# Patient Record
Sex: Female | Born: 1965
Health system: Southern US, Community
[De-identification: ages and names within clinical notes are randomized; demographics above are authoritative.]

## PROBLEM LIST (undated history)

## (undated) DIAGNOSIS — N189 Chronic kidney disease, unspecified: Secondary | ICD-10-CM

## (undated) DIAGNOSIS — F1910 Other psychoactive substance abuse, uncomplicated: Secondary | ICD-10-CM

## (undated) DIAGNOSIS — C20 Malignant neoplasm of rectum: Secondary | ICD-10-CM

## (undated) DIAGNOSIS — K7460 Unspecified cirrhosis of liver: Secondary | ICD-10-CM

## (undated) DIAGNOSIS — F1010 Alcohol abuse, uncomplicated: Secondary | ICD-10-CM

## (undated) DIAGNOSIS — F1410 Cocaine abuse, uncomplicated: Secondary | ICD-10-CM

## (undated) DIAGNOSIS — F319 Bipolar disorder, unspecified: Secondary | ICD-10-CM

## (undated) DIAGNOSIS — I10 Essential (primary) hypertension: Secondary | ICD-10-CM

## (undated) DIAGNOSIS — E039 Hypothyroidism, unspecified: Secondary | ICD-10-CM

## (undated) DIAGNOSIS — G894 Chronic pain syndrome: Secondary | ICD-10-CM

## (undated) DIAGNOSIS — F32A Depression, unspecified: Secondary | ICD-10-CM

## (undated) DIAGNOSIS — T7840XA Allergy, unspecified, initial encounter: Secondary | ICD-10-CM

## (undated) DIAGNOSIS — F329 Major depressive disorder, single episode, unspecified: Secondary | ICD-10-CM

## (undated) DIAGNOSIS — C801 Malignant (primary) neoplasm, unspecified: Secondary | ICD-10-CM

## (undated) DIAGNOSIS — M1990 Unspecified osteoarthritis, unspecified site: Secondary | ICD-10-CM

## (undated) DIAGNOSIS — Z1379 Encounter for other screening for genetic and chromosomal anomalies: Secondary | ICD-10-CM

## (undated) HISTORY — PX: MANDIBLE FRACTURE SURGERY: SHX706

## (undated) HISTORY — PX: FRACTURE SURGERY: SHX138

## (undated) HISTORY — PX: OTHER SURGICAL HISTORY: SHX169

## (undated) HISTORY — DX: Encounter for other screening for genetic and chromosomal anomalies: Z13.79

## (undated) HISTORY — PX: COLON SURGERY: SHX602

---

## 1999-11-13 ENCOUNTER — Other Ambulatory Visit: Admission: RE | Admit: 1999-11-13 | Discharge: 1999-11-13 | Payer: Self-pay | Admitting: Obstetrics

## 2000-02-02 ENCOUNTER — Inpatient Hospital Stay (HOSPITAL_COMMUNITY): Admission: AD | Admit: 2000-02-02 | Discharge: 2000-02-04 | Payer: Self-pay | Admitting: Obstetrics

## 2000-02-02 ENCOUNTER — Encounter (INDEPENDENT_AMBULATORY_CARE_PROVIDER_SITE_OTHER): Payer: Self-pay | Admitting: Specialist

## 2000-03-19 ENCOUNTER — Inpatient Hospital Stay (HOSPITAL_COMMUNITY): Admission: AD | Admit: 2000-03-19 | Discharge: 2000-03-19 | Payer: Self-pay | Admitting: Obstetrics

## 2000-06-11 ENCOUNTER — Inpatient Hospital Stay (HOSPITAL_COMMUNITY): Admission: AD | Admit: 2000-06-11 | Discharge: 2000-06-11 | Payer: Self-pay | Admitting: Obstetrics

## 2000-09-30 ENCOUNTER — Emergency Department (HOSPITAL_COMMUNITY): Admission: EM | Admit: 2000-09-30 | Discharge: 2000-09-30 | Payer: Self-pay | Admitting: Emergency Medicine

## 2000-11-18 ENCOUNTER — Inpatient Hospital Stay (HOSPITAL_COMMUNITY): Admission: AD | Admit: 2000-11-18 | Discharge: 2000-11-18 | Payer: Self-pay | Admitting: Obstetrics

## 2000-11-20 ENCOUNTER — Emergency Department (HOSPITAL_COMMUNITY): Admission: EM | Admit: 2000-11-20 | Discharge: 2000-11-20 | Payer: Self-pay | Admitting: Emergency Medicine

## 2001-02-19 ENCOUNTER — Emergency Department (HOSPITAL_COMMUNITY): Admission: EM | Admit: 2001-02-19 | Discharge: 2001-02-19 | Payer: Self-pay | Admitting: *Deleted

## 2001-04-18 ENCOUNTER — Encounter: Payer: Self-pay | Admitting: Emergency Medicine

## 2001-04-18 ENCOUNTER — Emergency Department (HOSPITAL_COMMUNITY): Admission: EM | Admit: 2001-04-18 | Discharge: 2001-04-18 | Payer: Self-pay | Admitting: Emergency Medicine

## 2001-08-14 ENCOUNTER — Emergency Department (HOSPITAL_COMMUNITY): Admission: EM | Admit: 2001-08-14 | Discharge: 2001-08-14 | Payer: Self-pay | Admitting: Emergency Medicine

## 2001-09-23 ENCOUNTER — Encounter: Payer: Self-pay | Admitting: Emergency Medicine

## 2001-09-23 ENCOUNTER — Emergency Department (HOSPITAL_COMMUNITY): Admission: EM | Admit: 2001-09-23 | Discharge: 2001-09-23 | Payer: Self-pay | Admitting: Emergency Medicine

## 2002-02-05 ENCOUNTER — Encounter: Payer: Self-pay | Admitting: Emergency Medicine

## 2002-02-05 ENCOUNTER — Inpatient Hospital Stay (HOSPITAL_COMMUNITY): Admission: EM | Admit: 2002-02-05 | Discharge: 2002-02-07 | Payer: Self-pay

## 2002-02-06 ENCOUNTER — Encounter: Payer: Self-pay | Admitting: Internal Medicine

## 2002-04-09 ENCOUNTER — Encounter: Payer: Self-pay | Admitting: Emergency Medicine

## 2002-04-09 ENCOUNTER — Emergency Department (HOSPITAL_COMMUNITY): Admission: EM | Admit: 2002-04-09 | Discharge: 2002-04-10 | Payer: Self-pay | Admitting: Emergency Medicine

## 2002-11-26 ENCOUNTER — Encounter: Payer: Self-pay | Admitting: Emergency Medicine

## 2002-11-26 ENCOUNTER — Emergency Department (HOSPITAL_COMMUNITY): Admission: EM | Admit: 2002-11-26 | Discharge: 2002-11-26 | Payer: Self-pay | Admitting: Emergency Medicine

## 2002-11-28 ENCOUNTER — Emergency Department (HOSPITAL_COMMUNITY): Admission: EM | Admit: 2002-11-28 | Discharge: 2002-11-28 | Payer: Self-pay | Admitting: Emergency Medicine

## 2002-12-20 ENCOUNTER — Emergency Department (HOSPITAL_COMMUNITY): Admission: EM | Admit: 2002-12-20 | Discharge: 2002-12-20 | Payer: Self-pay | Admitting: Emergency Medicine

## 2003-04-26 ENCOUNTER — Emergency Department (HOSPITAL_COMMUNITY): Admission: EM | Admit: 2003-04-26 | Discharge: 2003-04-26 | Payer: Self-pay | Admitting: Emergency Medicine

## 2004-03-09 ENCOUNTER — Emergency Department (HOSPITAL_COMMUNITY): Admission: EM | Admit: 2004-03-09 | Discharge: 2004-03-09 | Payer: Self-pay | Admitting: Emergency Medicine

## 2004-05-30 ENCOUNTER — Emergency Department (HOSPITAL_COMMUNITY): Admission: EM | Admit: 2004-05-30 | Discharge: 2004-05-30 | Payer: Self-pay | Admitting: Emergency Medicine

## 2004-06-23 ENCOUNTER — Emergency Department (HOSPITAL_COMMUNITY): Admission: EM | Admit: 2004-06-23 | Discharge: 2004-06-23 | Payer: Self-pay | Admitting: Internal Medicine

## 2004-06-24 ENCOUNTER — Emergency Department (HOSPITAL_COMMUNITY): Admission: EM | Admit: 2004-06-24 | Discharge: 2004-06-24 | Payer: Self-pay | Admitting: Emergency Medicine

## 2006-07-16 ENCOUNTER — Emergency Department (HOSPITAL_COMMUNITY): Admission: AC | Admit: 2006-07-16 | Discharge: 2006-07-17 | Payer: Self-pay

## 2006-10-21 ENCOUNTER — Emergency Department (HOSPITAL_COMMUNITY): Admission: EM | Admit: 2006-10-21 | Discharge: 2006-10-22 | Payer: Self-pay | Admitting: Emergency Medicine

## 2006-11-13 ENCOUNTER — Emergency Department (HOSPITAL_COMMUNITY): Admission: EM | Admit: 2006-11-13 | Discharge: 2006-11-13 | Payer: Self-pay | Admitting: Emergency Medicine

## 2007-05-30 ENCOUNTER — Emergency Department (HOSPITAL_COMMUNITY): Admission: EM | Admit: 2007-05-30 | Discharge: 2007-05-30 | Payer: Self-pay | Admitting: Emergency Medicine

## 2007-08-03 ENCOUNTER — Emergency Department (HOSPITAL_COMMUNITY): Admission: EM | Admit: 2007-08-03 | Discharge: 2007-08-03 | Payer: Self-pay | Admitting: Emergency Medicine

## 2008-03-14 ENCOUNTER — Emergency Department (HOSPITAL_COMMUNITY): Admission: EM | Admit: 2008-03-14 | Discharge: 2008-03-15 | Payer: Self-pay | Admitting: Emergency Medicine

## 2008-06-10 ENCOUNTER — Emergency Department (HOSPITAL_COMMUNITY): Admission: EM | Admit: 2008-06-10 | Discharge: 2008-06-10 | Payer: Self-pay | Admitting: Emergency Medicine

## 2008-07-12 ENCOUNTER — Emergency Department (HOSPITAL_COMMUNITY): Admission: EM | Admit: 2008-07-12 | Discharge: 2008-07-12 | Payer: Self-pay | Admitting: Emergency Medicine

## 2008-07-31 ENCOUNTER — Emergency Department (HOSPITAL_COMMUNITY): Admission: EM | Admit: 2008-07-31 | Discharge: 2008-08-01 | Payer: Self-pay | Admitting: Emergency Medicine

## 2008-10-05 ENCOUNTER — Emergency Department (HOSPITAL_COMMUNITY): Admission: EM | Admit: 2008-10-05 | Discharge: 2008-10-05 | Payer: Self-pay | Admitting: Emergency Medicine

## 2009-02-17 ENCOUNTER — Emergency Department (HOSPITAL_COMMUNITY): Admission: EM | Admit: 2009-02-17 | Discharge: 2009-02-18 | Payer: Self-pay | Admitting: Emergency Medicine

## 2009-04-19 ENCOUNTER — Emergency Department (HOSPITAL_COMMUNITY): Admission: EM | Admit: 2009-04-19 | Discharge: 2009-04-19 | Payer: Self-pay | Admitting: Emergency Medicine

## 2009-04-27 ENCOUNTER — Emergency Department (HOSPITAL_COMMUNITY): Admission: EM | Admit: 2009-04-27 | Discharge: 2009-04-27 | Payer: Self-pay | Admitting: Emergency Medicine

## 2009-06-08 ENCOUNTER — Emergency Department (HOSPITAL_COMMUNITY): Admission: EM | Admit: 2009-06-08 | Discharge: 2009-06-08 | Payer: Self-pay | Admitting: Emergency Medicine

## 2009-07-23 ENCOUNTER — Emergency Department (HOSPITAL_COMMUNITY): Admission: EM | Admit: 2009-07-23 | Discharge: 2009-07-23 | Payer: Self-pay | Admitting: Emergency Medicine

## 2009-08-20 ENCOUNTER — Emergency Department (HOSPITAL_COMMUNITY): Admission: EM | Admit: 2009-08-20 | Discharge: 2009-08-21 | Payer: Self-pay | Admitting: Emergency Medicine

## 2009-08-21 ENCOUNTER — Inpatient Hospital Stay (HOSPITAL_COMMUNITY): Admission: AD | Admit: 2009-08-21 | Discharge: 2009-08-24 | Payer: Self-pay | Admitting: Psychiatry

## 2009-08-21 ENCOUNTER — Ambulatory Visit: Payer: Self-pay | Admitting: Psychiatry

## 2009-09-03 ENCOUNTER — Emergency Department (HOSPITAL_COMMUNITY): Admission: EM | Admit: 2009-09-03 | Discharge: 2009-09-03 | Payer: Self-pay | Admitting: Emergency Medicine

## 2009-11-02 ENCOUNTER — Emergency Department (HOSPITAL_COMMUNITY): Admission: EM | Admit: 2009-11-02 | Discharge: 2009-11-03 | Payer: Self-pay | Admitting: Emergency Medicine

## 2010-11-24 ENCOUNTER — Emergency Department (HOSPITAL_COMMUNITY)
Admission: EM | Admit: 2010-11-24 | Discharge: 2010-11-24 | Payer: Self-pay | Source: Home / Self Care | Admitting: Emergency Medicine

## 2011-02-05 LAB — COMPREHENSIVE METABOLIC PANEL
ALT: 19 U/L (ref 0–35)
CO2: 27 mEq/L (ref 19–32)
Calcium: 9.1 mg/dL (ref 8.4–10.5)
GFR calc non Af Amer: >60 mL/min (ref >60)
Glucose, Bld: 88 mg/dL (ref 70–99)
Sodium: 141 mEq/L (ref 135–145)

## 2011-02-05 LAB — DIFFERENTIAL
Basophils Relative: 2 % — ABNORMAL HIGH (ref 0–1)
Eosinophils Absolute: 0.1 10*3/uL (ref 0.0–0.7)
Lymphs Abs: 1.7 10*3/uL (ref 0.7–4.0)
Neutrophils Relative %: 59 % (ref 43–77)

## 2011-02-05 LAB — LIPASE, BLOOD: Lipase: 17 U/L (ref 11–59)

## 2011-02-05 LAB — URINALYSIS, ROUTINE W REFLEX MICROSCOPIC
Bilirubin Urine: NEGATIVE
Glucose, UA: NEGATIVE mg/dL
Hgb urine dipstick: NEGATIVE
Ketones, ur: NEGATIVE mg/dL
Protein, ur: NEGATIVE mg/dL

## 2011-02-05 LAB — CBC
HCT: 30.4 % — ABNORMAL LOW (ref 36.0–46.0)
Hemoglobin: 10.3 g/dL — ABNORMAL LOW (ref 12.0–15.0)
MCHC: 33.8 g/dL (ref 30.0–36.0)
MCV: 75 fL — ABNORMAL LOW (ref 78.0–100.0)
RBC: 4.06 MIL/uL (ref 3.87–5.11)

## 2011-02-05 LAB — PREGNANCY, URINE: Preg Test, Ur: NEGATIVE

## 2011-02-05 LAB — PROTIME-INR: Prothrombin Time: 13.6 seconds (ref 11.6–15.2)

## 2011-02-06 LAB — BASIC METABOLIC PANEL
BUN: 7 mg/dL (ref 6–23)
CO2: 23 mEq/L (ref 19–32)
Chloride: 104 mEq/L (ref 96–112)
Creatinine, Ser: 0.63 mg/dL (ref 0.4–1.2)
Glucose, Bld: 113 mg/dL — ABNORMAL HIGH (ref 70–99)

## 2011-02-06 LAB — CBC
MCHC: 32.7 g/dL (ref 30.0–36.0)
MCV: 75 fL — ABNORMAL LOW (ref 78.0–100.0)
Platelets: 206 10*3/uL (ref 150–400)

## 2011-02-06 LAB — DIFFERENTIAL
Basophils Relative: 0 % (ref 0–1)
Eosinophils Absolute: 0.1 10*3/uL (ref 0.0–0.7)
Neutrophils Relative %: 37 % — ABNORMAL LOW (ref 43–77)

## 2011-02-06 LAB — RAPID URINE DRUG SCREEN, HOSP PERFORMED
Barbiturates: NOT DETECTED
Opiates: NOT DETECTED
Tetrahydrocannabinol: NOT DETECTED

## 2011-02-06 LAB — ETHANOL: Alcohol, Ethyl (B): 358 mg/dL — ABNORMAL HIGH (ref 0–10)

## 2011-02-09 LAB — HEPATIC FUNCTION PANEL
ALT: 16 U/L (ref 0–35)
AST: 34 U/L (ref 0–37)
Albumin: 3.8 g/dL (ref 3.5–5.2)
Alkaline Phosphatase: 76 U/L (ref 39–117)
Total Protein: 7.7 g/dL (ref 6.0–8.3)

## 2011-02-09 LAB — URINALYSIS, ROUTINE W REFLEX MICROSCOPIC
Glucose, UA: NEGATIVE mg/dL
Nitrite: NEGATIVE
Protein, ur: NEGATIVE mg/dL
pH: 6 (ref 5.0–8.0)

## 2011-02-09 LAB — DIFFERENTIAL
Basophils Absolute: 0 10*3/uL (ref 0.0–0.1)
Eosinophils Relative: 1 % (ref 0–5)
Lymphocytes Relative: 30 % (ref 12–46)
Lymphs Abs: 1.7 10*3/uL (ref 0.7–4.0)
Monocytes Relative: 6 % (ref 3–12)
Neutrophils Relative %: 63 % (ref 43–77)

## 2011-02-09 LAB — BASIC METABOLIC PANEL
Chloride: 103 mEq/L (ref 96–112)
Creatinine, Ser: 0.64 mg/dL (ref 0.4–1.2)
GFR calc Af Amer: >60 mL/min (ref >60)
GFR calc non Af Amer: >60 mL/min (ref >60)

## 2011-02-09 LAB — CBC
MCV: 74.1 fL — ABNORMAL LOW (ref 78.0–100.0)
Platelets: 180 10*3/uL (ref 150–400)
RBC: 4.48 MIL/uL (ref 3.87–5.11)
WBC: 5.7 10*3/uL (ref 4.0–10.5)

## 2011-02-09 LAB — LACTIC ACID, PLASMA: Lactic Acid, Venous: 2.2 mmol/L (ref 0.5–2.2)

## 2011-02-09 LAB — ABO/RH: ABO/RH(D): O POS

## 2011-02-09 LAB — TYPE AND SCREEN

## 2011-02-09 LAB — POCT PREGNANCY, URINE: Preg Test, Ur: NEGATIVE

## 2011-02-10 LAB — DIFFERENTIAL
Basophils Relative: 1 % (ref 0–1)
Lymphocytes Relative: 38 % (ref 12–46)
Lymphs Abs: 2.2 10*3/uL (ref 0.7–4.0)
Monocytes Absolute: 0.7 10*3/uL (ref 0.1–1.0)
Monocytes Relative: 12 % (ref 3–12)
Neutro Abs: 2.7 10*3/uL (ref 1.7–7.7)
Neutrophils Relative %: 47 % (ref 43–77)

## 2011-02-10 LAB — URINALYSIS, ROUTINE W REFLEX MICROSCOPIC
Leukocytes, UA: NEGATIVE
Nitrite: NEGATIVE
Specific Gravity, Urine: 1.017 (ref 1.005–1.030)
Urobilinogen, UA: 0.2 mg/dL (ref 0.0–1.0)
pH: 5.5 (ref 5.0–8.0)

## 2011-02-10 LAB — CBC
Hemoglobin: 8.6 g/dL — ABNORMAL LOW (ref 12.0–15.0)
RBC: 3.7 MIL/uL — ABNORMAL LOW (ref 3.87–5.11)
WBC: 5.8 10*3/uL (ref 4.0–10.5)

## 2011-02-10 LAB — POCT I-STAT, CHEM 8
BUN: 7 mg/dL (ref 6–23)
Chloride: 107 mEq/L (ref 96–112)
Creatinine, Ser: 0.6 mg/dL (ref 0.4–1.2)
Hemoglobin: 10.2 g/dL — ABNORMAL LOW (ref 12.0–15.0)
Potassium: 4.4 mEq/L (ref 3.5–5.1)
Sodium: 141 mEq/L (ref 135–145)

## 2011-02-10 LAB — URINE MICROSCOPIC-ADD ON

## 2011-02-12 LAB — POCT I-STAT, CHEM 8
Chloride: 107 mEq/L (ref 96–112)
Glucose, Bld: 81 mg/dL (ref 70–99)
HCT: 34 % — ABNORMAL LOW (ref 36.0–46.0)
Hemoglobin: 11.6 g/dL — ABNORMAL LOW (ref 12.0–15.0)
Potassium: 3.6 mEq/L (ref 3.5–5.1)
Sodium: 141 mEq/L (ref 135–145)

## 2011-03-21 NOTE — Discharge Summary (Signed)
[facility]  Patient:    Sherry Baker, Sherry Baker                       MRN: [id number] Adm. Date:  [id number] Disc. Date: 02/04/00 Attending:  [name]                           Discharge Summary  HISTORY OF PRESENT ILLNESS:   The patient is a 45 year old, gravida 4, para 1-0-2-1, [facility] February 09, 2000.  She was admitted in labor with a double footling breech presentation.  HOSPITAL COURSE:              She underwent primary low transverse cesarean section and tubal ligation.  She had a female, Apgars 9 and 9, weighing 6 pounds 8 ounces. Postoperatively, she did well.  Her hemoglobin on admission was 10.6 and post cesarean section was 8.6.  She was discharged home on the second postoperative ay, ambulatory and on a regular diet.  DISCHARGE MEDICATIONS:        Tylenol No.3 one q.4h. p.r.n.  FOLLOW-UP:                    She is to see me in six weeks.  DISCHARGE DIAGNOSIS:          Status post primary low transverse cesarean section and tubal ligation at term for breech presentation in labor. DD:  02/04/00 TD:  02/04/00 Job: [id number]
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
## 2011-03-21 NOTE — Discharge Summary (Signed)
[facility]  Patient:    Sherry Baker, Sherry Baker Visit Number: [id number] MRN: [id number]          Service Type: MED Location: [phone] Attending Physician:  Alfonso Ramus Dictated by:   Harrold Donath, M.D. Admit Date:  02/05/2002 Disc. Date: 02/07/02   CC:         [facility].   Discharge Summary  DISCHARGE DIAGNOSES: 1. Chest pain, resolved. 2. Polysubstance abuse.  DISCHARGE MEDICATIONS: None.  HISTORY OF PRESENT ILLNESS: The patient is a 45 year old female who presented with a 2 day history of chest pain. On admission her alcohol level was noted to be 309 and her UDS was positive for cocaine.  HOSPITAL COURSE:  #1 - CHEST PAIN: The patient was admitted to telemetry bed and was ruled out by cardiac enzymes. Her EKGs both on admission and then day #1 of hospitalization were normal sinus rhythm with no evidence of an acute MI. Her chest pain was resolved by day #1 of hospitalization and she had no further complaints.  #2 - POLYSUBSTANCE ABUSE: The patient was given information on ADS as well as homeless shelters due to the fact that she is homeless by case management. She seems very willing to receive help. She showed no evidence of withdrawal during her hospital stay and was started on an Ativan protocol. She received Ativan p.r.n. for withdrawal.  DISCHARGE CONDITION: To home in stable condition.  INSTRUCTIONS: She was told to call [facility] for an appointment. She was also encouraged to call [facility] for any rehabilitation that she might quality for. Dictated by:   Harrold Donath, M.D. Attending Physician:  Alfonso Ramus DD:  02/07/02 TD:  02/07/02 Job: [id number]

## 2011-04-02 ENCOUNTER — Emergency Department (HOSPITAL_COMMUNITY)
Admission: EM | Admit: 2011-04-02 | Discharge: 2011-04-03 | Disposition: A | Discharge status: Home / Self Care | Payer: Self-pay | Attending: Emergency Medicine | Admitting: Emergency Medicine

## 2011-04-02 DIAGNOSIS — R000 Tachycardia, unspecified: Secondary | ICD-10-CM | POA: Insufficient documentation

## 2011-04-02 DIAGNOSIS — R4789 Other speech disturbances: Secondary | ICD-10-CM | POA: Insufficient documentation

## 2011-04-02 DIAGNOSIS — F1110 Opioid abuse, uncomplicated: Secondary | ICD-10-CM | POA: Insufficient documentation

## 2011-04-02 DIAGNOSIS — F1010 Alcohol abuse, uncomplicated: Secondary | ICD-10-CM | POA: Insufficient documentation

## 2011-04-02 LAB — URINALYSIS, ROUTINE W REFLEX MICROSCOPIC
Hgb urine dipstick: NEGATIVE
Nitrite: NEGATIVE
Protein, ur: NEGATIVE mg/dL
Specific Gravity, Urine: 1.005 (ref 1.005–1.030)
Urobilinogen, UA: 0.2 mg/dL (ref 0.0–1.0)

## 2011-04-02 LAB — CBC
HCT: 33.1 % — ABNORMAL LOW (ref 36.0–46.0)
Hemoglobin: 10.6 g/dL — ABNORMAL LOW (ref 12.0–15.0)
MCHC: 32 g/dL (ref 30.0–36.0)
WBC: 3.8 10*3/uL — ABNORMAL LOW (ref 4.0–10.5)

## 2011-04-02 LAB — DIFFERENTIAL
Basophils Absolute: 0 10*3/uL (ref 0.0–0.1)
Basophils Relative: 1 % (ref 0–1)
Lymphocytes Relative: 44 % (ref 12–46)
Monocytes Absolute: 0.3 10*3/uL (ref 0.1–1.0)
Neutro Abs: 1.7 10*3/uL (ref 1.7–7.7)
Neutrophils Relative %: 46 % (ref 43–77)

## 2011-04-02 LAB — RAPID URINE DRUG SCREEN, HOSP PERFORMED
Amphetamines: NOT DETECTED
Barbiturates: NOT DETECTED
Cocaine: NOT DETECTED
Opiates: NOT DETECTED
Tetrahydrocannabinol: NOT DETECTED

## 2011-04-02 LAB — POCT I-STAT, CHEM 8
HCT: 37 % (ref 36.0–46.0)
Hemoglobin: 12.6 g/dL (ref 12.0–15.0)
Potassium: 3.4 mEq/L — ABNORMAL LOW (ref 3.5–5.1)
Sodium: 141 mEq/L (ref 135–145)
TCO2: 22 mmol/L (ref 0–100)

## 2011-04-02 LAB — POCT PREGNANCY, URINE: Preg Test, Ur: NEGATIVE

## 2011-08-01 LAB — COMPREHENSIVE METABOLIC PANEL
ALT: 45 — ABNORMAL HIGH
Alkaline Phosphatase: 70
BUN: 6
CO2: 23
GFR calc non Af Amer: >60
Glucose, Bld: 81
Potassium: 3.7
Sodium: 139
Total Bilirubin: 0.5

## 2011-08-01 LAB — LIPASE, BLOOD: Lipase: 31

## 2011-08-01 LAB — DIFFERENTIAL
Basophils Absolute: 0
Basophils Relative: 0
Eosinophils Absolute: 0
Monocytes Relative: 10
Neutro Abs: 2.2
Neutrophils Relative %: 42 — ABNORMAL LOW

## 2011-08-01 LAB — CBC
HCT: 33.1 — ABNORMAL LOW
Hemoglobin: 11.2 — ABNORMAL LOW
RBC: 4.29

## 2011-08-01 LAB — URINALYSIS, ROUTINE W REFLEX MICROSCOPIC
Glucose, UA: NEGATIVE
Hgb urine dipstick: NEGATIVE
Ketones, ur: NEGATIVE
pH: 6.5

## 2011-08-01 LAB — POCT PREGNANCY, URINE: Preg Test, Ur: NEGATIVE

## 2011-08-04 LAB — RAPID URINE DRUG SCREEN, HOSP PERFORMED
Amphetamines: NOT DETECTED
Cocaine: NOT DETECTED
Opiates: NOT DETECTED
Tetrahydrocannabinol: NOT DETECTED

## 2011-08-04 LAB — URINALYSIS, ROUTINE W REFLEX MICROSCOPIC
Bilirubin Urine: NEGATIVE
Hgb urine dipstick: NEGATIVE
Specific Gravity, Urine: 1.006
Urobilinogen, UA: 0.2
pH: 6.5

## 2011-08-04 LAB — COMPREHENSIVE METABOLIC PANEL
ALT: 103 — ABNORMAL HIGH
AST: 125 — ABNORMAL HIGH
Calcium: 8.8
GFR calc Af Amer: >60
Sodium: 142
Total Protein: 7.6

## 2011-08-04 LAB — DIFFERENTIAL
Eosinophils Absolute: 0
Eosinophils Relative: 1
Lymphs Abs: 2
Monocytes Relative: 6

## 2011-08-04 LAB — CBC
MCHC: 32.7
Platelets: 205
RDW: 20.6 — ABNORMAL HIGH

## 2011-08-04 LAB — ETHANOL: Alcohol, Ethyl (B): 180 — ABNORMAL HIGH

## 2011-08-06 LAB — COMPREHENSIVE METABOLIC PANEL
ALT: 184 — ABNORMAL HIGH
AST: 133 — ABNORMAL HIGH
Alkaline Phosphatase: 77
CO2: 24
Chloride: 108
Creatinine, Ser: 0.75
GFR calc Af Amer: >60
GFR calc non Af Amer: >60
Potassium: 3.7
Total Bilirubin: 0.5

## 2011-08-06 LAB — GLUCOSE, CAPILLARY: Glucose-Capillary: 72

## 2011-08-06 LAB — RAPID URINE DRUG SCREEN, HOSP PERFORMED
Benzodiazepines: POSITIVE — AB
Cocaine: POSITIVE — AB

## 2011-08-08 LAB — WOUND CULTURE

## 2011-08-14 LAB — CULTURE, ROUTINE-ABSCESS

## 2011-11-05 ENCOUNTER — Emergency Department (HOSPITAL_COMMUNITY)
Admission: EM | Admit: 2011-11-05 | Discharge: 2011-11-06 | Disposition: A | Discharge status: Home / Self Care | Payer: Self-pay | Attending: Internal Medicine | Admitting: Internal Medicine

## 2011-11-05 ENCOUNTER — Encounter: Payer: Self-pay | Admitting: Emergency Medicine

## 2011-11-05 DIAGNOSIS — IMO0002 Reserved for concepts with insufficient information to code with codable children: Secondary | ICD-10-CM | POA: Insufficient documentation

## 2011-11-05 DIAGNOSIS — R4789 Other speech disturbances: Secondary | ICD-10-CM | POA: Insufficient documentation

## 2011-11-05 DIAGNOSIS — F3111 Bipolar disorder, current episode manic without psychotic features, mild: Secondary | ICD-10-CM | POA: Insufficient documentation

## 2011-11-05 DIAGNOSIS — F1010 Alcohol abuse, uncomplicated: Secondary | ICD-10-CM | POA: Insufficient documentation

## 2011-11-05 DIAGNOSIS — R030 Elevated blood-pressure reading, without diagnosis of hypertension: Secondary | ICD-10-CM | POA: Insufficient documentation

## 2011-11-05 LAB — RAPID URINE DRUG SCREEN, HOSP PERFORMED
Amphetamines: NOT DETECTED
Cocaine: NOT DETECTED
Opiates: NOT DETECTED
Tetrahydrocannabinol: NOT DETECTED

## 2011-11-05 LAB — URINALYSIS, ROUTINE W REFLEX MICROSCOPIC
Bilirubin Urine: NEGATIVE
Glucose, UA: NEGATIVE mg/dL
Hgb urine dipstick: NEGATIVE
Protein, ur: NEGATIVE mg/dL
Specific Gravity, Urine: 1.006 (ref 1.005–1.030)
Urobilinogen, UA: 0.2 mg/dL (ref 0.0–1.0)

## 2011-11-05 LAB — CBC
MCH: 22.6 pg — ABNORMAL LOW (ref 26.0–34.0)
MCHC: 30.8 g/dL (ref 30.0–36.0)
MCV: 73.4 fL — ABNORMAL LOW (ref 78.0–100.0)
Platelets: 137 10*3/uL — ABNORMAL LOW (ref 150–400)
RDW: 20.5 % — ABNORMAL HIGH (ref 11.5–15.5)

## 2011-11-05 LAB — COMPREHENSIVE METABOLIC PANEL
ALT: 43 U/L — ABNORMAL HIGH (ref 0–35)
AST: 75 U/L — ABNORMAL HIGH (ref 0–37)
Calcium: 8.9 mg/dL (ref 8.4–10.5)
Creatinine, Ser: 0.49 mg/dL — ABNORMAL LOW (ref 0.50–1.10)
Sodium: 138 mEq/L (ref 135–145)
Total Protein: 7.6 g/dL (ref 6.0–8.3)

## 2011-11-05 LAB — ACETAMINOPHEN LEVEL: Acetaminophen (Tylenol), Serum: <15 ug/mL (ref 10–30)

## 2011-11-05 NOTE — ED Notes (Signed)
Pt alert, presents via [employer], recent death in family, hx depression, stress, denies SI/HI, IVC per [employer]
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
## 2011-11-05 NOTE — ED Notes (Signed)
[employer] x3 at bedside - pt reluctant to change to paper scrubs.

## 2011-11-06 MED ORDER — NICOTINE 21 MG/24HR TD PT24
21.0000 mg | MEDICATED_PATCH | Freq: Every day | TRANSDERMAL | Status: DC
  Filled 2011-11-06: qty 1

## 2011-11-06 MED ORDER — ZIPRASIDONE HCL 40 MG PO CAPS: 40.0000 mg | ORAL_CAPSULE | Freq: Two times a day (BID) | ORAL | Status: DC

## 2011-11-06 MED ORDER — LORAZEPAM 1 MG PO TABS: 1.0000 mg | ORAL_TABLET | Freq: Four times a day (QID) | ORAL | Status: AC | PRN

## 2011-11-06 MED ORDER — ALUM & MAG HYDROXIDE-SIMETH 200-200-20 MG/5ML PO SUSP: 30.0000 mL | ORAL | Status: DC | PRN

## 2011-11-06 MED ORDER — IBUPROFEN 600 MG PO TABS: 600.0000 mg | ORAL_TABLET | Freq: Three times a day (TID) | ORAL | Status: DC | PRN

## 2011-11-06 MED ORDER — ONDANSETRON HCL 4 MG PO TABS: 4.0000 mg | ORAL_TABLET | Freq: Three times a day (TID) | ORAL | Status: DC | PRN

## 2011-11-06 MED ORDER — ZIPRASIDONE HCL 20 MG PO CAPS
40.0000 mg | ORAL_CAPSULE | Freq: Two times a day (BID) | ORAL | Status: DC
  Administered 2011-11-06: 40 mg via ORAL
  Filled 2011-11-06: qty 2

## 2011-11-06 MED ORDER — LORAZEPAM 1 MG PO TABS
2.0000 mg | ORAL_TABLET | Freq: Four times a day (QID) | ORAL | Status: DC | PRN
  Administered 2011-11-06: 2 mg via ORAL
  Filled 2011-11-06: qty 2

## 2011-11-06 NOTE — Progress Notes (Signed)
Rx for ativan and geodon approved and tubed to [facility] pharmacy.  Clovis Fredrickson, RN to be contacted when ready at [phone].
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
## 2011-11-06 NOTE — ED Notes (Signed)
Case Management fills Geodon prescription and sends paper Rx for Ativan home with patient. Meds carefully reviewed with patient by this RN, specifying dosage amounts and times. Pt instructed to NOT take Ativan if she begins drinking again. Departs in stable condition.

## 2011-11-06 NOTE — ED Notes (Signed)
Pt. Sitting in bed watching tv.
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
## 2011-11-06 NOTE — ED Notes (Signed)
Offered pt. A shower, delclined at this time.

## 2011-11-06 NOTE — Progress Notes (Signed)
ED CM contacted by ED SW about medication assistance. Noted self pay pt.  Spoke with Herbert Seta at [facility] pharmacy about [employer]. Pt is eligible for medication assistance. Pending Rx (s) from EDP for processing

## 2011-11-06 NOTE — ED Notes (Signed)
Pt. A/O sitting up in bed watching tv/

## 2011-11-06 NOTE — ED Provider Notes (Signed)
Medical screening examination/treatment/procedure(s) were performed by non-physician practitioner and as supervising physician I was immediately available for consultation/collaboration.  Jasmine Awe, MD 11/06/11 [phone]

## 2011-11-06 NOTE — ED Notes (Signed)
Pt has completed tele-psych consult which has recommended discharge home and rescinded IVC papers. EDP notified and is in agreement with disposition. RN made aware. Pt given community mental health and substance abuse resources. No further needs identified at this time.

## 2011-11-06 NOTE — ED Provider Notes (Signed)
The patient was reassessed after psychiatric consultation, telemetry. Recommendation was to disccharge her on Geodon. The patient is slightly anxious. Now with hypertension that is new since arriving to the emergency department. Review of evaluation indicates that she had no evident renal disease, altered mental status concerning for intracranial abnormality, and no metabolic instability. She is not in hypertensive crisis. Her blood pressure can be managed as an outpatient, expectantly. Consideration was given for potential impending DTs. The patient has never had DTs in the past. At this point. She does not have altered mental status is lucid, and cooperative. She will be additionally, treated with Ativan for anxiety, and given instructions to return for worsening symptoms that could be indicative of DTs. Patient is comfortable with this plan and understands implications. She is lucid at the time of discharge. We were able to secure her 10 days worth of Geodon prescription for her from the outpatient pharmacy, at no cost to her. She is additionally, given a prescription for Ativan, #20, 1 mg to use 3 times a day when necessary anxiety.  Flint Melter, MD 11/06/11 [phone]

## 2011-11-06 NOTE — ED Notes (Signed)
Pt. In the shower .
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
## 2011-11-23 ENCOUNTER — Encounter (HOSPITAL_COMMUNITY): Payer: Self-pay | Admitting: Emergency Medicine

## 2011-11-23 ENCOUNTER — Other Ambulatory Visit: Payer: Self-pay

## 2011-11-23 ENCOUNTER — Emergency Department (HOSPITAL_COMMUNITY)
Admission: EM | Admit: 2011-11-23 | Discharge: 2011-11-23 | Disposition: A | Discharge status: Home / Self Care | Payer: Self-pay | Attending: Emergency Medicine | Admitting: Emergency Medicine

## 2011-11-23 ENCOUNTER — Emergency Department (HOSPITAL_COMMUNITY): Payer: Self-pay

## 2011-11-23 DIAGNOSIS — R209 Unspecified disturbances of skin sensation: Secondary | ICD-10-CM | POA: Insufficient documentation

## 2011-11-23 DIAGNOSIS — F411 Generalized anxiety disorder: Secondary | ICD-10-CM | POA: Insufficient documentation

## 2011-11-23 DIAGNOSIS — R0789 Other chest pain: Secondary | ICD-10-CM | POA: Insufficient documentation

## 2011-11-23 DIAGNOSIS — Z79899 Other long term (current) drug therapy: Secondary | ICD-10-CM | POA: Insufficient documentation

## 2011-11-23 DIAGNOSIS — M79603 Pain in arm, unspecified: Secondary | ICD-10-CM

## 2011-11-23 DIAGNOSIS — M79609 Pain in unspecified limb: Secondary | ICD-10-CM | POA: Insufficient documentation

## 2011-11-23 DIAGNOSIS — I10 Essential (primary) hypertension: Secondary | ICD-10-CM | POA: Insufficient documentation

## 2011-11-23 HISTORY — DX: Essential (primary) hypertension: I10

## 2011-11-23 LAB — DIFFERENTIAL
Basophils Relative: 0 % (ref 0–1)
Eosinophils Absolute: 0.1 10*3/uL (ref 0.0–0.7)
Eosinophils Relative: 2 % (ref 0–5)
Neutrophils Relative %: 53 % (ref 43–77)

## 2011-11-23 LAB — BASIC METABOLIC PANEL
BUN: 5 mg/dL — ABNORMAL LOW (ref 6–23)
Calcium: 9.3 mg/dL (ref 8.4–10.5)
GFR calc Af Amer: >90 mL/min (ref >90)
GFR calc non Af Amer: >90 mL/min (ref >90)
Potassium: 3.2 mEq/L — ABNORMAL LOW (ref 3.5–5.1)
Sodium: 132 mEq/L — ABNORMAL LOW (ref 135–145)

## 2011-11-23 LAB — POCT I-STAT, CHEM 8
BUN: <3 mg/dL — ABNORMAL LOW (ref 6–23)
Calcium, Ion: 1.12 mmol/L (ref 1.12–1.32)
Creatinine, Ser: 0.8 mg/dL (ref 0.50–1.10)
Hemoglobin: 13.3 g/dL (ref 12.0–15.0)
Sodium: 136 mEq/L (ref 135–145)
TCO2: 24 mmol/L (ref 0–100)

## 2011-11-23 LAB — D-DIMER, QUANTITATIVE: D-Dimer, Quant: 0.34 ug/mL-FEU (ref 0.00–0.48)

## 2011-11-23 LAB — CBC
MCH: 23.8 pg — ABNORMAL LOW (ref 26.0–34.0)
MCHC: 32.1 g/dL (ref 30.0–36.0)
MCV: 74.2 fL — ABNORMAL LOW (ref 78.0–100.0)
Platelets: 199 10*3/uL (ref 150–400)
RDW: 19.4 % — ABNORMAL HIGH (ref 11.5–15.5)

## 2011-11-23 LAB — POCT I-STAT TROPONIN I

## 2011-11-23 MED ORDER — POTASSIUM CHLORIDE 20 MEQ/15ML (10%) PO LIQD
ORAL | Status: AC
  Filled 2011-11-23: qty 30

## 2011-11-23 MED ORDER — POTASSIUM CHLORIDE 20 MEQ/15ML (10%) PO LIQD
40.0000 meq | Freq: Once | ORAL | Status: AC
  Administered 2011-11-23: 40 meq via ORAL

## 2011-11-23 MED ORDER — LORAZEPAM 2 MG/ML IJ SOLN
2.0000 mg | Freq: Once | INTRAMUSCULAR | Status: AC
  Administered 2011-11-23: 2 mg via INTRAVENOUS
  Filled 2011-11-23: qty 1

## 2011-11-23 MED ORDER — TRAMADOL HCL 50 MG PO TABS: 50.0000 mg | ORAL_TABLET | Freq: Four times a day (QID) | ORAL | Status: AC | PRN

## 2011-11-23 MED ORDER — IBUPROFEN 600 MG PO TABS: 600.0000 mg | ORAL_TABLET | Freq: Four times a day (QID) | ORAL | Status: AC | PRN

## 2011-11-23 MED ORDER — KETOROLAC TROMETHAMINE 30 MG/ML IJ SOLN
30.0000 mg | Freq: Once | INTRAMUSCULAR | Status: AC
Start: 2011-11-23 — End: 2011-11-23
  Administered 2011-11-23: 30 mg via INTRAVENOUS
  Filled 2011-11-23: qty 1

## 2011-11-23 MED ORDER — SODIUM CHLORIDE 0.9 % IV BOLUS (SEPSIS)
1000.0000 mL | Freq: Once | INTRAVENOUS | Status: AC
  Administered 2011-11-23: 1000 mL via INTRAVENOUS

## 2011-11-23 MED ORDER — MORPHINE SULFATE 4 MG/ML IJ SOLN
4.0000 mg | Freq: Once | INTRAMUSCULAR | Status: AC
  Administered 2011-11-23: 4 mg via INTRAVENOUS
  Filled 2011-11-23: qty 1

## 2011-11-23 NOTE — ED Notes (Signed)
Pt  Stated that she has been having midsternal chest pain radiating to left arm x2 weeks. She is currently complaining pf pain 10 out of 10. No SOB, n/v or diaphoresis. Heart sounds WNL. Pt stated that she has had increase anxiety and stress d/t father being ill. Pt is currently tearful. EDP at bedside. Will continue to monitor.

## 2011-11-23 NOTE — ED Notes (Signed)
Patient transported to X-ray 

## 2011-11-23 NOTE — ED Provider Notes (Signed)
History     CSN: [id number]  Arrival date & time 11/23/11  1656   First MD Initiated Contact with Patient 11/23/11 1738      Chief Complaint  Patient presents with  . Arm Pain    (Consider location/radiation/quality/duration/timing/severity/associated sxs/prior treatment) HPI  46 year old female with history of hypertension and anxiety is presenting to the ED with a chief complaints of left-sided chest pain and pain radiating to her left arm. Patient states for the past 2 weeks she has been experiencing constant sharp pain to her chest and arm. She also noticed tingling sensation to the tip of her fingers. She denies headache, lightheadedness, shortness of breath, back pain, nausea, vomiting, diarrhea, abdominal pain, dysuria, rash. She denies anything that precipitated her pain but states she does a lot of house work. Patient has tried taking ibuprofen once  with some relief.  She denies any recreational drug use but does admits to drinking one beer earlier today.  Patient denies shortness of breath, pleuritic chest pain, calf pain, all neck swelling. She does not taking birth control by mouth.  She denies any recent surgery or prolonged bed rest.  Past Medical History  Diagnosis Date  . Hypertension     History reviewed. No pertinent past surgical history.  No family history on file.  History  Substance Use Topics  . Smoking status: Current Everyday Smoker -- 1.0 packs/day    Types: Cigarettes  . Smokeless tobacco: Not on file  . Alcohol Use: Yes    OB History    Grav Para Term Preterm Abortions TAB SAB Ect Mult Living                  Review of Systems  All other systems reviewed and are negative.    Allergies  Penicillins  Home Medications   Current Outpatient Rx  Name Route Sig Dispense Refill  . LORAZEPAM 1 MG PO TABS Oral Take 1 mg by mouth every 6 (six) hours as needed. For anxiety    . ZIPRASIDONE HCL 40 MG PO CAPS Oral Take 1 capsule (40 mg total) by  mouth 2 (two) times daily with a meal. 20 capsule 0    BP 131/94  Pulse 130  Temp(Src) 98.1 F (36.7 C) (Oral)  Resp 18  SpO2 97%  Physical Exam  Nursing note and vitals reviewed. Constitutional:       Awake, alert, nontoxic appearance  HENT:  Head: Atraumatic.  Eyes: Right eye exhibits no discharge. Left eye exhibits no discharge.  Neck: Neck supple.  Pulmonary/Chest: Effort normal. She exhibits no tenderness.  Abdominal: There is no tenderness. There is no rebound.  Musculoskeletal: She exhibits no tenderness.       Right shoulder: Normal.       Left shoulder: Normal.       Right upper arm: Normal.       Left upper arm: Normal. She exhibits no tenderness.       Baseline ROM, no obvious new focal weakness  Neurological:       Mental status and motor strength appears baseline for patient and situation  Skin: No rash noted.     Psychiatric: She has a normal mood and affect.    ED Course  Procedures (including critical care time)   Labs Reviewed  I-STAT TROPONIN I  CBC  DIFFERENTIAL  BASIC METABOLIC PANEL  POCT I-STAT TROPONIN I   No results found.   No diagnosis found.  7:05 PM  Date: 11/23/2011  Rate: 131  Rhythm: sinus tachycardia  QRS Axis: normal  Intervals: normal  ST/T Wave abnormalities: nonspecific ST changes  Conduction Disutrbances:none  Narrative Interpretation:   Old EKG Reviewed: no significant changes    MDM  This is a patient with anxiety, and hypertension presenting with 2 weeks onset left chest pain and arm pain. Her pain does not suggest cardio pulmonary etiology. Her pain is worsened with movement and with palpation. She is PERC negative.  Low suspicion for PE. However, her pulse is markedly elevated. With a history of anxiety, IV Ativan given along with pain medication. Workup initiated.  7:00 PM Potassium is 3.2, potassium supplementation given in the ED. Sodium 132, IV normal saline given. Patient has negative troponin and normal  chest x-ray. Her heart rate has decreased to baseline. Patient has a negative d-dimer.   8:38 PM Patient state pain has resolved after receiving pain medication. Reassurance given. Patient will be discharged with a short course of pain medication. Patient is instructed for strict followup. Patient voiced understanding and agree with plan.  Fayrene Helper, PA-C 11/23/11 2040

## 2011-11-23 NOTE — ED Provider Notes (Signed)
Medical screening examination/treatment/procedure(s) were conducted as a shared visit with non-physician practitioner(s) and myself.  I personally evaluated the patient during the encounter   Dione Booze, MD 11/23/11 2338

## 2011-11-23 NOTE — ED Notes (Signed)
C/o L arm pain x 2 weeks.  Pt states pain worse with movement.  Pt states she is also having pain in L side of chest.  Denies sob, nausea, and vomiting.

## 2011-11-23 NOTE — ED Provider Notes (Signed)
46 year old female comes in complaining of a two-week history of constant left-sided chest pain. She describes it as sharp and is better she holds her left arm up overhead and worse if she lifts her left arm down. She denies any change in her baseline dyspnea and denies any nausea or vomiting. She has had sweats. She denies any change in chronic cough. Her exam is significant for mild to moderate left-sided chest wall tenderness. She's also noted to be tachycardic. She is somewhat of a poor historian, but given her history of sharp chest pain and tachycardia, she will need to be screened for pulmonary embolism.  Dione Booze, MD 11/23/11 [phone]

## 2012-01-09 ENCOUNTER — Encounter (HOSPITAL_COMMUNITY): Payer: Self-pay

## 2012-01-09 ENCOUNTER — Emergency Department (HOSPITAL_COMMUNITY)
Admission: EM | Admit: 2012-01-09 | Discharge: 2012-01-09 | Disposition: A | Discharge status: Home / Self Care | Payer: Self-pay | Attending: Emergency Medicine | Admitting: Emergency Medicine

## 2012-01-09 DIAGNOSIS — I10 Essential (primary) hypertension: Secondary | ICD-10-CM | POA: Insufficient documentation

## 2012-01-09 DIAGNOSIS — F10229 Alcohol dependence with intoxication, unspecified: Secondary | ICD-10-CM | POA: Insufficient documentation

## 2012-01-09 DIAGNOSIS — IMO0002 Reserved for concepts with insufficient information to code with codable children: Secondary | ICD-10-CM

## 2012-01-09 DIAGNOSIS — Z880 Allergy status to penicillin: Secondary | ICD-10-CM | POA: Insufficient documentation

## 2012-01-09 DIAGNOSIS — K7030 Alcoholic cirrhosis of liver without ascites: Secondary | ICD-10-CM | POA: Insufficient documentation

## 2012-01-09 DIAGNOSIS — F17200 Nicotine dependence, unspecified, uncomplicated: Secondary | ICD-10-CM | POA: Insufficient documentation

## 2012-01-09 HISTORY — DX: Unspecified cirrhosis of liver: K74.60

## 2012-01-09 HISTORY — DX: Alcohol abuse, uncomplicated: F10.10

## 2012-01-09 LAB — URINALYSIS, ROUTINE W REFLEX MICROSCOPIC
Bilirubin Urine: NEGATIVE
Glucose, UA: NEGATIVE mg/dL
Ketones, ur: NEGATIVE mg/dL
Protein, ur: NEGATIVE mg/dL
pH: 6 (ref 5.0–8.0)

## 2012-01-09 NOTE — ED Notes (Signed)
AOZ:[id number]<HQ> Expected date:<BR> Expected time: 4:37 PM<BR> Means of arrival:<BR> Comments:<BR> M11 - 46yoF etoh
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
## 2012-01-09 NOTE — ED Provider Notes (Signed)
History     CSN: [id number]  Arrival date & time 01/09/12  1646   None     Chief Complaint  Patient presents with  . Depression  . Alcohol Intoxication    (Consider location/radiation/quality/duration/timing/severity/associated sxs/prior treatment) Patient is a 46 y.o. female presenting with intoxication. The history is provided by the patient. No language interpreter was used.  Alcohol Intoxication This is a recurrent problem. The current episode started today. The problem occurs constantly. The problem has been gradually improving. Pertinent negatives include no abdominal pain, chest pain, coughing, diaphoresis, headaches, nausea, neck pain, sore throat, swollen glands, urinary symptoms, vertigo, visual change or vomiting. The symptoms are aggravated by drinking. She has tried nothing for the symptoms.   patient was found lying on the curb intoxicated when the EMS picked her. She has been here 3 hours and has eaten and drank. She is able to walk a straight line. She denies suicidal or homicidal ideation. She does not want help with her alcohol problem today. She will a bus pass unlikely. We discussed the situation and she is going to have someone pick her up.  Past Medical History  Diagnosis Date  . Hypertension   . Alcohol abuse   . Cirrhosis of liver     Past Surgical History  Procedure Date  . Mandible fracture surgery     No family history on file.  History  Substance Use Topics  . Smoking status: Current Everyday Smoker -- 1.0 packs/day    Types: Cigarettes  . Smokeless tobacco: Not on file  . Alcohol Use: Yes    OB History    Grav Para Term Preterm Abortions TAB SAB Ect Mult Living                  Review of Systems  Constitutional: Negative for diaphoresis.  HENT: Negative for sore throat and neck pain.   Respiratory: Negative for cough.   Cardiovascular: Negative for chest pain.  Gastrointestinal: Negative for nausea, vomiting and abdominal pain.    Neurological: Negative for vertigo and headaches.  All other systems reviewed and are negative.    Allergies  Penicillins  Home Medications   Current Outpatient Rx  Name Route Sig Dispense Refill  . LORAZEPAM 1 MG PO TABS Oral Take 1 mg by mouth every 6 (six) hours as needed. For anxiety    . ZIPRASIDONE HCL 40 MG PO CAPS Oral Take 1 capsule (40 mg total) by mouth 2 (two) times daily with a meal. 20 capsule 0    There were no vitals taken for this visit.  Physical Exam  Nursing note and vitals reviewed. Constitutional: She is oriented to person, place, and time. She appears well-developed and well-nourished.  HENT:  Head: Normocephalic and atraumatic.  Eyes: Conjunctivae and EOM are normal. Pupils are equal, round, and reactive to light.  Neck: Normal range of motion. Neck supple.  Cardiovascular: Regular rhythm, normal heart sounds and intact distal pulses.   Pulmonary/Chest: Effort normal and breath sounds normal. No respiratory distress. She has no wheezes.  Abdominal: Soft. Bowel sounds are normal.  Musculoskeletal: Normal range of motion.  Neurological: She is alert and oriented to person, place, and time.  Skin: Skin is warm and dry.  Psychiatric: She has a normal mood and affect.    ED Course  Procedures (including critical care time)   Labs Reviewed  URINALYSIS, ROUTINE W REFLEX MICROSCOPIC  PREGNANCY, URINE  URINE RAPID DRUG SCREEN (HOSP PERFORMED)   No results  found.   No diagnosis found.    MDM   46yo female here for alcohol intoxication in public. pmh of hypertension, ETOH abuse and cirrhosis of the liver.  Bystander called EMS because she was laying on a curb. Wants to leave after 4 hours in the ER.  No slurred speech and she is walking a straight line. Eating and drinking in the ER.  Cannot find a ride.  Wants to take the bus.  States she has a safe place to go.  I feel she is sober and is at baseline and able to conduct herself.       Jethro Bastos, NP 01/10/12 1118
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
## 2012-01-10 NOTE — ED Provider Notes (Signed)
Medical screening examination/treatment/procedure(s) were performed by non-physician practitioner and as supervising physician I was immediately available for consultation/collaboration.   Leigh-Ann Joceline Hinchcliff, MD 01/10/12 1553 

## 2012-03-18 ENCOUNTER — Encounter (HOSPITAL_COMMUNITY): Payer: Self-pay | Admitting: Emergency Medicine

## 2012-03-18 ENCOUNTER — Emergency Department (HOSPITAL_COMMUNITY)
Admission: EM | Admit: 2012-03-18 | Discharge: 2012-03-19 | Disposition: A | Discharge status: Home / Self Care | Payer: Self-pay | Attending: Emergency Medicine | Admitting: Emergency Medicine

## 2012-03-18 DIAGNOSIS — K7460 Unspecified cirrhosis of liver: Secondary | ICD-10-CM | POA: Insufficient documentation

## 2012-03-18 DIAGNOSIS — E876 Hypokalemia: Secondary | ICD-10-CM | POA: Insufficient documentation

## 2012-03-18 DIAGNOSIS — K921 Melena: Secondary | ICD-10-CM | POA: Insufficient documentation

## 2012-03-18 DIAGNOSIS — I10 Essential (primary) hypertension: Secondary | ICD-10-CM | POA: Insufficient documentation

## 2012-03-18 DIAGNOSIS — R300 Dysuria: Secondary | ICD-10-CM | POA: Insufficient documentation

## 2012-03-18 DIAGNOSIS — F17200 Nicotine dependence, unspecified, uncomplicated: Secondary | ICD-10-CM | POA: Insufficient documentation

## 2012-03-18 DIAGNOSIS — K644 Residual hemorrhoidal skin tags: Secondary | ICD-10-CM | POA: Insufficient documentation

## 2012-03-18 DIAGNOSIS — K625 Hemorrhage of anus and rectum: Secondary | ICD-10-CM | POA: Insufficient documentation

## 2012-03-18 NOTE — ED Notes (Signed)
[id number]<AV> Expected date:<BR> Expected time:10:55 PM<BR> Means of arrival:<BR> Comments:<BR> M211 - 46yoF Rectal bleeding and painful urination

## 2012-03-18 NOTE — ED Notes (Signed)
As per EMS, pt called ems for chest pain, pt then c/o pain when urinating and blood seen in stool. Pt states the urinating  problem has been going on for years. EMS suspects ETOH

## 2012-03-19 LAB — BASIC METABOLIC PANEL
BUN: 7 mg/dL (ref 6–23)
Calcium: 8.7 mg/dL (ref 8.4–10.5)
Creatinine, Ser: 0.48 mg/dL — ABNORMAL LOW (ref 0.50–1.10)
GFR calc non Af Amer: >90 mL/min (ref >90)
Glucose, Bld: 85 mg/dL (ref 70–99)
Potassium: 3.3 mEq/L — ABNORMAL LOW (ref 3.5–5.1)

## 2012-03-19 LAB — CBC
HCT: 32.4 % — ABNORMAL LOW (ref 36.0–46.0)
Hemoglobin: 10.4 g/dL — ABNORMAL LOW (ref 12.0–15.0)
MCH: 23.5 pg — ABNORMAL LOW (ref 26.0–34.0)
MCHC: 32.1 g/dL (ref 30.0–36.0)
MCV: 73.1 fL — ABNORMAL LOW (ref 78.0–100.0)

## 2012-03-19 LAB — URINALYSIS, DIPSTICK ONLY
Bilirubin Urine: NEGATIVE
Glucose, UA: NEGATIVE mg/dL
Hgb urine dipstick: NEGATIVE
Ketones, ur: NEGATIVE mg/dL
pH: 6 (ref 5.0–8.0)

## 2012-03-19 LAB — URINALYSIS, ROUTINE W REFLEX MICROSCOPIC
Bilirubin Urine: NEGATIVE
Hgb urine dipstick: NEGATIVE
Specific Gravity, Urine: 1.01 (ref 1.005–1.030)
Urobilinogen, UA: 0.2 mg/dL (ref 0.0–1.0)

## 2012-03-19 MED ORDER — POTASSIUM CHLORIDE CRYS ER 20 MEQ PO TBCR
40.0000 meq | EXTENDED_RELEASE_TABLET | Freq: Once | ORAL | Status: AC
  Administered 2012-03-19: 40 meq via ORAL
  Filled 2012-03-19: qty 2

## 2012-03-19 MED ORDER — ACETAMINOPHEN 325 MG PO TABS
650.0000 mg | ORAL_TABLET | Freq: Once | ORAL | Status: AC
  Administered 2012-03-19: 650 mg via ORAL
  Filled 2012-03-19: qty 2

## 2012-03-19 NOTE — ED Provider Notes (Signed)
History     CSN: [id number]  Arrival date & time 03/18/12  2258   None     Chief Complaint  Patient presents with  . Rectal Bleeding    (Consider location/radiation/quality/duration/timing/severity/associated sxs/prior treatment) Patient is a 46 y.o. female presenting with hematochezia. The history is provided by the patient. No language interpreter was used.  Rectal Bleeding  The current episode started more than 2 weeks ago (1 year). The problem occurs occasionally. The problem has been unchanged. The patient is experiencing no pain. The stool is described as soft. There was no prior successful therapy. There was no prior unsuccessful therapy. Associated symptoms include hemorrhoids. Pertinent negatives include no fever, no abdominal pain, no diarrhea, no nausea, no rectal pain, no vomiting, no hematuria and no chest pain. Her past medical history does not include inflammatory bowel disease or recent antibiotic use. Recently, medical care has been given at this facility.   Rectal bleeding today x 3 from her hemmeroids  That has been going on for over 1 year.  Also having burning with urination x several years.  Arrived via ems.  Smells of etoh.  Boyfriend here had argument and left.  No acute distress, dizziness or weakness.  Does not look toxic. pmh bipolar , hypertension, etoh abuse, and cirrhosis of the liver.  Past Medical History  Diagnosis Date  . Hypertension   . Alcohol abuse   . Cirrhosis of liver     Past Surgical History  Procedure Date  . Mandible fracture surgery     No family history on file.  History  Substance Use Topics  . Smoking status: Current Everyday Smoker -- 1.0 packs/day for 10 years    Types: Cigarettes  . Smokeless tobacco: Not on file  . Alcohol Use: Yes    OB History    Grav Para Term Preterm Abortions TAB SAB Ect Mult Living                  Review of Systems  Constitutional: Negative.  Negative for fever.  HENT: Negative.   Eyes:  Negative.   Respiratory: Negative.  Negative for shortness of breath.   Cardiovascular: Negative.  Negative for chest pain and leg swelling.  Gastrointestinal: Positive for blood in stool, hematochezia, anal bleeding and hemorrhoids. Negative for nausea, vomiting, abdominal pain, diarrhea, constipation and rectal pain.  Genitourinary: Positive for dysuria. Negative for frequency and hematuria.  Neurological: Negative.  Negative for dizziness and weakness.  Psychiatric/Behavioral: Negative.        Intoixicated   All other systems reviewed and are negative.    Allergies  Penicillins  Home Medications  No current outpatient prescriptions on file.  BP 123/84  Pulse 100  Temp(Src) 97.7 F (36.5 C) (Oral)  Resp 22  SpO2 94%  Physical Exam  Nursing note and vitals reviewed. Constitutional: She is oriented to person, place, and time. She appears well-developed and well-nourished.  HENT:  Head: Normocephalic.  Eyes: Conjunctivae and EOM are normal. Pupils are equal, round, and reactive to light.  Neck: Normal range of motion. Neck supple.  Cardiovascular: Normal rate.   Pulmonary/Chest: Effort normal and breath sounds normal. No respiratory distress. She has no wheezes.  Abdominal: Soft.  Genitourinary: Rectal exam shows external hemorrhoid. Rectal exam shows no mass, no tenderness and anal tone normal. Guaiac negative stool.  Musculoskeletal: Normal range of motion.  Neurological: She is alert and oriented to person, place, and time.  Skin: Skin is warm and dry.  Psychiatric:  She has a normal mood and affect.    ED Course  Procedures (including critical care time)   Labs Reviewed  URINALYSIS, DIPSTICK ONLY  CBC  BASIC METABOLIC PANEL   No results found.   No diagnosis found.    MDM  Arrived EMS c/o rectal bleeding and dysuria.  Smells of ETOH. Hypokalemia 3.3. Hgb 10.4. Denies dizziness. Ambulating without difficulty.  No UTI. Boyfriend to pick her up. Pt is ready  for discharge.   Labs Reviewed  CBC - Abnormal; Notable for the following:    WBC 3.3 (*)    Hemoglobin 10.4 (*)    HCT 32.4 (*)    MCV 73.1 (*)    MCH 23.5 (*)    RDW 21.2 (*)    Platelets 132 (*)    All other components within normal limits  BASIC METABOLIC PANEL - Abnormal; Notable for the following:    Potassium 3.3 (*)    Creatinine, Ser 0.48 (*)    All other components within normal limits  URINALYSIS, DIPSTICK ONLY  URINALYSIS, ROUTINE W REFLEX MICROSCOPIC  OCCULT BLOOD, POC DEVICE  OCCULT BLOOD X 1 CARD TO LAB, STOOL         Remi Haggard, NP 03/19/12 1623

## 2012-03-19 NOTE — ED Notes (Signed)
Pt refusing to speak to me or answer any questions. Pt alert but will not give me an arm to stick. RN aware.

## 2012-03-19 NOTE — Discharge Instructions (Signed)
Rectal Bleeding  Rectal bleeding is when blood comes out of the opening of the butt (anus). Rectal bleeding may show up as bright red blood or really dark poop (stool). The poop may look dark red, maroon, or black. Rectal bleeding is often a sign that something is wrong. This needs to be checked by a doctor.  HOME CARE  Eat a diet high in fiber. This will help keep your poop soft.   Limit activitiy.   Drink enough fluids to keep your pee (urine) clear or pale yellow.   Take a warm bath to soothe any pain.   Follow up with your doctor as told.  GET HELP RIGHT AWAY IF:  You have more bleeding.   You have black or dark red poop.   You throw up (vomit) blood or it looks like coffee grounds.   You have belly (abdominal) pain or tenderness.   You have a fever.   You feel weak, sick to your stomach (nauseous), or you pass out (faint).   You have pain that is so bad you cannot poop (bowel movement).  MAKE SURE YOU:  Understand these instructions.   Will watch your condition.   Will get help right away if you are not doing well or get worse.  Document Released: 07/02/2011 Document Revised: 10/09/2011 Document Reviewed: 07/02/2011 Exit[employer] Patient Information 2012 [employer]. 

## 2012-03-22 NOTE — ED Provider Notes (Signed)
Medical screening examination/treatment/procedure(s) were performed by non-physician practitioner and as supervising physician I was immediately available for consultation/collaboration.   Hanley Seamen, MD 03/22/12 2248

## 2012-04-06 ENCOUNTER — Encounter (HOSPITAL_COMMUNITY): Payer: Self-pay | Admitting: *Deleted

## 2012-04-06 ENCOUNTER — Emergency Department (HOSPITAL_COMMUNITY)
Admission: EM | Admit: 2012-04-06 | Discharge: 2012-04-06 | Discharge status: Against Medical Advice | Payer: Self-pay | Attending: Emergency Medicine | Admitting: Emergency Medicine

## 2012-04-06 DIAGNOSIS — F1010 Alcohol abuse, uncomplicated: Secondary | ICD-10-CM | POA: Insufficient documentation

## 2012-04-06 DIAGNOSIS — M79609 Pain in unspecified limb: Secondary | ICD-10-CM | POA: Insufficient documentation

## 2012-04-06 NOTE — ED Notes (Signed)
PER EMS:  Pt c/o right leg pain, states she has "spider bite" on right upper thigh.  States was recently at [facility].  ETOH, admits to "3 forties" tonight.

## 2012-04-06 NOTE — ED Notes (Signed)
Called for patient x 3, no answer in triage nor in wait,  D/c

## 2012-08-01 ENCOUNTER — Encounter (HOSPITAL_COMMUNITY): Payer: Self-pay | Admitting: Emergency Medicine

## 2012-08-01 ENCOUNTER — Emergency Department (HOSPITAL_COMMUNITY)
Admission: EM | Admit: 2012-08-01 | Discharge: 2012-08-01 | Discharge status: Against Medical Advice | Payer: Self-pay | Attending: Emergency Medicine | Admitting: Emergency Medicine

## 2012-08-01 DIAGNOSIS — M79609 Pain in unspecified limb: Secondary | ICD-10-CM | POA: Insufficient documentation

## 2012-08-01 HISTORY — DX: Major depressive disorder, single episode, unspecified: F32.9

## 2012-08-01 HISTORY — DX: Bipolar disorder, unspecified: F31.9

## 2012-08-01 HISTORY — DX: Depression, unspecified: F32.A

## 2012-08-01 NOTE — ED Notes (Signed)
Pt presents to Ed via EMS with c/o bilateral leg pain  For 4 days. Pt states she ran out of aleve.

## 2012-08-01 NOTE — ED Notes (Signed)
Pt left after being seen in triage. 

## 2014-02-01 ENCOUNTER — Emergency Department (HOSPITAL_COMMUNITY)
Admission: EM | Admit: 2014-02-01 | Discharge: 2014-02-01 | Disposition: A | Discharge status: Home / Self Care | Payer: Self-pay | Attending: Emergency Medicine | Admitting: Emergency Medicine

## 2014-02-01 ENCOUNTER — Encounter (HOSPITAL_COMMUNITY): Payer: Self-pay | Admitting: Emergency Medicine

## 2014-02-01 DIAGNOSIS — Z791 Long term (current) use of non-steroidal anti-inflammatories (NSAID): Secondary | ICD-10-CM | POA: Insufficient documentation

## 2014-02-01 DIAGNOSIS — F329 Major depressive disorder, single episode, unspecified: Secondary | ICD-10-CM | POA: Insufficient documentation

## 2014-02-01 DIAGNOSIS — F17200 Nicotine dependence, unspecified, uncomplicated: Secondary | ICD-10-CM | POA: Insufficient documentation

## 2014-02-01 DIAGNOSIS — F1020 Alcohol dependence, uncomplicated: Secondary | ICD-10-CM | POA: Insufficient documentation

## 2014-02-01 DIAGNOSIS — F319 Bipolar disorder, unspecified: Secondary | ICD-10-CM | POA: Insufficient documentation

## 2014-02-01 DIAGNOSIS — I10 Essential (primary) hypertension: Secondary | ICD-10-CM | POA: Insufficient documentation

## 2014-02-01 DIAGNOSIS — Z79899 Other long term (current) drug therapy: Secondary | ICD-10-CM | POA: Insufficient documentation

## 2014-02-01 DIAGNOSIS — F32A Depression, unspecified: Secondary | ICD-10-CM

## 2014-02-01 DIAGNOSIS — Z880 Allergy status to penicillin: Secondary | ICD-10-CM | POA: Insufficient documentation

## 2014-02-01 DIAGNOSIS — K7010 Alcoholic hepatitis without ascites: Secondary | ICD-10-CM | POA: Insufficient documentation

## 2014-02-01 DIAGNOSIS — F3289 Other specified depressive episodes: Secondary | ICD-10-CM | POA: Insufficient documentation

## 2014-02-01 LAB — RAPID URINE DRUG SCREEN, HOSP PERFORMED
AMPHETAMINES: NOT DETECTED
BARBITURATES: NOT DETECTED
Benzodiazepines: NOT DETECTED
Cocaine: NOT DETECTED
Opiates: NOT DETECTED
Tetrahydrocannabinol: NOT DETECTED

## 2014-02-01 LAB — COMPREHENSIVE METABOLIC PANEL
ALBUMIN: 3.6 g/dL (ref 3.5–5.2)
ALK PHOS: 161 U/L — AB (ref 39–117)
ALT: 140 U/L — AB (ref 0–35)
AST: 456 U/L — AB (ref 0–37)
BILIRUBIN TOTAL: 0.7 mg/dL (ref 0.3–1.2)
BUN: 3 mg/dL — ABNORMAL LOW (ref 6–23)
CALCIUM: 8.3 mg/dL — AB (ref 8.4–10.5)
CO2: 26 mEq/L (ref 19–32)
Chloride: 94 mEq/L — ABNORMAL LOW (ref 96–112)
Creatinine, Ser: 0.46 mg/dL — ABNORMAL LOW (ref 0.50–1.10)
GFR calc Af Amer: >90 mL/min (ref >90)
Glucose, Bld: 77 mg/dL (ref 70–99)
POTASSIUM: 3.4 meq/L — AB (ref 3.7–5.3)
SODIUM: 139 meq/L (ref 137–147)
TOTAL PROTEIN: 7.7 g/dL (ref 6.0–8.3)

## 2014-02-01 LAB — CBC WITH DIFFERENTIAL/PLATELET
BASOS PCT: 1 % (ref 0–1)
Basophils Absolute: 0 10*3/uL (ref 0.0–0.1)
EOS ABS: 0.1 10*3/uL (ref 0.0–0.7)
Eosinophils Relative: 2 % (ref 0–5)
HCT: 40.8 % (ref 36.0–46.0)
HEMOGLOBIN: 13.8 g/dL (ref 12.0–15.0)
LYMPHS PCT: 43 % (ref 12–46)
Lymphs Abs: 1.5 10*3/uL (ref 0.7–4.0)
MCH: 27.1 pg (ref 26.0–34.0)
MCHC: 33.8 g/dL (ref 30.0–36.0)
MCV: 80.2 fL (ref 78.0–100.0)
MONO ABS: 0.4 10*3/uL (ref 0.1–1.0)
Monocytes Relative: 11 % (ref 3–12)
NEUTROS PCT: 43 % (ref 43–77)
Neutro Abs: 1.6 10*3/uL — ABNORMAL LOW (ref 1.7–7.7)
PLATELETS: 58 10*3/uL — AB (ref 150–400)
RBC: 5.09 MIL/uL (ref 3.87–5.11)
RDW: 21.4 % — ABNORMAL HIGH (ref 11.5–15.5)
WBC: 3.6 10*3/uL — ABNORMAL LOW (ref 4.0–10.5)

## 2014-02-01 LAB — ETHANOL: ALCOHOL ETHYL (B): 446 mg/dL — AB (ref 0–11)

## 2014-02-01 LAB — URINALYSIS, ROUTINE W REFLEX MICROSCOPIC
Bilirubin Urine: NEGATIVE
Glucose, UA: NEGATIVE mg/dL
Hgb urine dipstick: NEGATIVE
KETONES UR: NEGATIVE mg/dL
LEUKOCYTES UA: NEGATIVE
NITRITE: NEGATIVE
PROTEIN: NEGATIVE mg/dL
Specific Gravity, Urine: 1.009 (ref 1.005–1.030)
Urobilinogen, UA: 1 mg/dL (ref 0.0–1.0)
pH: 5.5 (ref 5.0–8.0)

## 2014-02-01 LAB — SALICYLATE LEVEL

## 2014-02-01 LAB — ACETAMINOPHEN LEVEL

## 2014-02-01 MED ORDER — VITAMIN B-1 100 MG PO TABS: 100.0000 mg | ORAL_TABLET | Freq: Every day | ORAL | Status: DC

## 2014-02-01 MED ORDER — LORAZEPAM 1 MG PO TABS: 0.0000 mg | ORAL_TABLET | Freq: Four times a day (QID) | ORAL | Status: DC

## 2014-02-01 MED ORDER — ALUM & MAG HYDROXIDE-SIMETH 200-200-20 MG/5ML PO SUSP: 30.0000 mL | ORAL | Status: DC | PRN

## 2014-02-01 MED ORDER — ZOLPIDEM TARTRATE 5 MG PO TABS: 10.0000 mg | ORAL_TABLET | Freq: Every evening | ORAL | Status: DC | PRN

## 2014-02-01 MED ORDER — IBUPROFEN 200 MG PO TABS: 600.0000 mg | ORAL_TABLET | Freq: Three times a day (TID) | ORAL | Status: DC | PRN

## 2014-02-01 MED ORDER — LORAZEPAM 1 MG PO TABS: 0.0000 mg | ORAL_TABLET | Freq: Two times a day (BID) | ORAL | Status: DC

## 2014-02-01 MED ORDER — NICOTINE 21 MG/24HR TD PT24: 21.0000 mg | MEDICATED_PATCH | Freq: Every day | TRANSDERMAL | Status: DC

## 2014-02-01 MED ORDER — ONDANSETRON HCL 4 MG PO TABS: 4.0000 mg | ORAL_TABLET | Freq: Three times a day (TID) | ORAL | Status: DC | PRN

## 2014-02-01 MED ORDER — THIAMINE HCL 100 MG/ML IJ SOLN: 100.0000 mg | Freq: Every day | INTRAMUSCULAR | Status: DC

## 2014-02-01 MED ORDER — ACETAMINOPHEN 325 MG PO TABS: 650.0000 mg | ORAL_TABLET | ORAL | Status: DC | PRN

## 2014-02-01 NOTE — ED Notes (Signed)
During assessments, patient repeatedly asking to go home.

## 2014-02-01 NOTE — ED Notes (Signed)
States wants detox  Very depressed last drink this am  Denies SI or HI  Pt very sleepy smells of ETOH hard to arrouse

## 2014-02-01 NOTE — ED Notes (Signed)
Pt was able to contact her uncle, uncle agreed to come pick pt up.

## 2014-02-01 NOTE — ED Notes (Signed)
Pt wanting to leave. Friend has been called to see if she will pick up pt and accept responsibility for her.

## 2014-02-01 NOTE — ED Notes (Signed)
Pt on telephone attempting to get a ride home.

## 2014-02-01 NOTE — ED Notes (Signed)
Pt using phone attempting to find ride home.

## 2014-02-01 NOTE — ED Provider Notes (Signed)
CSN: [id number]     Arrival date & time 02/01/14  1141 History   First MD Initiated Contact with Patient 02/01/14 1208     Chief Complaint  Patient presents with  . Depression     (Consider location/radiation/quality/duration/timing/severity/associated sxs/prior Treatment) HPI Patient reports she has a history of depression. She states her father died Nov 05, 2023 and she has been more depressed. She states she has been seen at [facility] and she was prescribed trazodone and Prozac however she states "they are messing me up". When I asked her what that means she states "they're too strong". She denies suicidal or homicidal ideation. She also states she only drinks 80 ounces of beer a day. She last drank 2 beers (80 ounces) just prior to coming to the ED today. She states she's been to detox 5 times in the past, however last time was 10 years ago. The detox was for alcohol. The longest she was sober was the 8 months she spent in prison.  PCP none [facility]  Past Medical History  Diagnosis Date  . Hypertension   . Alcohol abuse   . Cirrhosis of liver   . Bipolar 1 disorder   . Depression    Past Surgical History  Procedure Laterality Date  . Mandible fracture surgery     No family history on file. History  Substance Use Topics  . Smoking status: Current Every Day Smoker -- 1.00 packs/day for 10 years    Types: Cigarettes  . Smokeless tobacco: Not on file  . Alcohol Use: Yes     Comment: pt smells of ETOH at this time   Lives with a boyfriend  OB History   Grav Para Term Preterm Abortions TAB SAB Ect Mult Living                 Review of Systems  All other systems reviewed and are negative.      Allergies  Penicillins  Home Medications   Current Outpatient Rx  Name  Route  Sig  Dispense  Refill  . FLUoxetine (PROZAC) 20 MG capsule   Oral   Take 20 mg by mouth daily.         . naproxen sodium (ANAPROX) 220 MG tablet   Oral   Take 220 mg by mouth 2  (two) times daily with a meal.         . traZODone (DESYREL) 100 MG tablet   Oral   Take 100 mg by mouth at bedtime as needed for sleep.          BP 131/79  Pulse 94  Resp 16  SpO2 95%  Vital signs normal   Physical Exam  Nursing note and vitals reviewed. Constitutional: She is oriented to person, place, and time. She appears well-developed and well-nourished.  Non-toxic appearance. She does not appear ill. No distress.  HENT:  Head: Normocephalic and atraumatic.  Right Ear: External ear normal.  Left Ear: External ear normal.  Nose: Nose normal. No mucosal edema or rhinorrhea.  Mouth/Throat: Oropharynx is clear and moist and mucous membranes are normal. No dental abscesses or uvula swelling.  Eyes: Conjunctivae and EOM are normal. Pupils are equal, round, and reactive to light.  Neck: Normal range of motion and full passive range of motion without pain. Neck supple.  Cardiovascular: Normal rate, regular rhythm and normal heart sounds.  Exam reveals no gallop and no friction rub.   No murmur heard. Pulmonary/Chest: Effort normal and breath sounds normal.  No respiratory distress. She has no wheezes. She has no rhonchi. She has no rales. She exhibits no tenderness and no crepitus.  Abdominal: Soft. Normal appearance and bowel sounds are normal. She exhibits no distension. There is no tenderness. There is no rebound and no guarding.  Musculoskeletal: Normal range of motion. She exhibits no edema and no tenderness.  Moves all extremities well.   Neurological: She is alert and oriented to person, place, and time. She has normal strength. No cranial nerve deficit.  Skin: Skin is warm, dry and intact. No rash noted. No erythema. No pallor.  Psychiatric: Her affect is blunt. Her speech is slurred. She is slowed. She expresses no suicidal plans and no homicidal plans.  Tearful at times, slurred speech, appears intoxicated.    ED Course  Procedures (including critical care  time)  Medications  acetaminophen (TYLENOL) tablet 650 mg (not administered)  ibuprofen (ADVIL,MOTRIN) tablet 600 mg (not administered)  zolpidem (AMBIEN) tablet 10 mg (not administered)  nicotine (NICODERM CQ - dosed in mg/24 hours) patch 21 mg (0 mg Transdermal Hold 02/01/14 1434)  ondansetron (ZOFRAN) tablet 4 mg (not administered)  alum & mag hydroxide-simeth (MAALOX/MYLANTA) 200-200-20 MG/5ML suspension 30 mL (not administered)  LORazepam (ATIVAN) tablet 0-4 mg (not administered)    Followed by  LORazepam (ATIVAN) tablet 0-4 mg (not administered)  thiamine (VITAMIN B-1) tablet 100 mg (not administered)   Patient's friend went to get her a meal. Patient is able to eat and drink normally. 15:00 The psych nurse was wanting her to get IV fluids. However at this time patient is not having any nausea or vomiting. She can take oral fluids. IV felt to be unnecessary.  15:10 patient now expressing that she wants to leave. Patient is too intoxicated to be discharged. She states she will take a taxi cab home, however she does not have money with her. She states that she has money  at home. Her aunt was called however her aunt refuses to come get her stating she needs to stay here get help. The friend that was with her earlier has left. Patient advised to call her friend back to see if she will pick her back up. She will need someone to take her home since she is still very intoxicated. If she gets a ride she can be discharged as she has no SI or HI. Pt already goes to [facility] and can go back there to be seen.   19:40 patient's uncle is coming to take her home. Nurses are giving her referrals for alcohol treatment.  Labs Review Results for orders placed during the hospital encounter of 02/01/14  CBC WITH DIFFERENTIAL      Result Value Ref Range   WBC 3.6 (*) 4.0 - 10.5 K/uL   RBC 5.09  3.87 - 5.11 MIL/uL   Hemoglobin 13.8  12.0 - 15.0 g/dL   HCT 40.8  36.0 - 46.0 %   MCV 80.2  78.0 - 100.0 fL    MCH 27.1  26.0 - 34.0 pg   MCHC 33.8  30.0 - 36.0 g/dL   RDW 21.4 (*) 11.5 - 15.5 %   Platelets 58 (*) 150 - 400 K/uL   Neutrophils Relative % 43  43 - 77 %   Lymphocytes Relative 43  12 - 46 %   Monocytes Relative 11  3 - 12 %   Eosinophils Relative 2  0 - 5 %   Basophils Relative 1  0 - 1 %  Neutro Abs 1.6 (*) 1.7 - 7.7 K/uL   Lymphs Abs 1.5  0.7 - 4.0 K/uL   Monocytes Absolute 0.4  0.1 - 1.0 K/uL   Eosinophils Absolute 0.1  0.0 - 0.7 K/uL   Basophils Absolute 0.0  0.0 - 0.1 K/uL   RBC Morphology TARGET CELLS    COMPREHENSIVE METABOLIC PANEL      Result Value Ref Range   Sodium 139  137 - 147 mEq/L   Potassium 3.4 (*) 3.7 - 5.3 mEq/L   Chloride 94 (*) 96 - 112 mEq/L   CO2 26  19 - 32 mEq/L   Glucose, Bld 77  70 - 99 mg/dL   BUN 3 (*) 6 - 23 mg/dL   Creatinine, Ser 0.46 (*) 0.50 - 1.10 mg/dL   Calcium 8.3 (*) 8.4 - 10.5 mg/dL   Total Protein 7.7  6.0 - 8.3 g/dL   Albumin 3.6  3.5 - 5.2 g/dL   AST 456 (*) 0 - 37 U/L   ALT 140 (*) 0 - 35 U/L   Alkaline Phosphatase 161 (*) 39 - 117 U/L   Total Bilirubin 0.7  0.3 - 1.2 mg/dL   GFR calc non Af Amer >90  >90 mL/min   GFR calc Af Amer >90  >90 mL/min  ETHANOL      Result Value Ref Range   Alcohol, Ethyl (B) 446 (*) 0 - 11 mg/dL  URINALYSIS, ROUTINE W REFLEX MICROSCOPIC      Result Value Ref Range   Color, Urine YELLOW  YELLOW   APPearance CLEAR  CLEAR   Specific Gravity, Urine 1.009  1.005 - 1.030   pH 5.5  5.0 - 8.0   Glucose, UA NEGATIVE  NEGATIVE mg/dL   Hgb urine dipstick NEGATIVE  NEGATIVE   Bilirubin Urine NEGATIVE  NEGATIVE   Ketones, ur NEGATIVE  NEGATIVE mg/dL   Protein, ur NEGATIVE  NEGATIVE mg/dL   Urobilinogen, UA 1.0  0.0 - 1.0 mg/dL   Nitrite NEGATIVE  NEGATIVE   Leukocytes, UA NEGATIVE  NEGATIVE  URINE RAPID DRUG SCREEN (HOSP PERFORMED)      Result Value Ref Range   Opiates NONE DETECTED  NONE DETECTED   Cocaine NONE DETECTED  NONE DETECTED   Benzodiazepines NONE DETECTED  NONE DETECTED   Amphetamines  NONE DETECTED  NONE DETECTED   Tetrahydrocannabinol NONE DETECTED  NONE DETECTED   Barbiturates NONE DETECTED  NONE DETECTED  ACETAMINOPHEN LEVEL      Result Value Ref Range   Acetaminophen (Tylenol), Serum <15.0  10 - 30 ug/mL  SALICYLATE LEVEL      Result Value Ref Range   Salicylate Lvl <1.6 (*) 2.8 - 20.0 mg/dL   Laboratory interpretation all normal except in consultation, elevation of LFTs consistent with alcoholic hepatitis    Imaging Review No results found.   EKG Interpretation None      MDM   Final diagnoses:  Alcoholism  Alcoholic hepatitis  Depression    Disposition pending    Janice Norrie, MD 02/01/14 1096

## 2014-02-01 NOTE — ED Notes (Addendum)
Pt states she wants to get help for depression.  States she does not want help with ETOH use.  Pt states she drinks daily, 12pk of beer a day.  Behavior has been going on for years.  States she is taking prozac and trazadone.  Denies use of other drugs.

## 2014-02-01 NOTE — Discharge Instructions (Signed)
YOU NEED TO STOP DRINKING!!! Go to [facility] to discuss your depression and your medications. Return to the ED if you think you might hurt yourself or someone else.    Emergency Department Resource Guide    Behavioral Health Resources in the Community: Intensive Outpatient Programs Organization         Address  Phone  Notes  [place] Hutchinson. [address] [place], [place] [phone]   [facility] Outpatient [address] [place], [place]   ADS: [facility] [address] [place], [place]   [place] [address],  [place], [place] or [phone]   Substance Abuse Resources Organization         Address  Phone  Notes  [facility]  [phone]   [place]  [phone]   [facility] [place]   Chinita Pester  [phone]   Residential & Outpatient Substance Abuse Program  [phone]   Psychological Services Organization         Address  Phone  Notes  [facility] [place]  [place]  [phone]   [place] [address] [place] or [phone]    Mobile Crisis Teams Organization         Address  Phone  Notes  [facility], Mobile Crisis Care Unit  [phone]   [facility]  [address] [place], [place]   [place] [address], [place] [phone]    Self-Help/Support Groups Organization         Address  Phone             Notes  [place]. of [place] - variety of support groups  [place] Call for more information  Narcotics Anonymous (NA), [facility] [address], [employer] [place]  2 meetings at this location   Special educational needs teacher         Address  Phone  Notes  [facility] [place],    [place]   [phone]   [facility]  [address], [place] [address], [place], [place]   [place], [place] [phone] Admissions: 8am-3pm M-F  [facility] Substance [place] [address],    [place], [place] [phone]   [facility] [address] [place], [place], [place]   [facility] [address]  [place], [place]   [facility] - Intensive Outpatient [address], [place], [place]   [facility] ([place] [place]  [place], [place] [phone] or [phone]   [facility] ([facility]) [address] [place], [place] Accepts Medicaid  [facility] [place] [address],  [place] [phone] Substance Abuse/Addiction Treatment   [facility] Organization         Address  Phone  Notes  [facility]  [phone]   Domenic Schwab, PhD [address] [place], [place]   [phone] or [phone]   [facility], [place] [phone]   [place] [address], [place], [place] [phone] Insurance/Medicaid/sponsorship through [employer] and Families [address]  [place], [place] [phone] [place], [place] [phone]    Dr. Adele Schilder  [phone]   [facility] Dept. 1) [address] [place] 2) [place] 3)  [place] [address], [place] [phone]  [phone]   [place] [phone] or [phone] (After Hours)

## 2014-02-01 NOTE — ED Notes (Signed)
Pt ambulated to restroom with assistance. Pt upset and sts she is anxious.

## 2014-02-01 NOTE — ED Notes (Signed)
Walked PT out she said she feels safe in her home, when we got to the lobby she called Edd Arbour who said he was almost here. Pt was stable on her feet, and able to hold a conversation on way out. Stated she was not going to drink as much so her friend could not take control of her anymore.

## 2014-02-01 NOTE — ED Notes (Signed)
Pt contact Sherry Baker [phone] Pt friends sts she is taking her purse.

## 2014-02-01 NOTE — ED Notes (Signed)
This RN got into contact with friend Nena Jordan.  Informed friend of pt's disposition and pt wanting to leave.  Friend stated she would come visit friend.

## 2014-02-01 NOTE — ED Notes (Addendum)
Discharge instructions reviewed with pt. Pt verbalized understanding. Pt coherent and able to answer questions with no problem. Pt ambulated with no difficulty.

## 2014-02-11 ENCOUNTER — Inpatient Hospital Stay (HOSPITAL_COMMUNITY)
Admission: EM | Admit: 2014-02-11 | Discharge: 2014-02-14 | DRG: 896 | Disposition: A | Discharge status: Home Health | Payer: Self-pay | Attending: Internal Medicine | Admitting: Internal Medicine

## 2014-02-11 ENCOUNTER — Encounter (HOSPITAL_COMMUNITY): Payer: Self-pay | Admitting: Emergency Medicine

## 2014-02-11 DIAGNOSIS — R748 Abnormal levels of other serum enzymes: Secondary | ICD-10-CM | POA: Diagnosis present

## 2014-02-11 DIAGNOSIS — F17200 Nicotine dependence, unspecified, uncomplicated: Secondary | ICD-10-CM | POA: Diagnosis present

## 2014-02-11 DIAGNOSIS — R45851 Suicidal ideations: Secondary | ICD-10-CM

## 2014-02-11 DIAGNOSIS — IMO0002 Reserved for concepts with insufficient information to code with codable children: Secondary | ICD-10-CM

## 2014-02-11 DIAGNOSIS — D61818 Other pancytopenia: Secondary | ICD-10-CM | POA: Diagnosis present

## 2014-02-11 DIAGNOSIS — F10921 Alcohol use, unspecified with intoxication delirium: Secondary | ICD-10-CM | POA: Diagnosis present

## 2014-02-11 DIAGNOSIS — I10 Essential (primary) hypertension: Secondary | ICD-10-CM | POA: Diagnosis present

## 2014-02-11 DIAGNOSIS — F332 Major depressive disorder, recurrent severe without psychotic features: Secondary | ICD-10-CM

## 2014-02-11 DIAGNOSIS — R7401 Elevation of levels of liver transaminase levels: Secondary | ICD-10-CM | POA: Diagnosis present

## 2014-02-11 DIAGNOSIS — F1994 Other psychoactive substance use, unspecified with psychoactive substance-induced mood disorder: Secondary | ICD-10-CM | POA: Diagnosis present

## 2014-02-11 DIAGNOSIS — E876 Hypokalemia: Secondary | ICD-10-CM | POA: Diagnosis present

## 2014-02-11 DIAGNOSIS — F1910 Other psychoactive substance abuse, uncomplicated: Secondary | ICD-10-CM | POA: Diagnosis present

## 2014-02-11 DIAGNOSIS — K7689 Other specified diseases of liver: Secondary | ICD-10-CM | POA: Diagnosis present

## 2014-02-11 DIAGNOSIS — F329 Major depressive disorder, single episode, unspecified: Secondary | ICD-10-CM | POA: Diagnosis present

## 2014-02-11 DIAGNOSIS — I498 Other specified cardiac arrhythmias: Secondary | ICD-10-CM | POA: Diagnosis present

## 2014-02-11 DIAGNOSIS — F411 Generalized anxiety disorder: Secondary | ICD-10-CM | POA: Diagnosis present

## 2014-02-11 DIAGNOSIS — Z5900 Homelessness unspecified: Secondary | ICD-10-CM

## 2014-02-11 DIAGNOSIS — F319 Bipolar disorder, unspecified: Secondary | ICD-10-CM | POA: Diagnosis present

## 2014-02-11 DIAGNOSIS — F1210 Cannabis abuse, uncomplicated: Secondary | ICD-10-CM | POA: Diagnosis present

## 2014-02-11 DIAGNOSIS — N390 Urinary tract infection, site not specified: Secondary | ICD-10-CM | POA: Diagnosis present

## 2014-02-11 DIAGNOSIS — E43 Unspecified severe protein-calorie malnutrition: Secondary | ICD-10-CM | POA: Diagnosis present

## 2014-02-11 DIAGNOSIS — R740 Nonspecific elevation of levels of transaminase and lactic acid dehydrogenase [LDH]: Secondary | ICD-10-CM

## 2014-02-11 DIAGNOSIS — F10929 Alcohol use, unspecified with intoxication, unspecified: Secondary | ICD-10-CM

## 2014-02-11 DIAGNOSIS — K7460 Unspecified cirrhosis of liver: Secondary | ICD-10-CM | POA: Diagnosis present

## 2014-02-11 DIAGNOSIS — F1020 Alcohol dependence, uncomplicated: Principal | ICD-10-CM | POA: Diagnosis present

## 2014-02-11 DIAGNOSIS — D696 Thrombocytopenia, unspecified: Secondary | ICD-10-CM | POA: Diagnosis present

## 2014-02-11 DIAGNOSIS — F32A Depression, unspecified: Secondary | ICD-10-CM | POA: Diagnosis present

## 2014-02-11 HISTORY — DX: Suicidal ideations: R45.851

## 2014-02-11 LAB — URINALYSIS, ROUTINE W REFLEX MICROSCOPIC
Glucose, UA: NEGATIVE mg/dL
HGB URINE DIPSTICK: NEGATIVE
KETONES UR: 15 mg/dL — AB
Nitrite: POSITIVE — AB
Protein, ur: NEGATIVE mg/dL
Specific Gravity, Urine: 1.017 (ref 1.005–1.030)
UROBILINOGEN UA: 4 mg/dL — AB (ref 0.0–1.0)
pH: 6 (ref 5.0–8.0)

## 2014-02-11 LAB — ACETAMINOPHEN LEVEL: Acetaminophen (Tylenol), Serum: <15 ug/mL (ref 10–30)

## 2014-02-11 LAB — RAPID URINE DRUG SCREEN, HOSP PERFORMED
Amphetamines: NOT DETECTED
Barbiturates: NOT DETECTED
Benzodiazepines: NOT DETECTED
COCAINE: POSITIVE — AB
OPIATES: NOT DETECTED
TETRAHYDROCANNABINOL: NOT DETECTED

## 2014-02-11 LAB — SALICYLATE LEVEL

## 2014-02-11 LAB — CBC
HCT: 37.5 % (ref 36.0–46.0)
Hemoglobin: 13.2 g/dL (ref 12.0–15.0)
MCH: 28.6 pg (ref 26.0–34.0)
MCHC: 35.2 g/dL (ref 30.0–36.0)
MCV: 81.3 fL (ref 78.0–100.0)
Platelets: 38 10*3/uL — ABNORMAL LOW (ref 150–400)
RBC: 4.61 MIL/uL (ref 3.87–5.11)
RDW: 21.5 % — ABNORMAL HIGH (ref 11.5–15.5)
WBC: 4.5 10*3/uL (ref 4.0–10.5)

## 2014-02-11 LAB — COMPREHENSIVE METABOLIC PANEL
ALT: 167 U/L — AB (ref 0–35)
AST: 642 U/L — ABNORMAL HIGH (ref 0–37)
Albumin: 3.9 g/dL (ref 3.5–5.2)
Alkaline Phosphatase: 173 U/L — ABNORMAL HIGH (ref 39–117)
BUN: 5 mg/dL — ABNORMAL LOW (ref 6–23)
CALCIUM: 8.6 mg/dL (ref 8.4–10.5)
CO2: 22 meq/L (ref 19–32)
CREATININE: 0.49 mg/dL — AB (ref 0.50–1.10)
Chloride: 90 mEq/L — ABNORMAL LOW (ref 96–112)
GFR calc Af Amer: >90 mL/min (ref >90)
GLUCOSE: 75 mg/dL (ref 70–99)
Potassium: 3.6 mEq/L — ABNORMAL LOW (ref 3.7–5.3)
SODIUM: 137 meq/L (ref 137–147)
TOTAL PROTEIN: 7.9 g/dL (ref 6.0–8.3)
Total Bilirubin: 2.9 mg/dL — ABNORMAL HIGH (ref 0.3–1.2)

## 2014-02-11 LAB — MAGNESIUM: MAGNESIUM: 1.9 mg/dL (ref 1.5–2.5)

## 2014-02-11 LAB — ETHANOL: Alcohol, Ethyl (B): 344 mg/dL — ABNORMAL HIGH (ref 0–11)

## 2014-02-11 LAB — LACTIC ACID, PLASMA: Lactic Acid, Venous: 1.3 mmol/L (ref 0.5–2.2)

## 2014-02-11 LAB — PROTIME-INR
INR: 1.04 (ref 0.00–1.49)
Prothrombin Time: 13.4 seconds (ref 11.6–15.2)

## 2014-02-11 LAB — LIPASE, BLOOD: Lipase: 70 U/L — ABNORMAL HIGH (ref 11–59)

## 2014-02-11 LAB — MRSA PCR SCREENING: MRSA BY PCR: NEGATIVE

## 2014-02-11 LAB — PHOSPHORUS: Phosphorus: 3.7 mg/dL (ref 2.3–4.6)

## 2014-02-11 LAB — URINE MICROSCOPIC-ADD ON

## 2014-02-11 LAB — RAPID HIV SCREEN (WH-MAU): Rapid HIV Screen: NONREACTIVE

## 2014-02-11 MED ORDER — PROMETHAZINE HCL 25 MG PO TABS: 12.5000 mg | ORAL_TABLET | Freq: Four times a day (QID) | ORAL | Status: DC | PRN

## 2014-02-11 MED ORDER — LORAZEPAM 2 MG/ML IJ SOLN
2.0000 mg | INTRAMUSCULAR | Status: DC | PRN
  Administered 2014-02-12: 2 mg via INTRAVENOUS
  Filled 2014-02-11: qty 1

## 2014-02-11 MED ORDER — SODIUM CHLORIDE 0.9 % IJ SOLN
3.0000 mL | Freq: Two times a day (BID) | INTRAMUSCULAR | Status: DC
  Administered 2014-02-11: 3 mL via INTRAVENOUS

## 2014-02-11 MED ORDER — SODIUM CHLORIDE 0.9 % IV SOLN: 250.0000 mL | INTRAVENOUS | Status: DC | PRN

## 2014-02-11 MED ORDER — ADULT MULTIVITAMIN W/MINERALS CH
1.0000 | ORAL_TABLET | Freq: Every day | ORAL | Status: DC
Start: 2014-02-12 — End: 2014-02-14
  Administered 2014-02-12 – 2014-02-14 (×3): 1 via ORAL
  Filled 2014-02-11 (×3): qty 1

## 2014-02-11 MED ORDER — FOLIC ACID 5 MG/ML IJ SOLN
1.0000 mg | Freq: Every day | INTRAMUSCULAR | Status: DC
  Administered 2014-02-12 – 2014-02-14 (×3): 1 mg via INTRAVENOUS
  Filled 2014-02-11 (×3): qty 0.2

## 2014-02-11 MED ORDER — THIAMINE HCL 100 MG/ML IJ SOLN
100.0000 mg | Freq: Every day | INTRAMUSCULAR | Status: DC
  Administered 2014-02-12: 100 mg via INTRAVENOUS
  Administered 2014-02-13: 09:00:00 via INTRAVENOUS
  Administered 2014-02-14: 100 mg via INTRAVENOUS
  Filled 2014-02-11 (×3): qty 1

## 2014-02-11 MED ORDER — SODIUM CHLORIDE 0.9 % IJ SOLN
3.0000 mL | Freq: Two times a day (BID) | INTRAMUSCULAR | Status: DC
Start: 2014-02-11 — End: 2014-02-12
  Administered 2014-02-11 – 2014-02-12 (×2): 3 mL via INTRAVENOUS

## 2014-02-11 MED ORDER — POTASSIUM CHLORIDE IN NACL 20-0.9 MEQ/L-% IV SOLN
INTRAVENOUS | Status: DC
  Administered 2014-02-12: 11:00:00 via INTRAVENOUS
  Administered 2014-02-12: 1000 mL via INTRAVENOUS
  Filled 2014-02-11 (×5): qty 1000

## 2014-02-11 MED ORDER — SODIUM CHLORIDE 0.9 % IJ SOLN: 3.0000 mL | INTRAMUSCULAR | Status: DC | PRN

## 2014-02-11 MED ORDER — THIAMINE HCL 100 MG/ML IJ SOLN
Freq: Once | INTRAVENOUS | Status: AC
  Administered 2014-02-11: 20:00:00 via INTRAVENOUS
  Filled 2014-02-11: qty 1000

## 2014-02-11 MED ORDER — LORAZEPAM 1 MG PO TABS
2.0000 mg | ORAL_TABLET | Freq: Once | ORAL | Status: AC
  Administered 2014-02-11: 2 mg via ORAL
  Filled 2014-02-11: qty 2

## 2014-02-11 NOTE — ED Notes (Signed)
Charge Nurse and House coverage made aware of pt

## 2014-02-11 NOTE — ED Notes (Addendum)
Pt c/o depressed and having suicidal and homicidal thoughts. Pt admits to ETOH use yesterday, Pt reports that she is homeless. Pt tearful in triage. Pt has been dealing with depression since 10-19-2023 when her father died. Pt reports unable to eat for 2 weeks. Pt has been out of her prozac for four days.

## 2014-02-11 NOTE — ED Provider Notes (Signed)
CSN: [id number]     Arrival date & time 02/11/14  1349 History   First MD Initiated Contact with Patient 02/11/14 1401     Chief Complaint  Patient presents with  . Stress  . Depression  . Suicidal  . Homicidal     (Consider location/radiation/quality/duration/timing/severity/associated sxs/prior Treatment) HPI Comments: Patient is a 48 year old female with history of bipolar disorder and depression. She presents with complaints of depression and alcohol abuse for which she desires help. She says she drinks up to a case of beer per day. She tells me she is homeless and [place] the woods. Her father passed away recently and this has been extremely stressful and has been worsening her situation.  Patient is a 48 y.o. female presenting with mental health disorder. The history is provided by the patient.  Mental Health Problem Presenting symptoms: depression and suicidal thoughts   Degree of incapacity (severity):  Severe Onset quality:  Gradual Timing:  Constant Progression:  Worsening Chronicity:  Chronic Context: alcohol use, drug abuse and stressful life event     Past Medical History  Diagnosis Date  . Hypertension   . Alcohol abuse   . Cirrhosis of liver   . Bipolar 1 disorder   . Depression    Past Surgical History  Procedure Laterality Date  . Mandible fracture surgery     No family history on file. History  Substance Use Topics  . Smoking status: Current Every Day Smoker -- 1.00 packs/day for 10 years    Types: Cigarettes  . Smokeless tobacco: Not on file  . Alcohol Use: Yes     Comment: pt smells of ETOH at this time   OB History   Grav Para Term Preterm Abortions TAB SAB Ect Mult Living                 Review of Systems  Psychiatric/Behavioral: Positive for suicidal ideas.  All other systems reviewed and are negative.     Allergies  Penicillins  Home Medications   Current Outpatient Rx  Name  Route  Sig  Dispense  Refill  . albuterol  (PROVENTIL HFA;VENTOLIN HFA) 108 (90 BASE) MCG/ACT inhaler   Inhalation   Inhale 2 puffs into the lungs every 6 (six) hours as needed for wheezing or shortness of breath.         Marland Kitchen FLUoxetine (PROZAC) 20 MG capsule   Oral   Take 20 mg by mouth daily.         . traZODone (DESYREL) 100 MG tablet   Oral   Take 100 mg by mouth at bedtime as needed for sleep.          BP 125/85  Pulse 111  Temp(Src) 98.3 F (36.8 C) (Oral)  Resp 18  Ht 5\' 1"  (1.549 m)  SpO2 90% Physical Exam  Nursing note and vitals reviewed. Constitutional: She is oriented to person, place, and time. She appears well-developed and well-nourished. No distress.  The order of alcohol is present.  HENT:  Head: Normocephalic and atraumatic.  Mouth/Throat: Oropharynx is clear and moist.  Eyes: EOM are normal. Pupils are equal, round, and reactive to light.  Neck: Normal range of motion. Neck supple.  Cardiovascular: Normal rate, regular rhythm and normal heart sounds.   No murmur heard. Pulmonary/Chest: Breath sounds normal. No respiratory distress. She has no wheezes.  Abdominal: Soft. Bowel sounds are normal. She exhibits no distension. There is no tenderness.  Musculoskeletal: Normal range of motion. She exhibits no  edema.  Neurological: She is alert and oriented to person, place, and time.  Skin: Skin is warm and dry. She is not diaphoretic.    ED Course  Procedures (including critical care time) Labs Review Labs Reviewed  ACETAMINOPHEN LEVEL  CBC  COMPREHENSIVE METABOLIC PANEL  ETHANOL  SALICYLATE LEVEL  URINE RAPID DRUG SCREEN (HOSP PERFORMED)   Imaging Review No results found.   EKG Interpretation None      MDM   Final diagnoses:  None    Patient is a 48 year old female who presents with complaints of suicidal ideation and requesting help with alcoholism. Workup reveals an alcohol level of 344 with elevation of her liver functions. She also tests positive for cocaine. I am concerned  about her worsening liver function and believe that she is at risk for DTs. I've spoken with the internal medicine teaching service regarding admission. They will evaluate the patient and determine the final disposition.    Veryl Speak, MD 02/11/14 [phone]

## 2014-02-11 NOTE — H&P (Signed)
Date: 02/11/2014               Patient Name:  Sherry Baker MRN: [id number]  DOB: 26-May-1966 Age / Sex: 48 y.o., female   PCP: No Pcp Per Patient         Medical Service: Internal Medicine Teaching Service         Attending Physician: Dr. Veryl Speak, MD    First Contact: Dr. Ronnald Ramp Pager: [phone]  Second Contact: Dr. Eula Fried Pager: [phone]       After Hours (After 5p/  First Contact Pager: [phone]  weekends / holidays): Second Contact Pager: [phone]   Chief Complaint: Suicidal Ideations  History of Present Illness: Ms. Paloma Grange is a 48 y.o. female w/ PMHx of HTN, alcohol/substance abuse, Depression, and Bipolar I, presents to the ED w/ alcohol intoxication and suicidal ideations. When interviewed, patient was quite intoxicated, not able to answer some questions. The patient does claim that she has been very depressed lately, ever since her mother passed away, but she was not able to say when that was. It was also noted in previous ED notes that her father also passed away in 11/17/2013. The patient does claim that she sees someone at [facility] and says she has been taking Trazodone and Prozac for about a month now and does claim to have been compliant with these medications. When asked, patient patient was unable to answer questions about appetite, irritability, sleep disturbances, concentration, etc. However, she did deny suicidal ideations 3x during the interview.  The patient also admitted to drinking to excess every day, saying that she will always drink one 40 oz. Chucky May in the morning to "get some motivation" and will then proceed with he day. She says she usually drinks ~5 40 oz. Beers daily. She was unable to say how much she drank today however. She also admitted to smoking crack-cocaine 2 days ago, but denies chronic use. She also denies IVDA.  The patient also mentioned that she was homeless and lives in the woods with a friend, but was unable to elaborate further on her living  situation.  She otherwise denies any nausea, abdominal pain, chest pain, SOB, LE swelling, dysuria, or hematuria.    Meds: No current facility-administered medications for this encounter.   Current Outpatient Prescriptions  Medication Sig Dispense Refill  . albuterol (PROVENTIL HFA;VENTOLIN HFA) 108 (90 BASE) MCG/ACT inhaler Inhale 2 puffs into the lungs every 6 (six) hours as needed for wheezing or shortness of breath.      Marland Kitchen FLUoxetine (PROZAC) 20 MG capsule Take 20 mg by mouth daily.      . traZODone (DESYREL) 100 MG tablet Take 100 mg by mouth at bedtime as needed for sleep.      . [DISCONTINUED] ziprasidone (GEODON) 40 MG capsule Take 1 capsule (40 mg total) by mouth 2 (two) times daily with a meal.  20 capsule  0    Allergies: Allergies as of 02/11/2014 - Review Complete 02/11/2014  Allergen Reaction Noted  . Penicillins  11/05/2011   Past Medical History  Diagnosis Date  . Hypertension   . Alcohol abuse   . Cirrhosis of liver   . Bipolar 1 disorder   . Depression    Past Surgical History  Procedure Laterality Date  . Mandible fracture surgery     No family history on file. History   Social History  . Marital Status: Single    Spouse Name: N/A    Number of Children: N/A  .  Years of Education: N/A   Occupational History  . Not on file.   Social History Main Topics  . Smoking status: Current Every Day Smoker -- 1.00 packs/day for 10 years    Types: Cigarettes  . Smokeless tobacco: Not on file  . Alcohol Use: Yes     Comment: pt smells of ETOH at this time  . Drug Use: Yes    Special: Marijuana  . Sexual Activity: Yes   Other Topics Concern  . Not on file   Social History Narrative  . No narrative on file    Review of Systems: General: Denies fever, chills, diaphoresis, appetite change and fatigue.  Respiratory: Denies SOB, DOE, cough, chest tightness, and wheezing.   Cardiovascular: Denies chest pain and palpitations.  Gastrointestinal: Positive  for vomiting and diarrhea. Denies nausea, abdominal pain, constipation, blood in stool and abdominal distention.  Genitourinary: Denies dysuria, urgency, frequency, hematuria, and flank pain. Endocrine: Denies hot or cold intolerance, polyuria, and polydipsia. Musculoskeletal: Denies myalgias, back pain, joint swelling, arthralgias and gait problem.  Skin: Denies pallor, rash and wounds.  Neurological: Positive for weakness. Denies dizziness, seizures, syncope, lightheadedness, numbness and headaches.  Psychiatric/Behavioral: Positive for depression. Denies confusion, nervousness, sleep disturbance and agitation.  Physical Exam: Filed Vitals:   02/11/14 1356 02/11/14 1427 02/11/14 1457 02/11/14 1551  BP:  125/85 125/85 112/77  Pulse:  111 93 91  Temp:      TempSrc:      Resp:      Height: '5\' 1"'  (1.549 m)     SpO2:  90%  90%  General: Vital signs reviewed.  Patient is an intoxicated female, appears older than her stated age, in no acute distress and cooperative with exam. Lethargic, unable to answer all questions.  Head: Normocephalic and atraumatic.  Eyes: PERRL, EOMI, conjunctivae normal. No scleral icterus.  Neck: Supple, trachea midline, normal ROM, No JVD, masses, thyromegaly, or carotid bruit present.  Cardiovascular: Tachycardic, normal rhythm, S1 normal, S2 normal, no murmurs, gallops, or rubs. Pulmonary/Chest: Air entry equal bilaterally, no rales, or rhonchi. Mild end expiratory wheeze on anterior lung fields.  Abdominal: Soft, non-tender, non-distended, BS +, no masses, organomegaly, or guarding present.  Musculoskeletal: No joint deformities, erythema, or stiffness, ROM full and nontender. Extremities: No swelling or edema,  pulses symmetric and intact bilaterally. No cyanosis or clubbing. Neurological: A&O x1, strength is normal and symmetric bilaterally, cranial nerves generally intact, no focal motor deficit, sensory intact to light touch bilaterally.  Skin: Warm, dry and  intact. No rashes or erythema. Psychiatric: Depressed mood. Speech slurred and slow, patient visibly intoxicated. Cognition and memory are normal.   Lab results: Basic Metabolic Panel:  Recent Labs  02/11/14 1423  NA 137  K 3.6*  CL 90*  CO2 22  GLUCOSE 75  BUN 5*  CREATININE 0.49*  CALCIUM 8.6   Liver Function Tests:  Recent Labs  02/11/14 1423  AST 642*  ALT 167*  ALKPHOS 173*  BILITOT 2.9*  PROT 7.9  ALBUMIN 3.9    Recent Labs  02/11/14 1518  LIPASE 70*   CBC:  Recent Labs  02/11/14 1423  WBC 4.5  HGB 13.2  HCT 37.5  MCV 81.3  PLT 38*   Urine Drug Screen: Drugs of Abuse     Component Value Date/Time   LABOPIA NONE DETECTED 02/11/2014 1414   COCAINSCRNUR POSITIVE* 02/11/2014 1414   LABBENZ NONE DETECTED 02/11/2014 1414   AMPHETMU NONE DETECTED 02/11/2014 1414   THCU NONE DETECTED  02/11/2014 1414   LABBARB NONE DETECTED 02/11/2014 1414    Alcohol Level:  Recent Labs  02/11/14 1423  ETH 344*   Other results: EKG: pending  Assessment & Plan by Problem: Ms. Fusako Tanabe is a 48 y.o. female w/ PMHx of HTN, alcohol/substance abuse, Depression, and Bipolar I, admitted for alcohol intoxication and suicidal ideations.  Suicidal Ideations- On arrival to the ED, patient admitted to wanting to harm herself, however, when interviewed at bedside by the medical team, denied suicidal ideations 3x. Given her significant alcohol intoxication, still unclear if this is truly the case. Patient claims she has been seeing someone at [facility] and has been taking Prozac and Trazodone for ~ 1 month. She claims she has been very compliant with these medications saying that she is almost in need of a refill and is supposed to go back to [place] soon. The patient was unable to appropriately answer questions about sleep, appetite, concentration, energy level, etc. She did admit to being quite depressed lately and confirmed that she has lost both of her parents recently. According  to ED notes, the patient's father passed away in 11-29-13.  -Admit to stepdown -Psychiatry consulted; will see patient when sober -Suicide precautions -Hold Prozac + Trazodone for now; restart in AM  Alcohol & Substance Abuse- Patient admits to excessive alcohol intake, EtOh of 344 on admission. Also seen in the ED on 02/01/14, EtOh 446 at that time. When asked, she says she drinks about five 40 oz. Beers daily and has for quite some time. Patient also has UDS positive for cocaine and admits to smoking crack 2 days ago, but denies chronic use. Also denies IVDA. Tylenol + salicylates undetectable. Per ED notes, the patient has been to detox 5 times in the past, however, the last time was 10 years ago. The patient was unable to describe her willingness to stop using alcohol. -CIWA protocol -Banana bag x1 -Thiamine + Folic acid -Magnesium + phosphorus pending -HIV pending -EKG pending -Social work consult -Cardiac monitoring -Phenergan 12.5 mg q6h prn  Transaminitis- Patient w/ AST of 642, ALT 167, total bili 2.9, Alk Phos 173, increased from previous ED visit on 02/01/14. Likely 2/2 excessive alcohol intake. Must rule out other possibilities.  -Acute Hepatitis panel -HIV pending -Abdominal US  Thrombocytopenia- Admitted with platelets of 38, also shown to be 58 on 02/01/14. This appears to be a relatively recent issue, given that her platelets were as high as 199 on 11/23/11. No obvious signs of bleeding at this time.  -Continue to monitor.  -Hold VTE prophylaxis; SCD's for now   HTN- Normotensive on admission. Not on any home meds for HTN. -Continue to monitor.  DVT/PE PPx- SCD's  Dispo: Disposition is deferred at this time, awaiting improvement of current medical problems. Anticipated discharge in approximately 2-3 day(s).   The patient does not have a current PCP (No Pcp Per Patient) and does not need an [facility] hospital follow-up appointment after discharge.  The patient does not have  transportation limitations that hinder transportation to clinic appointments.  Signed: Corky Sox, MD 02/11/2014, 4:19 PM

## 2014-02-12 DIAGNOSIS — N390 Urinary tract infection, site not specified: Secondary | ICD-10-CM

## 2014-02-12 DIAGNOSIS — F1410 Cocaine abuse, uncomplicated: Secondary | ICD-10-CM

## 2014-02-12 DIAGNOSIS — I10 Essential (primary) hypertension: Secondary | ICD-10-CM

## 2014-02-12 DIAGNOSIS — R7401 Elevation of levels of liver transaminase levels: Secondary | ICD-10-CM

## 2014-02-12 DIAGNOSIS — D696 Thrombocytopenia, unspecified: Secondary | ICD-10-CM

## 2014-02-12 DIAGNOSIS — R740 Nonspecific elevation of levels of transaminase and lactic acid dehydrogenase [LDH]: Secondary | ICD-10-CM

## 2014-02-12 DIAGNOSIS — F1010 Alcohol abuse, uncomplicated: Secondary | ICD-10-CM

## 2014-02-12 DIAGNOSIS — R45851 Suicidal ideations: Secondary | ICD-10-CM

## 2014-02-12 LAB — COMPREHENSIVE METABOLIC PANEL
ALT: 125 U/L — ABNORMAL HIGH (ref 0–35)
AST: 454 U/L — ABNORMAL HIGH (ref 0–37)
Albumin: 3 g/dL — ABNORMAL LOW (ref 3.5–5.2)
Alkaline Phosphatase: 136 U/L — ABNORMAL HIGH (ref 39–117)
BUN: 5 mg/dL — ABNORMAL LOW (ref 6–23)
CALCIUM: 8.1 mg/dL — AB (ref 8.4–10.5)
CO2: 24 meq/L (ref 19–32)
Chloride: 100 mEq/L (ref 96–112)
Creatinine, Ser: 0.4 mg/dL — ABNORMAL LOW (ref 0.50–1.10)
GLUCOSE: 80 mg/dL (ref 70–99)
Potassium: 3.3 mEq/L — ABNORMAL LOW (ref 3.7–5.3)
Sodium: 143 mEq/L (ref 137–147)
Total Bilirubin: 2.7 mg/dL — ABNORMAL HIGH (ref 0.3–1.2)
Total Protein: 6.6 g/dL (ref 6.0–8.3)

## 2014-02-12 LAB — CBC
HEMATOCRIT: 32.4 % — AB (ref 36.0–46.0)
HEMOGLOBIN: 11 g/dL — AB (ref 12.0–15.0)
MCH: 27.8 pg (ref 26.0–34.0)
MCHC: 34 g/dL (ref 30.0–36.0)
MCV: 82 fL (ref 78.0–100.0)
Platelets: 24 10*3/uL — CL (ref 150–400)
RBC: 3.95 MIL/uL (ref 3.87–5.11)
RDW: 21.6 % — AB (ref 11.5–15.5)
WBC: 2.7 10*3/uL — AB (ref 4.0–10.5)

## 2014-02-12 LAB — HEPATITIS PANEL, ACUTE
HCV AB: NEGATIVE
HEP B C IGM: NONREACTIVE
HEP B S AG: NEGATIVE
Hep A IgM: NONREACTIVE

## 2014-02-12 MED ORDER — SODIUM CHLORIDE 0.9 % IV SOLN
INTRAVENOUS | Status: DC
  Administered 2014-02-12: 1000 mL via INTRAVENOUS
  Administered 2014-02-12: 15:00:00 via INTRAVENOUS

## 2014-02-12 MED ORDER — POTASSIUM CHLORIDE CRYS ER 20 MEQ PO TBCR
40.0000 meq | EXTENDED_RELEASE_TABLET | Freq: Once | ORAL | Status: AC
  Administered 2014-02-12: 40 meq via ORAL
  Filled 2014-02-12: qty 2

## 2014-02-12 MED ORDER — LORAZEPAM 2 MG/ML IJ SOLN
2.0000 mg | INTRAMUSCULAR | Status: DC | PRN
  Administered 2014-02-13: 2 mg via INTRAVENOUS
  Filled 2014-02-12: qty 1

## 2014-02-12 MED ORDER — CIPROFLOXACIN HCL 250 MG PO TABS
250.0000 mg | ORAL_TABLET | Freq: Two times a day (BID) | ORAL | Status: DC
  Administered 2014-02-12 – 2014-02-14 (×5): 250 mg via ORAL
  Filled 2014-02-12 (×6): qty 1

## 2014-02-12 NOTE — H&P (Signed)
Internal Medicine On-Call Attending Admission Note Date: 02/12/2014  Patient name: Sherry Baker Medical record number: [id number] Date of birth: 1966/06/08 Age: 48 y.o. Gender: female  I saw and evaluated the patient. I reviewed the resident's note and I agree with the resident's findings and plan as documented in the resident's note, with the following additional comments.  Chief Complaint(s): Depression with suicidal ideation, alcohol abuse  History - key components related to admission: Patient is a 48 year old woman with history of alcohol and substance abuse, depression, bipolar disorder, hypertension, and other problems as outlined in the medical history, who presented to the emergency department complaining of depression and suicidal ideation.  Patient reports a history of alcohol abuse for which she desires help.  Patient was intoxicated on presentation, but today is alert and oriented; she reports having had suicidal ideation as recently as yesterday, but denies any specific plan to harm herself.  Physical Exam - key components related to admission:  Filed Vitals:   02/12/14 0404 02/12/14 0557 02/12/14 0714 02/12/14 0800  BP: 124/65 122/85 115/78 147/86  Pulse: 110 114 104 99  Temp: 99.6 F (37.6 C)   97.5 F (36.4 C)  TempSrc: Oral   Oral  Resp: 21 17 25 21   Height:      Weight:      SpO2: 96% 96% 99% 99%    General:  Alert, oriented to person, place, month, year, and day of week Lungs: Clear Heart: Regular; tachycardic; no extra sounds or murmurs Abdomen: Bowel sounds present, soft, nontender Extremities: No edema  Lab results:   Basic Metabolic Panel:  Recent Labs  02/11/14 1423 02/11/14 2148 02/12/14 0405  NA 137  --  143  K 3.6*  --  3.3*  CL 90*  --  100  CO2 22  --  24  GLUCOSE 75  --  80  BUN 5*  --  5*  CREATININE 0.49*  --  0.40*  CALCIUM 8.6  --  8.1*  MG  --  1.9  --   PHOS  --  3.7  --     Liver Function Tests:  Recent Labs   02/11/14 1423 02/12/14 0405  AST 642* 454*  ALT 167* 125*  ALKPHOS 173* 136*  BILITOT 2.9* 2.7*  PROT 7.9 6.6  ALBUMIN 3.9 3.0*    Recent Labs  02/11/14 1518  LIPASE 70*     CBC:  Recent Labs  02/11/14 1423 02/12/14 0405  WBC 4.5 2.7*  HGB 13.2 11.0*  HCT 37.5 32.4*  MCV 81.3 82.0  PLT 38* 24*     Coagulation:  Recent Labs  02/11/14 1423  INR 1.04    Urine Drug Screen: Drugs of Abuse     Component Value Date/Time   LABOPIA NONE DETECTED 02/11/2014 1414   COCAINSCRNUR POSITIVE* 02/11/2014 1414   LABBENZ NONE DETECTED 02/11/2014 1414   AMPHETMU NONE DETECTED 02/11/2014 1414   THCU NONE DETECTED 02/11/2014 1414   LABBARB NONE DETECTED 02/11/2014 1414     Alcohol Level:  Recent Labs  02/11/14 1423  ETH 344*    Urinalysis    Component Value Date/Time   COLORURINE YELLOW 02/11/2014 1414   APPEARANCEUR CLOUDY* 02/11/2014 1414   LABSPEC 1.017 02/11/2014 1414   PHURINE 6.0 02/11/2014 1414   GLUCOSEU NEGATIVE 02/11/2014 1414   HGBUR NEGATIVE 02/11/2014 1414   BILIRUBINUR MODERATE* 02/11/2014 1414   KETONESUR 15* 02/11/2014 1414   PROTEINUR NEGATIVE 02/11/2014 1414   UROBILINOGEN 4.0* 02/11/2014 1414   NITRITE  POSITIVE* 02/11/2014 1414   LEUKOCYTESUR SMALL* 02/11/2014 1414    Urine microscopic:  Recent Labs  02/11/14 1414  EPIU FEW*  WBCU 3-6  RBCU 0-2  BACTERIA FEW*  LABCAST HYALINE CASTS*     Other results: EKG: Sinus tachycardia; nonspecific ST and T wave abnormality  Assessment & Plan by Problem:  1.  Depression with suicidal ideation.  Plan is suicide precautions with a sitter; psychiatry consult.  2.  Alcoholism.  Patient states that she would like help with her alcoholism.  Plan is CIWA protocol; thiamine/MVI/folate; psychiatry consult as above.  3.  Sinus Tachycardia.  Possibly due to volume depletion.  Plan is IV volume replacement; follow orthostatics; check TSH.  4.  Pancytopenia.  Initial labs showed thrombocytopenia, and today  pancytopenia; the decrease in other cell lines on repeat CBC likely represents dilutional effect of IV fluid.  The pancytopenia is most likely due to alcoholism.  Patient has no signs of bleeding.  Abdominal ultrasound is pending to evaluate elevated liver enzymes, and this should show if there is splenomegaly.   5.  Elevated liver enzymes.  Likely due to alcoholism; plan is follow liver enzymes; check hepatitis panel; abdominal ultrasound.  6.  Other problems and plans as per the resident physician's note.  7.  Dr. Lynnae January will return as attending physician tomorrow 02/13/2014.

## 2014-02-12 NOTE — Progress Notes (Signed)
Weekend CSW informed that patient referred for psychiatric consult. Weekend CSW spoke with MD about patient case. CSW will await psychiatrist recommendations and assist with discharge planning/support as necessary.  Tilden Fossa, MSW, [place] Clinical Social Worker [facility] Emergency Dept. [phone]

## 2014-02-12 NOTE — Progress Notes (Signed)
UR Completed.  Sherry Baker [id number] 02/12/2014

## 2014-02-12 NOTE — Progress Notes (Signed)
Subjective: Patient seen at bedside this AM. No longer intoxicated, but starting to show mild signs of alcohol withdrawal. Denies any nausea, or vomiting. Also denies dysuria, chest pain, SOB, dizziness, or lightheadedness.   Objective: Vital signs in last 24 hours: Filed Vitals:   02/12/14 0714 02/12/14 0800 02/12/14 1000 02/12/14 1300  BP: 115/78 147/86 126/88 156/88  Pulse: 104 99 97 97  Temp:  97.5 F (36.4 C)  98.5 F (36.9 C)  TempSrc:  Oral  Oral  Resp: '25 21  18  ' Height:      Weight:      SpO2: 99% 99%  92%   Weight change:   Intake/Output Summary (Last 24 hours) at 02/12/14 1330 Last data filed at 02/12/14 1321  Gross per 24 hour  Intake   2778 ml  Output    550 ml  Net   2228 ml   Physical Exam: General: Alert, cooperative, NAD. Mildly tremulous.  HEENT: PERRL, EOMI. Moist mucus membranes Neck: Full range of motion without pain, supple, no lymphadenopathy or carotid bruits Lungs: Clear to ascultation bilaterally, normal work of respiration, mild end expiratory wheeze anteriorly, no rales, or rhonchi Heart: Tachycardic, RR, no murmurs, gallops, or rubs Abdomen: Soft, non-tender, non-distended, BS + Extremities: No cyanosis, clubbing, or edema Neurologic: Alert & oriented X3, cranial nerves II-XII intact, strength grossly intact, sensation intact to light touch  Lab Results: Basic Metabolic Panel:  Recent Labs Lab 02/11/14 1423 02/11/14 2148 02/12/14 0405  NA 137  --  143  K 3.6*  --  3.3*  CL 90*  --  100  CO2 22  --  24  GLUCOSE 75  --  80  BUN 5*  --  5*  CREATININE 0.49*  --  0.40*  CALCIUM 8.6  --  8.1*  MG  --  1.9  --   PHOS  --  3.7  --    Liver Function Tests:  Recent Labs Lab 02/11/14 1423 02/12/14 0405  AST 642* 454*  ALT 167* 125*  ALKPHOS 173* 136*  BILITOT 2.9* 2.7*  PROT 7.9 6.6  ALBUMIN 3.9 3.0*    Recent Labs Lab 02/11/14 1518  LIPASE 70*   CBC:  Recent Labs Lab 02/11/14 1423 02/12/14 0405  WBC 4.5 2.7*    HGB 13.2 11.0*  HCT 37.5 32.4*  MCV 81.3 82.0  PLT 38* 24*   Coagulation:  Recent Labs Lab 02/11/14 1423  LABPROT 13.4  INR 1.04   Urine Drug Screen: Drugs of Abuse     Component Value Date/Time   LABOPIA NONE DETECTED 02/11/2014 1414   COCAINSCRNUR POSITIVE* 02/11/2014 1414   LABBENZ NONE DETECTED 02/11/2014 1414   AMPHETMU NONE DETECTED 02/11/2014 1414   THCU NONE DETECTED 02/11/2014 1414   LABBARB NONE DETECTED 02/11/2014 1414    Alcohol Level:  Recent Labs Lab 02/11/14 1423  ETH 344*   Urinalysis:  Recent Labs Lab 02/11/14 1414  COLORURINE YELLOW  LABSPEC 1.017  PHURINE 6.0  GLUCOSEU NEGATIVE  HGBUR NEGATIVE  BILIRUBINUR MODERATE*  KETONESUR 15*  PROTEINUR NEGATIVE  UROBILINOGEN 4.0*  NITRITE POSITIVE*  LEUKOCYTESUR SMALL*   Micro Results: Recent Results (from the past 240 hour(s))  MRSA PCR SCREENING     Status: None   Collection Time    02/11/14  6:31 PM      Result Value Ref Range Status   MRSA by PCR NEGATIVE  NEGATIVE Final   Comment:  The GeneXpert MRSA Assay (FDA     approved for NASAL specimens     only), is one component of a     comprehensive MRSA colonization     surveillance program. It is not     intended to diagnose MRSA     infection nor to guide or     monitor treatment for     MRSA infections.   Medications: I have reviewed the patient's current medications. Scheduled Meds: . ciprofloxacin  250 mg Oral BID  . folic acid  1 mg Intravenous Daily  . multivitamin with minerals  1 tablet Oral Daily  . potassium chloride  40 mEq Oral Once  . sodium chloride  3 mL Intravenous Q12H  . sodium chloride  3 mL Intravenous Q12H  . thiamine  100 mg Intravenous Daily   Continuous Infusions: . 0.9 % NaCl with KCl 20 mEq / L 125 mL/hr at 02/12/14 1050   PRN Meds:.sodium chloride, LORazepam, promethazine, sodium chloride  Assessment/Plan: [name] is a 48 y.o. female w/ PMHx of HTN, alcohol/substance abuse, Depression,  and Bipolar I, admitted for alcohol intoxication and suicidal ideations.   Suicidal Ideations- Patient admits to recent suicidal ideations, as recent as yesterday. However, the patient claims she is not currently suicidal. She denies having a plan. The patient does admit to depressed mood, claims she has been compliant with Prozac and Trazodone since started ~1 month ago. Follows at [facility]. -Psychiatry consulted, recs pending -Suicide precautions -Restart Prozac + Trazodone  Alcohol & Substance Abuse- EtOh of 344 on admission. Admits to drinking AT LEAST five 40 oz beers daily since her father past away in 10/2013. UDS positive for cocaine. Tachycardic and tremulous on exam, w/ some anxiety as well. Will likely have withdrawal soon. Mag + Phos normal. HIV negative. -CIWA protocol  -IVFs; NS + 20 mEq K @ 135 cc/hr -Thiamine + Folic acid  -Social work consulted -Continue cardiac monitoring  -Phenergan 12.5 mg q6h prn  UTI- Denies dysuria, however, admits to urgency and increased frequency. UA positive for nitrites. -Cipro 250 mg bid for 3 days   Transaminitis- Patient w/ AST of 642, ALT 167, total bili 2.9, Alk Phos 173, on admission. Trending down today. Likely 2/2 excessive alcohol intake. Acute Hepatitis panel negative, HIV negative.  -Abdominal US pending  Thrombocytopenia- Admitted with platelets of 38, found to be 24 this AM. No obvious signs of bleeding at this time. Likely 2/2 alcohol abuse. -Continue to monitor.  -Hold VTE prophylaxis; SCD's for now   HTN- Normotensive on admission. Not on any home meds for HTN.  -Continue to monitor.   DVT/PE PPx- SCD's  Dispo: Disposition is deferred at this time, awaiting improvement of current medical problems.  Anticipated discharge in approximately 1-2 day(s).   The patient does not have a current PCP (No Pcp Per Patient) and does not need an [facility] hospital follow-up appointment after discharge.  The patient does not have transportation  limitations that hinder transportation to clinic appointments.  .Services Needed at time of discharge: Y = Yes, Blank = No PT:   OT:   RN:   Equipment:   Other:     LOS: 1 day   Corky Sox, MD 02/12/2014, 1:30 PM

## 2014-02-13 ENCOUNTER — Inpatient Hospital Stay (HOSPITAL_COMMUNITY): Payer: Self-pay

## 2014-02-13 DIAGNOSIS — F1994 Other psychoactive substance use, unspecified with psychoactive substance-induced mood disorder: Secondary | ICD-10-CM

## 2014-02-13 DIAGNOSIS — F1020 Alcohol dependence, uncomplicated: Principal | ICD-10-CM

## 2014-02-13 DIAGNOSIS — E43 Unspecified severe protein-calorie malnutrition: Secondary | ICD-10-CM | POA: Diagnosis present

## 2014-02-13 DIAGNOSIS — E876 Hypokalemia: Secondary | ICD-10-CM

## 2014-02-13 LAB — COMPREHENSIVE METABOLIC PANEL
ALBUMIN: 2.7 g/dL — AB (ref 3.5–5.2)
ALT: 120 U/L — ABNORMAL HIGH (ref 0–35)
AST: 375 U/L — ABNORMAL HIGH (ref 0–37)
Alkaline Phosphatase: 162 U/L — ABNORMAL HIGH (ref 39–117)
BILIRUBIN TOTAL: 2.7 mg/dL — AB (ref 0.3–1.2)
BUN: 3 mg/dL — AB (ref 6–23)
CO2: 23 meq/L (ref 19–32)
CREATININE: 0.36 mg/dL — AB (ref 0.50–1.10)
Calcium: 8.5 mg/dL (ref 8.4–10.5)
Chloride: 94 mEq/L — ABNORMAL LOW (ref 96–112)
GFR calc Af Amer: >90 mL/min (ref >90)
Glucose, Bld: 101 mg/dL — ABNORMAL HIGH (ref 70–99)
Potassium: 2.9 mEq/L — CL (ref 3.7–5.3)
Sodium: 134 mEq/L — ABNORMAL LOW (ref 137–147)
Total Protein: 6.3 g/dL (ref 6.0–8.3)

## 2014-02-13 LAB — CBC
HCT: 33.5 % — ABNORMAL LOW (ref 36.0–46.0)
Hemoglobin: 11.4 g/dL — ABNORMAL LOW (ref 12.0–15.0)
MCH: 27.9 pg (ref 26.0–34.0)
MCHC: 34 g/dL (ref 30.0–36.0)
MCV: 81.9 fL (ref 78.0–100.0)
PLATELETS: 23 10*3/uL — AB (ref 150–400)
RBC: 4.09 MIL/uL (ref 3.87–5.11)
RDW: 21.2 % — AB (ref 11.5–15.5)
WBC: 2.3 10*3/uL — ABNORMAL LOW (ref 4.0–10.5)

## 2014-02-13 LAB — BASIC METABOLIC PANEL
BUN: 3 mg/dL — ABNORMAL LOW (ref 6–23)
CALCIUM: 9.2 mg/dL (ref 8.4–10.5)
CO2: 23 mEq/L (ref 19–32)
Chloride: 95 mEq/L — ABNORMAL LOW (ref 96–112)
Creatinine, Ser: 0.44 mg/dL — ABNORMAL LOW (ref 0.50–1.10)
GFR calc Af Amer: >90 mL/min (ref >90)
Glucose, Bld: 111 mg/dL — ABNORMAL HIGH (ref 70–99)
POTASSIUM: 3.4 meq/L — AB (ref 3.7–5.3)
SODIUM: 134 meq/L — AB (ref 137–147)

## 2014-02-13 LAB — URINE CULTURE: Colony Count: 55000

## 2014-02-13 LAB — MAGNESIUM
MAGNESIUM: 1.3 mg/dL — AB (ref 1.5–2.5)
MAGNESIUM: 1.9 mg/dL (ref 1.5–2.5)

## 2014-02-13 MED ORDER — POTASSIUM CHLORIDE 10 MEQ/100ML IV SOLN
10.0000 meq | INTRAVENOUS | Status: AC
  Administered 2014-02-13 (×3): 10 meq via INTRAVENOUS
  Filled 2014-02-13 (×4): qty 100

## 2014-02-13 MED ORDER — TRAZODONE HCL 50 MG PO TABS
50.0000 mg | ORAL_TABLET | Freq: Every day | ORAL | Status: DC
  Filled 2014-02-13: qty 1

## 2014-02-13 MED ORDER — MAGNESIUM SULFATE 50 % IJ SOLN
4.0000 g | Freq: Once | INTRAVENOUS | Status: AC
  Administered 2014-02-13: 4 g via INTRAVENOUS
  Filled 2014-02-13: qty 8

## 2014-02-13 MED ORDER — FLUOXETINE HCL 10 MG PO CAPS
10.0000 mg | ORAL_CAPSULE | Freq: Every day | ORAL | Status: DC
  Filled 2014-02-13: qty 1

## 2014-02-13 MED ORDER — FLUOXETINE HCL 20 MG PO CAPS
20.0000 mg | ORAL_CAPSULE | Freq: Every day | ORAL | Status: DC
  Administered 2014-02-14: 20 mg via ORAL
  Filled 2014-02-13: qty 1

## 2014-02-13 MED ORDER — ENSURE COMPLETE PO LIQD
237.0000 mL | Freq: Two times a day (BID) | ORAL | Status: DC
  Administered 2014-02-13 – 2014-02-14 (×2): 237 mL via ORAL

## 2014-02-13 MED ORDER — TRAZODONE HCL 100 MG PO TABS
100.0000 mg | ORAL_TABLET | Freq: Every day | ORAL | Status: DC
  Administered 2014-02-13: 100 mg via ORAL
  Filled 2014-02-13 (×2): qty 1

## 2014-02-13 NOTE — Progress Notes (Signed)
Date of notification:  02/13/2014  Time of notification:  7001  Critical value read back: Yes K 2.9  Nurse who received alert:  R. Sanjuana Mae RN  MD notified (1st page):  Dr. Denton Brick  Time of first page:  0505  MD notified (2nd page):  Time of second page:  Responding MD: Dr. Denton Brick  Time MD responded: [phone]

## 2014-02-13 NOTE — Progress Notes (Signed)
INITIAL NUTRITION ASSESSMENT  DOCUMENTATION CODES Per approved criteria  -Severe malnutrition in the context of chronic illness   Pt meets criteria for severe MALNUTRITION in the context of chronic illness as evidenced by weight loss >7.5%/ 3 months and </= 75% for >/= 1 month.    INTERVENTION: Ensure Complete (strawberry) BID, each supplement providing 350 kcals and 13 grams of protein  Monitor magnesium, potassium, and phosphorus daily during nutrition repletion, MD to replete as needed, as pt is at risk for refeeding syndrome given severe malnutrition. RD to continue to follow nutrition care plan  NUTRITION DIAGNOSIS: Malnutrition related to poor PO intake due to loss of appetite as evidenced by 0-25% of meal intake for several months and 17% weight loss in 2 months.    Goal: Meet >/= 90% of estimated nutrition needs   Monitor:  PO intake, weight status, supplement acceptance, labs   Reason for Assessment: Positive Malnutrition Screening Tool Score   48 y.o. female  Admitting Dx: Suicidal ideation  ASSESSMENT: 48 year old woman with history of alcohol and substance abuse, depression, bipolar disorder, hypertension, and other problems as outlined in the medical history, who presented to the emergency department complaining of depression and suicidal ideation.  Pt may be d/c home today, pending psych eval.  Patient reports poor appetite and poor PO intake. Pt is eating 0-25% of meals at home and in the hospital. Pt has had significant weight loss since February, (17% in 2 months). Pt states that her normal weight is 140 lbs and her last weigh in was 123 lbs at [facility] in February.   Pt reports a dietary recall of no food and only drinking beer for several months. Patient is at risk for refeeding, replete magnesium and phosphorus as necessary.   Per nutrition focused physical exam, Pt did not have any severe muscle or fat depletion, however she reported weakness and inability to  walk.   Low sodium, potassium, and magnesium   Nutrition Focused Physical Exam:  Subcutaneous Fat:  Orbital Region: WNL Upper Arm Region: moderate depletion  Thoracic and Lumbar Region: n/a   Muscle:  Temple Region: WNL Clavicle Bone Region: WNL Clavicle and Acromion Bone Region: WNL Scapular Bone Region: WNL Dorsal Hand: WNL Patellar Region: WNL Anterior Thigh Region: WNL Posterior Calf Region: WNL   Edema: none    Height: Ht Readings from Last 1 Encounters:  02/11/14 5\' 1"  (1.549 m)    Weight: Wt Readings from Last 1 Encounters:  02/11/14 102 lb 11.8 oz (46.6 kg)    Ideal Body Weight: 105 lbs   % Ideal Body Weight: 97%   Wt Readings from Last 10 Encounters:  02/11/14 102 lb 11.8 oz (46.6 kg)  08/01/12 149 lb (67.586 kg)  11/05/11 145 lb (65.772 kg)    Usual Body Weight: 140 lbs   % Usual Body Weight: 73%   BMI:  Body mass index is 19.42 kg/(m^2).  Estimated Nutritional Needs: Kcal: 1400 - 1600  Protein: 60-70 g  Fluid: >/=1.5 L   Skin: WDL   Diet Order: General  EDUCATION NEEDS: -No education needs identified at this time   Intake/Output Summary (Last 24 hours) at 02/13/14 1327 Last data filed at 02/13/14 1136  Gross per 24 hour  Intake 2264.17 ml  Output   2880 ml  Net -615.83 ml    Last BM: 4/13    Labs:   Recent Labs Lab 02/11/14 1423 02/11/14 2148 02/12/14 0405 02/13/14 0254  NA 137  --  143 134*  K 3.6*  --  3.3* 2.9*  CL 90*  --  100 94*  CO2 22  --  24 23  BUN 5*  --  5* 3*  CREATININE 0.49*  --  0.40* 0.36*  CALCIUM 8.6  --  8.1* 8.5  MG  --  1.9  --  1.3*  PHOS  --  3.7  --   --   GLUCOSE 75  --  80 101*    CBG (last 3)  No results found for this basename: GLUCAP,  in the last 72 hours No results found for this basename: HGBA1C   Lipid Panel  No results found for this basename: chol, trig, hdl, cholhdl, vldl, ldlcalc    Scheduled Meds: . ciprofloxacin  250 mg Oral BID  . folic acid  1 mg Intravenous  Daily  . magnesium sulfate LVP 250-500 ml  4 g Intravenous Once  . multivitamin with minerals  1 tablet Oral Daily  . thiamine  100 mg Intravenous Daily    Continuous Infusions:   Past Medical History  Diagnosis Date  . Hypertension   . Alcohol abuse   . Cirrhosis of liver   . Bipolar 1 disorder   . Depression     Past Surgical History  Procedure Laterality Date  . Mandible fracture surgery      Sherry Baker, BS Nutrition Intern Pager: [phone]  I agree with the above information and made appropriate revisions. Inda Coke MS, RD, LDN Inpatient Registered Dietitian Pager: [phone] After-hours pager: [phone]

## 2014-02-13 NOTE — Progress Notes (Addendum)
Safety maintained . Patient was seen by Dr. Louretta Shorten this afternoon (see notes under "icomplete note" column.

## 2014-02-13 NOTE — Consult Note (Signed)
Reason for Consult: Depression alcohol intoxication and suicidal ideation Referring Physician: Corky Sox, MD  Sherry Baker is an 48 y.o. female.  History of Present Illness: Patient was seen and chart reviewed. Patient has been heavily drinking beer 4 x 40 ounces daily since her dad passed away in 11-24-2013 and has been off of her medication for depression. she has been compliant with her treatment while in hospital. She has been depressed, isolated, disturbance of sleep and appetite and abdomen pain but denied suicidal or homicidal ideation, intention or plans. She has been staying by herself and has few friends. Patient stated that she made suicidal statement while intoxicated and upset but now she has been sober and denied suicidal thoughts. She has denied being in danger to herself and others.   She has requested to restart her medication prozac 20 mg and trazodone 100 mg Qhs and willing to follow up at [facility] when medically cleared. The patient also admitted to drinking to excess every day, saying that she will always drink one 40 oz. Chucky May in the morning to "get some motivation" and will then proceed with he day. She says she usually drinks ~5 40 oz. Beers daily. She was unable to say how much she drank today however. She also admitted to smoking crack-cocaine 2 days ago, but denies chronic use. She also denies IVDA. The patient also mentioned that she was homeless and [place] with a friend, but was unable to elaborate further on her living situation.   Mental Status Examination: Patient appeared as per his stated age, poorly groomed, and had fair eye contact. Patient has depressed mood and her affect was constricted. She has normal rate, rhythm, and low volume of speech. Her thought process is linear and goal directed. Patient has denied suicidal, homicidal ideations, intentions or plans. Patient has no evidence of auditory or visual hallucinations, delusions, and paranoia.  Patient has fair to poor insight judgment and impulse control.  Past Medical History  Diagnosis Date  . Hypertension   . Alcohol abuse   . Cirrhosis of liver   . Bipolar 1 disorder   . Depression     Past Surgical History  Procedure Laterality Date  . Mandible fracture surgery      No family history on file.  Social History:  reports that she has been smoking Cigarettes.  She has a 10 pack-year smoking history. She does not have any smokeless tobacco history on file. She reports that she drinks alcohol. She reports that she uses illicit drugs (Marijuana).  Allergies:  Allergies  Allergen Reactions  . Penicillins     rash    Medications: I have reviewed the patient's current medications.  Results for orders placed during the hospital encounter of 02/11/14 (from the past 48 hour(s))  MRSA PCR SCREENING     Status: None   Collection Time    02/11/14  6:31 PM      Result Value Ref Range   MRSA by PCR NEGATIVE  NEGATIVE   Comment:            The GeneXpert MRSA Assay (FDA     approved for NASAL specimens     only), is one component of a     comprehensive MRSA colonization     surveillance program. It is not     intended to diagnose MRSA     infection nor to guide or     monitor treatment for  MRSA infections.  LACTIC ACID, PLASMA     Status: None   Collection Time    02/11/14  9:41 PM      Result Value Ref Range   Lactic Acid, Venous 1.3  0.5 - 2.2 mmol/L  MAGNESIUM     Status: None   Collection Time    02/11/14  9:48 PM      Result Value Ref Range   Magnesium 1.9  1.5 - 2.5 mg/dL  PHOSPHORUS     Status: None   Collection Time    02/11/14  9:48 PM      Result Value Ref Range   Phosphorus 3.7  2.3 - 4.6 mg/dL  COMPREHENSIVE METABOLIC PANEL     Status: Abnormal   Collection Time    02/12/14  4:05 AM      Result Value Ref Range   Sodium 143  137 - 147 mEq/L   Potassium 3.3 (*) 3.7 - 5.3 mEq/L   Chloride 100  96 - 112 mEq/L   Comment: DELTA CHECK NOTED    CO2 24  19 - 32 mEq/L   Glucose, Bld 80  70 - 99 mg/dL   BUN 5 (*) 6 - 23 mg/dL   Creatinine, Ser 0.40 (*) 0.50 - 1.10 mg/dL   Calcium 8.1 (*) 8.4 - 10.5 mg/dL   Total Protein 6.6  6.0 - 8.3 g/dL   Albumin 3.0 (*) 3.5 - 5.2 g/dL   AST 454 (*) 0 - 37 U/L   ALT 125 (*) 0 - 35 U/L   Alkaline Phosphatase 136 (*) 39 - 117 U/L   Total Bilirubin 2.7 (*) 0.3 - 1.2 mg/dL   GFR calc non Af Amer >90  >90 mL/min   GFR calc Af Amer >90  >90 mL/min   Comment: (NOTE)     The eGFR has been calculated using the CKD EPI equation.     This calculation has not been validated in all clinical situations.     eGFR's persistently <90 mL/min signify possible Chronic Kidney     Disease.  CBC     Status: Abnormal   Collection Time    02/12/14  4:05 AM      Result Value Ref Range   WBC 2.7 (*) 4.0 - 10.5 K/uL   RBC 3.95  3.87 - 5.11 MIL/uL   Hemoglobin 11.0 (*) 12.0 - 15.0 g/dL   HCT 32.4 (*) 36.0 - 46.0 %   MCV 82.0  78.0 - 100.0 fL   MCH 27.8  26.0 - 34.0 pg   MCHC 34.0  30.0 - 36.0 g/dL   RDW 21.6 (*) 11.5 - 15.5 %   Platelets 24 (*) 150 - 400 K/uL   Comment: REPEATED TO VERIFY     PLATELET COUNT CONFIRMED BY SMEAR     CRITICAL RESULT CALLED TO, READ BACK BY AND VERIFIED WITH:     GRACOU R.,RN 02/12/14 0608 BY JONESJ  URINE CULTURE     Status: None   Collection Time    02/12/14  2:11 PM      Result Value Ref Range   Specimen Description URINE, RANDOM     Special Requests NONE     Culture  Setup Time       Value: 02/12/2014 21:54     Performed at [facility]       Value: 55,000 COLONIES/ML     Performed at [facility]  Value: Multiple bacterial morphotypes present, none predominant. Suggest appropriate recollection if clinically indicated.     Performed at [facility]   Report Status 02/13/2014 FINAL    CBC     Status: Abnormal   Collection Time    02/13/14  2:54 AM      Result Value Ref Range   WBC 2.3 (*) 4.0 - 10.5 K/uL    Comment: WHITE COUNT CONFIRMED ON SMEAR     REPEATED TO VERIFY   RBC 4.09  3.87 - 5.11 MIL/uL   Hemoglobin 11.4 (*) 12.0 - 15.0 g/dL   HCT 33.5 (*) 36.0 - 46.0 %   MCV 81.9  78.0 - 100.0 fL   MCH 27.9  26.0 - 34.0 pg   MCHC 34.0  30.0 - 36.0 g/dL   RDW 21.2 (*) 11.5 - 15.5 %   Platelets 23 (*) 150 - 400 K/uL   Comment: PLATELET COUNT CONFIRMED BY SMEAR     REPEATED TO VERIFY     CRITICAL VALUE NOTED.  VALUE IS CONSISTENT WITH PREVIOUSLY REPORTED AND CALLED VALUE.  COMPREHENSIVE METABOLIC PANEL     Status: Abnormal   Collection Time    02/13/14  2:54 AM      Result Value Ref Range   Sodium 134 (*) 137 - 147 mEq/L   Comment: DELTA CHECK NOTED   Potassium 2.9 (*) 3.7 - 5.3 mEq/L   Comment: CRITICAL RESULT CALLED TO, READ BACK BY AND VERIFIED WITH:     Maryruth Eve (RN) [phone] 02/13/2014 L. LOMAX   Chloride 94 (*) 96 - 112 mEq/L   CO2 23  19 - 32 mEq/L   Glucose, Bld 101 (*) 70 - 99 mg/dL   BUN 3 (*) 6 - 23 mg/dL   Creatinine, Ser 0.36 (*) 0.50 - 1.10 mg/dL   Calcium 8.5  8.4 - 10.5 mg/dL   Total Protein 6.3  6.0 - 8.3 g/dL   Albumin 2.7 (*) 3.5 - 5.2 g/dL   AST 375 (*) 0 - 37 U/L   ALT 120 (*) 0 - 35 U/L   Alkaline Phosphatase 162 (*) 39 - 117 U/L   Total Bilirubin 2.7 (*) 0.3 - 1.2 mg/dL   GFR calc non Af Amer >90  >90 mL/min   GFR calc Af Amer >90  >90 mL/min   Comment: (NOTE)     The eGFR has been calculated using the CKD EPI equation.     This calculation has not been validated in all clinical situations.     eGFR's persistently <90 mL/min signify possible Chronic Kidney     Disease.  MAGNESIUM     Status: Abnormal   Collection Time    02/13/14  2:54 AM      Result Value Ref Range   Magnesium 1.3 (*) 1.5 - 2.5 mg/dL    US Abdomen Complete  02/13/2014   CLINICAL DATA:  Hypertension, alcohol abuse, cirrhosis, elevated LFTs, thrombus side  EXAM: ULTRASOUND ABDOMEN COMPLETE  COMPARISON:  None.  FINDINGS: Gallbladder:  No gallstones or wall thickening visualized. No  sonographic Murphy sign noted.  Common bile duct:  Diameter: 5.2 mm  Liver:  Diffuse increased echogenicity compatible with hepatic steatosis or fatty infiltration. No focal hepatic abnormality. No biliary dilatation. Patent portal vein with normal hepatopetal flow.  IVC:  No abnormality visualized.  Pancreas:  Visualized portion unremarkable.  Spleen:  Size and appearance within normal limits.  Right Kidney:  Length: 13 cm. Echogenicity within normal limits. No mass  or hydronephrosis visualized.  Left Kidney:  Length: 12.6 cm. Echogenicity within normal limits. No mass or hydronephrosis visualized.  Abdominal aorta:  No aneurysm visualized.  Other findings:  None.  IMPRESSION: Hepatic steatosis.  No other acute intra-abdominal process.   Electronically Signed   By: Daryll Brod M.D.   On: 02/13/2014 08:30    Positive for anorexia, anxiety, bad mood, depression, excessive alcohol consumption and sleep disturbance Blood pressure 128/84, pulse 104, temperature 98 F (36.7 C), temperature source Oral, resp. rate 29, height '5\' 1"'  (1.549 m), weight 46.6 kg (102 lb 11.8 oz), SpO2 96.00%.   Assessment/Plan: Alcohol dependence Substance induced mood disorder  Recommendation:  Patient does not meet criteria of acute psychiatric hospitalization as she does not have safety concerns at this time Recommend discontinue safety sitter Recommend out patient psychiatric treatment at [facility] when medically stable Patient may benefit from substance rehabilitation if interested at [facility] recovery services Appreciate psych consult and will sign off at this time  May start Prozac 20 mg Qam and trazodone 100 mg Qhs May contact [phone] if needs further assistance  Durward Parcel 02/13/2014, 3:28 PM

## 2014-02-13 NOTE — Progress Notes (Signed)
  Date: 02/13/2014  Patient name: Sherry Baker  Medical record number: [id number]  Date of birth: [birth year]   This patient has been seen and the plan of care was discussed with the house staff. Please see their note for complete details. I concur with their findings with the following additions/corrections:  SI / Depression - for pysch eval. If stable for D/C, will need to F/U [facility].  Alcoholism - no signs / sxs DT's. No insurance for naltrexone. Social work to provide tx [place]. Plts low, LFT's high, albumin low, U/S shows hepatic steatosis but no ascites. There is concern for cirrhosis and oupt GI F/U for endoscopy rec if she can manage it.   Home today if pysch clears her. F/U [facility].   Bartholomew Crews, MD 02/13/2014, 12:50 PM

## 2014-02-13 NOTE — Evaluation (Signed)
Physical Therapy Evaluation Patient Details Name: Sherry Baker MRN: [id number] DOB: 05-27-1966 Today's Date: 02/13/2014   History of Present Illness  Patient is a 48 year old woman with history of alcohol and substance abuse, depression, bipolar disorder, hypertension, and other problems as outlined in the medical history, who presented to the emergency department complaining of depression and suicidal ideation  Clinical Impression  Pt pleasant with flat affect and willing to mobilize. Pt with very unsteady gait and reports multiple falls this year. Pt with below and above deficits who will benefit from acute therapy to maximize mobility, function, balance and independence to decrease fall risk and burden of care.   Follow Up Recommendations Home health PT;Supervision/Assistance - 24 hour, if not able to stay with boyfriend and provide supervision/assist for mobility would recommend ST-SNF    Equipment Recommendations  Rolling walker with 5" wheels    Recommendations for Other Services       Precautions / Restrictions Precautions Precautions: Fall      Mobility  Bed Mobility Overal bed mobility: Modified Independent             General bed mobility comments: with use of rail to sit  Transfers Overall transfer level: Needs assistance   Transfers: Sit to/from Stand Sit to Stand: Min guard         General transfer comment: guarding for safety and balance with cues for hand placement to control descent to surface  Ambulation/Gait Ambulation/Gait assistance: Min assist Ambulation Distance (Feet): 180 Feet Assistive device: Rolling walker (2 wheeled);None Gait Pattern/deviations: Drifts right/left;Narrow base of support;Shuffle   Gait velocity interpretation: Below normal speed for age/gender General Gait Details: without RW but with left lean with slightly trendelenburg gait with drifting bil direction and unsteady gait. With use of RW decreased lean and drift with gait  but continued need for assist for balance and avoiding obstacles  Stairs            Wheelchair Mobility    Modified Rankin (Stroke Patients Only)       Balance Overall balance assessment: Needs assistance   Sitting balance-Leahy Scale: Fair       Standing balance-Leahy Scale: Poor   Single Leg Stance - Right Leg: 1 Single Leg Stance - Left Leg: 1     Rhomberg - Eyes Opened: 40 Rhomberg - Eyes Closed: 3   High Level Balance Comments: with turning at chair pt with LOB with mod assist to recover             Pertinent Vitals/Pain No pain HR 105-140 with hypokalemia being repleted    Home Living Family/patient expects to be discharged to:: Private residence Living Arrangements: Spouse/significant other Available Help at Discharge: Friend(s) Type of Home: Apartment Home Access: Level entry     Home Layout: One level Home Equipment: None      Prior Function Level of Independence: Independent         Comments: pt reports she will go to boyfriend's apartment when she leaves. She normally performs her own ADLs but reports >61falls this year     Hand Dominance        Extremity/Trunk Assessment   Upper Extremity Assessment: Generalized weakness           Lower Extremity Assessment: Generalized weakness;RLE deficits/detail;LLE deficits/detail RLE Deficits / Details: 3+/5 hip flexion, knee flexion and extension LLE Deficits / Details: 3+/5 hip flexion, knee flexion and extension  Cervical / Trunk Assessment: Normal  Communication   Communication: No difficulties  Cognition Arousal/Alertness: Awake/alert Behavior During Therapy: Flat affect Overall Cognitive Status: Impaired/Different from baseline Area of Impairment: Orientation;Safety/judgement Orientation Level: Time       Safety/Judgement: Decreased awareness of deficits;Decreased awareness of safety          General Comments      Exercises        Assessment/Plan    PT  Assessment Patient needs continued PT services  PT Diagnosis Difficulty walking;Abnormality of gait;Generalized weakness   PT Problem List Decreased strength;Decreased balance;Decreased cognition;Decreased knowledge of use of DME;Decreased safety awareness;Decreased activity tolerance;Cardiopulmonary status limiting activity  PT Treatment Interventions Gait training;Functional mobility training;Therapeutic activities;Therapeutic exercise;Patient/family education;Cognitive remediation;Neuromuscular re-education;Balance training;DME instruction   PT Goals (Current goals can be found in the Care Plan section) Acute Rehab PT Goals Patient Stated Goal: be able to get better PT Goal Formulation: With patient Time For Goal Achievement: 02/27/14 Potential to Achieve Goals: Fair    Frequency Min 3X/week   Barriers to discharge Decreased caregiver support      Co-evaluation               End of Session Equipment Utilized During Treatment: Gait belt Activity Tolerance: Patient tolerated treatment well Patient left: in chair;with call bell/phone within reach;with nursing/sitter in room Nurse Communication: Mobility status         Time: 0915-0929 PT Time Calculation (min): 14 min   Charges:   PT Evaluation $Initial PT Evaluation Tier I: 1 Procedure PT Treatments $Gait Training: 8-22 mins   PT G Codes:          Jamion Carter B Thamas Appleyard 02/13/2014, 12:10 PM Elwyn Reach, [place]

## 2014-02-13 NOTE — Progress Notes (Signed)
Subjective: Patient seen at bedside this AM. She still needs to be evaluated by psychiatry.  No complaints today.   Objective: Vital signs in last 24 hours: Filed Vitals:   02/13/14 0002 02/13/14 0200 02/13/14 0400 02/13/14 0805  BP: 118/76 126/70 105/53 116/63  Pulse: 100 98 98 97  Temp: 98.7 F (37.1 C)  99 F (37.2 C) 97.9 F (36.6 C)  TempSrc: Oral  Oral Oral  Resp: '30 27 19 20  ' Height:      Weight:      SpO2: 93% 93% 96% 96%   Weight change:   Intake/Output Summary (Last 24 hours) at 02/13/14 1133 Last data filed at 02/13/14 1044  Gross per 24 hour  Intake 2384.17 ml  Output   2630 ml  Net -245.83 ml   Physical Exam: General: Alert, cooperative, NAD. Resting in chair. Neck: Full range of motion without pain, supple, no lymphadenopathy or carotid bruits Lungs: Clear to ascultation bilaterally, normal work of respiration, mild end expiratory wheeze anteriorly, no rales, or rhonchi Heart: Tachycardia, no murmurs, gallops, rubs Abdomen: Soft, non-tender, non-distended, BS + Extremities: moving all 4 extremities. No LE edema. Neurologic: Alert & oriented X3, cranial nerves II-XII intact, strength grossly intact, sensation intact to light touch  Lab Results: Basic Metabolic Panel:  Recent Labs Lab 02/11/14 1423 02/11/14 2148 02/12/14 0405 02/13/14 0254  NA 137  --  143 134*  K 3.6*  --  3.3* 2.9*  CL 90*  --  100 94*  CO2 22  --  24 23  GLUCOSE 75  --  80 101*  BUN 5*  --  5* 3*  CREATININE 0.49*  --  0.40* 0.36*  CALCIUM 8.6  --  8.1* 8.5  MG  --  1.9  --  1.3*  PHOS  --  3.7  --   --    Liver Function Tests:  Recent Labs Lab 02/12/14 0405 02/13/14 0254  AST 454* 375*  ALT 125* 120*  ALKPHOS 136* 162*  BILITOT 2.7* 2.7*  PROT 6.6 6.3  ALBUMIN 3.0* 2.7*    Recent Labs Lab 02/11/14 1518  LIPASE 70*   CBC:  Recent Labs Lab 02/12/14 0405 02/13/14 0254  WBC 2.7* 2.3*  HGB 11.0* 11.4*  HCT 32.4* 33.5*  MCV 82.0 81.9  PLT 24* 23*    Coagulation:  Recent Labs Lab 02/11/14 1423  LABPROT 13.4  INR 1.04   Urine Drug Screen: Drugs of Abuse     Component Value Date/Time   LABOPIA NONE DETECTED 02/11/2014 1414   COCAINSCRNUR POSITIVE* 02/11/2014 1414   LABBENZ NONE DETECTED 02/11/2014 1414   AMPHETMU NONE DETECTED 02/11/2014 1414   THCU NONE DETECTED 02/11/2014 1414   LABBARB NONE DETECTED 02/11/2014 1414    Alcohol Level:  Recent Labs Lab 02/11/14 1423  ETH 344*   Urinalysis:  Recent Labs Lab 02/11/14 1414  COLORURINE YELLOW  LABSPEC 1.017  PHURINE 6.0  GLUCOSEU NEGATIVE  HGBUR NEGATIVE  BILIRUBINUR MODERATE*  KETONESUR 15*  PROTEINUR NEGATIVE  UROBILINOGEN 4.0*  NITRITE POSITIVE*  LEUKOCYTESUR SMALL*   Micro Results: Recent Results (from the past 240 hour(s))  MRSA PCR SCREENING     Status: None   Collection Time    02/11/14  6:31 PM      Result Value Ref Range Status   MRSA by PCR NEGATIVE  NEGATIVE Final   Comment:            The GeneXpert MRSA Assay (FDA  approved for NASAL specimens     only), is one component of a     comprehensive MRSA colonization     surveillance program. It is not     intended to diagnose MRSA     infection nor to guide or     monitor treatment for     MRSA infections.   Medications: I have reviewed the patient's current medications.  Scheduled Meds: . ciprofloxacin  250 mg Oral BID  . folic acid  1 mg Intravenous Daily  . magnesium sulfate LVP 250-500 ml  4 g Intravenous Once  . multivitamin with minerals  1 tablet Oral Daily  . thiamine  100 mg Intravenous Daily   Continuous Infusions:   PRN Meds:.LORazepam, promethazine  Assessment/Plan: [name] is a 48 y.o. female w/ PMHx of HTN, alcohol/substance abuse, Depression, and Bipolar I, admitted for alcohol intoxication and suicidal ideations.   Suicidal Ideations- Has considered suicidal thoughts, especially after death of father.  Follows at [facility] as outpatient. -Psychiatry consulted,  recs pending -Suicide precautions continued, sitter in room -Continue Prozac + Trazodone  Alcohol & Substance Abuse- EtOh of 344 on admission, daily drinker, since her father past away in 10/2013. UDS positive for cocaine but denies use. Tachycardic and tremulous on exam, w/ some anxiety as well. Will likely have withdrawal soon. Mag + Phos normal. HIV negative. -CIWA protocol step down -Thiamine + Folic acid  -Social work consulted -Continue cardiac monitoring  -Phenergan 12.5 mg q6h prn  UTI- Denies dysuria, however, admits to urgency and increased frequency. UA positive for nitrites. -Cipro 250 mg bid for 3 days--today is day 2  Transaminitis- Patient w/ AST of 642, ALT 167, total bili 2.9, Alk Phos 173, on admission. Trending down today. Likely 2/2 excessive alcohol intake. Acute Hepatitis panel negative, HIV negative. Abdominal US performed, shows hepatic steatosis or fatty infiltration. -Continue to monitor  Thrombocytopenia- Admitted with platelets of 38, remains in 14s. No obvious signs of bleeding at this time. Likely 2/2 alcohol abuse. -Continue to monitor.  -Hold VTE prophylaxis; SCD's for now   HTN- Normotensive on admission. Not on any home meds for HTN.  -Continue to monitor.   Hypokalemia and Hypomagnesemia--K 2.9 today with Mag 1.3. Replaced this AM. EKG shows sinus tachycardia, w/ non-specific ST/T wave abnormalities. -Repeat BMET later today -Supplement as necessary  DVT/PE PPx- SCD's  Diet: Regular  Dispo: Disposition is deferred at this time, awaiting improvement of current medical problems.  Anticipated discharge in approximately 1-2 day(s). Pending psychiatric evaluation.   The patient does not have a current PCP (No Pcp Per Patient) and does not need an [facility] hospital follow-up appointment after discharge.  The patient does not have transportation limitations that hinder transportation to clinic appointments.  Services Needed at time of discharge: Y = Yes,  Blank = No PT:   OT:   RN:   Equipment:   Other:     LOS: 2 days   Jerene Pitch, MD 02/13/2014, 11:33 AM

## 2014-02-14 DIAGNOSIS — F1910 Other psychoactive substance abuse, uncomplicated: Secondary | ICD-10-CM

## 2014-02-14 LAB — BASIC METABOLIC PANEL
BUN: 5 mg/dL — ABNORMAL LOW (ref 6–23)
CALCIUM: 8.7 mg/dL (ref 8.4–10.5)
CO2: 25 mEq/L (ref 19–32)
Chloride: 96 mEq/L (ref 96–112)
Creatinine, Ser: 0.45 mg/dL — ABNORMAL LOW (ref 0.50–1.10)
GLUCOSE: 100 mg/dL — AB (ref 70–99)
POTASSIUM: 3 meq/L — AB (ref 3.7–5.3)
Sodium: 135 mEq/L — ABNORMAL LOW (ref 137–147)

## 2014-02-14 LAB — CBC
HCT: 34.8 % — ABNORMAL LOW (ref 36.0–46.0)
Hemoglobin: 11.7 g/dL — ABNORMAL LOW (ref 12.0–15.0)
MCH: 27.9 pg (ref 26.0–34.0)
MCHC: 33.6 g/dL (ref 30.0–36.0)
MCV: 82.9 fL (ref 78.0–100.0)
Platelets: 39 10*3/uL — ABNORMAL LOW (ref 150–400)
RBC: 4.2 MIL/uL (ref 3.87–5.11)
RDW: 22 % — AB (ref 11.5–15.5)
WBC: 4 10*3/uL (ref 4.0–10.5)

## 2014-02-14 LAB — MAGNESIUM: MAGNESIUM: 1.7 mg/dL (ref 1.5–2.5)

## 2014-02-14 MED ORDER — POTASSIUM CHLORIDE CRYS ER 20 MEQ PO TBCR
40.0000 meq | EXTENDED_RELEASE_TABLET | Freq: Once | ORAL | Status: AC
  Administered 2014-02-14: 40 meq via ORAL
  Filled 2014-02-14: qty 2

## 2014-02-14 MED ORDER — POTASSIUM CHLORIDE 20 MEQ/15ML (10%) PO LIQD
40.0000 meq | Freq: Once | ORAL | Status: AC
  Administered 2014-02-14: 40 meq via ORAL
  Filled 2014-02-14: qty 30

## 2014-02-14 MED ORDER — TRAZODONE HCL 100 MG PO TABS: 100.0000 mg | ORAL_TABLET | Freq: Every evening | ORAL | Status: DC | PRN

## 2014-02-14 MED ORDER — FLUOXETINE HCL 20 MG PO CAPS: 20.0000 mg | ORAL_CAPSULE | Freq: Every day | ORAL | Status: DC

## 2014-02-14 MED ORDER — MAGNESIUM SULFATE 4000MG/100ML IJ SOLN
4.0000 g | Freq: Once | INTRAMUSCULAR | Status: DC
  Filled 2014-02-14: qty 100

## 2014-02-14 MED ORDER — FOLIC ACID 1 MG PO TABS: 1.0000 mg | ORAL_TABLET | Freq: Every day | ORAL | Status: DC

## 2014-02-14 MED ORDER — VITAMIN B-1 100 MG PO TABS: 100.0000 mg | ORAL_TABLET | Freq: Every day | ORAL | Status: DC

## 2014-02-14 MED ORDER — MAGNESIUM SULFATE 40 MG/ML IJ SOLN
2.0000 g | Freq: Once | INTRAMUSCULAR | Status: AC
  Administered 2014-02-14: 2 g via INTRAVENOUS
  Filled 2014-02-14: qty 50

## 2014-02-14 MED ORDER — MAGNESIUM SULFATE 50 % IJ SOLN
2.0000 g | Freq: Once | INTRAVENOUS | Status: DC
  Filled 2014-02-14: qty 4

## 2014-02-14 NOTE — Clinical Documentation Improvement (Signed)
Possible Clinical Conditions?  Encephalopathy: Alcoholic  Other Condition Supporting Information: Risk Factors: alcohol and substance abuse, depression, bipolar disorder, homeless, lifestyle of chronic alcohol Signs & Symptoms: intoxicated on presentation, not able to answer some questions; When asked,  patient was unable to answer questions about appetite, irritability, sleep disturbances, concentration, etc. BMI: 19.5 Diagnostics: confusion, short-term memory loss, poor nutritional habits,  Treatment: alcohol cessation Thank You, Joya Salm ,RN Clinical Documentation Specialist:  [place] [employer]

## 2014-02-14 NOTE — Progress Notes (Signed)
Clinical Social Work Department BRIEF PSYCHOSOCIAL ASSESSMENT 02/14/2014  Patient:  Sherry Baker, Sherry Baker     Account Number:  [id number]     Admit date:  02/11/2014  Clinical Social Worker:  Freeman Caldron  Date/Time:  02/14/2014 03:35 PM  Referred by:  Physician  Date Referred:  02/14/2014 Referred for  SNF Placement  Substance Abuse Treatment   Other Referral:   Interview type:  Other - See comment Other interview type:  Consult from RN, RNCM. Psych consulted.  PSYCHOSOCIAL DATA Living Status:  ALONE Admitted from facility:   Level of care:   Primary support name:  [name] [phone]) Primary support relationship to patient:  FAMILY Degree of support available:   Fair--pt states she is homeless and will discharge to stay with an uncle.    CURRENT CONCERNS Current Concerns  Post-Acute Placement   Other Concerns:    SOCIAL WORK ASSESSMENT / PLAN CSW consulted for short-term SNF. PT recommending home health/supervision 24/7. Pt states she will have 24/7 supervision. Psych consulted, as pt came to hospital inebriated. Per psych CSW:  "psychiatry has evaluated the pt and recommended outpatient f/u with [facility] and/or [facility].  Psych CSW met with pt at bedside to review resources and educate patient on resource navigation. Pt agreeable for f/u.  Psych CSW also included the follow up resources on the dc summary." RNCM arranged home health RN for pt, who states she is going to uncle's house at discharge.   Assessment/plan status:  No Further Intervention Required Other assessment/ plan:   Information/referral to community resources:   Psych CSW provided pt with resources to follow up with [facility] and/or [facility]. RNCM arranged home health RN.    PATIENT'S/FAMILY'S RESPONSE TO PLAN OF CARE: N/A--pt discharged; psych CSW worked with pt directly       Ky Barban, MSW, [place] Social Worker [phone]

## 2014-02-14 NOTE — Progress Notes (Signed)
Subjective: Patient seen at bedside this AM. Says she is feeling much better today. No nausea this morning, no tremulousness or anxiety. Denies suicidal thinking today. Discussed possibility of going to [facility] and says she would seriously consider this option. No further complaints today.  Objective: Vital signs in last 24 hours: Filed Vitals:   02/13/14 2057 02/14/14 0048 02/14/14 0455 02/14/14 0818  BP: 126/80 113/74 116/70 121/73  Pulse: 99 94 96 84  Temp: 99.3 F (37.4 C) 99.4 F (37.4 C) 99.6 F (37.6 C) 99.6 F (37.6 C)  TempSrc: Oral Oral Oral Oral  Resp: '20 24 22 22  ' Height:      Weight:      SpO2: 95% 96% 97% 94%   Weight change:   Intake/Output Summary (Last 24 hours) at 02/14/14 8676 Last data filed at 02/13/14 2230  Gross per 24 hour  Intake    880 ml  Output   1900 ml  Net  -1020 ml   Physical Exam: General: Alert, cooperative, NAD.  Neck: Full range of motion without pain, supple, no lymphadenopathy or carotid bruits Lungs: Clear to ascultation bilaterally, normal work of respiration, mild end expiratory wheeze anteriorly, no rales, or rhonchi Heart: RRR. no murmurs, gallops, rubs Abdomen: Soft, non-tender, non-distended, BS + Extremities: Moving all 4 extremities. No LE edema. Neurologic: Alert & oriented X3, cranial nerves II-XII intact, strength grossly intact, sensation intact to light touch  Lab Results: Basic Metabolic Panel:  Recent Labs Lab 02/11/14 1423 02/11/14 2148  02/13/14 1925 02/14/14 0230  NA 137  --   < > 134* 135*  K 3.6*  --   < > 3.4* 3.0*  CL 90*  --   < > 95* 96  CO2 22  --   < > 23 25  GLUCOSE 75  --   < > 111* 100*  BUN 5*  --   < > 3* 5*  CREATININE 0.49*  --   < > 0.44* 0.45*  CALCIUM 8.6  --   < > 9.2 8.7  MG  --  1.9  < > 1.9 1.7  PHOS  --  3.7  --   --   --   < > = values in this interval not displayed. Liver Function Tests:  Recent Labs Lab 02/12/14 0405 02/13/14 0254  AST 454* 375*  ALT 125* 120*    ALKPHOS 136* 162*  BILITOT 2.7* 2.7*  PROT 6.6 6.3  ALBUMIN 3.0* 2.7*    Recent Labs Lab 02/11/14 1518  LIPASE 70*   CBC:  Recent Labs Lab 02/12/14 0405 02/13/14 0254  WBC 2.7* 2.3*  HGB 11.0* 11.4*  HCT 32.4* 33.5*  MCV 82.0 81.9  PLT 24* 23*   Coagulation:  Recent Labs Lab 02/11/14 1423  LABPROT 13.4  INR 1.04   Urine Drug Screen: Drugs of Abuse     Component Value Date/Time   LABOPIA NONE DETECTED 02/11/2014 1414   COCAINSCRNUR POSITIVE* 02/11/2014 1414   LABBENZ NONE DETECTED 02/11/2014 1414   AMPHETMU NONE DETECTED 02/11/2014 1414   THCU NONE DETECTED 02/11/2014 1414   LABBARB NONE DETECTED 02/11/2014 1414    Alcohol Level:  Recent Labs Lab 02/11/14 1423  ETH 344*   Urinalysis:  Recent Labs Lab 02/11/14 1414  COLORURINE YELLOW  LABSPEC 1.017  PHURINE 6.0  GLUCOSEU NEGATIVE  HGBUR NEGATIVE  BILIRUBINUR MODERATE*  KETONESUR 15*  PROTEINUR NEGATIVE  UROBILINOGEN 4.0*  NITRITE POSITIVE*  LEUKOCYTESUR SMALL*   Micro Results: Recent Results (  from the past 240 hour(s))  MRSA PCR SCREENING     Status: None   Collection Time    02/11/14  6:31 PM      Result Value Ref Range Status   MRSA by PCR NEGATIVE  NEGATIVE Final   Comment:            The GeneXpert MRSA Assay (FDA     approved for NASAL specimens     only), is one component of a     comprehensive MRSA colonization     surveillance program. It is not     intended to diagnose MRSA     infection nor to guide or     monitor treatment for     MRSA infections.  URINE CULTURE     Status: None   Collection Time    02/12/14  2:11 PM      Result Value Ref Range Status   Specimen Description URINE, RANDOM   Final   Special Requests NONE   Final   Culture  Setup Time     Final   Value: 02/12/2014 21:54     Performed at [facility] Count     Final   Value: 55,000 COLONIES/ML     Performed at [facility]   Culture     Final   Value: Multiple bacterial  morphotypes present, none predominant. Suggest appropriate recollection if clinically indicated.     Performed at [facility]   Report Status 02/13/2014 FINAL   Final   Medications: I have reviewed the patient's current medications.  Scheduled Meds: . ciprofloxacin  250 mg Oral BID  . feeding supplement (ENSURE COMPLETE)  237 mL Oral BID BM  . FLUoxetine  20 mg Oral Daily  . folic acid  1 mg Intravenous Daily  . magnesium sulfate 1 - 4 g bolus IVPB  2 g Intravenous Once  . multivitamin with minerals  1 tablet Oral Daily  . potassium chloride  40 mEq Oral Once  . thiamine  100 mg Intravenous Daily  . traZODone  100 mg Oral QHS   Continuous Infusions:   PRN Meds:.LORazepam, promethazine  Assessment/Plan: Sherry Baker is a 49 y.o. female w/ PMHx of HTN, alcohol/substance abuse, Depression, and Bipolar I, admitted for alcohol intoxication and suicidal ideations.   Suicidal Ideations- No suicidal thoughts since 02/11/14. Seen by psychiatry yesterday, feel that patient is stable for discharge. Follows at [facility] as outpatient. -D/c Suicide precautions + sitter -Continue Prozac + Trazodone -Will schedule follow up at [facility] on discharge.  Alcohol & Substance Abuse- EtOh of 344 on admission, daily drinker, since her father past away in 10/2013. UDS positive for cocaine, admitted to use 2 days prior to admission. No obvious signs of withdrawal this morning. Discussed [employer], patient says she will seriously consider this.  -CIWA protocol -Thiamine + Folic acid  -Social work consulted -Phenergan 12.5 mg q6h prn  UTI- Denies dysuria, however, admits to urgency and increased frequency. UA positive for nitrites. -Cipro 250 mg bid for 3 days (day 3/3)  Transaminitis- Patient w/ AST of 642, ALT 167, total bili 2.9, Alk Phos 173, on admission. Trending down. Likely 2/2 excessive alcohol intake. Acute Hepatitis panel negative, HIV negative. Abdominal US performed,  shows hepatic steatosis or fatty infiltration. -Continue to monitor -Repeat CMP as an outpatient.  Thrombocytopenia- Admitted with platelets of 38, remains in 20s. No obvious signs of bleeding at this time. Likely 2/2 alcohol  abuse. -Continue to monitor.  -Hold VTE prophylaxis; SCD's for now   HTN- Normotensive on admission. Not on any home meds for HTN.  -Continue to monitor.   Hypokalemia and Hypomagnesemia--K 3.0 today with Mag 1.7. Supplemented this AM. -Will send out on K-dur 20 mEq qd + Magnesium Oxalate   DVT/PE PPx- SCD's  Diet: Regular  Dispo: Anticipated discharge today.  The patient does not have a current PCP (No Pcp Per Patient) and does not need an [facility] hospital follow-up appointment after discharge.  The patient does not have transportation limitations that hinder transportation to clinic appointments.  Services Needed at time of discharge: Y = Yes, Blank = No PT:   OT:   RN:   Equipment:   Other:     LOS: 3 days   Corky Sox, MD 02/14/2014, 8:21 AM

## 2014-02-14 NOTE — Discharge Summary (Signed)
Name: Sherry Baker MRN: [id number] DOB: 1966-10-08 48 y.o. PCP: No Pcp Per Patient  Date of Admission: 02/11/2014  2:00 PM Date of Discharge: 02/14/2014 Attending Physician: Lynnae January  Discharge Diagnosis: 1. Suicidal Ideations 2. Polysubstance Abuse 3. Transaminitis 4. Thrombocytopenia 5. Hypomagnesemia, hypokalemia  Discharge Medications:   Medication List         albuterol 108 (90 BASE) MCG/ACT inhaler  Commonly known as:  PROVENTIL HFA;VENTOLIN HFA  Inhale 2 puffs into the lungs every 6 (six) hours as needed for wheezing or shortness of breath.     FLUoxetine 20 MG capsule  Commonly known as:  PROZAC  Take 1 capsule (20 mg total) by mouth daily.     magnesium oxide 400 MG tablet  Commonly known as:  MAG-OX  Take 1 tablet (400 mg total) by mouth daily.     Potassium Chloride ER 20 MEQ Tbcr  Take 20 mEq by mouth daily.     traZODone 100 MG tablet  Commonly known as:  DESYREL  Take 1 tablet (100 mg total) by mouth at bedtime as needed for sleep.       Disposition and follow-up:   [name] was discharged from [facility] in Good condition.  At the hospital follow up visit please address:  1.  Depression; Please assess patient's mood, question of suicidal ideation and compliance w/ Prozac. Please have patient go to [facility] for further management.   Alcohol intake; has patient cut down? Has she considered [facility] rehab facility?  Electrolyte abnormalities; Please assess if patient has been taking mag + K at home. Recheck BMP. Given new prescription if she has not received these.   2.  Labs / imaging needed at time of follow-up: BMP, magnesium  3.  Pending labs/ test needing follow-up: none  Follow-up Appointments: Follow-up Information   Follow up with [facility].   Contact information:   [address] [place] [address] [phone]      Follow up with [facility]     On 02/21/2014. (2:00  PM)    Contact information:   [place] [id number] [phone]      Follow up with [facility]. (walk-in only M-F 8:30am-3:30pm)    Contact information:   [place] [address] [phone]      Follow up with [facility] ([facility] [phone])       Discharge Instructions: Discharge Orders   Future Appointments Provider Department Dept Phone   02/21/2014 2:00 PM Chari Manning, NP [place] [phone]   Future Orders Complete By Expires   Call MD for:  extreme fatigue  As directed    Call MD for:  persistant dizziness or light-headedness  As directed    Call MD for:  persistant nausea and vomiting  As directed       Consultations: Treatment Team:  Durward Parcel, MD  Procedures Performed:  US Abdomen Complete  02/13/2014   CLINICAL DATA:  Hypertension, alcohol abuse, cirrhosis, elevated LFTs, thrombus side  EXAM: ULTRASOUND ABDOMEN COMPLETE  COMPARISON:  None.  FINDINGS: Gallbladder:  No gallstones or wall thickening visualized. No sonographic Murphy sign noted.  Common bile duct:  Diameter: 5.2 mm  Liver:  Diffuse increased echogenicity compatible with hepatic steatosis or fatty infiltration. No focal hepatic abnormality. No biliary dilatation. Patent portal vein with normal hepatopetal flow.  IVC:  No abnormality  visualized.  Pancreas:  Visualized portion unremarkable.  Spleen:  Size and appearance within normal limits.  Right Kidney:  Length: 13 cm. Echogenicity within normal limits. No mass or hydronephrosis visualized.  Left Kidney:  Length: 12.6 cm. Echogenicity within normal limits. No mass or hydronephrosis visualized.  Abdominal aorta:  No aneurysm visualized.  Other findings:  None.  IMPRESSION: Hepatic steatosis.  No other acute intra-abdominal process.   Electronically Signed   By: Daryll Brod M.D.   On: 02/13/2014 08:30    Admission HPI: [name] is a 48 y.o. female w/ PMHx of HTN, alcohol/substance abuse, Depression, and Bipolar I, presents to the ED w/ alcohol intoxication and suicidal ideations. When interviewed, patient was quite intoxicated, not able to answer some questions. The patient does claim that she has been very depressed lately, ever since her mother passed away, but she was not able to say when that was. It was also noted in previous ED notes that her father also passed away in 11-28-13. The patient does claim that she sees someone at [facility] and says she has been taking Trazodone and Prozac for about a month now and does claim to have been compliant with these medications. When asked, patient patient was unable to answer questions about appetite, irritability, sleep disturbances, concentration, etc. However, she did deny suicidal ideations 3x during the interview.  The patient also admitted to drinking to excess every day, saying that she will always drink one 40 oz. Chucky May in the morning to "get some motivation" and will then proceed with he day. She says she usually drinks ~5 40 oz. Beers daily. She was unable to say how much she drank today however. She also admitted to smoking crack-cocaine 2 days ago, but denies chronic use. She also denies IVDA.  The patient also mentioned that she was homeless and [place], but was unable to elaborate further on her living situation.  She otherwise denies any nausea, abdominal pain, chest pain, SOB, LE swelling, dysuria, or hematuria.   Hospital Course by problem list:   1. Suicidal Ideations- On arrival to the ED, patient admitted to wanting to harm herself, however, when interviewed at bedside by the medical team, denied suicidal ideations 3x. Given her significant alcohol intoxication, still unclear if this is truly the case. Patient claims she has been seeing someone at [facility] and has been taking Prozac and Trazodone for ~ 1  month. She claims she has been very compliant with these medications saying that she is almost in need of a refill and is supposed to go back to [place] soon. The patient was unable to appropriately answer questions about sleep, appetite, concentration, energy level, etc. She did admit to being quite depressed lately and confirmed that she has lost both of her parents recently. According to ED notes, the patient's father passed away in 28-Nov-2013. Psychiatry consulted in ED, however, patient too intoxicated to be appropriately evaluated. Re-evaluated on 02/13/14, felt that patient patient was stable for discharge w/ no suicidal thoughts since admission. Patient to continue Prozac + Trazodone, to follow up w/ [facility].   2. Polysubstance Abuse- Patient admitted to excessive alcohol intake, EtOh of 344 on admission. Also seen in the ED on 02/01/14, EtOh 446 at that time. Patient also has UDS positive for cocaine and admitted to smoking crack 2 days prior, but denies chronic use. Also denied IVDA. Tylenol + salicylates undetectable. Per ED notes, the patient has been to detox 5  times in the past, however, the last time was 10 years ago. Given a banana bag, placed on telemetry, CIWA protocol.   3. Transaminitis- Patient w/ AST of 642, ALT 167, total bili 2.9, Alk Phos 173, increased from previous ED visit on 02/01/14. Likely 2/2 excessive alcohol intake. Acute hep panel and HIV found to be negative. LFT's slowly trended down during admission. Abdominal US performed, showed fatty liver changes.   4. Thrombocytopenia- Admitted with platelets of 38, also shown to be 58 on 02/01/14. This appears to be a relatively recent issue, given that her platelets were as high as 199 on 11/23/11. No obvious signs of bleeding. Trend as follows:  Recent Labs Lab 02/12/14 0405 02/13/14 0254 02/14/14 1240  HGB 11.0* 11.4* 11.7*  HCT 32.4* 33.5* 34.8*  WBC 2.7* 2.3* 4.0  PLT 24* 23* 39*  Likely 2/2 alcohol use.   5. Hypomagnesemia,  hypokalemia- Mag as low as 1.3 on 02/13/14. Repleted both adequately. Given Mag-Ox 400 mg po qd + K-dur 20 mg po qd on discharge. Will need BMP + mag repeated at follow up.   Discharge Vitals:   BP 102/72  Pulse 88  Temp(Src) 98.5 F (36.9 C) (Oral)  Resp 21  Ht '5\' 1"'  (1.549 m)  Wt 102 lb 11.8 oz (46.6 kg)  BMI 19.42 kg/m2  SpO2 94%  Discharge Labs:  No results found for this or any previous visit (from the past 24 hour(s)).  Signed: Corky Sox, MD 02/17/2014, 1:36 PM   Time Spent on Discharge: 35 minutes Services Ordered on Discharge: none Equipment Ordered on Discharge: none

## 2014-02-14 NOTE — Care Management Note (Addendum)
    Page 1 of 1   02/14/2014     2:52:25 PM   CARE MANAGEMENT NOTE 02/14/2014  Patient:  Sherry Baker   Account Number:  [id number]  Date Initiated:  02/14/2014  Documentation initiated by:  Addasyn Mcbreen  Subjective/Objective Assessment:   dx ETOH/substance abuse, SI; reports she is homeless     In-house referral  Clinical Environmental education officer  CM consult      Choice offered to / List presented to:  C-1 Patient   DME arranged  Vassie Moselle      DME agency  [facility] arranged  HH-1 RN      [place].   Status of service:  Completed, signed off  Discharge Disposition:  [place]  Per UR Regulation:  Reviewed for med. necessity/level of care/duration of stay  Comments:  02/14/14 [place] work consult placed for information re rehab and shelter resources. 66 Pt now says she will be living with her uncle when he moves at the end of April, and a friend has offered to let pt stay with her until then.  PT recommends home health PT but self-pay pt must satisfy MCD criteria to get PT and and pt does not qualify. Pt can get home health RN for restorative nursing, agrees to same, referral made per choice of agency.  Dr Ronnald Ramp has arranged appt with [facility] and [facility].

## 2014-02-14 NOTE — Clinical Social Work Psych Note (Signed)
Psychiatry has evaluated the pt and recommended outpatient f/u with [facility] and/or [facility].  Psych CSW met with pt at bedside to review resources and educate patient on resource navigation. Pt agreeable for f/u.  Psych CSW also included the follow up resources on the dc summary.  Psych CSW signing off, but remains available for assistance as needed.  [name], [place] [phone]  Clinical Social Work

## 2014-02-14 NOTE — Discharge Instructions (Signed)
1. Please see follow up appointments as follows:    2. Please take all medications as prescribed. Please continue to take Prozac 20 mg daily + Trazodone 100 mg at night.  3. If you have worsening of your symptoms or new symptoms arise, please call the clinic [phone]), or go to the ER immediately if symptoms are severe.  Alcohol and Nutrition Nutrition serves two purposes. It provides energy. It also maintains body structure and function. Food supplies energy. It also provides the building blocks needed to replace worn or damaged cells. Alcoholics often eat poorly. This limits their supply of essential nutrients. This affects energy supply and structure maintenance. Alcohol also affects the body's nutrients in:  Digestion.  Storage.  Using and getting rid of waste products. IMPAIRMENT OF NUTRIENT DIGESTION AND UTILIZATION   Once ingested, food must be broken down into small components (digested). Then it is available for energy. It helps maintain body structure and function. Digestion begins in the mouth. It continues in the stomach and intestines, with help from the pancreas. The nutrients from digested food are absorbed from the intestines into the blood. Then they are carried to the liver. The liver prepares nutrients for:  Immediate use.  Storage and future use.  Alcohol inhibits the breakdown of nutrients into usable molecules.  It decreases secretion of digestive enzymes from the pancreas.  Alcohol impairs nutrient absorption by damaging the cells lining the stomach and intestines.  It also interferes with moving some nutrients into the blood.  In addition, nutritional deficiencies themselves may lead to further absorption problems.  For example, folate deficiency changes the cells that line the small intestine. This impairs how water is absorbed. It also affects absorbed nutrients. These include glucose, sodium, and additional folate.  Even if nutrients are digested and  absorbed, alcohol can prevent them from being fully used. It changes their transport, storage, and excretion. Impaired utilization of nutrients by alcoholics is indicated by:  Decreased liver stores of vitamins, such as vitamin A.  Increased excretion of nutrients such as fat. ALCOHOL AND ENERGY SUPPLY   Three basic nutritional components found in food are:  Carbohydrates.  Proteins.  Fats.  These are used as energy. Some alcoholics take in as much as 50% of their total daily calories from alcohol. They often neglect important foods.  Even when enough food is eaten, alcohol can impair the ways the body controls blood sugar (glucose) levels. It may either increase or decrease blood sugar.  In non-diabetic alcoholics, increased blood sugar (hyperglycemia) is caused by poor insulin secretion. It is usually temporary.  Decreased blood sugar (hypoglycemia) can cause serious injury even if this condition is short-lived. Low blood sugar can happen when a fasting or malnourished person drinks alcohol. When there is no food to supply energy, stored sugar is used up. The products of alcohol inhibit forming glucose from other compounds such as amino acids. As a result, alcohol causes the brain and other body tissue to lack glucose. It is needed for energy and function.  Alcohol is an energy source. But how the body processes and uses the energy from alcohol is complex. Also, when alcohol is substituted for carbohydrates, subjects tend to lose weight. This indicates that they get less energy from alcohol than from food. ALCOHOL - MAINTAINING CELL STRUCTURE AND FUNCTION  Structure Cells are made mostly of protein. So an adequate protein diet is important for maintaining cell structure. This is especially true if cells are being damaged. Research indicates that  alcohol affects protein nutrition by causing impaired:  Digestion of proteins to amino acids.  Processing of amino acids by the small  intestine and liver.  Synthesis of proteins from amino acids.  Protein secretion by the liver. Function Nutrients are essential for the body to function well. They provide the tools that the body needs to work well:   Proteins.  Vitamins.  Minerals. Alcohol can disrupt body function. It may cause nutrient deficiencies. And it may interfere with the way nutrients are processed. Vitamins  Vitamins are essential to maintain growth and normal metabolism. They regulate many of the body`s processes. Chronic heavy drinking causes deficiencies in many vitamins. This is caused by eating less. And, in some cases, vitamins may be poorly absorbed. For example, alcohol inhibits fat absorption. It impairs how the vitamins A, E, and D are normally absorbed along with dietary fats. Not enough vitamin A may cause night blindness. Not enough vitamin D may cause softening of the bones.  Some alcoholics lack vitamins A, C, D, E, K, and the B vitamins. These are all involved in wound healing and cell maintenance. In particular, because vitamin K is necessary for blood clotting, lacking that vitamin can cause delayed clotting. The result is excess bleeding. Lacking other vitamins involved in brain function may cause severe neurological damage. Minerals Deficiencies of minerals such as calcium, magnesium, iron, and zinc are common in alcoholics. The alcohol itself does not seem to affect how these minerals are absorbed. Rather, they seem to occur secondary to other alcohol-related problems, such as:  Less calcium absorbed.  Not enough magnesium.  More urinary excretion.  Vomiting.  Diarrhea.  Not enough iron due to gastrointestinal bleeding.  Not enough zinc or losses related to other nutrient deficiencies.  Mineral deficiencies can cause a variety of medical consequences. These range from calcium-related bone disease to zinc-related night blindness and skin lesions. ALCOHOL, MALNUTRITION, AND MEDICAL  COMPLICATIONS  Liver Disease   Alcoholic liver damage is caused primarily by alcohol itself. But poor nutrition may increase the risk of alcohol-related liver damage. For example, nutrients normally found in the liver are known to be affected by drinking alcohol. These include carotenoids, which are the major sources of vitamin A, and vitamin E compounds. Decreases in such nutrients may play some role in alcohol-related liver damage. Pancreatitis  Research suggests that malnutrition may increase the risk of developing alcoholic pancreatitis. Research suggests that a diet lacking in protein may increase alcohol's damaging effect on the pancreas. Brain  Nutritional deficiencies may have severe effects on brain function. These may be permanent. Specifically, thiamine deficiencies are often seen in alcoholics. They can cause severe neurological problems. These include:  Impaired movement.  Memory loss seen in Wernicke-Korsakoff syndrome. Pregnancy  Alcohol has toxic effects on fetal development. It causes alcohol-related birth defects. They include fetal alcohol syndrome. Alcohol itself is toxic to the fetus. Also, the nutritional deficiency can affect how the fetus develops. That may compound the risk of developmental damage.  Nutritional needs during pregnancy are 10% to 30% greater than normal. Food intake can increase by as much as 140% to cover the needs of both mother and fetus. An alcoholic mother`s nutritional problems may adversely affect the nutrition of the fetus. And alcohol itself can also restrict nutrition flow to the fetus. NUTRITIONAL STATUS OF ALCOHOLICS  Techniques for assessing nutritional status include:  Taking body measurements to estimate fat reserves. They include:  Weight.  Height.  Mass.  Skin fold thickness.  Performing  blood analysis to provide measurements of circulating:  Proteins.  Vitamins.  Minerals.  These techniques tend to be imprecise. For  many nutrients, there is no clear "cut-off" point that would allow an accurate definition of deficiency. So assessing the nutritional status of alcoholics is limited by these techniques. Dietary status may provide information about the risk of developing nutritional problems. Dietary status is assessed by:  Taking patients' dietary histories.  Evaluating the amount and types of food they are eating.  It is difficult to determine what exact amount of alcohol begins to have damaging effects on nutrition. In general, moderate drinkers have 2 drinks or less per day. They seem to be at little risk for nutritional problems. Various medical disorders begin to appear at greater levels.  Research indicates that the majority of even the heaviest drinkers have few obvious nutritional deficiencies. Many alcoholics who are hospitalized for medical complications of their disease do have severe malnutrition. Alcoholics tend to eat poorly. Often they eat less than the amounts of food necessary to provide enough:  Carbohydrates.  Protein.  Fat.  Vitamins A and C.  B vitamins.  Minerals like calcium and iron. Of major concern is alcohol's effect on digesting food and use of nutrients. It may shift a mildly malnourished person toward severe malnutrition. Document Released: 08/14/2005 Document Revised: 01/12/2012 Document Reviewed: 01/28/2006 [facility] Patient Information 2014 [place].  Alcohol Problems Most adults who drink alcohol drink in moderation (not a lot) are at low risk for developing problems related to their drinking. However, all drinkers, including low-risk drinkers, should know about the health risks connected with drinking alcohol. RECOMMENDATIONS FOR LOW-RISK DRINKING  Drink in moderation. Moderate drinking is defined as follows:   Men - no more than 2 drinks per day.  Nonpregnant women - no more than 1 drink per day.  Over age 55 - no more than 1 drink per day. A standard  drink is 12 grams of pure alcohol, which is equal to a 12 ounce bottle of beer or wine cooler, a 5 ounce glass of wine, or 1.5 ounces of distilled spirits (such as whiskey, brandy, vodka, or rum).  ABSTAIN FROM (DO NOT DRINK) ALCOHOL:  When pregnant or considering pregnancy.  When taking a medication that interacts with alcohol.  If you are alcohol dependent.  A medical condition that prohibits drinking alcohol (such as ulcer, liver disease, or heart disease). DISCUSS WITH YOUR CAREGIVER:  If you are at risk for coronary heart disease, discuss the potential benefits and risks of alcohol use: Light to moderate drinking is associated with lower rates of coronary heart disease in certain populations (for example, men over age 63 and postmenopausal women). Infrequent or nondrinkers are advised not to begin light to moderate drinking to reduce the risk of coronary heart disease so as to avoid creating an alcohol-related problem. Similar protective effects can likely be gained through proper diet and exercise.  Women and the elderly have smaller amounts of body water than men. As a result women and the elderly achieve a higher blood alcohol concentration after drinking the same amount of alcohol.  Exposing a fetus to alcohol can cause a broad range of birth defects referred to as Fetal Alcohol Syndrome (FAS) or Alcohol-Related Birth Defects (ARBD). Although FAS/ARBD is connected with excessive alcohol consumption during pregnancy, studies also have reported neurobehavioral problems in infants born to mothers reporting drinking an average of 1 drink per day during pregnancy.  Heavier drinking (the consumption of more than  4 drinks per occasion by men and more than 3 drinks per occasion by women) impairs learning (cognitive) and psychomotor functions and increases the risk of alcohol-related problems, including accidents and injuries. CAGE QUESTIONS:   Have you ever felt that you should Cut down on your  drinking?  Have people Annoyed you by criticizing your drinking?  Have you ever felt bad or Guilty about your drinking?  Have you ever had a drink first thing in the morning to steady your nerves or get rid of a hangover (Eye opener)? If you answered positively to any of these questions: You may be at risk for alcohol-related problems if alcohol consumption is:   Men: Greater than 14 drinks per week or more than 4 drinks per occasion.  Women: Greater than 7 drinks per week or more than 3 drinks per occasion. Do you or your family have a medical history of alcohol-related problems, such as:  Blackouts.  Sexual dysfunction.  Depression.  Trauma.  Liver dysfunction.  Sleep disorders.  Hypertension.  Chronic abdominal pain.  Has your drinking ever caused you problems, such as problems with your family, problems with your work (or school) performance, or accidents/injuries?  Do you have a compulsion to drink or a preoccupation with drinking?  Do you have poor control or are you unable to stop drinking once you have started?  Do you have to drink to avoid withdrawal symptoms?  Do you have problems with withdrawal such as tremors, nausea, sweats, or mood disturbances?  Does it take more alcohol than in the past to get you high?  Do you feel a strong urge to drink?  Do you change your plans so that you can have a drink?  Do you ever drink in the morning to relieve the shakes or a hangover? If you have answered a number of the previous questions positively, it may be time for you to talk to your caregivers, family, and friends and see if they think you have a problem. Alcoholism is a chemical dependency that keeps getting worse and will eventually destroy your health and relationships. Many alcoholics end up dead, impoverished, or in prison. This is often the end result of all chemical dependency.  Do not be discouraged if you are not ready to take action  immediately.  Decisions to change behavior often involve up and down desires to change and feeling like you cannot decide.  Try to think more seriously about your drinking behavior.  Think of the reasons to quit. WHERE TO GO FOR ADDITIONAL INFORMATION   The National Institute on Alcohol Abuse and Alcoholism (NIAAA) BasicStudents.dk  [employer] on Alcoholism and Drug Dependence (NCADD) www.ncadd.org  American Society of Addiction Medicine (ASAM) RoyalDiary.gl  Document Released: 10/20/2005 Document Revised: 01/12/2012 Document Reviewed: 06/07/2008 [facility] Patient Information 2014 [place], [place].   Emergency Department Resource Guide 1) Find a Doctor and Pay Out of Pocket Although you won't have to find out who is covered by your insurance plan, it is a good idea to ask around and get recommendations. You will then need to call the office and see if the doctor you have chosen will accept you as a new patient and what types of options they offer for patients who are self-pay. Some doctors offer discounts or will set up payment plans for their patients who do not have insurance, but you will need to ask so you aren't surprised when you get to your appointment.  2) Contact Your Local Health Department Not all  health departments have doctors that can see patients for sick visits, but many do, so it is worth a call to see if yours does. If you don't know where your local health department is, you can check in your phone book. The CDC also has a tool to help you locate your state's health department, and many state websites also have listings of all of their local health departments.  3) Find a [facility] If your illness is not likely to be very severe or complicated, you may want to try a walk in clinic. These are popping up all over the country in pharmacies, drugstores, and shopping centers. They're usually staffed by nurse practitioners or physician assistants that have been  trained to treat common illnesses and complaints. They're usually fairly quick and inexpensive. However, if you have serious medical issues or chronic medical problems, these are probably not your best option.  No Primary Care Doctor: - Call Health Connect at  [phone] - they can help you locate a primary care doctor that  accepts your insurance, provides certain services, etc. - Physician Referral Service- [phone]  Chronic Pain Problems: Organization         Address  Phone   Notes  [facility]  [phone] Patients need to be referred by their primary care doctor.   Medication Assistance: Organization         Address  Phone   Notes  [facility] Medication [facility] [address] [place], [place] [address] [phone] --Must be a resident of [facility] -- Must have NO insurance coverage whatsoever (no Medicaid/ Medicare, etc.) -- The pt. MUST have a primary care doctor that directs their care regularly and follows them in the community   MedAssist  [phone]   [employer]  [phone]    Agencies that provide inexpensive medical care: Organization         Address  Phone   Notes  [place]  [phone]   [facility]    [phone]   [facility] [place], [place] [address] [phone]   [place] [address] [place] [phone]   Planned Parenthood    [phone]   [facility]    [phone]   [place] and [place] [address], [place] Phone:  [phone], Fax:  [phone] Hours of Operation:  9 am - 6 pm, M-F.  Also accepts Medicaid/Medicare and self-pay.  [facility] for [place] [address], [address], [place] Phone: [phone], Fax: [phone]. Hours of Operation:  8:30 am - 5:30 pm, M-F.  Also accepts Medicaid and self-pay.   [facility] [place] [address] [place] Phone: [phone]   [place], [place], [place] [phone], Ext. [phone] Mondays & Thursdays: 7-9 AM.  First 15 patients are seen on a first come, first serve basis.    [place] Providers:  Organization         Address  Phone   Notes  [facility] [address], [place] [phone] Also accepts self-pay patients.  [place], [place]  [phone]   [place], [place], [place] [phone]   [facility] [address] [place] [phone]   Lucianne Lei [address], [place]   [phone] Only accepts [place] Access [place] patients after they have their name applied to their card.   Self-Pay (no insurance) in [facility]:  Organization         Address  Phone   Notes  Sickle Cell Patients, [facility] [place] [phone]   [facility] [place] [phone]   [facility] [place]  [place], [place], [place] [phone]   [facility]/Dr. Osei-Bonsu  [address] [place] or [address], [place] [phone] Phone number for both [place] and [place] locations is the same.  [facility] [address], [place] [phone]   [facility] [address], [place] or [address] [phone]   [facility] [address] [place] [phone], phone; [phone], fax Sees patients 1st and 3rd Saturday of every month.  Must not qualify for public or private insurance (i.e. Medicaid, Medicare, [place] Health Choice, Veterans' Benefits)  Household income should be no  more than 200% of the poverty level The clinic cannot treat you if you are pregnant or think you are pregnant  Sexually transmitted diseases are not treated at the clinic.    Dental Care: Organization         Address  Phone  Notes  [facility] Department of [facility] [phone] Accepts children up to age 39 who are enrolled in [place] or [place]; pregnant women with a Medicaid card; and children who have applied for Medicaid or [place] Health Choice, but were declined, whose parents can pay a reduced fee at time of service.  [facility] Department of [facility]  [address], [place] [phone] Accepts children up to age 67 who are enrolled in [place] or [place]; pregnant women with a Medicaid card; and children who have applied for Medicaid or [place] Health Choice, but were declined, whose parents can pay a reduced fee at time of service.  [facility]  [place] [phone] Patients are seen by appointment only. Walk-ins are not accepted. [place] will see patients 62 years of age and older. Monday - Tuesday (8am-5pm) Most Wednesdays (8:30-5pm) $30 per visit, cash only  [facility] Adult Dental Access PROGRAM  [address], [facility] [phone] Patients are seen by appointment only. Walk-ins are not accepted. [place] will see patients 61 years of age and older. One Wednesday Evening (Monthly: Volunteer Based).  $30 per visit, cash only  [place]  [phone] for adults; Children under age 71, call Graduate Pediatric Dentistry at [phone]. Children aged 65-14, please call [phone] to request a pediatric application.  Dental services are provided in all areas of dental care including fillings, crowns and bridges, complete and partial dentures, implants, gum treatment, root canals,  and extractions. Preventive care is also provided. Treatment is provided to both adults and children. Patients are selected via a lottery and there is often a waiting list.   [facility] [address], [place]  [phone] [email or web address]   [facility]  [place], [place], [place] [phone], Ext. [phone] Second and Fourth Thursday of each month, opens at 6:30 AM; Clinic ends at 9 AM.  Patients are seen on a first-come first-served basis, and a limited number are seen during each clinic.   [facility]  [address] [place]ard Danker [place], [place] [phone]   Eligibility Requirements You must have lived in [place], [place], or [place] counties for at least the last three months.   You cannot be eligible for state or federal sponsored [employer], including [employer], [place], or [employer].   You generally cannot be eligible for healthcare insurance through your employer.    How to apply: Eligibility screenings are held every Tuesday and Wednesday afternoon from 1:00 pm until 4:00 pm. You do not need an appointment for the interview!  [facility] [address], [place], [place]   [place]  [place] Department  [place]  [phone]    Behavioral Health Resources in the Community: Intensive Outpatient Programs Organization         Address  Phone  Notes  Metaline Harwood. [address], [place], [place] [phone]   [facility] Outpatient [address] [place] [address] [place], [place]   ADS: Alcohol & Drug Svcs [address], [place], [place]   [place] [address]  [place], [place] or [phone]   Substance Abuse Resources Organization         Address  Phone  Notes  Alcohol [facility] Drug Services  [phone]    [place]  [phone]   [facility] [place]   Chinita Pester  [phone]   Residential & Outpatient Substance Abuse Program  [phone]   Psychological Services Organization         Address  Phone  Notes  [facility] [place]  [place]  [phone]   [place] [address], [place] or [phone]    Mobile Crisis Teams Organization         Address  Phone  Notes  [facility], Mobile Crisis Care Unit  [phone]   [facility]  [address] [place], [place]   [place] Levels [address] [place] [phone]    Self-Help/Support Groups Organization         Address  Phone             Notes  [place]. of [place] - variety of support groups  [place] Call for more information  Narcotics Anonymous (NA), [facility] [address], [employer] [place]  2 meetings at this location   Special educational needs teacher         Address  Phone  Notes  ASAP Residential Treatment [place],    [place]  [phone]   [facility]  [address] [place] [id number], [place], [place]   [place], [place] [phone] Admissions: 8am-3pm M-F  [facility] [place] [address]    [place], [place] [phone]   [facility] [address] [place], [place]   [facility] [place]  [place], [place] - Intensive Outpatient [address], [place], [place]   [facility] [address]  [place], [place] [phone] or [phone]   [facility] [address], [place], [place] Accepts Medicaid  [facility] [place] [address],   [place] [phone] Substance Abuse/Addiction Treatment   [facility]         Address  Phone  Notes  [facility]  [phone]   Domenic Schwab, PhD [address] [name], [place]   [phone] or [phone]   [facility] [place] [address] [place], [place] [phone]   [address], [place], [place] [phone] Insurance/Medicaid/sponsorship through [facility] [address], [address] [place]                                    [place], [place] [phone] [place] [address], [place] [phone]    Dr. Adele Schilder  [phone]   [facility] 1) [address], [place] 2) [place] 3)  [place] [address], [place] [phone]  [phone]   [place] [phone] or [phone] (After Hours)
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
## 2014-02-17 MED ORDER — POTASSIUM CHLORIDE ER 20 MEQ PO TBCR: 20.0000 meq | EXTENDED_RELEASE_TABLET | Freq: Every day | ORAL | Status: DC

## 2014-02-17 MED ORDER — MAGNESIUM OXIDE 400 MG PO TABS: 400.0000 mg | ORAL_TABLET | Freq: Every day | ORAL | Status: DC

## 2014-02-21 ENCOUNTER — Inpatient Hospital Stay: Payer: Self-pay | Admitting: Internal Medicine

## 2014-02-24 ENCOUNTER — Emergency Department (HOSPITAL_COMMUNITY)
Admission: EM | Admit: 2014-02-24 | Discharge: 2014-02-24 | Discharge status: Against Medical Advice | Payer: Self-pay | Attending: Emergency Medicine | Admitting: Emergency Medicine

## 2014-02-24 ENCOUNTER — Encounter (HOSPITAL_COMMUNITY): Payer: Self-pay | Admitting: Emergency Medicine

## 2014-02-24 DIAGNOSIS — F17200 Nicotine dependence, unspecified, uncomplicated: Secondary | ICD-10-CM | POA: Insufficient documentation

## 2014-02-24 DIAGNOSIS — I10 Essential (primary) hypertension: Secondary | ICD-10-CM | POA: Insufficient documentation

## 2014-02-24 DIAGNOSIS — F1010 Alcohol abuse, uncomplicated: Secondary | ICD-10-CM | POA: Insufficient documentation

## 2014-02-24 NOTE — ED Notes (Addendum)
Pt is intoxicated-admits to drinking "A LOT" of ETOH, recently Discharged from here 10 days ago. Pt states, "they called my uncle ronnies house and told me to come back because something is wrong with my liver." Pt did not speak with doctor nor did family with patient. Per patient the doctor called 3-4 days ago. No one is able to tell RN what was said. Unable to find a note that pt was called to come back. Pt is intoxicated and  States she has been living in the woods the last 10 days.  Abdomen is distended. She reports that she has a bladder infection. Denies pain.

## 2014-02-24 NOTE — ED Notes (Signed)
Pt stated, "I am not staying and waiting . I am leaving. I don't need any of this. I am going to rehab on Tuesday"

## 2014-04-09 ENCOUNTER — Emergency Department (HOSPITAL_COMMUNITY): Payer: Self-pay

## 2014-04-09 ENCOUNTER — Emergency Department (HOSPITAL_COMMUNITY)
Admission: EM | Admit: 2014-04-09 | Discharge: 2014-04-10 | Disposition: A | Discharge status: Home / Self Care | Payer: Self-pay | Attending: Emergency Medicine | Admitting: Emergency Medicine

## 2014-04-09 ENCOUNTER — Encounter (HOSPITAL_COMMUNITY): Payer: Self-pay | Admitting: Emergency Medicine

## 2014-04-09 DIAGNOSIS — Z880 Allergy status to penicillin: Secondary | ICD-10-CM | POA: Insufficient documentation

## 2014-04-09 DIAGNOSIS — I10 Essential (primary) hypertension: Secondary | ICD-10-CM | POA: Insufficient documentation

## 2014-04-09 DIAGNOSIS — F17200 Nicotine dependence, unspecified, uncomplicated: Secondary | ICD-10-CM | POA: Insufficient documentation

## 2014-04-09 DIAGNOSIS — Z8719 Personal history of other diseases of the digestive system: Secondary | ICD-10-CM | POA: Insufficient documentation

## 2014-04-09 DIAGNOSIS — R109 Unspecified abdominal pain: Secondary | ICD-10-CM | POA: Insufficient documentation

## 2014-04-09 DIAGNOSIS — Z791 Long term (current) use of non-steroidal anti-inflammatories (NSAID): Secondary | ICD-10-CM | POA: Insufficient documentation

## 2014-04-09 DIAGNOSIS — M1990 Unspecified osteoarthritis, unspecified site: Secondary | ICD-10-CM

## 2014-04-09 DIAGNOSIS — Z79899 Other long term (current) drug therapy: Secondary | ICD-10-CM | POA: Insufficient documentation

## 2014-04-09 DIAGNOSIS — F319 Bipolar disorder, unspecified: Secondary | ICD-10-CM | POA: Insufficient documentation

## 2014-04-09 DIAGNOSIS — M1610 Unilateral primary osteoarthritis, unspecified hip: Secondary | ICD-10-CM | POA: Insufficient documentation

## 2014-04-09 LAB — COMPREHENSIVE METABOLIC PANEL
ALBUMIN: 3.9 g/dL (ref 3.5–5.2)
ALT: 83 U/L — ABNORMAL HIGH (ref 0–35)
AST: 220 U/L — AB (ref 0–37)
Alkaline Phosphatase: 120 U/L — ABNORMAL HIGH (ref 39–117)
BILIRUBIN TOTAL: 0.6 mg/dL (ref 0.3–1.2)
BUN: 3 mg/dL — ABNORMAL LOW (ref 6–23)
CHLORIDE: 98 meq/L (ref 96–112)
CO2: 20 mEq/L (ref 19–32)
CREATININE: 0.37 mg/dL — AB (ref 0.50–1.10)
Calcium: 8.9 mg/dL (ref 8.4–10.5)
GFR calc Af Amer: >90 mL/min (ref >90)
GFR calc non Af Amer: >90 mL/min (ref >90)
Glucose, Bld: 84 mg/dL (ref 70–99)
Potassium: 4 mEq/L (ref 3.7–5.3)
Sodium: 137 mEq/L (ref 137–147)
Total Protein: 7.5 g/dL (ref 6.0–8.3)

## 2014-04-09 LAB — URINALYSIS, ROUTINE W REFLEX MICROSCOPIC
Bilirubin Urine: NEGATIVE
GLUCOSE, UA: NEGATIVE mg/dL
Hgb urine dipstick: NEGATIVE
KETONES UR: NEGATIVE mg/dL
LEUKOCYTES UA: NEGATIVE
Nitrite: NEGATIVE
Protein, ur: NEGATIVE mg/dL
Specific Gravity, Urine: 1.008 (ref 1.005–1.030)
Urobilinogen, UA: 1 mg/dL (ref 0.0–1.0)
pH: 6.5 (ref 5.0–8.0)

## 2014-04-09 LAB — CBC WITH DIFFERENTIAL/PLATELET
BASOS PCT: 1 % (ref 0–1)
Basophils Absolute: 0 10*3/uL (ref 0.0–0.1)
Eosinophils Absolute: 0 10*3/uL (ref 0.0–0.7)
Eosinophils Relative: 1 % (ref 0–5)
HEMATOCRIT: 33.4 % — AB (ref 36.0–46.0)
Hemoglobin: 11.3 g/dL — ABNORMAL LOW (ref 12.0–15.0)
Lymphocytes Relative: 44 % (ref 12–46)
Lymphs Abs: 1.9 10*3/uL (ref 0.7–4.0)
MCH: 29.2 pg (ref 26.0–34.0)
MCHC: 33.8 g/dL (ref 30.0–36.0)
MCV: 86.3 fL (ref 78.0–100.0)
MONO ABS: 0.6 10*3/uL (ref 0.1–1.0)
Monocytes Relative: 14 % — ABNORMAL HIGH (ref 3–12)
NEUTROS ABS: 1.7 10*3/uL (ref 1.7–7.7)
NEUTROS PCT: 40 % — AB (ref 43–77)
PLATELETS: 127 10*3/uL — AB (ref 150–400)
RBC: 3.87 MIL/uL (ref 3.87–5.11)
RDW: 18 % — AB (ref 11.5–15.5)
WBC: 4.4 10*3/uL (ref 4.0–10.5)

## 2014-04-09 LAB — LIPASE, BLOOD: Lipase: 41 U/L (ref 11–59)

## 2014-04-09 MED ORDER — OXYCODONE-ACETAMINOPHEN 5-325 MG PO TABS
1.0000 | ORAL_TABLET | Freq: Once | ORAL | Status: AC
  Administered 2014-04-09: 1 via ORAL
  Filled 2014-04-09: qty 1

## 2014-04-09 MED ORDER — MELOXICAM 7.5 MG PO TABS: 7.5000 mg | ORAL_TABLET | Freq: Every day | ORAL | Status: DC

## 2014-04-09 MED ORDER — IBUPROFEN 800 MG PO TABS
800.0000 mg | ORAL_TABLET | Freq: Once | ORAL | Status: AC
  Administered 2014-04-09: 800 mg via ORAL
  Filled 2014-04-09: qty 1

## 2014-04-09 MED ORDER — GABAPENTIN 300 MG PO CAPS: 300.0000 mg | ORAL_CAPSULE | Freq: Three times a day (TID) | ORAL | Status: DC

## 2014-04-09 NOTE — ED Provider Notes (Signed)
CSN: [id number]     Arrival date & time 04/09/14  1923 History   First MD Initiated Contact with Patient 04/09/14 2031     Chief Complaint  Patient presents with  . Abdominal Pain     ) HPI  Complains of pain in both range her hips when she walks this is been present for years. States it worsened last 5 or 6 weeks. States he felt nauseated this morning. No vomiting no diarrhea no chills no specific abdominal complaints.  Past Medical History  Diagnosis Date  . Hypertension   . Alcohol abuse   . Cirrhosis of liver   . Bipolar 1 disorder   . Depression    Past Surgical History  Procedure Laterality Date  . Mandible fracture surgery     History reviewed. No pertinent family history. History  Substance Use Topics  . Smoking status: Current Every Day Smoker -- 1.00 packs/day for 10 years    Types: Cigarettes  . Smokeless tobacco: Not on file  . Alcohol Use: Yes     Comment: pt smells of ETOH at this time   OB History   Grav Para Term Preterm Abortions TAB SAB Ect Mult Living                 Review of Systems  Constitutional: Negative for fever, chills, diaphoresis, appetite change and fatigue.  HENT: Negative for mouth sores, sore throat and trouble swallowing.   Eyes: Negative for visual disturbance.  Respiratory: Negative for cough, chest tightness, shortness of breath and wheezing.   Cardiovascular: Negative for chest pain.  Gastrointestinal: Negative for nausea, vomiting, abdominal pain, diarrhea and abdominal distention.  Endocrine: Negative for polydipsia, polyphagia and polyuria.  Genitourinary: Negative for dysuria, frequency and hematuria.  Musculoskeletal: Negative for gait problem.       To address back of both her hips as her area of pain. Describes occasional "clicking".  Skin: Negative for color change, pallor and rash.  Neurological: Negative for dizziness, syncope, light-headedness and headaches.  Hematological: Does not bruise/bleed easily.   Psychiatric/Behavioral: Negative for behavioral problems and confusion.      Allergies  Penicillins  Home Medications   Prior to Admission medications   Medication Sig Start Date End Date Taking? Authorizing Provider  albuterol (PROVENTIL HFA;VENTOLIN HFA) 108 (90 BASE) MCG/ACT inhaler Inhale 2 puffs into the lungs every 6 (six) hours as needed for wheezing or shortness of breath.   Yes Historical Provider, MD  naproxen sodium (ANAPROX) 220 MG tablet Take 440 mg by mouth 2 (two) times daily with a meal.   Yes Historical Provider, MD  gabapentin (NEURONTIN) 300 MG capsule Take 1 capsule (300 mg total) by mouth 3 (three) times daily. 04/09/14   Tanna Furry, MD  meloxicam (MOBIC) 7.5 MG tablet Take 1 tablet (7.5 mg total) by mouth daily. 04/09/14   Tanna Furry, MD   BP 105/59  Pulse 92  Temp(Src) 98.5 F (36.9 C) (Oral)  Resp 20  SpO2 93% Physical Exam  Constitutional: She is oriented to person, place, and time. She appears well-developed and well-nourished. No distress.  HENT:  Head: Normocephalic.  Eyes: Conjunctivae are normal. Pupils are equal, round, and reactive to light. No scleral icterus.  Neck: Normal range of motion. Neck supple. No thyromegaly present.  Cardiovascular: Normal rate and regular rhythm.  Exam reveals no gallop and no friction rub.   No murmur heard. Pulmonary/Chest: Effort normal and breath sounds normal. No respiratory distress. She has no wheezes. She  has no rales.  Abdominal: Soft. Bowel sounds are normal. She exhibits no distension. There is no tenderness. There is no rebound.  Musculoskeletal: Normal range of motion.  Has pain with range of motion of bilateral extremities at the hip.  Neurological: She is alert and oriented to person, place, and time.  Skin: Skin is warm and dry. No rash noted.  Psychiatric: She has a normal mood and affect. Her behavior is normal.    ED Course  Procedures (including critical care time) Labs Review Labs Reviewed   CBC WITH DIFFERENTIAL - Abnormal; Notable for the following:    Hemoglobin 11.3 (*)    HCT 33.4 (*)    RDW 18.0 (*)    Platelets 127 (*)    Neutrophils Relative % 40 (*)    Monocytes Relative 14 (*)    All other components within normal limits  COMPREHENSIVE METABOLIC PANEL - Abnormal; Notable for the following:    BUN 3 (*)    Creatinine, Ser 0.37 (*)    AST 220 (*)    ALT 83 (*)    Alkaline Phosphatase 120 (*)    All other components within normal limits  LIPASE, BLOOD  URINALYSIS, ROUTINE W REFLEX MICROSCOPIC    Imaging Review Dg Pelvis 1-2 Views  04/09/2014   CLINICAL DATA:  Bilateral groin pain for 2 months.  EXAM: PELVIS - 1-2 VIEW  COMPARISON:  Abdomen series from 09/03/2009.  FINDINGS: Severe degenerative joint disease both hips, with flattening and fragmentation of the femoral heads, possible avascular necrosis. No acute pelvic fracture or dislocation. Pelvic phleboliths. Vascular calcification.  IMPRESSION: Severe bilateral hip joint degenerative change, possible avascular necrosis. Marked progression from 2010.   Electronically Signed   By: Rolla Flatten M.D.   On: 04/09/2014 23:16     EKG Interpretation None      MDM   Final diagnoses:  Arthritis    Significant degenerative arthritis. Plan NSADIS, Neurontin.  F/u.    Tanna Furry, MD 04/10/14 0001

## 2014-04-09 NOTE — Discharge Instructions (Signed)
Arthritis, Nonspecific Arthritis is inflammation of a joint. This usually means pain, redness, warmth or swelling are present. One or more joints may be involved. There are a number of types of arthritis. Your caregiver may not be able to tell what type of arthritis you have right away. CAUSES  The most common cause of arthritis is the wear and tear on the joint (osteoarthritis). This causes damage to the cartilage, which can break down over time. The knees, hips, back and neck are most often affected by this type of arthritis. Other types of arthritis and common causes of joint pain include:  Sprains and other injuries near the joint. Sometimes minor sprains and injuries cause pain and swelling that develop hours later.  Rheumatoid arthritis. This affects hands, feet and knees. It usually affects both sides of your body at the same time. It is often associated with chronic ailments, fever, weight loss and general weakness.  Crystal arthritis. Gout and pseudo gout can cause occasional acute severe pain, redness and swelling in the foot, ankle, or knee.  Infectious arthritis. Bacteria can get into a joint through a break in overlying skin. This can cause infection of the joint. Bacteria and viruses can also spread through the blood and affect your joints.  Drug, infectious and allergy reactions. Sometimes joints can become mildly painful and slightly swollen with these types of illnesses. SYMPTOMS   Pain is the main symptom.  Your joint or joints can also be red, swollen and warm or hot to the touch.  You may have a fever with certain types of arthritis, or even feel overall ill.  The joint with arthritis will hurt with movement. Stiffness is present with some types of arthritis. DIAGNOSIS  Your caregiver will suspect arthritis based on your description of your symptoms and on your exam. Testing may be needed to find the type of arthritis:  Blood and sometimes urine tests.  X-ray tests  and sometimes CT or MRI scans.  Removal of fluid from the joint (arthrocentesis) is done to check for bacteria, crystals or other causes. Your caregiver (or a specialist) will numb the area over the joint with a local anesthetic, and use a needle to remove joint fluid for examination. This procedure is only minimally uncomfortable.  Even with these tests, your caregiver may not be able to tell what kind of arthritis you have. Consultation with a specialist (rheumatologist) may be helpful. TREATMENT  Your caregiver will discuss with you treatment specific to your type of arthritis. If the specific type cannot be determined, then the following general recommendations may apply. Treatment of severe joint pain includes:  Rest.  Elevation.  Anti-inflammatory medication (for example, ibuprofen) may be prescribed. Avoiding activities that cause increased pain.  Only take over-the-counter or prescription medicines for pain and discomfort as recommended by your caregiver.  Cold packs over an inflamed joint may be used for 10 to 15 minutes every hour. Hot packs sometimes feel better, but do not use overnight. Do not use hot packs if you are diabetic without your caregiver's permission.  A cortisone shot into arthritic joints may help reduce pain and swelling.  Any acute arthritis that gets worse over the next 1 to 2 days needs to be looked at to be sure there is no joint infection. Long-term arthritis treatment involves modifying activities and lifestyle to reduce joint stress jarring. This can include weight loss. Also, exercise is needed to nourish the joint cartilage and remove waste. This helps keep the muscles   around the joint strong. HOME CARE INSTRUCTIONS   Do not take aspirin to relieve pain if gout is suspected. This elevates uric acid levels.  Only take over-the-counter or prescription medicines for pain, discomfort or fever as directed by your caregiver.  Rest the joint as much as  possible.  If your joint is swollen, keep it elevated.  Use crutches if the painful joint is in your leg.  Drinking plenty of fluids may help for certain types of arthritis.  Follow your caregiver's dietary instructions.  Try low-impact exercise such as:  Swimming.  Water aerobics.  Biking.  Walking.  Morning stiffness is often relieved by a warm shower.  Put your joints through regular range-of-motion. SEEK MEDICAL CARE IF:   You do not feel better in 24 hours or are getting worse.  You have side effects to medications, or are not getting better with treatment. SEEK IMMEDIATE MEDICAL CARE IF:   You have a fever.  You develop severe joint pain, swelling or redness.  Many joints are involved and become painful and swollen.  There is severe back pain and/or leg weakness.  You have loss of bowel or bladder control. Document Released: 11/27/2004 Document Revised: 01/12/2012 Document Reviewed: 12/13/2008 Exit[employer] Patient Information 2014 [employer].  

## 2014-04-09 NOTE — ED Notes (Signed)
To locate pt. at triage and waiting area several times.

## 2014-04-09 NOTE — ED Notes (Signed)
Pt. reports intermittent low abdominal pain for 2 months worse today with nausea/vomitting . Denies diarrhea / no fever or chills.

## 2014-05-18 ENCOUNTER — Emergency Department (HOSPITAL_COMMUNITY)
Admission: EM | Admit: 2014-05-18 | Discharge: 2014-05-18 | Discharge status: Against Medical Advice | Payer: Self-pay | Attending: Emergency Medicine | Admitting: Emergency Medicine

## 2014-05-18 ENCOUNTER — Encounter (HOSPITAL_COMMUNITY): Payer: Self-pay | Admitting: Emergency Medicine

## 2014-05-18 DIAGNOSIS — F319 Bipolar disorder, unspecified: Secondary | ICD-10-CM | POA: Insufficient documentation

## 2014-05-18 DIAGNOSIS — F17200 Nicotine dependence, unspecified, uncomplicated: Secondary | ICD-10-CM | POA: Insufficient documentation

## 2014-05-18 DIAGNOSIS — Z8719 Personal history of other diseases of the digestive system: Secondary | ICD-10-CM | POA: Insufficient documentation

## 2014-05-18 DIAGNOSIS — Z791 Long term (current) use of non-steroidal anti-inflammatories (NSAID): Secondary | ICD-10-CM | POA: Insufficient documentation

## 2014-05-18 DIAGNOSIS — F1010 Alcohol abuse, uncomplicated: Secondary | ICD-10-CM | POA: Insufficient documentation

## 2014-05-18 DIAGNOSIS — R110 Nausea: Secondary | ICD-10-CM | POA: Insufficient documentation

## 2014-05-18 DIAGNOSIS — Z79899 Other long term (current) drug therapy: Secondary | ICD-10-CM | POA: Insufficient documentation

## 2014-05-18 DIAGNOSIS — N949 Unspecified condition associated with female genital organs and menstrual cycle: Secondary | ICD-10-CM | POA: Insufficient documentation

## 2014-05-18 DIAGNOSIS — I10 Essential (primary) hypertension: Secondary | ICD-10-CM | POA: Insufficient documentation

## 2014-05-18 LAB — COMPREHENSIVE METABOLIC PANEL
ALK PHOS: 134 U/L — AB (ref 39–117)
ALT: 77 U/L — AB (ref 0–35)
AST: 260 U/L — AB (ref 0–37)
Albumin: 4.2 g/dL (ref 3.5–5.2)
Anion gap: 22 — ABNORMAL HIGH (ref 5–15)
BILIRUBIN TOTAL: 0.6 mg/dL (ref 0.3–1.2)
BUN: 3 mg/dL — ABNORMAL LOW (ref 6–23)
CHLORIDE: 90 meq/L — AB (ref 96–112)
CO2: 21 meq/L (ref 19–32)
Calcium: 8.7 mg/dL (ref 8.4–10.5)
Creatinine, Ser: 0.37 mg/dL — ABNORMAL LOW (ref 0.50–1.10)
GFR calc Af Amer: >90 mL/min (ref >90)
GFR calc non Af Amer: >90 mL/min (ref >90)
Glucose, Bld: 91 mg/dL (ref 70–99)
POTASSIUM: 4 meq/L (ref 3.7–5.3)
SODIUM: 133 meq/L — AB (ref 137–147)
Total Protein: 8.1 g/dL (ref 6.0–8.3)

## 2014-05-18 LAB — CBC WITH DIFFERENTIAL/PLATELET
BASOS ABS: 0 10*3/uL (ref 0.0–0.1)
Basophils Relative: 1 % (ref 0–1)
EOS ABS: 0.1 10*3/uL (ref 0.0–0.7)
EOS PCT: 1 % (ref 0–5)
HCT: 36.4 % (ref 36.0–46.0)
Hemoglobin: 12.4 g/dL (ref 12.0–15.0)
LYMPHS ABS: 2.5 10*3/uL (ref 0.7–4.0)
Lymphocytes Relative: 49 % — ABNORMAL HIGH (ref 12–46)
MCH: 29.6 pg (ref 26.0–34.0)
MCHC: 34.1 g/dL (ref 30.0–36.0)
MCV: 86.9 fL (ref 78.0–100.0)
MONO ABS: 0.3 10*3/uL (ref 0.1–1.0)
Monocytes Relative: 7 % (ref 3–12)
Neutro Abs: 2.2 10*3/uL (ref 1.7–7.7)
Neutrophils Relative %: 43 % (ref 43–77)
Platelets: 110 10*3/uL — ABNORMAL LOW (ref 150–400)
RBC: 4.19 MIL/uL (ref 3.87–5.11)
RDW: 17.7 % — AB (ref 11.5–15.5)
WBC: 5.1 10*3/uL (ref 4.0–10.5)

## 2014-05-18 LAB — LIPASE, BLOOD: LIPASE: 54 U/L (ref 11–59)

## 2014-05-18 NOTE — ED Notes (Signed)
Pt states that she has to leave
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
## 2014-09-29 ENCOUNTER — Encounter (HOSPITAL_COMMUNITY): Payer: Self-pay | Admitting: *Deleted

## 2014-09-29 ENCOUNTER — Emergency Department (HOSPITAL_COMMUNITY)
Admission: EM | Admit: 2014-09-29 | Discharge: 2014-09-29 | Discharge status: Against Medical Advice | Payer: Self-pay | Attending: Emergency Medicine | Admitting: Emergency Medicine

## 2014-09-29 DIAGNOSIS — M1990 Unspecified osteoarthritis, unspecified site: Secondary | ICD-10-CM | POA: Insufficient documentation

## 2014-09-29 DIAGNOSIS — I10 Essential (primary) hypertension: Secondary | ICD-10-CM | POA: Insufficient documentation

## 2014-09-29 DIAGNOSIS — Z720 Tobacco use: Secondary | ICD-10-CM | POA: Insufficient documentation

## 2014-09-29 NOTE — ED Notes (Signed)
Pt left after triage. Patient was never physically in FT room 10.

## 2014-09-29 NOTE — ED Notes (Signed)
Pt in stating she is having trouble with her arthritis pain, points to groin area when describing pain and says its from her hips, pt states she is unable to afford her prescriptions for pain

## 2015-05-25 ENCOUNTER — Emergency Department (INDEPENDENT_AMBULATORY_CARE_PROVIDER_SITE_OTHER): Payer: Self-pay

## 2015-05-25 ENCOUNTER — Encounter (HOSPITAL_COMMUNITY): Payer: Self-pay | Admitting: Emergency Medicine

## 2015-05-25 ENCOUNTER — Emergency Department (INDEPENDENT_AMBULATORY_CARE_PROVIDER_SITE_OTHER)
Admission: EM | Admit: 2015-05-25 | Discharge: 2015-05-25 | Disposition: A | Discharge status: Home / Self Care | Payer: Self-pay | Source: Home / Self Care | Attending: Family Medicine | Admitting: Family Medicine

## 2015-05-25 DIAGNOSIS — M7021 Olecranon bursitis, right elbow: Secondary | ICD-10-CM

## 2015-05-25 DIAGNOSIS — Z23 Encounter for immunization: Secondary | ICD-10-CM

## 2015-05-25 HISTORY — DX: Unspecified osteoarthritis, unspecified site: M19.90

## 2015-05-25 MED ORDER — TETANUS-DIPHTH-ACELL PERTUSSIS 5-2.5-18.5 LF-MCG/0.5 IM SUSP
0.5000 mL | Freq: Once | INTRAMUSCULAR | Status: AC
  Administered 2015-05-25: 0.5 mL via INTRAMUSCULAR

## 2015-05-25 MED ORDER — METHYLPREDNISOLONE ACETATE 40 MG/ML IJ SUSP
INTRAMUSCULAR | Status: AC
  Filled 2015-05-25: qty 1

## 2015-05-25 NOTE — ED Provider Notes (Addendum)
CSN: [id number]     Arrival date & time 05/25/15  1303 History   First MD Initiated Contact with Patient 05/25/15 1323     Chief Complaint  Patient presents with  . Elbow Injury   (Consider location/radiation/quality/duration/timing/severity/associated sxs/prior Treatment) HPI  Fell while ambulating 2 weeks ago. Hit an electrical box on the wall. Immediately started to swell. Very painful.  In redness over the elbow has been constant and unchanged since onset. Pain is worse with movement and palpation. Denies purulent discharge, fevers, nausea, vomiting, chest pain, shortness breath, palpitations, rash. Aleve w/o improvement. No previous history of trauma to that elbow.   Past Medical History  Diagnosis Date  . Hypertension   . Alcohol abuse   . Cirrhosis of liver   . Bipolar 1 disorder   . Depression   . Arthritis    Past Surgical History  Procedure Laterality Date  . Mandible fracture surgery     No family history on file. History  Substance Use Topics  . Smoking status: Current Every Day Smoker -- 1.00 packs/day for 10 years    Types: Cigarettes  . Smokeless tobacco: Not on file  . Alcohol Use: Yes     Comment: pt smells of ETOH at this time 05/18/14   OB History    No data available     Review of Systems Per HPI with all other pertinent systems negative.   Allergies  Penicillins  Home Medications   Prior to Admission medications   Medication Sig Start Date End Date Taking? Authorizing Provider  albuterol (PROVENTIL HFA;VENTOLIN HFA) 108 (90 BASE) MCG/ACT inhaler Inhale 2 puffs into the lungs every 6 (six) hours as needed for wheezing or shortness of breath.    Historical Provider, MD  gabapentin (NEURONTIN) 300 MG capsule Take 1 capsule (300 mg total) by mouth 3 (three) times daily. 04/09/14   Tanna Furry, MD  meloxicam (MOBIC) 7.5 MG tablet Take 1 tablet (7.5 mg total) by mouth daily. 04/09/14   Tanna Furry, MD  naproxen sodium (ANAPROX) 220 MG tablet Take 440 mg by  mouth 2 (two) times daily with a meal.    Historical Provider, MD   BP 154/88 mmHg  Pulse 75  Temp(Src) 97.2 F (36.2 C) (Oral)  Resp 18  SpO2 95% Physical Exam Physical Exam  Constitutional: oriented to person, place, and time. appears well-developed and well-nourished. No distress.  HENT:  Head: Normocephalic and atraumatic.  Eyes: EOMI. PERRL.  Neck: Normal range of motion.  Cardiovascular: RRR, no m/r/g, 2+ distal pulses,  Pulmonary/Chest: Effort normal and breath sounds normal. No respiratory distress.  Abdominal: Soft. Bowel sounds are normal. NonTTP, no distension.  Musculoskeletal: Right elbow with surrounding erythema and induration around the elbow. Small possible fluid collection around the olecranon process.  Neurological: alert and oriented to person, place, and time.  Skin: Skin is warm. No rash noted. non diaphoretic.  Psychiatric: Odd affect  ED Course  Injection of joint Date/Time: 05/25/2015 2:49 PM Performed by: Marily Memos, Anjolaoluwa Siguenza J Authorized by: Marily Memos, Naasir Carreira J Consent: Verbal consent obtained. Risks and benefits: risks, benefits and alternatives were discussed Consent given by: patient Patient identity confirmed: verbally with patient Preparation: Patient was prepped and draped in the usual sterile fashion. Local anesthesia used: Ethel chloride spray. Patient sedated: no Patient tolerance: Patient tolerated the procedure well with no immediate complications Comments: After proper anesthesia was obtained an 18-gauge was used to access the lacrimal bursa. Gentle pressure was applied using a 10 mL syringe  and there is no evidence of purulent, hemorrhagic, or serosanguineous fluid collection. At this time it was felt that infectious etiology was not a cause of patient's pain and 40 mg of Depo-Medrol and 2 mL of lidocaine were injected into the bursa. Anabolic ointment and a sterile bandage were applied.   (including critical care time) Labs Review Labs Reviewed -  No data to display  Imaging Review Dg Elbow Complete Right  05/25/2015   CLINICAL DATA:  Status post fall 3 weeks ago with a blow to the right elbow. Posterior right elbow swelling.  EXAM: RIGHT ELBOW - COMPLETE 3+ VIEW  COMPARISON:  None.  FINDINGS: There is no fracture dislocation. No joint effusion is identified. Soft tissue swelling is seen over the olecranon process consistent with bursitis. Small calcifications at the olecranon are most consistent with tendinopathy of the triceps.  IMPRESSION: Findings consistent with olecranon bursitis. This could be hemorrhagic given history of trauma but can also be seen in other processes such as gout.  Negative for fracture.   Electronically Signed   By: Inge Rise M.D.   On: 05/25/2015 14:01     MDM   1. Olecranon bursitis, right    Plain film without evidence of fracture and likely bursitis. This is consistent with  Tetanus booster gi patient's clinical presentation. No evidence of septic arthritis or overlying cellulitis. Joint injection as above. Continue NSAIDs, ice, range of motion exercises.    tetanus booster given IM.  Waldemar Dickens, MD 05/25/15 1452  Waldemar Dickens, MD 05/25/15 [phone]

## 2015-05-25 NOTE — Discharge Instructions (Signed)
The fluid-filled sac of your elbow was inflamed. This condition is called Lacon bursitis. The area was injected with lidocaine and a steroid to help control your symptoms. Please remember to  avoid trauma to the area and perform range of motion exercises twice daily. Use ice and Aleve as needed for additional relief.

## 2015-05-25 NOTE — ED Notes (Signed)
Pt reports she fell 2 weeks ago and hit her right elbow Sx include swelling, pain that is not getting any better Alert, no signs of acute distress.

## 2016-05-25 ENCOUNTER — Encounter (HOSPITAL_COMMUNITY): Payer: Self-pay | Admitting: Emergency Medicine

## 2016-05-25 ENCOUNTER — Emergency Department (HOSPITAL_COMMUNITY)
Admission: EM | Admit: 2016-05-25 | Discharge: 2016-05-26 | Disposition: A | Discharge status: Home / Self Care | Payer: Self-pay | Attending: Emergency Medicine | Admitting: Emergency Medicine

## 2016-05-25 DIAGNOSIS — F17210 Nicotine dependence, cigarettes, uncomplicated: Secondary | ICD-10-CM | POA: Insufficient documentation

## 2016-05-25 DIAGNOSIS — Y999 Unspecified external cause status: Secondary | ICD-10-CM | POA: Insufficient documentation

## 2016-05-25 DIAGNOSIS — Y929 Unspecified place or not applicable: Secondary | ICD-10-CM | POA: Insufficient documentation

## 2016-05-25 DIAGNOSIS — F10220 Alcohol dependence with intoxication, uncomplicated: Secondary | ICD-10-CM | POA: Insufficient documentation

## 2016-05-25 DIAGNOSIS — F10920 Alcohol use, unspecified with intoxication, uncomplicated: Secondary | ICD-10-CM

## 2016-05-25 DIAGNOSIS — S51811A Laceration without foreign body of right forearm, initial encounter: Secondary | ICD-10-CM | POA: Insufficient documentation

## 2016-05-25 DIAGNOSIS — I10 Essential (primary) hypertension: Secondary | ICD-10-CM | POA: Insufficient documentation

## 2016-05-25 DIAGNOSIS — W268XXA Contact with other sharp object(s), not elsewhere classified, initial encounter: Secondary | ICD-10-CM | POA: Insufficient documentation

## 2016-05-25 DIAGNOSIS — Y939 Activity, unspecified: Secondary | ICD-10-CM | POA: Insufficient documentation

## 2016-05-25 MED ORDER — LIDOCAINE-EPINEPHRINE (PF) 2 %-1:200000 IJ SOLN
20.0000 mL | Freq: Once | INTRAMUSCULAR | Status: AC
  Administered 2016-05-25: 20 mL
  Filled 2016-05-25: qty 20
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
## 2016-05-26 NOTE — ED Notes (Signed)
Pt ambulated multiple times to bathroom with assistance, NAD noticed.

## 2016-05-26 NOTE — Discharge Instructions (Signed)
Keep your wound clean and dry. Wash wound gently with mild soap and warm water, then reapply a new bandage after cleaning wound. Return if area begins to have redness around it, drains pus, if you develop fever, or have any additional concerns. I also would like you to consider cutting back on your alcohol consumption (or quitting entirely). I have provided some resources for you if this is something you are interested in doing.   Substance Abuse Treatment Programs  Intensive Outpatient Programs [facility]     [address], [place]       [facility] [place] [address] [place], [place]  [facility]     (Inpatient and outpatient)     [address]           [phone]    [facility] [address] (Outpatient/Inpatient, [place])    (insurance only) [phone]             [facility] ([place]) [place], [place]     [facility]     [address]     [place], [place]       [facility] (for caregivers and family) [phone] Kristeen [place]. [place], [place]      Residential Treatment Programs [facility]      [address] [place], [place] [address]  [phone]       [facility] [place], [place] [address] [phone]  [facility]        [phone]       [facility] [place] [phone]  [facility] ([place]             [place], [place] or [place] of [place], [address] [phone]  [facility] [place]    [address]      [place], [place]       [facility] [address] [place],  [place]  [place]   [address] [place], [place] [address]     [phone]      Admissions: 8am-3pm M-F  [facility] ([facility]) [address] [place], [place]  [facility] Program: Residential Program [phone] Days)   [place], [place] or [phone]     [facility]: [facility] [place], [place] (Walk in Hours over the weekend or by referral)  [facility] [place], [place],  [place] [address] [phone]  Crisis Mobile: [facility]:  [phone] (for crisis response 24 hours a day) [facility] Hotline:      [phone] Outpatient Psychiatry and Counseling  [facility]: Mobile Crisis Management 24 hours:  [phone]  [facility] of the [employer] sliding scale fee and walk in schedule: M-F 8am-12pm/1pm-3pm [place], [place] [address] [place], [place] [address] [phone]  [facility] ([facility] [employer])- new patient walk-in appointments available Monday - Friday 8am -3pm.          [address] [place], [place] [address] [phone] or crisis line- [facility]/ Intensive Outpatient Therapy Program [place], [place] [address] [place]      [phone] [place], [place] [address]                 [place]   [facility] [phone]. [address] [place], [place] [address]   [employer] [facility]          [address] Johnette Abraham  [place], [place] [address]       [phone]  [facility] [address] [place], [place] [address] [phone]  [facility]    [address] [place], [place] [address]     [place], [place] Joycelyn Man     [place] [address]     [phone]       [facility] [place] [address]  [facility]     [address], [place], MD [address] [place], [place] [address] [place]     [address]     [place], [place] [address]     [phone]       [facility] [address] [place], [place] [address] [phone] Resources for Temporary Residential Assistance/Crisis Centers

## 2017-01-16 ENCOUNTER — Inpatient Hospital Stay (HOSPITAL_COMMUNITY)
Admission: EM | Admit: 2017-01-16 | Discharge: 2017-01-23 | DRG: 375 | Disposition: A | Discharge status: Home / Self Care | Payer: Medicaid Other | Attending: Family Medicine | Admitting: Family Medicine

## 2017-01-16 ENCOUNTER — Encounter (HOSPITAL_COMMUNITY): Payer: Self-pay

## 2017-01-16 ENCOUNTER — Emergency Department (HOSPITAL_COMMUNITY): Payer: Medicaid Other

## 2017-01-16 DIAGNOSIS — C20 Malignant neoplasm of rectum: Principal | ICD-10-CM | POA: Diagnosis present

## 2017-01-16 DIAGNOSIS — I10 Essential (primary) hypertension: Secondary | ICD-10-CM | POA: Diagnosis present

## 2017-01-16 DIAGNOSIS — K5730 Diverticulosis of large intestine without perforation or abscess without bleeding: Secondary | ICD-10-CM | POA: Diagnosis present

## 2017-01-16 DIAGNOSIS — F419 Anxiety disorder, unspecified: Secondary | ICD-10-CM | POA: Diagnosis present

## 2017-01-16 DIAGNOSIS — D509 Iron deficiency anemia, unspecified: Secondary | ICD-10-CM

## 2017-01-16 DIAGNOSIS — F17210 Nicotine dependence, cigarettes, uncomplicated: Secondary | ICD-10-CM | POA: Diagnosis present

## 2017-01-16 DIAGNOSIS — F10229 Alcohol dependence with intoxication, unspecified: Secondary | ICD-10-CM | POA: Diagnosis present

## 2017-01-16 DIAGNOSIS — Y907 Blood alcohol level of 200-239 mg/100 ml: Secondary | ICD-10-CM | POA: Diagnosis present

## 2017-01-16 DIAGNOSIS — F319 Bipolar disorder, unspecified: Secondary | ICD-10-CM

## 2017-01-16 DIAGNOSIS — Z720 Tobacco use: Secondary | ICD-10-CM | POA: Insufficient documentation

## 2017-01-16 DIAGNOSIS — K5900 Constipation, unspecified: Secondary | ICD-10-CM | POA: Diagnosis present

## 2017-01-16 DIAGNOSIS — Z681 Body mass index (BMI) 19 or less, adult: Secondary | ICD-10-CM

## 2017-01-16 DIAGNOSIS — F1010 Alcohol abuse, uncomplicated: Secondary | ICD-10-CM | POA: Insufficient documentation

## 2017-01-16 DIAGNOSIS — E876 Hypokalemia: Secondary | ICD-10-CM | POA: Diagnosis not present

## 2017-01-16 DIAGNOSIS — K6289 Other specified diseases of anus and rectum: Secondary | ICD-10-CM

## 2017-01-16 DIAGNOSIS — E162 Hypoglycemia, unspecified: Secondary | ICD-10-CM | POA: Diagnosis present

## 2017-01-16 DIAGNOSIS — M160 Bilateral primary osteoarthritis of hip: Secondary | ICD-10-CM | POA: Diagnosis present

## 2017-01-16 DIAGNOSIS — Z23 Encounter for immunization: Secondary | ICD-10-CM

## 2017-01-16 DIAGNOSIS — E46 Unspecified protein-calorie malnutrition: Secondary | ICD-10-CM | POA: Diagnosis present

## 2017-01-16 DIAGNOSIS — R509 Fever, unspecified: Secondary | ICD-10-CM

## 2017-01-16 DIAGNOSIS — R109 Unspecified abdominal pain: Secondary | ICD-10-CM | POA: Insufficient documentation

## 2017-01-16 DIAGNOSIS — R1013 Epigastric pain: Secondary | ICD-10-CM

## 2017-01-16 DIAGNOSIS — D62 Acute posthemorrhagic anemia: Secondary | ICD-10-CM | POA: Diagnosis present

## 2017-01-16 DIAGNOSIS — D649 Anemia, unspecified: Secondary | ICD-10-CM

## 2017-01-16 DIAGNOSIS — F1410 Cocaine abuse, uncomplicated: Secondary | ICD-10-CM | POA: Insufficient documentation

## 2017-01-16 DIAGNOSIS — R6510 Systemic inflammatory response syndrome (SIRS) of non-infectious origin without acute organ dysfunction: Secondary | ICD-10-CM | POA: Diagnosis present

## 2017-01-16 DIAGNOSIS — K644 Residual hemorrhoidal skin tags: Secondary | ICD-10-CM | POA: Diagnosis present

## 2017-01-16 DIAGNOSIS — D125 Benign neoplasm of sigmoid colon: Secondary | ICD-10-CM | POA: Diagnosis present

## 2017-01-16 DIAGNOSIS — Z808 Family history of malignant neoplasm of other organs or systems: Secondary | ICD-10-CM

## 2017-01-16 DIAGNOSIS — Z800 Family history of malignant neoplasm of digestive organs: Secondary | ICD-10-CM

## 2017-01-16 LAB — RAPID URINE DRUG SCREEN, HOSP PERFORMED
Amphetamines: NOT DETECTED
Barbiturates: NOT DETECTED
Benzodiazepines: NOT DETECTED
Cocaine: POSITIVE — AB
Opiates: NOT DETECTED
Tetrahydrocannabinol: NOT DETECTED

## 2017-01-16 LAB — HEPATIC FUNCTION PANEL
ALT: 11 U/L — ABNORMAL LOW (ref 14–54)
AST: 30 U/L (ref 15–41)
Albumin: 3.1 g/dL — ABNORMAL LOW (ref 3.5–5.0)
Alkaline Phosphatase: 63 U/L (ref 38–126)
Bilirubin, Direct: 0.1 mg/dL (ref 0.1–0.5)
Indirect Bilirubin: 0.3 mg/dL (ref 0.3–0.9)
Total Bilirubin: 0.4 mg/dL (ref 0.3–1.2)
Total Protein: 8.6 g/dL — ABNORMAL HIGH (ref 6.5–8.1)

## 2017-01-16 LAB — URINALYSIS, ROUTINE W REFLEX MICROSCOPIC
Bilirubin Urine: NEGATIVE
Glucose, UA: NEGATIVE mg/dL
Ketones, ur: NEGATIVE mg/dL
Leukocytes, UA: NEGATIVE
Nitrite: NEGATIVE
Protein, ur: NEGATIVE mg/dL
Specific Gravity, Urine: 1.004 — ABNORMAL LOW (ref 1.005–1.030)
pH: 5 (ref 5.0–8.0)

## 2017-01-16 LAB — CBC WITH DIFFERENTIAL/PLATELET
Basophils Absolute: 0 K/uL (ref 0.0–0.1)
Basophils Relative: 0 %
Eosinophils Absolute: 0.2 K/uL (ref 0.0–0.7)
Eosinophils Relative: 2 %
HCT: 26.5 % — ABNORMAL LOW (ref 36.0–46.0)
Hemoglobin: 7.5 g/dL — ABNORMAL LOW (ref 12.0–15.0)
Lymphocytes Relative: 28 %
Lymphs Abs: 2.5 K/uL (ref 0.7–4.0)
MCH: 17.4 pg — ABNORMAL LOW (ref 26.0–34.0)
MCHC: 28.3 g/dL — ABNORMAL LOW (ref 30.0–36.0)
MCV: 61.3 fL — ABNORMAL LOW (ref 78.0–100.0)
Monocytes Absolute: 0.6 K/uL (ref 0.1–1.0)
Monocytes Relative: 7 %
Neutro Abs: 5.7 K/uL (ref 1.7–7.7)
Neutrophils Relative %: 63 %
Platelets: 341 K/uL (ref 150–400)
RBC: 4.32 MIL/uL (ref 3.87–5.11)
RDW: 21.7 % — ABNORMAL HIGH (ref 11.5–15.5)
WBC: 9 K/uL (ref 4.0–10.5)

## 2017-01-16 LAB — BASIC METABOLIC PANEL WITH GFR
Anion gap: 13 (ref 5–15)
BUN: <5 mg/dL — ABNORMAL LOW (ref 6–20)
CO2: 20 mmol/L — ABNORMAL LOW (ref 22–32)
Calcium: 8.9 mg/dL (ref 8.9–10.3)
Chloride: 105 mmol/L (ref 101–111)
Creatinine, Ser: 0.43 mg/dL — ABNORMAL LOW (ref 0.44–1.00)
GFR calc Af Amer: >60 mL/min
GFR calc non Af Amer: >60 mL/min
Glucose, Bld: 64 mg/dL — ABNORMAL LOW (ref 65–99)
Potassium: 4 mmol/L (ref 3.5–5.1)
Sodium: 138 mmol/L (ref 135–145)

## 2017-01-16 LAB — RETICULOCYTES
RBC.: 3.91 MIL/uL (ref 3.87–5.11)
RETIC CT PCT: 1.7 % (ref 0.4–3.1)
Retic Count, Absolute: 66.5 10*3/uL (ref 19.0–186.0)

## 2017-01-16 LAB — VITAMIN B12: VITAMIN B 12: 226 pg/mL (ref 180–914)

## 2017-01-16 LAB — CBC
HEMATOCRIT: 23.6 % — AB (ref 36.0–46.0)
Hemoglobin: 6.8 g/dL — CL (ref 12.0–15.0)
MCH: 17.4 pg — AB (ref 26.0–34.0)
MCHC: 28.8 g/dL — ABNORMAL LOW (ref 30.0–36.0)
MCV: 60.4 fL — AB (ref 78.0–100.0)
Platelets: 304 10*3/uL (ref 150–400)
RBC: 3.91 MIL/uL (ref 3.87–5.11)
RDW: 21.4 % — AB (ref 11.5–15.5)
WBC: 9.8 10*3/uL (ref 4.0–10.5)

## 2017-01-16 LAB — ETHANOL: Alcohol, Ethyl (B): 235 mg/dL — ABNORMAL HIGH

## 2017-01-16 LAB — FERRITIN: FERRITIN: 5 ng/mL — AB (ref 11–307)

## 2017-01-16 LAB — IRON AND TIBC
Iron: 13 ug/dL — ABNORMAL LOW (ref 28–170)
SATURATION RATIOS: 3 % — AB (ref 10.4–31.8)
TIBC: 494 ug/dL — ABNORMAL HIGH (ref 250–450)
UIBC: 481 ug/dL

## 2017-01-16 LAB — PROTIME-INR
INR: 1.19
Prothrombin Time: 15.2 s (ref 11.4–15.2)

## 2017-01-16 LAB — LIPASE, BLOOD: Lipase: 16 U/L (ref 11–51)

## 2017-01-16 LAB — PREPARE RBC (CROSSMATCH)

## 2017-01-16 LAB — AMMONIA: Ammonia: 15 umol/L (ref 9–35)

## 2017-01-16 LAB — POC OCCULT BLOOD, ED: Fecal Occult Bld: NEGATIVE

## 2017-01-16 LAB — LACTATE DEHYDROGENASE: LDH: 156 U/L (ref 98–192)

## 2017-01-16 LAB — I-STAT BETA HCG BLOOD, ED (MC, WL, AP ONLY): I-stat hCG, quantitative: <5 m[IU]/mL

## 2017-01-16 LAB — CBG MONITORING, ED: Glucose-Capillary: 95 mg/dL (ref 65–99)

## 2017-01-16 LAB — TROPONIN I: Troponin I: <0.03 ng/mL (ref <0.03)

## 2017-01-16 MED ORDER — ADULT MULTIVITAMIN W/MINERALS CH
1.0000 | ORAL_TABLET | Freq: Every day | ORAL | Status: DC
  Administered 2017-01-17 – 2017-01-23 (×7): 1 via ORAL
  Filled 2017-01-16 (×7): qty 1

## 2017-01-16 MED ORDER — ALBUTEROL SULFATE (2.5 MG/3ML) 0.083% IN NEBU: 2.5000 mg | INHALATION_SOLUTION | Freq: Four times a day (QID) | RESPIRATORY_TRACT | Status: DC | PRN

## 2017-01-16 MED ORDER — GI COCKTAIL ~~LOC~~: 30.0000 mL | Freq: Three times a day (TID) | ORAL | Status: DC | PRN

## 2017-01-16 MED ORDER — VITAMIN B-1 100 MG PO TABS
100.0000 mg | ORAL_TABLET | Freq: Every day | ORAL | Status: DC
  Administered 2017-01-17 – 2017-01-23 (×7): 100 mg via ORAL
  Filled 2017-01-16 (×7): qty 1

## 2017-01-16 MED ORDER — PNEUMOCOCCAL VAC POLYVALENT 25 MCG/0.5ML IJ INJ
0.5000 mL | INJECTION | INTRAMUSCULAR | Status: AC
  Administered 2017-01-17: 0.5 mL via INTRAMUSCULAR
  Filled 2017-01-16: qty 0.5

## 2017-01-16 MED ORDER — PANTOPRAZOLE SODIUM 40 MG PO TBEC
40.0000 mg | DELAYED_RELEASE_TABLET | Freq: Every day | ORAL | Status: DC
Start: 2017-01-16 — End: 2017-01-23
  Administered 2017-01-17 – 2017-01-23 (×7): 40 mg via ORAL
  Filled 2017-01-16 (×8): qty 1

## 2017-01-16 MED ORDER — FOLIC ACID 1 MG PO TABS
1.0000 mg | ORAL_TABLET | Freq: Every day | ORAL | Status: DC
  Administered 2017-01-17 – 2017-01-23 (×7): 1 mg via ORAL
  Filled 2017-01-16 (×7): qty 1

## 2017-01-16 MED ORDER — ONDANSETRON HCL 4 MG/2ML IJ SOLN: 4.0000 mg | Freq: Four times a day (QID) | INTRAMUSCULAR | Status: DC | PRN

## 2017-01-16 MED ORDER — DEXTROSE-NACL 5-0.45 % IV SOLN
INTRAVENOUS | Status: DC
  Administered 2017-01-16: 21:00:00 via INTRAVENOUS

## 2017-01-16 MED ORDER — POLYETHYLENE GLYCOL 3350 17 G PO PACK
17.0000 g | PACK | Freq: Two times a day (BID) | ORAL | Status: DC
  Administered 2017-01-16 – 2017-01-20 (×8): 17 g via ORAL
  Filled 2017-01-16 (×8): qty 1

## 2017-01-16 MED ORDER — SENNOSIDES-DOCUSATE SODIUM 8.6-50 MG PO TABS
2.0000 | ORAL_TABLET | Freq: Two times a day (BID) | ORAL | Status: DC
  Administered 2017-01-16 – 2017-01-20 (×8): 2 via ORAL
  Filled 2017-01-16 (×8): qty 2

## 2017-01-16 MED ORDER — INFLUENZA VAC SPLIT QUAD 0.5 ML IM SUSY
0.5000 mL | PREFILLED_SYRINGE | INTRAMUSCULAR | Status: AC
  Administered 2017-01-17: 0.5 mL via INTRAMUSCULAR

## 2017-01-16 MED ORDER — SODIUM CHLORIDE 0.9 % IV SOLN
Freq: Once | INTRAVENOUS | Status: AC
  Administered 2017-01-17: 07:00:00 via INTRAVENOUS

## 2017-01-16 MED ORDER — LORAZEPAM 2 MG/ML IJ SOLN
1.0000 mg | Freq: Four times a day (QID) | INTRAMUSCULAR | Status: AC | PRN
Start: 2017-01-16 — End: 2017-01-19
  Administered 2017-01-17: 1 mg via INTRAVENOUS
  Filled 2017-01-16: qty 1

## 2017-01-16 MED ORDER — ONDANSETRON HCL 4 MG PO TABS: 4.0000 mg | ORAL_TABLET | Freq: Four times a day (QID) | ORAL | Status: DC | PRN

## 2017-01-16 MED ORDER — THIAMINE HCL 100 MG/ML IJ SOLN: 100.0000 mg | Freq: Every day | INTRAMUSCULAR | Status: DC

## 2017-01-16 MED ORDER — ALBUTEROL SULFATE HFA 108 (90 BASE) MCG/ACT IN AERS: 2.0000 | INHALATION_SPRAY | Freq: Four times a day (QID) | RESPIRATORY_TRACT | Status: DC | PRN

## 2017-01-16 MED ORDER — LORAZEPAM 1 MG PO TABS
1.0000 mg | ORAL_TABLET | Freq: Four times a day (QID) | ORAL | Status: AC | PRN
  Administered 2017-01-16: 1 mg via ORAL
  Filled 2017-01-16: qty 1

## 2017-01-16 NOTE — ED Notes (Signed)
Report attempted 

## 2017-01-16 NOTE — Progress Notes (Signed)
Soap suds enema given at this time per order.  Will continue to monitor.  Patient tolerated well.

## 2017-01-16 NOTE — ED Triage Notes (Signed)
Pt. Coming from home via [facility] for generalized pain x 2 weeks. Pt. Hx of alcohol abuse and has slurred speech today. Pt. States she only had half a cup of beer today. Pt. States the majority of the pain is bilateral hip and lower abdomen pain. EMS states pt. Lungs clear bilaterally and AOx4. Pt. Usually drinks 40oz of beer per day she states. Pt. CBG 85.

## 2017-01-16 NOTE — H&P (Signed)
[facility] Admission History and Physical Service Pager: [phone]  Patient name: Sherry Baker Medical record number: [id number] Date of birth: 04/08/1966 Age: 51 y.o. Gender: female  Primary Care Provider: No PCP Per Patient Consultants: None Code Status: Full  Chief Complaint: Epigastric abdominal pain  Assessment and Plan: Sherry Baker is a 51 y.o. female presenting with epigastric abdominal pain and anemia. PMH is significant for substance abuse (alcohol, tobacco, cocaine), bipolar disorder, and protein-calorie malnutrition.  Epigastric Abdominal Pain: May be secondary to gastritis vs peptic ulcer disease, especially given her alcohol and tobacco use in combination with daily NSAID use. GERD also a possibility, although Pt denies heartburn. Pancreatitis ruled out with normal lipase. ACS also a concern, although Pt denies true chest pain. Severe constipation may also be contributing. Family history notable for stomach cancer in her mother (unsure what age she was diagnosed). - Admit to med-surg under observation status, attending Dr. Gwendlyn Deutscher - Start Protonix 40mg  daily - GI cocktail prn - EKG and trend troponins to rule out ACS, especially with current cocaine use. - Consider GI consult in the morning. - Avoid NSAIDs - Repeat CBC and CMET in the AM.  Microcytic Anemia: Hgb in the ED was 7.5, MCV 61.3. Likely iron deficiency. GI bleed is a concern, especially given her epigastric abdominal pain. Bleeding esophageal varices also on the differential, given her alcohol abuse history. Pt denies any hematemesis or hematochezia. FOBT performed by EDP was negative. Pt also denies vaginal bleeding and has not had a period in "years". No RBCs present in UA and patient denies hematuria. PT/INR and platelets all normal. - No cardiac history, so transfusion threshold would be < 7. - Recheck CBC now - Iron, TIBC, ferritin to look for iron deficiency - Haptoglobin and LDH  to look for hemolysis - Reticulocytes ordered - Vitamin B12 and folate ordered, given her alcohol abuse and malnutrition - Repeat FOBT ordered to rule out GI bleed.  - Multivitamin, folate, thiamine per CIWA protocol  Severe constipation: Pt endorses rectal pain and states she has not had a bowel movement in a couple weeks, but has had frequent gas. She endorses straining. She has had 1-2 episodes of vomiting. On exam, abdomen very mildly distended with hyperactive bowel sounds, no rebound/guarding.  - KUB ordered to look for obstruction. Pending results, may need to consider CT abd/pelvis to definitively rule out SBO. - Miralax bid and Senokot-S bid ordered - Will order enema if KUB negative for obstruction.  Mild Hypoglycemia: Glucose 64 in the ED. Likely related to malnutrition and alcohol use. - D5-NS at maintenance rate x 12 hours  Polysubstance Abuse: Pt drinks two 40oz beers per week, but does occasionally drink more when "she has friends over". Also uses cocaine occasionally. In the ED, ethyl alcohol level 235 and UDS positive for cocaine. Ammonia 15. - CIWA protocol - Thiamine, folate, and multivitamin ordered - HIV ordered - SW consult  Tobacco Abuse: Smokes 6-7 cigarettes per day for "many years". Some wheezing on exam, but no diagnosed lung conditions. - Declines nicotine patch - Would benefit from outpatient PFTs - Albuterol prn  Arthritis: Pt notes bilateral hip pain. She was supposed to follow-up with an orthopedic surgeon but she missed the appointment. - PT/OT consult  Protein-Calorie Malnutrition: BMI 18. Likely malnutrition in the setting of polysubstance abuse. - Nutrition consult  Bipolar disorder: Noted in the chart, but Pt not on any medications. - Consider outpatient psychiatry referral  FEN/GI:  - Protonix  as above - Miralax and Senokot-S for bowel regimen, may need to add enema - Regular diet Prophylaxis: SCDs in the setting of possible GI  bleed  Disposition: Pending stabilization of hemoglobin. Anticipate discharge home in 1-2 days if no active GI bleed.  History of Present Illness:  Sherry Baker is a 51 y.o. female presenting with epigastric abdominal started a few nights ago. The pain feels like "stabbing" and "sharp". The pain does not radiate. The pain is worse with moving. Not worse with eating. Nothing makes the abdominal pain better. She has vomited 1-2 times in the last few days. No hematemesis. No heartburn. Every morning, she takes two 200mg  Aleve tablets. She uses two Goody's or BC powders per week. She also uses occasional Advil. LMP was "years ago". No vaginal bleeding. No shortness of breath, no palpitations, no chest pain. She also endorses rectal pain for the past couple of weeks. The pain feels like "something is going inside". She endorses constipation. Her last bowel movement was a couple weeks ago. She is having to strain. When she does have a bowel movement, it is very small. No bloody bowel movements, except for a year ago. No diarrhea. She also notes bilateral hip pain. This has been going on for years. She was supposed to see Orthopedic Surgery as an outpatient, but didn't go to the appointment.  In the ED, she was normotensive with a normal HR. O2 sats 98-100% on RA. Labs significant for Hgb 7.5, MCV 61.3, PT 15.2, INR 1.19. UA non-infectious. Ethyl alcohol level 235. UDS positive for cocaine. FOBT negative. She was admitted for further work-up.  Review Of Systems: Per HPI with the following additions: see below.  Review of Systems  Constitutional: Positive for chills and fever.  HENT: Negative for congestion and sore throat.   Eyes: Negative for blurred vision and double vision.  Respiratory: Negative for cough and shortness of breath.   Cardiovascular: Negative for chest pain and palpitations.  Gastrointestinal: Positive for abdominal pain, constipation, nausea and vomiting. Negative for blood in stool and  melena.  Genitourinary: Positive for frequency. Negative for dysuria.  Musculoskeletal: Positive for back pain and joint pain.  Skin: Negative for rash.  Neurological: Positive for dizziness. Negative for seizures.  Psychiatric/Behavioral: Positive for depression. The patient is nervous/anxious.     Patient Active Problem List   Diagnosis Date Noted  . Protein-calorie malnutrition, severe ([place]) 02/13/2014  . Suicide ideation 02/12/2014  . Suicidal ideation 02/11/2014  . Alcohol intoxication ([place]) 02/11/2014  . Transaminitis 02/11/2014  . Substance abuse 02/11/2014  . Depression 02/11/2014  . Thrombocytopenia ([place]) 02/11/2014    Past Medical History: Past Medical History:  Diagnosis Date  . Alcohol abuse   . Arthritis   . Bipolar 1 disorder ([place])   . Cirrhosis of liver ([place])   . Depression   . Hypertension     Past Surgical History: Past Surgical History:  Procedure Laterality Date  . MANDIBLE FRACTURE SURGERY      Social History: Social History  Substance Use Topics  . Smoking status: Current Every Day Smoker    Packs/day: 1.00    Years: 10.00    Types: Cigarettes  . Smokeless tobacco: Former Systems developer  . Alcohol use Yes     Comment: alcohol abuse    Additional social history: Smokes 6-7 cigarettes/day. Drinks two 40oz beers/day. Occasional cocaine use. Lives with best friend Laverna Peace).  Please also refer to relevant sections of EMR.  Family History: Mother- stomach cancer Maternal  aunt- throat cancer   Allergies and Medications: Allergies  Allergen Reactions  . Penicillins Hives and Swelling    Has patient had a PCN reaction causing immediate rash, facial/tongue/throat swelling, SOB or lightheadedness with hypotension: Yes Has patient had a PCN reaction causing severe rash involving mucus membranes or skin necrosis: No Has patient had a PCN reaction that required hospitalization No Has patient had a PCN reaction occurring within the last 10 years: Yes If all  of the above answers are "NO", then may proceed with Cephalosporin use.    No current facility-administered medications on file prior to encounter.    Current Outpatient Prescriptions on File Prior to Encounter  Medication Sig Dispense Refill  . [DISCONTINUED] ziprasidone (GEODON) 40 MG capsule Take 1 capsule (40 mg total) by mouth 2 (two) times daily with a meal. 20 capsule 0    Objective: BP 103/64   Pulse 95   Temp 98.3 F (36.8 C) (Oral)   Resp (!) 22   Ht 5\' 3"  (1.6 m)   Wt 102 lb (46.3 kg)   SpO2 98%   BMI 18.07 kg/m  Exam: General: Cachetic; anxious-appearing and moving a lot throughout exam, but pleasant; in NAD Eyes: Pupils enlarged but symmetric and reactive, no scleral icterus ENTM: Nose normal, oropharynx clear, MMM Neck: Supple, no cervical adenopathy or thryomegaly Cardiovascular: RRR, no murmurs Respiratory: Diffuse expiratory wheezing but loudest in the lung bases, normal work of breathing Gastrointestinal: Hyperactive bowel sounds, very mild distension, tenderness to palpation in the epigastric area, stool palpated in the left side of the abdomen, no rebound, no guarding MSK: No edema, warm and well-perfused. Derm: No rashes or lesions Neuro: Awake, alert, oriented x 3, CN 2-12 grossly intact, no focal deficits Psych: Mildly increased rate of speech, appears mildly anxious.  Labs and Imaging: CBC BMET   Recent Labs Lab 01/16/17 1038  WBC 9.0  HGB 7.5*  HCT 26.5*  PLT 341    Recent Labs Lab 01/16/17 1038  NA 138  K 4.0  CL 105  CO2 20*  BUN <5*  CREATININE 0.43*  GLUCOSE 64*  CALCIUM 8.9      Sela Hua, MD 01/16/2017, 4:04 PM PGY-2, [place] Intern pager: [phone], text pages welcome

## 2017-01-16 NOTE — ED Notes (Signed)
Pt. Screaming that she wants to go home. EDP made aware. Pt. Given soup/drink for low blood sugar at this time with EDP approval.

## 2017-01-16 NOTE — ED Notes (Signed)
Taken to Xray.

## 2017-01-16 NOTE — ED Provider Notes (Addendum)
[facility] DEPT Provider Note   CSN: [id number] Arrival date & time: 01/16/17  1016     History   Chief Complaint Chief Complaint  Patient presents with  . Hip Pain  . Abdominal Pain    HPI Sherry Baker is a 51 y.o. female with a past medical history of alcohol abuse, bipolar 1, cirrhosis, HTN who presents to the ED today with multiple complaints. Patient reports generalized pain. She states she has had bilateral hip pain ongoing for several years and that she is supposed to have a hip replacement at some point in time. She is also having ongoing diffuse abdominal pain that has been present for several years. More acutely, she states that her rectum in her vagina feels very swollen. She is also noticed some bright red blood in her stool with clots. She has rectal pain with bowel movements. Patient also states that occasionally she gets nauseated and yesterday she vomited and saw a small amount of blood. Patient takes over to Aleve's every single day for joint pains. She also drinks alcohol daily, 40 ounces of beer each day. She has no interest of the liver. She has never had a screening EGD. She denies any fevers, chills, chest pain or shortness of breath.  HPI  Past Medical History:  Diagnosis Date  . Alcohol abuse   . Arthritis   . Bipolar 1 disorder ([place])   . Cirrhosis of liver ([place])   . Depression   . Hypertension     Patient Active Problem List   Diagnosis Date Noted  . Protein-calorie malnutrition, severe ([place]) 02/13/2014  . Suicide ideation 02/12/2014  . Suicidal ideation 02/11/2014  . Alcohol intoxication ([place]) 02/11/2014  . Transaminitis 02/11/2014  . Substance abuse 02/11/2014  . Depression 02/11/2014  . Thrombocytopenia ([place]) 02/11/2014    Past Surgical History:  Procedure Laterality Date  . MANDIBLE FRACTURE SURGERY      OB History    No data available       Home Medications    Prior to Admission medications   Medication Sig Start Date End Date  Taking? Authorizing Provider  albuterol (PROVENTIL HFA;VENTOLIN HFA) 108 (90 BASE) MCG/ACT inhaler Inhale 2 puffs into the lungs every 6 (six) hours as needed for wheezing or shortness of breath.    Historical Provider, MD    Family History History reviewed. No pertinent family history.  Social History Social History  Substance Use Topics  . Smoking status: Current Every Day Smoker    Packs/day: 1.00    Years: 10.00    Types: Cigarettes  . Smokeless tobacco: Former Systems developer  . Alcohol use Yes     Comment: alcohol abuse      Allergies   Penicillins   Review of Systems Review of Systems  All other systems reviewed and are negative.    Physical Exam Updated Vital Signs BP (!) 148/98 (BP Location: Right Arm)   Pulse 80   Temp 98.3 F (36.8 C) (Oral)   Resp 18   Ht 5\' 3"  (1.6 m)   Wt 46.3 kg   SpO2 98%   BMI 18.07 kg/m   Physical Exam  Constitutional: She is oriented to person, place, and time. No distress.  Appears intoxicated  HENT:  Head: Normocephalic and atraumatic.  Mouth/Throat: No oropharyngeal exudate.  Eyes: Conjunctivae and EOM are normal. Pupils are equal, round, and reactive to light. Right eye exhibits no discharge. Left eye exhibits no discharge. No scleral icterus.  Neck: Normal range of motion.  Neck supple.  Cardiovascular: Normal rate, regular rhythm, normal heart sounds and intact distal pulses.  Exam reveals no gallop and no friction rub.   No murmur heard. Pulmonary/Chest: Effort normal and breath sounds normal. No respiratory distress. She has no wheezes. She has no rales. She exhibits no tenderness.  Abdominal: Soft. She exhibits no distension. There is no tenderness. There is no guarding.  Genitourinary:  Genitourinary Comments: No stool or gross blood seen on rectal exam. Pelvic exam without bleeding or swelling.  Musculoskeletal: Normal range of motion. She exhibits no edema.  TTP over bilateral hips, knees and ankles without obvious  abnormality. No ecchymosis, swelling. Patient moans if touched in either of these locations.  Neurological: She is alert and oriented to person, place, and time. No cranial nerve deficit.  Skin: Skin is warm and dry. No rash noted. She is not diaphoretic. No erythema. No pallor.  Nursing note and vitals reviewed.    ED Treatments / Results  Labs (all labs ordered are listed, but only abnormal results are displayed) Labs Reviewed  ETHANOL  HEPATIC FUNCTION PANEL  BASIC METABOLIC PANEL  CBC WITH DIFFERENTIAL/PLATELET  PROTIME-INR  LIPASE, BLOOD  URINALYSIS, ROUTINE W REFLEX MICROSCOPIC  RAPID URINE DRUG SCREEN, HOSP PERFORMED  AMMONIA  I-STAT BETA HCG BLOOD, ED ([facility], [facility], [facility] ONLY)  POC OCCULT BLOOD, ED    EKG  EKG Interpretation None       Radiology No results found.  Procedures Procedures (including critical care time)  Medications Ordered in ED Medications - No data to display   Initial Impression / Assessment and Plan / ED Course  I have reviewed the triage vital signs and the nursing notes.  Pertinent labs & imaging results that were available during my care of the patient were reviewed by me and considered in my medical decision making (see chart for details).     51 year old female with history of alcohol abuse, cirrhosis presents to the ED with multiple complaints including generalized pain in her hips and knees and abdomen. This pain appears to be chronic in nature as it isn't present for several years. However she does complain of ongoing rectal bleeding and rectal/vaginal swelling. There is no swelling on my digital rectal or pelvic exam. No Stool or gross blood either. However, hemoglobin is low at 7.5. Per chart review her previous hemoglobin was between 11 and 12. She does report to me that she had some hematemesis yesterday. She is intoxicated in the ED so she is an unreliable historian. However, given her underlying cirrhosis and new anemia feel that she  would warrant admission, possible EGD as she has never had one to screen for varices. She does not have any coagulopathy or thrombocytopenia. Albumin is slightly low at 3.1. Synthetic liver function appears to be intact. Blood sugar was also low at 64. She was given Coca-Cola and some soup and on recheck her blood sugar came up to 95.  Will consult hospitalist for admission.  Spoke with Dr. Brett Albino with family medicine service who will admit patient to the hospital.  Final Clinical Impressions(s) / ED Diagnoses   Final diagnoses:  Anemia, unspecified type    New Prescriptions New Prescriptions   No medications on file     Carlos Levering, PA-C 01/16/17 [place], MD 01/19/17 1900   Medical screening examination/treatment/procedure(s) were performed by non-physician practitioner and as supervising physician I was immediately available for consultation/collaboration.   EKG Interpretation None  Fredia Sorrow, MD 03/17/17 [phone]

## 2017-01-16 NOTE — Progress Notes (Signed)
Dr. Brett Albino notified of hgb = 6.8.  Will continue to monitor.

## 2017-01-16 NOTE — Progress Notes (Signed)
Report received from ED 

## 2017-01-17 ENCOUNTER — Observation Stay (HOSPITAL_COMMUNITY): Payer: Medicaid Other

## 2017-01-17 ENCOUNTER — Encounter (HOSPITAL_COMMUNITY): Payer: Self-pay | Admitting: Radiology

## 2017-01-17 DIAGNOSIS — D125 Benign neoplasm of sigmoid colon: Secondary | ICD-10-CM | POA: Diagnosis present

## 2017-01-17 DIAGNOSIS — Z808 Family history of malignant neoplasm of other organs or systems: Secondary | ICD-10-CM | POA: Diagnosis not present

## 2017-01-17 DIAGNOSIS — K644 Residual hemorrhoidal skin tags: Secondary | ICD-10-CM | POA: Diagnosis present

## 2017-01-17 DIAGNOSIS — Z800 Family history of malignant neoplasm of digestive organs: Secondary | ICD-10-CM | POA: Diagnosis not present

## 2017-01-17 DIAGNOSIS — R109 Unspecified abdominal pain: Secondary | ICD-10-CM | POA: Diagnosis present

## 2017-01-17 DIAGNOSIS — D62 Acute posthemorrhagic anemia: Secondary | ICD-10-CM | POA: Diagnosis not present

## 2017-01-17 DIAGNOSIS — E46 Unspecified protein-calorie malnutrition: Secondary | ICD-10-CM | POA: Diagnosis not present

## 2017-01-17 DIAGNOSIS — E876 Hypokalemia: Secondary | ICD-10-CM | POA: Diagnosis not present

## 2017-01-17 DIAGNOSIS — M160 Bilateral primary osteoarthritis of hip: Secondary | ICD-10-CM | POA: Diagnosis present

## 2017-01-17 DIAGNOSIS — F419 Anxiety disorder, unspecified: Secondary | ICD-10-CM | POA: Diagnosis present

## 2017-01-17 DIAGNOSIS — C20 Malignant neoplasm of rectum: Secondary | ICD-10-CM | POA: Diagnosis not present

## 2017-01-17 DIAGNOSIS — I10 Essential (primary) hypertension: Secondary | ICD-10-CM | POA: Diagnosis present

## 2017-01-17 DIAGNOSIS — R6510 Systemic inflammatory response syndrome (SIRS) of non-infectious origin without acute organ dysfunction: Secondary | ICD-10-CM | POA: Diagnosis not present

## 2017-01-17 DIAGNOSIS — K5900 Constipation, unspecified: Secondary | ICD-10-CM | POA: Diagnosis not present

## 2017-01-17 DIAGNOSIS — F10229 Alcohol dependence with intoxication, unspecified: Secondary | ICD-10-CM | POA: Diagnosis not present

## 2017-01-17 DIAGNOSIS — F319 Bipolar disorder, unspecified: Secondary | ICD-10-CM | POA: Diagnosis not present

## 2017-01-17 DIAGNOSIS — Z23 Encounter for immunization: Secondary | ICD-10-CM | POA: Diagnosis not present

## 2017-01-17 DIAGNOSIS — R509 Fever, unspecified: Secondary | ICD-10-CM

## 2017-01-17 DIAGNOSIS — Y907 Blood alcohol level of 200-239 mg/100 ml: Secondary | ICD-10-CM | POA: Diagnosis present

## 2017-01-17 DIAGNOSIS — K5730 Diverticulosis of large intestine without perforation or abscess without bleeding: Secondary | ICD-10-CM | POA: Diagnosis present

## 2017-01-17 DIAGNOSIS — E162 Hypoglycemia, unspecified: Secondary | ICD-10-CM | POA: Diagnosis not present

## 2017-01-17 DIAGNOSIS — F17210 Nicotine dependence, cigarettes, uncomplicated: Secondary | ICD-10-CM | POA: Diagnosis not present

## 2017-01-17 DIAGNOSIS — F1410 Cocaine abuse, uncomplicated: Secondary | ICD-10-CM | POA: Diagnosis not present

## 2017-01-17 DIAGNOSIS — Z681 Body mass index (BMI) 19 or less, adult: Secondary | ICD-10-CM | POA: Diagnosis not present

## 2017-01-17 LAB — COMPREHENSIVE METABOLIC PANEL
ALBUMIN: 2.5 g/dL — AB (ref 3.5–5.0)
ALT: 9 U/L — ABNORMAL LOW (ref 14–54)
ANION GAP: 7 (ref 5–15)
AST: 19 U/L (ref 15–41)
Alkaline Phosphatase: 54 U/L (ref 38–126)
BUN: 8 mg/dL (ref 6–20)
CALCIUM: 8.4 mg/dL — AB (ref 8.9–10.3)
CO2: 24 mmol/L (ref 22–32)
CREATININE: 0.52 mg/dL (ref 0.44–1.00)
Chloride: 100 mmol/L — ABNORMAL LOW (ref 101–111)
Glucose, Bld: 121 mg/dL — ABNORMAL HIGH (ref 65–99)
Potassium: 3.8 mmol/L (ref 3.5–5.1)
SODIUM: 131 mmol/L — AB (ref 135–145)
Total Bilirubin: 0.4 mg/dL (ref 0.3–1.2)
Total Protein: 7 g/dL (ref 6.5–8.1)

## 2017-01-17 LAB — CBC
HCT: 21.8 % — ABNORMAL LOW (ref 36.0–46.0)
HEMOGLOBIN: 6.3 g/dL — AB (ref 12.0–15.0)
MCH: 17.5 pg — AB (ref 26.0–34.0)
MCHC: 28.9 g/dL — AB (ref 30.0–36.0)
MCV: 60.7 fL — ABNORMAL LOW (ref 78.0–100.0)
Platelets: 287 10*3/uL (ref 150–400)
RBC: 3.59 MIL/uL — AB (ref 3.87–5.11)
RDW: 21.3 % — ABNORMAL HIGH (ref 11.5–15.5)
WBC: 9.5 10*3/uL (ref 4.0–10.5)

## 2017-01-17 LAB — HEMOGLOBIN AND HEMATOCRIT, BLOOD
HEMATOCRIT: 24.9 % — AB (ref 36.0–46.0)
Hemoglobin: 7.4 g/dL — ABNORMAL LOW (ref 12.0–15.0)

## 2017-01-17 LAB — LACTIC ACID, PLASMA
LACTIC ACID, VENOUS: 1.5 mmol/L (ref 0.5–1.9)
Lactic Acid, Venous: 1 mmol/L (ref 0.5–1.9)

## 2017-01-17 LAB — MRSA PCR SCREENING: MRSA BY PCR: POSITIVE — AB

## 2017-01-17 LAB — HIV ANTIBODY (ROUTINE TESTING W REFLEX): HIV Screen 4th Generation wRfx: NONREACTIVE

## 2017-01-17 LAB — OCCULT BLOOD X 1 CARD TO LAB, STOOL: Fecal Occult Bld: POSITIVE — AB

## 2017-01-17 LAB — TROPONIN I: Troponin I: <0.03 ng/mL (ref <0.03)

## 2017-01-17 MED ORDER — CHLORHEXIDINE GLUCONATE CLOTH 2 % EX PADS
6.0000 | MEDICATED_PAD | Freq: Every day | CUTANEOUS | Status: AC
  Administered 2017-01-17 – 2017-01-21 (×5): 6 via TOPICAL

## 2017-01-17 MED ORDER — ACETAMINOPHEN 325 MG PO TABS
650.0000 mg | ORAL_TABLET | Freq: Four times a day (QID) | ORAL | Status: DC | PRN
Start: 2017-01-17 — End: 2017-01-17
  Administered 2017-01-17: 650 mg via ORAL
  Filled 2017-01-17: qty 2

## 2017-01-17 MED ORDER — DEXTROSE 5 % IV SOLN
2.0000 g | Freq: Two times a day (BID) | INTRAVENOUS | Status: DC
  Administered 2017-01-18 – 2017-01-23 (×11): 2 g via INTRAVENOUS
  Filled 2017-01-17 (×12): qty 2

## 2017-01-17 MED ORDER — METRONIDAZOLE IN NACL 5-0.79 MG/ML-% IV SOLN
500.0000 mg | Freq: Four times a day (QID) | INTRAVENOUS | Status: DC
  Administered 2017-01-17 – 2017-01-23 (×24): 500 mg via INTRAVENOUS
  Filled 2017-01-17 (×24): qty 100

## 2017-01-17 MED ORDER — DEXTROSE 5 % IV SOLN
2.0000 g | Freq: Once | INTRAVENOUS | Status: AC
  Administered 2017-01-17: 2 g via INTRAVENOUS
  Filled 2017-01-17: qty 2

## 2017-01-17 MED ORDER — ACETAMINOPHEN 325 MG PO TABS
650.0000 mg | ORAL_TABLET | Freq: Once | ORAL | Status: AC
Start: 2017-01-17 — End: 2017-01-17
  Administered 2017-01-17: 650 mg via ORAL
  Filled 2017-01-17: qty 2

## 2017-01-17 MED ORDER — OXYCODONE HCL 5 MG PO TABS
5.0000 mg | ORAL_TABLET | Freq: Once | ORAL | Status: AC
  Administered 2017-01-17: 5 mg via ORAL
  Filled 2017-01-17: qty 1

## 2017-01-17 MED ORDER — TRAMADOL HCL 50 MG PO TABS: 50.0000 mg | ORAL_TABLET | Freq: Four times a day (QID) | ORAL | Status: DC | PRN

## 2017-01-17 MED ORDER — IOPAMIDOL (ISOVUE-300) INJECTION 61%
INTRAVENOUS | Status: AC
  Administered 2017-01-17: 100 mL
  Filled 2017-01-17: qty 100

## 2017-01-17 MED ORDER — ACETAMINOPHEN 325 MG PO TABS
650.0000 mg | ORAL_TABLET | Freq: Four times a day (QID) | ORAL | Status: AC | PRN
  Administered 2017-01-17 – 2017-01-19 (×4): 650 mg via ORAL
  Filled 2017-01-17 (×4): qty 2

## 2017-01-17 MED ORDER — IOPAMIDOL (ISOVUE-300) INJECTION 61%
INTRAVENOUS | Status: AC
  Administered 2017-01-17: 60 mL
  Filled 2017-01-17: qty 30

## 2017-01-17 MED ORDER — MUPIROCIN 2 % EX OINT
1.0000 "application " | TOPICAL_OINTMENT | Freq: Two times a day (BID) | CUTANEOUS | Status: AC
  Administered 2017-01-17 – 2017-01-21 (×9): 1 via NASAL
  Filled 2017-01-17 (×2): qty 22

## 2017-01-17 MED ORDER — SODIUM CHLORIDE 0.9 % IV SOLN
INTRAVENOUS | Status: DC
  Administered 2017-01-17 – 2017-01-23 (×7): via INTRAVENOUS
  Filled 2017-01-17 (×7): qty 1000

## 2017-01-17 MED ORDER — ENSURE ENLIVE PO LIQD
237.0000 mL | Freq: Three times a day (TID) | ORAL | Status: DC
  Administered 2017-01-17 – 2017-01-19 (×7): 237 mL via ORAL

## 2017-01-17 NOTE — Progress Notes (Signed)
Family Medicine Teaching Service Daily Progress Note Intern Pager: [phone]  Patient name: Sherry Baker Medical record number: [id number] Date of birth: 1966-03-02 Age: 51 y.o. Gender: female  Primary Care Provider: No PCP Per Patient Consultants: None Code Status: Full  Pt Overview and Major Events to Date:  3/16- Admitted to FMTS with epigastric pain and anemia 3/17- Hgb dropped to 6.63 > 1u pRBCs ordered; developed fever to 101.8 > infectious work-up  Assessment and Plan: Lyah Millirons is a 51 y.o. female presenting with epigastric abdominal pain and anemia. PMH is significant for substance abuse (alcohol, tobacco, cocaine), bipolar disorder, and protein-calorie malnutrition.  Fever: Unknown cause. Pt states this has been happening for a year. Pt had rectal temperature to 101.36F last night. Pneumonia unlikely without cough/SOB and with negative CXR. UTI unlikely without urinary symptoms and with non-infectious UA. Bacteremia unlikely, as Pt does not have any risk factors such as HD or long term IV. URI source unlikely without URI symptoms. GI source unlikely without diarrhea (Pt has been having constipation). Could be appendicitis, with an atypical presentation. Her alcoholism makes her relatively immunocompromised. Lactic acid 1.5. - Blood and urine cultures pending - Holding on antibiotics because unsure what is the source or if this is a true infection - Will order CT abdomen/pelvis to rule out intra-abdominal abscess vs appendicitis, since her presenting symptom was abdominal pain. - Could consider doing an ECHO to rule out endocarditis, given her polysubstance abuse hx (although Pt denies IV drug use). - HIV pending  Microcytic Anemia: Worsening. Hgb 7.5 > 6.8 > 6.3. No signs of active bleeding per the patient or the nurse. Iron panel with iron deficiency (ferritin 5). LDH and bili normal, making hemolysis less likely. B12 nl. Reticulocytes normal instead of increased as you would  expect. - 1u pRBCs ordered, but cannot start this with fever. Follow-up post-transfusion H/H. - Holding on IV iron for now with possible active infection - Haptoglobin pending - Folate pending - Repeat FOBT ordered to rule out GI bleed.  - Multivitamin, folate, thiamine per CIWA protocol.  Epigastric Abdominal Pain: May be secondary to gastritis vs peptic ulcer disease, especially given her alcohol and tobacco use in combination with daily NSAID use. ACS unlikely with negative troponins. - Continue Protonix 40mg  daily - GI cocktail prn - Consider GI consult, pending results of CT abdomen or if Pt develops hematemesis or hematochezia/melena. - Avoid NSAIDs  Severe constipation: Improved. Pt had large BM after enema. - Miralax bid and Senokot-S bid   Hyponatremia: Na dropped from 138 on admission to 131. - Switch D5-NS to NS at maintenance rate today (initial hypoglycemia resolved).  Polysubstance Abuse: Daily alcohol use of two 40oz beers and occasional cocaine use. CIWA score of 2 overnight. - CIWA protocol - Thiamine, folate, and multivitamin ordered - HIV ordered - SW consult  Tobacco Abuse: Smokes 6-7 cigarettes per day for "many years". Some wheezing on exam, but no diagnosed lung conditions. - Declines nicotine patch - Would benefit from outpatient PFTs - Albuterol prn  Arthritis: Pt notes bilateral hip pain. She was supposed to follow-up with an orthopedic surgeon but she missed the appointment. - PT/OT consult  Protein-Calorie Malnutrition: BMI 18. Likely malnutrition in the setting of polysubstance abuse. - Nutrition consult  Bipolar disorder: Noted in the chart, but Pt not on any medications. - Consider outpatient psychiatry referral  FEN/GI:  - Protonix as above - Miralax and Senokot-S for bowel regimen - Regular diet - NS at maintenance rate  for today Prophylaxis: SCDs in the setting of possible GI bleed  Disposition: Pending stabilization of  hemoglobin and further infectious and anemia work-up. Anticipate discharge home in 2-3 days.  Subjective:  Pt states she feels completely fine this morning. No dysuria, no cough, no shortness of breath, no chest pain. She states her abdominal pain is better, but is still there.  Objective: Temp:  [98.3 F (36.8 C)-102 F (38.9 C)] 100.8 F (38.2 C) (03/17 0509) Pulse Rate:  [79-112] 107 (03/17 0509) Resp:  [13-25] 16 (03/17 0509) BP: (103-164)/(60-98) 104/66 (03/17 0509) SpO2:  [95 %-100 %] 96 % (03/17 0509) Weight:  [102 lb (46.3 kg)] 102 lb (46.3 kg) (03/16 1023) Physical Exam: General: Sleepy but easily arousable, laying in bed, in NAD HEENT: [place]/AT, EOMI Neck: Supple, no cervical adenopathy or thryomegaly Cardiovascular: RRR, no murmurs Respiratory: Diffuse expiratory wheezing but loudest in the lung bases, normal work of breathing Gastrointestinal: +BS, +mild tenderness to palpation in the epigastric area, no distension, no rebound, no guarding. MSK: No edema, warm and well-perfused. Neuro: Awake, alert, oriented x 3, CN 2-12 grossly intact, no focal deficits  Laboratory:  Recent Labs Lab 01/16/17 1038 01/16/17 2146 01/17/17 0400  WBC 9.0 9.8 9.5  HGB 7.5* 6.8* 6.3*  HCT 26.5* 23.6* 21.8*  PLT 341 304 287    Recent Labs Lab 01/16/17 1038 01/17/17 0400  NA 138 131*  K 4.0 3.8  CL 105 100*  CO2 20* 24  BUN <5* 8  CREATININE 0.43* 0.52  CALCIUM 8.9 8.4*  PROT 8.6* 7.0  BILITOT 0.4 0.4  ALKPHOS 63 54  ALT 11* 9*  AST 30 19  GLUCOSE 64* 121*   -Iron 13, TIBC 494, ferritin 5 -B12 nl -Retic 1.7 -LDH nl -Lactic acid 1.5 -UA- nl -UDS- +cocaine -Ethyl alcohol 235  Imaging/Diagnostic Tests: AXR- moderate stool  Sela Hua, MD 01/17/2017, 5:37 AM PGY-2, [place] Intern pager: [phone], text pages welcome

## 2017-01-17 NOTE — Progress Notes (Signed)
Pharmacy Antibiotic Note  Sherry Baker is a 51 y.o. female admitted on 01/16/2017 with epigastric pain and fevers. Pharmacy has been consulted for Cefepime dosing for intra-abdominal coverage.   Noted allergy to penicillins listed as hives and swelling (confirmed with patient). There are no records of cephalosporin use and none the patient can recall. Discussed with MD and to continue with Cefepime however will monitor for any reaction.   Plan: 1. Cefepime 2g IV every 12 hours 2. Will continue to follow renal function, culture results, LOT, and antibiotic de-escalation plans   Height: 5\' 3"  (160 cm) Weight: 114 lb 4.8 oz (51.8 kg) IBW/kg (Calculated) : 52.4  Temp (24hrs), Avg:100.4 F (38 C), Min:98.4 F (36.9 C), Max:102 F (38.9 C)   Recent Labs Lab 01/16/17 1038 01/16/17 2146 01/17/17 0400 01/17/17 0847  WBC 9.0 9.8 9.5  --   CREATININE 0.43*  --  0.52  --   LATICACIDVEN  --   --  1.5 1.0    Estimated Creatinine Clearance: 69.6 mL/min (by C-G formula based on SCr of 0.52 mg/dL).    Allergies  Allergen Reactions  . Penicillins Hives and Swelling    Has patient had a PCN reaction causing immediate rash, facial/tongue/throat swelling, SOB or lightheadedness with hypotension: Yes Has patient had a PCN reaction causing severe rash involving mucus membranes or skin necrosis: No Has patient had a PCN reaction that required hospitalization No Has patient had a PCN reaction occurring within the last 10 years: Yes If all of the above answers are "NO", then may proceed with Cephalosporin use.     Antimicrobials this admission: Cefepime 3/17 >>  Dose adjustments this admission: n/a  Microbiology results: 3/16 MRSA PCR >> positive 3/17 BCx >> 3/17 UCx >>  Thank you for allowing pharmacy to be a part of this patient's care.  Alycia Rossetti, PharmD, BCPS Clinical Pharmacist Pager: [phone] 01/17/2017 2:37 PM

## 2017-01-17 NOTE — Evaluation (Signed)
Physical Therapy Evaluation Patient Details Name: Sherry Baker MRN: [id number] DOB: Aug 18, 1966 Today's Date: 01/17/2017   History of Present Illness  Pt adm with abdominal pain, anemia and fever. CT revealed rectal mass. PMH - polysubstance abuse, bil hip arthritis, bipolar, HTN  Clinical Impression  Pt doing well with mobility and no further PT needed.  Pt limited by arthritic hip pain. Recommend pt use her walker to reduce pain with amb if needed. Recommend f/u with orthopedist.       Follow Up Recommendations No PT follow up    Equipment Recommendations  None recommended by PT    Recommendations for Other Services       Precautions / Restrictions Precautions Precautions: None      Mobility  Bed Mobility Overal bed mobility: Independent                Transfers Overall transfer level: Modified independent                  Ambulation/Gait Ambulation/Gait assistance: Modified independent (Device/Increase time) Ambulation Distance (Feet): 220 Feet Assistive device: None;Rolling walker (2 wheeled) Gait Pattern/deviations: Step-through pattern;Antalgic Gait velocity: decr Gait velocity interpretation: Below normal speed for age/gender General Gait Details: Pt with antalgic gait due to bilateral hip pain rt>lt. No loss of balance. Stable with or without walker.  Stairs            Wheelchair Mobility    Modified Rankin (Stroke Patients Only)       Balance Overall balance assessment: No apparent balance deficits (not formally assessed)                                           Pertinent Vitals/Pain      Home Living Family/patient expects to be discharged to:: Private residence Living Arrangements: Non-relatives/Friends Available Help at Discharge: Friend(s)           Home Equipment: Gilford Rile - 2 wheels (Pt received after hospitalization 2015. Thinks it's at home)      Prior Function Level of Independence: Independent                Hand Dominance        Extremity/Trunk Assessment   Upper Extremity Assessment Upper Extremity Assessment: Defer to OT evaluation    Lower Extremity Assessment Lower Extremity Assessment: Overall WFL for tasks assessed       Communication   Communication: No difficulties  Cognition Arousal/Alertness: Awake/alert Behavior During Therapy: WFL for tasks assessed/performed Overall Cognitive Status: Within Functional Limits for tasks assessed                      General Comments      Exercises     Assessment/Plan    PT Assessment Patent does not need any further PT services  PT Problem List         PT Treatment Interventions      PT Goals (Current goals can be found in the Care Plan section)  Acute Rehab PT Goals PT Goal Formulation: All assessment and education complete, DC therapy    Frequency     Barriers to discharge        Co-evaluation               End of Session   Activity Tolerance: Patient tolerated treatment well Patient left: in bed;with call bell/phone within  reach;with nursing/sitter in room (and MD) Nurse Communication: Mobility status PT Visit Diagnosis: Difficulty in walking, not elsewhere classified (R26.2)    Functional Assessment Tool Used: AM-PAC 6 Clicks Basic Mobility Functional Limitation: Mobility: Walking and moving around Mobility: Walking and Moving Around Current Status (R3202): At least 1 percent but less than 20 percent impaired, limited or restricted Mobility: Walking and Moving Around Goal Status [phone]): At least 1 percent but less than 20 percent impaired, limited or restricted Mobility: Walking and Moving Around Discharge Status [phone]): At least 1 percent but less than 20 percent impaired, limited or restricted    Time: 8372-9021 PT Time Calculation (min) (ACUTE ONLY): 13 min   Charges:   PT Evaluation $PT Eval Low Complexity: 1 Procedure     PT G Codes:   PT G-Codes **NOT FOR  INPATIENT CLASS** Functional Assessment Tool Used: AM-PAC 6 Clicks Basic Mobility Functional Limitation: Mobility: Walking and moving around Mobility: Walking and Moving Around Current Status (J1552): At least 1 percent but less than 20 percent impaired, limited or restricted Mobility: Walking and Moving Around Goal Status [phone]): At least 1 percent but less than 20 percent impaired, limited or restricted Mobility: Walking and Moving Around Discharge Status [phone]): At least 1 percent but less than 20 percent impaired, limited or restricted     [place] 01/17/2017, 2:42 PM  [facility] PT [phone]

## 2017-01-17 NOTE — Consult Note (Signed)
Referring Provider: Dr. Juleen China  Primary Care Physician:  No PCP Per Patient Primary Gastroenterologist:  [name]  Reason for Consultation:  Abnormal CT showing perirectal abscess/mass  HPI: Sherry Baker is a 51 y.o. female with past medical history of bipolar disorder , depression, alcohol abuse admitted to the hospital with abdominal pain. Patient was found to have anemia. Occult blood positive. Patient started to have a fever. CT abdomen pelvis was ordered which showed no biliary enhancing soft tissue mass of around 5.7 x 4.1 cm with possible small abscess or microperforation. GI is consulted for further evaluation.  Patient seen and examined. According to patient she has been having on and off rectal bleeding since last 1 year. She came into the hospital for worsening epigastric pain as well as constipation of last 3 weeks. Complaining of nausea but denied any  Vomiting.  Complaining of dysphagia or odynophagia.  History of ?? stomach cancer in mother. No previous colonoscopy. Patient admits to daily 40 ounces of alcohol use and occasional cocaine use.  Past Medical History:  Diagnosis Date  . Alcohol abuse   . Arthritis   . Bipolar 1 disorder ([place])   . Cirrhosis of liver ([place])   . Depression   . Hypertension     Past Surgical History:  Procedure Laterality Date  . MANDIBLE FRACTURE SURGERY      Prior to Admission medications   Not on File    Scheduled Meds: . ceFEPime (MAXIPIME) IV  2 g Intravenous Once  . Chlorhexidine Gluconate Cloth  6 each Topical Q0600  . feeding supplement (ENSURE ENLIVE)  237 mL Oral TID BM  . folic acid  1 mg Oral Daily  . Influenza vac split quadrivalent PF  0.5 mL Intramuscular Tomorrow-1000  . metronidazole  500 mg Intravenous Q6H  . multivitamin with minerals  1 tablet Oral Daily  . mupirocin ointment  1 application Nasal BID  . pantoprazole  40 mg Oral Daily  . pneumococcal 23 valent vaccine  0.5 mL Intramuscular Tomorrow-1000  .  polyethylene glycol  17 g Oral BID  . senna-docusate  2 tablet Oral BID  . thiamine  100 mg Oral Daily   Or  . thiamine  100 mg Intravenous Daily   Continuous Infusions: . sodium chloride 0.9 % 1,000 mL infusion 75 mL/hr at 01/17/17 0641   PRN Meds:.acetaminophen, albuterol, gi cocktail, LORazepam **OR** LORazepam, ondansetron **OR** ondansetron (ZOFRAN) IV  Allergies as of 01/16/2017 - Review Complete 01/16/2017  Allergen Reaction Noted  . Penicillins Hives and Swelling 11/05/2011    History reviewed. No pertinent family history.  Social History   Social History  . Marital status: Single    Spouse name: N/A  . Number of children: N/A  . Years of education: N/A   Occupational History  . Not on file.   Social History Main Topics  . Smoking status: Current Every Day Smoker    Packs/day: 0.50    Years: 41.00    Types: Cigarettes  . Smokeless tobacco: Former Systems developer     Comment: patient refused  . Alcohol use 1.2 oz/week    2 Cans of beer per week     Comment: 40oz. beer x 2 everyday  . Drug use: Yes    Types: Marijuana, "Crack" cocaine     Comment: patient states "once in a blue moon"  . Sexual activity: Not Currently    Birth control/ protection: Abstinence   Other Topics Concern  . Not on file   Social  History Narrative  . No narrative on file    Review of Systems:  Review of Systems  Constitutional: Positive for chills, fever and weight loss.  HENT: Negative for ear pain, hearing loss and tinnitus.   Eyes: Negative for blurred vision and double vision.  Respiratory: Negative for cough and hemoptysis.   Cardiovascular: Negative for chest pain, palpitations and leg swelling.  Gastrointestinal: Positive for abdominal pain, blood in stool and constipation.  Genitourinary: Negative for dysuria and urgency.  Musculoskeletal: Positive for back pain, joint pain and myalgias.  Skin: Negative for rash.  Neurological: Positive for weakness. Negative for speech change  and focal weakness.  Endo/Heme/Allergies: Does not bruise/bleed easily.  Psychiatric/Behavioral: Negative for hallucinations and suicidal ideas.    Physical Exam: Vital signs: Vitals:   01/17/17 0858 01/17/17 1445  BP: 117/64 (!) (P) 109/59  Pulse: (!) 104 (!) (P) 102  Resp: 18 (P) 18  Temp: (!) 100.4 F (38 C) (P) 97.8 F (36.6 C)   Last BM Date: 01/17/17  Physical Exam  Constitutional: She is oriented to person, place, and time and well-developed, well-nourished, and in no distress. No distress.  HENT:  Head: Normocephalic and atraumatic.  Eyes: EOM are normal. Pupils are equal, round, and reactive to light.  Neck: Normal range of motion. Neck supple.  Cardiovascular: Regular rhythm and normal heart sounds.  Tachycardia present.   Pulmonary/Chest: Effort normal and breath sounds normal.  Abdominal: Soft. Bowel sounds are normal.  Right upper quadrant and left lower quadrant tenderness to palpation. No peritoneal signs.  Musculoskeletal: Normal range of motion.  Neurological: She is alert and oriented to person, place, and time.  Skin: Skin is dry. No rash noted. No erythema.  Psychiatric: Mood, affect and judgment normal.  Rectal exam. Patient stated that she prefers not to have another rectal exam done as she already had rectal exam twice today  GI:  Lab Results:  Recent Labs  01/16/17 1038 01/16/17 2146 01/17/17 0400  WBC 9.0 9.8 9.5  HGB 7.5* 6.8* 6.3*  HCT 26.5* 23.6* 21.8*  PLT 341 304 287   BMET  Recent Labs  01/16/17 1038 01/17/17 0400  NA 138 131*  K 4.0 3.8  CL 105 100*  CO2 20* 24  GLUCOSE 64* 121*  BUN <5* 8  CREATININE 0.43* 0.52  CALCIUM 8.9 8.4*   LFT  Recent Labs  01/16/17 1038 01/17/17 0400  PROT 8.6* 7.0  ALBUMIN 3.1* 2.5*  AST 30 19  ALT 11* 9*  ALKPHOS 63 54  BILITOT 0.4 0.4  BILIDIR 0.1  --   IBILI 0.3  --    PT/INR  Recent Labs  01/16/17 1038  LABPROT 15.2  INR 1.19     Studies/Results: Dg Chest 2  View  Result Date: 01/17/2017 CLINICAL DATA:  Fever EXAM: CHEST  2 VIEW COMPARISON:  11/23/2011 FINDINGS: The heart size and mediastinal contours are within normal limits. Both lungs are clear. The visualized skeletal structures are unremarkable. IMPRESSION: No active cardiopulmonary disease. Electronically Signed   By: Ulyses Jarred M.D.   On: 01/17/2017 02:57   Dg Abd 1 View  Result Date: 01/16/2017 CLINICAL DATA:  Generalized abdominal pain.  Intermittent fever EXAM: ABDOMEN - 1 VIEW COMPARISON:  None. FINDINGS: No dilated to large or small bowel. Moderate volume stool in the colon. No pathologic calcification. No organomegaly. Severe degenerate changes the hips. IMPRESSION: No acute abdominal findings.  Moderate volume stool the colon Electronically Signed   By: Suzy Bouchard  M.D.   On: 01/16/2017 18:20   Ct Abdomen Pelvis W Contrast  Result Date: 01/17/2017 CLINICAL DATA:  51 year old female with fever and mid epigastric pain EXAM: CT ABDOMEN AND PELVIS WITH CONTRAST TECHNIQUE: Multidetector CT imaging of the abdomen and pelvis was performed using the standard protocol following bolus administration of intravenous contrast. CONTRAST:  100 mL Isovue-300 COMPARISON:  None. FINDINGS: Lower chest: Minimal dependent atelectasis. The lung bases are otherwise clear. Visualized cardiac structures are within normal limits for size. No pericardial effusion. Unremarkable visualized distal thoracic esophagus. Hepatobiliary: Normal hepatic contour and morphology. No discrete hepatic lesions. Normal appearance of the gallbladder. No intra or extrahepatic biliary ductal dilatation. Pancreas: Unremarkable. No pancreatic ductal dilatation or surrounding inflammatory changes. Spleen: Normal in size without focal abnormality. Adrenals/Urinary Tract: Adrenal glands are unremarkable. Kidneys are normal, without renal calculi, focal lesion, or hydronephrosis. Bladder is unremarkable. Stomach/Bowel: The stomach, small  bowel and majority of the colon are within normal limits. However, there is extensive abnormality in the region of the rectum with a large enhancing soft tissue mass measuring approximately 5.7 x 4.1 cm which appears to ex and into the perirectal fat in the left post row lateral (5 o'clock) position. The extra rectal extension measures approximately 4.3 x 5.3 cm. There is moderate inflammatory stranding in the perirectal fat. At least 11 mm low-attenuation peripherally enhancing collection in the perirectal fat may represent a small phlegmon/abscess. No definite adenopathy. Vascular/Lymphatic: Aortic atherosclerosis. No enlarged abdominal or pelvic lymph nodes. Reproductive: Uterus and bilateral adnexa are unremarkable. Other: No abdominal wall hernia or abnormality. No abdominopelvic ascites. Musculoskeletal: No acute fracture or aggressive appearing lytic or blastic osseous lesion. IMPRESSION: 1. Diffusely thickened and irregular rectum with what appears to be a lobular enhancing soft tissue mass extending into the perirectal fat at least 1 small (11 mm) low-attenuation region in the perirectal fat may represent a small abscess and thus evidence of micro perforation. Findings are most consistent with locally advanced rectal cancer. Severe proctitis with developing adjacent inflammatory phlegmon is also possible but less likely. 2. No definite metastatic adenopathy or distant metastatic disease. 3.  Aortic Atherosclerosis (ICD10-170.0). Electronically Signed   By: Jacqulynn Cadet M.D.   On: 01/17/2017 13:20    Impression/Plan: - Abnormal CT scan showing 5.7 x 4.1 cm rectal mass. - ?? Small perirectal abscess  - SIRS with  fever and tachycardia. Tmax 102 today - Anemia. Currently getting blood transfusion. Hemoglobin 6.3 - ?? Cirrhosis per problem list. CT scan showed normal-appearing liver. Normal LFTs. Normal platelet. Normal INR. - Alcohol use - Substance use with   Cocaine  Recommendations ------------------------- - Patient with Tmax of 102 today and ongoing tachycardia. CT scan showed large soft tissue mass in the rectum with possible small perirectal abscess.  - According to IR, there is not any fluid collection that can be drained by interventional radiology. - Case discussed with surgery attending.  - Patient will need colonoscopy and biopsy . Colonoscopy will also help to rule out metachronous lesions.  - Patient is currently getting blood transfusion. Recommend colonoscopy once she is afebrile and have  received antibiotics for a few days. - Recommend clear liquid diet for now. - GI will follow    LOS: 0 days   Otis Brace  MD, FACP 01/17/2017, 3:16 PM  Pager [phone] If no answer or after 5 PM call [phone]

## 2017-01-17 NOTE — Consult Note (Signed)
Reason for Consult:  Rectal mass Referring Physician: [name] is an 51 y.o. female.  HPI: History of weeks of constipation and abdominal pain.  Has not noticed any blood in stools.  CT scan done which showed a rectal mass consistent with cancer.  This correlates well with physical examination which has a large palpable rectal mass  Past Medical History:  Diagnosis Date  . Alcohol abuse   . Arthritis   . Bipolar 1 disorder ([place])   . Cirrhosis of liver ([place])   . Depression   . Hypertension     Past Surgical History:  Procedure Laterality Date  . MANDIBLE FRACTURE SURGERY      History reviewed. No pertinent family history.  Social History:  reports that she has been smoking Cigarettes.  She has a 20.50 pack-year smoking history. She has quit using smokeless tobacco. She reports that she drinks about 1.2 oz of alcohol per week . She reports that she uses drugs, including Marijuana and "Crack" cocaine.  Allergies:  Allergies  Allergen Reactions  . Penicillins Hives and Swelling    Has patient had a PCN reaction causing immediate rash, facial/tongue/throat swelling, SOB or lightheadedness with hypotension: Yes Has patient had a PCN reaction causing severe rash involving mucus membranes or skin necrosis: No Has patient had a PCN reaction that required hospitalization No Has patient had a PCN reaction occurring within the last 10 years: Yes If all of the above answers are "NO", then may proceed with Cephalosporin use.     Medications: I have reviewed the patient's current medications.  Results for orders placed or performed during the hospital encounter of 01/16/17 (from the past 48 hour(s))  Hepatic function panel     Status: Abnormal   Collection Time: 01/16/17 10:38 AM  Result Value Ref Range   Total Protein 8.6 (H) 6.5 - 8.1 g/dL   Albumin 3.1 (L) 3.5 - 5.0 g/dL   AST 30 15 - 41 U/L   ALT 11 (L) 14 - 54 U/L   Alkaline Phosphatase 63 38 - 126 U/L   Total  Bilirubin 0.4 0.3 - 1.2 mg/dL   Bilirubin, Direct 0.1 0.1 - 0.5 mg/dL   Indirect Bilirubin 0.3 0.3 - 0.9 mg/dL  Basic metabolic panel     Status: Abnormal   Collection Time: 01/16/17 10:38 AM  Result Value Ref Range   Sodium 138 135 - 145 mmol/L   Potassium 4.0 3.5 - 5.1 mmol/L   Chloride 105 101 - 111 mmol/L   CO2 20 (L) 22 - 32 mmol/L   Glucose, Bld 64 (L) 65 - 99 mg/dL   BUN <5 (L) 6 - 20 mg/dL   Creatinine, Ser 0.43 (L) 0.44 - 1.00 mg/dL   Calcium 8.9 8.9 - 10.3 mg/dL   GFR calc non Af Amer >60 >60 mL/min   GFR calc Af Amer >60 >60 mL/min    Comment: (NOTE) The eGFR has been calculated using the CKD EPI equation. This calculation has not been validated in all clinical situations. eGFR's persistently <60 mL/min signify possible Chronic Kidney Disease.    Anion gap 13 5 - 15  CBC with Differential     Status: Abnormal   Collection Time: 01/16/17 10:38 AM  Result Value Ref Range   WBC 9.0 4.0 - 10.5 K/uL   RBC 4.32 3.87 - 5.11 MIL/uL   Hemoglobin 7.5 (L) 12.0 - 15.0 g/dL   HCT 26.5 (L) 36.0 - 46.0 %   MCV 61.3 (L)  78.0 - 100.0 fL   MCH 17.4 (L) 26.0 - 34.0 pg   MCHC 28.3 (L) 30.0 - 36.0 g/dL   RDW 21.7 (H) 11.5 - 15.5 %   Platelets 341 150 - 400 K/uL   Neutrophils Relative % 63 %   Lymphocytes Relative 28 %   Monocytes Relative 7 %   Eosinophils Relative 2 %   Basophils Relative 0 %   Neutro Abs 5.7 1.7 - 7.7 K/uL   Lymphs Abs 2.5 0.7 - 4.0 K/uL   Monocytes Absolute 0.6 0.1 - 1.0 K/uL   Eosinophils Absolute 0.2 0.0 - 0.7 K/uL   Basophils Absolute 0.0 0.0 - 0.1 K/uL   RBC Morphology RARE NRBCs   Protime-INR     Status: None   Collection Time: 01/16/17 10:38 AM  Result Value Ref Range   Prothrombin Time 15.2 11.4 - 15.2 seconds   INR 1.19   Lipase, blood     Status: None   Collection Time: 01/16/17 10:38 AM  Result Value Ref Range   Lipase 16 11 - 51 U/L  Urinalysis, Routine w reflex microscopic     Status: Abnormal   Collection Time: 01/16/17 11:16 AM  Result  Value Ref Range   Color, Urine STRAW (A) YELLOW   APPearance CLEAR CLEAR   Specific Gravity, Urine 1.004 (L) 1.005 - 1.030   pH 5.0 5.0 - 8.0   Glucose, UA NEGATIVE NEGATIVE mg/dL   Hgb urine dipstick MODERATE (A) NEGATIVE   Bilirubin Urine NEGATIVE NEGATIVE   Ketones, ur NEGATIVE NEGATIVE mg/dL   Protein, ur NEGATIVE NEGATIVE mg/dL   Nitrite NEGATIVE NEGATIVE   Leukocytes, UA NEGATIVE NEGATIVE   RBC / HPF 0-5 0 - 5 RBC/hpf   WBC, UA 0-5 0 - 5 WBC/hpf   Bacteria, UA RARE (A) NONE SEEN   Squamous Epithelial / LPF 0-5 (A) NONE SEEN  Urine rapid drug screen (hosp performed)     Status: Abnormal   Collection Time: 01/16/17 11:16 AM  Result Value Ref Range   Opiates NONE DETECTED NONE DETECTED   Cocaine POSITIVE (A) NONE DETECTED   Benzodiazepines NONE DETECTED NONE DETECTED   Amphetamines NONE DETECTED NONE DETECTED   Tetrahydrocannabinol NONE DETECTED NONE DETECTED   Barbiturates NONE DETECTED NONE DETECTED    Comment:        DRUG SCREEN FOR MEDICAL PURPOSES ONLY.  IF CONFIRMATION IS NEEDED FOR ANY PURPOSE, NOTIFY LAB WITHIN 5 DAYS.        LOWEST DETECTABLE LIMITS FOR URINE DRUG SCREEN Drug Class       Cutoff (ng/mL) Amphetamine      1000 Barbiturate      200 Benzodiazepine   397 Tricyclics       673 Opiates          300 Cocaine          300 THC              50   Ammonia     Status: None   Collection Time: 01/16/17 11:16 AM  Result Value Ref Range   Ammonia 15 9 - 35 umol/L  I-Stat beta hCG blood, ED     Status: None   Collection Time: 01/16/17 11:28 AM  Result Value Ref Range   I-stat hCG, quantitative <5.0 <5 mIU/mL   Comment 3            Comment:   GEST. AGE      CONC.  (mIU/mL)   <=1 WEEK  5 - 50     2 WEEKS       50 - 500     3 WEEKS       100 - 10,000     4 WEEKS     1,000 - 30,000        FEMALE AND NON-PREGNANT FEMALE:     LESS THAN 5 mIU/mL   Ethanol     Status: Abnormal   Collection Time: 01/16/17 11:30 AM  Result Value Ref Range   Alcohol,  Ethyl (B) 235 (H) <5 mg/dL    Comment:        LOWEST DETECTABLE LIMIT FOR SERUM ALCOHOL IS 5 mg/dL FOR MEDICAL PURPOSES ONLY   POC occult blood, ED Provider will collect     Status: None   Collection Time: 01/16/17 11:48 AM  Result Value Ref Range   Fecal Occult Bld NEGATIVE NEGATIVE  CBG monitoring, ED     Status: None   Collection Time: 01/16/17  2:13 PM  Result Value Ref Range   Glucose-Capillary 95 65 - 99 mg/dL  MRSA PCR Screening     Status: Abnormal   Collection Time: 01/16/17  9:43 PM  Result Value Ref Range   MRSA by PCR POSITIVE (A) NEGATIVE    Comment:        The GeneXpert MRSA Assay (FDA approved for NASAL specimens only), is one component of a comprehensive MRSA colonization surveillance program. It is not intended to diagnose MRSA infection nor to guide or monitor treatment for MRSA infections. RESULT CALLED TO, READ BACK BY AND VERIFIED WITH: C ROSE,RN '@[id number]'  01/17/17 MKELLY,MLT   HIV antibody (Routine Testing)     Status: None   Collection Time: 01/16/17  9:46 PM  Result Value Ref Range   HIV Screen 4th Generation wRfx Non Reactive Non Reactive    Comment: (NOTE) Performed At: [facility] [place], [place] [id number] Lindon Romp MD [id number]   Troponin I (q 6hr x 3)     Status: None   Collection Time: 01/16/17  9:46 PM  Result Value Ref Range   Troponin I <0.03 <0.03 ng/mL  Iron and TIBC     Status: Abnormal   Collection Time: 01/16/17  9:46 PM  Result Value Ref Range   Iron 13 (L) 28 - 170 ug/dL   TIBC 494 (H) 250 - 450 ug/dL   Saturation Ratios 3 (L) 10.4 - 31.8 %   UIBC 481 ug/dL  Ferritin     Status: Abnormal   Collection Time: 01/16/17  9:46 PM  Result Value Ref Range   Ferritin 5 (L) 11 - 307 ng/mL  Lactate dehydrogenase     Status: None   Collection Time: 01/16/17  9:46 PM  Result Value Ref Range   LDH 156 98 - 192 U/L  Vitamin B12     Status: None   Collection Time: 01/16/17  9:46 PM  Result Value Ref  Range   Vitamin B-12 226 180 - 914 pg/mL    Comment: (NOTE) This assay is not validated for testing neonatal or myeloproliferative syndrome specimens for Vitamin B12 levels.   Reticulocytes     Status: None   Collection Time: 01/16/17  9:46 PM  Result Value Ref Range   Retic Ct Pct 1.7 0.4 - 3.1 %   RBC. 3.91 3.87 - 5.11 MIL/uL   Retic Count, Manual 66.5 19.0 - 186.0 K/uL  CBC     Status: Abnormal   Collection Time:  01/16/17  9:46 PM  Result Value Ref Range   WBC 9.8 4.0 - 10.5 K/uL   RBC 3.91 3.87 - 5.11 MIL/uL   Hemoglobin 6.8 (LL) 12.0 - 15.0 g/dL    Comment: REPEATED TO VERIFY CRITICAL RESULT CALLED TO, READ BACK BY AND VERIFIED WITH: ROSE C RN AT 2214 ON 03.16.2018 BY COCHRANE S    HCT 23.6 (L) 36.0 - 46.0 %   MCV 60.4 (L) 78.0 - 100.0 fL   MCH 17.4 (L) 26.0 - 34.0 pg   MCHC 28.8 (L) 30.0 - 36.0 g/dL   RDW 21.4 (H) 11.5 - 15.5 %   Platelets 304 150 - 400 K/uL  Type and screen [place]     Status: None (Preliminary result)   Collection Time: 01/16/17 10:53 PM  Result Value Ref Range   ABO/RH(D) O POS    Antibody Screen NEG    Sample Expiration 01/19/2017    Unit Number [id number]    Blood Component Type RBC LR PHER1    Unit division 00    Status of Unit ISSUED    Transfusion Status OK TO TRANSFUSE    Crossmatch Result Compatible    Unit Number [id number]    Blood Component Type RED CELLS,LR    Unit division 00    Status of Unit ALLOCATED    Transfusion Status OK TO TRANSFUSE    Crossmatch Result Compatible   Prepare RBC     Status: None   Collection Time: 01/16/17 10:53 PM  Result Value Ref Range   Order Confirmation ORDER PROCESSED BY BLOOD BANK   Comprehensive metabolic panel     Status: Abnormal   Collection Time: 01/17/17  4:00 AM  Result Value Ref Range   Sodium 131 (L) 135 - 145 mmol/L    Comment: DELTA CHECK NOTED   Potassium 3.8 3.5 - 5.1 mmol/L   Chloride 100 (L) 101 - 111 mmol/L   CO2 24 22 - 32 mmol/L   Glucose, Bld  121 (H) 65 - 99 mg/dL   BUN 8 6 - 20 mg/dL   Creatinine, Ser 0.52 0.44 - 1.00 mg/dL   Calcium 8.4 (L) 8.9 - 10.3 mg/dL   Total Protein 7.0 6.5 - 8.1 g/dL   Albumin 2.5 (L) 3.5 - 5.0 g/dL   AST 19 15 - 41 U/L   ALT 9 (L) 14 - 54 U/L   Alkaline Phosphatase 54 38 - 126 U/L   Total Bilirubin 0.4 0.3 - 1.2 mg/dL   GFR calc non Af Amer >60 >60 mL/min   GFR calc Af Amer >60 >60 mL/min    Comment: (NOTE) The eGFR has been calculated using the CKD EPI equation. This calculation has not been validated in all clinical situations. eGFR's persistently <60 mL/min signify possible Chronic Kidney Disease.    Anion gap 7 5 - 15  CBC     Status: Abnormal   Collection Time: 01/17/17  4:00 AM  Result Value Ref Range   WBC 9.5 4.0 - 10.5 K/uL   RBC 3.59 (L) 3.87 - 5.11 MIL/uL   Hemoglobin 6.3 (LL) 12.0 - 15.0 g/dL    Comment: REPEATED TO VERIFY CRITICAL VALUE NOTED.  VALUE IS CONSISTENT WITH PREVIOUSLY REPORTED AND CALLED VALUE.    HCT 21.8 (L) 36.0 - 46.0 %   MCV 60.7 (L) 78.0 - 100.0 fL   MCH 17.5 (L) 26.0 - 34.0 pg   MCHC 28.9 (L) 30.0 - 36.0 g/dL   RDW 21.3 (H)  11.5 - 15.5 %   Platelets 287 150 - 400 K/uL  Troponin I (q 6hr x 3)     Status: None   Collection Time: 01/17/17  4:00 AM  Result Value Ref Range   Troponin I <0.03 <0.03 ng/mL  Lactic acid, plasma     Status: None   Collection Time: 01/17/17  4:00 AM  Result Value Ref Range   Lactic Acid, Venous 1.5 0.5 - 1.9 mmol/L  Troponin I (q 6hr x 3)     Status: None   Collection Time: 01/17/17  8:47 AM  Result Value Ref Range   Troponin I <0.03 <0.03 ng/mL  Lactic acid, plasma     Status: None   Collection Time: 01/17/17  8:47 AM  Result Value Ref Range   Lactic Acid, Venous 1.0 0.5 - 1.9 mmol/L  Occult blood card to lab, stool     Status: Abnormal   Collection Time: 01/17/17 11:27 AM  Result Value Ref Range   Fecal Occult Bld POSITIVE (A) NEGATIVE    Dg Chest 2 View  Result Date: 01/17/2017 CLINICAL DATA:  Fever EXAM: CHEST   2 VIEW COMPARISON:  11/23/2011 FINDINGS: The heart size and mediastinal contours are within normal limits. Both lungs are clear. The visualized skeletal structures are unremarkable. IMPRESSION: No active cardiopulmonary disease. Electronically Signed   By: Sherry Baker M.D.   On: 01/17/2017 02:57   Dg Abd 1 View  Result Date: 01/16/2017 CLINICAL DATA:  Generalized abdominal pain.  Intermittent fever EXAM: ABDOMEN - 1 VIEW COMPARISON:  None. FINDINGS: No dilated to large or small bowel. Moderate volume stool in the colon. No pathologic calcification. No organomegaly. Severe degenerate changes the hips. IMPRESSION: No acute abdominal findings.  Moderate volume stool the colon Electronically Signed   By: Sherry Baker M.D.   On: 01/16/2017 18:20   Ct Abdomen Pelvis W Contrast  Result Date: 01/17/2017 CLINICAL DATA:  51 year old female with fever and mid epigastric pain EXAM: CT ABDOMEN AND PELVIS WITH CONTRAST TECHNIQUE: Multidetector CT imaging of the abdomen and pelvis was performed using the standard protocol following bolus administration of intravenous contrast. CONTRAST:  100 mL Isovue-300 COMPARISON:  None. FINDINGS: Lower chest: Minimal dependent atelectasis. The lung bases are otherwise clear. Visualized cardiac structures are within normal limits for size. No pericardial effusion. Unremarkable visualized distal thoracic esophagus. Hepatobiliary: Normal hepatic contour and morphology. No discrete hepatic lesions. Normal appearance of the gallbladder. No intra or extrahepatic biliary ductal dilatation. Pancreas: Unremarkable. No pancreatic ductal dilatation or surrounding inflammatory changes. Spleen: Normal in size without focal abnormality. Adrenals/Urinary Tract: Adrenal glands are unremarkable. Kidneys are normal, without renal calculi, focal lesion, or hydronephrosis. Bladder is unremarkable. Stomach/Bowel: The stomach, small bowel and majority of the colon are within normal limits. However,  there is extensive abnormality in the region of the rectum with a large enhancing soft tissue mass measuring approximately 5.7 x 4.1 cm which appears to ex and into the perirectal fat in the left post row lateral (5 o'clock) position. The extra rectal extension measures approximately 4.3 x 5.3 cm. There is moderate inflammatory stranding in the perirectal fat. At least 11 mm low-attenuation peripherally enhancing collection in the perirectal fat may represent a small phlegmon/abscess. No definite adenopathy. Vascular/Lymphatic: Aortic atherosclerosis. No enlarged abdominal or pelvic lymph nodes. Reproductive: Uterus and bilateral adnexa are unremarkable. Other: No abdominal wall hernia or abnormality. No abdominopelvic ascites. Musculoskeletal: No acute fracture or aggressive appearing lytic or blastic osseous lesion. IMPRESSION: 1. Diffusely  thickened and irregular rectum with what appears to be a lobular enhancing soft tissue mass extending into the perirectal fat at least 1 small (11 mm) low-attenuation region in the perirectal fat may represent a small abscess and thus evidence of micro perforation. Findings are most consistent with locally advanced rectal cancer. Severe proctitis with developing adjacent inflammatory phlegmon is also possible but less likely. 2. No definite metastatic adenopathy or distant metastatic disease. 3.  Aortic Atherosclerosis (ICD10-170.0). Electronically Signed   By: Sherry Baker M.D.   On: 01/17/2017 13:20    Review of Systems  Constitutional: Negative for chills and fever.  Gastrointestinal: Positive for abdominal pain, blood in stool and constipation.   Blood pressure 109/77, pulse (!) 103, temperature 99.3 F (37.4 C), temperature source Oral, resp. rate 18, height '5\' 3"'  (1.6 m), weight 51.8 kg (114 lb 4.8 oz), SpO2 96 %. Physical Exam  Vitals reviewed. Constitutional: She is oriented to person, place, and time. She appears well-developed and well-nourished.   HENT:  Head: Normocephalic and atraumatic.  Eyes: Pupils are equal, round, and reactive to light.  Neck: Normal range of motion. Neck supple.  Cardiovascular: Normal rate, regular rhythm and normal heart sounds.   Respiratory: Effort normal and breath sounds normal.  GI: Soft. She exhibits no distension. There is tenderness (mild lower abdominal tenderness.).  Genitourinary: Rectal exam shows mass (Mass internal abotu 5 cm internal.) and guaiac positive stool.  Musculoskeletal: Normal range of motion.  Neurological: She is alert and oriented to person, place, and time.  Skin: Skin is warm and dry.  Psychiatric: She has a normal mood and affect. Her behavior is normal. Judgment and thought content normal.    Assessment/Plan: Rectal mass in 51 year old alcoholic female with history of cirrhosis.  Would likely need rectal ultrasound for staging and a colonoscopy for biopsy and proximal surveillance.  GI has been called. May be candidate for neoadjuvant chemotherapy. She does not appear to be obstructed at this time.  Sherry Baker 01/17/2017, 3:40 PM

## 2017-01-17 NOTE — Progress Notes (Signed)
Initial Nutrition Assessment  DOCUMENTATION CODES:  Not applicable  INTERVENTION:  Ensure Enlive po TID, each supplement provides 350 kcal and 20 grams of protein  NUTRITION DIAGNOSIS:  Inadequate oral intake related to nausea, vomiting, Bloating as evidenced by per patient report  GOAL:  Patient will meet greater than or equal to 90% of their needs  MONITOR:  PO intake, Supplement acceptance, Labs, Weight trends  REASON FOR ASSESSMENT:  Consult Assessment of nutrition requirement/status  ASSESSMENT:  51 y/o female PMHx substance abuse (cocaine, tobacco, etoh), Bipolar disorder, PCM, SI. Presented with epigastric pain and anemia. Reports no BM in few weeks. Admitted for further investigation/workup  Pt reports that she has severe weakness and gastric pain that is associated with stool containing blood clots. She has had nausea after eating and has occasionally thrown up from it. Additionally states "I just feel bloated". Because of this, she has been eating softer foods such as tuna salad, hamburger/chicken help, chicken salad etc. At baseline, she only eats one meal a day and does not take any type of vitamin.    She does not know her UBW, but comments that people have told her she looks like she has weighed > 140 lbs roughly 5 years ago, but has actually gained weight in the past 9 months.   At this time, she is feeling a little better, but still reports a poor appetite. She has had Ensure in the past and liked it. Was agreeable to supplementation.   Physical Exam. She is thin, but do not detect discernible muscle/fat wasting.  Labs reviewed: Febrile, Hemo: <7,  Medications: Folate, thiamin, mvi, ppi, senna-doc sodium, miralax    Recent Labs Lab 01/16/17 1038 01/17/17 0400  NA 138 131*  K 4.0 3.8  CL 105 100*  CO2 20* 24  BUN <5* 8  CREATININE 0.43* 0.52  CALCIUM 8.9 8.4*  GLUCOSE 64* 121*   Diet Order:  Diet regular Room service appropriate? Yes; Fluid consistency:  Thin  Skin:  Reviewed, no issues  Last BM:  3/17  Height:  Ht Readings from Last 1 Encounters:  01/16/17 5\' 3"  (1.6 m)   Weight:  Wt Readings from Last 1 Encounters:  01/17/17 114 lb 4.8 oz (51.8 kg)   Wt Readings from Last 10 Encounters:  01/17/17 114 lb 4.8 oz (51.8 kg)  05/25/16 102 lb (46.3 kg)  02/11/14 102 lb 11.8 oz (46.6 kg)  08/01/12 149 lb (67.6 kg)  11/05/11 145 lb (65.8 kg)   Ideal Body Weight:  52.27 kg  BMI:  Body mass index is 20.25 kg/m.  Estimated Nutritional Needs:  Kcal:  1700-1900 (33-37 kcal/kg bw) Protein:  62-73g Pro (1.2-1.4 g/kg bw) Fluid:  >1.8 L (25 ml/kg bw)  EDUCATION NEEDS:  No education needs identified at this time  Burtis Junes RD, LDN, [facility] Pager: [phone] 01/17/2017 11:19 AM

## 2017-01-17 NOTE — Progress Notes (Signed)
Received a call from [place], RN about Pt with a temperature of 102 orally. Rectal temperature obtained and was 101.8. Pt had not yet received blood transfusion. I assessed patient. She was resting comfortably in bed. She states that she "always just gets hot sometimes. This has been going on for years". Skin warm to the touch. Heart tachycardic with regular rhythm. Lungs with mild expiratory wheezing throughout. O2 sat 95% on room air. Abdomen soft and non-distended with mild tenderness to palpation in the epigastric area. No signs of acute abdomen. Pt denies any other URI symptoms. UA obtained in ED is non-infectious.  -Blood cultures x 2 -2 view CXR -Urine culture added on to previous collection -Lactic acid  Will plan to start antibiotics if anything is abnormal.  Hyman Bible, MD

## 2017-01-17 NOTE — Progress Notes (Signed)
Noted that FOBT+. Reviewed CT abdomen which showed a diffusely thickened, irregular rectum with a soft tissue mass extending into the perirectal fat which may represent small abscess with micro-perforation. Findings are concerning for advanced rectal cancer vs. Severe proctitis. Added Cefepime and Flagyl for possible intaabdominal infection given fevers overnight. GI consult was placed. Discussed case with IR Dr. Laurence Ferrari who stated there was no drain-able collection of fluid. DRE performed with edge of firm mass palpated in the distal posterior region of the rectum. Patient tender on exam but not exquisitely so. No stool or blood on glove after exam. Surgery has also been consulted at the recommendation of GI and IR. Patient now afebrile and given HgB 6.3 this AM will proceed with transfusion of 1uPRBC.   Phill Myron, D.O. 01/17/2017, 2:30 PM PGY-2, [facility]

## 2017-01-17 NOTE — Progress Notes (Signed)
Dr. Brett Albino notified that pt had temp of 102 orally after giving Tylenol.  Rectal temp obtained and Dr. Brett Albino notified.  Hold blood for now.  Will continue to monitor.

## 2017-01-17 NOTE — Progress Notes (Signed)
Dr. Brett Albino notified that pt has elevated temperature of 99.6.  Was curious if she wanted me to treat elevated temperature before starting blood.

## 2017-01-18 LAB — BASIC METABOLIC PANEL
ANION GAP: 7 (ref 5–15)
BUN: 6 mg/dL (ref 6–20)
CO2: 22 mmol/L (ref 22–32)
Calcium: 8.1 mg/dL — ABNORMAL LOW (ref 8.9–10.3)
Chloride: 102 mmol/L (ref 101–111)
Creatinine, Ser: 0.48 mg/dL (ref 0.44–1.00)
GFR calc Af Amer: >60 mL/min (ref >60)
GLUCOSE: 102 mg/dL — AB (ref 65–99)
POTASSIUM: 3.6 mmol/L (ref 3.5–5.1)
Sodium: 131 mmol/L — ABNORMAL LOW (ref 135–145)

## 2017-01-18 LAB — URINE CULTURE

## 2017-01-18 LAB — CBC
HCT: 25.1 % — ABNORMAL LOW (ref 36.0–46.0)
HEMATOCRIT: 24.3 % — AB (ref 36.0–46.0)
HEMOGLOBIN: 7.3 g/dL — AB (ref 12.0–15.0)
Hemoglobin: 7.4 g/dL — ABNORMAL LOW (ref 12.0–15.0)
MCH: 19.1 pg — ABNORMAL LOW (ref 26.0–34.0)
MCH: 19.6 pg — ABNORMAL LOW (ref 26.0–34.0)
MCHC: 29.5 g/dL — AB (ref 30.0–36.0)
MCHC: 30 g/dL (ref 30.0–36.0)
MCV: 64.7 fL — AB (ref 78.0–100.0)
MCV: 65.1 fL — ABNORMAL LOW (ref 78.0–100.0)
PLATELETS: 265 10*3/uL (ref 150–400)
Platelets: 254 10*3/uL (ref 150–400)
RBC: 3.88 MIL/uL (ref 3.87–5.11)
RBC: 3.73 MIL/uL — ABNORMAL LOW (ref 3.87–5.11)
RDW: 25.1 % — AB (ref 11.5–15.5)
RDW: 25.6 % — ABNORMAL HIGH (ref 11.5–15.5)
WBC: 9.7 10*3/uL (ref 4.0–10.5)
WBC: 8.6 10*3/uL (ref 4.0–10.5)

## 2017-01-18 LAB — HAPTOGLOBIN: Haptoglobin: 280 mg/dL — ABNORMAL HIGH (ref 34–200)

## 2017-01-18 MED ORDER — OXYCODONE HCL 5 MG PO TABS
5.0000 mg | ORAL_TABLET | Freq: Once | ORAL | Status: AC
  Administered 2017-01-19: 5 mg via ORAL
  Filled 2017-01-18: qty 1

## 2017-01-18 NOTE — Progress Notes (Signed)
01/18/2017, 16:20,  Pt refused to allow the bed alarm to be placed on, stating "When I have to go, I cannot wait, and the commode is right by my bed." Bed alarm was not put on and pt was educated regarding risk of falls and injuries which could occur.

## 2017-01-18 NOTE — Progress Notes (Signed)
Family Medicine Teaching Service Daily Progress Note Intern Pager: [phone]  Patient name: Sherry Baker Medical record number: [id number] Date of birth: 1966/08/01 Age: 51 y.o. Gender: female  Primary Care Provider: No PCP Per Patient Consultants: GI, Surgery  Code Status: Full  Pt Overview and Major Events to Date:  3/16- Admitted to FMTS with epigastric pain and anemia 3/17- Hgb dropped to 6.63 > 1u pRBCs ordered; developed fever to 101.8 > infectious work-up; CT abdomen revealed rectal mass   Assessment and Plan: Sherry Baker is a 51 y.o. female presenting with epigastric abdominal pain and anemia. PMH is significant for substance abuse (alcohol, tobacco, cocaine), bipolar disorder, and protein-calorie malnutrition.  Rectal Mass: Findings on CT abdomen are concerning for rectal cancer and less likely to be proctitis. Palpable mass present on DRE. FOBT+. No evidence of obstruction at present.  -GI consulted, appreciate recs; planning for colonoscopy with biopsy when patient afebrile and after several days of antibiotics  -surgery consulted, appreciated recs   Fever: Fever curve appears to be down-trending at present. Suspected abdominal source. CT abdomen showed small soft tissue mass extending into the perirectal fat that may represent small abscess and evidence of microperforation. IR reports there is no drain-able fluid collection present.HIV negative. Urine culture with multiple species.  -Cefepime (3/17 >) and Flagyl (3/17>)  - Blood cultures pending - Holding on antibiotics because unsure what is the source or if this is a true infection - Could consider doing an ECHO to rule out endocarditis, given her polysubstance abuse hx (although Pt denies IV drug use). - HIV pending  Microcytic Anemia: HgB 7.4 s/p 1 uPRBC 3/17. FOBT+. Iron panel with iron deficiency (ferritin 5). LDH and bili normal, making hemolysis less likely. B12 nl. Reticulocytes normal instead of increased as you would  expect. - repeat CBC at 1700  - Holding on IV iron for now with possible active infection - Haptoglobin pending - Folate pending - Repeat FOBT ordered to rule out GI bleed.  - Multivitamin, folate, thiamine per CIWA protocol.  Epigastric Abdominal Pain: May be secondary to gastritis vs peptic ulcer disease, especially given her alcohol and tobacco use in combination with daily NSAID use. ACS unlikely with negative troponins. - Continue Protonix 40mg  daily - GI cocktail prn - Avoid NSAIDs -GI consulted   Severe constipation: Improved. Pt had large BM after enema. - Miralax bid and Senokot-S bid   Hyponatremia: Na dropped from 138 on admission to 131. 131 on repeat labs today. May be somewhat chronically hyponatremic due to [facility] use (has been low on previous lab results available).  - NS at 75 cc/hr   Polysubstance Abuse: Daily alcohol use of two 40oz beers and occasional cocaine use. CIWA score of 5>3>2 overnight. - CIWA protocol - Thiamine, folate, and multivitamin ordered - SW consult -avoid medications such as Tramadol that lower the seizure threshold   Tobacco Abuse: Smokes 6-7 cigarettes per day for "many years". Some wheezing on exam, but no diagnosed lung conditions. - Declines nicotine patch - Would benefit from outpatient PFTs - Albuterol prn  Arthritis: Pt notes bilateral hip pain. She was supposed to follow-up with an orthopedic surgeon but she missed the appointment. - PT/OT consult  Protein-Calorie Malnutrition: BMI 18. Likely malnutrition in the setting of polysubstance abuse. - Nutrition consult  Bipolar disorder: Noted in the chart, but Pt not on any medications. - Consider outpatient psychiatry referral  FEN/GI:  - Protonix as above - Miralax and Senokot-S for bowel regimen - Clear  liquid diet  - NS at maintenance rate for today Prophylaxis: SCDs in the setting of possible GI bleed  Disposition: Pending colonoscopy and biopsy of rectal mass    Subjective:  Complaining of abdominal pain this morning and just "not feeling great". Denies dizziness, lightheadedness, SOB and chest pain. Have small squirts of BMs without noticeable blood.   Objective: Temp:  [97.6 F (36.4 C)-101.3 F (38.5 C)] 97.6 F (36.4 C) (03/18 0544) Pulse Rate:  [102-104] 102 (03/18 0544) Resp:  [16-18] 17 (03/18 0544) BP: (104-122)/(59-77) 115/63 (03/18 0544) SpO2:  [95 %-97 %] 97 % (03/18 0544) Physical Exam: General: laying in bed, in NADI Cardiovascular: RRR, no murmurs Respiratory: Diffuse expiratory wheezing but loudest in the lung bases, normal work of breathing Gastrointestinal: +BS, +mild tenderness to palpation in the epigastric area, no distension, no rebound, no guarding. MSK: No edema, warm and well-perfused.   Laboratory:  Recent Labs Lab 01/16/17 2146 01/17/17 0400 01/17/17 1959 01/18/17 0529  WBC 9.8 9.5  --  9.7  HGB 6.8* 6.3* 7.4* 7.4*  HCT 23.6* 21.8* 24.9* 25.1*  PLT 304 287  --  265    Recent Labs Lab 01/16/17 1038 01/17/17 0400 01/18/17 0529  NA 138 131* 131*  K 4.0 3.8 3.6  CL 105 100* 102  CO2 20* 24 22  BUN <5* 8 6  CREATININE 0.43* 0.52 0.48  CALCIUM 8.9 8.4* 8.1*  PROT 8.6* 7.0  --   BILITOT 0.4 0.4  --   ALKPHOS 63 54  --   ALT 11* 9*  --   AST 30 19  --   GLUCOSE 64* 121* 102*   -Iron 13, TIBC 494, ferritin 5 -B12 nl -Retic 1.7 -LDH nl -Lactic acid 1.5 -UA- nl -UDS- +cocaine -Ethyl alcohol 235  Imaging/Diagnostic Tests: AXR- moderate stool  Sherry Bang, DO 01/18/2017, 6:58 AM PGY-2, [place] Intern pager: [phone], text pages welcome

## 2017-01-18 NOTE — Evaluation (Signed)
Occupational Therapy Evaluation Patient Details Name: Sherry Baker MRN: [id number] DOB: 1966-10-02 Today's Date: 01/18/2017    History of Present Illness Pt adm with abdominal pain, anemia and fever. CT revealed rectal mass. PMH - polysubstance abuse, bil hip arthritis, bipolar, HTN   Clinical Impression   Pt admitted with the above diagnoses and presents with below problem list. PTA pt was independent with ADLs. Pt is currently close to baseline with ADLs, mobility, and functional transfers. Educated on energy conservation strategies, fall prevention, issued AE for LB ADLs. Also discussed bed/chair level general strengthening exercises to promote increased activity toleration. No further OT needs identified at this time. OT signing off.    Follow Up Recommendations  No OT follow up    Equipment Recommendations  None recommended by OT;Other (comment) (discussed using plastic chair at home as shower seat)    Recommendations for Other Services       Precautions / Restrictions Precautions Precautions: None      Mobility Bed Mobility Overal bed mobility: Independent                Transfers Overall transfer level: Modified independent                    Balance Overall balance assessment: No apparent balance deficits (not formally assessed)                                          ADL Overall ADL's : At baseline;Modified independent                                       General ADL Comments: Pt completed toilet transfer, pericare, and grooming task standing at sink. Supervision for safety. Pt using external support of sink, wall, and grab bar. Reports she is at baseline with transfers/mobility. Educated on energy conservation strategies and provided handout. Also issued AE for LB ADLs and discussed using plastic chair in shower as shower seat and to put mat or towel underneath chair to increase its stability.      Vision          Perception     Praxis      Pertinent Vitals/Pain Pain Assessment: Faces Faces Pain Scale: Hurts little more Pain Location: B hips     Hand Dominance     Extremity/Trunk Assessment Upper Extremity Assessment Upper Extremity Assessment: Overall WFL for tasks assessed   Lower Extremity Assessment Lower Extremity Assessment: Overall WFL for tasks assessed       Communication Communication Communication: No difficulties   Cognition Arousal/Alertness: Awake/alert Behavior During Therapy: WFL for tasks assessed/performed Overall Cognitive Status: Within Functional Limits for tasks assessed                     General Comments       Exercises       Shoulder Instructions      Home Living Family/patient expects to be discharged to:: Private residence Living Arrangements: Non-relatives/Friends Available Help at Discharge: Friend(s)               Bathroom Shower/Tub: Tub/shower unit         Home Equipment: Environmental consultant - 2 wheels ((Pt received after hospitalization 2015. Thinks it's at home)  Prior Functioning/Environment Level of Independence: Independent        Comments: occasional tub transfer assist due to baseline B hip pain        OT Problem List:        OT Treatment/Interventions:      OT Goals(Current goals can be found in the care plan section)    OT Frequency:     Barriers to D/C:            Co-evaluation              End of Session    Activity Tolerance: Patient tolerated treatment well Patient left: in bed;with call bell/phone within reach  OT Visit Diagnosis: Unsteadiness on feet (R26.81)                ADL either performed or assessed with clinical judgement  Time: 1434-1456 OT Time Calculation (min): 22 min Charges:  OT General Charges $OT Visit: 1 Procedure OT Evaluation $OT Eval Low Complexity: 1 Procedure G-Codes:       Clover Mealy OTR/L Pager:  [phone]  01/18/2017, 3:11 PM

## 2017-01-18 NOTE — Care Management Note (Signed)
Case Management Note  Patient Details  Name: Sherry Baker MRN: [id number] Date of Birth: [birth year]  Subjective/Objective:        Admitted with epigastric pain and anemia, hx of of HTN, Depression, Bipolar, polysubstance abuse.  -  CT abdomen revealed rectal mass       Crystal Richardson Landry (Relative)     [phone]      PCP:  Action/Plan: GI and Surgical team following.......colonoscopy with biopsy pending. CM to f/u with disposition needs.  Expected Discharge Date:                  Expected Discharge Plan:  Home/Self Care  In-House Referral:  Clinical Social Work, substance abuse  Discharge planning Services  CM Consult  Post Acute Care Choice:    Choice offered to:     DME Arranged:    DME Agency:     HH Arranged:    [place] Agency:     Status of Service:  In process, will continue to follow  If discussed at Long Length of Stay Meetings, dates discussed:    Additional Comments:  Sharin Mons, RN 01/18/2017, 12:26 PM

## 2017-01-18 NOTE — Progress Notes (Signed)
[facility] Gastroenterology Progress Note  Sherry Baker 51 y.o. 01-14-1966  CC:  Rectal mass.   Subjective: Patient continues to have a fever. Tmax 101.3 last night. Also remains tachycardic. Continues to have lower abdominal pain.  ROS : Was edema abdominal pain. Negative for nausea and vomiting.   Objective: Vital signs in last 24 hours: Vitals:   01/18/17 0230 01/18/17 0544  BP:  115/63  Pulse:  (!) 102  Resp:  17  Temp: 99.9 F (37.7 C) 97.6 F (36.4 C)    Physical Exam:  Constitutional: She is oriented to person, place, and time and well-developed, well-nourished, and in no distress. No distress.  HENT:  Head: Normocephalic and atraumatic.  Eyes: EOM are normal. Pupils are equal, round, and reactive to light.  Neck: Normal range of motion. Neck supple.  Cardiovascular: Regular rhythm and normal heart sounds.  Tachycardia present.   Pulmonary/Chest: Effort normal and breath sounds normal.  Abdominal: Soft. Bowel sounds are normal.  Right upper quadrant and left lower quadrant tenderness to palpation. No peritoneal signs.  Musculoskeletal: Normal range of motion.  Neurological: She is alert and oriented to person, place, and time.  Psychiatric: Mood, affect and judgment normal.   Lab Results:  Recent Labs  01/17/17 0400 01/18/17 0529  NA 131* 131*  K 3.8 3.6  CL 100* 102  CO2 24 22  GLUCOSE 121* 102*  BUN 8 6  CREATININE 0.52 0.48  CALCIUM 8.4* 8.1*    Recent Labs  01/16/17 1038 01/17/17 0400  AST 30 19  ALT 11* 9*  ALKPHOS 63 54  BILITOT 0.4 0.4  PROT 8.6* 7.0  ALBUMIN 3.1* 2.5*    Recent Labs  01/16/17 1038  01/17/17 0400 01/17/17 1959 01/18/17 0529  WBC 9.0  < > 9.5  --  9.7  NEUTROABS 5.7  --   --   --   --   HGB 7.5*  < > 6.3* 7.4* 7.4*  HCT 26.5*  < > 21.8* 24.9* 25.1*  MCV 61.3*  < > 60.7*  --  64.7*  PLT 341  < > 287  --  265  < > = values in this interval not displayed.  Recent Labs  01/16/17 1038  LABPROT 15.2  INR 1.19       Assessment/Plan: - Abnormal CT scan showing 5.7 x 4.1 cm rectal mass With possible small perirectal abscess and possible microperforation. - SIRS with  fever and tachycardia. Tmax 101.3 last night. - Anemia. Hemoglobin stable - ?? Cirrhosis per problem list. CT scan showed normal-appearing liver. Normal LFTs. Normal platelet. Normal INR. - Alcohol use - Substance use with  Cocaine  Recommendations ------------------------- - Patient continues to have fever and tachycardia. CT scan concerning for small perirectal abscess with possible microperforation which could be the cause for patient's fever and tachycardia. - According to IR, there is not any fluid collection that can be drained by interventional radiology. - Surgery following  - Patient will need colonoscopy and biopsy . Colonoscopy will also help to rule out metachronous lesions.  - Possible colonoscopy on Tuesday once patient has  received antibiotics for a few days. - Recommend clear liquid diet for now. - GI will follow    Otis Brace MD, [place] 01/18/2017, 8:12 AM  Pager [phone]  If no answer or after 5 PM call [phone]

## 2017-01-19 ENCOUNTER — Inpatient Hospital Stay (HOSPITAL_COMMUNITY): Payer: Medicaid Other

## 2017-01-19 DIAGNOSIS — K629 Disease of anus and rectum, unspecified: Secondary | ICD-10-CM

## 2017-01-19 DIAGNOSIS — K6289 Other specified diseases of anus and rectum: Secondary | ICD-10-CM

## 2017-01-19 LAB — CBC
HCT: 23.8 % — ABNORMAL LOW (ref 36.0–46.0)
Hemoglobin: 7 g/dL — ABNORMAL LOW (ref 12.0–15.0)
MCH: 19.2 pg — ABNORMAL LOW (ref 26.0–34.0)
MCHC: 29.4 g/dL — ABNORMAL LOW (ref 30.0–36.0)
MCV: 65.4 fL — ABNORMAL LOW (ref 78.0–100.0)
PLATELETS: 250 10*3/uL (ref 150–400)
RBC: 3.64 MIL/uL — AB (ref 3.87–5.11)
RDW: 25.6 % — AB (ref 11.5–15.5)
WBC: 6.8 10*3/uL (ref 4.0–10.5)

## 2017-01-19 LAB — BASIC METABOLIC PANEL
Anion gap: 6 (ref 5–15)
CALCIUM: 8.1 mg/dL — AB (ref 8.9–10.3)
CO2: 23 mmol/L (ref 22–32)
CREATININE: 0.35 mg/dL — AB (ref 0.44–1.00)
Chloride: 105 mmol/L (ref 101–111)
Glucose, Bld: 85 mg/dL (ref 65–99)
Potassium: 3.5 mmol/L (ref 3.5–5.1)
SODIUM: 134 mmol/L — AB (ref 135–145)

## 2017-01-19 LAB — FOLATE RBC
Folate, Hemolysate: 359.3 ng/mL
Folate, RBC: 1371 ng/mL (ref >498)
Hematocrit: 26.2 % — ABNORMAL LOW (ref 34.0–46.6)

## 2017-01-19 LAB — HEMOGLOBIN AND HEMATOCRIT, BLOOD
HEMATOCRIT: 30.2 % — AB (ref 36.0–46.0)
Hemoglobin: 8.8 g/dL — ABNORMAL LOW (ref 12.0–15.0)

## 2017-01-19 LAB — PREPARE RBC (CROSSMATCH)

## 2017-01-19 MED ORDER — SODIUM CHLORIDE 0.9 % IV SOLN
Freq: Once | INTRAVENOUS | Status: AC
  Administered 2017-01-19: 13:00:00 via INTRAVENOUS

## 2017-01-19 MED ORDER — ACETAMINOPHEN 325 MG PO TABS
325.0000 mg | ORAL_TABLET | Freq: Three times a day (TID) | ORAL | Status: AC | PRN
  Administered 2017-01-19 – 2017-01-20 (×3): 325 mg via ORAL
  Filled 2017-01-19 (×3): qty 1

## 2017-01-19 NOTE — Progress Notes (Signed)
Family Medicine Teaching Service Daily Progress Note Intern Pager: [phone]  Patient name: Sherry Baker Medical record number: [id number] Date of birth: [birth year] Age: 51 y.o. Gender: female  Primary Care Provider: No PCP Per Patient Consultants: GI, Surgery  Code Status: Full  Pt Overview and Major Events to Date:  3/16- Admitted to FMTS with epigastric pain and anemia 3/17- Hgb dropped to 6.63 > 1u pRBCs ordered; developed fever to 101.8 > infectious work-up; CT abdomen revealed rectal mass   Assessment and Plan: Sherry Baker is a 51 y.o. female presenting with epigastric abdominal pain and anemia. PMH is significant for substance abuse (alcohol, tobacco, cocaine), bipolar disorder, and protein-calorie malnutrition.  Rectal Mass: Findings on CT abdomen most consistent with locally advanced rectal cancer.  -CT scan also concerning for small perirectal abscess with possible microperforation which could be the cause for patient's fever and tachycardia -GI consulted, appreciate recs; clear liquid diet, planning for colonoscopy with biopsy when patient afebrile and after several days of antibiotics. (likely Wed 3/21)  .  Per IR there is no fluid collection that can be drained by interventional radiology.  -surgery consulted: would need rectal ultrasound for staging and a colonoscopy for biopsy and proximal surveillance.  May be a candidate for neoadjuvant chemotherapy.    Fever, resolved. Patient has remained afebrile overnight.  Prior fevers likely 2/2 CT abdomen finding of small soft tissue mass extending into the perirectal fat that may represent small abscess and evidence of microperforation. As mentioned above, IR reports there is no drain-able fluid collection present. HIV negative. Urine culture with multiple species.  -Day 3 Abx - Cefepime (3/17 >) and Flagyl (3/17>) - Blood cultures NGx1day   Microcytic Anemia: HgB 7.4 s/p 1 uPRBC 3/17. FOBT+. Iron panel with iron deficiency (ferritin  5). LDH and bili normal, making hemolysis less likely. B12 nl. Reticulocytes normal.  Haptoglobin elevated to 280.  Transfusion threshold 7.0.  - repeat CBC this AM with Hgb of 7.0 from 7.4 yesterday.  Will transfuse 1 Unit and recheck H+H.  - Holding on IV iron for now with possible active infection.  Consider outpatient po iron.    - Folate pending   Epigastric Abdominal Pain: May be secondary to gastritis vs peptic ulcer disease, especially given her alcohol and tobacco use in combination with daily NSAID use. ACS unlikely with negative troponins. - Continue Protonix 40mg  daily - GI cocktail prn - Avoid NSAIDs -GI consulted   Severe constipation: Improved. Pt had large BM after enema. - Miralax bid and Senokot-S bid   Hyponatremia: Na dropped from 138 on admission to 131. 134 on repeat labs today. May be somewhat chronically hyponatremic due to [facility] use (has been low on previous lab results available).  - NS at 75 cc/hr   Polysubstance Abuse: Daily alcohol use of two 40oz beers and occasional cocaine use. CIWA score of 3>2>0>2 overnight, +for tremors and anxiety. - CIWA protocol  - Thiamine, folate, and multivitamin daily - SW consult -avoid medications such as Tramadol that lower the seizure threshold   Tobacco Abuse: Smokes 6-7 cigarettes per day for "many years". Some wheezing on exam, but no diagnosed lung conditions. - Declines nicotine patch - Would benefit from outpatient PFTs  - Albuterol prn  Arthritis: Pt notes bilateral hip pain. She was supposed to follow-up with an orthopedic surgeon but she missed the appointment. - PT/OT consult  Protein-Calorie Malnutrition: BMI 18. Likely malnutrition in the setting of polysubstance abuse. - Nutrition consult  Bipolar disorder: Noted  in the chart, but Pt not on any medications. - Consider outpatient psychiatry referral   FEN/GI:  - Protonix as above, Miralax and Senokot-S for bowel regimen, Clear liquid diet , IV  NS@75cc /hour Prophylaxis: SCDs in the setting of possible GI bleed  Disposition: Continue to treat with antibiotics, plan for colonoscopy and biopsy of rectal mass   Subjective:  Patient reports continued epigastric abdominal pain.  Otherwise with no complaints this AM.  Has been tolerating clear liquids.   Objective: Temp:  [98.1 F (36.7 C)-99.6 F (37.6 C)] 98.3 F (36.8 C) (03/19 1259) Pulse Rate:  [75-96] 75 (03/19 1259) Resp:  [12-18] 18 (03/19 1259) BP: (94-133)/(52-76) 105/63 (03/19 1259) SpO2:  [96 %-99 %] 99 % (03/19 1259)   Physical Exam: General: 51 y/o F laying in bed, in NAD  Cardiovascular: RRR, no MRG  Respiratory: CTA B.L with normal work of breathing Gastrointestinal: soft, ND, +mild tenderness to palpation in the epigastric area, +bs MSK: No edema, warm and well-perfused. Neuro: AAOx3, no focal deficits Psych: normal affect and mood  Laboratory:  Recent Labs Lab 01/18/17 0529 01/18/17 1659 01/19/17 0518  WBC 9.7 8.6 6.8  HGB 7.4* 7.3* 7.0*  HCT 25.1* 24.3* 23.8*  PLT 265 254 250    Recent Labs Lab 01/16/17 1038 01/17/17 0400 01/18/17 0529 01/19/17 0518  NA 138 131* 131* 134*  K 4.0 3.8 3.6 3.5  CL 105 100* 102 105  CO2 20* 24 22 23   BUN <5* 8 6 <5*  CREATININE 0.43* 0.52 0.48 0.35*  CALCIUM 8.9 8.4* 8.1* 8.1*  PROT 8.6* 7.0  --   --   BILITOT 0.4 0.4  --   --   ALKPHOS 63 54  --   --   ALT 11* 9*  --   --   AST 30 19  --   --   GLUCOSE 64* 121* 102* 85   -Iron 13, TIBC 494, ferritin 5 -B12 nl -Retic 1.7 -LDH nl -Lactic acid 1.5 -UA- nl -UDS- +cocaine -Ethyl alcohol 235  Imaging/Diagnostic Tests: AXR- moderate stool  Lovenia Kim, MD 01/19/2017, 1:02 PM PGY-1, [place] Intern pager: [phone], text pages welcome

## 2017-01-19 NOTE — Progress Notes (Signed)
Subjective: Generalized abdominal pain. Having non-bloody and loose stools, on antibiotics.  Objective: Vital signs in last 24 hours: Temp:  [98.1 F (36.7 C)-99.6 F (37.6 C)] 98.1 F (36.7 C) (03/19 0538) Pulse Rate:  [83-96] 83 (03/19 0538) Resp:  [12-18] 18 (03/19 0538) BP: (94-133)/(52-76) 94/52 (03/19 0538) SpO2:  [96 %-99 %] 96 % (03/19 0538) Weight change:  Last BM Date: 01/18/17  PE: GEN:  NAD ABD:  Mild distended and tympanic, mild tenderness, present bowel sounds, no peritonitis  Lab Results: CBC    Component Value Date/Time   WBC 6.8 01/19/2017 0518   RBC 3.64 (L) 01/19/2017 0518   HGB 7.0 (L) 01/19/2017 0518   HCT 23.8 (L) 01/19/2017 0518   PLT 250 01/19/2017 0518   MCV 65.4 (L) 01/19/2017 0518   MCH 19.2 (L) 01/19/2017 0518   MCHC 29.4 (L) 01/19/2017 0518   RDW 25.6 (H) 01/19/2017 0518   LYMPHSABS 2.5 01/16/2017 1038   MONOABS 0.6 01/16/2017 1038   EOSABS 0.2 01/16/2017 1038   BASOSABS 0.0 01/16/2017 1038   CMP     Component Value Date/Time   NA 134 (L) 01/19/2017 0518   K 3.5 01/19/2017 0518   CL 105 01/19/2017 0518   CO2 23 01/19/2017 0518   GLUCOSE 85 01/19/2017 0518   BUN <5 (L) 01/19/2017 0518   CREATININE 0.35 (L) 01/19/2017 0518   CALCIUM 8.1 (L) 01/19/2017 0518   PROT 7.0 01/17/2017 0400   ALBUMIN 2.5 (L) 01/17/2017 0400   AST 19 01/17/2017 0400   ALT 9 (L) 01/17/2017 0400   ALKPHOS 54 01/17/2017 0400   BILITOT 0.4 01/17/2017 0400   GFRNONAA >60 01/19/2017 0518   GFRAA >60 01/19/2017 0518   Assessment:  1.  Fevers, suspect from microperforation and small abscess, afebrile for the past 24+ hours. 2.  Abdominal pain, likely from rectal lesion with microperforation.  No interval worsening. 3.  Abnormal CT scan, rectal mass with suspected small microperforation, with tumor extension into perirectal fat. 4.  Anemia, likely from rectal mass.  Plan:  1.  Clear liquid diet only. 2.  Want 48 hours without fevers; if still afebrile  tomorrow, will initiate bowel preparation in anticipation of colonoscopy Wednesday 3/21. 3.  If patient ends up having a tumor in the rectum, as is suspected, the tumor has extended by CT scan into the perirectal fat (so, already a T4 lesion), and thus there is no advantage to be gained by also doing an endorectal ultrasound. 4.  Continue antibiotics. 5.  [facility] will follow.   Landry Dyke 01/19/2017, 9:49 AM   Pager [phone] If no answer or after 5 PM call [phone]

## 2017-01-19 NOTE — Progress Notes (Signed)
[facility] Progress Note     Subjective: Continues to have diarrhea- denies hematochezia. Reports "shooting" abdominal pain and rectal pain that is worse after BMs. Denies light-headedness.   Patient requests that her aunt, Elige Radon ([phone]) be allowed to receive updates on her condition by phone, stating that "she is basically the only family ive got".  Objective: Vital signs in last 24 hours: Temp:  [98.1 F (36.7 C)-99.6 F (37.6 C)] 98.1 F (36.7 C) (03/19 0538) Pulse Rate:  [83-96] 83 (03/19 0538) Resp:  [12-18] 18 (03/19 0538) BP: (94-133)/(52-76) 94/52 (03/19 0538) SpO2:  [96 %-99 %] 96 % (03/19 0538) Last BM Date: 01/18/17  Intake/Output from previous day: 03/18 0701 - 03/19 0700 In: -  Out: 1300 [Urine:1300] Intake/Output this shift: Total I/O In: 240 [P.O.:240] Out: -   PE: Gen:  Alert, NAD, pleasant and cooperative Card:  Regular rate and rhythm Abd: Soft, mild  TTP, bowel sounds present, no rebound tenderness or guarding. No masses/hernias.  Lab Results:   Recent Labs  01/18/17 1659 01/19/17 0518  WBC 8.6 6.8  HGB 7.3* 7.0*  HCT 24.3* 23.8*  PLT 254 250   BMET  Recent Labs  01/18/17 0529 01/19/17 0518  NA 131* 134*  K 3.6 3.5  CL 102 105  CO2 22 23  GLUCOSE 102* 85  BUN 6 <5*  CREATININE 0.48 0.35*  CALCIUM 8.1* 8.1*   PT/INR No results for input(s): LABPROT, INR in the last 72 hours. CMP     Component Value Date/Time   NA 134 (L) 01/19/2017 0518   K 3.5 01/19/2017 0518   CL 105 01/19/2017 0518   CO2 23 01/19/2017 0518   GLUCOSE 85 01/19/2017 0518   BUN <5 (L) 01/19/2017 0518   CREATININE 0.35 (L) 01/19/2017 0518   CALCIUM 8.1 (L) 01/19/2017 0518   PROT 7.0 01/17/2017 0400   ALBUMIN 2.5 (L) 01/17/2017 0400   AST 19 01/17/2017 0400   ALT 9 (L) 01/17/2017 0400   ALKPHOS 54 01/17/2017 0400   BILITOT 0.4 01/17/2017 0400   GFRNONAA >60 01/19/2017 0518   GFRAA >60 01/19/2017 0518   Lipase     Component  Value Date/Time   LIPASE 16 01/16/2017 1038       Studies/Results: Ct Abdomen Pelvis W Contrast  Result Date: 01/17/2017 CLINICAL DATA:  51 year old female with fever and mid epigastric pain EXAM: CT ABDOMEN AND PELVIS WITH CONTRAST TECHNIQUE: Multidetector CT imaging of the abdomen and pelvis was performed using the standard protocol following bolus administration of intravenous contrast. CONTRAST:  100 mL Isovue-300 COMPARISON:  None. FINDINGS: Lower chest: Minimal dependent atelectasis. The lung bases are otherwise clear. Visualized cardiac structures are within normal limits for size. No pericardial effusion. Unremarkable visualized distal thoracic esophagus. Hepatobiliary: Normal hepatic contour and morphology. No discrete hepatic lesions. Normal appearance of the gallbladder. No intra or extrahepatic biliary ductal dilatation. Pancreas: Unremarkable. No pancreatic ductal dilatation or surrounding inflammatory changes. Spleen: Normal in size without focal abnormality. Adrenals/Urinary Tract: Adrenal glands are unremarkable. Kidneys are normal, without renal calculi, focal lesion, or hydronephrosis. Bladder is unremarkable. Stomach/Bowel: The stomach, small bowel and majority of the colon are within normal limits. However, there is extensive abnormality in the region of the rectum with a large enhancing soft tissue mass measuring approximately 5.7 x 4.1 cm which appears to ex and into the perirectal fat in the left post row lateral (5 o'clock) position. The extra rectal extension measures approximately 4.3 x 5.3 cm. There  is moderate inflammatory stranding in the perirectal fat. At least 11 mm low-attenuation peripherally enhancing collection in the perirectal fat may represent a small phlegmon/abscess. No definite adenopathy. Vascular/Lymphatic: Aortic atherosclerosis. No enlarged abdominal or pelvic lymph nodes. Reproductive: Uterus and bilateral adnexa are unremarkable. Other: No abdominal wall  hernia or abnormality. No abdominopelvic ascites. Musculoskeletal: No acute fracture or aggressive appearing lytic or blastic osseous lesion. IMPRESSION: 1. Diffusely thickened and irregular rectum with what appears to be a lobular enhancing soft tissue mass extending into the perirectal fat at least 1 small (11 mm) low-attenuation region in the perirectal fat may represent a small abscess and thus evidence of micro perforation. Findings are most consistent with locally advanced rectal cancer. Severe proctitis with developing adjacent inflammatory phlegmon is also possible but less likely. 2. No definite metastatic adenopathy or distant metastatic disease. 3.  Aortic Atherosclerosis (ICD10-170.0). Electronically Signed   By: Jacqulynn Cadet M.D.   On: 01/17/2017 13:20    Anti-infectives: Anti-infectives    Start     Dose/Rate Route Frequency Ordered Stop   01/18/17 1000  ceFEPIme (MAXIPIME) 2 g in dextrose 5 % 50 mL IVPB     2 g 100 mL/hr over 30 Minutes Intravenous Every 12 hours 01/17/17 2145     01/17/17 1500  ceFEPIme (MAXIPIME) 2 g in dextrose 5 % 50 mL IVPB     2 g 100 mL/hr over 30 Minutes Intravenous  Once 01/17/17 1404 01/17/17 2354   01/17/17 1400  metroNIDAZOLE (FLAGYL) IVPB 500 mg     500 mg 100 mL/hr over 60 Minutes Intravenous Every 6 hours 01/17/17 1341       Assessment/Plan Abdominal pain  Rectal mass - appreciable on DRE. w/ suspected microperforation and small abscess (not amenable to IR drainage) on CT. No s/s obstruction Anemia - suspect 2/2 rectal mass, stable s/p transfusion; hgb 7.0.  FEN: clears ID: cefepime, flagyl  VTE: SCD's   Plan: colonoscopy  w/ biopsy tomorrow by GI for definitive diagnosis General surgery will follow     LOS: 2 days    Jill Alexanders , [facility] 01/19/2017, 10:55 AM Pager: [phone] Consults: [phone] Mon-Fri 7:00 am-4:30 pm Sat-Sun 7:00 am-11:30 am

## 2017-01-20 LAB — CBC
HEMATOCRIT: 31.4 % — AB (ref 36.0–46.0)
Hemoglobin: 9.4 g/dL — ABNORMAL LOW (ref 12.0–15.0)
MCH: 20.1 pg — AB (ref 26.0–34.0)
MCHC: 29.9 g/dL — ABNORMAL LOW (ref 30.0–36.0)
MCV: 67.1 fL — AB (ref 78.0–100.0)
Platelets: 278 10*3/uL (ref 150–400)
RBC: 4.68 MIL/uL (ref 3.87–5.11)
RDW: 25.8 % — AB (ref 11.5–15.5)
WBC: 6.5 10*3/uL (ref 4.0–10.5)

## 2017-01-20 LAB — BPAM RBC
Blood Product Expiration Date: 201804092359
Blood Product Expiration Date: 201804092359
ISSUE DATE / TIME: 201803191259
ISSUE DATE / TIME: 201803171457
Unit Type and Rh: 5100
Unit Type and Rh: 5100

## 2017-01-20 LAB — BASIC METABOLIC PANEL
ANION GAP: 10 (ref 5–15)
BUN: <5 mg/dL — ABNORMAL LOW (ref 6–20)
CALCIUM: 8.8 mg/dL — AB (ref 8.9–10.3)
CO2: 24 mmol/L (ref 22–32)
Chloride: 103 mmol/L (ref 101–111)
Creatinine, Ser: 0.48 mg/dL (ref 0.44–1.00)
GFR calc Af Amer: >60 mL/min (ref >60)
GFR calc non Af Amer: >60 mL/min (ref >60)
Glucose, Bld: 90 mg/dL (ref 65–99)
POTASSIUM: 3.8 mmol/L (ref 3.5–5.1)
Sodium: 137 mmol/L (ref 135–145)

## 2017-01-20 LAB — TYPE AND SCREEN
ABO/RH(D): O POS
Antibody Screen: NEGATIVE
Unit division: 0
Unit division: 0

## 2017-01-20 MED ORDER — BISACODYL 5 MG PO TBEC
5.0000 mg | DELAYED_RELEASE_TABLET | Freq: Once | ORAL | Status: AC
  Administered 2017-01-20: 5 mg via ORAL
  Filled 2017-01-20: qty 1

## 2017-01-20 MED ORDER — PEG-KCL-NACL-NASULF-NA ASC-C 100 G PO SOLR: 1.0000 | Freq: Once | ORAL | Status: DC

## 2017-01-20 MED ORDER — PEG-KCL-NACL-NASULF-NA ASC-C 100 G PO SOLR
0.5000 | Freq: Once | ORAL | Status: AC
  Administered 2017-01-20: 100 g via ORAL

## 2017-01-20 MED ORDER — BOOST / RESOURCE BREEZE PO LIQD
1.0000 | Freq: Three times a day (TID) | ORAL | Status: DC
  Administered 2017-01-20 – 2017-01-21 (×4): 1 via ORAL
  Administered 2017-01-22: 01:00:00 via ORAL
  Administered 2017-01-22 – 2017-01-23 (×5): 1 via ORAL

## 2017-01-20 MED ORDER — PEG-KCL-NACL-NASULF-NA ASC-C 100 G PO SOLR
0.5000 | Freq: Once | ORAL | Status: AC
  Administered 2017-01-20: 100 g via ORAL
  Filled 2017-01-20: qty 1

## 2017-01-20 NOTE — Progress Notes (Signed)
Pharmacy Antibiotic Note  Sherry Baker is a 51 y.o. female admitted on 01/16/2017 with epigastric pain and fevers. Pharmacy has been consulted for Cefepime dosing for intra-abdominal coverage.   Noted allergy to penicillins listed as hives and swelling (confirmed with patient). Discussed with MD and to continue with Cefepime however will monitor for any reaction.   Plan is for colonoscopy and biopsy tomorrow. Renal function stable, afebrile, WBC normal.   Plan: 1. Continue cefepime 2g IV every 12 hours 2. Pharmacy signing off but will follow peripherally for LOT and antibiotic de-escalation plans   Height: 5\' 3"  (160 cm) Weight: 114 lb 4.8 oz (51.8 kg) IBW/kg (Calculated) : 52.4  Temp (24hrs), Avg:98.2 F (36.8 C), Min:97.8 F (36.6 C), Max:98.4 F (36.9 C)   Recent Labs Lab 01/16/17 1038  01/17/17 0400 01/17/17 0847 01/18/17 0529 01/18/17 1659 01/19/17 0518 01/20/17 0551  WBC 9.0  < > 9.5  --  9.7 8.6 6.8 6.5  CREATININE 0.43*  --  0.52  --  0.48  --  0.35* 0.48  LATICACIDVEN  --   --  1.5 1.0  --   --   --   --   < > = values in this interval not displayed.  Estimated Creatinine Clearance: 69.6 mL/min (by C-G formula based on SCr of 0.48 mg/dL).    Allergies  Allergen Reactions  . Penicillins Hives and Swelling    Has patient had a PCN reaction causing immediate rash, facial/tongue/throat swelling, SOB or lightheadedness with hypotension: Yes Has patient had a PCN reaction causing severe rash involving mucus membranes or skin necrosis: No Has patient had a PCN reaction that required hospitalization No Has patient had a PCN reaction occurring within the last 10 years: Yes If all of the above answers are "NO", then may proceed with Cephalosporin use.     Antimicrobials this admission: Cefepime 3/17 >> Flagyl 3/17 >>   Dose adjustments this admission: n/a  Microbiology results: 3/16 MRSA PCR >> positive 3/17 BCx >>ngtd 3/17 UCx >> mult species, needs  re-collect  Thank you for allowing pharmacy to be a part of this patient's care.  Renold Genta, PharmD, [place] Pharmacist Main pharmacy - [phone] 01/20/2017 2:12 PM

## 2017-01-20 NOTE — Progress Notes (Signed)
Family Medicine Teaching Service Daily Progress Note Intern Pager: [phone]  Patient name: Sherry Baker Medical record number: [id number] Date of birth: 03/09/1966 Age: 51 y.o. Gender: female  Primary Care Provider: No PCP Per Patient Consultants: GI, Surgery  Code Status: Full   Pt Overview and Major Events to Date:  3/16- Admitted to FMTS with epigastric pain and anemia  3/17- Hgb dropped to 6.63 > 1u pRBCs ordered; developed fever to 101.8 > infectious work-up; CT abdomen revealed rectal mass  3/19 - Hgb 7.0 > 1U pRBCs ordered. Post H+H 8.8.   Assessment and Plan: Sherry Baker is a 51 y.o. female presenting with epigastric abdominal pain and anemia. PMH is significant for substance abuse (alcohol, tobacco, cocaine), bipolar disorder, and protein-calorie malnutrition.   Rectal Mass: Findings on CT abdomen most consistent with locally advanced rectal cancer. CT scan also concerning for small perirectal abscess with possible microperforation which could be the cause for patient's fever and tachycardia.  Afebrile for >24 hours, and on antibiotics.  -GI consulted, appreciate recs; clear liquid diet only, >48 h without fevers, planning for colonoscopy with biopsy on Wed 3/21.  -Surgery consulted, appreciate recs: obtain rectal ultrasound for staging, colonoscopy for biopsy and proximal surveillance.  May be a candidate for neoadjuvant chemotherapy.  Per GI note, if patient found to have tumor in rectum, on CT the tumor has already extended into perirectal fat (T4 lesion) and endorectal U/S for staging would be of no advantage.   Fever, resolved. Patient has remained afebrile overnight.  Prior fevers likely 2/2 CT abdomen finding of small soft tissue mass extending into the perirectal fat that may represent small abscess and evidence of microperforation. As mentioned above, IR reports there is no drain-able fluid collection present. HIV negative. Urine culture with multiple species.  -Day 4 Abx -  Cefepime (3/17 >) and Flagyl (3/17>) - Blood cultures NGx2days  Microcytic Anemia: HgB 7.4 s/p 1 uPRBC 3/17. FOBT+. Iron panel with iron deficiency (ferritin 5). LDH and bili normal, making hemolysis less likely. B12 nl and folate low (26.2). Reticulocytes normal.  Haptoglobin elevated to 280.  Transfusion threshold 7.0.  - CBC improved from 7.0 to 8.8 with 1U pRBCS on 3/19.  -CBC this AM stable at 9.4.  - Holding on IV iron for now with possible active infection.  Consider outpatient po iron.     Epigastric Abdominal Pain, improving: May be secondary to gastritis vs peptic ulcer disease, especially given her alcohol and tobacco use in combination with daily NSAID use. ACS unlikely with negative troponins. - Continue Protonix 40mg  daily - GI cocktail prn - Avoid NSAIDs -GI consulted   Severe constipation: Improved. Pt had large BM after enema. - Miralax bid and Senokot-S bid   Hyponatremia: Na dropped from 138 on admission to 131. 137 on repeat labs today. May be somewhat chronically hyponatremic due to [facility] use (has been low on previous lab results available).  - continue NS at 75 cc/hr   Polysubstance Abuse: Daily alcohol use of two 40oz beers and occasional cocaine use. CIWA score of 2>1>0 overnight, +for nausea and anxiety. - continue CIWA protocol  - Thiamine, folate, and multivitamin daily - SW consult -avoid medications such as Tramadol that lower the seizure threshold   Tobacco Abuse: Smokes 6-7 cigarettes per day for "many years". Some wheezing on exam, but no diagnosed lung conditions. - Declines nicotine patch - Would benefit from outpatient PFTs  - Albuterol prn  Arthritis: Pt notes bilateral hip pain. She was  supposed to follow-up with an orthopedic surgeon but she missed the appointment. - PT/OT consult - rec no PT/OT follow up   Protein-Calorie Malnutrition: BMI 18. Likely malnutrition in the setting of polysubstance abuse. - Nutrition consult  Bipolar  disorder: Noted in the chart, but Pt not on any medications. - Consider outpatient psychiatry referral   FEN/GI:  - Protonix as above, Miralax and Senokot-S for bowel regimen, Clear liquid diet , IV NS@75cc /hour Prophylaxis: SCDs in the setting of possible GI bleed  Disposition: Continue to treat with antibiotics and monitor for fevers.  Plan for colonoscopy and biopsy of rectal mass tomorrow.    Subjective:  Patient reports abdominal/epigastric pain has improved.  Denies nausea, vomiting.  Is tolerating clear liquid diet.  No concerns at this time.   Objective: Temp:  [98.2 F (36.8 C)-98.5 F (36.9 C)] 98.4 F (36.9 C) (03/20 0518) Pulse Rate:  [70-80] 76 (03/20 0518) Resp:  [18-20] 20 (03/20 0518) BP: (103-129)/(60-77) 121/60 (03/20 0518) SpO2:  [98 %-100 %] 99 % (03/20 0518)   Physical Exam: General: 51 y/o F sitting in hospital bed eating breakfast, in NAD  Cardiovascular: RRR, no MRG  Respiratory: CTA B.L with normal work of breathing Gastrointestinal: soft, ND, +mild tenderness to palpation in the epigastric area, +bs  MSK: No edema, warm and well-perfused.  Neuro: AAOx3, no focal deficits Psych: normal affect and mood  Laboratory:  Recent Labs Lab 01/18/17 1659 01/19/17 0518 01/19/17 1835 01/20/17 0551  WBC 8.6 6.8  --  6.5  HGB 7.3* 7.0* 8.8* 9.4*  HCT 24.3* 23.8* 30.2* 31.4*  PLT 254 250  --  278    Recent Labs Lab 01/16/17 1038 01/17/17 0400 01/18/17 0529 01/19/17 0518 01/20/17 0551  NA 138 131* 131* 134* 137  K 4.0 3.8 3.6 3.5 3.8  CL 105 100* 102 105 103  CO2 20* 24 22 23 24   BUN <5* 8 6 <5* <5*  CREATININE 0.43* 0.52 0.48 0.35* 0.48  CALCIUM 8.9 8.4* 8.1* 8.1* 8.8*  PROT 8.6* 7.0  --   --   --   BILITOT 0.4 0.4  --   --   --   ALKPHOS 63 54  --   --   --   ALT 11* 9*  --   --   --   AST 30 19  --   --   --   GLUCOSE 64* 121* 102* 85 90   -Iron 13, TIBC 494, ferritin 5 -B12 nl -Retic 1.7 -LDH nl -Lactic acid 1.5 -UA- nl -UDS-  +cocaine -Ethyl alcohol 235  Imaging/Diagnostic Tests: AXR- moderate stool  Lovenia Kim, MD 01/20/2017, 12:18 PM PGY-1, [place] Intern pager: [phone], text pages welcome

## 2017-01-20 NOTE — Progress Notes (Signed)
Subjective: Having stools. Abdominal pain slowly improving.  Objective: Vital signs in last 24 hours: Temp:  [98.2 F (36.8 C)-98.5 F (36.9 C)] 98.4 F (36.9 C) (03/20 0518) Pulse Rate:  [70-80] 76 (03/20 0518) Resp:  [18-20] 20 (03/20 0518) BP: (103-129)/(60-77) 121/60 (03/20 0518) SpO2:  [98 %-100 %] 99 % (03/20 0518) Weight change:  Last BM Date: 01/19/17  PE: GEN:  Thin, older-appearing than stated age, NAD ABD:  Soft, mild generalized tenderness without peritonitis.  Lab Results: CBC    Component Value Date/Time   WBC 6.5 01/20/2017 0551   RBC 4.68 01/20/2017 0551   HGB 9.4 (L) 01/20/2017 0551   HCT 31.4 (L) 01/20/2017 0551   HCT 26.2 (L) 01/16/2017 2146   PLT 278 01/20/2017 0551   MCV 67.1 (L) 01/20/2017 0551   MCH 20.1 (L) 01/20/2017 0551   MCHC 29.9 (L) 01/20/2017 0551   RDW 25.8 (H) 01/20/2017 0551   LYMPHSABS 2.5 01/16/2017 1038   MONOABS 0.6 01/16/2017 1038   EOSABS 0.2 01/16/2017 1038   BASOSABS 0.0 01/16/2017 1038   CMP     Component Value Date/Time   NA 137 01/20/2017 0551   K 3.8 01/20/2017 0551   CL 103 01/20/2017 0551   CO2 24 01/20/2017 0551   GLUCOSE 90 01/20/2017 0551   BUN <5 (L) 01/20/2017 0551   CREATININE 0.48 01/20/2017 0551   CALCIUM 8.8 (L) 01/20/2017 0551   PROT 7.0 01/17/2017 0400   ALBUMIN 2.5 (L) 01/17/2017 0400   AST 19 01/17/2017 0400   ALT 9 (L) 01/17/2017 0400   ALKPHOS 54 01/17/2017 0400   BILITOT 0.4 01/17/2017 0400   GFRNONAA >60 01/20/2017 0551   GFRAA >60 01/20/2017 0551   Assessment:  1.  Fevers, suspect from microperforation and small abscess, afebrile for the past 48+ hours, no leukocytosis. 2.  Abdominal pain, likely from rectal lesion with microperforation.  No interval worsening. 3.  Abnormal CT scan, rectal mass with suspected small microperforation, with tumor extension into perirectal fat. 4.  Anemia, likely from rectal mass.  Plan:  1.  Continue clear liquids. 2.  Continue antibiotics. 3.  Plan  for colonoscopy tomorrow. 4.  Next step in management pending colonoscopy findings.   Landry Dyke 01/20/2017, 12:39 PM   Pager [phone] If no answer or after 5 PM call [phone]

## 2017-01-20 NOTE — Care Management Note (Signed)
Case Management Note  Patient Details  Name: [name] MRN: [id number] Date of Birth: 26-Dec-1965  Subjective/Objective:    Epigastric pain, fever                Action/Plan: Discharge Planning: NCM spoke to pt and she does not have any insurance coverage at this time. Reports Financial Counselor did meet with her this admission. Will provide pt with [place] letter. Explained [place] program with $3 copay and use once per year. Will arrange follow up appt with [place]. Reports her Aunt will take her to MD appts. Will continue to follow for dc needs.     Expected Discharge Date:                  Expected Discharge Plan:  Home/Self Care  In-House Referral:  Clinical Social Work  Discharge planning Services  CM Consult  Post Acute Care Choice:  NA Choice offered to:  NA  DME Arranged:  N/A DME Agency:  NA  HH Arranged:  NA HH Agency:     Status of Service:  In process, will continue to follow  If discussed at Long Length of Stay Meetings, dates discussed:    Additional Comments:  Erenest Rasher, RN 01/20/2017, 5:31 PM

## 2017-01-21 ENCOUNTER — Inpatient Hospital Stay (HOSPITAL_COMMUNITY): Payer: Medicaid Other | Admitting: Certified Registered Nurse Anesthetist

## 2017-01-21 ENCOUNTER — Encounter (HOSPITAL_COMMUNITY)
Admission: EM | Disposition: A | Discharge status: Home / Self Care | Payer: Self-pay | Source: Home / Self Care | Attending: Family Medicine

## 2017-01-21 ENCOUNTER — Encounter (HOSPITAL_COMMUNITY): Payer: Self-pay | Admitting: *Deleted

## 2017-01-21 DIAGNOSIS — C20 Malignant neoplasm of rectum: Secondary | ICD-10-CM

## 2017-01-21 DIAGNOSIS — R1013 Epigastric pain: Secondary | ICD-10-CM

## 2017-01-21 DIAGNOSIS — C801 Malignant (primary) neoplasm, unspecified: Secondary | ICD-10-CM

## 2017-01-21 HISTORY — PX: COLONOSCOPY WITH PROPOFOL: SHX5780

## 2017-01-21 HISTORY — DX: Malignant neoplasm of rectum: C20

## 2017-01-21 HISTORY — DX: Malignant (primary) neoplasm, unspecified: C80.1

## 2017-01-21 LAB — BASIC METABOLIC PANEL
ANION GAP: 11 (ref 5–15)
BUN: <5 mg/dL — ABNORMAL LOW (ref 6–20)
CHLORIDE: 107 mmol/L (ref 101–111)
CO2: 19 mmol/L — AB (ref 22–32)
Calcium: 8.7 mg/dL — ABNORMAL LOW (ref 8.9–10.3)
Creatinine, Ser: 0.4 mg/dL — ABNORMAL LOW (ref 0.44–1.00)
GFR calc non Af Amer: >60 mL/min (ref >60)
Glucose, Bld: 87 mg/dL (ref 65–99)
POTASSIUM: 3.8 mmol/L (ref 3.5–5.1)
Sodium: 137 mmol/L (ref 135–145)

## 2017-01-21 LAB — CBC
HEMATOCRIT: 31 % — AB (ref 36.0–46.0)
HEMOGLOBIN: 9.2 g/dL — AB (ref 12.0–15.0)
MCH: 20 pg — ABNORMAL LOW (ref 26.0–34.0)
MCHC: 29.7 g/dL — ABNORMAL LOW (ref 30.0–36.0)
MCV: 67.5 fL — ABNORMAL LOW (ref 78.0–100.0)
Platelets: 266 10*3/uL (ref 150–400)
RBC: 4.59 MIL/uL (ref 3.87–5.11)
RDW: 26.5 % — ABNORMAL HIGH (ref 11.5–15.5)
WBC: 5.3 10*3/uL (ref 4.0–10.5)

## 2017-01-21 SURGERY — COLONOSCOPY WITH PROPOFOL
Anesthesia: Monitor Anesthesia Care | Laterality: Left

## 2017-01-21 MED ORDER — LACTATED RINGERS IV SOLN
INTRAVENOUS | Status: DC
  Administered 2017-01-21: 11:00:00 via INTRAVENOUS

## 2017-01-21 MED ORDER — FENTANYL CITRATE (PF) 100 MCG/2ML IJ SOLN
INTRAMUSCULAR | Status: DC | PRN
  Administered 2017-01-21: 25 ug via INTRAVENOUS

## 2017-01-21 MED ORDER — PROPOFOL 500 MG/50ML IV EMUL
INTRAVENOUS | Status: DC | PRN
  Administered 2017-01-21: 75 ug/kg/min via INTRAVENOUS
  Administered 2017-01-21: 150 ug/kg/min via INTRAVENOUS

## 2017-01-21 MED ORDER — SODIUM CHLORIDE 0.9 % IV SOLN
INTRAVENOUS | Status: DC | PRN
  Administered 2017-01-21 (×2): via INTRAVENOUS

## 2017-01-21 MED ORDER — ACETAMINOPHEN 325 MG PO TABS
325.0000 mg | ORAL_TABLET | Freq: Four times a day (QID) | ORAL | Status: DC | PRN
  Administered 2017-01-21: 325 mg via ORAL
  Filled 2017-01-21: qty 1

## 2017-01-21 MED ORDER — SODIUM CHLORIDE 0.9 % IV SOLN
INTRAVENOUS | Status: DC
  Administered 2017-01-21: 09:00:00 via INTRAVENOUS

## 2017-01-21 MED ORDER — LIDOCAINE HCL (CARDIAC) 20 MG/ML IV SOLN
INTRAVENOUS | Status: DC | PRN
  Administered 2017-01-21: 40 mg via INTRATRACHEAL

## 2017-01-21 MED ORDER — PROPOFOL 10 MG/ML IV BOLUS
INTRAVENOUS | Status: DC | PRN
  Administered 2017-01-21 (×2): 20 mg via INTRAVENOUS
  Administered 2017-01-21: 10 mg via INTRAVENOUS

## 2017-01-21 SURGICAL SUPPLY — 21 items

## 2017-01-21 NOTE — Progress Notes (Signed)
Family Medicine Teaching Service Daily Progress Note Intern Pager: [phone]  Patient name: Sherry Baker Medical record number: [id number] Date of birth: 1966-06-19 Age: 51 y.o. Gender: female  Primary Care Provider: No PCP Per Patient Consultants: GI, Surgery  Code Status: Full   Pt Overview and Major Events to Date:  3/16- Admitted to FMTS with epigastric pain and anemia  3/17- Hgb dropped to 6.63 > 1u pRBCs ordered; developed fever to 101.8 > infectious work-up; CT abdomen revealed rectal mass  3/19 - Hgb 7.0 > 1U pRBCs ordered. Post H+H 8.8.   Assessment and Plan: Sherry Baker is a 51 y.o. female presenting with epigastric abdominal pain and anemia. PMH is significant for substance abuse (alcohol, tobacco, cocaine), bipolar disorder, and protein-calorie malnutrition.    Rectal Mass: Findings on CT abdomen most consistent with locally advanced rectal cancer. VS have been stable since admission.  Afebrile for >48 hours, and on antibiotics.   -GI consulted, appreciate recs; clear liquid diet only, >48 h without fevers,  planning for colonoscopy with biopsy today.   Finished her bowel prep before midnight without any problems.   -Colonoscopy >> malignant tumor in the rectum.  One 7-mm polyp in sigmoid colon, removed.  -Consult oncology.  Further plan pending surgery/oncology recs.    Fever, resolved. Patient has remained afebrile overnight.  Prior fevers likely 2/2 CT abdomen finding of small soft tissue mass extending into the perirectal fat that may represent small abscess and evidence of microperforation. As mentioned above, IR reports there is no drain-able fluid collection present. HIV negative. Urine culture with multiple species.  -Day 5 Abx - Cefepime (3/17 >) and Flagyl (3/17>) - Blood cultures NGx2days  Microcytic Anemia: HgB 7.4 s/p 1 uPRBC 3/17. FOBT+. Iron panel with iron deficiency (ferritin 5). LDH and bili normal, making hemolysis less likely. B12 nl and folate low (26.2).  Reticulocytes normal.  Haptoglobin elevated to 280.  Transfusion threshold 7.0.  - CBC improved from 7.0 to 8.8 with 1U pRBCS on 3/19.  -CBC this AM stable at 9.4.  - Holding on IV iron for now with possible active infection.  Consider outpatient po iron.     Epigastric Abdominal Pain, improved: May be secondary to gastritis vs peptic ulcer disease, especially given her alcohol and tobacco use in combination with daily NSAID use. ACS unlikely with negative troponins. - Continue Protonix 40mg  daily - GI cocktail prn  Severe constipation: Improved. Pt had large BM after enema. - Miralax bid and Senokot-S bid   Hyponatremia: Na dropped from 138 on admission to 131. 137 on repeat labs today. May be somewhat chronically hyponatremic due to [facility] use (has been low on previous lab results available).  - continue NS at 20 cc/hr for pre-procedure  Polysubstance Abuse: Daily alcohol use of two 40oz beers and occasional cocaine use. CIWA score of 1>0 overnight, +for nausea and anxiety. - continue CIWA protocol  - Thiamine, folate, and multivitamin daily - SW consult -avoid medications such as Tramadol that lower the seizure threshold   Tobacco Abuse: Smokes 6-7 cigarettes per day for "many years". Some wheezing on exam, but no diagnosed lung conditions. - Declines nicotine patch - Would benefit from outpatient PFTs  - Albuterol prn  Arthritis: Pt notes bilateral hip pain. She was supposed to follow-up with an orthopedic surgeon but she missed the appointment. - PT/OT consult - rec no PT/OT follow up   Protein-Calorie Malnutrition: BMI 18. Likely malnutrition in the setting of polysubstance abuse. - Nutrition consult  Bipolar disorder: Noted in the chart, but Pt not on any medications. - Consider outpatient psychiatry referral   FEN/GI:  - Protonix as above, Miralax and Senokot-S for bowel regimen, Clear liquid diet , IV NS@75cc /hour Prophylaxis: SCDs in the setting of possible GI  bleed  Disposition: Plan pending onc and surgery recs.    Subjective:  Patient reports abdominal/epigastric pain has improved.  Denies nausea, vomiting.  Has finished her go-lytely.  Will go for colonoscopy this morning.     Objective: Temp:  [97.5 F (36.4 C)-98.1 F (36.7 C)] 98.1 F (36.7 C) (03/21 1254) Pulse Rate:  [49-81] 73 (03/21 1533) Resp:  [13-23] 16 (03/21 1317) BP: (106-161)/(62-91) 147/88 (03/21 1533) SpO2:  [94 %-100 %] 98 % (03/21 1533) Weight:  [114 lb 4.8 oz (51.8 kg)] 114 lb 4.8 oz (51.8 kg) (03/21 1030)   Physical Exam: General: 51 y/o F sitting in hospital bed, NAD  Cardiovascular: RRR, no MRG  Respiratory: CTA B/L with normal work of breathing  Gastrointestinal: soft, ND, +bs  MSK: No edema, warm and well-perfused Neuro: AAOx3, no focal deficits Psych: normal affect and mood  Laboratory:  Recent Labs Lab 01/19/17 0518 01/19/17 1835 01/20/17 0551 01/21/17 0954  WBC 6.8  --  6.5 5.3  HGB 7.0* 8.8* 9.4* 9.2*  HCT 23.8* 30.2* 31.4* 31.0*  PLT 250  --  278 266    Recent Labs Lab 01/16/17 1038 01/17/17 0400  01/19/17 0518 01/20/17 0551 01/21/17 0954  NA 138 131*  < > 134* 137 137  K 4.0 3.8  < > 3.5 3.8 3.8  CL 105 100*  < > 105 103 107  CO2 20* 24  < > 23 24 19*  BUN <5* 8  < > <5* <5* <5*  CREATININE 0.43* 0.52  < > 0.35* 0.48 0.40*  CALCIUM 8.9 8.4*  < > 8.1* 8.8* 8.7*  PROT 8.6* 7.0  --   --   --   --   BILITOT 0.4 0.4  --   --   --   --   ALKPHOS 63 54  --   --   --   --   ALT 11* 9*  --   --   --   --   AST 30 19  --   --   --   --   GLUCOSE 64* 121*  < > 85 90 87  < > = values in this interval not displayed. -Iron 13, TIBC 494, ferritin 5 -B12 nl -Retic 1.7 -LDH nl -Lactic acid 1.5 -UA- nl -UDS- +cocaine -Ethyl alcohol 235  Imaging/Diagnostic Tests: AXR- moderate stool   Sherry Kim, MD 01/21/2017, 4:22 PM PGY-1, [place] Intern pager: [phone], text pages welcome

## 2017-01-21 NOTE — Progress Notes (Signed)
Pt. Finished all bowel prep before midnight without problems. Her stools are very watery and light brown in color at this time.

## 2017-01-21 NOTE — Progress Notes (Signed)
[facility] Progress Note  Sherry Baker 51 y.o. Apr 05, 1966  CC:  Rectal mass.   Subjective: Patient continues to have a fever. Tmax 101.3 last night. Also remains tachycardic. Continues to have lower abdominal pain.  ROS : Was edema abdominal pain. Negative for nausea and vomiting.   Objective: Vital signs in last 24 hours: Vitals:   01/21/17 0529 01/21/17 1030  BP: 125/64 (!) 147/88  Pulse: 81 69  Resp: 18 13  Temp: 97.5 F (36.4 C) 97.6 F (36.4 C)    Physical Exam:  Constitutional: She is oriented to person, place, and time and well-developed, well-nourished, and in no distress. No distress.  HENT:  Head: Normocephalic and atraumatic.  Eyes: EOM are normal. Pupils are equal, round, and reactive to light.  Neck: Normal range of motion. Neck supple.  Cardiovascular: Regular rhythm and normal heart sounds.  Tachycardia present.   Pulmonary/Chest: Effort normal and breath sounds normal.  Abdominal: Soft. Bowel sounds are normal. Nontender. Nondistended. No peritoneal signs.  Neurological: She is alert and oriented to person, place, and time.  Psychiatric: Mood, affect and judgment normal.   Lab Results:  Recent Labs  01/20/17 0551 01/21/17 0954  NA 137 137  K 3.8 3.8  CL 103 107  CO2 24 19*  GLUCOSE 90 87  BUN <5* <5*  CREATININE 0.48 0.40*  CALCIUM 8.8* 8.7*   No results for input(s): AST, ALT, ALKPHOS, BILITOT, PROT, ALBUMIN in the last 72 hours.  Recent Labs  01/20/17 0551 01/21/17 0954  WBC 6.5 5.3  HGB 9.4* 9.2*  HCT 31.4* 31.0*  MCV 67.1* 67.5*  PLT 278 266   No results for input(s): LABPROT, INR in the last 72 hours.    Assessment/Plan: - Abnormal CT scan showing 5.7 x 4.1 cm rectal mass With possible small perirectal abscess and possible microperforation. - SIRS with  fever and tachycardia On admission. Afebrile at this time. - Anemia. Hemoglobin stable - ?? Cirrhosis per problem list. CT scan showed normal-appearing liver.  Normal LFTs. Normal platelet. Normal INR. - Alcohol use - Substance use with  Cocaine  Recommendations ------------------------- - Colonoscopy today. Risk benefits alternatives discussed with the patient. Verbalized understanding. - Continue antibiotics. - Further plan based on colonoscopy findings.    Otis Brace MD, [place] 01/21/2017, 11:13 AM  Pager [phone]  If no answer or after 5 PM call [phone]

## 2017-01-21 NOTE — Brief Op Note (Signed)
01/16/2017 - 01/21/2017  12:13 PM  PATIENT:  Sherry Baker  51 y.o. female  PRE-OPERATIVE DIAGNOSIS:  Rectal mass (on CT), change in bowel habits, abdominal pain  POST-OPERATIVE DIAGNOSIS:  Rectal mass  PROCEDURE:  Procedure(s): COLONOSCOPY WITH PROPOFOL (Left)  SURGEON:  Surgeon(s) and Role:    * Otis Brace, MD - Primary  Findings/recommendations ------------------------------------- - Patient with large 5 cm friable and ulcerated rectal mass starting at  5 cm from  Hale verge - Another 7 mm polyp in the sigmoid colon but no other mass lesion or large polyps in remainder of the colon. - Follow biopsy results. - clear Liquid diet for now  - Surgery  following. - Oncology consultation for further management. - GI will follow.

## 2017-01-21 NOTE — Op Note (Signed)
[facility] Patient Name: Sherry Baker Procedure Date : 01/21/2017 MRN: [id number] Attending MD: Otis Brace , MD Date of Birth: Oct 06, 1966 CSN: [id number] Age: 51 Admit Type: Inpatient Procedure:                Colonoscopy Indications:              Hematochezia, Rectal mass Providers:                Otis Brace, MD, Burtis Junes, RN, Cherylynn Ridges,                            Technician, Tressia Miners, CRNA Referring MD:              Medicines:                Sedation Administered by an Anesthesia Professional Complications:            No immediate complications. Estimated Blood Loss:     Estimated blood loss was minimal. Procedure:                Pre-Anesthesia Assessment:                           - Prior to the procedure, a History and Physical                            was performed, and patient medications and                            allergies were reviewed. The patient's tolerance of                            previous anesthesia was also reviewed. The risks                            and benefits of the procedure and the sedation                            options and risks were discussed with the patient.                            All questions were answered, and informed consent                            was obtained. Prior Anticoagulants: The patient has                            taken no previous anticoagulant or antiplatelet                            agents. ASA Grade Assessment: III - A patient with                            severe systemic disease. After reviewing the risks  and benefits, the patient was deemed in                            satisfactory condition to undergo the procedure.                           After obtaining informed consent, the colonoscope                            was passed under direct vision. Throughout the                            procedure, the patient's blood pressure, pulse, and                            oxygen saturations were monitored continuously. The                            EC-[id number] ([id number]) scope was introduced through                            the anus and advanced to the the cecum, identified                            by appendiceal orifice and ileocecal valve. The                            colonoscopy was technically difficult and complex                            due to the patient's discomfort during the                            procedure. The patient tolerated the procedure. The                            quality of the bowel preparation was good. The                            ileocecal valve, appendiceal orifice, and rectum                            were photographed. The quality of the bowel                            preparation was good. Scope In: 11:32:55 AM Scope Out: 94:50:38 AM Scope Withdrawal Time: 0 hours 17 minutes 39 seconds  Total Procedure Duration: 0 hours 23 minutes 18 seconds  Findings:      The perianal exam findings include skin tags.      The digital rectal exam revealed a hard rectal mass. The mass was       non-circumferential and located predominantly at the posterior bowel       wall.      A 7 mm polyp was found in  the sigmoid colon. The polyp was semi-sessile.       The polyp was removed with a cold snare. Resection and retrieval were       complete.      An infiltrative, polypoid and ulcerated non-obstructing large mass was       found in the rectum. The mass was partially circumferential (involving       two-thirds of the lumen circumference). The mass measured five cm in       length. Oozing was present. Biopsies were taken with a cold forceps for       histology. retroflexion was not performed.      A few small and large-mouthed diverticula were found in the sigmoid       colon. Impression:               - Perianal skin tags found on perianal exam.                           - Rectal mass.                            - One 7 mm polyp in the sigmoid colon, removed with                            a cold snare. Resected and retrieved.                           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                           - Diverticulosis in the sigmoid colon. Moderate Sedation:      Moderate (conscious) sedation was personally administered by an       anesthesia professional. The following parameters were monitored: oxygen       saturation, heart rate, blood pressure, and response to care. Recommendation:           - Return patient to hospital ward for ongoing care.                           - Clear liquid diet.                           - Continue present medications.                           - Await pathology results.                           - Repeat colonoscopy in 1 year for surveillance.                           - Return to my office in 2 months. Procedure Code(s):        --- Professional ---                           [phone], Colonoscopy, flexible; with removal of  tumor(s), polyp(s), or other lesion(s) by snare                            technique                           45380, 59, Colonoscopy, flexible; with biopsy,                            single or multiple Diagnosis Code(s):        --- Professional ---                           K62.89, Other specified diseases of anus and rectum                           D12.5, Benign neoplasm of sigmoid colon                           C20, Malignant neoplasm of rectum                           K64.4, Residual hemorrhoidal skin tags                           K92.1, Melena (includes Hematochezia)                           K57.30, Diverticulosis of large intestine without                            perforation or abscess without bleeding CPT copyright 2016 American Medical Association. All rights reserved. The codes documented in this report are preliminary and upon coder review may  be revised to meet current  compliance requirements. Otis Brace, MD Otis Brace, MD 01/21/2017 12:07:52 PM Number of Addenda: 0

## 2017-01-21 NOTE — Anesthesia Preprocedure Evaluation (Addendum)
Anesthesia Evaluation  Patient identified by MRN, date of birth, ID band Patient awake    Reviewed: Allergy & Precautions, NPO status , Patient's Chart, lab work & pertinent test results  Airway Mallampati: I       Dental no notable dental hx.    Pulmonary Current Smoker,    Pulmonary exam normal        Cardiovascular hypertension, Normal cardiovascular exam     Neuro/Psych PSYCHIATRIC DISORDERS Depression Bipolar Disorder negative neurological ROS     GI/Hepatic Bowel prep,(+)     substance abuse  alcohol use and cocaine use,   Endo/Other  negative endocrine ROS  Renal/GU negative Renal ROS     Musculoskeletal   Abdominal Normal abdominal exam  (+)   Peds  Hematology   Anesthesia Other Findings   Reproductive/Obstetrics                            Anesthesia Physical Anesthesia Plan  ASA: II  Anesthesia Plan: MAC   Post-op Pain Management:    Induction: Intravenous  Airway Management Planned: Natural Airway, Simple Face Mask and Nasal Cannula  Additional Equipment:   Intra-op Plan:   Post-operative Plan:   Informed Consent: I have reviewed the patients History and Physical, chart, labs and discussed the procedure including the risks, benefits and alternatives for the proposed anesthesia with the patient or authorized representative who has indicated his/her understanding and acceptance.     Plan Discussed with: CRNA and Surgeon  Anesthesia Plan Comments:         Anesthesia Quick Evaluation

## 2017-01-21 NOTE — Anesthesia Procedure Notes (Signed)
Procedure Name: MAC Performed by: Casimer Russett LEFFEW Pre-anesthesia Checklist: Patient identified, Emergency Drugs available, Suction available, Timeout performed and Patient being monitored Patient Re-evaluated:Patient Re-evaluated prior to inductionOxygen Delivery Method: Simple face mask Placement Confirmation: positive ETCO2       

## 2017-01-21 NOTE — Anesthesia Postprocedure Evaluation (Addendum)
Anesthesia Post Note  Patient: Sherry Baker  Procedure(s) Performed: Procedure(s) (LRB): COLONOSCOPY WITH PROPOFOL (Left)  Patient location during evaluation: PACU Anesthesia Type: MAC Level of consciousness: awake Pain management: pain level controlled Vital Signs Assessment: post-procedure vital signs reviewed and stable Respiratory status: spontaneous breathing Cardiovascular status: stable Postop Assessment: no signs of nausea or vomiting Anesthetic complications: no        Last Vitals:  Vitals:   01/21/17 1030 01/21/17 1203  BP: (!) 147/88 124/62  Pulse: 69 73  Resp: 13 17  Temp: 36.4 C 36.4 C    Last Pain:  Vitals:   01/21/17 1203  TempSrc: Oral  PainSc:    Pain Goal: Patients Stated Pain Goal: 0 (01/20/17 0524)               Nozomi Mettler JR,JOHN Mateo Flow

## 2017-01-21 NOTE — Transfer of Care (Signed)
Immediate Anesthesia Transfer of Care Note  Patient: Sherry Baker  Procedure(s) Performed: Procedure(s): COLONOSCOPY WITH PROPOFOL (Left)  Patient Location: Endoscopy Unit  Anesthesia Type:MAC  Level of Consciousness: awake, alert , oriented, patient cooperative and responds to stimulation  Airway & Oxygen Therapy: Patient Spontanous Breathing  Post-op Assessment: Report given to RN, Post -op Vital signs reviewed and stable and Patient moving all extremities X 4  Post vital signs: Reviewed and stable  Last Vitals:  Vitals:   01/21/17 1030 01/21/17 1203  BP: (!) 147/88 124/62  Pulse: 69 73  Resp: 13 17  Temp: 36.4 C 36.4 C    Last Pain:  Vitals:   01/21/17 1203  TempSrc: Oral  PainSc:       Patients Stated Pain Goal: 0 (54/62/70 3500)  Complications: No apparent anesthesia complications

## 2017-01-22 LAB — BASIC METABOLIC PANEL
Anion gap: 9 (ref 5–15)
CALCIUM: 8.2 mg/dL — AB (ref 8.9–10.3)
CO2: 22 mmol/L (ref 22–32)
CREATININE: 0.39 mg/dL — AB (ref 0.44–1.00)
Chloride: 106 mmol/L (ref 101–111)
GFR calc Af Amer: >60 mL/min (ref >60)
GLUCOSE: 91 mg/dL (ref 65–99)
Potassium: 3.3 mmol/L — ABNORMAL LOW (ref 3.5–5.1)
Sodium: 137 mmol/L (ref 135–145)

## 2017-01-22 LAB — CBC
HCT: 28.8 % — ABNORMAL LOW (ref 36.0–46.0)
Hemoglobin: 8.4 g/dL — ABNORMAL LOW (ref 12.0–15.0)
MCH: 19.7 pg — ABNORMAL LOW (ref 26.0–34.0)
MCHC: 29.2 g/dL — AB (ref 30.0–36.0)
MCV: 67.4 fL — ABNORMAL LOW (ref 78.0–100.0)
PLATELETS: 260 10*3/uL (ref 150–400)
RBC: 4.27 MIL/uL (ref 3.87–5.11)
RDW: 26.7 % — AB (ref 11.5–15.5)
WBC: 4.2 10*3/uL (ref 4.0–10.5)

## 2017-01-22 LAB — CULTURE, BLOOD (ROUTINE X 2)
CULTURE: NO GROWTH
Culture: NO GROWTH

## 2017-01-22 MED ORDER — POTASSIUM CHLORIDE CRYS ER 20 MEQ PO TBCR
40.0000 meq | EXTENDED_RELEASE_TABLET | Freq: Two times a day (BID) | ORAL | Status: AC
  Administered 2017-01-22 (×2): 40 meq via ORAL
  Filled 2017-01-22 (×2): qty 2

## 2017-01-22 NOTE — Progress Notes (Signed)
Nutrition Follow-up  DOCUMENTATION CODES:   Not applicable  INTERVENTION:   -Continue Boost Breeze po TID, each supplement provides 250 kcal and 9 grams of protein -RD will continue for diet advancement and adjust supplement regimen as appropriate  NUTRITION DIAGNOSIS:   Inadequate oral intake related to nausea, vomiting (Bloating) as evidenced by per patient/family report.  Progressing  GOAL:   Patient will meet greater than or equal to 90% of their needs  Progressing  MONITOR:   PO intake, Supplement acceptance, Labs, Weight trends  REASON FOR ASSESSMENT:   Consult Assessment of nutrition requirement/status  ASSESSMENT:   Sherry Baker is a 51 y.o. female presenting with epigastric abdominal pain and anemia. PMH is significant for substance abuse (alcohol, tobacco, cocaine), bipolar disorder, and protein-calorie malnutrition.  Pt with perirectal mass/abscess. Pt underwent colonoscopy on 01/20/17, which revealed rectal mass, polyps in colon (removed), malignant tumor in rectum, and diverticulosis in sigmoid colon. Current awaiting patholgy results and oncology evaluation.   Pt out of room at time of visit. Staff report pt is trying to stay positive in light of potential diagnosis.  Pt on a clear liquid diet (meal completion 100%). Noted Boost Breeze supplement ordered 01/20/17. Clear liquid meal tray at bedside has been untouched. Per surgery notes, potential to advance diet.   Medications reviewed and include vitamin B-1, MVI, and K-Dur.  Labs reviewed: K: 3.3 (on PO supplementation).  Diet Order:  Diet clear liquid Room service appropriate? Yes; Fluid consistency: Thin  Skin:  Reviewed, no issues  Last BM:  01/21/17  Height:   Ht Readings from Last 1 Encounters:  01/21/17 5\' 3"  (1.6 m)    Weight:   Wt Readings from Last 1 Encounters:  01/21/17 114 lb 4.8 oz (51.8 kg)    Ideal Body Weight:  52.27 kg  BMI:  Body mass index is 20.25 kg/m.  Estimated  Nutritional Needs:   Kcal:  1700-1900 (33-37 kcal/kg bw)  Protein:  62-73g Pro (1.2-1.4 g/kg bw)  Fluid:  >1.8 L (25 ml/kg bw)  EDUCATION NEEDS:   No education needs identified at this time  Kirstie Larsen A. Jimmye Norman, RD, LDN, CDE Pager: [phone] After hours Pager: [phone]

## 2017-01-22 NOTE — Progress Notes (Signed)
Family Medicine Teaching Service Daily Progress Note Intern Pager: [phone]  Patient name: Sherry Baker Medical record number: [id number] Date of birth: [birth year] Age: 51 y.o. Gender: female  Primary Care Provider: No PCP Per Patient Consultants: GI, Surgery  Code Status: Full   Pt Overview and Major Events to Date:  3/16- Admitted to [facility] with epigastric pain and anemia  3/17- Hgb dropped to 6.63 > 1u pRBCs ordered; developed fever to 101.8 > infectious work-up; CT abdomen revealed rectal mass  3/19 - Hgb 7.0 > 1U pRBCs ordered. Post H+H 8.8.   Assessment and Plan: Sherry Baker is a 51 y.o. female presenting with epigastric abdominal pain and anemia. PMH is significant for substance abuse (alcohol, tobacco, cocaine), bipolar disorder, and protein-calorie malnutrition.    Rectal Mass: Findings on CT abdomen most consistent with locally advanced rectal cancer. VS have been stable since admission.  Afebrile for >48 hours, and on antibiotics.   -GI and surgery following -s/p colonoscopy with biopsy 3/21>> malignant tumor in the rectum.  One 7-mm polyp in sigmoid colon, removed.  -Oncology consulted this AM, appreciate recs.  Dr. Jana Hakim to see.  -Oncology and surgery to coordinate patient's management (chemoradiation first then surgery or vice versa).  Awaiting biopsy results.  Clear liquid diet for now pending plans. GI will follow as outpatient.   Fever, resolved. Patient has remained afebrile -Day 6 Abx - Cefepime (3/17 >) and Flagyl (3/17>) -Will discuss with surgery if need to continue antibiotics  - Blood cultures NGx2days  Hypokalemia.  K+ 3.3 this AM -Repleted with 40 kdur x2.  -Recheck BMET in morning   Microcytic Anemia: HgB 7.4 s/p 1 uPRBC 3/17. FOBT+. Iron panel with iron deficiency (ferritin 5). LDH and bili normal, making hemolysis less likely. B12 nl and folate low (26.2). Reticulocytes normal.  Haptoglobin elevated to 280.  Transfusion threshold 7.0.  - CBC improved  from 7.0 to 8.8 with 1U pRBCS on 3/19.  -CBC this AM stable at 8.4.  - Holding on IV iron for now with possible active infection.  Consider outpatient po iron.     Epigastric Abdominal Pain, improved: May be secondary to gastritis vs peptic ulcer disease, especially given her alcohol and tobacco use in combination with daily NSAID use. ACS unlikely with negative troponins. - Continue Protonix 40mg  daily - GI cocktail prn  Severe constipation: Improved. Pt had large BM after enema. - Miralax bid and Senokot-S bid   Hyponatremia: Na dropped from 138 on admission to 131. 137 on repeat labs today. May be somewhat chronically hyponatremic due to [facility] use (has been low on previous lab results available).  - continue NS at 20 cc/hr for pre-procedure  Polysubstance Abuse: Daily alcohol use of two 40oz beers and occasional cocaine use. CIWA score of 1>0>0 overnight  - Will discontinue CIWA protocol as scores have remained low  -CSW for substance abuse  -avoid medications such as Tramadol that lower the seizure threshold   Tobacco Abuse: Smokes 6-7 cigarettes per day for "many years". Some wheezing on exam, but no diagnosed lung conditions. - Declines nicotine patch - Would benefit from outpatient PFTs  - Albuterol prn  Arthritis: Pt notes bilateral hip pain. She was supposed to follow-up with an orthopedic surgeon but she missed the appointment. - PT/OT consult - rec no PT/OT follow up   Protein-Calorie Malnutrition: BMI 18. Likely malnutrition in the setting of polysubstance abuse. - Nutrition consult  Bipolar disorder: Noted in the chart, but Pt not on any medications. -  Consider outpatient psychiatry referral   FEN/GI:  - Protonix as above, Miralax and Senokot-S for bowel regimen, Clear liquid diet , IV NS@75cc /hour Prophylaxis: SCDs in the setting of possible GI bleed  Disposition: Pending onc and surgery plan. Patient on clears in case planning for surgery soon.     Subjective:  Patient expresses that she is upset with the diagnosis however notes she has good support,  has been praying and finding ways to deal with it.  She denies nausea, vomiting, diarrhea.  Has been having some mild crampy abdominal pain but otherwise feels well.    Objective: Temp:  [98 F (36.7 C)-98.1 F (36.7 C)] 98.1 F (36.7 C) (03/22 0443) Pulse Rate:  [61-95] 71 (03/22 0443) Resp:  [18] 18 (03/22 0443) BP: (127-147)/(66-117) 133/76 (03/22 0443) SpO2:  [95 %-100 %] 98 % (03/22 0443)   Physical Exam: General: 51 y/o F sitting in hospital bed eating soup, in NAD Cardiovascular: RRR, no MRG  Respiratory: CTA B/L, no wheezes noted, comfortable normal work of breathing  Gastrointestinal: soft, NT, ND, +bs  MSK: No edema or tenderness  Neuro: AAOx3, no focal deficits Psych: normal affect and mood  Laboratory:  Recent Labs Lab 01/20/17 0551 01/21/17 0954 01/22/17 0345  WBC 6.5 5.3 4.2  HGB 9.4* 9.2* 8.4*  HCT 31.4* 31.0* 28.8*  PLT 278 266 260    Recent Labs Lab 01/16/17 1038 01/17/17 0400  01/20/17 0551 01/21/17 0954 01/22/17 0345  NA 138 131*  < > 137 137 137  K 4.0 3.8  < > 3.8 3.8 3.3*  CL 105 100*  < > 103 107 106  CO2 20* 24  < > 24 19* 22  BUN <5* 8  < > <5* <5* <5*  CREATININE 0.43* 0.52  < > 0.48 0.40* 0.39*  CALCIUM 8.9 8.4*  < > 8.8* 8.7* 8.2*  PROT 8.6* 7.0  --   --   --   --   BILITOT 0.4 0.4  --   --   --   --   ALKPHOS 63 54  --   --   --   --   ALT 11* 9*  --   --   --   --   AST 30 19  --   --   --   --   GLUCOSE 64* 121*  < > 90 87 91  < > = values in this interval not displayed. -Iron 13, TIBC 494, ferritin 5 -B12 nl -Retic 1.7 -LDH nl -Lactic acid 1.5 -UA- nl -UDS- +cocaine -Ethyl alcohol 235  Imaging/Diagnostic Tests: AXR- moderate stool   Lovenia Kim, MD 01/22/2017, 2:23 PM PGY-1, [place] Intern pager: [phone], text pages welcome

## 2017-01-22 NOTE — Progress Notes (Signed)
[facility]  Progress Note  Subjective: States she is feeling pretty good and "trying to stay positive" with regard to the knowledge that she likely has a rectal malignancy. Eager to learn the results of the pathology report and what her recommended treatment plan will be. No BM or blood per rectum but passing flatus. No issues with UO. No N/V.   Objective: Vital signs in last 24 hours: Temp:  [97.6 F (36.4 C)-98.1 F (36.7 C)] 98.1 F (36.7 C) (03/22 0443) Pulse Rate:  [49-95] 71 (03/22 0443) Resp:  [13-23] 18 (03/22 0443) BP: (106-161)/(62-117) 133/76 (03/22 0443) SpO2:  [94 %-100 %] 98 % (03/22 0443) Weight:  [51.8 kg (114 lb 4.8 oz)] 51.8 kg (114 lb 4.8 oz) (03/21 1030) Last BM Date: 01/21/17  Intake/Output from previous day: 03/21 0701 - 03/22 0700 In: 3260.8 [P.O.:477; I.V.:2533.8; IV Piggyback:250] Out: 2105 [Urine:2100; Blood:5] Intake/Output this shift: No intake/output data recorded.  PE: General: pleasant, WD, WN white female who is seated in bed in NAD. Eating breakfast.  HEENT: head is normocephalic, atraumatic.  Sclera are not injected and not icteric. Mouth is pink and moist Heart: regular, rate, and rhythm.  Normal s1,s2. No obvious murmurs. Lungs: CTAB, no wheezes, rhonchi, or rales noted.  Respiratory effort nonlabored Abd: soft, NT, ND, +BS, no masses, hernias, or organomegaly MS: all 4 extremities are symmetrical with no cyanosis, clubbing, or edema. Skin: warm and dry with no masses, lesions, or rashes Psych: A&Ox3 with an appropriate affect.  Lab Results:   Recent Labs  01/21/17 0954 01/22/17 0345  WBC 5.3 4.2  HGB 9.2* 8.4*  HCT 31.0* 28.8*  PLT 266 260   BMET  Recent Labs  01/21/17 0954 01/22/17 0345  NA 137 137  K 3.8 3.3*  CL 107 106  CO2 19* 22  GLUCOSE 87 91  BUN <5* <5*  CREATININE 0.40* 0.39*  CALCIUM 8.7* 8.2*   PT/INR No results for input(s): LABPROT, INR in the last 72 hours. CMP      Component Value Date/Time   NA 137 01/22/2017 0345   K 3.3 (L) 01/22/2017 0345   CL 106 01/22/2017 0345   CO2 22 01/22/2017 0345   GLUCOSE 91 01/22/2017 0345   BUN <5 (L) 01/22/2017 0345   CREATININE 0.39 (L) 01/22/2017 0345   CALCIUM 8.2 (L) 01/22/2017 0345   PROT 7.0 01/17/2017 0400   ALBUMIN 2.5 (L) 01/17/2017 0400   AST 19 01/17/2017 0400   ALT 9 (L) 01/17/2017 0400   ALKPHOS 54 01/17/2017 0400   BILITOT 0.4 01/17/2017 0400   GFRNONAA >60 01/22/2017 0345   GFRAA >60 01/22/2017 0345   Lipase     Component Value Date/Time   LIPASE 16 01/16/2017 1038       Studies/Results: No results found.  Anti-infectives: Anti-infectives    Start     Dose/Rate Route Frequency Ordered Stop   01/18/17 1000  ceFEPIme (MAXIPIME) 2 g in dextrose 5 % 50 mL IVPB     2 g 100 mL/hr over 30 Minutes Intravenous Every 12 hours 01/17/17 2145     01/17/17 1500  ceFEPIme (MAXIPIME) 2 g in dextrose 5 % 50 mL IVPB     2 g 100 mL/hr over 30 Minutes Intravenous  Once 01/17/17 1404 01/17/17 2354   01/17/17 1400  metroNIDAZOLE (FLAGYL) IVPB 500 mg     500 mg 100 mL/hr over 60 Minutes Intravenous Every 6 hours 01/17/17 1341  Assessment/Plan  HD #5 following admission for abdominal pain and anemia. Coloscopy performed yesterday indicated a nonobstructive partially circumferential 5cm in length infiltrative rectal mass. Pathology report pending.  Anemia - suspect 2/2 rectal mass, stable s/p transfusion. Normotensive with H/H of 8.4/28.8.  FEN: clears. Can advance to soft diet as rectal mass does not appear to be obstructive, necessitating urgent/emergent surgical intervention. ID: cefepime, flagyl. Afebrile. No leukocytosis. VTE: SCD's. No anticoagulants given high risk for GIB.  LOS: 5 days    LEE Teran Knittle, [facility] Surgery 01/22/2017, 9:31 AM

## 2017-01-22 NOTE — Progress Notes (Signed)
Subjective: Abdominal pain improving. Passing liquid stool and flatus.  Objective: Vital signs in last 24 hours: Temp:  [97.6 F (36.4 C)-98.1 F (36.7 C)] 98.1 F (36.7 C) (03/22 0443) Pulse Rate:  [49-95] 71 (03/22 0443) Resp:  [13-23] 18 (03/22 0443) BP: (106-161)/(62-117) 133/76 (03/22 0443) SpO2:  [94 %-100 %] 98 % (03/22 0443) Weight:  [51.8 kg (114 lb 4.8 oz)] 51.8 kg (114 lb 4.8 oz) (03/21 1030) Weight change:  Last BM Date: 01/21/17  PE: GEN:  NAD, thin but not cachectic-appearing ABD:  Soft, mild protuberant, minimal diffuse tenderness  Lab Results: CBC    Component Value Date/Time   WBC 4.2 01/22/2017 0345   RBC 4.27 01/22/2017 0345   HGB 8.4 (L) 01/22/2017 0345   HCT 28.8 (L) 01/22/2017 0345   HCT 26.2 (L) 01/16/2017 2146   PLT 260 01/22/2017 0345   MCV 67.4 (L) 01/22/2017 0345   MCH 19.7 (L) 01/22/2017 0345   MCHC 29.2 (L) 01/22/2017 0345   RDW 26.7 (H) 01/22/2017 0345   LYMPHSABS 2.5 01/16/2017 1038   MONOABS 0.6 01/16/2017 1038   EOSABS 0.2 01/16/2017 1038   BASOSABS 0.0 01/16/2017 1038   CMP     Component Value Date/Time   NA 137 01/22/2017 0345   K 3.3 (L) 01/22/2017 0345   CL 106 01/22/2017 0345   CO2 22 01/22/2017 0345   GLUCOSE 91 01/22/2017 0345   BUN <5 (L) 01/22/2017 0345   CREATININE 0.39 (L) 01/22/2017 0345   CALCIUM 8.2 (L) 01/22/2017 0345   PROT 7.0 01/17/2017 0400   ALBUMIN 2.5 (L) 01/17/2017 0400   AST 19 01/17/2017 0400   ALT 9 (L) 01/17/2017 0400   ALKPHOS 54 01/17/2017 0400   BILITOT 0.4 01/17/2017 0400   GFRNONAA >60 01/22/2017 0345   GFRAA >60 01/22/2017 0345   Assessment:  1.  Rectal mass, suspicious for adenocarcinoma, biopsies pending. 2.  Abdominal pain, likely from locally invasive rectal tumor. 3.  Partial bowel obstruction from rectal lesion; no evidence of complete bowel obstruction.  Plan:  1.  Oncology and surgery surgery to coordinate patient's management (chemoXRT first then surgery or vice versa). 2.   Awaiting biopsy results, hopefully back today. 3.  Clear liquid diet for now, pending coordination of patient's future management. 4.  After biopsy results are back, GI does not have any further inpatient plans for patient. 5.  [facility] will follow.   Landry Dyke 01/22/2017, 8:23 AM   Pager [phone] If no answer or after 5 PM call [phone]

## 2017-01-23 ENCOUNTER — Telehealth: Payer: Self-pay | Admitting: Hematology

## 2017-01-23 ENCOUNTER — Telehealth: Payer: Self-pay

## 2017-01-23 LAB — CBC
HCT: 29.5 % — ABNORMAL LOW (ref 36.0–46.0)
Hemoglobin: 8.8 g/dL — ABNORMAL LOW (ref 12.0–15.0)
MCH: 20.2 pg — AB (ref 26.0–34.0)
MCHC: 29.8 g/dL — AB (ref 30.0–36.0)
MCV: 67.7 fL — AB (ref 78.0–100.0)
PLATELETS: 257 10*3/uL (ref 150–400)
RBC: 4.36 MIL/uL (ref 3.87–5.11)
RDW: 27.1 % — ABNORMAL HIGH (ref 11.5–15.5)
WBC: 5.1 10*3/uL (ref 4.0–10.5)

## 2017-01-23 LAB — BASIC METABOLIC PANEL
Anion gap: 9 (ref 5–15)
CHLORIDE: 107 mmol/L (ref 101–111)
CO2: 21 mmol/L — ABNORMAL LOW (ref 22–32)
Calcium: 8.6 mg/dL — ABNORMAL LOW (ref 8.9–10.3)
Creatinine, Ser: 0.38 mg/dL — ABNORMAL LOW (ref 0.44–1.00)
GFR calc Af Amer: >60 mL/min (ref >60)
GLUCOSE: 90 mg/dL (ref 65–99)
POTASSIUM: 3.8 mmol/L (ref 3.5–5.1)
Sodium: 137 mmol/L (ref 135–145)

## 2017-01-23 MED ORDER — PANTOPRAZOLE SODIUM 40 MG PO TBEC: 40.0000 mg | DELAYED_RELEASE_TABLET | Freq: Every day | ORAL | 0 refills | Status: DC

## 2017-01-23 MED ORDER — ALBUTEROL SULFATE (2.5 MG/3ML) 0.083% IN NEBU: 2.5000 mg | INHALATION_SOLUTION | Freq: Four times a day (QID) | RESPIRATORY_TRACT | 12 refills | Status: DC | PRN

## 2017-01-23 MED ORDER — CIPROFLOXACIN HCL 500 MG PO TABS: 500.0000 mg | ORAL_TABLET | Freq: Two times a day (BID) | ORAL | 0 refills | Status: DC

## 2017-01-23 NOTE — Telephone Encounter (Signed)
-----   Message from Mickeal Skinner, MD sent at 01/23/2017  8:48 AM EDT ----- 51 yo female with new diagnosis of rectal cancer should be discussed at tumor board. I am setting her up to see Dr. Marcello Moores as outpatient.

## 2017-01-23 NOTE — Telephone Encounter (Signed)
Incoming message from Dr Kieth Brightly routed to Seth Bake for NP referral

## 2017-01-23 NOTE — Care Management Note (Addendum)
Case Management Note  Patient Details  Name: Allyce Bochicchio MRN: [id number] Date of Birth: [birth year]  Subjective/Objective:      Admitted with anemia, recta mass.              Action/Plan: Plan is to d/c to home today. Post hosptial follow scheduled for 3/36/2018 with Zettie Pho NP at 9:30 am @ the [facility]. Pt without insurance states can't afford medication. CM explained and provided pt with Match Letter.  Expected Discharge Date:    01/23/17           Expected Discharge Plan:  Home/Self Care  In-House Referral:  Clinical Social Work  Discharge planning Services  CM Consult  Status of Service:  completed  If discussed at [employer], dates discussed:    Additional Comments:  Sharin Mons, RN 01/23/2017, 10:09 AM

## 2017-01-23 NOTE — Progress Notes (Signed)
Gave pt d/c paperwork. Pt stated understanding. Pt has all belongings with pt. Pt d/c via wheelchair downstairs by NT Alex. Will continue to monitor pt. Sherry Baker

## 2017-01-23 NOTE — Plan of Care (Signed)
Biopsies showed adenocarcinoma.  Plan in place with oncology and surgery.  No further GI evaluation at this time.  Likely needs colonoscopy with Dr. Alessandra Bevels in one year, our office can arrange.  Will sign-off; please call with questions; thank you for the consultation.

## 2017-01-23 NOTE — Progress Notes (Signed)
Pt voiced has no means to get to MD visits @ d/c. CM spoke with pt and provided pt with SCAT information/application. Pt is to complete pt section and bring appllication to post hospital follow up visit  for provider to complete. Whitman Hero RN,BSN,CM

## 2017-01-23 NOTE — Progress Notes (Signed)
S: no new events, pathology results back reviewed with patient O: BP 133/84 (BP Location: Right Arm)   Pulse 64   Temp 97.8 F (36.6 C)   Resp 18   Ht 5\' 3"  (1.6 m)   Wt 51.8 kg (114 lb 4.8 oz)   SpO2 99%   BMI 20.25 kg/m  Gen: NAD Neuro: AOx4 abd: soft, NT, ND  A/P rectal cancer initially presented with concern for perforation, symptoms now resolved -ok to discharge -will have her see Dr. Marcello Moores for further plans in treating rectal cancer

## 2017-01-23 NOTE — Progress Notes (Signed)
Family Medicine Teaching Service Daily Progress Note Intern Pager: [phone]  Patient name: Sherry Baker Medical record number: [id number] Date of birth: [birth year] Age: 51 y.o. Gender: female  Primary Care Provider: No PCP Per Patient Consultants: GI, Surgery  Code Status: Full   Pt Overview and Major Events to Date:  3/16- Admitted to [facility] with epigastric pain and anemia  3/17- Hgb dropped to 6.63 > 1u pRBCs ordered; developed fever to 101.8 > infectious work-up; CT abdomen revealed rectal mass  3/19 - Hgb 7.0 > 1U pRBCs ordered. Post H+H 8.8.   Assessment and Plan: Sherry Baker is a 51 y.o. female presenting with epigastric abdominal pain and anemia. PMH is significant for substance abuse (alcohol, tobacco, cocaine), bipolar disorder, and protein-calorie malnutrition.    Rectal adenocarcinoma: Pathology confirmed adenocarcinoma.  Afebrile for >48 hours on cefepime/flagyl. Surgery not planning to operate unless she has new uncontrollable bleeding or recurrent abdominal pain concerning for perforation.  OK to DC from their standpoint. Recommending one week of augmentin and can start regular diet. Follow up appointment with Oncologist, Dr. Truitt Merle scheduled for 01/27/17.  -GI and surgery following and will follow up outpatient.  -Oncology appt on 3/27 - Complete 1 week augmentin  Fever, resolved. Patient has remained afebrile -Day 7 Abx - Cefepime (3/17 >3/23) and Flagyl (3/17>3/23) - Blood cultures: no growth final   Hypokalemia.  K+ to 3.8 this AM.  -Repleted with 40 kdur x2.  -Recheck BMET in morning   Microcytic Anemia: Hgb to 8.8. FOBT+. Iron panel with iron deficiency (ferritin 5). LDH and bili normal, making hemolysis less likely. B12 nl and folate low (26.2). Reticulocytes normal.  Haptoglobin elevated to 280.  Transfusion threshold 7.0.  - CBC stable at 8.8 - Holding on IV iron for now with possible active infection.  Consider outpatient po iron.     Epigastric Abdominal  Pain, improved: May be secondary to gastritis vs peptic ulcer disease, especially given her alcohol and tobacco use in combination with daily NSAID use. ACS unlikely with negative troponins. - Continue Protonix 40mg  daily - GI cocktail prn  Polysubstance Abuse: Daily alcohol use of two 40oz beers and occasional cocaine use. CIWA score 0 this AM and overnight.  -CSW for substance abuse  -avoid medications such as Tramadol that lower the seizure threshold   Tobacco Abuse: Smokes 6-7 cigarettes per day for "many years". Some wheezing on exam, but no diagnosed lung conditions. - Declines nicotine patch - Would benefit from outpatient PFTs  - Albuterol prn  Arthritis: Pt notes bilateral hip pain. She was supposed to follow-up with an orthopedic surgeon but she missed the appointment. - PT/OT consult - rec no PT/OT follow up   Protein-Calorie Malnutrition: BMI 18. Likely malnutrition in the setting of polysubstance abuse. - Nutrition consult  FEN/GI:  - Protonix as above, Miralax and Senokot-S for bowel regimen, regular diet Prophylaxis: SCDs in the setting of possible GI bleed  Disposition: OK for DC from surgery/GI standpoint and will have follow up with onc as well.    Subjective:  Feels well this morning. Denies any abd pain, CP or SOB. Feels optimistic about treatment for her new diagnosis.   Objective: Temp:  [97.8 F (36.6 C)-98.5 F (36.9 C)] 97.8 F (36.6 C) (03/23 0510) Pulse Rate:  [64-74] 64 (03/23 0510) Resp:  [18-19] 18 (03/23 0510) BP: (109-154)/(82-86) 133/84 (03/23 0510) SpO2:  [95 %-100 %] 99 % (03/23 0510)   Physical Exam: General: 51 y/o F sitting up in  bed appearing comfortable and in NAD Cardiovascular: RRR, no MRG  Respiratory: NWOB, CTABL Gastrointestinal: soft, NTND Extremities: no LE edema  Laboratory:  Recent Labs Lab 01/21/17 0954 01/22/17 0345 01/23/17 0432  WBC 5.3 4.2 5.1  HGB 9.2* 8.4* 8.8*  HCT 31.0* 28.8* 29.5*  PLT 266 260 257     Recent Labs Lab 01/16/17 1038 01/17/17 0400  01/21/17 0954 01/22/17 0345 01/23/17 0432  NA 138 131*  < > 137 137 137  K 4.0 3.8  < > 3.8 3.3* 3.8  CL 105 100*  < > 107 106 107  CO2 20* 24  < > 19* 22 21*  BUN <5* 8  < > <5* <5* <5*  CREATININE 0.43* 0.52  < > 0.40* 0.39* 0.38*  CALCIUM 8.9 8.4*  < > 8.7* 8.2* 8.6*  PROT 8.6* 7.0  --   --   --   --   BILITOT 0.4 0.4  --   --   --   --   ALKPHOS 63 54  --   --   --   --   ALT 11* 9*  --   --   --   --   AST 30 19  --   --   --   --   GLUCOSE 64* 121*  < > 87 91 90  < > = values in this interval not displayed. -Iron 13, TIBC 494, ferritin 5 -B12 nl -Retic 1.7 -LDH nl -Lactic acid 1.5 -UA- nl -UDS- +cocaine -Ethyl alcohol 235  Imaging/Diagnostic Tests: Ct Chest Wo Contrast  Result Date: 01/19/2017 CLINICAL DATA:  Known rectal mass, followup exam EXAM: CT CHEST WITHOUT CONTRAST TECHNIQUE: Multidetector CT imaging of the chest was performed following the standard protocol without IV contrast. COMPARISON:  01/17/2017 FINDINGS: Cardiovascular: Somewhat limited due to the lack of IV contrast. Aortic calcifications are seen as well as coronary and brachiocephalic vessel calcifications. No aneurysmal dilatation is noted. The cardiac enlargement is seen. Mediastinum/Nodes: Thoracic inlet is within normal limits. No hilar or mediastinal adenopathy is seen. Esophagus as visualized shows a small hiatal hernia. Lungs/Pleura: Mild emphysematous changes are noted. No focal infiltrate or sizable effusion is seen. Upper Abdomen: No acute abnormality. Musculoskeletal: No acute bony abnormality noted. IMPRESSION: No acute abnormality noted. Electronically Signed   By: Inez Catalina M.D.   On: 01/19/2017 19:37     Eloise Levels, MD 01/23/2017, 8:47 AM PGY-1, [place] [facility] Intern pager: [phone], text pages welcome

## 2017-01-23 NOTE — Telephone Encounter (Signed)
Dr. Rosana Berger from the hospital cld to schedule Sherry Baker an appt. Appt has been scheduled for the pt to see Dr. Burr Medico on 3/27 at 11am. Appt date and time will be given to the pt prior to being discharged from the hosp. Letter mailed to the pt.

## 2017-01-23 NOTE — Telephone Encounter (Signed)
This CM spoke to Whitman Hero, RN CM about transportation options for the patient as she has an appointment at [facility] on 01/26/17. Informed her that the [facility] does not provide transportation to the clinic unless the patient is part of the T[facility] and she is not. Encouraged her to apply for SCAT.  She can bring her SCAT application to the [facility] on 01/26/17 for her appointment and can receive assistance with completing it if needed.  Marcellus Scott, RN CM to inform the patient of the SCAT option.

## 2017-01-23 NOTE — Care Management Important Message (Deleted)
Important Message  Patient Details  Name: Sherry Baker MRN: [id number] Date of Birth: 1966-08-28   Medicare Important Message Given:  Yes    Sharin Mons, RN 01/23/2017, 9:26 AM

## 2017-01-23 NOTE — Discharge Instructions (Signed)
You were unfortunately found to have a cancer in your colon.  The surgery team will be following you closely and they want you to call their office at [phone] to make an appointment with Dr. Leighton Ruff.  It is also very important that you schedule an appointment with your primary doctor.   Additionally, we have also made an appointment you with a cancer specialist (Oncologist) Dr. Truitt Merle on 01/27/2017 at 11:00AM.

## 2017-01-26 ENCOUNTER — Encounter: Payer: Self-pay | Admitting: Physician Assistant

## 2017-01-26 ENCOUNTER — Ambulatory Visit: Payer: Medicaid Other | Attending: Internal Medicine | Admitting: Physician Assistant

## 2017-01-26 VITALS — BP 140/82 | HR 128 | Temp 98.2°F | Resp 16 | Wt 122.0 lb

## 2017-01-26 DIAGNOSIS — K7460 Unspecified cirrhosis of liver: Secondary | ICD-10-CM | POA: Diagnosis not present

## 2017-01-26 DIAGNOSIS — Z880 Allergy status to penicillin: Secondary | ICD-10-CM | POA: Insufficient documentation

## 2017-01-26 DIAGNOSIS — C20 Malignant neoplasm of rectum: Secondary | ICD-10-CM | POA: Diagnosis not present

## 2017-01-26 DIAGNOSIS — F319 Bipolar disorder, unspecified: Secondary | ICD-10-CM | POA: Diagnosis not present

## 2017-01-26 DIAGNOSIS — F17210 Nicotine dependence, cigarettes, uncomplicated: Secondary | ICD-10-CM | POA: Insufficient documentation

## 2017-01-26 DIAGNOSIS — D509 Iron deficiency anemia, unspecified: Secondary | ICD-10-CM | POA: Insufficient documentation

## 2017-01-26 DIAGNOSIS — I10 Essential (primary) hypertension: Secondary | ICD-10-CM | POA: Diagnosis not present

## 2017-01-26 DIAGNOSIS — F1910 Other psychoactive substance abuse, uncomplicated: Secondary | ICD-10-CM | POA: Diagnosis not present

## 2017-01-26 MED ORDER — PANTOPRAZOLE SODIUM 40 MG PO TBEC: 40.0000 mg | DELAYED_RELEASE_TABLET | Freq: Every day | ORAL | 3 refills | Status: DC

## 2017-01-26 MED ORDER — FERROUS SULFATE 325 (65 FE) MG PO TABS: 325.0000 mg | ORAL_TABLET | Freq: Two times a day (BID) | ORAL | 3 refills | Status: DC

## 2017-01-26 MED ORDER — ALBUTEROL SULFATE (2.5 MG/3ML) 0.083% IN NEBU: 2.5000 mg | INHALATION_SOLUTION | Freq: Four times a day (QID) | RESPIRATORY_TRACT | 12 refills | Status: DC | PRN

## 2017-01-26 MED ORDER — FLUOXETINE HCL 20 MG PO TABS: 20.0000 mg | ORAL_TABLET | Freq: Every day | ORAL | 3 refills | Status: DC

## 2017-01-26 MED ORDER — CLONIDINE HCL 0.1 MG PO TABS
0.1000 mg | ORAL_TABLET | Freq: Once | ORAL | Status: AC
  Administered 2017-01-26: 0.1 mg via ORAL

## 2017-01-26 MED ORDER — CIPROFLOXACIN HCL 500 MG PO TABS: 500.0000 mg | ORAL_TABLET | Freq: Two times a day (BID) | ORAL | 0 refills | Status: AC

## 2017-01-26 MED FILL — FERROUS SULFATE 325 MG TAB: 325 (65 FE) | 30 days supply | Qty: 60 | Fill #0

## 2017-01-26 MED FILL — PANTOPRAZOLE SOD DR 40 MG T: 40 | 30 days supply | Qty: 30 | Fill #0

## 2017-01-26 MED FILL — CIPROFLOXACIN HCL 500 MG TA: 500 | 7 days supply | Qty: 14 | Fill #0

## 2017-01-26 NOTE — Discharge Summary (Signed)
[facility] Discharge Summary  Patient name: Sherry Baker Medical record number: [id number] Date of birth: 09/13/1966 Age: 51 y.o. Gender: female Date of Admission: 01/16/2017  Date of Discharge: 01/23/2017 Admitting Physician: Kinnie Feil, MD  Primary Care Provider: No PCP Per Patient Consultants: GI, Oncology, Surgery  Indication for Hospitalization: Epigastric abdominal pain   Discharge Diagnoses/Problem List:  Adenocarcinoma Microcytic anemia Constipation Hypoglycemia Polysubstance abuse Tobacco abuse Arthritis Protein-calorie malnutrition   Disposition: Home   Discharge Condition: Stable, improved   Discharge Exam:  General: 51 y/o F sitting up in bed appearing comfortable and in NAD Cardiovascular: RRR, no MRG  Respiratory: NWOB, CTABL Gastrointestinal: soft, NTND Extremities: no LE edema Neuro: AAOx3, no focal deficits, 5/5 strength in upper and LE bilaterally   Brief Hospital Course:  Arthurine Oleary presented to ED c/o epigastric abdominal pain for last few days associated with nausea but no vomiting.  Denied recent blood in stool but endorsed 2 episodes of rectal bleeding (first 6 months ago, then 3 months ago).  Patient with fever with negative CXR and non-infectious UA.  Also with worsening Hgb from 7.5 on admission to 6.3 initially with no signs of active bleeding.    FOBT was performed and found to be positive.  CT abdomen showed a diffusely thickened, irregular rectum with a soft tissue mass extending into the perirectal fat which may represent small abscess with micro-perforation, findings concerning for advanced rectal cancer vs. severe proctitis.  Cefepime and Flagyl added for possible intaabdominal infection given fevers overnight. GI consult was placed and discussed with IR Dr. Laurence Ferrari who stated there was no drain-able collection of fluid. DRE performed with edge of firm mass palpated in the distal posterior region of the  rectum. Surgery was also consulted at the recommendation of GI and IR. GI recommended colonoscopy with biopsy once she had received several days of antibiotics and was no longer febrile.  Transfusion of 1uPRBC x2 in setting of low hemoglobin prior to colonoscopy.  Colonoscopy revealed a malignant tumor in the rectum.  Oncology was consulted. Pathology confirmed adenocarcinoma.  Surgery and oncology discussed further planning for patient, and cleared to discharge with outpatient oncology appointment scheduled for 3/27. She was transitioned to po antibiotics and per surgery she was to continue 1 additional week of po Augmentin.  At time of discharge, patient was stable, tolerating po and agreeable to outpatient follow up.   Issues for Follow Up:  1. Outpatient follow up with Oncology.  Appt scheduled for 3/27 with Dr. Truitt Merle.  2. Patient to complete 1 week of Augmentin.  Please make sure she has received this and is taking it.  3. Per GI, likely needs colonoscopy with Dr. Alessandra Bevels in one year.  4. Recheck BMET as her K+ required repleting during hospitalization.  5. Recheck CBC to ensure Hgb stable. She required 2 U transfusion during hospitalization.  6. Consider outpatient po iron.  IV iron was held on hospitalization in setting of active infection.  7. Would benefit from outpatient PFTs. History of tobacco use and some wheezing on exam however no diagnosed lung conditions.   Significant Procedures: Biopsy, colonoscopy, blood transfusion   Significant Labs and Imaging:   Recent Labs Lab 01/21/17 0954 01/22/17 0345 01/23/17 0432  WBC 5.3 4.2 5.1  HGB 9.2* 8.4* 8.8*  HCT 31.0* 28.8* 29.5*  PLT 266 260 257    Recent Labs Lab 01/19/17 0518 01/20/17 0551 01/21/17 0954 01/22/17 0345 01/23/17 0432  NA 134* 137 137  137 137  K 3.5 3.8 3.8 3.3* 3.8  CL 105 103 107 106 107  CO2 23 24 19* 22 21*  GLUCOSE 85 90 87 91 90  BUN <5* <5* <5* <5* <5*  CREATININE 0.35* 0.48 0.40* 0.39* 0.38*   CALCIUM 8.1* 8.8* 8.7* 8.2* 8.6*   Results/Tests Pending at Time of Discharge: None  Discharge Medications:  Allergies as of 01/23/2017      Reactions   Penicillins Hives, Swelling   Has patient had a PCN reaction causing immediate rash, facial/tongue/throat swelling, SOB or lightheadedness with hypotension: Yes Has patient had a PCN reaction causing severe rash involving mucus membranes or skin necrosis: No Has patient had a PCN reaction that required hospitalization No Has patient had a PCN reaction occurring within the last 10 years: Yes If all of the above answers are "NO", then may proceed with Cephalosporin use.      Medication List    TAKE these medications   albuterol (2.5 MG/3ML) 0.083% nebulizer solution Commonly known as:  PROVENTIL Take 3 mLs (2.5 mg total) by nebulization every 6 (six) hours as needed for wheezing or shortness of breath.   ciprofloxacin 500 MG tablet Commonly known as:  CIPRO Take 1 tablet (500 mg total) by mouth 2 (two) times daily.   pantoprazole 40 MG tablet Commonly known as:  PROTONIX Take 1 tablet (40 mg total) by mouth daily.      Discharge Instructions: Please refer to Patient Instructions section of EMR for full details.  Patient was counseled important signs and symptoms that should prompt return to medical care, changes in medications, dietary instructions, activity restrictions, and follow up appointments.   Follow-Up Appointments: Follow-up Information    Rosario Adie., MD Follow up in 3 week(s).   Specialty:  General Surgery Contact information: [address] [place] [address] [phone]        [facility]. Go on 01/26/2017.   Why:  Post hosptial follow scheduled for 3/36/2018 with Zettie Pho NP at 9:30 am Contact information: [place] [phone]       Truitt Merle, MD. Go on 01/27/2017.   Specialties:  Hematology,  Oncology Why:  @ 11AM, please arrive at 10:30AM.  Contact information: [place] [address] [phone]          Lovenia Kim, MD 01/26/2017, 1:41 AM PGY-1, [place]

## 2017-01-26 NOTE — Progress Notes (Addendum)
Sherry Baker  DUK:[id number]  BJS:[id number]  DOB - 1966-03-23  Chief Complaint  Patient presents with  . Hospitalization Follow-up       Subjective:   Sherry Baker is a 51 y.o. female here today for establishment of care. She has a past medical history of bipolar type I, alcohol with cirrhosis, hypertension and cocaine use. She presented to the hospital on 01/16/2017 with generalized pain especially in bilateral hips. She also has some abdominal pain and some rectal bleeding in the past. She wasn't having active rectal bleeding but reported that over the last couple of months she was passing clots. Her initial FOBT was negative. Her initial hemoglobin was 7.5. Her temperature trended to 101.2 and the internal medicine team was contacted for admission. Her chest x-ray was negative.  Abdominal x-ray showed stool. Iron level was 13. MCV 60. She was transfused 2 units once her fever subsided. She was given laxatives. She was placed under the CIWA protocol. CT of the abdomen showed a thickened irregular rectum with the mass. Question whether this was proctitis and antibiotics were initiated versus some intra-abdominal infection. GI was consult and later surgery. Colonoscopy revealed a malignant tumor and later pathology confirmed adenocarcinoma. Oncology has seen her. She has been transitioned to oral antibiotics. Her hemoglobin at discharge was 8.8.  She is very nervous today. She is very tearful. She does not have much money. She does not have insurance. She did not get any of her prescriptions filled yet. She is smoking one fourth pack per day of cigarettes. She has used cocaine since discharge. She also has drank 40 ounces daily since discharge.   ROS: GEN: denies fever or chills, denies change in weight Skin: denies lesions or rashes HEENT: denies headache, earache, epistaxis, sore throat, or neck pain LUNGS: denies SHOB, dyspnea, PND, orthopnea CV: denies CP or palpitations ABD: denies  abd pain, N or V EXT: denies muscle spasms or swelling; no pain in lower ext, no weakness NEURO: denies numbness or tingling, denies sz, stroke or TIA  ALLERGIES: Allergies  Allergen Reactions  . Penicillins Hives and Swelling    Has patient had a PCN reaction causing immediate rash, facial/tongue/throat swelling, SOB or lightheadedness with hypotension: Yes Has patient had a PCN reaction causing severe rash involving mucus membranes or skin necrosis: No Has patient had a PCN reaction that required hospitalization No Has patient had a PCN reaction occurring within the last 10 years: Yes If all of the above answers are "NO", then may proceed with Cephalosporin use.     PAST MEDICAL HISTORY: Past Medical History:  Diagnosis Date  . Alcohol abuse   . Arthritis   . Bipolar 1 disorder ([place])   . Cirrhosis of liver ([place])   . Depression   . Hypertension     PAST SURGICAL HISTORY: Past Surgical History:  Procedure Laterality Date  . COLONOSCOPY WITH PROPOFOL Left 01/21/2017   Procedure: COLONOSCOPY WITH PROPOFOL;  Surgeon: Otis Brace, MD;  Location: [facility];  Service: Gastroenterology;  Laterality: Left;  Marland Kitchen MANDIBLE FRACTURE SURGERY      MEDICATIONS AT HOME: Prior to Admission medications   Medication Sig Start Date End Date Taking? Authorizing Provider  albuterol (PROVENTIL) (2.5 MG/3ML) 0.083% nebulizer solution Take 3 mLs (2.5 mg total) by nebulization every 6 (six) hours as needed for wheezing or shortness of breath. 01/26/17   Brayton Caves, PA-C  ciprofloxacin (CIPRO) 500 MG tablet Take 1 tablet (500 mg total) by mouth 2 (two) times  daily. 01/26/17 02/02/17  Brayton Caves, PA-C  ferrous sulfate (QC FERROUS SULFATE) 325 (65 FE) MG tablet Take 1 tablet (325 mg total) by mouth 2 (two) times daily with a meal. 01/26/17   Brayton Caves, PA-C  FLUoxetine (PROZAC) 20 MG tablet Take 1 tablet (20 mg total) by mouth daily. 01/26/17   Peggy Loge Daneil Dan, PA-C  pantoprazole (PROTONIX)  40 MG tablet Take 1 tablet (40 mg total) by mouth daily. 01/26/17   Brayton Caves, PA-C    No family history on file.  @SOCIALHX @  Objective:   Vitals:   01/26/17 0937 01/26/17 0944  BP: (!) 176/105 (!) 162/110  Pulse: (!) 128   Resp: 16   Temp: 98.2 F (36.8 C)   TempSrc: Oral   SpO2: 99%   Weight: 122 lb (55.3 kg)     Exam General appearance : Awake, alert, not in any distress. Speech Clear. Not toxic looking HEENT: Atraumatic and Normocephalic, pupils equally reactive to light and accomodation Neck: supple, no JVD. No cervical lymphadenopathy.  Chest:Good air entry bilaterally, no added sounds  CVS: tachy but regular, no murmurs.  Abdomen: Bowel sounds present, Non tender and not distended with no guarding, rigidity or rebound. Extremities: B/L Lower Ext shows no edema, both legs are warm to touch Neurology: Awake alert, and oriented X 3, CN II-XII intact, Non focal Skin:No Rash Wounds:N/A  Assessment & Plan  1. Adenocarcinoma of rectum  -Keep appt with Onc (Dr. Burr Medico) tomorrow  -Gen surg referral-Dr. Marcello Moores 2. Fe def anemia 2/2 #1  -FeSO4 BID   -Recheck CBC 1 week  -Onc appt tomorrow  -PPI 3.Polysubstance abuse/Bipolar D/O 1   -cessation discussed  -Prozac  -Our SW will f/u with her 4. HTN  -Clonidine given  -did not start any antihypertensives today  -re check 1 week 5. Tobacco abuse  -cessation discussed, she is not ready to [employer] assistance appt BMET and CBC in 1 week Return in about 1 week (around 02/02/2017).  The patient was given clear instructions to go to ER or return to medical center if symptoms don't improve, worsen or new problems develop. The patient verbalized understanding. The patient was told to call to get lab results if they haven't heard anything in the next week.   Total time spent with patient was 29 min. Greater than 50 % of this visit was spent face to face counseling and coordinating care regarding risk factor  modification, compliance importance and encouragement, education related to substance abuse cessation, RF modification and meds.  This note has been created with Surveyor, quantity. Any transcriptional errors are unintentional.    Zettie Pho, PA-C [facility] [place], [place]   01/26/2017, 10:07 AM

## 2017-01-27 ENCOUNTER — Telehealth: Payer: Self-pay | Admitting: Hematology

## 2017-01-27 ENCOUNTER — Ambulatory Visit: Payer: Self-pay | Admitting: Hematology

## 2017-01-27 DIAGNOSIS — C20 Malignant neoplasm of rectum: Secondary | ICD-10-CM | POA: Insufficient documentation

## 2017-01-27 NOTE — Telephone Encounter (Signed)
Pt cld to reschedule her appt due to no transportation. Appt has been rescheduled for the pt to see Dr. Benay Spice on 3/29 at 8am. I explained to the pt that she needs to arrive no later than 745am. She voiced understanding.

## 2017-01-28 ENCOUNTER — Encounter: Payer: Self-pay | Admitting: Radiation Oncology

## 2017-01-28 ENCOUNTER — Other Ambulatory Visit (HOSPITAL_COMMUNITY): Payer: Self-pay | Admitting: General Surgery

## 2017-01-28 DIAGNOSIS — C20 Malignant neoplasm of rectum: Secondary | ICD-10-CM

## 2017-01-28 NOTE — Progress Notes (Signed)
GI Location of Tumor / Histology: Rectum   Sherry Baker presented  months ago with symptoms of: abdominal  Pain with nausea, rectal bleeding 6 months ago and then 3 months ago ,fever  Biopsies of  (if applicable) revealed:Diagnosis 01/21/2017: 1. Colon, polyp(s), sigmoid - BENIGN POLYPOID COLONIC MUCOSA.- NO ADENOMATOUS CHANGE OR MALIGNANCY IDENTIFIED. 2. Rectum, biopsy - ADENOCARCINOMA.- SEE MICROSCOPIC DESCRIPTION  Past/Anticipated interventions by surgeon, if any:  No  Past/Anticipated interventions by medical oncology, if any: Dr. Benay Spice appt 01/29/17 at 8am  Weight changes, if any: no, stable Bowel/Bladder complaints, if any: blood in toilet after bowel movements once in a while, regular bowel movements, no issues with bladder  Nausea / Vomiting, if any:NO  Pain issues, if any: No  Any blood per rectum:  Once in a while after bm  SAFETY ISSUES: no  Prior radiation? No   Pacemaker/ICD?  NO  Possible current pregnancy? NO  Current Complaints/Details:Bipolar,  2 sons, alcohol with cirrhosis, cocaine use since d/c hospital recently, drinks 40 ounces  Beer 2x week,  smokes 1/4ppd , since age 31, depression, anemia Recent Blood products: 01/19/17 1 unit PRBC, 01/17/17 ! Unit PRBC Med Onc vitals today" 98.5, B/P=165/84,P=84, RR=18, 100% room air sats Wt Readings from Last 3 Encounters:  01/29/17 121 lb 4.8 oz (55 kg)  01/26/17 122 lb (55.3 kg)  01/21/17 114 lb 4.8 oz (51.8 kg)

## 2017-01-29 ENCOUNTER — Ambulatory Visit
Admission: RE | Admit: 2017-01-29 | Discharge: 2017-01-29 | Disposition: A | Discharge status: Home / Self Care | Payer: Medicaid Other | Source: Ambulatory Visit | Attending: Radiation Oncology | Admitting: Radiation Oncology

## 2017-01-29 ENCOUNTER — Telehealth: Payer: Self-pay | Admitting: Oncology

## 2017-01-29 ENCOUNTER — Encounter: Payer: Self-pay | Admitting: Radiation Oncology

## 2017-01-29 ENCOUNTER — Ambulatory Visit (HOSPITAL_BASED_OUTPATIENT_CLINIC_OR_DEPARTMENT_OTHER): Payer: Self-pay | Admitting: Oncology

## 2017-01-29 VITALS — BP 165/84 | HR 94 | Temp 98.3°F | Resp 18 | Ht 63.0 in | Wt 121.3 lb

## 2017-01-29 DIAGNOSIS — C20 Malignant neoplasm of rectum: Secondary | ICD-10-CM | POA: Insufficient documentation

## 2017-01-29 DIAGNOSIS — Z800 Family history of malignant neoplasm of digestive organs: Secondary | ICD-10-CM

## 2017-01-29 DIAGNOSIS — Z880 Allergy status to penicillin: Secondary | ICD-10-CM | POA: Insufficient documentation

## 2017-01-29 DIAGNOSIS — D509 Iron deficiency anemia, unspecified: Secondary | ICD-10-CM

## 2017-01-29 DIAGNOSIS — F17210 Nicotine dependence, cigarettes, uncomplicated: Secondary | ICD-10-CM | POA: Insufficient documentation

## 2017-01-29 DIAGNOSIS — Z510 Encounter for antineoplastic radiation therapy: Secondary | ICD-10-CM | POA: Insufficient documentation

## 2017-01-29 DIAGNOSIS — Z808 Family history of malignant neoplasm of other organs or systems: Secondary | ICD-10-CM

## 2017-01-29 DIAGNOSIS — F319 Bipolar disorder, unspecified: Secondary | ICD-10-CM | POA: Insufficient documentation

## 2017-01-29 DIAGNOSIS — K7460 Unspecified cirrhosis of liver: Secondary | ICD-10-CM | POA: Insufficient documentation

## 2017-01-29 DIAGNOSIS — M1990 Unspecified osteoarthritis, unspecified site: Secondary | ICD-10-CM | POA: Insufficient documentation

## 2017-01-29 DIAGNOSIS — I10 Essential (primary) hypertension: Secondary | ICD-10-CM | POA: Diagnosis not present

## 2017-01-29 DIAGNOSIS — Z720 Tobacco use: Secondary | ICD-10-CM

## 2017-01-29 DIAGNOSIS — Z79899 Other long term (current) drug therapy: Secondary | ICD-10-CM | POA: Diagnosis not present

## 2017-01-29 DIAGNOSIS — Z7289 Other problems related to lifestyle: Secondary | ICD-10-CM

## 2017-01-29 HISTORY — DX: Malignant (primary) neoplasm, unspecified: C80.1

## 2017-01-29 HISTORY — DX: Allergy, unspecified, initial encounter: T78.40XA

## 2017-01-29 NOTE — Telephone Encounter (Signed)
sch genetics appt per [facility] in radonc

## 2017-01-29 NOTE — Progress Notes (Signed)
Sherry Baker   Referring MD: Parag Brahmbhatt  Sherry Baker 51 y.o.  07/28/1966    Reason for Referral: Rectal cancer   HPI: Sherry Baker reports a history of rectal bleeding for many months. Sherry Baker developed constipation for 1 month with associated abdominal pain. Sherry Baker presented to the emergency room on 01/16/2017. Sherry Baker was noted to have severe anemia. A CT of the abdomen and pelvis on 01/17/2018 revealed no discrete liver lesion. Large enhancing mass was noted in the rectum with extension into the perirectal fat, possible small perirectal abscess. No adenopathy. A CT of the chest 01/19/2017 revealed no acute abnormality.  Gastroenterology was consulted and Sherry Baker was taken to a colonoscopy by Dr. Alessandra Bevels on 01/21/2017. A mass was noted on rectal examination. A 7 mm polyp was noted in the sigmoid colon. The polyp was removed. A nonobstructing mass was found in the rectum. The mass was partially circumferential. Oozing was present. Biopsies were obtained. The pathology ([id number]) confirmed adenocarcinoma involving the rectal biopsy. The sigmoid colon polyp returned as benign polypoid colonic mucosa.  Sherry Baker is referred to medical oncology and radiation oncology today. Sherry Baker is scheduled to see Dr. Marcello Moores.     Past Medical History:  Diagnosis Date  . Alcohol abuse   . Allergy    PCNS swelling  . Arthritis   . Bipolar 1 disorder ([place])   . Cancer ([place]) 01/21/2017   rectal cancer  . Cirrhosis of liver ([place])   . Depression   . Hypertension     .  G2 P2  Past Surgical History:  Procedure Laterality Date  . COLONOSCOPY WITH PROPOFOL Left 01/21/2017   Procedure: COLONOSCOPY WITH PROPOFOL;  Surgeon: Otis Brace, MD;  Location: [facility];  Service: Gastroenterology;  Laterality: Left;  Marland Kitchen MANDIBLE FRACTURE SURGERY      Medications: Reviewed  Allergies:  Allergies  Allergen Reactions  . Penicillins Hives and Swelling    Has patient had a PCN  reaction causing immediate rash, facial/tongue/throat swelling, SOB or lightheadedness with hypotension: Yes Has patient had a PCN reaction causing severe rash involving mucus membranes or skin necrosis: No Has patient had a PCN reaction that required hospitalization No Has patient had a PCN reaction occurring within the last 10 years: Yes If all of the above answers are "NO", then may proceed with Cephalosporin use.     Family history: Her mother had "stomach cancer ". A maternal aunt had head and neck cancer and was a smoker. Sherry Baker has one brother and one sister. No other family history of cancer.  Social History:   Sherry Baker lives with a friend in [place]. Sherry Baker is unemployed. Sherry Baker smokes cigarettes. Sherry Baker drinks per. No risk factor for HIV or hepatitis.  History  Alcohol Use  . 1.2 oz/week  . 2 Cans of beer per week    Comment: 40oz. beer x 2 everyday    History  Smoking Status  . Current Every Day Smoker  . Packs/day: 0.50  . Years: 41.00  . Types: Cigarettes  Smokeless Tobacco  . Former Systems developer    Comment: patient refused      ROS:   Positives include:Intermittent rectal bleeding 4 months, constipation, exertional wheezing and dyspnea, bilateral "hip pain "that limits ambulation  A complete ROS was otherwise negative.  Physical Exam:  Blood pressure (!) 165/84, pulse 94, temperature 98.3 F (36.8 C), temperature source Oral, resp. rate 18, height 5\' 3"  (1.6 m), weight 121 lb 4.8 oz (55 kg), SpO2 100 %.  HEENT: Multiple missing teeth, oropharynx without visible mass, neck without mass Lungs: Clear bilaterally, no respiratory distress Cardiac: Regular rate and rhythm Abdomen: No hepatosplenomegaly, no mass, nontender Rectal: Skin tags at the anal verge, Mass. at the posterior rectum beginning approximately 2 cm from the anal verge  Vascular: No leg edema Lymph nodes: No cervical, supraclavicular, axillary, or inguinal nodes Neurologic: Alert and oriented, the motor exam  appears intact in the upper and lower extremities Skin: No rash Musculoskeletal: No spine tenderness   LAB:  CBC  Lab Results  Component Value Date   WBC 5.1 01/23/2017   HGB 8.8 (L) 01/23/2017   HCT 29.5 (L) 01/23/2017   MCV 67.7 (L) 01/23/2017   PLT 257 01/23/2017   NEUTROABS 5.7 01/16/2017     CMP      Component Value Date/Time   NA 137 01/23/2017 0432   K 3.8 01/23/2017 0432   CL 107 01/23/2017 0432   CO2 21 (L) 01/23/2017 0432   GLUCOSE 90 01/23/2017 0432   BUN <5 (L) 01/23/2017 0432   CREATININE 0.38 (L) 01/23/2017 0432   CALCIUM 8.6 (L) 01/23/2017 0432   PROT 7.0 01/17/2017 0400   ALBUMIN 2.5 (L) 01/17/2017 0400   AST 19 01/17/2017 0400   ALT 9 (L) 01/17/2017 0400   ALKPHOS 54 01/17/2017 0400   BILITOT 0.4 01/17/2017 0400   GFRNONAA >60 01/23/2017 0432   GFRAA >60 01/23/2017 0432      Imaging: CT images from 01/17/2017-reviewed   Assessment/Plan:   1. Rectal cancer-distal rectal mass noted on colonoscopy 01/21/2017 with a biopsy confirming adenocarcinoma   CT abdomen/pelvis 01/17/2017 and CT chest 01/19/2017 with no evidence of distant metastatic disease  2.   Iron deficiency anemia secondary to #1  3.   Alcohol and tobacco use   Disposition:   Sherry Baker has been diagnosed with rectal cancer. Sherry Baker appears to have locally advanced disease based on the staging evaluation to date. Her case was presented at the GI tumor conference on 01/28/2017. Anemia is to proceed with neoadjuvant chemotherapy and radiation. Sherry Baker is scheduled to see Dr. Marcello Moores for surgical consultation. Sherry Baker has been scheduled for a staging pelvic MRI.  Sherry Baker is scheduled to see Dr. Lisbeth Renshaw later today. I recommend concurrent chemotherapy and radiation. I recommend capecitabine chemotherapy. We reviewed the potential toxicities associated with capecitabine including the chance for nausea/vomiting, mucositis, diarrhea, and hematologic toxicity. We discussed the sun sensitivity,  hyperpigmentation, rash, and hand/foot syndrome associated with capecitabine. Sherry Baker agrees to proceed.  I anticipate chemotherapy/radiation beginning on 02/09/2017. Sherry Baker will return for an office and lab visit on 02/20/2017. SherryBaker will be referred for a chemotherapy teaching class.  50 minutes were spent with the patient today. The majority of the time was used for counseling and coordination of care.   Betsy Coder, MD  01/29/2017, 8:22 AM

## 2017-01-29 NOTE — Progress Notes (Signed)
Please see the Nurse Progress Note in the MD Initial Consult Encounter for this patient. 

## 2017-01-29 NOTE — Telephone Encounter (Signed)
Gave patient avs report and appointments for April  °

## 2017-01-29 NOTE — Progress Notes (Signed)
Radiation Oncology         [phone]) [phone] ________________________________  Name: Sherry Baker MRN: [id number]  Date: 01/29/2017  DOB: [birth year]  CC:No PCP Per Patient  Truitt Merle, MD     REFERRING PHYSICIAN: Truitt Merle, MD   DIAGNOSIS: The encounter diagnosis was Rectal cancer [facility]).   HISTORY OF PRESENT ILLNESS: Sherry Baker is a 51 y.o. female seen at the request of Dr. Benay Spice for a new diagnosis of rectal cancer. She was admitted to the hospital on 3/16 after presenting with hematochezia, and a hemoglobin of 7.5. CT of the abd/pelvis on 01/17/17 showed a diffusely thickened and irregular rectum with what appears to be a lobular enhancing soft tissue mass measuring 5.7 x 4.1 cm  extending into the perirectal fat and at least 1 small (11 mm) low-attenuation region in the perirectal fat may represent a small abscess and thus evidence of micro perforation.  No definite metastatic adenopathy or distant metastatic disease was noted. CT of the chest on 01/19/17 showed no acute abnormalities. A colonoscopy on 01/21/17 with Dr. Lynne Logan revealed  a hard rectal mass on digital exam. On endoscopic exam,the tumor was  located predominantly at the posterior bowel wall. A 7 mm semi-sessile polyp was found in the sigmoid colon. The polyp was removed with a cold snare. Resection and retrieval were complete. An infiltrative, polypoid, and ulcerated non-obstructing large mass was found in the rectum. The mass was partially circumferential (involving two-thirds of the lumen circumference). The mass measured 5 cm in length. Oozing was present. Biopsies revealed  adenocarcinoma. She required 2 units of PRBCs during her admission. She has orders to undergo MRI of the pelvis in about 2 weeks. She has met with Dr. Benay Spice earlier today to discuss systemic therapy and she presents today to discuss radiation for the management of her disease. Dr. Marcello Moores has also been aware of her case, but the patient does not recall being  seen formally. She comes today to discuss the rationale for up front chemoRT.  PREVIOUS RADIATION THERAPY: No   PAST MEDICAL HISTORY:  Past Medical History:  Diagnosis Date  . Alcohol abuse   . Allergy    PCNS swelling  . Arthritis   . Bipolar 1 disorder ([place])   . Cancer ([place]) 01/21/2017   rectal cancer  . Cirrhosis of liver ([place])   . Depression   . Hypertension        PAST SURGICAL HISTORY: Past Surgical History:  Procedure Laterality Date  . COLONOSCOPY WITH PROPOFOL Left 01/21/2017   Procedure: COLONOSCOPY WITH PROPOFOL;  Surgeon: Otis Brace, MD;  Location: [facility];  Service: Gastroenterology;  Laterality: Left;  Marland Kitchen MANDIBLE FRACTURE SURGERY       FAMILY HISTORY:  Family History  Problem Relation Age of Onset  . Cancer Mother 38    Originating in the abdomen, otherwise unknwon  . Cancer - Other Maternal Aunt     Throat     SOCIAL HISTORY:  reports that she has been smoking Cigarettes.  She has a 20.50 pack-year smoking history. She has quit using smokeless tobacco. She reports that she drinks about 1.2 oz of alcohol per week . She reports that she uses drugs, including Marijuana and "Crack" cocaine. The patient is single. She lives in [place] with a roommate. She report she does not have any income, but relies on family to help her.    ALLERGIES: Penicillins   MEDICATIONS:  Current Outpatient Prescriptions  Medication Sig Dispense Refill  . ciprofloxacin (  CIPRO) 500 MG tablet Take 1 tablet (500 mg total) by mouth 2 (two) times daily. 14 tablet 0  . ferrous sulfate (QC FERROUS SULFATE) 325 (65 FE) MG tablet Take 1 tablet (325 mg total) by mouth 2 (two) times daily with a meal. 60 tablet 3  . pantoprazole (PROTONIX) 40 MG tablet Take 1 tablet (40 mg total) by mouth daily. 30 tablet 3  . albuterol (PROVENTIL) (2.5 MG/3ML) 0.083% nebulizer solution Take 3 mLs (2.5 mg total) by nebulization every 6 (six) hours as needed for wheezing or shortness of  breath. (Patient not taking: Reported on 01/29/2017) 75 mL 12  . FLUoxetine (PROZAC) 20 MG tablet Take 1 tablet (20 mg total) by mouth daily. (Patient not taking: Reported on 01/29/2017) 30 tablet 3   No current facility-administered medications for this encounter.      REVIEW OF SYSTEMS: On review of systems, the patient reports that she is doing well overall. She denies any chest pain, cough, fevers, chills, night sweats, or unintended weight changes. She reports a small amount of rectal bleeding after bowel movements. She has regular bowel movements. She denies abdominal pain or nausea. She has some SOB with exertion, though this has improved since blood transfusion. She denies any new musculoskeletal or joint aches or pains. A complete review of systems is obtained and is otherwise negative.  PHYSICAL EXAM:  Wt Readings from Last 3 Encounters:  01/29/17 121 lb 4.8 oz (55 kg)  01/26/17 122 lb (55.3 kg)  01/21/17 114 lb 4.8 oz (51.8 kg)   Temp Readings from Last 3 Encounters:  01/29/17 98.3 F (36.8 C) (Oral)  01/26/17 98.2 F (36.8 C) (Oral)  01/23/17 98.3 F (36.8 C) (Oral)   BP Readings from Last 3 Encounters:  01/29/17 (!) 165/84  01/26/17 140/82  01/23/17 (!) 163/83   Pulse Readings from Last 3 Encounters:  01/29/17 94  01/26/17 (!) 128  01/23/17 68   Pain Assessment Pain Score: 0-No pain/10  In general this is a chronically ill appearing Caucasian female in no acute distress. She is alert and oriented x4 and appropriate throughout the examination. HEENT reveals that the patient is normocephalic, atraumatic. EOMs are intact. PERRLA. Skin is intact without any evidence of gross lesions. Cardiovascular exam reveals a regular rate and rhythm, no clicks rubs or murmurs are auscultated. Chest is clear to auscultation bilaterally. Lymphatic assessment is performed and does not reveal any adenopathy in the cervical, supraclavicular, axillary, or inguinal chains. Abdomen has active  bowel sounds in all quadrants and is intact. The abdomen is soft, non tender, non distended. Lower extremities are negative for pretibial pitting edema, deep calf tenderness, cyanosis or clubbing. On rectal exam, there is a palpable bulky posterior rectal tumor starting 2-3 cm above the anal canal.  ECOG = 1  0 - Asymptomatic (Fully active, able to carry on all predisease activities without restriction)  1 - Symptomatic but completely ambulatory (Restricted in physically strenuous activity but ambulatory and able to carry out work of a light or sedentary nature. For example, light housework, office work)  2 - Symptomatic, <50% in bed during the day (Ambulatory and capable of all self care but unable to carry out any work activities. Up and about more than 50% of waking hours)  3 - Symptomatic, >50% in bed, but not bedbound (Capable of only limited self-care, confined to bed or chair 50% or more of waking hours)  4 - Bedbound (Completely disabled. Cannot carry on any self-care. Totally confined  to bed or chair)  5 - Death   Eustace Pen MM, Creech RH, Tormey DC, et al. [phone]). "Toxicity and response criteria of the [facility] Group". [place] Oncol. 5 (6): 649-55    LABORATORY DATA:  Lab Results  Component Value Date   WBC 5.1 01/23/2017   HGB 8.8 (L) 01/23/2017   HCT 29.5 (L) 01/23/2017   MCV 67.7 (L) 01/23/2017   PLT 257 01/23/2017   Lab Results  Component Value Date   NA 137 01/23/2017   K 3.8 01/23/2017   CL 107 01/23/2017   CO2 21 (L) 01/23/2017   Lab Results  Component Value Date   ALT 9 (L) 01/17/2017   AST 19 01/17/2017   ALKPHOS 54 01/17/2017   BILITOT 0.4 01/17/2017      RADIOGRAPHY: Dg Chest 2 View  Result Date: 01/17/2017 CLINICAL DATA:  Fever EXAM: CHEST  2 VIEW COMPARISON:  11/23/2011 FINDINGS: The heart size and mediastinal contours are within normal limits. Both lungs are clear. The visualized skeletal structures are unremarkable.  IMPRESSION: No active cardiopulmonary disease. Electronically Signed   By: Ulyses Jarred M.D.   On: 01/17/2017 02:57   Dg Abd 1 View  Result Date: 01/16/2017 CLINICAL DATA:  Generalized abdominal pain.  Intermittent fever EXAM: ABDOMEN - 1 VIEW COMPARISON:  None. FINDINGS: No dilated to large or small bowel. Moderate volume stool in the colon. No pathologic calcification. No organomegaly. Severe degenerate changes the hips. IMPRESSION: No acute abdominal findings.  Moderate volume stool the colon Electronically Signed   By: Suzy Bouchard M.D.   On: 01/16/2017 18:20   Ct Chest Wo Contrast  Result Date: 01/19/2017 CLINICAL DATA:  Known rectal mass, followup exam EXAM: CT CHEST WITHOUT CONTRAST TECHNIQUE: Multidetector CT imaging of the chest was performed following the standard protocol without IV contrast. COMPARISON:  01/17/2017 FINDINGS: Cardiovascular: Somewhat limited due to the lack of IV contrast. Aortic calcifications are seen as well as coronary and brachiocephalic vessel calcifications. No aneurysmal dilatation is noted. The cardiac enlargement is seen. Mediastinum/Nodes: Thoracic inlet is within normal limits. No hilar or mediastinal adenopathy is seen. Esophagus as visualized shows a small hiatal hernia. Lungs/Pleura: Mild emphysematous changes are noted. No focal infiltrate or sizable effusion is seen. Upper Abdomen: No acute abnormality. Musculoskeletal: No acute bony abnormality noted. IMPRESSION: No acute abnormality noted. Electronically Signed   By: Inez Catalina M.D.   On: 01/19/2017 19:37   Ct Abdomen Pelvis W Contrast  Result Date: 01/17/2017 CLINICAL DATA:  51 year old female with fever and mid epigastric pain EXAM: CT ABDOMEN AND PELVIS WITH CONTRAST TECHNIQUE: Multidetector CT imaging of the abdomen and pelvis was performed using the standard protocol following bolus administration of intravenous contrast. CONTRAST:  100 mL Isovue-300 COMPARISON:  None. FINDINGS: Lower chest:  Minimal dependent atelectasis. The lung bases are otherwise clear. Visualized cardiac structures are within normal limits for size. No pericardial effusion. Unremarkable visualized distal thoracic esophagus. Hepatobiliary: Normal hepatic contour and morphology. No discrete hepatic lesions. Normal appearance of the gallbladder. No intra or extrahepatic biliary ductal dilatation. Pancreas: Unremarkable. No pancreatic ductal dilatation or surrounding inflammatory changes. Spleen: Normal in size without focal abnormality. Adrenals/Urinary Tract: Adrenal glands are unremarkable. Kidneys are normal, without renal calculi, focal lesion, or hydronephrosis. Bladder is unremarkable. Stomach/Bowel: The stomach, small bowel and majority of the colon are within normal limits. However, there is extensive abnormality in the region of the rectum with a large enhancing soft tissue mass measuring approximately 5.7 x  4.1 cm which appears to ex and into the perirectal fat in the left post row lateral (5 o'clock) position. The extra rectal extension measures approximately 4.3 x 5.3 cm. There is moderate inflammatory stranding in the perirectal fat. At least 11 mm low-attenuation peripherally enhancing collection in the perirectal fat may represent a small phlegmon/abscess. No definite adenopathy. Vascular/Lymphatic: Aortic atherosclerosis. No enlarged abdominal or pelvic lymph nodes. Reproductive: Uterus and bilateral adnexa are unremarkable. Other: No abdominal wall hernia or abnormality. No abdominopelvic ascites. Musculoskeletal: No acute fracture or aggressive appearing lytic or blastic osseous lesion. IMPRESSION: 1. Diffusely thickened and irregular rectum with what appears to be a lobular enhancing soft tissue mass extending into the perirectal fat at least 1 small (11 mm) low-attenuation region in the perirectal fat may represent a small abscess and thus evidence of micro perforation. Findings are most consistent with locally  advanced rectal cancer. Severe proctitis with developing adjacent inflammatory phlegmon is also possible but less likely. 2. No definite metastatic adenopathy or distant metastatic disease. 3.  Aortic Atherosclerosis (ICD10-170.0). Electronically Signed   By: Jacqulynn Cadet M.D.   On: 01/17/2017 13:20       IMPRESSION/PLAN: 1. Unstaged Adenocarcinoma of the rectum. Dr. Lisbeth Renshaw discusses the pathology findings and reviews the nature of rectal cancer and locally advanced disease. He discusses the use of chemotherapy concurrently with radiation to treat the tumor, and that surgery would follow these treatments. We will ensure that she has her pelvic MRI for staging purposes as well.  We discussed the risks, benefits, short, and long term effects of radiotherapy, and the patient is interested in proceeding. Dr. Lisbeth Renshaw discusses the delivery and logistics of radiotherapy. Dr. Lisbeth Renshaw recommends a course of 5 1/2 weeks of treatment. Written consent was signed and placed in the chart. We will proceed tomorrow with simulation, and anticipate beginning treatment the week of 02/20/17. 2. Possible genetic predisposition for malignancy. Given her personal and family history, we will refer her to genetic counseling. 3. Social needs and polysubstance abuse. We will refer her for social work assessment. She will also need significant support for transportation and coordination of Medicaid application.   The above documentation reflects my direct findings during this shared patient visit. Please see the separate note by Dr. Lisbeth Renshaw on this date for the remainder of the patient's plan of care.    Carola Rhine, PAC  This document serves as a record of services personally performed by Shona Simpson, PA-C and Kyung Rudd, MD. It was created on their behalf by Darcus Austin, a trained medical scribe. The creation of this record is based on the scribe's personal observations and the providers' statements to them. This  document has been checked and approved by the attending provider.

## 2017-01-30 ENCOUNTER — Ambulatory Visit: Payer: Medicaid Other | Admitting: Radiation Oncology

## 2017-02-02 ENCOUNTER — Other Ambulatory Visit (HOSPITAL_BASED_OUTPATIENT_CLINIC_OR_DEPARTMENT_OTHER): Payer: Self-pay

## 2017-02-02 ENCOUNTER — Other Ambulatory Visit: Payer: Self-pay

## 2017-02-02 ENCOUNTER — Encounter: Payer: Self-pay | Admitting: *Deleted

## 2017-02-02 DIAGNOSIS — C20 Malignant neoplasm of rectum: Secondary | ICD-10-CM

## 2017-02-02 LAB — COMPREHENSIVE METABOLIC PANEL
ALBUMIN: 3.2 g/dL — AB (ref 3.5–5.0)
ALT: 15 U/L (ref 0–55)
AST: 28 U/L (ref 5–34)
Alkaline Phosphatase: 86 U/L (ref 40–150)
Anion Gap: 11 mEq/L (ref 3–11)
BUN: 6 mg/dL — AB (ref 7.0–26.0)
CHLORIDE: 105 meq/L (ref 98–109)
CO2: 25 mEq/L (ref 22–29)
Calcium: 9 mg/dL (ref 8.4–10.4)
Creatinine: 0.6 mg/dL (ref 0.6–1.1)
EGFR: >90 mL/min/{1.73_m2} (ref >90)
GLUCOSE: 86 mg/dL (ref 70–140)
POTASSIUM: 4 meq/L (ref 3.5–5.1)
Sodium: 140 mEq/L (ref 136–145)
Total Bilirubin: 0.22 mg/dL (ref 0.20–1.20)
Total Protein: 7.7 g/dL (ref 6.4–8.3)

## 2017-02-02 LAB — CBC WITH DIFFERENTIAL/PLATELET
BASO%: 0.2 % (ref 0.0–2.0)
BASOS ABS: 0 10*3/uL (ref 0.0–0.1)
EOS%: 2.2 % (ref 0.0–7.0)
Eosinophils Absolute: 0.1 10*3/uL (ref 0.0–0.5)
HCT: 33.5 % — ABNORMAL LOW (ref 34.8–46.6)
HGB: 10.2 g/dL — ABNORMAL LOW (ref 11.6–15.9)
LYMPH%: 37 % (ref 14.0–49.7)
MCH: 21.3 pg — ABNORMAL LOW (ref 25.1–34.0)
MCHC: 30.4 g/dL — AB (ref 31.5–36.0)
MCV: 69.8 fL — ABNORMAL LOW (ref 79.5–101.0)
MONO#: 0.5 10*3/uL (ref 0.1–0.9)
MONO%: 7.1 % (ref 0.0–14.0)
NEUT#: 3.4 10*3/uL (ref 1.5–6.5)
NEUT%: 53.5 % (ref 38.4–76.8)
Platelets: 268 10*3/uL (ref 145–400)
RBC: 4.8 10*6/uL (ref 3.70–5.45)
RDW: 29.4 % — AB (ref 11.2–14.5)
WBC: 6.3 10*3/uL (ref 3.9–10.3)
lymph#: 2.3 10*3/uL (ref 0.9–3.3)

## 2017-02-02 LAB — CEA (IN HOUSE-CHCC): CEA (CHCC-In House): 7.83 ng/mL — ABNORMAL HIGH (ref 0.00–5.00)

## 2017-02-02 NOTE — Progress Notes (Signed)
[place] Work  Clinical Social Work was referred by Futures trader for assessment of psychosocial needs.  Clinical Social Worker met with patient in [place] office at [facility] to offer support and assess for needs.  Patient stated she was feeling overwhelmed, but "doing ok" .  CSW and patient discussed support services and CSW role.  Patient expressed transportation and financial concerns.  CSW and patient discussed transportation resources avaliable and completed a SCAT application.  CSW submitted application; SCAT office will contact patient once it is reviewed.  CSW also provided patient with information on Cancer Care and how to initiate the financial assistance application process.  Patient verbalized understanding and plans to contact Cancer care.  CSW also encouraged patient to contact the financial advocate to discuss her Medicaid application and apply for the [place].  CSW provided contact information for CSW and financial advocate.  CSW encouraged patient to call with questions or concerns.           Johnnye Lana, MSW, LCSW, OSW-C Clinical Social Worker [facility] [phone]

## 2017-02-03 ENCOUNTER — Telehealth: Payer: Self-pay | Admitting: Pharmacist

## 2017-02-03 NOTE — Telephone Encounter (Signed)
Oral Chemotherapy Pharmacist Encounter  Received new prescription for Xeloda for patient to be taken in conjunction with XRT for locally advanced rectal cancer.  Labs from 02/02/17 reviewed, OK for treatment  Current medication list in Epic assessed, some DDIs with Xeloda identified:  Xeloda and Prozac: Category D interaction, manufacturer recommends considering changing treatment due to increase risk of QTc prolongation. Prozac categorized as a high-risk QTc prolonging agent, Xeloda classified as indeterminate risk. It does not appear that patient is actually taking Prozac at the moment, however, she does have an active Rx for it and has been on and off it since 2015. EKGs in Epic reviewed, patient has consistently prolonged QTc intervals, repeat EKGs may be worth considering during treatment:  01/17/17: QTc = 464 msec  02/12/14: QTc = 474 msec  11/27/11: QTc = 463 msec  Xeloda and pantoprazole: Category C interaction, no change to therapy indicated at this time. There is conflicting data surrounding use of Xeloda concurrently with a PPI. Some retrospective data suggest decrease in PFS in lover stage patient's on both medications, however, when these analysis are performed again, adjusting for confounders, no significant changes in efficacy endpoints are noted.  Noted patient without prescription insurance coverage. Patient has been encouraged to meet with a [facility] [place] financial advocate to apply for the [place].  Prescription will be sent to the [facility] for benefits analysis. Oral Oncology Clinic will continue to follow.  Johny Drilling, PharmD, BCPS, BCOP 02/03/2017  8:28 AM Oral Oncology Clinic [phone]

## 2017-02-09 ENCOUNTER — Ambulatory Visit
Admission: RE | Admit: 2017-02-09 | Discharge: 2017-02-09 | Disposition: A | Discharge status: Home / Self Care | Payer: Medicaid Other | Source: Ambulatory Visit | Attending: Radiation Oncology | Admitting: Radiation Oncology

## 2017-02-09 DIAGNOSIS — Z510 Encounter for antineoplastic radiation therapy: Secondary | ICD-10-CM | POA: Diagnosis not present

## 2017-02-09 MED ORDER — OXYCODONE-ACETAMINOPHEN 5-325 MG PO TABS: 1.0000 | ORAL_TABLET | Freq: Four times a day (QID) | ORAL | 0 refills | Status: DC | PRN

## 2017-02-10 ENCOUNTER — Encounter: Payer: Self-pay | Admitting: *Deleted

## 2017-02-10 ENCOUNTER — Ambulatory Visit (HOSPITAL_COMMUNITY)
Admission: RE | Admit: 2017-02-10 | Discharge: 2017-02-10 | Disposition: A | Discharge status: Home / Self Care | Payer: Self-pay | Source: Ambulatory Visit | Attending: General Surgery | Admitting: General Surgery

## 2017-02-10 DIAGNOSIS — C20 Malignant neoplasm of rectum: Secondary | ICD-10-CM

## 2017-02-10 NOTE — Progress Notes (Signed)
[place] Work  Holiday representative met with patient in [place] office at [employer].  CSW and patient reviewed Cancer Care application.  Once patient provides letter of support/income information, CSW will submit application.  Patient also reported that she used SCAT transportation to her appointment.  CSW provided patient with a SCAT pass to use for transportation to treatment.  CSW and patient also discussed applying for the [place].  CSW contacted Estate manager/land agent who plans to see patient at her next appointment.  CSW encouraged patient to call with questions or concerns.    Johnnye Lana, MSW, LCSW, OSW-C Clinical Social Worker [facility] [phone]

## 2017-02-11 MED FILL — OXYCODONE/APAP 5/325 MG TAB: 5-325 | 5 days supply | Qty: 40 | Fill #0

## 2017-02-11 MED FILL — XELODA 500 MG TABLET: 500 | 39 days supply | Qty: 140 | Fill #0

## 2017-02-12 ENCOUNTER — Ambulatory Visit (HOSPITAL_COMMUNITY)
Admission: RE | Admit: 2017-02-12 | Discharge: 2017-02-12 | Disposition: A | Discharge status: Home / Self Care | Payer: Self-pay | Source: Ambulatory Visit | Attending: General Surgery | Admitting: General Surgery

## 2017-02-12 DIAGNOSIS — Z510 Encounter for antineoplastic radiation therapy: Secondary | ICD-10-CM | POA: Diagnosis not present

## 2017-02-16 ENCOUNTER — Ambulatory Visit: Payer: Medicaid Other | Admitting: Radiation Oncology

## 2017-02-17 ENCOUNTER — Ambulatory Visit (HOSPITAL_BASED_OUTPATIENT_CLINIC_OR_DEPARTMENT_OTHER): Payer: Self-pay | Admitting: Genetics

## 2017-02-17 ENCOUNTER — Ambulatory Visit
Admission: RE | Admit: 2017-02-17 | Discharge: 2017-02-17 | Disposition: A | Discharge status: Home / Self Care | Payer: Medicaid Other | Source: Ambulatory Visit | Attending: Radiation Oncology | Admitting: Radiation Oncology

## 2017-02-17 ENCOUNTER — Other Ambulatory Visit: Payer: Self-pay

## 2017-02-17 DIAGNOSIS — Z808 Family history of malignant neoplasm of other organs or systems: Secondary | ICD-10-CM

## 2017-02-17 DIAGNOSIS — Z315 Encounter for genetic counseling: Secondary | ICD-10-CM

## 2017-02-17 DIAGNOSIS — C20 Malignant neoplasm of rectum: Secondary | ICD-10-CM

## 2017-02-17 DIAGNOSIS — Z510 Encounter for antineoplastic radiation therapy: Secondary | ICD-10-CM | POA: Diagnosis not present

## 2017-02-17 DIAGNOSIS — Z809 Family history of malignant neoplasm, unspecified: Secondary | ICD-10-CM

## 2017-02-17 NOTE — Progress Notes (Signed)
REFERRING PROVIDER: Hayden Pedro, PA-C [place], [place] [address]  PRIMARY PROVIDER:  No PCP Per Patient  PRIMARY REASON FOR VISIT:  1. Rectal cancer ([place])   2. Family history of cancer      HISTORY OF PRESENT ILLNESS:   Sherry Baker, a 51 y.o. female, was seen for a [facility] cancer genetics consultation at the request of Shona Simpson, PA-C due to a personal history of rectal cancer diagnosed at age 18.  Sherry Baker presents to clinic today to discuss the possibility of a hereditary predisposition to cancer, genetic testing, and to further clarify her future cancer risks, as well as potential cancer risks for family members.   In March 2018, at the age of 46, Sherry Baker was diagnosed with rectal cancer. She is currently undergoing neoadjuvant chemoradiation.    CANCER HISTORY:    Rectal cancer ([place])   01/17/2017 Imaging    CT abdomen and pelvis with contrast IMPRESSION: 1. Diffusely thickened and irregular rectum with what appears to be a lobular enhancing soft tissue mass extending into the perirectal fat at least 1 small (11 mm) low-attenuation region in the perirectal fat may represent a small abscess and thus evidence of micro perforation. Findings are most consistent with locally advanced rectal cancer. Severe proctitis with developing adjacent inflammatory phlegmon is also possible but less likely. 2. No definite metastatic adenopathy or distant metastatic disease. 3. Aortic Atherosclerosis       01/19/2017 Imaging    CT chest without contrast showed no acute abnormality      01/21/2017 Initial Diagnosis    Rectal cancer ([facility])      01/21/2017 Procedure    Colonoscopy by Dr. Alessandra Bevels showed a 5 cm infiltrative ulcerated partially circumferential mass in the rectum, non-obstructing, diverticulosis in the sigmoid colon, one 7 mm polyps in the sigmoid colon removed.      01/21/2017 Initial Biopsy    Rectal mass biopsy showed adenocarcinoma        Past Medical History:  Diagnosis Date  . Alcohol abuse   . Allergy    PCNS swelling  . Arthritis   . Bipolar 1 disorder ([facility])   . Cancer ([facility]) 01/21/2017   rectal cancer  . Cirrhosis of liver ([facility])   . Depression   . Hypertension     Past Surgical History:  Procedure Laterality Date  . COLONOSCOPY WITH PROPOFOL Left 01/21/2017   Procedure: COLONOSCOPY WITH PROPOFOL;  Surgeon: Otis Brace, MD;  Location: [facility];  Service: Gastroenterology;  Laterality: Left;  Marland Kitchen MANDIBLE FRACTURE SURGERY      Social History   Social History  . Marital status: Single    Spouse name: N/A  . Number of children: N/A  . Years of education: N/A   Social History Main Topics  . Smoking status: Current Every Day Smoker    Packs/day: 0.50    Years: 41.00    Types: Cigarettes  . Smokeless tobacco: Former Systems developer     Comment: patient refused  . Alcohol use 1.2 oz/week    2 Cans of beer per week     Comment: 40oz. beer x 2 everyday  . Drug use: Yes    Types: Marijuana, "Crack" cocaine     Comment: patient states "once in a blue moon"  . Sexual activity: Not Currently    Birth control/ protection: Abstinence   Other Topics Concern  . Not on file   Social History Narrative  . No narrative on file  FAMILY HISTORY:  We obtained a detailed, 4-generation family history.  Significant diagnoses are listed below: Family History  Problem Relation Age of Onset  . Cancer Mother 66    Originating in the abdomen, otherwise unknwon  . Cancer - Other Maternal Aunt     Throat   Sherry Baker has two sons, ages 94 and 31. However, information regarding their health is very limited. She reports that her sons were removed from her custody when her oldest was 29 and the youngest was a newborn and she has no further information about them. Sherry Baker is the only child between her parents. She has a maternal half-sister who died of AIDS in her 18s. She also has a paternal half-brother who was shot  at age 13. Neither had cancers. Her brother did not have children. Her sister had one son who is currently in his early-20s.  Sherry Baker mother died at age 26 and had a history of "stomach" cancer. Her mother had 7 brothers and one sister. Her sister had throat cancer and died in her 82s. There are no known cancers in her brothers.  Sherry Baker father died at 66 without known cancers. He had 4 brothers and 5 sisters. Information is limited regarding these family members, but none are known to have cancers.  There are no known cancers in Sherry Baker's grandparents.  Sherry Baker is unaware of previous family history of genetic testing for hereditary cancer risks. Patient's maternal and paternal ancestors are of Caucasian descent. There is no reported Ashkenazi Jewish ancestry. There is no known consanguinity.  GENETIC COUNSELING ASSESSMENT: Sherry Baker is a 51 y.o. female with a personal history of rectal cancer diagnosed under age 72 which is somewhat suggestive of Lynch syndrome and predisposition to colorectal and other cancers. We, therefore, discussed and recommended the following at today's visit.   DISCUSSION: We reviewed the characteristics, features and inheritance patterns of hereditary cancer syndromes. We also discussed genetic testing, including the appropriate family members to test, the process of testing, insurance coverage and turn-around-time for results. We discussed the implications of a negative, positive and/or variant of uncertain significant result. We recommended Sherry Baker pursue genetic testing for the  46-gene Common Hereditary Cancers panel offered by [facility]. [facility]'s Common Hereditary Cancers Panel includes analysis of the following 46 genes: APC, ATM, AXIN2, BARD1, BMPR1A, BRCA1, BRCA2, BRIP1, CDH1, CDKN2A, CHEK2, CTNNA1, DICER1, EPCAM, GREM1, HOXB13, KIT, MEN1, MLH1, MSH2, MSH3, MSH6, MUTYH, NBN, NF1, NTHL1, PALB2, PDGFRA, PMS2, POLD1, POLE, PTEN, RAD50, RAD51C, RAD51D,  SDHA, SDHB, SDHC, SDHD, SMAD4, SMARCA4, STK11, TP53, TSC1, TSC2, and VHL  A financial assistance application was submitted to [facility]. If her application is accepted, Sherry Baker will have no out-of-pocket expense for her testing. If it is not accepted, [facility] will notify Sherry Baker.  PLAN: After considering the risks, benefits, and limitations, Sherry Baker  provided informed consent to pursue genetic testing and the blood sample was sent to [facility] for analysis of the 46-gene Common Hereditary Cancers Panel. Results should be available within approximately 3 weeks' time, at which point they will be disclosed by telephone to Sherry Baker, as will any additional recommendations warranted by these results. Sherry Baker will receive a summary of her genetic counseling visit and a copy of her results once available. This information will also be available in Epic.   Lastly, we encouraged Sherry Baker to remain in contact with cancer genetics annually so that we can continuously update the family history and inform  her of any changes in cancer genetics and testing that may be of benefit for this family.   Ms.  Baker questions were answered to her satisfaction today. Our contact information was provided should additional questions or concerns arise. Thank you for the referral and allowing [place] to share in the care of your patient.   Mal Misty, MS, [facility] Certified Naval [email or web address] phone: [phone]  The patient was seen for a total of 30 minutes in face-to-face genetic counseling.  This patient was discussed with Drs. Magrinat, Lindi Adie and/or Burr Medico who agrees with the above.    _______________________________________________________________________ For Office Staff:  Number of people involved in session: 1 Was an Intern/ student involved with case: no

## 2017-02-18 ENCOUNTER — Encounter: Payer: Self-pay | Admitting: Family Medicine

## 2017-02-18 ENCOUNTER — Ambulatory Visit (HOSPITAL_COMMUNITY)
Admission: RE | Admit: 2017-02-18 | Discharge: 2017-02-18 | Disposition: A | Discharge status: Home / Self Care | Payer: Medicaid Other | Source: Ambulatory Visit | Attending: General Surgery | Admitting: General Surgery

## 2017-02-18 ENCOUNTER — Ambulatory Visit
Admission: RE | Admit: 2017-02-18 | Discharge: 2017-02-18 | Disposition: A | Discharge status: Home / Self Care | Payer: Medicaid Other | Source: Ambulatory Visit | Attending: Radiation Oncology | Admitting: Radiation Oncology

## 2017-02-18 ENCOUNTER — Ambulatory Visit: Payer: Medicaid Other | Attending: Family Medicine | Admitting: Family Medicine

## 2017-02-18 VITALS — BP 142/90 | HR 117 | Temp 97.7°F | Ht 62.0 in | Wt 115.0 lb

## 2017-02-18 DIAGNOSIS — F3175 Bipolar disorder, in partial remission, most recent episode depressed: Secondary | ICD-10-CM

## 2017-02-18 DIAGNOSIS — I10 Essential (primary) hypertension: Secondary | ICD-10-CM

## 2017-02-18 DIAGNOSIS — Z79899 Other long term (current) drug therapy: Secondary | ICD-10-CM | POA: Diagnosis not present

## 2017-02-18 DIAGNOSIS — F319 Bipolar disorder, unspecified: Secondary | ICD-10-CM | POA: Insufficient documentation

## 2017-02-18 DIAGNOSIS — F419 Anxiety disorder, unspecified: Secondary | ICD-10-CM | POA: Diagnosis not present

## 2017-02-18 DIAGNOSIS — Z880 Allergy status to penicillin: Secondary | ICD-10-CM | POA: Diagnosis not present

## 2017-02-18 DIAGNOSIS — K219 Gastro-esophageal reflux disease without esophagitis: Secondary | ICD-10-CM

## 2017-02-18 DIAGNOSIS — Z510 Encounter for antineoplastic radiation therapy: Secondary | ICD-10-CM | POA: Diagnosis not present

## 2017-02-18 DIAGNOSIS — C20 Malignant neoplasm of rectum: Secondary | ICD-10-CM | POA: Diagnosis not present

## 2017-02-18 DIAGNOSIS — R938 Abnormal findings on diagnostic imaging of other specified body structures: Secondary | ICD-10-CM | POA: Insufficient documentation

## 2017-02-18 DIAGNOSIS — R000 Tachycardia, unspecified: Secondary | ICD-10-CM | POA: Insufficient documentation

## 2017-02-18 MED ORDER — PANTOPRAZOLE SODIUM 40 MG PO TBEC: 40.0000 mg | DELAYED_RELEASE_TABLET | Freq: Every day | ORAL | 3 refills | Status: DC

## 2017-02-18 MED ORDER — CARVEDILOL 6.25 MG PO TABS: 6.2500 mg | ORAL_TABLET | Freq: Two times a day (BID) | ORAL | 3 refills | Status: DC

## 2017-02-18 MED ORDER — GADOBENATE DIMEGLUMINE 529 MG/ML IV SOLN
10.0000 mL | Freq: Once | INTRAVENOUS | Status: AC | PRN
  Administered 2017-02-18: 10 mL via INTRAVENOUS

## 2017-02-18 MED ORDER — FLUOXETINE HCL 20 MG PO TABS: 20.0000 mg | ORAL_TABLET | Freq: Every day | ORAL | 3 refills | Status: DC

## 2017-02-18 NOTE — Progress Notes (Signed)
Subjective:  Patient ID: Sherry Baker, female    DOB: November 01, 1966  Age: 51 y.o. MRN: [id number]  CC: Rectal Cancer and Hypertension   HPI Sherry Baker is a 51 year old female with a history of hypertension, bipolar disorder, tobacco abuse with recently diagnosed rectal carcinoma (currently on chemotherapy and radiation)  She was admitted to [facility] after presenting with hematochezia, hemoglobin was 7.5 requiring transfusion with 2 units of PRBC;  CT abdomen and pelvis revealed findings consistent with locally advanced rectal cancer, severe proctitis with developing adjacent inflammatory phlegmon, no definite metastatic adenopathy or distant metastatic disease.  She commenced chemotherapy yesterday and is scheduled for radiation today.  Her blood pressure was elevated at her last office visit but she was not commenced on an antihypertensive; her blood pressure is still elevated today and she is tachycardic. She complains of pain in her rectum and anxiety over the rectal cancer. She does have pain medications which she received from oncology.  Would like all her medications sent to [facility].  Past Medical History:  Diagnosis Date  . Alcohol abuse   . Allergy    PCNS swelling  . Arthritis   . Bipolar 1 disorder ([facility])   . Cancer ([place]) 01/21/2017   rectal cancer  . Cirrhosis of liver ([place])   . Depression   . Hypertension     Past Surgical History:  Procedure Laterality Date  . COLONOSCOPY WITH PROPOFOL Left 01/21/2017   Procedure: COLONOSCOPY WITH PROPOFOL;  Surgeon: Otis Brace, MD;  Location: [facility];  Service: Gastroenterology;  Laterality: Left;  Marland Kitchen MANDIBLE FRACTURE SURGERY      Allergies  Allergen Reactions  . Penicillins Hives and Swelling    Has patient had a PCN reaction causing immediate rash, facial/tongue/throat swelling, SOB or lightheadedness with hypotension: Yes Has patient had a PCN reaction causing severe rash involving mucus  membranes or skin necrosis: No Has patient had a PCN reaction that required hospitalization No Has patient had a PCN reaction occurring within the last 10 years: Yes If all of the above answers are "NO", then may proceed with Cephalosporin use.     Outpatient Medications Prior to Visit  Medication Sig Dispense Refill  . ferrous sulfate (QC FERROUS SULFATE) 325 (65 FE) MG tablet Take 1 tablet (325 mg total) by mouth 2 (two) times daily with a meal. 60 tablet 3  . oxyCODONE-acetaminophen (PERCOCET/ROXICET) 5-325 MG tablet Take 1-2 tablets by mouth every 6 (six) hours as needed for severe pain. 40 tablet 0  . pantoprazole (PROTONIX) 40 MG tablet Take 1 tablet (40 mg total) by mouth daily. 30 tablet 3  . albuterol (PROVENTIL) (2.5 MG/3ML) 0.083% nebulizer solution Take 3 mLs (2.5 mg total) by nebulization every 6 (six) hours as needed for wheezing or shortness of breath. (Patient not taking: Reported on 01/29/2017) 75 mL 12  . FLUoxetine (PROZAC) 20 MG tablet Take 1 tablet (20 mg total) by mouth daily. (Patient not taking: Reported on 01/29/2017) 30 tablet 3   No facility-administered medications prior to visit.     ROS Review of Systems  Constitutional: Negative for activity change, appetite change and fatigue.  HENT: Negative for congestion, sinus pressure and sore throat.   Eyes: Negative for visual disturbance.  Respiratory: Negative for cough, chest tightness, shortness of breath and wheezing.   Cardiovascular: Negative for chest pain and palpitations.  Gastrointestinal: Positive for rectal pain. Negative for abdominal distention, abdominal pain and constipation.  Endocrine: Negative for polydipsia.  Genitourinary:  Negative for dysuria and frequency.  Musculoskeletal: Negative for arthralgias and back pain.  Skin: Negative for rash.  Neurological: Negative for tremors, light-headedness and numbness.  Hematological: Does not bruise/bleed easily.  Psychiatric/Behavioral: Negative for  agitation and behavioral problems.       Positive for anxiety    Objective:  BP (!) 142/90 (BP Location: Left Arm, Patient Position: Sitting, Cuff Size: Small)   Pulse (!) 117   Temp 97.7 F (36.5 C) (Oral)   Ht 5\' 2"  (1.575 m)   Wt 115 lb (52.2 kg)   SpO2 99%   BMI 21.03 kg/m   BP/Weight 02/18/2017 01/29/2017 12/29/3333  Systolic BP 456 256 389  Diastolic BP 90 84 82  Wt. (Lbs) 115 121.3 122  BMI 21.03 21.49 21.61      Physical Exam  Constitutional: She is oriented to person, place, and time. She appears well-developed and well-nourished.  Cardiovascular: Normal rate, normal heart sounds and intact distal pulses.   No murmur heard. Pulmonary/Chest: Effort normal and breath sounds normal. She has no wheezes. She has no rales. She exhibits no tenderness.  Abdominal: Soft. Bowel sounds are normal. She exhibits no distension and no mass. There is no tenderness.  Musculoskeletal: Normal range of motion.  Neurological: She is alert and oriented to person, place, and time.     Assessment & Plan:   1. Rectal cancer [facility]) Currently on chemotherapy and radiation Keep follow-up with oncology  2. Essential hypertension Uncontrolled Low-sodium diet Commence carvedilol which will also help with tachycardia - carvedilol (COREG) 6.25 MG tablet; Take 1 tablet (6.25 mg total) by mouth 2 (two) times daily with a meal.  Dispense: 60 tablet; Refill: 3  3. Gastroesophageal reflux disease without esophagitis Stable - pantoprazole (PROTONIX) 40 MG tablet; Take 1 tablet (40 mg total) by mouth daily.  Dispense: 30 tablet; Refill: 3  4. Bipolar disorder, in partial remission, most recent episode depressed [facility]) Patient is yet to pick up medications-advised to do so. - FLUoxetine (PROZAC) 20 MG tablet; Take 1 tablet (20 mg total) by mouth daily.  Dispense: 30 tablet; Refill: 3   Meds ordered this encounter  Medications  . carvedilol (COREG) 6.25 MG tablet    Sig: Take 1 tablet (6.25 mg  total) by mouth 2 (two) times daily with a meal.    Dispense:  60 tablet    Refill:  3  . FLUoxetine (PROZAC) 20 MG tablet    Sig: Take 1 tablet (20 mg total) by mouth daily.    Dispense:  30 tablet    Refill:  3  . pantoprazole (PROTONIX) 40 MG tablet    Sig: Take 1 tablet (40 mg total) by mouth daily.    Dispense:  30 tablet    Refill:  3    Follow-up: Return in about 1 month (around 03/20/2017) for Follow-up of hypertension.   Arnoldo Morale MD

## 2017-02-19 ENCOUNTER — Ambulatory Visit
Admission: RE | Admit: 2017-02-19 | Discharge: 2017-02-19 | Disposition: A | Discharge status: Home / Self Care | Payer: Medicaid Other | Source: Ambulatory Visit | Attending: Radiation Oncology | Admitting: Radiation Oncology

## 2017-02-19 DIAGNOSIS — Z510 Encounter for antineoplastic radiation therapy: Secondary | ICD-10-CM | POA: Diagnosis not present

## 2017-02-20 ENCOUNTER — Other Ambulatory Visit: Payer: Self-pay

## 2017-02-20 ENCOUNTER — Ambulatory Visit: Payer: Medicaid Other

## 2017-02-20 ENCOUNTER — Ambulatory Visit: Payer: Self-pay | Admitting: Nurse Practitioner

## 2017-02-20 MED FILL — PANTOPRAZOLE SOD DR 40 MG T: 40 | 30 days supply | Qty: 30 | Fill #0

## 2017-02-20 MED FILL — CARVEDILOL 6.25 MG TABLET: 6.25 | 30 days supply | Qty: 60 | Fill #0

## 2017-02-20 MED FILL — ?ALBUTEROL SUL 2.5 MG/3 MLS: (2.5 MG/3ML | 8 days supply | Qty: 90 | Fill #0

## 2017-02-20 MED FILL — FLUoxetine HCL 20 MG CAPS: 20 | 30 days supply | Qty: 30 | Fill #0

## 2017-02-23 ENCOUNTER — Ambulatory Visit
Admission: RE | Admit: 2017-02-23 | Discharge: 2017-02-23 | Disposition: A | Discharge status: Home / Self Care | Payer: Medicaid Other | Source: Ambulatory Visit | Attending: Radiation Oncology | Admitting: Radiation Oncology

## 2017-02-23 DIAGNOSIS — Z510 Encounter for antineoplastic radiation therapy: Secondary | ICD-10-CM | POA: Diagnosis not present

## 2017-02-24 ENCOUNTER — Ambulatory Visit: Payer: Self-pay | Admitting: Nurse Practitioner

## 2017-02-24 ENCOUNTER — Other Ambulatory Visit: Payer: Self-pay

## 2017-02-24 ENCOUNTER — Ambulatory Visit
Admission: RE | Admit: 2017-02-24 | Discharge: 2017-02-24 | Disposition: A | Discharge status: Home / Self Care | Payer: Medicaid Other | Source: Ambulatory Visit | Attending: Radiation Oncology | Admitting: Radiation Oncology

## 2017-02-24 ENCOUNTER — Telehealth: Payer: Self-pay | Admitting: Oncology

## 2017-02-24 DIAGNOSIS — Z510 Encounter for antineoplastic radiation therapy: Secondary | ICD-10-CM | POA: Diagnosis not present

## 2017-02-24 NOTE — Telephone Encounter (Signed)
Patient stopped by after xrt re lab/fu this afternoon. Per patient she will not be able to return and per patient request lab/fu rescheduled to 4/27 after xrt. LT aware.

## 2017-02-25 ENCOUNTER — Ambulatory Visit
Admission: RE | Admit: 2017-02-25 | Discharge: 2017-02-25 | Disposition: A | Discharge status: Home / Self Care | Payer: Medicaid Other | Source: Ambulatory Visit | Attending: Radiation Oncology | Admitting: Radiation Oncology

## 2017-02-25 DIAGNOSIS — Z510 Encounter for antineoplastic radiation therapy: Secondary | ICD-10-CM | POA: Diagnosis not present

## 2017-02-26 ENCOUNTER — Ambulatory Visit: Payer: Medicaid Other

## 2017-02-27 ENCOUNTER — Telehealth: Payer: Self-pay | Admitting: Nurse Practitioner

## 2017-02-27 ENCOUNTER — Ambulatory Visit (HOSPITAL_BASED_OUTPATIENT_CLINIC_OR_DEPARTMENT_OTHER): Payer: Self-pay | Admitting: Nurse Practitioner

## 2017-02-27 ENCOUNTER — Other Ambulatory Visit: Payer: Self-pay | Admitting: Radiation Oncology

## 2017-02-27 ENCOUNTER — Ambulatory Visit
Admission: RE | Admit: 2017-02-27 | Discharge: 2017-02-27 | Disposition: A | Discharge status: Home / Self Care | Payer: Medicaid Other | Source: Ambulatory Visit | Attending: Radiation Oncology | Admitting: Radiation Oncology

## 2017-02-27 ENCOUNTER — Other Ambulatory Visit (HOSPITAL_BASED_OUTPATIENT_CLINIC_OR_DEPARTMENT_OTHER): Payer: Self-pay

## 2017-02-27 VITALS — BP 155/82 | HR 80 | Temp 98.2°F | Resp 18 | Ht 62.0 in | Wt 114.0 lb

## 2017-02-27 DIAGNOSIS — Z720 Tobacco use: Secondary | ICD-10-CM

## 2017-02-27 DIAGNOSIS — Z510 Encounter for antineoplastic radiation therapy: Secondary | ICD-10-CM | POA: Diagnosis not present

## 2017-02-27 DIAGNOSIS — C20 Malignant neoplasm of rectum: Secondary | ICD-10-CM

## 2017-02-27 DIAGNOSIS — K6289 Other specified diseases of anus and rectum: Secondary | ICD-10-CM

## 2017-02-27 DIAGNOSIS — D509 Iron deficiency anemia, unspecified: Secondary | ICD-10-CM

## 2017-02-27 DIAGNOSIS — Z7289 Other problems related to lifestyle: Secondary | ICD-10-CM

## 2017-02-27 LAB — CBC WITH DIFFERENTIAL/PLATELET
BASO%: 0.2 % (ref 0.0–2.0)
Basophils Absolute: 0 10*3/uL (ref 0.0–0.1)
EOS%: 1.7 % (ref 0.0–7.0)
Eosinophils Absolute: 0.1 10*3/uL (ref 0.0–0.5)
HEMATOCRIT: 34.3 % — AB (ref 34.8–46.6)
HEMOGLOBIN: 10.9 g/dL — AB (ref 11.6–15.9)
LYMPH#: 1 10*3/uL (ref 0.9–3.3)
LYMPH%: 16.5 % (ref 14.0–49.7)
MCH: 25.3 pg (ref 25.1–34.0)
MCHC: 31.8 g/dL (ref 31.5–36.0)
MCV: 79.6 fL (ref 79.5–101.0)
MONO#: 0.6 10*3/uL (ref 0.1–0.9)
MONO%: 9.7 % (ref 0.0–14.0)
NEUT%: 71.9 % (ref 38.4–76.8)
NEUTROS ABS: 4.5 10*3/uL (ref 1.5–6.5)
Platelets: 182 10*3/uL (ref 145–400)
RBC: 4.31 10*6/uL (ref 3.70–5.45)
RDW: 31.1 % — ABNORMAL HIGH (ref 11.2–14.5)
WBC: 6.3 10*3/uL (ref 3.9–10.3)

## 2017-02-27 MED ORDER — PROCHLORPERAZINE MALEATE 10 MG PO TABS: 10.0000 mg | ORAL_TABLET | Freq: Four times a day (QID) | ORAL | 1 refills | Status: DC | PRN

## 2017-02-27 MED FILL — PROCHLORPERAZINE 10 MG TAB: 10 | 10 days supply | Qty: 40 | Fill #0

## 2017-02-27 NOTE — Telephone Encounter (Signed)
Gave patient AVS and calender per 4/27 los.  

## 2017-02-27 NOTE — Progress Notes (Signed)
  [facility] OFFICE PROGRESS NOTE   Diagnosis:  Rectal cancer  INTERVAL HISTORY:   Sherry Baker returns as scheduled. She began radiation/Xeloda 02/17/2017. She has occasional mild nausea. No mouth sores. No diarrhea. No hand or foot pain or redness. She continues to have pain at the rectum. She takes Percocet as needed. She notes a small amount of blood from the rectum periodically.   Objective:  Vital signs in last 24 hours:  Blood pressure (!) 155/82, pulse 80, temperature 98.2 F (36.8 C), temperature source Oral, resp. rate 18, height 5\' 2"  (1.575 m), weight 114 lb (51.7 kg), SpO2 99 %.    HEENT: No thrush or ulcers. Resp: Lungs clear bilaterally. Cardio: Regular rate and rhythm. GI: Abdomen soft and nontender, No hepatomegaly. Vascular: No leg edema. Calves soft and nontender. Skin: Palms without erythema.     Lab Results:  Lab Results  Component Value Date   WBC 6.3 02/27/2017   HGB 10.9 (L) 02/27/2017   HCT 34.3 (L) 02/27/2017   MCV 79.6 02/27/2017   PLT 182 02/27/2017   NEUTROABS 4.5 02/27/2017    Imaging:  No results found.  Medications: I have reviewed the patient's current medications.  Assessment/Plan: 1. Rectal cancer-distal rectal mass noted on colonoscopy 01/21/2017 with a biopsy confirming adenocarcinoma  ? CT abdomen/pelvis 01/17/2017 and CT chest 01/19/2017 with no evidence of distant metastatic disease ? Radiation/Xeloda initiated 02/17/2017  2.   Iron deficiency anemia secondary to #1  3.   Alcohol and tobacco use    Disposition: Sherry Baker appears stable. She continues radiation and Xeloda. She will return for a follow-up visit in 2 weeks. She will contact the office in the interim with any problems.     Ned Card ANP/GNP-BC   02/27/2017  12:06 PM

## 2017-03-02 ENCOUNTER — Ambulatory Visit
Admission: RE | Admit: 2017-03-02 | Discharge: 2017-03-02 | Disposition: A | Discharge status: Home / Self Care | Payer: Medicaid Other | Source: Ambulatory Visit | Attending: Radiation Oncology | Admitting: Radiation Oncology

## 2017-03-02 DIAGNOSIS — Z510 Encounter for antineoplastic radiation therapy: Secondary | ICD-10-CM | POA: Diagnosis not present

## 2017-03-03 ENCOUNTER — Ambulatory Visit
Admission: RE | Admit: 2017-03-03 | Discharge: 2017-03-03 | Disposition: A | Discharge status: Home / Self Care | Payer: Medicaid Other | Source: Ambulatory Visit | Attending: Radiation Oncology | Admitting: Radiation Oncology

## 2017-03-03 DIAGNOSIS — Z510 Encounter for antineoplastic radiation therapy: Secondary | ICD-10-CM | POA: Diagnosis not present

## 2017-03-04 ENCOUNTER — Ambulatory Visit: Payer: Medicaid Other

## 2017-03-05 ENCOUNTER — Ambulatory Visit: Payer: Medicaid Other

## 2017-03-06 ENCOUNTER — Ambulatory Visit: Payer: Medicaid Other

## 2017-03-09 ENCOUNTER — Ambulatory Visit: Payer: Medicaid Other

## 2017-03-10 ENCOUNTER — Ambulatory Visit: Payer: Medicaid Other

## 2017-03-11 ENCOUNTER — Ambulatory Visit: Payer: Medicaid Other

## 2017-03-11 ENCOUNTER — Telehealth: Payer: Self-pay | Admitting: *Deleted

## 2017-03-11 NOTE — Telephone Encounter (Signed)
Called patient missed again today for radiation treatment, she stated"I can't come in today ,my rectum and vagina is burning", asked if she could come in to see MD today since she was in a lot of pain, "No, I'll come in tomorrow I promise", called [facility],spoke with Ciara,RT Therapist and will inbasket  Sherry Baker 11:13 AM

## 2017-03-11 NOTE — Telephone Encounter (Signed)
Can you let her know I can see her tomorrow if she likes?

## 2017-03-12 ENCOUNTER — Ambulatory Visit
Admission: RE | Admit: 2017-03-12 | Discharge: 2017-03-12 | Disposition: A | Discharge status: Home / Self Care | Payer: Medicaid Other | Source: Ambulatory Visit | Attending: Radiation Oncology | Admitting: Radiation Oncology

## 2017-03-12 DIAGNOSIS — Z510 Encounter for antineoplastic radiation therapy: Secondary | ICD-10-CM | POA: Diagnosis not present

## 2017-03-13 ENCOUNTER — Ambulatory Visit
Admission: RE | Admit: 2017-03-13 | Discharge: 2017-03-13 | Disposition: A | Discharge status: Home / Self Care | Payer: Medicaid Other | Source: Ambulatory Visit | Attending: Radiation Oncology | Admitting: Radiation Oncology

## 2017-03-13 ENCOUNTER — Other Ambulatory Visit: Payer: Self-pay | Admitting: Radiation Oncology

## 2017-03-13 ENCOUNTER — Telehealth: Payer: Self-pay | Admitting: Oncology

## 2017-03-13 ENCOUNTER — Other Ambulatory Visit (HOSPITAL_BASED_OUTPATIENT_CLINIC_OR_DEPARTMENT_OTHER): Payer: Self-pay

## 2017-03-13 ENCOUNTER — Ambulatory Visit (HOSPITAL_BASED_OUTPATIENT_CLINIC_OR_DEPARTMENT_OTHER): Payer: Self-pay | Admitting: Oncology

## 2017-03-13 VITALS — BP 153/91 | HR 70 | Temp 97.9°F | Resp 18 | Ht 62.0 in | Wt 110.3 lb

## 2017-03-13 DIAGNOSIS — D509 Iron deficiency anemia, unspecified: Secondary | ICD-10-CM

## 2017-03-13 DIAGNOSIS — Z7289 Other problems related to lifestyle: Secondary | ICD-10-CM

## 2017-03-13 DIAGNOSIS — C20 Malignant neoplasm of rectum: Secondary | ICD-10-CM

## 2017-03-13 DIAGNOSIS — Z510 Encounter for antineoplastic radiation therapy: Secondary | ICD-10-CM | POA: Diagnosis not present

## 2017-03-13 DIAGNOSIS — Z720 Tobacco use: Secondary | ICD-10-CM

## 2017-03-13 LAB — CBC WITH DIFFERENTIAL/PLATELET
BASO%: 1.2 % (ref 0.0–2.0)
Basophils Absolute: 0.1 10*3/uL (ref 0.0–0.1)
EOS ABS: 0.3 10*3/uL (ref 0.0–0.5)
EOS%: 4.7 % (ref 0.0–7.0)
HCT: 39.6 % (ref 34.8–46.6)
HGB: 13 g/dL (ref 11.6–15.9)
LYMPH%: 17.5 % (ref 14.0–49.7)
MCH: 27.9 pg (ref 25.1–34.0)
MCHC: 32.8 g/dL (ref 31.5–36.0)
MCV: 85 fL (ref 79.5–101.0)
MONO#: 0.5 10*3/uL (ref 0.1–0.9)
MONO%: 9 % (ref 0.0–14.0)
NEUT#: 4.1 10*3/uL (ref 1.5–6.5)
NEUT%: 67.6 % (ref 38.4–76.8)
PLATELETS: 146 10*3/uL (ref 145–400)
RBC: 4.66 10*6/uL (ref 3.70–5.45)
RDW: 37.2 % — ABNORMAL HIGH (ref 11.2–14.5)
WBC: 6.1 10*3/uL (ref 3.9–10.3)
lymph#: 1.1 10*3/uL (ref 0.9–3.3)

## 2017-03-13 LAB — COMPREHENSIVE METABOLIC PANEL
ALT: 8 U/L (ref 0–55)
ANION GAP: 9 meq/L (ref 3–11)
AST: 17 U/L (ref 5–34)
Albumin: 3.5 g/dL (ref 3.5–5.0)
Alkaline Phosphatase: 71 U/L (ref 40–150)
BILIRUBIN TOTAL: 0.43 mg/dL (ref 0.20–1.20)
BUN: 6.1 mg/dL — ABNORMAL LOW (ref 7.0–26.0)
CHLORIDE: 101 meq/L (ref 98–109)
CO2: 26 meq/L (ref 22–29)
Calcium: 9.4 mg/dL (ref 8.4–10.4)
Creatinine: 0.6 mg/dL (ref 0.6–1.1)
Glucose: 91 mg/dl (ref 70–140)
POTASSIUM: 3.9 meq/L (ref 3.5–5.1)
Sodium: 135 mEq/L — ABNORMAL LOW (ref 136–145)
Total Protein: 8 g/dL (ref 6.4–8.3)

## 2017-03-13 MED ORDER — OXYCODONE-ACETAMINOPHEN 5-325 MG PO TABS: 1.0000 | ORAL_TABLET | Freq: Four times a day (QID) | ORAL | 0 refills | Status: DC | PRN

## 2017-03-13 MED ORDER — CAPECITABINE 500 MG PO TABS: ORAL_TABLET | ORAL | Status: DC

## 2017-03-13 NOTE — Progress Notes (Signed)
  [facility] OFFICE PROGRESS NOTE   Diagnosis: Rectal cancer  INTERVAL HISTORY:   She returns as scheduled. She missed radiation from 03/04/2017-03/11/2017. She reports malaise. The rectal bleeding has improved. She reports bilateral "hip "pain. She relates this to hip arthritis. No mouth sores, diarrhea, or hand/foot pain.  Objective:  Vital signs in last 24 hours:  Blood pressure (!) 153/91, pulse 70, temperature 97.9 F (36.6 C), temperature source Oral, resp. rate 18, height 5\' 2"  (1.575 m), weight 110 lb 4.8 oz (50 kg), SpO2 100 %.    HEENT: No thrush or ulcers Resp: Lungs clear bilaterally Cardio: Regular rate and rhythm GI: No hepatosplenomegaly, nontender Vascular: No leg edema  Skin: Palms and soles without erythema or skin breakdown   Lab Results:  Lab Results  Component Value Date   WBC 6.1 03/13/2017   HGB 13.0 03/13/2017   HCT 39.6 03/13/2017   MCV 85.0 03/13/2017   PLT 146 03/13/2017   NEUTROABS 4.1 03/13/2017    CMP     Component Value Date/Time   NA 135 (L) 03/13/2017 0936   K 3.9 03/13/2017 0936   CL 107 01/23/2017 0432   CO2 26 03/13/2017 0936   GLUCOSE 91 03/13/2017 0936   BUN 6.1 (L) 03/13/2017 0936   CREATININE 0.6 03/13/2017 0936   CALCIUM 9.4 03/13/2017 0936   PROT 8.0 03/13/2017 0936   ALBUMIN 3.5 03/13/2017 0936   AST 17 03/13/2017 0936   ALT 8 03/13/2017 0936   ALKPHOS 71 03/13/2017 0936   BILITOT 0.43 03/13/2017 0936   GFRNONAA >60 01/23/2017 0432   GFRAA >60 01/23/2017 0432     Medications: I have reviewed the patient's current medications.  Assessment/Plan: 1. Rectal cancer-distal rectal mass noted on colonoscopy 01/21/2017 with a biopsy confirming adenocarcinoma  ? CT abdomen/pelvis 01/17/2017 and CT chest 01/19/2017 with no evidence of distant metastatic disease ? Radiation/Xeloda initiated 02/17/2017  2. Iron deficiency anemia secondary to #1-Improved  3. Alcohol and tobacco  use    Disposition:  She appears to be tolerating the Xeloda well. She will continue Xeloda and radiation. Ms. Gasparyan will return for an office and lab visit in 2 weeks.  15 minutes were spent with the patient today. The majority of the time was used for counseling and coordination of care.  Betsy Coder, MD  03/13/2017  10:51 AM

## 2017-03-13 NOTE — Telephone Encounter (Signed)
Gave patient AVS and calender per 5/11 los.

## 2017-03-16 ENCOUNTER — Ambulatory Visit: Payer: Medicaid Other

## 2017-03-16 MED FILL — OXYCODONE-ACETAMINOPHEN 5-3: 5-325 | 5 days supply | Qty: 40 | Fill #0

## 2017-03-17 ENCOUNTER — Telehealth: Payer: Self-pay | Admitting: *Deleted

## 2017-03-17 ENCOUNTER — Ambulatory Visit: Payer: Medicaid Other

## 2017-03-17 NOTE — Telephone Encounter (Signed)
Called patient home, spoke with patients cousin, "no she's not here,she took off with someone and don't know if she will be back soomn or not if so I'll have her call you 12:16 PM

## 2017-03-18 ENCOUNTER — Ambulatory Visit: Payer: Medicaid Other

## 2017-03-19 ENCOUNTER — Other Ambulatory Visit: Payer: Self-pay | Admitting: Oncology

## 2017-03-19 ENCOUNTER — Ambulatory Visit: Payer: Medicaid Other

## 2017-03-19 DIAGNOSIS — C20 Malignant neoplasm of rectum: Secondary | ICD-10-CM

## 2017-03-20 ENCOUNTER — Ambulatory Visit: Payer: Self-pay | Admitting: Family Medicine

## 2017-03-20 ENCOUNTER — Ambulatory Visit: Payer: Medicaid Other

## 2017-03-20 ENCOUNTER — Ambulatory Visit
Admission: RE | Admit: 2017-03-20 | Discharge: 2017-03-20 | Disposition: A | Discharge status: Home / Self Care | Payer: Medicaid Other | Source: Ambulatory Visit | Attending: Radiation Oncology | Admitting: Radiation Oncology

## 2017-03-20 NOTE — Telephone Encounter (Signed)
Left message for pt to call office: Does she have enough Xeloda tablets to complete radiation?

## 2017-03-23 ENCOUNTER — Ambulatory Visit: Payer: Medicaid Other

## 2017-03-23 ENCOUNTER — Telehealth: Payer: Self-pay | Admitting: *Deleted

## 2017-03-23 NOTE — Telephone Encounter (Signed)
Left vm for patient to call us ,2nd day in a row  Of not coming for his radiation treatment.now 12:09 PM  .

## 2017-03-24 ENCOUNTER — Ambulatory Visit: Payer: Medicaid Other

## 2017-03-24 ENCOUNTER — Telehealth: Payer: Self-pay | Admitting: Radiation Oncology

## 2017-03-24 ENCOUNTER — Telehealth: Payer: Self-pay | Admitting: Genetics

## 2017-03-24 ENCOUNTER — Ambulatory Visit: Payer: Self-pay | Admitting: Genetics

## 2017-03-24 ENCOUNTER — Encounter: Payer: Self-pay | Admitting: Genetics

## 2017-03-24 DIAGNOSIS — Z1379 Encounter for other screening for genetic and chromosomal anomalies: Secondary | ICD-10-CM

## 2017-03-24 HISTORY — DX: Encounter for other screening for genetic and chromosomal anomalies: Z13.79

## 2017-03-24 NOTE — Telephone Encounter (Signed)
Reviewed that germline genetic testing revealed no pathogenic mutations. This is considered to be a negative result. Testing was performed through [facility]'s 46-gene Common Hereditary Cancers Panel. [facility]'s Common Hereditary Cancers Panel includes analysis of the following 46 genes: APC, ATM, AXIN2, BARD1, BMPR1A, BRCA1, BRCA2, BRIP1, CDH1, CDKN2A, CHEK2, CTNNA1, DICER1, EPCAM, GREM1, HOXB13, KIT, MEN1, MLH1, MSH2, MSH3, MSH6, MUTYH, NBN, NF1, NTHL1, PALB2, PDGFRA, PMS2, POLD1, POLE, PTEN, RAD50, RAD51C, RAD51D, SDHA, SDHB, SDHC, SDHD, SMAD4, SMARCA4, STK11, TP53, TSC1, TSC2, and VHL.  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 Discussed that this VUS should not change clinical management.  For more detailed discussion, please see genetic counseling documentation from 03/24/2017. Result report dated 03/18/2017.  During the results disclosure, Sherry Baker reported that she was experiencing a lot of pain in her legs and asked if she should go to the hospital. She also mentioned Dr. Benay Spice and Dr. Lisbeth Renshaw, though I could not completely understand what she was saying. I told her that if she feels she needs to go to the hospital that she should do so. She said she would call 911 for an ambulance shortly before our phone connection was cut-off. I attempted to reach Sherry Baker again, but my call was sent directly to her voicemail. I left a voicemail with Dr. Gearldine Shown desk nurse explaining my phone conversation with [name]. I also talked with the radiation nurses and left a message with the same information.

## 2017-03-24 NOTE — Progress Notes (Signed)
HPI: Ms. Temkin was previously seen in the [facility] clinic on 02/17/2017 due to a personal and family history of cancer and concerns regarding a hereditary predisposition to cancer. Please refer to our prior cancer genetics clinic note for more information regarding Ms. Kulkarni's medical, social and family histories, and our assessment and recommendations, at the time. Ms. Dredge recent genetic test results were disclosed to her, as were recommendations warranted by these results. These results and recommendations are discussed in more detail below.  CANCER HISTORY:    Rectal cancer ([place])   01/17/2017 Imaging    CT abdomen and pelvis with contrast IMPRESSION: 1. Diffusely thickened and irregular rectum with what appears to be a lobular enhancing soft tissue mass extending into the perirectal fat at least 1 small (11 mm) low-attenuation region in the perirectal fat may represent a small abscess and thus evidence of micro perforation. Findings are most consistent with locally advanced rectal cancer. Severe proctitis with developing adjacent inflammatory phlegmon is also possible but less likely. 2. No definite metastatic adenopathy or distant metastatic disease. 3. Aortic Atherosclerosis       01/19/2017 Imaging    CT chest without contrast showed no acute abnormality      01/21/2017 Initial Diagnosis    Rectal cancer ([place])      01/21/2017 Procedure    Colonoscopy by Dr. Alessandra Bevels showed a 5 cm infiltrative ulcerated partially circumferential mass in the rectum, non-obstructing, diverticulosis in the sigmoid colon, one 7 mm polyps in the sigmoid colon removed.      01/21/2017 Initial Biopsy    Rectal mass biopsy showed adenocarcinoma        FAMILY HISTORY:  We obtained a detailed, 4-generation family history.  Significant diagnoses are listed below: Family History  Problem Relation Age of Onset  . Cancer Mother 65       Originating in the abdomen, otherwise  unknwon  . Cancer - Other Maternal Aunt        Throat   Ms. Woolford has two sons, ages 108 and 64. However, information regarding their health is very limited. She reports that her sons were removed from her custody when her oldest was 38 and the youngest was a newborn and she has no further information about them. Ms. Dorrance is the only child between her parents. She has a maternal half-sister who died of AIDS in her 47s. She also has a paternal half-brother who was shot at age 60. Neither had cancers. Her brother did not have children. Her sister had one son who is currently in his early-20s.  Ms. Neth mother died at age 47 and had a history of "stomach" cancer. Her mother had 7 brothers and one sister. Her sister had throat cancer and died in her 58s. There are no known cancers in her brothers.  Ms. Labuda father died at 76 without known cancers. He had 4 brothers and 5 sisters. Information is limited regarding these family members, but none are known to have cancers.  There are no known cancers in Ms. Hester's grandparents.  Ms. Lerman is unaware of previous family history of genetic testing for hereditary cancer risks. Patient's maternal and paternal ancestors are of Caucasian descent. There is no reported Ashkenazi Jewish ancestry. There is no known consanguinity.  GENETIC TEST RESULTS: Genetic testing performed through [facility] Common Hereditary Cancers panel reported out on 03/18/2017 showed no pathogenic mutations. [facility] Common Hereditary Cancers Panel includes analysis of the following 46 genes: APC, ATM, AXIN2, BARD1,  BMPR1A, BRCA1, BRCA2, BRIP1, CDH1, CDKN2A, CHEK2, CTNNA1, DICER1, EPCAM, GREM1, HOXB13, KIT, MEN1, MLH1, MSH2, MSH3, MSH6, MUTYH, NBN, NF1, NTHL1, PALB2, PDGFRA, PMS2, POLD1, POLE, PTEN, RAD50, RAD51C, RAD51D, SDHA, SDHB, SDHC, SDHD, SMAD4, SMARCA4, STK11, TP53, TSC1, TSC2, and VHL.  A variant of uncertain significance (VUS) called RAD50 c.1759A>C (p.Ile587Leu) was  also noted. At this time, it is unknown if this variant is associated with increased cancer risk or if this is a normal finding, but most variants such as this get reclassified to being inconsequential. It should not be used to make medical management decisions. With time, we suspect the lab will determine the significance of this variant, if any. If we do learn more about it, we will try to contact Ms. Blash to discuss it further. However, it is important to stay in touch with [place] periodically and keep the address and phone number up to date.  The test report will be scanned into EPIC and will be located under the Molecular Pathology section of the Results Review tab.A portion of the result report is included below for reference.     We discussed with Ms. Shimada that since the current genetic testing is not perfect, it is possible there may be a gene mutation in one of these genes that current testing cannot detect, but that chance is small. We also discussed, that it is possible that another gene that has not yet been discovered, or that we have not yet tested, is responsible for the cancer diagnoses in the family. Therefore, important to remain in touch with cancer genetics in the future so that we can continue to offer Ms. Markman the most up to date genetic testing.   CANCER SCREENING RECOMMENDATIONS: Given Ms. Kwasnik's personal and family histories, we must interpret these negative results with some caution.  Families with features suggestive of hereditary risk for cancer tend to have multiple family members with cancer, diagnoses in multiple generations and diagnoses before the age of 1. Ms. Devine family exhibits some of these features. Thus this result may simply reflect our current inability to detect all mutations within these genes or there may be a different gene that has not yet been discovered or tested. However, since no causative or actionable mutations were identified, Ms. Saini's  cancer treatments and screenings should be based on other aspects of her diagnosis rather than her genetic testing results. If she has questions regarding her cancer treatments and screenings, she should discuss further with her referring provider and oncology team.   RECOMMENDATIONS FOR FAMILY MEMBERS: Because no actionable mutations were identified, there is no genetic testing to recommend for Ms. Sayavong's family members at this time. Though she is not in contact with her biological sons, if she makes contact with them in the future, NCCN guidelines state that they should begin colonoscopies at age 74 and repeat at least every 5 years. Ms. Spiers second-degree relatives (aunts, uncles) should report their family history of cancer to their physicians to establish a personalized cancer screening program. Per NCCN guidelines, Ms. Elrod's second-degree relatives are recommended to begin colonoscopies at 80 and repeat every 5-10 years.  FOLLOW-UP: Lastly, cancer genetics is a rapidly advancing field and it is possible that new genetic tests will be appropriate for Ms. Vanzile and/or her family members in the future. She is encouraged to remain in contact with cancer genetics on an annual basis so we can update her personal and family histories and let her know of advances in cancer genetics  that may benefit this family.   Our contact number was provided. Ms. Kist questions were answered to her satisfaction, and she knows she is welcome to call us at anytime with additional questions or concerns.   Mal Misty, MS, [facility] Certified Naval [email or web address]

## 2017-03-24 NOTE — Telephone Encounter (Signed)
Received word from Vicente Males, Dietitian, that while she spoke with the patient over the phone the patient expressed she was in severe pain. Vicente Males reports she encouraged the patient to present to the ER for evaluation. Vicente Males reports the patient told her she had considered calling 911. Vicente Males explained she encouraged her to call but, the call got disconnected. Vicente Males reports attempts to reach the patient since have been unsuccessful. This RN attempted to reach patient with no luck thus, I left a message requesting a return call.

## 2017-03-25 ENCOUNTER — Ambulatory Visit: Payer: Medicaid Other

## 2017-03-25 ENCOUNTER — Telehealth: Payer: Self-pay | Admitting: *Deleted

## 2017-03-25 NOTE — Telephone Encounter (Signed)
Called patient home, left vm, missed her rad tx ahgain, checked,patient isn't in the hospital here ,asked that she call us back, and if still in a lot of pain to go to the Ed 11:26 AM'

## 2017-03-26 ENCOUNTER — Ambulatory Visit: Payer: Medicaid Other

## 2017-03-26 ENCOUNTER — Encounter: Payer: Self-pay | Admitting: Radiation Oncology

## 2017-03-26 ENCOUNTER — Telehealth: Payer: Self-pay | Admitting: *Deleted

## 2017-03-26 NOTE — Telephone Encounter (Signed)
Called the patient again, went straight to voice message, left vm to call us and let us know if she is okay, if she is coming anymore for radiation treatments 9:08 AM

## 2017-03-27 ENCOUNTER — Telehealth: Payer: Self-pay | Admitting: *Deleted

## 2017-03-27 ENCOUNTER — Ambulatory Visit: Payer: Self-pay | Admitting: Nurse Practitioner

## 2017-03-27 ENCOUNTER — Ambulatory Visit: Payer: Medicaid Other

## 2017-03-27 ENCOUNTER — Other Ambulatory Visit: Payer: Self-pay

## 2017-03-27 NOTE — Telephone Encounter (Signed)
Telephone call to patient- lm for a return call.  Telephone call to emergency contact- Elige Radon. Writer expressed concern that patient has been missing appointments and treatment. Pt Aunt states that Patient has decided to stop treatment. "She has made her mind up and there is no changing it". She will relay the message to the patient that we need to have her call the office. Advised Aunt if she is having trouble with side effects of treatment we need to know so we can help her manage those. Aunt states she will try to get her to call this office.

## 2017-03-30 ENCOUNTER — Ambulatory Visit: Payer: Medicaid Other

## 2017-03-31 ENCOUNTER — Ambulatory Visit: Payer: Medicaid Other

## 2017-03-31 ENCOUNTER — Encounter: Payer: Self-pay | Admitting: *Deleted

## 2017-03-31 ENCOUNTER — Other Ambulatory Visit: Payer: Self-pay | Admitting: Radiation Oncology

## 2017-03-31 ENCOUNTER — Encounter: Payer: Self-pay | Admitting: Radiation Oncology

## 2017-03-31 DIAGNOSIS — F1910 Other psychoactive substance abuse, uncomplicated: Secondary | ICD-10-CM

## 2017-03-31 DIAGNOSIS — C20 Malignant neoplasm of rectum: Secondary | ICD-10-CM

## 2017-03-31 NOTE — Progress Notes (Signed)
The patient presented today to resume her radiation treatment for rectal cancer. She has noticed over 10 treatments and only completed 11th date. She has been counseled on the importance of complete treatment, however has been significantly limited by lack of funds for her cell phone to be patent 4. She states that she has not received any infarcts or written notification regarding her status. Dr. Lisbeth Renshaw I have discussed the importance of moving forward with treatment however impressed upon her the importance of continuation. We will have social work meet with the patient to determine options  for resuming her treatment. I will also refer her to [facility] management for help with obtaining a PCP for her other medical concerns which appear to have limited her ability to attend her oncology treatments. She will plan to re-simulated on Friday at 10 am, and move forward with treatment following this.      Carola Rhine, PAC

## 2017-03-31 NOTE — Progress Notes (Signed)
  Radiation Oncology         [phone] ________________________________  Name: Sherry Baker MRN: [id number]  Date: 03/31/2017  DOB: [birth year]  End of Treatment Note  Diagnosis:  Unstaged Adenocarcinoma of the rectum    Indication for treatment:  Curative       Radiation treatment dates: 02/17/17-03/13/17  Site/dose:   1) Rectum/ delivered 19.8 Gy in 11 fractions of a planned 45 Gy in 25 fractions   2) Rectum boost/ delivered 0 Gy of a planned 5.4 Gy in 3 fractions   Beams/energy:  1) 3D/ 15X, 6X  Narrative: The patient tolerated radiation treatment relatively well. During treatment the patient noted weight loss, and leg pain. She was encouraged to drink Ensure to increase calorie intake. Patient missed several treatments and reported to nursing over the phone on 03/11/17 she was experiencing pain to the vagina and rectum. Patient spoke with genetic counseling over the phone on 03/24/17 and expressed she was in severe pain, she was encouraged to go to the ER. Patient did not check into the hospital and several voice messages were left for the patient. The current treatment plan has been cancelled and the patient will need to return for re-evaluation if she would like to continue with radiation therapy.  Plan: We have reached out to the patient to try and get her to contact us for possible further management. Marland Kitchen  ------------------------------------------------  Jodelle Gross, MD, PhD  This document serves as a record of services personally performed by Kyung Rudd, MD. It was created on his behalf by Bethann Humble, a trained medical scribe. The creation of this record is based on the scribe's personal observations and the provider's statements to them. This document has been checked and approved by the attending provider.

## 2017-03-31 NOTE — Progress Notes (Signed)
[place] [facility]  Clinical Social Work received referral from [place] for assistance with identifying a [place] manager.  CSW met with patient in [place] office at [facility].  Patient stated she recently received a letter stating her Medicaid was pending.  Patient stated she would bring letter to her next appointment for CSW to review.  If patient is approved for Medicaid she will be given a list of Medicaid approved PCP's.  CSW also provided patient with information on [facility] and encouraged her to call to see if she would qualify for the [employer].  Patient is also familiar with the [facility].  [facility] may be able to connect patient to additional resources.  Patient plans to visit [facility] and contact [place] this week.  CSW encouraged patient to call with additional questions or concerns.    Johnnye Lana, MSW, LCSW, OSW-C Clinical Social Worker [facility] [phone]

## 2017-04-01 ENCOUNTER — Ambulatory Visit: Payer: Medicaid Other

## 2017-04-02 ENCOUNTER — Ambulatory Visit: Payer: Medicaid Other

## 2017-04-03 ENCOUNTER — Ambulatory Visit: Payer: Medicaid Other

## 2017-04-03 ENCOUNTER — Telehealth: Payer: Self-pay | Admitting: *Deleted

## 2017-04-03 ENCOUNTER — Ambulatory Visit
Admission: RE | Admit: 2017-04-03 | Discharge: 2017-04-03 | Disposition: A | Discharge status: Home / Self Care | Payer: Medicaid Other | Source: Ambulatory Visit | Attending: Radiation Oncology | Admitting: Radiation Oncology

## 2017-04-03 DIAGNOSIS — C20 Malignant neoplasm of rectum: Secondary | ICD-10-CM

## 2017-04-03 DIAGNOSIS — Z510 Encounter for antineoplastic radiation therapy: Secondary | ICD-10-CM | POA: Diagnosis not present

## 2017-04-03 NOTE — Telephone Encounter (Signed)
PATIENT TO HAVE THIS APPT. ON 07-17-17 - ARRIVAL TIME - 2:15 PM WITH DR. DEAN MITCHELL, PT. TO PAY $100.00 PRIOR TO VISIT, PT. GIVEN APPT. CARD

## 2017-04-04 ENCOUNTER — Ambulatory Visit: Payer: Medicaid Other

## 2017-04-06 ENCOUNTER — Ambulatory Visit: Payer: Medicaid Other

## 2017-04-06 DIAGNOSIS — Z510 Encounter for antineoplastic radiation therapy: Secondary | ICD-10-CM | POA: Diagnosis not present

## 2017-04-07 ENCOUNTER — Ambulatory Visit: Payer: Medicaid Other

## 2017-04-08 ENCOUNTER — Ambulatory Visit: Payer: Medicaid Other

## 2017-04-09 ENCOUNTER — Ambulatory Visit: Payer: Medicaid Other

## 2017-04-09 ENCOUNTER — Ambulatory Visit
Admission: RE | Admit: 2017-04-09 | Discharge: 2017-04-09 | Disposition: A | Discharge status: Home / Self Care | Payer: Medicaid Other | Source: Ambulatory Visit | Attending: Radiation Oncology | Admitting: Radiation Oncology

## 2017-04-09 DIAGNOSIS — Z510 Encounter for antineoplastic radiation therapy: Secondary | ICD-10-CM | POA: Diagnosis not present

## 2017-04-10 ENCOUNTER — Ambulatory Visit: Payer: Medicaid Other

## 2017-04-10 ENCOUNTER — Ambulatory Visit
Admission: RE | Admit: 2017-04-10 | Discharge: 2017-04-10 | Disposition: A | Discharge status: Home / Self Care | Payer: Medicaid Other | Source: Ambulatory Visit | Attending: Radiation Oncology | Admitting: Radiation Oncology

## 2017-04-10 DIAGNOSIS — Z510 Encounter for antineoplastic radiation therapy: Secondary | ICD-10-CM | POA: Diagnosis not present

## 2017-04-10 NOTE — Addendum Note (Signed)
Addendum  created 04/10/17 1117 by Lyn Hollingshead, MD   Sign clinical note

## 2017-04-13 ENCOUNTER — Ambulatory Visit: Payer: Medicaid Other

## 2017-04-13 ENCOUNTER — Ambulatory Visit
Admission: RE | Admit: 2017-04-13 | Discharge: 2017-04-13 | Disposition: A | Discharge status: Home / Self Care | Payer: Medicaid Other | Source: Ambulatory Visit | Attending: Radiation Oncology | Admitting: Radiation Oncology

## 2017-04-13 ENCOUNTER — Other Ambulatory Visit: Payer: Self-pay | Admitting: Oncology

## 2017-04-13 DIAGNOSIS — C20 Malignant neoplasm of rectum: Secondary | ICD-10-CM

## 2017-04-13 DIAGNOSIS — Z510 Encounter for antineoplastic radiation therapy: Secondary | ICD-10-CM | POA: Diagnosis not present

## 2017-04-14 ENCOUNTER — Ambulatory Visit: Payer: Medicaid Other

## 2017-04-14 ENCOUNTER — Ambulatory Visit
Admission: RE | Admit: 2017-04-14 | Discharge: 2017-04-14 | Disposition: A | Discharge status: Home / Self Care | Payer: Medicaid Other | Source: Ambulatory Visit | Attending: Radiation Oncology | Admitting: Radiation Oncology

## 2017-04-14 DIAGNOSIS — Z510 Encounter for antineoplastic radiation therapy: Secondary | ICD-10-CM | POA: Diagnosis not present

## 2017-04-15 ENCOUNTER — Ambulatory Visit: Payer: Medicaid Other

## 2017-04-15 ENCOUNTER — Ambulatory Visit
Admission: RE | Admit: 2017-04-15 | Discharge: 2017-04-15 | Disposition: A | Discharge status: Home / Self Care | Payer: Medicaid Other | Source: Ambulatory Visit | Attending: Radiation Oncology | Admitting: Radiation Oncology

## 2017-04-15 DIAGNOSIS — Z510 Encounter for antineoplastic radiation therapy: Secondary | ICD-10-CM | POA: Diagnosis not present

## 2017-04-16 ENCOUNTER — Ambulatory Visit: Payer: Medicaid Other

## 2017-04-16 ENCOUNTER — Ambulatory Visit
Admission: RE | Admit: 2017-04-16 | Discharge: 2017-04-16 | Disposition: A | Discharge status: Home / Self Care | Payer: Medicaid Other | Source: Ambulatory Visit | Attending: Radiation Oncology | Admitting: Radiation Oncology

## 2017-04-16 DIAGNOSIS — Z510 Encounter for antineoplastic radiation therapy: Secondary | ICD-10-CM | POA: Diagnosis not present

## 2017-04-17 ENCOUNTER — Other Ambulatory Visit: Payer: Self-pay | Admitting: Radiation Oncology

## 2017-04-17 ENCOUNTER — Ambulatory Visit: Payer: Medicaid Other

## 2017-04-17 ENCOUNTER — Ambulatory Visit
Admission: RE | Admit: 2017-04-17 | Discharge: 2017-04-17 | Disposition: A | Discharge status: Home / Self Care | Payer: Medicaid Other | Source: Ambulatory Visit | Attending: Radiation Oncology | Admitting: Radiation Oncology

## 2017-04-17 DIAGNOSIS — Z510 Encounter for antineoplastic radiation therapy: Secondary | ICD-10-CM | POA: Diagnosis not present

## 2017-04-17 MED ORDER — OXYCODONE-ACETAMINOPHEN 5-325 MG PO TABS: 1.0000 | ORAL_TABLET | Freq: Four times a day (QID) | ORAL | 0 refills | Status: DC | PRN

## 2017-04-17 MED FILL — OXYCODONE-ACETAMINOPHEN 5-3: 5-325 | 5 days supply | Qty: 40 | Fill #0

## 2017-04-20 ENCOUNTER — Ambulatory Visit
Admission: RE | Admit: 2017-04-20 | Discharge: 2017-04-20 | Disposition: A | Discharge status: Home / Self Care | Payer: Medicaid Other | Source: Ambulatory Visit | Attending: Radiation Oncology | Admitting: Radiation Oncology

## 2017-04-20 ENCOUNTER — Ambulatory Visit: Payer: Medicaid Other

## 2017-04-20 DIAGNOSIS — Z510 Encounter for antineoplastic radiation therapy: Secondary | ICD-10-CM | POA: Diagnosis not present

## 2017-04-21 ENCOUNTER — Ambulatory Visit
Admission: RE | Admit: 2017-04-21 | Discharge: 2017-04-21 | Disposition: A | Discharge status: Home / Self Care | Payer: Medicaid Other | Source: Ambulatory Visit | Attending: Radiation Oncology | Admitting: Radiation Oncology

## 2017-04-21 ENCOUNTER — Ambulatory Visit (HOSPITAL_BASED_OUTPATIENT_CLINIC_OR_DEPARTMENT_OTHER): Payer: Self-pay | Admitting: Nurse Practitioner

## 2017-04-21 ENCOUNTER — Telehealth: Payer: Self-pay | Admitting: Nurse Practitioner

## 2017-04-21 VITALS — BP 106/69 | HR 86 | Temp 98.1°F | Resp 18 | Ht 62.0 in | Wt 113.6 lb

## 2017-04-21 DIAGNOSIS — K140 Glossitis: Secondary | ICD-10-CM

## 2017-04-21 DIAGNOSIS — Z510 Encounter for antineoplastic radiation therapy: Secondary | ICD-10-CM | POA: Diagnosis not present

## 2017-04-21 DIAGNOSIS — D509 Iron deficiency anemia, unspecified: Secondary | ICD-10-CM

## 2017-04-21 DIAGNOSIS — C20 Malignant neoplasm of rectum: Secondary | ICD-10-CM

## 2017-04-21 DIAGNOSIS — Z720 Tobacco use: Secondary | ICD-10-CM

## 2017-04-21 DIAGNOSIS — F1010 Alcohol abuse, uncomplicated: Secondary | ICD-10-CM

## 2017-04-21 NOTE — Telephone Encounter (Signed)
Scheduled appt per 6/19 los - Gave patient AVS and calender per LOS - faxed info over to [facility] for Dr. Leighton Ruff.

## 2017-04-21 NOTE — Progress Notes (Addendum)
  [facility] OFFICE PROGRESS NOTE   Diagnosis:  Rectal cancer  INTERVAL HISTORY:   Sherry Baker returns after missing several follow-up visits. She began radiation/Xeloda on 02/17/2017. She was last seen in our office on 03/13/2017. At that time she had missed radiation from 03/04/2017 to 03/11/2017. She subsequently discontinued radiation. It appears she resumed radiation 04/03/2017.   She reports she has continued taking Xeloda as prescribed Monday through Friday. She is no longer having nausea. She has a single tongue ulcer. Bowels moving. No diarrhea. Rectal bleeding is better.   Objective:  Vital signs in last 24 hours:  Blood pressure 106/69, pulse 86, temperature 98.1 F (36.7 C), temperature source Oral, resp. rate 18, height 5\' 2"  (1.575 m), weight 113 lb 9.6 oz (51.5 kg), SpO2 95 %.    HEENT: ulceration right lateral tongue. Resp: lungs clear bilaterally. Cardio: regular rate and rhythm. GI: no hepatomegaly. Vascular: no leg edema. Skin: palms without erythema.     Lab Results:  Lab Results  Component Value Date   WBC 6.1 03/13/2017   HGB 13.0 03/13/2017   HCT 39.6 03/13/2017   MCV 85.0 03/13/2017   PLT 146 03/13/2017   NEUTROABS 4.1 03/13/2017    Imaging:  No results found.  Medications: I have reviewed the patient's current medications.  Assessment/Plan: 1. Rectal cancer-distal rectal mass noted on colonoscopy 01/21/2017 with a biopsy confirming adenocarcinoma  ? CT abdomen/pelvis 01/17/2017 and CT chest 01/19/2017 with no evidence of distant metastatic disease ? Radiation/Xeloda initiated 02/17/2017; She did not receive radiation 03/04/2017 through 03/11/2017; received radiation 03/12/2017 and 03/13/2017; radiation then resumed 04/03/2017; she continued Xeloda  2. Iron deficiency anemia secondary to #1  3. Alcohol and tobacco use  4.   Tongue ulcer 04/21/2017-question mucositis related to Xeloda.   Disposition: Sherry Baker  appears stable. She has resumed radiation. She will continue Xeloda on days of radiation. She is scheduled to complete the course of radiation on 05/01/2017.   She has an ulcer on the right lateral tongue. This may be related to Xeloda. She will try oragel. She will contact the office if she develops additional ulcers.   She will return to the lab today or tomorrow for a CBC and chemistry panel.  We made a referral to Dr. Marcello Moores for surgery planning pending completion of XRT/Xeloda.   She will return for a follow-up visit approximately 1 month after completing the course of neoadjuvant therapy.   Patient seen with Dr. Benay Spice.     Ned Card ANP/GNP-BC   04/21/2017  12:46 PM  This was a shared visit with Ned Card. Sherry Baker was interviewed and examined. She missed several weeks of radiation. She will continue Xeloda with the remaining radiation treatment.  We will refer her to Dr. Marcello Moores for surgical planning.  Julieanne Manson, M.D.

## 2017-04-22 ENCOUNTER — Other Ambulatory Visit (HOSPITAL_BASED_OUTPATIENT_CLINIC_OR_DEPARTMENT_OTHER): Payer: Self-pay

## 2017-04-22 ENCOUNTER — Ambulatory Visit
Admission: RE | Admit: 2017-04-22 | Discharge: 2017-04-22 | Disposition: A | Discharge status: Home / Self Care | Payer: Medicaid Other | Source: Ambulatory Visit | Attending: Radiation Oncology | Admitting: Radiation Oncology

## 2017-04-22 ENCOUNTER — Telehealth: Payer: Self-pay | Admitting: *Deleted

## 2017-04-22 ENCOUNTER — Other Ambulatory Visit: Payer: Self-pay

## 2017-04-22 DIAGNOSIS — Z510 Encounter for antineoplastic radiation therapy: Secondary | ICD-10-CM | POA: Diagnosis not present

## 2017-04-22 DIAGNOSIS — C20 Malignant neoplasm of rectum: Secondary | ICD-10-CM

## 2017-04-22 LAB — COMPREHENSIVE METABOLIC PANEL
ALK PHOS: 68 U/L (ref 40–150)
ALT: 15 U/L (ref 0–55)
AST: 26 U/L (ref 5–34)
Albumin: 3.6 g/dL (ref 3.5–5.0)
Anion Gap: 13 mEq/L — ABNORMAL HIGH (ref 3–11)
BUN: 9.5 mg/dL (ref 7.0–26.0)
CHLORIDE: 96 meq/L — AB (ref 98–109)
CO2: 24 mEq/L (ref 22–29)
Calcium: 9.4 mg/dL (ref 8.4–10.4)
Creatinine: 0.6 mg/dL (ref 0.6–1.1)
GLUCOSE: 79 mg/dL (ref 70–140)
POTASSIUM: 3.9 meq/L (ref 3.5–5.1)
SODIUM: 133 meq/L — AB (ref 136–145)
Total Bilirubin: 0.31 mg/dL (ref 0.20–1.20)
Total Protein: 7.9 g/dL (ref 6.4–8.3)

## 2017-04-22 LAB — CBC WITH DIFFERENTIAL/PLATELET
BASO%: 0.4 % (ref 0.0–2.0)
BASOS ABS: 0 10*3/uL (ref 0.0–0.1)
EOS ABS: 0.1 10*3/uL (ref 0.0–0.5)
EOS%: 2.7 % (ref 0.0–7.0)
HCT: 37.6 % (ref 34.8–46.6)
HGB: 12.6 g/dL (ref 11.6–15.9)
LYMPH%: 16.8 % (ref 14.0–49.7)
MCH: 31.9 pg (ref 25.1–34.0)
MCHC: 33.6 g/dL (ref 31.5–36.0)
MCV: 94.8 fL (ref 79.5–101.0)
MONO#: 0.6 10*3/uL (ref 0.1–0.9)
MONO%: 12.7 % (ref 0.0–14.0)
NEUT#: 3.2 10*3/uL (ref 1.5–6.5)
NEUT%: 67.4 % (ref 38.4–76.8)
Platelets: 168 10*3/uL (ref 145–400)
RBC: 3.96 10*6/uL (ref 3.70–5.45)
RDW: 23 % — ABNORMAL HIGH (ref 11.2–14.5)
WBC: 4.8 10*3/uL (ref 3.9–10.3)
lymph#: 0.8 10*3/uL — ABNORMAL LOW (ref 0.9–3.3)

## 2017-04-22 NOTE — Telephone Encounter (Signed)
"  I have an appointment today for lab and radiation.  I am on my way and will be there.  Please call me [phone]."    Per review of appointments at this time.  Appointments have been rescheduled.

## 2017-04-23 ENCOUNTER — Ambulatory Visit
Admission: RE | Admit: 2017-04-23 | Discharge: 2017-04-23 | Disposition: A | Discharge status: Home / Self Care | Payer: Medicaid Other | Source: Ambulatory Visit | Attending: Radiation Oncology | Admitting: Radiation Oncology

## 2017-04-23 DIAGNOSIS — Z510 Encounter for antineoplastic radiation therapy: Secondary | ICD-10-CM | POA: Diagnosis not present

## 2017-04-24 ENCOUNTER — Ambulatory Visit
Admission: RE | Admit: 2017-04-24 | Discharge: 2017-04-24 | Disposition: A | Discharge status: Home / Self Care | Payer: Medicaid Other | Source: Ambulatory Visit | Attending: Radiation Oncology | Admitting: Radiation Oncology

## 2017-04-24 DIAGNOSIS — Z510 Encounter for antineoplastic radiation therapy: Secondary | ICD-10-CM | POA: Diagnosis not present

## 2017-04-27 ENCOUNTER — Ambulatory Visit
Admission: RE | Admit: 2017-04-27 | Discharge: 2017-04-27 | Disposition: A | Discharge status: Home / Self Care | Payer: Medicaid Other | Source: Ambulatory Visit | Attending: Radiation Oncology | Admitting: Radiation Oncology

## 2017-04-27 DIAGNOSIS — Z510 Encounter for antineoplastic radiation therapy: Secondary | ICD-10-CM | POA: Diagnosis not present

## 2017-04-28 ENCOUNTER — Ambulatory Visit
Admission: RE | Admit: 2017-04-28 | Discharge: 2017-04-28 | Disposition: A | Discharge status: Home / Self Care | Payer: Medicaid Other | Source: Ambulatory Visit | Attending: Radiation Oncology | Admitting: Radiation Oncology

## 2017-04-28 ENCOUNTER — Telehealth: Payer: Self-pay | Admitting: *Deleted

## 2017-04-28 ENCOUNTER — Other Ambulatory Visit: Payer: Self-pay | Admitting: *Deleted

## 2017-04-28 DIAGNOSIS — Z510 Encounter for antineoplastic radiation therapy: Secondary | ICD-10-CM | POA: Diagnosis not present

## 2017-04-28 MED FILL — XELODA 500 MG TABLET: 500 | 3 days supply | Qty: 15 | Fill #0

## 2017-04-28 NOTE — Telephone Encounter (Signed)
"  Sherry Baker with [facility].  Is this patient on Xeloda?  She has missed appointments.  Requested refill on 04-22-2017 and want to make sure."  Per 04-21-2017 patient is to continue Xeloda with radiation.

## 2017-04-28 NOTE — Telephone Encounter (Signed)
Message from pt requesting refill of Xeloda. She reports she has #2 tablets remaining for tonight's dose. Will need 3 day supply. Reviewed with Dr. Benay Spice: Order received and e-scribed to [facility].

## 2017-04-29 ENCOUNTER — Ambulatory Visit
Admission: RE | Admit: 2017-04-29 | Discharge: 2017-04-29 | Disposition: A | Discharge status: Home / Self Care | Payer: Medicaid Other | Source: Ambulatory Visit | Attending: Radiation Oncology | Admitting: Radiation Oncology

## 2017-04-29 DIAGNOSIS — C20 Malignant neoplasm of rectum: Secondary | ICD-10-CM | POA: Diagnosis present

## 2017-04-29 DIAGNOSIS — Z510 Encounter for antineoplastic radiation therapy: Secondary | ICD-10-CM | POA: Insufficient documentation

## 2017-04-30 ENCOUNTER — Ambulatory Visit
Admission: RE | Admit: 2017-04-30 | Discharge: 2017-04-30 | Disposition: A | Discharge status: Home / Self Care | Payer: Medicaid Other | Source: Ambulatory Visit | Attending: Radiation Oncology | Admitting: Radiation Oncology

## 2017-04-30 DIAGNOSIS — Z510 Encounter for antineoplastic radiation therapy: Secondary | ICD-10-CM | POA: Diagnosis not present

## 2017-05-01 ENCOUNTER — Encounter: Payer: Self-pay | Admitting: Radiation Oncology

## 2017-05-01 ENCOUNTER — Ambulatory Visit
Admission: RE | Admit: 2017-05-01 | Discharge: 2017-05-01 | Disposition: A | Discharge status: Home / Self Care | Payer: Medicaid Other | Source: Ambulatory Visit | Attending: Radiation Oncology | Admitting: Radiation Oncology

## 2017-05-01 DIAGNOSIS — Z510 Encounter for antineoplastic radiation therapy: Secondary | ICD-10-CM | POA: Diagnosis not present

## 2017-05-13 ENCOUNTER — Encounter (HOSPITAL_COMMUNITY): Payer: Self-pay

## 2017-05-13 ENCOUNTER — Emergency Department (HOSPITAL_COMMUNITY)
Admission: EM | Admit: 2017-05-13 | Discharge: 2017-05-13 | Disposition: A | Discharge status: Home / Self Care | Payer: Medicaid Other | Attending: Emergency Medicine | Admitting: Emergency Medicine

## 2017-05-13 DIAGNOSIS — I10 Essential (primary) hypertension: Secondary | ICD-10-CM | POA: Insufficient documentation

## 2017-05-13 DIAGNOSIS — F17210 Nicotine dependence, cigarettes, uncomplicated: Secondary | ICD-10-CM | POA: Diagnosis not present

## 2017-05-13 DIAGNOSIS — K6289 Other specified diseases of anus and rectum: Secondary | ICD-10-CM | POA: Diagnosis present

## 2017-05-13 LAB — URINALYSIS, ROUTINE W REFLEX MICROSCOPIC
BACTERIA UA: NONE SEEN
BILIRUBIN URINE: NEGATIVE
GLUCOSE, UA: NEGATIVE mg/dL
Hgb urine dipstick: NEGATIVE
KETONES UR: NEGATIVE mg/dL
Nitrite: NEGATIVE
PH: 6 (ref 5.0–8.0)
PROTEIN: NEGATIVE mg/dL
RBC / HPF: NONE SEEN RBC/hpf (ref 0–5)
Specific Gravity, Urine: 1.002 — ABNORMAL LOW (ref 1.005–1.030)
Squamous Epithelial / LPF: NONE SEEN

## 2017-05-13 MED ORDER — OXYCODONE-ACETAMINOPHEN 5-325 MG PO TABS: 1.0000 | ORAL_TABLET | Freq: Four times a day (QID) | ORAL | 0 refills | Status: DC | PRN

## 2017-05-13 NOTE — ED Provider Notes (Signed)
[facility] DEPT Provider Note   CSN: [id number] Arrival date & time: 05/13/17  1150     History   Chief Complaint Chief Complaint  Patient presents with  . Rectal Cancer  . Rectal Pain    HPI Sakura Denis is a 51 y.o. female.  The history is provided by the patient. No language interpreter was used.    Laelyn Blumenthal is a 51 y.o. female who presents to the Emergency Department complaining of rectal and vaginal pain.  She has a history of rectal cancer and has recently completed radiation and chemotherapy. Her treatments ended about 2 weeks ago. About one week ago she noticed that she had a burning to her vaginal area and rectal area. Pain is worse with urination and bowel movements. She denies any fevers, nausea, vomiting, diarrhea. No vaginal discharge. The pain is constant and keeps her up at night. She has currently scheduled to follow up with her surgeon on July 16 and her oncologist on July 23.  Past Medical History:  Diagnosis Date  . Alcohol abuse   . Allergy    PCNS swelling  . Arthritis   . Bipolar 1 disorder ([place])   . Cancer ([place]) 01/21/2017   rectal cancer  . Cirrhosis of liver ([place])   . Depression   . Genetic testing 03/24/2017   Ms. Intrieri underwent genetic counseling and testing for hereditary cancer syndromes on 02/17/2017. Her results were negative for mutations in all 46 genes analyzed by [facility]'s 46-gene Common Hereditary Cancers Panel. Genes analyzed include: APC, ATM, AXIN2, BARD1, BMPR1A, BRCA1, BRCA2, BRIP1, CDH1, CDKN2A, CHEK2, CTNNA1, DICER1, EPCAM, GREM1, HOXB13, KIT, MEN1, MLH1, MSH2, MSH3, MSH6, MUTYH, NBN,  . Hypertension     Patient Active Problem List   Diagnosis Date Noted  . Genetic testing 03/24/2017  . Hypertension 02/18/2017  . GERD (gastroesophageal reflux disease) 02/18/2017  . Rectal cancer ([place]) 01/27/2017  . Abdominal pain, epigastric   . Rectal mass   . Fever   . Anemia 01/16/2017  . Abdominal pain   . Constipation   .  Alcohol abuse   . Tobacco abuse   . Cocaine abuse   . Bipolar affective disorder ([place])   . Protein-calorie malnutrition, severe ([place]) 02/13/2014  . Suicide ideation 02/12/2014  . Suicidal ideation 02/11/2014  . Alcohol intoxication ([place]) 02/11/2014  . Transaminitis 02/11/2014  . Substance abuse 02/11/2014  . Depression 02/11/2014  . Thrombocytopenia ([place]) 02/11/2014    Past Surgical History:  Procedure Laterality Date  . COLONOSCOPY WITH PROPOFOL Left 01/21/2017   Procedure: COLONOSCOPY WITH PROPOFOL;  Surgeon: Otis Brace, MD;  Location: [facility];  Service: Gastroenterology;  Laterality: Left;  Marland Kitchen MANDIBLE FRACTURE SURGERY      OB History    No data available       Home Medications    Prior to Admission medications   Medication Sig Start Date End Date Taking? Authorizing Provider  albuterol (PROVENTIL) (2.5 MG/3ML) 0.083% nebulizer solution Take 3 mLs (2.5 mg total) by nebulization every 6 (six) hours as needed for wheezing or shortness of breath. Patient not taking: Reported on 05/13/2017 01/26/17   Brayton Caves, PA-C  carvedilol (COREG) 6.25 MG tablet Take 1 tablet (6.25 mg total) by mouth 2 (two) times daily with a meal. Patient not taking: Reported on 05/13/2017 02/18/17   Arnoldo Morale, MD  ferrous sulfate (QC FERROUS SULFATE) 325 (65 FE) MG tablet Take 1 tablet (325 mg total) by mouth 2 (two) times daily with a meal. Patient  not taking: Reported on 05/13/2017 01/26/17   Brayton Caves, PA-C  FLUoxetine (PROZAC) 20 MG tablet Take 1 tablet (20 mg total) by mouth daily. Patient not taking: Reported on 05/13/2017 02/18/17   Arnoldo Morale, MD  oxyCODONE-acetaminophen (PERCOCET) 5-325 MG tablet Take 1 tablet by mouth every 6 (six) hours as needed for severe pain. 05/13/17   Quintella Reichert, MD  pantoprazole (PROTONIX) 40 MG tablet Take 1 tablet (40 mg total) by mouth daily. Patient not taking: Reported on 04/21/2017 02/18/17   Arnoldo Morale, MD  prochlorperazine (COMPAZINE)  10 MG tablet Take 1 tablet (10 mg total) by mouth every 6 (six) hours as needed for nausea or vomiting. Patient not taking: Reported on 05/13/2017 02/27/17   Kyung Rudd, MD  XELODA 500 MG tablet TAKE 3 TABLETS BY MOUTH IN THE MORNING AND 2 TABLETS IN THE EVENING ON DAYS OF RADIATION ONLY ([place]) Patient not taking: Reported on 05/13/2017 04/28/17   Ladell Pier, MD    Family History Family History  Problem Relation Age of Onset  . Cancer Mother 58       Originating in the abdomen, otherwise unknwon  . Cancer - Other Maternal Aunt        Throat    Social History Social History  Substance Use Topics  . Smoking status: Current Every Day Smoker    Packs/day: 0.50    Years: 41.00    Types: Cigarettes  . Smokeless tobacco: Former Systems developer     Comment: 7 cigs daily  . Alcohol use 1.2 oz/week    2 Cans of beer per week     Comment: last drink two weeks ago     Allergies   Penicillins   Review of Systems Review of Systems  All other systems reviewed and are negative.    Physical Exam Updated Vital Signs BP (!) 129/91 (BP Location: Left Arm)   Pulse 83   Temp 98 F (36.7 C) (Oral)   Resp 16   Ht '5\' 2"'  (1.575 m)   Wt 54.4 kg (120 lb)   SpO2 99%   BMI 21.95 kg/m   Physical Exam  Constitutional: She is oriented to person, place, and time. She appears well-developed and well-nourished.  HENT:  Head: Normocephalic and atraumatic.  Cardiovascular: Normal rate and regular rhythm.   No murmur heard. Pulmonary/Chest: Effort normal and breath sounds normal. No respiratory distress.  Abdominal: Soft. There is no tenderness. There is no rebound and no guarding.  Genitourinary:  Genitourinary Comments: Vulvar erythema without edema, lesions, or exudate.  Rectal exam with perianal erythema without edema.  Few external hemorrhoids without edema or induration.  DRE with TTP, no fluctuance.    Musculoskeletal: She exhibits no edema or tenderness.  Neurological: She is  alert and oriented to person, place, and time.  Skin: Skin is warm and dry.  Macular rash to trunk, BLE, no petechia, urticaria, or bullae  Psychiatric: She has a normal mood and affect. Her behavior is normal.  Nursing note and vitals reviewed.    ED Treatments / Results  Labs (all labs ordered are listed, but only abnormal results are displayed) Labs Reviewed  URINALYSIS, ROUTINE W REFLEX MICROSCOPIC - Abnormal; Notable for the following:       Result Value   Color, Urine STRAW (*)    Specific Gravity, Urine 1.002 (*)    Leukocytes, UA TRACE (*)    All other components within normal limits    EKG  EKG Interpretation  None       Radiology No results found.  Procedures Procedures (including critical care time)  Medications Ordered in ED Medications - No data to display   Initial Impression / Assessment and Plan / ED Course  I have reviewed the triage vital signs and the nursing notes.  Pertinent labs & imaging results that were available during my care of the patient were reviewed by me and considered in my medical decision making (see chart for details).    Patient here for evaluation of rectal and vaginal pain after completing her radiation treatments for rectal cancer. Examination with erythema but no evidence of infectious process, mass. Extremities and presentation is concerning for possible proctitis, vaginitis. Counseled pt on local care for symptomatic relief. Discussed outpatient follow-up and return precautions.  Final Clinical Impressions(s) / ED Diagnoses   Final diagnoses:  Rectal pain    New Prescriptions New Prescriptions   OXYCODONE-ACETAMINOPHEN (PERCOCET) 5-325 MG TABLET    Take 1 tablet by mouth every 6 (six) hours as needed for severe pain.     Quintella Reichert, MD 05/13/17 1529

## 2017-05-13 NOTE — ED Triage Notes (Signed)
She c/o rectal pain. She states she has hx of rectal cancer with irradiation therapy. She also tells [place] she has impending appointment to see a surgeon for this. She states she is being seen for rectal pain. She denies fever/bleeding.

## 2017-05-13 NOTE — ED Notes (Signed)
Case management into room.

## 2017-05-13 NOTE — Discharge Instructions (Signed)
You can use plain aloe vera or vagisil, available over the counter, as needed for vaginal irritation.

## 2017-05-13 NOTE — Progress Notes (Signed)
Pt requesting medication assistance.  Pt is seen at [facility], went to last appointment that was scheduled but the staff was unable to find her.  [facility] was unable to provide a follow up until Aug. Made a follow up appointment at the [facility] for May 18, 2017 at 1:30 pm. Pt was advised to go to [place] directly after leaving the ED today to pick up her prescriptions. Pt provided information at time of D/C.  No further CM needs noted at this time.

## 2017-05-13 NOTE — ED Notes (Signed)
Spoke with case management. Patient is not to discharge yet. Case management will come back around and let me know.

## 2017-05-13 NOTE — ED Notes (Signed)
Bed: ZG01 Expected date:  Expected time:  Means of arrival:  Comments: EMS/cancer/pain

## 2017-05-13 NOTE — Progress Notes (Signed)
°  Radiation Oncology         [phone] ________________________________  Name: Sherry Baker MRN: [id number]  Date: 05/01/2017  DOB: 1966-06-29  End of Treatment Note  Diagnosis:   Rectal cancer-distal rectal mass noted on colonoscopy 01/21/2017 with a biopsy confirming adenocarcinoma     Indication for treatment:  curative       Radiation treatment dates:   04/09/2017 to 05/01/2017  Site/dose:    1. The rectum was treated to 25.2 Gy in 14 fractions at 1.8 Gy per fraction. (Total of 45 Gy including patient's treatment prior to a break due to transporatation/ social issues.) 2. The rectum was boosted to 5.4 Gy in 3 fractions at 1.8 Gy per fraction. (Total 50.4 Gy including boost.)  Beams/energy:    1. 15X, 6X // 3D 2. 15X, 6X // photon boost  Narrative: The patient tolerated radiation treatment relatively well.   Patient denies pain and reports normal bowels. Denies nausea or bladder issues.   Plan: The patient has completed radiation treatment. The patient will return to radiation oncology clinic for routine followup in one month. I advised them to call or return sooner if they have any questions or concerns related to their recovery or treatment.  ------------------------------------------------  Jodelle Gross, MD, PhD  This document serves as a record of services personally performed by Kyung Rudd, MD. It was created on his behalf by Arlyce Harman, a trained medical scribe. The creation of this record is based on the scribe's personal observations and the provider's statements to them. This document has been checked and approved by the attending provider.

## 2017-05-18 ENCOUNTER — Inpatient Hospital Stay (INDEPENDENT_AMBULATORY_CARE_PROVIDER_SITE_OTHER): Payer: Self-pay | Admitting: Physician Assistant

## 2017-05-25 ENCOUNTER — Ambulatory Visit: Payer: Self-pay | Admitting: Oncology

## 2017-05-25 ENCOUNTER — Telehealth: Payer: Self-pay | Admitting: Oncology

## 2017-05-25 NOTE — Telephone Encounter (Signed)
sw pt to confirm 8/2 appt at 0800 per sch msg. Pt stated she could not come that early due to riding the bus. Pt want MD appt on 8/16 along with Radonc appt. Informed pt Md appt would be at 1030 am. Pt understood and took appt 8/16 at 1030

## 2017-06-02 ENCOUNTER — Other Ambulatory Visit: Payer: Self-pay | Admitting: General Surgery

## 2017-06-02 MED FILL — LIDOCAINE 5% OINTMENT: 5 | 14 days supply | Qty: 30 | Fill #0

## 2017-06-02 MED FILL — NEOMYCIN 500 MG TABLET: 500 | 1 days supply | Qty: 6 | Fill #0

## 2017-06-02 MED FILL — metroNIDAZOLE 500 MG TABS: 500 | 1 days supply | Qty: 6 | Fill #0

## 2017-06-02 NOTE — H&P (Signed)
History of Present Illness Sherry Ruff MD; 02/19/3789 9:18 AM) The patient is a 51 year old female who presents with colorectal cancer. 50 year old female who presented to the hospital several weeks ago with constipation and rectal bleeding. She was found to have a distal rectal mass and underwent colonoscopy which confirmed a rectal adenocarcinoma. CT scan showed no signs of metastatic disease but a bulky distal rectal mass is present. MRI shows T3 tomor near sphincter complex. She reports minimal rectal bleeding with bowel movements but is having pain. She reports no history of constipation. She underwent a fractionated dose of ChemoRT.    Problem List/Past Medical Sherry Ruff, MD; 2/40/9735 9:15 AM) RECTAL CANCER (C[facility])  Past Surgical History Sherry Ruff, MD; 01/29/9241 9:15 AM) No pertinent past surgical history  Diagnostic Studies History Sherry Ruff, MD; 6/83/4196 9:15 AM) Colonoscopy never Mammogram never Pap Smear >5 years ago  Allergies Mammie Lorenzo, LPN; 12/25/9796 9:21 AM) Penicillins Hives, Swelling.  Medication History Sherry Ruff, MD; 1/94/1740 9:15 AM) Aspirin (325MG  Tablet, Oral as needed) Active. Medications Reconciled Lidocaine (5% Ointment, 1 application External three times daily, as needed, Taken starting 06/02/2017) Active.   Social History Sherry Ruff, MD; 06/16/4817 9:15 AM) Alcohol use Occasional alcohol use. Illicit drug use Uses socially only. No caffeine use Tobacco use Former smoker.  Family History Sherry Ruff, MD; 5/63/1497 9:15 AM) Colon Polyps Family Members In General, Mother. Migraine Headache Father.  Pregnancy / Birth History Sherry Ruff, MD; 0/26/3785 9:15 AM) Age at menarche 73 years. Age of menopause <47 Gravida 2 Irregular periods Maternal age 18-35 Para 2  Other Problems Sherry Ruff, MD; 8/85/0277 9:15 AM) Arthritis Back Pain Colon Cancer Depression Transfusion  history    Vitals Mammie Lorenzo LPN; 02/12/8785 7:67 AM) 06/02/2017 8:56 AM Weight: 115.8 lb Height: 62in Body Surface Area: 1.52 m Body Mass Index: 21.18 kg/m  Temp.: 49F(Oral)  Pulse: 94 (Regular)  BP: 122/84 (Sitting, Left Arm, Standard)      Physical Exam Sherry Ruff MD; 12/13/4707 9:19 AM)  General Mental Status-Alert. General Appearance-Not in acute distress. Build & Nutrition-Well nourished. Posture-Normal posture. Gait-Normal.  Head and Neck Head-normocephalic, atraumatic with no lesions or palpable masses. Trachea-midline.  Chest and Lung Exam Chest and lung exam reveals -on auscultation, normal breath sounds, no adventitious sounds and normal vocal resonance.  Cardiovascular Cardiovascular examination reveals -normal heart sounds, regular rate and rhythm with no murmurs and no digital clubbing, cyanosis, edema, increased warmth or tenderness.  Abdomen Inspection Inspection of the abdomen reveals - No Hernias. Palpation/Percussion Palpation and Percussion of the abdomen reveal - Soft, Non Tender, No Rigidity (guarding), No hepatosplenomegaly and No Palpable abdominal masses.  Rectal Anorectal Exam Internal - Note: Posterior rectal mass abutting the pelvic floor, non-mobile, extends up the left side, extends into the posterior anal canal.  Neurologic Neurologic evaluation reveals -alert and oriented x 3 with no impairment of recent or remote memory, normal attention span and ability to concentrate, normal sensation and normal coordination.  Musculoskeletal Normal Exam - Bilateral-Upper Extremity Strength Normal and Lower Extremity Strength Normal.    Assessment & Plan Sherry Ruff MD; 04/30/3661 9:21 AM)  RECTAL CANCER (C20) Impression: 51 year old female who presents to the office for evaluation of a large distal rectal cancer. She has underwent a fractionated course of chemotherapy and radiation which completed  at the end of June 2018. Since that time she has had worsening rectal pain. On exam she has a posterior midline anal fissure and rectal exam shows a large bulky  mass extending down into the posterior anal canal. I have recommended an abdominal perineal resection. We will try to get this scheduled within the next couple weeks. I have given her diltiazem ointment and lidocaine ointment for fissure. The surgery and anatomy were described to the patient as well as the risks of surgery and the possible complications. These include: Bleeding, deep abdominal infections and possible wound complications such as hernia and infection, damage to adjacent structures, leak of surgical connections, which can lead to other surgeries and possibly an ostomy, possible need for other procedures, such as abscess drains in radiology, possible prolonged hospital stay, possible diarrhea from removal of part of the colon, possible constipation from narcotics, possible bowel, bladder or sexual dysfunction if having rectal surgery, prolonged fatigue/weakness or appetite loss, possible early recurrence of of disease, possible complications of their medical problems such as heart disease or arrhythmias or lung problems, death (less than 1%). I believe the patient understands and wishes to proceed with the surgery.

## 2017-06-04 ENCOUNTER — Ambulatory Visit: Payer: Self-pay | Admitting: Oncology

## 2017-06-04 ENCOUNTER — Telehealth: Payer: Self-pay | Admitting: *Deleted

## 2017-06-04 ENCOUNTER — Other Ambulatory Visit: Payer: Self-pay | Admitting: Radiation Oncology

## 2017-06-04 DIAGNOSIS — C20 Malignant neoplasm of rectum: Secondary | ICD-10-CM

## 2017-06-04 MED ORDER — OXYCODONE-ACETAMINOPHEN 5-325 MG PO TABS: 1.0000 | ORAL_TABLET | Freq: Three times a day (TID) | ORAL | 0 refills | Status: DC | PRN

## 2017-06-04 MED FILL — OXYCODONE-ACETAMINOPHEN 5-3: 5-325 | 10 days supply | Qty: 30 | Fill #0

## 2017-06-04 NOTE — Telephone Encounter (Signed)
Patient called left voice message asking for pain medication  Stating her rectum is hurting real bad, checked her last rx was written 7/11/187 for 12 percocets only by Dr.Rees, Benjamine Mola, patient follow up appt is scheduled for 06/18/17, will send inbasket to our [facility], will call patient back later today 10:02 AM

## 2017-06-04 NOTE — Telephone Encounter (Signed)
Returned patient call from this am, patient requesting pain medication , but she or someone must bring a  with photo identification to pick up oxycodone, and if she is willing to come on 06/18/17 at 9:30 am instead of 3:30pm, as she has an appt with Dr. Benay Spice that day at 43 am, patient called back and stated her surgery is scheduled 06/18/17 qat 7am, will need to cancel her appt with Bryson Ha and Dr. Benay Spice, she will be here today to pick up rx,thanked  Bryson Ha will be here before 5pm today 2:15 PM

## 2017-06-05 NOTE — Progress Notes (Signed)
01-17-17 (EPIC) EKG 01-19-17 (EPIC) CXR

## 2017-06-05 NOTE — Patient Instructions (Addendum)
Sherry Baker  06/05/2017   Your procedure is scheduled on: 06-18-17   Report to [facility] Main  Entrance Take [place]  elevators to 3rd floor to  [place] at 7:00 AM.   Call this number if you have problems the morning of surgery [phone]    Remember: ONLY 1 PERSON MAY GO WITH YOU TO SHORT STAY TO GET  READY MORNING OF [place].  Eat a Clear Liquid Diet the day of prep to prevent dehydration.  Do not eat food or drink liquids :After Midnight.     CLEAR LIQUID DIET   Foods Allowed                                                                     Foods Excluded  Coffee and tea, regular and decaf                             liquids that you cannot  Plain Jell-O in any flavor                                             see through such as: Fruit ices (not with fruit pulp)                                     milk, soups, orange juice  Iced Popsicles                                    All solid food Carbonated beverages, regular and diet                                    Cranberry, grape and apple juices Sports drinks like Gatorade Lightly seasoned clear broth or consume(fat free) Sugar, honey syrup  Sample Menu Breakfast                                Lunch                                     Supper Cranberry juice                    Beef broth                            Chicken broth Jell-O                                     Grape juice  Apple juice Coffee or tea                        Jell-O                                      Popsicle                                                Coffee or tea                        Coffee or tea  _____________________________________________________________________     Take these medicines the morning of surgery with A SIP OF WATER: None                               You may not have any metal on your body including hair pins and              piercings  Do not wear jewelry,  make-up, lotions, powders or perfumes, deodorant             Do not wear nail polish.  Do not shave  48 hours prior to surgery.                 Do not bring valuables to the hospital. [place].  Contacts, dentures or bridgework may not be worn into surgery.  Leave suitcase in the car. After surgery it may be brought to your room.                  Please read over the following fact sheets you were given: _____________________________________________________________________             [facility] - Preparing for Surgery Before surgery, you can play an important role.  Because skin is not sterile, your skin needs to be as free of germs as possible.  You can reduce the number of germs on your skin by washing with CHG (chlorahexidine gluconate) soap before surgery.  CHG is an antiseptic cleaner which kills germs and bonds with the skin to continue killing germs even after washing. Please DO NOT use if you have an allergy to CHG or antibacterial soaps.  If your skin becomes reddened/irritated stop using the CHG and inform your nurse when you arrive at Short Stay. Do not shave (including legs and underarms) for at least 48 hours prior to the first CHG shower.  You may shave your face/neck. Please follow these instructions carefully:  1.  Shower with CHG Soap the night before surgery and the  morning of Surgery.  2.  If you choose to wash your hair, wash your hair first as usual with your  normal  shampoo.  3.  After you shampoo, rinse your hair and body thoroughly to remove the  shampoo.                           4.  Use CHG as you would any other liquid soap.  You can apply  chg directly  to the skin and wash                       Gently with a scrungie or clean washcloth.  5.  Apply the CHG Soap to your body ONLY FROM THE NECK DOWN.   Do not use on face/ open                           Wound or open sores. Avoid contact with eyes, ears mouth and  genitals (private parts).                       Wash face,  Genitals (private parts) with your normal soap.             6.  Wash thoroughly, paying special attention to the area where your surgery  will be performed.  7.  Thoroughly rinse your body with warm water from the neck down.  8.  DO NOT shower/wash with your normal soap after using and rinsing off  the CHG Soap.                9.  Pat yourself dry with a clean towel.            10.  Wear clean pajamas.            11.  Place clean sheets on your bed the night of your first shower and do not  sleep with pets. Day of Surgery : Do not apply any lotions/deodorants the morning of surgery.  Please wear clean clothes to the hospital/surgery center.  FAILURE TO FOLLOW THESE INSTRUCTIONS MAY RESULT IN THE CANCELLATION OF YOUR SURGERY PATIENT SIGNATURE_________________________________  NURSE SIGNATURE__________________________________  ________________________________________________________________________   Sherry Baker  An incentive spirometer is a tool that can help keep your lungs clear and active. This tool measures how well you are filling your lungs with each breath. Taking long deep breaths may help reverse or decrease the chance of developing breathing (pulmonary) problems (especially infection) following:  A long period of time when you are unable to move or be active. BEFORE THE PROCEDURE   If the spirometer includes an indicator to show your best effort, your nurse or respiratory therapist will set it to a desired goal.  If possible, sit up straight or lean slightly forward. Try not to slouch.  Hold the incentive spirometer in an upright position. INSTRUCTIONS FOR USE  1. Sit on the edge of your bed if possible, or sit up as far as you can in bed or on a chair. 2. Hold the incentive spirometer in an upright position. 3. Breathe out normally. 4. Place the mouthpiece in your mouth and seal your lips tightly around  it. 5. Breathe in slowly and as deeply as possible, raising the piston or the ball toward the top of the column. 6. Hold your breath for 3-5 seconds or for as long as possible. Allow the piston or ball to fall to the bottom of the column. 7. Remove the mouthpiece from your mouth and breathe out normally. 8. Rest for a few seconds and repeat Steps 1 through 7 at least 10 times every 1-2 hours when you are awake. Take your time and take a few normal breaths between deep breaths. 9. The spirometer may include an indicator to show your best effort. Use the indicator as a goal to work toward during each  repetition. 10. After each set of 10 deep breaths, practice coughing to be sure your lungs are clear. If you have an incision (the cut made at the time of surgery), support your incision when coughing by placing a pillow or rolled up towels firmly against it. Once you are able to get out of bed, walk around indoors and cough well. You may stop using the incentive spirometer when instructed by your caregiver.  RISKS AND COMPLICATIONS  Take your time so you do not get dizzy or light-headed.  If you are in pain, you may need to take or ask for pain medication before doing incentive spirometry. It is harder to take a deep breath if you are having pain. AFTER USE  Rest and breathe slowly and easily.  It can be helpful to keep track of a log of your progress. Your caregiver can provide you with a simple table to help with this. If you are using the spirometer at home, follow these instructions: [place] IF:   You are having difficultly using the spirometer.  You have trouble using the spirometer as often as instructed.  Your pain medication is not giving enough relief while using the spirometer.  You develop fever of 100.5 F (38.1 C) or higher. SEEK IMMEDIATE MEDICAL CARE IF:   You cough up bloody sputum that had not been present before.  You develop fever of 102 F (38.9 C) or  greater.  You develop worsening pain at or near the incision site. MAKE SURE YOU:   Understand these instructions.  Will watch your condition.  Will get help right away if you are not doing well or get worse. Document Released: 03/02/2007 Document Revised: 01/12/2012 Document Reviewed: 05/03/2007 [facility] Patient Information 2014 [place], [place].   ________________________________________________________________________

## 2017-06-10 ENCOUNTER — Encounter (HOSPITAL_COMMUNITY)
Admission: RE | Admit: 2017-06-10 | Discharge: 2017-06-10 | Disposition: A | Discharge status: Home / Self Care | Payer: Medicaid Other | Source: Ambulatory Visit | Attending: General Surgery | Admitting: General Surgery

## 2017-06-10 ENCOUNTER — Encounter (HOSPITAL_COMMUNITY): Payer: Self-pay

## 2017-06-10 ENCOUNTER — Encounter (INDEPENDENT_AMBULATORY_CARE_PROVIDER_SITE_OTHER): Payer: Self-pay

## 2017-06-10 ENCOUNTER — Other Ambulatory Visit (HOSPITAL_COMMUNITY): Payer: Self-pay

## 2017-06-10 DIAGNOSIS — Z01812 Encounter for preprocedural laboratory examination: Secondary | ICD-10-CM | POA: Insufficient documentation

## 2017-06-10 DIAGNOSIS — R9431 Abnormal electrocardiogram [ECG] [EKG]: Secondary | ICD-10-CM | POA: Insufficient documentation

## 2017-06-10 DIAGNOSIS — Z01818 Encounter for other preprocedural examination: Secondary | ICD-10-CM | POA: Diagnosis not present

## 2017-06-10 DIAGNOSIS — I10 Essential (primary) hypertension: Secondary | ICD-10-CM | POA: Diagnosis not present

## 2017-06-10 DIAGNOSIS — C20 Malignant neoplasm of rectum: Secondary | ICD-10-CM | POA: Diagnosis not present

## 2017-06-10 LAB — RAPID URINE DRUG SCREEN, HOSP PERFORMED
Amphetamines: NOT DETECTED
BENZODIAZEPINES: NOT DETECTED
Barbiturates: NOT DETECTED
COCAINE: POSITIVE — AB
OPIATES: NOT DETECTED

## 2017-06-10 LAB — COMPREHENSIVE METABOLIC PANEL
ALK PHOS: 63 U/L (ref 38–126)
ALT: 27 U/L (ref 14–54)
ANION GAP: 8 (ref 5–15)
AST: 39 U/L (ref 15–41)
Albumin: 3.5 g/dL (ref 3.5–5.0)
BUN: 7 mg/dL (ref 6–20)
CALCIUM: 9.2 mg/dL (ref 8.9–10.3)
CHLORIDE: 101 mmol/L (ref 101–111)
CO2: 28 mmol/L (ref 22–32)
CREATININE: 0.45 mg/dL (ref 0.44–1.00)
Glucose, Bld: 97 mg/dL (ref 65–99)
Potassium: 4.3 mmol/L (ref 3.5–5.1)
SODIUM: 137 mmol/L (ref 135–145)
Total Bilirubin: 0.7 mg/dL (ref 0.3–1.2)
Total Protein: 7.9 g/dL (ref 6.5–8.1)

## 2017-06-10 LAB — CBC
HEMATOCRIT: 37.8 % (ref 36.0–46.0)
Hemoglobin: 12.7 g/dL (ref 12.0–15.0)
MCH: 31.4 pg (ref 26.0–34.0)
MCHC: 33.6 g/dL (ref 30.0–36.0)
MCV: 93.3 fL (ref 78.0–100.0)
PLATELETS: 143 10*3/uL — AB (ref 150–400)
RBC: 4.05 MIL/uL (ref 3.87–5.11)
RDW: 16.1 % — AB (ref 11.5–15.5)
WBC: 4.2 10*3/uL (ref 4.0–10.5)

## 2017-06-10 LAB — SURGICAL PCR SCREEN
MRSA, PCR: POSITIVE — AB
STAPHYLOCOCCUS AUREUS: POSITIVE — AB

## 2017-06-10 NOTE — Consult Note (Signed)
[facility] Nurse ostomy consult note [place] Nurse requested for preoperative stoma site marking by Dr. Marcello Moores.  Discussed surgical procedure and stoma creation with patient.  Explained role of the [place] nurse team.  Answered patient questions.   Examined patient sitting and standing in order to place the marking in the patient's visual field, away from any creases or abdominal contour issues and within the rectus muscle.  Attempted to mark below the patient's belt line. There is a deep crease at the umbilicus.  Marked for colostomy in the LLQ  6.0 cm to the left of the umbilicus and 2.0 cm below the umbilicus.  Marked for ileostomy in the RLQ  6.0 cm to the right of the umbilicus and  3.5 cm above the umbilicus.   Patient's abdomen cleansed with CHG wipes at site markings, allowed to air dry prior to marking. Covered mark with thin film transparent dressing to preserve mark until date of surgery (June 18, 2017).    [place] nursing team will follow and will remain available to this patient, the nursing, surgical and medical teams.   Thanks, Maudie Flakes, MSN, RN, [place], Arther Abbott  Pager# [phone]

## 2017-06-11 NOTE — Progress Notes (Addendum)
Pt positive for MRSA and Staph. MD notified, prescription called to pharmacy. Left voicemail for pt. Awaiting return call.  06-12-17 Left 2nd callback message for pt.

## 2017-06-11 NOTE — Progress Notes (Signed)
06-11-17 MRSA, Urine Rapid Result routed to Dr. Marcello Moores for review.

## 2017-06-11 NOTE — Progress Notes (Signed)
Late entry: Pt with Hx of HTN, BP during preadmit appt 150/103, BP repeated w/ result of 144/100. Pt reported that she is no longer taking her Coreg, and has also stopped taking all of her medication. She state that she currently sees Dr. Ena Dawley at [facility] [place] [facility].   Pt also has a history of Cocaine/Marijuana. Stated that she last used 2-4 months ago. Also, questionable use of alcohol prior to preadmit appt, based on breathe odor. Spoke to Dr. Tobias Alexander regarding the above. Order received for a Rapid Urine Test now, and if it yields significant results to repeat the day of surgery.  Rapid Urine Test was positive for Cocaine. Received a call from  'Ace' in lab to indicate that their was some interfering substance. As a result,  they could not determine if Tetrahydrocannabinol was present. "Ace" indicated that he would release the result.  Based on Per Dr. Tobias Alexander, we will repeat Urine rapid test the day of surgery.

## 2017-06-18 ENCOUNTER — Ambulatory Visit: Admission: RE | Admit: 2017-06-18 | Payer: Self-pay | Source: Ambulatory Visit | Admitting: Radiation Oncology

## 2017-06-18 ENCOUNTER — Encounter (HOSPITAL_COMMUNITY): Payer: Self-pay | Admitting: *Deleted

## 2017-06-18 ENCOUNTER — Inpatient Hospital Stay (HOSPITAL_COMMUNITY)
Admission: RE | Admit: 2017-06-18 | Discharge: 2017-06-22 | DRG: 330 | Disposition: A | Discharge status: Home Health | Payer: Medicaid Other | Source: Ambulatory Visit | Attending: General Surgery | Admitting: General Surgery

## 2017-06-18 ENCOUNTER — Encounter (HOSPITAL_COMMUNITY)
Admission: RE | Disposition: A | Discharge status: Home Health | Payer: Self-pay | Source: Ambulatory Visit | Attending: General Surgery

## 2017-06-18 ENCOUNTER — Inpatient Hospital Stay (HOSPITAL_COMMUNITY): Payer: Medicaid Other | Admitting: Anesthesiology

## 2017-06-18 ENCOUNTER — Ambulatory Visit: Payer: Self-pay | Admitting: Oncology

## 2017-06-18 DIAGNOSIS — M1990 Unspecified osteoarthritis, unspecified site: Secondary | ICD-10-CM | POA: Diagnosis present

## 2017-06-18 DIAGNOSIS — I10 Essential (primary) hypertension: Secondary | ICD-10-CM | POA: Diagnosis present

## 2017-06-18 DIAGNOSIS — Z87891 Personal history of nicotine dependence: Secondary | ICD-10-CM | POA: Diagnosis not present

## 2017-06-18 DIAGNOSIS — F319 Bipolar disorder, unspecified: Secondary | ICD-10-CM | POA: Diagnosis present

## 2017-06-18 DIAGNOSIS — F1420 Cocaine dependence, uncomplicated: Secondary | ICD-10-CM | POA: Diagnosis present

## 2017-06-18 DIAGNOSIS — C19 Malignant neoplasm of rectosigmoid junction: Principal | ICD-10-CM | POA: Diagnosis present

## 2017-06-18 DIAGNOSIS — K602 Anal fissure, unspecified: Secondary | ICD-10-CM | POA: Diagnosis present

## 2017-06-18 DIAGNOSIS — M549 Dorsalgia, unspecified: Secondary | ICD-10-CM | POA: Diagnosis present

## 2017-06-18 DIAGNOSIS — K219 Gastro-esophageal reflux disease without esophagitis: Secondary | ICD-10-CM | POA: Diagnosis present

## 2017-06-18 DIAGNOSIS — Z880 Allergy status to penicillin: Secondary | ICD-10-CM

## 2017-06-18 DIAGNOSIS — C20 Malignant neoplasm of rectum: Secondary | ICD-10-CM | POA: Diagnosis present

## 2017-06-18 DIAGNOSIS — E871 Hypo-osmolality and hyponatremia: Secondary | ICD-10-CM | POA: Diagnosis present

## 2017-06-18 HISTORY — PX: ABDOMINAL PERINEAL BOWEL RESECTION: SHX1111

## 2017-06-18 LAB — RAPID URINE DRUG SCREEN, HOSP PERFORMED
Amphetamines: NOT DETECTED
BARBITURATES: NOT DETECTED
Benzodiazepines: NOT DETECTED
Cocaine: POSITIVE — AB
OPIATES: NOT DETECTED

## 2017-06-18 LAB — CBC
HEMATOCRIT: 33.9 % — AB (ref 36.0–46.0)
Hemoglobin: 11.4 g/dL — ABNORMAL LOW (ref 12.0–15.0)
MCH: 30.9 pg (ref 26.0–34.0)
MCHC: 33.6 g/dL (ref 30.0–36.0)
MCV: 91.9 fL (ref 78.0–100.0)
PLATELETS: 143 10*3/uL — AB (ref 150–400)
RBC: 3.69 MIL/uL — ABNORMAL LOW (ref 3.87–5.11)
RDW: 15.9 % — AB (ref 11.5–15.5)
WBC: 10.5 10*3/uL (ref 4.0–10.5)

## 2017-06-18 LAB — CREATININE, SERUM
Creatinine, Ser: 0.52 mg/dL (ref 0.44–1.00)
GFR calc Af Amer: >60 mL/min (ref >60)

## 2017-06-18 SURGERY — PROCTECTOMY, ABDOMINOPERINEAL
Anesthesia: General | Site: Abdomen

## 2017-06-18 MED ORDER — LIDOCAINE 2% (20 MG/ML) 5 ML SYRINGE
INTRAMUSCULAR | Status: DC | PRN
  Administered 2017-06-18: 1.5 mg/kg/h via INTRAVENOUS

## 2017-06-18 MED ORDER — LACTATED RINGERS IV SOLN
INTRAVENOUS | Status: DC
  Administered 2017-06-18 (×4): via INTRAVENOUS

## 2017-06-18 MED ORDER — FENTANYL CITRATE (PF) 100 MCG/2ML IJ SOLN
INTRAMUSCULAR | Status: AC
  Filled 2017-06-18: qty 2

## 2017-06-18 MED ORDER — HYDRALAZINE HCL 20 MG/ML IJ SOLN
5.0000 mg | Freq: Once | INTRAMUSCULAR | Status: AC
  Administered 2017-06-18: 5 mg via INTRAVENOUS

## 2017-06-18 MED ORDER — HYDRALAZINE HCL 20 MG/ML IJ SOLN
INTRAMUSCULAR | Status: AC
  Filled 2017-06-18: qty 1

## 2017-06-18 MED ORDER — GABAPENTIN 300 MG PO CAPS
300.0000 mg | ORAL_CAPSULE | ORAL | Status: AC
  Administered 2017-06-18: 300 mg via ORAL
  Filled 2017-06-18: qty 1

## 2017-06-18 MED ORDER — ROCURONIUM BROMIDE 10 MG/ML (PF) SYRINGE
PREFILLED_SYRINGE | INTRAVENOUS | Status: DC | PRN
  Administered 2017-06-18 (×2): 10 mg via INTRAVENOUS
  Administered 2017-06-18: 30 mg via INTRAVENOUS
  Administered 2017-06-18 (×2): 10 mg via INTRAVENOUS

## 2017-06-18 MED ORDER — LIDOCAINE 2% (20 MG/ML) 5 ML SYRINGE
INTRAMUSCULAR | Status: AC
  Filled 2017-06-18: qty 5

## 2017-06-18 MED ORDER — MORPHINE SULFATE 2 MG/ML IV SOLN
INTRAVENOUS | Status: DC
  Administered 2017-06-18: 14:00:00 via INTRAVENOUS
  Administered 2017-06-18: 6 mg via INTRAVENOUS
  Administered 2017-06-19: 21:00:00 via INTRAVENOUS
  Administered 2017-06-19: 4.5 mg via INTRAVENOUS
  Administered 2017-06-19: 6 mg via INTRAVENOUS
  Administered 2017-06-19: 3 mg via INTRAVENOUS
  Administered 2017-06-19: 6 mg via INTRAVENOUS
  Administered 2017-06-19: 12 mg via INTRAVENOUS
  Administered 2017-06-19: 1.5 mg via INTRAVENOUS
  Administered 2017-06-20: 18 mg via INTRAVENOUS
  Administered 2017-06-20: 8.65 mg via INTRAVENOUS
  Administered 2017-06-20: 7.5 mg via INTRAVENOUS
  Administered 2017-06-20: 16:00:00 via INTRAVENOUS
  Administered 2017-06-20: 9 mg via INTRAVENOUS
  Administered 2017-06-20: 7.5 mg via INTRAVENOUS
  Administered 2017-06-21: 4.5 mg via INTRAVENOUS
  Administered 2017-06-21: 15 mg via INTRAVENOUS
  Administered 2017-06-21: 4.5 mg via INTRAVENOUS
  Filled 2017-06-18 (×3): qty 30

## 2017-06-18 MED ORDER — CELECOXIB 200 MG PO CAPS
200.0000 mg | ORAL_CAPSULE | Freq: Two times a day (BID) | ORAL | Status: DC
  Administered 2017-06-18 – 2017-06-22 (×8): 200 mg via ORAL
  Filled 2017-06-18 (×8): qty 1

## 2017-06-18 MED ORDER — 0.9 % SODIUM CHLORIDE (POUR BTL) OPTIME
TOPICAL | Status: DC | PRN
  Administered 2017-06-18: 3000 mL

## 2017-06-18 MED ORDER — DEXTROSE 5 % IV SOLN
5.0000 mg/kg | INTRAVENOUS | Status: AC
  Administered 2017-06-18: 270 mg via INTRAVENOUS
  Filled 2017-06-18: qty 6.75

## 2017-06-18 MED ORDER — LIDOCAINE 2% (20 MG/ML) 5 ML SYRINGE
INTRAMUSCULAR | Status: DC | PRN
  Administered 2017-06-18: 100 mg via INTRAVENOUS

## 2017-06-18 MED ORDER — SODIUM CHLORIDE 0.9% FLUSH: 9.0000 mL | INTRAVENOUS | Status: DC | PRN

## 2017-06-18 MED ORDER — SUCCINYLCHOLINE CHLORIDE 200 MG/10ML IV SOSY
PREFILLED_SYRINGE | INTRAVENOUS | Status: AC
  Filled 2017-06-18: qty 10

## 2017-06-18 MED ORDER — HYDROMORPHONE HCL-NACL 0.5-0.9 MG/ML-% IV SOSY: 0.2500 mg | PREFILLED_SYRINGE | INTRAVENOUS | Status: DC | PRN

## 2017-06-18 MED ORDER — ALVIMOPAN 12 MG PO CAPS
12.0000 mg | ORAL_CAPSULE | Freq: Two times a day (BID) | ORAL | Status: DC
  Administered 2017-06-19 – 2017-06-21 (×5): 12 mg via ORAL
  Filled 2017-06-18 (×6): qty 1

## 2017-06-18 MED ORDER — KETAMINE HCL 10 MG/ML IJ SOLN
INTRAMUSCULAR | Status: DC | PRN
  Administered 2017-06-18: 10 mg via INTRAVENOUS
  Administered 2017-06-18: 20 mg via INTRAVENOUS

## 2017-06-18 MED ORDER — ONDANSETRON HCL 4 MG/2ML IJ SOLN
INTRAMUSCULAR | Status: AC
Start: 2017-06-18 — End: ?
  Filled 2017-06-18: qty 2

## 2017-06-18 MED ORDER — DIPHENHYDRAMINE HCL 12.5 MG/5ML PO ELIX: 12.5000 mg | ORAL_SOLUTION | Freq: Four times a day (QID) | ORAL | Status: DC | PRN

## 2017-06-18 MED ORDER — MEPERIDINE HCL 50 MG/ML IJ SOLN: 6.2500 mg | INTRAMUSCULAR | Status: DC | PRN

## 2017-06-18 MED ORDER — CARVEDILOL 6.25 MG PO TABS
6.2500 mg | ORAL_TABLET | Freq: Two times a day (BID) | ORAL | Status: DC
  Administered 2017-06-19 – 2017-06-22 (×6): 6.25 mg via ORAL
  Filled 2017-06-18 (×7): qty 1

## 2017-06-18 MED ORDER — CLINDAMYCIN PHOSPHATE 900 MG/50ML IV SOLN
900.0000 mg | INTRAVENOUS | Status: AC
  Administered 2017-06-18: 900 mg via INTRAVENOUS
  Filled 2017-06-18: qty 50

## 2017-06-18 MED ORDER — HEPARIN SODIUM (PORCINE) 5000 UNIT/ML IJ SOLN
5000.0000 [IU] | Freq: Once | INTRAMUSCULAR | Status: AC
  Administered 2017-06-18: 5000 [IU] via SUBCUTANEOUS
  Filled 2017-06-18: qty 1

## 2017-06-18 MED ORDER — ACETAMINOPHEN 500 MG PO TABS
1000.0000 mg | ORAL_TABLET | ORAL | Status: AC
  Administered 2017-06-18: 1000 mg via ORAL
  Filled 2017-06-18: qty 2

## 2017-06-18 MED ORDER — KETAMINE HCL 10 MG/ML IJ SOLN
INTRAMUSCULAR | Status: AC
  Filled 2017-06-18: qty 1

## 2017-06-18 MED ORDER — DIPHENHYDRAMINE HCL 50 MG/ML IJ SOLN: 12.5000 mg | Freq: Four times a day (QID) | INTRAMUSCULAR | Status: DC | PRN

## 2017-06-18 MED ORDER — CELECOXIB 200 MG PO CAPS
400.0000 mg | ORAL_CAPSULE | ORAL | Status: AC
  Administered 2017-06-18: 400 mg via ORAL
  Filled 2017-06-18: qty 2

## 2017-06-18 MED ORDER — ROCURONIUM BROMIDE 50 MG/5ML IV SOSY
PREFILLED_SYRINGE | INTRAVENOUS | Status: AC
  Filled 2017-06-18: qty 5

## 2017-06-18 MED ORDER — NALOXONE HCL 0.4 MG/ML IJ SOLN: 0.4000 mg | INTRAMUSCULAR | Status: DC | PRN

## 2017-06-18 MED ORDER — ALVIMOPAN 12 MG PO CAPS
12.0000 mg | ORAL_CAPSULE | ORAL | Status: AC
  Administered 2017-06-18: 12 mg via ORAL
  Filled 2017-06-18: qty 1

## 2017-06-18 MED ORDER — SUGAMMADEX SODIUM 200 MG/2ML IV SOLN
INTRAVENOUS | Status: AC
  Filled 2017-06-18: qty 2

## 2017-06-18 MED ORDER — CLINDAMYCIN PHOSPHATE 900 MG/50ML IV SOLN
900.0000 mg | Freq: Three times a day (TID) | INTRAVENOUS | Status: AC
  Administered 2017-06-18: 900 mg via INTRAVENOUS
  Filled 2017-06-18: qty 50

## 2017-06-18 MED ORDER — GABAPENTIN 300 MG PO CAPS
300.0000 mg | ORAL_CAPSULE | Freq: Two times a day (BID) | ORAL | Status: DC
  Administered 2017-06-18 – 2017-06-22 (×8): 300 mg via ORAL
  Filled 2017-06-18 (×8): qty 1

## 2017-06-18 MED ORDER — METHYLENE BLUE 0.5 % INJ SOLN
INTRAVENOUS | Status: DC | PRN
  Administered 2017-06-18: 5 mL via INTRAVENOUS

## 2017-06-18 MED ORDER — DEXAMETHASONE SODIUM PHOSPHATE 10 MG/ML IJ SOLN
INTRAMUSCULAR | Status: AC
  Filled 2017-06-18: qty 1

## 2017-06-18 MED ORDER — ENOXAPARIN SODIUM 40 MG/0.4ML ~~LOC~~ SOLN
40.0000 mg | SUBCUTANEOUS | Status: DC
  Administered 2017-06-19 – 2017-06-21 (×3): 40 mg via SUBCUTANEOUS
  Filled 2017-06-18 (×3): qty 0.4

## 2017-06-18 MED ORDER — DEXAMETHASONE SODIUM PHOSPHATE 10 MG/ML IJ SOLN
INTRAMUSCULAR | Status: DC | PRN
  Administered 2017-06-18: 10 mg via INTRAVENOUS

## 2017-06-18 MED ORDER — MIDAZOLAM HCL 5 MG/5ML IJ SOLN
INTRAMUSCULAR | Status: DC | PRN
  Administered 2017-06-18: 2 mg via INTRAVENOUS

## 2017-06-18 MED ORDER — ONDANSETRON HCL 4 MG/2ML IJ SOLN
INTRAMUSCULAR | Status: DC | PRN
  Administered 2017-06-18: 4 mg via INTRAVENOUS

## 2017-06-18 MED ORDER — METHYLENE BLUE 0.5 % INJ SOLN
INTRAVENOUS | Status: AC
  Filled 2017-06-18: qty 10

## 2017-06-18 MED ORDER — PROMETHAZINE HCL 25 MG/ML IJ SOLN
INTRAMUSCULAR | Status: AC
  Filled 2017-06-18: qty 1

## 2017-06-18 MED ORDER — MIDAZOLAM HCL 2 MG/2ML IJ SOLN
INTRAMUSCULAR | Status: AC
  Filled 2017-06-18: qty 2

## 2017-06-18 MED ORDER — FENTANYL CITRATE (PF) 100 MCG/2ML IJ SOLN
INTRAMUSCULAR | Status: DC | PRN
  Administered 2017-06-18 (×3): 50 ug via INTRAVENOUS

## 2017-06-18 MED ORDER — ACETAMINOPHEN 500 MG PO TABS
1000.0000 mg | ORAL_TABLET | Freq: Four times a day (QID) | ORAL | Status: DC
  Administered 2017-06-19 – 2017-06-22 (×11): 1000 mg via ORAL
  Filled 2017-06-18 (×12): qty 2

## 2017-06-18 MED ORDER — PROPOFOL 10 MG/ML IV BOLUS
INTRAVENOUS | Status: AC
  Filled 2017-06-18: qty 20

## 2017-06-18 MED ORDER — PROPOFOL 10 MG/ML IV BOLUS
INTRAVENOUS | Status: DC | PRN
  Administered 2017-06-18: 150 mg via INTRAVENOUS

## 2017-06-18 MED ORDER — PROMETHAZINE HCL 25 MG/ML IJ SOLN
6.2500 mg | INTRAMUSCULAR | Status: DC | PRN
  Administered 2017-06-18: 6.25 mg via INTRAVENOUS

## 2017-06-18 MED ORDER — ONDANSETRON HCL 4 MG/2ML IJ SOLN: 4.0000 mg | Freq: Four times a day (QID) | INTRAMUSCULAR | Status: DC | PRN

## 2017-06-18 MED ORDER — OXYCODONE HCL 5 MG/5ML PO SOLN
5.0000 mg | Freq: Once | ORAL | Status: DC | PRN
  Filled 2017-06-18: qty 5

## 2017-06-18 MED ORDER — KCL IN DEXTROSE-NACL 20-5-0.45 MEQ/L-%-% IV SOLN
INTRAVENOUS | Status: DC
  Administered 2017-06-18: 18:00:00 via INTRAVENOUS
  Filled 2017-06-18 (×2): qty 1000

## 2017-06-18 MED ORDER — OXYCODONE HCL 5 MG PO TABS: 5.0000 mg | ORAL_TABLET | Freq: Once | ORAL | Status: DC | PRN

## 2017-06-18 MED ORDER — HYDRALAZINE HCL 20 MG/ML IJ SOLN
10.0000 mg | INTRAMUSCULAR | Status: DC | PRN
  Filled 2017-06-18: qty 0.5

## 2017-06-18 SURGICAL SUPPLY — 64 items
ADH SKN CLS APL DERMABOND .7 (GAUZE/BANDAGES/DRESSINGS)
BLADE EXTENDED COATED 6.5IN (ELECTRODE) ×2 IMPLANT
CELLS DAT CNTRL 66122 CELL SVR (MISCELLANEOUS) IMPLANT
CHLORAPREP W/TINT 26ML (MISCELLANEOUS) ×1 IMPLANT
COUNTER NEEDLE 20 DBL MAG RED (NEEDLE) ×1 IMPLANT
COVER MAYO STAND STRL (DRAPES) ×2 IMPLANT
DECANTER SPIKE VIAL GLASS SM (MISCELLANEOUS) ×1 IMPLANT
DERMABOND ADVANCED (GAUZE/BANDAGES/DRESSINGS)
DERMABOND ADVANCED .7 DNX12 (GAUZE/BANDAGES/DRESSINGS) ×1 IMPLANT
DRAIN CHANNEL 19F RND (DRAIN) ×2 IMPLANT
DRAPE LAPAROSCOPIC ABDOMINAL (DRAPES) ×3 IMPLANT
DRSG OPSITE POSTOP 4X10 (GAUZE/BANDAGES/DRESSINGS) IMPLANT
DRSG OPSITE POSTOP 4X6 (GAUZE/BANDAGES/DRESSINGS) ×2 IMPLANT
DRSG OPSITE POSTOP 4X8 (GAUZE/BANDAGES/DRESSINGS) IMPLANT
ELECT PENCIL ROCKER SW 15FT (MISCELLANEOUS) ×4 IMPLANT
ELECT REM PT RETURN 15FT ADLT (MISCELLANEOUS) ×3 IMPLANT
EVACUATOR SILICONE 100CC (DRAIN) ×2 IMPLANT
GAUZE SPONGE 4X4 12PLY STRL (GAUZE/BANDAGES/DRESSINGS) ×2 IMPLANT
GLOVE BIO SURGEON STRL SZ 6.5 (GLOVE) ×4 IMPLANT
GLOVE BIO SURGEONS STRL SZ 6.5 (GLOVE) ×2
GLOVE BIOGEL PI IND STRL 7.0 (GLOVE) ×2 IMPLANT
GLOVE BIOGEL PI INDICATOR 7.0 (GLOVE) ×12
GOWN STRL REUS W/TWL 2XL LVL3 (GOWN DISPOSABLE) ×6 IMPLANT
GOWN STRL REUS W/TWL XL LVL3 (GOWN DISPOSABLE) ×16 IMPLANT
HOLDER FOLEY CATH W/STRAP (MISCELLANEOUS) ×3 IMPLANT
KIT BASIN OR (CUSTOM PROCEDURE TRAY) ×3 IMPLANT
LEGGING LITHOTOMY PAIR STRL (DRAPES) ×3 IMPLANT
LUBRICANT JELLY K Y 4OZ (MISCELLANEOUS) IMPLANT
PACK COLON (CUSTOM PROCEDURE TRAY) ×3 IMPLANT
PACK GENERAL/GYN (CUSTOM PROCEDURE TRAY) ×3 IMPLANT
PAD ABD 7.5X8 STRL (GAUZE/BANDAGES/DRESSINGS) ×3 IMPLANT
PAD POSITIONING PINK XL (MISCELLANEOUS) ×3 IMPLANT
PAK SCROTO (SET/KITS/TRAYS/PACK) ×2 IMPLANT
POSITIONER SURGICAL ARM (MISCELLANEOUS) ×3 IMPLANT
POUCH DRAINABLE 1PC 2 1/4 FLAT (OSTOMY) ×2 IMPLANT
RETRACTOR LONE STAR DISPOSABLE (INSTRUMENTS) ×2 IMPLANT
RETRACTOR STAY HOOK 5MM (MISCELLANEOUS) IMPLANT
RETRACTOR WND ALEXIS 18 MED (MISCELLANEOUS) IMPLANT
RTRCTR WOUND ALEXIS 18CM MED (MISCELLANEOUS)
SEALER TISSUE G2 STRG ARTC 35C (ENDOMECHANICALS) ×2 IMPLANT
SEALER TISSUE X1 CVD JAW (INSTRUMENTS) ×2 IMPLANT
SPONGE LAP 18X18 X RAY DECT (DISPOSABLE) ×2 IMPLANT
STAPLER PROXIMATE 75MM BLUE (STAPLE) ×2 IMPLANT
STAPLER VISISTAT 35W (STAPLE) ×5 IMPLANT
SUT ETHILON 2 0 PS N (SUTURE) ×2 IMPLANT
SUT NOVA NAB DX-16 0-1 5-0 T12 (SUTURE) ×6 IMPLANT
SUT PDS AB 1 CTX 36 (SUTURE) IMPLANT
SUT PDS AB 1 TP1 96 (SUTURE) IMPLANT
SUT PROLENE 2 0 KS (SUTURE) IMPLANT
SUT SILK 2 0 (SUTURE) ×3
SUT SILK 2 0 SH CR/8 (SUTURE) ×3 IMPLANT
SUT SILK 2-0 18XBRD TIE 12 (SUTURE) ×1 IMPLANT
SUT SILK 3 0 (SUTURE) ×3
SUT SILK 3 0 SH CR/8 (SUTURE) ×3 IMPLANT
SUT SILK 3-0 18XBRD TIE 12 (SUTURE) ×1 IMPLANT
SUT VIC AB 2-0 SH 18 (SUTURE) ×11 IMPLANT
SUT VIC AB 2-0 SH 27 (SUTURE) ×3
SUT VIC AB 2-0 SH 27X BRD (SUTURE) ×1 IMPLANT
SUT VIC AB 4-0 PS2 27 (SUTURE) ×3 IMPLANT
TOWEL OR NON WOVEN STRL DISP B (DISPOSABLE) ×3 IMPLANT
TRAY FOLEY CATH 14FRSI W/METER (CATHETERS) ×2 IMPLANT
TRAY FOLEY W/METER SILVER 16FR (SET/KITS/TRAYS/PACK) IMPLANT
TUBING CONNECTING 10 (TUBING) ×3 IMPLANT
TUBING CONNECTING 10' (TUBING) ×2

## 2017-06-18 NOTE — Interval H&P Note (Signed)
History and Physical Interval Note:  06/18/2017 7:16 AM  Sherry Baker  has presented today for surgery, with the diagnosis of Rectal Cancer  The various methods of treatment have been discussed with the patient and family. After consideration of risks, benefits and other options for treatment, the patient has consented to  Procedure(s): [place] (N/A) as a surgical intervention .  The patient's history has been reviewed, patient examined, no change in status, stable for surgery.  I have reviewed the patient's chart and labs.  Questions were answered to the patient's satisfaction.     Rosario Adie, MD  Colorectal and [place] [facility]

## 2017-06-18 NOTE — Anesthesia Procedure Notes (Signed)
Procedure Name: Intubation Date/Time: 06/18/2017 10:14 AM Performed by: Lind Covert Pre-anesthesia Checklist: Patient identified, Emergency Drugs available, Suction available, Patient being monitored and Timeout performed Patient Re-evaluated:Patient Re-evaluated prior to induction Oxygen Delivery Method: Circle system utilized Preoxygenation: Pre-oxygenation with 100% oxygen Induction Type: IV induction Ventilation: Mask ventilation without difficulty Laryngoscope Size: Mac and 3 Grade View: Grade I Tube type: Oral Tube size: 7.0 mm Number of attempts: 1 Airway Equipment and Method: Stylet Placement Confirmation: ETT inserted through vocal cords under direct vision,  positive ETCO2 and breath sounds checked- equal and bilateral Secured at: 21 cm Tube secured with: Tape Dental Injury: Teeth and Oropharynx as per pre-operative assessment

## 2017-06-18 NOTE — Transfer of Care (Signed)
Immediate Anesthesia Transfer of Care Note  Patient: Sherry Baker  Procedure(s) Performed: Procedure(s): ABDOMINAL PERINEAL RESECTION ERAS PATHWAY (N/A)  Patient Location: PACU  Anesthesia Type:General  Level of Consciousness: sedated  Airway & Oxygen Therapy: Patient Spontanous Breathing and Patient connected to face mask oxygen  Post-op Assessment: Report given to RN and Post -op Vital signs reviewed and stable  Post vital signs: Reviewed and stable  Last Vitals:  Vitals:   06/18/17 0721  BP: 124/77  Pulse: 99  Resp: 18  Temp: 36.6 C  SpO2: 100%    Last Pain:  Vitals:   06/18/17 0721  TempSrc: Oral      Patients Stated Pain Goal: 4 (86/14/83 0735)  Complications: No apparent anesthesia complications

## 2017-06-18 NOTE — H&P (View-Only) (Signed)
History of Present Illness Sherry Ruff MD; 3/55/7322 9:18 AM) The patient is a 51 year old female who presents with colorectal cancer. 51 year old female who presented to the hospital several weeks ago with constipation and rectal bleeding. She was found to have a distal rectal mass and underwent colonoscopy which confirmed a rectal adenocarcinoma. CT scan showed no signs of metastatic disease but a bulky distal rectal mass is present. MRI shows T3 tomor near sphincter complex. She reports minimal rectal bleeding with bowel movements but is having pain. She reports no history of constipation. She underwent a fractionated dose of ChemoRT.    Problem List/Past Medical Sherry Ruff, MD; 0/25/4270 9:15 AM) RECTAL CANCER (C20)  Past Surgical History Sherry Ruff, MD; 04/26/7627 9:15 AM) No pertinent past surgical history  Diagnostic Studies History Sherry Ruff, MD; 01/16/1760 9:15 AM) Colonoscopy never Mammogram never Pap Smear >5 years ago  Allergies Mammie Lorenzo, LPN; 04/09/3709 6:26 AM) Penicillins Hives, Swelling.  Medication History Sherry Ruff, MD; 9/48/5462 9:15 AM) Aspirin (325MG  Tablet, Oral as needed) Active. Medications Reconciled Lidocaine (5% Ointment, 1 application External three times daily, as needed, Taken starting 06/02/2017) Active.   Social History Sherry Ruff, MD; 05/05/5008 9:15 AM) Alcohol use Occasional alcohol use. Illicit drug use Uses socially only. No caffeine use Tobacco use Former smoker.  Family History Sherry Ruff, MD; 3/81/8299 9:15 AM) Colon Polyps Family Members In General, Mother. Migraine Headache Father.  Pregnancy / Birth History Sherry Ruff, MD; 3/71/[birth year]67 9:15 AM) Age at menarche 57 years. Age of menopause <47 Gravida 2 Irregular periods Maternal age 26-35 Para 2  Other Problems Sherry Ruff, MD; 8/93/8101 9:15 AM) Arthritis Back Pain Colon Cancer Depression Transfusion  history    Vitals Mammie Lorenzo LPN; 7/51/0258 5:27 AM) 06/02/2017 8:56 AM Weight: 115.8 lb Height: 62in Body Surface Area: 1.52 m Body Mass Index: 21.18 kg/m  Temp.: 75F(Oral)  Pulse: 94 (Regular)  BP: 122/84 (Sitting, Left Arm, Standard)      Physical Exam Sherry Ruff MD; 7/82/4235 9:19 AM)  General Mental Status-Alert. General Appearance-Not in acute distress. Build & Nutrition-Well nourished. Posture-Normal posture. Gait-Normal.  Head and Neck Head-normocephalic, atraumatic with no lesions or palpable masses. Trachea-midline.  Chest and Lung Exam Chest and lung exam reveals -on auscultation, normal breath sounds, no adventitious sounds and normal vocal resonance.  Cardiovascular Cardiovascular examination reveals -normal heart sounds, regular rate and rhythm with no murmurs and no digital clubbing, cyanosis, edema, increased warmth or tenderness.  Abdomen Inspection Inspection of the abdomen reveals - No Hernias. Palpation/Percussion Palpation and Percussion of the abdomen reveal - Soft, Non Tender, No Rigidity (guarding), No hepatosplenomegaly and No Palpable abdominal masses.  Rectal Anorectal Exam Internal - Note: Posterior rectal mass abutting the pelvic floor, non-mobile, extends up the left side, extends into the posterior anal canal.  Neurologic Neurologic evaluation reveals -alert and oriented x 3 with no impairment of recent or remote memory, normal attention span and ability to concentrate, normal sensation and normal coordination.  Musculoskeletal Normal Exam - Bilateral-Upper Extremity Strength Normal and Lower Extremity Strength Normal.    Assessment & Plan Sherry Ruff MD; 3/61/4431 9:21 AM)  RECTAL CANCER (C20) Impression: 51 year old female who presents to the office for evaluation of a large distal rectal cancer. She has underwent a fractionated course of chemotherapy and radiation which completed  at the end of June 2018. Since that time she has had worsening rectal pain. On exam she has a posterior midline anal fissure and rectal exam shows a large bulky  mass extending down into the posterior anal canal. I have recommended an abdominal perineal resection. We will try to get this scheduled within the next couple weeks. I have given her diltiazem ointment and lidocaine ointment for fissure. The surgery and anatomy were described to the patient as well as the risks of surgery and the possible complications. These include: Bleeding, deep abdominal infections and possible wound complications such as hernia and infection, damage to adjacent structures, leak of surgical connections, which can lead to other surgeries and possibly an ostomy, possible need for other procedures, such as abscess drains in radiology, possible prolonged hospital stay, possible diarrhea from removal of part of the colon, possible constipation from narcotics, possible bowel, bladder or sexual dysfunction if having rectal surgery, prolonged fatigue/weakness or appetite loss, possible early recurrence of of disease, possible complications of their medical problems such as heart disease or arrhythmias or lung problems, death (less than 1%). I believe the patient understands and wishes to proceed with the surgery.

## 2017-06-18 NOTE — Op Note (Signed)
06/18/2017  1:39 PM  PATIENT:  Sherry Baker  51 y.o. female  Patient Care Team: Arnoldo Morale, MD as PCP - General (Family Medicine)  PRE-OPERATIVE DIAGNOSIS:  Rectal Cancer  POST-OPERATIVE DIAGNOSIS:  Rectal Cancer  PROCEDURE:   ABDOMINAL PERINEAL RESECTION    Surgeon(s): Leighton Ruff, MD Michael Boston, MD  ASSISTANT: Dr Johney [place]   ANESTHESIA:   general  EBL: 471ml  Total I/O In: 3300 [I.V.:3300] Out: 700 [Urine:300; Blood:400]  DRAINS: (3F ) Jackson-Pratt drain(s) with closed bulb suction in the pelvis   SPECIMEN:  Source of Specimen:  rectosigmoid and anus  DISPOSITION OF SPECIMEN:  PATHOLOGY  COUNTS:  YES  PLAN OF CARE: Admit to inpatient   PATIENT DISPOSITION:  PACU - hemodynamically stable.  INDICATION: 51 y.o. with distal rectal cancer.   OR FINDINGS: significant left posterior adhesions and scar tissue noted  DESCRIPTION: the patient was identified in the preoperative holding area and taken to the OR where they were laid supine on the operating room table.  Gen. anesthesia was induced without difficulty. SCDs were also noted to be in place prior to the initiation of anesthesia.  The patient was then placed in lithotomy position and a Foley was inserted under sterile conditions. She was thenprepped and draped in the usual sterile fashion.   A surgical timeout was performed indicating the correct patient, procedure, positioning and need for preoperative antibiotics.   I began by making a lower midline incision using a 10 blade scalpel. This was carried down through subcutaneous tissue using electrocautery. The fascia was incised at midline. The peritoneum was entered bluntly and enlarged. I placed a [place] retractor into the wound and began mobilizing the sigmoid colon off of the left pelvic sidewall using electrocautery and blunt dissection. Once this was completed the sigmoid colon was transected using a GIA blue load stapler. The remaining mesentery was  divided using a LigaSure device and 2-0 silk sutures to close the hemorrhoidal artery. The posterior mesorectal plane was identified and dissected bluntly. The left ureter was also identified and kept out of our dissection plane. The peritoneal coverings were divided laterally using electrocautery. I was able to dissect down to the level of the mid rectum before I encountered dense adhesion and scar tissue mostly on the patient's left and posterior mesorectum regions. I began to carefully dissect this using a combination of electrocautery, bipolar cautery device and sharp dissection making sure to stay within the mesorectum plane. I dissected to the level of the coccyx posteriorly. I then elevated the uterus superiorly and divided the peritoneum anteriorly above the rectum. I entered into the rectovaginal plane and divided this bluntly. At this point Dr. Johney [place] came in to help me. I transitioned to the perineal portion of the case and he stayed above in the abdomen. I made an incision around the anal canal using light cautery. I dissected circumferentially through the subcutaneous tissues to the level of the spector's. I then dissected posteriorly to the level of the coccyx. I identified his dissection plane posteriorly and divided the anococcygeal ligament using sharp scissors. We then continued to enlarge this plane out laterally using a combination of electrocautery and sharp dissection. I then brought the sigmoid colon through the posterior defect and continued to develop the anterior plane. I divided the remaining anterior lateral attachments using electrocautery. I then carefully separated out the rectum from the vagina using blunt dissection and electrocautery. Once this was freed the specimen was removed from the abdomen completely. The defect  was then closed in layers using 2-0 Vicryl interrupted suture. A dressing was applied. I switched to clean gowns and gloves and returned to the pelvic cavity. I had  anesthesia inject methylene blue to check for any ureter injuries as there was quite a bit of blind dissection in the left posterior lateral plane. Once the methylene blue reach the ureters, I placed a sponge into the pelvis.  There was no leak detected. After this the peritoneum was closed over a 19 [place] drain using a running 2-0 Vicryl suture. I then made a defect in the previously marked ostomy site using a 10 blade scalpel. I dissected down to the subcutaneous tissue using electrocautery. A cruciate incision was made in the fascia. The rectus muscle was split and the peritoneum was entered bluntly. The remaining sigmoid colon was brought out through this defect. The retractor and sponges were removed from the abdomen. The omentum was brought down over the abdominal contents and the fascia was closed using 2 #1 PDS running sutures. The subcutaneous tissue was reapproximated using interrupted 2-0 Vicryl suture. The skin was closed with staples. The ostomy was then matured in standard [facility] fashion using 2-0 Vicryl sutures. An ostomy appliance was placed. A sterile dressing was placed over the midline wound. The patient was awakened from anesthesia and sent to the postanesthesia care unit in stable condition. All counts were correct per operating room staff.

## 2017-06-18 NOTE — Anesthesia Postprocedure Evaluation (Signed)
Anesthesia Post Note  Patient: Sherry Baker  Procedure(s) Performed: Procedure(s) (LRB): ABDOMINAL PERINEAL RESECTION ERAS PATHWAY (N/A)     Patient location during evaluation: PACU Anesthesia Type: General Level of consciousness: sedated and patient cooperative Pain management: pain level controlled Vital Signs Assessment: post-procedure vital signs reviewed and stable Respiratory status: spontaneous breathing Cardiovascular status: stable Anesthetic complications: no    Last Vitals:  Vitals:   06/18/17 1457 06/18/17 1500  BP: (!) 187/94   Pulse:    Resp:    Temp:  (P) 36.8 C  SpO2:      Last Pain:  Vitals:   06/18/17 1451  TempSrc:   PainSc: [place]

## 2017-06-18 NOTE — Anesthesia Preprocedure Evaluation (Signed)
Anesthesia Evaluation  Patient identified by MRN, date of birth, ID band Patient awake    Reviewed: Allergy & Precautions, NPO status , Patient's Chart, lab work & pertinent test results  Airway Mallampati: I  TM Distance: >3 FB Neck ROM: Full    Dental no notable dental hx.    Pulmonary Current Smoker,    Pulmonary exam normal breath sounds clear to auscultation       Cardiovascular hypertension, Pt. on medications Normal cardiovascular exam Rhythm:Regular Rate:Normal     Neuro/Psych PSYCHIATRIC DISORDERS Depression Bipolar Disorder negative neurological ROS     GI/Hepatic Bowel prep,GERD  ,(+)     substance abuse  alcohol use and cocaine use,   Endo/Other  negative endocrine ROS  Renal/GU negative Renal ROS     Musculoskeletal  (+) Arthritis ,   Abdominal Normal abdominal exam  (+)   Peds  Hematology  (+) anemia ,   Anesthesia Other Findings   Reproductive/Obstetrics                             Anesthesia Physical Anesthesia Plan  ASA: II  Anesthesia Plan: General   Post-op Pain Management:    Induction: Intravenous  PONV Risk Score and Plan: 3 and Ondansetron, Dexamethasone, Midazolam and Metaclopromide  Airway Management Planned: Oral ETT  Additional Equipment:   Intra-op Plan:   Post-operative Plan: Extubation in OR  Informed Consent: I have reviewed the patients History and Physical, chart, labs and discussed the procedure including the risks, benefits and alternatives for the proposed anesthesia with the patient or authorized representative who has indicated his/her understanding and acceptance.   Dental advisory given  Plan Discussed with: CRNA  Anesthesia Plan Comments:         Anesthesia Quick Evaluation

## 2017-06-19 LAB — BASIC METABOLIC PANEL
Anion gap: 8 (ref 5–15)
BUN: 14 mg/dL (ref 6–20)
CO2: 25 mmol/L (ref 22–32)
Calcium: 7.2 mg/dL — ABNORMAL LOW (ref 8.9–10.3)
Chloride: 96 mmol/L — ABNORMAL LOW (ref 101–111)
Creatinine, Ser: 0.93 mg/dL (ref 0.44–1.00)
GFR calc Af Amer: >60 mL/min (ref >60)
GLUCOSE: 115 mg/dL — AB (ref 65–99)
POTASSIUM: 4.1 mmol/L (ref 3.5–5.1)
SODIUM: 129 mmol/L — AB (ref 135–145)

## 2017-06-19 LAB — CBC
HEMATOCRIT: 28.4 % — AB (ref 36.0–46.0)
HEMOGLOBIN: 9.4 g/dL — AB (ref 12.0–15.0)
MCH: 30.1 pg (ref 26.0–34.0)
MCHC: 33.1 g/dL (ref 30.0–36.0)
MCV: 91 fL (ref 78.0–100.0)
Platelets: 136 10*3/uL — ABNORMAL LOW (ref 150–400)
RBC: 3.12 MIL/uL — ABNORMAL LOW (ref 3.87–5.11)
RDW: 16 % — ABNORMAL HIGH (ref 11.5–15.5)
WBC: 10.4 10*3/uL (ref 4.0–10.5)

## 2017-06-19 MED ORDER — SODIUM CHLORIDE 0.9 % IV SOLN
INTRAVENOUS | Status: DC
  Administered 2017-06-19: 75 mL/h via INTRAVENOUS
  Administered 2017-06-19 – 2017-06-20 (×2): via INTRAVENOUS

## 2017-06-19 NOTE — Progress Notes (Signed)
1 Day Post-Op APR Subjective: No complaints overnight.  Min nausea  Objective: Vital signs in last 24 hours: Temp:  [97.5 F (36.4 C)-99 F (37.2 C)] 97.5 F (36.4 C) (08/17 0635) Pulse Rate:  [69-91] 83 (08/17 0635) Resp:  [11-18] 15 (08/17 0635) BP: (94-187)/(66-94) 94/68 (08/17 0635) SpO2:  [96 %-100 %] 98 % (08/17 0635)   Intake/Output from previous day: 08/16 0701 - 08/17 0700 In: 5368.8 [P.O.:860; I.V.:4398.8; IV Piggyback:50] Out: 1330 [Urine:820; Drains:110; Blood:400] Intake/Output this shift: No intake/output data recorded.   General appearance: alert and cooperative GI: normal findings: soft, moderately distended JP: serosanguinous drainage Ostomy: pink, viable Incision: no significant drainage  Lab Results:   Recent Labs  06/18/17 1654 06/19/17 0533  WBC 10.5 10.4  HGB 11.4* 9.4*  HCT 33.9* 28.4*  PLT 143* 136*   BMET  Recent Labs  06/18/17 1654 06/19/17 0533  NA  --  129*  K  --  4.1  CL  --  96*  CO2  --  25  GLUCOSE  --  115*  BUN  --  14  CREATININE 0.52 0.93  CALCIUM  --  7.2*   PT/INR No results for input(s): LABPROT, INR in the last 72 hours. ABG No results for input(s): PHART, HCO3 in the last 72 hours.  Invalid input(s): PCO2, PO2  MEDS, Scheduled . acetaminophen  1,000 mg Oral Q6H  . alvimopan  12 mg Oral BID  . carvedilol  6.25 mg Oral BID WC  . celecoxib  200 mg Oral BID  . enoxaparin (LOVENOX) injection  40 mg Subcutaneous Q24H  . gabapentin  300 mg Oral BID  . morphine   Intravenous Q4H    Studies/Results: No results found.  Assessment: s/p Procedure(s): ABDOMINAL PERINEAL RESECTION ERAS PATHWAY Patient Active Problem List   Diagnosis Date Noted  . Genetic testing 03/24/2017  . Hypertension 02/18/2017  . GERD (gastroesophageal reflux disease) 02/18/2017  . Rectal cancer ([place]) 01/27/2017  . Abdominal pain, epigastric   . Rectal mass   . Fever   . Anemia 01/16/2017  . Abdominal pain   . Constipation   .  Alcohol abuse   . Tobacco abuse   . Cocaine abuse   . Bipolar affective disorder ([place])   . Protein-calorie malnutrition, severe ([place]) 02/13/2014  . Suicide ideation 02/12/2014  . Suicidal ideation 02/11/2014  . Alcohol intoxication ([place]) 02/11/2014  . Transaminitis 02/11/2014  . Substance abuse 02/11/2014  . Depression 02/11/2014  . Thrombocytopenia ([place]) 02/11/2014    Expected post op course  Plan: Cont clears today  Ambulate in hall D/c foley in AM Cont PCA  Hyponatremia: will switch to 0.9NS and recheck in AM Cocaine addiction: offered to place to rehab after hospital stay but pt refused    LOS: 1 day     .Rosario Adie, [facility], [place] [phone]   06/19/2017 7:45 AM

## 2017-06-19 NOTE — Consult Note (Addendum)
[place] Nurse ostomy consult note Initial consult.  Patient has LLQ Colostomy Stoma type/location: 1 3/4" edematous, pale pink and moist.  No stool in pouch at this time.  Os is at center but points downward. Peristomal assessment: Intact.  Will add barrier ring and 1 piece pouch.  Treatment options for stomal/peristomal skin: barrier ring Output none yet Ostomy pouching: 1pc.flat with barrier ring Education provided: Extended session for pouch change and initial ostomy assessment.  Basic GI covered, also pouch and stoma characteristics.  Pouch preparation demonstrated, pouch applied with patient observing and asking questions. Paitent taught lock and roll closure and how to clean bottom of pouch (simulated).  Continuation of education will continue over the next few days. Supplies in room for next pouch change. Enrolled patient in [place] program: No WOC team will follow and remain available to patient, medical and nursing teams.  Domenic Moras RN BSN [place] Pager [phone]

## 2017-06-19 NOTE — Evaluation (Signed)
Physical Therapy Evaluation Patient Details Name: Sherry Baker MRN: [id number] DOB: Mar 18, 1966 Today's Date: 06/19/2017   History of Present Illness  The patient is a 51 year old female who presents with colorectal cancer. 51 year old female who presented to the hospital several weeks ago with constipation and rectal bleeding.  She was found to have a distal rectal mass and underwent colonoscopy which confirmed a rectal adenocarcinoma; s/p ABDOMINAL PERINEAL RESECTION     Clinical Impression  Pt admitted with above diagnosis. Pt currently with functional limitations due to the deficits listed below (see PT Problem List).  Pt will benefit from skilled PT to increase their independence and safety with mobility to allow discharge to the venue listed below.   Pt motivated to be OOB, amb  33' with RW and min assist; if progresses well should be able to to D/C home with HHPT     Follow Up Recommendations Home health PT;Supervision - Intermittent (if progresses well)    Equipment Recommendations  Rolling walker with 5" wheels    Recommendations for Other Services       Precautions / Restrictions Precautions Precautions: Fall Precaution Comments: JP drain      Mobility  Bed Mobility Overal bed mobility: Needs Assistance Bed Mobility: Rolling;Sidelying to Sit Rolling: Min assist Sidelying to sit: Min assist;Mod assist       General bed mobility comments: assist with LEs and trunk to upright, cues for rolling/technique to diminish pain, incr time  Transfers Overall transfer level: Needs assistance Equipment used: Rolling walker (2 wheeled) Transfers: Sit to/from Stand Sit to Stand: Min assist;Mod assist         General transfer comment: assist to rise and stabilize, verbal cues for hand placement, pt very shaky upon standing  Ambulation/Gait Ambulation/Gait assistance: Min assist;Min guard Ambulation Distance (Feet): 80 Feet Assistive device: Rolling walker (2 wheeled) Gait  Pattern/deviations: Step-through pattern;Decreased stride length Gait velocity: O2 sats 96-100% on RA   General Gait Details: intermittent assist to maneuver RW, cues for trunk extension and RW position  Stairs            Wheelchair Mobility    Modified Rankin (Stroke Patients Only)       Balance Overall balance assessment: Needs assistance   Sitting balance-Leahy Scale: Fair       Standing balance-Leahy Scale: Poor Standing balance comment: reliant on UEs                             Pertinent Vitals/Pain Pain Assessment: Faces Faces Pain Scale: Hurts little more Pain Location: abd Pain Descriptors / Indicators: Sore Pain Intervention(s): Limited activity within patient's tolerance;Monitored during session;Repositioned;Other (comment) (PCA)    Home Living Family/patient expects to be discharged to:: Private residence Living Arrangements: Alone Available Help at Discharge: Friend(s);Neighbor   Home Access: Stairs to enter   Technical brewer of Steps: 1 Home Layout: One level Home Equipment: None      Prior Function Level of Independence: Independent               Hand Dominance        Extremity/Trunk Assessment   Upper Extremity Assessment Upper Extremity Assessment: Overall WFL for tasks assessed    Lower Extremity Assessment Lower Extremity Assessment: Generalized weakness       Communication   Communication: No difficulties  Cognition Arousal/Alertness: Awake/alert Behavior During Therapy: WFL for tasks assessed/performed Overall Cognitive Status: Within Functional Limits for tasks assessed  General Comments General comments (skin integrity, edema, etc.): pt educated in IS and use of pillow  to abd if coughing    Exercises     Assessment/Plan    PT Assessment Patient needs continued PT services  PT Problem List Decreased strength;Decreased  mobility;Decreased knowledge of use of DME;Decreased activity tolerance;Pain       PT Treatment Interventions DME instruction;Gait training;Functional mobility training;Therapeutic activities;Therapeutic exercise;Patient/family education    PT Goals (Current goals can be found in the Care Plan section)  Acute Rehab PT Goals Patient Stated Goal: get strength back PT Goal Formulation: With patient Time For Goal Achievement: 07/03/17 Potential to Achieve Goals: Good    Frequency Min 3X/week   Barriers to discharge        Co-evaluation               AM-PAC PT "6 Clicks" Daily Activity  Outcome Measure Difficulty turning over in bed (including adjusting bedclothes, sheets and blankets)?: Unable Difficulty moving from lying on back to sitting on the side of the bed? : Unable Difficulty sitting down on and standing up from a chair with arms (e.g., wheelchair, bedside commode, etc,.)?: Unable Help needed moving to and from a bed to chair (including a wheelchair)?: A Little Help needed walking in hospital room?: A Little Help needed climbing 3-5 steps with a railing? : A Little 6 Click Score: 12    End of Session Equipment Utilized During Treatment: Gait belt Activity Tolerance: Patient tolerated treatment well Patient left: in chair;with call bell/phone within reach Nurse Communication: Mobility status PT Visit Diagnosis: Difficulty in walking, not elsewhere classified (R26.2)    Time: 0315-9458 PT Time Calculation (min) (ACUTE ONLY): 26 min   Charges:   PT Evaluation $PT Eval Low Complexity: 1 Low PT Treatments $Gait Training: 8-22 mins   PT G CodesKenyon Ana, PT Pager: [phone] 06/19/2017   Kenyon Ana 06/19/2017, 1:26 PM

## 2017-06-20 LAB — BASIC METABOLIC PANEL
ANION GAP: 5 (ref 5–15)
BUN: 10 mg/dL (ref 6–20)
CHLORIDE: 99 mmol/L — AB (ref 101–111)
CO2: 24 mmol/L (ref 22–32)
Calcium: 7.3 mg/dL — ABNORMAL LOW (ref 8.9–10.3)
Creatinine, Ser: 0.49 mg/dL (ref 0.44–1.00)
GFR calc Af Amer: >60 mL/min (ref >60)
GLUCOSE: 99 mg/dL (ref 65–99)
POTASSIUM: 4.1 mmol/L (ref 3.5–5.1)
Sodium: 128 mmol/L — ABNORMAL LOW (ref 135–145)

## 2017-06-20 LAB — CBC
HEMATOCRIT: 26.3 % — AB (ref 36.0–46.0)
HEMOGLOBIN: 9 g/dL — AB (ref 12.0–15.0)
MCH: 31.3 pg (ref 26.0–34.0)
MCHC: 34.2 g/dL (ref 30.0–36.0)
MCV: 91.3 fL (ref 78.0–100.0)
Platelets: 108 10*3/uL — ABNORMAL LOW (ref 150–400)
RBC: 2.88 MIL/uL — ABNORMAL LOW (ref 3.87–5.11)
RDW: 15.6 % — ABNORMAL HIGH (ref 11.5–15.5)
WBC: 6.9 10*3/uL (ref 4.0–10.5)

## 2017-06-20 NOTE — Progress Notes (Signed)
2 Days Post-Op APR Subjective: No complaints overnight.  Min nausea  Objective: Vital signs in last 24 hours: Temp:  [97.8 F (36.6 C)-98.2 F (36.8 C)] 97.8 F (36.6 C) (08/18 0611) Pulse Rate:  [77-96] 80 (08/18 0611) Resp:  [12-16] 13 (08/18 0800) BP: (97-123)/(67-84) 109/76 (08/18 0611) SpO2:  [94 %-100 %] 99 % (08/18 0800)   Intake/Output from previous day: 08/17 0701 - 08/18 0700 In: 2582.5 [P.O.:960; I.V.:1622.5] Out: 3215 [Urine:3050; Drains:165] Intake/Output this shift: Total I/O In: 240 [P.O.:240] Out: 500 [Urine:500]   General appearance: alert and cooperative GI: normal findings: soft, non distended.  Gas in bag.   JP: serosanguinous drainage Ostomy: pink, viable Incision: no significant drainage  Lab Results:   Recent Labs  06/19/17 0533 06/20/17 0533  WBC 10.4 6.9  HGB 9.4* 9.0*  HCT 28.4* 26.3*  PLT 136* 108*   BMET  Recent Labs  06/19/17 0533 06/20/17 0533  NA 129* 128*  K 4.1 4.1  CL 96* 99*  CO2 25 24  GLUCOSE 115* 99  BUN 14 10  CREATININE 0.93 0.49  CALCIUM 7.2* 7.3*   PT/INR No results for input(s): LABPROT, INR in the last 72 hours. ABG No results for input(s): PHART, HCO3 in the last 72 hours.  Invalid input(s): PCO2, PO2  MEDS, Scheduled . acetaminophen  1,000 mg Oral Q6H  . alvimopan  12 mg Oral BID  . carvedilol  6.25 mg Oral BID WC  . celecoxib  200 mg Oral BID  . enoxaparin (LOVENOX) injection  40 mg Subcutaneous Q24H  . gabapentin  300 mg Oral BID  . morphine   Intravenous Q4H    Studies/Results: No results found.  Assessment: s/p Procedure(s): ABDOMINAL PERINEAL RESECTION ERAS PATHWAY Patient Active Problem List   Diagnosis Date Noted  . Genetic testing 03/24/2017  . Hypertension 02/18/2017  . GERD (gastroesophageal reflux disease) 02/18/2017  . Rectal cancer ([place]) 01/27/2017  . Abdominal pain, epigastric   . Rectal mass   . Fever   . Anemia 01/16/2017  . Abdominal pain   . Constipation   .  Alcohol abuse   . Tobacco abuse   . Cocaine abuse   . Bipolar affective disorder ([place])   . Protein-calorie malnutrition, severe ([place]) 02/13/2014  . Suicide ideation 02/12/2014  . Suicidal ideation 02/11/2014  . Alcohol intoxication ([place]) 02/11/2014  . Transaminitis 02/11/2014  . Substance abuse 02/11/2014  . Depression 02/11/2014  . Thrombocytopenia ([place]) 02/11/2014    Expected post op course  Plan: Recurrent rectal cancer, s/p APR  Ambulate in hall D/c foley Cont PCA Advance diet. Hyponatremia: will switch to 0.9NS and recheck in AM Cocaine addiction: offered to place to rehab after hospital stay but pt refused    LOS: 2 days     [facility], [place]   06/20/2017 11:11 AM

## 2017-06-21 LAB — BASIC METABOLIC PANEL
ANION GAP: 5 (ref 5–15)
BUN: 6 mg/dL (ref 6–20)
CHLORIDE: 103 mmol/L (ref 101–111)
CO2: 25 mmol/L (ref 22–32)
CREATININE: 0.41 mg/dL — AB (ref 0.44–1.00)
Calcium: 7.7 mg/dL — ABNORMAL LOW (ref 8.9–10.3)
GFR calc non Af Amer: >60 mL/min (ref >60)
GLUCOSE: 107 mg/dL — AB (ref 65–99)
Potassium: 3.8 mmol/L (ref 3.5–5.1)
Sodium: 133 mmol/L — ABNORMAL LOW (ref 135–145)

## 2017-06-21 LAB — CBC
HCT: 23.6 % — ABNORMAL LOW (ref 36.0–46.0)
HEMOGLOBIN: 8.1 g/dL — AB (ref 12.0–15.0)
MCH: 31.8 pg (ref 26.0–34.0)
MCHC: 34.3 g/dL (ref 30.0–36.0)
MCV: 92.5 fL (ref 78.0–100.0)
Platelets: 108 10*3/uL — ABNORMAL LOW (ref 150–400)
RBC: 2.55 MIL/uL — AB (ref 3.87–5.11)
RDW: 15.4 % (ref 11.5–15.5)
WBC: 5.6 10*3/uL (ref 4.0–10.5)

## 2017-06-21 MED ORDER — OXYCODONE HCL 5 MG PO TABS
5.0000 mg | ORAL_TABLET | ORAL | Status: DC | PRN
  Administered 2017-06-21 – 2017-06-22 (×7): 10 mg via ORAL
  Filled 2017-06-21 (×7): qty 2

## 2017-06-21 NOTE — Progress Notes (Signed)
Pt pulled out IV by accident.  IV fluids and PCA pump have been discontinued.  Will leave IV out for now per pt.

## 2017-06-21 NOTE — Progress Notes (Signed)
3 Days Post-Op APR Subjective: Doing well other than beeping of PCA.  Tolerated regular diet.  No stool recorded yesterday.    Objective: Vital signs in last 24 hours: Temp:  [97.6 F (36.4 C)-98.6 F (37 C)] 98.4 F (36.9 C) (08/19 0557) Pulse Rate:  [69-89] 85 (08/19 0557) Resp:  [10-15] 13 (08/19 0800) BP: (100-123)/(68-87) 123/87 (08/19 0557) SpO2:  [99 %-100 %] 100 % (08/19 0800)   Intake/Output from previous day: 08/18 0701 - 08/19 0700 In: 2280 [P.O.:480; I.V.:1800] Out: 4350 [Urine:4300; Drains:50] Intake/Output this shift: Total I/O In: -  Out: 400 [Urine:400]   General appearance: alert and cooperative GI: normal findings: soft, non distended.  Gas in bag. Small amount of stool.     JP: serosanguinous drainage Ostomy: pink, viable Incision: no significant drainage  Lab Results:   Recent Labs  06/20/17 0533 06/21/17 0509  WBC 6.9 5.6  HGB 9.0* 8.1*  HCT 26.3* 23.6*  PLT 108* 108*   BMET  Recent Labs  06/20/17 0533 06/21/17 0509  NA 128* 133*  K 4.1 3.8  CL 99* 103  CO2 24 25  GLUCOSE 99 107*  BUN 10 6  CREATININE 0.49 0.41*  CALCIUM 7.3* 7.7*   PT/INR No results for input(s): LABPROT, INR in the last 72 hours. ABG No results for input(s): PHART, HCO3 in the last 72 hours.  Invalid input(s): PCO2, PO2  MEDS, Scheduled . acetaminophen  1,000 mg Oral Q6H  . alvimopan  12 mg Oral BID  . carvedilol  6.25 mg Oral BID WC  . celecoxib  200 mg Oral BID  . enoxaparin (LOVENOX) injection  40 mg Subcutaneous Q24H  . gabapentin  300 mg Oral BID    Studies/Results: No results found.  Assessment: s/p Procedure(s): ABDOMINAL PERINEAL RESECTION ERAS PATHWAY Patient Active Problem List   Diagnosis Date Noted  . Genetic testing 03/24/2017  . Hypertension 02/18/2017  . GERD (gastroesophageal reflux disease) 02/18/2017  . Rectal cancer ([place]) 01/27/2017  . Abdominal pain, epigastric   . Rectal mass   . Fever   . Anemia 01/16/2017  .  Abdominal pain   . Constipation   . Alcohol abuse   . Tobacco abuse   . Cocaine abuse   . Bipolar affective disorder ([place])   . Protein-calorie malnutrition, severe ([place]) 02/13/2014  . Suicide ideation 02/12/2014  . Suicidal ideation 02/11/2014  . Alcohol intoxication ([place]) 02/11/2014  . Transaminitis 02/11/2014  . Substance abuse 02/11/2014  . Depression 02/11/2014  . Thrombocytopenia ([place]) 02/11/2014    Expected post op course  Plan: Recurrent rectal cancer, s/p APR  Ambulate in hall D/c PCA I reviewed IS use. Starting to have stool. Probably home tomorrow pending pain control. Cocaine addiction: offered to place to rehab after hospital stay but pt refused    LOS: 3 days     [facility], [place]   06/21/2017 9:26 AM

## 2017-06-22 LAB — URINALYSIS, ROUTINE W REFLEX MICROSCOPIC
BILIRUBIN URINE: NEGATIVE
Glucose, UA: NEGATIVE mg/dL
Ketones, ur: NEGATIVE mg/dL
NITRITE: NEGATIVE
PROTEIN: 30 mg/dL — AB
SPECIFIC GRAVITY, URINE: 1.006 (ref 1.005–1.030)
Squamous Epithelial / LPF: NONE SEEN
pH: 7 (ref 5.0–8.0)

## 2017-06-22 LAB — CBC
HCT: 25.4 % — ABNORMAL LOW (ref 36.0–46.0)
Hemoglobin: 8.4 g/dL — ABNORMAL LOW (ref 12.0–15.0)
MCH: 30 pg (ref 26.0–34.0)
MCHC: 33.1 g/dL (ref 30.0–36.0)
MCV: 90.7 fL (ref 78.0–100.0)
PLATELETS: 139 10*3/uL — AB (ref 150–400)
RBC: 2.8 MIL/uL — AB (ref 3.87–5.11)
RDW: 15.5 % (ref 11.5–15.5)
WBC: 4.7 10*3/uL (ref 4.0–10.5)

## 2017-06-22 MED ORDER — OXYCODONE HCL 5 MG PO TABS: 5.0000 mg | ORAL_TABLET | Freq: Four times a day (QID) | ORAL | 0 refills | Status: DC | PRN

## 2017-06-22 MED FILL — oxyCODONE HCL 5 MG TABS: 5 | 3 days supply | Qty: 20 | Fill #0

## 2017-06-22 NOTE — Discharge Summary (Signed)
Physician Discharge Summary  Patient ID: Sherry Baker MRN: [id number] DOB/AGE: Aug 05, 1966 51 y.o.  Admit date: 06/18/2017 Discharge date: 06/22/2017  Admission Diagnoses: Rectal cancer  Discharge Diagnoses:  Active Problems:   Rectal cancer [facility])   Discharged Condition: good  Hospital Course: Pt admitted after surgery.  Her diet was advanced as tolerated.  Her foley was removed by POD 2.  By POD 4 she was in stable condition for discharge to home.    Consults: ostomy RN  Significant Diagnostic Studies: labs: cbc, chemistry  Treatments: IV hydration, analgesia: oxycodone and surgery: APR  Discharge Exam: Blood pressure (!) 168/99, pulse 91, temperature 98.1 F (36.7 C), temperature source Oral, resp. rate 18, height 5\' 3"  (1.6 m), weight 51.3 kg (113 lb), SpO2 100 %. General appearance: alert and cooperative GI: normal findings: soft, non-tender Incision/Wound: clean, dry Perineal wound with some suture breakdown but no signs of infection.  Disposition: 01-Home or Self Care   Allergies as of 06/22/2017      Reactions   Penicillins Hives, Swelling   Has patient had a PCN reaction causing immediate rash, facial/tongue/throat swelling, SOB or lightheadedness with hypotension: Yes Has patient had a PCN reaction causing severe rash involving mucus membranes or skin necrosis: No Has patient had a PCN reaction that required hospitalization No Has patient had a PCN reaction occurring within the last 10 years: Yes If all of the above answers are "NO", then may proceed with Cephalosporin use.      Medication List    STOP taking these medications   metroNIDAZOLE 500 MG tablet Commonly known as:  FLAGYL   neomycin 500 MG tablet Commonly known as:  MYCIFRADIN   oxyCODONE-acetaminophen 5-325 MG tablet Commonly known as:  PERCOCET   XELODA 500 MG tablet Generic drug:  capecitabine     TAKE these medications   albuterol (2.5 MG/3ML) 0.083% nebulizer solution Commonly known  as:  PROVENTIL Take 3 mLs (2.5 mg total) by nebulization every 6 (six) hours as needed for wheezing or shortness of breath.   carvedilol 6.25 MG tablet Commonly known as:  COREG Take 1 tablet (6.25 mg total) by mouth 2 (two) times daily with a meal.   ferrous sulfate 325 (65 FE) MG tablet Commonly known as:  QC FERROUS SULFATE Take 1 tablet (325 mg total) by mouth 2 (two) times daily with a meal.   FLUoxetine 20 MG tablet Commonly known as:  PROZAC Take 1 tablet (20 mg total) by mouth daily.   oxyCODONE 5 MG immediate release tablet Commonly known as:  Oxy IR/ROXICODONE Take 1-2 tablets (5-10 mg total) by mouth every 6 (six) hours as needed for severe pain or breakthrough pain.   pantoprazole 40 MG tablet Commonly known as:  PROTONIX Take 1 tablet (40 mg total) by mouth daily.   prochlorperazine 10 MG tablet Commonly known as:  COMPAZINE Take 1 tablet (10 mg total) by mouth every 6 (six) hours as needed for nausea or vomiting.      Follow-up Information    Leighton Ruff, MD. Schedule an appointment as soon as possible for a visit in 2 week(s).   Specialty:  General Surgery Contact information: [address] [place] [address] [phone]        [facility], [place]. Go on 06/29/2017.   Specialty:  General Surgery Why:  Come to the office at 10 am for a nurse visit to get staples removed.   Contact information: [place]  [place] [phone]          Signed: Rosario Adie 3/60/1658, 0:06 AM

## 2017-06-22 NOTE — Progress Notes (Signed)
Pt had blood in her urine x 2. Notified provider on call and obtained order for CBC, Urinalylsis.  Provider also told me to hold her Lovenox this am.

## 2017-06-22 NOTE — Progress Notes (Signed)
Discharge and medication instructions reviewed with patient. Questions answered. Patient denies further questions. One prescription given to patient. Friend is here to drive patient home.  Donne Hazel, RN

## 2017-06-22 NOTE — Progress Notes (Signed)
Pt having loose stools ostomy so I d/c Entereg.

## 2017-06-22 NOTE — Progress Notes (Signed)
Physical Therapy Treatment Patient Details Name: Sherry Baker MRN: [id number] DOB: 03-31-1966 Today's Date: 06/22/2017    History of Present Illness The patient is a 51 year old female who presents with colorectal cancer. 51 year old female who presented to the hospital several weeks ago with constipation and rectal bleeding.  She was found to have a distal rectal mass and underwent colonoscopy which confirmed a rectal adenocarcinoma; s/p ABDOMINAL PERINEAL RESECTION       PT Comments    Assisted with amb a great distance without need for any AD.    Follow Up Recommendations  Home health PT;Supervision - Intermittent     Equipment Recommendations       Recommendations for Other Services       Precautions / Restrictions Precautions Precautions: Fall Restrictions Weight Bearing Restrictions: No    Mobility  Bed Mobility Overal bed mobility: Modified Independent                Transfers Overall transfer level: Modified independent               General transfer comment: good use of hands to steady self  Ambulation/Gait Ambulation/Gait assistance: Supervision Ambulation Distance (Feet): 350 Feet Assistive device: None Gait Pattern/deviations: Step-through pattern Gait velocity: WFL   General Gait Details: good alternating gait with lateral sway (baseline)   Tolerated an increased distance and no need for any AD.  Did reccomend a cane for uneven, outdoor terrain.    Stairs            Wheelchair Mobility    Modified Rankin (Stroke Patients Only)       Balance                                            Cognition Arousal/Alertness: Awake/alert Behavior During Therapy: WFL for tasks assessed/performed Overall Cognitive Status: Within Functional Limits for tasks assessed                                        Exercises      General Comments        Pertinent Vitals/Pain Pain Assessment: No/denies pain     Home Living                      Prior Function            PT Goals (current goals can now be found in the care plan section) Progress towards PT goals: Progressing toward goals    Frequency    Min 3X/week      PT Plan Current plan remains appropriate    Co-evaluation              AM-PAC PT "6 Clicks" Daily Activity  Outcome Measure  Difficulty turning over in bed (including adjusting bedclothes, sheets and blankets)?: A Little Difficulty moving from lying on back to sitting on the side of the bed? : A Little Difficulty sitting down on and standing up from a chair with arms (e.g., wheelchair, bedside commode, etc,.)?: A Little Help needed moving to and from a bed to chair (including a wheelchair)?: A Little Help needed walking in hospital room?: A Little Help needed climbing 3-5 steps with a railing? : A Little 6 Click Score: 18  End of Session Equipment Utilized During Treatment: Gait belt Activity Tolerance: Patient tolerated treatment well Patient left: in bed;with family/visitor present;with call bell/phone within reach Nurse Communication: Mobility status PT Visit Diagnosis: Difficulty in walking, not elsewhere classified (R26.2)     Time: 8003-4917 PT Time Calculation (min) (ACUTE ONLY): 12 min  Charges:  $Gait Training: 8-22 mins                    G Codes:       {Averyanna Sax  PTA [facility]  Acute  Rehab Pager      [phone]

## 2017-06-22 NOTE — Progress Notes (Signed)
Spoke with patient at bedside. Patient states she lives with friends who support her, she has applied for Medicaid but has not been approved. Discussed need for [facility] nurse to assist with ostomy care, patient independent with emptying. Patient agreed to [facility], contacted them for referral. Patient can be reached at [phone]. Patient will be charity vs Medicaid pending dependent on application process. No further HH concerns, plans for d/c today, friend to provide transportation home.

## 2017-06-22 NOTE — Discharge Instructions (Signed)

## 2017-06-22 NOTE — Consult Note (Signed)
[facility] Nurse ostomy follow up Stoma type/location: LLQ Colostomy Stomal assessment/size: 1 and 5/8 inches, slightly oval, red, moist Peristomal assessment: intact, clear Treatment options for stomal/peristomal skin: skin barrier ring, flat pouch Output: soft brown stool  Ostomy pouching: 1pc.flat pouch and skin barrier ring.  Education provided: Extended session for patient and her friend to demonstrate pouch change and review pouch emptying. Patient had not practiced pouch emptying since she was taught by my partner on Friday. Observes pouch preparation after measurement of ostomy, placement of ring and placement of pouch. 9 pouches and 9 rings sent home with patient. Enrolled patient in [facility] Start Discharge program: Yes [place] nursing team will not follow as patient is being discharged today, but will remain available to this patient, the nursing and medical teams.  Please re-consult if needed. Thanks, Maudie Flakes, MSN, RN, [place], Arther Abbott  Pager# [phone]

## 2017-06-27 NOTE — Progress Notes (Signed)
  Radiation Oncology         [phone] ________________________________  Name: Sherry Baker MRN: [id number]  Date: 04/03/2017  DOB: 1966/03/03  Optical Surface Tracking Plan:  Since intensity modulated radiotherapy (IMRT) and 3D conformal radiation treatment methods are predicated on accurate and precise positioning for treatment, intrafraction motion monitoring is medically necessary to ensure accurate and safe treatment delivery.  The ability to quantify intrafraction motion without excessive ionizing radiation dose can only be performed with optical surface tracking. Accordingly, surface imaging offers the opportunity to obtain 3D measurements of patient position throughout IMRT and 3D treatments without excessive radiation exposure.  I am ordering optical surface tracking for this patient's upcoming course of radiotherapy. ________________________________  Kyung Rudd, MD 06/27/2017 2:40 PM    Reference:   Particia Jasper, et al. Surface imaging-based analysis of intrafraction motion for breast radiotherapy patients.Journal of [place], n. 6, nov. 2014. ISSN [id number].   Available at: <http://www.jacmp.org/index.php/jacmp/article/view/4957>.

## 2017-06-27 NOTE — Progress Notes (Signed)
  Radiation Oncology         [phone] ________________________________  Name: Sarha Bartelt MRN: [id number]  Date: 04/03/2017  DOB: 07-27-1966   SIMULATION AND TREATMENT PLANNING NOTE  DIAGNOSIS:     ICD-10-CM   1. Rectal cancer ([place]) C20      The patient presented for simulation To resume her course of radiation for the diagnosis of rectal cancer. The patient was placed in a supine position. A customized vac-lock bag was constructed to aid in patient immobilization on. This complex treatment device will be used on a daily basis during the treatment. In this fashion a CT scan was obtained through the pelvic region and the isocenter was placed near midline within the pelvis. Surface markings were placed.  The patient's imaging was loaded into the radiation treatment planning system. The patient will initially be planned to receive a course of radiation to a total dose of 45 Gy. This will be accomplished in 25 fractions at 1.8 gray per fraction. We will resume the patient's treatment where she left off after she discontinued her treatment. The patient does wish to proceed with a full course of radiation. This initial treatment will correspond to a 3-D conformal technique. The target has been contoured in addition to the rectum, bladder and femoral heads. Dose volume histograms of each of these structures have been requested and these will be carefully reviewed as part of the 3-D conformal treatment planning process. To accomplish this initial treatment, 4 customized blocks have been designed for this purpose. Each of these 4 complex treatment devices will be used on a daily basis during the initial course of the treatment. It is anticipated that the patient will then receive a boost for an additional 5.4 Gy. The anticipated total dose therefore will be 50.4 Gy.    Special treatment procedure The patient will receive chemotherapy during the course of radiation treatment. The patient may experience  increased or overlapping toxicity due to this combined-modality approach and the patient will be monitored for such problems. This may include extra lab work as necessary. This therefore constitutes a special treatment procedure.    ________________________________  Jodelle Gross, MD, PhD

## 2017-06-29 ENCOUNTER — Encounter (HOSPITAL_COMMUNITY): Payer: Self-pay | Admitting: General Surgery

## 2017-06-29 NOTE — Addendum Note (Signed)
Addendum  created 06/29/17 1033 by Nolon Nations, MD   Anesthesia Event edited, Anesthesia Staff edited

## 2017-07-09 ENCOUNTER — Inpatient Hospital Stay (HOSPITAL_COMMUNITY)
Admission: EM | Admit: 2017-07-09 | Discharge: 2017-07-21 | DRG: 919 | Disposition: A | Discharge status: Skilled Nursing Facility | Payer: Medicaid Other | Attending: General Surgery | Admitting: General Surgery

## 2017-07-09 DIAGNOSIS — D62 Acute posthemorrhagic anemia: Secondary | ICD-10-CM | POA: Diagnosis present

## 2017-07-09 DIAGNOSIS — T8130XA Disruption of wound, unspecified, initial encounter: Secondary | ICD-10-CM | POA: Diagnosis present

## 2017-07-09 DIAGNOSIS — E876 Hypokalemia: Secondary | ICD-10-CM | POA: Diagnosis not present

## 2017-07-09 DIAGNOSIS — F1010 Alcohol abuse, uncomplicated: Secondary | ICD-10-CM | POA: Diagnosis present

## 2017-07-09 DIAGNOSIS — T8131XA Disruption of external operation (surgical) wound, not elsewhere classified, initial encounter: Principal | ICD-10-CM | POA: Diagnosis present

## 2017-07-09 DIAGNOSIS — N898 Other specified noninflammatory disorders of vagina: Secondary | ICD-10-CM | POA: Diagnosis present

## 2017-07-09 DIAGNOSIS — Z9119 Patient's noncompliance with other medical treatment and regimen: Secondary | ICD-10-CM

## 2017-07-09 DIAGNOSIS — K7460 Unspecified cirrhosis of liver: Secondary | ICD-10-CM | POA: Diagnosis present

## 2017-07-09 DIAGNOSIS — I10 Essential (primary) hypertension: Secondary | ICD-10-CM

## 2017-07-09 DIAGNOSIS — Y838 Other surgical procedures as the cause of abnormal reaction of the patient, or of later complication, without mention of misadventure at the time of the procedure: Secondary | ICD-10-CM | POA: Diagnosis present

## 2017-07-09 DIAGNOSIS — K219 Gastro-esophageal reflux disease without esophagitis: Secondary | ICD-10-CM

## 2017-07-09 DIAGNOSIS — Z933 Colostomy status: Secondary | ICD-10-CM

## 2017-07-09 DIAGNOSIS — F329 Major depressive disorder, single episode, unspecified: Secondary | ICD-10-CM | POA: Diagnosis present

## 2017-07-09 DIAGNOSIS — T8189XA Other complications of procedures, not elsewhere classified, initial encounter: Secondary | ICD-10-CM

## 2017-07-09 DIAGNOSIS — K140 Glossitis: Secondary | ICD-10-CM | POA: Diagnosis not present

## 2017-07-09 DIAGNOSIS — D509 Iron deficiency anemia, unspecified: Secondary | ICD-10-CM | POA: Diagnosis present

## 2017-07-09 DIAGNOSIS — F17200 Nicotine dependence, unspecified, uncomplicated: Secondary | ICD-10-CM | POA: Diagnosis present

## 2017-07-09 DIAGNOSIS — Z923 Personal history of irradiation: Secondary | ICD-10-CM

## 2017-07-09 DIAGNOSIS — F1410 Cocaine abuse, uncomplicated: Secondary | ICD-10-CM | POA: Diagnosis present

## 2017-07-09 DIAGNOSIS — F3175 Bipolar disorder, in partial remission, most recent episode depressed: Secondary | ICD-10-CM

## 2017-07-09 DIAGNOSIS — Z681 Body mass index (BMI) 19 or less, adult: Secondary | ICD-10-CM

## 2017-07-09 DIAGNOSIS — F17210 Nicotine dependence, cigarettes, uncomplicated: Secondary | ICD-10-CM | POA: Diagnosis present

## 2017-07-09 DIAGNOSIS — F319 Bipolar disorder, unspecified: Secondary | ICD-10-CM | POA: Diagnosis present

## 2017-07-09 DIAGNOSIS — C20 Malignant neoplasm of rectum: Secondary | ICD-10-CM | POA: Diagnosis present

## 2017-07-09 DIAGNOSIS — E43 Unspecified severe protein-calorie malnutrition: Secondary | ICD-10-CM | POA: Diagnosis present

## 2017-07-09 DIAGNOSIS — Z880 Allergy status to penicillin: Secondary | ICD-10-CM

## 2017-07-09 NOTE — ED Notes (Signed)
Bed: IR48 Expected date:  Expected time:  Means of arrival:  Comments: EMS 51 yo female with hx rectal cancer and post surgery-now bleeding rectally

## 2017-07-10 ENCOUNTER — Encounter (HOSPITAL_COMMUNITY): Payer: Self-pay | Admitting: Emergency Medicine

## 2017-07-10 DIAGNOSIS — E43 Unspecified severe protein-calorie malnutrition: Secondary | ICD-10-CM | POA: Diagnosis present

## 2017-07-10 DIAGNOSIS — Z681 Body mass index (BMI) 19 or less, adult: Secondary | ICD-10-CM | POA: Diagnosis not present

## 2017-07-10 DIAGNOSIS — F319 Bipolar disorder, unspecified: Secondary | ICD-10-CM | POA: Diagnosis present

## 2017-07-10 DIAGNOSIS — K140 Glossitis: Secondary | ICD-10-CM | POA: Diagnosis not present

## 2017-07-10 DIAGNOSIS — F17200 Nicotine dependence, unspecified, uncomplicated: Secondary | ICD-10-CM | POA: Diagnosis present

## 2017-07-10 DIAGNOSIS — C20 Malignant neoplasm of rectum: Secondary | ICD-10-CM | POA: Diagnosis present

## 2017-07-10 DIAGNOSIS — D509 Iron deficiency anemia, unspecified: Secondary | ICD-10-CM | POA: Diagnosis present

## 2017-07-10 DIAGNOSIS — I10 Essential (primary) hypertension: Secondary | ICD-10-CM | POA: Diagnosis present

## 2017-07-10 DIAGNOSIS — K7460 Unspecified cirrhosis of liver: Secondary | ICD-10-CM | POA: Diagnosis present

## 2017-07-10 DIAGNOSIS — Z9119 Patient's noncompliance with other medical treatment and regimen: Secondary | ICD-10-CM | POA: Diagnosis not present

## 2017-07-10 DIAGNOSIS — F17210 Nicotine dependence, cigarettes, uncomplicated: Secondary | ICD-10-CM | POA: Diagnosis present

## 2017-07-10 DIAGNOSIS — T8131XA Disruption of external operation (surgical) wound, not elsewhere classified, initial encounter: Secondary | ICD-10-CM | POA: Diagnosis present

## 2017-07-10 DIAGNOSIS — Z933 Colostomy status: Secondary | ICD-10-CM | POA: Diagnosis not present

## 2017-07-10 DIAGNOSIS — Y838 Other surgical procedures as the cause of abnormal reaction of the patient, or of later complication, without mention of misadventure at the time of the procedure: Secondary | ICD-10-CM | POA: Diagnosis present

## 2017-07-10 DIAGNOSIS — T8130XA Disruption of wound, unspecified, initial encounter: Secondary | ICD-10-CM | POA: Diagnosis present

## 2017-07-10 DIAGNOSIS — F1410 Cocaine abuse, uncomplicated: Secondary | ICD-10-CM | POA: Diagnosis present

## 2017-07-10 DIAGNOSIS — N898 Other specified noninflammatory disorders of vagina: Secondary | ICD-10-CM | POA: Diagnosis present

## 2017-07-10 DIAGNOSIS — F1010 Alcohol abuse, uncomplicated: Secondary | ICD-10-CM | POA: Diagnosis present

## 2017-07-10 DIAGNOSIS — E876 Hypokalemia: Secondary | ICD-10-CM | POA: Diagnosis not present

## 2017-07-10 DIAGNOSIS — F329 Major depressive disorder, single episode, unspecified: Secondary | ICD-10-CM | POA: Diagnosis present

## 2017-07-10 DIAGNOSIS — Z923 Personal history of irradiation: Secondary | ICD-10-CM | POA: Diagnosis not present

## 2017-07-10 DIAGNOSIS — Z880 Allergy status to penicillin: Secondary | ICD-10-CM | POA: Diagnosis not present

## 2017-07-10 DIAGNOSIS — D62 Acute posthemorrhagic anemia: Secondary | ICD-10-CM | POA: Diagnosis present

## 2017-07-10 LAB — CBC WITH DIFFERENTIAL/PLATELET
Basophils Absolute: 0 10*3/uL (ref 0.0–0.1)
Basophils Relative: 0 %
EOS PCT: 1 %
Eosinophils Absolute: 0.1 10*3/uL (ref 0.0–0.7)
HEMATOCRIT: 27 % — AB (ref 36.0–46.0)
Hemoglobin: 9.2 g/dL — ABNORMAL LOW (ref 12.0–15.0)
LYMPHS ABS: 0.7 10*3/uL (ref 0.7–4.0)
LYMPHS PCT: 9 %
MCH: 28.1 pg (ref 26.0–34.0)
MCHC: 34.1 g/dL (ref 30.0–36.0)
MCV: 82.6 fL (ref 78.0–100.0)
MONO ABS: 0.7 10*3/uL (ref 0.1–1.0)
Monocytes Relative: 10 %
Neutro Abs: 5.9 10*3/uL (ref 1.7–7.7)
Neutrophils Relative %: 80 %
PLATELETS: 186 10*3/uL (ref 150–400)
RBC: 3.27 MIL/uL — AB (ref 3.87–5.11)
RDW: 16.2 % — ABNORMAL HIGH (ref 11.5–15.5)
WBC: 7.4 10*3/uL (ref 4.0–10.5)

## 2017-07-10 LAB — BASIC METABOLIC PANEL
ANION GAP: 8 (ref 5–15)
BUN: 8 mg/dL (ref 6–20)
CALCIUM: 8.1 mg/dL — AB (ref 8.9–10.3)
CO2: 27 mmol/L (ref 22–32)
CREATININE: 0.46 mg/dL (ref 0.44–1.00)
Chloride: 97 mmol/L — ABNORMAL LOW (ref 101–111)
GLUCOSE: 98 mg/dL (ref 65–99)
Potassium: 3.5 mmol/L (ref 3.5–5.1)
Sodium: 132 mmol/L — ABNORMAL LOW (ref 135–145)

## 2017-07-10 LAB — COMPREHENSIVE METABOLIC PANEL
ALT: 9 U/L — AB (ref 14–54)
AST: 14 U/L — ABNORMAL LOW (ref 15–41)
Albumin: 2.4 g/dL — ABNORMAL LOW (ref 3.5–5.0)
Alkaline Phosphatase: 56 U/L (ref 38–126)
Anion gap: 12 (ref 5–15)
BILIRUBIN TOTAL: 0.6 mg/dL (ref 0.3–1.2)
BUN: 8 mg/dL (ref 6–20)
CALCIUM: 8.3 mg/dL — AB (ref 8.9–10.3)
CHLORIDE: 89 mmol/L — AB (ref 101–111)
CO2: 26 mmol/L (ref 22–32)
CREATININE: 0.5 mg/dL (ref 0.44–1.00)
Glucose, Bld: 90 mg/dL (ref 65–99)
Potassium: 2.6 mmol/L — CL (ref 3.5–5.1)
Sodium: 127 mmol/L — ABNORMAL LOW (ref 135–145)
TOTAL PROTEIN: 6.6 g/dL (ref 6.5–8.1)

## 2017-07-10 LAB — RAPID URINE DRUG SCREEN, HOSP PERFORMED
AMPHETAMINES: NOT DETECTED
Barbiturates: NOT DETECTED
Benzodiazepines: NOT DETECTED
Cocaine: POSITIVE — AB
OPIATES: NOT DETECTED
Tetrahydrocannabinol: NOT DETECTED

## 2017-07-10 LAB — ETHANOL: Alcohol, Ethyl (B): 141 mg/dL — ABNORMAL HIGH (ref <5)

## 2017-07-10 LAB — PROTIME-INR
INR: 1.15
PROTHROMBIN TIME: 14.6 s (ref 11.4–15.2)

## 2017-07-10 LAB — APTT: APTT: 34 s (ref 24–36)

## 2017-07-10 MED ORDER — FLUOXETINE HCL 20 MG PO CAPS
20.0000 mg | ORAL_CAPSULE | Freq: Every day | ORAL | Status: DC
  Administered 2017-07-10 – 2017-07-21 (×12): 20 mg via ORAL
  Filled 2017-07-10 (×12): qty 1

## 2017-07-10 MED ORDER — ALBUTEROL SULFATE (2.5 MG/3ML) 0.083% IN NEBU: 2.5000 mg | INHALATION_SOLUTION | Freq: Four times a day (QID) | RESPIRATORY_TRACT | Status: DC | PRN

## 2017-07-10 MED ORDER — CARVEDILOL 6.25 MG PO TABS
6.2500 mg | ORAL_TABLET | Freq: Two times a day (BID) | ORAL | Status: DC
  Administered 2017-07-10 – 2017-07-21 (×23): 6.25 mg via ORAL
  Filled 2017-07-10 (×23): qty 1

## 2017-07-10 MED ORDER — DEXTROSE-NACL 5-0.9 % IV SOLN
INTRAVENOUS | Status: DC
  Administered 2017-07-10 – 2017-07-11 (×3): via INTRAVENOUS

## 2017-07-10 MED ORDER — POTASSIUM CHLORIDE CRYS ER 20 MEQ PO TBCR
40.0000 meq | EXTENDED_RELEASE_TABLET | Freq: Once | ORAL | Status: AC
  Administered 2017-07-10: 40 meq via ORAL
  Filled 2017-07-10: qty 2

## 2017-07-10 MED ORDER — ENSURE ENLIVE PO LIQD
237.0000 mL | Freq: Two times a day (BID) | ORAL | Status: DC
  Administered 2017-07-10 – 2017-07-14 (×9): 237 mL via ORAL

## 2017-07-10 MED ORDER — DAKINS (1/4 STRENGTH) 0.125 % EX SOLN
1.0000 "application " | Freq: Every day | CUTANEOUS | Status: AC
  Administered 2017-07-10 – 2017-07-12 (×3): 1
  Filled 2017-07-10: qty 473

## 2017-07-10 MED ORDER — OXYCODONE HCL 5 MG PO TABS
5.0000 mg | ORAL_TABLET | Freq: Four times a day (QID) | ORAL | Status: DC | PRN
  Administered 2017-07-10 – 2017-07-14 (×16): 10 mg via ORAL
  Filled 2017-07-10 (×18): qty 2

## 2017-07-10 MED ORDER — POTASSIUM CHLORIDE 10 MEQ/100ML IV SOLN
10.0000 meq | Freq: Once | INTRAVENOUS | Status: AC
  Administered 2017-07-10: 10 meq via INTRAVENOUS
  Filled 2017-07-10: qty 100

## 2017-07-10 MED ORDER — ADULT MULTIVITAMIN W/MINERALS CH
1.0000 | ORAL_TABLET | Freq: Every day | ORAL | Status: DC
  Administered 2017-07-10 – 2017-07-21 (×12): 1 via ORAL
  Filled 2017-07-10 (×12): qty 1

## 2017-07-10 MED ORDER — PANTOPRAZOLE SODIUM 40 MG PO TBEC
40.0000 mg | DELAYED_RELEASE_TABLET | Freq: Every day | ORAL | Status: DC
  Administered 2017-07-10 – 2017-07-21 (×12): 40 mg via ORAL
  Filled 2017-07-10 (×12): qty 1

## 2017-07-10 MED ORDER — ENOXAPARIN SODIUM 30 MG/0.3ML ~~LOC~~ SOLN
30.0000 mg | SUBCUTANEOUS | Status: DC
  Administered 2017-07-10: 30 mg via SUBCUTANEOUS
  Filled 2017-07-10: qty 0.3

## 2017-07-10 NOTE — Progress Notes (Addendum)
Initial Nutrition Assessment  DOCUMENTATION CODES:   Not applicable  INTERVENTION:   Ensure Enlive po BID, each supplement provides 350 kcal and 20 grams of protein  Provide MVI daily  NUTRITION DIAGNOSIS:   Malnutrition (Severe) related to social / environmental circumstances, chronic illness (Polysubstance abuse, rectal cancer) as evidenced by 11% weight loss in 5 month, energy intake < or equal to 50% for > or equal to 1 month, moderate depletion of body fat, severe depletion of muscle mass.  GOAL:   Patient will meet greater than or equal to 90% of their needs  MONITOR:   PO intake, Supplement acceptance, Labs, Weight trends  REASON FOR ASSESSMENT:   Consult Assessment of nutrition requirement/status  ASSESSMENT:   Pt with PMH of polysubstance abuse, cirrhosis, rectal cancer (currently undergoing chemotherapy and radiation), depression, and HTN. Pt recently admitted 8/16 for perineal resection, discharged 8/20. Presents this admission with c/o falling at home resulting in dehiscence wound in the perineum.   Spoke with pt at bedside. Pt admits to using cocaine post previous discharge. Reports having loss in appetite for 1 month related to drug use. Pt consumed one meal per day that consisted of "milk and crackers or frozen dinners" prior to readmission. States I'm not hungry when I use." Suspect pt has not consumed > 50% of estimated energy requirement for > 1 month. Discussed the importance of adequate calories/protein with wound status. Talked about high calorie high protein options. Pt verbalized understanding. Included protein supplement information in discharge instructions. Anticipating discharge to SNF, pt expressed relief that she would be able to eat three meals a day and stop using.   Records show pt has lost 11% of body wt in 5 months. This is significant. Nutrition-Focused physical exam completed. Findings are moderate to severe fat depletion, severe muscle  depletion, and no edema. RD to provide supplementation, order MVI, and encourage PO intake.  Medications reviewed and include: NS with D5 @ 75 ml/hr Labs reviewed: Na 127 (L) K 2.6 (L) Cl 89 (L) Albumin 2.4 (L)   Diet Order:  Diet regular Room service appropriate? Yes; Fluid consistency: Thin 155 Skin:   (open wound perineum)  Last BM:  PTA  Height:   Ht Readings from Last 1 Encounters:  07/10/17 5\' 2"  (1.575 m)    Weight:   Wt Readings from Last 1 Encounters:  07/10/17 108 lb 8 oz (49.2 kg)    Ideal Body Weight:  50 kg  BMI:  Body mass index is 19.84 kg/m.  Estimated Nutritional Needs:   Kcal:  1550-1750 (32-36 kcal/kg)  Protein:  80-90 grams (1.6-1.8 g/kg)  Fluid:  >1.5 L/day  EDUCATION NEEDS:   Education needs addressed  [place], LDN Clinical Nutrition Pager # [phone]

## 2017-07-10 NOTE — ED Notes (Signed)
EKG given to EDP,Nanavati,MD., for review. 

## 2017-07-10 NOTE — Consult Note (Signed)
[facility] Nurse wound consult note Reason for Consult: Dehiscence of perineal wound, foul odor. Separation of midline abdominal wound, superficial. Albumin 2.4, current drug use and self neglect.  Has been caring for the ostomy at home without any trouble  1 piece pouches left at bedside.  LLQ colostomy care Wound type: Dehisced surgical wounds.  Perineal wound with deep defect, unable to visualize wound bed.  Will begin 3 days of Dakin's solution moist gauze.  Then NS moist gauze.  Needs nutritional support and rehab for her drug addiction.  Pressure Injury POA:  NA Measurement:Perineum:  8 cm x 6 cm x 4.3 cm  Midline incision:  5 cm x 0.5 cm with thin adherent slough to wound bed.  NS moist gauze to this wound daily.  Wound bed: Perineum Dark Drainage (amount, consistency, odor) FOUL Necrotic odor  Gray, thin drainage. None from midline wound Periwound:intact Dressing procedure/placement/frequency:Cleanse perineal wound with NS.  Gently fill wound defect with Dakin's moist kerlix X3 days.  Then, begin NS moist kerlix.  Cover with ABD Pads and tape. Change daily.   Midline abdominal wound:  Cleanse with NS.  NS moist 2x2 to wound bed.  Cover with dry gauze and tape.  Change daily.  [facility] Nurse ostomy consult note Stoma type/location: LLQ colostomy Stomal assessment/size: 1 1/2" pink and moist Peristomal assessment: intact Treatment options for stomal/peristomal skin: barrier ring 1 piece convex pouch Output soft brown stool Ostomy pouching: 1pc.convex pouch Education provided: Patient has been caring for this at home.  States she is doing fine with it.  She demonstrates cutting the barrier and roll closure.   Enrolled patient in [place] program: Yes Domenic Moras RN BSN [facility] Pager [phone]

## 2017-07-10 NOTE — ED Provider Notes (Signed)
[facility] DEPT Provider Note   CSN: [id number] Arrival date & time: 07/09/17  2349     History   Chief Complaint Chief Complaint  Patient presents with  . Wound Dehiscence    HPI Sherry Baker is a 51 y.o. female.  The history is provided by the patient and medical records.     51 year old female with history of alcohol abuse, allergies, arthritis, bipolar disorder, depression, hypertension, rectal cancer status post chemotherapy and radiation ending in June 2018, abdominal perineal resection on 06/18/17 with Dr. Marcello Moores, presenting to the ED for rectal bleeding. Patient states since her surgery she has been doing fairly well. She now has ostomy is continued having normal output. States this evening she noticed some mixed bright red dark blood coming from her rectum. Initially said no, but later said she did happen to fall today. She does not really give any further details on this but states it did feel like her bottom was "stretched" when she fell. She does admit to drinking a 40 ounce beer today and using some cocaine. She denies any current abdominal pain, fever, or chills. Patient not currently on anti-coagulation.  Patient states she has been getting dressing changes from home wound care services, states no issues until today.  Past Medical History:  Diagnosis Date  . Alcohol abuse   . Allergy    PCNS swelling  . Arthritis   . Bipolar 1 disorder ([place])   . Cancer ([place]) 01/21/2017   rectal cancer  . Cirrhosis of liver ([place])   . Depression   . Genetic testing 03/24/2017   Ms. Yore underwent genetic counseling and testing for hereditary cancer syndromes on 02/17/2017. Her results were negative for mutations in all 46 genes analyzed by [facility]'s 46-gene Common Hereditary Cancers Panel. Genes analyzed include: APC, ATM, AXIN2, BARD1, BMPR1A, BRCA1, BRCA2, BRIP1, CDH1, CDKN2A, CHEK2, CTNNA1, DICER1, EPCAM, GREM1, HOXB13, KIT, MEN1, MLH1, MSH2, MSH3, MSH6, MUTYH, NBN,  .  Hypertension     Patient Active Problem List   Diagnosis Date Noted  . Genetic testing 03/24/2017  . Hypertension 02/18/2017  . GERD (gastroesophageal reflux disease) 02/18/2017  . Rectal cancer ([place]) 01/27/2017  . Abdominal pain, epigastric   . Rectal mass   . Fever   . Anemia 01/16/2017  . Abdominal pain   . Constipation   . Alcohol abuse   . Tobacco abuse   . Cocaine abuse   . Bipolar affective disorder ([place])   . Protein-calorie malnutrition, severe ([place]) 02/13/2014  . Suicide ideation 02/12/2014  . Suicidal ideation 02/11/2014  . Alcohol intoxication ([place]) 02/11/2014  . Transaminitis 02/11/2014  . Substance abuse 02/11/2014  . Depression 02/11/2014  . Thrombocytopenia ([place]) 02/11/2014    Past Surgical History:  Procedure Laterality Date  . ABDOMINAL PERINEAL BOWEL RESECTION N/A 06/18/2017   Procedure: ABDOMINAL PERINEAL RESECTION ERAS PATHWAY;  Surgeon: Leighton Ruff, MD;  Location: [facility];  Service: General;  Laterality: N/A;  . COLONOSCOPY WITH PROPOFOL Left 01/21/2017   Procedure: COLONOSCOPY WITH PROPOFOL;  Surgeon: Otis Brace, MD;  Location: [facility];  Service: Gastroenterology;  Laterality: Left;  Marland Kitchen MANDIBLE FRACTURE SURGERY      OB History    No data available       Home Medications    Prior to Admission medications   Medication Sig Start Date End Date Taking? Authorizing Provider  albuterol (PROVENTIL) (2.5 MG/3ML) 0.083% nebulizer solution Take 3 mLs (2.5 mg total) by nebulization every 6 (six) hours as needed for wheezing or  shortness of breath. Patient not taking: Reported on 05/13/2017 01/26/17   Brayton Caves, PA-C  carvedilol (COREG) 6.25 MG tablet Take 1 tablet (6.25 mg total) by mouth 2 (two) times daily with a meal. Patient not taking: Reported on 05/13/2017 02/18/17   Arnoldo Morale, MD  ferrous sulfate (QC FERROUS SULFATE) 325 (65 FE) MG tablet Take 1 tablet (325 mg total) by mouth 2 (two) times daily with a meal. Patient not taking:  Reported on 05/13/2017 01/26/17   Brayton Caves, PA-C  FLUoxetine (PROZAC) 20 MG tablet Take 1 tablet (20 mg total) by mouth daily. Patient not taking: Reported on 05/13/2017 02/18/17   Arnoldo Morale, MD  oxyCODONE (OXY IR/ROXICODONE) 5 MG immediate release tablet Take 1-2 tablets (5-10 mg total) by mouth every 6 (six) hours as needed for severe pain or breakthrough pain. 05/04/62   Leighton Ruff, MD  pantoprazole (PROTONIX) 40 MG tablet Take 1 tablet (40 mg total) by mouth daily. Patient not taking: Reported on 04/21/2017 02/18/17   Arnoldo Morale, MD  prochlorperazine (COMPAZINE) 10 MG tablet Take 1 tablet (10 mg total) by mouth every 6 (six) hours as needed for nausea or vomiting. Patient not taking: Reported on 05/13/2017 02/27/17   Kyung Rudd, MD    Family History Family History  Problem Relation Age of Onset  . Cancer Mother 88       Originating in the abdomen, otherwise unknwon  . Cancer - Other Maternal Aunt        Throat    Social History Social History  Substance Use Topics  . Smoking status: Current Every Day Smoker    Packs/day: 0.50    Years: 41.00    Types: Cigarettes  . Smokeless tobacco: Former Systems developer     Comment: 7 cigs daily  . Alcohol use 1.2 oz/week    2 Cans of beer per week     Comment: last drink two weeks ago     Allergies   Penicillins   Review of Systems Review of Systems  Gastrointestinal: Positive for anal bleeding and rectal pain.  All other systems reviewed and are negative.    Physical Exam Updated Vital Signs Ht '5\' 2"'  (1.575 m)   Wt 46.3 kg (102 lb)   SpO2 99%   BMI 18.66 kg/m   Physical Exam  Constitutional: She is oriented to person, place, and time. She appears well-developed and well-nourished.  HENT:  Head: Normocephalic and atraumatic.  Mouth/Throat: Oropharynx is clear and moist.  Eyes: Pupils are equal, round, and reactive to light. Conjunctivae and EOM are normal.  Neck: Normal range of motion.  Cardiovascular: Normal rate,  regular rhythm and normal heart sounds.   Pulmonary/Chest: Effort normal and breath sounds normal.  Abdominal: Soft. Bowel sounds are normal.  Ostomy present, stool present in collection bag, no blood  Genitourinary:  Genitourinary Comments: Large opening of the rectal area, there is some clot within as well as some oozing blood; no pulsatile bleeding, foul smell present (see photo below)  Musculoskeletal: Normal range of motion.  Neurological: She is alert and oriented to person, place, and time.  Skin: Skin is warm and dry.  Psychiatric: She has a normal mood and affect.  Nursing note and vitals reviewed.      ED Treatments / Results  Labs (all labs ordered are listed, but only abnormal results are displayed) Labs Reviewed - No data to display  EKG  EKG Interpretation None       Radiology No results  found.  Procedures Procedures (including critical care time)  Medications Ordered in ED Medications - No data to display   Initial Impression / Assessment and Plan / ED Course  I have reviewed the triage vital signs and the nursing notes.  Pertinent labs & imaging results that were available during my care of the patient were reviewed by me and considered in my medical decision making (see chart for details).  51 year old female here with rectal bleeding. She has a history of rectal cancer status post chemotherapy and radiation as well as rectal perennial resection on 06/18/2017. Has been doing fairly well until this evening when she started having a lot of rectal bleeding. Patient hemodynamically stable here. On examination, it appears she has dehisced her rectal incision. See photos above. There is some clot within wound as well as some oozing of blood. There is no pulsatile bleeding. Continues to have good output from ostomy without any bloody stool present. Patient does admit to using cocaine and alcohol this evening. Will send labs. Plan discussed with general surgery  for recommendations.  Discussed with general surgery, Dr. Zella Richer-- has evaluated in the ED, will admit for ongoing wound care.  Final Clinical Impressions(s) / ED Diagnoses   Final diagnoses:  Problem involving surgical incision  Wound dehiscence    New Prescriptions New Prescriptions   No medications on file     Larene Pickett, PA-C 07/10/17 [place], [place], MD 07/10/17 [phone]

## 2017-07-10 NOTE — H&P (Signed)
Sherry Baker is an 51 y.o. female.   Chief Complaint: Bleeding from wound HPI: This is a 51 year old female with rectal cancer.  Chemotherapy and radiation therapy.  On August 16 she underwent an abdominal perineal resection by Dr. Marcello Moores on August 16.  She was discharged on August 20.  She states she fell 2 days ago.  She has been having some wound drainage.  Yesterday she felt that the wound opened up and she started having bloody drainage and presented to the emergency department.  She admits to using cocaine  and drinking some beer yesterday.  No abdominal pain or fever  Past Medical History:  Diagnosis Date  . Alcohol abuse   . Allergy    PCNS swelling  . Arthritis   . Bipolar 1 disorder ([place])   . Cancer ([place]) 01/21/2017   rectal cancer  . Cirrhosis of liver ([place])   . Depression   . Genetic testing 03/24/2017   [name] underwent genetic counseling and testing for hereditary cancer syndromes on 02/17/2017. Her results were negative for mutations in all 46 genes analyzed by [facility]'s 46-gene Common Hereditary Cancers Panel. Genes analyzed include: APC, ATM, AXIN2, BARD1, BMPR1A, BRCA1, BRCA2, BRIP1, CDH1, CDKN2A, CHEK2, CTNNA1, DICER1, EPCAM, GREM1, HOXB13, KIT, MEN1, MLH1, MSH2, MSH3, MSH6, MUTYH, NBN,  . Hypertension     Past Surgical History:  Procedure Laterality Date  . ABDOMINAL PERINEAL BOWEL RESECTION N/A 06/18/2017   Procedure: ABDOMINAL PERINEAL RESECTION ERAS PATHWAY;  Surgeon: Leighton Ruff, MD;  Location: [facility];  Service: General;  Laterality: N/A;  . COLONOSCOPY WITH PROPOFOL Left 01/21/2017   Procedure: COLONOSCOPY WITH PROPOFOL;  Surgeon: Otis Brace, MD;  Location: [facility];  Service: Gastroenterology;  Laterality: Left;  Marland Kitchen MANDIBLE FRACTURE SURGERY      Family History  Problem Relation Age of Onset  . Cancer Mother 1       Originating in the abdomen, otherwise unknwon  . Cancer - Other Maternal Aunt        Throat   Social History:  reports that she  has been smoking Cigarettes.  She has a 20.50 pack-year smoking history. She has quit using smokeless tobacco. She reports that she drinks about 1.2 oz of alcohol per week . She reports that she uses drugs, including Marijuana, "Crack" cocaine, and Cocaine.  Allergies:  Allergies  Allergen Reactions  . Penicillins Hives and Swelling    Has patient had a PCN reaction causing immediate rash, facial/tongue/throat swelling, SOB or lightheadedness with hypotension: Yes Has patient had a PCN reaction causing severe rash involving mucus membranes or skin necrosis: No Has patient had a PCN reaction that required hospitalization No Has patient had a PCN reaction occurring within the last 10 years: Yes If all of the above answers are "NO", then may proceed with Cephalosporin use.      (Not in a hospital admission)  Results for orders placed or performed during the hospital encounter of 07/09/17 (from the past 48 hour(s))  Rapid urine drug screen (hospital performed)     Status: Abnormal   Collection Time: 07/10/17  1:24 AM  Result Value Ref Range   Opiates NONE DETECTED NONE DETECTED   Cocaine POSITIVE (A) NONE DETECTED   Benzodiazepines NONE DETECTED NONE DETECTED   Amphetamines NONE DETECTED NONE DETECTED   Tetrahydrocannabinol NONE DETECTED NONE DETECTED   Barbiturates NONE DETECTED NONE DETECTED    Comment:        DRUG SCREEN FOR MEDICAL PURPOSES ONLY.  IF CONFIRMATION IS  NEEDED FOR ANY PURPOSE, NOTIFY LAB WITHIN 5 DAYS.        LOWEST DETECTABLE LIMITS FOR URINE DRUG SCREEN Drug Class       Cutoff (ng/mL) Amphetamine      1000 Barbiturate      200 Benzodiazepine   277 Tricyclics       412 Opiates          300 Cocaine          300 THC              50    No results found.  Review of Systems  Constitutional: Negative for chills and fever.  Gastrointestinal: Negative for abdominal pain, nausea and vomiting.    Height '5\' 2"'  (1.575 m), weight 46.3 kg (102 lb), SpO2 99  %. Physical Exam  Constitutional: No distress.  Thin female who appears older than stated age.  GI: Soft.  Midline incision was superficial separation and some light greenish drainage.  Left-sided colostomy draining stool.  Genitourinary:  Genitourinary Comments: In the perineum, there is a large open wound with a foul odor and some blood clot in it.  Neurological: She is alert.  Skin: Skin is warm and dry.  Psychiatric: She has a normal mood and affect.     Assessment/Plan 1.  Wound dehiscence in the perineum.  Wound is a foul odor.  I packed this with saline moistened gauze.  There is no pus draining from the wound.  2.  Superficial wound separation abdominal wall. 3.  Polysubstance abuse.  Plan: Admit to the hospital and start wound care.  Likely will need a skilled nursing facility for this complex wound care.  We will get social work to see her later today.  Drug screen is pending.  [name], MD 07/10/2017, 2:02 AM

## 2017-07-10 NOTE — ED Triage Notes (Signed)
Patient BIB [facility] from home due to bleeding from surgical site above rectum. Pt states the surgery was about one month ago for rectal CA. Patient noticed bleeding this evening around 2200. Patient admits to drinking a "40" alcohol and using cocaine around 2200. colostomy bag intact. EMS also noticed bed bug on stretcher when moving patient into room.

## 2017-07-10 NOTE — Care Management Note (Signed)
Case Management Note  Patient Details  Name: Sherry Baker MRN: [id number] Date of Birth: Nov 14, 1965  Subjective/Objective:                  51 year old female with rectal cancer.  Chemotherapy and radiation therapy.  On August 16 she underwent an abdominal perineal resection by Dr. Marcello Moores on August 16.  She was discharged on August 20.  She states she fell 2 days ago.  She has been having some wound drainage.  Yesterday she felt that the wound opened up and she started having bloody drainage and presented to the emergency department.  She admits to using cocaine  and drinking some beer yesterday.  No abdominal pain or fever  Action/Plan: Date:  July 10, 2017 Chart reviewed for concurrent status and case management needs. Will continue to follow patient progress. Discharge Planning: following for needs Expected discharge date: [id number] [name], BSN, [place], [place]  Expected Discharge Date:                  Expected Discharge Plan:  [place]  In-House Referral:     Discharge planning Services  CM Consult  Post Acute Care Choice:  Resumption of Svcs/PTA Provider Choice offered to:     DME Arranged:    DME Agency:     HH Arranged:    [place] Agency:     Status of Service:  In process, will continue to follow  If discussed at Long Length of Stay Meetings, dates discussed:    Additional Comments:  Leeroy Cha, RN 07/10/2017, 9:09 AM

## 2017-07-10 NOTE — Progress Notes (Signed)
Subjective: Having perineal pain  Objective: Vital signs in last 24 hours: Temp:  [97.6 F (36.4 C)-97.8 F (36.6 C)] 97.6 F (36.4 C) (09/07 0413) Pulse Rate:  [88-101] 97 (09/07 0413) Resp:  [16-24] 16 (09/07 0413) BP: (105-135)/(66-85) 135/79 (09/07 0413) SpO2:  [97 %-100 %] 100 % (09/07 0413) Weight:  [46.3 kg (102 lb)-49.2 kg (108 lb 8 oz)] 49.2 kg (108 lb 8 oz) (09/07 0413)    Intake/Output from previous day: 09/06 0701 - 09/07 0700 In: 211.3 [I.V.:111.3; IV Piggyback:100] Out: -  Intake/Output this shift: No intake/output data recorded.  General appearance: alert and cooperative GI: soft Incision/Wound: large open perineal wound, no obvious necrosis  Lab Results:  Results for orders placed or performed during the hospital encounter of 07/09/17 (from the past 24 hour(s))  Rapid urine drug screen (hospital performed)     Status: Abnormal   Collection Time: 07/10/17  1:24 AM  Result Value Ref Range   Opiates NONE DETECTED NONE DETECTED   Cocaine POSITIVE (A) NONE DETECTED   Benzodiazepines NONE DETECTED NONE DETECTED   Amphetamines NONE DETECTED NONE DETECTED   Tetrahydrocannabinol NONE DETECTED NONE DETECTED   Barbiturates NONE DETECTED NONE DETECTED  CBC with Differential     Status: Abnormal   Collection Time: 07/10/17  2:03 AM  Result Value Ref Range   WBC 7.4 4.0 - 10.5 K/uL   RBC 3.27 (L) 3.87 - 5.11 MIL/uL   Hemoglobin 9.2 (L) 12.0 - 15.0 g/dL   HCT 27.0 (L) 36.0 - 46.0 %   MCV 82.6 78.0 - 100.0 fL   MCH 28.1 26.0 - 34.0 pg   MCHC 34.1 30.0 - 36.0 g/dL   RDW 16.2 (H) 11.5 - 15.5 %   Platelets 186 150 - 400 K/uL   Neutrophils Relative % 80 %   Neutro Abs 5.9 1.7 - 7.7 K/uL   Lymphocytes Relative 9 %   Lymphs Abs 0.7 0.7 - 4.0 K/uL   Monocytes Relative 10 %   Monocytes Absolute 0.7 0.1 - 1.0 K/uL   Eosinophils Relative 1 %   Eosinophils Absolute 0.1 0.0 - 0.7 K/uL   Basophils Relative 0 %   Basophils Absolute 0.0 0.0 - 0.1 K/uL  Comprehensive  metabolic panel     Status: Abnormal   Collection Time: 07/10/17  2:03 AM  Result Value Ref Range   Sodium 127 (L) 135 - 145 mmol/L   Potassium 2.6 (LL) 3.5 - 5.1 mmol/L   Chloride 89 (L) 101 - 111 mmol/L   CO2 26 22 - 32 mmol/L   Glucose, Bld 90 65 - 99 mg/dL   BUN 8 6 - 20 mg/dL   Creatinine, Ser 0.50 0.44 - 1.00 mg/dL   Calcium 8.3 (L) 8.9 - 10.3 mg/dL   Total Protein 6.6 6.5 - 8.1 g/dL   Albumin 2.4 (L) 3.5 - 5.0 g/dL   AST 14 (L) 15 - 41 U/L   ALT 9 (L) 14 - 54 U/L   Alkaline Phosphatase 56 38 - 126 U/L   Total Bilirubin 0.6 0.3 - 1.2 mg/dL   GFR calc non Af Amer >60 >60 mL/min   GFR calc Af Amer >60 >60 mL/min   Anion gap 12 5 - 15  Ethanol     Status: Abnormal   Collection Time: 07/10/17  2:03 AM  Result Value Ref Range   Alcohol, Ethyl (B) 141 (H) <5 mg/dL  Protime-INR     Status: None   Collection Time: 07/10/17  2:03 AM  Result Value Ref Range   Prothrombin Time 14.6 11.4 - 15.2 seconds   INR 1.15   APTT     Status: None   Collection Time: 07/10/17  2:03 AM  Result Value Ref Range   aPTT 34 24 - 36 seconds     Studies/Results Radiology     MEDS, Scheduled . carvedilol  6.25 mg Oral BID WC  . enoxaparin (LOVENOX) injection  30 mg Subcutaneous Q24H  . feeding supplement (ENSURE ENLIVE)  237 mL Oral BID BM  . FLUoxetine  20 mg Oral Daily  . pantoprazole  40 mg Oral Daily     Assessment:  Cocaine/etoh abuse Noncompliance Perineal wound breakdown Malnutrition  Plan: Wound RN to eval wound  Social to plan for ECF placement.  Pt also needs drug rehab Nutrition consult. Ensure supplements for malnutrition    LOS: 0 days    Rosario Adie, [facility], [place] [phone]   07/10/2017 8:14 AM

## 2017-07-11 LAB — BASIC METABOLIC PANEL
Anion gap: 8 (ref 5–15)
BUN: <5 mg/dL — ABNORMAL LOW (ref 6–20)
CALCIUM: 7.7 mg/dL — AB (ref 8.9–10.3)
CO2: 27 mmol/L (ref 22–32)
Chloride: 96 mmol/L — ABNORMAL LOW (ref 101–111)
Glucose, Bld: 119 mg/dL — ABNORMAL HIGH (ref 65–99)
Potassium: 2.8 mmol/L — ABNORMAL LOW (ref 3.5–5.1)
SODIUM: 131 mmol/L — AB (ref 135–145)

## 2017-07-11 MED ORDER — KCL IN DEXTROSE-NACL 30-5-0.45 MEQ/L-%-% IV SOLN
INTRAVENOUS | Status: DC
  Administered 2017-07-11 – 2017-07-12 (×2): via INTRAVENOUS
  Administered 2017-07-14: 10 mL/h via INTRAVENOUS
  Administered 2017-07-14: 02:00:00 via INTRAVENOUS
  Filled 2017-07-11 (×8): qty 1000

## 2017-07-11 MED ORDER — POTASSIUM CHLORIDE 10 MEQ/100ML IV SOLN
10.0000 meq | INTRAVENOUS | Status: AC
  Administered 2017-07-11 (×3): 10 meq via INTRAVENOUS
  Filled 2017-07-11 (×3): qty 100

## 2017-07-11 MED ORDER — ENOXAPARIN SODIUM 40 MG/0.4ML ~~LOC~~ SOLN
40.0000 mg | SUBCUTANEOUS | Status: DC
  Administered 2017-07-11 – 2017-07-21 (×10): 40 mg via SUBCUTANEOUS
  Filled 2017-07-11 (×11): qty 0.4

## 2017-07-11 NOTE — Evaluation (Signed)
Occupational Therapy Evaluation Patient Details Name: Sherry Baker MRN: [id number] DOB: February 11, 1966 Today's Date: 07/11/2017    History of Present Illness 51 yo female admitted with perineum wound dehiscence, separation of midline abdominal wound, fall at home. Hx of rectal cancer, abd/perineal resection 8/16, drug and ETOH abuse   Clinical Impression   Pt was admitted for the above. She has been struggling with adls at home, doing the best she can and hasn't been able to manage wound dehiscence.  Goals in acute are for supervision level    Follow Up Recommendations  SNF    Equipment Recommendations   (tba further, ?3:1)    Recommendations for Other Services       Precautions / Restrictions Precautions Precautions: Fall Precaution Comments: abdominal and perineal wounds Restrictions Weight Bearing Restrictions: No      Mobility Bed Mobility Overal bed mobility: Modified Independent             General bed mobility comments: pt slides to EOB and uses rail to assist herself in/out of bed  Transfers Overall transfer level: Needs assistance Equipment used: None (held to bedrail) Transfers: Sit to/from Stand Sit to Stand: Min guard         General transfer comment: for safety    Balance Overall balance assessment: Needs assistance;History of Falls           Standing balance-Leahy Scale: Poor                             ADL either performed or assessed with clinical judgement   ADL Overall ADL's : Needs assistance/impaired Eating/Feeding: Supervision/ safety (standing)   Grooming: Set up;Standing   Upper Body Bathing: Min guard;Standing   Lower Body Bathing: Minimal assistance (supine to standing)   Upper Body Dressing : Min guard;Standing   Lower Body Dressing: Moderate assistance (supine to standing)                 General ADL Comments: pt has been able to don shorts standing but has difficulty with socks. She is unable to sit  comfortably due to wound.  She has been struggling at home and states she was pretty much on her own.      Vision         Perception     Praxis      Pertinent Vitals/Pain Pain Assessment: 0-10 Pain Score: 7  Pain Location: abdominal, perineal areas Pain Descriptors / Indicators: Sore Pain Intervention(s): Limited activity within patient's tolerance     Hand Dominance     Extremity/Trunk Assessment Upper Extremity Assessment Upper Extremity Assessment: Overall WFL for tasks assessed      Cervical / Trunk Assessment Cervical / Trunk Assessment: Normal   Communication Communication Communication: No difficulties   Cognition Arousal/Alertness: Awake/alert Behavior During Therapy: WFL for tasks assessed/performed Overall Cognitive Status: Within Functional Limits for tasks assessed                                     General Comments       Exercises     Shoulder Instructions      Home Living Family/patient expects to be discharged to:: Skilled nursing facility Living Arrangements: Non-relatives/Friends Available Help at Discharge:  (hasn't had help)  Additional Comments: has standard commode and tub       Prior Functioning/Environment Level of Independence: Independent                 OT Problem List: Decreased strength;Decreased activity tolerance;Pain;Impaired balance (sitting and/or standing);Decreased knowledge of use of DME or AE      OT Treatment/Interventions: Self-care/ADL training;DME and/or AE instruction;Balance training;Patient/family education;Therapeutic activities    OT Goals(Current goals can be found in the care plan section) Acute Rehab OT Goals Patient Stated Goal: less pain. wounds to heal OT Goal Formulation: With patient Time For Goal Achievement: 07/18/17 Potential to Achieve Goals: Good ADL Goals Pt Will Perform Lower Body Bathing: with supervision;bed level;with  adaptive equipment (to standing) Pt Will Perform Lower Body Dressing: with supervision;with adaptive equipment;bed level (to standing) Pt Will Transfer to Toilet: with supervision;ambulating;bedside commode;regular height toilet (vs) Additional ADL Goal #1: pt will perform UB adls and grooming with supervision, standing  OT Frequency: Min 2X/week   Barriers to D/C:            Co-evaluation              AM-PAC PT "6 Clicks" Daily Activity     Outcome Measure Help from another person eating meals?: A Little Help from another person taking care of personal grooming?: A Little Help from another person toileting, which includes using toliet, bedpan, or urinal?: A Little Help from another person bathing (including washing, rinsing, drying)?: A Little Help from another person to put on and taking off regular upper body clothing?: A Little Help from another person to put on and taking off regular lower body clothing?: A Lot 6 Click Score: 17   End of Session    Activity Tolerance: Patient limited by fatigue;Patient limited by pain Patient left: in bed;with call bell/phone within reach  OT Visit Diagnosis: Unsteadiness on feet (R26.81)                Time: 1062-6948 OT Time Calculation (min): 17 min Charges:  OT General Charges $OT Visit: 1 Visit OT Evaluation $OT Eval Low Complexity: 1 Low G-Codes:     Galliano, OTR/L [phone] 07/11/2017  Sherry Baker 07/11/2017, 12:25 PM

## 2017-07-11 NOTE — Progress Notes (Signed)
   Subjective: Pain better  Objective: Vital signs in last 24 hours: Temp:  [98 F (36.7 C)-99.5 F (37.5 C)] 99.5 F (37.5 C) (09/08 0509) Pulse Rate:  [85-102] 91 (09/08 0509) Resp:  [16] 16 (09/08 0509) BP: (118-151)/(64-96) 135/84 (09/08 0509) SpO2:  [99 %-100 %] 100 % (09/08 0509)    Intake/Output from previous day: 09/07 0701 - 09/08 0700 In: 1905 [P.O.:120; I.V.:1785] Out: -  Intake/Output this shift: No intake/output data recorded.  General appearance: alert and cooperative GI: soft Incision/Wound: large open perineal wound, recently packed  Lab Results:  Results for orders placed or performed during the hospital encounter of 07/09/17 (from the past 24 hour(s))  Basic metabolic panel     Status: Abnormal   Collection Time: 07/10/17 11:39 AM  Result Value Ref Range   Sodium 132 (L) 135 - 145 mmol/L   Potassium 3.5 3.5 - 5.1 mmol/L   Chloride 97 (L) 101 - 111 mmol/L   CO2 27 22 - 32 mmol/L   Glucose, Bld 98 65 - 99 mg/dL   BUN 8 6 - 20 mg/dL   Creatinine, Ser 0.46 0.44 - 1.00 mg/dL   Calcium 8.1 (L) 8.9 - 10.3 mg/dL   GFR calc non Af Amer >60 >60 mL/min   GFR calc Af Amer >60 >60 mL/min   Anion gap 8 5 - 15     Studies/Results Radiology     MEDS, Scheduled . carvedilol  6.25 mg Oral BID WC  . enoxaparin (LOVENOX) injection  30 mg Subcutaneous Q24H  . feeding supplement (ENSURE ENLIVE)  237 mL Oral BID BM  . FLUoxetine  20 mg Oral Daily  . multivitamin with minerals  1 tablet Oral Daily  . pantoprazole  40 mg Oral Daily  . sodium hypochlorite  1 application Irrigation [id number]     Assessment:  Cocaine/etoh abuse Noncompliance Perineal wound breakdown Malnutrition  Plan: Wound RN eval: rec'd Dakins x 3 days.  Agree.  Will reassess wound after that Social Work to plan for [facility] placement.  Pt also needs drug rehab.  She is not able to care for herself at home. Ensure supplements for severe malnutrition    LOS: 1 day    Rosario Adie,  [facility], [place]   07/11/2017 7:29 AM

## 2017-07-11 NOTE — Evaluation (Signed)
Physical Therapy Evaluation Patient Details Name: Sherry Baker MRN: [id number] DOB: 09/07/1966 Today's Date: 07/11/2017   History of Present Illness  51 yo female admitted with perineum wound dehiscence, separation of midline abdominal wound, fall at home. Hx of rectal cancer, abd/perineal resection 8/16, drug and ETOH abuse    Clinical Impression  On eval, pt required Min assist for mobility. She walked ~60 feet while pushing IV pole for support. Pain rated 7/10 with activity. Pt has abdominal and perineal wounds that are painful. She participated well with therapy. Pt presents with general weakness, decreased activity tolerance, and impaired gait and balance. Discussed d/c plan-pt is agreeable to SNF placement.     Follow Up Recommendations SNF    Equipment Recommendations   (continuing to assess)    Recommendations for Other Services       Precautions / Restrictions Precautions Precautions: Fall Precaution Comments: abdominal and perineal wounds Restrictions Weight Bearing Restrictions: No      Mobility  Bed Mobility Overal bed mobility: Modified Independent             General bed mobility comments: pt performs task in her own manner to limit discomfort.   Transfers Overall transfer level: Needs assistance   Transfers: Sit to/from Stand Sit to Stand: Min guard         General transfer comment: close guard for safety.   Ambulation/Gait Ambulation/Gait assistance: Min assist Ambulation Distance (Feet): 60 Feet Assistive device:  (IV pole) Gait Pattern/deviations: Step-through pattern;Decreased stride length     General Gait Details: Assist to stabilize throughout distance. Pt also relied on IV pole.   Stairs            Wheelchair Mobility    Modified Rankin (Stroke Patients Only)       Balance Overall balance assessment: Needs assistance;History of Falls           Standing balance-Leahy Scale: Poor                                Pertinent Vitals/Pain Pain Assessment: 0-10 Pain Score: 7  Pain Location: abdominal, perineal areas Pain Descriptors / Indicators: Sore Pain Intervention(s): Limited activity within patient's tolerance    Home Living Family/patient expects to be discharged to:: Skilled nursing facility Living Arrangements: Non-relatives/Friends Available Help at Discharge:  (hasn't had help)             Additional Comments: has standard commode and tub     Prior Function Level of Independence: Independent               Hand Dominance        Extremity/Trunk Assessment   Upper Extremity Assessment Upper Extremity Assessment: Defer to OT evaluation    Lower Extremity Assessment Lower Extremity Assessment: Generalized weakness    Cervical / Trunk Assessment Cervical / Trunk Assessment: Normal  Communication   Communication: No difficulties  Cognition Arousal/Alertness: Awake/alert Behavior During Therapy: WFL for tasks assessed/performed Overall Cognitive Status: Within Functional Limits for tasks assessed                                        General Comments      Exercises     Assessment/Plan    PT Assessment Patient needs continued PT services  PT Problem List Decreased strength;Decreased mobility;Decreased balance;Pain;Decreased activity tolerance  PT Treatment Interventions Gait training;Therapeutic activities;Therapeutic exercise;Patient/family education;Functional mobility training;Balance training    PT Goals (Current goals can be found in the Care Plan section)  Acute Rehab PT Goals Patient Stated Goal: less pain. wounds to heal PT Goal Formulation: With patient Time For Goal Achievement: 07/25/17 Potential to Achieve Goals: Good    Frequency Min 2X/week   Barriers to discharge        Co-evaluation               AM-PAC PT "6 Clicks" Daily Activity  Outcome Measure Difficulty turning over in bed (including  adjusting bedclothes, sheets and blankets)?: None Difficulty moving from lying on back to sitting on the side of the bed? : None Difficulty sitting down on and standing up from a chair with arms (e.g., wheelchair, bedside commode, etc,.)?: A Little Help needed moving to and from a bed to chair (including a wheelchair)?: A Little Help needed walking in hospital room?: A Little Help needed climbing 3-5 steps with a railing? : A Little 6 Click Score: 20    End of Session   Activity Tolerance: Patient limited by pain Patient left: in bed;with call bell/phone within reach;with bed alarm set   PT Visit Diagnosis: Muscle weakness (generalized) (M62.81);Difficulty in walking, not elsewhere classified (R26.2);Pain Pain - part of body:  (abdominal, perineal areas)    Time: 1101-1109 PT Time Calculation (min) (ACUTE ONLY): 8 min   Charges:   PT Evaluation $PT Eval Low Complexity: 1 Low     PT G Codes:          Weston Anna, MPT Pager: [phone]

## 2017-07-12 LAB — BASIC METABOLIC PANEL
Anion gap: 7 (ref 5–15)
CALCIUM: 8 mg/dL — AB (ref 8.9–10.3)
CO2: 29 mmol/L (ref 22–32)
Chloride: 98 mmol/L — ABNORMAL LOW (ref 101–111)
GLUCOSE: 115 mg/dL — AB (ref 65–99)
Potassium: 3.1 mmol/L — ABNORMAL LOW (ref 3.5–5.1)
Sodium: 134 mmol/L — ABNORMAL LOW (ref 135–145)

## 2017-07-12 MED ORDER — POTASSIUM CHLORIDE CRYS ER 20 MEQ PO TBCR
20.0000 meq | EXTENDED_RELEASE_TABLET | Freq: Two times a day (BID) | ORAL | Status: DC
Start: 2017-07-12 — End: 2017-07-13
  Administered 2017-07-12 – 2017-07-13 (×3): 20 meq via ORAL
  Filled 2017-07-12 (×3): qty 1

## 2017-07-12 NOTE — Progress Notes (Signed)
   Subjective: Pain better, dressing changes going well  Objective: Vital signs in last 24 hours: Temp:  [97.8 F (36.6 C)-99.6 F (37.6 C)] 97.8 F (36.6 C) (09/09 0539) Pulse Rate:  [79-94] 84 (09/09 0539) Resp:  [18] 18 (09/09 0539) BP: (108-165)/(66-88) 165/88 (09/09 0539) SpO2:  [98 %-100 %] 100 % (09/09 0539) Last BM Date: 07/12/17  Intake/Output from previous day: 09/08 0701 - 09/09 0700 In: 2290 [P.O.:510; I.V.:1780] Out: 5 [Urine:5] Intake/Output this shift: No intake/output data recorded.  General appearance: alert and cooperative GI: soft Incision/Wound: large open perineal wound, packed  Lab Results:  Results for orders placed or performed during the hospital encounter of 07/09/17 (from the past 24 hour(s))  Basic metabolic panel     Status: Abnormal   Collection Time: 07/12/17  5:55 AM  Result Value Ref Range   Sodium 134 (L) 135 - 145 mmol/L   Potassium 3.1 (L) 3.5 - 5.1 mmol/L   Chloride 98 (L) 101 - 111 mmol/L   CO2 29 22 - 32 mmol/L   Glucose, Bld 115 (H) 65 - 99 mg/dL   BUN <5 (L) 6 - 20 mg/dL   Creatinine, Ser <0.30 (L) 0.44 - 1.00 mg/dL   Calcium 8.0 (L) 8.9 - 10.3 mg/dL   GFR calc non Af Amer NOT CALCULATED >60 mL/min   GFR calc Af Amer NOT CALCULATED >60 mL/min   Anion gap 7 5 - 15     Studies/Results Radiology     MEDS, Scheduled . carvedilol  6.25 mg Oral BID WC  . enoxaparin (LOVENOX) injection  40 mg Subcutaneous Q24H  . feeding supplement (ENSURE ENLIVE)  237 mL Oral BID BM  . FLUoxetine  20 mg Oral Daily  . multivitamin with minerals  1 tablet Oral Daily  . pantoprazole  40 mg Oral Daily  . sodium hypochlorite  1 application Irrigation [id number]     Assessment:  Cocaine/etoh abuse Noncompliance Perineal wound breakdown Malnutrition  Plan: Wound RN eval: rec'd Dakins x 3 days.  Agree.  Will reassess wound tom am Social Work to plan for ECF placement.  Pt also needs drug rehab.  She is not able to care for herself at  home. Ensure supplements for severe malnutrition  Hypokalemia: cont replacements  LOS: 2 days    Rosario Adie, MD [facility], [place] [phone]   07/12/2017 7:52 AM

## 2017-07-13 LAB — PREALBUMIN: Prealbumin: 6.8 mg/dL — ABNORMAL LOW (ref 18–38)

## 2017-07-13 MED ORDER — POTASSIUM CHLORIDE CRYS ER 20 MEQ PO TBCR
30.0000 meq | EXTENDED_RELEASE_TABLET | Freq: Two times a day (BID) | ORAL | Status: DC
  Administered 2017-07-13: 21:00:00 30 meq via ORAL
  Filled 2017-07-13: qty 1

## 2017-07-13 NOTE — NC FL2 (Signed)
[place] LEVEL OF CARE SCREENING TOOL     IDENTIFICATION  Patient Name: Sherry Baker Birthdate: 10/05/1966 Sex: female Admission Date (Current Location): 07/09/2017  [facility] [place] Number:  Herbalist and Address:  [facility],  [place] [address], [place]      Provider Number: [id number]  Attending Physician Name and Address:  Leighton Ruff, MD  Relative Name and Phone Number:       Current Level of Care:   Recommended Level of Care: [place] Prior Approval Number:    Date Approved/Denied:   PASRR Number:    Discharge Plan: SNF    Current Diagnoses: Patient Active Problem List   Diagnosis Date Noted  . Wound dehiscence 07/10/2017  . Genetic testing 03/24/2017  . Hypertension 02/18/2017  . GERD (gastroesophageal reflux disease) 02/18/2017  . Rectal cancer ([place]) 01/27/2017  . Abdominal pain, epigastric   . Rectal mass   . Fever   . Anemia 01/16/2017  . Abdominal pain   . Constipation   . Alcohol abuse   . Tobacco abuse   . Cocaine abuse   . Bipolar affective disorder ([place])   . Protein-calorie malnutrition, severe ([place]) 02/13/2014  . Suicide ideation 02/12/2014  . Suicidal ideation 02/11/2014  . Alcohol intoxication ([place]) 02/11/2014  . Transaminitis 02/11/2014  . Substance abuse 02/11/2014  . Depression 02/11/2014  . Thrombocytopenia ([place]) 02/11/2014    Orientation RESPIRATION BLADDER Height & Weight     Self, Time, Situation, Place  Normal Continent Weight: 108 lb 8 oz (49.2 kg) Height:  5\' 2"  (157.5 cm)  BEHAVIORAL SYMPTOMS/MOOD NEUROLOGICAL BOWEL NUTRITION STATUS      Continent Diet (Regular)  AMBULATORY STATUS COMMUNICATION OF NEEDS Skin   Limited Assist Verbally PU Stage and Appropriate Care (Buttock-Measurement:Perineum: 8 cm x 6 cm x 4.3 cmDressing procedure/placement/frequency:Cleanse perineal wound with NS.  Gently fill wound defect with NS moist kerlix.  Cover with ABD Pads and  tape. Change daily.  )                       Personal Care Assistance Level of Assistance  Bathing, Feeding, Dressing Bathing Assistance: Limited assistance Feeding assistance: Independent Dressing Assistance: Limited assistance     Functional Limitations Info  Sight, Hearing, Speech Sight Info: Adequate Hearing Info: Adequate Speech Info: Adequate    SPECIAL CARE FACTORS FREQUENCY  PT (By licensed PT), OT (By licensed OT)     PT Frequency: Min 2X/week OT Frequency: Min 2X/week            Contractures Contractures Info: Not present    Additional Factors Info  Code Status, Allergies Code Status Info: Fullcode Allergies Info: Penicillins           Current Medications (07/13/2017):  This is the current hospital active medication list Current Facility-Administered Medications  Medication Dose Route Frequency Provider Last Rate Last Dose  . albuterol (PROVENTIL) (2.5 MG/3ML) 0.083% nebulizer solution 2.5 mg  2.5 mg Nebulization Q6H PRN Jackolyn Confer, MD      . carvedilol (COREG) tablet 6.25 mg  6.25 mg Oral BID WC Jackolyn Confer, MD   6.25 mg at 07/13/17 0807  . dextrose 5 % and 0.45 % NaCl with KCl 30 mEq/L infusion   Intravenous Continuous Armandina Gemma, MD 75 mL/hr at 07/12/17 0831    . enoxaparin (LOVENOX) injection 40 mg  40 mg Subcutaneous P38S Leighton Ruff, MD   40 mg at 07/13/17 5053  .  feeding supplement (ENSURE ENLIVE) (ENSURE ENLIVE) liquid 237 mL  237 mL Oral BID BM Leighton Ruff, MD   683 mL at 07/13/17 0934  . FLUoxetine (PROZAC) capsule 20 mg  20 mg Oral Daily Jackolyn Confer, MD   20 mg at 07/13/17 0807  . multivitamin with minerals tablet 1 tablet  1 tablet Oral Daily Leighton Ruff, MD   1 tablet at 07/13/17 [phone]  . oxyCODONE (Oxy IR/ROXICODONE) immediate release tablet 5-10 mg  5-10 mg Oral Q6H PRN Jackolyn Confer, MD   10 mg at 07/13/17 0933  . pantoprazole (PROTONIX) EC tablet 40 mg  40 mg Oral Daily Jackolyn Confer, MD   40 mg at  07/13/17 0807  . potassium chloride (K-DUR,KLOR-CON) CR tablet 30 mEq  30 mEq Oral BID Leighton Ruff, MD         Discharge Medications: Please see discharge summary for a list of discharge medications.  Relevant Imaging Results:  Relevant Lab Results:   Additional Information ssn:[id number]  Lia Hopping, LCSW

## 2017-07-13 NOTE — Consult Note (Signed)
[place] Nurse wound follow up Reason for Consult: Dehiscence of perineal wound, foul odor on admission.  Has had three days of Dakin's moist gauze and odor is improved.   Separation of midline abdominal wound, superficial. Albumin 2.4, current drug use and self neglect.  Has been caring for the ostomy at home without any trouble  1 piece pouches left at bedside.  LLQ colostomy care Wound type: Dehisced surgical wounds.  Perineal wound with deep defect, unable to visualize wound bed.  Will begin NS moist gauze today.   Needs nutritional support and rehab for her drug addiction.  Pressure Injury POA:  NA Measurement:Perineum:  8 cm x 6 cm x 4.3 cm  Midline incision:  5 cm x 0.5 cm with thin adherent slough to wound bed.  NS moist gauze to this wound daily.  Wound bed: Perineum Dark Drainage (amount, consistency, odor) FOUL Necrotic odor  Gray, thin drainage. None from midline wound Periwound:intact Dressing procedure/placement/frequency:Cleanse perineal wound with NS.  Gently fill wound defect with NS moist kerlix.    Cover with ABD Pads and tape. Change daily.  Will not follow at this time.  Please re-consult if needed.  Domenic Moras RN BSN [place] Pager [phone]

## 2017-07-13 NOTE — Progress Notes (Signed)
Patient up to bathroom and is bleeding bright, red blood from rectal area wound and vagina and clots present in both areas. Patient has soaked through ABD pads x2 two different times and was leaking blood onto floor. Patient was cleaned up, clots were noted in the rectal wound and a clot like appearance protruding from vagina that did not clear from wiping patient. Patient denies any new symptoms other than peri area pressure that has been going on for few days. Patient rectal wound was re-packed per previous orders and placed maxi pad and mess panties. Will page on call provider for CCS to notify. Will c/t monitor. Advised patient to call for assistance to get OOB.

## 2017-07-13 NOTE — Clinical Social Work Note (Signed)
Clinical Social Work Assessment  Patient Details  Name: Sherry Baker MRN: [id number] Date of Birth: 11/29/1965  Date of referral:  07/13/17               Reason for consult:  Facility Placement                Permission sought to share information with:  Facility Art therapist granted to share information::  Yes, Verbal Permission Granted  Name::        Agency::     Relationship::     Contact Information:     Housing/Transportation Living arrangements for the past 2 months:  Apartment Source of Information:  Patient Patient Interpreter Needed:  None Criminal Activity/Legal Involvement Pertinent to Current Situation/Hospitalization:  No  Significant Relationships:  Adult Children Lives with:  Roommate Do you feel safe going back to the place where you live?  No Need for family participation in patient care:  Yes  Care giving concerns:  Patient has Dehiscence of perineal wound, foul odor on admission. Patient has been recommended for SNF placement for wound care.  Patient does not have any insurance.   Social Worker assessment / plan: CSW met with patient at bedside explain role and reason for visit, to discuss SNF placement and options. Patient reports she lives with a roommate that tries to help with her wounds care. Patient has not been able to properly care for wound at home. Patient is agreeable to go to nursing home for wound care.  CSW discussed w/ Surveyor, quantity of social work, they are willing to do a thirty-day letter of guarantee for placement.  CSW to complete FL2, PASSR  Plan: Assist with locating a SNF that will admit patient w/ LOG   Employment status:  Unemployed Insurance information:  Self Pay (Medicaid Pending) PT Recommendations:    Information / Referral to community resources:  [place]  Patient/Family's Response to care:  Agreeable and Responding well to care.   Patient/Family's Understanding of and Emotional  Response to Diagnosis, Current Treatment, and Prognosis: " The startes, "I thought I could care for myself at home but I need more help." Patient understands she needs higher level of support and care for her wounds.     Emotional Assessment Appearance:  Developmentally appropriate Attitude/Demeanor/Rapport:    Affect (typically observed):  Accepting, Pleasant Orientation:  Oriented to Self, Oriented to Place, Oriented to  Time, Oriented to Situation Alcohol / Substance use:  Illicit Drugs, Alcohol Use Psych involvement (Current and /or in the community):  No   Discharge Needs  Concerns to be addressed:  Discharge Planning Concerns, Care Coordination, Lack of Support Readmission within the last 30 days:  Yes Current discharge risk:  None Barriers to Discharge:  Continued Medical Work up, No SNF bed, Other (30 Day LOG),  Substance Use.    Lia Hopping, LCSW 07/13/2017, 2:05 PM

## 2017-07-13 NOTE — Care Management Note (Signed)
Case Management Note  Patient Details  Name: Sherry Baker MRN: [id number] Date of Birth: 08-28-1966  Subjective/Objective:                  Wound RN eval: cont gentle wet to dry dressing changes.  Pt not really interested in eventual vaginal reconstruction Social Work to plan for ECF placement upon d/c.  Pt also needs drug rehab.  She is not able to care for herself at home. Ensure supplements for severe malnutrition, will check nutrition labs Hypokalemia: increase replacements  Action/Plan: Date:  July 13, 2017 Chart reviewed for concurrent status and case management needs.  Will continue to follow patient progress.  Discharge Planning: following for needs  Expected discharge date: [id number]  [name], BSN, [place], [place]   Expected Discharge Date:                  Expected Discharge Plan:  [facility]  In-House Referral:     Discharge planning Services  CM Consult  Post Acute Care Choice:  Resumption of Svcs/PTA Provider Choice offered to:     DME Arranged:    DME Agency:     HH Arranged:    [place] Agency:     Status of Service:  In process, will continue to follow  If discussed at Long Length of Stay Meetings, dates discussed:    Additional Comments:  Leeroy Cha, RN 07/13/2017, 9:55 AM

## 2017-07-13 NOTE — Progress Notes (Addendum)
Attempted to place tampon per MD order. Patient unable to tolerate insertion even with lubrication and asked nurse to stop. Patient has mesh briefs in place with maxi pad. Bleeding seems to have slowed, dressings are dry and intact. Will notify day nurse and MD. Will c/t monitor.

## 2017-07-13 NOTE — Progress Notes (Signed)
Paged CCS and spoke with provider on call Dr. Marcello Moores. Advised that patient is bleeding bright, red blood from rectal area wound and vagina and clots present in both areas. Advised that patient has soaked through ABD pads x2 two different times and was leaking blood onto floor. Advised that once patient was cleaned up, clots were noted in the rectal wound and a clot like appearance protruding from vagina that did not clear from wiping patient. Advised that patient denies any new symptoms other than peri area pressure that has been going on for few days. Advised that patient rectal wound was re-packed per previous orders and placed maxi pad and mess panties. Advised by Dr. Marcello Moores to clear as much of the clotting away and insert tampon into vagina to help with controlling bleeding and provide pressure to area. Verbal order read back to insert tampon vaginally. Will c/t monitor.

## 2017-07-13 NOTE — Progress Notes (Signed)
   Subjective: Had some bleeding overnight.  Resolved with packing.    Objective: Vital signs in last 24 hours: Temp:  [98.5 F (36.9 C)-99.7 F (37.6 C)] 98.9 F (37.2 C) (09/10 0521) Pulse Rate:  [79-89] 79 (09/10 0521) Resp:  [18-20] 18 (09/10 0521) BP: (118-166)/(73-97) 118/73 (09/10 0521) SpO2:  [98 %-100 %] 98 % (09/10 0521) Last BM Date: 07/13/17  Intake/Output from previous day: 09/09 0701 - 09/10 0700 In: 2550 [P.O.:780; I.V.:1770] Out: 129 [Urine:4; Stool:125] Intake/Output this shift: No intake/output data recorded.  General appearance: alert and cooperative GI: soft Incision/Wound: large open perineal wound, necrosis of posterior vaginal wall noted  Lab Results:  No results found for this or any previous visit (from the past 24 hour(s)).   Studies/Results Radiology     MEDS, Scheduled . carvedilol  6.25 mg Oral BID WC  . enoxaparin (LOVENOX) injection  40 mg Subcutaneous Q24H  . feeding supplement (ENSURE ENLIVE)  237 mL Oral BID BM  . FLUoxetine  20 mg Oral Daily  . multivitamin with minerals  1 tablet Oral Daily  . pantoprazole  40 mg Oral Daily  . potassium chloride  30 mEq Oral BID     Assessment:  Cocaine/etoh abuse Noncompliance Perineal wound breakdown Malnutrition  Plan: Wound RN eval: cont gentle wet to dry dressing changes.  Pt not really interested in eventual vaginal reconstruction Social Work to plan for ECF placement upon d/c.  Pt also needs drug rehab.  She is not able to care for herself at home. Ensure supplements for severe malnutrition, will check nutrition labs Hypokalemia: increase replacements  LOS: 3 days    Rosario Adie, MD [facility], [place] [phone]   07/13/2017 8:29 AM

## 2017-07-14 LAB — COMPREHENSIVE METABOLIC PANEL
ALBUMIN: 1.9 g/dL — AB (ref 3.5–5.0)
ALT: 13 U/L — ABNORMAL LOW (ref 14–54)
AST: 17 U/L (ref 15–41)
Alkaline Phosphatase: 45 U/L (ref 38–126)
Anion gap: 7 (ref 5–15)
BILIRUBIN TOTAL: 0.2 mg/dL — AB (ref 0.3–1.2)
BUN: 6 mg/dL (ref 6–20)
CHLORIDE: 98 mmol/L — AB (ref 101–111)
CO2: 26 mmol/L (ref 22–32)
Calcium: 8.2 mg/dL — ABNORMAL LOW (ref 8.9–10.3)
Creatinine, Ser: 0.32 mg/dL — ABNORMAL LOW (ref 0.44–1.00)
GFR calc Af Amer: >60 mL/min (ref >60)
GFR calc non Af Amer: >60 mL/min (ref >60)
GLUCOSE: 100 mg/dL — AB (ref 65–99)
POTASSIUM: 4.2 mmol/L (ref 3.5–5.1)
Sodium: 131 mmol/L — ABNORMAL LOW (ref 135–145)
Total Protein: 5.3 g/dL — ABNORMAL LOW (ref 6.5–8.1)

## 2017-07-14 LAB — CBC
HEMATOCRIT: 19.9 % — AB (ref 36.0–46.0)
Hemoglobin: 6.6 g/dL — CL (ref 12.0–15.0)
MCH: 28.4 pg (ref 26.0–34.0)
MCHC: 33.2 g/dL (ref 30.0–36.0)
MCV: 85.8 fL (ref 78.0–100.0)
PLATELETS: 151 10*3/uL (ref 150–400)
RBC: 2.32 MIL/uL — AB (ref 3.87–5.11)
RDW: 16.8 % — AB (ref 11.5–15.5)
WBC: 5.5 10*3/uL (ref 4.0–10.5)

## 2017-07-14 LAB — PREPARE RBC (CROSSMATCH)

## 2017-07-14 LAB — ABO/RH: ABO/RH(D): O POS

## 2017-07-14 LAB — MAGNESIUM: Magnesium: 1.4 mg/dL — ABNORMAL LOW (ref 1.7–2.4)

## 2017-07-14 MED ORDER — FERROUS SULFATE 325 (65 FE) MG PO TABS
325.0000 mg | ORAL_TABLET | Freq: Two times a day (BID) | ORAL | Status: DC
  Administered 2017-07-14 – 2017-07-21 (×15): 325 mg via ORAL
  Filled 2017-07-14 (×15): qty 1

## 2017-07-14 MED ORDER — SODIUM CHLORIDE 0.9 % IV SOLN: Freq: Once | INTRAVENOUS | Status: DC

## 2017-07-14 MED ORDER — DOCUSATE SODIUM 100 MG PO CAPS
100.0000 mg | ORAL_CAPSULE | Freq: Two times a day (BID) | ORAL | Status: DC
  Administered 2017-07-14 – 2017-07-21 (×15): 100 mg via ORAL
  Filled 2017-07-14 (×15): qty 1

## 2017-07-14 MED ORDER — POTASSIUM CHLORIDE CRYS ER 20 MEQ PO TBCR
20.0000 meq | EXTENDED_RELEASE_TABLET | Freq: Two times a day (BID) | ORAL | Status: DC
  Administered 2017-07-14 – 2017-07-21 (×15): 20 meq via ORAL
  Filled 2017-07-14 (×15): qty 1

## 2017-07-14 MED ORDER — OXYCODONE HCL 5 MG PO TABS
5.0000 mg | ORAL_TABLET | ORAL | Status: DC | PRN
  Administered 2017-07-14 – 2017-07-16 (×12): 5 mg via ORAL
  Filled 2017-07-14 (×12): qty 1

## 2017-07-14 MED ORDER — ENSURE ENLIVE PO LIQD
237.0000 mL | Freq: Three times a day (TID) | ORAL | Status: DC
  Administered 2017-07-14 – 2017-07-20 (×18): 237 mL via ORAL

## 2017-07-14 MED ORDER — NICOTINE 7 MG/24HR TD PT24
7.0000 mg | MEDICATED_PATCH | Freq: Every day | TRANSDERMAL | Status: DC
  Administered 2017-07-15: 7 mg via TRANSDERMAL
  Filled 2017-07-14 (×7): qty 1

## 2017-07-14 NOTE — Progress Notes (Signed)
Physical Therapy Treatment Patient Details Name: Sherry Baker MRN: [id number] DOB: [birth year] Today's Date: 07/14/2017    History of Present Illness 51 yo female admitted with perineum wound dehiscence, separation of midline abdominal wound, fall at home. Hx of rectal cancer, abd/perineal resection 8/16, drug and ETOH abuse    PT Comments    Progressing well with mobility. Pt continues to c/o pain at wound sites. She is very motivated to increase mobility/activity. Will continue to follow.    Follow Up Recommendations  SNF     Equipment Recommendations  None recommended by PT    Recommendations for Other Services       Precautions / Restrictions Precautions Precautions: Fall Precaution Comments: abdominal and perineal wounds Restrictions Weight Bearing Restrictions: No    Mobility  Bed Mobility Overal bed mobility: Modified Independent                Transfers   Equipment used: None Transfers: Sit to/from Stand Sit to Stand: Supervision         General transfer comment: for safety  Ambulation/Gait Ambulation/Gait assistance: Min guard Ambulation Distance (Feet): 350 Feet Assistive device: None Gait Pattern/deviations: Wide base of support;Step-through pattern     General Gait Details: close guard for safety.    Stairs            Wheelchair Mobility    Modified Rankin (Stroke Patients Only)       Balance             Standing balance-Leahy Scale: Fair                              Cognition Arousal/Alertness: Awake/alert Behavior During Therapy: WFL for tasks assessed/performed Overall Cognitive Status: Within Functional Limits for tasks assessed                                        Exercises      General Comments        Pertinent Vitals/Pain Pain Assessment: 0-10 Pain Score: 7  Faces Pain Scale: Hurts little more Pain Location: abdominal, perineal areas Pain Descriptors / Indicators:  Sore Pain Intervention(s): Monitored during session    Home Living                      Prior Function            PT Goals (current goals can now be found in the care plan section) Progress towards PT goals: Progressing toward goals    Frequency    Min 2X/week      PT Plan Current plan remains appropriate    Co-evaluation              AM-PAC PT "6 Clicks" Daily Activity  Outcome Measure  Difficulty turning over in bed (including adjusting bedclothes, sheets and blankets)?: None Difficulty moving from lying on back to sitting on the side of the bed? : None Difficulty sitting down on and standing up from a chair with arms (e.g., wheelchair, bedside commode, etc,.)?: None Help needed moving to and from a bed to chair (including a wheelchair)?: None Help needed walking in hospital room?: A Little Help needed climbing 3-5 steps with a railing? : A Little 6 Click Score: 22    End of Session Equipment Utilized During Treatment: Gait belt Activity Tolerance: Patient  tolerated treatment well Patient left: with call bell/phone within reach (in room )   PT Visit Diagnosis: Muscle weakness (generalized) (M62.81);Difficulty in walking, not elsewhere classified (R26.2);Pain Pain - part of body:  (abdominal, perineal areas)     Time: 1459-1510 PT Time Calculation (min) (ACUTE ONLY): 11 min  Charges:  $Gait Training: 8-22 mins                    G Codes:         Weston Anna, MPT Pager: [phone]

## 2017-07-14 NOTE — Progress Notes (Signed)
Wound care provided for patient. Packing and ABD pad removed and the wound was cleansed with normal saline and wet-to-dry packing inserted. Wound covered with ABD pad.

## 2017-07-14 NOTE — Progress Notes (Signed)
Occupational Therapy Treatment Patient Details Name: Sherry Baker MRN: [id number] DOB: [birth year] Today's Date: 07/14/2017    History of present illness 51 yo female admitted with perineum wound dehiscence, separation of midline abdominal wound, fall at home. Hx of rectal cancer, abd/perineal resection 8/16, drug and ETOH abuse   OT comments  Issued AE and pt is able to use this/complete adls with set up/supervision.  She reports she has been getting up to bathroom and I observed her straightening sheets. No further needs in acute setting  Follow Up Recommendations  SNF    Equipment Recommendations    none by OT   Recommendations for Other Services      Precautions / Restrictions Precautions Precautions: Fall Precaution Comments: abdominal and perineal wounds Restrictions Weight Bearing Restrictions: No       Mobility Bed Mobility Overal bed mobility: Modified Independent                Transfers   Equipment used: None   Sit to Stand: Supervision              Balance             Standing balance-Leahy Scale: Fair                             ADL either performed or assessed with clinical judgement   ADL               Lower Body Bathing: Supervison/ safety;Sit to/from stand;With adaptive equipment       Lower Body Dressing: Supervision/safety;Sit to/from stand;With adaptive equipment                 General ADL Comments: pt got OOB and straightened bed. She reports she has been able to get up to bathroom without difficulty.  Issued AE and pt practiced with reacher and sock aide from bed level (to standing).  She is excited about the reacher and was able to retrieve items with this.     Vision       Perception     Praxis      Cognition Arousal/Alertness: Awake/alert Behavior During Therapy: WFL for tasks assessed/performed Overall Cognitive Status: Within Functional Limits for tasks assessed                                           Exercises     Shoulder Instructions       General Comments      Pertinent Vitals/ Pain       Faces Pain Scale: Hurts little more Pain Location: abdominal, perineal areas Pain Descriptors / Indicators: Sore Pain Intervention(s): Limited activity within patient's tolerance;Monitored during session;Repositioned  Home Living                                          Prior Functioning/Environment              Frequency           Progress Toward Goals  OT Goals(current goals can now be found in the care plan section)  Progress towards OT goals: Goals met/education completed, patient discharged from [facility]  AM-PAC PT "6 Clicks" Daily Activity     Outcome Measure   Help from another person eating meals?: None Help from another person taking care of personal grooming?: A Little Help from another person toileting, which includes using toliet, bedpan, or urinal?: A Little Help from another person bathing (including washing, rinsing, drying)?: A Little Help from another person to put on and taking off regular upper body clothing?: A Little Help from another person to put on and taking off regular lower body clothing?: A Little 6 Click Score: 19    End of Session    OT Visit Diagnosis: Unsteadiness on feet (R26.81)   Activity Tolerance Patient tolerated treatment well   Patient Left in bed;with call bell/phone within reach   Nurse Communication          Time: 0626-9485 OT Time Calculation (min): 9 min  Charges: OT General Charges $OT Visit: 1 Visit OT Treatments $Self Care/Home Management : 8-22 mins  Lesle Chris, OTR/L [phone] 07/14/2017   Sherry Baker 07/14/2017, 2:37 PM

## 2017-07-14 NOTE — Progress Notes (Addendum)
Subjective: No further bleeding.    Objective: Vital signs in last 24 hours: Temp:  [97 F (36.1 C)-98.8 F (37.1 C)] 97 F (36.1 C) (09/11 0637) Pulse Rate:  [80-87] 80 (09/11 0637) Resp:  [18] 18 (09/11 0637) BP: (97-140)/(63-85) 97/75 (09/11 0831) SpO2:  [97 %-100 %] 100 % (09/11 0637) Last BM Date: 07/14/17  Intake/Output from previous day: 09/10 0701 - 09/11 0700 In: 480 [P.O.:480] Out: -  Intake/Output this shift: No intake/output data recorded.  General appearance: alert and cooperative GI: soft Incision/Wound: large open perineal wound, necrosis of posterior vaginal wall noted  Lab Results:  Results for orders placed or performed during the hospital encounter of 07/09/17 (from the past 24 hour(s))  Prealbumin     Status: Abnormal   Collection Time: 07/13/17  9:03 AM  Result Value Ref Range   Prealbumin 6.8 (L) 18 - 38 mg/dL  CBC     Status: Abnormal   Collection Time: 07/14/17  5:35 AM  Result Value Ref Range   WBC 5.5 4.0 - 10.5 K/uL   RBC 2.32 (L) 3.87 - 5.11 MIL/uL   Hemoglobin 6.6 (LL) 12.0 - 15.0 g/dL   HCT 19.9 (L) 36.0 - 46.0 %   MCV 85.8 78.0 - 100.0 fL   MCH 28.4 26.0 - 34.0 pg   MCHC 33.2 30.0 - 36.0 g/dL   RDW 16.8 (H) 11.5 - 15.5 %   Platelets 151 150 - 400 K/uL  Comprehensive metabolic panel     Status: Abnormal   Collection Time: 07/14/17  5:35 AM  Result Value Ref Range   Sodium 131 (L) 135 - 145 mmol/L   Potassium 4.2 3.5 - 5.1 mmol/L   Chloride 98 (L) 101 - 111 mmol/L   CO2 26 22 - 32 mmol/L   Glucose, Bld 100 (H) 65 - 99 mg/dL   BUN 6 6 - 20 mg/dL   Creatinine, Ser 0.32 (L) 0.44 - 1.00 mg/dL   Calcium 8.2 (L) 8.9 - 10.3 mg/dL   Total Protein 5.3 (L) 6.5 - 8.1 g/dL   Albumin 1.9 (L) 3.5 - 5.0 g/dL   AST 17 15 - 41 U/L   ALT 13 (L) 14 - 54 U/L   Alkaline Phosphatase 45 38 - 126 U/L   Total Bilirubin 0.2 (L) 0.3 - 1.2 mg/dL   GFR calc non Af Amer >60 >60 mL/min   GFR calc Af Amer >60 >60 mL/min   Anion gap 7 5 - 15  Magnesium      Status: Abnormal   Collection Time: 07/14/17  5:35 AM  Result Value Ref Range   Magnesium 1.4 (L) 1.7 - 2.4 mg/dL     Studies/Results Radiology     MEDS, Scheduled . carvedilol  6.25 mg Oral BID WC  . enoxaparin (LOVENOX) injection  40 mg Subcutaneous Q24H  . feeding supplement (ENSURE ENLIVE)  237 mL Oral BID BM  . FLUoxetine  20 mg Oral Daily  . multivitamin with minerals  1 tablet Oral Daily  . pantoprazole  40 mg Oral Daily  . potassium chloride  30 mEq Oral BID     Assessment:  Cocaine/etoh abuse Noncompliance Perineal wound breakdown Malnutrition  Plan: Wound RN eval: cont gentle wet to dry dressing changes.  Pt not really interested in eventual vaginal reconstruction Social Work to plan for ECF placement upon d/c.  Pt also needs drug rehab.  She is not able to care for herself at home. Ensure supplements for severe malnutrition,  will check nutrition labs Hypokalemia: cont replacements, recheck in AM Acute blood loss anemia: transfuse 1 unit  LOS: 4 days    Rosario Adie, MD [facility], [place] [phone]   07/14/2017 8:40 AM

## 2017-07-14 NOTE — Progress Notes (Signed)
Smoke smell noted to room. Pt admits to smoking in room. Advised of safety concerns and campus policy. Patient stated she will not attempt to smoke in room again.

## 2017-07-14 NOTE — Progress Notes (Signed)
Nutrition Follow-up  DOCUMENTATION CODES:   Not applicable  INTERVENTION:   Ensure Enlive po TID, each supplement provides 350 kcal and 20 grams of protein  Provide MVI daily  Monitor and supplement electrolytes as need per MD descretion.   NUTRITION DIAGNOSIS:   Malnutrition (Severe) related to social / environmental circumstances, chronic illness (Polysubstance abuse, rectal cancer) as evidenced by percent weight loss, energy intake < or equal to 50% for > or equal to 1 month, moderate depletion of body fat, severe depletion of muscle mass.  Ongoing  GOAL:   Patient will meet greater than or equal to 90% of their needs  Progressing- 71% last 8 meals  MONITOR:   PO intake, Supplement acceptance, Labs, Weight trends  REASON FOR ASSESSMENT:   Consult Assessment of nutrition requirement/status  ASSESSMENT:   Pt with PMH of polysubstance abuse, cirrhosis, rectal cancer (currently undergoing chemotherapy and radiation), depression, and HTN. Pt recently admitted 8/16 for perineal resection, discharged 8/20. Presents this admission with c/o falling at home resulting in dehiscence wound in the perineum.    Pt reports appetite is increasing back to baseline, consuming an average of 71% of last 8 meals. Pt drinking Ensure Enlive twice per day. Would like to change Ensure for BID to TID given wound status. Wound being cared for by wound care nursing team. Pt awaiting SNF placement. Mg noted to be low, MD to to replace. Wt noted to be up 6 lb since admission. Will continue to encourage PO intake and monitor supplement acceptance.   Medications reviewed and include: ferrous sulfate, MVI with minerals, KCl, NaCl with KCl and D5 @ 10 ml/hr Labs reviewed: Na 131 (L) Cl 98 (L) Creatinine 0.32 (L) Mg 1.4 (L)    Diet Order:  Diet regular Room service appropriate? Yes; Fluid consistency: Thin  Skin:   (large open perineal wound, necrosis of posterior vaginal wall noted)  Last BM:   07/14/17  Height:   Ht Readings from Last 1 Encounters:  07/10/17 5\' 2"  (1.575 m)    Weight:   Wt Readings from Last 1 Encounters:  07/10/17 108 lb 8 oz (49.2 kg)    Ideal Body Weight:  50 kg  BMI:  Body mass index is 19.84 kg/m.  Estimated Nutritional Needs:   Kcal:  1550-1750 (32-36 kcal/kg)  Protein:  80-90 grams (1.6-1.8 g/kg)  Fluid:  >1.5 L/day  EDUCATION NEEDS:   Education needs addressed  [place], LDN Clinical Nutrition Pager # [phone]

## 2017-07-15 DIAGNOSIS — T8130XA Disruption of wound, unspecified, initial encounter: Secondary | ICD-10-CM

## 2017-07-15 DIAGNOSIS — D509 Iron deficiency anemia, unspecified: Secondary | ICD-10-CM

## 2017-07-15 DIAGNOSIS — C20 Malignant neoplasm of rectum: Secondary | ICD-10-CM

## 2017-07-15 LAB — TYPE AND SCREEN
ABO/RH(D): O POS
Antibody Screen: NEGATIVE
UNIT DIVISION: 0

## 2017-07-15 LAB — BPAM RBC
BLOOD PRODUCT EXPIRATION DATE: 201810122359
ISSUE DATE / TIME: 201809111017
UNIT TYPE AND RH: 5100

## 2017-07-15 MED ORDER — INFLUENZA VAC SPLIT QUAD 0.5 ML IM SUSY: 0.5000 mL | PREFILLED_SYRINGE | INTRAMUSCULAR | Status: DC | PRN

## 2017-07-15 NOTE — Progress Notes (Signed)
CSW assisting with dc planning to SNF. [facility] declined pt due to bed bugs. Please see note :  [place], Helane Gunther, RN Registered Nurse Signed   ED Triage Notes Date of Service: 07/10/2017 12:11 AM      [] Hide copied text [] Hover for attribution information Patient BIB [facility] from home due to bleeding from surgical site above rectum. Pt states the surgery was about one month ago for rectal CA. Patient noticed bleeding this evening around 2200. Patient admits to drinking a "40" alcohol and using cocaine around 2200. colostomy bag intact. EMS also noticed bed bug on stretcher when moving patient into room.      CSW has alerted pt's nsg.  CSW will continue to follow to assist with dc planning.  Werner Lean LCSW [phone]

## 2017-07-15 NOTE — Consult Note (Addendum)
[facility] Nurse wound follow up Wound type: perineal dehisced wound, abdominal midline inc with separation.  Measurement:see previous note Wound bed:see previous note Drainage (amount, consistency, odor) see previous note Periwound:see previous note Dressing procedure/placement/frequency: Bedside RN, [place], had just changed dressings 15 min prior to my visit.  She states they did sitz bath and then wet to dry dressing. No odor was noted in room, however pt had just showered and had a sitz bath and dressing change. Dr. Marcello Moores gave wound care orders this am, will continue with them. Will continue to follow this pt for ostomy needs.  Fara Olden, RN-C, WTA-C Wound Treatment Associate

## 2017-07-15 NOTE — Progress Notes (Signed)
SW contacted this RN regarding facility concerns related to ED report of beg bug on EMS stretcher. Patient and bed assessed for any sign of bed bugs, none found. Patient denies itchy skin or bites. Will continue to monitor.

## 2017-07-15 NOTE — Progress Notes (Signed)
   Subjective: No further bleeding.  No complaints  Objective: Vital signs in last 24 hours: Temp:  [97.5 F (36.4 C)-98.8 F (37.1 C)] 97.8 F (36.6 C) (09/11 2127) Pulse Rate:  [71-84] 78 (09/11 2127) Resp:  [16-18] 16 (09/11 2127) BP: (100-133)/(55-74) 100/55 (09/11 2127) SpO2:  [95 %-100 %] 100 % (09/11 2127) Last BM Date: 07/14/17  Intake/Output from previous day: 09/11 0701 - 09/12 0700 In: 1045 [P.O.:720; Blood:325] Out: -  Intake/Output this shift: No intake/output data recorded.  General appearance: alert and cooperative GI: soft Incision/Wound: large open perineal wound, necrosis of posterior vaginal wall noted.  No granulation tissue noted  Lab Results:  No results found for this or any previous visit (from the past 24 hour(s)).   Studies/Results Radiology     MEDS, Scheduled . carvedilol  6.25 mg Oral BID WC  . docusate sodium  100 mg Oral BID  . enoxaparin (LOVENOX) injection  40 mg Subcutaneous Q24H  . feeding supplement (ENSURE ENLIVE)  237 mL Oral TID BM  . ferrous sulfate  325 mg Oral BID WC  . FLUoxetine  20 mg Oral Daily  . multivitamin with minerals  1 tablet Oral Daily  . nicotine  7 mg Transdermal Daily  . pantoprazole  40 mg Oral Daily  . potassium chloride  20 mEq Oral BID     Assessment:  Cocaine/etoh abuse Noncompliance Perineal wound breakdown Malnutrition  Plan: Wound RN eval: cont gentle wet to dry dressing changes.  Pt not really interested in eventual vaginal reconstruction Social Work to plan for ECF placement upon d/c.  Pt also needs drug rehab.  She is not able to care for herself at home. Ensure supplements for severe malnutrition, prealbumin and albumin are very low Hypokalemia: cont replacements, recheck in AM Acute blood loss anemia: transfuse 1 unit  LOS: 5 days    Rosario Adie, MD [facility], [place] [phone]   07/15/2017 8:58 AM

## 2017-07-15 NOTE — Progress Notes (Signed)
IP PROGRESS NOTE  Subjective:   Sherry Baker completed radiation 05/01/2017. She underwent an APR procedure by Dr. Marcello Moores on 06/18/2017. She was readmitted with wound dehiscence 07/10/2017. She continues drug use.    The pathology from the APR procedure ([id number]) confirmed residual adenocarcinoma with associated fibrosis and inflammation. Tumor extended into perirectal connective tissue. Resection margins are negative. 15 lymph nodes were negative for metastatic carcinoma. No lymphovascular or perineural invasion. No tumor deposits. The tumor returned MSI-stable with no loss of mismatch repair protein expression. Objective: Vital signs in last 24 hours: Blood pressure (!) 100/55, pulse 78, temperature 97.8 F (36.6 C), temperature source Oral, resp. rate 16, height '5\' 2"'  (1.575 m), weight 108 lb 8 oz (49.2 kg), SpO2 100 %.  Intake/Output from previous day: 09/11 0701 - 09/12 0700 In: 1045 [P.O.:720; Blood:325] Out: -   Physical Exam: Not performed today    Lab Results:  Recent Labs  07/14/17 0535  WBC 5.5  HGB 6.6*  HCT 19.9*  PLT 151    BMET  Recent Labs  07/14/17 0535  NA 131*  K 4.2  CL 98*  CO2 26  GLUCOSE 100*  BUN 6  CREATININE 0.32*  CALCIUM 8.2*    Lab Results  Component Value Date   CEA1 7.83 (H) 02/02/2017    Studies/Results: No results found.  Medications: I have reviewed the patient's current medications.  Assessment/Plan:  1. Rectal cancer-distal rectal mass noted on colonoscopy 01/21/2017 with a biopsy confirming adenocarcinoma  ? CT abdomen/pelvis 01/17/2017 and CT chest 01/19/2017 with no evidence of distant metastatic disease ? MRI 02/18/2017-T3 N0, 6.5 cm from the anal verge ? Radiation/Xeloda initiated 02/17/2017; She did not receive radiation 03/04/2017 through 03/11/2017; received radiation 03/12/2017 and 03/13/2017; radiation then resumed 04/03/2017; she continued Xeloda, radiation completed 05/01/2017 ? APR 06/18/2018,ypT3,ypN0  tumor. MSI-stable, no loss of mismatch repair protein expression. Negative margins.  2. Iron deficiency anemia secondary to #1  3. Polysubstance abuse  4.   Tongue ulcer 04/21/2017-question mucositis related to Xeloda.  5.   Wound Dehiscence- Admission 09/072018   Sherry Baker is recovering from the APR procedure. The perineal wound remains open. The pathology from the APR confirmed a pathologic stage II tumor. She has a good prognosis for a long-term disease-free survival. I discussed the surgical pathology report with Sherry Baker.  I would recommend completing a course of adjuvant Xeloda if the wound healed and she had a better performance status.  I discussed the case with Dr. Marcello Moores. She does not feel the wound will heal in the near future.  I do not recommend adjuvant chemotherapy in her case.  We will schedule her for a return visit and CEA in approximately 2 months.     LOS: 5 days   Donneta Romberg, MD   07/15/2017, 12:51 PM

## 2017-07-16 ENCOUNTER — Other Ambulatory Visit: Payer: Self-pay | Admitting: Nurse Practitioner

## 2017-07-16 DIAGNOSIS — C20 Malignant neoplasm of rectum: Secondary | ICD-10-CM

## 2017-07-16 LAB — BASIC METABOLIC PANEL
Anion gap: 8 (ref 5–15)
CO2: 27 mmol/L (ref 22–32)
Calcium: 8.1 mg/dL — ABNORMAL LOW (ref 8.9–10.3)
Chloride: 96 mmol/L — ABNORMAL LOW (ref 101–111)
Creatinine, Ser: 0.34 mg/dL — ABNORMAL LOW (ref 0.44–1.00)
GFR calc Af Amer: >60 mL/min (ref >60)
GFR calc non Af Amer: >60 mL/min (ref >60)
GLUCOSE: 114 mg/dL — AB (ref 65–99)
POTASSIUM: 3.5 mmol/L (ref 3.5–5.1)
Sodium: 131 mmol/L — ABNORMAL LOW (ref 135–145)

## 2017-07-16 MED ORDER — OXYCODONE HCL 5 MG PO TABS
5.0000 mg | ORAL_TABLET | Freq: Three times a day (TID) | ORAL | Status: DC | PRN
  Administered 2017-07-16 – 2017-07-18 (×6): 10 mg via ORAL
  Filled 2017-07-16 (×6): qty 2

## 2017-07-16 MED ORDER — DAKINS (1/4 STRENGTH) 0.125 % EX SOLN
Freq: Every day | CUTANEOUS | Status: DC
  Administered 2017-07-16: 17:00:00
  Filled 2017-07-16: qty 473

## 2017-07-16 NOTE — Care Management Note (Signed)
Case Management Note  Patient Details  Name: Sherry Baker MRN: [id number] Date of Birth: 01/04/1966  Subjective/Objective:         Wound packing and dsg.ing changes bid           Action/Plan: Date:  July 16, 2017 Chart reviewed for concurrent status and case management needs.  Will continue to follow patient progress.  Discharge Planning: following for needs  Expected discharge date: [id number]  [name], BSN, [place], [place]   Expected Discharge Date:                  Expected Discharge Plan:  [place]  In-House Referral:     Discharge planning Services  CM Consult  Post Acute Care Choice:  Resumption of Svcs/PTA Provider Choice offered to:     DME Arranged:    DME Agency:     HH Arranged:    [place] Agency:     Status of Service:  In process, will continue to follow  If discussed at Long Length of Stay Meetings, dates discussed:    Additional Comments:  Leeroy Cha, RN 07/16/2017, 9:37 AM

## 2017-07-16 NOTE — Progress Notes (Signed)
Physical Therapy Treatment and Discharge from Acute PT Patient Details Name: Sherry Baker MRN: [id number] DOB: Mar 24, 1966 Today's Date: 07/16/2017    History of Present Illness 51 yo female admitted with perineum wound dehiscence, separation of midline abdominal wound, fall at home. Hx of rectal cancer, abd/perineal resection 8/16, drug and ETOH abuse    PT Comments    Pt up in doorway/hallway with RW and her aunt upon arrival.  Pt steady with ambulation using RW and requests RW upon d/c.  Pt reports being up ad lib in her room and ambulating in hallway with RW and supervision.  Pt has met acute PT goals.  Pt encouraged to continue ambulating with supervision.  PT to sign off.    Follow Up Recommendations  Other (comment) (no further PT f/u however pt may need higher level of care for medical needs)     Equipment Recommendations  Rolling walker with 5" wheels (pt feels more steady with RW, would like RW upon d/c)    Recommendations for Other Services       Precautions / Restrictions Precautions Precautions: Fall Precaution Comments: abdominal and perineal wounds    Mobility  Bed Mobility Overal bed mobility: Modified Independent                Transfers Overall transfer level: Modified independent                  Ambulation/Gait Ambulation/Gait assistance: Supervision Ambulation Distance (Feet): 350 Feet Assistive device: Rolling walker (2 wheeled) Gait Pattern/deviations: Step-through pattern;Decreased stride length     General Gait Details: slow but steady pace, pt prefers RW for more support   Stairs            Wheelchair Mobility    Modified Rankin (Stroke Patients Only)       Balance                                            Cognition Arousal/Alertness: Awake/alert Behavior During Therapy: WFL for tasks assessed/performed Overall Cognitive Status: Within Functional Limits for tasks assessed                                        Exercises      General Comments        Pertinent Vitals/Pain Pain Assessment: 0-10 Pain Score: 6  Pain Location: abdominal, perineal areas Pain Descriptors / Indicators: Sore Pain Intervention(s): Monitored during session    Home Living                      Prior Function            PT Goals (current goals can now be found in the care plan section) Progress towards PT goals: Goals met/education completed, patient discharged from PT    Frequency           PT Plan Other (comment) (d/c from acute PT)    Co-evaluation              AM-PAC PT "6 Clicks" Daily Activity  Outcome Measure  Difficulty turning over in bed (including adjusting bedclothes, sheets and blankets)?: None Difficulty moving from lying on back to sitting on the side of the bed? : None Difficulty sitting down on and standing  up from a chair with arms (e.g., wheelchair, bedside commode, etc,.)?: None Help needed moving to and from a bed to chair (including a wheelchair)?: None Help needed walking in hospital room?: A Little Help needed climbing 3-5 steps with a railing? : A Little 6 Click Score: 22    End of Session   Activity Tolerance: Patient tolerated treatment well Patient left: Other (comment) (up in room)   PT Visit Diagnosis: Muscle weakness (generalized) (M62.81);Difficulty in walking, not elsewhere classified (R26.2)     Time: 6195-0932 PT Time Calculation (min) (ACUTE ONLY): 18 min  Charges:  $Gait Training: 8-22 mins                    G Codes:       Carmelia Bake, PT, DPT 07/16/2017 Pager: [phone]  York Ram E 07/16/2017, 3:52 PM

## 2017-07-16 NOTE — Progress Notes (Signed)
   Subjective: No further bleeding.  Complains of pain mostly in the perineum.  Objective: Vital signs in last 24 hours: Temp:  [97.7 F (36.5 C)-99.1 F (37.3 C)] 99.1 F (37.3 C) (09/12 2218) Pulse Rate:  [79-85] 85 (09/13 0527) Resp:  [18] 18 (09/13 0527) BP: (116-152)/(67-87) 133/87 (09/13 0527) SpO2:  [100 %] 100 % (09/13 0527) Last BM Date: 07/16/17  Intake/Output from previous day: 09/12 0701 - 09/13 0700 In: 700 [P.O.:700] Out: -  Intake/Output this shift: Total I/O In: 240 [P.O.:240] Out: -   General appearance: alert and cooperative GI: soft Incision/Wound: large open perineal wound, necrosis of posterior vaginal wall noted.  No granulation tissue noted  Lab Results:  Results for orders placed or performed during the hospital encounter of 07/09/17 (from the past 24 hour(s))  Basic metabolic panel     Status: Abnormal   Collection Time: 07/16/17  6:20 AM  Result Value Ref Range   Sodium 131 (L) 135 - 145 mmol/L   Potassium 3.5 3.5 - 5.1 mmol/L   Chloride 96 (L) 101 - 111 mmol/L   CO2 27 22 - 32 mmol/L   Glucose, Bld 114 (H) 65 - 99 mg/dL   BUN <5 (L) 6 - 20 mg/dL   Creatinine, Ser 0.34 (L) 0.44 - 1.00 mg/dL   Calcium 8.1 (L) 8.9 - 10.3 mg/dL   GFR calc non Af Amer >60 >60 mL/min   GFR calc Af Amer >60 >60 mL/min   Anion gap 8 5 - 15     Studies/Results Radiology     MEDS, Scheduled . carvedilol  6.25 mg Oral BID WC  . docusate sodium  100 mg Oral BID  . enoxaparin (LOVENOX) injection  40 mg Subcutaneous Q24H  . feeding supplement (ENSURE ENLIVE)  237 mL Oral TID BM  . ferrous sulfate  325 mg Oral BID WC  . FLUoxetine  20 mg Oral Daily  . multivitamin with minerals  1 tablet Oral Daily  . nicotine  7 mg Transdermal Daily  . pantoprazole  40 mg Oral Daily  . potassium chloride  20 mEq Oral BID     Assessment:  Cocaine/etoh abuse Noncompliance Perineal wound breakdown Malnutrition  Plan: Wound RN eval: cont gentle wet to dry dressing  changes.  Pt not really interested in eventual vaginal reconstruction Social Work to plan for ECF placement upon d/c.  Pt also needs drug rehab.  She is not able to care for herself at home. Ensure supplements for severe malnutrition, prealbumin and albumin are very low Hypokalemia: cont replacements, resolved Acute blood loss anemia: recheck in AM  LOS: 6 days    Rosario Adie, MD [facility], [employer]   07/16/2017 12:48 PM

## 2017-07-17 LAB — CBC
HCT: 25.3 % — ABNORMAL LOW (ref 36.0–46.0)
HEMOGLOBIN: 8.4 g/dL — AB (ref 12.0–15.0)
MCH: 28 pg (ref 26.0–34.0)
MCHC: 33.2 g/dL (ref 30.0–36.0)
MCV: 84.3 fL (ref 78.0–100.0)
Platelets: 288 10*3/uL (ref 150–400)
RBC: 3 MIL/uL — AB (ref 3.87–5.11)
RDW: 16.7 % — ABNORMAL HIGH (ref 11.5–15.5)
WBC: 6.2 10*3/uL (ref 4.0–10.5)

## 2017-07-17 NOTE — Progress Notes (Signed)
Report given to [facility] on [place].

## 2017-07-17 NOTE — Consult Note (Signed)
[place] Nurse wound follow up Wound type:surgical dehiscence Measurement:see previous note Wound bed:see previous note Dressing procedure/placement/frequency: I was stopped by patient's bedside RN and asked about the patient's wound odor returning. Pt is up ambulating in hallway and you can smell the wound as she ambulates by.  Reordered Dakins Solution 1/4% for three days and then returning to the NS wet to dry orders from Dr. Marcello Moores. We will continue to follow this patient for ostomy needs and wound care needs if they arise.   Fara Olden, RN-C, WTA-C Wound Treatment Associate

## 2017-07-17 NOTE — Progress Notes (Signed)
   Subjective: No further bleeding.  Complains of pain mostly in the perineum.  Objective: Vital signs in last 24 hours: Temp:  [97.5 F (36.4 C)-98.9 F (37.2 C)] 97.5 F (36.4 C) (09/14 0511) Pulse Rate:  [77-87] 87 (09/14 0511) Resp:  [18] 18 (09/14 0511) BP: (130-155)/(61-85) 155/85 (09/14 0511) SpO2:  [100 %] 100 % (09/14 0511) Last BM Date: 07/16/17  Intake/Output from previous day: 09/13 0701 - 09/14 0700 In: 240 [P.O.:240] Out: -  Intake/Output this shift: No intake/output data recorded.  General appearance: alert and cooperative GI: soft Incision/Wound: large open perineal wound, packed  Lab Results:  No results found for this or any previous visit (from the past 24 hour(s)).   Studies/Results Radiology     MEDS, Scheduled . carvedilol  6.25 mg Oral BID WC  . docusate sodium  100 mg Oral BID  . enoxaparin (LOVENOX) injection  40 mg Subcutaneous Q24H  . feeding supplement (ENSURE ENLIVE)  237 mL Oral TID BM  . ferrous sulfate  325 mg Oral BID WC  . FLUoxetine  20 mg Oral Daily  . multivitamin with minerals  1 tablet Oral Daily  . nicotine  7 mg Transdermal Daily  . pantoprazole  40 mg Oral Daily  . potassium chloride  20 mEq Oral BID     Assessment:  Cocaine/etoh abuse Noncompliance Perineal wound breakdown Malnutrition  Plan: cont gentle wet to dry dressing changes.  Pt not really interested in eventual vaginal reconstruction Social Work to plan for ECF placement upon d/c.  Pt also needs drug rehab.  She is not able to care for herself at home. Ensure supplements for severe malnutrition, prealbumin and albumin are very low Hypokalemia: cont replacements, resolved Acute blood loss anemia: labs ordered but have not been resulted yet  LOS: 7 days    Sherry Adie, MD [facility], [place]   07/17/2017 8:09 AM

## 2017-07-18 MED ORDER — OXYCODONE HCL 5 MG PO TABS
5.0000 mg | ORAL_TABLET | ORAL | Status: DC | PRN
Start: 2017-07-18 — End: 2017-07-19
  Administered 2017-07-18 – 2017-07-19 (×5): 5 mg via ORAL
  Filled 2017-07-18 (×5): qty 1

## 2017-07-18 NOTE — Progress Notes (Signed)
   Subjective: Complains of throbbing pain in perineum.  Feels like pain medication is too far apart.    Objective: Vital signs in last 24 hours: Temp:  [98.8 F (37.1 C)-99 F (37.2 C)] 99 F (37.2 C) (09/15 0503) Pulse Rate:  [76-92] 76 (09/15 0503) Resp:  [18-20] 18 (09/15 0503) BP: (123-127)/(66-78) 127/66 (09/15 0503) SpO2:  [98 %-100 %] 100 % (09/15 0503) Last BM Date: 07/16/17  Intake/Output from previous day: 09/14 0701 - 09/15 0700 In: 600 [P.O.:600] Out: 1 [Stool:1] Intake/Output this shift: No intake/output data recorded.  General appearance: alert and cooperative GI: soft Incision/Wound: large open perineal wound, packed.  Foul odor.   Resp:  Breathing comfortably  Lab Results:  No results found for this or any previous visit (from the past 24 hour(s)).   Studies/Results Radiology     MEDS, Scheduled . carvedilol  6.25 mg Oral BID WC  . docusate sodium  100 mg Oral BID  . enoxaparin (LOVENOX) injection  40 mg Subcutaneous Q24H  . feeding supplement (ENSURE ENLIVE)  237 mL Oral TID BM  . ferrous sulfate  325 mg Oral BID WC  . FLUoxetine  20 mg Oral Daily  . multivitamin with minerals  1 tablet Oral Daily  . nicotine  7 mg Transdermal Daily  . pantoprazole  40 mg Oral Daily  . potassium chloride  20 mEq Oral BID     Assessment:  Cocaine/etoh abuse Noncompliance Perineal wound breakdown Malnutrition  Plan: cont gentle wet to dry dressing changes.  Pt not really interested in eventual vaginal reconstruction Social Work to plan for ECF placement upon d/c.  Pt also needs drug rehab.  She is not able to care for herself at home. Ensure supplements for severe malnutrition, prealbumin and albumin are very low Hypokalemia: cont replacements, resolved Acute blood loss anemia: improved with appropriate bump after transfusion.     LOS: 8 days    Milus Height, [facility], [place]   07/18/2017 8:32 AM

## 2017-07-19 MED ORDER — HYDROMORPHONE HCL 2 MG PO TABS
2.0000 mg | ORAL_TABLET | ORAL | Status: DC | PRN
  Administered 2017-07-19 – 2017-07-20 (×6): 2 mg via ORAL
  Filled 2017-07-19 (×6): qty 1

## 2017-07-19 NOTE — Progress Notes (Signed)
   Subjective: Complains of throbbing pain in perineum still.  Tried 5 mg oxy every 4 yesterday instead of 10 mg every 8 and still is in significant pain.    Objective: Vital signs in last 24 hours: Temp:  [97.5 F (36.4 C)-98.6 F (37 C)] 97.5 F (36.4 C) (09/16 0554) Pulse Rate:  [83-87] 85 (09/16 0554) Resp:  [18] 18 (09/16 0554) BP: (103-141)/(61-78) 115/78 (09/16 0554) SpO2:  [99 %-100 %] 100 % (09/16 0554) Last BM Date: 07/16/17  Intake/Output from previous day: 09/15 0701 - 09/16 0700 In: 1600 [P.O.:1600] Out: 4 [Stool:4] Intake/Output this shift: Total I/O In: 500 [P.O.:500] Out: -   General appearance: alert and cooperative GI: soft Incision/Wound: large open perineal wound, packed.  Odor improved yesterday. Resp:  Breathing comfortably Stool, gas in stoma bag.    Lab Results:  No results found for this or any previous visit (from the past 24 hour(s)).   Studies/Results Radiology     MEDS, Scheduled . carvedilol  6.25 mg Oral BID WC  . docusate sodium  100 mg Oral BID  . enoxaparin (LOVENOX) injection  40 mg Subcutaneous Q24H  . feeding supplement (ENSURE ENLIVE)  237 mL Oral TID BM  . ferrous sulfate  325 mg Oral BID WC  . FLUoxetine  20 mg Oral Daily  . multivitamin with minerals  1 tablet Oral Daily  . nicotine  7 mg Transdermal Daily  . pantoprazole  40 mg Oral Daily  . potassium chloride  20 mEq Oral BID     Assessment:  Cocaine/etoh abuse Noncompliance Perineal wound breakdown Malnutrition  Plan: cont gentle wet to dry dressing changes.  Pt not really interested in eventual vaginal reconstruction Social Work to plan for ECF placement upon d/c.  Pt also needs drug rehab.  She is not able to care for herself at home. Ensure supplements for severe malnutrition, prealbumin and albumin are very low Hypokalemia: cont replacements, resolved Acute blood loss anemia: improved with appropriate bump after transfusion.   Changed to oral dilaudid  given severe pain.   LOS: 9 days    Milus Height, [facility], [place]   07/19/2017 8:52 AM

## 2017-07-19 NOTE — Progress Notes (Signed)
REport from [place], [place]. Care assumed for pt at 1530. Pt resting in bed, watching TV. States pain tolerable at 3/10. Assessment unchanged from AM assessment. Will monitor.

## 2017-07-20 ENCOUNTER — Ambulatory Visit: Payer: Self-pay | Admitting: Family Medicine

## 2017-07-20 MED ORDER — PRO-STAT SUGAR FREE PO LIQD
30.0000 mL | Freq: Two times a day (BID) | ORAL | Status: DC
  Administered 2017-07-20 – 2017-07-21 (×3): 30 mL via ORAL
  Filled 2017-07-20 (×2): qty 30

## 2017-07-20 MED ORDER — HYDROMORPHONE HCL 2 MG PO TABS
2.0000 mg | ORAL_TABLET | Freq: Four times a day (QID) | ORAL | Status: DC | PRN
  Administered 2017-07-20 – 2017-07-21 (×4): 2 mg via ORAL
  Filled 2017-07-20 (×4): qty 1

## 2017-07-20 NOTE — Progress Notes (Signed)
CSW assisting with dc planning. CSW has reported back to [facility] that pt has no indication of bed begs. Nathaniel Man, Surveyor, quantity of CSW, will follow up with [facility] leadership to re request " difficult to place bed " for this pt. CSW will continue to follow to assist with dc planning needs.  Werner Lean LCSW [phone]

## 2017-07-20 NOTE — Progress Notes (Signed)
Nutrition Follow-up  DOCUMENTATION CODES:   Not applicable  INTERVENTION:    Ensure Enlive po TID, each supplement provides 350 kcal and 20 grams of protein  30 ml Prostat BID, each supplement provides 100 kcals and 15 grams protein.   Provide MVI daily  Monitor and supplement electrolytes as need per MD descretion.   NUTRITION DIAGNOSIS:   Malnutrition (Severe) related to social / environmental circumstances, chronic illness (Polysubstance abuse, rectal cancer) as evidenced by percent weight loss, energy intake < or equal to 50% for > or equal to 1 month, moderate depletion of body fat, severe depletion of muscle mass.  Ongoing  GOAL:   Patient will meet greater than or equal to 90% of their needs  Progressing- 80% average for last 7 meals  MONITOR:   PO intake, Supplement acceptance, Labs, Weight trends  REASON FOR ASSESSMENT:   Consult Assessment of nutrition requirement/status  ASSESSMENT:   Pt with PMH of polysubstance abuse, cirrhosis, rectal cancer (currently undergoing chemotherapy and radiation), depression, and HTN. Pt recently admitted 8/16 for perineal resection, discharged 8/20. Presents this admission with c/o falling at home resulting in dehiscence wound in the perineum.    9/17- Appetite remains stable. Pt consuming 80% average of last 7 meals. RD to add Prostat 30 ml BID with Ensure Enlive TID for increased needs. Pt still awaiting SNF placement. Spoke with her at bedside. Pt upset about her medication regimen, but reports no issues from eating stand point. No new wts have been obtained, will speak with nursing to get one this week. Electrolytes noted to remain low. Pt receiving supplementation through IV. PAB shows to be low, will continue to encourage PO intake and montior supplementation tolerance.   9/11- Pt reports appetite is increasing back to baseline, consuming an average of 71% of last 8 meals. Pt drinking Ensure Enlive twice per day. Would like  to change Ensure for BID to TID given wound status. Wound being cared for by wound care nursing team. Pt awaiting SNF placement. Mg noted to be low, MD to to replace. Wt noted to be up 6 lb since admission. Will continue to encourage PO intake and monitor supplement acceptance.   Medications reviewed and include: ferrous sulfate, MVI with minerals, KCl, NaCl with KCl and D5 @ 10 ml/hr Labs reviewed: Na 131 (L) Cl 96 (L) Creatinine 0.34 (L) Albumin 1.9 (L) Mg 1.4 (L) PAB 6.8 (L)  Diet Order:  Diet regular Room service appropriate? Yes; Fluid consistency: Thin  Skin:   (large open perineal wound, necrosis of posterior vaginal wall noted)  Last BM:  07/19/17  Height:   Ht Readings from Last 1 Encounters:  07/10/17 5\' 2"  (1.575 m)    Weight:   Wt Readings from Last 1 Encounters:  07/10/17 108 lb 8 oz (49.2 kg)    Ideal Body Weight:  50 kg  BMI:  Body mass index is 19.84 kg/m.  Estimated Nutritional Needs:   Kcal:  1550-1750 (32-36 kcal/kg)  Protein:  100-110 grams (2-2.2 g/kg)  Fluid:  >1.5 L/day  EDUCATION NEEDS:   Education needs addressed  [place], LDN Clinical Nutrition Pager # [phone]

## 2017-07-20 NOTE — Progress Notes (Signed)
   Subjective: No complaints this am  Objective: Vital signs in last 24 hours: Temp:  [98.1 F (36.7 C)-99.9 F (37.7 C)] 98.2 F (36.8 C) (09/17 0540) Pulse Rate:  [76-92] 81 (09/17 0540) Resp:  [16-18] 18 (09/17 0540) BP: (114-129)/(67-78) 114/72 (09/17 0540) SpO2:  [99 %-100 %] 100 % (09/17 0540) Last BM Date: 07/19/17  Intake/Output from previous day: 09/16 0701 - 09/17 0700 In: 1100 [P.O.:1100] Out: -  Intake/Output this shift: No intake/output data recorded.  General appearance: alert and cooperative GI: soft Incision/Wound: large open perineal wound, packed  Lab Results:  No results found for this or any previous visit (from the past 24 hour(s)).   Studies/Results Radiology     MEDS, Scheduled . carvedilol  6.25 mg Oral BID WC  . docusate sodium  100 mg Oral BID  . enoxaparin (LOVENOX) injection  40 mg Subcutaneous Q24H  . feeding supplement (ENSURE ENLIVE)  237 mL Oral TID BM  . ferrous sulfate  325 mg Oral BID WC  . FLUoxetine  20 mg Oral Daily  . multivitamin with minerals  1 tablet Oral Daily  . nicotine  7 mg Transdermal Daily  . pantoprazole  40 mg Oral Daily  . potassium chloride  20 mEq Oral BID     Assessment:  Cocaine/etoh abuse Noncompliance Perineal wound breakdown Malnutrition  Plan: cont gentle wet to dry dressing changes.  Pt not really interested in eventual vaginal reconstruction Social Work to plan for ECF placement upon d/c.  Pt also needs drug rehab.  She is not able to care for herself at home. Ensure supplements for severe malnutrition, prealbumin and albumin are very low Hypokalemia: cont replacements, resolved  Acute blood loss anemia: lhgb stable  LOS: 10 days    Rosario Adie, MD [facility], [place] [phone]   07/20/2017 8:12 AM

## 2017-07-21 ENCOUNTER — Inpatient Hospital Stay
Admission: RE | Admit: 2017-07-21 | Discharge: 2017-07-22 | Discharge status: Against Medical Advice | Payer: Self-pay | Source: Ambulatory Visit | Attending: Internal Medicine | Admitting: Internal Medicine

## 2017-07-21 ENCOUNTER — Other Ambulatory Visit: Payer: Self-pay

## 2017-07-21 LAB — BASIC METABOLIC PANEL
Anion gap: 10 (ref 5–15)
BUN: 6 mg/dL (ref 6–20)
CALCIUM: 8.2 mg/dL — AB (ref 8.9–10.3)
CHLORIDE: 96 mmol/L — AB (ref 101–111)
CO2: 26 mmol/L (ref 22–32)
GLUCOSE: 106 mg/dL — AB (ref 65–99)
Potassium: 3.6 mmol/L (ref 3.5–5.1)
Sodium: 132 mmol/L — ABNORMAL LOW (ref 135–145)

## 2017-07-21 LAB — PREALBUMIN: Prealbumin: 8.7 mg/dL — ABNORMAL LOW (ref 18–38)

## 2017-07-21 MED ORDER — HYDROMORPHONE HCL 2 MG PO TABS: 2.0000 mg | ORAL_TABLET | Freq: Four times a day (QID) | ORAL | 0 refills | Status: DC | PRN

## 2017-07-21 MED ORDER — PANTOPRAZOLE SODIUM 40 MG PO TBEC: 40.0000 mg | DELAYED_RELEASE_TABLET | Freq: Every day | ORAL | 3 refills | Status: DC

## 2017-07-21 MED ORDER — FLUOXETINE HCL 20 MG PO TABS: 20.0000 mg | ORAL_TABLET | Freq: Every day | ORAL | 3 refills | Status: DC

## 2017-07-21 MED ORDER — FERROUS SULFATE 325 (65 FE) MG PO TABS: 325.0000 mg | ORAL_TABLET | Freq: Two times a day (BID) | ORAL | 3 refills | Status: DC

## 2017-07-21 MED ORDER — DOCUSATE SODIUM 100 MG PO CAPS: 100.0000 mg | ORAL_CAPSULE | Freq: Two times a day (BID) | ORAL | 0 refills | Status: DC

## 2017-07-21 MED ORDER — PRO-STAT SUGAR FREE PO LIQD: 30.0000 mL | Freq: Two times a day (BID) | ORAL | 0 refills | Status: DC

## 2017-07-21 MED ORDER — ENSURE ENLIVE PO LIQD: 237.0000 mL | Freq: Three times a day (TID) | ORAL | 12 refills | Status: DC

## 2017-07-21 MED ORDER — CARVEDILOL 6.25 MG PO TABS: 6.2500 mg | ORAL_TABLET | Freq: Two times a day (BID) | ORAL | 3 refills | Status: DC

## 2017-07-21 MED ORDER — ADULT MULTIVITAMIN W/MINERALS CH: 1.0000 | ORAL_TABLET | Freq: Every day | ORAL | Status: DC

## 2017-07-21 MED ORDER — POTASSIUM CHLORIDE CRYS ER 20 MEQ PO TBCR: 20.0000 meq | EXTENDED_RELEASE_TABLET | Freq: Two times a day (BID) | ORAL | Status: DC

## 2017-07-21 MED ORDER — NICOTINE 7 MG/24HR TD PT24: 7.0000 mg | MEDICATED_PATCH | Freq: Every day | TRANSDERMAL | 0 refills | Status: DC

## 2017-07-21 NOTE — Progress Notes (Signed)
   Subjective: Complains of a throbbing pain  Objective: Vital signs in last 24 hours: Temp:  [98.6 F (37 C)-98.7 F (37.1 C)] 98.6 F (37 C) (09/18 0556) Pulse Rate:  [78-86] 86 (09/18 0556) Resp:  [18] 18 (09/18 0556) BP: (110-114)/(54-69) 110/54 (09/18 0556) SpO2:  [92 %-98 %] 98 % (09/18 0556) Last BM Date: 07/19/17  Intake/Output from previous day: 09/17 0701 - 09/18 0700 In: 720 [P.O.:720] Out: 3 [Stool:3] Intake/Output this shift: No intake/output data recorded.  General appearance: alert and cooperative GI: soft Incision/Wound: large open perineal wound, some granulation tissue starting to form  Lab Results:  Results for orders placed or performed during the hospital encounter of 07/09/17 (from the past 24 hour(s))  Basic metabolic panel     Status: Abnormal   Collection Time: 07/21/17  5:38 AM  Result Value Ref Range   Sodium 132 (L) 135 - 145 mmol/L   Potassium 3.6 3.5 - 5.1 mmol/L   Chloride 96 (L) 101 - 111 mmol/L   CO2 26 22 - 32 mmol/L   Glucose, Bld 106 (H) 65 - 99 mg/dL   BUN 6 6 - 20 mg/dL   Creatinine, Ser <0.30 (L) 0.44 - 1.00 mg/dL   Calcium 8.2 (L) 8.9 - 10.3 mg/dL   GFR calc non Af Amer NOT CALCULATED >60 mL/min   GFR calc Af Amer NOT CALCULATED >60 mL/min   Anion gap 10 5 - 15     Studies/Results Radiology     MEDS, Scheduled . carvedilol  6.25 mg Oral BID WC  . docusate sodium  100 mg Oral BID  . enoxaparin (LOVENOX) injection  40 mg Subcutaneous Q24H  . feeding supplement (ENSURE ENLIVE)  237 mL Oral TID BM  . feeding supplement (PRO-STAT SUGAR FREE 64)  30 mL Oral BID  . ferrous sulfate  325 mg Oral BID WC  . FLUoxetine  20 mg Oral Daily  . multivitamin with minerals  1 tablet Oral Daily  . nicotine  7 mg Transdermal Daily  . pantoprazole  40 mg Oral Daily  . 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  Pt not really interested in eventual vaginal reconstruction Social Work to plan for ECF placement upon d/c.  She is not able to care for herself at home. Nutritional supplements, iron supplements and multivitamin daily for severe malnutrition Hypokalemia: cont replacements, normal with last bmet Acute blood loss anemia: hgb stable  LOS: 11 days    Rosario Adie, MD [facility] Surgery, [place]   07/21/2017 8:17 AM

## 2017-07-21 NOTE — Clinical Social Work Placement (Signed)
   CLINICAL SOCIAL WORK PLACEMENT  NOTE  Date:  07/21/2017  Patient Details  Name: Sherry Baker MRN: [id number] Date of Birth: [birth year]  Clinical Social Work is seeking post-discharge placement for this patient at the [place] level of care (*CSW will initial, date and re-position this form in  chart as items are completed):  Yes   Patient/family provided with [facility] Work Department's list of facilities offering this level of care within the geographic area requested by the patient (or if unable, by the patient's family).  Yes   Patient/family informed of their freedom to choose among providers that offer the needed level of care, that participate in Medicare, Medicaid or managed care program needed by the patient, have an available bed and are willing to accept the patient.  Yes   Patient/family informed of [facility]'s ownership interest in [facility] and [facility], as well as of the fact that they are under no obligation to receive care at these facilities.  PASRR submitted to EDS on 07/15/17     PASRR number received on 07/16/17     Existing PASRR number confirmed on       FL2 transmitted to all facilities in geographic area requested by pt/family on 07/15/17     FL2 transmitted to all facilities within larger geographic area on       Patient informed that his/her managed care company has contracts with or will negotiate with certain facilities, including the following:        Yes   Patient/family informed of bed offers received.  Patient chooses bed at [facility]     Physician recommends and patient chooses bed at      Patient to be transferred to [facility] on 07/21/17.  Patient to be transferred to facility by [facility]     Patient family notified on 07/21/17 of transfer.  Name of family member notified:  Pt will contact family directly.     PHYSICIAN       Additional Comment: Pt is in agreement with  dc to [facility] today. NSG recommends [facility] transport. Medical necessity form completed. Pt is being placed in a " Difficult to Place ". DC Summary has been sent to SNF for review. Scripts included in [employer]. # for report provided to nsg.   _______________________________________________ Luretha Rued, [place]  [phone] 07/21/2017, 9:16 AM

## 2017-07-21 NOTE — Telephone Encounter (Signed)
RX Fax for [facility]@ [phone]  

## 2017-07-21 NOTE — Discharge Instructions (Signed)
[facility] Stay Proper nutrition can help your body recover from illness and injury.   Foods and beverages high in protein, vitamins, and minerals help rebuild muscle loss, promote healing, & reduce fall risk.   In addition to eating healthy foods, a nutrition shake is an easy, delicious way to get the nutrition you need during and after your hospital stay  It is recommended that you continue to drink 2 bottles per day of:  Ensure Enlive for at least 1 month (30 days) after your hospital stay   Tips for adding a nutrition shake into your routine: As allowed, drink one with vitamins or medications instead of water or juice Enjoy one as a tasty mid-morning or afternoon snack Drink cold or make a milkshake out of it Drink one instead of milk with cereal or snacks Use as a coffee creamer   Available at the following grocery stores and pharmacies:           * [place] [phone]            For COUPONS visit: www.ensure.com/join or http://dawson-may.com/   Suggested Substitutions Ensure Plus = Boost Plus = Carnation Breakfast Essentials = Boost Compact Ensure Active Clear = Boost Breeze Glucerna Shake = Boost Glucose Control = Carnation Breakfast Essentials SUGAR FREE    Wound care Perineal wound: Remove packing.  Have pt cleanse area in sitz bath for 15-20 mins.  Repack with saline moistened kerlex roll.  Cover with ABD and mesh panties.  Change TID  Midline wound: wet to dry dressing daily until healed.

## 2017-07-21 NOTE — Progress Notes (Signed)
Sherry Baker has a SNF bed for pt today. CSW will assist with dc planning to SNF.  Werner Lean LCSW [phone]

## 2017-07-21 NOTE — Progress Notes (Signed)
[facility] here to take pt to SNF. Donne Hazel, RN

## 2017-07-21 NOTE — Discharge Summary (Signed)
Physician Discharge Summary  Patient ID: Sherry Baker MRN: [id number] DOB/AGE: 11/23/1965 51 y.o.  Admit date: 07/09/2017 Discharge date: 07/21/2017  Admission Diagnoses:   Discharge Diagnoses:  Active Problems:   Wound dehiscence   Discharged Condition: good  Hospital Course: Pt admitted to the hospital ~ 2 weeks s/p APR for malnutrition, wound dehiscence and FTT.  Pt noncompliant at home.  She was noted to have developed necrosis of her posterior vaginal wall.  She was kept in the hospital until she developed signs of wound healing.  She was then sent to a nursing facility to continue dressing changes until wound is healed.    Consults: nutrition, wound care  Significant Diagnostic Studies: labs: cbc, chemistry  Treatments: IV hydration and analgesia: Dilaudid  Discharge Exam: Blood pressure (!) 110/54, pulse 86, temperature 98.6 F (37 C), temperature source Oral, resp. rate 18, height 5\' 2"  (1.575 m), weight 49.2 kg (108 lb 8 oz), SpO2 98 %. General appearance: alert and cooperative GI: normal findings: soft, non-tender Incision/Wound: clean, granulation tissue developing on sides of wound  Disposition: 06-Home-Health Care Svc   Allergies as of 07/21/2017      Reactions   Penicillins Hives, Swelling   Has patient had a PCN reaction causing immediate rash, facial/tongue/throat swelling, SOB or lightheadedness with hypotension: Yes Has patient had a PCN reaction causing severe rash involving mucus membranes or skin necrosis: No Has patient had a PCN reaction that required hospitalization No Has patient had a PCN reaction occurring within the last 10 years: Yes If all of the above answers are "NO", then may proceed with Cephalosporin use.      Medication List    TAKE these medications   carvedilol 6.25 MG tablet Commonly known as:  COREG Take 1 tablet (6.25 mg total) by mouth 2 (two) times daily with a meal.   docusate sodium 100 MG capsule Commonly known as:   COLACE Take 1 capsule (100 mg total) by mouth 2 (two) times daily.   feeding supplement (ENSURE ENLIVE) Liqd Take 237 mLs by mouth 3 (three) times daily between meals.   feeding supplement (PRO-STAT SUGAR FREE 64) Liqd Take 30 mLs by mouth 2 (two) times daily.   ferrous sulfate 325 (65 FE) MG tablet Take 1 tablet (325 mg total) by mouth 2 (two) times daily with a meal.   FLUoxetine 20 MG tablet Commonly known as:  PROZAC Take 1 tablet (20 mg total) by mouth daily.   HYDROmorphone 2 MG tablet Commonly known as:  DILAUDID Take 1 tablet (2 mg total) by mouth every 6 (six) hours as needed for severe pain.   multivitamin with minerals Tabs tablet Take 1 tablet by mouth daily.   nicotine 7 mg/24hr patch Commonly known as:  NICODERM CQ - dosed in mg/24 hr Place 1 patch (7 mg total) onto the skin daily.   pantoprazole 40 MG tablet Commonly known as:  PROTONIX Take 1 tablet (40 mg total) by mouth daily.   potassium chloride SA 20 MEQ tablet Commonly known as:  K-DUR,KLOR-CON Take 1 tablet (20 mEq total) by mouth 2 (two) times daily.            Discharge Care Instructions        Start     Ordered   07/21/17 0000  carvedilol (COREG) 6.25 MG tablet  2 times daily with meals     07/21/17 0833   07/21/17 0000  FLUoxetine (PROZAC) 20 MG tablet  Daily     07/21/17  7847   07/21/17 0000  pantoprazole (PROTONIX) 40 MG tablet  Daily     07/21/17 0833   07/21/17 0000  Multiple Vitamin (MULTIVITAMIN WITH MINERALS) TABS tablet  Daily     07/21/17 0833   07/21/17 0000  ferrous sulfate 325 (65 FE) MG tablet  2 times daily with meals     07/21/17 0833   07/21/17 0000  docusate sodium (COLACE) 100 MG capsule  2 times daily     07/21/17 0833   07/21/17 0000  potassium chloride SA (K-DUR,KLOR-CON) 20 MEQ tablet  2 times daily     07/21/17 0833   07/21/17 0000  feeding supplement, ENSURE ENLIVE, (ENSURE ENLIVE) LIQD  3 times daily between meals     07/21/17 0833   07/21/17 0000   nicotine (NICODERM CQ - DOSED IN MG/24 HR) 7 mg/24hr patch  Daily     07/21/17 0833   07/21/17 0000  HYDROmorphone (DILAUDID) 2 MG tablet  Every 6 hours PRN     07/21/17 0833   07/21/17 0000  Amino Acids-Protein Hydrolys (FEEDING SUPPLEMENT, PRO-STAT SUGAR FREE 64,) LIQD  2 times daily     07/21/17 8412       Signed: Leighton Ruff C. 06/22/8137, 8:71 AM

## 2017-07-26 ENCOUNTER — Emergency Department (HOSPITAL_COMMUNITY): Admission: EM | Admit: 2017-07-26 | Discharge: 2017-07-26 | Discharge status: Against Medical Advice | Payer: Self-pay

## 2017-07-26 NOTE — ED Notes (Signed)
Pt called for Vital signs with no answer

## 2017-07-26 NOTE — ED Triage Notes (Signed)
Pt arrived via EMS. Upon being told that she would have to wait in the lobby, pt left triage. Pt left prior to being registered with EMS. EMS reports pt has been having rectal pain and is out of colostomy bags. Hx of rectal surgery in June, and pt reports the surgical site is infected. Came home from rehab 3 days ago.

## 2017-07-26 NOTE — ED Notes (Signed)
Pt refused vital signs upon getting off of ems stretcher.

## 2017-07-28 ENCOUNTER — Inpatient Hospital Stay (HOSPITAL_COMMUNITY)
Admission: EM | Admit: 2017-07-28 | Discharge: 2017-08-01 | DRG: 919 | Disposition: A | Discharge status: Home Health | Payer: Medicaid Other | Attending: Internal Medicine | Admitting: Internal Medicine

## 2017-07-28 ENCOUNTER — Emergency Department (HOSPITAL_COMMUNITY): Payer: Medicaid Other

## 2017-07-28 ENCOUNTER — Encounter (HOSPITAL_COMMUNITY): Payer: Self-pay

## 2017-07-28 DIAGNOSIS — I10 Essential (primary) hypertension: Secondary | ICD-10-CM | POA: Diagnosis present

## 2017-07-28 DIAGNOSIS — R000 Tachycardia, unspecified: Secondary | ICD-10-CM

## 2017-07-28 DIAGNOSIS — Z800 Family history of malignant neoplasm of digestive organs: Secondary | ICD-10-CM

## 2017-07-28 DIAGNOSIS — T8131XA Disruption of external operation (surgical) wound, not elsewhere classified, initial encounter: Principal | ICD-10-CM | POA: Diagnosis present

## 2017-07-28 DIAGNOSIS — R1084 Generalized abdominal pain: Secondary | ICD-10-CM

## 2017-07-28 DIAGNOSIS — Z85030 Personal history of malignant carcinoid tumor of large intestine: Secondary | ICD-10-CM

## 2017-07-28 DIAGNOSIS — E43 Unspecified severe protein-calorie malnutrition: Secondary | ICD-10-CM | POA: Diagnosis present

## 2017-07-28 DIAGNOSIS — Z923 Personal history of irradiation: Secondary | ICD-10-CM

## 2017-07-28 DIAGNOSIS — F1410 Cocaine abuse, uncomplicated: Secondary | ICD-10-CM | POA: Diagnosis present

## 2017-07-28 DIAGNOSIS — Z9221 Personal history of antineoplastic chemotherapy: Secondary | ICD-10-CM

## 2017-07-28 DIAGNOSIS — F17210 Nicotine dependence, cigarettes, uncomplicated: Secondary | ICD-10-CM | POA: Diagnosis present

## 2017-07-28 DIAGNOSIS — Z9119 Patient's noncompliance with other medical treatment and regimen: Secondary | ICD-10-CM

## 2017-07-28 DIAGNOSIS — L03317 Cellulitis of buttock: Secondary | ICD-10-CM | POA: Diagnosis present

## 2017-07-28 DIAGNOSIS — E876 Hypokalemia: Secondary | ICD-10-CM | POA: Diagnosis present

## 2017-07-28 DIAGNOSIS — E860 Dehydration: Secondary | ICD-10-CM | POA: Diagnosis present

## 2017-07-28 DIAGNOSIS — Z681 Body mass index (BMI) 19 or less, adult: Secondary | ICD-10-CM

## 2017-07-28 DIAGNOSIS — Z933 Colostomy status: Secondary | ICD-10-CM

## 2017-07-28 DIAGNOSIS — Z85040 Personal history of malignant carcinoid tumor of rectum: Secondary | ICD-10-CM

## 2017-07-28 DIAGNOSIS — Z808 Family history of malignant neoplasm of other organs or systems: Secondary | ICD-10-CM

## 2017-07-28 DIAGNOSIS — T8130XA Disruption of wound, unspecified, initial encounter: Secondary | ICD-10-CM | POA: Diagnosis present

## 2017-07-28 DIAGNOSIS — F329 Major depressive disorder, single episode, unspecified: Secondary | ICD-10-CM | POA: Diagnosis present

## 2017-07-28 DIAGNOSIS — Z608 Other problems related to social environment: Secondary | ICD-10-CM

## 2017-07-28 DIAGNOSIS — Z85048 Personal history of other malignant neoplasm of rectum, rectosigmoid junction, and anus: Secondary | ICD-10-CM

## 2017-07-28 DIAGNOSIS — E871 Hypo-osmolality and hyponatremia: Secondary | ICD-10-CM | POA: Diagnosis present

## 2017-07-28 DIAGNOSIS — N3000 Acute cystitis without hematuria: Secondary | ICD-10-CM

## 2017-07-28 DIAGNOSIS — D509 Iron deficiency anemia, unspecified: Secondary | ICD-10-CM | POA: Diagnosis present

## 2017-07-28 DIAGNOSIS — K6289 Other specified diseases of anus and rectum: Secondary | ICD-10-CM | POA: Diagnosis present

## 2017-07-28 DIAGNOSIS — F1910 Other psychoactive substance abuse, uncomplicated: Secondary | ICD-10-CM

## 2017-07-28 DIAGNOSIS — F1010 Alcohol abuse, uncomplicated: Secondary | ICD-10-CM | POA: Diagnosis present

## 2017-07-28 DIAGNOSIS — Z880 Allergy status to penicillin: Secondary | ICD-10-CM

## 2017-07-28 DIAGNOSIS — D638 Anemia in other chronic diseases classified elsewhere: Secondary | ICD-10-CM | POA: Diagnosis present

## 2017-07-28 LAB — CBC WITH DIFFERENTIAL/PLATELET
BASOS ABS: 0 10*3/uL (ref 0.0–0.1)
BASOS PCT: 0 %
Eosinophils Absolute: 0.1 10*3/uL (ref 0.0–0.7)
Eosinophils Relative: 2 %
HEMATOCRIT: 28.6 % — AB (ref 36.0–46.0)
HEMOGLOBIN: 9.5 g/dL — AB (ref 12.0–15.0)
Lymphocytes Relative: 13 %
Lymphs Abs: 0.6 10*3/uL — ABNORMAL LOW (ref 0.7–4.0)
MCH: 26.9 pg (ref 26.0–34.0)
MCHC: 33.2 g/dL (ref 30.0–36.0)
MCV: 81 fL (ref 78.0–100.0)
MONO ABS: 0.4 10*3/uL (ref 0.1–1.0)
Monocytes Relative: 8 %
NEUTROS ABS: 3.5 10*3/uL (ref 1.7–7.7)
NEUTROS PCT: 77 %
Platelets: 217 10*3/uL (ref 150–400)
RBC: 3.53 MIL/uL — AB (ref 3.87–5.11)
RDW: 17.5 % — AB (ref 11.5–15.5)
WBC: 4.6 10*3/uL (ref 4.0–10.5)

## 2017-07-28 LAB — PROTIME-INR
INR: 1.15
PROTHROMBIN TIME: 14.6 s (ref 11.4–15.2)

## 2017-07-28 LAB — COMPREHENSIVE METABOLIC PANEL
ALK PHOS: 62 U/L (ref 38–126)
ALT: 13 U/L — AB (ref 14–54)
ANION GAP: 10 (ref 5–15)
AST: 15 U/L (ref 15–41)
Albumin: 2.3 g/dL — ABNORMAL LOW (ref 3.5–5.0)
BILIRUBIN TOTAL: 0.5 mg/dL (ref 0.3–1.2)
BUN: <5 mg/dL — ABNORMAL LOW (ref 6–20)
CO2: 22 mmol/L (ref 22–32)
Calcium: 8.1 mg/dL — ABNORMAL LOW (ref 8.9–10.3)
Chloride: 97 mmol/L — ABNORMAL LOW (ref 101–111)
Creatinine, Ser: 0.39 mg/dL — ABNORMAL LOW (ref 0.44–1.00)
GFR calc non Af Amer: >60 mL/min (ref >60)
Glucose, Bld: 90 mg/dL (ref 65–99)
Potassium: 2.9 mmol/L — ABNORMAL LOW (ref 3.5–5.1)
Sodium: 129 mmol/L — ABNORMAL LOW (ref 135–145)
TOTAL PROTEIN: 6.2 g/dL — AB (ref 6.5–8.1)

## 2017-07-28 LAB — URINALYSIS, ROUTINE W REFLEX MICROSCOPIC
BILIRUBIN URINE: NEGATIVE
GLUCOSE, UA: NEGATIVE mg/dL
Ketones, ur: NEGATIVE mg/dL
Nitrite: NEGATIVE
PH: 5 (ref 5.0–8.0)
Protein, ur: NEGATIVE mg/dL
SPECIFIC GRAVITY, URINE: 1.008 (ref 1.005–1.030)
SQUAMOUS EPITHELIAL / LPF: NONE SEEN

## 2017-07-28 LAB — LIPASE, BLOOD: Lipase: 24 U/L (ref 11–51)

## 2017-07-28 LAB — I-STAT CG4 LACTIC ACID, ED: LACTIC ACID, VENOUS: 1.48 mmol/L (ref 0.5–1.9)

## 2017-07-28 MED ORDER — HYDROMORPHONE HCL 1 MG/ML IJ SOLN
1.0000 mg | Freq: Once | INTRAMUSCULAR | Status: AC
  Administered 2017-07-28: 1 mg via INTRAVENOUS
  Filled 2017-07-28: qty 1

## 2017-07-28 MED ORDER — DEXTROSE 5 % IV SOLN
1.0000 g | Freq: Three times a day (TID) | INTRAVENOUS | Status: DC
  Administered 2017-07-28 – 2017-08-01 (×12): 1 g via INTRAVENOUS
  Filled 2017-07-28 (×12): qty 1

## 2017-07-28 MED ORDER — ACETAMINOPHEN 325 MG PO TABS
650.0000 mg | ORAL_TABLET | Freq: Four times a day (QID) | ORAL | Status: DC | PRN
  Administered 2017-07-28 – 2017-08-01 (×6): 650 mg via ORAL
  Filled 2017-07-28 (×6): qty 2

## 2017-07-28 MED ORDER — DEXTROSE 5 % IV SOLN: 1.0000 g | INTRAVENOUS | Status: DC

## 2017-07-28 MED ORDER — POTASSIUM CHLORIDE CRYS ER 20 MEQ PO TBCR
40.0000 meq | EXTENDED_RELEASE_TABLET | Freq: Once | ORAL | Status: AC
  Administered 2017-07-28: 40 meq via ORAL
  Filled 2017-07-28: qty 2

## 2017-07-28 MED ORDER — SODIUM CHLORIDE 0.9 % IV BOLUS (SEPSIS)
1000.0000 mL | Freq: Once | INTRAVENOUS | Status: AC
  Administered 2017-07-28: 1000 mL via INTRAVENOUS

## 2017-07-28 MED ORDER — SODIUM CHLORIDE 0.9% FLUSH
3.0000 mL | Freq: Two times a day (BID) | INTRAVENOUS | Status: DC
  Administered 2017-07-29 – 2017-08-01 (×7): 3 mL via INTRAVENOUS

## 2017-07-28 MED ORDER — SODIUM CHLORIDE 0.9 % IV SOLN
INTRAVENOUS | Status: AC
  Administered 2017-07-28: 20:00:00 via INTRAVENOUS

## 2017-07-28 MED ORDER — HYDROMORPHONE HCL 1 MG/ML IJ SOLN
0.5000 mg | Freq: Once | INTRAMUSCULAR | Status: AC
  Administered 2017-07-28: 0.5 mg via INTRAVENOUS
  Filled 2017-07-28: qty 1

## 2017-07-28 MED ORDER — DAKINS (1/4 STRENGTH) 0.125 % EX SOLN
Freq: Two times a day (BID) | CUTANEOUS | Status: DC
  Administered 2017-07-29 – 2017-07-31 (×3)
  Filled 2017-07-28: qty 473

## 2017-07-28 MED ORDER — ACETAMINOPHEN 650 MG RE SUPP: 650.0000 mg | Freq: Four times a day (QID) | RECTAL | Status: DC | PRN

## 2017-07-28 MED ORDER — VANCOMYCIN HCL 500 MG IV SOLR
500.0000 mg | Freq: Two times a day (BID) | INTRAVENOUS | Status: DC
  Administered 2017-07-29 – 2017-07-31 (×6): 500 mg via INTRAVENOUS
  Filled 2017-07-28 (×6): qty 500

## 2017-07-28 MED ORDER — DEXTROSE 5 % IV SOLN
1.0000 g | Freq: Once | INTRAVENOUS | Status: AC
  Administered 2017-07-28: 1 g via INTRAVENOUS
  Filled 2017-07-28: qty 10

## 2017-07-28 MED ORDER — OXYCODONE HCL 5 MG PO TABS
5.0000 mg | ORAL_TABLET | Freq: Four times a day (QID) | ORAL | Status: DC | PRN
  Administered 2017-07-29 – 2017-08-01 (×11): 5 mg via ORAL
  Filled 2017-07-28 (×12): qty 1

## 2017-07-28 MED ORDER — HYDROMORPHONE HCL 1 MG/ML IJ SOLN
INTRAMUSCULAR | Status: AC
  Filled 2017-07-28: qty 1

## 2017-07-28 MED ORDER — HEPARIN SODIUM (PORCINE) 5000 UNIT/ML IJ SOLN
5000.0000 [IU] | Freq: Three times a day (TID) | INTRAMUSCULAR | Status: DC
  Administered 2017-07-28 – 2017-08-01 (×12): 5000 [IU] via SUBCUTANEOUS
  Filled 2017-07-28 (×12): qty 1

## 2017-07-28 MED ORDER — PROMETHAZINE HCL 25 MG PO TABS: 12.5000 mg | ORAL_TABLET | Freq: Four times a day (QID) | ORAL | Status: DC | PRN

## 2017-07-28 NOTE — Consult Note (Signed)
[facility] Nurse wound consult note Reason for Consult: surgical wound See extensive history, patient left SNF facility AMA, home with roommate in [place] and ran out of ostomy supplies. Was "doing wound care herself". She has no insurance and was scheduled to have [facility] but she reports "they never came" Wound type: Surgical s/p APR for rectal CA, wound dehiscence.  Pressure Injury POA:/No Measurement: distal 10cm x 5cm x 6cm with skin bridge proximal wound 3cm x 2.0cm x 3cm  Wound bed: pale, slick, non granular, grey base,  Drainage (amount, consistency, odor) foul odor, thin frothy yellow drainage  Periwound: erythema surrounds wound  Dressing procedure/placement/frequency:  Requested ED MD to consult surgery, no imaging performed WOC has ordered 1/4% Dakins for odor and to clean this up a bit. Will reeval in 3 days for change to routine saline packing When I questioned who at home can learn to do dressing she reports she was doing herself "by feeling"  I do not think she is able to do the adequately    [facility] Nurse ostomy consult note Stoma type/location: LLQ end colostomy Stomal assessment/size: 1 5/8" round, budded, pink and moist Peristomal assessment: denudation that extends 6cm circumferentially that is weeping, extension onto her left thigh but this area is not weeping currently Treatment options for stomal/peristomal skin: crusted skin with ostomy pectin powder and no sting skin prep Output did not see any during both of my times in the rooms Ostomy pouching: [place] pouch to suction for now to attempt to rest her skin for at least 24 hours.   Education provided:  Discussed at length with patient the need for accountability for follow up for indigent services, provided her with catalog and contact for [employer] program   will need SW/CM to assist with her MCD application, she reports she filed in March of this year. Explained that when she left the SNF AMA that [facility] does not assume  responsibility for her, she seems to understand this She has history and drug and alcohol abuse, I did not assess if this is still a problem Enrolled patient in [place] program: Yes  WOC will follow along with you for support with wound and ostomy Franklin Sigurd, [place], [place]

## 2017-07-28 NOTE — ED Notes (Signed)
PA surgeon at bedside assessing the pt

## 2017-07-28 NOTE — Progress Notes (Signed)
Received report on pt.

## 2017-07-28 NOTE — ED Provider Notes (Signed)
[facility] DEPT Provider Note   CSN: [id number] Arrival date & time: 07/28/17  0251     History   Chief Complaint Chief Complaint  Patient presents with  . Wound Check    HPI Sherry Baker is a 51 y.o. female.  The history is provided by the patient and medical records. No language interpreter was used.  Wound Check  This is a recurrent problem. The current episode started more than 2 days ago. The problem occurs constantly. The problem has been gradually worsening. Associated symptoms include abdominal pain. Pertinent negatives include no chest pain, no headaches and no shortness of breath. The symptoms are aggravated by twisting and bending. Nothing relieves the symptoms. She has tried nothing for the symptoms. The treatment provided no relief.    Past Medical History:  Diagnosis Date  . Alcohol abuse   . Allergy    PCNS swelling  . Arthritis   . Bipolar 1 disorder ([place])   . Cancer ([place]) 01/21/2017   rectal cancer  . Cirrhosis of liver ([place])   . Depression   . Genetic testing 03/24/2017   Ms. Sonnen underwent genetic counseling and testing for hereditary cancer syndromes on 02/17/2017. Her results were negative for mutations in all 46 genes analyzed by [facility]'s 46-gene Common Hereditary Cancers Panel. Genes analyzed include: APC, ATM, AXIN2, BARD1, BMPR1A, BRCA1, BRCA2, BRIP1, CDH1, CDKN2A, CHEK2, CTNNA1, DICER1, EPCAM, GREM1, HOXB13, KIT, MEN1, MLH1, MSH2, MSH3, MSH6, MUTYH, NBN,  . Hypertension     Patient Active Problem List   Diagnosis Date Noted  . Wound dehiscence 07/10/2017  . Genetic testing 03/24/2017  . Hypertension 02/18/2017  . GERD (gastroesophageal reflux disease) 02/18/2017  . Rectal cancer ([place]) 01/27/2017  . Abdominal pain, epigastric   . Rectal mass   . Fever   . Anemia 01/16/2017  . Abdominal pain   . Constipation   . Alcohol abuse   . Tobacco abuse   . Cocaine abuse   . Bipolar affective disorder ([place])   . Protein-calorie malnutrition,  severe ([place]) 02/13/2014  . Suicide ideation 02/12/2014  . Suicidal ideation 02/11/2014  . Alcohol intoxication ([place]) 02/11/2014  . Transaminitis 02/11/2014  . Substance abuse 02/11/2014  . Depression 02/11/2014  . Thrombocytopenia ([place]) 02/11/2014    Past Surgical History:  Procedure Laterality Date  . ABDOMINAL PERINEAL BOWEL RESECTION N/A 06/18/2017   Procedure: ABDOMINAL PERINEAL RESECTION ERAS PATHWAY;  Surgeon: Leighton Ruff, MD;  Location: [facility];  Service: General;  Laterality: N/A;  . COLONOSCOPY WITH PROPOFOL Left 01/21/2017   Procedure: COLONOSCOPY WITH PROPOFOL;  Surgeon: Otis Brace, MD;  Location: [facility];  Service: Gastroenterology;  Laterality: Left;  Marland Kitchen MANDIBLE FRACTURE SURGERY      OB History    No data available       Home Medications    Prior to Admission medications   Medication Sig Start Date End Date Taking? Authorizing Provider  Amino Acids-Protein Hydrolys (FEEDING SUPPLEMENT, PRO-STAT SUGAR FREE 64,) LIQD Take 30 mLs by mouth 2 (two) times daily. 9/39/03   Leighton Ruff, MD  carvedilol (COREG) 6.25 MG tablet Take 1 tablet (6.25 mg total) by mouth 2 (two) times daily with a meal. 0/09/23   Leighton Ruff, MD  docusate sodium (COLACE) 100 MG capsule Take 1 capsule (100 mg total) by mouth 2 (two) times daily. 3/00/76   Leighton Ruff, MD  feeding supplement, ENSURE ENLIVE, (ENSURE ENLIVE) LIQD Take 237 mLs by mouth 3 (three) times daily between meals. 12/30/31   Leighton Ruff, MD  ferrous sulfate 325 (65 FE) MG tablet Take 1 tablet (325 mg total) by mouth 2 (two) times daily with a meal. 05/03/15   Leighton Ruff, MD  FLUoxetine (PROZAC) 20 MG tablet Take 1 tablet (20 mg total) by mouth daily. 0/10/93   Leighton Ruff, MD  HYDROmorphone (DILAUDID) 2 MG tablet Take 1 tablet (2 mg total) by mouth every 6 (six) hours as needed for severe pain. 07/21/17   Virgie Dad, MD  Multiple Vitamin (MULTIVITAMIN WITH MINERALS) TABS tablet Take 1 tablet by mouth  daily. 2/35/57   Leighton Ruff, MD  nicotine (NICODERM CQ - DOSED IN MG/24 HR) 7 mg/24hr patch Place 1 patch (7 mg total) onto the skin daily. 01/22/01   Leighton Ruff, MD  pantoprazole (PROTONIX) 40 MG tablet Take 1 tablet (40 mg total) by mouth daily. 5/42/70   Leighton Ruff, MD  potassium chloride SA (K-DUR,KLOR-CON) 20 MEQ tablet Take 1 tablet (20 mEq total) by mouth 2 (two) times daily. 04/26/75   Leighton Ruff, MD    Family History Family History  Problem Relation Age of Onset  . Cancer Mother 76       Originating in the abdomen, otherwise unknwon  . Cancer - Other Maternal Aunt        Throat    Social History Social History  Substance Use Topics  . Smoking status: Current Every Day Smoker    Packs/day: 0.50    Years: 41.00    Types: Cigarettes  . Smokeless tobacco: Former Systems developer     Comment: 7 cigs daily  . Alcohol use 1.2 oz/week    2 Cans of beer per week     Comment: last drink two weeks ago     Allergies   Penicillins   Review of Systems Review of Systems  Constitutional: Positive for chills. Negative for diaphoresis, fatigue and fever.  HENT: Negative for congestion.   Respiratory: Positive for cough. Negative for chest tightness, shortness of breath, wheezing and stridor.   Cardiovascular: Negative for chest pain and palpitations.  Gastrointestinal: Positive for abdominal pain and rectal pain. Negative for diarrhea, nausea and vomiting.  Genitourinary: Negative for difficulty urinating and dysuria.  Musculoskeletal: Positive for back pain. Negative for neck pain and neck stiffness.  Skin: Positive for rash and wound.  Neurological: Negative for light-headedness and headaches.  Psychiatric/Behavioral: Negative for agitation.  All other systems reviewed and are negative.    Physical Exam Updated Vital Signs BP (!) 151/99 (BP Location: Left Arm)   Pulse 88   Temp 98.5 F (36.9 C) (Oral)   Resp 16   SpO2 98%   Physical Exam  Constitutional: She is  oriented to person, place, and time. She appears well-developed and well-nourished. No distress.  HENT:  Head: Normocephalic.  Mouth/Throat: Oropharynx is clear and moist. No oropharyngeal exudate.  Eyes: Pupils are equal, round, and reactive to light. Conjunctivae and EOM are normal.  Neck: Normal range of motion.  Cardiovascular: Intact distal pulses.  Tachycardia present.   No murmur heard. Pulmonary/Chest: Effort normal. No stridor. Tachypnea noted. No respiratory distress. She has no wheezes. She has no rhonchi. She has no rales. She exhibits no tenderness.  Abdominal: There is generalized tenderness. There is no rigidity and no guarding.    Genitourinary:     Musculoskeletal: She exhibits tenderness.  Neurological: She is alert and oriented to person, place, and time. No sensory deficit. She exhibits normal muscle tone.  Skin: Capillary refill takes less than 2 seconds. Rash  noted. She is not diaphoretic. There is erythema.  Nursing note and vitals reviewed.         ED Treatments / Results  Labs (all labs ordered are listed, but only abnormal results are displayed) Labs Reviewed  URINE CULTURE - Abnormal; Notable for the following:       Result Value   Culture MULTIPLE SPECIES PRESENT, SUGGEST RECOLLECTION (*)    All other components within normal limits  COMPREHENSIVE METABOLIC PANEL - Abnormal; Notable for the following:    Sodium 129 (*)    Potassium 2.9 (*)    Chloride 97 (*)    BUN <5 (*)    Creatinine, Ser 0.39 (*)    Calcium 8.1 (*)    Total Protein 6.2 (*)    Albumin 2.3 (*)    ALT 13 (*)    All other components within normal limits  CBC WITH DIFFERENTIAL/PLATELET - Abnormal; Notable for the following:    RBC 3.53 (*)    Hemoglobin 9.5 (*)    HCT 28.6 (*)    RDW 17.5 (*)    Lymphs Abs 0.6 (*)    All other components within normal limits  URINALYSIS, ROUTINE W REFLEX MICROSCOPIC - Abnormal; Notable for the following:    APPearance HAZY (*)    Hgb  urine dipstick SMALL (*)    Leukocytes, UA LARGE (*)    Bacteria, UA RARE (*)    All other components within normal limits  CBC - Abnormal; Notable for the following:    RBC 3.18 (*)    Hemoglobin 8.5 (*)    HCT 26.3 (*)    RDW 17.6 (*)    All other components within normal limits  COMPREHENSIVE METABOLIC PANEL - Abnormal; Notable for the following:    Sodium 130 (*)    Glucose, Bld 112 (*)    BUN <5 (*)    Creatinine, Ser 0.37 (*)    Calcium 7.8 (*)    Total Protein 5.6 (*)    Albumin 1.9 (*)    AST 14 (*)    ALT 12 (*)    All other components within normal limits  CULTURE, BLOOD (ROUTINE X 2)  CULTURE, BLOOD (ROUTINE X 2)  AEROBIC/ANAEROBIC CULTURE (SURGICAL/DEEP WOUND)  MRSA PCR SCREENING  LIPASE, BLOOD  PROTIME-INR  GLUCOSE, CAPILLARY  BASIC METABOLIC PANEL  OSMOLALITY  CBC  I-STAT CG4 LACTIC ACID, ED  I-STAT CG4 LACTIC ACID, ED    EKG  EKG Interpretation None       Radiology Dg Chest Portable 1 View  Result Date: 07/28/2017 CLINICAL DATA:  Cough, chills, tachycardia. EXAM: PORTABLE CHEST 1 VIEW COMPARISON:  CT 01/19/2017.  Chest x-ray 01/17/2017. FINDINGS: Mediastinum hilar structures normal. Heart size normal. No focal infiltrate. No pleural effusion or pneumothorax. Mild thoracic spine scoliosis. No acute bony abnormality. IMPRESSION: No acute cardiopulmonary disease. Electronically Signed   By: Marcello Moores  Register   On: 07/28/2017 09:47    Procedures Procedures (including critical care time)  Medications Ordered in ED Medications  sodium hypochlorite (DAKIN'S 1/4 STRENGTH) topical solution ( Irrigation Given 07/29/17 0134)  heparin injection 5,000 Units (5,000 Units Subcutaneous Given 07/29/17 1731)  sodium chloride flush (NS) 0.9 % injection 3 mL (3 mLs Intravenous Given 07/29/17 0822)  0.9 %  sodium chloride infusion ( Intravenous New Bag/Given 07/28/17 1940)  acetaminophen (TYLENOL) tablet 650 mg (650 mg Oral Given 07/29/17 0821)    Or  acetaminophen  (TYLENOL) suppository 650 mg ( Rectal See Alternative 07/29/17 6269)  promethazine (PHENERGAN) tablet 12.5 mg (not administered)  oxyCODONE (Oxy IR/ROXICODONE) immediate release tablet 5 mg (5 mg Oral Given 07/29/17 1732)  vancomycin (VANCOCIN) 500 mg in sodium chloride 0.9 % 100 mL IVPB (0 mg Intravenous Stopped 07/29/17 0922)  ceFEPIme (MAXIPIME) 1 g in dextrose 5 % 50 mL IVPB (1 g Intravenous New Bag/Given 07/29/17 1732)  HYDROmorphone (DILAUDID) injection 1 mg (1 mg Intravenous Given 07/29/17 2009)  multivitamin with minerals tablet 1 tablet (1 tablet Oral Given 07/29/17 2009)  feeding supplement (ENSURE ENLIVE) (ENSURE ENLIVE) liquid 237 mL (237 mLs Oral Given 07/29/17 2009)  HYDROmorphone (DILAUDID) injection 1 mg (1 mg Intravenous Given 07/28/17 0806)  sodium chloride 0.9 % bolus 1,000 mL (0 mLs Intravenous Stopped 07/28/17 1116)  potassium chloride SA (K-DUR,KLOR-CON) CR tablet 40 mEq (40 mEq Oral Given 07/28/17 1114)  cefTRIAXone (ROCEPHIN) 1 g in dextrose 5 % 50 mL IVPB (0 g Intravenous Stopped 07/28/17 1343)  HYDROmorphone (DILAUDID) injection 1 mg (1 mg Intravenous Given 07/28/17 1343)  potassium chloride SA (K-DUR,KLOR-CON) CR tablet 40 mEq (40 mEq Oral Given 07/28/17 2049)  HYDROmorphone (DILAUDID) injection 0.5 mg (0.5 mg Intravenous Given 07/28/17 2226)     Initial Impression / Assessment and Plan / ED Course  I have reviewed the triage vital signs and the nursing notes.  Pertinent labs & imaging results that were available during my care of the patient were reviewed by me and considered in my medical decision making (see chart for details).     Sherry Baker is a 51 y.o. female past medical history significant for hypertension, cirrhosis, bipolar disorder, and rectal cancer status post abdominal surgery on 06/18/17 who was admitted for wound dehiscence and discharge last week who presents with ostomy problems,rectal pain, wound pain, abdominal pain, chills, malaise, and cough. Patient  reports she was discharged last week and was discharged to a rehabilitation facility. She says that it was "all she expected" and she left there. She reports that she was already here for one day before leaving and having a friend pick her up.She says she is now staying with a roommate. Of note, patient had bedbugs all over her bed during on my initial evaluation. Contact precautions were placed by nursing.  Patient describes that she has had worsening pain at her surgical site and it continues to drain purulence. She has worsening pain and redness of his location. She says she is having severe, 10 out of 10 abdominal pain near her ostomy. She says that there has been foul-smelling purulence coming from near the ostomy. She says that she has had no ostomy bag and has been keeping it covered and taped. She says that her pain is burning as well as aching and sharp. She says that nothing makes it better. She denies any urinary symptoms but does report the cough. She has no nausea and no vomiting. She denies other complaints on arrival.  On my exam, patient had an ABG over her ostomy. The skin was very irritated and erythematous. There was tenderness surrounding. There were some small areas of purulence but no palpable abscesses felt. Patient's posterior wound was examined and it was completely dehisced with purulence inside. There was surrounding erythema for several centimeters with tenderness. Patient's abdomen was otherwise tender in all locations. No CVA tenderness. Lungs were clear on my exam. Chest was nontender. Patient had bedbugs crawling on her clothing.  Given the concern for recent discharge after surgical procedure with these symptoms, patient was worked up for infection or  red patient will be given pain medicine and fluids to try and alleviate her tachycardia on arrival. She'll have x-ray for the cough and urinalysis. Anticipate speaking with general surgery to determine further management.      11:55 AM General surgery assessed the patient and the provider reports that they will not be admitting the patient given her history of noncompliance and leaving AMA from her rehabilitation facility.    I am still concerned about the patient having a foul-smelling and purulent wound on her back, with chills, tachycardia, and severe pain. Patient is continuing to have severe pain in her abdomen and back. Although patient's lactic acid is not elevated and her white blood cell count was not elevated, I'm still concerned about infection. Patient also appears to have urinary tract infection given the abdominal pain.   She was also found to have hypokalemia which potassium supplementation was ordered. She is hyponatremic likely due to poor by mouth intake, and also has some mild anemia that is improving prior. Chest x-ray did not show pneumonia.  Pharmacy was called to discuss antibiotics for the wound cellulitis and UTI. They recommended Rocephin which the patient has tolerated several sports in the past. This was ordered.  Wound care also assessed the patient and expressed concern about the patient's ability to take care of her wound at this time. They agreed to follow the patient after admission for continued wound management for both the ostomy and the surgical site.    Final Clinical Impressions(s) / ED Diagnoses   Final diagnoses:  Acute cystitis without hematuria  Cellulitis of buttock  Generalized abdominal pain  Hyponatremia  Hypokalemia  Tachycardia     Clinical Impression: 1. Acute cystitis without hematuria   2. Cellulitis of buttock   3. Generalized abdominal pain   4. Hyponatremia   5. Hypokalemia   6. Tachycardia     Disposition: Admit to [facility]    Tegeler, Gwenyth Allegra, MD 07/29/17 2036

## 2017-07-28 NOTE — ED Notes (Signed)
Purewick placed on pt. 

## 2017-07-28 NOTE — Progress Notes (Addendum)
Pharmacy Antibiotic Note  Sherry Baker is a 51 y.o. female admitted on 07/28/2017 with wound infection and possible UTI.  Pharmacy has been consulted for ceftriaxone, vancomycin and cefepime dosing.  Plan: Ceftriaxone 1 g IV daily.  Vancomycin 500 mg IV q 12 hrs Cefepime 1g IV q 8 hrs. F/u cultures, renal function and clinical course.  Height: 5\' 2"  (157.5 cm) Weight: 106 lb 11.2 oz (48.4 kg) IBW/kg (Calculated) : 50.1  Temp (24hrs), Avg:98.6 F (37 C), Min:98.3 F (36.8 C), Max:99 F (37.2 C)   Recent Labs Lab 07/28/17 0308 07/28/17 0334  WBC 4.6  --   CREATININE 0.39*  --   LATICACIDVEN  --  1.48    Estimated Creatinine Clearance: 65 mL/min (A) (by C-G formula based on SCr of 0.39 mg/dL (L)).    Allergies  Allergen Reactions  . Penicillins Hives and Swelling    Has patient had a PCN reaction causing immediate rash, facial/tongue/throat swelling, SOB or lightheadedness with hypotension: Yes Has patient had a PCN reaction causing severe rash involving mucus membranes or skin necrosis: No Has patient had a PCN reaction that required hospitalization No Has patient had a PCN reaction occurring within the last 10 years: Yes If all of the above answers are "NO", then may proceed with Cephalosporin use.     Antimicrobials this admission: Ceftriaxone 9/25 >>  Vancomycin 9/25 > Cefepime 9/25 >  Dose adjustments this admission:   Microbiology results: 9/25 BCx:  9/25 UCx:   Sputum:    MRSA PCR:   Thank you for allowing pharmacy to be a part of this patient's care.  Uvaldo Rising, BCPS  Clinical Pharmacist Pager [phone]  07/28/2017 7:18 PM

## 2017-07-28 NOTE — ED Notes (Signed)
Lunch tray ordered for pt.

## 2017-07-28 NOTE — Progress Notes (Signed)
Reason for Consult: post op wound check CC:  Redness and burning at the ostomy site, out of bags, pain and drainage from open perineal site.   Referring Physician: C Tegeler   Sherry Baker is an 51 y.o. female.  HPI: 51 y/o with rectal cancer who has undergone Chemotherapy, radiation therapy, Abdominal perioneal resection on 06/18/17 and was discharged home on 06/22/17.  She returned on 07/10/17 with fall and wound drainage.  She also admitted to using cocaine and beer prior to admit.  She was found to have a wound dehiscence in the perineum, with odor, superficial wound separation of the abdominal wall.  She was ultimately discharged on 07/21/17 and sent to SNF.  She signed out AMA after 24 hours and presents with no supplies open colostomy, wound issues, bed bugs reported by ED staff, hyponatremia, hypokalemia and uncontrolled pain to the rectal area.      In the ED she complains of painful rectum and bottom.  She is afebrile, tachycardic, BP is up.  Na is 129, K+ 2.9, creatinine is 0.39.  WBC is 4.6, H/H is 9.5/28.6, and platelets 217K.  UA is negative nitrates, but TNTC WBC.       Past Medical History:  Diagnosis Date  . Alcohol abuse    . Allergy      PCNS swelling  . Arthritis    . Bipolar 1 disorder ([place])    . Cancer ([place]) 01/21/2017    rectal cancer  . Cirrhosis of liver ([place])    . Depression    . Genetic testing 03/24/2017    Ms. Consolo underwent genetic counseling and testing for hereditary cancer syndromes on 02/17/2017. Her results were negative for mutations in all 46 genes analyzed by [facility]'s 46-gene Common Hereditary Cancers Panel. Genes analyzed include: APC, ATM, AXIN2, BARD1, BMPR1A, BRCA1, BRCA2, BRIP1, CDH1, CDKN2A, CHEK2, CTNNA1, DICER1, EPCAM, GREM1, HOXB13, KIT, MEN1, MLH1, MSH2, MSH3, MSH6, MUTYH, NBN,  . Hypertension             Past Surgical History:  Procedure Laterality Date  . ABDOMINAL PERINEAL BOWEL RESECTION N/A 06/18/2017    Procedure: ABDOMINAL PERINEAL  RESECTION ERAS PATHWAY;  Surgeon: Leighton Ruff, MD;  Location: [facility];  Service: General;  Laterality: N/A;  . COLONOSCOPY WITH PROPOFOL Left 01/21/2017    Procedure: COLONOSCOPY WITH PROPOFOL;  Surgeon: Otis Brace, MD;  Location: [facility];  Service: Gastroenterology;  Laterality: Left;  Marland Kitchen MANDIBLE FRACTURE SURGERY               Family History  Problem Relation Age of Onset  . Cancer Mother 7        Originating in the abdomen, otherwise unknwon  . Cancer - Other Maternal Aunt          Throat      Social History:  reports that she has been smoking Cigarettes.  She has a 20.50 pack-year smoking history. She has quit using smokeless tobacco. She reports that she drinks about 1.2 oz of alcohol per week . She reports that she uses drugs, including Marijuana, "Crack" cocaine, and Cocaine.   Allergies:       Allergies  Allergen Reactions  . Penicillins Hives and Swelling      Has patient had a PCN reaction causing immediate rash, facial/tongue/throat swelling, SOB or lightheadedness with hypotension: Yes Has patient had a PCN reaction causing severe rash involving mucus membranes or skin necrosis: No Has patient had a PCN reaction that required hospitalization No Has patient  had a PCN reaction occurring within the last 10 years: Yes If all of the above answers are "NO", then may proceed with Cephalosporin use.               Prior to Admission medications   Medication Sig Start Date End Date Taking? Authorizing Provider  Aspirin-Salicylamide-Caffeine (BC HEADACHE POWDER PO) Take 1 packet by mouth daily as needed (headache).     Yes [provider]  naproxen sodium (ANAPROX) 220 MG tablet Take 220 mg by mouth 2 (two) times daily as needed (pain).     Yes [provider]  Amino Acids-Protein Hydrolys (FEEDING SUPPLEMENT, PRO-STAT SUGAR FREE 64,) LIQD Take 30 mLs by mouth 2 (two) times daily. Patient not taking: Reported on 07/28/2017 7/37/10     Leighton Ruff,  MD  carvedilol (COREG) 6.25 MG tablet Take 1 tablet (6.25 mg total) by mouth 2 (two) times daily with a meal. Patient not taking: Reported on 07/28/2017 04/28/93     Leighton Ruff, MD  docusate sodium (COLACE) 100 MG capsule Take 1 capsule (100 mg total) by mouth 2 (two) times daily. Patient not taking: Reported on 07/28/2017 8/54/62     Leighton Ruff, MD  feeding supplement, ENSURE ENLIVE, (ENSURE ENLIVE) LIQD Take 237 mLs by mouth 3 (three) times daily between meals. Patient not taking: Reported on 07/28/2017 05/05/49     Leighton Ruff, MD  ferrous sulfate 325 (65 FE) MG tablet Take 1 tablet (325 mg total) by mouth 2 (two) times daily with a meal. Patient not taking: Reported on 07/28/2017 0/93/81     Leighton Ruff, MD  FLUoxetine (PROZAC) 20 MG tablet Take 1 tablet (20 mg total) by mouth daily. Patient not taking: Reported on 07/28/2017 07/01/92     Leighton Ruff, MD  HYDROmorphone (DILAUDID) 2 MG tablet Take 1 tablet (2 mg total) by mouth every 6 (six) hours as needed for severe pain. Patient not taking: Reported on 07/28/2017 07/21/17     Virgie Dad, MD  Multiple Vitamin (MULTIVITAMIN WITH MINERALS) TABS tablet Take 1 tablet by mouth daily. Patient not taking: Reported on 07/28/2017 05/19/95     Leighton Ruff, MD  nicotine (NICODERM CQ - DOSED IN MG/24 HR) 7 mg/24hr patch Place 1 patch (7 mg total) onto the skin daily. Patient not taking: Reported on 07/28/2017 7/89/38     Leighton Ruff, MD  pantoprazole (PROTONIX) 40 MG tablet Take 1 tablet (40 mg total) by mouth daily. Patient not taking: Reported on 07/28/2017 11/03/73     Leighton Ruff, MD  potassium chloride SA (K-DUR,KLOR-CON) 20 MEQ tablet Take 1 tablet (20 mEq total) by mouth 2 (two) times daily. Patient not taking: Reported on 07/28/2017 11/04/56     Leighton Ruff, MD        Lab Results Last 48 Hours        Results for orders placed or performed during the hospital encounter of 07/28/17 (from the past 48 hour(s))  Comprehensive  metabolic panel     Status: Abnormal    Collection Time: 07/28/17  3:08 AM  Result Value Ref Range    Sodium 129 (L) 135 - 145 mmol/L    Potassium 2.9 (L) 3.5 - 5.1 mmol/L    Chloride 97 (L) 101 - 111 mmol/L    CO2 22 22 - 32 mmol/L    Glucose, Bld 90 65 - 99 mg/dL    BUN <5 (L) 6 - 20 mg/dL    Creatinine, Ser 0.39 (L) 0.44 -  1.00 mg/dL    Calcium 8.1 (L) 8.9 - 10.3 mg/dL    Total Protein 6.2 (L) 6.5 - 8.1 g/dL    Albumin 2.3 (L) 3.5 - 5.0 g/dL    AST 15 15 - 41 U/L    ALT 13 (L) 14 - 54 U/L    Alkaline Phosphatase 62 38 - 126 U/L    Total Bilirubin 0.5 0.3 - 1.2 mg/dL    GFR calc non Af Amer >60 >60 mL/min    GFR calc Af Amer >60 >60 mL/min      Comment: (NOTE) The eGFR has been calculated using the CKD EPI equation. This calculation has not been validated in all clinical situations. eGFR's persistently <60 mL/min signify possible Chronic Kidney Disease.      Anion gap 10 5 - 15  CBC with Differential     Status: Abnormal    Collection Time: 07/28/17  3:08 AM  Result Value Ref Range    WBC 4.6 4.0 - 10.5 K/uL    RBC 3.53 (L) 3.87 - 5.11 MIL/uL    Hemoglobin 9.5 (L) 12.0 - 15.0 g/dL    HCT 28.6 (L) 36.0 - 46.0 %    MCV 81.0 78.0 - 100.0 fL    MCH 26.9 26.0 - 34.0 pg    MCHC 33.2 30.0 - 36.0 g/dL    RDW 17.5 (H) 11.5 - 15.5 %    Platelets 217 150 - 400 K/uL    Neutrophils Relative % 77 %    Neutro Abs 3.5 1.7 - 7.7 K/uL    Lymphocytes Relative 13 %    Lymphs Abs 0.6 (L) 0.7 - 4.0 K/uL    Monocytes Relative 8 %    Monocytes Absolute 0.4 0.1 - 1.0 K/uL    Eosinophils Relative 2 %    Eosinophils Absolute 0.1 0.0 - 0.7 K/uL    Basophils Relative 0 %    Basophils Absolute 0.0 0.0 - 0.1 K/uL  I-Stat CG4 Lactic Acid, ED     Status: None    Collection Time: 07/28/17  3:34 AM  Result Value Ref Range    Lactic Acid, Venous 1.48 0.5 - 1.9 mmol/L  Lipase, blood     Status: None    Collection Time: 07/28/17  7:29 AM  Result Value Ref Range    Lipase 24 11 - 51 U/L   Protime-INR     Status: None    Collection Time: 07/28/17  7:29 AM  Result Value Ref Range    Prothrombin Time 14.6 11.4 - 15.2 seconds    INR 1.15    Urinalysis, Routine w reflex microscopic     Status: Abnormal    Collection Time: 07/28/17  8:39 AM  Result Value Ref Range    Color, Urine YELLOW YELLOW    APPearance HAZY (A) CLEAR    Specific Gravity, Urine 1.008 1.005 - 1.030    pH 5.0 5.0 - 8.0    Glucose, UA NEGATIVE NEGATIVE mg/dL    Hgb urine dipstick SMALL (A) NEGATIVE    Bilirubin Urine NEGATIVE NEGATIVE    Ketones, ur NEGATIVE NEGATIVE mg/dL    Protein, ur NEGATIVE NEGATIVE mg/dL    Nitrite NEGATIVE NEGATIVE    Leukocytes, UA LARGE (A) NEGATIVE    RBC / HPF 0-5 0 - 5 RBC/hpf    WBC, UA TOO NUMEROUS TO COUNT 0 - 5 WBC/hpf    Bacteria, UA RARE (A) NONE SEEN    Squamous Epithelial / LPF NONE SEEN NONE  SEEN    Mucus PRESENT           Imaging Results (Last 48 hours)  Dg Chest Portable 1 View   Result Date: 07/28/2017 CLINICAL DATA:  Cough, chills, tachycardia. EXAM: PORTABLE CHEST 1 VIEW COMPARISON:  CT 01/19/2017.  Chest x-ray 01/17/2017. FINDINGS: Mediastinum hilar structures normal. Heart size normal. No focal infiltrate. No pleural effusion or pneumothorax. Mild thoracic spine scoliosis. No acute bony abnormality. IMPRESSION: No acute cardiopulmonary disease. Electronically Signed   By: Marcello Moores  Register   On: 07/28/2017 09:47       Review of Systems  All other systems reviewed and are negative.   Blood pressure 137/78, pulse 94, temperature 98.5 F (36.9 C), temperature source Oral, resp. rate 15, SpO2 99 %. Physical Exam  Constitutional: She is oriented to person, place, and time.  Chronically ill appearing, malnourished and unkempt appearance.  Complaining of her rectum hurting  HENT:  Head: Normocephalic.  GI: Soft. She exhibits no distension and no mass. There is no tenderness. There is no rebound and no guarding.  Skin around the ostomy is red and  erythematous.  The peroneal wound is open and deep, it is clean but had a foul odor with purulent fluid at the base when I saw her.   Neurological: She is alert and oriented to person, place, and time.  Skin: Skin is warm and dry. No erythema except around the colostomy site.           Assessment/Plan: Dehiscence of perineum, with no local wound care since 07/21/17 Colostomy intact, but skin break down around the site, no colostomy bags at home.   Rectal cancer with chemotherapy and radiation therapy S/p ABDOMINAL PERINEAL RESECTION , 1/61/09, Dr.Alicia Thomas/discharged 06/22/17 Readmitted on 07/10/17, with dehiscence of the perineum Polysubstance abuse Discharged 07/21/17 to SNF, signed out AMA the following day/asking for home health care Dehydration and malnutrition Noncompliance UTI Hyponatremia Hypokalemia   Plan:  Discussed with Dr. Marcello Moores.  She will not assume care for her if she is noncompliant.  It was Dr. Manon Hilding opinion she should return to the SNF, or be admitted to Medicine.  We will follow with Medicine.  I have recommended we do the wet to dry dressings to the perineum.  We are going to start with Dakin's solution.  Wound care is going to put an Eakin's pouch around the ostomy and put to suction and hopefully get the skin around the ostomy in better shape before reapplying an ostomy pouch.      Sherry Baker 07/28/2017, 10:39 AM

## 2017-07-28 NOTE — ED Notes (Signed)
Attempted report 

## 2017-07-28 NOTE — H&P (Signed)
Date: 07/28/2017               Patient Name:  Sherry Baker MRN: [id number]  DOB: [birth year] Age / Sex: 51 y.o., female   PCP: Arnoldo Morale, MD         Medical Service: Internal Medicine Teaching Service         Attending Physician: Dr. Sherry Ruffing, Gwenyth Allegra, *    First Contact: Dr. [name]  Pager: [phone]  Second Contact: Dr. Tiburcio Pea  Pager: [phone]       After Hours (After 5p/  First Contact Pager: [phone]  weekends / holidays): Second Contact Pager: [phone]   Chief Complaint: Rectal pain   History of Present Illness:  Sherry Baker is a 51 y.o. female with history of distal colorectal adenocarcinonma s/p chemoradiation (completed 04/2017) and AP resection in 06/18/2017, noncompliance with wound care recommendations, and substance abuse who presents with lower back pain. Patient has surgery for CRC on 8/16 and was discharged on 8/20. She was readmitted on 9/7 for a malnutrition, failure to thrive, and wound dehiscence. At that time, she was noticed to have necrosis of posterior vaginal wall. She was ultimately discharged to SNF for further wound care, but left AMA after 24hrs. Patient states SNF is not for her and is not going back. She has been staying home with a roommate since leaving SNF.She has been experincing "pain in her bottom" for the past week. She describes it as a throbbing pain that acutely worsened this afternoon. Reports having no supplies for colostomy and wound care. Has been self-medicating with alcohol for pain control. She endorses subjective fevers and chills. Denies chest pain, palpitations, shortness of breath, abdominal pain, N/V, changes in ostomy output, and urinary symptoms.   ED course: Patient was hemodynamically stable on arrival to the ED. She was found to be anemic 9.5,  hyponatremic 129, and hypokalemic 2.9. No leukocytosis noted. UA showed leukocytes, TNTC WBCs and rare bacteria. Lactic acid was 1.48. CXR negative. She received 1L NS bolus and,  K replacement, and IV Dilaudid 1 mg. She was also started on CTX due to concern for UTI.    Review of Systems: A complete ROS was negative except as per HPI.   Meds: Patient has not been taking any medications at home for the past ~4 months.    Allergies: Allergies as of 07/28/2017 - Review Complete 07/28/2017  Allergen Reaction Noted  . Penicillins Hives and Swelling 11/05/2011   Past Medical History:  Diagnosis Date  . Alcohol abuse   . Allergy    PCNS swelling  . Arthritis   . Bipolar 1 disorder ([place])   . Cancer ([place]) 01/21/2017   rectal cancer  . Cirrhosis of liver ([place])   . Depression   . Genetic testing 03/24/2017   Ms. Rockholt underwent genetic counseling and testing for hereditary cancer syndromes on 02/17/2017. Her results were negative for mutations in all 46 genes analyzed by [facility]'s 46-gene Common Hereditary Cancers Panel. Genes analyzed include: APC, ATM, AXIN2, BARD1, BMPR1A, BRCA1, BRCA2, BRIP1, CDH1, CDKN2A, CHEK2, CTNNA1, DICER1, EPCAM, GREM1, HOXB13, KIT, MEN1, MLH1, MSH2, MSH3, MSH6, MUTYH, NBN,  . Hypertension     Family History:  Mother - stomach cancer Maternal aunt - throat cancer  Paternal aunt - "stomach issues", possibly cancer as well    Social History:  Patient lives home with roommate. Unemployed. Admits to drinking 1 beer daily, some times more to cope with the pain. She has a history of  cocaine use, but reports no drug use for the past 3 weeks. She also reports tobacco use, 7-8 cigarettes per day. Would not like nicotine patch, has caused nausea in the past.    Physical Exam: Blood pressure (!) 145/84, pulse 85, temperature 98.5 F (36.9 C), temperature source Oral, resp. rate 14, SpO2 98 %.  General: appears older than stated age and chronically ill, sleeping in bed in no acute distress when see Cardiac: regular rate and rhythm, nl S1/S2, no murmurs, rubs or gallops  Pulm: CTAB, no wheezes or crackles, no increased work of breathing  Abd:  soft, NTND, bowel sounds present, ostomy located on L abdomen and looked pink without signs of infection, unable to assess skin around ostomy due to placement of new pouch, small amount of light brown output noted. See media tab for pictures.  Neuro: A&Ox3, able to move all four extremities, no focal deficits noted  Ext: warm and well perfused, no peripheral edema, 2+ DP pulses bilaterally  Derm: there is a deep, opened wound at the superior part of gluteal folds that is associated with erythema and foul-smelling, thick, yellow drainage. Skin around wounds is erythematous and violaceous. See media tab for pictures.    EKG: not obtained   CXR: personally reviewed my interpretation is patent airway, no enlargement of the cardiac silhouette and, no consultations or opacities noted, no pleural effusions  Assessment & Plan by Problem:  Sherry Baker is a 51 y.o. female with history of distal colorectal adenocarcinonma s/p chemoradiation (completed 04/2017) and AP resection in 06/18/2017 and noncompliance with wound care recommendations who presents with lower back pain.  # Hx of CRC s/p chemoradiation (last tx 04/2017) and APR 06/2017 # Perineal wound dehiscence: Patient presents with wound dehiscence after recent admission in 9/7 for similar complaint. Noncompliance with wound care at home. On exam, there is purulent drainage of wound associated with foul smelling odor and surrounding skin erythema. Surgery involved and following patient.  - Surgery and WOC following, appreciate recommendations  - Per surgery, wet to dry dressings to the perineum + Dakin's solution.  Eakin's pouch around the ostomy and put to suction pending improvement in skin surrounding ostomy. Plan to place ostomy pouch after improvement in surrounding skin.  - Will cover empirically with vancomycin and zosyn per pharmacy  - IV Dilaudid 0.5 mg x1 + oxy 5 mg q6h PRN for pain   - F/u wound and blood cultures   # Hyponatremia and  Hypokalemia: Na 129 and K 2.9 on admission. Asymptomatic. Per review of chart baseline of 127-131 since 06/2017.  - NS @ 75 cc/hr  - Will continue to monitor and replete as needed   # Anemia: Hgb 9.5 on admission. Normal at baseline but 8-9 since [facility] on 8/16 - CBC in AM   # Asymptomatic bacteriuria: UA with leukocytes, TNTC WBC and rare bacteria. Denies urinary symptoms.  - s/p CTX x1 in the ED - D/c CTX  - F/u Urine cx   # HTN: Mildly hypertensive at BP 140-150s. Patient not on any antihypertensive at homes  - Will monitor and start antihypertensive as needed   # Alcohol use disorder:  - On CIWA   # Depression:  - Resume home Prozac 20 mg    F: NS @ 75 cc/hr E: will continue to monitor and replete as needed N: HH + CM diet   VTE ppx: SQ heparin   Code status: Full code, confirmed on admission   Dispo: Admit patient  to Observation with expected length of stay less than 2 midnights.  Signed: Welford Roche, MD  Internal Medicine PGY-1  P [phone]

## 2017-07-28 NOTE — ED Triage Notes (Signed)
Pt states she ran out of ostomy bags at home and believes site could be infected; pt c/o redness and burning to surgical  Site; pt a&ox 4; no temp at triage; Pt has no bag covering colostomy just bandages.

## 2017-07-28 NOTE — ED Notes (Signed)
Wound Ostomy RN at bedside assessing pt, A11 sent for supplies for ostomy d/t pt skin peri ostomy, ostomy bag unable to be placed, ostomy nurse to address the ostomy issue, Wound RN spoke with MD re: need for surgery consult for buttocks wound, will continue to monitor

## 2017-07-29 DIAGNOSIS — T8131XA Disruption of external operation (surgical) wound, not elsewhere classified, initial encounter: Secondary | ICD-10-CM | POA: Diagnosis present

## 2017-07-29 DIAGNOSIS — Z923 Personal history of irradiation: Secondary | ICD-10-CM

## 2017-07-29 DIAGNOSIS — D638 Anemia in other chronic diseases classified elsewhere: Secondary | ICD-10-CM | POA: Diagnosis present

## 2017-07-29 DIAGNOSIS — R000 Tachycardia, unspecified: Secondary | ICD-10-CM | POA: Diagnosis present

## 2017-07-29 DIAGNOSIS — D649 Anemia, unspecified: Secondary | ICD-10-CM

## 2017-07-29 DIAGNOSIS — T8131XD Disruption of external operation (surgical) wound, not elsewhere classified, subsequent encounter: Secondary | ICD-10-CM

## 2017-07-29 DIAGNOSIS — Z880 Allergy status to penicillin: Secondary | ICD-10-CM | POA: Diagnosis not present

## 2017-07-29 DIAGNOSIS — I10 Essential (primary) hypertension: Secondary | ICD-10-CM | POA: Diagnosis present

## 2017-07-29 DIAGNOSIS — F329 Major depressive disorder, single episode, unspecified: Secondary | ICD-10-CM | POA: Diagnosis present

## 2017-07-29 DIAGNOSIS — E871 Hypo-osmolality and hyponatremia: Secondary | ICD-10-CM

## 2017-07-29 DIAGNOSIS — Z597 Insufficient social insurance and welfare support: Secondary | ICD-10-CM

## 2017-07-29 DIAGNOSIS — E876 Hypokalemia: Secondary | ICD-10-CM | POA: Diagnosis present

## 2017-07-29 DIAGNOSIS — D509 Iron deficiency anemia, unspecified: Secondary | ICD-10-CM | POA: Diagnosis present

## 2017-07-29 DIAGNOSIS — Z681 Body mass index (BMI) 19 or less, adult: Secondary | ICD-10-CM | POA: Diagnosis not present

## 2017-07-29 DIAGNOSIS — E43 Unspecified severe protein-calorie malnutrition: Secondary | ICD-10-CM | POA: Diagnosis present

## 2017-07-29 DIAGNOSIS — Z9119 Patient's noncompliance with other medical treatment and regimen: Secondary | ICD-10-CM

## 2017-07-29 DIAGNOSIS — C20 Malignant neoplasm of rectum: Secondary | ICD-10-CM

## 2017-07-29 DIAGNOSIS — E860 Dehydration: Secondary | ICD-10-CM | POA: Diagnosis present

## 2017-07-29 DIAGNOSIS — Z800 Family history of malignant neoplasm of digestive organs: Secondary | ICD-10-CM | POA: Diagnosis not present

## 2017-07-29 DIAGNOSIS — Y732 Prosthetic and other implants, materials and accessory gastroenterology and urology devices associated with adverse incidents: Secondary | ICD-10-CM

## 2017-07-29 DIAGNOSIS — Z9221 Personal history of antineoplastic chemotherapy: Secondary | ICD-10-CM | POA: Diagnosis not present

## 2017-07-29 DIAGNOSIS — F17210 Nicotine dependence, cigarettes, uncomplicated: Secondary | ICD-10-CM | POA: Diagnosis present

## 2017-07-29 DIAGNOSIS — L03317 Cellulitis of buttock: Secondary | ICD-10-CM | POA: Diagnosis present

## 2017-07-29 DIAGNOSIS — F1410 Cocaine abuse, uncomplicated: Secondary | ICD-10-CM | POA: Diagnosis present

## 2017-07-29 DIAGNOSIS — Z808 Family history of malignant neoplasm of other organs or systems: Secondary | ICD-10-CM | POA: Diagnosis not present

## 2017-07-29 DIAGNOSIS — R8271 Bacteriuria: Secondary | ICD-10-CM

## 2017-07-29 DIAGNOSIS — Z933 Colostomy status: Secondary | ICD-10-CM | POA: Diagnosis not present

## 2017-07-29 DIAGNOSIS — K6289 Other specified diseases of anus and rectum: Secondary | ICD-10-CM | POA: Diagnosis present

## 2017-07-29 DIAGNOSIS — F1010 Alcohol abuse, uncomplicated: Secondary | ICD-10-CM | POA: Diagnosis present

## 2017-07-29 DIAGNOSIS — Z9049 Acquired absence of other specified parts of digestive tract: Secondary | ICD-10-CM

## 2017-07-29 DIAGNOSIS — Z85048 Personal history of other malignant neoplasm of rectum, rectosigmoid junction, and anus: Secondary | ICD-10-CM | POA: Diagnosis not present

## 2017-07-29 LAB — GLUCOSE, CAPILLARY: Glucose-Capillary: 93 mg/dL (ref 65–99)

## 2017-07-29 LAB — MRSA PCR SCREENING: MRSA by PCR: NEGATIVE

## 2017-07-29 LAB — URINE CULTURE

## 2017-07-29 LAB — COMPREHENSIVE METABOLIC PANEL
ALK PHOS: 53 U/L (ref 38–126)
ALT: 12 U/L — AB (ref 14–54)
AST: 14 U/L — AB (ref 15–41)
Albumin: 1.9 g/dL — ABNORMAL LOW (ref 3.5–5.0)
Anion gap: 6 (ref 5–15)
BILIRUBIN TOTAL: 0.5 mg/dL (ref 0.3–1.2)
CALCIUM: 7.8 mg/dL — AB (ref 8.9–10.3)
CHLORIDE: 101 mmol/L (ref 101–111)
CO2: 23 mmol/L (ref 22–32)
Creatinine, Ser: 0.37 mg/dL — ABNORMAL LOW (ref 0.44–1.00)
GFR calc non Af Amer: >60 mL/min (ref >60)
Glucose, Bld: 112 mg/dL — ABNORMAL HIGH (ref 65–99)
Potassium: 4 mmol/L (ref 3.5–5.1)
Sodium: 130 mmol/L — ABNORMAL LOW (ref 135–145)
TOTAL PROTEIN: 5.6 g/dL — AB (ref 6.5–8.1)

## 2017-07-29 LAB — CBC
HEMATOCRIT: 26.3 % — AB (ref 36.0–46.0)
Hemoglobin: 8.5 g/dL — ABNORMAL LOW (ref 12.0–15.0)
MCH: 26.7 pg (ref 26.0–34.0)
MCHC: 32.3 g/dL (ref 30.0–36.0)
MCV: 82.7 fL (ref 78.0–100.0)
PLATELETS: 186 10*3/uL (ref 150–400)
RBC: 3.18 MIL/uL — AB (ref 3.87–5.11)
RDW: 17.6 % — ABNORMAL HIGH (ref 11.5–15.5)
WBC: 4.1 10*3/uL (ref 4.0–10.5)

## 2017-07-29 MED ORDER — ADULT MULTIVITAMIN W/MINERALS CH
1.0000 | ORAL_TABLET | Freq: Every day | ORAL | Status: DC
  Administered 2017-07-29 – 2017-08-01 (×4): 1 via ORAL
  Filled 2017-07-29 (×4): qty 1

## 2017-07-29 MED ORDER — ENSURE ENLIVE PO LIQD
237.0000 mL | Freq: Three times a day (TID) | ORAL | Status: DC
  Administered 2017-07-29 – 2017-08-01 (×10): 237 mL via ORAL

## 2017-07-29 MED ORDER — HYDROMORPHONE HCL 1 MG/ML IJ SOLN
1.0000 mg | Freq: Four times a day (QID) | INTRAMUSCULAR | Status: DC | PRN
  Administered 2017-07-29 – 2017-08-01 (×11): 1 mg via INTRAVENOUS
  Filled 2017-07-29 (×12): qty 1

## 2017-07-29 MED ORDER — HYDROMORPHONE HCL 1 MG/ML IJ SOLN
0.5000 mg | Freq: Four times a day (QID) | INTRAMUSCULAR | Status: DC | PRN
  Administered 2017-07-29 (×2): 0.5 mg via INTRAVENOUS
  Filled 2017-07-29 (×2): qty 1

## 2017-07-29 NOTE — Progress Notes (Signed)
Subjective:  No acute events overnight. Patient continues to complain of pain in her buttocks. States she is agreeable to go to SNF if placed in [place]. However, patient is uninsured and will required significant wound care that will limit SNF options for her.   Objective:  Vital signs in last 24 hours: Vitals:   07/28/17 1800 07/28/17 1815 07/28/17 1911 07/29/17 0550  BP: (!) 141/91 122/75 (!) 157/80 (!) 142/72  Pulse: 93 84 85 83  Resp:   16 18  Temp:   99 F (37.2 C) 98.3 F (36.8 C)  TempSrc:   Oral Oral  SpO2: 96% 95% 98% 98%  Weight:   106 lb 11.2 oz (48.4 kg)   Height:   5\' 2"  (1.575 m)     General: chronic renal ill patient that appears older than stated age, sleeping in bed in no acute distress Cardiac: regular rate and rhythm, nl S1/S2, no murmurs, rubs or gallops  Pulm: CTAB, no wheezes or crackles, no increased work of breathing  Abd: soft, NTND, bowel sounds present, ostomy at LLQ with small amount of stool noted, unable to examine skin surrounding ostomy due to pouch placement  Neuro: A&Ox3, able to move all 4 extremities, no focal deficits noted  Ext: warm and well perfused, no peripheral edema  Derm: perineal wound bandaged and dressed, yellow drainage and foul smelling odor noted    Assessment/Plan:  Sherry Baker is a 51 y.o. female with history of distal colorectal adenocarcinonma s/p chemoradiation (completed 04/2017) and AP resection in 06/18/2017 and noncompliance with wound care recommendations who presents with lower back pain.  # Hx of CRC s/p chemoradiation (last tx 04/2017) and APR 06/2017 # Perineal wound dehiscence: Patient presents with wound dehiscence after recent admission in 9/7 for similar complaint. Noncompliance with wound care at home. Wound was dressed when seen, but continuous purulent drainage noted. Wound cx with rare GPCs and GPR. Surgery involved and following patient. Will contact SW for SNF placement. Patient will be a difficult SNF  placement (see subjective for more details).  - Surgery and WOC following, appreciate recommendations  - Per surgery, wet to dry dressings to the perineum + Dakin's solution. Eakin's pouch around the ostomy and put to suction pending improvement in skin surrounding ostomy. Plan to place ostomy pouch after improvement in surrounding skin.  - Continue vancomycin and zosyn per pharmacy for empiric coverage - IV Dilaudid 0.5 --> 1 mg q6h PRN + oxy 5 mg q6h PRN for pain   - F/u blood cx   # Hyponatremia and Hypokalemia: hypokalemia resolved, K 4 this morning. Hyponatremia seems to be chronic in nature but will order serial for evaluation. - Serum osm   # Anemia: Hgb 9.5 on admission. Normal at baseline but 8-9 since [place] on 8/16 - Will continue to monitor, CBC in AM   # Asymptomatic bacteriuria: UA with leukocytes, TNTC WBC and rare bacteria. Urine culture with multiple species present. Continues to deny urinary symptoms.  - F/u serum osm    # HTN: Mildly hypertensive at BP 140-150s. Patient not on any antihypertensive at homes  - Will monitor and start antihypertensive as needed   # Alcohol use disorder:  - On CIWA, no ativan ordered   F: None  E: will continue to monitor and replete as needed N: HH + CM diet   VTE ppx: SQ heparin   Code status: Full code, confirmed on admission   Dispo: Anticipated discharge in approximately 3-4 day(s) pending  improvement of wounds and SNF placement.   Welford Roche, MD  Internal Medicine PGY-1  P [phone]

## 2017-07-29 NOTE — Consult Note (Signed)
[facility] Nurse ostomy follow up Stoma type/location: LLQ, end colostomy Stomal assessment/size: 1 5/8" Peristomal assessment: Eakin pouch intact from my placement in the ED Treatment options for stomal/peristomal skin: Using Eakin and ostomy powder crusting.  Output liquid brown green Ostomy pouching: small Eakin pouch in place, will try to convert her back to normal 1pc flat pouch tomorrow am Education provided:  Enrolled patient in [employer] Discharge program: Yes I provided patient with indigent information for [employer], she will have to enroll in this program independently. [place] [place] nurse is not allowed to do this. It will require her to call and follow up with the needed paperwork provided. She will have to provide them with financial paperwork and information.   I will make sure and send her home with some pouches at the time of discharge.   [facility] nurse will follow along for wound and ostomy support.  [place], [place], [place]

## 2017-07-29 NOTE — Evaluation (Signed)
Occupational Therapy Evaluation Patient Details Name: Sherry Baker MRN: [id number] DOB: [birth year] Today's Date: 07/29/2017    History of Present Illness 51 y.o. female with history of distal colorectal adenocarcinonma s/p chemoradiation (completed 04/2017) and AP resection in 06/18/2017, bipolar 1 disorder, noncompliance with wound care recommendations, and substance abusewho presents with lower back pain.     Clinical Impression   PTA, pt was living with a friend and was performing her BADLs with limitations due to pain. Pt currently performing ADLs and functional mobility at supervision-Min Guard level for safety. Pt would benefit from acute OT to facilitate safe dc and optimize occupational performance and participation. Recommend dc to SNF for wound care and to optimize return to PLOF.     Follow Up Recommendations  SNF;Supervision - Intermittent (Pt declining SNF. [place] aide; [place] RN for wounds)    Equipment Recommendations  None recommended by OT    Recommendations for Other Services       Precautions / Restrictions Precautions Precautions: Fall Precaution Comments: abdominal and perineal wounds Restrictions Weight Bearing Restrictions: No      Mobility Bed Mobility Overal bed mobility: Needs Assistance Bed Mobility: Supine to Sit (side lying to slide feet off EOB and then stand)     Supine to sit: Min assist     General bed mobility comments: Min A to bring hips towards EOB while pt pulled trunk and used bed rails. Pt transitioned from side lying to slide feet off EOB and then stand. Pt not able to sit EOB to due pain on bottom  Transfers Overall transfer level: Needs assistance Equipment used: None Transfers: Sit to/from Stand Sit to Stand: Supervision         General transfer comment: for safety    Balance Overall balance assessment: Needs assistance;History of Falls     Sitting balance - Comments: Unable to sit due to pain     Standing balance-Leahy  Scale: Fair Standing balance comment: Benefited from single UE support                           ADL either performed or assessed with clinical judgement   ADL Overall ADL's : Needs assistance/impaired Eating/Feeding: Supervision/ safety;Set up;Bed level Eating/Feeding Details (indicate cue type and reason): Pt unable to sit due to pain on bottom Grooming: Set up;Standing;Supervision/safety;Wash/dry hands   Upper Body Bathing: Standing;Supervision/ safety   Lower Body Bathing: Supervison/ safety;Sit to/from stand   Upper Body Dressing : Standing;Set up;Supervision/safety   Lower Body Dressing: Sit to/from stand;Min guard Lower Body Dressing Details (indicate cue type and reason): Pt donned socks by standing and bending forward to slowly place socks on. Pt unable to sit for LB ADLs due to pain. Provided Min Guard A for safety while pt donned socks Toilet Transfer: Nature conservation officer;Ambulation Toilet Transfer Details (indicate cue type and reason): Min Guard A for safety while pt squated over toilet to urinate. Pt unable to sit on toilet due to pain Toileting- Clothing Manipulation and Hygiene: Sit to/from stand;Supervision/safety       Functional mobility during ADLs: Min guard (IV pole for support) General ADL Comments: Pt performing ADLs and functional mobiltiy at [employer] level for safety. Pt requiring single UE support durign functional mobility and demonstrated decreased balance.      Vision         Perception     Praxis      Pertinent Vitals/Pain Pain Assessment: 0-10 Pain Score: 10-Worst  pain ever Faces Pain Scale: Hurts whole lot Pain Location: abdominal, perineal areas Pain Descriptors / Indicators: Sore Pain Intervention(s): Monitored during session;Limited activity within patient's tolerance;Repositioned     Hand Dominance Right   Extremity/Trunk Assessment Upper Extremity Assessment Upper Extremity Assessment: Overall WFL for  tasks assessed   Lower Extremity Assessment Lower Extremity Assessment: Generalized weakness   Cervical / Trunk Assessment Cervical / Trunk Assessment: Normal   Communication Communication Communication: No difficulties   Cognition Arousal/Alertness: Awake/alert Behavior During Therapy: WFL for tasks assessed/performed Overall Cognitive Status: Within Functional Limits for tasks assessed                                     General Comments       Exercises     Shoulder Instructions      Home Living Family/patient expects to be discharged to:: Private residence Living Arrangements: Non-relatives/Friends Available Help at Discharge: Family;Available PRN/intermittently Type of Home: House       Home Layout: One level     Bathroom Shower/Tub: Corporate investment banker: Standard     Home Equipment: Bedside commode;Cane - single point          Prior Functioning/Environment Level of Independence: Independent        Comments: Pt reports that she has been performing her ADLs        OT Problem List: Decreased strength;Decreased activity tolerance;Pain;Impaired balance (sitting and/or standing);Decreased knowledge of use of DME or AE      OT Treatment/Interventions: Self-care/ADL training;DME and/or AE instruction;Balance training;Patient/family education;Therapeutic activities    OT Goals(Current goals can be found in the care plan section) Acute Rehab OT Goals Patient Stated Goal: less pain. wounds to heal OT Goal Formulation: With patient Time For Goal Achievement: 07/18/17 Potential to Achieve Goals: Good ADL Goals Pt Will Perform Grooming: with modified independence;standing Pt Will Perform Upper Body Dressing: with modified independence;standing Pt Will Perform Lower Body Dressing: with modified independence;sit to/from stand Pt Will Transfer to Toilet: with modified independence;regular height toilet;ambulating Pt Will  Perform Toileting - Clothing Manipulation and hygiene: with modified independence;sit to/from stand Pt Will Perform Tub/Shower Transfer: Tub transfer;3 in 1;ambulating;with modified independence  OT Frequency: Min 2X/week   Barriers to D/C:            Co-evaluation              AM-PAC PT "6 Clicks" Daily Activity     Outcome Measure Help from another person eating meals?: None Help from another person taking care of personal grooming?: A Little Help from another person toileting, which includes using toliet, bedpan, or urinal?: A Little Help from another person bathing (including washing, rinsing, drying)?: A Little Help from another person to put on and taking off regular upper body clothing?: A Little Help from another person to put on and taking off regular lower body clothing?: A Little 6 Click Score: 19   End of Session Nurse Communication: Mobility status;Other (comment) (Pt urinated)  Activity Tolerance: Patient limited by pain Patient left: in bed;with call bell/phone within reach  OT Visit Diagnosis: Unsteadiness on feet (R26.81)                Time: 1355-1410 OT Time Calculation (min): 15 min Charges:  OT General Charges $OT Visit: 1 Visit OT Evaluation $OT Eval Low Complexity: 1 Low G-Codes: OT G-codes **NOT FOR INPATIENT CLASS** Functional Assessment  Tool Used: Clinical judgement Functional Limitation: Self care Self Care Current Status [phone]): At least 20 percent but less than 40 percent impaired, limited or restricted Self Care Goal Status (N1836): 0 percent impaired, limited or restricted   [place], OTR/L Acute Rehab Pager: [phone] Office: [place] 07/29/2017, 2:31 PM

## 2017-07-29 NOTE — Progress Notes (Signed)
Initial Nutrition Assessment  DOCUMENTATION CODES:   Severe malnutrition in context of chronic illness  INTERVENTION:   -Ensure Enlive po TID, each supplement provides 350 kcal and 20 grams of protein -MVI daily  NUTRITION DIAGNOSIS:   Malnutrition (Severe) related to chronic illness (colorectal cancer) as evidenced by moderate depletions of muscle mass, severe depletion of muscle mass, percent weight loss.  GOAL:   Patient will meet greater than or equal to 90% of their needs  MONITOR:   PO intake, Supplement acceptance, Labs, Weight trends, Skin, I & O's  REASON FOR ASSESSMENT:   Consult, Malnutrition Screening Tool Assessment of nutrition requirement/status  ASSESSMENT:   Sherry Baker is a 51 y.o. female with history of distal colorectal adenocarcinonma s/p chemoradiation (completed 04/2017) and AP resection in 06/18/2017, noncompliance with wound care recommendations, and substance abuse who presents with lower back pain. Patient has surgery for CRC on 8/16 and was discharged on 8/20. She was readmitted on 9/7 for a malnutrition, failure to thrive, and wound dehiscence. At that time, she was noticed to have necrosis of posterior vaginal wall. She was ultimately discharged to SNF for further wound care, but left AMA after 24hrs.  Pt admitted with perineal wound dehiscence.   Pt very lethargic at time of visit. Did not participate in interview or arouse during exam.   No intake has been documented.   Wt hx reviewed; noted pt has experienced a 12.4% wt loss over the past 6 months, which is significant for time frame.   Nutrition-Focused physical exam completed. Findings are mild to moderate fat depletion, moderate to severe muscle depletion, and no edema.   Pt with likely poor oral intake and would benefit from nutrient dense supplement. One Ensure Enlive supplement provides 350 kcals, 20 grams protein, and 44-45 grams of carbohydrate vs one Glucerna shake supplement, which  provides 220 kcals, 10 grams of protein, and 26 grams of carbohydrate. Given pt's hx of DM, RD will continue to monitor PO intake, CBGS, and adjust supplement regimen as appropriate.   Labs reviewed: Na: 130.   Diet Order:  Diet heart healthy/carb modified Room service appropriate? Yes; Fluid consistency: Thin  Skin:  Wound (see comment) (closed abdominal incision, open perineum wound)  Last BM:  07/29/17  Height:   Ht Readings from Last 1 Encounters:  07/28/17 5\' 2"  (1.575 m)    Weight:   Wt Readings from Last 1 Encounters:  07/28/17 106 lb 11.2 oz (48.4 kg)    Ideal Body Weight:  50 kg  BMI:  Body mass index is 19.52 kg/m.  Estimated Nutritional Needs:   Kcal:  1500-1700  Protein:  80-95 grams  Fluid:  > 1.5 L  EDUCATION NEEDS:   Education needs no appropriate at this time  Sherry Baker A. Jimmye Norman, RD, LDN, CDE Pager: [phone] After hours Pager: [phone]

## 2017-07-29 NOTE — Progress Notes (Signed)
Pt well known to me.  Pictures in epic reviewed.  She has been non-compliant since discharge from the hospital and left her ECF AMA.  She will need long term placement if she has any hope of healing her wound.  She cannot go home with home health.  With good nutrition and appropriate parastomal care, she should be able to heal these wounds completely.  Consider psych eval.  Please contact me if you have any questions.    Rosario Adie, MD  [facility]  [phone]

## 2017-07-29 NOTE — Evaluation (Signed)
Physical Therapy Evaluation Patient Details Name: Sherry Baker MRN: [id number] DOB: 10/22/1966 Today's Date: 07/29/2017   History of Present Illness  51 y.o. female with history of distal colorectal adenocarcinonma s/p chemoradiation (completed 04/2017) and AP resection in 06/18/2017, bipolar 1 disorder, noncompliance with wound care recommendations, and substance abusewho presents with lower back pain.    Clinical Impression  Pt admitted with/for wound dehiscence.  Pt now deconditioned needing supervision for mobility.  Pt currently limited functionally due to the problems listed. ( See problems list.)   Pt will benefit from PT to maximize function and safety in order to get ready for next venue listed below.     Follow Up Recommendations SNF (Needs SNF for wound care more than rehab)    Equipment Recommendations  Rolling walker with 5" wheels    Recommendations for Other Services       Precautions / Restrictions Precautions Precautions: Fall Precaution Comments: abdominal and perineal wounds Restrictions Weight Bearing Restrictions: No      Mobility  Bed Mobility Overal bed mobility: Needs Assistance Bed Mobility: Sidelying to Sit;Rolling;Sit to Supine (side lying to slide feet off EOB and then stand) Rolling: Supervision Sidelying to sit: Supervision Supine to sit: Min assist Sit to supine: Supervision   General bed mobility comments: pt used rail moderately, but otherwise did not need any assist  Transfers Overall transfer level: Needs assistance Equipment used: None Transfers: Sit to/from Stand Sit to Stand: Supervision         General transfer comment: for safety  Ambulation/Gait   Ambulation Distance (Feet): 160 Feet Assistive device: Rolling walker (2 wheeled) Gait Pattern/deviations: Step-through pattern Gait velocity: slower Gait velocity interpretation: Below normal speed for age/gender General Gait Details: small antalgic steps, but generally steady  with RW  Stairs            Wheelchair Mobility    Modified Rankin (Stroke Patients Only)       Balance Overall balance assessment: Needs assistance;History of Falls     Sitting balance - Comments: Unable to sit due to pain     Standing balance-Leahy Scale: Fair Standing balance comment: Benefited from single UE support                             Pertinent Vitals/Pain Pain Assessment: Faces Pain Score: 10-Worst pain ever Faces Pain Scale: Hurts whole lot Pain Location: abdominal, perineal areas Pain Descriptors / Indicators: Sore Pain Intervention(s): Monitored during session    Home Living Family/patient expects to be discharged to:: Private residence Living Arrangements: Non-relatives/Friends Available Help at Discharge: Family;Available PRN/intermittently Type of Home: House Home Access: Stairs to enter Entrance Stairs-Rails: Psychiatric nurse of Steps: several Home Layout: One level Home Equipment: Bedside commode;Cane - single point      Prior Function Level of Independence: Independent         Comments: Pt reports that she has been performing her ADLs     Hand Dominance   Dominant Hand: Right    Extremity/Trunk Assessment   Upper Extremity Assessment Upper Extremity Assessment: Defer to OT evaluation    Lower Extremity Assessment Lower Extremity Assessment: Generalized weakness (but functional)    Cervical / Trunk Assessment Cervical / Trunk Assessment: Normal  Communication   Communication: No difficulties  Cognition Arousal/Alertness: Awake/alert Behavior During Therapy: WFL for tasks assessed/performed Overall Cognitive Status: Within Functional Limits for tasks assessed  General Comments      Exercises     Assessment/Plan    PT Assessment Patient needs continued PT services  PT Problem List Decreased activity tolerance;Decreased  strength;Decreased balance;Decreased mobility;Decreased knowledge of use of DME;Pain       PT Treatment Interventions Gait training;Functional mobility training;Therapeutic activities;Balance training;Patient/family education    PT Goals (Current goals can be found in the Care Plan section)  Acute Rehab PT Goals Patient Stated Goal: less pain. wounds to heal PT Goal Formulation: With patient Time For Goal Achievement: 08/12/17 Potential to Achieve Goals: Good    Frequency Min 2X/week   Barriers to discharge        Co-evaluation               AM-PAC PT "6 Clicks" Daily Activity  Outcome Measure Difficulty turning over in bed (including adjusting bedclothes, sheets and blankets)?: A Little Difficulty moving from lying on back to sitting on the side of the bed? : A Little Difficulty sitting down on and standing up from a chair with arms (e.g., wheelchair, bedside commode, etc,.)?: A Little Help needed moving to and from a bed to chair (including a wheelchair)?: A Little Help needed walking in hospital room?: A Little Help needed climbing 3-5 steps with a railing? : A Little 6 Click Score: 18    End of Session   Activity Tolerance: Patient tolerated treatment well;Patient limited by pain Patient left: in bed;with call bell/phone within reach Nurse Communication: Mobility status PT Visit Diagnosis: Unsteadiness on feet (R26.81);Other abnormalities of gait and mobility (R26.89) Pain - part of body:  (buttock/inguinal)    Time: 6945-0388 PT Time Calculation (min) (ACUTE ONLY): 28 min   Charges:   PT Evaluation $PT Eval Moderate Complexity: 1 Mod PT Treatments $Gait Training: 8-22 mins   PT G Codes:   PT G-Codes **NOT FOR INPATIENT CLASS** Functional Assessment Tool Used: AM-PAC 6 Clicks Basic Mobility;Clinical judgement Functional Limitation: Mobility: Walking and moving around Mobility: Walking and Moving Around Current Status (E2800): At least 1 percent but less  than 20 percent impaired, limited or restricted Mobility: Walking and Moving Around Goal Status [phone]): At least 1 percent but less than 20 percent impaired, limited or restricted    07/29/2017  Donnella Sham, PT [phone]  (pager)  Tessie Fass Leighton Luster 07/29/2017, 2:59 PM

## 2017-07-29 NOTE — Progress Notes (Signed)
Date: 07/29/2017  Patient name: Sherry Baker  Medical record number: [id number]  Date of birth: August 08, 1966   I have seen and evaluated Sherry Baker and discussed their care with the Residency Team. Sherry Baker is a 51 year old woman with rectal cancer who completed chemotherapy and radiation and June 2018 and then had a abdominal perineal resection on August 16. She was discharged to home on August 20. She was readmitted on September 7 after a fall and wound drainage. She had a wound he has in the perineum and was discharged on the 18th to skilled nursing facility. She signed out AMA after 24 hours. Today she states she signed out because at her facility was not in [place] and therefore family could not come and visit her. Review of notes from her prior admission seemed to indicate that she was a difficult placement likely due to no insurance and is a history of bed bugs. She now presents after running out of ostomy bags and creating her own. She also complains of pain in her bottom for a week. She endorses subjective fevers and chills. Surgery did not admit her due to her history of noncompliance and has indicated no further surgical intervention is needed. They strongly recommend long-term placement for wound care, nutrition care, and ostomy. Today, the patient complains of severe pain and is asking for her pain medicine. She states that she will go to a skilled nursing facility as long as it is in [place].  PMHx, Fam Hx, and/or Soc Hx : uninsured, lives with friend.   Vitals:   07/28/17 1911 07/29/17 0550  BP: (!) 157/80 (!) 142/72  Pulse: 85 83  Resp: 16 18  Temp: 99 F (37.2 C) 98.3 F (36.8 C)  SpO2: 98% 98%   Chronically ill appearing.  HRRR no MRG LCTAB decent air flow BD + BS, soft Buttock wound dressed with drainage soaking bandage. Did not undress as had just been bandaged.  Na 130 baseline since Aug K 4 Cr 0.37 Alb 1.9 WBC 4.1 HgB 8.5 lower than June baseline but stable  since Aug MCV 82.7 RDW 17.6  I personally viewed the CXR images and confirmed my reading with the official read. Rotated, over penetrated, AP 1 view, tenting of diaphragm on R, no infiltrate  Assessment and Plan: I have seen and evaluated the patient as outlined above. I agree with the formulated Assessment and Plan as detailed in the residents' note, with the following changes: Sherry Baker is a 51 year old woman with rectal cancer status post chemotherapy and radiation and status post abdominal perineal resection on August 16. She has essentially been readmitted for inability to care for herself at home. She has no insurance, may have lead bugs in her home, and appears not to have a great social support. Surgery is indicated she needs no further surgical intervention. We are maximizing ancillary services while in the hospital including nutrition, wound care, ostomy care, PT, OT, and social work. Patient is in agreement to go to the [facility] but per prior notes, she is a difficult placement and this may not be possible. She has been started on Vanco and cefepime to cover likely infectious etiologies for her wound infections.   1. Wound Dehis - continue Lantus and cefepime. Continue wound care, nutrition therapy, and frequent turning. Pain control will be challenging.   2. hyponatremia - patient has no description per Epic 7 start with osmolality with further workup based on osmolality results. She does endorse alcohol intake which  may point towards the etiology.  3. Anemia - this has not been workup either. The ferritin is not going to be useful in the setting of infection. Her hemoglobin overall is stable since August. Would start with an anemia panel and B12 level.  4. Poor social situation -consult social work and care management. Hopefully, she will be able to get the SNF bed in [place]. Otherwise she is likely going to refuse placement and therefore will not be able to heal her  wounds.   Bartholomew Crews, MD 9/26/20182:29 PM

## 2017-07-30 DIAGNOSIS — F10239 Alcohol dependence with withdrawal, unspecified: Secondary | ICD-10-CM

## 2017-07-30 DIAGNOSIS — E43 Unspecified severe protein-calorie malnutrition: Secondary | ICD-10-CM

## 2017-07-30 LAB — IRON AND TIBC
Iron: 23 ug/dL — ABNORMAL LOW (ref 28–170)
Saturation Ratios: 10 % — ABNORMAL LOW (ref 10.4–31.8)
TIBC: 221 ug/dL — AB (ref 250–450)
UIBC: 198 ug/dL

## 2017-07-30 LAB — CBC
HCT: 26.9 % — ABNORMAL LOW (ref 36.0–46.0)
Hemoglobin: 8.4 g/dL — ABNORMAL LOW (ref 12.0–15.0)
MCH: 26 pg (ref 26.0–34.0)
MCHC: 31.2 g/dL (ref 30.0–36.0)
MCV: 83.3 fL (ref 78.0–100.0)
Platelets: 206 10*3/uL (ref 150–400)
RBC: 3.23 MIL/uL — ABNORMAL LOW (ref 3.87–5.11)
RDW: 18.3 % — AB (ref 11.5–15.5)
WBC: 3.8 10*3/uL — ABNORMAL LOW (ref 4.0–10.5)

## 2017-07-30 LAB — TSH: TSH: 2.425 u[IU]/mL (ref 0.350–4.500)

## 2017-07-30 LAB — BASIC METABOLIC PANEL
Anion gap: 7 (ref 5–15)
CO2: 27 mmol/L (ref 22–32)
CREATININE: 0.35 mg/dL — AB (ref 0.44–1.00)
Calcium: 8.3 mg/dL — ABNORMAL LOW (ref 8.9–10.3)
Chloride: 97 mmol/L — ABNORMAL LOW (ref 101–111)
GFR calc Af Amer: >60 mL/min (ref >60)
GLUCOSE: 102 mg/dL — AB (ref 65–99)
Potassium: 3.4 mmol/L — ABNORMAL LOW (ref 3.5–5.1)
SODIUM: 131 mmol/L — AB (ref 135–145)

## 2017-07-30 LAB — GLUCOSE, CAPILLARY: GLUCOSE-CAPILLARY: 113 mg/dL — AB (ref 65–99)

## 2017-07-30 LAB — OSMOLALITY, URINE: Osmolality, Ur: 369 mOsm/kg (ref 300–900)

## 2017-07-30 LAB — SODIUM, URINE, RANDOM: SODIUM UR: 87 mmol/L

## 2017-07-30 LAB — OSMOLALITY: OSMOLALITY: 273 mosm/kg — AB (ref 275–295)

## 2017-07-30 LAB — VITAMIN B12: Vitamin B-12: 353 pg/mL (ref 180–914)

## 2017-07-30 MED ORDER — FERROUS SULFATE 325 (65 FE) MG PO TABS
325.0000 mg | ORAL_TABLET | Freq: Every day | ORAL | Status: DC
  Administered 2017-07-30 – 2017-08-01 (×3): 325 mg via ORAL
  Filled 2017-07-30 (×3): qty 1

## 2017-07-30 MED ORDER — POTASSIUM CHLORIDE CRYS ER 10 MEQ PO TBCR
30.0000 meq | EXTENDED_RELEASE_TABLET | Freq: Once | ORAL | Status: AC
  Administered 2017-07-30: 30 meq via ORAL
  Filled 2017-07-30: qty 1

## 2017-07-30 NOTE — Consult Note (Signed)
[place] Nurse ostomy follow up Stoma type/location: LLQ, end colostomy Stomal assessment/size: 1 3/8" budded, pink, moist Peristomal assessment: skin much improved, minimal denudation from 4-6 o'clock, treated with pectin ostomy powder  Treatment options for stomal/peristomal skin: ostomy pectin powder with skin prep to crust area Output liquid brown Ostomy pouching: 1pc.flat with clamp Education provided:  Instructed patient on new measurement of her wafer, cut new wafer.  Instructed her on use of clamp closure with indigent program she will most likely receive clamp closure.  Enrolled patient in [place] Start Discharge program: Yes  [place] nurse will follow along with you for support with ostomy and wound care [place], [place], [place]

## 2017-07-30 NOTE — Progress Notes (Signed)
Subjective:  No acute events overnight. Patient continues to complain of pain despite increase in Dilaudid dose yesterday. She is able to stand up and ambulate to bathroom by herself. No other complaints this AM.   Objective:  Vital signs in last 24 hours: Vitals:   07/28/17 1911 07/29/17 0550 07/29/17 2302 07/30/17 0535  BP: (!) 157/80 (!) 142/72 119/84 137/67  Pulse: 85 83 80 85  Resp: 16 18 17 17   Temp: 99 F (37.2 C) 98.3 F (36.8 C) 98.9 F (37.2 C) 98.7 F (37.1 C)  TempSrc: Oral Oral Oral Oral  SpO2: 98% 98% 98% 98%  Weight: 106 lb 11.2 oz (48.4 kg)     Height: 5\' 2"  (1.575 m)       General: appears chronic ill and older than stated age, sleeping in bed in no acute distress Cardiac: regular rate and rhythm, nl S1/S2, no murmurs, rubs or gallops  Pulm: CTAB, no wheezes or crackles, no increased work of breathing  Abd: soft, NTND, bowel sounds present, ostomy at LLQ with small amount of stool noted, ostomy bag now in place  Neuro: A&Ox3, able to move all 4 extremities, no focal deficits noted, no gait abnormalities noted  Ext: warm and well perfused, no peripheral edema  Derm: perineal wound bandaged and dressed, yellow drainage and foul smelling odor noted. Skin around ostomy much improved from admission. No erythema noted today.    Assessment/Plan:  Sherry Baker is a 51 y.o. female with history of distal colorectal adenocarcinonma s/p chemoradiation (completed 04/2017) and AP resection in 06/18/2017 and noncompliance with wound care recommendations who presents with lower back pain.  # Hx of CRC s/p chemoradiation (last tx 04/2017) and APR 06/2017 # Perineal wound dehiscence: Patient presents with wound dehiscence after recent admission in 9/7 for similar complaint due to noncompliance with wound care at home. Wound remains dressed when seen, but continuous purulent drainage noted. Wound cx with rare GPCs and GPR. Surgery involved and following patient. No indication for  MRI at this time as low suspicion for osteomyelitis. Will contact SW for SNF placement. Patient will be a difficult SNF placement as she is uninsured and history of bed bugs.  - Surgery and WOC following, appreciate recommendations  - Per surgery, wet to dry dressings to the perineum + Dakin's solution. WOC to switch Eakin's pouch today.   - Gram stain from wound with GPCs and GPRs. Wound cx pending. Will continue vancomycin cefepime pending wound cx  - Will continue IV Dilaudid 1 mg q6h PRN + oxy 5 mg q6h PRN for pain for pain control as patient looks comfortable when seen   # Hyponatremia:  Normal prior to surgery in 06/2017. Baseline 129-131 since surgery. Serum osmolality 273. Consistent with hypotonic hyponatremia.  Suspect this is secondary to reset osmostat from chronic malnutrition versus tea & toast + beer potomonia. Will further evaluate as described below.  - Urine studies ordered, TSH, and AM cortisol   # Normocytic anemia: Hgb 8.4, downtrending from yestreday. Normal at baseline but 8-9 since SRGY on 8/16. Iron and TIBC low. Ferritin not ordered as it will likely be elevated in the setting of acute illness.  Believe this is a combination of anemia of chronic disease and iron deficiency anemia.  - Start PO ferrous sulfate 325 mg QD  - Will continue to monitor and plan to transfuse if Hgb <7   # Asymptomatic bacteriuria: UA with leukocytes, TNTC WBC and rare bacteria. Urine culture with multiple species  present. Continues to deny urinary symptoms.   # Malnutrition: Nutrition consult placed.  - Ensure supplements  - Daily MVI    # HTN: Mildly hypertensive at BP 140-150s. Patient not on any antihypertensive at home.  - Will continue to monitor and start antihypertensive as needed   # Alcohol use disorder:  - On CIWA, no ativan ordered   F: None  E: will continue to monitor and replete as needed N: HH + CM diet   VTE ppx: SQ heparin   Code status: Full code, confirmed on  admission   Dispo: Anticipated discharge in approximately 3-4 day(s) pending improvement of wounds and SNF placement.   Welford Roche, MD  Internal Medicine PGY-1  P [phone]

## 2017-07-30 NOTE — Progress Notes (Signed)
CSW staffed case with CSW AD. Patient was discharged earlier this month to a [facility] bed at [facility]. Patient left AMA the next day. We are unable to place patient again as there are no closer DTP beds and patient is unable to return to [facility]. Patient will dc home.  CSW signing off.  Percell Locus Hubert Raatz LCSWA [phone]

## 2017-07-31 LAB — CBC
HCT: 24.8 % — ABNORMAL LOW (ref 36.0–46.0)
Hemoglobin: 7.9 g/dL — ABNORMAL LOW (ref 12.0–15.0)
MCH: 26.4 pg (ref 26.0–34.0)
MCHC: 31.9 g/dL (ref 30.0–36.0)
MCV: 82.9 fL (ref 78.0–100.0)
Platelets: 167 10*3/uL (ref 150–400)
RBC: 2.99 MIL/uL — ABNORMAL LOW (ref 3.87–5.11)
RDW: 18.9 % — ABNORMAL HIGH (ref 11.5–15.5)
WBC: 2.9 10*3/uL — ABNORMAL LOW (ref 4.0–10.5)

## 2017-07-31 LAB — CORTISOL-AM, BLOOD: CORTISOL - AM: 7.8 ug/dL (ref 6.7–22.6)

## 2017-07-31 LAB — BASIC METABOLIC PANEL
Anion gap: 6 (ref 5–15)
BUN: <5 mg/dL — ABNORMAL LOW (ref 6–20)
CHLORIDE: 99 mmol/L — AB (ref 101–111)
CO2: 27 mmol/L (ref 22–32)
Calcium: 8.3 mg/dL — ABNORMAL LOW (ref 8.9–10.3)
Creatinine, Ser: 0.3 mg/dL — ABNORMAL LOW (ref 0.44–1.00)
GFR calc non Af Amer: >60 mL/min (ref >60)
Glucose, Bld: 105 mg/dL — ABNORMAL HIGH (ref 65–99)
Potassium: 3.6 mmol/L (ref 3.5–5.1)
Sodium: 132 mmol/L — ABNORMAL LOW (ref 135–145)

## 2017-07-31 LAB — GLUCOSE, CAPILLARY: Glucose-Capillary: 123 mg/dL — ABNORMAL HIGH (ref 65–99)

## 2017-07-31 LAB — VANCOMYCIN, TROUGH: Vancomycin Tr: 5 ug/mL — ABNORMAL LOW (ref 15–20)

## 2017-07-31 MED ORDER — VANCOMYCIN HCL 500 MG IV SOLR
500.0000 mg | Freq: Three times a day (TID) | INTRAVENOUS | Status: DC
  Administered 2017-07-31 – 2017-08-01 (×3): 500 mg via INTRAVENOUS
  Filled 2017-07-31 (×4): qty 500

## 2017-07-31 MED ORDER — POTASSIUM CHLORIDE CRYS ER 20 MEQ PO TBCR
40.0000 meq | EXTENDED_RELEASE_TABLET | Freq: Once | ORAL | Status: AC
  Administered 2017-07-31: 40 meq via ORAL
  Filled 2017-07-31: qty 2

## 2017-07-31 NOTE — Progress Notes (Signed)
CM received consult: Home health needs    Medication needs   Pt without health insurance, CM made referral (charity case) to [facility]. CM to f/u with results. Contingent on pt's discharging Rx needs CM can possible offer Match Letter to assist with medication needs. CM to monitor. Whitman Hero RN,BSN,CM [phone]

## 2017-07-31 NOTE — Progress Notes (Signed)
Subjective:  No acute events overnight. Patient in better spirits this morning. She continues to complain of throbbing pain in her buttock, but looked comfortable in bed. States she tried Conservation officer, nature Discharge program, but unable to reach them. No other complaints this AM.   Objective:  Vital signs in last 24 hours: Vitals:   07/29/17 2302 07/30/17 0535 07/30/17 2146 07/31/17 0418  BP: 119/84 137/67 132/79 126/68  Pulse: 80 85 90 81  Resp: 17 17 15 17   Temp: 98.9 F (37.2 C) 98.7 F (37.1 C) (!) 100.4 F (38 C) 99.3 F (37.4 C)  TempSrc: Oral Oral Oral Oral  SpO2: 98% 98% 98% 97%  Weight:      Height:        General: appears chronic ill and older than stated age, watching TV in bed in no acute distress  Abd: soft, NTND, bowel sounds present, ostomy on LLQ with small amount of stool noted, ostomy bag now in place full of air and with brown stool  Neuro: A&Ox3, able to move all 4 extremities, no focal deficits note  Ext: warm and well perfused, no peripheral edema  Derm: perineal wound bandaged and dressed, yellow drainage and foul smelling odor noted. Skin around ostomy continues to improve when compared to yesterday. Only mild erythema noted.    Assessment/Plan:  Sherry Baker is a 51 y.o. female with history of distal colorectal adenocarcinonma s/p chemoradiation (completed 04/2017) and AP resection in 06/18/2017 and noncompliance with wound care recommendations who presents with lower back pain.  # Hx of CRC s/p chemoradiation (last tx 04/2017) and APR 06/2017 # Perineal wound dehiscence: Patient presents with wound dehiscence after recent admission in 9/7 for similar complaint due to noncompliance with wound care at home. Wound cx with rare GPCs and GPR, culture with multiple organisms and no predominant ones. Unfortunately, patient is not a candidate for SNF or home health services as she is uninsured and she has left AMA from SNF in the past after 24hrs. She  might qualify for [facility] health charity program.  - WOC following - Will contact SW later today regarding home health arrangements  - Gram stain from wound with GPCs and GPRs. Wound cx with multiple organisms and no predominant ones.. Will continue vancomycin cefepime. Plan to informally consult ID for oral antibiotic therapy recommendations.  - Will continue IV Dilaudid 1 mg q6h PRN + oxy 5 mg q6h PRN for pain for pain control as patient looks comfortable when seen   # Hyponatremia:  Improving, Na 132 today. Normal prior to surgery in 06/2017. Baseline 129-131 since surgery. Serum osmolality 273. Consistent with hypotonic hyponatremia.  Urine sodium 87 and urine osm 369. TSH and AM cortisol normal. Suspect this is secondary to reset osmostat from chronic malnutrition. - Will continue to monitor   # Normocytic anemia: Hgb 7.9, downtrending from yesterday, could be dilutional in the setting of IVF. Normal at baseline but 8-9 since SRGY on 8/16. Iron and TIBC low. Ferritin not ordered as it will likely be elevated in the setting of acute illness.  Believe this is a combination of anemia of chronic disease and iron deficiency anemia.  - Continue PO ferrous sulfate 325 mg QD  - Will continue to monitor and plan to transfuse if Hgb <7   # Asymptomatic bacteriuria: UA with leukocytes, TNTC WBC and rare bacteria. Urine culture with multiple species present. Continues to deny urinary symptoms.   # Malnutrition: Nutrition following.  -  Ensure supplements  - Daily MVI    # HTN: Currently normotensive. Patient not on any antihypertensive at home.  - Will continue to monitor and start antihypertensive as needed   # Alcohol use disorder:  - On CIWA, no ativan ordered   F: None  E: will continue to monitor and replete as needed N: HH + CM diet   VTE ppx: SQ heparin   Code status: Full code, confirmed on admission   Dispo: Anticipated discharge in approximately 1-2 day(s) pending  improvement of wounds and home health arrangement.   Welford Roche, MD  Internal Medicine PGY-1  P [phone]

## 2017-07-31 NOTE — Progress Notes (Signed)
Physical Therapy Treatment Patient Details Name: Sherry Baker MRN: [id number] DOB: 20-Jul-1966 Today's Date: 07/31/2017    History of Present Illness 51 y.o. female with history of distal colorectal adenocarcinonma s/p chemoradiation (completed 04/2017) and AP resection in 06/18/2017, bipolar 1 disorder, noncompliance with wound care recommendations, and substance abusewho presents with lower back pain.      PT Comments    Pt progressing steadily.  Emphasis continues to be independent mobility with stress today on ambulation without an AD. Pt still could benefit from SNF for wound management along with  Reconditioning.   Follow Up Recommendations  SNF     Equipment Recommendations  None recommended by PT (vs RW)    Recommendations for Other Services       Precautions / Restrictions Precautions Precautions: Fall Precaution Comments: abdominal and perineal wounds    Mobility  Bed Mobility Overal bed mobility: Needs Assistance Bed Mobility: Sidelying to Sit;Sit to Sidelying Rolling: Modified independent (Device/Increase time) Sidelying to sit: Modified independent (Device/Increase time)     Sit to sidelying: Modified independent (Device/Increase time) General bed mobility comments: used rail and technique that allowed getting in/out without getting on her rear end.  Transfers Overall transfer level: Needs assistance Equipment used: None Transfers: Sit to/from Stand Sit to Stand: Supervision         General transfer comment: for safety  Ambulation/Gait Ambulation/Gait assistance: Supervision Ambulation Distance (Feet): 180 Feet Assistive device: Rolling walker (2 wheeled) Gait Pattern/deviations: Step-through pattern Gait velocity: slower Gait velocity interpretation: Below normal speed for age/gender General Gait Details: wide based antalgic steps due to perineal wounds.  pt generally steady otherwise.   Stairs            Wheelchair Mobility    Modified  Rankin (Stroke Patients Only)       Balance       Sitting balance - Comments: Unable to sit due to pain       Standing balance comment: can maintain standing without UE, but chooses to stabilize on a stationary object.                            Cognition Arousal/Alertness: Awake/alert Behavior During Therapy: WFL for tasks assessed/performed Overall Cognitive Status: Within Functional Limits for tasks assessed                                        Exercises      General Comments        Pertinent Vitals/Pain Pain Assessment: Faces Faces Pain Scale: Hurts whole lot Pain Location: abdominal, perineal areas Pain Descriptors / Indicators: Sore Pain Intervention(s): Limited activity within patient's tolerance;Monitored during session    Home Living                      Prior Function            PT Goals (current goals can now be found in the care plan section) Acute Rehab PT Goals Patient Stated Goal: less pain. wounds to heal PT Goal Formulation: With patient Time For Goal Achievement: 08/12/17 Potential to Achieve Goals: Fair Progress towards PT goals: Progressing toward goals    Frequency    Min 2X/week      PT Plan Current plan remains appropriate    Co-evaluation  AM-PAC PT "6 Clicks" Daily Activity  Outcome Measure  Difficulty turning over in bed (including adjusting bedclothes, sheets and blankets)?: None Difficulty moving from lying on back to sitting on the side of the bed? : None Difficulty sitting down on and standing up from a chair with arms (e.g., wheelchair, bedside commode, etc,.)?: None Help needed moving to and from a bed to chair (including a wheelchair)?: A Little Help needed walking in hospital room?: A Little Help needed climbing 3-5 steps with a railing? : A Little 6 Click Score: 21    End of Session   Activity Tolerance: Patient tolerated treatment well Patient left:  in bed;with call bell/phone within reach Nurse Communication: Mobility status PT Visit Diagnosis: Other abnormalities of gait and mobility (R26.89);Pain Pain - part of body:  (perineal area)     Time: 8648-4720 PT Time Calculation (min) (ACUTE ONLY): 17 min  Charges:  $Gait Training: 8-22 mins                    G Codes:       Aug 25, 2017  Donnella Sham, PT [phone]  (pager)   Tessie Fass Beth Goodlin 08/25/17, 5:35 PM

## 2017-07-31 NOTE — Progress Notes (Signed)
Occupational Therapy Treatment Patient Details Name: Sherry Baker MRN: [id number] DOB: 1966/02/04 Today's Date: 07/31/2017    History of present illness 51 y.o. female with history of distal colorectal adenocarcinonma s/p chemoradiation (completed 04/2017) and AP resection in 06/18/2017, bipolar 1 disorder, noncompliance with wound care recommendations, and substance abusewho presents with lower back pain.     OT comments  Pt. Remains limited by pain but willing to participate in skilled OT.  Able to perform bed mobility and amb. To/from b.room including hand washing at sink.  Min guard A/S during functional tasks.  Will continue to follow acutely.    Follow Up Recommendations  Supervision - Intermittent;SNF    Equipment Recommendations  None recommended by OT    Recommendations for Other Services      Precautions / Restrictions Precautions Precautions: Fall Precaution Comments: abdominal and perineal wounds Restrictions Weight Bearing Restrictions: No       Mobility Bed Mobility Overal bed mobility: Needs Assistance Bed Mobility: Sidelying to Sit;Sit to Sidelying   Sidelying to sit: Supervision     Sit to sidelying: Supervision General bed mobility comments: pt used rail moderately, but otherwise did not need any assist, has a technique she uses to avoid any pressure on buttocks  Transfers Overall transfer level: Needs assistance Equipment used: None Transfers: Sit to/from [facility] Sit to Stand: Supervision         General transfer comment: for safety    Balance                                           ADL either performed or assessed with clinical judgement   ADL Overall ADL's : Needs assistance/impaired     Grooming: Wash/dry hands;Standing;Supervision/safety                   Toilet Transfer: Nature conservation officer;Ambulation Toilet Transfer Details (indicate cue type and reason): pt. unable to sit on the  toilet due to pain.  able to squat over the toilet to urinate Toileting- Clothing Manipulation and Hygiene: Sit to/from stand;Supervision/safety       Functional mobility during ADLs: Research scientist (physical sciences)      Cognition Arousal/Alertness: Awake/alert Behavior During Therapy: WFL for tasks assessed/performed Overall Cognitive Status: Within Functional Limits for tasks assessed                                          Exercises     Shoulder Instructions       General Comments      Pertinent Vitals/ Pain       Pain Assessment:  (pt. did not rate pain but states it is severe) Pain Location: abdominal, perineal areas Pain Descriptors / Indicators: Sore Pain Intervention(s): Limited activity within patient's tolerance;Monitored during session;Premedicated before session;Repositioned  Home Living                                          Prior Functioning/Environment              Frequency  Min 2X/week  Progress Toward Goals  OT Goals(current goals can now be found in the care plan section)  Progress towards OT goals: Progressing toward goals     Plan Discharge plan remains appropriate    Co-evaluation                 AM-PAC PT "6 Clicks" Daily Activity     Outcome Measure   Help from another person eating meals?: None Help from another person taking care of personal grooming?: A Little Help from another person toileting, which includes using toliet, bedpan, or urinal?: A Little Help from another person bathing (including washing, rinsing, drying)?: A Little Help from another person to put on and taking off regular upper body clothing?: A Little Help from another person to put on and taking off regular lower body clothing?: A Little 6 Click Score: 19    End of Session Equipment Utilized During Treatment: Other (comment) (pt. ambulated with use of IV pole for  stability)  OT Visit Diagnosis: Unsteadiness on feet (R26.81)   Activity Tolerance Patient limited by pain   Patient Left in bed;with call bell/phone within reach   Nurse Communication          Time: 3354-5625 OT Time Calculation (min): 10 min  Charges: OT General Charges $OT Visit: 1 Visit OT Treatments $Self Care/Home Management : 8-22 mins   Janice Coffin, COTA/L 07/31/2017, 10:02 AM

## 2017-07-31 NOTE — Progress Notes (Signed)
Pharmacy Antibiotic Note  Sherry Baker is a 51 y.o. female admitted on 07/28/2017 with a wound infection.  Pharmacy has been consulted for vancomycin and cefepime dosing. Renal function stable. Vancomycin trough of 5 today.   Plan: - Vancomycin 500mg  IV q8h  - Cefepime 1g IV q8h - F/u cultures, renal function, and clinical progression   Height: 5\' 2"  (157.5 cm) Weight: 106 lb 11.2 oz (48.4 kg) IBW/kg (Calculated) : 50.1  Temp (24hrs), Avg:99.9 F (37.7 C), Min:99.3 F (37.4 C), Max:100.4 F (38 C)   Recent Labs Lab 07/28/17 0308 07/28/17 0334 07/29/17 0206 07/30/17 0407 07/31/17 0359 07/31/17 0716  WBC 4.6  --  4.1 3.8* 2.9*  --   CREATININE 0.39*  --  0.37* 0.35* 0.30*  --   LATICACIDVEN  --  1.48  --   --   --   --   VANCOTROUGH  --   --   --   --   --  5*    Estimated Creatinine Clearance: 65 mL/min (A) (by C-G formula based on SCr of 0.3 mg/dL (L)).    Allergies  Allergen Reactions  . Penicillins Hives and Swelling    Has patient had a PCN reaction causing immediate rash, facial/tongue/throat swelling, SOB or lightheadedness with hypotension: Yes Has patient had a PCN reaction causing severe rash involving mucus membranes or skin necrosis: No Has patient had a PCN reaction that required hospitalization No Has patient had a PCN reaction occurring within the last 10 years: Yes If all of the above answers are "NO", then may proceed with Cephalosporin use.     Antimicrobials this admission: 9/25 Cefepime >>  9/25 Vancomycin >>   Dose adjustments this admission: 9/28: Changed vanc dose to 500mg  IV Q8H from 500mg  Q12H due to VT 5.   Microbiology results: 9/25 BCx: ngtd 9/25 UCx: recollect  9/25 buttocks/wound dehiscence: pending  Thank you for allowing pharmacy to be a part of this patient's care.  Leanna Battles, PharmD student 07/31/2017 11:46 AM

## 2017-07-31 NOTE — Progress Notes (Signed)
\  Subjective: C/o of perineal pain  Objective: Vital signs in last 24 hours: Temp:  [99.3 F (37.4 C)-100.4 F (38 C)] 99.3 F (37.4 C) (09/28 0418) Pulse Rate:  [81-90] 81 (09/28 0418) Resp:  [15-17] 17 (09/28 0418) BP: (126-132)/(68-79) 126/68 (09/28 0418) SpO2:  [97 %-98 %] 97 % (09/28 0418) Last BM Date: 07/29/17  Intake/Output from previous day: 09/27 0701 - 09/28 0700 In: 340 [P.O.:240; IV Piggyback:100] Out: -  Intake/Output this shift: No intake/output data recorded.  General appearance: alert and cooperative GI: normal findings: soft, non-tender Incision/Wound: packed  Lab Results:  Results for orders placed or performed during the hospital encounter of 07/28/17 (from the past 24 hour(s))  Basic metabolic panel     Status: Abnormal   Collection Time: 07/31/17  3:59 AM  Result Value Ref Range   Sodium 132 (L) 135 - 145 mmol/L   Potassium 3.6 3.5 - 5.1 mmol/L   Chloride 99 (L) 101 - 111 mmol/L   CO2 27 22 - 32 mmol/L   Glucose, Bld 105 (H) 65 - 99 mg/dL   BUN <5 (L) 6 - 20 mg/dL   Creatinine, Ser 0.30 (L) 0.44 - 1.00 mg/dL   Calcium 8.3 (L) 8.9 - 10.3 mg/dL   GFR calc non Af Amer >60 >60 mL/min   GFR calc Af Amer >60 >60 mL/min   Anion gap 6 5 - 15  CBC     Status: Abnormal   Collection Time: 07/31/17  3:59 AM  Result Value Ref Range   WBC 2.9 (L) 4.0 - 10.5 K/uL   RBC 2.99 (L) 3.87 - 5.11 MIL/uL   Hemoglobin 7.9 (L) 12.0 - 15.0 g/dL   HCT 24.8 (L) 36.0 - 46.0 %   MCV 82.9 78.0 - 100.0 fL   MCH 26.4 26.0 - 34.0 pg   MCHC 31.9 30.0 - 36.0 g/dL   RDW 18.9 (H) 11.5 - 15.5 %   Platelets 167 150 - 400 K/uL  Cortisol-am, blood     Status: None   Collection Time: 07/31/17  3:59 AM  Result Value Ref Range   Cortisol - AM 7.8 6.7 - 22.6 ug/dL  Vancomycin, trough     Status: Abnormal   Collection Time: 07/31/17  7:16 AM  Result Value Ref Range   Vancomycin Tr 5 (L) 15 - 20 ug/mL  Glucose, capillary     Status: Abnormal   Collection Time: 07/31/17  8:30  AM  Result Value Ref Range   Glucose-Capillary 123 (H) 65 - 99 mg/dL     Studies/Results Radiology     MEDS, Scheduled . feeding supplement (ENSURE ENLIVE)  237 mL Oral TID BM  . ferrous sulfate  325 mg Oral Q breakfast  . heparin  5,000 Units Subcutaneous Q8H  . multivitamin with minerals  1 tablet Oral Daily  . sodium chloride flush  3 mL Intravenous Q12H  . sodium hypochlorite   Irrigation q12n4p     Assessment:  Non-compliance Cocaine abuse Malnutrition Possible UTI  Plan: Would d/c Dakins and go back to wet to dry as Dakins are just killing the granulation tissue needed to heal the wound Does not need antibiotics for wound.  She does not have a wound infection.  She has colonization of the wound Recommend antifungal powder for peristomal irritation if not already using this I will not manage home health for this patient.  She has failed this twice in the past already I will not prescribe narcotics for this  patient as an outpatient ? If a foley can be placed and then a wound vac put over the wound Surgery team available over the weekend for questions    LOS: 2 days    Rosario Adie, MD [facility] Surgery, [facility]   07/31/2017 2:37 PM

## 2017-07-31 NOTE — Consult Note (Signed)
[place] Nurse ostomy follow up Stoma type/location:  LLQ, end colostomy Stomal assessment/size: 1 5/8" round, budded  Peristomal assessment: pouch intact from change yesterday Output brown, thin, +flatus Ostomy pouching: 1pc flat in place .  Education provided:  Reminded patient to contact [facility] today for her enrollment into their indigent program.  [facility] nurse can not do this for her.  I have placed the contact for the program again on her bedside table.  She has 5 flat one pc. Pouches to go home with and her pattern for the current sizing for her skin barrier.  She also has ostomy powder if her skin has not cleared, but I feel with next pouch change it will have resolved.  Noted that she can not be discharged back to any other facility.  Will go home, will need HHRN definitely for wound care and support with her ostomy care.  Enrolled patient in [facility] Start Discharge program: Yes   [place] Nurse will follow along with you for continued support with ostomy teaching and care [place] MSN, [place], [place], [place], [place]

## 2017-08-01 LAB — BASIC METABOLIC PANEL
Anion gap: 8 (ref 5–15)
BUN: 6 mg/dL (ref 6–20)
CO2: 28 mmol/L (ref 22–32)
CREATININE: 0.33 mg/dL — AB (ref 0.44–1.00)
Calcium: 8.6 mg/dL — ABNORMAL LOW (ref 8.9–10.3)
Chloride: 97 mmol/L — ABNORMAL LOW (ref 101–111)
GFR calc Af Amer: >60 mL/min (ref >60)
GFR calc non Af Amer: >60 mL/min (ref >60)
Glucose, Bld: 107 mg/dL — ABNORMAL HIGH (ref 65–99)
Potassium: 4.2 mmol/L (ref 3.5–5.1)
Sodium: 133 mmol/L — ABNORMAL LOW (ref 135–145)

## 2017-08-01 LAB — CBC
HEMATOCRIT: 25.7 % — AB (ref 36.0–46.0)
HEMOGLOBIN: 8.1 g/dL — AB (ref 12.0–15.0)
MCH: 26.3 pg (ref 26.0–34.0)
MCHC: 31.5 g/dL (ref 30.0–36.0)
MCV: 83.4 fL (ref 78.0–100.0)
Platelets: 179 10*3/uL (ref 150–400)
RBC: 3.08 MIL/uL — ABNORMAL LOW (ref 3.87–5.11)
RDW: 19.3 % — ABNORMAL HIGH (ref 11.5–15.5)
WBC: 3.9 10*3/uL — ABNORMAL LOW (ref 4.0–10.5)

## 2017-08-01 MED ORDER — OXYCODONE HCL 10 MG PO TABS: 5.0000 mg | ORAL_TABLET | Freq: Three times a day (TID) | ORAL | 0 refills | Status: AC | PRN

## 2017-08-01 NOTE — Progress Notes (Signed)
Subjective:  No acute events overnight. Patient continues to complain of throbbing pain in her buttock, but looked comfortable in bed. States she tried [employer] [facility], [facility] Discharge program once again, but unable to reach them. She was told to try again on Monday. No other complaints this AM.   Objective:  Vital signs in last 24 hours: Vitals:   07/31/17 0418 07/31/17 1500 07/31/17 2121 08/01/17 0500  BP: 126/68 127/69 104/66 126/72  Pulse: 81 81 86 76  Resp: 17  16 16   Temp: 99.3 F (37.4 C) 98.8 F (37.1 C) 98.6 F (37 C) 98.8 F (37.1 C)  TempSrc: Oral Oral Oral Oral  SpO2: 97% 97% 97% 99%  Weight:      Height:        General: appears chronic ill and older than stated age, in bed in no acute distress  Abd: soft, NTND, bowel sounds present, ostomy on LLQ with small amount of stool noted, ostomy bag full of air and with brown stool  Neuro: A&Ox3, able to move all 4 extremities, no focal deficits note  Ext: warm and well perfused, no peripheral edema  Derm: perineal wound bandaged and dressed, yellow drainage and foul smelling odor noted.    Assessment/Plan:  Sherry Baker is a 51 y.o. female with history of distal colorectal adenocarcinonma s/p chemoradiation (completed 04/2017) and AP resection in 06/18/2017 and noncompliance with wound care recommendations who presents with lower back pain.  # Hx of CRC s/p chemoradiation (last tx 04/2017) and APR 06/2017 # Perineal wound dehiscence and open wound: Patient presents with wound dehiscence after recent admission in 9/7 for similar complaint due to noncompliance with wound care at home. Wound cx with rare GPCs and GPR, culture with multiple organisms and no predominant ones. She is currently on vancomycin and cefepime, but per surgery, does not need antibiotic therapy as they believed the wound is colonized and not infected. Per their note, they will not assume care of [facility] or pain medication.  Unfortunately, patient is not  a candidate for SNF or home health services as she is uninsured and she has left AMA from SNF in the past after 24hrs.  - Patient will [facility] with [facility] and SW. She will not need antibiotic therapy as explained above. Per surgery, she will need wet to dry dressing changes  BID and is to follow up with surgery in 2 weeks.   - Will switch to PO oxycodone 10 mg q6h PRN x5 days until she follows up at [facility] next week  - Discharge today    # Hyponatremia:  Improving, Na 133 today. Normal prior to surgery in 06/2017. Baseline 129-131 since surgery. Serum osmolality 273. Consistent with hypotonic hyponatremia.  Urine sodium 87 and urine osm 369. TSH and AM cortisol normal. Suspect this is secondary to reset osmostat from chronic malnutrition.  # Normocytic anemia: Hgb 8.1 today, stable. Normal at baseline but 8-9 since SRGY on 8/16. Iron and TIBC low. Ferritin not ordered as it will likely be elevated in the setting of acute illness.  Believe this is a combination of anemia of chronic disease and iron deficiency anemia.  - Continue PO ferrous sulfate 325 mg QD   # Asymptomatic bacteriuria: UA with leukocytes, TNTC WBC and rare bacteria. Urine culture with multiple species present. Continues to deny urinary symptoms. Will plan to treat if patient becomes symptomatic.   # Malnutrition: Nutrition following.  - Ensure supplements  -  Daily MVI    # HTN: Currently normotensive. Patient not on any antihypertensive at home.  - Will continue to monitor and start antihypertensive as needed   # Alcohol use disorder:  - On CIWA, no ativan ordered   F: None  E: will continue to monitor and replete as needed N: HH + CM diet   VTE ppx: SQ heparin   Code status: Full code, confirmed on admission   Dispo: Anticipated discharge today.  Welford Roche, MD  Internal Medicine PGY-1  P [phone]

## 2017-08-01 NOTE — Care Management Note (Addendum)
Case Management Note  Patient Details  Name: Sherry Baker MRN: [id number] Date of Birth: 11-30-1965  Subjective/Objective:                 Spoke w Brenton Grills, clinical liaison [facility], patient will have [facility] RN and SW through [place] program. Patient does not qualify for [facility] again until March of 2019. Spoke w MD, anticipate PO Augmentin and Flagyl at DC. Coupons can be provided through [facility] for antibiotics, they should total around $20 depending on pharmacy used.   Action/Plan:   Expected Discharge Date:                  Expected Discharge Plan:  [place]  In-House Referral:  Clinical Social Work  Discharge planning Services  CM Consult  Post Acute Care Choice:  Home Health Choice offered to:  Patient  DME Arranged:    DME Agency:  [place]:  RN, Social Work [employer] Agency:     Status of Service:  Completed, signed off  If discussed at [employer], dates discussed:    Additional Comments:  Carles Collet, RN 08/01/2017, 9:52 AM

## 2017-08-01 NOTE — Progress Notes (Signed)
Patient discharged to home via aunt's car at 1815 and will be followed by home health. Discharge instructions provided and patient verbalized understanding. Patient stable. Dressing to bottom changed in afternoon prior to discharge. IV removed and intact.

## 2017-08-02 LAB — CULTURE, BLOOD (ROUTINE X 2)
CULTURE: NO GROWTH
Culture: NO GROWTH
Special Requests: ADEQUATE

## 2017-08-03 LAB — AEROBIC/ANAEROBIC CULTURE W GRAM STAIN (SURGICAL/DEEP WOUND)

## 2017-08-03 LAB — AEROBIC/ANAEROBIC CULTURE (SURGICAL/DEEP WOUND): SPECIAL REQUESTS: NORMAL

## 2017-08-12 ENCOUNTER — Emergency Department (HOSPITAL_COMMUNITY)
Admission: EM | Admit: 2017-08-12 | Discharge: 2017-08-12 | Disposition: A | Discharge status: Home / Self Care | Payer: Self-pay

## 2017-08-12 ENCOUNTER — Telehealth: Payer: Self-pay | Admitting: *Deleted

## 2017-08-12 NOTE — Telephone Encounter (Signed)
Pt friend called for ostomy bags.  Pt is running out and in need. EDCM will work on finding supplies for patient.

## 2017-08-12 NOTE — Consult Note (Addendum)
WOC consulted regarding ostomy supplies, last inpatient stay [facility] nurse provided patient with information on the indigent program and that she must be accountable to follow up on this.  It has been my experience that she was not accountable for this before.  She also was to have [facility] at the time of her discharge which would provide her with supplies as well.   WOC is trying to follow up on what the needs are today, ED reports patient has left the ED.   If patient returns, she uses [facility] # [phone] 1pc ostomy pouches can be ordered from materials. It she has not contacted [facility] for indigent supplies the number is [phone] option #6, she will need to be prepared to provide her current financial information and demographics with Maudie Mercury Administrator, arts).  [place], [place], [place]

## 2017-08-12 NOTE — ED Triage Notes (Signed)
Pt c/o chronic rectal pain. Pt with rectal cancer but not having treatment. Pt has been positive for MRSA.

## 2017-08-13 NOTE — Discharge Summary (Signed)
Name: Sherry Baker MRN: [id number] DOB: 1966/08/13 51 y.o. PCP: Arnoldo Morale, MD  Date of Admission: 07/28/2017 Date of Discharge: 08/01/2017 Attending Physician: Dr Lynnae January  Discharge Diagnosis: 1. Wound dehiscence , normocytic anemia, hyponatremia , malnutrition   Discharge Medications: Allergies as of 08/12/2017      Reactions   Penicillins Hives, Swelling   Has patient had a PCN reaction causing immediate rash, facial/tongue/throat swelling, SOB or lightheadedness with hypotension: Yes Has patient had a PCN reaction causing severe rash involving mucus membranes or skin necrosis: No Has patient had a PCN reaction that required hospitalization No Has patient had a PCN reaction occurring within the last 10 years: Yes If all of the above answers are "NO", then may proceed with Cephalosporin use.      Medication List    ASK your doctor about these medications   carvedilol 6.25 MG tablet Commonly known as:  COREG Take 1 tablet (6.25 mg total) by mouth 2 (two) times daily with a meal.   docusate sodium 100 MG capsule Commonly known as:  COLACE Take 1 capsule (100 mg total) by mouth 2 (two) times daily.   feeding supplement (ENSURE ENLIVE) Liqd Take 237 mLs by mouth 3 (three) times daily between meals.   feeding supplement (PRO-STAT SUGAR FREE 64) Liqd Take 30 mLs by mouth 2 (two) times daily.   ferrous sulfate 325 (65 FE) MG tablet Take 1 tablet (325 mg total) by mouth 2 (two) times daily with a meal.   FLUoxetine 20 MG tablet Commonly known as:  PROZAC Take 1 tablet (20 mg total) by mouth daily.   multivitamin with minerals Tabs tablet Take 1 tablet by mouth daily.   naproxen sodium 220 MG tablet Commonly known as:  ANAPROX Take 220 mg by mouth 2 (two) times daily as needed (pain).   nicotine 7 mg/24hr patch Commonly known as:  NICODERM CQ - dosed in mg/24 hr Place 1 patch (7 mg total) onto the skin daily.   Oxycodone HCl 10 MG Tabs Commonly known as:   ROXICODONE Take 0.5 tablets (5 mg total) by mouth every 8 (eight) hours as needed.   pantoprazole 40 MG tablet Commonly known as:  PROTONIX Take 1 tablet (40 mg total) by mouth daily.       Disposition and follow-up:   [name] was discharged from [facility] in [place] condition.  At the hospital follow up visit please address  1.  Perineal wound dehiscence- please ensure continued compliance with wound care. Follow up with her surgeon outpatient  2.  Labs / imaging needed at time of follow-up:   3.  Pending labs/ test needing follow-up:   Follow-up Appointments: [facility] On[facility] 11/8  Hospital Course by problem list:   # Perineal wound dehiscence: Pt with history of  rectal cancer who completed chemotherapy and radiation and June 2018 and then had a abdominal perineal resection on August 16. She was discharged to home on August 20. She was readmitted on September 7 after a fall and wound drainage. She had a wound in the perineum and was discharged on the 18th to skilled nursing facility. She signed out AMA after 24 hours. On admission, she states she signed out because at her facility was not in [place] and therefore family could not come and visit her. She presents after running out of ostomy bags and creating her own. Wound cx with rare GPCs and GPR, culture with multiple organisms and no predominant ones.  Unfortunately, patient is not a candidate for SNF or home health services as she is uninsured and she has left AMA from SNF in the past after 24hrs.  Surgery was consulted who believed she was not a surgical candidate. They strongly recommend long-term placement for wound care, nutrition care, and ostomy. Wound care, and social work were consulted. She was given vanc and cefepime. Pain control was managed with IV dilaudid and oxy PRN. Social work assisted with [facility] which has a Production manager. Surgery recommended stopping the  antibiotics as per them she does not have wound infection and recommended going to wet and dressings as the dakins was just killing the granulation tissue.  She was given prescription for oxycodone and asked to follow up with [place] on discharge. And was set up with [facility]    # Hyponatremia: Normal prior to surgery in 06/2017. Baseline 129-131 since surgery. Serum osmolality 273. Consistent with hypotonic hyponatremia.  Urine sodium 87 and urine osm 369. TSH and AM cortisol normal. Suspect this is secondary to reset osmostat from chronic malnutrition.  # Normocytic anemia: Hgb 8.1 today, stable. Normal at baseline but 8-9 since SRGY on 8/16. Iron and TIBC low. Ferritin not ordered as it will likely be elevated in the setting of acute illness.  Believe this is a combination of anemia of chronic disease and iron deficiency anemia.  Continued iron on discharge.  # Malnutrition: Nutrition consulted  # HTN: normotensive. Patient not on any antihypertensive at home.   # Alcohol use disorder:  Was kept on CIWA  Discharge Vitals:   126/58 HR 90 Temp 98.3 RR 17  Pertinent Labs, Studies, and Procedures:  Na 130 baseline since Aug K 4 Cr 0.37 Alb 1.9 WBC 4.1 HgB 8.5 lower than June baseline but stable since Aug MCV 82.7 RDW 17.6  Wound culture 9/26- RARE WBC PRESENT, PREDOMINANTLY PMN  RARE GRAM POSITIVE COCCI  RARE GRAM POSITIVE RODS  Discharge Instructions: Please follow up with [facility] and continue wound care  Signed: Burgess Estelle, MD 08/13/2017, 10:14 AM

## 2017-08-13 NOTE — Discharge Summary (Deleted)
  The note originally documented on this encounter has been moved the the encounter in which it belongs.  

## 2017-08-18 ENCOUNTER — Telehealth: Payer: Self-pay

## 2017-08-18 ENCOUNTER — Telehealth: Payer: Self-pay | Admitting: Family Medicine

## 2017-08-18 NOTE — Telephone Encounter (Signed)
Pt returned [facility] called tried contacting [facility] to see if she is available no answer. Pt is requesting a call back if possible. Please f/u

## 2017-08-18 NOTE — Telephone Encounter (Signed)
Call received from the patient.  Inquired if she has contacted [employer] supply program. She said that she had not called yet and was " getting ready to call in the morning."  This CM provided her with the phone # which she repeated back correctly and this CM encouraged her to call as soon as she hung up the phone.  She said that she would and stated that she  appreciated the call to check on her. This CM stressed the need to contact [facility] in order to receive the supplies she needs. She said that has the product # that is needed. Reminded her that she should schedule a follow up appointment with Dr Jarold Song.  There are not any appointments available right now, but she should call at a later time to check for availability. She stated that she understood and requested that a letter be sent to her to remind her if she forgets to schedule the appointment.

## 2017-08-18 NOTE — Telephone Encounter (Signed)
Call returned to the patient # [phone] and a HIPAA compliant message was left requesting a call back to # [phone].

## 2017-08-18 NOTE — Telephone Encounter (Signed)
Call placed to patient [phone] to check on her status and ask if she had reached out to [facility] [phone]) to order her supplies. No answer. Left patient a message to return my call at [phone].  An update will be sent to Haze Justin (RN CM).

## 2017-08-21 ENCOUNTER — Telehealth: Payer: Self-pay

## 2017-08-21 ENCOUNTER — Telehealth: Payer: Self-pay | Admitting: Family Medicine

## 2017-08-21 NOTE — Telephone Encounter (Signed)
Call placed to patient to inform her that we have an appointment available on Monday 10/22 at 1:30pm and if we can schedule her for a hospital follow up. No answer. Left patient a message to return my call at [phone].

## 2017-08-21 NOTE — Telephone Encounter (Addendum)
Call placed to the patient to inquire if she contacted [facility].  She said that she called and the supplies ( bags) were delivered yesterday. She still needs a bottle of saline and some ABD pads. She said that she has no income but has a friend that she will ask for money to pay for those supplies from a local pharmacy.   She also noted that she is going to DSS at 1300 today to check on the status of her medicaid application. No appointments available at [facility] at this time.  She requested a letter to remind her to call for an appointment.  Letter sent to patient. No other questions reported at this time

## 2017-08-29 ENCOUNTER — Emergency Department (HOSPITAL_COMMUNITY): Payer: Medicaid Other

## 2017-08-29 ENCOUNTER — Emergency Department (HOSPITAL_COMMUNITY)
Admission: EM | Admit: 2017-08-29 | Discharge: 2017-08-29 | Discharge status: Against Medical Advice | Payer: Medicaid Other | Attending: Emergency Medicine | Admitting: Emergency Medicine

## 2017-08-29 ENCOUNTER — Encounter (HOSPITAL_COMMUNITY): Payer: Self-pay | Admitting: Emergency Medicine

## 2017-08-29 DIAGNOSIS — K6289 Other specified diseases of anus and rectum: Secondary | ICD-10-CM

## 2017-08-29 DIAGNOSIS — Z79899 Other long term (current) drug therapy: Secondary | ICD-10-CM | POA: Insufficient documentation

## 2017-08-29 DIAGNOSIS — Z85040 Personal history of malignant carcinoid tumor of rectum: Secondary | ICD-10-CM | POA: Insufficient documentation

## 2017-08-29 DIAGNOSIS — I10 Essential (primary) hypertension: Secondary | ICD-10-CM | POA: Diagnosis not present

## 2017-08-29 DIAGNOSIS — F17210 Nicotine dependence, cigarettes, uncomplicated: Secondary | ICD-10-CM | POA: Insufficient documentation

## 2017-08-29 DIAGNOSIS — Z5320 Procedure and treatment not carried out because of patient's decision for unspecified reasons: Secondary | ICD-10-CM | POA: Diagnosis not present

## 2017-08-29 LAB — COMPREHENSIVE METABOLIC PANEL
ALBUMIN: 2.9 g/dL — AB (ref 3.5–5.0)
ALK PHOS: 78 U/L (ref 38–126)
ALT: 8 U/L — ABNORMAL LOW (ref 14–54)
ANION GAP: 14 (ref 5–15)
AST: 17 U/L (ref 15–41)
BILIRUBIN TOTAL: 0.4 mg/dL (ref 0.3–1.2)
BUN: 6 mg/dL (ref 6–20)
CO2: 20 mmol/L — AB (ref 22–32)
Calcium: 8.5 mg/dL — ABNORMAL LOW (ref 8.9–10.3)
Chloride: 97 mmol/L — ABNORMAL LOW (ref 101–111)
Creatinine, Ser: 0.38 mg/dL — ABNORMAL LOW (ref 0.44–1.00)
GFR calc Af Amer: >60 mL/min (ref >60)
GFR calc non Af Amer: >60 mL/min (ref >60)
GLUCOSE: 91 mg/dL (ref 65–99)
POTASSIUM: 4 mmol/L (ref 3.5–5.1)
SODIUM: 131 mmol/L — AB (ref 135–145)
TOTAL PROTEIN: 7.9 g/dL (ref 6.5–8.1)

## 2017-08-29 LAB — CBC WITH DIFFERENTIAL/PLATELET
BASOS ABS: 0 10*3/uL (ref 0.0–0.1)
BASOS PCT: 0 %
EOS ABS: 0 10*3/uL (ref 0.0–0.7)
Eosinophils Relative: 0 %
HEMATOCRIT: 33.4 % — AB (ref 36.0–46.0)
HEMOGLOBIN: 10.6 g/dL — AB (ref 12.0–15.0)
Lymphocytes Relative: 17 %
Lymphs Abs: 1.8 10*3/uL (ref 0.7–4.0)
MCH: 25.2 pg — ABNORMAL LOW (ref 26.0–34.0)
MCHC: 31.7 g/dL (ref 30.0–36.0)
MCV: 79.3 fL (ref 78.0–100.0)
Monocytes Absolute: 1.7 10*3/uL — ABNORMAL HIGH (ref 0.1–1.0)
Monocytes Relative: 16 %
NEUTROS ABS: 7.1 10*3/uL (ref 1.7–7.7)
NEUTROS PCT: 67 %
Platelets: 330 10*3/uL (ref 150–400)
RBC: 4.21 MIL/uL (ref 3.87–5.11)
RDW: 18.7 % — AB (ref 11.5–15.5)
WBC: 10.7 10*3/uL — ABNORMAL HIGH (ref 4.0–10.5)

## 2017-08-29 LAB — LIPASE, BLOOD: Lipase: 35 U/L (ref 11–51)

## 2017-08-29 MED ORDER — IOPAMIDOL (ISOVUE-300) INJECTION 61%: 100.0000 mL | Freq: Once | INTRAVENOUS | Status: DC | PRN

## 2017-08-29 MED ORDER — ONDANSETRON HCL 4 MG/2ML IJ SOLN: 4.0000 mg | Freq: Once | INTRAMUSCULAR | Status: DC

## 2017-08-29 MED ORDER — SODIUM CHLORIDE 0.9 % IV BOLUS (SEPSIS): 1000.0000 mL | Freq: Once | INTRAVENOUS | Status: DC

## 2017-08-29 MED ORDER — MORPHINE SULFATE (PF) 4 MG/ML IV SOLN: 4.0000 mg | Freq: Once | INTRAVENOUS | Status: DC

## 2017-08-29 MED ORDER — SODIUM CHLORIDE 0.9 % IV SOLN: INTRAVENOUS | Status: DC

## 2017-08-29 NOTE — ED Provider Notes (Signed)
[facility] DEPT Provider Note   CSN: [id number] Arrival date & time: 08/29/17  9983     History   Chief Complaint Chief Complaint  Patient presents with  . Rectal Pain    HPI Sherry Baker is a 51 y.o. female.  51 year old female with history of rectal cancer and poor follow-up presents with 1 day history of nonbilious vomiting with associated abdominal cramping.  Cramping is persistent and diffuse and worse after she vomits.  Nothing makes it better.  No fever or chills.  Denies any urinary symptoms.  Has a history of a colostomy bag and has noted decreased output.  Denies any bleeding from her rectum.  She has had a resection and has granulation tissue in that area.  No treatment used prior to arrival      Past Medical History:  Diagnosis Date  . Alcohol abuse   . Allergy    PCNS swelling  . Arthritis   . Bipolar 1 disorder ([place])   . Cancer ([place]) 01/21/2017   rectal cancer  . Cirrhosis of liver ([place])   . Depression   . Genetic testing 03/24/2017   Ms. Novella underwent genetic counseling and testing for hereditary cancer syndromes on 02/17/2017. Her results were negative for mutations in all 46 genes analyzed by [facility]'s 46-gene Common Hereditary Cancers Panel. Genes analyzed include: APC, ATM, AXIN2, BARD1, BMPR1A, BRCA1, BRCA2, BRIP1, CDH1, CDKN2A, CHEK2, CTNNA1, DICER1, EPCAM, GREM1, HOXB13, KIT, MEN1, MLH1, MSH2, MSH3, MSH6, MUTYH, NBN,  . Hypertension     Patient Active Problem List   Diagnosis Date Noted  . Wound dehiscence 07/10/2017  . Genetic testing 03/24/2017  . Hypertension 02/18/2017  . GERD (gastroesophageal reflux disease) 02/18/2017  . Rectal cancer ([place]) 01/27/2017  . Abdominal pain, epigastric   . Rectal mass   . Fever   . Anemia 01/16/2017  . Abdominal pain   . Constipation   . Alcohol abuse   . Tobacco abuse   . Cocaine abuse ([place])   . Bipolar affective disorder ([place])   . Protein-calorie malnutrition, severe  ([place]) 02/13/2014  . Suicide ideation 02/12/2014  . Suicidal ideation 02/11/2014  . Alcohol intoxication ([place]) 02/11/2014  . Transaminitis 02/11/2014  . Substance abuse ([place]) 02/11/2014  . Depression 02/11/2014  . Thrombocytopenia ([place]) 02/11/2014    Past Surgical History:  Procedure Laterality Date  . ABDOMINAL PERINEAL BOWEL RESECTION N/A 06/18/2017   Procedure: ABDOMINAL PERINEAL RESECTION ERAS PATHWAY;  Surgeon: Leighton Ruff, MD;  Location: [facility];  Service: General;  Laterality: N/A;  . COLONOSCOPY WITH PROPOFOL Left 01/21/2017   Procedure: COLONOSCOPY WITH PROPOFOL;  Surgeon: Otis Brace, MD;  Location: [facility];  Service: Gastroenterology;  Laterality: Left;  . FRACTURE SURGERY    . MANDIBLE FRACTURE SURGERY      OB History    No data available       Home Medications    Prior to Admission medications   Medication Sig Start Date End Date Taking? Authorizing Provider  Amino Acids-Protein Hydrolys (FEEDING SUPPLEMENT, PRO-STAT SUGAR FREE 64,) LIQD Take 30 mLs by mouth 2 (two) times daily. Patient not taking: Reported on 07/28/2017 3/82/50   Leighton Ruff, MD  carvedilol (COREG) 6.25 MG tablet Take 1 tablet (6.25 mg total) by mouth 2 (two) times daily with a meal. Patient not taking: Reported on 07/28/2017 5/39/76   Leighton Ruff, MD  docusate sodium (COLACE) 100 MG capsule Take 1 capsule (100 mg total) by mouth 2 (two) times daily. Patient not taking:  Reported on 07/28/2017 5/40/08   Leighton Ruff, MD  feeding supplement, ENSURE ENLIVE, (ENSURE ENLIVE) LIQD Take 237 mLs by mouth 3 (three) times daily between meals. Patient not taking: Reported on 07/28/2017 6/76/19   Leighton Ruff, MD  ferrous sulfate 325 (65 FE) MG tablet Take 1 tablet (325 mg total) by mouth 2 (two) times daily with a meal. Patient not taking: Reported on 07/28/2017 03/12/31   Leighton Ruff, MD  FLUoxetine (PROZAC) 20 MG tablet Take 1 tablet (20 mg total) by mouth daily. Patient not taking:  Reported on 07/28/2017 6/71/24   Leighton Ruff, MD  Multiple Vitamin (MULTIVITAMIN WITH MINERALS) TABS tablet Take 1 tablet by mouth daily. Patient not taking: Reported on 07/28/2017 5/80/99   Leighton Ruff, MD  naproxen sodium (ANAPROX) 220 MG tablet Take 220 mg by mouth 2 (two) times daily as needed (pain).    [provider]  nicotine (NICODERM CQ - DOSED IN MG/24 HR) 7 mg/24hr patch Place 1 patch (7 mg total) onto the skin daily. Patient not taking: Reported on 07/28/2017 8/33/82   Leighton Ruff, MD  Oxycodone HCl 10 MG TABS Take 0.5 tablets (5 mg total) by mouth every 8 (eight) hours as needed. 08/01/17 08/01/18  Santos-Sanchez, Merlene Morse, MD  pantoprazole (PROTONIX) 40 MG tablet Take 1 tablet (40 mg total) by mouth daily. Patient not taking: Reported on 07/28/2017 03/07/38   Leighton Ruff, MD    Family History Family History  Problem Relation Age of Onset  . Cancer Mother 42       Originating in the abdomen, otherwise unknwon  . Cancer - Other Maternal Aunt        Throat    Social History Social History  Substance Use Topics  . Smoking status: Current Every Day Smoker    Packs/day: 0.50    Years: 39.00    Types: Cigarettes  . Smokeless tobacco: Never Used  . Alcohol use Yes     Comment: 07/28/2017 "40oz beer/day; if I have it"     Allergies   Penicillins   Review of Systems Review of Systems  All other systems reviewed and are negative.    Physical Exam Updated Vital Signs BP 98/64 (BP Location: Right Arm)   Pulse 100   Temp 98.1 F (36.7 C) (Oral)   Resp (!) 22   Ht 1.575 m (_0 )   Wt 49 kg (108 lb)   SpO2 99%   BMI 19.75 kg/m   Physical Exam  Constitutional: She is oriented to person, place, and time. She appears well-developed and well-nourished.  Non-toxic appearance. No distress.  HENT:  Head: Normocephalic and atraumatic.  Eyes: Pupils are equal, round, and reactive to light. Conjunctivae, EOM and lids are normal.  Neck: Normal range of  motion. Neck supple. No tracheal deviation present. No thyroid mass present.  Cardiovascular: Normal rate, regular rhythm and normal heart sounds.  Exam reveals no gallop.   No murmur heard. Pulmonary/Chest: Effort normal and breath sounds normal. No stridor. No respiratory distress. She has no decreased breath sounds. She has no wheezes. She has no rhonchi. She has no rales.  Abdominal: Soft. Normal appearance and bowel sounds are normal. She exhibits distension. There is generalized tenderness. There is no rebound and no CVA tenderness.    Genitourinary:     Musculoskeletal: Normal range of motion. She exhibits no edema or tenderness.  Neurological: She is alert and oriented to person, place, and time. She has normal strength. No cranial nerve deficit or  sensory deficit. GCS eye subscore is 4. GCS verbal subscore is 5. GCS motor subscore is 6.  Skin: Skin is warm and dry. No abrasion and no rash noted.  Psychiatric: She has a normal mood and affect. Her speech is normal and behavior is normal.  Nursing note and vitals reviewed.    ED Treatments / Results  Labs (all labs ordered are listed, but only abnormal results are displayed) Labs Reviewed  CBC WITH DIFFERENTIAL/PLATELET  COMPREHENSIVE METABOLIC PANEL  LIPASE, BLOOD    EKG  EKG Interpretation None       Radiology No results found.  Procedures Procedures (including critical care time)  Medications Ordered in ED Medications  0.9 %  sodium chloride infusion (not administered)  sodium chloride 0.9 % bolus 1,000 mL (not administered)  morphine 4 MG/ML injection 4 mg (not administered)  ondansetron (ZOFRAN) injection 4 mg (not administered)     Initial Impression / Assessment and Plan / ED Course  I have reviewed the triage vital signs and the nursing notes.  Pertinent labs & imaging results that were available during my care of the patient were reviewed by me and considered in my medical decision making (see  chart for details).     Patient ordered pain medication as well as workup for possible obstruction versus other intra-abdominal pathology.  Patient has eloped  Final Clinical Impressions(s) / ED Diagnoses   Final diagnoses:  None    New Prescriptions New Prescriptions   No medications on file     Lacretia Leigh, MD 08/29/17 1123

## 2017-08-29 NOTE — ED Notes (Signed)
Notified MD that pt cannot be found. Will look one more time.

## 2017-08-29 NOTE — ED Notes (Signed)
Pt is not in room, attempting to locate now.

## 2017-08-29 NOTE — ED Notes (Signed)
Bed: UK38 Expected date:  Expected time:  Means of arrival:  Comments: EMS-rectal

## 2017-08-29 NOTE — ED Triage Notes (Signed)
Pt has rectal cancer and is complaining of lower abdominal pain. Pt does has history of Bed Bugs. [facility] stated they did not see any thus far. Pt has finished Chemo 1+ month ago. Pt does have Colostomy bag. Pt is AOx4 . Pt does not have Doctor that pt follows up with.

## 2017-09-10 ENCOUNTER — Other Ambulatory Visit: Payer: Self-pay

## 2017-09-10 ENCOUNTER — Ambulatory Visit: Payer: Self-pay | Admitting: Nurse Practitioner

## 2017-10-02 ENCOUNTER — Telehealth: Payer: Self-pay | Admitting: *Deleted

## 2017-10-02 NOTE — Telephone Encounter (Signed)
[facility] from [facility] reporting pt needs an appt and is out of ostomy supplies. She also reports pt's abdomen is "swollen".  [facility] provided phone # for pt. Called pt with appt for 11/4 @ 1145. Pt states her bowels are moving but her abdomen is swollen. She is eating and drinking. Informed her ostomy supplies will be left at front desk for pick up. She voiced appreciation for call.

## 2017-10-06 ENCOUNTER — Telehealth: Payer: Self-pay | Admitting: Nurse Practitioner

## 2017-10-06 NOTE — Telephone Encounter (Signed)
Patient came in today and thought she had an appt - went a head and r/s'd missed appt from 11/8 .

## 2017-10-07 ENCOUNTER — Ambulatory Visit (HOSPITAL_BASED_OUTPATIENT_CLINIC_OR_DEPARTMENT_OTHER): Payer: Self-pay | Admitting: Nurse Practitioner

## 2017-10-07 ENCOUNTER — Encounter: Payer: Self-pay | Admitting: Nurse Practitioner

## 2017-10-07 ENCOUNTER — Other Ambulatory Visit (HOSPITAL_BASED_OUTPATIENT_CLINIC_OR_DEPARTMENT_OTHER): Payer: Self-pay

## 2017-10-07 VITALS — BP 102/62 | HR 97 | Temp 97.8°F | Resp 16 | Ht 62.0 in | Wt 126.8 lb

## 2017-10-07 DIAGNOSIS — D509 Iron deficiency anemia, unspecified: Secondary | ICD-10-CM

## 2017-10-07 DIAGNOSIS — C20 Malignant neoplasm of rectum: Secondary | ICD-10-CM

## 2017-10-07 DIAGNOSIS — S31502A Unspecified open wound of unspecified external genital organs, female, initial encounter: Secondary | ICD-10-CM

## 2017-10-07 DIAGNOSIS — R110 Nausea: Secondary | ICD-10-CM

## 2017-10-07 LAB — CBC WITH DIFFERENTIAL/PLATELET
BASO%: 0.5 % (ref 0.0–2.0)
Basophils Absolute: 0 10*3/uL (ref 0.0–0.1)
EOS%: 2.4 % (ref 0.0–7.0)
Eosinophils Absolute: 0.2 10*3/uL (ref 0.0–0.5)
HCT: 31.5 % — ABNORMAL LOW (ref 34.8–46.6)
HEMOGLOBIN: 9.7 g/dL — AB (ref 11.6–15.9)
LYMPH%: 31.6 % (ref 14.0–49.7)
MCH: 22.7 pg — AB (ref 25.1–34.0)
MCHC: 30.6 g/dL — ABNORMAL LOW (ref 31.5–36.0)
MCV: 74.2 fL — ABNORMAL LOW (ref 79.5–101.0)
MONO#: 1 10*3/uL — ABNORMAL HIGH (ref 0.1–0.9)
MONO%: 14.7 % — AB (ref 0.0–14.0)
NEUT%: 50.8 % (ref 38.4–76.8)
NEUTROS ABS: 3.6 10*3/uL (ref 1.5–6.5)
Platelets: 343 10*3/uL (ref 145–400)
RBC: 4.24 10*6/uL (ref 3.70–5.45)
RDW: 20.2 % — AB (ref 11.2–14.5)
WBC: 7.1 10*3/uL (ref 3.9–10.3)
lymph#: 2.2 10*3/uL (ref 0.9–3.3)

## 2017-10-07 LAB — CEA (IN HOUSE-CHCC): CEA (CHCC-IN HOUSE): 4.92 ng/mL (ref 0.00–5.00)

## 2017-10-07 NOTE — Progress Notes (Signed)
  [facility] OFFICE PROGRESS NOTE   Diagnosis: Rectal cancer  INTERVAL HISTORY:   [name] was last seen by Dr. Benay Spice 07/15/2017 during a hospitalization for wound dehiscence.  He reviewed the surgical pathology report with Sherry Baker at that time.  Dr. Benay Spice indicated he would recommend completing a course of adjuvant Xeloda if the wound healed and her performance status improved.  Her case was discussed with Dr. Marcello Moores who did not feel the wound would heal in the near future.  Adjuvant therapy was not recommended in her case.  She reports continues intermittent pain around the stoma and the "rectum".  She thinks the perineal wound is better.  She continues to note drainage.  No fever.  The colostomy is functioning normally.  She is having some difficulty obtaining colostomy supplies.  She reports a good appetite.  She is gaining weight.  She has occasional nausea.  Objective:  Vital signs in last 24 hours:  Blood pressure 102/62, pulse 97, temperature 97.8 F (36.6 C), temperature source Oral, resp. rate 16, height '5\' 2"'$  (1.575 m), weight 126 lb 12.8 oz (57.5 kg), SpO2 97 %.    HEENT: Poor dentition.  No thrush or ulcers. Resp: Lungs clear bilaterally. Cardio: Regular rate and rhythm. GI: Abdomen is soft.  No hepatomegaly.  Lower quadrant colostomy.  Tender around the stoma.  Large wound at the perineum with what appears to be a large area of granulation tissue without evidence of overt infection.  Thin liquid drainage noted. Vascular: No leg edema. Neuro: Alert and oriented.    Lab Results:  Lab Results  Component Value Date   WBC 7.1 10/07/2017   HGB 9.7 (L) 10/07/2017   HCT 31.5 (L) 10/07/2017   MCV 74.2 (L) 10/07/2017   PLT 343 10/07/2017   NEUTROABS 3.6 10/07/2017    Imaging:  No results found.  Medications: I have reviewed the patient's current medications.  Assessment/Plan: 1. Rectal cancer-distal rectal mass noted on colonoscopy  01/21/2017 with a biopsy confirming adenocarcinoma  ? CT abdomen/pelvis 01/17/2017 and CT chest 01/19/2017 with no evidence of distant metastatic disease ? MRI 02/18/2017-T3 N0, 6.5 cm from the anal verge ? Radiation/Xeloda initiated 02/17/2017; She did not receive radiation 03/04/2017 through 03/11/2017;received radiation 03/12/2017 and 03/13/2017;radiation then resumed 04/03/2017; she continued Xeloda, radiation completed 05/01/2017 ? APR 06/18/2018, ypT3,ypN0 tumor. MSI-stable, no loss of mismatch repair protein expression. Negative margins.  2. Iron deficiency anemia secondary to #1  3. Polysubstance abuse  4. Tongue ulcer 04/21/2017-question mucositis related to Xeloda.  5.   Wound Dehiscence- Admission 07/10/2017   Disposition: Sherry Baker appears stable.  She is now greater than 3 months out from the APR procedure. She understands Dr. Benay Spice did not recommend adjuvant chemotherapy due to the perineal wound.  We will follow-up on the CEA from today.  She has a persistent wound at the perineum.  We contacted Dr. Manon Hilding office to request evaluation of the perineal wound and parastomal pain/tenderness.  She has significant transportation issues but does have transportation available today.  She will be seen in Dr. Manon Hilding office this afternoon.  The [place] [facility] nurse navigator, [facility], met with her today to help with the colostomy supplies.  She will return for a follow-up visit here in 2-3 months.  She will contact the office in the interim with any problems.  Ned Card ANP/GNP-BC   10/07/2017  2:52 PM

## 2017-10-08 ENCOUNTER — Telehealth: Payer: Self-pay | Admitting: *Deleted

## 2017-10-08 ENCOUNTER — Telehealth: Payer: Self-pay | Admitting: Emergency Medicine

## 2017-10-08 NOTE — Telephone Encounter (Signed)
-----   Message from Ladell Pier, MD sent at 10/08/2017 11:38 AM EST ----- Start ferrous sulfate 325mg  bid, office and cbc 1 month

## 2017-10-08 NOTE — Telephone Encounter (Signed)
Pt care giver verbalized understanding of taking iron 325mg  BID. Scheduling message sent for CBC and office visit in 1 month. Dr.sherrill reccomened pt take tylenol and ibuprofen for abd pain and to go to ER if pain persists. Pt care giver verbalized understanding of this.

## 2017-10-09 ENCOUNTER — Telehealth: Payer: Self-pay | Admitting: Oncology

## 2017-10-09 NOTE — Telephone Encounter (Signed)
Spoke to patient regarding upcoming February appointments.  °

## 2017-10-10 ENCOUNTER — Telehealth: Payer: Self-pay | Admitting: Oncology

## 2017-10-10 NOTE — Telephone Encounter (Signed)
Called patient regarding appointment she was thinking it was in February. I sent her calendar

## 2017-10-14 ENCOUNTER — Telehealth: Payer: Self-pay | Admitting: *Deleted

## 2017-10-14 NOTE — Telephone Encounter (Signed)
Patient requesting a letter stating that she unable to work due to a medical issue so that she can qualify for food stamps.

## 2017-10-16 ENCOUNTER — Telehealth: Payer: Self-pay | Admitting: *Deleted

## 2017-10-16 ENCOUNTER — Encounter: Payer: Self-pay | Admitting: Nurse Practitioner

## 2017-10-16 NOTE — Telephone Encounter (Signed)
Left message informing pt the letter she requested is ready for pick up.

## 2017-10-16 NOTE — Telephone Encounter (Signed)
Call from "[employer]" requesting "letter for patient.  DSS ask for letter stating she is unable to work to help qualify for food stamps.  She needs help.  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  Call her [phone].  She's tried to call office, unable to reach, loosing phone minutes so she asked me to call. I'll drive her there when needed.  She really needs help, has no one so if I don't help no one else will."  Letter completed, ready for pick up at front registation desk..  Very thankful and happy.

## 2017-10-20 ENCOUNTER — Telehealth: Payer: Self-pay | Admitting: *Deleted

## 2017-10-20 NOTE — Telephone Encounter (Signed)
Returned call from triage line regarding a medication that was making the patient sick and she has stopped taking. Left message to call clinic.

## 2017-11-02 ENCOUNTER — Telehealth: Payer: Self-pay

## 2017-11-02 NOTE — Telephone Encounter (Signed)
Patient called requesting colostomy bags from our office. Returned patient call and informed her that we do not have any of the bags that she needs available and that our supply is donated by patients that no longer need them. Arna Snipe, nurse navigator spoke with patient earlier, before calls, regarding request for colostomy supplies. Information communicated by [facility], reiterated. Patient voiced understanding, but tearful. Emotional support given and patient thankful.

## 2017-11-06 ENCOUNTER — Telehealth: Payer: Self-pay

## 2017-11-06 ENCOUNTER — Other Ambulatory Visit: Payer: Self-pay | Admitting: Nurse Practitioner

## 2017-11-06 DIAGNOSIS — C20 Malignant neoplasm of rectum: Secondary | ICD-10-CM

## 2017-11-06 NOTE — Telephone Encounter (Signed)
(  [facility]) called to inform that patient reports that she " has an infection around colostomy". Called to inquire about coming in today versus Monday. Returned call and left voicemail for patient. Per Dr. Benay Spice, it would be best to go to the walk-in clinic at her surgeon's office or to the ED to manage reported symptoms. Instructed to call back with any further questions.

## 2017-11-09 ENCOUNTER — Other Ambulatory Visit: Payer: Self-pay

## 2017-11-09 ENCOUNTER — Telehealth: Payer: Self-pay | Admitting: Nurse Practitioner

## 2017-11-09 ENCOUNTER — Ambulatory Visit: Payer: Self-pay | Admitting: Nurse Practitioner

## 2017-11-09 NOTE — Telephone Encounter (Signed)
Patient called in to reschedule  °

## 2017-11-11 ENCOUNTER — Ambulatory Visit: Payer: Self-pay | Admitting: Nurse Practitioner

## 2017-11-11 ENCOUNTER — Other Ambulatory Visit: Payer: Self-pay

## 2017-11-12 ENCOUNTER — Telehealth: Payer: Self-pay | Admitting: *Deleted

## 2017-11-12 NOTE — Telephone Encounter (Signed)
Pt called with concerns regarding ostomy bags. Pt does not have insurance, requesting help. Pt given ostomy bag and supplies, gave pt phone number to [facility] and [place] for Patient assistance programs and free ostomy supplies. Discussed with pt above information, pt verbalized thanks and will be here today to pick up. No further concerns.

## 2017-11-17 ENCOUNTER — Telehealth: Payer: Self-pay | Admitting: *Deleted

## 2017-11-17 ENCOUNTER — Emergency Department (HOSPITAL_COMMUNITY)
Admission: EM | Admit: 2017-11-17 | Discharge: 2017-11-17 | Disposition: A | Discharge status: Home / Self Care | Payer: Medicaid Other | Attending: Emergency Medicine | Admitting: Emergency Medicine

## 2017-11-17 DIAGNOSIS — R21 Rash and other nonspecific skin eruption: Secondary | ICD-10-CM | POA: Insufficient documentation

## 2017-11-17 DIAGNOSIS — K9400 Colostomy complication, unspecified: Secondary | ICD-10-CM | POA: Insufficient documentation

## 2017-11-17 NOTE — ED Notes (Signed)
Alert, NAD, calm, interactive, resps e/u, speaking in clear complete sentences, no dyspnea noted, skin W&D, VSS, "ready to go", c/o abd pain at colostomy site, site CDI, (denies: sob, nausea, dizziness or visual changes), states, "ready to go". Denies questions or needs. Out to d/c desk via w/c, taxi voucher in place, taxi called.

## 2017-11-17 NOTE — ED Provider Notes (Signed)
[facility] EMERGENCY DEPARTMENT Provider Note   CSN: [id number] Arrival date & time: 11/17/17  0018     History   Chief Complaint Chief Complaint  Patient presents with  . Post-op Problem    HPI Sher[facility] Baker is a 52 y.o. female.  Brought to the emergency department from home.  She has a colostomy in place, has run out of her colostomy bags.  She reports that the bags she has are the wrong ones.  She last changed the bag 4 days ago.  It has not been sticking to her abdomen and has been leaking.  She reports that the entire area around her ostomy is very painful and the skin is peeling off.      Past Medical History:  Diagnosis Date  . Alcohol abuse   . Allergy    PCNS swelling  . Arthritis   . Bipolar 1 disorder ([place])   . Cancer ([place]) 01/21/2017   rectal cancer  . Cirrhosis of liver ([place])   . Depression   . Genetic testing 03/24/2017   Ms. Mathisen underwent genetic counseling and testing for hereditary cancer syndromes on 02/17/2017. Her results were negative for mutations in all 46 genes analyzed by [facility]'s 46-gene Common Hereditary Cancers Panel. Genes analyzed include: APC, ATM, AXIN2, BARD1, BMPR1A, BRCA1, BRCA2, BRIP1, CDH1, CDKN2A, CHEK2, CTNNA1, DICER1, EPCAM, GREM1, HOXB13, KIT, MEN1, MLH1, MSH2, MSH3, MSH6, MUTYH, NBN,  . Hypertension     Patient Active Problem List   Diagnosis Date Noted  . Wound dehiscence 07/10/2017  . Genetic testing 03/24/2017  . Hypertension 02/18/2017  . GERD (gastroesophageal reflux disease) 02/18/2017  . Rectal cancer ([place]) 01/27/2017  . Abdominal pain, epigastric   . Rectal mass   . Fever   . Anemia 01/16/2017  . Abdominal pain   . Constipation   . Alcohol abuse   . Tobacco abuse   . Cocaine abuse ([place])   . Bipolar affective disorder ([place])   . Protein-calorie malnutrition, severe ([place]) 02/13/2014  . Suicide ideation 02/12/2014  . Suicidal ideation 02/11/2014  . Alcohol intoxication ([place]) 02/11/2014  .  Transaminitis 02/11/2014  . Substance abuse ([place]) 02/11/2014  . Depression 02/11/2014  . Thrombocytopenia ([place]) 02/11/2014    Past Surgical History:  Procedure Laterality Date  . ABDOMINAL PERINEAL BOWEL RESECTION N/A 06/18/2017   Procedure: ABDOMINAL PERINEAL RESECTION ERAS PATHWAY;  Surgeon: Leighton Ruff, MD;  Location: [facility];  Service: General;  Laterality: N/A;  . COLONOSCOPY WITH PROPOFOL Left 01/21/2017   Procedure: COLONOSCOPY WITH PROPOFOL;  Surgeon: Otis Brace, MD;  Location: [facility];  Service: Gastroenterology;  Laterality: Left;  . FRACTURE SURGERY    . MANDIBLE FRACTURE SURGERY      OB History    No data available       Home Medications    Prior to Admission medications   Medication Sig Start Date End Date Taking? Authorizing Provider  Amino Acids-Protein Hydrolys (FEEDING SUPPLEMENT, PRO-STAT SUGAR FREE 64,) LIQD Take 30 mLs by mouth 2 (two) times daily. Patient not taking: Reported on 10/07/2017 9/92/42   Leighton Ruff, MD  carvedilol (COREG) 6.25 MG tablet Take 1 tablet (6.25 mg total) by mouth 2 (two) times daily with a meal. Patient not taking: Reported on 10/07/2017 6/83/41   Leighton Ruff, MD  docusate sodium (COLACE) 100 MG capsule Take 1 capsule (100 mg total) by mouth 2 (two) times daily. Patient not taking: Reported on 10/07/2017 9/62/22   Leighton Ruff, MD  feeding supplement, ENSURE  ENLIVE, (ENSURE ENLIVE) LIQD Take 237 mLs by mouth 3 (three) times daily between meals. 4/70/96   Leighton Ruff, MD  ferrous sulfate 325 (65 FE) MG tablet Take 1 tablet (325 mg total) by mouth 2 (two) times daily with a meal. Patient not taking: Reported on 10/07/2017 2/83/66   Leighton Ruff, MD  FLUoxetine (PROZAC) 20 MG tablet Take 1 tablet (20 mg total) by mouth daily. Patient not taking: Reported on 10/07/2017 2/94/76   Leighton Ruff, MD  Multiple Vitamin (MULTIVITAMIN WITH MINERALS) TABS tablet Take 1 tablet by mouth daily. 5/46/50   Leighton Ruff, MD    naproxen sodium (ANAPROX) 220 MG tablet Take 220 mg by mouth 2 (two) times daily as needed (pain).    [provider]  nicotine (NICODERM CQ - DOSED IN MG/24 HR) 7 mg/24hr patch Place 1 patch (7 mg total) onto the skin daily. Patient not taking: Reported on 10/07/2017 3/54/65   Leighton Ruff, MD  Oxycodone HCl 10 MG TABS Take 0.5 tablets (5 mg total) by mouth every 8 (eight) hours as needed. 08/01/17 08/01/18  Santos-Sanchez, Merlene Morse, MD  pantoprazole (PROTONIX) 40 MG tablet Take 1 tablet (40 mg total) by mouth daily. Patient not taking: Reported on 10/07/2017 6/81/27   Leighton Ruff, MD    Family History Family History  Problem Relation Age of Onset  . Cancer Mother 64       Originating in the abdomen, otherwise unknwon  . Cancer - Other Maternal Aunt        Throat    Social History Social History   Tobacco Use  . Smoking status: Current Every Day Smoker    Packs/day: 0.50    Years: 39.00    Pack years: 19.50    Types: Cigarettes  . Smokeless tobacco: Never Used  Substance Use Topics  . Alcohol use: Yes    Comment: 07/28/2017 "40oz beer/day; if I have it"  . Drug use: No    Comment: 07/28/2017 "nothing in 3 wks"     Allergies   Penicillins   Review of Systems Review of Systems  Skin: Positive for wound.  All other systems reviewed and are negative.    Physical Exam Updated Vital Signs BP 125/78   Pulse 77   Temp (!) 97.5 F (36.4 C) (Oral)   Resp 20   SpO2 99%   Physical Exam  Constitutional: She is oriented to person, place, and time. She appears well-developed and well-nourished. No distress.  HENT:  Head: Normocephalic and atraumatic.  Right Ear: Hearing normal.  Left Ear: Hearing normal.  Nose: Nose normal.  Mouth/Throat: Oropharynx is clear and moist and mucous membranes are normal.  Eyes: Conjunctivae and EOM are normal. Pupils are equal, round, and reactive to light.  Neck: Normal range of motion. Neck supple.  Cardiovascular: Regular  rhythm, S1 normal and S2 normal. Exam reveals no gallop and no friction rub.  No murmur heard. Pulmonary/Chest: Effort normal and breath sounds normal. No respiratory distress. She exhibits no tenderness.  Abdominal: Soft. Normal appearance and bowel sounds are normal. There is no hepatosplenomegaly. There is no tenderness. There is no rebound, no guarding, no tenderness at McBurney's point and negative Murphy's sign. No hernia.  Musculoskeletal: Normal range of motion.  Neurological: She is alert and oriented to person, place, and time. She has normal strength. No cranial nerve deficit or sensory deficit. Coordination normal. GCS eye subscore is 4. GCS verbal subscore is 5. GCS motor subscore is 6.  Skin: Skin is  warm and dry. No rash noted. No cyanosis.  Breakdown of the skin on the inferior aspect of the ostomy, no induration or skin drainage  Psychiatric: She has a normal mood and affect. Her speech is normal and behavior is normal. Thought content normal.  Nursing note and vitals reviewed.    ED Treatments / Results  Labs (all labs ordered are listed, but only abnormal results are displayed) Labs Reviewed - No data to display  EKG  EKG Interpretation None       Radiology No results found.  Procedures Procedures (including critical care time)  Medications Ordered in ED Medications - No data to display   Initial Impression / Assessment and Plan / ED Course  I have reviewed the triage vital signs and the nursing notes.  Pertinent labs & imaging results that were available during my care of the patient were reviewed by me and considered in my medical decision making (see chart for details).     Patient presents to the emergency department with complications of her colostomy.  Patient is disheveled and unkempt.  There are bed bugs crawling on her at arrival to the ER.  She reports that she lives with a roommate and has "friends" help her with her health care, but does not  have any formal visiting nurse, etc.  Her colostomy bag is ill fitting and are overflowing.  There is evidence of ostomy wound underneath the back, but no evidence of infection.  Ostomy wound care provided, will require follow-up social work and case management.  Patient at risk for infection due to the colostomy breakdown, will empirically cover with antibiotics.  Final Clinical Impressions(s) / ED Diagnoses   Final diagnoses:  Colostomy complication, unspecified [facility])    ED Discharge Orders    None       Clariece Roesler, Gwenyth Allegra, MD 11/17/17 [phone]

## 2017-11-17 NOTE — Telephone Encounter (Signed)
EDCM contacted pt regarding ostomy supplies.  Pt states she will order correct size today.

## 2017-11-17 NOTE — ED Triage Notes (Signed)
Per [facility], Pt from home. Pt had colostomy surgery done this past Friday. Pt reports having issues with bag staying in place. Pt has redness and excoriation underneath colostomy.

## 2017-11-18 ENCOUNTER — Ambulatory Visit: Payer: Self-pay | Admitting: Nurse Practitioner

## 2017-11-18 ENCOUNTER — Telehealth: Payer: Self-pay | Admitting: Nurse Practitioner

## 2017-11-18 ENCOUNTER — Other Ambulatory Visit: Payer: Self-pay

## 2017-11-18 ENCOUNTER — Telehealth: Payer: Self-pay | Admitting: *Deleted

## 2017-11-18 NOTE — Telephone Encounter (Signed)
Rescheduled appts per 1/16 sch msg - spoke with patient regarding appts.

## 2017-11-18 NOTE — Telephone Encounter (Signed)
"  Calling for patient because her transportation fell through.  Could she reschedule for later today or tomorrow?  I will bring her."    Provider notified.  Verbal order received and read back from Ned Card NP  for anything the morning of November 24, 2017.  Availability given to caller at this time.      "We'll take the 10:15 am visit on 11-24-2017."   Arrive time 9:15 am for registration for 9:45 am lab followed by 10:15 am visit.  No further needs or questions at this time.  Scheduling message sent.

## 2017-11-24 ENCOUNTER — Inpatient Hospital Stay (HOSPITAL_BASED_OUTPATIENT_CLINIC_OR_DEPARTMENT_OTHER): Payer: Medicaid Other | Admitting: Nurse Practitioner

## 2017-11-24 ENCOUNTER — Inpatient Hospital Stay: Payer: Medicaid Other | Attending: Oncology

## 2017-11-24 ENCOUNTER — Telehealth: Payer: Self-pay | Admitting: Nurse Practitioner

## 2017-11-24 ENCOUNTER — Encounter: Payer: Self-pay | Admitting: Nurse Practitioner

## 2017-11-24 VITALS — BP 160/95 | HR 100 | Temp 98.4°F | Resp 18 | Ht 62.0 in | Wt 128.0 lb

## 2017-11-24 DIAGNOSIS — C20 Malignant neoplasm of rectum: Secondary | ICD-10-CM

## 2017-11-24 DIAGNOSIS — K140 Glossitis: Secondary | ICD-10-CM | POA: Insufficient documentation

## 2017-11-24 DIAGNOSIS — Z9221 Personal history of antineoplastic chemotherapy: Secondary | ICD-10-CM

## 2017-11-24 DIAGNOSIS — Z933 Colostomy status: Secondary | ICD-10-CM | POA: Diagnosis not present

## 2017-11-24 DIAGNOSIS — D509 Iron deficiency anemia, unspecified: Secondary | ICD-10-CM

## 2017-11-24 DIAGNOSIS — F1910 Other psychoactive substance abuse, uncomplicated: Secondary | ICD-10-CM | POA: Diagnosis not present

## 2017-11-24 DIAGNOSIS — Z923 Personal history of irradiation: Secondary | ICD-10-CM | POA: Insufficient documentation

## 2017-11-24 DIAGNOSIS — K1230 Oral mucositis (ulcerative), unspecified: Secondary | ICD-10-CM

## 2017-11-24 LAB — CEA (IN HOUSE-CHCC): CEA (CHCC-In House): 3.5 ng/mL (ref 0.00–5.00)

## 2017-11-24 NOTE — Telephone Encounter (Signed)
Gave patient avs and calendar with appts per 1/22 los.  °

## 2017-11-24 NOTE — Progress Notes (Addendum)
  [facility] OFFICE PROGRESS NOTE   Diagnosis: Rectal cancer  INTERVAL HISTORY:   Ms. Besse returns for follow-up.  She reports recent evaluation in the emergency department for a colostomy "infection".  Upon further questioning she reports the skin surrounding the colostomy has been irritated.  Colostomy is functioning normally.  She reports a good appetite.  She continues to have a nonhealing perineal wound.  She notes significant drainage.  Objective:  Vital signs in last 24 hours:  Blood pressure (!) 160/95, pulse 100, temperature 98.4 F (36.9 C), temperature source Oral, resp. rate 18, height _0  (1.575 m), weight 128 lb (58.1 kg), SpO2 100 %.    HEENT: Poor dentition; multiple missing and fractured teeth.  Left low neck/scalene region with chronic firm fullness. Lymphatics: No palpable cervical, supraclavicular, axillary or inguinal lymph nodes. Resp: Lungs clear bilaterally. Cardio: Regular rate and rhythm. GI: Abdomen soft and nontender.  No hepatomegaly.  Left lower quadrant colostomy.  Large wound at the perineum; no evidence of infection; no nodularity.  Thin liquid drainage present. Vascular: No leg edema.   Lab Results:  Lab Results  Component Value Date   WBC 7.1 10/07/2017   HGB 9.7 (L) 10/07/2017   HCT 31.5 (L) 10/07/2017   MCV 74.2 (L) 10/07/2017   PLT 343 10/07/2017   NEUTROABS 3.6 10/07/2017    Imaging:  No results found.  Medications: I have reviewed the patient's current medications.  Assessment/Plan: 1. Rectal cancer-distal rectal mass noted on colonoscopy 01/21/2017 with a biopsy confirming adenocarcinoma  ? CT abdomen/pelvis 01/17/2017 and CT chest 01/19/2017 with no evidence of distant metastatic disease ? MRI 02/18/2017-T3 N0, 6.5cm from the anal verge ? Radiation/Xeloda initiated 02/17/2017; She did not receive radiation 03/04/2017 through 03/11/2017;received radiation 03/12/2017 and 03/13/2017;radiation then resumed  04/03/2017; she continued Xeloda, radiation completed 05/01/2017 ? APR 06/18/2018, ypT3,ypN0tumor. MSI-stable, no loss of mismatch repair protein expression. Negative margins. ? Adjuvant chemotherapy not recommended due to nonhealing perineal wound  2. Iron deficiency anemia secondary to #1  3. Polysubstance abuse  4. Tongue ulcer 04/21/2017-question mucositis related to Xeloda.  5.Wound Dehiscence-Admission 07/10/2017     Disposition: Ms. Arave appears stable.  She remains in clinical remission from rectal cancer.  We will follow-up on the CEA from today.  She will return for a follow-up visit in approximately 5 months.  We made a referral to Dr. Marcello Moores to evaluate the persistent perineal wound and skin irritation surrounding the colostomy.  Patient seen with Dr. Benay Spice.  25 minutes were spent face-to-face at today's visit with the majority of that time involved in counseling/coordination of care.    Ned Card ANP/GNP-BC   11/24/2017  10:47 AM  This was a shared visit with Ned Card.  Ms. Percle was interviewed and examined.  She is in remission from rectal cancer.  The perineal wound remains open with drainage.  We will refer her to Dr. Marcello Moores to evaluate the wound.  Julieanne Manson, MD

## 2017-12-03 ENCOUNTER — Telehealth: Payer: Self-pay

## 2017-12-03 NOTE — Telephone Encounter (Signed)
Received call regarding pt. In call, person states that pt is waiting for "a referral to a doctor about her stoma". Also states "she said its probably infected". Attempted to return call to patient with no answer. LVM for pt to return call to clinic. Will try again.

## 2017-12-03 NOTE — Telephone Encounter (Signed)
Spoke with pt. She also expressed that she is "bleeding from my rectum". Pt states "its pink tinged when I wipe". She also noted "throbbing pain in rectum and leaking from my rectal wound". Per Ned Card, NP, pt to consult Dr. Leighton Ruff. Pt informed that a referral was placed to Dr. Marcello Moores at last visit and information for her office was provided. Pt voiced understanding and stated she would "call as soon as I get off with you". This RN voiced understanding. Pt knows to call back with any further concerns.

## 2017-12-08 ENCOUNTER — Ambulatory Visit: Payer: Self-pay | Admitting: Oncology

## 2017-12-08 ENCOUNTER — Other Ambulatory Visit: Payer: Self-pay

## 2017-12-18 ENCOUNTER — Ambulatory Visit (HOSPITAL_COMMUNITY)
Admission: EM | Admit: 2017-12-18 | Discharge: 2017-12-18 | Disposition: A | Discharge status: Home / Self Care | Payer: Medicaid Other | Attending: Family Medicine | Admitting: Family Medicine

## 2017-12-18 ENCOUNTER — Encounter (HOSPITAL_COMMUNITY): Payer: Self-pay | Admitting: Family Medicine

## 2017-12-18 DIAGNOSIS — I10 Essential (primary) hypertension: Secondary | ICD-10-CM | POA: Insufficient documentation

## 2017-12-18 DIAGNOSIS — F17210 Nicotine dependence, cigarettes, uncomplicated: Secondary | ICD-10-CM | POA: Diagnosis not present

## 2017-12-18 DIAGNOSIS — R300 Dysuria: Secondary | ICD-10-CM | POA: Diagnosis present

## 2017-12-18 DIAGNOSIS — Z880 Allergy status to penicillin: Secondary | ICD-10-CM | POA: Insufficient documentation

## 2017-12-18 DIAGNOSIS — N390 Urinary tract infection, site not specified: Secondary | ICD-10-CM | POA: Diagnosis not present

## 2017-12-18 DIAGNOSIS — F319 Bipolar disorder, unspecified: Secondary | ICD-10-CM | POA: Insufficient documentation

## 2017-12-18 LAB — POCT URINALYSIS DIP (DEVICE)
Bilirubin Urine: NEGATIVE
GLUCOSE, UA: NEGATIVE mg/dL
Ketones, ur: NEGATIVE mg/dL
Nitrite: NEGATIVE
Protein, ur: NEGATIVE mg/dL
SPECIFIC GRAVITY, URINE: 1.01 (ref 1.005–1.030)
UROBILINOGEN UA: 0.2 mg/dL (ref 0.0–1.0)
pH: 5.5 (ref 5.0–8.0)

## 2017-12-18 MED ORDER — CIPROFLOXACIN HCL 500 MG PO TABS: 500.0000 mg | ORAL_TABLET | Freq: Two times a day (BID) | ORAL | 0 refills | Status: DC

## 2017-12-18 MED ORDER — FLUCONAZOLE 150 MG PO TABS: 150.0000 mg | ORAL_TABLET | Freq: Once | ORAL | 0 refills | Status: AC

## 2017-12-18 NOTE — Discharge Instructions (Addendum)
Follow up with Dr. Marcello Moores regarding the perineal surgical wound.  You appear to have a urinary tract infection.  Sometimes this is complicated by a yeast infection as well.  I have prescribed both an antibiotic and an antiyeast medication to help you get well fast.  You may want to get some counseling to help you deal with the aftermath of the cancer surgery.  This can really make a big difference in the quality of your life.  Ask your primary care doctor about this.

## 2017-12-18 NOTE — ED Provider Notes (Signed)
[place]   [id number] 12/18/17 Arrival Time: 1257   SUBJECTIVE:  Sherry Baker is a 52 y.o. female who presents to the urgent care with complaint of dysuria x 3 days. Reports recent rectal surgery and she has a lot of drainage from wound. Unsure about vaginal discharge.   Patient says that she saw her surgeon on Tuesday and there was no sign of infection. Surgeon spent just a few minutes with her and told her she would need a colostomy and an open wound would be present for the rest of her life. There was no time for discussion and patient says that her feelings were hurt. She cried throughout the interview here in the office. Past Medical History:  Diagnosis Date  . Alcohol abuse   . Allergy    PCNS swelling  . Arthritis   . Bipolar 1 disorder ([place])   . Cancer ([place]) 01/21/2017   rectal cancer  . Cirrhosis of liver ([place])   . Depression   . Genetic testing 03/24/2017   Ms. Peet underwent genetic counseling and testing for hereditary cancer syndromes on 02/17/2017. Her results were negative for mutations in all 46 genes analyzed by [facility]'s 46-gene Common Hereditary Cancers Panel. Genes analyzed include: APC, ATM, AXIN2, BARD1, BMPR1A, BRCA1, BRCA2, BRIP1, CDH1, CDKN2A, CHEK2, CTNNA1, DICER1, EPCAM, GREM1, HOXB13, KIT, MEN1, MLH1, MSH2, MSH3, MSH6, MUTYH, NBN,  . Hypertension    Family History  Problem Relation Age of Onset  . Cancer Mother 58       Originating in the abdomen, otherwise unknwon  . Cancer - Other Maternal Aunt        Throat   Social History   Socioeconomic History  . Marital status: Single    Spouse name: Not on file  . Number of children: Not on file  . Years of education: Not on file  . Highest education level: Not on file  Social Needs  . Financial resource strain: Not on file  . Food insecurity - worry: Not on file  . Food insecurity - inability: Not on file  . Transportation needs - medical: Not on file  . Transportation needs -  non-medical: Not on file  Occupational History  . Not on file  Tobacco Use  . Smoking status: Current Every Day Smoker    Packs/day: 0.50    Years: 39.00    Pack years: 19.50    Types: Cigarettes  . Smokeless tobacco: Never Used  Substance and Sexual Activity  . Alcohol use: Yes    Comment: 07/28/2017 "40oz beer/day; if I have it"  . Drug use: No    Comment: 07/28/2017 "nothing in 3 wks"  . Sexual activity: Not Currently    Birth control/protection: Abstinence  Other Topics Concern  . Not on file  Social History Narrative  . Not on file   No outpatient medications have been marked as taking for the 12/18/17 encounter [facility] Encounter).   Allergies  Allergen Reactions  . Penicillins Hives and Swelling    Has patient had a PCN reaction causing immediate rash, facial/tongue/throat swelling, SOB or lightheadedness with hypotension: Yes Has patient had a PCN reaction causing severe rash involving mucus membranes or skin necrosis: No Has patient had a PCN reaction that required hospitalization No Has patient had a PCN reaction occurring within the last 10 years: Yes If all of the above answers are "NO", then may proceed with Cephalosporin use.       ROS: As per HPI, remainder of ROS  negative.   OBJECTIVE:   Vitals:   12/18/17 1418  BP: (!) 167/109  Pulse: (!) 104  Resp: 18  Temp: 97.9 F (36.6 C)  SpO2: 100%     General appearance: alert;  distressed Eyes: PERRL; EOMI; conjunctiva normal HENT: normocephalic; atraumatic; TMs normal, canal normal,  oral mucosa normal Neck: supple Abdomen: soft, tender suprapubic area with colostomy in left lower quadrant Back: no CVA tenderness Extremities: no cyanosis or edema; symmetrical with no gross deformities Skin: warm and dry Neurologic: normal gait; grossly normal Psychological: alert and cooperative; crying    Labs:  Results for orders placed or performed in visit on 11/24/17  CEA (IN HOUSE-[facility])  Result Value  Ref Range   CEA ([facility]-In House) 3.50 0.00 - 5.00 ng/mL    Labs Reviewed  URINE CULTURE    No results found.     ASSESSMENT & PLAN:  1. Lower urinary tract infectious disease     Meds ordered this encounter  Medications  . ciprofloxacin (CIPRO) 500 MG tablet    Sig: Take 1 tablet (500 mg total) by mouth 2 (two) times daily.    Dispense:  10 tablet    Refill:  0  . fluconazole (DIFLUCAN) 150 MG tablet    Sig: Take 1 tablet (150 mg total) by mouth once for 1 dose. Repeat if needed    Dispense:  2 tablet    Refill:  0    Reviewed expectations re: course of current medical issues. Questions answered. Outlined signs and symptoms indicating need for more acute intervention. Patient verbalized understanding. After Visit Summary given.    Procedures:      Robyn Haber, MD 12/18/17 1437

## 2017-12-18 NOTE — ED Triage Notes (Signed)
Pt here dysuria x 3 days. Reports recent rectal surgery and she has a lot of drainage from wound. Unsure about vaginal discharge.

## 2017-12-19 LAB — URINE CULTURE: Culture: <10000 — AB

## 2017-12-23 ENCOUNTER — Ambulatory Visit (HOSPITAL_COMMUNITY)
Admission: EM | Admit: 2017-12-23 | Discharge: 2017-12-23 | Disposition: A | Discharge status: Home / Self Care | Payer: Medicaid Other | Attending: Family Medicine | Admitting: Family Medicine

## 2017-12-23 ENCOUNTER — Encounter (HOSPITAL_COMMUNITY): Payer: Self-pay | Admitting: Emergency Medicine

## 2017-12-23 ENCOUNTER — Other Ambulatory Visit: Payer: Self-pay

## 2017-12-23 DIAGNOSIS — Z933 Colostomy status: Secondary | ICD-10-CM | POA: Diagnosis not present

## 2017-12-23 DIAGNOSIS — F17210 Nicotine dependence, cigarettes, uncomplicated: Secondary | ICD-10-CM | POA: Diagnosis not present

## 2017-12-23 DIAGNOSIS — I10 Essential (primary) hypertension: Secondary | ICD-10-CM | POA: Insufficient documentation

## 2017-12-23 DIAGNOSIS — R1030 Lower abdominal pain, unspecified: Secondary | ICD-10-CM | POA: Diagnosis not present

## 2017-12-23 DIAGNOSIS — C20 Malignant neoplasm of rectum: Secondary | ICD-10-CM | POA: Insufficient documentation

## 2017-12-23 DIAGNOSIS — N898 Other specified noninflammatory disorders of vagina: Secondary | ICD-10-CM | POA: Diagnosis not present

## 2017-12-23 DIAGNOSIS — K7460 Unspecified cirrhosis of liver: Secondary | ICD-10-CM | POA: Diagnosis not present

## 2017-12-23 DIAGNOSIS — K219 Gastro-esophageal reflux disease without esophagitis: Secondary | ICD-10-CM | POA: Insufficient documentation

## 2017-12-23 DIAGNOSIS — F319 Bipolar disorder, unspecified: Secondary | ICD-10-CM | POA: Insufficient documentation

## 2017-12-23 DIAGNOSIS — Z79899 Other long term (current) drug therapy: Secondary | ICD-10-CM | POA: Insufficient documentation

## 2017-12-23 DIAGNOSIS — Z880 Allergy status to penicillin: Secondary | ICD-10-CM | POA: Insufficient documentation

## 2017-12-23 DIAGNOSIS — Z90710 Acquired absence of both cervix and uterus: Secondary | ICD-10-CM | POA: Insufficient documentation

## 2017-12-23 DIAGNOSIS — R102 Pelvic and perineal pain: Secondary | ICD-10-CM | POA: Diagnosis present

## 2017-12-23 LAB — POCT URINALYSIS DIP (DEVICE)
BILIRUBIN URINE: NEGATIVE
Glucose, UA: NEGATIVE mg/dL
HGB URINE DIPSTICK: NEGATIVE
KETONES UR: NEGATIVE mg/dL
Leukocytes, UA: NEGATIVE
NITRITE: NEGATIVE
PH: 5.5 (ref 5.0–8.0)
PROTEIN: NEGATIVE mg/dL
Specific Gravity, Urine: 1.01 (ref 1.005–1.030)
Urobilinogen, UA: 0.2 mg/dL (ref 0.0–1.0)

## 2017-12-23 MED ORDER — HYDROCODONE-ACETAMINOPHEN 5-325 MG PO TABS: 1.0000 | ORAL_TABLET | Freq: Four times a day (QID) | ORAL | 0 refills | Status: AC | PRN

## 2017-12-23 MED ORDER — ONDANSETRON 4 MG PO TBDP: 4.0000 mg | ORAL_TABLET | Freq: Three times a day (TID) | ORAL | 0 refills | Status: DC | PRN

## 2017-12-23 NOTE — ED Triage Notes (Signed)
Pt was here five days ago for stomach pain, dysuria, and pain in her vaginal area. States she was given cipro but it did not help. Tried to make a referral with paperwork when she was here last and wasn't taking any new patients. Pt continues to have ongoing pain in her vagina and generalized stomach.

## 2017-12-23 NOTE — ED Provider Notes (Signed)
[place]    CSN: [id number] Arrival date & time: 12/23/17  1209     History   Chief Complaint Chief Complaint  Patient presents with  . Abdominal Pain  . Vaginal Pain    HPI Sherry Baker is a 52 y.o. female history of hypertension, alcohol abuse, cirrhosis of liver, bipolar disorder, rectal cancer presenting today with concern about rectal drainage/vaginal irritation, abdominal pain.  Patient had surgery in August 2018 for removal of a rectal mass, she was seen by her surgeon February 12 and was advised that the wound was healing well and noninfected.  She is concerned today because she has had continued yellow drainage from the rectal area.  She is concerned that this has gone into the vagina and is irritating that.  She has had a burning and itching sensation more related to the vagina versus urination.  She was seen here on February 15 and treated for UTI with Cipro and Diflucan.  States her symptoms improved some but not much.  She is also having abdominal pain in her bilateral lower abdomen that worsens as it gets closer to the stoma/colostomy bag.  Pain is there all the time.  She has had normal output from the back.  Eating and drinking like normal although she does have some nausea and occasional vomiting.  She has had this at baseline for a while now.  She is not taking anything for nausea.  Patient has had a hysterectomy.  HPI  Past Medical History:  Diagnosis Date  . Alcohol abuse   . Allergy    PCNS swelling  . Arthritis   . Bipolar 1 disorder ([place])   . Cancer ([place]) 01/21/2017   rectal cancer  . Cirrhosis of liver ([place])   . Depression   . Genetic testing 03/24/2017   [name] underwent genetic counseling and testing for hereditary cancer syndromes on 02/17/2017. Her results were negative for mutations in all 46 genes analyzed by [facility]'s 46-gene Common Hereditary Cancers Panel. Genes analyzed include: APC, ATM, AXIN2, BARD1, BMPR1A, BRCA1, BRCA2, BRIP1,  CDH1, CDKN2A, CHEK2, CTNNA1, DICER1, EPCAM, GREM1, HOXB13, KIT, MEN1, MLH1, MSH2, MSH3, MSH6, MUTYH, NBN,  . Hypertension     Patient Active Problem List   Diagnosis Date Noted  . Wound dehiscence 07/10/2017  . Genetic testing 03/24/2017  . Hypertension 02/18/2017  . GERD (gastroesophageal reflux disease) 02/18/2017  . Rectal cancer ([place]) 01/27/2017  . Abdominal pain, epigastric   . Rectal mass   . Fever   . Anemia 01/16/2017  . Abdominal pain   . Constipation   . Alcohol abuse   . Tobacco abuse   . Cocaine abuse ([place])   . Bipolar affective disorder ([place])   . Protein-calorie malnutrition, severe ([place]) 02/13/2014  . Suicide ideation 02/12/2014  . Suicidal ideation 02/11/2014  . Alcohol intoxication ([employer]) 02/11/2014  . Transaminitis 02/11/2014  . Substance abuse ([place]) 02/11/2014  . Depression 02/11/2014  . Thrombocytopenia ([place]) 02/11/2014    Past Surgical History:  Procedure Laterality Date  . ABDOMINAL PERINEAL BOWEL RESECTION N/A 06/18/2017   Procedure: ABDOMINAL PERINEAL RESECTION ERAS PATHWAY;  Surgeon: Leighton Ruff, MD;  Location: [facility];  Service: General;  Laterality: N/A;  . COLONOSCOPY WITH PROPOFOL Left 01/21/2017   Procedure: COLONOSCOPY WITH PROPOFOL;  Surgeon: Otis Brace, MD;  Location: [facility];  Service: Gastroenterology;  Laterality: Left;  . FRACTURE SURGERY    . MANDIBLE FRACTURE SURGERY      OB History    No data  available       Home Medications    Prior to Admission medications   Medication Sig Start Date End Date Taking? Authorizing Provider  ciprofloxacin (CIPRO) 500 MG tablet Take 1 tablet (500 mg total) by mouth 2 (two) times daily. 12/18/17   Robyn Haber, MD  feeding supplement, ENSURE ENLIVE, (ENSURE ENLIVE) LIQD Take 237 mLs by mouth 3 (three) times daily between meals. 0/34/03   Leighton Ruff, MD  HYDROcodone-acetaminophen (NORCO/VICODIN) 5-325 MG tablet Take 1-2 tablets by mouth every 6 (six) hours as needed for up to 3  days for severe pain. 12/23/17 12/26/17  Tydarius Yawn C, PA-C  Multiple Vitamin (MULTIVITAMIN WITH MINERALS) TABS tablet Take 1 tablet by mouth daily. 03/26/80   Leighton Ruff, MD  naproxen sodium (ANAPROX) 220 MG tablet Take 220 mg by mouth 2 (two) times daily as needed (pain).    [provider]  ondansetron (ZOFRAN ODT) 4 MG disintegrating tablet Take 1 tablet (4 mg total) by mouth every 8 (eight) hours as needed for nausea or vomiting. 12/23/17   Danicka Hourihan C, PA-C  Oxycodone HCl 10 MG TABS Take 0.5 tablets (5 mg total) by mouth every 8 (eight) hours as needed. 08/01/17 08/01/18  Welford Roche, MD  ziprasidone (GEODON) 40 MG capsule Take 1 capsule (40 mg total) by mouth 2 (two) times daily with a meal. 11/06/11 01/09/12  Daleen Bo, MD    Family History Family History  Problem Relation Age of Onset  . Cancer Mother 57       Originating in the abdomen, otherwise unknwon  . Cancer - Other Maternal Aunt        Throat    Social History Social History   Tobacco Use  . Smoking status: Current Every Day Smoker    Packs/day: 0.50    Years: 39.00    Pack years: 19.50    Types: Cigarettes  . Smokeless tobacco: Never Used  Substance Use Topics  . Alcohol use: Yes    Comment: 07/28/2017 "40oz beer/day; if I have it"  . Drug use: No    Comment: 07/28/2017 "nothing in 3 wks"     Allergies   Penicillins   Review of Systems Review of Systems  Constitutional: Negative for fatigue and fever.  Eyes: Negative for visual disturbance.  Respiratory: Negative for shortness of breath.   Cardiovascular: Negative for chest pain.  Gastrointestinal: Positive for abdominal pain, nausea and vomiting.  Genitourinary: Negative for vaginal discharge.       Rectal drainage, vaginal itching, burning  Musculoskeletal: Positive for myalgias.  Skin: Negative for wound.     Physical Exam Triage Vital Signs ED Triage Vitals  Enc Vitals Group     BP 12/23/17 1301 (!) 190/106      Pulse Rate 12/23/17 1301 (!) 103     Resp 12/23/17 1301 18     Temp 12/23/17 1301 97.7 F (36.5 C)     Temp src --      SpO2 12/23/17 1301 100 %     Weight --      Height --      Head Circumference --      Peak Flow --      Pain Score 12/23/17 1304 8     Pain Loc --      Pain Edu? --      Excl. in [place]? --    No data found.  Updated Vital Signs BP (!) 190/106   Pulse (!) 103   Temp  97.7 F (36.5 C)   Resp 18   SpO2 100%   Visual Acuity Right Eye Distance:   Left Eye Distance:   Bilateral Distance:    Right Eye Near:   Left Eye Near:    Bilateral Near:     Physical Exam  Constitutional: She appears well-developed and well-nourished. No distress.  HENT:  Head: Normocephalic and atraumatic.  Eyes: Conjunctivae are normal.  Neck: Neck supple.  Cardiovascular: Normal rate and regular rhythm.  No murmur heard. Pulmonary/Chest: Effort normal and breath sounds normal. No respiratory distress.  Abdominal: Soft. There is no tenderness.  Patient has tenderness across lower abdomen increases as you get closer to her stoma which is located on her left lower quadrant, colostomy bag in place, negative rebound  Genitourinary:  Genitourinary Comments: Vaginal vault appears dry, minimal discharge noted, cervix not observed.  Patient has small amount blood near vaginal opening, began bleeding while using a larger speculum, smaller speculum allowed to easier exam  Patient has large 4-5 cm opening where her rectum would be, visualization of what most likely would be the posterior vaginal wall.  No overt drainage or erythema concerning for infection.  Musculoskeletal: She exhibits no edema.  Neurological: She is alert.  Skin: Skin is warm and dry.  Psychiatric: She has a normal mood and affect.  Nursing note and vitals reviewed.    UC Treatments / Results  Labs (all labs ordered are listed, but only abnormal results are displayed) Labs Reviewed  URINE CULTURE  POCT URINALYSIS  DIP (DEVICE)  CERVICOVAGINAL ANCILLARY ONLY    EKG  EKG Interpretation None       Radiology No results found.  Procedures Procedures (including critical care time)  Medications Ordered in UC Medications - No data to display   Initial Impression / Assessment and Plan / UC Course  I have reviewed the triage vital signs and the nursing notes.  Pertinent labs & imaging results that were available during my care of the patient were reviewed by me and considered in my medical decision making (see chart for details).     Patient with concern about drainage from rectum, wound appears to be healed, but is so large that she may have continued drainage from the opened area, discussed getting set up with PCP for referral to plastics as this wound most likely will not close on its own.  Discussed ways to help with collecting drainage to prevent going into vaginal area.  Vaginal irritation appears to be more related to dryness versus yeast or infection.  Vaginal swab obtained.  Advised to try coconut oil.  Also advised to follow-up with PCP to discuss appropriateness of initiating a hormonal therapy to help with irritation.    Abdominal pain does not appear acute, It appears chronic since the surgery and possibly related to the stoma, she has had no increase in pain of recently.  She states in the past oxycodone has helped her.  Advised that this is not a long-term solution, but I will provide a few for severe pain.  Advised her to continue Aleve or other over-the-counter anti-inflammatories for mild to moderate pain.  We will also provide Zofran to use as needed as she frequently has nausea and occasionally vomiting.  Patient appears to have had some negative experiences with health care in the past, she is emotional about this and also emotional about her current situation since the rectal surgery.  Time was spent trying to encourage and reassure her.  Discussed  strict return precautions.  Patient verbalized understanding and is agreeable with plan.   Final Clinical Impressions(s) / UC Diagnoses   Final diagnoses:  Lower abdominal pain  Vaginal irritation    ED Discharge Orders        Ordered    HYDROcodone-acetaminophen (NORCO/VICODIN) 5-325 MG tablet  Every 6 hours PRN     12/23/17 1456    ondansetron (ZOFRAN ODT) 4 MG disintegrating tablet  Every 8 hours PRN     12/23/17 1500       Controlled Substance Prescriptions [place] Controlled Substance Registry consulted? Yes, I have consulted the [place] Controlled Substances Registry for this patient, and feel the risk/benefit ratio today is favorable for proceeding with this prescription for a controlled substance.   Joneen Caraway Wolsey C, [place] 12/24/17 [phone]

## 2017-12-23 NOTE — Discharge Instructions (Signed)
Rectal area does not appear concerning for infection, based off the size of the wound, she will likely have continued drainage.   We are checking vagina for any causes of irritation but it may be related to dryness. Please try dallup of coconut oil in vagina daily. Please follow up with primary care about appropriateness of further hormonal measures to help with this.   I have provided a few Norco to help with pain- use this only for severe pain. Please use Aleeve or Ibuprofen for mild-moderate pain.   I have also sent in zofran to help with nausea and vomiting.

## 2017-12-24 LAB — CERVICOVAGINAL ANCILLARY ONLY
Bacterial vaginitis: NEGATIVE
CANDIDA VAGINITIS: NEGATIVE
CHLAMYDIA, DNA PROBE: NEGATIVE
NEISSERIA GONORRHEA: NEGATIVE
Trichomonas: NEGATIVE

## 2017-12-24 LAB — URINE CULTURE: CULTURE: NO GROWTH

## 2018-04-29 ENCOUNTER — Other Ambulatory Visit: Payer: Self-pay | Admitting: *Deleted

## 2018-04-29 ENCOUNTER — Encounter: Payer: Self-pay | Admitting: Oncology

## 2018-04-29 ENCOUNTER — Telehealth: Payer: Self-pay | Admitting: Oncology

## 2018-04-29 ENCOUNTER — Inpatient Hospital Stay: Payer: Medicaid Other

## 2018-04-29 ENCOUNTER — Inpatient Hospital Stay: Payer: Medicaid Other | Attending: Oncology | Admitting: Oncology

## 2018-04-29 VITALS — BP 108/69 | HR 115 | Temp 97.9°F | Resp 18 | Ht 62.0 in | Wt 125.9 lb

## 2018-04-29 DIAGNOSIS — Z9221 Personal history of antineoplastic chemotherapy: Secondary | ICD-10-CM | POA: Diagnosis not present

## 2018-04-29 DIAGNOSIS — F1910 Other psychoactive substance abuse, uncomplicated: Secondary | ICD-10-CM | POA: Diagnosis not present

## 2018-04-29 DIAGNOSIS — Z923 Personal history of irradiation: Secondary | ICD-10-CM | POA: Insufficient documentation

## 2018-04-29 DIAGNOSIS — C20 Malignant neoplasm of rectum: Secondary | ICD-10-CM

## 2018-04-29 DIAGNOSIS — G893 Neoplasm related pain (acute) (chronic): Secondary | ICD-10-CM | POA: Diagnosis not present

## 2018-04-29 DIAGNOSIS — D509 Iron deficiency anemia, unspecified: Secondary | ICD-10-CM | POA: Diagnosis not present

## 2018-04-29 DIAGNOSIS — K6289 Other specified diseases of anus and rectum: Secondary | ICD-10-CM

## 2018-04-29 LAB — CEA (IN HOUSE-CHCC): CEA (CHCC-In House): 4.86 ng/mL (ref 0.00–5.00)

## 2018-04-29 NOTE — Progress Notes (Signed)
  [facility] OFFICE PROGRESS NOTE   Diagnosis: Rectal cancer  INTERVAL HISTORY:   Sherry Baker returns for a scheduled visit.  The perineal wound remains open.  She continues to have pain, but she is no longer taking narcotics.  Naproxen has not relieve the pain.  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 She continues smoking. Objective:  Vital signs in last 24 hours:  Blood pressure 108/69, pulse (!) 115, temperature 97.9 F (36.6 C), temperature source Oral, resp. rate 18, height _0  (1.575 m), weight 125 lb 14.4 oz (57.1 kg), SpO2 98 %.    HEENT: Neck without mass, oral cavity without visible mass Lymphatics: Pea-sized right posterior cervical node, no other cervical, supraclavicular, axillary, or inguinal nodes Resp: Lungs clear bilaterally Cardio: Regular rate and rhythm GI: No hepatomegaly, no mass, left lower quadrant colostomy.  The perineal wound is open to the pelvic floor.  There is a 0.5 cm slightly raised nodular area at the middle left aspect of the wound margin Vascular: No leg edema    Portacath/PICC-without erythema  Lab Results:  Lab Results  Component Value Date   WBC 7.1 10/07/2017   HGB 9.7 (L) 10/07/2017   HCT 31.5 (L) 10/07/2017   MCV 74.2 (L) 10/07/2017   PLT 343 10/07/2017   NEUTROABS 3.6 10/07/2017    CMP  Lab Results  Component Value Date   NA 131 (L) 08/29/2017   K 4.0 08/29/2017   CL 97 (L) 08/29/2017   CO2 20 (L) 08/29/2017   GLUCOSE 91 08/29/2017   BUN 6 08/29/2017   CREATININE 0.38 (L) 08/29/2017   CALCIUM 8.5 (L) 08/29/2017   PROT 7.9 08/29/2017   ALBUMIN 2.9 (L) 08/29/2017   AST 17 08/29/2017   ALT 8 (L) 08/29/2017   ALKPHOS 78 08/29/2017   BILITOT 0.4 08/29/2017   GFRNONAA >60 08/29/2017   GFRAA >60 08/29/2017    Lab Results  Component Value Date   CEA1 4.86 04/29/2018     Medications: I have reviewed the patient's current medications.   Assessment/Plan: 1. Rectal cancer-distal rectal mass noted on  colonoscopy 01/21/2017 with a biopsy confirming adenocarcinoma  ? CT abdomen/pelvis 01/17/2017 and CT chest 01/19/2017 with no evidence of distant metastatic disease ? MRI 02/18/2017-T3 N0, 6.5cm from the anal verge ? Radiation/Xeloda initiated 02/17/2017; She did not receive radiation 03/04/2017 through 03/11/2017;received radiation 03/12/2017 and 03/13/2017;radiation then resumed 04/03/2017; she continued Xeloda, radiation completed 05/01/2017 ? APR 06/18/2018, ypT3,ypN0tumor. MSI-stable, no loss of mismatch repair protein expression. Negative margins. ? Adjuvant chemotherapy not recommended due to nonhealing perineal wound  2. Iron deficiency anemia secondary to #1  3. Polysubstance abuse  4. History of tongue ulcer 04/21/2017-question mucositis related to Xeloda.  5.Wound Dehiscence-Admission 07/10/2017, perineal wound remains open     Disposition: Ms. Hilgert is in clinical remission from rectal cancer.  The perineal wound remains open.  The nodular area at the left side of the wound is likely granulation tissue.  She will return for repeat examination of this area in 2 months.  We will refer her to plastic surgery to consider treatment options for closing the perineal wound.  We will make referral to pelvic physical therapy to address the vaginal stenosis.  She will try Tylenol and ibuprofen as needed for pain.  15 minutes were spent with the patient today.  The majority of the time was used for counseling and coordination of care.  Betsy Coder, MD  04/29/2018  12:40 PM

## 2018-04-29 NOTE — Telephone Encounter (Signed)
Scheduled appt per 6/27 los - gave patient aVS and calender per los.  

## 2018-04-30 ENCOUNTER — Other Ambulatory Visit: Payer: Self-pay | Admitting: *Deleted

## 2018-04-30 DIAGNOSIS — C20 Malignant neoplasm of rectum: Secondary | ICD-10-CM

## 2018-04-30 DIAGNOSIS — T8130XA Disruption of wound, unspecified, initial encounter: Secondary | ICD-10-CM

## 2018-05-18 ENCOUNTER — Ambulatory Visit: Payer: Medicaid Other | Admitting: Physical Therapy

## 2018-05-24 ENCOUNTER — Encounter: Payer: Self-pay | Admitting: Physical Therapy

## 2018-05-24 ENCOUNTER — Other Ambulatory Visit: Payer: Self-pay

## 2018-05-24 ENCOUNTER — Ambulatory Visit: Payer: Medicaid Other | Attending: Oncology | Admitting: Physical Therapy

## 2018-05-24 DIAGNOSIS — M25652 Stiffness of left hip, not elsewhere classified: Secondary | ICD-10-CM

## 2018-05-24 DIAGNOSIS — M25651 Stiffness of right hip, not elsewhere classified: Secondary | ICD-10-CM | POA: Diagnosis not present

## 2018-05-24 DIAGNOSIS — R279 Unspecified lack of coordination: Secondary | ICD-10-CM

## 2018-05-24 DIAGNOSIS — M6281 Muscle weakness (generalized): Secondary | ICD-10-CM | POA: Diagnosis not present

## 2018-05-24 DIAGNOSIS — M62838 Other muscle spasm: Secondary | ICD-10-CM

## 2018-05-24 NOTE — Therapy (Signed)
[facility] [address], [place], [place], [address] Phone: [phone]   Fax:  [phone]  Physical Therapy Evaluation  Patient Details  Name: Sherry Baker MRN: [id number] Date of Birth: 01-08-1966 Referring Provider: Ladell Pier, MD   Encounter Date: 05/24/2018  PT End of Session - 05/24/18 1725    Visit Number  1    Date for PT Re-Evaluation  09/13/18    PT Start Time  1150    PT Stop Time  1230    PT Time Calculation (min)  40 min    Activity Tolerance  Patient limited by pain    Behavior During Therapy  [facility] for tasks assessed/performed       Past Medical History:  Diagnosis Date  . Alcohol abuse   . Allergy    PCNS swelling  . Arthritis   . Bipolar 1 disorder ([place])   . Cancer ([place]) 01/21/2017   rectal cancer  . Cirrhosis of liver ([place])   . Depression   . Genetic testing 03/24/2017   Ms. Hennen underwent genetic counseling and testing for hereditary cancer syndromes on 02/17/2017. Her results were negative for mutations in all 46 genes analyzed by [employer]'s 46-gene Common Hereditary Cancers Panel. Genes analyzed include: APC, ATM, AXIN2, BARD1, BMPR1A, BRCA1, BRCA2, BRIP1, CDH1, CDKN2A, CHEK2, CTNNA1, DICER1, EPCAM, GREM1, HOXB13, KIT, MEN1, MLH1, MSH2, MSH3, MSH6, MUTYH, NBN,  . Hypertension     Past Surgical History:  Procedure Laterality Date  . ABDOMINAL PERINEAL BOWEL RESECTION N/A 06/18/2017   Procedure: ABDOMINAL PERINEAL RESECTION ERAS PATHWAY;  Surgeon: Leighton Ruff, MD;  Location: [facility];  Service: General;  Laterality: N/A;  . COLONOSCOPY WITH PROPOFOL Left 01/21/2017   Procedure: COLONOSCOPY WITH PROPOFOL;  Surgeon: Otis Brace, MD;  Location: [place];  Service: Gastroenterology;  Laterality: Left;  . FRACTURE SURGERY    . MANDIBLE FRACTURE SURGERY      There were no vitals filed for this visit.   Subjective Assessment - 05/24/18 1155    Subjective  Pt states she has a  chronic leakage from the rectal surgery and will be getting bilateral hip replacement.  Sitting is painful and has abdominal pain where the ileostomy bag is placed.  Left leg hurts the most.  Patient states her main goal is return to be able to have intercourse.  States she hasn't tried for a couple of years due to last time it cause bleeding and extreme pain.    Patient is accompained by:  -- friend "Kendall"    Limitations  Sitting;Standing;Other (comment) intercourse    Patient Stated Goals  be able to have intercourse with partner    Currently in Pain?  No/denies         [facility] PT Assessment - 05/24/18 0001      Assessment   Medical Diagnosis  K62.9 (ICD-10-CM) - Rectal mass    Referring Provider  Ladell Pier, MD    Onset Date/Surgical Date  -- 2 years ago, cancer treatment    Prior Therapy  No      Precautions   Precautions  None      Restrictions   Weight Bearing Restrictions  No      Balance Screen   Has the patient fallen in the past 6 months  Yes    How many times?  2x/month stumble legs give out sometimes      Transport planner  Private residence    Living  Arrangements  Non-relatives/Friends      Prior Function   Level of Independence  Independent      Cognition   Overall Cognitive Status  Within Functional Limits for tasks assessed      Posture/Postural Control   Posture/Postural Control  Postural limitations    Postural Limitations  Rounded Shoulders;Flexed trunk;Increased thoracic kyphosis      ROM / Strength   AROM / PROM / Strength  PROM;Strength      PROM   PROM Assessment Site  Hip    Right/Left Hip  Right;Left    Right Hip External Rotation   10    Right Hip Internal Rotation   5    Left Hip Extension  -5    Left Hip Flexion  70    Left Hip ABduction  15      Strength   Overall Strength Comments  bilateral hip 4/5 in available range      Right Hip   Right Hip Extension  -5    Right Hip Flexion  70    Right Hip  ABduction  15      Flexibility   Soft Tissue Assessment /Muscle Length  yes    Hamstrings  50%      Palpation   Palpation comment  tight and tender adductor muscles      Ambulation/Gait   Gait Pattern  Decreased stride length;Trunk flexed                Objective measurements completed on examination: See above findings.    Pelvic Floor Special Questions - 05/24/18 0001    Prior Pelvic/Prostate Exam  Yes    Are you Pregnant or attempting pregnancy?  No    Currently Sexually Active  Yes    Is this Painful  Yes    Marinoff Scale  pain prevents any attempts at intercourse    Urinary Leakage  Yes    How often  every night - not during the day    Activities that cause leaking  With strong urge    Urinary urgency  Yes    Urinary frequency  nocturia    Skin Integrity  Erthema    Perineal Body/Introitus   Elevated    External Palpation  tender ischiocavernosis    Prolapse  Anterior Wall    Pelvic Floor Internal Exam  pt informed and consent given to perform internal soft tissue assessment    Exam Type  Vaginal    Palpation  stenosis of transverse peroneus, scar on left at TP attachment site, anterior wall collapse     Strength  weak squeeze, no lift    Strength # of reps  1    Strength # of seconds  1    Tone  high, muscle stiffness       [facility] Adult PT Treatment/Exercise - 05/24/18 0001      Self-Care   Self-Care  Other Self-Care Comments    Other Self-Care Comments   edu on dilators and moisturizers             PT Education - 05/24/18 1347    Education Details  dilator and moisturizers    Person(s) Educated  Patient;Other (comment) friend who accompanied patient    Methods  Explanation;Handout    Comprehension  Verbalized understanding       PT Short Term Goals - 05/24/18 1741      PT SHORT TERM GOAL #1   Title  pt will be able to  use dilators independantly to work on improved soft tissue elasticity at home    Time  3    Period  Weeks    Status   New    Target Date  06/14/18      PT SHORT TERM GOAL #2   Title  pt will demonstrate ability to perform kegel with 2/5 MMT and hold for 3 sec due to improved muscle elasticity and endurance        PT Long Term Goals - 05/24/18 1742      PT LONG TERM GOAL #1   Title  pt will demonstrate ability to perform kegel with 2/5 MMT and hold for 8 sec due to improved muscle elasticity and endurance for functional activities    Baseline  2/5 MMT x 1 second    Time  16    Period  Weeks    Status  New    Target Date  09/13/18      PT LONG TERM GOAL #2   Title  pt will be ind with advanced HEP in order to adress soft tissue deficits from cancer treatments and improve quality of life with functional activities.    Baseline  does not know/ has not learned HEP    Time  16    Period  Weeks    Status  New    Target Date  09/13/18      PT LONG TERM GOAL #3   Title  Pt will report 1/3 on Marinoff scale due to improved soft tissue length    Baseline  3/3 - pain prevents attempts at intercourse    Time  16    Period  Weeks    Status  New    Target Date  09/13/18      PT LONG TERM GOAL #4   Title  pt will demonstrate hip abduction of at least 25 deg or more bilaterally and hip flexion of at least 90 degrees for improved functional activities    Baseline  abduction 15 deg; flexion 70 deg    Time  16    Period  Weeks    Status  New    Target Date  09/13/18             Plan - 05/24/18 1752    Clinical Impression Statement  Pt presents to skilled PT due to pain with intercourse and self care such as pelvic exams.  She also has urinary leakage at nighttime.  Pt has high tone and stenosis of pelvic floor.  She demonstrates weakness 2/5MMT and bilateral hip weakness of 4/5.  Pt has decreased hip ROM bilaterally.  Pt has posture and gait abnormalities as mentioned above.  Pt has muscle and fascial restrictions throughout pelvic floor and hip adductors including scar tissue adhesion in pelvic  floor.  Pt will beneti from skilled PT to address impairments and return to maximum function and quality of life.    History and Personal Factors relevant to plan of care:  rectal cancer, radiation and chemo treatments to pelvic floor    Clinical Presentation  Evolving    Clinical Presentation due to:  pt has had worsening pain since cancer treatments    Clinical Decision Making  Moderate    Clinical Impairments Affecting Rehab Potential  ileostomy in place, drainage of rectal surgical wound    PT Frequency  2x / week reduce to 1x as able    PT Duration  -- 16 weeks    PT  Treatment/Interventions  ADLs/Self Care Home Management;Neuromuscular re-education;Biofeedback;Cryotherapy;Electrical Stimulation;Moist Heat;Therapeutic activities;Therapeutic exercise;Manual techniques;Patient/family education;Scar mobilization;Passive range of motion;Dry needling;Taping    PT Next Visit Plan  STM internally, educate self massage, stretches and ROM lumbar and hip    PT Home Exercise Plan  dilators    Recommended Other Services  eval 05/24/18    Consulted and Agree with Plan of Care  Patient friend was present       Patient will benefit from skilled therapeutic intervention in order to improve the following deficits and impairments:  Abnormal gait, Pain, Decreased strength, Increased fascial restricitons, Increased muscle spasms, Decreased coordination, Impaired tone  Visit Diagnosis: Other muscle spasm  Unspecified lack of coordination  Muscle weakness (generalized)  Stiffness of left hip, not elsewhere classified  Stiffness of right hip, not elsewhere classified     Problem List Patient Active Problem List   Diagnosis Date Noted  . Wound dehiscence 07/10/2017  . Genetic testing 03/24/2017  . Hypertension 02/18/2017  . GERD (gastroesophageal reflux disease) 02/18/2017  . Rectal cancer ([place]) 01/27/2017  . Abdominal pain, epigastric   . Rectal mass   . Fever   . Anemia 01/16/2017  .  Abdominal pain   . Constipation   . Alcohol abuse   . Tobacco abuse   . Cocaine abuse ([place])   . Bipolar affective disorder ([place])   . Protein-calorie malnutrition, severe ([place]) 02/13/2014  . Suicide ideation 02/12/2014  . Suicidal ideation 02/11/2014  . Alcohol intoxication ([place]) 02/11/2014  . Transaminitis 02/11/2014  . Substance abuse ([place]) 02/11/2014  . Depression 02/11/2014  . Thrombocytopenia ([place]) 02/11/2014    Zannie Cove, PT 05/24/2018, 6:01 PM  [facility] [address] [place], [place], [address] Phone: [phone]   Fax:  [phone]  Name: Sherry Baker MRN: [id number] Date of Birth: Jan 10, 1966

## 2018-05-24 NOTE — Patient Instructions (Signed)
PROTOCOL FOR DILATORS   1. Wash dilator with soap and water prior to insertion.    2. Lay on your back reclined. Knees are to be up and apart while on your bed or in the bathtub with warm water.   3. Lubricate the end of the dilator with a water-soluble lubricant.  4. Separate the labia.   5. Tense the pelvic floor muscles than relax; while relaxing, slide lubricated dilator into the vagina.    6. Tense muscles again while holding the dilator so it does not get pushed out; relax and slide it in a little further.   7. Try blowing out as if filling a balloon; this may relax the muscles and allow penetration.  Repeat blowing out to insert dilator further.  8. Keep dilator in for 10 minutes if tolerate, with the pelvic floor muscles relaxed to further stretch the canal.   9. Never force the dilator into the canal.  10. 1-2 times per day   https://www.hodges.com/  Store ---> Pelvic Pain ---> Dilators     Moisturizers . They are used in the vagina to hydrate the mucous membrane that make up the vaginal canal. . Designed to keep a more normal acid balance (ph) . Once placed in the vagina, it will last between two to three days.  . Use 2-3 times per week at bedtime and last longer than 60 min. . Ingredients to avoid is glycerin and fragrance, can increase chance of infection . Should not be used just before sex due to causing irritation . Most are gels administered either in a tampon-shaped applicator or as a vaginal suppository. They are non-hormonal.   Types of Moisturizers . Samul Dada- drug store . Vitamin E vaginal suppositories- Whole foods, Amazon . Moist Again . Coconut oil- can break down condoms . Michail Jewels . Yes moisturizer- amazon . NeuEve Silk , NeuEve Silver for menopausal or over 65 (if have severe vaginal atrophy or cancer treatments use NeuEve Silk for  1 month than move to The Pepsi)- [employer], MapleFlower.dk . Olive and Bee intimate cream-  www.oliveandbee.com.au  Creams to use externally on the Vulva area  Albertson's (good for for cancer patients that had radiation to the area)- [place] or [employer].FlyingBasics.com.br  V-magic cream - amazon  Julva-amazon  Vital "V Wild Yam salve ( help moisturize and help with thinning vulvar area, does have [place]   Things to avoid in the vaginal area . Do not use things to irritate the vulvar area . No lotions just specialized creams for the vulva area- Neogyn, V-magic, No soaps; can use Aveeno or Calendula cleanser if needed. Must be gentle . No deodorants . No douches . Good to sleep without underwear to let the vaginal area to air out . No scrubbing: spread the lips to let warm water rinse over labias and pat dry  [facility] [address] [place], [place] [address] Phone # [phone] Fax [phone]

## 2018-05-26 DIAGNOSIS — Z933 Colostomy status: Secondary | ICD-10-CM | POA: Diagnosis not present

## 2018-06-01 DIAGNOSIS — Z933 Colostomy status: Secondary | ICD-10-CM | POA: Diagnosis not present

## 2018-06-01 DIAGNOSIS — C20 Malignant neoplasm of rectum: Secondary | ICD-10-CM | POA: Diagnosis not present

## 2018-06-01 DIAGNOSIS — R159 Full incontinence of feces: Secondary | ICD-10-CM | POA: Diagnosis not present

## 2018-06-10 ENCOUNTER — Ambulatory Visit: Payer: Medicaid Other | Attending: Oncology | Admitting: Physical Therapy

## 2018-06-10 DIAGNOSIS — M6281 Muscle weakness (generalized): Secondary | ICD-10-CM | POA: Insufficient documentation

## 2018-06-10 DIAGNOSIS — M62838 Other muscle spasm: Secondary | ICD-10-CM | POA: Diagnosis not present

## 2018-06-10 DIAGNOSIS — R279 Unspecified lack of coordination: Secondary | ICD-10-CM

## 2018-06-10 NOTE — Patient Instructions (Signed)
STRETCHING THE PELVIC FLOOR MUSCLES NO DILATOR  Supplies . Vaginal lubricant . Mirror (optional) . Gloves (optional) Positioning . Start in a semi-reclined position with your head propped up. Bend your knees and place your thumb or finger at the vaginal opening. Procedure . Apply a moderate amount of lubricant on the outer skin of your vagina, the labia minora.  Apply additional lubricant to your finger. . Spread the skin away from the vaginal opening. Place the end of your finger at the opening. . Do a maximum contraction of the pelvic floor muscles. Tighten the vagina and the anus maximally and relax. . When you know they are relaxed, gently and slowly insert your finger into your vagina, directing your finger slightly downward, for 2-3 inches of insertion. . Relax and stretch the 6 o'clock position . Hold each stretch for _2 min__ and repeat __1_ time with rest breaks of _1__ seconds between each stretch. . Repeat the stretching in the 4 o'clock and 8 o'clock positions. . Total time should be _6__ minutes, _1__ x per day.  Note the amount of theme your were able to achieve and your tolerance to your finger in your vagina. . Once you have accomplished the techniques you may try them in standing with one foot resting on the tub, or in other positions.  This is a good stretch to do in the shower if you don't need to use lubricant.   [facility] [address], [address] [place], [place] [address] Phone # [phone] Fax [phone]  

## 2018-06-10 NOTE — Therapy (Signed)
[facility] [address], [place], [place], [address] Phone: [phone]   Fax:  [phone]  Physical Therapy Treatment  Patient Details  Name: Denisa Enterline MRN: [id number] Date of Birth: 1966/08/11 Referring Provider: Ladell Pier, MD   Encounter Date: 06/10/2018  PT End of Session - 06/10/18 1202    Visit Number  2    Date for PT Re-Evaluation  09/13/18    PT Start Time  3435    PT Stop Time  1225    PT Time Calculation (min)  27 min    Activity Tolerance  Patient limited by pain    Behavior During Therapy  [facility] for tasks assessed/performed       Past Medical History:  Diagnosis Date  . Alcohol abuse   . Allergy    PCNS swelling  . Arthritis   . Bipolar 1 disorder ([place])   . Cancer ([place]) 01/21/2017   rectal cancer  . Cirrhosis of liver ([place])   . Depression   . Genetic testing 03/24/2017   Ms. Farina underwent genetic counseling and testing for hereditary cancer syndromes on 02/17/2017. Her results were negative for mutations in all 46 genes analyzed by [employer]'s 46-gene Common Hereditary Cancers Panel. Genes analyzed include: APC, ATM, AXIN2, BARD1, BMPR1A, BRCA1, BRCA2, BRIP1, CDH1, CDKN2A, CHEK2, CTNNA1, DICER1, EPCAM, GREM1, HOXB13, KIT, MEN1, MLH1, MSH2, MSH3, MSH6, MUTYH, NBN,  . Hypertension     Past Surgical History:  Procedure Laterality Date  . ABDOMINAL PERINEAL BOWEL RESECTION N/A 06/18/2017   Procedure: ABDOMINAL PERINEAL RESECTION ERAS PATHWAY;  Surgeon: Leighton Ruff, MD;  [facility];  Service: General;  Laterality: N/A;  . COLONOSCOPY WITH PROPOFOL Left 01/21/2017   Procedure: COLONOSCOPY WITH PROPOFOL;  Surgeon: Otis Brace, MD;  Location: [facility];  Service: Gastroenterology;  Laterality: Left;  . FRACTURE SURGERY    . MANDIBLE FRACTURE SURGERY      There were no vitals filed for this visit.  Subjective Assessment - 06/10/18 1319    Subjective  Pt has not gotten  dilators yet.  She did attempt using her finger 2x.    Patient is accompained by:  --    Currently in Pain?  No/denies                       [facility] Adult PT Treatment/Exercise - 06/10/18 0001      Self-Care   Other Self-Care Comments   educated on self massage      Manual Therapy   Manual Therapy  Internal Pelvic Floor    Manual therapy comments  pt informed and consent given to perform intenal soft tissue release    Internal Pelvic Floor  bulboscavernosis, transverse peroneus, ischiocav; fascial release from urethra              PT Education - 06/10/18 1322    Education Details  self massage without dilator    Person(s) Educated  Patient;Other (comment)    Methods  Explanation;Handout    Comprehension  Verbalized understanding       PT Short Term Goals - 06/10/18 1229      PT SHORT TERM GOAL #1   Title  pt will be able to use dilators independantly to work on improved soft tissue elasticity at home    Status  Not Met      PT SHORT TERM GOAL #2   Title  pt will demonstrate ability to perform kegel with 2/5 MMT  and hold for 3 sec due to improved muscle elasticity and endurance    Status  Not Met        PT Long Term Goals - 06/10/18 1221      PT LONG TERM GOAL #1   Title  pt will demonstrate ability to perform kegel with 2/5 MMT and hold for 8 sec due to improved muscle elasticity and endurance for functional activities    Status  Not Met      PT LONG TERM GOAL #2   Title  pt will be ind with advanced HEP in order to adress soft tissue deficits from cancer treatments and improve quality of life with functional activities.    Baseline  pt is ind with HEP    Status  Achieved      PT LONG TERM GOAL #3   Title  Pt will report 1/3 on Marinoff scale due to improved soft tissue length    Baseline  has not attempted    Status  Not Met      PT LONG TERM GOAL #4   Title  pt will demonstrate hip abduction of at least 25 deg or more bilaterally and hip  flexion of at least 90 degrees for improved functional activities    Status  Not Met            Plan - 06/10/18 1303    Clinical Impression Statement  Pt presents to PT for first treatment.  Pt and therapist discussed and she agrees that it would be best for her to just work on the self stretch and massage and get the dilators when ready.  She has information on how to use dilators and how to stretch pelvic floor muscles with her finger.  Pt has very tight tissues especially transerve peroneus and will benefit from consistently working on HEP.  Pt is discharged today with HEP.    PT Treatment/Interventions  ADLs/Self Care Home Management;Neuromuscular re-education;Biofeedback;Cryotherapy;Electrical Stimulation;Moist Heat;Therapeutic activities;Therapeutic exercise;Manual techniques;Patient/family education;Scar mobilization;Passive range of motion;Dry needling;Taping    PT Next Visit Plan  discharged    Consulted and Agree with Plan of Care  Patient       Patient will benefit from skilled therapeutic intervention in order to improve the following deficits and impairments:  Abnormal gait, Pain, Decreased strength, Increased fascial restricitons, Increased muscle spasms, Decreased coordination, Impaired tone  Visit Diagnosis: Other muscle spasm  Unspecified lack of coordination  Muscle weakness (generalized)     Problem List Patient Active Problem List   Diagnosis Date Noted  . Wound dehiscence 07/10/2017  . Genetic testing 03/24/2017  . Hypertension 02/18/2017  . GERD (gastroesophageal reflux disease) 02/18/2017  . Rectal cancer ([place]) 01/27/2017  . Abdominal pain, epigastric   . Rectal mass   . Fever   . Anemia 01/16/2017  . Abdominal pain   . Constipation   . Alcohol abuse   . Tobacco abuse   . Cocaine abuse ([place])   . Bipolar affective disorder ([place])   . Protein-calorie malnutrition, severe ([place]) 02/13/2014  . Suicide ideation 02/12/2014  . Suicidal ideation  02/11/2014  . Alcohol intoxication ([place]) 02/11/2014  . Transaminitis 02/11/2014  . Substance abuse ([place]) 02/11/2014  . Depression 02/11/2014  . Thrombocytopenia ([place]) 02/11/2014    Zannie Cove, PT 06/10/2018, 1:28 PM  [facility] [address], [place], [place], [address] Phone: [phone]   Fax:  [phone]  Name: [name] MRN: [id number] Date of Birth: 11/08/1965  PHYSICAL THERAPY DISCHARGE SUMMARY  Visits from Start of Care: 2  Current functional level related to goals / functional outcomes: See above   Remaining deficits: See above   Education / Equipment: HEP  Plan: Patient agrees to discharge.  Patient goals were not met. Patient is being discharged due to being pleased with the current functional level.  ?????    Pt is able to work on concerning issue at home at this time, she is independent with self massage techniques  Zannie Cove, PT 06/10/18 1:29 PM

## 2018-06-22 ENCOUNTER — Emergency Department (HOSPITAL_COMMUNITY)
Admission: EM | Admit: 2018-06-22 | Discharge: 2018-06-22 | Discharge status: Against Medical Advice | Payer: Medicaid Other | Attending: Emergency Medicine | Admitting: Emergency Medicine

## 2018-06-22 DIAGNOSIS — B889 Infestation, unspecified: Secondary | ICD-10-CM | POA: Diagnosis not present

## 2018-06-22 DIAGNOSIS — R52 Pain, unspecified: Secondary | ICD-10-CM | POA: Diagnosis not present

## 2018-06-22 DIAGNOSIS — B888 Other specified infestations: Secondary | ICD-10-CM | POA: Insufficient documentation

## 2018-06-22 DIAGNOSIS — R262 Difficulty in walking, not elsewhere classified: Secondary | ICD-10-CM | POA: Diagnosis not present

## 2018-06-22 DIAGNOSIS — Z5321 Procedure and treatment not carried out due to patient leaving prior to being seen by health care provider: Secondary | ICD-10-CM | POA: Diagnosis not present

## 2018-06-22 DIAGNOSIS — K6289 Other specified diseases of anus and rectum: Secondary | ICD-10-CM | POA: Diagnosis not present

## 2018-06-22 MED ORDER — SODIUM CHLORIDE 0.9 % IV BOLUS: 1000.0000 mL | Freq: Once | INTRAVENOUS | Status: DC

## 2018-06-22 MED ORDER — MORPHINE SULFATE (PF) 4 MG/ML IV SOLN
4.0000 mg | Freq: Once | INTRAVENOUS | Status: DC
  Filled 2018-06-22: qty 1

## 2018-06-22 NOTE — ED Triage Notes (Signed)
Rectal pain x2 weeks Worse today, causing her to not be able to walk, patient is worried about infection Hx rectal CA Full colostomy bag 50 fentanyl 20 L hand BP 132/76, HR 108, R 18

## 2018-06-22 NOTE — ED Notes (Signed)
Bed: WA08 Expected date:  Expected time:  Means of arrival:  Comments: EMS Rectal CA pain

## 2018-06-22 NOTE — ED Notes (Addendum)
Bed bugs found in patients room. 1 dead on patient and more than 10 in patients purse. 4 bed bugs were collected and placed in urine cup. Camera operator, EVS, and Network engineer alerted. Room cleaned with bleach and everything double bagged in room still. Patient decided to leave AMA without signing out. Attempted to get patient to come back. Patient left anyways.

## 2018-06-22 NOTE — ED Provider Notes (Cosign Needed)
[facility] DEPT Provider Note   CSN: [id number] Arrival date & time: 06/22/18  1217     History   Chief Complaint Chief Complaint  Patient presents with  . Rectal Pain    HPI Sherry Baker is a 52 y.o. female with a past medical history of rectal adenocarcinoma, HTN, alcohol abuse, cirrhosis of liver, who presents to ED for evaluation of rectal pain for the past 2 years, worsening over the past 2 weeks. Patient is a poor historian. States she has NBNB emesis "sometimes" but does not specify when or how often. Denies dysuria. States that ostomy output has been normal. Does not take anything for pain because "no one is giving me anything." Denies any fevers, blood in stool.  HPI  Past Medical History:  Diagnosis Date  . Alcohol abuse   . Allergy    PCNS swelling  . Arthritis   . Bipolar 1 disorder ([place])   . Cancer ([place]) 01/21/2017   rectal cancer  . Cirrhosis of liver ([place])   . Depression   . Genetic testing 03/24/2017   Ms. Sabater underwent genetic counseling and testing for hereditary cancer syndromes on 02/17/2017. Her results were negative for mutations in all 46 genes analyzed by [facility]'s 46-gene Common Hereditary Cancers Panel. Genes analyzed include: APC, ATM, AXIN2, BARD1, BMPR1A, BRCA1, BRCA2, BRIP1, CDH1, CDKN2A, CHEK2, CTNNA1, DICER1, EPCAM, GREM1, HOXB13, KIT, MEN1, MLH1, MSH2, MSH3, MSH6, MUTYH, NBN,  . Hypertension     Patient Active Problem List   Diagnosis Date Noted  . Wound dehiscence 07/10/2017  . Genetic testing 03/24/2017  . Hypertension 02/18/2017  . GERD (gastroesophageal reflux disease) 02/18/2017  . Rectal cancer ([place]) 01/27/2017  . Abdominal pain, epigastric   . Rectal mass   . Fever   . Anemia 01/16/2017  . Abdominal pain   . Constipation   . Alcohol abuse   . Tobacco abuse   . Cocaine abuse ([place])   . Bipolar affective disorder ([place])   . Protein-calorie malnutrition, severe ([place]) 02/13/2014  . Suicide  ideation 02/12/2014  . Suicidal ideation 02/11/2014  . Alcohol intoxication ([place]) 02/11/2014  . Transaminitis 02/11/2014  . Substance abuse ([place]) 02/11/2014  . Depression 02/11/2014  . Thrombocytopenia ([place]) 02/11/2014    Past Surgical History:  Procedure Laterality Date  . ABDOMINAL PERINEAL BOWEL RESECTION N/A 06/18/2017   Procedure: ABDOMINAL PERINEAL RESECTION ERAS PATHWAY;  Surgeon: Leighton Ruff, MD;  [facility];  Service: General;  Laterality: N/A;  . COLONOSCOPY WITH PROPOFOL Left 01/21/2017   Procedure: COLONOSCOPY WITH PROPOFOL;  Surgeon: Otis Brace, MD;  Location: [place];  Service: Gastroenterology;  Laterality: Left;  . FRACTURE SURGERY    . MANDIBLE FRACTURE SURGERY       OB History   None      Home Medications    Prior to Admission medications   Medication Sig Start Date End Date Taking? Authorizing Provider  naproxen sodium (ANAPROX) 220 MG tablet Take 660 mg by mouth 2 (two) times daily as needed (pain).    Yes [provider]  feeding supplement, ENSURE ENLIVE, (ENSURE ENLIVE) LIQD Take 237 mLs by mouth 3 (three) times daily between meals. Patient not taking: Reported on 06/22/2018 04/29/02   Leighton Ruff, MD  Multiple Vitamin (MULTIVITAMIN WITH MINERALS) TABS tablet Take 1 tablet by mouth daily. 5/00/93   Leighton Ruff, MD  ondansetron (ZOFRAN ODT) 4 MG disintegrating tablet Take 1 tablet (4 mg total) by mouth every 8 (eight) hours as needed for  nausea or vomiting. Patient not taking: Reported on 04/29/2018 12/23/17   Wieters, Madelynn Done C, PA-C  Oxycodone HCl 10 MG TABS Take 0.5 tablets (5 mg total) by mouth every 8 (eight) hours as needed. Patient not taking: Reported on 04/29/2018 08/01/17 08/01/18  Welford Roche, MD  ziprasidone (GEODON) 40 MG capsule Take 1 capsule (40 mg total) by mouth 2 (two) times daily with a meal. 11/06/11 01/09/12  Daleen Bo, MD    Family History Family History  Problem Relation Age of Onset  .  Cancer Mother 54       Originating in the abdomen, otherwise unknwon  . Cancer - Other Maternal Aunt        Throat    Social History Social History   Tobacco Use  . Smoking status: Current Every Day Smoker    Packs/day: 0.50    Years: 39.00    Pack years: 19.50    Types: Cigarettes  . Smokeless tobacco: Never Used  Substance Use Topics  . Alcohol use: Yes    Comment: 07/28/2017 "40oz beer/day; if I have it"  . Drug use: No    Types: "Crack" cocaine    Comment: 07/28/2017 "nothing in 3 wks"     Allergies   Penicillins   Review of Systems Review of Systems  Constitutional: Negative for appetite change, chills and fever.  HENT: Negative for ear pain, rhinorrhea, sneezing and sore throat.   Eyes: Negative for photophobia and visual disturbance.  Respiratory: Negative for cough, chest tightness, shortness of breath and wheezing.   Cardiovascular: Negative for chest pain and palpitations.  Gastrointestinal: Negative for abdominal pain, blood in stool, constipation, diarrhea, nausea and vomiting.       +rectal pain  Genitourinary: Negative for dysuria, hematuria and urgency.  Musculoskeletal: Negative for myalgias.  Skin: Negative for rash.  Neurological: Negative for dizziness, weakness and light-headedness.     Physical Exam Updated Vital Signs BP (!) 114/103 (BP Location: Right Arm)   Temp 97.8 F (36.6 C) (Oral)   Resp 18   Ht _0  (1.575 m)   Wt 54.4 kg   BMI 21.95 kg/m   Physical Exam  Constitutional: She appears well-developed and well-nourished. No distress.  HENT:  Head: Normocephalic and atraumatic.  Nose: Nose normal.  Eyes: Conjunctivae and EOM are normal. Left eye exhibits no discharge. No scleral icterus.  Neck: Normal range of motion. Neck supple.  Cardiovascular: Normal rate, regular rhythm, normal heart sounds and intact distal pulses. Exam reveals no gallop and no friction rub.  No murmur heard. Pulmonary/Chest: Effort normal and breath  sounds normal. No respiratory distress.  Abdominal: Soft. Bowel sounds are normal. She exhibits no distension. There is no tenderness. There is no guarding.  Ostomy bag full with brown colored stool. No surrounding erythema or signs of infection.  Musculoskeletal: Normal range of motion. She exhibits no edema.  Neurological: She is alert. She exhibits normal muscle tone. Coordination normal.  Skin: Skin is warm and dry. No rash noted.  Psychiatric: She has a normal mood and affect.  Nursing note and vitals reviewed.    ED Treatments / Results  Labs (all labs ordered are listed, but only abnormal results are displayed) Labs Reviewed  COMPREHENSIVE METABOLIC PANEL  CBC WITH DIFFERENTIAL/PLATELET    EKG None  Radiology No results found.  Procedures Procedures (including critical care time)  Medications Ordered in ED Medications  morphine 4 MG/ML injection 4 mg (has no administration in time range)  sodium chloride 0.9 %  bolus 1,000 mL (has no administration in time range)     Initial Impression / Assessment and Plan / ED Course  I have reviewed the triage vital signs and the nursing notes.  Pertinent labs & imaging results that were available during my care of the patient were reviewed by me and considered in my medical decision making (see chart for details).     Patient eloped prior to any evaluation or treatment.  Final Clinical Impressions(s) / ED Diagnoses   Final diagnoses:  None    ED Discharge Orders    None       Delia Heady, PA-C 06/22/18 1406

## 2018-06-23 ENCOUNTER — Telehealth: Payer: Self-pay

## 2018-06-23 NOTE — Telephone Encounter (Signed)
Received call from [place] on behalf of patient. Delilah Shan states that pt was seen in the ED yesterday because she "couldn't walk and has pain and weakness". Delilah Shan states that pt received previous report that she "needs double hip replacement" and is requesting referral to orthopaedics. Per Dr. Benay Spice, pt will need to return to ED for evaluation of pain/weakness to determine what referral needs to be made if any. Kendall voiced understanding and will relay information to pt.

## 2018-06-24 ENCOUNTER — Encounter (HOSPITAL_COMMUNITY): Payer: Self-pay | Admitting: Emergency Medicine

## 2018-06-24 ENCOUNTER — Ambulatory Visit (HOSPITAL_COMMUNITY)
Admission: EM | Admit: 2018-06-24 | Discharge: 2018-06-24 | Disposition: A | Discharge status: Home / Self Care | Payer: Medicaid Other | Attending: Family Medicine | Admitting: Family Medicine

## 2018-06-24 ENCOUNTER — Ambulatory Visit (INDEPENDENT_AMBULATORY_CARE_PROVIDER_SITE_OTHER): Payer: Medicaid Other

## 2018-06-24 DIAGNOSIS — M160 Bilateral primary osteoarthritis of hip: Secondary | ICD-10-CM

## 2018-06-24 DIAGNOSIS — R102 Pelvic and perineal pain: Secondary | ICD-10-CM | POA: Diagnosis not present

## 2018-06-24 MED ORDER — WALKER MISC: 0 refills | Status: DC

## 2018-06-24 MED ORDER — CELECOXIB 50 MG PO CAPS: 50.0000 mg | ORAL_CAPSULE | Freq: Two times a day (BID) | ORAL | 0 refills | Status: DC

## 2018-06-24 NOTE — ED Triage Notes (Addendum)
Pt states shes supposed to have hip replacements, pt c/o bilateral hip pain, hurts to walk, painful ambulation. Pt walks with steady gait. Pt c/o worsening pain for the last three days. Pt requesting a walker to go home with.

## 2018-06-24 NOTE — Discharge Instructions (Addendum)
Arthritis again confirmed on xray today.  Please follow up with orthopedics as needed.  May start twice a day celebrex to help with arthritis pain.  Please establish with a primary care provider for continued management of your symptoms.

## 2018-06-24 NOTE — ED Provider Notes (Signed)
[facility]    CSN: [id number] Arrival date & time: 06/24/18  1220     History   Chief Complaint Chief Complaint  Patient presents with  . Hip Pain    HPI Sherry Baker is a 52 y.o. female.   [name] presents with friend with complaints of bilateral hip pain. This has been chronic in nature, she has been told she needs "bilateral hip replacements." however, over the past three days the pain has increased bilaterally. No injury or fall. She occasionally takes ibuprofen for aleve, has not taken any today. She is ambulatory. States she also has some vaginal irritation. Had been doing physical therapy with dilation of vagina recommended. She was unable to afford the dilators so was told to use her finger for manual dilation, with coconut oil. She feels this irritated her so she has stopped. She wears briefs as she has a chronic rectal wound s/p rectal cancer tx and radiation. She states she is scheduled to have consultation with plastics next month for closure of this. She follows with oncology but does not have a PCP. Not taking any medications regularly. Hx of cirrhosis, depression, bipolar, alcohol abuse.     ROS per HPI.      Past Medical History:  Diagnosis Date  . Alcohol abuse   . Allergy    PCNS swelling  . Arthritis   . Bipolar 1 disorder ([place])   . Cancer ([place]) 01/21/2017   rectal cancer  . Cirrhosis of liver ([place])   . Depression   . Genetic testing 03/24/2017   Ms. Kief underwent genetic counseling and testing for hereditary cancer syndromes on 02/17/2017. Her results were negative for mutations in all 46 genes analyzed by [facility]'s 46-gene Common Hereditary Cancers Panel. Genes analyzed include: APC, ATM, AXIN2, BARD1, BMPR1A, BRCA1, BRCA2, BRIP1, CDH1, CDKN2A, CHEK2, CTNNA1, DICER1, EPCAM, GREM1, HOXB13, KIT, MEN1, MLH1, MSH2, MSH3, MSH6, MUTYH, NBN,  . Hypertension     Patient Active Problem List   Diagnosis Date Noted  . Wound dehiscence 07/10/2017    . Genetic testing 03/24/2017  . Hypertension 02/18/2017  . GERD (gastroesophageal reflux disease) 02/18/2017  . Rectal cancer ([place]) 01/27/2017  . Abdominal pain, epigastric   . Rectal mass   . Fever   . Anemia 01/16/2017  . Abdominal pain   . Constipation   . Alcohol abuse   . Tobacco abuse   . Cocaine abuse ([place])   . Bipolar affective disorder ([place])   . Protein-calorie malnutrition, severe ([place]) 02/13/2014  . Suicide ideation 02/12/2014  . Suicidal ideation 02/11/2014  . Alcohol intoxication ([place]) 02/11/2014  . Transaminitis 02/11/2014  . Substance abuse ([place]) 02/11/2014  . Depression 02/11/2014  . Thrombocytopenia ([place]) 02/11/2014    Past Surgical History:  Procedure Laterality Date  . ABDOMINAL PERINEAL BOWEL RESECTION N/A 06/18/2017   Procedure: ABDOMINAL PERINEAL RESECTION ERAS PATHWAY;  Surgeon: Leighton Ruff, MD;  [facility];  Service: General;  Laterality: N/A;  . COLONOSCOPY WITH PROPOFOL Left 01/21/2017   Procedure: COLONOSCOPY WITH PROPOFOL;  Surgeon: Otis Brace, MD;  Location: [facility];  Service: Gastroenterology;  Laterality: Left;  . FRACTURE SURGERY    . MANDIBLE FRACTURE SURGERY      OB History   None      Home Medications    Prior to Admission medications   Medication Sig Start Date End Date Taking? Authorizing Provider  celecoxib (CELEBREX) 50 MG capsule Take 1 capsule (50 mg total) by mouth 2 (two) times daily. 06/24/18  Zigmund Gottron, NP  feeding supplement, ENSURE ENLIVE, (ENSURE ENLIVE) LIQD Take 237 mLs by mouth 3 (three) times daily between meals. Patient not taking: Reported on 06/22/2018 07/27/45   Leighton Ruff, MD  Misc. Devices [facility]) MISC 1 standard walker for PRN ambulation use 06/24/18   Zigmund Gottron, NP  Multiple Vitamin (MULTIVITAMIN WITH MINERALS) TABS tablet Take 1 tablet by mouth daily. 2/86/38   Leighton Ruff, MD  naproxen sodium (ANAPROX) 220 MG tablet Take 660 mg by mouth 2 (two) times daily as needed  (pain).     [provider]  ondansetron (ZOFRAN ODT) 4 MG disintegrating tablet Take 1 tablet (4 mg total) by mouth every 8 (eight) hours as needed for nausea or vomiting. Patient not taking: Reported on 04/29/2018 12/23/17   Wieters, Madelynn Done C, PA-C  Oxycodone HCl 10 MG TABS Take 0.5 tablets (5 mg total) by mouth every 8 (eight) hours as needed. Patient not taking: Reported on 04/29/2018 08/01/17 08/01/18  Welford Roche, MD  ziprasidone (GEODON) 40 MG capsule Take 1 capsule (40 mg total) by mouth 2 (two) times daily with a meal. 11/06/11 01/09/12  Daleen Bo, MD    Family History Family History  Problem Relation Age of Onset  . Cancer Mother 50       Originating in the abdomen, otherwise unknwon  . Cancer - Other Maternal Aunt        Throat    Social History Social History   Tobacco Use  . Smoking status: Current Every Day Smoker    Packs/day: 0.50    Years: 39.00    Pack years: 19.50    Types: Cigarettes  . Smokeless tobacco: Never Used  Substance Use Topics  . Alcohol use: Yes    Comment: 07/28/2017 "40oz beer/day; if I have it"  . Drug use: No    Types: "Crack" cocaine    Comment: 07/28/2017 "nothing in 3 wks"     Allergies   Penicillins   Review of Systems Review of Systems   Physical Exam Triage Vital Signs ED Triage Vitals [06/24/18 1232]  Enc Vitals Group     BP (!) 166/94     Pulse Rate (!) 117     Resp 18     Temp 98.2 F (36.8 C)     Temp src      SpO2 100 %     Weight      Height      Head Circumference      Peak Flow      Pain Score      Pain Loc      Pain Edu?      Excl. in [place]?    No data found.  Updated Vital Signs BP (!) 166/94   Pulse (!) 117   Temp 98.2 F (36.8 C)   Resp 18   SpO2 100%    Physical Exam  Constitutional: She is oriented to person, place, and time. She appears well-developed and well-nourished. No distress.  Cardiovascular: Regular rhythm and normal heart sounds. Tachycardia present.    Pulmonary/Chest: Effort normal and breath sounds normal.  Genitourinary:  Genitourinary Comments: No rash, lesions, or discharge noted on external vulvar exam  Musculoskeletal:       Right hip: She exhibits decreased range of motion, tenderness and bony tenderness.       Left hip: She exhibits decreased range of motion, tenderness and bony tenderness.  Ambulatory with limp noted; strength equal bilaterally; gross sensation intact; generalize  bilateral hip tenderness and pain with ROM   Neurological: She is alert and oriented to person, place, and time.  Skin: Skin is warm and dry.     UC Treatments / Results  Labs (all labs ordered are listed, but only abnormal results are displayed) Labs Reviewed - No data to display  EKG None  Radiology Dg Pelvis 1-2 Views  Result Date: 06/24/2018 CLINICAL DATA:  Bilateral hip and pelvic pain EXAM: PELVIS - 1-2 VIEW COMPARISON:  None. FINDINGS: Generalized osteopenia. No acute fracture or dislocation. Severe osteoarthritis of the hip bilaterally. Flattening of the superior femoral head bilaterally with partial uncovering as can be seen with developmental dysplasia of the hips. IMPRESSION: Severe advanced osteoarthritis of bilateral hips. Electronically Signed   By: Kathreen Devoid   On: 06/24/2018 13:50    Procedures Procedures (including critical care time)  Medications Ordered in UC Medications - No data to display  Initial Impression / Assessment and Plan / UC Course  I have reviewed the triage vital signs and the nursing notes.  Pertinent labs & imaging results that were available during my care of the patient were reviewed by me and considered in my medical decision making (see chart for details).     Ambulatory. In no acute distress at this time. Xray without acute findings or visualized bony lesions. Discussed limitations of xray. Encouraged close follow up with ortho and/or pcp as well as oncology for continued management of chronic  illness. Patient and friend verbalized understanding and agreeable to plan.   Final Clinical Impressions(s) / UC Diagnoses   Final diagnoses:  Bilateral hip joint arthritis     Discharge Instructions     Arthritis again confirmed on xray today.  Please follow up with orthopedics as needed.  May start twice a day celebrex to help with arthritis pain.  Please establish with a primary care provider for continued management of your symptoms.     ED Prescriptions    Medication Sig Dispense Auth. Provider   celecoxib (CELEBREX) 50 MG capsule Take 1 capsule (50 mg total) by mouth 2 (two) times daily. 30 capsule Zigmund Gottron, NP   Misc. Devices (WALKER) MISC  (Status: Discontinued) 1 standard walker for PRN ambulation use 1 each Burky, Malachy Moan, NP   Misc. Devices (WALKER) MISC 1 standard walker for PRN ambulation use 1 each Zigmund Gottron, NP     Controlled Substance Prescriptions [place] Controlled Substance Registry consulted? Not Applicable   Zigmund Gottron, NP 06/24/18 1406

## 2018-06-25 DIAGNOSIS — Z933 Colostomy status: Secondary | ICD-10-CM | POA: Diagnosis not present

## 2018-06-29 ENCOUNTER — Ambulatory Visit: Payer: Self-pay | Admitting: Nurse Practitioner

## 2018-07-02 DIAGNOSIS — M6281 Muscle weakness (generalized): Secondary | ICD-10-CM | POA: Diagnosis not present

## 2018-07-02 DIAGNOSIS — R278 Other lack of coordination: Secondary | ICD-10-CM | POA: Diagnosis not present

## 2018-07-12 DIAGNOSIS — Z85048 Personal history of other malignant neoplasm of rectum, rectosigmoid junction, and anus: Secondary | ICD-10-CM | POA: Diagnosis not present

## 2018-07-12 DIAGNOSIS — Z923 Personal history of irradiation: Secondary | ICD-10-CM | POA: Diagnosis not present

## 2018-07-12 DIAGNOSIS — L98412 Non-pressure chronic ulcer of buttock with fat layer exposed: Secondary | ICD-10-CM | POA: Diagnosis not present

## 2018-07-13 DIAGNOSIS — Z933 Colostomy status: Secondary | ICD-10-CM | POA: Diagnosis not present

## 2018-07-13 DIAGNOSIS — C20 Malignant neoplasm of rectum: Secondary | ICD-10-CM | POA: Diagnosis not present

## 2018-07-13 DIAGNOSIS — R159 Full incontinence of feces: Secondary | ICD-10-CM | POA: Diagnosis not present

## 2018-07-26 DIAGNOSIS — Z933 Colostomy status: Secondary | ICD-10-CM | POA: Diagnosis not present

## 2018-08-10 ENCOUNTER — Encounter (HOSPITAL_COMMUNITY): Payer: Self-pay | Admitting: Emergency Medicine

## 2018-08-10 ENCOUNTER — Ambulatory Visit (HOSPITAL_COMMUNITY)
Admission: EM | Admit: 2018-08-10 | Discharge: 2018-08-10 | Disposition: A | Discharge status: Home / Self Care | Payer: Medicaid Other | Attending: Family Medicine | Admitting: Family Medicine

## 2018-08-10 DIAGNOSIS — Z85038 Personal history of other malignant neoplasm of large intestine: Secondary | ICD-10-CM | POA: Insufficient documentation

## 2018-08-10 DIAGNOSIS — R8281 Pyuria: Secondary | ICD-10-CM | POA: Diagnosis not present

## 2018-08-10 DIAGNOSIS — Z79899 Other long term (current) drug therapy: Secondary | ICD-10-CM | POA: Insufficient documentation

## 2018-08-10 DIAGNOSIS — M545 Low back pain: Secondary | ICD-10-CM

## 2018-08-10 DIAGNOSIS — F17210 Nicotine dependence, cigarettes, uncomplicated: Secondary | ICD-10-CM | POA: Diagnosis not present

## 2018-08-10 DIAGNOSIS — N760 Acute vaginitis: Secondary | ICD-10-CM

## 2018-08-10 DIAGNOSIS — N898 Other specified noninflammatory disorders of vagina: Secondary | ICD-10-CM | POA: Diagnosis not present

## 2018-08-10 DIAGNOSIS — G8929 Other chronic pain: Secondary | ICD-10-CM | POA: Diagnosis not present

## 2018-08-10 DIAGNOSIS — K219 Gastro-esophageal reflux disease without esophagitis: Secondary | ICD-10-CM | POA: Diagnosis not present

## 2018-08-10 DIAGNOSIS — Z880 Allergy status to penicillin: Secondary | ICD-10-CM | POA: Insufficient documentation

## 2018-08-10 DIAGNOSIS — I10 Essential (primary) hypertension: Secondary | ICD-10-CM | POA: Insufficient documentation

## 2018-08-10 MED ORDER — NITROFURANTOIN MONOHYD MACRO 100 MG PO CAPS: 100.0000 mg | ORAL_CAPSULE | Freq: Two times a day (BID) | ORAL | 0 refills | Status: DC

## 2018-08-10 MED ORDER — FLUCONAZOLE 150 MG PO TABS: 150.0000 mg | ORAL_TABLET | Freq: Once | ORAL | 0 refills | Status: AC

## 2018-08-10 NOTE — Discharge Instructions (Addendum)
The urine test shows that you have a yeast infection and a urinary tract infection.  Therefore I prescribed medicine for each.  The urinary tract infection medicine requires that you take the pill twice a day for a week.  The yeast medicine requires that you take 1 pill at one time and this will continue to work for a week.

## 2018-08-10 NOTE — ED Triage Notes (Signed)
Pt here for vaginal discharge  

## 2018-08-10 NOTE — ED Provider Notes (Signed)
[facility]    CSN: [id number] Arrival date & time: 08/10/18  1238     History   Chief Complaint Chief Complaint  Patient presents with  . Vaginal Discharge    HPI Sherry Baker is a 52 y.o. female.   Pt here for vaginal discharge, with burning and frequency, for the last 3 weeks.  Seen no blood and has no flank pain.  She does have chronic low back pain.  She notes some abdominal cramps at times as well.  She denies any fever.     Past Medical History:  Diagnosis Date  . Alcohol abuse   . Allergy    PCNS swelling  . Arthritis   . Bipolar 1 disorder ([place])   . Cancer ([place]) 01/21/2017   rectal cancer  . Cirrhosis of liver ([place])   . Depression   . Genetic testing 03/24/2017   Ms. Cisse underwent genetic counseling and testing for hereditary cancer syndromes on 02/17/2017. Her results were negative for mutations in all 46 genes analyzed by [facility]'s 46-gene Common Hereditary Cancers Panel. Genes analyzed include: APC, ATM, AXIN2, BARD1, BMPR1A, BRCA1, BRCA2, BRIP1, CDH1, CDKN2A, CHEK2, CTNNA1, DICER1, EPCAM, GREM1, HOXB13, KIT, MEN1, MLH1, MSH2, MSH3, MSH6, MUTYH, NBN,  . Hypertension     Patient Active Problem List   Diagnosis Date Noted  . Wound dehiscence 07/10/2017  . Genetic testing 03/24/2017  . Hypertension 02/18/2017  . GERD (gastroesophageal reflux disease) 02/18/2017  . Rectal cancer ([place]) 01/27/2017  . Abdominal pain, epigastric   . Rectal mass   . Fever   . Anemia 01/16/2017  . Abdominal pain   . Constipation   . Alcohol abuse   . Tobacco abuse   . Cocaine abuse ([place])   . Bipolar affective disorder ([place])   . Protein-calorie malnutrition, severe ([place]) 02/13/2014  . Suicide ideation 02/12/2014  . Suicidal ideation 02/11/2014  . Alcohol intoxication ([place]) 02/11/2014  . Transaminitis 02/11/2014  . Substance abuse ([place]) 02/11/2014  . Depression 02/11/2014  . Thrombocytopenia ([place]) 02/11/2014    Past Surgical History:  Procedure  Laterality Date  . ABDOMINAL PERINEAL BOWEL RESECTION N/A 06/18/2017   Procedure: ABDOMINAL PERINEAL RESECTION ERAS PATHWAY;  Surgeon: Leighton Ruff, MD;  [facility];  Service: General;  Laterality: N/A;  . COLONOSCOPY WITH PROPOFOL Left 01/21/2017   Procedure: COLONOSCOPY WITH PROPOFOL;  Surgeon: Otis Brace, MD;  Location: [facility];  Service: Gastroenterology;  Laterality: Left;  . FRACTURE SURGERY    . MANDIBLE FRACTURE SURGERY      OB History   None      Home Medications    Prior to Admission medications   Medication Sig Start Date End Date Taking? Authorizing Provider  celecoxib (CELEBREX) 50 MG capsule Take 1 capsule (50 mg total) by mouth 2 (two) times daily. 06/24/18   Zigmund Gottron, NP  feeding supplement, ENSURE ENLIVE, (ENSURE ENLIVE) LIQD Take 237 mLs by mouth 3 (three) times daily between meals. Patient not taking: Reported on 06/22/2018 2/44/01   Leighton Ruff, MD  Misc. Devices [facility]) MISC 1 standard walker for PRN ambulation use 06/24/18   Zigmund Gottron, NP  Multiple Vitamin (MULTIVITAMIN WITH MINERALS) TABS tablet Take 1 tablet by mouth daily. 0/27/25   Leighton Ruff, MD  naproxen sodium (ANAPROX) 220 MG tablet Take 660 mg by mouth 2 (two) times daily as needed (pain).     [provider]  ondansetron (ZOFRAN ODT) 4 MG disintegrating tablet Take 1 tablet (4 mg total) by  mouth every 8 (eight) hours as needed for nausea or vomiting. Patient not taking: Reported on 04/29/2018 12/23/17   Wieters, Madelynn Done C, PA-C  ziprasidone (GEODON) 40 MG capsule Take 1 capsule (40 mg total) by mouth 2 (two) times daily with a meal. 11/06/11 01/09/12  Daleen Bo, MD    Family History Family History  Problem Relation Age of Onset  . Cancer Mother 50       Originating in the abdomen, otherwise unknwon  . Cancer - Other Maternal Aunt        Throat    Social History Social History   Tobacco Use  . Smoking status: Current Every Day Smoker    Packs/day:  0.50    Years: 39.00    Pack years: 19.50    Types: Cigarettes  . Smokeless tobacco: Never Used  Substance Use Topics  . Alcohol use: Yes    Comment: 07/28/2017 "40oz beer/day; if I have it"  . Drug use: No    Types: "Crack" cocaine    Comment: 07/28/2017 "nothing in 3 wks"     Allergies   Penicillins   Review of Systems Review of Systems   Physical Exam Triage Vital Signs ED Triage Vitals [08/10/18 1324]  Enc Vitals Group     BP 113/76     Pulse Rate 96     Resp 18     Temp 97.6 F (36.4 C)     Temp Source Oral     SpO2 100 %     Weight      Height      Head Circumference      Peak Flow      Pain Score 6     Pain Loc      Pain Edu?      Excl. in [place]?    No data found.  Updated Vital Signs BP 113/76 (BP Location: Right Arm)   Pulse 96   Temp 97.6 F (36.4 C) (Oral)   Resp 18   SpO2 100%    Physical Exam  Constitutional: She is oriented to person, place, and time. She appears well-developed and well-nourished.  HENT:  Right Ear: External ear normal.  Left Ear: External ear normal.  Mouth/Throat: Oropharynx is clear and moist.  Eyes: Pupils are equal, round, and reactive to light. Conjunctivae are normal.  Neck: Normal range of motion. Neck supple.  Pulmonary/Chest: Effort normal.  Abdominal: There is no tenderness.  Musculoskeletal:  Patient using a walker.  Neurological: She is alert and oriented to person, place, and time.  Skin: Skin is warm and dry.  Psychiatric:  Patient has a Psychologist, sport and exercise  Nursing note and vitals reviewed.    UC Treatments / Results  Labs (all labs ordered are listed, but only abnormal results are displayed) Labs Reviewed - No data to display  EKG None  Radiology No results found.  Procedures Procedures (including critical care time)  Medications Ordered in UC Medications - No data to display  Initial Impression / Assessment and Plan / UC Course  I have reviewed the triage vital signs and the nursing  notes.  Pertinent labs & imaging results that were available during my care of the patient were reviewed by me and considered in my medical decision making (see chart for details).    Final Clinical Impressions(s) / UC Diagnoses   Final diagnoses:  None   Discharge Instructions   None    ED Prescriptions    None  Controlled Substance Prescriptions [place] Controlled Substance Registry consulted? Not Applicable   Robyn Haber, MD 08/10/18 [phone]

## 2018-08-10 NOTE — ED Notes (Signed)
Urinalysis test exceeded the limitations of the [employer] capabilities. Urine culture obtained

## 2018-08-12 ENCOUNTER — Telehealth (HOSPITAL_COMMUNITY): Payer: Self-pay

## 2018-08-12 DIAGNOSIS — R159 Full incontinence of feces: Secondary | ICD-10-CM | POA: Diagnosis not present

## 2018-08-12 DIAGNOSIS — C20 Malignant neoplasm of rectum: Secondary | ICD-10-CM | POA: Diagnosis not present

## 2018-08-12 DIAGNOSIS — Z933 Colostomy status: Secondary | ICD-10-CM | POA: Diagnosis not present

## 2018-08-12 LAB — URINE CULTURE: Culture: >=100000 — AB

## 2018-08-12 NOTE — Telephone Encounter (Signed)
Urine culture positive for E.coli. This was treated with macrobid at ucc visit.

## 2018-08-14 LAB — URINE CYTOLOGY ANCILLARY ONLY: Candida vaginitis: NEGATIVE

## 2018-08-16 ENCOUNTER — Telehealth (HOSPITAL_COMMUNITY): Payer: Self-pay

## 2018-08-16 MED ORDER — METRONIDAZOLE 500 MG PO TABS: 500.0000 mg | ORAL_TABLET | Freq: Two times a day (BID) | ORAL | 0 refills | Status: DC

## 2018-08-16 NOTE — Telephone Encounter (Signed)
Bacterial vaginosis is positive. This was not treated at the urgent care visit.  Flagyl 500 mg BID x 7 days #14 no refills sent to patients pharmacy of choice.  Attempted to reach patient. No answer.

## 2018-08-26 DIAGNOSIS — Z933 Colostomy status: Secondary | ICD-10-CM | POA: Diagnosis not present

## 2018-09-28 ENCOUNTER — Telehealth: Payer: Self-pay | Admitting: Oncology

## 2018-09-28 NOTE — Telephone Encounter (Signed)
Tried to call regarding 12/3 mailbox was full

## 2018-10-05 ENCOUNTER — Inpatient Hospital Stay: Payer: Medicaid Other | Attending: Oncology | Admitting: Nurse Practitioner

## 2018-11-21 ENCOUNTER — Ambulatory Visit (HOSPITAL_COMMUNITY)
Admission: EM | Admit: 2018-11-21 | Discharge: 2018-11-21 | Disposition: A | Discharge status: Home / Self Care | Payer: Medicaid Other

## 2018-11-21 ENCOUNTER — Other Ambulatory Visit: Payer: Self-pay

## 2018-11-21 ENCOUNTER — Encounter (HOSPITAL_COMMUNITY): Payer: Self-pay | Admitting: Emergency Medicine

## 2018-11-21 NOTE — ED Triage Notes (Signed)
2018 had rectal cancer.  As of Friday started having rectal bleeding.  Patient has seen blood clots.

## 2018-11-21 NOTE — ED Notes (Signed)
Discussed patient with dr Marcille Blanco.  Patient agreeable to going to ed.

## 2018-11-24 ENCOUNTER — Telehealth: Payer: Self-pay

## 2018-11-24 NOTE — Telephone Encounter (Signed)
Left a detailed msg for patient concerning a appointment that was scheduled for her per request. Per 1/22 follow up on voice msg return calls

## 2018-12-09 ENCOUNTER — Inpatient Hospital Stay: Payer: Medicaid Other | Attending: Oncology | Admitting: Nurse Practitioner

## 2018-12-10 ENCOUNTER — Telehealth: Payer: Self-pay | Admitting: Nurse Practitioner

## 2018-12-10 NOTE — Telephone Encounter (Signed)
Patient called to schedule appointment missed

## 2018-12-18 ENCOUNTER — Emergency Department (HOSPITAL_COMMUNITY)
Admission: EM | Admit: 2018-12-18 | Discharge: 2018-12-19 | Disposition: A | Discharge status: Home / Self Care | Payer: Medicaid Other | Attending: Emergency Medicine | Admitting: Emergency Medicine

## 2018-12-18 ENCOUNTER — Emergency Department (HOSPITAL_COMMUNITY): Payer: Medicaid Other

## 2018-12-18 ENCOUNTER — Encounter (HOSPITAL_COMMUNITY): Payer: Self-pay | Admitting: Emergency Medicine

## 2018-12-18 ENCOUNTER — Other Ambulatory Visit: Payer: Self-pay

## 2018-12-18 DIAGNOSIS — I10 Essential (primary) hypertension: Secondary | ICD-10-CM | POA: Insufficient documentation

## 2018-12-18 DIAGNOSIS — R58 Hemorrhage, not elsewhere classified: Secondary | ICD-10-CM | POA: Diagnosis not present

## 2018-12-18 DIAGNOSIS — K625 Hemorrhage of anus and rectum: Secondary | ICD-10-CM

## 2018-12-18 DIAGNOSIS — F17210 Nicotine dependence, cigarettes, uncomplicated: Secondary | ICD-10-CM | POA: Insufficient documentation

## 2018-12-18 DIAGNOSIS — R109 Unspecified abdominal pain: Secondary | ICD-10-CM | POA: Diagnosis present

## 2018-12-18 DIAGNOSIS — R000 Tachycardia, unspecified: Secondary | ICD-10-CM | POA: Diagnosis not present

## 2018-12-18 DIAGNOSIS — Z79899 Other long term (current) drug therapy: Secondary | ICD-10-CM | POA: Diagnosis not present

## 2018-12-18 LAB — CBC
HCT: 36 % (ref 36.0–46.0)
Hemoglobin: 11.1 g/dL — ABNORMAL LOW (ref 12.0–15.0)
MCH: 27.8 pg (ref 26.0–34.0)
MCHC: 30.8 g/dL (ref 30.0–36.0)
MCV: 90 fL (ref 80.0–100.0)
NRBC: 0 % (ref 0.0–0.2)
Platelets: 184 10*3/uL (ref 150–400)
RBC: 4 MIL/uL (ref 3.87–5.11)
RDW: 17.6 % — ABNORMAL HIGH (ref 11.5–15.5)
WBC: 6.9 10*3/uL (ref 4.0–10.5)

## 2018-12-18 LAB — COMPREHENSIVE METABOLIC PANEL
ALT: 15 U/L (ref 0–44)
AST: 27 U/L (ref 15–41)
Albumin: 3.5 g/dL (ref 3.5–5.0)
Alkaline Phosphatase: 70 U/L (ref 38–126)
Anion gap: 13 (ref 5–15)
BUN: 15 mg/dL (ref 6–20)
CO2: 14 mmol/L — AB (ref 22–32)
Calcium: 8.5 mg/dL — ABNORMAL LOW (ref 8.9–10.3)
Chloride: 107 mmol/L (ref 98–111)
Creatinine, Ser: 1.87 mg/dL — ABNORMAL HIGH (ref 0.44–1.00)
GFR calc Af Amer: 35 mL/min — ABNORMAL LOW (ref >60)
GFR calc non Af Amer: 31 mL/min — ABNORMAL LOW (ref >60)
Glucose, Bld: 71 mg/dL (ref 70–99)
Potassium: 3.7 mmol/L (ref 3.5–5.1)
Sodium: 134 mmol/L — ABNORMAL LOW (ref 135–145)
Total Bilirubin: 0.4 mg/dL (ref 0.3–1.2)
Total Protein: 8.1 g/dL (ref 6.5–8.1)

## 2018-12-18 LAB — ABO/RH: ABO/RH(D): O POS

## 2018-12-18 LAB — TYPE AND SCREEN
ABO/RH(D): O POS
Antibody Screen: NEGATIVE

## 2018-12-18 MED ORDER — LIDOCAINE HCL 2 % IJ SOLN
20.0000 mL | Freq: Once | INTRAMUSCULAR | Status: DC
  Filled 2018-12-18 (×2): qty 20

## 2018-12-18 MED ORDER — LIDOCAINE-EPINEPHRINE (PF) 2 %-1:200000 IJ SOLN
5.0000 mL | Freq: Once | INTRAMUSCULAR | Status: AC
  Administered 2018-12-18: 5 mL
  Filled 2018-12-18: qty 20

## 2018-12-18 MED ORDER — ONDANSETRON HCL 4 MG/2ML IJ SOLN
4.0000 mg | Freq: Once | INTRAMUSCULAR | Status: AC
  Administered 2018-12-18: 4 mg via INTRAVENOUS
  Filled 2018-12-18: qty 2

## 2018-12-18 NOTE — ED Triage Notes (Signed)
Pt started having rectal bleeding that started this morning. Now having large clots. Pt has colostomy. 106/72, HR 64, resp 18. AO x 4. Hx of cancer in 2008.

## 2018-12-18 NOTE — ED Notes (Signed)
Patient verbalizes understanding of discharge instructions. Opportunity for questioning and answers were provided. Armband removed by staff, pt discharged from ED.  

## 2018-12-18 NOTE — ED Provider Notes (Signed)
[facility] EMERGENCY DEPARTMENT Provider Note   CSN: [id number] Arrival date & time: 12/18/18  1655     History   Chief Complaint Chief Complaint  Patient presents with  . Rectal Bleeding    HPI Sherry Baker is a 53 y.o. female with past medical history of hypertension, alcohol abuse, rectal cancer status post abdominal perianal resection with colostomy in place, presenting to the emergency department with complaint of acute onset of worsening rectal bleeding that began earlier this morning.  Patient states she noticed a little bit of clot from her rectum, and then she noticed large clots in her depends.  She states she does not pass any stool through her rectum, only into her colostomy bag.  Normal output, no bleeding into the colostomy.  Reports some mild lower abdominal cramping.  Was diagnosed in 2018, underwent chemotherapy and radiation treatment, as well as the resection procedure, she states she has not had any treatment since that time and has been in remission.  She does drink alcohol daily, last use was today, 40 ounces of beer.  Not on anticoagulation.  The history is provided by the patient.    Past Medical History:  Diagnosis Date  . Alcohol abuse   . Allergy    PCNS swelling  . Arthritis   . Bipolar 1 disorder ([place])   . Cancer ([place]) 01/21/2017   rectal cancer  . Cirrhosis of liver ([place])   . Depression   . Genetic testing 03/24/2017   Ms. Finigan underwent genetic counseling and testing for hereditary cancer syndromes on 02/17/2017. Her results were negative for mutations in all 46 genes analyzed by [facility]'s 46-gene Common Hereditary Cancers Panel. Genes analyzed include: APC, ATM, AXIN2, BARD1, BMPR1A, BRCA1, BRCA2, BRIP1, CDH1, CDKN2A, CHEK2, CTNNA1, DICER1, EPCAM, GREM1, HOXB13, KIT, MEN1, MLH1, MSH2, MSH3, MSH6, MUTYH, NBN,  . Hypertension     Patient Active Problem List   Diagnosis Date Noted  . Wound dehiscence 07/10/2017  . Genetic  testing 03/24/2017  . Hypertension 02/18/2017  . GERD (gastroesophageal reflux disease) 02/18/2017  . Rectal cancer ([place]) 01/27/2017  . Abdominal pain, epigastric   . Rectal mass   . Fever   . Anemia 01/16/2017  . Abdominal pain   . Constipation   . Alcohol abuse   . Tobacco abuse   . Cocaine abuse ([place])   . Bipolar affective disorder ([place])   . Protein-calorie malnutrition, severe ([place]) 02/13/2014  . Suicide ideation 02/12/2014  . Suicidal ideation 02/11/2014  . Alcohol intoxication ([place]) 02/11/2014  . Transaminitis 02/11/2014  . Substance abuse ([place]) 02/11/2014  . Depression 02/11/2014  . Thrombocytopenia ([place]) 02/11/2014    Past Surgical History:  Procedure Laterality Date  . ABDOMINAL PERINEAL BOWEL RESECTION N/A 06/18/2017   Procedure: ABDOMINAL PERINEAL RESECTION ERAS PATHWAY;  Surgeon: Leighton Ruff, MD;  Location: [facility];  Service: General;  Laterality: N/A;  . COLONOSCOPY WITH PROPOFOL Left 01/21/2017   Procedure: COLONOSCOPY WITH PROPOFOL;  Surgeon: Otis Brace, MD;  Location: [facility];  Service: Gastroenterology;  Laterality: Left;  . FRACTURE SURGERY    . MANDIBLE FRACTURE SURGERY       OB History   No obstetric history on file.      Home Medications    Prior to Admission medications   Medication Sig Start Date End Date Taking? Authorizing Provider  naproxen sodium (ANAPROX) 220 MG tablet Take 660 mg by mouth 2 (two) times daily as needed (pain).    Yes [provider]  celecoxib (CELEBREX) 50 MG capsule Take 1 capsule (50 mg total) by mouth 2 (two) times daily. Patient not taking: Reported on 12/18/2018 06/24/18   Augusto Gamble B, NP  metroNIDAZOLE (FLAGYL) 500 MG tablet Take 1 tablet (500 mg total) by mouth 2 (two) times daily. Patient not taking: Reported on 12/18/2018 08/16/18   Raylene Everts, MD  Misc. Devices [facility]) MISC 1 standard walker for PRN ambulation use Patient not taking: Reported on 12/18/2018 06/24/18   Zigmund Gottron, NP  Multiple Vitamin (MULTIVITAMIN WITH MINERALS) TABS tablet Take 1 tablet by mouth daily. Patient not taking: Reported on 12/18/2018 3/97/67   Leighton Ruff, MD  nitrofurantoin, macrocrystal-monohydrate, (MACROBID) 100 MG capsule Take 1 capsule (100 mg total) by mouth 2 (two) times daily. Patient not taking: Reported on 12/18/2018 08/10/18   Robyn Haber, MD  ziprasidone (GEODON) 40 MG capsule Take 1 capsule (40 mg total) by mouth 2 (two) times daily with a meal. 11/06/11 01/09/12  Daleen Bo, MD    Family History Family History  Problem Relation Age of Onset  . Cancer Mother 38       Originating in the abdomen, otherwise unknwon  . Cancer - Other Maternal Aunt        Throat    Social History Social History   Tobacco Use  . Smoking status: Current Every Day Smoker    Packs/day: 0.50    Years: 39.00    Pack years: 19.50    Types: Cigarettes  . Smokeless tobacco: Never Used  Substance Use Topics  . Alcohol use: Yes    Comment: 07/28/2017 "40oz beer/day; if I have it"  . Drug use: No    Types: "Crack" cocaine    Comment: 07/28/2017 "nothing in 3 wks"     Allergies   Penicillins   Review of Systems Review of Systems  Gastrointestinal: Positive for abdominal pain and anal bleeding. Negative for nausea and vomiting.  All other systems reviewed and are negative.    Physical Exam Updated Vital Signs BP (!) 169/90   Pulse 97   Temp 98.2 F (36.8 C) (Oral)   Resp 14   Ht '5\' 2"'  (1.575 m)   Wt 52.2 kg   SpO2 100%   BMI 21.03 kg/m   Physical Exam Vitals signs and nursing note reviewed.  Constitutional:      Appearance: She is well-developed. She is not ill-appearing.  HENT:     Head: Normocephalic and atraumatic.  Eyes:     Conjunctiva/sclera: Conjunctivae normal.  Cardiovascular:     Rate and Rhythm: Normal rate and regular rhythm.  Pulmonary:     Effort: Pulmonary effort is normal.     Breath sounds: Normal breath sounds.  Abdominal:     General: A  surgical scar is present. Bowel sounds are normal.     Palpations: Abdomen is soft.     Comments: Left lower colostomy bag in place.  Small amount of bowel prolapse.  No blood.  Small amount of brown/yellow stool.  Generalized lower abdominal tenderness, no guarding or rebound.  Genitourinary:    Comments: Exam performed with nurse tech chaperone present.  Status post perianal bowel resection.  Abnormal anatomy to the rectum, there does not appear to be anus present.  Tissue is boggy with a small bleed, hemostasis achieved with direct pressure. Skin:    General: Skin is warm.  Neurological:     Mental Status: She is alert.  Psychiatric:  Behavior: Behavior normal.      ED Treatments / Results  Labs (all labs ordered are listed, but only abnormal results are displayed) Labs Reviewed  COMPREHENSIVE METABOLIC PANEL - Abnormal; Notable for the following components:      Result Value   Sodium 134 (*)    CO2 14 (*)    Creatinine, Ser 1.87 (*)    Calcium 8.5 (*)    GFR calc non Af Amer 31 (*)    GFR calc Af Amer 35 (*)    All other components within normal limits  CBC - Abnormal; Notable for the following components:   Hemoglobin 11.1 (*)    RDW 17.6 (*)    All other components within normal limits  TYPE AND SCREEN  ABO/RH    EKG None  Radiology Ct Abdomen Pelvis Wo Contrast  Result Date: 12/18/2018 CLINICAL DATA:  Rectal bleeding beginning this morning. Colostomy. Rectal cancer. EXAM: CT ABDOMEN AND PELVIS WITHOUT CONTRAST TECHNIQUE: Multidetector CT imaging of the abdomen and pelvis was performed following the standard protocol without IV contrast. COMPARISON:  01/17/2017 FINDINGS: Lower chest: Subtle bilateral peripheral interstitial changes. Calcified plaque over the lateral circumflex and right coronary arteries. Hepatobiliary: Liver, gallbladder and biliary tree are normal. Pancreas: Normal. Spleen: Normal. Adrenals/Urinary Tract: Adrenal glands are normal. Left kidney  is normal in size. Mild right renal atrophy since the previous exam. Possible mass over the upper pole right kidney. No hydronephrosis or nephrolithiasis. Ureters and bladder are within normal. Stomach/Bowel: Stomach is normal. Small bowel is unremarkable. Appendix is normal. Evidence patient's colostomy over the left anterior mid abdominal wall. Postsurgical changes over the pelvis compatible previous abdominal perineal resection for rectal cancer. Mild soft tissues thickening over the posterior aspect of the surgical bed and presacral region likely postsurgical and post radiation changes. This density extends posteriorly to the skin surface just below the coccyx. Vascular/Lymphatic: Mild calcified plaque over the abdominal aorta. No evidence of adenopathy. Reproductive: Within normal. Other: No free fluid or focal inflammatory change. Musculoskeletal: Minimal degenerative change of the spine and severe bilateral degenerative changes of the hips. IMPRESSION: Postsurgical change compatible previous abdominal perineal resection for rectal cancer. Mild soft tissue density thickening along the posterior aspect of the surgical bed and presacral space which extends to the posterior skin surface just below the coccyx. Findings likely due to postsurgical and post radiation change. Left mid to lower abdominal colostomy unremarkable. Mild atrophy of the right kidney since the prior exam. Possible indeterminate mass over the upper pole right kidney. Recommend further evaluation with contrast-enhanced CT. Aortic Atherosclerosis (ICD10-I70.0). Atherosclerotic coronary artery disease. Electronically Signed   By: Marin Olp M.D.   On: 12/18/2018 22:49    Procedures Procedures (including critical care time)  Medications Ordered in ED Medications  lidocaine-EPINEPHrine (XYLOCAINE W/EPI) 2 %-1:200000 (PF) injection 5 mL (5 mLs Infiltration Given by Other 12/18/18 1755)  ondansetron (ZOFRAN) injection 4 mg (4 mg  Intravenous Given 12/18/18 2127)     Initial Impression / Assessment and Plan / ED Course  I have reviewed the triage vital signs and the nursing notes.  Pertinent labs & imaging results that were available during my care of the patient were reviewed by me and considered in my medical decision making (see chart for details).  Clinical Course as of Dec 19 16  Sat Dec 18, 2018  2117 Patient reevaluated.  Reports improvement in pain, however is now feeling nauseous with the oral contrast.  States she continues to not have  any bleeding per rectum.   [JR]  2309 Discussed reassuring CT results with patient.  Also discussed incidental finding of renal pole mass and recommended nonemergent follow-up imaging.  Able to plan and without complaints, safe for discharge.   [JR]    Clinical Course User Index [JR] Robinson, [place] N, PA-C    Patient with history of rectal cancer status post perianal resection with colostomy in remission, presenting to the emergency department with acute onset of rectal bleeding that began today.  Patient only passes stool through colostomy bag.  Patient reports mild abdominal cramping, however abdominal exam is reassuring.  Normal colostomy output.  Rectal exam revealing small bleed to the tissue, however hemostasis achieved with direct pressure.  Given unfamiliar/abnormal anatomy with bogginess to palpation and new bleed, CT pelvis ordered to evaluate for acute/new pathology.  CT is negative for acute findings in the pelvis or abdomen.  Labs are reassuring.  Patient monitored for multiple hours without rebleed.  Ambulated to the restroom without issue.  Discussed recommendation for outpatient follow-up regarding visit today.  Pelvic rest.  Strict return precautions.  Without complaints prior to discharge.  Patient discussed with and evaluated by Dr. Venora Maples.  Discussed results, findings, treatment and follow up. Patient advised of return precautions. Patient verbalized  understanding and agreed with plan.   Final Clinical Impressions(s) / ED Diagnoses   Final diagnoses:  Rectal bleeding    ED Discharge Orders    None       Robinson, [place] N, PA-C 12/19/18 0018    Jola Schmidt, MD 12/19/18 [phone]

## 2018-12-18 NOTE — ED Notes (Signed)
Pt reported to have a wound on her right buttock that has been bleeding since this morning. A large blood clot had formed on the outside of the wound. Bleeding now controlled.

## 2018-12-18 NOTE — Discharge Instructions (Signed)
Follow-up with your primary care provider/oncologist/surgeon regarding your visit today. Discuss your incidental finding on your kidney with your primary care provider. Return to the emergency department if your bleeding returns does not stop.

## 2018-12-20 ENCOUNTER — Inpatient Hospital Stay: Payer: Medicaid Other | Admitting: Nurse Practitioner

## 2018-12-24 DIAGNOSIS — C20 Malignant neoplasm of rectum: Secondary | ICD-10-CM | POA: Diagnosis not present

## 2018-12-24 DIAGNOSIS — R159 Full incontinence of feces: Secondary | ICD-10-CM | POA: Diagnosis not present

## 2018-12-24 DIAGNOSIS — Z933 Colostomy status: Secondary | ICD-10-CM | POA: Diagnosis not present

## 2018-12-31 DIAGNOSIS — Z933 Colostomy status: Secondary | ICD-10-CM | POA: Diagnosis not present

## 2019-01-24 DIAGNOSIS — C20 Malignant neoplasm of rectum: Secondary | ICD-10-CM | POA: Diagnosis not present

## 2019-01-24 DIAGNOSIS — Z933 Colostomy status: Secondary | ICD-10-CM | POA: Diagnosis not present

## 2019-01-24 DIAGNOSIS — R159 Full incontinence of feces: Secondary | ICD-10-CM | POA: Diagnosis not present

## 2019-02-23 DIAGNOSIS — C20 Malignant neoplasm of rectum: Secondary | ICD-10-CM | POA: Diagnosis not present

## 2019-02-23 DIAGNOSIS — R159 Full incontinence of feces: Secondary | ICD-10-CM | POA: Diagnosis not present

## 2019-02-23 DIAGNOSIS — Z933 Colostomy status: Secondary | ICD-10-CM | POA: Diagnosis not present

## 2019-03-10 DIAGNOSIS — Z933 Colostomy status: Secondary | ICD-10-CM | POA: Diagnosis not present

## 2019-03-25 DIAGNOSIS — C20 Malignant neoplasm of rectum: Secondary | ICD-10-CM | POA: Diagnosis not present

## 2019-03-25 DIAGNOSIS — Z933 Colostomy status: Secondary | ICD-10-CM | POA: Diagnosis not present

## 2019-03-25 DIAGNOSIS — R159 Full incontinence of feces: Secondary | ICD-10-CM | POA: Diagnosis not present

## 2019-04-11 DIAGNOSIS — Z933 Colostomy status: Secondary | ICD-10-CM | POA: Diagnosis not present

## 2019-04-18 ENCOUNTER — Encounter: Payer: Self-pay | Admitting: *Deleted

## 2019-04-18 NOTE — Progress Notes (Signed)
Received paperwork/order request from [employer] for patient for adult medium pull-ups. Patient has not kept any appointments since 04/29/2018. Sent forms back with message that she needs to be seen before this will be approved. Sent scheduling message to work on f/u visit.

## 2019-04-19 ENCOUNTER — Telehealth: Payer: Self-pay | Admitting: Oncology

## 2019-04-19 NOTE — Telephone Encounter (Signed)
I could not reach patient mailbox was full will mail

## 2019-04-25 DIAGNOSIS — C20 Malignant neoplasm of rectum: Secondary | ICD-10-CM | POA: Diagnosis not present

## 2019-04-25 DIAGNOSIS — R159 Full incontinence of feces: Secondary | ICD-10-CM | POA: Diagnosis not present

## 2019-04-25 DIAGNOSIS — Z933 Colostomy status: Secondary | ICD-10-CM | POA: Diagnosis not present

## 2019-05-18 ENCOUNTER — Inpatient Hospital Stay (HOSPITAL_COMMUNITY)
Admission: EM | Admit: 2019-05-18 | Discharge: 2019-05-21 | DRG: 917 | Disposition: A | Discharge status: Home / Self Care | Payer: Medicaid Other | Attending: Internal Medicine | Admitting: Internal Medicine

## 2019-05-18 ENCOUNTER — Emergency Department (HOSPITAL_COMMUNITY): Payer: Medicaid Other

## 2019-05-18 ENCOUNTER — Observation Stay (HOSPITAL_COMMUNITY): Payer: Medicaid Other

## 2019-05-18 ENCOUNTER — Encounter (HOSPITAL_COMMUNITY): Payer: Self-pay | Admitting: Internal Medicine

## 2019-05-18 DIAGNOSIS — F10921 Alcohol use, unspecified with intoxication delirium: Secondary | ICD-10-CM | POA: Diagnosis present

## 2019-05-18 DIAGNOSIS — F10920 Alcohol use, unspecified with intoxication, uncomplicated: Secondary | ICD-10-CM | POA: Diagnosis not present

## 2019-05-18 DIAGNOSIS — Z1159 Encounter for screening for other viral diseases: Secondary | ICD-10-CM

## 2019-05-18 DIAGNOSIS — Z03818 Encounter for observation for suspected exposure to other biological agents ruled out: Secondary | ICD-10-CM | POA: Diagnosis not present

## 2019-05-18 DIAGNOSIS — G4089 Other seizures: Secondary | ICD-10-CM | POA: Diagnosis present

## 2019-05-18 DIAGNOSIS — F17210 Nicotine dependence, cigarettes, uncomplicated: Secondary | ICD-10-CM | POA: Diagnosis present

## 2019-05-18 DIAGNOSIS — N390 Urinary tract infection, site not specified: Secondary | ICD-10-CM | POA: Diagnosis not present

## 2019-05-18 DIAGNOSIS — Z79899 Other long term (current) drug therapy: Secondary | ICD-10-CM

## 2019-05-18 DIAGNOSIS — T405X1A Poisoning by cocaine, accidental (unintentional), initial encounter: Principal | ICD-10-CM | POA: Diagnosis present

## 2019-05-18 DIAGNOSIS — F10929 Alcohol use, unspecified with intoxication, unspecified: Secondary | ICD-10-CM | POA: Diagnosis present

## 2019-05-18 DIAGNOSIS — E860 Dehydration: Secondary | ICD-10-CM | POA: Diagnosis present

## 2019-05-18 DIAGNOSIS — R404 Transient alteration of awareness: Secondary | ICD-10-CM | POA: Diagnosis not present

## 2019-05-18 DIAGNOSIS — R410 Disorientation, unspecified: Secondary | ICD-10-CM | POA: Diagnosis not present

## 2019-05-18 DIAGNOSIS — I10 Essential (primary) hypertension: Secondary | ICD-10-CM | POA: Diagnosis present

## 2019-05-18 DIAGNOSIS — G92 Toxic encephalopathy: Secondary | ICD-10-CM | POA: Diagnosis present

## 2019-05-18 DIAGNOSIS — F1910 Other psychoactive substance abuse, uncomplicated: Secondary | ICD-10-CM | POA: Diagnosis not present

## 2019-05-18 DIAGNOSIS — F1410 Cocaine abuse, uncomplicated: Secondary | ICD-10-CM | POA: Diagnosis present

## 2019-05-18 DIAGNOSIS — R569 Unspecified convulsions: Secondary | ICD-10-CM

## 2019-05-18 DIAGNOSIS — K7460 Unspecified cirrhosis of liver: Secondary | ICD-10-CM | POA: Diagnosis present

## 2019-05-18 DIAGNOSIS — E876 Hypokalemia: Secondary | ICD-10-CM | POA: Diagnosis present

## 2019-05-18 DIAGNOSIS — E512 Wernicke's encephalopathy: Secondary | ICD-10-CM | POA: Diagnosis present

## 2019-05-18 DIAGNOSIS — F10129 Alcohol abuse with intoxication, unspecified: Secondary | ICD-10-CM | POA: Diagnosis present

## 2019-05-18 DIAGNOSIS — C20 Malignant neoplasm of rectum: Secondary | ICD-10-CM | POA: Diagnosis present

## 2019-05-18 DIAGNOSIS — E162 Hypoglycemia, unspecified: Secondary | ICD-10-CM | POA: Diagnosis present

## 2019-05-18 DIAGNOSIS — N183 Chronic kidney disease, stage 3 (moderate): Secondary | ICD-10-CM | POA: Diagnosis present

## 2019-05-18 DIAGNOSIS — N179 Acute kidney failure, unspecified: Secondary | ICD-10-CM | POA: Diagnosis present

## 2019-05-18 DIAGNOSIS — G9340 Encephalopathy, unspecified: Secondary | ICD-10-CM | POA: Diagnosis not present

## 2019-05-18 DIAGNOSIS — I129 Hypertensive chronic kidney disease with stage 1 through stage 4 chronic kidney disease, or unspecified chronic kidney disease: Secondary | ICD-10-CM | POA: Diagnosis present

## 2019-05-18 DIAGNOSIS — R456 Violent behavior: Secondary | ICD-10-CM | POA: Diagnosis not present

## 2019-05-18 DIAGNOSIS — D649 Anemia, unspecified: Secondary | ICD-10-CM | POA: Diagnosis present

## 2019-05-18 DIAGNOSIS — R4020 Unspecified coma: Secondary | ICD-10-CM | POA: Diagnosis not present

## 2019-05-18 LAB — URINALYSIS, COMPLETE (UACMP) WITH MICROSCOPIC
Bilirubin Urine: NEGATIVE
Glucose, UA: NEGATIVE mg/dL
Ketones, ur: NEGATIVE mg/dL
Nitrite: NEGATIVE
Protein, ur: 30 mg/dL — AB
Specific Gravity, Urine: 1.008 (ref 1.005–1.030)
pH: 6 (ref 5.0–8.0)

## 2019-05-18 LAB — CBC WITH DIFFERENTIAL/PLATELET
Abs Immature Granulocytes: 0.03 10*3/uL (ref 0.00–0.07)
Basophils Absolute: 0 10*3/uL (ref 0.0–0.1)
Basophils Relative: 0 %
Eosinophils Absolute: 0 10*3/uL (ref 0.0–0.5)
Eosinophils Relative: 1 %
HCT: 34.2 % — ABNORMAL LOW (ref 36.0–46.0)
Hemoglobin: 10.8 g/dL — ABNORMAL LOW (ref 12.0–15.0)
Immature Granulocytes: 1 %
Lymphocytes Relative: 11 %
Lymphs Abs: 0.6 10*3/uL — ABNORMAL LOW (ref 0.7–4.0)
MCH: 28.3 pg (ref 26.0–34.0)
MCHC: 31.6 g/dL (ref 30.0–36.0)
MCV: 89.8 fL (ref 80.0–100.0)
Monocytes Absolute: 0.5 10*3/uL (ref 0.1–1.0)
Monocytes Relative: 8 %
Neutro Abs: 4.7 10*3/uL (ref 1.7–7.7)
Neutrophils Relative %: 79 %
Platelets: 202 10*3/uL (ref 150–400)
RBC: 3.81 MIL/uL — ABNORMAL LOW (ref 3.87–5.11)
RDW: 17.9 % — ABNORMAL HIGH (ref 11.5–15.5)
WBC: 5.9 10*3/uL (ref 4.0–10.5)
nRBC: 0 % (ref 0.0–0.2)

## 2019-05-18 LAB — COMPREHENSIVE METABOLIC PANEL
ALT: 20 U/L (ref 0–44)
AST: 33 U/L (ref 15–41)
Albumin: 3.8 g/dL (ref 3.5–5.0)
Alkaline Phosphatase: 82 U/L (ref 38–126)
Anion gap: 14 (ref 5–15)
BUN: 28 mg/dL — ABNORMAL HIGH (ref 6–20)
CO2: 15 mmol/L — ABNORMAL LOW (ref 22–32)
Calcium: 9.2 mg/dL (ref 8.9–10.3)
Chloride: 109 mmol/L (ref 98–111)
Creatinine, Ser: 1.97 mg/dL — ABNORMAL HIGH (ref 0.44–1.00)
GFR calc Af Amer: 33 mL/min — ABNORMAL LOW (ref >60)
GFR calc non Af Amer: 29 mL/min — ABNORMAL LOW (ref >60)
Glucose, Bld: 79 mg/dL (ref 70–99)
Potassium: 3.5 mmol/L (ref 3.5–5.1)
Sodium: 138 mmol/L (ref 135–145)
Total Bilirubin: 0.6 mg/dL (ref 0.3–1.2)
Total Protein: 8.4 g/dL — ABNORMAL HIGH (ref 6.5–8.1)

## 2019-05-18 LAB — LACTIC ACID, PLASMA: Lactic Acid, Venous: 2.3 mmol/L (ref 0.5–1.9)

## 2019-05-18 LAB — CK: Total CK: 312 U/L — ABNORMAL HIGH (ref 38–234)

## 2019-05-18 LAB — RAPID URINE DRUG SCREEN, HOSP PERFORMED
Amphetamines: NOT DETECTED
Barbiturates: NOT DETECTED
Benzodiazepines: NOT DETECTED
Cocaine: POSITIVE — AB
Opiates: NOT DETECTED
Tetrahydrocannabinol: NOT DETECTED

## 2019-05-18 LAB — SARS CORONAVIRUS 2 BY RT PCR (HOSPITAL ORDER, PERFORMED IN ~~LOC~~ HOSPITAL LAB): SARS Coronavirus 2: NEGATIVE

## 2019-05-18 LAB — ACETAMINOPHEN LEVEL: Acetaminophen (Tylenol), Serum: <10 ug/mL — ABNORMAL LOW (ref 10–30)

## 2019-05-18 LAB — ETHANOL: Alcohol, Ethyl (B): 84 mg/dL — ABNORMAL HIGH (ref <10)

## 2019-05-18 LAB — CBG MONITORING, ED
Glucose-Capillary: 79 mg/dL (ref 70–99)
Glucose-Capillary: 80 mg/dL (ref 70–99)

## 2019-05-18 LAB — SALICYLATE LEVEL: Salicylate Lvl: <7 mg/dL (ref 2.8–30.0)

## 2019-05-18 LAB — AMMONIA: Ammonia: 24 umol/L (ref 9–35)

## 2019-05-18 MED ORDER — HYDRALAZINE HCL 20 MG/ML IJ SOLN
5.0000 mg | Freq: Once | INTRAMUSCULAR | Status: AC
  Administered 2019-05-18: 5 mg via INTRAVENOUS
  Filled 2019-05-18: qty 1

## 2019-05-18 MED ORDER — THIAMINE HCL 100 MG/ML IJ SOLN
100.0000 mg | Freq: Every day | INTRAMUSCULAR | Status: DC
  Administered 2019-05-18: 100 mg via INTRAVENOUS
  Filled 2019-05-18: qty 2

## 2019-05-18 MED ORDER — SODIUM CHLORIDE 0.9 % IV SOLN
1.0000 g | Freq: Once | INTRAVENOUS | Status: AC
  Administered 2019-05-18: 1 g via INTRAVENOUS
  Filled 2019-05-18: qty 10

## 2019-05-18 MED ORDER — SODIUM CHLORIDE 0.9 % IV BOLUS
1000.0000 mL | Freq: Once | INTRAVENOUS | Status: AC
  Administered 2019-05-18: 1000 mL via INTRAVENOUS

## 2019-05-18 MED ORDER — SODIUM CHLORIDE 0.9 % IV SOLN
INTRAVENOUS | Status: DC
  Administered 2019-05-18: 18:00:00 via INTRAVENOUS

## 2019-05-18 MED ORDER — HYDRALAZINE HCL 20 MG/ML IJ SOLN
10.0000 mg | INTRAMUSCULAR | Status: DC | PRN
  Administered 2019-05-19 – 2019-05-21 (×4): 10 mg via INTRAVENOUS
  Filled 2019-05-18 (×4): qty 1

## 2019-05-18 NOTE — ED Triage Notes (Signed)
Pt brought in by ems for c/o [facility] ; EMS called out fo seizure like activity that was reported by friends; upon ems arrival , patient was alert and oriented x 4 so they left ; PD was later called by friends for "  Seizure like activity , so EMS was called out again and noticed the patient to be altered ; on the way to the hospital ems reports patient going in and out of " consciousness " and being combative ; pt not following commands at this time

## 2019-05-18 NOTE — ED Notes (Signed)
ED TO INPATIENT HANDOFF REPORT  ED Nurse Name and Phone #:  [phone]  S Name/Age/Gender Sherry Baker 53 y.o. female Room/Bed: 021C/021C  Code Status   Code Status: Prior  Home/SNF/Other Homeless Patient oriented to: self, place and time Is this baseline? Yes   Triage Complete: Triage complete  Chief Complaint AMS  Triage Note Pt brought in by ems for c/o [facility] ; EMS called out fo seizure like activity that was reported by friends; upon ems arrival , patient was alert and oriented x 4 so they left ; PD was later called by friends for "  Seizure like activity , so EMS was called out again and noticed the patient to be altered ; on the way to the hospital ems reports patient going in and out of " consciousness " and being combative ; pt not following commands at this time    Allergies Allergies  Allergen Reactions  . Penicillins Hives and Swelling    Has patient had a PCN reaction causing immediate rash, facial/tongue/throat swelling, SOB or lightheadedness with hypotension: Yes Has patient had a PCN reaction causing severe rash involving mucus membranes or skin necrosis: No Has patient had a PCN reaction that required hospitalization No Has patient had a PCN reaction occurring within the last 10 years: Yes If all of the above answers are "NO", then may proceed with Cephalosporin use.     Level of Care/Admitting Diagnosis ED Disposition    ED Disposition Condition Comment   Admit  Hospital Area: [place] [[id number]]  Level of Care: Progressive [102]  I expect the patient will be discharged within 24 hours: No (not a candidate for 5C-Observation unit)  Covid Evaluation: Asymptomatic Screening Protocol (No Symptoms)  Diagnosis: Acute encephalopathy [454098]  Admitting Physician: Rise Patience [phone]  Attending Physician: Rise Patience Lei.Right  PT Class (Do Not Modify): Observation [104]  PT Acc Code (Do Not Modify): Observation [10022]        B Medical/Surgery History Past Medical History:  Diagnosis Date  . Alcohol abuse   . Allergy    PCNS swelling  . Arthritis   . Bipolar 1 disorder ([place])   . Cancer ([place]) 01/21/2017   rectal cancer  . Cirrhosis of liver ([place])   . Depression   . Genetic testing 03/24/2017   Ms. Klasen underwent genetic counseling and testing for hereditary cancer syndromes on 02/17/2017. Her results were negative for mutations in all 46 genes analyzed by [facility]'s 46-gene Common Hereditary Cancers Panel. Genes analyzed include: APC, ATM, AXIN2, BARD1, BMPR1A, BRCA1, BRCA2, BRIP1, CDH1, CDKN2A, CHEK2, CTNNA1, DICER1, EPCAM, GREM1, HOXB13, KIT, MEN1, MLH1, MSH2, MSH3, MSH6, MUTYH, NBN,  . Hypertension    Past Surgical History:  Procedure Laterality Date  . ABDOMINAL PERINEAL BOWEL RESECTION N/A 06/18/2017   Procedure: ABDOMINAL PERINEAL RESECTION ERAS PATHWAY;  Surgeon: Leighton Ruff, MD;  Location: [facility];  Service: General;  Laterality: N/A;  . COLONOSCOPY WITH PROPOFOL Left 01/21/2017   Procedure: COLONOSCOPY WITH PROPOFOL;  Surgeon: Otis Brace, MD;  Location: [place];  Service: Gastroenterology;  Laterality: Left;  . FRACTURE SURGERY    . MANDIBLE FRACTURE SURGERY       A IV Location/Drains/Wounds Patient Lines/Drains/Airways Status   Active Line/Drains/Airways    Name:   Placement date:   Placement time:   Site:   Days:   Peripheral IV 12/18/18 Right Antecubital   12/18/18    1809    Antecubital   151   Colostomy LUQ  06/18/17    1330    LUQ   699   Incision (Closed) 06/18/17 Abdomen   06/18/17    1330     699   Wound / Incision (Open or Dehisced) 07/10/17 Incision - Open Perineum Lower   07/10/17    0440    Perineum   677          Intake/Output Last 24 hours No intake or output data in the 24 hours ending 05/18/19 2351  Labs/Imaging Results for orders placed or performed during the hospital encounter of 05/18/19 (from the past 48 hour(s))  CBG monitoring, ED     Status:  None   Collection Time: 05/18/19  4:53 PM  Result Value Ref Range   Glucose-Capillary 79 70 - 99 mg/dL  Comprehensive metabolic panel     Status: Abnormal   Collection Time: 05/18/19  5:16 PM  Result Value Ref Range   Sodium 138 135 - 145 mmol/L   Potassium 3.5 3.5 - 5.1 mmol/L   Chloride 109 98 - 111 mmol/L   CO2 15 (L) 22 - 32 mmol/L   Glucose, Bld 79 70 - 99 mg/dL   BUN 28 (H) 6 - 20 mg/dL   Creatinine, Ser 1.97 (H) 0.44 - 1.00 mg/dL   Calcium 9.2 8.9 - 10.3 mg/dL   Total Protein 8.4 (H) 6.5 - 8.1 g/dL   Albumin 3.8 3.5 - 5.0 g/dL   AST 33 15 - 41 U/L   ALT 20 0 - 44 U/L   Alkaline Phosphatase 82 38 - 126 U/L   Total Bilirubin 0.6 0.3 - 1.2 mg/dL   GFR calc non Af Amer 29 (L) >60 mL/min   GFR calc Af Amer 33 (L) >60 mL/min   Anion gap 14 5 - 15    Comment: Performed at [facility], [address] [place], [place] [address]  Ethanol     Status: Abnormal   Collection Time: 05/18/19  5:16 PM  Result Value Ref Range   Alcohol, Ethyl (B) 84 (H) <10 mg/dL    Comment: (NOTE) Lowest detectable limit for serum alcohol is 10 mg/dL. For medical purposes only. Performed at [facility], [place] [address], [place], [place] [address]   CBC with Differential     Status: Abnormal   Collection Time: 05/18/19  5:16 PM  Result Value Ref Range   WBC 5.9 4.0 - 10.5 K/uL   RBC 3.81 (L) 3.87 - 5.11 MIL/uL   Hemoglobin 10.8 (L) 12.0 - 15.0 g/dL   HCT 34.2 (L) 36.0 - 46.0 %   MCV 89.8 80.0 - 100.0 fL   MCH 28.3 26.0 - 34.0 pg   MCHC 31.6 30.0 - 36.0 g/dL   RDW 17.9 (H) 11.5 - 15.5 %   Platelets 202 150 - 400 K/uL   nRBC 0.0 0.0 - 0.2 %   Neutrophils Relative % 79 %   Neutro Abs 4.7 1.7 - 7.7 K/uL   Lymphocytes Relative 11 %   Lymphs Abs 0.6 (L) 0.7 - 4.0 K/uL   Monocytes Relative 8 %   Monocytes Absolute 0.5 0.1 - 1.0 K/uL   Eosinophils Relative 1 %   Eosinophils Absolute 0.0 0.0 - 0.5 K/uL   Basophils Relative 0 %   Basophils Absolute 0.0 0.0 - 0.1 K/uL   Immature  Granulocytes 1 %   Abs Immature Granulocytes 0.03 0.00 - 0.07 K/uL    Comment: Performed at [facility], [address] [place], [place] [address]  CK     Status: Abnormal   Collection Time: 05/18/19  5:16 PM  Result Value Ref Range   Total CK 312 (H) 38 - 234 U/L    Comment: Performed at [facility], [address] [place], [place] [address]  Acetaminophen level     Status: Abnormal   Collection Time: 05/18/19  5:16 PM  Result Value Ref Range   Acetaminophen (Tylenol), Serum <10 (L) 10 - 30 ug/mL    Comment: (NOTE) Therapeutic concentrations vary significantly. A range of 10-30 ug/mL  may be an effective concentration for many patients. However, some  are best treated at concentrations outside of this range. Acetaminophen concentrations >150 ug/mL at 4 hours after ingestion  and >50 ug/mL at 12 hours after ingestion are often associated with  toxic reactions. Performed at [facility], [address] [place], [place] [address]   Salicylate level     Status: None   Collection Time: 05/18/19  5:16 PM  Result Value Ref Range   Salicylate Lvl <7.0 2.8 - 30.0 mg/dL    Comment: Performed at [facility], [address] , [place] [address]  SARS Coronavirus 2 (CEPHEID - Performed in [place] hospital lab), Hosp Order     Status: None   Collection Time: 05/18/19  5:26 PM   Specimen: Nasopharyngeal Swab  Result Value Ref Range   SARS Coronavirus 2 NEGATIVE NEGATIVE    Comment: (NOTE) If result is NEGATIVE SARS-CoV-2 target nucleic acids are NOT DETECTED. The SARS-CoV-2 RNA is generally detectable in upper and lower  respiratory specimens during the acute phase of infection. The lowest  concentration of SARS-CoV-2 viral copies this assay can detect is 250  copies / mL. A negative result does not preclude SARS-CoV-2 infection  and should not be used as the sole basis for treatment or other  patient management decisions.  A negative result may  occur with  improper specimen collection / handling, submission of specimen other  than nasopharyngeal swab, presence of viral mutation(s) within the  areas targeted by this assay, and inadequate number of viral copies  (<250 copies / mL). A negative result must be combined with clinical  observations, patient history, and epidemiological information. If result is POSITIVE SARS-CoV-2 target nucleic acids are DETECTED. The SARS-CoV-2 RNA is generally detectable in upper and lower  respiratory specimens dur ing the acute phase of infection.  Positive  results are indicative of active infection with SARS-CoV-2.  Clinical  correlation with patient history and other diagnostic information is  necessary to determine patient infection status.  Positive results do  not rule out bacterial infection or co-infection with other viruses. If result is PRESUMPTIVE POSTIVE SARS-CoV-2 nucleic acids MAY BE PRESENT.   A presumptive positive result was obtained on the submitted specimen  and confirmed on repeat testing.  While 2019 novel coronavirus  (SARS-CoV-2) nucleic acids may be present in the submitted sample  additional confirmatory testing may be necessary for epidemiological  and / or clinical management purposes  to differentiate between  SARS-CoV-2 and other Sarbecovirus currently known to infect humans.  If clinically indicated additional testing with an alternate test  methodology ([id number]) is advised. The SARS-CoV-2 RNA is generally  detectable in upper and lower respiratory sp ecimens during the acute  phase of infection. The expected result is Negative. Fact Sheet for Patients:  https://www.fda.gov/media/136312/download Fact Sheet for Healthcare Providers: https://www.fda.gov/media/136313/download This test is not yet approved   or cleared by the [place] and has been authorized for detection and/or diagnosis of SARS-CoV-2 by FDA under an Emergency Use Authorization (EUA).  This  EUA will remain in effect (meaning this test can be used) for the duration of the COVID-19 declaration under Section 564(b)(1) of the Act, 21 U.S.C. section 360bbb-3(b)(1), unless the authorization is terminated or revoked sooner. Performed at [facility], [place] [address] [place], [place] [address]   Ammonia     Status: None   Collection Time: 05/18/19  5:30 PM  Result Value Ref Range   Ammonia 24 9 - 35 umol/L    Comment: Performed at [facility], [place] [address], [place], [place] [address]  Lactic acid, plasma     Status: Abnormal   Collection Time: 05/18/19  5:30 PM  Result Value Ref Range   Lactic Acid, Venous 2.3 (HH) 0.5 - 1.9 mmol/L    Comment: CRITICAL RESULT CALLED TO, READ BACK BY AND VERIFIED WITH: A MCOKWN,RN 1813 05/18/2019 WBOND Performed at [facility], [place] [address] [place], [place] [address]   Rapid urine drug screen (hospital performed)     Status: Abnormal   Collection Time: 05/18/19  6:00 PM  Result Value Ref Range   Opiates NONE DETECTED NONE DETECTED   Cocaine POSITIVE (A) NONE DETECTED   Benzodiazepines NONE DETECTED NONE DETECTED   Amphetamines NONE DETECTED NONE DETECTED   Tetrahydrocannabinol NONE DETECTED NONE DETECTED   Barbiturates NONE DETECTED NONE DETECTED    Comment: (NOTE) DRUG SCREEN FOR MEDICAL PURPOSES ONLY.  IF CONFIRMATION IS NEEDED FOR ANY PURPOSE, NOTIFY LAB WITHIN 5 DAYS. LOWEST DETECTABLE LIMITS FOR URINE DRUG SCREEN Drug Class                     Cutoff (ng/mL) Amphetamine and metabolites    1000 Barbiturate and metabolites    200 Benzodiazepine                 086 Tricyclics and metabolites     300 Opiates and metabolites        300 Cocaine and metabolites        300 THC                            50 Performed at [facility], [place] [address], [place], [place] [address]   Urinalysis, Complete w Microscopic     Status: Abnormal   Collection Time: 05/18/19  6:00 PM  Result Value Ref Range   Color,  Urine YELLOW YELLOW   APPearance HAZY (A) CLEAR   Specific Gravity, Urine 1.008 1.005 - 1.030   pH 6.0 5.0 - 8.0   Glucose, UA NEGATIVE NEGATIVE mg/dL   Hgb urine dipstick SMALL (A) NEGATIVE   Bilirubin Urine NEGATIVE NEGATIVE   Ketones, ur NEGATIVE NEGATIVE mg/dL   Protein, ur 30 (A) NEGATIVE mg/dL   Nitrite NEGATIVE NEGATIVE   Leukocytes,Ua LARGE (A) NEGATIVE   RBC / HPF 6-10 0 - 5 RBC/hpf   WBC, UA 21-50 0 - 5 WBC/hpf   Bacteria, UA FEW (A) NONE SEEN   Squamous Epithelial / LPF 0-5 0 - 5   Mucus PRESENT     Comment: Performed at [facility], [place] [address], [place], [place] [address]  CBG monitoring, ED     Status: None   Collection Time: 05/18/19  7:10 PM  Result Value Ref  Range   Glucose-Capillary 80 70 - 99 mg/dL   Ct Head Wo Contrast  Result Date: 05/18/2019 CLINICAL DATA:  Altered level of consciousness EXAM: CT HEAD WITHOUT CONTRAST TECHNIQUE: Contiguous axial images were obtained from the base of the skull through the vertex without intravenous contrast. COMPARISON:  11/03/2009 FINDINGS: Brain: No acute intracranial abnormality. Specifically, no hemorrhage, hydrocephalus, mass lesion, acute infarction, or significant intracranial injury. Vascular: No hyperdense vessel or unexpected calcification. Skull: No acute calvarial abnormality. Sinuses/Orbits: Visualized paranasal sinuses and mastoids clear. Orbital soft tissues unremarkable. Other: None IMPRESSION: No acute intracranial abnormality. Electronically Signed   By: Kevin  Dover M.D.   On: 05/18/2019 17:37    Pending Labs Unresulted Labs (From admission, onward)    Start     Ordered   05/18/19 2028  Urine culture  Add-on,   AD     05/18/19 2027   05/18/19 1659  Blood Cultures (routine x 2)  BLOOD CULTURE X 2,   STAT     05/18/19 1659   05/18/19 1654  Vitamin B1  Once,   STAT     05/18/19 1653   Signed and Held  HIV antibody (Routine Testing)  Tomorrow morning,   R     Signed and Held   Signed and Held  Basic  metabolic panel  Tomorrow morning,   R     Signed and Held   Signed and Held  Hepatic function panel  Tomorrow morning,   R     Signed and Held   Signed and Held  Magnesium  Tomorrow morning,   R     Signed and Held   Signed and Held  CBC WITH DIFFERENTIAL  Tomorrow morning,   R     Signed and Held          Vitals/Pain Today's Vitals   05/18/19 2130 05/18/19 2145 05/18/19 2200 05/18/19 2215  BP: (!) 165/85 (!) 186/83 (!) 191/101 (!) 184/92  Pulse:      Resp: 17 18 18 18  Temp:      TempSrc:      SpO2:      PainSc:        Isolation Precautions No active isolations  Medications Medications  sodium chloride 0.9 % bolus 1,000 mL (1,000 mLs Intravenous New Bag/Given 05/18/19 1801)    And  0.9 %  sodium chloride infusion ( Intravenous New Bag/Given 05/18/19 1801)  thiamine (B-1) injection 100 mg (has no administration in time range)  hydrALAZINE (APRESOLINE) injection 10 mg (has no administration in time range)  cefTRIAXone (ROCEPHIN) 1 g in sodium chloride 0.9 % 100 mL IVPB (0 g Intravenous Stopped 05/18/19 2337)  hydrALAZINE (APRESOLINE) injection 5 mg (5 mg Intravenous Given 05/18/19 2049)    Mobility walks High fall risk   Focused Assessments Cardiac Assessment Handoff:  Cardiac Rhythm: Normal sinus rhythm Lab Results  Component Value Date   CKTOTAL 312 (H) 05/18/2019   TROPONINI <0.03 01/17/2017   Lab Results  Component Value Date   DDIMER 0.34 11/23/2011   Does the Patient currently have chest pain? No     R Recommendations: See Admitting Provider Note  Report given to:   Additional Notes:     

## 2019-05-18 NOTE — ED Notes (Signed)
Pt iSTATs did not cross over. Printed off a copy of both [place] and gave it to Abigail(PA). Point of Care Edit sheet done and faxed over.

## 2019-05-18 NOTE — H&P (Signed)
History and Physical    Sherry Baker KXF:[id number] DOB: [birth year] DOA: 05/18/2019  PCP: Patient, No Pcp Per  Patient coming from: Patient is homeless.  History obtained from ER physician and records.  Chief Complaint: Altered mental status.  HPI: Sherry Baker is a 53 y.o. female with history of polysubstance abuse, rectal cancer cirrhosis of liver was brought to the ER after patient's friends found the patient was having altered mental status with possible seizure-like activity.  Exact nature of patient's seizure-like activity is not known.  Per report patient was drinking alcohol and abusing drugs during the episode.  ED Course: In the ER patient was found to be in altered mental status.  Responding to her name only.  CT head was unremarkable patient was afebrile.  COVID-19 test was negative.  Labs reveal UA concerning for UTI creatinine was 1.9 ammonia was 24 WBC count was 5.9 lactate was 2.3 hemoglobin 10.8 platelets 202 chest x-ray nothing acute.  Patient was started on ceftriaxone for UTI and admitted for acute encephalopathy.  At the time of my exam patient had some right upper extremity movement and was responding to her name only.  Otherwise not following commands.  Urine drug screen is positive for cocaine.  Review of Systems: As per HPI, rest all negative.   Past Medical History:  Diagnosis Date   Alcohol abuse    Allergy    PCNS swelling   Arthritis    Bipolar 1 disorder ([place])    Cancer ([place]) 01/21/2017   rectal cancer   Cirrhosis of liver ([place])    Depression    Genetic testing 03/24/2017   Ms. Shartzer underwent genetic counseling and testing for hereditary cancer syndromes on 02/17/2017. Her results were negative for mutations in all 46 genes analyzed by [facility]'s 46-gene Common Hereditary Cancers Panel. Genes analyzed include: APC, ATM, AXIN2, BARD1, BMPR1A, BRCA1, BRCA2, BRIP1, CDH1, CDKN2A, CHEK2, CTNNA1, DICER1, EPCAM, GREM1, HOXB13, KIT, MEN1, MLH1, MSH2, MSH3,  MSH6, MUTYH, NBN,   Hypertension     Past Surgical History:  Procedure Laterality Date   ABDOMINAL PERINEAL BOWEL RESECTION N/A 06/18/2017   Procedure: ABDOMINAL PERINEAL RESECTION ERAS PATHWAY;  Surgeon: Leighton Ruff, MD;  Location: [facility];  Service: General;  Laterality: N/A;   COLONOSCOPY WITH PROPOFOL Left 01/21/2017   Procedure: COLONOSCOPY WITH PROPOFOL;  Surgeon: Otis Brace, MD;  Location: [place];  Service: Gastroenterology;  Laterality: Left;   FRACTURE SURGERY     MANDIBLE FRACTURE SURGERY       reports that she has been smoking cigarettes. She has a 19.50 pack-year smoking history. She has never used smokeless tobacco. She reports current alcohol use. She reports that she does not use drugs.  Allergies  Allergen Reactions   Penicillins Hives and Swelling    Has patient had a PCN reaction causing immediate rash, facial/tongue/throat swelling, SOB or lightheadedness with hypotension: Yes Has patient had a PCN reaction causing severe rash involving mucus membranes or skin necrosis: No Has patient had a PCN reaction that required hospitalization No Has patient had a PCN reaction occurring within the last 10 years: Yes If all of the above answers are "NO", then may proceed with Cephalosporin use.     Family History  Problem Relation Age of Onset   Cancer Mother 44       Originating in the abdomen, otherwise unknwon   Cancer - Other Maternal Aunt        Throat    Prior to Admission medications  Medication Sig Start Date End Date Taking? Authorizing Provider  celecoxib (CELEBREX) 50 MG capsule Take 1 capsule (50 mg total) by mouth 2 (two) times daily. Patient not taking: Reported on 05/18/2019 06/24/18   Augusto Gamble B, NP  metroNIDAZOLE (FLAGYL) 500 MG tablet Take 1 tablet (500 mg total) by mouth 2 (two) times daily. Patient not taking: Reported on 05/18/2019 08/16/18   Raylene Everts, MD  Misc. Devices [facility]) MISC 1 standard walker for PRN  ambulation use 06/24/18   Zigmund Gottron, NP  Multiple Vitamin (MULTIVITAMIN WITH MINERALS) TABS tablet Take 1 tablet by mouth daily. Patient not taking: Reported on 05/18/2019 7/90/24   Leighton Ruff, MD  naproxen sodium (ANAPROX) 220 MG tablet Take 660 mg by mouth 2 (two) times daily as needed (pain).     [provider]  nitrofurantoin, macrocrystal-monohydrate, (MACROBID) 100 MG capsule Take 1 capsule (100 mg total) by mouth 2 (two) times daily. Patient not taking: Reported on 05/18/2019 08/10/18   Robyn Haber, MD  ziprasidone (GEODON) 40 MG capsule Take 1 capsule (40 mg total) by mouth 2 (two) times daily with a meal. 11/06/11 01/09/12  Daleen Bo, MD    Physical Exam: Constitutional: Moderately built and nourished. Vitals:   05/18/19 2130 05/18/19 2145 05/18/19 2200 05/18/19 2215  BP: (!) 165/85 (!) 186/83 (!) 191/101 (!) 184/92  Pulse:      Resp: '17 18 18 18  ' Temp:      TempSrc:      SpO2:       Eyes: Anicteric no pallor. ENMT: No discharge from the ears eyes nose or mouth. Neck: No mass or.  No neck rigidity. Respiratory: No rhonchi or crepitations. Cardiovascular: S1-S2 heard. Abdomen: Soft nontender bowel sounds are seen. Musculoskeletal: Swelling in the right wrist appears cystic.  Chronic. Skin: Chronic skin changes. Neurologic: Patient is alert but does not follow commands responds to her name moves all extremities. Psychiatric: Appears confused.   Labs on Admission: I have personally reviewed following labs and imaging studies  CBC: Recent Labs  Lab 05/18/19 1716  WBC 5.9  NEUTROABS 4.7  HGB 10.8*  HCT 34.2*  MCV 89.8  PLT 097   Basic Metabolic Panel: Recent Labs  Lab 05/18/19 1716  NA 138  K 3.5  CL 109  CO2 15*  GLUCOSE 79  BUN 28*  CREATININE 1.97*  CALCIUM 9.2   GFR: CrCl cannot be calculated (Unknown ideal weight.). Liver Function Tests: Recent Labs  Lab 05/18/19 1716  AST 33  ALT 20  ALKPHOS 82  BILITOT 0.6  PROT  8.4*  ALBUMIN 3.8   No results for input(s): LIPASE, AMYLASE in the last 168 hours. Recent Labs  Lab 05/18/19 1730  AMMONIA 24   Coagulation Profile: No results for input(s): INR, PROTIME in the last 168 hours. Cardiac Enzymes: Recent Labs  Lab 05/18/19 1716  CKTOTAL 312*   BNP (last 3 results) No results for input(s): PROBNP in the last 8760 hours. HbA1C: No results for input(s): HGBA1C in the last 72 hours. CBG: Recent Labs  Lab 05/18/19 1653 05/18/19 1910  GLUCAP 79 80   Lipid Profile: No results for input(s): CHOL, HDL, LDLCALC, TRIG, CHOLHDL, LDLDIRECT in the last 72 hours. Thyroid Function Tests: No results for input(s): TSH, T4TOTAL, FREET4, T3FREE, THYROIDAB in the last 72 hours. Anemia Panel: No results for input(s): VITAMINB12, FOLATE, FERRITIN, TIBC, IRON, RETICCTPCT in the last 72 hours. Urine analysis:    Component Value Date/Time   COLORURINE YELLOW  05/18/2019 1800   APPEARANCEUR HAZY (A) 05/18/2019 1800   LABSPEC 1.008 05/18/2019 1800   PHURINE 6.0 05/18/2019 1800   GLUCOSEU NEGATIVE 05/18/2019 1800   HGBUR SMALL (A) 05/18/2019 1800   BILIRUBINUR NEGATIVE 05/18/2019 1800   KETONESUR NEGATIVE 05/18/2019 1800   PROTEINUR 30 (A) 05/18/2019 1800   UROBILINOGEN 0.2 12/23/2017 1417   NITRITE NEGATIVE 05/18/2019 1800   LEUKOCYTESUR LARGE (A) 05/18/2019 1800   Sepsis Labs: '@LABRCNTIP' (procalcitonin:4,lacticidven:4) ) Recent Results (from the past 240 hour(s))  SARS Coronavirus 2 (CEPHEID - Performed in [place] hospital lab), Hosp Order     Status: None   Collection Time: 05/18/19  5:26 PM   Specimen: Nasopharyngeal Swab  Result Value Ref Range Status   SARS Coronavirus 2 NEGATIVE NEGATIVE Final    Comment: (NOTE) If result is NEGATIVE SARS-CoV-2 target nucleic acids are NOT DETECTED. The SARS-CoV-2 RNA is generally detectable in upper and lower  respiratory specimens during the acute phase of infection. The lowest  concentration of SARS-CoV-2  viral copies this assay can detect is 250  copies / mL. A negative result does not preclude SARS-CoV-2 infection  and should not be used as the sole basis for treatment or other  patient management decisions.  A negative result may occur with  improper specimen collection / handling, submission of specimen other  than nasopharyngeal swab, presence of viral mutation(s) within the  areas targeted by this assay, and inadequate number of viral copies  (<250 copies / mL). A negative result must be combined with clinical  observations, patient history, and epidemiological information. If result is POSITIVE SARS-CoV-2 target nucleic acids are DETECTED. The SARS-CoV-2 RNA is generally detectable in upper and lower  respiratory specimens dur ing the acute phase of infection.  Positive  results are indicative of active infection with SARS-CoV-2.  Clinical  correlation with patient history and other diagnostic information is  necessary to determine patient infection status.  Positive results do  not rule out bacterial infection or co-infection with other viruses. If result is PRESUMPTIVE POSTIVE SARS-CoV-2 nucleic acids MAY BE PRESENT.   A presumptive positive result was obtained on the submitted specimen  and confirmed on repeat testing.  While 2019 novel coronavirus  (SARS-CoV-2) nucleic acids may be present in the submitted sample  additional confirmatory testing may be necessary for epidemiological  and / or clinical management purposes  to differentiate between  SARS-CoV-2 and other Sarbecovirus currently known to infect humans.  If clinically indicated additional testing with an alternate test  methodology [phone]) is advised. The SARS-CoV-2 RNA is generally  detectable in upper and lower respiratory sp ecimens during the acute  phase of infection. The expected result is Negative. Fact Sheet for Patients:  StrictlyIdeas.no Fact Sheet for Healthcare  Providers: BankingDealers.co.za This test is not yet approved or cleared by the [place] FDA and has been authorized for detection and/or diagnosis of SARS-CoV-2 by FDA under an Emergency Use Authorization (EUA).  This EUA will remain in effect (meaning this test can be used) for the duration of the COVID-19 declaration under Section 564(b)(1) of the Act, 21 U.S.C. section 360bbb-3(b)(1), unless the authorization is terminated or revoked sooner. Performed at [facility], [place] [address] [place], [place] [address]      Radiological Exams on Admission: Ct Head Wo Contrast  Result Date: 05/18/2019 CLINICAL DATA:  Altered level of consciousness EXAM: CT HEAD WITHOUT CONTRAST TECHNIQUE: Contiguous axial images were obtained from the base of the skull through the  vertex without intravenous contrast. COMPARISON:  11/03/2009 FINDINGS: Brain: No acute intracranial abnormality. Specifically, no hemorrhage, hydrocephalus, mass lesion, acute infarction, or significant intracranial injury. Vascular: No hyperdense vessel or unexpected calcification. Skull: No acute calvarial abnormality. Sinuses/Orbits: Visualized paranasal sinuses and mastoids clear. Orbital soft tissues unremarkable. Other: None IMPRESSION: No acute intracranial abnormality. Electronically Signed   By: Rolm Baptise M.D.   On: 05/18/2019 17:37    EKG: Independently reviewed.  Normal sinus rhythm.  Assessment/Plan Principal Problem:   Acute encephalopathy Active Problems:   Alcohol intoxication ([place])   Rectal cancer ([place])   Hypertension   Acute lower UTI   Polysubstance abuse ([place])    1. Acute encephalopathy likely related to seizures -after admission patient started having tonic-clonic seizure lasting for around 2 minutes stop without any intervention at that time patient also was found to be hypoglycemic blood sugar of 68.  Patient started having again focal right upper extremity movement for  which I consulted neurologist Dr. Cheral Marker who came and saw the patient.  After giving D50 patient's mental status improved.  Ativan was given 2 mg.  At this time patient's encephalopathic state could be postictal and reason for seizure could be multiple provoked by drug abuse alcohol UTI and hypoglycemia.  We will check CBGs closely and start D5 after given thiamine.  Patient also has confused state will check thiamine level before given thiamine.  Check EEG MRI brain.  No antiseizure medication for now as per the neurologist. 2. Hypoglycemic episode -on D5 after starting thiamine.  Follow CBGs closely.  Will need nutrition consult once patient is more alert awake. 3. Elevated blood pressure on PRN IV hydralazine for now. 4. UTI on ceftriaxone follow cultures. 5. Polysubstance abuse including cocaine and alcohol closely monitor for any withdrawal. 6. Possible Wernicke's encephalopathy on thiamine 5 mg IV every 8 for 3 days followed by patient will require daily.  Follow thiamine level which has been sent. 7. History of rectal cancer will need further history once patient is more alert awake. 8. Acute renal failure likely from dehydration.  Follow metabolic panel after hydration. 9. Anemia appears to be chronic.  Follow CBC check anemia panel given the mental status changes. 10.    DVT prophylaxis: SCDs. Code Status: Full code. Family Communication: Unable to reach family. Disposition Plan: To be determined. Consults called: Neurology and social worker. Admission status: Observation.   Rise Patience MD [facility] Pager [phone].  If 7PM-7AM, please contact night-coverage [email or web address] Password [facility]  05/18/2019, 11:21 PM

## 2019-05-18 NOTE — ED Provider Notes (Signed)
[facility] EMERGENCY DEPARTMENT Provider Note   CSN: [id number] Arrival date & time: 05/18/19  1652     History   Chief Complaint Chief Complaint  Patient presents with  . Altered Mental Status    HPI Sherry Baker is a 53 y.o. female who arrives via EMS for altered mental status.She  has a past medical history of Alcohol abuse, Allergy, Arthritis, Bipolar 1 disorder ([place]), Cancer ([place]) (01/21/2017), Cirrhosis of liver ([place]), Depression, Genetic testing (03/24/2017), and Hypertension.   The patient has a known polysubstance and alcohol abuse issue.  Patient was drinking with other people at [place] at [place] . She was drinking with others which is common and left for a time, when she returned she was confused and intermittently sleepy. EMS was called and the patient refused transport. Police arrived and noticed that the patient seemed bizarre and confused. EMS returned again and brought the patient in for evaluation. The patient cannot give history and there is a level 5 caveat.     HPI  Past Medical History:  Diagnosis Date  . Alcohol abuse   . Allergy    PCNS swelling  . Arthritis   . Bipolar 1 disorder ([place])   . Cancer ([place]) 01/21/2017   rectal cancer  . Cirrhosis of liver ([place])   . Depression   . Genetic testing 03/24/2017   Ms. Beshears underwent genetic counseling and testing for hereditary cancer syndromes on 02/17/2017. Her results were negative for mutations in all 46 genes analyzed by [facility]'s 46-gene Common Hereditary Cancers Panel. Genes analyzed include: APC, ATM, AXIN2, BARD1, BMPR1A, BRCA1, BRCA2, BRIP1, CDH1, CDKN2A, CHEK2, CTNNA1, DICER1, EPCAM, GREM1, HOXB13, KIT, MEN1, MLH1, MSH2, MSH3, MSH6, MUTYH, NBN,  . Hypertension     Patient Active Problem List   Diagnosis Date Noted  . Wound dehiscence 07/10/2017  . Genetic testing 03/24/2017  . Hypertension 02/18/2017  . GERD (gastroesophageal reflux disease) 02/18/2017   . Rectal cancer ([place]) 01/27/2017  . Abdominal pain, epigastric   . Rectal mass   . Fever   . Anemia 01/16/2017  . Abdominal pain   . Constipation   . Alcohol abuse   . Tobacco abuse   . Cocaine abuse ([place])   . Bipolar affective disorder ([place])   . Protein-calorie malnutrition, severe ([place]) 02/13/2014  . Suicide ideation 02/12/2014  . Suicidal ideation 02/11/2014  . Alcohol intoxication ([place]) 02/11/2014  . Transaminitis 02/11/2014  . Substance abuse ([place]) 02/11/2014  . Depression 02/11/2014  . Thrombocytopenia ([place]) 02/11/2014    Past Surgical History:  Procedure Laterality Date  . ABDOMINAL PERINEAL BOWEL RESECTION N/A 06/18/2017   Procedure: ABDOMINAL PERINEAL RESECTION ERAS PATHWAY;  Surgeon: Leighton Ruff, MD;  Location[facility];  Service: General;  Laterality: N/A;  . COLONOSCOPY WITH PROPOFOL Left 01/21/2017   Procedure: COLONOSCOPY WITH PROPOFOL;  Surgeon: Otis Brace, MD;  Location: [facility];  Service: Gastroenterology;  Laterality: Left;  . FRACTURE SURGERY    . MANDIBLE FRACTURE SURGERY       OB History   No obstetric history on file.      Home Medications    Prior to Admission medications   Medication Sig Start Date End Date Taking? Authorizing Provider  celecoxib (CELEBREX) 50 MG capsule Take 1 capsule (50 mg total) by mouth 2 (two) times daily. Patient not taking: Reported on 12/18/2018 06/24/18   Augusto Gamble B, NP  metroNIDAZOLE (FLAGYL) 500 MG tablet Take 1 tablet (500 mg total)  by mouth 2 (two) times daily. Patient not taking: Reported on 12/18/2018 08/16/18   Raylene Everts, MD  Misc. Devices [facility]) MISC 1 standard walker for PRN ambulation use Patient not taking: Reported on 12/18/2018 06/24/18   Zigmund Gottron, NP  Multiple Vitamin (MULTIVITAMIN WITH MINERALS) TABS tablet Take 1 tablet by mouth daily. Patient not taking: Reported on 12/18/2018 2/83/66   Leighton Ruff, MD  naproxen sodium (ANAPROX) 220 MG tablet Take 660 mg by mouth 2  (two) times daily as needed (pain).     [provider]  nitrofurantoin, macrocrystal-monohydrate, (MACROBID) 100 MG capsule Take 1 capsule (100 mg total) by mouth 2 (two) times daily. Patient not taking: Reported on 12/18/2018 08/10/18   Robyn Haber, MD  ziprasidone (GEODON) 40 MG capsule Take 1 capsule (40 mg total) by mouth 2 (two) times daily with a meal. 11/06/11 01/09/12  Daleen Bo, MD    Family History Family History  Problem Relation Age of Onset  . Cancer Mother 35       Originating in the abdomen, otherwise unknwon  . Cancer - Other Maternal Aunt        Throat    Social History Social History   Tobacco Use  . Smoking status: Current Every Day Smoker    Packs/day: 0.50    Years: 39.00    Pack years: 19.50    Types: Cigarettes  . Smokeless tobacco: Never Used  Substance Use Topics  . Alcohol use: Yes    Comment: 07/28/2017 "40oz beer/day; if I have it"  . Drug use: No    Types: "Crack" cocaine    Comment: 07/28/2017 "nothing in 3 wks"     Allergies   Penicillins   Review of Systems Review of Systems Unable to review systems   Physical Exam Updated Vital Signs BP (!) 176/98   Pulse 89   Temp (!) 97.4 F (36.3 C) (Oral)   Resp 18   SpO2 94%   Physical Exam Vitals signs and nursing note reviewed.  Constitutional:      Comments: Appears older than stated age.   HENT:     Head: Normocephalic and atraumatic.     Mouth/Throat:     Mouth: Mucous membranes are moist.     Comments: Abnormal dentition Eyes:     Comments: Disconjugate gaze  Neck:     Musculoskeletal: Neck supple.  Cardiovascular:     Rate and Rhythm: Normal rate.  Pulmonary:     Effort: Pulmonary effort is normal. No respiratory distress.     Breath sounds: Normal breath sounds. No wheezing.  Abdominal:     General: There is distension.     Comments: Colostomy bag in place  Skin:    General: Skin is warm and dry.  Neurological:     General: No focal deficit present.      Mental Status: She is easily aroused. She is lethargic and disoriented.     GCS: GCS eye subscore is 3. GCS verbal subscore is 3. GCS motor subscore is 5.      ED Treatments / Results  Labs (all labs ordered are listed, but only abnormal results are displayed) Labs Reviewed  COMPREHENSIVE METABOLIC PANEL - Abnormal; Notable for the following components:      Result Value   CO2 15 (*)    BUN 28 (*)    Creatinine, Ser 1.97 (*)    Total Protein 8.4 (*)    GFR calc non Af Amer 29 (*)  GFR calc Af Amer 33 (*)    All other components within normal limits  ETHANOL - Abnormal; Notable for the following components:   Alcohol, Ethyl (B) 84 (*)    All other components within normal limits  CBC WITH DIFFERENTIAL/PLATELET - Abnormal; Notable for the following components:   RBC 3.81 (*)    Hemoglobin 10.8 (*)    HCT 34.2 (*)    RDW 17.9 (*)    Lymphs Abs 0.6 (*)    All other components within normal limits  RAPID URINE DRUG SCREEN, HOSP PERFORMED - Abnormal; Notable for the following components:   Cocaine POSITIVE (*)    All other components within normal limits  CK - Abnormal; Notable for the following components:   Total CK 312 (*)    All other components within normal limits  URINALYSIS, COMPLETE (UACMP) WITH MICROSCOPIC - Abnormal; Notable for the following components:   APPearance HAZY (*)    Hgb urine dipstick SMALL (*)    Protein, ur 30 (*)    Leukocytes,Ua LARGE (*)    Bacteria, UA FEW (*)    All other components within normal limits  LACTIC ACID, PLASMA - Abnormal; Notable for the following components:   Lactic Acid, Venous 2.3 (*)    All other components within normal limits  ACETAMINOPHEN LEVEL - Abnormal; Notable for the following components:   Acetaminophen (Tylenol), Serum <10 (*)    All other components within normal limits  SARS CORONAVIRUS 2 (HOSPITAL ORDER, [facility])  CULTURE, BLOOD (ROUTINE X 2)  CULTURE, BLOOD (ROUTINE X 2)   URINE CULTURE  AMMONIA  SALICYLATE LEVEL  VITAMIN B1  CBG MONITORING, ED  I-STAT CHEM 8, ED  I-STAT BETA HCG BLOOD, ED ([facility], [facility], [facility] ONLY)  CBG MONITORING, ED    EKG EKG Interpretation  Date/Time:  Wednesday May 18 2019 16:52:50 EDT Ventricular Rate:  90 PR Interval:    QRS Duration: 102 QT Interval:  380 QTC Calculation: 465 R Axis:   79 Text Interpretation:  Sinus rhythm LAE, consider biatrial enlargement Left ventricular hypertrophy Nonspecific T abnormalities, lateral leads No significant change since last tracing Confirmed by Wandra Arthurs [phone]) on 05/18/2019 4:54:27 PM   Radiology Ct Head Wo Contrast  Result Date: 05/18/2019 CLINICAL DATA:  Altered level of consciousness EXAM: CT HEAD WITHOUT CONTRAST TECHNIQUE: Contiguous axial images were obtained from the base of the skull through the vertex without intravenous contrast. COMPARISON:  11/03/2009 FINDINGS: Brain: No acute intracranial abnormality. Specifically, no hemorrhage, hydrocephalus, mass lesion, acute infarction, or significant intracranial injury. Vascular: No hyperdense vessel or unexpected calcification. Skull: No acute calvarial abnormality. Sinuses/Orbits: Visualized paranasal sinuses and mastoids clear. Orbital soft tissues unremarkable. Other: None IMPRESSION: No acute intracranial abnormality. Electronically Signed   By: Rolm Baptise M.D.   On: 05/18/2019 17:37    Procedures Procedures (including critical care time)  Medications Ordered in ED Medications  sodium chloride 0.9 % bolus 1,000 mL (1,000 mLs Intravenous New Bag/Given 05/18/19 1801)    And  0.9 %  sodium chloride infusion ( Intravenous New Bag/Given 05/18/19 1801)  cefTRIAXone (ROCEPHIN) 1 g in sodium chloride 0.9 % 100 mL IVPB (has no administration in time range)  hydrALAZINE (APRESOLINE) injection 5 mg (has no administration in time range)     Initial Impression / Assessment and Plan / ED Course  I have reviewed the triage vital signs and  the nursing notes.  Pertinent labs & imaging results that were available  during my care of the patient were reviewed by me and considered in my medical decision making (see chart for details).  Clinical Course as of May 17 2012  Wed May 18, 2019  1940 COCAINE(!): POSITIVE [AH]  1941 CK Total(!): 312 [AH]  2003 Urinalysis, Complete w Microscopic(!) [AH]    Clinical Course User Index [AH] Margarita Mail, PA-C       CC:AMS VS: .BP (!) 174/102   Pulse 82   Temp (!) 97.4 F (36.3 C) (Oral)   Resp (!) 28   SpO2 100%   BU:LAGTXMI is gathered by Patient  and Emr. DDX:The differential diagnosis for AMS is extensive and includes, but is not limited to: drug overdose - opioids, alcohol, sedatives, antipsychotics, drug withdrawal, others; Metabolic: hypoxia, hypoglycemia, hyperglycemia, hypercalcemia, hypernatremia, hyponatremia, uremia, hepatic encephalopathy, hypothyroidism, hyperthyroidism, vitamin B12 or thiamine deficiency, carbon monoxide poisoning, Wilson's disease, Lactic acidosis, DKA/HHOS; Infectious: meningitis, encephalitis, bacteremia/sepsis, urinary tract infection, pneumonia, neurosyphilis; Structural: Space-occupying lesion, (brain tumor, subdural hematoma, hydrocephalus,); Vascular: stroke, subarachnoid hemorrhage, coronary ischemia, hypertensive encephalopathy, CNS vasculitis, thrombotic thrombocytopenic purpura, disseminated intravascular coagulation, hyperviscosity; Psychiatric: Schizophrenia, depression; Other: Seizure, hypothermia, heat stroke, ICU psychosis, dementia -"sundowning." Labs: I reviewed the labs which show UDS positive for cocaine, lactic acid slightly elevated likely due to dehydration, coronavirus test is negative.  Alcohol level is elevated at 84, negative Tylenol and salicylate level.  CK slightly elevated also likely due to heat injury and dehydration.  CBC shows mild lymphopenia, normocytic anemia, CMP shows stable but mild renal insufficiency without other  significant abnormality.  Patient's urine appears infected.  CBG of 80. Imaging: I personally reviewed the images (cxr, ct head) which show(s) No acute abnormalities EKG:.  EKG Interpretation  Date/Time:  Wednesday May 18 2019 16:58:54 EDT Ventricular Rate:  87 PR Interval:    QRS Duration: 99 QT Interval:  390 QTC Calculation: 470 R Axis:   79 Text Interpretation:  Sinus rhythm Probable left atrial enlargement Left ventricular hypertrophy Nonspecific T abnormalities, lateral leads When compared with ECG of EARLIER SAME DATE No significant change was found Confirmed by Delora Fuel ([id number]) on 05/18/2019 11:15:23 PM      ZYY:QMGNOIB here with encephalopathy- multifactorial. Could be secondary to her hypertension.Uti or cocaine abuse.May also be secondary to Wernicke's.  Her ammonia level is normal so I doubt hepatic cause. Patient treated with fluids. She has been somnolent but protecting her airway. Patient will be admitted t to [facility] service . I have discussed the case with D.r Hal Hope. Patient disposition:Admit Patient condition: stable. The patient appears reasonably stabilized for admission considering the current resources, flow, and capabilities available in the ED at this time, and I doubt any other [facility] requiring further screening and/or treatment in the ED prior to admission.   Final Clinical Impressions(s) / ED Diagnoses   Final diagnoses:  None    ED Discharge Orders    None       Margarita Mail, PA-C 05/19/19 0025    Drenda Freeze, MD 05/27/19 1253

## 2019-05-19 ENCOUNTER — Inpatient Hospital Stay (HOSPITAL_COMMUNITY): Payer: Medicaid Other

## 2019-05-19 ENCOUNTER — Inpatient Hospital Stay: Payer: Medicaid Other | Admitting: Oncology

## 2019-05-19 ENCOUNTER — Observation Stay (HOSPITAL_COMMUNITY): Payer: Medicaid Other

## 2019-05-19 DIAGNOSIS — C20 Malignant neoplasm of rectum: Secondary | ICD-10-CM | POA: Diagnosis present

## 2019-05-19 DIAGNOSIS — F10129 Alcohol abuse with intoxication, unspecified: Secondary | ICD-10-CM | POA: Diagnosis present

## 2019-05-19 DIAGNOSIS — R4182 Altered mental status, unspecified: Secondary | ICD-10-CM | POA: Diagnosis not present

## 2019-05-19 DIAGNOSIS — D649 Anemia, unspecified: Secondary | ICD-10-CM | POA: Diagnosis present

## 2019-05-19 DIAGNOSIS — K7460 Unspecified cirrhosis of liver: Secondary | ICD-10-CM | POA: Diagnosis present

## 2019-05-19 DIAGNOSIS — N390 Urinary tract infection, site not specified: Secondary | ICD-10-CM

## 2019-05-19 DIAGNOSIS — E162 Hypoglycemia, unspecified: Secondary | ICD-10-CM | POA: Diagnosis present

## 2019-05-19 DIAGNOSIS — E512 Wernicke's encephalopathy: Secondary | ICD-10-CM | POA: Diagnosis present

## 2019-05-19 DIAGNOSIS — I10 Essential (primary) hypertension: Secondary | ICD-10-CM | POA: Diagnosis not present

## 2019-05-19 DIAGNOSIS — Z03818 Encounter for observation for suspected exposure to other biological agents ruled out: Secondary | ICD-10-CM | POA: Diagnosis not present

## 2019-05-19 DIAGNOSIS — E860 Dehydration: Secondary | ICD-10-CM | POA: Diagnosis present

## 2019-05-19 DIAGNOSIS — R569 Unspecified convulsions: Secondary | ICD-10-CM | POA: Diagnosis not present

## 2019-05-19 DIAGNOSIS — E876 Hypokalemia: Secondary | ICD-10-CM | POA: Diagnosis present

## 2019-05-19 DIAGNOSIS — R404 Transient alteration of awareness: Secondary | ICD-10-CM | POA: Diagnosis not present

## 2019-05-19 DIAGNOSIS — T405X1A Poisoning by cocaine, accidental (unintentional), initial encounter: Secondary | ICD-10-CM | POA: Diagnosis present

## 2019-05-19 DIAGNOSIS — G4089 Other seizures: Secondary | ICD-10-CM | POA: Diagnosis present

## 2019-05-19 DIAGNOSIS — I129 Hypertensive chronic kidney disease with stage 1 through stage 4 chronic kidney disease, or unspecified chronic kidney disease: Secondary | ICD-10-CM | POA: Diagnosis present

## 2019-05-19 DIAGNOSIS — F17210 Nicotine dependence, cigarettes, uncomplicated: Secondary | ICD-10-CM | POA: Diagnosis present

## 2019-05-19 DIAGNOSIS — G9340 Encephalopathy, unspecified: Secondary | ICD-10-CM

## 2019-05-19 DIAGNOSIS — Z1159 Encounter for screening for other viral diseases: Secondary | ICD-10-CM | POA: Diagnosis not present

## 2019-05-19 DIAGNOSIS — Z79899 Other long term (current) drug therapy: Secondary | ICD-10-CM | POA: Diagnosis not present

## 2019-05-19 DIAGNOSIS — F1410 Cocaine abuse, uncomplicated: Secondary | ICD-10-CM | POA: Diagnosis present

## 2019-05-19 DIAGNOSIS — N183 Chronic kidney disease, stage 3 (moderate): Secondary | ICD-10-CM | POA: Diagnosis present

## 2019-05-19 DIAGNOSIS — G92 Toxic encephalopathy: Secondary | ICD-10-CM | POA: Diagnosis present

## 2019-05-19 DIAGNOSIS — N179 Acute kidney failure, unspecified: Secondary | ICD-10-CM | POA: Diagnosis present

## 2019-05-19 LAB — CBC WITH DIFFERENTIAL/PLATELET
Abs Immature Granulocytes: 0.03 10*3/uL (ref 0.00–0.07)
Basophils Absolute: 0 10*3/uL (ref 0.0–0.1)
Basophils Relative: 0 %
Eosinophils Absolute: 0.1 10*3/uL (ref 0.0–0.5)
Eosinophils Relative: 1 %
HCT: 35.7 % — ABNORMAL LOW (ref 36.0–46.0)
Hemoglobin: 11.4 g/dL — ABNORMAL LOW (ref 12.0–15.0)
Immature Granulocytes: 1 %
Lymphocytes Relative: 10 %
Lymphs Abs: 0.6 10*3/uL — ABNORMAL LOW (ref 0.7–4.0)
MCH: 28.5 pg (ref 26.0–34.0)
MCHC: 31.9 g/dL (ref 30.0–36.0)
MCV: 89.3 fL (ref 80.0–100.0)
Monocytes Absolute: 0.6 10*3/uL (ref 0.1–1.0)
Monocytes Relative: 11 %
Neutro Abs: 4.5 10*3/uL (ref 1.7–7.7)
Neutrophils Relative %: 77 %
Platelets: 194 10*3/uL (ref 150–400)
RBC: 4 MIL/uL (ref 3.87–5.11)
RDW: 17.7 % — ABNORMAL HIGH (ref 11.5–15.5)
WBC: 5.8 10*3/uL (ref 4.0–10.5)
nRBC: 0 % (ref 0.0–0.2)

## 2019-05-19 LAB — RETICULOCYTES
Immature Retic Fract: 19.3 % — ABNORMAL HIGH (ref 2.3–15.9)
RBC.: 3.78 MIL/uL — ABNORMAL LOW (ref 3.87–5.11)
Retic Count, Absolute: 74.8 10*3/uL (ref 19.0–186.0)
Retic Ct Pct: 2 % (ref 0.4–3.1)

## 2019-05-19 LAB — POCT I-STAT, CHEM 8
BUN: 29 mg/dL — ABNORMAL HIGH (ref 6–20)
Calcium, Ion: 1.15 mmol/L (ref 1.15–1.40)
Chloride: 113 mmol/L — ABNORMAL HIGH (ref 98–111)
Creatinine, Ser: 2.1 mg/dL — ABNORMAL HIGH (ref 0.44–1.00)
Glucose, Bld: 76 mg/dL (ref 70–99)
HCT: 37 % (ref 36.0–46.0)
Hemoglobin: 12.6 g/dL (ref 12.0–15.0)
Potassium: 3.6 mmol/L (ref 3.5–5.1)
Sodium: 140 mmol/L (ref 135–145)
TCO2: 16 mmol/L — ABNORMAL LOW (ref 22–32)

## 2019-05-19 LAB — HIV ANTIBODY (ROUTINE TESTING W REFLEX): HIV Screen 4th Generation wRfx: NONREACTIVE

## 2019-05-19 LAB — BASIC METABOLIC PANEL
Anion gap: 16 — ABNORMAL HIGH (ref 5–15)
BUN: 24 mg/dL — ABNORMAL HIGH (ref 6–20)
CO2: 14 mmol/L — ABNORMAL LOW (ref 22–32)
Calcium: 8.9 mg/dL (ref 8.9–10.3)
Chloride: 114 mmol/L — ABNORMAL HIGH (ref 98–111)
Creatinine, Ser: 1.56 mg/dL — ABNORMAL HIGH (ref 0.44–1.00)
GFR calc Af Amer: 44 mL/min — ABNORMAL LOW (ref >60)
GFR calc non Af Amer: 38 mL/min — ABNORMAL LOW (ref >60)
Glucose, Bld: 71 mg/dL (ref 70–99)
Potassium: 3.6 mmol/L (ref 3.5–5.1)
Sodium: 144 mmol/L (ref 135–145)

## 2019-05-19 LAB — IRON AND TIBC
Iron: 147 ug/dL (ref 28–170)
Saturation Ratios: 37 % — ABNORMAL HIGH (ref 10.4–31.8)
TIBC: 398 ug/dL (ref 250–450)
UIBC: 251 ug/dL

## 2019-05-19 LAB — HEPATIC FUNCTION PANEL
ALT: 18 U/L (ref 0–44)
AST: 27 U/L (ref 15–41)
Albumin: 3.4 g/dL — ABNORMAL LOW (ref 3.5–5.0)
Alkaline Phosphatase: 69 U/L (ref 38–126)
Bilirubin, Direct: 0.1 mg/dL (ref 0.0–0.2)
Indirect Bilirubin: 0.4 mg/dL (ref 0.3–0.9)
Total Bilirubin: 0.5 mg/dL (ref 0.3–1.2)
Total Protein: 7.7 g/dL (ref 6.5–8.1)

## 2019-05-19 LAB — GLUCOSE, CAPILLARY
Glucose-Capillary: 68 mg/dL — ABNORMAL LOW (ref 70–99)
Glucose-Capillary: 165 mg/dL — ABNORMAL HIGH (ref 70–99)
Glucose-Capillary: 69 mg/dL — ABNORMAL LOW (ref 70–99)
Glucose-Capillary: 168 mg/dL — ABNORMAL HIGH (ref 70–99)
Glucose-Capillary: 61 mg/dL — ABNORMAL LOW (ref 70–99)
Glucose-Capillary: 64 mg/dL — ABNORMAL LOW (ref 70–99)
Glucose-Capillary: 144 mg/dL — ABNORMAL HIGH (ref 70–99)
Glucose-Capillary: 106 mg/dL — ABNORMAL HIGH (ref 70–99)
Glucose-Capillary: 80 mg/dL (ref 70–99)
Glucose-Capillary: 79 mg/dL (ref 70–99)

## 2019-05-19 LAB — FERRITIN: Ferritin: 27 ng/mL (ref 11–307)

## 2019-05-19 LAB — LACTIC ACID, PLASMA: Lactic Acid, Venous: 0.8 mmol/L (ref 0.5–1.9)

## 2019-05-19 LAB — MAGNESIUM: Magnesium: 2.2 mg/dL (ref 1.7–2.4)

## 2019-05-19 LAB — I-STAT BETA HCG BLOOD, ED (NOT ORDERABLE): I-stat hCG, quantitative: 6.5 m[IU]/mL — ABNORMAL HIGH (ref <5)

## 2019-05-19 LAB — VITAMIN B12: Vitamin B-12: 160 pg/mL — ABNORMAL LOW (ref 180–914)

## 2019-05-19 LAB — FOLATE: Folate: 13.8 ng/mL (ref >5.9)

## 2019-05-19 MED ORDER — DEXTROSE 50 % IV SOLN
25.0000 mL | Freq: Once | INTRAVENOUS | Status: AC
  Administered 2019-05-19: 25 mL via INTRAVENOUS

## 2019-05-19 MED ORDER — DEXTROSE-NACL 5-0.9 % IV SOLN
INTRAVENOUS | Status: DC
  Administered 2019-05-19 – 2019-05-20 (×3): via INTRAVENOUS

## 2019-05-19 MED ORDER — DEXTROSE 50 % IV SOLN
INTRAVENOUS | Status: AC
  Filled 2019-05-19: qty 50

## 2019-05-19 MED ORDER — THIAMINE HCL 100 MG/ML IJ SOLN
500.0000 mg | Freq: Three times a day (TID) | INTRAVENOUS | Status: DC
  Administered 2019-05-19 – 2019-05-20 (×6): 500 mg via INTRAVENOUS
  Filled 2019-05-19 (×10): qty 5

## 2019-05-19 MED ORDER — DEXTROSE-NACL 5-0.9 % IV SOLN
INTRAVENOUS | Status: DC
  Administered 2019-05-19: 05:00:00 via INTRAVENOUS

## 2019-05-19 MED ORDER — LEVETIRACETAM IN NACL 1000 MG/100ML IV SOLN
1000.0000 mg | Freq: Once | INTRAVENOUS | Status: DC
  Filled 2019-05-19: qty 100

## 2019-05-19 MED ORDER — DEXTROSE 50 % IV SOLN
12.5000 g | INTRAVENOUS | Status: AC
  Administered 2019-05-19: 12.5 g via INTRAVENOUS
  Filled 2019-05-19: qty 50

## 2019-05-19 MED ORDER — ACETAMINOPHEN 650 MG RE SUPP: 650.0000 mg | Freq: Four times a day (QID) | RECTAL | Status: DC | PRN

## 2019-05-19 MED ORDER — LORAZEPAM 2 MG/ML IJ SOLN
INTRAMUSCULAR | Status: AC
  Filled 2019-05-19: qty 1

## 2019-05-19 MED ORDER — ONDANSETRON HCL 4 MG/2ML IJ SOLN: 4.0000 mg | Freq: Four times a day (QID) | INTRAMUSCULAR | Status: DC | PRN

## 2019-05-19 MED ORDER — ACETAMINOPHEN 325 MG PO TABS
650.0000 mg | ORAL_TABLET | Freq: Four times a day (QID) | ORAL | Status: DC | PRN
  Administered 2019-05-19 – 2019-05-21 (×2): 650 mg via ORAL
  Filled 2019-05-19 (×2): qty 2

## 2019-05-19 MED ORDER — DEXTROSE 50 % IV SOLN
12.5000 g | INTRAVENOUS | Status: AC
  Administered 2019-05-19: 12.5 g via INTRAVENOUS

## 2019-05-19 MED ORDER — THIAMINE HCL 100 MG/ML IJ SOLN
100.0000 mg | Freq: Every day | INTRAMUSCULAR | Status: DC
  Administered 2019-05-19: 100 mg via INTRAVENOUS

## 2019-05-19 MED ORDER — LEVETIRACETAM IN NACL 500 MG/100ML IV SOLN: 500.0000 mg | Freq: Two times a day (BID) | INTRAVENOUS | Status: DC

## 2019-05-19 MED ORDER — ONDANSETRON HCL 4 MG PO TABS: 4.0000 mg | ORAL_TABLET | Freq: Four times a day (QID) | ORAL | Status: DC | PRN

## 2019-05-19 MED ORDER — LORAZEPAM 2 MG/ML IJ SOLN
2.0000 mg | Freq: Once | INTRAMUSCULAR | Status: AC
  Administered 2019-05-19: 2 mg via INTRAVENOUS
  Filled 2019-05-19: qty 1

## 2019-05-19 MED ORDER — SODIUM CHLORIDE 0.9 % IV SOLN
1.0000 g | INTRAVENOUS | Status: DC
  Administered 2019-05-19: 1 g via INTRAVENOUS
  Filled 2019-05-19: qty 10

## 2019-05-19 NOTE — Consult Note (Signed)
NEURO HOSPITALIST CONSULT NOTE   Requestig physician: Dr. Hal Hope  Reason for Consult: Seizure  History obtained from:   Chart     HPI:                                                                                                                                          Sherry Baker is an 53 y.o. female who presented to [facility] for AMS noted by her companions while drinking alcohol. On initial assessment by Dr. Hal Hope, she exhibited abnormal responses to external stimuli, including staring directly ahead after attempts were made to communicate. After she was admitted, she was noted to have upper extremity shaking, following which she appeared to be less responsive. CBG at that time was 68. Her mentation has improved following D5 infusion.   She nods her head yes when asked if she has a history of seizures. She also nods yes when asked if she was ever taking an anticonvulsant; however, she will not verbalize the name of the medication or when exactly she last took it, only nodding yes to "months or years ago?".  Past Medical History:  Diagnosis Date  . Alcohol abuse   . Allergy    PCNS swelling  . Arthritis   . Bipolar 1 disorder ([place])   . Cancer ([place]) 01/21/2017   rectal cancer  . Cirrhosis of liver ([place])   . Depression   . Genetic testing 03/24/2017   Ms. Kong underwent genetic counseling and testing for hereditary cancer syndromes on 02/17/2017. Her results were negative for mutations in all 46 genes analyzed by [facility]'s 46-gene Common Hereditary Cancers Panel. Genes analyzed include: APC, ATM, AXIN2, BARD1, BMPR1A, BRCA1, BRCA2, BRIP1, CDH1, CDKN2A, CHEK2, CTNNA1, DICER1, EPCAM, GREM1, HOXB13, KIT, MEN1, MLH1, MSH2, MSH3, MSH6, MUTYH, NBN,  . Hypertension     Past Surgical History:  Procedure Laterality Date  . ABDOMINAL PERINEAL BOWEL RESECTION N/A 06/18/2017   Procedure: ABDOMINAL PERINEAL RESECTION ERAS PATHWAY;  Surgeon: Leighton Ruff, MD;   Location[facility];  Service: General;  Laterality: N/A;  . COLONOSCOPY WITH PROPOFOL Left 01/21/2017   Procedure: COLONOSCOPY WITH PROPOFOL;  Surgeon: Otis Brace, MD;  Location: [facility];  Service: Gastroenterology;  Laterality: Left;  . FRACTURE SURGERY    . MANDIBLE FRACTURE SURGERY      Family History  Problem Relation Age of Onset  . Cancer Mother 35       Originating in the abdomen, otherwise unknwon  . Cancer - Other Maternal Aunt        Throat              Social History:  reports that she has been smoking cigarettes. She has a 19.50 pack-year smoking history. She has never used smokeless tobacco. She reports current alcohol  use. She reports that she does not use drugs.  Allergies  Allergen Reactions  . Penicillins Hives and Swelling    Has patient had a PCN reaction causing immediate rash, facial/tongue/throat swelling, SOB or lightheadedness with hypotension: Yes Has patient had a PCN reaction causing severe rash involving mucus membranes or skin necrosis: No Has patient had a PCN reaction that required hospitalization No Has patient had a PCN reaction occurring within the last 10 years: Yes If all of the above answers are "NO", then may proceed with Cephalosporin use.     HOME MEDICATIONS:                                                                                                                     Celebrex Flagyl MVI Naproxen Nitrofurantoin   ROS:                                                                                                                                       The patient is not responding verbally to questions. Unable to obtain ROS   Blood pressure (!) 193/81, pulse (!) 121, temperature 97.9 F (36.6 C), temperature source Oral, resp. rate (!) 21, SpO2 99 %.   General Examination:                                                                                                       Physical Exam  HEENT-  Old scar to forehead is  prominent. No new evidence for trauma.    Lungs- Respirations unlabored Extremities- No pitting edema. Possible synovial cyst to right wrist noted.   Neurological Examination Mental Status: Awake. Will identify a glove and a thumb. Does not speak except for one word answers, all of which are following a significant delay. Her minimal speech output is non-dysarthric. She follows simple motor commands.  Cranial Nerves: II: Not compliant with visual field testing. PERRL.   III,IV, VI: No ptosis. Will gaze briefly to left and right when asked.  No nystagmus.  V,VII: No facial droop. Not compliant with sensory testing.  VIII: hearing intact to voice IX,X: mild hoarseness noted.  XI: Not compliant with testing XII: midline tongue extension Motor: Poorly cooperative. Will grip examiner's hands and flex at elbows equally with 4/5 strength.  Does not move lower extremities except at ankles initially. With plantar stimulation she briskly withdraws BLE equally and exclaims.  Sensory: Nods head when both arms touched. Shakes head when both legs touched. Brisk withdrawal to plantar stimulation as above.  Deep Tendon Reflexes: Hypoactive/trace brachioradialis and patellar reflexes bilaterally.  Plantars: Right: downgoing   Left: downgoing Cerebellar: Will comply with one FNF trial on left and right, with no gross ataxia noted.  Gait: Deferred   Lab Results: Basic Metabolic Panel: Recent Labs  Lab 05/18/19 1716  NA 138  K 3.5  CL 109  CO2 15*  GLUCOSE 79  BUN 28*  CREATININE 1.97*  CALCIUM 9.2    CBC: Recent Labs  Lab 05/18/19 1716 05/19/19 0249  WBC 5.9 5.8  NEUTROABS 4.7 4.5  HGB 10.8* 11.4*  HCT 34.2* 35.7*  MCV 89.8 89.3  PLT 202 194    Cardiac Enzymes: Recent Labs  Lab 05/18/19 1716  CKTOTAL 312*    Lipid Panel: No results for input(s): CHOL, TRIG, HDL, CHOLHDL, VLDL, LDLCALC in the last 168 hours.  Imaging: Ct Head Wo Contrast  Result Date: 05/18/2019 CLINICAL  DATA:  Altered level of consciousness EXAM: CT HEAD WITHOUT CONTRAST TECHNIQUE: Contiguous axial images were obtained from the base of the skull through the vertex without intravenous contrast. COMPARISON:  11/03/2009 FINDINGS: Brain: No acute intracranial abnormality. Specifically, no hemorrhage, hydrocephalus, mass lesion, acute infarction, or significant intracranial injury. Vascular: No hyperdense vessel or unexpected calcification. Skull: No acute calvarial abnormality. Sinuses/Orbits: Visualized paranasal sinuses and mastoids clear. Orbital soft tissues unremarkable. Other: None IMPRESSION: No acute intracranial abnormality. Electronically Signed   By: Rolm Baptise M.D.   On: 05/18/2019 17:37   Dg Chest Port 1 View  Result Date: 05/18/2019 CLINICAL DATA:  Seizure EXAM: PORTABLE CHEST 1 VIEW COMPARISON:  07/28/2017 FINDINGS: No acute opacity or pleural effusion. Normal cardiomediastinal silhouette. No pneumothorax. Left cervical rib. IMPRESSION: No active disease. Electronically Signed   By: Donavan Foil M.D.   On: 05/18/2019 23:53    Assessment: 53 year old female with probable hypoglycemia induced seizure 1. EtOH and cocaine positive as well, which could affect her seizure threshold.  2. No definite focal findings on exam.  3. Her responses to questions and commands appears more consistent with behaviorally-mediated dysfunction than due to NCSE or postictal state.  4. Exam not strongly suggestive of thiamine deficiency, but given her undernourished appearance and EtOH use, Wernicke's encephalopathy is possible.   Recommendations: 1. Ativan 2 mg IV x 1 2. EEG 3. MRI brain 4. Monitor and correct glucose levels. D5 infusion is recommended provided that she has been treated with IV thiamine. Would start 500 mg IV TID and continue for 3 days, then with 100 mg po qd thereafter.  5. Thiamine level has been ordered.    Electronically signed: Dr. Kerney Elbe 05/19/2019, 3:49 AM

## 2019-05-19 NOTE — Progress Notes (Addendum)
Neurology Progress Note   S:// Seen and examined. Reportedly has had multiple short seizures that consist of head-nodding, followed by limb flailing and immediately falling asleep and waking up with a startle.   O:// Current vital signs: BP 113/62   Pulse 93   Temp 97.9 F (36.6 C) (Oral)   Resp 17   SpO2 96%  Vital signs in last 24 hours: Temp:  [97.4 F (36.3 C)-98.3 F (36.8 C)] 97.9 F (36.6 C) (07/16 0322) Pulse Rate:  [65-121] 93 (07/16 0448) Resp:  [14-31] 17 (07/16 0448) BP: (113-212)/(62-112) 113/62 (07/16 0448) SpO2:  [91 %-100 %] 96 % (07/16 0448) General: No sleeping and woke up startled with voice HEENT: Normocephalic atraumatic CVS: Q4-O9 heard regular rate rhythm Abdomen: Nondistended nontender Neurological exam Patient is awake alert oriented x2 She tells me she has had a history of seizures but cannot tell me how many incidents were Her speech is mildly dysarthric Naming comprehension repetition is intact although she has poor attention concentration Cranial nerves: Pupils equal round react light, extraocular was intact, visual fields full, face symmetric, auditory acuity intact, tongue midline, palate midline. Motor exam: She is antigravity in all 4 extremities without any focal weakness Sensory exam: Intact to light touch all over No evidence of dysmetria on coordination testing  Medications  Current Facility-Administered Medications:  .  acetaminophen (TYLENOL) tablet 650 mg, 650 mg, Oral, Q6H PRN **OR** acetaminophen (TYLENOL) suppository 650 mg, 650 mg, Rectal, Q6H PRN, Rise Patience, MD .  cefTRIAXone (ROCEPHIN) 1 g in sodium chloride 0.9 % 100 mL IVPB, 1 g, Intravenous, Q24H, Kakrakandy, Arshad N, MD .  dextrose 5 %-0.9 % sodium chloride infusion, , Intravenous, Continuous, Rise Patience, MD, Last Rate: 75 mL/hr at 05/19/19 0450 .  dextrose 50 % solution, , , ,  .  hydrALAZINE (APRESOLINE) injection 10 mg, 10 mg, Intravenous, Q4H  PRN, Rise Patience, MD, 10 mg at 05/19/19 0141 .  LORazepam (ATIVAN) 2 MG/ML injection, , , ,  .  ondansetron (ZOFRAN) tablet 4 mg, 4 mg, Oral, Q6H PRN **OR** ondansetron (ZOFRAN) injection 4 mg, 4 mg, Intravenous, Q6H PRN, Rise Patience, MD .  thiamine 500mg  in normal saline (5ml) IVPB, 500 mg, Intravenous, TID, Rise Patience, MD Labs CBC    Component Value Date/Time   WBC 5.8 05/19/2019 0249   RBC 4.00 05/19/2019 0249   HGB 11.4 (L) 05/19/2019 0249   HGB 9.7 (L) 10/07/2017 1335   HCT 35.7 (L) 05/19/2019 0249   HCT 31.5 (L) 10/07/2017 1335   PLT 194 05/19/2019 0249   PLT 343 10/07/2017 1335   MCV 89.3 05/19/2019 0249   MCV 74.2 (L) 10/07/2017 1335   MCH 28.5 05/19/2019 0249   MCHC 31.9 05/19/2019 0249   RDW 17.7 (H) 05/19/2019 0249   RDW 20.2 (H) 10/07/2017 1335   LYMPHSABS 0.6 (L) 05/19/2019 0249   LYMPHSABS 2.2 10/07/2017 1335   MONOABS 0.6 05/19/2019 0249   MONOABS 1.0 (H) 10/07/2017 1335   EOSABS 0.1 05/19/2019 0249   EOSABS 0.2 10/07/2017 1335   BASOSABS 0.0 05/19/2019 0249   BASOSABS 0.0 10/07/2017 1335    CMP     Component Value Date/Time   NA 144 05/19/2019 0249   NA 133 (L) 04/22/2017 1512   K 3.6 05/19/2019 0249   K 3.9 04/22/2017 1512   CL 114 (H) 05/19/2019 0249   CO2 14 (L) 05/19/2019 0249   CO2 24 04/22/2017 1512   GLUCOSE 71 05/19/2019 0249  GLUCOSE 79 04/22/2017 1512   BUN 24 (H) 05/19/2019 0249   BUN 9.5 04/22/2017 1512   CREATININE 1.56 (H) 05/19/2019 0249   CREATININE 0.6 04/22/2017 1512   CALCIUM 8.9 05/19/2019 0249   CALCIUM 9.4 04/22/2017 1512   PROT 7.7 05/19/2019 0249   PROT 7.9 04/22/2017 1512   ALBUMIN 3.4 (L) 05/19/2019 0249   ALBUMIN 3.6 04/22/2017 1512   AST 27 05/19/2019 0249   AST 26 04/22/2017 1512   ALT 18 05/19/2019 0249   ALT 15 04/22/2017 1512   ALKPHOS 69 05/19/2019 0249   ALKPHOS 68 04/22/2017 1512   BILITOT 0.5 05/19/2019 0249   BILITOT 0.31 04/22/2017 1512   GFRNONAA 38 (L) 05/19/2019 0249    GFRAA 44 (L) 05/19/2019 0249    Imaging I have reviewed images in epic and the results pertinent to this consultation are: CT head unremarkable MRI examination of the brain ordered and pending  EEG normal  Assessment: 53 year old with probable hypoglycemic induced seizure versus seizures in the setting of cocaine abuse and alcohol abuse. Her seizure description seems functional with head nodding head bobbing and flailing. Does not look like she has Wernicke's but it is reasonable to continue thiamine.  Recommendations: Pending imaging EEG unremarkable Question nonepileptic events-do maintain seizure precautions but will hold off on using antiepileptics for now If possible, get any outside neurology records-she was not able to provide me who her neurologist is if there is one on the outpatient setting. We will follow-up with you with imaging.  -- Amie Portland, MD [facility] Pager: [phone] If 7pm to 7am, please call on call as listed on AMION.   ADDENDMUM   Per [facility] statutes, patients with seizures are not allowed to drive until they have been seizure-free for six months.   Use caution when using heavy equipment or power tools. Avoid working on ladders or at heights. Take showers instead of baths. Ensure the water temperature is not too high on the home water heater. Do not go swimming alone. Do not lock yourself in a room alone (i.e. bathroom). When caring for infants or small children, sit down when holding, feeding, or changing them to minimize risk of injury to the child in the event you have a seizure. Maintain good sleep hygiene. Avoid alcohol.   If patient has another seizure, call 911 and bring them back to the ED if: A. The seizure lasts longer than 5 minutes.  B. The patient doesn't wake shortly after the seizure or has new problems such as difficulty seeing, speaking or moving following the seizure C. The patient was injured during  the seizure D. The patient has a temperature over 102 F (39C) E. The patient vomited during the seizure and now is having trouble breathing

## 2019-05-19 NOTE — Progress Notes (Signed)
Routine EEG completed, results pending. 

## 2019-05-19 NOTE — Progress Notes (Signed)
Attempted MRI on pt.  Pt started having seizure like activity.  She would lay still for a minute and then be all over the place again.  Unable to obtain imaging at this time. Please call when pt able to cooperate for exam.

## 2019-05-19 NOTE — Progress Notes (Signed)
PROGRESS NOTE    Sherry Baker  MAU:[id number] DOB: 1966/03/27 DOA: 05/18/2019 PCP: Patient, No Pcp Per     Brief Narrative:  Sherry Baker is a 53 y.o. female with history of polysubstance abuse, rectal cancer cirrhosis of liver was brought to the ER after patient's friends found the patient was having altered mental status with possible seizure-like activity.  Exact nature of patient's seizure-like activity is not known.  Per report patient was drinking alcohol and abusing drugs during the episode. In the ER patient was found to be in altered mental status.  Responding to her name only.  CT head was unremarkable patient was afebrile.  COVID-19 test was negative.  Labs reveal UA concerning for UTI creatinine was 1.9 ammonia was 24 WBC count was 5.9 lactate was 2.3 hemoglobin 10.8 platelets 202 chest x-ray nothing acute.  Patient was started on ceftriaxone for UTI and admitted for acute encephalopathy. Urine drug screen is positive for cocaine.  New events last 24 hours / Subjective: Patient was unable to complete MRI this morning due to questionable seizure-like activity during MRI.  Neurology evaluated patient.  EEG unremarkable.  Assessment & Plan:   Principal Problem:   Acute encephalopathy Active Problems:   Alcohol intoxication ([place])   Rectal cancer ([facility])   Hypertension   Acute lower UTI   Polysubstance abuse ([place])   Seizure, hypoglycemia induced versus drug/alcohol-induced -Appreciate neurology -Seizure precaution  -EEG unremarkable -MRI brain pending  ?Wernicke's encephalopathy -IV thiamine  Hypoglycemia -Continue D5, monitor blood sugar  ?  UTI -Empiric Rocephin, urine culture pending  AKI -Continue IV fluids and monitor BMP    DVT prophylaxis: SCD  Code Status: Full Family Communication: None Disposition Plan: Pending clinical improvement   Consultants:   Neurology  Procedures:   EEG 7/16  Antimicrobials:  Anti-infectives (From admission, onward)   Start     Dose/Rate Route Frequency Ordered Stop   05/19/19 1800  cefTRIAXone (ROCEPHIN) 1 g in sodium chloride 0.9 % 100 mL IVPB     1 g 200 mL/hr over 30 Minutes Intravenous Every 24 hours 05/19/19 0138     05/18/19 2015  cefTRIAXone (ROCEPHIN) 1 g in sodium chloride 0.9 % 100 mL IVPB     1 g 200 mL/hr over 30 Minutes Intravenous  Once 05/18/19 2003 05/18/19 2337        Objective: Vitals:   05/19/19 0348 05/19/19 0448 05/19/19 1107 05/19/19 1229  BP: (!) 128/107 113/62 (!) 160/98   Pulse: 98 93 (!) 101   Resp: (!) 31 17 19    Temp:    98.3 F (36.8 C)  TempSrc:    Axillary  SpO2: 92% 96% 98%     Intake/Output Summary (Last 24 hours) at 05/19/2019 1302 Last data filed at 05/19/2019 0745 Gross per 24 hour  Intake 0 ml  Output -  Net 0 ml   There were no vitals filed for this visit.  Examination:  General exam: Appears calm and comfortable  Respiratory system: Clear to auscultation. Respiratory effort normal. Cardiovascular system: S1 & S2 heard, RRR. No JVD, murmurs, rubs, gallops or clicks. No pedal edema. Gastrointestinal system: Abdomen is nondistended, soft and nontender. No organomegaly or masses felt. Normal bowel sounds heard. Central nervous system: Alert to voice, does not answer any questions or follow commands appropriately Extremities: Symmetric  Skin: No rashes, lesions or ulcers  Data Reviewed: I have personally reviewed following labs and imaging studies  CBC: Recent Labs  Lab 05/18/19 1716 05/18/19 1729 05/19/19  0249  WBC 5.9  --  5.8  NEUTROABS 4.7  --  4.5  HGB 10.8* 12.6 11.4*  HCT 34.2* 37.0 35.7*  MCV 89.8  --  89.3  PLT 202  --  937   Basic Metabolic Panel: Recent Labs  Lab 05/18/19 1716 05/18/19 1729 05/19/19 0249  NA 138 140 144  K 3.5 3.6 3.6  CL 109 113* 114*  CO2 15*  --  14*  GLUCOSE 79 76 71  BUN 28* 29* 24*  CREATININE 1.97* 2.10* 1.56*  CALCIUM 9.2  --  8.9  MG  --   --  2.2   GFR: CrCl cannot be calculated (Unknown  ideal weight.). Liver Function Tests: Recent Labs  Lab 05/18/19 1716 05/19/19 0249  AST 33 27  ALT 20 18  ALKPHOS 82 69  BILITOT 0.6 0.5  PROT 8.4* 7.7  ALBUMIN 3.8 3.4*   No results for input(s): LIPASE, AMYLASE in the last 168 hours. Recent Labs  Lab 05/18/19 1730  AMMONIA 24   Coagulation Profile: No results for input(s): INR, PROTIME in the last 168 hours. Cardiac Enzymes: Recent Labs  Lab 05/18/19 1716  CKTOTAL 312*   BNP (last 3 results) No results for input(s): PROBNP in the last 8760 hours. HbA1C: No results for input(s): HGBA1C in the last 72 hours. CBG: Recent Labs  Lab 05/19/19 0531 05/19/19 0755 05/19/19 1023 05/19/19 1057 05/19/19 1201  GLUCAP 168* 61* 64* 144* 106*   Lipid Profile: No results for input(s): CHOL, HDL, LDLCALC, TRIG, CHOLHDL, LDLDIRECT in the last 72 hours. Thyroid Function Tests: No results for input(s): TSH, T4TOTAL, FREET4, T3FREE, THYROIDAB in the last 72 hours. Anemia Panel: Recent Labs    05/19/19 1028  VITAMINB12 160*  FOLATE 13.8  FERRITIN 27  TIBC 398  IRON 147  RETICCTPCT 2.0   Sepsis Labs: Recent Labs  Lab 05/18/19 1730 05/19/19 0404  LATICACIDVEN 2.3* 0.8    Recent Results (from the past 240 hour(s))  SARS Coronavirus 2 (CEPHEID - Performed in [facility] hospital lab), Hosp Order     Status: None   Collection Time: 05/18/19  5:26 PM   Specimen: Nasopharyngeal Swab  Result Value Ref Range Status   SARS Coronavirus 2 NEGATIVE NEGATIVE Final    Comment: (NOTE) If result is NEGATIVE SARS-CoV-2 target nucleic acids are NOT DETECTED. The SARS-CoV-2 RNA is generally detectable in upper and lower  respiratory specimens during the acute phase of infection. The lowest  concentration of SARS-CoV-2 viral copies this assay can detect is 250  copies / mL. A negative result does not preclude SARS-CoV-2 infection  and should not be used as the sole basis for treatment or other  patient management decisions.  A  negative result may occur with  improper specimen collection / handling, submission of specimen other  than nasopharyngeal swab, presence of viral mutation(s) within the  areas targeted by this assay, and inadequate number of viral copies  (<250 copies / mL). A negative result must be combined with clinical  observations, patient history, and epidemiological information. If result is POSITIVE SARS-CoV-2 target nucleic acids are DETECTED. The SARS-CoV-2 RNA is generally detectable in upper and lower  respiratory specimens dur ing the acute phase of infection.  Positive  results are indicative of active infection with SARS-CoV-2.  Clinical  correlation with patient history and other diagnostic information is  necessary to determine patient infection status.  Positive results do  not rule out bacterial infection or co-infection with other viruses.  If result is PRESUMPTIVE POSTIVE SARS-CoV-2 nucleic acids MAY BE PRESENT.   A presumptive positive result was obtained on the submitted specimen  and confirmed on repeat testing.  While 2019 novel coronavirus  (SARS-CoV-2) nucleic acids may be present in the submitted sample  additional confirmatory testing may be necessary for epidemiological  and / or clinical management purposes  to differentiate between  SARS-CoV-2 and other Sarbecovirus currently known to infect humans.  If clinically indicated additional testing with an alternate test  methodology [phone]) is advised. The SARS-CoV-2 RNA is generally  detectable in upper and lower respiratory sp ecimens during the acute  phase of infection. The expected result is Negative. Fact Sheet for Patients:  StrictlyIdeas.no Fact Sheet for Healthcare Providers: BankingDealers.co.za This test is not yet approved or cleared by the [place] FDA and has been authorized for detection and/or diagnosis of SARS-CoV-2 by FDA under an Emergency Use  Authorization (EUA).  This EUA will remain in effect (meaning this test can be used) for the duration of the COVID-19 declaration under Section 564(b)(1) of the Act, 21 U.S.C. section 360bbb-3(b)(1), unless the authorization is terminated or revoked sooner. Performed at [facility], [place] [address], [place], [place] [address]       Radiology Studies: Ct Head Wo Contrast  Result Date: 05/18/2019 CLINICAL DATA:  Altered level of consciousness EXAM: CT HEAD WITHOUT CONTRAST TECHNIQUE: Contiguous axial images were obtained from the base of the skull through the vertex without intravenous contrast. COMPARISON:  11/03/2009 FINDINGS: Brain: No acute intracranial abnormality. Specifically, no hemorrhage, hydrocephalus, mass lesion, acute infarction, or significant intracranial injury. Vascular: No hyperdense vessel or unexpected calcification. Skull: No acute calvarial abnormality. Sinuses/Orbits: Visualized paranasal sinuses and mastoids clear. Orbital soft tissues unremarkable. Other: None IMPRESSION: No acute intracranial abnormality. Electronically Signed   By: Rolm Baptise M.D.   On: 05/18/2019 17:37   Dg Chest Port 1 View  Result Date: 05/18/2019 CLINICAL DATA:  Seizure EXAM: PORTABLE CHEST 1 VIEW COMPARISON:  07/28/2017 FINDINGS: No acute opacity or pleural effusion. Normal cardiomediastinal silhouette. No pneumothorax. Left cervical rib. IMPRESSION: No active disease. Electronically Signed   By: Donavan Foil M.D.   On: 05/18/2019 23:53      Scheduled Meds: . dextrose      . LORazepam       Continuous Infusions: . cefTRIAXone (ROCEPHIN)  IV    . dextrose 5 % and 0.9% NaCl 75 mL/hr at 05/19/19 0450  . thiamine injection 500 mg (05/19/19 1025)     LOS: 0 days      Time spent: 35 minutes   Dessa Phi, DO [facility] [email or web address] 05/19/2019, 1:02 PM

## 2019-05-19 NOTE — Procedures (Signed)
ELECTROENCEPHALOGRAM REPORT   Patient: Sherry Baker       Room #: 8G95A EEG No. ID: [id number] Age: 53 y.o.        Sex: female Referring Physician: Maylene Roes Report Date:  05/19/2019        Interpreting Physician: Alexis Goodell  History: Sherry Baker is an 53 y.o. female with seizure-like activity  Medications:  Thiamine, Rocephin  Conditions of Recording:  This is a 21 channel routine scalp EEG performed with bipolar and monopolar montages arranged in accordance to the international 10/20 system of electrode placement. One channel was dedicated to EKG recording.  The patient is in the drowsy and asleep states.  Description:  The patient is drowsy and sleep throughout the majority of the recording.  With drowse the background is noted to be slow and irregular consisting of low voltage theta and beta activity.  Some benign discharges of sleep are noted.   The patient goes in to a light sleep with symmetrical sleep spindles, vertex central sharp transients and irregular slow activity.  The patient is stimulated and although activation of the background is noted, no full wakefulness can be appreciated.   Hyperventilation and intermittent photic stimulation were not performed.  IMPRESSION: This is a normal electroencephalogram. There are no focal lateralizing or epileptiform features.   Alexis Goodell, MD Neurology [phone] 05/19/2019, 11:33 AM

## 2019-05-20 ENCOUNTER — Telehealth: Payer: Self-pay | Admitting: Oncology

## 2019-05-20 ENCOUNTER — Other Ambulatory Visit: Payer: Self-pay

## 2019-05-20 LAB — BASIC METABOLIC PANEL
Anion gap: 10 (ref 5–15)
BUN: 16 mg/dL (ref 6–20)
CO2: 19 mmol/L — ABNORMAL LOW (ref 22–32)
Calcium: 9.2 mg/dL (ref 8.9–10.3)
Chloride: 115 mmol/L — ABNORMAL HIGH (ref 98–111)
Creatinine, Ser: 1.62 mg/dL — ABNORMAL HIGH (ref 0.44–1.00)
GFR calc Af Amer: 42 mL/min — ABNORMAL LOW (ref >60)
GFR calc non Af Amer: 36 mL/min — ABNORMAL LOW (ref >60)
Glucose, Bld: 89 mg/dL (ref 70–99)
Potassium: 2.9 mmol/L — ABNORMAL LOW (ref 3.5–5.1)
Sodium: 144 mmol/L (ref 135–145)

## 2019-05-20 LAB — CBC
HCT: 32.3 % — ABNORMAL LOW (ref 36.0–46.0)
Hemoglobin: 10.3 g/dL — ABNORMAL LOW (ref 12.0–15.0)
MCH: 28.4 pg (ref 26.0–34.0)
MCHC: 31.9 g/dL (ref 30.0–36.0)
MCV: 89 fL (ref 80.0–100.0)
Platelets: 182 10*3/uL (ref 150–400)
RBC: 3.63 MIL/uL — ABNORMAL LOW (ref 3.87–5.11)
RDW: 17.8 % — ABNORMAL HIGH (ref 11.5–15.5)
WBC: 2.8 10*3/uL — ABNORMAL LOW (ref 4.0–10.5)
nRBC: 0 % (ref 0.0–0.2)

## 2019-05-20 LAB — GLUCOSE, CAPILLARY
Glucose-Capillary: 102 mg/dL — ABNORMAL HIGH (ref 70–99)
Glucose-Capillary: 102 mg/dL — ABNORMAL HIGH (ref 70–99)
Glucose-Capillary: 132 mg/dL — ABNORMAL HIGH (ref 70–99)
Glucose-Capillary: 91 mg/dL (ref 70–99)
Glucose-Capillary: 92 mg/dL (ref 70–99)
Glucose-Capillary: 98 mg/dL (ref 70–99)

## 2019-05-20 LAB — URINE CULTURE: Culture: 40000 — AB

## 2019-05-20 MED ORDER — HALOPERIDOL LACTATE 5 MG/ML IJ SOLN
2.0000 mg | Freq: Once | INTRAMUSCULAR | Status: AC
  Administered 2019-05-20: 2 mg via INTRAVENOUS
  Filled 2019-05-20: qty 1

## 2019-05-20 MED ORDER — POTASSIUM CHLORIDE CRYS ER 20 MEQ PO TBCR
40.0000 meq | EXTENDED_RELEASE_TABLET | ORAL | Status: AC
  Administered 2019-05-20 (×2): 40 meq via ORAL
  Filled 2019-05-20 (×2): qty 2

## 2019-05-20 NOTE — Telephone Encounter (Signed)
Scheduled appt per 7/16 sch message - left message for patient and sent reminder letter in the mail.

## 2019-05-20 NOTE — Progress Notes (Signed)
PROGRESS NOTE    Sherry Baker  OFB:[id number] DOB: 10-01-1966 DOA: 05/18/2019 PCP: Patient, No Pcp Per     Brief Narrative:  Sherry Baker is a 53 y.o. female with history of polysubstance abuse, rectal cancer cirrhosis of liver was brought to the ER after patient's friends found the patient was having altered mental status with possible seizure-like activity.  Exact nature of patient's seizure-like activity is not known.  Per report patient was drinking alcohol and abusing drugs during the episode. In the ER patient was found to be in altered mental status.  Responding to her name only.  CT head was unremarkable patient was afebrile.  COVID-19 test was negative.  Labs reveal UA concerning for UTI creatinine was 1.9 ammonia was 24 WBC count was 5.9 lactate was 2.3 hemoglobin 10.8 platelets 202 chest x-ray nothing acute.  Patient was started on ceftriaxone for UTI and admitted for acute encephalopathy. Urine drug screen is positive for cocaine.  EEG unremarkable.  MRI motion degraded but negative for acute process.  New events last 24 hours / Subjective: Patient awake and alert this morning, no physical complaints today.  Assessment & Plan:   Principal Problem:   Acute encephalopathy Active Problems:   Alcohol intoxication ([place])   Rectal cancer ([facility])   Hypertension   Acute lower UTI   Polysubstance abuse ([facility])   Seizure, hypoglycemia induced versus drug/alcohol-induced -Appreciate neurology -Seizure precaution  -EEG unremarkable -MRI brain motion degraded, negative for acute process -Recommend outpatient neurology follow-up, no antiepileptics for now, recommend seizure precautions as outlined by neurology  ?Wernicke's encephalopathy -IV thiamine x 3 days   Hypoglycemia -Stabilized, resume diet and monitor blood sugars closely  Pyuria -Urine culture with multiple species present, stop antibiotics at this point  AKI on CKD stage III -Baseline creatinine 1.87 in February  2020 -Resolved, creatinine 1.6 today  Hypokalemia -Replace, trend  DVT prophylaxis: SCD  Code Status: Full Family Communication: None Disposition Plan: Pending clinical improvement.  PT OT evaluation ordered   Consultants:   Neurology  Procedures:   EEG 7/16  Antimicrobials:  Anti-infectives (From admission, onward)   Start     Dose/Rate Route Frequency Ordered Stop   05/19/19 1800  cefTRIAXone (ROCEPHIN) 1 g in sodium chloride 0.9 % 100 mL IVPB     1 g 200 mL/hr over 30 Minutes Intravenous Every 24 hours 05/19/19 0138     05/18/19 2015  cefTRIAXone (ROCEPHIN) 1 g in sodium chloride 0.9 % 100 mL IVPB     1 g 200 mL/hr over 30 Minutes Intravenous  Once 05/18/19 2003 05/18/19 2337       Objective: Vitals:   05/19/19 2345 05/20/19 0000 05/20/19 0043 05/20/19 0757  BP:   (!) 160/79 (!) 154/72  Pulse: 91 93 90   Resp: 18  13 18   Temp:   98 F (36.7 C) 97.8 F (36.6 C)  TempSrc:   Oral Oral  SpO2: 97%  99% 99%  Weight:      Height:        Intake/Output Summary (Last 24 hours) at 05/20/2019 1021 Last data filed at 05/20/2019 0400 Gross per 24 hour  Intake 797.16 ml  Output --  Net 797.16 ml   Filed Weights   05/19/19 2117  Weight: 52.3 kg    Examination: General exam: Appears calm and comfortable  Respiratory system: Clear to auscultation. Respiratory effort normal. Cardiovascular system: S1 & S2 heard, RRR. No JVD, murmurs, rubs, gallops or clicks. No pedal edema. Gastrointestinal system:  Abdomen is nondistended, soft and nontender. No organomegaly or masses felt. Normal bowel sounds heard. Central nervous system: Alert and oriented. No focal neurological deficits. Extremities: Symmetric Skin: No rashes, lesions or ulcers Psychiatry: Judgement and insight appear stable  Data Reviewed: I have personally reviewed following labs and imaging studies  CBC: Recent Labs  Lab 05/18/19 1716 05/18/19 1729 05/19/19 0249 05/20/19 0739  WBC 5.9  --  5.8 2.8*   NEUTROABS 4.7  --  4.5  --   HGB 10.8* 12.6 11.4* 10.3*  HCT 34.2* 37.0 35.7* 32.3*  MCV 89.8  --  89.3 89.0  PLT 202  --  194 732   Basic Metabolic Panel: Recent Labs  Lab 05/18/19 1716 05/18/19 1729 05/19/19 0249 05/20/19 0739  NA 138 140 144 144  K 3.5 3.6 3.6 2.9*  CL 109 113* 114* 115*  CO2 15*  --  14* 19*  GLUCOSE 79 76 71 89  BUN 28* 29* 24* 16  CREATININE 1.97* 2.10* 1.56* 1.62*  CALCIUM 9.2  --  8.9 9.2  MG  --   --  2.2  --    GFR: Estimated Creatinine Clearance: 32.5 mL/min (A) (by C-G formula based on SCr of 1.62 mg/dL (H)). Liver Function Tests: Recent Labs  Lab 05/18/19 1716 05/19/19 0249  AST 33 27  ALT 20 18  ALKPHOS 82 69  BILITOT 0.6 0.5  PROT 8.4* 7.7  ALBUMIN 3.8 3.4*   No results for input(s): LIPASE, AMYLASE in the last 168 hours. Recent Labs  Lab 05/18/19 1730  AMMONIA 24   Coagulation Profile: No results for input(s): INR, PROTIME in the last 168 hours. Cardiac Enzymes: Recent Labs  Lab 05/18/19 1716  CKTOTAL 312*   BNP (last 3 results) No results for input(s): PROBNP in the last 8760 hours. HbA1C: No results for input(s): HGBA1C in the last 72 hours. CBG: Recent Labs  Lab 05/19/19 1546 05/19/19 2027 05/20/19 0004 05/20/19 0615 05/20/19 0756  GLUCAP 80 79 91 98 92   Lipid Profile: No results for input(s): CHOL, HDL, LDLCALC, TRIG, CHOLHDL, LDLDIRECT in the last 72 hours. Thyroid Function Tests: No results for input(s): TSH, T4TOTAL, FREET4, T3FREE, THYROIDAB in the last 72 hours. Anemia Panel: Recent Labs    05/19/19 1028  VITAMINB12 160*  FOLATE 13.8  FERRITIN 27  TIBC 398  IRON 147  RETICCTPCT 2.0   Sepsis Labs: Recent Labs  Lab 05/18/19 1730 05/19/19 0404  LATICACIDVEN 2.3* 0.8    Recent Results (from the past 240 hour(s))  Blood Cultures (routine x 2)     Status: None (Preliminary result)   Collection Time: 05/18/19  5:16 PM   Specimen: BLOOD  Result Value Ref Range Status   Specimen  Description BLOOD LEFT ANTECUBITAL  Final   Special Requests   Final    BOTTLES DRAWN AEROBIC AND ANAEROBIC Blood Culture adequate volume   Culture   Final    NO GROWTH < 24 HOURS Performed at [facility], [place] [address] [place], [place] [address]    Report Status PENDING  Incomplete  Blood Cultures (routine x 2)     Status: None (Preliminary result)   Collection Time: 05/18/19  5:19 PM   Specimen: BLOOD RIGHT HAND  Result Value Ref Range Status   Specimen Description BLOOD RIGHT HAND  Final   Special Requests   Final    BOTTLES DRAWN AEROBIC AND ANAEROBIC Blood Culture results may not be optimal due to an excessive volume of  blood received in culture bottles   Culture   Final    NO GROWTH < 24 HOURS Performed at [facility], [place] [address] [place], [place] [address]    Report Status PENDING  Incomplete  SARS Coronavirus 2 (CEPHEID - Performed in [place] hospital lab), Hosp Order     Status: None   Collection Time: 05/18/19  5:26 PM   Specimen: Nasopharyngeal Swab  Result Value Ref Range Status   SARS Coronavirus 2 NEGATIVE NEGATIVE Final    Comment: (NOTE) If result is NEGATIVE SARS-CoV-2 target nucleic acids are NOT DETECTED. The SARS-CoV-2 RNA is generally detectable in upper and lower  respiratory specimens during the acute phase of infection. The lowest  concentration of SARS-CoV-2 viral copies this assay can detect is 250  copies / mL. A negative result does not preclude SARS-CoV-2 infection  and should not be used as the sole basis for treatment or other  patient management decisions.  A negative result may occur with  improper specimen collection / handling, submission of specimen other  than nasopharyngeal swab, presence of viral mutation(s) within the  areas targeted by this assay, and inadequate number of viral copies  (<250 copies / mL). A negative result must be combined with clinical  observations, patient history, and epidemiological  information. If result is POSITIVE SARS-CoV-2 target nucleic acids are DETECTED. The SARS-CoV-2 RNA is generally detectable in upper and lower  respiratory specimens dur ing the acute phase of infection.  Positive  results are indicative of active infection with SARS-CoV-2.  Clinical  correlation with patient history and other diagnostic information is  necessary to determine patient infection status.  Positive results do  not rule out bacterial infection or co-infection with other viruses. If result is PRESUMPTIVE POSTIVE SARS-CoV-2 nucleic acids MAY BE PRESENT.   A presumptive positive result was obtained on the submitted specimen  and confirmed on repeat testing.  While 2019 novel coronavirus  (SARS-CoV-2) nucleic acids may be present in the submitted sample  additional confirmatory testing may be necessary for epidemiological  and / or clinical management purposes  to differentiate between  SARS-CoV-2 and other Sarbecovirus currently known to infect humans.  If clinically indicated additional testing with an alternate test  methodology [phone]) is advised. The SARS-CoV-2 RNA is generally  detectable in upper and lower respiratory sp ecimens during the acute  phase of infection. The expected result is Negative. Fact Sheet for Patients:  StrictlyIdeas.no Fact Sheet for Healthcare Providers: BankingDealers.co.za This test is not yet approved or cleared by the [place] FDA and has been authorized for detection and/or diagnosis of SARS-CoV-2 by FDA under an Emergency Use Authorization (EUA).  This EUA will remain in effect (meaning this test can be used) for the duration of the COVID-19 declaration under Section 564(b)(1) of the Act, 21 U.S.C. section 360bbb-3(b)(1), unless the authorization is terminated or revoked sooner. Performed at [facility], [place] [address], [place], [place] [address]   Urine culture     Status:  Abnormal   Collection Time: 05/18/19  6:40 PM   Specimen: Urine, Random  Result Value Ref Range Status   Specimen Description URINE, RANDOM  Final   Special Requests   Final    NONE Performed at [facility], [place] [address], [place], [place] [address]    Culture (A)  Final    40,000 COLONIES/mL MULTIPLE SPECIES PRESENT, SUGGEST RECOLLECTION   Report Status 05/20/2019 FINAL  Final  Radiology Studies: Ct Head Wo Contrast  Result Date: 05/18/2019 CLINICAL DATA:  Altered level of consciousness EXAM: CT HEAD WITHOUT CONTRAST TECHNIQUE: Contiguous axial images were obtained from the base of the skull through the vertex without intravenous contrast. COMPARISON:  11/03/2009 FINDINGS: Brain: No acute intracranial abnormality. Specifically, no hemorrhage, hydrocephalus, mass lesion, acute infarction, or significant intracranial injury. Vascular: No hyperdense vessel or unexpected calcification. Skull: No acute calvarial abnormality. Sinuses/Orbits: Visualized paranasal sinuses and mastoids clear. Orbital soft tissues unremarkable. Other: None IMPRESSION: No acute intracranial abnormality. Electronically Signed   By: Rolm Baptise M.D.   On: 05/18/2019 17:37   Mr Brain Wo Contrast  Result Date: 05/19/2019 CLINICAL DATA:  Altered level of consciousness. Possible seizure like activity. History of polysubstance abuse. EXAM: MRI HEAD WITHOUT CONTRAST TECHNIQUE: Multiplanar, multiecho pulse sequences of the brain and surrounding structures were obtained without intravenous contrast. COMPARISON:  Head CT 05/18/2019 FINDINGS: The patient was unable to complete the examination. Axial and coronal diffusion, axial FLAIR, SWI, and axial and coronal T2 sequences were obtained and are mildly to moderately motion degraded. Brain: There is no evidence of acute infarct, intracranial hemorrhage, mass, midline shift, or extra-axial fluid collection. Scattered small foci of T2 hyperintensity in the cerebral white  matter and more extensive T2 hyperintensity in the pons are nonspecific. There is mild cerebral atrophy. The hippocampi are grossly symmetric in size and signal within limitations of motion artifact on thin-section coronal T2 weighted imaging through the temporal lobes. Vascular: Major intracranial vascular flow voids are preserved. Skull and upper cervical spine: No suspicious marrow lesion. Sinuses/Orbits: Unremarkable orbits. Clear paranasal sinuses. Left larger than right mastoid effusions. Other: None. IMPRESSION: 1. Motion degraded, incomplete examination. 2. No evidence of acute intracranial abnormality. 3. T2 hyperintensities in the pons and to a lesser extent cerebral white matter, nonspecific though most often seen with chronic small vessel ischemia. 4. Mild cerebral atrophy. Electronically Signed   By: Logan Bores M.D.   On: 05/19/2019 19:42   Dg Chest Port 1 View  Result Date: 05/18/2019 CLINICAL DATA:  Seizure EXAM: PORTABLE CHEST 1 VIEW COMPARISON:  07/28/2017 FINDINGS: No acute opacity or pleural effusion. Normal cardiomediastinal silhouette. No pneumothorax. Left cervical rib. IMPRESSION: No active disease. Electronically Signed   By: Donavan Foil M.D.   On: 05/18/2019 23:53      Scheduled Meds:  potassium chloride  40 mEq Oral Q4H   Continuous Infusions:  cefTRIAXone (ROCEPHIN)  IV 200 mL/hr at 05/19/19 1835   dextrose 5 % and 0.9% NaCl 75 mL/hr at 05/20/19 0355   thiamine injection 500 mg (05/20/19 0834)     LOS: 1 day      Time spent: 25 minutes   Dessa Phi, DO [facility] [email or web address] 05/20/2019, 10:21 AM

## 2019-05-20 NOTE — Progress Notes (Signed)
Patient with increased agitation an anxiety. Pt pulled out Telemetry leads while yelling at staff " I want to go home" Education provided on importance of compliance with medication therapies but pt remained adamant. Pt exhibiting mild tremors and anxiety .Pt with  CIWA score of 12.   Hospitalist NP notified and order received   to give 2 mg  Haldol IV  for agitation. To continue to monitor pt

## 2019-05-20 NOTE — Evaluation (Signed)
Occupational Therapy Evaluation and Discharge Patient Details Name: Sherry Baker MRN: [id number] DOB: 05-30-1966 Today's Date: 05/20/2019    History of Present Illness Sherry Baker is a 53 y.o. female with history of polysubstance abuse, rectal cancer cirrhosis of liver was brought to the ER after patient's friends found the patient was having altered mental status with possible seizure-like activity. Per report patient was drinking alcohol and abusing drugs during the episode. Pt admitted withUTI and admitted for acute encephalopathy   Clinical Impression   This 53 yo female admitted with above presents to acute OT at a min guard A level when up on her feet without AD (normally she uses a RW). Feel she will do fine and be back at her baseline (Mod I to independent) with RW for basic ADLs the more she is up and about without any further skilled OT needs. Pt reports she has always walked with a limp since her surgery for cancer and also has had back pain since then as well. Acute OT will sign off.    Follow Up Recommendations  No OT follow up;Supervision - Intermittent    Equipment Recommendations  None recommended by OT       Precautions / Restrictions Precautions Precautions: Fall Precaution Comments: colostomy Restrictions Weight Bearing Restrictions: No      Mobility Bed Mobility Overal bed mobility: Independent                Transfers Overall transfer level: Needs assistance Equipment used: None Transfers: Sit to/from Stand Sit to Stand: Min guard              Balance Overall balance assessment: Needs assistance Sitting-balance support: No upper extremity supported;Feet supported Sitting balance-Leahy Scale: Good     Standing balance support: No upper extremity supported Standing balance-Leahy Scale: Fair                             ADL either performed or assessed with clinical judgement   ADL Overall ADL's : Needs  assistance/impaired Eating/Feeding: Independent;Sitting   Grooming: Min guard;Standing   Upper Body Bathing: Set up;Sitting   Lower Body Bathing: Min guard;Sit to/from stand   Upper Body Dressing : Set up;Sitting   Lower Body Dressing: Min guard;Sit to/from stand   Toilet Transfer: Min guard;Ambulation;Regular Toilet;Grab bars   Toileting- Clothing Manipulation and Hygiene: Min guard;Sit to/from stand               Vision Baseline Vision/History: Wears glasses Wears Glasses: Reading only              Pertinent Vitals/Pain Pain Assessment: 0-10 Pain Score: 5  Pain Location: lower back Pain Descriptors / Indicators: Sore Pain Intervention(s): Limited activity within patient's tolerance;Monitored during session;Repositioned     Hand Dominance Right   Extremity/Trunk Assessment Upper Extremity Assessment Upper Extremity Assessment: Overall WFL for tasks assessed           Communication Communication Communication: No difficulties   Cognition Arousal/Alertness: Awake/alert Behavior During Therapy: WFL for tasks assessed/performed Overall Cognitive Status: Within Functional Limits for tasks assessed                                                Home Living Family/patient expects to be discharged to:: Private residence Living Arrangements: Non-relatives/Friends Available Help at Discharge:  Friend(s);Available PRN/intermittently Type of Home: House Home Access: Stairs to enter [employer] of Steps: 1 Entrance Stairs-Rails: None Home Layout: One level     Bathroom Shower/Tub: Tub/shower unit;Curtain   Biochemist, clinical: Standard     Home Equipment: Environmental consultant - 2 wheels;Bedside commode;Tub bench;Hand held shower head          Prior Functioning/Environment Level of Independence: Independent                 OT Problem List: Pain;Impaired balance (sitting and/or standing)         OT Goals(Current goals can be  found in the care plan section) Acute Rehab OT Goals Patient Stated Goal: to go home  OT Frequency:                AM-PAC OT "6 Clicks" Daily Activity     Outcome Measure Help from another person eating meals?: None Help from another person taking care of personal grooming?: A Little Help from another person toileting, which includes using toliet, bedpan, or urinal?: A Little Help from another person bathing (including washing, rinsing, drying)?: A Little Help from another person to put on and taking off regular upper body clothing?: A Little Help from another person to put on and taking off regular lower body clothing?: A Little 6 Click Score: 19   End of Session Equipment Utilized During Treatment: Gait belt  Activity Tolerance: Patient tolerated treatment well Patient left: in bed;with call bell/phone within reach;with chair alarm set  OT Visit Diagnosis: Unsteadiness on feet (R26.81);Pain Pain - part of body: (lower back)                Time: 1405-1420 OT Time Calculation (min): 15 min Charges:  OT General Charges $OT Visit: 1 Visit OT Evaluation $OT Eval Moderate Complexity: 1 Mod  [place] [address], OTR/L Acute [employer] Pager [phone] Office [phone]     Almon Register 05/20/2019, 3:44 PM

## 2019-05-20 NOTE — Progress Notes (Signed)
Patient refused 0400 hrs vitals and Cardiac monitoring. Pt Alert Oriented X 4 . Education on importance of compliance with medication therapy given.

## 2019-05-20 NOTE — Progress Notes (Signed)
IMAGING FOLLOW UP NOTE MRI negative for acute process -Needs OP Neurology follow up - maybe will need Epilepsy Monitoring Unit admission to characterize the spells as there is a strong suspicion of non-epileptic events.  -No AEDs for now but maintain seizure precautions as documented in the note from yesterday. -Correction of episodes of hypoglycemia and further management per primary team  Neurology will be available as needed. Please call with questions. -- Amie Portland, MD [facility] Pager: [phone] If 7pm to 7am, please call on call as listed on AMION.

## 2019-05-20 NOTE — Evaluation (Signed)
Physical Therapy Evaluation Patient Details Name: Sherry Baker MRN: [id number] DOB: 1966-06-22 Today's Date: 05/20/2019   History of Present Illness  Cloteal Durkin is a 53 y.o. female with history of polysubstance abuse, rectal cancer cirrhosis of liver was brought to the ER after patient's friends found the patient was having altered mental status with possible seizure-like activity. Per report patient was drinking alcohol and abusing drugs during the episode. Pt admitted withUTI and admitted for acute encephalopathy  Clinical Impression  Patient presents with limp on R which she reports is her baseline.  Does seem to furniture walk in the room, but was not interested in instruction in using a cane nor in me getting cane for her for home use.  Feel she is not far from her baseline.  Will sign off as pt not interested in pursuing further PT at this time.     Follow Up Recommendations No PT follow up    Equipment Recommendations  None recommended by PT    Recommendations for Other Services       Precautions / Restrictions Precautions Precautions: Fall Precaution Comments: colostomy Restrictions Weight Bearing Restrictions: No      Mobility  Bed Mobility Overal bed mobility: Modified Independent                Transfers Overall transfer level: Modified independent Equipment used: None Transfers: Sit to/from Stand Sit to Stand: Min guard            Ambulation/Gait Ambulation/Gait assistance: Modified independent (Device/Increase time) Gait Distance (Feet): 20 Feet Assistive device: None(reaching for sink, grabbar in bathroom) Gait Pattern/deviations: Step-to pattern;Decreased stance time - right;Decreased step length - left;Shuffle     General Gait Details: limping on R LE reports this is her baseline since rectal CA.  Denies need for cane though encouraged to try and allow instruction  Stairs            Wheelchair Mobility    Modified Rankin (Stroke  Patients Only)       Balance Overall balance assessment: Needs assistance Sitting-balance support: No upper extremity supported;Feet supported Sitting balance-Leahy Scale: Good     Standing balance support: No upper extremity supported Standing balance-Leahy Scale: Good                               Pertinent Vitals/Pain Pain Assessment: No/denies pain Pain Score: 5  Pain Location: lower back Pain Descriptors / Indicators: Sore Pain Intervention(s): Limited activity within patient's tolerance;Monitored during session;Repositioned    Home Living Family/patient expects to be discharged to:: Private residence Living Arrangements: Non-relatives/Friends Available Help at Discharge: Friend(s);Available PRN/intermittently Type of Home: House Home Access: Stairs to enter Entrance Stairs-Rails: None Entrance Stairs-Number of Steps: 1 Home Layout: One level Home Equipment: Walker - 2 wheels;Bedside commode;Tub bench;Hand held shower head      Prior Function Level of Independence: Independent               Hand Dominance   Dominant Hand: Right    Extremity/Trunk Assessment   Upper Extremity Assessment Upper Extremity Assessment: Defer to OT evaluation    Lower Extremity Assessment Lower Extremity Assessment: Generalized weakness       Communication   Communication: No difficulties  Cognition Arousal/Alertness: Awake/alert Behavior During Therapy: WFL for tasks assessed/performed Overall Cognitive Status: Within Functional Limits for tasks assessed  General Comments      Exercises     Assessment/Plan    PT Assessment Patent does not need any further PT services  PT Problem List         PT Treatment Interventions      PT Goals (Current goals can be found in the Care Plan section)  Acute Rehab PT Goals Patient Stated Goal: to go home PT Goal Formulation: All assessment and education  complete, DC therapy    Frequency     Barriers to discharge        Co-evaluation               AM-PAC PT "6 Clicks" Mobility  Outcome Measure Help needed turning from your back to your side while in a flat bed without using bedrails?: None Help needed moving from lying on your back to sitting on the side of a flat bed without using bedrails?: None Help needed moving to and from a bed to a chair (including a wheelchair)?: None Help needed standing up from a chair using your arms (e.g., wheelchair or bedside chair)?: None Help needed to walk in hospital room?: None Help needed climbing 3-5 steps with a railing? : A Little 6 Click Score: 23    End of Session   Activity Tolerance: Patient tolerated treatment well Patient left: in bed;with call bell/phone within reach;with bed alarm set Nurse Communication: Other (comment)(pt not interested in PT) PT Visit Diagnosis: Other abnormalities of gait and mobility (R26.89)    Time: 1555-1606 PT Time Calculation (min) (ACUTE ONLY): 11 min   Charges:   PT Evaluation $PT Eval Low Complexity: [place], PT Acute Rehabilitation Services [phone] 05/20/2019   Reginia Naas 05/20/2019, 4:19 PM

## 2019-05-21 DIAGNOSIS — R569 Unspecified convulsions: Secondary | ICD-10-CM

## 2019-05-21 LAB — BASIC METABOLIC PANEL
Anion gap: 8 (ref 5–15)
BUN: 16 mg/dL (ref 6–20)
CO2: 17 mmol/L — ABNORMAL LOW (ref 22–32)
Calcium: 9.1 mg/dL (ref 8.9–10.3)
Chloride: 114 mmol/L — ABNORMAL HIGH (ref 98–111)
Creatinine, Ser: 1.91 mg/dL — ABNORMAL HIGH (ref 0.44–1.00)
GFR calc Af Amer: 35 mL/min — ABNORMAL LOW (ref >60)
GFR calc non Af Amer: 30 mL/min — ABNORMAL LOW (ref >60)
Glucose, Bld: 88 mg/dL (ref 70–99)
Potassium: 3.8 mmol/L (ref 3.5–5.1)
Sodium: 139 mmol/L (ref 135–145)

## 2019-05-21 LAB — GLUCOSE, CAPILLARY
Glucose-Capillary: 80 mg/dL (ref 70–99)
Glucose-Capillary: 88 mg/dL (ref 70–99)
Glucose-Capillary: 92 mg/dL (ref 70–99)
Glucose-Capillary: 107 mg/dL — ABNORMAL HIGH (ref 70–99)
Glucose-Capillary: 76 mg/dL (ref 70–99)

## 2019-05-21 LAB — MAGNESIUM: Magnesium: 1.8 mg/dL (ref 1.7–2.4)

## 2019-05-21 LAB — VITAMIN B1: Vitamin B1 (Thiamine): 117.7 nmol/L (ref 66.5–200.0)

## 2019-05-21 MED ORDER — THIAMINE HCL 100 MG/ML IJ SOLN
500.0000 mg | Freq: Three times a day (TID) | INTRAVENOUS | Status: AC
  Administered 2019-05-21 (×3): 500 mg via INTRAVENOUS
  Filled 2019-05-21 (×3): qty 5

## 2019-05-21 MED ORDER — VITAMIN B-1 100 MG PO TABS: 100.0000 mg | ORAL_TABLET | Freq: Every day | ORAL | 3 refills | Status: DC

## 2019-05-21 NOTE — Progress Notes (Signed)
Patient was discharged home by MD order; discharged instructions review and give to patient with care notes; IV DIC;  patient will be escorted to the car by nurse tech via wheelchair.  

## 2019-05-21 NOTE — Progress Notes (Signed)
Per MD verbal order patient will be discharge after 6 PM IV dose of Thiamine.

## 2019-05-21 NOTE — Discharge Summary (Signed)
Physician Discharge Summary  Eliya Bubar GDJ:[id number] DOB: 1966-01-30 DOA: 05/18/2019  PCP: Patient, No Pcp Per  Admit date: 05/18/2019 Discharge date: 05/21/2019  Admitted From: Home Disposition:  Home  Recommendations for Outpatient Follow-up:  1. Follow up with PCP 06/10/2019  2. Follow up with outpatient neurology.  Outpatient referral placed 3. Per [facility] statutes, patients with seizures are not allowed to drive until they have been seizure-free for six months. Use caution when using heavy equipment or power tools. Avoid working on ladders or at heights. Take showers instead of baths. Ensure the water temperature is not too high on the home water heater. Do not go swimming alone. When caring for infants or small children, sit down when holding, feeding, or changing them to minimize risk of injury to the child in the event you have a seizure. Also, Maintain good sleep hygiene. Avoid alcohol.  If patient has another seizure, call 911 and bring them back to the ED if: A.  The seizure lasts longer than 5 minutes.      B.  The patient doesn't wake shortly after the seizure or has new problems such as difficulty seeing, speaking or moving following the seizure C.  The patient was injured during the seizure D.  The patient has a temperature over 102 F (39C) E.  The patient vomited during the seizure and now is having trouble breathing  Discharge Condition: Stable CODE STATUS: Full   Diet recommendation: Heart healthy   Brief/Interim Summary: Mikiala Oldhamis a 53 y.o.femalewithhistory of polysubstance abuse, rectal cancer cirrhosis of liver was brought to the ER after patient's friends found the patient was having altered mental status with possible seizure-like activity. Exact nature of patient's seizure-like activity is not known. Per report patient was drinking alcohol and abusing drugs during the episode. In the ER patient was found to be in altered mental status. Responding  to her name only. CT head was unremarkable patient was afebrile. COVID-19 test was negative. Labs reveal UA concerning for UTI creatinine was 1.9 ammonia was 24 WBC count was 5.9 lactate was 2.3 hemoglobin 10.8 platelets 202 chest x-ray nothing acute. Patient was started on ceftriaxone for UTI and admitted for acute encephalopathy.Urine drug screen is positive for cocaine.  EEG unremarkable.  MRI motion degraded but negative for acute process.   Discharge Diagnoses:  Principal Problem:   Acute encephalopathy Active Problems:   Alcohol intoxication ([place])   Rectal cancer ([facility])   Hypertension   Acute lower UTI   Polysubstance abuse ([place])   Seizure ([place])   Seizure, hypoglycemia induced versus drug/alcohol-induced -Appreciate neurology -Seizure precaution  -EEG unremarkable -MRI brain motion degraded, negative for acute process -Recommend outpatient neurology follow-up, no antiepileptics for now, recommend seizure precautions as outlined by neurology  ?Wernicke's encephalopathy -IV thiamine x 3 days.  Continue p.o. thiamine after discharge  Hypoglycemia -Stabilized, resume diet and monitor blood sugars closely  Pyuria -Urine culture with multiple species present, stop antibiotics at this point  AKI on CKD stage III -Baseline creatinine 1.87 in February 2020 -Resolved, creatinine 1.9 today    Discharge Instructions  Discharge Instructions    Ambulatory referral to Neurology   Complete by: As directed    An appointment is requested in approximately: 4 weeks   Call MD for:  difficulty breathing, headache or visual disturbances   Complete by: As directed    Call MD for:  extreme fatigue   Complete by: As directed    Call MD for:  hives  Complete by: As directed    Call MD for:  persistant dizziness or light-headedness   Complete by: As directed    Call MD for:  persistant nausea and vomiting   Complete by: As directed    Call MD for:  severe uncontrolled pain    Complete by: As directed    Call MD for:  temperature >100.4   Complete by: As directed    Diet - low sodium heart healthy   Complete by: As directed    Discharge instructions   Complete by: As directed    You were cared for by a hospitalist during your hospital stay. If you have any questions about your discharge medications or the care you received while you were in the hospital after you are discharged, you can call the unit and ask to speak with the hospitalist on call if the hospitalist that took care of you is not available. Once you are discharged, your primary care physician will handle any further medical issues. Please note that NO REFILLS for any discharge medications will be authorized once you are discharged, as it is imperative that you return to your primary care physician (or establish a relationship with a primary care physician if you do not have one) for your aftercare needs so that they can reassess your need for medications and monitor your lab values.   Discharge instructions   Complete by: As directed    Per [facility] statutes, patients with seizures are not allowed to drive untilthey have been seizure-free for six months. Use caution when using heavy equipment or power tools. Avoid working on ladders or at heights. Take showers instead of baths. Ensure the water temperature is not too high on the home water heater. Do not go swimming alone. When caring for infants or small children, sit down when holding, feeding, or changing them to minimize risk of injury to the child in the event you have a seizure. Also, Maintain good sleep hygiene. Avoid alcohol.  If patienthas another seizure, call 911 and bring them back to the ED if: A. The seizure lasts longer than 5 minutes.  B. The patient doesn't wake shortly after the seizure or has new problems such as difficulty seeing, speaking or moving following the seizure C. The patient was injured during the seizure D.  The patient has a temperature over 102 F (39C) E. The patient vomited during the seizure and now is having trouble breathing    Increase activity slowly   Complete by: As directed      Allergies as of 05/21/2019      Reactions   Penicillins Hives, Swelling   Has patient had a PCN reaction causing immediate rash, facial/tongue/throat swelling, SOB or lightheadedness with hypotension: Yes Has patient had a PCN reaction causing severe rash involving mucus membranes or skin necrosis: No Has patient had a PCN reaction that required hospitalization No Has patient had a PCN reaction occurring within the last 10 years: Yes If all of the above answers are "NO", then may proceed with Cephalosporin use.      Medication List    STOP taking these medications   celecoxib 50 MG capsule Commonly known as: CELEBREX   metroNIDAZOLE 500 MG tablet Commonly known as: FLAGYL   naproxen sodium 220 MG tablet Commonly known as: ALEVE   nitrofurantoin (macrocrystal-monohydrate) 100 MG capsule Commonly known as: Macrobid     TAKE these medications   multivitamin with minerals Tabs tablet Take 1 tablet by  mouth daily.   thiamine 100 MG tablet Commonly known as: VITAMIN B-1 Take 1 tablet (100 mg total) by mouth daily. Start taking on: May 22, 2019   Walker Misc 1 standard walker for PRN ambulation use      Follow-up Information    [place]. Go on 06/10/2019.   Why: Hospital Follow up appointment at 2:30pm with Karle Plumber Contact information: [place] [id number] [phone]         Allergies  Allergen Reactions  . Penicillins Hives and Swelling    Has patient had a PCN reaction causing immediate rash, facial/tongue/throat swelling, SOB or lightheadedness with hypotension: Yes Has patient had a PCN reaction causing severe rash involving mucus membranes or skin necrosis: No Has patient had a PCN reaction that  required hospitalization No Has patient had a PCN reaction occurring within the last 10 years: Yes If all of the above answers are "NO", then may proceed with Cephalosporin use.     Consultations:  Neurology   Procedures/Studies: Ct Head Wo Contrast  Result Date: 05/18/2019 CLINICAL DATA:  Altered level of consciousness EXAM: CT HEAD WITHOUT CONTRAST TECHNIQUE: Contiguous axial images were obtained from the base of the skull through the vertex without intravenous contrast. COMPARISON:  11/03/2009 FINDINGS: Brain: No acute intracranial abnormality. Specifically, no hemorrhage, hydrocephalus, mass lesion, acute infarction, or significant intracranial injury. Vascular: No hyperdense vessel or unexpected calcification. Skull: No acute calvarial abnormality. Sinuses/Orbits: Visualized paranasal sinuses and mastoids clear. Orbital soft tissues unremarkable. Other: None IMPRESSION: No acute intracranial abnormality. Electronically Signed   By: Rolm Baptise M.D.   On: 05/18/2019 17:37   Mr Brain Wo Contrast  Result Date: 05/19/2019 CLINICAL DATA:  Altered level of consciousness. Possible seizure like activity. History of polysubstance abuse. EXAM: MRI HEAD WITHOUT CONTRAST TECHNIQUE: Multiplanar, multiecho pulse sequences of the brain and surrounding structures were obtained without intravenous contrast. COMPARISON:  Head CT 05/18/2019 FINDINGS: The patient was unable to complete the examination. Axial and coronal diffusion, axial FLAIR, SWI, and axial and coronal T2 sequences were obtained and are mildly to moderately motion degraded. Brain: There is no evidence of acute infarct, intracranial hemorrhage, mass, midline shift, or extra-axial fluid collection. Scattered small foci of T2 hyperintensity in the cerebral white matter and more extensive T2 hyperintensity in the pons are nonspecific. There is mild cerebral atrophy. The hippocampi are grossly symmetric in size and signal within limitations of  motion artifact on thin-section coronal T2 weighted imaging through the temporal lobes. Vascular: Major intracranial vascular flow voids are preserved. Skull and upper cervical spine: No suspicious marrow lesion. Sinuses/Orbits: Unremarkable orbits. Clear paranasal sinuses. Left larger than right mastoid effusions. Other: None. IMPRESSION: 1. Motion degraded, incomplete examination. 2. No evidence of acute intracranial abnormality. 3. T2 hyperintensities in the pons and to a lesser extent cerebral white matter, nonspecific though most often seen with chronic small vessel ischemia. 4. Mild cerebral atrophy. Electronically Signed   By: Logan Bores M.D.   On: 05/19/2019 19:42   Dg Chest Port 1 View  Result Date: 05/18/2019 CLINICAL DATA:  Seizure EXAM: PORTABLE CHEST 1 VIEW COMPARISON:  07/28/2017 FINDINGS: No acute opacity or pleural effusion. Normal cardiomediastinal silhouette. No pneumothorax. Left cervical rib. IMPRESSION: No active disease. Electronically Signed   By: Donavan Foil M.D.   On: 05/18/2019 23:53    EEG 05/19/2019 Description:  The patient is drowsy and sleep throughout the majority of the recording.  With drowse  the background is noted to be slow and irregular consisting of low voltage theta and beta activity.  Some benign discharges of sleep are noted.   The patient goes in to a light sleep with symmetrical sleep spindles, vertex central sharp transients and irregular slow activity.  The patient is stimulated and although activation of the background is noted, no full wakefulness can be appreciated.   Hyperventilation and intermittent photic stimulation were not performed.  IMPRESSION: This is a normal electroencephalogram. There are no focal lateralizing or epileptiform features.   Discharge Exam: Vitals:   05/20/19 2044 05/21/19 0501  BP: (!) 123/94 (!) 153/85  Pulse: 84 74  Resp:    Temp: 98.2 F (36.8 C) 97.9 F (36.6 C)  SpO2: 99% 99%    General: Pt is alert,  awake, not in acute distress Cardiovascular: RRR, S1/S2 +, no rubs, no gallops Respiratory: CTA bilaterally, no wheezing, no rhonchi Abdominal: Soft, NT, ND, bowel sounds + Extremities: no edema, no cyanosis    The results of significant diagnostics from this hospitalization (including imaging, microbiology, ancillary and laboratory) are listed below for reference.     Microbiology: Recent Results (from the past 240 hour(s))  Blood Cultures (routine x 2)     Status: None (Preliminary result)   Collection Time: 05/18/19  5:16 PM   Specimen: BLOOD  Result Value Ref Range Status   Specimen Description BLOOD LEFT ANTECUBITAL  Final   Special Requests   Final    BOTTLES DRAWN AEROBIC AND ANAEROBIC Blood Culture adequate volume   Culture   Final    NO GROWTH 2 DAYS Performed at [facility], [address] [place], [place] [address]    Report Status PENDING  Incomplete  Blood Cultures (routine x 2)     Status: None (Preliminary result)   Collection Time: 05/18/19  5:19 PM   Specimen: BLOOD RIGHT HAND  Result Value Ref Range Status   Specimen Description BLOOD RIGHT HAND  Final   Special Requests   Final    BOTTLES DRAWN AEROBIC AND ANAEROBIC Blood Culture results may not be optimal due to an excessive volume of blood received in culture bottles   Culture   Final    NO GROWTH 2 DAYS Performed at [facility], [place] [address] [place], [place] [address]    Report Status PENDING  Incomplete  SARS Coronavirus 2 (CEPHEID - Performed in [facility] hospital lab), Hosp Order     Status: None   Collection Time: 05/18/19  5:26 PM   Specimen: Nasopharyngeal Swab  Result Value Ref Range Status   SARS Coronavirus 2 NEGATIVE NEGATIVE Final    Comment: (NOTE) If result is NEGATIVE SARS-CoV-2 target nucleic acids are NOT DETECTED. The SARS-CoV-2 RNA is generally detectable in upper and lower  respiratory specimens during the acute phase of infection. The lowest  concentration of  SARS-CoV-2 viral copies this assay can detect is 250  copies / mL. A negative result does not preclude SARS-CoV-2 infection  and should not be used as the sole basis for treatment or other  patient management decisions.  A negative result may occur with  improper specimen collection / handling, submission of specimen other  than nasopharyngeal swab, presence of viral mutation(s) within the  areas targeted by this assay, and inadequate number of viral copies  (<250 copies / mL). A negative result must be combined with clinical  observations, patient history, and epidemiological information. If result is POSITIVE SARS-CoV-2 target  nucleic acids are DETECTED. The SARS-CoV-2 RNA is generally detectable in upper and lower  respiratory specimens dur ing the acute phase of infection.  Positive  results are indicative of active infection with SARS-CoV-2.  Clinical  correlation with patient history and other diagnostic information is  necessary to determine patient infection status.  Positive results do  not rule out bacterial infection or co-infection with other viruses. If result is PRESUMPTIVE POSTIVE SARS-CoV-2 nucleic acids MAY BE PRESENT.   A presumptive positive result was obtained on the submitted specimen  and confirmed on repeat testing.  While 2019 novel coronavirus  (SARS-CoV-2) nucleic acids may be present in the submitted sample  additional confirmatory testing may be necessary for epidemiological  and / or clinical management purposes  to differentiate between  SARS-CoV-2 and other Sarbecovirus currently known to infect humans.  If clinically indicated additional testing with an alternate test  methodology [phone]) is advised. The SARS-CoV-2 RNA is generally  detectable in upper and lower respiratory sp ecimens during the acute  phase of infection. The expected result is Negative. Fact Sheet for Patients:  StrictlyIdeas.no Fact Sheet for Healthcare  Providers: BankingDealers.co.za This test is not yet approved or cleared by the [place] FDA and has been authorized for detection and/or diagnosis of SARS-CoV-2 by FDA under an Emergency Use Authorization (EUA).  This EUA will remain in effect (meaning this test can be used) for the duration of the COVID-19 declaration under Section 564(b)(1) of the Act, 21 U.S.C. section 360bbb-3(b)(1), unless the authorization is terminated or revoked sooner. Performed at [facility], [place] [address] [place], [place] [address]   Urine culture     Status: Abnormal   Collection Time: 05/18/19  6:40 PM   Specimen: Urine, Random  Result Value Ref Range Status   Specimen Description URINE, RANDOM  Final   Special Requests   Final    NONE Performed at [facility], [place] [address] [place], [place] [address]    Culture (A)  Final    40,000 COLONIES/mL MULTIPLE SPECIES PRESENT, SUGGEST RECOLLECTION   Report Status 05/20/2019 FINAL  Final     Labs: BNP (last 3 results) No results for input(s): BNP in the last 8760 hours. Basic Metabolic Panel: Recent Labs  Lab 05/18/19 1716 05/18/19 1729 05/19/19 0249 05/20/19 0739 05/21/19 0244  NA 138 140 144 144 139  K 3.5 3.6 3.6 2.9* 3.8  CL 109 113* 114* 115* 114*  CO2 15*  --  14* 19* 17*  GLUCOSE 79 76 71 89 88  BUN 28* 29* 24* 16 16  CREATININE 1.97* 2.10* 1.56* 1.62* 1.91*  CALCIUM 9.2  --  8.9 9.2 9.1  MG  --   --  2.2  --  1.8   Liver Function Tests: Recent Labs  Lab 05/18/19 1716 05/19/19 0249  AST 33 27  ALT 20 18  ALKPHOS 82 69  BILITOT 0.6 0.5  PROT 8.4* 7.7  ALBUMIN 3.8 3.4*   No results for input(s): LIPASE, AMYLASE in the last 168 hours. Recent Labs  Lab 05/18/19 1730  AMMONIA 24   CBC: Recent Labs  Lab 05/18/19 1716 05/18/19 1729 05/19/19 0249 05/20/19 0739  WBC 5.9  --  5.8 2.8*  NEUTROABS 4.7  --  4.5  --   HGB 10.8* 12.6 11.4* 10.3*  HCT 34.2* 37.0 35.7* 32.3*  MCV 89.8   --  89.3 89.0  PLT 202  --  194 182   Cardiac Enzymes:  Recent Labs  Lab 05/18/19 1716  CKTOTAL 312*   BNP: Invalid input(s): POCBNP CBG: Recent Labs  Lab 05/20/19 1707 05/20/19 2046 05/21/19 0114 05/21/19 0459 05/21/19 0825  GLUCAP 102* 102* 80 88 92   D-Dimer No results for input(s): DDIMER in the last 72 hours. Hgb A1c No results for input(s): HGBA1C in the last 72 hours. Lipid Profile No results for input(s): CHOL, HDL, LDLCALC, TRIG, CHOLHDL, LDLDIRECT in the last 72 hours. Thyroid function studies No results for input(s): TSH, T4TOTAL, T3FREE, THYROIDAB in the last 72 hours.  Invalid input(s): FREET3 Anemia work up Recent Labs    05/19/19 1028  VITAMINB12 160*  FOLATE 13.8  FERRITIN 27  TIBC 398  IRON 147  RETICCTPCT 2.0   Urinalysis    Component Value Date/Time   COLORURINE YELLOW 05/18/2019 1800   APPEARANCEUR HAZY (A) 05/18/2019 1800   LABSPEC 1.008 05/18/2019 1800   PHURINE 6.0 05/18/2019 1800   GLUCOSEU NEGATIVE 05/18/2019 1800   HGBUR SMALL (A) 05/18/2019 1800   BILIRUBINUR NEGATIVE 05/18/2019 1800   KETONESUR NEGATIVE 05/18/2019 1800   PROTEINUR 30 (A) 05/18/2019 1800   UROBILINOGEN 0.2 12/23/2017 1417   NITRITE NEGATIVE 05/18/2019 1800   LEUKOCYTESUR LARGE (A) 05/18/2019 1800   Sepsis Labs Invalid input(s): PROCALCITONIN,  WBC,  LACTICIDVEN Microbiology Recent Results (from the past 240 hour(s))  Blood Cultures (routine x 2)     Status: None (Preliminary result)   Collection Time: 05/18/19  5:16 PM   Specimen: BLOOD  Result Value Ref Range Status   Specimen Description BLOOD LEFT ANTECUBITAL  Final   Special Requests   Final    BOTTLES DRAWN AEROBIC AND ANAEROBIC Blood Culture adequate volume   Culture   Final    NO GROWTH 2 DAYS Performed at [facility], [address], [place], [place] [address]    Report Status PENDING  Incomplete  Blood Cultures (routine x 2)     Status: None (Preliminary result)   Collection Time:  05/18/19  5:19 PM   Specimen: BLOOD RIGHT HAND  Result Value Ref Range Status   Specimen Description BLOOD RIGHT HAND  Final   Special Requests   Final    BOTTLES DRAWN AEROBIC AND ANAEROBIC Blood Culture results may not be optimal due to an excessive volume of blood received in culture bottles   Culture   Final    NO GROWTH 2 DAYS Performed at [facility], [place] [address], [place],  [address]    Report Status PENDING  Incomplete  SARS Coronavirus 2 (CEPHEID - Performed in [place] hospital lab), Hosp Order     Status: None   Collection Time: 05/18/19  5:26 PM   Specimen: Nasopharyngeal Swab  Result Value Ref Range Status   SARS Coronavirus 2 NEGATIVE NEGATIVE Final    Comment: (NOTE) If result is NEGATIVE SARS-CoV-2 target nucleic acids are NOT DETECTED. The SARS-CoV-2 RNA is generally detectable in upper and lower  respiratory specimens during the acute phase of infection. The lowest  concentration of SARS-CoV-2 viral copies this assay can detect is 250  copies / mL. A negative result does not preclude SARS-CoV-2 infection  and should not be used as the sole basis for treatment or other  patient management decisions.  A negative result may occur with  improper specimen collection / handling, submission of specimen other  than nasopharyngeal swab, presence of viral mutation(s) within the  areas targeted by this assay, and inadequate number of viral  copies  (<250 copies / mL). A negative result must be combined with clinical  observations, patient history, and epidemiological information. If result is POSITIVE SARS-CoV-2 target nucleic acids are DETECTED. The SARS-CoV-2 RNA is generally detectable in upper and lower  respiratory specimens dur ing the acute phase of infection.  Positive  results are indicative of active infection with SARS-CoV-2.  Clinical  correlation with patient history and other diagnostic information is  necessary to determine patient  infection status.  Positive results do  not rule out bacterial infection or co-infection with other viruses. If result is PRESUMPTIVE POSTIVE SARS-CoV-2 nucleic acids MAY BE PRESENT.   A presumptive positive result was obtained on the submitted specimen  and confirmed on repeat testing.  While 2019 novel coronavirus  (SARS-CoV-2) nucleic acids may be present in the submitted sample  additional confirmatory testing may be necessary for epidemiological  and / or clinical management purposes  to differentiate between  SARS-CoV-2 and other Sarbecovirus currently known to infect humans.  If clinically indicated additional testing with an alternate test  methodology [phone]) is advised. The SARS-CoV-2 RNA is generally  detectable in upper and lower respiratory sp ecimens during the acute  phase of infection. The expected result is Negative. Fact Sheet for Patients:  StrictlyIdeas.no Fact Sheet for Healthcare Providers: BankingDealers.co.za This test is not yet approved or cleared by the [place] FDA and has been authorized for detection and/or diagnosis of SARS-CoV-2 by FDA under an Emergency Use Authorization (EUA).  This EUA will remain in effect (meaning this test can be used) for the duration of the COVID-19 declaration under Section 564(b)(1) of the Act, 21 U.S.C. section 360bbb-3(b)(1), unless the authorization is terminated or revoked sooner. Performed at [facility], [place] [address] [place], [place] [address]   Urine culture     Status: Abnormal   Collection Time: 05/18/19  6:40 PM   Specimen: Urine, Random  Result Value Ref Range Status   Specimen Description URINE, RANDOM  Final   Special Requests   Final    NONE Performed at [facility], [place] [address] [place], [place] [address]    Culture (A)  Final    40,000 COLONIES/mL MULTIPLE SPECIES PRESENT, SUGGEST RECOLLECTION   Report Status 05/20/2019 FINAL   Final      Patient was seen and examined on the day of discharge and was found to be in stable condition. Time coordinating discharge: 35 minutes including assessment and coordination of care, as well as examination of the patient.   SIGNED:  Dessa Phi, DO [facility] [email or web address] 05/21/2019, 10:47 AM

## 2019-05-21 NOTE — Discharge Instructions (Signed)
Seizure, Adult °A seizure is a sudden burst of abnormal electrical activity in the brain. Seizures usually last from 30 seconds to 2 minutes. They can cause many different symptoms. °Usually, seizures are not harmful unless they last a long time. °What are the causes? °Common causes of this condition include: °· Fever or infection. °· Conditions that affect the brain, such as: °? A brain abnormality that you were born with. °? A brain or head injury. °? Bleeding in the brain. °? A tumor. °? Stroke. °? Brain disorders such as autism or cerebral palsy. °· Low blood sugar. °· Conditions that are passed from parent to child (are inherited). °· Problems with substances, such as: °? Having a reaction to a drug or a medicine. °? Suddenly stopping the use of a substance (withdrawal). °In some cases, the cause may not be known. A person who has repeated seizures over time without a clear cause has a condition called epilepsy. °What increases the risk? °You are more likely to get this condition if you have: °· A family history of epilepsy. °· Had a seizure in the past. °· A brain disorder. °· A history of head injury, lack of oxygen at birth, or strokes. °What are the signs or symptoms? °There are many types of seizures. The symptoms vary depending on the type of seizure you have. Examples of symptoms during a seizure include: °· Shaking (convulsions). °· Stiffness in the body. °· Passing out (losing consciousness). °· Head nodding. °· Staring. °· Not responding to sound or touch. °· Loss of bladder control and bowel control. °Some people have symptoms right before and right after a seizure happens. °Symptoms before a seizure may include: °· Fear. °· Worry (anxiety). °· Feeling like you may vomit (nauseous). °· Feeling like the room is spinning (vertigo). °· Feeling like you saw or heard something before (déjà vu). °· Odd tastes or smells. °· Changes in how you see. You may see flashing lights or spots. °Symptoms after a  seizure happens can include: °· Confusion. °· Sleepiness. °· Headache. °· Weakness on one side of the body. °How is this treated? °Most seizures will stop on their own in under 5 minutes. In these cases, no treatment is needed. Seizures that last longer than 5 minutes will usually need treatment. Treatment can include: °· Medicines given through an IV tube. °· Avoiding things that are known to cause your seizures. These can include medicines that you take for another condition. °· Medicines to treat epilepsy. °· Surgery to stop the seizures. This may be needed if medicines do not help. °Follow these instructions at home: °Medicines °· Take over-the-counter and prescription medicines only as told by your doctor. °· Do not eat or drink anything that may keep your medicine from working, such as alcohol. °Activity °· Do not do any activities that would be dangerous if you had another seizure, like driving or swimming. Wait until your doctor says it is safe for you to do them. °· If you live in the [place] ask your local DMV (department of motor vehicles) when you can drive. °· Get plenty of rest. °Teaching others °Teach friends and family what to do when you have a seizure. They should: °· Lay you on the ground. °· Protect your head and body. °· Loosen any tight clothing around your neck. °· Turn you on your side. °· Not hold you down. °· Not put anything into your mouth. °· Know whether or not you need emergency care. °· Stay   with you until you are better.  General instructions  Contact your doctor each time you have a seizure.  Avoid anything that gives you seizures.  Keep a seizure diary. Write down: ? What you think caused each seizure. ? What you remember about each seizure.  Keep all follow-up visits as told by your doctor. This is important. Contact a doctor if:  You have another seizure.  You have seizures more often.  There is any change in what happens during your seizures.  You keep having  seizures with treatment.  You have symptoms of being sick or having an infection. Get help right away if:  You have a seizure that: ? Lasts longer than 5 minutes. ? Is different than seizures you had before. ? Makes it harder to breathe. ? Happens after you hurt your head.  You have any of these symptoms after a seizure: ? Not being able to speak. ? Not being able to use a part of your body. ? Confusion. ? A bad headache.  You have two or more seizures in a row.  You do not wake up right after a seizure.  You get hurt during a seizure. These symptoms may be an emergency. Do not wait to see if the symptoms will go away. Get medical help right away. Call your local emergency services (911 in the [place] Do not drive yourself to the hospital. Summary  Seizures usually last from 30 seconds to 2 minutes. Usually, they are not harmful unless they last a long time.  Do not eat or drink anything that may keep your medicine from working, such as alcohol.  Teach friends and family what to do when you have a seizure.  Contact your doctor each time you have a seizure. This information is not intended to replace advice given to you by your health care provider. Make sure you discuss any questions you have with your health care provider. Document Released: 04/07/2008 Document Revised: 01/07/2019 Document Reviewed: 01/07/2019 Elsevier Patient Education  [employer].   Per [employer] statutes, patients with seizures are not allowed to drive until they have been seizure-free for six months. Use caution when using heavy equipment or power tools. Avoid working on ladders or at heights. Take showers instead of baths. Ensure the water temperature is not too high on the home water heater. Do not go swimming alone. When caring for infants or small children, sit down when holding, feeding, or changing them to minimize risk of injury to the child in the event you have a seizure. Also,  Maintain good sleep hygiene. Avoid alcohol.  If patient has another seizure, call 911 and bring them back to the ED if: A.  The seizure lasts longer than 5 minutes.      B.  The patient doesn't wake shortly after the seizure or has new problems such as difficulty seeing, speaking or moving following the seizure C.  The patient was injured during the seizure D.  The patient has a temperature over 102 F (39C) E.  The patient vomited during the seizure and now is having trouble breathing

## 2019-05-23 LAB — CULTURE, BLOOD (ROUTINE X 2)
Culture: NO GROWTH
Culture: NO GROWTH
Special Requests: ADEQUATE

## 2019-05-25 DIAGNOSIS — Z933 Colostomy status: Secondary | ICD-10-CM | POA: Diagnosis not present

## 2019-05-25 DIAGNOSIS — C20 Malignant neoplasm of rectum: Secondary | ICD-10-CM | POA: Diagnosis not present

## 2019-05-25 DIAGNOSIS — R159 Full incontinence of feces: Secondary | ICD-10-CM | POA: Diagnosis not present

## 2019-06-05 ENCOUNTER — Inpatient Hospital Stay (HOSPITAL_COMMUNITY)
Admission: EM | Admit: 2019-06-05 | Discharge: 2019-06-14 | DRG: 896 | Disposition: A | Discharge status: Home / Self Care | Payer: Medicaid Other | Attending: Internal Medicine | Admitting: Internal Medicine

## 2019-06-05 ENCOUNTER — Emergency Department (HOSPITAL_COMMUNITY): Payer: Medicaid Other

## 2019-06-05 DIAGNOSIS — D631 Anemia in chronic kidney disease: Secondary | ICD-10-CM | POA: Diagnosis present

## 2019-06-05 DIAGNOSIS — Y905 Blood alcohol level of 100-119 mg/100 ml: Secondary | ICD-10-CM | POA: Diagnosis present

## 2019-06-05 DIAGNOSIS — F329 Major depressive disorder, single episode, unspecified: Secondary | ICD-10-CM | POA: Diagnosis present

## 2019-06-05 DIAGNOSIS — F17200 Nicotine dependence, unspecified, uncomplicated: Secondary | ICD-10-CM | POA: Diagnosis present

## 2019-06-05 DIAGNOSIS — Z809 Family history of malignant neoplasm, unspecified: Secondary | ICD-10-CM

## 2019-06-05 DIAGNOSIS — E876 Hypokalemia: Secondary | ICD-10-CM | POA: Diagnosis present

## 2019-06-05 DIAGNOSIS — R410 Disorientation, unspecified: Secondary | ICD-10-CM | POA: Diagnosis not present

## 2019-06-05 DIAGNOSIS — Z933 Colostomy status: Secondary | ICD-10-CM

## 2019-06-05 DIAGNOSIS — F1010 Alcohol abuse, uncomplicated: Secondary | ICD-10-CM | POA: Diagnosis present

## 2019-06-05 DIAGNOSIS — J90 Pleural effusion, not elsewhere classified: Secondary | ICD-10-CM | POA: Diagnosis not present

## 2019-06-05 DIAGNOSIS — F1410 Cocaine abuse, uncomplicated: Secondary | ICD-10-CM | POA: Diagnosis present

## 2019-06-05 DIAGNOSIS — G9341 Metabolic encephalopathy: Secondary | ICD-10-CM | POA: Diagnosis present

## 2019-06-05 DIAGNOSIS — I129 Hypertensive chronic kidney disease with stage 1 through stage 4 chronic kidney disease, or unspecified chronic kidney disease: Secondary | ICD-10-CM | POA: Diagnosis present

## 2019-06-05 DIAGNOSIS — Z20828 Contact with and (suspected) exposure to other viral communicable diseases: Secondary | ICD-10-CM | POA: Diagnosis present

## 2019-06-05 DIAGNOSIS — N183 Chronic kidney disease, stage 3 (moderate): Secondary | ICD-10-CM | POA: Diagnosis present

## 2019-06-05 DIAGNOSIS — R569 Unspecified convulsions: Secondary | ICD-10-CM | POA: Diagnosis not present

## 2019-06-05 DIAGNOSIS — N179 Acute kidney failure, unspecified: Secondary | ICD-10-CM | POA: Diagnosis present

## 2019-06-05 DIAGNOSIS — I10 Essential (primary) hypertension: Secondary | ICD-10-CM | POA: Diagnosis not present

## 2019-06-05 DIAGNOSIS — N289 Disorder of kidney and ureter, unspecified: Secondary | ICD-10-CM

## 2019-06-05 DIAGNOSIS — R4020 Unspecified coma: Secondary | ICD-10-CM | POA: Diagnosis not present

## 2019-06-05 DIAGNOSIS — D649 Anemia, unspecified: Secondary | ICD-10-CM | POA: Diagnosis present

## 2019-06-05 DIAGNOSIS — I160 Hypertensive urgency: Secondary | ICD-10-CM | POA: Diagnosis present

## 2019-06-05 DIAGNOSIS — Z03818 Encounter for observation for suspected exposure to other biological agents ruled out: Secondary | ICD-10-CM | POA: Diagnosis not present

## 2019-06-05 DIAGNOSIS — R531 Weakness: Secondary | ICD-10-CM | POA: Diagnosis present

## 2019-06-05 DIAGNOSIS — L8990 Pressure ulcer of unspecified site, unspecified stage: Secondary | ICD-10-CM | POA: Diagnosis present

## 2019-06-05 DIAGNOSIS — Z85048 Personal history of other malignant neoplasm of rectum, rectosigmoid junction, and anus: Secondary | ICD-10-CM

## 2019-06-05 DIAGNOSIS — Z9119 Patient's noncompliance with other medical treatment and regimen: Secondary | ICD-10-CM

## 2019-06-05 DIAGNOSIS — D61818 Other pancytopenia: Secondary | ICD-10-CM | POA: Diagnosis present

## 2019-06-05 DIAGNOSIS — F1910 Other psychoactive substance abuse, uncomplicated: Secondary | ICD-10-CM

## 2019-06-05 DIAGNOSIS — R5381 Other malaise: Secondary | ICD-10-CM | POA: Diagnosis present

## 2019-06-05 DIAGNOSIS — E162 Hypoglycemia, unspecified: Secondary | ICD-10-CM | POA: Diagnosis not present

## 2019-06-05 DIAGNOSIS — N182 Chronic kidney disease, stage 2 (mild): Secondary | ICD-10-CM | POA: Diagnosis present

## 2019-06-05 DIAGNOSIS — F10239 Alcohol dependence with withdrawal, unspecified: Principal | ICD-10-CM | POA: Diagnosis present

## 2019-06-05 DIAGNOSIS — L89154 Pressure ulcer of sacral region, stage 4: Secondary | ICD-10-CM | POA: Diagnosis present

## 2019-06-05 DIAGNOSIS — R4182 Altered mental status, unspecified: Secondary | ICD-10-CM | POA: Diagnosis not present

## 2019-06-05 DIAGNOSIS — Z781 Physical restraint status: Secondary | ICD-10-CM

## 2019-06-05 DIAGNOSIS — G9340 Encephalopathy, unspecified: Secondary | ICD-10-CM | POA: Diagnosis present

## 2019-06-05 LAB — RAPID URINE DRUG SCREEN, HOSP PERFORMED
Amphetamines: NOT DETECTED
Barbiturates: NOT DETECTED
Benzodiazepines: NOT DETECTED
Cocaine: POSITIVE — AB
Opiates: NOT DETECTED
Tetrahydrocannabinol: NOT DETECTED

## 2019-06-05 LAB — URINALYSIS, ROUTINE W REFLEX MICROSCOPIC
Bilirubin Urine: NEGATIVE
Glucose, UA: NEGATIVE mg/dL
Ketones, ur: NEGATIVE mg/dL
Nitrite: NEGATIVE
Protein, ur: NEGATIVE mg/dL
Specific Gravity, Urine: 1.01 (ref 1.005–1.030)
pH: 6 (ref 5.0–8.0)

## 2019-06-05 LAB — CBC WITH DIFFERENTIAL/PLATELET
Abs Immature Granulocytes: 0.01 10*3/uL (ref 0.00–0.07)
Basophils Absolute: 0 10*3/uL (ref 0.0–0.1)
Basophils Relative: 0 %
Eosinophils Absolute: 0.1 10*3/uL (ref 0.0–0.5)
Eosinophils Relative: 3 %
HCT: 33.4 % — ABNORMAL LOW (ref 36.0–46.0)
Hemoglobin: 10.4 g/dL — ABNORMAL LOW (ref 12.0–15.0)
Immature Granulocytes: 0 %
Lymphocytes Relative: 17 %
Lymphs Abs: 0.5 10*3/uL — ABNORMAL LOW (ref 0.7–4.0)
MCH: 28.2 pg (ref 26.0–34.0)
MCHC: 31.1 g/dL (ref 30.0–36.0)
MCV: 90.5 fL (ref 80.0–100.0)
Monocytes Absolute: 0.4 10*3/uL (ref 0.1–1.0)
Monocytes Relative: 12 %
Neutro Abs: 2.1 10*3/uL (ref 1.7–7.7)
Neutrophils Relative %: 68 %
Platelets: 190 10*3/uL (ref 150–400)
RBC: 3.69 MIL/uL — ABNORMAL LOW (ref 3.87–5.11)
RDW: 17.4 % — ABNORMAL HIGH (ref 11.5–15.5)
WBC: 3.1 10*3/uL — ABNORMAL LOW (ref 4.0–10.5)
nRBC: 0 % (ref 0.0–0.2)

## 2019-06-05 LAB — CBG MONITORING, ED: Glucose-Capillary: 79 mg/dL (ref 70–99)

## 2019-06-05 LAB — COMPREHENSIVE METABOLIC PANEL
ALT: 15 U/L (ref 0–44)
AST: 27 U/L (ref 15–41)
Albumin: 3.5 g/dL (ref 3.5–5.0)
Alkaline Phosphatase: 77 U/L (ref 38–126)
Anion gap: 13 (ref 5–15)
BUN: 25 mg/dL — ABNORMAL HIGH (ref 6–20)
CO2: 14 mmol/L — ABNORMAL LOW (ref 22–32)
Calcium: 8.7 mg/dL — ABNORMAL LOW (ref 8.9–10.3)
Chloride: 112 mmol/L — ABNORMAL HIGH (ref 98–111)
Creatinine, Ser: 1.97 mg/dL — ABNORMAL HIGH (ref 0.44–1.00)
GFR calc Af Amer: 33 mL/min — ABNORMAL LOW (ref >60)
GFR calc non Af Amer: 29 mL/min — ABNORMAL LOW (ref >60)
Glucose, Bld: 89 mg/dL (ref 70–99)
Potassium: 3.9 mmol/L (ref 3.5–5.1)
Sodium: 139 mmol/L (ref 135–145)
Total Bilirubin: 0.6 mg/dL (ref 0.3–1.2)
Total Protein: 7.4 g/dL (ref 6.5–8.1)

## 2019-06-05 LAB — ACETAMINOPHEN LEVEL: Acetaminophen (Tylenol), Serum: <10 ug/mL — ABNORMAL LOW (ref 10–30)

## 2019-06-05 LAB — URINALYSIS, MICROSCOPIC (REFLEX)

## 2019-06-05 LAB — ETHANOL: Alcohol, Ethyl (B): 100 mg/dL — ABNORMAL HIGH (ref <10)

## 2019-06-05 LAB — SARS CORONAVIRUS 2 BY RT PCR (HOSPITAL ORDER, PERFORMED IN ~~LOC~~ HOSPITAL LAB): SARS Coronavirus 2: NEGATIVE

## 2019-06-05 LAB — BRAIN NATRIURETIC PEPTIDE: B Natriuretic Peptide: 564 pg/mL — ABNORMAL HIGH (ref 0.0–100.0)

## 2019-06-05 LAB — LIPASE, BLOOD: Lipase: 56 U/L — ABNORMAL HIGH (ref 11–51)

## 2019-06-05 MED ORDER — NALOXONE HCL 0.4 MG/ML IJ SOLN
INTRAMUSCULAR | Status: AC
  Administered 2019-06-05: 0.4 mg
  Filled 2019-06-05: qty 1

## 2019-06-05 MED ORDER — SODIUM CHLORIDE 0.9 % IV SOLN
INTRAVENOUS | Status: DC
  Administered 2019-06-05: 18:00:00 via INTRAVENOUS

## 2019-06-05 MED ORDER — SODIUM CHLORIDE 0.9 % IV BOLUS
500.0000 mL | Freq: Once | INTRAVENOUS | Status: AC
  Administered 2019-06-05: 500 mL via INTRAVENOUS

## 2019-06-05 MED ORDER — ACETAMINOPHEN 325 MG PO TABS: 650.0000 mg | ORAL_TABLET | Freq: Once | ORAL | Status: DC

## 2019-06-05 NOTE — ED Provider Notes (Addendum)
[facility] EMERGENCY DEPARTMENT Provider Note   CSN: [id number] Arrival date & time: 06/05/19  1740     History   Chief Complaint Chief Complaint  Patient presents with  . Altered Mental Status    HPI Sherry HERSCHBERGER is a 53 y.o. female.     Patient's chart did not merge.  But they do have a link.  Patient was admitted July 15-18 for very similar presentation.  Patient has a known history of cirrhosis.  History of cocaine abuse as well as alcohol abuse.  Probably alcohol related seizures.  And a history of rectal cancer.  And patient has a colostomy left side of the abdomen.  And found in the woods passed out..  Is if perhaps she was homeless.  She did have a backpack full of supplies.  Patient upon arrival minimally responsive but protecting airway.  Pupils were pinpoint.  Blood sugar checked which was fine patient given Narcan no change.  Since patient was protecting airway opted to monitor and initiate work-up.     No past medical history on file.  There are no active problems to display for this patient.      OB History   No obstetric history on file.      Home Medications    Prior to Admission medications   Not on File    Family History No family history on file.  Social History Social History   Tobacco Use  . Smoking status: Not on file  Substance Use Topics  . Alcohol use: Not on file  . Drug use: Not on file     Allergies   Patient has no allergy information on record.   Review of Systems Review of Systems  Unable to perform ROS: Mental status change     Physical Exam Updated Vital Signs BP (!) 198/98   Pulse (!) 101   Temp 97.6 F (36.4 C) (Tympanic)   Resp 13   SpO2 95%   Physical Exam Vitals signs and nursing note reviewed.  Constitutional:      Appearance: Normal appearance. She is well-developed. She is ill-appearing.  HENT:     Head: Normocephalic and atraumatic.  Eyes:     Extraocular Movements:  Extraocular movements intact.     Conjunctiva/sclera: Conjunctivae normal.     Pupils: Pupils are equal, round, and reactive to light.  Neck:     Musculoskeletal: Normal range of motion and neck supple.  Cardiovascular:     Rate and Rhythm: Normal rate and regular rhythm.     Heart sounds: No murmur.  Pulmonary:     Effort: Pulmonary effort is normal. No respiratory distress.     Breath sounds: Normal breath sounds.  Abdominal:     Palpations: Abdomen is soft.     Tenderness: There is no abdominal tenderness.     Comments: Healthy-appearing colostomy to the left side of abdomen.  Was soft stool.  Musculoskeletal: Normal range of motion.        General: No swelling.  Skin:    General: Skin is warm and dry.     Capillary Refill: Capillary refill takes less than 2 seconds.  Neurological:     Mental Status: She is alert.     Comments: Patient minimally arousable.  But moving all extremities spontaneously.  Patient is protecting airway.      ED Treatments / Results  Labs (all labs ordered are listed, but only abnormal results are displayed) Labs Reviewed  COMPREHENSIVE  METABOLIC PANEL - Abnormal; Notable for the following components:      Result Value   Chloride 112 (*)    CO2 14 (*)    BUN 25 (*)    Creatinine, Ser 1.97 (*)    Calcium 8.7 (*)    GFR calc non Af Amer 29 (*)    GFR calc Af Amer 33 (*)    All other components within normal limits  LIPASE, BLOOD - Abnormal; Notable for the following components:   Lipase 56 (*)    All other components within normal limits  ETHANOL - Abnormal; Notable for the following components:   Alcohol, Ethyl (B) 100 (*)    All other components within normal limits  URINALYSIS, ROUTINE W REFLEX MICROSCOPIC - Abnormal; Notable for the following components:   Color, Urine STRAW (*)    Hgb urine dipstick SMALL (*)    Leukocytes,Ua SMALL (*)    All other components within normal limits  RAPID URINE DRUG SCREEN, HOSP PERFORMED - Abnormal;  Notable for the following components:   Cocaine POSITIVE (*)    All other components within normal limits  CBC WITH DIFFERENTIAL/PLATELET - Abnormal; Notable for the following components:   WBC 3.1 (*)    RBC 3.69 (*)    Hemoglobin 10.4 (*)    HCT 33.4 (*)    RDW 17.4 (*)    Lymphs Abs 0.5 (*)    All other components within normal limits  BRAIN NATRIURETIC PEPTIDE - Abnormal; Notable for the following components:   B Natriuretic Peptide 564.0 (*)    All other components within normal limits  ACETAMINOPHEN LEVEL - Abnormal; Notable for the following components:   Acetaminophen (Tylenol), Serum <10 (*)    All other components within normal limits  URINALYSIS, MICROSCOPIC (REFLEX) - Abnormal; Notable for the following components:   Bacteria, UA FEW (*)    All other components within normal limits  SARS CORONAVIRUS 2 (HOSPITAL ORDER, [place] [facility])  CBG MONITORING, ED    EKG None  Radiology Ct Head Wo Contrast  Result Date: 06/05/2019 CLINICAL DATA:  Altered level of consciousness EXAM: CT HEAD WITHOUT CONTRAST TECHNIQUE: Contiguous axial images were obtained from the base of the skull through the vertex without intravenous contrast. COMPARISON:  05/18/2019 FINDINGS: Brain: No acute intracranial abnormality. Specifically, no hemorrhage, hydrocephalus, mass lesion, acute infarction, or significant intracranial injury. Vascular: No hyperdense vessel or unexpected calcification. Skull: No acute calvarial abnormality. Sinuses/Orbits: Visualized paranasal sinuses and mastoids clear. Orbital soft tissues unremarkable. Other: None IMPRESSION: No acute intracranial abnormality. Electronically Signed   By: Rolm Baptise M.D.   On: 06/05/2019 19:02   Dg Chest Port 1 View  Result Date: 06/05/2019 CLINICAL DATA:  Altered mental status. EXAM: PORTABLE CHEST 1 VIEW COMPARISON:  05/18/2019 FINDINGS: Normal heart size. No pleural effusion. Pulmonary vascular congestion is new from  previous exam. No airspace opacity. Visualized osseous structures are unremarkable. IMPRESSION: 1. Pulmonary vascular congestion, new from previous exam. Electronically Signed   By: Kerby Moors M.D.   On: 06/05/2019 19:19    Procedures Procedures (including critical care time)  Medications Ordered in ED Medications  0.9 %  sodium chloride infusion ( Intravenous New Bag/Given 06/05/19 1820)  naloxone (NARCAN) 0.4 MG/ML injection (0.4 mg  Given 06/05/19 1815)  sodium chloride 0.9 % bolus 500 mL (0 mLs Intravenous Stopped 06/05/19 2030)     Initial Impression / Assessment and Plan / ED Course  I have reviewed the triage  vital signs and the nursing notes.  Pertinent labs & imaging results that were available during my care of the patient were reviewed by me and considered in my medical decision making (see chart for details).        Patient's work-up included chest x-ray was negative head CT without any acute findings.  Liver function test without abnormality.  Patient's alcohol level was 100.  Urine drug screen eventually came back positive for cocaine.  COVID-19 testing was negative.  Patient now alert and talkative discussed with the hospitalist who did connect [place] and with the previous admission on July 15 which time was identical.  Patient did have concern return for renal insufficiency or acute kidney injury but that is essentially unchanged from where she was just 2 weeks ago.  So do not feel she needs admission for that.  Patient stable for discharge home.  Outpatient resources provided.  Final Clinical Impressions(s) / ED Diagnoses   Final diagnoses:  Altered mental status, unspecified altered mental status type  Confusion  Substance abuse [facility])  Renal insufficiency  Alcohol abuse    ED Discharge Orders    None       Fredia Sorrow, MD 06/05/19 2338   Addendum:  Patient was discharged as she was wanting to go home.  When they got her in the wheelchair to go to the  lobby patient started shaking all over but was awake.  Suspect that she may have had some type of seizure.  Discontinued now she appears postictal.  Patient had IV removed obviously patient will be hooked up to IV received fluids well lactic acid ammonia level repeat CBC BMP.  Will be given Ativan.  [place] protocol will be initiated in case his withdrawal.  But suspect that seizure activity.  She will be monitored and see how these labs come back discussed with Dr. Laurann Montana.  He will contact hospitalist for admission once we have some feel for what is going on.   Fredia Sorrow, MD 06/06/19 0005

## 2019-06-05 NOTE — ED Notes (Signed)
Pt asleep in bed but occasionally waking up and screaming. Will not respond to any questions. Disoriented x4

## 2019-06-05 NOTE — ED Notes (Signed)
Cleaned up pt and placed purewick

## 2019-06-05 NOTE — ED Notes (Signed)
ED Provider at bedside. 

## 2019-06-06 ENCOUNTER — Encounter (HOSPITAL_COMMUNITY): Payer: Self-pay | Admitting: Internal Medicine

## 2019-06-06 DIAGNOSIS — R5381 Other malaise: Secondary | ICD-10-CM | POA: Diagnosis present

## 2019-06-06 DIAGNOSIS — Z781 Physical restraint status: Secondary | ICD-10-CM | POA: Diagnosis not present

## 2019-06-06 DIAGNOSIS — R4020 Unspecified coma: Secondary | ICD-10-CM | POA: Diagnosis not present

## 2019-06-06 DIAGNOSIS — D631 Anemia in chronic kidney disease: Secondary | ICD-10-CM | POA: Diagnosis present

## 2019-06-06 DIAGNOSIS — L89154 Pressure ulcer of sacral region, stage 4: Secondary | ICD-10-CM | POA: Diagnosis present

## 2019-06-06 DIAGNOSIS — E162 Hypoglycemia, unspecified: Secondary | ICD-10-CM | POA: Diagnosis not present

## 2019-06-06 DIAGNOSIS — F1910 Other psychoactive substance abuse, uncomplicated: Secondary | ICD-10-CM | POA: Diagnosis not present

## 2019-06-06 DIAGNOSIS — Y905 Blood alcohol level of 100-119 mg/100 ml: Secondary | ICD-10-CM | POA: Diagnosis present

## 2019-06-06 DIAGNOSIS — I129 Hypertensive chronic kidney disease with stage 1 through stage 4 chronic kidney disease, or unspecified chronic kidney disease: Secondary | ICD-10-CM | POA: Diagnosis present

## 2019-06-06 DIAGNOSIS — G9341 Metabolic encephalopathy: Secondary | ICD-10-CM | POA: Diagnosis present

## 2019-06-06 DIAGNOSIS — Z809 Family history of malignant neoplasm, unspecified: Secondary | ICD-10-CM | POA: Diagnosis not present

## 2019-06-06 DIAGNOSIS — I160 Hypertensive urgency: Secondary | ICD-10-CM | POA: Diagnosis not present

## 2019-06-06 DIAGNOSIS — Z9119 Patient's noncompliance with other medical treatment and regimen: Secondary | ICD-10-CM | POA: Diagnosis not present

## 2019-06-06 DIAGNOSIS — Z20828 Contact with and (suspected) exposure to other viral communicable diseases: Secondary | ICD-10-CM | POA: Diagnosis present

## 2019-06-06 DIAGNOSIS — L8990 Pressure ulcer of unspecified site, unspecified stage: Secondary | ICD-10-CM | POA: Diagnosis present

## 2019-06-06 DIAGNOSIS — R410 Disorientation, unspecified: Secondary | ICD-10-CM | POA: Diagnosis not present

## 2019-06-06 DIAGNOSIS — R569 Unspecified convulsions: Secondary | ICD-10-CM | POA: Diagnosis not present

## 2019-06-06 DIAGNOSIS — F1410 Cocaine abuse, uncomplicated: Secondary | ICD-10-CM | POA: Diagnosis present

## 2019-06-06 DIAGNOSIS — N289 Disorder of kidney and ureter, unspecified: Secondary | ICD-10-CM | POA: Diagnosis not present

## 2019-06-06 DIAGNOSIS — F17200 Nicotine dependence, unspecified, uncomplicated: Secondary | ICD-10-CM | POA: Diagnosis present

## 2019-06-06 DIAGNOSIS — D649 Anemia, unspecified: Secondary | ICD-10-CM | POA: Diagnosis present

## 2019-06-06 DIAGNOSIS — Z85048 Personal history of other malignant neoplasm of rectum, rectosigmoid junction, and anus: Secondary | ICD-10-CM | POA: Diagnosis not present

## 2019-06-06 DIAGNOSIS — Z933 Colostomy status: Secondary | ICD-10-CM | POA: Diagnosis not present

## 2019-06-06 DIAGNOSIS — N182 Chronic kidney disease, stage 2 (mild): Secondary | ICD-10-CM | POA: Diagnosis present

## 2019-06-06 DIAGNOSIS — D61818 Other pancytopenia: Secondary | ICD-10-CM | POA: Diagnosis present

## 2019-06-06 DIAGNOSIS — E876 Hypokalemia: Secondary | ICD-10-CM | POA: Diagnosis present

## 2019-06-06 DIAGNOSIS — F329 Major depressive disorder, single episode, unspecified: Secondary | ICD-10-CM | POA: Diagnosis present

## 2019-06-06 DIAGNOSIS — G9340 Encephalopathy, unspecified: Secondary | ICD-10-CM | POA: Diagnosis not present

## 2019-06-06 DIAGNOSIS — N183 Chronic kidney disease, stage 3 (moderate): Secondary | ICD-10-CM | POA: Diagnosis present

## 2019-06-06 DIAGNOSIS — Z03818 Encounter for observation for suspected exposure to other biological agents ruled out: Secondary | ICD-10-CM | POA: Diagnosis not present

## 2019-06-06 DIAGNOSIS — R531 Weakness: Secondary | ICD-10-CM | POA: Diagnosis present

## 2019-06-06 DIAGNOSIS — F10239 Alcohol dependence with withdrawal, unspecified: Secondary | ICD-10-CM | POA: Diagnosis present

## 2019-06-06 DIAGNOSIS — R4182 Altered mental status, unspecified: Secondary | ICD-10-CM | POA: Diagnosis not present

## 2019-06-06 DIAGNOSIS — J90 Pleural effusion, not elsewhere classified: Secondary | ICD-10-CM | POA: Diagnosis not present

## 2019-06-06 DIAGNOSIS — N179 Acute kidney failure, unspecified: Secondary | ICD-10-CM | POA: Diagnosis present

## 2019-06-06 LAB — BASIC METABOLIC PANEL
Anion gap: 11 (ref 5–15)
BUN: 23 mg/dL — ABNORMAL HIGH (ref 6–20)
CO2: 18 mmol/L — ABNORMAL LOW (ref 22–32)
Calcium: 8.7 mg/dL — ABNORMAL LOW (ref 8.9–10.3)
Chloride: 114 mmol/L — ABNORMAL HIGH (ref 98–111)
Creatinine, Ser: 1.84 mg/dL — ABNORMAL HIGH (ref 0.44–1.00)
GFR calc Af Amer: 36 mL/min — ABNORMAL LOW (ref >60)
GFR calc non Af Amer: 31 mL/min — ABNORMAL LOW (ref >60)
Glucose, Bld: 98 mg/dL (ref 70–99)
Potassium: 3.8 mmol/L (ref 3.5–5.1)
Sodium: 143 mmol/L (ref 135–145)

## 2019-06-06 LAB — MAGNESIUM: Magnesium: 2.3 mg/dL (ref 1.7–2.4)

## 2019-06-06 LAB — MRSA PCR SCREENING: MRSA by PCR: POSITIVE — AB

## 2019-06-06 LAB — AMMONIA: Ammonia: 35 umol/L (ref 9–35)

## 2019-06-06 LAB — CBC
HCT: 32.5 % — ABNORMAL LOW (ref 36.0–46.0)
Hemoglobin: 10.2 g/dL — ABNORMAL LOW (ref 12.0–15.0)
MCH: 28.3 pg (ref 26.0–34.0)
MCHC: 31.4 g/dL (ref 30.0–36.0)
MCV: 90.3 fL (ref 80.0–100.0)
Platelets: 203 10*3/uL (ref 150–400)
RBC: 3.6 MIL/uL — ABNORMAL LOW (ref 3.87–5.11)
RDW: 17.6 % — ABNORMAL HIGH (ref 11.5–15.5)
WBC: 4.5 10*3/uL (ref 4.0–10.5)
nRBC: 0 % (ref 0.0–0.2)

## 2019-06-06 LAB — HIV ANTIBODY (ROUTINE TESTING W REFLEX): HIV Screen 4th Generation wRfx: NONREACTIVE

## 2019-06-06 LAB — LACTIC ACID, PLASMA: Lactic Acid, Venous: 1 mmol/L (ref 0.5–1.9)

## 2019-06-06 LAB — CBG MONITORING, ED
Glucose-Capillary: 82 mg/dL (ref 70–99)
Glucose-Capillary: 91 mg/dL (ref 70–99)

## 2019-06-06 MED ORDER — FOLIC ACID 1 MG PO TABS: 1.0000 mg | ORAL_TABLET | Freq: Every day | ORAL | Status: DC

## 2019-06-06 MED ORDER — LORAZEPAM 2 MG/ML IJ SOLN
1.0000 mg | Freq: Once | INTRAMUSCULAR | Status: AC
  Administered 2019-06-06: 1 mg via INTRAVENOUS
  Filled 2019-06-06: qty 1

## 2019-06-06 MED ORDER — LORAZEPAM 1 MG PO TABS
1.0000 mg | ORAL_TABLET | Freq: Four times a day (QID) | ORAL | Status: DC | PRN
  Filled 2019-06-06 (×2): qty 1

## 2019-06-06 MED ORDER — ONDANSETRON HCL 4 MG PO TABS: 4.0000 mg | ORAL_TABLET | Freq: Four times a day (QID) | ORAL | Status: DC | PRN

## 2019-06-06 MED ORDER — ENOXAPARIN SODIUM 40 MG/0.4ML ~~LOC~~ SOLN
40.0000 mg | Freq: Every day | SUBCUTANEOUS | Status: DC
  Administered 2019-06-06 – 2019-06-13 (×8): 40 mg via SUBCUTANEOUS
  Filled 2019-06-06 (×9): qty 0.4

## 2019-06-06 MED ORDER — THIAMINE HCL 100 MG/ML IJ SOLN
100.0000 mg | Freq: Every day | INTRAMUSCULAR | Status: DC
  Administered 2019-06-06 – 2019-06-10 (×5): 100 mg via INTRAVENOUS
  Filled 2019-06-06 (×6): qty 2

## 2019-06-06 MED ORDER — ORAL CARE MOUTH RINSE
15.0000 mL | Freq: Two times a day (BID) | OROMUCOSAL | Status: DC
  Administered 2019-06-07 – 2019-06-09 (×4): 15 mL via OROMUCOSAL

## 2019-06-06 MED ORDER — LORAZEPAM 2 MG/ML IJ SOLN
0.0000 mg | Freq: Two times a day (BID) | INTRAMUSCULAR | Status: DC
  Administered 2019-06-08: 2 mg via INTRAVENOUS
  Filled 2019-06-06 (×2): qty 1

## 2019-06-06 MED ORDER — SODIUM CHLORIDE 0.9 % IV BOLUS
500.0000 mL | Freq: Once | INTRAVENOUS | Status: AC
  Administered 2019-06-06: 500 mL via INTRAVENOUS

## 2019-06-06 MED ORDER — THIAMINE HCL 100 MG/ML IJ SOLN
100.0000 mg | Freq: Every day | INTRAMUSCULAR | Status: DC
  Filled 2019-06-06: qty 2

## 2019-06-06 MED ORDER — LORAZEPAM 2 MG/ML IJ SOLN
0.0000 mg | Freq: Four times a day (QID) | INTRAMUSCULAR | Status: AC
  Administered 2019-06-06 (×2): 2 mg via INTRAVENOUS
  Administered 2019-06-06 – 2019-06-07 (×5): 4 mg via INTRAVENOUS
  Administered 2019-06-08: 2 mg via INTRAVENOUS
  Filled 2019-06-06 (×4): qty 2
  Filled 2019-06-06: qty 1
  Filled 2019-06-06 (×3): qty 2
  Filled 2019-06-06: qty 1

## 2019-06-06 MED ORDER — ACETAMINOPHEN 325 MG PO TABS
650.0000 mg | ORAL_TABLET | Freq: Four times a day (QID) | ORAL | Status: DC | PRN
  Administered 2019-06-11 – 2019-06-14 (×4): 650 mg via ORAL
  Filled 2019-06-06 (×4): qty 2

## 2019-06-06 MED ORDER — MUPIROCIN 2 % EX OINT
1.0000 "application " | TOPICAL_OINTMENT | Freq: Two times a day (BID) | CUTANEOUS | Status: DC
  Administered 2019-06-06 – 2019-06-10 (×7): 1 via NASAL
  Filled 2019-06-06 (×3): qty 22

## 2019-06-06 MED ORDER — LORAZEPAM 2 MG/ML IJ SOLN: 0.0000 mg | Freq: Four times a day (QID) | INTRAMUSCULAR | Status: DC

## 2019-06-06 MED ORDER — ONDANSETRON HCL 4 MG/2ML IJ SOLN: 4.0000 mg | Freq: Four times a day (QID) | INTRAMUSCULAR | Status: DC | PRN

## 2019-06-06 MED ORDER — LORAZEPAM 2 MG/ML IJ SOLN
1.0000 mg | Freq: Four times a day (QID) | INTRAMUSCULAR | Status: DC | PRN
  Administered 2019-06-06 (×2): 1 mg via INTRAVENOUS
  Filled 2019-06-06 (×4): qty 1

## 2019-06-06 MED ORDER — HYDRALAZINE HCL 20 MG/ML IJ SOLN
10.0000 mg | INTRAMUSCULAR | Status: DC | PRN
  Administered 2019-06-06 – 2019-06-11 (×9): 10 mg via INTRAVENOUS
  Filled 2019-06-06 (×9): qty 1

## 2019-06-06 MED ORDER — VITAMIN B-1 100 MG PO TABS: 100.0000 mg | ORAL_TABLET | Freq: Every day | ORAL | Status: DC

## 2019-06-06 MED ORDER — DEXTROSE-NACL 5-0.45 % IV SOLN
INTRAVENOUS | Status: DC
  Administered 2019-06-06 – 2019-06-07 (×3): via INTRAVENOUS

## 2019-06-06 MED ORDER — ADULT MULTIVITAMIN W/MINERALS CH
1.0000 | ORAL_TABLET | Freq: Every day | ORAL | Status: DC
  Administered 2019-06-12 – 2019-06-14 (×3): 1 via ORAL
  Filled 2019-06-06 (×4): qty 1

## 2019-06-06 MED ORDER — METOPROLOL TARTRATE 5 MG/5ML IV SOLN
5.0000 mg | INTRAVENOUS | Status: AC | PRN
  Administered 2019-06-06 – 2019-06-08 (×2): 5 mg via INTRAVENOUS
  Filled 2019-06-06 (×2): qty 5

## 2019-06-06 MED ORDER — LORAZEPAM 1 MG PO TABS: 0.0000 mg | ORAL_TABLET | Freq: Two times a day (BID) | ORAL | Status: DC

## 2019-06-06 MED ORDER — LORAZEPAM 1 MG PO TABS: 0.0000 mg | ORAL_TABLET | Freq: Four times a day (QID) | ORAL | Status: DC

## 2019-06-06 MED ORDER — CLONIDINE HCL 0.1 MG/24HR TD PTWK
0.1000 mg | MEDICATED_PATCH | TRANSDERMAL | Status: DC
  Administered 2019-06-06: 0.1 mg via TRANSDERMAL
  Filled 2019-06-06: qty 1

## 2019-06-06 MED ORDER — MORPHINE SULFATE (PF) 2 MG/ML IV SOLN: 2.0000 mg | Freq: Once | INTRAVENOUS | Status: DC

## 2019-06-06 MED ORDER — ACETAMINOPHEN 650 MG RE SUPP: 650.0000 mg | Freq: Four times a day (QID) | RECTAL | Status: DC | PRN

## 2019-06-06 MED ORDER — CHLORHEXIDINE GLUCONATE CLOTH 2 % EX PADS
6.0000 | MEDICATED_PAD | Freq: Every day | CUTANEOUS | Status: AC
  Administered 2019-06-06 – 2019-06-10 (×5): 6 via TOPICAL

## 2019-06-06 MED ORDER — HALOPERIDOL LACTATE 5 MG/ML IJ SOLN
2.0000 mg | Freq: Once | INTRAMUSCULAR | Status: AC
  Administered 2019-06-06: 21:00:00 2 mg via INTRAVENOUS
  Filled 2019-06-06: qty 1

## 2019-06-06 MED ORDER — LORAZEPAM 2 MG/ML IJ SOLN: 0.0000 mg | Freq: Two times a day (BID) | INTRAMUSCULAR | Status: DC

## 2019-06-06 MED ORDER — SODIUM CHLORIDE 0.9 % IV SOLN: INTRAVENOUS | Status: DC

## 2019-06-06 MED ORDER — VITAMIN B-1 100 MG PO TABS
100.0000 mg | ORAL_TABLET | Freq: Every day | ORAL | Status: DC
  Administered 2019-06-11 – 2019-06-14 (×4): 100 mg via ORAL
  Filled 2019-06-06 (×4): qty 1

## 2019-06-06 NOTE — H&P (Signed)
History and Physical    Sherry Baker DTO:[id number] DOB: [birth year] DOA: 06/05/2019  PCP: Patient, No Pcp Per  Patient coming from: Patient was brought in from the woods.  Chief Complaint: Altered mental status.  HPI: Sherry UNREIN is a 53 y.o. female with known history of rectal cancer status post colostomy with history of polysubstance abuse admitted last month July 15 through July 18 for altered mental status at the time patient was witnessed to have seizures likely precipitated by polysubstance abuse hypoglycemic episodes and work-up was largely unremarkable with MRI EEG being negative advised follow-up with neurologist was brought to the ER after patient was found to be altered mental status in the woods.  Most of the history obtained from ER physician and previous records and has no family was available and at the time of my exam patient is being sedated after being given Ativan.  ED Course: In the ER patient became alert awake and labs were at baseline.  EKG shows normal sinus rhythm chest x-ray unremarkable alcohol level is positive COVID test was negative and urine drug screen was positive for cocaine.  Once patient became alert awake plan was to discharge patient back and when patient became altered and had a seizure-like episode which was generalized tonic-clonic and became confused and was given Ativan 2 mg IV.  CT of the head was unremarkable on-call neurologist was consulted by the ER physician who advised at this time no neurology work-up is required.  Given the patient's ongoing confused state and seizure-like episode patient admitted for observation.  Review of Systems: As per HPI, rest all negative.   Past Medical History:  Diagnosis Date  . Cancer ([place])   . Depression     Past Surgical History:  Procedure Laterality Date  . COLON SURGERY       reports that she has been smoking. She has never used smokeless tobacco. She reports current alcohol use. She reports current  drug use. Drug: "Crack" cocaine.  Not on File  Family History  Problem Relation Age of Onset  . Cancer Mother     Prior to Admission medications   Not on File    Physical Exam: Constitutional: Moderately built and nourished. Vitals:   06/06/19 0145 06/06/19 0215 06/06/19 0230 06/06/19 0245  BP: (!) 198/106 (!) 203/118 (!) 204/138 (!) 197/107  Pulse: 93 94    Resp: 16 14 (!) 28 16  Temp:      TempSrc:      SpO2: 100% 100%     Eyes: Anicteric no pallor. ENMT: No discharge from the ears eyes nose and mouth. Neck: No mass felt.  No neck rigidity. Respiratory: No rhonchi or crepitations. Cardiovascular: S1-S2 heard. Abdomen: Soft nontender bowel sounds present. Musculoskeletal: No edema.  No joint effusion. Skin: No rash. Neurologic: Patient is lethargic pupils are reacting moves all extremities appears confused. Psychiatric: Appears confused.   Labs on Admission: I have personally reviewed following labs and imaging studies  CBC: Recent Labs  Lab 06/05/19 1832 06/06/19 0003  WBC 3.1* 4.5  NEUTROABS 2.1  --   HGB 10.4* 10.2*  HCT 33.4* 32.5*  MCV 90.5 90.3  PLT 190 983   Basic Metabolic Panel: Recent Labs  Lab 06/05/19 1832 06/06/19 0003  NA 139 143  K 3.9 3.8  CL 112* 114*  CO2 14* 18*  GLUCOSE 89 98  BUN 25* 23*  CREATININE 1.97* 1.84*  CALCIUM 8.7* 8.7*   GFR: CrCl cannot be calculated (Unknown  ideal weight.). Liver Function Tests: Recent Labs  Lab 06/05/19 1832  AST 27  ALT 15  ALKPHOS 77  BILITOT 0.6  PROT 7.4  ALBUMIN 3.5   Recent Labs  Lab 06/05/19 1832  LIPASE 56*   Recent Labs  Lab 06/06/19 0003  AMMONIA 35   Coagulation Profile: No results for input(s): INR, PROTIME in the last 168 hours. Cardiac Enzymes: No results for input(s): CKTOTAL, CKMB, CKMBINDEX, TROPONINI in the last 168 hours. BNP (last 3 results) No results for input(s): PROBNP in the last 8760 hours. HbA1C: No results for input(s): HGBA1C in the last 72  hours. CBG: Recent Labs  Lab 06/05/19 1826 06/06/19 0019 06/06/19 0232  GLUCAP 79 91 82   Lipid Profile: No results for input(s): CHOL, HDL, LDLCALC, TRIG, CHOLHDL, LDLDIRECT in the last 72 hours. Thyroid Function Tests: No results for input(s): TSH, T4TOTAL, FREET4, T3FREE, THYROIDAB in the last 72 hours. Anemia Panel: No results for input(s): VITAMINB12, FOLATE, FERRITIN, TIBC, IRON, RETICCTPCT in the last 72 hours. Urine analysis:    Component Value Date/Time   COLORURINE STRAW (A) 06/05/2019 2101   APPEARANCEUR CLEAR 06/05/2019 2101   LABSPEC 1.010 06/05/2019 2101   PHURINE 6.0 06/05/2019 2101   GLUCOSEU NEGATIVE 06/05/2019 2101   HGBUR SMALL (A) 06/05/2019 2101   BILIRUBINUR NEGATIVE 06/05/2019 2101   [place] NEGATIVE 06/05/2019 2101   PROTEINUR NEGATIVE 06/05/2019 2101   NITRITE NEGATIVE 06/05/2019 2101   LEUKOCYTESUR SMALL (A) 06/05/2019 2101   Sepsis Labs: @LABRCNTIP (procalcitonin:4,lacticidven:4) ) Recent Results (from the past 240 hour(s))  SARS Coronavirus 2 [facility] order, Performed in [facility] hospital lab) Nasopharyngeal Urine, Clean Catch     Status: None   Collection Time: 06/05/19  9:01 PM   Specimen: Urine, Clean Catch; Nasopharyngeal  Result Value Ref Range Status   SARS Coronavirus 2 NEGATIVE NEGATIVE Final    Comment: (NOTE) If result is NEGATIVE SARS-CoV-2 target nucleic acids are NOT DETECTED. The SARS-CoV-2 RNA is generally detectable in upper and lower  respiratory specimens during the acute phase of infection. The lowest  concentration of SARS-CoV-2 viral copies this assay can detect is 250  copies / mL. A negative result does not preclude SARS-CoV-2 infection  and should not be used as the sole basis for treatment or other  patient management decisions.  A negative result may occur with  improper specimen collection / handling, submission of specimen other  than nasopharyngeal swab, presence of viral mutation(s) within the  areas  targeted by this assay, and inadequate number of viral copies  (<250 copies / mL). A negative result must be combined with clinical  observations, patient history, and epidemiological information. If result is POSITIVE SARS-CoV-2 target nucleic acids are DETECTED. The SARS-CoV-2 RNA is generally detectable in upper and lower  respiratory specimens dur ing the acute phase of infection.  Positive  results are indicative of active infection with SARS-CoV-2.  Clinical  correlation with patient history and other diagnostic information is  necessary to determine patient infection status.  Positive results do  not rule out bacterial infection or co-infection with other viruses. If result is PRESUMPTIVE POSTIVE SARS-CoV-2 nucleic acids MAY BE PRESENT.   A presumptive positive result was obtained on the submitted specimen  and confirmed on repeat testing.  While 2019 novel coronavirus  (SARS-CoV-2) nucleic acids may be present in the submitted sample  additional confirmatory testing may be necessary for epidemiological  and / or clinical management purposes  to differentiate between  SARS-CoV-2 and other  Sarbecovirus currently known to infect humans.  If clinically indicated additional testing with an alternate test  methodology [phone]) is advised. The SARS-CoV-2 RNA is generally  detectable in upper and lower respiratory sp ecimens during the acute  phase of infection. The expected result is Negative. Fact Sheet for Patients:  StrictlyIdeas.no Fact Sheet for Healthcare Providers: BankingDealers.co.za This test is not yet approved or cleared by the [place] FDA and has been authorized for detection and/or diagnosis of SARS-CoV-2 by FDA under an Emergency Use Authorization (EUA).  This EUA will remain in effect (meaning this test can be used) for the duration of the COVID-19 declaration under Section 564(b)(1) of the Act, 21 U.S.C. section  360bbb-3(b)(1), unless the authorization is terminated or revoked sooner. Performed at [facility], [place] [address] [place], [place] [address]      Radiological Exams on Admission: Ct Head Wo Contrast  Result Date: 06/05/2019 CLINICAL DATA:  Altered level of consciousness EXAM: CT HEAD WITHOUT CONTRAST TECHNIQUE: Contiguous axial images were obtained from the base of the skull through the vertex without intravenous contrast. COMPARISON:  05/18/2019 FINDINGS: Brain: No acute intracranial abnormality. Specifically, no hemorrhage, hydrocephalus, mass lesion, acute infarction, or significant intracranial injury. Vascular: No hyperdense vessel or unexpected calcification. Skull: No acute calvarial abnormality. Sinuses/Orbits: Visualized paranasal sinuses and mastoids clear. Orbital soft tissues unremarkable. Other: None IMPRESSION: No acute intracranial abnormality. Electronically Signed   By: Rolm Baptise M.D.   On: 06/05/2019 19:02   Dg Chest Port 1 View  Result Date: 06/05/2019 CLINICAL DATA:  Altered mental status. EXAM: PORTABLE CHEST 1 VIEW COMPARISON:  05/18/2019 FINDINGS: Normal heart size. No pleural effusion. Pulmonary vascular congestion is new from previous exam. No airspace opacity. Visualized osseous structures are unremarkable. IMPRESSION: 1. Pulmonary vascular congestion, new from previous exam. Electronically Signed   By: Kerby Moors M.D.   On: 06/05/2019 19:19    EKG: Independently reviewed.  Normal sinus rhythm.  Assessment/Plan Principal Problem:   Acute encephalopathy Active Problems:   Hypertensive urgency   CKD (chronic kidney disease), stage III (HCC)   Normochromic normocytic anemia    1. Acute encephalopathy with seizure-like episodes -discussed with on-call neurology Dr. Malen Gauze who at this time advised no neurological work-up and no antiepileptic medications.  PRN Ativan for alcohol withdrawal.  Thiamine.  At this time patient's symptoms are likely from  polysubstance abuse and alcohol.  Has had recent admission for similar episode and at that time MRI brain EEG were unremarkable.  Neurology at this time recommended follow-up as outpatient with neurologist for EMU.  Given the ongoing lethargy patient admitted for further observation.  Since patient had hypoglycemic episodes previous admission last month will check CBGs closely. 2. Polysubstance abuse including alcohol and cocaine will need counseling when patient is more alert awake.  On CIWA. 3. Hypertensive urgency on PRN IV hydralazine for now.  Closely follow blood pressure trends. 4. Anemia appears to be chronic follow CBC. 5. Rectal cancer status post colostomy.   DVT prophylaxis: Lovenox. Code Status: Full code. Family Communication: Unable to reach any family. Disposition Plan: To be determined. Consults called: Discussed with neurology.  Social work. Admission status: Observation.   Rise Patience MD [facility] Pager [phone].  If 7PM-7AM, please contact night-coverage [email or web address] Password [email or web address]  06/06/2019, 3:53 AM

## 2019-06-06 NOTE — ED Notes (Signed)
Attempted to discharge pt when she started having erratic behavior such as shaking and not speaking. Grabbed MD to assess pt. Put pt back in bed. Orders placed

## 2019-06-06 NOTE — Progress Notes (Signed)
PROGRESS NOTE    Sherry Baker  ZOX:[id number] DOB: Nov 03, 1966 DOA: 06/05/2019 PCP: Patient, No Pcp Per    Brief Narrative:  Sherry Baker is a 53 y.o. female with known history of rectal cancer status post colostomy with history of polysubstance abuse admitted last month July 15 through July 18 for altered mental status at the time patient was witnessed to have seizures likely precipitated by polysubstance abuse hypoglycemic episodes and work-up was largely unremarkable with MRI EEG being negative advised follow-up with neurologist was brought to the ER after patient was found to be altered mental status in the woods.  Most of the history obtained from ER physician and previous records and has no family was available and at the time of my exam patient is being sedated after being given Ativan.  ED Course: In the ER patient became alert awake and labs were at baseline.  EKG shows normal sinus rhythm chest x-ray unremarkable alcohol level is positive COVID test was negative and urine drug screen was positive for cocaine.  Once patient became alert awake plan was to discharge patient back and when patient became altered and had a seizure-like episode which was generalized tonic-clonic and became confused and was given Ativan 2 mg IV.  CT of the head was unremarkable on-call neurologist was consulted by the ER physician who advised at this time no neurology work-up is required.  Given the patient's ongoing confused state and seizure-like episode patient admitted for observation.   Assessment & Plan:   Principal Problem:   Acute encephalopathy Active Problems:   Hypertensive urgency   CKD (chronic kidney disease), stage III (HCC)   Normochromic normocytic anemia   Pressure injury of skin   Acute metabolic encephalopathy Patient presenting after being found in the woods altered.  Recent hospitalization late July for similar episodes related to underlying substance abuse.  During that  hospitalization she was witnessed to have seizures likely participated by her substance abuse combined with hypoglycemic episodes.  Extensive work-up including MRI and EEG were unrevealing.  Case was discussed with on-call neurology, Dr. Malen Gauze this admission who advised no further neurological work-up and no anti-elliptic medications.  Ammonia level normal 35.  Elevated at 100.  UDS positive for cocaine.  Lactic acid 1.0.  CT head without acute disease process. --Continue supportive care --Monitor blood sugars closely with history of hypoglycemic episodes previously, given her current mental status --CIWAA protocol  Polysubstance abuse UDS positive for cocaine, EtOH level elevated 100 on admission. --CIWAA protocol --Thiamine, folic acid, multivitamin --Supportive care --Social work consult when more alert for discussion of rehab options; but likely will be noncompliant based on her history  Hypertensive urgency Blood pressure elevated to 218/124 admission.  Positive for cocaine on UDS. --Start clonidine 0.1 mg patch --Hydralazine IV prn  Anemia Hemoglobin 10.2 with MCV 90.3 on admission.  Stable.  No signs of blood loss.  History of rectal cancer status post colostomy --Wound care/ostomy consult  Pressure ulcer, present on admission --Sacral/gluteal cleft fissure noted --Wound care following    DVT prophylaxis: Lovenox Code Status: Full code Family Communication: None Disposition Plan: Continue observation status   Consultants:   Neurology - Dr. Malen Gauze  Procedures:   None  Antimicrobials:   None   Subjective: Patient seen and examined at bedside, continues with altered mental status with episodes of agitation.  Currently with mitts in place.  Continues with accelerated hypertension.  Unable to obtain any further ROS secondary to acute persistent encephalopathy.  No acute concerns overnight per nursing staff.  Objective: Vitals:   06/06/19 0600 06/06/19 0800  06/06/19 1000 06/06/19 1200  BP: (!) 182/106 (!) 193/101 (!) 169/109 (!) 182/108  Pulse: (!) 102 99 99 96  Resp: 17 14 18  (!) 27  Temp:      TempSrc:      SpO2: 97% 99% 97% 94%  Weight:      Height:        Intake/Output Summary (Last 24 hours) at 06/06/2019 1332 Last data filed at 06/06/2019 1200 Gross per 24 hour  Intake 1064.43 ml  Output -  Net 1064.43 ml   Filed Weights   06/06/19 0500  Weight: 49.4 kg    Examination:  General exam: Altered with episodes of agitation Respiratory system: Clear to auscultation. Respiratory effort normal. Cardiovascular system: S1 & S2 heard, RRR. No JVD, murmurs, rubs, gallops or clicks. No pedal edema. Gastrointestinal system: Abdomen is nondistended, soft and nontender. No organomegaly or masses felt. Normal bowel sounds heard.  Colostomy noted with brown stool in bag Central nervous system: Altered, moves all extremities independently Extremities: Symmetric 5 x 5 power. Skin: No rashes, lesions or ulcers Psychiatry: Altered with episodes of agitation    Data Reviewed: I have personally reviewed following labs and imaging studies  CBC: Recent Labs  Lab 06/05/19 1832 06/06/19 0003  WBC 3.1* 4.5  NEUTROABS 2.1  --   HGB 10.4* 10.2*  HCT 33.4* 32.5*  MCV 90.5 90.3  PLT 190 993   Basic Metabolic Panel: Recent Labs  Lab 06/05/19 1832 06/06/19 0003 06/06/19 0820  NA 139 143  --   K 3.9 3.8  --   CL 112* 114*  --   CO2 14* 18*  --   GLUCOSE 89 98  --   BUN 25* 23*  --   CREATININE 1.97* 1.84*  --   CALCIUM 8.7* 8.7*  --   MG  --   --  2.3   GFR: Estimated Creatinine Clearance: 28.2 mL/min (A) (by C-G formula based on SCr of 1.84 mg/dL (H)). Liver Function Tests: Recent Labs  Lab 06/05/19 1832  AST 27  ALT 15  ALKPHOS 77  BILITOT 0.6  PROT 7.4  ALBUMIN 3.5   Recent Labs  Lab 06/05/19 1832  LIPASE 56*   Recent Labs  Lab 06/06/19 0003  AMMONIA 35   Coagulation Profile: No results for input(s): INR,  PROTIME in the last 168 hours. Cardiac Enzymes: No results for input(s): CKTOTAL, CKMB, CKMBINDEX, TROPONINI in the last 168 hours. BNP (last 3 results) No results for input(s): PROBNP in the last 8760 hours. HbA1C: No results for input(s): HGBA1C in the last 72 hours. CBG: Recent Labs  Lab 06/05/19 1826 06/06/19 0019 06/06/19 0232  GLUCAP 79 91 82   Lipid Profile: No results for input(s): CHOL, HDL, LDLCALC, TRIG, CHOLHDL, LDLDIRECT in the last 72 hours. Thyroid Function Tests: No results for input(s): TSH, T4TOTAL, FREET4, T3FREE, THYROIDAB in the last 72 hours. Anemia Panel: No results for input(s): VITAMINB12, FOLATE, FERRITIN, TIBC, IRON, RETICCTPCT in the last 72 hours. Sepsis Labs: Recent Labs  Lab 06/06/19 0004  LATICACIDVEN 1.0    Recent Results (from the past 240 hour(s))  SARS Coronavirus 2 [facility] order, Performed in [facility] hospital lab) Nasopharyngeal Urine, Clean Catch     Status: None   Collection Time: 06/05/19  9:01 PM   Specimen: Urine, Clean Catch; Nasopharyngeal  Result Value Ref Range Status   SARS Coronavirus 2 NEGATIVE  NEGATIVE Final    Comment: (NOTE) If result is NEGATIVE SARS-CoV-2 target nucleic acids are NOT DETECTED. The SARS-CoV-2 RNA is generally detectable in upper and lower  respiratory specimens during the acute phase of infection. The lowest  concentration of SARS-CoV-2 viral copies this assay can detect is 250  copies / mL. A negative result does not preclude SARS-CoV-2 infection  and should not be used as the sole basis for treatment or other  patient management decisions.  A negative result may occur with  improper specimen collection / handling, submission of specimen other  than nasopharyngeal swab, presence of viral mutation(s) within the  areas targeted by this assay, and inadequate number of viral copies  (<250 copies / mL). A negative result must be combined with clinical  observations, patient history, and  epidemiological information. If result is POSITIVE SARS-CoV-2 target nucleic acids are DETECTED. The SARS-CoV-2 RNA is generally detectable in upper and lower  respiratory specimens dur ing the acute phase of infection.  Positive  results are indicative of active infection with SARS-CoV-2.  Clinical  correlation with patient history and other diagnostic information is  necessary to determine patient infection status.  Positive results do  not rule out bacterial infection or co-infection with other viruses. If result is PRESUMPTIVE POSTIVE SARS-CoV-2 nucleic acids MAY BE PRESENT.   A presumptive positive result was obtained on the submitted specimen  and confirmed on repeat testing.  While 2019 novel coronavirus  (SARS-CoV-2) nucleic acids may be present in the submitted sample  additional confirmatory testing may be necessary for epidemiological  and / or clinical management purposes  to differentiate between  SARS-CoV-2 and other Sarbecovirus currently known to infect humans.  If clinically indicated additional testing with an alternate test  methodology [phone]) is advised. The SARS-CoV-2 RNA is generally  detectable in upper and lower respiratory sp ecimens during the acute  phase of infection. The expected result is Negative. Fact Sheet for Patients:  StrictlyIdeas.no Fact Sheet for Healthcare Providers: BankingDealers.co.za This test is not yet approved or cleared by the [place] FDA and has been authorized for detection and/or diagnosis of SARS-CoV-2 by FDA under an Emergency Use Authorization (EUA).  This EUA will remain in effect (meaning this test can be used) for the duration of the COVID-19 declaration under Section 564(b)(1) of the Act, 21 U.S.C. section 360bbb-3(b)(1), unless the authorization is terminated or revoked sooner. Performed at [facility], [place] [address], [place], [place] [address]   MRSA PCR  Screening     Status: Abnormal   Collection Time: 06/06/19  5:47 AM   Specimen: Nasopharyngeal  Result Value Ref Range Status   MRSA by PCR POSITIVE (A) NEGATIVE Final    Comment:        The GeneXpert MRSA Assay (FDA approved for NASAL specimens only), is one component of a comprehensive MRSA colonization surveillance program. It is not intended to diagnose MRSA infection nor to guide or monitor treatment for MRSA infections. CRITICAL RESULT CALLED TO, READ BACK BY AND VERIFIED WITH: RN K CHARAPLIWY [id number] AT 26 AM BY CM Performed at [facility], [place] [address], [place],  [address]          Radiology Studies: Ct Head Wo Contrast  Result Date: 06/05/2019 CLINICAL DATA:  Altered level of consciousness EXAM: CT HEAD WITHOUT CONTRAST TECHNIQUE: Contiguous axial images were obtained from the base of the skull through the vertex without intravenous contrast. COMPARISON:  05/18/2019 FINDINGS: Brain: No  acute intracranial abnormality. Specifically, no hemorrhage, hydrocephalus, mass lesion, acute infarction, or significant intracranial injury. Vascular: No hyperdense vessel or unexpected calcification. Skull: No acute calvarial abnormality. Sinuses/Orbits: Visualized paranasal sinuses and mastoids clear. Orbital soft tissues unremarkable. Other: None IMPRESSION: No acute intracranial abnormality. Electronically Signed   By: Rolm Baptise M.D.   On: 06/05/2019 19:02   Dg Chest Port 1 View  Result Date: 06/05/2019 CLINICAL DATA:  Altered mental status. EXAM: PORTABLE CHEST 1 VIEW COMPARISON:  05/18/2019 FINDINGS: Normal heart size. No pleural effusion. Pulmonary vascular congestion is new from previous exam. No airspace opacity. Visualized osseous structures are unremarkable. IMPRESSION: 1. Pulmonary vascular congestion, new from previous exam. Electronically Signed   By: Kerby Moors M.D.   On: 06/05/2019 19:19        Scheduled Meds: . Chlorhexidine Gluconate Cloth  6  each Topical Q0600  . cloNIDine  0.1 mg Transdermal Weekly  . enoxaparin (LOVENOX) injection  40 mg Subcutaneous Daily  . folic acid  1 mg Oral Daily  . LORazepam  0-4 mg Intravenous Q6H   Followed by  . [START ON 06/08/2019] LORazepam  0-4 mg Intravenous Q12H  . mouth rinse  15 mL Mouth Rinse BID  . multivitamin with minerals  1 tablet Oral Daily  . mupirocin ointment  1 application Nasal BID  . thiamine  100 mg Oral Daily   Or  . thiamine  100 mg Intravenous Daily   Continuous Infusions: . dextrose 5 % and 0.45% NaCl 75 mL/hr at 06/06/19 1116     LOS: 0 days    Time spent: 38 minutes spent on chart review, personally reviewing all imaging/laboratory studies, discussion with nursing staff, consultants, updating family and interview/physical exam; more than 50% of that time was spent in counseling and/or coordination of care.    Larrell Rapozo J [place] DO [facility] Pager [phone]  If 7PM-7AM, please contact night-coverage [email or web address] Password [email or web address] 06/06/2019, 1:32 PM

## 2019-06-06 NOTE — Consult Note (Signed)
[place] Nurse wound consult note Reason for Consult: sacral pressure injury  Wound type: non healing surgical wound; apparent APR with closure of the rectum which is now a non healing wound  Fissured area just proximal in the apex of the gluteal cleft; most likely from moisture from constant moist gauze on the other site.  Pressure Injury POA: NA Measurement:3cm x 3cm x 0.1cm Wound bed: pale, pink, non granular, non healing Drainage (amount, consistency, odor) minimal, no odor Periwound: intact, with fissured area as described above  Dressing procedure/placement/frequency: Saline moist dressing daily.     [place] Nurse ostomy consult note Stoma type/location: LLQ, end colostomy Stomal assessment/size: pouch intact and patient is not arouses in current state ? Post ictal ? Alcohol withdrawls/meds    Peristomal assessment: NA Treatment options for stomal/peristomal skin: NA Output semi formed brown stool  Ostomy pouching: 1pc flat .  Education provided: [employer] ordered for bedside nurse to use or for patient to change pouch.   Discussed POC with patient and bedside nurse.  Re consult if needed, will not follow at this time. Thanks  Mckenzi Buonomo [employer], RN,CWOCN, CNS, [place] [phone])

## 2019-06-06 NOTE — Progress Notes (Signed)
Discussed over phone with Dr. Stark Jock. 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 Currently alcohol and cocaine positive, with bizarre behavior presentation - likely secondary to alcohol and cocaine abuse - similar presentation to last time. No further inpatient neurological work up needed from my discussion with Dr. Stark Jock Will need outpatient neurology follow up and possible EMU admission as an outpatient as advised in the last neurological consulatation in the chart MRN: [id number] Please call with questions   -- Amie [place], MD [facility] Pager: [phone]  If 7pm to 7am, please call on call as listed on AMION.

## 2019-06-06 NOTE — Progress Notes (Addendum)
Pt trying to sit up in bed and biting at mits. Pt remains confused despite trying to reorient.  Ativan 2mg  given. MD paged to address increased agitation and confusion. MD ordered to give ativan prn, 1mg  given. Will continue to monitor.

## 2019-06-07 DIAGNOSIS — G9340 Encephalopathy, unspecified: Secondary | ICD-10-CM

## 2019-06-07 LAB — CBC
HCT: 34.8 % — ABNORMAL LOW (ref 36.0–46.0)
Hemoglobin: 11.1 g/dL — ABNORMAL LOW (ref 12.0–15.0)
MCH: 28.5 pg (ref 26.0–34.0)
MCHC: 31.9 g/dL (ref 30.0–36.0)
MCV: 89.2 fL (ref 80.0–100.0)
Platelets: 177 10*3/uL (ref 150–400)
RBC: 3.9 MIL/uL (ref 3.87–5.11)
RDW: 17.7 % — ABNORMAL HIGH (ref 11.5–15.5)
WBC: 2.7 10*3/uL — ABNORMAL LOW (ref 4.0–10.5)
nRBC: 0 % (ref 0.0–0.2)

## 2019-06-07 LAB — POTASSIUM: Potassium: 3.9 mmol/L (ref 3.5–5.1)

## 2019-06-07 LAB — MAGNESIUM: Magnesium: 2.1 mg/dL (ref 1.7–2.4)

## 2019-06-07 LAB — BASIC METABOLIC PANEL
Anion gap: 12 (ref 5–15)
BUN: 22 mg/dL — ABNORMAL HIGH (ref 6–20)
CO2: 19 mmol/L — ABNORMAL LOW (ref 22–32)
Calcium: 9.4 mg/dL (ref 8.9–10.3)
Chloride: 113 mmol/L — ABNORMAL HIGH (ref 98–111)
Creatinine, Ser: 1.57 mg/dL — ABNORMAL HIGH (ref 0.44–1.00)
GFR calc Af Amer: 44 mL/min — ABNORMAL LOW (ref >60)
GFR calc non Af Amer: 38 mL/min — ABNORMAL LOW (ref >60)
Glucose, Bld: 99 mg/dL (ref 70–99)
Potassium: 3 mmol/L — ABNORMAL LOW (ref 3.5–5.1)
Sodium: 144 mmol/L (ref 135–145)

## 2019-06-07 LAB — GLUCOSE, CAPILLARY: Glucose-Capillary: 90 mg/dL (ref 70–99)

## 2019-06-07 MED ORDER — CLONIDINE HCL 0.2 MG/24HR TD PTWK: 0.2000 mg | MEDICATED_PATCH | TRANSDERMAL | Status: DC

## 2019-06-07 MED ORDER — POTASSIUM CHLORIDE 10 MEQ/100ML IV SOLN
10.0000 meq | INTRAVENOUS | Status: AC
  Administered 2019-06-07 (×6): 10 meq via INTRAVENOUS
  Filled 2019-06-07 (×6): qty 100

## 2019-06-07 MED ORDER — LORAZEPAM 2 MG/ML IJ SOLN
3.0000 mg | Freq: Four times a day (QID) | INTRAMUSCULAR | Status: DC
  Administered 2019-06-07 – 2019-06-09 (×8): 3 mg via INTRAVENOUS
  Filled 2019-06-07 (×8): qty 2

## 2019-06-07 MED ORDER — LORAZEPAM 2 MG/ML IJ SOLN
2.0000 mg | Freq: Four times a day (QID) | INTRAMUSCULAR | Status: DC
  Administered 2019-06-07: 2 mg via INTRAVENOUS
  Filled 2019-06-07: qty 1

## 2019-06-07 MED ORDER — FOLIC ACID 5 MG/ML IJ SOLN
1.0000 mg | Freq: Every day | INTRAMUSCULAR | Status: DC
  Administered 2019-06-07 – 2019-06-10 (×3): 1 mg via INTRAVENOUS
  Filled 2019-06-07 (×6): qty 0.2

## 2019-06-07 MED ORDER — CYANOCOBALAMIN 1000 MCG/ML IJ SOLN
1000.0000 ug | Freq: Once | INTRAMUSCULAR | Status: AC
  Administered 2019-06-07: 1000 ug via INTRAMUSCULAR
  Filled 2019-06-07: qty 1

## 2019-06-07 MED ORDER — HALOPERIDOL LACTATE 5 MG/ML IJ SOLN
2.0000 mg | Freq: Four times a day (QID) | INTRAMUSCULAR | Status: DC | PRN
  Administered 2019-06-07 – 2019-06-12 (×8): 2 mg via INTRAVENOUS
  Filled 2019-06-07 (×8): qty 1

## 2019-06-07 MED ORDER — LORAZEPAM 2 MG/ML IJ SOLN: 2.0000 mg | Freq: Four times a day (QID) | INTRAMUSCULAR | Status: DC

## 2019-06-07 MED ORDER — LORAZEPAM 2 MG/ML IJ SOLN
4.0000 mg | Freq: Once | INTRAMUSCULAR | Status: AC
  Administered 2019-06-07: 4 mg via INTRAVENOUS
  Filled 2019-06-07: qty 2

## 2019-06-07 NOTE — Progress Notes (Signed)
PROGRESS NOTE    Sherry Baker  ULA:[id number] DOB: 28-Aug-1966 DOA: 06/05/2019 PCP: Patient, No Pcp Per    Brief Narrative:  Sherry Baker is a 53 y.o. female with known history of rectal cancer status post colostomy with history of polysubstance abuse admitted last month July 15 through July 18 for altered mental status at the time patient was witnessed to have seizures likely precipitated by polysubstance abuse hypoglycemic episodes and work-up was largely unremarkable with MRI EEG being negative advised follow-up with neurologist was brought to the ER after patient was found to be altered mental status in the woods.  Most of the history obtained from ER physician and previous records and has no family was available and at the time of my exam patient is being sedated after being given Ativan.  ED Course: In the ER patient became alert awake and labs were at baseline.  EKG shows normal sinus rhythm chest x-ray unremarkable alcohol level is positive COVID test was negative and urine drug screen was positive for cocaine.  Once patient became alert awake plan was to discharge patient back and when patient became altered and had a seizure-like episode which was generalized tonic-clonic and became confused and was given Ativan 2 mg IV.  CT of the head was unremarkable on-call neurologist was consulted by the ER physician who advised at this time no neurology work-up is required.  Given the patient's ongoing confused state and seizure-like episode patient admitted for observation.   Assessment & Plan:   Principal Problem:   Acute encephalopathy Active Problems:   Hypertensive urgency   CKD (chronic kidney disease), stage III (HCC)   Normochromic normocytic anemia   Pressure injury of skin   Acute metabolic encephalopathy   Acute metabolic encephalopathy EtOH withdrawal Patient presenting after being found in the woods altered.  Recent hospitalization late July for similar episodes related to  underlying substance abuse.  During that hospitalization she was witnessed to have seizures likely participated by her substance abuse combined with hypoglycemic episodes.  Extensive work-up including MRI and EEG were unrevealing.  Case was discussed with on-call neurology, Dr. Malen Gauze this admission who advised no further neurological work-up and no anti-elliptic medications.  Ammonia level normal 35.  Elevated at 100.  UDS positive for cocaine.  Lactic acid 1.0.  CT head without acute disease process. --PCCM consulted for assistance given her significant withdrawals for consideration of ICU level of care and Precedex; appreciate assistance --Monitor blood sugars closely with history of hypoglycemic episodes previously, given her current mental status --CIWAA protocol --Ativan 2 mg every 6 hours scheduled --Haldol 2 mg IV q6h prn for severe agitation --B12, iron, thiamine, folate, MVI  Polysubstance abuse UDS positive for cocaine, EtOH level elevated 100 on admission. --CIWAA protocol --Thiamine, folic acid, multivitamin --Supportive care --Social work consult when more alert for discussion of rehab options; but likely will be noncompliant based on her history  Hypertensive urgency Blood pressure elevated to 218/124 admission.  Positive for cocaine on UDS. --Increase clonidine to 0.2 mg patch --Hydralazine IV prn  Anemia Hemoglobin 10.2 with MCV 90.3 on admission.  Stable.  No signs of blood loss.  History of rectal cancer status post colostomy --Wound care/ostomy consult  Pressure ulcer, present on admission --Sacral/gluteal cleft fissure noted --Wound care following  Hypokalemia Potassium 3.0 this morning.  Will replete --Repeat electrolytes to include magnesium in the a.m.    DVT prophylaxis: Lovenox Code Status: Full code Family Communication: None Disposition Plan: Continue status,  further depending on clinical course   Consultants:   Neurology - Dr. Malen Gauze  PCCM -  Dr. Tamala Julian  Procedures:   None  Antimicrobials:   None   Subjective: Patient seen and examined at bedside, continues with altered mental status with episodes of agitation.  Received 5 doses of IV Ativan over the past 24 hours.  Also received dose of Haldol overnight.  Currently with mitts in place.  Continues with accelerated hypertension.  Unable to obtain any further ROS secondary to acute persistent encephalopathy.  Nursing concerned regarding high acuity and attention needed for this patient.  PCCM now following.  No other acute concerns overnight per nursing staff.  Objective: Vitals:   06/07/19 0500 06/07/19 0700 06/07/19 0900 06/07/19 1128  BP:      Pulse: 79 79  (!) 116  Resp: 15 14 19    Temp:      TempSrc:      SpO2: 96% 100%    Weight:      Height:        Intake/Output Summary (Last 24 hours) at 06/07/2019 1318 Last data filed at 06/07/2019 0800 Gross per 24 hour  Intake 432.49 ml  Output --  Net 432.49 ml   Filed Weights   06/06/19 0500 06/07/19 0327  Weight: 49.4 kg 49.4 kg    Examination:  General exam: Altered with episodes of agitation Respiratory system: Clear to auscultation. Respiratory effort normal.  On room air Cardiovascular system: S1 & S2 heard, RRR. No JVD, murmurs, rubs, gallops or clicks. No pedal edema. Gastrointestinal system: Abdomen is nondistended, soft and nontender. No organomegaly or masses felt. Normal bowel sounds heard.  Colostomy noted with brown stool in bag Central nervous system: Altered, moves all extremities independently Extremities: Symmetric 5 x 5 power. Skin: No rashes, lesions or ulcers Psychiatry: Altered with episodes of agitation    Data Reviewed: I have personally reviewed following labs and imaging studies  CBC: Recent Labs  Lab 06/05/19 1832 06/06/19 0003 06/07/19 0516  WBC 3.1* 4.5 2.7*  NEUTROABS 2.1  --   --   HGB 10.4* 10.2* 11.1*  HCT 33.4* 32.5* 34.8*  MCV 90.5 90.3 89.2  PLT 190 203 683   Basic  Metabolic Panel: Recent Labs  Lab 06/05/19 1832 06/06/19 0003 06/06/19 0820 06/07/19 0516  NA 139 143  --  144  K 3.9 3.8  --  3.0*  CL 112* 114*  --  113*  CO2 14* 18*  --  19*  GLUCOSE 89 98  --  99  BUN 25* 23*  --  22*  CREATININE 1.97* 1.84*  --  1.57*  CALCIUM 8.7* 8.7*  --  9.4  MG  --   --  2.3 2.1   GFR: Estimated Creatinine Clearance: 33.1 mL/min (A) (by C-G formula based on SCr of 1.57 mg/dL (H)). Liver Function Tests: Recent Labs  Lab 06/05/19 1832  AST 27  ALT 15  ALKPHOS 77  BILITOT 0.6  PROT 7.4  ALBUMIN 3.5   Recent Labs  Lab 06/05/19 1832  LIPASE 56*   Recent Labs  Lab 06/06/19 0003  AMMONIA 35   Coagulation Profile: No results for input(s): INR, PROTIME in the last 168 hours. Cardiac Enzymes: No results for input(s): CKTOTAL, CKMB, CKMBINDEX, TROPONINI in the last 168 hours. BNP (last 3 results) No results for input(s): PROBNP in the last 8760 hours. HbA1C: No results for input(s): HGBA1C in the last 72 hours. CBG: Recent Labs  Lab 06/05/19 1826 06/06/19  0019 06/06/19 0232 06/07/19 1007  GLUCAP 79 91 82 90   Lipid Profile: No results for input(s): CHOL, HDL, LDLCALC, TRIG, CHOLHDL, LDLDIRECT in the last 72 hours. Thyroid Function Tests: No results for input(s): TSH, T4TOTAL, FREET4, T3FREE, THYROIDAB in the last 72 hours. Anemia Panel: No results for input(s): VITAMINB12, FOLATE, FERRITIN, TIBC, IRON, RETICCTPCT in the last 72 hours. Sepsis Labs: Recent Labs  Lab 06/06/19 0004  LATICACIDVEN 1.0    Recent Results (from the past 240 hour(s))  SARS Coronavirus 2 [facility] order, Performed in [facility] hospital lab) Nasopharyngeal Urine, Clean Catch     Status: None   Collection Time: 06/05/19  9:01 PM   Specimen: Urine, Clean Catch; Nasopharyngeal  Result Value Ref Range Status   SARS Coronavirus 2 NEGATIVE NEGATIVE Final    Comment: (NOTE) If result is NEGATIVE SARS-CoV-2 target nucleic acids are NOT DETECTED. The  SARS-CoV-2 RNA is generally detectable in upper and lower  respiratory specimens during the acute phase of infection. The lowest  concentration of SARS-CoV-2 viral copies this assay can detect is 250  copies / mL. A negative result does not preclude SARS-CoV-2 infection  and should not be used as the sole basis for treatment or other  patient management decisions.  A negative result may occur with  improper specimen collection / handling, submission of specimen other  than nasopharyngeal swab, presence of viral mutation(s) within the  areas targeted by this assay, and inadequate number of viral copies  (<250 copies / mL). A negative result must be combined with clinical  observations, patient history, and epidemiological information. If result is POSITIVE SARS-CoV-2 target nucleic acids are DETECTED. The SARS-CoV-2 RNA is generally detectable in upper and lower  respiratory specimens dur ing the acute phase of infection.  Positive  results are indicative of active infection with SARS-CoV-2.  Clinical  correlation with patient history and other diagnostic information is  necessary to determine patient infection status.  Positive results do  not rule out bacterial infection or co-infection with other viruses. If result is PRESUMPTIVE POSTIVE SARS-CoV-2 nucleic acids MAY BE PRESENT.   A presumptive positive result was obtained on the submitted specimen  and confirmed on repeat testing.  While 2019 novel coronavirus  (SARS-CoV-2) nucleic acids may be present in the submitted sample  additional confirmatory testing may be necessary for epidemiological  and / or clinical management purposes  to differentiate between  SARS-CoV-2 and other Sarbecovirus currently known to infect humans.  If clinically indicated additional testing with an alternate test  methodology [phone]) is advised. The SARS-CoV-2 RNA is generally  detectable in upper and lower respiratory sp ecimens during the acute    phase of infection. The expected result is Negative. Fact Sheet for Patients:  StrictlyIdeas.no Fact Sheet for Healthcare Providers: BankingDealers.co.za This test is not yet approved or cleared by the [place] FDA and has been authorized for detection and/or diagnosis of SARS-CoV-2 by FDA under an Emergency Use Authorization (EUA).  This EUA will remain in effect (meaning this test can be used) for the duration of the COVID-19 declaration under Section 564(b)(1) of the Act, 21 U.S.C. section 360bbb-3(b)(1), unless the authorization is terminated or revoked sooner. Performed at [facility], [place] [address], [place], [place] [address]   MRSA PCR Screening     Status: Abnormal   Collection Time: 06/06/19  5:47 AM   Specimen: Nasopharyngeal  Result Value Ref Range Status   MRSA by PCR POSITIVE (A) NEGATIVE Final  Comment:        The GeneXpert MRSA Assay (FDA approved for NASAL specimens only), is one component of a comprehensive MRSA colonization surveillance program. It is not intended to diagnose MRSA infection nor to guide or monitor treatment for MRSA infections. CRITICAL RESULT CALLED TO, READ BACK BY AND VERIFIED WITH: RN K CHARAPLIWY [id number] AT 67 AM BY CM Performed at [facility], [place] [address] [place], [place] [address]          Radiology Studies: Ct Head Wo Contrast  Result Date: 06/05/2019 CLINICAL DATA:  Altered level of consciousness EXAM: CT HEAD WITHOUT CONTRAST TECHNIQUE: Contiguous axial images were obtained from the base of the skull through the vertex without intravenous contrast. COMPARISON:  05/18/2019 FINDINGS: Brain: No acute intracranial abnormality. Specifically, no hemorrhage, hydrocephalus, mass lesion, acute infarction, or significant intracranial injury. Vascular: No hyperdense vessel or unexpected calcification. Skull: No acute calvarial abnormality. Sinuses/Orbits: Visualized  paranasal sinuses and mastoids clear. Orbital soft tissues unremarkable. Other: None IMPRESSION: No acute intracranial abnormality. Electronically Signed   By: Rolm Baptise M.D.   On: 06/05/2019 19:02   Dg Chest Port 1 View  Result Date: 06/05/2019 CLINICAL DATA:  Altered mental status. EXAM: PORTABLE CHEST 1 VIEW COMPARISON:  05/18/2019 FINDINGS: Normal heart size. No pleural effusion. Pulmonary vascular congestion is new from previous exam. No airspace opacity. Visualized osseous structures are unremarkable. IMPRESSION: 1. Pulmonary vascular congestion, new from previous exam. Electronically Signed   By: Kerby Moors M.D.   On: 06/05/2019 19:19        Scheduled Meds:  Chlorhexidine Gluconate Cloth  6 each Topical Q0600   [START ON 06/13/2019] cloNIDine  0.2 mg Transdermal Weekly   enoxaparin (LOVENOX) injection  40 mg Subcutaneous Daily   folic acid  1 mg Intravenous Daily   LORazepam  0-4 mg Intravenous Q6H   Followed by   Derrill Memo ON 06/08/2019] LORazepam  0-4 mg Intravenous Q12H   LORazepam  2 mg Intravenous Q6H   mouth rinse  15 mL Mouth Rinse BID   multivitamin with minerals  1 tablet Oral Daily   mupirocin ointment  1 application Nasal BID   thiamine  100 mg Oral Daily   Or   thiamine  100 mg Intravenous Daily   Continuous Infusions:  dextrose 5 % and 0.45% NaCl 75 mL/hr at 06/07/19 0113   potassium chloride 10 mEq (06/07/19 1244)     LOS: 1 day    Time spent: 42 minutes spent on chart review, personally reviewing all imaging/laboratory studies, discussion with nursing staff, consultants, updating family and interview/physical exam; more than 50% of that time was spent in counseling and/or coordination of care.    Paije Goodhart J [place] DO [facility] Pager [phone]  If 7PM-7AM, please contact night-coverage [email or web address] Password [email or web address] 06/07/2019, 1:18 PM

## 2019-06-07 NOTE — Progress Notes (Signed)
Pt has been agitated and combative since beginning of shift. Pt continuously attempted to sit up in the bed and bite her mits while pulling on wires. Multiple attempts to reorient her and keep her safe. Attending notified. BP and HR were elevated. Gave her 10 mg hydralazine, 5 mg metoprolol, 2mg  haldol. Did not seem to help. Pt is on BL soft wrist restraints per order. Four side rails up, bed at the lowest position, call light within reach. Will closely monitor.   Fransico Michael, RN

## 2019-06-07 NOTE — Consult Note (Signed)
NAME:  Sherry Baker, MRN:  [id number], DOB:  [birth year], LOS: 1 ADMISSION DATE:  06/05/2019, CONSULTATION DATE:  06/07/2019 REFERRING MD:  [place] CHIEF COMPLAINT:  AMS   Brief History   53 year old woman with hx of polysubstance abuse found in woods altered.  UDS with cocaine, EtOH 100.  PCCM consulted for help managing withdrawal.  History of present illness   53 year old woman with hx of polysubstance abuse, likely withdrawal seizures found in woods altered.  UDS with cocaine, EtOH 100.  Had seizure-like episode in ER that ablated with ativan.  Receiving CIWA on floor but still agitated.  PCCM consulted for help managing withdrawal.  History per chart review as patient is not making sense.  Past Medical History  -Rectal cancer with resection and ostomy, also got concurrent chemoradiation -HTN -Bipolar 1 -Depression -Crack cocaine abuse -Alcohol abuse - CKD -Seizures thought related to substance abuse -?EtOH Cirrhosis, labs not really c/w this  Significant Hospital Events   06/05/19 admitted  Consults:  PCCM  Procedures:  CT head 8/2 benign CXR 8/2  Significant Diagnostic Tests:  Ammonia 35  Micro Data:  NA  Antimicrobials:  NA   Interim history/subjective:  Patient thrashing around in bed.  Objective   Blood pressure (!) 188/97, pulse 79, temperature 97.6 F (36.4 C), temperature source Axillary, resp. rate 19, height 5\' 2"  (1.575 m), weight 49.4 kg, SpO2 100 %.        Intake/Output Summary (Last 24 hours) at 06/07/2019 0955 Last data filed at 06/07/2019 0800 Gross per 24 hour  Intake 486.92 ml  Output -  Net 486.92 ml   Filed Weights   06/06/19 0500 06/07/19 0327  Weight: 49.4 kg 49.4 kg    Examination: General: agitated chronically ill woman appears older than stated age HENT: MM dry, no thrush Lungs: Clear, no accessory muscle use Cardiovascular: Tachycardic, bounding pulses Abdomen: Soft, +BS Extremities: no edema Neuro: moves all 4 ext  spontaneously Skin: no jaundice  Resolved Hospital Problem list   NA  Assessment & Plan:  # Acute delirium- in setting of EtOH and cocaine in system c/w withdrawal +/- postictal state.  Complicated by baseline bipolar and poor social situation.  There was a question of Wernicke's raised at last admission. # Pancytopenia- her B12 was pretty low last admission as was her ferritin # CKD- Cr about at baseline 1.5-2 # Hx rectal cancer # HTN  - Ativan 4mg  x 1, start 2mg  q6h standing - Haldol is a useful adjunctive, if patient does become lucid enough for PO, standing seroquel would be preferable - Re-orient as able, soft restraints - Give her time and taper off benzos then she should return to baseline - B12 shot ordered, needs this + iron + thiamine + folate as OP - No ICU at this time but can consider if she needs so much ativan that it impairs her resp drive, gave charge nurse my cell to call PRN  Best practice:  Diet: NPO Pain/Anxiety/Delirium protocol (if indicated): See above VAP protocol (if indicated): NA DVT prophylaxis: lovenox GI prophylaxis: NA Glucose control: NA Mobility: bedrest Code Status: full Family Communication: per primary Disposition: PCU for now  Labs   CBC: Recent Labs  Lab 06/05/19 1832 06/06/19 0003 06/07/19 0516  WBC 3.1* 4.5 2.7*  NEUTROABS 2.1  --   --   HGB 10.4* 10.2* 11.1*  HCT 33.4* 32.5* 34.8*  MCV 90.5 90.3 89.2  PLT 190 203 177  Basic Metabolic Panel: Recent Labs  Lab 06/05/19 1832 06/06/19 0003 06/06/19 0820 06/07/19 0516  NA 139 143  --  144  K 3.9 3.8  --  3.0*  CL 112* 114*  --  113*  CO2 14* 18*  --  19*  GLUCOSE 89 98  --  99  BUN 25* 23*  --  22*  CREATININE 1.97* 1.84*  --  1.57*  CALCIUM 8.7* 8.7*  --  9.4  MG  --   --  2.3 2.1   GFR: Estimated Creatinine Clearance: 33.1 mL/min (A) (by C-G formula based on SCr of 1.57 mg/dL (H)). Recent Labs  Lab 06/05/19 1832 06/06/19 0003 06/06/19 0004 06/07/19 0516   WBC 3.1* 4.5  --  2.7*  LATICACIDVEN  --   --  1.0  --     Liver Function Tests: Recent Labs  Lab 06/05/19 1832  AST 27  ALT 15  ALKPHOS 77  BILITOT 0.6  PROT 7.4  ALBUMIN 3.5   Recent Labs  Lab 06/05/19 1832  LIPASE 56*   Recent Labs  Lab 06/06/19 0003  AMMONIA 35    ABG No results found for: PHART, PCO2ART, PO2ART, HCO3, TCO2, ACIDBASEDEF, O2SAT   Coagulation Profile: No results for input(s): INR, PROTIME in the last 168 hours.  Cardiac Enzymes: No results for input(s): CKTOTAL, CKMB, CKMBINDEX, TROPONINI in the last 168 hours.  HbA1C: No results found for: HGBA1C  CBG: Recent Labs  Lab 06/05/19 1826 06/06/19 0019 06/06/19 0232  GLUCAP 79 91 82    Review of Systems:   Cannot assess due to degree of encephalopathy  Past Medical History  She,  has a past medical history of Cancer ([place]) and Depression.   Surgical History    Past Surgical History:  Procedure Laterality Date  . COLON SURGERY       Social History   reports that she has been smoking. She has never used smokeless tobacco. She reports current alcohol use. She reports current drug use. Drug: "Crack" cocaine.   Family History   Her family history includes Cancer in her mother.   Allergies Allergies  Allergen Reactions  . Penicillins Hives and Swelling    Did it involve swelling of the face/tongue/throat, SOB, or low BP? Yes Did it involve sudden or severe rash/hives, skin peeling, or any reaction on the inside of your mouth or nose? No Did you need to seek medical attention at a hospital or doctor's office? No When did it last happen?<10 years If all above answers are "NO", may proceed with cephalosporin use.      Home Medications  Prior to Admission medications   Not on File

## 2019-06-07 NOTE — Progress Notes (Signed)
Seen, still restless, hallucinating. Increase standing lorazepam.

## 2019-06-08 LAB — BASIC METABOLIC PANEL
Anion gap: 12 (ref 5–15)
BUN: 13 mg/dL (ref 6–20)
CO2: 16 mmol/L — ABNORMAL LOW (ref 22–32)
Calcium: 9.5 mg/dL (ref 8.9–10.3)
Chloride: 109 mmol/L (ref 98–111)
Creatinine, Ser: 1.36 mg/dL — ABNORMAL HIGH (ref 0.44–1.00)
GFR calc Af Amer: 52 mL/min — ABNORMAL LOW (ref >60)
GFR calc non Af Amer: 45 mL/min — ABNORMAL LOW (ref >60)
Glucose, Bld: 93 mg/dL (ref 70–99)
Potassium: 3.5 mmol/L (ref 3.5–5.1)
Sodium: 137 mmol/L (ref 135–145)

## 2019-06-08 LAB — MAGNESIUM: Magnesium: 1.6 mg/dL — ABNORMAL LOW (ref 1.7–2.4)

## 2019-06-08 MED ORDER — METOPROLOL TARTRATE 5 MG/5ML IV SOLN
5.0000 mg | Freq: Four times a day (QID) | INTRAVENOUS | Status: DC | PRN
  Administered 2019-06-11: 5 mg via INTRAVENOUS
  Filled 2019-06-08 (×2): qty 5

## 2019-06-08 MED ORDER — MAGNESIUM SULFATE 2 GM/50ML IV SOLN
2.0000 g | Freq: Once | INTRAVENOUS | Status: AC
  Administered 2019-06-08: 2 g via INTRAVENOUS
  Filled 2019-06-08: qty 50

## 2019-06-08 MED ORDER — CLONIDINE HCL 0.3 MG/24HR TD PTWK
0.3000 mg | MEDICATED_PATCH | TRANSDERMAL | Status: DC
  Administered 2019-06-13: 0.3 mg via TRANSDERMAL
  Filled 2019-06-08: qty 1

## 2019-06-08 MED ORDER — POTASSIUM CHLORIDE 10 MEQ/100ML IV SOLN
10.0000 meq | INTRAVENOUS | Status: AC
  Administered 2019-06-08 (×4): 10 meq via INTRAVENOUS
  Filled 2019-06-08 (×4): qty 100

## 2019-06-08 MED ORDER — LORAZEPAM 2 MG/ML IJ SOLN
0.0000 mg | Freq: Four times a day (QID) | INTRAMUSCULAR | Status: DC
  Administered 2019-06-08: 2 mg via INTRAVENOUS
  Administered 2019-06-08: 12:00:00 4 mg via INTRAVENOUS
  Administered 2019-06-10 – 2019-06-11 (×3): 2 mg via INTRAVENOUS
  Administered 2019-06-11: 1 mg via INTRAVENOUS
  Administered 2019-06-11 – 2019-06-12 (×2): 2 mg via INTRAVENOUS
  Filled 2019-06-08 (×2): qty 1
  Filled 2019-06-08: qty 2
  Filled 2019-06-08 (×2): qty 1
  Filled 2019-06-08: qty 2
  Filled 2019-06-08 (×3): qty 1

## 2019-06-08 NOTE — Progress Notes (Signed)
PROGRESS NOTE    Sherry Baker  WER:[id number] DOB: 1966/05/19 DOA: 06/05/2019 PCP: Sherry Baker, Sherry Baker    Brief Narrative:  Sherry Baker is a 53 y.o. female with known history of rectal cancer status post colostomy with history of polysubstance abuse admitted last month July 15 through July 18 for altered mental status at the time Sherry Baker was witnessed to have seizures likely precipitated by polysubstance abuse hypoglycemic episodes and work-up was largely unremarkable with MRI EEG being negative advised follow-up with neurologist was brought to the ER after Sherry Baker was found to be altered mental status in the woods.  Most of the history obtained from ER physician and previous records and has Sherry family was available and at the time of my exam Sherry Baker is being sedated after being given Ativan.  ED Course: In the ER Sherry Baker became alert awake and labs were at baseline.  EKG shows normal sinus rhythm chest x-ray unremarkable alcohol level is positive COVID test was negative and urine drug screen was positive for cocaine.  Once Sherry Baker became alert awake plan was to discharge Sherry Baker back and when Sherry Baker became altered and had a seizure-like episode which was generalized tonic-clonic and became confused and was given Ativan 2 mg IV.  CT of the head was unremarkable on-call neurologist was consulted by the ER physician who advised at this time Sherry neurology work-up is required.  Given the Sherry Baker's ongoing confused state and seizure-like episode Sherry Baker admitted for observation.   Assessment & Plan:   Principal Problem:   Acute encephalopathy Active Problems:   Hypertensive urgency   CKD (chronic kidney disease), stage III (HCC)   Normochromic normocytic anemia   Pressure injury of skin   Acute metabolic encephalopathy   Acute metabolic encephalopathy EtOH withdrawal Sherry Baker presenting after being found in the woods altered.  Recent hospitalization late July for similar episodes related to  underlying substance abuse.  During that hospitalization she was witnessed to have seizures likely participated by her substance abuse combined with hypoglycemic episodes.  Extensive work-up including MRI and EEG were unrevealing.  Case was discussed with on-call neurology, Dr. Malen Gauze this admission who advised Sherry further neurological work-up and Sherry anti-elliptic medications.  Ammonia level normal 35.  Elevated at 100.  UDS positive for cocaine.  Lactic acid 1.0.  CT head without acute disease process. --PCCM consulted on 8/4 for assistance given her significant withdrawals for consideration of ICU level of care and Precedex; Sherry indication for ICU transfer at that time --Monitor blood sugars closely with history of hypoglycemic episodes previously, given her current mental status --CIWAA protocol --Ativan 3 mg every 6 hours scheduled in addition to prn Baker CIWAA --Haldol 2 mg IV q6h prn for severe agitation --B12, iron, thiamine, folate, MVI  Polysubstance abuse UDS positive for cocaine, EtOH level elevated 100 on admission. --CIWAA protocol --Thiamine, folic acid, multivitamin --Supportive care --Social work consult when more alert for discussion of rehab options; but likely will be noncompliant based on her history  Hypertensive urgency Blood pressure elevated to 218/124 admission.  Positive for cocaine on UDS. --Increase clonidine to 0.3 mg patch --Metoprolol 5 mg IV q6h prn --Hydralazine IV prn  Anemia Hemoglobin 10.2 with MCV 90.3 on admission.  Stable.  Sherry signs of blood loss.  History of rectal cancer status post colostomy --Wound care/ostomy consult  Pressure ulcer, present on admission --Sacral/gluteal cleft fissure noted --Wound care following  Hypokalemia / hypomagnesemia Potassium 3.5 and magnesium 1.6 this morning.  Will replete --  Repeat electrolytes to include magnesium in the a.m.    DVT prophylaxis: Lovenox Code Status: Full code Family Communication: None  Disposition Plan: Continue status, further depending on clinical course   Consultants:   Neurology - Dr. Malen Gauze  PCCM - Dr. Tamala Julian  Procedures:   None  Antimicrobials:   None   Subjective: Sherry Baker seen and examined at bedside, continues with altered mental status with episodes of agitation.   Received 16 mg of IV Ativan and 2 doses of IV Haldol over the past 24 hours.  Currently with mitts in place.  Continues with accelerated hypertension.  Knows that she is in the hospital, otherwise not oriented.  Unable to obtain any further ROS. Sherry other acute concerns overnight Baker nursing staff.  Objective: Vitals:   06/07/19 1900 06/08/19 0036 06/08/19 0416 06/08/19 0821  BP: (!) 208/116 (!) 166/96 (!) 191/112 (!) 191/101  Pulse: (!) 111 99 66 90  Resp: (!) 21 16 15 14   Temp: 98 F (36.7 C) 98.3 F (36.8 C) 98.6 F (37 C) 97.7 F (36.5 C)  TempSrc: Axillary Axillary Axillary Axillary  SpO2: 100% 100% 92% 100%  Weight:   47.2 kg   Height:        Intake/Output Summary (Last 24 hours) at 06/08/2019 1140 Last data filed at 06/08/2019 0500 Gross Baker 24 hour  Intake 2143.42 ml  Output 1000 ml  Net 1143.42 ml   Filed Weights   06/06/19 0500 06/07/19 0327 06/08/19 0416  Weight: 49.4 kg 49.4 kg 47.2 kg    Examination:  General exam: Altered with episodes of agitation Respiratory system: Clear to auscultation. Respiratory effort normal.  On room air Cardiovascular system: S1 & S2 heard, RRR. Sherry JVD, murmurs, rubs, gallops or clicks. Sherry pedal edema. Gastrointestinal system: Abdomen is nondistended, soft and nontender. Sherry organomegaly or masses felt. Normal bowel sounds heard.  Colostomy noted with brown stool in bag Central nervous system: Altered, oriented to place (hospital), not time or person [facility]) moves all extremities independently Extremities: Symmetric 5 x 5 power. Skin: Sherry rashes, lesions or ulcers Psychiatry: Altered with episodes of agitation     Data Reviewed: I have personally reviewed following labs and imaging studies  CBC: Recent Labs  Lab 06/05/19 1832 06/06/19 0003 06/07/19 0516  WBC 3.1* 4.5 2.7*  NEUTROABS 2.1  --   --   HGB 10.4* 10.2* 11.1*  HCT 33.4* 32.5* 34.8*  MCV 90.5 90.3 89.2  PLT 190 203 893   Basic Metabolic Panel: Recent Labs  Lab 06/05/19 1832 06/06/19 0003 06/06/19 0820 06/07/19 0516 06/07/19 1533 06/08/19 0809  NA 139 143  --  144  --  137  K 3.9 3.8  --  3.0* 3.9 3.5  CL 112* 114*  --  113*  --  109  CO2 14* 18*  --  19*  --  16*  GLUCOSE 89 98  --  99  --  93  BUN 25* 23*  --  22*  --  13  CREATININE 1.97* 1.84*  --  1.57*  --  1.36*  CALCIUM 8.7* 8.7*  --  9.4  --  9.5  MG  --   --  2.3 2.1  --  1.6*   GFR: Estimated Creatinine Clearance: 36.5 mL/min (A) (by C-G formula based on SCr of 1.36 mg/dL (H)). Liver Function Tests: Recent Labs  Lab 06/05/19 1832  AST 27  ALT 15  ALKPHOS 77  BILITOT 0.6  PROT 7.4  ALBUMIN  3.5   Recent Labs  Lab 06/05/19 1832  LIPASE 56*   Recent Labs  Lab 06/06/19 0003  AMMONIA 35   Coagulation Profile: Sherry results for input(s): INR, PROTIME in the last 168 hours. Cardiac Enzymes: Sherry results for input(s): CKTOTAL, CKMB, CKMBINDEX, TROPONINI in the last 168 hours. BNP (last 3 results) Sherry results for input(s): PROBNP in the last 8760 hours. HbA1C: Sherry results for input(s): HGBA1C in the last 72 hours. CBG: Recent Labs  Lab 06/05/19 1826 06/06/19 0019 06/06/19 0232 06/07/19 1007  GLUCAP 79 91 82 90   Lipid Profile: Sherry results for input(s): CHOL, HDL, LDLCALC, TRIG, CHOLHDL, LDLDIRECT in the last 72 hours. Thyroid Function Tests: Sherry results for input(s): TSH, T4TOTAL, FREET4, T3FREE, THYROIDAB in the last 72 hours. Anemia Panel: Sherry results for input(s): VITAMINB12, FOLATE, FERRITIN, TIBC, IRON, RETICCTPCT in the last 72 hours. Sepsis Labs: Recent Labs  Lab 06/06/19 0004  LATICACIDVEN 1.0    Recent Results (from the past 240  hour(s))  SARS Coronavirus 2 [facility] order, Performed in [facility] hospital lab) Nasopharyngeal Urine, Clean Catch     Status: None   Collection Time: 06/05/19  9:01 PM   Specimen: Urine, Clean Catch; Nasopharyngeal  Result Value Ref Range Status   SARS Coronavirus 2 NEGATIVE NEGATIVE Final    Comment: (NOTE) If result is NEGATIVE SARS-CoV-2 target nucleic acids are NOT DETECTED. The SARS-CoV-2 RNA is generally detectable in upper and lower  respiratory specimens during the acute phase of infection. The lowest  concentration of SARS-CoV-2 viral copies this assay can detect is 250  copies / mL. A negative result does not preclude SARS-CoV-2 infection  and should not be used as the sole basis for treatment or other  Sherry Baker management decisions.  A negative result may occur with  improper specimen collection / handling, submission of specimen other  than nasopharyngeal swab, presence of viral mutation(s) within the  areas targeted by this assay, and inadequate number of viral copies  (<250 copies / mL). A negative result must be combined with clinical  observations, Sherry Baker history, and epidemiological information. If result is POSITIVE SARS-CoV-2 target nucleic acids are DETECTED. The SARS-CoV-2 RNA is generally detectable in upper and lower  respiratory specimens dur ing the acute phase of infection.  Positive  results are indicative of active infection with SARS-CoV-2.  Clinical  correlation with Sherry Baker history and other diagnostic information is  necessary to determine Sherry Baker infection status.  Positive results do  not rule out bacterial infection or co-infection with other viruses. If result is PRESUMPTIVE POSTIVE SARS-CoV-2 nucleic acids MAY BE PRESENT.   A presumptive positive result was obtained on the submitted specimen  and confirmed on repeat testing.  While 2019 novel coronavirus  (SARS-CoV-2) nucleic acids may be present in the submitted sample  additional  confirmatory testing may be necessary for epidemiological  and / or clinical management purposes  to differentiate between  SARS-CoV-2 and other Sarbecovirus currently known to infect humans.  If clinically indicated additional testing with an alternate test  methodology [phone]) is advised. The SARS-CoV-2 RNA is generally  detectable in upper and lower respiratory sp ecimens during the acute  phase of infection. The expected result is Negative. Fact Sheet for Patients:  StrictlyIdeas.Sherry Fact Sheet for Healthcare Providers: BankingDealers.co.za This test is not yet approved or cleared by the [place] FDA and has been authorized for detection and/or diagnosis of SARS-CoV-2 by FDA under an Emergency Use Authorization (EUA).  This EUA will  remain in effect (meaning this test can be used) for the duration of the COVID-19 declaration under Section 564(b)(1) of the Act, 21 U.S.C. section 360bbb-3(b)(1), unless the authorization is terminated or revoked sooner. Performed at [facility], [place] [address], [place], [place] [address]   MRSA PCR Screening     Status: Abnormal   Collection Time: 06/06/19  5:47 AM   Specimen: Nasopharyngeal  Result Value Ref Range Status   MRSA by PCR POSITIVE (A) NEGATIVE Final    Comment:        The GeneXpert MRSA Assay (FDA approved for NASAL specimens only), is one component of a comprehensive MRSA colonization surveillance program. It is not intended to diagnose MRSA infection nor to guide or monitor treatment for MRSA infections. CRITICAL RESULT CALLED TO, READ BACK BY AND VERIFIED WITH: RN K CHARAPLIWY [id number] AT 40 AM BY CM Performed at [facility], [place] [address], [place], [place] [address]          Radiology Studies: Sherry results found.      Scheduled Meds: . Chlorhexidine Gluconate Cloth  6 each Topical Q0600  . [START ON 06/13/2019] cloNIDine  0.3 mg Transdermal Weekly   . enoxaparin (LOVENOX) injection  40 mg Subcutaneous Daily  . folic acid  1 mg Intravenous Daily  . LORazepam  0-4 mg Intravenous Q6H  . LORazepam  3 mg Intravenous Q6H  . mouth rinse  15 mL Mouth Rinse BID  . multivitamin with minerals  1 tablet Oral Daily  . mupirocin ointment  1 application Nasal BID  . thiamine  100 mg Oral Daily   Or  . thiamine  100 mg Intravenous Daily   Continuous Infusions: . dextrose 5 % and 0.45% NaCl 75 mL/hr at 06/07/19 1652     LOS: 2 days    Time spent: 36 minutes spent on chart review, personally reviewing all imaging/laboratory studies, discussion with nursing staff, consultants, updating family and interview/physical exam; more than 50% of that time was spent in counseling and/or coordination of care.    Eric J [place] DO [facility] Pager [phone]  If 7PM-7AM, please contact night-coverage [email or web address] Password [email or web address] 06/08/2019, 11:40 AM

## 2019-06-09 ENCOUNTER — Ambulatory Visit: Payer: Self-pay | Admitting: Diagnostic Neuroimaging

## 2019-06-09 ENCOUNTER — Telehealth: Payer: Self-pay | Admitting: *Deleted

## 2019-06-09 LAB — BASIC METABOLIC PANEL
Anion gap: 10 (ref 5–15)
BUN: 9 mg/dL (ref 6–20)
CO2: 17 mmol/L — ABNORMAL LOW (ref 22–32)
Calcium: 9.3 mg/dL (ref 8.9–10.3)
Chloride: 108 mmol/L (ref 98–111)
Creatinine, Ser: 1.48 mg/dL — ABNORMAL HIGH (ref 0.44–1.00)
GFR calc Af Amer: 47 mL/min — ABNORMAL LOW (ref >60)
GFR calc non Af Amer: 41 mL/min — ABNORMAL LOW (ref >60)
Glucose, Bld: 97 mg/dL (ref 70–99)
Potassium: 3.7 mmol/L (ref 3.5–5.1)
Sodium: 135 mmol/L (ref 135–145)

## 2019-06-09 LAB — MAGNESIUM: Magnesium: 1.6 mg/dL — ABNORMAL LOW (ref 1.7–2.4)

## 2019-06-09 LAB — PHOSPHORUS: Phosphorus: 3.4 mg/dL (ref 2.5–4.6)

## 2019-06-09 MED ORDER — MAGNESIUM SULFATE 2 GM/50ML IV SOLN
2.0000 g | Freq: Once | INTRAVENOUS | Status: AC
  Administered 2019-06-09: 12:00:00 2 g via INTRAVENOUS
  Filled 2019-06-09: qty 50

## 2019-06-09 NOTE — Telephone Encounter (Signed)
Patient was no show for new patient appointment today. 

## 2019-06-09 NOTE — Progress Notes (Signed)
PROGRESS NOTE    ROSHNI BURBANO  RJJ:[id number] DOB: [birth year] DOA: 06/05/2019 PCP: Patient, No Pcp Per    Brief Narrative:  ELLORA VARNUM is a 53 y.o. female with known history of rectal cancer status post colostomy with history of polysubstance abuse admitted last month July 15 through July 18 for altered mental status at the time patient was witnessed to have seizures likely precipitated by polysubstance abuse hypoglycemic episodes and work-up was largely unremarkable with MRI EEG being negative advised follow-up with neurologist was brought to the ER after patient was found to be altered mental status in the woods.  Most of the history obtained from ER physician and previous records and has no family was available and at the time of my exam patient is being sedated after being given Ativan.  ED Course: In the ER patient became alert awake and labs were at baseline.  EKG shows normal sinus rhythm chest x-ray unremarkable alcohol level is positive COVID test was negative and urine drug screen was positive for cocaine.  Once patient became alert awake plan was to discharge patient back and when patient became altered and had a seizure-like episode which was generalized tonic-clonic and became confused and was given Ativan 2 mg IV.  CT of the head was unremarkable on-call neurologist was consulted by the ER physician who advised at this time no neurology work-up is required.  Given the patient's ongoing confused state and seizure-like episode patient admitted for observation.   Assessment & Plan:   Principal Problem:   Acute encephalopathy Active Problems:   Hypertensive urgency   CKD (chronic kidney disease), stage III (HCC)   Normochromic normocytic anemia   Pressure injury of skin   Acute metabolic encephalopathy   Acute metabolic encephalopathy EtOH withdrawal Patient presenting after being found in the woods altered.  Recent hospitalization late July for similar episodes related to  underlying substance abuse.  During that hospitalization she was witnessed to have seizures likely participated by her substance abuse combined with hypoglycemic episodes.  Extensive work-up including MRI and EEG were unrevealing.  Case was discussed with on-call neurology, Dr. Malen Gauze this admission who advised no further neurological work-up and no anti-elliptic medications.  Ammonia level normal 35.  Elevated at 100.  UDS positive for cocaine.  Lactic acid 1.0.  CT head without acute disease process. --PCCM consulted on 8/4 for assistance given her significant withdrawals for consideration of ICU level of care and Precedex; no indication for ICU transfer at that time --Monitor blood sugars closely with history of hypoglycemic episodes previously, given her current mental status --CIWAA protocol --Ativan 3 mg every 6 hours scheduled in addition to prn per CIWAA --Haldol 2 mg IV q6h prn for severe agitation --B12, iron, thiamine, folate, MVI  Polysubstance abuse UDS positive for cocaine, EtOH level elevated 100 on admission. --CIWAA protocol --Thiamine, folic acid, multivitamin --Supportive care --Social work consult when more alert for discussion of rehab options; but likely will be noncompliant based on her history  Hypertensive urgency Blood pressure elevated to 218/124 admission.  Positive for cocaine on UDS. --Increase clonidine to 0.3 mg patch --Metoprolol 5 mg IV q6h prn --Hydralazine IV prn  Anemia Hemoglobin 10.2 with MCV 90.3 on admission.  Stable.  No signs of blood loss.  History of rectal cancer status post colostomy --Wound care/ostomy consult  Pressure ulcer, present on admission --Sacral/gluteal cleft fissure noted --Wound care following  Hypokalemia / hypomagnesemia Potassium 3.5 and magnesium 1.6 this morning.  Will replete --  Repeat electrolytes to include magnesium in the a.m.    DVT prophylaxis: Lovenox Code Status: Full code Family Communication: None  Disposition Plan: Continue status, further depending on clinical course   Consultants:   Neurology - Dr. Malen Gauze  PCCM - Dr. Tamala Julian  Procedures:   None  Antimicrobials:   None   Subjective: Patient seen and examined at bedside, continues with altered mental status with episodes of agitation.  Patient knows that she is in the hospital, but thinks that the president is Abigail Miyamoto (which is a slight improvement yet from yesterday in which she thought Ginnie Smart was the president). Unable to obtain any further ROS. No other acute concerns overnight per nursing staff.  Objective: Vitals:   06/08/19 1813 06/08/19 2031 06/08/19 2316 06/09/19 0300  BP: (!) 162/89  (!) 166/83   Pulse: (!) 114 (!) 119 (!) 120 (!) 113  Resp:  (!) 23 18 19   Temp:  98 F (36.7 C) 98.7 F (37.1 C)   TempSrc:  Axillary Axillary Axillary  SpO2:  99% 100% 100%  Weight:    46.6 kg  Height:        Intake/Output Summary (Last 24 hours) at 06/09/2019 1243 Last data filed at 06/09/2019 0400 Gross per 24 hour  Intake 2577.93 ml  Output -  Net 2577.93 ml   Filed Weights   06/07/19 0327 06/08/19 0416 06/09/19 0300  Weight: 49.4 kg 47.2 kg 46.6 kg    Examination:  General exam: Altered with episodes of agitation Respiratory system: Clear to auscultation. Respiratory effort normal.  On room air Cardiovascular system: S1 & S2 heard, RRR. No JVD, murmurs, rubs, gallops or clicks. No pedal edema. Gastrointestinal system: Abdomen is nondistended, soft and nontender. No organomegaly or masses felt. Normal bowel sounds heard.  Colostomy noted with brown stool in bag Central nervous system: Altered, oriented to place (hospital), not time or person (president Abigail Miyamoto) moves all extremities independently Extremities: Symmetric 5 x 5 power. Skin: No rashes, lesions or ulcers Psychiatry: Altered with episodes of agitation    Data Reviewed: I have personally reviewed following labs and imaging  studies  CBC: Recent Labs  Lab 06/05/19 1832 06/06/19 0003 06/07/19 0516  WBC 3.1* 4.5 2.7*  NEUTROABS 2.1  --   --   HGB 10.4* 10.2* 11.1*  HCT 33.4* 32.5* 34.8*  MCV 90.5 90.3 89.2  PLT 190 203 585   Basic Metabolic Panel: Recent Labs  Lab 06/05/19 1832 06/06/19 0003 06/06/19 0820 06/07/19 0516 06/07/19 1533 06/08/19 0809 06/09/19 0557  NA 139 143  --  144  --  137 135  K 3.9 3.8  --  3.0* 3.9 3.5 3.7  CL 112* 114*  --  113*  --  109 108  CO2 14* 18*  --  19*  --  16* 17*  GLUCOSE 89 98  --  99  --  93 97  BUN 25* 23*  --  22*  --  13 9  CREATININE 1.97* 1.84*  --  1.57*  --  1.36* 1.48*  CALCIUM 8.7* 8.7*  --  9.4  --  9.5 9.3  MG  --   --  2.3 2.1  --  1.6* 1.6*  PHOS  --   --   --   --   --   --  3.4   GFR: Estimated Creatinine Clearance: 33.1 mL/min (A) (by C-G formula based on SCr of 1.48 mg/dL (H)). Liver Function Tests: Recent Labs  Lab 06/05/19  1832  AST 27  ALT 15  ALKPHOS 77  BILITOT 0.6  PROT 7.4  ALBUMIN 3.5   Recent Labs  Lab 06/05/19 1832  LIPASE 56*   Recent Labs  Lab 06/06/19 0003  AMMONIA 35   Coagulation Profile: No results for input(s): INR, PROTIME in the last 168 hours. Cardiac Enzymes: No results for input(s): CKTOTAL, CKMB, CKMBINDEX, TROPONINI in the last 168 hours. BNP (last 3 results) No results for input(s): PROBNP in the last 8760 hours. HbA1C: No results for input(s): HGBA1C in the last 72 hours. CBG: Recent Labs  Lab 06/05/19 1826 06/06/19 0019 06/06/19 0232 06/07/19 1007  GLUCAP 79 91 82 90   Lipid Profile: No results for input(s): CHOL, HDL, LDLCALC, TRIG, CHOLHDL, LDLDIRECT in the last 72 hours. Thyroid Function Tests: No results for input(s): TSH, T4TOTAL, FREET4, T3FREE, THYROIDAB in the last 72 hours. Anemia Panel: No results for input(s): VITAMINB12, FOLATE, FERRITIN, TIBC, IRON, RETICCTPCT in the last 72 hours. Sepsis Labs: Recent Labs  Lab 06/06/19 0004  LATICACIDVEN 1.0    Recent Results  (from the past 240 hour(s))  SARS Coronavirus 2 [employer] order, Performed in [facility] hospital lab) Nasopharyngeal Urine, Clean Catch     Status: None   Collection Time: 06/05/19  9:01 PM   Specimen: Urine, Clean Catch; Nasopharyngeal  Result Value Ref Range Status   SARS Coronavirus 2 NEGATIVE NEGATIVE Final    Comment: (NOTE) If result is NEGATIVE SARS-CoV-2 target nucleic acids are NOT DETECTED. The SARS-CoV-2 RNA is generally detectable in upper and lower  respiratory specimens during the acute phase of infection. The lowest  concentration of SARS-CoV-2 viral copies this assay can detect is 250  copies / mL. A negative result does not preclude SARS-CoV-2 infection  and should not be used as the sole basis for treatment or other  patient management decisions.  A negative result may occur with  improper specimen collection / handling, submission of specimen other  than nasopharyngeal swab, presence of viral mutation(s) within the  areas targeted by this assay, and inadequate number of viral copies  (<250 copies / mL). A negative result must be combined with clinical  observations, patient history, and epidemiological information. If result is POSITIVE SARS-CoV-2 target nucleic acids are DETECTED. The SARS-CoV-2 RNA is generally detectable in upper and lower  respiratory specimens dur ing the acute phase of infection.  Positive  results are indicative of active infection with SARS-CoV-2.  Clinical  correlation with patient history and other diagnostic information is  necessary to determine patient infection status.  Positive results do  not rule out bacterial infection or co-infection with other viruses. If result is PRESUMPTIVE POSTIVE SARS-CoV-2 nucleic acids MAY BE PRESENT.   A presumptive positive result was obtained on the submitted specimen  and confirmed on repeat testing.  While 2019 novel coronavirus  (SARS-CoV-2) nucleic acids may be present in the submitted sample   additional confirmatory testing may be necessary for epidemiological  and / or clinical management purposes  to differentiate between  SARS-CoV-2 and other Sarbecovirus currently known to infect humans.  If clinically indicated additional testing with an alternate test  methodology [phone]) is advised. The SARS-CoV-2 RNA is generally  detectable in upper and lower respiratory sp ecimens during the acute  phase of infection. The expected result is Negative. Fact Sheet for Patients:  StrictlyIdeas.no Fact Sheet for Healthcare Providers: BankingDealers.co.za This test is not yet approved or cleared by the [place] FDA and has been authorized  for detection and/or diagnosis of SARS-CoV-2 by FDA under an Emergency Use Authorization (EUA).  This EUA will remain in effect (meaning this test can be used) for the duration of the COVID-19 declaration under Section 564(b)(1) of the Act, 21 U.S.C. section 360bbb-3(b)(1), unless the authorization is terminated or revoked sooner. Performed at [facility], [place] [address], [place], [place] [address]   MRSA PCR Screening     Status: Abnormal   Collection Time: 06/06/19  5:47 AM   Specimen: Nasopharyngeal  Result Value Ref Range Status   MRSA by PCR POSITIVE (A) NEGATIVE Final    Comment:        The GeneXpert MRSA Assay (FDA approved for NASAL specimens only), is one component of a comprehensive MRSA colonization surveillance program. It is not intended to diagnose MRSA infection nor to guide or monitor treatment for MRSA infections. CRITICAL RESULT CALLED TO, READ BACK BY AND VERIFIED WITH: RN K CHARAPLIWY [id number] AT 14 AM BY CM Performed at [facility], [place] [address], [place],  [address]          Radiology Studies: No results found.      Scheduled Meds: . Chlorhexidine Gluconate Cloth  6 each Topical Q0600  . [START ON 06/13/2019] cloNIDine  0.3 mg  Transdermal Weekly  . enoxaparin (LOVENOX) injection  40 mg Subcutaneous Daily  . folic acid  1 mg Intravenous Daily  . LORazepam  0-4 mg Intravenous Q6H  . LORazepam  3 mg Intravenous Q6H  . mouth rinse  15 mL Mouth Rinse BID  . multivitamin with minerals  1 tablet Oral Daily  . mupirocin ointment  1 application Nasal BID  . thiamine  100 mg Oral Daily   Or  . thiamine  100 mg Intravenous Daily   Continuous Infusions: . dextrose 5 % and 0.45% NaCl 75 mL/hr at 06/09/19 0400     LOS: 3 days    Time spent: 36 minutes spent on chart review, personally reviewing all imaging/laboratory studies, discussion with nursing staff, consultants, updating family and interview/physical exam; more than 50% of that time was spent in counseling and/or coordination of care.     J [place] DO [facility] Pager [phone]  If 7PM-7AM, please contact night-coverage [email or web address] Password [email or web address] 06/09/2019, 12:43 PM

## 2019-06-10 ENCOUNTER — Inpatient Hospital Stay: Payer: Medicaid Other | Admitting: Internal Medicine

## 2019-06-10 LAB — BASIC METABOLIC PANEL
Anion gap: 11 (ref 5–15)
BUN: 8 mg/dL (ref 6–20)
CO2: 16 mmol/L — ABNORMAL LOW (ref 22–32)
Calcium: 9 mg/dL (ref 8.9–10.3)
Chloride: 107 mmol/L (ref 98–111)
Creatinine, Ser: 1.43 mg/dL — ABNORMAL HIGH (ref 0.44–1.00)
GFR calc Af Amer: 49 mL/min — ABNORMAL LOW (ref >60)
GFR calc non Af Amer: 42 mL/min — ABNORMAL LOW (ref >60)
Glucose, Bld: 102 mg/dL — ABNORMAL HIGH (ref 70–99)
Potassium: 3.2 mmol/L — ABNORMAL LOW (ref 3.5–5.1)
Sodium: 134 mmol/L — ABNORMAL LOW (ref 135–145)

## 2019-06-10 LAB — POTASSIUM: Potassium: 3.7 mmol/L (ref 3.5–5.1)

## 2019-06-10 LAB — MAGNESIUM: Magnesium: 2 mg/dL (ref 1.7–2.4)

## 2019-06-10 MED ORDER — CHLORHEXIDINE GLUCONATE 0.12 % MT SOLN
15.0000 mL | Freq: Two times a day (BID) | OROMUCOSAL | Status: DC
  Administered 2019-06-10 – 2019-06-14 (×8): 15 mL via OROMUCOSAL
  Filled 2019-06-10 (×7): qty 15

## 2019-06-10 MED ORDER — ORAL CARE MOUTH RINSE
15.0000 mL | Freq: Two times a day (BID) | OROMUCOSAL | Status: DC
  Administered 2019-06-10 – 2019-06-13 (×3): 15 mL via OROMUCOSAL

## 2019-06-10 MED ORDER — POTASSIUM CHLORIDE 10 MEQ/100ML IV SOLN
10.0000 meq | INTRAVENOUS | Status: DC
  Filled 2019-06-10: qty 100

## 2019-06-10 MED ORDER — POTASSIUM CHLORIDE 10 MEQ/100ML IV SOLN
10.0000 meq | INTRAVENOUS | Status: AC
  Administered 2019-06-10 (×6): 10 meq via INTRAVENOUS
  Filled 2019-06-10 (×5): qty 100

## 2019-06-10 NOTE — Progress Notes (Signed)
PROGRESS NOTE    [name]  HYW:[id number] DOB: 07-Jul-1966 DOA: 06/05/2019 PCP: Patient, No Pcp Per    Brief Narrative:  Sherry Baker is a 53 y.o. female with known history of rectal cancer status post colostomy with history of polysubstance abuse admitted last month July 15 through July 18 for altered mental status at the time patient was witnessed to have seizures likely precipitated by polysubstance abuse hypoglycemic episodes and work-up was largely unremarkable with MRI EEG being negative advised follow-up with neurologist was brought to the ER after patient was found to be altered mental status in the woods.  Most of the history obtained from ER physician and previous records and has no family was available and at the time of my exam patient is being sedated after being given Ativan.  ED Course: In the ER patient became alert awake and labs were at baseline.  EKG shows normal sinus rhythm chest x-ray unremarkable alcohol level is positive COVID test was negative and urine drug screen was positive for cocaine.  Once patient became alert awake plan was to discharge patient back and when patient became altered and had a seizure-like episode which was generalized tonic-clonic and became confused and was given Ativan 2 mg IV.  CT of the head was unremarkable on-call neurologist was consulted by the ER physician who advised at this time no neurology work-up is required.  Given the patient's ongoing confused state and seizure-like episode patient admitted for observation.   Assessment & Plan:   Principal Problem:   Acute encephalopathy Active Problems:   Hypertensive urgency   CKD (chronic kidney disease), stage III (HCC)   Normochromic normocytic anemia   Pressure injury of skin   Acute metabolic encephalopathy   Acute metabolic encephalopathy EtOH withdrawal Patient presenting after being found in the woods altered.  Recent hospitalization late July for similar episodes related to  underlying substance abuse.  During that hospitalization she was witnessed to have seizures likely participated by her substance abuse combined with hypoglycemic episodes.  Extensive work-up including MRI and EEG were unrevealing.  Case was discussed with on-call neurology, Dr. Malen Gauze this admission who advised no further neurological work-up and no anti-elliptic medications.  Ammonia level normal 35.  Elevated at 100.  UDS positive for cocaine.  Lactic acid 1.0.  CT head without acute disease process. PCCM consulted on 8/4 for assistance given her significant withdrawals for consideration of ICU level of care and Precedex; no indication for ICU transfer at that time. --Mentation is markedly improved today --We will discontinue scheduled Ativan --Continue CIWAA with prn ativan and haldol --Speech therapy evaluated with recommendations of dysphasia 3 diet --Monitor blood sugars closely with history of hypoglycemic episodes previously, given her current mental status --B12, iron, thiamine, folate, MVI  Polysubstance abuse UDS positive for cocaine, EtOH level elevated 100 on admission. --CIWAA protocol --Thiamine, folic acid, multivitamin --Supportive care --Social work consult for discussion of rehab options; but likely will be noncompliant based on her history  Hypertensive urgency Blood pressure elevated to 218/124 admission.  Positive for cocaine on UDS.  --Continue clonidine to 0.3 mg patch --Metoprolol 5 mg IV q6h prn --Hydralazine IV prn  Anemia Hemoglobin 10.2 with MCV 90.3 on admission.  Stable.  No signs of blood loss.  History of rectal cancer status post colostomy --Wound care/ostomy consult  Pressure ulcer, present on admission --Sacral/gluteal cleft fissure noted --Wound care following  Hypokalemia / hypomagnesemia Potassium 3.5 and magnesium 1.6 this morning.  Will replete --  Repeat electrolytes to include magnesium in the a.m.    DVT prophylaxis: Lovenox Code Status:  Full code Family Communication: None Disposition Plan: Continue inpatient, further depending on clinical course, PT/OT consultation   Consultants:   Neurology - Dr. Malen Gauze  PCCM - Dr. Tamala Julian  Procedures:   None  Antimicrobials:   None   Subjective: Patient seen and examined at bedside, mental status improved.  States that she is thirsty.  No other specific complaints brought up this morning.  Denies headache, no fever/chills/night sweats, no nausea/vomiting/diarrhea, no chest pain, palpitations, no abdominal pain.  No acute events overnight per nursing staff.  Objective: Vitals:   06/10/19 0846 06/10/19 0847 06/10/19 0848 06/10/19 0849  BP:      Pulse: 94 90 93 92  Resp: 16 15 16 16   Temp:      TempSrc:      SpO2: 99% 100% 99% 99%  Weight:      Height:        Intake/Output Summary (Last 24 hours) at 06/10/2019 1232 Last data filed at 06/10/2019 1213 Gross per 24 hour  Intake 1452.26 ml  Output 900 ml  Net 552.26 ml   Filed Weights   06/08/19 0416 06/09/19 0300 06/10/19 0500  Weight: 47.2 kg 46.6 kg 48.1 kg    Examination:  General exam: No acute distress, disheveled in appearance Respiratory system: Clear to auscultation. Respiratory effort normal.  On room air Cardiovascular system: S1 & S2 heard, RRR. No JVD, murmurs, rubs, gallops or clicks. No pedal edema. Gastrointestinal system: Abdomen is nondistended, soft and nontender. No organomegaly or masses felt. Normal bowel sounds heard.  Colostomy noted with brown stool in bag Central nervous system: Alert and oriented x3, moves all extremities independently Extremities: Symmetric 5 x 5 power. Skin: Sacral fissure as depicted below Psychiatry: Flat affect, depressed mood      Data Reviewed: I have personally reviewed following labs and imaging studies  CBC: Recent Labs  Lab 06/05/19 1832 06/06/19 0003 06/07/19 0516  WBC 3.1* 4.5 2.7*  NEUTROABS 2.1  --   --   HGB 10.4* 10.2* 11.1*  HCT 33.4* 32.5*  34.8*  MCV 90.5 90.3 89.2  PLT 190 203 371   Basic Metabolic Panel: Recent Labs  Lab 06/06/19 0003 06/06/19 0820 06/07/19 0516 06/07/19 1533 06/08/19 0809 06/09/19 0557 06/10/19 0411  NA 143  --  144  --  137 135 134*  K 3.8  --  3.0* 3.9 3.5 3.7 3.2*  CL 114*  --  113*  --  109 108 107  CO2 18*  --  19*  --  16* 17* 16*  GLUCOSE 98  --  99  --  93 97 102*  BUN 23*  --  22*  --  13 9 8   CREATININE 1.84*  --  1.57*  --  1.36* 1.48* 1.43*  CALCIUM 8.7*  --  9.4  --  9.5 9.3 9.0  MG  --  2.3 2.1  --  1.6* 1.6* 2.0  PHOS  --   --   --   --   --  3.4  --    GFR: Estimated Creatinine Clearance: 35.3 mL/min (A) (by C-G formula based on SCr of 1.43 mg/dL (H)). Liver Function Tests: Recent Labs  Lab 06/05/19 1832  AST 27  ALT 15  ALKPHOS 77  BILITOT 0.6  PROT 7.4  ALBUMIN 3.5   Recent Labs  Lab 06/05/19 1832  LIPASE 56*   Recent Labs  Lab 06/06/19 0003  AMMONIA 35   Coagulation Profile: No results for input(s): INR, PROTIME in the last 168 hours. Cardiac Enzymes: No results for input(s): CKTOTAL, CKMB, CKMBINDEX, TROPONINI in the last 168 hours. BNP (last 3 results) No results for input(s): PROBNP in the last 8760 hours. HbA1C: No results for input(s): HGBA1C in the last 72 hours. CBG: Recent Labs  Lab 06/05/19 1826 06/06/19 0019 06/06/19 0232 06/07/19 1007  GLUCAP 79 91 82 90   Lipid Profile: No results for input(s): CHOL, HDL, LDLCALC, TRIG, CHOLHDL, LDLDIRECT in the last 72 hours. Thyroid Function Tests: No results for input(s): TSH, T4TOTAL, FREET4, T3FREE, THYROIDAB in the last 72 hours. Anemia Panel: No results for input(s): VITAMINB12, FOLATE, FERRITIN, TIBC, IRON, RETICCTPCT in the last 72 hours. Sepsis Labs: Recent Labs  Lab 06/06/19 0004  LATICACIDVEN 1.0    Recent Results (from the past 240 hour(s))  SARS Coronavirus 2 [facility] order, Performed in [facility] hospital lab) Nasopharyngeal Urine, Clean Catch     Status: None    Collection Time: 06/05/19  9:01 PM   Specimen: Urine, Clean Catch; Nasopharyngeal  Result Value Ref Range Status   SARS Coronavirus 2 NEGATIVE NEGATIVE Final    Comment: (NOTE) If result is NEGATIVE SARS-CoV-2 target nucleic acids are NOT DETECTED. The SARS-CoV-2 RNA is generally detectable in upper and lower  respiratory specimens during the acute phase of infection. The lowest  concentration of SARS-CoV-2 viral copies this assay can detect is 250  copies / mL. A negative result does not preclude SARS-CoV-2 infection  and should not be used as the sole basis for treatment or other  patient management decisions.  A negative result may occur with  improper specimen collection / handling, submission of specimen other  than nasopharyngeal swab, presence of viral mutation(s) within the  areas targeted by this assay, and inadequate number of viral copies  (<250 copies / mL). A negative result must be combined with clinical  observations, patient history, and epidemiological information. If result is POSITIVE SARS-CoV-2 target nucleic acids are DETECTED. The SARS-CoV-2 RNA is generally detectable in upper and lower  respiratory specimens dur ing the acute phase of infection.  Positive  results are indicative of active infection with SARS-CoV-2.  Clinical  correlation with patient history and other diagnostic information is  necessary to determine patient infection status.  Positive results do  not rule out bacterial infection or co-infection with other viruses. If result is PRESUMPTIVE POSTIVE SARS-CoV-2 nucleic acids MAY BE PRESENT.   A presumptive positive result was obtained on the submitted specimen  and confirmed on repeat testing.  While 2019 novel coronavirus  (SARS-CoV-2) nucleic acids may be present in the submitted sample  additional confirmatory testing may be necessary for epidemiological  and / or clinical management purposes  to differentiate between  SARS-CoV-2 and other  Sarbecovirus currently known to infect humans.  If clinically indicated additional testing with an alternate test  methodology [id number]) is advised. The SARS-CoV-2 RNA is generally  detectable in upper and lower respiratory sp ecimens during the acute  phase of infection. The expected result is Negative. Fact Sheet for Patients:  StrictlyIdeas.no Fact Sheet for Healthcare Providers: BankingDealers.co.za This test is not yet approved or cleared by the [place] FDA and has been authorized for detection and/or diagnosis of SARS-CoV-2 by FDA under an Emergency Use Authorization (EUA).  This EUA will remain in effect (meaning this test can be used) for the duration of the COVID-19 declaration under  Section 564(b)(1) of the Act, 21 U.S.C. section 360bbb-3(b)(1), unless the authorization is terminated or revoked sooner. Performed at [facility], [place] [address] [place], [place] [address]   MRSA PCR Screening     Status: Abnormal   Collection Time: 06/06/19  5:47 AM   Specimen: Nasopharyngeal  Result Value Ref Range Status   MRSA by PCR POSITIVE (A) NEGATIVE Final    Comment:        The GeneXpert MRSA Assay (FDA approved for NASAL specimens only), is one component of a comprehensive MRSA colonization surveillance program. It is not intended to diagnose MRSA infection nor to guide or monitor treatment for MRSA infections. CRITICAL RESULT CALLED TO, READ BACK BY AND VERIFIED WITH: RN K CHARAPLIWY [id number] AT 7 AM BY CM Performed at [facility], [place] [address], [place], [place] [address]          Radiology Studies: No results found.      Scheduled Meds: . chlorhexidine  15 mL Mouth Rinse BID  . [START ON 06/13/2019] cloNIDine  0.3 mg Transdermal Weekly  . enoxaparin (LOVENOX) injection  40 mg Subcutaneous Daily  . folic acid  1 mg Intravenous Daily  . LORazepam  0-4 mg Intravenous Q6H  . mouth rinse  15 mL  Mouth Rinse q12n4p  . multivitamin with minerals  1 tablet Oral Daily  . mupirocin ointment  1 application Nasal BID  . thiamine  100 mg Oral Daily   Or  . thiamine  100 mg Intravenous Daily   Continuous Infusions: . potassium chloride 10 mEq (06/10/19 1202)     LOS: 4 days    Time spent: 36 minutes spent on chart review, personally reviewing all imaging/laboratory studies, discussion with nursing staff, consultants, updating family and interview/physical exam; more than 50% of that time was spent in counseling and/or coordination of care.    Herman Fiero J [place] DO [facility] Pager [phone]  If 7PM-7AM, please contact night-coverage [email or web address] Password [facility] 06/10/2019, 12:32 PM

## 2019-06-10 NOTE — Progress Notes (Signed)
Patient found during bed side reporting sitting in the middle of the bed with bed pad and bed sheet saturated with blood. Pt had ripped of her IV tubing and blood was coming from the IV site, no skin tear found. We'll continue to monitor.

## 2019-06-10 NOTE — Plan of Care (Signed)
  Problem: Pain Managment: Goal: General experience of comfort will improve Outcome: Progressing   

## 2019-06-10 NOTE — Evaluation (Signed)
Clinical/Bedside Swallow Evaluation Patient Details  Name: Sherry Baker MRN: [id number] Date of Birth: Apr 01, 1966  Today's Date: 06/10/2019 Time: SLP Start Time (ACUTE ONLY): 0940 SLP Stop Time (ACUTE ONLY): 0957 SLP Time Calculation (min) (ACUTE ONLY): 17 min  Past Medical History:  Past Medical History:  Diagnosis Date  . Cancer ([place])   . Depression    Past Surgical History:  Past Surgical History:  Procedure Laterality Date  . COLON SURGERY     HPI:  53 year old woman with hx of polysubstance abuse, likely withdrawal seizures found in woods altered.  UDS with cocaine, EtOH 100. PMHx rectal cancer with resection/ostomy, chemoradiation, HTN, Bipolar 1, depression, CKD.  Now with acute delirium, question of Wernicke's raised at last admission.    Assessment / Plan / Recommendation Clinical Impression  Pt alert, following commands, oriented to person, place, president, year.  Remains confused, somewhat confabulatory, but with intermittent appropriate content (concerned for her sons, worried about having another seizure).  Pt demonstrated adequate anticipation/mastication of food items, brisk swallow response, no overt s/s of aspiration.  She needed assist to bring utensils to mouth.  Recommend starting a mechanical soft/dysphagia 3 diet, thin liquids, give meds whole in puree.  No SLP f/u is needed - our service will sign off.     Soft restraints reapplied before leaving room. SLP Visit Diagnosis: Dysphagia, unspecified (R13.10)    Aspiration Risk       Diet Recommendation   mechanical soft, thin liquids  Medication Administration: Whole meds with puree    Other  Recommendations     Follow up Recommendations None      Frequency and Duration            Prognosis        Swallow Study   General Date of Onset: 06/05/19 HPI: 53 year old woman with hx of polysubstance abuse, likely withdrawal seizures found in woods altered.  UDS with cocaine, EtOH 100. PMHx rectal cancer  with resection/ostomy, chemoradiation, HTN, Bipolar 1, depression, CKD.  Now with acute delirium, question of Wernicke's raised at last admission.  Type of Study: Bedside Swallow Evaluation Previous Swallow Assessment: no Diet Prior to this Study: NPO Temperature Spikes Noted: No Respiratory Status: Room air History of Recent Intubation: No Behavior/Cognition: Alert;Confused Oral Cavity Assessment: Dried secretions Oral Care Completed by SLP: Yes Oral Cavity - Dentition: Missing dentition Vision: Functional for self-feeding Self-Feeding Abilities: Needs assist Patient Positioning: Upright in bed Baseline Vocal Quality: Normal Volitional Cough: Strong Volitional Swallow: Able to elicit    Oral/Motor/Sensory Function Overall Oral Motor/Sensory Function: Within functional limits   Ice Chips Ice chips: Within functional limits   Thin Liquid Thin Liquid: Within functional limits    Nectar Thick Nectar Thick Liquid: Not tested   Honey Thick Honey Thick Liquid: Not tested   Puree Puree: Within functional limits   Solid     Solid: Within functional limits      [name] 06/10/2019,10:04 AM   Estill Bamberg L. Tivis Ringer, [place] Office number [phone] Pager [phone]

## 2019-06-11 LAB — BASIC METABOLIC PANEL
Anion gap: 12 (ref 5–15)
BUN: 11 mg/dL (ref 6–20)
CO2: 16 mmol/L — ABNORMAL LOW (ref 22–32)
Calcium: 9.4 mg/dL (ref 8.9–10.3)
Chloride: 109 mmol/L (ref 98–111)
Creatinine, Ser: 1.42 mg/dL — ABNORMAL HIGH (ref 0.44–1.00)
GFR calc Af Amer: 49 mL/min — ABNORMAL LOW (ref >60)
GFR calc non Af Amer: 43 mL/min — ABNORMAL LOW (ref >60)
Glucose, Bld: 84 mg/dL (ref 70–99)
Potassium: 4 mmol/L (ref 3.5–5.1)
Sodium: 137 mmol/L (ref 135–145)

## 2019-06-11 LAB — GLUCOSE, CAPILLARY
Glucose-Capillary: 72 mg/dL (ref 70–99)
Glucose-Capillary: 83 mg/dL (ref 70–99)
Glucose-Capillary: 82 mg/dL (ref 70–99)

## 2019-06-11 LAB — MAGNESIUM: Magnesium: 1.7 mg/dL (ref 1.7–2.4)

## 2019-06-11 MED ORDER — AMLODIPINE BESYLATE 5 MG PO TABS
5.0000 mg | ORAL_TABLET | Freq: Every day | ORAL | Status: DC
  Administered 2019-06-11: 5 mg via ORAL
  Filled 2019-06-11: qty 1

## 2019-06-11 MED ORDER — FOLIC ACID 1 MG PO TABS
1.0000 mg | ORAL_TABLET | Freq: Every day | ORAL | Status: DC
  Administered 2019-06-11 – 2019-06-14 (×4): 1 mg via ORAL
  Filled 2019-06-11 (×4): qty 1

## 2019-06-11 MED ORDER — QUETIAPINE FUMARATE 25 MG PO TABS
25.0000 mg | ORAL_TABLET | Freq: Every day | ORAL | Status: DC
  Administered 2019-06-11 – 2019-06-13 (×3): 25 mg via ORAL
  Filled 2019-06-11 (×3): qty 1

## 2019-06-11 MED ORDER — MAGNESIUM SULFATE 2 GM/50ML IV SOLN
2.0000 g | Freq: Once | INTRAVENOUS | Status: AC
  Administered 2019-06-11: 09:00:00 2 g via INTRAVENOUS
  Filled 2019-06-11: qty 50

## 2019-06-11 NOTE — Progress Notes (Signed)
PROGRESS NOTE    Sherry Baker  PIR:[id number] DOB: [birth year] DOA: 06/05/2019 PCP: Patient, No Pcp Per    Brief Narrative:  Sherry Baker is a 53 y.o. female with known history of rectal cancer status post colostomy with history of polysubstance abuse admitted last month July 15 through July 18 for altered mental status at the time patient was witnessed to have seizures likely precipitated by polysubstance abuse hypoglycemic episodes and work-up was largely unremarkable with MRI EEG being negative advised follow-up with neurologist was brought to the ER after patient was found to be altered mental status in the woods.  Most of the history obtained from ER physician and previous records and has no family was available and at the time of my exam patient is being sedated after being given Ativan.  ED Course: In the ER patient became alert awake and labs were at baseline.  EKG shows normal sinus rhythm chest x-ray unremarkable alcohol level is positive COVID test was negative and urine drug screen was positive for cocaine.  Once patient became alert awake plan was to discharge patient back and when patient became altered and had a seizure-like episode which was generalized tonic-clonic and became confused and was given Ativan 2 mg IV.  CT of the head was unremarkable on-call neurologist was consulted by the ER physician who advised at this time no neurology work-up is required.  Given the patient's ongoing confused state and seizure-like episode patient admitted for observation.   Assessment & Plan:   Principal Problem:   Acute encephalopathy Active Problems:   Hypertensive urgency   CKD (chronic kidney disease), stage III (HCC)   Normochromic normocytic anemia   Pressure injury of skin   Acute metabolic encephalopathy   Acute metabolic encephalopathy EtOH withdrawal Patient presenting after being found in the woods altered.  Recent hospitalization late July for similar episodes related to  underlying substance abuse.  During that hospitalization she was witnessed to have seizures likely participated by her substance abuse combined with hypoglycemic episodes.  Extensive work-up including MRI and EEG were unrevealing.  Case was discussed with on-call neurology, Dr. Malen Gauze this admission who advised no further neurological work-up and no anti-elliptic medications.  Ammonia level normal 35.  Elevated at 100.  UDS positive for cocaine.  Lactic acid 1.0.  CT head without acute disease process. PCCM consulted on 8/4 for assistance given her significant withdrawals for consideration of ICU level of care and Precedex; no indication for ICU transfer at that time. --Mentation continues to slightly wax and wane, although much improved over the past 2 days --Discontinued scheduled Ativan on 06/10/2019 --Continue CIWAA with prn ativan and haldol --Adding Seroquel 25 mg p.o. nightly --Speech therapy evaluated with recommendations of dysphasia 3 diet --Monitor blood sugars closely with history of hypoglycemic episodes previously, given her current mental status --B12, iron, thiamine, folate, MVI   Polysubstance abuse UDS positive for cocaine, EtOH level elevated 100 on admission. --CIWAA protocol --Thiamine, folic acid, multivitamin --Supportive care --Social work consult for discussion of rehab options; but likely will be noncompliant based on her history  Hypertensive urgency Blood pressure elevated to 218/124 admission.  Positive for cocaine on UDS.  --Continue clonidine 0.3 mg patch --Metoprolol 5 mg IV q6h prn --Hydralazine IV prn --Starting amlodipine 5 mg p.o. daily on 06/11/2019  Anemia Hemoglobin 10.2 with MCV 90.3 on admission.  Stable.  No signs of blood loss.  History of rectal cancer status post colostomy --Wound care/ostomy consult  Pressure ulcer,  present on admission --Sacral/gluteal cleft fissure noted --Wound care following  Hypokalemia / hypomagnesemia Potassium 4.0  and magnesium 1.7 this morning.  Will replete magnesium --Repeat electrolytes to include magnesium in the a.m.    DVT prophylaxis: Lovenox Code Status: Full code Family Communication: None Disposition Plan: Continue inpatient, further depending on clinical course, PT/OT consultation pending.   Consultants:   Neurology - Dr. Malen Gauze  PCCM - Dr. Tamala Julian  Procedures:   None  Antimicrobials:   None   Subjective: Patient seen and examined at bedside, sitter present.  Mental status improved, but continues to wax and wane; believes it is 2021 and she believes she is at home this morning. No other specific complaints brought up this morning.  Denies headache, no fever/chills/night sweats, no nausea/vomiting/diarrhea, no chest pain, palpitations, no abdominal pain.  No acute events overnight per nursing staff.  Objective: Vitals:   06/11/19 0059 06/11/19 0300 06/11/19 0500 06/11/19 0800  BP: (!) 182/113 (!) 174/110 (!) 187/117 (!) 170/99  Pulse:   89   Resp: 20 18 20 16   Temp:  98.1 F (36.7 C)  98 F (36.7 C)  TempSrc:    Oral  SpO2:  95% 97%   Weight:   48.4 kg   Height:        Intake/Output Summary (Last 24 hours) at 06/11/2019 1024 Last data filed at 06/10/2019 1705 Gross per 24 hour  Intake 560 ml  Output 1600 ml  Net -1040 ml   Filed Weights   06/09/19 0300 06/10/19 0500 06/11/19 0500  Weight: 46.6 kg 48.1 kg 48.4 kg    Examination:  General exam: No acute distress, disheveled in appearance Respiratory system: Clear to auscultation. Respiratory effort normal.  On room air Cardiovascular system: S1 & S2 heard, RRR. No JVD, murmurs, rubs, gallops or clicks. No pedal edema. Gastrointestinal system: Abdomen is nondistended, soft and nontender. No organomegaly or masses felt. Normal bowel sounds heard.  Colostomy noted with brown stool in bag Central nervous system: Alert and oriented x 1 (president Trump, Place Home, and Year 2021), moves all extremities independently  Extremities: Symmetric 5 x 5 power. Skin: Sacral fissure as depicted below Psychiatry: Flat affect, depressed mood; no agitation this morning      Data Reviewed: I have personally reviewed following labs and imaging studies  CBC: Recent Labs  Lab 06/05/19 1832 06/06/19 0003 06/07/19 0516  WBC 3.1* 4.5 2.7*  NEUTROABS 2.1  --   --   HGB 10.4* 10.2* 11.1*  HCT 33.4* 32.5* 34.8*  MCV 90.5 90.3 89.2  PLT 190 203 400   Basic Metabolic Panel: Recent Labs  Lab 06/07/19 0516  06/08/19 0809 06/09/19 0557 06/10/19 0411 06/10/19 1352 06/11/19 0601  NA 144  --  137 135 134*  --  137  K 3.0*   < > 3.5 3.7 3.2* 3.7 4.0  CL 113*  --  109 108 107  --  109  CO2 19*  --  16* 17* 16*  --  16*  GLUCOSE 99  --  93 97 102*  --  84  BUN 22*  --  13 9 8   --  11  CREATININE 1.57*  --  1.36* 1.48* 1.43*  --  1.42*  CALCIUM 9.4  --  9.5 9.3 9.0  --  9.4  MG 2.1  --  1.6* 1.6* 2.0  --  1.7  PHOS  --   --   --  3.4  --   --   --    < > =  values in this interval not displayed.   GFR: Estimated Creatinine Clearance: 35.8 mL/min (A) (by C-G formula based on SCr of 1.42 mg/dL (H)). Liver Function Tests: Recent Labs  Lab 06/05/19 1832  AST 27  ALT 15  ALKPHOS 77  BILITOT 0.6  PROT 7.4  ALBUMIN 3.5   Recent Labs  Lab 06/05/19 1832  LIPASE 56*   Recent Labs  Lab 06/06/19 0003  AMMONIA 35   Coagulation Profile: No results for input(s): INR, PROTIME in the last 168 hours. Cardiac Enzymes: No results for input(s): CKTOTAL, CKMB, CKMBINDEX, TROPONINI in the last 168 hours. BNP (last 3 results) No results for input(s): PROBNP in the last 8760 hours. HbA1C: No results for input(s): HGBA1C in the last 72 hours. CBG: Recent Labs  Lab 06/05/19 1826 06/06/19 0019 06/06/19 0232 06/07/19 1007  GLUCAP 79 91 82 90   Lipid Profile: No results for input(s): CHOL, HDL, LDLCALC, TRIG, CHOLHDL, LDLDIRECT in the last 72 hours. Thyroid Function Tests: No results for input(s): TSH,  T4TOTAL, FREET4, T3FREE, THYROIDAB in the last 72 hours. Anemia Panel: No results for input(s): VITAMINB12, FOLATE, FERRITIN, TIBC, IRON, RETICCTPCT in the last 72 hours. Sepsis Labs: Recent Labs  Lab 06/06/19 0004  LATICACIDVEN 1.0    Recent Results (from the past 240 hour(s))  SARS Coronavirus 2 [facility] order, Performed in [facility] hospital lab) Nasopharyngeal Urine, Clean Catch     Status: None   Collection Time: 06/05/19  9:01 PM   Specimen: Urine, Clean Catch; Nasopharyngeal  Result Value Ref Range Status   SARS Coronavirus 2 NEGATIVE NEGATIVE Final    Comment: (NOTE) If result is NEGATIVE SARS-CoV-2 target nucleic acids are NOT DETECTED. The SARS-CoV-2 RNA is generally detectable in upper and lower  respiratory specimens during the acute phase of infection. The lowest  concentration of SARS-CoV-2 viral copies this assay can detect is 250  copies / mL. A negative result does not preclude SARS-CoV-2 infection  and should not be used as the sole basis for treatment or other  patient management decisions.  A negative result may occur with  improper specimen collection / handling, submission of specimen other  than nasopharyngeal swab, presence of viral mutation(s) within the  areas targeted by this assay, and inadequate number of viral copies  (<250 copies / mL). A negative result must be combined with clinical  observations, patient history, and epidemiological information. If result is POSITIVE SARS-CoV-2 target nucleic acids are DETECTED. The SARS-CoV-2 RNA is generally detectable in upper and lower  respiratory specimens dur ing the acute phase of infection.  Positive  results are indicative of active infection with SARS-CoV-2.  Clinical  correlation with patient history and other diagnostic information is  necessary to determine patient infection status.  Positive results do  not rule out bacterial infection or co-infection with other viruses. If result is  PRESUMPTIVE POSTIVE SARS-CoV-2 nucleic acids MAY BE PRESENT.   A presumptive positive result was obtained on the submitted specimen  and confirmed on repeat testing.  While 2019 novel coronavirus  (SARS-CoV-2) nucleic acids may be present in the submitted sample  additional confirmatory testing may be necessary for epidemiological  and / or clinical management purposes  to differentiate between  SARS-CoV-2 and other Sarbecovirus currently known to infect humans.  If clinically indicated additional testing with an alternate test  methodology [phone]) is advised. The SARS-CoV-2 RNA is generally  detectable in upper and lower respiratory sp ecimens during the acute  phase of infection. The  expected result is Negative. Fact Sheet for Patients:  StrictlyIdeas.no Fact Sheet for Healthcare Providers: BankingDealers.co.za This test is not yet approved or cleared by the [place] FDA and has been authorized for detection and/or diagnosis of SARS-CoV-2 by FDA under an Emergency Use Authorization (EUA).  This EUA will remain in effect (meaning this test can be used) for the duration of the COVID-19 declaration under Section 564(b)(1) of the Act, 21 U.S.C. section 360bbb-3(b)(1), unless the authorization is terminated or revoked sooner. Performed at [facility], [place] [address] [place], [place] [address]   MRSA PCR Screening     Status: Abnormal   Collection Time: 06/06/19  5:47 AM   Specimen: Nasopharyngeal  Result Value Ref Range Status   MRSA by PCR POSITIVE (A) NEGATIVE Final    Comment:        The GeneXpert MRSA Assay (FDA approved for NASAL specimens only), is one component of a comprehensive MRSA colonization surveillance program. It is not intended to diagnose MRSA infection nor to guide or monitor treatment for MRSA infections. CRITICAL RESULT CALLED TO, READ BACK BY AND VERIFIED WITH: RN K CHARAPLIWY [phone] AT 65 AM  BY CM Performed at [facility], [place] [address], [place], [place] [address]          Radiology Studies: No results found.      Scheduled Meds: . amLODipine  5 mg Oral Daily  . chlorhexidine  15 mL Mouth Rinse BID  . [START ON 06/13/2019] cloNIDine  0.3 mg Transdermal Weekly  . enoxaparin (LOVENOX) injection  40 mg Subcutaneous Daily  . folic acid  1 mg Intravenous Daily  . LORazepam  0-4 mg Intravenous Q6H  . mouth rinse  15 mL Mouth Rinse q12n4p  . multivitamin with minerals  1 tablet Oral Daily  . QUEtiapine  25 mg Oral QHS  . thiamine  100 mg Oral Daily   Or  . thiamine  100 mg Intravenous Daily   Continuous Infusions:    LOS: 5 days    Time spent: 35 minutes spent on chart review, personally reviewing all imaging/laboratory studies, discussion with nursing staff, consultants, updating family and interview/physical exam; more than 50% of that time was spent in counseling and/or coordination of care.     J [place] DO [facility] Pager [phone]  If 7PM-7AM, please contact night-coverage [email or web address] Password [facility] 06/11/2019, 10:24 AM

## 2019-06-11 NOTE — Progress Notes (Signed)
Sacral wound dressing changed per MD order without difficulty.  Will continue to monitor.

## 2019-06-11 NOTE — Evaluation (Signed)
Physical Therapy Evaluation Patient Details Name: Sherry Baker MRN: [id number] DOB: 02-02-1966 Today's Date: 06/11/2019   History of Present Illness  53 year old woman with hx of polysubstance abuse, likely withdrawal seizures found in woods altered.  UDS with cocaine, EtOH 100. PMHx rectal cancer with resection/ostomy, chemoradiation, HTN, Bipolar 1, depression, CKD.  Now with acute delirium, question of Wernicke's raised at last admission.    Clinical Impression  Pt admitted with/for encephalopathy/delerium.  Pt needing min to max assist for mobility.  Pt currently limited functionally due to the problems listed. ( See problems list.)   Pt will benefit from PT to maximize function and safety in order to get ready for next venue listed below or home if pt changes significantly.     Follow Up Recommendations SNF(if continues with poor cognition, HHPT if has 24/7 assist)    Equipment Recommendations  Other (comment)(TBA)    Recommendations for Other Services       Precautions / Restrictions Precautions Precautions: Fall      Mobility  Bed Mobility Overal bed mobility: Needs Assistance Bed Mobility: Supine to Sit     Supine to sit: Min assist     General bed mobility comments: cues for direction, stability assist  Transfers Overall transfer level: Needs assistance   Transfers: Sit to/from Stand Sit to Stand: Min assist;Mod assist         General transfer comment: stability assist variable with standing surface, fatigue and focus.  Ambulation/Gait Ambulation/Gait assistance: Mod assist;Max assist Gait Distance (Feet): 20 Feet(x2) Assistive device: 1 person hand held assist Gait Pattern/deviations: Step-to pattern   Gait velocity interpretation: <1.31 ft/sec, indicative of household ambulator General Gait Details: antalfic, halting gait with effortful w/bearing on the L with some L LE trailing.  Stairs            Wheelchair Mobility    Modified Rankin  (Stroke Patients Only)       Balance Overall balance assessment: Needs assistance Sitting-balance support: No upper extremity supported Sitting balance-Leahy Scale: Fair       Standing balance-Leahy Scale: Poor Standing balance comment: reliant on external support                             Pertinent Vitals/Pain Pain Assessment: Faces Faces Pain Scale: Hurts little more Pain Location: ? L LE(seems antalgic) Pain Intervention(s): Monitored during session;Limited activity within patient's tolerance    Home Living Family/patient expects to be discharged to:: Private residence Living Arrangements: Non-relatives/Friends Available Help at Discharge: Friend(s);Other (Comment)(pt unable to relate how much she is alone) Type of Home: House       Home Layout: One level   Additional Comments: Environment, assist and DME need to be assessed further    Prior Function Level of Independence: Needs assistance   Gait / Transfers Assistance Needed: walking   "was rough"     Comments: nobody to ascertain PLOF     Hand Dominance        Extremity/Trunk Assessment   Upper Extremity Assessment Upper Extremity Assessment: Generalized weakness    Lower Extremity Assessment Lower Extremity Assessment: Difficult to assess due to impaired cognition;Generalized weakness(L LE gait halted "gimpy")       Communication   Communication: (garbled/slurred speech)  Cognition Arousal/Alertness: Awake/alert Behavior During Therapy: Restless;Flat affect Overall Cognitive Status: Impaired/Different from baseline Area of Impairment: Orientation;Attention;Following commands;Safety/judgement;Problem solving  Orientation Level: Person Current Attention Level: Focused   Following Commands: Follows one step commands inconsistently Safety/Judgement: Decreased awareness of safety;Decreased awareness of deficits   Problem Solving: Slow processing         General Comments General comments (skin integrity, edema, etc.): EHR in the 120's    Exercises     Assessment/Plan    PT Assessment Patient needs continued PT services  PT Problem List Decreased strength;Decreased activity tolerance;Decreased balance;Decreased mobility;Decreased coordination;Decreased safety awareness       PT Treatment Interventions Gait training;DME instruction;Functional mobility training;Therapeutic activities;Balance training;Patient/family education    PT Goals (Current goals can be found in the Care Plan section)  Acute Rehab PT Goals Patient Stated Goal: pt unable to participate with goals PT Goal Formulation: Patient unable to participate in goal setting Time For Goal Achievement: 06/25/19 Potential to Achieve Goals: Fair    Frequency Min 3X/week   Barriers to discharge        Co-evaluation               AM-PAC PT "6 Clicks" Mobility  Outcome Measure Help needed turning from your back to your side while in a flat bed without using bedrails?: A Little Help needed moving from lying on your back to sitting on the side of a flat bed without using bedrails?: A Lot Help needed moving to and from a bed to a chair (including a wheelchair)?: A Lot Help needed standing up from a chair using your arms (e.g., wheelchair or bedside chair)?: A Little Help needed to walk in hospital room?: A Lot Help needed climbing 3-5 steps with a railing? : A Lot 6 Click Score: 14    End of Session   Activity Tolerance: Other (comment)(limited by cogition, restlessness and  L LE discomfort.) Patient left: in bed;with call bell/phone within reach;with bed alarm set;with restraints reapplied Nurse Communication: Mobility status PT Visit Diagnosis: Unsteadiness on feet (R26.81);Other abnormalities of gait and mobility (R26.89);Difficulty in walking, not elsewhere classified (R26.2)    Time: 5449-2010 PT Time Calculation (min) (ACUTE ONLY): 29 min   Charges:   PT  Evaluation $PT Eval Moderate Complexity: 1 Mod PT Treatments $Gait Training: 8-22 mins        06/11/2019  Donnella Sham, PT Acute Rehabilitation Services [phone]  (pager) [phone]  (office)  Sherry Baker 06/11/2019, 3:44 PM

## 2019-06-12 LAB — BASIC METABOLIC PANEL
Anion gap: 14 (ref 5–15)
BUN: 14 mg/dL (ref 6–20)
CO2: 17 mmol/L — ABNORMAL LOW (ref 22–32)
Calcium: 9.5 mg/dL (ref 8.9–10.3)
Chloride: 105 mmol/L (ref 98–111)
Creatinine, Ser: 1.69 mg/dL — ABNORMAL HIGH (ref 0.44–1.00)
GFR calc Af Amer: 40 mL/min — ABNORMAL LOW (ref >60)
GFR calc non Af Amer: 35 mL/min — ABNORMAL LOW (ref >60)
Glucose, Bld: 85 mg/dL (ref 70–99)
Potassium: 3.6 mmol/L (ref 3.5–5.1)
Sodium: 136 mmol/L (ref 135–145)

## 2019-06-12 LAB — GLUCOSE, CAPILLARY
Glucose-Capillary: 79 mg/dL (ref 70–99)
Glucose-Capillary: 92 mg/dL (ref 70–99)
Glucose-Capillary: 98 mg/dL (ref 70–99)
Glucose-Capillary: 79 mg/dL (ref 70–99)

## 2019-06-12 LAB — MAGNESIUM: Magnesium: 1.9 mg/dL (ref 1.7–2.4)

## 2019-06-12 MED ORDER — POTASSIUM CHLORIDE CRYS ER 20 MEQ PO TBCR
40.0000 meq | EXTENDED_RELEASE_TABLET | Freq: Once | ORAL | Status: AC
  Administered 2019-06-12: 40 meq via ORAL
  Filled 2019-06-12: qty 2

## 2019-06-12 MED ORDER — NICOTINE 21 MG/24HR TD PT24
21.0000 mg | MEDICATED_PATCH | Freq: Every day | TRANSDERMAL | Status: DC
  Administered 2019-06-13: 21 mg via TRANSDERMAL
  Filled 2019-06-12 (×3): qty 1

## 2019-06-12 MED ORDER — AMLODIPINE BESYLATE 10 MG PO TABS
10.0000 mg | ORAL_TABLET | Freq: Every day | ORAL | Status: DC
  Administered 2019-06-12 – 2019-06-14 (×3): 10 mg via ORAL
  Filled 2019-06-12 (×3): qty 1

## 2019-06-12 NOTE — Plan of Care (Signed)

## 2019-06-12 NOTE — Progress Notes (Signed)
CSW attempted to acknowledged consult however due to patient only being orientated to self will continue to monitor to see if cognition improves. Emergency contacts were not family.

## 2019-06-12 NOTE — Progress Notes (Signed)
Pt threatening to leave and wanted cigarettes. Pt wanted purse. RN went to hand pt her belongings and found beer, lighters and cigarettes. Items removed for patient's safety and pt informed that belongings will be returned to her at discharge. Notified security. Security can not take possession of personal items. Pt belongings secured at nurses station.  Clyde Canterbury, RN

## 2019-06-12 NOTE — Progress Notes (Signed)
PROGRESS NOTE    Sherry Baker  NAT:[id number] DOB: 1965-11-07 DOA: 06/05/2019 PCP: Patient, No Pcp Per    Brief Narrative:  Sherry Baker is a 53 y.o. female with known history of rectal cancer status post colostomy with history of polysubstance abuse admitted last month July 15 through July 18 for altered mental status at the time patient was witnessed to have seizures likely precipitated by polysubstance abuse hypoglycemic episodes and work-up was largely unremarkable with MRI EEG being negative advised follow-up with neurologist was brought to the ER after patient was found to be altered mental status in the woods.  Most of the history obtained from ER physician and previous records and has no family was available and at the time of my exam patient is being sedated after being given Ativan.  ED Course: In the ER patient became alert awake and labs were at baseline.  EKG shows normal sinus rhythm chest x-ray unremarkable alcohol level is positive COVID test was negative and urine drug screen was positive for cocaine.  Once patient became alert awake plan was to discharge patient back and when patient became altered and had a seizure-like episode which was generalized tonic-clonic and became confused and was given Ativan 2 mg IV.  CT of the head was unremarkable on-call neurologist was consulted by the ER physician who advised at this time no neurology work-up is required.  Given the patient's ongoing confused state and seizure-like episode patient admitted for observation.   Assessment & Plan:   Principal Problem:   Acute encephalopathy Active Problems:   Hypertensive urgency   CKD (chronic kidney disease), stage III (HCC)   Normochromic normocytic anemia   Pressure injury of skin   Acute metabolic encephalopathy   Acute metabolic encephalopathy EtOH withdrawal Patient presenting after being found in the woods altered.  Recent hospitalization late July for similar episodes related to  underlying substance abuse.  During that hospitalization she was witnessed to have seizures likely participated by her substance abuse combined with hypoglycemic episodes.  Extensive work-up including MRI and EEG were unrevealing.  Case was discussed with on-call neurology, Dr. Malen Gauze this admission who advised no further neurological work-up and no anti-elliptic medications.  Ammonia level normal 35.  Elevated at 100.  UDS positive for cocaine.  Lactic acid 1.0.  CT head without acute disease process. PCCM consulted on 8/4 for assistance given her significant withdrawals for consideration of ICU level of care and Precedex; no indication for ICU transfer at that time. --Mentation continues to slightly wax and wane, although much improved over the past few days --Discontinued scheduled Ativan on 06/10/2019 --Continue CIWAA with prn ativan and haldol; decreased use over last 2 days --continue Seroquel 25 mg p.o. nightly --Speech therapy evaluated with recommendations of dysphasia 3 diet --Monitor blood sugars closely with history of hypoglycemic episodes previously, given her current mental status --B12, iron, thiamine, folate, MVI  --attempt out of restraints today  Polysubstance abuse UDS positive for cocaine, EtOH level elevated 100 on admission. --CIWAA protocol --Thiamine, folic acid, multivitamin --Supportive care --PT with recommendation of SNF if no 24/7 hour care; patient with unclear living situation; was found in woods. --Social work consult for discussion of rehab options and may need placement; but likely will be noncompliant based on her history  Hypertensive urgency Blood pressure elevated to 218/124 admission.  Positive for cocaine on UDS.  --Continue clonidine 0.3 mg patch --Metoprolol 5 mg IV q6h prn --Hydralazine IV prn --increased amlodipine from 5 mg  to 10mg  p.o. daily on 06/12/2019  Anemia Hemoglobin 10.2 with MCV 90.3 on admission.  Stable.  No signs of blood loss.   History of rectal cancer status post colostomy --Wound care/ostomy consult  Pressure ulcer, present on admission --Sacral/gluteal cleft fissure noted --Wound care following  Hypokalemia / hypomagnesemia Potassium 3.6 and magnesium 1.9 this morning.  Will replete K --Repeat electrolytes to include magnesium in the a.m.    DVT prophylaxis: Lovenox Code Status: Full code Family Communication: None Disposition Plan: Continue inpatient, further depending on clinical course, PT recs SNF if no 24/7 care; patient with poor living situation; SW consult pending.   Consultants:   Neurology - Dr. Malen Gauze  PCCM - Dr. Tamala Julian  Procedures:   None  Antimicrobials:   None   Subjective: Patient seen and examined at bedside, continues in restraints.  Tearful.  Less use of Ativan and Haldol over the past 24 hours.  Patient believes she is at her aunt's house, but knows it is 2020 and that the president is Trump.  No other specific complaints brought up this morning.  Denies headache, no fever/chills/night sweats, no nausea/vomiting/diarrhea, no chest pain, palpitations, no abdominal pain.  No acute events overnight per nursing staff.  Objective: Vitals:   06/11/19 2000 06/11/19 2300 06/12/19 0551 06/12/19 0700  BP: (!) 168/100 122/78 (!) 162/90 (!) 176/80  Pulse:      Resp: 18 (!) 30 14 13   Temp: 97.9 F (36.6 C) (!) 97.5 F (36.4 C) 98.5 F (36.9 C)   TempSrc: Axillary Axillary Oral   SpO2:   100%   Weight:   47.2 kg   Height:   5\' 2"  (1.575 m)     Intake/Output Summary (Last 24 hours) at 06/12/2019 1056 Last data filed at 06/12/2019 1039 Gross per 24 hour  Intake 420 ml  Output 400 ml  Net 20 ml   Filed Weights   06/10/19 0500 06/11/19 0500 06/12/19 0551  Weight: 48.1 kg 48.4 kg 47.2 kg    Examination:  General exam: No acute distress, disheveled in appearance, in bilateral soft wrist restraints Respiratory system: Clear to auscultation. Respiratory effort normal.  On room  air Cardiovascular system: S1 & S2 heard, RRR. No JVD, murmurs, rubs, gallops or clicks. No pedal edema. Gastrointestinal system: Abdomen is nondistended, soft and nontender. No organomegaly or masses felt. Normal bowel sounds heard.  Colostomy noted with brown stool in bag Central nervous system: Alert and oriented x 2 (president Trump, Place and spouse, and Year 2020), moves all extremities independently Extremities: Symmetric 5 x 5 power. Skin: Sacral fissure/wound as depicted below Psychiatry: Flat affect, depressed mood; no agitation this morning      Data Reviewed: I have personally reviewed following labs and imaging studies  CBC: Recent Labs  Lab 06/05/19 1832 06/06/19 0003 06/07/19 0516  WBC 3.1* 4.5 2.7*  NEUTROABS 2.1  --   --   HGB 10.4* 10.2* 11.1*  HCT 33.4* 32.5* 34.8*  MCV 90.5 90.3 89.2  PLT 190 203 176   Basic Metabolic Panel: Recent Labs  Lab 06/08/19 0809 06/09/19 0557 06/10/19 0411 06/10/19 1352 06/11/19 0601 06/12/19 0305  NA 137 135 134*  --  137 136  K 3.5 3.7 3.2* 3.7 4.0 3.6  CL 109 108 107  --  109 105  CO2 16* 17* 16*  --  16* 17*  GLUCOSE 93 97 102*  --  84 85  BUN 13 9 8   --  11 14  CREATININE 1.36*  1.48* 1.43*  --  1.42* 1.69*  CALCIUM 9.5 9.3 9.0  --  9.4 9.5  MG 1.6* 1.6* 2.0  --  1.7 1.9  PHOS  --  3.4  --   --   --   --    GFR: Estimated Creatinine Clearance: 29.3 mL/min (A) (by C-G formula based on SCr of 1.69 mg/dL (H)). Liver Function Tests: Recent Labs  Lab 06/05/19 1832  AST 27  ALT 15  ALKPHOS 77  BILITOT 0.6  PROT 7.4  ALBUMIN 3.5   Recent Labs  Lab 06/05/19 1832  LIPASE 56*   Recent Labs  Lab 06/06/19 0003  AMMONIA 35   Coagulation Profile: No results for input(s): INR, PROTIME in the last 168 hours. Cardiac Enzymes: No results for input(s): CKTOTAL, CKMB, CKMBINDEX, TROPONINI in the last 168 hours. BNP (last 3 results) No results for input(s): PROBNP in the last 8760 hours. HbA1C: No results for  input(s): HGBA1C in the last 72 hours. CBG: Recent Labs  Lab 06/07/19 1007 06/11/19 1351 06/11/19 1603 06/11/19 2037 06/12/19 0610  GLUCAP 90 72 83 82 79   Lipid Profile: No results for input(s): CHOL, HDL, LDLCALC, TRIG, CHOLHDL, LDLDIRECT in the last 72 hours. Thyroid Function Tests: No results for input(s): TSH, T4TOTAL, FREET4, T3FREE, THYROIDAB in the last 72 hours. Anemia Panel: No results for input(s): VITAMINB12, FOLATE, FERRITIN, TIBC, IRON, RETICCTPCT in the last 72 hours. Sepsis Labs: Recent Labs  Lab 06/06/19 0004  LATICACIDVEN 1.0    Recent Results (from the past 240 hour(s))  SARS Coronavirus 2 [facility] order, Performed in [facility] hospital lab) Nasopharyngeal Urine, Clean Catch     Status: None   Collection Time: 06/05/19  9:01 PM   Specimen: Urine, Clean Catch; Nasopharyngeal  Result Value Ref Range Status   SARS Coronavirus 2 NEGATIVE NEGATIVE Final    Comment: (NOTE) If result is NEGATIVE SARS-CoV-2 target nucleic acids are NOT DETECTED. The SARS-CoV-2 RNA is generally detectable in upper and lower  respiratory specimens during the acute phase of infection. The lowest  concentration of SARS-CoV-2 viral copies this assay can detect is 250  copies / mL. A negative result does not preclude SARS-CoV-2 infection  and should not be used as the sole basis for treatment or other  patient management decisions.  A negative result may occur with  improper specimen collection / handling, submission of specimen other  than nasopharyngeal swab, presence of viral mutation(s) within the  areas targeted by this assay, and inadequate number of viral copies  (<250 copies / mL). A negative result must be combined with clinical  observations, patient history, and epidemiological information. If result is POSITIVE SARS-CoV-2 target nucleic acids are DETECTED. The SARS-CoV-2 RNA is generally detectable in upper and lower  respiratory specimens dur ing the acute phase of  infection.  Positive  results are indicative of active infection with SARS-CoV-2.  Clinical  correlation with patient history and other diagnostic information is  necessary to determine patient infection status.  Positive results do  not rule out bacterial infection or co-infection with other viruses. If result is PRESUMPTIVE POSTIVE SARS-CoV-2 nucleic acids MAY BE PRESENT.   A presumptive positive result was obtained on the submitted specimen  and confirmed on repeat testing.  While 2019 novel coronavirus  (SARS-CoV-2) nucleic acids may be present in the submitted sample  additional confirmatory testing may be necessary for epidemiological  and / or clinical management purposes  to differentiate between  SARS-CoV-2 and other Sarbecovirus  currently known to infect humans.  If clinically indicated additional testing with an alternate test  methodology [phone]) is advised. The SARS-CoV-2 RNA is generally  detectable in upper and lower respiratory sp ecimens during the acute  phase of infection. The expected result is Negative. Fact Sheet for Patients:  StrictlyIdeas.no Fact Sheet for Healthcare Providers: BankingDealers.co.za This test is not yet approved or cleared by the [place] FDA and has been authorized for detection and/or diagnosis of SARS-CoV-2 by FDA under an Emergency Use Authorization (EUA).  This EUA will remain in effect (meaning this test can be used) for the duration of the COVID-19 declaration under Section 564(b)(1) of the Act, 21 U.S.C. section 360bbb-3(b)(1), unless the authorization is terminated or revoked sooner. Performed at [facility], [place] [address] [place], [place] [address]   MRSA PCR Screening     Status: Abnormal   Collection Time: 06/06/19  5:47 AM   Specimen: Nasopharyngeal  Result Value Ref Range Status   MRSA by PCR POSITIVE (A) NEGATIVE Final    Comment:        The GeneXpert MRSA Assay  (FDA approved for NASAL specimens only), is one component of a comprehensive MRSA colonization surveillance program. It is not intended to diagnose MRSA infection nor to guide or monitor treatment for MRSA infections. CRITICAL RESULT CALLED TO, READ BACK BY AND VERIFIED WITH: RN K CHARAPLIWY [id number] AT 60 AM BY CM Performed at [facility], [place] [address], [place], [place] [address]          Radiology Studies: No results found.      Scheduled Meds: . amLODipine  10 mg Oral Daily  . chlorhexidine  15 mL Mouth Rinse BID  . [START ON 06/13/2019] cloNIDine  0.3 mg Transdermal Weekly  . enoxaparin (LOVENOX) injection  40 mg Subcutaneous Daily  . folic acid  1 mg Oral Daily  . LORazepam  0-4 mg Intravenous Q6H  . mouth rinse  15 mL Mouth Rinse q12n4p  . multivitamin with minerals  1 tablet Oral Daily  . QUEtiapine  25 mg Oral QHS  . thiamine  100 mg Oral Daily   Continuous Infusions:    LOS: 6 days    Time spent: 35 minutes spent on chart review, personally reviewing all imaging/laboratory studies, discussion with nursing staff, consultants, updating family and interview/physical exam; more than 50% of that time was spent in counseling and/or coordination of care.     J [place] DO [facility] Pager [phone]  If 7PM-7AM, please contact night-coverage [email or web address] Password [email or web address] 06/12/2019, 10:56 AM

## 2019-06-13 ENCOUNTER — Other Ambulatory Visit: Payer: Self-pay

## 2019-06-13 ENCOUNTER — Encounter: Payer: Self-pay | Admitting: Diagnostic Neuroimaging

## 2019-06-13 LAB — BASIC METABOLIC PANEL
Anion gap: 12 (ref 5–15)
BUN: 28 mg/dL — ABNORMAL HIGH (ref 6–20)
CO2: 19 mmol/L — ABNORMAL LOW (ref 22–32)
Calcium: 9.1 mg/dL (ref 8.9–10.3)
Chloride: 107 mmol/L (ref 98–111)
Creatinine, Ser: 2.22 mg/dL — ABNORMAL HIGH (ref 0.44–1.00)
GFR calc Af Amer: 29 mL/min — ABNORMAL LOW (ref >60)
GFR calc non Af Amer: 25 mL/min — ABNORMAL LOW (ref >60)
Glucose, Bld: 103 mg/dL — ABNORMAL HIGH (ref 70–99)
Potassium: 4.1 mmol/L (ref 3.5–5.1)
Sodium: 138 mmol/L (ref 135–145)

## 2019-06-13 LAB — MAGNESIUM: Magnesium: 2 mg/dL (ref 1.7–2.4)

## 2019-06-13 LAB — GLUCOSE, CAPILLARY: Glucose-Capillary: 87 mg/dL (ref 70–99)

## 2019-06-13 MED ORDER — SODIUM CHLORIDE 0.9 % IV BOLUS
1000.0000 mL | Freq: Once | INTRAVENOUS | Status: AC
  Administered 2019-06-13: 1000 mL via INTRAVENOUS

## 2019-06-13 MED ORDER — ENOXAPARIN SODIUM 30 MG/0.3ML ~~LOC~~ SOLN
30.0000 mg | Freq: Every day | SUBCUTANEOUS | Status: DC
  Administered 2019-06-14: 08:00:00 30 mg via SUBCUTANEOUS
  Filled 2019-06-13: qty 0.3

## 2019-06-13 NOTE — Progress Notes (Signed)
PROGRESS NOTE    Sherry Baker  IPJ:[id number] DOB: [birth year] DOA: 06/05/2019 PCP: Patient, No Pcp Per    Brief Narrative:  Sherry Baker is a 53 y.o. female with known history of rectal cancer status post colostomy with history of polysubstance abuse admitted last month July 15 through July 18 for altered mental status at the time patient was witnessed to have seizures likely precipitated by polysubstance abuse hypoglycemic episodes and work-up was largely unremarkable with MRI EEG being negative advised follow-up with neurologist was brought to the ER after patient was found to be altered mental status in the woods.  Most of the history obtained from ER physician and previous records and has no family was available and at the time of my exam patient is being sedated after being given Ativan.  ED Course: In the ER patient became alert awake and labs were at baseline.  EKG shows normal sinus rhythm chest x-ray unremarkable alcohol level is positive COVID test was negative and urine drug screen was positive for cocaine.  Once patient became alert awake plan was to discharge patient back and when patient became altered and had a seizure-like episode which was generalized tonic-clonic and became confused and was given Ativan 2 mg IV.  CT of the head was unremarkable on-call neurologist was consulted by the ER physician who advised at this time no neurology work-up is required.  Given the patient's ongoing confused state and seizure-like episode patient admitted for observation.   Assessment & Plan:   Principal Problem:   Acute encephalopathy Active Problems:   Hypertensive urgency   CKD (chronic kidney disease), stage III (HCC)   Normochromic normocytic anemia   Pressure injury of skin   Acute metabolic encephalopathy   Acute metabolic encephalopathy EtOH withdrawal Patient presenting after being found in the woods altered.  Recent hospitalization late July for similar episodes related to  underlying substance abuse.  During that hospitalization she was witnessed to have seizures likely participated by her substance abuse combined with hypoglycemic episodes.  Extensive work-up including MRI and EEG were unrevealing.  Case was discussed with on-call neurology, Dr. Malen Gauze this admission who advised no further neurological work-up and no anti-elliptic medications.  Ammonia level normal 35.  Elevated at 100.  UDS positive for cocaine.  Lactic acid 1.0.  CT head without acute disease process. PCCM consulted on 8/4 for assistance given her significant withdrawals for consideration of ICU level of care and Precedex; no indication for ICU transfer at that time. --Mentation slowly improving --Discontinued scheduled Ativan on 06/10/2019 --Continue CIWAA with prn ativan and haldol; received one dose of haldol and one dose ativan past 24h --continue Seroquel 25 mg p.o. nightly --Speech therapy evaluated with recommendations of dysphasia 3 diet --Monitor blood sugars closely with history of hypoglycemic episodes previously, given her current mental status --B12, iron, thiamine, folate, MVI   Acute renal failure Creatinine 1.97 on admission.  Improved to 1.36, but now trending back up now 2.22, etiology likely poor oral intake. --NS 1 L bolus today; will try to avoid continuous IV fluid infusion given her previous agitation/withdrawal --Encourage increased oral intake --Repeat renal function in the a.m.  Polysubstance abuse UDS positive for cocaine, EtOH level elevated 100 on admission. --CIWAA protocol --Thiamine, folic acid, multivitamin --Supportive care --PT with recommendation of SNF if no 24/7 hour care; patient with unclear living situation; was found in woods. --Social work consult for discussion of rehab options and may need placement; but likely will be noncompliant  based on her history  Hypertensive urgency Blood pressure elevated to 218/124 admission.  Positive for cocaine on UDS.   --Continue clonidine 0.3 mg patch --Metoprolol 5 mg IV q6h prn --Hydralazine IV prn --increased amlodipine from 5 mg to 10mg  p.o. daily on 06/12/2019  Anemia Hemoglobin 10.2 with MCV 90.3 on admission.  Stable.  No signs of blood loss.  History of rectal cancer status post colostomy --Wound care/ostomy consult  Pressure ulcer, present on admission --Sacral/gluteal cleft fissure noted --Wound care following  Hypokalemia / hypomagnesemia Potassium 4.1 and magnesium 2.0 this morning.   --Repeat electrolytes to include magnesium in the a.m.  Weakness/debility: --PT/OT --SW; may have placement issues    DVT prophylaxis: Lovenox Code Status: Full code Family Communication: None Disposition Plan: Continue inpatient, further depending on clinical course, PT recs SNF if no 24/7 care; patient with poor living situation; SW consult pending.   Consultants:   Neurology - Dr. Malen Gauze  PCCM - Dr. Tamala Julian  Procedures:   None  Antimicrobials:   None   Subjective: Patient seen and examined at bedside, out of restraints today.  Appears calm and comfortable.  Now alert and oriented x3.  Only required 1 dose of Ativan and Haldol past 24 hours.  Less use of Ativan and Haldol over the past 24 hours. No other specific complaints brought up this morning.  Denies headache, no fever/chills/night sweats, no nausea/vomiting/diarrhea, no chest pain, palpitations, no abdominal pain.  No acute events overnight per nursing staff.  Objective: Vitals:   06/13/19 0926 06/13/19 0927 06/13/19 0928 06/13/19 0929  BP:      Pulse: 92 93 94 96  Resp: 16 17 17 15   Temp:      TempSrc:      SpO2: 100% 100% 100% 100%  Weight:      Height:        Intake/Output Summary (Last 24 hours) at 06/13/2019 1009 Last data filed at 06/13/2019 0800 Gross per 24 hour  Intake 1020 ml  Output 620 ml  Net 400 ml   Filed Weights   06/11/19 0500 06/12/19 0551 06/13/19 0600  Weight: 48.4 kg 47.2 kg 48.4 kg     Examination:  General exam: No acute distress, disheveled in appearance, in bilateral soft wrist restraints Respiratory system: Clear to auscultation. Respiratory effort normal.  On room air Cardiovascular system: S1 & S2 heard, RRR. No JVD, murmurs, rubs, gallops or clicks. No pedal edema. Gastrointestinal system: Abdomen is nondistended, soft and nontender. No organomegaly or masses felt. Normal bowel sounds heard.  Colostomy noted with brown stool in bag Central nervous system: Alert and oriented x 3 (president: Milinda Pointer, Place: [facility], and Year: 2020), moves all extremities independently Extremities: Symmetric 5 x 5 power. Skin: Sacral fissure/wound as depicted below Psychiatry: Flat affect, depressed mood; no agitation this morning      Data Reviewed: I have personally reviewed following labs and imaging studies  CBC: Recent Labs  Lab 06/07/19 0516  WBC 2.7*  HGB 11.1*  HCT 34.8*  MCV 89.2  PLT 825   Basic Metabolic Panel: Recent Labs  Lab 06/09/19 0557 06/10/19 0411 06/10/19 1352 06/11/19 0601 06/12/19 0305 06/13/19 0809  NA 135 134*  --  137 136 138  K 3.7 3.2* 3.7 4.0 3.6 4.1  CL 108 107  --  109 105 107  CO2 17* 16*  --  16* 17* 19*  GLUCOSE 97 102*  --  84 85 103*  BUN 9 8  --  11 14  28*  CREATININE 1.48* 1.43*  --  1.42* 1.69* 2.22*  CALCIUM 9.3 9.0  --  9.4 9.5 9.1  MG 1.6* 2.0  --  1.7 1.9 2.0  PHOS 3.4  --   --   --   --   --    GFR: Estimated Creatinine Clearance: 22.9 mL/min (A) (by C-G formula based on SCr of 2.22 mg/dL (H)). Liver Function Tests: No results for input(s): AST, ALT, ALKPHOS, BILITOT, PROT, ALBUMIN in the last 168 hours. No results for input(s): LIPASE, AMYLASE in the last 168 hours. No results for input(s): AMMONIA in the last 168 hours. Coagulation Profile: No results for input(s): INR, PROTIME in the last 168 hours. Cardiac Enzymes: No results for input(s): CKTOTAL, CKMB, CKMBINDEX, TROPONINI in the last 168 hours. BNP (last 3  results) No results for input(s): PROBNP in the last 8760 hours. HbA1C: No results for input(s): HGBA1C in the last 72 hours. CBG: Recent Labs  Lab 06/12/19 0610 06/12/19 1108 06/12/19 1538 06/12/19 2219 06/13/19 0604  GLUCAP 79 92 98 79 87   Lipid Profile: No results for input(s): CHOL, HDL, LDLCALC, TRIG, CHOLHDL, LDLDIRECT in the last 72 hours. Thyroid Function Tests: No results for input(s): TSH, T4TOTAL, FREET4, T3FREE, THYROIDAB in the last 72 hours. Anemia Panel: No results for input(s): VITAMINB12, FOLATE, FERRITIN, TIBC, IRON, RETICCTPCT in the last 72 hours. Sepsis Labs: No results for input(s): PROCALCITON, LATICACIDVEN in the last 168 hours.  Recent Results (from the past 240 hour(s))  SARS Coronavirus 2 [facility] order, Performed in [facility] hospital lab) Nasopharyngeal Urine, Clean Catch     Status: None   Collection Time: 06/05/19  9:01 PM   Specimen: Urine, Clean Catch; Nasopharyngeal  Result Value Ref Range Status   SARS Coronavirus 2 NEGATIVE NEGATIVE Final    Comment: (NOTE) If result is NEGATIVE SARS-CoV-2 target nucleic acids are NOT DETECTED. The SARS-CoV-2 RNA is generally detectable in upper and lower  respiratory specimens during the acute phase of infection. The lowest  concentration of SARS-CoV-2 viral copies this assay can detect is 250  copies / mL. A negative result does not preclude SARS-CoV-2 infection  and should not be used as the sole basis for treatment or other  patient management decisions.  A negative result may occur with  improper specimen collection / handling, submission of specimen other  than nasopharyngeal swab, presence of viral mutation(s) within the  areas targeted by this assay, and inadequate number of viral copies  (<250 copies / mL). A negative result must be combined with clinical  observations, patient history, and epidemiological information. If result is POSITIVE SARS-CoV-2 target nucleic acids are DETECTED. The  SARS-CoV-2 RNA is generally detectable in upper and lower  respiratory specimens dur ing the acute phase of infection.  Positive  results are indicative of active infection with SARS-CoV-2.  Clinical  correlation with patient history and other diagnostic information is  necessary to determine patient infection status.  Positive results do  not rule out bacterial infection or co-infection with other viruses. If result is PRESUMPTIVE POSTIVE SARS-CoV-2 nucleic acids MAY BE PRESENT.   A presumptive positive result was obtained on the submitted specimen  and confirmed on repeat testing.  While 2019 novel coronavirus  (SARS-CoV-2) nucleic acids may be present in the submitted sample  additional confirmatory testing may be necessary for epidemiological  and / or clinical management purposes  to differentiate between  SARS-CoV-2 and other Sarbecovirus currently known to infect humans.  If clinically  indicated additional testing with an alternate test  methodology [phone]) is advised. The SARS-CoV-2 RNA is generally  detectable in upper and lower respiratory sp ecimens during the acute  phase of infection. The expected result is Negative. Fact Sheet for Patients:  StrictlyIdeas.no Fact Sheet for Healthcare Providers: BankingDealers.co.za This test is not yet approved or cleared by the [place] FDA and has been authorized for detection and/or diagnosis of SARS-CoV-2 by FDA under an Emergency Use Authorization (EUA).  This EUA will remain in effect (meaning this test can be used) for the duration of the COVID-19 declaration under Section 564(b)(1) of the Act, 21 U.S.C. section 360bbb-3(b)(1), unless the authorization is terminated or revoked sooner. Performed at [facility], [place] [employer] [place] [address], [place], [place] [address]   MRSA PCR Screening     Status: Abnormal   Collection Time: 06/06/19  5:47 AM   Specimen: Nasopharyngeal  Result  Value Ref Range Status   MRSA by PCR POSITIVE (A) NEGATIVE Final    Comment:        The GeneXpert MRSA Assay (FDA approved for NASAL specimens only), is one component of a comprehensive MRSA colonization surveillance program. It is not intended to diagnose MRSA infection nor to guide or monitor treatment for MRSA infections. CRITICAL RESULT CALLED TO, READ BACK BY AND VERIFIED WITH: RN K CHARAPLIWY [id number] AT 75 AM BY CM Performed at [facility], [place] [address], [place], [place] [address]          Radiology Studies: No results found.      Scheduled Meds: . amLODipine  10 mg Oral Daily  . chlorhexidine  15 mL Mouth Rinse BID  . cloNIDine  0.3 mg Transdermal Weekly  . enoxaparin (LOVENOX) injection  40 mg Subcutaneous Daily  . folic acid  1 mg Oral Daily  . LORazepam  0-4 mg Intravenous Q6H  . mouth rinse  15 mL Mouth Rinse q12n4p  . multivitamin with minerals  1 tablet Oral Daily  . nicotine  21 mg Transdermal Daily  . QUEtiapine  25 mg Oral QHS  . thiamine  100 mg Oral Daily   Continuous Infusions: . sodium chloride       LOS: 7 days    Time spent: 35 minutes spent on chart review, personally reviewing all imaging/laboratory studies, discussion with nursing staff, consultants, updating family and interview/physical exam; more than 50% of that time was spent in counseling and/or coordination of care.    Miral Hoopes J [place] DO [facility] Pager [phone]  If 7PM-7AM, please contact night-coverage [email or web address] Password [email or web address] 06/13/2019, 10:09 AM

## 2019-06-13 NOTE — Progress Notes (Signed)
Pt is resting in bed eyes closed. Pt did not have the restraints al night and did not required no ativan throughout the night . Patient mostly sleep through the night and did get up twice to use the bathroom. In all two occasions patient were alert, but pleasantry confused. SBP and HR were stable through the night.

## 2019-06-13 NOTE — Progress Notes (Signed)
Physical Therapy Treatment Patient Details Name: Sherry Baker MRN: [id number] DOB: 02-05-1966 Today's Date: 06/13/2019    History of Present Illness 53 year old woman with hx of polysubstance abuse, likely withdrawal seizures found in woods altered.  UDS with cocaine, EtOH 100. PMHx rectal cancer with resection/ostomy, chemoradiation, HTN, Bipolar 1, depression, CKD.  Now with acute delirium, question of Wernicke's raised at last admission.      PT Comments    Pt's cognition has made great improvements. Pt is agreeable to therapy today and is making good progress towards her goals. Pt continues to be limites in safe mobility by decreased safety awareness and generalized weakness. Pt is min A for bed mobility, min guard for transfers and min A for ambulation of 200 feet with RW. Pt reports not wanting to go to SNF and can stay with friend that is available 24 hours a day. With 24 hour assist PT recommending HHPT and RW for possible d/c tomorrow. PT will continue to follow acutely.   Follow Up Recommendations  Home health PT;Supervision/Assistance - 24 hour     Equipment Recommendations  Rolling walker with 5" wheels       Precautions / Restrictions Precautions Precautions: Fall Restrictions Weight Bearing Restrictions: No    Mobility  Bed Mobility Overal bed mobility: Needs Assistance Bed Mobility: Supine to Sit     Supine to sit: Min assist;HOB elevated     General bed mobility comments: min A for pad scoot of hips to EoB   Transfers Overall transfer level: Needs assistance   Transfers: Sit to/from Stand Sit to Stand: Min guard         General transfer comment: min guard for safety, vc for hand placement able to power up and self steady without assist however requires momentum to power up   Ambulation/Gait Ambulation/Gait assistance: Min assist Gait Distance (Feet): 200 Feet Assistive device: Rolling walker (2 wheeled) Gait Pattern/deviations: Step-through  pattern;Antalgic;Trunk flexed;Decreased weight shift to left Gait velocity: slowed Gait velocity interpretation: <1.8 ft/sec, indicate of risk for recurrent falls General Gait Details: min A for steadying, decreased weightshift to L and decreased L foot clearance, constant multimodal cues for proximity to RW          Balance Overall balance assessment: Needs assistance Sitting-balance support: No upper extremity supported Sitting balance-Leahy Scale: Fair       Standing balance-Leahy Scale: Poor Standing balance comment: reliant on external support                            Cognition Arousal/Alertness: Awake/alert Behavior During Therapy: WFL for tasks assessed/performed Overall Cognitive Status: Impaired/Different from baseline Area of Impairment: Attention;Following commands;Safety/judgement;Problem solving                   Current Attention Level: Alternating   Following Commands: Follows multi-step commands consistently Safety/Judgement: Decreased awareness of deficits;Decreased awareness of safety   Problem Solving: Slow processing;Requires verbal cues;Requires tactile cues General Comments: cognition is improving, still has difficulty with safety awareness and slow processing         General Comments General comments (skin integrity, edema, etc.): VSS      Pertinent Vitals/Pain Pain Assessment: Faces Faces Pain Scale: Hurts a little bit Pain Location: ? L LE(seems antalgic)           PT Goals (current goals can now be found in the care plan section) Acute Rehab PT Goals Patient Stated Goal: pt  unable to participate with goals PT Goal Formulation: With patient Time For Goal Achievement: 06/25/19 Potential to Achieve Goals: Fair Progress towards PT goals: Progressing toward goals    Frequency    Min 3X/week      PT Plan Discharge plan needs to be updated       AM-PAC PT "6 Clicks" Mobility   Outcome Measure  Help needed  turning from your back to your side while in a flat bed without using bedrails?: A Little Help needed moving from lying on your back to sitting on the side of a flat bed without using bedrails?: A Little Help needed moving to and from a bed to a chair (including a wheelchair)?: A Little Help needed standing up from a chair using your arms (e.g., wheelchair or bedside chair)?: A Little Help needed to walk in hospital room?: A Little Help needed climbing 3-5 steps with a railing? : A Lot 6 Click Score: 17    End of Session Equipment Utilized During Treatment: Gait belt Activity Tolerance: Other (comment)(limited by cogition, restlessness and  L LE discomfort.) Patient left: in bed;with call bell/phone within reach;with bed alarm set;with restraints reapplied Nurse Communication: Mobility status PT Visit Diagnosis: Unsteadiness on feet (R26.81);Other abnormalities of gait and mobility (R26.89);Difficulty in walking, not elsewhere classified (R26.2)     Time: 1336-1400 PT Time Calculation (min) (ACUTE ONLY): 24 min  Charges:  $Gait Training: 23-37 mins                     Sharline Lehane B. Migdalia Dk PT, DPT Acute Rehabilitation Services Pager [phone] Office [phone]    Organ 06/13/2019, 4:29 PM

## 2019-06-13 NOTE — TOC Initial Note (Signed)
Transition of Care [facility]) - Initial/Assessment Note    Patient Details  Name: Sherry Baker MRN: [id number] Date of Birth: 1966/10/22  [facility]) CM/SW Contact:    Vinie Sill, [place] Phone Number: 06/13/2019, 1:22 PM  Clinical Narrative:                  CSW visit with the patient at bedside. Patient was alert and oriented. CSW explained role and reason for the consult. CSW spoke with patient regarding PT recommendation of SNF placement at time of discharge. Patient declined and states she wants to go home.  Patient states she lives with her friend, Mechele Dawley. Patient states Laverna Peace does not work and can provide the 24hr assistance/supervision. Patient also advised CSW  of a neighbor and friend that can be there if needed to assist her. Patient requested CSW contact her friend, Becky Sax and give her an update. CSW called and informed patient was in the hospital and possible discharge tomorrow. Becky Sax confirmed that she has friends including herself that can be there for the patient. Once patient is medically ready to discharge, Becky Sax will transport the patient home.   CSW asked patient about about alcohol and substance use. Patient denied substance use but states she drinks alcohol. She states she has been drinking since 54 years old. Patient states both of her parent was alcoholics. She states she had stopped drinking for about 1 year but "things happen" that lead her back to drinking. Patient states she has two kids but not in touch with them. Patient expressed grief and loss of family. CSW provided the patient with resources and encouraged the patient to seek mental health counseling and alcohol and substance abuse treatment. Patient was thankful for the information and agreed she needed the help.   Patient was excited about possibly going home. She expressed to [place] appreciation for the staff that took care of her and "saved her life". Patient expressed understanding of CSW role  and discharge process as well as medical condition. No questions/concerns about plan or treatment at this time.  Thurmond Butts, MSW, [facility] Clinical Social Worker [phone]    Expected Discharge Plan: Home/Self Care Barriers to Discharge: Continued Medical Work up   Patient Goals and CMS Choice Patient states their goals for this hospitalization and ongoing recovery are:: going home      Expected Discharge Plan and Services Expected Discharge Plan: Home/Self Care In-house Referral: Clinical Social Work     Living arrangements for the past 2 months: Single Family Home                                      Prior Living Arrangements/Services Living arrangements for the past 2 months: Single Family Home Lives with:: Self, Significant Other Patient language and need for interpreter reviewed:: Yes Do you feel safe going back to the place where you live?: Yes      Need for Family Participation in Patient Care: Yes (Comment) Care giver support system in place?: Yes (comment)   Criminal Activity/Legal Involvement Pertinent to Current Situation/Hospitalization: No - Comment as needed  Activities of Daily Living Home Assistive Devices/Equipment: None ADL Screening (condition at time of admission) Patient's cognitive ability adequate to safely complete daily activities?: Yes Is the patient deaf or have difficulty hearing?: No Does the patient have difficulty seeing, even when wearing glasses/contacts?: No Does the patient have difficulty concentrating,  remembering, or making decisions?: Yes Patient able to express need for assistance with ADLs?: Yes Does the patient have difficulty dressing or bathing?: No Independently performs ADLs?: No Does the patient have difficulty walking or climbing stairs?: Yes Weakness of Legs: Both Weakness of Arms/Hands: None  Permission Sought/Granted Permission sought to share information with : Family Supports Permission granted to  share information with : Yes, Verbal Permission Granted  Share Information with NAME: Elige Radon     Permission granted to share info w Relationship: friend  Permission granted to share info w Contact Information: [phone]  Emotional Assessment Appearance:: Appears stated age Attitude/Demeanor/Rapport: Engaged Affect (typically observed): Accepting, Appropriate, Pleasant Orientation: : Oriented to Self, Oriented to Place, Oriented to  Time, Oriented to Situation Alcohol / Substance Use: Illicit Drugs, Alcohol Use Psych Involvement: No (comment)  Admission diagnosis:  Substance abuse ([place]) [F19.10] Alcohol abuse [F10.10] Confusion [R41.0] Renal insufficiency [N28.9] Altered mental status, unspecified altered mental status type [R41.82] Acute encephalopathy [G93.40] Patient Active Problem List   Diagnosis Date Noted  . Acute encephalopathy 06/06/2019  . Hypertensive urgency 06/06/2019  . CKD (chronic kidney disease), stage III ([place]) 06/06/2019  . Normochromic normocytic anemia 06/06/2019  . Pressure injury of skin 06/06/2019  . Acute metabolic encephalopathy 59/13/6859  . Substance abuse ([place])    PCP:  Patient, No Pcp Per Pharmacy:   [facility] [place], [place] [address] Phone: [phone] Fax: [phone]     Social Determinants of Health (SDOH) Interventions    Readmission Risk Interventions No flowsheet data found.

## 2019-06-14 ENCOUNTER — Telehealth: Payer: Self-pay | Admitting: *Deleted

## 2019-06-14 DIAGNOSIS — F1010 Alcohol abuse, uncomplicated: Secondary | ICD-10-CM | POA: Diagnosis present

## 2019-06-14 LAB — BASIC METABOLIC PANEL
Anion gap: 10 (ref 5–15)
BUN: 33 mg/dL — ABNORMAL HIGH (ref 6–20)
CO2: 20 mmol/L — ABNORMAL LOW (ref 22–32)
Calcium: 8.5 mg/dL — ABNORMAL LOW (ref 8.9–10.3)
Chloride: 105 mmol/L (ref 98–111)
Creatinine, Ser: 1.94 mg/dL — ABNORMAL HIGH (ref 0.44–1.00)
GFR calc Af Amer: 34 mL/min — ABNORMAL LOW (ref >60)
GFR calc non Af Amer: 29 mL/min — ABNORMAL LOW (ref >60)
Glucose, Bld: 105 mg/dL — ABNORMAL HIGH (ref 70–99)
Potassium: 3.8 mmol/L (ref 3.5–5.1)
Sodium: 135 mmol/L (ref 135–145)

## 2019-06-14 LAB — MAGNESIUM: Magnesium: 1.7 mg/dL (ref 1.7–2.4)

## 2019-06-14 MED ORDER — SODIUM CHLORIDE 0.9 % IV BOLUS: 1000.0000 mL | Freq: Once | INTRAVENOUS | Status: DC

## 2019-06-14 MED ORDER — CLONIDINE 0.3 MG/24HR TD PTWK: 0.3000 mg | MEDICATED_PATCH | TRANSDERMAL | 3 refills | Status: DC

## 2019-06-14 MED ORDER — AMLODIPINE BESYLATE 10 MG PO TABS: 10.0000 mg | ORAL_TABLET | Freq: Every day | ORAL | 0 refills | Status: DC

## 2019-06-14 NOTE — Discharge Summary (Signed)
Physician Discharge Summary  Sherry Baker UXN:[id number] DOB: 1966/07/20 DOA: 06/05/2019  PCP: Patient, No Pcp Per  Admit date: 06/05/2019 Discharge date: 06/14/2019  Admitted From: Home Disposition:  Home  Recommendations for Outpatient Follow-up:  1. Follow up with PCP in 1 week 2. Please obtain BMP in one week for monitoring of renal function 3. Started on amlodipine and clonidine for uncontrolled hypertension 4. Follow-up blood pressure for further titration outpatient 5. Continue to encourage cessation from EtOH and cocaine; and continue to encourage patient seek outpatient rehab which she declined services while inpatient  Home Health: Yes, home health PT/RN Equipment/Devices: Rolling walker, will need continued wound care to sacral fissure/wound  Discharge Condition: Stable CODE STATUS: Full code Diet recommendation: Heart healthy  History of present illness:  Sherry Baker a 53 y.o.femalewithknown history of rectal cancer status post colostomy with history of polysubstance abuse admitted last month July 15 through July 18 for altered mental status at the time patient was witnessed to have seizures likely precipitated by polysubstance abuse hypoglycemic episodes and work-up was largely unremarkable with MRI EEG being negative advised follow-up with neurologist was brought to the ER after patient was found to be altered mental status in the woods. Most of the history obtained from ER physician and previous records and has no family was available and at the time of my exam patient is being sedated after being given Ativan.  ED Course:In the ER patient became alert awake and labs were at baseline. EKG shows normal sinus rhythm chest x-ray unremarkable alcohol level is positive COVID test was negative and urine drug screen was positive for cocaine. Once patient became alert awake plan was to discharge patient back and when patient became altered and had a seizure-like episode  which was generalized tonic-clonic and became confused and was given Ativan 2 mg IV. CT of the head was unremarkable on-call neurologist was consulted by the ER physician who advised at this time no neurology work-up is required. Given the patient's ongoing confused state and seizure-like episode patient admitted for observation.  Hospital course:  Acute metabolic encephalopathy: resolved EtOH withdrawal: resolved Patient presenting after being found in the woods altered.  Recent hospitalization late July for similar episodes related to underlying substance abuse.  During that hospitalization she was witnessed to have seizures likely participated by her substance abuse combined with hypoglycemic episodes. Extensive work-up including MRI and EEG were unrevealing.  Case was discussed with on-call neurology, Dr. Malen Gauze this admission who advised no further neurological work-up and no anti-elliptic medications.  Ammonia level normal 35.  Elevated at 100.  UDS positive for cocaine.  Lactic acid 1.0. CT head without acute disease process.  Patient was started on CIWAA protocol with prn ativan, thiamine, folic acid and multivitamin.  Due to her significant withdrawal symptoms, PCCM was consulted on 8/4 for consideration of ICU level of care and Precedex; but no indication for ICU transfer at that time.  Patient's mentation and symptoms slowly improved during hospitalization and currently back to her normal baseline.  Patient was referred to social work for outpatient substance abuse treatment, patient declined assistance at this time.  Discussed with patient extensively that she needs to sensate from all alcohol and cocaine use.  Acute renal failure Creatinine 1.97 on admission.  Etiology likely poor oral intake and substance abuse.  Creatinine at time of discharge 1.94.  Recommend repeat BMP at PCP visit.  Patient encouraged to increase fluid intake following hospitalization.  Polysubstance abuse UDS  positive for cocaine, EtOH level elevated 100 on admission.  Patient needs total cessation from alcohol and cocaine following discharge.  Discussed extensively by multiple providers daily during hospitalization.  Hypertensive urgency Blood pressure elevated to 218/124 admission.  Positive for cocaine on UDS.  Started on amlodipine and clonidine; with improvement.  Avoid beta-blockers in the setting of active cocaine use.  Follow-up with PCP for further titration of antihypertensives.  Anemia of chronic disease Hemoglobin 10.2 with MCV 90.3 on admission.  Stable.  No signs of blood loss.  History of rectal cancer status post colostomy --Wound care/ostomy consult  Pressure ulcer, present on admission Sacral/gluteal cleft fissure noted.  Continue daily wound care.  Hypokalemia / hypomagnesemia Repleted during hospitalization.  Potassium 3.8 and magnesium 1.7 at time of discharge.  Weakness/debility: PT recommends home health PT with rolling walker.  Home health and DME equipment orders placed.  Patient with history of substance abuse, positive for cocaine and elevated EtOH level on admission.  Patient has had multiple hospitalizations in the past for similar events.  Patient is currently at a normal baseline in regards to her mentation and currently declines any assistance with outpatient substance abuse rehabilitation.  Given her previous hospitalizations, medical noncompliance, and continued symptoms abuse; patient poses a high risk bounce back potential; which is especially likely if she resumes her previous habits of substance abuse with cocaine and EtOH.  Discharge Diagnoses:  Active Problems:   CKD (chronic kidney disease), stage III (HCC)   Normochromic normocytic anemia   Pressure injury of skin   Alcohol abuse    Discharge Instructions  Discharge Instructions    Call MD for:  difficulty breathing, headache or visual disturbances   Complete by: As directed    Call MD  for:  extreme fatigue   Complete by: As directed    Call MD for:  persistant dizziness or light-headedness   Complete by: As directed    Call MD for:  persistant nausea and vomiting   Complete by: As directed    Call MD for:  severe uncontrolled pain   Complete by: As directed    Call MD for:  temperature >100.4   Complete by: As directed    Diet - low sodium heart healthy   Complete by: As directed    Diet general   Complete by: As directed    Avoid all alcohol   Increase activity slowly   Complete by: As directed      Allergies as of 06/14/2019      Reactions   Penicillins Hives, Swelling   Did it involve swelling of the face/tongue/throat, SOB, or low BP? Yes Did it involve sudden or severe rash/hives, skin peeling, or any reaction on the inside of your mouth or nose? No Did you need to seek medical attention at a hospital or doctor's office? No When did it last happen?<10 years If all above answers are "NO", may proceed with cephalosporin use.      Medication List    TAKE these medications   amLODipine 10 MG tablet Commonly known as: NORVASC Take 1 tablet (10 mg total) by mouth daily. Start taking on: June 15, 2019   cloNIDine 0.3 mg/24hr patch Commonly known as: CATAPRES - Dosed in mg/24 hr Place 1 patch (0.3 mg total) onto the skin once a week. Start taking on: June 20, 2019            Durable Medical Equipment  (From admission, onward)  Start     Ordered   06/14/19 0711  For home use only DME Walker rolling  Once    Question:  Patient needs a walker to treat with the following condition  Answer:  Gait disturbance   06/14/19 0710         Follow-up Information    Schedule an appointment as soon as possible for a visit  with [facility].   Contact information: [place] [id number] [phone]         Allergies  Allergen Reactions   Penicillins Hives and  Swelling    Did it involve swelling of the face/tongue/throat, SOB, or low BP? Yes Did it involve sudden or severe rash/hives, skin peeling, or any reaction on the inside of your mouth or nose? No Did you need to seek medical attention at a hospital or doctor's office? No When did it last happen?<10 years If all above answers are "NO", may proceed with cephalosporin use.     Consultations:  Neurology - Dr. Malen Gauze  PCCM - Dr. Tamala Julian   Procedures/Studies: Ct Head Wo Contrast  Result Date: 06/05/2019 CLINICAL DATA:  Altered level of consciousness EXAM: CT HEAD WITHOUT CONTRAST TECHNIQUE: Contiguous axial images were obtained from the base of the skull through the vertex without intravenous contrast. COMPARISON:  05/18/2019 FINDINGS: Brain: No acute intracranial abnormality. Specifically, no hemorrhage, hydrocephalus, mass lesion, acute infarction, or significant intracranial injury. Vascular: No hyperdense vessel or unexpected calcification. Skull: No acute calvarial abnormality. Sinuses/Orbits: Visualized paranasal sinuses and mastoids clear. Orbital soft tissues unremarkable. Other: None IMPRESSION: No acute intracranial abnormality. Electronically Signed   By: Rolm Baptise M.D.   On: 06/05/2019 19:02   Dg Chest Port 1 View  Result Date: 06/05/2019 CLINICAL DATA:  Altered mental status. EXAM: PORTABLE CHEST 1 VIEW COMPARISON:  05/18/2019 FINDINGS: Normal heart size. No pleural effusion. Pulmonary vascular congestion is new from previous exam. No airspace opacity. Visualized osseous structures are unremarkable. IMPRESSION: 1. Pulmonary vascular congestion, new from previous exam. Electronically Signed   By: Kerby Moors M.D.   On: 06/05/2019 19:19       Subjective: Patient seen and examined at bedside, no complaints this morning.  Tolerating diet.  She is alert and oriented x4, mentation at baseline.  Ready for discharge home this afternoon.  Denies headache, no fever/chills/night  sweats, no nausea some vomiting/diarrhea, no chest pain, no palpitations, no shortness of breath, no abdominal pain, no weakness, no paresthesias, no issues with bowel/bladder function.  No acute events overnight per nursing staff.   Discharge Exam: Vitals:   06/14/19 0415 06/14/19 0800  BP: (!) 138/107 (!) 153/91  Pulse: 99   Resp: 16 17  Temp: 97.9 F (36.6 C)   SpO2: 100%    Vitals:   06/13/19 0929 06/13/19 1933 06/14/19 0415 06/14/19 0800  BP:  136/83 (!) 138/107 (!) 153/91  Pulse: 96 89 99   Resp: 15 16 16 17   Temp:  98.8 F (37.1 C) 97.9 F (36.6 C)   TempSrc:  Oral Oral   SpO2: 100% 97% 100%   Weight:      Height:        General: Pt is alert, awake, not in acute distress Cardiovascular: RRR, S1/S2 +, no rubs, no gallops Respiratory: CTA bilaterally, no wheezing, no rhonchi Abdominal: Soft, NT, ND, bowel sounds + Extremities: no edema, no cyanosis Integumentary: Sacral fissure/wound as depicted below       The  results of significant diagnostics from this hospitalization (including imaging, microbiology, ancillary and laboratory) are listed below for reference.     Microbiology: Recent Results (from the past 240 hour(s))  SARS Coronavirus 2 [facility] order, Performed in [facility] hospital lab) Nasopharyngeal Urine, Clean Catch     Status: None   Collection Time: 06/05/19  9:01 PM   Specimen: Urine, Clean Catch; Nasopharyngeal  Result Value Ref Range Status   SARS Coronavirus 2 NEGATIVE NEGATIVE Final    Comment: (NOTE) If result is NEGATIVE SARS-CoV-2 target nucleic acids are NOT DETECTED. The SARS-CoV-2 RNA is generally detectable in upper and lower  respiratory specimens during the acute phase of infection. The lowest  concentration of SARS-CoV-2 viral copies this assay can detect is 250  copies / mL. A negative result does not preclude SARS-CoV-2 infection  and should not be used as the sole basis for treatment or other  patient management decisions.   A negative result may occur with  improper specimen collection / handling, submission of specimen other  than nasopharyngeal swab, presence of viral mutation(s) within the  areas targeted by this assay, and inadequate number of viral copies  (<250 copies / mL). A negative result must be combined with clinical  observations, patient history, and epidemiological information. If result is POSITIVE SARS-CoV-2 target nucleic acids are DETECTED. The SARS-CoV-2 RNA is generally detectable in upper and lower  respiratory specimens dur ing the acute phase of infection.  Positive  results are indicative of active infection with SARS-CoV-2.  Clinical  correlation with patient history and other diagnostic information is  necessary to determine patient infection status.  Positive results do  not rule out bacterial infection or co-infection with other viruses. If result is PRESUMPTIVE POSTIVE SARS-CoV-2 nucleic acids MAY BE PRESENT.   A presumptive positive result was obtained on the submitted specimen  and confirmed on repeat testing.  While 2019 novel coronavirus  (SARS-CoV-2) nucleic acids may be present in the submitted sample  additional confirmatory testing may be necessary for epidemiological  and / or clinical management purposes  to differentiate between  SARS-CoV-2 and other Sarbecovirus currently known to infect humans.  If clinically indicated additional testing with an alternate test  methodology [phone]) is advised. The SARS-CoV-2 RNA is generally  detectable in upper and lower respiratory sp ecimens during the acute  phase of infection. The expected result is Negative. Fact Sheet for Patients:  StrictlyIdeas.no Fact Sheet for Healthcare Providers: BankingDealers.co.za This test is not yet approved or cleared by the [place] FDA and has been authorized for detection and/or diagnosis of SARS-CoV-2 by FDA under an Emergency Use  Authorization (EUA).  This EUA will remain in effect (meaning this test can be used) for the duration of the COVID-19 declaration under Section 564(b)(1) of the Act, 21 U.S.C. section 360bbb-3(b)(1), unless the authorization is terminated or revoked sooner. Performed at [facility], [place] [address], [place], [place] [address]   MRSA PCR Screening     Status: Abnormal   Collection Time: 06/06/19  5:47 AM   Specimen: Nasopharyngeal  Result Value Ref Range Status   MRSA by PCR POSITIVE (A) NEGATIVE Final    Comment:        The GeneXpert MRSA Assay (FDA approved for NASAL specimens only), is one component of a comprehensive MRSA colonization surveillance program. It is not intended to diagnose MRSA infection nor to guide or monitor treatment for MRSA infections. CRITICAL RESULT CALLED TO, READ BACK BY AND  VERIFIED WITH: RN K CHARAPLIWY [id number] AT 736 AM BY CM Performed at [facility], [place] [address] [place], [place] [address]      Labs: BNP (last 3 results) Recent Labs    06/05/19 2019  BNP 366.2*   Basic Metabolic Panel: Recent Labs  Lab 06/09/19 0557 06/10/19 0411 06/10/19 1352 06/11/19 0601 06/12/19 0305 06/13/19 0809 06/14/19 0218  NA 135 134*  --  137 136 138 135  K 3.7 3.2* 3.7 4.0 3.6 4.1 3.8  CL 108 107  --  109 105 107 105  CO2 17* 16*  --  16* 17* 19* 20*  GLUCOSE 97 102*  --  84 85 103* 105*  BUN 9 8  --  11 14 28* 33*  CREATININE 1.48* 1.43*  --  1.42* 1.69* 2.22* 1.94*  CALCIUM 9.3 9.0  --  9.4 9.5 9.1 8.5*  MG 1.6* 2.0  --  1.7 1.9 2.0 1.7  PHOS 3.4  --   --   --   --   --   --    Liver Function Tests: No results for input(s): AST, ALT, ALKPHOS, BILITOT, PROT, ALBUMIN in the last 168 hours. No results for input(s): LIPASE, AMYLASE in the last 168 hours. No results for input(s): AMMONIA in the last 168 hours. CBC: No results for input(s): WBC, NEUTROABS, HGB, HCT, MCV, PLT in the last 168 hours. Cardiac Enzymes: No results for  input(s): CKTOTAL, CKMB, CKMBINDEX, TROPONINI in the last 168 hours. BNP: Invalid input(s): POCBNP CBG: Recent Labs  Lab 06/12/19 0610 06/12/19 1108 06/12/19 1538 06/12/19 2219 06/13/19 0604  GLUCAP 79 92 98 79 87   D-Dimer No results for input(s): DDIMER in the last 72 hours. Hgb A1c No results for input(s): HGBA1C in the last 72 hours. Lipid Profile No results for input(s): CHOL, HDL, LDLCALC, TRIG, CHOLHDL, LDLDIRECT in the last 72 hours. Thyroid function studies No results for input(s): TSH, T4TOTAL, T3FREE, THYROIDAB in the last 72 hours.  Invalid input(s): FREET3 Anemia work up No results for input(s): VITAMINB12, FOLATE, FERRITIN, TIBC, IRON, RETICCTPCT in the last 72 hours. Urinalysis    Component Value Date/Time   COLORURINE STRAW (A) 06/05/2019 2101   APPEARANCEUR CLEAR 06/05/2019 2101   LABSPEC 1.010 06/05/2019 2101   PHURINE 6.0 06/05/2019 2101   GLUCOSEU NEGATIVE 06/05/2019 2101   HGBUR SMALL (A) 06/05/2019 2101   BILIRUBINUR NEGATIVE 06/05/2019 2101   [place] NEGATIVE 06/05/2019 2101   PROTEINUR NEGATIVE 06/05/2019 2101   NITRITE NEGATIVE 06/05/2019 2101   LEUKOCYTESUR SMALL (A) 06/05/2019 2101   Sepsis Labs Invalid input(s): PROCALCITONIN,  WBC,  LACTICIDVEN Microbiology Recent Results (from the past 240 hour(s))  SARS Coronavirus 2 [facility] order, Performed in [facility] hospital lab) Nasopharyngeal Urine, Clean Catch     Status: None   Collection Time: 06/05/19  9:01 PM   Specimen: Urine, Clean Catch; Nasopharyngeal  Result Value Ref Range Status   SARS Coronavirus 2 NEGATIVE NEGATIVE Final    Comment: (NOTE) If result is NEGATIVE SARS-CoV-2 target nucleic acids are NOT DETECTED. The SARS-CoV-2 RNA is generally detectable in upper and lower  respiratory specimens during the acute phase of infection. The lowest  concentration of SARS-CoV-2 viral copies this assay can detect is 250  copies / mL. A negative result does not preclude SARS-CoV-2  infection  and should not be used as the sole basis for treatment or other  patient management decisions.  A negative result may occur with  improper specimen  collection / handling, submission of specimen other  than nasopharyngeal swab, presence of viral mutation(s) within the  areas targeted by this assay, and inadequate number of viral copies  (<250 copies / mL). A negative result must be combined with clinical  observations, patient history, and epidemiological information. If result is POSITIVE SARS-CoV-2 target nucleic acids are DETECTED. The SARS-CoV-2 RNA is generally detectable in upper and lower  respiratory specimens dur ing the acute phase of infection.  Positive  results are indicative of active infection with SARS-CoV-2.  Clinical  correlation with patient history and other diagnostic information is  necessary to determine patient infection status.  Positive results do  not rule out bacterial infection or co-infection with other viruses. If result is PRESUMPTIVE POSTIVE SARS-CoV-2 nucleic acids MAY BE PRESENT.   A presumptive positive result was obtained on the submitted specimen  and confirmed on repeat testing.  While 2019 novel coronavirus  (SARS-CoV-2) nucleic acids may be present in the submitted sample  additional confirmatory testing may be necessary for epidemiological  and / or clinical management purposes  to differentiate between  SARS-CoV-2 and other Sarbecovirus currently known to infect humans.  If clinically indicated additional testing with an alternate test  methodology [phone]) is advised. The SARS-CoV-2 RNA is generally  detectable in upper and lower respiratory sp ecimens during the acute  phase of infection. The expected result is Negative. Fact Sheet for Patients:  StrictlyIdeas.no Fact Sheet for Healthcare Providers: BankingDealers.co.za This test is not yet approved or cleared by the [place]  FDA and has been authorized for detection and/or diagnosis of SARS-CoV-2 by FDA under an Emergency Use Authorization (EUA).  This EUA will remain in effect (meaning this test can be used) for the duration of the COVID-19 declaration under Section 564(b)(1) of the Act, 21 U.S.C. section 360bbb-3(b)(1), unless the authorization is terminated or revoked sooner. Performed at [facility], [place] [address] [place], [place] [address]   MRSA PCR Screening     Status: Abnormal   Collection Time: 06/06/19  5:47 AM   Specimen: Nasopharyngeal  Result Value Ref Range Status   MRSA by PCR POSITIVE (A) NEGATIVE Final    Comment:        The GeneXpert MRSA Assay (FDA approved for NASAL specimens only), is one component of a comprehensive MRSA colonization surveillance program. It is not intended to diagnose MRSA infection nor to guide or monitor treatment for MRSA infections. CRITICAL RESULT CALLED TO, READ BACK BY AND VERIFIED WITH: RN K CHARAPLIWY [id number] AT 60 AM BY CM Performed at [facility], [place] [address] [place], [place] [address]      Time coordinating discharge: Over 30 minutes  SIGNED:   Tyan Dy J [place] DO  [facility] 06/14/2019, 10:14 AM

## 2019-06-14 NOTE — Progress Notes (Signed)
Pt laterally transferred to 5W03. All belongings (except for cigarettes and lighter which were confiscated and are at the [place] stations for when pt d/c's) transported w/ pt. Report given to 5W RN.

## 2019-06-14 NOTE — Discharge Instructions (Signed)
Alcohol Use Disorder Alcohol use disorder is when your drinking disrupts your daily life. When you have this condition, you drink too much alcohol and you cannot control your drinking. Alcohol use disorder can cause serious problems with your physical health. It can affect your brain, heart, liver, pancreas, immune system, stomach, and intestines. Alcohol use disorder can increase your risk for certain cancers and cause problems with your mental health, such as depression, anxiety, psychosis, delirium, and dementia. People with this disorder risk hurting themselves and others. What are the causes? This condition is caused by drinking too much alcohol over time. It is not caused by drinking too much alcohol only one or two times. Some people with this condition drink alcohol to cope with or escape from negative life events. Others drink to relieve pain or symptoms of mental illness. What increases the risk? You are more likely to develop this condition if:  You have a family history of alcohol use disorder.  Your culture encourages drinking to the point of intoxication, or makes alcohol easy to get.  You had a mood or conduct disorder in childhood.  You have been a victim of abuse.  You are an adolescent and: ? You have poor grades or difficulties in school. ? Your caregivers do not talk to you about saying no to alcohol, or supervise your activities. ? You are impulsive or you have trouble with self-control. What are the signs or symptoms? Symptoms of this condition include:  Drinkingmore than you want to.  Drinking for longer than you want to.  Trying several times to drink less or to control your drinking.  Spending a lot of time getting alcohol, drinking, or recovering from drinking.  Craving alcohol.  Having problems at work, at school, or at home due to drinking.  Having problems in relationships due to drinking.  Drinking when it is dangerous to drink, such as before  driving a car.  Continuing to drink even though you know you might have a physical or mental problem related to drinking.  Needing more and more alcohol to get the same effect you want from the alcohol (building up tolerance).  Having symptoms of withdrawal when you stop drinking. Symptoms of withdrawal include: ? Fatigue. ? Nightmares. ? Trouble sleeping. ? Depression. ? Anxiety. ? Fever. ? Seizures. ? Severe confusion. ? Feeling or seeing things that are not there (hallucinations). ? Tremors. ? Rapid heart rate. ? Rapid breathing. ? High blood pressure.  Drinking to avoid symptoms of withdrawal. How is this diagnosed? This condition is diagnosed with an assessment. Your health care provider may start the assessment by asking three or four questions about your drinking. Your health care provider may perform a physical exam or do lab tests to see if you have physical problems resulting from alcohol use. She or he may refer you to a mental health professional for evaluation. How is this treated? Some people with alcohol use disorder are able to reduce their alcohol use to low-risk levels. Others need to completely quit drinking alcohol. When necessary, mental health professionals with specialized training in substance use treatment can help. Your health care provider can help you decide how severe your alcohol use disorder is and what type of treatment you need. The following forms of treatment are available:  Detoxification. Detoxification involves quitting drinking and using prescription medicines within the first week to help lessen withdrawal symptoms. This treatment is important for people who have had withdrawal symptoms before and for heavy  drinkers who are likely to have withdrawal symptoms. Alcohol withdrawal can be dangerous, and in severe cases, it can cause death. Detoxification may be provided in a home, community, or primary care setting, or in a hospital or substance use  treatment facility.  Counseling. This treatment is also called talk therapy. It is provided by substance use treatment counselors. A counselor can address the reasons you use alcohol and suggest ways to keep you from drinking again or to prevent problem drinking. The goals of talk therapy are to: ? Find healthy activities and ways for you to cope with stress. ? Identify and avoid the things that trigger your alcohol use. ? Help you learn how to handle cravings.  Medicines.Medicines can help treat alcohol use disorder by: ? Decreasing alcohol cravings. ? Decreasing the positive feeling you have when you drink alcohol. ? Causing an uncomfortable physical reaction when you drink alcohol (aversion therapy).  Support groups. Support groups are led by people who have quit drinking. They provide emotional support, advice, and guidance. These forms of treatment are often combined. Some people with this condition benefit from a combination of treatments provided by specialized substance use treatment centers. Follow these instructions at home:  Take over-the-counter and prescription medicines only as told by your health care provider.  Check with your health care provider before starting any new medicines.  Ask friends and family members not to offer you alcohol.  Avoid situations where alcohol is served, including gatherings where others are drinking alcohol.  Create a plan for what to do when you are tempted to use alcohol.  Find hobbies or activities that you enjoy that do not include alcohol.  Keep all follow-up visits as told by your health care provider. This is important. How is this prevented?  If you drink, limit alcohol intake to no more than 1 drink a day for nonpregnant women and 2 drinks a day for men. One drink equals 12 oz of beer, 5 oz of wine, or 1 oz of hard liquor.  If you have a mental health condition, get treatment and support.  Do not give alcohol to  adolescents.  If you are an adolescent: ? Do not drink alcohol. ? Do not be afraid to say no if someone offers you alcohol. Speak up about why you do not want to drink. You can be a positive role model for your friends and set a good example for those around you by not drinking alcohol. ? If your friends drink, spend time with others who do not drink alcohol. Make new friends who do not use alcohol. ? Find healthy ways to manage stress and emotions, such as meditation or deep breathing, exercise, spending time in nature, listening to music, or talking with a trusted friend or family member. Contact a health care provider if:  You are not able to take your medicines as told.  Your symptoms get worse.  You return to drinking alcohol (relapse) and your symptoms get worse. Get help right away if:  You have thoughts about hurting yourself or others. If you ever feel like you may hurt yourself or others, or have thoughts about taking your own life, get help right away. You can go to your nearest emergency department or call:  Your local emergency services (911 in the [place]  A suicide crisis helpline, such as the [facility] at (548)638-1339. This is open 24 hours a day. Summary  Alcohol use disorder is when your drinking disrupts your  daily life. When you have this condition, you drink too much alcohol and you cannot control your drinking.  Treatment may include detoxification, counseling, medicine, and support groups.  Ask friends and family members not to offer you alcohol. Avoid situations where alcohol is served.  Get help right away if you have thoughts about hurting yourself or others. This information is not intended to replace advice given to you by your health care provider. Make sure you discuss any questions you have with your health care provider. Document Released: 11/27/2004 Document Revised: 10/02/2017 Document Reviewed: 07/17/2016 Elsevier Patient  Education  [place].   Alcohol Abuse and Dependence Information, Adult Alcohol is a widely available drug. People drink alcohol in different amounts. People who drink alcohol very often and in large amounts often have problems during and after drinking. They may develop what is called an alcohol use disorder. There are two main types of alcohol use disorders:  Alcohol abuse. This is when you use alcohol too much or too often. You may use alcohol to make yourself feel happy or to reduce stress. You may have a hard time setting a limit on the amount you drink.  Alcohol dependence. This is when you use alcohol consistently for a period of time, and your body changes as a result. This can make it hard to stop drinking because you may start to feel sick or feel different when you do not use alcohol. These symptoms are known as withdrawal. How can alcohol abuse and dependence affect me? Alcohol abuse and dependence can have a negative effect on your life. Drinking too much can lead to addiction. You may feel like you need alcohol to function normally. You may drink alcohol before work in the morning, during the day, or as soon as you get home from work in the evening. These actions can result in:  Poor work performance.  Job loss.  Financial problems.  Car crashes or criminal charges from driving after drinking alcohol.  Problems in your relationships with friends and family.  Losing the trust and respect of coworkers, friends, and family. Drinking heavily over a long period of time can permanently damage your body and brain, and can cause lifelong health issues, such as:  Damage to your liver or pancreas.  Heart problems, high blood pressure, or stroke.  Certain cancers.  Decreased ability to fight infections.  Brain or nerve damage.  Depression.  Early (premature) death. If you are careless or you crave alcohol, it is easy to drink more than your body can handle  (overdose). Alcohol overdose is a serious situation that requires hospitalization. It may lead to permanent injuries or death. What can increase my risk?  Having a family history of alcohol abuse.  Having depression or other mental health conditions.  Beginning to drink at an early age.  Binge drinking often.  Experiencing trauma, stress, and an unstable home life during childhood.  Spending time with people who drink often. What actions can I take to prevent or manage alcohol abuse and dependence?  Do not drink alcohol if: ? Your health care provider tells you not to drink. ? You are pregnant, may be pregnant, or are planning to become pregnant.  If you drink alcohol: ? Limit how much you use to:  0-1 drink a day for women.  0-2 drinks a day for men. ? Be aware of how much alcohol is in your drink. In the [place] one drink equals one 12 oz bottle of beer (  355 mL), one 5 oz glass of wine (148 mL), or one 1 oz glass of hard liquor (44 mL).  Stop drinking if you have been drinking too much. This can be very hard to do if you are used to abusing alcohol. If you begin to have withdrawal symptoms, talk with your health care provider or a person that you trust. These symptoms may include anxiety, shaky hands, headache, nausea, sweating, or not being able to sleep.  Choose to drink nonalcoholic beverages in social gatherings and places where there may be alcohol. Activity  Spend more time on activities that you enjoy that do not involve alcohol, like hobbies or exercise.  Find healthy ways to cope with stress, such as exercise, meditation, or spending time with people you care about. General information  Talk to your family, coworkers, and friends about supporting you in your efforts to stop drinking. If they drink, ask them not to drink around you. Spend more time with people who do not drink alcohol.  If you think that you have an alcohol dependency problem: ? Tell friends or  family about your concerns. ? Talk with your health care provider or another health professional about where to get help. ? Work with a Transport planner and a Regulatory affairs officer. ? Consider joining a support group for people who struggle with alcohol abuse and dependence. Where to find support   Your health care provider.  SMART Recovery: www.smartrecovery.org Therapy and support groups  Local treatment centers or chemical dependency counselors.  Local AA groups in your community: NicTax.com.pt Where to find more information  Centers for Disease Control and Prevention: http://www.wolf.info/  National Institute on Alcohol Abuse and Alcoholism: http://www.bradshaw.com/  Alcoholics Anonymous (AA): NicTax.com.pt Contact a health care provider if:  You drank more or for longer than you intended on more than one occasion.  You tried to stop drinking or to cut back on how much you drink, but you were not able to.  You often drink to the point of vomiting or passing out.  You want to drink so badly that you cannot think about anything else.  You have problems in your life due to drinking, but you continue to drink.  You keep drinking even though you feel anxious, depressed, or have experienced memory loss.  You have stopped doing the things you used to enjoy in order to drink.  You have to drink more than you used to in order to get the effect you want.  You experience anxiety, sweating, nausea, shakiness, and trouble sleeping when you try to stop drinking. Get help right away if:  You have thoughts about hurting yourself or others.  You have serious withdrawal symptoms, including: ? Confusion. ? Racing heart. ? High blood pressure. ? Fever. If you ever feel like you may hurt yourself or others, or have thoughts about taking your own life, get help right away. You can go to your nearest emergency department or call:  Your local emergency services (911 in the [place]  A suicide crisis  helpline, such as the [facility] at 520-792-3651. This is open 24 hours a day. Summary  Alcohol abuse and dependence can have a negative effect on your life. Drinking too much or too often can lead to addiction.  If you drink alcohol, limit how much you use.  If you are having trouble keeping your drinking under control, find ways to change your behavior. Hobbies, calming activities, exercise, or support groups can help.  If  you feel you need help with changing your drinking habits, talk with your health care provider, a good friend, or a therapist, or go to an [facility] group. This information is not intended to replace advice given to you by your health care provider. Make sure you discuss any questions you have with your health care provider. Document Released: 10/14/2016 Document Revised: 02/08/2019 Document Reviewed: 12/28/2018 Elsevier Patient Education  2020 [place] [facility] clinic.  Information provided above.

## 2019-06-14 NOTE — Telephone Encounter (Signed)
Dr. Benay Spice requesting to move 8/21 visit to 8/12 at 12:30. Attempted to reach patient-no answer and mailbox is full. Left a SMS message with return phone # [phone]

## 2019-06-14 NOTE — Progress Notes (Signed)
Mamie Nick to be D/C'd Home per MD order.  Discussed with the patient and all questions fully answered.  VSS, Skin clean, dry and intact without evidence of skin break down, no evidence of skin tears noted. IV catheter discontinued intact. Site without signs and symptoms of complications. Dressing and pressure applied.  An After Visit Summary was printed and given to the patient. Patient received prescription.  D/c education completed with patient/family including follow up instructions, medication list, d/c activities limitations if indicated, with other d/c instructions as indicated by MD - patient able to verbalize understanding, all questions fully answered.   Patient instructed to return to ED, call 911, or call MD for any changes in condition.   Patient escorted via [place], and D/C home via private auto.  Luci Bank 06/14/2019 11:54 AM

## 2019-06-15 ENCOUNTER — Encounter (HOSPITAL_COMMUNITY): Payer: Self-pay | Admitting: Internal Medicine

## 2019-06-23 DIAGNOSIS — Z933 Colostomy status: Secondary | ICD-10-CM | POA: Diagnosis not present

## 2019-06-24 ENCOUNTER — Telehealth: Payer: Self-pay | Admitting: Oncology

## 2019-06-24 ENCOUNTER — Inpatient Hospital Stay: Payer: Medicaid Other | Attending: Oncology | Admitting: Oncology

## 2019-06-24 ENCOUNTER — Other Ambulatory Visit: Payer: Self-pay

## 2019-06-24 VITALS — BP 172/95 | HR 100 | Temp 98.5°F | Resp 18 | Ht 62.0 in | Wt 113.5 lb

## 2019-06-24 DIAGNOSIS — F1910 Other psychoactive substance abuse, uncomplicated: Secondary | ICD-10-CM | POA: Insufficient documentation

## 2019-06-24 DIAGNOSIS — C20 Malignant neoplasm of rectum: Secondary | ICD-10-CM | POA: Diagnosis not present

## 2019-06-24 DIAGNOSIS — I10 Essential (primary) hypertension: Secondary | ICD-10-CM | POA: Insufficient documentation

## 2019-06-24 DIAGNOSIS — N19 Unspecified kidney failure: Secondary | ICD-10-CM | POA: Insufficient documentation

## 2019-06-24 DIAGNOSIS — Z85048 Personal history of other malignant neoplasm of rectum, rectosigmoid junction, and anus: Secondary | ICD-10-CM | POA: Diagnosis not present

## 2019-06-24 DIAGNOSIS — D509 Iron deficiency anemia, unspecified: Secondary | ICD-10-CM | POA: Insufficient documentation

## 2019-06-24 NOTE — Progress Notes (Signed)
  [name]   Diagnosis: Rectal cancer  INTERVAL HISTORY:   Sherry Baker was last seen at the cancer center in June 2019.  She reports a good appetite.  She continues alcohol, tobacco, and cocaine use.  She was admitted 06/05/2019 with altered mental status.  She had been admitted in July with seizures related to polysubstance abuse and hypoglycemia.  She had alcohol withdrawal. She complains of pain at the rectum.  She is taking Tylenol and naproxen without relief.  She has not followed up with plastic surgery.  Objective:  Vital signs in last 24 hours:  Blood pressure (!) 172/95, pulse 100, temperature 98.5 F (36.9 C), temperature source Oral, resp. rate 18, height '5\' 2"'$  (1.575 m), weight 113 lb 8 oz (51.5 kg), SpO2 100 %.   Limited physical examination secondary to distancing with the COVID pandemic HEENT: Neck without mass Lymphatics: No cervical, supraclavicular, axillary, or inguinal nodes GI: No hepatosplenomegaly, nontender, left lower quadrant colostomy Vascular: No leg edema  Skin: Perineal wound open open to the pelvic floor, no discrete nodule, the wound is also open at the sacrum   Lab Results:  Lab Results  Component Value Date   WBC 2.7 (L) 06/07/2019   HGB 11.1 (L) 06/07/2019   HCT 34.8 (L) 06/07/2019   MCV 89.2 06/07/2019   PLT 177 06/07/2019   NEUTROABS 2.1 06/05/2019    CMP  Lab Results  Component Value Date   NA 135 06/14/2019   K 3.8 06/14/2019   CL 105 06/14/2019   CO2 20 (L) 06/14/2019   GLUCOSE 105 (H) 06/14/2019   BUN 33 (H) 06/14/2019   CREATININE 1.94 (H) 06/14/2019   CALCIUM 8.5 (L) 06/14/2019   PROT 7.4 06/05/2019   ALBUMIN 3.5 06/05/2019   AST 27 06/05/2019   ALT 15 06/05/2019   ALKPHOS 77 06/05/2019   BILITOT 0.6 06/05/2019   GFRNONAA 29 (L) 06/14/2019   GFRAA 34 (L) 06/14/2019    Lab Results  Component Value Date   CEA1 4.86 04/29/2018     Medications: I have reviewed the patient's  current medications.   Assessment/Plan: 1. Rectal cancer-distal rectal mass noted on colonoscopy 01/21/2017 with a biopsy confirming adenocarcinoma  ? CT abdomen/pelvis 01/17/2017 and CT chest 01/19/2017 with no evidence of distant metastatic disease ? MRI 02/18/2017-T3 N0, 6.5cm from the anal verge ? Radiation/Xeloda initiated 02/17/2017; She did not receive radiation 03/04/2017 through 03/11/2017;received radiation 03/12/2017 and 03/13/2017;radiation then resumed 04/03/2017; she continued Xeloda, radiation completed 05/01/2017 ? APR 06/18/2018, ypT3,ypN0tumor. MSI-stable, no loss of mismatch repair protein expression. Negative margins. ? Adjuvant chemotherapy not recommended due to nonhealing perineal wound  2. Iron deficiency anemia secondary to #1  3. Polysubstance abuse  4. History of tongue ulcer 04/21/2017-question mucositis related to Xeloda.  5.Wound Dehiscence-Admission 07/10/2017, perineal wound remains open  6.  Admissions summer 2020 with seizures and altered mental status   Disposition: Sherry Baker is in remission from rectal cancer.  The perineal wound remains open.  She complains of pain at the perineum and hips.  I recommended she follow-up with Dr. Iran Planas for evaluation of the wound and pain. She will follow-up with her primary physician for management of hypertension, renal failure, and polysubstance abuse.  I encouraged her to discontinue alcohol, cocaine, and cigarettes.  She will return for an office visit in 6 months.  Betsy Coder, MD  06/24/2019  11:21 AM

## 2019-06-24 NOTE — Telephone Encounter (Signed)
Gave avs and calendar ° °

## 2019-07-06 ENCOUNTER — Inpatient Hospital Stay: Payer: Medicaid Other | Admitting: Family Medicine

## 2019-07-25 DIAGNOSIS — Z933 Colostomy status: Secondary | ICD-10-CM | POA: Diagnosis not present

## 2019-07-27 ENCOUNTER — Telehealth: Payer: Self-pay | Admitting: *Deleted

## 2019-07-27 ENCOUNTER — Inpatient Hospital Stay: Payer: Medicaid Other | Admitting: Family Medicine

## 2019-07-27 ENCOUNTER — Ambulatory Visit: Payer: Self-pay | Admitting: Diagnostic Neuroimaging

## 2019-07-27 NOTE — Telephone Encounter (Signed)
Patient was no show for new patient appointment today. 

## 2019-07-28 ENCOUNTER — Encounter: Payer: Self-pay | Admitting: Diagnostic Neuroimaging

## 2019-08-05 ENCOUNTER — Telehealth: Payer: Self-pay | Admitting: *Deleted

## 2019-08-05 NOTE — Telephone Encounter (Signed)
Called to report patient needs another NCPA form for her depends sent to [employer]. Provided her friend with our fax number and she will call and request renewal form be faxed to office.

## 2019-08-10 ENCOUNTER — Encounter: Payer: Self-pay | Admitting: *Deleted

## 2019-08-10 NOTE — Progress Notes (Signed)
Faxed signed [place] form for adult medium pull-ups 100/month to [phone] with confirmation fax received. Forwarded copy to be scanned to HIM.

## 2019-08-11 ENCOUNTER — Inpatient Hospital Stay: Payer: Medicaid Other

## 2019-08-24 DIAGNOSIS — R159 Full incontinence of feces: Secondary | ICD-10-CM | POA: Diagnosis not present

## 2019-08-24 DIAGNOSIS — C20 Malignant neoplasm of rectum: Secondary | ICD-10-CM | POA: Diagnosis not present

## 2019-08-24 DIAGNOSIS — Z933 Colostomy status: Secondary | ICD-10-CM | POA: Diagnosis not present

## 2019-09-02 ENCOUNTER — Inpatient Hospital Stay: Payer: Medicaid Other | Admitting: Family Medicine

## 2019-09-14 ENCOUNTER — Ambulatory Visit: Payer: Medicaid Other | Attending: Family Medicine | Admitting: Physician Assistant

## 2019-09-14 ENCOUNTER — Ambulatory Visit (HOSPITAL_BASED_OUTPATIENT_CLINIC_OR_DEPARTMENT_OTHER): Payer: Medicaid Other | Admitting: Pharmacist

## 2019-09-14 ENCOUNTER — Other Ambulatory Visit: Payer: Self-pay

## 2019-09-14 VITALS — BP 189/106 | HR 100 | Temp 99.6°F | Resp 16 | Ht 62.0 in | Wt 114.0 lb

## 2019-09-14 DIAGNOSIS — E559 Vitamin D deficiency, unspecified: Secondary | ICD-10-CM | POA: Diagnosis not present

## 2019-09-14 DIAGNOSIS — M25551 Pain in right hip: Secondary | ICD-10-CM | POA: Diagnosis not present

## 2019-09-14 DIAGNOSIS — M25552 Pain in left hip: Secondary | ICD-10-CM | POA: Diagnosis not present

## 2019-09-14 DIAGNOSIS — I10 Essential (primary) hypertension: Secondary | ICD-10-CM | POA: Diagnosis present

## 2019-09-14 DIAGNOSIS — G8929 Other chronic pain: Secondary | ICD-10-CM | POA: Diagnosis not present

## 2019-09-14 DIAGNOSIS — Z85048 Personal history of other malignant neoplasm of rectum, rectosigmoid junction, and anus: Secondary | ICD-10-CM | POA: Diagnosis not present

## 2019-09-14 DIAGNOSIS — Z23 Encounter for immunization: Secondary | ICD-10-CM

## 2019-09-14 DIAGNOSIS — Z79899 Other long term (current) drug therapy: Secondary | ICD-10-CM | POA: Insufficient documentation

## 2019-09-14 DIAGNOSIS — F419 Anxiety disorder, unspecified: Secondary | ICD-10-CM | POA: Diagnosis not present

## 2019-09-14 DIAGNOSIS — D696 Thrombocytopenia, unspecified: Secondary | ICD-10-CM | POA: Diagnosis not present

## 2019-09-14 DIAGNOSIS — Z8249 Family history of ischemic heart disease and other diseases of the circulatory system: Secondary | ICD-10-CM | POA: Insufficient documentation

## 2019-09-14 DIAGNOSIS — N289 Disorder of kidney and ureter, unspecified: Secondary | ICD-10-CM | POA: Diagnosis not present

## 2019-09-14 DIAGNOSIS — E538 Deficiency of other specified B group vitamins: Secondary | ICD-10-CM | POA: Diagnosis not present

## 2019-09-14 DIAGNOSIS — F1011 Alcohol abuse, in remission: Secondary | ICD-10-CM

## 2019-09-14 DIAGNOSIS — Z880 Allergy status to penicillin: Secondary | ICD-10-CM | POA: Diagnosis not present

## 2019-09-14 MED ORDER — BUSPIRONE HCL 15 MG PO TABS: 15.0000 mg | ORAL_TABLET | Freq: Two times a day (BID) | ORAL | 3 refills | Status: DC

## 2019-09-14 MED ORDER — CLONIDINE HCL 0.1 MG PO TABS: 0.1000 mg | ORAL_TABLET | Freq: Three times a day (TID) | ORAL | 3 refills | Status: DC

## 2019-09-14 MED ORDER — AMLODIPINE BESYLATE 10 MG PO TABS: 10.0000 mg | ORAL_TABLET | Freq: Every day | ORAL | 1 refills | Status: DC

## 2019-09-14 NOTE — Progress Notes (Signed)
Patient presents for vaccination against influenza per orders of Angela. Consent given. Counseling provided. No contraindications exists. Vaccine administered without incident.

## 2019-09-14 NOTE — Patient Instructions (Addendum)
FactoringRate.ca is the website that has information for alcoholics anonymous.  Call 2036828022 for more information on AA.  Influenza Virus Vaccine injection (Fluarix) What is this medicine? INFLUENZA VIRUS VACCINE (in floo EN zuh VAHY ruhs vak SEEN) helps to reduce the risk of getting influenza also known as the flu. This medicine may be used for other purposes; ask your health care provider or pharmacist if you have questions. COMMON BRAND NAME(S): Fluarix, Fluzone What should I tell my health care provider before I take this medicine? They need to know if you have any of these conditions:  bleeding disorder like hemophilia  fever or infection  Guillain-Barre syndrome or other neurological problems  immune system problems  infection with the human immunodeficiency virus (HIV) or AIDS  low blood platelet counts  multiple sclerosis  an unusual or allergic reaction to influenza virus vaccine, eggs, chicken proteins, latex, gentamicin, other medicines, foods, dyes or preservatives  pregnant or trying to get pregnant  breast-feeding How should I use this medicine? This vaccine is for injection into a muscle. It is given by a health care professional. A copy of Vaccine Information Statements will be given before each vaccination. Read this sheet carefully each time. The sheet may change frequently. Talk to your pediatrician regarding the use of this medicine in children. Special care may be needed. Overdosage: If you think you have taken too much of this medicine contact a poison control center or emergency room at once. NOTE: This medicine is only for you. Do not share this medicine with others. What if I miss a dose? This does not apply. What may interact with this medicine?  chemotherapy or radiation therapy  medicines that lower your immune system like etanercept, anakinra, infliximab, and adalimumab  medicines that treat or prevent blood clots like  warfarin  phenytoin  steroid medicines like prednisone or cortisone  theophylline  vaccines This list may not describe all possible interactions. Give your health care provider a list of all the medicines, herbs, non-prescription drugs, or dietary supplements you use. Also tell them if you smoke, drink alcohol, or use illegal drugs. Some items may interact with your medicine. What should I watch for while using this medicine? Report any side effects that do not go away within 3 days to your doctor or health care professional. Call your health care provider if any unusual symptoms occur within 6 weeks of receiving this vaccine. You may still catch the flu, but the illness is not usually as bad. You cannot get the flu from the vaccine. The vaccine will not protect against colds or other illnesses that may cause fever. The vaccine is needed every year. What side effects may I notice from receiving this medicine? Side effects that you should report to your doctor or health care professional as soon as possible:  allergic reactions like skin rash, itching or hives, swelling of the face, lips, or tongue Side effects that usually do not require medical attention (report to your doctor or health care professional if they continue or are bothersome):  fever  headache  muscle aches and pains  pain, tenderness, redness, or swelling at site where injected  weak or tired This list may not describe all possible side effects. Call your doctor for medical advice about side effects. You may report side effects to FDA at 1-800-FDA-1088. Where should I keep my medicine? This vaccine is only given in a clinic, pharmacy, doctor's office, or other health care setting and will not be stored  at home. NOTE: This sheet is a summary. It may not cover all possible information. If you have questions about this medicine, talk to your doctor, pharmacist, or health care provider.  2020 Elsevier/Gold Standard  (2008-05-17 09:30:40)

## 2019-09-14 NOTE — Progress Notes (Signed)
Sherry Baker, is a 53 y.o. female  OMB:[id number]  GTX:[id number]  DOB - [birth year]  Subjective:  Chief Complaint and HPI: Sherry Baker is a 53 y.o. female here today to establish care and for a follow up visit.  She has uncontrolled htn and a h/o rectal CA ~ 3years ago and is now in remission. Has ostomy bag.  Supposed to be on Clonidine patch but it causes her skin to break out so she doesn't tolerate the adhesive.  No HA/CP/dizziness.  No home BP monitor.    Chronic B hip pain and supposed to have hip replacements.    Also h/o B12 deficiency.  Also h/o alcohol abuse and is 3 months without a drink.  Not doing program of recovery  She c/o anxiety due to financial issues and stressful home environment bc roommate recently broke his ankle so now she is caring for both of them.    ED/Hospital notes reviewed.   Social History:  Lives with friend.  Applying for disability Family history:  htn  ROS:   Constitutional:  No f/c, No night sweats, No unexplained weight loss. EENT:  No vision changes, No blurry vision, No hearing changes. No mouth, throat, or ear problems.  Respiratory: No cough, No SOB Cardiac: No CP, no palpitations GI:  No abd pain, No N/V/D. GU: No Urinary s/sx Musculoskeletal: B hip pain Neuro: No headache, no dizziness, no motor weakness.  Skin: No rash Endocrine:  No polydipsia. No polyuria.  Psych: Denies SI/HI  No problems updated.  ALLERGIES: Allergies  Allergen Reactions  . Penicillins Hives and Swelling    Did it involve swelling of the face/tongue/throat, SOB, or low BP? Yes Did it involve sudden or severe rash/hives, skin peeling, or any reaction on the inside of your mouth or nose? No Did you need to seek medical attention at a hospital or doctor's office? No When did it last happen?<10 years If all above answers are "NO", may proceed with cephalosporin use.   Marland Kitchen Penicillins Hives and Swelling    Has patient had a PCN reaction causing immediate  rash, facial/tongue/throat swelling, SOB or lightheadedness with hypotension: Yes Has patient had a PCN reaction causing severe rash involving mucus membranes or skin necrosis: No Has patient had a PCN reaction that required hospitalization No Has patient had a PCN reaction occurring within the last 10 years: Yes If all of the above answers are "NO", then may proceed with Cephalosporin use.     PAST MEDICAL HISTORY: Past Medical History:  Diagnosis Date  . Alcohol abuse   . Allergy    PCNS swelling  . Arthritis   . Bipolar 1 disorder ([place])   . Cancer ([place]) 01/21/2017   rectal cancer  . Cancer ([place])   . Cirrhosis of liver ([place])   . Depression   . Genetic testing 03/24/2017   Ms. Pagliarulo underwent genetic counseling and testing for hereditary cancer syndromes on 02/17/2017. Her results were negative for mutations in all 46 genes analyzed by [facility]'s 46-gene Common Hereditary Cancers Panel. Genes analyzed include: APC, ATM, AXIN2, BARD1, BMPR1A, BRCA1, BRCA2, BRIP1, CDH1, CDKN2A, CHEK2, CTNNA1, DICER1, EPCAM, GREM1, HOXB13, KIT, MEN1, MLH1, MSH2, MSH3, MSH6, MUTYH, NBN,  . Hypertension     MEDICATIONS AT HOME: Prior to Admission medications   Medication Sig Start Date End Date Taking? Authorizing Provider  amLODipine (NORVASC) 10 MG tablet Take 1 tablet (10 mg total) by mouth daily. 09/14/19   Argentina Donovan, PA-C  busPIRone (BUSPAR) 15  MG tablet Take 1 tablet (15 mg total) by mouth 2 (two) times daily. 09/14/19   Argentina Donovan, PA-C  cloNIDine (CATAPRES) 0.1 MG tablet Take 1 tablet (0.1 mg total) by mouth 3 (three) times daily. 09/14/19   Argentina Donovan, PA-C  ziprasidone (GEODON) 40 MG capsule Take 1 capsule (40 mg total) by mouth 2 (two) times daily with a meal. 11/06/11 01/09/12  Daleen Bo, MD     Objective:  EXAM:   Vitals:   09/14/19 1015  BP: (!) 189/106  Pulse: 100  Resp: 16  Temp: 99.6 F (37.6 C)  TempSrc: Oral  SpO2: 98%  Weight: 114 lb (51.7 kg)   Height: '5\' 2"'  (1.575 m)    General appearance : A&OX3. NAD. Non-toxic-appearing HEENT: Atraumatic and Normocephalic.  PERRLA. EOM intact.  TM clear B. Mouth-MMM, post pharynx WNL w/o erythema, No PND. Neck: supple, no JVD. No cervical lymphadenopathy. No thyromegaly Chest/Lungs:  Breathing-non-labored, Good air entry bilaterally, breath sounds normal without rales, rhonchi, or wheezing  CVS: S1 S2 regular, no murmurs, gallops, rubs  Abdomen: Bowel sounds present, Non tender and not distended with no gaurding, rigidity or rebound. Extremities: Bilateral Lower Ext shows no edema, both legs are warm to touch with = pulse throughout Neurology:  CN II-XII grossly intact, Non focal.   Psych:  TP linear. J/I WNL. Normal speech. Appropriate eye contact and affect.  Skin:  No Rash  Data Review No results found for: HGBA1C   Assessment & Plan   1. Thrombocytopenia (HCC) - CBC with Differential  2. Essential hypertension Not controlled-supposed to be on clinidine patch but doesn't tolerate adhesive so not taking. add- cloNIDine (CATAPRES) 0.1 MG tablet; Take 1 tablet (0.1 mg total) by mouth 3 (three) times daily.  Dispense: 90 tablet; Refill: 3 - amLODipine (NORVASC) 10 MG tablet; Take 1 tablet (10 mg total) by mouth daily.  Dispense: 90 tablet; Refill: 1 - CBC with Differential  3. History of alcohol abuse I have counseled the patient at length about substance abuse and addiction.  12 step meetings/recovery recommended.  Local 12 step meeting lists were given and attendance was encouraged.  Patient expresses understanding.  - Comprehensive metabolic panel  4. Abnormal kidney function - CBC with Differential  5. Anxiety - busPIRone (BUSPAR) 15 MG tablet; Take 1 tablet (15 mg total) by mouth 2 (two) times daily.  Dispense: 60 tablet; Refill: 3 - Ambulatory referral to Social Work - Vitamin D, 25-hydroxy  6. Vitamin B 12 deficiency - Vitamin B12  7. Vitamin D deficiency - Vitamin D,  25-hydroxy  Patient have been counseled extensively about nutrition and exercise  Return in about 1 month (around 10/14/2019) for assign PCP; follow up BP/labs.  The patient was given clear instructions to go to ER or return to medical center if symptoms don't improve, worsen or new problems develop. The patient verbalized understanding. The patient was told to call to get lab results if they haven't heard anything in the next week.     Freeman Caldron, PA-C [facility] and [place], [place]   09/14/2019, 10:34 AMPatient ID: Mamie Nick, female   DOB: 02/06/1966, 53 y.o.   MRN: [id number]

## 2019-09-15 LAB — COMPREHENSIVE METABOLIC PANEL
ALT: 26 IU/L (ref 0–32)
AST: 59 IU/L — ABNORMAL HIGH (ref 0–40)
Albumin/Globulin Ratio: 1.2 (ref 1.2–2.2)
Albumin: 4.1 g/dL (ref 3.8–4.9)
Alkaline Phosphatase: 99 IU/L (ref 39–117)
BUN/Creatinine Ratio: 10 (ref 9–23)
BUN: 16 mg/dL (ref 6–24)
Bilirubin Total: <0.2 mg/dL (ref 0.0–1.2)
CO2: 17 mmol/L — ABNORMAL LOW (ref 20–29)
Calcium: 9.1 mg/dL (ref 8.7–10.2)
Chloride: 103 mmol/L (ref 96–106)
Creatinine, Ser: 1.66 mg/dL — ABNORMAL HIGH (ref 0.57–1.00)
GFR calc Af Amer: 41 mL/min/{1.73_m2} — ABNORMAL LOW (ref >59)
GFR calc non Af Amer: 35 mL/min/{1.73_m2} — ABNORMAL LOW (ref >59)
Globulin, Total: 3.5 g/dL (ref 1.5–4.5)
Glucose: 84 mg/dL (ref 65–99)
Potassium: 4.5 mmol/L (ref 3.5–5.2)
Sodium: 137 mmol/L (ref 134–144)
Total Protein: 7.6 g/dL (ref 6.0–8.5)

## 2019-09-15 LAB — CBC WITH DIFFERENTIAL/PLATELET
Basophils Absolute: 0 10*3/uL (ref 0.0–0.2)
Basos: 0 %
EOS (ABSOLUTE): 0.1 10*3/uL (ref 0.0–0.4)
Eos: 2 %
Hematocrit: 38.2 % (ref 34.0–46.6)
Hemoglobin: 11.9 g/dL (ref 11.1–15.9)
Immature Grans (Abs): 0 10*3/uL (ref 0.0–0.1)
Immature Granulocytes: 1 %
Lymphocytes Absolute: 1 10*3/uL (ref 0.7–3.1)
Lymphs: 20 %
MCH: 27.5 pg (ref 26.6–33.0)
MCHC: 31.2 g/dL — ABNORMAL LOW (ref 31.5–35.7)
MCV: 88 fL (ref 79–97)
Monocytes Absolute: 0.7 10*3/uL (ref 0.1–0.9)
Monocytes: 14 %
Neutrophils Absolute: 3 10*3/uL (ref 1.4–7.0)
Neutrophils: 63 %
Platelets: 179 10*3/uL (ref 150–450)
RBC: 4.32 x10E6/uL (ref 3.77–5.28)
RDW: 18.5 % — ABNORMAL HIGH (ref 11.7–15.4)
WBC: 4.9 10*3/uL (ref 3.4–10.8)

## 2019-09-15 LAB — VITAMIN B12: Vitamin B-12: 259 pg/mL (ref 232–1245)

## 2019-09-15 LAB — VITAMIN D 25 HYDROXY (VIT D DEFICIENCY, FRACTURES): Vit D, 25-Hydroxy: 11.5 ng/mL — ABNORMAL LOW (ref 30.0–100.0)

## 2019-09-22 ENCOUNTER — Institutional Professional Consult (permissible substitution): Payer: Medicaid Other | Admitting: Licensed Clinical Social Worker

## 2019-09-23 DIAGNOSIS — Z933 Colostomy status: Secondary | ICD-10-CM | POA: Diagnosis not present

## 2019-10-14 ENCOUNTER — Ambulatory Visit: Payer: Medicaid Other | Admitting: Family Medicine

## 2019-10-24 DIAGNOSIS — R159 Full incontinence of feces: Secondary | ICD-10-CM | POA: Diagnosis not present

## 2019-10-24 DIAGNOSIS — Z933 Colostomy status: Secondary | ICD-10-CM | POA: Diagnosis not present

## 2019-10-24 DIAGNOSIS — C20 Malignant neoplasm of rectum: Secondary | ICD-10-CM | POA: Diagnosis not present

## 2019-11-11 ENCOUNTER — Other Ambulatory Visit: Payer: Self-pay

## 2019-11-11 ENCOUNTER — Ambulatory Visit: Payer: Medicaid Other | Admitting: Family Medicine

## 2019-11-23 DIAGNOSIS — C20 Malignant neoplasm of rectum: Secondary | ICD-10-CM | POA: Diagnosis not present

## 2019-11-23 DIAGNOSIS — Z933 Colostomy status: Secondary | ICD-10-CM | POA: Diagnosis not present

## 2019-11-23 DIAGNOSIS — R159 Full incontinence of feces: Secondary | ICD-10-CM | POA: Diagnosis not present

## 2019-11-24 ENCOUNTER — Ambulatory Visit: Payer: Medicaid Other | Admitting: Family Medicine

## 2019-12-07 ENCOUNTER — Encounter: Payer: Self-pay | Admitting: Family Medicine

## 2019-12-07 ENCOUNTER — Ambulatory Visit: Payer: Medicaid Other | Attending: Family Medicine | Admitting: Family Medicine

## 2019-12-07 ENCOUNTER — Other Ambulatory Visit: Payer: Self-pay

## 2019-12-07 DIAGNOSIS — N1831 Chronic kidney disease, stage 3a: Secondary | ICD-10-CM | POA: Diagnosis not present

## 2019-12-07 DIAGNOSIS — I10 Essential (primary) hypertension: Secondary | ICD-10-CM

## 2019-12-07 DIAGNOSIS — F419 Anxiety disorder, unspecified: Secondary | ICD-10-CM

## 2019-12-07 DIAGNOSIS — F411 Generalized anxiety disorder: Secondary | ICD-10-CM | POA: Diagnosis not present

## 2019-12-07 DIAGNOSIS — F430 Acute stress reaction: Secondary | ICD-10-CM

## 2019-12-07 MED ORDER — CLONIDINE HCL 0.1 MG PO TABS: 0.1000 mg | ORAL_TABLET | Freq: Three times a day (TID) | ORAL | 3 refills | Status: DC

## 2019-12-07 NOTE — Progress Notes (Signed)
Virtual Visit via Telephone Note  I connected with Sherry Baker on 12/07/19 at 5:25 pm by telephone and verified that I am speaking with the correct person using two identifiers.   I discussed the limitations, risks, security and privacy concerns of performing an evaluation and management service by telephone and the availability of in person appointments. I also discussed with the patient that there may be a patient responsible charge related to this service. The patient expressed understanding and agreed to proceed.  Patient Location: Home Provider Location: [facility] Others participating in call: none   History of Present Illness:       54 yo female, new to me as a patient, who is status post appointment on 09/14/2019 with another provider to establish care status post hospitalization on 06/05/2019-06/14/2019 for altered mental status after being found in the woods. In the ED became more alert and awake. UDS was positive for cocaine and patient with elevated Etoh level. Patient was to be discharged to home but had onset of clonic-tonic type seizure while in the ED and again became confused and was given Ativan 2 mg IV. CT of the head was unremarkable. Due to her ongoing confused state and seizure-like episode, she was admitted for observation. Patient had Neurology work-up in July during a hospitalization for a similar episode of confusion and seizure-like activity therefore Neurology did not feel that she required any additional additional work-up as her seizure-like episodes were thought to be withdrawal related. Cr of 1.97 on admission and 1.94 on discharge. She additionally had hypertensive urgency with a blood pressure of 218/124 for which she was started on amlodipine and clonidine. She additionally had anemia of chronic disease with Hgb of 10.2, pressure ulcer/fissure of the sacral gluteal cleft as well as hypokalemia and hypomagnesemia. She additionally has a history of rectal cancer s/p  colostomy.         At today's visit she reports that she needs a refill of her clonidine. She continues to take both the clonidine as well as the amlodipine. She thinks that her blood pressure has not been well controlled because she has been very stressed out. She has had a recent death of a family member as well as receiving notification that she has been required to move. She feels very overwhelmed at this time. She apologizes for not following up sooner and for not having blood work done. She denies suicidal thoughts or ideations. No chest pain or palpitations. She does have fatigue. She thinks that her current dose of buspar is adequate but that so many other things have happened that she is now overwhelmed. She does not have a current psychiatrist or psychologist but would be willing to speak with the social worker.   Past Medical History:  Diagnosis Date  . Alcohol abuse   . Allergy    PCNS swelling  . Arthritis   . Bipolar 1 disorder ([place])   . Cancer ([place]) 01/21/2017   rectal cancer  . Cancer ([place])   . Cirrhosis of liver ([place])   . Depression   . Genetic testing 03/24/2017   Ms. Gordon underwent genetic counseling and testing for hereditary cancer syndromes on 02/17/2017. Her results were negative for mutations in all 46 genes analyzed by [facility]'s 46-gene Common Hereditary Cancers Panel. Genes analyzed include: APC, ATM, AXIN2, BARD1, BMPR1A, BRCA1, BRCA2, BRIP1, CDH1, CDKN2A, CHEK2, CTNNA1, DICER1, EPCAM, GREM1, HOXB13, KIT, MEN1, MLH1, MSH2, MSH3, MSH6, MUTYH, NBN,  . Hypertension     Past  Surgical History:  Procedure Laterality Date  . ABDOMINAL PERINEAL BOWEL RESECTION N/A 06/18/2017   Procedure: ABDOMINAL PERINEAL RESECTION ERAS PATHWAY;  Surgeon: Leighton Ruff, MD;  Location: [facility];  Service: General;  Laterality: N/A;  . COLON SURGERY    . COLONOSCOPY WITH PROPOFOL Left 01/21/2017   Procedure: COLONOSCOPY WITH PROPOFOL;  Surgeon: Otis Brace, MD;  Location: [place];   Service: Gastroenterology;  Laterality: Left;  . FRACTURE SURGERY    . MANDIBLE FRACTURE SURGERY      Family History  Problem Relation Age of Onset  . Cancer Mother 23       Originating in the abdomen, otherwise unknwon  . Cancer - Other Maternal Aunt        Throat    Social History   Tobacco Use  . Smoking status: Current Every Day Smoker    Packs/day: 0.50    Years: 39.00    Pack years: 19.50    Types: Cigarettes  . Smokeless tobacco: Never Used  Substance Use Topics  . Alcohol use: Yes    Comment: 07/28/2017 "40oz beer/day; if I have it"  . Drug use: Yes    Types: "Crack" cocaine    Comment: 07/28/2017 "nothing in 3 wks"     Allergies  Allergen Reactions  . Penicillins Hives and Swelling    Did it involve swelling of the face/tongue/throat, SOB, or low BP? Yes Did it involve sudden or severe rash/hives, skin peeling, or any reaction on the inside of your mouth or nose? No Did you need to seek medical attention at a hospital or doctor's office? No When did it last happen?<10 years If all above answers are "NO", may proceed with cephalosporin use.   Marland Kitchen Penicillins Hives and Swelling    Has patient had a PCN reaction causing immediate rash, facial/tongue/throat swelling, SOB or lightheadedness with hypotension: Yes Has patient had a PCN reaction causing severe rash involving mucus membranes or skin necrosis: No Has patient had a PCN reaction that required hospitalization No Has patient had a PCN reaction occurring within the last 10 years: Yes If all of the above answers are "NO", then may proceed with Cephalosporin use.        Observations/Objective: No vital signs or physical exam conducted as visit was done via telephone  Assessment and Plan: 1. Essential hypertension She reports that her blood pressure has not been as well controlled due to her current increased stress/anxiety. She is to continue the use of amlodipine and refill provided for clonidine. She has  been asked to make a follow-up appointment in about 4 weeks in follow-up of her HTN and other chronic medical conditions.  - cloNIDine (CATAPRES) 0.1 MG tablet; Take 1 tablet (0.1 mg total) by mouth 3 (three) times daily.  Dispense: 90 tablet; Refill: 3  2. Anxiety; 4. Anxiety as acute reaction to exceptional stress She reports that she has had a recent increase in her level of anxiety due to a death in her family and having to move on short notice. She is to continue her use of buspar and she was offered referral to be contacted by social work to help with anxiety/counseling and to see what other resources might be available.  - Ambulatory referral to Social Work  3. Stage 3a chronic kidney disease; 5. Elevated LFT's She has hypertension which has been difficult to control as well as CKD. She has been asked to come in for blood work in follow-up or blood work will be done  at her appointment in 4 weeks. LFT's will be done in follow-up of prior elevated LFT's and patient's use of alcohol. - Comprehensive metabolic panel; Future - CBC; Future   Follow Up Instructions:Return in about 4 weeks (around 01/04/2020) for anxiety/chronic issues.    I discussed the assessment and treatment plan with the patient. The patient was provided an opportunity to ask questions and all were answered. The patient agreed with the plan and demonstrated an understanding of the instructions.   The patient was advised to call back or seek an in-person evaluation if the symptoms worsen or if the condition fails to improve as anticipated.  I provided 12 minutes of non-face-to-face time during this encounter.   Antony Blackbird, MD

## 2019-12-15 ENCOUNTER — Encounter: Payer: Self-pay | Admitting: *Deleted

## 2019-12-15 ENCOUNTER — Telehealth: Payer: Self-pay | Admitting: Licensed Clinical Social Worker

## 2019-12-15 NOTE — Telephone Encounter (Signed)
Call placed to patient regarding [facility] referral. LCSW left message requesting a return call.  

## 2019-12-15 NOTE — Progress Notes (Signed)
Faxed orders and certificate of medical necessity back to [employer] for [facility] [employer]. Fax [phone]

## 2019-12-23 DIAGNOSIS — Z933 Colostomy status: Secondary | ICD-10-CM | POA: Diagnosis not present

## 2019-12-23 DIAGNOSIS — R159 Full incontinence of feces: Secondary | ICD-10-CM | POA: Diagnosis not present

## 2019-12-23 DIAGNOSIS — C20 Malignant neoplasm of rectum: Secondary | ICD-10-CM | POA: Diagnosis not present

## 2019-12-26 ENCOUNTER — Inpatient Hospital Stay: Payer: Medicaid Other | Attending: Nurse Practitioner | Admitting: Nurse Practitioner

## 2019-12-26 ENCOUNTER — Telehealth: Payer: Self-pay | Admitting: Nurse Practitioner

## 2019-12-26 NOTE — Telephone Encounter (Signed)
Called pt to reschedule 2/22 apt per 2/22 sch msg. Pt did not answer and I was unable to leave a voicemail because pt's mailbox was full. Mailed a reminder letter and calendar.

## 2019-12-27 DIAGNOSIS — Z933 Colostomy status: Secondary | ICD-10-CM | POA: Diagnosis not present

## 2019-12-27 DIAGNOSIS — R159 Full incontinence of feces: Secondary | ICD-10-CM | POA: Diagnosis not present

## 2019-12-27 DIAGNOSIS — C20 Malignant neoplasm of rectum: Secondary | ICD-10-CM | POA: Diagnosis not present

## 2020-01-04 ENCOUNTER — Other Ambulatory Visit: Payer: Self-pay

## 2020-01-04 ENCOUNTER — Telehealth: Payer: Self-pay

## 2020-01-04 ENCOUNTER — Ambulatory Visit: Payer: Medicaid Other | Attending: Family Medicine | Admitting: Licensed Clinical Social Worker

## 2020-01-04 DIAGNOSIS — F4323 Adjustment disorder with mixed anxiety and depressed mood: Secondary | ICD-10-CM

## 2020-01-04 NOTE — Telephone Encounter (Signed)
Referral for assistance with disability sent via [place] email to [place]

## 2020-01-06 NOTE — BH Specialist Note (Signed)
Integrated Behavioral Health Initial Visit  MRN: [id number] Name: Sherry Baker  Number of [place] Clinician visits:: 1/6 Session Start time: 9:13am  Session End time: 9:41am Total time: 30  Type of Service: [place] Interpretor:No. Interpretor Name and Language: n/a  SUBJECTIVE: Sherry Baker is a 54 y.o. female accompanied by self Patient was referred by Dr. Chapman Fitch for anxiety due to a recent death and subsequent move. Patient reports the following symptoms/concerns: Patient reports she has been "feeling down" lately due to the death of her close friend 2023-10-27 and financial strain. Duration of problem: three months ; Severity of problem: moderate  OBJECTIVE: Mood: Depressed and Affect: Appropriate Risk of harm to self or others: No plan to harm self or others  LIFE CONTEXT: Family and Social: Patient resides with friend.  School/Work: Patient is unemployed due to medical conditions. Self-Care: Patient receives food stamps. She receives emotional support from her "christian woman". Patient smokes cigarettes. She uses [employer] for transportation and is insured through [place]. Life Changes: Pt's close friend/roommate passed away 2023-10-27. In addition to grieving the loss of her friend, patient has had to move and is currently residing with another friend.   GOALS ADDRESSED: Patient will: 1. Reduce symptoms of: depression and stress 2. Increase knowledge and/or ability of: coping skills and self-management skills  3. Demonstrate ability to: Increase healthy adjustment to current life circumstances and Increase adequate support systems for patient/family  INTERVENTIONS: Interventions utilized: Solution-Focused Strategies, Supportive Counseling, Medication Monitoring and Link to [employer]  Standardized Assessments completed: GAD-7 and PHQ 2&9  ASSESSMENT: Patient currently experiencing bereavement in response to the loss of  her close friend/roommate 2023/10/27, marked by "feeling down" and tearfulness. Patient states she previously took fluoxetine with success to address symptoms of depression many years ago. Patient states she is not interested in additional medication management beyond her buspirone prescription at this time, in part due to financial constraints. Patient reports she takes buspirone in the evenings.  Patient reports financial concern is most pressing worry at this time. Patient states she is not interested in psychotherapy, as she receives emotional support from her "christian lady".    Patient may benefit from ongoing medication management. This MSW Intern encouraged patient to consider discussing reinstatement of fluoxetine with her doctor or psychotherapy if her feelings of depression continue or worsen. This MSW Intern placed a referral to [facility] to assist patient in applying for disability insurance. Healthy coping skills were discussed and encouraged to manage feelings of depression and anxiety in session.   PLAN: 1. Follow up with behavioral health clinician on : Patient encouraged to contact this MSW Intern or Christa See, LCSW if additional assistance is needed.  2. Behavioral recommendations: Utilize coping skills discussed in session, follow up with [facility] regarding disability application assistance.  3. Referral(s): Community Resources:  [facility] 4. "From scale of 1-10, how likely are you to follow plan?":   Berniece Salines MSW Intern 01/06/20 9:15am

## 2020-01-09 ENCOUNTER — Ambulatory Visit: Payer: Medicaid Other | Attending: Family Medicine | Admitting: Family Medicine

## 2020-01-09 ENCOUNTER — Other Ambulatory Visit: Payer: Self-pay

## 2020-01-09 ENCOUNTER — Encounter: Payer: Self-pay | Admitting: Family Medicine

## 2020-01-09 DIAGNOSIS — Z789 Other specified health status: Secondary | ICD-10-CM | POA: Diagnosis not present

## 2020-01-09 DIAGNOSIS — N1831 Chronic kidney disease, stage 3a: Secondary | ICD-10-CM

## 2020-01-09 DIAGNOSIS — I10 Essential (primary) hypertension: Secondary | ICD-10-CM | POA: Diagnosis not present

## 2020-01-09 DIAGNOSIS — F4323 Adjustment disorder with mixed anxiety and depressed mood: Secondary | ICD-10-CM

## 2020-01-09 MED ORDER — AMLODIPINE BESYLATE 10 MG PO TABS: 10.0000 mg | ORAL_TABLET | Freq: Every day | ORAL | 3 refills | Status: DC

## 2020-01-09 MED ORDER — BUSPIRONE HCL 15 MG PO TABS: 15.0000 mg | ORAL_TABLET | Freq: Two times a day (BID) | ORAL | 3 refills | Status: DC

## 2020-01-09 MED ORDER — HYDROXYZINE HCL 10 MG PO TABS: 10.0000 mg | ORAL_TABLET | Freq: Three times a day (TID) | ORAL | 0 refills | Status: DC | PRN

## 2020-01-09 MED ORDER — CLONIDINE HCL 0.1 MG PO TABS: 0.1000 mg | ORAL_TABLET | Freq: Three times a day (TID) | ORAL | 3 refills | Status: DC

## 2020-01-09 MED FILL — cloNIDine HCL 0.1 MG TABS: 0.1 | 30 days supply | Qty: 90 | Fill #0

## 2020-01-09 MED FILL — busPIRone HCL 15 MG TABS: 15 | 30 days supply | Qty: 60 | Fill #0

## 2020-01-09 MED FILL — AMLODIPINE BESYLATE 10 MG T: 10 | 30 days supply | Qty: 30 | Fill #0

## 2020-01-09 MED FILL — hydrOXYzine HCL 10 MG TABS: 10 | 20 days supply | Qty: 60 | Fill #0

## 2020-01-09 NOTE — Progress Notes (Signed)
Patient is very  Tearful. Roommate passed away. Has some stressors and ANXIETY- homeless. Staying with a friend presently and ok with them at the present time.   Taking Buspirone at night. "It's like a downer to me" unable to take during the day. Would like to take something for daytime anxiety.  Has refills but doesn't have money to get it.   Frequent falls. Need of Bil Hip replacement.  Uses walker but not all the time.

## 2020-01-09 NOTE — Progress Notes (Signed)
Virtual Visit via Telephone Note  I connected with Sherry Baker on 01/09/20 at  4:10 PM EST by telephone and verified that I am speaking with the correct person using two identifiers.   I discussed the limitations, risks, security and privacy concerns of performing an evaluation and management service by telephone and the availability of in person appointments. I also discussed with the patient that there may be a patient responsible charge related to this service. The patient expressed understanding and agreed to proceed.  Patient Location: Home Provider Location: [facility] Others participating in call: none   History of Present Illness:         54 yo female who reports that her roommate passed away about 2 weeks ago and now she is having issues with grief, feeling sad. She reports that she now has no where to live and believes that she will have to some move.  She is currently living in the same place where she and her roommate were living.  She reports that with her roommate's passing as well as her housing situation she has had an increase in depression and anxiety.  She denies any suicidal thoughts or ideations but has been tearful and feels more anxious/jittery.  She has been taking the BuSpar but states that the 15 mg dose causes her to feel drowsy so she sometimes does not take the daytime dose or sometimes takes half of the dose in the daytime and half pill at night.  She has talked with the medical social worker here which did make her feel somewhat better.  She would like ongoing counseling.  She reports that she will be out of her blood pressure medicines in a few days and she does not know how she is going to afford to have these refilled.  She also needs refill of her BuSpar and if possible, medication to help with her current increased anxiety.  She denies any current headaches related to her high blood pressure.  She has had no recent chest pain or palpitations and no shortness of  breath or cough.  She had decreased tobacco use but has now increased level of smoking again due to increased stress and anxiety.   Past Medical History:  Diagnosis Date  . Alcohol abuse   . Allergy    PCNS swelling  . Arthritis   . Bipolar 1 disorder (Sherry Baker)   . Cancer ([place]) 01/21/2017   rectal cancer  . Cancer ([place])   . Cirrhosis of liver ([place])   . Depression   . Genetic testing 03/24/2017   Sherry Baker underwent genetic counseling and testing for hereditary cancer syndromes on 02/17/2017. Her results were negative for mutations in all 46 genes analyzed by [facility]'s 46-gene Common Hereditary Cancers Panel. Genes analyzed include: APC, ATM, AXIN2, BARD1, BMPR1A, BRCA1, BRCA2, BRIP1, CDH1, CDKN2A, CHEK2, CTNNA1, DICER1, EPCAM, GREM1, HOXB13, KIT, MEN1, MLH1, MSH2, MSH3, MSH6, MUTYH, NBN,  . Hypertension     Past Surgical History:  Procedure Laterality Date  . ABDOMINAL PERINEAL BOWEL RESECTION N/A 06/18/2017   Procedure: ABDOMINAL PERINEAL RESECTION ERAS PATHWAY;  Surgeon: Leighton Ruff, MD;  Location: [facility];  Service: General;  Laterality: N/A;  . COLON SURGERY    . COLONOSCOPY WITH PROPOFOL Left 01/21/2017   Procedure: COLONOSCOPY WITH PROPOFOL;  Surgeon: Otis Brace, MD;  Location: [facility];  Service: Gastroenterology;  Laterality: Left;  . FRACTURE SURGERY    . MANDIBLE FRACTURE SURGERY      Family History  Problem  Relation Age of Onset  . Cancer Mother 21       Originating in the abdomen, otherwise unknwon  . Cancer - Other Maternal Aunt        Throat    Social History   Tobacco Use  . Smoking status: Current Every Day Smoker    Packs/day: 0.50    Years: 39.00    Pack years: 19.50    Types: Cigarettes  . Smokeless tobacco: Never Used  Substance Use Topics  . Alcohol use: Yes    Comment: 07/28/2017 "40oz beer/day; if I have it"  . Drug use: Yes    Types: "Crack" cocaine    Comment: 07/28/2017 "nothing in 3 wks"     Allergies  Allergen Reactions  .  Penicillins Hives and Swelling    Did it involve swelling of the face/tongue/throat, SOB, or low BP? Yes Did it involve sudden or severe rash/hives, skin peeling, or any reaction on the inside of your mouth or nose? No Did you need to seek medical attention at a hospital or doctor's office? No When did it last happen?<10 years If all above answers are "NO", may proceed with cephalosporin use.   Marland Kitchen Penicillins Hives and Swelling    Has patient had a PCN reaction causing immediate rash, facial/tongue/throat swelling, SOB or lightheadedness with hypotension: Yes Has patient had a PCN reaction causing severe rash involving mucus membranes or skin necrosis: No Has patient had a PCN reaction that required hospitalization No Has patient had a PCN reaction occurring within the last 10 years: Yes If all of the above answers are "NO", then may proceed with Cephalosporin use.        Observations/Objective: No vital signs or physical exam conducted as visit was done via telephone  Assessment and Plan: 1. Essential hypertension Refills provided of patient's clonidine and amlodipine for continued control of her blood pressure.  She will be contacted by RN who is currently seeing if there are resources to help patient with the cost of prescriptions that she will need to pick up this week for continued control of blood pressure and to help with her anxiety and depression. - cloNIDine (CATAPRES) 0.1 MG tablet; Take 1 tablet (0.1 mg total) by mouth 3 (three) times daily.  Dispense: 90 tablet; Refill: 3 - amLODipine (NORVASC) 10 MG tablet; Take 1 tablet (10 mg total) by mouth daily.  Dispense: 30 tablet; Refill: 3  2. Adjustment disorder with mixed anxiety and depressed mood She is to continue the use of BuSpar and as a whole tablet causes her to feel drowsy, she is to take half tablet in the morning and 1 whole tablet at bedtime.  Prescription also sent to her pharmacy for hydroxyzine to take up to 3  times daily as needed for acute anxiety.  Referral is also being placed for continued follow-up with the social worker for counseling and further mental health referral if needed. - busPIRone (BUSPAR) 15 MG tablet; Take 1 tablet (15 mg total) by mouth 2 (two) times daily.  Dispense: 60 tablet; Refill: 3 - hydrOXYzine (ATARAX/VISTARIL) 10 MG tablet; Take 1 tablet (10 mg total) by mouth 3 (three) times daily as needed. As needed for acute anxiety  Dispense: 60 tablet; Refill: 0 - Ambulatory referral to Social Work  3. Stage 3a chronic kidney disease She has been asked to schedule appointment in the next 2 weeks and follow-up of chronic medical issues including her hypertension and chronic kidney disease and she will have BMP  done at her upcoming appointment.  Most recent creatinine was 1.66 with GFR of 35 on 87/56/4332. - Basic metabolic panel; Future  4. Need for follow-up by social worker Referral placed for patient to have continued follow-up with the medical social worker regarding issues with anxiety, depression and unstable housing. - Ambulatory referral to Social Work  Follow Up Instructions:Return in about 2 weeks (around 01/23/2020) for Anxiety/chronic issues-sooner if needed.    I discussed the assessment and treatment plan with the patient. The patient was provided an opportunity to ask questions and all were answered. The patient agreed with the plan and demonstrated an understanding of the instructions.   The patient was advised to call back or seek an in-person evaluation if the symptoms worsen or if the condition fails to improve as anticipated.  I provided 14 minutes of non-face-to-face time during this encounter.   Antony Blackbird, MD

## 2020-01-18 ENCOUNTER — Inpatient Hospital Stay: Payer: Medicaid Other

## 2020-01-18 ENCOUNTER — Encounter: Payer: Self-pay | Admitting: Nurse Practitioner

## 2020-01-18 ENCOUNTER — Inpatient Hospital Stay: Payer: Medicaid Other | Attending: Nurse Practitioner | Admitting: Nurse Practitioner

## 2020-01-18 ENCOUNTER — Other Ambulatory Visit: Payer: Self-pay

## 2020-01-18 VITALS — BP 179/99 | HR 96 | Temp 98.3°F | Resp 20 | Ht 62.0 in | Wt 112.9 lb

## 2020-01-18 DIAGNOSIS — F1910 Other psychoactive substance abuse, uncomplicated: Secondary | ICD-10-CM | POA: Insufficient documentation

## 2020-01-18 DIAGNOSIS — D509 Iron deficiency anemia, unspecified: Secondary | ICD-10-CM | POA: Insufficient documentation

## 2020-01-18 DIAGNOSIS — R109 Unspecified abdominal pain: Secondary | ICD-10-CM | POA: Insufficient documentation

## 2020-01-18 DIAGNOSIS — C20 Malignant neoplasm of rectum: Secondary | ICD-10-CM

## 2020-01-18 DIAGNOSIS — Z79899 Other long term (current) drug therapy: Secondary | ICD-10-CM | POA: Insufficient documentation

## 2020-01-18 DIAGNOSIS — Z923 Personal history of irradiation: Secondary | ICD-10-CM | POA: Diagnosis not present

## 2020-01-18 DIAGNOSIS — Z9221 Personal history of antineoplastic chemotherapy: Secondary | ICD-10-CM | POA: Insufficient documentation

## 2020-01-18 DIAGNOSIS — Z85048 Personal history of other malignant neoplasm of rectum, rectosigmoid junction, and anus: Secondary | ICD-10-CM | POA: Insufficient documentation

## 2020-01-18 NOTE — Progress Notes (Addendum)
  [facility] OFFICE PROGRESS NOTE   Diagnosis: Rectal cancer  INTERVAL HISTORY:   Sherry Baker returns for follow-up.  She reports colostomy is functioning normally.  She periodically notes blood at the stoma.  She describes her appetite as "not good" for the past several months.  She attributes this to her roommate dying.  She has occasional abdominal pain.  She reports persistence of the perineal wound.  She ran out of her blood pressure medication.  She reports she is working with a Education officer, museum to obtain needed medications.  Objective:  Vital signs in last 24 hours:  Blood pressure (!) 179/99, pulse 96, temperature 98.3 F (36.8 C), temperature source Temporal, resp. rate 20, height _0  (1.575 m), weight 112 lb 14.4 oz (51.2 kg), SpO2 98 %.    Lymphatics: No palpable cervical, supraclavicular, axillary or inguinal lymph nodes.  Bony prominence/fullness left supraclavicular region. Resp: Lungs clear bilaterally. Cardio: Regular rate and rhythm. GI: Abdomen soft and nontender.  No hepatomegaly.  Left lower quadrant colostomy. Vascular: No leg edema. Skin: Persistent perineal wound.  No nodularity.   Lab Results:  Lab Results  Component Value Date   WBC 4.9 09/14/2019   HGB 11.9 09/14/2019   HCT 38.2 09/14/2019   MCV 88 09/14/2019   PLT 179 09/14/2019   NEUTROABS 3.0 09/14/2019    Imaging:  No results found.  Medications: I have reviewed the patient's current medications.  Assessment/Plan: 1. Rectal cancer-distal rectal mass noted on colonoscopy 01/21/2017 with a biopsy confirming adenocarcinoma  ? CT abdomen/pelvis 01/17/2017 and CT chest 01/19/2017 with no evidence of distant metastatic disease ? MRI 02/18/2017-T3 N0, 6.5cm from the anal verge ? Radiation/Xeloda initiated 02/17/2017; She did not receive radiation 03/04/2017 through 03/11/2017;received radiation 03/12/2017 and 03/13/2017;radiation then resumed 04/03/2017; she continued Xeloda,  radiation completed 05/01/2017 ? APR 06/18/2018, ypT3,ypN0tumor. MSI-stable, no loss of mismatch repair protein expression. Negative margins. ? Adjuvant chemotherapy not recommended due to nonhealing perineal wound  2. Iron deficiency anemia secondary to #1  3. Polysubstance abuse  4. History of tongue ulcer 04/21/2017-question mucositis related to Xeloda.  5.Wound Dehiscence-Admission 07/10/2017, perineal wound remains open  6.  Admissions summer 2020 with seizures and altered mental status  Disposition: Sherry Baker remains in clinical remission from rectal cancer.  She is approximately 3 years out from initial diagnosis.  We will follow-up on the CEA from today.  We referred her to Dr. Alessandra Bevels for a surveillance colonoscopy.  She will return for a CEA and follow-up visit in 6 months.  Patient seen with Dr. Benay Spice.    Ned Card ANP/GNP-BC   01/18/2020  1:41 PM  This was a shared visit with Ned Card.  Sherry Baker was interviewed and examined.  The firm prominence at the left low medial neck/supraclavicular fossa is likely related to bone asymmetry.  She reports this finding has been present for years.  Julieanne Manson, MD

## 2020-01-19 ENCOUNTER — Telehealth: Payer: Self-pay | Admitting: Nurse Practitioner

## 2020-01-19 ENCOUNTER — Other Ambulatory Visit: Payer: Self-pay | Admitting: Nurse Practitioner

## 2020-01-19 ENCOUNTER — Telehealth: Payer: Self-pay | Admitting: *Deleted

## 2020-01-19 DIAGNOSIS — C20 Malignant neoplasm of rectum: Secondary | ICD-10-CM

## 2020-01-19 LAB — CEA (IN HOUSE-CHCC): CEA (CHCC-In House): 5.72 ng/mL — ABNORMAL HIGH (ref 0.00–5.00)

## 2020-01-19 NOTE — Telephone Encounter (Signed)
Scheduled apt per 3/18 sch msg - pt aware of appt date and time

## 2020-01-19 NOTE — Telephone Encounter (Signed)
-----   Message from Owens Shark, NP sent at 01/19/2020  9:32 AM EDT ----- Please let her know the CEA is mildly elevated.  Please ask her if she is still smoking.  Recommend repeat CEA in 3 months.

## 2020-01-19 NOTE — Telephone Encounter (Signed)
Per Ned Card, NP, called to make pt aware that her CEA is mildly elevated. Asked pt about smoking, she replied, "yes I'm still smoking cigarettes." Advised pt of repeat CEA in 3 months. Pt verbalized understanding.

## 2020-01-23 ENCOUNTER — Telehealth: Payer: Self-pay | Admitting: Oncology

## 2020-01-23 NOTE — Telephone Encounter (Signed)
Scheduled per los. Called and left msg. Mailed printout  °

## 2020-01-25 ENCOUNTER — Telehealth: Payer: Self-pay | Admitting: Licensed Clinical Social Worker

## 2020-01-25 NOTE — Telephone Encounter (Signed)
This MSW Intern placed call to patient regarding patient messages left on [employer], LCSW voicemail. Patient stated she needed her medications refilled but was unable to afford them. Patient states she has been out of her 4 medications for 2 weeks. This MSW Intern checked with [facility] pharmacy and informed patient that her prescriptions are ready for pickup and she will not have to pay for medications at pickup.   Patient stated she is working with Claiborne Billings from [facility] to apply for disability insurance.   No additional concerns noted.    Idamae Lusher MSW Intern

## 2020-01-26 DIAGNOSIS — C20 Malignant neoplasm of rectum: Secondary | ICD-10-CM | POA: Diagnosis not present

## 2020-01-26 DIAGNOSIS — R159 Full incontinence of feces: Secondary | ICD-10-CM | POA: Diagnosis not present

## 2020-01-26 DIAGNOSIS — Z933 Colostomy status: Secondary | ICD-10-CM | POA: Diagnosis not present

## 2020-02-01 ENCOUNTER — Ambulatory Visit: Payer: Medicaid Other | Admitting: Family Medicine

## 2020-02-15 ENCOUNTER — Telehealth: Payer: Self-pay | Admitting: *Deleted

## 2020-02-15 NOTE — Telephone Encounter (Signed)
Called patient to f/u on referral to [facility] for f/u colonoscopy. She reports the office did call her and she was not ready yet to make appointment. Was told they will call her again in 2 weeks. She asked this RN the reason for the referral and she was notified that she needs a f/u colonoscopy to assess for any new cancer. She agrees to call office if she is not called back by May.

## 2020-02-23 ENCOUNTER — Telehealth: Payer: Self-pay

## 2020-02-23 NOTE — Telephone Encounter (Signed)
Message received from [facility] stating that the patient's disability application has been submitted to [facility]

## 2020-02-27 DIAGNOSIS — Z5181 Encounter for therapeutic drug level monitoring: Secondary | ICD-10-CM | POA: Diagnosis not present

## 2020-02-27 DIAGNOSIS — Z933 Colostomy status: Secondary | ICD-10-CM | POA: Diagnosis not present

## 2020-02-27 DIAGNOSIS — C20 Malignant neoplasm of rectum: Secondary | ICD-10-CM | POA: Diagnosis not present

## 2020-02-27 DIAGNOSIS — R159 Full incontinence of feces: Secondary | ICD-10-CM | POA: Diagnosis not present

## 2020-02-28 DIAGNOSIS — Z5181 Encounter for therapeutic drug level monitoring: Secondary | ICD-10-CM | POA: Diagnosis not present

## 2020-03-01 ENCOUNTER — Ambulatory Visit: Payer: Medicaid Other | Admitting: Family Medicine

## 2020-03-01 DIAGNOSIS — Z5181 Encounter for therapeutic drug level monitoring: Secondary | ICD-10-CM | POA: Diagnosis not present

## 2020-03-08 ENCOUNTER — Telehealth: Payer: Self-pay | Admitting: *Deleted

## 2020-03-08 NOTE — Telephone Encounter (Signed)
Called to inquire if she has heard from [place] regarding appointment back with Dr. Alessandra Bevels? She reports no one has called her, but she has been busy moving. Called [facility] and was informed they called her on 01/23/20 and left message. Also on 02/06/20 she told them she will call back. They also mailed a letter.

## 2020-03-12 DIAGNOSIS — Z5181 Encounter for therapeutic drug level monitoring: Secondary | ICD-10-CM | POA: Diagnosis not present

## 2020-03-13 DIAGNOSIS — Z5181 Encounter for therapeutic drug level monitoring: Secondary | ICD-10-CM | POA: Diagnosis not present

## 2020-03-14 DIAGNOSIS — Z5181 Encounter for therapeutic drug level monitoring: Secondary | ICD-10-CM | POA: Diagnosis not present

## 2020-03-19 DIAGNOSIS — Z5181 Encounter for therapeutic drug level monitoring: Secondary | ICD-10-CM | POA: Diagnosis not present

## 2020-03-21 DIAGNOSIS — Z5181 Encounter for therapeutic drug level monitoring: Secondary | ICD-10-CM | POA: Diagnosis not present

## 2020-03-23 ENCOUNTER — Ambulatory Visit: Payer: Medicaid Other | Admitting: Family Medicine

## 2020-03-25 DIAGNOSIS — Z5181 Encounter for therapeutic drug level monitoring: Secondary | ICD-10-CM | POA: Diagnosis not present

## 2020-03-26 DIAGNOSIS — Z5181 Encounter for therapeutic drug level monitoring: Secondary | ICD-10-CM | POA: Diagnosis not present

## 2020-03-27 DIAGNOSIS — Z5181 Encounter for therapeutic drug level monitoring: Secondary | ICD-10-CM | POA: Diagnosis not present

## 2020-03-28 DIAGNOSIS — Z933 Colostomy status: Secondary | ICD-10-CM | POA: Diagnosis not present

## 2020-03-28 DIAGNOSIS — C20 Malignant neoplasm of rectum: Secondary | ICD-10-CM | POA: Diagnosis not present

## 2020-03-28 DIAGNOSIS — R159 Full incontinence of feces: Secondary | ICD-10-CM | POA: Diagnosis not present

## 2020-03-29 DIAGNOSIS — Z5181 Encounter for therapeutic drug level monitoring: Secondary | ICD-10-CM | POA: Diagnosis not present

## 2020-04-01 DIAGNOSIS — Z5181 Encounter for therapeutic drug level monitoring: Secondary | ICD-10-CM | POA: Diagnosis not present

## 2020-04-03 DIAGNOSIS — Z5181 Encounter for therapeutic drug level monitoring: Secondary | ICD-10-CM | POA: Diagnosis not present

## 2020-04-04 ENCOUNTER — Ambulatory Visit: Payer: Medicaid Other

## 2020-04-04 DIAGNOSIS — Z5181 Encounter for therapeutic drug level monitoring: Secondary | ICD-10-CM | POA: Diagnosis not present

## 2020-04-05 DIAGNOSIS — Z5181 Encounter for therapeutic drug level monitoring: Secondary | ICD-10-CM | POA: Diagnosis not present

## 2020-04-07 ENCOUNTER — Emergency Department (HOSPITAL_COMMUNITY): Payer: Medicaid Other

## 2020-04-07 ENCOUNTER — Encounter (HOSPITAL_COMMUNITY): Payer: Self-pay

## 2020-04-07 ENCOUNTER — Emergency Department (HOSPITAL_COMMUNITY)
Admission: EM | Admit: 2020-04-07 | Discharge: 2020-04-08 | Disposition: A | Discharge status: Home / Self Care | Payer: Medicaid Other | Attending: Emergency Medicine | Admitting: Emergency Medicine

## 2020-04-07 DIAGNOSIS — Z85048 Personal history of other malignant neoplasm of rectum, rectosigmoid junction, and anus: Secondary | ICD-10-CM | POA: Diagnosis not present

## 2020-04-07 DIAGNOSIS — N183 Chronic kidney disease, stage 3 unspecified: Secondary | ICD-10-CM | POA: Diagnosis not present

## 2020-04-07 DIAGNOSIS — E161 Other hypoglycemia: Secondary | ICD-10-CM | POA: Diagnosis not present

## 2020-04-07 DIAGNOSIS — R9431 Abnormal electrocardiogram [ECG] [EKG]: Secondary | ICD-10-CM | POA: Diagnosis not present

## 2020-04-07 DIAGNOSIS — F10120 Alcohol abuse with intoxication, uncomplicated: Secondary | ICD-10-CM | POA: Diagnosis not present

## 2020-04-07 DIAGNOSIS — R404 Transient alteration of awareness: Secondary | ICD-10-CM | POA: Diagnosis not present

## 2020-04-07 DIAGNOSIS — F17210 Nicotine dependence, cigarettes, uncomplicated: Secondary | ICD-10-CM | POA: Diagnosis not present

## 2020-04-07 DIAGNOSIS — R4020 Unspecified coma: Secondary | ICD-10-CM | POA: Diagnosis not present

## 2020-04-07 DIAGNOSIS — R4182 Altered mental status, unspecified: Secondary | ICD-10-CM | POA: Diagnosis not present

## 2020-04-07 DIAGNOSIS — R0902 Hypoxemia: Secondary | ICD-10-CM | POA: Diagnosis not present

## 2020-04-07 DIAGNOSIS — R519 Headache, unspecified: Secondary | ICD-10-CM | POA: Diagnosis not present

## 2020-04-07 DIAGNOSIS — R4781 Slurred speech: Secondary | ICD-10-CM | POA: Diagnosis not present

## 2020-04-07 DIAGNOSIS — Y908 Blood alcohol level of 240 mg/100 ml or more: Secondary | ICD-10-CM | POA: Insufficient documentation

## 2020-04-07 DIAGNOSIS — Z5181 Encounter for therapeutic drug level monitoring: Secondary | ICD-10-CM | POA: Diagnosis not present

## 2020-04-07 DIAGNOSIS — I129 Hypertensive chronic kidney disease with stage 1 through stage 4 chronic kidney disease, or unspecified chronic kidney disease: Secondary | ICD-10-CM | POA: Diagnosis not present

## 2020-04-07 DIAGNOSIS — Z79899 Other long term (current) drug therapy: Secondary | ICD-10-CM | POA: Insufficient documentation

## 2020-04-07 DIAGNOSIS — E162 Hypoglycemia, unspecified: Secondary | ICD-10-CM | POA: Diagnosis not present

## 2020-04-07 DIAGNOSIS — F10920 Alcohol use, unspecified with intoxication, uncomplicated: Secondary | ICD-10-CM

## 2020-04-07 LAB — CBC WITH DIFFERENTIAL/PLATELET
Abs Immature Granulocytes: 0.02 10*3/uL (ref 0.00–0.07)
Basophils Absolute: 0 10*3/uL (ref 0.0–0.1)
Basophils Relative: 1 %
Eosinophils Absolute: 0.1 10*3/uL (ref 0.0–0.5)
Eosinophils Relative: 3 %
HCT: 38.3 % (ref 36.0–46.0)
Hemoglobin: 12.5 g/dL (ref 12.0–15.0)
Immature Granulocytes: 1 %
Lymphocytes Relative: 30 %
Lymphs Abs: 1 10*3/uL (ref 0.7–4.0)
MCH: 30.1 pg (ref 26.0–34.0)
MCHC: 32.6 g/dL (ref 30.0–36.0)
MCV: 92.3 fL (ref 80.0–100.0)
Monocytes Absolute: 0.5 10*3/uL (ref 0.1–1.0)
Monocytes Relative: 14 %
Neutro Abs: 1.7 10*3/uL (ref 1.7–7.7)
Neutrophils Relative %: 51 %
Platelets: 129 10*3/uL — ABNORMAL LOW (ref 150–400)
RBC: 4.15 MIL/uL (ref 3.87–5.11)
RDW: 14.9 % (ref 11.5–15.5)
WBC: 3.3 10*3/uL — ABNORMAL LOW (ref 4.0–10.5)
nRBC: 0 % (ref 0.0–0.2)

## 2020-04-07 LAB — RAPID URINE DRUG SCREEN, HOSP PERFORMED
Amphetamines: NOT DETECTED
Barbiturates: NOT DETECTED
Benzodiazepines: NOT DETECTED
Cocaine: POSITIVE — AB
Opiates: NOT DETECTED
Tetrahydrocannabinol: NOT DETECTED

## 2020-04-07 LAB — COMPREHENSIVE METABOLIC PANEL
ALT: 33 U/L (ref 0–44)
AST: 62 U/L — ABNORMAL HIGH (ref 15–41)
Albumin: 3.3 g/dL — ABNORMAL LOW (ref 3.5–5.0)
Alkaline Phosphatase: 70 U/L (ref 38–126)
Anion gap: 13 (ref 5–15)
BUN: 10 mg/dL (ref 6–20)
CO2: 20 mmol/L — ABNORMAL LOW (ref 22–32)
Calcium: 8.5 mg/dL — ABNORMAL LOW (ref 8.9–10.3)
Chloride: 101 mmol/L (ref 98–111)
Creatinine, Ser: 1.15 mg/dL — ABNORMAL HIGH (ref 0.44–1.00)
GFR calc Af Amer: >60 mL/min (ref >60)
GFR calc non Af Amer: 55 mL/min — ABNORMAL LOW (ref >60)
Glucose, Bld: 87 mg/dL (ref 70–99)
Potassium: 3.3 mmol/L — ABNORMAL LOW (ref 3.5–5.1)
Sodium: 134 mmol/L — ABNORMAL LOW (ref 135–145)
Total Bilirubin: 0.5 mg/dL (ref 0.3–1.2)
Total Protein: 7.3 g/dL (ref 6.5–8.1)

## 2020-04-07 LAB — ETHANOL: Alcohol, Ethyl (B): 321 mg/dL (ref <10)

## 2020-04-07 MED ORDER — SODIUM CHLORIDE 0.9 % IV BOLUS
1000.0000 mL | Freq: Once | INTRAVENOUS | Status: AC
  Administered 2020-04-08: 1000 mL via INTRAVENOUS

## 2020-04-07 NOTE — ED Provider Notes (Signed)
[facility] DEPT Provider Note   CSN: [id number] Arrival date & time: 04/07/20  2150     History Chief Complaint  Patient presents with  . Alcohol Intoxication    Sherry Baker is a 54 y.o. female with a past medical history of rectal cancer, alcohol abuse, cirrhosis, hypertension presenting to the ED for alcohol intoxication. Patient reports pain all over her body. Remainder of history is limited as she appears to be intoxicated.  Level 5 caveat applies.  HPI     Past Medical History:  Diagnosis Date  . Alcohol abuse   . Allergy    PCNS swelling  . Arthritis   . Bipolar 1 disorder ([place])   . Cancer ([place]) 01/21/2017   rectal cancer  . Cancer ([place])   . Cirrhosis of liver ([place])   . Depression   . Genetic testing 03/24/2017   Ms. Hyser underwent genetic counseling and testing for hereditary cancer syndromes on 02/17/2017. Her results were negative for mutations in all 46 genes analyzed by [facility]'s 46-gene Common Hereditary Cancers Panel. Genes analyzed include: APC, ATM, AXIN2, BARD1, BMPR1A, BRCA1, BRCA2, BRIP1, CDH1, CDKN2A, CHEK2, CTNNA1, DICER1, EPCAM, GREM1, HOXB13, KIT, MEN1, MLH1, MSH2, MSH3, MSH6, MUTYH, NBN,  . Hypertension     Patient Active Problem List   Diagnosis Date Noted  . Alcohol abuse 06/14/2019  . CKD (chronic kidney disease), stage III 06/06/2019  . Normochromic normocytic anemia 06/06/2019  . Pressure injury of skin 06/06/2019  . Substance abuse ([place])   . Seizure ([place]) 05/21/2019  . Acute encephalopathy 05/18/2019  . Acute lower UTI 05/18/2019  . Polysubstance abuse ([place]) 05/18/2019  . Wound dehiscence 07/10/2017  . Genetic testing 03/24/2017  . Hypertension 02/18/2017  . GERD (gastroesophageal reflux disease) 02/18/2017  . Rectal cancer ([place]) 01/27/2017  . Abdominal pain, epigastric   . Rectal mass   . Fever   . Anemia 01/16/2017  . Abdominal pain   . Constipation   . Alcohol abuse   . Tobacco abuse   .  Cocaine abuse ([place])   . Bipolar affective disorder ([place])   . Protein-calorie malnutrition, severe ([place]) 02/13/2014  . Suicide ideation 02/12/2014  . Suicidal ideation 02/11/2014  . Alcohol intoxication ([place]) 02/11/2014  . Transaminitis 02/11/2014  . Substance abuse ([place]) 02/11/2014  . Depression 02/11/2014  . Thrombocytopenia ([place]) 02/11/2014    Past Surgical History:  Procedure Laterality Date  . ABDOMINAL PERINEAL BOWEL RESECTION N/A 06/18/2017   Procedure: ABDOMINAL PERINEAL RESECTION ERAS PATHWAY;  Surgeon: Leighton Ruff, MD;  Location: [facility];  Service: General;  Laterality: N/A;  . COLON SURGERY    . COLONOSCOPY WITH PROPOFOL Left 01/21/2017   Procedure: COLONOSCOPY WITH PROPOFOL;  Surgeon: Otis Brace, MD;  Location: [facility];  Service: Gastroenterology;  Laterality: Left;  . FRACTURE SURGERY    . MANDIBLE FRACTURE SURGERY       OB History   No obstetric history on file.     Family History  Problem Relation Age of Onset  . Cancer Mother 46       Originating in the abdomen, otherwise unknwon  . Cancer - Other Maternal Aunt        Throat    Social History   Tobacco Use  . Smoking status: Current Every Day Smoker    Packs/day: 0.50    Years: 39.00    Pack years: 19.50    Types: Cigarettes  . Smokeless tobacco: Never Used  Substance Use Topics  . Alcohol  use: Yes    Comment: 07/28/2017 "40oz beer/day; if I have it"  . Drug use: Yes    Types: "Crack" cocaine    Comment: 07/28/2017 "nothing in 3 wks"    Home Medications Prior to Admission medications   Medication Sig Start Date End Date Taking? Authorizing Provider  amLODipine (NORVASC) 10 MG tablet Take 1 tablet (10 mg total) by mouth daily. 01/09/20   Fulp, Cammie, MD  busPIRone (BUSPAR) 15 MG tablet Take 1 tablet (15 mg total) by mouth 2 (two) times daily. 01/09/20   Fulp, Cammie, MD  cloNIDine (CATAPRES) 0.1 MG tablet Take 1 tablet (0.1 mg total) by mouth 3 (three) times daily. 01/09/20   Fulp, Cammie,  MD  hydrOXYzine (ATARAX/VISTARIL) 10 MG tablet Take 1 tablet (10 mg total) by mouth 3 (three) times daily as needed. As needed for acute anxiety 01/09/20   Fulp, Cammie, MD  ziprasidone (GEODON) 40 MG capsule Take 1 capsule (40 mg total) by mouth 2 (two) times daily with a meal. 11/06/11 01/09/12  Daleen Bo, MD    Allergies    Penicillins and Penicillins  Review of Systems   Review of Systems  Unable to perform ROS: Other (intoxicated)  Musculoskeletal: Positive for myalgias.    Physical Exam Updated Vital Signs BP 113/67   Pulse 78   Temp 97.6 F (36.4 C) (Oral)   Resp (!) 23   SpO2 90%   Physical Exam Vitals and nursing note reviewed.  Constitutional:      General: She is not in acute distress.    Appearance: She is well-developed.  HENT:     Head: Normocephalic and atraumatic.     Nose: Nose normal.  Eyes:     General: No scleral icterus.       Right eye: No discharge.        Left eye: No discharge.     Conjunctiva/sclera: Conjunctivae normal.     Pupils: Pupils are equal, round, and reactive to light.  Cardiovascular:     Rate and Rhythm: Normal rate and regular rhythm.     Heart sounds: Normal heart sounds. No murmur. No friction rub. No gallop.   Pulmonary:     Effort: Pulmonary effort is normal. No respiratory distress.     Breath sounds: Normal breath sounds.  Abdominal:     General: Bowel sounds are normal. There is no distension.     Palpations: Abdomen is soft.     Tenderness: There is no abdominal tenderness. There is no guarding.     Comments: Ostomy bag in place.  Musculoskeletal:        General: Normal range of motion.     Cervical back: Normal range of motion and neck supple.  Skin:    General: Skin is warm and dry.     Findings: No rash.  Neurological:     Mental Status: She is alert.     Motor: No weakness or abnormal muscle tone.     Coordination: Coordination normal.     ED Results / Procedures / Treatments   Labs (all labs ordered are  listed, but only abnormal results are displayed) Labs Reviewed  COMPREHENSIVE METABOLIC PANEL - Abnormal; Notable for the following components:      Result Value   Sodium 134 (*)    Potassium 3.3 (*)    CO2 20 (*)    Creatinine, Ser 1.15 (*)    Calcium 8.5 (*)    Albumin 3.3 (*)    AST  62 (*)    GFR calc non Af Amer 55 (*)    All other components within normal limits  CBC WITH DIFFERENTIAL/PLATELET - Abnormal; Notable for the following components:   WBC 3.3 (*)    Platelets 129 (*)    All other components within normal limits  ETHANOL - Abnormal; Notable for the following components:   Alcohol, Ethyl (B) 321 (*)    All other components within normal limits  RAPID URINE DRUG SCREEN, HOSP PERFORMED - Abnormal; Notable for the following components:   Cocaine POSITIVE (*)    All other components within normal limits  AMMONIA    EKG EKG Interpretation  Date/Time:  Saturday April 07 2020 22:57:39 EDT Ventricular Rate:  82 PR Interval:    QRS Duration: 108 QT Interval:  398 QTC Calculation: 465 R Axis:   73 Text Interpretation: Sinus rhythm Probable left atrial enlargement LVH with secondary repolarization abnormality Interpretation limited secondary to artifact Otherwise no significant change Confirmed by Ripley Fraise [phone]) on 04/07/2020 11:10:17 PM   Radiology CT Head Wo Contrast  Result Date: 04/08/2020 CLINICAL DATA:  Altered mental status. EXAM: CT HEAD WITHOUT CONTRAST TECHNIQUE: Contiguous axial images were obtained from the base of the skull through the vertex without intravenous contrast. COMPARISON:  Head CT 05/18/2019, brain MRI 05/19/2019. FINDINGS: Brain: No intracranial hemorrhage, mass effect, or midline shift. No hydrocephalus. The basilar cisterns are patent. Stable degree of periventricular white matter changes most consistent with chronic small vessel ischemia. No evidence of territorial infarct or acute ischemia. No extra-axial or intracranial fluid  collection. Vascular: Atherosclerosis of skullbase vasculature without hyperdense vessel or abnormal calcification. Skull: No fracture or focal lesion. Sinuses/Orbits: Chronic opacification of lower left mastoid air cells. Mucosal thickening with bubbly debris in the right maxillary sinus, new from prior. Orbits are unremarkable. Other: None. IMPRESSION: 1. No acute intracranial abnormality. 2. Stable degree of chronic small vessel ischemia. 3. Right maxillary sinusitis, may be acute. Electronically Signed   By: Keith Rake M.D.   On: 04/08/2020 00:13   DG Chest Portable 1 View  Result Date: 04/07/2020 CLINICAL DATA:  Altered level of consciousness, intoxicated EXAM: PORTABLE CHEST 1 VIEW COMPARISON:  06/05/2019 FINDINGS: The heart size and mediastinal contours are within normal limits. Both lungs are clear. The visualized skeletal structures are unremarkable. IMPRESSION: No active disease. Electronically Signed   By: Randa Ngo M.D.   On: 04/07/2020 23:42    Procedures Procedures (including critical care time)  Medications Ordered in ED Medications  sodium chloride 0.9 % bolus 1,000 mL (0 mLs Intravenous Stopped 04/08/20 0233)    ED Course  I have reviewed the triage vital signs and the nursing notes.  Pertinent labs & imaging results that were available during my care of the patient were reviewed by me and considered in my medical decision making (see chart for details).  Clinical Course as of Apr 08 433  Sat Apr 07, 2020  2320 I have attempted to call patient's emergency contact without success.   [HK]  2322 Alcohol, Ethyl (B)(!!): 321 [HK]    Clinical Course User Index [HK] Delia Heady, PA-C   MDM Rules/Calculators/A&P                      54 year old female with a past medical history of rectal cancer, alcohol abuse, cirrhosis, hypertension presenting to the ED for alcohol intoxication.  Patient reported pain all over her body.  She is difficult to understand secondary to  her alcohol intoxication.  Abdomen is soft, nontender nondistended.  Normal strength and sensation noted on neurological exam.  No facial asymmetry noted.  Work-up here including CMP with creatinine similar to baseline, CBC unremarkable.  Ethanol level is 321.  UDS positive for cocaine.  Ammonia level is normal.  EKG shows sinus rhythm, no changes from prior tracings.  Chest x-ray and CT of the head is negative for acute abnormalities.  Patient was observed here for 7 hours.  She was able to ambulate, tolerate p.o. intake without difficulty.  She reports improvement in her symptoms.  Suspect that her symptoms were due to her acute alcohol intoxication.  We will have her follow-up with PCP and return for worsening symptoms.  All imaging, if done today, including plain films, CT scans, and ultrasounds, independently reviewed by me, and interpretations confirmed via formal radiology reads.  Patient is hemodynamically stable, in NAD, and able to ambulate in the ED. Evaluation does not show pathology that would require ongoing emergent intervention or inpatient treatment. I explained the diagnosis to the patient. Pain has been managed and has no complaints prior to discharge. Patient is comfortable with above plan and is stable for discharge at this time. All questions were answered prior to disposition. Strict return precautions for returning to the ED were discussed. Encouraged follow up with PCP.   An After Visit Summary was printed and given to the patient.   Portions of this note were generated with Lobbyist. Dictation errors may occur despite best attempts at proofreading.  Final Clinical Impression(s) / ED Diagnoses Final diagnoses:  Alcoholic intoxication without complication [facility])    Rx / [facility] Orders ED Discharge Orders    None       Delia Heady, PA-C 04/08/20 [place], Donald, MD 04/08/20 [id number]    Ripley Fraise, MD 04/08/20 [phone]

## 2020-04-07 NOTE — ED Triage Notes (Signed)
BIB EMS from [place] [address] friend called ems due to pt drinking all day and unable to care for pt and unable to get pt up. PT alert and oriented. Pt denies SI/ HI  Hx of rectal cancer and ostomy.   144/98 97% 16 90 Cbg 74

## 2020-04-08 DIAGNOSIS — R519 Headache, unspecified: Secondary | ICD-10-CM | POA: Diagnosis not present

## 2020-04-08 DIAGNOSIS — R4182 Altered mental status, unspecified: Secondary | ICD-10-CM | POA: Diagnosis not present

## 2020-04-08 LAB — AMMONIA: Ammonia: 22 umol/L (ref 9–35)

## 2020-04-08 NOTE — ED Provider Notes (Signed)
Patient seen/examined in the Emergency Department in conjunction with Advanced Practice Provider  Patient presents for altered mental status, suspected ETOH intoxication Exam : sleeping comfortably, no distress.  Pt wakes up and appears intoxicated.  Scattered bruising of various stages throughout her body.  She has a colostomy Plan: imaging negative.  Pt is intoxicated.  Pt otherwise stable at this time    Ripley Fraise, MD 04/08/20 0030

## 2020-04-08 NOTE — Discharge Instructions (Addendum)
Continue taking your home medications. Follow-up with your primary care provider. Return to the ER for chest pain, shortness of breath, leg swelling, blurry vision.

## 2020-04-08 NOTE — ED Notes (Signed)
Pt ambulated to the bathroom without assistance. 

## 2020-04-08 NOTE — ED Notes (Signed)
Pt ate a ham sandwich and drank a coke without vomiting.

## 2020-04-08 NOTE — ED Notes (Addendum)
Pt alert/oreitned and ambulatory at discharge. PT provide with sandwich, colostomy supplies, and number of friend. PT changed colostomy bag independently.

## 2020-04-09 DIAGNOSIS — Z5181 Encounter for therapeutic drug level monitoring: Secondary | ICD-10-CM | POA: Diagnosis not present

## 2020-04-10 DIAGNOSIS — Z5181 Encounter for therapeutic drug level monitoring: Secondary | ICD-10-CM | POA: Diagnosis not present

## 2020-04-11 DIAGNOSIS — Z5181 Encounter for therapeutic drug level monitoring: Secondary | ICD-10-CM | POA: Diagnosis not present

## 2020-04-12 DIAGNOSIS — Z5181 Encounter for therapeutic drug level monitoring: Secondary | ICD-10-CM | POA: Diagnosis not present

## 2020-04-16 DIAGNOSIS — Z5181 Encounter for therapeutic drug level monitoring: Secondary | ICD-10-CM | POA: Diagnosis not present

## 2020-04-17 DIAGNOSIS — Z5181 Encounter for therapeutic drug level monitoring: Secondary | ICD-10-CM | POA: Diagnosis not present

## 2020-04-18 DIAGNOSIS — Z5181 Encounter for therapeutic drug level monitoring: Secondary | ICD-10-CM | POA: Diagnosis not present

## 2020-04-19 DIAGNOSIS — Z5181 Encounter for therapeutic drug level monitoring: Secondary | ICD-10-CM | POA: Diagnosis not present

## 2020-04-19 NOTE — Progress Notes (Signed)
Patient did not show for appointment.   

## 2020-04-20 ENCOUNTER — Inpatient Hospital Stay: Payer: Medicaid Other | Attending: Nurse Practitioner

## 2020-04-23 ENCOUNTER — Ambulatory Visit (HOSPITAL_BASED_OUTPATIENT_CLINIC_OR_DEPARTMENT_OTHER): Payer: Medicaid Other | Admitting: Family

## 2020-04-23 DIAGNOSIS — Z5181 Encounter for therapeutic drug level monitoring: Secondary | ICD-10-CM | POA: Diagnosis not present

## 2020-04-23 DIAGNOSIS — Z5329 Procedure and treatment not carried out because of patient's decision for other reasons: Secondary | ICD-10-CM

## 2020-04-26 DIAGNOSIS — Z5181 Encounter for therapeutic drug level monitoring: Secondary | ICD-10-CM | POA: Diagnosis not present

## 2020-04-27 DIAGNOSIS — R159 Full incontinence of feces: Secondary | ICD-10-CM | POA: Diagnosis not present

## 2020-04-27 DIAGNOSIS — Z933 Colostomy status: Secondary | ICD-10-CM | POA: Diagnosis not present

## 2020-04-27 DIAGNOSIS — C20 Malignant neoplasm of rectum: Secondary | ICD-10-CM | POA: Diagnosis not present

## 2020-04-29 DIAGNOSIS — Z5181 Encounter for therapeutic drug level monitoring: Secondary | ICD-10-CM | POA: Diagnosis not present

## 2020-05-01 DIAGNOSIS — Z5181 Encounter for therapeutic drug level monitoring: Secondary | ICD-10-CM | POA: Diagnosis not present

## 2020-05-02 DIAGNOSIS — Z5181 Encounter for therapeutic drug level monitoring: Secondary | ICD-10-CM | POA: Diagnosis not present

## 2020-05-03 DIAGNOSIS — Z5181 Encounter for therapeutic drug level monitoring: Secondary | ICD-10-CM | POA: Diagnosis not present

## 2020-05-08 DIAGNOSIS — Z5181 Encounter for therapeutic drug level monitoring: Secondary | ICD-10-CM | POA: Diagnosis not present

## 2020-05-09 ENCOUNTER — Telehealth: Payer: Self-pay | Admitting: Licensed Clinical Social Worker

## 2020-05-09 DIAGNOSIS — Z5181 Encounter for therapeutic drug level monitoring: Secondary | ICD-10-CM | POA: Diagnosis not present

## 2020-05-09 NOTE — Telephone Encounter (Signed)
Follow up call placed to patient. Patient shared that she will be running out of colostomy supplies next month and is concerned that PCP will not be able to sign new prescription. Pt was informed of upcoming appointment with Durene Fruits on 07/13 and patient agreed to bring up concerns during appointment. No additional concerns noted.

## 2020-05-14 DIAGNOSIS — Z5181 Encounter for therapeutic drug level monitoring: Secondary | ICD-10-CM | POA: Diagnosis not present

## 2020-05-15 ENCOUNTER — Ambulatory Visit: Payer: Medicaid Other | Admitting: Family

## 2020-05-15 DIAGNOSIS — Z5181 Encounter for therapeutic drug level monitoring: Secondary | ICD-10-CM | POA: Diagnosis not present

## 2020-05-15 NOTE — Progress Notes (Signed)
   Complete physical exam  Patient: Sherry Baker   DOB: 08/23/1997   54 y.o. Female  MRN: [id number]  Subjective:    No chief complaint on file.   Sherry Baker is a 54 y.o. female who presents today for a complete physical exam. She reports consuming a {diet types:17450} diet. {types:19826} She generally feels {DESC; WELL/FAIRLY WELL/POORLY:18703}. She reports sleeping {DESC; WELL/FAIRLY WELL/POORLY:18703}. She {does/does not:200015} have additional problems to discuss today.    Most recent fall risk assessment:    04/30/2022   10:42 AM  Fall Risk   Falls in the past year? 0  Number falls in past yr: 0  Injury with Fall? 0  Risk for fall due to : No Fall Risks  Follow up Falls evaluation completed     Most recent depression screenings:    04/30/2022   10:42 AM 03/21/2021   10:46 AM  PHQ 2/9 Scores  PHQ - 2 Score 0 0  PHQ- 9 Score 5     {VISON DENTAL STD PSA (Optional):27386}  {History (Optional):23778}  Patient Care Team: Jessup, Joy, NP as PCP - General (Nurse Practitioner)   Outpatient Medications Prior to Visit  Medication Sig   fluticasone (FLONASE) 50 MCG/ACT nasal spray Place 2 sprays into both nostrils in the morning and at bedtime. After 7 days, reduce to once daily.   norgestimate-ethinyl estradiol (SPRINTEC 28) 0.25-35 MG-MCG tablet Take 1 tablet by mouth daily.   Nystatin POWD Apply liberally to affected area 2 times per day   spironolactone (ALDACTONE) 100 MG tablet Take 1 tablet (100 mg total) by mouth daily.   No facility-administered medications prior to visit.    ROS        Objective:     There were no vitals taken for this visit. {Vitals History (Optional):23777}  Physical Exam   No results found for any visits on 06/05/22. {Show previous labs (optional):23779}    Assessment & Plan:    Routine Health Maintenance and Physical Exam  Immunization History  Administered Date(s) Administered   DTaP 11/06/1999, 01/02/2000,  03/12/2000, 11/26/2000, 06/11/2004   Hepatitis A 04/07/2008, 04/13/2009   Hepatitis B 08/24/1999, 10/01/1999, 03/12/2000   HiB (PRP-OMP) 11/06/1999, 01/02/2000, 03/12/2000, 11/26/2000   IPV 11/06/1999, 01/02/2000, 08/31/2000, 06/11/2004   Influenza,inj,Quad PF,6+ Mos 07/14/2014   Influenza-Unspecified 10/13/2012   MMR 08/31/2001, 06/11/2004   Meningococcal Polysaccharide 04/12/2012   Pneumococcal Conjugate-13 11/26/2000   Pneumococcal-Unspecified 03/12/2000, 05/26/2000   Tdap 04/12/2012   Varicella 08/31/2000, 04/07/2008    Health Maintenance  Topic Date Due   HIV Screening  Never done   Hepatitis C Screening  Never done   INFLUENZA VACCINE  06/03/2022   PAP-Cervical Cytology Screening  06/05/2022 (Originally 08/22/2020)   PAP SMEAR-Modifier  06/05/2022 (Originally 08/22/2020)   TETANUS/TDAP  06/05/2022 (Originally 04/12/2022)   HPV VACCINES  Discontinued   COVID-19 Vaccine  Discontinued    Discussed health benefits of physical activity, and encouraged her to engage in regular exercise appropriate for her age and condition.  Problem List Items Addressed This Visit   None Visit Diagnoses     Annual physical exam    -  Primary   Cervical cancer screening       Need for Tdap vaccination          No follow-ups on file.     Joy Jessup, NP   

## 2020-05-20 DIAGNOSIS — Z5181 Encounter for therapeutic drug level monitoring: Secondary | ICD-10-CM | POA: Diagnosis not present

## 2020-05-22 DIAGNOSIS — Z5181 Encounter for therapeutic drug level monitoring: Secondary | ICD-10-CM | POA: Diagnosis not present

## 2020-05-26 ENCOUNTER — Emergency Department (HOSPITAL_COMMUNITY): Payer: Medicaid Other

## 2020-05-26 ENCOUNTER — Emergency Department (HOSPITAL_COMMUNITY)
Admission: EM | Admit: 2020-05-26 | Discharge: 2020-05-26 | Disposition: A | Discharge status: Home / Self Care | Payer: Medicaid Other | Attending: Emergency Medicine | Admitting: Emergency Medicine

## 2020-05-26 ENCOUNTER — Other Ambulatory Visit: Payer: Self-pay

## 2020-05-26 ENCOUNTER — Encounter (HOSPITAL_COMMUNITY): Payer: Self-pay | Admitting: Emergency Medicine

## 2020-05-26 DIAGNOSIS — F1410 Cocaine abuse, uncomplicated: Secondary | ICD-10-CM | POA: Insufficient documentation

## 2020-05-26 DIAGNOSIS — Z5321 Procedure and treatment not carried out due to patient leaving prior to being seen by health care provider: Secondary | ICD-10-CM | POA: Insufficient documentation

## 2020-05-26 DIAGNOSIS — R0789 Other chest pain: Secondary | ICD-10-CM | POA: Diagnosis not present

## 2020-05-26 DIAGNOSIS — R110 Nausea: Secondary | ICD-10-CM | POA: Diagnosis not present

## 2020-05-26 DIAGNOSIS — F1020 Alcohol dependence, uncomplicated: Secondary | ICD-10-CM | POA: Diagnosis present

## 2020-05-26 DIAGNOSIS — R079 Chest pain, unspecified: Secondary | ICD-10-CM | POA: Diagnosis not present

## 2020-05-26 LAB — COMPREHENSIVE METABOLIC PANEL
ALT: 57 U/L — ABNORMAL HIGH (ref 0–44)
AST: 102 U/L — ABNORMAL HIGH (ref 15–41)
Albumin: 3.3 g/dL — ABNORMAL LOW (ref 3.5–5.0)
Alkaline Phosphatase: 95 U/L (ref 38–126)
Anion gap: 14 (ref 5–15)
BUN: 10 mg/dL (ref 6–20)
CO2: 19 mmol/L — ABNORMAL LOW (ref 22–32)
Calcium: 8.8 mg/dL — ABNORMAL LOW (ref 8.9–10.3)
Chloride: 98 mmol/L (ref 98–111)
Creatinine, Ser: 1.23 mg/dL — ABNORMAL HIGH (ref 0.44–1.00)
GFR calc Af Amer: 58 mL/min — ABNORMAL LOW (ref >60)
GFR calc non Af Amer: 50 mL/min — ABNORMAL LOW (ref >60)
Glucose, Bld: 83 mg/dL (ref 70–99)
Potassium: 3.4 mmol/L — ABNORMAL LOW (ref 3.5–5.1)
Sodium: 131 mmol/L — ABNORMAL LOW (ref 135–145)
Total Bilirubin: 0.6 mg/dL (ref 0.3–1.2)
Total Protein: 7.6 g/dL (ref 6.5–8.1)

## 2020-05-26 LAB — TROPONIN I (HIGH SENSITIVITY)
Troponin I (High Sensitivity): 14 ng/L (ref <18)
Troponin I (High Sensitivity): 12 ng/L (ref <18)

## 2020-05-26 LAB — CBC
HCT: 37.7 % (ref 36.0–46.0)
Hemoglobin: 12.3 g/dL (ref 12.0–15.0)
MCH: 30.3 pg (ref 26.0–34.0)
MCHC: 32.6 g/dL (ref 30.0–36.0)
MCV: 92.9 fL (ref 80.0–100.0)
Platelets: 127 10*3/uL — ABNORMAL LOW (ref 150–400)
RBC: 4.06 MIL/uL (ref 3.87–5.11)
RDW: 14.5 % (ref 11.5–15.5)
WBC: 3 10*3/uL — ABNORMAL LOW (ref 4.0–10.5)
nRBC: 0 % (ref 0.0–0.2)

## 2020-05-26 LAB — ETHANOL: Alcohol, Ethyl (B): 329 mg/dL (ref <10)

## 2020-05-26 NOTE — ED Notes (Signed)
Per Tonia Ghent, pt no longer in waiting area.

## 2020-05-26 NOTE — ED Triage Notes (Signed)
Patient arrived with EMS from home requesting detox treatment for her alcoholism and crack cocaine abuse , denies SI or HI , patient added intermittent chest pain for 3 weeks ,respirations unlabored with mild nausea .

## 2020-05-27 DIAGNOSIS — Z5181 Encounter for therapeutic drug level monitoring: Secondary | ICD-10-CM | POA: Diagnosis not present

## 2020-05-28 ENCOUNTER — Telehealth: Payer: Self-pay | Admitting: *Deleted

## 2020-05-28 DIAGNOSIS — Z933 Colostomy status: Secondary | ICD-10-CM | POA: Diagnosis not present

## 2020-05-28 DIAGNOSIS — C20 Malignant neoplasm of rectum: Secondary | ICD-10-CM | POA: Diagnosis not present

## 2020-05-28 DIAGNOSIS — R159 Full incontinence of feces: Secondary | ICD-10-CM | POA: Diagnosis not present

## 2020-05-28 NOTE — Telephone Encounter (Signed)
2nd attempt to contact pt to complete transition of care assessment; message states mailbox is full; unable to leave message.  Lenor Coffin, RN, BSN, [place] [facility] [place] [phone]

## 2020-05-28 NOTE — Telephone Encounter (Signed)
Medicaid Managed Care team Transition of Care Assessment outreach attempt #1 made today. Unable to reach patient. HIPPA compliant voice message left requesting a return call. The patient has also been enrolled in an automated discharge follow up call series and will receive two outreach attempts for transition of care assessment. Contact information has been left for the patient and the Medicaid Managed Care team is available to provide assistance to the patient at any time.  ° °Katrice Fount Bahe, RN, BSN, CCRN °Patient Engagement Center °[phone] ° °

## 2020-05-28 NOTE — Progress Notes (Signed)
Patient did not show for appointment.   

## 2020-05-29 ENCOUNTER — Telehealth: Payer: Self-pay | Admitting: Family Medicine

## 2020-05-29 ENCOUNTER — Ambulatory Visit: Payer: Self-pay | Admitting: Family

## 2020-05-29 NOTE — Telephone Encounter (Signed)
Pt has called in and stated up all nite with a tooth ache, very hard to understand,Wanted to come in later in day, no availability, now wants her dr to call about her coloscopy bag.She can be reached at roommates # [phone]

## 2020-05-29 NOTE — Telephone Encounter (Signed)
Patient was called and a message was left for patient to return phone call.

## 2020-05-30 DIAGNOSIS — Z5181 Encounter for therapeutic drug level monitoring: Secondary | ICD-10-CM | POA: Diagnosis not present

## 2020-06-03 DIAGNOSIS — Z5181 Encounter for therapeutic drug level monitoring: Secondary | ICD-10-CM | POA: Diagnosis not present

## 2020-06-05 DIAGNOSIS — Z5181 Encounter for therapeutic drug level monitoring: Secondary | ICD-10-CM | POA: Diagnosis not present

## 2020-07-12 ENCOUNTER — Telehealth: Payer: Self-pay | Admitting: Licensed Clinical Social Worker

## 2020-07-12 NOTE — Telephone Encounter (Signed)
Follow up call placed to patient. LCSW left message requesting a return call.  

## 2020-07-23 ENCOUNTER — Inpatient Hospital Stay: Payer: Medicaid Other | Attending: Nurse Practitioner | Admitting: Oncology

## 2020-07-23 ENCOUNTER — Inpatient Hospital Stay: Payer: Medicaid Other

## 2020-07-24 ENCOUNTER — Telehealth: Payer: Self-pay | Admitting: *Deleted

## 2020-07-24 NOTE — Telephone Encounter (Signed)
NO show for appointment today. Attempted to call, but no answer an voice mail is full.

## 2020-09-03 ENCOUNTER — Ambulatory Visit: Payer: Medicaid Other | Admitting: Family Medicine

## 2020-11-15 ENCOUNTER — Encounter: Payer: Self-pay | Admitting: Physician Assistant

## 2020-11-15 ENCOUNTER — Ambulatory Visit: Payer: Medicaid Other | Attending: Physician Assistant | Admitting: Physician Assistant

## 2020-11-15 ENCOUNTER — Other Ambulatory Visit: Payer: Self-pay

## 2020-11-15 VITALS — BP 167/109 | HR 103 | Temp 98.5°F | Ht 62.0 in | Wt 102.4 lb

## 2020-11-15 DIAGNOSIS — E871 Hypo-osmolality and hyponatremia: Secondary | ICD-10-CM | POA: Diagnosis not present

## 2020-11-15 DIAGNOSIS — Z79899 Other long term (current) drug therapy: Secondary | ICD-10-CM | POA: Insufficient documentation

## 2020-11-15 DIAGNOSIS — I10 Essential (primary) hypertension: Secondary | ICD-10-CM

## 2020-11-15 DIAGNOSIS — R1013 Epigastric pain: Secondary | ICD-10-CM

## 2020-11-15 DIAGNOSIS — F1910 Other psychoactive substance abuse, uncomplicated: Secondary | ICD-10-CM

## 2020-11-15 DIAGNOSIS — F4323 Adjustment disorder with mixed anxiety and depressed mood: Secondary | ICD-10-CM | POA: Diagnosis not present

## 2020-11-15 DIAGNOSIS — E876 Hypokalemia: Secondary | ICD-10-CM | POA: Diagnosis not present

## 2020-11-15 MED ORDER — CLONIDINE HCL 0.1 MG PO TABS: 0.1000 mg | ORAL_TABLET | Freq: Three times a day (TID) | ORAL | 3 refills | Status: DC

## 2020-11-15 MED ORDER — HYDROXYZINE HCL 10 MG PO TABS: 10.0000 mg | ORAL_TABLET | Freq: Three times a day (TID) | ORAL | 0 refills | Status: DC | PRN

## 2020-11-15 MED ORDER — BUSPIRONE HCL 15 MG PO TABS: 15.0000 mg | ORAL_TABLET | Freq: Two times a day (BID) | ORAL | 3 refills | Status: DC

## 2020-11-15 MED ORDER — OMEPRAZOLE 40 MG PO CPDR: 40.0000 mg | DELAYED_RELEASE_CAPSULE | Freq: Every day | ORAL | 3 refills | Status: DC

## 2020-11-15 MED ORDER — AMLODIPINE BESYLATE 10 MG PO TABS: 10.0000 mg | ORAL_TABLET | Freq: Every day | ORAL | 3 refills | Status: DC

## 2020-11-15 MED ORDER — CLONIDINE HCL 0.1 MG PO TABS
0.1000 mg | ORAL_TABLET | Freq: Once | ORAL | Status: AC
  Administered 2020-11-15: 0.1 mg via ORAL

## 2020-11-15 NOTE — Progress Notes (Signed)
Patient ID: Sherry Baker, female   DOB: [birth year], 55 y.o.   MRN: [id number]   Sherry Baker, is a 55 y.o. female  OMB:[id number]  GTX:[id number]  DOB - October 17, 1966  Subjective:  Chief Complaint and HPI: Sherry Baker is a 55 y.o. female here today to establish care and for a follow up visit.   ED/Hospital notes reviewed.   Social History: Family history:  ROS:   Constitutional:  No f/c, No night sweats, No unexplained weight loss. EENT:  No vision changes, No blurry vision, No hearing changes. No mouth, throat, or ear problems.  Respiratory: No cough, No SOB Cardiac: No CP, no palpitations GI:  No abd pain, No N/V/D. GU: No Urinary s/sx Musculoskeletal: No joint pain Neuro: No headache, no dizziness, no motor weakness.  Skin: No rash Endocrine:  No polydipsia. No polyuria.  Psych: Denies SI/HI  No problems updated.  ALLERGIES: Allergies  Allergen Reactions  . Penicillins Hives and Swelling    Did it involve swelling of the face/tongue/throat, SOB, or low BP? Yes Did it involve sudden or severe rash/hives, skin peeling, or any reaction on the inside of your mouth or nose? No Did you need to seek medical attention at a hospital or doctor's office? No When did it last happen?<10 years If all above answers are "NO", may proceed with cephalosporin use.   Marland Kitchen Penicillins Hives and Swelling    Has patient had a PCN reaction causing immediate rash, facial/tongue/throat swelling, SOB or lightheadedness with hypotension: Yes Has patient had a PCN reaction causing severe rash involving mucus membranes or skin necrosis: No Has patient had a PCN reaction that required hospitalization No Has patient had a PCN reaction occurring within the last 10 years: Yes If all of the above answers are "NO", then may proceed with Cephalosporin use.     PAST MEDICAL HISTORY: Past Medical History:  Diagnosis Date  . Alcohol abuse   . Allergy    PCNS swelling  . Arthritis   . Bipolar 1  disorder ([place])   . Cancer ([place]) 01/21/2017   rectal cancer  . Cancer ([place])   . Cirrhosis of liver ([place])   . Depression   . Genetic testing 03/24/2017   [name] underwent genetic counseling and testing for hereditary cancer syndromes on 02/17/2017. Her results were negative for mutations in all 46 genes analyzed by [facility]'s 46-gene Common Hereditary Cancers Panel. Genes analyzed include: APC, ATM, AXIN2, BARD1, BMPR1A, BRCA1, BRCA2, BRIP1, CDH1, CDKN2A, CHEK2, CTNNA1, DICER1, EPCAM, GREM1, HOXB13, KIT, MEN1, MLH1, MSH2, MSH3, MSH6, MUTYH, NBN,  . Hypertension     MEDICATIONS AT HOME: Prior to Admission medications   Medication Sig Start Date End Date Taking? Authorizing Provider  omeprazole (PRILOSEC) 40 MG capsule Take 1 capsule (40 mg total) by mouth daily. 11/15/20  Yes Freeman Caldron M, PA-C  amLODipine (NORVASC) 10 MG tablet Take 1 tablet (10 mg total) by mouth daily. 11/15/20   Argentina Donovan, PA-C  busPIRone (BUSPAR) 15 MG tablet Take 1 tablet (15 mg total) by mouth 2 (two) times daily. 11/15/20   Argentina Donovan, PA-C  cloNIDine (CATAPRES) 0.1 MG tablet Take 1 tablet (0.1 mg total) by mouth 3 (three) times daily. 11/15/20   Argentina Donovan, PA-C  hydrOXYzine (ATARAX/VISTARIL) 10 MG tablet Take 1 tablet (10 mg total) by mouth 3 (three) times daily as needed. As needed for acute anxiety 11/15/20   Argentina Donovan, PA-C  ziprasidone (GEODON) 40 MG capsule Take 1 capsule (  40 mg total) by mouth 2 (two) times daily with a meal. 11/06/11 01/09/12  Daleen Bo, MD     Objective:  EXAM:   Vitals:   11/15/20 1109  BP: (!) 167/109  Pulse: (!) 103  Temp: 98.5 F (36.9 C)  TempSrc: Oral  SpO2: 99%  Weight: 102 lb 6.4 oz (46.4 kg)  Height: '5\' 2"'  (1.575 m)    General appearance : A&OX3. NAD. Non-toxic-appearing HEENT: Atraumatic and Normocephalic.  PERRLA. EOM intact.  TM clear B. Mouth-MMM, post pharynx WNL w/o erythema, No PND. Neck: supple, no JVD. No cervical  lymphadenopathy. No thyromegaly Chest/Lungs:  Breathing-non-labored, Good air entry bilaterally, breath sounds normal without rales, rhonchi, or wheezing  CVS: S1 S2 regular, no murmurs, gallops, rubs  Abdomen: Bowel sounds present, Non tender and not distended with no gaurding, rigidity or rebound. Extremities: Bilateral Lower Ext shows no edema, both legs are warm to touch with = pulse throughout Neurology:  CN II-XII grossly intact, Non focal.   Psych:  TP linear. J/I WNL. Normal speech. Appropriate eye contact and affect.  Skin:  No Rash  Data Review No results found for: HGBA1C   Assessment & Plan   1. Essential hypertension Uncontrolled;  Not taking any meds. Resume meds.  Check BP OOO.  DASH diet and eliminate alcohol - cloNIDine (CATAPRES) tablet 0.1 mg - Comprehensive metabolic panel - CBC with Differential/Platelet - amLODipine (NORVASC) 10 MG tablet; Take 1 tablet (10 mg total) by mouth daily.  Dispense: 30 tablet; Refill: 3 - cloNIDine (CATAPRES) 0.1 MG tablet; Take 1 tablet (0.1 mg total) by mouth 3 (three) times daily.  Dispense: 90 tablet; Refill: 3 - EKG 12-Lead  2. Midepigastric pain EKG w/o ST elevation and compared to 05/2020 EKG - H. pylori breath test - Comprehensive metabolic panel - CBC with Differential/Platelet - omeprazole (PRILOSEC) 40 MG capsule; Take 1 capsule (40 mg total) by mouth daily.  Dispense: 30 capsule; Refill: 3 - EKG 12-Lead  3. Polysubstance abuse ([place]) [email or web address] [email or web address] is the website for narcotics anonymous [email or web address] (website) or 979-524-3379 is the information for alcoholics anonymous Both are free and immediately available for help with alcohol and drug use I have counseled the patient at length about substance abuse and addiction.  12 step meetings/recovery recommended.  Local 12 step meeting lists were given and attendance was encouraged.  Patient expresses understanding.    4. Hyponatremia - Comprehensive metabolic panel  5.  Hypokalemia - Comprehensive metabolic panel  6. Adjustment disorder with mixed anxiety and depressed mood - busPIRone (BUSPAR) 15 MG tablet; Take 1 tablet (15 mg total) by mouth 2 (two) times daily.  Dispense: 60 tablet; Refill: 3 - hydrOXYzine (ATARAX/VISTARIL) 10 MG tablet; Take 1 tablet (10 mg total) by mouth 3 (three) times daily as needed. As needed for acute anxiety  Dispense: 60 tablet; Refill: 0   Patient have been counseled extensively about nutrition and exercise  Return in about 3 months (around 02/13/2021) for 3 weeks with Lurena Joiner for BP and 3 months to be assigned a new PCP.  The patient was given clear instructions to go to ER or return to medical center if symptoms don't improve, worsen or new problems develop. The patient verbalized understanding. The patient was told to call to get lab results if they haven't heard anything in the next week.     Freeman Caldron, PA-C [facility] and [facility] [place], [place]   11/15/2020, 11:33 AM

## 2020-11-15 NOTE — Patient Instructions (Signed)
[email or web address] is the website for narcotics anonymous [place].org (website) or 336-854-4278 is the information for alcoholics anonymous Both are free and immediately available for help with alcohol and drug use  

## 2020-11-16 LAB — COMPREHENSIVE METABOLIC PANEL
ALT: 17 IU/L (ref 0–32)
AST: 35 IU/L (ref 0–40)
Albumin/Globulin Ratio: 1.1 — ABNORMAL LOW (ref 1.2–2.2)
Albumin: 3.8 g/dL (ref 3.8–4.9)
Alkaline Phosphatase: 82 IU/L (ref 44–121)
BUN/Creatinine Ratio: 13 (ref 9–23)
BUN: 19 mg/dL (ref 6–24)
Bilirubin Total: <0.2 mg/dL (ref 0.0–1.2)
CO2: 19 mmol/L — ABNORMAL LOW (ref 20–29)
Calcium: 8.5 mg/dL — ABNORMAL LOW (ref 8.7–10.2)
Chloride: 107 mmol/L — ABNORMAL HIGH (ref 96–106)
Creatinine, Ser: 1.44 mg/dL — ABNORMAL HIGH (ref 0.57–1.00)
GFR calc Af Amer: 48 mL/min/{1.73_m2} — ABNORMAL LOW (ref >59)
GFR calc non Af Amer: 41 mL/min/{1.73_m2} — ABNORMAL LOW (ref >59)
Globulin, Total: 3.4 g/dL (ref 1.5–4.5)
Glucose: 73 mg/dL (ref 65–99)
Potassium: 4 mmol/L (ref 3.5–5.2)
Sodium: 141 mmol/L (ref 134–144)
Total Protein: 7.2 g/dL (ref 6.0–8.5)

## 2020-11-16 LAB — CBC WITH DIFFERENTIAL/PLATELET
Basophils Absolute: 0 10*3/uL (ref 0.0–0.2)
Basos: 1 %
EOS (ABSOLUTE): 0.1 10*3/uL (ref 0.0–0.4)
Eos: 2 %
Hematocrit: 37.6 % (ref 34.0–46.6)
Hemoglobin: 12.6 g/dL (ref 11.1–15.9)
Immature Grans (Abs): 0 10*3/uL (ref 0.0–0.1)
Immature Granulocytes: 1 %
Lymphocytes Absolute: 0.9 10*3/uL (ref 0.7–3.1)
Lymphs: 24 %
MCH: 30.7 pg (ref 26.6–33.0)
MCHC: 33.5 g/dL (ref 31.5–35.7)
MCV: 92 fL (ref 79–97)
Monocytes Absolute: 0.5 10*3/uL (ref 0.1–0.9)
Monocytes: 14 %
Neutrophils Absolute: 2.2 10*3/uL (ref 1.4–7.0)
Neutrophils: 58 %
Platelets: 167 10*3/uL (ref 150–450)
RBC: 4.1 x10E6/uL (ref 3.77–5.28)
RDW: 13.9 % (ref 11.7–15.4)
WBC: 3.8 10*3/uL (ref 3.4–10.8)

## 2020-11-16 LAB — H. PYLORI BREATH TEST: H pylori Breath Test: NEGATIVE

## 2020-11-27 ENCOUNTER — Encounter: Payer: Self-pay | Admitting: *Deleted

## 2020-11-27 NOTE — Progress Notes (Signed)
On 11/16/20 sent completed prescription and certificate of medical necessity back to [employer] for adult diapers. Fax [phone]

## 2020-12-06 ENCOUNTER — Ambulatory Visit: Payer: Medicaid Other | Admitting: Pharmacist

## 2020-12-10 ENCOUNTER — Telehealth: Payer: Self-pay | Admitting: Physician Assistant

## 2020-12-10 NOTE — Telephone Encounter (Signed)
Patient called to reschedule her appt. With [employer].  Please call patient to reschedule at [phone]

## 2020-12-10 NOTE — Telephone Encounter (Signed)
I call back the Pt and schedule an appt for 12/12/20

## 2020-12-11 NOTE — Progress Notes (Unsigned)
   S:     PMH: HTN, CKD-3, GERD, hx of polysubstance abuse (cocaine, EtOH), bipolar 1, depression.     Patient arrives in good spirits. Presents to the clinic for hypertension evaluation, counseling, and management.  Patient was referred and last seen by Primary Care Provider on 11/15/20 and BP was elevated at 169/109 and elevated HR 103. Pt reported not taking any medications, and discussed DASH diet and eliminating alcohol. Pt resumed on amlodipine 10 mg daily and clonidine 0.1 mg TID.  Today, patient reports ***  CMET - K 4, Scr 1.44, eGFR 41  Compliance? Took meds this morning? When do you take your meds? Dizziness, headaches, blurred vision? History of swelling? Check Clinic BP? Home BP logs? If no logs, bring to next visit w/ BP cuff Go over BP goals Additional BP therapy if needed . ACE/ARB - hx of CKD Diet??  Exercise??    Medication adherence *** .  Current BP Medications include:  Amlodipine 10 mg daily, clonidine 0.1 mg TID  Antihypertensives tried in the past include: ***  Dietary habits include: *** Exercise habits include:*** Family / Social history: -Fhx: -Tobacco use: current every day smoker (0.5 ppd U88 years) -illicit drugs: hx of cocaine use -Alcohol: hx of EtOH abuse    O:   Home BP readings: ***  Last 3 Office BP readings: BP Readings from Last 3 Encounters:  11/15/20 (!) 167/109  05/26/20 (!) 140/84  04/08/20 134/80    BMET    Component Value Date/Time   NA 141 11/15/2020 1153   NA 133 (L) 04/22/2017 1512   K 4.0 11/15/2020 1153   K 3.9 04/22/2017 1512   CL 107 (H) 11/15/2020 1153   CO2 19 (L) 11/15/2020 1153   CO2 24 04/22/2017 1512   GLUCOSE 73 11/15/2020 1153   GLUCOSE 83 05/26/2020 0129   GLUCOSE 79 04/22/2017 1512   BUN 19 11/15/2020 1153   BUN 9.5 04/22/2017 1512   CREATININE 1.44 (H) 11/15/2020 1153   CREATININE 0.6 04/22/2017 1512   CALCIUM 8.5 (L) 11/15/2020 1153   CALCIUM 9.4 04/22/2017 1512   GFRNONAA 41 (L)  11/15/2020 1153   GFRAA 48 (L) 11/15/2020 1153    Renal function: CrCl cannot be calculated (Patient's most recent lab result is older than the maximum 21 days allowed.).  Clinical ASCVD: {YES/NO:21197} The ASCVD Risk score Mikey Bussing DC Jr., et al., 2013) failed to calculate for the following reasons:   Cannot find a previous HDL lab   Cannot find a previous total cholesterol lab  PHQ-2 Score: ***   A/P: Hypertension diagnosed *** currently *** on current medications. BP Goal = < *** mmHg. Medication adherence ***.  -{Meds adjust:18428} ***.  -F/u labs ordered - *** -Counseled on lifestyle modifications for blood pressure control including reduced dietary sodium, increased exercise, adequate sleep.  Results reviewed and written information provided.   Total time in face-to-face counseling *** minutes.   F/U Clinic Visit in ***.   Lorel Monaco, PharmD, [place] PGY2 Ambulatory Care Resident [place]

## 2020-12-12 ENCOUNTER — Ambulatory Visit: Payer: Medicaid Other | Admitting: Pharmacist

## 2020-12-13 ENCOUNTER — Telehealth: Payer: Self-pay

## 2020-12-13 ENCOUNTER — Telehealth: Payer: Self-pay | Admitting: Family Medicine

## 2020-12-13 NOTE — Telephone Encounter (Signed)
[facility] transportation called and stated they have been unable to reach patient and just wanted to let me know. She is all set up she just needs to confirm her ride. Thank you!

## 2020-12-13 NOTE — Telephone Encounter (Signed)
   Sherry Baker DOB: 03-22-1966 MRN: [id number]   RIDER WAIVER AND RELEASE OF LIABILITY  For purposes of improving physical access to our facilities, [facility] is pleased to partner with third parties to provide [facility] patients or other authorized individuals the option of convenient, on-demand ground transportation services (the Technical brewer") through use of the technology service that enables users to request on-demand ground transportation from independent third-party providers.  By opting to use and accept these [employer], I, the undersigned, hereby agree on behalf of myself, and on behalf of any minor child using the [employer] for whom I am the parent or legal guardian, as follows:  1. Government social research officer provided to me are provided by independent third-party transportation providers who are not [employer] or employees and who are unaffiliated with [employer]. 2. [facility] is neither a transportation carrier nor a common or public carrier. 3. [facility] has no control over the quality or safety of the transportation that occurs as a result of the [employer]. 4. [facility] cannot guarantee that any third-party transportation provider will complete any arranged transportation service. 5. [facility] makes no representation, warranty, or guarantee regarding the reliability, timeliness, quality, safety, suitability, or availability of any of the Transport Services or that they will be error free. 6. I fully understand that traveling by vehicle involves risks and dangers of serious bodily injury, including permanent disability, paralysis, and death. I agree, on behalf of myself and on behalf of any minor child using the Transport Services for whom I am the parent or legal guardian, that the entire risk arising out of my use of the [employer] remains solely with me, to the maximum extent permitted under applicable law. 7. The [employer] are provided "as is" and "as available." [facility] disclaims all representations and warranties, express, implied or statutory, not expressly set out in these terms, including the implied warranties of merchantability and fitness for a particular purpose. 8. I hereby waive and release [facility], its agents, employees, officers, directors, representatives, insurers, attorneys, assigns, successors, subsidiaries, and affiliates from any and all past, present, or future claims, demands, liabilities, actions, causes of action, or suits of any kind directly or indirectly arising from acceptance and use of the [employer]. 9. I further waive and release [facility] and its affiliates from all present and future liability and responsibility for any injury or death to persons or damages to property caused by or related to the use of the [employer]. 10. I have read this Waiver and Release of Liability, and I understand the terms used in it and their legal significance. This Waiver is freely and voluntarily given with the understanding that my right (as well as the right of any minor child for whom I am the parent or legal guardian using the [employer]) to legal recourse against [place] in connection with the [employer] is knowingly surrendered in return for use of these services.   I attest that I read the consent document to Sherry Baker, gave Ms. Santarelli the opportunity to ask questions and answered the questions asked (if any). I affirm that Sherry Baker then provided consent for she's participation in this program.     Legrand Pitts

## 2020-12-13 NOTE — Telephone Encounter (Signed)
Copied from [place] [phone]. Topic: General - Other >> Dec 12, 2020  4:26 PM Leward Quan A wrote: Reason for CRM: Patient signed up with transportation but they are asking for a referral so that she can have a ride for her visit on 12/18/20. Any questions please call  Ph# [phone]   Patient called in to reschedule her appointment with Lurena Joiner and stated that she couldn't make it because she does not have transportation. I offered to set her up with [facility] and called them to schedule. She called back later yesterday afternoon stating she needed a referral?   Does patient just need to sign rider waiver or is there another reason she needs a referral to use transportation?

## 2020-12-18 ENCOUNTER — Ambulatory Visit: Payer: Medicaid Other | Attending: Family Medicine | Admitting: Pharmacist

## 2020-12-18 ENCOUNTER — Other Ambulatory Visit: Payer: Self-pay

## 2020-12-18 ENCOUNTER — Telehealth: Payer: Self-pay | Admitting: Pharmacist

## 2020-12-18 ENCOUNTER — Encounter: Payer: Self-pay | Admitting: Pharmacist

## 2020-12-18 VITALS — BP 151/94 | HR 90

## 2020-12-18 DIAGNOSIS — F4323 Adjustment disorder with mixed anxiety and depressed mood: Secondary | ICD-10-CM

## 2020-12-18 DIAGNOSIS — I10 Essential (primary) hypertension: Secondary | ICD-10-CM

## 2020-12-18 NOTE — Telephone Encounter (Signed)
Dr. Margarita Rana,   Please see our note from today. Patient is requesting behavioral health referral. She has Medicaid.

## 2020-12-18 NOTE — Progress Notes (Signed)
   PCP: Freeman Caldron   S:    Patient presents to the clinic for hypertension evaluation, counseling, and management. Patient was referred and last seen by Freeman Caldron on 11/15/2020 and at that visit antihypertensives were resumed.  Pt denies medication adherence. She has not had medications today and endorses missed doses this past week due to sleepiness. Today, she is ill-appearing, malnourished and ambulating slowly. She is very tearful on presentation stating that she has no one to take care of her and she is worried about her BP, GI issues, and general health overall. She reports feeling nervous all the time and living with people who "get on her nerves". She reports that her anxiety medications do not help her. She reports symptoms suspicious of withdrawal including cold chills, lightheadedness, and sleepiness.   Current BP Medications include:  amlodipine 10 mg daily, clonidine 0.1 mg TID  Dietary habits include:  - lack of appetite, but sometimes eats sandwiches  Social history:  - current smoker - endorses alcohol consumption (reports alcohol helps calm her nerves) - denies current use of non-prescribed or recreational medications - poor living situation - lack of family connection  Family History: - mother and maternal aunt: cancer  O:  Vitals:   12/18/20 1133  BP: (!) 151/94  Pulse: 90   Home BP readings: none  Last 3 Office BP readings: BP Readings from Last 3 Encounters:  11/15/20 (!) 167/109  05/26/20 (!) 140/84  04/08/20 134/80   BMET    Component Value Date/Time   NA 141 11/15/2020 1153   NA 133 (L) 04/22/2017 1512   K 4.0 11/15/2020 1153   K 3.9 04/22/2017 1512   CL 107 (H) 11/15/2020 1153   CO2 19 (L) 11/15/2020 1153   CO2 24 04/22/2017 1512   GLUCOSE 73 11/15/2020 1153   GLUCOSE 83 05/26/2020 0129   GLUCOSE 79 04/22/2017 1512   BUN 19 11/15/2020 1153   BUN 9.5 04/22/2017 1512   CREATININE 1.44 (H) 11/15/2020 1153   CREATININE 0.6 04/22/2017  1512   CALCIUM 8.5 (L) 11/15/2020 1153   CALCIUM 9.4 04/22/2017 1512   GFRNONAA 41 (L) 11/15/2020 1153   GFRAA 48 (L) 11/15/2020 1153   Renal function: CrCl cannot be calculated (Patient's most recent lab result is older than the maximum 21 days allowed.).  Clinical ASCVD: NO The ASCVD Risk score Mikey Bussing DC Jr., et al., 2013) failed to calculate for the following reasons:   Cannot find a previous HDL lab   Cannot find a previous total cholesterol lab  A/P: Hypertension longstanding currently uncontrolled on current medications. BP Goal = <130/80 mmHg. Medication adherence denied due to hypersomnolence. Due to her deteriorating mental health, poor social situation, and suspected substance abuse, pt needs specialized behavioral health care. - Continue amlodipine 10 mg daily and clonidine 0.1 mg TID (encouraged adherence to this regimen) - Provided her with contact information for Christa See, LCSW (pt stated she would call tomorrow 2/16) - Contacted PCP for behavioral health referral - Counseled on lifestyle modifications for blood pressure control including reduced dietary sodium, increased exercise, adequate sleep.  Results reviewed and written information provided. Total time in face-to-face counseling 35  minutes.   F/U Clinic Visit with pharmacist in 2 weeks.  Haynes Dage, PharmD Candidate [employer]  Class of 2024  Benard Halsted, PharmD, [place], [place] [phone]

## 2020-12-19 ENCOUNTER — Telehealth: Payer: Self-pay | Admitting: *Deleted

## 2020-12-19 NOTE — Telephone Encounter (Signed)
I cannot complete this form for her, it must be completed by her PCP.

## 2020-12-19 NOTE — Telephone Encounter (Signed)
Referral placed.

## 2020-12-19 NOTE — Telephone Encounter (Signed)
Bonnita Hollow from [facility] will be faxing the patient's Medicaid MCD form to be completed. It was signed by physician on 2/14 but in order to process it has to be completely filled out by the provider. Questions 19-30 were not completed by the provider. The patient will not get the additional briefs she has requested until the form is completed.

## 2020-12-19 NOTE — Addendum Note (Signed)
Addended by: Charlott Rakes on: 12/19/2020 12:49 PM   Modules accepted: Orders

## 2020-12-24 ENCOUNTER — Telehealth: Payer: Self-pay

## 2020-12-24 NOTE — Telephone Encounter (Signed)
Pt needs to set an appointment.

## 2020-12-24 NOTE — Telephone Encounter (Signed)
Copied from [place] [phone]. Topic: General - Other >> Dec 14, 2020 12:42 PM Alanda Slim E wrote: Reason for CRM: wanted to confirm the  Medicaid MCB form was received/ she will fax again incase/ please advise >> Dec 24, 2020 11:30 AM Keene Breath wrote: [place] is calling again to check the status of the Form that she needs to complete the patients information.  She stated she has not heard from anyone and since Dr. Chapman Fitch is no longer at the practice, she needs to know when this form can be completed.  Please advise and call to discuss at [phone], ext. [phone] >> Dec 14, 2020  2:19 PM [place], [place] wrote: Fax has been received and will be given to PCP.    I received the fax 2/11 and placed in [facility]'s box since patient used to be [facility]'s patient. Please advise if form can be filled or if she needs an appt.

## 2020-12-27 NOTE — Telephone Encounter (Signed)
Pt needs to est care with provider then paperwork will be filled out

## 2020-12-27 NOTE — Telephone Encounter (Signed)
Sherry Baker was calling to follow up on the forms and stated the form was only signed but not properly filled out. Please advise.

## 2020-12-27 NOTE — Telephone Encounter (Signed)
Hi!  No forms have been left for me to look at.  ?  Just FYI  Thanks, Freeman Caldron, PA-C

## 2021-01-01 NOTE — Progress Notes (Signed)
   S:     PCP: Dr. Margarita Rana PMH: HTN, CKD-3, GERD, hx of polysubstance abuse (cocaine, EtOH), bipolar 1, depression.    Patient arrives in good spirits. Presents to the clinic for hypertension evaluation, counseling, and management.  Patient was referred and last seen by Freeman Caldron on 11/15/20. Pt last seen by pharmacy on 12/18/20 and BP was elevated at 151/94. Pt denied medication adherence due to hypersomnolence and depressive symptoms (secondary to her poor social situation, and suspected substance abuse). Dr. Margarita Rana placed behavorial health referral.   Today, patient reports being out of amlodipine for ~1 week. Reports taking clonidine in the mornings, however, sometimes skips evening dose due to excessive sedation. Reports bad headaches. Denies dizziness, blurred vision and LE swelling. Reports not checking BP at home.  Medication adherence poor.  Current BP Medications include:  amlodipine 10 mg daily (ran out), clonidine 0.1 mg TID (taking daily, sometimes BID)  Dietary habits include:  - lack of appetite, but sometimes eats sandwiches  Social history:  - current smoker (0.5 ppd W96 years) - illicit drugs: hx of cocaine use - endorses alcohol consumption (reports alcohol helps calm her nerves) - denies current use of non-prescribed or recreational medications - poor living situation - lack of family connection  Family History: - mother and maternal aunt: cancer  O:  Vitals:   01/02/21 1020  BP: (!) 160/97  Pulse: 81    Home BP readings: none  Last 3 Office BP readings: BP Readings from Last 3 Encounters:  01/02/21 (!) 160/97  12/18/20 (!) 151/94  11/15/20 (!) 167/109    BMET    Component Value Date/Time   NA 141 11/15/2020 1153   NA 133 (L) 04/22/2017 1512   K 4.0 11/15/2020 1153   K 3.9 04/22/2017 1512   CL 107 (H) 11/15/2020 1153   CO2 19 (L) 11/15/2020 1153   CO2 24 04/22/2017 1512   GLUCOSE 73 11/15/2020 1153   GLUCOSE 83 05/26/2020 0129    GLUCOSE 79 04/22/2017 1512   BUN 19 11/15/2020 1153   BUN 9.5 04/22/2017 1512   CREATININE 1.44 (H) 11/15/2020 1153   CREATININE 0.6 04/22/2017 1512   CALCIUM 8.5 (L) 11/15/2020 1153   CALCIUM 9.4 04/22/2017 1512   GFRNONAA 41 (L) 11/15/2020 1153   GFRAA 48 (L) 11/15/2020 1153    Renal function: CrCl cannot be calculated (Patient's most recent lab result is older than the maximum 21 days allowed.).  Clinical ASCVD: No  The ASCVD Risk score Mikey Bussing DC Jr., et al., 2013) failed to calculate for the following reasons:   Cannot find a previous HDL lab   Cannot find a previous total cholesterol lab   A/P: Hypertension longstanding currently poorly controlled on current medications. BP Goal = <130/80 mmHg. Medication adherence poor. Reminded patient she has 3 refills left with amlodipine and can pick up prescription from pharmacy. Discussed importance of medication adherence. Patient verbalized understanding. -Continued amlodipine 10 mg daily -Continued clonidine 0.1 mg TID -Counseled on lifestyle modifications for blood pressure control including reduced dietary sodium, increased exercise, adequate sleep.  Results reviewed and written information provided. Total time in face-to-face counseling 25 minutes.   F/U Clinic Visit in 2 weeks.   Lorel Monaco, PharmD, [place] PGY2 Ambulatory Care Resident [place]

## 2021-01-02 ENCOUNTER — Ambulatory Visit: Payer: Medicaid Other | Attending: Family Medicine | Admitting: Pharmacist

## 2021-01-02 ENCOUNTER — Encounter: Payer: Self-pay | Admitting: Pharmacist

## 2021-01-02 ENCOUNTER — Other Ambulatory Visit: Payer: Self-pay

## 2021-01-02 VITALS — BP 160/97 | HR 81

## 2021-01-02 DIAGNOSIS — I10 Essential (primary) hypertension: Secondary | ICD-10-CM | POA: Diagnosis not present

## 2021-01-03 NOTE — Telephone Encounter (Signed)
I still don't have a nurse assigned to me and won't be back in the office until next Wednesday. Please defer to whoever she has been assigned to her as PCP for these forms.  Thanks, Freeman Caldron, PA-C

## 2021-01-07 NOTE — Telephone Encounter (Signed)
Patient has new patient appointment with Dr. Margarita Rana on 02/05/21.

## 2021-01-08 ENCOUNTER — Telehealth: Payer: Self-pay

## 2021-01-08 NOTE — Telephone Encounter (Signed)
Copied from [place] [phone]. Topic: General - Inquiry >> Jan 08, 2021 10:28 AM Lennox Solders wrote: Reason for CRM:Pt is calling and would like to speak with jasmine or maddie. Pt is homeless  Patient has upcoming new patient appointment with [facility]. Not sure who Maddie is, Please advise.

## 2021-01-09 NOTE — Telephone Encounter (Signed)
Follow up call placed to patient. There was no answer and voicemail was not set-up to leave a return message.

## 2021-01-10 ENCOUNTER — Telehealth: Payer: Self-pay | Admitting: Licensed Clinical Social Worker

## 2021-01-10 NOTE — Telephone Encounter (Signed)
MSW Intern called pt to assist with needs or schedule appt with SW services, however there was no answer for home or mobile number and the option to leave a message was not provided.

## 2021-01-18 ENCOUNTER — Telehealth: Payer: Self-pay | Admitting: Licensed Clinical Social Worker

## 2021-01-18 NOTE — Telephone Encounter (Signed)
Message received on LCSW's voicemail to return call at [phone]. LCSW placed call and left detailed message with callback number.

## 2021-01-21 ENCOUNTER — Telehealth: Payer: Self-pay | Admitting: Licensed Clinical Social Worker

## 2021-01-21 ENCOUNTER — Ambulatory Visit: Payer: Medicaid Other | Admitting: Pharmacist

## 2021-01-21 NOTE — Telephone Encounter (Signed)
Call placed to patient at [phone]. LCSW left detailed message with callback number.

## 2021-02-05 ENCOUNTER — Ambulatory Visit: Payer: Medicaid Other | Admitting: Family Medicine

## 2021-03-28 ENCOUNTER — Emergency Department (HOSPITAL_COMMUNITY): Payer: Medicaid Other

## 2021-03-28 ENCOUNTER — Other Ambulatory Visit: Payer: Self-pay

## 2021-03-28 ENCOUNTER — Inpatient Hospital Stay (HOSPITAL_COMMUNITY)
Admission: EM | Admit: 2021-03-28 | Discharge: 2021-04-08 | DRG: 091 | Disposition: A | Discharge status: Home Health | Payer: Medicaid Other | Attending: Internal Medicine | Admitting: Internal Medicine

## 2021-03-28 DIAGNOSIS — F419 Anxiety disorder, unspecified: Secondary | ICD-10-CM | POA: Diagnosis present

## 2021-03-28 DIAGNOSIS — N179 Acute kidney failure, unspecified: Secondary | ICD-10-CM | POA: Diagnosis present

## 2021-03-28 DIAGNOSIS — N183 Chronic kidney disease, stage 3 unspecified: Secondary | ICD-10-CM | POA: Diagnosis present

## 2021-03-28 DIAGNOSIS — Z978 Presence of other specified devices: Secondary | ICD-10-CM

## 2021-03-28 DIAGNOSIS — E876 Hypokalemia: Secondary | ICD-10-CM | POA: Diagnosis present

## 2021-03-28 DIAGNOSIS — F10230 Alcohol dependence with withdrawal, uncomplicated: Secondary | ICD-10-CM

## 2021-03-28 DIAGNOSIS — Z20822 Contact with and (suspected) exposure to covid-19: Secondary | ICD-10-CM | POA: Diagnosis present

## 2021-03-28 DIAGNOSIS — J9601 Acute respiratory failure with hypoxia: Secondary | ICD-10-CM | POA: Diagnosis present

## 2021-03-28 DIAGNOSIS — R824 Acetonuria: Secondary | ICD-10-CM | POA: Diagnosis present

## 2021-03-28 DIAGNOSIS — Z4659 Encounter for fitting and adjustment of other gastrointestinal appliance and device: Secondary | ICD-10-CM

## 2021-03-28 DIAGNOSIS — N182 Chronic kidney disease, stage 2 (mild): Secondary | ICD-10-CM | POA: Diagnosis present

## 2021-03-28 DIAGNOSIS — Z880 Allergy status to penicillin: Secondary | ICD-10-CM

## 2021-03-28 DIAGNOSIS — Y929 Unspecified place or not applicable: Secondary | ICD-10-CM

## 2021-03-28 DIAGNOSIS — R64 Cachexia: Secondary | ICD-10-CM | POA: Diagnosis present

## 2021-03-28 DIAGNOSIS — F319 Bipolar disorder, unspecified: Secondary | ICD-10-CM | POA: Diagnosis present

## 2021-03-28 DIAGNOSIS — N1831 Chronic kidney disease, stage 3a: Secondary | ICD-10-CM | POA: Diagnosis present

## 2021-03-28 DIAGNOSIS — Z933 Colostomy status: Secondary | ICD-10-CM

## 2021-03-28 DIAGNOSIS — R4182 Altered mental status, unspecified: Secondary | ICD-10-CM | POA: Diagnosis present

## 2021-03-28 DIAGNOSIS — J690 Pneumonitis due to inhalation of food and vomit: Secondary | ICD-10-CM | POA: Diagnosis not present

## 2021-03-28 DIAGNOSIS — Z681 Body mass index (BMI) 19 or less, adult: Secondary | ICD-10-CM

## 2021-03-28 DIAGNOSIS — R404 Transient alteration of awareness: Secondary | ICD-10-CM

## 2021-03-28 DIAGNOSIS — J811 Chronic pulmonary edema: Secondary | ICD-10-CM

## 2021-03-28 DIAGNOSIS — C20 Malignant neoplasm of rectum: Secondary | ICD-10-CM | POA: Diagnosis present

## 2021-03-28 DIAGNOSIS — K219 Gastro-esophageal reflux disease without esophagitis: Secondary | ICD-10-CM | POA: Diagnosis present

## 2021-03-28 DIAGNOSIS — F17210 Nicotine dependence, cigarettes, uncomplicated: Secondary | ICD-10-CM | POA: Diagnosis present

## 2021-03-28 DIAGNOSIS — E43 Unspecified severe protein-calorie malnutrition: Secondary | ICD-10-CM | POA: Diagnosis present

## 2021-03-28 DIAGNOSIS — I10 Essential (primary) hypertension: Secondary | ICD-10-CM | POA: Diagnosis present

## 2021-03-28 DIAGNOSIS — F10231 Alcohol dependence with withdrawal delirium: Secondary | ICD-10-CM | POA: Diagnosis present

## 2021-03-28 DIAGNOSIS — J13 Pneumonia due to Streptococcus pneumoniae: Secondary | ICD-10-CM | POA: Diagnosis not present

## 2021-03-28 DIAGNOSIS — F10939 Alcohol use, unspecified with withdrawal, unspecified: Secondary | ICD-10-CM | POA: Diagnosis present

## 2021-03-28 DIAGNOSIS — G928 Other toxic encephalopathy: Principal | ICD-10-CM | POA: Diagnosis present

## 2021-03-28 DIAGNOSIS — F10239 Alcohol dependence with withdrawal, unspecified: Secondary | ICD-10-CM | POA: Diagnosis present

## 2021-03-28 DIAGNOSIS — Y900 Blood alcohol level of less than 20 mg/100 ml: Secondary | ICD-10-CM | POA: Diagnosis present

## 2021-03-28 DIAGNOSIS — F1410 Cocaine abuse, uncomplicated: Secondary | ICD-10-CM | POA: Diagnosis present

## 2021-03-28 DIAGNOSIS — T405X5A Adverse effect of cocaine, initial encounter: Secondary | ICD-10-CM | POA: Diagnosis present

## 2021-03-28 DIAGNOSIS — F10929 Alcohol use, unspecified with intoxication, unspecified: Secondary | ICD-10-CM | POA: Diagnosis present

## 2021-03-28 DIAGNOSIS — J15211 Pneumonia due to Methicillin susceptible Staphylococcus aureus: Secondary | ICD-10-CM | POA: Diagnosis present

## 2021-03-28 DIAGNOSIS — F14121 Cocaine abuse with intoxication with delirium: Secondary | ICD-10-CM | POA: Diagnosis present

## 2021-03-28 DIAGNOSIS — R809 Proteinuria, unspecified: Secondary | ICD-10-CM | POA: Diagnosis present

## 2021-03-28 DIAGNOSIS — J9600 Acute respiratory failure, unspecified whether with hypoxia or hypercapnia: Secondary | ICD-10-CM

## 2021-03-28 DIAGNOSIS — I129 Hypertensive chronic kidney disease with stage 1 through stage 4 chronic kidney disease, or unspecified chronic kidney disease: Secondary | ICD-10-CM | POA: Diagnosis present

## 2021-03-28 DIAGNOSIS — K7460 Unspecified cirrhosis of liver: Secondary | ICD-10-CM | POA: Diagnosis present

## 2021-03-28 DIAGNOSIS — F14921 Cocaine use, unspecified with intoxication delirium: Secondary | ICD-10-CM

## 2021-03-28 DIAGNOSIS — F1010 Alcohol abuse, uncomplicated: Secondary | ICD-10-CM

## 2021-03-28 DIAGNOSIS — Z79899 Other long term (current) drug therapy: Secondary | ICD-10-CM

## 2021-03-28 DIAGNOSIS — D649 Anemia, unspecified: Secondary | ICD-10-CM | POA: Diagnosis present

## 2021-03-28 DIAGNOSIS — Z85048 Personal history of other malignant neoplasm of rectum, rectosigmoid junction, and anus: Secondary | ICD-10-CM

## 2021-03-28 DIAGNOSIS — R54 Age-related physical debility: Secondary | ICD-10-CM | POA: Diagnosis present

## 2021-03-28 LAB — ACETAMINOPHEN LEVEL: Acetaminophen (Tylenol), Serum: <10 ug/mL — ABNORMAL LOW (ref 10–30)

## 2021-03-28 LAB — COMPREHENSIVE METABOLIC PANEL
ALT: 25 U/L (ref 0–44)
AST: 27 U/L (ref 15–41)
Albumin: 3.9 g/dL (ref 3.5–5.0)
Alkaline Phosphatase: 89 U/L (ref 38–126)
Anion gap: 17 — ABNORMAL HIGH (ref 5–15)
BUN: 47 mg/dL — ABNORMAL HIGH (ref 6–20)
CO2: 16 mmol/L — ABNORMAL LOW (ref 22–32)
Calcium: 9.8 mg/dL (ref 8.9–10.3)
Chloride: 102 mmol/L (ref 98–111)
Creatinine, Ser: 1.68 mg/dL — ABNORMAL HIGH (ref 0.44–1.00)
GFR, Estimated: 36 mL/min — ABNORMAL LOW (ref >60)
Glucose, Bld: 77 mg/dL (ref 70–99)
Potassium: 5 mmol/L (ref 3.5–5.1)
Sodium: 135 mmol/L (ref 135–145)
Total Bilirubin: 1.2 mg/dL (ref 0.3–1.2)
Total Protein: 8.5 g/dL — ABNORMAL HIGH (ref 6.5–8.1)

## 2021-03-28 LAB — CBC WITH DIFFERENTIAL/PLATELET
Abs Immature Granulocytes: 0.02 10*3/uL (ref 0.00–0.07)
Basophils Absolute: 0 10*3/uL (ref 0.0–0.1)
Basophils Relative: 0 %
Eosinophils Absolute: 0 10*3/uL (ref 0.0–0.5)
Eosinophils Relative: 0 %
HCT: 36 % (ref 36.0–46.0)
Hemoglobin: 11.8 g/dL — ABNORMAL LOW (ref 12.0–15.0)
Immature Granulocytes: 0 %
Lymphocytes Relative: 7 %
Lymphs Abs: 0.4 10*3/uL — ABNORMAL LOW (ref 0.7–4.0)
MCH: 30.1 pg (ref 26.0–34.0)
MCHC: 32.8 g/dL (ref 30.0–36.0)
MCV: 91.8 fL (ref 80.0–100.0)
Monocytes Absolute: 0.2 10*3/uL (ref 0.1–1.0)
Monocytes Relative: 4 %
Neutro Abs: 5.2 10*3/uL (ref 1.7–7.7)
Neutrophils Relative %: 89 %
Platelets: 244 10*3/uL (ref 150–400)
RBC: 3.92 MIL/uL (ref 3.87–5.11)
RDW: 13.5 % (ref 11.5–15.5)
WBC: 5.9 10*3/uL (ref 4.0–10.5)
nRBC: 0 % (ref 0.0–0.2)

## 2021-03-28 LAB — ETHANOL: Alcohol, Ethyl (B): <10 mg/dL (ref <10)

## 2021-03-28 LAB — URINALYSIS, ROUTINE W REFLEX MICROSCOPIC
Bacteria, UA: NONE SEEN
Bilirubin Urine: NEGATIVE
Glucose, UA: NEGATIVE mg/dL
Hgb urine dipstick: NEGATIVE
Ketones, ur: 20 mg/dL — AB
Leukocytes,Ua: NEGATIVE
Nitrite: NEGATIVE
Protein, ur: 30 mg/dL — AB
Specific Gravity, Urine: 1.011 (ref 1.005–1.030)
pH: 5 (ref 5.0–8.0)

## 2021-03-28 LAB — I-STAT VENOUS BLOOD GAS, ED
Acid-base deficit: 7 mmol/L — ABNORMAL HIGH (ref 0.0–2.0)
Bicarbonate: 17.3 mmol/L — ABNORMAL LOW (ref 20.0–28.0)
Calcium, Ion: 1.21 mmol/L (ref 1.15–1.40)
HCT: 28 % — ABNORMAL LOW (ref 36.0–46.0)
Hemoglobin: 9.5 g/dL — ABNORMAL LOW (ref 12.0–15.0)
O2 Saturation: 43 %
Potassium: 3.7 mmol/L (ref 3.5–5.1)
Sodium: 139 mmol/L (ref 135–145)
TCO2: 18 mmol/L — ABNORMAL LOW (ref 22–32)
pCO2, Ven: 29.3 mmHg — ABNORMAL LOW (ref 44.0–60.0)
pH, Ven: 7.378 (ref 7.250–7.430)
pO2, Ven: 24 mmHg — CL (ref 32.0–45.0)

## 2021-03-28 LAB — RAPID URINE DRUG SCREEN, HOSP PERFORMED
Amphetamines: NOT DETECTED
Barbiturates: NOT DETECTED
Benzodiazepines: NOT DETECTED
Cocaine: POSITIVE — AB
Opiates: NOT DETECTED
Tetrahydrocannabinol: NOT DETECTED

## 2021-03-28 LAB — RESP PANEL BY RT-PCR (FLU A&B, COVID) ARPGX2
Influenza A by PCR: NEGATIVE
Influenza B by PCR: NEGATIVE
SARS Coronavirus 2 by RT PCR: NEGATIVE

## 2021-03-28 LAB — CBG MONITORING, ED: Glucose-Capillary: 71 mg/dL (ref 70–99)

## 2021-03-28 LAB — SALICYLATE LEVEL: Salicylate Lvl: <7 mg/dL — ABNORMAL LOW (ref 7.0–30.0)

## 2021-03-28 LAB — AMMONIA: Ammonia: 19 umol/L (ref 9–35)

## 2021-03-28 LAB — CK: Total CK: 205 U/L (ref 38–234)

## 2021-03-28 MED ORDER — LORAZEPAM 2 MG/ML IJ SOLN
2.0000 mg | Freq: Once | INTRAMUSCULAR | Status: AC
  Administered 2021-03-28: 2 mg via INTRAVENOUS
  Filled 2021-03-28: qty 1

## 2021-03-28 MED ORDER — THIAMINE HCL 100 MG PO TABS: 100.0000 mg | ORAL_TABLET | Freq: Every day | ORAL | Status: DC

## 2021-03-28 MED ORDER — MAGNESIUM SULFATE 2 GM/50ML IV SOLN
2.0000 g | Freq: Once | INTRAVENOUS | Status: AC
  Administered 2021-03-29: 2 g via INTRAVENOUS
  Filled 2021-03-28: qty 50

## 2021-03-28 MED ORDER — LORAZEPAM 2 MG/ML IJ SOLN
0.0000 mg | INTRAMUSCULAR | Status: DC
  Administered 2021-03-29: 4 mg via INTRAVENOUS
  Filled 2021-03-28: qty 2
  Filled 2021-03-28 (×2): qty 1

## 2021-03-28 MED ORDER — LORAZEPAM 2 MG/ML IJ SOLN
0.0000 mg | Freq: Three times a day (TID) | INTRAMUSCULAR | Status: DC
Start: 2021-03-30 — End: 2021-03-29

## 2021-03-28 MED ORDER — THIAMINE HCL 100 MG/ML IJ SOLN
100.0000 mg | Freq: Once | INTRAMUSCULAR | Status: AC
  Administered 2021-03-28: 100 mg via INTRAVENOUS
  Filled 2021-03-28: qty 2

## 2021-03-28 MED ORDER — PANTOPRAZOLE SODIUM 40 MG IV SOLR
40.0000 mg | Freq: Once | INTRAVENOUS | Status: AC
  Administered 2021-03-29: 40 mg via INTRAVENOUS
  Filled 2021-03-28: qty 40

## 2021-03-28 MED ORDER — LORAZEPAM 1 MG PO TABS: 1.0000 mg | ORAL_TABLET | ORAL | Status: DC | PRN

## 2021-03-28 MED ORDER — PROCHLORPERAZINE EDISYLATE 10 MG/2ML IJ SOLN: 5.0000 mg | Freq: Four times a day (QID) | INTRAMUSCULAR | Status: DC | PRN

## 2021-03-28 MED ORDER — FOLIC ACID 1 MG PO TABS: 1.0000 mg | ORAL_TABLET | Freq: Every day | ORAL | Status: DC

## 2021-03-28 MED ORDER — THIAMINE HCL 100 MG/ML IJ SOLN: 100.0000 mg | Freq: Every day | INTRAMUSCULAR | Status: DC

## 2021-03-28 MED ORDER — LORAZEPAM 2 MG/ML IJ SOLN
1.0000 mg | INTRAMUSCULAR | Status: DC | PRN
  Administered 2021-03-29: 4 mg via INTRAVENOUS
  Administered 2021-03-29: 2 mg via INTRAVENOUS
  Administered 2021-03-29 (×5): 4 mg via INTRAVENOUS
  Filled 2021-03-28 (×4): qty 2
  Filled 2021-03-28: qty 1
  Filled 2021-03-28: qty 2

## 2021-03-28 MED ORDER — ADULT MULTIVITAMIN W/MINERALS CH
1.0000 | ORAL_TABLET | Freq: Every day | ORAL | Status: DC
  Administered 2021-03-31 – 2021-04-01 (×2): 1 via ORAL
  Filled 2021-03-28 (×2): qty 1

## 2021-03-28 MED ORDER — LACTATED RINGERS IV BOLUS
1000.0000 mL | Freq: Once | INTRAVENOUS | Status: AC
  Administered 2021-03-28: 1000 mL via INTRAVENOUS

## 2021-03-28 MED ORDER — LACTATED RINGERS IV SOLN: INTRAVENOUS | Status: DC

## 2021-03-28 NOTE — ED Triage Notes (Signed)
Pt brought in via [facility] with c/c of AMS. Per EMS pt found in home "full of men, altered, lying on the floor with dried blood". Pt presents with dried blood to left side of face. EMS also states beer cans were all over the house and that everyone else in house was intoxicated.   120/82, 100%, BS 94, 93HR

## 2021-03-28 NOTE — ED Notes (Signed)
Urine culture sent down with urine.

## 2021-03-28 NOTE — ED Notes (Signed)
Patient placed on 2L at this time due to desaturation while sleeping. Patient began grunting and yelling out with no comprehensible words.

## 2021-03-28 NOTE — H&P (Signed)
History and Physical    Sherry Baker UXL:[id number] DOB: May 24, 1966 DOA: 03/28/2021  PCP: Default, Provider, MD  Patient coming from: Home.  I have personally briefly reviewed patient's old medical records in [place]  Chief Complaint: Alcohol withdrawal.  HPI: Sherry Baker is a 55 y.o. female with medical history significant of alcohol and cocaine abuse, osteoarthritis, bipolar 1 disorder, rectal cancer with history of abdominal perineal bowel resection, diverting colostomy, cirrhosis of the liver, bipolar disorder who was brought to the emergency department via EMS due to altered mental status.  According to EMS when they arrived the patient was altered, lying on the floor with dried blood and there were multiple males at home.  There were numerous beer cans all over the house and it appeared to the EMS crew that everyone present in the house was intoxicated.  The patient started having delirium tremens while in the emergency department and received 6 mg total of lorazepam before I evaluated her.  She was unable to provide further information at the moment.  ED Course: Initial vital signs were temperature 97.6 F, pulse 65, pulse 100, respirations 24, BP 129/75 mmHg and O2 sat 100% on room air.  In addition to lorazepam the patient received 2000 mL of LR bolus and 100 mg of thiamine IVP.  I added magnesium sulfate 2 g IVPB and pantoprazole 40 mg IVP x1 dose.  Lab work: Her urinalysis showed ketonuria 20 and proteinuria 30 mg/dL, it was otherwise unremarkable.  Rapid UDS was positive for cocaine.  Ammonia level was 19 mol/L.  CBC showed a white count of 5.9, hemoglobin 11.8 g/dL platelets 244.  CMP showed a CO2 of 16 mmol/L with an anion gap of 17.  Calcium was 9.3, magnesium 2.0, phosphorus 3.5 BUN 47 and creatinine 1.68 mg/dL. Alcohol level was less than 10 mg/dL.  Normal salicylate and acetaminophen.  Imaging: Portable chest radiograph did not show any active cardiopulmonary  disease.  CT head did not show any acute intracranial pathology.  Please see images and full radiology report for further details.  Review of Systems: As per HPI otherwise all other systems reviewed and are negative.  Past Medical History:  Diagnosis Date  . Alcohol abuse   . Allergy    PCNS swelling  . Arthritis   . Bipolar 1 disorder ([place])   . Cancer ([place]) 01/21/2017   rectal cancer  . Cancer ([place])   . Cirrhosis of liver ([place])   . Depression   . Genetic testing 03/24/2017   Ms. Shain underwent genetic counseling and testing for hereditary cancer syndromes on 02/17/2017. Her results were negative for mutations in all 46 genes analyzed by [facility]'s 46-gene Common Hereditary Cancers Panel. Genes analyzed include: APC, ATM, AXIN2, BARD1, BMPR1A, BRCA1, BRCA2, BRIP1, CDH1, CDKN2A, CHEK2, CTNNA1, DICER1, EPCAM, GREM1, HOXB13, KIT, MEN1, MLH1, MSH2, MSH3, MSH6, MUTYH, NBN,  . Hypertension     Past Surgical History:  Procedure Laterality Date  . ABDOMINAL PERINEAL BOWEL RESECTION N/A 06/18/2017   Procedure: ABDOMINAL PERINEAL RESECTION ERAS PATHWAY;  Surgeon: Leighton Ruff, MD;  Location: [facility];  Service: General;  Laterality: N/A;  . COLON SURGERY    . COLONOSCOPY WITH PROPOFOL Left 01/21/2017   Procedure: COLONOSCOPY WITH PROPOFOL;  Surgeon: Otis Brace, MD;  Location: [facility];  Service: Gastroenterology;  Laterality: Left;  . FRACTURE SURGERY    . MANDIBLE FRACTURE SURGERY      Social History  reports that she has been smoking cigarettes. She has  a 19.50 pack-year smoking history. She has never used smokeless tobacco. She reports current alcohol use. She reports current drug use. Drugs: "Crack" cocaine and Cocaine.  Allergies  Allergen Reactions  . Penicillins Hives and Swelling    Did it involve swelling of the face/tongue/throat, SOB, or low BP? Yes Did it involve sudden or severe rash/hives, skin peeling, or any reaction on the inside of your mouth or nose? No Did  you need to seek medical attention at a hospital or doctor's office? No When did it last happen?<10 years If all above answers are "NO", may proceed with cephalosporin use.   Marland Kitchen Penicillins Hives and Swelling    Has patient had a PCN reaction causing immediate rash, facial/tongue/throat swelling, SOB or lightheadedness with hypotension: Yes Has patient had a PCN reaction causing severe rash involving mucus membranes or skin necrosis: No Has patient had a PCN reaction that required hospitalization No Has patient had a PCN reaction occurring within the last 10 years: Yes If all of the above answers are "NO", then may proceed with Cephalosporin use.     Family History  Problem Relation Age of Onset  . Cancer Mother 39       Originating in the abdomen, otherwise unknwon  . Cancer - Other Maternal Aunt        Throat   Prior to Admission medications   Medication Sig Start Date End Date Taking? Authorizing Provider  amLODipine (NORVASC) 10 MG tablet Take 1 tablet (10 mg total) by mouth daily. 11/15/20   Argentina Donovan, PA-C  busPIRone (BUSPAR) 15 MG tablet Take 1 tablet (15 mg total) by mouth 2 (two) times daily. 11/15/20   Argentina Donovan, PA-C  cloNIDine (CATAPRES) 0.1 MG tablet Take 1 tablet (0.1 mg total) by mouth 3 (three) times daily. 11/15/20   Argentina Donovan, PA-C  hydrOXYzine (ATARAX/VISTARIL) 10 MG tablet Take 1 tablet (10 mg total) by mouth 3 (three) times daily as needed. As needed for acute anxiety 11/15/20   Argentina Donovan, PA-C  omeprazole (PRILOSEC) 40 MG capsule Take 1 capsule (40 mg total) by mouth daily. 11/15/20   Argentina Donovan, PA-C  ziprasidone (GEODON) 40 MG capsule Take 1 capsule (40 mg total) by mouth 2 (two) times daily with a meal. 11/06/11 01/09/12  Daleen Bo, MD    Physical Exam: Vitals:   03/28/21 2030 03/28/21 2100 03/28/21 2115 03/28/21 2130  BP: (!) 161/79 133/74  (!) 142/74  Pulse: (!) 139 (!) 110 (!) 105 100  Resp: (!) 38 (!) 23 (!) 23 (!)  22  Temp:      TempSrc:      SpO2: 90% 100% 100% 98%  Weight:      Height:       Constitutional: Sedated.  NAD, calm, comfortable Eyes: PERRL, lids and conjunctivae normal ENMT: Positive periorbital edema, small wound and mild ecchymosis.  There is a left-sided supraorbital wound.  Mucous membranes and lips are very dry. (see picture below's).   Poor state of repair of dentition.  Neck: normal, supple, no masses, no thyromegaly Respiratory: clear to auscultation bilaterally, no wheezing, no crackles. Normal respiratory effort. No accessory muscle use.  Cardiovascular: Regular rate and rhythm, no murmurs / rubs / gallops. No extremity edema. 2+ pedal pulses. No carotid bruits.  Abdomen: No distention.  Positive left-sided diverting colostomy. Bowel sounds positive.  Soft, no tenderness, no masses palpated. No hepatosplenomegaly.  Musculoskeletal: no clubbing / cyanosis.  Good ROM, no contractures.  Mildly relaxed muscle tone.  Skin: Multiple excoriations on extremities (see pictures below). Neurologic: Sedated.  Cranial nerves look intact.  Unable to fully evaluate. Psychiatric: Sedated.  Responds to tactile stimuli.            Labs on Admission: I have personally reviewed following labs and imaging studies  CBC: Recent Labs  Lab 03/28/21 1317  WBC 5.9  NEUTROABS 5.2  HGB 11.8*  HCT 36.0  MCV 91.8  PLT 122    Basic Metabolic Panel: Recent Labs  Lab 03/28/21 1317  NA 135  K 5.0  CL 102  CO2 16*  GLUCOSE 77  BUN 47*  CREATININE 1.68*  CALCIUM 9.8    GFR: Estimated Creatinine Clearance: 28.4 mL/min (A) (by C-G formula based on SCr of 1.68 mg/dL (H)).  Liver Function Tests: Recent Labs  Lab 03/28/21 1317  AST 27  ALT 25  ALKPHOS 89  BILITOT 1.2  PROT 8.5*  ALBUMIN 3.9    Urine analysis:    Component Value Date/Time   COLORURINE STRAW (A) 03/28/2021 1240   APPEARANCEUR CLEAR 03/28/2021 1240   LABSPEC 1.011 03/28/2021 1240   PHURINE 5.0  03/28/2021 1240   GLUCOSEU NEGATIVE 03/28/2021 1240   [place] 03/28/2021 [place] 03/28/2021 1240   KETONESUR 20 (A) 03/28/2021 1240   PROTEINUR 30 (A) 03/28/2021 1240   UROBILINOGEN 0.2 12/23/2017 1417   NITRITE NEGATIVE 03/28/2021 1240   LEUKOCYTESUR NEGATIVE 03/28/2021 1240    Radiological Exams on Admission: CT Head Wo Contrast  Result Date: 03/28/2021 CLINICAL DATA:  55 year old female with altered mental status. EXAM: CT HEAD WITHOUT CONTRAST TECHNIQUE: Contiguous axial images were obtained from the base of the skull through the vertex without intravenous contrast. COMPARISON:  Head CT dated 04/07/2020. FINDINGS: Brain: The ventricles and sulci are appropriate size for patient's age. The gray-white matter discrimination is preserved. There is no acute intracranial hemorrhage. No mass effect or midline shift. No extra-axial fluid collection. Vascular: No hyperdense vessel or unexpected calcification. Skull: Normal. Negative for fracture or focal lesion. Sinuses/Orbits: There is diffuse mucoperiosteal thickening of paranasal sinuses with near complete opacification of the right maxillary sinus. The mastoid air cells are clear. Cerumen noted in the right external auditory canal. Other: None IMPRESSION: 1. No acute intracranial pathology. 2. Paranasal sinus disease. Electronically Signed   By: Anner Crete M.D.   On: 03/28/2021 15:37   DG Chest Portable 1 View  Result Date: 03/28/2021 CLINICAL DATA:  Rhonchi altered EXAM: PORTABLE CHEST 1 VIEW COMPARISON:  05/26/2020 FINDINGS: Slight asymmetric opacity in right thorax probably related to positioning and rotation. No focal airspace disease or pleural effusion. Mild bronchitic changes. Normal cardiomediastinal silhouette. No pneumothorax. IMPRESSION: No active disease allowing for rotation and patient positioning. Electronically Signed   By: Donavan Foil M.D.   On: 03/28/2021 16:27    EKG: Independently reviewed.   Vent. rate 80 BPM PR interval 133 ms QRS duration 98 ms QT/QTcB 390/450 ms P-R-T axes 82 77 87 Sinus rhythm Probable left atrial enlargement Nonspecific T abnrm, anterolateral leads  Assessment/Plan Principal Problem:   Alcohol withdrawal (HCC) Observation/PCU. Daily folate, MVI and thiamine. Magnesium sulfate 2 g IVPB. Begin CIWA protocol. Transition of care team evaluation.  Active Problems:   Cocaine abuse ([place]) Consult TOC. Avoid beta-blockers for hypertension    History of rectal cancer ([place]) Continue colostomy care.    Hypertension Continue amlodipine 10 mg p.o. daily. Will not restart clonidine. Monitor blood pressure and heart rate.  GERD (gastroesophageal reflux disease) Continue pantoprazole 40 mg p.o. daily.    CKD (chronic kidney disease), stage III (HCC) Monitor renal function electrolytes.    Normochromic normocytic anemia 05/19/2019 anemia panel with low normal ferritin/iron Monitor hematocrit and hemoglobin. Iron supplementation as needed.   DVT prophylaxis: SCDs. Code Status:   Full code. Family Communication: Disposition Plan:   Patient is from:  Home.  Anticipated DC to:  Home.  Anticipated DC date:  04/01/2021.  Anticipated DC barriers: Clinical status.  Consults called:  TOC team. Admission status:  Ulceration/progressive unit.   Severity of Illness: Very high due to high scoring CIWA alcohol withdrawal symptoms.  The patient has required multiple doses of lorazepam and will need to remain in the hospital for alcohol detoxification.  Reubin Milan MD [facility]  How to contact the [facility] Attending or Consulting provider [place] or covering provider during after hours [place], for this patient?   1. Check the care team in [facility] and look for a) attending/consulting [facility] provider listed and b) the [facility] team listed 2. Log into [email or web address] and use [place]'s universal password to access. If you do not have the password,  please contact the hospital operator. 3. Locate the [facility] provider you are looking for under [facility] and page to a number that you can be directly reached. 4. If you still have difficulty reaching the provider, please page the [facility] (Director on Call) for the Hospitalists listed on amion for assistance.  03/28/2021, 11:09 PM   This document was prepared in Dragon voice recognition software may contain some unintended transcription errors.

## 2021-03-28 NOTE — ED Notes (Signed)
Abnormal blood gas results- Sherry Baker

## 2021-03-28 NOTE — ED Notes (Signed)
CIWA scored 0 at this time due to patient condition. MD aware.

## 2021-03-28 NOTE — Discharge Instructions (Signed)
Call the number provided to schedule an appointment soon with a normal doctor. We highly suggest you stop drinking alcohol and using cocaine under medical supervision.

## 2021-03-28 NOTE — ED Notes (Addendum)
Unable to give accurate CIWA score at this time due to patient condition. Patient continues to writhe in bed, grunting and yelling out. Does not respond to voice, somewhat to touch. Remains in soft restraints at this time.

## 2021-03-28 NOTE — ED Provider Notes (Signed)
  Physical Exam  BP (!) 142/74   Pulse 100   Temp 97.6 F (36.4 C) (Axillary)   Resp (!) 22   Ht 5\' 2"  (1.575 m)   Wt 46.4 kg   SpO2 98%   BMI 18.71 kg/m   Physical Exam Vitals and nursing note reviewed.  Constitutional:      Appearance: She is well-developed.     Comments: Intermittently alert and intermittently somnolent  HENT:     Head: Normocephalic and atraumatic.  Eyes:     Extraocular Movements: Extraocular movements intact.     Conjunctiva/sclera: Conjunctivae normal.  Cardiovascular:     Rate and Rhythm: Normal rate and regular rhythm.     Heart sounds: No murmur heard.   Pulmonary:     Effort: Pulmonary effort is normal. No respiratory distress.     Breath sounds: Normal breath sounds.  Abdominal:     Palpations: Abdomen is soft.     Tenderness: There is no abdominal tenderness.  Musculoskeletal:     Cervical back: Normal range of motion and neck supple.     Right lower leg: No edema.     Left lower leg: No edema.  Skin:    General: Skin is warm and dry.     Capillary Refill: Capillary refill takes less than 2 seconds.     Comments: Ostomy site clean, normal amount of stool  Neurological:     General: No focal deficit present.     Motor: No weakness.     Comments: GCS [id number] No focal deficit PERRL  Psychiatric:     Comments: UTA     ED Course/Procedures     Procedures  MDM  Presents intoxicated with cocaine and alcohol. Labs otherwise essentially unremarkable. Got ativan here; pending CT head. CXR ordered.   To Do: -F/u CT head -F/u CXR -Reassess for sobriety/intent  -CT head without an acute intracranial abnormality.  Chest x-ray without acute focal infectious process or other emergent process.  Patient became gradually worsening only tachycardic and on bedside evaluation would open her eyes and moaning yell with exam but not answer questions.  She would localize pain but not follow commands.  Reviewed her laboratory studies again and note  that her alcohol level is actually less than 10 contrary to prior interpretation.  Alcohol withdrawals and etiology for symptoms seems most likely at this time.  She received 2 mg of IV Ativan and then 2 more milligrams IV Ativan.  On repeat assessment after that she had HR improvement to 103, still somnolent, normal unlabored respirations. Will need to be admitted for further management. Also discussed dressing her ostomy with her RN. D/w hospitalist who agrees     Aris Lot, MD 03/28/21 2213    Pattricia Boss, MD 03/28/21 [phone]

## 2021-03-28 NOTE — ED Provider Notes (Signed)
[facility] EMERGENCY DEPARTMENT Provider Note   CSN: [id number] Arrival date & time: 03/28/21  1129     History Chief Complaint  Patient presents with  . Altered Mental Status    Pt brought in via [facility] with c/c of AMS. Per EMS pt found in home "full of men, altered, lying on the floor with dried blood". Pt presents with dried blood to left side of face. EMS also states beer cans were all over the house and that everyone else in house was intoxicated.   120/82, 100%, BS 94, 93HR     Sherry Baker is a 55 y.o. female.  HPI 55 year old female with history of substance abuse, bipolar disorder, rectal cancer s/p colostomy, CKD, seizures and hypertension to the presents emergency department for altered mental status.  This has been present for an unknown amount of time.  Notably, patient is altered upon arrival, unable to contribute to history and review of systems.  Per EMS, patient was found altered in the home that they have been called out to multiple times for domestic and substance abuse issues.  States that there were numerous beer can scattered all over the floor.  House was occupied by female occupants who appeared to be intoxicated.  They were not able to contribute well to history.  Past Medical History:  Diagnosis Date  . Alcohol abuse   . Allergy    PCNS swelling  . Arthritis   . Bipolar 1 disorder ([place])   . Cancer ([place]) 01/21/2017   rectal cancer  . Cancer ([place])   . Cirrhosis of liver ([place])   . Depression   . Genetic testing 03/24/2017   Ms. Buttery underwent genetic counseling and testing for hereditary cancer syndromes on 02/17/2017. Her results were negative for mutations in all 46 genes analyzed by [employer]'s 46-gene Common Hereditary Cancers Panel. Genes analyzed include: APC, ATM, AXIN2, BARD1, BMPR1A, BRCA1, BRCA2, BRIP1, CDH1, CDKN2A, CHEK2, CTNNA1, DICER1, EPCAM, GREM1, HOXB13, KIT, MEN1, MLH1, MSH2, MSH3, MSH6, MUTYH, NBN,  . Hypertension      Patient Active Problem List   Diagnosis Date Noted  . Alcohol abuse 06/14/2019  . CKD (chronic kidney disease), stage III ([place]) 06/06/2019  . Normochromic normocytic anemia 06/06/2019  . Pressure injury of skin 06/06/2019  . Substance abuse ([place])   . Seizure ([place]) 05/21/2019  . Acute encephalopathy 05/18/2019  . Acute lower UTI 05/18/2019  . Polysubstance abuse ([place]) 05/18/2019  . Wound dehiscence 07/10/2017  . Genetic testing 03/24/2017  . Hypertension 02/18/2017  . GERD (gastroesophageal reflux disease) 02/18/2017  . Rectal cancer ([place]) 01/27/2017  . Abdominal pain, epigastric   . Rectal mass   . Fever   . Anemia 01/16/2017  . Abdominal pain   . Constipation   . Alcohol abuse   . Tobacco abuse   . Cocaine abuse ([place])   . Bipolar affective disorder ([place])   . Protein-calorie malnutrition, severe ([place]) 02/13/2014  . Suicide ideation 02/12/2014  . Suicidal ideation 02/11/2014  . Alcohol intoxication ([place]) 02/11/2014  . Transaminitis 02/11/2014  . Substance abuse ([place]) 02/11/2014  . Depression 02/11/2014  . Thrombocytopenia ([place]) 02/11/2014    Past Surgical History:  Procedure Laterality Date  . ABDOMINAL PERINEAL BOWEL RESECTION N/A 06/18/2017   Procedure: ABDOMINAL PERINEAL RESECTION ERAS PATHWAY;  Surgeon: Leighton Ruff, MD;  Location: [facility];  Service: General;  Laterality: N/A;  . COLON SURGERY    . COLONOSCOPY WITH PROPOFOL Left 01/21/2017   Procedure: COLONOSCOPY WITH  PROPOFOL;  Surgeon: Otis Brace, MD;  Location: [facility];  Service: Gastroenterology;  Laterality: Left;  . FRACTURE SURGERY    . MANDIBLE FRACTURE SURGERY       OB History   No obstetric history on file.     Family History  Problem Relation Age of Onset  . Cancer Mother 2       Originating in the abdomen, otherwise unknwon  . Cancer - Other Maternal Aunt        Throat    Social History   Tobacco Use  . Smoking status: Current Every Day Smoker    Packs/day: 0.50     Years: 39.00    Pack years: 19.50    Types: Cigarettes  . Smokeless tobacco: Never Used  Vaping Use  . Vaping Use: Never used  Substance Use Topics  . Alcohol use: Yes    Comment: 07/28/2017 "40oz beer/day; if I have it"  . Drug use: Yes    Types: "Crack" cocaine, Cocaine    Comment: 07/28/2017 "nothing in 3 wks"    Home Medications Prior to Admission medications   Medication Sig Start Date End Date Taking? Authorizing Provider  amLODipine (NORVASC) 10 MG tablet Take 1 tablet (10 mg total) by mouth daily. 11/15/20   Argentina Donovan, PA-C  busPIRone (BUSPAR) 15 MG tablet Take 1 tablet (15 mg total) by mouth 2 (two) times daily. 11/15/20   Argentina Donovan, PA-C  cloNIDine (CATAPRES) 0.1 MG tablet Take 1 tablet (0.1 mg total) by mouth 3 (three) times daily. 11/15/20   Argentina Donovan, PA-C  hydrOXYzine (ATARAX/VISTARIL) 10 MG tablet Take 1 tablet (10 mg total) by mouth 3 (three) times daily as needed. As needed for acute anxiety 11/15/20   Argentina Donovan, PA-C  omeprazole (PRILOSEC) 40 MG capsule Take 1 capsule (40 mg total) by mouth daily. 11/15/20   Argentina Donovan, PA-C  ziprasidone (GEODON) 40 MG capsule Take 1 capsule (40 mg total) by mouth 2 (two) times daily with a meal. 11/06/11 01/09/12  Daleen Bo, MD    Allergies    Penicillins and Penicillins  Review of Systems   Review of Systems  Unable to perform ROS: Mental status change    Physical Exam Updated Vital Signs BP 117/74   Pulse (!) 101   Temp 97.6 F (36.4 C) (Axillary)   Resp 20   Ht '5\' 2"'  (1.575 m)   Wt 46.4 kg   SpO2 90%   BMI 18.71 kg/m   Physical Exam Vitals and nursing note reviewed. Exam conducted with a chaperone present.  Constitutional:      Comments: Eyes closed, but makes incoherent sounds/yells whenever slightly stimulated. Covered in stool and urine.  HENT:     Head: Normocephalic.     Comments: 1 cm abrasion above left eyebrow, hemostatic and scabbed, dried blood down forehead to left  medial canthus    Right Ear: External ear normal.     Left Ear: External ear normal.     Nose: Nose normal.  Eyes:     Extraocular Movements: Extraocular movements intact.     Pupils: Pupils are equal, round, and reactive to light.     Comments: Pupils 4 mm bilaterally  Neck:     Comments: Moving neck spontaneously Cardiovascular:     Rate and Rhythm: Normal rate and regular rhythm.     Heart sounds: Normal heart sounds.  Pulmonary:     Breath sounds: Rhonchi present.  Abdominal:  General: Abdomen is flat.     Palpations: Abdomen is soft.     Comments: Ostomy site present  Musculoskeletal:     Cervical back: Normal range of motion.     Right lower leg: No edema.     Left lower leg: No edema.     Comments: Scattered abrasions and excoriations down bilateral legs.  Old appearing ecchymosis on knees.  Skin:    General: Skin is warm.     Capillary Refill: Capillary refill takes less than 2 seconds.  Neurological:     GCS: GCS eye subscore is 3. GCS verbal subscore is 2. GCS motor subscore is 5.     Comments: Unable to contribute with history and review of systems.  Is moving all extremities spontaneously.  Intermittently hollers incoherently.  No facial droop.     ED Results / Procedures / Treatments   Labs (all labs ordered are listed, but only abnormal results are displayed) Labs Reviewed  CBC WITH DIFFERENTIAL/PLATELET - Abnormal; Notable for the following components:      Result Value   Hemoglobin 11.8 (*)    Lymphs Abs 0.4 (*)    All other components within normal limits  COMPREHENSIVE METABOLIC PANEL - Abnormal; Notable for the following components:   CO2 16 (*)    BUN 47 (*)    Creatinine, Ser 1.68 (*)    Total Protein 8.5 (*)    GFR, Estimated 36 (*)    Anion gap 17 (*)    All other components within normal limits  RAPID URINE DRUG SCREEN, HOSP PERFORMED - Abnormal; Notable for the following components:   Cocaine POSITIVE (*)    All other components within  normal limits  SALICYLATE LEVEL - Abnormal; Notable for the following components:   Salicylate Lvl <5.1 (*)    All other components within normal limits  ACETAMINOPHEN LEVEL - Abnormal; Notable for the following components:   Acetaminophen (Tylenol), Serum <10 (*)    All other components within normal limits  URINALYSIS, ROUTINE W REFLEX MICROSCOPIC - Abnormal; Notable for the following components:   Color, Urine STRAW (*)    Ketones, ur 20 (*)    Protein, ur 30 (*)    All other components within normal limits  RESP PANEL BY RT-PCR (FLU A&B, COVID) ARPGX2  AMMONIA  ETHANOL  CBC WITH DIFFERENTIAL/PLATELET    EKG None  Radiology CT Head Wo Contrast  Result Date: 03/28/2021 CLINICAL DATA:  55 year old female with altered mental status. EXAM: CT HEAD WITHOUT CONTRAST TECHNIQUE: Contiguous axial images were obtained from the base of the skull through the vertex without intravenous contrast. COMPARISON:  Head CT dated 04/07/2020. FINDINGS: Brain: The ventricles and sulci are appropriate size for patient's age. The gray-white matter discrimination is preserved. There is no acute intracranial hemorrhage. No mass effect or midline shift. No extra-axial fluid collection. Vascular: No hyperdense vessel or unexpected calcification. Skull: Normal. Negative for fracture or focal lesion. Sinuses/Orbits: There is diffuse mucoperiosteal thickening of paranasal sinuses with near complete opacification of the right maxillary sinus. The mastoid air cells are clear. Cerumen noted in the right external auditory canal. Other: None IMPRESSION: 1. No acute intracranial pathology. 2. Paranasal sinus disease. Electronically Signed   By: Anner Crete M.D.   On: 03/28/2021 15:37    Procedures Procedures   Medications Ordered in ED Medications  lactated ringers bolus 1,000 mL (0 mLs Intravenous Stopped 03/28/21 1501)  LORazepam (ATIVAN) injection 2 mg (2 mg Intravenous Given 03/28/21 1409)  thiamine (B-1)  injection 100 mg (100 mg Intravenous Given 03/28/21 1500)  lactated ringers bolus 1,000 mL (1,000 mLs Intravenous New Bag/Given 03/28/21 1539)    ED Course  I have reviewed the triage vital signs and the nursing notes.  Pertinent labs & imaging results that were available during my care of the patient were reviewed by me and considered in my medical decision making (see chart for details).    MDM Rules/Calculators/A&P                          55 year old female presents emergency department for altered mental status.  Vital signs are stable.  She is not in acute distress, but does intermittently yell.  Exam shows no focal neurologic deficits, but patient is significantly altered with GCS of 9.  Unable to participate in full exam.  Does not have any signs of nuchal rigidity as she is moving her neck spontaneously.  Abdomen without signs of peritonitis.  No obvious fracture or dislocation.  She is moving all joints spontaneously, low suspicion for pain there.  No midline spinal step-offs or deformities.  Differential is very broad.  Ingestion being highest on my differential due to patient's history of substance abuse and General appearance.  Will obtain toxic work-up.  Differential also includes: Seizure, intracranial hemorrhage, stroke, hepatic encephalopathy, uremia  Will obtain labs, imaging, and EKG to further stratify and evaluate patient.  Fluid bolus ordered to help with metabolism.  UDS positive for cocaine and ethanol significantly elevated.  Presentation most consistent with acute intoxication.  Creatinine 1.68, 1 year ago was 1.66-though has been as low as 1.15 in the last year.  Fluids given for possible AKI.  Metabolic panel does show anion gap metabolic acidosis consistent with alcoholic ketoacidosis.  Urinalysis does show ketones without UTI.  No elevation of acetaminophen or salicylate level.  Upon reevaluation, patient is still significantly agitated.  Ativan given for  agitation and delirium.  Plan time of handoff is to follow-up CT chest x-ray.  Will need to be observed for metabolism of substances until clinically sober.  Handed off to Dr. Tyrone Apple at 3:30 PM.  Final Clinical Impression(s) / ED Diagnoses Final diagnoses:  Transient alteration of awareness  Cocaine intoxication with delirium [facility])  Alcohol abuse    Rx / DC Orders ED Discharge Orders    None       Suzan Nailer, DO 03/28/21 1542    Carmin Muskrat, MD 04/02/21 1650

## 2021-03-28 NOTE — ED Notes (Signed)
ETOH withdrawal and CIWA protocol initiated.

## 2021-03-28 NOTE — ED Notes (Signed)
Still unable to obtain accurate CIWA score due patient condition. However, vital signs appear much more stable at this time. Patient no longer yelling and groaning out loud.

## 2021-03-29 ENCOUNTER — Encounter (HOSPITAL_COMMUNITY): Payer: Self-pay | Admitting: Internal Medicine

## 2021-03-29 DIAGNOSIS — F319 Bipolar disorder, unspecified: Secondary | ICD-10-CM | POA: Diagnosis present

## 2021-03-29 DIAGNOSIS — F10231 Alcohol dependence with withdrawal delirium: Secondary | ICD-10-CM

## 2021-03-29 DIAGNOSIS — K7460 Unspecified cirrhosis of liver: Secondary | ICD-10-CM | POA: Diagnosis present

## 2021-03-29 DIAGNOSIS — Y929 Unspecified place or not applicable: Secondary | ICD-10-CM | POA: Diagnosis not present

## 2021-03-29 DIAGNOSIS — I10 Essential (primary) hypertension: Secondary | ICD-10-CM | POA: Diagnosis not present

## 2021-03-29 DIAGNOSIS — I129 Hypertensive chronic kidney disease with stage 1 through stage 4 chronic kidney disease, or unspecified chronic kidney disease: Secondary | ICD-10-CM | POA: Diagnosis present

## 2021-03-29 DIAGNOSIS — G9340 Encephalopathy, unspecified: Secondary | ICD-10-CM | POA: Diagnosis not present

## 2021-03-29 DIAGNOSIS — D649 Anemia, unspecified: Secondary | ICD-10-CM | POA: Diagnosis present

## 2021-03-29 DIAGNOSIS — E43 Unspecified severe protein-calorie malnutrition: Secondary | ICD-10-CM | POA: Diagnosis present

## 2021-03-29 DIAGNOSIS — R64 Cachexia: Secondary | ICD-10-CM | POA: Diagnosis present

## 2021-03-29 DIAGNOSIS — Z681 Body mass index (BMI) 19 or less, adult: Secondary | ICD-10-CM | POA: Diagnosis not present

## 2021-03-29 DIAGNOSIS — N1831 Chronic kidney disease, stage 3a: Secondary | ICD-10-CM

## 2021-03-29 DIAGNOSIS — F419 Anxiety disorder, unspecified: Secondary | ICD-10-CM | POA: Diagnosis present

## 2021-03-29 DIAGNOSIS — J13 Pneumonia due to Streptococcus pneumoniae: Secondary | ICD-10-CM | POA: Diagnosis not present

## 2021-03-29 DIAGNOSIS — G928 Other toxic encephalopathy: Secondary | ICD-10-CM | POA: Diagnosis present

## 2021-03-29 DIAGNOSIS — J690 Pneumonitis due to inhalation of food and vomit: Secondary | ICD-10-CM | POA: Diagnosis not present

## 2021-03-29 DIAGNOSIS — Z20822 Contact with and (suspected) exposure to covid-19: Secondary | ICD-10-CM | POA: Diagnosis present

## 2021-03-29 DIAGNOSIS — F10239 Alcohol dependence with withdrawal, unspecified: Secondary | ICD-10-CM | POA: Diagnosis present

## 2021-03-29 DIAGNOSIS — Z880 Allergy status to penicillin: Secondary | ICD-10-CM | POA: Diagnosis not present

## 2021-03-29 DIAGNOSIS — E876 Hypokalemia: Secondary | ICD-10-CM | POA: Diagnosis present

## 2021-03-29 DIAGNOSIS — C20 Malignant neoplasm of rectum: Secondary | ICD-10-CM

## 2021-03-29 DIAGNOSIS — F1410 Cocaine abuse, uncomplicated: Secondary | ICD-10-CM | POA: Diagnosis not present

## 2021-03-29 DIAGNOSIS — J9601 Acute respiratory failure with hypoxia: Secondary | ICD-10-CM | POA: Diagnosis present

## 2021-03-29 DIAGNOSIS — F14121 Cocaine abuse with intoxication with delirium: Secondary | ICD-10-CM | POA: Diagnosis present

## 2021-03-29 DIAGNOSIS — F14921 Cocaine use, unspecified with intoxication delirium: Secondary | ICD-10-CM | POA: Diagnosis not present

## 2021-03-29 DIAGNOSIS — Z85048 Personal history of other malignant neoplasm of rectum, rectosigmoid junction, and anus: Secondary | ICD-10-CM | POA: Diagnosis not present

## 2021-03-29 DIAGNOSIS — J15211 Pneumonia due to Methicillin susceptible Staphylococcus aureus: Secondary | ICD-10-CM | POA: Diagnosis present

## 2021-03-29 DIAGNOSIS — F1010 Alcohol abuse, uncomplicated: Secondary | ICD-10-CM | POA: Diagnosis not present

## 2021-03-29 DIAGNOSIS — Y900 Blood alcohol level of less than 20 mg/100 ml: Secondary | ICD-10-CM | POA: Diagnosis present

## 2021-03-29 DIAGNOSIS — K219 Gastro-esophageal reflux disease without esophagitis: Secondary | ICD-10-CM | POA: Diagnosis present

## 2021-03-29 DIAGNOSIS — F17210 Nicotine dependence, cigarettes, uncomplicated: Secondary | ICD-10-CM | POA: Diagnosis present

## 2021-03-29 DIAGNOSIS — N179 Acute kidney failure, unspecified: Secondary | ICD-10-CM | POA: Diagnosis present

## 2021-03-29 DIAGNOSIS — Z933 Colostomy status: Secondary | ICD-10-CM | POA: Diagnosis not present

## 2021-03-29 DIAGNOSIS — R4182 Altered mental status, unspecified: Secondary | ICD-10-CM | POA: Diagnosis not present

## 2021-03-29 LAB — COMPREHENSIVE METABOLIC PANEL
ALT: 20 U/L (ref 0–44)
AST: 22 U/L (ref 15–41)
Albumin: 3.1 g/dL — ABNORMAL LOW (ref 3.5–5.0)
Alkaline Phosphatase: 60 U/L (ref 38–126)
Anion gap: 11 (ref 5–15)
BUN: 37 mg/dL — ABNORMAL HIGH (ref 6–20)
CO2: 19 mmol/L — ABNORMAL LOW (ref 22–32)
Calcium: 9.3 mg/dL (ref 8.9–10.3)
Chloride: 107 mmol/L (ref 98–111)
Creatinine, Ser: 1.62 mg/dL — ABNORMAL HIGH (ref 0.44–1.00)
GFR, Estimated: 38 mL/min — ABNORMAL LOW (ref >60)
Glucose, Bld: 72 mg/dL (ref 70–99)
Potassium: 3.6 mmol/L (ref 3.5–5.1)
Sodium: 137 mmol/L (ref 135–145)
Total Bilirubin: 1 mg/dL (ref 0.3–1.2)
Total Protein: 6.7 g/dL (ref 6.5–8.1)

## 2021-03-29 LAB — CBC
HCT: 28.7 % — ABNORMAL LOW (ref 36.0–46.0)
Hemoglobin: 9.4 g/dL — ABNORMAL LOW (ref 12.0–15.0)
MCH: 30.3 pg (ref 26.0–34.0)
MCHC: 32.8 g/dL (ref 30.0–36.0)
MCV: 92.6 fL (ref 80.0–100.0)
Platelets: 198 10*3/uL (ref 150–400)
RBC: 3.1 MIL/uL — ABNORMAL LOW (ref 3.87–5.11)
RDW: 13.5 % (ref 11.5–15.5)
WBC: 7.9 10*3/uL (ref 4.0–10.5)
nRBC: 0 % (ref 0.0–0.2)

## 2021-03-29 LAB — GLUCOSE, CAPILLARY
Glucose-Capillary: 101 mg/dL — ABNORMAL HIGH (ref 70–99)
Glucose-Capillary: 130 mg/dL — ABNORMAL HIGH (ref 70–99)

## 2021-03-29 LAB — BLOOD GAS, VENOUS
Acid-base deficit: 6.2 mmol/L — ABNORMAL HIGH (ref 0.0–2.0)
Bicarbonate: 18.6 mmol/L — ABNORMAL LOW (ref 20.0–28.0)
FIO2: 32
O2 Saturation: 52.2 %
Patient temperature: 37
pCO2, Ven: 36 mmHg — ABNORMAL LOW (ref 44.0–60.0)
pH, Ven: 7.334 (ref 7.250–7.430)
pO2, Ven: 31.5 mmHg — CL (ref 32.0–45.0)

## 2021-03-29 LAB — CK: Total CK: 219 U/L (ref 38–234)

## 2021-03-29 LAB — MAGNESIUM: Magnesium: 2 mg/dL (ref 1.7–2.4)

## 2021-03-29 LAB — HIV ANTIBODY (ROUTINE TESTING W REFLEX): HIV Screen 4th Generation wRfx: NONREACTIVE

## 2021-03-29 LAB — AMMONIA: Ammonia: 23 umol/L (ref 9–35)

## 2021-03-29 LAB — PHOSPHORUS: Phosphorus: 3.5 mg/dL (ref 2.5–4.6)

## 2021-03-29 MED ORDER — POTASSIUM CHLORIDE 2 MEQ/ML IV SOLN
INTRAVENOUS | Status: DC
  Filled 2021-03-29 (×3): qty 1000

## 2021-03-29 MED ORDER — PANTOPRAZOLE SODIUM 40 MG PO TBEC: 40.0000 mg | DELAYED_RELEASE_TABLET | Freq: Every day | ORAL | Status: DC

## 2021-03-29 MED ORDER — SODIUM CHLORIDE 0.9 % IV SOLN: 1.0000 mg | Freq: Once | INTRAVENOUS | Status: DC

## 2021-03-29 MED ORDER — CHLORHEXIDINE GLUCONATE CLOTH 2 % EX PADS
6.0000 | MEDICATED_PAD | Freq: Every day | CUTANEOUS | Status: DC
  Administered 2021-03-29 – 2021-04-08 (×10): 6 via TOPICAL

## 2021-03-29 MED ORDER — FOLIC ACID 5 MG/ML IJ SOLN
1.0000 mg | Freq: Once | INTRAMUSCULAR | Status: AC
  Administered 2021-03-29: 1 mg via INTRAVENOUS
  Filled 2021-03-29: qty 0.2

## 2021-03-29 MED ORDER — LORAZEPAM 2 MG/ML IJ SOLN
2.0000 mg | INTRAMUSCULAR | Status: DC | PRN
  Administered 2021-03-30 – 2021-04-03 (×5): 2 mg via INTRAVENOUS
  Filled 2021-03-29 (×5): qty 1

## 2021-03-29 MED ORDER — ALBUTEROL SULFATE (2.5 MG/3ML) 0.083% IN NEBU
2.5000 mg | INHALATION_SOLUTION | RESPIRATORY_TRACT | Status: DC | PRN
  Administered 2021-03-30: 2.5 mg via RESPIRATORY_TRACT
  Filled 2021-03-29: qty 3

## 2021-03-29 MED ORDER — LORAZEPAM 2 MG/ML IJ SOLN: 1.0000 mg | INTRAMUSCULAR | Status: DC | PRN

## 2021-03-29 MED ORDER — ORAL CARE MOUTH RINSE
15.0000 mL | Freq: Two times a day (BID) | OROMUCOSAL | Status: DC
  Administered 2021-03-29 – 2021-03-30 (×2): 15 mL via OROMUCOSAL

## 2021-03-29 MED ORDER — WHITE PETROLATUM EX OINT
TOPICAL_OINTMENT | CUTANEOUS | Status: AC
  Filled 2021-03-29: qty 28.35

## 2021-03-29 MED ORDER — THIAMINE HCL 100 MG/ML IJ SOLN
500.0000 mg | Freq: Three times a day (TID) | INTRAVENOUS | Status: AC
  Administered 2021-03-29 – 2021-04-02 (×15): 500 mg via INTRAVENOUS
  Filled 2021-03-29 (×15): qty 5

## 2021-03-29 MED ORDER — AMLODIPINE BESYLATE 10 MG PO TABS
10.0000 mg | ORAL_TABLET | Freq: Every day | ORAL | Status: DC
Start: 2021-03-29 — End: 2021-03-31

## 2021-03-29 MED ORDER — HEPARIN SODIUM (PORCINE) 5000 UNIT/ML IJ SOLN
5000.0000 [IU] | Freq: Three times a day (TID) | INTRAMUSCULAR | Status: DC
  Administered 2021-03-29 – 2021-04-08 (×29): 5000 [IU] via SUBCUTANEOUS
  Filled 2021-03-29 (×30): qty 1

## 2021-03-29 MED ORDER — FENTANYL CITRATE (PF) 100 MCG/2ML IJ SOLN: 25.0000 ug | INTRAMUSCULAR | Status: DC | PRN

## 2021-03-29 MED ORDER — DEXMEDETOMIDINE HCL IN NACL 400 MCG/100ML IV SOLN
0.4000 ug/kg/h | INTRAVENOUS | Status: DC
  Administered 2021-03-29: 0.2 ug/kg/h via INTRAVENOUS
  Administered 2021-03-30: 0.4 ug/kg/h via INTRAVENOUS
  Administered 2021-03-30: 0.7 ug/kg/h via INTRAVENOUS
  Filled 2021-03-29 (×2): qty 100

## 2021-03-29 MED ORDER — FENTANYL CITRATE (PF) 100 MCG/2ML IJ SOLN
25.0000 ug | INTRAMUSCULAR | Status: DC | PRN
  Administered 2021-03-30 – 2021-04-04 (×12): 25 ug via INTRAVENOUS
  Filled 2021-03-29 (×13): qty 2

## 2021-03-29 MED ORDER — DEXMEDETOMIDINE HCL IN NACL 400 MCG/100ML IV SOLN
0.0000 ug/kg/h | INTRAVENOUS | Status: DC
  Filled 2021-03-29: qty 100

## 2021-03-29 NOTE — ED Notes (Addendum)
Pt yelling, disoriented unable to follow simple directions, agitated, restless, pulling at equipment, this RN d/c soft restraints and placed mitts on pt, HR 135 BPM.  Ativan given per CIWA Protocol, secretary to page attending,

## 2021-03-29 NOTE — ED Notes (Signed)
Attempted to call nursing report to 5w 

## 2021-03-29 NOTE — ED Notes (Addendum)
This RN spoke with attending and notified him of pt presentation; reports will be making rounds on pt.

## 2021-03-29 NOTE — ED Notes (Signed)
Admitting paged to RN per her request 

## 2021-03-29 NOTE — Progress Notes (Addendum)
Sent note to Dr. Sloan Leiter:  Patient is still extremely agitated and tachy. She is sustaining in the 140's. She has had 24 mg of Ativan since arriving on the floor at 10:30 am this morning. Her CIWA scores are 24. Please advise, thank you.   Per Dr. Nena Alexander response, critical care is coming to assess the patient.

## 2021-03-29 NOTE — Progress Notes (Signed)
PROGRESS NOTE    Sherry Baker  JSH:[id number] DOB: 1965-12-15 DOA: 03/28/2021 PCP: Default, Provider, MD    Brief Narrative:  55 year old female with history of alcohol and cocaine abuse, bipolar disorder, rectal cancer with history of abdominal peritoneal bowel resection and diverting colostomy, cirrhosis of liver brought to the ER by EMS due to altered mental status.  According to EMS, she was found altered confused in the house with multiple other people, all intoxicated and in a filthy living condition.  Plenty of alcoholic beverages laying around.  Apparently, patient was just released from prison this week.  Patient was not able to provide any information. In the emergency room afebrile.  Confused.  Given symptomatic treatment and admitted.  UDS positive for cocaine.  Ammonia normal.  Chest x-ray and CT head normal.   Assessment & Plan:   Principal Problem:   Alcohol withdrawal (HCC) Active Problems:   Cocaine abuse (HCC)   Rectal cancer (HCC)   Hypertension   GERD (gastroesophageal reflux disease)   CKD (chronic kidney disease), stage III (HCC)   Normochromic normocytic anemia  Acute metabolic encephalopathy secondary to suspected severe alcohol withdrawal/delirium tremens/alcohol and drug use disorder: No focal deficit.  Symptomatic treatment. Very high risk of alcohol withdrawal and delirium tremens, continue on high-dose benzodiazepine with CIWA scale. Patient with risk of Warnicke's encephalopathy, will treat with 5 days of high-dose thiamine 500 mg 3 times a day. Fall precautions.  Seizure precautions.  Aggressive electrolyte replacement. Very poor living condition, Education officer, museum to evaluate.  Does not have any relatives.  Apparently lives in a boardinghouse and drinks alcohol and uses cocaine. May not be safe to go back to the living situation. Unable to wake up meaningfully, will keep NPO.  Speech swallow evaluation.  Maintenance IV fluid with potassium  replacements.  Essential hypertension: Blood pressures fairly stable.  Resume amlodipine.  History of rectal cancer with colostomy: Unlikely recurrence.  Colostomy care.  Social: Patient is altered.  Poor clinical status.  No family or legal guardian available.  Social worker to explore.  Hopefully she wakes up and able to give [place] more information.     DVT prophylaxis: SCDs Start: 03/28/21 2204   Code Status: Full code Family Communication: None Disposition Plan: Status is: Observation  The patient will require care spanning > 2 midnights and should be moved to inpatient because: IV treatments appropriate due to intensity of illness or inability to take PO and Inpatient level of care appropriate due to severity of illness  Dispo: The patient is from: Home              Anticipated d/c is to: Unknown              Patient currently is not medically stable to d/c.   Difficult to place patient No         Consultants:   None  Procedures:   None  Antimicrobials:   None   Subjective: Patient seen and examined.  I was called by nurse to see patient in the emergency room with her agitation and yelling.  I went to examine the patient and patient was a snoring after receiving 4 mg of Ativan.  She was tachycardic and tachypneic consistent with alcohol withdrawal. Patient did not wake up, moaning on stimulation.  Objective: Vitals:   03/29/21 1004 03/29/21 1041 03/29/21 1100 03/29/21 1300  BP:  138/66 (!) 103/55   Pulse:  99 87 (!) 136  Resp:  (!) 21 19  Temp: (!) 97.3 F (36.3 C) 98 F (36.7 C) 97.8 F (36.6 C)   TempSrc: Axillary Axillary Axillary   SpO2:  96% 97%   Weight:      Height:        Intake/Output Summary (Last 24 hours) at 03/29/2021 1404 Last data filed at 03/28/2021 1501 Gross per 24 hour  Intake 1000 ml  Output --  Net 1000 ml   Filed Weights   03/28/21 1216  Weight: 46.4 kg    Examination:  General exam: Chronically sick looking.  Frail  and debilitated.  On 2 to 3 L of oxygen. Patient has periorbital edema, multiple ecchymosis. Respiratory system: Clear to auscultation. Respiratory effort normal.  Mostly conducted airway sounds. Cardiovascular system: S1 & S2 heard, RRR.  Tachycardic. Gastrointestinal system: Soft.  Nontender.  Colostomy present with loose brown stool. Central nervous system: Sleepy.  Lethargic.  Delirious on his stimulation. Extremities: Moves all extremities.    Data Reviewed: I have personally reviewed following labs and imaging studies  CBC: Recent Labs  Lab 03/28/21 1317 03/28/21 2320 03/28/21 2352  WBC 5.9 7.9  --   NEUTROABS 5.2  --   --   HGB 11.8* 9.4* 9.5*  HCT 36.0 28.7* 28.0*  MCV 91.8 92.6  --   PLT 244 198  --    Basic Metabolic Panel: Recent Labs  Lab 03/28/21 1317 03/28/21 2320 03/28/21 2352  NA 135 137 139  K 5.0 3.6 3.7  CL 102 107  --   CO2 16* 19*  --   GLUCOSE 77 72  --   BUN 47* 37*  --   CREATININE 1.68* 1.62*  --   CALCIUM 9.8 9.3  --   MG  --  2.0  --   PHOS  --  3.5  --    GFR: Estimated Creatinine Clearance: 29.4 mL/min (A) (by C-G formula based on SCr of 1.62 mg/dL (H)). Liver Function Tests: Recent Labs  Lab 03/28/21 1317 03/28/21 2320  AST 27 22  ALT 25 20  ALKPHOS 89 60  BILITOT 1.2 1.0  PROT 8.5* 6.7  ALBUMIN 3.9 3.1*   No results for input(s): LIPASE, AMYLASE in the last 168 hours. Recent Labs  Lab 03/28/21 1317  AMMONIA 19   Coagulation Profile: No results for input(s): INR, PROTIME in the last 168 hours. Cardiac Enzymes: Recent Labs  Lab 03/28/21 1551  CKTOTAL 205   BNP (last 3 results) No results for input(s): PROBNP in the last 8760 hours. HbA1C: No results for input(s): HGBA1C in the last 72 hours. CBG: Recent Labs  Lab 03/28/21 1847  GLUCAP 71   Lipid Profile: No results for input(s): CHOL, HDL, LDLCALC, TRIG, CHOLHDL, LDLDIRECT in the last 72 hours. Thyroid Function Tests: No results for input(s): TSH, T4TOTAL,  FREET4, T3FREE, THYROIDAB in the last 72 hours. Anemia Panel: No results for input(s): VITAMINB12, FOLATE, FERRITIN, TIBC, IRON, RETICCTPCT in the last 72 hours. Sepsis Labs: No results for input(s): PROCALCITON, LATICACIDVEN in the last 168 hours.  Recent Results (from the past 240 hour(s))  Resp Panel by RT-PCR (Flu A&B, Covid) Nasopharyngeal Swab     Status: None   Collection Time: 03/28/21 11:42 AM   Specimen: Nasopharyngeal Swab; Nasopharyngeal(NP) swabs in vial transport medium  Result Value Ref Range Status   SARS Coronavirus 2 by RT PCR NEGATIVE NEGATIVE Final    Comment: (NOTE) SARS-CoV-2 target nucleic acids are NOT DETECTED.  The SARS-CoV-2 RNA is generally detectable in upper respiratory specimens  during the acute phase of infection. The lowest concentration of SARS-CoV-2 viral copies this assay can detect is 138 copies/mL. A negative result does not preclude SARS-Cov-2 infection and should not be used as the sole basis for treatment or other patient management decisions. A negative result may occur with  improper specimen collection/handling, submission of specimen other than nasopharyngeal swab, presence of viral mutation(s) within the areas targeted by this assay, and inadequate number of viral copies(<138 copies/mL). A negative result must be combined with clinical observations, patient history, and epidemiological information. The expected result is Negative.  Fact Sheet for Patients:  EntrepreneurPulse.com.au  Fact Sheet for Healthcare Providers:  IncredibleEmployment.be  This test is no t yet approved or cleared by the [place] FDA and  has been authorized for detection and/or diagnosis of SARS-CoV-2 by FDA under an Emergency Use Authorization (EUA). This EUA will remain  in effect (meaning this test can be used) for the duration of the COVID-19 declaration under Section 564(b)(1) of the Act, 21 U.S.C.section  360bbb-3(b)(1), unless the authorization is terminated  or revoked sooner.       Influenza A by PCR NEGATIVE NEGATIVE Final   Influenza B by PCR NEGATIVE NEGATIVE Final    Comment: (NOTE) The Xpert Xpress SARS-CoV-2/FLU/RSV plus assay is intended as an aid in the diagnosis of influenza from Nasopharyngeal swab specimens and should not be used as a sole basis for treatment. Nasal washings and aspirates are unacceptable for Xpert Xpress SARS-CoV-2/FLU/RSV testing.  Fact Sheet for Patients: EntrepreneurPulse.com.au  Fact Sheet for Healthcare Providers: IncredibleEmployment.be  This test is not yet approved or cleared by the [place] FDA and has been authorized for detection and/or diagnosis of SARS-CoV-2 by FDA under an Emergency Use Authorization (EUA). This EUA will remain in effect (meaning this test can be used) for the duration of the COVID-19 declaration under Section 564(b)(1) of the Act, 21 U.S.C. section 360bbb-3(b)(1), unless the authorization is terminated or revoked.  Performed at [facility], [place] [address] [place], [place] [address]          Radiology Studies: CT Head Wo Contrast  Result Date: 03/28/2021 CLINICAL DATA:  55 year old female with altered mental status. EXAM: CT HEAD WITHOUT CONTRAST TECHNIQUE: Contiguous axial images were obtained from the base of the skull through the vertex without intravenous contrast. COMPARISON:  Head CT dated 04/07/2020. FINDINGS: Brain: The ventricles and sulci are appropriate size for patient's age. The gray-white matter discrimination is preserved. There is no acute intracranial hemorrhage. No mass effect or midline shift. No extra-axial fluid collection. Vascular: No hyperdense vessel or unexpected calcification. Skull: Normal. Negative for fracture or focal lesion. Sinuses/Orbits: There is diffuse mucoperiosteal thickening of paranasal sinuses with near complete opacification  of the right maxillary sinus. The mastoid air cells are clear. Cerumen noted in the right external auditory canal. Other: None IMPRESSION: 1. No acute intracranial pathology. 2. Paranasal sinus disease. Electronically Signed   By: Anner Crete M.D.   On: 03/28/2021 15:37   DG Chest Portable 1 View  Result Date: 03/28/2021 CLINICAL DATA:  Rhonchi altered EXAM: PORTABLE CHEST 1 VIEW COMPARISON:  05/26/2020 FINDINGS: Slight asymmetric opacity in right thorax probably related to positioning and rotation. No focal airspace disease or pleural effusion. Mild bronchitic changes. Normal cardiomediastinal silhouette. No pneumothorax. IMPRESSION: No active disease allowing for rotation and patient positioning. Electronically Signed   By: Donavan Foil M.D.   On: 03/28/2021 16:27        Scheduled Meds: .  amLODipine  10 mg Oral Daily  . folic acid  1 mg Oral Daily  . LORazepam  0-4 mg Intravenous Q4H   Followed by  . [START ON 03/30/2021] LORazepam  0-4 mg Intravenous Q8H  . multivitamin with minerals  1 tablet Oral Daily  . pantoprazole  40 mg Oral Daily   Continuous Infusions: . dextrose 5 % lactated ringers with kcl    . thiamine injection       LOS: 0 days    Time spent: 35 minutes    Barb Merino, MD [facility] Pager [phone]

## 2021-03-29 NOTE — TOC Initial Note (Signed)
Transition of Care [facility]) - Initial/Assessment Note    Patient Details  Name: Sherry Baker MRN: [id number] Date of Birth: Dec 31, 1965  Transition of Care [facility]) CM/SW Contact:    Verdell Carmine, RN Phone Number: 03/29/2021, 2:11 PM  Clinical Narrative:                 Patient admitted for [facility] Has had multiple admissions. Just got out of jail. She has a "Step-gransmother" that tries to help her, but she has no other family according to this person. She cannot come live with this individual.  Patient has colostomy, has been difficult to place in past. May need to lok at guardianship. Soon, if patient is having aloc and infusion Currently does not have a known address. She was at a house with a bunch of men when picked up by EMS, according to EMS they all appeared to be intoxicated. .   Expected Discharge Plan: Home/Self Care Barriers to Discharge: Homeless with medical needs   Patient Goals and CMS Choice        Expected Discharge Plan and Services Expected Discharge Plan: Home/Self Care       Living arrangements for the past 2 months: Homeless                                      Prior Living Arrangements/Services Living arrangements for the past 2 months: Homeless Lives with:: Self Patient language and need for interpreter reviewed:: Yes        Need for Family Participation in Patient Care: Yes (Comment) Care giver support system in place?: Yes (comment)   Criminal Activity/Legal Involvement Pertinent to Current Situation/Hospitalization: No - Comment as needed  Activities of Daily Living      Permission Sought/Granted                  Emotional Assessment     Affect (typically observed): Unable to Assess Orientation: : Fluctuating Orientation (Suspected and/or reported Sundowners) Alcohol / Substance Use: Illicit Drugs,Alcohol Use Psych Involvement: No (comment)  Admission diagnosis:  Alcohol withdrawal ([place]) [F10.239] Transient alteration of  awareness [R40.4] Alcohol abuse [F10.10] Cocaine intoxication with delirium [facility]) [L79.892] Patient Active Problem List   Diagnosis Date Noted  . Alcohol withdrawal ([place]) 03/28/2021  . Alcohol abuse 06/14/2019  . CKD (chronic kidney disease), stage III ([place]) 06/06/2019  . Normochromic normocytic anemia 06/06/2019  . Pressure injury of skin 06/06/2019  . Substance abuse ([place])   . Seizure ([place]) 05/21/2019  . Acute encephalopathy 05/18/2019  . Acute lower UTI 05/18/2019  . Polysubstance abuse ([place]) 05/18/2019  . Wound dehiscence 07/10/2017  . Genetic testing 03/24/2017  . Hypertension 02/18/2017  . GERD (gastroesophageal reflux disease) 02/18/2017  . Rectal cancer ([place]) 01/27/2017  . Abdominal pain, epigastric   . Rectal mass   . Fever   . Anemia 01/16/2017  . Abdominal pain   . Constipation   . Alcohol abuse   . Tobacco abuse   . Cocaine abuse ([place])   . Bipolar affective disorder ([place])   . Protein-calorie malnutrition, severe ([place]) 02/13/2014  . Suicide ideation 02/12/2014  . Suicidal ideation 02/11/2014  . Alcohol intoxication ([place]) 02/11/2014  . Transaminitis 02/11/2014  . Substance abuse ([place]) 02/11/2014  . Depression 02/11/2014  . Thrombocytopenia ([place]) 02/11/2014   PCP:  Default, Provider, MD Pharmacy:   [facility] [place], [place] - [address] [place] [address] Phone: [phone] Fax: [phone]     Social Determinants of Health (SDOH) Interventions    Readmission Risk Interventions No flowsheet data found.

## 2021-03-29 NOTE — Progress Notes (Signed)
Date and time results received: 03/29/21 1116   Test: pO2 Critical Value: 31.5  Name of Provider Notified: Dr. Raelyn Mora notified

## 2021-03-29 NOTE — ED Notes (Signed)
Patient HR shot up and began to writhe in bed. Patient began groaning again. 2mg  Ativan given at this time. Will continue to monitor.

## 2021-03-29 NOTE — Consult Note (Signed)
NAME:  Sherry Baker, MRN:  [id number], DOB:  10-22-1966, LOS: 0 ADMISSION DATE:  03/28/2021, CONSULTATION DATE: 03/29/2021 REFERRING MD: Dr. Sloan Leiter, CHIEF COMPLAINT: Alcohol withdrawal polysubstance abuse  History of Present Illness:  This is a 55 year old female, past medical history of polysubstance abuse, alcohol abuse, cocaine abuse, bipolar disease, arthritis, rectal colon cancer status post colostomy.  Patient with a history of cirrhosis.  Patient was found altered unresponsive lying in the floor, to have blood other female people present in the home.  This was concern for known high risk area of illicit activity.  Patient initially presented to the ER with alcohol level of less than 10, urine drug screen positive for cocaine.  On the floor patient has received greater than 20 mg of Ativan today.  Appears to be in full DTs.  She does appear to be protecting her airway.  She is tachycardic.  She is moaning and flailing all extremities.  Pulmonary critical care was consulted for recommendations and management of agitated delirium and considerations for ICU admission  Pertinent  Medical History   Past Medical History:  Diagnosis Date  . Alcohol abuse   . Allergy    PCNS swelling  . Arthritis   . Bipolar 1 disorder ([place])   . Cancer ([place]) 01/21/2017   rectal cancer  . Cancer ([place])   . Cirrhosis of liver ([place])   . Depression   . Genetic testing 03/24/2017   Ms. Livingston underwent genetic counseling and testing for hereditary cancer syndromes on 02/17/2017. Her results were negative for mutations in all 46 genes analyzed by [facility]'s 46-gene Common Hereditary Cancers Panel. Genes analyzed include: APC, ATM, AXIN2, BARD1, BMPR1A, BRCA1, BRCA2, BRIP1, CDH1, CDKN2A, CHEK2, CTNNA1, DICER1, EPCAM, GREM1, HOXB13, KIT, MEN1, MLH1, MSH2, MSH3, MSH6, MUTYH, NBN,  . Hypertension      Significant Hospital Events: Including procedures, antibiotic start and stop dates in addition to other pertinent events    . 03/29/2021 ICU admission, alcohol withdrawal  Interim History / Subjective:  Unable to obtain due to agitated delirium  Objective   Blood pressure 106/67, pulse 92, temperature 97.8 F (36.6 C), temperature source Axillary, resp. rate (!) 23, height _0  (1.575 m), weight 46.4 kg, SpO2 96 %.       No intake or output data in the 24 hours ending 03/29/21 1835 Filed Weights   03/28/21 1216  Weight: 46.4 kg    Examination: General: Chronically ill-appearing female, appears older than stated age, frail, muscle wasting present, cachectic HENT: NCAT, temporalis muscle wasting, dry mucous membranes Lungs: Clear to auscultation bilaterally no crackles no wheeze Cardiovascular: , tachycardic, regular, S1-S2 Abdomen: Soft, colostomy left lower quadrant, possible small.  Peri-ostomy hernia Extremities: Thin extremities no significant edema, muscle wasting in the distal extremities calves forearms Neuro: Delirious, waving all 4 extremities in mitts into the air GU: Deferred  Labs/imaging that I havepersonally reviewed  (right click and "Reselect all SmartList Selections" daily)   ABG seven-point 3/36/30 1.5/18.6  Sodium 137 BUN 37 Serum creatinine 1.62 Magnesium 2.0 White blood cell count 7.9 Hemoglobin 9.4  Resolved Hospital Problem list     Assessment & Plan:   Acute metabolic toxic encephalopathy secondary to polysubstance abuse and withdrawal Likely withdrawal from alcohol abuse Unknown possible overdose, Cocaine abuse Plan: Continue as needed benzodiazepines Start Precedex As needed Fentanyl d5LR continiued Switched oral thiamine folate multivitamin once able Check ammonia   AKI P: Follow UOP Continue fluids  Check CK   At risk for  development of respiratory failure Concern for inability to protect airway with ongoing encephalopathy Plan: Will have NTS suctioning as needed Frequent checks with RT Close observation in the ICU for consideration of  intubation if needed.  History of colon cancer status post colostomy Plan: Ostomy nurse and regular routine wound care.  Severe protein calorie malnutrition Plan: May need core track for supplementation. At risk for refeeding syndrome Follow electrolytes.   Best practice (right click and "Reselect all SmartList Selections" daily)  Diet:  NPO Pain/Anxiety/Delirium protocol (if indicated): No VAP protocol (if indicated): Yes DVT prophylaxis: LMWH GI prophylaxis: PPI Glucose control:  SSI No Central venous access:  N/A Arterial line:  N/A Foley:  N/A Mobility:  bed rest  PT consulted: N/A Last date of multidisciplinary goals of care discussion [n/a] Code Status:  full code Disposition: ICU   Labs   CBC: Recent Labs  Lab 03/28/21 1317 03/28/21 2320 03/28/21 2352  WBC 5.9 7.9  --   NEUTROABS 5.2  --   --   HGB 11.8* 9.4* 9.5*  HCT 36.0 28.7* 28.0*  MCV 91.8 92.6  --   PLT 244 198  --     Basic Metabolic Panel: Recent Labs  Lab 03/28/21 1317 03/28/21 2320 03/28/21 2352  NA 135 137 139  K 5.0 3.6 3.7  CL 102 107  --   CO2 16* 19*  --   GLUCOSE 77 72  --   BUN 47* 37*  --   CREATININE 1.68* 1.62*  --   CALCIUM 9.8 9.3  --   MG  --  2.0  --   PHOS  --  3.5  --    GFR: Estimated Creatinine Clearance: 29.4 mL/min (A) (by C-G formula based on SCr of 1.62 mg/dL (H)). Recent Labs  Lab 03/28/21 1317 03/28/21 2320  WBC 5.9 7.9    Liver Function Tests: Recent Labs  Lab 03/28/21 1317 03/28/21 2320  AST 27 22  ALT 25 20  ALKPHOS 89 60  BILITOT 1.2 1.0  PROT 8.5* 6.7  ALBUMIN 3.9 3.1*   No results for input(s): LIPASE, AMYLASE in the last 168 hours. Recent Labs  Lab 03/28/21 1317  AMMONIA 19    ABG    Component Value Date/Time   HCO3 18.6 (L) 03/29/2021 1050   TCO2 18 (L) 03/28/2021 2352   ACIDBASEDEF 6.2 (H) 03/29/2021 1050   O2SAT 52.2 03/29/2021 1050     Coagulation Profile: No results for input(s): INR, PROTIME in the last 168  hours.  Cardiac Enzymes: Recent Labs  Lab 03/28/21 1551  CKTOTAL 205    HbA1C: No results found for: HGBA1C  CBG: Recent Labs  Lab 03/28/21 1847  GLUCAP 71    Review of Systems:   Unable to be obtained   Past Medical History:  She,  has a past medical history of Alcohol abuse, Allergy, Arthritis, Bipolar 1 disorder ([place]), Cancer ([place]) (01/21/2017), Cancer ([place]), Cirrhosis of liver ([place]), Depression, Genetic testing (03/24/2017), and Hypertension.   Surgical History:   Past Surgical History:  Procedure Laterality Date  . ABDOMINAL PERINEAL BOWEL RESECTION N/A 06/18/2017   Procedure: ABDOMINAL PERINEAL RESECTION ERAS PATHWAY;  Surgeon: Leighton Ruff, MD;  Location: [facility];  Service: General;  Laterality: N/A;  . COLON SURGERY    . COLONOSCOPY WITH PROPOFOL Left 01/21/2017   Procedure: COLONOSCOPY WITH PROPOFOL;  Surgeon: Otis Brace, MD;  Location: [facility];  Service: Gastroenterology;  Laterality: Left;  . FRACTURE SURGERY    . MANDIBLE  FRACTURE SURGERY       Social History:   reports that she has been smoking cigarettes. She has a 19.50 pack-year smoking history. She has never used smokeless tobacco. She reports current alcohol use. She reports current drug use. Drugs: "Crack" cocaine and Cocaine.   Family History:  Her family history includes Cancer (age of onset: 30) in her mother; Cancer - Other in her maternal aunt.   Allergies Allergies  Allergen Reactions  . Penicillins Hives and Swelling    Did it involve swelling of the face/tongue/throat, SOB, or low BP? Yes Did it involve sudden or severe rash/hives, skin peeling, or any reaction on the inside of your mouth or nose? No Did you need to seek medical attention at a hospital or doctor's office? No When did it last happen?<10 years If all above answers are "NO", may proceed with cephalosporin use.   Marland Kitchen Penicillins Hives and Swelling    Has patient had a PCN reaction causing immediate rash,  facial/tongue/throat swelling, SOB or lightheadedness with hypotension: Yes Has patient had a PCN reaction causing severe rash involving mucus membranes or skin necrosis: No Has patient had a PCN reaction that required hospitalization No Has patient had a PCN reaction occurring within the last 10 years: Yes If all of the above answers are "NO", then may proceed with Cephalosporin use.      Home Medications  Prior to Admission medications   Medication Sig Start Date End Date Taking? Authorizing Provider  amLODipine (NORVASC) 10 MG tablet Take 1 tablet (10 mg total) by mouth daily. 11/15/20   Argentina Donovan, PA-C  busPIRone (BUSPAR) 15 MG tablet Take 1 tablet (15 mg total) by mouth 2 (two) times daily. 11/15/20   Argentina Donovan, PA-C  cloNIDine (CATAPRES) 0.1 MG tablet Take 1 tablet (0.1 mg total) by mouth 3 (three) times daily. 11/15/20   Argentina Donovan, PA-C  hydrOXYzine (ATARAX/VISTARIL) 10 MG tablet Take 1 tablet (10 mg total) by mouth 3 (three) times daily as needed. As needed for acute anxiety 11/15/20   Argentina Donovan, PA-C  omeprazole (PRILOSEC) 40 MG capsule Take 1 capsule (40 mg total) by mouth daily. 11/15/20   Argentina Donovan, PA-C  ziprasidone (GEODON) 40 MG capsule Take 1 capsule (40 mg total) by mouth 2 (two) times daily with a meal. 11/06/11 01/09/12  Daleen Bo, MD     Critical care time:      This patient is critically ill with multiple organ system failure; which, requires frequent high complexity decision making, assessment, support, evaluation, and titration of therapies. This was completed through the application of advanced monitoring technologies and extensive interpretation of multiple databases. During this encounter critical care time was devoted to patient care services described in this note for 32 minutes.  [name], DO [facility] 03/29/2021 6:49 PM

## 2021-03-29 NOTE — Plan of Care (Signed)
Discussed in front of patient plan of care for the evening, pain management and withdrawal medication with no evidence of learning at this time.    Problem: Education: Goal: Knowledge of General Education information will improve Description: Including pain rating scale, medication(s)/side effects and non-pharmacologic comfort measures Outcome: Progressing

## 2021-03-29 NOTE — ED Notes (Signed)
Attending at bedside.

## 2021-03-29 NOTE — Progress Notes (Signed)
A women, Mariana Single stopped by to visit. She said that she is the patient's "step-Grandmother" and that the patient does not have living relatives. Ms. Gildardo Cranker said the patient was just released from prison on Monday. Ms. Gildardo Cranker would not give me her phone number, as the patient can not come to her house or live with her, as she's been trying to get the patient sober since the patient was 35.

## 2021-03-29 NOTE — Progress Notes (Signed)
Family member called for update but not on patient's contact list.  [name] is her cousin and helps when she has problems.  Phone [phone].  She would like to be contacted by the MD and SW.  She is in touch with her Grandma "Barbaraann Share."

## 2021-03-29 NOTE — Progress Notes (Signed)
SLP Cancellation Note  Patient Details Name: Sherry Baker MRN: [id number] DOB: [birth year]   Cancelled treatment:       Reason Eval/Treat Not Completed: Fatigue/lethargy limiting ability to participate (Pt's case discussed with [place], RN who indicated that the pt is currently unable to participate in the evaluation due to lethargy.)  Dymir Neeson I. Hardin Negus, [place], [place] Office number [phone] Pager [place] 03/29/2021, 3:01 PM

## 2021-03-30 ENCOUNTER — Inpatient Hospital Stay (HOSPITAL_COMMUNITY): Payer: Medicaid Other

## 2021-03-30 DIAGNOSIS — F1410 Cocaine abuse, uncomplicated: Secondary | ICD-10-CM | POA: Diagnosis not present

## 2021-03-30 DIAGNOSIS — J9601 Acute respiratory failure with hypoxia: Secondary | ICD-10-CM

## 2021-03-30 DIAGNOSIS — F10231 Alcohol dependence with withdrawal delirium: Secondary | ICD-10-CM | POA: Diagnosis not present

## 2021-03-30 LAB — GLUCOSE, CAPILLARY
Glucose-Capillary: 116 mg/dL — ABNORMAL HIGH (ref 70–99)
Glucose-Capillary: 114 mg/dL — ABNORMAL HIGH (ref 70–99)
Glucose-Capillary: 108 mg/dL — ABNORMAL HIGH (ref 70–99)
Glucose-Capillary: 139 mg/dL — ABNORMAL HIGH (ref 70–99)
Glucose-Capillary: 134 mg/dL — ABNORMAL HIGH (ref 70–99)

## 2021-03-30 LAB — COMPREHENSIVE METABOLIC PANEL
ALT: 15 U/L (ref 0–44)
AST: 18 U/L (ref 15–41)
Albumin: 2.6 g/dL — ABNORMAL LOW (ref 3.5–5.0)
Alkaline Phosphatase: 57 U/L (ref 38–126)
Anion gap: 9 (ref 5–15)
BUN: 21 mg/dL — ABNORMAL HIGH (ref 6–20)
CO2: 22 mmol/L (ref 22–32)
Calcium: 9.3 mg/dL (ref 8.9–10.3)
Chloride: 112 mmol/L — ABNORMAL HIGH (ref 98–111)
Creatinine, Ser: 1.4 mg/dL — ABNORMAL HIGH (ref 0.44–1.00)
GFR, Estimated: 45 mL/min — ABNORMAL LOW (ref >60)
Glucose, Bld: 129 mg/dL — ABNORMAL HIGH (ref 70–99)
Potassium: 3.9 mmol/L (ref 3.5–5.1)
Sodium: 143 mmol/L (ref 135–145)
Total Bilirubin: 0.6 mg/dL (ref 0.3–1.2)
Total Protein: 6 g/dL — ABNORMAL LOW (ref 6.5–8.1)

## 2021-03-30 LAB — CBC
HCT: 27.2 % — ABNORMAL LOW (ref 36.0–46.0)
Hemoglobin: 9 g/dL — ABNORMAL LOW (ref 12.0–15.0)
MCH: 30.5 pg (ref 26.0–34.0)
MCHC: 33.1 g/dL (ref 30.0–36.0)
MCV: 92.2 fL (ref 80.0–100.0)
Platelets: 176 10*3/uL (ref 150–400)
RBC: 2.95 MIL/uL — ABNORMAL LOW (ref 3.87–5.11)
RDW: 13.7 % (ref 11.5–15.5)
WBC: 6.8 10*3/uL (ref 4.0–10.5)
nRBC: 0 % (ref 0.0–0.2)

## 2021-03-30 LAB — POCT I-STAT 7, (LYTES, BLD GAS, ICA,H+H)
Acid-base deficit: 2 mmol/L (ref 0.0–2.0)
Bicarbonate: 22 mmol/L (ref 20.0–28.0)
Calcium, Ion: 1.27 mmol/L (ref 1.15–1.40)
HCT: 26 % — ABNORMAL LOW (ref 36.0–46.0)
Hemoglobin: 8.8 g/dL — ABNORMAL LOW (ref 12.0–15.0)
O2 Saturation: 98 %
Patient temperature: 98.6
Potassium: 3.4 mmol/L — ABNORMAL LOW (ref 3.5–5.1)
Sodium: 147 mmol/L — ABNORMAL HIGH (ref 135–145)
TCO2: 23 mmol/L (ref 22–32)
pCO2 arterial: 35.8 mmHg (ref 32.0–48.0)
pH, Arterial: 7.398 (ref 7.350–7.450)
pO2, Arterial: 98 mmHg (ref 83.0–108.0)

## 2021-03-30 LAB — BRAIN NATRIURETIC PEPTIDE: B Natriuretic Peptide: 231.8 pg/mL — ABNORMAL HIGH (ref 0.0–100.0)

## 2021-03-30 LAB — MRSA PCR SCREENING: MRSA by PCR: NEGATIVE

## 2021-03-30 MED ORDER — PROPOFOL 1000 MG/100ML IV EMUL
5.0000 ug/kg/min | INTRAVENOUS | Status: DC
  Administered 2021-03-31 (×2): 50 ug/kg/min via INTRAVENOUS
  Administered 2021-03-31: 40 ug/kg/min via INTRAVENOUS
  Administered 2021-04-01 (×4): 50 ug/kg/min via INTRAVENOUS
  Administered 2021-04-02: 40 ug/kg/min via INTRAVENOUS
  Administered 2021-04-02: 60 ug/kg/min via INTRAVENOUS
  Filled 2021-03-30 (×9): qty 100

## 2021-03-30 MED ORDER — ORAL CARE MOUTH RINSE: 15.0000 mL | Freq: Two times a day (BID) | OROMUCOSAL | Status: DC

## 2021-03-30 MED ORDER — HALOPERIDOL LACTATE 5 MG/ML IJ SOLN
5.0000 mg | Freq: Four times a day (QID) | INTRAMUSCULAR | Status: DC | PRN
  Administered 2021-03-30 – 2021-03-31 (×2): 5 mg via INTRAVENOUS
  Filled 2021-03-30 (×2): qty 1

## 2021-03-30 MED ORDER — FENTANYL CITRATE (PF) 100 MCG/2ML IJ SOLN
INTRAMUSCULAR | Status: AC
  Filled 2021-03-30: qty 2

## 2021-03-30 MED ORDER — CHLORHEXIDINE GLUCONATE 0.12% ORAL RINSE (MEDLINE KIT)
15.0000 mL | Freq: Two times a day (BID) | OROMUCOSAL | Status: DC
  Administered 2021-03-30 – 2021-04-04 (×11): 15 mL via OROMUCOSAL

## 2021-03-30 MED ORDER — HALOPERIDOL LACTATE 5 MG/ML IJ SOLN
INTRAMUSCULAR | Status: AC
  Filled 2021-03-30: qty 1

## 2021-03-30 MED ORDER — FUROSEMIDE 10 MG/ML IJ SOLN
20.0000 mg | Freq: Once | INTRAMUSCULAR | Status: AC
  Administered 2021-03-30: 20 mg via INTRAVENOUS
  Filled 2021-03-30: qty 2

## 2021-03-30 MED ORDER — PROPOFOL 1000 MG/100ML IV EMUL
INTRAVENOUS | Status: AC
  Administered 2021-03-30: 20 ug/kg/min via INTRAVENOUS
  Filled 2021-03-30: qty 100

## 2021-03-30 MED ORDER — ETOMIDATE 2 MG/ML IV SOLN: 20.0000 mg | Freq: Once | INTRAVENOUS | Status: DC

## 2021-03-30 MED ORDER — ROCURONIUM BROMIDE 10 MG/ML (PF) SYRINGE
PREFILLED_SYRINGE | INTRAVENOUS | Status: AC
  Administered 2021-03-30: 100 mg
  Filled 2021-03-30: qty 10

## 2021-03-30 MED ORDER — SODIUM CHLORIDE 0.9 % IV SOLN
INTRAVENOUS | Status: DC | PRN
  Administered 2021-03-30: 250 mL via INTRAVENOUS

## 2021-03-30 MED ORDER — CHLORHEXIDINE GLUCONATE 0.12 % MT SOLN
15.0000 mL | Freq: Two times a day (BID) | OROMUCOSAL | Status: DC
  Administered 2021-03-30: 15 mL via OROMUCOSAL

## 2021-03-30 MED ORDER — HALOPERIDOL LACTATE 5 MG/ML IJ SOLN
5.0000 mg | Freq: Once | INTRAMUSCULAR | Status: AC
  Administered 2021-03-30: 5 mg via INTRAVENOUS

## 2021-03-30 MED ORDER — ROCURONIUM BROMIDE 10 MG/ML (PF) SYRINGE: 100.0000 mg | PREFILLED_SYRINGE | Freq: Once | INTRAVENOUS | Status: DC

## 2021-03-30 MED ORDER — ORAL CARE MOUTH RINSE
15.0000 mL | OROMUCOSAL | Status: DC
  Administered 2021-03-30 – 2021-04-04 (×48): 15 mL via OROMUCOSAL

## 2021-03-30 MED ORDER — ETOMIDATE 2 MG/ML IV SOLN
INTRAVENOUS | Status: AC
  Administered 2021-03-30: 20 mg
  Filled 2021-03-30: qty 20

## 2021-03-30 MED ORDER — MIDAZOLAM HCL 2 MG/2ML IJ SOLN
INTRAMUSCULAR | Status: AC
  Filled 2021-03-30: qty 2

## 2021-03-30 NOTE — Progress Notes (Signed)
Patient has been suctioned and RT gave breathing treatment.  Patient is congested, expiratory wheezing and coarse crackles.  Exact output un measurable due to incontinence and pure wick leaking.  Called [facility] for CXR and possible lasix needed.

## 2021-03-30 NOTE — Progress Notes (Signed)
PCCM:  Called to the bedside for evaluation.  Persistent increase in O2 requirements. Patient transition to high flow earlier in the afternoon.  Unfortunately continues to be agitated.  Increasing as needed Haldol still Precedex. At this point I do not believe she is protecting her airway and is hypoventilatory. Decision was made for endotracheal intubation.  [name], DO [facility] 03/30/2021 7:09 PM

## 2021-03-30 NOTE — Progress Notes (Signed)
SLP Cancellation Note  Patient Details Name: Sherry Baker MRN: [id number] DOB: December 08, 1965   Cancelled treatment:        Attempted to see pt for clinical evaluation of swallowing.  Pt is not medically appropriate for PO trials at this time.  SLP will continue to follow for readiness for swallow assessement.   Celedonio Savage, MA, [place] Office: [phone]; Pager (5/28): [phone] 03/30/2021, 10:15 AM

## 2021-03-30 NOTE — Progress Notes (Addendum)
[facility] Progress Note Patient Name: Sherry Baker DOB: 1965-12-10 MRN: [id number]   Date of Service  03/30/2021  HPI/Events of Note  Patient has developed very coarse breath sounds and crackles per bedside RN, respiratory rate is up into the 40's, she has several fluid boluses in the past 24 hours as well as MIVF running at 100 ml / hour. Baseline CXR 2 days ago was unremarkable.  eICU Interventions  BNP ordered, Foley ordered for accurate assessment of urine output, stat portable CXR ordered to r/o pulmonary edema vs aspiration,  If pulmonary edema confirmed will consider BIPAP. Lasix 20 mg iv stat, discontinue iv fluids.        Kerry Kass Robbin [place] 03/30/2021, 5:14 AM

## 2021-03-30 NOTE — Progress Notes (Signed)
NAME:  Sherry Baker, MRN:  [id number], DOB:  [birth year], LOS: 1 ADMISSION DATE:  03/28/2021, CONSULTATION DATE: 03/29/2021 REFERRING MD: Dr. Sloan Leiter, CHIEF COMPLAINT: Alcohol withdrawal polysubstance abuse  History of Present Illness:  This is a 55 year old female, past medical history of polysubstance abuse, alcohol abuse, cocaine abuse, bipolar disease, arthritis, rectal colon cancer status post colostomy.  Patient with a history of cirrhosis.  Patient was found altered unresponsive lying in the floor, to have blood other female people present in the home.  This was concern for known high risk area of illicit activity.  Patient initially presented to the ER with alcohol level of less than 10, urine drug screen positive for cocaine.  On the floor patient has received greater than 20 mg of Ativan today.  Appears to be in full DTs.  She does appear to be protecting her airway.  She is tachycardic.  She is moaning and flailing all extremities.  Pulmonary critical care was consulted for recommendations and management of agitated delirium and considerations for ICU admission  Pertinent  Medical History   Past Medical History:  Diagnosis Date  . Alcohol abuse   . Allergy    PCNS swelling  . Arthritis   . Bipolar 1 disorder ([place])   . Cancer ([place]) 01/21/2017   rectal cancer  . Cancer ([place])   . Cirrhosis of liver ([place])   . Depression   . Genetic testing 03/24/2017   [name] underwent genetic counseling and testing for hereditary cancer syndromes on 02/17/2017. Her results were negative for mutations in all 46 genes analyzed by [facility]'s 46-gene Common Hereditary Cancers Panel. Genes analyzed include: APC, ATM, AXIN2, BARD1, BMPR1A, BRCA1, BRCA2, BRIP1, CDH1, CDKN2A, CHEK2, CTNNA1, DICER1, EPCAM, GREM1, HOXB13, KIT, MEN1, MLH1, MSH2, MSH3, MSH6, MUTYH, NBN,  . Hypertension      Significant Hospital Events: Including procedures, antibiotic start and stop dates in addition to other pertinent events    . 03/29/2021 ICU admission, alcohol withdrawal  Interim History / Subjective:   Remains agitated.  On Precedex infusion, did receive as needed Haldol this morning.  Objective   Blood pressure 127/75, pulse 89, temperature 100.1 F (37.8 C), temperature source Axillary, resp. rate (!) 35, height '5\' 2"'  (1.575 m), weight 46.4 kg, SpO2 92 %.    FiO2 (%):  [40 %] 40 %   Intake/Output Summary (Last 24 hours) at 03/30/2021 2446 Last data filed at 03/30/2021 0800 Gross per 24 hour  Intake 1721.76 ml  Output 1100 ml  Net 621.76 ml   Filed Weights   03/28/21 1216  Weight: 46.4 kg    Examination: General: Chronically ill-appearing female, appears older than stated age, thin frail muscle wasting cachectic HENT: NCAT, temporalis muscle wasting, mucous membranes dry Lungs: Clear to auscultation, diminished breath sounds no crackles Cardiovascular: Tachycardic, regular rate rhythm, S1-S2 Abdomen: Soft, left lower quadrant ostomy Extremities: Thin extremities, muscle wasting present Neuro: Delirious, will respond to stimulation GU: Deferred  Labs/imaging that I havepersonally reviewed  (right click and "Reselect all SmartList Selections" daily)   Sodium 143 BUN BUN 21 Serum creatinine serum creatinine 1.4  White blood cell count 6.8 Hemoglobin 9.0  Resolved Hospital Problem list     Assessment & Plan:   Acute metabolic toxic encephalopathy secondary to polysubstance abuse and withdrawal Likely withdrawal from alcohol abuse Unknown possible overdose, Cocaine abuse Plan: Continue as needed benzodiazepines Precedex Continue as needed fentanyl Continue as needed Haldol Increase ceiling on Precedex to 1.2  AKI P: Follow  urine output Due to poor p.o. intake during delirious state may need to consider some maintenance fluids like D5 LR Continue to follow closely  At risk for development of respiratory failure Concern for inability to protect airway with ongoing  encephalopathy Plan: NTS suctioning if needed Close observation in ICU for concern of intubation need  History of colon cancer status post colostomy Plan: Ostomy wound care as needed  Severe protein calorie malnutrition Plan: Core track order placed At risk for refeeding syndrome Follow electrolytes Initiate tube feeds once in place.  Best practice (right click and "Reselect all SmartList Selections" daily)  Diet:  NPO Pain/Anxiety/Delirium protocol (if indicated): No VAP protocol (if indicated): Yes DVT prophylaxis: LMWH GI prophylaxis: PPI Glucose control:  SSI No Central venous access:  N/A Arterial line:  N/A Foley:  N/A Mobility:  bed rest  PT consulted: N/A Last date of multidisciplinary goals of care discussion [n/a] Code Status:  full code Disposition: ICU   Labs   CBC: Recent Labs  Lab 03/28/21 1317 03/28/21 2320 03/28/21 2352 03/30/21 0153  WBC 5.9 7.9  --  6.8  NEUTROABS 5.2  --   --   --   HGB 11.8* 9.4* 9.5* 9.0*  HCT 36.0 28.7* 28.0* 27.2*  MCV 91.8 92.6  --  92.2  PLT 244 198  --  510    Basic Metabolic Panel: Recent Labs  Lab 03/28/21 1317 03/28/21 2320 03/28/21 2352 03/30/21 0153  NA 135 137 139 143  K 5.0 3.6 3.7 3.9  CL 102 107  --  112*  CO2 16* 19*  --  22  GLUCOSE 77 72  --  129*  BUN 47* 37*  --  21*  CREATININE 1.68* 1.62*  --  1.40*  CALCIUM 9.8 9.3  --  9.3  MG  --  2.0  --   --   PHOS  --  3.5  --   --    GFR: Estimated Creatinine Clearance: 34 mL/min (A) (by C-G formula based on SCr of 1.4 mg/dL (H)). Recent Labs  Lab 03/28/21 1317 03/28/21 2320 03/30/21 0153  WBC 5.9 7.9 6.8    Liver Function Tests: Recent Labs  Lab 03/28/21 1317 03/28/21 2320 03/30/21 0153  AST '27 22 18  ' ALT '25 20 15  ' ALKPHOS 89 60 57  BILITOT 1.2 1.0 0.6  PROT 8.5* 6.7 6.0*  ALBUMIN 3.9 3.1* 2.6*   No results for input(s): LIPASE, AMYLASE in the last 168 hours. Recent Labs  Lab 03/28/21 1317 03/29/21 2002  AMMONIA 19 23     ABG    Component Value Date/Time   HCO3 18.6 (L) 03/29/2021 1050   TCO2 18 (L) 03/28/2021 2352   ACIDBASEDEF 6.2 (H) 03/29/2021 1050   O2SAT 52.2 03/29/2021 1050     Coagulation Profile: No results for input(s): INR, PROTIME in the last 168 hours.  Cardiac Enzymes: Recent Labs  Lab 03/28/21 1551 03/29/21 2002  CKTOTAL 205 219    HbA1C: No results found for: HGBA1C  CBG: Recent Labs  Lab 03/28/21 1847 03/29/21 2009 03/29/21 2315 03/30/21 0331 03/30/21 0748  GLUCAP 71 101* 130* 116* 114*    Review of Systems:   Unable to be obtained   Past Medical History:  She,  has a past medical history of Alcohol abuse, Allergy, Arthritis, Bipolar 1 disorder ([place]), Cancer ([place]) (01/21/2017), Cancer ([place]), Cirrhosis of liver ([place]), Depression, Genetic testing (03/24/2017), and Hypertension.   Surgical History:   Past Surgical History:  Procedure  Laterality Date  . ABDOMINAL PERINEAL BOWEL RESECTION N/A 06/18/2017   Procedure: ABDOMINAL PERINEAL RESECTION ERAS PATHWAY;  Surgeon: Leighton Ruff, MD;  Location[facility];  Service: General;  Laterality: N/A;  . COLON SURGERY    . COLONOSCOPY WITH PROPOFOL Left 01/21/2017   Procedure: COLONOSCOPY WITH PROPOFOL;  Surgeon: Otis Brace, MD;  Location: [facility];  Service: Gastroenterology;  Laterality: Left;  . FRACTURE SURGERY    . MANDIBLE FRACTURE SURGERY       Social History:   reports that she has been smoking cigarettes. She has a 19.50 pack-year smoking history. She has never used smokeless tobacco. She reports current alcohol use. She reports current drug use. Drugs: "Crack" cocaine and Cocaine.   Family History:  Her family history includes Cancer (age of onset: 55) in her mother; Cancer - Other in her maternal aunt.   Allergies Allergies  Allergen Reactions  . Penicillins Hives and Swelling    Did it involve swelling of the face/tongue/throat, SOB, or low BP? Yes Did it involve sudden or severe rash/hives,  skin peeling, or any reaction on the inside of your mouth or nose? No Did you need to seek medical attention at a hospital or doctor's office? No When did it last happen?<10 years If all above answers are "NO", may proceed with cephalosporin use.   Marland Kitchen Penicillins Hives and Swelling    Has patient had a PCN reaction causing immediate rash, facial/tongue/throat swelling, SOB or lightheadedness with hypotension: Yes Has patient had a PCN reaction causing severe rash involving mucus membranes or skin necrosis: No Has patient had a PCN reaction that required hospitalization No Has patient had a PCN reaction occurring within the last 10 years: Yes If all of the above answers are "NO", then may proceed with Cephalosporin use.      Home Medications  Prior to Admission medications   Medication Sig Start Date End Date Taking? Authorizing Provider  amLODipine (NORVASC) 10 MG tablet Take 1 tablet (10 mg total) by mouth daily. 11/15/20   Argentina Donovan, PA-C  busPIRone (BUSPAR) 15 MG tablet Take 1 tablet (15 mg total) by mouth 2 (two) times daily. 11/15/20   Argentina Donovan, PA-C  cloNIDine (CATAPRES) 0.1 MG tablet Take 1 tablet (0.1 mg total) by mouth 3 (three) times daily. 11/15/20   Argentina Donovan, PA-C  hydrOXYzine (ATARAX/VISTARIL) 10 MG tablet Take 1 tablet (10 mg total) by mouth 3 (three) times daily as needed. As needed for acute anxiety 11/15/20   Argentina Donovan, PA-C  omeprazole (PRILOSEC) 40 MG capsule Take 1 capsule (40 mg total) by mouth daily. 11/15/20   Argentina Donovan, PA-C  ziprasidone (GEODON) 40 MG capsule Take 1 capsule (40 mg total) by mouth 2 (two) times daily with a meal. 11/06/11 01/09/12  Daleen Bo, MD     Critical care time:     This patient is critically ill with multiple organ system failure; which, requires frequent high complexity decision making, assessment, support, evaluation, and titration of therapies. This was completed through the application of  advanced monitoring technologies and extensive interpretation of multiple databases. During this encounter critical care time was devoted to patient care services described in this note for 32 minutes.   [name], DO [facility] 03/30/2021 9:36 AM

## 2021-03-30 NOTE — Progress Notes (Signed)
Pt started on HHFNC due to shallow respirations, WOB, >35 RR. RN aware.

## 2021-03-30 NOTE — Procedures (Signed)
Intubation Procedure Note  Sherry Baker  [id number]  1966-09-28  Date:03/30/21  Time:7:14 PM   Provider Performing:[name]    Procedure: Intubation (28118)  Indication(s) Respiratory Failure  Consent Unable to obtain consent due to emergent nature of procedure.  Anesthesia Etomidate, Versed and Rocuronium  Time Out Verified patient identification, verified procedure, site/side was marked, verified correct patient position, special equipment/implants available, medications/allergies/relevant history reviewed, required imaging and test results available.  Sterile Technique Usual hand hygeine, masks, and gloves were used  Procedure Description Patient positioned in bed supine.  Sedation given as noted above.  Patient was intubated with endotracheal tube using Glidescope.  View was Grade 1 full glottis .  Number of attempts was 1.  Colorimetric CO2 detector was consistent with tracheal placement.  Complications/Tolerance None; patient tolerated the procedure well. Chest X-ray is ordered to verify placement.  EBL Minimal  Specimen(s) None  [name], DO [facility] 03/30/2021 7:15 PM

## 2021-03-31 DIAGNOSIS — G9340 Encephalopathy, unspecified: Secondary | ICD-10-CM | POA: Diagnosis not present

## 2021-03-31 DIAGNOSIS — J9601 Acute respiratory failure with hypoxia: Secondary | ICD-10-CM | POA: Diagnosis not present

## 2021-03-31 LAB — CBC WITH DIFFERENTIAL/PLATELET
Abs Immature Granulocytes: 0.13 10*3/uL — ABNORMAL HIGH (ref 0.00–0.07)
Basophils Absolute: 0 10*3/uL (ref 0.0–0.1)
Basophils Relative: 0 %
Eosinophils Absolute: 0.1 10*3/uL (ref 0.0–0.5)
Eosinophils Relative: 1 %
HCT: 29.1 % — ABNORMAL LOW (ref 36.0–46.0)
Hemoglobin: 9.4 g/dL — ABNORMAL LOW (ref 12.0–15.0)
Immature Granulocytes: 1 %
Lymphocytes Relative: 9 %
Lymphs Abs: 0.9 10*3/uL (ref 0.7–4.0)
MCH: 30.5 pg (ref 26.0–34.0)
MCHC: 32.3 g/dL (ref 30.0–36.0)
MCV: 94.5 fL (ref 80.0–100.0)
Monocytes Absolute: 0.7 10*3/uL (ref 0.1–1.0)
Monocytes Relative: 7 %
Neutro Abs: 8.6 10*3/uL — ABNORMAL HIGH (ref 1.7–7.7)
Neutrophils Relative %: 82 %
Platelets: 180 10*3/uL (ref 150–400)
RBC: 3.08 MIL/uL — ABNORMAL LOW (ref 3.87–5.11)
RDW: 13.9 % (ref 11.5–15.5)
WBC: 10.5 10*3/uL (ref 4.0–10.5)
nRBC: 0 % (ref 0.0–0.2)

## 2021-03-31 LAB — BASIC METABOLIC PANEL
Anion gap: 9 (ref 5–15)
BUN: 27 mg/dL — ABNORMAL HIGH (ref 6–20)
CO2: 22 mmol/L (ref 22–32)
Calcium: 8.8 mg/dL — ABNORMAL LOW (ref 8.9–10.3)
Chloride: 113 mmol/L — ABNORMAL HIGH (ref 98–111)
Creatinine, Ser: 1.71 mg/dL — ABNORMAL HIGH (ref 0.44–1.00)
GFR, Estimated: 35 mL/min — ABNORMAL LOW (ref >60)
Glucose, Bld: 92 mg/dL (ref 70–99)
Potassium: 3.6 mmol/L (ref 3.5–5.1)
Sodium: 144 mmol/L (ref 135–145)

## 2021-03-31 LAB — GLUCOSE, CAPILLARY
Glucose-Capillary: 85 mg/dL (ref 70–99)
Glucose-Capillary: 95 mg/dL (ref 70–99)
Glucose-Capillary: 93 mg/dL (ref 70–99)
Glucose-Capillary: 87 mg/dL (ref 70–99)
Glucose-Capillary: 92 mg/dL (ref 70–99)

## 2021-03-31 LAB — MAGNESIUM: Magnesium: 1.9 mg/dL (ref 1.7–2.4)

## 2021-03-31 LAB — TRIGLYCERIDES: Triglycerides: 137 mg/dL (ref <150)

## 2021-03-31 MED ORDER — POTASSIUM CHLORIDE 20 MEQ PO PACK: 20.0000 meq | PACK | Freq: Every day | ORAL | Status: DC

## 2021-03-31 MED ORDER — LACTATED RINGERS IV SOLN: INTRAVENOUS | Status: DC

## 2021-03-31 MED ORDER — POTASSIUM CHLORIDE 20 MEQ PO PACK
20.0000 meq | PACK | Freq: Every day | ORAL | Status: DC
  Administered 2021-03-31 – 2021-04-04 (×5): 20 meq
  Filled 2021-03-31 (×5): qty 1

## 2021-03-31 MED ORDER — PANTOPRAZOLE SODIUM 40 MG PO PACK
40.0000 mg | PACK | Freq: Every day | ORAL | Status: DC
  Administered 2021-03-31 – 2021-04-04 (×5): 40 mg
  Filled 2021-03-31 (×5): qty 20

## 2021-03-31 MED ORDER — SODIUM CHLORIDE 0.9 % IV SOLN
2.0000 g | INTRAVENOUS | Status: DC
  Administered 2021-03-31 – 2021-04-02 (×4): 2 g via INTRAVENOUS
  Filled 2021-03-31 (×4): qty 20

## 2021-03-31 MED ORDER — SODIUM CHLORIDE 0.9 % IV BOLUS
500.0000 mL | Freq: Once | INTRAVENOUS | Status: AC
  Administered 2021-03-31: 500 mL via INTRAVENOUS

## 2021-03-31 MED ORDER — VITAL HIGH PROTEIN PO LIQD
1000.0000 mL | ORAL | Status: DC
  Administered 2021-03-31: 1000 mL

## 2021-03-31 NOTE — Progress Notes (Addendum)
NAME:  Sherry Baker, MRN:  [id number], DOB:  15-Jul-1966, LOS: 2 ADMISSION DATE:  03/28/2021, CONSULTATION DATE: 03/29/2021 REFERRING MD: Dr. Sloan Leiter, CHIEF COMPLAINT: Alcohol withdrawal polysubstance abuse  History of Present Illness:  This is a 55 year old female, past medical history of polysubstance abuse, alcohol abuse, cocaine abuse, bipolar disease, arthritis, rectal colon cancer status post colostomy.  Patient with a history of cirrhosis.  Patient was found altered unresponsive lying in the floor, to have blood other female people present in the home.  This was concern for known high risk area of illicit activity.  Patient initially presented to the ER with alcohol level of less than 10, urine drug screen positive for cocaine.  On the floor patient  received greater than 20 mg of Ativan today.  Appears to be in full DTs.  Placed on Precedex initially but intubated 5/28 for airway protection  Pertinent  Medical History   Past Medical History:  Diagnosis Date  . Alcohol abuse   . Allergy    PCNS swelling  . Arthritis   . Bipolar 1 disorder ([place])   . Cancer ([place]) 01/21/2017   rectal cancer  . Cancer ([place])   . Cirrhosis of liver ([place])   . Depression   . Genetic testing 03/24/2017   Ms. Knueppel underwent genetic counseling and testing for hereditary cancer syndromes on 02/17/2017. Her results were negative for mutations in all 46 genes analyzed by [facility]'s 46-gene Common Hereditary Cancers Panel. Genes analyzed include: APC, ATM, AXIN2, BARD1, BMPR1A, BRCA1, BRCA2, BRIP1, CDH1, CDKN2A, CHEK2, CTNNA1, DICER1, EPCAM, GREM1, HOXB13, KIT, MEN1, MLH1, MSH2, MSH3, MSH6, MUTYH, NBN,  . Hypertension      Significant Hospital Events: Including procedures, antibiotic start and stop dates in addition to other pertinent events   . 03/29/2021 ICU admission, alcohol withdrawal . 5/28 ETT >>  Interim History / Subjective:  Intubated late evening yesterday for airway protection and  hypoventilation. Sedated on propofol drip.  Precedex caused bradycardia Afebrile Urine output not charted last 12 hours   Objective   Blood pressure 104/64, pulse 71, temperature 97.8 F (36.6 C), temperature source Oral, resp. rate (!) 22, height '5\' 2"'  (1.575 m), weight 46.6 kg, SpO2 100 %.    Vent Mode: PSV;CPAP FiO2 (%):  [40 %-50 %] 40 % Set Rate:  [20 bmp-22 bmp] 22 bmp Vt Set:  [400 mL] 400 mL PEEP:  [5 cmH20] 5 cmH20 Pressure Support:  [8 cmH20] 8 cmH20 Plateau Pressure:  [15 cmH20-18 cmH20] 16 cmH20   Intake/Output Summary (Last 24 hours) at 03/31/2021 4193 Last data filed at 03/31/2021 0600 Gross per 24 hour  Intake 860.38 ml  Output 450 ml  Net 410.38 ml   Filed Weights   03/28/21 1216 03/31/21 0517  Weight: 46.4 kg 46.6 kg    Examination: General: Chronically ill-appearing female, appears older than stated age, thin frail muscle wasting cachectic HENT: No pallor, no icterus, dry mucous membranes Lungs: Bilateral air entry present, no accessory muscle use, no rhonchi Cardiovascular: S1-S2 regular Abdomen: Soft, left lower quadrant ostomy Extremities: Thin extremities, muscle wasting present Neuro: Sedated on propofol, intermittently agitated, RASS -2 GU: Deferred  Labs/imaging that I havepersonally reviewed  (right click and "Reselect all SmartList Selections" daily)   ABG appears normal. Labs show mild hypokalemia, slight increase in creatinine from 1.4-1.7 , stable anemia and thrombocytopenia  Chest x-ray 5/28 independently reviewed, left midlung infiltrate is improved but retrocardiac infiltrate persists  resp cx 5/29 >>  Head CT 5/26  right maxillary sinus opacified  Resolved Hospital Problem list     Assessment & Plan:   Acute metabolic toxic encephalopathy secondary to polysubstance abuse and withdrawal Likely withdrawal from alcohol abuse Unknown possible overdose, Cocaine abuse Plan: Continue as needed benzodiazepines Changed from Precedex  to propofol drip due to bradycardia, goal RASS 0 to -1 Continue as needed fentanyl Continue as needed Haldol  Acute respiratory failure, intubated for airway protection 5/28  -Start spontaneous breathing trials once mental status improves Start empiric Unasyn for left lower lobe aspiration pneumonia while awaiting respiratory culture  AKI P: Follow urine output Start LR at 50 cc an hour Replete mild hypokalemia  History of colon cancer status post colostomy Plan: Ostomy wound care as needed  Severe protein calorie malnutrition Plan: Core track >> At risk for refeeding syndrome Start tube feeds  Best practice (right click and "Reselect all SmartList Selections" daily)  Diet:  NPO Pain/Anxiety/Delirium protocol (if indicated): Yes (RASS goal 0) VAP protocol (if indicated): Yes DVT prophylaxis: LMWH GI prophylaxis: PPI Glucose control:  SSI No Central venous access:  N/A Arterial line:  N/A Foley:  N/A Mobility:  bed rest  PT consulted: N/A Last date of multidisciplinary goals of care discussion [n/a] Code Status:  full code Disposition: ICU   Labs   CBC: Recent Labs  Lab 03/28/21 1317 03/28/21 2320 03/28/21 2352 03/30/21 0153 03/30/21 1949 03/31/21 0459  WBC 5.9 7.9  --  6.8  --  10.5  NEUTROABS 5.2  --   --   --   --  8.6*  HGB 11.8* 9.4* 9.5* 9.0* 8.8* 9.4*  HCT 36.0 28.7* 28.0* 27.2* 26.0* 29.1*  MCV 91.8 92.6  --  92.2  --  94.5  PLT 244 198  --  176  --  027    Basic Metabolic Panel: Recent Labs  Lab 03/28/21 1317 03/28/21 2320 03/28/21 2352 03/30/21 0153 03/30/21 1949 03/31/21 0459  NA 135 137 139 143 147* 144  K 5.0 3.6 3.7 3.9 3.4* 3.6  CL 102 107  --  112*  --  113*  CO2 16* 19*  --  22  --  22  GLUCOSE 77 72  --  129*  --  92  BUN 47* 37*  --  21*  --  27*  CREATININE 1.68* 1.62*  --  1.40*  --  1.71*  CALCIUM 9.8 9.3  --  9.3  --  8.8*  MG  --  2.0  --   --   --  1.9  PHOS  --  3.5  --   --   --   --    GFR: Estimated Creatinine  Clearance: 28 mL/min (A) (by C-G formula based on SCr of 1.71 mg/dL (H)). Recent Labs  Lab 03/28/21 1317 03/28/21 2320 03/30/21 0153 03/31/21 0459  WBC 5.9 7.9 6.8 10.5    Liver Function Tests: Recent Labs  Lab 03/28/21 1317 03/28/21 2320 03/30/21 0153  AST '27 22 18  ' ALT '25 20 15  ' ALKPHOS 89 60 57  BILITOT 1.2 1.0 0.6  PROT 8.5* 6.7 6.0*  ALBUMIN 3.9 3.1* 2.6*   No results for input(s): LIPASE, AMYLASE in the last 168 hours. Recent Labs  Lab 03/28/21 1317 03/29/21 2002  AMMONIA 19 23    ABG    Component Value Date/Time   PHART 7.398 03/30/2021 1949   PCO2ART 35.8 03/30/2021 1949   PO2ART 98 03/30/2021 1949   HCO3 22.0 03/30/2021 1949   TCO2 23  03/30/2021 1949   ACIDBASEDEF 2.0 03/30/2021 1949   O2SAT 98.0 03/30/2021 1949     Coagulation Profile: No results for input(s): INR, PROTIME in the last 168 hours.  Cardiac Enzymes: Recent Labs  Lab 03/28/21 1551 03/29/21 2002  CKTOTAL 205 219    HbA1C: No results found for: HGBA1C  CBG: Recent Labs  Lab 03/30/21 0331 03/30/21 0748 03/30/21 1102 03/30/21 1535 03/30/21 1951  GLUCAP 116* 114* 108* 139* 134*     Critical care time: [place]. [facility] Pager : [phone]  If no response to pager , please call [phone] until 7 pm After 7:00 pm call Elink  [phone]   03/31/2021   03/31/2021 8:06 AM

## 2021-03-31 NOTE — Progress Notes (Signed)
SLP Cancellation Note  Patient Details Name: Sherry Baker MRN: [id number] DOB: [birth year]   Cancelled treatment:       Reason Eval/Treat Not Completed: Patient not medically ready. No intubated. Will d/c orders and await orders when medically ready.   Cristin Szatkowski, Katherene Ponto 03/31/2021, 8:26 AM

## 2021-04-01 ENCOUNTER — Inpatient Hospital Stay (HOSPITAL_COMMUNITY): Payer: Medicaid Other

## 2021-04-01 DIAGNOSIS — J9601 Acute respiratory failure with hypoxia: Secondary | ICD-10-CM | POA: Diagnosis not present

## 2021-04-01 DIAGNOSIS — F10231 Alcohol dependence with withdrawal delirium: Secondary | ICD-10-CM | POA: Diagnosis not present

## 2021-04-01 DIAGNOSIS — F14921 Cocaine use, unspecified with intoxication delirium: Secondary | ICD-10-CM | POA: Diagnosis not present

## 2021-04-01 LAB — PHOSPHORUS: Phosphorus: 3.3 mg/dL (ref 2.5–4.6)

## 2021-04-01 LAB — BASIC METABOLIC PANEL
Anion gap: 8 (ref 5–15)
BUN: 23 mg/dL — ABNORMAL HIGH (ref 6–20)
CO2: 21 mmol/L — ABNORMAL LOW (ref 22–32)
Calcium: 8.8 mg/dL — ABNORMAL LOW (ref 8.9–10.3)
Chloride: 116 mmol/L — ABNORMAL HIGH (ref 98–111)
Creatinine, Ser: 1.49 mg/dL — ABNORMAL HIGH (ref 0.44–1.00)
GFR, Estimated: 42 mL/min — ABNORMAL LOW (ref >60)
Glucose, Bld: 98 mg/dL (ref 70–99)
Potassium: 3.1 mmol/L — ABNORMAL LOW (ref 3.5–5.1)
Sodium: 145 mmol/L (ref 135–145)

## 2021-04-01 LAB — GLUCOSE, CAPILLARY
Glucose-Capillary: 100 mg/dL — ABNORMAL HIGH (ref 70–99)
Glucose-Capillary: 108 mg/dL — ABNORMAL HIGH (ref 70–99)
Glucose-Capillary: 77 mg/dL (ref 70–99)
Glucose-Capillary: 88 mg/dL (ref 70–99)
Glucose-Capillary: 91 mg/dL (ref 70–99)
Glucose-Capillary: 98 mg/dL (ref 70–99)

## 2021-04-01 LAB — CBC
HCT: 26.7 % — ABNORMAL LOW (ref 36.0–46.0)
Hemoglobin: 8.6 g/dL — ABNORMAL LOW (ref 12.0–15.0)
MCH: 30.2 pg (ref 26.0–34.0)
MCHC: 32.2 g/dL (ref 30.0–36.0)
MCV: 93.7 fL (ref 80.0–100.0)
Platelets: 201 10*3/uL (ref 150–400)
RBC: 2.85 MIL/uL — ABNORMAL LOW (ref 3.87–5.11)
RDW: 13.8 % (ref 11.5–15.5)
WBC: 7.7 10*3/uL (ref 4.0–10.5)
nRBC: 0 % (ref 0.0–0.2)

## 2021-04-01 LAB — CK: Total CK: 28 U/L — ABNORMAL LOW (ref 38–234)

## 2021-04-01 LAB — MAGNESIUM: Magnesium: 1.8 mg/dL (ref 1.7–2.4)

## 2021-04-01 MED ORDER — ADULT MULTIVITAMIN W/MINERALS CH
1.0000 | ORAL_TABLET | Freq: Every day | ORAL | Status: DC
  Administered 2021-04-02 – 2021-04-08 (×7): 1
  Filled 2021-04-01 (×7): qty 1

## 2021-04-01 MED ORDER — VITAL AF 1.2 CAL PO LIQD
1000.0000 mL | ORAL | Status: AC
  Administered 2021-04-01 – 2021-04-03 (×3): 1000 mL
  Filled 2021-04-01 (×4): qty 1000

## 2021-04-01 MED ORDER — MAGNESIUM SULFATE 2 GM/50ML IV SOLN
2.0000 g | Freq: Once | INTRAVENOUS | Status: AC
  Administered 2021-04-01: 2 g via INTRAVENOUS
  Filled 2021-04-01: qty 50

## 2021-04-01 MED ORDER — LORAZEPAM 2 MG/ML PO CONC
1.0000 mg | Freq: Two times a day (BID) | ORAL | Status: DC
  Administered 2021-04-01 – 2021-04-02 (×3): 1 mg
  Filled 2021-04-01 (×3): qty 1

## 2021-04-01 MED ORDER — POTASSIUM CHLORIDE 10 MEQ/100ML IV SOLN
10.0000 meq | INTRAVENOUS | Status: AC
  Administered 2021-04-01 (×2): 10 meq via INTRAVENOUS
  Filled 2021-04-01 (×2): qty 100

## 2021-04-01 NOTE — Progress Notes (Signed)
NAME:  Sherry Baker, MRN:  [id number], DOB:  1966-08-29, LOS: 3 ADMISSION DATE:  03/28/2021, CONSULTATION DATE: 03/29/2021 REFERRING MD: Dr. Sloan Leiter, CHIEF COMPLAINT: Alcohol withdrawal polysubstance abuse  History of Present Illness:   55 year old female, past medical history of polysubstance abuse, alcohol abuse, cocaine abuse, bipolar disease, arthritis, rectal colon cancer status post colostomy.  Patient with a history of cirrhosis.  Patient was found altered unresponsive lying in the floor, to have blood other female people present in the home.  This was concern for known high risk area of illicit activity.  Patient initially presented to the ER with alcohol level of less than 10, urine drug screen positive for cocaine.  On the floor patient  received greater than 20 mg of Ativan today.  Appears to be in full DTs.  Placed on Precedex initially but intubated 5/28 for airway protection  Pertinent  Medical History   Past Medical History:  Diagnosis Date  . Alcohol abuse   . Allergy    PCNS swelling  . Arthritis   . Bipolar 1 disorder ([place])   . Cancer ([place]) 01/21/2017   rectal cancer  . Cancer ([place])   . Cirrhosis of liver ([place])   . Depression   . Genetic testing 03/24/2017   Ms. Casebeer underwent genetic counseling and testing for hereditary cancer syndromes on 02/17/2017. Her results were negative for mutations in all 46 genes analyzed by [facility]'s 46-gene Common Hereditary Cancers Panel. Genes analyzed include: APC, ATM, AXIN2, BARD1, BMPR1A, BRCA1, BRCA2, BRIP1, CDH1, CDKN2A, CHEK2, CTNNA1, DICER1, EPCAM, GREM1, HOXB13, KIT, MEN1, MLH1, MSH2, MSH3, MSH6, MUTYH, NBN,  . Hypertension      Significant Hospital Events: Including procedures, antibiotic start and stop dates in addition to other pertinent events   . 03/29/2021 ICU admission, alcohol withdrawal . 5/28 ETT >> . 5/28 Precedex caused bradycardia , low UO  Interim History / Subjective:   Remains critically ill, intubated On  high-dose propofol, gets agitated when this is lowered. Urine output improved with fluid challenge. Afebrile    Objective   Blood pressure (!) 148/77, pulse 85, temperature 98.8 F (37.1 C), temperature source Oral, resp. rate (!) 24, height '5\' 2"'  (1.575 m), weight 46.6 kg, SpO2 100 %.    Vent Mode: PRVC FiO2 (%):  [40 %] 40 % Set Rate:  [22 bmp] 22 bmp Vt Set:  [400 mL] 400 mL PEEP:  [5 cmH20] 5 cmH20 Plateau Pressure:  [12 cmH20-14 cmH20] 14 cmH20   Intake/Output Summary (Last 24 hours) at 04/01/2021 0813 Last data filed at 04/01/2021 0600 Gross per 24 hour  Intake 1473.94 ml  Output 1005 ml  Net 468.94 ml   Filed Weights   03/28/21 1216 03/31/21 0517 04/01/21 0413  Weight: 46.4 kg 46.6 kg 46.6 kg    Examination: General: Chronically ill-appearing female, appears older than stated age, thin frail muscle wasting cachectic HENT: No pallor, no icterus, dry mucous membranes Lungs: No accessory muscle use, bilateral scattered rhonchi Cardiovascular: S1-S2 regular Abdomen: Soft, left lower quadrant ostomy, pink Extremities: Thin extremities, muscle wasting present Neuro: RASS -1, on propofol, intermittent agitation    Labs/imaging that I havepersonally reviewed  (right click and "Reselect all SmartList Selections" daily)   Labs show hypokalemia, decrease creatinine, stable anemia, no leukocytosis  Chest x-ray 5/ 30 independently reviewed shows improved left perihilar infiltrate  resp cx 5/29 >>  Head CT 5/26 right maxillary sinus opacified  Resolved Hospital Problem list     Assessment & Plan:  Acute metabolic toxic encephalopathy secondary to polysubstance abuse and withdrawal Likely withdrawal from alcohol abuse Unknown possible overdose, Cocaine abuse Plan: Add Ativan 1 twice daily Continue propofol with goal RASS 0 to -1, hopefully addition of Ativan will enable lowering doses, did not tolerate Precedex due to bradycardia Continue as needed  fentanyl Continue as needed Haldol  Acute respiratory failure, intubated for airway protection 5/28 Aspiration pneumonia  -Start spontaneous breathing trials once mental status improves -Ceftriaxone 5/29 >> plan for 5 days, await respiratory culture  AKI P: Improving, continue LR at 50 cc an hour, obtain CK to ensure not rhabdo. Replete potassium for hypokalemia  History of colon cancer status post colostomy Plan: Ostomy wound care as needed  Severe protein calorie malnutrition Plan: Core track >> tube feeds to increase to goal At risk for refeeding syndrome   Best practice (right click and "Reselect all SmartList Selections" daily)  Diet:  NPO Pain/Anxiety/Delirium protocol (if indicated): Yes (RASS goal 0) VAP protocol (if indicated): Yes DVT prophylaxis: LMWH GI prophylaxis: PPI Glucose control:  SSI No Central venous access:  N/A Arterial line:  N/A Foley:  N/A Mobility:  bed rest  PT consulted: N/A Last date of multidisciplinary goals of care discussion [n/a] updated aunt Barbaraann Share 5/30 she is the only available family member [phone] Code Status:  full code Disposition: ICU   Labs   CBC: Recent Labs  Lab 03/28/21 1317 03/28/21 2320 03/28/21 2352 03/30/21 0153 03/30/21 1949 03/31/21 0459 04/01/21 0215  WBC 5.9 7.9  --  6.8  --  10.5 7.7  NEUTROABS 5.2  --   --   --   --  8.6*  --   HGB 11.8* 9.4* 9.5* 9.0* 8.8* 9.4* 8.6*  HCT 36.0 28.7* 28.0* 27.2* 26.0* 29.1* 26.7*  MCV 91.8 92.6  --  92.2  --  94.5 93.7  PLT 244 198  --  176  --  180 032    Basic Metabolic Panel: Recent Labs  Lab 03/28/21 1317 03/28/21 2320 03/28/21 2352 03/30/21 0153 03/30/21 1949 03/31/21 0459 04/01/21 0215  NA 135 137 139 143 147* 144 145  K 5.0 3.6 3.7 3.9 3.4* 3.6 3.1*  CL 102 107  --  112*  --  113* 116*  CO2 16* 19*  --  22  --  22 21*  GLUCOSE 77 72  --  129*  --  92 98  BUN 47* 37*  --  21*  --  27* 23*  CREATININE 1.68* 1.62*  --  1.40*  --  1.71* 1.49*   CALCIUM 9.8 9.3  --  9.3  --  8.8* 8.8*  MG  --  2.0  --   --   --  1.9 1.8  PHOS  --  3.5  --   --   --   --  3.3   GFR: Estimated Creatinine Clearance: 32.1 mL/min (A) (by C-G formula based on SCr of 1.49 mg/dL (H)). Recent Labs  Lab 03/28/21 2320 03/30/21 0153 03/31/21 0459 04/01/21 0215  WBC 7.9 6.8 10.5 7.7    Liver Function Tests: Recent Labs  Lab 03/28/21 1317 03/28/21 2320 03/30/21 0153  AST '27 22 18  ' ALT '25 20 15  ' ALKPHOS 89 60 57  BILITOT 1.2 1.0 0.6  PROT 8.5* 6.7 6.0*  ALBUMIN 3.9 3.1* 2.6*   No results for input(s): LIPASE, AMYLASE in the last 168 hours. Recent Labs  Lab 03/28/21 1317 03/29/21 2002  AMMONIA 19 23  ABG    Component Value Date/Time   PHART 7.398 03/30/2021 1949   PCO2ART 35.8 03/30/2021 1949   PO2ART 98 03/30/2021 1949   HCO3 22.0 03/30/2021 1949   TCO2 23 03/30/2021 1949   ACIDBASEDEF 2.0 03/30/2021 1949   O2SAT 98.0 03/30/2021 1949     Coagulation Profile: No results for input(s): INR, PROTIME in the last 168 hours.  Cardiac Enzymes: Recent Labs  Lab 03/28/21 1551 03/29/21 2002  CKTOTAL 205 219    HbA1C: No results found for: HGBA1C  CBG: Recent Labs  Lab 03/31/21 1104 03/31/21 1545 03/31/21 2018 03/31/21 2325 04/01/21 0429  GLUCAP 95 93 87 92 88     Critical care time: [place] MD. FCCP. [facility] Pager : [phone]  If no response to pager , please call [phone] until 7 pm After 7:00 pm call Elink  [phone]    04/01/2021 8:13 AM

## 2021-04-01 NOTE — Progress Notes (Signed)
Initial Nutrition Assessment  DOCUMENTATION CODES:   Severe malnutrition in context of chronic illness  INTERVENTION:   D/C Vital High Protein  Tube feeding via OG tube: Vital AF 1.2 at 50 ml/h (1200 ml per day)  Provides 1440 kcal, 90 gm protein, 973 ml free water daily TF regimen and propofol at current rate providing 1783 total kcal/day   Continue MVI with minerals daily    NUTRITION DIAGNOSIS:   Severe Malnutrition related to chronic illness (polysubstance abuse, cirrhosis) as evidenced by severe muscle depletion,severe fat depletion.  GOAL:   Patient will meet greater than or equal to 90% of their needs  MONITOR:   TF tolerance,Labs  REASON FOR ASSESSMENT:   Consult,Ventilator Enteral/tube feeding initiation and management  ASSESSMENT:   Pt with PMH of ETOH abuse, Bipolar 1 disorder, polysubstance abuse, cirrhosis, depression, HTN, rectal cancer s/p colostomy now admitted after being found unresponsive with acute metabolic toxic encephalopathy secondary to polysubstance abuse and withdrawal. Pt positive for cocaine on admission.   Per MD plan for cotrak placement 5/31 Spoke with RN at bedside.   5/28 intubated for airway protection 5/29 trickle TF started  Patient is currently intubated on ventilator support MV: 8.6 L/min Temp (24hrs), Avg:99 F (37.2 C), Min:98.4 F (36.9 C), Max:100 F (37.8 C)  Propofol: 13 ml/hr provides: 343 kcal  Medications reviewed and include: MVI with minerals, protonix, 20 mEq KCl daily per tube LR @ 50 ml/hr 10 mEq KCl x 3 IV Thiamine 500 mg TID IV Labs reviewed: K+ 3.1, PO4: 3.3, Magnesium: 1.8   UOP: 990 ml  16 F OG tube: per xray courses into the abdomen, tip and side port not visualized   NUTRITION - FOCUSED PHYSICAL EXAM:  Flowsheet Row Most Recent Value  Orbital Region Severe depletion  Upper Arm Region Severe depletion  Thoracic and Lumbar Region Severe depletion  Buccal Region Unable to assess  Temple  Region Severe depletion  Clavicle Bone Region Severe depletion  Clavicle and Acromion Bone Region Severe depletion  Scapular Bone Region Unable to assess  Dorsal Hand Severe depletion  Patellar Region Severe depletion  Anterior Thigh Region Severe depletion  Posterior Calf Region Severe depletion  Edema (RD Assessment) None  Hair Reviewed  Eyes Unable to assess  Mouth Unable to assess  Skin Reviewed  Nails Reviewed       Diet Order:   Diet Order            Diet NPO time specified  Diet effective now                 EDUCATION NEEDS:   No education needs have been identified at this time  Skin:  Skin Assessment: Skin Integrity Issues: Skin Integrity Issues:: Unstageable Unstageable: coccyx  Last BM:  50 ml via ostomy  Height:   Ht Readings from Last 1 Encounters:  03/28/21 5\' 2"  (1.575 m)    Weight:   Wt Readings from Last 1 Encounters:  04/01/21 46.6 kg    Ideal Body Weight:  50 kg  BMI:  Body mass index is 18.79 kg/m.  Estimated Nutritional Needs:   Kcal:  1500-1700  Protein:  70-90 grams  Fluid:  >1.5 L/day  Lockie Pares., RD, LDN, CNSC See AMiON for contact information

## 2021-04-02 ENCOUNTER — Inpatient Hospital Stay (HOSPITAL_COMMUNITY): Payer: Medicaid Other

## 2021-04-02 DIAGNOSIS — F10231 Alcohol dependence with withdrawal delirium: Secondary | ICD-10-CM | POA: Diagnosis not present

## 2021-04-02 DIAGNOSIS — J9601 Acute respiratory failure with hypoxia: Secondary | ICD-10-CM | POA: Diagnosis not present

## 2021-04-02 DIAGNOSIS — J15211 Pneumonia due to Methicillin susceptible Staphylococcus aureus: Secondary | ICD-10-CM | POA: Diagnosis not present

## 2021-04-02 LAB — BASIC METABOLIC PANEL
Anion gap: 9 (ref 5–15)
BUN: 21 mg/dL — ABNORMAL HIGH (ref 6–20)
CO2: 22 mmol/L (ref 22–32)
Calcium: 8.8 mg/dL — ABNORMAL LOW (ref 8.9–10.3)
Chloride: 112 mmol/L — ABNORMAL HIGH (ref 98–111)
Creatinine, Ser: 1.42 mg/dL — ABNORMAL HIGH (ref 0.44–1.00)
GFR, Estimated: 44 mL/min — ABNORMAL LOW (ref >60)
Glucose, Bld: 97 mg/dL (ref 70–99)
Potassium: 3.6 mmol/L (ref 3.5–5.1)
Sodium: 143 mmol/L (ref 135–145)

## 2021-04-02 LAB — GLUCOSE, CAPILLARY
Glucose-Capillary: 115 mg/dL — ABNORMAL HIGH (ref 70–99)
Glucose-Capillary: 115 mg/dL — ABNORMAL HIGH (ref 70–99)
Glucose-Capillary: 123 mg/dL — ABNORMAL HIGH (ref 70–99)
Glucose-Capillary: 135 mg/dL — ABNORMAL HIGH (ref 70–99)
Glucose-Capillary: 95 mg/dL (ref 70–99)
Glucose-Capillary: 96 mg/dL (ref 70–99)

## 2021-04-02 LAB — CBC
HCT: 29.5 % — ABNORMAL LOW (ref 36.0–46.0)
Hemoglobin: 9.3 g/dL — ABNORMAL LOW (ref 12.0–15.0)
MCH: 30.2 pg (ref 26.0–34.0)
MCHC: 31.5 g/dL (ref 30.0–36.0)
MCV: 95.8 fL (ref 80.0–100.0)
Platelets: 194 10*3/uL (ref 150–400)
RBC: 3.08 MIL/uL — ABNORMAL LOW (ref 3.87–5.11)
RDW: 14 % (ref 11.5–15.5)
WBC: 7.5 10*3/uL (ref 4.0–10.5)
nRBC: 0 % (ref 0.0–0.2)

## 2021-04-02 LAB — PHOSPHORUS: Phosphorus: 3.8 mg/dL (ref 2.5–4.6)

## 2021-04-02 LAB — MAGNESIUM: Magnesium: 2.6 mg/dL — ABNORMAL HIGH (ref 1.7–2.4)

## 2021-04-02 MED ORDER — LORAZEPAM 2 MG/ML PO CONC
1.0000 mg | Freq: Two times a day (BID) | ORAL | Status: DC | PRN
  Administered 2021-04-02 – 2021-04-03 (×2): 1 mg
  Filled 2021-04-02 (×2): qty 1

## 2021-04-02 MED ORDER — DEXMEDETOMIDINE HCL IN NACL 400 MCG/100ML IV SOLN
0.2000 ug/kg/h | INTRAVENOUS | Status: DC
  Administered 2021-04-02: 0.4 ug/kg/h via INTRAVENOUS
  Administered 2021-04-02: 0.8 ug/kg/h via INTRAVENOUS
  Administered 2021-04-03 – 2021-04-05 (×3): 0.5 ug/kg/h via INTRAVENOUS
  Filled 2021-04-02 (×2): qty 100

## 2021-04-02 MED ORDER — AMLODIPINE BESYLATE 10 MG PO TABS
10.0000 mg | ORAL_TABLET | Freq: Every day | ORAL | Status: DC
  Administered 2021-04-02 – 2021-04-04 (×4): 10 mg via ORAL
  Filled 2021-04-02 (×3): qty 1

## 2021-04-02 MED ORDER — HALOPERIDOL LACTATE 5 MG/ML IJ SOLN
2.0000 mg | INTRAMUSCULAR | Status: DC | PRN
  Administered 2021-04-02 – 2021-04-03 (×2): 2 mg via INTRAVENOUS
  Filled 2021-04-02 (×2): qty 1

## 2021-04-02 MED ORDER — CEFAZOLIN SODIUM-DEXTROSE 1-4 GM/50ML-% IV SOLN
1.0000 g | Freq: Three times a day (TID) | INTRAVENOUS | Status: DC
  Administered 2021-04-02 – 2021-04-05 (×8): 1 g via INTRAVENOUS
  Filled 2021-04-02 (×9): qty 50

## 2021-04-02 MED ORDER — HYDRALAZINE HCL 20 MG/ML IJ SOLN: 10.0000 mg | INTRAMUSCULAR | Status: DC | PRN

## 2021-04-02 NOTE — Progress Notes (Signed)
NAME:  Sherry Baker, MRN:  [id number], DOB:  [birth year], LOS: 4 ADMISSION DATE:  03/28/2021, CONSULTATION DATE: 03/29/2021 REFERRING MD: Dr. Sloan Leiter, CHIEF COMPLAINT: Alcohol withdrawal polysubstance abuse  History of Present Illness:   55 year old female, past medical history of polysubstance abuse, alcohol abuse, cocaine abuse, bipolar disease, arthritis, rectal colon cancer status post colostomy.  Patient with a history of cirrhosis.  Patient was found altered unresponsive lying in the floor, to have blood other female people present in the home.  This was concern for known high risk area of illicit activity.  Patient initially presented to the ER with alcohol level of less than 10, urine drug screen positive for cocaine.  On the floor patient  received greater than 20 mg of Ativan today.  Appears to be in full DTs.  Placed on Precedex initially but intubated 5/28 for airway protection  Pertinent  Medical History   Past Medical History:  Diagnosis Date  . Alcohol abuse   . Allergy    PCNS swelling  . Arthritis   . Bipolar 1 disorder ([place])   . Cancer ([place]) 01/21/2017   rectal cancer  . Cancer ([place])   . Cirrhosis of liver ([place])   . Depression   . Genetic testing 03/24/2017   Ms. Fusilier underwent genetic counseling and testing for hereditary cancer syndromes on 02/17/2017. Her results were negative for mutations in all 46 genes analyzed by [facility]'s 46-gene Common Hereditary Cancers Panel. Genes analyzed include: APC, ATM, AXIN2, BARD1, BMPR1A, BRCA1, BRCA2, BRIP1, CDH1, CDKN2A, CHEK2, CTNNA1, DICER1, EPCAM, GREM1, HOXB13, KIT, MEN1, MLH1, MSH2, MSH3, MSH6, MUTYH, NBN,  . Hypertension      Significant Hospital Events: Including procedures, antibiotic start and stop dates in addition to other pertinent events   . 03/29/2021 ICU admission, alcohol withdrawal . 5/28 ETT >> . 5/28 Precedex caused bradycardia , low UO . 5/29 resp cx >> MSSA  Interim History / Subjective:   Remains  critically ill, intubated Sedated on propofol for agitation Improved urine output Hypertensive this a.m.    Objective   Blood pressure (!) 161/87, pulse 83, temperature 99.3 F (37.4 C), temperature source Oral, resp. rate (!) 22, height '5\' 2"'  (1.575 m), weight 48.3 kg, SpO2 100 %.    Vent Mode: PRVC FiO2 (%):  [30 %] 30 % Set Rate:  [22 bmp] 22 bmp Vt Set:  [400 mL] 400 mL PEEP:  [5 cmH20] 5 cmH20 Pressure Support:  [8 cmH20] 8 cmH20 Plateau Pressure:  [13 cmH20] 13 cmH20   Intake/Output Summary (Last 24 hours) at 04/02/2021 1022 Last data filed at 04/02/2021 1000 Gross per 24 hour  Intake 2776.43 ml  Output 3035 ml  Net -258.57 ml   Filed Weights   03/31/21 0517 04/01/21 0413 04/02/21 0500  Weight: 46.6 kg 46.6 kg 48.3 kg    Examination: General: Chronically ill-appearing female, appears older than stated age, thin frail muscle wasting cachectic HENT: No pallor, no icterus, dry mucous membranes Lungs: Bilateral scattered rhonchi, no accessory muscle use Cardiovascular: S1-S2 regular Abdomen: Soft, left lower quadrant ostomy, pink Extremities: Thin extremities, muscle wasting present Neuro: RASS -1, mild agitation when propofol doses lower    Labs/imaging that I havepersonally reviewed  (right click and "Reselect all SmartList Selections" daily)   Labs show improved hypokalemia, decrease creatinine, no leukocytosis, stable anemia  Chest x-ray 5/ 30 independently reviewed shows improved left perihilar infiltrate  resp cx 5/29 >> MSSA  Head CT 5/26 right maxillary sinus opacified  Resolved Hospital Problem list     Assessment & Plan:   Acute metabolic toxic encephalopathy secondary to polysubstance abuse and withdrawal Likely withdrawal from alcohol abuse Unknown possible overdose, Cocaine abuse Plan: Added Ativan 1 twice daily Change from propofol to Precedex, goal RASS 0 to -1, previously had bradycardia Continue as needed fentanyl Continue as needed  Haldol  Acute respiratory failure, intubated for airway protection 5/28 Aspiration pneumonia-MSSA  -Start spontaneous breathing trials but will need better control of agitation before extubating -Change from ceftriaxone to Ancef>> plan for 5 days  AKI -No evidence of rhabdo P: Improving, continue LR at 50 cc an hour   History of colon cancer status post colostomy Plan: Ostomy wound care as needed  Severe protein calorie malnutrition Plan: Core track >> tube feeds to increase to goal At risk for refeeding syndrome , monitor mag and Phos   Best practice (right click and "Reselect all SmartList Selections" daily)  Diet:  Tube Feed  Pain/Anxiety/Delirium protocol (if indicated): Yes (RASS goal 0) VAP protocol (if indicated): Yes DVT prophylaxis: LMWH GI prophylaxis: PPI Glucose control:  SSI No Central venous access:  N/A Arterial line:  N/A Foley:  N/A Mobility:  bed rest  PT consulted: N/A Last date of multidisciplinary goals of care discussion [n/a] updated aunt Barbaraann Share 5/30 she is the only available family member [phone] Code Status:  full code Disposition: ICU   Labs   CBC: Recent Labs  Lab 03/28/21 1317 03/28/21 2320 03/28/21 2352 03/30/21 0153 03/30/21 1949 03/31/21 0459 04/01/21 0215 04/02/21 0201  WBC 5.9 7.9  --  6.8  --  10.5 7.7 7.5  NEUTROABS 5.2  --   --   --   --  8.6*  --   --   HGB 11.8* 9.4*   < > 9.0* 8.8* 9.4* 8.6* 9.3*  HCT 36.0 28.7*   < > 27.2* 26.0* 29.1* 26.7* 29.5*  MCV 91.8 92.6  --  92.2  --  94.5 93.7 95.8  PLT 244 198  --  176  --  180 201 194   < > = values in this interval not displayed.    Basic Metabolic Panel: Recent Labs  Lab 03/28/21 2320 03/28/21 2352 03/30/21 0153 03/30/21 1949 03/31/21 0459 04/01/21 0215 04/02/21 0201  NA 137   < > 143 147* 144 145 143  K 3.6   < > 3.9 3.4* 3.6 3.1* 3.6  CL 107  --  112*  --  113* 116* 112*  CO2 19*  --  22  --  22 21* 22  GLUCOSE 72  --  129*  --  92 98 97  BUN 37*   --  21*  --  27* 23* 21*  CREATININE 1.62*  --  1.40*  --  1.71* 1.49* 1.42*  CALCIUM 9.3  --  9.3  --  8.8* 8.8* 8.8*  MG 2.0  --   --   --  1.9 1.8 2.6*  PHOS 3.5  --   --   --   --  3.3 3.8   < > = values in this interval not displayed.   GFR: Estimated Creatinine Clearance: 34.9 mL/min (A) (by C-G formula based on SCr of 1.42 mg/dL (H)). Recent Labs  Lab 03/30/21 0153 03/31/21 0459 04/01/21 0215 04/02/21 0201  WBC 6.8 10.5 7.7 7.5    Liver Function Tests: Recent Labs  Lab 03/28/21 1317 03/28/21 2320 03/30/21 0153  AST '27 22 18  ' ALT 25  20 15  ALKPHOS 89 60 57  BILITOT 1.2 1.0 0.6  PROT 8.5* 6.7 6.0*  ALBUMIN 3.9 3.1* 2.6*   No results for input(s): LIPASE, AMYLASE in the last 168 hours. Recent Labs  Lab 03/28/21 1317 03/29/21 2002  AMMONIA 19 23    ABG    Component Value Date/Time   PHART 7.398 03/30/2021 1949   PCO2ART 35.8 03/30/2021 1949   PO2ART 98 03/30/2021 1949   HCO3 22.0 03/30/2021 1949   TCO2 23 03/30/2021 1949   ACIDBASEDEF 2.0 03/30/2021 1949   O2SAT 98.0 03/30/2021 1949     Coagulation Profile: No results for input(s): INR, PROTIME in the last 168 hours.  Cardiac Enzymes: Recent Labs  Lab 03/28/21 1551 03/29/21 2002 04/01/21 0215  CKTOTAL 205 219 28*    HbA1C: No results found for: HGBA1C  CBG: Recent Labs  Lab 04/01/21 1638 04/01/21 1913 04/01/21 2343 04/02/21 0409 04/02/21 0714  GLUCAP 108* 98 77 96 115*     Critical care time: [place] MD. FCCP.  Pulmonary & Critical care Pager : [phone]  If no response to pager , please call [phone] until 7 pm After 7:00 pm call Elink  [phone]    04/02/2021 10:22 AM

## 2021-04-02 NOTE — Progress Notes (Signed)
[facility] Progress Note Patient Name: Sherry Baker DOB: 16-Feb-1966 MRN: [id number]   Date of Service  04/02/2021  HPI/Events of Note  Bradycardia - HR in 40's on Precedex IV infusion at 0.7 mcg/kg/hour. Precedex started today to try to wean and extubate the patient. HR now = 61 on Precedex IV infusion at 0.4 mcg/kg/hour.  eICU Interventions  Plan: 1. Decrease Precedex IV infusion to 0.2-0.5 mcg/kg/hour. Titrate to RASS of 0. 2. Use PRN Ativan Q 1 hour and Haldol Q 4 hours as already ordered.      Intervention Category Major Interventions: Arrhythmia - evaluation and management  Sherry Baker 04/02/2021, 11:12 PM

## 2021-04-02 NOTE — Progress Notes (Signed)
TA culture came back with MSSA. Ok to optimize ceftriaxone to cefazolin to complete 7d of therapy per Dr. Elsworth Soho.  Onnie Boer, PharmD, BCIDP, AAHIVP, CPP Infectious Disease Pharmacist 04/02/2021 9:46 AM

## 2021-04-02 NOTE — Progress Notes (Signed)
[facility] Progress Note Patient Name: Sherry Baker DOB: Jun 06, 1966 MRN: [id number]   Date of Service  04/02/2021  HPI/Events of Note  Hypertension - BP = 168/81. Patient is NPO.   eICU Interventions  Plan: 1. Hydralazine 10 mg IV Q 4 hours PRN SBP > 160 or DBP > 100.     Intervention Category Major Interventions: Hypertension - evaluation and management  Colisha Redler [name] 04/02/2021, 10:59 PM

## 2021-04-02 NOTE — Procedures (Signed)
Cortrak  Person Inserting Tube:  Jaliza Seifried, RD Tube Type:  Cortrak - 43 inches Tube Location:  Right nare Initial Placement:  Stomach Secured by: Bridle Technique Used to Measure Tube Placement:  Documented cm marking at nare/ corner of mouth Cortrak Secured At:  67 cm   Cortrak Tube Team Note:  Consult received to place a Cortrak feeding tube.   X-ray is required, abdominal x-ray has been ordered by the Cortrak team. Please confirm tube placement before using the Cortrak tube.   If the tube becomes dislodged please keep the tube and contact the Cortrak team at [email or web address] (password [email or web address]) for replacement.  If after hours and replacement cannot be delayed, place a NG tube and confirm placement with an abdominal x-ray.    Mariana Single RD, LDN Clinical Nutrition Pager listed in [place]

## 2021-04-03 ENCOUNTER — Inpatient Hospital Stay (HOSPITAL_COMMUNITY): Payer: Medicaid Other

## 2021-04-03 DIAGNOSIS — R4182 Altered mental status, unspecified: Secondary | ICD-10-CM | POA: Diagnosis not present

## 2021-04-03 LAB — GLUCOSE, CAPILLARY
Glucose-Capillary: 142 mg/dL — ABNORMAL HIGH (ref 70–99)
Glucose-Capillary: 98 mg/dL (ref 70–99)
Glucose-Capillary: 99 mg/dL (ref 70–99)
Glucose-Capillary: 101 mg/dL — ABNORMAL HIGH (ref 70–99)
Glucose-Capillary: 84 mg/dL (ref 70–99)
Glucose-Capillary: 111 mg/dL — ABNORMAL HIGH (ref 70–99)

## 2021-04-03 LAB — CULTURE, RESPIRATORY W GRAM STAIN

## 2021-04-03 LAB — CBC
HCT: 27.7 % — ABNORMAL LOW (ref 36.0–46.0)
Hemoglobin: 9 g/dL — ABNORMAL LOW (ref 12.0–15.0)
MCH: 30.5 pg (ref 26.0–34.0)
MCHC: 32.5 g/dL (ref 30.0–36.0)
MCV: 93.9 fL (ref 80.0–100.0)
Platelets: 193 10*3/uL (ref 150–400)
RBC: 2.95 MIL/uL — ABNORMAL LOW (ref 3.87–5.11)
RDW: 13.8 % (ref 11.5–15.5)
WBC: 5.4 10*3/uL (ref 4.0–10.5)
nRBC: 0 % (ref 0.0–0.2)

## 2021-04-03 LAB — BASIC METABOLIC PANEL
Anion gap: 14 (ref 5–15)
BUN: 22 mg/dL — ABNORMAL HIGH (ref 6–20)
CO2: 21 mmol/L — ABNORMAL LOW (ref 22–32)
Calcium: 8.9 mg/dL (ref 8.9–10.3)
Chloride: 107 mmol/L (ref 98–111)
Creatinine, Ser: 1.19 mg/dL — ABNORMAL HIGH (ref 0.44–1.00)
GFR, Estimated: 55 mL/min — ABNORMAL LOW (ref >60)
Glucose, Bld: 123 mg/dL — ABNORMAL HIGH (ref 70–99)
Potassium: 3.7 mmol/L (ref 3.5–5.1)
Sodium: 142 mmol/L (ref 135–145)

## 2021-04-03 LAB — MAGNESIUM: Magnesium: 1.8 mg/dL (ref 1.7–2.4)

## 2021-04-03 LAB — PHOSPHORUS: Phosphorus: 4.1 mg/dL (ref 2.5–4.6)

## 2021-04-03 MED ORDER — DIAZEPAM 2 MG PO TABS
5.0000 mg | ORAL_TABLET | Freq: Two times a day (BID) | ORAL | Status: DC
  Administered 2021-04-03 – 2021-04-04 (×4): 5 mg via ORAL
  Filled 2021-04-03 (×4): qty 3

## 2021-04-03 MED ORDER — ATROPINE SULFATE 1 MG/10ML IJ SOSY
PREFILLED_SYRINGE | INTRAMUSCULAR | Status: AC
  Filled 2021-04-03: qty 10

## 2021-04-03 NOTE — Progress Notes (Addendum)
NAME:  Sherry Baker, MRN:  [id number], DOB:  06-04-1966, LOS: 5 ADMISSION DATE:  03/28/2021, CONSULTATION DATE: 03/29/2021 REFERRING MD: Dr. Sloan Leiter, CHIEF COMPLAINT: Alcohol withdrawal polysubstance abuse  History of Present Illness:   55 year old female, past medical history of polysubstance abuse, alcohol abuse, cocaine abuse, bipolar disease, arthritis, rectal colon cancer status post colostomy.  Patient with a history of cirrhosis.  Patient was found altered unresponsive lying in the floor, to have blood other female people present in the home.  This was concern for known high risk area of illicit activity.  Patient initially presented to the ER with alcohol level of less than 10, urine drug screen positive for cocaine.  On the floor patient  received greater than 20 mg of Ativan today.  Appears to be in full DTs.  Placed on Precedex initially but intubated 5/28 for airway protection  Pertinent  Medical History   Past Medical History:  Diagnosis Date  . Alcohol abuse   . Allergy    PCNS swelling  . Arthritis   . Bipolar 1 disorder ([place])   . Cancer ([place]) 01/21/2017   rectal cancer  . Cancer ([place])   . Cirrhosis of liver ([place])   . Depression   . Genetic testing 03/24/2017   Ms. Breault underwent genetic counseling and testing for hereditary cancer syndromes on 02/17/2017. Her results were negative for mutations in all 46 genes analyzed by [facility]'s 46-gene Common Hereditary Cancers Panel. Genes analyzed include: APC, ATM, AXIN2, BARD1, BMPR1A, BRCA1, BRCA2, BRIP1, CDH1, CDKN2A, CHEK2, CTNNA1, DICER1, EPCAM, GREM1, HOXB13, KIT, MEN1, MLH1, MSH2, MSH3, MSH6, MUTYH, NBN,  . Hypertension      Significant Hospital Events: Including procedures, antibiotic start and stop dates in addition to other pertinent events   . 03/29/2021 ICU admission, alcohol withdrawal . 5/28 ETT >> . 5/28 Precedex caused bradycardia , low UO . 5/29 resp cx >> MSSA  Interim History / Subjective:  Overnight was  bradycardic to 40s on Precedex Iv infusion at 0.7 mcg/kg/hr, Hr 61 on 0.4 mcg/kh/hr, and dose further decrease to 0.2 mcg/kg/hr. Pt was intermittently agitated per nursing requiring 2 mg Haldol x2, multiple doses Ativan. Remains critically ill, intubated, on Precedex.    Objective   Blood pressure (!) 185/87, pulse 64, temperature 98.9 F (37.2 C), temperature source Axillary, resp. rate 17, height '5\' 2"'  (1.575 m), weight 48.6 kg, SpO2 100 %.    Vent Mode: PSV;CPAP FiO2 (%):  [30 %] 30 % Set Rate:  [22 bmp] 22 bmp Vt Set:  [400 mL] 400 mL PEEP:  [5 cmH20] 5 cmH20 Pressure Support:  [5 cmH20] 5 cmH20 Plateau Pressure:  [19 cmH20] 19 cmH20   Intake/Output Summary (Last 24 hours) at 04/03/2021 1310 Last data filed at 04/03/2021 1100 Gross per 24 hour  Intake 2343.96 ml  Output 1510 ml  Net 833.96 ml   Filed Weights   04/01/21 0413 04/02/21 0500 04/03/21 0421  Weight: 46.6 kg 48.3 kg 48.6 kg    Examination: General: Chronically ill-appearing female, appears older than stated age, thin frail muscle wasting cachectic, lying in bed with mittens in place,  HENT: No pallor, no icterus, clear saliva on chin and gown Lungs: No accessory muscle use  Cardiovascular: RRR, no m/r/g Abdomen: Soft, non-TTP, left lower quadrant ostomy Extremities: Thin extremities, muscle wasting present Neuro: RASS -1, rouses to touch    Labs/imaging that I havepersonally reviewed  (right click and "Reselect all SmartList Selections" daily)   Labs show improved  hypokalemia, decrease creatinine, no leukocytosis, stable anemia  Chest x-ray 5/ 30 independently reviewed shows improved left perihilar infiltrate  resp cx 5/29 >> MSSA  Head CT 5/26 right maxillary sinus opacified  Resolved Hospital Problem list     Assessment & Plan:   Acute metabolic toxic encephalopathy secondary to polysubstance abuse and withdrawal - Likely withdrawal from alcohol abuse - Unknown possible overdose, - Cocaine  abuse Plan: - Stop Ativan  - Start Valium for longer acting treatment of agitation - Decrease Precedex, goal RASS 0 to -1, previously had bradycardia - Continue Haldol PRN - Continue fentanyl PRN  Acute respiratory failure, intubated for airway protection 5/28 -  Aspiration pneumonia-MSSA - Start spontaneous breathing trials; goal is better control of agitation with Valium before extubating -Change from ceftriaxone to Ancef (5/31)>> plan for 5 days (5/31-6/4)  AKI - No evidence of rhabdo - Cr downtrending Plan: - Improving, continue LR at 50 cc an hour   History of colon cancer status post colostomy Plan: - Ostomy wound care as needed  Severe protein calorie malnutrition Plan: - Core track >> tube feeds to increase to goal - At risk for refeeding syndrome, monitor mag and Phos   Best practice (right click and "Reselect all SmartList Selections" daily)  Diet:  Tube Feed  Pain/Anxiety/Delirium protocol (if indicated): Yes (RASS goal 0) VAP protocol (if indicated): Yes DVT prophylaxis: LMWH GI prophylaxis: PPI Glucose control:  SSI No Central venous access:  N/A Arterial line:  N/A Foley:  N/A Mobility:  bed rest  PT consulted: N/A Last date of multidisciplinary goals of care discussion [n/a] updated aunt Barbaraann Share 5/30 she is the only available family member [phone] Code Status:  full code Disposition: ICU   Labs   CBC: Recent Labs  Lab 03/28/21 1317 03/28/21 2320 03/30/21 0153 03/30/21 1949 03/31/21 0459 04/01/21 0215 04/02/21 0201 04/03/21 0245  WBC 5.9   < > 6.8  --  10.5 7.7 7.5 5.4  NEUTROABS 5.2  --   --   --  8.6*  --   --   --   HGB 11.8*   < > 9.0* 8.8* 9.4* 8.6* 9.3* 9.0*  HCT 36.0   < > 27.2* 26.0* 29.1* 26.7* 29.5* 27.7*  MCV 91.8   < > 92.2  --  94.5 93.7 95.8 93.9  PLT 244   < > 176  --  180 201 194 193   < > = values in this interval not displayed.    Basic Metabolic Panel: Recent Labs  Lab 03/28/21 2320 03/28/21 2352  03/30/21 0153 03/30/21 1949 03/31/21 0459 04/01/21 0215 04/02/21 0201 04/03/21 0245  NA 137   < > 143 147* 144 145 143 142  K 3.6   < > 3.9 3.4* 3.6 3.1* 3.6 3.7  CL 107  --  112*  --  113* 116* 112* 107  CO2 19*  --  22  --  22 21* 22 21*  GLUCOSE 72  --  129*  --  92 98 97 123*  BUN 37*  --  21*  --  27* 23* 21* 22*  CREATININE 1.62*  --  1.40*  --  1.71* 1.49* 1.42* 1.19*  CALCIUM 9.3  --  9.3  --  8.8* 8.8* 8.8* 8.9  MG 2.0  --   --   --  1.9 1.8 2.6* 1.8  PHOS 3.5  --   --   --   --  3.3 3.8 4.1   < > =  values in this interval not displayed.   GFR: Estimated Creatinine Clearance: 41.9 mL/min (A) (by C-G formula based on SCr of 1.19 mg/dL (H)). Recent Labs  Lab 03/31/21 0459 04/01/21 0215 04/02/21 0201 04/03/21 0245  WBC 10.5 7.7 7.5 5.4    Liver Function Tests: Recent Labs  Lab 03/28/21 1317 03/28/21 2320 03/30/21 0153  AST '27 22 18  ' ALT '25 20 15  ' ALKPHOS 89 60 57  BILITOT 1.2 1.0 0.6  PROT 8.5* 6.7 6.0*  ALBUMIN 3.9 3.1* 2.6*   No results for input(s): LIPASE, AMYLASE in the last 168 hours. Recent Labs  Lab 03/28/21 1317 03/29/21 2002  AMMONIA 19 23    ABG    Component Value Date/Time   PHART 7.398 03/30/2021 1949   PCO2ART 35.8 03/30/2021 1949   PO2ART 98 03/30/2021 1949   HCO3 22.0 03/30/2021 1949   TCO2 23 03/30/2021 1949   ACIDBASEDEF 2.0 03/30/2021 1949   O2SAT 98.0 03/30/2021 1949     Coagulation Profile: No results for input(s): INR, PROTIME in the last 168 hours.  Cardiac Enzymes: Recent Labs  Lab 03/28/21 1551 03/29/21 2002 04/01/21 0215  CKTOTAL 205 219 28*    HbA1C: No results found for: HGBA1C  CBG: Recent Labs  Lab 04/02/21 1924 04/02/21 2339 04/03/21 0327 04/03/21 0745 04/03/21 1125  GLUCAP 123* 95 142* 98 99     Critical care time: Betsy Layne, medical student  [facility] Pulmonary & Critical care Pager : [phone]  If no response to pager , please call [phone] until 7 pm After 7:00 pm  call Elink  [phone]    04/03/2021 1:10 PM    --------------------------------------------------  Attending note: I have seen and examined the patient. History, labs and imaging reviewed.  55 Y/O with multiple medical issues admitted with altered mental status, DTs, MSSA PNA, resp failure Remains on the ventilator, Precedex dose reduced due to bradycardia  Blood pressure (!) 185/87, pulse 73, temperature 98.7 F (37.1 C), temperature source Axillary, resp. rate (!) 33, height '5\' 2"'  (1.575 m), weight 48.6 kg, SpO2 100 %. Gen:      No acute distress, chronically ill HEENT:  EOMI, sclera anicteric Neck:     No masses; no thyromegaly, ETT Lungs:    Clear to auscultation bilaterally; normal respiratory effort CV:         Regular rate and rhythm; no murmurs Abd:      + bowel sounds; soft, non-tender; no palpable masses, no distension Ext:    No edema; adequate peripheral perfusion Skin:      Warm and dry; no rash Neuro: Sedated, unresponsive  Labs/Imaging personally reviewed, significant for Labs are stable, no new  Assessment/plan: Acute encephalopathy Wean sedation Start standing valium PRN fentanyl, haldol  Acute resp failure MSSA PNA SBTs as tolerated Continue ancef  AKI Improving. Monitor labs  The patient is critically ill with multiple organ systems failure and requires high complexity decision making for assessment and support, frequent evaluation and titration of therapies, application of advanced monitoring technologies and extensive interpretation of multiple databases.  Critical care time - 35 mins. This represents my time independent of the NPs time taking care of the pt.  Marshell Garfinkel MD  Pulmonary and Critical Care 04/03/2021, 9:46 AM

## 2021-04-04 DIAGNOSIS — F10231 Alcohol dependence with withdrawal delirium: Secondary | ICD-10-CM | POA: Diagnosis not present

## 2021-04-04 LAB — GLUCOSE, CAPILLARY
Glucose-Capillary: 91 mg/dL (ref 70–99)
Glucose-Capillary: 91 mg/dL (ref 70–99)
Glucose-Capillary: 91 mg/dL (ref 70–99)
Glucose-Capillary: 80 mg/dL (ref 70–99)
Glucose-Capillary: 79 mg/dL (ref 70–99)
Glucose-Capillary: 97 mg/dL (ref 70–99)

## 2021-04-04 MED ORDER — OSMOLITE 1.2 CAL PO LIQD
1000.0000 mL | ORAL | Status: DC
  Administered 2021-04-04: 1000 mL
  Filled 2021-04-04 (×2): qty 1000

## 2021-04-04 MED ORDER — AMLODIPINE BESYLATE 10 MG PO TABS: 10.0000 mg | ORAL_TABLET | Freq: Every day | ORAL | Status: DC

## 2021-04-04 MED ORDER — DIAZEPAM 2 MG PO TABS: 5.0000 mg | ORAL_TABLET | Freq: Two times a day (BID) | ORAL | Status: DC

## 2021-04-04 MED ORDER — PHENOL 1.4 % MT LIQD
1.0000 | OROMUCOSAL | Status: DC | PRN
  Filled 2021-04-04: qty 177

## 2021-04-04 NOTE — Progress Notes (Addendum)
NAME:  Sherry Baker, MRN:  [id number], DOB:  1966-10-05, LOS: 6 ADMISSION DATE:  03/28/2021, CONSULTATION DATE: 03/29/2021 REFERRING MD: Dr. Sloan Leiter, CHIEF COMPLAINT: Alcohol withdrawal polysubstance abuse  History of Present Illness:   55 year old female, past medical history of polysubstance abuse, alcohol abuse, cocaine abuse, bipolar disease, arthritis, rectal colon cancer status post colostomy.  Patient with a history of cirrhosis.  Patient was found altered unresponsive lying in the floor, to have blood other female people present in the home.  This was concern for known high risk area of illicit activity.  Patient initially presented to the ER with alcohol level of less than 10, urine drug screen positive for cocaine.  On the floor patient  received greater than 20 mg of Ativan today.  Appears to be in full DTs.  Placed on Precedex initially but intubated 5/28 for airway protection  Pertinent  Medical History   Past Medical History:  Diagnosis Date  . Alcohol abuse   . Allergy    PCNS swelling  . Arthritis   . Bipolar 1 disorder ([place])   . Cancer ([place]) 01/21/2017   rectal cancer  . Cancer ([place])   . Cirrhosis of liver ([place])   . Depression   . Genetic testing 03/24/2017   Ms. Motter underwent genetic counseling and testing for hereditary cancer syndromes on 02/17/2017. Her results were negative for mutations in all 46 genes analyzed by [facility]'s 46-gene Common Hereditary Cancers Panel. Genes analyzed include: APC, ATM, AXIN2, BARD1, BMPR1A, BRCA1, BRCA2, BRIP1, CDH1, CDKN2A, CHEK2, CTNNA1, DICER1, EPCAM, GREM1, HOXB13, KIT, MEN1, MLH1, MSH2, MSH3, MSH6, MUTYH, NBN,  . Hypertension      Significant Hospital Events: Including procedures, antibiotic start and stop dates in addition to other pertinent events   . 03/29/2021 ICU admission, alcohol withdrawal . 5/28 ETT >> . 5/28 Precedex caused bradycardia , low UO . 5/29 resp cx >> MSSA  Interim History / Subjective:  Received PRN  Fentanyl twice overnight, and per nursing report pt was more active than previous night. This morning Sherry Baker is able to nod and shake her head; she shakes her head "no" when asked if she is in pain and if it is hard to breathe.   Objective   Blood pressure 111/72, pulse 65, temperature 99.9 F (37.7 C), temperature source Oral, resp. rate 15, height _0  (1.575 m), weight 48.6 kg, SpO2 99 %.    Vent Mode: PSV;CPAP FiO2 (%):  [30 %] 30 % Set Rate:  [22 bmp] 22 bmp Vt Set:  [400 mL] 400 mL PEEP:  [5 cmH20] 5 cmH20 Pressure Support:  [5 cmH20] 5 cmH20 Plateau Pressure:  [13 cmH20-15 cmH20] 14 cmH20   Intake/Output Summary (Last 24 hours) at 04/04/2021 1252 Last data filed at 04/04/2021 1200 Gross per 24 hour  Intake 2483.16 ml  Output 1295 ml  Net 1188.16 ml   Filed Weights   04/01/21 0413 04/02/21 0500 04/03/21 0421  Weight: 46.6 kg 48.3 kg 48.6 kg    Examination: General: Chronically ill-appearing female, appears older than stated age, thin frail muscle wasting cachectic, lying in bed with mittens in place, opens eyes and sits up independently  HENT: No pallor, no icterus, clear saliva on chin and gown Lungs: No accessory muscle use  Cardiovascular: RRR, no m/r/g Abdomen: Soft, non-TTP, left lower quadrant ostomy w/ surrounding skin non-erythematous, clean, dry, and intact Extremities: Thin extremities, muscle wasting present Neuro: RASS +1, follows commands, moves all 4 extremities  Labs/imaging that I havepersonally reviewed  (right click and "Reselect all SmartList Selections" daily)   Labs show improved hypokalemia, decrease creatinine, no leukocytosis, stable anemia  Chest x-ray 5/ 30 independently reviewed shows improved left perihilar infiltrate  resp cx 5/29 >> MSSA  Head CT 5/26 right maxillary sinus opacified  Resolved Hospital Problem list     Assessment & Plan:   Acute metabolic toxic encephalopathy secondary to polysubstance abuse and withdrawal -  Likely withdrawal from alcohol abuse - Unknown possible overdose, - Cocaine abuse - More alert and calm 6/1 w/ precedex drip and fentanyl Plan: - Continue Valium for longer acting treatment of agitation - Decrease Precedex, goal RASS 0 to -1, previously had bradycardia - Continue Haldol PRN - Continue fentanyl PRN  Acute respiratory failure, intubated for airway protection 5/28 -  Aspiration pneumonia-MSSA - Tolerating spontaneous breathing trials with better control of agitation with Valium Plan: - Extubate today 6/2 - Swallow study - Changed from ceftriaxone to Ancef (5/31)>> plan for 5 days total   AKI - No evidence of rhabdo - Cr downtrending as of last labs 04/03/21 Plan: - Stop LR at 50 cc an hour   History of colon cancer status post colostomy Plan: - Ostomy wound care as needed  Severe protein calorie malnutrition Plan: - Core track >> tube feeds to increase to goal - At risk for refeeding syndrome, monitor mag and Phos    Best practice (right click and "Reselect all SmartList Selections" daily)  Diet:  Tube Feed  Pain/Anxiety/Delirium protocol (if indicated): Yes (RASS goal 0) VAP protocol (if indicated): Yes DVT prophylaxis: LMWH GI prophylaxis: PPI Glucose control:  SSI No Central venous access:  N/A Arterial line:  N/A Foley:  N/A Mobility:  bed rest  PT consulted: N/A Last date of multidisciplinary goals of care discussion [n/a] updated aunt Barbaraann Share 5/30 she is the only available family member [phone] Code Status:  full code Disposition: ICU   Labs   CBC: Recent Labs  Lab 03/28/21 1317 03/28/21 2320 03/30/21 0153 03/30/21 1949 03/31/21 0459 04/01/21 0215 04/02/21 0201 04/03/21 0245  WBC 5.9   < > 6.8  --  10.5 7.7 7.5 5.4  NEUTROABS 5.2  --   --   --  8.6*  --   --   --   HGB 11.8*   < > 9.0* 8.8* 9.4* 8.6* 9.3* 9.0*  HCT 36.0   < > 27.2* 26.0* 29.1* 26.7* 29.5* 27.7*  MCV 91.8   < > 92.2  --  94.5 93.7 95.8 93.9  PLT 244   < > 176   --  180 201 194 193   < > = values in this interval not displayed.    Basic Metabolic Panel: Recent Labs  Lab 03/28/21 2320 03/28/21 2352 03/30/21 0153 03/30/21 1949 03/31/21 0459 04/01/21 0215 04/02/21 0201 04/03/21 0245  NA 137   < > 143 147* 144 145 143 142  K 3.6   < > 3.9 3.4* 3.6 3.1* 3.6 3.7  CL 107  --  112*  --  113* 116* 112* 107  CO2 19*  --  22  --  22 21* 22 21*  GLUCOSE 72  --  129*  --  92 98 97 123*  BUN 37*  --  21*  --  27* 23* 21* 22*  CREATININE 1.62*  --  1.40*  --  1.71* 1.49* 1.42* 1.19*  CALCIUM 9.3  --  9.3  --  8.8* 8.8*  8.8* 8.9  MG 2.0  --   --   --  1.9 1.8 2.6* 1.8  PHOS 3.5  --   --   --   --  3.3 3.8 4.1   < > = values in this interval not displayed.   GFR: Estimated Creatinine Clearance: 41.9 mL/min (A) (by C-G formula based on SCr of 1.19 mg/dL (H)). Recent Labs  Lab 03/31/21 0459 04/01/21 0215 04/02/21 0201 04/03/21 0245  WBC 10.5 7.7 7.5 5.4    Liver Function Tests: Recent Labs  Lab 03/28/21 1317 03/28/21 2320 03/30/21 0153  AST _0 ALT _1 ALKPHOS 89 60 57  BILITOT 1.2 1.0 0.6  PROT 8.5* 6.7 6.0*  ALBUMIN 3.9 3.1* 2.6*   No results for input(s): LIPASE, AMYLASE in the last 168 hours. Recent Labs  Lab 03/28/21 1317 03/29/21 2002  AMMONIA 19 23    ABG    Component Value Date/Time   PHART 7.398 03/30/2021 1949   PCO2ART 35.8 03/30/2021 1949   PO2ART 98 03/30/2021 1949   HCO3 22.0 03/30/2021 1949   TCO2 23 03/30/2021 1949   ACIDBASEDEF 2.0 03/30/2021 1949   O2SAT 98.0 03/30/2021 1949     Coagulation Profile: No results for input(s): INR, PROTIME in the last 168 hours.  Cardiac Enzymes: Recent Labs  Lab 03/28/21 1551 03/29/21 2002 04/01/21 0215  CKTOTAL 205 219 28*    HbA1C: No results found for: HGBA1C  CBG: Recent Labs  Lab 04/03/21 1950 04/03/21 2306 04/04/21 0400 04/04/21 0731 04/04/21 1142  GLUCAP 84 111* 91 91 91     Critical care time: 51 m      Margot Chimes,  medical student  [facility] Pager : [phone]  If no response to pager , please call [phone] until 7 pm After 7:00 pm call Elink  [phone]    04/04/2021 12:52 PM   ------------------------------------------------  Attending note: I have seen and examined the patient. History, labs and imaging reviewed.  55 Y/O with multiple medical issues admitted with altered mental status, DTs, MSSA PNA, resp failure Remains on the ventilator and precedex On SBT today AM and is doing well on 5/5  Blood pressure 131/62, pulse 77, temperature 98.3 F (36.8 C), temperature source Axillary, resp. rate 15, height _2  (1.575 m), weight 48.6 kg, SpO2 100 %. Gen:      No acute distress, chronically ill looking HEENT:  EOMI, sclera anicteric Neck:     No masses; no thyromegaly, ETT Lungs:    Clear to auscultation bilaterally; normal respiratory effort CV:         Regular rate and rhythm; no murmurs Abd:      + bowel sounds; soft, non-tender; no palpable masses, no distension Ext:    No edema; adequate peripheral perfusion Skin:      Warm and dry; no rash Neuro: Awake, responsive  Labs/Imaging personally reviewed, significant for No new labs or imaging  Assessment/plan: Assessment/plan: Acute encephalopathy Wean sedation Continue standing valium PRN fentanyl, haldol  Acute resp failure MSSA, pneumococcus PNA SBTs as tolerated. Likely extubation today Continue ancef  AKI Improving. Monitor labs  The patient is critically ill with multiple organ systems failure and requires high complexity decision making for assessment and support, frequent evaluation and titration of therapies, application of advanced monitoring technologies and extensive interpretation of multiple databases.  Critical care time - 35 mins. This represents my time independent of the NPs time taking  care of the pt.  Marshell Garfinkel MD [facility] 04/04/2021, 8:49  AM

## 2021-04-04 NOTE — Progress Notes (Signed)
Nutrition Follow-up  DOCUMENTATION CODES:   Severe malnutrition in context of chronic illness  INTERVENTION:   Tube feeding via Cortrak: - Change to Osmolite 1.2 @ 60 ml/hr (1440 ml/day)  Tube feeding regimen provides 1728 kcal, 80 grams of protein, and 1181 ml of H2O.  - Continue MVI with minerals daily per tube  - RD will monitor for diet advancement and add oral nutrition supplements as appropriate  NUTRITION DIAGNOSIS:   Severe Malnutrition related to chronic illness (polysubstance abuse, cirrhosis) as evidenced by severe muscle depletion,severe fat depletion.  Ongoing, being addressed via TF  GOAL:   Patient will meet greater than or equal to 90% of their needs  Met via TF  MONITOR:   TF tolerance,Labs  REASON FOR ASSESSMENT:   Consult,Ventilator Enteral/tube feeding initiation and management  ASSESSMENT:   Pt with PMH of ETOH abuse, Bipolar 1 disorder, polysubstance abuse, cirrhosis, depression, HTN, rectal cancer s/p colostomy now admitted after being found unresponsive with acute metabolic toxic encephalopathy secondary to polysubstance abuse and withdrawal. Pt positive for cocaine on admission.  5/28 - intubated for airway protection 5/29 - trickle TF started 5/31 - Cortrak placed, tip gastric 6/02 - extubated  Discussed pt with RN. Pt extubated this morning. RD to adjust tube feeds to better meet pt's needs. RN aware of plan.  Spoke with pt at bedside. Explained need to continue tube feeds via Cortrak. Pt expresses understanding and states that she doesn't weight much at baseline (under 100 lbs).  Admit weight: 46.4 kg Current weight: 48.6 kg  Medications reviewed and include: MVI with minerals daily, protonix, klor-con 20 mEq daily, IV abx  Labs reviewed: BUN 22, creatinine 1.19, hemoglobin 9.0 CBG's: 84-111 x 24 hours  UOP: 1025 ml x 24 hours Colostomy: 230 ml x 24 hours I/O's: +4.5 L since admit  Diet Order:   Diet Order            Diet  NPO time specified  Diet effective now                 EDUCATION NEEDS:   No education needs have been identified at this time  Skin:  Skin Assessment: Skin Integrity Issues: Unstageable: coccyx  Last BM:  04/04/21 230 ml x 24 hours via colostomy  Height:   Ht Readings from Last 1 Encounters:  03/28/21 5' 2" (1.575 m)    Weight:   Wt Readings from Last 1 Encounters:  04/03/21 48.6 kg    Ideal Body Weight:  50 kg  BMI:  Body mass index is 19.6 kg/m.  Estimated Nutritional Needs:   Kcal:  1650-1850  Protein:  70-90 grams  Fluid:  >1.5 L/day    Gustavus Bryant, MS, RD, LDN Inpatient Clinical Dietitian Please see AMiON for contact information.

## 2021-04-04 NOTE — Procedures (Signed)
Extubation Procedure Note  Patient Details:   Name: Sherry Baker DOB: Jun 09, 1966 MRN: [id number]   Airway Documentation:    Vent end date: 04/04/21 Vent end time: 1020   Evaluation  O2 sats: stable throughout Complications: No apparent complications Patient did tolerate procedure well. Bilateral Breath Sounds: Clear,Diminished   Pt extubated to 2L [place] per MD order. Pt had positive cuff leak prior to extubation. No stridor noted. Pt able to voice her name.  Vilinda Blanks 04/04/2021, 10:21 AM

## 2021-04-05 DIAGNOSIS — F1010 Alcohol abuse, uncomplicated: Secondary | ICD-10-CM | POA: Diagnosis not present

## 2021-04-05 LAB — GLUCOSE, CAPILLARY
Glucose-Capillary: 98 mg/dL (ref 70–99)
Glucose-Capillary: 80 mg/dL (ref 70–99)
Glucose-Capillary: 65 mg/dL — ABNORMAL LOW (ref 70–99)
Glucose-Capillary: 117 mg/dL — ABNORMAL HIGH (ref 70–99)
Glucose-Capillary: 54 mg/dL — ABNORMAL LOW (ref 70–99)

## 2021-04-05 LAB — BASIC METABOLIC PANEL
Anion gap: 10 (ref 5–15)
BUN: 30 mg/dL — ABNORMAL HIGH (ref 6–20)
CO2: 21 mmol/L — ABNORMAL LOW (ref 22–32)
Calcium: 9.2 mg/dL (ref 8.9–10.3)
Chloride: 106 mmol/L (ref 98–111)
Creatinine, Ser: 1.06 mg/dL — ABNORMAL HIGH (ref 0.44–1.00)
GFR, Estimated: >60 mL/min (ref >60)
Glucose, Bld: 101 mg/dL — ABNORMAL HIGH (ref 70–99)
Potassium: 4.4 mmol/L (ref 3.5–5.1)
Sodium: 137 mmol/L (ref 135–145)

## 2021-04-05 LAB — CBC
HCT: 27.6 % — ABNORMAL LOW (ref 36.0–46.0)
Hemoglobin: 9 g/dL — ABNORMAL LOW (ref 12.0–15.0)
MCH: 30.3 pg (ref 26.0–34.0)
MCHC: 32.6 g/dL (ref 30.0–36.0)
MCV: 92.9 fL (ref 80.0–100.0)
Platelets: 230 10*3/uL (ref 150–400)
RBC: 2.97 MIL/uL — ABNORMAL LOW (ref 3.87–5.11)
RDW: 13.7 % (ref 11.5–15.5)
WBC: 6.1 10*3/uL (ref 4.0–10.5)
nRBC: 0.3 % — ABNORMAL HIGH (ref 0.0–0.2)

## 2021-04-05 LAB — MAGNESIUM: Magnesium: 1.7 mg/dL (ref 1.7–2.4)

## 2021-04-05 LAB — PHOSPHORUS: Phosphorus: 4.3 mg/dL (ref 2.5–4.6)

## 2021-04-05 MED ORDER — HYDROCODONE-ACETAMINOPHEN 5-325 MG PO TABS
1.0000 | ORAL_TABLET | ORAL | Status: DC | PRN
  Administered 2021-04-05 – 2021-04-07 (×8): 1 via ORAL
  Administered 2021-04-08: 2 via ORAL
  Filled 2021-04-05 (×6): qty 1
  Filled 2021-04-05: qty 2
  Filled 2021-04-05 (×2): qty 1

## 2021-04-05 MED ORDER — BUSPIRONE HCL 5 MG PO TABS
15.0000 mg | ORAL_TABLET | Freq: Two times a day (BID) | ORAL | Status: DC
  Administered 2021-04-05 – 2021-04-08 (×7): 15 mg via ORAL
  Filled 2021-04-05 (×2): qty 3
  Filled 2021-04-05: qty 1
  Filled 2021-04-05: qty 3
  Filled 2021-04-05: qty 1
  Filled 2021-04-05: qty 3
  Filled 2021-04-05: qty 1

## 2021-04-05 MED ORDER — MAGNESIUM SULFATE IN D5W 1-5 GM/100ML-% IV SOLN
1.0000 g | Freq: Once | INTRAVENOUS | Status: AC
  Administered 2021-04-05: 1 g via INTRAVENOUS
  Filled 2021-04-05: qty 100

## 2021-04-05 MED ORDER — LORAZEPAM 2 MG/ML IJ SOLN: 1.0000 mg | INTRAMUSCULAR | Status: DC | PRN

## 2021-04-05 MED ORDER — ENSURE ENLIVE PO LIQD
237.0000 mL | Freq: Three times a day (TID) | ORAL | Status: DC
  Administered 2021-04-05 – 2021-04-08 (×8): 237 mL via ORAL

## 2021-04-05 MED ORDER — CLONIDINE HCL 0.1 MG PO TABS
0.1000 mg | ORAL_TABLET | Freq: Three times a day (TID) | ORAL | Status: DC
  Administered 2021-04-05 – 2021-04-08 (×10): 0.1 mg via ORAL
  Filled 2021-04-05 (×10): qty 1

## 2021-04-05 MED ORDER — GABAPENTIN 100 MG PO CAPS
100.0000 mg | ORAL_CAPSULE | Freq: Three times a day (TID) | ORAL | Status: DC
  Administered 2021-04-05 – 2021-04-08 (×9): 100 mg via ORAL
  Filled 2021-04-05 (×9): qty 1

## 2021-04-05 MED ORDER — PHENOL 1.4 % MT LIQD: 1.0000 | OROMUCOSAL | Status: DC | PRN

## 2021-04-05 MED ORDER — PANTOPRAZOLE SODIUM 40 MG PO PACK
40.0000 mg | PACK | Freq: Every day | ORAL | Status: DC
  Filled 2021-04-05: qty 20

## 2021-04-05 MED ORDER — ACETAMINOPHEN 160 MG/5ML PO SOLN
650.0000 mg | ORAL | Status: DC | PRN
  Administered 2021-04-05: 650 mg
  Filled 2021-04-05: qty 20.3

## 2021-04-05 MED ORDER — CITALOPRAM HYDROBROMIDE 20 MG PO TABS
10.0000 mg | ORAL_TABLET | Freq: Every day | ORAL | Status: DC
  Administered 2021-04-05 – 2021-04-08 (×4): 10 mg via ORAL
  Filled 2021-04-05 (×4): qty 1

## 2021-04-05 MED ORDER — PANTOPRAZOLE SODIUM 40 MG PO TBEC
40.0000 mg | DELAYED_RELEASE_TABLET | Freq: Every day | ORAL | Status: DC
  Administered 2021-04-05 – 2021-04-08 (×4): 40 mg via ORAL
  Filled 2021-04-05 (×3): qty 1

## 2021-04-05 MED ORDER — FENTANYL CITRATE (PF) 100 MCG/2ML IJ SOLN: 12.5000 ug | INTRAMUSCULAR | Status: DC | PRN

## 2021-04-05 MED ORDER — AMLODIPINE BESYLATE 10 MG PO TABS
10.0000 mg | ORAL_TABLET | Freq: Every day | ORAL | Status: DC
  Administered 2021-04-05 – 2021-04-08 (×4): 10 mg via ORAL
  Filled 2021-04-05 (×4): qty 1

## 2021-04-05 MED ORDER — ACETAMINOPHEN 160 MG/5ML PO SOLN: 650.0000 mg | Freq: Four times a day (QID) | ORAL | Status: DC | PRN

## 2021-04-05 NOTE — Progress Notes (Signed)
[facility] Progress Note Patient Name: Sherry Baker DOB: 10-30-1966 MRN: [id number]   Date of Service  04/05/2021  HPI/Events of Note  Patient having a headache and some arthritic knee pains. Fentanyl 25 mcg iv PRN knocks her for a loop.  eICU Interventions  PRN Tylenol via NG tube ordered and PRN  iv Ativan and Fentanyl doses reduced.        Kerry Kass Sadie Hazelett 04/05/2021, 2:31 AM
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
## 2021-04-05 NOTE — Progress Notes (Signed)
Nutrition Follow-up  DOCUMENTATION CODES:   Severe malnutrition in context of chronic illness  INTERVENTION:   - Ensure Enlive po TID, each supplement provides 350 kcal and 20 grams of protein  - Continue MVI with minerals daily  - Encourage adequate PO intake  NUTRITION DIAGNOSIS:   Severe Malnutrition related to chronic illness (polysubstance abuse, cirrhosis) as evidenced by severe muscle depletion,severe fat depletion.  Ongoing, being addressed via diet advancement and oral nutrition supplements  GOAL:   Patient will meet greater than or equal to 90% of their needs  Progressing  MONITOR:   TF tolerance,Labs  REASON FOR ASSESSMENT:   Consult,Ventilator Enteral/tube feeding initiation and management  ASSESSMENT:   Pt with PMH of ETOH abuse, Bipolar 1 disorder, polysubstance abuse, cirrhosis, depression, HTN, rectal cancer s/p colostomy now admitted after being found unresponsive with acute metabolic toxic encephalopathy secondary to polysubstance abuse and withdrawal. Pt positive for cocaine on admission.  5/28 - intubated for airway protection 5/29 - trickle TF started 5/31 - Cortrak placed, tip gastric 6/02 - extubated 6/03 - Cortrak removed, full liquids, later advanced to regular diet  Discussed pt with RN and during ICU rounds. Pt doing well today and Cortrak removed.  Spoke with pt at bedside. Pt had just consumed orange sherbet and orange juice due to low CBG. Pt loves Ensure and is willing to consume these during admission. RD provided pt with chocolate Ensure Enlive at time of visit. RN aware.  Admit weight: 46.4 kg Current weight: 48 kg  Meal Completion: 100% x 1 full liquid meal  Medications reviewed and include: MVI with minerals daily, protonix  Labs reviewed: BUN 30, creatinine 1.06, hemoglobin 9.0 CBG's: 54-117 x 24 hours  UOP: 1700 ml x 24 hours Colostomy: 80 ml x 24 hours I/O's: +5.5 L since admit  Diet Order:   Diet Order             Diet regular Room service appropriate? Yes; Fluid consistency: Thin  Diet effective now                 EDUCATION NEEDS:   No education needs have been identified at this time  Skin:  Skin Assessment: Skin Integrity Issues: Unstageable: coccyx  Last BM:  04/05/21 80 ml x 24 hours via colostomy  Height:   Ht Readings from Last 1 Encounters:  03/28/21 5\' 2"  (1.575 m)    Weight:   Wt Readings from Last 1 Encounters:  04/05/21 48 kg    Ideal Body Weight:  50 kg  BMI:  Body mass index is 19.35 kg/m.  Estimated Nutritional Needs:   Kcal:  1650-1850  Protein:  70-90 grams  Fluid:  >1.5 L/day    Gustavus Bryant, MS, RD, LDN Inpatient Clinical Dietitian Please see AMiON for contact information.
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
## 2021-04-05 NOTE — Consult Note (Signed)
[facility] Nurse Consult Note: Patient receiving care in [facility] 3M14. Reason for Consult: "unstageable coccyx" wound Wound type: non healing surgical wound; apparent APR with closure of the rectum which is, and has been for many months, a non healing full thick wound with epibole. Pressure Injury POA: Yes/No/NA Measurement: 4 cm x 1 cm x 0.3 cm Wound bed: pink Drainage (amount, consistency, odor) none Periwound: intact Dressing procedure/placement/frequency: Place a narrow piece of Aquacel Kellie Simmering [phone]) over the pink wound in the coccyx area, cover with a foam dressing. Change the aquacel daily. The foam dressing can remain in place up to 3 days if unsoiled.  [facility] Nurse ostomy follow up Stoma type/location: LUQ colostomy Stomal assessment/size: 1 3/8 inches, round, pink, moist Peristomal assessment: deferred--supplies must be obtained Treatment options for stomal/peristomal skin: barrier ring Output: brand new fecal collection pouch over stoma until ostomy supplies obtained, I did not see the stool.  Ostomy pouching: 1pc. Pouch, Kellie Simmering [phone] and barrier rings, Kellie Simmering [phone] This is a long standing ostomy and the patient is independent, usually, with ostomy care and obtaining supplies. [facility] nurse will not follow at this time.  Please re-consult the [facility] team if needed.  Val Riles, RN, MSN, CWOCN, CNS-BC, pager [phone]

## 2021-04-05 NOTE — Progress Notes (Signed)
[facility] Progress Note Patient Name: Sherry Baker DOB: 1965/12/24 MRN: [id number]   Date of Service  04/05/2021  HPI/Events of Note  Patient has a sore throat s/p extubation.  eICU Interventions  PRN Chloraseptic spray ordered.        Kerry Kass Dondrell Loudermilk 04/05/2021, 12:03 AM

## 2021-04-06 DIAGNOSIS — D649 Anemia, unspecified: Secondary | ICD-10-CM

## 2021-04-06 DIAGNOSIS — R4182 Altered mental status, unspecified: Secondary | ICD-10-CM | POA: Diagnosis not present

## 2021-04-06 DIAGNOSIS — K219 Gastro-esophageal reflux disease without esophagitis: Secondary | ICD-10-CM

## 2021-04-06 DIAGNOSIS — N1831 Chronic kidney disease, stage 3a: Secondary | ICD-10-CM | POA: Diagnosis not present

## 2021-04-06 DIAGNOSIS — F10231 Alcohol dependence with withdrawal delirium: Secondary | ICD-10-CM | POA: Diagnosis not present

## 2021-04-06 DIAGNOSIS — F1410 Cocaine abuse, uncomplicated: Secondary | ICD-10-CM | POA: Diagnosis not present

## 2021-04-06 LAB — GLUCOSE, CAPILLARY
Glucose-Capillary: 74 mg/dL (ref 70–99)
Glucose-Capillary: 87 mg/dL (ref 70–99)
Glucose-Capillary: 85 mg/dL (ref 70–99)

## 2021-04-06 NOTE — Progress Notes (Signed)
PROGRESS NOTE  Sherry Baker JYN:[id number] DOB: 09-Oct-1966 DOA: 03/28/2021 PCP: Default, Provider, MD   LOS: 8 days   Brief narrative:  55 year old female, past medical history of polysubstance abuse, alcohol abuse, cocaine abuse, bipolar disease, arthritis, rectal colon cancer status post colostomy, history of cirrhosis of liver was found unresponsive on the floor.  In the ED, alcohol level was less than 10 and drug screen was positive for cocaine.  Patient was admitted to hospital for possible alcohol withdrawal symptom and received more than 20 mg of Ativan so was put in the ICU for Precedex drip.  Was intubated on 03/30/2021 for airway protection.  She was in delirium tremens.  Subsequently, patient has been considered stable for transfer out of the ICU.  Assessment/Plan:  Principal Problem:   Alcohol withdrawal ([place]) Active Problems:   Cocaine abuse (HCC)   Rectal cancer (HCC)   Hypertension   GERD (gastroesophageal reflux disease)   CKD (chronic kidney disease), stage III (HCC)   Normochromic normocytic anemia   Altered mental status  Acute metabolic toxic encephalopathy  secondary to polysubstance abuse and alcohol withdrawal.  Transition of care on board.  We will continue to monitor closely.   Severe alcohol withdrawal symptoms delirium tremens.  Was in the ICU for Precedex drip.  Seen by psychiatry as well.  We will follow recommendation.  Has been started on gabapentin as well. On  As needed Ativan.  Bipolar Disorder, Type 1 Has been seen by psychiatry.  Has used buspirone, clonidine, hydroxyzine, and ziprasidone in the past. continue buspirone, Celexa, gabapentin  Acute respiratory failure  secondary to aspiration pneumonia Patient was intubated for airway protection and extubated on 04/04/2021.  Patient has completed 5-day course of Ancef for methicillin sensitive staph aureus on tracheal aspirate.  AKI Improving creatinine levels.  Continue to monitor closely.  Check  BMP in AM.  History of colon cancer status post colostomy Stable.  Continue colostomy care.  Severe protein calorie malnutrition Present on admission.  Was initially on tube feeds.  On oral diet at this time.  Patient is likely at risk for refeeding syndrome.  Closely monitor magnesium phosphorus and electrolytes and replenish as necessary.  Deconditioning, debility.- PT has been consulted who recommend skilled nursing facility placement on discharge.   DVT prophylaxis: heparin injection 5,000 Units Start: 03/29/21 2200 SCDs Start: 03/28/21 2204    Code Status: Full code  Family Communication: None  Status is: Inpatient  Remains inpatient appropriate because:IV treatments appropriate due to intensity of illness or inability to take PO, Inpatient level of care appropriate due to severity of illness and Need for rehabilitation   Dispo: The patient is from: Home              Anticipated d/c is to: SNF              Patient currently is not medically stable to d/c.   Difficult to place patient No    Consultants:  PCCM  Psychiatry  Procedures:  Intubation mechanical ventilation,   extubation  Anti-infectives:  Sharren [place]- 6/ 3/22  Anti-infectives (From admission, onward)   Start     Dose/Rate Route Frequency Ordered Stop   04/02/21 2200  ceFAZolin (ANCEF) IVPB 1 g/50 mL premix  Status:  Discontinued        1 g 100 mL/hr over 30 Minutes Intravenous Every 8 hours 04/02/21 0941 04/05/21 0930   03/31/21 0930  cefTRIAXone (ROCEPHIN) 2 g in sodium chloride 0.9 % 100 mL  IVPB  Status:  Discontinued        2 g 200 mL/hr over 30 Minutes Intravenous Every 24 hours 03/31/21 0840 04/02/21 0941       Subjective: Today, patient was seen and examined at bedside.  She denies any nausea vomiting abdominal pain.  Denies any tremors hallucinations shortness of breath.  Objective: Vitals:   04/06/21 0336 04/06/21 0600  BP:  95/67  Pulse:  70  Resp:  17  Temp: 98.9 F  (37.2 C)   SpO2:  100%    Intake/Output Summary (Last 24 hours) at 04/06/2021 0732 Last data filed at 04/06/2021 0533 Gross per 24 hour  Intake 1460 ml  Output 250 ml  Net 1210 ml   Filed Weights   04/02/21 0500 04/03/21 0421 04/05/21 0500  Weight: 48.3 kg 48.6 kg 48 kg   Body mass index is 19.35 kg/m.   Physical Exam: GENERAL: Chronically ill, appears older than stated age, thinly built, frail, Patient is alert awake and oriented. Not in obvious distress. HENT: No scleral pallor or icterus. Pupils equally reactive to light. Oral mucosa is moist NECK: is supple, no gross swelling noted. CHEST: Clear to auscultation. No crackles or wheezes.  Diminished breath sounds bilaterally. CVS: S1 and S2 heard, no murmur. Regular rate and rhythm.  ABDOMEN: Soft, non-tender, bowel sounds are present.  Stoma bag in place. EXTREMITIES: No edema.  Muscle wasting noted. CNS: Cranial nerves are intact. No focal motor deficits. SKIN: warm and dry without rashes.  Data Review: I have personally reviewed the following laboratory data and studies,  CBC: Recent Labs  Lab 03/31/21 0459 04/01/21 0215 04/02/21 0201 04/03/21 0245 04/05/21 0239  WBC 10.5 7.7 7.5 5.4 6.1  NEUTROABS 8.6*  --   --   --   --   HGB 9.4* 8.6* 9.3* 9.0* 9.0*  HCT 29.1* 26.7* 29.5* 27.7* 27.6*  MCV 94.5 93.7 95.8 93.9 92.9  PLT 180 201 194 193 706   Basic Metabolic Panel: Recent Labs  Lab 03/31/21 0459 04/01/21 0215 04/02/21 0201 04/03/21 0245 04/05/21 0239  NA 144 145 143 142 137  K 3.6 3.1* 3.6 3.7 4.4  CL 113* 116* 112* 107 106  CO2 22 21* 22 21* 21*  GLUCOSE 92 98 97 123* 101*  BUN 27* 23* 21* 22* 30*  CREATININE 1.71* 1.49* 1.42* 1.19* 1.06*  CALCIUM 8.8* 8.8* 8.8* 8.9 9.2  MG 1.9 1.8 2.6* 1.8 1.7  PHOS  --  3.3 3.8 4.1 4.3   Liver Function Tests: No results for input(s): AST, ALT, ALKPHOS, BILITOT, PROT, ALBUMIN in the last 168 hours. No results for input(s): LIPASE, AMYLASE in the last 168  hours. No results for input(s): AMMONIA in the last 168 hours. Cardiac Enzymes: Recent Labs  Lab 04/01/21 0215  CKTOTAL 28*   BNP (last 3 results) Recent Labs    03/30/21 0153  BNP 231.8*    ProBNP (last 3 results) No results for input(s): PROBNP in the last 8760 hours.  CBG: Recent Labs  Lab 04/05/21 0342 04/05/21 0713 04/05/21 1102 04/05/21 1201 04/05/21 1204  GLUCAP 98 80 65* 54* 117*   Recent Results (from the past 240 hour(s))  Resp Panel by RT-PCR (Flu A&B, Covid) Nasopharyngeal Swab     Status: None   Collection Time: 03/28/21 11:42 AM   Specimen: Nasopharyngeal Swab; Nasopharyngeal(NP) swabs in vial transport medium  Result Value Ref Range Status   SARS Coronavirus 2 by RT PCR NEGATIVE NEGATIVE Final  Comment: (NOTE) SARS-CoV-2 target nucleic acids are NOT DETECTED.  The SARS-CoV-2 RNA is generally detectable in upper respiratory specimens during the acute phase of infection. The lowest concentration of SARS-CoV-2 viral copies this assay can detect is 138 copies/mL. A negative result does not preclude SARS-Cov-2 infection and should not be used as the sole basis for treatment or other patient management decisions. A negative result may occur with  improper specimen collection/handling, submission of specimen other than nasopharyngeal swab, presence of viral mutation(s) within the areas targeted by this assay, and inadequate number of viral copies(<138 copies/mL). A negative result must be combined with clinical observations, patient history, and epidemiological information. The expected result is Negative.  Fact Sheet for Patients:  EntrepreneurPulse.com.au  Fact Sheet for Healthcare Providers:  IncredibleEmployment.be  This test is no t yet approved or cleared by the [place] FDA and  has been authorized for detection and/or diagnosis of SARS-CoV-2 by FDA under an Emergency Use Authorization (EUA). This EUA  will remain  in effect (meaning this test can be used) for the duration of the COVID-19 declaration under Section 564(b)(1) of the Act, 21 U.S.C.section 360bbb-3(b)(1), unless the authorization is terminated  or revoked sooner.       Influenza A by PCR NEGATIVE NEGATIVE Final   Influenza B by PCR NEGATIVE NEGATIVE Final    Comment: (NOTE) The Xpert Xpress SARS-CoV-2/FLU/RSV plus assay is intended as an aid in the diagnosis of influenza from Nasopharyngeal swab specimens and should not be used as a sole basis for treatment. Nasal washings and aspirates are unacceptable for Xpert Xpress SARS-CoV-2/FLU/RSV testing.  Fact Sheet for Patients: EntrepreneurPulse.com.au  Fact Sheet for Healthcare Providers: IncredibleEmployment.be  This test is not yet approved or cleared by the [place] FDA and has been authorized for detection and/or diagnosis of SARS-CoV-2 by FDA under an Emergency Use Authorization (EUA). This EUA will remain in effect (meaning this test can be used) for the duration of the COVID-19 declaration under Section 564(b)(1) of the Act, 21 U.S.C. section 360bbb-3(b)(1), unless the authorization is terminated or revoked.  Performed at [facility], [place] [address] [place], [place] [address]   MRSA PCR Screening     Status: None   Collection Time: 03/29/21 10:26 PM   Specimen: Nasal Mucosa; Nasopharyngeal  Result Value Ref Range Status   MRSA by PCR NEGATIVE NEGATIVE Final    Comment:        The GeneXpert MRSA Assay (FDA approved for NASAL specimens only), is one component of a comprehensive MRSA colonization surveillance program. It is not intended to diagnose MRSA infection nor to guide or monitor treatment for MRSA infections. Performed at [facility], [place] [address] [place], [place] [address]   Culture, Respiratory w Gram Stain     Status: None   Collection Time: 03/31/21  8:19 AM   Specimen: Tracheal  Aspirate; Respiratory  Result Value Ref Range Status   Specimen Description TRACHEAL ASPIRATE  Final   Special Requests NONE  Final   Gram Stain   Final    RARE WBC PRESENT, PREDOMINANTLY PMN FEW GRAM POSITIVE COCCI RARE GRAM NEGATIVE RODS Performed at [facility], [place] [address], [place], [place] [address]    Culture   Final    ABUNDANT STAPHYLOCOCCUS AUREUS FEW STREPTOCOCCUS PNEUMONIAE    Report Status 04/03/2021 FINAL  Final   Organism ID, Bacteria STAPHYLOCOCCUS AUREUS  Final   Organism ID, Bacteria STREPTOCOCCUS PNEUMONIAE  Final  Susceptibility   Staphylococcus aureus - MIC*    CIPROFLOXACIN <=0.5 SENSITIVE Sensitive     ERYTHROMYCIN <=0.25 SENSITIVE Sensitive     GENTAMICIN <=0.5 SENSITIVE Sensitive     OXACILLIN 0.5 SENSITIVE Sensitive     TETRACYCLINE <=1 SENSITIVE Sensitive     VANCOMYCIN <=0.5 SENSITIVE Sensitive     TRIMETH/SULFA <=10 SENSITIVE Sensitive     CLINDAMYCIN <=0.25 SENSITIVE Sensitive     RIFAMPIN <=0.5 SENSITIVE Sensitive     Inducible Clindamycin NEGATIVE Sensitive     * ABUNDANT STAPHYLOCOCCUS AUREUS   Streptococcus pneumoniae - MIC*    LEVOFLOXACIN 0.5 SENSITIVE Sensitive     VANCOMYCIN 0.5 SENSITIVE Sensitive     PENICILLIN (meningitis) 1 RESISTANT Resistant     PENO - penicillin 1      PENICILLIN (non-meningitis) 1 SENSITIVE Sensitive     PENICILLIN (oral) 1 INTERMEDIATE Intermediate     CEFTRIAXONE (non-meningitis) 1 SENSITIVE Sensitive     CEFTRIAXONE (meningitis) 1 INTERMEDIATE Intermediate     * FEW STREPTOCOCCUS PNEUMONIAE     Studies: No results found.    Flora Lipps, MD  [facility] 04/06/2021  If 7PM-7AM, please contact night-coverage

## 2021-04-07 DIAGNOSIS — N1831 Chronic kidney disease, stage 3a: Secondary | ICD-10-CM | POA: Diagnosis not present

## 2021-04-07 DIAGNOSIS — F1410 Cocaine abuse, uncomplicated: Secondary | ICD-10-CM | POA: Diagnosis not present

## 2021-04-07 DIAGNOSIS — F10231 Alcohol dependence with withdrawal delirium: Secondary | ICD-10-CM | POA: Diagnosis not present

## 2021-04-07 DIAGNOSIS — K219 Gastro-esophageal reflux disease without esophagitis: Secondary | ICD-10-CM | POA: Diagnosis not present

## 2021-04-07 NOTE — TOC Progression Note (Signed)
Transition of Care [facility]) - Progression Note    Patient Details  Name: Sherry Baker MRN: [id number] Date of Birth: 1966-09-07  [facility] CM/SW Contact  Carles Collet, RN Phone Number: 04/07/2021, 1:07 PM  Clinical Narrative:    Patient form home with potential needs for home PT.  Per ED notes, "brought to the emergency department via EMS due to altered mental status.  According to EMS when they arrived the patient was altered, lying on the floor with dried blood and there were multiple males at home.  There were numerous beer cans all over the house and it appeared to the EMS crew that everyone present in the house was intoxicated."  Due to payor source and active illegal drug use, UDS + cocaine, TOC unable to establish home health services.  TOC will continue to be available for medication, DME, and other needs for DC.      Expected Discharge Plan: Home/Self Care Barriers to Discharge: Continued Medical Work up  Expected Discharge Plan and Services Expected Discharge Plan: Home/Self Care In-house Referral: Clinical Social Work     Living arrangements for the past 2 months: Apartment                                       Social Determinants of Health (SDOH) Interventions    Readmission Risk Interventions No flowsheet data found.

## 2021-04-07 NOTE — Progress Notes (Signed)
PROGRESS NOTE  Sherry Baker SWH:[id number] DOB: January 22, 1966 DOA: 03/28/2021 PCP: Default, Provider, MD   LOS: 9 days   Brief narrative:  55 year old female, past medical history of polysubstance abuse, alcohol abuse, cocaine abuse, bipolar disease, arthritis, rectal colon cancer status post colostomy, history of cirrhosis of liver was found unresponsive on the floor.  In the ED, alcohol level was less than 10 and drug screen was positive for cocaine.  Patient was admitted to hospital for possible alcohol withdrawal symptom and received more than 20 mg of Ativan so was put in the ICU for Precedex drip.  Was intubated on 03/30/2021 for airway protection.  She was in delirium tremens.  Subsequently, patient has been considered stable for transfer out of the ICU.  Assessment/Plan:  Principal Problem:   Alcohol withdrawal ([place]) Active Problems:   Cocaine abuse (HCC)   Rectal cancer (HCC)   Hypertension   GERD (gastroesophageal reflux disease)   CKD (chronic kidney disease), stage III (HCC)   Normochromic normocytic anemia   Altered mental status  Acute metabolic toxic encephalopathy  secondary to polysubstance abuse and alcohol withdrawal.  Transition of care on board and will provide resources on discharge.    Severe alcohol withdrawal symptoms delirium tremens.  Was in the ICU for Precedex drip.  Seen by psychiatry as well.   Has been started on gabapentin , continue as needed Ativan.  No active withdrawal noted  Bipolar Disorder, Type 1 Has been seen by psychiatry.  Has used buspirone, clonidine, hydroxyzine, and ziprasidone in the past. Continue buspirone, Celexa, gabapentin  Acute respiratory failure  secondary to aspiration pneumonia Patient was intubated for airway protection and extubated on 04/04/2021.  Patient has completed 5-day course of Ancef for methicillin sensitive staph aureus on tracheal aspirate.  AKI Resolved.  History of colon cancer status post colostomy Stable.   Continue colostomy care.  Severe protein calorie malnutrition Present on admission.  Was initially on tube feeds.  On oral diet at this time.  Patient is likely at risk for refeeding syndrome.  Closely monitor magnesium, phosphorus and electrolytes and replenish as necessary.  Deconditioning, debility.- PT has been consulted who recommend skilled nursing facility placement on discharge.  At this time patient has refused skilled nursing facility placement.  We will need to arrange for home health on discharge.   DVT prophylaxis: heparin injection 5,000 Units Start: 03/29/21 2200 SCDs Start: 03/28/21 2204    Code Status: Full code  Family Communication: None  Status is: Inpatient  Remains inpatient appropriate because:IV treatments appropriate due to intensity of illness or inability to take PO, Inpatient level of care appropriate due to severity of illness and Need for rehabilitation   Dispo: The patient is from: Home              Anticipated d/c is to: Home health PT on discharge              Patient currently is not medically stable to d/c.   Difficult to place patient No    Consultants:  PCCM  Psychiatry  Procedures:  Intubation mechanical ventilation,   extubation  Anti-infectives:  Sharren [place]- 6/ 3/22  Anti-infectives (From admission, onward)   Start     Dose/Rate Route Frequency Ordered Stop   04/02/21 2200  ceFAZolin (ANCEF) IVPB 1 g/50 mL premix  Status:  Discontinued        1 g 100 mL/hr over 30 Minutes Intravenous Every 8 hours 04/02/21 0941 04/05/21 0930   03/31/21  0930  cefTRIAXone (ROCEPHIN) 2 g in sodium chloride 0.9 % 100 mL IVPB  Status:  Discontinued        2 g 200 mL/hr over 30 Minutes Intravenous Every 24 hours 03/31/21 0840 04/02/21 0941      Subjective: Today, patient was seen and examined at bedside.  Denies any nausea vomiting dizziness lightheadedness shortness of breath hallucinations or tremors.  Objective: Vitals:    04/07/21 0420 04/07/21 1354  BP: (!) 96/57 105/70  Pulse: 76 78  Resp: 18 16  Temp: 98.4 F (36.9 C) 98.5 F (36.9 C)  SpO2: 95% 98%    Intake/Output Summary (Last 24 hours) at 04/07/2021 1426 Last data filed at 04/07/2021 1130 Gross per 24 hour  Intake 264 ml  Output 660 ml  Net -396 ml   Filed Weights   04/03/21 0421 04/05/21 0500 04/07/21 0420  Weight: 48.6 kg 48 kg 48.9 kg   Body mass index is 19.74 kg/m.   Physical Exam: GENERAL: Chronically ill, appears older than stated age, thinly built Patient is alert awake and oriented. Not in obvious distress. HENT: No scleral pallor or icterus. Pupils equally reactive to light. Oral mucosa is moist NECK: is supple, no gross swelling noted. CHEST: Clear to auscultation. No crackles or wheezes.  Diminished breath sounds bilaterally. CVS: S1 and S2 heard, no murmur. Regular rate and rhythm.  ABDOMEN: Soft, non-tender, bowel sounds are present.  Stoma bag in place. EXTREMITIES: No edema.  Muscle wasting noted. CNS: Cranial nerves are intact. No focal motor deficits. SKIN: warm and dry without rashes.  Data Review: I have personally reviewed the following laboratory data and studies,  CBC: Recent Labs  Lab 04/01/21 0215 04/02/21 0201 04/03/21 0245 04/05/21 0239  WBC 7.7 7.5 5.4 6.1  HGB 8.6* 9.3* 9.0* 9.0*  HCT 26.7* 29.5* 27.7* 27.6*  MCV 93.7 95.8 93.9 92.9  PLT 201 194 193 161   Basic Metabolic Panel: Recent Labs  Lab 04/01/21 0215 04/02/21 0201 04/03/21 0245 04/05/21 0239  NA 145 143 142 137  K 3.1* 3.6 3.7 4.4  CL 116* 112* 107 106  CO2 21* 22 21* 21*  GLUCOSE 98 97 123* 101*  BUN 23* 21* 22* 30*  CREATININE 1.49* 1.42* 1.19* 1.06*  CALCIUM 8.8* 8.8* 8.9 9.2  MG 1.8 2.6* 1.8 1.7  PHOS 3.3 3.8 4.1 4.3   Liver Function Tests: No results for input(s): AST, ALT, ALKPHOS, BILITOT, PROT, ALBUMIN in the last 168 hours. No results for input(s): LIPASE, AMYLASE in the last 168 hours. No results for input(s):  AMMONIA in the last 168 hours. Cardiac Enzymes: Recent Labs  Lab 04/01/21 0215  CKTOTAL 28*   BNP (last 3 results) Recent Labs    03/30/21 0153  BNP 231.8*    ProBNP (last 3 results) No results for input(s): PROBNP in the last 8760 hours.  CBG: Recent Labs  Lab 04/05/21 1201 04/05/21 1204 04/06/21 0756 04/06/21 1227 04/06/21 1607  GLUCAP 54* 117* 74 87 85   Recent Results (from the past 240 hour(s))  MRSA PCR Screening     Status: None   Collection Time: 03/29/21 10:26 PM   Specimen: Nasal Mucosa; Nasopharyngeal  Result Value Ref Range Status   MRSA by PCR NEGATIVE NEGATIVE Final    Comment:        The GeneXpert MRSA Assay (FDA approved for NASAL specimens only), is one component of a comprehensive MRSA colonization surveillance program. It is not intended to diagnose MRSA infection nor to  guide or monitor treatment for MRSA infections. Performed at [facility], [place] [address] [place], [place] [address]   Culture, Respiratory w Gram Stain     Status: None   Collection Time: 03/31/21  8:19 AM   Specimen: Tracheal Aspirate; Respiratory  Result Value Ref Range Status   Specimen Description TRACHEAL ASPIRATE  Final   Special Requests NONE  Final   Gram Stain   Final    RARE WBC PRESENT, PREDOMINANTLY PMN FEW GRAM POSITIVE COCCI RARE GRAM NEGATIVE RODS Performed at [facility], [place] [address], [place], [place] [address]    Culture   Final    ABUNDANT STAPHYLOCOCCUS AUREUS FEW STREPTOCOCCUS PNEUMONIAE    Report Status 04/03/2021 FINAL  Final   Organism ID, Bacteria STAPHYLOCOCCUS AUREUS  Final   Organism ID, Bacteria STREPTOCOCCUS PNEUMONIAE  Final      Susceptibility   Staphylococcus aureus - MIC*    CIPROFLOXACIN <=0.5 SENSITIVE Sensitive     ERYTHROMYCIN <=0.25 SENSITIVE Sensitive     GENTAMICIN <=0.5 SENSITIVE Sensitive     OXACILLIN 0.5 SENSITIVE Sensitive     TETRACYCLINE <=1 SENSITIVE Sensitive     VANCOMYCIN <=0.5 SENSITIVE  Sensitive     TRIMETH/SULFA <=10 SENSITIVE Sensitive     CLINDAMYCIN <=0.25 SENSITIVE Sensitive     RIFAMPIN <=0.5 SENSITIVE Sensitive     Inducible Clindamycin NEGATIVE Sensitive     * ABUNDANT STAPHYLOCOCCUS AUREUS   Streptococcus pneumoniae - MIC*    LEVOFLOXACIN 0.5 SENSITIVE Sensitive     VANCOMYCIN 0.5 SENSITIVE Sensitive     PENICILLIN (meningitis) 1 RESISTANT Resistant     PENO - penicillin 1      PENICILLIN (non-meningitis) 1 SENSITIVE Sensitive     PENICILLIN (oral) 1 INTERMEDIATE Intermediate     CEFTRIAXONE (non-meningitis) 1 SENSITIVE Sensitive     CEFTRIAXONE (meningitis) 1 INTERMEDIATE Intermediate     * FEW STREPTOCOCCUS PNEUMONIAE     Studies: No results found.    Flora Lipps, MD  [facility] 04/07/2021  If 7PM-7AM, please contact night-coverage

## 2021-04-07 NOTE — TOC Initial Note (Addendum)
Transition of Care [facility]) - Initial/Assessment Note    Patient Details  Name: Sherry Baker MRN: [id number] Date of Birth: 02-Jul-1966  Transition of Care [facility]) CM/SW Contact:    Gabrielle Dare Phone Number: 04/07/2021, 11:26 AM  Clinical Narrative:                 CSW spoke with pt by phone.   CSW spoke with pt concerning recommendations from PT for SNF services.  Pt has decline SNF offering.  Pt has lived with her roommate for a year in a one-level apartment. Pt feels safe to return to her place of residence and would like to receive home Health with PT services.  Pt has not been vaccinated for covid. Pt does not currently have a cellphone at this time.  CSW has recommended that she consults with her case mgr at DSS for information on receiving a cell phone for safety.  CSW offered pt substance abuse resources.  Pt decline resources.  Pt wants to move on from past.   CSW will update case NCM Debbie with update and physician. TOC will continue to assist with disposition planning.  Expected Discharge Plan: [place] Barriers to Discharge: Continued Medical Work up   Patient Goals and CMS Choice Patient states their goals for this hospitalization and ongoing recovery are:: "to be independent" CMS Medicare.gov Compare Post Acute Care list provided to:: Other (Comment Required) (pt does not want to go to SNF prefers home health.) Choice offered to / list presented to : Patient  Expected Discharge Plan and Services Expected Discharge Plan: [place] In-house Referral: Clinical Social Work     Living arrangements for the past 2 months: Apartment                                      Prior Living Arrangements/Services Living arrangements for the past 2 months: Apartment Lives with:: Roommate Patient language and need for interpreter reviewed:: Yes Do you feel safe going back to the place where you live?: Yes      Need for Family  Participation in Patient Care: No (Comment) Care giver support system in place?: Yes (comment)   Criminal Activity/Legal Involvement Pertinent to Current Situation/Hospitalization: No - Comment as needed  Activities of Daily Living Home Assistive Devices/Equipment: Walker (specify type) ADL Screening (condition at time of admission) Patient's cognitive ability adequate to safely complete daily activities?: No Is the patient deaf or have difficulty hearing?: No Does the patient have difficulty seeing, even when wearing glasses/contacts?: No Does the patient have difficulty concentrating, remembering, or making decisions?: Yes Patient able to express need for assistance with ADLs?: No Does the patient have difficulty dressing or bathing?: Yes Independently performs ADLs?: Yes (appropriate for developmental age) Does the patient have difficulty walking or climbing stairs?: Yes Weakness of Legs: Both Weakness of Arms/Hands: None  Permission Sought/Granted Permission sought to share information with : Case Manager,Family Supports Permission granted to share information with : Yes, Verbal Permission Granted        Permission granted to share info w Relationship: Cousin  Permission granted to share info w Contact Information: [name] [phone]  Emotional Assessment Appearance:: Appears older than stated age Attitude/Demeanor/Rapport: Engaged,Gracious Affect (typically observed): Pleasant,Appropriate Orientation: : Oriented to Self,Oriented to Place,Oriented to  Time,Oriented to Situation Alcohol / Substance Use: Alcohol Use,Illicit Drugs Psych Involvement: No (  comment)  Admission diagnosis:  Alcohol withdrawal ([place]) [F10.239] Transient alteration of awareness [R40.4] Alcohol abuse [F10.10] Cocaine intoxication with delirium ([place]) [F14.921] Altered mental status [R41.82] Patient Active Problem List   Diagnosis Date Noted  . Altered mental status 03/29/2021  . Alcohol  withdrawal ([place]) 03/28/2021  . Alcohol abuse 06/14/2019  . CKD (chronic kidney disease), stage III ([place]) 06/06/2019  . Normochromic normocytic anemia 06/06/2019  . Pressure injury of skin 06/06/2019  . Substance abuse ([place])   . Seizure ([place]) 05/21/2019  . Acute encephalopathy 05/18/2019  . Acute lower UTI 05/18/2019  . Polysubstance abuse ([place]) 05/18/2019  . Wound dehiscence 07/10/2017  . Genetic testing 03/24/2017  . Hypertension 02/18/2017  . GERD (gastroesophageal reflux disease) 02/18/2017  . Rectal cancer ([place]) 01/27/2017  . Abdominal pain, epigastric   . Rectal mass   . Fever   . Anemia 01/16/2017  . Abdominal pain   . Constipation   . Alcohol abuse   . Tobacco abuse   . Cocaine abuse ([place])   . Bipolar affective disorder ([place])   . Protein-calorie malnutrition, severe ([place]) 02/13/2014  . Suicide ideation 02/12/2014  . Suicidal ideation 02/11/2014  . Alcohol intoxication ([place]) 02/11/2014  . Transaminitis 02/11/2014  . Substance abuse ([place]) 02/11/2014  . Depression 02/11/2014  . Thrombocytopenia ([place]) 02/11/2014   PCP:  Default, Provider, MD Pharmacy:   [facility] [place], [place] [address] Phone: [phone] Fax: [phone]     Social Determinants of Health (SDOH) Interventions    Readmission Risk Interventions No flowsheet data found.

## 2021-04-08 MED ORDER — ENSURE ENLIVE PO LIQD: 237.0000 mL | Freq: Three times a day (TID) | ORAL | 0 refills | Status: AC

## 2021-04-08 MED ORDER — ENSURE ENLIVE PO LIQD: 237.0000 mL | Freq: Three times a day (TID) | ORAL | 12 refills | Status: DC

## 2021-04-08 MED ORDER — GABAPENTIN 100 MG PO CAPS: 100.0000 mg | ORAL_CAPSULE | Freq: Three times a day (TID) | ORAL | 2 refills | Status: DC

## 2021-04-08 MED ORDER — ADULT MULTIVITAMIN W/MINERALS CH: 1.0000 | ORAL_TABLET | Freq: Every day | ORAL | Status: DC

## 2021-04-08 MED ORDER — CITALOPRAM HYDROBROMIDE 10 MG PO TABS: 10.0000 mg | ORAL_TABLET | Freq: Every day | ORAL | 2 refills | Status: DC
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
## 2021-04-08 NOTE — Discharge Summary (Addendum)
Physician Discharge Summary  Sherry Baker ATF:[id number] DOB: 06/15/1966 DOA: 03/28/2021  PCP: Default, Provider, MD  Admit date: 03/28/2021 Discharge date: 04/08/2021  Admitted From: Home  Discharge disposition: Home   Recommendations for Outpatient Follow-Up:   Follow up with your primary care provider in one week.  Check CBC, BMP, magnesium in the next visit    Discharge Diagnosis:   Principal Problem:   Alcohol withdrawal ([place]) Active Problems:   Cocaine abuse ([place])   Rectal cancer (HCC)   Hypertension   GERD (gastroesophageal reflux disease)   CKD (chronic kidney disease), stage III (H[facility])   Normochromic normocytic anemia   Altered mental status   Discharge Condition: Improved.  Diet recommendation:  Regular.  Wound care: None.  Code status: Full.   History of Present Illness:   55 year old female, past medical history of polysubstance abuse, alcohol abuse, cocaine abuse, bipolar disease, arthritis, rectal colon cancer status post colostomy, history of cirrhosis of liver was found unresponsive on the floor.  In the ED, alcohol level was less than 10 and drug screen was positive for cocaine.  Patient was admitted to hospital for possible alcohol withdrawal symptom and received more than 20 mg of Ativan so was put in the ICU for Precedex drip.  Was intubated on 03/30/2021 for airway protection.  She was in delirium tremens.  Subsequently, patient was stable for transfer out of the ICU.   Hospital Course:   Following conditions were addressed during hospitalization as listed below,  Acute metabolic toxic encephalopathy  secondary to polysubstance abuse and alcohol withdrawal.    Resolved.  Transition of care on board and will provide resources on discharge.     Severe alcohol withdrawal symptoms delirium tremens.  Was in the ICU for Precedex drip.  Seen by psychiatry as well.   Has been started on gabapentin , and Celexa.  This will be continued in addition to her  home medication.  Has remained stable without any withdrawal symptoms.   Bipolar Disorder, Type 1 Has been seen by psychiatry.  Continue buspirone, Celexa, gabapentin, hydroxyzine, clonidine on discharge.  Celexa and gabapentin were added to her regimen.   Acute hypoxic respiratory failure  secondary to aspiration pneumonia Patient was intubated for airway protection and extubated on 04/04/2021.  Patient has completed 5-day course of Ancef for methicillin sensitive staph aureus on tracheal aspirate.  Currently resolved.  No respiratory symptoms.   AKI on CKD stage IIIa Improved.   History of colon cancer status post colostomy Stable.  Continue colostomy care.   Severe protein calorie malnutrition Present on admission.  Was initially on tube feeds.  On oral diet at this time.  Has tolerated well.  She was encouraged oral nutrition.   Deconditioning, debility.-PT initially recommended skilled nursing facility but has improved clinically.  She was unable to get home physical therapy but has some assistance at home.   Disposition.  At this time, patient is stable for disposition home with outpatient PCP follow-up.  Medical Consultants:   PCCM Psychiatry  Procedures:    Intubation and mechanical ventilation  Subjective:   Today, patient was seen and examined at bedside.  Wishes to go home.  Denies any tremors, hallucinations, confusion  Discharge Exam:   Vitals:   04/08/21 0356 04/08/21 1241  BP: 110/66 (!) 100/53  Pulse: 79 87  Resp: 18 18  Temp: 98.8 F (37.1 C) 98.7 F (37.1 C)  SpO2: 97% 98%   Vitals:   04/07/21 1354 04/07/21 1950 04/08/21  0356 04/08/21 1241  BP: 105/70 110/62 110/66 (!) 100/53  Pulse: 78 81 79 87  Resp: 16 18 18 18   Temp: 98.5 F (36.9 C) 98.1 F (36.7 C) 98.8 F (37.1 C) 98.7 F (37.1 C)  TempSrc: Oral Oral Oral Oral  SpO2: 98% 98% 97% 98%  Weight:   49.4 kg   Height:        General: Alert awake, not in obvious distress, appears older  than the stated age, thinly built HENT: pupils equally reacting to light,  No scleral pallor or icterus noted. Oral mucosa is moist.  Chest:  Clear breath sounds.  Diminished breath sounds bilaterally. No crackles or wheezes.  CVS: S1 &S2 heard. No murmur.  Regular rate and rhythm. Abdomen: Soft, nontender, nondistended.  Bowel sounds are heard.  Stoma bag in place. Extremities: No cyanosis, clubbing or edema.  Peripheral pulses are palpable. Psych: Alert, awake and oriented, normal mood CNS:  No cranial nerve deficits.  Power equal in all extremities.  Muscle wasting noted. Skin: Warm and dry.  No rashes noted.  The results of significant diagnostics from this hospitalization (including imaging, microbiology, ancillary and laboratory) are listed below for reference.     Diagnostic Studies:   CT Head Wo Contrast  Result Date: 03/28/2021 CLINICAL DATA:  55 year old female with altered mental status. EXAM: CT HEAD WITHOUT CONTRAST TECHNIQUE: Contiguous axial images were obtained from the base of the skull through the vertex without intravenous contrast. COMPARISON:  Head CT dated 04/07/2020. FINDINGS: Brain: The ventricles and sulci are appropriate size for patient's age. The gray-white matter discrimination is preserved. There is no acute intracranial hemorrhage. No mass effect or midline shift. No extra-axial fluid collection. Vascular: No hyperdense vessel or unexpected calcification. Skull: Normal. Negative for fracture or focal lesion. Sinuses/Orbits: There is diffuse mucoperiosteal thickening of paranasal sinuses with near complete opacification of the right maxillary sinus. The mastoid air cells are clear. Cerumen noted in the right external auditory canal. Other: None IMPRESSION: 1. No acute intracranial pathology. 2. Paranasal sinus disease. Electronically Signed   By: Anner Crete M.D.   On: 03/28/2021 15:37   DG Chest Portable 1 View  Result Date: 03/28/2021 CLINICAL DATA:  [place]  altered EXAM: PORTABLE CHEST 1 VIEW COMPARISON:  05/26/2020 FINDINGS: Slight asymmetric opacity in right thorax probably related to positioning and rotation. No focal airspace disease or pleural effusion. Mild bronchitic changes. Normal cardiomediastinal silhouette. No pneumothorax. IMPRESSION: No active disease allowing for rotation and patient positioning. Electronically Signed   By: Donavan Foil M.D.   On: 03/28/2021 16:27     Labs:   Basic Metabolic Panel: Recent Labs  Lab 04/02/21 0201 04/03/21 0245 04/05/21 0239  NA 143 142 137  K 3.6 3.7 4.4  CL 112* 107 106  CO2 22 21* 21*  GLUCOSE 97 123* 101*  BUN 21* 22* 30*  CREATININE 1.42* 1.19* 1.06*  CALCIUM 8.8* 8.9 9.2  MG 2.6* 1.8 1.7  PHOS 3.8 4.1 4.3   GFR Estimated Creatinine Clearance: 47.9 mL/min (A) (by C-G formula based on SCr of 1.06 mg/dL (H)). Liver Function Tests: No results for input(s): AST, ALT, ALKPHOS, BILITOT, PROT, ALBUMIN in the last 168 hours. No results for input(s): LIPASE, AMYLASE in the last 168 hours. No results for input(s): AMMONIA in the last 168 hours. Coagulation profile No results for input(s): INR, PROTIME in the last 168 hours.  CBC: Recent Labs  Lab 04/02/21 0201 04/03/21 0245 04/05/21 0239  WBC 7.5 5.4 6.1  HGB 9.3* 9.0* 9.0*  HCT 29.5* 27.7* 27.6*  MCV 95.8 93.9 92.9  PLT 194 193 230   Cardiac Enzymes: No results for input(s): CKTOTAL, CKMB, CKMBINDEX, TROPONINI in the last 168 hours. BNP: Invalid input(s): POCBNP CBG: Recent Labs  Lab 04/05/21 1201 04/05/21 1204 04/06/21 0756 04/06/21 1227 04/06/21 1607  GLUCAP 54* 117* 74 87 85   D-Dimer No results for input(s): DDIMER in the last 72 hours. Hgb A1c No results for input(s): HGBA1C in the last 72 hours. Lipid Profile No results for input(s): CHOL, HDL, LDLCALC, TRIG, CHOLHDL, LDLDIRECT in the last 72 hours. Thyroid function studies No results for input(s): TSH, T4TOTAL, T3FREE, THYROIDAB in the last 72  hours.  Invalid input(s): FREET3 Anemia work up No results for input(s): VITAMINB12, FOLATE, FERRITIN, TIBC, IRON, RETICCTPCT in the last 72 hours. Microbiology Recent Results (from the past 240 hour(s))  MRSA PCR Screening     Status: None   Collection Time: 03/29/21 10:26 PM   Specimen: Nasal Mucosa; Nasopharyngeal  Result Value Ref Range Status   MRSA by PCR NEGATIVE NEGATIVE Final    Comment:        The GeneXpert MRSA Assay (FDA approved for NASAL specimens only), is one component of a comprehensive MRSA colonization surveillance program. It is not intended to diagnose MRSA infection nor to guide or monitor treatment for MRSA infections. Performed at [facility], [place] [address] [place], [place] [address]   Culture, Respiratory w Gram Stain     Status: None   Collection Time: 03/31/21  8:19 AM   Specimen: Tracheal Aspirate; Respiratory  Result Value Ref Range Status   Specimen Description TRACHEAL ASPIRATE  Final   Special Requests NONE  Final   Gram Stain   Final    RARE WBC PRESENT, PREDOMINANTLY PMN FEW GRAM POSITIVE COCCI RARE GRAM NEGATIVE RODS Performed at [facility], [place] [address], [place], [place] [address]    Culture   Final    ABUNDANT STAPHYLOCOCCUS AUREUS FEW STREPTOCOCCUS PNEUMONIAE    Report Status 04/03/2021 FINAL  Final   Organism ID, Bacteria STAPHYLOCOCCUS AUREUS  Final   Organism ID, Bacteria STREPTOCOCCUS PNEUMONIAE  Final      Susceptibility   Staphylococcus aureus - MIC*    CIPROFLOXACIN <=0.5 SENSITIVE Sensitive     ERYTHROMYCIN <=0.25 SENSITIVE Sensitive     GENTAMICIN <=0.5 SENSITIVE Sensitive     OXACILLIN 0.5 SENSITIVE Sensitive     TETRACYCLINE <=1 SENSITIVE Sensitive     VANCOMYCIN <=0.5 SENSITIVE Sensitive     TRIMETH/SULFA <=10 SENSITIVE Sensitive     CLINDAMYCIN <=0.25 SENSITIVE Sensitive     RIFAMPIN <=0.5 SENSITIVE Sensitive     Inducible Clindamycin NEGATIVE Sensitive     * ABUNDANT STAPHYLOCOCCUS AUREUS    Streptococcus pneumoniae - MIC*    LEVOFLOXACIN 0.5 SENSITIVE Sensitive     VANCOMYCIN 0.5 SENSITIVE Sensitive     PENICILLIN (meningitis) 1 RESISTANT Resistant     PENO - penicillin 1      PENICILLIN (non-meningitis) 1 SENSITIVE Sensitive     PENICILLIN (oral) 1 INTERMEDIATE Intermediate     CEFTRIAXONE (non-meningitis) 1 SENSITIVE Sensitive     CEFTRIAXONE (meningitis) 1 INTERMEDIATE Intermediate     * FEW STREPTOCOCCUS PNEUMONIAE     Discharge Instructions:   Discharge Instructions     Diet general   Complete by: As directed    Discharge instructions   Complete by: As directed    Follow-up with your primary care  physician in 1 week.  Please do not drink alcohol.  Seek medical attention for worsening symptoms.   Discharge wound care:   Complete by: As directed    Place a narrow piece of Aquacel  over the pink wound in the coccyx area, cover with a foam dressing. Change the aquacel daily. The foam dressing can remain in place up to 3 days if unsoiled.   Increase activity slowly   Complete by: As directed       Allergies as of 04/08/2021       Reactions   Penicillins Hives, Swelling   Did it involve swelling of the face/tongue/throat, SOB, or low BP? Yes Did it involve sudden or severe rash/hives, skin peeling, or any reaction on the inside of your mouth or nose? No Did you need to seek medical attention at a hospital or doctor's office? No When did it last happen?  <10 years   If all above answers are "NO", may proceed with cephalosporin use.   Penicillins Hives, Swelling   Has patient had a PCN reaction causing immediate rash, facial/tongue/throat swelling, SOB or lightheadedness with hypotension: Yes Has patient had a PCN reaction causing severe rash involving mucus membranes or skin necrosis: No Has patient had a PCN reaction that required hospitalization No Has patient had a PCN reaction occurring within the last 10 years: Yes If all of the above answers are "NO", then  may proceed with Cephalosporin use.        Medication List     TAKE these medications    amLODipine 10 MG tablet Commonly known as: NORVASC Take 1 tablet (10 mg total) by mouth daily.   busPIRone 15 MG tablet Commonly known as: BUSPAR Take 1 tablet (15 mg total) by mouth 2 (two) times daily.   citalopram 10 MG tablet Commonly known as: CELEXA Take 1 tablet (10 mg total) by mouth daily. Start taking on: April 09, 2021   cloNIDine 0.1 MG tablet Commonly known as: CATAPRES Take 1 tablet (0.1 mg total) by mouth 3 (three) times daily.   feeding supplement Liqd Take 237 mLs by mouth 3 (three) times daily between meals.   gabapentin 100 MG capsule Commonly known as: NEURONTIN Take 1 capsule (100 mg total) by mouth 3 (three) times daily.   hydrOXYzine 10 MG tablet Commonly known as: ATARAX/VISTARIL Take 1 tablet (10 mg total) by mouth 3 (three) times daily as needed. As needed for acute anxiety   multivitamin with minerals Tabs tablet Place 1 tablet into feeding tube daily. Start taking on: April 09, 2021   omeprazole 40 MG capsule Commonly known as: PRILOSEC Take 1 capsule (40 mg total) by mouth daily.               Discharge Care Instructions  (From admission, onward)           Start     Ordered   04/08/21 0000  Discharge wound care:       Comments: Place a narrow piece of Aquacel  over the pink wound in the coccyx area, cover with a foam dressing. Change the aquacel daily. The foam dressing can remain in place up to 3 days if unsoiled.   04/08/21 1507            Follow-up Information     [facility]   Contact information: [address] [place] [address] [phone]  Time coordinating discharge: 39 minutes  Signed:  Krimson Massmann  [facility] 04/08/2021, 3:07 PM

## 2021-05-20 ENCOUNTER — Ambulatory Visit: Payer: Medicaid Other | Admitting: Nurse Practitioner

## 2021-06-10 ENCOUNTER — Ambulatory Visit: Payer: Medicaid Other | Admitting: Nurse Practitioner

## 2021-06-19 ENCOUNTER — Telehealth: Payer: Self-pay | Admitting: *Deleted

## 2021-06-19 NOTE — Telephone Encounter (Signed)
Received refill request from [facility] for her Adult medium pull-ups. Patient has not been in office since 01/18/20 and was due return 6 months later (07/23/20) and was "no show". Attempted to reach her at both numbers in Epic without success. Called her friend, Becky Sax and she said patient no longer has a telephone, but she does know where she lives and can give her a message. She does not know the home address. Informed her to have her call office to discuss the requested refill and a return appointment.

## 2021-06-22 ENCOUNTER — Inpatient Hospital Stay (HOSPITAL_COMMUNITY)
Admission: EM | Admit: 2021-06-22 | Discharge: 2021-06-30 | DRG: 896 | Disposition: A | Discharge status: Home Health | Payer: Medicaid Other | Attending: Family Medicine | Admitting: Family Medicine

## 2021-06-22 ENCOUNTER — Observation Stay (HOSPITAL_COMMUNITY): Payer: Medicaid Other

## 2021-06-22 ENCOUNTER — Emergency Department (HOSPITAL_COMMUNITY): Payer: Medicaid Other

## 2021-06-22 ENCOUNTER — Other Ambulatory Visit: Payer: Self-pay

## 2021-06-22 ENCOUNTER — Encounter (HOSPITAL_COMMUNITY): Payer: Self-pay

## 2021-06-22 DIAGNOSIS — D649 Anemia, unspecified: Secondary | ICD-10-CM | POA: Diagnosis present

## 2021-06-22 DIAGNOSIS — F17210 Nicotine dependence, cigarettes, uncomplicated: Secondary | ICD-10-CM | POA: Diagnosis not present

## 2021-06-22 DIAGNOSIS — Z880 Allergy status to penicillin: Secondary | ICD-10-CM

## 2021-06-22 DIAGNOSIS — Z681 Body mass index (BMI) 19 or less, adult: Secondary | ICD-10-CM

## 2021-06-22 DIAGNOSIS — N179 Acute kidney failure, unspecified: Secondary | ICD-10-CM | POA: Diagnosis not present

## 2021-06-22 DIAGNOSIS — F1910 Other psychoactive substance abuse, uncomplicated: Secondary | ICD-10-CM | POA: Diagnosis present

## 2021-06-22 DIAGNOSIS — F1410 Cocaine abuse, uncomplicated: Secondary | ICD-10-CM | POA: Diagnosis present

## 2021-06-22 DIAGNOSIS — Z28310 Unvaccinated for covid-19: Secondary | ICD-10-CM

## 2021-06-22 DIAGNOSIS — R54 Age-related physical debility: Secondary | ICD-10-CM | POA: Diagnosis present

## 2021-06-22 DIAGNOSIS — Z720 Tobacco use: Secondary | ICD-10-CM

## 2021-06-22 DIAGNOSIS — I10 Essential (primary) hypertension: Secondary | ICD-10-CM

## 2021-06-22 DIAGNOSIS — N1831 Chronic kidney disease, stage 3a: Secondary | ICD-10-CM | POA: Diagnosis not present

## 2021-06-22 DIAGNOSIS — F14951 Cocaine use, unspecified with cocaine-induced psychotic disorder with hallucinations: Secondary | ICD-10-CM

## 2021-06-22 DIAGNOSIS — F14159 Cocaine abuse with cocaine-induced psychotic disorder, unspecified: Secondary | ICD-10-CM | POA: Diagnosis present

## 2021-06-22 DIAGNOSIS — Z5900 Homelessness unspecified: Secondary | ICD-10-CM

## 2021-06-22 DIAGNOSIS — N182 Chronic kidney disease, stage 2 (mild): Secondary | ICD-10-CM | POA: Diagnosis present

## 2021-06-22 DIAGNOSIS — Z79899 Other long term (current) drug therapy: Secondary | ICD-10-CM

## 2021-06-22 DIAGNOSIS — R4182 Altered mental status, unspecified: Principal | ICD-10-CM

## 2021-06-22 DIAGNOSIS — L89150 Pressure ulcer of sacral region, unstageable: Secondary | ICD-10-CM | POA: Diagnosis present

## 2021-06-22 DIAGNOSIS — F10931 Alcohol use, unspecified with withdrawal delirium: Secondary | ICD-10-CM

## 2021-06-22 DIAGNOSIS — K7030 Alcoholic cirrhosis of liver without ascites: Secondary | ICD-10-CM | POA: Diagnosis present

## 2021-06-22 DIAGNOSIS — F10231 Alcohol dependence with withdrawal delirium: Principal | ICD-10-CM | POA: Diagnosis present

## 2021-06-22 DIAGNOSIS — M1990 Unspecified osteoarthritis, unspecified site: Secondary | ICD-10-CM | POA: Diagnosis present

## 2021-06-22 DIAGNOSIS — U071 COVID-19: Secondary | ICD-10-CM

## 2021-06-22 DIAGNOSIS — G929 Unspecified toxic encephalopathy: Secondary | ICD-10-CM | POA: Diagnosis present

## 2021-06-22 DIAGNOSIS — Z85048 Personal history of other malignant neoplasm of rectum, rectosigmoid junction, and anus: Secondary | ICD-10-CM

## 2021-06-22 DIAGNOSIS — K219 Gastro-esophageal reflux disease without esophagitis: Secondary | ICD-10-CM

## 2021-06-22 DIAGNOSIS — E43 Unspecified severe protein-calorie malnutrition: Secondary | ICD-10-CM | POA: Diagnosis present

## 2021-06-22 DIAGNOSIS — R64 Cachexia: Secondary | ICD-10-CM | POA: Diagnosis present

## 2021-06-22 DIAGNOSIS — R636 Underweight: Secondary | ICD-10-CM

## 2021-06-22 DIAGNOSIS — Z933 Colostomy status: Secondary | ICD-10-CM

## 2021-06-22 DIAGNOSIS — E860 Dehydration: Secondary | ICD-10-CM | POA: Diagnosis not present

## 2021-06-22 DIAGNOSIS — I129 Hypertensive chronic kidney disease with stage 1 through stage 4 chronic kidney disease, or unspecified chronic kidney disease: Secondary | ICD-10-CM | POA: Diagnosis present

## 2021-06-22 DIAGNOSIS — R627 Adult failure to thrive: Secondary | ICD-10-CM | POA: Diagnosis present

## 2021-06-22 DIAGNOSIS — D631 Anemia in chronic kidney disease: Secondary | ICD-10-CM | POA: Diagnosis present

## 2021-06-22 DIAGNOSIS — Z809 Family history of malignant neoplasm, unspecified: Secondary | ICD-10-CM

## 2021-06-22 HISTORY — DX: Alcohol use, unspecified with withdrawal delirium: F10.931

## 2021-06-22 LAB — I-STAT BETA HCG BLOOD, ED (MC, WL, AP ONLY): I-stat hCG, quantitative: 8.5 m[IU]/mL — ABNORMAL HIGH (ref <5)

## 2021-06-22 LAB — COMPREHENSIVE METABOLIC PANEL
ALT: 27 U/L (ref 0–44)
AST: 40 U/L (ref 15–41)
Albumin: 3.3 g/dL — ABNORMAL LOW (ref 3.5–5.0)
Alkaline Phosphatase: 79 U/L (ref 38–126)
Anion gap: 9 (ref 5–15)
BUN: 22 mg/dL — ABNORMAL HIGH (ref 6–20)
CO2: 21 mmol/L — ABNORMAL LOW (ref 22–32)
Calcium: 9.1 mg/dL (ref 8.9–10.3)
Chloride: 109 mmol/L (ref 98–111)
Creatinine, Ser: 1.03 mg/dL — ABNORMAL HIGH (ref 0.44–1.00)
GFR, Estimated: >60 mL/min (ref >60)
Glucose, Bld: 71 mg/dL (ref 70–99)
Potassium: 3.5 mmol/L (ref 3.5–5.1)
Sodium: 139 mmol/L (ref 135–145)
Total Bilirubin: 0.7 mg/dL (ref 0.3–1.2)
Total Protein: 7.8 g/dL (ref 6.5–8.1)

## 2021-06-22 LAB — URINALYSIS, ROUTINE W REFLEX MICROSCOPIC
Bilirubin Urine: NEGATIVE
Glucose, UA: NEGATIVE mg/dL
Ketones, ur: 5 mg/dL — AB
Nitrite: NEGATIVE
Protein, ur: 100 mg/dL — AB
Specific Gravity, Urine: 1.009 (ref 1.005–1.030)
pH: 6 (ref 5.0–8.0)

## 2021-06-22 LAB — SALICYLATE LEVEL: Salicylate Lvl: <7 mg/dL — ABNORMAL LOW (ref 7.0–30.0)

## 2021-06-22 LAB — CBC WITH DIFFERENTIAL/PLATELET
Abs Immature Granulocytes: 0.01 10*3/uL (ref 0.00–0.07)
Basophils Absolute: 0 10*3/uL (ref 0.0–0.1)
Basophils Relative: 0 %
Eosinophils Absolute: 0.1 10*3/uL (ref 0.0–0.5)
Eosinophils Relative: 2 %
HCT: 36.1 % (ref 36.0–46.0)
Hemoglobin: 11.7 g/dL — ABNORMAL LOW (ref 12.0–15.0)
Immature Granulocytes: 0 %
Lymphocytes Relative: 17 %
Lymphs Abs: 0.6 10*3/uL — ABNORMAL LOW (ref 0.7–4.0)
MCH: 28.7 pg (ref 26.0–34.0)
MCHC: 32.4 g/dL (ref 30.0–36.0)
MCV: 88.7 fL (ref 80.0–100.0)
Monocytes Absolute: 0.3 10*3/uL (ref 0.1–1.0)
Monocytes Relative: 10 %
Neutro Abs: 2.4 10*3/uL (ref 1.7–7.7)
Neutrophils Relative %: 71 %
Platelets: 166 10*3/uL (ref 150–400)
RBC: 4.07 MIL/uL (ref 3.87–5.11)
RDW: 14.7 % (ref 11.5–15.5)
WBC: 3.4 10*3/uL — ABNORMAL LOW (ref 4.0–10.5)
nRBC: 0 % (ref 0.0–0.2)

## 2021-06-22 LAB — RAPID URINE DRUG SCREEN, HOSP PERFORMED
Amphetamines: NOT DETECTED
Barbiturates: NOT DETECTED
Benzodiazepines: NOT DETECTED
Cocaine: POSITIVE — AB
Opiates: NOT DETECTED
Tetrahydrocannabinol: POSITIVE — AB

## 2021-06-22 LAB — RESP PANEL BY RT-PCR (FLU A&B, COVID) ARPGX2
Influenza A by PCR: NEGATIVE
Influenza B by PCR: NEGATIVE
SARS Coronavirus 2 by RT PCR: POSITIVE — AB

## 2021-06-22 LAB — ETHANOL: Alcohol, Ethyl (B): <10 mg/dL (ref <10)

## 2021-06-22 LAB — ACETAMINOPHEN LEVEL: Acetaminophen (Tylenol), Serum: <10 ug/mL — ABNORMAL LOW (ref 10–30)

## 2021-06-22 LAB — LIPASE, BLOOD: Lipase: 63 U/L — ABNORMAL HIGH (ref 11–51)

## 2021-06-22 MED ORDER — NICOTINE 21 MG/24HR TD PT24
21.0000 mg | MEDICATED_PATCH | Freq: Every day | TRANSDERMAL | Status: DC
  Administered 2021-06-22 – 2021-06-30 (×9): 21 mg via TRANSDERMAL
  Filled 2021-06-22 (×9): qty 1

## 2021-06-22 MED ORDER — FOLIC ACID 1 MG PO TABS
1.0000 mg | ORAL_TABLET | Freq: Every day | ORAL | Status: DC
Start: 2021-06-23 — End: 2021-06-30
  Administered 2021-06-23 – 2021-06-30 (×8): 1 mg via ORAL
  Filled 2021-06-22 (×8): qty 1

## 2021-06-22 MED ORDER — ADULT MULTIVITAMIN W/MINERALS CH
1.0000 | ORAL_TABLET | Freq: Every day | ORAL | Status: DC
  Administered 2021-06-23 – 2021-06-30 (×8): 1 via ORAL
  Filled 2021-06-22 (×8): qty 1

## 2021-06-22 MED ORDER — ACETAMINOPHEN 325 MG PO TABS: 650.0000 mg | ORAL_TABLET | ORAL | Status: DC | PRN

## 2021-06-22 MED ORDER — ZIPRASIDONE MESYLATE 20 MG IM SOLR
20.0000 mg | INTRAMUSCULAR | Status: DC | PRN
  Filled 2021-06-22: qty 20

## 2021-06-22 MED ORDER — ONDANSETRON HCL 4 MG PO TABS
4.0000 mg | ORAL_TABLET | Freq: Three times a day (TID) | ORAL | Status: DC | PRN
  Administered 2021-06-23: 4 mg via ORAL
  Filled 2021-06-22: qty 1

## 2021-06-22 MED ORDER — RISPERIDONE 1 MG PO TBDP
2.0000 mg | ORAL_TABLET | Freq: Three times a day (TID) | ORAL | Status: DC | PRN
  Filled 2021-06-22: qty 2

## 2021-06-22 MED ORDER — DEXMEDETOMIDINE HCL IN NACL 200 MCG/50ML IV SOLN
0.2000 ug/kg/h | INTRAVENOUS | Status: DC
Start: 2021-06-22 — End: 2021-06-23

## 2021-06-22 MED ORDER — ZOLPIDEM TARTRATE 5 MG PO TABS
5.0000 mg | ORAL_TABLET | Freq: Every evening | ORAL | Status: DC | PRN
  Filled 2021-06-22: qty 1

## 2021-06-22 MED ORDER — LORAZEPAM 1 MG PO TABS: 1.0000 mg | ORAL_TABLET | ORAL | Status: DC | PRN

## 2021-06-22 MED ORDER — ALUM & MAG HYDROXIDE-SIMETH 200-200-20 MG/5ML PO SUSP: 30.0000 mL | Freq: Four times a day (QID) | ORAL | Status: DC | PRN

## 2021-06-22 MED ORDER — MIDAZOLAM HCL 2 MG/2ML IJ SOLN: 1.0000 mg | INTRAMUSCULAR | Status: DC | PRN

## 2021-06-22 MED ORDER — THIAMINE HCL 100 MG PO TABS: 100.0000 mg | ORAL_TABLET | Freq: Every day | ORAL | Status: DC

## 2021-06-22 NOTE — ED Provider Notes (Signed)
Sherry Baker   CSN: [id number] Arrival date & time: 06/22/21  3094     History Chief Complaint  Patient presents with   Leg Pain    Sherry Baker is a 55 y.o. female. She has a PMH of polysubstance abuse, alcohol abuse, cirrhosis of the liver, bipolar II, depression, tobacco abuse, rectal cancer s/p colostomy, CKD stage III, HTN, and GERD.  She presents to the ED today after drinking two beers this morning, Her friend called EMS because she was acting out of her mind. She also is complaining of bilateral leg swelling and left leg pain. She denies chest pain, shortness of breath, cough, abdominal pain, nausea, vomiting.    Past Medical History:  Diagnosis Date   Alcohol abuse    Allergy    PCNS swelling   Arthritis    Bipolar 1 disorder ([place])    Cancer ([place]) 01/21/2017   rectal cancer   Cancer ([place])    Cirrhosis of liver ([place])    Depression    Genetic testing 03/24/2017   Ms. Devonshire underwent genetic counseling and testing for hereditary cancer syndromes on 02/17/2017. Her results were negative for mutations in all 46 genes analyzed by [facility]'s 46-gene Common Hereditary Cancers Panel. Genes analyzed include: APC, ATM, AXIN2, BARD1, BMPR1A, BRCA1, BRCA2, BRIP1, CDH1, CDKN2A, CHEK2, CTNNA1, DICER1, EPCAM, GREM1, HOXB13, KIT, MEN1, MLH1, MSH2, MSH3, MSH6, MUTYH, NBN,   Hypertension     Patient Active Problem List   Diagnosis Date Noted   Altered mental status 03/29/2021   Alcohol withdrawal ([place]) 03/28/2021   Alcohol abuse 06/14/2019   CKD (chronic kidney disease), stage III ([place]) 06/06/2019   Normochromic normocytic anemia 06/06/2019   Pressure injury of skin 06/06/2019   Substance abuse ([place])    Seizure ([place]) 05/21/2019   Acute encephalopathy 05/18/2019   Acute lower UTI 05/18/2019   Polysubstance abuse ([place]) 05/18/2019   Wound dehiscence 07/10/2017   Genetic testing 03/24/2017   Hypertension 02/18/2017   GERD  (gastroesophageal reflux disease) 02/18/2017   Rectal cancer ([place]) 01/27/2017   Abdominal pain, epigastric    Rectal mass    Fever    Anemia 01/16/2017   Abdominal pain    Constipation    Alcohol abuse    Tobacco abuse    Cocaine abuse ([place])    Bipolar affective disorder ([place])    Protein-calorie malnutrition, severe ([place]) 02/13/2014   Suicide ideation 02/12/2014   Suicidal ideation 02/11/2014   Alcohol intoxication ([place]) 02/11/2014   Transaminitis 02/11/2014   Substance abuse ([place]) 02/11/2014   Depression 02/11/2014   Thrombocytopenia ([place]) 02/11/2014    Past Surgical History:  Procedure Laterality Date   ABDOMINAL PERINEAL BOWEL RESECTION N/A 06/18/2017   Procedure: ABDOMINAL PERINEAL RESECTION ERAS PATHWAY;  Surgeon: Leighton Ruff, MD;  Location: [facility];  Service: General;  Laterality: N/A;   COLON SURGERY     COLONOSCOPY WITH PROPOFOL Left 01/21/2017   Procedure: COLONOSCOPY WITH PROPOFOL;  Surgeon: Otis Brace, MD;  Location: [facility];  Service: Gastroenterology;  Laterality: Left;   FRACTURE SURGERY     MANDIBLE FRACTURE SURGERY       OB History   No obstetric history on file.     Family History  Problem Relation Age of Onset   Cancer Mother 81       Originating in the abdomen, otherwise unknwon   Cancer - Other Maternal Aunt        Throat    Social History  Tobacco Use   Smoking status: Every Day    Packs/day: 0.50    Years: 39.00    Pack years: 19.50    Types: Cigarettes   Smokeless tobacco: Never  Vaping Use   Vaping Use: Never used  Substance Use Topics   Alcohol use: Yes    Comment: 07/28/2017 "40oz beer/day; if I have it"   Drug use: Yes    Types: "Crack" cocaine, Cocaine    Comment: 07/28/2017 "nothing in 3 wks"    Home Medications Prior to Admission medications   Medication Sig Start Date End Date Taking? Authorizing Provider  amLODipine (NORVASC) 10 MG tablet Take 1 tablet (10 mg total) by mouth daily. 11/15/20   Argentina Donovan, PA-C  busPIRone (BUSPAR) 15 MG tablet Take 1 tablet (15 mg total) by mouth 2 (two) times daily. 11/15/20   Argentina Donovan, PA-C  citalopram (CELEXA) 10 MG tablet Take 1 tablet (10 mg total) by mouth daily. 04/09/21   Pokhrel, Corrie Mckusick, MD  cloNIDine (CATAPRES) 0.1 MG tablet Take 1 tablet (0.1 mg total) by mouth 3 (three) times daily. 11/15/20   Argentina Donovan, PA-C  gabapentin (NEURONTIN) 100 MG capsule Take 1 capsule (100 mg total) by mouth 3 (three) times daily. 04/08/21   Pokhrel, Corrie Mckusick, MD  hydrOXYzine (ATARAX/VISTARIL) 10 MG tablet Take 1 tablet (10 mg total) by mouth 3 (three) times daily as needed. As needed for acute anxiety 11/15/20   Argentina Donovan, PA-C  Multiple Vitamin (MULTIVITAMIN WITH MINERALS) TABS tablet Place 1 tablet into feeding tube daily. 04/09/21   Pokhrel, Corrie Mckusick, MD  omeprazole (PRILOSEC) 40 MG capsule Take 1 capsule (40 mg total) by mouth daily. 11/15/20   Argentina Donovan, PA-C  ziprasidone (GEODON) 40 MG capsule Take 1 capsule (40 mg total) by mouth 2 (two) times daily with a meal. 11/06/11 01/09/12  Daleen Bo, MD    Allergies    Penicillins and Penicillins  Review of Systems   Review of Systems  Eyes:  Negative for visual disturbance.  Respiratory:  Negative for cough, chest tightness and shortness of breath.   Cardiovascular:  Positive for leg swelling. Negative for chest pain.  Gastrointestinal:  Negative for abdominal pain, diarrhea, nausea and vomiting.  Neurological:  Negative for dizziness, weakness and headaches.  Psychiatric/Behavioral:  Positive for confusion. Negative for agitation and hallucinations.   All other systems reviewed and are negative.  Physical Exam Updated Vital Signs BP (!) 182/71   Pulse 83   Temp 98 F (36.7 C) (Oral)   Resp 18   SpO2 96%   Physical Exam Vitals and nursing Baker reviewed.  Constitutional:      General: She is not in acute distress.    Appearance: She is ill-appearing.  HENT:     Head: Normocephalic and  atraumatic.  Eyes:     General: No scleral icterus.    Conjunctiva/sclera: Conjunctivae normal.     Comments: Pupils dilated, equal, round, and reactive.   Cardiovascular:     Rate and Rhythm: Normal rate and regular rhythm.     Pulses: Normal pulses.          Dorsalis pedis pulses are 2+ on the right side and 2+ on the left side.     Heart sounds: Normal heart sounds. No murmur heard.   No friction rub. No gallop.  Pulmonary:     Effort: Pulmonary effort is normal. No respiratory distress.     Breath sounds: No wheezing, rhonchi or  rales.  Abdominal:     General: Abdomen is flat. The ostomy site is clean.     Palpations: Abdomen is soft.     Tenderness: There is generalized abdominal tenderness.     Comments: Generalized tenderness to palpation Colostomy bag intact. No blood noted in stool.   Musculoskeletal:        General: Tenderness present.     Right lower leg: No tenderness. 2+ Pitting Edema present.     Left lower leg: Tenderness present. 2+ Pitting Edema present.  Skin:    General: Skin is warm and dry.     Coloration: Skin is not jaundiced.     Findings: Erythema present. No bruising.     Comments: Erythema to right lower leg due to scratching per patient.   Neurological:     General: No focal deficit present.     Mental Status: She is alert.     GCS: GCS eye subscore is 4. GCS verbal subscore is 5. GCS motor subscore is 6.     Cranial Nerves: Cranial nerves are intact.     Sensory: Sensation is intact.     Motor: Motor function is intact.  Psychiatric:        Attention and Perception: She is inattentive.        Mood and Affect: Affect is inappropriate.        Speech: Speech normal.    ED Results / Procedures / Treatments   Labs (all labs ordered are listed, but only abnormal results are displayed) Labs Reviewed  COMPREHENSIVE METABOLIC PANEL - Abnormal; Notable for the following components:      Result Value   CO2 21 (*)    BUN 22 (*)    Creatinine, Ser  1.03 (*)    Albumin 3.3 (*)    All other components within normal limits  CBC WITH DIFFERENTIAL/PLATELET - Abnormal; Notable for the following components:   WBC 3.4 (*)    Hemoglobin 11.7 (*)    Lymphs Abs 0.6 (*)    All other components within normal limits  LIPASE, BLOOD - Abnormal; Notable for the following components:   Lipase 63 (*)    All other components within normal limits  I-STAT BETA HCG BLOOD, ED ([facility], [facility], [facility] ONLY) - Abnormal; Notable for the following components:   I-stat hCG, quantitative 8.5 (*)    All other components within normal limits  RESP PANEL BY RT-PCR (FLU A&B, COVID) ARPGX2  ETHANOL  RAPID URINE DRUG SCREEN, HOSP PERFORMED  SALICYLATE LEVEL  ACETAMINOPHEN LEVEL    EKG None  Radiology DG Chest 2 View  Result Date: 06/22/2021 CLINICAL DATA:  Leg pain.  Generalized weakness.  ETOH. EXAM: CHEST - 2 VIEW COMPARISON:  04/03/2021 FINDINGS: The heart size and mediastinal contours are within normal limits. Both lungs are clear. The visualized skeletal structures are unremarkable. IMPRESSION: No active cardiopulmonary disease. Electronically Signed   By: Nolon Nations M.D.   On: 06/22/2021 13:46    Procedures Procedures   Medications Ordered in ED Medications - No data to display  ED Course  I have reviewed the triage vital signs and the nursing notes.  Pertinent labs & imaging results that were available during my care of the patient were reviewed by me and considered in my medical decision making (see chart for details).  Clinical Course as of 06/22/21 1538  Sat Jun 22, 2021  1431 Chest XR normal  [GL]  1515 CBC with some leukopenia. Overall unremarkable [GL]  1519 Reevaluated patient and still altered. Awaiting labs. [GL]    Clinical Course User Index [GL] Parissa Chiao, Adora Fridge, PA-C   MDM Rules/Calculators/A&P                          Patient presents to ED after drinking two beers and feeling "out of her mind". She appears intoxicated and she is  a poor historian. She is hypertensive with no evidence of end organ damage, but vital signs otherwise normal. Her legs are swollen bilaterally with 2+ pitting edema so I am not suspicious for a DVT. It is consistent with third spacing edema due to chronic cirrhosis diagnosis. Her abdomen is diffusely tender but soft. It is unclear if this is due to chronic liver inflammation or another inflammation. Plan to obtain basic labs, liver enzymes, lipase, ETOH, ASA, tylenol, and UDS.   Thus far, workup has been largely unremarkable. UDS, ETOH, ASA, and Acetaminophen still pending. I suspect that she has substances on board leading to her presentation given that all other labs have came back normal.   Sign out with Gertie Fey, PA-C who will be taking over for the remainder of her care.   Final Clinical Impression(s) / ED Diagnoses Final diagnoses:  None    Rx / DC Orders ED Discharge Orders     None        Adolphus Birchwood, PA-C 06/22/21 1539    Regan Lemming, MD 06/23/21 1231

## 2021-06-22 NOTE — ED Provider Notes (Signed)
Received signout from previous provider, please see her notes for complete H&P.  This is a 55 year old female significant history of polysubstance abuse and alcohol abuse who was brought here via EMS from home due to altered mental status.  Patient admits that she uses crack and drink alcohol on a regular basis.  At this moment she feels like she's  "out of this world".  She admits to both auditory and visual hallucination.  She denies SI HI.  She report last crack use was several days ago and last alcohol use was today.  She was noted to have swelling to her legs bilaterally, this is not new according to patient.  I did discuss drug use cessation and patient amenable for outpatient resources to seek help for her substance use.  Labs demonstrate mildly elevated lipase of 63.  No significant abdominal tenderness on exam.  Chest x-ray unremarkable, labs are mostly reassuring.  An i-STAT hCG was obtained and it was 8.5 however patient is postmenopausal so this is likely to be a false positive.  Alcohol level is negative.  UDS is pending.  9:06 PM UDS positive for cocaine and tetrahydrocannabinol.  COVID test positive.  Patient's presentation is suggestive of drug-induced psychosis.  No SI or HI.  She does not have any true COVID symptoms. Pt however appears increasingly altered.  CIWA 13, concerns for alcohol withdrawal.  Will treat.  Will continue to monitor.  10:48 PM Pt was admitted 3 months ago for Delirium Tremens from alcohol withdrawal.  Her presentation is similar during this visit, however she is not obtunded.  Due to concerns of DT in the setting of drug induce psychosis and positive covid test, will consult medicine for admission.    11:06 PM Appreciate consultation from [facility] Dr. Roel Cluck who agrees to see and will admit pt for further management of alcohol withdrawal and covid.    BP (!) 194/114   Pulse 83   Temp 98 F (36.7 C) (Oral)   Resp 20   SpO2 94%   Results for  orders placed or performed during the hospital encounter of 06/22/21  Resp Panel by RT-PCR (Flu A&B, Covid) Nasopharyngeal Swab   Specimen: Nasopharyngeal Swab; Nasopharyngeal(NP) swabs in vial transport medium  Result Value Ref Range   SARS Coronavirus 2 by RT PCR POSITIVE (A) NEGATIVE   Influenza A by PCR NEGATIVE NEGATIVE   Influenza B by PCR NEGATIVE NEGATIVE  Comprehensive metabolic panel  Result Value Ref Range   Sodium 139 135 - 145 mmol/L   Potassium 3.5 3.5 - 5.1 mmol/L   Chloride 109 98 - 111 mmol/L   CO2 21 (L) 22 - 32 mmol/L   Glucose, Bld 71 70 - 99 mg/dL   BUN 22 (H) 6 - 20 mg/dL   Creatinine, Ser 1.03 (H) 0.44 - 1.00 mg/dL   Calcium 9.1 8.9 - 10.3 mg/dL   Total Protein 7.8 6.5 - 8.1 g/dL   Albumin 3.3 (L) 3.5 - 5.0 g/dL   AST 40 15 - 41 U/L   ALT 27 0 - 44 U/L   Alkaline Phosphatase 79 38 - 126 U/L   Total Bilirubin 0.7 0.3 - 1.2 mg/dL   GFR, Estimated >60 >60 mL/min   Anion gap 9 5 - 15  Ethanol  Result Value Ref Range   Alcohol, Ethyl (B) <10 <10 mg/dL  Urine rapid drug screen (hosp performed)  Result Value Ref Range   Opiates NONE DETECTED NONE DETECTED   Cocaine POSITIVE (A)  NONE DETECTED   Benzodiazepines NONE DETECTED NONE DETECTED   Amphetamines NONE DETECTED NONE DETECTED   Tetrahydrocannabinol POSITIVE (A) NONE DETECTED   Barbiturates NONE DETECTED NONE DETECTED  CBC with Diff  Result Value Ref Range   WBC 3.4 (L) 4.0 - 10.5 K/uL   RBC 4.07 3.87 - 5.11 MIL/uL   Hemoglobin 11.7 (L) 12.0 - 15.0 g/dL   HCT 36.1 36.0 - 46.0 %   MCV 88.7 80.0 - 100.0 fL   MCH 28.7 26.0 - 34.0 pg   MCHC 32.4 30.0 - 36.0 g/dL   RDW 14.7 11.5 - 15.5 %   Platelets 166 150 - 400 K/uL   nRBC 0.0 0.0 - 0.2 %   Neutrophils Relative % 71 %   Neutro Abs 2.4 1.7 - 7.7 K/uL   Lymphocytes Relative 17 %   Lymphs Abs 0.6 (L) 0.7 - 4.0 K/uL   Monocytes Relative 10 %   Monocytes Absolute 0.3 0.1 - 1.0 K/uL   Eosinophils Relative 2 %   Eosinophils Absolute 0.1 0.0 - 0.5 K/uL    Basophils Relative 0 %   Basophils Absolute 0.0 0.0 - 0.1 K/uL   Immature Granulocytes 0 %   Abs Immature Granulocytes 0.01 123XX123 - 99991111 K/uL  Salicylate level  Result Value Ref Range   Salicylate Lvl [id number] (L) 7.0 - 30.0 mg/dL  Acetaminophen level  Result Value Ref Range   Acetaminophen (Tylenol), Serum <10 (L) 10 - 30 ug/mL  Lipase, blood  Result Value Ref Range   Lipase 63 (H) 11 - 51 U/L  Urinalysis, Routine w reflex microscopic  Result Value Ref Range   Color, Urine STRAW (A) YELLOW   APPearance CLEAR CLEAR   Specific Gravity, Urine 1.009 1.005 - 1.030   pH 6.0 5.0 - 8.0   Glucose, UA NEGATIVE NEGATIVE mg/dL   Hgb urine dipstick SMALL (A) NEGATIVE   Bilirubin Urine NEGATIVE NEGATIVE   Ketones, ur 5 (A) NEGATIVE mg/dL   Protein, ur 100 (A) NEGATIVE mg/dL   Nitrite NEGATIVE NEGATIVE   Leukocytes,Ua TRACE (A) NEGATIVE   RBC / HPF 0-5 0 - 5 RBC/hpf   WBC, UA 0-5 0 - 5 WBC/hpf   Bacteria, UA RARE (A) NONE SEEN   Squamous Epithelial / LPF 0-5 0 - 5  I-Stat beta hCG blood, ED  Result Value Ref Range   I-stat hCG, quantitative 8.5 (H) <5 mIU/mL   Comment 3           DG Chest 2 View  Result Date: 06/22/2021 CLINICAL DATA:  Leg pain.  Generalized weakness.  ETOH. EXAM: CHEST - 2 VIEW COMPARISON:  04/03/2021 FINDINGS: The heart size and mediastinal contours are within normal limits. Both lungs are clear. The visualized skeletal structures are unremarkable. IMPRESSION: No active cardiopulmonary disease. Electronically Signed   By: Nolon Nations M.D.   On: 06/22/2021 13:46      Domenic Moras, PA-C 06/22/21 2310    Daleen Bo, MD 06/22/21 [phone]

## 2021-06-22 NOTE — ED Triage Notes (Signed)
[place], c/o bilateral leg pain, denies any trauma. Admits to ETOH use

## 2021-06-22 NOTE — H&P (Signed)
Sherry Baker EEF:[id number] DOB: October 02, 1966 DOA: 06/22/2021     PCP: Default, Provider, MD   Outpatient Specialists:  NONE    Patient arrived to ER on 06/22/21 at 0939 Referred by Attending Toy Baker, MD   Patient coming from:homeless    Chief Complaint:   Chief Complaint  Patient presents with   Leg Pain    HPI: Sherry Baker is a 55 y.o. female with medical history significant of ETOH and Cocaine abuse, HTN, CKD-3, GERD,  bipolar 1, depression, tobacco abuse    [place]ed confused from [place] station reporting bilateral leg pain  Pt reports she feels confused, admits to last ETOH was today some beers. Reports crack cocaine use  Pt reports significant bilateral leg edema that has been interfering with walking and causing her to fall No CP no fever no n/v/D Unsure if taking her meds Reports had a fall yesterday and hit her head slightly on the tree States she takes her BP meds but have not had any today Denies hx of Bipolar  Hx of severe EtOH withdrawal in the past requiring intubation    Has  NOt been vaccinated against COVID     Initial COVID TEST    POSITIVE,     Lab Results  Component Value Date   [place] (A) 06/22/2021   [place] NEGATIVE 03/28/2021   [place] NEGATIVE 06/05/2019   [place] NEGATIVE 05/18/2019     Regarding pertinent Chronic pro    HTN on Norvasc, clonidine  CKD stage IIIa- baseline Cr 1.1 CrCl cannot be calculated (Unknown ideal weight.).  Lab Results  Component Value Date   CREATININE 1.03 (H) 06/22/2021   CREATININE 1.06 (H) 04/05/2021   CREATININE 1.19 (H) 04/03/2021    Chronic anemia - baseline hg Hemoglobin & Hematocrit  Recent Labs    04/03/21 0245 04/05/21 0239 06/22/21 1438  HGB 9.0* 9.0* 11.7*   While in ER: Incidentally found to have COVID no oxygen requirement Alcohol level undetectable Will started on CIWA protocol. Noted to be somewhat hypertensive unclear if has been  compliant with home medications    ED Triage Vitals [06/22/21 0947]  Enc Vitals Group     BP (!) 157/117     Pulse Rate 75     Resp 18     Temp 98 F (36.7 C)     Temp Source Oral     SpO2 100 %     Weight      Height      Head Circumference      Peak Flow      Pain Score      Pain Loc      Pain Edu?      Excl. in [place]?   OITG(54)@     _________________________________________ Significant initial  Findings: Abnormal Labs Reviewed  RESP PANEL BY RT-PCR (FLU A&B, COVID) ARPGX2 - Abnormal; Notable for the following components:      Result Value   SARS Coronavirus 2 by RT PCR POSITIVE (*)    All other components within normal limits  COMPREHENSIVE METABOLIC PANEL - Abnormal; Notable for the following components:   CO2 21 (*)    BUN 22 (*)    Creatinine, Ser 1.03 (*)    Albumin 3.3 (*)    All other components within normal limits  RAPID URINE DRUG SCREEN, HOSP PERFORMED - Abnormal; Notable for the following components:   Cocaine POSITIVE (*)    Tetrahydrocannabinol POSITIVE (*)    All  other components within normal limits  CBC WITH DIFFERENTIAL/PLATELET - Abnormal; Notable for the following components:   WBC 3.4 (*)    Hemoglobin 11.7 (*)    Lymphs Abs 0.6 (*)    All other components within normal limits  SALICYLATE LEVEL - Abnormal; Notable for the following components:   Salicylate Lvl <8.8 (*)    All other components within normal limits  ACETAMINOPHEN LEVEL - Abnormal; Notable for the following components:   Acetaminophen (Tylenol), Serum <10 (*)    All other components within normal limits  LIPASE, BLOOD - Abnormal; Notable for the following components:   Lipase 63 (*)    All other components within normal limits  URINALYSIS, ROUTINE W REFLEX MICROSCOPIC - Abnormal; Notable for the following components:   Color, Urine STRAW (*)    Hgb urine dipstick SMALL (*)    Ketones, ur 5 (*)    Protein, ur 100 (*)    Leukocytes,Ua TRACE (*)    Bacteria, UA RARE (*)    All  other components within normal limits  I-STAT BETA HCG BLOOD, ED ([facility], [facility], [facility] ONLY) - Abnormal; Notable for the following components:   I-stat hCG, quantitative 8.5 (*)    All other components within normal limits   ____________________________________________ Ordered CT HEAD   NON acute  CXR -  NON acute   _________________________   ECG: Ordered Personally reviewed by me showing: HR : 53 Rhythm:     Sinus bradycardia    no evidence of ischemic changes QTC 462  The recent clinical data is shown below. Vitals:   06/22/21 2130 06/22/21 2145 06/22/21 2200 06/22/21 2215  BP: (!) 175/89 (!) 190/107 (!) 169/85 (!) 194/114  Pulse:    83  Resp:    20  Temp:      TempSrc:      SpO2:    94%    WBC     Component Value Date/Time   WBC 3.4 (L) 06/22/2021 1438   LYMPHSABS 0.6 (L) 06/22/2021 1438   LYMPHSABS 0.9 11/15/2020 1153   LYMPHSABS 2.2 10/07/2017 1335   MONOABS 0.3 06/22/2021 1438   MONOABS 1.0 (H) 10/07/2017 1335   EOSABS 0.1 06/22/2021 1438   EOSABS 0.1 11/15/2020 1153   BASOSABS 0.0 06/22/2021 1438   BASOSABS 0.0 11/15/2020 1153   BASOSABS 0.0 10/07/2017 1335       UA  no evidence of UTI      Urine analysis:    Component Value Date/Time   COLORURINE STRAW (A) 06/22/2021 2014   APPEARANCEUR CLEAR 06/22/2021 2014   LABSPEC 1.009 06/22/2021 2014   PHURINE 6.0 06/22/2021 2014   GLUCOSEU NEGATIVE 06/22/2021 2014   HGBUR SMALL (A) 06/22/2021 2014   BILIRUBINUR NEGATIVE 06/22/2021 2014   KETONESUR 5 (A) 06/22/2021 2014   PROTEINUR 100 (A) 06/22/2021 2014   UROBILINOGEN 0.2 12/23/2017 1417   NITRITE NEGATIVE 06/22/2021 2014   LEUKOCYTESUR TRACE (A) 06/22/2021 2014    Results for orders placed or performed during the hospital encounter of 06/22/21  Resp Panel by RT-PCR (Flu A&B, Covid) Nasopharyngeal Swab     Status: Abnormal   Collection Time: 06/22/21  2:48 PM   Specimen: Nasopharyngeal Swab; Nasopharyngeal(NP) swabs in vial transport medium  Result Value Ref  Range Status   SARS Coronavirus 2 by RT PCR POSITIVE (A) NEGATIVE Final         Influenza A by PCR NEGATIVE NEGATIVE Final   Influenza B by PCR NEGATIVE NEGATIVE Final  _______________________________________________________  _______________________________________________ Hospitalist was called for admission for Alcohol withdrawal  The following Work up has been ordered so far:  Orders Placed This Encounter  Procedures   Resp Panel by RT-PCR (Flu A&B, Covid) Nasopharyngeal Swab   DG Chest 2 View   Comprehensive metabolic panel   Ethanol   Urine rapid drug screen (hosp performed)   CBC with Diff   Salicylate level   Acetaminophen level   Lipase, blood   Urinalysis, Routine w reflex microscopic   Diet regular Room service appropriate? Yes; Fluid consistency: Thin   Initiate Carrier Fluid Protocol   Clinical institute withdrawal assessment   Vital signs   Notify physician (specify)   Pharmacy Tech to prioritize PTA Med Rec   Cardiac monitoring   Full code   Consult to hospitalist   I-Stat beta hCG blood, ED   ED EKG   EKG 12-Lead   Place in observation (patient's expected length of stay will be less than 2 midnights)     Following Medications were ordered in ER: Medications  risperiDONE (RISPERDAL M-TABS) disintegrating tablet 2 mg (has no administration in time range)    And  LORazepam (ATIVAN) tablet 1 mg (has no administration in time range)    And  ziprasidone (GEODON) injection 20 mg (has no administration in time range)  acetaminophen (TYLENOL) tablet 650 mg (has no administration in time range)  zolpidem (AMBIEN) tablet 5 mg (has no administration in time range)  ondansetron (ZOFRAN) tablet 4 mg (has no administration in time range)  alum & mag hydroxide-simeth (MAALOX/MYLANTA) 200-200-20 MG/5ML suspension 30 mL (has no administration in time range)  nicotine (NICODERM CQ - dosed in mg/24 hours) patch 21 mg (21 mg Transdermal Patch Applied 06/22/21  2210)  dexmedetomidine (PRECEDEX) 200 MCG/50ML (4 mcg/mL) infusion (has no administration in time range)  thiamine tablet 100 mg (has no administration in time range)  midazolam (VERSED) injection 1-2 mg (has no administration in time range)  folic acid (FOLVITE) tablet 1 mg (has no administration in time range)  multivitamin with minerals tablet 1 tablet (has no administration in time range)        Consult Orders  (From admission, onward)           Start     Ordered   06/22/21 2241  Consult to hospitalist  Once       Provider:  (Not yet assigned)  Question Answer Comment  Place call to: [facility]   Reason for Consult Admit      06/22/21 2240             OTHER Significant initial  Findings:  labs showing:    Recent Labs  Lab 06/22/21 1438  NA 139  K 3.5  CO2 21*  GLUCOSE 71  BUN 22*  CREATININE 1.03*  CALCIUM 9.1    Cr   stable,   Lab Results  Component Value Date   CREATININE 1.03 (H) 06/22/2021   CREATININE 1.06 (H) 04/05/2021   CREATININE 1.19 (H) 04/03/2021    Recent Labs  Lab 06/22/21 1438  AST 40  ALT 27  ALKPHOS 79  BILITOT 0.7  PROT 7.8  ALBUMIN 3.3*   Lab Results  Component Value Date   CALCIUM 9.1 06/22/2021   PHOS 4.3 04/05/2021        Plt: Lab Results  Component Value Date   PLT 166 06/22/2021    COVID-19 Labs  No results for input(s): DDIMER, FERRITIN, LDH, CRP in the last  72 hours.  Lab Results  Component Value Date   SARSCOV2NAA POSITIVE (A) 06/22/2021   SARSCOV2NAA NEGATIVE 03/28/2021   SARSCOV2NAA NEGATIVE 06/05/2019   SARSCOV2NAA NEGATIVE 05/18/2019    Recent Labs  Lab 06/22/21 1438  WBC 3.4*  NEUTROABS 2.4  HGB 11.7*  HCT 36.1  MCV 88.7  PLT 166    HG/HCT    Up from baseline see below    Component Value Date/Time   HGB 11.7 (L) 06/22/2021 1438   HGB 12.6 11/15/2020 1153   HGB 9.7 (L) 10/07/2017 1335   HCT 36.1 06/22/2021 1438   HCT 37.6 11/15/2020 1153   HCT 31.5 (L) 10/07/2017 1335    MCV 88.7 06/22/2021 1438   MCV 92 11/15/2020 1153   MCV 74.2 (L) 10/07/2017 1335      Recent Labs  Lab 06/22/21 1438  LIPASE 63*   No results for input(s): AMMONIA in the last 168 hours.   Cardiac Panel (last 3 results) No results for input(s): CKTOTAL, CKMB, TROPONINI, RELINDX in the last 72 hours.   BNP (last 3 results) Recent Labs    03/30/21 0153  BNP 231.8*      DM  labs:  HbA1C: No results for input(s): HGBA1C in the last 8760 hours.     CBG (last 3)  No results for input(s): GLUCAP in the last 72 hours.        Cultures:    Component Value Date/Time   SDES TRACHEAL ASPIRATE 03/31/2021 0819   SPECREQUEST NONE 03/31/2021 0819   CULT  03/31/2021 0819    ABUNDANT STAPHYLOCOCCUS AUREUS FEW STREPTOCOCCUS PNEUMONIAE    REPTSTATUS 04/03/2021 FINAL 03/31/2021 0819     Radiological Exams on Admission: DG Chest 2 View  Result Date: 06/22/2021 CLINICAL DATA:  Leg pain.  Generalized weakness.  ETOH. EXAM: CHEST - 2 VIEW COMPARISON:  04/03/2021 FINDINGS: The heart size and mediastinal contours are within normal limits. Both lungs are clear. The visualized skeletal structures are unremarkable. IMPRESSION: No active cardiopulmonary disease. Electronically Signed   By: Nolon Nations M.D.   On: 06/22/2021 13:46   _______________________________________________________________________________________________________ Latest  Blood pressure (!) 194/114, pulse 83, temperature 98 F (36.7 C), temperature source Oral, resp. rate 20, SpO2 94 %.   Review of Systems:    Pertinent positives include:   Bilateral lower extremity swelling   Constitutional:  No weight loss, night sweats, Fevers, chills, fatigue, weight loss  HEENT:  No headaches, Difficulty swallowing,Tooth/dental problems,Sore throat,  No sneezing, itching, ear ache, nasal congestion, post nasal drip,  Cardio-vascular:  No chest pain, Orthopnea, PND, anasarca, dizziness, palpitations.no GI:  No  heartburn, indigestion, abdominal pain, nausea, vomiting, diarrhea, change in bowel habits, loss of appetite, melena, blood in stool, hematemesis Resp:  no shortness of breath at rest. No dyspnea on exertion, No excess mucus, no productive cough, No non-productive cough, No coughing up of blood.No change in color of mucus.No wheezing. Skin:  no rash or lesions. No jaundice GU:  no dysuria, change in color of urine, no urgency or frequency. No straining to urinate.  No flank pain.  Musculoskeletal:  No joint pain or no joint swelling. No decreased range of motion. No back pain.  Psych:  No change in mood or affect. No depression or anxiety. No memory loss.  Neuro: no localizing neurological complaints, no tingling, no weakness, no double vision, no gait abnormality, no slurred speech, no confusion  All systems reviewed and apart from [place] all are negative _______________________________________________________________________________________________ Past Medical History:  Past Medical History:  Diagnosis Date   Alcohol abuse    Allergy    PCNS swelling   Arthritis    Bipolar 1 disorder ([place])    Cancer ([place]) 01/21/2017   rectal cancer   Cancer ([place])    Cirrhosis of liver ([place])    Depression    Genetic testing 03/24/2017   Ms. Deeg underwent genetic counseling and testing for hereditary cancer syndromes on 02/17/2017. Her results were negative for mutations in all 46 genes analyzed by [facility]'s 46-gene Common Hereditary Cancers Panel. Genes analyzed include: APC, ATM, AXIN2, BARD1, BMPR1A, BRCA1, BRCA2, BRIP1, CDH1, CDKN2A, CHEK2, CTNNA1, DICER1, EPCAM, GREM1, HOXB13, KIT, MEN1, MLH1, MSH2, MSH3, MSH6, MUTYH, NBN,   Hypertension       Past Surgical History:  Procedure Laterality Date   ABDOMINAL PERINEAL BOWEL RESECTION N/A 06/18/2017   Procedure: ABDOMINAL PERINEAL RESECTION ERAS PATHWAY;  Surgeon: Leighton Ruff, MD;  Location: [facility];  Service: General;  Laterality: N/A;    COLON SURGERY     COLONOSCOPY WITH PROPOFOL Left 01/21/2017   Procedure: COLONOSCOPY WITH PROPOFOL;  Surgeon: Otis Brace, MD;  Location: [facility];  Service: Gastroenterology;  Laterality: Left;   FRACTURE SURGERY     MANDIBLE FRACTURE SURGERY      Social History:  Ambulatory   independently       reports that she has been smoking cigarettes. She has a 19.50 pack-year smoking history. She has never used smokeless tobacco. She reports current alcohol use. She reports current drug use. Drugs: "Crack" cocaine and Cocaine.   Family History:   Family History  Problem Relation Age of Onset   Cancer Mother 67       Originating in the abdomen, otherwise unknwon   Cancer - Other Maternal Aunt        Throat   ______________________________________________________________________________________________ Allergies: Allergies  Allergen Reactions   Penicillins Hives and Swelling    Did it involve swelling of the face/tongue/throat, SOB, or low BP? Yes Did it involve sudden or severe rash/hives, skin peeling, or any reaction on the inside of your mouth or nose? No Did you need to seek medical attention at a hospital or doctor's office? No When did it last happen?  <10 years   If all above answers are "NO", may proceed with cephalosporin use.    Penicillins Hives and Swelling    Has patient had a PCN reaction causing immediate rash, facial/tongue/throat swelling, SOB or lightheadedness with hypotension: Yes Has patient had a PCN reaction causing severe rash involving mucus membranes or skin necrosis: No Has patient had a PCN reaction that required hospitalization No Has patient had a PCN reaction occurring within the last 10 years: Yes If all of the above answers are "NO", then may proceed with Cephalosporin use.      Prior to Admission medications   Medication Sig Start Date End Date Taking? Authorizing Provider  amLODipine (NORVASC) 10 MG tablet Take 1 tablet (10 mg total) by  mouth daily. 11/15/20   Argentina Donovan, PA-C  busPIRone (BUSPAR) 15 MG tablet Take 1 tablet (15 mg total) by mouth 2 (two) times daily. 11/15/20   Argentina Donovan, PA-C  citalopram (CELEXA) 10 MG tablet Take 1 tablet (10 mg total) by mouth daily. 04/09/21   Pokhrel, Corrie Mckusick, MD  cloNIDine (CATAPRES) 0.1 MG tablet Take 1 tablet (0.1 mg total) by mouth 3 (three) times daily. 11/15/20   Argentina Donovan, PA-C  gabapentin (NEURONTIN) 100 MG capsule Take 1 capsule (  100 mg total) by mouth 3 (three) times daily. 04/08/21   Pokhrel, Corrie Mckusick, MD  hydrOXYzine (ATARAX/VISTARIL) 10 MG tablet Take 1 tablet (10 mg total) by mouth 3 (three) times daily as needed. As needed for acute anxiety 11/15/20   Argentina Donovan, PA-C  Multiple Vitamin (MULTIVITAMIN WITH MINERALS) TABS tablet Place 1 tablet into feeding tube daily. 04/09/21   Pokhrel, Corrie Mckusick, MD  omeprazole (PRILOSEC) 40 MG capsule Take 1 capsule (40 mg total) by mouth daily. 11/15/20   Argentina Donovan, PA-C  ziprasidone (GEODON) 40 MG capsule Take 1 capsule (40 mg total) by mouth 2 (two) times daily with a meal. 11/06/11 01/09/12  Daleen Bo, MD    ___________________________________________________________________________________________________ Physical Exam: Vitals with BMI 06/22/2021 06/22/2021 06/22/2021  Height - - -  Weight - - -  BMI - - -  Systolic [phone]  Diastolic 378 85 588  Pulse 83 - -     1. General:  in No  Acute distress    Chronically ill  -appearing 2. Psychological: Alert and   Oriented, somewhat confused 3. Head/ENT:   Dry Mucous Membranes                          Head Non traumatic, neck supple                            Poor Dentition 4. SKIN:  decreased Skin turgor,  Skin clean Dry  abrasion on right arm noted 5. Heart: Regular rate and rhythm no  Murmur,  crackles   7. Abdomen: Soft,  non-tender, Non distended  bowel sounds present 8. Lower extremities: no clubbing, cyanosis,  bilateral leg edema  9. Neurologically  Grossly intact, moving all 4 extremities equally mildly tremulous 10. MSK: Normal range of motion    Chart has been reviewed  ______________________________________________________________________________________________  Assessment/Plan  55 y.o. female with medical history significant of ETOH and Cocaine abuse, HTN, CKD-3, GERD,  bipolar 1, depression, tobacco abuse   Admitted for  alcohol withdrawal  Present on Admission:  Alcohol withdrawal delirium ([place]) - order CIWA protocol admit to stepdown pt had history of severe withdrawal in the past   Tobacco abuse -  - Spoke about importance of quitting spent 5 minutes discussing options for treatment, prior attempts at quitting, and dangers of smoking  -At this point patient is  NOT  interested in quitting  - order nicotine patch   - nursing tobacco cessation protocol   Substance abuse ([place])  Cocaine abuse (HCC)-spoke about importance of discontinuing cocaine abuse.  Obtain transitional care consult   Normochromic normocytic anemia -check anemia panel   Hypertension -restart clonidine and  Norvasc   GERD (gastroesophageal reflux disease) -chronic stable    CKD (chronic kidney disease), stage III (HCC) -  -chronic avoid nephrotoxic medications such as NSAIDs, Vanco Zosyn combo,  avoid hypotension, continue to follow renal function  Evidence of proteinuria with following down albumin.  Could consider nephrotic syndrome in the setting of substance abuse Importance of discontinuing polysubstance abuse has been discussed Hypoalbuminemia likely contributing to lower extremity edema.  For completion we will add on BNP if elevated may need echogram  COVID positive - COVID infection -incidental finding patient is  not vaccinated    not boosted  So far asymptomatic no evidence of hypoxia Obtain inflammatory markers Supportive measures Avoid over aggressive fluid resuscitation Airborne precautions Given high risk  due to substance abuse  will, order Paxlovid  Other plan as per orders.  DVT prophylaxis:  SCD      Code Status:    Code Status: Full Code FULL CODE as per patient    Family Communication:   Family not at  Bedside    Disposition Plan:    To home once workup is complete and patient is stable   Following barriers for discharge:                            Electrolytes corrected                               Anemia stable                                                     Will need to be able to tolerate PO       Would benefit from PT/OT eval prior to DC  Ordered                                      Transition of care consulted                   Nutrition    consulted                   Consults called: none  Admission status:  ED Disposition     ED Disposition  Admit   Condition  --   [place]: [place] [[id number]]  Level of Care: Stepdown [14]  Admit to SDU based on following criteria: Severe physiological/psychological symptoms:  Any diagnosis requiring assessment & intervention at least every 4 hours on an ongoing basis to obtain desired patient outcomes including stability and rehabilitation  May place patient in observation at [facility] or [place] if equivalent level of care is available:: No  Covid Evaluation: Confirmed COVID Positive  Diagnosis: Alcohol withdrawal delirium ([place]) [291.0.ICD-9-CM]  Admitting Physician: Toy Baker [[id number]]  Attending Physician: Toy Baker [[id number]]          Obs    Level of care         SDU tele indefinitely please discontinue once patient no longer qualifies COVID-19 Labs     Lab Results  Component Value Date   [place] (A) 06/22/2021     Precautions: admitted as  covid positive Airborne and Contact precautions    PPE: Used by the provider:   N95  eye Goggles,  Gloves     Elster Corbello 06/23/2021, 1:03 AM    [facility]     after 2 AM please page floor coverage  PA If 7AM-7PM, please contact the day team taking care of the patient using [email or web address]   Patient was evaluated in the context of the global COVID-19 pandemic, which necessitated consideration that the patient might be at risk for infection with the SARS-CoV-2 virus that causes COVID-19. Institutional protocols and algorithms that pertain to the evaluation of patients at risk for COVID-19 are in a state of rapid change based on information released by regulatory bodies including the CDC  and federal and state organizations. These policies and algorithms were followed during the patient's care.

## 2021-06-23 DIAGNOSIS — F14951 Cocaine use, unspecified with cocaine-induced psychotic disorder with hallucinations: Secondary | ICD-10-CM | POA: Diagnosis not present

## 2021-06-23 DIAGNOSIS — G929 Unspecified toxic encephalopathy: Secondary | ICD-10-CM

## 2021-06-23 DIAGNOSIS — R627 Adult failure to thrive: Secondary | ICD-10-CM | POA: Diagnosis present

## 2021-06-23 DIAGNOSIS — L89150 Pressure ulcer of sacral region, unstageable: Secondary | ICD-10-CM | POA: Diagnosis present

## 2021-06-23 DIAGNOSIS — E43 Unspecified severe protein-calorie malnutrition: Secondary | ICD-10-CM | POA: Diagnosis present

## 2021-06-23 DIAGNOSIS — I129 Hypertensive chronic kidney disease with stage 1 through stage 4 chronic kidney disease, or unspecified chronic kidney disease: Secondary | ICD-10-CM | POA: Diagnosis present

## 2021-06-23 DIAGNOSIS — N182 Chronic kidney disease, stage 2 (mild): Secondary | ICD-10-CM

## 2021-06-23 DIAGNOSIS — K7030 Alcoholic cirrhosis of liver without ascites: Secondary | ICD-10-CM

## 2021-06-23 DIAGNOSIS — F10231 Alcohol dependence with withdrawal delirium: Secondary | ICD-10-CM | POA: Diagnosis present

## 2021-06-23 DIAGNOSIS — R64 Cachexia: Secondary | ICD-10-CM | POA: Diagnosis present

## 2021-06-23 DIAGNOSIS — Z933 Colostomy status: Secondary | ICD-10-CM | POA: Diagnosis not present

## 2021-06-23 DIAGNOSIS — F14159 Cocaine abuse with cocaine-induced psychotic disorder, unspecified: Secondary | ICD-10-CM | POA: Diagnosis present

## 2021-06-23 DIAGNOSIS — U071 COVID-19: Secondary | ICD-10-CM

## 2021-06-23 DIAGNOSIS — F17210 Nicotine dependence, cigarettes, uncomplicated: Secondary | ICD-10-CM | POA: Diagnosis present

## 2021-06-23 DIAGNOSIS — N179 Acute kidney failure, unspecified: Secondary | ICD-10-CM | POA: Diagnosis not present

## 2021-06-23 DIAGNOSIS — Z809 Family history of malignant neoplasm, unspecified: Secondary | ICD-10-CM | POA: Diagnosis not present

## 2021-06-23 DIAGNOSIS — I10 Essential (primary) hypertension: Secondary | ICD-10-CM | POA: Diagnosis not present

## 2021-06-23 DIAGNOSIS — Z5900 Homelessness unspecified: Secondary | ICD-10-CM

## 2021-06-23 DIAGNOSIS — M79605 Pain in left leg: Secondary | ICD-10-CM | POA: Diagnosis not present

## 2021-06-23 DIAGNOSIS — F1410 Cocaine abuse, uncomplicated: Secondary | ICD-10-CM

## 2021-06-23 DIAGNOSIS — D631 Anemia in chronic kidney disease: Secondary | ICD-10-CM | POA: Diagnosis present

## 2021-06-23 DIAGNOSIS — K219 Gastro-esophageal reflux disease without esophagitis: Secondary | ICD-10-CM | POA: Diagnosis present

## 2021-06-23 DIAGNOSIS — Z28310 Unvaccinated for covid-19: Secondary | ICD-10-CM | POA: Diagnosis not present

## 2021-06-23 DIAGNOSIS — E860 Dehydration: Secondary | ICD-10-CM | POA: Diagnosis not present

## 2021-06-23 DIAGNOSIS — Z681 Body mass index (BMI) 19 or less, adult: Secondary | ICD-10-CM | POA: Diagnosis not present

## 2021-06-23 DIAGNOSIS — M1990 Unspecified osteoarthritis, unspecified site: Secondary | ICD-10-CM | POA: Diagnosis present

## 2021-06-23 DIAGNOSIS — Z85048 Personal history of other malignant neoplasm of rectum, rectosigmoid junction, and anus: Secondary | ICD-10-CM | POA: Diagnosis not present

## 2021-06-23 DIAGNOSIS — R54 Age-related physical debility: Secondary | ICD-10-CM | POA: Diagnosis present

## 2021-06-23 DIAGNOSIS — G9340 Encephalopathy, unspecified: Secondary | ICD-10-CM | POA: Diagnosis not present

## 2021-06-23 HISTORY — DX: COVID-19: U07.1

## 2021-06-23 LAB — BASIC METABOLIC PANEL
Anion gap: 12 (ref 5–15)
BUN: 21 mg/dL — ABNORMAL HIGH (ref 6–20)
CO2: 19 mmol/L — ABNORMAL LOW (ref 22–32)
Calcium: 9.2 mg/dL (ref 8.9–10.3)
Chloride: 106 mmol/L (ref 98–111)
Creatinine, Ser: 1.06 mg/dL — ABNORMAL HIGH (ref 0.44–1.00)
GFR, Estimated: >60 mL/min (ref >60)
Glucose, Bld: 56 mg/dL — ABNORMAL LOW (ref 70–99)
Potassium: 3.5 mmol/L (ref 3.5–5.1)
Sodium: 137 mmol/L (ref 135–145)

## 2021-06-23 LAB — BRAIN NATRIURETIC PEPTIDE: B Natriuretic Peptide: 485.8 pg/mL — ABNORMAL HIGH (ref 0.0–100.0)

## 2021-06-23 LAB — GLUCOSE, CAPILLARY
Glucose-Capillary: 110 mg/dL — ABNORMAL HIGH (ref 70–99)
Glucose-Capillary: 111 mg/dL — ABNORMAL HIGH (ref 70–99)
Glucose-Capillary: 122 mg/dL — ABNORMAL HIGH (ref 70–99)
Glucose-Capillary: 157 mg/dL — ABNORMAL HIGH (ref 70–99)
Glucose-Capillary: 57 mg/dL — ABNORMAL LOW (ref 70–99)
Glucose-Capillary: 78 mg/dL (ref 70–99)
Glucose-Capillary: 88 mg/dL (ref 70–99)

## 2021-06-23 LAB — MAGNESIUM: Magnesium: 2 mg/dL (ref 1.7–2.4)

## 2021-06-23 LAB — IRON AND TIBC
Iron: 74 ug/dL (ref 28–170)
Saturation Ratios: 20 % (ref 10.4–31.8)
TIBC: 378 ug/dL (ref 250–450)
UIBC: 304 ug/dL

## 2021-06-23 LAB — PHOSPHORUS: Phosphorus: 3.3 mg/dL (ref 2.5–4.6)

## 2021-06-23 LAB — PROTIME-INR
INR: 1 (ref 0.8–1.2)
Prothrombin Time: 13.3 seconds (ref 11.4–15.2)

## 2021-06-23 LAB — D-DIMER, QUANTITATIVE: D-Dimer, Quant: 0.8 ug/mL-FEU — ABNORMAL HIGH (ref 0.00–0.50)

## 2021-06-23 LAB — FIBRINOGEN: Fibrinogen: 450 mg/dL (ref 210–475)

## 2021-06-23 LAB — FERRITIN
Ferritin: 27 ng/mL (ref 11–307)
Ferritin: 29 ng/mL (ref 11–307)

## 2021-06-23 LAB — RETICULOCYTES
Immature Retic Fract: 29.3 % — ABNORMAL HIGH (ref 2.3–15.9)
RBC.: 4.29 MIL/uL (ref 3.87–5.11)
Retic Count, Absolute: 51.5 10*3/uL (ref 19.0–186.0)
Retic Ct Pct: 1.2 % (ref 0.4–3.1)

## 2021-06-23 LAB — TSH: TSH: 3.564 u[IU]/mL (ref 0.350–4.500)

## 2021-06-23 LAB — PROCALCITONIN: Procalcitonin: <0.1 ng/mL

## 2021-06-23 LAB — C-REACTIVE PROTEIN: CRP: 0.6 mg/dL (ref <1.0)

## 2021-06-23 LAB — PREALBUMIN: Prealbumin: 21.3 mg/dL (ref 18–38)

## 2021-06-23 LAB — VITAMIN B12: Vitamin B-12: 176 pg/mL — ABNORMAL LOW (ref 180–914)

## 2021-06-23 LAB — CK: Total CK: 71 U/L (ref 38–234)

## 2021-06-23 LAB — LACTATE DEHYDROGENASE: LDH: 171 U/L (ref 98–192)

## 2021-06-23 LAB — LACTIC ACID, PLASMA: Lactic Acid, Venous: 0.8 mmol/L (ref 0.5–1.9)

## 2021-06-23 LAB — MRSA NEXT GEN BY PCR, NASAL: MRSA by PCR Next Gen: NOT DETECTED

## 2021-06-23 LAB — AMMONIA: Ammonia: 25 umol/L (ref 9–35)

## 2021-06-23 LAB — FOLATE: Folate: 16.6 ng/mL (ref >5.9)

## 2021-06-23 MED ORDER — CLONIDINE HCL 0.1 MG PO TABS
0.1000 mg | ORAL_TABLET | Freq: Once | ORAL | Status: AC
  Administered 2021-06-23: 0.1 mg via ORAL
  Filled 2021-06-23: qty 1

## 2021-06-23 MED ORDER — LORAZEPAM 1 MG PO TABS
1.0000 mg | ORAL_TABLET | ORAL | Status: DC | PRN
  Administered 2021-06-23: 1 mg via ORAL
  Administered 2021-06-23: 2 mg via ORAL
  Administered 2021-06-23: 1 mg via ORAL
  Administered 2021-06-23: 2 mg via ORAL
  Administered 2021-06-24: 4 mg via ORAL
  Administered 2021-06-24 (×2): 3 mg via ORAL
  Administered 2021-06-24 – 2021-06-25 (×3): 4 mg via ORAL
  Filled 2021-06-23: qty 4
  Filled 2021-06-23: qty 3
  Filled 2021-06-23: qty 1
  Filled 2021-06-23: qty 4
  Filled 2021-06-23: qty 3
  Filled 2021-06-23: qty 4
  Filled 2021-06-23 (×2): qty 2
  Filled 2021-06-23: qty 4
  Filled 2021-06-23: qty 1

## 2021-06-23 MED ORDER — NIRMATRELVIR/RITONAVIR (PAXLOVID)TABLET
3.0000 | ORAL_TABLET | Freq: Two times a day (BID) | ORAL | Status: AC
Start: 2021-06-23 — End: 2021-06-27
  Administered 2021-06-23 – 2021-06-27 (×10): 3 via ORAL
  Filled 2021-06-23 (×3): qty 30

## 2021-06-23 MED ORDER — DEXTROSE 50 % IV SOLN: 12.5000 g | INTRAVENOUS | Status: AC

## 2021-06-23 MED ORDER — THIAMINE HCL 100 MG/ML IJ SOLN
100.0000 mg | Freq: Every day | INTRAMUSCULAR | Status: DC
  Filled 2021-06-23: qty 2

## 2021-06-23 MED ORDER — CHLORHEXIDINE GLUCONATE CLOTH 2 % EX PADS
6.0000 | MEDICATED_PAD | Freq: Every day | CUTANEOUS | Status: DC
  Administered 2021-06-23 – 2021-06-30 (×10): 6 via TOPICAL

## 2021-06-23 MED ORDER — ORAL CARE MOUTH RINSE
15.0000 mL | Freq: Two times a day (BID) | OROMUCOSAL | Status: DC
  Administered 2021-06-23 – 2021-06-30 (×14): 15 mL via OROMUCOSAL

## 2021-06-23 MED ORDER — CLONIDINE HCL 0.1 MG PO TABS
0.1000 mg | ORAL_TABLET | Freq: Three times a day (TID) | ORAL | Status: DC
  Administered 2021-06-23: 0.1 mg via ORAL
  Filled 2021-06-23: qty 1

## 2021-06-23 MED ORDER — SODIUM CHLORIDE 0.9 % IV SOLN: INTRAVENOUS | Status: DC

## 2021-06-23 MED ORDER — HYDROCODONE-ACETAMINOPHEN 5-325 MG PO TABS
1.0000 | ORAL_TABLET | ORAL | Status: DC | PRN
  Administered 2021-06-23: 2 via ORAL
  Administered 2021-06-23: 1 via ORAL
  Filled 2021-06-23: qty 2
  Filled 2021-06-23: qty 1

## 2021-06-23 MED ORDER — AMLODIPINE BESYLATE 10 MG PO TABS
10.0000 mg | ORAL_TABLET | Freq: Every day | ORAL | Status: DC
  Administered 2021-06-23: 10 mg via ORAL
  Filled 2021-06-23: qty 1

## 2021-06-23 MED ORDER — DEXTROSE-NACL 5-0.45 % IV SOLN: INTRAVENOUS | Status: DC

## 2021-06-23 MED ORDER — DEXTROSE 50 % IV SOLN
INTRAVENOUS | Status: AC
  Administered 2021-06-23: 12.5 g via INTRAVENOUS
  Filled 2021-06-23: qty 50

## 2021-06-23 MED ORDER — THIAMINE HCL 100 MG PO TABS
100.0000 mg | ORAL_TABLET | Freq: Every day | ORAL | Status: DC
  Administered 2021-06-23 – 2021-06-30 (×8): 100 mg via ORAL
  Filled 2021-06-23 (×8): qty 1

## 2021-06-23 MED ORDER — ACETAMINOPHEN 650 MG RE SUPP: 650.0000 mg | Freq: Four times a day (QID) | RECTAL | Status: DC | PRN

## 2021-06-23 MED ORDER — LORAZEPAM 2 MG/ML IJ SOLN
1.0000 mg | INTRAMUSCULAR | Status: DC | PRN
  Administered 2021-06-24: 3 mg via INTRAVENOUS
  Administered 2021-06-24: 4 mg via INTRAVENOUS
  Administered 2021-06-24: 3 mg via INTRAVENOUS
  Administered 2021-06-25: 4 mg via INTRAVENOUS
  Administered 2021-06-25: 3 mg via INTRAVENOUS
  Filled 2021-06-23 (×6): qty 2

## 2021-06-23 MED ORDER — ACETAMINOPHEN 325 MG PO TABS
650.0000 mg | ORAL_TABLET | Freq: Four times a day (QID) | ORAL | Status: DC | PRN
  Administered 2021-06-24 – 2021-06-30 (×10): 650 mg via ORAL
  Filled 2021-06-23 (×11): qty 2

## 2021-06-23 NOTE — Assessment & Plan Note (Addendum)
--   Probably multifactorial including crack cocaine, alcohol, withdrawal from alcohol --supportive care

## 2021-06-23 NOTE — Progress Notes (Signed)
She for PROGRESS NOTE  Sherry Baker [id number] DOB: 03/25/1966 DOA: 06/22/2021 PCP: Default, Provider, MD  Brief History   55 year old woman PMH including alcohol use, crack cocaine use, cirrhosis [place] for [place] pain, confusion "out of her mind".  Last drink was that morning.  Recent crack cocaine use.  Admitted for acute alcohol withdrawal delirium, suspected drug-induced psychosis.  A & P  * Alcohol withdrawal delirium (HCC) -- Appears stable at this point.  Continue CIWA.  Monitor for delirium tremens.  Toxic encephalopathy -- Probably multifactorial including crack cocaine, alcohol, withdrawal from alcohol --supportive care  Cocaine abuse ([place]) -- Suspect playing a role in toxic encephalopathy.  Recommend cessation.  TOC consultation.  Substance abuse ([place]) --TOC consultation  COVID-19 virus infection -- Asymptomatic, no hypoxia.  Chest x-ray no acute disease.  CRP within normal limits.  D-dimer modestly elevated likely related to COVID.  Paxlovid.  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 [place] assessment 06/06/19, this is an old surgical wound that did not heal based on location  (Active)  06/06/19 0500  Location: Sacrum  Location Orientation: Posterior  Staging: Stage IV - Full thickness tissue loss with exposed bone, tendon or muscle.  Wound Description (Comments): WOC assessment 06/06/19, this is an old surgical wound that did not heal based on location   Present on Admission: Yes     Pressure Injury Perineum Deep Tissue Injury - Purple or maroon localized  area of discolored intact skin or blood-filled blister due to damage of underlying soft tissue from pressure and/or shear. Bordering stage IV pressure injury (Active)     Location: Perineum  Location Orientation:   Staging: Deep Tissue Injury - Purple or maroon localized area of discolored intact skin or blood-filled blister due to damage of underlying soft tissue from pressure and/or shear.  Wound Description (Comments): Bordering stage IV pressure injury  Present on Admission: Yes     Pressure Injury 03/29/21 Coccyx Mid;Upper Unstageable - Full thickness tissue loss in which the base of the injury is covered by slough (yellow, tan, gray, green or brown) and/or eschar (tan, brown or black) in the wound bed. previously healed wound (Active)  03/29/21 1915  Location: Coccyx  Location Orientation: Mid;Upper  Staging: Unstageable - Full thickness tissue loss in which the base of the injury is covered by slough (yellow, tan, gray, green or brown) and/or eschar (tan, brown or black) in the wound bed.  Wound Description (Comments): previously healed wound  Present on Admission: Yes     Pressure Injury 06/23/21 Coccyx Lower;Medial Unstageable - Full thickness tissue loss in which the base of the injury is covered by slough (yellow, tan, gray, green or brown) and/or eschar (tan, brown or black) in the wound bed. (Active)  06/23/21 0100  Location: Coccyx  Location Orientation: Lower;Medial  Staging: Unstageable - Full thickness tissue loss in which the base of the injury is covered by slough (yellow, tan, gray, green or brown) and/or eschar (tan, brown or black) in the wound bed.  Wound Description (Comments):   Present on Admission: Yes   Disposition Plan:  Discussion:  Status is: Inpatient  Remains inpatient appropriate because:IV treatments appropriate due to intensity of illness or inability to take PO and Inpatient level of care appropriate due to severity of illness  Dispo: The patient  is from: Home              Anticipated d/c is to: Home              Patient currently is not medically stable to d/c.   Difficult to place patient No  DVT prophylaxis: SCDs Start: 06/23/21 0152   Code Status: Full Code Level of care: Stepdown Family Communication: none  Murray Hodgkins, MD  [facility] Direct contact: see [email or web address] (further directions at bottom of note if needed) 7PM-7AM contact night coverage as at bottom of note 06/23/2021, 2:42 PM  LOS: 0 days   Significant Hospital Events   8/20 admit for alcohol withdrawal   Consults:  None    Procedures:  None   Significant Diagnostic Tests:  8/21 CT head NAD 8/21 CXR NAD   Micro Data:  COVID+ MRSA-   Antimicrobials:  None  Interval History/Subjective  CC: f/u alcohol withdrawal  Feels okay today, tremulous   Objective   Vitals:  Vitals:   06/23/21 1200 06/23/21 1300  BP: (!) 142/69   Pulse: (!) 57 (!) 54  Resp: 18 20  Temp: 98.2 F (36.8 C)   SpO2: 93% 93%    Exam: Physical Exam Vitals reviewed.  Constitutional:      Appearance: She is ill-appearing. She is not toxic-appearing.  Cardiovascular:     Rate and Rhythm: Normal rate and regular rhythm.     Heart sounds: No murmur heard. Pulmonary:     Effort: Pulmonary effort is normal. No respiratory distress.     Breath sounds: Normal breath sounds. No wheezing, rhonchi or rales.  Abdominal:     General: Abdomen is flat. There is no distension.     Tenderness: There is no abdominal tenderness. There is no guarding.     Comments: Colostomy in place  Musculoskeletal:     Right lower leg: No edema.     Left lower leg: No edema.  Neurological:     Mental Status: She is alert.  Psychiatric:        Mood and Affect: Mood normal.        Behavior: Behavior normal.     Comments: Mildly confused   I have personally reviewed the labs and other data, making special note of:   Today's Data  CBG stable BMP noted CRP WNL Procalcitonin  negative Ddimer modest elevation in setting of COVID  Scheduled Meds:  amLODipine  10 mg Oral Daily   Chlorhexidine Gluconate Cloth  6 each Topical Daily   cloNIDine  0.1 mg Oral [id number]   folic acid  1 mg Oral Daily   mouth rinse  15 mL Mouth Rinse BID   multivitamin with minerals  1 tablet Oral Daily   nicotine  21 mg Transdermal Daily   nirmatrelvir/ritonavir EUA  3 tablet Oral BID   thiamine  100 mg Oral Daily   Or   thiamine  100 mg Intravenous Daily   Continuous Infusions:  dextrose 5 % and 0.45% NaCl 125 mL/hr at 06/23/21 1100    Principal Problem:   Alcohol withdrawal delirium (HCC) Active Problems:   Substance abuse ([place])   Cocaine abuse ([place])   Toxic encephalopathy   Alcoholic cirrhosis of liver without ascites ([place])   COVID-19 virus infection  Tobacco abuse   CKD stage II with proteinuria   Normochromic normocytic anemia   Homelessness   LOS: 0 days   How to contact the [facility] Attending or Consulting provider [place] or covering provider during after hours [place], for this patient?  Check the care team in [facility] and look for a) attending/consulting TRH provider listed and b) the [facility] team listed Log into [email or web address] and use [place]'s universal password to access. If you do not have the password, please contact the hospital operator. Locate the [facility] provider you are looking for under [facility] and page to a number that you can be directly reached. If you still have difficulty reaching the provider, please page the [facility] (Director on Call) for the Hospitalists listed on amion for assistance.

## 2021-06-23 NOTE — Progress Notes (Signed)
Hypoglycemic Event  CBG: 50  Treatment: 8 oz juice/soda  Symptoms: None  Follow-up CBG: [id number]  CBG Result:122  Possible Reasons for Event: Inadequate meal intake  Comments/MD notified:[place] messaged    Luda Charbonneau Donney Dice

## 2021-06-23 NOTE — Hospital Course (Addendum)
55 year old woman PMH including alcohol use, severe DTs in the past requiring intubation and Precedex, crack cocaine use, cirrhosis [place] for [place] pain, confusion "out of her mind".  Last drink was that morning.  Recent crack cocaine use.  Admitted for acute alcohol withdrawal delirium, suspected drug-induced psychosis.

## 2021-06-23 NOTE — Assessment & Plan Note (Signed)
--  LFTs stable --follow-up as an outpatient --stop alcohol

## 2021-06-23 NOTE — Progress Notes (Signed)
Hypoglycemic Event  CBG: 57  Treatment: D50 25 mL (12.5 gm)  Symptoms: Sweaty  Follow-up CBG: [id number] CBG Result:157  Possible Reasons for Event: Inadequate meal intake  Comments/MD notified:[facility] paged, [place] NP    [facility] Karl Luke

## 2021-06-23 NOTE — Assessment & Plan Note (Signed)
--  TOC consultation

## 2021-06-23 NOTE — Assessment & Plan Note (Addendum)
--   Suspect played a role in toxic encephalopathy.  Recommend cessation.  TOC consultation.

## 2021-06-23 NOTE — Plan of Care (Signed)
Pt currently sleeping comfortably in bed, vital signs stable, last CIWA score 2

## 2021-06-23 NOTE — Assessment & Plan Note (Signed)
--  follow-up as an outpatient

## 2021-06-23 NOTE — Consult Note (Signed)
[place] Nurse Consult Note: Reason for Consult: Consulted for Unstageable wound to sacrum. Patient is well known to our service and was last seen in May of this year.  The area in question is a longstanding, nonhealing full thickness surgical wound with epibole (rolled, closed wound edges). 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 The silver hydrofiber is to be changed daily, the foam may be reused for up to 3 days. Change PRN for dressing dislodgement. Turning and repositioning is in place.  Floatation of the bilateral heels is recommended. A silicone foam for pressure injury prevention is to be placed over the sacrum per house protocol.  [place] Nurse ostomy consult note Stoma type/location: LLQ end colostomy Stomal assessment/size: Not measured today, but last measurement was 1 and 3/8 inches round. Peristomal assessment: Not observed today. Pouch intact. Treatment options for stomal/peristomal skin: Skin barrier rings have been used in the past and are ordered today Output: brown stool Ostomy pouching: 1pc. Flat, flexible pouch is [place] # [phone].  Skin barrier rings are Kellie Simmering # [phone]. Education provided: None. Enrolled patient in [place] program: No.  Patient has been instructed several times over the past few years that she could contact the [employer].  for assistance in obtaining supplies through their indigent program.  The patient must  contact the company themselves.  The number is [phone]- [phone] option #6. She will need to be prepared to provide her current financial information and demographics with the [employer] program coordinator at the time of the call.  [place] nursing team will not follow, but will remain available to this patient, the nursing and medical teams.  Please re-consult if needed. Thanks, Maudie Flakes, MSN, RN, [place], Arther Abbott  Pager# [phone]

## 2021-06-23 NOTE — Assessment & Plan Note (Addendum)
--   Remains asymptomatic, no hypoxia.  Chest x-ray no acute disease.  CRP within normal limits.  D-dimer modestly elevated, trending down, secondary to COVID.  Continue Paxlovid.

## 2021-06-23 NOTE — Assessment & Plan Note (Addendum)
--   Requiring higher doses of Ativan, hallucinating, confused, impulsive.  However at the present moment resting comfortably in bed with normal heart rate, follows commands.  While she may ultimately need Precedex, there is no indication for presently.  Briefly discussed informally with critical care.  Continue present management with Ativan.

## 2021-06-24 ENCOUNTER — Other Ambulatory Visit: Payer: Self-pay

## 2021-06-24 DIAGNOSIS — F1910 Other psychoactive substance abuse, uncomplicated: Secondary | ICD-10-CM

## 2021-06-24 DIAGNOSIS — F1410 Cocaine abuse, uncomplicated: Secondary | ICD-10-CM | POA: Diagnosis not present

## 2021-06-24 DIAGNOSIS — U071 COVID-19: Secondary | ICD-10-CM | POA: Diagnosis not present

## 2021-06-24 DIAGNOSIS — F10231 Alcohol dependence with withdrawal delirium: Secondary | ICD-10-CM | POA: Diagnosis not present

## 2021-06-24 DIAGNOSIS — G929 Unspecified toxic encephalopathy: Secondary | ICD-10-CM | POA: Diagnosis not present

## 2021-06-24 LAB — GLUCOSE, CAPILLARY
Glucose-Capillary: 76 mg/dL (ref 70–99)
Glucose-Capillary: 73 mg/dL (ref 70–99)
Glucose-Capillary: 107 mg/dL — ABNORMAL HIGH (ref 70–99)
Glucose-Capillary: 162 mg/dL — ABNORMAL HIGH (ref 70–99)
Glucose-Capillary: 81 mg/dL (ref 70–99)

## 2021-06-24 LAB — COMPREHENSIVE METABOLIC PANEL
ALT: 20 U/L (ref 0–44)
AST: 26 U/L (ref 15–41)
Albumin: 2.7 g/dL — ABNORMAL LOW (ref 3.5–5.0)
Alkaline Phosphatase: 63 U/L (ref 38–126)
Anion gap: 9 (ref 5–15)
BUN: 14 mg/dL (ref 6–20)
CO2: 21 mmol/L — ABNORMAL LOW (ref 22–32)
Calcium: 8.7 mg/dL — ABNORMAL LOW (ref 8.9–10.3)
Chloride: 106 mmol/L (ref 98–111)
Creatinine, Ser: 1.1 mg/dL — ABNORMAL HIGH (ref 0.44–1.00)
GFR, Estimated: >60 mL/min (ref >60)
Glucose, Bld: 73 mg/dL (ref 70–99)
Potassium: 3.1 mmol/L — ABNORMAL LOW (ref 3.5–5.1)
Sodium: 136 mmol/L (ref 135–145)
Total Bilirubin: 0.5 mg/dL (ref 0.3–1.2)
Total Protein: 6.3 g/dL — ABNORMAL LOW (ref 6.5–8.1)

## 2021-06-24 LAB — CBC
HCT: 36.8 % (ref 36.0–46.0)
Hemoglobin: 11.8 g/dL — ABNORMAL LOW (ref 12.0–15.0)
MCH: 28.6 pg (ref 26.0–34.0)
MCHC: 32.1 g/dL (ref 30.0–36.0)
MCV: 89.1 fL (ref 80.0–100.0)
Platelets: 154 10*3/uL (ref 150–400)
RBC: 4.13 MIL/uL (ref 3.87–5.11)
RDW: 14.6 % (ref 11.5–15.5)
WBC: 3.7 10*3/uL — ABNORMAL LOW (ref 4.0–10.5)
nRBC: 0 % (ref 0.0–0.2)

## 2021-06-24 LAB — MAGNESIUM: Magnesium: 1.7 mg/dL (ref 1.7–2.4)

## 2021-06-24 LAB — PHOSPHORUS: Phosphorus: 2.6 mg/dL (ref 2.5–4.6)

## 2021-06-24 LAB — D-DIMER, QUANTITATIVE: D-Dimer, Quant: 0.55 ug/mL-FEU — ABNORMAL HIGH (ref 0.00–0.50)

## 2021-06-24 LAB — FERRITIN: Ferritin: 38 ng/mL (ref 11–307)

## 2021-06-24 LAB — LIPASE, BLOOD: Lipase: 29 U/L (ref 11–51)

## 2021-06-24 MED ORDER — CITALOPRAM HYDROBROMIDE 10 MG PO TABS
10.0000 mg | ORAL_TABLET | Freq: Every day | ORAL | Status: DC
  Administered 2021-06-24 – 2021-06-30 (×7): 10 mg via ORAL
  Filled 2021-06-24 (×7): qty 1

## 2021-06-24 MED ORDER — BOOST / RESOURCE BREEZE PO LIQD CUSTOM
1.0000 | Freq: Two times a day (BID) | ORAL | Status: DC
  Administered 2021-06-24 – 2021-06-30 (×11): 1 via ORAL

## 2021-06-24 MED ORDER — ASCORBIC ACID 500 MG PO TABS
250.0000 mg | ORAL_TABLET | Freq: Two times a day (BID) | ORAL | Status: DC
  Administered 2021-06-24 – 2021-06-30 (×12): 250 mg via ORAL
  Filled 2021-06-24 (×12): qty 1

## 2021-06-24 MED ORDER — PANTOPRAZOLE SODIUM 40 MG PO TBEC
40.0000 mg | DELAYED_RELEASE_TABLET | Freq: Every day | ORAL | Status: DC
  Administered 2021-06-24 – 2021-06-30 (×7): 40 mg via ORAL
  Filled 2021-06-24 (×7): qty 1

## 2021-06-24 MED ORDER — POTASSIUM CHLORIDE CRYS ER 20 MEQ PO TBCR
40.0000 meq | EXTENDED_RELEASE_TABLET | ORAL | Status: AC
Start: 2021-06-24 — End: 2021-06-24
  Administered 2021-06-24 (×2): 40 meq via ORAL
  Filled 2021-06-24 (×2): qty 2

## 2021-06-24 MED ORDER — HYDRALAZINE HCL 10 MG PO TABS
10.0000 mg | ORAL_TABLET | Freq: Four times a day (QID) | ORAL | Status: DC | PRN
  Administered 2021-06-25 – 2021-06-27 (×2): 10 mg via ORAL
  Filled 2021-06-24 (×3): qty 1

## 2021-06-24 MED ORDER — JUVEN PO PACK
1.0000 | PACK | Freq: Two times a day (BID) | ORAL | Status: DC
  Administered 2021-06-24 – 2021-06-29 (×9): 1 via ORAL
  Filled 2021-06-24 (×9): qty 1

## 2021-06-24 MED ORDER — DEXMEDETOMIDINE HCL IN NACL 200 MCG/50ML IV SOLN
0.4000 ug/kg/h | INTRAVENOUS | Status: DC
  Administered 2021-06-24: 0.4 ug/kg/h via INTRAVENOUS
  Filled 2021-06-24: qty 50

## 2021-06-24 MED ORDER — MAGNESIUM SULFATE 2 GM/50ML IV SOLN
2.0000 g | Freq: Once | INTRAVENOUS | Status: AC
  Administered 2021-06-24: 2 g via INTRAVENOUS
  Filled 2021-06-24: qty 50

## 2021-06-24 MED ORDER — ZINC SULFATE 220 (50 ZN) MG PO CAPS
220.0000 mg | ORAL_CAPSULE | Freq: Every day | ORAL | Status: DC
  Administered 2021-06-24 – 2021-06-30 (×7): 220 mg via ORAL
  Filled 2021-06-24 (×7): qty 1

## 2021-06-24 MED ORDER — PROSOURCE PLUS PO LIQD
30.0000 mL | Freq: Two times a day (BID) | ORAL | Status: DC
  Administered 2021-06-24 – 2021-06-30 (×11): 30 mL via ORAL
  Filled 2021-06-24 (×12): qty 30

## 2021-06-24 NOTE — Progress Notes (Signed)
Patient appears to be starting the withdrawal process. CIWA's have become increasingly higher, higher amount of ativan is no longer having the same effect as before. Patient is becoming agitated at times and yelling out. Last CIWA 17, treated with '3mg'$  IV ativan per order. Will began assessing a little more frequently for adequate control of symptoms.

## 2021-06-24 NOTE — Plan of Care (Signed)
  Problem: Education: Goal: Knowledge of risk factors and measures for prevention of condition will improve Outcome: Progressing   Problem: Coping: Goal: Psychosocial and spiritual needs will be supported Outcome: Progressing   Problem: Respiratory: Goal: Will maintain a patent airway Outcome: Progressing Goal: Complications related to the disease process, condition or treatment will be avoided or minimized Outcome: Progressing   Problem: Education: Goal: Knowledge of disease or condition will improve Outcome: Progressing Goal: Understanding of discharge needs will improve Outcome: Progressing   Problem: Health Behavior/Discharge Planning: Goal: Ability to identify changes in lifestyle to reduce recurrence of condition will improve Outcome: Progressing Goal: Identification of resources available to assist in meeting health care needs will improve Outcome: Progressing   Problem: Physical Regulation: Goal: Complications related to the disease process, condition or treatment will be avoided or minimized Outcome: Progressing   Problem: Safety: Goal: Ability to remain free from injury will improve Outcome: Progressing   Problem: Education: Goal: Knowledge of General Education information will improve Description: Including pain rating scale, medication(s)/side effects and non-pharmacologic comfort measures Outcome: Progressing   Problem: Health Behavior/Discharge Planning: Goal: Ability to manage health-related needs will improve Outcome: Progressing   Problem: Clinical Measurements: Goal: Ability to maintain clinical measurements within normal limits will improve Outcome: Progressing Goal: Will remain free from infection Outcome: Progressing Goal: Diagnostic test results will improve Outcome: Progressing Goal: Respiratory complications will improve Outcome: Progressing Goal: Cardiovascular complication will be avoided Outcome: Progressing   Problem: Activity: Goal:  Risk for activity intolerance will decrease Outcome: Progressing   Problem: Nutrition: Goal: Adequate nutrition will be maintained Outcome: Progressing   Problem: Coping: Goal: Level of anxiety will decrease Outcome: Progressing   Problem: Elimination: Goal: Will not experience complications related to bowel motility Outcome: Progressing Goal: Will not experience complications related to urinary retention Outcome: Progressing   Problem: Pain Managment: Goal: General experience of comfort will improve Outcome: Progressing   Problem: Safety: Goal: Ability to remain free from injury will improve Outcome: Progressing   Problem: Skin Integrity: Goal: Risk for impaired skin integrity will decrease Outcome: Progressing

## 2021-06-24 NOTE — Progress Notes (Signed)
She for PROGRESS NOTE  Sherry Baker [id number] DOB: 04-17-1966 DOA: 06/22/2021 PCP: Default, Provider, MD  Brief History   55 year old woman PMH including alcohol use, crack cocaine use, cirrhosis [place] for [place] pain, confusion "out of her mind".  Last drink was that morning.  Recent crack cocaine use.  Admitted for acute alcohol withdrawal delirium, suspected drug-induced psychosis.  A & P  * Alcohol withdrawal delirium (HCC) -- Worsened overnight requiring high doses of Ativan.  Continue close monitoring.  History of DTs in the past requiring intubation.  At high risk for decompensation.  Toxic encephalopathy -- Probably multifactorial including crack cocaine, alcohol, withdrawal from alcohol --supportive care  Cocaine abuse ([place]) -- Suspect playing a role in toxic encephalopathy.  Recommend cessation.  TOC consultation.  Substance abuse ([place]) --TOC consultation  COVID-19 virus infection -- Asymptomatic, no hypoxia.  Chest x-ray no acute disease.  CRP within normal limits.  D-dimer modestly elevated, now trending down, secondary to COVID.  Continue Paxlovid.  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 [name] assessment 06/06/19, this is an old surgical wound that did not heal based on location  (Active)  06/06/19 0500  Location: Sacrum  Location Orientation: Posterior  Staging: Stage IV - Full thickness tissue loss with exposed bone, tendon or muscle.  Wound Description (Comments): WOC assessment 06/06/19, this is an old surgical wound that did not heal based on  location   Present on Admission: Yes     Pressure Injury Perineum Deep Tissue Injury - Purple or maroon localized area of discolored intact skin or blood-filled blister due to damage of underlying soft tissue from pressure and/or shear. Bordering stage IV pressure injury (Active)     Location: Perineum  Location Orientation:   Staging: Deep Tissue Injury - Purple or maroon localized area of discolored intact skin or blood-filled blister due to damage of underlying soft tissue from pressure and/or shear.  Wound Description (Comments): Bordering stage IV pressure injury  Present on Admission: Yes     Pressure Injury 03/29/21 Coccyx Mid;Upper Unstageable - Full thickness tissue loss in which the base of the injury is covered by slough (yellow, tan, gray, green or brown) and/or eschar (tan, brown or black) in the wound bed. previously healed wound (Active)  03/29/21 1915  Location: Coccyx  Location Orientation: Mid;Upper  Staging: Unstageable - Full thickness tissue loss in which the base of the injury is covered by slough (yellow, tan, gray, green or brown) and/or eschar (tan, brown or black) in the wound bed.  Wound Description (Comments): previously healed wound  Present on Admission: Yes     Pressure Injury 06/23/21 Coccyx Lower;Medial Unstageable - Full thickness tissue loss in which the base of the injury is covered by slough (yellow, tan, gray, green or brown) and/or eschar (tan, brown or black) in the wound bed. (Active)  06/23/21 0100  Location: Coccyx  Location Orientation: Lower;Medial  Staging: Unstageable - Full thickness tissue loss in which the base of the injury is covered by slough (yellow, tan, gray, green or brown) and/or eschar (tan, brown or black) in the wound bed.  Wound  Description (Comments):   Present on Admission: Yes   Disposition Plan:  Discussion:   Status is: Inpatient  Remains inpatient appropriate because:IV treatments appropriate due to intensity of  illness or inability to take PO and Inpatient level of care appropriate due to severity of illness  Dispo: The patient is from: Home              Anticipated d/c is to: Home              Patient currently is not medically stable to d/c.   Difficult to place patient No  DVT prophylaxis: SCDs Start: 06/23/21 0152   Code Status: Full Code Level of care: Stepdown Family Communication: none  Murray Hodgkins, MD  [facility] Direct contact: see [email or web address] (further directions at bottom of note if needed) 7PM-7AM contact night coverage as at bottom of note 06/24/2021, 10:36 AM  LOS: 1 day   Significant Hospital Events   8/20 admit for alcohol withdrawal   Consults:  None    Procedures:  None   Significant Diagnostic Tests:  8/21 CT head NAD 8/21 CXR NAD   Micro Data:  COVID+ MRSA-   Antimicrobials:  None  Interval History/Subjective  CC: f/u alcohol withdrawal  Per nursing note, more confused overnight requiring high-dose Ativan.  With frequent monitoring needed.  Awake and alert but confused. Denies hallucinations, endorses tremors.  Objective   Vitals:  Vitals:   06/24/21 1000 06/24/21 1022  BP:  (!) 169/94  Pulse: (!) 110 94  Resp: (!) 22 20  Temp:    SpO2: 90% 93%    Exam: Physical Exam Vitals reviewed.  Constitutional:      General: She is not in acute distress.    Appearance: She is ill-appearing. She is not toxic-appearing.  Cardiovascular:     Rate and Rhythm: Regular rhythm. Bradycardia present.     Heart sounds: No murmur heard.    Comments: Telemetry SB in 2s Pulmonary:     Effort: Pulmonary effort is normal. No respiratory distress.     Breath sounds: Normal breath sounds. No wheezing, rhonchi or rales.  Abdominal:     General: Abdomen is flat. There is no distension.     Tenderness: There is no abdominal tenderness. There is no guarding.  Musculoskeletal:     Right lower leg: No edema.     Left lower leg: No edema.  Neurological:      Mental Status: She is alert.     Comments: Bilateral hand tremors noted  Psychiatric:     Comments: Confused, oriented to self only   I have personally reviewed the labs and other data, making special note of:   Today's Data  CBG low normal, potassium 3.1 LFTs unremarkable CBC stable Ddimer down to 0.55  Scheduled Meds:  Chlorhexidine Gluconate Cloth  6 each Topical Daily   citalopram  10 mg Oral Daily   folic acid  1 mg Oral Daily   mouth rinse  15 mL Mouth Rinse BID   multivitamin with minerals  1 tablet Oral Daily   nicotine  21 mg Transdermal Daily   nirmatrelvir/ritonavir EUA  3 tablet Oral BID   pantoprazole  40 mg Oral Daily   potassium chloride  40 mEq Oral Q4H   thiamine  100 mg Oral Daily   Or   thiamine  100 mg Intravenous Daily   Continuous Infusions:  dextrose 5 % and 0.45% NaCl Stopped (06/23/21 2048)   magnesium sulfate bolus IVPB 2  g (06/24/21 0948)    Principal Problem:   Alcohol withdrawal delirium (HCC) Active Problems:   Substance abuse ([place])   Cocaine abuse ([place])   Toxic encephalopathy   Alcoholic cirrhosis of liver without ascites ([place])   COVID-19 virus infection   Tobacco abuse   CKD stage II with proteinuria   Normochromic normocytic anemia   Homelessness   LOS: 1 day   How to contact the [facility] Attending or Consulting provider [place] or covering provider during after hours [place], for this patient?  Check the care team in [facility] and look for a) attending/consulting TRH provider listed and b) the [facility] team listed Log into [email or web address] and use [place]'s universal password to access. If you do not have the password, please contact the hospital operator. Locate the [facility] provider you are looking for under [facility] and page to a number that you can be directly reached. If you still have difficulty reaching the provider, please page the [facility] (Director on Call) for the Hospitalists listed on amion for assistance.

## 2021-06-24 NOTE — Progress Notes (Signed)
Initial Nutrition Assessment  DOCUMENTATION CODES:   Severe malnutrition in context of social or environmental circumstances, Underweight  INTERVENTION:  - will order Boost Breeze BID, each supplement provides 250 kcal and 9 grams of protein. - will order 1 packet Juven BID, each packet provides 95 calories, 2.5 grams of protein (collagen), and 9.8 grams of carbohydrate (3 grams sugar); also contains 7 grams of L-arginine and L-glutamine, 300 mg vitamin C, 15 mg vitamin E, 1.2 mcg vitamin B-12, 9.5 mg zinc, 200 mg calcium, and 1.5 g  Calcium Beta-hydroxy-Beta-methylbutyrate to support wound healing. - will order 30 ml Prosource Plus BID, each supplement provides 100 kcal and 15 grams protein.  - will order 250 mg ascorbic acid BID and 220 mg zinc sulfate once/day to aid in wound healing - diet advancement as medically feasible.    NUTRITION DIAGNOSIS:   Severe Malnutrition related to social / environmental circumstances (ongoing alcohol abuse) as evidenced by severe fat depletion, severe muscle depletion.  GOAL:   Patient will meet greater than or equal to 90% of their needs  MONITOR:   PO intake, Supplement acceptance, Diet advancement, Labs, Weight trends, Skin  REASON FOR ASSESSMENT:   Consult Assessment of nutrition requirement/status  ASSESSMENT:   55 year old woman with medical history of alcohol use, crack cocaine use, cirrhosis, HTN, arthritis, bipolar disorder, and rectal cancer. She [place] due to bilateral leg pain and confusion. Her last drink was 8/20 AM. She was admitted for acute alcohol withdrawal delirium and suspected drug-induced psychosis.  Patient discussed in rounds this AM. Patient noted to be a/o x4 with poor attention/concentration and memory impairment. She has been following commands.   Regular diet ordered on 8/20 at 2147, downgraded to NPO yesterday at 0150, and advanced to CLD yesterday at 1442. The only documented meal  intake was 0% of breakfast this AM.   Will order ONS at outlined above and adjust as needed with diet advancement.  Patient was previously seen by RDs during hospitalization at the end of May through beginning of June. During that time she met criteria for severe malnutrition and she required Cortrak placement.   Weight yesterday was 99 lb and weight on 6/6 was 109 lb. This indicates 10 lb weight loss (9.2% body weight) in the past 2.5 months; significant for time frame.    Labs reviewed; CBGs: 76, 73, 107 mg/dl, K: 3.1 mmol/l, creatinine: 1.1 mg/dl, Ca: 8.7 mg/dl. Medications reviewed; 250 mg ascorbic acid BID, 1 mg folvite/day, 2 g IV Mg sulfate x1 run 8/22, 1 tablet multivitamin with minerals/day, 40 mg oral protonix/day, 100 mg oral thiamine/day. IVF; D5-1/2 NS @ 100 ml/hr (408 kcal/24 hrs).     NUTRITION - FOCUSED PHYSICAL EXAM:  Flowsheet Row Most Recent Value  Orbital Region Severe depletion  Upper Arm Region Severe depletion  Thoracic and Lumbar Region Unable to assess  Buccal Region Severe depletion  Temple Region Moderate depletion  Clavicle Bone Region Severe depletion  Clavicle and Acromion Bone Region Severe depletion  Scapular Bone Region Unable to assess  Dorsal Hand Moderate depletion  Patellar Region Unable to assess  Anterior Thigh Region Unable to assess  Posterior Calf Region Unable to assess  Edema (RD Assessment) Unable to assess  Hair Reviewed  Eyes Reviewed  Mouth Reviewed  Skin Reviewed  Nails Reviewed       Diet Order:   Diet Order             Diet clear  liquid Room service appropriate? Yes; Fluid consistency: Thin  Diet effective now                   EDUCATION NEEDS:   No education needs have been identified at this time  Skin:  Skin Assessment: Skin Integrity Issues: Skin Integrity Issues:: Unstageable Unstageable: full thickness to coccyx  Last BM:  8/21 (type 5)  Height:   Ht Readings from Last 1 Encounters:  06/23/21 5'  2" (1.575 m)    Weight:   Wt Readings from Last 1 Encounters:  06/23/21 44.9 kg      Estimated Nutritional Needs:  Kcal:  2100-2300 kcal Protein:  105-120 grams Fluid:  >/= 2.3 L/day      Jarome Matin, MS, RD, LDN, CNSC Inpatient Clinical Dietitian RD pager # available in [place]  After hours/weekend pager # available in [facility]

## 2021-06-25 DIAGNOSIS — E43 Unspecified severe protein-calorie malnutrition: Secondary | ICD-10-CM

## 2021-06-25 DIAGNOSIS — F10231 Alcohol dependence with withdrawal delirium: Principal | ICD-10-CM

## 2021-06-25 DIAGNOSIS — U071 COVID-19: Secondary | ICD-10-CM | POA: Diagnosis not present

## 2021-06-25 DIAGNOSIS — G9340 Encephalopathy, unspecified: Secondary | ICD-10-CM | POA: Diagnosis not present

## 2021-06-25 DIAGNOSIS — N182 Chronic kidney disease, stage 2 (mild): Secondary | ICD-10-CM | POA: Diagnosis not present

## 2021-06-25 DIAGNOSIS — R636 Underweight: Secondary | ICD-10-CM

## 2021-06-25 LAB — GLUCOSE, CAPILLARY
Glucose-Capillary: 115 mg/dL — ABNORMAL HIGH (ref 70–99)
Glucose-Capillary: 115 mg/dL — ABNORMAL HIGH (ref 70–99)
Glucose-Capillary: 121 mg/dL — ABNORMAL HIGH (ref 70–99)
Glucose-Capillary: 134 mg/dL — ABNORMAL HIGH (ref 70–99)
Glucose-Capillary: 139 mg/dL — ABNORMAL HIGH (ref 70–99)
Glucose-Capillary: 139 mg/dL — ABNORMAL HIGH (ref 70–99)
Glucose-Capillary: 155 mg/dL — ABNORMAL HIGH (ref 70–99)
Glucose-Capillary: 67 mg/dL — ABNORMAL LOW (ref 70–99)

## 2021-06-25 LAB — PHOSPHORUS: Phosphorus: 2.2 mg/dL — ABNORMAL LOW (ref 2.5–4.6)

## 2021-06-25 LAB — BASIC METABOLIC PANEL
Anion gap: 5 (ref 5–15)
BUN: 9 mg/dL (ref 6–20)
CO2: 24 mmol/L (ref 22–32)
Calcium: 9 mg/dL (ref 8.9–10.3)
Chloride: 106 mmol/L (ref 98–111)
Creatinine, Ser: 0.94 mg/dL (ref 0.44–1.00)
GFR, Estimated: >60 mL/min (ref >60)
Glucose, Bld: 88 mg/dL (ref 70–99)
Potassium: 4.1 mmol/L (ref 3.5–5.1)
Sodium: 135 mmol/L (ref 135–145)

## 2021-06-25 LAB — FERRITIN: Ferritin: 38 ng/mL (ref 11–307)

## 2021-06-25 LAB — D-DIMER, QUANTITATIVE: D-Dimer, Quant: 0.42 ug/mL-FEU (ref 0.00–0.50)

## 2021-06-25 LAB — MAGNESIUM: Magnesium: 2.1 mg/dL (ref 1.7–2.4)

## 2021-06-25 MED ORDER — ONDANSETRON 4 MG PO TBDP
4.0000 mg | ORAL_TABLET | Freq: Four times a day (QID) | ORAL | Status: AC | PRN
  Administered 2021-06-26: 4 mg via ORAL
  Filled 2021-06-25: qty 1

## 2021-06-25 MED ORDER — POTASSIUM PHOSPHATES 15 MMOLE/5ML IV SOLN
20.0000 mmol | Freq: Once | INTRAVENOUS | Status: AC
  Administered 2021-06-25: 20 mmol via INTRAVENOUS
  Filled 2021-06-25: qty 6.67

## 2021-06-25 MED ORDER — CLONIDINE HCL 0.1 MG PO TABS
0.1000 mg | ORAL_TABLET | Freq: Every day | ORAL | Status: DC
  Administered 2021-06-30: 0.1 mg via ORAL
  Filled 2021-06-25: qty 1

## 2021-06-25 MED ORDER — LOPERAMIDE HCL 2 MG PO CAPS: 2.0000 mg | ORAL_CAPSULE | ORAL | Status: AC | PRN

## 2021-06-25 MED ORDER — CLONIDINE HCL 0.1 MG PO TABS
0.1000 mg | ORAL_TABLET | ORAL | Status: AC
Start: 2021-06-27 — End: 2021-06-29
  Administered 2021-06-27 – 2021-06-29 (×4): 0.1 mg via ORAL
  Filled 2021-06-25 (×4): qty 1

## 2021-06-25 MED ORDER — DEXTROSE 50 % IV SOLN
12.5000 g | INTRAVENOUS | Status: AC
  Administered 2021-06-25: 12.5 g via INTRAVENOUS

## 2021-06-25 MED ORDER — IBUPROFEN 200 MG PO TABS: 400.0000 mg | ORAL_TABLET | Freq: Four times a day (QID) | ORAL | Status: DC | PRN

## 2021-06-25 MED ORDER — DEXMEDETOMIDINE HCL IN NACL 200 MCG/50ML IV SOLN
0.4000 ug/kg/h | INTRAVENOUS | Status: DC
  Administered 2021-06-25: 0.8 ug/kg/h via INTRAVENOUS
  Administered 2021-06-25: 0.4 ug/kg/h via INTRAVENOUS
  Administered 2021-06-26: 0.2 ug/kg/h via INTRAVENOUS
  Filled 2021-06-25 (×5): qty 50

## 2021-06-25 MED ORDER — GABAPENTIN 100 MG PO CAPS
100.0000 mg | ORAL_CAPSULE | Freq: Three times a day (TID) | ORAL | Status: DC
Start: 2021-06-25 — End: 2021-06-30
  Administered 2021-06-25 – 2021-06-30 (×15): 100 mg via ORAL
  Filled 2021-06-25 (×15): qty 1

## 2021-06-25 MED ORDER — DEXTROSE 50 % IV SOLN
INTRAVENOUS | Status: AC
  Filled 2021-06-25: qty 50

## 2021-06-25 MED ORDER — NAPROXEN 250 MG PO TABS
500.0000 mg | ORAL_TABLET | Freq: Two times a day (BID) | ORAL | Status: DC | PRN
  Administered 2021-06-27: 500 mg via ORAL
  Filled 2021-06-25 (×3): qty 2

## 2021-06-25 MED ORDER — BUSPIRONE HCL 5 MG PO TABS
15.0000 mg | ORAL_TABLET | Freq: Two times a day (BID) | ORAL | Status: DC
  Administered 2021-06-25 – 2021-06-28 (×8): 15 mg via ORAL
  Filled 2021-06-25 (×8): qty 1

## 2021-06-25 MED ORDER — METHOCARBAMOL 500 MG PO TABS: 500.0000 mg | ORAL_TABLET | Freq: Three times a day (TID) | ORAL | Status: AC | PRN

## 2021-06-25 MED ORDER — DICYCLOMINE HCL 20 MG PO TABS
20.0000 mg | ORAL_TABLET | Freq: Four times a day (QID) | ORAL | Status: AC | PRN
  Filled 2021-06-25: qty 1

## 2021-06-25 MED ORDER — CLONIDINE HCL 0.1 MG PO TABS
0.1000 mg | ORAL_TABLET | Freq: Four times a day (QID) | ORAL | Status: AC
  Administered 2021-06-26 – 2021-06-27 (×7): 0.1 mg via ORAL
  Filled 2021-06-25 (×9): qty 1

## 2021-06-25 MED ORDER — HYDROXYZINE HCL 25 MG PO TABS: 25.0000 mg | ORAL_TABLET | Freq: Four times a day (QID) | ORAL | Status: AC | PRN

## 2021-06-25 NOTE — Progress Notes (Addendum)
She for PROGRESS NOTE  Sherry Baker [id number] DOB: 03-04-1966 DOA: 06/22/2021 PCP: Default, Provider, MD  Brief History   55 year old woman PMH including alcohol use, severe DTs in the past requiring intubation and Precedex, crack cocaine use, cirrhosis [place] station for bilateral leg pain, confusion "out of her mind".  Last drink was that morning.  Recent crack cocaine use.  Admitted for acute alcohol withdrawal delirium, suspected drug-induced psychosis.  ADDENDUM 1300 Patient climbed over bed rail, fell on knees, no apparent injury. HT 90s, BP stable, d/w PCCM, they will see but per their request I will initiate Precedex. A & P  * Delirium tremens ([place]) -- Requiring higher doses of Ativan, hallucinating, confused, impulsive.  However at the present moment resting comfortably in bed with normal heart rate, follows commands.  While she may ultimately need Precedex, there is no indication for presently.  Briefly discussed informally with critical care.  Continue present management with Ativan.  Toxic encephalopathy -- Probably multifactorial including crack cocaine, alcohol, withdrawal from alcohol --supportive care  Cocaine abuse ([place]) -- Suspect played a role in toxic encephalopathy.  Recommend cessation.  TOC consultation.  Substance abuse ([place]) --TOC consultation  COVID-19 virus infection -- Remains asymptomatic, no hypoxia.  Chest x-ray no acute disease.  CRP within normal limits.  D-dimer modestly elevated, trending down, secondary to COVID.  Continue Paxlovid.  Alcoholic cirrhosis of liver without ascites (HCC) --LFTs stable --follow-up as an outpatient --stop alcohol  Underweight --as per malnutirition  CKD stage II with proteinuria --follow-up as an outpatient  Severe malnutrition (HCC) -- Boost Breeze BID --Juven BID --Prosource Plus BID --ascorbic acid BID and 220 mg zinc sulfate aid in wound healing  Skin Assessment: I agree with the wound  assessment as performed by the wound care RN as outlined below:   Pressure Injury 06/23/21 Coccyx Lower;Medial Unstageable - Full thickness tissue loss in which the base of the injury is covered by slough (yellow, tan, gray, green or brown) and/or eschar (tan, brown or black) in the wound bed. (Active)  06/23/21 0100  Location: Coccyx  Location Orientation: Lower;Medial  Staging: Unstageable - Full thickness tissue loss in which the base of the injury is covered by slough (yellow, tan, gray, green or brown) and/or eschar (tan, brown or black) in the wound bed.  Wound Description (Comments):   Present on Admission: Yes   Disposition Plan:  Discussion:   Status is: Inpatient  Remains inpatient appropriate because:IV treatments appropriate due to intensity of illness or inability to take PO and Inpatient level of care appropriate due to severity of illness  Dispo: The patient is from: Home              Anticipated d/c is to: Home              Patient currently is not medically stable to d/c.   Difficult to place patient No  DVT prophylaxis: SCDs Start: 06/23/21 0152   Code Status: Full Code Level of care: Stepdown Family Communication: none  Murray Hodgkins, MD  [facility] Direct contact: see [email or web address] (further directions at bottom of note if needed) 7PM-7AM contact night coverage as at bottom of note 06/25/2021, 11:14 AM  LOS: 2 days   Significant Hospital Events   8/20 admit for alcohol withdrawal   Consults:  None    Procedures:  None   Significant Diagnostic Tests:  8/21 CT head NAD 8/21 CXR NAD   Micro Data:  COVID+ MRSA-  Antimicrobials:  None  Interval History/Subjective  CC: f/u alcohol withdrawal  Discussed with RN.  More impulsive and confused, ripped out IV last night, jumped out of bed, requiring more Ativan.  Beer can found in patient's belongings, confiscated.  Patient reports hallucinations and tremors.  Breathing okay.  Chronic pain from  fall several months ago.  Objective   Vitals:  Vitals:   06/25/21 0600 06/25/21 0700  BP: 132/84 (!) 189/101  Pulse: 72 72  Resp: (!) 28 17  Temp:    SpO2: 96% 96%    Exam: Physical Exam Vitals and nursing note reviewed.  Constitutional:      General: She is not in acute distress.    Appearance: She is ill-appearing. She is not toxic-appearing.  Cardiovascular:     Rate and Rhythm: Normal rate and regular rhythm.     Heart sounds: No murmur heard.    Comments: Telemetry SR Pulmonary:     Effort: Pulmonary effort is normal. No respiratory distress.     Breath sounds: Normal breath sounds. No wheezing, rhonchi or rales.  Abdominal:     General: Abdomen is flat. There is no distension.     Tenderness: There is no abdominal tenderness. There is no guarding.     Comments: Colostomy in place, soft brown stool  Musculoskeletal:     Comments: Tremors bilateral hands  Neurological:     Mental Status: She is alert.  Psychiatric:        Mood and Affect: Mood normal.        Behavior: Behavior normal.   I have personally reviewed the labs and other data, making special note of:   Today's Data  1 episode of mild hypoglycemia.  Phosphorus 2.2. Ddimer down to 0.42  Scheduled Meds:  (feeding supplement) PROSource Plus  30 mL Oral BID BM   vitamin C  250 mg Oral BID   busPIRone  15 mg Oral BID   Chlorhexidine Gluconate Cloth  6 each Topical Daily   citalopram  10 mg Oral Daily   feeding supplement  1 Container Oral BID BM   folic acid  1 mg Oral Daily   gabapentin  100 mg Oral TID   mouth rinse  15 mL Mouth Rinse BID   multivitamin with minerals  1 tablet Oral Daily   nicotine  21 mg Transdermal Daily   nirmatrelvir/ritonavir EUA  3 tablet Oral BID   nutrition supplement (JUVEN)  1 packet Oral BID BM   pantoprazole  40 mg Oral Daily   thiamine  100 mg Oral Daily   Or   thiamine  100 mg Intravenous Daily   zinc sulfate  220 mg Oral Daily   Continuous Infusions:   dextrose 5 % and 0.45% NaCl 100 mL/hr at 06/25/21 0438   potassium PHOSPHATE IVPB (in mmol) 20 mmol (06/25/21 1017)    Principal Problem:   Delirium tremens ([place]) Active Problems:   Substance abuse ([place])   Cocaine abuse ([place])   Toxic encephalopathy   Alcoholic cirrhosis of liver without ascites ([place])   COVID-19 virus infection   Severe malnutrition (HCC)   Tobacco abuse   CKD stage II with proteinuria   Normochromic normocytic anemia   Homelessness   Underweight   Protein-calorie malnutrition, severe   LOS: 2 days   How to contact the [facility] Attending or Consulting provider 7A - 7P or covering provider during after hours [place], for this patient?  Check the care team in [facility] and look  for a) attending/consulting [place] provider listed and b) the [facility] team listed Log into [email or web address] and use [place]'s universal password to access. If you do not have the password, please contact the hospital operator. Locate the [facility] provider you are looking for under [facility] and page to a number that you can be directly reached. If you still have difficulty reaching the provider, please page the [facility] (Director on Call) for the Hospitalists listed on amion for assistance.

## 2021-06-25 NOTE — Progress Notes (Signed)
Patient became very confused and was seen on the floor through video surveillance. Patient was sitting on floor beside bed on knees.This RN went into room to assist patient. Patient has no noticeable injuries and denies injury to head or any other part of body. Floor matts were in place and nonskid fall socks were on. Dr. Sarajane Jews notified, new orders for precedex. Will continue to closely monitor this patient.

## 2021-06-25 NOTE — Assessment & Plan Note (Addendum)
--   Boost Breeze BID --Juven BID --Prosource Plus BID --ascorbic acid BID and 220 mg zinc sulfate aid in wound healing

## 2021-06-25 NOTE — Consult Note (Signed)
NAME:  Sherry Baker, MRN:  [id number], DOB:  Mar 04, 1966, LOS: 2 ADMISSION DATE:  06/22/2021, CONSULTATION DATE:  06/25/21 REFERRING MD:  Dr Carlean Jews, CHIEF COMPLAINT:  Acute Agitated Encepphaloathy  History of Present Illness:  55 year old female CKD stage II, proteinuria alcoholic and smoker who is lean and cachectic and looks malnourished.  She has a previous history of intubation for DTs history of cirrhosis not otherwise specified, depression not otherwise specified brought in on 06/22/2021 due to confusion with prior intake of alcohol several hours earlier.  There is also a history of a fall.  She is found to be hypertensive in the ER.  She denies any history of bipolar.  She is unvaccinated against COVID and COVID was incidental positive.  She has been started on Palovid.  Urine tox at admission was positive for marijuana and cocaine.  Admission diagnoses acute alcohol withdrawal delirium with drug-induced psychosis associated with cocaine and marijuana toxicity.  She seems stable on 06/23/2021.  On 06/24/2021 she had worsening delirium requiring higher doses of lorazepam.  On 06/25/2021 she had further worsening try to climb out of the bed.  Critical care medicine consulted.  Order for Precedex has been given.  At the time of CCM evaluation Precedex have been started and she is somewhat improved but still requiring frequent reorientation.  At admission on 06/23/2021 she was also found to have lower medial unstageable full-thickness decub  Pertinent  Medical History    has a past medical history of Alcohol abuse, Allergy, Arthritis, Bipolar 1 disorder ([place]), Cancer ([place]) (01/21/2017), Cancer ([place]), Cirrhosis of liver ([place]), Depression, Genetic testing (03/24/2017), and Hypertension.   reports that she has been smoking cigarettes. She has a 19.50 pack-year smoking history. She has never used smokeless tobacco.  Past Surgical History:  Procedure Laterality Date   ABDOMINAL PERINEAL BOWEL  RESECTION N/A 06/18/2017   Procedure: ABDOMINAL PERINEAL RESECTION ERAS PATHWAY;  Surgeon: Leighton Ruff, MD;  Location: [facility];  Service: General;  Laterality: N/A;   COLON SURGERY     COLONOSCOPY WITH PROPOFOL Left 01/21/2017   Procedure: COLONOSCOPY WITH PROPOFOL;  Surgeon: Otis Brace, MD;  Location: [facility];  Service: Gastroenterology;  Laterality: Left;   FRACTURE SURGERY     MANDIBLE FRACTURE SURGERY      Allergies  Allergen Reactions   Penicillins Hives and Swelling    Did it involve swelling of the face/tongue/throat, SOB, or low BP? Yes Did it involve sudden or severe rash/hives, skin peeling, or any reaction on the inside of your mouth or nose? No Did you need to seek medical attention at a hospital or doctor's office? No When did it last happen?  <10 years   If all above answers are "NO", may proceed with cephalosporin use.    Penicillins Hives and Swelling    Has patient had a PCN reaction causing immediate rash, facial/tongue/throat swelling, SOB or lightheadedness with hypotension: Yes Has patient had a PCN reaction causing severe rash involving mucus membranes or skin necrosis: No Has patient had a PCN reaction that required hospitalization No Has patient had a PCN reaction occurring within the last 10 years: Yes If all of the above answers are "NO", then may proceed with Cephalosporin use.     Immunization History  Administered Date(s) Administered   Influenza,inj,Quad PF,6+ Mos 01/17/2017, 09/14/2019   Pneumococcal Polysaccharide-23 01/17/2017   Tdap 05/25/2015    Family History  Problem Relation Age of Onset   Cancer Mother 8  Originating in the abdomen, otherwise unknwon   Cancer - Other Maternal Aunt        Throat     Current Facility-Administered Medications:    (feeding supplement) PROSource Plus liquid 30 mL, 30 mL, Oral, BID BM, Samuella Cota, MD, 30 mL at 06/25/21 [id number]   acetaminophen (TYLENOL) tablet 650 mg, 650 mg, Oral, Q6H  PRN, 650 mg at 06/25/21 1020 **OR** acetaminophen (TYLENOL) suppository 650 mg, 650 mg, Rectal, Q6H PRN, Doutova, Anastassia, MD   alum & mag hydroxide-simeth (MAALOX/MYLANTA) 200-200-20 MG/5ML suspension 30 mL, 30 mL, Oral, Q6H PRN, Domenic Moras, PA-C   ascorbic acid (VITAMIN C) tablet 250 mg, 250 mg, Oral, BID, Samuella Cota, MD, 250 mg at 06/25/21 1021   busPIRone (BUSPAR) tablet 15 mg, 15 mg, Oral, BID, Samuella Cota, MD   Chlorhexidine Gluconate Cloth 2 % PADS 6 each, 6 each, Topical, Daily, Doutova, Anastassia, MD, 6 each at 06/25/21 [id number]   citalopram (CELEXA) tablet 10 mg, 10 mg, Oral, Daily, Samuella Cota, MD, 10 mg at 06/25/21 1025   dexmedetomidine (PRECEDEX) 200 MCG/50ML (4 mcg/mL) infusion, 0.4-1.2 mcg/kg/hr, Intravenous, Titrated, Samuella Cota, MD   dextrose 5 %-0.45 % sodium chloride infusion, , Intravenous, Continuous, Samuella Cota, MD, Last Rate: 100 mL/hr at 06/25/21 1236, New Bag at 06/25/21 1236   feeding supplement (BOOST / RESOURCE BREEZE) liquid 1 Container, 1 Container, Oral, BID BM, Samuella Cota, MD, 1 Container at 0000000 [id number]   folic acid (FOLVITE) tablet 1 mg, 1 mg, Oral, Daily, Domenic Moras, PA-C, 1 mg at 06/25/21 1020   gabapentin (NEURONTIN) capsule 100 mg, 100 mg, Oral, TID, Samuella Cota, MD   hydrALAZINE (APRESOLINE) tablet 10 mg, 10 mg, Oral, Q6H PRN, Samuella Cota, MD, 10 mg at 06/25/21 1026   ibuprofen (ADVIL) tablet 400 mg, 400 mg, Oral, Q6H PRN, Samuella Cota, MD   LORazepam (ATIVAN) tablet 1-4 mg, 1-4 mg, Oral, Q1H PRN, 4 mg at 06/25/21 1020 **OR** LORazepam (ATIVAN) injection 1-4 mg, 1-4 mg, Intravenous, Q1H PRN, Doutova, Anastassia, MD, 4 mg at 06/25/21 1243   MEDLINE mouth rinse, 15 mL, Mouth Rinse, BID, Doutova, Anastassia, MD, 15 mL at 06/25/21 1027   multivitamin with minerals tablet 1 tablet, 1 tablet, Oral, Daily, Domenic Moras, PA-C, 1 tablet at 06/25/21 1025   nicotine (NICODERM CQ - dosed in mg/24 hours)  patch 21 mg, 21 mg, Transdermal, Daily, Domenic Moras, PA-C, 21 mg at 06/25/21 1033   nirmatrelvir/ritonavir EUA (PAXLOVID) 3 tablet, 3 tablet, Oral, BID, Doutova, Anastassia, MD, 3 tablet at 06/25/21 1028   nutrition supplement (JUVEN) (JUVEN) powder packet 1 packet, 1 packet, Oral, BID BM, Samuella Cota, MD, 1 packet at 06/24/21 2003   ondansetron [facility]) tablet 4 mg, 4 mg, Oral, Q8H PRN, Domenic Moras, PA-C, 4 mg at 06/23/21 0122   pantoprazole (PROTONIX) EC tablet 40 mg, 40 mg, Oral, Daily, Samuella Cota, MD, 40 mg at 06/25/21 1021   potassium PHOSPHATE 20 mmol in dextrose 5 % 500 mL infusion, 20 mmol, Intravenous, Once, Samuella Cota, MD, Last Rate: 85 mL/hr at 06/25/21 1200, Infusion Verify at 06/25/21 1200   thiamine tablet 100 mg, 100 mg, Oral, Daily, 100 mg at 06/25/21 1020 **OR** thiamine (B-1) injection 100 mg, 100 mg, Intravenous, Daily, Doutova, Anastassia, MD   zinc sulfate capsule 220 mg, 220 mg, Oral, Daily, Samuella Cota, MD, 220 mg at 06/25/21 1027   zolpidem (AMBIEN) tablet 5 mg, 5 mg, Oral,  QHS PRN, Domenic Moras, PA-C   Significant Hospital Events: Including procedures, antibiotic start and stop dates in addition to other pertinent events   06/22/2021 - admit 06/25/21 - ccm consult for acute encephalopathy agitated. DTs suspected  Interim History / Subjective:  x  Objective   Blood pressure (!) 165/92, pulse 92, temperature 98.3 F (36.8 C), temperature source Oral, resp. rate 19, height '5\' 2"'$  (1.575 m), weight 43.9 kg, SpO2 99 %.        Intake/Output Summary (Last 24 hours) at 06/25/2021 1340 Last data filed at 06/25/2021 1200 Gross per 24 hour  Intake 2992.26 ml  Output 2075 ml  Net 917.26 ml   Filed Weights   06/23/21 0100 06/25/21 0400  Weight: 44.9 kg 43.9 kg    Examination: General: Extremely frail female looks cachectic.  Lying in the bed.  Nurse at the bedside HENT: No elevated JVP no neck nodes no intubation Lungs: Clear to auscultation  bilaterally Cardiovascular: Tachycardic and hypertensive.  Normal heart sounds no murmurs Abdomen: Soft nontender no organomegaly Extremities: Lean and cachectic with muscular wasting Neuro: Alert and follows simple commands intermittently agitated GU: Not examined  Resolved Hospital Problem list   X  Assessment & Plan:  ASSESSMENT / PLAN:  PULMONARY  A:  Known greater than 20 pack smoking history Previous history of intubation secondary to agitated encephalopathy from alcohol withdrawal   06/25/2021 -> protecting airway  P:   Monitor closely in the ICU for intubation   NEUROLOGIC A:   Severe acute agitated encephalopathy = present on admission and getting worse -Combination of alcohol marijuana and cocaine P:   Start Precedex infusion - Start clonidine detox protocol = Continue lorazepam protocol    Best Practice (right click and "Reselect all SmartList Selections" daily)  According to the hospitalist   ATTESTATION & SIGNATURE   The patient Sherry Baker is critically ill with multiple organ systems failure and requires high complexity decision making for assessment and support, frequent evaluation and titration of therapies, application of advanced monitoring technologies and extensive interpretation of multiple databases.   Critical Care Time devoted to patient care services described in this note is  40  Minutes. This time reflects time of care of this signee Dr Brand Males. This critical care time does not reflect procedure time, or teaching time or supervisory time of PA/NP/Med student/Med Resident etc but could involve care discussion time     Dr. Brand Males, M.D., [facility].C.P Pulmonary and Critical Care Medicine Staff Physician [place] [facility] Pager: [phone], If no answer or between  15:00h - 7:00h: call [phone]  06/25/2021 1:40 PM   LABS    PULMONARY No results for input(s): PHART,  PCO2ART, PO2ART, HCO3, TCO2, O2SAT in the last 168 hours.  Invalid input(s): PCO2, PO2  CBC Recent Labs  Lab 06/22/21 1438 06/24/21 0309  HGB 11.7* 11.8*  HCT 36.1 36.8  WBC 3.4* 3.7*  PLT 166 154    COAGULATION Recent Labs  Lab 06/23/21 0100  INR 1.0    CARDIAC  No results for input(s): TROPONINI in the last 168 hours. No results for input(s): PROBNP in the last 168 hours.   CHEMISTRY Recent Labs  Lab 06/22/21 1438 06/23/21 0100 06/24/21 0309 06/25/21 0306  NA 139 137 136 135  K 3.5 3.5 3.1* 4.1  CL 109 106 106 106  CO2 21* 19* 21* 24  GLUCOSE 71 56* 73 88  BUN  22* 21* 14 9  CREATININE 1.03* 1.06* 1.10* 0.94  CALCIUM 9.1 9.2 8.7* 9.0  MG  --  2.0 1.7 2.1  PHOS  --  3.3 2.6 2.2*   Estimated Creatinine Clearance: 48 mL/min (by C-G formula based on SCr of 0.94 mg/dL).   LIVER Recent Labs  Lab 06/22/21 1438 06/23/21 0100 06/24/21 0309  AST 40  --  26  ALT 27  --  20  ALKPHOS 79  --  63  BILITOT 0.7  --  0.5  PROT 7.8  --  6.3*  ALBUMIN 3.3*  --  2.7*  INR  --  1.0  --      INFECTIOUS Recent Labs  Lab 06/23/21 0100  LATICACIDVEN 0.8  PROCALCITON <0.10     ENDOCRINE CBG (last 3)  Recent Labs    06/25/21 0427 06/25/21 0445 06/25/21 0824  GLUCAP 67* 139* 121*         IMAGING x48h  - image(s) personally visualized  -   highlighted in bold No results found.

## 2021-06-25 NOTE — Assessment & Plan Note (Signed)
--  as per malnutirition

## 2021-06-26 LAB — GLUCOSE, CAPILLARY
Glucose-Capillary: 122 mg/dL — ABNORMAL HIGH (ref 70–99)
Glucose-Capillary: 127 mg/dL — ABNORMAL HIGH (ref 70–99)
Glucose-Capillary: 127 mg/dL — ABNORMAL HIGH (ref 70–99)
Glucose-Capillary: 147 mg/dL — ABNORMAL HIGH (ref 70–99)
Glucose-Capillary: 149 mg/dL — ABNORMAL HIGH (ref 70–99)
Glucose-Capillary: 92 mg/dL (ref 70–99)

## 2021-06-26 LAB — BASIC METABOLIC PANEL
Anion gap: 8 (ref 5–15)
BUN: 9 mg/dL (ref 6–20)
CO2: 22 mmol/L (ref 22–32)
Calcium: 8.8 mg/dL — ABNORMAL LOW (ref 8.9–10.3)
Chloride: 109 mmol/L (ref 98–111)
Creatinine, Ser: 1.02 mg/dL — ABNORMAL HIGH (ref 0.44–1.00)
GFR, Estimated: >60 mL/min (ref >60)
Glucose, Bld: 119 mg/dL — ABNORMAL HIGH (ref 70–99)
Potassium: 3.6 mmol/L (ref 3.5–5.1)
Sodium: 139 mmol/L (ref 135–145)

## 2021-06-26 LAB — PHOSPHORUS: Phosphorus: 3.8 mg/dL (ref 2.5–4.6)

## 2021-06-26 LAB — MAGNESIUM: Magnesium: 2 mg/dL (ref 1.7–2.4)

## 2021-06-26 MED ORDER — POTASSIUM CHLORIDE CRYS ER 20 MEQ PO TBCR
40.0000 meq | EXTENDED_RELEASE_TABLET | Freq: Once | ORAL | Status: AC
  Administered 2021-06-26: 40 meq via ORAL
  Filled 2021-06-26: qty 2

## 2021-06-26 NOTE — Progress Notes (Signed)
Progress Note    Sherry Baker  [id number] DOB: 04-28-1966  DOA: 06/22/2021 PCP: Default, Provider, MD    Brief Narrative:    Medical records reviewed and are as summarized below:  Sherry Baker is an 55 y.o. female with PMH including alcohol use, severe DTs in the past requiring intubation and Precedex, crack cocaine use, cirrhosis presented [facility] for bilateral leg pain, confusion "out of her mind".  Last drink was that morning.  Recent crack cocaine use.  Admitted for acute alcohol withdrawal delirium, suspected drug-induced psychosis.  Assessment/Plan:   Principal Problem:   Delirium tremens ([place]) Active Problems:   Substance abuse (HCC)   Severe malnutrition (HCC)   Tobacco abuse   Cocaine abuse ([place])   CKD stage II with proteinuria   Normochromic normocytic anemia   Homelessness   Toxic encephalopathy   Alcoholic cirrhosis of liver without ascites ([place])   COVID-19 virus infection   Underweight   Protein-calorie malnutrition, severe   Delirium tremens ([place]) --  Precedex started on 8/23   Toxic encephalopathy -- Probably multifactorial including crack cocaine, alcohol, withdrawal from alcohol --supportive care   Cocaine abuse ([place]) -- Suspect played a role in toxic encephalopathy.  Recommend cessation.  TOC consultation.   Substance abuse ([place]) --TOC consultation   COVID-19 virus infection -- Remains asymptomatic, no hypoxia.  Chest x-ray no acute disease.  CRP within normal limits.  D-dimer  trending down, secondary to COVID.  Continue Paxlovid x 5 days   Alcoholic cirrhosis of liver without ascites (HCC) --LFTs stable --follow-up as an outpatient --stop alcohol    CKD stage II with proteinuria --follow-up as an outpatient   Severe malnutrition (HCC) Nutrition Status: Nutrition Problem: Severe Malnutrition Etiology: social / environmental circumstances (ongoing alcohol abuse) Signs/Symptoms: severe fat depletion, severe muscle  depletion Interventions: Boost Breeze, Juven, Prostat, MVI  Pressure  injury  Pressure Injury 06/23/21 Coccyx Lower;Medial Unstageable - Full thickness tissue loss in which the base of the injury is covered by slough (yellow, tan, gray, green or brown) and/or eschar (tan, brown or black) in the wound bed. (Active)  06/23/21 0100  Location: Coccyx  Location Orientation: Lower;Medial  Staging: Unstageable - Full thickness tissue loss in which the base of the injury is covered by slough (yellow, tan, gray, green or brown) and/or eschar (tan, brown or black) in the wound bed.  Wound Description (Comments):   Present on Admission: Yes       Family Communication/Anticipated D/C date and plan/Code Status   DVT prophylaxis: scd Code Status: Full Code.  Disposition Plan: Status is: Inpatient  Remains inpatient appropriate because:Inpatient level of care appropriate due to severity of illness  Dispo: The patient is from: Home              Anticipated d/c is to:  tbd              Patient currently is not medically stable to d/c.   Difficult to place patient No         Medical Consultants:   PCCM  Subjective:   Hungry this AM  Objective:    Vitals:   06/26/21 0228 06/26/21 0300 06/26/21 0400 06/26/21 0800  BP:  128/66 138/74 (!) 146/76  Pulse:  (!) 52 (!) 54 62  Resp:  (!) '27 19 20  '$ Temp: (!) 97.1 F (36.2 C)  98 F (36.7 C) 98.6 F (37 C)  TempSrc: Axillary  Axillary Oral  SpO2:  95%  95% 96%  Weight:      Height:        Intake/Output Summary (Last 24 hours) at 06/26/2021 1103 Last data filed at 06/26/2021 0300 Gross per 24 hour  Intake 2525.47 ml  Output 1200 ml  Net 1325.47 ml   Filed Weights   06/23/21 0100 06/25/21 0400  Weight: 44.9 kg 43.9 kg    Exam:  General: Appearance:    Thin female in no acute distress     Lungs:     respirations unlabored  Heart:    Normal heart rate.    MS:   All extremities are intact.    Neurologic:   Awake, alert,  cooperative, poor insight     Data Reviewed:   I have personally reviewed following labs and imaging studies:  Labs: Labs show the following:   Basic Metabolic Panel: Recent Labs  Lab 06/22/21 1438 06/23/21 0100 06/24/21 0309 06/25/21 0306 06/26/21 0326  NA 139 137 136 135 139  K 3.5 3.5 3.1* 4.1 3.6  CL 109 106 106 106 109  CO2 21* 19* 21* 24 22  GLUCOSE 71 56* 73 88 119*  BUN 22* 21* '14 9 9  '$ CREATININE 1.03* 1.06* 1.10* 0.94 1.02*  CALCIUM 9.1 9.2 8.7* 9.0 8.8*  MG  --  2.0 1.7 2.1 2.0  PHOS  --  3.3 2.6 2.2* 3.8   GFR Estimated Creatinine Clearance: 44.2 mL/min (A) (by C-G formula based on SCr of 1.02 mg/dL (H)). Liver Function Tests: Recent Labs  Lab 06/22/21 1438 06/24/21 0309  AST 40 26  ALT 27 20  ALKPHOS 79 63  BILITOT 0.7 0.5  PROT 7.8 6.3*  ALBUMIN 3.3* 2.7*   Recent Labs  Lab 06/22/21 1438 06/24/21 0309  LIPASE 63* 29   Recent Labs  Lab 06/23/21 0100  AMMONIA 25   Coagulation profile Recent Labs  Lab 06/23/21 0100  INR 1.0    CBC: Recent Labs  Lab 06/22/21 1438 06/24/21 0309  WBC 3.4* 3.7*  NEUTROABS 2.4  --   HGB 11.7* 11.8*  HCT 36.1 36.8  MCV 88.7 89.1  PLT 166 154   Cardiac Enzymes: Recent Labs  Lab 06/23/21 0100  CKTOTAL 71   BNP (last 3 results) No results for input(s): PROBNP in the last 8760 hours. CBG: Recent Labs  Lab 06/25/21 1819 06/25/21 2011 06/25/21 2345 06/26/21 0424 06/26/21 0854  GLUCAP 139* 134* 115* 127* 122*   D-Dimer: Recent Labs    06/24/21 0309 06/25/21 0306  DDIMER 0.55* 0.42   Hgb A1c: No results for input(s): HGBA1C in the last 72 hours. Lipid Profile: No results for input(s): CHOL, HDL, LDLCALC, TRIG, CHOLHDL, LDLDIRECT in the last 72 hours. Thyroid function studies: No results for input(s): TSH, T4TOTAL, T3FREE, THYROIDAB in the last 72 hours.  Invalid input(s): FREET3 Anemia work up: Recent Labs    06/24/21 0309 06/25/21 0306  FERRITIN 38 38   Sepsis Labs: Recent  Labs  Lab 06/22/21 1438 06/23/21 0100 06/24/21 0309  PROCALCITON  --  <0.10  --   WBC 3.4*  --  3.7*  LATICACIDVEN  --  0.8  --     Microbiology Recent Results (from the past 240 hour(s))  Resp Panel by RT-PCR (Flu A&B, Covid) Nasopharyngeal Swab     Status: Abnormal   Collection Time: 06/22/21  2:48 PM   Specimen: Nasopharyngeal Swab; Nasopharyngeal(NP) swabs in vial transport medium  Result Value Ref Range Status   SARS Coronavirus 2 by RT PCR  POSITIVE (A) NEGATIVE Final    Comment: RESULT CALLED TO, READ BACK BY AND VERIFIED WITH: Harlene Ramus, RN 06/22/21 1617 KDS (NOTE) SARS-CoV-2 target nucleic acids are DETECTED.  The SARS-CoV-2 RNA is generally detectable in upper respiratory specimens during the acute phase of infection. Positive results are indicative of the presence of the identified virus, but do not rule out bacterial infection or co-infection with other pathogens not detected by the test. Clinical correlation with patient history and other diagnostic information is necessary to determine patient infection status. The expected result is Negative.  Fact Sheet for Patients: EntrepreneurPulse.com.au  Fact Sheet for Healthcare Providers: IncredibleEmployment.be  This test is not yet approved or cleared by the [place] FDA and  has been authorized for detection and/or diagnosis of SARS-CoV-2 by FDA under an Emergency Use Authorization (EUA).  This EUA will remain in effect (meaning this test can be  used) for the duration of  the COVID-19 declaration under Section 564(b)(1) of the Act, 21 U.S.C. section 360bbb-3(b)(1), unless the authorization is terminated or revoked sooner.     Influenza A by PCR NEGATIVE NEGATIVE Final   Influenza B by PCR NEGATIVE NEGATIVE Final    Comment: (NOTE) The Xpert Xpress SARS-CoV-2/FLU/RSV plus assay is intended as an aid in the diagnosis of influenza from Nasopharyngeal swab specimens  and should not be used as a sole basis for treatment. Nasal washings and aspirates are unacceptable for Xpert Xpress SARS-CoV-2/FLU/RSV testing.  Fact Sheet for Patients: EntrepreneurPulse.com.au  Fact Sheet for Healthcare Providers: IncredibleEmployment.be  This test is not yet approved or cleared by the [place] FDA and has been authorized for detection and/or diagnosis of SARS-CoV-2 by FDA under an Emergency Use Authorization (EUA). This EUA will remain in effect (meaning this test can be used) for the duration of the COVID-19 declaration under Section 564(b)(1) of the Act, 21 U.S.C. section 360bbb-3(b)(1), unless the authorization is terminated or revoked.  Performed at [facility], [place] [address], [place], [place] [address]   MRSA Next Gen by PCR, Nasal     Status: None   Collection Time: 06/23/21 12:38 AM   Specimen: Nasal Mucosa; Nasal Swab  Result Value Ref Range Status   MRSA by PCR Next Gen NOT DETECTED NOT DETECTED Final    Comment: (NOTE) The GeneXpert MRSA Assay (FDA approved for NASAL specimens only), is one component of a comprehensive MRSA colonization surveillance program. It is not intended to diagnose MRSA infection nor to guide or monitor treatment for MRSA infections. Test performance is not FDA approved in patients less than 24 years old. Performed at [facility], [place] [address], [place],  [address]     Procedures and diagnostic studies:  No results found.  Medications:    (feeding supplement) PROSource Plus  30 mL Oral BID BM   vitamin C  250 mg Oral BID   busPIRone  15 mg Oral BID   Chlorhexidine Gluconate Cloth  6 each Topical Daily   citalopram  10 mg Oral Daily   cloNIDine  0.1 mg Oral QID   Followed by   Derrill Memo ON 06/27/2021] cloNIDine  0.1 mg Oral BH-qamhs   Followed by   Derrill Memo ON 06/30/2021] cloNIDine  0.1 mg Oral QAC breakfast   feeding supplement   1 Container Oral BID BM   folic acid  1 mg Oral Daily   gabapentin  100 mg Oral TID   mouth rinse  15 mL Mouth Rinse BID  multivitamin with minerals  1 tablet Oral Daily   nicotine  21 mg Transdermal Daily   nirmatrelvir/ritonavir EUA  3 tablet Oral BID   nutrition supplement (JUVEN)  1 packet Oral BID BM   pantoprazole  40 mg Oral Daily   thiamine  100 mg Oral Daily   Or   thiamine  100 mg Intravenous Daily   zinc sulfate  220 mg Oral Daily   Continuous Infusions:  dexmedetomidine (PRECEDEX) IV infusion 0.2 mcg/kg/hr (06/26/21 0300)   dextrose 5 % and 0.45% NaCl 100 mL/hr at 06/26/21 0300     LOS: 3 days   Geradine Girt  [facility]   How to contact the [facility] Attending or Consulting provider [place] or covering provider during after hours [place], for this patient?  Check the care team in [facility] and look for a) attending/consulting TRH provider listed and b) the [facility] team listed Log into [email or web address] and use [place]'s universal password to access. If you do not have the password, please contact the hospital operator. Locate the [facility] provider you are looking for under [facility] and page to a number that you can be directly reached. If you still have difficulty reaching the provider, please page the [facility] (Director on Call) for the Hospitalists listed on amion for assistance.  06/26/2021, 11:03 AM

## 2021-06-26 NOTE — Consult Note (Signed)
NAME:  Sherry Baker, MRN:  [id number], DOB:  04/20/1966, LOS: 3 ADMISSION DATE:  06/22/2021, CONSULTATION DATE:  06/25/21 REFERRING MD:  Dr Carlean Jews, CHIEF COMPLAINT:  Acute Agitated Encepphaloathy  History of Present Illness:  55 year old female CKD stage II, proteinuria alcoholic and smoker who is lean and cachectic and looks malnourished.  She has a previous history of intubation for DTs history of cirrhosis not otherwise specified, depression not otherwise specified brought in on 06/22/2021 due to confusion with prior intake of alcohol several hours earlier.  There is also a history of a fall.  She is found to be hypertensive in the ER.  She denies any history of bipolar.  She is unvaccinated against COVID and COVID was incidental positive.  She has been started on Palovid.  Urine tox at admission was positive for marijuana and cocaine.  Admission diagnoses acute alcohol withdrawal delirium with drug-induced psychosis associated with cocaine and marijuana toxicity.  She seems stable on 06/23/2021.  On 06/24/2021 she had worsening delirium requiring higher doses of lorazepam.  On 06/25/2021 she had further worsening try to climb out of the bed.  Critical care medicine consulted.  Order for Precedex has been given.  At the time of CCM evaluation Precedex have been started and she is somewhat improved but still requiring frequent reorientation.  At admission on 06/23/2021 she was also found to have lower medial unstageable full-thickness decub  Pertinent  Medical History   has a past medical history of Alcohol abuse, Allergy, Arthritis, Bipolar 1 disorder ([place]), Cancer ([place]) (01/21/2017), Cancer ([place]), Cirrhosis of liver ([place]), Depression, Genetic testing (03/24/2017), and Hypertension.   has a past surgical history that includes Mandible fracture surgery; Colonoscopy with propofol (Left, 01/21/2017); Abdominal perineal bowel resection (N/A, 06/18/2017); Fracture surgery; and Colon surgery.  Significant  Hospital Events: Including procedures, antibiotic start and stop dates in addition to other pertinent events   06/22/2021 - admit 06/25/21 - ccm consult for acute encephalopathy agitated. DTs suspected  Interim History / Subjective:   8/24 -not intubated.  Remains calm with Precedex on board but still requiring Precedex.  Has a food tray.  Blood pressure is much better.  Is also on clonidine detox protocol  Objective   Blood pressure (!) 144/78, pulse 77, temperature 98.6 F (37 C), temperature source Oral, resp. rate (!) 21, height '5\' 2"'$  (1.575 m), weight 43.9 kg, SpO2 96 %.        Intake/Output Summary (Last 24 hours) at 06/26/2021 1147 Last data filed at 06/26/2021 0300 Gross per 24 hour  Intake 2525.47 ml  Output 1200 ml  Net 1325.47 ml   Filed Weights   06/23/21 0100 06/25/21 0400  Weight: 44.9 kg 43.9 kg    Examination: Extremely frail cachectic female.  Lying in the bed and resting calm.  RASS sedation score is 0 to -1 equivalent.  Vital signs look stable.  Clear to auscultation abdomen soft.  Extremely muscular wasting.  Normal heart sounds.  Not agitated no tremors. Resolved Hospital Problem list   X  Assessment & Plan:  ASSESSMENT / PLAN:   Known greater than 20 pack smoking history Previous history of intubation secondary to agitated encephalopathy from alcohol withdrawal   06/26/2021 -> protecting airway  P:   Monitor closely in the ICU for intubation     Severe acute agitated encephalopathy = present on admission and getting worse and critical care consulted 06/25/2021. Combination of alcohol marijuana and cocaine  06/26/2021: Much improved but still requiring  Precedex infusion  P:   Continue Precedex infusion Continue clonidine detox protocol = Continue lorazepam protocol   Rest of issues which are at baseline and present on and prior to admission -Alcoholism -Substance abuse including cocaine and marijuana -Cirrhosis without ascites -Failure to  thrive - Cachexia - Physical deconditioning --Severe protein calorie malnutrition  Plan - Treatment according to the hospitalist  Best Practice (right click and "Reselect all SmartList Selections" daily)  According to the hospitalist    ATTESTATION & SIGNATURE   The patient Sherry Baker is critically ill with multiple organ systems failure and requires high complexity decision making for assessment and support, frequent evaluation and titration of therapies, application of advanced monitoring technologies and extensive interpretation of multiple databases.   Critical Care Time devoted to patient care services described in this note is  35  Minutes. This time reflects time of care of this signee Dr Brand Males. This critical care time does not reflect procedure time, or teaching time or supervisory time of PA/NP/Med student/Med Resident etc but could involve care discussion time     Dr. Brand Males, M.D., [facility].C.P Pulmonary and Critical Care Medicine Staff Physician [place] [facility] Pager: [phone], If no answer or between  15:00h - 7:00h: call [phone]  06/26/2021 12:01 PM    LABS    PULMONARY No results for input(s): PHART, PCO2ART, PO2ART, HCO3, TCO2, O2SAT in the last 168 hours.  Invalid input(s): PCO2, PO2  CBC Recent Labs  Lab 06/22/21 1438 06/24/21 0309  HGB 11.7* 11.8*  HCT 36.1 36.8  WBC 3.4* 3.7*  PLT 166 154    COAGULATION Recent Labs  Lab 06/23/21 0100  INR 1.0    CARDIAC  No results for input(s): TROPONINI in the last 168 hours. No results for input(s): PROBNP in the last 168 hours.   CHEMISTRY Recent Labs  Lab 06/22/21 1438 06/23/21 0100 06/24/21 0309 06/25/21 0306 06/26/21 0326  NA 139 137 136 135 139  K 3.5 3.5 3.1* 4.1 3.6  CL 109 106 106 106 109  CO2 21* 19* 21* 24 22  GLUCOSE 71 56* 73 88 119*  BUN 22* 21* '14 9 9  '$ CREATININE 1.03* 1.06* 1.10* 0.94 1.02*  CALCIUM  9.1 9.2 8.7* 9.0 8.8*  MG  --  2.0 1.7 2.1 2.0  PHOS  --  3.3 2.6 2.2* 3.8   Estimated Creatinine Clearance: 44.2 mL/min (A) (by C-G formula based on SCr of 1.02 mg/dL (H)).   LIVER Recent Labs  Lab 06/22/21 1438 06/23/21 0100 06/24/21 0309  AST 40  --  26  ALT 27  --  20  ALKPHOS 79  --  63  BILITOT 0.7  --  0.5  PROT 7.8  --  6.3*  ALBUMIN 3.3*  --  2.7*  INR  --  1.0  --      INFECTIOUS Recent Labs  Lab 06/23/21 0100  LATICACIDVEN 0.8  PROCALCITON <0.10     ENDOCRINE CBG (last 3)  Recent Labs    06/25/21 2345 06/26/21 0424 06/26/21 0854  GLUCAP 115* 127* 122*         IMAGING x48h  - image(s) personally visualized  -   highlighted in bold No results found.

## 2021-06-27 DIAGNOSIS — F10231 Alcohol dependence with withdrawal delirium: Secondary | ICD-10-CM | POA: Diagnosis not present

## 2021-06-27 LAB — MAGNESIUM: Magnesium: 1.9 mg/dL (ref 1.7–2.4)

## 2021-06-27 LAB — BASIC METABOLIC PANEL
Anion gap: 4 — ABNORMAL LOW (ref 5–15)
BUN: 22 mg/dL — ABNORMAL HIGH (ref 6–20)
CO2: 23 mmol/L (ref 22–32)
Calcium: 8.7 mg/dL — ABNORMAL LOW (ref 8.9–10.3)
Chloride: 112 mmol/L — ABNORMAL HIGH (ref 98–111)
Creatinine, Ser: 1.25 mg/dL — ABNORMAL HIGH (ref 0.44–1.00)
GFR, Estimated: 52 mL/min — ABNORMAL LOW (ref >60)
Glucose, Bld: 106 mg/dL — ABNORMAL HIGH (ref 70–99)
Potassium: 4.8 mmol/L (ref 3.5–5.1)
Sodium: 139 mmol/L (ref 135–145)

## 2021-06-27 LAB — CBC
HCT: 37.9 % (ref 36.0–46.0)
Hemoglobin: 12.3 g/dL (ref 12.0–15.0)
MCH: 28.9 pg (ref 26.0–34.0)
MCHC: 32.5 g/dL (ref 30.0–36.0)
MCV: 89.2 fL (ref 80.0–100.0)
Platelets: 176 10*3/uL (ref 150–400)
RBC: 4.25 MIL/uL (ref 3.87–5.11)
RDW: 14.9 % (ref 11.5–15.5)
WBC: 4.7 10*3/uL (ref 4.0–10.5)
nRBC: 0 % (ref 0.0–0.2)

## 2021-06-27 LAB — GLUCOSE, CAPILLARY
Glucose-Capillary: 98 mg/dL (ref 70–99)
Glucose-Capillary: 108 mg/dL — ABNORMAL HIGH (ref 70–99)

## 2021-06-27 LAB — PHOSPHORUS: Phosphorus: 3 mg/dL (ref 2.5–4.6)

## 2021-06-27 MED ORDER — AMLODIPINE BESYLATE 10 MG PO TABS
10.0000 mg | ORAL_TABLET | Freq: Every day | ORAL | Status: DC
Start: 2021-06-27 — End: 2021-06-27
  Administered 2021-06-27: 10 mg via ORAL
  Filled 2021-06-27: qty 1

## 2021-06-27 MED ORDER — AMLODIPINE BESYLATE 5 MG PO TABS
5.0000 mg | ORAL_TABLET | Freq: Every day | ORAL | Status: DC
  Administered 2021-06-28: 5 mg via ORAL
  Filled 2021-06-27: qty 1

## 2021-06-27 NOTE — TOC Initial Note (Signed)
Transition of Care [facility]) - Initial/Assessment Note    Patient Details  Name: Sherry Baker MRN: [id number] Date of Birth: 1966-09-24  Transition of Care [facility]) CM/SW Contact:    Leeroy Cha, RN Phone Number: 06/27/2021, 8:40 AM  Clinical Narrative:                 55 year old female CKD stage II, proteinuria alcoholic and smoker who is lean and cachectic and looks malnourished.  She has a previous history of intubation for DTs history of cirrhosis not otherwise specified, depression not otherwise specified brought in on 06/22/2021 due to confusion with prior intake of alcohol several hours earlier.  There is also a history of a fall.  She is found to be hypertensive in the ER.  She denies any history of bipolar.  She is unvaccinated against COVID and COVID was incidental positive.  She has been started on Palovid.  Urine tox at admission was positive for marijuana and cocaine.  Admission diagnoses acute alcohol withdrawal delirium with drug-induced psychosis associated with cocaine and marijuana toxicity.  She seems stable on 06/23/2021.  On 06/24/2021 she had worsening delirium requiring higher doses of lorazepam.  On 06/25/2021 she had further worsening try to climb out of the bed.  Critical care medicine consulted.  Order for Precedex has been given.  At the time of CCM evaluation Precedex have been started and she is somewhat improved but still requiring frequent reorientation.   At admission on 06/23/2021 she was also found to have lower medial unstageable full-thickness decub TOC PLAN OF CARE: will follow for hhc -decubitus- Following for progression- etoh w/d Expected Discharge Plan: [place] Barriers to Discharge: Continued Medical Work up   Patient Goals and CMS Choice   CMS Medicare.gov Compare Post Acute Care list provided to:: Patient    Expected Discharge Plan and Services Expected Discharge Plan: [place]   Discharge Planning Services:  CM Consult   Living arrangements for the past 2 months: Apartment                                      Prior Living Arrangements/Services Living arrangements for the past 2 months: Apartment Lives with:: Self Patient language and need for interpreter reviewed:: Yes Do you feel safe going back to the place where you live?: Yes            Criminal Activity/Legal Involvement Pertinent to Current Situation/Hospitalization: No - Comment as needed  Activities of Daily Living Home Assistive Devices/Equipment: None ADL Screening (condition at time of admission) Patient's cognitive ability adequate to safely complete daily activities?: No Is the patient deaf or have difficulty hearing?: No Does the patient have difficulty seeing, even when wearing glasses/contacts?: Yes Does the patient have difficulty concentrating, remembering, or making decisions?: Yes Patient able to express need for assistance with ADLs?: Yes Does the patient have difficulty dressing or bathing?: No Independently performs ADLs?: Yes (appropriate for developmental age) Does the patient have difficulty walking or climbing stairs?: Yes Weakness of Legs: None Weakness of Arms/Hands: None  Permission Sought/Granted                  Emotional Assessment Appearance:: Appears stated age     Orientation: : Fluctuating Orientation (Suspected and/or reported [place]) Alcohol / Substance Use: Alcohol Use Psych Involvement: No (comment)  Admission diagnosis:  Alcohol withdrawal delirium (  [place]) [F10.231] 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] Altered mental status, unspecified altered mental status type [R41.82] COVID-19 virus infection [U07.1] Patient Active Problem List   Diagnosis Date Noted   Underweight 06/25/2021   Protein-calorie malnutrition, severe 06/25/2021   Homelessness 06/23/2021   Toxic encephalopathy [id number]   Alcoholic cirrhosis of liver without  ascites ([place]) 06/23/2021   COVID-19 virus infection 06/23/2021   Delirium tremens ([place]) 06/22/2021   Altered mental status 03/29/2021   Alcohol withdrawal ([place]) 03/28/2021   Alcohol abuse 06/14/2019   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   Normochromic normocytic anemia 06/06/2019   Pressure injury of skin 06/06/2019   Substance abuse ([place])    Seizure ([place]) 05/21/2019   Acute encephalopathy 05/18/2019   Acute lower UTI 05/18/2019   Polysubstance abuse ([place]) 05/18/2019   Wound dehiscence 07/10/2017   Genetic testing 03/24/2017   Hypertension 02/18/2017   GERD (gastroesophageal reflux disease) 02/18/2017   Rectal cancer ([place]) 01/27/2017   Abdominal pain, epigastric    Rectal mass    Fever    Anemia 01/16/2017   Abdominal pain    Constipation    Alcohol abuse    Tobacco abuse    Cocaine abuse ([place])    Bipolar affective disorder ([place])    Severe malnutrition ([place]) 02/13/2014   Suicide ideation 02/12/2014   Suicidal ideation 02/11/2014   Alcohol intoxication ([place]) 02/11/2014   Transaminitis 02/11/2014   Substance abuse ([place]) 02/11/2014   Depression 02/11/2014   Thrombocytopenia ([place]) 02/11/2014   PCP:  Default, Provider, MD Pharmacy:   [facility] [place], [place] [address] [place] [address] Phone: [phone] Fax: [phone]     Social Determinants of Health (SDOH) Interventions    Readmission Risk Interventions No flowsheet data found.

## 2021-06-27 NOTE — Progress Notes (Signed)
Progress Note    Sherry Baker  [id number] DOB: [birth year]  DOA: 06/22/2021 PCP: Default, Provider, MD    Brief Narrative:    Medical records reviewed and are as summarized below:  Sherry Baker is an 55 y.o. female with PMH including alcohol use, severe DTs in the past requiring intubation and Precedex, crack cocaine use, cirrhosis [place] [facility] [place] pain, confusion "out of her mind".  Last drink was that morning.  Recent crack cocaine use.  Admitted for acute alcohol withdrawal delirium, suspected drug-induced psychosis.  Assessment/Plan:   Principal Problem:   Delirium tremens ([place]) Active Problems:   Substance abuse ([place])   Severe malnutrition (HCC)   Tobacco abuse   Cocaine abuse ([place])   CKD stage II with proteinuria   Normochromic normocytic anemia   Homelessness   Toxic encephalopathy   Alcoholic cirrhosis of liver without ascites ([place])   COVID-19 virus infection   Underweight   Protein-calorie malnutrition, severe   Delirium tremens ([place]) --  Precedex started on 8/23- weaned off a, of 8/25   Toxic encephalopathy -- Probably multifactorial including crack cocaine, alcohol, withdrawal from alcohol --supportive care -- resolved   Cocaine abuse ([place]) -- Suspect played a role in toxic encephalopathy.  Recommend cessation.  TOC consultation.   Substance abuse ([place]) --TOC consultation   COVID-19 virus infection -- Remains asymptomatic, no hypoxia.  Chest x-ray no acute disease.  CRP within normal limits.  D-dimer  trending down, secondary to COVID.  Continue Paxlovid x 5 days   Alcoholic cirrhosis of liver without ascites (HCC) --LFTs stable --follow-up as an outpatient --stop alcohol    CKD stage II with proteinuria --follow-up as an outpatient   Severe malnutrition (HCC) Nutrition Status: Nutrition Problem: Severe Malnutrition Etiology: social / environmental circumstances (ongoing alcohol abuse) Signs/Symptoms:  severe fat depletion, severe muscle depletion Interventions: Boost Breeze, Juven, Prostat, MVI  Pressure  injury  Pressure Injury 06/23/21 Coccyx Lower;Medial Unstageable - Full thickness tissue loss in which the base of the injury is covered by slough (yellow, tan, gray, green or brown) and/or eschar (tan, brown or black) in the wound bed. (Active)  06/23/21 0100  Location: Coccyx  Location Orientation: Lower;Medial  Staging: Unstageable - Full thickness tissue loss in which the base of the injury is covered by slough (yellow, tan, gray, green or brown) and/or eschar (tan, brown or black) in the wound bed.  Wound Description (Comments):   Present on Admission: Yes    Pt consult   Family Communication/Anticipated D/C date and plan/Code Status   DVT prophylaxis: scd Code Status: Full Code.  Disposition Plan: Status is: Inpatient  Remains inpatient appropriate because:Inpatient level of care appropriate due to severity of illness  Dispo: The patient is from: Home              Anticipated d/c is to:  tbd              Patient currently is not medically stable to d/c.   Difficult to place patient No         Medical Consultants:   PCCM  Subjective:   Says she wants to live in an ALF  Objective:    Vitals:   06/27/21 0800 06/27/21 0820 06/27/21 0900 06/27/21 1000  BP: (!) 201/84 (!) 165/88 118/73 (!) 150/85  Pulse: (!) 52 67 70 62  Resp: '17 19 17 15  '$ Temp: 98.2 F (36.8 C)     TempSrc: Oral  SpO2: 92% 96% 95% 98%  Weight:      Height:        Intake/Output Summary (Last 24 hours) at 06/27/2021 1213 Last data filed at 06/27/2021 1000 Gross per 24 hour  Intake 292.63 ml  Output 1650 ml  Net -1357.37 ml   Filed Weights   06/23/21 0100 06/25/21 0400  Weight: 44.9 kg 43.9 kg    Exam:   General: Appearance:    Thin female in no acute distress     Lungs:     respirations unlabored  Heart:    Normal heart rate. Normal rhythm. No murmurs, rubs, or gallops.     MS:   All extremities are intact.    Neurologic:   Awake, alert, oriented x 3. No apparent focal neurological           defect.        Data Reviewed:   I have personally reviewed following labs and imaging studies:  Labs: Labs show the following:   Basic Metabolic Panel: Recent Labs  Lab 06/23/21 0100 06/24/21 0309 06/25/21 0306 06/26/21 0326 06/27/21 0301  NA 137 136 135 139 139  K 3.5 3.1* 4.1 3.6 4.8  CL 106 106 106 109 112*  CO2 19* 21* '24 22 23  '$ GLUCOSE 56* 73 88 119* 106*  BUN 21* '14 9 9 '$ 22*  CREATININE 1.06* 1.10* 0.94 1.02* 1.25*  CALCIUM 9.2 8.7* 9.0 8.8* 8.7*  MG 2.0 1.7 2.1 2.0 1.9  PHOS 3.3 2.6 2.2* 3.8 3.0   GFR Estimated Creatinine Clearance: 36.1 mL/min (A) (by C-G formula based on SCr of 1.25 mg/dL (H)). Liver Function Tests: Recent Labs  Lab 06/22/21 1438 06/24/21 0309  AST 40 26  ALT 27 20  ALKPHOS 79 63  BILITOT 0.7 0.5  PROT 7.8 6.3*  ALBUMIN 3.3* 2.7*   Recent Labs  Lab 06/22/21 1438 06/24/21 0309  LIPASE 63* 29   Recent Labs  Lab 06/23/21 0100  AMMONIA 25   Coagulation profile Recent Labs  Lab 06/23/21 0100  INR 1.0    CBC: Recent Labs  Lab 06/22/21 1438 06/24/21 0309 06/27/21 0301  WBC 3.4* 3.7* 4.7  NEUTROABS 2.4  --   --   HGB 11.7* 11.8* 12.3  HCT 36.1 36.8 37.9  MCV 88.7 89.1 89.2  PLT 166 154 176   Cardiac Enzymes: Recent Labs  Lab 06/23/21 0100  CKTOTAL 71   BNP (last 3 results) No results for input(s): PROBNP in the last 8760 hours. CBG: Recent Labs  Lab 06/26/21 1705 06/26/21 1941 06/26/21 2348 06/27/21 0300 06/27/21 0817  GLUCAP 147* 92 127* 98 108*   D-Dimer: Recent Labs    06/25/21 0306  DDIMER 0.42   Hgb A1c: No results for input(s): HGBA1C in the last 72 hours. Lipid Profile: No results for input(s): CHOL, HDL, LDLCALC, TRIG, CHOLHDL, LDLDIRECT in the last 72 hours. Thyroid function studies: No results for input(s): TSH, T4TOTAL, T3FREE, THYROIDAB in the last 72  hours.  Invalid input(s): FREET3 Anemia work up: Recent Labs    06/25/21 0306  FERRITIN 38   Sepsis Labs: Recent Labs  Lab 06/22/21 1438 06/23/21 0100 06/24/21 0309 06/27/21 0301  PROCALCITON  --  <0.10  --   --   WBC 3.4*  --  3.7* 4.7  LATICACIDVEN  --  0.8  --   --     Microbiology Recent Results (from the past 240 hour(s))  Resp Panel by RT-PCR (Flu A&B, Covid) Nasopharyngeal Swab  Status: Abnormal   Collection Time: 06/22/21  2:48 PM   Specimen: Nasopharyngeal Swab; Nasopharyngeal(NP) swabs in vial transport medium  Result Value Ref Range Status   SARS Coronavirus 2 by RT PCR POSITIVE (A) NEGATIVE Final    Comment: RESULT CALLED TO, READ BACK BY AND VERIFIED WITH: Harlene Ramus, RN 06/22/21 1617 KDS (NOTE) SARS-CoV-2 target nucleic acids are DETECTED.  The SARS-CoV-2 RNA is generally detectable in upper respiratory specimens during the acute phase of infection. Positive results are indicative of the presence of the identified virus, but do not rule out bacterial infection or co-infection with other pathogens not detected by the test. Clinical correlation with patient history and other diagnostic information is necessary to determine patient infection status. The expected result is Negative.  Fact Sheet for Patients: EntrepreneurPulse.com.au  Fact Sheet for Healthcare Providers: IncredibleEmployment.be  This test is not yet approved or cleared by the [place] FDA and  has been authorized for detection and/or diagnosis of SARS-CoV-2 by FDA under an Emergency Use Authorization (EUA).  This EUA will remain in effect (meaning this test can be  used) for the duration of  the COVID-19 declaration under Section 564(b)(1) of the Act, 21 U.S.C. section 360bbb-3(b)(1), unless the authorization is terminated or revoked sooner.     Influenza A by PCR NEGATIVE NEGATIVE Final   Influenza B by PCR NEGATIVE NEGATIVE Final     Comment: (NOTE) The Xpert Xpress SARS-CoV-2/FLU/RSV plus assay is intended as an aid in the diagnosis of influenza from Nasopharyngeal swab specimens and should not be used as a sole basis for treatment. Nasal washings and aspirates are unacceptable for Xpert Xpress SARS-CoV-2/FLU/RSV testing.  Fact Sheet for Patients: EntrepreneurPulse.com.au  Fact Sheet for Healthcare Providers: IncredibleEmployment.be  This test is not yet approved or cleared by the [place] FDA and has been authorized for detection and/or diagnosis of SARS-CoV-2 by FDA under an Emergency Use Authorization (EUA). This EUA will remain in effect (meaning this test can be used) for the duration of the COVID-19 declaration under Section 564(b)(1) of the Act, 21 U.S.C. section 360bbb-3(b)(1), unless the authorization is terminated or revoked.  Performed at [facility], [place] [address], [place], [place] [address]   MRSA Next Gen by PCR, Nasal     Status: None   Collection Time: 06/23/21 12:38 AM   Specimen: Nasal Mucosa; Nasal Swab  Result Value Ref Range Status   MRSA by PCR Next Gen NOT DETECTED NOT DETECTED Final    Comment: (NOTE) The GeneXpert MRSA Assay (FDA approved for NASAL specimens only), is one component of a comprehensive MRSA colonization surveillance program. It is not intended to diagnose MRSA infection nor to guide or monitor treatment for MRSA infections. Test performance is not FDA approved in patients less than 70 years old. Performed at [facility], [place] [address] [place], [place] [address]     Procedures and diagnostic studies:  No results found.  Medications:    (feeding supplement) PROSource Plus  30 mL Oral BID BM   vitamin C  250 mg Oral BID   busPIRone  15 mg Oral BID   Chlorhexidine Gluconate Cloth  6 each Topical Daily   citalopram  10 mg Oral Daily   cloNIDine  0.1 mg Oral QID   Followed by    cloNIDine  0.1 mg Oral BH-qamhs   Followed by   Derrill Memo ON 06/30/2021] cloNIDine  0.1 mg Oral QAC breakfast   feeding supplement  1 Container  Oral BID BM   folic acid  1 mg Oral Daily   gabapentin  100 mg Oral TID   mouth rinse  15 mL Mouth Rinse BID   multivitamin with minerals  1 tablet Oral Daily   nicotine  21 mg Transdermal Daily   nirmatrelvir/ritonavir EUA  3 tablet Oral BID   nutrition supplement (JUVEN)  1 packet Oral BID BM   pantoprazole  40 mg Oral Daily   thiamine  100 mg Oral Daily   Or   thiamine  100 mg Intravenous Daily   zinc sulfate  220 mg Oral Daily   Continuous Infusions:  dexmedetomidine (PRECEDEX) IV infusion Stopped (06/27/21 [id number])     LOS: 4 days   Geradine Girt  [facility]   How to contact the [facility] Attending or Consulting provider [place] or covering provider during after hours [place], for this patient?  Check the care team in [facility] and look for a) attending/consulting TRH provider listed and b) the [facility] team listed Log into [email or web address] and use [place]'s universal password to access. If you do not have the password, please contact the hospital operator. Locate the [facility] provider you are looking for under [facility] and page to a number that you can be directly reached. If you still have difficulty reaching the provider, please page the [facility] (Director on Call) for the Hospitalists listed on amion for assistance.  06/27/2021, 12:13 PM

## 2021-06-27 NOTE — Progress Notes (Signed)
NAME:  Sherry Baker, MRN:  [id number], DOB:  [birth year], LOS: 4 ADMISSION DATE:  06/22/2021, CONSULTATION DATE:  06/25/21 REFERRING MD:  Dr Carlean Jews, CHIEF COMPLAINT:  Acute Agitated Encepphaloathy  BRIEF  55 year old female CKD stage II, proteinuria alcoholic and smoker who is lean and cachectic and looks malnourished.  She has a previous history of intubation for DTs history of cirrhosis not otherwise specified, depression not otherwise specified brought in on 06/22/2021 due to confusion with prior intake of alcohol several hours earlier.  There is also a history of a fall.  She is found to be hypertensive in the ER.  She denies any history of bipolar.  She is unvaccinated against COVID and COVID was incidental positive.  She has been started on Palovid.  Urine tox at admission was positive for marijuana and cocaine.  Admission diagnoses acute alcohol withdrawal delirium with drug-induced psychosis associated with cocaine and marijuana toxicity.  She seems stable on 06/23/2021.  On 06/24/2021 she had worsening delirium requiring higher doses of lorazepam.  On 06/25/2021 she had further worsening try to climb out of the bed.  Critical care medicine consulted.  Order for Precedex has been given.  At the time of CCM evaluation Precedex have been started and she is somewhat improved but still requiring frequent reorientation.  At admission on 06/23/2021 she was also found to have lower medial unstageable full-thickness decub  Pertinent  Medical History   has a past medical history of Alcohol abuse, Allergy, Arthritis, Bipolar 1 disorder ([place]), Cancer ([place]) (01/21/2017), Cancer ([place]), Cirrhosis of liver ([place]), Depression, Genetic testing (03/24/2017), and Hypertension.   has a past surgical history that includes Mandible fracture surgery; Colonoscopy with propofol (Left, 01/21/2017); Abdominal perineal bowel resection (N/A, 06/18/2017); Fracture surgery; and Colon surgery.  Significant Hospital  Events: Including procedures, antibiotic start and stop dates in addition to other pertinent events   06/22/2021 - admit. Copvid pcr - positive 06/25/21 - ccm consult for acute encephalopathy agitated. DTs suspected 8/24 -not intubated.  Remains calm with Precedex on board but still requiring Precedex.  Has a food tray.  Blood pressure is much better.  Is also on clonidine detox protocol  Interim History / Subjective:   8/25  - off precedex gtt. Eating . Hypertensive. Feels beter but reports fatigue  Objective   Blood pressure (!) 173/94, pulse 65, temperature 98.4 F (36.9 C), temperature source Oral, resp. rate 19, height '5\' 2"'$  (1.575 m), weight 43.9 kg, SpO2 98 %.        Intake/Output Summary (Last 24 hours) at 06/27/2021 1514 Last data filed at 06/27/2021 1000 Gross per 24 hour  Intake 292.63 ml  Output 1050 ml  Net -757.37 ml   Filed Weights   06/23/21 0100 06/25/21 0400  Weight: 44.9 kg 43.9 kg    Examination: Frail female. Looking much better. CTA bilaerally. Normal heart sounds. ABd sof. Oriented x 3. Cachectic    LABS    PULMONARY No results for input(s): PHART, PCO2ART, PO2ART, HCO3, TCO2, O2SAT in the last 168 hours.  Invalid input(s): PCO2, PO2  CBC Recent Labs  Lab 06/22/21 1438 06/24/21 0309 06/27/21 0301  HGB 11.7* 11.8* 12.3  HCT 36.1 36.8 37.9  WBC 3.4* 3.7* 4.7  PLT 166 154 176    COAGULATION Recent Labs  Lab 06/23/21 0100  INR 1.0    CARDIAC  No results for input(s): TROPONINI in the last 168 hours. No results for input(s): PROBNP in the last 168 hours.  CHEMISTRY Recent Labs  Lab 06/23/21 0100 06/24/21 0309 06/25/21 0306 06/26/21 0326 06/27/21 0301  NA 137 136 135 139 139  K 3.5 3.1* 4.1 3.6 4.8  CL 106 106 106 109 112*  CO2 19* 21* '24 22 23  '$ GLUCOSE 56* 73 88 119* 106*  BUN 21* '14 9 9 '$ 22*  CREATININE 1.06* 1.10* 0.94 1.02* 1.25*  CALCIUM 9.2 8.7* 9.0 8.8* 8.7*  MG 2.0 1.7 2.1 2.0 1.9  PHOS 3.3 2.6 2.2* 3.8 3.0    Estimated Creatinine Clearance: 36.1 mL/min (A) (by C-G formula based on SCr of 1.25 mg/dL (H)).   LIVER Recent Labs  Lab 06/22/21 1438 06/23/21 0100 06/24/21 0309  AST 40  --  26  ALT 27  --  20  ALKPHOS 79  --  63  BILITOT 0.7  --  0.5  PROT 7.8  --  6.3*  ALBUMIN 3.3*  --  2.7*  INR  --  1.0  --      INFECTIOUS Recent Labs  Lab 06/23/21 0100  LATICACIDVEN 0.8  PROCALCITON <0.10     ENDOCRINE CBG (last 3)  Recent Labs    06/26/21 2348 06/27/21 0300 06/27/21 0817  GLUCAP 127* 98 108*         IMAGING x48h  - image(s) personally visualized  -   highlighted in bold No results found.   Resolved Hospital Problem list   X  Assessment & Plan:  ASSESSMENT / PLAN:   Known greater than 20 pack smoking history Previous history of intubation secondary to agitated encephalopathy from alcohol withdrawal   06/27/2021 -> protecting airway and better since encephalopathy resolved  P:   Monitor closely in the ICU for intubation     Severe acute agitated encephalopathy = present on admission and getting worse and critical care consulted 06/25/2021. Combination of alcohol marijuana and cocaine  06/27/21 - resolved and off precedex all moorning  P:   DC  Precedex infusion from [facility] Continue clonidine detox protocol = Continue lorazepam protocol   Rest of issues which are at baseline and present on and prior to admission -Alcoholism -Substance abuse including cocaine and marijuana -Cirrhosis without ascites -Failure to thrive - Cachexia - Physical deconditioning --Severe protein calorie malnutrition  Plan - Treatment according to the hospitalist  Best Practice (right click and "Reselect all SmartList Selections" daily)   Move to med surg CCM will sign off      SIGNATURE    Dr. Brand Males, M.D., F.C.C.P,  Pulmonary and Critical Care Medicine Staff Physician, [place] Director - Interstitial Lung Disease  Program   Pulmonary [place], [place], [address]  NPI Number:  NPI [id number]  Pager: [phone], If no answer  -> Check AMION or Try [phone] Telephone (clinical office): [phone] Telephone (research): [phone]  3:29 PM 06/27/2021

## 2021-06-27 NOTE — Plan of Care (Signed)
  Problem: Education: Goal: Knowledge of risk factors and measures for prevention of condition will improve Outcome: Progressing   Problem: Coping: Goal: Psychosocial and spiritual needs will be supported Outcome: Progressing   Problem: Respiratory: Goal: Will maintain a patent airway Outcome: Progressing Goal: Complications related to the disease process, condition or treatment will be avoided or minimized Outcome: Progressing   Problem: Education: Goal: Knowledge of disease or condition will improve Outcome: Progressing Goal: Understanding of discharge needs will improve Outcome: Progressing   Problem: Health Behavior/Discharge Planning: Goal: Ability to identify changes in lifestyle to reduce recurrence of condition will improve Outcome: Progressing Goal: Identification of resources available to assist in meeting health care needs will improve Outcome: Progressing   Problem: Physical Regulation: Goal: Complications related to the disease process, condition or treatment will be avoided or minimized Outcome: Progressing   Problem: Safety: Goal: Ability to remain free from injury will improve Outcome: Progressing   Problem: Education: Goal: Knowledge of General Education information will improve Description: Including pain rating scale, medication(s)/side effects and non-pharmacologic comfort measures Outcome: Progressing   Problem: Health Behavior/Discharge Planning: Goal: Ability to manage health-related needs will improve Outcome: Progressing   Problem: Clinical Measurements: Goal: Ability to maintain clinical measurements within normal limits will improve Outcome: Progressing Goal: Will remain free from infection Outcome: Progressing Goal: Diagnostic test results will improve Outcome: Progressing Goal: Respiratory complications will improve Outcome: Progressing Goal: Cardiovascular complication will be avoided Outcome: Progressing   Problem: Activity: Goal:  Risk for activity intolerance will decrease Outcome: Progressing   Problem: Nutrition: Goal: Adequate nutrition will be maintained Outcome: Progressing   Problem: Coping: Goal: Level of anxiety will decrease Outcome: Progressing   Problem: Elimination: Goal: Will not experience complications related to bowel motility Outcome: Progressing Goal: Will not experience complications related to urinary retention Outcome: Progressing   Problem: Pain Managment: Goal: General experience of comfort will improve Outcome: Progressing   Problem: Safety: Goal: Ability to remain free from injury will improve Outcome: Progressing   Problem: Skin Integrity: Goal: Risk for impaired skin integrity will decrease Outcome: Progressing

## 2021-06-27 NOTE — Evaluation (Signed)
Physical Therapy Evaluation Patient Details Name: Sherry Baker MRN: [id number] DOB: [birth year] Today's Date: 06/27/2021   History of Present Illness  55 year old female [place] from [place] with c/o LE pain and admitting to ETOH use. Has been through DT's while in hospital. PMH:  polysubstance abuse, alcohol abuse, cocaine abuse, bipolar disease, arthritis, rectal colon cancer status post colostomy, cirrhosis.  Clinical Impression  Pt admitted as above and presenting with functional mobility limitations 2* generalized weakness, bil LE pain, decreased endurance and balance deficits.  Dependent on acute stay progress, pt would benefit from follow up rehab at SNF level to maximize IND and safety prior to dc to prior living arrangement with very little support.    Follow Up Recommendations SNF    Equipment Recommendations  None recommended by PT    Recommendations for Other Services OT consult     Precautions / Restrictions Precautions Precautions: Fall Restrictions Weight Bearing Restrictions: No      Mobility  Bed Mobility Overal bed mobility: Needs Assistance Bed Mobility: Supine to Sit     Supine to sit: Min assist     General bed mobility comments: Increased time with min assist and use of bed pad to complete transition to EOB sitting    Transfers Overall transfer level: Needs assistance Equipment used: Rolling walker (2 wheeled) Transfers: Sit to/from Stand Sit to Stand: Min assist;Mod assist;+2 safety/equipment;From elevated surface         General transfer comment: cues for safe transition position, use of UEs to self assist and LE management  Ambulation/Gait Ambulation/Gait assistance: Min assist;+2 physical assistance;+2 safety/equipment Gait Distance (Feet): 40 Feet Assistive device: Rolling walker (2 wheeled) Gait Pattern/deviations: Step-through pattern;Decreased step length - right;Decreased step length - left;Shuffle;Trunk flexed Gait velocity:  decr   General Gait Details: cues for sequence, posture and position from RW.  Physical assist to stability/support and RW management  Stairs            Wheelchair Mobility    Modified Rankin (Stroke Patients Only)       Balance Overall balance assessment: Needs assistance Sitting-balance support: No upper extremity supported;Feet supported Sitting balance-Leahy Scale: Fair     Standing balance support: Bilateral upper extremity supported Standing balance-Leahy Scale: Poor                               Pertinent Vitals/Pain Pain Assessment: 0-10 Pain Score: 8  Pain Location: LEs - L >R Pain Descriptors / Indicators: Aching;Sore Pain Intervention(s): Limited activity within patient's tolerance;Monitored during session    [place] expects to be discharged to:: Shelter/Homeless                 Additional Comments: Pt states on this admit that she has a rollator and lives in a level apartment with a friend that stays with her all the time however per chart, it states that she is homeless and recently got out of jail.    Prior Function Level of Independence: Needs assistance   Gait / Transfers Assistance Needed: states she used a rollator and is unsteady at times           Hand Dominance   Dominant Hand: Right    Extremity/Trunk Assessment   Upper Extremity Assessment Upper Extremity Assessment: Generalized weakness    Lower Extremity Assessment Lower Extremity Assessment: Generalized weakness    Cervical / Trunk Assessment Cervical / Trunk Assessment: Normal  Communication  Communication: No difficulties  Cognition Arousal/Alertness: Awake/alert Behavior During Therapy: WFL for tasks assessed/performed;Anxious Overall Cognitive Status: Within Functional Limits for tasks assessed                                 General Comments: Pt very fearful of falling      General Comments      Exercises      Assessment/Plan    PT Assessment Patient needs continued PT services  PT Problem List Decreased strength;Decreased range of motion;Decreased activity tolerance;Decreased balance;Decreased mobility;Decreased knowledge of use of DME;Decreased safety awareness;Pain       PT Treatment Interventions DME instruction;Gait training;Stair training;Functional mobility training;Therapeutic activities;Therapeutic exercise;Balance training;Patient/family education    PT Goals (Current goals can be found in the Care Plan section)  Acute Rehab PT Goals Patient Stated Goal: Regain IND PT Goal Formulation: With patient Time For Goal Achievement: 07/17/21 Potential to Achieve Goals: Good    Frequency Min 3X/week   Barriers to discharge Decreased caregiver support Mixed stories regarding living arrangement but pt with limited support in all scenarios    Co-evaluation               AM-PAC PT "6 Clicks" Mobility  Outcome Measure Help needed turning from your back to your side while in a flat bed without using bedrails?: A Little Help needed moving from lying on your back to sitting on the side of a flat bed without using bedrails?: A Little Help needed moving to and from a bed to a chair (including a wheelchair)?: A Lot Help needed standing up from a chair using your arms (e.g., wheelchair or bedside chair)?: A Lot Help needed to walk in hospital room?: A Little Help needed climbing 3-5 steps with a railing? : A Lot 6 Click Score: 15    End of Session Equipment Utilized During Treatment: Gait belt Activity Tolerance: Patient tolerated treatment well;Patient limited by fatigue Patient left: in chair;with call bell/phone within reach;with chair alarm set Nurse Communication: Mobility status PT Visit Diagnosis: Difficulty in walking, not elsewhere classified (R26.2);Muscle weakness (generalized) (M62.81);Unsteadiness on feet (R26.81);History of falling (Z91.81);Pain Pain - part of body:  Leg    Time: [id number] PT Time Calculation (min) (ACUTE ONLY): 25 min   Charges:   PT Evaluation $PT Eval Low Complexity: 1 Low PT Treatments $Gait Training: 8-22 mins        Debe Coder PT Acute Rehabilitation Services Pager [phone] Office [phone]   Sharmaine Bain 06/27/2021, 5:18 PM

## 2021-06-28 DIAGNOSIS — F10231 Alcohol dependence with withdrawal delirium: Secondary | ICD-10-CM | POA: Diagnosis not present

## 2021-06-28 LAB — COMPREHENSIVE METABOLIC PANEL
ALT: 14 U/L (ref 0–44)
AST: 15 U/L (ref 15–41)
Albumin: 2.7 g/dL — ABNORMAL LOW (ref 3.5–5.0)
Alkaline Phosphatase: 71 U/L (ref 38–126)
Anion gap: 5 (ref 5–15)
BUN: 40 mg/dL — ABNORMAL HIGH (ref 6–20)
CO2: 24 mmol/L (ref 22–32)
Calcium: 9 mg/dL (ref 8.9–10.3)
Chloride: 107 mmol/L (ref 98–111)
Creatinine, Ser: 1.34 mg/dL — ABNORMAL HIGH (ref 0.44–1.00)
GFR, Estimated: 47 mL/min — ABNORMAL LOW (ref >60)
Glucose, Bld: 99 mg/dL (ref 70–99)
Potassium: 4.5 mmol/L (ref 3.5–5.1)
Sodium: 136 mmol/L (ref 135–145)
Total Bilirubin: 0.5 mg/dL (ref 0.3–1.2)
Total Protein: 6.5 g/dL (ref 6.5–8.1)

## 2021-06-28 LAB — PHOSPHORUS: Phosphorus: 2.8 mg/dL (ref 2.5–4.6)

## 2021-06-28 LAB — MAGNESIUM: Magnesium: 1.8 mg/dL (ref 1.7–2.4)

## 2021-06-28 MED ORDER — HYDROCODONE-ACETAMINOPHEN 5-325 MG PO TABS
1.0000 | ORAL_TABLET | Freq: Four times a day (QID) | ORAL | Status: DC | PRN
  Administered 2021-06-28 – 2021-06-30 (×5): 1 via ORAL
  Filled 2021-06-28 (×5): qty 1

## 2021-06-28 NOTE — Progress Notes (Signed)
Progress Note    Sherry Baker  [id number] DOB: 1966/08/04  DOA: 06/22/2021 PCP: Default, Provider, MD    Brief Narrative:    Medical records reviewed and are as summarized below:  Sherry Baker is an 55 y.o. female with PMH including alcohol use, severe DTs in the past requiring intubation and Precedex, crack cocaine use, cirrhosis [place] pain, confusion "out of her mind".  Last drink was that morning.  Recent crack cocaine use.  Admitted for acute alcohol withdrawal delirium, suspected drug-induced psychosis.  PT/OT recommending SNF.   Assessment/Plan:   Principal Problem:   Delirium tremens ([place]) Active Problems:   Substance abuse ([place])   Severe malnutrition (HCC)   Tobacco abuse   Cocaine abuse ([place])   CKD stage II with proteinuria   Normochromic normocytic anemia   Homelessness   Toxic encephalopathy   Alcoholic cirrhosis of liver without ascites ([place])   COVID-19 virus infection   Underweight   Protein-calorie malnutrition, severe   Delirium tremens ([place]) --  Precedex started on 8/23- weaned off as of 8/25   Toxic encephalopathy -- Probably multifactorial including crack cocaine, alcohol, withdrawal from alcohol --supportive care -- resolved   Cocaine abuse ([place]) -- Suspect played a role in toxic encephalopathy.  Recommend cessation.  TOC consultation.   Substance abuse ([place]) --TOC consultation   COVID-19 virus infection -- Remains asymptomatic, no hypoxia.  Chest x-ray no acute disease.  CRP within normal limits.  D-dimer  trending down, secondary to COVID.  Completed Paxlovid x 5 days   Alcoholic cirrhosis of liver without ascites (HCC) --LFTs stable --follow-up as an outpatient --stop alcohol   CKD stage II with proteinuria --follow-up as an outpatient   Severe malnutrition (HCC) Nutrition Status: Nutrition Problem: Severe Malnutrition Etiology: social / environmental circumstances (ongoing alcohol  abuse) Signs/Symptoms: severe fat depletion, severe muscle depletion Interventions: Boost Breeze, Juven, Prostat, MVI  Pressure  injury  Pressure Injury 06/23/21 Coccyx Lower;Medial Unstageable - Full thickness tissue loss in which the base of the injury is covered by slough (yellow, tan, gray, green or brown) and/or eschar (tan, brown or black) in the wound bed. (Active)  06/23/21 0100  Location: Coccyx  Location Orientation: Lower;Medial  Staging: Unstageable - Full thickness tissue loss in which the base of the injury is covered by slough (yellow, tan, gray, green or brown) and/or eschar (tan, brown or black) in the wound bed.  Wound Description (Comments):   Present on Admission: Yes    Pt consult- SNF, TOC concult   Family Communication/Anticipated D/C date and plan/Code Status   DVT prophylaxis: scd Code Status: Full Code.  Disposition Plan: Status is: Inpatient  Remains inpatient appropriate because:Inpatient level of care appropriate due to severity of illness  Dispo: The patient is from: Home              Anticipated d/c is to:  tbd - SNF vs ALF                Difficult to place patient yes         Medical Consultants:   PCCM  Subjective:   Says she wants to live in an ALF  Objective:    Vitals:   06/28/21 0800 06/28/21 0806 06/28/21 0853 06/28/21 1218  BP: (!) 144/122 114/78    Pulse: 71     Resp: 18     Temp:   98.4 F (36.9 C) 97.6 F (36.4 C)  TempSrc:   Oral Oral  SpO2: 92%     Weight:      Height:        Intake/Output Summary (Last 24 hours) at 06/28/2021 1313 Last data filed at 06/28/2021 1100 Gross per 24 hour  Intake 1200 ml  Output 2140 ml  Net -940 ml   Filed Weights   06/23/21 0100 06/25/21 0400  Weight: 44.9 kg 43.9 kg    Exam:   General: Appearance:    Thin chronically ill appearing female in no acute distress     Lungs:     Clear to auscultation bilaterally, respirations unlabored  Heart:    Normal heart rate. Normal  rhythm. No murmurs, rubs, or gallops.    MS:   All extremities are intact.    Neurologic:   Awake, alert, oriented x 3. No apparent focal neurological           defect.        Data Reviewed:   I have personally reviewed following labs and imaging studies:  Labs: Labs show the following:   Basic Metabolic Panel: Recent Labs  Lab 06/24/21 0309 06/25/21 0306 06/26/21 0326 06/27/21 0301 06/28/21 0311  NA 136 135 139 139 136  K 3.1* 4.1 3.6 4.8 4.5  CL 106 106 109 112* 107  CO2 21* '24 22 23 24  '$ GLUCOSE 73 88 119* 106* 99  BUN '14 9 9 '$ 22* 40*  CREATININE 1.10* 0.94 1.02* 1.25* 1.34*  CALCIUM 8.7* 9.0 8.8* 8.7* 9.0  MG 1.7 2.1 2.0 1.9 1.8  PHOS 2.6 2.2* 3.8 3.0 2.8   GFR Estimated Creatinine Clearance: 33.6 mL/min (A) (by C-G formula based on SCr of 1.34 mg/dL (H)). Liver Function Tests: Recent Labs  Lab 06/22/21 1438 06/24/21 0309 06/28/21 0311  AST 40 26 15  ALT '27 20 14  '$ ALKPHOS 79 63 71  BILITOT 0.7 0.5 0.5  PROT 7.8 6.3* 6.5  ALBUMIN 3.3* 2.7* 2.7*   Recent Labs  Lab 06/22/21 1438 06/24/21 0309  LIPASE 63* 29   Recent Labs  Lab 06/23/21 0100  AMMONIA 25   Coagulation profile Recent Labs  Lab 06/23/21 0100  INR 1.0    CBC: Recent Labs  Lab 06/22/21 1438 06/24/21 0309 06/27/21 0301  WBC 3.4* 3.7* 4.7  NEUTROABS 2.4  --   --   HGB 11.7* 11.8* 12.3  HCT 36.1 36.8 37.9  MCV 88.7 89.1 89.2  PLT 166 154 176   Cardiac Enzymes: Recent Labs  Lab 06/23/21 0100  CKTOTAL 71   BNP (last 3 results) No results for input(s): PROBNP in the last 8760 hours. CBG: Recent Labs  Lab 06/26/21 1705 06/26/21 1941 06/26/21 2348 06/27/21 0300 06/27/21 0817  GLUCAP 147* 92 127* 98 108*   D-Dimer: No results for input(s): DDIMER in the last 72 hours.  Hgb A1c: No results for input(s): HGBA1C in the last 72 hours. Lipid Profile: No results for input(s): CHOL, HDL, LDLCALC, TRIG, CHOLHDL, LDLDIRECT in the last 72 hours. Thyroid function  studies: No results for input(s): TSH, T4TOTAL, T3FREE, THYROIDAB in the last 72 hours.  Invalid input(s): FREET3 Anemia work up: No results for input(s): VITAMINB12, FOLATE, FERRITIN, TIBC, IRON, RETICCTPCT in the last 72 hours.  Sepsis Labs: Recent Labs  Lab 06/22/21 1438 06/23/21 0100 06/24/21 0309 06/27/21 0301  PROCALCITON  --  <0.10  --   --   WBC 3.4*  --  3.7* 4.7  LATICACIDVEN  --  0.8  --   --  Microbiology Recent Results (from the past 240 hour(s))  Resp Panel by RT-PCR (Flu A&B, Covid) Nasopharyngeal Swab     Status: Abnormal   Collection Time: 06/22/21  2:48 PM   Specimen: Nasopharyngeal Swab; Nasopharyngeal(NP) swabs in vial transport medium  Result Value Ref Range Status   SARS Coronavirus 2 by RT PCR POSITIVE (A) NEGATIVE Final    Comment: RESULT CALLED TO, READ BACK BY AND VERIFIED WITH: Harlene Ramus, RN 06/22/21 1617 KDS (NOTE) SARS-CoV-2 target nucleic acids are DETECTED.  The SARS-CoV-2 RNA is generally detectable in upper respiratory specimens during the acute phase of infection. Positive results are indicative of the presence of the identified virus, but do not rule out bacterial infection or co-infection with other pathogens not detected by the test. Clinical correlation with patient history and other diagnostic information is necessary to determine patient infection status. The expected result is Negative.  Fact Sheet for Patients: EntrepreneurPulse.com.au  Fact Sheet for Healthcare Providers: IncredibleEmployment.be  This test is not yet approved or cleared by the [place] FDA and  has been authorized for detection and/or diagnosis of SARS-CoV-2 by FDA under an Emergency Use Authorization (EUA).  This EUA will remain in effect (meaning this test can be  used) for the duration of  the COVID-19 declaration under Section 564(b)(1) of the Act, 21 U.S.C. section 360bbb-3(b)(1), unless the authorization  is terminated or revoked sooner.     Influenza A by PCR NEGATIVE NEGATIVE Final   Influenza B by PCR NEGATIVE NEGATIVE Final    Comment: (NOTE) The Xpert Xpress SARS-CoV-2/FLU/RSV plus assay is intended as an aid in the diagnosis of influenza from Nasopharyngeal swab specimens and should not be used as a sole basis for treatment. Nasal washings and aspirates are unacceptable for Xpert Xpress SARS-CoV-2/FLU/RSV testing.  Fact Sheet for Patients: EntrepreneurPulse.com.au  Fact Sheet for Healthcare Providers: IncredibleEmployment.be  This test is not yet approved or cleared by the [place] FDA and has been authorized for detection and/or diagnosis of SARS-CoV-2 by FDA under an Emergency Use Authorization (EUA). This EUA will remain in effect (meaning this test can be used) for the duration of the COVID-19 declaration under Section 564(b)(1) of the Act, 21 U.S.C. section 360bbb-3(b)(1), unless the authorization is terminated or revoked.  Performed at [facility], [place] [address], [place], [place] [address]   MRSA Next Gen by PCR, Nasal     Status: None   Collection Time: 06/23/21 12:38 AM   Specimen: Nasal Mucosa; Nasal Swab  Result Value Ref Range Status   MRSA by PCR Next Gen NOT DETECTED NOT DETECTED Final    Comment: (NOTE) The GeneXpert MRSA Assay (FDA approved for NASAL specimens only), is one component of a comprehensive MRSA colonization surveillance program. It is not intended to diagnose MRSA infection nor to guide or monitor treatment for MRSA infections. Test performance is not FDA approved in patients less than 3 years old. Performed at [facility], [place] [address] [place], [place] [address]     Procedures and diagnostic studies:  No results found.  Medications:    (feeding supplement) PROSource Plus  30 mL Oral BID BM   amLODipine  5 mg Oral Daily   vitamin C  250 mg Oral  BID   busPIRone  15 mg Oral BID   Chlorhexidine Gluconate Cloth  6 each Topical Daily   citalopram  10 mg Oral Daily   cloNIDine  0.1 mg Oral BH-qamhs   Followed by   [  START ON 06/30/2021] cloNIDine  0.1 mg Oral QAC breakfast   feeding supplement  1 Container Oral BID BM   folic acid  1 mg Oral Daily   gabapentin  100 mg Oral TID   mouth rinse  15 mL Mouth Rinse BID   multivitamin with minerals  1 tablet Oral Daily   nicotine  21 mg Transdermal Daily   nutrition supplement (JUVEN)  1 packet Oral BID BM   pantoprazole  40 mg Oral Daily   thiamine  100 mg Oral Daily   Or   thiamine  100 mg Intravenous Daily   zinc sulfate  220 mg Oral Daily   Continuous Infusions:     LOS: 5 days   Geradine Girt  [facility]   How to contact the [facility] Attending or Consulting provider [place] or covering provider during after hours [place], for this patient?  Check the care team in [facility] and look for a) attending/consulting TR[facility] provider listed and b) the [facility] team listed Log into [email or web address] and use [place] universal password to access. If you do not have the password, please contact the hospital operator. Locate the [facility] provider you are looking for under [facility] and page to a number that you can be directly reached. If you still have difficulty reaching the provider, please page the [facility] (Director on Call) for the Hospitalists listed on amion for assistance.  06/28/2021, 1:13 PM

## 2021-06-28 NOTE — Evaluation (Signed)
Occupational Therapy Evaluation Patient Details Name: Sherry Baker MRN: [id number] DOB: 17-Jul-1966 Today's Date: 06/28/2021    History of Present Illness 55 year old female [place] with c/o LE pain and admitting to ETOH use. Has been through DT's while in hospital. PMH:  polysubstance abuse, alcohol abuse, cocaine abuse, bipolar disease, arthritis, rectal colon cancer status post colostomy, cirrhosis.   Clinical Impression   Per chart review patient is homeless, however patient told PT she was staying with friend and using rollator for ambulation. Currently patient presents with global weakness, decreased activity tolerance and needing min A for safety with functional ambulation + transfers. Patient taking small shuffled steps, reports needing hip replacements "but I'm scared I may come out worse." Patient able to doff soiled underwear with min G in standing, supervision for sink side hygiene and set up-min G for upper body and lower body bathing seated on toilet. Due to high fall risk, decreased endurance and overall safety would recommend short term rehab at D/C from acute care to maximize patient independence with daily routine. Acute OT to follow.     Follow Up Recommendations  SNF    Equipment Recommendations  None recommended by OT       Precautions / Restrictions Precautions Precautions: Fall Restrictions Weight Bearing Restrictions: No      Mobility Bed Mobility Overal bed mobility: Modified Independent             General bed mobility comments: HOB elevated    Transfers Overall transfer level: Needs assistance Equipment used: Rolling walker (2 wheeled) Transfers: Sit to/from Stand Sit to Stand: Min assist         General transfer comment: for safety, takes small shuffled steps with ambulation. also needs verbal cues for safe hand placement    Balance Overall balance assessment: Needs assistance Sitting-balance support: Feet  supported Sitting balance-Leahy Scale: Good     Standing balance support: No upper extremity supported;During functional activity Standing balance-Leahy Scale: Fair Standing balance comment: static standing at sink without UE support                           ADL either performed or assessed with clinical judgement   ADL Overall ADL's : Needs assistance/impaired Eating/Feeding: Independent   Grooming: Wash/dry hands;Supervision/safety;Standing   Upper Body Bathing: Set up;Sitting   Lower Body Bathing: Min guard;Sitting/lateral leans;Sit to/from stand   Upper Body Dressing : Set up;Sitting   Lower Body Dressing: Min guard;Sit to/from stand;Sitting/lateral leans Lower Body Dressing Details (indicate cue type and reason): able to doff soiled mesh underwear Toilet Transfer: Minimal assistance;Ambulation;RW Toilet Transfer Details (indicate cue type and reason): patient taking small shuffled steps, reports needing bilateral hip replacements "but I'm scared to do it" min A for safety as she is mildly unsteady. Toileting- Water quality scientist and Hygiene: Min guard;Sitting/lateral lean;Sit to/from Sales promotion account executive Details (indicate cue type and reason): patient able to pull down mesh underwear in standing min G for safety, and doff remainder of the way in sitting on toilet as they were soiled     Functional mobility during ADLs: Minimal assistance;Rolling walker;Cueing for safety        Pertinent Vitals/Pain Pain Assessment: Faces Faces Pain Scale: Hurts little more Pain Location: rectum 2* hx rectal CA Pain Descriptors / Indicators: Sore Pain Intervention(s): Monitored during session     Hand Dominance Right   Extremity/Trunk Assessment Upper Extremity Assessment Upper Extremity Assessment: Generalized weakness  Lower Extremity Assessment Lower Extremity Assessment: Defer to PT evaluation   Cervical / Trunk Assessment Cervical / Trunk  Assessment: Normal   Communication Communication Communication: No difficulties   Cognition Arousal/Alertness: Awake/alert Behavior During Therapy: Impulsive (mild) Overall Cognitive Status: Within Functional Limits for tasks assessed                                 General Comments: patient does follow directions however needing 1 redirection as she leans over on toilet to reach for bag.   General Comments  difficulty maintaining accurate pleth wave, once in chair and resting reading low 90s on room air            Home Living Family/patient expects to be discharged to:: Shelter/Homeless                                 Additional Comments: Pt states on this admit that she has a rollator and lives in a level apartment with a friend that stays with her all the time however per chart, it states that she is homeless and recently got out of jail.      Prior Functioning/Environment Level of Independence: Needs assistance  Gait / Transfers Assistance Needed: states she used a rollator and is unsteady at times              OT Problem List: Decreased strength;Decreased activity tolerance;Impaired balance (sitting and/or standing);Decreased safety awareness      OT Treatment/Interventions: Self-care/ADL training;Therapeutic exercise;Therapeutic activities;Patient/family education;Balance training    OT Goals(Current goals can be found in the care plan section) Acute Rehab OT Goals Patient Stated Goal: get better OT Goal Formulation: With patient Time For Goal Achievement: 07/12/21 Potential to Achieve Goals: Good  OT Frequency: Min 2X/week    AM-PAC OT "6 Clicks" Daily Activity     Outcome Measure Help from another person eating meals?: None Help from another person taking care of personal grooming?: A Little Help from another person toileting, which includes using toliet, bedpan, or urinal?: A Little Help from another person bathing (including  washing, rinsing, drying)?: A Little Help from another person to put on and taking off regular upper body clothing?: A Little Help from another person to put on and taking off regular lower body clothing?: A Little 6 Click Score: 19   End of Session Equipment Utilized During Treatment: Rolling walker;Gait belt Nurse Communication: Mobility status  Activity Tolerance: Patient tolerated treatment well Patient left: in chair;with call bell/phone within reach;with chair alarm set  OT Visit Diagnosis: Unsteadiness on feet (R26.81);Muscle weakness (generalized) (M62.81)                Time: [id number] OT Time Calculation (min): 27 min Charges:  OT General Charges $OT Visit: 1 Visit OT Evaluation $OT Eval Low Complexity: 1 Low OT Treatments $Self Care/Home Management : 8-22 mins  Delbert Phenix OT OT pager: [place] 06/28/2021, 1:02 PM

## 2021-06-29 DIAGNOSIS — F14951 Cocaine use, unspecified with cocaine-induced psychotic disorder with hallucinations: Secondary | ICD-10-CM | POA: Diagnosis not present

## 2021-06-29 DIAGNOSIS — K7030 Alcoholic cirrhosis of liver without ascites: Secondary | ICD-10-CM | POA: Diagnosis not present

## 2021-06-29 DIAGNOSIS — U071 COVID-19: Secondary | ICD-10-CM | POA: Diagnosis not present

## 2021-06-29 DIAGNOSIS — F10231 Alcohol dependence with withdrawal delirium: Secondary | ICD-10-CM | POA: Diagnosis not present

## 2021-06-29 LAB — BASIC METABOLIC PANEL
Anion gap: 7 (ref 5–15)
BUN: 67 mg/dL — ABNORMAL HIGH (ref 6–20)
CO2: 23 mmol/L (ref 22–32)
Calcium: 9 mg/dL (ref 8.9–10.3)
Chloride: 104 mmol/L (ref 98–111)
Creatinine, Ser: 1.67 mg/dL — ABNORMAL HIGH (ref 0.44–1.00)
GFR, Estimated: 36 mL/min — ABNORMAL LOW (ref >60)
Glucose, Bld: 95 mg/dL (ref 70–99)
Potassium: 4.3 mmol/L (ref 3.5–5.1)
Sodium: 134 mmol/L — ABNORMAL LOW (ref 135–145)

## 2021-06-29 LAB — CBC
HCT: 34.3 % — ABNORMAL LOW (ref 36.0–46.0)
Hemoglobin: 10.9 g/dL — ABNORMAL LOW (ref 12.0–15.0)
MCH: 28.6 pg (ref 26.0–34.0)
MCHC: 31.8 g/dL (ref 30.0–36.0)
MCV: 90 fL (ref 80.0–100.0)
Platelets: 177 10*3/uL (ref 150–400)
RBC: 3.81 MIL/uL — ABNORMAL LOW (ref 3.87–5.11)
RDW: 14.7 % (ref 11.5–15.5)
WBC: 5 10*3/uL (ref 4.0–10.5)
nRBC: 0 % (ref 0.0–0.2)

## 2021-06-29 MED ORDER — SODIUM CHLORIDE 0.9 % IV SOLN: INTRAVENOUS | Status: DC

## 2021-06-29 MED ORDER — BUSPIRONE HCL 5 MG PO TABS
15.0000 mg | ORAL_TABLET | Freq: Two times a day (BID) | ORAL | Status: DC
  Administered 2021-06-29 – 2021-06-30 (×3): 15 mg via ORAL
  Filled 2021-06-29 (×3): qty 3

## 2021-06-29 MED ORDER — ENOXAPARIN SODIUM 30 MG/0.3ML IJ SOSY
30.0000 mg | PREFILLED_SYRINGE | INTRAMUSCULAR | Status: DC
  Administered 2021-06-29: 30 mg via SUBCUTANEOUS
  Filled 2021-06-29: qty 0.3

## 2021-06-29 NOTE — Progress Notes (Addendum)
[facility]  PROGRESS NOTE  Sherry Baker [id number] DOB: 13-Jul-1966 DOA: 06/22/2021 PCP: Default, Provider, MD   Brief HPI:   55 year old female with a history of alcohol abuse, cocaine abuse, hypertension, CKD stage III, GERD, bipolar 1, depression, tobacco abuse presented from gestation reporting bilateral leg pain.  She was found to be COVID-positive in the ED. Patient admitted with alcohol withdrawal delirium, altered mental status.   Subjective   C/o left lower quadrant abdominal pain, tender to palpation around colostomy bag.   Assessment/Plan:   Alcohol withdrawal syndrome -Resolved -Patient was started on Precedex on 8/23, weaned off as of 8/25 -Continue thiamine, folate  Abdominal pain -Patient complains of left lower quadrant abdominal pain, around colostomy site -She has a history of rectal cancer many years ago -Has colostomy bag for past 8 years -She only has mild guarding, she is afebrile, does not appear toxic.  CBC done today shows WBC 5.0 -Continue Vicodin as needed, if pain persists consider CT abdomen/pelvis to rule out underlying infectious etiology  Toxic encephalopathy -Multifactorial from alcohol withdrawal syndrome, crack cocaine use -Resolved -TOC consulted for substance abuse rehab as outpatient  COVID-19 infection -Asymptomatic; no hypoxemia -Chest x-ray showed no acute disease -CRP 0.6, D-dimer 0.42 -Patient completed 5 days of Paxlovid  Alcoholic cirrhosis of liver without ascites -LFTs are stable, follow-up GI as outpatient  Acute kidney injury -creatinine up to 1.67, likely poor p.o. intake/dehydration.  We will start normal saline at 100 mill per hour for 48 hours.  Scheduled medications:    (feeding supplement) PROSource Plus  30 mL Oral BID BM   vitamin C  250 mg Oral BID   busPIRone  15 mg Oral BID   Chlorhexidine Gluconate Cloth  6 each Topical Daily   citalopram  10 mg Oral Daily   [START ON 06/30/2021] cloNIDine  0.1  mg Oral QAC breakfast   enoxaparin (LOVENOX) injection  30 mg Subcutaneous Q24H   feeding supplement  1 Container Oral BID BM   folic acid  1 mg Oral Daily   gabapentin  100 mg Oral TID   mouth rinse  15 mL Mouth Rinse BID   multivitamin with minerals  1 tablet Oral Daily   nicotine  21 mg Transdermal Daily   nutrition supplement (JUVEN)  1 packet Oral BID BM   pantoprazole  40 mg Oral Daily   thiamine  100 mg Oral Daily   Or   thiamine  100 mg Intravenous Daily   zinc sulfate  220 mg Oral Daily         Data Reviewed:   CBG:  Recent Labs  Lab 06/26/21 1705 06/26/21 1941 06/26/21 2348 06/27/21 0300 06/27/21 0817  GLUCAP 147* 92 127* 98 108*    SpO2: 97 % O2 Flow Rate (L/min): 3 L/min    Vitals:   06/29/21 1300 06/29/21 1400 06/29/21 1600 06/29/21 1624  BP:  121/69 125/62   Pulse: 76 74 67   Resp: '16 11 11   '$ Temp:    97.6 F (36.4 C)  TempSrc:    Oral  SpO2: 97% 94% 97%   Weight:      Height:         Intake/Output Summary (Last 24 hours) at 06/29/2021 1722 Last data filed at 06/29/2021 1629 Gross per 24 hour  Intake 1017.06 ml  Output 2500 ml  Net -1482.94 ml    08/25 1901 - 08/27 0700 In: 640 [P.O.:640] Out: 2940 [Urine:2900]  Filed Weights  06/23/21 0100 06/25/21 0400  Weight: 44.9 kg 43.9 kg    CBC:  Recent Labs  Lab 06/24/21 0309 06/27/21 0301 06/29/21 0951  WBC 3.7* 4.7 5.0  HGB 11.8* 12.3 10.9*  HCT 36.8 37.9 34.3*  PLT 154 176 177  MCV 89.1 89.2 90.0  MCH 28.6 28.9 28.6  MCHC 32.1 32.5 31.8  RDW 14.6 14.9 14.7    Complete metabolic panel:  Recent Labs  Lab 06/23/21 0100 06/24/21 0309 06/25/21 0306 06/26/21 0326 06/27/21 0301 06/28/21 0311 06/29/21 0317  NA 137 136 135 139 139 136 134*  K 3.5 3.1* 4.1 3.6 4.8 4.5 4.3  CL 106 106 106 109 112* 107 104  CO2 19* 21* '24 22 23 24 23  '$ GLUCOSE 56* 73 88 119* 106* 99 95  BUN 21* '14 9 9 '$ 22* 40* 67*  CREATININE 1.06* 1.10* 0.94 1.02* 1.25* 1.34* 1.67*  CALCIUM 9.2 8.7*  9.0 8.8* 8.7* 9.0 9.0  AST  --  26  --   --   --  15  --   ALT  --  20  --   --   --  14  --   ALKPHOS  --  63  --   --   --  71  --   BILITOT  --  0.5  --   --   --  0.5  --   ALBUMIN  --  2.7*  --   --   --  2.7*  --   MG 2.0 1.7 2.1 2.0 1.9 1.8  --   CRP 0.6  --   --   --   --   --   --   DDIMER 0.80* 0.55* 0.42  --   --   --   --   PROCALCITON <0.10  --   --   --   --   --   --   LATICACIDVEN 0.8  --   --   --   --   --   --   INR 1.0  --   --   --   --   --   --   TSH 3.564  --   --   --   --   --   --   AMMONIA 25  --   --   --   --   --   --   BNP 485.8*  --   --   --   --   --   --     Recent Labs  Lab 06/24/21 0309  LIPASE 29    Recent Labs  Lab 06/23/21 0100 06/24/21 0309 06/25/21 0306  CRP 0.6  --   --   DDIMER 0.80* 0.55* 0.42  BNP 485.8*  --   --   PROCALCITON <0.10  --   --     ------------------------------------------------------------------------------------------------------------------ No results for input(s): CHOL, HDL, LDLCALC, TRIG, CHOLHDL, LDLDIRECT in the last 72 hours.  No results found for: HGBA1C ------------------------------------------------------------------------------------------------------------------ No results for input(s): TSH, T4TOTAL, T3FREE, THYROIDAB in the last 72 hours.  Invalid input(s): FREET3 ------------------------------------------------------------------------------------------------------------------ No results for input(s): VITAMINB12, FOLATE, FERRITIN, TIBC, IRON, RETICCTPCT in the last 72 hours.  Coagulation profile Recent Labs  Lab 06/23/21 0100  INR 1.0   No results for input(s): DDIMER in the last 72 hours.  Cardiac Enzymes Recent Labs  Lab 06/23/21 0100  CKTOTAL 71    ------------------------------------------------------------------------------------------------------------------    Component Value Date/Time   BNP 485.8 (H)  06/23/2021 0100     Antibiotics: Anti-infectives (From admission,  onward)    Start     Dose/Rate Route Frequency Ordered Stop   06/23/21 1000  nirmatrelvir/ritonavir EUA (PAXLOVID) 3 tablet        3 tablet Oral 2 times daily 06/23/21 0102 06/27/21 2351        Radiology Reports  No results found.    DVT prophylaxis: Lovenox  Code Status: Full code  Family Communication: No family at bedside   Consultants:   Procedures:     Objective    Physical Examination:   General: Appears in no acute distress Cardiovascular: S1-S2, regular, no murmur auscultated Respiratory: Clear to auscultation bilaterally Abdomen: LLQ tender to palpation, colostomy in place Extremities: No edema in the lower extremities Neurologic: Cranial nerves II through XII grossly intact, no focal deficit noted   Status is: Inpatient  Dispo: The patient is from: Home              Anticipated d/c is to: Home              Anticipated d/c date is: 07/02/2021              Patient currently not stable for discharge  Barrier to discharge-acute kidney injury  COVID-19 Labs  No results for input(s): DDIMER, FERRITIN, LDH, CRP in the last 72 hours.  Lab Results  Component Value Date   [place] (A) 06/22/2021   [place] NEGATIVE 03/28/2021   [place] NEGATIVE 06/05/2019   [place] NEGATIVE 05/18/2019    Microbiology  Recent Results (from the past 240 hour(s))  Resp Panel by RT-PCR (Flu A&B, Covid) Nasopharyngeal Swab     Status: Abnormal   Collection Time: 06/22/21  2:48 PM   Specimen: Nasopharyngeal Swab; Nasopharyngeal(NP) swabs in vial transport medium  Result Value Ref Range Status   SARS Coronavirus 2 by RT PCR POSITIVE (A) NEGATIVE Final    Comment: RESULT CALLED TO, READ BACK BY AND VERIFIED WITH: Harlene Ramus, RN 06/22/21 1617 KDS (NOTE) SARS-CoV-2 target nucleic acids are DETECTED.  The SARS-CoV-2 RNA is generally detectable in upper respiratory specimens during the acute phase of infection. Positive results are indicative  of the presence of the identified virus, but do not rule out bacterial infection or co-infection with other pathogens not detected by the test. Clinical correlation with patient history and other diagnostic information is necessary to determine patient infection status. The expected result is Negative.  Fact Sheet for Patients: EntrepreneurPulse.com.au  Fact Sheet for Healthcare Providers: IncredibleEmployment.be  This test is not yet approved or cleared by the [place] FDA and  has been authorized for detection and/or diagnosis of SARS-CoV-2 by FDA under an Emergency Use Authorization (EUA).  This EUA will remain in effect (meaning this test can be  used) for the duration of  the COVID-19 declaration under Section 564(b)(1) of the Act, 21 U.S.C. section 360bbb-3(b)(1), unless the authorization is terminated or revoked sooner.     Influenza A by PCR NEGATIVE NEGATIVE Final   Influenza B by PCR NEGATIVE NEGATIVE Final    Comment: (NOTE) The Xpert Xpress SARS-CoV-2/FLU/RSV plus assay is intended as an aid in the diagnosis of influenza from Nasopharyngeal swab specimens and should not be used as a sole basis for treatment. Nasal washings and aspirates are unacceptable for Xpert Xpress SARS-CoV-2/FLU/RSV testing.  Fact Sheet for Patients: EntrepreneurPulse.com.au  Fact Sheet for Healthcare Providers: IncredibleEmployment.be  This test is not yet approved or cleared by the [place]  FDA and has been authorized for detection and/or diagnosis of SARS-CoV-2 by FDA under an Emergency Use Authorization (EUA). This EUA will remain in effect (meaning this test can be used) for the duration of the COVID-19 declaration under Section 564(b)(1) of the Act, 21 U.S.C. section 360bbb-3(b)(1), unless the authorization is terminated or revoked.  Performed at [facility], [place] [address] [place], [place] [address]   MRSA Next Gen by PCR, Nasal     Status: None   Collection Time: 06/23/21 12:38 AM   Specimen: Nasal Mucosa; Nasal Swab  Result Value Ref Range Status   MRSA by PCR Next Gen NOT DETECTED NOT DETECTED Final    Comment: (NOTE) The GeneXpert MRSA Assay (FDA approved for NASAL specimens only), is one component of a comprehensive MRSA colonization surveillance program. It is not intended to diagnose MRSA infection nor to guide or monitor treatment for MRSA infections. Test performance is not FDA approved in patients less than 37 years old. 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 [place] assessment 06/06/19, this is an old surgical wound that did not heal based on location  (Active)  06/06/19 0500  Location: Sacrum  Location Orientation: Posterior  Staging: Stage IV - Full thickness tissue loss with exposed bone, tendon or muscle.  Wound Description (Comments): WOC assessment 06/06/19, this is an old surgical wound that did not heal based on location   Present on Admission: Yes     Pressure Injury Perineum Deep Tissue Injury - Purple or maroon localized area of discolored intact skin or blood-filled blister due to damage of underlying soft tissue from pressure and/or shear. Bordering stage IV pressure injury (Active)     Location: Perineum  Location Orientation:   Staging: Deep Tissue Injury - Purple or maroon localized area of discolored intact skin or blood-filled blister due to damage of underlying soft tissue from pressure and/or shear.  Wound Description (Comments): Bordering stage IV pressure injury  Present on Admission: Yes     Pressure Injury 03/29/21 Coccyx Mid;Upper Unstageable - Full thickness tissue loss in which the base of the injury is covered by slough (yellow, tan, gray, green or  brown) and/or eschar (tan, brown or black) in the wound bed. previously healed wound (Active)  03/29/21 1915  Location: Coccyx  Location Orientation: Mid;Upper  Staging: Unstageable - Full thickness tissue loss in which the base of the injury is covered by slough (yellow, tan, gray, green or brown) and/or eschar (tan, brown or black) in the wound bed.  Wound Description (Comments): previously healed wound  Present on Admission: Yes     Pressure Injury 06/23/21 Coccyx Lower;Medial Unstageable - Full thickness tissue loss in which the base of the injury is covered by slough (yellow, tan, gray, green or brown) and/or eschar (tan, brown or black) in the wound bed. (Active)  06/23/21 0100  Location: Coccyx  Location Orientation: Lower;Medial  Staging: Unstageable - Full thickness tissue loss in which the base of the injury is covered by slough (yellow, tan, gray, green or brown) and/or eschar (tan, brown or black) in the wound bed.  Wound Description (Comments):   Present on Admission: Yes          [place]   [facility] If 7PM-7AM, please contact night-coverage at [email or web address], Office  [phone]   06/29/2021, 5:22  PM  LOS: 6 days

## 2021-06-30 DIAGNOSIS — F10231 Alcohol dependence with withdrawal delirium: Secondary | ICD-10-CM | POA: Diagnosis not present

## 2021-06-30 DIAGNOSIS — I10 Essential (primary) hypertension: Secondary | ICD-10-CM | POA: Diagnosis not present

## 2021-06-30 DIAGNOSIS — N182 Chronic kidney disease, stage 2 (mild): Secondary | ICD-10-CM | POA: Diagnosis not present

## 2021-06-30 DIAGNOSIS — U071 COVID-19: Secondary | ICD-10-CM | POA: Diagnosis not present

## 2021-06-30 LAB — BASIC METABOLIC PANEL
Anion gap: 4 — ABNORMAL LOW (ref 5–15)
BUN: 72 mg/dL — ABNORMAL HIGH (ref 6–20)
CO2: 27 mmol/L (ref 22–32)
Calcium: 9.2 mg/dL (ref 8.9–10.3)
Chloride: 107 mmol/L (ref 98–111)
Creatinine, Ser: 1.28 mg/dL — ABNORMAL HIGH (ref 0.44–1.00)
GFR, Estimated: 50 mL/min — ABNORMAL LOW (ref >60)
Glucose, Bld: 70 mg/dL (ref 70–99)
Potassium: 4.4 mmol/L (ref 3.5–5.1)
Sodium: 138 mmol/L (ref 135–145)

## 2021-06-30 MED ORDER — THIAMINE HCL 100 MG PO TABS: 100.0000 mg | ORAL_TABLET | Freq: Every day | ORAL | 0 refills | Status: DC

## 2021-06-30 MED ORDER — OXYCODONE HCL 5 MG PO TABS: 5.0000 mg | ORAL_TABLET | Freq: Three times a day (TID) | ORAL | 0 refills | Status: AC | PRN

## 2021-06-30 MED ORDER — AMLODIPINE BESYLATE 10 MG PO TABS: 10.0000 mg | ORAL_TABLET | Freq: Every day | ORAL | 3 refills | Status: DC

## 2021-06-30 NOTE — Discharge Summary (Signed)
Physician Discharge Summary  Sherry Baker [id number] DOB: Oct 19, 1966 DOA: 06/22/2021  PCP: Default, Provider, MD  Admit date: 06/22/2021 Discharge date: 06/30/2021  Time spent: 60 minutes  Recommendations for Outpatient Follow-up:  Follow up PCP in 2 weeks   Discharge Diagnoses:  Principal Problem:   Delirium tremens ([place]) Active Problems:   Substance abuse ([place])   Severe malnutrition ([place])   Tobacco abuse   Cocaine abuse ([place])   CKD stage II with proteinuria   Normochromic normocytic anemia   Homelessness   Toxic encephalopathy   Alcoholic cirrhosis of liver without ascites ([place])   COVID-19 virus infection   Underweight   Protein-calorie malnutrition, severe   Discharge Condition: stable  Diet recommendation: heart healthy diet  Filed Weights   06/23/21 0100 06/25/21 0400  Weight: 44.9 kg 43.9 kg    History of present illness:  55 year old female with a history of alcohol abuse, cocaine abuse, hypertension, CKD stage III, GERD, bipolar 1, depression, tobacco abuse presented from [place] reporting bilateral leg pain.  She was found to be COVID-positive in the ED. Patient admitted with alcohol withdrawal delirium, altered mental status  Hospital Course:   Alcohol withdrawal syndrome -Resolved -Patient was started on Precedex on 8/23, weaned off as of 8/25 -Continue thiamine   Abdominal pain -Patient complains of left lower quadrant abdominal pain, around colostomy site -She has a history of rectal cancer many years ago -Has colostomy bag for past 8 years CBC done today shows WBC 5.0 -Continue  gabapentin, she takes at home and will prescribe oxycodone 5 mg po q 8 h prn   Toxic encephalopathy -Multifactorial from alcohol withdrawal syndrome, crack cocaine use -Resolved -  Hypertension - patient has clonidine 0.1 mg p.o. 3 times daily on her med list -Patient said that she is not taking this medication at home, medication was last filled on April 15, 2021 -We will discontinue clonidine -Continue amlodipine 10 mg p.o. daily   COVID-19 infection -Asymptomatic; no hypoxemia -Chest x-ray showed no acute disease -CRP 0.6, D-dimer 0.42 -Patient completed 5 days of Paxlovid - will need 3 more days of isolation for covid till [id number]   Alcoholic cirrhosis of liver without ascites -LFTs are stable, follow-up GI as outpatient   Acute kidney injury -creatinine up to 1.67, likely poor p.o. intake/dehydration.   - started on NS @ 100 ml/hr, today cr has improved to 1.28.    Procedures:   Consultations:   Discharge Exam: Vitals:   06/30/21 0600 06/30/21 0800  BP: (!) 146/80 (!) 147/76  Pulse: 90 74  Resp: (!) 23 13  Temp:    SpO2: (!) 80% 96%    General: appear in no acute distress Cardiovascular: S1s2 rrr Respiratory: clear bilaterally  Discharge Instructions   Discharge Instructions     Diet - low sodium heart healthy   Complete by: As directed    Discharge instructions   Complete by: As directed    Continue isolation for covid 19 till 07/02/21   Increase activity slowly   Complete by: As directed    No wound care   Complete by: As directed       Allergies as of 06/30/2021       Reactions   Penicillins Hives, Swelling   Did it involve swelling of the face/tongue/throat, SOB, or low BP? Yes Did it involve sudden or severe rash/hives, skin peeling, or any reaction on the inside of your mouth or nose? No Did you need to seek medical attention  at a hospital or doctor's office? No When did it last happen?  <10 years   If all above answers are "NO", may proceed with cephalosporin use.   Penicillins Hives, Swelling   Has patient had a PCN reaction causing immediate rash, facial/tongue/throat swelling, SOB or lightheadedness with hypotension: Yes Has patient had a PCN reaction causing severe rash involving mucus membranes or skin necrosis: No Has patient had a PCN reaction that required hospitalization No Has  patient had a PCN reaction occurring within the last 10 years: Yes If all of the above answers are "NO", then may proceed with Cephalosporin use.        Medication List     TAKE these medications    amLODipine 10 MG tablet Commonly known as: NORVASC Take 1 tablet (10 mg total) by mouth daily.   busPIRone 15 MG tablet Commonly known as: BUSPAR Take 1 tablet (15 mg total) by mouth 2 (two) times daily.   citalopram 10 MG tablet Commonly known as: CELEXA Take 1 tablet (10 mg total) by mouth daily.   cloNIDine 0.1 MG tablet Commonly known as: CATAPRES Take 1 tablet (0.1 mg total) by mouth 3 (three) times daily.   gabapentin 100 MG capsule Commonly known as: NEURONTIN Take 1 capsule (100 mg total) by mouth 3 (three) times daily.   hydrOXYzine 10 MG tablet Commonly known as: ATARAX/VISTARIL Take 1 tablet (10 mg total) by mouth 3 (three) times daily as needed. As needed for acute anxiety   multivitamin with minerals Tabs tablet Place 1 tablet into feeding tube daily. What changed: how to take this   omeprazole 40 MG capsule Commonly known as: PRILOSEC Take 1 capsule (40 mg total) by mouth daily.   oxyCODONE 5 MG immediate release tablet Commonly known as: Roxicodone Take 1 tablet (5 mg total) by mouth every 8 (eight) hours as needed for up to 7 days.   thiamine 100 MG tablet Take 1 tablet (100 mg total) by mouth daily. Start taking on: July 01, 2021       Allergies  Allergen Reactions   Penicillins Hives and Swelling    Did it involve swelling of the face/tongue/throat, SOB, or low BP? Yes Did it involve sudden or severe rash/hives, skin peeling, or any reaction on the inside of your mouth or nose? No Did you need to seek medical attention at a hospital or doctor's office? No When did it last happen?  <10 years   If all above answers are "NO", may proceed with cephalosporin use.    Penicillins Hives and Swelling    Has patient had a PCN reaction causing  immediate rash, facial/tongue/throat swelling, SOB or lightheadedness with hypotension: Yes Has patient had a PCN reaction causing severe rash involving mucus membranes or skin necrosis: No Has patient had a PCN reaction that required hospitalization No Has patient had a PCN reaction occurring within the last 10 years: Yes If all of the above answers are "NO", then may proceed with Cephalosporin use.       The results of significant diagnostics from this hospitalization (including imaging, microbiology, ancillary and laboratory) are listed below for reference.    Significant Diagnostic Studies: DG Chest 2 View  Result Date: 06/22/2021 CLINICAL DATA:  Leg pain.  Generalized weakness.  ETOH. EXAM: CHEST - 2 VIEW COMPARISON:  04/03/2021 FINDINGS: The heart size and mediastinal contours are within normal limits. Both lungs are clear. The visualized skeletal structures are unremarkable. IMPRESSION: No active cardiopulmonary disease. Electronically Signed  By: Nolon Nations M.D.   On: 06/22/2021 13:46   CT HEAD WO CONTRAST (5MM)  Result Date: 06/23/2021 CLINICAL DATA:  Head trauma.  Altered mental status. EXAM: CT HEAD WITHOUT CONTRAST TECHNIQUE: Contiguous axial images were obtained from the base of the skull through the vertex without intravenous contrast. COMPARISON:  03/28/2021 FINDINGS: Brain: Mild ventricular dilatation likely due to central atrophy. Mild sulcal atrophy. No mass effect or midline shift. No abnormal extra-axial fluid collections. Gray-white matter junctions are distinct. Basal cisterns are not effaced. No acute intracranial hemorrhage. Vascular: Moderate intracranial arterial vascular calcifications. Skull: Calvarium appears intact. Sinuses/Orbits: Mucosal thickening in the paranasal sinuses with opacification of the right maxillary antrum. Partial left mastoid effusion. Other: Motion artifact limits evaluation. IMPRESSION: No acute intracranial abnormalities. Mild chronic  atrophy. Inflammatory changes in the paranasal sinuses. Electronically Signed   By: Lucienne Capers M.D.   On: 06/23/2021 00:14    Microbiology: Recent Results (from the past 240 hour(s))  Resp Panel by RT-PCR (Flu A&B, Covid) Nasopharyngeal Swab     Status: Abnormal   Collection Time: 06/22/21  2:48 PM   Specimen: Nasopharyngeal Swab; Nasopharyngeal(NP) swabs in vial transport medium  Result Value Ref Range Status   SARS Coronavirus 2 by RT PCR POSITIVE (A) NEGATIVE Final    Comment: RESULT CALLED TO, READ BACK BY AND VERIFIED WITH: Harlene Ramus, RN 06/22/21 1617 KDS (NOTE) SARS-CoV-2 target nucleic acids are DETECTED.  The SARS-CoV-2 RNA is generally detectable in upper respiratory specimens during the acute phase of infection. Positive results are indicative of the presence of the identified virus, but do not rule out bacterial infection or co-infection with other pathogens not detected by the test. Clinical correlation with patient history and other diagnostic information is necessary to determine patient infection status. The expected result is Negative.  Fact Sheet for Patients: EntrepreneurPulse.com.au  Fact Sheet for Healthcare Providers: IncredibleEmployment.be  This test is not yet approved or cleared by the [place] FDA and  has been authorized for detection and/or diagnosis of SARS-CoV-2 by FDA under an Emergency Use Authorization (EUA).  This EUA will remain in effect (meaning this test can be  used) for the duration of  the COVID-19 declaration under Section 564(b)(1) of the Act, 21 U.S.C. section 360bbb-3(b)(1), unless the authorization is terminated or revoked sooner.     Influenza A by PCR NEGATIVE NEGATIVE Final   Influenza B by PCR NEGATIVE NEGATIVE Final    Comment: (NOTE) The Xpert Xpress SARS-CoV-2/FLU/RSV plus assay is intended as an aid in the diagnosis of influenza from Nasopharyngeal swab specimens and should  not be used as a sole basis for treatment. Nasal washings and aspirates are unacceptable for Xpert Xpress SARS-CoV-2/FLU/RSV testing.  Fact Sheet for Patients: EntrepreneurPulse.com.au  Fact Sheet for Healthcare Providers: IncredibleEmployment.be  This test is not yet approved or cleared by the [place] FDA and has been authorized for detection and/or diagnosis of SARS-CoV-2 by FDA under an Emergency Use Authorization (EUA). This EUA will remain in effect (meaning this test can be used) for the duration of the COVID-19 declaration under Section 564(b)(1) of the Act, 21 U.S.C. section 360bbb-3(b)(1), unless the authorization is terminated or revoked.  Performed at [facility], [place] [address] [place], [place] [address]   MRSA Next Gen by PCR, Nasal     Status: None   Collection Time: 06/23/21 12:38 AM   Specimen: Nasal Mucosa; Nasal Swab  Result Value Ref Range Status   MRSA by PCR  Next Gen NOT DETECTED NOT DETECTED Final    Comment: (NOTE) The GeneXpert MRSA Assay (FDA approved for NASAL specimens only), is one component of a comprehensive MRSA colonization surveillance program. It is not intended to diagnose MRSA infection nor to guide or monitor treatment for MRSA infections. Test performance is not FDA approved in patients less than 34 years old. Performed at [facility], [place] [address] [place], [place] [address]      Labs: Basic Metabolic Panel: Recent Labs  Lab 06/24/21 0309 06/25/21 0306 06/26/21 0326 06/27/21 0301 06/28/21 0311 06/29/21 0317 06/30/21 0312  NA 136 135 139 139 136 134* 138  K 3.1* 4.1 3.6 4.8 4.5 4.3 4.4  CL 106 106 109 112* 107 104 107  CO2 21* '24 22 23 24 23 27  '$ GLUCOSE 73 88 119* 106* 99 95 70  BUN '14 9 9 '$ 22* 40* 67* 72*  CREATININE 1.10* 0.94 1.02* 1.25* 1.34* 1.67* 1.28*  CALCIUM 8.7* 9.0 8.8* 8.7* 9.0 9.0 9.2  MG 1.7 2.1 2.0 1.9 1.8  --   --   PHOS 2.6  2.2* 3.8 3.0 2.8  --   --    Liver Function Tests: Recent Labs  Lab 06/24/21 0309 06/28/21 0311  AST 26 15  ALT 20 14  ALKPHOS 63 71  BILITOT 0.5 0.5  PROT 6.3* 6.5  ALBUMIN 2.7* 2.7*   Recent Labs  Lab 06/24/21 0309  LIPASE 29   No results for input(s): AMMONIA in the last 168 hours. CBC: Recent Labs  Lab 06/24/21 0309 06/27/21 0301 06/29/21 0951  WBC 3.7* 4.7 5.0  HGB 11.8* 12.3 10.9*  HCT 36.8 37.9 34.3*  MCV 89.1 89.2 90.0  PLT 154 176 177   Cardiac Enzymes: No results for input(s): CKTOTAL, CKMB, CKMBINDEX, TROPONINI in the last 168 hours. BNP: BNP (last 3 results) Recent Labs    03/30/21 0153 06/23/21 0100  BNP 231.8* 485.8*    ProBNP (last 3 results) No results for input(s): PROBNP in the last 8760 hours.  CBG: Recent Labs  Lab 06/26/21 1705 06/26/21 1941 06/26/21 2348 06/27/21 0300 06/27/21 0817  GLUCAP 147* 92 127* 98 108*       Signed:  Oswald Hillock MD.  [facility] 06/30/2021, 10:02 AM

## 2021-06-30 NOTE — Progress Notes (Signed)
Physical Therapy Treatment Patient Details Name: Sherry Baker MRN: [id number] DOB: [birth year] Today's Date: 06/30/2021    History of Present Illness 55 year old [place] with c/o LE pain and admitting to ETOH use. Has been through DT's while in hospital. PMH:  polysubstance abuse, alcohol abuse, cocaine abuse, bipolar disease, arthritis, rectal colon cancer status post colostomy, cirrhosis.    PT Comments    Pt in good spirits and with marked improvement in activity tolerance, strength, safety awareness and stability.  Pt should progress to dc home with intermittent assist of family/friends.  Follow Up Recommendations  Home health PT     Equipment Recommendations  None recommended by PT    Recommendations for Other Services       Precautions / Restrictions Precautions Precautions: Fall Restrictions Weight Bearing Restrictions: No    Mobility  Bed Mobility Overal bed mobility: Modified Independent                  Transfers Overall transfer level: Modified independent               General transfer comment: no physical assist, cues for safety  Ambulation/Gait Ambulation/Gait assistance: Supervision;Modified independent (Device/Increase time) Gait Distance (Feet): 160 Feet Assistive device: Rolling walker (2 wheeled) Gait Pattern/deviations: Step-through pattern;Decreased step length - right;Decreased step length - left;Shuffle;Trunk flexed     General Gait Details: min cues for posture and positon from [employer] Mobility    Modified Rankin (Stroke Patients Only)       Balance Overall balance assessment: Needs assistance Sitting-balance support: Feet supported Sitting balance-Leahy Scale: Good     Standing balance support: No upper extremity supported;During functional activity Standing balance-Leahy Scale: Fair                              Cognition Arousal/Alertness:  Awake/alert Behavior During Therapy: WFL for tasks assessed/performed Overall Cognitive Status: Within Functional Limits for tasks assessed                                        Exercises      General Comments        Pertinent Vitals/Pain Pain Assessment: No/denies pain    Home Living                      Prior Function            PT Goals (current goals can now be found in the care plan section) Acute Rehab PT Goals Patient Stated Goal: get better PT Goal Formulation: With patient Time For Goal Achievement: 07/17/21 Potential to Achieve Goals: Good Progress towards PT goals: Progressing toward goals    Frequency    Min 3X/week      PT Plan Discharge plan needs to be updated    Co-evaluation              AM-PAC PT "6 Clicks" Mobility   Outcome Measure  Help needed turning from your back to your side while in a flat bed without using bedrails?: None Help needed moving from lying on your back to sitting on the side of a flat bed without using bedrails?: None Help needed moving to and from a bed to a chair (  including a wheelchair)?: A Little Help needed standing up from a chair using your arms (e.g., wheelchair or bedside chair)?: None Help needed to walk in hospital room?: A Little Help needed climbing 3-5 steps with a railing? : A Little 6 Click Score: 21    End of Session Equipment Utilized During Treatment: Gait belt Activity Tolerance: Patient tolerated treatment well Patient left: in chair;with call bell/phone within reach;with chair alarm set Nurse Communication: Mobility status PT Visit Diagnosis: Difficulty in walking, not elsewhere classified (R26.2);Muscle weakness (generalized) (M62.81);Unsteadiness on feet (R26.81);History of falling (Z91.81);Pain     Time: 1044-1100 PT Time Calculation (min) (ACUTE ONLY): 16 min  Charges:  $Gait Training: 8-22 mins                     [place] Pager [phone] Office [phone]    Gloriana Piltz 06/30/2021, 12:11 PM

## 2021-07-01 ENCOUNTER — Telehealth: Payer: Self-pay | Admitting: *Deleted

## 2021-07-01 NOTE — Telephone Encounter (Signed)
Has been discharged to home on 06/30/21. Still no answer on mobile #. Reached a female on [phone] and he confirmed address and that she still lives there several days/week. Mailed her next appointment to address on file with note of new office location and phone #.

## 2021-07-11 ENCOUNTER — Inpatient Hospital Stay: Payer: Medicaid Other | Admitting: Nurse Practitioner

## 2021-07-16 ENCOUNTER — Ambulatory Visit: Payer: Medicaid Other | Admitting: Nurse Practitioner

## 2021-09-05 ENCOUNTER — Inpatient Hospital Stay (HOSPITAL_COMMUNITY)
Admission: EM | Admit: 2021-09-05 | Discharge: 2021-09-30 | DRG: 896 | Disposition: A | Discharge status: Home / Self Care | Payer: Medicaid Other | Attending: Internal Medicine | Admitting: Internal Medicine

## 2021-09-05 ENCOUNTER — Emergency Department (HOSPITAL_COMMUNITY): Payer: Medicaid Other

## 2021-09-05 ENCOUNTER — Inpatient Hospital Stay (HOSPITAL_COMMUNITY): Payer: Medicaid Other

## 2021-09-05 ENCOUNTER — Encounter (HOSPITAL_COMMUNITY): Payer: Self-pay | Admitting: Pharmacy Technician

## 2021-09-05 DIAGNOSIS — Z01818 Encounter for other preprocedural examination: Secondary | ICD-10-CM

## 2021-09-05 DIAGNOSIS — Z85048 Personal history of other malignant neoplasm of rectum, rectosigmoid junction, and anus: Secondary | ICD-10-CM | POA: Diagnosis not present

## 2021-09-05 DIAGNOSIS — F1410 Cocaine abuse, uncomplicated: Secondary | ICD-10-CM | POA: Diagnosis present

## 2021-09-05 DIAGNOSIS — F419 Anxiety disorder, unspecified: Secondary | ICD-10-CM | POA: Diagnosis present

## 2021-09-05 DIAGNOSIS — X58XXXA Exposure to other specified factors, initial encounter: Secondary | ICD-10-CM | POA: Diagnosis present

## 2021-09-05 DIAGNOSIS — I5032 Chronic diastolic (congestive) heart failure: Secondary | ICD-10-CM | POA: Diagnosis present

## 2021-09-05 DIAGNOSIS — B953 Streptococcus pneumoniae as the cause of diseases classified elsewhere: Secondary | ICD-10-CM | POA: Diagnosis present

## 2021-09-05 DIAGNOSIS — N1831 Chronic kidney disease, stage 3a: Secondary | ICD-10-CM | POA: Diagnosis present

## 2021-09-05 DIAGNOSIS — R269 Unspecified abnormalities of gait and mobility: Secondary | ICD-10-CM | POA: Diagnosis present

## 2021-09-05 DIAGNOSIS — R778 Other specified abnormalities of plasma proteins: Secondary | ICD-10-CM | POA: Diagnosis not present

## 2021-09-05 DIAGNOSIS — E8721 Acute metabolic acidosis: Secondary | ICD-10-CM | POA: Diagnosis present

## 2021-09-05 DIAGNOSIS — Z681 Body mass index (BMI) 19 or less, adult: Secondary | ICD-10-CM

## 2021-09-05 DIAGNOSIS — F10231 Alcohol dependence with withdrawal delirium: Principal | ICD-10-CM | POA: Diagnosis present

## 2021-09-05 DIAGNOSIS — J690 Pneumonitis due to inhalation of food and vomit: Secondary | ICD-10-CM | POA: Diagnosis present

## 2021-09-05 DIAGNOSIS — I248 Other forms of acute ischemic heart disease: Secondary | ICD-10-CM | POA: Diagnosis present

## 2021-09-05 DIAGNOSIS — F10229 Alcohol dependence with intoxication, unspecified: Secondary | ICD-10-CM | POA: Diagnosis present

## 2021-09-05 DIAGNOSIS — F10921 Alcohol use, unspecified with intoxication delirium: Secondary | ICD-10-CM

## 2021-09-05 DIAGNOSIS — J9690 Respiratory failure, unspecified, unspecified whether with hypoxia or hypercapnia: Secondary | ICD-10-CM

## 2021-09-05 DIAGNOSIS — C20 Malignant neoplasm of rectum: Secondary | ICD-10-CM | POA: Diagnosis present

## 2021-09-05 DIAGNOSIS — Z5900 Homelessness unspecified: Secondary | ICD-10-CM | POA: Diagnosis not present

## 2021-09-05 DIAGNOSIS — S51812A Laceration without foreign body of left forearm, initial encounter: Secondary | ICD-10-CM | POA: Diagnosis present

## 2021-09-05 DIAGNOSIS — Z0189 Encounter for other specified special examinations: Secondary | ICD-10-CM

## 2021-09-05 DIAGNOSIS — K7460 Unspecified cirrhosis of liver: Secondary | ICD-10-CM | POA: Diagnosis present

## 2021-09-05 DIAGNOSIS — G9341 Metabolic encephalopathy: Secondary | ICD-10-CM | POA: Diagnosis not present

## 2021-09-05 DIAGNOSIS — E43 Unspecified severe protein-calorie malnutrition: Secondary | ICD-10-CM | POA: Diagnosis present

## 2021-09-05 DIAGNOSIS — F319 Bipolar disorder, unspecified: Secondary | ICD-10-CM | POA: Diagnosis present

## 2021-09-05 DIAGNOSIS — Z8659 Personal history of other mental and behavioral disorders: Secondary | ICD-10-CM | POA: Diagnosis not present

## 2021-09-05 DIAGNOSIS — Z79899 Other long term (current) drug therapy: Secondary | ICD-10-CM

## 2021-09-05 DIAGNOSIS — L8990 Pressure ulcer of unspecified site, unspecified stage: Secondary | ICD-10-CM | POA: Diagnosis present

## 2021-09-05 DIAGNOSIS — M7989 Other specified soft tissue disorders: Secondary | ICD-10-CM | POA: Diagnosis not present

## 2021-09-05 DIAGNOSIS — I82611 Acute embolism and thrombosis of superficial veins of right upper extremity: Secondary | ICD-10-CM | POA: Diagnosis present

## 2021-09-05 DIAGNOSIS — I809 Phlebitis and thrombophlebitis of unspecified site: Secondary | ICD-10-CM | POA: Diagnosis present

## 2021-09-05 DIAGNOSIS — R7989 Other specified abnormal findings of blood chemistry: Secondary | ICD-10-CM | POA: Diagnosis present

## 2021-09-05 DIAGNOSIS — K219 Gastro-esophageal reflux disease without esophagitis: Secondary | ICD-10-CM | POA: Diagnosis present

## 2021-09-05 DIAGNOSIS — J9601 Acute respiratory failure with hypoxia: Secondary | ICD-10-CM | POA: Diagnosis present

## 2021-09-05 DIAGNOSIS — A491 Streptococcal infection, unspecified site: Secondary | ICD-10-CM | POA: Diagnosis not present

## 2021-09-05 DIAGNOSIS — R9431 Abnormal electrocardiogram [ECG] [EKG]: Secondary | ICD-10-CM | POA: Diagnosis not present

## 2021-09-05 DIAGNOSIS — Z4659 Encounter for fitting and adjustment of other gastrointestinal appliance and device: Secondary | ICD-10-CM

## 2021-09-05 DIAGNOSIS — F10931 Alcohol use, unspecified with withdrawal delirium: Secondary | ICD-10-CM | POA: Diagnosis not present

## 2021-09-05 DIAGNOSIS — Z933 Colostomy status: Secondary | ICD-10-CM

## 2021-09-05 DIAGNOSIS — F10939 Alcohol use, unspecified with withdrawal, unspecified: Secondary | ICD-10-CM | POA: Diagnosis not present

## 2021-09-05 DIAGNOSIS — N179 Acute kidney failure, unspecified: Secondary | ICD-10-CM | POA: Diagnosis present

## 2021-09-05 DIAGNOSIS — F32A Depression, unspecified: Secondary | ICD-10-CM | POA: Diagnosis present

## 2021-09-05 DIAGNOSIS — Z781 Physical restraint status: Secondary | ICD-10-CM

## 2021-09-05 DIAGNOSIS — I10 Essential (primary) hypertension: Secondary | ICD-10-CM | POA: Diagnosis not present

## 2021-09-05 DIAGNOSIS — E559 Vitamin D deficiency, unspecified: Secondary | ICD-10-CM | POA: Diagnosis present

## 2021-09-05 DIAGNOSIS — Z720 Tobacco use: Secondary | ICD-10-CM | POA: Diagnosis present

## 2021-09-05 DIAGNOSIS — R262 Difficulty in walking, not elsewhere classified: Secondary | ICD-10-CM

## 2021-09-05 DIAGNOSIS — Z23 Encounter for immunization: Secondary | ICD-10-CM

## 2021-09-05 DIAGNOSIS — I130 Hypertensive heart and chronic kidney disease with heart failure and stage 1 through stage 4 chronic kidney disease, or unspecified chronic kidney disease: Secondary | ICD-10-CM | POA: Diagnosis present

## 2021-09-05 DIAGNOSIS — F17210 Nicotine dependence, cigarettes, uncomplicated: Secondary | ICD-10-CM | POA: Diagnosis present

## 2021-09-05 DIAGNOSIS — R4182 Altered mental status, unspecified: Secondary | ICD-10-CM | POA: Diagnosis present

## 2021-09-05 DIAGNOSIS — R5381 Other malaise: Secondary | ICD-10-CM

## 2021-09-05 DIAGNOSIS — F14129 Cocaine abuse with intoxication, unspecified: Secondary | ICD-10-CM | POA: Diagnosis present

## 2021-09-05 DIAGNOSIS — R1310 Dysphagia, unspecified: Secondary | ICD-10-CM | POA: Diagnosis present

## 2021-09-05 DIAGNOSIS — Z880 Allergy status to penicillin: Secondary | ICD-10-CM

## 2021-09-05 DIAGNOSIS — F1010 Alcohol abuse, uncomplicated: Secondary | ICD-10-CM | POA: Diagnosis present

## 2021-09-05 DIAGNOSIS — J9602 Acute respiratory failure with hypercapnia: Secondary | ICD-10-CM | POA: Diagnosis present

## 2021-09-05 DIAGNOSIS — E876 Hypokalemia: Secondary | ICD-10-CM | POA: Diagnosis present

## 2021-09-05 DIAGNOSIS — D696 Thrombocytopenia, unspecified: Secondary | ICD-10-CM | POA: Diagnosis present

## 2021-09-05 DIAGNOSIS — G928 Other toxic encephalopathy: Secondary | ICD-10-CM | POA: Diagnosis present

## 2021-09-05 DIAGNOSIS — L89153 Pressure ulcer of sacral region, stage 3: Secondary | ICD-10-CM | POA: Diagnosis present

## 2021-09-05 DIAGNOSIS — F17200 Nicotine dependence, unspecified, uncomplicated: Secondary | ICD-10-CM | POA: Diagnosis not present

## 2021-09-05 DIAGNOSIS — Z5320 Procedure and treatment not carried out because of patient's decision for unspecified reasons: Secondary | ICD-10-CM | POA: Diagnosis present

## 2021-09-05 DIAGNOSIS — I959 Hypotension, unspecified: Secondary | ICD-10-CM | POA: Diagnosis not present

## 2021-09-05 DIAGNOSIS — Z20822 Contact with and (suspected) exposure to covid-19: Secondary | ICD-10-CM | POA: Diagnosis present

## 2021-09-05 DIAGNOSIS — E538 Deficiency of other specified B group vitamins: Secondary | ICD-10-CM | POA: Diagnosis present

## 2021-09-05 DIAGNOSIS — Z5901 Sheltered homelessness: Secondary | ICD-10-CM

## 2021-09-05 DIAGNOSIS — R1312 Dysphagia, oropharyngeal phase: Secondary | ICD-10-CM | POA: Diagnosis not present

## 2021-09-05 DIAGNOSIS — F1910 Other psychoactive substance abuse, uncomplicated: Secondary | ICD-10-CM | POA: Diagnosis present

## 2021-09-05 DIAGNOSIS — I160 Hypertensive urgency: Secondary | ICD-10-CM | POA: Diagnosis present

## 2021-09-05 LAB — COMPREHENSIVE METABOLIC PANEL
ALT: 20 U/L (ref 0–44)
AST: 32 U/L (ref 15–41)
Albumin: 3 g/dL — ABNORMAL LOW (ref 3.5–5.0)
Alkaline Phosphatase: 81 U/L (ref 38–126)
Anion gap: 12 (ref 5–15)
BUN: 23 mg/dL — ABNORMAL HIGH (ref 6–20)
CO2: 16 mmol/L — ABNORMAL LOW (ref 22–32)
Calcium: 8.6 mg/dL — ABNORMAL LOW (ref 8.9–10.3)
Chloride: 106 mmol/L (ref 98–111)
Creatinine, Ser: 1.71 mg/dL — ABNORMAL HIGH (ref 0.44–1.00)
GFR, Estimated: 35 mL/min — ABNORMAL LOW (ref >60)
Glucose, Bld: 81 mg/dL (ref 70–99)
Potassium: 4 mmol/L (ref 3.5–5.1)
Sodium: 134 mmol/L — ABNORMAL LOW (ref 135–145)
Total Bilirubin: 0.2 mg/dL — ABNORMAL LOW (ref 0.3–1.2)
Total Protein: 7.3 g/dL (ref 6.5–8.1)

## 2021-09-05 LAB — CBC WITH DIFFERENTIAL/PLATELET
Abs Immature Granulocytes: 0.05 10*3/uL (ref 0.00–0.07)
Basophils Absolute: 0 10*3/uL (ref 0.0–0.1)
Basophils Relative: 0 %
Eosinophils Absolute: 0.1 10*3/uL (ref 0.0–0.5)
Eosinophils Relative: 1 %
HCT: 33.1 % — ABNORMAL LOW (ref 36.0–46.0)
Hemoglobin: 10.3 g/dL — ABNORMAL LOW (ref 12.0–15.0)
Immature Granulocytes: 1 %
Lymphocytes Relative: 8 %
Lymphs Abs: 0.5 10*3/uL — ABNORMAL LOW (ref 0.7–4.0)
MCH: 28.5 pg (ref 26.0–34.0)
MCHC: 31.1 g/dL (ref 30.0–36.0)
MCV: 91.4 fL (ref 80.0–100.0)
Monocytes Absolute: 0.6 10*3/uL (ref 0.1–1.0)
Monocytes Relative: 11 %
Neutro Abs: 4.4 10*3/uL (ref 1.7–7.7)
Neutrophils Relative %: 79 %
Platelets: 166 10*3/uL (ref 150–400)
RBC: 3.62 MIL/uL — ABNORMAL LOW (ref 3.87–5.11)
RDW: 17.8 % — ABNORMAL HIGH (ref 11.5–15.5)
WBC: 5.6 10*3/uL (ref 4.0–10.5)
nRBC: 0 % (ref 0.0–0.2)

## 2021-09-05 LAB — URINALYSIS, ROUTINE W REFLEX MICROSCOPIC
Bilirubin Urine: NEGATIVE
Glucose, UA: NEGATIVE mg/dL
Ketones, ur: NEGATIVE mg/dL
Leukocytes,Ua: NEGATIVE
Nitrite: NEGATIVE
Protein, ur: 100 mg/dL — AB
Specific Gravity, Urine: 1.005 (ref 1.005–1.030)
pH: 6 (ref 5.0–8.0)

## 2021-09-05 LAB — RAPID URINE DRUG SCREEN, HOSP PERFORMED
Amphetamines: NOT DETECTED
Barbiturates: NOT DETECTED
Benzodiazepines: NOT DETECTED
Cocaine: POSITIVE — AB
Opiates: NOT DETECTED
Tetrahydrocannabinol: NOT DETECTED

## 2021-09-05 LAB — I-STAT ARTERIAL BLOOD GAS, ED
Acid-base deficit: 8 mmol/L — ABNORMAL HIGH (ref 0.0–2.0)
Bicarbonate: 18.8 mmol/L — ABNORMAL LOW (ref 20.0–28.0)
Calcium, Ion: 1.23 mmol/L (ref 1.15–1.40)
HCT: 34 % — ABNORMAL LOW (ref 36.0–46.0)
Hemoglobin: 11.6 g/dL — ABNORMAL LOW (ref 12.0–15.0)
O2 Saturation: 100 %
Patient temperature: 97.6
Potassium: 3.5 mmol/L (ref 3.5–5.1)
Sodium: 139 mmol/L (ref 135–145)
TCO2: 20 mmol/L — ABNORMAL LOW (ref 22–32)
pCO2 arterial: 41 mmHg (ref 32.0–48.0)
pH, Arterial: 7.266 — ABNORMAL LOW (ref 7.350–7.450)
pO2, Arterial: 464 mmHg — ABNORMAL HIGH (ref 83.0–108.0)

## 2021-09-05 LAB — RESP PANEL BY RT-PCR (FLU A&B, COVID) ARPGX2
Influenza A by PCR: NEGATIVE
Influenza B by PCR: NEGATIVE
SARS Coronavirus 2 by RT PCR: NEGATIVE

## 2021-09-05 LAB — GLUCOSE, CAPILLARY: Glucose-Capillary: 73 mg/dL (ref 70–99)

## 2021-09-05 LAB — TROPONIN I (HIGH SENSITIVITY): Troponin I (High Sensitivity): 24 ng/L — ABNORMAL HIGH (ref <18)

## 2021-09-05 LAB — MAGNESIUM: Magnesium: 2 mg/dL (ref 1.7–2.4)

## 2021-09-05 LAB — AMMONIA: Ammonia: 26 umol/L (ref 9–35)

## 2021-09-05 LAB — ETHANOL: Alcohol, Ethyl (B): <10 mg/dL (ref <10)

## 2021-09-05 MED ORDER — THIAMINE HCL 100 MG/ML IJ SOLN
100.0000 mg | Freq: Every day | INTRAMUSCULAR | Status: DC
  Administered 2021-09-05 – 2021-09-09 (×5): 100 mg via INTRAVENOUS
  Filled 2021-09-05 (×5): qty 2

## 2021-09-05 MED ORDER — ETOMIDATE 2 MG/ML IV SOLN
INTRAVENOUS | Status: DC | PRN
  Administered 2021-09-05: 20 mg via INTRAVENOUS

## 2021-09-05 MED ORDER — CHLORHEXIDINE GLUCONATE 0.12% ORAL RINSE (MEDLINE KIT)
15.0000 mL | Freq: Two times a day (BID) | OROMUCOSAL | Status: DC
  Administered 2021-09-05 – 2021-09-12 (×14): 15 mL via OROMUCOSAL

## 2021-09-05 MED ORDER — STERILE WATER FOR INJECTION IJ SOLN
INTRAMUSCULAR | Status: AC
  Filled 2021-09-05: qty 10

## 2021-09-05 MED ORDER — CHLORHEXIDINE GLUCONATE CLOTH 2 % EX PADS
6.0000 | MEDICATED_PAD | Freq: Every day | CUTANEOUS | Status: DC
  Administered 2021-09-05 – 2021-09-06 (×2): 6 via TOPICAL

## 2021-09-05 MED ORDER — LORAZEPAM 1 MG PO TABS: 1.0000 mg | ORAL_TABLET | ORAL | Status: DC | PRN

## 2021-09-05 MED ORDER — FOLIC ACID 1 MG PO TABS
1.0000 mg | ORAL_TABLET | Freq: Every day | ORAL | Status: DC
Start: 2021-09-06 — End: 2021-09-20
  Administered 2021-09-06 – 2021-09-20 (×14): 1 mg
  Filled 2021-09-05 (×15): qty 1

## 2021-09-05 MED ORDER — ROCURONIUM BROMIDE 50 MG/5ML IV SOLN: 60.0000 mg | Freq: Once | INTRAVENOUS | Status: DC

## 2021-09-05 MED ORDER — HEPARIN SODIUM (PORCINE) 5000 UNIT/ML IJ SOLN
5000.0000 [IU] | Freq: Three times a day (TID) | INTRAMUSCULAR | Status: DC
  Administered 2021-09-06 – 2021-09-30 (×72): 5000 [IU] via SUBCUTANEOUS
  Filled 2021-09-05 (×70): qty 1

## 2021-09-05 MED ORDER — SODIUM CHLORIDE 0.9 % IV BOLUS
1000.0000 mL | Freq: Once | INTRAVENOUS | Status: AC
  Administered 2021-09-05: 1000 mL via INTRAVENOUS

## 2021-09-05 MED ORDER — ETOMIDATE 2 MG/ML IV SOLN: 20.0000 mg | Freq: Once | INTRAVENOUS | Status: DC

## 2021-09-05 MED ORDER — PANTOPRAZOLE SODIUM 40 MG IV SOLR
40.0000 mg | Freq: Every day | INTRAVENOUS | Status: DC
  Administered 2021-09-05 – 2021-09-08 (×4): 40 mg via INTRAVENOUS
  Filled 2021-09-05 (×4): qty 40

## 2021-09-05 MED ORDER — ZIPRASIDONE MESYLATE 20 MG IM SOLR: 20.0000 mg | Freq: Once | INTRAMUSCULAR | Status: AC

## 2021-09-05 MED ORDER — INSULIN ASPART 100 UNIT/ML IJ SOLN: 0.0000 [IU] | INTRAMUSCULAR | Status: DC

## 2021-09-05 MED ORDER — FENTANYL CITRATE PF 50 MCG/ML IJ SOSY: 50.0000 ug | PREFILLED_SYRINGE | INTRAMUSCULAR | Status: DC | PRN

## 2021-09-05 MED ORDER — LORAZEPAM 2 MG/ML IJ SOLN
1.0000 mg | Freq: Once | INTRAMUSCULAR | Status: AC
  Administered 2021-09-05: 1 mg via INTRAVENOUS
  Filled 2021-09-05: qty 1

## 2021-09-05 MED ORDER — ADULT MULTIVITAMIN LIQUID CH
15.0000 mL | Freq: Every day | ORAL | Status: DC
  Filled 2021-09-05: qty 15

## 2021-09-05 MED ORDER — PROPOFOL 1000 MG/100ML IV EMUL
0.0000 ug/kg/min | INTRAVENOUS | Status: DC
  Administered 2021-09-05: 5 ug/kg/min via INTRAVENOUS
  Filled 2021-09-05: qty 100

## 2021-09-05 MED ORDER — ROCURONIUM BROMIDE 50 MG/5ML IV SOLN
INTRAVENOUS | Status: DC | PRN
  Administered 2021-09-05: 60 mg via INTRAVENOUS

## 2021-09-05 MED ORDER — POLYETHYLENE GLYCOL 3350 17 G PO PACK: 17.0000 g | PACK | Freq: Every day | ORAL | Status: DC | PRN

## 2021-09-05 MED ORDER — POLYETHYLENE GLYCOL 3350 17 G PO PACK
17.0000 g | PACK | Freq: Every day | ORAL | Status: DC
  Administered 2021-09-05 – 2021-09-20 (×13): 17 g
  Filled 2021-09-05 (×13): qty 1

## 2021-09-05 MED ORDER — HYDRALAZINE HCL 20 MG/ML IJ SOLN
10.0000 mg | Freq: Once | INTRAMUSCULAR | Status: AC
  Administered 2021-09-05: 10 mg via INTRAVENOUS
  Filled 2021-09-05: qty 1

## 2021-09-05 MED ORDER — FENTANYL CITRATE PF 50 MCG/ML IJ SOSY
50.0000 ug | PREFILLED_SYRINGE | INTRAMUSCULAR | Status: DC | PRN
  Administered 2021-09-05: 50 ug via INTRAVENOUS

## 2021-09-05 MED ORDER — HYDRALAZINE HCL 25 MG PO TABS: 25.0000 mg | ORAL_TABLET | Freq: Three times a day (TID) | ORAL | Status: DC | PRN

## 2021-09-05 MED ORDER — ORAL CARE MOUTH RINSE
15.0000 mL | OROMUCOSAL | Status: DC
  Administered 2021-09-06 – 2021-09-12 (×66): 15 mL via OROMUCOSAL

## 2021-09-05 MED ORDER — LORAZEPAM 2 MG/ML IJ SOLN
1.0000 mg | INTRAMUSCULAR | Status: DC | PRN
  Administered 2021-09-05: 1 mg via INTRAVENOUS
  Filled 2021-09-05: qty 1

## 2021-09-05 MED ORDER — DOCUSATE SODIUM 100 MG PO CAPS: 100.0000 mg | ORAL_CAPSULE | Freq: Two times a day (BID) | ORAL | Status: DC | PRN

## 2021-09-05 MED ORDER — ZIPRASIDONE MESYLATE 20 MG IM SOLR
INTRAMUSCULAR | Status: AC
  Administered 2021-09-05: 20 mg via INTRAMUSCULAR
  Filled 2021-09-05: qty 20

## 2021-09-05 MED ORDER — DOCUSATE SODIUM 50 MG/5ML PO LIQD
100.0000 mg | Freq: Two times a day (BID) | ORAL | Status: DC
  Administered 2021-09-05 – 2021-09-20 (×24): 100 mg
  Filled 2021-09-05 (×26): qty 10

## 2021-09-05 MED ORDER — FENTANYL CITRATE PF 50 MCG/ML IJ SOSY
PREFILLED_SYRINGE | INTRAMUSCULAR | Status: AC
  Filled 2021-09-05: qty 1

## 2021-09-05 MED ORDER — PHENOBARBITAL SODIUM 130 MG/ML IJ SOLN: 130.0000 mg | INTRAMUSCULAR | Status: DC | PRN

## 2021-09-05 NOTE — ED Notes (Signed)
victoria (niece) called to add to PT file [phone]

## 2021-09-05 NOTE — H&P (Signed)
NAME:  Sherry Baker, MRN:  [id number], DOB:  02/13/1966, LOS: 0 ADMISSION DATE:  09/05/2021, CONSULTATION DATE:  09/05/21 REFERRING MD:  Dr. Langston Masker, CHIEF COMPLAINT:  AMS  History of Present Illness:  Pt is a 55 yo F w/ a hx of ETOH use disorder, complicated etoh withdrawals requiring prior intubation, Cocaine abuse, HTN, CKD3, GERD, hx of rectal cancer s/p resection w/ ostomy, Bipolar1, depression, tobacco abuse who is admitted to the hospital for cocaine intoxication and etoh withdrawal. Pt was found minimally responsive behind a dumpster surrounded by beer cans. In the ED the pt was exhibiting hypertension, tachycardia and reportedly intermittent agitation so received 20mg  of geodon and 2mg  of ativan. Pt was intermittently somnolent prior to and after the meds in the ED. PCCM consulted for evaluation and pt was found to not be able to protect her airway, subsequently was intubated and admitted.  Pertinent  Medical History  ETOH use disorder complicated etoh withdrawals requiring prior intubation Cocaine abuse HTN CKD3 GERD Bipolar 1 Depression Tobacco dependence Rectal cancer s/p resection, chemo, and s/p ostomy  Significant Hospital Events: Including procedures, antibiotic start and stop dates in addition to other pertinent events   Intubated in ED on 11/3, admitted  Interim History / Subjective:  Pt unresponsive and unable to protect airway, only slightly moving extremities to sternal rub  Objective   Blood pressure 120/78, pulse 97, temperature 98.5 F (36.9 C), temperature source Oral, resp. rate (!) 24, height 5\' 2"  (1.575 m), weight 44.8 kg, SpO2 100 %.        Intake/Output Summary (Last 24 hours) at 09/05/2021 2204 Last data filed at 09/05/2021 2158 Gross per 24 hour  Intake 0.08 ml  Output --  Net 0.08 ml   Filed Weights   09/05/21 2147  Weight: 44.8 kg   Examination: Physical Exam Vitals and nursing note reviewed.  Constitutional:      Appearance: She is  well-developed.  HENT:     Head: Normocephalic.     Nose: Rhinorrhea present.     Mouth/Throat:     Mouth: Mucous membranes are moist.     Comments: Thick secretions noted in oropharynx Cardiovascular:     Rate and Rhythm: Regular rhythm. Tachycardia present.  Pulmonary:     Effort: Pulmonary effort is normal. No tachypnea.     Breath sounds: No decreased breath sounds.  Abdominal:     Palpations: Abdomen is soft.     Comments: Ostomy present w/ brown stool  Musculoskeletal:     Cervical back: Normal range of motion.  Skin:    General: Skin is warm and dry.     Comments: Abrasion/skin tear on her L forearm  Neurological:     General: No focal deficit present.     Mental Status: She is unresponsive.  Psychiatric:     Comments: Unable to assess   Resolved Hospital Problem list   N/a  Assessment & Plan:  Pt is a 55 yo F w/ a hx of ETOH use disorder, complicated etoh withdrawals requiring prior intubation, Cocaine abuse, HTN, CKD3, GERD, hx of rectal cancer s/p resection w/ ostomy, Bipolar1, depression, tobacco abuse who is admitted to the hospital for cocaine intoxication and etoh withdrawal.  #Acute metabolic encephalopathy - likely toxic from drug ingestion and related to etoh withdrawals with DT. Not protecting airway in ED and intubated  - For now, propofol gtt and fentanyl prn  - CIWA protocol with phenobarbital, will do 130mg /hr prn for CIWA >8, if  needing multiple doses will give a full 10mg /kg load  #Congestion w/ thick secretions - COVID several months ago  - RVP  #Severe ETOH use disorder w/ complicated etoh withdrawals requiring prior intubation -   - CIWA protocol with phenobarbital, will do 130mg /hr prn for CIWA >8, if needing multiple doses will give a full 10mg /kg load  #Cocaine abuse - +UDS on admission  - supportive care  #Hypertensive urgency on chronic HTN - intermittently in the ED, improved at the time of admission.   - Cont w/ propofol for now   -  Hydralazine prn for SBP >180  #AKI CKD3 - likely prerenal  - Additional IVF, trend BMP  #Hx of GERD- prior on a ppi  - PPI for now while intubated  #Hx of rectal cancer s/p resection w/ ostomy -  - routine ostomy care   #Bipolar1 hx - received 20mg  of geodon in ED with agitation #MDD  - consider psych consult when awake  #tobacco use disorder  - when extubated will need NRT  Best Practice (right click and "Reselect all SmartList Selections" daily)   Diet/type: NPO w/ meds via tube DVT prophylaxis: prophylactic heparin  GI prophylaxis: PPI Lines: PIVs Foley:  not at present Code Status:  full code Last date of multidisciplinary goals of care discussion [unknown]  Labs   CBC: Recent Labs  Lab 09/05/21 1506  WBC 5.6  NEUTROABS 4.4  HGB 10.3*  HCT 33.1*  MCV 91.4  PLT 161   Basic Metabolic Panel: Recent Labs  Lab 09/05/21 1506  NA 134*  K 4.0  CL 106  CO2 16*  GLUCOSE 81  BUN 23*  CREATININE 1.71*  CALCIUM 8.6*  MG 2.0   GFR: Estimated Creatinine Clearance: 26.9 mL/min (A) (by C-G formula based on SCr of 1.71 mg/dL (H)). Recent Labs  Lab 09/05/21 1506  WBC 5.6   Liver Function Tests: Recent Labs  Lab 09/05/21 1506  AST 32  ALT 20  ALKPHOS 81  BILITOT 0.2*  PROT 7.3  ALBUMIN 3.0*   No results for input(s): LIPASE, AMYLASE in the last 168 hours. Recent Labs  Lab 09/05/21 1702  AMMONIA 26   ABG    Component Value Date/Time   PHART 7.398 03/30/2021 1949   PCO2ART 35.8 03/30/2021 1949   PO2ART 98 03/30/2021 1949   HCO3 22.0 03/30/2021 1949   TCO2 23 03/30/2021 1949   ACIDBASEDEF 2.0 03/30/2021 1949   O2SAT 98.0 03/30/2021 1949    Coagulation Profile: No results for input(s): INR, PROTIME in the last 168 hours.  Cardiac Enzymes: No results for input(s): CKTOTAL, CKMB, CKMBINDEX, TROPONINI in the last 168 hours.  HbA1C: No results found for: HGBA1C  CBG: No results for input(s): GLUCAP in the last 168 hours.  Review of Systems:    Review of Systems  Unable to perform ROS: Patient unresponsive   Past Medical History:  She,  has a past medical history of Alcohol abuse, Allergy, Arthritis, Bipolar 1 disorder ([place]), Cancer ([place]) (01/21/2017), Cancer ([place]), Cirrhosis of liver ([place]), Depression, Genetic testing (03/24/2017), and Hypertension.   Surgical History:   Past Surgical History:  Procedure Laterality Date   ABDOMINAL PERINEAL BOWEL RESECTION N/A 06/18/2017   Procedure: ABDOMINAL PERINEAL RESECTION ERAS PATHWAY;  Surgeon: Leighton Ruff, MD;  Location: [facility];  Service: General;  Laterality: N/A;   COLON SURGERY     COLONOSCOPY WITH PROPOFOL Left 01/21/2017   Procedure: COLONOSCOPY WITH PROPOFOL;  Surgeon: Otis Brace, MD;  Location: [facility];  Service: Gastroenterology;  Laterality: Left;   FRACTURE SURGERY     MANDIBLE FRACTURE SURGERY       Social History:   reports that she has been smoking cigarettes. She has a 19.50 pack-year smoking history. She has never used smokeless tobacco. She reports current alcohol use. She reports current drug use. Drugs: "Crack" cocaine and Cocaine.   Family History:  Her family history includes Cancer (age of onset: 92) in her mother; Cancer - Other in her maternal aunt.   Allergies Allergies  Allergen Reactions   Penicillins Hives and Swelling    Did it involve swelling of the face/tongue/throat, SOB, or low BP? Yes Did it involve sudden or severe rash/hives, skin peeling, or any reaction on the inside of your mouth or nose? No Did you need to seek medical attention at a hospital or doctor's office? No When did it last happen?  <10 years   If all above answers are "NO", may proceed with cephalosporin use.    Penicillins Hives and Swelling    Has patient had a PCN reaction causing immediate rash, facial/tongue/throat swelling, SOB or lightheadedness with hypotension: Yes Has patient had a PCN reaction causing severe rash involving mucus membranes or skin  necrosis: No Has patient had a PCN reaction that required hospitalization No Has patient had a PCN reaction occurring within the last 10 years: Yes If all of the above answers are "NO", then may proceed with Cephalosporin use.      Home Medications  Prior to Admission medications   Medication Sig Start Date End Date Taking? Authorizing Provider  amLODipine (NORVASC) 10 MG tablet Take 1 tablet (10 mg total) by mouth daily. 06/30/21   Oswald Hillock, MD  busPIRone (BUSPAR) 15 MG tablet Take 1 tablet (15 mg total) by mouth 2 (two) times daily. 11/15/20   Argentina Donovan, PA-C  citalopram (CELEXA) 10 MG tablet Take 1 tablet (10 mg total) by mouth daily. 04/09/21   Pokhrel, Corrie Mckusick, MD  gabapentin (NEURONTIN) 100 MG capsule Take 1 capsule (100 mg total) by mouth 3 (three) times daily. 04/08/21   Pokhrel, Corrie Mckusick, MD  hydrOXYzine (ATARAX/VISTARIL) 10 MG tablet Take 1 tablet (10 mg total) by mouth 3 (three) times daily as needed. As needed for acute anxiety 11/15/20   Argentina Donovan, PA-C  Multiple Vitamin (MULTIVITAMIN WITH MINERALS) TABS tablet Place 1 tablet into feeding tube daily. Patient taking differently: Take 1 tablet by mouth daily. 04/09/21   Pokhrel, Corrie Mckusick, MD  omeprazole (PRILOSEC) 40 MG capsule Take 1 capsule (40 mg total) by mouth daily. 11/15/20   Argentina Donovan, PA-C  thiamine 100 MG tablet Take 1 tablet (100 mg total) by mouth daily. 07/01/21   Oswald Hillock, MD  ziprasidone (GEODON) 40 MG capsule Take 1 capsule (40 mg total) by mouth 2 (two) times daily with a meal. 11/06/11 01/09/12  Daleen Bo, MD     Critical care time: 74min

## 2021-09-05 NOTE — ED Triage Notes (Signed)
Pt here with etoh intoxication. Per friend on scene, pt has had multiple alcoholic beverages today. VSS with EMS.

## 2021-09-05 NOTE — ED Provider Notes (Signed)
[facility] EMERGENCY DEPARTMENT Provider Note   CSN: [id number] Arrival date & time:        History No chief complaint on file.   Sherry Baker is a 55 y.o. female.  HPI Patient presents via EMS.  History is obtained by those individuals the patient is clinically intoxicated, level 5 caveat secondary to altered mental status. Per EMS the patient was found in the grass aside from a gas station with multiple beer cans around her.  She has a known history of alcohol dependency, as well as cirrhosis, and rectal cancer.  She has a colostomy bag in place. No events reported evidence for trauma, no reported hemodynamic instability in route.  Seemingly the patient responded to painful stimuli, but was essentially sleeping throughout transport.    Past Medical History:  Diagnosis Date   Alcohol abuse    Allergy    PCNS swelling   Arthritis    Bipolar 1 disorder ([place])    Cancer ([place]) 01/21/2017   rectal cancer   Cancer ([place])    Cirrhosis of liver ([place])    Depression    Genetic testing 03/24/2017   Ms. Forgey underwent genetic counseling and testing for hereditary cancer syndromes on 02/17/2017. Her results were negative for mutations in all 46 genes analyzed by [facility]'s 46-gene Common Hereditary Cancers Panel. Genes analyzed include: APC, ATM, AXIN2, BARD1, BMPR1A, BRCA1, BRCA2, BRIP1, CDH1, CDKN2A, CHEK2, CTNNA1, DICER1, EPCAM, GREM1, HOXB13, KIT, MEN1, MLH1, MSH2, MSH3, MSH6, MUTYH, NBN,   Hypertension     Patient Active Problem List   Diagnosis Date Noted   Underweight 06/25/2021   Protein-calorie malnutrition, severe 06/25/2021   Homelessness 06/23/2021   Toxic encephalopathy 97/67/3419   Alcoholic cirrhosis of liver without ascites ([place]) 06/23/2021   COVID-19 virus infection 06/23/2021   Delirium tremens ([place]) 06/22/2021   Altered mental status 03/29/2021   Alcohol withdrawal ([place]) 03/28/2021   Alcohol abuse 06/14/2019   CKD stage II with proteinuria  06/06/2019   Normochromic normocytic anemia 06/06/2019   Pressure injury of skin 06/06/2019   Substance abuse ([place])    Seizure ([place]) 05/21/2019   Acute encephalopathy 05/18/2019   Acute lower UTI 05/18/2019   Polysubstance abuse ([place]) 05/18/2019   Wound dehiscence 07/10/2017   Genetic testing 03/24/2017   Hypertension 02/18/2017   GERD (gastroesophageal reflux disease) 02/18/2017   Rectal cancer ([place]) 01/27/2017   Abdominal pain, epigastric    Rectal mass    Fever    Anemia 01/16/2017   Abdominal pain    Constipation    Alcohol abuse    Tobacco abuse    Cocaine abuse ([place])    Bipolar affective disorder ([place])    Severe malnutrition ([place]) 02/13/2014   Suicide ideation 02/12/2014   Suicidal ideation 02/11/2014   Alcohol intoxication ([place]) 02/11/2014   Transaminitis 02/11/2014   Substance abuse ([place]) 02/11/2014   Depression 02/11/2014   Thrombocytopenia ([place]) 02/11/2014    Past Surgical History:  Procedure Laterality Date   ABDOMINAL PERINEAL BOWEL RESECTION N/A 06/18/2017   Procedure: ABDOMINAL PERINEAL RESECTION ERAS PATHWAY;  Surgeon: Leighton Ruff, MD;  Location: [facility];  Service: General;  Laterality: N/A;   COLON SURGERY     COLONOSCOPY WITH PROPOFOL Left 01/21/2017   Procedure: COLONOSCOPY WITH PROPOFOL;  Surgeon: Otis Brace, MD;  Location: [facility];  Service: Gastroenterology;  Laterality: Left;   FRACTURE SURGERY     MANDIBLE FRACTURE SURGERY       OB History   No obstetric history  on file.     Family History  Problem Relation Age of Onset   Cancer Mother 34       Originating in the abdomen, otherwise unknwon   Cancer - Other Maternal Aunt        Throat    Social History   Tobacco Use   Smoking status: Every Day    Packs/day: 0.50    Years: 39.00    Pack years: 19.50    Types: Cigarettes   Smokeless tobacco: Never  Vaping Use   Vaping Use: Never used  Substance Use Topics   Alcohol use: Yes    Comment: 07/28/2017 "40oz beer/day; if I  have it"   Drug use: Yes    Types: "Crack" cocaine, Cocaine    Comment: 07/28/2017 "nothing in 3 wks"    Home Medications Prior to Admission medications   Medication Sig Start Date End Date Taking? Authorizing Provider  amLODipine (NORVASC) 10 MG tablet Take 1 tablet (10 mg total) by mouth daily. 06/30/21   Oswald Hillock, MD  busPIRone (BUSPAR) 15 MG tablet Take 1 tablet (15 mg total) by mouth 2 (two) times daily. 11/15/20   Argentina Donovan, PA-C  citalopram (CELEXA) 10 MG tablet Take 1 tablet (10 mg total) by mouth daily. 04/09/21   Pokhrel, Corrie Mckusick, MD  gabapentin (NEURONTIN) 100 MG capsule Take 1 capsule (100 mg total) by mouth 3 (three) times daily. 04/08/21   Pokhrel, Corrie Mckusick, MD  hydrOXYzine (ATARAX/VISTARIL) 10 MG tablet Take 1 tablet (10 mg total) by mouth 3 (three) times daily as needed. As needed for acute anxiety 11/15/20   Argentina Donovan, PA-C  Multiple Vitamin (MULTIVITAMIN WITH MINERALS) TABS tablet Place 1 tablet into feeding tube daily. Patient taking differently: Take 1 tablet by mouth daily. 04/09/21   Pokhrel, Corrie Mckusick, MD  omeprazole (PRILOSEC) 40 MG capsule Take 1 capsule (40 mg total) by mouth daily. 11/15/20   Argentina Donovan, PA-C  thiamine 100 MG tablet Take 1 tablet (100 mg total) by mouth daily. 07/01/21   Oswald Hillock, MD  ziprasidone (GEODON) 40 MG capsule Take 1 capsule (40 mg total) by mouth 2 (two) times daily with a meal. 11/06/11 01/09/12  Daleen Bo, MD    Allergies    Penicillins and Penicillins  Review of Systems   Review of Systems  Unable to perform ROS: Mental status change   Physical Exam Updated Vital Signs There were no vitals taken for this visit.  Physical Exam Vitals and nursing note reviewed.  Constitutional:      Appearance: She is well-developed. She is ill-appearing.     Comments: Clinically intoxicated  HENT:     Head: Normocephalic and atraumatic.  Eyes:     Conjunctiva/sclera: Conjunctivae normal.  Cardiovascular:     Rate and  Rhythm: Normal rate and regular rhythm.  Pulmonary:     Effort: Pulmonary effort is normal. No respiratory distress.     Breath sounds: Normal breath sounds. No stridor.  Abdominal:     General: There is no distension.     Comments: Left lower quadrant colostomy bag in place.  Abdominal exam otherwise unremarkable.  Skin:    General: Skin is warm and dry.  Neurological:     Cranial Nerves: No cranial nerve deficit.     Comments: Response to painful stimuli with moving all extremities, no gross facial asymmetry, but the patient is intoxicated will require repeat evaluation.  Psychiatric:     Comments: Clinically intoxicated  ED Results / Procedures / Treatments   Labs (all labs ordered are listed, but only abnormal results are displayed) Labs Reviewed  COMPREHENSIVE METABOLIC PANEL  ETHANOL  CBC WITH DIFFERENTIAL/PLATELET    EKG None  Radiology No results found.  Procedures Procedures   Medications Ordered in ED Medications - No data to display  ED Course  I have reviewed the triage vital signs and the nursing notes.  Pertinent labs & imaging results that were available during my care of the patient were reviewed by me and considered in my medical decision making (see chart for details).  Consideration of altered mental status, though initial suspicion is for alcohol intoxication the patient placed on continuous cardiac monitoring, pulse oximetry.  Labs sent.  4:10 PM Patient in similar condition.  Vital signs unremarkable aside from mild hypertension MDM Rules/Calculators/A&P Adult female with history of alcohol abuse presents after being found in the grass near a gas station with multiple beer cans around her.  Patient response to painful stimuli, has no obvious evidence for trauma is hemodynamically unremarkable.  Labs, including alcohol level pending on signout, patient will likely require monitoring, management until she achieves sobriety.  Dr. Langston Masker is  aware. Final Clinical Impression(s) / ED Diagnoses Final diagnoses:  Acute alcohol intoxication, with delirium [facility])     Carmin Muskrat, MD 09/05/21 1611

## 2021-09-05 NOTE — ED Notes (Signed)
Pt returned from CT screaming and kicking, trying to hit staff. Pt unable to keep eyes open. EDP to bedside to reassess pt. Verbal order for 20mg  Geodon IM given to this RN by Dr. Langston Masker.

## 2021-09-05 NOTE — ED Notes (Signed)
Attempted report X1

## 2021-09-05 NOTE — ED Notes (Signed)
Pt' BP over 200s, MD Trifan made aware.

## 2021-09-05 NOTE — ED Provider Notes (Signed)
Pt here with AMS, found on the ground near gas station.  No evidence of trauma.  Etoh level negative.  Pending UDS.  Vitals stable CTH unremarkable Pending re-evaluation clinically  Clinical Course as of 09/05/21 2157  Thu Sep 05, 2021  1703 Patient abruptly woke up and is extremely agitated and nonsensical.  She is screaming mama" over and over again.  She could not be redirected by voice.  We tried to calm her up multiple times.  She has been striking her head against the edge of her bed bars.  We are having staff gently restrain her while administering IM Geodon.  I strongly suspect that this is a drug toxidrome, unfortunately we do need sedation at this point to keep her safe. [MT]  1716 Patient given geodon, now asleep, asked nursing to reposition with sitter or direct view of patient for monitoring.  Purewick in place, IV fluids being given. [MT]  64 CTH with sinus congestion, no acute ICH [MT]  1815 The patient has become increasingly hypertensive and tachycardic, while remaining sedated from the [place].  I reviewed her medical chart now see hospital admission for delirium tremens in August 2022, but also cocaine and marijuana toxicity.  I strongly suspect that this is again related to alcohol withdrawal.  I have ordered Ativan.  We will need to monitor her breathing. [MT]  1950 HR, BP improved with ativan.  Pt still sleeping, but no new hypoxia. [MT]  2029 Will page for admission - at this time her airway is protected, would be a candidate for step down ICU, if she deteriorates she may require upgrade to full ICU [MT]  2117 Critical care consulted to evaluate patient [MT]  2150 Pt assessed and intubated by critical care team for airway protection; will be admitted to ICU [MT]    Clinical Course User Index [MT] Raja Caputi, Carola Rhine, MD    .Critical Care Performed by: Wyvonnia Dusky, MD Authorized by: Wyvonnia Dusky, MD   Critical care provider statement:    Critical care time  (minutes):  55   Critical care time was exclusive of:  Separately billable procedures and treating other patients   Critical care was necessary to treat or prevent imminent or life-threatening deterioration of the following conditions:  Toxidrome   Critical care was time spent personally by me on the following activities:  Ordering and performing treatments and interventions, ordering and review of laboratory studies, ordering and review of radiographic studies, pulse oximetry, review of old charts, examination of patient and evaluation of patient's response to treatment Comments:     Suspected acute drug toxidrome, requiring repeat neuro assessments, IM sedation, repeat airway assessments    Wyvonnia Dusky, MD 09/05/21 2157

## 2021-09-05 NOTE — Procedures (Signed)
Intubation Procedure Note  Sherry Baker  [id number]  May 14, 1966  Date:09/05/21  Time:10:45 PM   Provider Performing: Hardin Negus   Procedure: Intubation (31500)  Indication(s) Respiratory Failure  Consent Unable to obtain consent due to emergent nature of procedure.   Anesthesia Etomidate and Rocuronium   Time Out Verified patient identification, verified procedure, site/side was marked, verified correct patient position, special equipment/implants available, medications/allergies/relevant history reviewed, required imaging and test results available.   Sterile Technique Usual hand hygeine, masks, and gloves were used   Procedure Description Patient positioned in bed supine.  Sedation given as noted above.  Patient was intubated with endotracheal tube using Glidescope w/ MAC3 blade. View was Grade 1 full glottis .  Number of attempts was 1.  Colorimetric CO2 detector was consistent with tracheal placement. Easy intubation with good view.   Complications/Tolerance None; patient tolerated the procedure well. Chest X-ray is ordered to verify placement.

## 2021-09-05 NOTE — Progress Notes (Signed)
Pt transported from ED09 to 1H65 with no complications.

## 2021-09-05 NOTE — ED Notes (Signed)
Pt placed on 2L [place] due to oxygen saturations of 90%. EDP aware.

## 2021-09-06 ENCOUNTER — Inpatient Hospital Stay (HOSPITAL_COMMUNITY): Payer: Medicaid Other

## 2021-09-06 DIAGNOSIS — F10931 Alcohol use, unspecified with withdrawal delirium: Secondary | ICD-10-CM | POA: Diagnosis not present

## 2021-09-06 DIAGNOSIS — F10921 Alcohol use, unspecified with intoxication delirium: Secondary | ICD-10-CM

## 2021-09-06 DIAGNOSIS — R9431 Abnormal electrocardiogram [ECG] [EKG]: Secondary | ICD-10-CM | POA: Diagnosis not present

## 2021-09-06 DIAGNOSIS — J9601 Acute respiratory failure with hypoxia: Secondary | ICD-10-CM | POA: Diagnosis not present

## 2021-09-06 LAB — CBC
HCT: 38.3 % (ref 36.0–46.0)
Hemoglobin: 11.9 g/dL — ABNORMAL LOW (ref 12.0–15.0)
MCH: 28.7 pg (ref 26.0–34.0)
MCHC: 31.1 g/dL (ref 30.0–36.0)
MCV: 92.3 fL (ref 80.0–100.0)
Platelets: 176 10*3/uL (ref 150–400)
RBC: 4.15 MIL/uL (ref 3.87–5.11)
RDW: 18 % — ABNORMAL HIGH (ref 11.5–15.5)
WBC: 4.5 10*3/uL (ref 4.0–10.5)
nRBC: 0 % (ref 0.0–0.2)

## 2021-09-06 LAB — POCT I-STAT 7, (LYTES, BLD GAS, ICA,H+H)
Acid-base deficit: 9 mmol/L — ABNORMAL HIGH (ref 0.0–2.0)
Acid-base deficit: 9 mmol/L — ABNORMAL HIGH (ref 0.0–2.0)
Bicarbonate: 16.6 mmol/L — ABNORMAL LOW (ref 20.0–28.0)
Bicarbonate: 15.2 mmol/L — ABNORMAL LOW (ref 20.0–28.0)
Calcium, Ion: 1.31 mmol/L (ref 1.15–1.40)
Calcium, Ion: 1.29 mmol/L (ref 1.15–1.40)
HCT: 36 % (ref 36.0–46.0)
HCT: 33 % — ABNORMAL LOW (ref 36.0–46.0)
Hemoglobin: 12.2 g/dL (ref 12.0–15.0)
Hemoglobin: 11.2 g/dL — ABNORMAL LOW (ref 12.0–15.0)
O2 Saturation: 95 %
O2 Saturation: 96 %
Patient temperature: 100.3
Patient temperature: 102.3
Potassium: 5.1 mmol/L (ref 3.5–5.1)
Potassium: 3.5 mmol/L (ref 3.5–5.1)
Sodium: 139 mmol/L (ref 135–145)
Sodium: 139 mmol/L (ref 135–145)
TCO2: 18 mmol/L — ABNORMAL LOW (ref 22–32)
TCO2: 16 mmol/L — ABNORMAL LOW (ref 22–32)
pCO2 arterial: 37.2 mmHg (ref 32.0–48.0)
pCO2 arterial: 30.9 mmHg — ABNORMAL LOW (ref 32.0–48.0)
pH, Arterial: 7.264 — ABNORMAL LOW (ref 7.350–7.450)
pH, Arterial: 7.311 — ABNORMAL LOW (ref 7.350–7.450)
pO2, Arterial: 87 mmHg (ref 83.0–108.0)
pO2, Arterial: 95 mmHg (ref 83.0–108.0)

## 2021-09-06 LAB — RESPIRATORY PANEL BY PCR

## 2021-09-06 LAB — GLUCOSE, CAPILLARY
Glucose-Capillary: 75 mg/dL (ref 70–99)
Glucose-Capillary: 69 mg/dL — ABNORMAL LOW (ref 70–99)
Glucose-Capillary: 88 mg/dL (ref 70–99)
Glucose-Capillary: 97 mg/dL (ref 70–99)
Glucose-Capillary: 118 mg/dL — ABNORMAL HIGH (ref 70–99)
Glucose-Capillary: 118 mg/dL — ABNORMAL HIGH (ref 70–99)

## 2021-09-06 LAB — TROPONIN I (HIGH SENSITIVITY)
Troponin I (High Sensitivity): 54 ng/L — ABNORMAL HIGH (ref <18)
Troponin I (High Sensitivity): 66 ng/L — ABNORMAL HIGH

## 2021-09-06 LAB — BASIC METABOLIC PANEL
Anion gap: 12 (ref 5–15)
BUN: 21 mg/dL — ABNORMAL HIGH (ref 6–20)
CO2: 16 mmol/L — ABNORMAL LOW (ref 22–32)
Calcium: 8.8 mg/dL — ABNORMAL LOW (ref 8.9–10.3)
Chloride: 111 mmol/L (ref 98–111)
Creatinine, Ser: 1.4 mg/dL — ABNORMAL HIGH (ref 0.44–1.00)
GFR, Estimated: 45 mL/min — ABNORMAL LOW (ref >60)
Glucose, Bld: 60 mg/dL — ABNORMAL LOW (ref 70–99)
Potassium: 3.5 mmol/L (ref 3.5–5.1)
Sodium: 139 mmol/L (ref 135–145)

## 2021-09-06 LAB — PHOSPHORUS: Phosphorus: 4.9 mg/dL — ABNORMAL HIGH (ref 2.5–4.6)

## 2021-09-06 LAB — LACTIC ACID, PLASMA
Lactic Acid, Venous: 2.3 mmol/L (ref 0.5–1.9)
Lactic Acid, Venous: 2.7 mmol/L (ref 0.5–1.9)

## 2021-09-06 LAB — MAGNESIUM: Magnesium: 2.1 mg/dL (ref 1.7–2.4)

## 2021-09-06 LAB — MRSA NEXT GEN BY PCR, NASAL: MRSA by PCR Next Gen: DETECTED — AB

## 2021-09-06 LAB — TRIGLYCERIDES: Triglycerides: 126 mg/dL (ref <150)

## 2021-09-06 MED ORDER — NITROGLYCERIN 2 % TD OINT
0.5000 [in_us] | TOPICAL_OINTMENT | Freq: Four times a day (QID) | TRANSDERMAL | Status: DC
  Administered 2021-09-06 (×4): 0.5 [in_us] via TOPICAL
  Filled 2021-09-06: qty 30

## 2021-09-06 MED ORDER — VANCOMYCIN HCL 1000 MG/200ML IV SOLN
1000.0000 mg | Freq: Once | INTRAVENOUS | Status: AC
  Administered 2021-09-06: 1000 mg via INTRAVENOUS
  Filled 2021-09-06: qty 200

## 2021-09-06 MED ORDER — LORAZEPAM 2 MG/ML IJ SOLN
1.0000 mg | INTRAMUSCULAR | Status: DC | PRN
  Administered 2021-09-07 (×2): 1 mg via INTRAVENOUS
  Administered 2021-09-08 – 2021-09-09 (×3): 2 mg via INTRAVENOUS
  Administered 2021-09-10 – 2021-09-11 (×3): 1 mg via INTRAVENOUS
  Administered 2021-09-11 – 2021-09-13 (×8): 2 mg via INTRAVENOUS
  Filled 2021-09-06 (×17): qty 1

## 2021-09-06 MED ORDER — CALCIUM CHLORIDE 10 % IV SOLN
INTRAVENOUS | Status: AC
  Administered 2021-09-06: 10 mg
  Filled 2021-09-06: qty 10

## 2021-09-06 MED ORDER — PROPOFOL 1000 MG/100ML IV EMUL
0.0000 ug/kg/min | INTRAVENOUS | Status: DC
  Administered 2021-09-06 (×2): 20 ug/kg/min via INTRAVENOUS
  Filled 2021-09-06 (×2): qty 100

## 2021-09-06 MED ORDER — FENTANYL CITRATE (PF) 100 MCG/2ML IJ SOLN
50.0000 ug | INTRAMUSCULAR | Status: DC | PRN
  Administered 2021-09-06: 50 ug via INTRAVENOUS

## 2021-09-06 MED ORDER — VANCOMYCIN HCL 500 MG/100ML IV SOLN
500.0000 mg | INTRAVENOUS | Status: DC
  Filled 2021-09-06: qty 100

## 2021-09-06 MED ORDER — SODIUM CHLORIDE 0.9 % IV SOLN
3.0000 g | Freq: Three times a day (TID) | INTRAVENOUS | Status: DC
  Administered 2021-09-06 – 2021-09-07 (×3): 3 g via INTRAVENOUS
  Filled 2021-09-06 (×3): qty 8

## 2021-09-06 MED ORDER — PROSOURCE TF PO LIQD
45.0000 mL | Freq: Every day | ORAL | Status: DC
  Administered 2021-09-06 – 2021-09-12 (×7): 45 mL
  Filled 2021-09-06 (×7): qty 45

## 2021-09-06 MED ORDER — DEXMEDETOMIDINE HCL IN NACL 400 MCG/100ML IV SOLN
0.0000 ug/kg/h | INTRAVENOUS | Status: DC
  Administered 2021-09-06: 0.4 ug/kg/h via INTRAVENOUS
  Administered 2021-09-06: 0.6 ug/kg/h via INTRAVENOUS
  Administered 2021-09-07: 0.8 ug/kg/h via INTRAVENOUS
  Filled 2021-09-06: qty 100

## 2021-09-06 MED ORDER — FENTANYL CITRATE (PF) 100 MCG/2ML IJ SOLN
INTRAMUSCULAR | Status: AC
  Administered 2021-09-06: 100 ug via INTRAVENOUS
  Filled 2021-09-06: qty 2

## 2021-09-06 MED ORDER — DEXTROSE IN LACTATED RINGERS 5 % IV SOLN: INTRAVENOUS | Status: DC

## 2021-09-06 MED ORDER — DEXTROSE 50 % IV SOLN
INTRAVENOUS | Status: AC
  Administered 2021-09-06: 50 mL
  Filled 2021-09-06: qty 50

## 2021-09-06 MED ORDER — ACETAMINOPHEN 160 MG/5ML PO SOLN
650.0000 mg | Freq: Four times a day (QID) | ORAL | Status: DC | PRN
  Administered 2021-09-06 – 2021-09-18 (×3): 650 mg
  Filled 2021-09-06 (×4): qty 20.3

## 2021-09-06 MED ORDER — ACETAMINOPHEN 325 MG PO TABS
650.0000 mg | ORAL_TABLET | Freq: Four times a day (QID) | ORAL | Status: DC | PRN
  Filled 2021-09-06: qty 2

## 2021-09-06 MED ORDER — SODIUM BICARBONATE 8.4 % IV SOLN
100.0000 meq | Freq: Once | INTRAVENOUS | Status: AC
  Administered 2021-09-06: 100 meq via INTRAVENOUS

## 2021-09-06 MED ORDER — LABETALOL HCL 5 MG/ML IV SOLN: 10.0000 mg | Freq: Four times a day (QID) | INTRAVENOUS | Status: DC | PRN

## 2021-09-06 MED ORDER — POTASSIUM CHLORIDE 20 MEQ PO PACK
40.0000 meq | PACK | Freq: Once | ORAL | Status: AC
  Administered 2021-09-06: 40 meq
  Filled 2021-09-06: qty 2

## 2021-09-06 MED ORDER — FENTANYL CITRATE (PF) 100 MCG/2ML IJ SOLN
50.0000 ug | INTRAMUSCULAR | Status: DC | PRN
  Administered 2021-09-06 – 2021-09-07 (×6): 100 ug via INTRAVENOUS
  Filled 2021-09-06 (×8): qty 2

## 2021-09-06 MED ORDER — CHLORHEXIDINE GLUCONATE CLOTH 2 % EX PADS: 6.0000 | MEDICATED_PAD | Freq: Every day | CUTANEOUS | Status: DC

## 2021-09-06 MED ORDER — SODIUM CHLORIDE 0.9 % IV SOLN
3.0000 g | Freq: Two times a day (BID) | INTRAVENOUS | Status: DC
  Administered 2021-09-06: 3 g via INTRAVENOUS
  Filled 2021-09-06: qty 8

## 2021-09-06 MED ORDER — ASPIRIN 81 MG PO CHEW
81.0000 mg | CHEWABLE_TABLET | Freq: Every day | ORAL | Status: DC
  Administered 2021-09-06: 81 mg
  Filled 2021-09-06: qty 1

## 2021-09-06 MED ORDER — ADULT MULTIVITAMIN W/MINERALS CH
1.0000 | ORAL_TABLET | Freq: Every day | ORAL | Status: DC
  Administered 2021-09-06 – 2021-09-20 (×14): 1
  Filled 2021-09-06 (×14): qty 1

## 2021-09-06 MED ORDER — VITAL 1.5 CAL PO LIQD
1000.0000 mL | ORAL | Status: DC
  Administered 2021-09-06 – 2021-09-10 (×4): 1000 mL
  Filled 2021-09-06 (×2): qty 1000

## 2021-09-06 MED ORDER — NICOTINE 14 MG/24HR TD PT24
14.0000 mg | MEDICATED_PATCH | Freq: Every day | TRANSDERMAL | Status: DC
  Administered 2021-09-06 – 2021-09-30 (×24): 14 mg via TRANSDERMAL
  Filled 2021-09-06 (×24): qty 1

## 2021-09-06 MED ORDER — MUPIROCIN 2 % EX OINT
1.0000 "application " | TOPICAL_OINTMENT | Freq: Two times a day (BID) | CUTANEOUS | Status: AC
  Administered 2021-09-06 – 2021-09-10 (×10): 1 via NASAL
  Filled 2021-09-06 (×3): qty 22

## 2021-09-06 MED ORDER — CHLORDIAZEPOXIDE HCL 25 MG PO CAPS
50.0000 mg | ORAL_CAPSULE | Freq: Three times a day (TID) | ORAL | Status: DC
  Administered 2021-09-06 – 2021-09-10 (×15): 50 mg
  Filled 2021-09-06 (×15): qty 2

## 2021-09-06 MED ORDER — SODIUM BICARBONATE 8.4 % IV SOLN
INTRAVENOUS | Status: AC
  Filled 2021-09-06: qty 100

## 2021-09-06 MED ORDER — NOREPINEPHRINE 4 MG/250ML-% IV SOLN
INTRAVENOUS | Status: AC
  Administered 2021-09-06: 4 mg
  Filled 2021-09-06: qty 250

## 2021-09-06 NOTE — Progress Notes (Signed)
[facility] Progress Note Patient Name: Sherry Baker DOB: April 24, 1966 MRN: [id number]   Date of Service  09/06/2021  HPI/Events of Note  Nursing concerned about: 1. HR dropping into 40's transiently. Likely d/t Propofol. Currently HR = 83. 2. EKG changes - EKG reveals normal sinus rhythm, possible Left atrial enlargement, Left ventricular hypertrophy ( Sokolow-Lyon , Cornell product , Romhilt-Estes ), marked ST abnormality, possible lateral subendocardial injury and prolonged QT. Can't B-Block d/t episodes of bradycardia. If Troponin with significant elevation, will start Heparin IV infusion if no contraindications.   eICU Interventions  Plan: Cycle Troponin. ASA 81 mg per tube now and Q day. Nitroglycerin ointment 2% 0.5 in to chest wall now and Q 6 hours. Hold for SBP < 100.      Intervention Category Major Interventions: Other: Intermediate Interventions: Other:  Lysle Dingwall 09/06/2021, 12:16 AM

## 2021-09-06 NOTE — Progress Notes (Signed)
  Echocardiogram 2D Echocardiogram has been performed.  Sherry Baker 09/06/2021, 2:53 PM

## 2021-09-06 NOTE — Progress Notes (Signed)
NAME:  Sherry Baker, MRN:  [id number], DOB:  09-24-1966, LOS: 1 ADMISSION DATE:  09/05/2021, CONSULTATION DATE:  09/05/21 REFERRING MD:  Dr. Langston Masker, CHIEF COMPLAINT:  AMS  History of Present Illness:  55 yo F admitted to the hospital for cocaine intoxication and etoh withdrawal. Pt was found minimally responsive behind a dumpster surrounded by beer cans. In the ED the pt was exhibiting hypertension, tachycardia and reportedly intermittent agitation so received 20mg  of geodon and 2mg  of ativan. Pt was intermittently somnolent prior to and after the meds in the ED. PCCM consulted for evaluation and pt was found to not be able to protect her airway, subsequently was intubated and admitted.  Pertinent  Medical History  ETOH use disorder complicated etoh withdrawals requiring prior intubation Cocaine abuse HTN CKD3 GERD Bipolar 1 Depression Tobacco dependence Rectal cancer s/p resection, chemo, and s/p ostomy  Significant Hospital Events: Including procedures, antibiotic start and stop dates in addition to other pertinent events   Intubated in ED on 11/3, admitted  Interim History / Subjective:   Critically ill, intubated and sedated Febrile to 102.59F this AM Tachycardic  Objective   Blood pressure (!) 152/90, pulse (!) 117, temperature (!) 102.3 F (39.1 C), temperature source Oral, resp. rate (!) 22, height 5\' 2"  (1.575 m), weight 46.3 kg, SpO2 100 %.    Vent Mode: PRVC FiO2 (%):  [40 %-100 %] 40 % Set Rate:  [22 bmp] 22 bmp Vt Set:  [420 mL] 420 mL PEEP:  [5 cmH20] 5 cmH20 Plateau Pressure:  [17 cmH20-26 cmH20] 17 cmH20   Intake/Output Summary (Last 24 hours) at 09/06/2021 0751 Last data filed at 09/06/2021 0700 Gross per 24 hour  Intake 262.91 ml  Output 1525 ml  Net -1262.09 ml   Filed Weights   09/05/21 2147 09/05/21 2320 09/06/21 0100  Weight: 44.8 kg 46.3 kg 46.3 kg    Examination: General: Thin chronically ill-appearing female, sedated, NAD Eyes: PERRL HENT:  intubated Lungs: CTAB Cardiovascular: Tachycardic, regular rhythm, no murmurs Abdomen: soft, non-tender, +BS Extremities: WWP, no edema Neuro: sedated, not responding to verbal stimulation, withdraws to pain    Resolved Hospital Problem list   Severe HTN  Assessment & Plan:  55 yo F w/ a hx of ETOH use disorder, complicated etoh withdrawals requiring prior intubation, Cocaine abuse, HTN, CKD3, GERD, hx of rectal cancer s/p resection w/ ostomy, Bipolar1, depression, tobacco abuse who is admitted to the hospital for cocaine intoxication and etoh withdrawal.  Acute metabolic encephalopathy likely from alcohol withdrawal with delirium tremens Likely from drug ingestion and alcohol withdrawal with DT, intubated as unable to protect airway. - ventilated on PRVC, wean support as able - change propofol to Precedex gtt, RASS goal 0 to -1 - fentanyl prn - change to Librium taper with CIWA protocol lorazepam - thiamine, folate - D5LR  Fever likely secondary to aspiration pneumonia Consolidation seen on the right lung base suspicious for aspiration pneumonia given location and AMS - IV ampicillin-sulbactam - APAP prn - obtain respiratory culture, blood cultures  Metabolic acidosis Normal AG. pH improving. - obtain lactate - D5LR  ST changes ST changes with possible lateral subendocardial endocardial injury seen on EKG. Troponins mildly elevated 54 > 66. - obtain TTE - NTG ointment q6h  Cocaine abuse Positive UDS - supportive care  CKDIII Cr improved, around baseline now  History of rectal cancer s/p resection w/ ostomy - routine ostomy care  GERD - PPI while intubated  Bipolar 1 disorder S/p  ziprasidone 20 mg in ED for agitation - consider psych consult when awake  Tobacco use disorder - NRT when extubated   Best Practice (right click and "Reselect all SmartList Selections" daily)   Diet/type: NPO w/ meds via tube DVT prophylaxis: prophylactic heparin  GI  prophylaxis: PPI Lines: N/A Foley:  Yes, and it is still needed Code Status:  full code Last date of multidisciplinary goals of care discussion [unknown]  Labs   CBC: Recent Labs  Lab 09/05/21 1506 09/05/21 2228 09/06/21 0212 09/06/21 0607  WBC 5.6  --  4.5  --   NEUTROABS 4.4  --   --   --   HGB 10.3* 11.6* 11.9* 12.2  HCT 33.1* 34.0* 38.3 36.0  MCV 91.4  --  92.3  --   PLT 166  --  176  --     Basic Metabolic Panel: Recent Labs  Lab 09/05/21 1506 09/05/21 2228 09/06/21 0212 09/06/21 0607  NA 134* 139 139 139  K 4.0 3.5 3.5 5.1  CL 106  --  111  --   CO2 16*  --  16*  --   GLUCOSE 81  --  60*  --   BUN 23*  --  21*  --   CREATININE 1.71*  --  1.40*  --   CALCIUM 8.6*  --  8.8*  --   MG 2.0  --  2.1  --   PHOS  --   --  4.9*  --    GFR: Estimated Creatinine Clearance: 34 mL/min (A) (by C-G formula based on SCr of 1.4 mg/dL (H)). Recent Labs  Lab 09/05/21 1506 09/06/21 0212  WBC 5.6 4.5    Liver Function Tests: Recent Labs  Lab 09/05/21 1506  AST 32  ALT 20  ALKPHOS 81  BILITOT 0.2*  PROT 7.3  ALBUMIN 3.0*   No results for input(s): LIPASE, AMYLASE in the last 168 hours. Recent Labs  Lab 09/05/21 1702  AMMONIA 26    ABG    Component Value Date/Time   PHART 7.264 (L) 09/06/2021 0607   PCO2ART 37.2 09/06/2021 0607   PO2ART 87 09/06/2021 0607   HCO3 16.6 (L) 09/06/2021 0607   TCO2 18 (L) 09/06/2021 0607   ACIDBASEDEF 9.0 (H) 09/06/2021 0607   O2SAT 95.0 09/06/2021 0607     Coagulation Profile: No results for input(s): INR, PROTIME in the last 168 hours.  Cardiac Enzymes: No results for input(s): CKTOTAL, CKMB, CKMBINDEX, TROPONINI in the last 168 hours.  HbA1C: No results found for: HGBA1C  CBG: Recent Labs  Lab 09/05/21 2304 09/06/21 0401 09/06/21 0711  GLUCAP 73 75 69*    Critical care time:      Zola Button, MD [place], PGY-2

## 2021-09-06 NOTE — Consult Note (Signed)
[place] Nurse ostomy follow up Patient receiving care in [facility] 3M03. Patient is intubated and sedated. Primary RN assisting with turning for rectal area evaluation. Stoma type/location: LUQ colostomy Stomal assessment/size: as measured through the existing one piece pouch with filer, approximately 1 inch, budded, round Peristomal assessment: deferred Treatment options for stomal/peristomal skin: barrier ring Output: pudding consistency brown Ostomy pouching: 1pc. Keep ostomy pouch Pattricia Boss, and barrier rings Kellie Simmering [phone] at the bedside for staff to provide ostomy care. The [place] was requested to order these supplies today. Education provided: none The patient has a history of rectal cancer, so the area of abnormal appearance at the rectum is residual surgical scarring that is healed. WOC Nurse Consult Note: Reason for Consult: skin tear left arm Wound type: skin tear Pressure Injury POA: Yes/No/NA Measurement: Wound bed: Drainage (amount, consistency, odor)  Periwound: intact Dressing procedure/placement/frequency: Place a size appropriate piece of xeroform over the left arm skin tear, then a size appropriate foam dressing. When changing, saturate with saline if needed to remove without causing additional trauma to the area.  Monitor the wound area(s) for worsening of condition such as: Signs/symptoms of infection,  Increase in size,  Development of or worsening of odor, Development of pain, or increased pain at the affected locations.  Notify the medical team if any of these develop.  Thank you for the consult.  Discussed plan of care with the bedside nurse.  [place] nurse will not follow at this time.  Please re-consult the [place] team if needed.  Val Riles, RN, MSN, CWOCN, CNS-BC, pager [phone]

## 2021-09-06 NOTE — Progress Notes (Signed)
[facility] ADULT ICU REPLACEMENT PROTOCOL   The patient does apply for the [facility] Adult ICU Electrolyte Replacment Protocol based on the criteria listed below:   1.Exclusion criteria: TCTS patients, ECMO patients, and Dialysis patients 2. Is GFR >/= 30 ml/min? Yes.    Patient's GFR today is 45 3. Is SCr </= 2? Yes.   Patient's SCr is 1.40 mg/dL 4. Did SCr increase >/= 0.5 in 24 hours? No. 5.Pt's weight >40kg  Yes.   6. Abnormal electrolyte(s): K+ 3.5  7. Electrolytes replaced per protocol 8.  Call MD STAT for K+ </= 2.5, Phos </= 1, or Mag </= 1 Physician:  n/a  Darlys Gales 09/06/2021 3:45 AM

## 2021-09-06 NOTE — Progress Notes (Signed)
Initial Nutrition Assessment  DOCUMENTATION CODES:   Severe malnutrition in context of chronic illness  INTERVENTION:   Tube feeding via OG tube: - Start Vital 1.5 @ 20 ml/hr and advance by 10 ml q 6 hours to goal rate of 45 ml/hr (1080 ml/day) - ProSource TF 45 ml daily  Tube feeding regimen at goal provides 1660 kcal, 84 grams of protein, and 825 ml of H2O.   - Continue MVI with minerals daily  Monitor magnesium, potassium, and phosphorus BID for at least 3 days, MD to replete as needed, as pt is at risk for refeeding syndrome given severe malnutrition, EtOH abuse.  NUTRITION DIAGNOSIS:   Severe Malnutrition related to chronic illness (cirrhosis, polysubstance abuse) as evidenced by severe muscle depletion, severe fat depletion.  GOAL:   Patient will meet greater than or equal to 90% of their needs  MONITOR:   Vent status, Labs, Weight trends, TF tolerance, Skin, I & O's  REASON FOR ASSESSMENT:   Ventilator, Consult Enteral/tube feeding initiation and management  ASSESSMENT:   55 year old female who presented to the ED on 11/03 with AMS. PMH of EtOH abuse, cirrhosis, complicated EtOH withdrawals requiring prior intubation, cocaine abuse, HTN, CKD stage III, GERD, rectal cancer s/p resection with colostomy, bipolar 1 disorder, depression, tobacco abuse. Pt admitted with cocaine intoxication and EtOH withdrawal and required intubation for airway protection.  Discussed pt with RN and during ICU rounds. Pt with acute metabolic/toxic encephalopathy, alcohol withdrawal, and aspiration PNA.  Consult received for tube feeding initiation and management. Pt with OG tube side port at level of GE junction per chest x-ray, currently to low intermittent suction. Radiologist recommended advancement. Discussed OG tube placement with RN who is going to advance OG tube and obtain abdominal x-ray to confirm gastric placement. Reviewed abdominal x-ray that was obtained after RN advanced OG  tube which reads "tube advanced with the tip in the distal body of the stomach."  Unable to obtain diet and weight history at this time. Reviewed weight history in chart. Pt's weight has fluctuated between 43-39 kg over the last 11 months.  Admit weight: 44.8 kg Current eight: 46.3 kg  Patient is currently intubated on ventilator support MV: 12.4 L/min Temp (24hrs), Avg:99.2 F (37.3 C), Min:97.3 F (36.3 C), Max:102.3 F (39.1 C)  Drips: Precedex D5 in LR: 125 ml/hr  Medications reviewed and include: colace, folic acid, MVI with minerals, IV protonix, miralax, IV thiamine, IV abx  Labs reviewed: BUN 21, creatinine 1.40, phosphorus 4.9 CBG's: 69-75  UOP: 1325 ml x 12 hours OGT: 200 ml x 12 hours I/O's: -1.4 L since admit  NUTRITION - FOCUSED PHYSICAL EXAM:  Flowsheet Row Most Recent Value  Orbital Region Severe depletion  Upper Arm Region Severe depletion  Thoracic and Lumbar Region Severe depletion  Buccal Region Unable to assess  Temple Region Moderate depletion  Clavicle Bone Region Severe depletion  Clavicle and Acromion Bone Region Severe depletion  Scapular Bone Region Unable to assess  Dorsal Hand Severe depletion  Patellar Region Severe depletion  Anterior Thigh Region Severe depletion  Posterior Calf Region Moderate depletion  Edema (RD Assessment) None  Hair Reviewed  Eyes Unable to assess  Mouth Unable to assess  Skin Reviewed  Nails Reviewed       Diet Order:   Diet Order             Diet NPO time specified  Diet effective now  EDUCATION NEEDS:   Not appropriate for education at this time  Skin:  Skin Assessment: Skin Integrity Issues: Stage III: coccyx Other: skin tear L arm  Last BM:  no documented output from colostomy  Height:   Ht Readings from Last 1 Encounters:  09/05/21 5\' 2"  (1.575 m)    Weight:   Wt Readings from Last 1 Encounters:  09/06/21 46.3 kg    BMI:  Body mass index is 18.67  kg/m.  Estimated Nutritional Needs:   Kcal:  1500-1700  Protein:  75-90 grams  Fluid:  1.5-1.7 L    Gustavus Bryant, MS, RD, LDN Inpatient Clinical Dietitian Please see AMiON for contact information.

## 2021-09-06 NOTE — Progress Notes (Addendum)
[facility] Progress Note Patient Name: Sherry Baker DOB: July 27, 1966 MRN: [id number]   Date of Service  09/06/2021  HPI/Events of Note  ABG on 40%/PRVC 22/TV420/P 6 = 7.264/37.2/87/16.6.  eICU Interventions  Plan: Increase PRVC rate to 30. Repeat ABG at 8 AM     Intervention Category Major Interventions: Respiratory failure - evaluation and management;Acid-Base disturbance - evaluation and management  Janilah Hojnacki [place] 09/06/2021, 6:34 AM

## 2021-09-06 NOTE — Progress Notes (Signed)
[facility] Progress Note Patient Name: Sherry Baker DOB: 17-Feb-1966 MRN: [id number]   Date of Service  09/06/2021  HPI/Events of Note  Troponin #1 = 54.   eICU Interventions  Plan: Continue to trend Troponin.  Continue present management.      Intervention Category Major Interventions: Other:  Lysle Dingwall 09/06/2021, 3:45 AM

## 2021-09-06 NOTE — Progress Notes (Signed)
Pharmacy Antibiotic Note  Sherry Baker is a 55 y.o. female admitted on 09/05/2021 with pneumonia.  Pharmacy has been consulted for vancomycin dosing. Patient tested positive for MRSA PCR and the gram stain on the respiratory culture showed moderate GPCs. Until the respiratory culture results, broadening coverage for possible MRSA.  Plan: Vancomcyin 1000 mg IV x 1 Vancomycin 500 mg IV Q 24 hrs. Goal AUC 400-550. Expected AUC: 460.2 SCr used: 1.4 Continue Unasyn 3 g q8h.   Height: 5\' 2"  (157.5 cm) Weight: 46.3 kg (102 lb 1.2 oz) IBW/kg (Calculated) : 50.1  Temp (24hrs), Avg:99.2 F (37.3 C), Min:97.3 F (36.3 C), Max:102.3 F (39.1 C)  Recent Labs  Lab 09/05/21 1506 09/06/21 0212 09/06/21 1006 09/06/21 1257  WBC 5.6 4.5  --   --   CREATININE 1.71* 1.40*  --   --   LATICACIDVEN  --   --  2.3* 2.7*    Estimated Creatinine Clearance: 34 mL/min (A) (by C-G formula based on SCr of 1.4 mg/dL (H)).    Allergies  Allergen Reactions   Penicillins Hives and Swelling    Did it involve swelling of the face/tongue/throat, SOB, or low BP? Yes Did it involve sudden or severe rash/hives, skin peeling, or any reaction on the inside of your mouth or nose? No Did you need to seek medical attention at a hospital or doctor's office? No When did it last happen?  <10 years   If all above answers are "NO", may proceed with cephalosporin use.    Penicillins Hives and Swelling    Has patient had a PCN reaction causing immediate rash, facial/tongue/throat swelling, SOB or lightheadedness with hypotension: Yes Has patient had a PCN reaction causing severe rash involving mucus membranes or skin necrosis: No Has patient had a PCN reaction that required hospitalization No Has patient had a PCN reaction occurring within the last 10 years: Yes If all of the above answers are "NO", then may proceed with Cephalosporin use.     Antimicrobials this admission: Vancomycin 11/4 >>  Unasyn 11/4  >> Mupirocin ointment 11/4 >> (11/8)  Dose adjustments this admission: None.  Microbiology results: 11/4 BCx: collected 11/4 Sputum: moderate GPCs  11/4 MRSA PCR: positive   Thank you for allowing pharmacy to be a part of this patient's care.  Varney Daily, PharmD PGY1 Pharmacy Resident  Please check AMION for all [facility] pharmacy phone numbers After 10:00 PM call main pharmacy [phone]

## 2021-09-06 NOTE — Progress Notes (Signed)
MRSA PCR +. Positive MRSA order set initiated.

## 2021-09-06 NOTE — Procedures (Signed)
Arterial Catheter Insertion Procedure Note  Sherry Baker  [id number]  12-11-1965  Date:09/06/21  Time:12:12 PM    Provider Performing: Jacky Kindle    Procedure: Insertion of Arterial Line [phone]) with US guidance ([id number])   Indication(s) Blood pressure monitoring and/or need for frequent ABGs  Consent Risks of the procedure as well as the alternatives and risks of each were explained to the patient and/or caregiver.  Consent for the procedure was obtained and is signed in the bedside chart  Anesthesia None   Time Out Verified patient identification, verified procedure, site/side was marked, verified correct patient position, special equipment/implants available, medications/allergies/relevant history reviewed, required imaging and test results available.   Sterile Technique Maximal sterile technique including full sterile barrier drape, hand hygiene, sterile gown, sterile gloves, mask, hair covering, sterile ultrasound probe cover (if used).   Procedure Description Area of catheter insertion was cleaned with chlorhexidine and draped in sterile fashion. With real-time ultrasound guidance an arterial catheter was placed into the right  Axillary  artery.  Appropriate arterial tracings confirmed on monitor.     Complications/Tolerance None; patient tolerated the procedure well.   EBL Minimal   Specimen(s) None

## 2021-09-06 NOTE — Plan of Care (Signed)
  Problem: Respiratory: Goal: Ability to maintain a clear airway and adequate ventilation will improve Outcome: Progressing   

## 2021-09-07 DIAGNOSIS — F10921 Alcohol use, unspecified with intoxication delirium: Secondary | ICD-10-CM | POA: Diagnosis not present

## 2021-09-07 DIAGNOSIS — J9601 Acute respiratory failure with hypoxia: Secondary | ICD-10-CM | POA: Diagnosis not present

## 2021-09-07 DIAGNOSIS — F10931 Alcohol use, unspecified with withdrawal delirium: Secondary | ICD-10-CM | POA: Diagnosis not present

## 2021-09-07 LAB — MAGNESIUM
Magnesium: 1.7 mg/dL (ref 1.7–2.4)
Magnesium: 2.1 mg/dL (ref 1.7–2.4)

## 2021-09-07 LAB — POCT I-STAT 7, (LYTES, BLD GAS, ICA,H+H)
Acid-base deficit: 3 mmol/L — ABNORMAL HIGH (ref 0.0–2.0)
Bicarbonate: 20.9 mmol/L (ref 20.0–28.0)
Calcium, Ion: 1.29 mmol/L (ref 1.15–1.40)
HCT: 25 % — ABNORMAL LOW (ref 36.0–46.0)
Hemoglobin: 8.5 g/dL — ABNORMAL LOW (ref 12.0–15.0)
O2 Saturation: 100 %
Patient temperature: 98
Potassium: 3.3 mmol/L — ABNORMAL LOW (ref 3.5–5.1)
Sodium: 142 mmol/L (ref 135–145)
TCO2: 22 mmol/L (ref 22–32)
pCO2 arterial: 31.9 mmHg — ABNORMAL LOW (ref 32.0–48.0)
pH, Arterial: 7.423 (ref 7.350–7.450)
pO2, Arterial: 163 mmHg — ABNORMAL HIGH (ref 83.0–108.0)

## 2021-09-07 LAB — GLUCOSE, CAPILLARY
Glucose-Capillary: 129 mg/dL — ABNORMAL HIGH (ref 70–99)
Glucose-Capillary: 95 mg/dL (ref 70–99)
Glucose-Capillary: 114 mg/dL — ABNORMAL HIGH (ref 70–99)
Glucose-Capillary: 113 mg/dL — ABNORMAL HIGH (ref 70–99)
Glucose-Capillary: 105 mg/dL — ABNORMAL HIGH (ref 70–99)

## 2021-09-07 LAB — BASIC METABOLIC PANEL
Anion gap: 7 (ref 5–15)
BUN: 18 mg/dL (ref 6–20)
CO2: 20 mmol/L — ABNORMAL LOW (ref 22–32)
Calcium: 8.6 mg/dL — ABNORMAL LOW (ref 8.9–10.3)
Chloride: 114 mmol/L — ABNORMAL HIGH (ref 98–111)
Creatinine, Ser: 1.32 mg/dL — ABNORMAL HIGH (ref 0.44–1.00)
GFR, Estimated: 48 mL/min — ABNORMAL LOW (ref >60)
Glucose, Bld: 149 mg/dL — ABNORMAL HIGH (ref 70–99)
Potassium: 3.3 mmol/L — ABNORMAL LOW (ref 3.5–5.1)
Sodium: 141 mmol/L (ref 135–145)

## 2021-09-07 LAB — CBC
HCT: 29.1 % — ABNORMAL LOW (ref 36.0–46.0)
Hemoglobin: 9.4 g/dL — ABNORMAL LOW (ref 12.0–15.0)
MCH: 29.6 pg (ref 26.0–34.0)
MCHC: 32.3 g/dL (ref 30.0–36.0)
MCV: 91.5 fL (ref 80.0–100.0)
Platelets: 128 10*3/uL — ABNORMAL LOW (ref 150–400)
RBC: 3.18 MIL/uL — ABNORMAL LOW (ref 3.87–5.11)
RDW: 18.2 % — ABNORMAL HIGH (ref 11.5–15.5)
WBC: 7.2 10*3/uL (ref 4.0–10.5)
nRBC: 0 % (ref 0.0–0.2)

## 2021-09-07 LAB — PHOSPHORUS
Phosphorus: 2.3 mg/dL — ABNORMAL LOW (ref 2.5–4.6)
Phosphorus: 3.7 mg/dL (ref 2.5–4.6)

## 2021-09-07 LAB — TRIGLYCERIDES: Triglycerides: 78 mg/dL (ref <150)

## 2021-09-07 MED ORDER — POTASSIUM CHLORIDE 20 MEQ PO PACK
20.0000 meq | PACK | Freq: Once | ORAL | Status: AC
  Administered 2021-09-07: 20 meq
  Filled 2021-09-07: qty 1

## 2021-09-07 MED ORDER — FENTANYL 2500MCG IN NS 250ML (10MCG/ML) PREMIX INFUSION
50.0000 ug/h | INTRAVENOUS | Status: DC
  Administered 2021-09-07: 100 ug/h via INTRAVENOUS
  Administered 2021-09-08: 65 ug/h via INTRAVENOUS
  Administered 2021-09-09: 50 ug/h via INTRAVENOUS
  Administered 2021-09-10: 175 ug/h via INTRAVENOUS
  Administered 2021-09-11: 50 ug/h via INTRAVENOUS
  Administered 2021-09-11: 150 ug/h via INTRAVENOUS
  Filled 2021-09-07 (×6): qty 250

## 2021-09-07 MED ORDER — CHLORHEXIDINE GLUCONATE CLOTH 2 % EX PADS
6.0000 | MEDICATED_PAD | Freq: Every day | CUTANEOUS | Status: DC
  Administered 2021-09-07: 6 via TOPICAL

## 2021-09-07 MED ORDER — MAGNESIUM SULFATE 2 GM/50ML IV SOLN
2.0000 g | Freq: Once | INTRAVENOUS | Status: AC
  Administered 2021-09-07: 2 g via INTRAVENOUS
  Filled 2021-09-07: qty 50

## 2021-09-07 MED ORDER — FENTANYL CITRATE (PF) 100 MCG/2ML IJ SOLN
50.0000 ug | Freq: Once | INTRAMUSCULAR | Status: AC
  Administered 2021-09-07: 50 ug via INTRAVENOUS

## 2021-09-07 MED ORDER — POTASSIUM PHOSPHATES 15 MMOLE/5ML IV SOLN
15.0000 mmol | Freq: Once | INTRAVENOUS | Status: AC
  Administered 2021-09-07: 15 mmol via INTRAVENOUS
  Filled 2021-09-07: qty 5

## 2021-09-07 MED ORDER — CHLORHEXIDINE GLUCONATE CLOTH 2 % EX PADS
6.0000 | MEDICATED_PAD | Freq: Every day | CUTANEOUS | Status: DC
  Administered 2021-09-08: 6 via TOPICAL

## 2021-09-07 MED ORDER — FENTANYL BOLUS VIA INFUSION
50.0000 ug | INTRAVENOUS | Status: DC | PRN
  Administered 2021-09-07 – 2021-09-08 (×10): 50 ug via INTRAVENOUS
  Administered 2021-09-08 (×2): 100 ug via INTRAVENOUS
  Administered 2021-09-08: 50 ug via INTRAVENOUS
  Administered 2021-09-09: 100 ug via INTRAVENOUS
  Administered 2021-09-09 (×2): 50 ug via INTRAVENOUS
  Administered 2021-09-09 (×3): 100 ug via INTRAVENOUS
  Administered 2021-09-09 (×2): 50 ug via INTRAVENOUS
  Administered 2021-09-09 – 2021-09-12 (×10): 100 ug via INTRAVENOUS
  Filled 2021-09-07: qty 100

## 2021-09-07 NOTE — Progress Notes (Signed)
NAME:  Sherry Baker, MRN:  [id number], DOB:  1966/09/19, LOS: 2 ADMISSION DATE:  09/05/2021, CONSULTATION DATE:  09/05/21 REFERRING MD:  Dr. Langston Masker, CHIEF COMPLAINT:  AMS  History of Present Illness:  55 yo F admitted to the hospital for cocaine intoxication and etoh withdrawal. Pt was found minimally responsive behind a dumpster surrounded by beer cans. In the ED the pt was exhibiting hypertension, tachycardia and reportedly intermittent agitation so received 20mg  of geodon and 2mg  of ativan. Pt was intermittently somnolent prior to and after the meds in the ED. PCCM consulted for evaluation and pt was found to not be able to protect her airway, subsequently was intubated and admitted.  Pertinent  Medical History  ETOH use disorder complicated etoh withdrawals requiring prior intubation Cocaine abuse HTN CKD3 GERD Bipolar 1 Depression Tobacco dependence Rectal cancer s/p resection, chemo, and s/p ostomy  Significant Hospital Events: Including procedures, antibiotic start and stop dates in addition to other pertinent events   11/3 Intubated in ED on, admitted 11/5 bradycardia with a heart rate dropped to 46 early a.m. felt secondary to Precedex which was stopped.  This is improving  Interim History / Subjective:  Seen lying in bed with mild agitation on ventilator T-max 98.6 overnight  Objective   Blood pressure 99/62, pulse (!) 49, temperature 98 F (36.7 C), temperature source Axillary, resp. rate (!) 30, height 5\' 2"  (1.575 m), weight 48.3 kg, SpO2 100 %.    Vent Mode: PRVC FiO2 (%):  [40 %] 40 % Set Rate:  [30 bmp] 30 bmp Vt Set:  [420 mL] 420 mL PEEP:  [5 cmH20] 5 cmH20 Plateau Pressure:  [15 cmH20-20 cmH20] 15 cmH20   Intake/Output Summary (Last 24 hours) at 09/07/2021 0703 Last data filed at 09/07/2021 0700 Gross per 24 hour  Intake 4343.18 ml  Output 1145 ml  Net 3198.18 ml    Filed Weights   09/05/21 2320 09/06/21 0100 09/07/21 0143  Weight: 46.3 kg 46.3 kg  48.3 kg    Examination: General: Acute on chronic ill-appearing middle-aged female lying in bed in no acute distress HEENT: ETT, MM pink/moist, PERRL,  Neuro: Eyes spontaneously open and seen moving all extremities but unable to follow commands CV: s1s2 regular rate and rhythm, no murmur, rubs, or gallops,  PULM: Coarse breath sounds bilaterally, tolerating ventilator, no increased work of breathing, did tolerate very short SBT trial this a.m. GI: soft, bowel sounds active in all 4 quadrants, non-tender, non-distended, tolerating TF Extremities: warm/dry, no edema  Skin: no rashes or lesions  Resolved Hospital Problem list   Severe HTN Metabolic acidosis  Assessment & Plan:  55 yo F w/ a hx of ETOH use disorder, complicated etoh withdrawals requiring prior intubation, Cocaine abuse, HTN, CKD3, GERD, hx of rectal cancer s/p resection w/ ostomy, Bipolar1, depression, tobacco abuse who is admitted to the hospital for cocaine intoxication and etoh withdrawal.  Acute metabolic encephalopathy likely from alcohol withdrawal with delirium tremens -Likely from drug ingestion and alcohol withdrawal with DT, intubated as unable to protect airway. P: Continue ventilator support with lung protective strategies  Wean PEEP and FiO2 for sats greater than 90% Mentation improving with decreased sedation but currently remains preclusion to extubation Head of bed elevated 30 degrees. Plateau pressures less than 30 cm H20.  Follow intermittent chest x-ray and ABG.   Ensure adequate pulmonary hygiene  Follow cultures  VAP bundle in place  PAD protocol  Aspiration pneumonia -Consolidation seen on the right lung base  suspicious for aspiration pneumonia given location and AMS P: Continue empiric Unasyn Tylenol for recurrent fever Follow respiratory cultures Trend fever curve and CBC  ST changes Diastolic dysfunction seen on echo -ST changes with possible lateral subendocardial endocardial injury  seen on EKG. Troponins mildly elevated 54 > 66. -Echo with EF 55 to 60% and grade 1 diastolic dysfunction P: Continuous telemetry Monitor volume status Strict intake and output  Daily weight to assess volume status Daily assessment for need to diurese with the use of IV lasix   Closely monitor renal function and electrolytes  Ensure hemodynamic control  Cocaine abuse -Positive UDS P: Supportive care Cessation education when appropriate  CKDIII -Creatinine 1.28 06/30/2021, creatinine on admission 1.71 P: Follow renal function  Monitor urine output Trend Bmet Avoid nephrotoxins Ensure adequate renal perfusion  IV hydration  History of rectal cancer s/p resection w/ ostomy P: Routine ostomy care  GERD P: PPI  Bipolar 1 disorder -S/p ziprasidone 20 mg in ED for agitation P: Supportive care Consider psych consult when awake and able to interact  Tobacco use disorder P: Cessation education when appropriate  Hypokalemia Hypophosphatemia P: Supplement as needed  Best Practice (right click and "Reselect all SmartList Selections" daily)   Diet/type: NPO w/ meds via tube DVT prophylaxis: prophylactic heparin  GI prophylaxis: PPI Lines: N/A Foley:  Yes, and it is still needed Code Status:  full code Last date of multidisciplinary goals of care discussion:  Critical care time:   CRITICAL CARE Performed by: Shirle Provencal D. Harris  Total critical care time: 40 minutes  Critical care time was exclusive of separately billable procedures and treating other patients.  Critical care was necessary to treat or prevent imminent or life-threatening deterioration.  Critical care was time spent personally by me on the following activities: development of treatment plan with patient and/or surrogate as well as nursing, discussions with consultants, evaluation of patient's response to treatment, examination of patient, obtaining history from patient or surrogate, ordering and  performing treatments and interventions, ordering and review of laboratory studies, ordering and review of radiographic studies, pulse oximetry and re-evaluation of patient's condition.  Syrita Dovel D. Kenton Kingfisher, NP-C [facility] Personal contact information can be found on Amion  09/07/2021, 7:07 AM

## 2021-09-07 NOTE — Progress Notes (Signed)
[facility] Progress Note Patient Name: Sherry Baker DOB: 07-01-1966 MRN: [id number]   Date of Service  09/07/2021  HPI/Events of Note  Sinus Bradycardia - HR dropped to 46. BP = 123/57 with MAP = 81. Likely d/t Precedex IV infusion. Patient also on Labetalol, however, has not received Labetalol recently. HR now 54 with Precedex on Hold.   eICU Interventions  Plan:  D/C Labetalol. Precedex "holiday". Hold Precedex until HR = 60-70. Maintain sedation with Fentanyl and Ativan that is already ordered.  Restart Precedex when HR improved at a lower dose of 0-0.8 mcg/kg/hour. Titrate to RASS = 0.      Intervention Category Major Interventions: Arrhythmia - evaluation and management  [name] 09/07/2021, 4:11 AM

## 2021-09-07 NOTE — Progress Notes (Signed)
[facility] ADULT ICU REPLACEMENT PROTOCOL   The patient does apply for the [facility] Adult ICU Electrolyte Replacment Protocol based on the criteria listed below:   1.Exclusion criteria: TCTS patients, ECMO patients, and Dialysis patients 2. Is GFR >/= 30 ml/min? Yes.    Patient's GFR today is 48 3. Is SCr </= 2? Yes.   Patient's SCr is 1.32 mg/dL 4. Did SCr increase >/= 0.5 in 24 hours? No. 5.Pt's weight >40kg  Yes.   6. Abnormal electrolyte(s):  K 3.3, Mg 1.7, Phos 2.3  7. Electrolytes replaced per protocol 8.  Call MD STAT for K+ </= 2.5, Phos </= 1, or Mag </= 1 Physician:  S. Rosie Fate R Finnley [place] 09/07/2021 4:06 AM

## 2021-09-08 ENCOUNTER — Inpatient Hospital Stay (HOSPITAL_COMMUNITY): Payer: Medicaid Other

## 2021-09-08 DIAGNOSIS — C20 Malignant neoplasm of rectum: Secondary | ICD-10-CM

## 2021-09-08 DIAGNOSIS — A491 Streptococcal infection, unspecified site: Secondary | ICD-10-CM | POA: Diagnosis not present

## 2021-09-08 DIAGNOSIS — J9601 Acute respiratory failure with hypoxia: Secondary | ICD-10-CM | POA: Diagnosis not present

## 2021-09-08 DIAGNOSIS — F10931 Alcohol use, unspecified with withdrawal delirium: Secondary | ICD-10-CM | POA: Diagnosis not present

## 2021-09-08 DIAGNOSIS — Z933 Colostomy status: Secondary | ICD-10-CM

## 2021-09-08 DIAGNOSIS — M7989 Other specified soft tissue disorders: Secondary | ICD-10-CM | POA: Diagnosis not present

## 2021-09-08 LAB — CULTURE, RESPIRATORY W GRAM STAIN

## 2021-09-08 LAB — PHOSPHORUS
Phosphorus: 3.2 mg/dL (ref 2.5–4.6)
Phosphorus: 3.9 mg/dL (ref 2.5–4.6)

## 2021-09-08 LAB — BASIC METABOLIC PANEL
Anion gap: 5 (ref 5–15)
BUN: 15 mg/dL (ref 6–20)
CO2: 22 mmol/L (ref 22–32)
Calcium: 8.3 mg/dL — ABNORMAL LOW (ref 8.9–10.3)
Chloride: 114 mmol/L — ABNORMAL HIGH (ref 98–111)
Creatinine, Ser: 1.27 mg/dL — ABNORMAL HIGH (ref 0.44–1.00)
GFR, Estimated: 51 mL/min — ABNORMAL LOW (ref >60)
Glucose, Bld: 136 mg/dL — ABNORMAL HIGH (ref 70–99)
Potassium: 3.8 mmol/L (ref 3.5–5.1)
Sodium: 141 mmol/L (ref 135–145)

## 2021-09-08 LAB — CBC
HCT: 29.3 % — ABNORMAL LOW (ref 36.0–46.0)
Hemoglobin: 8.8 g/dL — ABNORMAL LOW (ref 12.0–15.0)
MCH: 28.7 pg (ref 26.0–34.0)
MCHC: 30 g/dL (ref 30.0–36.0)
MCV: 95.4 fL (ref 80.0–100.0)
Platelets: 156 10*3/uL (ref 150–400)
RBC: 3.07 MIL/uL — ABNORMAL LOW (ref 3.87–5.11)
RDW: 18.4 % — ABNORMAL HIGH (ref 11.5–15.5)
WBC: 9.3 10*3/uL (ref 4.0–10.5)
nRBC: 0 % (ref 0.0–0.2)

## 2021-09-08 LAB — GLUCOSE, CAPILLARY
Glucose-Capillary: 104 mg/dL — ABNORMAL HIGH (ref 70–99)
Glucose-Capillary: 109 mg/dL — ABNORMAL HIGH (ref 70–99)
Glucose-Capillary: 111 mg/dL — ABNORMAL HIGH (ref 70–99)
Glucose-Capillary: 122 mg/dL — ABNORMAL HIGH (ref 70–99)
Glucose-Capillary: 125 mg/dL — ABNORMAL HIGH (ref 70–99)
Glucose-Capillary: 133 mg/dL — ABNORMAL HIGH (ref 70–99)
Glucose-Capillary: 85 mg/dL (ref 70–99)

## 2021-09-08 LAB — MAGNESIUM
Magnesium: 1.9 mg/dL (ref 1.7–2.4)
Magnesium: 1.7 mg/dL (ref 1.7–2.4)

## 2021-09-08 MED ORDER — AMLODIPINE BESYLATE 10 MG PO TABS
10.0000 mg | ORAL_TABLET | Freq: Every day | ORAL | Status: DC
  Administered 2021-09-08 – 2021-09-09 (×2): 10 mg
  Filled 2021-09-08 (×2): qty 1

## 2021-09-08 MED ORDER — CHLORHEXIDINE GLUCONATE CLOTH 2 % EX PADS
6.0000 | MEDICATED_PAD | Freq: Every day | CUTANEOUS | Status: AC
  Administered 2021-09-09 (×2): 6 via TOPICAL

## 2021-09-08 MED ORDER — SODIUM CHLORIDE 0.9 % IV SOLN
2.0000 g | INTRAVENOUS | Status: AC
  Administered 2021-09-08 – 2021-09-10 (×3): 2 g via INTRAVENOUS
  Filled 2021-09-08 (×3): qty 20

## 2021-09-08 NOTE — Progress Notes (Signed)
RUE venous duplex has been completed.   Results can be found under chart review under CV PROC. 09/08/2021 1:09 PM Noel Henandez RVT, RDMS

## 2021-09-08 NOTE — Progress Notes (Signed)
NAME:  Sherry Baker, MRN:  [id number], DOB:  12/11/1965, LOS: 3 ADMISSION DATE:  09/05/2021, CONSULTATION DATE:  09/05/21 REFERRING MD:  Dr. Langston Masker, CHIEF COMPLAINT:  AMS  History of Present Illness:  55 yo F admitted to the hospital for cocaine intoxication and etoh withdrawal. Pt was found minimally responsive behind a dumpster surrounded by beer cans. In the ED the pt was exhibiting hypertension, tachycardia and reportedly intermittent agitation so received 20mg  of geodon and 2mg  of ativan. Pt was intermittently somnolent prior to and after the meds in the ED. PCCM consulted for evaluation and pt was found to not be able to protect her airway, subsequently was intubated and admitted.  Pertinent  Medical History  ETOH use disorder complicated etoh withdrawals requiring prior intubation Cocaine abuse HTN CKD3 GERD Bipolar 1 Depression Tobacco dependence Rectal cancer s/p resection, chemo, and s/p ostomy  Significant Hospital Events: Including procedures, antibiotic start and stop dates in addition to other pertinent events   11/3 Intubated in ED on, admitted 11/5 bradycardia with a heart rate dropped to 46 early a.m. felt secondary to Precedex which was stopped.  This is improving.  Changed to fentanyl gtt, scheduled librium, prn ativan, abx stopped  11/4 unasyn > 11/5 11/4 vanc  11/3 SARS/ flu > neg 11/3 RVP > neg 11/3 MRSA pcr > positive 11/4 Bcx2 >  11/4 trach asp > mod strep pna >  Interim History / Subjective:  Tmax 100.1 Received one prn dose ativan overnight, remains on fentanyl gtt 75 mcg/hr Still wakes up agitated/ restless at times, but not following commands Failed SBT this am 2/2 bradypnea/ low TVs  Objective   Blood pressure (!) 113/51, pulse 90, temperature 100.1 F (37.8 C), temperature source Oral, resp. rate 16, height 5\' 2"  (1.575 m), weight 48.4 kg, SpO2 98 %.    Vent Mode: PRVC FiO2 (%):  [30 %-40 %] 30 % Set Rate:  [22 bmp-30 bmp] 22 bmp Vt Set:   [420 mL] 420 mL PEEP:  [5 cmH20] 5 cmH20 Plateau Pressure:  [16 cmH20-18 cmH20] 16 cmH20   Intake/Output Summary (Last 24 hours) at 09/08/2021 0719 Last data filed at 09/08/2021 4825 Gross per 24 hour  Intake 4297.05 ml  Output 1205 ml  Net 3092.05 ml   Filed Weights   09/06/21 0100 09/07/21 0143 09/08/21 0319  Weight: 46.3 kg 48.3 kg 48.4 kg    Examination: General:  acute on chronically ill appearing thin adult female lying in bed in NAD, intermittently wakes up restless HEENT: MM pink/moist, ETT 7.5 at 24 at lip, OGT, pupils 3/reactive/ anicteric  Neuro:  opens eyes to verbal but does not track or follow commands, MAE, currently in bilateral soft wrist restraints CV: rr, NSR to ST, no murmur PULM:  non labored on full support, slightly coarse breath sounds, some thick tan/yellow secretions, no wheeze.  Normal pressures, flipped to PSV 12/5 with bradypnea and low TVs, placed back on full support GI: soft, bs+, colostomy LLQ/ pink stoma/ +liquid stool, foley w/cyu Extremities: warm/dry, no LE edema, some RUE edema- good pulses/ cap refill Skin: no rashes, abrasions to left arm  UOP 870ml/ 24hrs +3L/ net +5L   Labs reviewed:  CBG w/in goal, K 3.8, Cl 114, sCr 1.32-> 1.27, Mag 1.9, pending CBC Micro reviewed as above, BG ngtd, trach asp w/ mod strep pna, pending sensitives No CXR today  Resolved Hospital Problem list   Severe HTN Metabolic acidosis  Assessment & Plan:  55 yo F  w/ a hx of ETOH use disorder, complicated etoh withdrawals requiring prior intubation, Cocaine abuse, HTN, CKD3, GERD, hx of rectal cancer s/p resection w/ ostomy, Bipolar1, depression, tobacco abuse who is admitted to the hospital for cocaine intoxication and etoh withdrawal.  Acute metabolic encephalopathy likely from alcohol withdrawal with delirium tremens and cocaine abuse -Likely from drug ingestion and alcohol withdrawal with DT, intubated as unable to protect airway.  UDS +cocaine - did not  tolerate precedex 2/2 bradycardia, stopped 11/5 P: Minimize sedation as able Continue fentanyl gtt, wean as tolerated Prn ativan Scheduled librium MVI/ folate/ thiamine Abx as below which could contribute to mental status   Acute respiratory insufficiency related to above encephalopathy  Aspiration pneumonia - CXR 11/4 Nodular density seen on the right lung base suspicious for aspiration pneumonia given location and AMS vs confluence of shadows; increasing retrocardiac density - MRSA PCR positive P: Continue MV support, 8cc/kg IBW with goal Pplat <30 and DP<15  VAP prevention protocol/ PPI PAD protocol for sedation> RASS goal 0/-1 as above Wean FiO2 as able for SpO2 >92%  Daily SAT & SBT- still not tolerating SBT/ mental status remains barrier as well Trach asp with mod strep pna.  Tolerated unasyn w/pcn allergy, will start ceftriaxone pending sensitives Tylenol prn Trend fever curve and CBC CXR in am  Continue nasal Bactroban/ contact precautions, no evidence on trach asp to continue vanc  ST changes Diastolic dysfunction seen on echo -ST changes with possible lateral subendocardial endocardial injury seen on EKG. Troponins mildly elevated 24 > 54 > 66 -Echo 09/06/21 with EF 55 to 60% and grade 1 diastolic dysfunction P: Net +5L.  Stop MIVF, on TF Hold on diuresis  Tele Strict I/Os Stop NTG paste Restart home norvasc   Cocaine abuse -Positive UDS P: Supportive care Cessation education when appropriate  CKDIII -Creatinine 1.28 06/30/2021, creatinine on admission 1.71 P: Improving sCr, stable UOP Stopping MIVF Trend BMP /phos/ mag/  Strict I/Os/ daily wts   Replace electrolytes as indicated Avoid nephrotoxic agents, ensure adequate renal perfusion  History of rectal cancer s/p resection w/ ostomy P: Routine ostomy care  GERD P: PPI  Bipolar 1 disorder -S/p ziprasidone 20 mg in ED for agitation P: Supportive care Consider psych consult when awake and able  to interact  Tobacco use disorder P: Cessation education when appropriate  Hypokalemia Hypophosphatemia P: Supplement as needed  Thrombocytopenia P:  Plts noted 176 > 128 11/5, Pending CBC today No evidence of bleeding, H/H/ stable  RUE swelling P:  Mild, will d/c axillary aline- no indication to continue.  Good cap refill/ pulses.  If diminished will get arterial doppler.  Elevate   Best Practice (right click and "Reselect all SmartList Selections" daily)   Diet/type: tubefeeds and NPO DVT prophylaxis: prophylactic heparin  GI prophylaxis: PPI Lines: N/A Foley:  Yes, and it is still needed Code Status:  full code Last date of multidisciplinary goals of care discussion: pending  Critical care time:   CRITICAL CARE Performed by: Kennieth Rad  Total critical care time: 35 minutes  Critical care time was exclusive of separately billable procedures and treating other patients.  Critical care was necessary to treat or prevent imminent or life-threatening deterioration.  Critical care was time spent personally by me on the following activities: development of treatment plan with patient and/or surrogate as well as nursing, discussions with consultants, evaluation of patient's response to treatment, examination of patient, obtaining history from patient or surrogate, ordering and performing treatments  and interventions, ordering and review of laboratory studies, ordering and review of radiographic studies, pulse oximetry and re-evaluation of patient's condition.     Kennieth Rad, ACNP  Pulmonary & Critical Care 09/08/2021, 7:19 AM  See Amion for pager If no response to pager, please call PCCM consult pager After 7:00 pm call Elink

## 2021-09-09 ENCOUNTER — Inpatient Hospital Stay (HOSPITAL_COMMUNITY): Payer: Medicaid Other

## 2021-09-09 DIAGNOSIS — G9341 Metabolic encephalopathy: Secondary | ICD-10-CM

## 2021-09-09 LAB — CBC
HCT: 28.9 % — ABNORMAL LOW (ref 36.0–46.0)
Hemoglobin: 8.8 g/dL — ABNORMAL LOW (ref 12.0–15.0)
MCH: 28.7 pg (ref 26.0–34.0)
MCHC: 30.4 g/dL (ref 30.0–36.0)
MCV: 94.1 fL (ref 80.0–100.0)
Platelets: 149 10*3/uL — ABNORMAL LOW (ref 150–400)
RBC: 3.07 MIL/uL — ABNORMAL LOW (ref 3.87–5.11)
RDW: 18.1 % — ABNORMAL HIGH (ref 11.5–15.5)
WBC: 5.8 10*3/uL (ref 4.0–10.5)
nRBC: 0 % (ref 0.0–0.2)

## 2021-09-09 LAB — BASIC METABOLIC PANEL
Anion gap: 4 — ABNORMAL LOW (ref 5–15)
BUN: 15 mg/dL (ref 6–20)
CO2: 24 mmol/L (ref 22–32)
Calcium: 8.3 mg/dL — ABNORMAL LOW (ref 8.9–10.3)
Chloride: 112 mmol/L — ABNORMAL HIGH (ref 98–111)
Creatinine, Ser: 1.48 mg/dL — ABNORMAL HIGH (ref 0.44–1.00)
GFR, Estimated: 42 mL/min — ABNORMAL LOW (ref >60)
Glucose, Bld: 115 mg/dL — ABNORMAL HIGH (ref 70–99)
Potassium: 4.2 mmol/L (ref 3.5–5.1)
Sodium: 140 mmol/L (ref 135–145)

## 2021-09-09 LAB — MAGNESIUM: Magnesium: 1.9 mg/dL (ref 1.7–2.4)

## 2021-09-09 LAB — GLUCOSE, CAPILLARY
Glucose-Capillary: 103 mg/dL — ABNORMAL HIGH (ref 70–99)
Glucose-Capillary: 103 mg/dL — ABNORMAL HIGH (ref 70–99)
Glucose-Capillary: 106 mg/dL — ABNORMAL HIGH (ref 70–99)
Glucose-Capillary: 109 mg/dL — ABNORMAL HIGH (ref 70–99)
Glucose-Capillary: 87 mg/dL (ref 70–99)

## 2021-09-09 LAB — PHOSPHORUS: Phosphorus: 4.6 mg/dL (ref 2.5–4.6)

## 2021-09-09 MED ORDER — THIAMINE HCL 100 MG PO TABS
100.0000 mg | ORAL_TABLET | Freq: Every day | ORAL | Status: DC
  Administered 2021-09-10 – 2021-09-14 (×4): 100 mg
  Filled 2021-09-09 (×4): qty 1

## 2021-09-09 MED ORDER — FUROSEMIDE 10 MG/ML IJ SOLN
40.0000 mg | Freq: Once | INTRAMUSCULAR | Status: AC
  Administered 2021-09-09: 40 mg via INTRAVENOUS
  Filled 2021-09-09: qty 4

## 2021-09-09 MED ORDER — PANTOPRAZOLE 2 MG/ML SUSPENSION
40.0000 mg | Freq: Every day | ORAL | Status: DC
  Administered 2021-09-09 – 2021-09-19 (×10): 40 mg
  Filled 2021-09-09 (×11): qty 20

## 2021-09-09 NOTE — Progress Notes (Signed)
[facility] Progress Note Patient Name: Sherry Baker DOB: [birth year] MRN: [id number]   Date of Service  09/09/2021  HPI/Events of Note  Patient is intubated and at high risk for self extubation.  eICU Interventions  Bilateral wrist restraints renewed.        Kerry Kass Sarena Jezek 09/09/2021, 9:24 PM

## 2021-09-09 NOTE — Progress Notes (Signed)
NAME:  Sherry Baker, MRN:  [id number], DOB:  [birth year], LOS: 4 ADMISSION DATE:  09/05/2021, CONSULTATION DATE:  09/05/21 REFERRING MD:  Dr. Langston Masker, CHIEF COMPLAINT:  AMS  History of Present Illness:  55 yo F admitted to the hospital for cocaine intoxication and etoh withdrawal. Pt was found minimally responsive behind a dumpster surrounded by beer cans. In the ED the pt was exhibiting hypertension, tachycardia and reportedly intermittent agitation so received 20mg  of geodon and 2mg  of ativan. Pt was intermittently somnolent prior to and after the meds in the ED. PCCM consulted for evaluation and pt was found to not be able to protect her airway, subsequently was intubated and admitted.  Pertinent  Medical History  ETOH use disorder complicated etoh withdrawals requiring prior intubation Cocaine abuse HTN CKD3 GERD Bipolar 1 Depression Tobacco dependence Rectal cancer s/p resection, chemo, and s/p ostomy  Significant Hospital Events: Including procedures, antibiotic start and stop dates in addition to other pertinent events   11/3 Intubated in ED on, admitted 11/5 bradycardia with a heart rate dropped to 46 early a.m. felt secondary to Precedex which was stopped.  This is improving.  Changed to fentanyl gtt, scheduled librium, prn ativan, abx stopped  11/4 unasyn > 11/5 11/4 vanc  11/3 SARS/ flu > neg 11/3 RVP > neg 11/3 MRSA pcr > positive 11/4 Bcx2 >  11/4 trach asp > mod strep pna  11/6 switched to CTX; vanc and Unasyn discontinued  Interim History / Subjective:  Fever resolved, Tmax 99.3 Received one prn dose ativan overnight, fentanyl gtt discontinued 0700 Still wakes up agitated/ restless at times, but not following commands Failed SBT this am  Objective   Blood pressure 131/65, pulse 80, temperature 98.2 F (36.8 C), temperature source Axillary, resp. rate (!) 22, height 5\' 2"  (1.575 m), weight 50.4 kg, SpO2 100 %.    Vent Mode: PRVC FiO2 (%):  [30 %] 30 % Set  Rate:  [20 bmp-22 bmp] 22 bmp Vt Set:  [420 mL] 420 mL PEEP:  [5 cmH20] 5 cmH20 Pressure Support:  [10 cmH20] 10 cmH20 Plateau Pressure:  [15 cmH20-16 cmH20] 16 cmH20   Intake/Output Summary (Last 24 hours) at 09/09/2021 0755 Last data filed at 09/09/2021 0600 Gross per 24 hour  Intake 1771.18 ml  Output 1145 ml  Net 626.18 ml    Filed Weights   09/07/21 0143 09/08/21 0319 09/09/21 0450  Weight: 48.3 kg 48.4 kg 50.4 kg    Examination: General:  acute on chronically ill appearing thin adult female lying in bed in NAD, intermittently wakes up restless HEENT: MM pink/moist, ETT at lip, OGT, PERRL, anicteric  Neuro:  does not track or follow commands, currently in bilateral soft wrist restraints CV: RRR no murmur PULM:  CTAB, no respiratory distress on ventilator GI: soft, non-tender, +BS Extremities: warm/dry, no lower extremity edema Skin: no rashes, abrasions to left arm  UOP 955 ml/ 24hrs +0.6L/ net +5.6L   Labs reviewed:  CBG within goal, K 4.2, Cl 112, slight rise in Cr, stable anemia, no leukocytosis Trach asp w/ mod strep pneumo sensitive to CTX  Resolved Hospital Problem list   Severe HTN Metabolic acidosis  Assessment & Plan:  55 yo F w/ a hx of ETOH use disorder, complicated etoh withdrawals requiring prior intubation, Cocaine abuse, HTN, CKD3, GERD, hx of rectal cancer s/p resection w/ ostomy, Bipolar1, depression, tobacco abuse who is admitted to the hospital for cocaine intoxication and etoh withdrawal.  Acute metabolic encephalopathy likely  from alcohol withdrawal with delirium tremens and cocaine abuse Likely from drug ingestion and alcohol withdrawal with DT, intubated as unable to protect airway.  UDS +cocaine - did not tolerate precedex 2/2 bradycardia, stopped 11/5 - Minimize sedation as able - fentanyl gtt discontinued this AM, continue prn fentanyl - ativan prn per CIWA - Scheduled librium - MVI/ folate/ thiamine - Abx as below which could contribute  to mental status   Acute respiratory insufficiency related to above encephalopathy  Aspiration pneumonia secondary to S. pneumoniae CXR 11/4 Nodular density seen on the right lung base suspicious for aspiration pneumonia given location and AMS vs confluence of shadows; increasing retrocardiac density Tracheal aspirate positive for Strep pneumo Failed SBT this AM, will diurese and re-attempt this afternoon for possible extubation - Continue MV support, 8cc/kg IBW with goal Pplat <30 and DP<15  - VAP prevention protocol/ PPI - PAD protocol for sedation> RASS goal 0/-1 as above - Wean FiO2 as able for SpO2 >92%  - Daily SAT & SBT- still not tolerating SBT/ mental status remains barrier as well - continue CTX x 5 days - Tylenol prn - Trend fever curve and CBC  ST changes Diastolic dysfunction seen on echo -ST changes with possible lateral subendocardial endocardial injury seen on EKG. Troponins mildly elevated 24 > 54 > 66 -Echo 09/06/21 with EF 55 to 60% and grade 1 diastolic dysfunction P: Hold on diuresis  Tele Strict I/Os Home amlodipine restarted  Cocaine abuse -Positive UDS P: Supportive care Cessation education when appropriate  CKDIII -Creatinine 1.28 06/30/2021, creatinine on admission 1.71 P: Improving sCr, stable UOP Stopping MIVF Trend BMP /phos/ mag/  Strict I/Os/ daily wts   Replace electrolytes as indicated Avoid nephrotoxic agents, ensure adequate renal perfusion  History of rectal cancer s/p resection w/ ostomy P: Routine ostomy care  GERD P: PPI  Bipolar 1 disorder -S/p ziprasidone 20 mg in ED for agitation P: Supportive care Consider psych consult when awake and able to interact  Tobacco use disorder P: Cessation education when appropriate  Thrombocytopenia Mild, platelets 149 today - monitor CBC  RUE swelling DVT US reveals superficial vein thrombosis Axillary line was discontinued  Best Practice (right click and "Reselect all  SmartList Selections" daily)   Diet/type: tubefeeds and NPO DVT prophylaxis: prophylactic heparin  GI prophylaxis: PPI Lines: N/A Foley:  Yes, and it is still needed  Code Status:  full code Last date of multidisciplinary goals of care discussion: pending  Critical care time:    Zola Button, MD [place], PGY-2   See Amion for pager If no response to pager, please call PCCM consult pager After 7:00 pm call Elink

## 2021-09-10 DIAGNOSIS — G9341 Metabolic encephalopathy: Secondary | ICD-10-CM | POA: Diagnosis not present

## 2021-09-10 LAB — BASIC METABOLIC PANEL
Anion gap: 9 (ref 5–15)
BUN: 17 mg/dL (ref 6–20)
CO2: 24 mmol/L (ref 22–32)
Calcium: 8.5 mg/dL — ABNORMAL LOW (ref 8.9–10.3)
Chloride: 105 mmol/L (ref 98–111)
Creatinine, Ser: 1.39 mg/dL — ABNORMAL HIGH (ref 0.44–1.00)
GFR, Estimated: 45 mL/min — ABNORMAL LOW (ref >60)
Glucose, Bld: 102 mg/dL — ABNORMAL HIGH (ref 70–99)
Potassium: 3.7 mmol/L (ref 3.5–5.1)
Sodium: 138 mmol/L (ref 135–145)

## 2021-09-10 LAB — CBC
HCT: 29.4 % — ABNORMAL LOW (ref 36.0–46.0)
Hemoglobin: 9 g/dL — ABNORMAL LOW (ref 12.0–15.0)
MCH: 28.5 pg (ref 26.0–34.0)
MCHC: 30.6 g/dL (ref 30.0–36.0)
MCV: 93 fL (ref 80.0–100.0)
Platelets: 168 10*3/uL (ref 150–400)
RBC: 3.16 MIL/uL — ABNORMAL LOW (ref 3.87–5.11)
RDW: 17.4 % — ABNORMAL HIGH (ref 11.5–15.5)
WBC: 3.3 10*3/uL — ABNORMAL LOW (ref 4.0–10.5)
nRBC: 0 % (ref 0.0–0.2)

## 2021-09-10 LAB — GLUCOSE, CAPILLARY
Glucose-Capillary: 93 mg/dL (ref 70–99)
Glucose-Capillary: 110 mg/dL — ABNORMAL HIGH (ref 70–99)
Glucose-Capillary: 88 mg/dL (ref 70–99)
Glucose-Capillary: 113 mg/dL — ABNORMAL HIGH (ref 70–99)
Glucose-Capillary: 120 mg/dL — ABNORMAL HIGH (ref 70–99)

## 2021-09-10 MED ORDER — BUSPIRONE HCL 15 MG PO TABS
15.0000 mg | ORAL_TABLET | Freq: Two times a day (BID) | ORAL | Status: DC
  Administered 2021-09-10 – 2021-09-20 (×19): 15 mg
  Filled 2021-09-10 (×19): qty 1

## 2021-09-10 MED ORDER — QUETIAPINE FUMARATE 50 MG PO TABS
25.0000 mg | ORAL_TABLET | Freq: Two times a day (BID) | ORAL | Status: DC
  Administered 2021-09-10 – 2021-09-18 (×15): 25 mg
  Filled 2021-09-10 (×15): qty 1

## 2021-09-10 MED ORDER — CHLORHEXIDINE GLUCONATE CLOTH 2 % EX PADS
6.0000 | MEDICATED_PAD | Freq: Every day | CUTANEOUS | Status: DC
  Administered 2021-09-10 – 2021-09-13 (×4): 6 via TOPICAL

## 2021-09-10 NOTE — Progress Notes (Signed)
Seroquel added

## 2021-09-10 NOTE — Progress Notes (Signed)
NAME:  Sherry Baker, MRN:  [id number], DOB:  January 08, 1966, LOS: 5 ADMISSION DATE:  09/05/2021, CONSULTATION DATE:  09/05/21 REFERRING MD:  Dr. Langston Masker, CHIEF COMPLAINT:  AMS  History of Present Illness:  56 yo F admitted to the hospital for cocaine intoxication and etoh withdrawal. Pt was found minimally responsive behind a dumpster surrounded by beer cans. In the ED the pt was exhibiting hypertension, tachycardia and reportedly intermittent agitation so received 20mg  of geodon and 2mg  of ativan. Pt was intermittently somnolent prior to and after the meds in the ED. PCCM consulted for evaluation and pt was found to not be able to protect her airway, subsequently was intubated and admitted.  Pertinent  Medical History  ETOH use disorder complicated etoh withdrawals requiring prior intubation Cocaine abuse HTN CKD3 GERD Bipolar 1 Depression Tobacco dependence Rectal cancer s/p resection, chemo, and s/p ostomy  Significant Hospital Events: Including procedures, antibiotic start and stop dates in addition to other pertinent events   11/3 Intubated in ED on, admitted 11/5 bradycardia with a heart rate dropped to 46 early a.m. felt secondary to Precedex which was stopped.  This is improving.  Changed to fentanyl gtt, scheduled librium, prn ativan, abx stopped  11/4 unasyn > 11/5 11/4 vanc  11/3 SARS/ flu > neg 11/3 RVP > neg 11/3 MRSA pcr > positive 11/4 Bcx2 >  11/4 trach asp > mod strep pna  11/6 switched to CTX; vanc and Unasyn discontinued 11/7 failed SBT  Interim History / Subjective:  Afebrile Fentanyl gtt weaned to 75 mcg/hr Soft BP and intermittent bradycardia in the past 24 hours Calmer today but still wakes up agitated/ restless at times, following commands intermittently Failed SBT this am 1 dose of lorazepam given past 24 hours  Objective   Blood pressure (!) 109/49, pulse (!) 59, temperature 97.6 F (36.4 C), temperature source Oral, resp. rate (!) 22, height 5\' 2"   (1.575 m), weight 51.2 kg, SpO2 100 %.    Vent Mode: PRVC FiO2 (%):  [30 %] 30 % Set Rate:  [10 bmp-22 bmp] 10 bmp Vt Set:  [420 mL] 420 mL PEEP:  [5 cmH20] 5 cmH20 Plateau Pressure:  [14 cmH20-17 cmH20] 14 cmH20   Intake/Output Summary (Last 24 hours) at 09/10/2021 0750 Last data filed at 09/10/2021 0600 Gross per 24 hour  Intake 1322.81 ml  Output 3629 ml  Net -2306.19 ml    Filed Weights   09/08/21 0319 09/09/21 0450 09/10/21 0426  Weight: 48.4 kg 50.4 kg 51.2 kg    Examination: General:  acute on chronically ill appearing thin adult female lying in bed in NAD, intermittently wakes up restless HEENT: MM pink/moist, ETT at lip, OGT, PERRL, anicteric  Neuro:  opens eyes to voice, not following commands currently, currently in bilateral soft wrist restraints CV: RRR no murmur PULM:  CTAB, no respiratory distress on ventilator GI: soft, non-tender, +BS Extremities: warm/dry, no lower extremity edema Skin: no rashes, abrasions to left arm  UOP 2.7L / 24hrs net +3.3L   Labs reviewed:  CBG within goal, Cr at baseline, stable anemia, mild leukopenia, platelets 168  Resolved Hospital Problem list   Severe HTN Metabolic acidosis Thrombocytopenia  Assessment & Plan:  55 yo F w/ a hx of ETOH use disorder, complicated etoh withdrawals requiring prior intubation, Cocaine abuse, HTN, CKD3, GERD, hx of rectal cancer s/p resection w/ ostomy, Bipolar1, depression, tobacco abuse who is admitted to the hospital for cocaine intoxication and etoh withdrawal.  Acute metabolic encephalopathy  likely from alcohol withdrawal with delirium tremens and cocaine abuse Likely from drug ingestion and alcohol withdrawal with DT, intubated as unable to protect airway.  UDS +cocaine - did not tolerate precedex 2/2 bradycardia, stopped 11/5 - Minimize sedation as able - fentanyl gtt discontinued this AM, continue prn fentanyl - ativan prn per CIWA - Scheduled librium - MVI/ folate/ thiamine - Abx as  below which could contribute to mental status   Acute respiratory insufficiency related to above encephalopathy  Aspiration pneumonia secondary to S. pneumoniae CXR 11/4 Nodular density seen on the right lung base suspicious for aspiration pneumonia given location and AMS vs confluence of shadows; increasing retrocardiac density Tracheal aspirate positive for Strep pneumo Failed SBT this AM, decreased rate to 10 to promote spontaneous respirations - hopeful to attempt extubation later today if more alert - Continue MV support, 8cc/kg IBW with goal Pplat <30 and DP<15  - VAP prevention protocol/ PPI - PAD protocol for sedation> RASS goal 0/-1 as above - Wean FiO2 as able for SpO2 >92%  - Daily SAT & SBT- still not tolerating SBT/ mental status remains barrier as well - continue CTX x 5 days - Tylenol prn - Trend fever curve and CBC  ST changes Diastolic dysfunction seen on echo History of HTN -ST changes with possible lateral subendocardial endocardial injury seen on EKG. Troponins mildly elevated 24 > 54 > 66 -Echo 09/06/21 with EF 55 to 60% and grade 1 diastolic dysfunction Soft BP in the past 24 hours likely from fentanyl P: Tele Strict I/Os Hold amlodipine  Cocaine abuse -Positive UDS P: Supportive care Cessation education when appropriate  CKDIII -Creatinine 1.28 06/30/2021, creatinine on admission 1.71 P: Improving sCr, stable UOP Trend BMP /phos/ mag Strict I/Os/ daily wts   Replace electrolytes as indicated Avoid nephrotoxic agents, ensure adequate renal perfusion  History of rectal cancer s/p resection w/ ostomy P: Routine ostomy care  GERD P: PPI  Bipolar 1 disorder -S/p ziprasidone 20 mg in ED for agitation P: Supportive care Consider psych consult when awake and able to interact  Tobacco use disorder P: Cessation education when appropriate  RUE swelling DVT US reveals superficial vein thrombosis Axillary line was discontinued  Best Practice  (right click and "Reselect all SmartList Selections" daily)   Diet/type: tubefeeds and NPO DVT prophylaxis: prophylactic heparin  GI prophylaxis: PPI Lines: N/A Foley:  N/A  Code Status:  full code Last date of multidisciplinary goals of care discussion: pending  Critical care time:    Zola Button, MD [place], PGY-2   See Amion for pager If no response to pager, please call PCCM consult pager After 7:00 pm call Elink

## 2021-09-10 NOTE — TOC Initial Note (Signed)
Transition of Care [facility]) - Initial/Assessment Note    Patient Details  Name: Sherry Baker MRN: [id number] Date of Birth: 24-Jun-1966  Transition of Care [facility]) CM/SW Contact:    Tom-Johnson, Renea Ee, RN Phone Number: 09/10/2021, 4:42 PM  Clinical Narrative:                 CM unable to assess patient at bedtime due to patient is intubated and on vent support. CM called Sunday Spillers, patient's contact to get basic idea about patient and how to help facilitate care at discharge. Crystal states patient is her cousin and patient lives with her boyfriend, [name]. States patient has been a drug addict for a long time and her two children were taken from her by DSS.  States patient's parents are deceased. Patient is on disability and does not drive self. States patient goes to her appointment by public transports or friends and Crystal transports. CM will do further assessment when patient is stable and off vent. CM will continue to follow with needs.      Barriers to Discharge: Continued Medical Work up   Patient Goals and CMS Choice        Expected Discharge Plan and Services     Discharge Planning Services: CM Consult   Living arrangements for the past 2 months: Apartment                                      Prior Living Arrangements/Services Living arrangements for the past 2 months: Apartment Lives with:: Other (Comment) (Boyfriend) Patient language and need for interpreter reviewed:: Yes Do you feel safe going back to the place where you live?: Yes      Need for Family Participation in Patient Care: Yes (Comment) Care giver support system in place?: Yes (comment)   Criminal Activity/Legal Involvement Pertinent to Current Situation/Hospitalization: No - Comment as needed  Activities of Daily Living      Permission Sought/Granted Permission sought to share information with : Case Manager, Family Supports Permission granted to share information with :  Yes, Verbal Permission Granted Civil Service fast streamer, contact)              Emotional Assessment Appearance:: Appears stated age Attitude/Demeanor/Rapport: Unable to Assess (On vent) Affect (typically observed): Unable to Assess (On vent) Orientation: : Oriented to Self Alcohol / Substance Use: Alcohol Use, Illicit Drugs    Admission diagnosis:  Alcohol withdrawal ([place]) [F10.939] Encounter for intubation [Z01.818] Acute alcohol intoxication, with delirium ([place]) [F10.921] Patient Active Problem List   Diagnosis Date Noted   Underweight 06/25/2021   Protein-calorie malnutrition, severe 06/25/2021   Homelessness 06/23/2021   Toxic encephalopathy 65/78/4696   Alcoholic cirrhosis of liver without ascites ([place]) 06/23/2021   COVID-19 virus infection 06/23/2021   Delirium tremens ([place]) 06/22/2021   Altered mental status 03/29/2021   Alcohol withdrawal ([place]) 03/28/2021   Alcohol abuse 06/14/2019   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   Normochromic normocytic anemia 06/06/2019   Pressure injury of skin 06/06/2019   Substance abuse ([place])    Seizure ([place]) 05/21/2019   Acute encephalopathy 05/18/2019   Acute lower UTI 05/18/2019   Polysubstance abuse ([place]) 05/18/2019   Wound dehiscence 07/10/2017   Genetic testing 03/24/2017   Hypertension 02/18/2017   GERD (gastroesophageal reflux disease) 02/18/2017   Rectal cancer ([place]) 01/27/2017   Abdominal pain, epigastric    Rectal mass  Fever    Anemia 01/16/2017   Abdominal pain    Constipation    Alcohol abuse    Tobacco abuse    Cocaine abuse ([place])    Bipolar affective disorder (H[facility])    Severe malnutrition ([place]) 02/13/2014   Suicide ideation 02/12/2014   Suicidal ideation 02/11/2014   Alcohol intoxication ([place]) 02/11/2014   Transaminitis 02/11/2014   Substance abuse ([place]) 02/11/2014   Depression 02/11/2014   Thrombocytopenia ([place]) 02/11/2014   PCP:  Default, Provider, MD Pharmacy:   [facility] [place], [place] [address] Phone: [phone] Fax: [phone]     Social Determinants of Health (SDOH) Interventions    Readmission Risk Interventions No flowsheet data found.

## 2021-09-11 DIAGNOSIS — G9341 Metabolic encephalopathy: Secondary | ICD-10-CM | POA: Diagnosis not present

## 2021-09-11 DIAGNOSIS — J9601 Acute respiratory failure with hypoxia: Secondary | ICD-10-CM | POA: Diagnosis not present

## 2021-09-11 LAB — BASIC METABOLIC PANEL
Anion gap: 8 (ref 5–15)
BUN: 19 mg/dL (ref 6–20)
CO2: 28 mmol/L (ref 22–32)
Calcium: 8.7 mg/dL — ABNORMAL LOW (ref 8.9–10.3)
Chloride: 101 mmol/L (ref 98–111)
Creatinine, Ser: 1.55 mg/dL — ABNORMAL HIGH (ref 0.44–1.00)
GFR, Estimated: 40 mL/min — ABNORMAL LOW (ref >60)
Glucose, Bld: 126 mg/dL — ABNORMAL HIGH (ref 70–99)
Potassium: 4.2 mmol/L (ref 3.5–5.1)
Sodium: 137 mmol/L (ref 135–145)

## 2021-09-11 LAB — POCT I-STAT 7, (LYTES, BLD GAS, ICA,H+H)
Acid-Base Excess: 3 mmol/L — ABNORMAL HIGH (ref 0.0–2.0)
Acid-Base Excess: 4 mmol/L — ABNORMAL HIGH (ref 0.0–2.0)
Bicarbonate: 30.5 mmol/L — ABNORMAL HIGH (ref 20.0–28.0)
Bicarbonate: 29.7 mmol/L — ABNORMAL HIGH (ref 20.0–28.0)
Calcium, Ion: 1.28 mmol/L (ref 1.15–1.40)
Calcium, Ion: 1.3 mmol/L (ref 1.15–1.40)
HCT: 26 % — ABNORMAL LOW (ref 36.0–46.0)
HCT: 24 % — ABNORMAL LOW (ref 36.0–46.0)
Hemoglobin: 8.8 g/dL — ABNORMAL LOW (ref 12.0–15.0)
Hemoglobin: 8.2 g/dL — ABNORMAL LOW (ref 12.0–15.0)
O2 Saturation: 95 %
O2 Saturation: 98 %
Patient temperature: 97.6
Patient temperature: 98.7
Potassium: 4.3 mmol/L (ref 3.5–5.1)
Potassium: 4 mmol/L (ref 3.5–5.1)
Sodium: 140 mmol/L (ref 135–145)
Sodium: 140 mmol/L (ref 135–145)
TCO2: 32 mmol/L (ref 22–32)
TCO2: 31 mmol/L (ref 22–32)
pCO2 arterial: 64.3 mmHg — ABNORMAL HIGH (ref 32.0–48.0)
pCO2 arterial: 53.7 mmHg — ABNORMAL HIGH (ref 32.0–48.0)
pH, Arterial: 7.281 — ABNORMAL LOW (ref 7.350–7.450)
pH, Arterial: 7.351 (ref 7.350–7.450)
pO2, Arterial: 84 mmHg (ref 83.0–108.0)
pO2, Arterial: 104 mmHg (ref 83.0–108.0)

## 2021-09-11 LAB — GLUCOSE, CAPILLARY
Glucose-Capillary: 101 mg/dL — ABNORMAL HIGH (ref 70–99)
Glucose-Capillary: 109 mg/dL — ABNORMAL HIGH (ref 70–99)
Glucose-Capillary: 122 mg/dL — ABNORMAL HIGH (ref 70–99)
Glucose-Capillary: 135 mg/dL — ABNORMAL HIGH (ref 70–99)
Glucose-Capillary: 63 mg/dL — ABNORMAL LOW (ref 70–99)
Glucose-Capillary: 83 mg/dL (ref 70–99)
Glucose-Capillary: 88 mg/dL (ref 70–99)
Glucose-Capillary: 91 mg/dL (ref 70–99)
Glucose-Capillary: 91 mg/dL (ref 70–99)

## 2021-09-11 LAB — CBC WITH DIFFERENTIAL/PLATELET
Abs Immature Granulocytes: 0.1 10*3/uL — ABNORMAL HIGH (ref 0.00–0.07)
Basophils Absolute: 0 10*3/uL (ref 0.0–0.1)
Basophils Relative: 0 %
Eosinophils Absolute: 0.1 10*3/uL (ref 0.0–0.5)
Eosinophils Relative: 2 %
HCT: 27.3 % — ABNORMAL LOW (ref 36.0–46.0)
Hemoglobin: 8.3 g/dL — ABNORMAL LOW (ref 12.0–15.0)
Immature Granulocytes: 3 %
Lymphocytes Relative: 13 %
Lymphs Abs: 0.5 10*3/uL — ABNORMAL LOW (ref 0.7–4.0)
MCH: 28.6 pg (ref 26.0–34.0)
MCHC: 30.4 g/dL (ref 30.0–36.0)
MCV: 94.1 fL (ref 80.0–100.0)
Monocytes Absolute: 0.7 10*3/uL (ref 0.1–1.0)
Monocytes Relative: 17 %
Neutro Abs: 2.6 10*3/uL (ref 1.7–7.7)
Neutrophils Relative %: 65 %
Platelets: 177 10*3/uL (ref 150–400)
RBC: 2.9 MIL/uL — ABNORMAL LOW (ref 3.87–5.11)
RDW: 17.3 % — ABNORMAL HIGH (ref 11.5–15.5)
WBC: 3.9 10*3/uL — ABNORMAL LOW (ref 4.0–10.5)
nRBC: 0 % (ref 0.0–0.2)

## 2021-09-11 LAB — CULTURE, BLOOD (ROUTINE X 2)
Culture: NO GROWTH
Culture: NO GROWTH
Special Requests: ADEQUATE

## 2021-09-11 LAB — MAGNESIUM: Magnesium: 2.5 mg/dL — ABNORMAL HIGH (ref 1.7–2.4)

## 2021-09-11 LAB — PHOSPHORUS: Phosphorus: 6 mg/dL — ABNORMAL HIGH (ref 2.5–4.6)

## 2021-09-11 MED ORDER — DEXTROSE 50 % IV SOLN
INTRAVENOUS | Status: AC
  Filled 2021-09-11: qty 50

## 2021-09-11 MED ORDER — CHLORDIAZEPOXIDE HCL 25 MG PO CAPS
25.0000 mg | ORAL_CAPSULE | Freq: Every day | ORAL | Status: AC
  Administered 2021-09-12 – 2021-09-14 (×2): 25 mg

## 2021-09-11 MED ORDER — DEXTROSE 50 % IV SOLN
12.5000 g | INTRAVENOUS | Status: AC
  Administered 2021-09-11: 12.5 g via INTRAVENOUS

## 2021-09-11 MED ORDER — DEXTROSE IN LACTATED RINGERS 5 % IV SOLN: INTRAVENOUS | Status: AC

## 2021-09-11 MED ORDER — CHLORDIAZEPOXIDE HCL 25 MG PO CAPS
25.0000 mg | ORAL_CAPSULE | Freq: Two times a day (BID) | ORAL | Status: AC
  Administered 2021-09-13: 25 mg
  Filled 2021-09-11 (×3): qty 1

## 2021-09-11 MED ORDER — CHLORDIAZEPOXIDE HCL 25 MG PO CAPS
25.0000 mg | ORAL_CAPSULE | Freq: Three times a day (TID) | ORAL | Status: AC
  Administered 2021-09-12: 25 mg
  Filled 2021-09-11: qty 1

## 2021-09-11 MED ORDER — CHLORDIAZEPOXIDE HCL 25 MG PO CAPS
25.0000 mg | ORAL_CAPSULE | Freq: Four times a day (QID) | ORAL | Status: AC
  Administered 2021-09-11 (×4): 25 mg
  Filled 2021-09-11 (×3): qty 1

## 2021-09-11 MED ORDER — BETHANECHOL CHLORIDE 5 MG PO TABS
5.0000 mg | ORAL_TABLET | Freq: Three times a day (TID) | ORAL | Status: DC
Start: 2021-09-11 — End: 2021-09-12
  Administered 2021-09-11 – 2021-09-12 (×3): 5 mg
  Filled 2021-09-11 (×5): qty 1

## 2021-09-11 NOTE — Progress Notes (Signed)
[place] Progress Note Patient Name: Sherry Baker DOB: Mar 30, 1966 MRN: [id number]   Date of Service  09/11/2021  HPI/Events of Note  ABG reviewed.  eICU Interventions  Respiratory rate on the ventilator increased to 20.        Kerry Kass Shelma Eiben 09/11/2021, 3:38 AM

## 2021-09-11 NOTE — Progress Notes (Signed)
NAME:  Sherry Baker, MRN:  [id number], DOB:  1966/10/10, LOS: 6 ADMISSION DATE:  09/05/2021, CONSULTATION DATE:  09/05/21 REFERRING MD:  Dr. Langston Masker, CHIEF COMPLAINT:  AMS  History of Present Illness:  55 yo F admitted to the hospital for cocaine intoxication and etoh withdrawal. Pt was found minimally responsive behind a dumpster surrounded by beer cans. In the ED the pt was exhibiting hypertension, tachycardia and reportedly intermittent agitation so received 20mg  of geodon and 2mg  of ativan. Pt was intermittently somnolent prior to and after the meds in the ED. PCCM consulted for evaluation and pt was found to not be able to protect her airway, subsequently was intubated and admitted.  Pertinent  Medical History  ETOH use disorder complicated etoh withdrawals requiring prior intubation Cocaine abuse HTN CKD3 GERD Bipolar 1 Depression Tobacco dependence Rectal cancer s/p resection, chemo, and s/p ostomy  Significant Hospital Events: Including procedures, antibiotic start and stop dates in addition to other pertinent events   11/3 Intubated in ED on, admitted 11/5 bradycardia with a heart rate dropped to 46 early a.m. felt secondary to Precedex which was stopped.  This is improving.  Changed to fentanyl gtt, scheduled librium, prn ativan, abx stopped  11/4 unasyn > 11/5 11/4 vanc  11/3 SARS/ flu > neg 11/3 RVP > neg 11/3 MRSA pcr > positive 11/4 Bcx2 >  11/4 trach asp > mod strep pna  11/6 switched to CTX; vanc and Unasyn discontinued 11/7 failed SBT  Interim History / Subjective:  Febrile to 101.55F overnight Fentanyl gtt weaned to 32 mcg/hr [place] today but still wakes up agitated/ restless at times, following commands intermittently Failed SBT this am pH 7.28 on ABG overnight, vent rate increased back to 20  Objective   Blood pressure (!) 150/58, pulse 76, temperature 98.3 F (36.8 C), temperature source Axillary, resp. rate 11, height 5\' 2"  (1.575 m), weight 51.3 kg,  SpO2 92 %.    Vent Mode: PRVC FiO2 (%):  [30 %] 30 % Set Rate:  [10 bmp-20 bmp] 20 bmp Vt Set:  [420 mL] 420 mL PEEP:  [5 cmH20] 5 cmH20 Plateau Pressure:  [10 cmH20-16 cmH20] 16 cmH20   Intake/Output Summary (Last 24 hours) at 09/11/2021 0936 Last data filed at 09/11/2021 0700 Gross per 24 hour  Intake 1348.4 ml  Output 720 ml  Net 628.4 ml    Filed Weights   09/09/21 0450 09/10/21 0426 09/11/21 0500  Weight: 50.4 kg 51.2 kg 51.3 kg    Examination: General:  acute on chronically ill appearing thin adult female lying in bed in NAD, intermittently wakes up restless HEENT: MM pink/moist, ETT at lip, OGT, PERRL, anicteric  Neuro:  opens eyes to voice, not following commands currently, in bilateral soft wrist restraints CV: RRR no murmur PULM:  CTAB, no respiratory distress on ventilator GI: soft, non-tender, +BS Extremities: warm/dry, no lower extremity edema Skin: no rashes, abrasions to left arm  UOP 2.7L / 24hrs net +3.3L   Labs reviewed:  CBG within goal, Cr slight increase, slight drop in Hgb, mild leukopenia  Resolved Hospital Problem list   Severe HTN Metabolic acidosis Thrombocytopenia  Assessment & Plan:  55 yo F w/ a hx of ETOH use disorder, complicated etoh withdrawals requiring prior intubation, Cocaine abuse, HTN, CKD3, GERD, hx of rectal cancer s/p resection w/ ostomy, Bipolar1, depression, tobacco abuse who is admitted to the hospital for cocaine intoxication and etoh withdrawal.  Acute metabolic encephalopathy likely from alcohol withdrawal with delirium tremens  and cocaine abuse Likely from drug ingestion and alcohol withdrawal with DT, intubated as unable to protect airway.  UDS +cocaine No significant change in mental status, intermittently agitated, not following commands - did not tolerate precedex 2/2 bradycardia, stopped 11/5 - Minimize sedation as able - attempt to wean off fentanyl - ativan prn per CIWA - Scheduled librium, tapering - home  buspirone restarted - started quetiapine - MVI/ folate/ thiamine - Abx as below which could contribute to mental status   Acute respiratory insufficiency related to above encephalopathy  Aspiration pneumonia secondary to S. pneumoniae CXR 11/4 Nodular density seen on the right lung base suspicious for aspiration pneumonia given location and AMS vs confluence of shadows; increasing retrocardiac density Tracheal aspirate positive for Strep pneumo Failed SBT this AM, will attempt to wean sedation further to move towards extubation - Continue MV support, 8cc/kg IBW with goal Pplat <30 and DP<15  - VAP prevention protocol/ PPI - PAD protocol for sedation> RASS goal 0/-1 as above - Wean FiO2 as able for SpO2 >92%  - Daily SAT & SBT- still not tolerating SBT/ mental status remains barrier as well - continue CTX x 5 days - Tylenol prn - Trend fever curve and CBC  ST changes Diastolic dysfunction seen on echo History of HTN -ST changes with possible lateral subendocardial endocardial injury seen on EKG. Troponins mildly elevated 24 > 54 > 66 -Echo 09/06/21 with EF 55 to 60% and grade 1 diastolic dysfunction P: Tele Strict I/Os Hold amlodipine  Cocaine abuse -Positive UDS P: Supportive care Cessation education when appropriate  CKDIII -Creatinine 1.28 06/30/2021, creatinine on admission 1.71 P: Trend BMP /phos/ mag Strict I/Os/ daily wts   Replace electrolytes as indicated Avoid nephrotoxic agents, ensure adequate renal perfusion  History of rectal cancer s/p resection w/ ostomy P: Routine ostomy care  GERD P: PPI  Bipolar 1 disorder -S/p ziprasidone 20 mg in ED for agitation P: Supportive care Consider psych consult when awake and able to interact  Tobacco use disorder P: Cessation education when appropriate  RUE swelling DVT US reveals superficial vein thrombosis Axillary line was discontinued  Best Practice (right click and "Reselect all SmartList Selections"  daily)   Diet/type: tubefeeds and NPO DVT prophylaxis: prophylactic heparin  GI prophylaxis: PPI Lines: N/A Foley:  N/A  Code Status:  full code Last date of multidisciplinary goals of care discussion: pending  Critical care time:    Zola Button, MD [place], PGY-2   See Amion for pager If no response to pager, please call PCCM consult pager After 7:00 pm call Elink

## 2021-09-11 NOTE — Consult Note (Signed)
[place] Nurse Consult Note: Reason for Consult: pressure injury THIS is surgical wound from APR that has not healed due to her nutritional and medical conditions.  Wound type: NON HEALING SURGICAL WOUND since 2018 Pressure Injury POA: NA Measurement:6cm x1.0cm x 1.0 cm Wound AYG:EFUW Drainage (amount, consistency, odor) minimal  Periwound: epibole of wound edges; mild maceration  Dressing procedure/placement/frequency: Saline moist gauze packing, making sure to pack into wound, not on the tissues surrounding wound  Top with dry dressing or foam Change packing daily.  LALM in place while in the ICU for moisture management  Discussed POC with bedside nurse.  Re consult if needed, will not follow at this time. Thanks  Kerah Hardebeck [employer], RN,CWOCN, CNS, [place] [phone])

## 2021-09-11 NOTE — Progress Notes (Addendum)
[place] Progress Note Patient Name: Sherry Baker DOB: September 27, 1966 MRN: [id number]   Date of Service  09/11/2021  HPI/Events of Note  Patient is  no longer getting enteral nutrition and blood sugar is running low.  eICU Interventions  Intravenous fluids switched to glucose containing iv fluids. Restraints renewed to prevent self-extubation.        [name] 09/11/2021, 7:59 PM

## 2021-09-11 NOTE — Progress Notes (Signed)
ABG done results not uploaded yet. Ph -7.273 PCO2- 65.8 PO2- 87 HCO3- 30.5

## 2021-09-12 DIAGNOSIS — G9341 Metabolic encephalopathy: Secondary | ICD-10-CM | POA: Diagnosis not present

## 2021-09-12 LAB — BASIC METABOLIC PANEL
Anion gap: 7 (ref 5–15)
BUN: 18 mg/dL (ref 6–20)
CO2: 24 mmol/L (ref 22–32)
Calcium: 8.7 mg/dL — ABNORMAL LOW (ref 8.9–10.3)
Chloride: 106 mmol/L (ref 98–111)
Creatinine, Ser: 1.28 mg/dL — ABNORMAL HIGH (ref 0.44–1.00)
GFR, Estimated: 50 mL/min — ABNORMAL LOW (ref >60)
Glucose, Bld: 96 mg/dL (ref 70–99)
Potassium: 4.2 mmol/L (ref 3.5–5.1)
Sodium: 137 mmol/L (ref 135–145)

## 2021-09-12 LAB — CBC
HCT: 29.1 % — ABNORMAL LOW (ref 36.0–46.0)
Hemoglobin: 8.9 g/dL — ABNORMAL LOW (ref 12.0–15.0)
MCH: 28.3 pg (ref 26.0–34.0)
MCHC: 30.6 g/dL (ref 30.0–36.0)
MCV: 92.7 fL (ref 80.0–100.0)
Platelets: 197 10*3/uL (ref 150–400)
RBC: 3.14 MIL/uL — ABNORMAL LOW (ref 3.87–5.11)
RDW: 17.3 % — ABNORMAL HIGH (ref 11.5–15.5)
WBC: 3.7 10*3/uL — ABNORMAL LOW (ref 4.0–10.5)
nRBC: 0 % (ref 0.0–0.2)

## 2021-09-12 LAB — GLUCOSE, CAPILLARY
Glucose-Capillary: 78 mg/dL (ref 70–99)
Glucose-Capillary: 66 mg/dL — ABNORMAL LOW (ref 70–99)
Glucose-Capillary: 134 mg/dL — ABNORMAL HIGH (ref 70–99)
Glucose-Capillary: 85 mg/dL (ref 70–99)
Glucose-Capillary: 81 mg/dL (ref 70–99)
Glucose-Capillary: 95 mg/dL (ref 70–99)
Glucose-Capillary: 81 mg/dL (ref 70–99)
Glucose-Capillary: 92 mg/dL (ref 70–99)

## 2021-09-12 MED ORDER — ORAL CARE MOUTH RINSE
15.0000 mL | Freq: Two times a day (BID) | OROMUCOSAL | Status: DC
  Administered 2021-09-12 – 2021-09-30 (×30): 15 mL via OROMUCOSAL

## 2021-09-12 MED ORDER — BETHANECHOL CHLORIDE 5 MG PO TABS
5.0000 mg | ORAL_TABLET | Freq: Three times a day (TID) | ORAL | Status: DC
  Filled 2021-09-12 (×4): qty 1

## 2021-09-12 MED ORDER — DEXTROSE 50 % IV SOLN
12.5000 g | INTRAVENOUS | Status: AC
  Administered 2021-09-12: 12.5 g via INTRAVENOUS

## 2021-09-12 NOTE — Progress Notes (Signed)
NAME:  Sherry Baker, MRN:  [id number], DOB:  May 10, 1966, LOS: 7 ADMISSION DATE:  09/05/2021, CONSULTATION DATE:  09/05/21 REFERRING MD:  Dr. Langston Masker, CHIEF COMPLAINT:  AMS  History of Present Illness:  55 yo F admitted to the hospital for cocaine intoxication and etoh withdrawal. Pt was found minimally responsive behind a dumpster surrounded by beer cans. In the ED the pt was exhibiting hypertension, tachycardia and reportedly intermittent agitation so received 20mg  of geodon and 2mg  of ativan. Pt was intermittently somnolent prior to and after the meds in the ED. PCCM consulted for evaluation and pt was found to not be able to protect her airway, subsequently was intubated and admitted.  Pertinent  Medical History  ETOH use disorder complicated etoh withdrawals requiring prior intubation Cocaine abuse HTN CKD3 GERD Bipolar 1 Depression Tobacco dependence Rectal cancer s/p resection, chemo, and s/p ostomy  Significant Hospital Events: Including procedures, antibiotic start and stop dates in addition to other pertinent events   11/3 Intubated in ED on, admitted 11/5 bradycardia with a heart rate dropped to 46 early a.m. felt secondary to Precedex which was stopped.  This is improving.  Changed to fentanyl gtt, scheduled librium, prn ativan, abx stopped  11/4 unasyn > 11/5 11/4 vanc  11/3 SARS/ flu > neg 11/3 RVP > neg 11/3 MRSA pcr > positive 11/4 Bcx2 >  11/4 trach asp > mod strep pna  11/6 switched to CTX; vanc and Unasyn discontinued 11/7 failed SBT 11/8 completed course of CTX  Interim History / Subjective:  Afebrile Fentanyl gtt weaned off at 0600 Opens eyes to voice but still not following commands Tube feeds discontinued yesterday   Objective   Blood pressure 109/68, pulse 62, temperature 97.8 F (36.6 C), temperature source Axillary, resp. rate 12, height 5\' 2"  (1.575 m), weight 52.9 kg, SpO2 100 %.    Vent Mode: PRVC FiO2 (%):  [30 %] 30 % Set Rate:  [12  bmp-20 bmp] 12 bmp Vt Set:  [420 mL] 420 mL PEEP:  [5 cmH20] 5 cmH20 Plateau Pressure:  [11 cmH20-15 cmH20] 15 cmH20   Intake/Output Summary (Last 24 hours) at 09/12/2021 6010 Last data filed at 09/12/2021 9323 Gross per 24 hour  Intake 666.86 ml  Output 1525 ml  Net -858.14 ml    Filed Weights   09/11/21 0500 09/11/21 2200 09/12/21 0400  Weight: 51.3 kg 52.9 kg 52.9 kg    Examination: General:  acute on chronically ill appearing thin adult female lying in bed in NAD, intermittently wakes up restless HEENT: MM pink/moist, ETT at lip, OGT Neuro:  opens eyes to voice, not following commands currently, in bilateral soft wrist restraints CV: RRR no murmur PULM:  CTAB, no respiratory distress on ventilator GI: soft, non-tender, +BS Extremities: warm/dry, no lower extremity edema Skin: no rashes  UOP 1.3L / 24hrs net +3.4L   Labs reviewed:  mild hypoglycemia, Cr improving, stable anemia, mild leukopenia  Resolved Hospital Problem list   Severe HTN Metabolic acidosis Thrombocytopenia  Assessment & Plan:  55 yo F w/ a hx of ETOH use disorder, complicated etoh withdrawals requiring prior intubation, Cocaine abuse, HTN, CKD3, GERD, hx of rectal cancer s/p resection w/ ostomy, Bipolar1, depression, tobacco abuse who is admitted to the hospital for cocaine intoxication and etoh withdrawal.  Acute metabolic encephalopathy likely from alcohol withdrawal with delirium tremens and cocaine abuse Likely from drug ingestion and alcohol withdrawal with DT, intubated as unable to protect airway.  UDS +cocaine No significant change  in mental status, intermittently agitated, not following commands - did not tolerate precedex 2/2 bradycardia, stopped 11/5 - Minimize sedation as able - weaned off fentanyl, will attempt to extubate today - ativan prn per CIWA - Scheduled librium, tapering - home buspirone restarted - quetiapine - MVI/ folate/ thiamine - Abx as below which could contribute  to mental status   Acute respiratory insufficiency related to above encephalopathy  Aspiration pneumonia secondary to S. pneumoniae CXR 11/4 Nodular density seen on the right lung base suspicious for aspiration pneumonia given location and AMS vs confluence of shadows; increasing retrocardiac density Tracheal aspirate positive for Strep pneumo - Continue MV support, 8cc/kg IBW with goal Pplat <30 and DP<15  - VAP prevention protocol/ PPI - PAD protocol for sedation> RASS goal 0/-1 as above - Wean FiO2 as able for SpO2 >92%  - Daily SAT & SBT- still not tolerating SBT/ mental status remains barrier as well - s/p CTX - Tylenol prn - Trend fever curve and CBC  ST changes Diastolic dysfunction seen on echo History of HTN -ST changes with possible lateral subendocardial endocardial injury seen on EKG. Troponins mildly elevated 24 > 54 > 66 -Echo 09/06/21 with EF 55 to 60% and grade 1 diastolic dysfunction P: Tele Strict I/Os Hold amlodipine  Cocaine abuse -Positive UDS P: Supportive care Cessation education when appropriate  CKDIII -Creatinine 1.28 06/30/2021, creatinine on admission 1.71 Back to baseline P: Trend BMP /phos/ mag Strict I/Os/ daily wts   Replace electrolytes as indicated Avoid nephrotoxic agents, ensure adequate renal perfusion  History of rectal cancer s/p resection w/ ostomy P: Routine ostomy care  GERD P: PPI  Bipolar 1 disorder -S/p ziprasidone 20 mg in ED for agitation P: Supportive care Consider psych consult when awake and able to interact  Tobacco use disorder P: Cessation education when appropriate  RUE swelling DVT US reveals superficial vein thrombosis Axillary line was discontinued  Best Practice (right click and "Reselect all SmartList Selections" daily)   Diet/type: NPO DVT prophylaxis: prophylactic heparin  GI prophylaxis: PPI Lines: N/A Foley:  Yes, and it is still needed  Code Status:  full code Last date of  multidisciplinary goals of care discussion: pending  Critical care time:    Zola Button, MD [place], PGY-2   See Amion for pager If no response to pager, please call PCCM consult pager After 7:00 pm call Elink

## 2021-09-12 NOTE — Procedures (Addendum)
Extubation Procedure Note  Patient Details:   Name: Sherry Baker DOB: December 28, 1965 MRN: [id number]   Airway Documentation:    Vent end date: 09/12/21 vent end [id number]   Evaluation  O2 sats: stable throughout Complications: No apparent complications Patient did tolerate procedure well. Bilateral Breath Sounds: Clear, Diminished   Yes   Pt extubated per MD order to 4L [place]. No stridor heard, positive cuff leak noted. RT will continue to monitor.   Tobi Bastos 09/12/2021, 3:34 PM

## 2021-09-12 NOTE — Progress Notes (Signed)
Patient tolerated pressure support  Mental status improved  Decision was made to extubate  Following extubation, able to tell us her name Reports a cough  Does appear stable  Continue to monitor closely

## 2021-09-13 ENCOUNTER — Inpatient Hospital Stay (HOSPITAL_COMMUNITY): Payer: Medicaid Other

## 2021-09-13 DIAGNOSIS — F10921 Alcohol use, unspecified with intoxication delirium: Secondary | ICD-10-CM | POA: Diagnosis not present

## 2021-09-13 DIAGNOSIS — J9602 Acute respiratory failure with hypercapnia: Secondary | ICD-10-CM

## 2021-09-13 DIAGNOSIS — F10931 Alcohol use, unspecified with withdrawal delirium: Secondary | ICD-10-CM | POA: Diagnosis not present

## 2021-09-13 DIAGNOSIS — J9601 Acute respiratory failure with hypoxia: Secondary | ICD-10-CM | POA: Diagnosis not present

## 2021-09-13 LAB — PHOSPHORUS: Phosphorus: 4.3 mg/dL (ref 2.5–4.6)

## 2021-09-13 LAB — GLUCOSE, CAPILLARY
Glucose-Capillary: 96 mg/dL (ref 70–99)
Glucose-Capillary: 112 mg/dL — ABNORMAL HIGH (ref 70–99)
Glucose-Capillary: 85 mg/dL (ref 70–99)
Glucose-Capillary: 94 mg/dL (ref 70–99)

## 2021-09-13 LAB — MAGNESIUM: Magnesium: 1.9 mg/dL (ref 1.7–2.4)

## 2021-09-13 MED ORDER — AMLODIPINE BESYLATE 10 MG PO TABS
10.0000 mg | ORAL_TABLET | Freq: Every day | ORAL | Status: DC
  Administered 2021-09-14 – 2021-09-20 (×7): 10 mg
  Filled 2021-09-13 (×7): qty 1

## 2021-09-13 MED ORDER — LABETALOL HCL 5 MG/ML IV SOLN
5.0000 mg | INTRAVENOUS | Status: DC | PRN
  Administered 2021-09-13 – 2021-09-14 (×2): 5 mg via INTRAVENOUS
  Filled 2021-09-13 (×3): qty 4

## 2021-09-13 MED ORDER — OSMOLITE 1.5 CAL PO LIQD
1000.0000 mL | ORAL | Status: DC
  Administered 2021-09-13 – 2021-09-20 (×7): 1000 mL
  Filled 2021-09-13 (×9): qty 1000

## 2021-09-13 MED ORDER — BETHANECHOL CHLORIDE 5 MG PO TABS
5.0000 mg | ORAL_TABLET | Freq: Three times a day (TID) | ORAL | Status: DC
Start: 2021-09-13 — End: 2021-09-20
  Administered 2021-09-13 – 2021-09-20 (×21): 5 mg
  Filled 2021-09-13 (×23): qty 1

## 2021-09-13 MED ORDER — AMLODIPINE BESYLATE 10 MG PO TABS: 10.0000 mg | ORAL_TABLET | Freq: Every day | ORAL | Status: DC

## 2021-09-13 MED ORDER — PROSOURCE TF PO LIQD
45.0000 mL | Freq: Two times a day (BID) | ORAL | Status: DC
  Administered 2021-09-13 – 2021-09-21 (×16): 45 mL
  Filled 2021-09-13 (×17): qty 45

## 2021-09-13 NOTE — Progress Notes (Signed)
Nutrition Follow-up  DOCUMENTATION CODES:   Severe malnutrition in context of chronic illness  INTERVENTION:  Initiate tube feeding via Cortrak: Start Osmolite 1.5 at 20 ml/h  and advance by 10 mL q 6 hours to goal rate of  45 mL/hr (1080 ml per day) Prosource TF 45 ml BID   Provides 1700 kcal, 90 gm protein, 823 ml free water daily  - Continue MVI with minerals daily per tube   Monitor magnesium, potassium, and phosphorus BID for at least 3 days, MD to replete as needed, as pt is at risk for refeeding syndrome given severe malnutrition, EtOH abuse.  NUTRITION DIAGNOSIS:   Severe Malnutrition related to chronic illness (cirrhosis, polysubstance abuse) as evidenced by severe muscle depletion, severe fat depletion.  On-going  GOAL:   Patient will meet greater than or equal to 90% of their needs  Not being met; will meet needs through Cortrak placement  MONITOR:   Vent status, Labs, Weight trends, TF tolerance, Skin, I & O's  REASON FOR ASSESSMENT:   Consult Enteral/tube feeding initiation and management  ASSESSMENT:   55 year old female who presented to the ED on 11/03 with AMS. PMH of EtOH abuse, cirrhosis, complicated EtOH withdrawals requiring prior intubation, cocaine abuse, HTN, CKD stage III, GERD, rectal cancer s/p resection with colostomy, bipolar 1 disorder, depression, tobacco abuse. Pt admitted with cocaine intoxication and EtOH withdrawal and required intubation for airway protection.  11/9: TF d/c  11/10: extubated; OG removed  Discussed pt in interdisciplinary rounds. Per SLP pt to remain NPO. D/t NPO status, inadequate nutrition and malnutrition, spoke with MD who agrees with Cortrak placement. Since pt has been without nutrition x2 days, will start slow and monitor tolerance. Pt at risk for refeeding syndrome, will check refeeding labs.  Admit weight: 44.8 kg Weight 09/06/21: 46.3 kg Current weight: 52.9 kg  Medications: colace, folic acid, MVI with  minerals, protonix, miralax, thiamine   Labs reviewed: creatinine 1.28 CBG's: 81-112 x24 hours   UOP: 3200 ml x 12 hours I/O's: +1596m since admit   Diet Order:   Diet Order             Diet NPO time specified  Diet effective now                   EDUCATION NEEDS:   Not appropriate for education at this time  Skin:  Skin Assessment: Skin Integrity Issues: Skin Integrity Issues:: Stage III, Other (Comment) Stage III: coccyx Other: skin tear L arm  Last BM:  09/13/21- type 6/7  via colostomy  Height:   Ht Readings from Last 1 Encounters:  09/05/21 _0  (1.575 m)    Weight:   Wt Readings from Last 1 Encounters:  09/12/21 52.9 kg    BMI:  Body mass index is 21.33 kg/m.  Estimated Nutritional Needs:   Kcal:  1600-1800  Protein:  80-95g  Fluid:  >1.6L  AClayborne Dana RDN, LDN Clinical Nutrition

## 2021-09-13 NOTE — Progress Notes (Signed)
   09/13/21 1253  Assess: MEWS Score  Temp 98.9 F (37.2 C)  BP (!) 156/92  Pulse Rate 92  Resp (!) 28  Level of Consciousness Responds to Voice  SpO2 99 %  O2 Device Room Air  Assess: MEWS Score  MEWS Temp 0  MEWS Systolic 0  MEWS Pulse 0  MEWS RR 2  MEWS LOC 1  MEWS Score 3  MEWS Score Color Yellow  Assess: if the MEWS score is Yellow or Red  Were vital signs taken at a resting state? Yes  Focused Assessment No change from prior assessment  Early Detection of Sepsis Score *See Row Information* Medium  MEWS guidelines implemented *See Row Information* No, vital signs rechecked  Treat  MEWS Interventions Escalated (See documentation below)  Pain Scale Faces  Faces Pain Scale 0  Take Vital Signs  Increase Vital Sign Frequency  Yellow: Q 2hr X 2 then Q 4hr X 2, if remains yellow, continue Q 4hrs  Escalate  MEWS: Escalate Yellow: discuss with charge nurse/RN and consider discussing with provider and RRT  Notify: Charge Nurse/RN  Name of Charge Nurse/RN Notified Arlice Colt RN  Date Charge Nurse/RN Notified 09/13/21  Time Charge Nurse/RN Notified 1251  Notify: Provider  Provider Name/Title Dr. Tacy Learn  Date Provider Notified 09/13/21  Time Provider Notified 1331  Notification Type Call  Provider response No new orders  Date of Provider Response 09/13/21  Time of Provider Response 1335

## 2021-09-13 NOTE — Progress Notes (Signed)
NAME:  Sherry Baker, MRN:  [id number], DOB:  1966-01-26, LOS: 69 ADMISSION DATE:  09/05/2021, CONSULTATION DATE:  09/05/21 REFERRING MD:  Dr. Langston Masker, CHIEF COMPLAINT:  AMS  History of Present Illness:  55 yo F admitted to the hospital for cocaine intoxication and etoh withdrawal. Pt was found minimally responsive behind a dumpster surrounded by beer cans. In the ED the pt was exhibiting hypertension, tachycardia and reportedly intermittent agitation so received 20mg  of geodon and 2mg  of ativan. Pt was intermittently somnolent prior to and after the meds in the ED. PCCM consulted for evaluation and pt was found to not be able to protect her airway, subsequently was intubated and admitted.  Pertinent  Medical History  ETOH use disorder complicated etoh withdrawals requiring prior intubation Cocaine abuse HTN CKD3 GERD Bipolar 1 Depression Tobacco dependence Rectal cancer s/p resection, chemo, and s/p ostomy  Significant Hospital Events: Including procedures, antibiotic start and stop dates in addition to other pertinent events   11/3 Intubated in ED on, admitted 11/5 bradycardia with a heart rate dropped to 46 early a.m. felt secondary to Precedex which was stopped.  This is improving.  Changed to fentanyl gtt, scheduled librium, prn ativan, abx stopped  11/4 unasyn > 11/5 11/4 vanc  11/3 SARS/ flu > neg 11/3 RVP > neg 11/3 MRSA pcr > positive 11/4 Bcx2 >  11/4 trach asp > mod strep pna  11/6 switched to CTX; vanc and Unasyn discontinued 11/7 failed SBT 11/8 completed course of CTX 11/10 extubated  Interim History / Subjective:  Afebrile Extubated yesterday    Objective   Blood pressure (!) 164/86, pulse 83, temperature 98.9 F (37.2 C), temperature source Oral, resp. rate (!) 28, height 5\' 2"  (1.575 m), weight 52.9 kg, SpO2 96 %.    Vent Mode: PSV;CPAP FiO2 (%):  [30 %] 30 % Set Rate:  [12 bmp] 12 bmp Vt Set:  [420 mL] 420 mL PEEP:  [5 cmH20] 5 cmH20 Pressure  Support:  [10 cmH20] 10 cmH20   Intake/Output Summary (Last 24 hours) at 09/13/2021 0850 Last data filed at 09/13/2021 0800 Gross per 24 hour  Intake 1708.57 ml  Output 3730 ml  Net -2021.43 ml   Filed Weights   09/11/21 0500 09/11/21 2200 09/12/21 0400  Weight: 51.3 kg 52.9 kg 52.9 kg    Examination: General:  acute on chronically ill appearing thin adult female lying in bed in NAD, soft restraint on right wrist HEENT: MM pink/moist, OGT Neuro:  somnolent, inattention noted, speech difficult to understand, somewhat irritable CV: RRR no murmur PULM:  CTAB, no respiratory distress GI: soft, non-tender, +BS Extremities: warm/dry, no lower extremity edema Skin: no rashes  UOP 2.2L / 24hrs net +1.5L   Resolved Hospital Problem list   Severe HTN Metabolic acidosis Thrombocytopenia Aspiration pneumonia secondary to S. Pneumoniae Acute respiratory insufficiency  Assessment & Plan:  55 yo F w/ a hx of ETOH use disorder, complicated etoh withdrawals requiring prior intubation, Cocaine abuse, HTN, CKD3, GERD, hx of rectal cancer s/p resection w/ ostomy, Bipolar1, depression, tobacco abuse who is admitted to the hospital for cocaine intoxication and etoh withdrawal.  Acute metabolic encephalopathy likely from alcohol withdrawal with delirium tremens and cocaine abuse Likely from drug ingestion and alcohol withdrawal with DT, intubated as unable to protect airway.  UDS +cocaine Extubated yesterday, mental status improving Stable for floor transfer today - Minimize sedation as able - d/c ativan prn per CIWA - Scheduled librium, tapering - home buspirone -  quetiapine - MVI/ folate/ thiamine - TOC consult  ST changes Diastolic dysfunction seen on echo History of HTN -ST changes with possible lateral subendocardial endocardial injury seen on EKG. Troponins mildly elevated 24 > 54 > 66 -Echo 09/06/21 with EF 55 to 60% and grade 1 diastolic dysfunction BP rising P: Tele Strict  I/Os Restart amlodipine Prn labetalol until passes SLP eval  Cocaine abuse Tobacco abuse -Positive UDS P: - TOC consult  CKDIII -Creatinine 1.28 06/30/2021, creatinine on admission 1.71 Back to baseline P: Trend BMP /phos/ mag Strict I/Os/ daily wts   Replace electrolytes as indicated Avoid nephrotoxic agents, ensure adequate renal perfusion  History of rectal cancer s/p resection w/ ostomy P: Routine ostomy care  GERD P: PPI  Bipolar 1 disorder -S/p ziprasidone 20 mg in ED for agitation P: Supportive care Consider psych consult when awake and able to interact  RUE swelling DVT US reveals superficial vein thrombosis Axillary line was discontinued  Best Practice (right click and "Reselect all SmartList Selections" daily)   Diet/type: NPO DVT prophylaxis: prophylactic heparin  GI prophylaxis: PPI Lines: N/A Foley:  Yes, and it is still needed  Code Status:  full code Last date of multidisciplinary goals of care discussion: pending  Critical care time:    Zola Button, MD [place], PGY-2   See Amion for pager If no response to pager, please call PCCM consult pager After 7:00 pm call Elink

## 2021-09-13 NOTE — Procedures (Signed)
Cortrak  Person Inserting Tube:  Alroy Dust, Takia Runyon L, RD Tube Type:  Cortrak - 43 inches Tube Size:  10 Tube Location:  Left nare Initial Placement:  Stomach Secured by: Bridle Technique Used to Measure Tube Placement:  Marking at nare/corner of mouth Cortrak Secured At:  65 cm  Cortrak Tube Team Note:  Consult received to place a Cortrak feeding tube.   X-ray is required, abdominal x-ray has been ordered by the Cortrak team. Please confirm tube placement before using the Cortrak tube.   If the tube becomes dislodged please keep the tube and contact the Cortrak team at [email or web address] ([email or web address]) for replacement.  If after hours and replacement cannot be delayed, place a NG tube and confirm placement with an abdominal x-ray.    Hermina Barters BS, PLDN Clinical Dietitian See [facility] for contact information.

## 2021-09-13 NOTE — Evaluation (Signed)
Clinical/Bedside Swallow Evaluation Patient Details  Name: Sherry Baker MRN: [id number] Date of Birth: 08-14-1966  Today's Date: 09/13/2021 Time: SLP Start Time (ACUTE ONLY): 1151 SLP Stop Time (ACUTE ONLY): 1203 SLP Time Calculation (min) (ACUTE ONLY): 12 min  Past Medical History:  Past Medical History:  Diagnosis Date   Alcohol abuse    Allergy    PCNS swelling   Arthritis    Bipolar 1 disorder ([place])    Cancer ([place]) 01/21/2017   rectal cancer   Cancer ([place])    Cirrhosis of liver ([place])    Depression    Genetic testing 03/24/2017   Ms. Rupnow underwent genetic counseling and testing for hereditary cancer syndromes on 02/17/2017. Her results were negative for mutations in all 46 genes analyzed by [employer]'s 46-gene Common Hereditary Cancers Panel. Genes analyzed include: APC, ATM, AXIN2, BARD1, BMPR1A, BRCA1, BRCA2, BRIP1, CDH1, CDKN2A, CHEK2, CTNNA1, DICER1, EPCAM, GREM1, HOXB13, KIT, MEN1, MLH1, MSH2, MSH3, MSH6, MUTYH, NBN,   Hypertension    Past Surgical History:  Past Surgical History:  Procedure Laterality Date   ABDOMINAL PERINEAL BOWEL RESECTION N/A 06/18/2017   Procedure: ABDOMINAL PERINEAL RESECTION ERAS PATHWAY;  Surgeon: Leighton Ruff, MD;  Location: [facility];  Service: General;  Laterality: N/A;   COLON SURGERY     COLONOSCOPY WITH PROPOFOL Left 01/21/2017   Procedure: COLONOSCOPY WITH PROPOFOL;  Surgeon: Otis Brace, MD;  Location: [place];  Service: Gastroenterology;  Laterality: Left;   FRACTURE SURGERY     MANDIBLE FRACTURE SURGERY     HPI:  55 yo F admitted to the hospital for cocaine intoxication and etoh withdrawal. In the ED the pt was exhibiting hypertension, tachycardia and reportedly intermittent agitation so received 74m of geodon and 243mof ativan. Pt was intermittently somnolent prior to and after the meds in the ED. PCCM consulted for evaluation and pt was found to not be able to protect her airway, subsequently was intubated and admitted. CXR  11/7 concerning for BLL opacities. Pt with ETT 11/3-11/10. Pt is known to this service from prior admissions.  She was unable to participate in assessment in May of this year.  In August 2020 pt was seen at bedside with recommendations for mechanical soft solids and thin liquid.  Pt with prior medical history of ETOH use disorder, complicated etoh withdrawals requiring prior intubation; Cocaine abuse; HTN; CKD3; GERD; Bipolar 1; Depression; Tobacco dependence; Rectal cancer s/p resection, chemo, and s/p ostomy.    Assessment / Plan / Recommendation  Clinical Impression  Pt presents with clinical indicators of pharyngeal dysphagia now s/p 7 day intubation.  Pt was orally defensive and would not allow for visualization of oral cavity.  What dentition could be seen appears to be in poor condition.  Pt with wet/congesting/rattly breath sounds at baseline. Pt requried 2 swallows with cup sip of liquid.  With straw sip there was immediate, prolonged coughing.  Pt tolerated puree relatively well, but oral phase and clearance could not be assessed.  With regular solid, pt was unable to fully masticate cracker, there was prolonged oral phase, decreased A-P transit, and significant oral residue.  SLP used suction to attempt to clear oral cavity.  Pt was resistant to oral care and would not allow visualization of oral cavity.  Per RD, cortrak planned to address pt's nutritional needs as she will likely need nutritional support 2/2 mentation.  Suspect pt's presentation is related to prolonged intubation and AMS and will hopefully improve as pt's overall condition improves.  Pt  is not appropriate for instrumental assessment of swallow at this time.    Recommend pt remain NPO with short term alternate means of nutrition, hydration, and medication.  SLP to follow for PO readiness and/or possible instrumental swallow study.  SLP Visit Diagnosis: Dysphagia, unspecified (R13.10)    Aspiration Risk  Moderate aspiration risk     Diet Recommendation NPO   Medication Administration: Via alternative means    Other  Recommendations Oral Care Recommendations: Oral care QID    Recommendations for follow up therapy are one component of a multi-disciplinary discharge planning process, led by the attending physician.  Recommendations may be updated based on patient status, additional functional criteria and insurance authorization.  Follow up Recommendations  (TBD, Continue ST at next level of care.)      Assistance Recommended at Discharge  (TBD)  Functional Status Assessment Patient has not had a recent decline in their functional status  Frequency and Duration min 2x/week  2 weeks       Prognosis Prognosis for Safe Diet Advancement: Good      Swallow Study   General Date of Onset: 09/05/21 HPI: 55 yo F admitted to the hospital for cocaine intoxication and etoh withdrawal. In the ED the pt was exhibiting hypertension, tachycardia and reportedly intermittent agitation so received 35m of geodon and 273mof ativan. Pt was intermittently somnolent prior to and after the meds in the ED. PCCM consulted for evaluation and pt was found to not be able to protect her airway, subsequently was intubated and admitted. CXR 11/7 concerning for BLL opacities. Pt with ETT 11/3-11/10. Pt is known to this service from prior admissions.  She was unable to participate in assessment in May of this year.  In August 2020 pt was seen at bedside with recommendations for mechanical soft solids and thin liquid.  Pt with prior medical history of ETOH use disorder, complicated etoh withdrawals requiring prior intubation; Cocaine abuse; HTN; CKD3; GERD; Bipolar 1; Depression; Tobacco dependence; Rectal cancer s/p resection, chemo, and s/p ostomy. Type of Study: Bedside Swallow Evaluation Previous Swallow Assessment: August 2020 Diet Prior to this Study: NPO Temperature Spikes Noted: No Respiratory Status: Nasal cannula History of Recent  Intubation: Yes Length of Intubations (days): 7 days Date extubated: 09/12/21 Behavior/Cognition: Confused;Lethargic/Drowsy;Uncooperative Oral Cavity Assessment:  (Unable to assess) Oral Cavity - Dentition: Poor condition;Missing dentition Vision:  (unable to assess) Self-Feeding Abilities: Total assist Patient Positioning: Upright in bed Baseline Vocal Quality: Wet;Hoarse Volitional Cough: Cognitively unable to elicit Volitional Swallow: Unable to elicit    Oral/Motor/Sensory Function Overall Oral Motor/Sensory Function:  (Unable to assess) Facial ROM:  (Unable to assess) Facial Symmetry: Within Functional Limits Facial Strength:  (Unable to assess) Lingual ROM:  (Unable to assess) Lingual Symmetry:  (Unable to assess) Lingual Strength:  (Unable to assess) Velum:  (Unable to assess) Mandible:  (Unable to assess)   Ice Chips Ice chips: Not tested   Thin Liquid Thin Liquid: Impaired Presentation: Cup;Straw Oral Phase Functional Implications: Oral holding Pharyngeal  Phase Impairments: Wet Vocal Quality;Cough - Immediate;Multiple swallows    Nectar Thick Nectar Thick Liquid: Not tested   Honey Thick Honey Thick Liquid: Not tested   Puree Puree: Within functional limits Presentation: Spoon Oral Phase Functional Implications: Prolonged oral transit   Solid     Solid: Impaired Oral Phase Impairments: Reduced labial seal;Impaired mastication Oral Phase Functional Implications: Prolonged oral transit;Impaired mastication;Oral holding;Oral residue Pharyngeal Phase Impairments: Cough - Delayed      Tatiyana Foucher E Queena Monrreal, MA,  [place] Office: [phone] 09/13/2021,12:27 PM

## 2021-09-14 DIAGNOSIS — I10 Essential (primary) hypertension: Secondary | ICD-10-CM | POA: Diagnosis not present

## 2021-09-14 DIAGNOSIS — Z8659 Personal history of other mental and behavioral disorders: Secondary | ICD-10-CM

## 2021-09-14 DIAGNOSIS — R5381 Other malaise: Secondary | ICD-10-CM

## 2021-09-14 DIAGNOSIS — I5032 Chronic diastolic (congestive) heart failure: Secondary | ICD-10-CM | POA: Diagnosis not present

## 2021-09-14 DIAGNOSIS — F419 Anxiety disorder, unspecified: Secondary | ICD-10-CM

## 2021-09-14 DIAGNOSIS — R1312 Dysphagia, oropharyngeal phase: Secondary | ICD-10-CM

## 2021-09-14 DIAGNOSIS — R778 Other specified abnormalities of plasma proteins: Secondary | ICD-10-CM

## 2021-09-14 DIAGNOSIS — F17200 Nicotine dependence, unspecified, uncomplicated: Secondary | ICD-10-CM

## 2021-09-14 DIAGNOSIS — F32A Depression, unspecified: Secondary | ICD-10-CM

## 2021-09-14 DIAGNOSIS — R9431 Abnormal electrocardiogram [ECG] [EKG]: Secondary | ICD-10-CM

## 2021-09-14 DIAGNOSIS — F1410 Cocaine abuse, uncomplicated: Secondary | ICD-10-CM

## 2021-09-14 LAB — MAGNESIUM
Magnesium: 2.1 mg/dL (ref 1.7–2.4)
Magnesium: 2.2 mg/dL (ref 1.7–2.4)

## 2021-09-14 LAB — CBC WITH DIFFERENTIAL/PLATELET
Abs Immature Granulocytes: 0.15 10*3/uL — ABNORMAL HIGH (ref 0.00–0.07)
Basophils Absolute: 0 10*3/uL (ref 0.0–0.1)
Basophils Relative: 1 %
Eosinophils Absolute: 0.1 10*3/uL (ref 0.0–0.5)
Eosinophils Relative: 1 %
HCT: 35.5 % — ABNORMAL LOW (ref 36.0–46.0)
Hemoglobin: 11.1 g/dL — ABNORMAL LOW (ref 12.0–15.0)
Immature Granulocytes: 3 %
Lymphocytes Relative: 18 %
Lymphs Abs: 0.9 10*3/uL (ref 0.7–4.0)
MCH: 29.1 pg (ref 26.0–34.0)
MCHC: 31.3 g/dL (ref 30.0–36.0)
MCV: 92.9 fL (ref 80.0–100.0)
Monocytes Absolute: 0.8 10*3/uL (ref 0.1–1.0)
Monocytes Relative: 15 %
Neutro Abs: 3.3 10*3/uL (ref 1.7–7.7)
Neutrophils Relative %: 62 %
Platelets: 280 10*3/uL (ref 150–400)
RBC: 3.82 MIL/uL — ABNORMAL LOW (ref 3.87–5.11)
RDW: 17.2 % — ABNORMAL HIGH (ref 11.5–15.5)
WBC: 5.2 10*3/uL (ref 4.0–10.5)
nRBC: 0.6 % — ABNORMAL HIGH (ref 0.0–0.2)

## 2021-09-14 LAB — GLUCOSE, CAPILLARY
Glucose-Capillary: 102 mg/dL — ABNORMAL HIGH (ref 70–99)
Glucose-Capillary: 106 mg/dL — ABNORMAL HIGH (ref 70–99)
Glucose-Capillary: 106 mg/dL — ABNORMAL HIGH (ref 70–99)
Glucose-Capillary: 115 mg/dL — ABNORMAL HIGH (ref 70–99)
Glucose-Capillary: 132 mg/dL — ABNORMAL HIGH (ref 70–99)
Glucose-Capillary: 136 mg/dL — ABNORMAL HIGH (ref 70–99)
Glucose-Capillary: 89 mg/dL (ref 70–99)

## 2021-09-14 LAB — TSH: TSH: 1.89 u[IU]/mL (ref 0.350–4.500)

## 2021-09-14 LAB — VITAMIN D 25 HYDROXY (VIT D DEFICIENCY, FRACTURES): Vit D, 25-Hydroxy: 21.16 ng/mL — ABNORMAL LOW (ref 30–100)

## 2021-09-14 LAB — COMPREHENSIVE METABOLIC PANEL
ALT: 20 U/L (ref 0–44)
AST: 35 U/L (ref 15–41)
Albumin: 2.3 g/dL — ABNORMAL LOW (ref 3.5–5.0)
Alkaline Phosphatase: 55 U/L (ref 38–126)
Anion gap: 12 (ref 5–15)
BUN: 14 mg/dL (ref 6–20)
CO2: 21 mmol/L — ABNORMAL LOW (ref 22–32)
Calcium: 9 mg/dL (ref 8.9–10.3)
Chloride: 103 mmol/L (ref 98–111)
Creatinine, Ser: 1.35 mg/dL — ABNORMAL HIGH (ref 0.44–1.00)
GFR, Estimated: 47 mL/min — ABNORMAL LOW (ref >60)
Glucose, Bld: 113 mg/dL — ABNORMAL HIGH (ref 70–99)
Potassium: 4.5 mmol/L (ref 3.5–5.1)
Sodium: 136 mmol/L (ref 135–145)
Total Bilirubin: 0.5 mg/dL (ref 0.3–1.2)
Total Protein: 6.7 g/dL (ref 6.5–8.1)

## 2021-09-14 LAB — AMMONIA: Ammonia: <10 umol/L (ref 9–35)

## 2021-09-14 LAB — VITAMIN B12: Vitamin B-12: 445 pg/mL (ref 180–914)

## 2021-09-14 LAB — PHOSPHORUS
Phosphorus: 4.6 mg/dL (ref 2.5–4.6)
Phosphorus: 4.7 mg/dL — ABNORMAL HIGH (ref 2.5–4.6)

## 2021-09-14 MED ORDER — HYDRALAZINE HCL 25 MG PO TABS: 25.0000 mg | ORAL_TABLET | Freq: Three times a day (TID) | ORAL | Status: DC

## 2021-09-14 MED ORDER — THIAMINE HCL 100 MG PO TABS: 100.0000 mg | ORAL_TABLET | Freq: Every day | ORAL | Status: DC

## 2021-09-14 MED ORDER — CARVEDILOL 6.25 MG PO TABS
6.2500 mg | ORAL_TABLET | Freq: Two times a day (BID) | ORAL | Status: DC
  Administered 2021-09-14 – 2021-09-15 (×4): 6.25 mg
  Filled 2021-09-14 (×4): qty 1

## 2021-09-14 MED ORDER — CITALOPRAM HYDROBROMIDE 20 MG PO TABS
10.0000 mg | ORAL_TABLET | Freq: Every day | ORAL | Status: DC
  Administered 2021-09-14 – 2021-09-20 (×7): 10 mg
  Filled 2021-09-14 (×7): qty 1

## 2021-09-14 MED ORDER — THIAMINE HCL 100 MG/ML IJ SOLN
500.0000 mg | Freq: Three times a day (TID) | INTRAVENOUS | Status: AC
  Administered 2021-09-14 – 2021-09-15 (×3): 500 mg via INTRAVENOUS
  Filled 2021-09-14 (×3): qty 5

## 2021-09-14 MED ORDER — THIAMINE HCL 100 MG/ML IJ SOLN
250.0000 mg | INTRAVENOUS | Status: DC
  Filled 2021-09-14: qty 2.5

## 2021-09-14 MED ORDER — CHLORHEXIDINE GLUCONATE CLOTH 2 % EX PADS
6.0000 | MEDICATED_PAD | Freq: Every day | CUTANEOUS | Status: DC
  Administered 2021-09-14 – 2021-09-22 (×7): 6 via TOPICAL

## 2021-09-14 NOTE — Progress Notes (Signed)
PROGRESS NOTE  Sherry Baker YYT:[id number] DOB: August 22, 1966   PCP: Default, Provider, MD  Patient is from: Home  DOA: 09/05/2021 LOS: 9  Chief complaints:  No chief complaint on file.    Brief Narrative / Interim history: 55 year old F with PMH of EtOH abuse, complicated EtOH withdrawal requiring intubation, cocaine use, tobacco use disorder, CKD-3, HTN, depression, bipolar disorder, vitamin D deficiency, vitamin B12 deficiency, rectal cancer s/p resection, chemo and ostomy brought to ED after found to be minimally responsive behind a dumpster surrounded by beer cans.  She was hypotensive with tachycardia, agitation and intermittent somnolence.  She was intubated for airway protection and admitted to ICU for complicated EtOH withdrawal.  Initially treated with Precedex which was discontinued due to bradycardia.  She was a started on fentanyl drip, Librium and Ativan as needed.  She also received empiric antibiotics from 11/4-11/8 with Vanco and Unasyn then on Vanco and CTX presumably for respiratory infection. Eventually extubated on 11/10, and transferred to [facility] service on 11/12.  She is on TF via cortrak for dysphagia.  Still encephalopathic.   Subjective: Seen and examined earlier this morning.  No major events overnight of this morning.  Patient is somewhat encephalopathic but wakes to voice.  She does not follows command or respond to questions.  Somewhat gurgly as she breathes this.  Does not appear to be in distress.  No focal neurodeficit but limited exam due to mental status.  Objective: Vitals:   09/14/21 0020 09/14/21 0102 09/14/21 0407 09/14/21 0735  BP: (!) 180/119 (!) 173/80 (!) 175/85 (!) 178/91  Pulse: 89 74 73 73  Resp:   19 18  Temp:   98.7 F (37.1 C) (!) 100.4 F (38 C)  TempSrc:   Oral Oral  SpO2:   98% 97%  Weight:   48.8 kg   Height:        Intake/Output Summary (Last 24 hours) at 09/14/2021 1148 Last data filed at 09/14/2021 0408 Gross per 24 hour  Intake  98.2 ml  Output 3435 ml  Net -3336.8 ml   Filed Weights   09/11/21 2200 09/12/21 0400 09/14/21 0407  Weight: 52.9 kg 52.9 kg 48.8 kg    Examination:  GENERAL: No apparent distress.  Nontoxic. HEENT: MMM.  Vision and hearing grossly intact. On TF via Cortrak in place NECK: Supple.  No apparent JVD.  RESP: 97% on RA.  No IWOB.  Wet and rhonchi CVS:  RRR. Heart sounds normal.  ABD/GI/GU: BS+. Abd soft, NTND.  Posey belt across her abdomen MSK/EXT:  Moves extremities. No edema.  Significant muscle mass and subcu fat loss. SKIN: no apparent skin lesion or wound NEURO: Somewhat somnolent and sleepy.  Wakes to voice but does not respond to questions, follows command.  PERRL.  No facial asymmetry.  Patellar reflex symmetric.  Further neuro exam limited by mental status. PSYCH: Somewhat somnolent.  No distress or agitation.  Procedures:  11/3-11/10-intubation and mechanical ventilation.  Microbiology summarized: 11/3-COVID-19, influenza PCR and full RVP negative 11/3-MRSA PCR positive 11/4-respiratory culture with moderate Streptococcus pneumonia sensitive to CTX 11/4-blood cultures negative.  Assessment & Plan: Complicated alcohol withdrawal requiring intubation and mechanical ventilation Cocaine use disorder -Oral Librium taper and Ativan as needed -Continue multivitamins and folic acid. -Start high-dose thiamine -Continue CIWA monitoring -TOC consulted for counseling or resources when she wakes up  Acute metabolic encephalopathy-likely due to the above.  She also has history of vitamin B12 and vitamin D deficiency.  No focal neuro deficit  on exam but limited due to mental status.  CT head on admission without acute finding.  She is somewhat somnolent but wakes to voice.  Does not respond to questions nor follows commands. -Intubated for airway protection 11/3-11/11.  On RA.  Able to cough and seems to be able to protect airway now -Basic encephalopathy labs-TSH, B12 level, B1  level, D level, RPR and ammonia -Start high-dose thiamine -Intubated for airway protection from 11/3-11/11 -Aspiration precaution. -Continue abdominal belt  Dysphagia: Likely from intubation and encephalopathy.  SLP recommended n.p.o. -On TF via Cortrak -Encephalopathy work-up as above -May consider further brain imaging if no improvement  Abnormal EKG with ST changes Mildly elevated troponin, 24> 54>> 66-likely demand ischemia. Chronic diastolic CHF-TTE on 13/2 with LVEF of 55 to 60%, G1 DD, no RWMA Uncontrolled hypertension -Add Coreg 6.25 mg twice daily -Continue amlodipine -Add p.o. hydralazine as needed   Tobacco use disorder -Encourage cessation when able to comprehend -Nicotine patch   AKI on CKD-3A/azotemia: AKI resolved. Recent Labs    06/30/21 0312 09/05/21 1506 09/06/21 0212 09/07/21 0203 09/08/21 0313 09/09/21 4401 09/10/21 0430 09/11/21 0228 09/12/21 0223 09/14/21 0818  BUN 72* 23* 21* 18 15 15 17 19 18 14   CREATININE 1.28* 1.71* 1.40* 1.32* 1.27* 1.48* 1.39* 1.55* 1.28* 1.35*  -Continue monitoring  History of vitamin B12 deficiency History of vitamin D deficiency -Check B12 and D levels and replenish as appropriate.  History of rectal cancer s/p resection w/ostomy -Routine ostomy care   GERD -Continue Protonix   Anxiety/depression/bipolar 1 disorder: Not able to assess this given her mental status. -Continue BuSpar, Celexa and Seroquel   RUE swelling/superficial thrombophlebitis -Axillary line was discontinued  Physical deconditioning due to acute illness and chronic alcohol abuse -PT/OT  Severe malnutrition: as evidenced by low BMI and significant muscle mass and subcu fat loss. Body mass index is 19.68 kg/m. Nutrition Problem: Severe Malnutrition Etiology: chronic illness (cirrhosis, polysubstance abuse) Signs/Symptoms: severe muscle depletion, severe fat depletion Interventions: Tube feeding, Prostat, MVI  Pressure skin injury:  POA Pressure Injury 06/23/21 Coccyx Lower;Medial Unstageable - Full thickness tissue loss in which the base of the injury is covered by slough (yellow, tan, gray, green or brown) and/or eschar (tan, brown or black) in the wound bed. (Active)  06/23/21 0100  Location: Coccyx  Location Orientation: Lower;Medial  Staging: Unstageable - Full thickness tissue loss in which the base of the injury is covered by slough (yellow, tan, gray, green or brown) and/or eschar (tan, brown or black) in the wound bed.  Wound Description (Comments):   Present on Admission: Yes     Pressure Injury 09/05/21 Coccyx Mid Stage 3 -  Full thickness tissue loss. Subcutaneous fat may be visible but bone, tendon or muscle are NOT exposed. dehissed surgical wound from 2018; NOT pressure (Active)  09/05/21 2300  Location: Coccyx  Location Orientation: Mid  Staging: Stage 3 -  Full thickness tissue loss. Subcutaneous fat may be visible but bone, tendon or muscle are NOT exposed.  Wound Description (Comments): dehissed surgical wound from 2018; NOT pressure  Present on Admission: Yes   DVT prophylaxis:  heparin injection 5,000 Units Start: 09/06/21 0600 SCDs Start: 09/05/21 2126  Code Status: Full code Family Communication: Updated Crystal, who identifies herself as a cousin.  She is listed as patient contact. Level of care: Med-Surg Status is: Inpatient  Remains inpatient appropriate because: Encephalopathy, uncontrolled hypertension, dysphagia       Consultants:  Pulmonology   Sch Meds:  Scheduled Meds:  amLODipine  10 mg Per Tube Daily   bethanechol  5 mg Per Tube TID   busPIRone  15 mg Per Tube BID   carvedilol  6.25 mg Per Tube BID WC   Chlorhexidine Gluconate Cloth  6 each Topical Daily   docusate  100 mg Per Tube BID   feeding supplement (PROSource TF)  45 mL Per Tube BID   folic acid  1 mg Per Tube Daily   heparin  5,000 Units Subcutaneous Q8H   mouth rinse  15 mL Mouth Rinse BID    multivitamin with minerals  1 tablet Per Tube Daily   nicotine  14 mg Transdermal Daily   pantoprazole sodium  40 mg Per Tube QHS   polyethylene glycol  17 g Per Tube Daily   QUEtiapine  25 mg Per Tube BID   thiamine  100 mg Per Tube Daily   Continuous Infusions:  feeding supplement (OSMOLITE 1.5 CAL) 40 mL/hr at 09/14/21 0554   PRN Meds:.acetaminophen (TYLENOL) oral liquid 160 mg/5 mL, labetalol  Antimicrobials: Anti-infectives (From admission, onward)    Start     Dose/Rate Route Frequency Ordered Stop   09/08/21 0830  cefTRIAXone (ROCEPHIN) 2 g in sodium chloride 0.9 % 100 mL IVPB       Note to Pharmacy: Tolerated unasyn   2 g 200 mL/hr over 30 Minutes Intravenous Every 24 hours 09/08/21 0731 09/10/21 0832   09/07/21 1000  vancomycin (VANCOREADY) IVPB 500 mg/100 mL  Status:  Discontinued        500 mg 100 mL/hr over 60 Minutes Intravenous Every 24 hours 09/06/21 1452 09/07/21 1037   09/06/21 1633  Ampicillin-Sulbactam (UNASYN) 3 g in sodium chloride 0.9 % 100 mL IVPB  Status:  Discontinued        3 g 200 mL/hr over 30 Minutes Intravenous Every 8 hours 09/06/21 1258 09/07/21 1037   09/06/21 1545  vancomycin (VANCOREADY) IVPB 1000 mg/200 mL        1,000 mg 200 mL/hr over 60 Minutes Intravenous  Once 09/06/21 1452 09/06/21 1629   09/06/21 0915  Ampicillin-Sulbactam (UNASYN) 3 g in sodium chloride 0.9 % 100 mL IVPB  Status:  Discontinued        3 g 200 mL/hr over 30 Minutes Intravenous Every 12 hours 09/06/21 0817 09/06/21 1258        I have personally reviewed the following labs and images: CBC: Recent Labs  Lab 09/09/21 0638 09/10/21 0430 09/11/21 0228 09/11/21 0249 09/11/21 0910 09/12/21 0223 09/14/21 0818  WBC 5.8 3.3* 3.9*  --   --  3.7* 5.2  NEUTROABS  --   --  2.6  --   --   --  3.3  HGB 8.8* 9.0* 8.3* 8.8* 8.2* 8.9* 11.1*  HCT 28.9* 29.4* 27.3* 26.0* 24.0* 29.1* 35.5*  MCV 94.1 93.0 94.1  --   --  92.7 92.9  PLT 149* 168 177  --   --  197 280   BMP  &GFR Recent Labs  Lab 09/08/21 1652 09/09/21 0638 09/10/21 0430 09/11/21 0228 09/11/21 0249 09/11/21 0910 09/12/21 0223 09/13/21 1710 09/14/21 0459 09/14/21 0818  NA  --  140 138 137 140 140 137  --   --  136  K  --  4.2 3.7 4.2 4.3 4.0 4.2  --   --  4.5  CL  --  112* 105 101  --   --  106  --   --  103  CO2  --  24 24 28   --   --  24  --   --  21*  GLUCOSE  --  115* 102* 126*  --   --  96  --   --  113*  BUN  --  15 17 19   --   --  18  --   --  14  CREATININE  --  1.48* 1.39* 1.55*  --   --  1.28*  --   --  1.35*  CALCIUM  --  8.3* 8.5* 8.7*  --   --  8.7*  --   --  9.0  MG 1.7 1.9  --  2.5*  --   --   --  1.9 2.1  --   PHOS 3.9 4.6  --  6.0*  --   --   --  4.3 4.6  --    Estimated Creatinine Clearance: 37.1 mL/min (A) (by C-G formula based on SCr of 1.35 mg/dL (H)). Liver & Pancreas: Recent Labs  Lab 09/14/21 0818  AST 35  ALT 20  ALKPHOS 55  BILITOT 0.5  PROT 6.7  ALBUMIN 2.3*   No results for input(s): LIPASE, AMYLASE in the last 168 hours. No results for input(s): AMMONIA in the last 168 hours. Diabetic: No results for input(s): HGBA1C in the last 72 hours. Recent Labs  Lab 09/13/21 1148 09/13/21 2046 09/14/21 0003 09/14/21 0406 09/14/21 0802  GLUCAP 85 94 89 106* 136*   Cardiac Enzymes: No results for input(s): CKTOTAL, CKMB, CKMBINDEX, TROPONINI in the last 168 hours. No results for input(s): PROBNP in the last 8760 hours. Coagulation Profile: No results for input(s): INR, PROTIME in the last 168 hours. Thyroid Function Tests: No results for input(s): TSH, T4TOTAL, FREET4, T3FREE, THYROIDAB in the last 72 hours. Lipid Profile: No results for input(s): CHOL, HDL, LDLCALC, TRIG, CHOLHDL, LDLDIRECT in the last 72 hours. Anemia Panel: No results for input(s): VITAMINB12, FOLATE, FERRITIN, TIBC, IRON, RETICCTPCT in the last 72 hours. Urine analysis:    Component Value Date/Time   COLORURINE STRAW (A) 09/05/2021 1638   APPEARANCEUR CLEAR 09/05/2021  1638   LABSPEC 1.005 09/05/2021 1638   PHURINE 6.0 09/05/2021 1638   GLUCOSEU NEGATIVE 09/05/2021 1638   HGBUR SMALL (A) 09/05/2021 1638   BILIRUBINUR NEGATIVE 09/05/2021 1638   KETONESUR NEGATIVE 09/05/2021 1638   PROTEINUR 100 (A) 09/05/2021 1638   UROBILINOGEN 0.2 12/23/2017 1417   NITRITE NEGATIVE 09/05/2021 1638   LEUKOCYTESUR NEGATIVE 09/05/2021 1638   Sepsis Labs: Invalid input(s): PROCALCITONIN, Abie  Microbiology: Recent Results (from the past 240 hour(s))  Respiratory (~20 pathogens) panel by PCR     Status: None   Collection Time: 09/05/21 10:00 PM   Specimen: Nasopharyngeal Swab; Respiratory  Result Value Ref Range Status   Adenovirus NOT DETECTED NOT DETECTED Final   Coronavirus 229E NOT DETECTED NOT DETECTED Final    Comment: (NOTE) The Coronavirus on the Respiratory Panel, DOES NOT test for the novel  Coronavirus (2019 nCoV)    Coronavirus HKU1 NOT DETECTED NOT DETECTED Final   Coronavirus NL63 NOT DETECTED NOT DETECTED Final   Coronavirus OC43 NOT DETECTED NOT DETECTED Final   Metapneumovirus NOT DETECTED NOT DETECTED Final   Rhinovirus / Enterovirus NOT DETECTED NOT DETECTED Final   Influenza A NOT DETECTED NOT DETECTED Final   Influenza B NOT DETECTED NOT DETECTED Final   Parainfluenza Virus 1 NOT DETECTED NOT DETECTED Final   Parainfluenza Virus 2 NOT DETECTED NOT DETECTED Final   Parainfluenza Virus 3  NOT DETECTED NOT DETECTED Final   Parainfluenza Virus 4 NOT DETECTED NOT DETECTED Final   Respiratory Syncytial Virus NOT DETECTED NOT DETECTED Final   Bordetella pertussis NOT DETECTED NOT DETECTED Final   Bordetella Parapertussis NOT DETECTED NOT DETECTED Final   Chlamydophila pneumoniae NOT DETECTED NOT DETECTED Final   Mycoplasma pneumoniae NOT DETECTED NOT DETECTED Final    Comment: Performed at [facility], [place] [address], [place], [place] [address]  Resp Panel by RT-PCR (Flu A&B, Covid) Nasopharyngeal Swab     Status: None    Collection Time: 09/05/21 10:00 PM   Specimen: Nasopharyngeal Swab; Nasopharyngeal(NP) swabs in vial transport medium  Result Value Ref Range Status   SARS Coronavirus 2 by RT PCR NEGATIVE NEGATIVE Final    Comment: (NOTE) SARS-CoV-2 target nucleic acids are NOT DETECTED.  The SARS-CoV-2 RNA is generally detectable in upper respiratory specimens during the acute phase of infection. The lowest concentration of SARS-CoV-2 viral copies this assay can detect is 138 copies/mL. A negative result does not preclude SARS-Cov-2 infection and should not be used as the sole basis for treatment or other patient management decisions. A negative result may occur with  improper specimen collection/handling, submission of specimen other than nasopharyngeal swab, presence of viral mutation(s) within the areas targeted by this assay, and inadequate number of viral copies(<138 copies/mL). A negative result must be combined with clinical observations, patient history, and epidemiological information. The expected result is Negative.  Fact Sheet for Patients:  EntrepreneurPulse.com.au  Fact Sheet for Healthcare Providers:  IncredibleEmployment.be  This test is no t yet approved or cleared by the [place] FDA and  has been authorized for detection and/or diagnosis of SARS-CoV-2 by FDA under an Emergency Use Authorization (EUA). This EUA will remain  in effect (meaning this test can be used) for the duration of the COVID-19 declaration under Section 564(b)(1) of the Act, 21 U.S.C.section 360bbb-3(b)(1), unless the authorization is terminated  or revoked sooner.       Influenza A by PCR NEGATIVE NEGATIVE Final   Influenza B by PCR NEGATIVE NEGATIVE Final    Comment: (NOTE) The Xpert Xpress SARS-CoV-2/FLU/RSV plus assay is intended as an aid in the diagnosis of influenza from Nasopharyngeal swab specimens and should not be used as a sole basis for treatment.  Nasal washings and aspirates are unacceptable for Xpert Xpress SARS-CoV-2/FLU/RSV testing.  Fact Sheet for Patients: EntrepreneurPulse.com.au  Fact Sheet for Healthcare Providers: IncredibleEmployment.be  This test is not yet approved or cleared by the [place] FDA and has been authorized for detection and/or diagnosis of SARS-CoV-2 by FDA under an Emergency Use Authorization (EUA). This EUA will remain in effect (meaning this test can be used) for the duration of the COVID-19 declaration under Section 564(b)(1) of the Act, 21 U.S.C. section 360bbb-3(b)(1), unless the authorization is terminated or revoked.  Performed at [facility], [place] [address], [place], [place] [address]   MRSA Next Gen by PCR, Nasal     Status: Abnormal   Collection Time: 09/05/21 11:18 PM   Specimen: Nasal Mucosa; Nasal Swab  Result Value Ref Range Status   MRSA by PCR Next Gen DETECTED (A) NOT DETECTED Final    Comment: RESULT CALLED TO, READ BACK BY AND VERIFIED WITH: RN DEBBIE HOPPER 09/06/21@1 :07 BY TW (NOTE) The GeneXpert MRSA Assay (FDA approved for NASAL specimens only), is one component of a comprehensive MRSA colonization surveillance program. It is not intended to diagnose MRSA infection nor to guide  or monitor treatment for MRSA infections. Test performance is not FDA approved in patients less than 25 years old. Performed at [facility], [place] [address] [place], [place] [address]   Culture, Respiratory w Gram Stain     Status: None   Collection Time: 09/06/21  8:58 AM   Specimen: Tracheal Aspirate; Respiratory  Result Value Ref Range Status   Specimen Description TRACHEAL ASPIRATE  Final   Special Requests NONE  Final   Gram Stain   Final    MODERATE WBC PRESENT, PREDOMINANTLY PMN MODERATE GRAM POSITIVE COCCI Performed at [facility], [place] [address], [place], [place] [address]    Culture MODERATE STREPTOCOCCUS PNEUMONIAE   Final   Report Status 09/08/2021 FINAL  Final   Organism ID, Bacteria STREPTOCOCCUS PNEUMONIAE  Final      Susceptibility   Streptococcus pneumoniae - MIC*    ERYTHROMYCIN >=8 RESISTANT Resistant     LEVOFLOXACIN 0.5 SENSITIVE Sensitive     VANCOMYCIN 0.5 SENSITIVE Sensitive     PENICILLIN (meningitis) 1 RESISTANT Resistant     PENO - penicillin 1      PENICILLIN (non-meningitis) 1 SENSITIVE Sensitive     PENICILLIN (oral) 1 INTERMEDIATE Intermediate     CEFTRIAXONE (non-meningitis) 1 SENSITIVE Sensitive     CEFTRIAXONE (meningitis) 1 INTERMEDIATE Intermediate     * MODERATE STREPTOCOCCUS PNEUMONIAE  Blood culture (routine x 2)     Status: None   Collection Time: 09/06/21 10:06 AM   Specimen: BLOOD RIGHT HAND  Result Value Ref Range Status   Specimen Description BLOOD RIGHT HAND  Final   Special Requests   Final    BOTTLES DRAWN AEROBIC AND ANAEROBIC Blood Culture adequate volume   Culture   Final    NO GROWTH 5 DAYS Performed at [facility], [address], [place], [place] [address]    Report Status 09/11/2021 FINAL  Final  Blood culture (routine x 2)     Status: None   Collection Time: 09/06/21 10:21 AM   Specimen: BLOOD  Result Value Ref Range Status   Specimen Description BLOOD RIGHT THUMB  Final   Special Requests   Final    BOTTLES DRAWN AEROBIC AND ANAEROBIC Blood Culture results may not be optimal due to an inadequate volume of blood received in culture bottles   Culture   Final    NO GROWTH 5 DAYS Performed at [facility], [place] [address], [place], [place] [address]    Report Status 09/11/2021 FINAL  Final    Radiology Studies: DG Abd Portable 1V  Result Date: 09/13/2021 CLINICAL DATA:  Feeding tube placement EXAM: PORTABLE ABDOMEN - 1 VIEW COMPARISON:  X-ray abdomen 09/06/2021 FINDINGS: Left trace pleural effusion. Enteric tube with tip overlying the expected region of the gastric antrum/first portion of the duodenum. The bowel gas pattern is  normal. No radio-opaque calculi or other significant radiographic abnormality are seen. IMPRESSION: 1. Enteric tube with tip overlying the expected region of the gastric antrum/first portion of the duodenum. 2. Left trace pleural effusion. Electronically Signed   By: Iven Finn M.D.   On: 09/13/2021 15:31    60 minutes with more than 50% spent in reviewing records, counseling patient/family and coordinating care.   [place] [address] [place]  If 7PM-7AM, please contact night-coverage [email or web address] 09/14/2021, 11:48 AM

## 2021-09-14 NOTE — Plan of Care (Signed)
Patient ID: Sherry Baker, female   DOB: 10-Jul-1966, 55 y.o.   MRN: [id number]  Problem: Safety: Goal: Non-violent Restraint(s) Outcome: Progressing   Problem: Clinical Measurements: Goal: Ability to maintain clinical measurements within normal limits will improve Outcome: Progressing Goal: Will remain free from infection Outcome: Progressing Goal: Diagnostic test results will improve Outcome: Progressing Goal: Respiratory complications will improve Outcome: Progressing Goal: Cardiovascular complication will be avoided Outcome: Progressing   Problem: Activity: Goal: Risk for activity intolerance will decrease Outcome: Progressing   Problem: Nutrition: Goal: Adequate nutrition will be maintained Outcome: Progressing   Problem: Coping: Goal: Level of anxiety will decrease Outcome: Progressing   Problem: Elimination: Goal: Will not experience complications related to bowel motility Outcome: Progressing Goal: Will not experience complications related to urinary retention Outcome: Progressing   Problem: Pain Managment: Goal: General experience of comfort will improve Outcome: Progressing   Problem: Safety: Goal: Ability to remain free from injury will improve Outcome: Progressing   Problem: Skin Integrity: Goal: Risk for impaired skin integrity will decrease Outcome: Progressing   Problem: Education: Goal: Knowledge of General Education information will improve Description: Including pain rating scale, medication(s)/side effects and non-pharmacologic comfort measures Outcome: Not Progressing  Patient unable to participate in education at this time.   Problem: Health Behavior/Discharge Planning: Goal: Ability to manage health-related needs will improve Outcome: Not Progressing  Patient unable to participate in care at this time.  Haydee Salter, RN

## 2021-09-15 DIAGNOSIS — F419 Anxiety disorder, unspecified: Secondary | ICD-10-CM | POA: Diagnosis not present

## 2021-09-15 DIAGNOSIS — I10 Essential (primary) hypertension: Secondary | ICD-10-CM | POA: Diagnosis not present

## 2021-09-15 DIAGNOSIS — I5032 Chronic diastolic (congestive) heart failure: Secondary | ICD-10-CM | POA: Diagnosis not present

## 2021-09-15 DIAGNOSIS — R5381 Other malaise: Secondary | ICD-10-CM | POA: Diagnosis not present

## 2021-09-15 LAB — RENAL FUNCTION PANEL
Albumin: 2.4 g/dL — ABNORMAL LOW (ref 3.5–5.0)
Anion gap: 8 (ref 5–15)
BUN: 24 mg/dL — ABNORMAL HIGH (ref 6–20)
CO2: 22 mmol/L (ref 22–32)
Calcium: 9.1 mg/dL (ref 8.9–10.3)
Chloride: 107 mmol/L (ref 98–111)
Creatinine, Ser: 1.35 mg/dL — ABNORMAL HIGH (ref 0.44–1.00)
GFR, Estimated: 47 mL/min — ABNORMAL LOW (ref >60)
Glucose, Bld: 115 mg/dL — ABNORMAL HIGH (ref 70–99)
Phosphorus: 4.2 mg/dL (ref 2.5–4.6)
Potassium: 4.4 mmol/L (ref 3.5–5.1)
Sodium: 137 mmol/L (ref 135–145)

## 2021-09-15 LAB — GLUCOSE, CAPILLARY
Glucose-Capillary: 106 mg/dL — ABNORMAL HIGH (ref 70–99)
Glucose-Capillary: 113 mg/dL — ABNORMAL HIGH (ref 70–99)
Glucose-Capillary: 135 mg/dL — ABNORMAL HIGH (ref 70–99)
Glucose-Capillary: 87 mg/dL (ref 70–99)
Glucose-Capillary: 96 mg/dL (ref 70–99)
Glucose-Capillary: 97 mg/dL (ref 70–99)

## 2021-09-15 LAB — IRON AND TIBC
Iron: 57 ug/dL (ref 28–170)
Saturation Ratios: 15 % (ref 10.4–31.8)
TIBC: 374 ug/dL (ref 250–450)
UIBC: 317 ug/dL

## 2021-09-15 LAB — CBC
HCT: 35.4 % — ABNORMAL LOW (ref 36.0–46.0)
Hemoglobin: 11.4 g/dL — ABNORMAL LOW (ref 12.0–15.0)
MCH: 28.8 pg (ref 26.0–34.0)
MCHC: 32.2 g/dL (ref 30.0–36.0)
MCV: 89.4 fL (ref 80.0–100.0)
Platelets: 358 10*3/uL (ref 150–400)
RBC: 3.96 MIL/uL (ref 3.87–5.11)
RDW: 17.1 % — ABNORMAL HIGH (ref 11.5–15.5)
WBC: 6.2 10*3/uL (ref 4.0–10.5)
nRBC: 0 % (ref 0.0–0.2)

## 2021-09-15 LAB — VITAMIN B12: Vitamin B-12: 473 pg/mL (ref 180–914)

## 2021-09-15 LAB — RPR: RPR Ser Ql: NONREACTIVE

## 2021-09-15 LAB — MAGNESIUM
Magnesium: 2.4 mg/dL (ref 1.7–2.4)
Magnesium: 2.4 mg/dL (ref 1.7–2.4)

## 2021-09-15 LAB — PHOSPHORUS
Phosphorus: 4.2 mg/dL (ref 2.5–4.6)
Phosphorus: 4.4 mg/dL (ref 2.5–4.6)

## 2021-09-15 LAB — RETICULOCYTES
Immature Retic Fract: 23.3 % — ABNORMAL HIGH (ref 2.3–15.9)
RBC.: 4.01 MIL/uL (ref 3.87–5.11)
Retic Count, Absolute: 110.3 10*3/uL (ref 19.0–186.0)
Retic Ct Pct: 2.8 % (ref 0.4–3.1)

## 2021-09-15 LAB — FOLATE: Folate: 16.1 ng/mL (ref >5.9)

## 2021-09-15 LAB — FERRITIN: Ferritin: 80 ng/mL (ref 11–307)

## 2021-09-15 LAB — PROCALCITONIN: Procalcitonin: <0.1 ng/mL

## 2021-09-15 LAB — CK: Total CK: 23 U/L — ABNORMAL LOW (ref 38–234)

## 2021-09-15 MED ORDER — THIAMINE HCL 100 MG PO TABS: 100.0000 mg | ORAL_TABLET | Freq: Every day | ORAL | Status: DC

## 2021-09-15 MED ORDER — THIAMINE HCL 100 MG/ML IJ SOLN
250.0000 mg | INTRAVENOUS | Status: DC
  Administered 2021-09-16 – 2021-09-19 (×4): 250 mg via INTRAVENOUS
  Filled 2021-09-15 (×5): qty 2.5

## 2021-09-15 MED ORDER — CHOLECALCIFEROL 10 MCG/ML (400 UNIT/ML) PO LIQD
800.0000 [IU] | Freq: Every day | ORAL | Status: DC
  Administered 2021-09-15 – 2021-09-19 (×5): 800 [IU] via ORAL
  Filled 2021-09-15 (×6): qty 2

## 2021-09-15 NOTE — Progress Notes (Addendum)
   09/15/21 0842  Assess: MEWS Score  Temp 97.6 F (36.4 C)  BP 139/78  Pulse Rate 88  Resp (!) 30  Level of Consciousness Alert  SpO2 100 %  O2 Device Room Air  Patient Activity (if Appropriate) In bed  Assess: MEWS Score  MEWS Temp 0  MEWS Systolic 0  MEWS Pulse 0  MEWS RR 2  MEWS LOC 0  MEWS Score 2  MEWS Score Color Yellow  Assess: if the MEWS score is Yellow or Red  Were vital signs taken at a resting state? Yes  Focused Assessment Change from prior assessment (see assessment flowsheet)  Early Detection of Sepsis Score *See Row Information* Low  MEWS guidelines implemented *See Row Information* Yes  Treat  MEWS Interventions Other (Comment) (Will reassess, patient just readjusted in bed.)  Pain Scale Faces  Pain Score 0  Take Vital Signs  Increase Vital Sign Frequency  Yellow: Q 2hr X 2 then Q 4hr X 2, if remains yellow, continue Q 4hrs  Escalate  MEWS: Escalate Yellow: discuss with charge nurse/RN and consider discussing with provider and RRT  Notify: Charge Nurse/RN  Name of Charge Nurse/RN Notified Leanord Hawking, RN  Date Charge Nurse/RN Notified 09/15/21  Time Charge Nurse/RN Notified [phone]  Document  Progress note created (see row info) Yes  Patient readjusted in bed and VS were taken by NT shortly after readjusting. Will reassess patient's VS.

## 2021-09-15 NOTE — Progress Notes (Signed)
PROGRESS NOTE  Sherry Baker CVE:[id number] DOB: 1966/09/30   PCP: Default, Provider, MD  Patient is from: Home  DOA: 09/05/2021 LOS: 10  Chief complaints:  No chief complaint on file.    Brief Narrative / Interim history: 55 year old F with PMH of EtOH abuse, complicated EtOH withdrawal requiring intubation, cocaine use, tobacco use disorder, CKD-3, HTN, depression, bipolar disorder, vitamin D deficiency, vitamin B12 deficiency, rectal cancer s/p resection, chemo and ostomy brought to ED after found to be minimally responsive behind a dumpster surrounded by beer cans.  She was hypotensive with tachycardia, agitation and intermittent somnolence.  She was intubated for airway protection and admitted to ICU for complicated EtOH withdrawal.  Initially treated with Precedex which was discontinued due to bradycardia.  She was a started on fentanyl drip, Librium and Ativan as needed.  She also received empiric antibiotics from 11/4-11/8 with Vanco and Unasyn then on Vanco and CTX presumably for respiratory infection. Eventually extubated on 11/10, and transferred to [facility] service on 11/12.  She is on TF via cortrak for dysphagia.  Encephalopathy seems to have improved some.  Subjective: Seen and examined earlier this morning.  No major events overnight of this morning.  She says she wants to go home.  She is oriented to self and "[facility]".  Follows command but slow.  Objective: Vitals:   09/15/21 0842 09/15/21 1042 09/15/21 1228 09/15/21 1242  BP: 139/78 (!) 143/81 (!) 144/80 131/72  Pulse: 88 77 78 79  Resp: (!) 30 (!) 30 (!) 28 (!) 28  Temp: 97.6 F (36.4 C)  98.8 F (37.1 C) 98.6 F (37 C)  TempSrc: Oral  Oral   SpO2: 100% 98% 100% 98%  Weight:      Height:        Intake/Output Summary (Last 24 hours) at 09/15/2021 1312 Last data filed at 09/15/2021 0900 Gross per 24 hour  Intake 0 ml  Output 2700 ml  Net -2700 ml   Filed Weights   09/12/21 0400 09/14/21 0407  09/15/21 0418  Weight: 52.9 kg 48.8 kg 49.9 kg    Examination:  GENERAL: No apparent distress.  Nontoxic. HEENT: MMM.  Vision and hearing grossly intact.  On TF via cortrack NECK: Supple.  No apparent JVD.  RESP:  No IWOB.  Fair aeration bilaterally. CVS:  RRR. Heart sounds normal.  ABD/GI/GU: BS+. Abd soft, NTND.  Indwelling Foley.  Safety belt across abdomen. MSK/EXT:  Moves extremities.  Significant muscle mass and subcu fat loss. SKIN: no apparent skin lesion or wound NEURO: Awake but not quite alert.  Oriented to self and "[facility]".  Follows commands.  No apparent focal neuro deficit. PSYCH: Calm.  No distress or agitation but trying to get out of the bed.  Procedures:  11/3-11/10-intubation and mechanical ventilation.  Microbiology summarized: 11/3-COVID-19, influenza PCR and full RVP negative 11/3-MRSA PCR positive 11/4-respiratory culture with moderate Streptococcus pneumonia sensitive to CTX 11/4-blood cultures negative.  Assessment & Plan: Complicated alcohol withdrawal requiring intubation and mechanical ventilation Cocaine use disorder -Completed Librium taper. -Continue multivitamins and folic acid. -Started high-dose thiamine on 11/12. -Discontinued CIWA monitoring. -TOC consulted for counseling or resources when she wakes up  Acute metabolic encephalopathy-likely due to the above.  CTH, ammonia, TSH, B12 and RPR unrevealing. No focal neuro deficit to suggest CVA but limited exam.  Encephalopathy seems to be improving.  Might be waxing or waning per patient's cousin. -Started high-dose thiamine on 11/12-continue -Aspiration precaution. -Continue abdominal belt for  safety.  Dysphagia: Likely from intubation and encephalopathy.  SLP recommended n.p.o. -On TF via Cortrak -Continue SLP eval  Abnormal EKG with ST changes Mildly elevated troponin, 24> 54>> 66-likely demand ischemia. Chronic diastolic CHF-TTE on 01/0 with LVEF of 55 to 60%, G1 DD, no  RWMA Uncontrolled hypertension: Normotensive. -Added Coreg 6.25 mg twice daily on 11/12. -Continue amlodipine 10 mg daily -Add p.o. hydralazine as needed   Tobacco use disorder -Encourage cessation when able to comprehend -Nicotine patch   AKI on CKD-3A/azotemia: AKI resolved. Recent Labs    09/05/21 1506 09/06/21 0212 09/07/21 0203 09/08/21 0313 09/09/21 9323 09/10/21 0430 09/11/21 0228 09/12/21 0223 09/14/21 0818 09/15/21 0600  BUN 23* 21* 18 15 15 17 19 18 14  24*  CREATININE 1.71* 1.40* 1.32* 1.27* 1.48* 1.39* 1.55* 1.28* 1.35* 1.35*  -Continue monitoring  History of vitamin B12 deficiency-B12-473 now History of vitamin D deficiency-vitamin D slightly low at 21.16. -Check B12 and D levels and replenish as appropriate.  History of rectal cancer s/p resection w/ostomy -Routine ostomy care   GERD -Continue Protonix   Anxiety/depression/bipolar 1 disorder: Not able to assess this given her mental status. -Continue BuSpar, Celexa and Seroquel   RUE swelling/superficial thrombophlebitis -Axillary line was discontinued  Physical deconditioning due to acute illness and chronic alcohol abuse -PT/OT-recommends SNF.  Severe malnutrition: as evidenced by low BMI and significant muscle mass and subcu fat loss. Body mass index is 20.12 kg/m. Nutrition Problem: Severe Malnutrition Etiology: chronic illness (cirrhosis, polysubstance abuse) Signs/Symptoms: severe muscle depletion, severe fat depletion Interventions: Tube feeding, Prostat, MVI  Pressure skin injury: POA Pressure Injury 06/23/21 Coccyx Lower;Medial Unstageable - Full thickness tissue loss in which the base of the injury is covered by slough (yellow, tan, gray, green or brown) and/or eschar (tan, brown or black) in the wound bed. (Active)  06/23/21 0100  Location: Coccyx  Location Orientation: Lower;Medial  Staging: Unstageable - Full thickness tissue loss in which the base of the injury is covered by slough  (yellow, tan, gray, green or brown) and/or eschar (tan, brown or black) in the wound bed.  Wound Description (Comments):   Present on Admission: Yes     Pressure Injury 09/05/21 Coccyx Mid Stage 3 -  Full thickness tissue loss. Subcutaneous fat may be visible but bone, tendon or muscle are NOT exposed. dehissed surgical wound from 2018; NOT pressure (Active)  09/05/21 2300  Location: Coccyx  Location Orientation: Mid  Staging: Stage 3 -  Full thickness tissue loss. Subcutaneous fat may be visible but bone, tendon or muscle are NOT exposed.  Wound Description (Comments): dehissed surgical wound from 2018; NOT pressure  Present on Admission: Yes   DVT prophylaxis:  heparin injection 5,000 Units Start: 09/06/21 0600 SCDs Start: 09/05/21 2126  Code Status: Full code Family Communication: Updated Crystal, who identifies herself as a cousin.  She is listed as patient contact. Level of care: Med-Surg Status is: Inpatient  Remains inpatient appropriate because: Encephalopathy soft abdominal restraints and dysphagia requiring tube feed.   Final disposition: Likely SNF.    Consultants:  Pulmonology   Sch Meds:  Scheduled Meds:  amLODipine  10 mg Per Tube Daily   bethanechol  5 mg Per Tube TID   busPIRone  15 mg Per Tube BID   carvedilol  6.25 mg Per Tube BID WC   Chlorhexidine Gluconate Cloth  6 each Topical Daily   citalopram  10 mg Per Tube Daily   docusate  100 mg Per Tube BID  feeding supplement (PROSource TF)  45 mL Per Tube BID   folic acid  1 mg Per Tube Daily   heparin  5,000 Units Subcutaneous Q8H   mouth rinse  15 mL Mouth Rinse BID   multivitamin with minerals  1 tablet Per Tube Daily   nicotine  14 mg Transdermal Daily   pantoprazole sodium  40 mg Per Tube QHS   polyethylene glycol  17 g Per Tube Daily   QUEtiapine  25 mg Per Tube BID   [START ON 09/20/2021] thiamine  100 mg Oral Daily   Continuous Infusions:  feeding supplement (OSMOLITE 1.5 CAL) 1,000 mL  (09/15/21 0027)   thiamine injection     PRN Meds:.acetaminophen (TYLENOL) oral liquid 160 mg/5 mL, labetalol  Antimicrobials: Anti-infectives (From admission, onward)    Start     Dose/Rate Route Frequency Ordered Stop   09/08/21 0830  cefTRIAXone (ROCEPHIN) 2 g in sodium chloride 0.9 % 100 mL IVPB       Note to Pharmacy: Tolerated unasyn   2 g 200 mL/hr over 30 Minutes Intravenous Every 24 hours 09/08/21 0731 09/10/21 0832   09/07/21 1000  vancomycin (VANCOREADY) IVPB 500 mg/100 mL  Status:  Discontinued        500 mg 100 mL/hr over 60 Minutes Intravenous Every 24 hours 09/06/21 1452 09/07/21 1037   09/06/21 1633  Ampicillin-Sulbactam (UNASYN) 3 g in sodium chloride 0.9 % 100 mL IVPB  Status:  Discontinued        3 g 200 mL/hr over 30 Minutes Intravenous Every 8 hours 09/06/21 1258 09/07/21 1037   09/06/21 1545  vancomycin (VANCOREADY) IVPB 1000 mg/200 mL        1,000 mg 200 mL/hr over 60 Minutes Intravenous  Once 09/06/21 1452 09/06/21 1629   09/06/21 0915  Ampicillin-Sulbactam (UNASYN) 3 g in sodium chloride 0.9 % 100 mL IVPB  Status:  Discontinued        3 g 200 mL/hr over 30 Minutes Intravenous Every 12 hours 09/06/21 0817 09/06/21 1258        I have personally reviewed the following labs and images: CBC: Recent Labs  Lab 09/10/21 0430 09/11/21 0228 09/11/21 0249 09/11/21 0910 09/12/21 0223 09/14/21 0818 09/15/21 0600  WBC 3.3* 3.9*  --   --  3.7* 5.2 6.2  NEUTROABS  --  2.6  --   --   --  3.3  --   HGB 9.0* 8.3* 8.8* 8.2* 8.9* 11.1* 11.4*  HCT 29.4* 27.3* 26.0* 24.0* 29.1* 35.5* 35.4*  MCV 93.0 94.1  --   --  92.7 92.9 89.4  PLT 168 177  --   --  197 280 358   BMP &GFR Recent Labs  Lab 09/10/21 0430 09/11/21 0228 09/11/21 0249 09/11/21 0910 09/12/21 0223 09/13/21 1710 09/14/21 0459 09/14/21 0818 09/14/21 1609 09/15/21 0600  NA 138 137 140 140 137  --   --  136  --  137  K 3.7 4.2 4.3 4.0 4.2  --   --  4.5  --  4.4  CL 105 101  --   --  106  --    --  103  --  107  CO2 24 28  --   --  24  --   --  21*  --  22  GLUCOSE 102* 126*  --   --  96  --   --  113*  --  115*  BUN 17 19  --   --  18  --   --  14  --  24*  CREATININE 1.39* 1.55*  --   --  1.28*  --   --  1.35*  --  1.35*  CALCIUM 8.5* 8.7*  --   --  8.7*  --   --  9.0  --  9.1  MG  --  2.5*  --   --   --  1.9 2.1  --  2.2 2.4  PHOS  --  6.0*  --   --   --  4.3 4.6  --  4.7* 4.2  4.2   Estimated Creatinine Clearance: 38 mL/min (A) (by C-G formula based on SCr of 1.35 mg/dL (H)). Liver & Pancreas: Recent Labs  Lab 09/14/21 0818 09/15/21 0600  AST 35  --   ALT 20  --   ALKPHOS 55  --   BILITOT 0.5  --   PROT 6.7  --   ALBUMIN 2.3* 2.4*   No results for input(s): LIPASE, AMYLASE in the last 168 hours. Recent Labs  Lab 09/14/21 1053  AMMONIA <10   Diabetic: No results for input(s): HGBA1C in the last 72 hours. Recent Labs  Lab 09/14/21 2023 09/14/21 2353 09/15/21 0414 09/15/21 0829 09/15/21 1223  GLUCAP 106* 115* 135* 87 97   Cardiac Enzymes: Recent Labs  Lab 09/15/21 0600  CKTOTAL 23*   No results for input(s): PROBNP in the last 8760 hours. Coagulation Profile: No results for input(s): INR, PROTIME in the last 168 hours. Thyroid Function Tests: Recent Labs    09/14/21 1053  TSH 1.890   Lipid Profile: No results for input(s): CHOL, HDL, LDLCALC, TRIG, CHOLHDL, LDLDIRECT in the last 72 hours. Anemia Panel: Recent Labs    09/14/21 1053 09/15/21 0600  VITAMINB12 445 473  FOLATE  --  16.1  FERRITIN  --  80  TIBC  --  374  IRON  --  57  RETICCTPCT  --  2.8   Urine analysis:    Component Value Date/Time   COLORURINE STRAW (A) 09/05/2021 1638   APPEARANCEUR CLEAR 09/05/2021 1638   LABSPEC 1.005 09/05/2021 1638   PHURINE 6.0 09/05/2021 1638   GLUCOSEU NEGATIVE 09/05/2021 1638   HGBUR SMALL (A) 09/05/2021 1638   BILIRUBINUR NEGATIVE 09/05/2021 1638   KETONESUR NEGATIVE 09/05/2021 1638   PROTEINUR 100 (A) 09/05/2021 1638   UROBILINOGEN  0.2 12/23/2017 1417   NITRITE NEGATIVE 09/05/2021 1638   LEUKOCYTESUR NEGATIVE 09/05/2021 1638   Sepsis Labs: Invalid input(s): PROCALCITONIN, [place]  Microbiology: Recent Results (from the past 240 hour(s))  Respiratory (~20 pathogens) panel by PCR     Status: None   Collection Time: 09/05/21 10:00 PM   Specimen: Nasopharyngeal Swab; Respiratory  Result Value Ref Range Status   Adenovirus NOT DETECTED NOT DETECTED Final   Coronavirus 229E NOT DETECTED NOT DETECTED Final    Comment: (NOTE) The Coronavirus on the Respiratory Panel, DOES NOT test for the novel  Coronavirus (2019 nCoV)    Coronavirus HKU1 NOT DETECTED NOT DETECTED Final   Coronavirus NL63 NOT DETECTED NOT DETECTED Final   Coronavirus OC43 NOT DETECTED NOT DETECTED Final   Metapneumovirus NOT DETECTED NOT DETECTED Final   Rhinovirus / Enterovirus NOT DETECTED NOT DETECTED Final   Influenza A NOT DETECTED NOT DETECTED Final   Influenza B NOT DETECTED NOT DETECTED Final   Parainfluenza Virus 1 NOT DETECTED NOT DETECTED Final   Parainfluenza Virus 2 NOT DETECTED NOT DETECTED Final   Parainfluenza Virus 3 NOT DETECTED  NOT DETECTED Final   Parainfluenza Virus 4 NOT DETECTED NOT DETECTED Final   Respiratory Syncytial Virus NOT DETECTED NOT DETECTED Final   Bordetella pertussis NOT DETECTED NOT DETECTED Final   Bordetella Parapertussis NOT DETECTED NOT DETECTED Final   Chlamydophila pneumoniae NOT DETECTED NOT DETECTED Final   Mycoplasma pneumoniae NOT DETECTED NOT DETECTED Final    Comment: Performed at [facility], [place] [address] [place], [place] [address]  Resp Panel by RT-PCR (Flu A&B, Covid) Nasopharyngeal Swab     Status: None   Collection Time: 09/05/21 10:00 PM   Specimen: Nasopharyngeal Swab; Nasopharyngeal(NP) swabs in vial transport medium  Result Value Ref Range Status   SARS Coronavirus 2 by RT PCR NEGATIVE NEGATIVE Final    Comment: (NOTE) SARS-CoV-2 target nucleic acids are NOT  DETECTED.  The SARS-CoV-2 RNA is generally detectable in upper respiratory specimens during the acute phase of infection. The lowest concentration of SARS-CoV-2 viral copies this assay can detect is 138 copies/mL. A negative result does not preclude SARS-Cov-2 infection and should not be used as the sole basis for treatment or other patient management decisions. A negative result may occur with  improper specimen collection/handling, submission of specimen other than nasopharyngeal swab, presence of viral mutation(s) within the areas targeted by this assay, and inadequate number of viral copies(<138 copies/mL). A negative result must be combined with clinical observations, patient history, and epidemiological information. The expected result is Negative.  Fact Sheet for Patients:  EntrepreneurPulse.com.au  Fact Sheet for Healthcare Providers:  IncredibleEmployment.be  This test is no t yet approved or cleared by the [place] FDA and  has been authorized for detection and/or diagnosis of SARS-CoV-2 by FDA under an Emergency Use Authorization (EUA). This EUA will remain  in effect (meaning this test can be used) for the duration of the COVID-19 declaration under Section 564(b)(1) of the Act, 21 U.S.C.section 360bbb-3(b)(1), unless the authorization is terminated  or revoked sooner.       Influenza A by PCR NEGATIVE NEGATIVE Final   Influenza B by PCR NEGATIVE NEGATIVE Final    Comment: (NOTE) The Xpert Xpress SARS-CoV-2/FLU/RSV plus assay is intended as an aid in the diagnosis of influenza from Nasopharyngeal swab specimens and should not be used as a sole basis for treatment. Nasal washings and aspirates are unacceptable for Xpert Xpress SARS-CoV-2/FLU/RSV testing.  Fact Sheet for Patients: EntrepreneurPulse.com.au  Fact Sheet for Healthcare Providers: IncredibleEmployment.be  This test is not yet  approved or cleared by the [place] FDA and has been authorized for detection and/or diagnosis of SARS-CoV-2 by FDA under an Emergency Use Authorization (EUA). This EUA will remain in effect (meaning this test can be used) for the duration of the COVID-19 declaration under Section 564(b)(1) of the Act, 21 U.S.C. section 360bbb-3(b)(1), unless the authorization is terminated or revoked.  Performed at [facility], [place] [address], [place], [place] [address]   MRSA Next Gen by PCR, Nasal     Status: Abnormal   Collection Time: 09/05/21 11:18 PM   Specimen: Nasal Mucosa; Nasal Swab  Result Value Ref Range Status   MRSA by PCR Next Gen DETECTED (A) NOT DETECTED Final    Comment: RESULT CALLED TO, READ BACK BY AND VERIFIED WITH: RN DEBBIE HOPPER 09/06/21@1 :07 BY TW (NOTE) The GeneXpert MRSA Assay (FDA approved for NASAL specimens only), is one component of a comprehensive MRSA colonization surveillance program. It is not intended to diagnose MRSA infection nor to guide or monitor  treatment for MRSA infections. Test performance is not FDA approved in patients less than 74 years old. Performed at [facility], [place] [address] [place], [place] [address]   Culture, Respiratory w Gram Stain     Status: None   Collection Time: 09/06/21  8:58 AM   Specimen: Tracheal Aspirate; Respiratory  Result Value Ref Range Status   Specimen Description TRACHEAL ASPIRATE  Final   Special Requests NONE  Final   Gram Stain   Final    MODERATE WBC PRESENT, PREDOMINANTLY PMN MODERATE GRAM POSITIVE COCCI Performed at [facility], [place] [address] [place], [place] [address]    Culture MODERATE STREPTOCOCCUS PNEUMONIAE  Final   Report Status 09/08/2021 FINAL  Final   Organism ID, Bacteria STREPTOCOCCUS PNEUMONIAE  Final      Susceptibility   Streptococcus pneumoniae - MIC*    ERYTHROMYCIN >=8 RESISTANT Resistant     LEVOFLOXACIN 0.5 SENSITIVE Sensitive     VANCOMYCIN 0.5 SENSITIVE  Sensitive     PENICILLIN (meningitis) 1 RESISTANT Resistant     PENO - penicillin 1      PENICILLIN (non-meningitis) 1 SENSITIVE Sensitive     PENICILLIN (oral) 1 INTERMEDIATE Intermediate     CEFTRIAXONE (non-meningitis) 1 SENSITIVE Sensitive     CEFTRIAXONE (meningitis) 1 INTERMEDIATE Intermediate     * MODERATE STREPTOCOCCUS PNEUMONIAE  Blood culture (routine x 2)     Status: None   Collection Time: 09/06/21 10:06 AM   Specimen: BLOOD RIGHT HAND  Result Value Ref Range Status   Specimen Description BLOOD RIGHT HAND  Final   Special Requests   Final    BOTTLES DRAWN AEROBIC AND ANAEROBIC Blood Culture adequate volume   Culture   Final    NO GROWTH 5 DAYS Performed at [facility], [address] [place], [place] [address]    Report Status 09/11/2021 FINAL  Final  Blood culture (routine x 2)     Status: None   Collection Time: 09/06/21 10:21 AM   Specimen: BLOOD  Result Value Ref Range Status   Specimen Description BLOOD RIGHT THUMB  Final   Special Requests   Final    BOTTLES DRAWN AEROBIC AND ANAEROBIC Blood Culture results may not be optimal due to an inadequate volume of blood received in culture bottles   Culture   Final    NO GROWTH 5 DAYS Performed at [facility], [place] [address], [place], [place] [address]    Report Status 09/11/2021 FINAL  Final    Radiology Studies: No results found.   Previn Jian T. [place]  If 7PM-7AM, please contact night-coverage [email or web address] 09/15/2021, 1:12 PM

## 2021-09-15 NOTE — Progress Notes (Signed)
Speech Language Pathology Treatment: Dysphagia  Patient Details Name: Sherry Baker MRN: [id number] DOB: 08-Aug-1966 Today's Date: 09/15/2021 Time: 9833-8250 SLP Time Calculation (min) (ACUTE ONLY): 15 min  Assessment / Plan / Recommendation Clinical Impression  Pt seen for dysphagia tx with intake of various consistencies including: ice chips, thin via tsp, and puree.  Pt refused consumption of solids this session d/t decreased satiety.  Pt now has Cortrak TF placed for nutrition/hydration purposes.  Pt with min intelligible utterances that improved to 50-75% intelligibility with shared context/familiar information such as name/DOB.  Low intensity, wet vocal quality noted prior to intake of POs.  Pt with oral holding and limited oral manipulation with given consistencies and refusal of solids this session despite encouragement.  Immediate cough noted with intake of thin liquids and ice chips.  Puree elicited a delayed cough response.  Encouraged pt to use oral suctioning prn for secretion management as pooling in oral cavity noted prior to intake.  Recommend continue NPO status with oral care QID/medications via alternative means.  ST will continue to f/u in acute setting for PO readiness.    HPI HPI: 55 yo F admitted to the hospital for cocaine intoxication and etoh withdrawal. In the ED the pt was exhibiting hypertension, tachycardia and reportedly intermittent agitation so received 20mg  of geodon and 2mg  of ativan. Pt was intermittently somnolent prior to and after the meds in the ED. PCCM consulted for evaluation and pt was found to not be able to protect her airway, subsequently was intubated and admitted. CXR 11/7 concerning for BLL opacities. Pt with ETT 11/3-11/10. Pt is known to this service from prior admissions.  She was unable to participate in assessment in May of this year.  In August 2020 pt was seen at bedside with recommendations for mechanical soft solids and thin liquid.  Pt with prior  medical history of ETOH use disorder, complicated etoh withdrawals requiring prior intubation; Cocaine abuse; HTN; CKD3; GERD; Bipolar 1; Depression; Tobacco dependence; Rectal cancer s/p resection, chemo, and s/p ostomy; BSE completed 09/13/21 with recommendation for NPO with potential temporary non-oral feeding; Cortrak placed on 09/13/21.  SLE f/u for PO readiness.      SLP Plan  Continue with current plan of care      Recommendations for follow up therapy are one component of a multi-disciplinary discharge planning process, led by the attending physician.  Recommendations may be updated based on patient status, additional functional criteria and insurance authorization.    Recommendations  Diet recommendations: NPO Medication Administration: Via alternative means                Oral Care Recommendations: Oral care QID Follow Up Recommendations:  (TBD) Assistance recommended at discharge:  (TBD) SLP Visit Diagnosis: Dysphagia, unspecified (R13.10) Plan: Continue with current plan of care                       Elvina Sidle, M.S., CCC-SLP  09/15/2021, 12:29 PM

## 2021-09-15 NOTE — Evaluation (Signed)
Physical Therapy Evaluation Patient Details Name: Sherry Baker MRN: [id number] DOB: [birth year] Today's Date: 09/15/2021  History of Present Illness  Pt is a 55 y/o female brought to ED after found to be minimally responsive behind a dumpster surrounded by beer cans.  She was hypotensive with tachycardia, agitation and intermittent somnolence.  She was intubated for airway protection and admitted to ICU for complicated EtOH withdrawal.  Initially treated with Precedex which was discontinued due to bradycardia.  She was a started on fentanyl drip, Librium and Ativan as needed.  She also received empiric antibiotics from 11/4-11/8 with Vanco and Unasyn then on Vanco and CTX presumably for respiratory infection. Eventually extubated on 11/10, and transferred to [facility] service on 11/12.  She is on TF via cortrak for dysphagia. PMH including but not limited to EtOH abuse, complicated EtOH withdrawal requiring intubation, cocaine use, tobacco use disorder, CKD-3, HTN, depression, bipolar disorder, vitamin D deficiency, vitamin B12 deficiency, rectal cancer s/p resection, chemo and ostomy.   Clinical Impression  Pt presented supine in bed with HOB elevated, awake and willing to participate in therapy session. Pt was unable to verbalize anything clearly in response to PT's questions or commands. She was able to intermittently follow simple one-step commands with verbal and tactile cueing. She required mod-max A for bed mobility and mod-max A to maintain an upright sitting position at EOB. She indicated pain in bilateral LEs intermittently throughout and became tearful intermittently throughout session as well. No family/caregivers present to provide any reliable information at the time of evaluation. Pt would continue to benefit from skilled physical therapy services at this time while admitted and after d/c to address the below listed limitations in order to improve overall safety and independence with functional  mobility.         Recommendations for follow up therapy are one component of a multi-disciplinary discharge planning process, led by the attending physician.  Recommendations may be updated based on patient status, additional functional criteria and insurance authorization.  Follow Up Recommendations Skilled nursing-short term rehab (<3 hours/day)    Assistance Recommended at Discharge Frequent or constant Supervision/Assistance  Functional Status Assessment Patient has had a recent decline in their functional status and demonstrates the ability to make significant improvements in function in a reasonable and predictable amount of time.  Equipment Recommendations  Other (comment) (defer to next venue of care)    Recommendations for Other Services       Precautions / Restrictions Precautions Precautions: Fall Precaution Comments: NGT, colostomy Restrictions Weight Bearing Restrictions: No      Mobility  Bed Mobility Overal bed mobility: Needs Assistance Bed Mobility: Supine to Sit;Sit to Supine     Supine to sit: Max assist Sit to supine: Mod assist   General bed mobility comments: increased time and effort, cueing needed for sequencing, mod-max A required to assist with bilateral LE movement onto and off of bed as well as for trunk management    Transfers                   General transfer comment: unable to safely complete this date    Ambulation/Gait                  Stairs            Wheelchair Mobility    Modified Rankin (Stroke Patients Only)       Balance Overall balance assessment: Needs assistance Sitting-balance support: Single extremity supported;Bilateral upper extremity supported  Sitting balance-Leahy Scale: Poor Sitting balance - Comments: pt requiring mod-max A throughout to maintain an upright sitting position for ~5 minutes Postural control: Left lateral lean                                   Pertinent  Vitals/Pain Pain Assessment: Faces Faces Pain Scale: Hurts worst Pain Location: bilateral LEs Pain Descriptors / Indicators: Grimacing;Crying Pain Intervention(s): Monitored during session;Repositioned    Home Living Family/patient expects to be discharged to:: Unsure                   Additional Comments: No family/caregivers present to provide any reliable information; pt unable to answer questions regarding home or PLOF    Prior Function Prior Level of Function : Patient poor historian/Family not available                     Hand Dominance   Dominant Hand: Right    Extremity/Trunk Assessment   Upper Extremity Assessment Upper Extremity Assessment: Difficult to assess due to impaired cognition    Lower Extremity Assessment Lower Extremity Assessment: Difficult to assess due to impaired cognition       Communication   Communication: Expressive difficulties  Cognition Arousal/Alertness: Awake/alert Behavior During Therapy: Flat affect Overall Cognitive Status: Difficult to assess                                 General Comments: pt able to follow simple one-step commands intermittently; unable to verbalize or respond to any questions        General Comments      Exercises     Assessment/Plan    PT Assessment Patient needs continued PT services  PT Problem List Decreased strength;Decreased range of motion;Decreased activity tolerance;Decreased balance;Decreased mobility;Decreased coordination;Decreased cognition;Decreased knowledge of use of DME;Decreased safety awareness;Decreased knowledge of precautions;Pain       PT Treatment Interventions DME instruction;Gait training;Stair training;Functional mobility training;Therapeutic activities;Therapeutic exercise;Balance training;Neuromuscular re-education;Cognitive remediation;Patient/family education    PT Goals (Current goals can be found in the Care Plan section)  Acute Rehab PT  Goals Patient Stated Goal: unable to state PT Goal Formulation: Patient unable to participate in goal setting Time For Goal Achievement: 09/29/21 Potential to Achieve Goals: Fair    Frequency Min 2X/week   Barriers to discharge Decreased caregiver support      Co-evaluation               AM-PAC PT "6 Clicks" Mobility  Outcome Measure Help needed turning from your back to your side while in a flat bed without using bedrails?: A Lot Help needed moving from lying on your back to sitting on the side of a flat bed without using bedrails?: A Lot Help needed moving to and from a bed to a chair (including a wheelchair)?: Total Help needed standing up from a chair using your arms (e.g., wheelchair or bedside chair)?: Total Help needed to walk in hospital room?: Total Help needed climbing 3-5 steps with a railing? : Total 6 Click Score: 8    End of Session   Activity Tolerance: Patient limited by fatigue;Patient limited by pain Patient left: in bed;with call bell/phone within reach;with restraints reapplied Nurse Communication: Mobility status PT Visit Diagnosis: Other abnormalities of gait and mobility (R26.89)    Time: 5643-3295 PT Time Calculation (min) (ACUTE  ONLY): 14 min   Charges:   PT Evaluation $PT Eval Moderate Complexity: 1 Mod          Eduard Clos, PT, DPT  Acute Rehabilitation Services Office [place] 09/15/2021, 10:20 AM

## 2021-09-16 DIAGNOSIS — I10 Essential (primary) hypertension: Secondary | ICD-10-CM | POA: Diagnosis not present

## 2021-09-16 DIAGNOSIS — R5381 Other malaise: Secondary | ICD-10-CM | POA: Diagnosis not present

## 2021-09-16 DIAGNOSIS — I5032 Chronic diastolic (congestive) heart failure: Secondary | ICD-10-CM | POA: Diagnosis not present

## 2021-09-16 DIAGNOSIS — F419 Anxiety disorder, unspecified: Secondary | ICD-10-CM | POA: Diagnosis not present

## 2021-09-16 LAB — GLUCOSE, CAPILLARY
Glucose-Capillary: 98 mg/dL (ref 70–99)
Glucose-Capillary: 99 mg/dL (ref 70–99)
Glucose-Capillary: 93 mg/dL (ref 70–99)
Glucose-Capillary: 96 mg/dL (ref 70–99)

## 2021-09-16 LAB — PROCALCITONIN: Procalcitonin: <0.1 ng/mL

## 2021-09-16 LAB — PHOSPHORUS: Phosphorus: 4.7 mg/dL — ABNORMAL HIGH (ref 2.5–4.6)

## 2021-09-16 LAB — MAGNESIUM: Magnesium: 2.4 mg/dL (ref 1.7–2.4)

## 2021-09-16 MED ORDER — SODIUM CHLORIDE 0.9% FLUSH: 10.0000 mL | INTRAVENOUS | Status: DC | PRN

## 2021-09-16 MED ORDER — CARVEDILOL 12.5 MG PO TABS
12.5000 mg | ORAL_TABLET | Freq: Two times a day (BID) | ORAL | Status: DC
Start: 2021-09-16 — End: 2021-09-16

## 2021-09-16 MED ORDER — HALOPERIDOL LACTATE 5 MG/ML IJ SOLN
2.0000 mg | Freq: Four times a day (QID) | INTRAMUSCULAR | Status: DC | PRN
  Administered 2021-09-16 – 2021-09-17 (×2): 2 mg via INTRAVENOUS
  Filled 2021-09-16 (×2): qty 1

## 2021-09-16 MED ORDER — CARVEDILOL 6.25 MG PO TABS
6.2500 mg | ORAL_TABLET | Freq: Two times a day (BID) | ORAL | Status: DC
Start: 2021-09-16 — End: 2021-09-20
  Administered 2021-09-16 – 2021-09-20 (×9): 6.25 mg
  Filled 2021-09-16 (×9): qty 1

## 2021-09-16 MED ORDER — LOSARTAN POTASSIUM 25 MG PO TABS
25.0000 mg | ORAL_TABLET | Freq: Every day | ORAL | Status: DC
  Administered 2021-09-16 – 2021-09-18 (×3): 25 mg
  Filled 2021-09-16 (×3): qty 1

## 2021-09-16 MED ORDER — SODIUM CHLORIDE 0.9% FLUSH
10.0000 mL | Freq: Two times a day (BID) | INTRAVENOUS | Status: DC
  Administered 2021-09-16 – 2021-09-24 (×13): 10 mL

## 2021-09-16 NOTE — Progress Notes (Addendum)
At 1620 this nurse attempted to turn the patient to do her dressing change on her sacrum. The patient began to become violent and started to kick this nurse, throw her fists and started to scream uncontrollably. She called this nurse the N word. This nurse attempted to calm the patient down and notified the charge nurse. The MD was notified and put an order for PRN haldol. PRN haldol was administered. Patient was assessed again and is now calm and resting. Will continue to monitor

## 2021-09-16 NOTE — Progress Notes (Signed)
PROGRESS NOTE  EYLIN Baker XLK:[id number] DOB: 08-Oct-1966   PCP: Default, Provider, MD  Patient is from: Home  DOA: 09/05/2021 LOS: 11  Chief complaints:  No chief complaint on file.    Brief Narrative / Interim history: 55 year old F with PMH of EtOH abuse, complicated EtOH withdrawal requiring intubation, cocaine use, tobacco use disorder, CKD-3, HTN, depression, bipolar disorder, vitamin D deficiency, vitamin B12 deficiency, rectal cancer s/p resection, chemo and ostomy brought to ED after found to be minimally responsive behind a dumpster surrounded by beer cans.  She was hypotensive with tachycardia, agitation and intermittent somnolence.  She was intubated for airway protection and admitted to ICU for complicated EtOH withdrawal.  Initially treated with Precedex which was discontinued due to bradycardia.  She was a started on fentanyl drip, Librium and Ativan as needed.  She also received empiric antibiotics from 11/4-11/8 with Vanco and Unasyn then on Vanco and CTX presumably for respiratory infection. Eventually extubated on 11/10, and transferred to [facility] service on 11/12.  Encephalopathy improving but continues to require abdominal belt for safety.  Remains on TF via cortrak for dysphagia.    Subjective: Seen and examined earlier this morning.  No major events overnight of this morning.  Crystal, cousin at bedside.  Patient is awake and alert.  She is oriented to self, person and place but not time.  Somewhat tearful.  Crystal thinks she might have been embarrassed.  She denies pain.  She asked when she can go home.  Objective: Vitals:   09/15/21 2349 09/16/21 0357 09/16/21 0500 09/16/21 0812  BP: (!) 141/79 (!) 175/88  (!) 171/101  Pulse: 83 79  83  Resp: (!) 24 (!) 25  20  Temp: 99.5 F (37.5 C) 98.9 F (37.2 C)  98.5 F (36.9 C)  TempSrc: Oral Oral  Oral  SpO2: 100% 100%  100%  Weight:   47.6 kg   Height:        Intake/Output Summary (Last 24 hours) at 09/16/2021  1153 Last data filed at 09/16/2021 0503 Gross per 24 hour  Intake 1924 ml  Output 1550 ml  Net 374 ml   Filed Weights   09/14/21 0407 09/15/21 0418 09/16/21 0500  Weight: 48.8 kg 49.9 kg 47.6 kg    Examination:  GENERAL: Frail looking elderly female.  No apparent distress. HEENT: MMM.  Vision and hearing grossly intact.  NECK: Supple.  No apparent JVD.  RESP: 100% on RA.  No IWOB.  Fair aeration bilaterally. CVS:  RRR. Heart sounds normal.  ABD/GI/GU: BS+. Abd soft, NTND.  Safety belt across abdomen. MSK/EXT:  Moves extremities. No apparent deformity. No edema.  SKIN: no apparent skin lesion or wound NEURO: Awake and alert. Oriented to self, place and person but not time.  No apparent focal neuro deficit. PSYCH: Calm.  Somewhat tearful  Procedures:  11/3-11/10-intubation and mechanical ventilation.  Microbiology summarized: 11/3-COVID-19, influenza PCR and full RVP negative 11/3-MRSA PCR positive 11/4-respiratory culture with moderate Streptococcus pneumonia sensitive to CTX 11/4-blood cultures negative.  Assessment & Plan: Acute metabolic encephalopathy-likely related to alcohol.  CTH, ammonia, TSH, B12 and RPR unrevealing. No focal neuro deficit to suggest CVA but limited exam.  Encephalopathy improving. -Started high-dose thiamine on 11/12-continue -Aspiration precaution. -Continue abdominal belt for safety. -Passed voiding trial on 11/13.  Complicated alcohol withdrawal requiring intubation and mechanical ventilation Cocaine use disorder -Completed Librium taper. -Continue multivitamins, folic acid and thiamine. -Discontinued CIWA monitoring. -TOC consulted for counseling or resources when she wakes up  Dysphagia: Likely from intubation and encephalopathy.  SLP recommended n.p.o. -On TF via Cortrak -Continue SLP eval  Abnormal EKG with ST changes Mildly elevated troponin, 24> 54>> 66-likely demand ischemia. Chronic diastolic CHF-TTE on 00/1 with LVEF of 55 to  60%, G1 DD, no RWMA Uncontrolled hypertension: Normotensive. -Added Coreg 6.25 mg twice daily on 11/12. -Continue amlodipine 10 mg daily -Add p.o. hydralazine as needed   Tobacco use disorder -Encourage cessation when able to comprehend -Nicotine patch   AKI on CKD-3A/azotemia: AKI resolved. Recent Labs    09/05/21 1506 09/06/21 0212 09/07/21 0203 09/08/21 0313 09/09/21 7494 09/10/21 0430 09/11/21 0228 09/12/21 0223 09/14/21 0818 09/15/21 0600  BUN 23* 21* 18 15 15 17 19 18 14  24*  CREATININE 1.71* 1.40* 1.32* 1.27* 1.48* 1.39* 1.55* 1.28* 1.35* 1.35*  -Continue monitoring  History of vitamin B12 deficiency-B12-473 now History of vitamin D deficiency-vitamin D slightly low at 21.16. -Vitamin D 800 units daily  History of rectal cancer s/p resection w/ostomy -Routine ostomy care   GERD -Continue Protonix   Anxiety/depression/bipolar 1 disorder: Not able to assess this given her mental status. -Continue BuSpar, Celexa and Seroquel   RUE swelling/superficial thrombophlebitis -Axillary line was discontinued  Physical deconditioning due to acute illness and chronic alcohol abuse -PT/OT-recommends SNF.  Severe malnutrition: as evidenced by low BMI and significant muscle mass and subcu fat loss. Body mass index is 19.19 kg/m. Nutrition Problem: Severe Malnutrition Etiology: chronic illness (cirrhosis, polysubstance abuse) Signs/Symptoms: severe muscle depletion, severe fat depletion Interventions: Tube feeding, Prostat, MVI  Pressure skin injury: POA Pressure Injury 06/23/21 Coccyx Lower;Medial Unstageable - Full thickness tissue loss in which the base of the injury is covered by slough (yellow, tan, gray, green or brown) and/or eschar (tan, brown or black) in the wound bed. (Active)  06/23/21 0100  Location: Coccyx  Location Orientation: Lower;Medial  Staging: Unstageable - Full thickness tissue loss in which the base of the injury is covered by slough (yellow,  tan, gray, green or brown) and/or eschar (tan, brown or black) in the wound bed.  Wound Description (Comments):   Present on Admission: Yes     Pressure Injury 09/05/21 Coccyx Mid Stage 3 -  Full thickness tissue loss. Subcutaneous fat may be visible but bone, tendon or muscle are NOT exposed. dehissed surgical wound from 2018; NOT pressure (Active)  09/05/21 2300  Location: Coccyx  Location Orientation: Mid  Staging: Stage 3 -  Full thickness tissue loss. Subcutaneous fat may be visible but bone, tendon or muscle are NOT exposed.  Wound Description (Comments): dehissed surgical wound from 2018; NOT pressure  Present on Admission: Yes   DVT prophylaxis:  heparin injection 5,000 Units Start: 09/06/21 0600 SCDs Start: 09/05/21 2126  Code Status: Full code Family Communication: Updated Crystal, who identifies herself as a cousin.  She is listed as patient contact. Level of care: Med-Surg Status is: Inpatient  Remains inpatient appropriate because: Encephalopathy soft abdominal restraints and dysphagia requiring tube feed.   Final disposition: Likely SNF.    Consultants:  Pulmonology   Sch Meds:  Scheduled Meds:  amLODipine  10 mg Per Tube Daily   bethanechol  5 mg Per Tube TID   busPIRone  15 mg Per Tube BID   carvedilol  6.25 mg Per Tube BID WC   Chlorhexidine Gluconate Cloth  6 each Topical Daily   cholecalciferol  800 Units Oral Daily   citalopram  10 mg Per Tube Daily   docusate  100 mg Per Tube BID  feeding supplement (PROSource TF)  45 mL Per Tube BID   folic acid  1 mg Per Tube Daily   heparin  5,000 Units Subcutaneous Q8H   losartan  25 mg Per Tube Daily   mouth rinse  15 mL Mouth Rinse BID   multivitamin with minerals  1 tablet Per Tube Daily   nicotine  14 mg Transdermal Daily   pantoprazole sodium  40 mg Per Tube QHS   polyethylene glycol  17 g Per Tube Daily   QUEtiapine  25 mg Per Tube BID   [START ON 09/21/2021] thiamine  100 mg Oral Daily    Continuous Infusions:  feeding supplement (OSMOLITE 1.5 CAL) 1,000 mL (09/16/21 0451)   thiamine injection     PRN Meds:.acetaminophen (TYLENOL) oral liquid 160 mg/5 mL, labetalol  Antimicrobials: Anti-infectives (From admission, onward)    Start     Dose/Rate Route Frequency Ordered Stop   09/08/21 0830  cefTRIAXone (ROCEPHIN) 2 g in sodium chloride 0.9 % 100 mL IVPB       Note to Pharmacy: Tolerated unasyn   2 g 200 mL/hr over 30 Minutes Intravenous Every 24 hours 09/08/21 0731 09/10/21 0832   09/07/21 1000  vancomycin (VANCOREADY) IVPB 500 mg/100 mL  Status:  Discontinued        500 mg 100 mL/hr over 60 Minutes Intravenous Every 24 hours 09/06/21 1452 09/07/21 1037   09/06/21 1633  Ampicillin-Sulbactam (UNASYN) 3 g in sodium chloride 0.9 % 100 mL IVPB  Status:  Discontinued        3 g 200 mL/hr over 30 Minutes Intravenous Every 8 hours 09/06/21 1258 09/07/21 1037   09/06/21 1545  vancomycin (VANCOREADY) IVPB 1000 mg/200 mL        1,000 mg 200 mL/hr over 60 Minutes Intravenous  Once 09/06/21 1452 09/06/21 1629   09/06/21 0915  Ampicillin-Sulbactam (UNASYN) 3 g in sodium chloride 0.9 % 100 mL IVPB  Status:  Discontinued        3 g 200 mL/hr over 30 Minutes Intravenous Every 12 hours 09/06/21 0817 09/06/21 1258        I have personally reviewed the following labs and images: CBC: Recent Labs  Lab 09/10/21 0430 09/11/21 0228 09/11/21 0249 09/11/21 0910 09/12/21 0223 09/14/21 0818 09/15/21 0600  WBC 3.3* 3.9*  --   --  3.7* 5.2 6.2  NEUTROABS  --  2.6  --   --   --  3.3  --   HGB 9.0* 8.3* 8.8* 8.2* 8.9* 11.1* 11.4*  HCT 29.4* 27.3* 26.0* 24.0* 29.1* 35.5* 35.4*  MCV 93.0 94.1  --   --  92.7 92.9 89.4  PLT 168 177  --   --  197 280 358   BMP &GFR Recent Labs  Lab 09/10/21 0430 09/11/21 0228 09/11/21 0249 09/11/21 0910 09/12/21 0223 09/13/21 1710 09/14/21 0459 09/14/21 0818 09/14/21 1609 09/15/21 0600 09/15/21 1917 09/16/21 0641  NA 138 137 140 140 137   --   --  136  --  137  --   --   K 3.7 4.2 4.3 4.0 4.2  --   --  4.5  --  4.4  --   --   CL 105 101  --   --  106  --   --  103  --  107  --   --   CO2 24 28  --   --  24  --   --  21*  --  22  --   --  GLUCOSE 102* 126*  --   --  96  --   --  113*  --  115*  --   --   BUN 17 19  --   --  18  --   --  14  --  24*  --   --   CREATININE 1.39* 1.55*  --   --  1.28*  --   --  1.35*  --  1.35*  --   --   CALCIUM 8.5* 8.7*  --   --  8.7*  --   --  9.0  --  9.1  --   --   MG  --  2.5*  --   --   --    < > 2.1  --  2.2 2.4 2.4 2.4  PHOS  --  6.0*  --   --   --    < > 4.6  --  4.7* 4.2  4.2 4.4 4.7*   < > = values in this interval not displayed.   Estimated Creatinine Clearance: 36.2 mL/min (A) (by C-G formula based on SCr of 1.35 mg/dL (H)). Liver & Pancreas: Recent Labs  Lab 09/14/21 0818 09/15/21 0600  AST 35  --   ALT 20  --   ALKPHOS 55  --   BILITOT 0.5  --   PROT 6.7  --   ALBUMIN 2.3* 2.4*   No results for input(s): LIPASE, AMYLASE in the last 168 hours. Recent Labs  Lab 09/14/21 1053  AMMONIA <10   Diabetic: No results for input(s): HGBA1C in the last 72 hours. Recent Labs  Lab 09/15/21 1223 09/15/21 1615 09/15/21 2137 09/15/21 2347 09/16/21 0355  GLUCAP 97 106* 96 113* 98   Cardiac Enzymes: Recent Labs  Lab 09/15/21 0600  CKTOTAL 23*   No results for input(s): PROBNP in the last 8760 hours. Coagulation Profile: No results for input(s): INR, PROTIME in the last 168 hours. Thyroid Function Tests: Recent Labs    09/14/21 1053  TSH 1.890   Lipid Profile: No results for input(s): CHOL, HDL, LDLCALC, TRIG, CHOLHDL, LDLDIRECT in the last 72 hours. Anemia Panel: Recent Labs    09/14/21 1053 09/15/21 0600  VITAMINB12 445 473  FOLATE  --  16.1  FERRITIN  --  80  TIBC  --  374  IRON  --  57  RETICCTPCT  --  2.8   Urine analysis:    Component Value Date/Time   COLORURINE STRAW (A) 09/05/2021 1638   APPEARANCEUR CLEAR 09/05/2021 1638   LABSPEC 1.005  09/05/2021 1638   PHURINE 6.0 09/05/2021 1638   GLUCOSEU NEGATIVE 09/05/2021 1638   HGBUR SMALL (A) 09/05/2021 1638   BILIRUBINUR NEGATIVE 09/05/2021 1638   KETONESUR NEGATIVE 09/05/2021 1638   PROTEINUR 100 (A) 09/05/2021 1638   UROBILINOGEN 0.2 12/23/2017 1417   NITRITE NEGATIVE 09/05/2021 1638   LEUKOCYTESUR NEGATIVE 09/05/2021 1638   Sepsis Labs: Invalid input(s): PROCALCITONIN, [place]  Microbiology: No results found for this or any previous visit (from the past 240 hour(s)).   Radiology Studies: No results found.   Jaziya Obarr T. [place]  If 7PM-7AM, please contact night-coverage [email or web address] 09/16/2021, 11:53 AM

## 2021-09-16 NOTE — Progress Notes (Signed)
Speech Language Pathology Treatment: Dysphagia  Patient Details Name: Sherry Baker MRN: [id number] DOB: January 30, 1966 Today's Date: 09/16/2021 Time: 5916-3846 SLP Time Calculation (min) (ACUTE ONLY): 25 min  Assessment / Plan / Recommendation Clinical Impression  Pt seen at bedside to assess readiness to begin PO intake. Pt was awake and alert, very restless in the bed. She was able to tell me her first name at whisper level. Oral care was attempted using suction. Pt was very resistant to oral care, biting on suction swab and swatting my hand away. Following limited oral care, pt accepted individual ice chips with adequate oral prep and clearing. No anterior leakage noted. Cough response noted after the swallow. Pt then accepted a few very small boluses of applesauce, with similar response. Congested nonproductive cough noted. Pt then stated "I don't want that" and declined further trials. Based on bedside presentation, pt's risk of aspiration continues to be quite high. Recommend continuing with NPO status. Hopefully as mentation continues to improve, her appropriateness for PO intake and/or instrumental swallow evaluation will as well. Cortrak is now in place providing nutrition.   HPI HPI: 55 yo F admitted to the hospital for cocaine intoxication and etoh withdrawal. In the ED the pt was exhibiting hypertension, tachycardia and reportedly intermittent agitation so received 20mg  of geodon and 2mg  of ativan. Pt was intermittently somnolent prior to and after the meds in the ED. PCCM consulted for evaluation and pt was found to not be able to protect her airway, subsequently was intubated and admitted. CXR 11/7 concerning for BLL opacities. Pt with ETT 11/3-11/10. Pt is known to this service from prior admissions.  She was unable to participate in assessment in May of this year.  In August 2020 pt was seen at bedside with recommendations for mechanical soft solids and thin liquid.  Pt with prior medical  history of ETOH use disorder, complicated etoh withdrawals requiring prior intubation; Cocaine abuse; HTN; CKD3; GERD; Bipolar 1; Depression; Tobacco dependence; Rectal cancer s/p resection, chemo, and s/p ostomy; BSE completed 09/13/21 with recommendation for NPO with potential temporary non-oral feeding; Cortrak placed on 09/13/21.  SLP f/u for PO readiness.      SLP Plan  Continue with current plan of care      Recommendations for follow up therapy are one component of a multi-disciplinary discharge planning process, led by the attending physician.  Recommendations may be updated based on patient status, additional functional criteria and insurance authorization.    Recommendations  Diet recommendations: NPO Medication Administration: Via alternative means                Oral Care Recommendations: Oral care QID;Oral care prior to ice chip/H20 Follow Up Recommendations: Other (comment) (TBD) Assistance recommended at discharge: Other (comment) (TBD) SLP Visit Diagnosis: Dysphagia, unspecified (R13.10) Plan: Continue with current plan of care       [place]. Quentin Ore, [facility], [place] Speech Language Pathologist Office: [phone]  Shonna Chock  09/16/2021, 2:20 PM

## 2021-09-16 NOTE — Evaluation (Signed)
Occupational Therapy Evaluation Patient Details Name: Sherry Baker MRN: [id number] DOB: [birth year] Today's Date: 09/16/2021   History of Present Illness Pt is a 55 y/o female brought to ED after found to be minimally responsive behind a dumpster surrounded by beer cans.  She was hypotensive with tachycardia, agitation and intermittent somnolence.  She was intubated for airway protection and admitted to ICU for complicated EtOH withdrawal.  Initially treated with Precedex which was discontinued due to bradycardia.  She was a started on fentanyl drip, Librium and Ativan as needed.  She also received empiric antibiotics from 11/4-11/8 with Vanco and Unasyn then on Vanco and CTX presumably for respiratory infection. Eventually extubated on 11/10, and transferred to [facility] service on 11/12.  She is on TF via cortrak for dysphagia. PMH including but not limited to EtOH abuse, complicated EtOH withdrawal requiring intubation, cocaine use, tobacco use disorder, CKD-3, HTN, depression, bipolar disorder, vitamin D deficiency, vitamin B12 deficiency, rectal cancer s/p resection, chemo and ostomy.   Clinical Impression   Pt presents with decreased balance, activity tolerance, and cognition. During eval, pt attempt verbalizations that were largely incomprehensible and inconsistently answered yes/no questions with gestures. No family present to obtain PLOF/history. Pt exhibited little volitional movement with Max verbal and physical cues during eval. Currently requiring Max - Total A for all ADLs and bed mobility, unable to attempt standing. Pt would likely benefit from SNF to maximize safety/independence with ADLs and functional transfers/mobility prior to return home. Will follow acutely.      Recommendations for follow up therapy are one component of a multi-disciplinary discharge planning process, led by the attending physician.  Recommendations may be updated based on patient status, additional functional criteria  and insurance authorization.   Follow Up Recommendations  Skilled nursing-short term rehab (<3 hours/day)    Assistance Recommended at Discharge Frequent or constant Supervision/Assistance  Functional Status Assessment  Patient has had a recent decline in their functional status and/or demonstrates limited ability to make significant improvements in function in a reasonable and predictable amount of time  Equipment Recommendations  Other (comment) (Defer)    Recommendations for Other Services       Precautions / Restrictions Precautions Precautions: Fall Precaution Comments: NGT, colostomy Restrictions Weight Bearing Restrictions: No      Mobility Bed Mobility Overal bed mobility: Needs Assistance Bed Mobility: Supine to Sit;Sit to Supine     Supine to sit: Max assist Sit to supine: Max assist        Transfers                   General transfer comment: Unable to attempt      Balance Overall balance assessment: Needs assistance Sitting-balance support: Bilateral upper extremity supported;Feet supported Sitting balance-Leahy Scale: Poor                                     ADL either performed or assessed with clinical judgement   ADL Overall ADL's : Needs assistance/impaired Eating/Feeding: Total assistance;NPO Eating/Feeding Details (indicate cue type and reason): tube feeds Grooming: Maximal assistance;Bed level   Upper Body Bathing: Maximal assistance;Bed level   Lower Body Bathing: Total assistance   Upper Body Dressing : Maximal assistance;Bed level   Lower Body Dressing: Total assistance;Bed level       Toileting- Clothing Manipulation and Hygiene: Total assistance;Bed level Toileting - Clothing Manipulation Details (indicate cue type and reason):  ostomy       General ADL Comments: Limited by pain, balance, activity tolerance and cognition. Cognition appears to be most significant limiting factor at this point, as pt with  limited ability to follow commands or meaningfully participate in activity at this time.     Vision         Perception     Praxis      Pertinent Vitals/Pain Pain Assessment: Faces Faces Pain Scale: Hurts little more Pain Location: bilateral LEs Pain Descriptors / Indicators: Grimacing;Crying Pain Intervention(s): Limited activity within patient's tolerance;Monitored during session     Hand Dominance Right   Extremity/Trunk Assessment Upper Extremity Assessment Upper Extremity Assessment: Difficult to assess due to impaired cognition (No impairments noted with PROM, however pt with very little volitional movement despite Max cueing/encouragement.)   Lower Extremity Assessment Lower Extremity Assessment: Difficult to assess due to impaired cognition       Communication Communication Communication: Expressive difficulties (Difficult to understand verbalizations)   Cognition Arousal/Alertness: Awake/alert Behavior During Therapy: Flat affect Overall Cognitive Status: Difficult to assess                                 General Comments: Exhibits limited ability to follow basic one-step commands, however unsure if volitional or due to cognitive defict as pt with poor ability to communicate. Attempted verbalizations however mainly incomprehensible.     General Comments       Exercises     Shoulder Instructions      Home Living Family/patient expects to be discharged to:: Unsure                                 Additional Comments: No family/caregivers present to provide any reliable information; pt unable to answer questions regarding home or PLOF      Prior Functioning/Environment Prior Level of Function : Patient poor historian/Family not available                        OT Problem List: Decreased strength;Decreased activity tolerance;Impaired balance (sitting and/or standing);Decreased coordination;Decreased  cognition;Decreased safety awareness;Decreased knowledge of use of DME or AE;Decreased knowledge of precautions;Pain;Impaired UE functional use      OT Treatment/Interventions: Self-care/ADL training;Therapeutic exercise;Neuromuscular education;DME and/or AE instruction;Therapeutic activities;Patient/family education;Balance training;Cognitive remediation/compensation    OT Goals(Current goals can be found in the care plan section) Acute Rehab OT Goals Patient Stated Goal: unable to state OT Goal Formulation: Patient unable to participate in goal setting Time For Goal Achievement: 09/30/21 Potential to Achieve Goals: Fair  OT Frequency: Min 2X/week   Barriers to D/C:            Co-evaluation              AM-PAC OT "6 Clicks" Daily Activity     Outcome Measure Help from another person eating meals?: Total Help from another person taking care of personal grooming?: A Lot Help from another person toileting, which includes using toliet, bedpan, or urinal?: Total Help from another person bathing (including washing, rinsing, drying)?: Total Help from another person to put on and taking off regular upper body clothing?: A Lot Help from another person to put on and taking off regular lower body clothing?: Total 6 Click Score: 8   End of Session Nurse Communication: Mobility status  Activity Tolerance: Other (comment) (Limited  by cognition) Patient left: in bed;with call bell/phone within reach;with bed alarm set  OT Visit Diagnosis: Muscle weakness (generalized) (M62.81);Other symptoms and signs involving cognitive function;Pain                Time: 8978-4784 OT Time Calculation (min): 20 min Charges:  OT General Charges $OT Visit: 1 Visit OT Evaluation $OT Eval Moderate Complexity: 1 Mod  Francia Verry C, OT/L  Acute Rehab [place] 09/16/2021, 1:41 PM

## 2021-09-16 NOTE — Progress Notes (Signed)
This patient have very limited vascular access. After bilateral assessment of vasculature, this nurse called and spoke with Israa RN regarding infusions and need for PIV. She reported she would contact MD. I notified Israa, that midline would be an option if MD wants to continue infusions. Notified that consult would be completed and to reconsult if IV is still needed. VU. Fran Lowes, RN VAST

## 2021-09-17 DIAGNOSIS — F419 Anxiety disorder, unspecified: Secondary | ICD-10-CM | POA: Diagnosis not present

## 2021-09-17 DIAGNOSIS — I5032 Chronic diastolic (congestive) heart failure: Secondary | ICD-10-CM | POA: Diagnosis not present

## 2021-09-17 DIAGNOSIS — R5381 Other malaise: Secondary | ICD-10-CM | POA: Diagnosis not present

## 2021-09-17 DIAGNOSIS — I10 Essential (primary) hypertension: Secondary | ICD-10-CM | POA: Diagnosis not present

## 2021-09-17 LAB — GLUCOSE, CAPILLARY
Glucose-Capillary: 112 mg/dL — ABNORMAL HIGH (ref 70–99)
Glucose-Capillary: 117 mg/dL — ABNORMAL HIGH (ref 70–99)
Glucose-Capillary: 119 mg/dL — ABNORMAL HIGH (ref 70–99)
Glucose-Capillary: 76 mg/dL (ref 70–99)
Glucose-Capillary: 76 mg/dL (ref 70–99)
Glucose-Capillary: 94 mg/dL (ref 70–99)

## 2021-09-17 MED ORDER — DICLOFENAC SODIUM 1 % EX GEL
4.0000 g | Freq: Four times a day (QID) | CUTANEOUS | Status: DC
  Administered 2021-09-17 – 2021-09-30 (×39): 4 g via TOPICAL
  Filled 2021-09-17: qty 100

## 2021-09-17 MED ORDER — LIDOCAINE 5 % EX PTCH
1.0000 | MEDICATED_PATCH | CUTANEOUS | Status: DC
  Administered 2021-09-17 – 2021-09-30 (×11): 1 via TRANSDERMAL
  Filled 2021-09-17 (×14): qty 1

## 2021-09-17 NOTE — Progress Notes (Signed)
Physical Therapy Treatment Patient Details Name: Sherry Baker MRN: [id number] DOB: [birth year] Today's Date: 09/17/2021   History of Present Illness Pt is a 55 y/o female brought to ED after found to be minimally responsive behind a dumpster surrounded by beer cans.  She was hypotensive with tachycardia, agitation and intermittent somnolence.  She was intubated for airway protection and admitted to ICU for complicated EtOH withdrawal.  Initially treated with Precedex which was discontinued due to bradycardia.  She was a started on fentanyl drip, Librium and Ativan as needed.  She also received empiric antibiotics from 11/4-11/8 with Vanco and Unasyn then on Vanco and CTX presumably for respiratory infection. Eventually extubated on 11/10, and transferred to [facility] service on 11/12.  She is on TF via cortrak for dysphagia. PMH including but not limited to EtOH abuse, complicated EtOH withdrawal requiring intubation, cocaine use, tobacco use disorder, CKD-3, HTN, depression, bipolar disorder, vitamin D deficiency, vitamin B12 deficiency, rectal cancer s/p resection, chemo and ostomy.    PT Comments    Pt admitted with above diagnosis. Pt was able to stand to her feet to be cleaned and transferred to a 3N1 with +2 mod assist. Pt still intermittently confused and requires max cues at times for safety but is progressing.  Pt currently with functional limitations due to balance and endurance deficits. Pt will benefit from skilled PT to increase their independence and safety with mobility to allow discharge to the venue listed below.      Recommendations for follow up therapy are one component of a multi-disciplinary discharge planning process, led by the attending physician.  Recommendations may be updated based on patient status, additional functional criteria and insurance authorization.  Follow Up Recommendations  Skilled nursing-short term rehab (<3 hours/day)     Assistance Recommended at Discharge  Frequent or constant Supervision/Assistance  Equipment Recommendations  Other (comment) (defer to next venue of care)    Recommendations for Other Services       Precautions / Restrictions Precautions Precautions: Fall Precaution Comments: NGT, colostomy Restrictions Weight Bearing Restrictions: No     Mobility  Bed Mobility Overal bed mobility: Needs Assistance Bed Mobility: Supine to Sit;Sit to Supine     Supine to sit: Min assist;HOB elevated Sit to supine: Min assist;HOB elevated   General bed mobility comments: increased time and effort, cueing needed for sequencing, min assist with bilateral LE movement onto and off of bed as well as for trunk management    Transfers Overall transfer level: Needs assistance Equipment used: 2 person hand held assist Transfers: Sit to/from Stand;Bed to chair/wheelchair/BSC Sit to Stand: Mod assist;+2 physical assistance;From elevated surface Stand pivot transfers: Mod assist;+2 physical assistance;From elevated surface         General transfer comment: Pts bed very wet on arrival.  Pt stood to her feet with bil UE support. Maintained narrow BOS and flexed at trunk somewhat. Cleaned pts buttocks in standing. Pt stated she needed to use the bathroom therefore obtained 3N1 and transferred to 3N1.  she did not urinate in potty.  With second stand, placed Mepiplex dressing on pts buttocks as the old dressing was rolled up.  Pt able to transfer back to bed with +2 mod assist taking a few steps to the bed.  Needed max cues for safety and 2 persons.    Ambulation/Gait                   Stairs  Wheelchair Mobility    Modified Rankin (Stroke Patients Only)       Balance Overall balance assessment: Needs assistance Sitting-balance support: Bilateral upper extremity supported;Feet supported;No upper extremity supported Sitting balance-Leahy Scale: Poor Sitting balance - Comments: pt requiring min guard assist  to sit in an upright sitting position for 20 minutes   Standing balance support: Bilateral upper extremity supported;During functional activity Standing balance-Leahy Scale: Poor Standing balance comment: Relieson 2 person assist                            Cognition Arousal/Alertness: Awake/alert Behavior During Therapy: Flat affect Overall Cognitive Status: Difficult to assess                                 General Comments: Exhibits limited ability to follow basic one-step commands.  Pt thought she was at home.  Pt confused intermittently during session. Pt asked for a cigarette at end of session as well as told staff, "Love you baby." "And send a man in next time."        Exercises      General Comments        Pertinent Vitals/Pain Pain Assessment: Faces Faces Pain Scale: Hurts little more Facial Expression: Grimacing Body Movements: Restlessness Muscle Tension: Tense, rigid Compliance with ventilator (intubated pts.): N/A Vocalization (extubated pts.): Talking in normal tone or no sound CPOT Total: 5 Pain Location: generalized Pain Descriptors / Indicators: Grimacing;Restless Pain Intervention(s): Limited activity within patient's tolerance;Monitored during session;Repositioned    Home Living                          Prior Function            PT Goals (current goals can now be found in the care plan section) Acute Rehab PT Goals Patient Stated Goal: unable to state Progress towards PT goals: Progressing toward goals    Frequency    Min 2X/week      PT Plan Current plan remains appropriate    Co-evaluation              AM-PAC PT "6 Clicks" Mobility   Outcome Measure  Help needed turning from your back to your side while in a flat bed without using bedrails?: A Lot Help needed moving from lying on your back to sitting on the side of a flat bed without using bedrails?: A Lot Help needed moving to and from a  bed to a chair (including a wheelchair)?: Total Help needed standing up from a chair using your arms (e.g., wheelchair or bedside chair)?: Total Help needed to walk in hospital room?: Total Help needed climbing 3-5 steps with a railing? : Total 6 Click Score: 8    End of Session Equipment Utilized During Treatment: Gait belt Activity Tolerance: Patient limited by fatigue;Patient limited by pain Patient left: in bed;with call bell/phone within reach;with bed alarm set;with restraints reapplied Nurse Communication: Mobility status PT Visit Diagnosis: Other abnormalities of gait and mobility (R26.89)     Time: 9373-4287 PT Time Calculation (min) (ACUTE ONLY): 25 min  Charges:  $Therapeutic Activity: 23-37 mins                     Kirra Verga M,PT Acute Rehab Services [phone] (pager)    Alvira Philips 09/17/2021, 11:45 AM

## 2021-09-17 NOTE — Progress Notes (Addendum)
PROGRESS NOTE  Sherry Baker WPY:[id number] DOB: 09-12-1966   PCP: Default, Provider, MD  Patient is from: Home  DOA: 09/05/2021 LOS: 12  Chief complaints:  No chief complaint on file.    Brief Narrative / Interim history: 55 year old F with PMH of EtOH abuse, complicated EtOH withdrawal requiring intubation, cocaine use, tobacco use disorder, CKD-3, HTN, depression, bipolar disorder, vitamin D deficiency, vitamin B12 deficiency, rectal cancer s/p resection, chemo and ostomy brought to ED after found to be minimally responsive behind a dumpster surrounded by beer cans.  She was hypotensive with tachycardia, agitation and intermittent somnolence.  She was intubated for airway protection and admitted to ICU for complicated EtOH withdrawal.  Initially treated with Precedex which was discontinued due to bradycardia.  She was a started on fentanyl drip, Librium and Ativan as needed.  She also received empiric antibiotics from 11/4-11/8 with Vanco and Unasyn then on Vanco and CTX presumably for respiratory infection. Eventually extubated on 11/10, and transferred to [facility] service on 11/12.  Encephalopathy improving but continues to require abdominal belt for safety.  Remains on TF via cortrak for dysphagia.  Final disposition likely home.  Doubt she can be placed at SNF with cocaine and alcohol use disorder.  Subjective: Seen and examined earlier this morning.  No major events overnight of this morning.  Reports back and neck pain from lying in bed.  She says she is wet from urine.  Weekly is not connected.  She denies chest pain, dyspnea, nausea, vomiting or abdominal pain.  Objective: Vitals:   09/16/21 0812 09/16/21 1705 09/16/21 2138 09/17/21 0757  BP: (!) 171/101 138/80 129/67 (!) 155/97  Pulse: 83 84 82 77  Resp: 20 16 17 16   Temp: 98.5 F (36.9 C) 98.4 F (36.9 C) 98.1 F (36.7 C) 98 F (36.7 C)  TempSrc: Oral Oral Oral Oral  SpO2: 100% 98% 96% 94%  Weight:      Height:         Intake/Output Summary (Last 24 hours) at 09/17/2021 1455 Last data filed at 09/17/2021 1409 Gross per 24 hour  Intake 1052 ml  Output 1 ml  Net 1051 ml   Filed Weights   09/14/21 0407 09/15/21 0418 09/16/21 0500  Weight: 48.8 kg 49.9 kg 47.6 kg    Examination:  GENERAL: Frail looking elderly female.  No apparent distress. HEENT: MMM.  Vision and hearing grossly intact.  NECK: Supple.  No apparent JVD.  RESP: 94% on RA.  No IWOB.  Fair aeration bilaterally. CVS:  RRR. Heart sounds normal.  ABD/GI/GU: BS+. Abd soft, NTND.  Safety belt across abdomen.  LLQ ostomy with stool. MSK/EXT:  Moves extremities. No apparent deformity. No edema.  SKIN: no apparent skin lesion or wound NEURO: Awake and alert.  Oriented to self, place, year, person and situation but not date and month.  No apparent focal neuro deficit. PSYCH: Calm. Normal affect.   Procedures:  11/3-11/10-intubation and mechanical ventilation.  Microbiology summarized: 11/3-COVID-19, influenza PCR and full RVP negative 11/3-MRSA PCR positive 11/4-respiratory culture with moderate Streptococcus pneumonia sensitive to CTX 11/4-blood cultures negative.  Assessment & Plan: Acute metabolic encephalopathy-likely related to alcohol.  CTH, ammonia, TSH, B12 and RPR unrevealing. No focal neuro deficit to suggest CVA but limited exam.  Encephalopathy improving.  Oriented x4 except date of months. -Started high-dose thiamine on 11/12-continue -Aspiration precaution. -Continue abdominal belt for safety. -Continue Seroquel with as needed Haldol -Passed voiding trial on 11/13.  Complicated alcohol withdrawal requiring intubation and  mechanical ventilation Cocaine use disorder -Completed Librium taper. -Continue multivitamins, folic acid and thiamine. -Discontinued CIWA monitoring. -TOC consulted for counseling or resources when she wakes up  Dysphagia: Likely from intubation and encephalopathy.  SLP recommended n.p.o. -On  TF via Cortrak -SLP following.  Abnormal EKG with ST changes Mildly elevated troponin, 24> 54>> 66-likely demand ischemia. Chronic diastolic CHF-TTE on 50/5 with LVEF of 55 to 60%, G1 DD, no RWMA Uncontrolled hypertension: Normotensive. -Added Coreg 6.25 mg twice daily on 11/12. -Continue amlodipine 10 mg daily -Add p.o. hydralazine as needed   Tobacco use disorder -Encourage cessation when able to comprehend -Nicotine patch   AKI on CKD-3A/azotemia: AKI resolved. Recent Labs    09/05/21 1506 09/06/21 0212 09/07/21 0203 09/08/21 0313 09/09/21 3976 09/10/21 0430 09/11/21 0228 09/12/21 0223 09/14/21 0818 09/15/21 0600  BUN 23* 21* 18 15 15 17 19 18 14  24*  CREATININE 1.71* 1.40* 1.32* 1.27* 1.48* 1.39* 1.55* 1.28* 1.35* 1.35*  -Continue monitoring  History of vitamin B12 deficiency-B12-473 now History of vitamin D deficiency-vitamin D slightly low at 21.16. -Vitamin D 800 units daily  History of rectal cancer s/p resection w/ostomy -Routine ostomy care   GERD -Continue Protonix   Anxiety/depression/bipolar 1 disorder: Not able to assess this given her mental status. -Continue BuSpar, Celexa and Seroquel   RUE swelling/superficial thrombophlebitis -Axillary line was discontinued  Physical deconditioning due to acute illness and chronic alcohol abuse -PT/OT-recommends SNF.  Neck/back pain -Continue Tylenol as needed -Add Voltaren gel  Severe malnutrition: as evidenced by low BMI and significant muscle mass and subcu fat loss. Body mass index is 19.19 kg/m. Nutrition Problem: Severe Malnutrition Etiology: chronic illness (cirrhosis, polysubstance abuse) Signs/Symptoms: severe muscle depletion, severe fat depletion Interventions: Tube feeding, Prostat, MVI  Pressure skin injury: POA Pressure Injury 06/23/21 Coccyx Lower;Medial Unstageable - Full thickness tissue loss in which the base of the injury is covered by slough (yellow, tan, gray, green or brown)  and/or eschar (tan, brown or black) in the wound bed. (Active)  06/23/21 0100  Location: Coccyx  Location Orientation: Lower;Medial  Staging: Unstageable - Full thickness tissue loss in which the base of the injury is covered by slough (yellow, tan, gray, green or brown) and/or eschar (tan, brown or black) in the wound bed.  Wound Description (Comments):   Present on Admission: Yes     Pressure Injury 09/05/21 Coccyx Mid Stage 3 -  Full thickness tissue loss. Subcutaneous fat may be visible but bone, tendon or muscle are NOT exposed. dehissed surgical wound from 2018; NOT pressure (Active)  09/05/21 2300  Location: Coccyx  Location Orientation: Mid  Staging: Stage 3 -  Full thickness tissue loss. Subcutaneous fat may be visible but bone, tendon or muscle are NOT exposed.  Wound Description (Comments): dehissed surgical wound from 2018; NOT pressure  Present on Admission: Yes   DVT prophylaxis:  heparin injection 5,000 Units Start: 09/06/21 0600 SCDs Start: 09/05/21 2126  Code Status: Full code Family Communication: Updated [place], who identifies herself as a cousin at bedside on 11/14.  None at bedside today  Level of care: Med-Surg Status is: Inpatient  Remains inpatient appropriate because: Encephalopathy soft abdominal restraints and dysphagia requiring tube feed.   Final disposition: Therapy recommends SNF but difficult to place with cocaine and alcohol use.  Likely home when medically stable.    Consultants:  Pulmonology   Sch Meds:  Scheduled Meds:  amLODipine  10 mg Per Tube Daily   bethanechol  5 mg Per Tube  TID   busPIRone  15 mg Per Tube BID   carvedilol  6.25 mg Per Tube BID WC   Chlorhexidine Gluconate Cloth  6 each Topical Daily   cholecalciferol  800 Units Oral Daily   citalopram  10 mg Per Tube Daily   docusate  100 mg Per Tube BID   feeding supplement (PROSource TF)  45 mL Per Tube BID   folic acid  1 mg Per Tube Daily   heparin  5,000 Units  Subcutaneous Q8H   losartan  25 mg Per Tube Daily   mouth rinse  15 mL Mouth Rinse BID   multivitamin with minerals  1 tablet Per Tube Daily   nicotine  14 mg Transdermal Daily   pantoprazole sodium  40 mg Per Tube QHS   polyethylene glycol  17 g Per Tube Daily   QUEtiapine  25 mg Per Tube BID   sodium chloride flush  10-40 mL Intracatheter Q12H   [START ON 09/21/2021] thiamine  100 mg Oral Daily   Continuous Infusions:  feeding supplement (OSMOLITE 1.5 CAL) 1,000 mL (09/17/21 1000)   thiamine injection 250 mg (09/17/21 1024)   PRN Meds:.acetaminophen (TYLENOL) oral liquid 160 mg/5 mL, haloperidol lactate, labetalol, sodium chloride flush  Antimicrobials: Anti-infectives (From admission, onward)    Start     Dose/Rate Route Frequency Ordered Stop   09/08/21 0830  cefTRIAXone (ROCEPHIN) 2 g in sodium chloride 0.9 % 100 mL IVPB       Note to Pharmacy: Tolerated unasyn   2 g 200 mL/hr over 30 Minutes Intravenous Every 24 hours 09/08/21 0731 09/10/21 0832   09/07/21 1000  vancomycin (VANCOREADY) IVPB 500 mg/100 mL  Status:  Discontinued        500 mg 100 mL/hr over 60 Minutes Intravenous Every 24 hours 09/06/21 1452 09/07/21 1037   09/06/21 1633  Ampicillin-Sulbactam (UNASYN) 3 g in sodium chloride 0.9 % 100 mL IVPB  Status:  Discontinued        3 g 200 mL/hr over 30 Minutes Intravenous Every 8 hours 09/06/21 1258 09/07/21 1037   09/06/21 1545  vancomycin (VANCOREADY) IVPB 1000 mg/200 mL        1,000 mg 200 mL/hr over 60 Minutes Intravenous  Once 09/06/21 1452 09/06/21 1629   09/06/21 0915  Ampicillin-Sulbactam (UNASYN) 3 g in sodium chloride 0.9 % 100 mL IVPB  Status:  Discontinued        3 g 200 mL/hr over 30 Minutes Intravenous Every 12 hours 09/06/21 0817 09/06/21 1258        I have personally reviewed the following labs and images: CBC: Recent Labs  Lab 09/11/21 0228 09/11/21 0249 09/11/21 0910 09/12/21 0223 09/14/21 0818 09/15/21 0600  WBC 3.9*  --   --  3.7* 5.2  6.2  NEUTROABS 2.6  --   --   --  3.3  --   HGB 8.3* 8.8* 8.2* 8.9* 11.1* 11.4*  HCT 27.3* 26.0* 24.0* 29.1* 35.5* 35.4*  MCV 94.1  --   --  92.7 92.9 89.4  PLT 177  --   --  197 280 358   BMP &GFR Recent Labs  Lab 09/11/21 0228 09/11/21 0249 09/11/21 0910 09/12/21 0223 09/13/21 1710 09/14/21 0459 09/14/21 0818 09/14/21 1609 09/15/21 0600 09/15/21 1917 09/16/21 0641  NA 137 140 140 137  --   --  136  --  137  --   --   K 4.2 4.3 4.0 4.2  --   --  4.5  --  4.4  --   --   CL 101  --   --  106  --   --  103  --  107  --   --   CO2 28  --   --  24  --   --  21*  --  22  --   --   GLUCOSE 126*  --   --  96  --   --  113*  --  115*  --   --   BUN 19  --   --  18  --   --  14  --  24*  --   --   CREATININE 1.55*  --   --  1.28*  --   --  1.35*  --  1.35*  --   --   CALCIUM 8.7*  --   --  8.7*  --   --  9.0  --  9.1  --   --   MG 2.5*  --   --   --    < > 2.1  --  2.2 2.4 2.4 2.4  PHOS 6.0*  --   --   --    < > 4.6  --  4.7* 4.2  4.2 4.4 4.7*   < > = values in this interval not displayed.   Estimated Creatinine Clearance: 36.2 mL/min (A) (by C-G formula based on SCr of 1.35 mg/dL (H)). Liver & Pancreas: Recent Labs  Lab 09/14/21 0818 09/15/21 0600  AST 35  --   ALT 20  --   ALKPHOS 55  --   BILITOT 0.5  --   PROT 6.7  --   ALBUMIN 2.3* 2.4*   No results for input(s): LIPASE, AMYLASE in the last 168 hours. Recent Labs  Lab 09/14/21 1053  AMMONIA <10   Diabetic: No results for input(s): HGBA1C in the last 72 hours. Recent Labs  Lab 09/16/21 1206 09/16/21 2121 09/17/21 0043 09/17/21 0754 09/17/21 1212  GLUCAP 93 96 119* 117* 94   Cardiac Enzymes: Recent Labs  Lab 09/15/21 0600  CKTOTAL 23*   No results for input(s): PROBNP in the last 8760 hours. Coagulation Profile: No results for input(s): INR, PROTIME in the last 168 hours. Thyroid Function Tests: No results for input(s): TSH, T4TOTAL, FREET4, T3FREE, THYROIDAB in the last 72 hours.  Lipid  Profile: No results for input(s): CHOL, HDL, LDLCALC, TRIG, CHOLHDL, LDLDIRECT in the last 72 hours. Anemia Panel: Recent Labs    09/15/21 0600  VITAMINB12 473  FOLATE 16.1  FERRITIN 80  TIBC 374  IRON 57  RETICCTPCT 2.8   Urine analysis:    Component Value Date/Time   COLORURINE STRAW (A) 09/05/2021 1638   APPEARANCEUR CLEAR 09/05/2021 1638   LABSPEC 1.005 09/05/2021 1638   PHURINE 6.0 09/05/2021 1638   GLUCOSEU NEGATIVE 09/05/2021 1638   HGBUR SMALL (A) 09/05/2021 1638   BILIRUBINUR NEGATIVE 09/05/2021 1638   KETONESUR NEGATIVE 09/05/2021 1638   PROTEINUR 100 (A) 09/05/2021 1638   UROBILINOGEN 0.2 12/23/2017 1417   NITRITE NEGATIVE 09/05/2021 1638   LEUKOCYTESUR NEGATIVE 09/05/2021 1638   Sepsis Labs: Invalid input(s): PROCALCITONIN, [place]  Microbiology: No results found for this or any previous visit (from the past 240 hour(s)).   Radiology Studies: No results found.   Sherrice Creekmore T. Graham  If 7PM-7AM, please contact night-coverage [email or web address] 09/17/2021, 2:55 PM

## 2021-09-18 DIAGNOSIS — I10 Essential (primary) hypertension: Secondary | ICD-10-CM | POA: Diagnosis not present

## 2021-09-18 DIAGNOSIS — R5381 Other malaise: Secondary | ICD-10-CM | POA: Diagnosis not present

## 2021-09-18 DIAGNOSIS — F419 Anxiety disorder, unspecified: Secondary | ICD-10-CM | POA: Diagnosis not present

## 2021-09-18 DIAGNOSIS — I5032 Chronic diastolic (congestive) heart failure: Secondary | ICD-10-CM | POA: Diagnosis not present

## 2021-09-18 LAB — CBC
HCT: 33.2 % — ABNORMAL LOW (ref 36.0–46.0)
Hemoglobin: 10.5 g/dL — ABNORMAL LOW (ref 12.0–15.0)
MCH: 28.5 pg (ref 26.0–34.0)
MCHC: 31.6 g/dL (ref 30.0–36.0)
MCV: 90.2 fL (ref 80.0–100.0)
Platelets: 396 10*3/uL (ref 150–400)
RBC: 3.68 MIL/uL — ABNORMAL LOW (ref 3.87–5.11)
RDW: 17 % — ABNORMAL HIGH (ref 11.5–15.5)
WBC: 6.8 10*3/uL (ref 4.0–10.5)
nRBC: 0 % (ref 0.0–0.2)

## 2021-09-18 LAB — RENAL FUNCTION PANEL
Albumin: 2.7 g/dL — ABNORMAL LOW (ref 3.5–5.0)
Anion gap: 11 (ref 5–15)
BUN: 49 mg/dL — ABNORMAL HIGH (ref 6–20)
CO2: 20 mmol/L — ABNORMAL LOW (ref 22–32)
Calcium: 9.4 mg/dL (ref 8.9–10.3)
Chloride: 105 mmol/L (ref 98–111)
Creatinine, Ser: 1.48 mg/dL — ABNORMAL HIGH (ref 0.44–1.00)
GFR, Estimated: 42 mL/min — ABNORMAL LOW (ref >60)
Glucose, Bld: 78 mg/dL (ref 70–99)
Phosphorus: 6.2 mg/dL — ABNORMAL HIGH (ref 2.5–4.6)
Potassium: 4.7 mmol/L (ref 3.5–5.1)
Sodium: 136 mmol/L (ref 135–145)

## 2021-09-18 LAB — GLUCOSE, CAPILLARY
Glucose-Capillary: 62 mg/dL — ABNORMAL LOW (ref 70–99)
Glucose-Capillary: 96 mg/dL (ref 70–99)
Glucose-Capillary: 100 mg/dL — ABNORMAL HIGH (ref 70–99)
Glucose-Capillary: 109 mg/dL — ABNORMAL HIGH (ref 70–99)

## 2021-09-18 LAB — MAGNESIUM: Magnesium: 2.4 mg/dL (ref 1.7–2.4)

## 2021-09-18 LAB — VITAMIN B1: Vitamin B1 (Thiamine): 198.2 nmol/L (ref 66.5–200.0)

## 2021-09-18 MED ORDER — HYDROXYZINE HCL 10 MG PO TABS
10.0000 mg | ORAL_TABLET | Freq: Three times a day (TID) | ORAL | Status: DC
  Administered 2021-09-18 – 2021-09-20 (×5): 10 mg
  Filled 2021-09-18 (×8): qty 1

## 2021-09-18 MED ORDER — LORAZEPAM 2 MG/ML IJ SOLN: 0.5000 mg | Freq: Four times a day (QID) | INTRAMUSCULAR | Status: DC | PRN

## 2021-09-18 MED ORDER — HYDROXYZINE HCL 10 MG/5ML PO SYRP
10.0000 mg | ORAL_SOLUTION | Freq: Three times a day (TID) | ORAL | Status: DC
  Administered 2021-09-18: 10 mg
  Filled 2021-09-18: qty 5

## 2021-09-18 NOTE — Consult Note (Signed)
[place] Nurse Consult Note: Patient receiving care in [place]. WOC requested to bedside to evaluate wound found by staff during PIP. Reason for Consult: wound to coccyx Wound type: stage 3 Pressure Injury POA: No Measurement: 3 cm x 2 cm x 0.8 cm Wound bed: 944% slick, pink with one tiny spot of what could be tendon in wound bed, or possibly ligament Drainage (amount, consistency, odor) none Periwound: intact Dressing procedure/placement/frequency: Place saline moistened gauze into the coccyx wound, cover with a foam dressing. May be able to use one foam dressing for the coccyx and the residual rectal area wound.  It appears that the area involved in the original surgery for rectal cancer is breaking down. Today the formerly dark pink, non-smooth area close to where the anus used to be, is smooth, slick and pale pink.   Monitor the wound area(s) for worsening of condition such as: Signs/symptoms of infection,  Increase in size,  Development of or worsening of odor, Development of pain, or increased pain at the affected locations.  Notify the medical team if any of these develop.  Thank you for the consult.  Discussed plan of care with the patient and bedside nurse.  Val Riles, RN, MSN, CWOCN, CNS-BC, pager [phone]

## 2021-09-18 NOTE — Progress Notes (Signed)
PT Cancellation Note  Patient Details Name: Sherry Baker MRN: [id number] DOB: 1966/01/23   Cancelled Treatment:    Reason Eval/Treat Not Completed: Other (comment) (per RN pt now in restraints and agitated. PT to hold this date per RN request)  Alfonse Alpers PT, DPT  09/18/2021, 2:27 PM

## 2021-09-18 NOTE — Progress Notes (Addendum)
PROGRESS NOTE  Sherry Baker KGU:[id number] DOB: Jul 16, 1966   PCP: Default, Provider, MD  Patient is from: Home  DOA: 09/05/2021 LOS: 13  Chief complaints:  No chief complaint on file.    Brief Narrative / Interim history: 55 year old F with PMH of EtOH abuse, complicated EtOH withdrawal requiring intubation, cocaine use, tobacco use disorder, CKD-3, HTN, depression, bipolar disorder, vitamin D deficiency, vitamin B12 deficiency, rectal cancer s/p resection, chemo and ostomy brought to ED after found to be minimally responsive behind a dumpster surrounded by beer cans.  She was hypotensive with tachycardia, agitation and intermittent somnolence.  She was intubated for airway protection and admitted to ICU for complicated EtOH withdrawal.  Initially treated with Precedex which was discontinued due to bradycardia.  She was a started on fentanyl drip, Librium and Ativan as needed.  She also received empiric antibiotics from 11/4-11/8 with Vanco and Unasyn then on Vanco and CTX presumably for respiratory infection. Eventually extubated on 11/10, and transferred to [facility] service on 11/12.  Encephalopathy improving but confused and continues to require as needed meds and abdominal belt for safety.  Remains on TF via cortrak for dysphagia.  Final disposition likely home.  Doubt she can be placed at SNF with cocaine and alcohol use disorder.  Subjective: Seen and examined earlier this morning.  No major events overnight or this morning.  Reports hurting all over.  Also discomfort in her nose from the cortrack.  She is oriented to self, place and year.  Intermittently confused.  Continues to require abdominal belt for safety.  Objective: Vitals:   09/16/21 2138 09/17/21 0757 09/17/21 2047 09/18/21 0405  BP: 129/67 (!) 155/97 137/75 (!) 164/88  Pulse: 82 77 69   Resp: 17 16 16    Temp: 98.1 F (36.7 C) 98 F (36.7 C) 98.9 F (37.2 C) 98.6 F (37 C)  TempSrc: Oral Oral Oral Oral  SpO2: 96% 94% 97%    Weight:    46.9 kg  Height:        Intake/Output Summary (Last 24 hours) at 09/18/2021 1040 Last data filed at 09/17/2021 2248 Gross per 24 hour  Intake 807.25 ml  Output 101 ml  Net 706.25 ml   Filed Weights   09/15/21 0418 09/16/21 0500 09/18/21 0405  Weight: 49.9 kg 47.6 kg 46.9 kg    Examination:   GENERAL: Frail looking.  No apparent distress. HEENT: MMM.  Vision and hearing grossly intact.  NECK: Supple.  No apparent JVD.  RESP:  No IWOB.  Fair aeration bilaterally. CVS:  RRR. Heart sounds normal.  ABD/GI/GU: BS+. Abd soft, NTND.  LLQ ostomy with stool. MSK/EXT:  Moves extremities.  Significant muscle mass and subcu fat loss. SKIN: no apparent skin lesion or wound NEURO: Awake and alert. Oriented to self, place and year. No apparent focal neuro deficit. PSYCH: Calm. Normal affect.   Procedures:  11/3-11/10-intubation and mechanical ventilation.  Microbiology summarized: 11/3-COVID-19, influenza PCR and full RVP negative 11/3-MRSA PCR positive 11/4-respiratory culture with moderate Streptococcus pneumonia sensitive to CTX 11/4-blood cultures negative.  Assessment & Plan: Acute metabolic encephalopathy-likely related to alcohol.  CTH, ammonia, TSH, B12 and RPR unrevealing. No focal neuro deficit to suggest CVA but limited exam.  Encephalopathy improving.  Oriented x4 except date of months. -Started high-dose thiamine on 11/12-continue -Aspiration precaution. -Continue abdominal belt for safety. -Discontinued Seroquel and Haldol given QTc -Atarax 10 mg 3 times daily with as needed Ativan for anxiety/agitation. -Check EKG -Passed voiding trial on 11/13.  Complicated  alcohol withdrawal requiring intubation and mechanical ventilation Cocaine use disorder -Completed Librium taper. -Continue multivitamins, folic acid and thiamine. -Discontinued CIWA monitoring. -TOC consulted for counseling or resources when she wakes up  Dysphagia: Likely from intubation and  encephalopathy.  SLP recommended n.p.o. -On TF via Cortrak.  May need G-tube if not able to advance diet. -SLP following.  Abnormal EKG with ST changes Mildly elevated troponin, 24> 54>> 66-likely demand ischemia. Chronic diastolic CHF-TTE on 15/7 with LVEF of 55 to 60%, G1 DD, no RWMA Uncontrolled hypertension: Normotensive. -Added Coreg 6.25 mg twice daily on 11/12. -Continue amlodipine 10 mg daily -Add p.o. hydralazine as needed   Tobacco use disorder -Encourage cessation when able to comprehend -Nicotine patch   AKI on CKD-3A/azotemia: AKI resolved. Recent Labs    09/06/21 0212 09/07/21 0203 09/08/21 0313 09/09/21 2620 09/10/21 0430 09/11/21 0228 09/12/21 0223 09/14/21 0818 09/15/21 0600 09/18/21 0128  BUN 21* 18 15 15 17 19 18 14  24* 49*  CREATININE 1.40* 1.32* 1.27* 1.48* 1.39* 1.55* 1.28* 1.35* 1.35* 1.48*  -Continue monitoring  History of vitamin B12 deficiency-B12-473 now History of vitamin D deficiency-vitamin D slightly low at 21.16. -Vitamin D 800 units daily  History of rectal cancer s/p resection w/ostomy -Routine ostomy care   GERD -Continue Protonix   Anxiety/depression/bipolar 1 disorder: Not able to assess this given her mental status. -Continue BuSpar and Celexa -Added Atarax with as needed Ativan   RUE swelling/superficial thrombophlebitis -Axillary line was discontinued  Physical deconditioning due to acute illness and chronic alcohol abuse -PT/OT-recommends SNF.  Neck/back pain -Continue Tylenol and Voltaren gel  Prolonged QT: 542 on 11/3. -Discontinued Seroquel and Haldol -Optimize K and Mg -Repeat EKG today  Severe malnutrition: as evidenced by low BMI and significant muscle mass and subcu fat loss. Body mass index is 18.91 kg/m. Nutrition Problem: Severe Malnutrition Etiology: chronic illness (cirrhosis, polysubstance abuse) Signs/Symptoms: severe muscle depletion, severe fat depletion Interventions: Tube feeding, Prostat,  MVI  Pressure skin injury: POA Pressure Injury 06/23/21 Coccyx Lower;Medial Unstageable - Full thickness tissue loss in which the base of the injury is covered by slough (yellow, tan, gray, green or brown) and/or eschar (tan, brown or black) in the wound bed. (Active)  06/23/21 0100  Location: Coccyx  Location Orientation: Lower;Medial  Staging: Unstageable - Full thickness tissue loss in which the base of the injury is covered by slough (yellow, tan, gray, green or brown) and/or eschar (tan, brown or black) in the wound bed.  Wound Description (Comments):   Present on Admission: Yes     Pressure Injury 09/05/21 Coccyx Mid Stage 3 -  Full thickness tissue loss. Subcutaneous fat may be visible but bone, tendon or muscle are NOT exposed. dehissed surgical wound from 2018; NOT pressure (Active)  09/05/21 2300  Location: Coccyx  Location Orientation: Mid  Staging: Stage 3 -  Full thickness tissue loss. Subcutaneous fat may be visible but bone, tendon or muscle are NOT exposed.  Wound Description (Comments): dehissed surgical wound from 2018; NOT pressure  Present on Admission: Yes   DVT prophylaxis:  heparin injection 5,000 Units Start: 09/06/21 0600 SCDs Start: 09/05/21 2126  Code Status: Full code Family Communication: None at bedside today.   Level of care: Med-Surg Status is: Inpatient  Remains inpatient appropriate because: Encephalopathy requiring soft restraints and dysphagia requiring tube feed.   Final disposition: Therapy recommends SNF but difficult to place with cocaine and alcohol use.  Likely home when medically stable.    Consultants:  Pulmonology  Sch Meds:  Scheduled Meds:  amLODipine  10 mg Per Tube Daily   bethanechol  5 mg Per Tube TID   busPIRone  15 mg Per Tube BID   carvedilol  6.25 mg Per Tube BID WC   Chlorhexidine Gluconate Cloth  6 each Topical Daily   cholecalciferol  800 Units Oral Daily   citalopram  10 mg Per Tube Daily   diclofenac Sodium   4 g Topical QID   docusate  100 mg Per Tube BID   feeding supplement (PROSource TF)  45 mL Per Tube BID   folic acid  1 mg Per Tube Daily   heparin  5,000 Units Subcutaneous Q8H   lidocaine  1 patch Transdermal Q24H   losartan  25 mg Per Tube Daily   mouth rinse  15 mL Mouth Rinse BID   multivitamin with minerals  1 tablet Per Tube Daily   nicotine  14 mg Transdermal Daily   pantoprazole sodium  40 mg Per Tube QHS   polyethylene glycol  17 g Per Tube Daily   QUEtiapine  25 mg Per Tube BID   sodium chloride flush  10-40 mL Intracatheter Q12H   [START ON 09/21/2021] thiamine  100 mg Oral Daily   Continuous Infusions:  feeding supplement (OSMOLITE 1.5 CAL) 1,000 mL (09/17/21 1000)   thiamine injection 250 mg (09/17/21 1024)   PRN Meds:.acetaminophen (TYLENOL) oral liquid 160 mg/5 mL, haloperidol lactate, labetalol, sodium chloride flush  Antimicrobials: Anti-infectives (From admission, onward)    Start     Dose/Rate Route Frequency Ordered Stop   09/08/21 0830  cefTRIAXone (ROCEPHIN) 2 g in sodium chloride 0.9 % 100 mL IVPB       Note to Pharmacy: Tolerated unasyn   2 g 200 mL/hr over 30 Minutes Intravenous Every 24 hours 09/08/21 0731 09/10/21 0832   09/07/21 1000  vancomycin (VANCOREADY) IVPB 500 mg/100 mL  Status:  Discontinued        500 mg 100 mL/hr over 60 Minutes Intravenous Every 24 hours 09/06/21 1452 09/07/21 1037   09/06/21 1633  Ampicillin-Sulbactam (UNASYN) 3 g in sodium chloride 0.9 % 100 mL IVPB  Status:  Discontinued        3 g 200 mL/hr over 30 Minutes Intravenous Every 8 hours 09/06/21 1258 09/07/21 1037   09/06/21 1545  vancomycin (VANCOREADY) IVPB 1000 mg/200 mL        1,000 mg 200 mL/hr over 60 Minutes Intravenous  Once 09/06/21 1452 09/06/21 1629   09/06/21 0915  Ampicillin-Sulbactam (UNASYN) 3 g in sodium chloride 0.9 % 100 mL IVPB  Status:  Discontinued        3 g 200 mL/hr over 30 Minutes Intravenous Every 12 hours 09/06/21 0817 09/06/21 1258         I have personally reviewed the following labs and images: CBC: Recent Labs  Lab 09/12/21 0223 09/14/21 0818 09/15/21 0600 09/18/21 0128  WBC 3.7* 5.2 6.2 6.8  NEUTROABS  --  3.3  --   --   HGB 8.9* 11.1* 11.4* 10.5*  HCT 29.1* 35.5* 35.4* 33.2*  MCV 92.7 92.9 89.4 90.2  PLT 197 280 358 396   BMP &GFR Recent Labs  Lab 09/12/21 0223 09/13/21 1710 09/14/21 0818 09/14/21 1609 09/15/21 0600 09/15/21 1917 09/16/21 0641 09/18/21 0128  NA 137  --  136  --  137  --   --  136  K 4.2  --  4.5  --  4.4  --   --  4.7  CL 106  --  103  --  107  --   --  105  CO2 24  --  21*  --  22  --   --  20*  GLUCOSE 96  --  113*  --  115*  --   --  78  BUN 18  --  14  --  24*  --   --  49*  CREATININE 1.28*  --  1.35*  --  1.35*  --   --  1.48*  CALCIUM 8.7*  --  9.0  --  9.1  --   --  9.4  MG  --    < >  --  2.2 2.4 2.4 2.4 2.4  PHOS  --    < >  --  4.7* 4.2  4.2 4.4 4.7* 6.2*   < > = values in this interval not displayed.   Estimated Creatinine Clearance: 32.5 mL/min (A) (by C-G formula based on SCr of 1.48 mg/dL (H)). Liver & Pancreas: Recent Labs  Lab 09/14/21 0818 09/15/21 0600 09/18/21 0128  AST 35  --   --   ALT 20  --   --   ALKPHOS 55  --   --   BILITOT 0.5  --   --   PROT 6.7  --   --   ALBUMIN 2.3* 2.4* 2.7*   No results for input(s): LIPASE, AMYLASE in the last 168 hours. Recent Labs  Lab 09/14/21 1053  AMMONIA <10   Diabetic: No results for input(s): HGBA1C in the last 72 hours. Recent Labs  Lab 09/17/21 1212 09/17/21 1840 09/17/21 2045 09/17/21 2340 09/18/21 0359  GLUCAP 94 76 112* 76 62*   Cardiac Enzymes: Recent Labs  Lab 09/15/21 0600  CKTOTAL 23*   No results for input(s): PROBNP in the last 8760 hours. Coagulation Profile: No results for input(s): INR, PROTIME in the last 168 hours. Thyroid Function Tests: No results for input(s): TSH, T4TOTAL, FREET4, T3FREE, THYROIDAB in the last 72 hours.  Lipid Profile: No results for input(s):  CHOL, HDL, LDLCALC, TRIG, CHOLHDL, LDLDIRECT in the last 72 hours. Anemia Panel: No results for input(s): VITAMINB12, FOLATE, FERRITIN, TIBC, IRON, RETICCTPCT in the last 72 hours.  Urine analysis:    Component Value Date/Time   COLORURINE STRAW (A) 09/05/2021 1638   APPEARANCEUR CLEAR 09/05/2021 1638   LABSPEC 1.005 09/05/2021 1638   PHURINE 6.0 09/05/2021 1638   GLUCOSEU NEGATIVE 09/05/2021 1638   HGBUR SMALL (A) 09/05/2021 1638   BILIRUBINUR NEGATIVE 09/05/2021 1638   KETONESUR NEGATIVE 09/05/2021 1638   PROTEINUR 100 (A) 09/05/2021 1638   UROBILINOGEN 0.2 12/23/2017 1417   NITRITE NEGATIVE 09/05/2021 1638   LEUKOCYTESUR NEGATIVE 09/05/2021 1638   Sepsis Labs: Invalid input(s): PROCALCITONIN, [place]  Microbiology: No results found for this or any previous visit (from the past 240 hour(s)).   Radiology Studies: No results found.   Rakesh Dutko T. Saxon  If 7PM-7AM, please contact night-coverage [email or web address] 09/18/2021, 10:40 AM

## 2021-09-19 DIAGNOSIS — I10 Essential (primary) hypertension: Secondary | ICD-10-CM | POA: Diagnosis not present

## 2021-09-19 DIAGNOSIS — I5032 Chronic diastolic (congestive) heart failure: Secondary | ICD-10-CM | POA: Diagnosis not present

## 2021-09-19 DIAGNOSIS — R9431 Abnormal electrocardiogram [ECG] [EKG]: Secondary | ICD-10-CM | POA: Diagnosis not present

## 2021-09-19 DIAGNOSIS — R5381 Other malaise: Secondary | ICD-10-CM | POA: Diagnosis not present

## 2021-09-19 LAB — COMPREHENSIVE METABOLIC PANEL
ALT: 62 U/L — ABNORMAL HIGH (ref 0–44)
AST: 50 U/L — ABNORMAL HIGH (ref 15–41)
Albumin: 2.8 g/dL — ABNORMAL LOW (ref 3.5–5.0)
Alkaline Phosphatase: 75 U/L (ref 38–126)
Anion gap: 7 (ref 5–15)
BUN: 58 mg/dL — ABNORMAL HIGH (ref 6–20)
CO2: 23 mmol/L (ref 22–32)
Calcium: 9.3 mg/dL (ref 8.9–10.3)
Chloride: 103 mmol/L (ref 98–111)
Creatinine, Ser: 1.61 mg/dL — ABNORMAL HIGH (ref 0.44–1.00)
GFR, Estimated: 38 mL/min — ABNORMAL LOW (ref >60)
Glucose, Bld: 109 mg/dL — ABNORMAL HIGH (ref 70–99)
Potassium: 5.1 mmol/L (ref 3.5–5.1)
Sodium: 133 mmol/L — ABNORMAL LOW (ref 135–145)
Total Bilirubin: 0.4 mg/dL (ref 0.3–1.2)
Total Protein: 7.8 g/dL (ref 6.5–8.1)

## 2021-09-19 LAB — CBC
HCT: 32.7 % — ABNORMAL LOW (ref 36.0–46.0)
Hemoglobin: 10.2 g/dL — ABNORMAL LOW (ref 12.0–15.0)
MCH: 28.3 pg (ref 26.0–34.0)
MCHC: 31.2 g/dL (ref 30.0–36.0)
MCV: 90.8 fL (ref 80.0–100.0)
Platelets: 396 10*3/uL (ref 150–400)
RBC: 3.6 MIL/uL — ABNORMAL LOW (ref 3.87–5.11)
RDW: 16.8 % — ABNORMAL HIGH (ref 11.5–15.5)
WBC: 6.2 10*3/uL (ref 4.0–10.5)
nRBC: 0 % (ref 0.0–0.2)

## 2021-09-19 LAB — MAGNESIUM: Magnesium: 2.6 mg/dL — ABNORMAL HIGH (ref 1.7–2.4)

## 2021-09-19 LAB — PHOSPHORUS: Phosphorus: 6 mg/dL — ABNORMAL HIGH (ref 2.5–4.6)

## 2021-09-19 LAB — GLUCOSE, CAPILLARY
Glucose-Capillary: 105 mg/dL — ABNORMAL HIGH (ref 70–99)
Glucose-Capillary: 110 mg/dL — ABNORMAL HIGH (ref 70–99)
Glucose-Capillary: 112 mg/dL — ABNORMAL HIGH (ref 70–99)
Glucose-Capillary: 77 mg/dL (ref 70–99)
Glucose-Capillary: 87 mg/dL (ref 70–99)
Glucose-Capillary: 97 mg/dL (ref 70–99)

## 2021-09-19 MED ORDER — SODIUM CHLORIDE 0.9 % IV BOLUS
500.0000 mL | Freq: Once | INTRAVENOUS | Status: AC
  Administered 2021-09-19: 11:00:00 500 mL via INTRAVENOUS

## 2021-09-19 MED ORDER — SEVELAMER CARBONATE 0.8 G PO PACK
0.8000 g | PACK | Freq: Three times a day (TID) | ORAL | Status: DC
  Administered 2021-09-19 – 2021-09-20 (×6): 0.8 g
  Filled 2021-09-19 (×8): qty 1

## 2021-09-19 MED ORDER — FREE WATER
150.0000 mL | Status: DC
  Administered 2021-09-19 – 2021-09-24 (×24): 150 mL

## 2021-09-19 NOTE — Progress Notes (Signed)
Physical Therapy Treatment Patient Details Name: Sherry Baker MRN: [id number] DOB: 07-02-1966 Today's Date: 09/19/2021   History of Present Illness Pt is a 55 y/o female brought to ED after found to be minimally responsive behind a dumpster surrounded by beer cans.  She was hypotensive with tachycardia, agitation and intermittent somnolence.  She was intubated for airway protection and admitted to ICU for complicated EtOH withdrawal.  Initially treated with Precedex which was discontinued due to bradycardia.  She was a started on fentanyl drip, Librium and Ativan as needed.  She also received empiric antibiotics from 11/4-11/8 with Vanco and Unasyn then on Vanco and CTX presumably for respiratory infection. Eventually extubated on 11/10, and transferred to [facility] service on 11/12.  She is on TF via cortrak for dysphagia. PMH including but not limited to EtOH abuse, complicated EtOH withdrawal requiring intubation, cocaine use, tobacco use disorder, CKD-3, HTN, depression, bipolar disorder, vitamin D deficiency, vitamin B12 deficiency, rectal cancer s/p resection, chemo and ostomy.    PT Comments    Pt admitted with above diagnosis. Pt wet on arrival lying in bed. Pt agreed to get OOB with PT and was able to ambulate today with bil HHA of 2 needing mod assist due to ataxic gait as well.  Does respond to cuing but needed constant cuing to progress steps. Called nursing that her dressing came off and to please redress the wound.  Also asked  MD regarding if pt needs air mattress.  Pt was able to ambulate today but needs incr assist. Pt currently with functional limitations due to balance and endurance deficits. Pt will benefit from skilled PT to increase their independence and safety with mobility to allow discharge to the venue listed below.      Recommendations for follow up therapy are one component of a multi-disciplinary discharge planning process, led by the attending physician.  Recommendations may be  updated based on patient status, additional functional criteria and insurance authorization.  Follow Up Recommendations  Skilled nursing-short term rehab (<3 hours/day)     Assistance Recommended at Discharge Frequent or constant Supervision/Assistance  Equipment Recommendations  Other (comment) (defer to next venue of care)    Recommendations for Other Services       Precautions / Restrictions Precautions Precautions: Fall Precaution Comments: NGT, colostomy Restrictions Weight Bearing Restrictions: No     Mobility  Bed Mobility Overal bed mobility: Needs Assistance Bed Mobility: Supine to Sit;Sit to Supine     Supine to sit: Min assist;HOB elevated Sit to supine: Min assist;HOB elevated   General bed mobility comments: increased time and effort, cueing needed for sequencing, min assist with bilateral LE movement onto and off of bed as well as for trunk management    Transfers Overall transfer level: Needs assistance Equipment used: 2 person hand held assist Transfers: Sit to/from Stand;Bed to chair/wheelchair/BSC Sit to Stand: Mod assist;+2 physical assistance;From elevated surface Stand pivot transfers: Mod assist;+2 physical assistance;From elevated surface         General transfer comment: Pts bed very wet on arrival. Noted dressing saturated and removed and notified nurse to redress the area.  Pt stood to her feet with bil UE support. Maintained narrow BOS and flexed at trunk somewhat. Cleaned pts buttocks in standing. Pt stated she needed to use the bathroom therefore walked to bathroom with pt needing +2 mod HHA for safety.    Ambulation/Gait Ambulation/Gait assistance: Mod assist;+2 physical assistance Gait Distance (Feet): 50 Feet Assistive device: 2 person hand held  assist Gait Pattern/deviations: Step-to pattern;Decreased step length - right;Decreased step length - left;Decreased stride length;Shuffle;Narrow base of support;Trunk flexed;Staggering  left;Staggering right;Ataxic   Gait velocity interpretation: <1.31 ft/sec, indicative of household ambulator   General Gait Details: Pt was able to ambulate with +2 mod assist and cues. Pt with very unsteady gait. Did respond to cues to incr BOS and take larger steps but still needed mod assist of 2 throughout.   Stairs             Wheelchair Mobility    Modified Rankin (Stroke Patients Only)       Balance Overall balance assessment: Needs assistance Sitting-balance support: Bilateral upper extremity supported;Feet supported;No upper extremity supported Sitting balance-Leahy Scale: Poor Sitting balance - Comments: pt requiring min guard assist to sit in an upright sitting position for 20 minutes Postural control: Left lateral lean Standing balance support: Bilateral upper extremity supported;During functional activity Standing balance-Leahy Scale: Poor Standing balance comment: Relies on 2 person assist                            Cognition Arousal/Alertness: Awake/alert Behavior During Therapy: Flat affect Overall Cognitive Status: Difficult to assess                                 General Comments: Exhibits limited ability to follow basic one-step commands.   Pt confused intermittently during session.        Exercises      General Comments        Pertinent Vitals/Pain Pain Assessment: No/denies pain    Home Living                          Prior Function            PT Goals (current goals can now be found in the care plan section) Acute Rehab PT Goals Patient Stated Goal: unable to state Progress towards PT goals: Progressing toward goals    Frequency    Min 2X/week      PT Plan Current plan remains appropriate    Co-evaluation              AM-PAC PT "6 Clicks" Mobility   Outcome Measure  Help needed turning from your back to your side while in a flat bed without using bedrails?: A Lot Help  needed moving from lying on your back to sitting on the side of a flat bed without using bedrails?: A Lot Help needed moving to and from a bed to a chair (including a wheelchair)?: Total Help needed standing up from a chair using your arms (e.g., wheelchair or bedside chair)?: Total Help needed to walk in hospital room?: Total Help needed climbing 3-5 steps with a railing? : Total 6 Click Score: 8    End of Session Equipment Utilized During Treatment: Gait belt Activity Tolerance: Patient limited by fatigue;Patient limited by pain Patient left: in bed;with call bell/phone within reach;with bed alarm set;with restraints reapplied Nurse Communication: Mobility status PT Visit Diagnosis: Other abnormalities of gait and mobility (R26.89)     Time: 1001-1019 PT Time Calculation (min) (ACUTE ONLY): 18 min  Charges:  $Gait Training: 8-22 mins                     Kyston Gonce M,PT Acute Rehab Services [phone] (pager)  Alvira Philips 09/19/2021, 1:24 PM

## 2021-09-19 NOTE — Progress Notes (Signed)
Nutrition Follow-up  DOCUMENTATION CODES:   Severe malnutrition in context of chronic illness  INTERVENTION:   Continue Tube Feeds via Cortrak: Osmolite 1.5 @ 45 mL/hr (1080 mL/day) 45 mL ProSource TF - BID 150 mL free water flushes q4h  Provides 1700 kcal, 90 grams of protein, 823 mL free water (1723 mL total free water)  Continue Multivitamin w/ minerals daily  NUTRITION DIAGNOSIS:   Severe Malnutrition related to chronic illness (cirrhosis, polysubstance abuse) as evidenced by severe muscle depletion, severe fat depletion. - Ongoing  GOAL:   Patient will meet greater than or equal to 90% of their needs - Progressing  MONITOR:   TF tolerance, Diet advancement, Labs, Skin  REASON FOR ASSESSMENT:   Consult Enteral/tube feeding initiation and management  ASSESSMENT:   55 year old female who presented to the ED on 11/03 with AMS. PMH of EtOH abuse, cirrhosis, complicated EtOH withdrawals requiring prior intubation, cocaine abuse, HTN, CKD stage III, GERD, rectal cancer s/p resection with colostomy, bipolar 1 disorder, depression, tobacco abuse. Pt admitted with cocaine intoxication and EtOH withdrawal and required intubation for airway protection.  11/09: TF d/c  11/10: extubated; OG removed 11/11: transferred to the floor; Cortrak placed (gastric/D1); TF started   Pt reports that she has been feeling home sick. Reports that is when she is usually nauseous, denies any vomiting. Pt states that her tube hurts when she sneezes; PLDN checked tube marking and discussed with patient to rest on shoulder to help elevate the pressure from hanging down. Pt reports understanding and that the tube will have to stay in for a little while longer until she is able to eat by mouth.  RN at bedside as well, RN reports that tube has been difficult to flush and push meds. Discussed with RN to change lopez valve and make sure she provides adequate flush before and after passing meds.    Medications reviewed and include: Cholecalciferol, Colace, Folic Acid, MVI, Protonix, Miralax, Renvela, Thiamine Labs reviewed: BUN 58, Creatinine 1.61, Sodium 133. Phosphorus 6, Magnesium 2.9  Diet Order:   Diet Order             Diet NPO time specified  Diet effective now                   EDUCATION NEEDS:   No education needs have been identified at this time  Skin:  Skin Assessment: Skin Integrity Issues: Skin Integrity Issues:: Stage III, Other (Comment) Stage III: coccyx Other: skin tear L arm  Last BM:  09/17/2021 - 100 mL via colostomy  Height:   Ht Readings from Last 1 Encounters:  09/05/21 5\' 2"  (1.575 m)    Weight:   Wt Readings from Last 1 Encounters:  09/19/21 45.8 kg    Ideal Body Weight:  50 kg  BMI:  Body mass index is 18.47 kg/m.  Estimated Nutritional Needs:   Kcal:  1600-1800  Protein:  80-95g  Fluid:  >1.6L    Hermina Barters BS, PLDN Clinical Dietitian See AMiON for contact information.

## 2021-09-19 NOTE — Progress Notes (Signed)
PROGRESS NOTE  Sherry Baker CXK:[id number] DOB: 01-31-1966   PCP: Default, Provider, MD  Patient is from: Home  DOA: 09/05/2021 LOS: 14  Chief complaints:  No chief complaint on file.    Brief Narrative / Interim history: 55 year old F with PMH of EtOH abuse, complicated EtOH withdrawal requiring intubation, cocaine use, tobacco use disorder, CKD-3, HTN, depression, bipolar disorder, vitamin D deficiency, vitamin B12 deficiency, rectal cancer s/p resection, chemo and ostomy brought to ED after found to be minimally responsive behind a dumpster surrounded by beer cans.  She was hypotensive with tachycardia, agitation and intermittent somnolence.  She was intubated for airway protection and admitted to ICU for complicated EtOH withdrawal.  Initially treated with Precedex which was discontinued due to bradycardia.  She was a started on fentanyl drip, Librium and Ativan as needed.  She also received empiric antibiotics from 11/4-11/8 with Vanco and Unasyn then on Vanco and CTX presumably for respiratory infection. Eventually extubated on 11/10, and transferred to [facility] service on 11/12.  Encephalopathy improving but confused and continues to require as needed meds and abdominal belt for safety.  Remains on TF via cortrak for dysphagia.  Final disposition likely home.  Doubt she can be placed at SNF with cocaine and alcohol use disorder.  Subjective: Seen and examined earlier this morning.  No major events overnight of this morning.  She is somewhat sleepy but wakes to voice.  No complaints but responds yes to pain.  She says she hurts all over.  Fairly oriented but still confused.  Objective: Vitals:   09/18/21 2012 09/19/21 0456 09/19/21 0622 09/19/21 0744  BP: 111/63 (!) 144/83  (!) 153/79  Pulse: 87 83  69  Resp: 18 18  18   Temp: 98.7 F (37.1 C) 99.1 F (37.3 C)  98.9 F (37.2 C)  TempSrc: Oral Oral  Oral  SpO2: 97% 100%  99%  Weight:   45.8 kg   Height:        Intake/Output Summary  (Last 24 hours) at 09/19/2021 1550 Last data filed at 09/19/2021 1416 Gross per 24 hour  Intake 995 ml  Output --  Net 995 ml   Filed Weights   09/16/21 0500 09/18/21 0405 09/19/21 0622  Weight: 47.6 kg 46.9 kg 45.8 kg    Examination:  GENERAL: Frail looking elderly female.  No apparent distress. HEENT: MMM.  Poor dentition.  Vision and hearing grossly intact.  NECK: Supple.  No apparent JVD.  RESP: 99% on RA.  No IWOB.  Fair aeration bilaterally. CVS:  RRR. Heart sounds normal.  ABD/GI/GU: BS+. Abd soft, NTND.  LLQ ostomy with stool. MSK/EXT:  Moves extremities. No apparent deformity. No edema.  SKIN: Sacral/coccygeal pressure injury as below. NEURO: Awake and alert. Oriented to self, place, month and year.  No apparent focal neuro deficit. PSYCH: Calm. Normal affect.   Procedures:  11/3-11/10-intubation and mechanical ventilation.  Microbiology summarized: 11/3-COVID-19, influenza PCR and full RVP negative 11/3-MRSA PCR positive 11/4-respiratory culture with moderate Streptococcus pneumonia sensitive to CTX 11/4-blood cultures negative.  Assessment & Plan: Acute metabolic encephalopathy-likely related to alcohol.  CTH, ammonia, TSH, B12 and RPR unrevealing. No focal neuro deficit to suggest CVA but limited exam.  Encephalopathy improving.  Oriented to self, place, month and year but is still confused with no great insight. -Completed course of high-dose thiamine. -Continue abdominal belt for safety. -Atarax 10 mg 3 times daily with as needed Ativan for anxiety/agitation. -Passed voiding trial on 11/13.  Complicated alcohol withdrawal requiring  intubation and mechanical ventilation Cocaine use disorder -Completed Librium taper. -Continue multivitamins, folic acid and thiamine. -TOC consulted for counseling or resources  Dysphagia: Likely from intubation and encephalopathy.  SLP recommended n.p.o. -On TF via Cortrak.  May need G-tube if not able to advance diet. -SLP  following.  Abnormal EKG with ST changes Mildly elevated troponin, 24> 54>> 66-likely demand ischemia. Chronic diastolic CHF-TTE on 66/0 with LVEF of 55 to 60%, G1 DD, no RWMA Uncontrolled hypertension: Normotensive. -Added Coreg 6.25 mg twice daily on 11/12. -Continue amlodipine 10 mg daily -Add p.o. hydralazine as needed   Tobacco use disorder -Encourage cessation when able to comprehend -Nicotine patch   AKI on CKD-3A/azotemia: Likely prerenal from n.p.o. Recent Labs    09/07/21 0203 09/08/21 0313 09/09/21 0638 09/10/21 0430 09/11/21 0228 09/12/21 0223 09/14/21 0818 09/15/21 0600 09/18/21 0128 09/19/21 0459  BUN 18 15 15 17 19 18 14  24* 49* 58*  CREATININE 1.32* 1.27* 1.48* 1.39* 1.55* 1.28* 1.35* 1.35* 1.48* 1.61*  -NS bolus 500 cc -Discontinue losartan.  History of vitamin B12 deficiency-B12-473 now History of vitamin D deficiency-vitamin D slightly low at 21.16. -Vitamin D 800 units daily  History of rectal cancer s/p resection w/ostomy -Routine ostomy care   GERD -Continue Protonix   Anxiety/depression/bipolar 1 disorder: Not able to assess this given her mental status. -Continue BuSpar and Celexa -Added Atarax with as needed Ativan   RUE swelling/superficial thrombophlebitis -Axillary line was discontinued  Physical deconditioning due to acute illness and chronic alcohol abuse -PT/OT-recommends SNF.  Neck/back pain -Continue Tylenol and Voltaren gel  Prolonged QT: 542 on 11/3. -Discontinued Seroquel and Haldol -Optimize K and Mg -Repeat EKG today  Severe malnutrition: as evidenced by low BMI and significant muscle mass and subcu fat loss. Body mass index is 18.47 kg/m. Nutrition Problem: Severe Malnutrition Etiology: chronic illness (cirrhosis, polysubstance abuse) Signs/Symptoms: severe muscle depletion, severe fat depletion Interventions: MVI, Tube feeding, Prostat  Pressure skin injury: POA Pressure Injury 09/05/21 Coccyx Mid Stage 3 -   Full thickness tissue loss. Subcutaneous fat may be visible but bone, tendon or muscle are NOT exposed. dehissed surgical wound from 2018; NOT pressure (Active)  09/05/21 2300  Location: Coccyx  Location Orientation: Mid  Staging: Stage 3 -  Full thickness tissue loss. Subcutaneous fat may be visible but bone, tendon or muscle are NOT exposed.  Wound Description (Comments): dehissed surgical wound from 2018; NOT pressure  Present on Admission: Yes  -We will order air mattress  DVT prophylaxis:  heparin injection 5,000 Units Start: 09/06/21 0600 SCDs Start: 09/05/21 2126  Code Status: Full code Family Communication: None at bedside today.   Level of care: Med-Surg Status is: Inpatient  Remains inpatient appropriate because: Encephalopathy requiring soft restraints and dysphagia requiring tube feed.   Final disposition: Therapy recommends SNF but difficult to place with cocaine and alcohol use.  Likely home when medically stable.    Consultants:  Pulmonology   Sch Meds:  Scheduled Meds:  amLODipine  10 mg Per Tube Daily   bethanechol  5 mg Per Tube TID   busPIRone  15 mg Per Tube BID   carvedilol  6.25 mg Per Tube BID WC   Chlorhexidine Gluconate Cloth  6 each Topical Daily   cholecalciferol  800 Units Oral Daily   citalopram  10 mg Per Tube Daily   diclofenac Sodium  4 g Topical QID   docusate  100 mg Per Tube BID   feeding supplement (PROSource TF)  45 mL  Per Tube BID   folic acid  1 mg Per Tube Daily   free water  150 mL Per Tube Q4H   heparin  5,000 Units Subcutaneous Q8H   hydrOXYzine  10 mg Per Tube TID   lidocaine  1 patch Transdermal Q24H   mouth rinse  15 mL Mouth Rinse BID   multivitamin with minerals  1 tablet Per Tube Daily   nicotine  14 mg Transdermal Daily   pantoprazole sodium  40 mg Per Tube QHS   polyethylene glycol  17 g Per Tube Daily   sevelamer carbonate  0.8 g Per Tube TID with meals   sodium chloride flush  10-40 mL Intracatheter Q12H    [START ON 09/21/2021] thiamine  100 mg Oral Daily   Continuous Infusions:  feeding supplement (OSMOLITE 1.5 CAL) 1,000 mL (09/19/21 1220)   thiamine injection 250 mg (09/19/21 1111)   PRN Meds:.acetaminophen (TYLENOL) oral liquid 160 mg/5 mL, labetalol, LORazepam, sodium chloride flush  Antimicrobials: Anti-infectives (From admission, onward)    Start     Dose/Rate Route Frequency Ordered Stop   09/08/21 0830  cefTRIAXone (ROCEPHIN) 2 g in sodium chloride 0.9 % 100 mL IVPB       Note to Pharmacy: Tolerated unasyn   2 g 200 mL/hr over 30 Minutes Intravenous Every 24 hours 09/08/21 0731 09/10/21 0832   09/07/21 1000  vancomycin (VANCOREADY) IVPB 500 mg/100 mL  Status:  Discontinued        500 mg 100 mL/hr over 60 Minutes Intravenous Every 24 hours 09/06/21 1452 09/07/21 1037   09/06/21 1633  Ampicillin-Sulbactam (UNASYN) 3 g in sodium chloride 0.9 % 100 mL IVPB  Status:  Discontinued        3 g 200 mL/hr over 30 Minutes Intravenous Every 8 hours 09/06/21 1258 09/07/21 1037   09/06/21 1545  vancomycin (VANCOREADY) IVPB 1000 mg/200 mL        1,000 mg 200 mL/hr over 60 Minutes Intravenous  Once 09/06/21 1452 09/06/21 1629   09/06/21 0915  Ampicillin-Sulbactam (UNASYN) 3 g in sodium chloride 0.9 % 100 mL IVPB  Status:  Discontinued        3 g 200 mL/hr over 30 Minutes Intravenous Every 12 hours 09/06/21 0817 09/06/21 1258        I have personally reviewed the following labs and images: CBC: Recent Labs  Lab 09/14/21 0818 09/15/21 0600 09/18/21 0128 09/19/21 0459  WBC 5.2 6.2 6.8 6.2  NEUTROABS 3.3  --   --   --   HGB 11.1* 11.4* 10.5* 10.2*  HCT 35.5* 35.4* 33.2* 32.7*  MCV 92.9 89.4 90.2 90.8  PLT 280 358 396 396   BMP &GFR Recent Labs  Lab 09/14/21 0818 09/14/21 1609 09/15/21 0600 09/15/21 1917 09/16/21 0641 09/18/21 0128 09/19/21 0459  NA 136  --  137  --   --  136 133*  K 4.5  --  4.4  --   --  4.7 5.1  CL 103  --  107  --   --  105 103  CO2 21*  --  22  --    --  20* 23  GLUCOSE 113*  --  115*  --   --  78 109*  BUN 14  --  24*  --   --  49* 58*  CREATININE 1.35*  --  1.35*  --   --  1.48* 1.61*  CALCIUM 9.0  --  9.1  --   --  9.4  9.3  MG  --    < > 2.4 2.4 2.4 2.4 2.6*  PHOS  --    < > 4.2  4.2 4.4 4.7* 6.2* 6.0*   < > = values in this interval not displayed.   Estimated Creatinine Clearance: 29.2 mL/min (A) (by C-G formula based on SCr of 1.61 mg/dL (H)). Liver & Pancreas: Recent Labs  Lab 09/14/21 0818 09/15/21 0600 09/18/21 0128 09/19/21 0459  AST 35  --   --  50*  ALT 20  --   --  62*  ALKPHOS 55  --   --  75  BILITOT 0.5  --   --  0.4  PROT 6.7  --   --  7.8  ALBUMIN 2.3* 2.4* 2.7* 2.8*   No results for input(s): LIPASE, AMYLASE in the last 168 hours. Recent Labs  Lab 09/14/21 1053  AMMONIA <10   Diabetic: No results for input(s): HGBA1C in the last 72 hours. Recent Labs  Lab 09/18/21 2008 09/19/21 0007 09/19/21 0426 09/19/21 0742 09/19/21 1150  GLUCAP 109* 105* 77 112* 87   Cardiac Enzymes: Recent Labs  Lab 09/15/21 0600  CKTOTAL 23*   No results for input(s): PROBNP in the last 8760 hours. Coagulation Profile: No results for input(s): INR, PROTIME in the last 168 hours. Thyroid Function Tests: No results for input(s): TSH, T4TOTAL, FREET4, T3FREE, THYROIDAB in the last 72 hours.  Lipid Profile: No results for input(s): CHOL, HDL, LDLCALC, TRIG, CHOLHDL, LDLDIRECT in the last 72 hours. Anemia Panel: No results for input(s): VITAMINB12, FOLATE, FERRITIN, TIBC, IRON, RETICCTPCT in the last 72 hours.  Urine analysis:    Component Value Date/Time   COLORURINE STRAW (A) 09/05/2021 1638   APPEARANCEUR CLEAR 09/05/2021 1638   LABSPEC 1.005 09/05/2021 1638   PHURINE 6.0 09/05/2021 1638   GLUCOSEU NEGATIVE 09/05/2021 1638   HGBUR SMALL (A) 09/05/2021 1638   BILIRUBINUR NEGATIVE 09/05/2021 1638   KETONESUR NEGATIVE 09/05/2021 1638   PROTEINUR 100 (A) 09/05/2021 1638   UROBILINOGEN 0.2 12/23/2017 1417    NITRITE NEGATIVE 09/05/2021 1638   LEUKOCYTESUR NEGATIVE 09/05/2021 1638   Sepsis Labs: Invalid input(s): PROCALCITONIN, [place]  Microbiology: No results found for this or any previous visit (from the past 240 hour(s)).   Radiology Studies: No results found.   Cecilio Ohlrich T. Floyd  If 7PM-7AM, please contact night-coverage [email or web address] 09/19/2021, 3:50 PM

## 2021-09-20 ENCOUNTER — Inpatient Hospital Stay (HOSPITAL_COMMUNITY): Payer: Medicaid Other

## 2021-09-20 DIAGNOSIS — I10 Essential (primary) hypertension: Secondary | ICD-10-CM | POA: Diagnosis not present

## 2021-09-20 DIAGNOSIS — I5032 Chronic diastolic (congestive) heart failure: Secondary | ICD-10-CM | POA: Diagnosis not present

## 2021-09-20 DIAGNOSIS — R9431 Abnormal electrocardiogram [ECG] [EKG]: Secondary | ICD-10-CM | POA: Diagnosis not present

## 2021-09-20 DIAGNOSIS — R5381 Other malaise: Secondary | ICD-10-CM | POA: Diagnosis not present

## 2021-09-20 LAB — RENAL FUNCTION PANEL
Albumin: 2.8 g/dL — ABNORMAL LOW (ref 3.5–5.0)
Anion gap: 9 (ref 5–15)
BUN: 52 mg/dL — ABNORMAL HIGH (ref 6–20)
CO2: 23 mmol/L (ref 22–32)
Calcium: 9.8 mg/dL (ref 8.9–10.3)
Chloride: 100 mmol/L (ref 98–111)
Creatinine, Ser: 1.45 mg/dL — ABNORMAL HIGH (ref 0.44–1.00)
GFR, Estimated: 43 mL/min — ABNORMAL LOW (ref >60)
Glucose, Bld: 96 mg/dL (ref 70–99)
Phosphorus: 5.6 mg/dL — ABNORMAL HIGH (ref 2.5–4.6)
Potassium: 4.7 mmol/L (ref 3.5–5.1)
Sodium: 132 mmol/L — ABNORMAL LOW (ref 135–145)

## 2021-09-20 LAB — HEPATIC FUNCTION PANEL
ALT: 57 U/L — ABNORMAL HIGH (ref 0–44)
AST: 42 U/L — ABNORMAL HIGH (ref 15–41)
Albumin: 2.8 g/dL — ABNORMAL LOW (ref 3.5–5.0)
Alkaline Phosphatase: 69 U/L (ref 38–126)
Bilirubin, Direct: <0.1 mg/dL (ref 0.0–0.2)
Total Bilirubin: 0.2 mg/dL — ABNORMAL LOW (ref 0.3–1.2)
Total Protein: 7.5 g/dL (ref 6.5–8.1)

## 2021-09-20 LAB — GLUCOSE, CAPILLARY
Glucose-Capillary: 111 mg/dL — ABNORMAL HIGH (ref 70–99)
Glucose-Capillary: 119 mg/dL — ABNORMAL HIGH (ref 70–99)
Glucose-Capillary: 89 mg/dL (ref 70–99)
Glucose-Capillary: 89 mg/dL (ref 70–99)
Glucose-Capillary: 92 mg/dL (ref 70–99)
Glucose-Capillary: 99 mg/dL (ref 70–99)

## 2021-09-20 LAB — MAGNESIUM: Magnesium: 2.3 mg/dL (ref 1.7–2.4)

## 2021-09-20 MED ORDER — AMLODIPINE BESYLATE 10 MG PO TABS
10.0000 mg | ORAL_TABLET | Freq: Every day | ORAL | Status: DC
  Administered 2021-09-21 – 2021-09-30 (×10): 10 mg via ORAL
  Filled 2021-09-20 (×10): qty 1

## 2021-09-20 MED ORDER — CARVEDILOL 6.25 MG PO TABS
9.3750 mg | ORAL_TABLET | Freq: Two times a day (BID) | ORAL | Status: DC
  Administered 2021-09-20 – 2021-09-30 (×19): 9.375 mg via ORAL
  Filled 2021-09-20 (×20): qty 1

## 2021-09-20 MED ORDER — CARVEDILOL 3.125 MG PO TABS: 9.3750 mg | ORAL_TABLET | Freq: Two times a day (BID) | ORAL | Status: DC

## 2021-09-20 MED ORDER — ACETAMINOPHEN 325 MG PO TABS
650.0000 mg | ORAL_TABLET | Freq: Four times a day (QID) | ORAL | Status: DC | PRN
  Administered 2021-09-20 – 2021-09-26 (×4): 650 mg via ORAL
  Filled 2021-09-20 (×4): qty 2

## 2021-09-20 MED ORDER — THIAMINE HCL 100 MG PO TABS
100.0000 mg | ORAL_TABLET | Freq: Every day | ORAL | Status: DC
  Administered 2021-09-21 – 2021-09-30 (×10): 100 mg via ORAL
  Filled 2021-09-20 (×10): qty 1

## 2021-09-20 MED ORDER — PANTOPRAZOLE SODIUM 40 MG PO TBEC
40.0000 mg | DELAYED_RELEASE_TABLET | Freq: Every day | ORAL | Status: DC
  Administered 2021-09-20 – 2021-09-30 (×11): 40 mg via ORAL
  Filled 2021-09-20 (×11): qty 1

## 2021-09-20 MED ORDER — PHENOL 1.4 % MT LIQD
1.0000 | OROMUCOSAL | Status: DC | PRN
  Administered 2021-09-20: 1 via OROMUCOSAL
  Filled 2021-09-20: qty 177

## 2021-09-20 MED ORDER — FOLIC ACID 1 MG PO TABS
1.0000 mg | ORAL_TABLET | Freq: Every day | ORAL | Status: DC
  Administered 2021-09-21 – 2021-09-30 (×10): 1 mg via ORAL
  Filled 2021-09-20 (×10): qty 1

## 2021-09-20 MED ORDER — THIAMINE HCL 100 MG/ML IJ SOLN
250.0000 mg | INTRAVENOUS | Status: AC
  Administered 2021-09-20: 250 mg via INTRAVENOUS
  Filled 2021-09-20: qty 2.5

## 2021-09-20 MED ORDER — THIAMINE HCL 100 MG PO TABS: 100.0000 mg | ORAL_TABLET | Freq: Every day | ORAL | Status: DC

## 2021-09-20 MED ORDER — SEVELAMER CARBONATE 800 MG PO TABS
800.0000 mg | ORAL_TABLET | Freq: Three times a day (TID) | ORAL | Status: DC
  Administered 2021-09-20 – 2021-09-30 (×27): 800 mg via ORAL
  Filled 2021-09-20 (×29): qty 1

## 2021-09-20 MED ORDER — POLYETHYLENE GLYCOL 3350 17 G PO PACK
17.0000 g | PACK | Freq: Every day | ORAL | Status: DC
  Administered 2021-09-22 – 2021-09-29 (×8): 17 g via ORAL
  Filled 2021-09-20 (×10): qty 1

## 2021-09-20 MED ORDER — HYDROXYZINE HCL 10 MG PO TABS
10.0000 mg | ORAL_TABLET | Freq: Three times a day (TID) | ORAL | Status: DC
  Administered 2021-09-20 – 2021-09-30 (×30): 10 mg via ORAL
  Filled 2021-09-20 (×33): qty 1

## 2021-09-20 MED ORDER — VITAMIN D 25 MCG (1000 UNIT) PO TABS
1000.0000 [IU] | ORAL_TABLET | Freq: Every day | ORAL | Status: DC
  Administered 2021-09-20: 1000 [IU]
  Filled 2021-09-20 (×2): qty 1

## 2021-09-20 MED ORDER — BUSPIRONE HCL 15 MG PO TABS
15.0000 mg | ORAL_TABLET | Freq: Two times a day (BID) | ORAL | Status: DC
  Administered 2021-09-20 – 2021-09-30 (×20): 15 mg via ORAL
  Filled 2021-09-20 (×20): qty 1

## 2021-09-20 MED ORDER — VITAMIN D 25 MCG (1000 UNIT) PO TABS
1000.0000 [IU] | ORAL_TABLET | Freq: Every day | ORAL | Status: DC
Start: 2021-09-21 — End: 2021-09-30
  Administered 2021-09-21 – 2021-09-30 (×10): 1000 [IU] via ORAL
  Filled 2021-09-20 (×10): qty 1

## 2021-09-20 MED ORDER — DOCUSATE SODIUM 100 MG PO CAPS
100.0000 mg | ORAL_CAPSULE | Freq: Every day | ORAL | Status: DC
  Administered 2021-09-20 – 2021-09-30 (×10): 100 mg via ORAL
  Filled 2021-09-20 (×12): qty 1

## 2021-09-20 MED ORDER — ADULT MULTIVITAMIN W/MINERALS CH
1.0000 | ORAL_TABLET | Freq: Every day | ORAL | Status: DC
  Administered 2021-09-21 – 2021-09-30 (×10): 1 via ORAL
  Filled 2021-09-20 (×10): qty 1

## 2021-09-20 MED ORDER — CITALOPRAM HYDROBROMIDE 20 MG PO TABS
10.0000 mg | ORAL_TABLET | Freq: Every day | ORAL | Status: DC
  Administered 2021-09-21 – 2021-09-30 (×10): 10 mg via ORAL
  Filled 2021-09-20 (×10): qty 1

## 2021-09-20 NOTE — Progress Notes (Signed)
Modified Barium Swallow Progress Note  Patient Details  Name: Sherry Baker MRN: [id number] Date of Birth: [birth year]  Today's Date: 09/20/2021  Modified Barium Swallow completed.  Full report located under Chart Review in the Imaging Section.  Brief recommendations include the following:  Clinical Impression  Pt has piecemeal swallowing with thin liquids with swallow initiated consistently at the pyriform sinuses. She maintains good airway protection with trace penetration that occurs during the swallow with consecutive straw sips (PAS #2). This is still functional given that penetrates clear spontaneously, but could perhaps be improved further by removal of cortrak too as the epiglottis does appear to make contact with it, perhaps not inverting to its full potential. Oropharyngeal swallow is [facility] with purees and solids with adequate mastication even with reduced dentition. Recommend regular solids and thin liquids. Pt was educated on use of general aspiration precautions as well. Considering that her mentation has been fluctuating and positioning was reduced at bedside, recommend brief f/u for safety without longer term needs anticipated.   Swallow Evaluation Recommendations       SLP Diet Recommendations: Regular solids;Thin liquid   Liquid Administration via: Cup;Straw   Medication Administration: Whole meds with liquid   Supervision: Patient able to self feed;Intermittent supervision to cue for compensatory strategies;Comment (set up assistance to ensure adequate positioning for meals)   Compensations: Slow rate;Small sips/bites;Minimize environmental distractions   Postural Changes: Seated upright at 90 degrees   Oral Care Recommendations: Oral care BID        Osie Bond., M.A. Micro Pager [phone] Office [phone]  09/20/2021,2:36 PM

## 2021-09-20 NOTE — Progress Notes (Signed)
PROGRESS NOTE  Sherry Baker JGO:[id number] DOB: [birth year]   PCP: Default, Provider, MD  Patient is from: Home  DOA: 09/05/2021 LOS: 15  Chief complaints:  No chief complaint on file.    Brief Narrative / Interim history: 55 year old F with PMH of EtOH abuse, complicated EtOH withdrawal requiring intubation, cocaine use, tobacco use disorder, CKD-3, HTN, depression, bipolar disorder, vitamin D deficiency, vitamin B12 deficiency, rectal cancer s/p resection, chemo and ostomy brought to ED after found to be minimally responsive behind a dumpster surrounded by beer cans.  She was hypotensive with tachycardia, agitation and intermittent somnolence.  She was intubated for airway protection and admitted to ICU for complicated EtOH withdrawal.  Initially treated with Precedex which was discontinued due to bradycardia.  She was a started on fentanyl drip, Librium and Ativan as needed.  She also received empiric antibiotics from 11/4-11/8 with Vanco and Unasyn then on Vanco and CTX presumably for respiratory infection. Eventually extubated on 11/10, and transferred to [facility] service on 11/12.  Encephalopathy improved.  Dysphagia resolved.  Started on regular diet on 11/18.  Therapy recommended SNF but difficult to place.  Subjective: Seen and examined earlier this morning.  No major events overnight of this morning.  Complaining pain over his backside.  She is working with SLP.  Seems to be doing well with SLP.  She is excited to have the feeding tube out and enjoy food.  Objective: Vitals:   09/19/21 1646 09/19/21 2003 09/20/21 0449 09/20/21 0823  BP: 131/81 128/81 (!) 165/87 (!) 150/79  Pulse: 92 87 81 80  Resp: '17 18 18 16  ' Temp: 99.1 F (37.3 C) 99 F (37.2 C) 98.4 F (36.9 C) 98.8 F (37.1 C)  TempSrc: Oral Oral Oral Oral  SpO2: 96% 98% 99% 100%  Weight:   45.6 kg   Height:        Intake/Output Summary (Last 24 hours) at 09/20/2021 1453 Last data filed at 09/20/2021 0900 Gross per 24  hour  Intake 0 ml  Output --  Net 0 ml   Filed Weights   09/18/21 0405 09/19/21 0622 09/20/21 0449  Weight: 46.9 kg 45.8 kg 45.6 kg    Examination:  GENERAL: No apparent distress.  Nontoxic. HEENT: MMM.  Poor dentition.  Vision and hearing grossly intact.  NECK: Supple.  No apparent JVD.  RESP: 100% on RA.  No IWOB.  Fair aeration bilaterally. CVS:  RRR. Heart sounds normal.  ABD/GI/GU: BS+. Abd soft, NTND.  LLQ ostomy. MSK/EXT:  Moves extremities. No apparent deformity. No edema.  SKIN: Sacral/coccygeal pressure injury.  Unstageable.  No drainage or sign of infection. NEURO: Awake and alert. Oriented appropriately.  No apparent focal neuro deficit. PSYCH: Calm. Normal affect.   Procedures:  11/3-11/10-intubation and mechanical ventilation.  Microbiology summarized: 11/3-COVID-19, influenza PCR and full RVP negative 11/3-MRSA PCR positive 11/4-respiratory culture with moderate Streptococcus pneumonia sensitive to CTX 11/4-blood cultures negative.  Assessment & Plan: Acute metabolic encephalopathy-likely related to alcohol.  CTH, ammonia, TSH, B12 and RPR unrevealing. No focal neuro deficit to suggest CVA but limited exam.  Encephalopathy resolving. -Completed course of high-dose thiamine. -Atarax 10 mg 3 times daily with as needed Ativan for anxiety/agitation. -Passed voiding trial on 11/13. -Passed swallowing eval on 11/18-now on regular solids; thin liquids  Complicated alcohol withdrawal requiring intubation and mechanical ventilation Cocaine use disorder -Completed Librium taper and high-dose thiamine as above. -Continue multivitamins, folic acid and thiamine. -TOC consulted for counseling or resources  Dysphagia: Likely from  intubation and encephalopathy.  Resolved. -Now on regular diet -Will monitor p.o. intake to ensure adequate calorie intake.  Abnormal EKG with ST changes Mildly elevated troponin, 24> 54>> 66-likely demand ischemia. Chronic diastolic  CHF-TTE on 92/1 with LVEF of 55 to 60%, G1 DD, no RWMA Uncontrolled hypertension: Slightly hypotensive today likely from stopping losartan.. -Increase Coreg to 9.375 mg twice daily. -Continue amlodipine 10 mg daily -P.o. hydralazine as needed   Tobacco use disorder -Encourage cessation when able to comprehend -Nicotine patch   AKI on CKD-3A/azotemia: Likely prerenal from n.p.o. improved. Recent Labs    09/08/21 0313 09/09/21 0638 09/10/21 0430 09/11/21 0228 09/12/21 0223 09/14/21 0818 09/15/21 0600 09/18/21 0128 09/19/21 0459 09/20/21 0418  BUN '15 15 17 19 18 14 ' 24* 49* 58* 52*  CREATININE 1.27* 1.48* 1.39* 1.55* 1.28* 1.35* 1.35* 1.48* 1.61* 1.45*  -Monitor intermittently.  History of vitamin B12 deficiency-B12-473 now History of vitamin D deficiency-vitamin D slightly low at 21.16. -Vitamin D 800 units daily  History of rectal cancer s/p resection w/ostomy -Routine ostomy care   GERD -Continue Protonix   Anxiety/depression/bipolar 1 disorder: Not able to assess this given her mental status. -Continue BuSpar and Celexa -Continue Atarax with as needed Ativan   RUE swelling/superficial thrombophlebitis -Axillary line was discontinued  Physical deconditioning due to acute illness and chronic alcohol abuse -PT/OT-recommends SNF.  Neck/back pain -Continue Tylenol and Voltaren gel  Prolonged QT: Resolved on repeat EKG. -Optimize K and Mg -Repeat EKG today  Severe malnutrition: as evidenced by low BMI and significant muscle mass and subcu fat loss. Body mass index is 18.39 kg/m. Nutrition Problem: Severe Malnutrition Etiology: chronic illness (cirrhosis, polysubstance abuse) Signs/Symptoms: severe muscle depletion, severe fat depletion Interventions: MVI, Tube feeding, Prostat  Pressure skin injury: POA.  Unstageable.  No signs of infection.  No drainage. Pressure Injury 09/05/21 Coccyx Mid Stage 3 -  Full thickness tissue loss. Subcutaneous fat may be visible  but bone, tendon or muscle are NOT exposed. dehissed surgical wound from 2018; NOT pressure (Active)  09/05/21 2300  Location: Coccyx  Location Orientation: Mid  Staging: Stage 3 -  Full thickness tissue loss. Subcutaneous fat may be visible but bone, tendon or muscle are NOT exposed.  Wound Description (Comments): dehissed surgical wound from 2018; NOT pressure  Present on Admission: Yes  -We will order air mattress  DVT prophylaxis:  heparin injection 5,000 Units Start: 09/06/21 0600 SCDs Start: 09/05/21 2126  Code Status: Full code Family Communication: None at bedside today.   Level of care: Med-Surg Status is: Inpatient  Remains inpatient appropriate because: Monitor oral intake to ensure adequate caloric intake and safe disposition   Final disposition: Therapy recommends SNF but difficult to place with cocaine and alcohol use.     Consultants:  Pulmonology   Sch Meds:  Scheduled Meds:  amLODipine  10 mg Per Tube Daily   bethanechol  5 mg Per Tube TID   busPIRone  15 mg Per Tube BID   carvedilol  6.25 mg Per Tube BID WC   Chlorhexidine Gluconate Cloth  6 each Topical Daily   cholecalciferol  1,000 Units Per Tube Daily   citalopram  10 mg Per Tube Daily   diclofenac Sodium  4 g Topical QID   docusate  100 mg Per Tube BID   feeding supplement (PROSource TF)  45 mL Per Tube BID   folic acid  1 mg Per Tube Daily   free water  150 mL Per Tube Q4H  heparin  5,000 Units Subcutaneous Q8H   hydrOXYzine  10 mg Per Tube TID   lidocaine  1 patch Transdermal Q24H   mouth rinse  15 mL Mouth Rinse BID   multivitamin with minerals  1 tablet Per Tube Daily   nicotine  14 mg Transdermal Daily   pantoprazole sodium  40 mg Per Tube QHS   polyethylene glycol  17 g Per Tube Daily   sevelamer carbonate  0.8 g Per Tube TID with meals   sodium chloride flush  10-40 mL Intracatheter Q12H   [START ON 09/21/2021] thiamine  100 mg Per Tube Daily   Continuous Infusions:  feeding  supplement (OSMOLITE 1.5 CAL) 1,000 mL (09/20/21 1431)   PRN Meds:.acetaminophen (TYLENOL) oral liquid 160 mg/5 mL, labetalol, LORazepam, phenol, sodium chloride flush  Antimicrobials: Anti-infectives (From admission, onward)    Start     Dose/Rate Route Frequency Ordered Stop   09/08/21 0830  cefTRIAXone (ROCEPHIN) 2 g in sodium chloride 0.9 % 100 mL IVPB       Note to Pharmacy: Tolerated unasyn   2 g 200 mL/hr over 30 Minutes Intravenous Every 24 hours 09/08/21 0731 09/10/21 0832   09/07/21 1000  vancomycin (VANCOREADY) IVPB 500 mg/100 mL  Status:  Discontinued        500 mg 100 mL/hr over 60 Minutes Intravenous Every 24 hours 09/06/21 1452 09/07/21 1037   09/06/21 1633  Ampicillin-Sulbactam (UNASYN) 3 g in sodium chloride 0.9 % 100 mL IVPB  Status:  Discontinued        3 g 200 mL/hr over 30 Minutes Intravenous Every 8 hours 09/06/21 1258 09/07/21 1037   09/06/21 1545  vancomycin (VANCOREADY) IVPB 1000 mg/200 mL        1,000 mg 200 mL/hr over 60 Minutes Intravenous  Once 09/06/21 1452 09/06/21 1629   09/06/21 0915  Ampicillin-Sulbactam (UNASYN) 3 g in sodium chloride 0.9 % 100 mL IVPB  Status:  Discontinued        3 g 200 mL/hr over 30 Minutes Intravenous Every 12 hours 09/06/21 0817 09/06/21 1258        I have personally reviewed the following labs and images: CBC: Recent Labs  Lab 09/14/21 0818 09/15/21 0600 09/18/21 0128 09/19/21 0459  WBC 5.2 6.2 6.8 6.2  NEUTROABS 3.3  --   --   --   HGB 11.1* 11.4* 10.5* 10.2*  HCT 35.5* 35.4* 33.2* 32.7*  MCV 92.9 89.4 90.2 90.8  PLT 280 358 396 396   BMP &GFR Recent Labs  Lab 09/14/21 0818 09/14/21 1609 09/15/21 0600 09/15/21 1917 09/16/21 0641 09/18/21 0128 09/19/21 0459 09/20/21 0418  NA 136  --  137  --   --  136 133* 132*  K 4.5  --  4.4  --   --  4.7 5.1 4.7  CL 103  --  107  --   --  105 103 100  CO2 21*  --  22  --   --  20* 23 23  GLUCOSE 113*  --  115*  --   --  78 109* 96  BUN 14  --  24*  --   --  49*  58* 52*  CREATININE 1.35*  --  1.35*  --   --  1.48* 1.61* 1.45*  CALCIUM 9.0  --  9.1  --   --  9.4 9.3 9.8  MG  --    < > 2.4 2.4 2.4 2.4 2.6* 2.3  PHOS  --    < >  4.2  4.2 4.4 4.7* 6.2* 6.0* 5.6*   < > = values in this interval not displayed.   Estimated Creatinine Clearance: 32.3 mL/min (A) (by C-G formula based on SCr of 1.45 mg/dL (H)). Liver & Pancreas: Recent Labs  Lab 09/14/21 0818 09/15/21 0600 09/18/21 0128 09/19/21 0459 09/20/21 0418  AST 35  --   --  50* 42*  ALT 20  --   --  62* 57*  ALKPHOS 55  --   --  75 69  BILITOT 0.5  --   --  0.4 0.2*  PROT 6.7  --   --  7.8 7.5  ALBUMIN 2.3* 2.4* 2.7* 2.8* 2.8*  2.8*   No results for input(s): LIPASE, AMYLASE in the last 168 hours. Recent Labs  Lab 09/14/21 1053  AMMONIA <10   Diabetic: No results for input(s): HGBA1C in the last 72 hours. Recent Labs  Lab 09/19/21 1959 09/20/21 0014 09/20/21 0420 09/20/21 0820 09/20/21 1212  GLUCAP 110* 89 89 92 99   Cardiac Enzymes: Recent Labs  Lab 09/15/21 0600  CKTOTAL 23*   No results for input(s): PROBNP in the last 8760 hours. Coagulation Profile: No results for input(s): INR, PROTIME in the last 168 hours. Thyroid Function Tests: No results for input(s): TSH, T4TOTAL, FREET4, T3FREE, THYROIDAB in the last 72 hours.  Lipid Profile: No results for input(s): CHOL, HDL, LDLCALC, TRIG, CHOLHDL, LDLDIRECT in the last 72 hours. Anemia Panel: No results for input(s): VITAMINB12, FOLATE, FERRITIN, TIBC, IRON, RETICCTPCT in the last 72 hours.  Urine analysis:    Component Value Date/Time   COLORURINE STRAW (A) 09/05/2021 1638   APPEARANCEUR CLEAR 09/05/2021 1638   LABSPEC 1.005 09/05/2021 1638   PHURINE 6.0 09/05/2021 1638   GLUCOSEU NEGATIVE 09/05/2021 1638   HGBUR SMALL (A) 09/05/2021 1638   BILIRUBINUR NEGATIVE 09/05/2021 1638   KETONESUR NEGATIVE 09/05/2021 1638   PROTEINUR 100 (A) 09/05/2021 1638   UROBILINOGEN 0.2 12/23/2017 1417   NITRITE NEGATIVE  09/05/2021 1638   LEUKOCYTESUR NEGATIVE 09/05/2021 1638   Sepsis Labs: Invalid input(s): PROCALCITONIN, [place]  Microbiology: No results found for this or any previous visit (from the past 240 hour(s)).   Radiology Studies: DG Swallowing Func-Speech Pathology  Result Date: 09/20/2021 Table formatting from the original result was not included. Objective Swallowing Evaluation: Type of Study: MBS-Modified Barium Swallow Study  Patient Details Name: Sherry Baker MRN: [id number] Date of Birth: 11-15-1965 Today's Date: 09/20/2021 Time: SLP Start Time (ACUTE ONLY): 1335 -SLP Stop Time (ACUTE ONLY): 1350 SLP Time Calculation (min) (ACUTE ONLY): 15 min Past Medical History: Past Medical History: Diagnosis Date  Alcohol abuse   Allergy   PCNS swelling  Arthritis   Bipolar 1 disorder ([place])   Cancer ([place]) 01/21/2017  rectal cancer  Cancer ([place])   Cirrhosis of liver ([place])   Depression   Genetic testing 03/24/2017  Ms. Hollibaugh underwent genetic counseling and testing for hereditary cancer syndromes on 02/17/2017. Her results were negative for mutations in all 46 genes analyzed by [facility]'s 46-gene Common Hereditary Cancers Panel. Genes analyzed include: APC, ATM, AXIN2, BARD1, BMPR1A, BRCA1, BRCA2, BRIP1, CDH1, CDKN2A, CHEK2, CTNNA1, DICER1, EPCAM, GREM1, HOXB13, KIT, MEN1, MLH1, MSH2, MSH3, MSH6, MUTYH, NBN,  Hypertension  Past Surgical History: Past Surgical History: Procedure Laterality Date  ABDOMINAL PERINEAL BOWEL RESECTION N/A 06/18/2017  Procedure: ABDOMINAL PERINEAL RESECTION ERAS PATHWAY;  Surgeon: Leighton Ruff, MD;  Location: [facility];  Service: General;  Laterality: N/A;  COLON SURGERY    COLONOSCOPY WITH  PROPOFOL Left 01/21/2017  Procedure: COLONOSCOPY WITH PROPOFOL;  Surgeon: Otis Brace, MD;  Location: [facility];  Service: Gastroenterology;  Laterality: Left;  FRACTURE SURGERY    MANDIBLE FRACTURE SURGERY   HPI: 55 yo F admitted to the hospital for cocaine intoxication and etoh withdrawal. In  the ED the pt was exhibiting hypertension, tachycardia and reportedly intermittent agitation so received 76m of geodon and 228mof ativan. Pt was intermittently somnolent prior to and after the meds in the ED. PCCM consulted for evaluation and pt was found to not be able to protect her airway, subsequently was intubated and admitted. CXR 11/7 concerning for BLL opacities. Pt with ETT 11/3-11/10. Pt is known to this service from prior admissions.  She was unable to participate in assessment in May of this year.  In August 2020 pt was seen at bedside with recommendations for mechanical soft solids and thin liquid.  Pt with prior medical history of ETOH use disorder, complicated etoh withdrawals requiring prior intubation; Cocaine abuse; HTN; CKD3; GERD; Bipolar 1; Depression; Tobacco dependence; Rectal cancer s/p resection, chemo, and s/p ostomy; BSE completed 09/13/21 with recommendation for NPO with potential temporary non-oral feeding; Cortrak placed on 09/13/21.  SLE f/u for PO readiness.  Subjective: alert, cooperative, eager to eat and drink  Recommendations for follow up therapy are one component of a multi-disciplinary discharge planning process, led by the attending physician.  Recommendations may be updated based on patient status, additional functional criteria and insurance authorization. Assessment / Plan / Recommendation Clinical Impressions 09/20/2021 Clinical Impression Pt has piecemeal swallowing with thin liquids with swallow initiated consistently at the pyriform sinuses. She maintains good airway protection with trace penetration that occurs during the swallow with consecutive straw sips (PAS #2). This is still functional given that penetrates clear spontaneously, but could perhaps be improved further by removal of cortrak too as the epiglottis does appear to make contact with it, perhaps not inverting to its full potential. [facility] and solids with adequate  mastication even with reduced dentition. Recommend regular solids and thin liquids. Pt was educated on use of general aspiration precautions as well. Considering that her mentation has been fluctuating and positioning was reduced at bedside, recommend brief f/u for safety without longer term needs anticipated.  SLP Visit Diagnosis Dysphagia, unspecified (R13.10) Attention and concentration deficit following -- Frontal lobe and executive function deficit following -- Impact on safety and function Mild aspiration risk   Treatment Recommendations 09/20/2021 Treatment Recommendations Therapy as outlined in treatment plan below   Prognosis 09/20/2021 Prognosis for Safe Diet Advancement Good Barriers to Reach Goals Cognitive deficits Barriers/Prognosis Comment -- Diet Recommendations 09/20/2021 SLP Diet Recommendations Regular solids;Thin liquid Liquid Administration via Cup;Straw Medication Administration Whole meds with liquid Compensations Slow rate;Small sips/bites;Minimize environmental distractions Postural Changes Seated upright at 90 degrees   Other Recommendations 09/20/2021 Recommended Consults -- Oral Care Recommendations Oral care BID Other Recommendations -- Follow Up Recommendations No SLP follow up Assistance recommended at discharge Set up Supervision/Assistance Functional Status Assessment Patient has had a recent decline in their functional status and demonstrates the ability to make significant improvements in function in a reasonable and predictable amount of time. Frequency and Duration  09/20/2021 Speech Therapy Frequency (ACUTE ONLY) min 1 x/week Treatment Duration 1 week   Oral Phase 09/20/2021 Oral Phase Impaired Oral - Pudding Teaspoon -- Oral - Pudding Cup -- Oral - Honey Teaspoon -- Oral - Honey Cup -- Oral - Nectar Teaspoon -- Oral -  Nectar Cup -- Oral - Nectar Straw -- Oral - Thin Teaspoon -- Oral - Thin Cup Piecemeal swallowing Oral - Thin Straw Piecemeal swallowing Oral - Puree WFL Oral -  Mech Soft -- Oral - Regular WFL Oral - Multi-Consistency -- Oral - Pill WFL Oral Phase - Comment --  Pharyngeal Phase 09/20/2021 Pharyngeal Phase Impaired Pharyngeal- Pudding Teaspoon -- Pharyngeal -- Pharyngeal- Pudding Cup -- Pharyngeal -- Pharyngeal- Honey Teaspoon -- Pharyngeal -- Pharyngeal- Honey Cup -- Pharyngeal -- Pharyngeal- Nectar Teaspoon -- Pharyngeal -- Pharyngeal- Nectar Cup -- Pharyngeal -- Pharyngeal- Nectar Straw -- Pharyngeal -- Pharyngeal- Thin Teaspoon -- Pharyngeal -- Pharyngeal- Thin Cup WFL Pharyngeal -- Pharyngeal- Thin Straw Penetration/Aspiration during swallow;Penetration/Aspiration before swallow;Reduced airway/laryngeal closure Pharyngeal Material enters airway, remains ABOVE vocal cords then ejected out Pharyngeal- Puree WFL Pharyngeal -- Pharyngeal- Mechanical Soft -- Pharyngeal -- Pharyngeal- Regular WFL Pharyngeal -- Pharyngeal- Multi-consistency -- Pharyngeal -- Pharyngeal- Pill WFL Pharyngeal -- Pharyngeal Comment --  Cervical Esophageal Phase  09/20/2021 Cervical Esophageal Phase WFL Pudding Teaspoon -- Pudding Cup -- Honey Teaspoon -- Honey Cup -- Nectar Teaspoon -- Nectar Cup -- Nectar Straw -- Thin Teaspoon -- Thin Cup -- Thin Straw -- Puree -- Mechanical Soft -- Regular -- Multi-consistency -- Pill -- Cervical Esophageal Comment -- Osie Bond., M.A. [facility] Pager [phone] Office [phone] 09/20/2021, 2:38 PM                       Stepanie Graver T. [place]  If 7PM-7AM, please contact night-coverage [email or web address] 09/20/2021, 2:53 PM

## 2021-09-20 NOTE — Progress Notes (Signed)
Physical Therapy Treatment Patient Details Name: BONNYE HALLE MRN: [id number] DOB: 23-Dec-1965 Today's Date: 09/20/2021   History of Present Illness Pt is a 55 y/o female brought to ED after found to be minimally responsive behind a dumpster surrounded by beer cans.  She was hypotensive with tachycardia, agitation and intermittent somnolence.  She was intubated for airway protection and admitted to ICU for complicated EtOH withdrawal.  Initially treated with Precedex which was discontinued due to bradycardia.  She was a started on fentanyl drip, Librium and Ativan as needed.  She also received empiric antibiotics from 11/4-11/8 with Vanco and Unasyn then on Vanco and CTX presumably for respiratory infection. Eventually extubated on 11/10, and transferred to [facility] service on 11/12.  She is on TF via cortrak for dysphagia. PMH including but not limited to EtOH abuse, complicated EtOH withdrawal requiring intubation, cocaine use, tobacco use disorder, CKD-3, HTN, depression, bipolar disorder, vitamin D deficiency, vitamin B12 deficiency, rectal cancer s/p resection, chemo and ostomy.    PT Comments    Pt resting in bed, recently back from swallow study. Pt agreeable to OOB. Pt soiled in urine in bed, PT assisted pt to bathroom for cleanup and change of clothes. Pt requiring overall min assist for short-distance room gait and further distance hallway gait with both Ues supported, requires mod-max cuing for safety throughout mobility. Pt continuing to progress well with mobility, SNF remains appropriate.     Recommendations for follow up therapy are one component of a multi-disciplinary discharge planning process, led by the attending physician.  Recommendations may be updated based on patient status, additional functional criteria and insurance authorization.  Follow Up Recommendations  Skilled nursing-short term rehab (<3 hours/day)     Assistance Recommended at Discharge Frequent or constant  Supervision/Assistance  Equipment Recommendations  Other (comment) (defer)    Recommendations for Other Services       Precautions / Restrictions Precautions Precautions: Fall Precaution Comments: NGT, colostomy Restrictions Weight Bearing Restrictions: No     Mobility  Bed Mobility Overal bed mobility: Needs Assistance Bed Mobility: Supine to Sit;Sit to Supine     Supine to sit: HOB elevated;Min guard Sit to supine: HOB elevated;Min guard   General bed mobility comments: for safety, no physical assist but requires sequencing cues    Transfers Overall transfer level: Needs assistance Equipment used: 1 person hand held assist Transfers: Sit to/from Stand Sit to Stand: Min assist           General transfer comment: assist for rise, steadying. STS x3, from EOB, toilet, and BSC at sink during wash up.    Ambulation/Gait Ambulation/Gait assistance: Min assist Gait Distance (Feet): 120 Feet Assistive device: 1 person hand held assist;IV Pole Gait Pattern/deviations: Decreased stride length;Shuffle;Narrow base of support;Trunk flexed;Ataxic;Step-through pattern;Scissoring;Drifts right/left Gait velocity: decr     General Gait Details: light steadying assist, pt witnessed with narrow BOS and x1 scissor but pt-corrected. Cues for avoiding obstacles/safety   Stairs             Wheelchair Mobility    Modified Rankin (Stroke Patients Only)       Balance Overall balance assessment: Needs assistance Sitting-balance support: Feet supported;No upper extremity supported Sitting balance-Leahy Scale: Fair Sitting balance - Comments: sat about 3 minutes EOB   Standing balance support: Bilateral upper extremity supported;During functional activity Standing balance-Leahy Scale: Poor Standing balance comment: Relies on 2 person assist  Cognition Arousal/Alertness: Awake/alert Behavior During Therapy: Flat affect Overall  Cognitive Status: Difficult to assess                                 General Comments: A&Ox3, states year is 2023. Pt can be impulsive, requires cues to wait for PT assist given fall risk. Functional Status Assessment: Patient has had a recent decline in their functional status and demonstrates the ability to make significant improvements in function in a reasonable and predictable amount of time.      Exercises      General Comments        Pertinent Vitals/Pain Pain Assessment: Faces Faces Pain Scale: No hurt Pain Location: rectum Pain Descriptors / Indicators: Grimacing;Restless Pain Intervention(s): Monitored during session    Home Living                          Prior Function            PT Goals (current goals can now be found in the care plan section) Acute Rehab PT Goals PT Goal Formulation: With patient Time For Goal Achievement: 09/29/21 Potential to Achieve Goals: Fair Progress towards PT goals: Progressing toward goals    Frequency    Min 2X/week      PT Plan Current plan remains appropriate    Co-evaluation              AM-PAC PT "6 Clicks" Mobility   Outcome Measure  Help needed turning from your back to your side while in a flat bed without using bedrails?: A Little Help needed moving from lying on your back to sitting on the side of a flat bed without using bedrails?: A Little Help needed moving to and from a bed to a chair (including a wheelchair)?: A Lot Help needed standing up from a chair using your arms (e.g., wheelchair or bedside chair)?: A Little Help needed to walk in hospital room?: A Lot Help needed climbing 3-5 steps with a railing? : A Lot 6 Click Score: 15    End of Session   Activity Tolerance: Patient limited by fatigue;Patient limited by pain Patient left: in bed;with call bell/phone within reach;with bed alarm set;with restraints reapplied Nurse Communication: Mobility status PT Visit  Diagnosis: Other abnormalities of gait and mobility (R26.89)     Time: 1027-2536 PT Time Calculation (min) (ACUTE ONLY): 17 min  Charges:  $Gait Training: 8-22 mins                     Stacie Glaze, PT DPT Acute Rehabilitation Services Pager [phone]  Office [phone]   Roxine Caddy E Ruffin Pyo 09/20/2021, 3:40 PM

## 2021-09-20 NOTE — Progress Notes (Signed)
Speech Language Pathology Treatment: Dysphagia  Patient Details Name: Sherry Baker MRN: [id number] DOB: [birth year] Today's Date: 09/20/2021 Time:  -     Assessment / Plan / Recommendation Clinical Impression  Pt seen for ongoing dysphagia management.   Mental status is much improved.  Pt in suboptimal positioning (fetal position on R side) but able to roll to back and stay at midline. Pt was able to participate in OME without any focal impairments noted.  With straw sips of thin liquid there was intermittent wet vocal quality and throat clearing. MD assessed pt and assisted with repositioning. Slight wet, dysphonic vocal quality persisted after this.  With nectar thick liquids there were no clinical s/s of aspiration.  Pt was able to consume 4 oz rapidly via serial straw sips.  Pt tolerated puree and regular solid with no clinical s/s of aspiration and exhibited good oral clearance.  Recommend regular texture diet with nectar thick liquids pending results of MBSS scheduled for later this date to assess pharyngeal swallow function and determine safe, least restrictive diet.    HPI HPI: 55 yo F admitted to the hospital for cocaine intoxication and etoh withdrawal. In the ED the pt was exhibiting hypertension, tachycardia and reportedly intermittent agitation so received 20mg  of geodon and 2mg  of ativan. Pt was intermittently somnolent prior to and after the meds in the ED. PCCM consulted for evaluation and pt was found to not be able to protect her airway, subsequently was intubated and admitted. CXR 11/7 concerning for BLL opacities. Pt with ETT 11/3-11/10. Pt is known to this service from prior admissions.  She was unable to participate in assessment in May of this year.  In August 2020 pt was seen at bedside with recommendations for mechanical soft solids and thin liquid.  Pt with prior medical history of ETOH use disorder, complicated etoh withdrawals requiring prior intubation; Cocaine abuse; HTN;  CKD3; GERD; Bipolar 1; Depression; Tobacco dependence; Rectal cancer s/p resection, chemo, and s/p ostomy; BSE completed 09/13/21 with recommendation for NPO with potential temporary non-oral feeding; Cortrak placed on 09/13/21.  SLE f/u for PO readiness.      SLP Plan  MBS      Recommendations for follow up therapy are one component of a multi-disciplinary discharge planning process, led by the attending physician.  Recommendations may be updated based on patient status, additional functional criteria and insurance authorization.    Recommendations  Diet recommendations: Regular;Nectar-thick liquid Medication Administration: Whole meds with liquid Supervision: Patient able to self feed Compensations: Slow rate;Small sips/bites Postural Changes and/or Swallow Maneuvers: Seated upright 90 degrees                Oral Care Recommendations: Oral care BID Follow Up Recommendations:  (Pending results of MBSS) Assistance recommended at discharge:  (Pending results of MBSS) SLP Visit Diagnosis: Dysphagia, unspecified (R13.10) Plan: MBS       GO                Celedonio Savage, [place], [place] Office: [phone]   09/20/2021, 10:22 AM

## 2021-09-20 NOTE — Progress Notes (Signed)
Occupational Therapy Treatment Patient Details Name: Sherry Baker MRN: [id number] DOB: 12-Apr-1966 Today's Date: 09/20/2021   History of present illness Pt is a 55 y/o female brought to ED after found to be minimally responsive behind a dumpster surrounded by beer cans.  She was hypotensive with tachycardia, agitation and intermittent somnolence.  She was intubated for airway protection and admitted to ICU for complicated EtOH withdrawal.  Initially treated with Precedex which was discontinued due to bradycardia.  She was a started on fentanyl drip, Librium and Ativan as needed.  She also received empiric antibiotics from 11/4-11/8 with Vanco and Unasyn then on Vanco and CTX presumably for respiratory infection. Eventually extubated on 11/10, and transferred to [facility] service on 11/12.  She is on TF via cortrak for dysphagia. PMH including but not limited to EtOH abuse, complicated EtOH withdrawal requiring intubation, cocaine use, tobacco use disorder, CKD-3, HTN, depression, bipolar disorder, vitamin D deficiency, vitamin B12 deficiency, rectal cancer s/p resection, chemo and ostomy.   OT comments  Pt progressing towards acute OT goals. Focus of OT session was bathroom distance mobility, toileting/pericare, and grooming tasks. Pt walked to bathroom to complete toileting tasks with up to mod A +2, unsteady gait pattern. Pt then completed 2 grooming task in standing position with mod A. Pt oriented to person, place, month, and year; followed 1 step commands about 75% of the time.    Recommendations for follow up therapy are one component of a multi-disciplinary discharge planning process, led by the attending physician.  Recommendations may be updated based on patient status, additional functional criteria and insurance authorization.    Follow Up Recommendations  Skilled nursing-short term rehab (<3 hours/day) ; noted difficult to place, likely home at d/c   Assistance Recommended at Discharge Frequent  or constant Supervision/Assistance  Equipment Recommendations  Other (comment) (defer to next venue)    Recommendations for Other Services      Precautions / Restrictions Precautions Precautions: Fall Precaution Comments: NGT, colostomy Restrictions Weight Bearing Restrictions: No       Mobility Bed Mobility Overal bed mobility: Needs Assistance Bed Mobility: Supine to Sit;Sit to Supine     Supine to sit: Min assist;HOB elevated Sit to supine: Min assist;HOB elevated   General bed mobility comments: some decreased inititation and sequencing cues needed. min A to steady and control descent of trunk    Transfers Overall transfer level: Needs assistance Equipment used: 2 person hand held assist Transfers: Sit to/from Stand Sit to Stand: Mod assist;+2 physical assistance;From elevated surface           General transfer comment: to/from EOB and comfort height toilet. Initially +2 mod physcial assist then +1 max A from toilet.     Balance Overall balance assessment: Needs assistance Sitting-balance support: Bilateral upper extremity supported;Feet supported;No upper extremity supported Sitting balance-Leahy Scale: Fair Sitting balance - Comments: sat about 3 minutes EOB   Standing balance support: Bilateral upper extremity supported;During functional activity Standing balance-Leahy Scale: Poor Standing balance comment: Relies on 2 person assist                           ADL either performed or assessed with clinical judgement   ADL Overall ADL's : Needs assistance/impaired     Grooming: Moderate assistance;Standing;Oral care;Wash/dry hands Grooming Details (indicate cue type and reason): pt utilizing sink for external support  Toilet Transfer: +2 for physical assistance;Ambulation;Comfort height toilet;Grab bars;Moderate assistance Toilet Transfer Details (indicate cue type and reason): cues for safety and wider BOS. cues for  pacing mobility. Toileting- Clothing Manipulation and Hygiene: Set up;Supervision/safety;Sitting/lateral lean Toileting - Clothing Manipulation Details (indicate cue type and reason): pericare in lateral lean position     Functional mobility during ADLs: Minimal assistance;+2 for physical assistance (+2 HHA) General ADL Comments: Pt easily agreeable to therapy. Walked to bathroom with +2 HHA, utilized gait belt, unsteady gait pattern. Pt completed pericare and grooming tasks as detailed above.    Extremity/Trunk Assessment Upper Extremity Assessment Upper Extremity Assessment: Difficult to assess due to impaired cognition   Lower Extremity Assessment Lower Extremity Assessment: Defer to PT evaluation        Vision       Perception     Praxis      Cognition Arousal/Alertness: Awake/alert Behavior During Therapy: Flat affect Overall Cognitive Status: Difficult to assess                                 General Comments: Followed one step commands 75% of the time. Intermittent confusion. Oriented to person, place, month and year. Intellectual awareness.          Exercises     Shoulder Instructions       General Comments      Pertinent Vitals/ Pain       Pain Assessment: Faces Faces Pain Scale: Hurts little more Pain Location: rectum Pain Descriptors / Indicators: Grimacing;Restless Pain Intervention(s): Monitored during session;Repositioned;Limited activity within patient's tolerance  Home Living                                          Prior Functioning/Environment              Frequency  Min 2X/week        Progress Toward Goals  OT Goals(current goals can now be found in the care plan section)  Progress towards OT goals: Progressing toward goals  Acute Rehab OT Goals Patient Stated Goal: unable to state OT Goal Formulation: Patient unable to participate in goal setting Time For Goal Achievement:  09/30/21 Potential to Achieve Goals: Fair ADL Goals Pt Will Perform Grooming: with mod assist;sitting Pt Will Perform Upper Body Bathing: with mod assist;sitting Pt Will Perform Lower Body Bathing: with mod assist;sitting/lateral leans Pt Will Perform Upper Body Dressing: with mod assist;sitting Pt Will Perform Lower Body Dressing: with mod assist;sitting/lateral leans Pt Will Transfer to Toilet: with mod assist;stand pivot transfer;bedside commode  Plan Discharge plan remains appropriate    Co-evaluation                 AM-PAC OT "6 Clicks" Daily Activity     Outcome Measure   Help from another person eating meals?: Total Help from another person taking care of personal grooming?: A Lot Help from another person toileting, which includes using toliet, bedpan, or urinal?: Total Help from another person bathing (including washing, rinsing, drying)?: Total Help from another person to put on and taking off regular upper body clothing?: A Lot Help from another person to put on and taking off regular lower body clothing?: Total 6 Click Score: 8    End of Session Equipment Utilized During Treatment: Other (comment) (+2 HHA)  OT Visit Diagnosis:  Muscle weakness (generalized) (M62.81);Other symptoms and signs involving cognitive function;Pain   Activity Tolerance Patient tolerated treatment well;Patient limited by fatigue   Patient Left in bed;with call bell/phone within reach;with bed alarm set   Nurse Communication          Time: 4935-5217 OT Time Calculation (min): 20 min  Charges: OT General Charges $OT Visit: 1 Visit OT Treatments $Self Care/Home Management : 8-22 mins  Tyrone Schimke, OT Acute Rehabilitation Services Pager: [phone] Office: [phone]   Hortencia Pilar 09/20/2021, 12:52 PM

## 2021-09-21 DIAGNOSIS — R5381 Other malaise: Secondary | ICD-10-CM | POA: Diagnosis not present

## 2021-09-21 DIAGNOSIS — I5032 Chronic diastolic (congestive) heart failure: Secondary | ICD-10-CM | POA: Diagnosis not present

## 2021-09-21 DIAGNOSIS — I10 Essential (primary) hypertension: Secondary | ICD-10-CM | POA: Diagnosis not present

## 2021-09-21 DIAGNOSIS — R9431 Abnormal electrocardiogram [ECG] [EKG]: Secondary | ICD-10-CM | POA: Diagnosis not present

## 2021-09-21 LAB — GLUCOSE, CAPILLARY
Glucose-Capillary: 107 mg/dL — ABNORMAL HIGH (ref 70–99)
Glucose-Capillary: 116 mg/dL — ABNORMAL HIGH (ref 70–99)
Glucose-Capillary: 157 mg/dL — ABNORMAL HIGH (ref 70–99)
Glucose-Capillary: 81 mg/dL (ref 70–99)
Glucose-Capillary: 83 mg/dL (ref 70–99)
Glucose-Capillary: 96 mg/dL (ref 70–99)

## 2021-09-21 MED ORDER — NEPRO/CARBSTEADY PO LIQD
237.0000 mL | Freq: Two times a day (BID) | ORAL | Status: DC
  Administered 2021-09-21 – 2021-09-30 (×18): 237 mL via ORAL

## 2021-09-21 MED ORDER — OSMOLITE 1.5 CAL PO LIQD
840.0000 mL | ORAL | Status: DC
  Administered 2021-09-21 – 2021-09-22 (×2): 840 mL
  Filled 2021-09-21: qty 1000
  Filled 2021-09-21: qty 948

## 2021-09-21 MED ORDER — TRAMADOL HCL 50 MG PO TABS
50.0000 mg | ORAL_TABLET | Freq: Two times a day (BID) | ORAL | Status: DC | PRN
  Administered 2021-09-21 (×2): 50 mg via ORAL
  Filled 2021-09-21 (×3): qty 1

## 2021-09-21 MED ORDER — PROSOURCE PLUS PO LIQD
30.0000 mL | Freq: Two times a day (BID) | ORAL | Status: DC
  Administered 2021-09-21 – 2021-09-30 (×18): 30 mL via ORAL
  Filled 2021-09-21 (×18): qty 30

## 2021-09-21 NOTE — Progress Notes (Addendum)
PROGRESS NOTE  Sherry Baker ELT:[id number] DOB: March 01, 1966   PCP: Default, Provider, MD  Patient is from: Home  DOA: 09/05/2021 LOS: 16  Chief complaints:  No chief complaint on file.    Brief Narrative / Interim history: 55 year old F with PMH of EtOH abuse, complicated EtOH withdrawal requiring intubation, cocaine use, tobacco use disorder, CKD-3, HTN, depression, bipolar disorder, vitamin D deficiency, vitamin B12 deficiency, rectal cancer s/p resection, chemo and ostomy brought to ED after found to be minimally responsive behind a dumpster surrounded by beer cans.  She was hypotensive with tachycardia, agitation and intermittent somnolence.  She was intubated for airway protection and admitted to ICU for complicated EtOH withdrawal.  Initially treated with Precedex which was discontinued due to bradycardia.  She was a started on fentanyl drip, Librium and Ativan as needed.  She also received empiric antibiotics from 11/4-11/8 with Vanco and Unasyn then on Vanco and CTX presumably for respiratory infection. Eventually extubated on 11/10, and transferred to [facility] service on 11/12.  Encephalopathy improved.  Dysphagia resolved.  Started on regular diet on 11/18.  Monitoring p.o. intake to ensure adequate caloric intake.  Therapy recommended SNF.  She is on DTP list.   Subjective: Seen and examined earlier this morning.  No major events overnight of this morning.  Complains pain in her backside from coccygeal wound.  Reports eating all her breakfast this morning.  No other complaints.  Oriented x4 except date.  Fair insight.  Objective: Vitals:   09/20/21 1612 09/20/21 2032 09/21/21 0419 09/21/21 0854  BP: 137/65 121/78 132/69 140/81  Pulse: 91 90 89 92  Resp: _0 Temp: 98 F (36.7 C) 98.8 F (37.1 C) 99.1 F (37.3 C) 97.9 F (36.6 C)  TempSrc: Oral Oral Oral Oral  SpO2: 100% 99% 100% 100%  Weight:   46 kg   Height:        Intake/Output Summary (Last 24 hours) at  09/21/2021 1200 Last data filed at 09/20/2021 1300 Gross per 24 hour  Intake 0 ml  Output --  Net 0 ml   Filed Weights   09/19/21 0622 09/20/21 0449 09/21/21 0419  Weight: 45.8 kg 45.6 kg 46 kg    Examination:  GENERAL: Frail looking elderly female.  No apparent distress. HEENT: MMM.  Vision and hearing grossly intact.  NECK: Supple.  No apparent JVD.  RESP: On RA.  No IWOB.  Fair aeration bilaterally. CVS:  RRR. Heart sounds normal.  ABD/GI/GU: BS+. Abd soft, NTND.  MSK/EXT:  Moves extremities. No apparent deformity. No edema.  SKIN: Stage IV coccygeal pressure ulcer.  See picture under the media NEURO: Awake and alert. Oriented x4 except date.  Fair insight.  No apparent focal neuro deficit. PSYCH: Calm. Normal affect.   Procedures:  11/3-11/10-intubation and mechanical ventilation.  Microbiology summarized: 11/3-COVID-19, influenza PCR and full RVP negative 11/3-MRSA PCR positive 11/4-respiratory culture with moderate Streptococcus pneumonia sensitive to CTX 11/4-blood cultures negative.  Assessment & Plan: Acute metabolic encephalopathy-likely related to alcohol.  CTH, ammonia, TSH, B12 and RPR unrevealing. No focal neuro deficit to suggest CVA but limited exam.  Encephalopathy resolving. -Completed course of high-dose thiamine. -Atarax 10 mg 3 times daily with as needed Ativan for anxiety/agitation. -Passed voiding trial on 11/13. -Passed swallowing eval on 11/18-now on regular solids; thin liquids -Monitor p.o. intake  Complicated alcohol withdrawal requiring intubation and mechanical ventilation Cocaine use disorder -Completed Librium taper and high-dose thiamine as above. -Continue multivitamins, folic acid and thiamine. -  TOC consulted for counseling or resources  Dysphagia: Likely from intubation and encephalopathy.  Resolved. -Now on regular diet -Monitor p.o. intake before taking out cortrak -Consulted dietitian to adjust tube feed for p.o.  intake  Abnormal EKG with ST changes Mildly elevated troponin, 24> 54>> 66-likely demand ischemia. Chronic diastolic CHF-TTE on 11/9 with LVEF of 55 to 60%, G1 DD, no RWMA Uncontrolled hypertension: BP within acceptable range. -Increased Coreg to 9.375 mg twice daily on 11/18. -Continue amlodipine 10 mg daily -P.o. hydralazine as needed   Tobacco use disorder -Encourage cessation when able to comprehend -Nicotine patch   AKI on CKD-3A/azotemia: Likely prerenal from n.p.o. improved. Recent Labs    09/08/21 0313 09/09/21 0638 09/10/21 0430 09/11/21 0228 09/12/21 0223 09/14/21 0818 09/15/21 0600 09/18/21 0128 09/19/21 0459 09/20/21 0418  BUN _0 24* 49* 58* 52*  CREATININE 1.27* 1.48* 1.39* 1.55* 1.28* 1.35* 1.35* 1.48* 1.61* 1.45*  -Monitor intermittently.  History of vitamin B12 deficiency-B12-473 now History of vitamin D deficiency-vitamin D slightly low at 21.16. -Vitamin D 800 units daily  History of rectal cancer s/p resection w/ostomy -Routine ostomy care   GERD -Continue Protonix   Anxiety/depression/bipolar 1 disorder: Not able to assess this given her mental status. -Continue BuSpar and Celexa -Continue Atarax with as needed Ativan   RUE swelling/superficial thrombophlebitis -Axillary line was discontinued  Physical deconditioning due to acute illness and chronic alcohol abuse -PT/OT-recommends SNF.  Neck/back pain -Continue Tylenol  -Added tramadol. -Avoid opiates given substance abuse/cocaine use history.  Prolonged QT: Resolved on repeat EKG. -Optimize K and Mg  Severe malnutrition: as evidenced by low BMI and significant muscle mass and subcu fat loss. Body mass index is 18.55 kg/m. Nutrition Problem: Severe Malnutrition Etiology: chronic illness (cirrhosis, polysubstance abuse) Signs/Symptoms: severe muscle depletion, severe fat depletion Interventions: MVI, Tube feeding, Prostat  Pressure skin injury: POA.  Looks like stage  IV.  No signs of infection.  No drainage. Pressure Injury 09/05/21 Coccyx Mid Stage 3 -  Full thickness tissue loss. Subcutaneous fat may be visible but bone, tendon or muscle are NOT exposed. dehissed surgical wound from 2018; NOT pressure (Active)  09/05/21 2300  Location: Coccyx  Location Orientation: Mid  Staging: Stage 3 -  Full thickness tissue loss. Subcutaneous fat may be visible but bone, tendon or muscle are NOT exposed.  Wound Description (Comments): dehissed surgical wound from 2018; NOT pressure  Present on Admission: Yes  -Continue local wound care  DVT prophylaxis:  heparin injection 5,000 Units Start: 09/06/21 0600 SCDs Start: 09/05/21 2126  Code Status: Full code Family Communication: Updated patient's cousin over the phone.  Level of care: Med-Surg  Status is: Inpatient  Remains inpatient appropriate because: Monitor oral intake to wean off tube feed and safe disposition   Final disposition: Therapy recommends SNF but difficult to place with cocaine and alcohol use.     Consultants:  Pulmonology   Sch Meds:  Scheduled Meds:  amLODipine  10 mg Oral Daily   busPIRone  15 mg Oral BID   carvedilol  9.375 mg Oral BID WC   Chlorhexidine Gluconate Cloth  6 each Topical Daily   cholecalciferol  1,000 Units Oral Daily   citalopram  10 mg Oral Daily   diclofenac Sodium  4 g Topical QID   docusate sodium  100 mg Oral Daily   feeding supplement (PROSource TF)  45 mL Per Tube BID   folic acid  1 mg Oral Daily   free  water  150 mL Per Tube Q4H   heparin  5,000 Units Subcutaneous Q8H   hydrOXYzine  10 mg Oral TID   lidocaine  1 patch Transdermal Q24H   mouth rinse  15 mL Mouth Rinse BID   multivitamin with minerals  1 tablet Oral Daily   nicotine  14 mg Transdermal Daily   pantoprazole  40 mg Oral Daily   polyethylene glycol  17 g Oral Daily   sevelamer carbonate  800 mg Oral TID WC   sodium chloride flush  10-40 mL Intracatheter Q12H   thiamine  100 mg Oral  Daily   Continuous Infusions:  feeding supplement (OSMOLITE 1.5 CAL) 1,000 mL (09/20/21 1431)   PRN Meds:.acetaminophen, labetalol, LORazepam, phenol, sodium chloride flush, traMADol  Antimicrobials: Anti-infectives (From admission, onward)    Start     Dose/Rate Route Frequency Ordered Stop   09/08/21 0830  cefTRIAXone (ROCEPHIN) 2 g in sodium chloride 0.9 % 100 mL IVPB       Note to Pharmacy: Tolerated unasyn   2 g 200 mL/hr over 30 Minutes Intravenous Every 24 hours 09/08/21 0731 09/10/21 0832   09/07/21 1000  vancomycin (VANCOREADY) IVPB 500 mg/100 mL  Status:  Discontinued        500 mg 100 mL/hr over 60 Minutes Intravenous Every 24 hours 09/06/21 1452 09/07/21 1037   09/06/21 1633  Ampicillin-Sulbactam (UNASYN) 3 g in sodium chloride 0.9 % 100 mL IVPB  Status:  Discontinued        3 g 200 mL/hr over 30 Minutes Intravenous Every 8 hours 09/06/21 1258 09/07/21 1037   09/06/21 1545  vancomycin (VANCOREADY) IVPB 1000 mg/200 mL        1,000 mg 200 mL/hr over 60 Minutes Intravenous  Once 09/06/21 1452 09/06/21 1629   09/06/21 0915  Ampicillin-Sulbactam (UNASYN) 3 g in sodium chloride 0.9 % 100 mL IVPB  Status:  Discontinued        3 g 200 mL/hr over 30 Minutes Intravenous Every 12 hours 09/06/21 0817 09/06/21 1258        I have personally reviewed the following labs and images: CBC: Recent Labs  Lab 09/15/21 0600 09/18/21 0128 09/19/21 0459  WBC 6.2 6.8 6.2  HGB 11.4* 10.5* 10.2*  HCT 35.4* 33.2* 32.7*  MCV 89.4 90.2 90.8  PLT 358 396 396   BMP &GFR Recent Labs  Lab 09/15/21 0600 09/15/21 1917 09/16/21 0641 09/18/21 0128 09/19/21 0459 09/20/21 0418  NA 137  --   --  136 133* 132*  K 4.4  --   --  4.7 5.1 4.7  CL 107  --   --  105 103 100  CO2 22  --   --  20* 23 23  GLUCOSE 115*  --   --  78 109* 96  BUN 24*  --   --  49* 58* 52*  CREATININE 1.35*  --   --  1.48* 1.61* 1.45*  CALCIUM 9.1  --   --  9.4 9.3 9.8  MG 2.4 2.4 2.4 2.4 2.6* 2.3  PHOS 4.2  4.2  4.4 4.7* 6.2* 6.0* 5.6*   Estimated Creatinine Clearance: 32.6 mL/min (A) (by C-G formula based on SCr of 1.45 mg/dL (H)). Liver & Pancreas: Recent Labs  Lab 09/15/21 0600 09/18/21 0128 09/19/21 0459 09/20/21 0418  AST  --   --  50* 42*  ALT  --   --  62* 57*  ALKPHOS  --   --  75 69  BILITOT  --   --  0.4 0.2*  PROT  --   --  7.8 7.5  ALBUMIN 2.4* 2.7* 2.8* 2.8*  2.8*   No results for input(s): LIPASE, AMYLASE in the last 168 hours. No results for input(s): AMMONIA in the last 168 hours.  Diabetic: No results for input(s): HGBA1C in the last 72 hours. Recent Labs  Lab 09/20/21 2029 09/21/21 0028 09/21/21 0415 09/21/21 0852 09/21/21 1130  GLUCAP 111* 107* 83 116* 157*   Cardiac Enzymes: Recent Labs  Lab 09/15/21 0600  CKTOTAL 23*   No results for input(s): PROBNP in the last 8760 hours. Coagulation Profile: No results for input(s): INR, PROTIME in the last 168 hours. Thyroid Function Tests: No results for input(s): TSH, T4TOTAL, FREET4, T3FREE, THYROIDAB in the last 72 hours.  Lipid Profile: No results for input(s): CHOL, HDL, LDLCALC, TRIG, CHOLHDL, LDLDIRECT in the last 72 hours. Anemia Panel: No results for input(s): VITAMINB12, FOLATE, FERRITIN, TIBC, IRON, RETICCTPCT in the last 72 hours.  Urine analysis:    Component Value Date/Time   COLORURINE STRAW (A) 09/05/2021 1638   APPEARANCEUR CLEAR 09/05/2021 1638   LABSPEC 1.005 09/05/2021 1638   PHURINE 6.0 09/05/2021 1638   GLUCOSEU NEGATIVE 09/05/2021 1638   HGBUR SMALL (A) 09/05/2021 1638   BILIRUBINUR NEGATIVE 09/05/2021 1638   KETONESUR NEGATIVE 09/05/2021 1638   PROTEINUR 100 (A) 09/05/2021 1638   UROBILINOGEN 0.2 12/23/2017 1417   NITRITE NEGATIVE 09/05/2021 1638   LEUKOCYTESUR NEGATIVE 09/05/2021 1638   Sepsis Labs: Invalid input(s): PROCALCITONIN, [place]  Microbiology: No results found for this or any previous visit (from the past 240 hour(s)).   Radiology Studies: DG Swallowing  Func-Speech Pathology  Result Date: 09/20/2021 Table formatting from the original result was not included. Objective Swallowing Evaluation: Type of Study: MBS-Modified Barium Swallow Study  Patient Details Name: Sherry Baker MRN: [id number] Date of Birth: August 17, 1966 Today's Date: 09/20/2021 Time: SLP Start Time (ACUTE ONLY): 1335 -SLP Stop Time (ACUTE ONLY): 1350 SLP Time Calculation (min) (ACUTE ONLY): 15 min Past Medical History: Past Medical History: Diagnosis Date  Alcohol abuse   Allergy   PCNS swelling  Arthritis   Bipolar 1 disorder ([place])   Cancer ([place]) 01/21/2017  rectal cancer  Cancer ([place])   Cirrhosis of liver ([place])   Depression   Genetic testing 03/24/2017  Ms. Bobst underwent genetic counseling and testing for hereditary cancer syndromes on 02/17/2017. Her results were negative for mutations in all 46 genes analyzed by [facility]'s 46-gene Common Hereditary Cancers Panel. Genes analyzed include: APC, ATM, AXIN2, BARD1, BMPR1A, BRCA1, BRCA2, BRIP1, CDH1, CDKN2A, CHEK2, CTNNA1, DICER1, EPCAM, GREM1, HOXB13, KIT, MEN1, MLH1, MSH2, MSH3, MSH6, MUTYH, NBN,  Hypertension  Past Surgical History: Past Surgical History: Procedure Laterality Date  ABDOMINAL PERINEAL BOWEL RESECTION N/A 06/18/2017  Procedure: ABDOMINAL PERINEAL RESECTION ERAS PATHWAY;  Surgeon: Leighton Ruff, MD;  Location: [facility];  Service: General;  Laterality: N/A;  COLON SURGERY    COLONOSCOPY WITH PROPOFOL Left 01/21/2017  Procedure: COLONOSCOPY WITH PROPOFOL;  Surgeon: Otis Brace, MD;  Location: [facility];  Service: Gastroenterology;  Laterality: Left;  FRACTURE SURGERY    MANDIBLE FRACTURE SURGERY   HPI: 55 yo F admitted to the hospital for cocaine intoxication and etoh withdrawal. In the ED the pt was exhibiting hypertension, tachycardia and reportedly intermittent agitation so received 80m of geodon and 283mof ativan. Pt was intermittently somnolent prior to and after the meds in the ED. PCCM consulted for evaluation and pt was  found to not be able to protect her airway, subsequently was intubated and admitted. CXR 11/7 concerning for BLL opacities. Pt with ETT 11/3-11/10. Pt is known to this service from prior admissions.  She was unable to participate in assessment in May of this year.  In August 2020 pt was seen at bedside with recommendations for mechanical soft solids and thin liquid.  Pt with prior medical history of ETOH use disorder, complicated etoh withdrawals requiring prior intubation; Cocaine abuse; HTN; CKD3; GERD; Bipolar 1; Depression; Tobacco dependence; Rectal cancer s/p resection, chemo, and s/p ostomy; BSE completed 09/13/21 with recommendation for NPO with potential temporary non-oral feeding; Cortrak placed on 09/13/21.  SLE f/u for PO readiness.  Subjective: alert, cooperative, eager to eat and drink  Recommendations for follow up therapy are one component of a multi-disciplinary discharge planning process, led by the attending physician.  Recommendations may be updated based on patient status, additional functional criteria and insurance authorization. Assessment / Plan / Recommendation Clinical Impressions 09/20/2021 Clinical Impression Pt has piecemeal swallowing with thin liquids with swallow initiated consistently at the pyriform sinuses. She maintains good airway protection with trace penetration that occurs during the swallow with consecutive straw sips (PAS #2). This is still functional given that penetrates clear spontaneously, but could perhaps be improved further by removal of cortrak too as the epiglottis does appear to make contact with it, perhaps not inverting to its full potential. Oropharyngeal swallow is [facility] with purees and solids with adequate mastication even with reduced dentition. Recommend regular solids and thin liquids. Pt was educated on use of general aspiration precautions as well. Considering that her mentation has been fluctuating and positioning was reduced at bedside, recommend brief  f/u for safety without longer term needs anticipated.  SLP Visit Diagnosis Dysphagia, unspecified (R13.10) Attention and concentration deficit following -- Frontal lobe and executive function deficit following -- Impact on safety and function Mild aspiration risk   Treatment Recommendations 09/20/2021 Treatment Recommendations Therapy as outlined in treatment plan below   Prognosis 09/20/2021 Prognosis for Safe Diet Advancement Good Barriers to Reach Goals Cognitive deficits Barriers/Prognosis Comment -- Diet Recommendations 09/20/2021 SLP Diet Recommendations Regular solids;Thin liquid Liquid Administration via Cup;Straw Medication Administration Whole meds with liquid Compensations Slow rate;Small sips/bites;Minimize environmental distractions Postural Changes Seated upright at 90 degrees   Other Recommendations 09/20/2021 Recommended Consults -- Oral Care Recommendations Oral care BID Other Recommendations -- Follow Up Recommendations No SLP follow up Assistance recommended at discharge Set up Supervision/Assistance Functional Status Assessment Patient has had a recent decline in their functional status and demonstrates the ability to make significant improvements in function in a reasonable and predictable amount of time. Frequency and Duration  09/20/2021 Speech Therapy Frequency (ACUTE ONLY) min 1 x/week Treatment Duration 1 week   Oral Phase 09/20/2021 Oral Phase Impaired Oral - Pudding Teaspoon -- Oral - Pudding Cup -- Oral - Honey Teaspoon -- Oral - Honey Cup -- Oral - Nectar Teaspoon -- Oral - Nectar Cup -- Oral - Nectar Straw -- Oral - Thin Teaspoon -- Oral - Thin Cup Piecemeal swallowing Oral - Thin Straw Piecemeal swallowing Oral - Puree WFL Oral - Mech Soft -- Oral - Regular WFL Oral - Multi-Consistency -- Oral - Pill WFL Oral Phase - Comment --  Pharyngeal Phase 09/20/2021 Pharyngeal Phase Impaired Pharyngeal- Pudding Teaspoon -- Pharyngeal -- Pharyngeal- Pudding Cup -- Pharyngeal -- Pharyngeal- Honey  Teaspoon -- Pharyngeal -- Pharyngeal- Honey Cup -- Pharyngeal -- Pharyngeal- Nectar Teaspoon -- Pharyngeal -- Pharyngeal- Nectar  Cup -- Pharyngeal -- Pharyngeal- Nectar Straw -- Pharyngeal -- Pharyngeal- Thin Teaspoon -- Pharyngeal -- Pharyngeal- Thin Cup WFL Pharyngeal -- Pharyngeal- Thin Straw Penetration/Aspiration during swallow;Penetration/Aspiration before swallow;Reduced airway/laryngeal closure Pharyngeal Material enters airway, remains ABOVE vocal cords then ejected out Pharyngeal- Puree WFL Pharyngeal -- Pharyngeal- Mechanical Soft -- Pharyngeal -- Pharyngeal- Regular WFL Pharyngeal -- Pharyngeal- Multi-consistency -- Pharyngeal -- Pharyngeal- Pill WFL Pharyngeal -- Pharyngeal Comment --  Cervical Esophageal Phase  09/20/2021 Cervical Esophageal Phase WFL Pudding Teaspoon -- Pudding Cup -- Honey Teaspoon -- Honey Cup -- Nectar Teaspoon -- Nectar Cup -- Nectar Straw -- Thin Teaspoon -- Thin Cup -- Thin Straw -- Puree -- Mechanical Soft -- Regular -- Multi-consistency -- Pill -- Cervical Esophageal Comment -- Osie Bond., M.A. [facility] Pager [phone] Office [phone] 09/20/2021, 2:38 PM                       Classie Weng T. Cool  If 7PM-7AM, please contact night-coverage [email or web address] 09/21/2021, 12:00 PM

## 2021-09-21 NOTE — Progress Notes (Addendum)
Nutrition Follow-up  DOCUMENTATION CODES:   Severe malnutrition in context of chronic illness  INTERVENTION:  -Initiate calorie count (RD to follow-up with results on Monday, 11/21) -Nepro Shake po BID, each supplement provides 425 kcal and 19 grams protein -d/c Prosource TF -Transition to PROSource PLUS PO 59mls BID, each supplement provides 100 kcals and 15 grams of protein -Transition to nocturnal TF via Cortrak: Osmolite 1.5 @ 59ml/hr x12 hours (1800-0600) Provides 1260 kcals, 52 grams protein, 672ml free water (meets ~79% and 65% of minimum estimated calorie and protein needs, respectively)  -146ml free water Q4H (total free water: 1570mls)  NUTRITION DIAGNOSIS:   Severe Malnutrition related to chronic illness (cirrhosis, polysubstance abuse) as evidenced by severe muscle depletion, severe fat depletion.  ongoing  GOAL:   Patient will meet greater than or equal to 90% of their needs  Addressing with TF and supplements  MONITOR:   TF tolerance, Diet advancement, Labs, Skin  REASON FOR ASSESSMENT:   Consult Calorie Count  ASSESSMENT:   55 year old female who presented to the ED on 11/03 with AMS. PMH of EtOH abuse, cirrhosis, complicated EtOH withdrawals requiring prior intubation, cocaine abuse, HTN, CKD stage III, GERD, rectal cancer s/p resection with colostomy, bipolar 1 disorder, depression, tobacco abuse. Pt admitted with cocaine intoxication and EtOH withdrawal and required intubation for airway protection.  11/09: TF d/c  11/10: extubated; OG removed 11/11: transferred to the floor; Cortrak placed (gastric/D1); TF started  11/18: initiated on regular diet s/p swallow eval  RD consulted to initiate calorie count in order to wean of TF.   Pt c/o pain from wound on coccyx. Reports good appetite and states that she ate 100% of her breakfast; however, RN documented pt ate 0% breakfast and 50% lunch. All other meals documented as 0% as well. RD will adjust TF to  nocturnal to stimulate appetite and order calorie count to monitor adequacy of PO intake.   Current TF: Osmolite 1.5 @ 37ml/hr, 25ml Prosource TF BID, 160ml free water Q4H  UOP: 3x unmeasured occurrences x24 hours Colostomy output: 59ml x24 hours I/O: -945ml since admit  Current weight: 46 kg Admit weight: 46.3 kg   Medications: Cholecalciferol, Colace, Folic Acid, MVI, Protonix, Miralax, Renvela, Thiamine Labs: Recent Labs  Lab 09/18/21 0128 09/19/21 0459 09/20/21 0418  NA 136 133* 132*  K 4.7 5.1 4.7  CL 105 103 100  CO2 20* 23 23  BUN 49* 58* 52*  CREATININE 1.48* 1.61* 1.45*  CALCIUM 9.4 9.3 9.8  MG 2.4 2.6* 2.3  PHOS 6.2* 6.0* 5.6*  GLUCOSE 78 109* 96  CBGs: 83-157 x24 hours   Diet Order:   Diet Order             Diet regular Room service appropriate? Yes; Fluid consistency: Thin  Diet effective now                   EDUCATION NEEDS:   No education needs have been identified at this time  Skin:  Skin Assessment: Skin Integrity Issues: Skin Integrity Issues:: Stage III, Other (Comment) Stage III: coccyx Other: skin tear L arm  Last BM:  11/18 25ml via colostomy  Height:   Ht Readings from Last 1 Encounters:  09/05/21 5\' 2"  (1.575 m)    Weight:   Wt Readings from Last 1 Encounters:  09/21/21 46 kg    Ideal Body Weight:  50 kg  BMI:  Body mass index is 18.55 kg/m.  Estimated Nutritional Needs:   Kcal:  1600-1800  Protein:  80-95g  Fluid:  >1.6L     [name]., MS, RD, LDN (she/her/hers) RD pager number and weekend/on-call pager number located in [place].

## 2021-09-22 DIAGNOSIS — F10931 Alcohol use, unspecified with withdrawal delirium: Secondary | ICD-10-CM | POA: Diagnosis not present

## 2021-09-22 LAB — GLUCOSE, CAPILLARY
Glucose-Capillary: 94 mg/dL (ref 70–99)
Glucose-Capillary: 105 mg/dL — ABNORMAL HIGH (ref 70–99)
Glucose-Capillary: 111 mg/dL — ABNORMAL HIGH (ref 70–99)
Glucose-Capillary: 85 mg/dL (ref 70–99)
Glucose-Capillary: 77 mg/dL (ref 70–99)

## 2021-09-22 MED ORDER — OXYCODONE-ACETAMINOPHEN 5-325 MG PO TABS
1.0000 | ORAL_TABLET | Freq: Once | ORAL | Status: AC
  Administered 2021-09-22: 1 via ORAL
  Filled 2021-09-22: qty 1

## 2021-09-22 MED ORDER — TRAMADOL HCL 50 MG PO TABS
50.0000 mg | ORAL_TABLET | Freq: Four times a day (QID) | ORAL | Status: DC | PRN
  Administered 2021-09-22 – 2021-09-30 (×10): 50 mg via ORAL
  Filled 2021-09-22 (×10): qty 1

## 2021-09-22 MED ORDER — TRAMADOL HCL 50 MG PO TABS
50.0000 mg | ORAL_TABLET | Freq: Once | ORAL | Status: AC
  Administered 2021-09-22: 50 mg via ORAL

## 2021-09-22 NOTE — Progress Notes (Signed)
Report received and care assumed from previous shift RN. VS obtained, shift assessments completed - see flowsheets. PRN pain medications given for c/o sacral pain - see MAR. Patient able to turn and reposition self in bed. Dressing to sacrum remains CDI. L nare NGT intact, tube feedings infusing as ordered. OOB with one assist to bathroom to void, tolerating activity well with unsteady gait. Patient currently resting in bed, bed in lowest position. Denies needs. Call bell within reach. Bedalarm in use at all times.

## 2021-09-22 NOTE — Progress Notes (Signed)
PROGRESS NOTE    [name]  YKZ:[id number] DOB: 1965-12-08 DOA: 09/05/2021 PCP: Default, Provider, MD     Brief Narrative:  Sherry Baker is a 55 year old F with PMH of EtOH abuse, complicated EtOH withdrawal requiring intubation, cocaine use, tobacco use disorder, CKD-3, HTN, depression, bipolar disorder, vitamin D deficiency, vitamin B12 deficiency, rectal cancer s/p resection, chemo and ostomy brought to ED after found to be minimally responsive behind a dumpster surrounded by beer cans.  She was hypotensive with tachycardia, agitation and intermittent somnolence.  She was intubated for airway protection and admitted to ICU for complicated EtOH withdrawal.  Initially treated with Precedex which was discontinued due to bradycardia.  She was a started on fentanyl drip, Librium and Ativan as needed.  She also received empiric antibiotics from 11/4-11/8 with Vanco and Unasyn then on Vanco and CTX presumably for respiratory infection. Eventually extubated on 11/10, and transferred to [facility] service on 11/12.  Encephalopathy improved.  Dysphagia resolved.  Started on regular diet on 11/18.  Monitoring p.o. intake to ensure adequate caloric intake.  Therapy recommended SNF.  She is on DTP list.   New events last 24 hours / Subjective: Patient has not eaten her breakfast.  She is still receiving tube feeds through cortrak at night.  Assessment & Plan:   Active Problems:   Substance abuse (HCC)   Depression   Severe malnutrition (HCC)   Alcohol abuse   Tobacco abuse   Cocaine abuse ([place])   Bipolar affective disorder (HCC)   Rectal cancer ([place])   Pressure injury of skin   Alcohol withdrawal ([place])   Acute metabolic encephalopathy-likely related to alcohol.  CTH, ammonia, TSH, B12 and RPR unrevealing. No focal neuro deficit to suggest CVA but limited exam.  Encephalopathy resolving. -Completed course of high-dose thiamine.   Complicated alcohol withdrawal requiring intubation and mechanical  ventilation Cocaine use disorder -Completed Librium taper and high-dose thiamine as above. -Continue multivitamins, folic acid and thiamine. -TOC consulted for counseling or resources   Dysphagia: Likely from intubation and encephalopathy.  Resolved. -Now on regular diet -Monitor p.o. intake before taking out cortrak -Consulted dietitian to adjust tube feed for p.o. intake   Abnormal EKG with ST changes Mildly elevated troponin, 24>54>66 secondary to demand ischemia Chronic diastolic CHF-TTE on 93/9 with LVEF of 55 to 60%, G1 DD, no RWMA Essential hypertension -Continue Coreg, amlodipine   Tobacco use disorder -Encourage cessation  -Nicotine patch   AKI on CKD 3a  -Improved   Vitamin D deficiency -Replace   History of rectal cancer s/p resection w/ostomy -Routine ostomy care   GERD -Continue Protonix   Anxiety/depression/bipolar 1 disorder -Continue BuSpar and Celexa, Atarax, as needed Ativan   RUE swelling/superficial thrombophlebitis -Axillary line was discontinued   Physical deconditioning due to acute illness and chronic alcohol abuse -PT/OT-recommends SNF    Prolonged QT: Resolved on repeat EKG.   Severe malnutrition: as evidenced by low BMI and significant muscle mass and subcu fat loss. Body mass index is 18.55 kg/m. Nutrition Problem: Severe Malnutrition Etiology: chronic illness (cirrhosis, polysubstance abuse) Signs/Symptoms: severe muscle depletion, severe fat depletion Interventions: MVI, Tube feeding, Prostat  DVT prophylaxis:  heparin injection 5,000 Units Start: 09/06/21 0600 SCDs Start: 09/05/21 2126  Code Status: Full code Family Communication: No family at bedside Disposition Plan:  Status is: Inpatient  Remains inpatient appropriate because: Awaiting SNF placement, remains on cortrak  Antimicrobials:  Anti-infectives (From admission, onward)    Start     Dose/Rate Route  Frequency Ordered Stop   09/08/21 0830  cefTRIAXone (ROCEPHIN)  2 g in sodium chloride 0.9 % 100 mL IVPB       Note to Pharmacy: Tolerated unasyn   2 g 200 mL/hr over 30 Minutes Intravenous Every 24 hours 09/08/21 0731 09/10/21 0832   09/07/21 1000  vancomycin (VANCOREADY) IVPB 500 mg/100 mL  Status:  Discontinued        500 mg 100 mL/hr over 60 Minutes Intravenous Every 24 hours 09/06/21 1452 09/07/21 1037   09/06/21 1633  Ampicillin-Sulbactam (UNASYN) 3 g in sodium chloride 0.9 % 100 mL IVPB  Status:  Discontinued        3 g 200 mL/hr over 30 Minutes Intravenous Every 8 hours 09/06/21 1258 09/07/21 1037   09/06/21 1545  vancomycin (VANCOREADY) IVPB 1000 mg/200 mL        1,000 mg 200 mL/hr over 60 Minutes Intravenous  Once 09/06/21 1452 09/06/21 1629   09/06/21 0915  Ampicillin-Sulbactam (UNASYN) 3 g in sodium chloride 0.9 % 100 mL IVPB  Status:  Discontinued        3 g 200 mL/hr over 30 Minutes Intravenous Every 12 hours 09/06/21 0817 09/06/21 1258        Objective: Vitals:   09/21/21 1638 09/21/21 2002 09/22/21 0325 09/22/21 0806  BP: 126/77 117/83 (!) 142/85 129/79  Pulse: 92 89 90 91  Resp: 16 17 18 16   Temp: 98.7 F (37.1 C) 98.8 F (37.1 C) 97.9 F (36.6 C) 98.4 F (36.9 C)  TempSrc: Oral Oral Oral Oral  SpO2: 98% 98% 98% 99%  Weight:   46.8 kg   Height:        Intake/Output Summary (Last 24 hours) at 09/22/2021 1406 Last data filed at 09/22/2021 0900 Gross per 24 hour  Intake 1157 ml  Output --  Net 1157 ml   Filed Weights   09/20/21 0449 09/21/21 0419 09/22/21 0325  Weight: 45.6 kg 46 kg 46.8 kg    Examination:  General exam: Appears calm and comfortable  Respiratory system: Clear to auscultation. Respiratory effort normal. No respiratory distress. No conversational dyspnea.  Cardiovascular system: S1 & S2 heard, RRR. No murmurs. No pedal edema. Gastrointestinal system: Abdomen is nondistended, soft and nontender. Normal bowel sounds heard. Central nervous system: Alert and oriented. No focal neurological deficits.  Speech clear.  Extremities: Symmetric in appearance  Skin: No rashes, lesions or ulcers on exposed skin  Psychiatry: Judgement and insight appear normal. Mood & affect appropriate.   Data Reviewed: I have personally reviewed following labs and imaging studies  CBC: Recent Labs  Lab 09/18/21 0128 09/19/21 0459  WBC 6.8 6.2  HGB 10.5* 10.2*  HCT 33.2* 32.7*  MCV 90.2 90.8  PLT 396 355   Basic Metabolic Panel: Recent Labs  Lab 09/15/21 1917 09/16/21 0641 09/18/21 0128 09/19/21 0459 09/20/21 0418  NA  --   --  136 133* 132*  K  --   --  4.7 5.1 4.7  CL  --   --  105 103 100  CO2  --   --  20* 23 23  GLUCOSE  --   --  78 109* 96  BUN  --   --  49* 58* 52*  CREATININE  --   --  1.48* 1.61* 1.45*  CALCIUM  --   --  9.4 9.3 9.8  MG 2.4 2.4 2.4 2.6* 2.3  PHOS 4.4 4.7* 6.2* 6.0* 5.6*   GFR: Estimated Creatinine Clearance: 33.2 mL/min (A) (  by C-G formula based on SCr of 1.45 mg/dL (H)). Liver Function Tests: Recent Labs  Lab 09/18/21 0128 09/19/21 0459 09/20/21 0418  AST  --  50* 42*  ALT  --  62* 57*  ALKPHOS  --  75 69  BILITOT  --  0.4 0.2*  PROT  --  7.8 7.5  ALBUMIN 2.7* 2.8* 2.8*  2.8*   No results for input(s): LIPASE, AMYLASE in the last 168 hours. No results for input(s): AMMONIA in the last 168 hours. Coagulation Profile: No results for input(s): INR, PROTIME in the last 168 hours. Cardiac Enzymes: No results for input(s): CKTOTAL, CKMB, CKMBINDEX, TROPONINI in the last 168 hours. BNP (last 3 results) No results for input(s): PROBNP in the last 8760 hours. HbA1C: No results for input(s): HGBA1C in the last 72 hours. CBG: Recent Labs  Lab 09/21/21 1633 09/21/21 2000 09/22/21 0424 09/22/21 0804 09/22/21 1155  GLUCAP 81 96 94 105* 111*   Lipid Profile: No results for input(s): CHOL, HDL, LDLCALC, TRIG, CHOLHDL, LDLDIRECT in the last 72 hours. Thyroid Function Tests: No results for input(s): TSH, T4TOTAL, FREET4, T3FREE, THYROIDAB in the last 72  hours. Anemia Panel: No results for input(s): VITAMINB12, FOLATE, FERRITIN, TIBC, IRON, RETICCTPCT in the last 72 hours. Sepsis Labs: Recent Labs  Lab 09/16/21 0641  PROCALCITON <0.10    No results found for this or any previous visit (from the past 240 hour(s)).    Radiology Studies: No results found.    Scheduled Meds:  (feeding supplement) PROSource Plus  30 mL Oral BID BM   amLODipine  10 mg Oral Daily   busPIRone  15 mg Oral BID   carvedilol  9.375 mg Oral BID WC   Chlorhexidine Gluconate Cloth  6 each Topical Daily   cholecalciferol  1,000 Units Oral Daily   citalopram  10 mg Oral Daily   diclofenac Sodium  4 g Topical QID   docusate sodium  100 mg Oral Daily   feeding supplement (NEPRO CARB STEADY)  237 mL Oral BID BM   feeding supplement (OSMOLITE 1.5 CAL)  840 mL Per Tube D97C   folic acid  1 mg Oral Daily   free water  150 mL Per Tube Q4H   heparin  5,000 Units Subcutaneous Q8H   hydrOXYzine  10 mg Oral TID   lidocaine  1 patch Transdermal Q24H   mouth rinse  15 mL Mouth Rinse BID   multivitamin with minerals  1 tablet Oral Daily   nicotine  14 mg Transdermal Daily   pantoprazole  40 mg Oral Daily   polyethylene glycol  17 g Oral Daily   sevelamer carbonate  800 mg Oral TID WC   sodium chloride flush  10-40 mL Intracatheter Q12H   thiamine  100 mg Oral Daily   Continuous Infusions:   LOS: 17 days      Time spent: 25 minutes   Dessa Phi, DO [facility] 09/22/2021, 2:06 PM   Available via Epic secure chat 7am-7pm After these hours, please refer to coverage provider listed on [email or web address]

## 2021-09-22 NOTE — Plan of Care (Signed)

## 2021-09-23 DIAGNOSIS — F10921 Alcohol use, unspecified with intoxication delirium: Secondary | ICD-10-CM | POA: Diagnosis not present

## 2021-09-23 DIAGNOSIS — F1410 Cocaine abuse, uncomplicated: Secondary | ICD-10-CM | POA: Diagnosis not present

## 2021-09-23 DIAGNOSIS — F10939 Alcohol use, unspecified with withdrawal, unspecified: Secondary | ICD-10-CM | POA: Diagnosis not present

## 2021-09-23 DIAGNOSIS — E43 Unspecified severe protein-calorie malnutrition: Secondary | ICD-10-CM

## 2021-09-23 DIAGNOSIS — Z5900 Homelessness unspecified: Secondary | ICD-10-CM

## 2021-09-23 LAB — GLUCOSE, CAPILLARY
Glucose-Capillary: 87 mg/dL (ref 70–99)
Glucose-Capillary: 88 mg/dL (ref 70–99)
Glucose-Capillary: 89 mg/dL (ref 70–99)
Glucose-Capillary: 96 mg/dL (ref 70–99)

## 2021-09-23 MED ORDER — ONDANSETRON HCL 4 MG/2ML IJ SOLN: 4.0000 mg | Freq: Four times a day (QID) | INTRAMUSCULAR | Status: DC | PRN

## 2021-09-23 NOTE — Progress Notes (Signed)
[facility] PROGRESS NOTE  Sherry Baker SKA:[id number] DOB: 1965-11-18 DOA: 09/05/2021 PCP: Default, Provider, MD  Status: Remains inpatient appropriate because:  Unsafe discharge.  Not yet medically stable.  Patient continues to require nasogastric tube for nutritional supplementation  Barriers to discharge: Social: Patient was homeless prior to admission but also has resources available (disability check and Medicaid) so once medically stable can discharge either to long stay hotel or [facility]  Clinical: Continued need for supplemental nutrition via cortrack tube; therapy also recommending SNF for short-term rehab  Level of care:  Med-Surg   Code Status: Full Family Communication: Patient only DVT prophylaxis: Subcutaneous heparin COVID vaccination status unknown  HPI: 55 year old F with PMH of EtOH abuse, complicated EtOH withdrawal requiring intubation, cocaine use, tobacco use disorder, CKD-3, HTN, depression, bipolar disorder, vitamin D deficiency, vitamin B12 deficiency, rectal cancer s/p resection, chemo and ostomy brought to ED after found to be minimally responsive behind a dumpster surrounded by beer cans.  She was hypotensive with tachycardia, agitation and intermittent somnolence.  She was intubated for airway protection and admitted to ICU for complicated EtOH withdrawal.  Initially treated with Precedex which was discontinued due to bradycardia.  She was a started on fentanyl drip, Librium and Ativan as needed.  She also received empiric antibiotics from 11/4-11/8 with Vanco and Unasyn then on Vanco and CTX presumably for respiratory infection. Eventually extubated on 11/10, and transferred to [facility] service on 11/12.  Encephalopathy improved.  Dysphagia resolved.  Started on regular diet on 11/18.  Monitoring p.o. intake to ensure adequate caloric intake.  Therapy recommended SNF.  Subjective: Awake and alert.  Reports she is trying to increase her oral intake.  No other  complaints verbalized.  Objective: Vitals:   09/22/21 2009 09/23/21 0526  BP: 112/68 115/65  Pulse: 87 89  Resp:    Temp: 99.7 F (37.6 C) 98.9 F (37.2 C)  SpO2: 96% 98%    Intake/Output Summary (Last 24 hours) at 09/23/2021 0824 Last data filed at 09/23/2021 0600 Gross per 24 hour  Intake 2367.33 ml  Output 200 ml  Net 2167.33 ml   Filed Weights   09/20/21 0449 09/21/21 0419 09/22/21 0325  Weight: 45.6 kg 46 kg 46.8 kg    Exam:  Constitutional: NAD, calm, comfortable Neck: normal, supple, no masses, no thyromegaly Respiratory: clear to auscultation bilaterally, no wheezing, no crackles. Normal respiratory effort. No accessory muscle use.  Cardiovascular: Regular rate and rhythm, no murmurs / rubs / gallops. No extremity edema. 2+ pedal pulses. No carotid bruits.  Abdomen: no tenderness, no masses palpated. Bowel sounds positive.  Neurologic: CN 2-12 grossly intact. Sensation intact, DTR normal. Strength 4/5 x all 4 extremities.  Psychiatric: Alert and oriented x 3. Normal mood.    Assessment/Plan: Acute problems:   Acute metabolic encephalopathy Resolved likely related to alcohol.  CTH, ammonia, TSH, B12 and RPR unrevealing.  Completed course of high-dose thiamine.   Complicated alcohol withdrawal requiring intubation and mechanical ventilation/Cocaine use disorder -Completed Librium taper and high-dose thiamine as above. -Continue multivitamins, folic acid and thiamine. -TOC of information regarding substance abuse treatment.  Patient accepted.   Dysphagia:  Resolved; transient secondary to altered mentation and recent intubation -Now on regular diet but intake remains variable -Once tolerating at least 50% of regular diet at all meals can discontinue supplemental tube feeding and cortrack tube -Continue nocturnal tube feeding   Abnormal EKG with ST changes Mildly elevated troponin, 24>54>66 secondary to demand ischemia Chronic diastolic CHF-TTE on  11/4  with LVEF of 55 to 60%, G1 DD, no RWMA Essential hypertension -Continue Coreg, amlodipine   Tobacco use disorder -Encourage cessation  -Nicotine patch   AKI on CKD 3a  -11/18: BUN 52 creatinine 1.45 and a sodium of 132  -Baseline renal function creatinine between 1.4 and 1.7 with a BUN in the 20s   Vitamin D deficiency -21.16 -Continue replacement   History of rectal cancer s/p resection w/ostomy -Routine ostomy care   GERD -Continue Protonix   Anxiety/depression/bipolar 1 disorder -Continue BuSpar and Celexa, Atarax, as needed Ativan   RUE swelling/superficial thrombophlebitis -Axillary line was discontinued   Physical deconditioning due to acute illness and chronic alcohol abuse -PT/OT-recommends SNF    Prolonged QT:  Resolved on repeat EKG.   Severe malnutrition: as evidenced by low BMI and significant muscle mass and subcu fat loss. Body mass index is 18.55 kg/m. Nutrition Problem: Severe Malnutrition Etiology: chronic illness (cirrhosis, polysubstance abuse) Signs/Symptoms: severe muscle depletion, severe fat depletion Interventions: MVI, Tube feeding, Prostat        Data Reviewed: Basic Metabolic Panel: Recent Labs  Lab 09/18/21 0128 09/19/21 0459 09/20/21 0418  NA 136 133* 132*  K 4.7 5.1 4.7  CL 105 103 100  CO2 20* 23 23  GLUCOSE 78 109* 96  BUN 49* 58* 52*  CREATININE 1.48* 1.61* 1.45*  CALCIUM 9.4 9.3 9.8  MG 2.4 2.6* 2.3  PHOS 6.2* 6.0* 5.6*   Liver Function Tests: Recent Labs  Lab 09/18/21 0128 09/19/21 0459 09/20/21 0418  AST  --  50* 42*  ALT  --  62* 57*  ALKPHOS  --  75 69  BILITOT  --  0.4 0.2*  PROT  --  7.8 7.5  ALBUMIN 2.7* 2.8* 2.8*  2.8*    CBC: Recent Labs  Lab 09/18/21 0128 09/19/21 0459  WBC 6.8 6.2  HGB 10.5* 10.2*  HCT 33.2* 32.7*  MCV 90.2 90.8  PLT 396 396    BNP (last 3 results) Recent Labs    03/30/21 0153 06/23/21 0100  BNP 231.8* 485.8*      CBG: Recent Labs  Lab 09/22/21 0424  09/22/21 0804 09/22/21 1155 09/22/21 1550 09/22/21 2004  GLUCAP 94 105* 111* 85 77    Scheduled Meds:  (feeding supplement) PROSource Plus  30 mL Oral BID BM   amLODipine  10 mg Oral Daily   busPIRone  15 mg Oral BID   carvedilol  9.375 mg Oral BID WC   Chlorhexidine Gluconate Cloth  6 each Topical Daily   cholecalciferol  1,000 Units Oral Daily   citalopram  10 mg Oral Daily   diclofenac Sodium  4 g Topical QID   docusate sodium  100 mg Oral Daily   feeding supplement (NEPRO CARB STEADY)  237 mL Oral BID BM   feeding supplement (OSMOLITE 1.5 CAL)  840 mL Per Tube H41D   folic acid  1 mg Oral Daily   free water  150 mL Per Tube Q4H   heparin  5,000 Units Subcutaneous Q8H   hydrOXYzine  10 mg Oral TID   lidocaine  1 patch Transdermal Q24H   mouth rinse  15 mL Mouth Rinse BID   multivitamin with minerals  1 tablet Oral Daily   nicotine  14 mg Transdermal Daily   pantoprazole  40 mg Oral Daily   polyethylene glycol  17 g Oral Daily   sevelamer carbonate  800 mg Oral TID WC   sodium chloride flush  10-40  mL Intracatheter Q12H   thiamine  100 mg Oral Daily   Continuous Infusions:  Active Problems:   Substance abuse ([place])   Depression   Severe malnutrition (HCC)   Alcohol abuse   Tobacco abuse   Cocaine abuse ([place])   Bipolar affective disorder ([place])   Rectal cancer ([place])   Pressure injury of skin   Alcohol withdrawal ([place])   Consultants: CCM  Procedures: Echocardiogram Cortrack tube placement  Antibiotics: Unasyn 11/4 and 11/5 Vancomycin x1 dose 11/ Ceftriaxone 11/6 through 11/08   Time spent: 35 minutes    Erin Hearing ANP  [facility] 7 am - 330 pm/M-F for direct patient care and secure chat Please refer to Amion for contact info 18  days

## 2021-09-23 NOTE — Consult Note (Signed)
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  Pressure Injury POA: No Measurement: 2X1X.5cm, 50% red, 50% yellow, small amt tan drainage Dressing procedure/placement/frequency: Continue present plan of care; moist gauze dressings are being performed by the bedside nurses daily to promote healing.  [place] team will reassess weekly to determine if a change in the plan of care is indicated at that time.  Julien Girt MSN, RN, [place], [place], [place]

## 2021-09-23 NOTE — Progress Notes (Signed)
Physical Therapy Treatment Patient Details Name: Sherry Baker MRN: [id number] DOB: November 13, 1965 Today's Date: 09/23/2021   History of Present Illness Pt is a 55 y/o female brought to ED after found to be minimally responsive behind a dumpster surrounded by beer cans.  She was hypotensive with tachycardia, agitation and intermittent somnolence.  She was intubated for airway protection and admitted to ICU for complicated EtOH withdrawal.  Initially treated with Precedex which was discontinued due to bradycardia.  She was a started on fentanyl drip, Librium and Ativan as needed.  She also received empiric antibiotics from 11/4-11/8 with Vanco and Unasyn then on Vanco and CTX presumably for respiratory infection. Eventually extubated on 11/10, and transferred to [facility] service on 11/12.  She is on TF via cortrak for dysphagia. PMH including but not limited to EtOH abuse, complicated EtOH withdrawal requiring intubation, cocaine use, tobacco use disorder, CKD-3, HTN, depression, bipolar disorder, vitamin D deficiency, vitamin B12 deficiency, rectal cancer s/p resection, chemo and ostomy.    PT Comments    Pt admitted with above diagnosis. Pt was able to ambulate with min guard assist to min assist as she is unsteady occasionally losing balance.  Needs continued PT to address endurance,balance and cognition. Pt currently with functional limitations due to balance and endurance deficits. Pt will benefit from skilled PT to increase their independence and safety with mobility to allow discharge to the venue listed below.      Recommendations for follow up therapy are one component of a multi-disciplinary discharge planning process, led by the attending physician.  Recommendations may be updated based on patient status, additional functional criteria and insurance authorization.  Follow Up Recommendations  Skilled nursing-short term rehab (<3 hours/day)     Assistance Recommended at Discharge Frequent or  constant Supervision/Assistance  Equipment Recommendations  Rolling walker (2 wheels)    Recommendations for Other Services       Precautions / Restrictions Precautions Precautions: Fall Precaution Comments: NGT, colostomy Restrictions Weight Bearing Restrictions: No     Mobility  Bed Mobility Overal bed mobility: Needs Assistance Bed Mobility: Supine to Sit;Sit to Supine     Supine to sit: HOB elevated;Min guard     General bed mobility comments: for safety, no physical assist but requires sequencing cues    Transfers Overall transfer level: Needs assistance Equipment used: 1 person hand held assist Transfers: Sit to/from Stand Sit to Stand: Min assist           General transfer comment: assist for rise, steadying. assist to clean up as she was wet with urine in bed on arrival.    Ambulation/Gait Ambulation/Gait assistance: Min assist Gait Distance (Feet): 280 Feet Assistive device: 1 person hand held assist;IV Pole Gait Pattern/deviations: Decreased stride length;Shuffle;Narrow base of support;Trunk flexed;Ataxic;Step-through pattern;Scissoring;Drifts right/left Gait velocity: decr Gait velocity interpretation: <1.31 ft/sec, indicative of household ambulator   General Gait Details: light steadying assist, pt witnessed with narrow BOS and scissoring gait at times but pt-corrected. Needs HHA at times as she reaches for staff or furniture/rails in hall. Cues for avoiding obstacles/safety   Stairs             Wheelchair Mobility    Modified Rankin (Stroke Patients Only)       Balance Overall balance assessment: Needs assistance Sitting-balance support: Feet supported;No upper extremity supported Sitting balance-Leahy Scale: Fair     Standing balance support: Bilateral upper extremity supported;During functional activity Standing balance-Leahy Scale: Poor Standing balance comment: relies on 1 Ue support  at times.                             Cognition Arousal/Alertness: Awake/alert Behavior During Therapy: Flat affect Overall Cognitive Status: Difficult to assess                                 General Comments: A&Ox3, can be impulsive, requires cues to wait for PT assist given fall risk.        Exercises      General Comments        Pertinent Vitals/Pain Pain Assessment: No/denies pain    Home Living                          Prior Function            PT Goals (current goals can now be found in the care plan section) Acute Rehab PT Goals Patient Stated Goal: unable to state Progress towards PT goals: Progressing toward goals    Frequency    Min 2X/week      PT Plan Current plan remains appropriate    Co-evaluation              AM-PAC PT "6 Clicks" Mobility   Outcome Measure  Help needed turning from your back to your side while in a flat bed without using bedrails?: A Little Help needed moving from lying on your back to sitting on the side of a flat bed without using bedrails?: A Little Help needed moving to and from a bed to a chair (including a wheelchair)?: A Lot Help needed standing up from a chair using your arms (e.g., wheelchair or bedside chair)?: A Little Help needed to walk in hospital room?: A Lot Help needed climbing 3-5 steps with a railing? : A Lot 6 Click Score: 15    End of Session Equipment Utilized During Treatment: Gait belt Activity Tolerance: Patient limited by fatigue;Patient limited by pain Patient left: with call bell/phone within reach;in chair;with chair alarm set Nurse Communication: Mobility status PT Visit Diagnosis: Other abnormalities of gait and mobility (R26.89)     Time: 2878-6767 PT Time Calculation (min) (ACUTE ONLY): 25 min  Charges:  $Gait Training: 8-22 mins $Self Care/Home Management: 8-22                     [facility] M,PT Acute Rehab Services [phone] (pager)    Alvira Philips 09/23/2021, 4:04 PM

## 2021-09-23 NOTE — Progress Notes (Signed)
Speech Language Pathology Treatment: Dysphagia  Patient Details Name: Sherry Baker MRN: [id number] DOB: Dec 02, 1965 Today's Date: 09/23/2021 Time: 1050-1100 SLP Time Calculation (min) (ACUTE ONLY): 10 min  Assessment / Plan / Recommendation Clinical Impression  Pt demonstrates tolerance of thin liquids and regular solids. No further complaint or sign of dysphagia. SLP will sign off.   HPI HPI: 55 yo F admitted to the hospital for cocaine intoxication and etoh withdrawal. In the ED the pt was exhibiting hypertension, tachycardia and reportedly intermittent agitation so received 20mg  of geodon and 2mg  of ativan. Pt was intermittently somnolent prior to and after the meds in the ED. PCCM consulted for evaluation and pt was found to not be able to protect her airway, subsequently was intubated and admitted. CXR 11/7 concerning for BLL opacities. Pt with ETT 11/3-11/10. Pt is known to this service from prior admissions.  She was unable to participate in assessment in May of this year.  In August 2020 pt was seen at bedside with recommendations for mechanical soft solids and thin liquid.  Pt with prior medical history of ETOH use disorder, complicated etoh withdrawals requiring prior intubation; Cocaine abuse; HTN; CKD3; GERD; Bipolar 1; Depression; Tobacco dependence; Rectal cancer s/p resection, chemo, and s/p ostomy; BSE completed 09/13/21 with recommendation for NPO with potential temporary non-oral feeding; Cortrak placed on 09/13/21.  SLE f/u for PO readiness.      SLP Plan         Recommendations for follow up therapy are one component of a multi-disciplinary discharge planning process, led by the attending physician.  Recommendations may be updated based on patient status, additional functional criteria and insurance authorization.    Recommendations  Diet recommendations: Regular;Thin liquid Liquids provided via: Cup;Straw Medication Administration: Whole meds with liquid Supervision:  Patient able to self feed Compensations: Slow rate;Small sips/bites;Minimize environmental distractions Postural Changes and/or Swallow Maneuvers: Seated upright 90 degrees                Follow Up Recommendations: No SLP follow up Assistance recommended at discharge: Set up Supervision/Assistance SLP Visit Diagnosis: Dysphagia, unspecified (R13.10)       GO                Briell Paulette, Katherene Ponto  09/23/2021, 1:17 PM

## 2021-09-23 NOTE — Progress Notes (Addendum)
Nutrition Follow-up  DOCUMENTATION CODES:   Severe malnutrition in context of chronic illness  INTERVENTION:   Recommend discontinuing Cortrak Continue Nepro Shake po BID, each supplement provides 425 kcal and 19 grams protein Continue 30 ml ProSource Plus BID, each supplement provides 100 kcals and 15 grams protein.  Continue Multivitamin w/ minerals daily Encourage good PO intake  Discontinue calorie count  NUTRITION DIAGNOSIS:   Severe Malnutrition related to chronic illness (cirrhosis, polysubstance abuse) as evidenced by severe muscle depletion, severe fat depletion. - Ongoing  GOAL:   Patient will meet greater than or equal to 90% of their needs - Progressing  MONITOR:   PO intake, Supplement acceptance, TF tolerance, Skin, Labs  REASON FOR ASSESSMENT:   Consult Calorie Count  ASSESSMENT:   55 year old female who presented to the ED on 11/03 with AMS. PMH of EtOH abuse, cirrhosis, complicated EtOH withdrawals requiring prior intubation, cocaine abuse, HTN, CKD stage III, GERD, rectal cancer s/p resection with colostomy, bipolar 1 disorder, depression, tobacco abuse. Pt admitted with cocaine intoxication and EtOH withdrawal and required intubation for airway protection.  11/09: TF d/c  11/10: extubated; OG removed 11/11: transferred to the floor; Cortrak placed (gastric/D1); TF started  11/18: diet advanced to regular, thin liquids  Calorie Count Results Day 1 Breakfast: 0 kcal, 0 gm PRO Lunch: 190 kcal, 4 gm PRO Dinner: 81 kcal,  4 gm PRO Supplements: 520 kcal, 34 gm PRO  Total intake: 791 kcal (49% of minimum estimated needs)  42 gm protein (53% of minimum estimated needs)  Day 2 Breakfast: 138 kcal, 5 gm PRO  Lunch: No Documentation Dinner: No Documentation Supplements: 350 kcal, 20 gm PRO  Total intake: 488 kcal (37% of minimum estimated needs) 25 gm Protein (31% of minimum estimated needs)  Pt reports that she has been eating well and that she  is drinking 2 Nepro's per day plus 2 ProSource Plus. Pt asked when we could remove Cortrak, informed pt that we have to make sure she is eating adequately before removing. Pt states that she is eating ~50% of her meals. Pt reports that she ate all of her meatloaf, broccoli, and soup. Asked pt if her intake would be better if she was able to pick the items that she gets to eat; pt thinks yes, PLDN to make pt assist with ordering.  Per Epic, pt intake includes: 11/20: Breakfast 75%, Lunch 75%, Dinner 50% 11/21: Breakfast 25%  RN reports that the pt said she feels full in the morning after feedings are stopped and breakfast comes too soon.   Medications reviewed and include: Vitamin D3, Colace, Folic Acid, MVI, Protonix, Miralax, Renvela, Thiamine Labs reviewed.  Diet Order:   Diet Order             Diet regular Room service appropriate? Yes; Fluid consistency: Thin  Diet effective now                   EDUCATION NEEDS:   No education needs have been identified at this time  Skin:  Skin Assessment: Skin Integrity Issues: Skin Integrity Issues:: Stage III, Other (Comment) Stage III: coccyx Other: skin tear L arm  Last BM:  09/23/2021 - 200 mL via colostomy  Height:   Ht Readings from Last 1 Encounters:  09/05/21 5\' 2"  (1.575 m)    Weight:   Wt Readings from Last 1 Encounters:  09/22/21 46.8 kg    Ideal Body Weight:  50 kg  BMI:  Body mass index  is 18.87 kg/m.  Estimated Nutritional Needs:   Kcal:  1600-1800  Protein:  80-95g  Fluid:  >1.6L    Hermina Barters BS, PLDN Clinical Dietitian See AMiON for contact information.

## 2021-09-23 NOTE — Progress Notes (Addendum)
[place] visited pt. per page for assistance w/AD.  Pt. lying in bed w/visitor at bedside; pt. confirms she would like to complete AD and is also interested in durable POA paperwork.  CH provided HCPOA and DPOA paperwork but explained that hospital can only assist with the [place].  [facility] explained HCPOA/Living Will and left document for pt. and family to peruse; chaplains remain available via page to assist with arranging for notarization of document or to answer further questions.  Lindaann Pascal, Chaplain Pager: [phone]

## 2021-09-23 NOTE — TOC Progression Note (Addendum)
Transition of Care [facility]) - Progression Note    Patient Details  Name: Sherry Baker MRN: [id number] Date of Birth: 06/05/1966  Transition of [facility]) CM/SW [place], RN Phone Number: 09/23/2021, 1:27 PM  Clinical Narrative:    Case management met with the patient at the bedside regarding TOC needs and discharge planning.  The patient states that she doesn't remember coming into the hospital upon admission but states that she has been homeless for years and stays with friends.  The patient states that she does not have her bank card or cell phone with her at this time.  Previous TOC note states that the patient was living with a boyfriend per the patient's cousin.  CAGE assessment was completed and patient was offered Outpatient Substance abuse information.  The patient was accepting of the information but was not interested in these services.  The patient has past history of drug and alcohol use and is not interested in discontinuing use.  The patient currently has Cortrak in place at this time.  CM and MSW with DTP Team will continue to follow the patient for discharge needs - likely discharge home with family and/or IRC.  09/23/2021  1425 - CM called and spoke with the patient's cousin, Bobbie Stack, and she states that patient has had a drug addiction for years and that patient is agreeable to let the family member assist her to find safe housing - pending [address] apartments in [place], [place]. The patient was living with a boyfriend prior to admission to the hospital  She receives a disability check of 841.00 per month.  The patient verbalizes that she is not interested in outpatient drug rehabilitation services and the patient's family is aware of this as well.   Richardson Landry visited with the patient this morning and the patient is physically getting back to her baseline - with po intake improving.  [place] plans to provide the patient with transportation and money  management when she is discharged from the hospital.  Richardson Landry has the patient's cell phone and will be assisting at discharge.  CM and MSW with DTP Team will continue to follow the patient for discharge planning in care of family - [name], cousin - [phone].     Expected Discharge Plan: Homeless Shelter Barriers to Discharge: Continued Medical Work up  Expected Discharge Plan and Services Expected Discharge Plan: Homeless Shelter In-house Referral: PCP / [facility] Discharge Planning Services: CM Consult   Living arrangements for the past 2 months: Homeless                                       Social Determinants of Health (SDOH) Interventions    Readmission Risk Interventions No flowsheet data found.

## 2021-09-24 DIAGNOSIS — E43 Unspecified severe protein-calorie malnutrition: Secondary | ICD-10-CM | POA: Diagnosis not present

## 2021-09-24 DIAGNOSIS — F1410 Cocaine abuse, uncomplicated: Secondary | ICD-10-CM | POA: Diagnosis not present

## 2021-09-24 DIAGNOSIS — R5381 Other malaise: Secondary | ICD-10-CM | POA: Diagnosis not present

## 2021-09-24 DIAGNOSIS — F10921 Alcohol use, unspecified with intoxication delirium: Secondary | ICD-10-CM | POA: Diagnosis not present

## 2021-09-24 DIAGNOSIS — R262 Difficulty in walking, not elsewhere classified: Secondary | ICD-10-CM

## 2021-09-24 LAB — GLUCOSE, CAPILLARY
Glucose-Capillary: 92 mg/dL (ref 70–99)
Glucose-Capillary: 91 mg/dL (ref 70–99)

## 2021-09-24 NOTE — Progress Notes (Signed)
Occupational Therapy Treatment Patient Details Name: Sherry Baker MRN: [id number] DOB: October 28, 1966 Today's Date: 09/24/2021   History of present illness Pt is a 55 y/o female brought to ED after found to be minimally responsive behind a dumpster surrounded by beer cans.  She was hypotensive with tachycardia, agitation and intermittent somnolence.  She was intubated for airway protection and admitted to ICU for complicated EtOH withdrawal.  Initially treated with Precedex which was discontinued due to bradycardia.  She was a started on fentanyl drip, Librium and Ativan as needed.  She also received empiric antibiotics from 11/4-11/8 with Vanco and Unasyn then on Vanco and CTX presumably for respiratory infection. Eventually extubated on 11/10, and transferred to [facility] service on 11/12.  She is on TF via cortrak for dysphagia. PMH including but not limited to EtOH abuse, complicated EtOH withdrawal requiring intubation, cocaine use, tobacco use disorder, CKD-3, HTN, depression, bipolar disorder, vitamin D deficiency, vitamin B12 deficiency, rectal cancer s/p resection, chemo and ostomy.   OT comments  Pt progressing towards acute OT goals. Pt happily reporting recent removal of Cortrak. Pt completed toileting tasks, grooming task standing at sink then walked in the halls a bit simulating household mobility. Full session details below. D/c recommendation remains appropriate though acknowledge difficulty of placement.     Recommendations for follow up therapy are one component of a multi-disciplinary discharge planning process, led by the attending physician.  Recommendations may be updated based on patient status, additional functional criteria and insurance authorization.    Follow Up Recommendations  Skilled nursing-short term rehab (<3 hours/day)    Assistance Recommended at Discharge Frequent or constant Supervision/Assistance  Equipment Recommendations  BSC/3in1    Recommendations for Other  Services      Precautions / Restrictions Precautions Precautions: Fall Precaution Comments: NGT, colostomy Restrictions Weight Bearing Restrictions: No       Mobility Bed Mobility Overal bed mobility: Needs Assistance Bed Mobility: Supine to Sit     Supine to sit: HOB elevated;Min guard          Transfers Overall transfer level: Needs assistance Equipment used: 1 person hand held assist;Rolling walker (2 wheels) Transfers: Sit to/from Stand Sit to Stand: Min assist;Min guard           General transfer comment: min guard to min A to steady. to/from EOB, recliner, and toilet heights. cues for technique with rw     Balance Overall balance assessment: Needs assistance Sitting-balance support: Feet supported;No upper extremity supported Sitting balance-Leahy Scale: Fair     Standing balance support: Bilateral upper extremity supported;Single extremity supported;During functional activity Standing balance-Leahy Scale: Poor Standing balance comment: relies on 1 Ue support at times.                           ADL either performed or assessed with clinical judgement   ADL Overall ADL's : Needs assistance/impaired                         Toilet Transfer: Min guard;Ambulation;Rolling walker (2 wheels)   Toileting- Clothing Manipulation and Hygiene: Set up;Supervision/safety;Sitting/lateral lean       Functional mobility during ADLs: Min guard;Minimal assistance;Rolling walker (2 wheels) General ADL Comments: Pt utilized rw for household distance functional mobility (hallway). Up to toilet, stood for 1 grooming task, and functional mobility mostly min guard level, min A to steady at times. Decreased safety awareness and decreased balance noted with  increase in environmental distractions.    Extremity/Trunk Assessment Upper Extremity Assessment Upper Extremity Assessment: Generalized weakness;Overall [facility] for tasks assessed   Lower Extremity  Assessment Lower Extremity Assessment: Defer to PT evaluation        Vision       Perception     Praxis      Cognition Arousal/Alertness: Awake/alert Behavior During Therapy: Flat affect Overall Cognitive Status: No family/caregiver present to determine baseline cognitive functioning                                 General Comments: follows one steps commands consistently. difficulty dual tasking. some STM deficits noted          Exercises     Shoulder Instructions       General Comments      Pertinent Vitals/ Pain       Pain Assessment: Faces Faces Pain Scale: Hurts little more Pain Location: rectum in seated position Pain Descriptors / Indicators: Grimacing;Restless Pain Intervention(s): Monitored during session;Repositioned;Limited activity within patient's tolerance;Other (comment) (provided waffle/inflatable seat cushion for recliner use)  Home Living                                          Prior Functioning/Environment              Frequency  Min 2X/week        Progress Toward Goals  OT Goals(current goals can now be found in the care plan section)  Progress towards OT goals: Progressing toward goals  Acute Rehab OT Goals Patient Stated Goal: did not state OT Goal Formulation: With patient Time For Goal Achievement: 09/30/21 Potential to Achieve Goals: Fair ADL Goals Pt Will Perform Grooming: with mod assist;sitting Pt Will Perform Upper Body Bathing: with mod assist;sitting Pt Will Perform Lower Body Bathing: with mod assist;sitting/lateral leans Pt Will Perform Upper Body Dressing: with mod assist;sitting Pt Will Perform Lower Body Dressing: with mod assist;sitting/lateral leans Pt Will Transfer to Toilet: with mod assist;stand pivot transfer;bedside commode  Plan Discharge plan remains appropriate    Co-evaluation                 AM-PAC OT "6 Clicks" Daily Activity     Outcome Measure    Help from another person eating meals?: A Little Help from another person taking care of personal grooming?: A Lot Help from another person toileting, which includes using toliet, bedpan, or urinal?: A Little Help from another person bathing (including washing, rinsing, drying)?: A Little Help from another person to put on and taking off regular upper body clothing?: A Little Help from another person to put on and taking off regular lower body clothing?: A Little 6 Click Score: 17    End of Session Equipment Utilized During Treatment: Rolling walker (2 wheels)  OT Visit Diagnosis: Muscle weakness (generalized) (M62.81);Other symptoms and signs involving cognitive function;Pain   Activity Tolerance Patient tolerated treatment well   Patient Left in chair;with call bell/phone within reach;with chair alarm set   Nurse Communication Mobility status        Time: 7681-1572 OT Time Calculation (min): 22 min  Charges: OT General Charges $OT Visit: 1 Visit OT Treatments $Self Care/Home Management : 8-22 mins  Tyrone Schimke, OT Acute Rehabilitation Services Pager: [phone] Office: [phone]   Tyrone Schimke  H 09/24/2021, 12:13 PM

## 2021-09-24 NOTE — Progress Notes (Addendum)
[facility] PROGRESS NOTE  Sherry Baker LXB:[id number] DOB: May 11, 1966 DOA: 09/05/2021 PCP: Default, Provider, MD  Status: Remains inpatient appropriate because:  Unsafe discharge.  Not yet medically stable.  Patient continues to require nasogastric tube for nutritional supplementation  Barriers to discharge: Social: Patient was homeless prior to admission but also has resources available (disability check and Medicaid) so once medically stable can discharge either to long stay hotel or [facility]  Clinical: Continued need for supplemental nutrition via cortrack tube; therapy also recommending SNF for short-term rehab  Level of care:  Med-Surg   Code Status: Full Family Communication: Patient only DVT prophylaxis: Subcutaneous heparin COVID vaccination status unknown  HPI: 55 year old F with PMH of EtOH abuse, complicated EtOH withdrawal requiring intubation, cocaine use, tobacco use disorder, CKD-3, HTN, depression, bipolar disorder, vitamin D deficiency, vitamin B12 deficiency, rectal cancer s/p resection, chemo and ostomy brought to ED after found to be minimally responsive behind a dumpster surrounded by beer cans.  She was hypotensive with tachycardia, agitation and intermittent somnolence.  She was intubated for airway protection and admitted to ICU for complicated EtOH withdrawal.  Initially treated with Precedex which was discontinued due to bradycardia.  She was a started on fentanyl drip, Librium and Ativan as needed.  She also received empiric antibiotics from 11/4-11/8 with Vanco and Unasyn then on Vanco and CTX presumably for respiratory infection. Eventually extubated on 11/10, and transferred to [facility] service on 11/12.  Encephalopathy improved.  Dysphagia resolved.  Started on regular diet on 11/18.  Monitoring p.o. intake to ensure adequate caloric intake.  Therapy recommended SNF.  Subjective: No specific complaints.  Stated she is tired after increased activity  yesterday.  Objective: Vitals:   09/23/21 2202 09/24/21 0338  BP: 95/78 127/78  Pulse: 88 82  Resp: 17 17  Temp:  97.9 F (36.6 C)  SpO2: 94% 98%    Intake/Output Summary (Last 24 hours) at 09/24/2021 0817 Last data filed at 09/24/2021 4163 Gross per 24 hour  Intake 150 ml  Output 200 ml  Net -50 ml   Filed Weights   09/20/21 0449 09/21/21 0419 09/22/21 0325  Weight: 45.6 kg 46 kg 46.8 kg    Exam:  Constitutional: NAD, calm, comfortable Neck: normal, supple, no masses, no thyromegaly Respiratory: clear to auscultation bilaterally, room air, normal respiratory effort. No accessory muscle use.  Cardiovascular: Regular rate and rhythm, no murmurs / rubs / gallops. No extremity edema.  Abdomen: no tenderness, no masses palpated. Bowel sounds positive.  Core track tube remains in place but tube feedings are off.  LBM 11/21 Neurologic: CN 2-12 grossly intact. Sensation intact, DTR normal. Strength 4/5 x all 4 extremities.  Psychiatric: Alert and oriented x 3. Normal mood.    Assessment/Plan: Acute problems:   Acute metabolic encephalopathy Resolved likely related to alcohol.  CTH, ammonia, TSH, B12 and RPR unrevealing.  Completed course of high-dose thiamine.   Complicated alcohol withdrawal requiring intubation and mechanical ventilation/Cocaine use disorder -Completed Librium taper and high-dose thiamine as above. -Continue multivitamins, folic acid and thiamine. -TOC of information regarding substance abuse treatment.  Patient accepted.   Dysphagia:  Resolved; transient secondary to altered mentation and recent intubation -Now on regular diet but intake remains variable -Notified by nutritional team on 11/21 patient tolerating 75% of meals and recommendation was to discontinue feeding tube.  Order placed but as of this morning feeding tube remains in place.  Have updated nursing staff regarding orders to remove   Abnormal EKG with  ST changes Mildly elevated  troponin, 24>54>66 secondary to demand ischemia Chronic diastolic CHF-TTE on 35/5 with LVEF of 55 to 60%, G1 DD, no RWMA Essential hypertension -Continue Coreg, amlodipine   Tobacco use disorder -Encourage cessation  -Nicotine patch   AKI on CKD 3a  -11/18: BUN 52 creatinine 1.45 and a sodium of 132  -Baseline renal function creatinine between 1.4 and 1.7 with a BUN in the 20s   Vitamin D deficiency -21.16 -Continue replacement   History of rectal cancer s/p resection w/ostomy -Routine ostomy care   GERD -Continue Protonix   Anxiety/depression/bipolar 1 disorder -Continue BuSpar and Celexa, Atarax, as needed Ativan   RUE swelling/superficial thrombophlebitis -Axillary line was discontinued   Physical deconditioning/ambulatory dysfunction due to acute illness and chronic alcohol abuse -PT/OT-recommends SNF -In the event she receives no bed offers have asked PT to treat more frequently    Prolonged QT:  Resolved on repeat EKG.   Severe malnutrition: as evidenced by low BMI and significant muscle mass and subcu fat loss. Body mass index is 18.55 kg/m. Nutrition Problem: Severe Malnutrition Etiology: chronic illness (cirrhosis, polysubstance abuse) Signs/Symptoms: severe muscle depletion, severe fat depletion Interventions: MVI, Tube feeding, Prostat        Data Reviewed: Basic Metabolic Panel: Recent Labs  Lab 09/18/21 0128 09/19/21 0459 09/20/21 0418  NA 136 133* 132*  K 4.7 5.1 4.7  CL 105 103 100  CO2 20* 23 23  GLUCOSE 78 109* 96  BUN 49* 58* 52*  CREATININE 1.48* 1.61* 1.45*  CALCIUM 9.4 9.3 9.8  MG 2.4 2.6* 2.3  PHOS 6.2* 6.0* 5.6*   Liver Function Tests: Recent Labs  Lab 09/18/21 0128 09/19/21 0459 09/20/21 0418  AST  --  50* 42*  ALT  --  62* 57*  ALKPHOS  --  75 69  BILITOT  --  0.4 0.2*  PROT  --  7.8 7.5  ALBUMIN 2.7* 2.8* 2.8*  2.8*    CBC: Recent Labs  Lab 09/18/21 0128 09/19/21 0459  WBC 6.8 6.2  HGB 10.5* 10.2*  HCT  33.2* 32.7*  MCV 90.2 90.8  PLT 396 396    BNP (last 3 results) Recent Labs    03/30/21 0153 06/23/21 0100  BNP 231.8* 485.8*      CBG: Recent Labs  Lab 09/23/21 0851 09/23/21 1230 09/23/21 1539 09/23/21 2354 09/24/21 0335  GLUCAP 87 96 89 88 92    Scheduled Meds:  (feeding supplement) PROSource Plus  30 mL Oral BID BM   amLODipine  10 mg Oral Daily   busPIRone  15 mg Oral BID   carvedilol  9.375 mg Oral BID WC   cholecalciferol  1,000 Units Oral Daily   citalopram  10 mg Oral Daily   diclofenac Sodium  4 g Topical QID   docusate sodium  100 mg Oral Daily   feeding supplement (NEPRO CARB STEADY)  237 mL Oral BID BM   folic acid  1 mg Oral Daily   free water  150 mL Per Tube Q4H   heparin  5,000 Units Subcutaneous Q8H   hydrOXYzine  10 mg Oral TID   lidocaine  1 patch Transdermal Q24H   mouth rinse  15 mL Mouth Rinse BID   multivitamin with minerals  1 tablet Oral Daily   nicotine  14 mg Transdermal Daily   pantoprazole  40 mg Oral Daily   polyethylene glycol  17 g Oral Daily   sevelamer carbonate  800 mg Oral TID WC  sodium chloride flush  10-40 mL Intracatheter Q12H   thiamine  100 mg Oral Daily   Continuous Infusions:  Active Problems:   Acute alcohol intoxication, with delirium ([place])   Substance abuse ([place])   Depression   Severe malnutrition ([place])   Alcohol abuse   Tobacco abuse   Cocaine abuse ([place])   Bipolar affective disorder ([place])   Rectal cancer ([place])   Pressure injury of skin   Alcohol withdrawal ([place])   Consultants: CCM  Procedures: Echocardiogram Cortrack tube placement  Antibiotics: Unasyn 11/4 and 11/5 Vancomycin x1 dose 11/ Ceftriaxone 11/6 through 11/08   Time spent: 35 minutes    Erin Hearing ANP  [facility] 7 am - 330 pm/M-F for direct patient care and secure chat Please refer to Amion for contact info 19  days

## 2021-09-24 NOTE — TOC Progression Note (Signed)
Transition of Care [facility]) - Progression Note    Patient Details  Name: Sherry Baker MRN: [id number] Date of Birth: 12/08/1965  Transition of Care [facility]) CM/SW [place], RN Phone Number: 09/24/2021, 8:35 AM  Clinical Narrative:    Case management spoke with Erin Hearing, NP this morning and the patient is being recommended for SNF placement by PT due to physical limitations including endurance, cognitive and balance issues.  Due to patient's extensive history of alcohol and drug use - potential for finding SNF placement will be doubtful.  I spoke with the patient's cousin, Training and development officer at [employer] yesterday and she is finding secure housing for the patient and will be assisting the patient with financial management of disability check.  I called and spoke with Wardell Honour, CM at Inpatient rehabilitation in [facility] and clinicals were placed in the hub for review.   Expected Discharge Plan: Homeless Shelter Barriers to Discharge: Homeless with medical needs, Continued Medical Work up  Expected Discharge Plan and Services Expected Discharge Plan: Homeless Shelter In-house Referral: PCP / Psychologist, educational Discharge Planning Services: CM Consult   Living arrangements for the past 2 months: Homeless                                       Social Determinants of Health (SDOH) Interventions    Readmission Risk Interventions Readmission Risk Prevention Plan 09/24/2021  Transportation Screening Complete  Medication Review Press photographer) Complete  PCP or Specialist appointment within 3-5 days of discharge Complete  HRI or [place] Not Complete  HRI or Home Care Consult Pt Refusal Comments history of unsafe home environment  SW Recovery Care/Counseling Consult Complete  [place] Complete  Some recent data might be hidden

## 2021-09-24 NOTE — Progress Notes (Signed)
Discontinue cor trak this morning.

## 2021-09-25 DIAGNOSIS — F10939 Alcohol use, unspecified with withdrawal, unspecified: Secondary | ICD-10-CM | POA: Diagnosis not present

## 2021-09-25 LAB — BASIC METABOLIC PANEL
Anion gap: 8 (ref 5–15)
BUN: 55 mg/dL — ABNORMAL HIGH (ref 6–20)
CO2: 26 mmol/L (ref 22–32)
Calcium: 9.1 mg/dL (ref 8.9–10.3)
Chloride: 101 mmol/L (ref 98–111)
Creatinine, Ser: 1.51 mg/dL — ABNORMAL HIGH (ref 0.44–1.00)
GFR, Estimated: 41 mL/min — ABNORMAL LOW (ref >60)
Glucose, Bld: 107 mg/dL — ABNORMAL HIGH (ref 70–99)
Potassium: 3.8 mmol/L (ref 3.5–5.1)
Sodium: 135 mmol/L (ref 135–145)

## 2021-09-25 NOTE — Progress Notes (Signed)
PT Cancellation Note  Patient Details Name: Sherry Baker MRN: [id number] DOB: 1966-03-13   Cancelled Treatment:    Reason Eval/Treat Not Completed: Other (comment);Pain limiting ability to participate.  States she hurts, then reports she is not up to any therapy today.  Pt is in bed with sheet over her head partially.  Follow up at another time.   Ramond Dial 09/25/2021, 11:05 AM  Mee Hives, PT PhD Acute Rehab Dept. Number: [place]

## 2021-09-25 NOTE — Progress Notes (Signed)
Mobility Specialist Progress Note:   09/25/21 1240  Mobility  Activity Refused mobility   Pt refused mobility d/t "hurting all over". Will f/u later today if schedule permits.   Nelta Numbers Mobility Specialist  Phone [phone]

## 2021-09-25 NOTE — Progress Notes (Signed)
PROGRESS NOTE    Sherry Baker  HFW:[id number] DOB: [birth year] DOA: 09/05/2021 PCP: Default, Provider, MD     Brief Narrative:  Sherry Baker is a 55 year old F with PMH of EtOH abuse, complicated EtOH withdrawal requiring intubation, cocaine use, tobacco use disorder, CKD-3, HTN, depression, bipolar disorder, vitamin D deficiency, vitamin B12 deficiency, rectal cancer s/p resection, chemo and ostomy brought to ED after found to be minimally responsive behind a dumpster surrounded by beer cans.  She was hypotensive with tachycardia, agitation and intermittent somnolence.  She was intubated for airway protection and admitted to ICU for complicated EtOH withdrawal.  Initially treated with Precedex which was discontinued due to bradycardia.  She was a started on fentanyl drip, Librium and Ativan as needed.  She also received empiric antibiotics from 11/4-11/8 with Vanco and Unasyn then on Vanco and CTX presumably for respiratory infection. Eventually extubated on 11/10, and transferred to [facility] service on 11/12.  Encephalopathy improved.  Dysphagia resolved.  Started on regular diet on 11/18.  Monitoring p.o. intake to ensure adequate caloric intake.  Therapy recommended SNF.  She is on DTP list.   New events last 24 hours / Subjective: States that she has been trying to eat more.  Was able to eat dinner last night, still waiting for breakfast tray.  Has some rectal pain from previous rectal cancer status postresection surgery.  Assessment & Plan:   Active Problems:   Acute alcohol intoxication, with delirium (HCC)   Substance abuse (HCC)   Depression   Severe malnutrition (HCC)   Alcohol abuse   Tobacco abuse   Cocaine abuse ([place])   Bipolar affective disorder (HCC)   Rectal cancer ([place])   Pressure injury of skin   Alcohol withdrawal ([place])   Physical deconditioning   Ambulatory dysfunction   Acute metabolic encephalopathy-likely related to alcohol.  CTH, ammonia, TSH, B12 and RPR  unrevealing. No focal neuro deficit to suggest CVA but limited exam. Completed course of high-dose thiamine.  Likely back to her baseline now.   Complicated alcohol withdrawal requiring intubation and mechanical ventilation Cocaine use disorder -Completed Librium taper and high-dose thiamine as above. -Continue multivitamins, folic acid and thiamine. -TOC consulted for counseling or resources   Dysphagia: Likely from intubation and encephalopathy.  Resolved. -Now on regular diet and cortrak has been removed   Abnormal EKG with ST changes Mildly elevated troponin, 24>54>66 secondary to demand ischemia Chronic diastolic CHF-TTE on 02/7 with LVEF of 55 to 60%, G1 DD, no RWMA Essential hypertension -Continue Coreg, amlodipine   Tobacco use disorder -Encourage cessation  -Nicotine patch   AKI on CKD 3a  -Improved   Vitamin D deficiency -Replace   History of rectal cancer s/p resection w/ostomy -Routine ostomy care   GERD -Continue Protonix   Anxiety/depression/bipolar 1 disorder -Continue BuSpar and Celexa, Atarax, as needed Ativan   RUE swelling/superficial thrombophlebitis -Axillary line was discontinued   Physical deconditioning due to acute illness and chronic alcohol abuse -PT/OT-recommends SNF    Prolonged QT: Resolved on repeat EKG.   Severe malnutrition: as evidenced by low BMI and significant muscle mass and subcu fat loss. Body mass index is 18.55 kg/m. Nutrition Problem: Severe Malnutrition Etiology: chronic illness (cirrhosis, polysubstance abuse) Signs/Symptoms: severe muscle depletion, severe fat depletion Interventions: MVI, Tube feeding, Prostat  DVT prophylaxis:  heparin injection 5,000 Units Start: 09/06/21 0600 SCDs Start: 09/05/21 2126  Code Status: Full code Family Communication: No family at bedside Disposition Plan:  Status is: Inpatient  Remains  inpatient appropriate because: Awaiting SNF placement versus safe discharge  plan  Antimicrobials:  Anti-infectives (From admission, onward)    Start     Dose/Rate Route Frequency Ordered Stop   09/08/21 0830  cefTRIAXone (ROCEPHIN) 2 g in sodium chloride 0.9 % 100 mL IVPB       Note to Pharmacy: Tolerated unasyn   2 g 200 mL/hr over 30 Minutes Intravenous Every 24 hours 09/08/21 0731 09/10/21 0832   09/07/21 1000  vancomycin (VANCOREADY) IVPB 500 mg/100 mL  Status:  Discontinued        500 mg 100 mL/hr over 60 Minutes Intravenous Every 24 hours 09/06/21 1452 09/07/21 1037   09/06/21 1633  Ampicillin-Sulbactam (UNASYN) 3 g in sodium chloride 0.9 % 100 mL IVPB  Status:  Discontinued        3 g 200 mL/hr over 30 Minutes Intravenous Every 8 hours 09/06/21 1258 09/07/21 1037   09/06/21 1545  vancomycin (VANCOREADY) IVPB 1000 mg/200 mL        1,000 mg 200 mL/hr over 60 Minutes Intravenous  Once 09/06/21 1452 09/06/21 1629   09/06/21 0915  Ampicillin-Sulbactam (UNASYN) 3 g in sodium chloride 0.9 % 100 mL IVPB  Status:  Discontinued        3 g 200 mL/hr over 30 Minutes Intravenous Every 12 hours 09/06/21 0817 09/06/21 1258        Objective: Vitals:   09/24/21 1712 09/24/21 2031 09/25/21 0553 09/25/21 0759  BP: 114/72 117/61 (!) 148/82 114/70  Pulse: 98 87 78 82  Resp: 18 18 18 19   Temp: 98.7 F (37.1 C) 98.3 F (36.8 C) 98.2 F (36.8 C) (!) 97.3 F (36.3 C)  TempSrc: Oral Oral Oral Oral  SpO2: 99% 94% 100% 97%  Weight:   45 kg   Height:        Intake/Output Summary (Last 24 hours) at 09/25/2021 1048 Last data filed at 09/25/2021 0859 Gross per 24 hour  Intake 360 ml  Output 300 ml  Net 60 ml    Filed Weights   09/21/21 0419 09/22/21 0325 09/25/21 0553  Weight: 46 kg 46.8 kg 45 kg   Examination: General exam: Appears calm and comfortable  Respiratory system: Clear to auscultation. Respiratory effort normal. Cardiovascular system: S1 & S2 heard, RRR. No pedal edema. Gastrointestinal system: Abdomen is nondistended, soft and nontender. Normal  bowel sounds heard. Central nervous system: Alert and oriented. Non focal exam. Speech clear  Extremities: Symmetric in appearance bilaterally  Skin: No rashes, lesions or ulcers on exposed skin  Psychiatry: Judgement and insight appear stable. Mood & affect appropriate.    Data Reviewed: I have personally reviewed following labs and imaging studies  CBC: Recent Labs  Lab 09/19/21 0459  WBC 6.2  HGB 10.2*  HCT 32.7*  MCV 90.8  PLT 716    Basic Metabolic Panel: Recent Labs  Lab 09/19/21 0459 09/20/21 0418  NA 133* 132*  K 5.1 4.7  CL 103 100  CO2 23 23  GLUCOSE 109* 96  BUN 58* 52*  CREATININE 1.61* 1.45*  CALCIUM 9.3 9.8  MG 2.6* 2.3  PHOS 6.0* 5.6*    GFR: Estimated Creatinine Clearance: 31.9 mL/min (A) (by C-G formula based on SCr of 1.45 mg/dL (H)). Liver Function Tests: Recent Labs  Lab 09/19/21 0459 09/20/21 0418  AST 50* 42*  ALT 62* 57*  ALKPHOS 75 69  BILITOT 0.4 0.2*  PROT 7.8 7.5  ALBUMIN 2.8* 2.8*  2.8*    No results for  input(s): LIPASE, AMYLASE in the last 168 hours. No results for input(s): AMMONIA in the last 168 hours. Coagulation Profile: No results for input(s): INR, PROTIME in the last 168 hours. Cardiac Enzymes: No results for input(s): CKTOTAL, CKMB, CKMBINDEX, TROPONINI in the last 168 hours. BNP (last 3 results) No results for input(s): PROBNP in the last 8760 hours. HbA1C: No results for input(s): HGBA1C in the last 72 hours. CBG: Recent Labs  Lab 09/23/21 1230 09/23/21 1539 09/23/21 2354 09/24/21 0335 09/24/21 0817  GLUCAP 96 89 88 92 91    Lipid Profile: No results for input(s): CHOL, HDL, LDLCALC, TRIG, CHOLHDL, LDLDIRECT in the last 72 hours. Thyroid Function Tests: No results for input(s): TSH, T4TOTAL, FREET4, T3FREE, THYROIDAB in the last 72 hours. Anemia Panel: No results for input(s): VITAMINB12, FOLATE, FERRITIN, TIBC, IRON, RETICCTPCT in the last 72 hours. Sepsis Labs: No results for input(s):  PROCALCITON, LATICACIDVEN in the last 168 hours.   No results found for this or any previous visit (from the past 240 hour(s)).    Radiology Studies: No results found.    Scheduled Meds:  (feeding supplement) PROSource Plus  30 mL Oral BID BM   amLODipine  10 mg Oral Daily   busPIRone  15 mg Oral BID   carvedilol  9.375 mg Oral BID WC   cholecalciferol  1,000 Units Oral Daily   citalopram  10 mg Oral Daily   diclofenac Sodium  4 g Topical QID   docusate sodium  100 mg Oral Daily   feeding supplement (NEPRO CARB STEADY)  237 mL Oral BID BM   folic acid  1 mg Oral Daily   heparin  5,000 Units Subcutaneous Q8H   hydrOXYzine  10 mg Oral TID   lidocaine  1 patch Transdermal Q24H   mouth rinse  15 mL Mouth Rinse BID   multivitamin with minerals  1 tablet Oral Daily   nicotine  14 mg Transdermal Daily   pantoprazole  40 mg Oral Daily   polyethylene glycol  17 g Oral Daily   sevelamer carbonate  800 mg Oral TID WC   thiamine  100 mg Oral Daily   Continuous Infusions:   LOS: 20 days      Time spent: 15 minutes   Dessa Phi, DO [facility] 09/25/2021, 10:48 AM   Available via Epic secure chat 7am-7pm After these hours, please refer to coverage provider listed on [email or web address]

## 2021-09-26 DIAGNOSIS — F10939 Alcohol use, unspecified with withdrawal, unspecified: Secondary | ICD-10-CM | POA: Diagnosis not present

## 2021-09-26 NOTE — Plan of Care (Signed)
  Problem: Nutrition: Goal: Adequate nutrition will be maintained Outcome: Progressing   

## 2021-09-26 NOTE — Progress Notes (Signed)
PROGRESS NOTE    Sherry Baker  FOY:[id number] DOB: 10-22-1966 DOA: 09/05/2021 PCP: Default, Provider, MD     Brief Narrative:  Sherry Baker is a 55 year old F with PMH of EtOH abuse, complicated EtOH withdrawal requiring intubation, cocaine use, tobacco use disorder, CKD-3, HTN, depression, bipolar disorder, vitamin D deficiency, vitamin B12 deficiency, rectal cancer s/p resection, chemo and ostomy brought to ED after found to be minimally responsive behind a dumpster surrounded by beer cans.  She was hypotensive with tachycardia, agitation and intermittent somnolence.  She was intubated for airway protection and admitted to ICU for complicated EtOH withdrawal.  Initially treated with Precedex which was discontinued due to bradycardia.  She was a started on fentanyl drip, Librium and Ativan as needed.  She also received empiric antibiotics from 11/4-11/8 with Vanco and Unasyn then on Vanco and CTX presumably for respiratory infection. Eventually extubated on 11/10, and transferred to [facility] service on 11/12.  Encephalopathy improved.  Dysphagia resolved.  Started on regular diet on 11/18.  Monitoring p.o. intake to ensure adequate caloric intake.  Therapy recommended SNF.  She is on DTP list.   New events last 24 hours / Subjective: Wants to go home, states that her cousin is working on getting her own apartment.  However, previous PT evaluation recommended SNF placement and patient has been refusing PT and mobility efforts yesterday.  Patient is sitting in bed, working on breakfast  Assessment & Plan:   Active Problems:   Acute alcohol intoxication, with delirium (HCC)   Substance abuse (HCC)   Depression   Severe malnutrition (HCC)   Alcohol abuse   Tobacco abuse   Cocaine abuse ([place])   Bipolar affective disorder (HCC)   Rectal cancer ([place])   Pressure injury of skin   Alcohol withdrawal ([place])   Physical deconditioning   Ambulatory dysfunction   Acute metabolic encephalopathy-likely  related to alcohol.  CTH, ammonia, TSH, B12 and RPR unrevealing. No focal neuro deficit to suggest CVA but limited exam. Completed course of high-dose thiamine.  Likely back to her baseline now.   Complicated alcohol withdrawal requiring intubation and mechanical ventilation Cocaine use disorder -Completed Librium taper and high-dose thiamine as above. -Continue multivitamins, folic acid and thiamine. -TOC consulted for counseling or resources   Dysphagia: Likely from intubation and encephalopathy.  Resolved. -Now on regular diet and cortrak has been removed   Abnormal EKG with ST changes Mildly elevated troponin, 24>54>66 secondary to demand ischemia Chronic diastolic CHF-TTE on 76/7 with LVEF of 55 to 60%, G1 DD, no RWMA Essential hypertension -Continue Coreg, amlodipine   Tobacco use disorder -Encourage cessation  -Nicotine patch   AKI on CKD 3a  -Improved   Vitamin D deficiency -Replace   History of rectal cancer s/p resection w/ostomy -Routine ostomy care   GERD -Continue Protonix   Anxiety/depression/bipolar 1 disorder -Continue BuSpar and Celexa, Atarax, as needed Ativan   RUE swelling/superficial thrombophlebitis -Axillary line was discontinued   Physical deconditioning due to acute illness and chronic alcohol abuse -PT/OT-recommends SNF    Prolonged QT: Resolved on repeat EKG.   Severe malnutrition: as evidenced by low BMI and significant muscle mass and subcu fat loss. Body mass index is 18.55 kg/m. Nutrition Problem: Severe Malnutrition Etiology: chronic illness (cirrhosis, polysubstance abuse) Signs/Symptoms: severe muscle depletion, severe fat depletion Interventions: MVI, Tube feeding, Prostat  DVT prophylaxis:  heparin injection 5,000 Units Start: 09/06/21 0600 SCDs Start: 09/05/21 2126  Code Status: Full code Family Communication: No family at bedside  Disposition Plan:  Status is: Inpatient  Remains inpatient appropriate because: Awaiting  SNF placement versus safe discharge plan  Antimicrobials:  Anti-infectives (From admission, onward)    Start     Dose/Rate Route Frequency Ordered Stop   09/08/21 0830  cefTRIAXone (ROCEPHIN) 2 g in sodium chloride 0.9 % 100 mL IVPB       Note to Pharmacy: Tolerated unasyn   2 g 200 mL/hr over 30 Minutes Intravenous Every 24 hours 09/08/21 0731 09/10/21 0832   09/07/21 1000  vancomycin (VANCOREADY) IVPB 500 mg/100 mL  Status:  Discontinued        500 mg 100 mL/hr over 60 Minutes Intravenous Every 24 hours 09/06/21 1452 09/07/21 1037   09/06/21 1633  Ampicillin-Sulbactam (UNASYN) 3 g in sodium chloride 0.9 % 100 mL IVPB  Status:  Discontinued        3 g 200 mL/hr over 30 Minutes Intravenous Every 8 hours 09/06/21 1258 09/07/21 1037   09/06/21 1545  vancomycin (VANCOREADY) IVPB 1000 mg/200 mL        1,000 mg 200 mL/hr over 60 Minutes Intravenous  Once 09/06/21 1452 09/06/21 1629   09/06/21 0915  Ampicillin-Sulbactam (UNASYN) 3 g in sodium chloride 0.9 % 100 mL IVPB  Status:  Discontinued        3 g 200 mL/hr over 30 Minutes Intravenous Every 12 hours 09/06/21 0817 09/06/21 1258        Objective: Vitals:   09/25/21 2140 09/26/21 0428 09/26/21 0500 09/26/21 0801  BP: 108/67 105/75  127/77  Pulse: 85 74  81  Resp: 18 18  18   Temp: 98.7 F (37.1 C) 98.4 F (36.9 C)  98.2 F (36.8 C)  TempSrc: Oral Oral  Oral  SpO2: 96% 97%  100%  Weight:   44.7 kg   Height:        Intake/Output Summary (Last 24 hours) at 09/26/2021 1206 Last data filed at 09/26/2021 0820 Gross per 24 hour  Intake 820 ml  Output 200 ml  Net 620 ml    Filed Weights   09/22/21 0325 09/25/21 0553 09/26/21 0500  Weight: 46.8 kg 45 kg 44.7 kg   Examination: General exam: Appears calm and comfortable  Respiratory system: Clear to auscultation. Respiratory effort normal. Cardiovascular system: S1 & S2 heard, RRR. No pedal edema. Gastrointestinal system: Abdomen is nondistended, soft and nontender. Normal  bowel sounds heard. Central nervous system: Alert and oriented. Non focal exam. Speech clear  Extremities: Symmetric in appearance bilaterally  Skin: No rashes, lesions or ulcers on exposed skin  Psychiatry: Judgement and insight appear stable. Mood & affect appropriate.    Data Reviewed: I have personally reviewed following labs and imaging studies  CBC: No results for input(s): WBC, NEUTROABS, HGB, HCT, MCV, PLT in the last 168 hours.  Basic Metabolic Panel: Recent Labs  Lab 09/20/21 0418 09/25/21 1133  NA 132* 135  K 4.7 3.8  CL 100 101  CO2 23 26  GLUCOSE 96 107*  BUN 52* 55*  CREATININE 1.45* 1.51*  CALCIUM 9.8 9.1  MG 2.3  --   PHOS 5.6*  --     GFR: Estimated Creatinine Clearance: 30.4 mL/min (A) (by C-G formula based on SCr of 1.51 mg/dL (H)). Liver Function Tests: Recent Labs  Lab 09/20/21 0418  AST 42*  ALT 57*  ALKPHOS 69  BILITOT 0.2*  PROT 7.5  ALBUMIN 2.8*  2.8*    No results for input(s): LIPASE, AMYLASE in the last 168 hours. No  results for input(s): AMMONIA in the last 168 hours. Coagulation Profile: No results for input(s): INR, PROTIME in the last 168 hours. Cardiac Enzymes: No results for input(s): CKTOTAL, CKMB, CKMBINDEX, TROPONINI in the last 168 hours. BNP (last 3 results) No results for input(s): PROBNP in the last 8760 hours. HbA1C: No results for input(s): HGBA1C in the last 72 hours. CBG: Recent Labs  Lab 09/23/21 1230 09/23/21 1539 09/23/21 2354 09/24/21 0335 09/24/21 0817  GLUCAP 96 89 88 92 91    Lipid Profile: No results for input(s): CHOL, HDL, LDLCALC, TRIG, CHOLHDL, LDLDIRECT in the last 72 hours. Thyroid Function Tests: No results for input(s): TSH, T4TOTAL, FREET4, T3FREE, THYROIDAB in the last 72 hours. Anemia Panel: No results for input(s): VITAMINB12, FOLATE, FERRITIN, TIBC, IRON, RETICCTPCT in the last 72 hours. Sepsis Labs: No results for input(s): PROCALCITON, LATICACIDVEN in the last 168 hours.   No  results found for this or any previous visit (from the past 240 hour(s)).    Radiology Studies: No results found.    Scheduled Meds:  (feeding supplement) PROSource Plus  30 mL Oral BID BM   amLODipine  10 mg Oral Daily   busPIRone  15 mg Oral BID   carvedilol  9.375 mg Oral BID WC   cholecalciferol  1,000 Units Oral Daily   citalopram  10 mg Oral Daily   diclofenac Sodium  4 g Topical QID   docusate sodium  100 mg Oral Daily   feeding supplement (NEPRO CARB STEADY)  237 mL Oral BID BM   folic acid  1 mg Oral Daily   heparin  5,000 Units Subcutaneous Q8H   hydrOXYzine  10 mg Oral TID   lidocaine  1 patch Transdermal Q24H   mouth rinse  15 mL Mouth Rinse BID   multivitamin with minerals  1 tablet Oral Daily   nicotine  14 mg Transdermal Daily   pantoprazole  40 mg Oral Daily   polyethylene glycol  17 g Oral Daily   sevelamer carbonate  800 mg Oral TID WC   thiamine  100 mg Oral Daily   Continuous Infusions:   LOS: 21 days      Time spent: 15 minutes   Dessa Phi, DO [facility] 09/26/2021, 12:06 PM   Available via Epic secure chat 7am-7pm After these hours, please refer to coverage provider listed on [email or web address]

## 2021-09-27 DIAGNOSIS — F10939 Alcohol use, unspecified with withdrawal, unspecified: Secondary | ICD-10-CM | POA: Diagnosis not present

## 2021-09-27 NOTE — TOC Progression Note (Signed)
Transition of Care [facility]) - Progression Note    Patient Details  Name: Sherry Baker MRN: [id number] Date of Birth: November 07, 1965  Transition of Care [facility]) CM/SW [place], RN Phone Number: 09/27/2021, 1:50 PM  Clinical Narrative:    CM spoke with Sherry Baker, cousin on the phone and she has been unable to establish a permanent apartment for the patient at this time, but plans on contacting the [facility] recommended by a community health nurse in the area.  The patient's cousin and grandmother are unable to provide housing for the patient.  I explained to the patient's cousin that the cortrak was removed and patient was ambulating with mobility tech at this time.  SNF placement is no an option due to the patient's recent history of drug and alcohol abuse and that the patient will be discharged once she is able to ambulate safely in the community and is medically stable for discharge.  The patient cousin, Sherry Baker is aware and plans to make arrangements for housing or set the patient up in a temporary hotel/motel setting.   Expected Discharge Plan: Homeless Shelter Barriers to Discharge: Homeless with medical needs, Continued Medical Work up  Expected Discharge Plan and Services Expected Discharge Plan: Homeless Shelter In-house Referral: PCP / Psychologist, educational Discharge Planning Services: CM Consult   Living arrangements for the past 2 months: Homeless                                       Social Determinants of Health (SDOH) Interventions    Readmission Risk Interventions Readmission Risk Prevention Plan 09/24/2021  Transportation Screening Complete  Medication Review Press photographer) Complete  PCP or Specialist appointment within 3-5 days of discharge Complete  HRI or [place] Not Complete  HRI or Home Care Consult Pt Refusal Comments history of unsafe home environment  SW Recovery Care/Counseling Consult Complete  [place] Complete  Some recent data might be hidden

## 2021-09-27 NOTE — Progress Notes (Addendum)
PROGRESS NOTE    Sherry Baker  OIN:[id number] DOB: 1965/12/04 DOA: 09/05/2021 PCP: Default, Provider, MD     Brief Narrative:  Sherry Baker is a 55 year old F with PMH of EtOH abuse, complicated EtOH withdrawal requiring intubation, cocaine use, tobacco use disorder, CKD-3, HTN, depression, bipolar disorder, vitamin D deficiency, vitamin B12 deficiency, rectal cancer s/p resection, chemo and ostomy brought to ED after found to be minimally responsive behind a dumpster surrounded by beer cans.  She was hypotensive with tachycardia, agitation and intermittent somnolence.  She was intubated for airway protection and admitted to ICU for complicated EtOH withdrawal.  Initially treated with Precedex which was discontinued due to bradycardia.  She was a started on fentanyl drip, Librium and Ativan as needed.  She also received empiric antibiotics from 11/4-11/8 with Vanco and Unasyn then on Vanco and CTX presumably for respiratory infection. Eventually extubated on 11/10, and transferred to [facility] service on 11/12.  Encephalopathy improved.  Dysphagia resolved.  Started on regular diet on 11/18.  Monitoring p.o. intake to ensure adequate caloric intake.  Therapy recommended SNF.  She is on DTP list.   New events last 24 hours / Subjective: No acute events reported.   Assessment & Plan:   Active Problems:   Acute alcohol intoxication, with delirium (HCC)   Substance abuse (HCC)   Depression   Severe malnutrition (HCC)   Alcohol abuse   Tobacco abuse   Cocaine abuse ([place])   Bipolar affective disorder (HCC)   Rectal cancer ([place])   Pressure injury of skin   Alcohol withdrawal ([place])   Physical deconditioning   Ambulatory dysfunction   Acute metabolic encephalopathy-likely related to alcohol.  CTH, ammonia, TSH, B12 and RPR unrevealing. No focal neuro deficit to suggest CVA but limited exam. Completed course of high-dose thiamine.  Likely back to her baseline now.   Complicated alcohol withdrawal  requiring intubation and mechanical ventilation Cocaine use disorder -Completed Librium taper and high-dose thiamine as above. -Continue multivitamins, folic acid and thiamine. -TOC consulted for counseling or resources   Dysphagia: Likely from intubation and encephalopathy.  Resolved. -Now on regular diet and cortrak has been removed   Abnormal EKG with ST changes Mildly elevated troponin, 24>54>66 secondary to demand ischemia Chronic diastolic CHF-TTE on 70/9 with LVEF of 55 to 60%, G1 DD, no RWMA Essential hypertension -Continue Coreg, amlodipine   Tobacco use disorder -Encourage cessation  -Nicotine patch   AKI on CKD 3a  -Improved   Vitamin D deficiency -Replace   History of rectal cancer s/p resection w/ostomy -Routine ostomy care   GERD -Continue Protonix   Anxiety/depression/bipolar 1 disorder -Continue BuSpar and Celexa, Atarax, as needed Ativan   RUE swelling/superficial thrombophlebitis -Axillary line was discontinued   Physical deconditioning due to acute illness and chronic alcohol abuse -PT/OT-recommends SNF    Prolonged QT: Resolved on repeat EKG.   Severe malnutrition: as evidenced by low BMI and significant muscle mass and subcu fat loss. Body mass index is 18.55 kg/m. Nutrition Problem: Severe Malnutrition Etiology: chronic illness (cirrhosis, polysubstance abuse) Signs/Symptoms: severe muscle depletion, severe fat depletion Interventions: MVI, Tube feeding, Prostat  DVT prophylaxis:  heparin injection 5,000 Units Start: 09/06/21 0600 SCDs Start: 09/05/21 2126  Code Status: Full code Family Communication: No family at bedside, updated cousin over the phone  Disposition Plan:  Status is: Inpatient  Remains inpatient appropriate because: Awaiting SNF placement versus improvement in mobility to discharge. Per cousin, she has not yet secured housing for patient to  discharge to.   Antimicrobials:  Anti-infectives (From admission, onward)     Start     Dose/Rate Route Frequency Ordered Stop   09/08/21 0830  cefTRIAXone (ROCEPHIN) 2 g in sodium chloride 0.9 % 100 mL IVPB       Note to Pharmacy: Tolerated unasyn   2 g 200 mL/hr over 30 Minutes Intravenous Every 24 hours 09/08/21 0731 09/10/21 0832   09/07/21 1000  vancomycin (VANCOREADY) IVPB 500 mg/100 mL  Status:  Discontinued        500 mg 100 mL/hr over 60 Minutes Intravenous Every 24 hours 09/06/21 1452 09/07/21 1037   09/06/21 1633  Ampicillin-Sulbactam (UNASYN) 3 g in sodium chloride 0.9 % 100 mL IVPB  Status:  Discontinued        3 g 200 mL/hr over 30 Minutes Intravenous Every 8 hours 09/06/21 1258 09/07/21 1037   09/06/21 1545  vancomycin (VANCOREADY) IVPB 1000 mg/200 mL        1,000 mg 200 mL/hr over 60 Minutes Intravenous  Once 09/06/21 1452 09/06/21 1629   09/06/21 0915  Ampicillin-Sulbactam (UNASYN) 3 g in sodium chloride 0.9 % 100 mL IVPB  Status:  Discontinued        3 g 200 mL/hr over 30 Minutes Intravenous Every 12 hours 09/06/21 0817 09/06/21 1258        Objective: Vitals:   09/26/21 1640 09/26/21 2029 09/27/21 0457 09/27/21 0809  BP: 109/62 123/82 129/79 130/72  Pulse: 86 86 83 81  Resp: 18 18 18 18   Temp: 99 F (37.2 C) 98 F (36.7 C) 98.5 F (36.9 C) 99.3 F (37.4 C)  TempSrc: Oral Oral Oral Oral  SpO2: 97% 97% 97% 100%  Weight:   45 kg   Height:        Intake/Output Summary (Last 24 hours) at 09/27/2021 1207 Last data filed at 09/27/2021 0810 Gross per 24 hour  Intake 680 ml  Output --  Net 680 ml    Filed Weights   09/25/21 0553 09/26/21 0500 09/27/21 0457  Weight: 45 kg 44.7 kg 45 kg   Examination: General exam: Appears comfortable   Data Reviewed: I have personally reviewed following labs and imaging studies  CBC: No results for input(s): WBC, NEUTROABS, HGB, HCT, MCV, PLT in the last 168 hours.  Basic Metabolic Panel: Recent Labs  Lab 09/25/21 1133  NA 135  K 3.8  CL 101  CO2 26  GLUCOSE 107*  BUN 55*  CREATININE  1.51*  CALCIUM 9.1    GFR: Estimated Creatinine Clearance: 30.6 mL/min (A) (by C-G formula based on SCr of 1.51 mg/dL (H)). Liver Function Tests: No results for input(s): AST, ALT, ALKPHOS, BILITOT, PROT, ALBUMIN in the last 168 hours.  No results for input(s): LIPASE, AMYLASE in the last 168 hours. No results for input(s): AMMONIA in the last 168 hours. Coagulation Profile: No results for input(s): INR, PROTIME in the last 168 hours. Cardiac Enzymes: No results for input(s): CKTOTAL, CKMB, CKMBINDEX, TROPONINI in the last 168 hours. BNP (last 3 results) No results for input(s): PROBNP in the last 8760 hours. HbA1C: No results for input(s): HGBA1C in the last 72 hours. CBG: Recent Labs  Lab 09/23/21 1230 09/23/21 1539 09/23/21 2354 09/24/21 0335 09/24/21 0817  GLUCAP 96 89 88 92 91    Lipid Profile: No results for input(s): CHOL, HDL, LDLCALC, TRIG, CHOLHDL, LDLDIRECT in the last 72 hours. Thyroid Function Tests: No results for input(s): TSH, T4TOTAL, FREET4, T3FREE, THYROIDAB in the last 72  hours. Anemia Panel: No results for input(s): VITAMINB12, FOLATE, FERRITIN, TIBC, IRON, RETICCTPCT in the last 72 hours. Sepsis Labs: No results for input(s): PROCALCITON, LATICACIDVEN in the last 168 hours.   No results found for this or any previous visit (from the past 240 hour(s)).    Radiology Studies: No results found.    Scheduled Meds:  (feeding supplement) PROSource Plus  30 mL Oral BID BM   amLODipine  10 mg Oral Daily   busPIRone  15 mg Oral BID   carvedilol  9.375 mg Oral BID WC   cholecalciferol  1,000 Units Oral Daily   citalopram  10 mg Oral Daily   diclofenac Sodium  4 g Topical QID   docusate sodium  100 mg Oral Daily   feeding supplement (NEPRO CARB STEADY)  237 mL Oral BID BM   folic acid  1 mg Oral Daily   heparin  5,000 Units Subcutaneous Q8H   hydrOXYzine  10 mg Oral TID   lidocaine  1 patch Transdermal Q24H   mouth rinse  15 mL Mouth Rinse BID    multivitamin with minerals  1 tablet Oral Daily   nicotine  14 mg Transdermal Daily   pantoprazole  40 mg Oral Daily   polyethylene glycol  17 g Oral Daily   sevelamer carbonate  800 mg Oral TID WC   thiamine  100 mg Oral Daily   Continuous Infusions:   LOS: 22 days      Time spent: 10 minutes   Dessa Phi, DO [facility] 09/27/2021, 12:07 PM   Available via Epic secure chat 7am-7pm After these hours, please refer to coverage provider listed on [email or web address]

## 2021-09-27 NOTE — Plan of Care (Signed)
  Problem: Pain Managment: Goal: General experience of comfort will improve Outcome: Progressing   Problem: Safety: Goal: Ability to remain free from injury will improve Outcome: Progressing   

## 2021-09-27 NOTE — Progress Notes (Signed)
Mobility Specialist Progress Note:    09/27/21 1200  Mobility  Activity Ambulated in hall  Level of Assistance Contact guard assist, steadying assist  Assistive Device None  Distance Ambulated (ft) 250 ft  Mobility Out of bed for toileting;Ambulated with assistance in hallway  Mobility Response Tolerated well  Mobility performed by Mobility specialist  $Mobility charge 1 Mobility   Pt asx during ambulation. Required contactG d/t generalized weakness/unsteadiness on feet. Pt c/o bilateral hip pain, which she states is baseline. Pt back in bed eating lunch.  Nelta Numbers Mobility Specialist  Phone [phone]

## 2021-09-27 NOTE — Progress Notes (Signed)
Mobility Specialist Progress Note:   09/27/21 1100  Mobility  Activity Ambulated in room  Level of Assistance Independent  Assistive Device None  Distance Ambulated (ft) 50 ft  Mobility Ambulated independently in room  Mobility Response Tolerated well  Mobility performed by Mobility specialist  $Mobility charge 1 Mobility   Pt wanting to be d/c upon arrival. Agreed to mobility after max encouragement. Impulsively ambulated independently in room, required supervision for safety d/t generalized weakness. Pt back in bed.  Nelta Numbers Mobility Specialist  Phone [phone]

## 2021-09-28 DIAGNOSIS — F10939 Alcohol use, unspecified with withdrawal, unspecified: Secondary | ICD-10-CM | POA: Diagnosis not present

## 2021-09-28 LAB — PHOSPHORUS: Phosphorus: 4.5 mg/dL (ref 2.5–4.6)

## 2021-09-28 NOTE — Progress Notes (Signed)
PROGRESS NOTE    Sherry Baker  HMC:[id number] DOB: October 10, 1966 DOA: 09/05/2021 PCP: Default, Provider, MD     Brief Narrative:  Sherry Baker is a 55 year old F with PMH of EtOH abuse, complicated EtOH withdrawal requiring intubation, cocaine use, tobacco use disorder, CKD-3, HTN, depression, bipolar disorder, vitamin D deficiency, vitamin B12 deficiency, rectal cancer s/p resection, chemo and ostomy brought to ED after found to be minimally responsive behind a dumpster surrounded by beer cans.  She was hypotensive with tachycardia, agitation and intermittent somnolence.  She was intubated for airway protection and admitted to ICU for complicated EtOH withdrawal.  Initially treated with Precedex which was discontinued due to bradycardia.  She was a started on fentanyl drip, Librium and Ativan as needed.  She also received empiric antibiotics from 11/4-11/8 with Vanco and Unasyn then on Vanco and CTX presumably for respiratory infection. Eventually extubated on 11/10, and transferred to [facility] service on 11/12.  Encephalopathy improved.  Dysphagia resolved.  Started on regular diet on 11/18.  Monitoring p.o. intake to ensure adequate caloric intake.  Mobility has been improving.   New events last 24 hours / Subjective: Wants to discharge, states she has somewhere to go but per her cousin, patient will "return to crack house." Maudry Diego is working on getting an apartment set up for patient for discharge.   Assessment & Plan:   Active Problems:   Acute alcohol intoxication, with delirium (HCC)   Substance abuse (HCC)   Depression   Severe malnutrition (HCC)   Alcohol abuse   Tobacco abuse   Cocaine abuse ([place])   Bipolar affective disorder (HCC)   Rectal cancer ([place])   Pressure injury of skin   Alcohol withdrawal ([place])   Physical deconditioning   Ambulatory dysfunction   Acute metabolic encephalopathy-likely related to alcohol.  CTH, ammonia, TSH, B12 and RPR unrevealing. No focal neuro deficit  to suggest CVA but limited exam. Completed course of high-dose thiamine.  Likely back to her baseline now.   Complicated alcohol withdrawal requiring intubation and mechanical ventilation Cocaine use disorder -Completed Librium taper and high-dose thiamine as above. -Continue multivitamins, folic acid and thiamine. -TOC consulted for counseling or resources   Dysphagia: Likely from intubation and encephalopathy.  Resolved. -Now on regular diet and cortrak has been removed   Abnormal EKG with ST changes Mildly elevated troponin, 24>54>66 secondary to demand ischemia Chronic diastolic CHF-TTE on 66/2 with LVEF of 55 to 60%, G1 DD, no RWMA Essential hypertension -Continue Coreg, amlodipine   Tobacco use disorder -Encourage cessation  -Nicotine patch   AKI on CKD 3a  -Improved   Vitamin D deficiency -Replace   History of rectal cancer s/p resection w/ostomy -Routine ostomy care   GERD -Continue Protonix   Anxiety/depression/bipolar 1 disorder -Continue BuSpar and Celexa, Atarax, as needed Ativan   RUE swelling/superficial thrombophlebitis -Axillary line was discontinued   Physical deconditioning due to acute illness and chronic alcohol abuse -PT/OT-recommends SNF    Prolonged QT: Resolved on repeat EKG.   Severe malnutrition: as evidenced by low BMI and significant muscle mass and subcu fat loss. Body mass index is 18.55 kg/m. Nutrition Problem: Severe Malnutrition Etiology: chronic illness (cirrhosis, polysubstance abuse) Signs/Symptoms: severe muscle depletion, severe fat depletion Interventions: MVI, Tube feeding, Prostat  DVT prophylaxis:  heparin injection 5,000 Units Start: 09/06/21 0600 SCDs Start: 09/05/21 2126  Code Status: Full code Family Communication: No family at bedside, updated cousin over the phone 11/25 Disposition Plan:  Status is: Inpatient  Remains  inpatient appropriate because: Per cousin, she has not yet secured housing for patient to  discharge to.   Antimicrobials:  Anti-infectives (From admission, onward)    Start     Dose/Rate Route Frequency Ordered Stop   09/08/21 0830  cefTRIAXone (ROCEPHIN) 2 g in sodium chloride 0.9 % 100 mL IVPB       Note to Pharmacy: Tolerated unasyn   2 g 200 mL/hr over 30 Minutes Intravenous Every 24 hours 09/08/21 0731 09/10/21 0832   09/07/21 1000  vancomycin (VANCOREADY) IVPB 500 mg/100 mL  Status:  Discontinued        500 mg 100 mL/hr over 60 Minutes Intravenous Every 24 hours 09/06/21 1452 09/07/21 1037   09/06/21 1633  Ampicillin-Sulbactam (UNASYN) 3 g in sodium chloride 0.9 % 100 mL IVPB  Status:  Discontinued        3 g 200 mL/hr over 30 Minutes Intravenous Every 8 hours 09/06/21 1258 09/07/21 1037   09/06/21 1545  vancomycin (VANCOREADY) IVPB 1000 mg/200 mL        1,000 mg 200 mL/hr over 60 Minutes Intravenous  Once 09/06/21 1452 09/06/21 1629   09/06/21 0915  Ampicillin-Sulbactam (UNASYN) 3 g in sodium chloride 0.9 % 100 mL IVPB  Status:  Discontinued        3 g 200 mL/hr over 30 Minutes Intravenous Every 12 hours 09/06/21 0817 09/06/21 1258        Objective: Vitals:   09/27/21 1500 09/27/21 2053 09/28/21 0434 09/28/21 0714  BP: 131/84 126/84 (!) 141/88 (!) 145/87  Pulse: 95 87 87 84  Resp: 20 18 17 16   Temp: 98.4 F (36.9 C) 98 F (36.7 C) 98.4 F (36.9 C) 98.6 F (37 C)  TempSrc: Oral Oral Oral Oral  SpO2: 98% 98% 98% 100%  Weight:      Height:        Intake/Output Summary (Last 24 hours) at 09/28/2021 1226 Last data filed at 09/27/2021 2300 Gross per 24 hour  Intake 480 ml  Output 100 ml  Net 380 ml    Filed Weights   09/25/21 0553 09/26/21 0500 09/27/21 0457  Weight: 45 kg 44.7 kg 45 kg   Examination: General exam: Appears comfortable   Data Reviewed: I have personally reviewed following labs and imaging studies  CBC: No results for input(s): WBC, NEUTROABS, HGB, HCT, MCV, PLT in the last 168 hours.  Basic Metabolic Panel: Recent Labs   Lab 09/25/21 1133 09/28/21 0057  NA 135  --   K 3.8  --   CL 101  --   CO2 26  --   GLUCOSE 107*  --   BUN 55*  --   CREATININE 1.51*  --   CALCIUM 9.1  --   PHOS  --  4.5    GFR: Estimated Creatinine Clearance: 30.6 mL/min (A) (by C-G formula based on SCr of 1.51 mg/dL (H)). Liver Function Tests: No results for input(s): AST, ALT, ALKPHOS, BILITOT, PROT, ALBUMIN in the last 168 hours.  No results for input(s): LIPASE, AMYLASE in the last 168 hours. No results for input(s): AMMONIA in the last 168 hours. Coagulation Profile: No results for input(s): INR, PROTIME in the last 168 hours. Cardiac Enzymes: No results for input(s): CKTOTAL, CKMB, CKMBINDEX, TROPONINI in the last 168 hours. BNP (last 3 results) No results for input(s): PROBNP in the last 8760 hours. HbA1C: No results for input(s): HGBA1C in the last 72 hours. CBG: Recent Labs  Lab 09/23/21 1230 09/23/21 1539 09/23/21  2354 09/24/21 0335 09/24/21 0817  GLUCAP 96 89 88 92 91    Lipid Profile: No results for input(s): CHOL, HDL, LDLCALC, TRIG, CHOLHDL, LDLDIRECT in the last 72 hours. Thyroid Function Tests: No results for input(s): TSH, T4TOTAL, FREET4, T3FREE, THYROIDAB in the last 72 hours. Anemia Panel: No results for input(s): VITAMINB12, FOLATE, FERRITIN, TIBC, IRON, RETICCTPCT in the last 72 hours. Sepsis Labs: No results for input(s): PROCALCITON, LATICACIDVEN in the last 168 hours.   No results found for this or any previous visit (from the past 240 hour(s)).    Radiology Studies: No results found.    Scheduled Meds:  (feeding supplement) PROSource Plus  30 mL Oral BID BM   amLODipine  10 mg Oral Daily   busPIRone  15 mg Oral BID   carvedilol  9.375 mg Oral BID WC   cholecalciferol  1,000 Units Oral Daily   citalopram  10 mg Oral Daily   diclofenac Sodium  4 g Topical QID   docusate sodium  100 mg Oral Daily   feeding supplement (NEPRO CARB STEADY)  237 mL Oral BID BM   folic acid  1  mg Oral Daily   heparin  5,000 Units Subcutaneous Q8H   hydrOXYzine  10 mg Oral TID   lidocaine  1 patch Transdermal Q24H   mouth rinse  15 mL Mouth Rinse BID   multivitamin with minerals  1 tablet Oral Daily   nicotine  14 mg Transdermal Daily   pantoprazole  40 mg Oral Daily   polyethylene glycol  17 g Oral Daily   sevelamer carbonate  800 mg Oral TID WC   thiamine  100 mg Oral Daily   Continuous Infusions:   LOS: 23 days      Time spent: 10 minutes   Dessa Phi, DO [facility] 09/28/2021, 12:26 PM   Available via Epic secure chat 7am-7pm After these hours, please refer to coverage provider listed on [email or web address]

## 2021-09-28 NOTE — Progress Notes (Signed)
Physical Therapy Treatment Patient Details Name: Sherry Baker MRN: [id number] DOB: Jul 25, 1966 Today's Date: 09/28/2021   History of Present Illness Pt is a 55 y/o female brought to ED after found to be minimally responsive behind a dumpster surrounded by beer cans.  She was hypotensive with tachycardia, agitation and intermittent somnolence.  She was intubated for airway protection and admitted to ICU for complicated EtOH withdrawal.  Initially treated with Precedex which was discontinued due to bradycardia.  She was a started on fentanyl drip, Librium and Ativan as needed.  She also received empiric antibiotics from 11/4-11/8 with Vanco and Unasyn then on Vanco and CTX presumably for respiratory infection. Eventually extubated on 11/10, and transferred to [facility] service on 11/12.  She is on TF via cortrak for dysphagia. PMH including but not limited to EtOH abuse, complicated EtOH withdrawal requiring intubation, cocaine use, tobacco use disorder, CKD-3, HTN, depression, bipolar disorder, vitamin D deficiency, vitamin B12 deficiency, rectal cancer s/p resection, chemo and ostomy.    PT Comments    Pt  expresses desire to d/c home today, agreeable to session focused on gait training, balance challenge, and mobility progression. Pt ambulatory >300 ft with supervision level assist only, requires x1 seated rest break and is mildly unsteady but PT feels this is chronic given history of rectal cancer/associated pain and chronic bilat hip pain. PT messaged case management and MD about pt's desire to d/c home to her apt that she shares with her roommate, pt states "it's my choice where I live...my cousin doesn't like it". PT to continue to follow while acute, recommendations for follow up listed below.    Recommendations for follow up therapy are one component of a multi-disciplinary discharge planning process, led by the attending physician.  Recommendations may be updated based on patient status, additional  functional criteria and insurance authorization.  Follow Up Recommendations  Outpatient PT     Assistance Recommended at Discharge Frequent or constant Supervision/Assistance  Equipment Recommendations  Rolling walker (2 wheels) (for hip pain, worse some days than others)    Recommendations for Other Services       Precautions / Restrictions Precautions Precautions: Fall Precaution Comments: colostomy     Mobility  Bed Mobility Overal bed mobility: Needs Assistance Bed Mobility: Supine to Sit;Sit to Supine     Supine to sit: Modified independent (Device/Increase time) Sit to supine: Modified independent (Device/Increase time)   General bed mobility comments: mod I for increased time and HOB elevation, no physical assist.    Transfers Overall transfer level: Needs assistance Equipment used: None Transfers: Sit to/from Stand Sit to Stand: Supervision           General transfer comment: for safety, no physical assist. STS x3 from EOB and toilet.    Ambulation/Gait Ambulation/Gait assistance: Supervision;Min guard Gait Distance (Feet): 350 Feet Assistive device: None Gait Pattern/deviations: Decreased stride length;Shuffle;Trunk flexed;Step-through pattern;Narrow base of support;Drifts right/left Gait velocity: decr     General Gait Details: initially min guard, transitioning to supervision for safety. Cues for rest breaks as needed, has mild unsteadiness but no overt LOB.   Stairs             Wheelchair Mobility    Modified Rankin (Stroke Patients Only)       Balance Overall balance assessment: Needs assistance Sitting-balance support: Feet supported;No upper extremity supported Sitting balance-Leahy Scale: Good     Standing balance support: During functional activity;No upper extremity supported Standing balance-Leahy Scale: Fair  High Level Balance Comments: challenged balance with large steps, fast/slow gait,  direction changes. No LOB            Cognition Arousal/Alertness: Awake/alert Behavior During Therapy: Flat affect Overall Cognitive Status: No family/caregiver present to determine baseline cognitive functioning                                          Exercises      General Comments        Pertinent Vitals/Pain Pain Assessment: Faces Faces Pain Scale: Hurts little more Pain Location: rectum, bilat hips Pain Descriptors / Indicators: Grimacing;Sore Pain Intervention(s): Limited activity within patient's tolerance;Monitored during session;Repositioned    Home Living                          Prior Function            PT Goals (current goals can now be found in the care plan section) Acute Rehab PT Goals PT Goal Formulation: With patient Time For Goal Achievement: 09/29/21 Potential to Achieve Goals: Fair Progress towards PT goals: Progressing toward goals    Frequency    Min 3X/week      PT Plan Current plan remains appropriate    Co-evaluation              AM-PAC PT "6 Clicks" Mobility   Outcome Measure  Help needed turning from your back to your side while in a flat bed without using bedrails?: None Help needed moving from lying on your back to sitting on the side of a flat bed without using bedrails?: None Help needed moving to and from a bed to a chair (including a wheelchair)?: A Little Help needed standing up from a chair using your arms (e.g., wheelchair or bedside chair)?: A Little Help needed to walk in hospital room?: A Little Help needed climbing 3-5 steps with a railing? : A Little 6 Click Score: 20    End of Session Equipment Utilized During Treatment: Gait belt Activity Tolerance: Patient limited by fatigue Patient left: in bed;with call bell/phone within reach;with bed alarm set Nurse Communication: Mobility status PT Visit Diagnosis: Other abnormalities of gait and mobility (R26.89)     Time:  0981-1914 PT Time Calculation (min) (ACUTE ONLY): 14 min  Charges:  $Gait Training: 8-22 mins                     Stacie Glaze, PT DPT Acute Rehabilitation Services Pager [phone]  Office [phone]    Roxine Caddy E Ruffin Pyo 09/28/2021, 12:58 PM

## 2021-09-29 ENCOUNTER — Other Ambulatory Visit: Payer: Self-pay

## 2021-09-29 DIAGNOSIS — F10921 Alcohol use, unspecified with intoxication delirium: Secondary | ICD-10-CM | POA: Diagnosis not present

## 2021-09-29 MED ORDER — COVID-19 MRNA VACC (MODERNA) 100 MCG/0.5ML IM SUSP
0.5000 mL | Freq: Once | INTRAMUSCULAR | Status: AC
  Administered 2021-09-30: 13:00:00 0.5 mL via INTRAMUSCULAR
  Filled 2021-09-29: qty 0.5

## 2021-09-29 MED ORDER — INFLUENZA VAC A&B SA ADJ QUAD 0.5 ML IM PRSY
0.5000 mL | PREFILLED_SYRINGE | INTRAMUSCULAR | Status: AC
  Administered 2021-09-30: 10:00:00 0.5 mL via INTRAMUSCULAR
  Filled 2021-09-29: qty 0.5

## 2021-09-29 NOTE — Progress Notes (Signed)
Subjective: Lying comfortably in bed-no chest pain or shortness of breath.  Inquiring about discharge plans.  Objective: Blood pressure 128/80, pulse 97, temperature 98.3 F (36.8 C), temperature source Oral, resp. rate 17, height 5\' 2"  (1.575 m), weight 45 kg, SpO2 98 %.   Assessment/plan: Acute metabolic encephalopathy: Felt to be related to alcohol use-has resolved.  Alcohol withdrawal/DTs: Resolved  Acute hypoxic respiratory failure requiring mechanical ventilation in the setting of complicated alcohol withdrawal: Hypoxia has resolved-she is on room air.  Dysphagia: No longer on NG tube feedings-on regular diet.  AKI on CKD stage IIIa: Back to baseline-monitor periodically.  HTN: BP stable-continue Coreg/amlodipine  Total time spent:15 min

## 2021-09-29 NOTE — Plan of Care (Signed)

## 2021-09-30 ENCOUNTER — Other Ambulatory Visit (HOSPITAL_COMMUNITY): Payer: Self-pay

## 2021-09-30 DIAGNOSIS — F1410 Cocaine abuse, uncomplicated: Secondary | ICD-10-CM | POA: Diagnosis not present

## 2021-09-30 DIAGNOSIS — F10921 Alcohol use, unspecified with intoxication delirium: Secondary | ICD-10-CM | POA: Diagnosis not present

## 2021-09-30 DIAGNOSIS — J9601 Acute respiratory failure with hypoxia: Secondary | ICD-10-CM | POA: Diagnosis not present

## 2021-09-30 DIAGNOSIS — F10939 Alcohol use, unspecified with withdrawal, unspecified: Secondary | ICD-10-CM | POA: Diagnosis not present

## 2021-09-30 MED ORDER — CARVEDILOL 3.125 MG PO TABS: 9.3750 mg | ORAL_TABLET | Freq: Two times a day (BID) | ORAL | 3 refills | Status: DC

## 2021-09-30 MED ORDER — TRAMADOL HCL 50 MG PO TABS: 50.0000 mg | ORAL_TABLET | Freq: Four times a day (QID) | ORAL | 0 refills | Status: DC | PRN

## 2021-09-30 MED ORDER — VITAMIN D3 25 MCG PO TABS: 1000.0000 [IU] | ORAL_TABLET | Freq: Every day | ORAL | 3 refills | Status: DC

## 2021-09-30 MED ORDER — DICLOFENAC SODIUM 1 % EX GEL
4.0000 g | Freq: Four times a day (QID) | CUTANEOUS | 1 refills | Status: DC
Start: 2021-09-30 — End: 2021-09-30

## 2021-09-30 MED ORDER — VITAMIN D3 25 MCG PO TABS
1000.0000 [IU] | ORAL_TABLET | Freq: Every day | ORAL | 3 refills | Status: DC
Start: 2021-09-30 — End: 2021-12-13
  Filled 2021-09-30: qty 30, 30d supply, fill #0

## 2021-09-30 MED ORDER — DICLOFENAC SODIUM 1 % EX GEL
4.0000 g | Freq: Four times a day (QID) | CUTANEOUS | 1 refills | Status: DC
  Filled 2021-09-30: qty 400, 28d supply, fill #0

## 2021-09-30 MED ORDER — ACETAMINOPHEN 325 MG PO TABS
650.0000 mg | ORAL_TABLET | Freq: Four times a day (QID) | ORAL | Status: DC | PRN
Start: 2021-09-30 — End: 2021-12-15

## 2021-09-30 MED ORDER — CARVEDILOL 3.125 MG PO TABS
9.3750 mg | ORAL_TABLET | Freq: Two times a day (BID) | ORAL | 3 refills | Status: DC
  Filled 2021-09-30: qty 180, 30d supply, fill #0

## 2021-09-30 MED ORDER — TRAMADOL HCL 50 MG PO TABS
50.0000 mg | ORAL_TABLET | Freq: Four times a day (QID) | ORAL | 0 refills | Status: DC | PRN
  Filled 2021-09-30: qty 30, 7d supply, fill #0
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
## 2021-09-30 NOTE — Discharge Summary (Signed)
Physician Discharge Summary  Sherry Baker OFB:[id number] DOB: 07/08/1966 DOA: 09/05/2021  PCP: Argentina Donovan, PA-C  Admit date: 09/05/2021 Discharge date: 09/30/2021  Time spent: 35 minutes  Recommendations for Outpatient Follow-up:  Patient will need to follow-up with [facility] on recert at 7:82 PM Patient will also need to follow-up with the [facility] outpatient [facility] center on [facility].  Referral faxed.  Patient will need to call for an appointment Patient will require a rolling walker after discharge.  This will be provided by [facility] Patient will be given an influenza vaccine as well as a Moderna COVID-19 vaccine prior to discharge   Discharge Diagnoses:  Active Problems:   Acute alcohol intoxication, with delirium (HCC)   Substance abuse (HCC)   Depression   Severe malnutrition (HCC)   Alcohol abuse   Tobacco abuse   Cocaine abuse ([place])   Bipolar affective disorder (HCC)   Rectal cancer ([place])   Pressure injury of skin   Alcohol withdrawal ([place])   Physical deconditioning   Ambulatory dysfunction    Discharge Condition: Stable  Diet recommendation: Regular  Filed Weights   09/25/21 0553 09/26/21 0500 09/27/21 0457  Weight: 45 kg 44.7 kg 45 kg    History of present illness:  55 year old F with PMH of EtOH abuse, complicated EtOH withdrawal requiring intubation, cocaine use, tobacco use disorder, CKD-3, HTN, depression, bipolar disorder, vitamin D deficiency, vitamin B12 deficiency, rectal cancer s/p resection, chemo and ostomy brought to ED after found to be minimally responsive behind a dumpster surrounded by beer cans.  She was hypotensive with tachycardia, agitation and intermittent somnolence.  She was intubated for airway protection and admitted to ICU for complicated EtOH withdrawal.  Initially treated with Precedex which was discontinued due to bradycardia.  She was a started on  fentanyl drip, Librium and Ativan as needed.  She also received empiric antibiotics from 11/4-11/8 with Vanco and Unasyn then on Vanco and CTX presumably for respiratory infection. Eventually extubated on 11/10, and transferred to [facility] service on 11/12.  Encephalopathy improved.  Dysphagia resolved.  Started on regular diet on 11/18  Hospital Course:  Acute metabolic encephalopathy Resolved Related to alcohol.   Completed course of high-dose thiamine.   Complicated alcohol withdrawal requiring intubation and mechanical ventilation/Cocaine use disorder Completed Librium taper and high-dose thiamine as above. Continue multivitamins and thiamine after discharge. TOC provided information regarding substance abuse treatment.  Patient accepted.   Dysphagia:  Resolved; transient secondary to altered mentation and recent intubation Tolerating regular solid diet   Abnormal EKG with ST changes Mildly elevated troponin, 24>54>66 secondary to demand ischemia Chronic diastolic CHF-TTE on 42/3 with LVEF of 55 to 60%, G1 DD, no RWMA Essential hypertension Continue Coreg and amlodipine after discharge   Tobacco use disorder -Encouraged cessation -successful utilization of nicotine patch during hospitalization   AKI on CKD 3a  On 11/23 BUN 55 and creatinine 1.51 with a GFR of 41 Baseline renal function creatinine between 1.4 and 1.7 with a BUN in the 20s   Vitamin D deficiency Vitamin D level during this hospitalization was 21.16 Continue  PO replacement after discharge   History of rectal cancer s/p resection w/ostomy Routine ostomy care   GERD Continue PPI   Anxiety/depression/bipolar 1 disorder Continue BuSpar and Celexa, and Vistaril   RUE swelling/superficial thrombophlebitis Axillary line was discontinued   Physical deconditioning/ambulatory dysfunction due to acute illness and chronic alcohol abuse Ambulating appropriately with rolling  walker.  Will be dispensed a rolling walker  prior to discharge    Prolonged QT:  Resolved on repeat EKG.   Severe malnutrition: as evidenced by low BMI and significant muscle mass and subcu fat loss. Body mass index is 18.55 kg/m.  With most recent weight 99.2 pounds on 11/25 Nutrition Problem: Severe Malnutrition Etiology: chronic illness (cirrhosis, polysubstance abuse) Signs/Symptoms: severe muscle depletion, severe fat depletion Interventions: MVI, Tube feeding, Prostat     Procedures: Echocardiogram Cortrack tube placement  Consultations: PCCM  Antibiotics: Unasyn 11/4 and 11/5 Vancomycin x1 dose 11/ Ceftriaxone 11/6 through 11/08  Discharge Exam: Vitals:   09/30/21 0543 09/30/21 0740  BP: 131/89 (!) 137/91  Pulse: 91 91  Resp: 17 16  Temp: 98.5 F (36.9 C) 97.8 F (36.6 C)  SpO2: 100% 98%   Constitutional: NAD, calm, comfortable Respiratory: clear to auscultation bilaterally, room air, normal respiratory effort.  Cardiovascular: Regular rate and rhythm, no murmurs / rubs / gallops. No extremity edema.  Abdomen: no tenderness, no masses palpated. Bowel sounds positive. LBM 11/25 Neurologic: CN 2-12 grossly intact. Sensation intact, DTR normal. Strength 4/5 x all 4 extremities.  Psychiatric: Alert and oriented x 3. Normal mood.    Discharge Instructions   Discharge Instructions     (HEART FAILURE PATIENTS) Call MD:  Anytime you have any of the following symptoms: 1) 3 pound weight gain in 24 hours or 5 pounds in 1 week 2) shortness of breath, with or without a dry hacking cough 3) swelling in the hands, feet or stomach 4) if you have to sleep on extra pillows at night in order to breathe.   Complete by: As directed    Ambulatory referral to Physical Therapy   Complete by: As directed    Iontophoresis - 4 mg/ml of dexamethasone: No   T.E.N.S. Unit Evaluation and Dispense as Indicated: No   Call MD for:  extreme fatigue   Complete by: As directed    Call MD for:  persistant dizziness or  light-headedness   Complete by: As directed    Call MD for:  persistant nausea and vomiting   Complete by: As directed    Call MD for:  temperature >100.4   Complete by: As directed    Diet general   Complete by: As directed    Discharge instructions   Complete by: As directed    Do not drink alcoholic beverages or utilize illicit drugs/substances.  Please refer to materials provided by case management team if you are interested in outpatient substance abuse treatment  Please continue your current efforts to stop smoking  Take all medication as prescribed and follow-up with all scheduled physician appointments.   Increase activity slowly   Complete by: As directed    No wound care   Complete by: As directed       Allergies as of 09/30/2021       Reactions   Penicillins Hives, Swelling   Tolerated Unasyn, Rocephin Did it involve swelling of the face/tongue/throat, SOB, or low BP? Yes Did it involve sudden or severe rash/hives, skin peeling, or any reaction on the inside of your mouth or nose? No Did you need to seek medical attention at a hospital or doctor's office? No When did it last happen?  <10 years   If all above answers are "NO", may proceed with cephalosporin use.        Medication List     STOP taking these medications    cloNIDine 0.1  MG tablet Commonly known as: CATAPRES       TAKE these medications    acetaminophen 325 MG tablet Commonly known as: TYLENOL Take 2 tablets (650 mg total) by mouth every 6 (six) hours as needed for mild pain, headache or fever.   amLODipine 10 MG tablet Commonly known as: NORVASC Take 1 tablet (10 mg total) by mouth daily.   busPIRone 15 MG tablet Commonly known as: BUSPAR Take 1 tablet (15 mg total) by mouth 2 (two) times daily.   carvedilol 3.125 MG tablet Commonly known as: COREG Take 3 tablets (9.375 mg total) by mouth 2 (two) times daily with a meal.   citalopram 10 MG tablet Commonly known as:  CELEXA Take 1 tablet (10 mg total) by mouth daily.   diclofenac Sodium 1 % Gel Commonly known as: VOLTAREN Apply 4 g topically 4 (four) times daily.   gabapentin 100 MG capsule Commonly known as: NEURONTIN Take 1 capsule (100 mg total) by mouth 3 (three) times daily.   hydrOXYzine 10 MG tablet Commonly known as: ATARAX/VISTARIL Take 1 tablet (10 mg total) by mouth 3 (three) times daily as needed. As needed for acute anxiety   multivitamin with minerals Tabs tablet Place 1 tablet into feeding tube daily. What changed: how to take this   omeprazole 40 MG capsule Commonly known as: PRILOSEC Take 1 capsule (40 mg total) by mouth daily.   thiamine 100 MG tablet Take 1 tablet (100 mg total) by mouth daily.   traMADol 50 MG tablet Commonly known as: ULTRAM Take 1 tablet (50 mg total) by mouth every 6 (six) hours as needed for moderate pain.   Vitamin D3 25 MCG tablet Commonly known as: Vitamin D Take 1 tablet (1,000 Units total) by mouth daily.               Durable Medical Equipment  (From admission, onward)           Start     Ordered   09/30/21 0853  For home use only DME Walker rolling  Once       Question Answer Comment  Walker: With 5 Inch Wheels   Patient needs a walker to treat with the following condition Hip pain      09/30/21 0853           Allergies  Allergen Reactions   Penicillins Hives and Swelling    Tolerated Unasyn, Rocephin Did it involve swelling of the face/tongue/throat, SOB, or low BP? Yes Did it involve sudden or severe rash/hives, skin peeling, or any reaction on the inside of your mouth or nose? No Did you need to seek medical attention at a hospital or doctor's office? No When did it last happen?  <10 years   If all above answers are "NO", may proceed with cephalosporin use.     Follow-up Information     [place]. Go on 11/05/2021.   Why: You are scheduled for a hospital follow up on November 05, 2021 at 2:30 pm. Contact information: [place] [address] [phone]        [facility] Follow up.   Why: [employer] will be providing you with a rolling walker before you are discharged home today. Contact information: [address] [address] [phone]         [facility] Call.   Specialty: Rehabilitation Why: Please call and follow up regarding  referral for Outpatient physical therapy. Contact information: [address] [phone] [facility] [place] [phone]                 The results of significant diagnostics from this hospitalization (including imaging, microbiology, ancillary and laboratory) are listed below for reference.    Significant Diagnostic Studies: DG Abd 1 View  Result Date: 09/06/2021 CLINICAL DATA:  Orogastric tube advancement EXAM: ABDOMEN - 1 VIEW COMPARISON:  04/02/2021.  Chest x-ray 09/06/2021 FINDINGS: NG tube extends into the stomach with the tip in the distal body of the stomach. Normal bowel gas pattern without bowel dilatation. IMPRESSION: NG tube advanced with the tip in the distal body of the stomach. Electronically Signed   By: Franchot Gallo M.D.   On: 09/06/2021 11:19   CT Head Wo Contrast  Result Date: 09/05/2021 CLINICAL DATA:  clinically intoxicated, level 5 caveat secondary to altered mental status EXAM: CT HEAD WITHOUT CONTRAST TECHNIQUE: Contiguous axial images were obtained from the base of the skull through the vertex without intravenous contrast. COMPARISON:  CT head 06/23/2021 BRAIN: BRAIN Patchy and confluent areas of decreased attenuation are noted throughout the deep and periventricular white matter of the cerebral hemispheres bilaterally, compatible with chronic microvascular ischemic disease. No evidence of large-territorial acute infarction. No parenchymal hemorrhage. No mass  lesion. No extra-axial collection. No mass effect or midline shift. No hydrocephalus. Basilar cisterns are patent. Vascular: No hyperdense vessel. Atherosclerotic calcifications are present within the cavernous internal carotid arteries. Skull: No acute fracture or focal lesion. Sinuses/Orbits: Mucosal thickening of bilateral maxillary sinuses. Paranasal sinuses and mastoid air cells are clear. The orbits are unremarkable. Other: None. IMPRESSION: 1. No acute intracranial abnormality. 2. Bilateral maxillary sinus disease. Electronically Signed   By: Iven Finn M.D.   On: 09/05/2021 17:15   DG Chest Port 1 View  Result Date: 09/09/2021 CLINICAL DATA:  Respiratory failure EXAM: PORTABLE CHEST 1 VIEW COMPARISON:  Chest x-ray 09/06/2021 FINDINGS: Endotracheal tube tip is 2.9 cm above the carina. Enteric tube tip is below the diaphragm. Heart size is normal. Mediastinum is stable. Hazy opacities in the bilateral lung bases which are mildly increased since previous study. No large pleural effusion visualized. No pneumothorax. IMPRESSION: 1. Medical devices as described. 2. Mildly increased hazy opacities in the lung bases which may represent atelectasis and/or infiltrate. Electronically Signed   By: Ofilia Neas M.D.   On: 09/09/2021 07:35   DG Chest Port 1 View  Result Date: 09/06/2021 CLINICAL DATA:  55 year old female for evaluation post intubation in the setting of respiratory failure. EXAM: PORTABLE CHEST 1 VIEW COMPARISON:  September 05, 2021. FINDINGS: Endotracheal tube in the midtrachea proximally 3.7 cm from the carina, within 2-3 mm of previous location. Gastric tube courses through in off the field of the radiograph. Cardiomediastinal contours and hilar structures are normal. Lungs are well inflated. No lobar consolidative process. No sign of pleural effusion or pneumothorax. Increased density in the retrocardiac region. Nodular density projects over the RIGHT lower lobe not evident on the  prior/recent chest radiograph. This measures up to 22 mm. On limited assessment there is no acute skeletal process. IMPRESSION: New nodular density projecting over the RIGHT lung base. This may be related to confluence of shadows or even something external to the patient but this is not clear. Suggest PA and lateral chest radiograph on follow-up when the patient is able. Increasing density in the retrocardiac region may represent developing consolidation. Stable appearance of support devices.  Is Electronically Signed   By: Zetta Bills M.D.   On: 09/06/2021 07:58   DG CHEST PORT 1 VIEW  Result Date: 09/05/2021 CLINICAL DATA:  Intubation. EXAM: PORTABLE CHEST 1 VIEW COMPARISON:  Chest radiograph 06/22/2021 FINDINGS: Endotracheal tube tip is 4.1 cm from the carina. Enteric tube is in place with tip below the diaphragm in the stomach. Side port is in the region of the gastroesophageal junction. The heart is normal in size. Normal mediastinal contours allowing for mild rotation central bronchial thickening with mild bibasilar atelectasis. No pneumothorax or large pleural effusion. Left greater than right cervical ribs. No acute osseous findings. IMPRESSION: 1. Endotracheal tube tip 4.1 cm from the carina. Enteric tube tip below the diaphragm in the stomach, side port in the region of the gastroesophageal junction. Advancement of 2-3 cm would place the side-port below the diaphragm. 2. Mild central bronchial thickening with mild bibasilar atelectasis. Electronically Signed   By: Keith Rake M.D.   On: 09/05/2021 22:20   DG Abd Portable 1V  Result Date: 09/13/2021 CLINICAL DATA:  Feeding tube placement EXAM: PORTABLE ABDOMEN - 1 VIEW COMPARISON:  X-ray abdomen 09/06/2021 FINDINGS: Left trace pleural effusion. Enteric tube with tip overlying the expected region of the gastric antrum/first portion of the duodenum. The bowel gas pattern is normal. No radio-opaque calculi or other significant radiographic  abnormality are seen. IMPRESSION: 1. Enteric tube with tip overlying the expected region of the gastric antrum/first portion of the duodenum. 2. Left trace pleural effusion. Electronically Signed   By: Iven Finn M.D.   On: 09/13/2021 15:31   DG Swallowing Func-Speech Pathology  Result Date: 09/20/2021 Table formatting from the original result was not included. Objective Swallowing Evaluation: Type of Study: MBS-Modified Barium Swallow Study  Patient Details Name: ZAILYNN BRANDEL MRN: [id number] Date of Birth: 06/24/1966 Today's Date: 09/20/2021 Time: SLP Start Time (ACUTE ONLY): 1335 -SLP Stop Time (ACUTE ONLY): 1350 SLP Time Calculation (min) (ACUTE ONLY): 15 min Past Medical History: Past Medical History: Diagnosis Date  Alcohol abuse   Allergy   PCNS swelling  Arthritis   Bipolar 1 disorder ([place])   Cancer ([place]) 01/21/2017  rectal cancer  Cancer ([place])   Cirrhosis of liver ([place])   Depression   Genetic testing 03/24/2017  Ms. Genna underwent genetic counseling and testing for hereditary cancer syndromes on 02/17/2017. Her results were negative for mutations in all 46 genes analyzed by [facility]'s 46-gene Common Hereditary Cancers Panel. Genes analyzed include: APC, ATM, AXIN2, BARD1, BMPR1A, BRCA1, BRCA2, BRIP1, CDH1, CDKN2A, CHEK2, CTNNA1, DICER1, EPCAM, GREM1, HOXB13, KIT, MEN1, MLH1, MSH2, MSH3, MSH6, MUTYH, NBN,  Hypertension  Past Surgical History: Past Surgical History: Procedure Laterality Date  ABDOMINAL PERINEAL BOWEL RESECTION N/A 06/18/2017  Procedure: ABDOMINAL PERINEAL RESECTION ERAS PATHWAY;  Surgeon: Leighton Ruff, MD;  Location: [facility];  Service: General;  Laterality: N/A;  COLON SURGERY    COLONOSCOPY WITH PROPOFOL Left 01/21/2017  Procedure: COLONOSCOPY WITH PROPOFOL;  Surgeon: Otis Brace, MD;  Location: [facility];  Service: Gastroenterology;  Laterality: Left;  FRACTURE SURGERY    MANDIBLE FRACTURE SURGERY   HPI: 55 yo F admitted to the hospital for cocaine intoxication and etoh  withdrawal. In the ED the pt was exhibiting hypertension, tachycardia and reportedly intermittent agitation so received 49m of geodon and 213mof ativan. Pt was intermittently somnolent prior to and after the meds in the ED. PCCM consulted for evaluation and pt was found to not be able to protect her airway, subsequently was  intubated and admitted. CXR 11/7 concerning for BLL opacities. Pt with ETT 11/3-11/10. Pt is known to this service from prior admissions.  She was unable to participate in assessment in May of this year.  In August 2020 pt was seen at bedside with recommendations for mechanical soft solids and thin liquid.  Pt with prior medical history of ETOH use disorder, complicated etoh withdrawals requiring prior intubation; Cocaine abuse; HTN; CKD3; GERD; Bipolar 1; Depression; Tobacco dependence; Rectal cancer s/p resection, chemo, and s/p ostomy; BSE completed 09/13/21 with recommendation for NPO with potential temporary non-oral feeding; Cortrak placed on 09/13/21.  SLE f/u for PO readiness.  Subjective: alert, cooperative, eager to eat and drink  Recommendations for follow up therapy are one component of a multi-disciplinary discharge planning process, led by the attending physician.  Recommendations may be updated based on patient status, additional functional criteria and insurance authorization. Assessment / Plan / Recommendation Clinical Impressions 09/20/2021 Clinical Impression Pt has piecemeal swallowing with thin liquids with swallow initiated consistently at the pyriform sinuses. She maintains good airway protection with trace penetration that occurs during the swallow with consecutive straw sips (PAS #2). This is still functional given that penetrates clear spontaneously, but could perhaps be improved further by removal of cortrak too as the epiglottis does appear to make contact with it, perhaps not inverting to its full potential. Oropharyngeal swallow is [facility] with purees and solids with  adequate mastication even with reduced dentition. Recommend regular solids and thin liquids. Pt was educated on use of general aspiration precautions as well. Considering that her mentation has been fluctuating and positioning was reduced at bedside, recommend brief f/u for safety without longer term needs anticipated.  SLP Visit Diagnosis Dysphagia, unspecified (R13.10) Attention and concentration deficit following -- Frontal lobe and executive function deficit following -- Impact on safety and function Mild aspiration risk   Treatment Recommendations 09/20/2021 Treatment Recommendations Therapy as outlined in treatment plan below   Prognosis 09/20/2021 Prognosis for Safe Diet Advancement Good Barriers to Reach Goals Cognitive deficits Barriers/Prognosis Comment -- Diet Recommendations 09/20/2021 SLP Diet Recommendations Regular solids;Thin liquid Liquid Administration via Cup;Straw Medication Administration Whole meds with liquid Compensations Slow rate;Small sips/bites;Minimize environmental distractions Postural Changes Seated upright at 90 degrees   Other Recommendations 09/20/2021 Recommended Consults -- Oral Care Recommendations Oral care BID Other Recommendations -- Follow Up Recommendations No SLP follow up Assistance recommended at discharge Set up Supervision/Assistance Functional Status Assessment Patient has had a recent decline in their functional status and demonstrates the ability to make significant improvements in function in a reasonable and predictable amount of time. Frequency and Duration  09/20/2021 Speech Therapy Frequency (ACUTE ONLY) min 1 x/week Treatment Duration 1 week   Oral Phase 09/20/2021 Oral Phase Impaired Oral - Pudding Teaspoon -- Oral - Pudding Cup -- Oral - Honey Teaspoon -- Oral - Honey Cup -- Oral - Nectar Teaspoon -- Oral - Nectar Cup -- Oral - Nectar Straw -- Oral - Thin Teaspoon -- Oral - Thin Cup Piecemeal swallowing Oral - Thin Straw Piecemeal swallowing Oral - Puree WFL  Oral - Mech Soft -- Oral - Regular WFL Oral - Multi-Consistency -- Oral - Pill WFL Oral Phase - Comment --  Pharyngeal Phase 09/20/2021 Pharyngeal Phase Impaired Pharyngeal- Pudding Teaspoon -- Pharyngeal -- Pharyngeal- Pudding Cup -- Pharyngeal -- Pharyngeal- Honey Teaspoon -- Pharyngeal -- Pharyngeal- Honey Cup -- Pharyngeal -- Pharyngeal- Nectar Teaspoon -- Pharyngeal -- Pharyngeal- Nectar Cup -- Pharyngeal -- Pharyngeal- Nectar Straw -- Pharyngeal -- Pharyngeal-  Thin Teaspoon -- Pharyngeal -- Pharyngeal- Thin Cup WFL Pharyngeal -- Pharyngeal- Thin Straw Penetration/Aspiration during swallow;Penetration/Aspiration before swallow;Reduced airway/laryngeal closure Pharyngeal Material enters airway, remains ABOVE vocal cords then ejected out Pharyngeal- Puree WFL Pharyngeal -- Pharyngeal- Mechanical Soft -- Pharyngeal -- Pharyngeal- Regular WFL Pharyngeal -- Pharyngeal- Multi-consistency -- Pharyngeal -- Pharyngeal- Pill WFL Pharyngeal -- Pharyngeal Comment --  Cervical Esophageal Phase  09/20/2021 Cervical Esophageal Phase WFL Pudding Teaspoon -- Pudding Cup -- Honey Teaspoon -- Honey Cup -- Nectar Teaspoon -- Nectar Cup -- Nectar Straw -- Thin Teaspoon -- Thin Cup -- Thin Straw -- Puree -- Mechanical Soft -- Regular -- Multi-consistency -- Pill -- Cervical Esophageal Comment -- Osie Bond., M.A. [facility] Pager [phone] Office [phone] 09/20/2021, 2:38 PM                     ECHOCARDIOGRAM COMPLETE  Result Date: 09/06/2021    ECHOCARDIOGRAM REPORT   Patient Name:   LEYA PAIGE [facility] Date of Exam: 09/06/2021 Medical Rec #:  [id number]      Height:       62.0 in Accession #:    [id number]     Weight:       102.1 lb Date of Birth:  1966/04/12       BSA:          1.436 m Patient Age:    10 years       BP:           160/83 mmHg Patient Gender: F              HR:           93 bpm. Exam Location:  Inpatient Procedure: 2D Echo, Cardiac Doppler and Color Doppler Indications:    Abnormal EKG   History:        Patient has no prior history of Echocardiogram examinations.                 Arrythmias:Tachycardia, Signs/Symptoms:Cocaine and ETOH abuse,                 CKD; Risk Factors:Hypertension and Current Smoker.  Sonographer:    Dustin Flock RDCS Referring Phys: [id number] Be[place] CHAND  Sonographer Comments: Echo performed with patient supine and on artificial respirator. IMPRESSIONS  1. Left ventricular ejection fraction, by estimation, is 55 to 60%. The left ventricle has normal function. The left ventricle has no regional wall motion abnormalities. Left ventricular diastolic parameters are consistent with Grade I diastolic dysfunction (impaired relaxation).  2. Right ventricular systolic function is normal. The right ventricular size is normal. Tricuspid regurgitation signal is inadequate for assessing PA pressure.  3. The mitral valve is grossly normal. No evidence of mitral valve regurgitation. No evidence of mitral stenosis.  4. The aortic valve is tricuspid. Aortic valve regurgitation is not visualized. No aortic stenosis is present. FINDINGS  Left Ventricle: Left ventricular ejection fraction, by estimation, is 55 to 60%. The left ventricle has normal function. The left ventricle has no regional wall motion abnormalities. The left ventricular internal cavity size was normal in size. There is  no left ventricular hypertrophy. Left ventricular diastolic parameters are consistent with Grade I diastolic dysfunction (impaired relaxation). Right Ventricle: The right ventricular size is normal. No increase in right ventricular wall thickness. Right ventricular systolic function is normal. Tricuspid regurgitation signal is inadequate for assessing PA pressure. Left Atrium: Left atrial size was normal in size. Right Atrium: Right atrial size was normal  in size. Pericardium: There is no evidence of pericardial effusion. Mitral Valve: The mitral valve is grossly normal. No evidence of mitral valve  regurgitation. No evidence of mitral valve stenosis. Tricuspid Valve: The tricuspid valve is grossly normal. Tricuspid valve regurgitation is not demonstrated. No evidence of tricuspid stenosis. Aortic Valve: The aortic valve is tricuspid. Aortic valve regurgitation is not visualized. No aortic stenosis is present. Pulmonic Valve: The pulmonic valve was grossly normal. Pulmonic valve regurgitation is not visualized. No evidence of pulmonic stenosis. Aorta: The aortic root and ascending aorta are structurally normal, with no evidence of dilitation. Venous: The right lower pulmonary vein is normal. IVC assessment for right atrial pressure unable to be performed due to mechanical ventilation. IAS/Shunts: The atrial septum is grossly normal. Eleonore Chiquito MD Electronically signed by Eleonore Chiquito MD Signature Date/Time: 09/06/2021/4:37:56 PM    Final    VAS [place] UPPER EXTREMITY VENOUS DUPLEX  Result Date: 09/08/2021 UPPER VENOUS STUDY  Patient Name:  LYNDIE VANDERLOOP [facility]  Date of Exam:   09/08/2021 Medical Rec #: [id number]       Accession #:    [id number] Date of Birth: [birth year]        Patient Gender: F Patient Age:   4 years Exam Location:  [facility] Procedure:      VAS [place] UPPER EXTREMITY VENOUS DUPLEX Referring Phys: Nevin Bloodgood SIMPSON --------------------------------------------------------------------------------  Indications: Swelling Comparison Study: No previous exams Performing Technologist: Jody Hill RVT, RDMS  Examination Guidelines: A complete evaluation includes B-mode imaging, spectral Doppler, color Doppler, and power Doppler as needed of all accessible portions of each vessel. Bilateral testing is considered an integral part of a complete examination. Limited examinations for reoccurring indications may be performed as noted.  Right Findings: +----------+------------+---------+-----------+----------+-------+ RIGHT     CompressiblePhasicitySpontaneousPropertiesSummary  +----------+------------+---------+-----------+----------+-------+ IJV           Full       Yes       Yes                      +----------+------------+---------+-----------+----------+-------+ Subclavian    Full       Yes       Yes                      +----------+------------+---------+-----------+----------+-------+ Axillary      Full       Yes       Yes                      +----------+------------+---------+-----------+----------+-------+ Brachial      Full       Yes       Yes                      +----------+------------+---------+-----------+----------+-------+ Radial        Full                                          +----------+------------+---------+-----------+----------+-------+ Ulnar         Full                                          +----------+------------+---------+-----------+----------+-------+ Cephalic      None  No        No                       +----------+------------+---------+-----------+----------+-------+ Basilic       Full       Yes       Yes                      +----------+------------+---------+-----------+----------+-------+  Left Findings: +----+------------+---------+-----------+----------+-------+ LEFTCompressiblePhasicitySpontaneousPropertiesSummary +----+------------+---------+-----------+----------+-------+ IJV     Full       Yes       Yes                      +----+------------+---------+-----------+----------+-------+  Summary:  Right: No evidence of deep vein thrombosis in the upper extremity. Findings consistent with acute superficial vein thrombosis involving the right cephalic vein. Subcuntaneous edema seen in area of upper arm and ac fossa.  Left: No evidence of thrombosis in the subclavian.  *See table(s) above for measurements and observations.  Diagnosing physician: Monica Martinez MD Electronically signed by Monica Martinez MD on 09/08/2021 at 1:12:48 PM.    Final      Microbiology: No results found for this or any previous visit (from the past 240 hour(s)).   Labs: Basic Metabolic Panel: Recent Labs  Lab 09/25/21 1133 09/28/21 0057  NA 135  --   K 3.8  --   CL 101  --   CO2 26  --   GLUCOSE 107*  --   BUN 55*  --   CREATININE 1.51*  --   CALCIUM 9.1  --   PHOS  --  4.5   Liver Function Tests: No results for input(s): AST, ALT, ALKPHOS, BILITOT, PROT, ALBUMIN in the last 168 hours. No results for input(s): LIPASE, AMYLASE in the last 168 hours. No results for input(s): AMMONIA in the last 168 hours. CBC: No results for input(s): WBC, NEUTROABS, HGB, HCT, MCV, PLT in the last 168 hours. Cardiac Enzymes: No results for input(s): CKTOTAL, CKMB, CKMBINDEX, TROPONINI in the last 168 hours. BNP: BNP (last 3 results) Recent Labs    03/30/21 0153 06/23/21 0100  BNP 231.8* 485.8*    ProBNP (last 3 results) No results for input(s): PROBNP in the last 8760 hours.  CBG: Recent Labs  Lab 09/23/21 1230 09/23/21 1539 09/23/21 2354 09/24/21 0335 09/24/21 0817  GLUCAP 96 89 88 92 91       Signed:  Erin Hearing ANP [facility] 09/30/2021, 10:37 AM

## 2021-09-30 NOTE — Progress Notes (Signed)
Mobility Specialist Progress Note:   09/30/21 1000  Mobility  Activity Ambulated in hall  Level of Assistance Modified independent, requires aide device or extra time  Assistive Device Front wheel walker  Distance Ambulated (ft) 300 ft  Mobility Ambulated with assistance in hallway  Mobility Response Tolerated well  Mobility performed by Mobility specialist  $Mobility charge 1 Mobility   Pt c/o back pain during ambulation, which is baseline. Pt sitting EOB, eager for d/c.  Nelta Numbers Mobility Specialist  Phone [phone]

## 2021-09-30 NOTE — Progress Notes (Signed)
Nutrition Follow-up  DOCUMENTATION CODES:   Severe malnutrition in context of chronic illness  INTERVENTION:   Continue Nepro Shake po BID, each supplement provides 425 kcal and 19 grams protein Continue 30 ml ProSource Plus BID, each supplement provides 100 kcals and 15 grams protein.  Continue Multivitamin w/ minerals daily Encourage good PO intake   NUTRITION DIAGNOSIS:   Severe Malnutrition related to chronic illness (cirrhosis, polysubstance abuse) as evidenced by severe muscle depletion, severe fat depletion. - Ongoing  GOAL:   Patient will meet greater than or equal to 90% of their needs - Progressing  MONITOR:   PO intake, Supplement acceptance, TF tolerance, Skin, Labs  REASON FOR ASSESSMENT:   Consult Calorie Count  ASSESSMENT:   55 year old female who presented to the ED on 11/03 with AMS. PMH of EtOH abuse, cirrhosis, complicated EtOH withdrawals requiring prior intubation, cocaine abuse, HTN, CKD stage III, GERD, rectal cancer s/p resection with colostomy, bipolar 1 disorder, depression, tobacco abuse. Pt admitted with cocaine intoxication and EtOH withdrawal and required intubation for airway protection.  11/09: TF d/c  11/10: extubated; OG removed 11/11: transferred to the floor; Cortrak placed (gastric/D1); TF started  11/18: diet advanced to regular, thin liquids 11/22: Cortrak removed  Pt with mobility at time of visit; unable to speak with pt.  Per Epic, pt intake includes: 11/25: Breakfast 60% 11/26: Breakfast 10%, Lunch 20%, Dinner 20% 11/27: Breakfast 50%, Lunch 100%, Dinner 100%  Per CM note, pt is discharging home today.   Medications reviewed and include: Vitamin D3, Colace, Folic Acid, MVI, Protonix, Miralax, Renvela, Thiamine Labs reviewed: BUN 55, Creatinine 1.51  Diet Order:   Diet Order             Diet regular Room service appropriate? Yes; Fluid consistency: Thin  Diet effective now                   EDUCATION NEEDS:    No education needs have been identified at this time  Skin:  Skin Assessment: Skin Integrity Issues: Skin Integrity Issues:: Stage III, Other (Comment) Stage III: coccyx Other: skin tear L arm  Last BM:  09/27/2021 - 100 mL via colostomy  Height:   Ht Readings from Last 1 Encounters:  09/05/21 5\' 2"  (1.575 m)    Weight:   Wt Readings from Last 1 Encounters:  09/27/21 45 kg    Ideal Body Weight:  50 kg  BMI:  Body mass index is 18.15 kg/m.  Estimated Nutritional Needs:   Kcal:  1600-1800  Protein:  80-95g  Fluid:  >1.6L    Hermina Barters BS, PLDN Clinical Dietitian See AMiON for contact information.

## 2021-09-30 NOTE — Progress Notes (Signed)
Occupational Therapy Treatment Patient Details Name: MADISSEN WYSE MRN: [id number] DOB: 11-24-1965 Today's Date: 09/30/2021   History of present illness Pt is a 55 y/o female brought to ED after found to be minimally responsive behind a dumpster surrounded by beer cans.  She was hypotensive with tachycardia, agitation and intermittent somnolence.  She was intubated for airway protection and admitted to ICU for complicated EtOH withdrawal.  Initially treated with Precedex which was discontinued due to bradycardia.  She was a started on fentanyl drip, Librium and Ativan as needed.  She also received empiric antibiotics from 11/4-11/8 with Vanco and Unasyn then on Vanco and CTX presumably for respiratory infection. Eventually extubated on 11/10, and transferred to [facility] service on 11/12.  She is on TF via cortrak for dysphagia. PMH including but not limited to EtOH abuse, complicated EtOH withdrawal requiring intubation, cocaine use, tobacco use disorder, CKD-3, HTN, depression, bipolar disorder, vitamin D deficiency, vitamin B12 deficiency, rectal cancer s/p resection, chemo and ostomy.   OT comments  Pt is progressing towards OT goals. Pt completing toilet transfers and functional mobility with supervision and dressing independently. Pt plans to return home today and has no concerns regarding safety or occupational performance. Pt states that her roommates can assist her as needed with IADLs. Pt occupational performance has improved since admission and now do not anticipate pt will need any further skilled OT services after d/c. Will continue to follow acutely.   Recommendations for follow up therapy are one component of a multi-disciplinary discharge planning process, led by the attending physician.  Recommendations may be updated based on patient status, additional functional criteria and insurance authorization.    Follow Up Recommendations  No OT follow up    Assistance Recommended at Discharge  Intermittent Supervision/Assistance  Equipment Recommendations  None recommended by OT    Recommendations for Other Services      Precautions / Restrictions Precautions Precautions: Fall Precaution Comments: colostomy Restrictions Weight Bearing Restrictions: No       Mobility Bed Mobility Overal bed mobility: Modified Independent                  Transfers Overall transfer level: Needs assistance Equipment used: Rolling walker (2 wheels) Transfers: Sit to/from Stand Sit to Stand: Supervision                 Balance Overall balance assessment: Mild deficits observed, not formally tested                                         ADL either performed or assessed with clinical judgement   ADL Overall ADL's : Needs assistance/impaired                 Upper Body Dressing : Independent   Lower Body Dressing: Independent   Toilet Transfer: Supervision/safety           Functional mobility during ADLs: Supervision/safety;Rolling walker (2 wheels)      Extremity/Trunk Assessment              Vision       Perception     Praxis      Cognition Arousal/Alertness: Awake/alert Behavior During Therapy: WFL for tasks assessed/performed Overall Cognitive Status: No family/caregiver present to determine baseline cognitive functioning  General Comments: A&Ox4. Following commands and communicating well          Exercises     Shoulder Instructions       General Comments      Pertinent Vitals/ Pain       Pain Assessment: No/denies pain  Home Living                                          Prior Functioning/Environment              Frequency  Min 2X/week        Progress Toward Goals  OT Goals(current goals can now be found in the care plan section)  Progress towards OT goals: Progressing toward goals  Acute Rehab OT Goals Patient Stated  Goal: return home OT Goal Formulation: With patient Time For Goal Achievement: 09/30/21 Potential to Achieve Goals: Good ADL Goals Pt Will Perform Grooming: with mod assist;sitting Pt Will Perform Upper Body Bathing: with mod assist;sitting Pt Will Perform Lower Body Bathing: with mod assist;sitting/lateral leans Pt Will Perform Upper Body Dressing: with mod assist;sitting Pt Will Perform Lower Body Dressing: with mod assist;sitting/lateral leans Pt Will Transfer to Toilet: with mod assist;stand pivot transfer;bedside commode  Plan Discharge plan needs to be updated    Co-evaluation                 AM-PAC OT "6 Clicks" Daily Activity     Outcome Measure   Help from another person eating meals?: None Help from another person taking care of personal grooming?: None Help from another person toileting, which includes using toliet, bedpan, or urinal?: A Little Help from another person bathing (including washing, rinsing, drying)?: A Little Help from another person to put on and taking off regular upper body clothing?: None Help from another person to put on and taking off regular lower body clothing?: None 6 Click Score: 22    End of Session Equipment Utilized During Treatment: Rolling walker (2 wheels)  OT Visit Diagnosis: Muscle weakness (generalized) (M62.81);Other symptoms and signs involving cognitive function;Pain   Activity Tolerance Patient tolerated treatment well   Patient Left in bed;with call bell/phone within reach   Nurse Communication Mobility status        Time: [id number] OT Time Calculation (min): 16 min  Charges: OT General Charges $OT Visit: 1 Visit OT Treatments $Self Care/Home Management : 8-22 mins  Sussan Meter C, OT/L  Acute Rehab [facility] 09/30/2021, 11:12 AM

## 2021-09-30 NOTE — Progress Notes (Signed)
Mamie Nick to be D/C'd  per MD order.  Discussed with the patient and all questions fully answered.  VSS, Skin clean, dry and intact without evidence of skin break down, no evidence of skin tears noted.  IV catheter discontinued intact. Site without signs and symptoms of complications. Dressing and pressure applied.  An After Visit Summary was printed and given to the patient. Patient received prescription from [facility]  D/c re-education completed with patient/family including follow up instructions, medication list, d/c activities limitations if indicated, with other d/c instructions as indicated by MD - patient able to verbalize understanding, all questions fully answered.   Patient instructed to return to ED, call 911, or call MD for any changes in condition.   Patient to be escorted via [place], and D/C home via private auto with her brother.

## 2021-10-07 LAB — GLUCOSE, CAPILLARY
Glucose-Capillary: 119 mg/dL — ABNORMAL HIGH (ref 70–99)
Glucose-Capillary: 90 mg/dL (ref 70–99)
Glucose-Capillary: 99 mg/dL (ref 70–99)

## 2021-10-24 ENCOUNTER — Emergency Department (HOSPITAL_COMMUNITY): Payer: Medicaid Other

## 2021-10-24 ENCOUNTER — Inpatient Hospital Stay (HOSPITAL_COMMUNITY)
Admission: EM | Admit: 2021-10-24 | Discharge: 2021-11-16 | DRG: 896 | Disposition: A | Discharge status: Home / Self Care | Payer: Medicaid Other | Attending: Internal Medicine | Admitting: Internal Medicine

## 2021-10-24 DIAGNOSIS — F1210 Cannabis abuse, uncomplicated: Secondary | ICD-10-CM | POA: Diagnosis present

## 2021-10-24 DIAGNOSIS — Z808 Family history of malignant neoplasm of other organs or systems: Secondary | ICD-10-CM

## 2021-10-24 DIAGNOSIS — N289 Disorder of kidney and ureter, unspecified: Secondary | ICD-10-CM

## 2021-10-24 DIAGNOSIS — Z923 Personal history of irradiation: Secondary | ICD-10-CM

## 2021-10-24 DIAGNOSIS — Z20822 Contact with and (suspected) exposure to covid-19: Secondary | ICD-10-CM | POA: Diagnosis present

## 2021-10-24 DIAGNOSIS — B964 Proteus (mirabilis) (morganii) as the cause of diseases classified elsewhere: Secondary | ICD-10-CM | POA: Diagnosis present

## 2021-10-24 DIAGNOSIS — F32A Depression, unspecified: Secondary | ICD-10-CM | POA: Diagnosis present

## 2021-10-24 DIAGNOSIS — R451 Restlessness and agitation: Secondary | ICD-10-CM

## 2021-10-24 DIAGNOSIS — Z681 Body mass index (BMI) 19 or less, adult: Secondary | ICD-10-CM

## 2021-10-24 DIAGNOSIS — I130 Hypertensive heart and chronic kidney disease with heart failure and stage 1 through stage 4 chronic kidney disease, or unspecified chronic kidney disease: Secondary | ICD-10-CM | POA: Diagnosis present

## 2021-10-24 DIAGNOSIS — N179 Acute kidney failure, unspecified: Secondary | ICD-10-CM | POA: Diagnosis not present

## 2021-10-24 DIAGNOSIS — F10231 Alcohol dependence with withdrawal delirium: Principal | ICD-10-CM | POA: Diagnosis present

## 2021-10-24 DIAGNOSIS — F05 Delirium due to known physiological condition: Secondary | ICD-10-CM | POA: Diagnosis present

## 2021-10-24 DIAGNOSIS — I10 Essential (primary) hypertension: Secondary | ICD-10-CM

## 2021-10-24 DIAGNOSIS — F17210 Nicotine dependence, cigarettes, uncomplicated: Secondary | ICD-10-CM | POA: Diagnosis present

## 2021-10-24 DIAGNOSIS — R4182 Altered mental status, unspecified: Secondary | ICD-10-CM

## 2021-10-24 DIAGNOSIS — Z4659 Encounter for fitting and adjustment of other gastrointestinal appliance and device: Secondary | ICD-10-CM

## 2021-10-24 DIAGNOSIS — F4323 Adjustment disorder with mixed anxiety and depressed mood: Secondary | ICD-10-CM

## 2021-10-24 DIAGNOSIS — G928 Other toxic encephalopathy: Secondary | ICD-10-CM | POA: Diagnosis present

## 2021-10-24 DIAGNOSIS — L8990 Pressure ulcer of unspecified site, unspecified stage: Secondary | ICD-10-CM | POA: Diagnosis present

## 2021-10-24 DIAGNOSIS — D696 Thrombocytopenia, unspecified: Secondary | ICD-10-CM

## 2021-10-24 DIAGNOSIS — F10931 Alcohol use, unspecified with withdrawal delirium: Secondary | ICD-10-CM | POA: Diagnosis present

## 2021-10-24 DIAGNOSIS — N390 Urinary tract infection, site not specified: Secondary | ICD-10-CM

## 2021-10-24 DIAGNOSIS — J384 Edema of larynx: Secondary | ICD-10-CM | POA: Diagnosis not present

## 2021-10-24 DIAGNOSIS — Z9221 Personal history of antineoplastic chemotherapy: Secondary | ICD-10-CM

## 2021-10-24 DIAGNOSIS — F1410 Cocaine abuse, uncomplicated: Secondary | ICD-10-CM

## 2021-10-24 DIAGNOSIS — R1013 Epigastric pain: Secondary | ICD-10-CM

## 2021-10-24 DIAGNOSIS — T380X5A Adverse effect of glucocorticoids and synthetic analogues, initial encounter: Secondary | ICD-10-CM | POA: Diagnosis not present

## 2021-10-24 DIAGNOSIS — Z781 Physical restraint status: Secondary | ICD-10-CM

## 2021-10-24 DIAGNOSIS — J189 Pneumonia, unspecified organism: Secondary | ICD-10-CM

## 2021-10-24 DIAGNOSIS — R0902 Hypoxemia: Secondary | ICD-10-CM

## 2021-10-24 DIAGNOSIS — I5032 Chronic diastolic (congestive) heart failure: Secondary | ICD-10-CM | POA: Diagnosis present

## 2021-10-24 DIAGNOSIS — E860 Dehydration: Secondary | ICD-10-CM | POA: Diagnosis present

## 2021-10-24 DIAGNOSIS — E876 Hypokalemia: Secondary | ICD-10-CM | POA: Diagnosis not present

## 2021-10-24 DIAGNOSIS — E43 Unspecified severe protein-calorie malnutrition: Secondary | ICD-10-CM | POA: Diagnosis present

## 2021-10-24 DIAGNOSIS — Z933 Colostomy status: Secondary | ICD-10-CM

## 2021-10-24 DIAGNOSIS — F10939 Alcohol use, unspecified with withdrawal, unspecified: Secondary | ICD-10-CM | POA: Diagnosis present

## 2021-10-24 DIAGNOSIS — D509 Iron deficiency anemia, unspecified: Secondary | ICD-10-CM | POA: Diagnosis present

## 2021-10-24 DIAGNOSIS — K7030 Alcoholic cirrhosis of liver without ascites: Secondary | ICD-10-CM | POA: Diagnosis present

## 2021-10-24 DIAGNOSIS — J690 Pneumonitis due to inhalation of food and vomit: Secondary | ICD-10-CM

## 2021-10-24 DIAGNOSIS — R1310 Dysphagia, unspecified: Secondary | ICD-10-CM | POA: Diagnosis present

## 2021-10-24 DIAGNOSIS — Z85048 Personal history of other malignant neoplasm of rectum, rectosigmoid junction, and anus: Secondary | ICD-10-CM

## 2021-10-24 DIAGNOSIS — G9340 Encephalopathy, unspecified: Secondary | ICD-10-CM | POA: Diagnosis present

## 2021-10-24 DIAGNOSIS — F319 Bipolar disorder, unspecified: Secondary | ICD-10-CM | POA: Diagnosis present

## 2021-10-24 DIAGNOSIS — F1020 Alcohol dependence, uncomplicated: Secondary | ICD-10-CM

## 2021-10-24 DIAGNOSIS — L299 Pruritus, unspecified: Secondary | ICD-10-CM | POA: Diagnosis present

## 2021-10-24 DIAGNOSIS — R5381 Other malaise: Secondary | ICD-10-CM | POA: Diagnosis present

## 2021-10-24 DIAGNOSIS — N1831 Chronic kidney disease, stage 3a: Secondary | ICD-10-CM | POA: Diagnosis present

## 2021-10-24 DIAGNOSIS — K760 Fatty (change of) liver, not elsewhere classified: Secondary | ICD-10-CM

## 2021-10-24 DIAGNOSIS — Z880 Allergy status to penicillin: Secondary | ICD-10-CM

## 2021-10-24 DIAGNOSIS — D6959 Other secondary thrombocytopenia: Secondary | ICD-10-CM | POA: Diagnosis present

## 2021-10-24 DIAGNOSIS — F1910 Other psychoactive substance abuse, uncomplicated: Secondary | ICD-10-CM | POA: Diagnosis present

## 2021-10-24 DIAGNOSIS — Z79899 Other long term (current) drug therapy: Secondary | ICD-10-CM

## 2021-10-24 DIAGNOSIS — R739 Hyperglycemia, unspecified: Secondary | ICD-10-CM | POA: Diagnosis not present

## 2021-10-24 DIAGNOSIS — J9601 Acute respiratory failure with hypoxia: Secondary | ICD-10-CM | POA: Diagnosis not present

## 2021-10-24 DIAGNOSIS — L89153 Pressure ulcer of sacral region, stage 3: Secondary | ICD-10-CM | POA: Diagnosis present

## 2021-10-24 DIAGNOSIS — M1990 Unspecified osteoarthritis, unspecified site: Secondary | ICD-10-CM | POA: Diagnosis present

## 2021-10-24 DIAGNOSIS — F419 Anxiety disorder, unspecified: Secondary | ICD-10-CM | POA: Diagnosis present

## 2021-10-24 MED ORDER — LORAZEPAM 2 MG/ML IJ SOLN
1.0000 mg | Freq: Once | INTRAMUSCULAR | Status: AC
  Administered 2021-10-24: 23:00:00 1 mg via INTRAVENOUS
  Filled 2021-10-24: qty 1

## 2021-10-24 NOTE — ED Triage Notes (Signed)
From home with AMS. Last unkown well time. Not coherent. Moaning, yelling, flailing arms and not following commands

## 2021-10-24 NOTE — ED Provider Notes (Signed)
[facility] Provider Note   CSN: [id number] Arrival date & time: 10/24/21  2256     History Chief Complaint  Patient presents with   Altered Mental Status    From home with AMS. Last unkown well time. Not coherent. Moaning, yelling, flailing arms and not following commands    Sherry Baker is a 55 y.o. female.  The history is provided by the EMS personnel. The history is limited by the condition of the patient (Altered mental status).  Altered Mental Status     Past Medical History:  Diagnosis Date   Alcohol abuse    Allergy    PCNS swelling   Arthritis    Bipolar 1 disorder ([place])    Cancer ([place]) 01/21/2017   rectal cancer   Cancer ([place])    Cirrhosis of liver ([place])    Depression    Genetic testing 03/24/2017   Ms. Jimerson underwent genetic counseling and testing for hereditary cancer syndromes on 02/17/2017. Her results were negative for mutations in all 46 genes analyzed by [facility]'s 46-gene Common Hereditary Cancers Panel. Genes analyzed include: APC, ATM, AXIN2, BARD1, BMPR1A, BRCA1, BRCA2, BRIP1, CDH1, CDKN2A, CHEK2, CTNNA1, DICER1, EPCAM, GREM1, HOXB13, KIT, MEN1, MLH1, MSH2, MSH3, MSH6, MUTYH, NBN,   Hypertension     Patient Active Problem List   Diagnosis Date Noted   Physical deconditioning    Ambulatory dysfunction    Underweight 06/25/2021   Protein-calorie malnutrition, severe 06/25/2021   Homelessness 06/23/2021   Toxic encephalopathy 87/56/4332   Alcoholic cirrhosis of liver without ascites ([place]) 06/23/2021   COVID-19 virus infection 06/23/2021   Delirium tremens ([place]) 06/22/2021   Altered mental status 03/29/2021   Alcohol withdrawal ([place]) 03/28/2021   Alcohol abuse 06/14/2019   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   Normochromic normocytic anemia 06/06/2019   Pressure injury of skin 06/06/2019   Substance abuse ([place])    Seizure ([place]) 05/21/2019   Acute encephalopathy 05/18/2019   Acute lower UTI  05/18/2019   Polysubstance abuse ([place]) 05/18/2019   Wound dehiscence 07/10/2017   Genetic testing 03/24/2017   Hypertension 02/18/2017   GERD (gastroesophageal reflux disease) 02/18/2017   Rectal cancer ([place]) 01/27/2017   Abdominal pain, epigastric    Rectal mass    Fever    Anemia 01/16/2017   Abdominal pain    Constipation    Alcohol abuse    Tobacco abuse    Cocaine abuse ([place])    Bipolar affective disorder ([place])    Severe malnutrition ([place]) 02/13/2014   Suicide ideation 02/12/2014   Suicidal ideation 02/11/2014   Acute alcohol intoxication, with delirium ([place]) 02/11/2014   Transaminitis 02/11/2014   Substance abuse ([place]) 02/11/2014   Depression 02/11/2014   Thrombocytopenia ([place]) 02/11/2014    Past Surgical History:  Procedure Laterality Date   ABDOMINAL PERINEAL BOWEL RESECTION N/A 06/18/2017   Procedure: ABDOMINAL PERINEAL RESECTION ERAS PATHWAY;  Surgeon: Leighton Ruff, MD;  Location: [facility];  Service: General;  Laterality: N/A;   COLON SURGERY     COLONOSCOPY WITH PROPOFOL Left 01/21/2017   Procedure: COLONOSCOPY WITH PROPOFOL;  Surgeon: Otis Brace, MD;  Location: [facility];  Service: Gastroenterology;  Laterality: Left;   FRACTURE SURGERY     MANDIBLE FRACTURE SURGERY       OB History   No obstetric history on file.     Family History  Problem Relation Age of Onset   Cancer Mother 80       Originating in the  abdomen, otherwise unknwon   Cancer - Other Maternal Aunt        Throat    Social History   Tobacco Use   Smoking status: Every Day    Packs/day: 0.50    Years: 39.00    Pack years: 19.50    Types: Cigarettes   Smokeless tobacco: Never  Vaping Use   Vaping Use: Never used  Substance Use Topics   Alcohol use: Yes    Comment: 07/28/2017 "40oz beer/day; if I have it"   Drug use: Yes    Types: "Crack" cocaine, Cocaine    Comment: 07/28/2017 "nothing in 3 wks"    Home Medications Prior to Admission medications   Medication Sig  Start Date End Date Taking? Authorizing Provider  acetaminophen (TYLENOL) 325 MG tablet Take 2 tablets (650 mg total) by mouth every 6 (six) hours as needed for mild pain, headache or fever. 09/30/21   Samella Parr, NP  amLODipine (NORVASC) 10 MG tablet Take 1 tablet (10 mg total) by mouth daily. 06/30/21   Oswald Hillock, MD  busPIRone (BUSPAR) 15 MG tablet Take 1 tablet (15 mg total) by mouth 2 (two) times daily. 11/15/20   Argentina Donovan, PA-C  carvedilol (COREG) 3.125 MG tablet Take 3 tablets (9.375 mg total) by mouth 2 (two) times daily with a meal. 09/30/21   Samella Parr, NP  citalopram (CELEXA) 10 MG tablet Take 1 tablet (10 mg total) by mouth daily. 04/09/21   Pokhrel, Corrie Mckusick, MD  diclofenac Sodium (VOLTAREN) 1 % GEL Apply 4 g topically 4 (four) times daily. 09/30/21   Samella Parr, NP  gabapentin (NEURONTIN) 100 MG capsule Take 1 capsule (100 mg total) by mouth 3 (three) times daily. 04/08/21   Pokhrel, Corrie Mckusick, MD  hydrOXYzine (ATARAX/VISTARIL) 10 MG tablet Take 1 tablet (10 mg total) by mouth 3 (three) times daily as needed. As needed for acute anxiety 11/15/20   Argentina Donovan, PA-C  Multiple Vitamin (MULTIVITAMIN WITH MINERALS) TABS tablet Place 1 tablet into feeding tube daily. Patient taking differently: Take 1 tablet by mouth daily. 04/09/21   Pokhrel, Corrie Mckusick, MD  omeprazole (PRILOSEC) 40 MG capsule Take 1 capsule (40 mg total) by mouth daily. 11/15/20   Argentina Donovan, PA-C  thiamine 100 MG tablet Take 1 tablet (100 mg total) by mouth daily. 07/01/21   Oswald Hillock, MD  traMADol (ULTRAM) 50 MG tablet Take 1 tablet (50 mg total) by mouth every 6 (six) hours as needed for moderate pain. 09/30/21   Samella Parr, NP  Vitamin D3 (VITAMIN D) 25 MCG tablet Take 1 tablet (1,000 Units total) by mouth daily. 09/30/21   Samella Parr, NP  ziprasidone (GEODON) 40 MG capsule Take 1 capsule (40 mg total) by mouth 2 (two) times daily with a meal. 11/06/11 01/09/12  Daleen Bo, MD     Allergies    Penicillins  Review of Systems   Review of Systems  All other systems reviewed and are negative.  Physical Exam Updated Vital Signs There were no vitals taken for this visit.  Physical Exam Vitals and nursing note reviewed.  55 year old female, agitated and randomly flailing both arms and both legs. Vital signs are significant for elevated blood pressure. Oxygen saturation is 98%, which is normal. Head is normocephalic and atraumatic. PERRLA. Oropharynx is clear. Neck is nontender and supple without adenopathy or JVD. Back is nontender and there is no CVA tenderness. Lungs are clear without rales,  wheezes, or rhonchi. Chest is nontender. Heart has regular rate and rhythm without murmur. Abdomen is soft, flat, nontender.  Colostomy present left lower quadrant. Extremities have no cyanosis or edema, full range of motion is present. Skin is warm and dry.  Stage III sacral decubitus present. Neurologic: Awake but nonverbal and will not follow commands, cranial nerves are intact, moves all extremities equally.  ED Results / Procedures / Treatments   Labs (all labs ordered are listed, but only abnormal results are displayed) Labs Reviewed  COMPREHENSIVE METABOLIC PANEL - Abnormal; Notable for the following components:      Result Value   Creatinine, Ser 1.36 (*)    Albumin 2.8 (*)    GFR, Estimated 47 (*)    All other components within normal limits  RAPID URINE DRUG SCREEN, HOSP PERFORMED - Abnormal; Notable for the following components:   Cocaine POSITIVE (*)    Tetrahydrocannabinol POSITIVE (*)    All other components within normal limits  URINALYSIS, ROUTINE W REFLEX MICROSCOPIC - Abnormal; Notable for the following components:   APPearance CLOUDY (*)    Hgb urine dipstick SMALL (*)    Ketones, ur 5 (*)    Protein, ur 100 (*)    Leukocytes,Ua LARGE (*)    WBC, UA >50 (*)    Bacteria, UA MANY (*)    All other components within normal limits  CBC WITH  DIFFERENTIAL/PLATELET - Abnormal; Notable for the following components:   RDW 16.8 (*)    Platelets 144 (*)    All other components within normal limits  RESP PANEL BY RT-PCR (FLU A&B, COVID) ARPGX2  URINE CULTURE  ETHANOL  LACTIC ACID, PLASMA  CBC WITH DIFFERENTIAL/PLATELET  TROPONIN I (HIGH SENSITIVITY)  TROPONIN I (HIGH SENSITIVITY)    EKG EKG Interpretation  Date/Time:  Thursday October 24 2021 23:08:51 EST Ventricular Rate:  77 PR Interval:  128 QRS Duration: 98 QT Interval:  391 QTC Calculation: 443 R Axis:   80 Text Interpretation: Sinus rhythm Probable left atrial enlargement Probable anterior infarct, old Minimal ST depression, lateral leads When compared with ECG of 09/18/2021, T wave inversion is no longer present Confirmed by Delora Fuel ([id number]) on 10/24/2021 11:11:40 PM  Radiology CT Head Wo Contrast  Result Date: 10/25/2021 CLINICAL DATA:  Mental status change, unknown cause. EXAM: CT HEAD WITHOUT CONTRAST TECHNIQUE: Contiguous axial images were obtained from the base of the skull through the vertex without intravenous contrast. COMPARISON:  09/05/2021. FINDINGS: Brain: No acute intracranial hemorrhage, midline shift or mass effect. No extra-axial fluid collection. Periventricular and subcortical white matter hypodensities are noted bilaterally. There is no hydrocephalus. There is a hypodensity in the basal ganglia on the right, suggesting old lacunar infarct. Mild atrophy is noted. Vascular: Atherosclerotic calcification of the carotid siphons. No hyperdense vessel. Skull: Normal. Negative for fracture or focal lesion. Sinuses/Orbits: There is partial opacification of the mastoid air cells on the left. Mucosal thickening is noted in the maxillary sinuses bilaterally and ethmoid air cells on the right. No acute orbital abnormality. Other: None. IMPRESSION: 1. No acute intracranial process. 2. 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 3. Bilateral maxillary and right ethmoid sinus disease. 4. Partial opacification of the mastoid air cells on the left, unchanged from the prior exam. Electronically Signed   By: Brett Fairy M.D.   On: 10/25/2021 01:29   DG Chest Port 1 View  Result Date: 10/24/2021 CLINICAL DATA:  Altered  mental status. EXAM: PORTABLE CHEST 1 VIEW COMPARISON:  09/09/2021. FINDINGS: The heart size and mediastinal contours are within normal limits. No consolidation, effusion, or pneumothorax. No acute osseous abnormality. IMPRESSION: No acute cardiopulmonary process. Electronically Signed   By: Brett Fairy M.D.   On: 10/24/2021 23:30    Procedures Procedures  CRITICAL CARE Performed by: Delora Fuel Total critical care time: 40 minutes Critical care time was exclusive of separately billable procedures and treating other patients. Critical care was necessary to treat or prevent imminent or life-threatening deterioration. Critical care was time spent personally by me on the following activities: development of treatment plan with patient and/or surrogate as well as nursing, discussions with consultants, evaluation of patient's response to treatment, examination of patient, obtaining history from patient or surrogate, ordering and performing treatments and interventions, ordering and review of laboratory studies, ordering and review of radiographic studies, pulse oximetry and re-evaluation of patient's condition.  Medications Ordered in ED Medications  LORazepam (ATIVAN) injection 1 mg (has no administration in time range)    ED Course  I have reviewed the triage vital signs and the nursing notes.  Pertinent labs & imaging results that were available during my care of the patient were reviewed by me and considered in my medical decision making (see chart for details).   MDM Rules/Calculators/A&P                         Altered mental status with agitation.  Differential is  broad and includes metabolic encephalopathy, occult closed head injury, intoxication.  Old records are reviewed, and she has several recent hospitalizations with similar presentations secondary to alcohol abuse and alcohol withdrawal.  She will need some sedation in order to proceed with work-up, so is given intravenous lorazepam.  ECG shows slight ST depression in inferior and anterolateral leads similar to prior ECG, but deep T wave inversions present previously are no longer present.  Following lorazepam, patient is resting quietly.  CT of head shows no acute process.  Chest x-ray shows no evidence of pneumonia.  Labs show mild renal insufficiency which is actually improved compared with baseline.  Urinalysis is significant for greater than 50 WBCs, presence of WBC clumps, many bacteria consistent with UTI.  Specimen is sent for culture and she started on ceftriaxone.  Drug screen is positive for cocaine and tetrahydrocannabinol.  Case is discussed with Dr. Marlowe Sax of [facility], who agrees to admit the patient.  Final Clinical Impression(s) / ED Diagnoses Final diagnoses:  Agitation  Urinary tract infection without hematuria, site unspecified  Renal insufficiency  Cocaine abuse [facility])    Rx / DC Orders ED Discharge Orders     None        Delora Fuel, MD 76/14/70 [phone]

## 2021-10-25 ENCOUNTER — Encounter (HOSPITAL_COMMUNITY): Payer: Self-pay | Admitting: Internal Medicine

## 2021-10-25 ENCOUNTER — Emergency Department (HOSPITAL_COMMUNITY): Payer: Medicaid Other

## 2021-10-25 ENCOUNTER — Inpatient Hospital Stay (HOSPITAL_COMMUNITY): Payer: Medicaid Other

## 2021-10-25 DIAGNOSIS — F1410 Cocaine abuse, uncomplicated: Secondary | ICD-10-CM | POA: Diagnosis present

## 2021-10-25 DIAGNOSIS — L89153 Pressure ulcer of sacral region, stage 3: Secondary | ICD-10-CM | POA: Diagnosis present

## 2021-10-25 DIAGNOSIS — F1020 Alcohol dependence, uncomplicated: Secondary | ICD-10-CM | POA: Diagnosis not present

## 2021-10-25 DIAGNOSIS — F1910 Other psychoactive substance abuse, uncomplicated: Secondary | ICD-10-CM | POA: Diagnosis not present

## 2021-10-25 DIAGNOSIS — N1831 Chronic kidney disease, stage 3a: Secondary | ICD-10-CM | POA: Diagnosis present

## 2021-10-25 DIAGNOSIS — D649 Anemia, unspecified: Secondary | ICD-10-CM | POA: Diagnosis not present

## 2021-10-25 DIAGNOSIS — L299 Pruritus, unspecified: Secondary | ICD-10-CM | POA: Diagnosis present

## 2021-10-25 DIAGNOSIS — J9601 Acute respiratory failure with hypoxia: Secondary | ICD-10-CM | POA: Diagnosis not present

## 2021-10-25 DIAGNOSIS — J690 Pneumonitis due to inhalation of food and vomit: Secondary | ICD-10-CM | POA: Diagnosis not present

## 2021-10-25 DIAGNOSIS — F10939 Alcohol use, unspecified with withdrawal, unspecified: Secondary | ICD-10-CM

## 2021-10-25 DIAGNOSIS — E876 Hypokalemia: Secondary | ICD-10-CM | POA: Diagnosis not present

## 2021-10-25 DIAGNOSIS — G928 Other toxic encephalopathy: Secondary | ICD-10-CM | POA: Diagnosis present

## 2021-10-25 DIAGNOSIS — E860 Dehydration: Secondary | ICD-10-CM | POA: Diagnosis present

## 2021-10-25 DIAGNOSIS — F10931 Alcohol use, unspecified with withdrawal delirium: Secondary | ICD-10-CM | POA: Diagnosis present

## 2021-10-25 DIAGNOSIS — I10 Essential (primary) hypertension: Secondary | ICD-10-CM | POA: Diagnosis not present

## 2021-10-25 DIAGNOSIS — F319 Bipolar disorder, unspecified: Secondary | ICD-10-CM | POA: Diagnosis present

## 2021-10-25 DIAGNOSIS — Z85048 Personal history of other malignant neoplasm of rectum, rectosigmoid junction, and anus: Secondary | ICD-10-CM

## 2021-10-25 DIAGNOSIS — I130 Hypertensive heart and chronic kidney disease with heart failure and stage 1 through stage 4 chronic kidney disease, or unspecified chronic kidney disease: Secondary | ICD-10-CM | POA: Diagnosis present

## 2021-10-25 DIAGNOSIS — G9340 Encephalopathy, unspecified: Secondary | ICD-10-CM | POA: Diagnosis not present

## 2021-10-25 DIAGNOSIS — I5032 Chronic diastolic (congestive) heart failure: Secondary | ICD-10-CM | POA: Diagnosis present

## 2021-10-25 DIAGNOSIS — N390 Urinary tract infection, site not specified: Secondary | ICD-10-CM | POA: Diagnosis present

## 2021-10-25 DIAGNOSIS — R1310 Dysphagia, unspecified: Secondary | ICD-10-CM | POA: Diagnosis not present

## 2021-10-25 DIAGNOSIS — Z9911 Dependence on respirator [ventilator] status: Secondary | ICD-10-CM | POA: Diagnosis not present

## 2021-10-25 DIAGNOSIS — F1210 Cannabis abuse, uncomplicated: Secondary | ICD-10-CM | POA: Diagnosis present

## 2021-10-25 DIAGNOSIS — D6959 Other secondary thrombocytopenia: Secondary | ICD-10-CM | POA: Diagnosis present

## 2021-10-25 DIAGNOSIS — Z681 Body mass index (BMI) 19 or less, adult: Secondary | ICD-10-CM | POA: Diagnosis not present

## 2021-10-25 DIAGNOSIS — Z20822 Contact with and (suspected) exposure to covid-19: Secondary | ICD-10-CM | POA: Diagnosis present

## 2021-10-25 DIAGNOSIS — K7030 Alcoholic cirrhosis of liver without ascites: Secondary | ICD-10-CM | POA: Diagnosis present

## 2021-10-25 DIAGNOSIS — D696 Thrombocytopenia, unspecified: Secondary | ICD-10-CM | POA: Diagnosis not present

## 2021-10-25 DIAGNOSIS — F10231 Alcohol dependence with withdrawal delirium: Secondary | ICD-10-CM | POA: Diagnosis present

## 2021-10-25 DIAGNOSIS — R410 Disorientation, unspecified: Secondary | ICD-10-CM | POA: Diagnosis not present

## 2021-10-25 DIAGNOSIS — N179 Acute kidney failure, unspecified: Secondary | ICD-10-CM | POA: Diagnosis not present

## 2021-10-25 DIAGNOSIS — D509 Iron deficiency anemia, unspecified: Secondary | ICD-10-CM | POA: Diagnosis present

## 2021-10-25 DIAGNOSIS — F05 Delirium due to known physiological condition: Secondary | ICD-10-CM | POA: Diagnosis present

## 2021-10-25 DIAGNOSIS — E43 Unspecified severe protein-calorie malnutrition: Secondary | ICD-10-CM | POA: Diagnosis present

## 2021-10-25 DIAGNOSIS — K760 Fatty (change of) liver, not elsewhere classified: Secondary | ICD-10-CM | POA: Diagnosis present

## 2021-10-25 LAB — CBC WITH DIFFERENTIAL/PLATELET
Abs Immature Granulocytes: 0.02 10*3/uL (ref 0.00–0.07)
Basophils Absolute: 0 10*3/uL (ref 0.0–0.1)
Basophils Relative: 0 %
Eosinophils Absolute: 0.1 10*3/uL (ref 0.0–0.5)
Eosinophils Relative: 1 %
HCT: 38.9 % (ref 36.0–46.0)
Hemoglobin: 12.1 g/dL (ref 12.0–15.0)
Immature Granulocytes: 0 %
Lymphocytes Relative: 15 %
Lymphs Abs: 0.9 10*3/uL (ref 0.7–4.0)
MCH: 28.5 pg (ref 26.0–34.0)
MCHC: 31.1 g/dL (ref 30.0–36.0)
MCV: 91.5 fL (ref 80.0–100.0)
Monocytes Absolute: 0.7 10*3/uL (ref 0.1–1.0)
Monocytes Relative: 11 %
Neutro Abs: 4.5 10*3/uL (ref 1.7–7.7)
Neutrophils Relative %: 73 %
Platelets: 144 10*3/uL — ABNORMAL LOW (ref 150–400)
RBC: 4.25 MIL/uL (ref 3.87–5.11)
RDW: 16.8 % — ABNORMAL HIGH (ref 11.5–15.5)
WBC: 6.2 10*3/uL (ref 4.0–10.5)
nRBC: 0 % (ref 0.0–0.2)

## 2021-10-25 LAB — RAPID URINE DRUG SCREEN, HOSP PERFORMED
Amphetamines: NOT DETECTED
Barbiturates: NOT DETECTED
Benzodiazepines: NOT DETECTED
Cocaine: POSITIVE — AB
Opiates: NOT DETECTED
Tetrahydrocannabinol: POSITIVE — AB

## 2021-10-25 LAB — COMPREHENSIVE METABOLIC PANEL
ALT: 15 U/L (ref 0–44)
AST: 20 U/L (ref 15–41)
Albumin: 2.8 g/dL — ABNORMAL LOW (ref 3.5–5.0)
Alkaline Phosphatase: 74 U/L (ref 38–126)
Anion gap: 9 (ref 5–15)
BUN: 19 mg/dL (ref 6–20)
CO2: 22 mmol/L (ref 22–32)
Calcium: 9.2 mg/dL (ref 8.9–10.3)
Chloride: 109 mmol/L (ref 98–111)
Creatinine, Ser: 1.36 mg/dL — ABNORMAL HIGH (ref 0.44–1.00)
GFR, Estimated: 47 mL/min — ABNORMAL LOW (ref >60)
Glucose, Bld: 84 mg/dL (ref 70–99)
Potassium: 3.5 mmol/L (ref 3.5–5.1)
Sodium: 140 mmol/L (ref 135–145)
Total Bilirubin: 0.8 mg/dL (ref 0.3–1.2)
Total Protein: 7.4 g/dL (ref 6.5–8.1)

## 2021-10-25 LAB — TROPONIN I (HIGH SENSITIVITY)
Troponin I (High Sensitivity): 12 ng/L (ref <18)
Troponin I (High Sensitivity): 9 ng/L (ref <18)

## 2021-10-25 LAB — URINALYSIS, ROUTINE W REFLEX MICROSCOPIC
Bilirubin Urine: NEGATIVE
Glucose, UA: NEGATIVE mg/dL
Ketones, ur: 5 mg/dL — AB
Nitrite: NEGATIVE
Protein, ur: 100 mg/dL — AB
Specific Gravity, Urine: 1.011 (ref 1.005–1.030)
WBC, UA: >50 WBC/hpf — ABNORMAL HIGH (ref 0–5)
pH: 7 (ref 5.0–8.0)

## 2021-10-25 LAB — CBC
HCT: 39.1 % (ref 36.0–46.0)
Hemoglobin: 12 g/dL (ref 12.0–15.0)
MCH: 28.6 pg (ref 26.0–34.0)
MCHC: 30.7 g/dL (ref 30.0–36.0)
MCV: 93.3 fL (ref 80.0–100.0)
Platelets: 154 10*3/uL (ref 150–400)
RBC: 4.19 MIL/uL (ref 3.87–5.11)
RDW: 16.7 % — ABNORMAL HIGH (ref 11.5–15.5)
WBC: 6.4 10*3/uL (ref 4.0–10.5)
nRBC: 0 % (ref 0.0–0.2)

## 2021-10-25 LAB — RESP PANEL BY RT-PCR (FLU A&B, COVID) ARPGX2
Influenza A by PCR: NEGATIVE
Influenza B by PCR: NEGATIVE
SARS Coronavirus 2 by RT PCR: NEGATIVE

## 2021-10-25 LAB — MAGNESIUM: Magnesium: 2.2 mg/dL (ref 1.7–2.4)

## 2021-10-25 LAB — MRSA NEXT GEN BY PCR, NASAL: MRSA by PCR Next Gen: DETECTED — AB

## 2021-10-25 LAB — TYPE AND SCREEN
ABO/RH(D): O POS
Antibody Screen: NEGATIVE

## 2021-10-25 LAB — AMMONIA: Ammonia: 15 umol/L (ref 9–35)

## 2021-10-25 LAB — PHOSPHORUS: Phosphorus: 3.7 mg/dL (ref 2.5–4.6)

## 2021-10-25 LAB — LACTIC ACID, PLASMA: Lactic Acid, Venous: 1.1 mmol/L (ref 0.5–1.9)

## 2021-10-25 LAB — ETHANOL: Alcohol, Ethyl (B): <10 mg/dL (ref <10)

## 2021-10-25 LAB — TSH: TSH: 1.789 u[IU]/mL (ref 0.350–4.500)

## 2021-10-25 MED ORDER — LORAZEPAM 1 MG PO TABS: 1.0000 mg | ORAL_TABLET | ORAL | Status: DC | PRN

## 2021-10-25 MED ORDER — PANTOPRAZOLE SODIUM 40 MG IV SOLR
40.0000 mg | Freq: Two times a day (BID) | INTRAVENOUS | Status: DC
  Administered 2021-10-25 – 2021-10-28 (×7): 40 mg via INTRAVENOUS
  Filled 2021-10-25 (×7): qty 40

## 2021-10-25 MED ORDER — CHLORDIAZEPOXIDE HCL 25 MG PO CAPS
50.0000 mg | ORAL_CAPSULE | Freq: Three times a day (TID) | ORAL | Status: DC
  Filled 2021-10-25 (×2): qty 2

## 2021-10-25 MED ORDER — ACETAMINOPHEN 325 MG PO TABS: 650.0000 mg | ORAL_TABLET | Freq: Four times a day (QID) | ORAL | Status: DC | PRN

## 2021-10-25 MED ORDER — HEPARIN SODIUM (PORCINE) 5000 UNIT/ML IJ SOLN
5000.0000 [IU] | Freq: Three times a day (TID) | INTRAMUSCULAR | Status: DC
  Administered 2021-10-25 – 2021-11-16 (×64): 5000 [IU] via SUBCUTANEOUS
  Filled 2021-10-25 (×63): qty 1

## 2021-10-25 MED ORDER — SODIUM CHLORIDE 0.9 % IV SOLN
2.0000 g | Freq: Once | INTRAVENOUS | Status: AC
  Administered 2021-10-25: 03:00:00 2 g via INTRAVENOUS
  Filled 2021-10-25: qty 20

## 2021-10-25 MED ORDER — ACETAMINOPHEN 650 MG RE SUPP: 650.0000 mg | Freq: Four times a day (QID) | RECTAL | Status: DC | PRN

## 2021-10-25 MED ORDER — SODIUM CHLORIDE 0.9 % IV SOLN
1.0000 g | INTRAVENOUS | Status: DC
  Administered 2021-10-26 – 2021-10-27 (×2): 1 g via INTRAVENOUS
  Filled 2021-10-25 (×2): qty 10

## 2021-10-25 MED ORDER — CARVEDILOL 3.125 MG PO TABS: 9.3750 mg | ORAL_TABLET | Freq: Two times a day (BID) | ORAL | Status: DC

## 2021-10-25 MED ORDER — CLONIDINE HCL 0.2 MG/24HR TD PTWK
0.2000 mg | MEDICATED_PATCH | TRANSDERMAL | Status: DC
  Administered 2021-10-25: 17:00:00 0.2 mg via TRANSDERMAL
  Filled 2021-10-25: qty 1

## 2021-10-25 MED ORDER — HYDRALAZINE HCL 20 MG/ML IJ SOLN
5.0000 mg | INTRAMUSCULAR | Status: DC | PRN
  Administered 2021-10-25 – 2021-10-28 (×2): 5 mg via INTRAVENOUS
  Filled 2021-10-25 (×2): qty 1

## 2021-10-25 MED ORDER — ORAL CARE MOUTH RINSE
15.0000 mL | Freq: Two times a day (BID) | OROMUCOSAL | Status: DC
  Administered 2021-10-25 – 2021-10-31 (×12): 15 mL via OROMUCOSAL

## 2021-10-25 MED ORDER — ADULT MULTIVITAMIN W/MINERALS CH
1.0000 | ORAL_TABLET | Freq: Every day | ORAL | Status: DC
  Administered 2021-10-25: 09:00:00 1 via ORAL
  Filled 2021-10-25 (×2): qty 1

## 2021-10-25 MED ORDER — CHLORHEXIDINE GLUCONATE CLOTH 2 % EX PADS
6.0000 | MEDICATED_PAD | Freq: Every day | CUTANEOUS | Status: DC
  Administered 2021-10-25 – 2021-11-16 (×23): 6 via TOPICAL

## 2021-10-25 MED ORDER — LORAZEPAM 2 MG/ML IJ SOLN
1.0000 mg | INTRAMUSCULAR | Status: DC | PRN
  Administered 2021-10-25 (×2): 2 mg via INTRAVENOUS
  Administered 2021-10-25: 04:00:00 1 mg via INTRAVENOUS
  Administered 2021-10-25 – 2021-10-26 (×5): 2 mg via INTRAVENOUS
  Filled 2021-10-25 (×6): qty 1
  Filled 2021-10-25: qty 2
  Filled 2021-10-25 (×2): qty 1

## 2021-10-25 MED ORDER — FOLIC ACID 1 MG PO TABS
1.0000 mg | ORAL_TABLET | Freq: Every day | ORAL | Status: DC
  Administered 2021-10-25: 09:00:00 1 mg via ORAL
  Filled 2021-10-25 (×2): qty 1

## 2021-10-25 MED ORDER — ENOXAPARIN SODIUM 40 MG/0.4ML IJ SOSY: 40.0000 mg | PREFILLED_SYRINGE | INTRAMUSCULAR | Status: DC

## 2021-10-25 MED ORDER — THIAMINE HCL 100 MG PO TABS
100.0000 mg | ORAL_TABLET | Freq: Every day | ORAL | Status: DC
  Filled 2021-10-25 (×2): qty 1

## 2021-10-25 MED ORDER — AMLODIPINE BESYLATE 10 MG PO TABS
10.0000 mg | ORAL_TABLET | Freq: Every day | ORAL | Status: DC
  Administered 2021-10-25: 13:00:00 10 mg via ORAL
  Filled 2021-10-25: qty 2
  Filled 2021-10-25: qty 1

## 2021-10-25 MED ORDER — THIAMINE HCL 100 MG/ML IJ SOLN
100.0000 mg | Freq: Every day | INTRAMUSCULAR | Status: DC
  Administered 2021-10-25: 09:00:00 100 mg via INTRAVENOUS
  Filled 2021-10-25: qty 2

## 2021-10-25 MED ORDER — MUPIROCIN 2 % EX OINT
1.0000 "application " | TOPICAL_OINTMENT | Freq: Two times a day (BID) | CUTANEOUS | Status: AC
  Administered 2021-10-25 – 2021-10-30 (×10): 1 via NASAL
  Filled 2021-10-25 (×2): qty 22

## 2021-10-25 MED ORDER — NICOTINE 14 MG/24HR TD PT24: 14.0000 mg | MEDICATED_PATCH | Freq: Every day | TRANSDERMAL | Status: DC

## 2021-10-25 MED ORDER — LACTATED RINGERS IV BOLUS
1000.0000 mL | Freq: Once | INTRAVENOUS | Status: AC
  Administered 2021-10-25: 13:00:00 1000 mL via INTRAVENOUS

## 2021-10-25 MED ORDER — LABETALOL HCL 5 MG/ML IV SOLN
10.0000 mg | Freq: Four times a day (QID) | INTRAVENOUS | Status: DC | PRN
  Administered 2021-10-25 – 2021-10-26 (×3): 10 mg via INTRAVENOUS
  Filled 2021-10-25 (×3): qty 4

## 2021-10-25 MED ORDER — DEXMEDETOMIDINE HCL IN NACL 400 MCG/100ML IV SOLN
0.4000 ug/kg/h | INTRAVENOUS | Status: DC
  Administered 2021-10-25: 13:00:00 0.4 ug/kg/h via INTRAVENOUS
  Administered 2021-10-25: 21:00:00 1 ug/kg/h via INTRAVENOUS
  Administered 2021-10-26 (×2): 1.2 ug/kg/h via INTRAVENOUS
  Administered 2021-10-26: 13:00:00 1 ug/kg/h via INTRAVENOUS
  Administered 2021-10-27: 15:00:00 0.9 ug/kg/h via INTRAVENOUS
  Administered 2021-10-27: 05:00:00 1.1 ug/kg/h via INTRAVENOUS
  Administered 2021-10-28 (×2): 1 ug/kg/h via INTRAVENOUS
  Administered 2021-10-28: 22:00:00 0.9 ug/kg/h via INTRAVENOUS
  Administered 2021-10-29: 22:00:00 0.8 ug/kg/h via INTRAVENOUS
  Administered 2021-10-29: 06:00:00 1.2 ug/kg/h via INTRAVENOUS
  Administered 2021-10-29: 17:00:00 0.8 ug/kg/h via INTRAVENOUS
  Filled 2021-10-25 (×13): qty 100

## 2021-10-25 MED ORDER — LACTATED RINGERS IV SOLN: INTRAVENOUS | Status: DC

## 2021-10-25 NOTE — ED Notes (Signed)
Pt is yelling and moving around in bed. Pt will not keep cardiac monitor cords on

## 2021-10-25 NOTE — ED Notes (Signed)
Pt has noticeable dried blood on lips. RN asked pt if she fell pt said, "I don't know." Then fell back to sleep

## 2021-10-25 NOTE — ED Notes (Signed)
Jacky Kindle MD requested pt be started on LR

## 2021-10-25 NOTE — ED Notes (Signed)
Pt is in bed yelling and banging her hand against the railing.

## 2021-10-25 NOTE — Progress Notes (Signed)
°  Transition of Care [facility]) Screening Note   Patient Details  Name: Sherry Baker Date of Birth: [birth year]   Transition of Care [facility]) CM/SW Contact:    Geralynn Ochs, LCSW Phone Number: 10/25/2021, 2:55 PM    Transition of Care Department [facility]) has reviewed patient. We will continue to monitor patient advancement through interdisciplinary progression rounds. If new patient transition needs arise, please place a TOC consult.

## 2021-10-25 NOTE — Consult Note (Signed)
NAME:  Sherry Baker, MRN:  [id number], DOB:  09-14-1966, LOS: 0 ADMISSION DATE:  10/24/2021, CONSULTATION DATE: 10/25/2021 REFERRING MD:  Terrilee Croak, MD , CHIEF COMPLAINT: Alcohol withdrawal  History of Present Illness:  Patient is encephalopathic so most of the history is taken from chart review.  55 year old female with alcohol dependence, alcoholic liver cirrhosis and polysubstance abuse was brought into the emergency department with altered mental status.  She was found to be agitated, screaming, work-up UDS positive for cocaine and cannabis, UA was positive for probable UTI.  She was admitted to hospitalist service for further evaluation but she started requiring more Ativan for delirium, remained encephalopathic, screaming.  PCCM was consulted for help with evaluation and management.  Pertinent  Medical History   Past Medical History:  Diagnosis Date   Alcohol abuse    Allergy    PCNS swelling   Arthritis    Bipolar 1 disorder ([place])    Cancer ([place]) 01/21/2017   rectal cancer   Cancer ([place])    Cirrhosis of liver ([place])    Depression    Genetic testing 03/24/2017   Ms. Nachtigal underwent genetic counseling and testing for hereditary cancer syndromes on 02/17/2017. Her results were negative for mutations in all 46 genes analyzed by [facility]'s 46-gene Common Hereditary Cancers Panel. Genes analyzed include: APC, ATM, AXIN2, BARD1, BMPR1A, BRCA1, BRCA2, BRIP1, CDH1, CDKN2A, CHEK2, CTNNA1, DICER1, EPCAM, GREM1, HOXB13, KIT, MEN1, MLH1, MSH2, MSH3, MSH6, MUTYH, NBN,   Hypertension      Significant Hospital Events: Including procedures, antibiotic start and stop dates in addition to other pertinent events     Interim History / Subjective:    Objective   Blood pressure (!) 189/102, pulse (!) 120, temperature (!) 97.5 F (36.4 C), temperature source Oral, resp. rate (!) 29, weight 45 kg, SpO2 100 %.        Intake/Output Summary (Last 24 hours) at 10/25/2021 1244 Last data  filed at 10/25/2021 0305 Gross per 24 hour  Intake 83.22 ml  Output 200 ml  Net -116.78 ml   Filed Weights   10/25/21 1200  Weight: 45 kg    Examination: General: Chronically ill-appearing female, lying on the bed HEENT: [place]/AT, eyes anicteric.  Severely dry mucous membranes, blood-tinged secretions around her lips Neuro: Awake, confused, restless and agitated Chest: Coarse breath sounds, no wheezes or rhonchi Heart: Regular rate and rhythm, no murmurs or gallops Abdomen: Soft, nontender, nondistended, colostomy bag is in place, bowel sounds present Skin: No rash  Resolved Hospital Problem list     Assessment & Plan:  Alcohol dependence, now in alcohol withdrawal with delirium Acute urinary tract infection CKD stage IIIa Alcoholic liver cirrhosis Chronic HFpEF Uncontrolled hypertension Polysubstance abuse History of rectal cancer, status postchemotherapy now with colostomy bag  Patient is actively withdrawing from alcohol, she is delirious, restless and agitated Continue CIWA Continue thiamine and folate Started on Librium 50 mg 3 times daily Continue Precedex infusion UA suggestive of UTI, continue IV ceftriaxone Follow-up urine culture Serum creatinine is at baseline, though patient looks severely dehydrated on physical exam We will give her 1 L of LR bolus followed by 125 cc an hour Monitor LFTs Watch for signs of withdrawal from substances, her UDS is positive for cocaine and cannabis Continue routine colostomy care  We will admit the patient to ICU for close monitoring of severe alcohol withdrawal with delirium  Best Practice (right click and "Reselect all SmartList Selections" daily)   Diet/type: NPO DVT prophylaxis:  systemic heparin GI prophylaxis: PPI Lines: N/A Foley:  N/A Code Status:  full code Last date of multidisciplinary goals of care discussion [Pending]  Labs   CBC: Recent Labs  Lab 10/25/21 0135 10/25/21 0356  WBC 6.2 6.4  NEUTROABS  4.5  --   HGB 12.1 12.0  HCT 38.9 39.1  MCV 91.5 93.3  PLT 144* 989    Basic Metabolic Panel: Recent Labs  Lab 10/24/21 2350 10/25/21 0356  NA 140  --   K 3.5  --   CL 109  --   CO2 22  --   GLUCOSE 84  --   BUN 19  --   CREATININE 1.36*  --   CALCIUM 9.2  --   MG  --  2.2  PHOS  --  3.7   GFR: Estimated Creatinine Clearance: 34 mL/min (A) (by C-G formula based on SCr of 1.36 mg/dL (H)). Recent Labs  Lab 10/24/21 2350 10/25/21 0135 10/25/21 0356  WBC  --  6.2 6.4  LATICACIDVEN 1.1  --   --     Liver Function Tests: Recent Labs  Lab 10/24/21 2350  AST 20  ALT 15  ALKPHOS 74  BILITOT 0.8  PROT 7.4  ALBUMIN 2.8*   No results for input(s): LIPASE, AMYLASE in the last 168 hours. Recent Labs  Lab 10/25/21 0356  AMMONIA 15    ABG    Component Value Date/Time   PHART 7.351 09/11/2021 0910   PCO2ART 53.7 (H) 09/11/2021 0910   PO2ART 104 09/11/2021 0910   HCO3 29.7 (H) 09/11/2021 0910   TCO2 31 09/11/2021 0910   ACIDBASEDEF 3.0 (H) 09/07/2021 0742   O2SAT 98.0 09/11/2021 0910     Coagulation Profile: No results for input(s): INR, PROTIME in the last 168 hours.  Cardiac Enzymes: No results for input(s): CKTOTAL, CKMB, CKMBINDEX, TROPONINI in the last 168 hours.  HbA1C: No results found for: HGBA1C  CBG: No results for input(s): GLUCAP in the last 168 hours.  Review of Systems:   Unable to obtain as patient is delirious and restless  Past Medical History:  She,  has a past medical history of Alcohol abuse, Allergy, Arthritis, Bipolar 1 disorder ([place]), Cancer ([place]) (01/21/2017), Cancer ([place]), Cirrhosis of liver ([place]), Depression, Genetic testing (03/24/2017), and Hypertension.   Surgical History:   Past Surgical History:  Procedure Laterality Date   ABDOMINAL PERINEAL BOWEL RESECTION N/A 06/18/2017   Procedure: ABDOMINAL PERINEAL RESECTION ERAS PATHWAY;  Surgeon: Leighton Ruff, MD;  Location: [facility];  Service: General;  Laterality: N/A;    COLON SURGERY     COLONOSCOPY WITH PROPOFOL Left 01/21/2017   Procedure: COLONOSCOPY WITH PROPOFOL;  Surgeon: Otis Brace, MD;  Location: [facility];  Service: Gastroenterology;  Laterality: Left;   FRACTURE SURGERY     MANDIBLE FRACTURE SURGERY       Social History:   reports that she has been smoking cigarettes. She has a 19.50 pack-year smoking history. She has never used smokeless tobacco. She reports current alcohol use. She reports current drug use. Drugs: "Crack" cocaine and Cocaine.   Family History:  Her family history includes Cancer (age of onset: 80) in her mother; Cancer - Other in her maternal aunt.   Allergies Allergies  Allergen Reactions   Penicillins Hives and Swelling    Tolerated Unasyn, Rocephin Did it involve swelling of the face/tongue/throat, SOB, or low BP? Yes Did it involve sudden or severe rash/hives, skin peeling, or any reaction on the inside of your  mouth or nose? No Did you need to seek medical attention at a hospital or doctor's office? No When did it last happen?  <10 years   If all above answers are "NO", may proceed with cephalosporin use.      Home Medications  Prior to Admission medications   Medication Sig Start Date End Date Taking? Authorizing Provider  acetaminophen (TYLENOL) 325 MG tablet Take 2 tablets (650 mg total) by mouth every 6 (six) hours as needed for mild pain, headache or fever. 09/30/21   Samella Parr, NP  amLODipine (NORVASC) 10 MG tablet Take 1 tablet (10 mg total) by mouth daily. 06/30/21   Oswald Hillock, MD  busPIRone (BUSPAR) 15 MG tablet Take 1 tablet (15 mg total) by mouth 2 (two) times daily. 11/15/20   Argentina Donovan, PA-C  carvedilol (COREG) 3.125 MG tablet Take 3 tablets (9.375 mg total) by mouth 2 (two) times daily with a meal. 09/30/21   Samella Parr, NP  citalopram (CELEXA) 10 MG tablet Take 1 tablet (10 mg total) by mouth daily. 04/09/21   Pokhrel, Corrie Mckusick, MD  diclofenac Sodium (VOLTAREN) 1 % GEL  Apply 4 g topically 4 (four) times daily. 09/30/21   Samella Parr, NP  gabapentin (NEURONTIN) 100 MG capsule Take 1 capsule (100 mg total) by mouth 3 (three) times daily. 04/08/21   Pokhrel, Corrie Mckusick, MD  hydrOXYzine (ATARAX/VISTARIL) 10 MG tablet Take 1 tablet (10 mg total) by mouth 3 (three) times daily as needed. As needed for acute anxiety 11/15/20   Argentina Donovan, PA-C  Multiple Vitamin (MULTIVITAMIN WITH MINERALS) TABS tablet Place 1 tablet into feeding tube daily. Patient taking differently: Take 1 tablet by mouth daily. 04/09/21   Pokhrel, Corrie Mckusick, MD  omeprazole (PRILOSEC) 40 MG capsule Take 1 capsule (40 mg total) by mouth daily. 11/15/20   Argentina Donovan, PA-C  thiamine 100 MG tablet Take 1 tablet (100 mg total) by mouth daily. 07/01/21   Oswald Hillock, MD  traMADol (ULTRAM) 50 MG tablet Take 1 tablet (50 mg total) by mouth every 6 (six) hours as needed for moderate pain. 09/30/21   Samella Parr, NP  Vitamin D3 (VITAMIN D) 25 MCG tablet Take 1 tablet (1,000 Units total) by mouth daily. 09/30/21   Samella Parr, NP  ziprasidone (GEODON) 40 MG capsule Take 1 capsule (40 mg total) by mouth 2 (two) times daily with a meal. 11/06/11 01/09/12  Daleen Bo, MD     Critical care time:      Total critical care time: 41 minutes  Performed by: [place] care time was exclusive of separately billable procedures and treating other patients.   Critical care was necessary to treat or prevent imminent or life-threatening deterioration.   Critical care was time spent personally by me on the following activities: development of treatment plan with patient and/or surrogate as well as nursing, discussions with consultants, evaluation of patient's response to treatment, examination of patient, obtaining history from patient or surrogate, ordering and performing treatments and interventions, ordering and review of laboratory studies, ordering and review of radiographic studies,  pulse oximetry and re-evaluation of patient's condition.   Jacky Kindle MD [facility] See Amion for pager If no response to pager, please call [phone] until 7pm After 7pm, Please call E-link [phone]

## 2021-10-25 NOTE — ED Notes (Signed)
RN found pt standing at the end of bed upon entering room. RN and CN assisted pt back into bed. Notified provider for additional medication to calm pt.

## 2021-10-25 NOTE — ED Notes (Signed)
Pt removed gown and pulling at cords. Pt is unable to redirected. Pt is yelling and banging hand on rails.

## 2021-10-25 NOTE — ED Notes (Signed)
Pt scooted to edge of bed in an attempt to get out the bed. RN informed pt to scoot back in bed which pt did with assistance. Pt was educated to stay in bed for safety.

## 2021-10-25 NOTE — ED Notes (Signed)
GI provider, attending, and EDP at the bedside. Upon assessment of bottom pt has redness and opening in crack of buttocks. Pt has hx of rectal cancer. GI provider took pictures. RN informed GI provider pt was noted to have this previously arriving to ED.

## 2021-10-25 NOTE — Progress Notes (Signed)
PROGRESS NOTE  Sherry Baker  DOB: [birth year]  PCP: Kathyrn Lass RWE:[id number]  DOA: 10/24/2021  LOS: 0 days  Hospital Day: 2  Chief Complaint  Patient presents with   Altered Mental Status    From home with AMS. Last unkown well time. Not coherent. Moaning, yelling, flailing arms and not following commands    Brief narrative: Sherry Baker is a 55 y.o. female with PMH significant for chronic alcohol abuse, alcoholic liver cirrhosis, history of complicated alcohol withdrawal requiring intubation, cocaine use, tobacco use disorder, CKD stage IIIa, chronic diastolic CHF, hypertension, anxiety, depression, bipolar disorder, rectal cancer s/p resection with ostomy.   Patient presented to the ED on 12/22 for altered mental status. Last hospitalized in November 6195 for complicated alcohol withdrawal and required intubation and mechanical ventilation.  On arrival to the ED, patient was not coherent, moaning yelling, flailing arms and not following commands.  She was given Ativan Blood pressure elevated to 192/110 Blood work mostly unremarkable Blood alcohol level undetectable Urine drug screen positive for cocaine and THC UA with large amount of leukocytes, greater than 50 WBCs, and many bacteria.  Chest x-ray not suggestive of pneumonia CT head negative for acute intracranial process.  Patient was given IV ceftriaxone.   Subjective: Patient was seen and examined this morning in the ED.  Patient was delirious, restless, hard to control.  She was just given 1 dose of IV Ativan without help.  1 more dose was added. No family at bedside. Colostomy with brown liquidy stool  Assessment/Plan: Acute toxic metabolic encephalopathy/ delirium  -Present etiology unclear.  Likely multifactorial from alcohol withdrawal, polysubstance abuse, and UTI.   -UDS positive for cocaine and THC.   -CT head negative for acute intracranial process.   -In the ED, she was moving all extremities spontaneously, no  focal weakness.  She continues to do the same this morning.  Requiring frequent IV Ativan.  May need Precedex drip.  We will get critical care consult. -Ammonia level normal, TSH level normal  Chronic alcoholism Alcohol withdrawal -Chronic alcoholic brought in with altered mental status with low undetectable blood alcohol level. -Alcohol withdrawal could be causing her encephalopathy.  Lactic acid level normal which probably suggests that she did not have withdrawal seizure. -Continue CIWA protocol with IV Ativan as needed -Continue thiamine, folate, and multivitamin.     UTI -UA with evidence of pyuria and bacteriuria.  No fever, leukocytosis, or lactic acidosis to suggest sepsis. -Continue empiric ceftriaxone.  Urine culture pending.   Suspect acute upper GI bleed -No active hematemesis in the ED but patient noted to have a very thick layer of dried blood around her lips.  Hemoglobin is stable and she is currently hemodynamically stable.  She had a colonoscopy in 2018 but no prior EGD results in the chart.   -Not on blood thinners at home.  Has liver cirrhosis but platelet count is not terribly low.  Check INR.   -[facility] consulted. -Currently on Protonix IV  -Currently n.p.o.  History of rectal adenocarcinoma -Diagnosed in 2018.  Was T3N 0 days.  Received radiation.  Treated with Xeloda.  Underwent APR with colostomy creation.  Had wound issues following surgery. -Seems to have nonhealing right heel wound noted in this presentation.  Picture as below.  Alcoholic liver cirrhosis -Not sure if she has a history of varices 2.  GI consulted. -Liver enzymes normal. -Platelet level slightly low.  Check INR. No results for input(s): INR in the last  168 hours.  CKD stage IIIa Creatinine 1.3, stable. Recent Labs    09/10/21 0430 09/11/21 0228 09/12/21 0223 09/14/21 0818 09/15/21 0600 09/18/21 0128 09/19/21 0459 09/20/21 0418 09/25/21 1133 10/24/21 2350  BUN 17 19 18 14  24*  49* 58* 52* 55* 19  CREATININE 1.39* 1.55* 1.28* 1.35* 1.35* 1.48* 1.61* 1.45* 1.51* 1.36*   Chronic diastolic CHF Essential hypertension -No signs of volume overload. -Monitor volume status and blood pressure closely. -Home meds include carvedilol 9.375 mg twice daily amlodipine 10 mg daily. -I would resume the same. -IV hydralazine as needed.    Anxiety, depression, bipolar disorder -Home meds include buspirone 15 mg twice daily, Celexa 10 mg daily, Neurontin 100 mg 3 times daily, hydroxyzine 10 mg 3 times daily as needed, tramadol 50 mg every 6 hours as needed -I will hold these medications while she is altered.  Polysubstance abuse -Alcohol, tobacco, cocaine,   Mobility: Once mental status improves, can be encouraged to ambulate Living condition:  Goals of care:   Code Status: Full Code  Nutritional status: There is no height or weight on file to calculate BMI.      Diet:  Diet Order             Diet NPO time specified  Diet effective now                  DVT prophylaxis:  Place and maintain sequential compression device Start: 10/25/21 0342   Antimicrobials: None Fluid: None Consultants: Critical care, GI Family Communication: None at bedside  Status is: Inpatient  Continue in-hospital care because: May need GI intervention.  May need Precedex drip Level of care: Progressive   Dispo: The patient is from: Home              Anticipated d/c is to: Unclear at this time              Patient currently is not medically stable to d/c.   Difficult to place patient No     Infusions:   [START ON 10/26/2021] cefTRIAXone (ROCEPHIN)  IV      Scheduled Meds:  amLODipine  10 mg Oral Daily   carvedilol  9.375 mg Oral BID WC   folic acid  1 mg Oral Daily   multivitamin with minerals  1 tablet Oral Daily   pantoprazole (PROTONIX) IV  40 mg Intravenous Q12H   thiamine  100 mg Oral Daily   Or   thiamine  100 mg Intravenous Daily    PRN meds: acetaminophen  **OR** acetaminophen, hydrALAZINE, LORazepam **OR** LORazepam   Antimicrobials: Anti-infectives (From admission, onward)    Start     Dose/Rate Route Frequency Ordered Stop   10/26/21 0600  cefTRIAXone (ROCEPHIN) 1 g in sodium chloride 0.9 % 100 mL IVPB        1 g 200 mL/hr over 30 Minutes Intravenous Every 24 hours 10/25/21 0328     10/25/21 0230  cefTRIAXone (ROCEPHIN) 2 g in sodium chloride 0.9 % 100 mL IVPB        2 g 200 mL/hr over 30 Minutes Intravenous  Once 10/25/21 0223 10/25/21 0305       Objective: Vitals:   10/25/21 1050 10/25/21 1112  BP: (!) 212/125 (!) 189/102  Pulse: 93 (!) 120  Resp: (!) 29   Temp:    SpO2: 100%     Intake/Output Summary (Last 24 hours) at 10/25/2021 1210 Last data filed at 10/25/2021 0305 Gross per 24 hour  Intake 83.22 ml  Output 200 ml  Net -116.78 ml   There were no vitals filed for this visit. Weight change:  There is no height or weight on file to calculate BMI.   Physical Exam: General exam: Middle-aged Caucasian female.  Chronically sick looking, currently restless Skin: No rashes, lesions or ulcers. HEENT: Atraumatic, normocephalic, no obvious bleeding Lungs: Clear to auscultation bilaterally CVS: Tachycardic with restlessness GI/Abd soft, nontender, nondistended, bowel sound present, colostomy bag with soft/liquid stool CNS: Restless, agitated, murmuring, unable to have a conversation Psychiatry: Sad affect Extremities: No pedal edema, no calf tenderness  Data Review: I have personally reviewed the laboratory data and studies available.  F/u labs  Unresulted Labs (From admission, onward)     Start     Ordered   10/26/21 0500  Protime-INR  Tomorrow morning,   R        10/25/21 0827   10/26/21 0500  CBC with Differential/Platelet  Tomorrow morning,   R        10/25/21 0827   10/26/21 7673  Basic metabolic panel  Tomorrow morning,   R        10/25/21 0827   10/25/21 0223  Urine Culture  Once,   STAT       Question:   Indication  Answer:  Sepsis   10/25/21 0223   10/24/21 2307  CBC with Differential  Once,   STAT        10/24/21 2307            Signed, Terrilee Croak, MD [facility] 10/25/2021

## 2021-10-25 NOTE — ED Notes (Signed)
Pt was attempting to get out of bed again. Two RN's assisted pt back into bed. Pt is yelling and moving all over the bed.

## 2021-10-25 NOTE — Consult Note (Addendum)
[facility] Consult: 9:38 AM 10/25/2021  LOS: 0 days    Referring Provider: Dr Pietro Cassis  Primary Care Physician: Supposed to establish with [facility] Primary Gastroenterologist:  Dr. Alessandra Bevels inpt, no follow up.   Oncologist Dr. Ammie Dalton    Reason for Consultation: Dried blood on lips.   HPI: Sherry Baker is a 55 y.o. female.  Cancer of the tongue, 04/2017, treated with Xeloda.  EtOH, polysubstance abuse.  Alcohol associated seizures.  Iron deficiency anemia and conjunction with rectal cancer.  Hypertension.  CKD 3.  Cirrhosis.  Diastolic CHF.  Bipolar disorder. 02/2014 abdominal ultrasound with fatty liver. 01/2017 colonoscopy as inpatient for hematochezia and rectal mass.  Endoscopic findings included large, polypoid, ulcerated, infiltrative rectal mass involving two thirds of the lumen circumference with oozing. 7 mm sigmoid polyp removed.  Sigmoid diverticulosis. Biopsy of rectal mass confirmed adenocarcinoma.  Polyp pathology was benign polypoid colonic mucosa Tumor staged as T3N0.  Treated with radiation 5 -04/2017.  Treated with Xeloda.  Following surgery adjuvant chemotherapy not recommended due to nonhealing perineal wound. 06/2017 AP resection w colosotomy.  Surgical note mentioning significant adhesions and scar tissue. Tumor extended to the perirectal connective tissue.  All 15 lymph nodes benign.  Had wound dehiscence following surgery. 12/2018 CTAP with postsurgical/postradiation changes.  Colostomy positioned in left abdomen.  Liver, gallbladder, biliary tree, pancreas, spleen, stomach, small bowel normal  Admitted 11/3 -09/30/2021 to [facility] for management of acute alcohol withdrawal, hypotension, tachycardia.  She had been found in the community unresponsive  behind a dumpster surrounded by beer cans.  Toxicology positive for cocaine.  She required intubation for airway protection.  Precedex used but discontinued due to bradycardia.  Treated with fentanyl drip, Librium, Ativan, vancomycin, Unasyn.  Encephalopathy improved.  Acute dysphagia resolved and she was tolerating regular diet for 10 days prior to discharge.  Patient transported to the hospital for altered mental status.  She was moaning, agitated, completely disoriented, confused. Initial pressures 192/110. On initial exam there was dried blood around her mouth but she is not had any history of hematemesis, melena, hematochezia per reports.  Also noted: sacral decubitus.    Tox screen positive for cocaine, THC.  EtOH level <10. LFTs, lipase normal.  Albumin 2.8 WBCs normal. Hgb 12, normal MCV.  Normal platelets. UA with significant bacteria and WBCs. No FOBT testing.   CT head negative for acute intracranial process.  Medication compliance unknown.  Meds include omeprazole 40 mg/daily She has been started on Protonix 40 mg iv bid along with Rocephin, CIWA protocol with prn Ativan    Past Medical History:  Diagnosis Date   Alcohol abuse    Allergy    PCNS swelling   Arthritis    Bipolar 1 disorder ([place])    Cancer ([place]) 01/21/2017   rectal cancer   Cancer ([place])    Cirrhosis of liver ([place])    Depression    Genetic testing 03/24/2017   Ms. Schreurs underwent genetic counseling and testing for hereditary cancer syndromes on 02/17/2017. Her results  were negative for mutations in all 46 genes analyzed by [facility]'s 46-gene Common Hereditary Cancers Panel. Genes analyzed include: APC, ATM, AXIN2, BARD1, BMPR1A, BRCA1, BRCA2, BRIP1, CDH1, CDKN2A, CHEK2, CTNNA1, DICER1, EPCAM, GREM1, HOXB13, KIT, MEN1, MLH1, MSH2, MSH3, MSH6, MUTYH, NBN,   Hypertension     Past Surgical History:  Procedure Laterality Date   ABDOMINAL PERINEAL BOWEL RESECTION N/A 06/18/2017   Procedure: ABDOMINAL PERINEAL  RESECTION ERAS PATHWAY;  Surgeon: Leighton Ruff, MD;  Location: [facility];  Service: General;  Laterality: N/A;   COLON SURGERY     COLONOSCOPY WITH PROPOFOL Left 01/21/2017   Procedure: COLONOSCOPY WITH PROPOFOL;  Surgeon: Otis Brace, MD;  Location: [facility];  Service: Gastroenterology;  Laterality: Left;   FRACTURE SURGERY     MANDIBLE FRACTURE SURGERY      Prior to Admission medications   Medication Sig Start Date End Date Taking? Authorizing Provider  acetaminophen (TYLENOL) 325 MG tablet Take 2 tablets (650 mg total) by mouth every 6 (six) hours as needed for mild pain, headache or fever. 09/30/21   Samella Parr, NP  amLODipine (NORVASC) 10 MG tablet Take 1 tablet (10 mg total) by mouth daily. 06/30/21   Oswald Hillock, MD  busPIRone (BUSPAR) 15 MG tablet Take 1 tablet (15 mg total) by mouth 2 (two) times daily. 11/15/20   Argentina Donovan, PA-C  carvedilol (COREG) 3.125 MG tablet Take 3 tablets (9.375 mg total) by mouth 2 (two) times daily with a meal. 09/30/21   Samella Parr, NP  citalopram (CELEXA) 10 MG tablet Take 1 tablet (10 mg total) by mouth daily. 04/09/21   Pokhrel, Corrie Mckusick, MD  diclofenac Sodium (VOLTAREN) 1 % GEL Apply 4 g topically 4 (four) times daily. 09/30/21   Samella Parr, NP  gabapentin (NEURONTIN) 100 MG capsule Take 1 capsule (100 mg total) by mouth 3 (three) times daily. 04/08/21   Pokhrel, Corrie Mckusick, MD  hydrOXYzine (ATARAX/VISTARIL) 10 MG tablet Take 1 tablet (10 mg total) by mouth 3 (three) times daily as needed. As needed for acute anxiety 11/15/20   Argentina Donovan, PA-C  Multiple Vitamin (MULTIVITAMIN WITH MINERALS) TABS tablet Place 1 tablet into feeding tube daily. Patient taking differently: Take 1 tablet by mouth daily. 04/09/21   Pokhrel, Corrie Mckusick, MD  omeprazole (PRILOSEC) 40 MG capsule Take 1 capsule (40 mg total) by mouth daily. 11/15/20   Argentina Donovan, PA-C  thiamine 100 MG tablet Take 1 tablet (100 mg total) by mouth daily. 07/01/21   Oswald Hillock, MD  traMADol (ULTRAM) 50 MG tablet Take 1 tablet (50 mg total) by mouth every 6 (six) hours as needed for moderate pain. 09/30/21   Samella Parr, NP  Vitamin D3 (VITAMIN D) 25 MCG tablet Take 1 tablet (1,000 Units total) by mouth daily. 09/30/21   Samella Parr, NP  ziprasidone (GEODON) 40 MG capsule Take 1 capsule (40 mg total) by mouth 2 (two) times daily with a meal. 11/06/11 01/09/12  Daleen Bo, MD    Scheduled Meds:  folic acid  1 mg Oral Daily   multivitamin with minerals  1 tablet Oral Daily   pantoprazole (PROTONIX) IV  40 mg Intravenous Q12H   thiamine  100 mg Oral Daily   Or   thiamine  100 mg Intravenous Daily   Infusions:  [START ON 10/26/2021] cefTRIAXone (ROCEPHIN)  IV     PRN Meds: acetaminophen **OR** acetaminophen, hydrALAZINE, LORazepam **OR** LORazepam   Allergies as of 10/24/2021 - Review  Complete 10/24/2021  Allergen Reaction Noted   Penicillins Hives and Swelling 06/06/2019    Family History  Problem Relation Age of Onset   Cancer Mother 64       Originating in the abdomen, otherwise unknwon   Cancer - Other Maternal Aunt        Throat    Social History   Socioeconomic History   Marital status: Single    Spouse name: Not on file   Number of children: Not on file   Years of education: Not on file   Highest education level: Not on file  Occupational History   Not on file  Tobacco Use   Smoking status: Every Day    Packs/day: 0.50    Years: 39.00    Pack years: 19.50    Types: Cigarettes   Smokeless tobacco: Never  Vaping Use   Vaping Use: Never used  Substance and Sexual Activity   Alcohol use: Yes    Comment: 07/28/2017 "40oz beer/day; if I have it"   Drug use: Yes    Types: "Crack" cocaine, Cocaine    Comment: 07/28/2017 "nothing in 3 wks"   Sexual activity: Not Currently    Birth control/protection: Abstinence  Other Topics Concern   Not on file  Social History Narrative   ** Merged History Encounter **        Social Determinants of Health   Financial Resource Strain: Not on file  Food Insecurity: Not on file  Transportation Needs: Not on file  Physical Activity: Not on file  Stress: Not on file  Social Connections: Not on file  Intimate Partner Violence: Not on file    REVIEW OF SYSTEMS: Not able to obtain a review of systems from this patient.   PHYSICAL EXAM: Vital signs in last 24 hours: Vitals:   10/25/21 0912 10/25/21 0915  BP: (!) 174/121 (!) 174/121  Pulse: (!) 119 (!) 113  Resp:  18  Temp:    SpO2:  98%   Wt Readings from Last 3 Encounters:  09/27/21 45 kg  06/25/21 43.9 kg  04/08/21 49.4 kg    General: Patient alternates between resting and agitation.  Not able to provide any history or answer questions Head: No facial or head trauma. Eyes: No scleral icterus.  Conjunctiva not pale. Ears: Does not seem to have hearing issues but it is hard to say given her mental status. Nose: No discharge Mouth: Fine linear rim of dark red, blood appearing, outlines the upper and lower lips.  No active bleeding.  Poor dentition and patient not opening her mouth out wide but there does not appear to be any red blood or lesions in the mouth. Neck: No masses Lungs: No labored breathing or cough.  Lungs clear bilaterally Heart: RRR. Abdomen: Soft without tenderness.  Thin.  Small amount of brown stool in the colostomy..   Rectal:    In the picture the upper left hand lesion is a decubitus ulcer.  The lower lesion is the rectum.  There is no anal sphincter.  There is a firm 2 inch long 1/2 inch mass that is firm, not actively bleeding Musc/Skeltl: Muscle wasting in arms and legs. Extremities: No extremity edema. Neurologic: Patient essentially nonverbal, swings between resting and flailing arm agitation.  Not able to elicit asterixis.  No tremors. Skin: Various purpura on the upper extremities. Nodes: No cervical adenopathy Psych:, Agitation.  Following commands.  Intake/Output  from previous day: 12/22 0701 - 12/23 0700 In: 83.2 [IV  Piggyback:83.2] Out: 200 [Urine:200] Intake/Output this shift: No intake/output data recorded.  LAB RESULTS: Recent Labs    10/25/21 0135 10/25/21 0356  WBC 6.2 6.4  HGB 12.1 12.0  HCT 38.9 39.1  PLT 144* 154   BMET Lab Results  Component Value Date   NA 140 10/24/2021   NA 135 09/25/2021   NA 132 (L) 09/20/2021   K 3.5 10/24/2021   K 3.8 09/25/2021   K 4.7 09/20/2021   CL 109 10/24/2021   CL 101 09/25/2021   CL 100 09/20/2021   CO2 22 10/24/2021   CO2 26 09/25/2021   CO2 23 09/20/2021   GLUCOSE 84 10/24/2021   GLUCOSE 107 (H) 09/25/2021   GLUCOSE 96 09/20/2021   BUN 19 10/24/2021   BUN 55 (H) 09/25/2021   BUN 52 (H) 09/20/2021   CREATININE 1.36 (H) 10/24/2021   CREATININE 1.51 (H) 09/25/2021   CREATININE 1.45 (H) 09/20/2021   CALCIUM 9.2 10/24/2021   CALCIUM 9.1 09/25/2021   CALCIUM 9.8 09/20/2021   LFT Recent Labs    10/24/21 2350  PROT 7.4  ALBUMIN 2.8*  AST 20  ALT 15  ALKPHOS 74  BILITOT 0.8   PT/INR Lab Results  Component Value Date   INR 1.0 06/23/2021   INR 1.15 07/28/2017   INR 1.15 07/10/2017   Hepatitis Panel No results for input(s): HEPBSAG, HCVAB, HEPAIGM, HEPBIGM in the last 72 hours. C-Diff No components found for: CDIFF Lipase     Component Value Date/Time   LIPASE 29 06/24/2021 0309    Drugs of Abuse     Component Value Date/Time   LABOPIA NONE DETECTED 10/25/2021 0145   COCAINSCRNUR POSITIVE (A) 10/25/2021 0145   LABBENZ NONE DETECTED 10/25/2021 0145   AMPHETMU NONE DETECTED 10/25/2021 0145   THCU POSITIVE (A) 10/25/2021 0145   LABBARB NONE DETECTED 10/25/2021 0145     RADIOLOGY STUDIES: CT Head Wo Contrast  Result Date: 10/25/2021 CLINICAL DATA:  Mental status change, unknown cause. EXAM: CT HEAD WITHOUT CONTRAST TECHNIQUE: Contiguous axial images were obtained from the base of the skull through the vertex without intravenous contrast. COMPARISON:   09/05/2021. FINDINGS: Brain: No acute intracranial hemorrhage, midline shift or mass effect. No extra-axial fluid collection. Periventricular and subcortical white matter hypodensities are noted bilaterally. There is no hydrocephalus. There is a hypodensity in the basal ganglia on the right, suggesting old lacunar infarct. Mild atrophy is noted. Vascular: Atherosclerotic calcification of the carotid siphons. No hyperdense vessel. Skull: Normal. Negative for fracture or focal lesion. Sinuses/Orbits: There is partial opacification of the mastoid air cells on the left. Mucosal thickening is noted in the maxillary sinuses bilaterally and ethmoid air cells on the right. No acute orbital abnormality. Other: None. IMPRESSION: 1. No acute intracranial process. 2. 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 3. Bilateral maxillary and right ethmoid sinus disease. 4. Partial opacification of the mastoid air cells on the left, unchanged from the prior exam. Electronically Signed   By: Brett Fairy M.D.   On: 10/25/2021 01:29   DG Chest Port 1 View  Result Date: 10/24/2021 CLINICAL DATA:  Altered mental status. EXAM: PORTABLE CHEST 1 VIEW COMPARISON:  09/09/2021. FINDINGS: The heart size and mediastinal contours are within normal limits. No consolidation, effusion, or pneumothorax. No acute osseous abnormality. IMPRESSION: No acute cardiopulmonary process. Electronically Signed   By: Brett Fairy M.D.   On: 10/24/2021 23:30      IMPRESSION:  ETOH use disorder.  Polysubstance abuse, chronic  Staff observation of dried blood at the mouth.  Rectal cancer 2018, treated with XRT, Xeloda and ultimately AP resection that same year.  Has not followed up with GI so no surveillance colonoscopies since 2018 diagnosis of cancer.  Abnormal rectal exam with firm mass visible/palpable at rectum.  Encephalopathy.  Ammonia level 15.  UTI.  Rocephin  initiated.  Fatty liver per ultrasound in 2015.  Liver appeared normal on CT scan in February 2020.   PLAN:       EGD, colonoscopy in next few days.  Timing TBD.  Continue the Protonix 40 iv bid.   CIWA protocol.  When able to safely take p.o., recommend clear liquids.    Ultrasound limited to assess for liver dz.      Addendum at 12:41 PM.  Got a call from the ultrasound tech.  Patient is too combative to undergo successful ultrasound.  Canceled this.  We can repeat this once the patient has calm down.   Azucena Freed  10/25/2021, 9:38 AM Phone [phone]

## 2021-10-25 NOTE — ED Notes (Signed)
Pt was sleep but she woke up yelling and pulling at rails on bed. Pt bed was adjusted to trendelenburg.

## 2021-10-25 NOTE — H&P (Signed)
History and Physical    Sherry Baker LMR:[id number] DOB: 06/15/1966 DOA: 10/24/2021  PCP: Pcp, No Patient coming from: Home  Chief Complaint: Altered mental status  HPI: Sherry Baker is a 55 y.o. female with medical history significant of alcohol abuse, complicated alcohol withdrawal requiring intubation, cocaine use, tobacco use disorder, CKD stage IIIa, chronic diastolic CHF, liver cirrhosis, hypertension, anxiety, depression, bipolar disorder, rectal cancer s/p resection with ostomy.  Admitted in November 3578 for complicated alcohol withdrawal and required intubation and mechanical ventilation.  She presents to the ED from home for evaluation of altered mental status.  Unknown when she was last seen well. On arrival to the ED, patient not coherent.  Morning, yelling, flailing arms, and not following commands.  Hypertensive with blood pressure 192/110.  She was given Ativan.  Not febrile or tachycardic.  Labs showing no leukocytosis.  Platelet count 144k.  Creatinine 1.3, stable.  LFTs normal.  Blood ethanol level undetectable.  COVID and influenza PCR negative.  Lactic acid normal.  Initial high-sensitivity troponin negative, repeat pending.  UDS positive for cocaine and THC.  UA with large amount of leukocytes, greater than 50 WBCs, and many bacteria.  Urine culture pending.  Chest x-ray not suggestive of pneumonia.  CT head negative for acute intracranial process. Patient was given ceftriaxone.  No history could be obtained from the patient given her altered mental status.  She was resting comfortably when I entered the room but then woke up, started moaning and flailing her arms.  Appeared tremulous.  She did not answer any questions or follow any commands.  Review of Systems:  All systems reviewed and apart from history of presenting illness, are negative.  Past Medical History:  Diagnosis Date   Alcohol abuse    Allergy    PCNS swelling   Arthritis    Bipolar 1 disorder ([place])     Cancer ([place]) 01/21/2017   rectal cancer   Cancer ([place])    Cirrhosis of liver ([place])    Depression    Genetic testing 03/24/2017   Ms. Pincock underwent genetic counseling and testing for hereditary cancer syndromes on 02/17/2017. Her results were negative for mutations in all 46 genes analyzed by [facility]'s 46-gene Common Hereditary Cancers Panel. Genes analyzed include: APC, ATM, AXIN2, BARD1, BMPR1A, BRCA1, BRCA2, BRIP1, CDH1, CDKN2A, CHEK2, CTNNA1, DICER1, EPCAM, GREM1, HOXB13, KIT, MEN1, MLH1, MSH2, MSH3, MSH6, MUTYH, NBN,   Hypertension     Past Surgical History:  Procedure Laterality Date   ABDOMINAL PERINEAL BOWEL RESECTION N/A 06/18/2017   Procedure: ABDOMINAL PERINEAL RESECTION ERAS PATHWAY;  Surgeon: Leighton Ruff, MD;  Location: [facility];  Service: General;  Laterality: N/A;   COLON SURGERY     COLONOSCOPY WITH PROPOFOL Left 01/21/2017   Procedure: COLONOSCOPY WITH PROPOFOL;  Surgeon: Otis Brace, MD;  Location: [facility];  Service: Gastroenterology;  Laterality: Left;   FRACTURE SURGERY     MANDIBLE FRACTURE SURGERY       reports that she has been smoking cigarettes. She has a 19.50 pack-year smoking history. She has never used smokeless tobacco. She reports current alcohol use. She reports current drug use. Drugs: "Crack" cocaine and Cocaine.  Allergies  Allergen Reactions   Penicillins Hives and Swelling    Tolerated Unasyn, Rocephin Did it involve swelling of the face/tongue/throat, SOB, or low BP? Yes Did it involve sudden or severe rash/hives, skin peeling, or any reaction on the inside of your mouth or nose? No Did you need to seek medical  attention at a hospital or doctor's office? No When did it last happen?  <10 years   If all above answers are "NO", may proceed with cephalosporin use.     Family History  Problem Relation Age of Onset   Cancer Mother 5       Originating in the abdomen, otherwise unknwon   Cancer - Other Maternal Aunt        Throat     Prior to Admission medications   Medication Sig Start Date End Date Taking? Authorizing Provider  acetaminophen (TYLENOL) 325 MG tablet Take 2 tablets (650 mg total) by mouth every 6 (six) hours as needed for mild pain, headache or fever. 09/30/21   Samella Parr, NP  amLODipine (NORVASC) 10 MG tablet Take 1 tablet (10 mg total) by mouth daily. 06/30/21   Oswald Hillock, MD  busPIRone (BUSPAR) 15 MG tablet Take 1 tablet (15 mg total) by mouth 2 (two) times daily. 11/15/20   Argentina Donovan, PA-C  carvedilol (COREG) 3.125 MG tablet Take 3 tablets (9.375 mg total) by mouth 2 (two) times daily with a meal. 09/30/21   Samella Parr, NP  citalopram (CELEXA) 10 MG tablet Take 1 tablet (10 mg total) by mouth daily. 04/09/21   Pokhrel, Corrie Mckusick, MD  diclofenac Sodium (VOLTAREN) 1 % GEL Apply 4 g topically 4 (four) times daily. 09/30/21   Samella Parr, NP  gabapentin (NEURONTIN) 100 MG capsule Take 1 capsule (100 mg total) by mouth 3 (three) times daily. 04/08/21   Pokhrel, Corrie Mckusick, MD  hydrOXYzine (ATARAX/VISTARIL) 10 MG tablet Take 1 tablet (10 mg total) by mouth 3 (three) times daily as needed. As needed for acute anxiety 11/15/20   Argentina Donovan, PA-C  Multiple Vitamin (MULTIVITAMIN WITH MINERALS) TABS tablet Place 1 tablet into feeding tube daily. Patient taking differently: Take 1 tablet by mouth daily. 04/09/21   Pokhrel, Corrie Mckusick, MD  omeprazole (PRILOSEC) 40 MG capsule Take 1 capsule (40 mg total) by mouth daily. 11/15/20   Argentina Donovan, PA-C  thiamine 100 MG tablet Take 1 tablet (100 mg total) by mouth daily. 07/01/21   Oswald Hillock, MD  traMADol (ULTRAM) 50 MG tablet Take 1 tablet (50 mg total) by mouth every 6 (six) hours as needed for moderate pain. 09/30/21   Samella Parr, NP  Vitamin D3 (VITAMIN D) 25 MCG tablet Take 1 tablet (1,000 Units total) by mouth daily. 09/30/21   Samella Parr, NP  ziprasidone (GEODON) 40 MG capsule Take 1 capsule (40 mg total) by mouth 2 (two) times  daily with a meal. 11/06/11 01/09/12  Daleen Bo, MD    Physical Exam: Vitals:   10/24/21 2308 10/24/21 2309 10/25/21 0030 10/25/21 0230  BP:  (!) 192/110 (!) 153/84 (!) 157/82  Pulse:  70 (!) 59 62  Resp:  '14 14 12  ' Temp: (!) 97.4 F (36.3 C)     TempSrc: Axillary     SpO2:  96% 97% 98%    Physical Exam Constitutional:      Appearance: She is not diaphoretic.  HENT:     Head: Normocephalic and atraumatic.     Mouth/Throat:     Comments: Very thick layer of dried blood around lips Eyes:     Extraocular Movements: Extraocular movements intact.     Conjunctiva/sclera: Conjunctivae normal.  Cardiovascular:     Rate and Rhythm: Normal rate and regular rhythm.     Pulses: Normal pulses.  Pulmonary:  Effort: Pulmonary effort is normal. No respiratory distress.     Breath sounds: Normal breath sounds. No wheezing or rales.  Abdominal:     General: Bowel sounds are normal. There is no distension.     Palpations: Abdomen is soft.     Tenderness: There is no abdominal tenderness.  Musculoskeletal:        General: No swelling or tenderness.     Cervical back: Normal range of motion and neck supple.  Skin:    General: Skin is warm and dry.  Neurological:     Comments: Very confused Somnolent but waking up intermittently, moaning and flailing her arms, tremulous Not answering any questions or following any commands No focal weakness, moving all extremities spontaneously     Labs on Admission: I have personally reviewed following labs and imaging studies  CBC: Recent Labs  Lab 10/25/21 0135  WBC 6.2  NEUTROABS 4.5  HGB 12.1  HCT 38.9  MCV 91.5  PLT 657*   Basic Metabolic Panel: Recent Labs  Lab 10/24/21 2350  NA 140  K 3.5  CL 109  CO2 22  GLUCOSE 84  BUN 19  CREATININE 1.36*  CALCIUM 9.2   GFR: CrCl cannot be calculated (Unknown ideal weight.). Liver Function Tests: Recent Labs  Lab 10/24/21 2350  AST 20  ALT 15  ALKPHOS 74  BILITOT 0.8  PROT  7.4  ALBUMIN 2.8*   No results for input(s): LIPASE, AMYLASE in the last 168 hours. No results for input(s): AMMONIA in the last 168 hours. Coagulation Profile: No results for input(s): INR, PROTIME in the last 168 hours. Cardiac Enzymes: No results for input(s): CKTOTAL, CKMB, CKMBINDEX, TROPONINI in the last 168 hours. BNP (last 3 results) No results for input(s): PROBNP in the last 8760 hours. HbA1C: No results for input(s): HGBA1C in the last 72 hours. CBG: No results for input(s): GLUCAP in the last 168 hours. Lipid Profile: No results for input(s): CHOL, HDL, LDLCALC, TRIG, CHOLHDL, LDLDIRECT in the last 72 hours. Thyroid Function Tests: No results for input(s): TSH, T4TOTAL, FREET4, T3FREE, THYROIDAB in the last 72 hours. Anemia Panel: No results for input(s): VITAMINB12, FOLATE, FERRITIN, TIBC, IRON, RETICCTPCT in the last 72 hours. Urine analysis:    Component Value Date/Time   COLORURINE YELLOW 10/25/2021 0145   APPEARANCEUR CLOUDY (A) 10/25/2021 0145   LABSPEC 1.011 10/25/2021 0145   PHURINE 7.0 10/25/2021 0145   GLUCOSEU NEGATIVE 10/25/2021 0145   HGBUR SMALL (A) 10/25/2021 0145   BILIRUBINUR NEGATIVE 10/25/2021 0145   KETONESUR 5 (A) 10/25/2021 0145   PROTEINUR 100 (A) 10/25/2021 0145   UROBILINOGEN 0.2 12/23/2017 1417   NITRITE NEGATIVE 10/25/2021 0145   LEUKOCYTESUR LARGE (A) 10/25/2021 0145    Radiological Exams on Admission: CT Head Wo Contrast  Result Date: 10/25/2021 CLINICAL DATA:  Mental status change, unknown cause. EXAM: CT HEAD WITHOUT CONTRAST TECHNIQUE: Contiguous axial images were obtained from the base of the skull through the vertex without intravenous contrast. COMPARISON:  09/05/2021. FINDINGS: Brain: No acute intracranial hemorrhage, midline shift or mass effect. No extra-axial fluid collection. Periventricular and subcortical white matter hypodensities are noted bilaterally. There is no hydrocephalus. There is a hypodensity in the basal  ganglia on the right, suggesting old lacunar infarct. Mild atrophy is noted. Vascular: Atherosclerotic calcification of the carotid siphons. No hyperdense vessel. Skull: Normal. Negative for fracture or focal lesion. Sinuses/Orbits: There is partial opacification of the mastoid air cells on the left. Mucosal thickening is noted in the maxillary  sinuses bilaterally and ethmoid air cells on the right. No acute orbital abnormality. Other: None. IMPRESSION: 1. No acute intracranial process. 2. 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 3. Bilateral maxillary and right ethmoid sinus disease. 4. Partial opacification of the mastoid air cells on the left, unchanged from the prior exam. Electronically Signed   By: Brett Fairy M.D.   On: 10/25/2021 01:29   DG Chest Port 1 View  Result Date: 10/24/2021 CLINICAL DATA:  Altered mental status. EXAM: PORTABLE CHEST 1 VIEW COMPARISON:  09/09/2021. FINDINGS: The heart size and mediastinal contours are within normal limits. No consolidation, effusion, or pneumothorax. No acute osseous abnormality. IMPRESSION: No acute cardiopulmonary process. Electronically Signed   By: Brett Fairy M.D.   On: 10/24/2021 23:30    EKG: Independently reviewed.  Sinus rhythm, mild ST depressions in inferior and lateral leads.  Previous tracing from 09/18/2021 showing ST depressions in lateral leads and also deep T wave inversions in lateral leads which are no longer present on the current EKG.  Assessment/Plan Principal Problem:   Acute encephalopathy Active Problems:   Substance abuse (HCC)   Depression   Thrombocytopenia (HCC)   Alcohol withdrawal ([place])   Acute toxic metabolic encephalopathy/ delirium  Likely multifactorial from alcohol withdrawal, polysubstance abuse, and UTI.  UDS positive for cocaine and THC.  CT head negative for acute intracranial process.  Moving all extremities spontaneously, no focal  weakness. -CIWA protocol for alcohol withdrawal.  Continue antibiotic for UTI.  Check TSH and ammonia levels.  Alcohol withdrawal Known history of alcohol abuse and frequent hospitalizations for withdrawal.  Likely causing encephalopathy and at risk for severe withdrawal during this hospitalization. -Admit to progressive care unit with CIWA protocol; Ativan as needed.  Thiamine, folate, and multivitamin.  Check mag and Phos levels.  UTI UA with evidence of pyuria and bacteriuria.  No fever, leukocytosis, or lactic acidosis to suggest sepsis. -Continue ceftriaxone.  Urine culture pending.  Concern for acute upper GI bleed No active hematemesis in the ED but patient noted to have a very thick layer of dried blood around her lips.  Hemoglobin is stable and she is currently hemodynamically stable.  She had a colonoscopy in 2018 but no prior EGD results in the chart.  No antiplatelet agents or anticoagulants listed in home medication list although pharmacy med rec currently pending.  Differentials for upper GI bleed include PUD, gastritis/esophagitis, Mallory-Weiss tear, and possible esophageal varices. -Type and screen, monitor H&H.  Start IV Protonix 40 mg every 12 hours.  Keep n.p.o. I have sent a message Dr. Paulita Fujita on-call for [facility] tonight requesting consultation in the morning.  Substance abuse -Will need counseling.  CKD stage IIIa Creatinine 1.3, stable.  Mild thrombocytopenia Likely due to alcohol use. -Continue to monitor  Chronic diastolic CHF No signs of volume overload. -Monitor volume status closely.  Hypertension Blood pressure elevated. -Avoid hypotension given concern for acute GI bleed.  IV hydralazine 5 mg every 4 hours prn SBP >200.  Anxiety, depression, bipolar disorder -Pharmacy med rec pending.  N.p.o. at this time given concern for acute upper GI bleed.  DVT prophylaxis: SCDs Code Status: Full code Family Communication: No family available at this  time. Disposition Plan: Status is: Inpatient  Remains inpatient appropriate because: Acute encephalopathy  Level of care: Level of care: Progressive  The medical decision making on this patient was of high complexity and the patient is at high  risk for clinical deterioration, therefore this is a level 3 visit.  Shela Leff MD [facility]  If 7PM-7AM, please contact night-coverage [email or web address]  10/25/2021, 3:12 AM

## 2021-10-25 NOTE — ED Notes (Signed)
Pt attempted to get out bed. NT and RN assisted pt back in bed.

## 2021-10-25 NOTE — ED Notes (Signed)
RN reattached pt back to cardiac monitoring.

## 2021-10-25 NOTE — ED Notes (Signed)
Pt is yelling intermittently while falling asleep. RN attempted to ask about contact people listed but pt does not respond. Pt rolls to side then starts yelling.

## 2021-10-25 NOTE — ED Notes (Signed)
Pt had unwitnessed fall. Two RN's assisted pt back in to bed. Pt was able to scoot to the top of bed when asked. Pt was yelling, "wan" and would not answer RN questions. Pt has a half inch abrasion on right forearm. No other injuries noted. RN notified Terrilee Croak MD and EDP to assess pt.

## 2021-10-25 NOTE — ED Notes (Signed)
Terrilee Croak MD approved pt to receive an additional 2 mg Ativan via secure chat.

## 2021-10-25 NOTE — ED Notes (Signed)
Pt attempted to get out of bed RN assisted pt back into bed. Pt is yelling and moving around in the bed. Pt will not keep cardiac monitor on. Pt IV coban wrapped.

## 2021-10-25 NOTE — ED Notes (Addendum)
Pt is in room screaming, "Mom" and "wan..wan.. wan." She is moving constantly in bed. RN asked for a contact person pt says, "I want my mommy."

## 2021-10-25 NOTE — ED Notes (Signed)
Patient is resting comfortably. 

## 2021-10-25 NOTE — ED Notes (Signed)
Upon entering the room pt is yelling and stating her legs and head hurt. Pt would not answer questions d/t screaming.

## 2021-10-25 NOTE — ED Notes (Signed)
Pt room door is open. Pt is screaming and stating she wants her mommy.

## 2021-10-25 NOTE — ED Notes (Signed)
Pt is just yelling not making sense of what she is saying.

## 2021-10-26 ENCOUNTER — Inpatient Hospital Stay (HOSPITAL_COMMUNITY): Payer: Medicaid Other

## 2021-10-26 DIAGNOSIS — G9340 Encephalopathy, unspecified: Secondary | ICD-10-CM | POA: Diagnosis not present

## 2021-10-26 LAB — CBC WITH DIFFERENTIAL/PLATELET
Abs Immature Granulocytes: 0.02 10*3/uL (ref 0.00–0.07)
Basophils Absolute: 0 10*3/uL (ref 0.0–0.1)
Basophils Relative: 0 %
Eosinophils Absolute: 0.1 10*3/uL (ref 0.0–0.5)
Eosinophils Relative: 3 %
HCT: 36.4 % (ref 36.0–46.0)
Hemoglobin: 11.5 g/dL — ABNORMAL LOW (ref 12.0–15.0)
Immature Granulocytes: 1 %
Lymphocytes Relative: 25 %
Lymphs Abs: 0.7 10*3/uL (ref 0.7–4.0)
MCH: 28.3 pg (ref 26.0–34.0)
MCHC: 31.6 g/dL (ref 30.0–36.0)
MCV: 89.7 fL (ref 80.0–100.0)
Monocytes Absolute: 0.3 10*3/uL (ref 0.1–1.0)
Monocytes Relative: 10 %
Neutro Abs: 1.7 10*3/uL (ref 1.7–7.7)
Neutrophils Relative %: 61 %
Platelets: 132 10*3/uL — ABNORMAL LOW (ref 150–400)
RBC: 4.06 MIL/uL (ref 3.87–5.11)
RDW: 16.3 % — ABNORMAL HIGH (ref 11.5–15.5)
WBC: 2.7 10*3/uL — ABNORMAL LOW (ref 4.0–10.5)
nRBC: 0 % (ref 0.0–0.2)

## 2021-10-26 LAB — BASIC METABOLIC PANEL
Anion gap: 11 (ref 5–15)
BUN: 12 mg/dL (ref 6–20)
CO2: 23 mmol/L (ref 22–32)
Calcium: 9 mg/dL (ref 8.9–10.3)
Chloride: 108 mmol/L (ref 98–111)
Creatinine, Ser: 1.05 mg/dL — ABNORMAL HIGH (ref 0.44–1.00)
GFR, Estimated: >60 mL/min (ref >60)
Glucose, Bld: 97 mg/dL (ref 70–99)
Potassium: 3.3 mmol/L — ABNORMAL LOW (ref 3.5–5.1)
Sodium: 142 mmol/L (ref 135–145)

## 2021-10-26 LAB — PROTIME-INR
INR: 1 (ref 0.8–1.2)
Prothrombin Time: 13 seconds (ref 11.4–15.2)

## 2021-10-26 LAB — AMMONIA: Ammonia: 26 umol/L (ref 9–35)

## 2021-10-26 MED ORDER — FOLIC ACID 1 MG PO TABS
1.0000 mg | ORAL_TABLET | Freq: Every day | ORAL | Status: DC
  Administered 2021-10-26 – 2021-11-15 (×21): 1 mg
  Filled 2021-10-26 (×20): qty 1

## 2021-10-26 MED ORDER — ADULT MULTIVITAMIN W/MINERALS CH
1.0000 | ORAL_TABLET | Freq: Every day | ORAL | Status: DC
  Administered 2021-10-26 – 2021-11-08 (×14): 1
  Filled 2021-10-26 (×13): qty 1

## 2021-10-26 MED ORDER — LORAZEPAM 2 MG/ML IJ SOLN
1.0000 mg | INTRAMUSCULAR | Status: AC | PRN
  Administered 2021-10-26 – 2021-10-28 (×10): 2 mg via INTRAVENOUS
  Filled 2021-10-26 (×11): qty 1

## 2021-10-26 MED ORDER — THIAMINE HCL 100 MG PO TABS
100.0000 mg | ORAL_TABLET | Freq: Every day | ORAL | Status: DC
  Administered 2021-10-26 – 2021-11-05 (×9): 100 mg
  Filled 2021-10-26 (×9): qty 1

## 2021-10-26 MED ORDER — AMLODIPINE BESYLATE 10 MG PO TABS
10.0000 mg | ORAL_TABLET | Freq: Every day | ORAL | Status: DC
  Administered 2021-10-26 – 2021-10-30 (×5): 10 mg
  Filled 2021-10-26 (×4): qty 1

## 2021-10-26 MED ORDER — CHLORDIAZEPOXIDE HCL 25 MG PO CAPS
50.0000 mg | ORAL_CAPSULE | Freq: Three times a day (TID) | ORAL | Status: DC
Start: 2021-10-26 — End: 2021-10-28
  Administered 2021-10-26 – 2021-10-28 (×7): 50 mg
  Filled 2021-10-26 (×6): qty 2

## 2021-10-26 MED ORDER — POTASSIUM CHLORIDE 10 MEQ/100ML IV SOLN
10.0000 meq | INTRAVENOUS | Status: AC
  Administered 2021-10-26 (×6): 10 meq via INTRAVENOUS
  Filled 2021-10-26 (×6): qty 100

## 2021-10-26 MED ORDER — ACETAMINOPHEN 325 MG PO TABS
650.0000 mg | ORAL_TABLET | Freq: Four times a day (QID) | ORAL | Status: DC | PRN
  Administered 2021-11-01 – 2021-11-15 (×6): 650 mg
  Filled 2021-10-26 (×6): qty 2

## 2021-10-26 MED ORDER — ACETAMINOPHEN 650 MG RE SUPP: 650.0000 mg | Freq: Four times a day (QID) | RECTAL | Status: DC | PRN

## 2021-10-26 MED ORDER — LORAZEPAM 1 MG PO TABS: 1.0000 mg | ORAL_TABLET | ORAL | Status: AC | PRN

## 2021-10-26 MED ORDER — THIAMINE HCL 100 MG/ML IJ SOLN
100.0000 mg | Freq: Every day | INTRAMUSCULAR | Status: DC
  Administered 2021-11-02 – 2021-11-03 (×2): 100 mg via INTRAVENOUS
  Filled 2021-10-26 (×3): qty 2

## 2021-10-26 NOTE — Consult Note (Signed)
NAME:  Sherry Baker, MRN:  [id number], DOB:  [birth year], LOS: 1 ADMISSION DATE:  10/24/2021, CONSULTATION DATE: 10/25/2021 REFERRING MD:  Terrilee Croak, MD , CHIEF COMPLAINT: Alcohol withdrawal  History of Present Illness:  Patient is encephalopathic so most of the history is taken from chart review.  55 year old female with alcohol dependence, alcoholic liver cirrhosis and polysubstance abuse was brought into the emergency department with altered mental status.  She was found to be agitated, screaming, work-up UDS positive for cocaine and cannabis, UA was positive for probable UTI.  She was admitted to hospitalist service for further evaluation but she started requiring more Ativan for delirium, remained encephalopathic, screaming.  PCCM was consulted for help with evaluation and management.  Pertinent  Medical History   Past Medical History:  Diagnosis Date   Alcohol abuse    Allergy    PCNS swelling   Arthritis    Bipolar 1 disorder ([place])    Cancer ([place]) 01/21/2017   rectal cancer   Cancer ([place])    Cirrhosis of liver ([place])    Depression    Genetic testing 03/24/2017   Ms. Mabin underwent genetic counseling and testing for hereditary cancer syndromes on 02/17/2017. Her results were negative for mutations in all 46 genes analyzed by [facility]'s 46-gene Common Hereditary Cancers Panel. Genes analyzed include: APC, ATM, AXIN2, BARD1, BMPR1A, BRCA1, BRCA2, BRIP1, CDH1, CDKN2A, CHEK2, CTNNA1, DICER1, EPCAM, GREM1, HOXB13, KIT, MEN1, MLH1, MSH2, MSH3, MSH6, MUTYH, NBN,   Hypertension      Significant Hospital Events: Including procedures, antibiotic start and stop dates in addition to other pertinent events   12/23 admitted, started on precedex infusion, librium taper, CIWA based ativan  Interim History / Subjective:   No acute issues. A little bit of agitation this morning requiring ativan.   Objective   Blood pressure (!) 173/93, pulse 73, temperature (!) 97 F (36.1 C),  temperature source Axillary, resp. rate (!) 23, height '5\' 2"'  (1.575 m), weight 45 kg, SpO2 96 %.        Intake/Output Summary (Last 24 hours) at 10/26/2021 0835 Last data filed at 10/26/2021 0600 Gross per 24 hour  Intake 3173.79 ml  Output 1800 ml  Net 1373.79 ml   Filed Weights   10/25/21 1200 10/25/21 1400  Weight: 45 kg 45 kg    Examination: General appearance: 55 y.o., female, sleeping/snoring Eyes: PERRL HENT: NCAT; dried blood on lips, poor dentition Lungs: rhonchorous bilaterally, no crackles, no wheeze, with normal respiratory effort CV: RRR, no murmur  Abdomen: Soft, non-tender; non-distended, BS present  Extremities: No peripheral edema, warm Skin: Normal turgor and texture; no rash Neuro: Drowsy but arousable with a nudge, no focal deficit    UCx with [facility] Problem list     Assessment & Plan:  Acute toxic-metabolic encephalopathy Moderate-severe alcohol withdrawal UTI (POA) CKD stage IIIa Alcoholic liver cirrhosis Chronic diastolic heart failure Uncontrolled hypertension Polysubstance abuse History of rectal cancer, status postchemotherapy now with colostomy bag  Check ammonia Continue CIWA Continue thiamine and folate Started on Librium 50 mg 3 times daily Wean Precedex infusion UA suggestive of UTI, continue IV ceftriaxone Follow-up urine culture Watch for signs of withdrawal from substances, her UDS is positive for cocaine and cannabis Continue routine colostomy care   Best Practice (right click and "Reselect all SmartList Selections" daily)   Diet/type: NPO w/ oral meds DVT prophylaxis: prophylactic heparin  GI prophylaxis: PPI Lines: N/A Foley:  N/A Code Status:  full code Last  date of multidisciplinary goals of care discussion [Will attempt to reach out to listed contacts today]  Critical care time: 35 minutes       Walker Shadow MD [facility] See Amion for pager If no response to pager,  please call [phone] until 7pm After 7pm, Please call E-link [phone]

## 2021-10-26 NOTE — Progress Notes (Signed)
K+3.3 ?Replaced per protocol  ?

## 2021-10-26 NOTE — Progress Notes (Signed)
Per CCM MD SBP< 180

## 2021-10-27 ENCOUNTER — Inpatient Hospital Stay (HOSPITAL_COMMUNITY): Payer: Medicaid Other

## 2021-10-27 DIAGNOSIS — Z85048 Personal history of other malignant neoplasm of rectum, rectosigmoid junction, and anus: Secondary | ICD-10-CM | POA: Diagnosis not present

## 2021-10-27 DIAGNOSIS — D696 Thrombocytopenia, unspecified: Secondary | ICD-10-CM | POA: Diagnosis not present

## 2021-10-27 DIAGNOSIS — F1020 Alcohol dependence, uncomplicated: Secondary | ICD-10-CM

## 2021-10-27 DIAGNOSIS — R410 Disorientation, unspecified: Secondary | ICD-10-CM | POA: Diagnosis not present

## 2021-10-27 DIAGNOSIS — G9340 Encephalopathy, unspecified: Secondary | ICD-10-CM | POA: Diagnosis not present

## 2021-10-27 LAB — URINE CULTURE: Culture: >=100000 — AB

## 2021-10-27 LAB — CBC
HCT: 36.1 % (ref 36.0–46.0)
Hemoglobin: 11.8 g/dL — ABNORMAL LOW (ref 12.0–15.0)
MCH: 28.6 pg (ref 26.0–34.0)
MCHC: 32.7 g/dL (ref 30.0–36.0)
MCV: 87.6 fL (ref 80.0–100.0)
Platelets: 152 10*3/uL (ref 150–400)
RBC: 4.12 MIL/uL (ref 3.87–5.11)
RDW: 16.4 % — ABNORMAL HIGH (ref 11.5–15.5)
WBC: 2.7 10*3/uL — ABNORMAL LOW (ref 4.0–10.5)
nRBC: 0 % (ref 0.0–0.2)

## 2021-10-27 LAB — GLUCOSE, CAPILLARY: Glucose-Capillary: 89 mg/dL (ref 70–99)

## 2021-10-27 LAB — BASIC METABOLIC PANEL
Anion gap: 13 (ref 5–15)
BUN: 8 mg/dL (ref 6–20)
CO2: 22 mmol/L (ref 22–32)
Calcium: 8.9 mg/dL (ref 8.9–10.3)
Chloride: 103 mmol/L (ref 98–111)
Creatinine, Ser: 1.25 mg/dL — ABNORMAL HIGH (ref 0.44–1.00)
GFR, Estimated: 52 mL/min — ABNORMAL LOW (ref >60)
Glucose, Bld: 73 mg/dL (ref 70–99)
Potassium: 3.5 mmol/L (ref 3.5–5.1)
Sodium: 138 mmol/L (ref 135–145)

## 2021-10-27 MED ORDER — LACTULOSE 10 GM/15ML PO SOLN
30.0000 g | Freq: Two times a day (BID) | ORAL | Status: DC
  Administered 2021-10-27: 17:00:00 30 g via ORAL
  Filled 2021-10-27: qty 45

## 2021-10-27 MED ORDER — LACTULOSE 10 GM/15ML PO SOLN
30.0000 g | Freq: Two times a day (BID) | ORAL | Status: DC
Start: 2021-10-27 — End: 2021-10-29
  Administered 2021-10-27 – 2021-10-28 (×3): 30 g
  Filled 2021-10-27 (×3): qty 45

## 2021-10-27 MED ORDER — SODIUM CHLORIDE 0.9 % IV SOLN: INTRAVENOUS | Status: DC | PRN

## 2021-10-27 MED ORDER — SODIUM CHLORIDE 0.9 % IV SOLN
2.0000 g | INTRAVENOUS | Status: AC
  Administered 2021-10-28 – 2021-10-29 (×2): 2 g via INTRAVENOUS
  Filled 2021-10-27 (×2): qty 20

## 2021-10-27 NOTE — Progress Notes (Signed)
[facility] Progress Note Patient Name: Sherry Baker DOB: 11-06-1965 MRN: [id number]   Date of Service  10/27/2021  HPI/Events of Note  Received request for renewal of restraints. Bilateral wrist and soft waist belt. Patient seen confused and restless and is at risk for self harm by pulling lines and tubes and falls.  eICU Interventions  Bilateral soft wrist restraints renewed Bedside team to assess in am if restraints to be continued      Intervention Category Minor Interventions: Agitation / anxiety - evaluation and management  Judd Lien 10/27/2021, 10:26 PM

## 2021-10-27 NOTE — Progress Notes (Signed)
NAME:  ALMETER WESTHOFF, MRN:  [id number], DOB:  Jun 28, 1966, LOS: 2 ADMISSION DATE:  10/24/2021, CONSULTATION DATE: 10/25/2021 REFERRING MD:  Terrilee Croak, MD , CHIEF COMPLAINT: Alcohol withdrawal  History of Present Illness:  Patient is encephalopathic so most of the history is taken from chart review.  55 year old female with alcohol dependence, alcoholic liver cirrhosis and polysubstance abuse was brought into the emergency department with altered mental status.  She was found to be agitated, screaming, work-up UDS positive for cocaine and cannabis, UA was positive for probable UTI.  She was admitted to hospitalist service for further evaluation but she started requiring more Ativan for delirium, remained encephalopathic, screaming.  PCCM was consulted for help with evaluation and management.  Pertinent  Medical History   Past Medical History:  Diagnosis Date   Alcohol abuse    Allergy    PCNS swelling   Arthritis    Bipolar 1 disorder ([place])    Cancer ([place]) 01/21/2017   rectal cancer   Cancer ([place])    Cirrhosis of liver ([place])    Depression    Genetic testing 03/24/2017   Ms. Colantonio underwent genetic counseling and testing for hereditary cancer syndromes on 02/17/2017. Her results were negative for mutations in all 46 genes analyzed by [facility]'s 46-gene Common Hereditary Cancers Panel. Genes analyzed include: APC, ATM, AXIN2, BARD1, BMPR1A, BRCA1, BRCA2, BRIP1, CDH1, CDKN2A, CHEK2, CTNNA1, DICER1, EPCAM, GREM1, HOXB13, KIT, MEN1, MLH1, MSH2, MSH3, MSH6, MUTYH, NBN,   Hypertension      Significant Hospital Events: Including procedures, antibiotic start and stop dates in addition to other pertinent events   12/23 admitted, started on precedex infusion, librium taper, CIWA based ativan  Interim History / Subjective:   No acute issues overnight. Intermittent agitation, has required several pushes of ativan today.  Objective   Blood pressure (!) 179/87, pulse 66, temperature (!)  96.5 F (35.8 C), temperature source Axillary, resp. rate 20, height _0  (1.575 m), weight 45 kg, SpO2 94 %.        Intake/Output Summary (Last 24 hours) at 10/27/2021 1016 Last data filed at 10/27/2021 0800 Gross per 24 hour  Intake 821.99 ml  Output 700 ml  Net 121.99 ml   Filed Weights   10/25/21 1200 10/25/21 1400  Weight: 45 kg 45 kg    Examination: General appearance: 55 y.o., female, sleeping/snoring Eyes: PERRL HENT: NCAT; dried blood on lips, poor dentition Lungs: rhonchorous bilaterally, no crackles, no wheeze, with normal respiratory effort CV: RRR, no murmur  Abdomen: Soft, non-tender; non-distended, BS present  Extremities: No peripheral edema, warm Skin: Normal turgor and texture; no rash Neuro: Drowsy but arousable with a nudge, no focal deficit    UCx with Proteus pansensitive  Resolved Hospital Problem list     Assessment & Plan:  Acute toxic-metabolic encephalopathy Moderate-severe alcohol withdrawal Proteus UTI (POA) CKD stage IIIa Alcoholic liver cirrhosis Chronic diastolic heart failure Uncontrolled hypertension Polysubstance abuse History of rectal cancer, status postchemotherapy now with colostomy bag  Continue CIWA Continue thiamine and folate Librium 50 mg 3 times daily start taper tomorrow Wean Precedex infusion UA suggestive of UTI, continue IV ceftriaxone for 5 day course Watch for signs of withdrawal from substances, her UDS is positive for cocaine and cannabis Continue routine colostomy care   Best Practice (right click and "Reselect all SmartList Selections" daily)   Diet/type: NPO w/ meds via tube DVT prophylaxis: prophylactic heparin  GI prophylaxis: PPI Lines: N/A Foley:  N/A Code Status:  full  code Last date of multidisciplinary goals of care discussion [Will attempt to reach out to listed contacts today]  Critical care time: 34 minutes       Walker Shadow MD [facility] See Amion for  pager If no response to pager, please call [phone] until 7pm After 7pm, Please call E-link [phone]

## 2021-10-27 NOTE — Progress Notes (Addendum)
Gastroenterology Inpatient Follow-up Note   PATIENT IDENTIFICATION  Sherry Baker is a 55 y.o. female who was admitted with AMS, with history of prior APR for rectal cancer and has not had surveillance but also has evidence of anemia. Hospital Day: 4  SUBJECTIVE  The patient remains with intermittent agitation.  Per notation in the chart she is required Ativan, Librium, Precedex infusion. Unfortunately the patient is not able to be a part of any conversation today.   OBJECTIVE  Scheduled Inpatient Medications:   amLODipine  10 mg Per Tube Daily   chlordiazePOXIDE  50 mg Per Tube TID   Chlorhexidine Gluconate Cloth  6 each Topical Daily   cloNIDine  0.2 mg Transdermal Weekly   folic acid  1 mg Per Tube Daily   heparin  5,000 Units Subcutaneous Q8H   lactulose  30 g Per Tube BID   mouth rinse  15 mL Mouth Rinse BID   multivitamin with minerals  1 tablet Per Tube Daily   mupirocin ointment  1 application Nasal BID   pantoprazole (PROTONIX) IV  40 mg Intravenous Q12H   thiamine  100 mg Per Tube Daily   Or   thiamine  100 mg Intravenous Daily   Continuous Inpatient Infusions:   sodium chloride Stopped (10/27/21 0541)   [START ON 10/28/2021] cefTRIAXone (ROCEPHIN)  IV     dexmedetomidine (PRECEDEX) IV infusion 0.9 mcg/kg/hr (10/27/21 2200)   PRN Inpatient Medications: sodium chloride, acetaminophen **OR** acetaminophen, hydrALAZINE, labetalol, LORazepam **OR** LORazepam   Physical Examination  Temp:  [94 F (34.4 C)-99.9 F (37.7 C)] 98.3 F (36.8 C) (12/25 2000) Pulse Rate:  [63-103] 73 (12/25 2200) Resp:  [18-27] 22 (12/25 2200) BP: (134-182)/(72-151) 147/83 (12/25 2200) SpO2:  [92 %-98 %] 94 % (12/25 2200) Temp (24hrs), Avg:97.3 F (36.3 C), Min:94 F (34.4 C), Max:99.9 F (37.7 C)  Weight: 45 kg GEN: Appears chronically ill PSYCH: Noncooperative EYE: Conjunctivae pale-pink ENT: Dry mucous membranes CV: Nontachycardic RESP: Decreased breath sounds at the bases  bilaterally GI: NABS, soft, protuberant abdomen, surgical scars present, patient does not grimace upon deep palpation  MSK/EXT: No lower extremity edema SKIN: No jaundice NEURO: No clonus noted   Review of Data   Laboratory Studies   Recent Labs  Lab 10/25/21 0356 10/26/21 0410 10/27/21 1501  NA  --    < > 138  K  --    < > 3.5  CL  --    < > 103  CO2  --    < > 22  BUN  --    < > 8  CREATININE  --    < > 1.25*  GLUCOSE  --    < > 73  CALCIUM  --    < > 8.9  MG 2.2  --   --   PHOS 3.7  --   --    < > = values in this interval not displayed.   Recent Labs  Lab 10/24/21 2350  AST 20  ALT 15  ALKPHOS 74    Recent Labs  Lab 10/25/21 0356 10/26/21 0410 10/27/21 1501  WBC 6.4 2.7* 2.7*  HGB 12.0 11.5* 11.8*  HCT 39.1 36.4 36.1  PLT 154 132* 152   Recent Labs  Lab 10/26/21 0410  INR 1.0    Imaging Studies  US Abdomen Complete  Result Date: 10/27/2021 CLINICAL DATA:  Alcohol use disorder severe with dependence, altered mental status, thrombocytopenia, history of rectal cancer, cirrhosis EXAM: ABDOMEN  ULTRASOUND COMPLETE COMPARISON:  CT abdomen and pelvis 12/18/2018 FINDINGS: Gallbladder: Debris/sludge within gallbladder. Mild gallbladder wall thickening. Tiny dependent shadowing calculi up to 6 mm. No pericholecystic fluid or sonographic Murphy sign. Common bile duct: Diameter: 5 mm, normal Liver: Normal echogenicity without mass or nodularity. Portal vein is patent on color Doppler imaging with normal direction of blood flow towards the liver. IVC: Normal appearance Pancreas: Normal appearance Spleen: Normal appearance, 8.8 cm length Right Kidney: Length: 8.6 cm. Cortical thinning. Increased cortical echogenicity. Lobulated margins. Small cyst at inferior pole 1.8 cm diameter. No definite mass or hydronephrosis. Left Kidney: Length: 11.8 cm. Normal cortical thickness. Upper normal cortical echogenicity with lobulated cortical contour. No mass, hydronephrosis, or  shadowing calcification. Abdominal aorta: Normal caliber Other findings: No free fluid IMPRESSION: Small amount of sludge and tiny calculi within gallbladder with mild gallbladder wall thickening, but no pericholecystic fluid or sonographic Murphy sign. Atrophic scarred RIGHT kidney with small cyst. Remainder of exam unremarkable. Electronically Signed   By: Lavonia Dana M.D.   On: 10/27/2021 16:50   DG Abd Portable 1V  Result Date: 10/26/2021 CLINICAL DATA:  NG tube placement EXAM: PORTABLE ABDOMEN - 1 VIEW COMPARISON:  09/13/2021 FINDINGS: NG tube tip is in the distal stomach. Nonobstructive bowel gas pattern. IMPRESSION: NG tube tip in the distal stomach. Electronically Signed   By: Rolm Baptise M.D.   On: 10/26/2021 09:59    GI Procedures and Studies  No new relevant studies to review   ASSESSMENT  Sherry Baker is a 55 y.o. female with a pmh significant for alcohol use disorder, previous alcohol intoxication with withdrawals, substance use disorder, previous rectal cancer status post APR.  Patient presented with altered mental status and encephalopathy.  The patient remains unable to provide any clinical history.  Unfortunately we cannot pursue an endoscopy or colonoscopy through ostomy unless the patient is awake enough to consider this.  I think she would get the best benefit to get her up-to-date in regards to surveillance of her previous rectal cancer if we did offer this but is not clear when the patient will be awake enough or if she would even agree to this.  With her blood counts as they are in no overt GI bleeding, we cannot pursue EGD/colonoscopy at this time through family alternates.  At this point I would recommend consideration of lactulose therapy even though she has no evidence of overt cirrhosis she does have a pretty significant history of alcohol use disorder.  Also will recommend an abdominal ultrasound to be pursued so that he can look at the spleen and look at the liver as she  has not had imaging in quite a while.     PLAN/RECOMMENDATIONS  Abdominal ultrasound ordered Initiate lactulose once to twice daily to see if that may be of help We will follow these up tomorrow She will remain on the GI list at least through tomorrow hopefully with improved mentation - If no improvement in mentation then once she has improved the medical service can ask GI to reevaluate and offer her procedures --For now EGD/colonoscopy are on hold but would be considered if she desires  Maintain PPI at least once daily   Please page/call with questions or concerns.   Justice Britain, MD [facility] Office # [phone]    LOS: 2 days  Irving Copas  10/27/2021, 10:48 PM   I have reviewed the patient's abdominal ultrasound which shows no evidence of splenomegaly or significant liver  echotexture changes.  As such it is less likely that she has underlying cirrhosis.  Thus the use of lactulose is unlikely to help the patient but certainly cannot hurt her either.  The inpatient GI service will sign off at this time.  As I noted above, when the patient is more awake and alert and able to be a part of the conversation, please reach out to the inpatient GI service and discuss with [place] the possibility of pursuing endoscopic evaluation if she desires, while she is inpatient since it has been difficult to have her follow-up as an outpatient.   Justice Britain, MD [facility] Office # [phone]

## 2021-10-28 DIAGNOSIS — G9340 Encephalopathy, unspecified: Secondary | ICD-10-CM | POA: Diagnosis not present

## 2021-10-28 LAB — BASIC METABOLIC PANEL
Anion gap: 15 (ref 5–15)
BUN: 9 mg/dL (ref 6–20)
CO2: 19 mmol/L — ABNORMAL LOW (ref 22–32)
Calcium: 8.8 mg/dL — ABNORMAL LOW (ref 8.9–10.3)
Chloride: 106 mmol/L (ref 98–111)
Creatinine, Ser: 1.24 mg/dL — ABNORMAL HIGH (ref 0.44–1.00)
GFR, Estimated: 52 mL/min — ABNORMAL LOW (ref >60)
Glucose, Bld: 90 mg/dL (ref 70–99)
Potassium: 3.6 mmol/L (ref 3.5–5.1)
Sodium: 140 mmol/L (ref 135–145)

## 2021-10-28 LAB — CBC
HCT: 38.5 % (ref 36.0–46.0)
Hemoglobin: 11.6 g/dL — ABNORMAL LOW (ref 12.0–15.0)
MCH: 27.9 pg (ref 26.0–34.0)
MCHC: 30.1 g/dL (ref 30.0–36.0)
MCV: 92.5 fL (ref 80.0–100.0)
Platelets: 133 10*3/uL — ABNORMAL LOW (ref 150–400)
RBC: 4.16 MIL/uL (ref 3.87–5.11)
RDW: 17 % — ABNORMAL HIGH (ref 11.5–15.5)
WBC: 2.8 10*3/uL — ABNORMAL LOW (ref 4.0–10.5)
nRBC: 0 % (ref 0.0–0.2)

## 2021-10-28 LAB — GLUCOSE, CAPILLARY
Glucose-Capillary: 103 mg/dL — ABNORMAL HIGH (ref 70–99)
Glucose-Capillary: 98 mg/dL (ref 70–99)
Glucose-Capillary: 100 mg/dL — ABNORMAL HIGH (ref 70–99)
Glucose-Capillary: 117 mg/dL — ABNORMAL HIGH (ref 70–99)

## 2021-10-28 LAB — PHOSPHORUS
Phosphorus: 4.8 mg/dL — ABNORMAL HIGH (ref 2.5–4.6)
Phosphorus: 4.8 mg/dL — ABNORMAL HIGH (ref 2.5–4.6)

## 2021-10-28 LAB — MAGNESIUM
Magnesium: 1.9 mg/dL (ref 1.7–2.4)
Magnesium: 1.9 mg/dL (ref 1.7–2.4)

## 2021-10-28 MED ORDER — PROSOURCE TF PO LIQD
45.0000 mL | Freq: Two times a day (BID) | ORAL | Status: DC
  Administered 2021-10-28 – 2021-10-29 (×3): 45 mL
  Filled 2021-10-28 (×3): qty 45

## 2021-10-28 MED ORDER — CHLORDIAZEPOXIDE HCL 25 MG PO CAPS
25.0000 mg | ORAL_CAPSULE | Freq: Three times a day (TID) | ORAL | Status: DC
Start: 2021-10-28 — End: 2021-11-04
  Administered 2021-10-28 – 2021-11-04 (×21): 25 mg
  Filled 2021-10-28 (×22): qty 1

## 2021-10-28 MED ORDER — PANTOPRAZOLE 2 MG/ML SUSPENSION
40.0000 mg | Freq: Two times a day (BID) | ORAL | Status: DC
Start: 2021-10-28 — End: 2021-11-15
  Administered 2021-10-28 – 2021-11-15 (×36): 40 mg
  Filled 2021-10-28 (×36): qty 20

## 2021-10-28 MED ORDER — VITAL HIGH PROTEIN PO LIQD
1000.0000 mL | ORAL | Status: DC
  Administered 2021-10-28: 12:00:00 1000 mL

## 2021-10-28 MED ORDER — HALOPERIDOL LACTATE 5 MG/ML IJ SOLN
2.0000 mg | Freq: Four times a day (QID) | INTRAMUSCULAR | Status: AC | PRN
  Administered 2021-10-29 – 2021-11-01 (×2): 5 mg via INTRAVENOUS
  Filled 2021-10-28 (×2): qty 1

## 2021-10-28 MED ORDER — LORAZEPAM 2 MG/ML IJ SOLN
1.0000 mg | INTRAMUSCULAR | Status: AC | PRN
Start: 2021-10-28 — End: 2021-10-30
  Administered 2021-10-28 – 2021-10-29 (×4): 2 mg via INTRAVENOUS
  Filled 2021-10-28 (×2): qty 1
  Filled 2021-10-28: qty 2
  Filled 2021-10-28: qty 1

## 2021-10-28 MED ORDER — LORAZEPAM 1 MG PO TABS
1.0000 mg | ORAL_TABLET | ORAL | Status: AC | PRN
Start: 2021-10-28 — End: 2021-10-30
  Administered 2021-10-29: 06:00:00 2 mg
  Filled 2021-10-28: qty 2

## 2021-10-28 NOTE — Consult Note (Signed)
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 Anal opening closed due to previous rectal cancer.  Pressure Injury POA: Yes Measurement: 2.5 x 1.5 x 0.4 Wound bed: Pink to the bone  Drainage (amount, consistency, odor)  Periwound: red/pink Dressing procedure/placement/frequency: Apply a cut to fit piece of Aquacel Advantage Kellie Simmering # [phone]) over the coccyx wound and secure with sacral foam dressing. Change Aquacel daily.   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  Stoma type/location: LLQ end colostomy Stomal assessment/size: 1" pink moist budded Peristomal assessment: deferred Treatment options for stomal/peristomal skin: barrier ring if needed Output: brown mushy Ostomy pouching: 1pc. 2 1/4" ([place] # 90) Barrier ring [phone]) Education provided: None  Monitor the wound area(s) for worsening of condition such as: Signs/symptoms of infection, increase in size, development of or worsening of odor, development of pain, or increased pain at the affected locations.   Notify the medical team if any of these develop.  Thank you for the consult. [facility] nurse will not follow at this time.   Please re-consult the [facility] team if needed.  Cathlean Marseilles Tamala Julian, MSN, RN, [place], [facility] Pearl, [facility] Associate Pager [phone]

## 2021-10-28 NOTE — Progress Notes (Signed)
PHARMACIST - PHYSICIAN COMMUNICATION  DR:   Verlee Monte   CONCERNING: IV to Oral Route Change Policy  RECOMMENDATION: This patient is receiving Protonix by the intravenous route.  Based on criteria approved by the Pharmacy and Therapeutics Committee, the intravenous medication(s) is/are being converted to the equivalent oral dose form(s).   DESCRIPTION: These criteria include: The patient is eating (either orally or via tube) and/or has been taking other orally administered medications for a least 24 hours The patient has no evidence of active gastrointestinal bleeding or impaired GI absorption (gastrectomy, short bowel, patient on TNA or NPO).  If you have questions about this conversion, please contact the Pharmacy Department  []   [phone] )  [facility] []   [phone] )  [facility] [x]   [phone] )  [facility] []   [phone] )  [facility] []   [phone] )  [facility]

## 2021-10-28 NOTE — Progress Notes (Signed)
NAME:  LIZZETT NOBILE, MRN:  [id number], DOB:  1966/04/25, LOS: 3 ADMISSION DATE:  10/24/2021, CONSULTATION DATE: 10/25/2021 REFERRING MD:  Terrilee Croak, MD , CHIEF COMPLAINT: Alcohol withdrawal  History of Present Illness:  Patient is encephalopathic so most of the history is taken from chart review.  55 year old female with alcohol dependence, alcoholic liver cirrhosis and polysubstance abuse was brought into the emergency department with altered mental status.  She was found to be agitated, screaming, work-up UDS positive for cocaine and cannabis, UA was positive for probable UTI.  She was admitted to hospitalist service for further evaluation but she started requiring more Ativan for delirium, remained encephalopathic, screaming.  PCCM was consulted for help with evaluation and management.  Pertinent  Medical History   Past Medical History:  Diagnosis Date   Alcohol abuse    Allergy    PCNS swelling   Arthritis    Bipolar 1 disorder ([place])    Cancer ([place]) 01/21/2017   rectal cancer   Cancer ([place])    Cirrhosis of liver ([place])    Depression    Genetic testing 03/24/2017   Ms. Alberta underwent genetic counseling and testing for hereditary cancer syndromes on 02/17/2017. Her results were negative for mutations in all 46 genes analyzed by [facility]'s 46-gene Common Hereditary Cancers Panel. Genes analyzed include: APC, ATM, AXIN2, BARD1, BMPR1A, BRCA1, BRCA2, BRIP1, CDH1, CDKN2A, CHEK2, CTNNA1, DICER1, EPCAM, GREM1, HOXB13, KIT, MEN1, MLH1, MSH2, MSH3, MSH6, MUTYH, NBN,   Hypertension      Significant Hospital Events: Including procedures, antibiotic start and stop dates in addition to other pertinent events   12/23 admitted, started on precedex infusion, librium taper, CIWA based ativan  Interim History / Subjective:   No acute issues over last 24h. Intermittently agitated but has not required ativan since last night.  Objective   Blood pressure 128/81, pulse (!) 58, temperature  97.6 F (36.4 C), temperature source Oral, resp. rate 18, height '5\' 2"'  (1.575 m), weight 45 kg, SpO2 96 %.        Intake/Output Summary (Last 24 hours) at 10/28/2021 1434 Last data filed at 10/28/2021 1300 Gross per 24 hour  Intake 468.32 ml  Output 1010 ml  Net -541.68 ml   Filed Weights   10/25/21 1200 10/25/21 1400  Weight: 45 kg 45 kg    Examination: General appearance: 55 y.o., female, sleeping/snoring, intermittently agitated Eyes: PERRL HENT: NCAT; poor dentition Lungs: less rhonchorous bilaterally, no crackles, no wheeze, with normal respiratory effort CV: RRR, no murmur  Abdomen: Soft, non-tender; non-distended, BS present  Extremities: No peripheral edema, warm Neuro: Drowsy but arousable with a nudge, no focal deficit   S Cr 1.24  Resolved Hospital Problem list     Assessment & Plan:  Acute toxic-metabolic encephalopathy Moderate-severe alcohol withdrawal Proteus UTI (POA) CKD stage IIIa Alcoholic liver cirrhosis Chronic diastolic heart failure Uncontrolled hypertension Polysubstance abuse History of rectal cancer, status postchemotherapy now with colostomy bag  Continue CIWA Decrease librium to 25 TID Wean Precedex infusion more aggressively today Can use haldol if absolutely needed for staff safety UA suggestive of UTI, continue IV ceftriaxone for 5 day course Watch for signs of withdrawal from substances, her UDS is positive for cocaine and cannabis Continue routine colostomy care Start tube feeds Thiamine   Best Practice (right click and "Reselect all SmartList Selections" daily)   Diet/type: tubefeeds DVT prophylaxis: prophylactic heparin  GI prophylaxis: PPI Lines: N/A Foley:  N/A Code Status:  full code Last date of  multidisciplinary goals of care discussion [name] is next of kin [phone]. She had MPOA paperwork started last admission but she's unsure if this was fully completed. I updated her today. Confirmed full code  although she says that she wouldn't want to be maintained on life support indefinitely.]  Critical care time: 30 minutes       Walker Shadow MD [facility] See Amion for pager If no response to pager, please call [phone] until 7pm After 7pm, Please call E-link [phone]

## 2021-10-29 DIAGNOSIS — G9340 Encephalopathy, unspecified: Secondary | ICD-10-CM | POA: Diagnosis not present

## 2021-10-29 LAB — MAGNESIUM
Magnesium: 1.9 mg/dL (ref 1.7–2.4)
Magnesium: 2.2 mg/dL (ref 1.7–2.4)

## 2021-10-29 LAB — CBC
HCT: 35.3 % — ABNORMAL LOW (ref 36.0–46.0)
Hemoglobin: 11.5 g/dL — ABNORMAL LOW (ref 12.0–15.0)
MCH: 28.9 pg (ref 26.0–34.0)
MCHC: 32.6 g/dL (ref 30.0–36.0)
MCV: 88.7 fL (ref 80.0–100.0)
Platelets: 155 10*3/uL (ref 150–400)
RBC: 3.98 MIL/uL (ref 3.87–5.11)
RDW: 16.4 % — ABNORMAL HIGH (ref 11.5–15.5)
WBC: 4.2 10*3/uL (ref 4.0–10.5)
nRBC: 0 % (ref 0.0–0.2)

## 2021-10-29 LAB — GLUCOSE, CAPILLARY
Glucose-Capillary: 111 mg/dL — ABNORMAL HIGH (ref 70–99)
Glucose-Capillary: 112 mg/dL — ABNORMAL HIGH (ref 70–99)
Glucose-Capillary: 117 mg/dL — ABNORMAL HIGH (ref 70–99)
Glucose-Capillary: 119 mg/dL — ABNORMAL HIGH (ref 70–99)
Glucose-Capillary: 123 mg/dL — ABNORMAL HIGH (ref 70–99)
Glucose-Capillary: 134 mg/dL — ABNORMAL HIGH (ref 70–99)

## 2021-10-29 LAB — BASIC METABOLIC PANEL
Anion gap: 12 (ref 5–15)
BUN: 14 mg/dL (ref 6–20)
CO2: 19 mmol/L — ABNORMAL LOW (ref 22–32)
Calcium: 8.9 mg/dL (ref 8.9–10.3)
Chloride: 111 mmol/L (ref 98–111)
Creatinine, Ser: 1.31 mg/dL — ABNORMAL HIGH (ref 0.44–1.00)
GFR, Estimated: 49 mL/min — ABNORMAL LOW (ref >60)
Glucose, Bld: 121 mg/dL — ABNORMAL HIGH (ref 70–99)
Potassium: 2.8 mmol/L — ABNORMAL LOW (ref 3.5–5.1)
Sodium: 142 mmol/L (ref 135–145)

## 2021-10-29 LAB — PHOSPHORUS
Phosphorus: 4.4 mg/dL (ref 2.5–4.6)
Phosphorus: 4 mg/dL (ref 2.5–4.6)

## 2021-10-29 MED ORDER — FREE WATER
150.0000 mL | Status: DC
  Administered 2021-10-29 – 2021-11-10 (×71): 150 mL

## 2021-10-29 MED ORDER — MAGNESIUM SULFATE IN D5W 1-5 GM/100ML-% IV SOLN
1.0000 g | Freq: Once | INTRAVENOUS | Status: AC
  Administered 2021-10-29: 10:00:00 1 g via INTRAVENOUS
  Filled 2021-10-29: qty 100

## 2021-10-29 MED ORDER — OSMOLITE 1.5 CAL PO LIQD
1000.0000 mL | ORAL | Status: DC
Start: 2021-10-29 — End: 2021-11-14
  Administered 2021-10-29 – 2021-11-13 (×16): 1000 mL
  Filled 2021-10-29 (×12): qty 1000

## 2021-10-29 MED ORDER — LACTATED RINGERS IV SOLN: INTRAVENOUS | Status: DC

## 2021-10-29 MED ORDER — POTASSIUM CHLORIDE 20 MEQ PO PACK
40.0000 meq | PACK | ORAL | Status: AC
  Administered 2021-10-29 (×2): 40 meq
  Filled 2021-10-29 (×2): qty 2

## 2021-10-29 MED ORDER — LACTULOSE 10 GM/15ML PO SOLN
10.0000 g | Freq: Two times a day (BID) | ORAL | Status: DC
  Administered 2021-10-29 – 2021-11-15 (×36): 10 g
  Filled 2021-10-29 (×36): qty 15

## 2021-10-29 NOTE — Progress Notes (Addendum)
Initial Nutrition Assessment  DOCUMENTATION CODES:  Underweight  INTERVENTION:  Initiate tube feeding via NGT: Osmolite 1.5 at 25 ml/h and increase by 10 every 12 hrs to goal rate of 55 ml/h (1320 ml per day)  Provides 1980 kcal, 83 gm protein, 1003 ml free water daily.  Free water flushes of 150 ml every 4 hours (total of 1903 ml of fluid daily).  Monitor magnesium and phosphorus every 12 hours x 4 occurrences, MD to replete as needed, as pt is at risk for refeeding syndrome given suspected severe malnutrition.   Continue CIWA protocol.  NUTRITION DIAGNOSIS:  Inadequate oral intake related to inability to eat as evidenced by NPO status.  GOAL:  Patient will meet greater than or equal to 90% of their needs  MONITOR:  TF tolerance, Weight trends, I & O's, Labs, Diet advancement  REASON FOR ASSESSMENT:  Consult Enteral/tube feeding initiation and management  ASSESSMENT:  55 yo female with a PMH of alcohol dependence, alcoholic liver cirrhosis, rectal cancer s/p colostomy, and polysubstance abuse who presents with altered mental status. She was found to be agitated, screaming, work-up UDS positive for cocaine and cannabis, UA was positive for probable UTI. 12/24 - NGT placed  RD working remotely.  Pt very likely malnourished, but unable to officially diagnose at this time.  Pt is also a likely refeeding risk.  Per Epic, pt has lost ~13 lbs (12%) in the last almost 7 months, which is significant and severe for the time frame.  Medications: reviewed; lactulose, MVI with minerals, folic acid, Protonix BID, Klor-Con 40 mEq, thiamine, Precedex, LR @ 75 ml/hr, Haldol PRN (given once today), Ativan PRN (given twice today)  Labs: reviewed; K 2.8 (L), CBG 98-119  NUTRITION - FOCUSED PHYSICAL EXAM: Unable to perform - defer to follow-up  Diet Order:   Diet Order             Diet NPO time specified Except for: Sips with Meds  Diet effective now                   EDUCATION NEEDS:  Not appropriate for education at this time  Skin:  Skin Assessment: Skin Integrity Issues: Skin Integrity Issues:: Stage III Stage III: Sacrum  Last BM:  10/28/21- colostomy  Height:  Ht Readings from Last 1 Encounters:  10/25/21 5\' 2"  (1.575 m)   Weight:  Wt Readings from Last 1 Encounters:  10/29/21 43.3 kg   BMI:  Body mass index is 17.46 kg/m.  Estimated Nutritional Needs:  Kcal:  1800-2000 Protein:  70-85 grams Fluid:  >1.8 L  Derrel Nip, RD, LDN (she/her/hers) Clinical Inpatient Dietitian RD Pager/After-Hours/Weekend Pager # in [place]

## 2021-10-29 NOTE — Progress Notes (Signed)
Please be advised that Rocephin order is complete. If you would like to continue treatment, please re-order.

## 2021-10-29 NOTE — Progress Notes (Signed)
NAME:  Sherry Baker, MRN:  [id number], DOB:  Aug 22, 1966, LOS: 4 ADMISSION DATE:  10/24/2021, CONSULTATION DATE: 10/25/2021 REFERRING MD:  Terrilee Croak, MD , CHIEF COMPLAINT: Alcohol withdrawal  History of Present Illness:  Patient is encephalopathic so most of the history is taken from chart review.  55 year old female with alcohol dependence, alcoholic liver cirrhosis and polysubstance abuse was brought into the emergency department with altered mental status.  She was found to be agitated, screaming, work-up UDS positive for cocaine and cannabis, UA was positive for probable UTI.  She was admitted to hospitalist service for further evaluation but she started requiring more Ativan for delirium, remained encephalopathic, screaming.  PCCM was consulted for help with evaluation and management.  Pertinent  Medical History   Past Medical History:  Diagnosis Date   Alcohol abuse    Allergy    PCNS swelling   Arthritis    Bipolar 1 disorder ([place])    Cancer ([place]) 01/21/2017   rectal cancer   Cancer ([place])    Cirrhosis of liver ([place])    Depression    Genetic testing 03/24/2017   Ms. Pierro underwent genetic counseling and testing for hereditary cancer syndromes on 02/17/2017. Her results were negative for mutations in all 46 genes analyzed by [facility]'s 46-gene Common Hereditary Cancers Panel. Genes analyzed include: APC, ATM, AXIN2, BARD1, BMPR1A, BRCA1, BRCA2, BRIP1, CDH1, CDKN2A, CHEK2, CTNNA1, DICER1, EPCAM, GREM1, HOXB13, KIT, MEN1, MLH1, MSH2, MSH3, MSH6, MUTYH, NBN,   Hypertension      Significant Hospital Events: Including procedures, antibiotic start and stop dates in addition to other pertinent events   12/23 admitted, started on precedex infusion, librium taper, CIWA based ativan  Interim History / Subjective:   Remains on Precedex drip  Objective   Blood pressure (!) 165/94, pulse 77, temperature 97.8 F (36.6 C), temperature source Axillary, resp. rate 20, height '5\' 2"'   (1.575 m), weight 43.3 kg, SpO2 97 %.        Intake/Output Summary (Last 24 hours) at 10/29/2021 0800 Last data filed at 10/29/2021 0700 Gross per 24 hour  Intake 1224.39 ml  Output 1750 ml  Net -525.61 ml   Filed Weights   10/25/21 1400 10/29/21 0410 10/29/21 0500  Weight: 45 kg 45.5 kg 43.3 kg    Examination: Gen:      No acute distress HEENT:  EOMI, sclera anicteric Neck:     No masses; no thyromegaly Lungs:    Clear to auscultation bilaterally; normal respiratory effort CV:         Regular rate and rhythm; no murmurs Abd:      + bowel sounds; soft, non-tender; no palpable masses, no distension Ext:    No edema; adequate peripheral perfusion Skin:      Warm and dry; no rash Neuro: Sedated  Labs/imaging reviewed Significant for creatinine 1.31, K 2.8 No new imaging  Resolved Hospital Problem list     Assessment & Plan:  Acute toxic-metabolic encephalopathy Moderate-severe alcohol withdrawal Polysubstance abuse Continue CIWA, wean Precedex On Librium taper Haldol as needed  Proteus UTI Continue ceftriaxone for 5 days  CKD stage IIIa, alcohol cirrhosis Monitor labs Add LR infusion Replete K  Chronic diastolic heart failure Uncontrolled hypertension Continue Norvasc, clonidine Hydralazine, labetalol as needed  History of rectal cancer, status postchemotherapy now with colostomy bag Routine colostomy care   Best Practice (right click and "Reselect all SmartList Selections" daily)   Diet/type: tubefeeds DVT prophylaxis: prophylactic heparin  GI prophylaxis: PPI Lines: N/A Foley:  N/A Code Status:  full code Last date of multidisciplinary goals of care discussion [name] is next of kin [phone]. She had MPOA paperwork started last admission but she's unsure if this was fully completed. I updated her today. Confirmed full code although she says that she wouldn't want to be maintained on life support indefinitely.]  Critical care time:     The patient is critically ill with multiple organ system failure and requires high complexity decision making for assessment and support, frequent evaluation and titration of therapies, advanced monitoring, review of radiographic studies and interpretation of complex data.   Critical Care Time devoted to patient care services, exclusive of separately billable procedures, described in this note is 45 minutes.   Marshell Garfinkel MD [facility] See Amion for pager  If no response to pager , please call [phone] until 7pm After 7:00 pm call Elink  [phone] 10/29/2021, 8:00 AM

## 2021-10-30 ENCOUNTER — Inpatient Hospital Stay (HOSPITAL_COMMUNITY): Payer: Medicaid Other

## 2021-10-30 DIAGNOSIS — J9601 Acute respiratory failure with hypoxia: Secondary | ICD-10-CM

## 2021-10-30 DIAGNOSIS — G9340 Encephalopathy, unspecified: Secondary | ICD-10-CM | POA: Diagnosis not present

## 2021-10-30 LAB — CBC
HCT: 32.7 % — ABNORMAL LOW (ref 36.0–46.0)
Hemoglobin: 10.3 g/dL — ABNORMAL LOW (ref 12.0–15.0)
MCH: 28.3 pg (ref 26.0–34.0)
MCHC: 31.5 g/dL (ref 30.0–36.0)
MCV: 89.8 fL (ref 80.0–100.0)
Platelets: 171 10*3/uL (ref 150–400)
RBC: 3.64 MIL/uL — ABNORMAL LOW (ref 3.87–5.11)
RDW: 16.6 % — ABNORMAL HIGH (ref 11.5–15.5)
WBC: 5.3 10*3/uL (ref 4.0–10.5)
nRBC: 0 % (ref 0.0–0.2)

## 2021-10-30 LAB — GLUCOSE, CAPILLARY
Glucose-Capillary: 117 mg/dL — ABNORMAL HIGH (ref 70–99)
Glucose-Capillary: 127 mg/dL — ABNORMAL HIGH (ref 70–99)
Glucose-Capillary: 133 mg/dL — ABNORMAL HIGH (ref 70–99)
Glucose-Capillary: 135 mg/dL — ABNORMAL HIGH (ref 70–99)
Glucose-Capillary: 149 mg/dL — ABNORMAL HIGH (ref 70–99)
Glucose-Capillary: 91 mg/dL (ref 70–99)

## 2021-10-30 LAB — BASIC METABOLIC PANEL WITH GFR
Anion gap: 9 (ref 5–15)
BUN: 13 mg/dL (ref 6–20)
CO2: 20 mmol/L — ABNORMAL LOW (ref 22–32)
Calcium: 8.9 mg/dL (ref 8.9–10.3)
Chloride: 110 mmol/L (ref 98–111)
Creatinine, Ser: 1.25 mg/dL — ABNORMAL HIGH (ref 0.44–1.00)
GFR, Estimated: 52 mL/min — ABNORMAL LOW
Glucose, Bld: 126 mg/dL — ABNORMAL HIGH (ref 70–99)
Potassium: 3.9 mmol/L (ref 3.5–5.1)
Sodium: 139 mmol/L (ref 135–145)

## 2021-10-30 LAB — MAGNESIUM
Magnesium: 1.9 mg/dL (ref 1.7–2.4)
Magnesium: 1.8 mg/dL (ref 1.7–2.4)

## 2021-10-30 LAB — PHOSPHORUS
Phosphorus: 3.5 mg/dL (ref 2.5–4.6)
Phosphorus: 3.4 mg/dL (ref 2.5–4.6)

## 2021-10-30 LAB — PROCALCITONIN: Procalcitonin: <0.1 ng/mL

## 2021-10-30 MED ORDER — ETOMIDATE 2 MG/ML IV SOLN
INTRAVENOUS | Status: AC
  Administered 2021-10-30: 19:00:00 20 mg
  Filled 2021-10-30: qty 10

## 2021-10-30 MED ORDER — DOCUSATE SODIUM 50 MG/5ML PO LIQD
100.0000 mg | Freq: Two times a day (BID) | ORAL | Status: DC
  Administered 2021-10-30 – 2021-10-31 (×3): 100 mg
  Filled 2021-10-30 (×3): qty 10

## 2021-10-30 MED ORDER — MIDAZOLAM HCL 2 MG/2ML IJ SOLN
INTRAMUSCULAR | Status: AC
  Filled 2021-10-30: qty 2

## 2021-10-30 MED ORDER — FENTANYL CITRATE (PF) 100 MCG/2ML IJ SOLN
50.0000 ug | INTRAMUSCULAR | Status: DC | PRN
  Administered 2021-10-30: 20:00:00 200 ug via INTRAVENOUS
  Filled 2021-10-30: qty 4

## 2021-10-30 MED ORDER — SODIUM CHLORIDE 0.9 % IV SOLN
3.0000 g | Freq: Three times a day (TID) | INTRAVENOUS | Status: AC
  Administered 2021-10-30 – 2021-11-03 (×13): 3 g via INTRAVENOUS
  Filled 2021-10-30 (×13): qty 8

## 2021-10-30 MED ORDER — ORAL CARE MOUTH RINSE
15.0000 mL | OROMUCOSAL | Status: DC
  Administered 2021-10-30 – 2021-11-02 (×27): 15 mL via OROMUCOSAL

## 2021-10-30 MED ORDER — WHITE PETROLATUM EX OINT
TOPICAL_OINTMENT | CUTANEOUS | Status: AC
  Filled 2021-10-30: qty 28.35

## 2021-10-30 MED ORDER — POLYETHYLENE GLYCOL 3350 17 G PO PACK
17.0000 g | PACK | Freq: Every day | ORAL | Status: DC
  Administered 2021-10-30 – 2021-10-31 (×2): 17 g
  Filled 2021-10-30 (×2): qty 1

## 2021-10-30 MED ORDER — PROPOFOL 1000 MG/100ML IV EMUL
0.0000 ug/kg/min | INTRAVENOUS | Status: DC
  Filled 2021-10-30: qty 100

## 2021-10-30 MED ORDER — CHLORHEXIDINE GLUCONATE 0.12% ORAL RINSE (MEDLINE KIT)
15.0000 mL | Freq: Two times a day (BID) | OROMUCOSAL | Status: DC
  Administered 2021-10-30 – 2021-11-07 (×15): 15 mL via OROMUCOSAL

## 2021-10-30 MED ORDER — DEXMEDETOMIDINE HCL IN NACL 400 MCG/100ML IV SOLN
0.0000 ug/kg/h | INTRAVENOUS | Status: DC
  Administered 2021-10-30: 19:00:00 0.5 ug/kg/h via INTRAVENOUS
  Filled 2021-10-30: qty 100

## 2021-10-30 MED ORDER — FENTANYL CITRATE (PF) 100 MCG/2ML IJ SOLN
INTRAMUSCULAR | Status: AC
  Filled 2021-10-30: qty 2

## 2021-10-30 MED ORDER — FENTANYL CITRATE (PF) 100 MCG/2ML IJ SOLN: 50.0000 ug | INTRAMUSCULAR | Status: DC | PRN

## 2021-10-30 MED ORDER — FENTANYL 2500MCG IN NS 250ML (10MCG/ML) PREMIX INFUSION
50.0000 ug/h | INTRAVENOUS | Status: DC
  Administered 2021-10-31: 01:00:00 50 ug/h via INTRAVENOUS
  Filled 2021-10-30: qty 250

## 2021-10-30 MED ORDER — DOCUSATE SODIUM 50 MG/5ML PO LIQD: 100.0000 mg | Freq: Two times a day (BID) | ORAL | Status: DC

## 2021-10-30 MED ORDER — LORAZEPAM 2 MG/ML IJ SOLN
1.0000 mg | INTRAMUSCULAR | Status: DC | PRN
Start: 2021-10-30 — End: 2021-10-30
  Administered 2021-10-30: 13:00:00 2 mg via INTRAVENOUS
  Filled 2021-10-30: qty 1

## 2021-10-30 MED ORDER — FENTANYL CITRATE (PF) 100 MCG/2ML IJ SOLN: 50.0000 ug | Freq: Once | INTRAMUSCULAR | Status: DC

## 2021-10-30 MED ORDER — POLYETHYLENE GLYCOL 3350 17 G PO PACK: 17.0000 g | PACK | Freq: Every day | ORAL | Status: DC

## 2021-10-30 MED ORDER — ROCURONIUM BROMIDE 10 MG/ML (PF) SYRINGE
PREFILLED_SYRINGE | INTRAVENOUS | Status: AC
  Administered 2021-10-30: 19:00:00 50 mg
  Filled 2021-10-30: qty 10

## 2021-10-30 MED ORDER — FENTANYL BOLUS VIA INFUSION
50.0000 ug | INTRAVENOUS | Status: DC | PRN
  Administered 2021-10-31: 07:00:00 12.5 ug via INTRAVENOUS
  Administered 2021-10-31 (×2): 25 ug via INTRAVENOUS
  Administered 2021-11-01 (×2): 50 ug via INTRAVENOUS
  Filled 2021-10-30: qty 100

## 2021-10-30 MED ORDER — LORAZEPAM 1 MG PO TABS: 1.0000 mg | ORAL_TABLET | ORAL | Status: DC | PRN

## 2021-10-30 NOTE — Progress Notes (Signed)
1830 pt lethargic unable to cough up her secretions. MD decided to intubate to protect patients airway. Intubation uneventful meds given. Pt tolerated well.

## 2021-10-30 NOTE — Progress Notes (Signed)
[facility] Progress Note Patient Name: Sherry Baker DOB: 06/12/1966 MRN: [id number]   Date of Service  10/30/2021  HPI/Events of Note  Patient is intubated and mechanically ventilated  with sub-optimal sedation orders.  eICU Interventions  Mechanical ventilation PAD protocol orders entered.        Kerry Kass Jacquees Gongora 10/30/2021, 7:45 PM

## 2021-10-30 NOTE — Progress Notes (Addendum)
PCCM Progress Note  Asked to evaluate patient given copious oral secretions, audible rhonchi, and poor cough.    Patient earlier today on room air but since has developed O2 need, currently on 3.5L O2 [place] with saturation of 93%, RR ~31, HR 106, BP 155/85.    Patient alert but confused, tachypneic with some accessory muscle use, audible rhonchi, and weak cough.  Some oral secretions suctioned.  Diffuse rhonchi throughout.  Cortrak noted in left nare- currently clamped  afebrile Net +775 ml Remains off precedex since 9 am  P:  Suspected to be aspirating  NTS prn  CXR now  Continue supplemental O2 for sat goal > 92% High risk for intubation if further deteriorates/ worsening WOB, continue to monitor in ICU. Will cancel transfer to PCU for now.     Addendum: 0076 on reassessment after NTS, patient continues to have audible and auscultated rales/ rhonchi, poor secretion clearance/ cough, and increased work of breathing.  Discussed with attending, plans to intubate for airway protection.   [place], [name] [phone] updated by phone.     Kennieth Rad, ACNP [facility] 10/30/2021, 6:16 PM  See Amion for pager If no response to pager, please call PCCM consult pager After 7:00 pm call Elink

## 2021-10-30 NOTE — Procedures (Signed)
Intubation Procedure Note  Sherry Baker  [id number]  [birth year]  Date:10/30/21  Time:6:49 PM   Provider Performing:Cythia Bachtel Chauncey Cruel Iona Beard    Procedure: Intubation (77939)  Indication(s) Respiratory Failure  Consent Unable to obtain consent due to emergent nature of procedure.   Anesthesia Etomidate and Rocuronium   Time Out Verified patient identification, verified procedure, site/side was marked, verified correct patient position, special equipment/implants available, medications/allergies/relevant history reviewed, required imaging and test results available.   Sterile Technique Usual hand hygeine, masks, and gloves were used   Procedure Description Patient positioned in bed supine.  Sedation given as noted above.  Patient was intubated with endotracheal tube using Glidescope.  View was Grade 1 full glottis .  Number of attempts was 1.  Colorimetric CO2 detector was consistent with tracheal placement.   Complications/Tolerance None; patient tolerated the procedure well. Chest X-ray is ordered to verify placement.   EBL Minimal   Specimen(s) None  Redmond School., MSN, APRN, AGACNP-BC [facility]  10/30/2021 , 6:49 PM  Please see [email or web address] for pager details  If no response, please call [phone] After hours, please call Elink at [phone]

## 2021-10-30 NOTE — Progress Notes (Signed)
RT NTS patient per MD order. RT obtained a moderate amount of thick tan/white secretions as well as blood from NTS. Patients cough is weak. RT will continue to monitor as needed.

## 2021-10-30 NOTE — Progress Notes (Signed)
NAME:  Sherry Baker, MRN:  [id number], DOB:  04-09-1966, LOS: 5 ADMISSION DATE:  10/24/2021, CONSULTATION DATE: 10/25/2021 REFERRING MD:  Terrilee Croak, MD , CHIEF COMPLAINT: Alcohol withdrawal  History of Present Illness:  Patient is encephalopathic so most of the history is taken from chart review.  55 year old female with alcohol dependence, alcoholic liver cirrhosis and polysubstance abuse was brought into the emergency department with altered mental status.  She was found to be agitated, screaming, work-up UDS positive for cocaine and cannabis, UA was positive for probable UTI.  She was admitted to hospitalist service for further evaluation but she started requiring more Ativan for delirium, remained encephalopathic, screaming.  PCCM was consulted for help with evaluation and management.  Pertinent  Medical History   Past Medical History:  Diagnosis Date   Alcohol abuse    Allergy    PCNS swelling   Arthritis    Bipolar 1 disorder ([place])    Cancer ([place]) 01/21/2017   rectal cancer   Cancer ([place])    Cirrhosis of liver ([place])    Depression    Genetic testing 03/24/2017   Ms. Goga underwent genetic counseling and testing for hereditary cancer syndromes on 02/17/2017. Her results were negative for mutations in all 46 genes analyzed by [facility]'s 46-gene Common Hereditary Cancers Panel. Genes analyzed include: APC, ATM, AXIN2, BARD1, BMPR1A, BRCA1, BRCA2, BRIP1, CDH1, CDKN2A, CHEK2, CTNNA1, DICER1, EPCAM, GREM1, HOXB13, KIT, MEN1, MLH1, MSH2, MSH3, MSH6, MUTYH, NBN,   Hypertension      Significant Hospital Events: Including procedures, antibiotic start and stop dates in addition to other pertinent events   12/23 admitted, started on precedex infusion, librium taper, CIWA based ativan   Interim History / Subjective:  Tmax 99 0.7 precedex  Unable to obtain subjective evaluation due to patient status   Objective   Blood pressure (!) 158/88, pulse (!) 149, temperature 99 F  (37.2 C), temperature source Oral, resp. rate (!) 26, height _0  (1.575 m), weight 44.8 kg, SpO2 90 %.        Intake/Output Summary (Last 24 hours) at 10/30/2021 1126 Last data filed at 10/30/2021 1000 Gross per 24 hour  Intake 3115.34 ml  Output 1880 ml  Net 1235.34 ml   Filed Weights   10/29/21 0410 10/29/21 0500 10/30/21 0445  Weight: 45.5 kg 43.3 kg 44.8 kg    Examination: General: In bed, NAD, appears comfortable HEENT: MM pink/moist, anicteric, atraumatic Neuro: RASS -3, PERRL 45m, sedated, moves all extremities, opens eyes to pain CV: S1S2, NSR, no m/r/g appreciated PULM:  coarse in all lobes, trachea midline, chest expansion symmetric GI: soft, bsx4 active, non-tender   Extremities: warm/dry, no pretibial edema, capillary refill less than 3 seconds  Skin:  no rashes or lesions noted  K 2.8>3.9 Creat 1.25 Mg  1.9 HGB 11.5>10.3   Resolved Hospital Problem list     Assessment & Plan:  Acute toxic-metabolic encephalopathy Moderate-severe alcohol withdrawal Polysubstance abuse -Stop precedex, assess status off gtt. Will restart if patient does not tolerate. -Continue librium taper -continue CIWA -Haldol PRN for severe agitation -Mobilize and pulmonary toilet  Proteus UTI -Five days ceftriaxone complete -Monitor fever/wbc curve  CKD stage IIIa,  alcohol cirrhosis Hypokalemia-resolved -Ensure renal perfusion. Goal MAP 65 or greater. -Avoid neprotoxic drugs as possible. -Strict I&O's -Follow up AM creatinine -Goal K above 4, goal MG above 2  Chronic diastolic heart failure Uncontrolled hypertension -continue norvasc, clonidine -PRN hydralazine and labetalol  History of rectal cancer, status postchemotherapy now  with colostomy bag -WOC consulted -Colostomy care   Pressure injury- Chronic, suspect POA -WOC consulted, wound care per WOC -remove foley  Best Practice (right click and "Reselect all SmartList Selections" daily)   Diet/type:  tubefeeds DVT prophylaxis: prophylactic heparin  GI prophylaxis: PPI Lines: N/A Foley:  Yes, and it is no longer needed Code Status:  full code Last date of multidisciplinary goals of care discussion [name] is next of kin [phone]. She had MPOA paperwork started last admission but she's unsure if this was fully completed. I updated her today. Confirmed full code although she says that she wouldn't want to be maintained on life support indefinitely.]  Critical care time:  33 minutes   Redmond School., MSN, APRN, AGACNP-BC [facility]  10/30/2021 , 11:26 AM  Please see [email or web address] for pager details  If no response, please call [phone] After hours, please call Elink at [phone]

## 2021-10-30 NOTE — Progress Notes (Signed)
Pharmacy Antibiotic Note  Sherry Baker is a 55 y.o. female admitted on 10/24/2021 with aspiration pneumonia.  Pharmacy has been consulted for Unasyn dosing.  Note penicillin allergy but has tolerated Unasyn in November and verified with Wandra Arthurs, NP ok to proceed. Patient intubated.   Plan: Unasyn 3g IV every 8 hours.  Monitor renal function.   Height: 5\' 2"  (157.5 cm) Weight: 44.8 kg (98 lb 12.3 oz) IBW/kg (Calculated) : 50.1  Temp (24hrs), Avg:98.1 F (36.7 C), Min:97.4 F (36.3 C), Max:99 F (37.2 C)  Recent Labs  Lab 10/24/21 2350 10/25/21 0135 10/26/21 0410 10/27/21 1501 10/28/21 0456 10/29/21 0727 10/30/21 0431  WBC  --    < > 2.7* 2.7* 2.8* 4.2 5.3  CREATININE 1.36*  --  1.05* 1.25* 1.24* 1.31* 1.25*  LATICACIDVEN 1.1  --   --   --   --   --   --    < > = values in this interval not displayed.    Estimated Creatinine Clearance: 36.8 mL/min (A) (by C-G formula based on SCr of 1.25 mg/dL (H)).    Allergies  Allergen Reactions   Penicillins Hives and Swelling    Tolerated Unasyn, Rocephin Did it involve swelling of the face/tongue/throat, SOB, or low BP? Yes Did it involve sudden or severe rash/hives, skin peeling, or any reaction on the inside of your mouth or nose? No Did you need to seek medical attention at a hospital or doctor's office? No When did it last happen?  <10 years   If all above answers are "NO", may proceed with cephalosporin use.     Antimicrobials this admission: Ceftriaxone 12/23 >>12/27 Unansyn 12/28 >>  Dose adjustments this admission:   Microbiology results: pending  Thank you for allowing pharmacy to be a part of this patients care.  Brain Hilts 10/30/2021 7:20 PM

## 2021-10-30 NOTE — Procedures (Signed)
Cortrak  Person Inserting Tube:  Tarl Cephas, [name], RD Tube Type:  Cortrak - 43 inches Tube Size:  10 Tube Location:  Left nare Initial Placement:  Stomach Secured by: Bridle Technique Used to Measure Tube Placement:  Marking at nare/corner of mouth Cortrak Secured At:  60 cm  Cortrak Tube Team Note:  Consult received to place a Cortrak feeding tube.   X-ray is required, abdominal x-ray has been ordered by the Cortrak team. Please confirm tube placement before using the Cortrak tube.   If the tube becomes dislodged please keep the tube and contact the Cortrak team at [email or web address] ([email or web address]) for replacement.  If after hours and replacement cannot be delayed, place a NG tube and confirm placement with an abdominal x-ray.     [name]., MS, RD, LDN (she/her/hers) RD pager number and weekend/on-call pager number located in [place].

## 2021-10-31 DIAGNOSIS — Z9911 Dependence on respirator [ventilator] status: Secondary | ICD-10-CM

## 2021-10-31 DIAGNOSIS — G9340 Encephalopathy, unspecified: Secondary | ICD-10-CM | POA: Diagnosis not present

## 2021-10-31 LAB — HEMOGLOBIN A1C
Hgb A1c MFr Bld: 5.2 % (ref 4.8–5.6)
Mean Plasma Glucose: 102.54 mg/dL

## 2021-10-31 LAB — GLUCOSE, CAPILLARY
Glucose-Capillary: 102 mg/dL — ABNORMAL HIGH (ref 70–99)
Glucose-Capillary: 111 mg/dL — ABNORMAL HIGH (ref 70–99)
Glucose-Capillary: 122 mg/dL — ABNORMAL HIGH (ref 70–99)
Glucose-Capillary: 125 mg/dL — ABNORMAL HIGH (ref 70–99)
Glucose-Capillary: 136 mg/dL — ABNORMAL HIGH (ref 70–99)
Glucose-Capillary: 150 mg/dL — ABNORMAL HIGH (ref 70–99)

## 2021-10-31 LAB — BASIC METABOLIC PANEL
Anion gap: 6 (ref 5–15)
BUN: 14 mg/dL (ref 6–20)
CO2: 22 mmol/L (ref 22–32)
Calcium: 8.6 mg/dL — ABNORMAL LOW (ref 8.9–10.3)
Chloride: 108 mmol/L (ref 98–111)
Creatinine, Ser: 1.4 mg/dL — ABNORMAL HIGH (ref 0.44–1.00)
GFR, Estimated: 45 mL/min — ABNORMAL LOW (ref >60)
Glucose, Bld: 128 mg/dL — ABNORMAL HIGH (ref 70–99)
Potassium: 3.5 mmol/L (ref 3.5–5.1)
Sodium: 136 mmol/L (ref 135–145)

## 2021-10-31 LAB — TRIGLYCERIDES: Triglycerides: 40 mg/dL (ref <150)

## 2021-10-31 LAB — PROCALCITONIN: Procalcitonin: <0.1 ng/mL

## 2021-10-31 MED ORDER — POTASSIUM CHLORIDE 20 MEQ PO PACK
40.0000 meq | PACK | Freq: Once | ORAL | Status: AC
  Administered 2021-10-31: 08:00:00 40 meq
  Filled 2021-10-31: qty 2

## 2021-10-31 MED ORDER — METHYLPREDNISOLONE SODIUM SUCC 40 MG IJ SOLR
40.0000 mg | INTRAMUSCULAR | Status: AC
  Administered 2021-10-31 – 2021-11-01 (×4): 40 mg via INTRAVENOUS
  Filled 2021-10-31 (×5): qty 1

## 2021-10-31 MED ORDER — INSULIN ASPART 100 UNIT/ML IJ SOLN
0.0000 [IU] | INTRAMUSCULAR | Status: DC
  Administered 2021-10-31 (×3): 3 [IU] via SUBCUTANEOUS
  Administered 2021-11-01: 08:00:00 4 [IU] via SUBCUTANEOUS
  Administered 2021-11-01 (×2): 3 [IU] via SUBCUTANEOUS
  Administered 2021-11-01: 04:00:00 4 [IU] via SUBCUTANEOUS
  Administered 2021-11-02 (×2): 3 [IU] via SUBCUTANEOUS

## 2021-10-31 MED ORDER — POTASSIUM CHLORIDE 20 MEQ PO PACK
40.0000 meq | PACK | Freq: Once | ORAL | Status: AC
  Administered 2021-10-31: 12:00:00 40 meq
  Filled 2021-10-31: qty 2

## 2021-10-31 MED ORDER — FUROSEMIDE 10 MG/ML IJ SOLN
40.0000 mg | Freq: Once | INTRAMUSCULAR | Status: AC
  Administered 2021-10-31: 11:00:00 40 mg via INTRAVENOUS
  Filled 2021-10-31: qty 4

## 2021-10-31 NOTE — Progress Notes (Signed)
[facility] ADULT ICU REPLACEMENT PROTOCOL   The patient does apply for the [facility] Adult ICU Electrolyte Replacment Protocol based on the criteria listed below:   1.Exclusion criteria: TCTS patients, ECMO patients, and Dialysis patients 2. Is GFR >/= 30 ml/min? Yes.    Patient's GFR today is 45 3. Is SCr </= 2? Yes.   Patient's SCr is 1.40 mg/dL 4. Did SCr increase >/= 0.5 in 24 hours? No. 5.Pt's weight >40kg  Yes.   6. Abnormal electrolyte(s): K+ 3.5  7. Electrolytes replaced per protocol 8.  Call MD STAT for K+ </= 2.5, Phos </= 1, or Mag </= 1 Physician:  n/a  Sherry Baker 10/31/2021 6:20 AM

## 2021-10-31 NOTE — Progress Notes (Signed)
Nutrition Follow-up  DOCUMENTATION CODES:   Underweight, Severe malnutrition in context of chronic illness  INTERVENTION:   Continue tube feeds via Cortrak: - Osmolite 1.5 @ 55 ml/hr (1320 ml/day)  Tube feeding regimen provides 1980 kcal, 83 grams of protein, and 1006 ml of H2O.   - Continue MVI with minerals daily per tube  NUTRITION DIAGNOSIS:   Severe Malnutrition related to chronic illness (cirrhosis, polysubstance abuse) as evidenced by severe muscle depletion, severe fat depletion.  New diagnosis after completion of NFPE  GOAL:   Patient will meet greater than or equal to 90% of their needs  Met via TF  MONITOR:   Vent status, Labs, Weight trends, TF tolerance, Skin  REASON FOR ASSESSMENT:   Consult Enteral/tube feeding initiation and management  ASSESSMENT:   55 yo female with a PMH of alcohol dependence, alcoholic liver cirrhosis, rectal cancer s/p colostomy, and polysubstance abuse who presents with altered mental status. She was found to be agitated, screaming, work-up UDS positive for cocaine and cannabis, UA was positive for probable UTI.  12/24 - NG tube placed 12/28 - Cortrak placed (tip gastric), aspiration event, intubated  Discussed pt with RN. Possible extubation tomorrow. Pt tolerating tube feeds at goal.  Reviewed weight history in chart. Pt with a 1.9 kg weight loss since 04/08/21. This is a 3.8% weight loss in just under 7 months which is not significant for timeframe. NFPE completed. Pt meets criteria for severe malnutrition.  Current TF: Osmolite 1.5 @ 55 ml/hr, free water flushes 150 ml q 4 hours  Patient is currently intubated on ventilator support MV: 6.3 L/min Temp (24hrs), Avg:97.7 F (36.5 C), Min:96.2 F (35.7 C), Max:98.9 F (37.2 C)  Drips: Fentanyl  Medications reviewed and include: colace, folic acid, SSI q 4 hours, lactulose, IV solu-medrol, MVI with minerals, protonix, miralax, thiamine, IV abx  Labs reviewed: creatinine  1.40 CBG's: 91-133 x 24 hours  UOP: 975 ml x 24 hours Colostomy: 540 ml x 24 hours I/O's: +1.8 L since admit  NUTRITION - FOCUSED PHYSICAL EXAM:  Flowsheet Row Most Recent Value  Orbital Region Severe depletion  Upper Arm Region Severe depletion  Thoracic and Lumbar Region Severe depletion  Buccal Region Severe depletion  Temple Region Moderate depletion  Clavicle Bone Region Severe depletion  Clavicle and Acromion Bone Region Severe depletion  Scapular Bone Region Severe depletion  Dorsal Hand Severe depletion  Patellar Region Severe depletion  Anterior Thigh Region Severe depletion  Posterior Calf Region Severe depletion  Edema (RD Assessment) None  Hair Reviewed  Eyes Unable to assess  Mouth Other (Comment)  [multiple missing teeth, fractured teeth, cavities]  Skin Reviewed  Nails Reviewed       Diet Order:   Diet Order             Diet NPO time specified Except for: Sips with Meds  Diet effective now                   EDUCATION NEEDS:   Not appropriate for education at this time  Skin:  Skin Assessment: Skin Integrity Issues: Stage III: Sacrum  Last BM:  10/31/21 colostomy  Height:   Ht Readings from Last 1 Encounters:  10/25/21 '5\' 2"'  (1.575 m)    Weight:   Wt Readings from Last 1 Encounters:  10/31/21 47.5 kg    BMI:  Body mass index is 19.15 kg/m.  Estimated Nutritional Needs:   Kcal:  1800-2000  Protein:  70-85 grams  Fluid:  >  1.8 L    Gustavus Bryant, MS, RD, LDN Inpatient Clinical Dietitian Please see AMiON for contact information.

## 2021-10-31 NOTE — Progress Notes (Signed)
NAME:  Sherry Baker, MRN:  [id number], DOB:  [birth year], LOS: 6 ADMISSION DATE:  10/24/2021, CONSULTATION DATE: 10/25/2021 REFERRING MD:  Sherry Croak, MD , CHIEF COMPLAINT: Alcohol withdrawal  History of Present Illness:  Patient is encephalopathic so most of the history is taken from chart review.  55 year old female with alcohol dependence, alcoholic liver cirrhosis and polysubstance abuse was brought into the emergency department with altered mental status.  She was found to be agitated, screaming, work-up UDS positive for cocaine and cannabis, UA was positive for probable UTI.  She was admitted to hospitalist service for further evaluation but she started requiring more Ativan for delirium, remained encephalopathic, screaming.  PCCM was consulted for help with evaluation and management.  Pertinent  Medical History   Past Medical History:  Diagnosis Date   Alcohol abuse    Allergy    PCNS swelling   Arthritis    Bipolar 1 disorder ([place])    Cancer ([place]) 01/21/2017   rectal cancer   Cancer ([place])    Cirrhosis of liver ([place])    Depression    Genetic testing 03/24/2017   Sherry Baker underwent genetic counseling and testing for hereditary cancer syndromes on 02/17/2017. Her results were negative for mutations in all 46 genes analyzed by [facility]'s 46-gene Common Hereditary Cancers Panel. Genes analyzed include: APC, ATM, AXIN2, BARD1, BMPR1A, BRCA1, BRCA2, BRIP1, CDH1, CDKN2A, CHEK2, CTNNA1, DICER1, EPCAM, GREM1, HOXB13, KIT, MEN1, MLH1, MSH2, MSH3, MSH6, MUTYH, NBN,   Hypertension      Significant Hospital Events: Including procedures, antibiotic start and stop dates in addition to other pertinent events   12/23 admitted, started on precedex infusion, librium taper, CIWA based ativan 12/28 aspirated, hypoxic, intubated   Interim History / Subjective:  More awake this morning, not nodding to answer questions.  Objective   Blood pressure (!) 80/55, pulse 73, temperature (!) 96.2  F (35.7 C), temperature source Axillary, resp. rate 18, height _0  (1.575 m), weight 47.5 kg, SpO2 99 %.    Vent Mode: PRVC FiO2 (%):  [40 %-100 %] 40 % Set Rate:  [18 bmp] 18 bmp Vt Set:  [400 mL] 400 mL PEEP:  [5 cmH20] 5 cmH20 Plateau Pressure:  [15 cmH20] 15 cmH20   Intake/Output Summary (Last 24 hours) at 10/31/2021 0730 Last data filed at 10/31/2021 0600 Gross per 24 hour  Intake 3006.26 ml  Output 1515 ml  Net 1491.26 ml    Filed Weights   10/29/21 0500 10/30/21 0445 10/31/21 0500  Weight: 43.3 kg 44.8 kg 47.5 kg    Examination: General: chronically ill appearing woman lying in bed in NAD HEENT: Sherry Baker/AT, eyes anicteric Neuro: awake, strong gag reflex with oral and endotracheal suctioning, not following commands but moving extremities spontaneously CV: S1S2, RRR PULM:  minimal thick tan secretions from ETT, CTAB. No cuff leak today. GI: soft, NT, ostomy with stool output Extremities:  no edema, no cyanosis Skin:  warm, dry, no rashes  BUN 14 Cr 1.4 PCT <0.1 BG 110-130s CXR personally reviewed> RLL infiltrate silhouetting lateral R hemidiaphragm   Resolved Hospital Problem list     Assessment & Plan:  Acute respiratory failure requiring MV, aspiration pneumonia vs pneumonitis; aspirated due to dysphagia Glottic edema at time of intubation -LTVV, 4-8cc/kg IBW with goal Pplat<30 and DP<15 -PAD protocol for sedation -VAP prevention protocol -collect respiratory culture when able -needs cuff leak test before extubation -daily SAT & SBT -solumedrol for failed cuff leak test  Dysphagia -needs SLP evaluation after  extubation  Acute toxic-metabolic encephalopathy Moderate-severe alcohol withdrawal Polysubstance abuse -fentanyl infusion for vent sedation, RASS is appropriate at 0 -Con't librium taper. -CIWA -Haldol PRN for severe agitation -Mobilize and pulmonary toilet  Proteus UTI -completed 5 days of ceftriaxone previously  CKD stage IIIa -renally  dose meds, avoid nephrotoxic meds -strict I/Os -monitor  alcohol cirrhosis -monitor for HE -goal net even volume status -stop LR infusion  Hypokalemia-resolved -monitor  Chronic diastolic heart failure Uncontrolled hypertension -hold norvasc, clonidine due to low Bps on sedation -PRN hydralazine and labetalol  History of rectal cancer, status post chemotherapy now with colostomy bag -routine colostomy care  Pressure injury- Chronic, suspect POA -WOC consulted   Best Practice (right click and "Reselect all SmartList Selections" daily)   Diet/type: tubefeeds DVT prophylaxis: prophylactic heparin  GI prophylaxis: PPI Lines: N/A Foley:  Yes, and it is still needed Code Status:  full code Last date of multidisciplinary goals of care discussion Sherry Baker is next of kin [phone]. She had MPOA paperwork started last admission but she's unsure if this was fully completed. I updated her today. Confirmed full code although she says that she wouldn't want to be maintained on life support indefinitely.]  Critical care time: 35 min.   Sherry Hy, DO 10/31/21 9:38 AM [facility]

## 2021-11-01 ENCOUNTER — Inpatient Hospital Stay (HOSPITAL_COMMUNITY): Payer: Medicaid Other

## 2021-11-01 DIAGNOSIS — G9340 Encephalopathy, unspecified: Secondary | ICD-10-CM | POA: Diagnosis not present

## 2021-11-01 DIAGNOSIS — Z9911 Dependence on respirator [ventilator] status: Secondary | ICD-10-CM | POA: Diagnosis not present

## 2021-11-01 DIAGNOSIS — F1020 Alcohol dependence, uncomplicated: Secondary | ICD-10-CM

## 2021-11-01 LAB — COMPREHENSIVE METABOLIC PANEL
ALT: 10 U/L (ref 0–44)
AST: 16 U/L (ref 15–41)
Albumin: 2.2 g/dL — ABNORMAL LOW (ref 3.5–5.0)
Alkaline Phosphatase: 53 U/L (ref 38–126)
Anion gap: 7 (ref 5–15)
BUN: 19 mg/dL (ref 6–20)
CO2: 24 mmol/L (ref 22–32)
Calcium: 8.8 mg/dL — ABNORMAL LOW (ref 8.9–10.3)
Chloride: 106 mmol/L (ref 98–111)
Creatinine, Ser: 1.47 mg/dL — ABNORMAL HIGH (ref 0.44–1.00)
GFR, Estimated: 42 mL/min — ABNORMAL LOW (ref >60)
Glucose, Bld: 156 mg/dL — ABNORMAL HIGH (ref 70–99)
Potassium: 5.1 mmol/L (ref 3.5–5.1)
Sodium: 137 mmol/L (ref 135–145)
Total Bilirubin: 0.3 mg/dL (ref 0.3–1.2)
Total Protein: 6.4 g/dL — ABNORMAL LOW (ref 6.5–8.1)

## 2021-11-01 LAB — CBC WITH DIFFERENTIAL/PLATELET
Abs Immature Granulocytes: 0.06 10*3/uL (ref 0.00–0.07)
Basophils Absolute: 0 10*3/uL (ref 0.0–0.1)
Basophils Relative: 0 %
Eosinophils Absolute: 0 10*3/uL (ref 0.0–0.5)
Eosinophils Relative: 0 %
HCT: 32.7 % — ABNORMAL LOW (ref 36.0–46.0)
Hemoglobin: 10 g/dL — ABNORMAL LOW (ref 12.0–15.0)
Immature Granulocytes: 1 %
Lymphocytes Relative: 11 %
Lymphs Abs: 0.6 10*3/uL — ABNORMAL LOW (ref 0.7–4.0)
MCH: 28.2 pg (ref 26.0–34.0)
MCHC: 30.6 g/dL (ref 30.0–36.0)
MCV: 92.1 fL (ref 80.0–100.0)
Monocytes Absolute: 0.1 10*3/uL (ref 0.1–1.0)
Monocytes Relative: 3 %
Neutro Abs: 4.4 10*3/uL (ref 1.7–7.7)
Neutrophils Relative %: 85 %
Platelets: 194 10*3/uL (ref 150–400)
RBC: 3.55 MIL/uL — ABNORMAL LOW (ref 3.87–5.11)
RDW: 16.6 % — ABNORMAL HIGH (ref 11.5–15.5)
WBC: 5.2 10*3/uL (ref 4.0–10.5)
nRBC: 0 % (ref 0.0–0.2)

## 2021-11-01 LAB — GLUCOSE, CAPILLARY
Glucose-Capillary: 101 mg/dL — ABNORMAL HIGH (ref 70–99)
Glucose-Capillary: 125 mg/dL — ABNORMAL HIGH (ref 70–99)
Glucose-Capillary: 126 mg/dL — ABNORMAL HIGH (ref 70–99)
Glucose-Capillary: 137 mg/dL — ABNORMAL HIGH (ref 70–99)
Glucose-Capillary: 154 mg/dL — ABNORMAL HIGH (ref 70–99)
Glucose-Capillary: 179 mg/dL — ABNORMAL HIGH (ref 70–99)

## 2021-11-01 LAB — MAGNESIUM: Magnesium: 1.9 mg/dL (ref 1.7–2.4)

## 2021-11-01 LAB — PROCALCITONIN: Procalcitonin: <0.1 ng/mL

## 2021-11-01 LAB — AMMONIA: Ammonia: 23 umol/L (ref 9–35)

## 2021-11-01 MED ORDER — FENTANYL CITRATE (PF) 100 MCG/2ML IJ SOLN: 50.0000 ug | INTRAMUSCULAR | Status: DC | PRN

## 2021-11-01 MED ORDER — QUETIAPINE FUMARATE 25 MG PO TABS
25.0000 mg | ORAL_TABLET | Freq: Two times a day (BID) | ORAL | Status: DC
  Administered 2021-11-01 – 2021-11-02 (×3): 25 mg
  Filled 2021-11-01 (×3): qty 1

## 2021-11-01 MED ORDER — MAGNESIUM SULFATE 2 GM/50ML IV SOLN
2.0000 g | Freq: Once | INTRAVENOUS | Status: AC
  Administered 2021-11-01: 07:00:00 2 g via INTRAVENOUS
  Filled 2021-11-01: qty 50

## 2021-11-01 MED ORDER — POLYETHYLENE GLYCOL 3350 17 G PO PACK
17.0000 g | PACK | Freq: Every day | ORAL | Status: DC
  Administered 2021-11-02 – 2021-11-15 (×14): 17 g
  Filled 2021-11-01 (×15): qty 1

## 2021-11-01 MED ORDER — CLONIDINE HCL 0.1 MG PO TABS
0.1000 mg | ORAL_TABLET | Freq: Three times a day (TID) | ORAL | Status: DC
  Administered 2021-11-01 – 2021-11-04 (×10): 0.1 mg via NASOGASTRIC
  Filled 2021-11-01 (×10): qty 1

## 2021-11-01 MED ORDER — DEXMEDETOMIDINE HCL IN NACL 400 MCG/100ML IV SOLN
0.2000 ug/kg/h | INTRAVENOUS | Status: DC
  Administered 2021-11-01: 16:00:00 0.2 ug/kg/h via INTRAVENOUS
  Filled 2021-11-01 (×2): qty 100

## 2021-11-01 MED ORDER — PHENOBARBITAL SODIUM 130 MG/ML IJ SOLN
130.0000 mg | Freq: Three times a day (TID) | INTRAMUSCULAR | Status: DC
  Administered 2021-11-01 – 2021-11-02 (×2): 130 mg via INTRAVENOUS
  Filled 2021-11-01 (×2): qty 1

## 2021-11-01 MED ORDER — DOCUSATE SODIUM 50 MG/5ML PO LIQD
100.0000 mg | Freq: Two times a day (BID) | ORAL | Status: DC
  Administered 2021-11-01 – 2021-11-15 (×28): 100 mg
  Filled 2021-11-01 (×28): qty 10

## 2021-11-01 MED ORDER — SODIUM CHLORIDE 0.9 % IV SOLN
260.0000 mg | Freq: Once | INTRAVENOUS | Status: AC
  Administered 2021-11-01: 18:00:00 260 mg via INTRAVENOUS
  Filled 2021-11-01: qty 2

## 2021-11-01 NOTE — Progress Notes (Signed)
[facility] ADULT ICU REPLACEMENT PROTOCOL   The patient does apply for the [facility] Adult ICU Electrolyte Replacment Protocol based on the criteria listed below:   1.Exclusion criteria: TCTS patients, ECMO patients, and Dialysis patients 2. Is GFR >/= 30 ml/min? Yes.    Patient's GFR today is 42 3. Is SCr </= 2? Yes.   Patient's SCr is 1.47 mg/dL 4. Did SCr increase >/= 0.5 in 24 hours? No. 5.Pt's weight >40kg  Yes.   6. Abnormal electrolyte(s): mag 1.9  7. Electrolytes replaced per protocol 8.  Call MD STAT for K+ </= 2.5, Phos </= 1, or Mag </= 1 Physician:  n/a  Sherry Baker 11/01/2021 6:33 AM

## 2021-11-01 NOTE — Procedures (Signed)
Extubation Procedure Note  Patient Details:   Name: Sherry Baker DOB: Nov 02, 1966 MRN: [id number]   Airway Documentation:    Vent end date: 11/01/21 Vent end time: 1029   Evaluation  O2 sats: stable throughout Complications: No apparent complications Patient did tolerate procedure well. Bilateral Breath Sounds: Diminished, Clear   Yes  RT extubated patient to 3L [place] per MD order with RN at bedside. Positive cuff leak noted. Patient tolerated well. No stridor noted at this time. RT will continue to monitor as needed.   Fabiola Backer 11/01/2021, 10:30 AM

## 2021-11-01 NOTE — Evaluation (Signed)
Physical Therapy Evaluation Patient Details Name: Sherry Baker MRN: [id number] DOB: 09/06/1966 Today's Date: 11/01/2021  History of Present Illness  55 yo admitted 12/22 with AMS and encephalopathy. Pt with aspiration requiring intubation 12/28. Extubated 12/30. PMhx: ETOH cirrhosis, polysubstance abuse, bipolar, CHF, CKD, HTn, anxiety, rectal CA s/p ostomy  Clinical Impression  Pt with confusion and unable to provide home setup or PLOf. Pt hallucinating that she has crack in her hand and not wanting to open her hand and "spill it". Pt with decreased cognition, mobility and balance who will benefit from acute therapy to maximize mobility, safety and function. Pt without known assist at D/C and recommend SNf given current function.   97% on RA     Recommendations for follow up therapy are one component of a multi-disciplinary discharge planning process, led by the attending physician.  Recommendations may be updated based on patient status, additional functional criteria and insurance authorization.  Follow Up Recommendations Skilled nursing-short term rehab (<3 hours/day)    Assistance Recommended at Discharge Frequent or constant Supervision/Assistance  Functional Status Assessment Patient has had a recent decline in their functional status and demonstrates the ability to make significant improvements in function in a reasonable and predictable amount of time.  Equipment Recommendations  Rolling walker (2 wheels);BSC/3in1    Recommendations for Other Services       Precautions / Restrictions Precautions Precautions: Fall      Mobility  Bed Mobility Overal bed mobility: Needs Assistance Bed Mobility: Supine to Sit     Supine to sit: HOB elevated;Min assist     General bed mobility comments: min assist with increased time and cues with HOB 30 degrees, min assist to fully elevate trunk    Transfers Overall transfer level: Needs assistance   Transfers: Sit to/from  Stand;Bed to chair/wheelchair/BSC Sit to Stand: Min assist   Step pivot transfers: Min assist       General transfer comment: min assist to rise from surface with assist for balance, min assist with face to face sequence and UE support to pivot to chair    Ambulation/Gait                  Stairs            Wheelchair Mobility    Modified Rankin (Stroke Patients Only)       Balance Overall balance assessment: Needs assistance   Sitting balance-Leahy Scale: Fair Sitting balance - Comments: EOb with guarding for safety   Standing balance support: Single extremity supported;Bilateral upper extremity supported Standing balance-Leahy Scale: Poor Standing balance comment: UE support and physical assist for balance in standing                             Pertinent Vitals/Pain Pain Assessment: No/denies pain    Home Living Family/patient expects to be discharged to:: Unsure                   Additional Comments: No family/caregivers present to provide any reliable information; pt unable to answer questions regarding home or PLOF    Prior Function Prior Level of Function : Patient poor historian/Family not available                     Hand Dominance        Extremity/Trunk Assessment   Upper Extremity Assessment Upper Extremity Assessment: Generalized weakness    Lower Extremity Assessment Lower  Extremity Assessment: Generalized weakness    Cervical / Trunk Assessment Cervical / Trunk Assessment: Normal  Communication   Communication: No difficulties  Cognition Arousal/Alertness: Awake/alert Behavior During Therapy: Flat affect Overall Cognitive Status: Impaired/Different from baseline Area of Impairment: Orientation;Attention;Memory;Following commands;Safety/judgement;Awareness;Problem solving                 Orientation Level: Disoriented to;Time;Place;Situation Current Attention Level: Focused Memory:  Decreased short-term memory Following Commands: Follows one step commands inconsistently;Follows one step commands with increased time Safety/Judgement: Decreased awareness of safety;Decreased awareness of deficits Awareness: Intellectual Problem Solving: Slow processing;Requires verbal cues;Requires tactile cues          General Comments      Exercises     Assessment/Plan    PT Assessment Patient needs continued PT services  PT Problem List Decreased strength;Decreased mobility;Decreased safety awareness;Decreased activity tolerance;Decreased cognition;Decreased balance;Decreased knowledge of use of DME       PT Treatment Interventions Gait training;Balance training;Functional mobility training;Stair training;Cognitive remediation;Therapeutic activities;Patient/family education;DME instruction;Therapeutic exercise    PT Goals (Current goals can be found in the Care Plan section)  Acute Rehab PT Goals PT Goal Formulation: Patient unable to participate in goal setting Time For Goal Achievement: 11/15/21 Potential to Achieve Goals: Fair    Frequency Min 3X/week   Barriers to discharge Decreased caregiver support      Co-evaluation               AM-PAC PT "6 Clicks" Mobility  Outcome Measure Help needed turning from your back to your side while in a flat bed without using bedrails?: A Little Help needed moving from lying on your back to sitting on the side of a flat bed without using bedrails?: A Little Help needed moving to and from a bed to a chair (including a wheelchair)?: A Little Help needed standing up from a chair using your arms (e.g., wheelchair or bedside chair)?: A Little Help needed to walk in hospital room?: A Lot Help needed climbing 3-5 steps with a railing? : Total 6 Click Score: 15    End of Session Equipment Utilized During Treatment: Gait belt Activity Tolerance: Patient tolerated treatment well Patient left: in chair;with call bell/phone  within reach;with restraints reapplied;with chair alarm set Nurse Communication: Mobility status PT Visit Diagnosis: Other abnormalities of gait and mobility (R26.89);Difficulty in walking, not elsewhere classified (R26.2)    Time: 1255-1320 PT Time Calculation (min) (ACUTE ONLY): 25 min   Charges:   PT Evaluation $PT Eval Moderate Complexity: 1 Mod PT Treatments $Therapeutic Activity: 8-22 mins        Rosaly Labarbera P, PT Acute Rehabilitation Services Pager: [phone] Office: [phone]   Merleen Picazo B [facility] Carel 11/01/2021, 1:41 PM

## 2021-11-01 NOTE — Progress Notes (Addendum)
Extubated earlier today Remains fairly agitated. On my arrival she was laying sideways in bed. Demanding to go home. Was not able to be reasoned with. She had received haldol earlier. Is already on scheduled clonidine and also librium  Impression On-going ETOH w/d complicated by element of metabolic encephalopathy  Plan Will start low dose precedex Cont libirum, clonidine and seroquel  Safety restraints  Keep in ICU   Erick Colace ACNP-BC [place] Pager # [phone] OR # [phone] if no answer

## 2021-11-01 NOTE — Progress Notes (Signed)
NAME:  Sherry Baker, MRN:  [id number], DOB:  Mar 30, 1966, LOS: 7 ADMISSION DATE:  10/24/2021, CONSULTATION DATE: 10/25/2021 REFERRING MD:  Terrilee Croak, MD , CHIEF COMPLAINT: Alcohol withdrawal  History of Present Illness:  Patient is encephalopathic so most of the history is taken from chart review.  55 year old female with alcohol dependence, alcoholic liver cirrhosis and polysubstance abuse was brought into the emergency department with altered mental status.  She was found to be agitated, screaming, work-up UDS positive for cocaine and cannabis, UA was positive for probable UTI.  She was admitted to hospitalist service for further evaluation but she started requiring more Ativan for delirium, remained encephalopathic, screaming.  PCCM was consulted for help with evaluation and management.  Pertinent  Medical History   Past Medical History:  Diagnosis Date   Alcohol abuse    Arthritis    Bipolar 1 disorder ([place])    Cancer ([facility])->rectal     Cirrhosis of liver ([place])    Depression    Hypertension     Significant Hospital Events: Including procedures, antibiotic start and stop dates in addition to other pertinent events   12/23 admitted, started on precedex infusion, librium taper, CIWA based ativan 12/28 aspirated, hypoxic, intubated, noted to have glottic edema. Unasyn started. Sputum sent.  12/29 got solumedrol for glottal edema  12/30: cuff leak present but weak. Attempting SBT.    Interim History / Subjective:  I placed her on spontaneous breathing trial this morning.  Tidal volumes acceptable however she is fairly anxious, and then intermittently sedated to the point she does not trigger spontaneous ventilation. Objective   Blood pressure 118/65, pulse (Abnormal) 46, temperature 97.6 F (36.4 C), temperature source Axillary, resp. rate 18, height 5\' 2"  (1.575 m), weight 47.7 kg, SpO2 100 %.    Vent Mode: PRVC FiO2 (%):  [30 %-40 %] 30 % Set Rate:  [18 bmp] 18 bmp Vt  Set:  [400 mL] 400 mL PEEP:  [5 cmH20] 5 cmH20 Plateau Pressure:  [12 cmH20-17 cmH20] 17 cmH20   Intake/Output Summary (Last 24 hours) at 11/01/2021 0724 Last data filed at 11/01/2021 0540 Gross per 24 hour  Intake 2463.5 ml  Output 2695 ml  Net -231.5 ml   Filed Weights   10/30/21 0445 10/31/21 0500 11/01/21 0500  Weight: 44.8 kg 47.5 kg 47.7 kg    Examination: General: This is a chronically ill-appearing 55 year old white female she is currently on full ventilatory support after failed SBT attempt HEENT normocephalic atraumatic poor dentition noted she is orally intubated and has a small bore feeding tube located in her left nare.  There is no jugular venous distention.  Mucous membranes are moist.  Cuff leak is present yet weak and somewhat positional following systemic steroids Pulmonary: Some scattered rhonchi with cough, otherwise clear no wheezing.  Diminished bases.  Spontaneous breathing trial on pressure support 5/PEEP 5 giving spontaneous volumes in the 4-500 range intermittently however was triggering intermittent apnea alarm as well Last cxr 12/28: (I personally reviewed) Right LL airspace disease.  Cardiac: Bradycardia noted regular rate and rhythm no murmur rub or gallop appreciated Abdomen soft, colostomy unremarkable.  Stool present Extremities warm dry with brisk capillary refill and no edema Neuro anxious, moves all extremities.  Trying to communicate.  No focal deficits appreciated Resolved Hospital Problem list   Hyperkalemia (resolved) Proteus UTI -completed 5 days of ceftriaxone previously  Assessment & Plan:   Acute toxic-metabolic encephalopathy Moderate-severe alcohol withdrawal Polysubstance abuse Plan Cont Librium RASS goal  0 CIWA PRN haldol  Discontinue fentanyl drip Changed to as needed If continuous sedation again needed would place her back on Precedex instead of fentanyl.  This was discontinued earlier due to bradycardia however she has  bradycardia even with fentanyl, Add back low-dose clonidine via tube Thiamine and folate   Acute respiratory failure requiring MV, aspiration pneumonia vs pneumonitis; aspirated due to dysphagia Glottic edema at time of intubation Pct neg  Cuff leak present but weak and positional Plan Cont full vent support w/ daily SBT Will check for cuff leak prior to extubation (got solumedrol)  VAP bundle  PAD protocol (addressed above)  F/u pending sputum from 12/29 Day 3 of 5 unasyn  F/u cxr   Dysphagia Plan Needs SLP eval s/p extubation   CKD stage IIIa -scr a little worse Plan Keep even volume status Try to avoid hypotension Renal adjust meds Strick I/O Am chem   alcohol cirrhosis Plan Monitor mental status  Ck ammonia  Needs to stop drinking  Chronic diastolic heart failure Uncontrolled hypertension Plan Holding Norvasc d/t sedatopm re;ated hypotension PRN Hydralazine   Mild hyperglycemia -suspect exacerbated by steroids Plan Ssi (resistant scale)  140-180 goal   History of rectal cancer, status post chemotherapy now with colostomy bag Plan Routine Colostomy care  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 She had MPOA paperwork started last admission but she's unsure if this was fully completed. I updated her today. Confirmed full code although she says that she wouldn't want to be maintained on life support indefinitely.]  Critical care time: 45 minutes   Erick Colace ACNP-BC [place] Pager # [phone] OR #  [phone] if no answer

## 2021-11-01 NOTE — Progress Notes (Signed)
Still very agitated in spite of rapid titration of precedex.  Plan Add phenobarb taper

## 2021-11-02 DIAGNOSIS — G9340 Encephalopathy, unspecified: Secondary | ICD-10-CM | POA: Diagnosis not present

## 2021-11-02 DIAGNOSIS — J9601 Acute respiratory failure with hypoxia: Secondary | ICD-10-CM | POA: Diagnosis not present

## 2021-11-02 DIAGNOSIS — F10931 Alcohol use, unspecified with withdrawal delirium: Secondary | ICD-10-CM

## 2021-11-02 LAB — BASIC METABOLIC PANEL
Anion gap: 11 (ref 5–15)
BUN: 24 mg/dL — ABNORMAL HIGH (ref 6–20)
CO2: 27 mmol/L (ref 22–32)
Calcium: 8.8 mg/dL — ABNORMAL LOW (ref 8.9–10.3)
Chloride: 102 mmol/L (ref 98–111)
Creatinine, Ser: 1.21 mg/dL — ABNORMAL HIGH (ref 0.44–1.00)
GFR, Estimated: 54 mL/min — ABNORMAL LOW (ref >60)
Glucose, Bld: 85 mg/dL (ref 70–99)
Potassium: 4.7 mmol/L (ref 3.5–5.1)
Sodium: 140 mmol/L (ref 135–145)

## 2021-11-02 LAB — GLUCOSE, CAPILLARY
Glucose-Capillary: 129 mg/dL — ABNORMAL HIGH (ref 70–99)
Glucose-Capillary: 100 mg/dL — ABNORMAL HIGH (ref 70–99)
Glucose-Capillary: 73 mg/dL (ref 70–99)
Glucose-Capillary: 110 mg/dL — ABNORMAL HIGH (ref 70–99)
Glucose-Capillary: 105 mg/dL — ABNORMAL HIGH (ref 70–99)
Glucose-Capillary: 114 mg/dL — ABNORMAL HIGH (ref 70–99)

## 2021-11-02 LAB — CULTURE, RESPIRATORY W GRAM STAIN: Culture: NORMAL

## 2021-11-02 MED ORDER — INSULIN ASPART 100 UNIT/ML IJ SOLN
0.0000 [IU] | INTRAMUSCULAR | Status: DC
  Administered 2021-11-04 – 2021-11-08 (×4): 2 [IU] via SUBCUTANEOUS
  Administered 2021-11-09: 3 [IU] via SUBCUTANEOUS

## 2021-11-02 MED ORDER — QUETIAPINE FUMARATE 50 MG PO TABS
50.0000 mg | ORAL_TABLET | Freq: Two times a day (BID) | ORAL | Status: DC
  Administered 2021-11-02 – 2021-11-09 (×14): 50 mg
  Filled 2021-11-02 (×14): qty 1

## 2021-11-02 MED ORDER — ORAL CARE MOUTH RINSE
15.0000 mL | Freq: Four times a day (QID) | OROMUCOSAL | Status: DC
  Administered 2021-11-02 – 2021-11-15 (×44): 15 mL via OROMUCOSAL

## 2021-11-02 MED ORDER — PHENOBARBITAL SODIUM 65 MG/ML IJ SOLN
65.0000 mg | Freq: Three times a day (TID) | INTRAMUSCULAR | Status: DC
Start: 2021-11-02 — End: 2021-11-06
  Administered 2021-11-02 – 2021-11-06 (×12): 65 mg via INTRAVENOUS
  Filled 2021-11-02 (×12): qty 1

## 2021-11-02 NOTE — Evaluation (Signed)
Clinical/Bedside Swallow Evaluation Patient Details  Name: Sherry Baker MRN: [id number] Date of Birth: [birth year]  Today's Date: 11/02/2021 Time: SLP Start Time (ACUTE ONLY): 6415 SLP Stop Time (ACUTE ONLY): 0940 SLP Time Calculation (min) (ACUTE ONLY): 18 min  Past Medical History:  Past Medical History:  Diagnosis Date   Alcohol abuse    Allergy    PCNS swelling   Arthritis    Bipolar 1 disorder ([place])    Cancer ([place]) 01/21/2017   rectal cancer   Cancer ([facility])    Cirrhosis of liver ([place])    Depression    Genetic testing 03/24/2017   Ms. Skilton underwent genetic counseling and testing for hereditary cancer syndromes on 02/17/2017. Her results were negative for mutations in all 46 genes analyzed by [facility]'s 46-gene Common Hereditary Cancers Panel. Genes analyzed include: APC, ATM, AXIN2, BARD1, BMPR1A, BRCA1, BRCA2, BRIP1, CDH1, CDKN2A, CHEK2, CTNNA1, DICER1, EPCAM, GREM1, HOXB13, KIT, MEN1, MLH1, MSH2, MSH3, MSH6, MUTYH, NBN,   Hypertension    Past Surgical History:  Past Surgical History:  Procedure Laterality Date   ABDOMINAL PERINEAL BOWEL RESECTION N/A 06/18/2017   Procedure: ABDOMINAL PERINEAL RESECTION ERAS PATHWAY;  Surgeon: Leighton Ruff, MD;  Location: [facility];  Service: General;  Laterality: N/A;   COLON SURGERY     COLONOSCOPY WITH PROPOFOL Left 01/21/2017   Procedure: COLONOSCOPY WITH PROPOFOL;  Surgeon: Otis Brace, MD;  Location: [facility];  Service: Gastroenterology;  Laterality: Left;   FRACTURE SURGERY     MANDIBLE FRACTURE SURGERY     HPI:  55 yo admitted 12/22 with AMS and encephalopathy. Pt with aspiration requiring intubation 12/28. Extubated 12/30. PMhx: ETOH cirrhosis, polysubstance abuse, bipolar, CHF, CKD, HTn, anxiety, rectal CA s/p ostomy. MBSS completed in Nov, which was WNL.    Assessment / Plan / Recommendation  Clinical Impression   Pt well known to ST service from multiple prior hospitalizations d/t ETOH and cocaine withdrawal. Pt with  ongoing withdrawal sequela including aspiration event 12/28 that resulted in intubation. Extubated 12/30. Pt remains agitated and/or sedated. She required max cues to arouse for evaluation. She has wet/congested breath quality and was unable to provide a volitional cough. She was seen with ice chips without difficulty, but had immediate cough response and gurgly vocal quality after thin liquid via spoon and SLP-moderated straw sips. Pt is restrained (unable to self feed). Given recent extubation, recent aspiration event, and performance this date, recommend remain NPO with use of Cortrak (already in place). She may have ice chips for oral comfort after oral care is completed. SLP service to return to re-assess as mentation improves.    SLP Visit Diagnosis: Dysphagia, oropharyngeal phase (R13.12)    Aspiration Risk  Severe aspiration risk;Risk for inadequate nutrition/hydration    Diet Recommendation NPO;Ice chips PRN after oral care;Alternative means - temporary        Other  Recommendations Oral Care Recommendations: Oral care QID;Oral care prior to ice chip/H20;Staff/trained caregiver to provide oral care    Recommendations for follow up therapy are one component of a multi-disciplinary discharge planning process, led by the attending physician.  Recommendations may be updated based on patient status, additional functional criteria and insurance authorization.  Follow up Recommendations   TBD     Assistance Recommended at Discharge  TBD  Functional Status Assessment Patient has had a recent decline in their functional status and demonstrates the ability to make significant improvements in function in a reasonable and predictable amount of time.  Frequency and Duration min  2x/week  2 weeks       Prognosis   Fair     Swallow Study   General Date of Onset: 10/25/21 HPI: 55 yo admitted 12/22 with AMS and encephalopathy. Pt with aspiration requiring intubation 12/28. Extubated 12/30.  PMhx: ETOH cirrhosis, polysubstance abuse, bipolar, CHF, CKD, HTn, anxiety, rectal CA s/p ostomy. MBSS completed in Nov, which was WNL. Type of Study: Bedside Swallow Evaluation Previous Swallow Assessment: MBSS completed 6 weeks ago (Nov 2022) with normal swallow function Diet Prior to this Study: NPO;NG Tube Respiratory Status: Room air History of Recent Intubation: No Behavior/Cognition: Lethargic/Drowsy;Requires cueing;Confused;Agitated Oral Cavity Assessment: Within Functional Limits Oral Care Completed by SLP: No Oral Cavity - Dentition: Poor condition;Missing dentition Vision: Functional for self-feeding Patient Positioning: Upright in bed Baseline Vocal Quality: Wet;Low vocal intensity Volitional Cough: Wet;Congested Volitional Swallow: Unable to elicit    Oral/Motor/Sensory Function Overall Oral Motor/Sensory Function: Generalized oral weakness   Ice Chips Ice chips: Within functional limits Presentation: Spoon   Thin Liquid Thin Liquid: Impaired Presentation: Spoon;Straw Pharyngeal  Phase Impairments: Throat Clearing - Immediate;Cough - Immediate;Wet Vocal Quality    Sherry Baker, M.S., CCC-SLP Speech-Language Pathologist Acute Rehabilitation Services Pager: [place] 11/02/2021,9:51 AM

## 2021-11-02 NOTE — Progress Notes (Addendum)
NAME:  MARCIANNE OZBUN, MRN:  [id number], DOB:  1966/08/29, LOS: 8 ADMISSION DATE:  10/24/2021, CONSULTATION DATE: 10/25/2021 REFERRING MD:  Terrilee Croak, MD , CHIEF COMPLAINT: Alcohol withdrawal  History of Present Illness:  55 year old female with alcohol dependence, alcoholic liver cirrhosis and polysubstance abuse was brought into the emergency department 12/22 with altered mental status.  She was found to be agitated, screaming, with work-up positive for cocaine and cannabis on UDS, UA was positive for probable UTI.  She was admitted to hospitalist service for further evaluation but she started requiring more Ativan for delirium, remained encephalopathic, screaming.  PCCM was consulted for help with evaluation and management.  Pertinent  Medical History  ETOH Abuse  Bipolar Disorder  Cirrhosis of the Liver  Depression  HTN  Arthritis  Rectal Cancer   Significant Hospital Events: Including procedures, antibiotic start and stop dates in addition to other pertinent events   12/23 admitted, started on precedex infusion, librium taper, CIWA based ativan 12/28 aspirated, hypoxic, intubated, noted to have glottic edema. Unasyn started. Sputum sent.  12/29 got solumedrol for glottal edema  12/30 cuff leak present but weak. Attempting SBT. Extubated   Interim History / Subjective:  Afebrile  I/O 1.8L UOP, -123ml in last 24 hours On RA  SLP evaluation > NPO, ice chips PRN Tolerated extubation 12/30  Glucose range 100-129   Objective   Blood pressure (!) 152/76, pulse 60, temperature 97.6 F (36.4 C), temperature source Oral, resp. rate 16, height 5\' 2"  (1.575 m), weight 46.7 kg, SpO2 99 %.        Intake/Output Summary (Last 24 hours) at 11/02/2021 0955 Last data filed at 11/02/2021 0600 Gross per 24 hour  Intake 1701.01 ml  Output 1700 ml  Net 1.01 ml   Filed Weights   10/31/21 0500 11/01/21 0500 11/02/21 0500  Weight: 47.5 kg 47.7 kg 46.7 kg    Examination: General:  chronically ill appearing adult female sitting up in bed in NAD HEENT: MM pink/moist, anicteric Neuro: Awakens to voice, intermittently tearful, MAE CV: s1s2 RRR, SB to SR, no m/r/g PULM: non-labored at rest, lungs bilaterally with good air entry, occasional rhonchi GI: soft, bsx4 active, LLQ ostomy bag in place with liquid stool  Extremities: warm/dry, no edema  Skin: no rashes or lesions  Resolved Hospital Problem list   Hyperkalemia   Proteus UTI - completed 5 days of ceftriaxone    Assessment & Plan:   Acute Toxic-Metabolic Encephalopathy Severe ETOH Withdrawal Polysubstance abuse -librium 25mg  TID  -reduce phenobarbital to 65 mg TID, leave other active agents in place for now -continue seroquel -CIWA protocol with PRN ativan  -discontinue precedex from [employer]  -continue clonidine, would favor stopping this first as we reduce medications -folate, thiamine, MVI  Acute respiratory failure requiring MV secondary to Aspiration PNA vs Pneumonitis Aspirated due to dysphagia. Glottic edema at time of intubation.  PCT negative.  Had weak cuff leak at time of extubation. -pulmonary hygiene -IS, mobilize  -D4/5 Unasyn  -follow up tracheal aspirate, note GPC's on GS -follow intermittent CXR  Dysphagia Failed SLP evalaution 12/31 -appreciate SLP evaluation > rec's 12/31 for NPO with ice chips PRN  -continue Cortrak with TF for nutritional support   CKD IIIa -Trend BMP / urinary output -Replace electrolytes as indicated -Avoid nephrotoxic agents, ensure adequate renal perfusion -assess BMP 12/31   ETOH Cirrhosis -follow ammonia trend -cessation counseling when able   Chronic dCHF  Uncontrolled HTN -on clonidine for now with encephalopathy  -  hold home norvasc, coreg  -PRN hydralazine   Hyperglycemia Suspect exacerbated by steroids -SSI, change to moderate scale   History of rectal cancer, status post chemotherapy now with colostomy bag -ostomy care per protocol    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 She had MPOA paperwork started last admission but she's unsure if this was fully completed. Updated 12/30. Confirmed full code although she says that she wouldn't want to be maintained on life support indefinitely.  Critical care time: 56 minutes     Noe Gens, MSN, APRN, NP-C, AGACNP-BC  Pulmonary & Critical Care 11/02/2021, 10:00 AM   Please see [email or web address] for pager details.   From 7A-7P if no response, please call [phone] After hours, please call ELink [phone]

## 2021-11-03 ENCOUNTER — Inpatient Hospital Stay (HOSPITAL_COMMUNITY): Payer: Medicaid Other

## 2021-11-03 DIAGNOSIS — F10939 Alcohol use, unspecified with withdrawal, unspecified: Secondary | ICD-10-CM | POA: Diagnosis not present

## 2021-11-03 DIAGNOSIS — I10 Essential (primary) hypertension: Secondary | ICD-10-CM | POA: Diagnosis not present

## 2021-11-03 DIAGNOSIS — G9340 Encephalopathy, unspecified: Secondary | ICD-10-CM | POA: Diagnosis not present

## 2021-11-03 DIAGNOSIS — R1310 Dysphagia, unspecified: Secondary | ICD-10-CM

## 2021-11-03 LAB — BASIC METABOLIC PANEL
Anion gap: 11 (ref 5–15)
BUN: 21 mg/dL — ABNORMAL HIGH (ref 6–20)
CO2: 26 mmol/L (ref 22–32)
Calcium: 9.1 mg/dL (ref 8.9–10.3)
Chloride: 101 mmol/L (ref 98–111)
Creatinine, Ser: 1.3 mg/dL — ABNORMAL HIGH (ref 0.44–1.00)
GFR, Estimated: 49 mL/min — ABNORMAL LOW (ref >60)
Glucose, Bld: 86 mg/dL (ref 70–99)
Potassium: 4.4 mmol/L (ref 3.5–5.1)
Sodium: 138 mmol/L (ref 135–145)

## 2021-11-03 LAB — GLUCOSE, CAPILLARY
Glucose-Capillary: 110 mg/dL — ABNORMAL HIGH (ref 70–99)
Glucose-Capillary: 118 mg/dL — ABNORMAL HIGH (ref 70–99)
Glucose-Capillary: 79 mg/dL (ref 70–99)
Glucose-Capillary: 79 mg/dL (ref 70–99)
Glucose-Capillary: 97 mg/dL (ref 70–99)
Glucose-Capillary: 99 mg/dL (ref 70–99)

## 2021-11-03 MED ORDER — AMLODIPINE BESYLATE 10 MG PO TABS
10.0000 mg | ORAL_TABLET | Freq: Every day | ORAL | Status: DC
  Administered 2021-11-03 – 2021-11-11 (×9): 10 mg
  Filled 2021-11-03 (×9): qty 1

## 2021-11-03 NOTE — Progress Notes (Signed)
Aunt (Clara) called Mult family members have called for updates including a brother by the name of Octavia Bruckner but aunt reports George has no brothers.  Password has been set to "Rose" to minimize any further interruptions and giving out information to unnecessary people.  Education has also been given for family members to update one another to avoid interruption in pt care... Clara reports she will let Crystal, who is NOK, know of the recent changes.

## 2021-11-03 NOTE — Progress Notes (Signed)
NAME:  Sherry Baker, MRN:  [id number], DOB:  1966-10-17, LOS: 9 ADMISSION DATE:  10/24/2021, CONSULTATION DATE: 10/25/2021 REFERRING MD:  Terrilee Croak, MD , CHIEF COMPLAINT: Alcohol withdrawal  History of Present Illness:  56 year old female with alcohol dependence, alcoholic liver cirrhosis and polysubstance abuse was brought into the emergency department 12/22 with altered mental status.  She was found to be agitated, screaming, with work-up positive for cocaine and cannabis on UDS, UA was positive for probable UTI.  She was admitted to hospitalist service for further evaluation but she started requiring more Ativan for delirium, remained encephalopathic, screaming.  PCCM was consulted for help with evaluation and management.  Pertinent  Medical History  ETOH Abuse  Bipolar Disorder  Cirrhosis of the Liver  Depression  HTN  Arthritis  Rectal Cancer   Significant Hospital Events: Including procedures, antibiotic start and stop dates in addition to other pertinent events   12/23 admitted, started on precedex infusion, librium taper, CIWA based ativan 12/28 aspirated, hypoxic, intubated, noted to have glottic edema. Unasyn started. Sputum sent.  12/29 got solumedrol for glottal edema  12/30 cuff leak present but weak. Attempting SBT. Extubated   Interim History / Subjective:   She denies complaints. Less agitated today.  Objective   Blood pressure (!) 137/95, pulse 82, temperature 97.7 F (36.5 C), temperature source Oral, resp. rate 19, height 5\' 2"  (1.575 m), weight 45.9 kg, SpO2 98 %.        Intake/Output Summary (Last 24 hours) at 11/03/2021 0177 Last data filed at 11/03/2021 0800 Gross per 24 hour  Intake 1415 ml  Output 600 ml  Net 815 ml    Filed Weights   11/01/21 0500 11/02/21 0500 11/03/21 0500  Weight: 47.7 kg 46.7 kg 45.9 kg    Examination: General: chronically ill appearing woman lying in bed no acute distress HEENT:[place]/AT, core track in place, eyes  anicteric Neuro: RASS 0, tracks, answer some short questions but not conversational.  Globally weak. CV: S1-S2, regular rate and rhythm PULM: Faint rhonchi, no tachypnea or accessory muscle use. GI: Soft, nontender, nondistended.  Ostomy.   Extremities: Minimal muscle mass, no peripheral edema Skin: Warm, dry  WBC 5.2 H/H 10/32.7 Platelets 194 BUN 21 Cr 1.3 Respiratory culture normal flora  Resolved Hospital Problem list   Hyperkalemia   Proteus UTI - completed 5 days of ceftriaxone    Assessment & Plan:   Acute Toxic-Metabolic Encephalopathy Severe ETOH Withdrawal Polysubstance abuse -librium 25mg  TID since 12/26-okay to continue -Continue reduced dose phenobarbital to 65 mg TID; was too sedated on higher dose -continue seroquel twice daily -CIWA protocol -Continue clonidine. -Continue supplemental vitamins  Acute respiratory failure requiring MV secondary to Aspiration PNA vs Pneumonitis Aspirated due to dysphagia. Glottic edema at time of intubation.  PCT negative.  Had weak cuff leak at time of extubation, but no postextubation stridor. -Pulmonary hygiene, mobilize as able -Complete 5 days of Unasyn  Dysphagia Failed SLP evalaution 12/31 -appreciate SLP evaluation > rec's 12/31 for NPO with ice chips PRN  -Cortrack &  tube feeds  CKD IIIa -Continue to monitor renal function - Strict I's/O - Renally dose meds and avoid nephrotoxic meds  ETOH Cirrhosis Yes remained within normal limits -Need to counsel importance of avoiding alcohol when able to participate  Chronic HFpEF HTN, better controlled -Continue clonidine while this is part of her regimen for encephalopathy, can resume home amlodipine today.  Continue to hold Coreg.  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 TRH to assume care tomorrow.   Best Practice (right click and "Reselect all SmartList Selections" daily)  Diet/type: tubefeeds DVT prophylaxis: prophylactic heparin  GI prophylaxis: PPI Lines: N/A Foley:  N/A Code Status:  full code Last date of multidisciplinary goals of care discussion: [name] is next of kin [phone]. She had MPOA paperwork started last admission but she's unsure if this was fully completed. Updated 12/30. Confirmed full code although she says that she wouldn't want to be maintained on life support indefinitely.  Critical care time: 32 minutes

## 2021-11-04 DIAGNOSIS — G9340 Encephalopathy, unspecified: Secondary | ICD-10-CM | POA: Diagnosis not present

## 2021-11-04 LAB — GLUCOSE, CAPILLARY
Glucose-Capillary: 114 mg/dL — ABNORMAL HIGH (ref 70–99)
Glucose-Capillary: 116 mg/dL — ABNORMAL HIGH (ref 70–99)
Glucose-Capillary: 111 mg/dL — ABNORMAL HIGH (ref 70–99)
Glucose-Capillary: 94 mg/dL (ref 70–99)
Glucose-Capillary: 100 mg/dL — ABNORMAL HIGH (ref 70–99)
Glucose-Capillary: 131 mg/dL — ABNORMAL HIGH (ref 70–99)

## 2021-11-04 LAB — BASIC METABOLIC PANEL
Anion gap: 9 (ref 5–15)
BUN: 24 mg/dL — ABNORMAL HIGH (ref 6–20)
CO2: 27 mmol/L (ref 22–32)
Calcium: 9.1 mg/dL (ref 8.9–10.3)
Chloride: 101 mmol/L (ref 98–111)
Creatinine, Ser: 1.44 mg/dL — ABNORMAL HIGH (ref 0.44–1.00)
GFR, Estimated: 43 mL/min — ABNORMAL LOW (ref >60)
Glucose, Bld: 103 mg/dL — ABNORMAL HIGH (ref 70–99)
Potassium: 4.7 mmol/L (ref 3.5–5.1)
Sodium: 137 mmol/L (ref 135–145)

## 2021-11-04 LAB — CBC WITH DIFFERENTIAL/PLATELET
Abs Immature Granulocytes: 0 10*3/uL (ref 0.00–0.07)
Basophils Absolute: 0 10*3/uL (ref 0.0–0.1)
Basophils Relative: 0 %
Eosinophils Absolute: 0.3 10*3/uL (ref 0.0–0.5)
Eosinophils Relative: 4 %
HCT: 34.4 % — ABNORMAL LOW (ref 36.0–46.0)
Hemoglobin: 11 g/dL — ABNORMAL LOW (ref 12.0–15.0)
Lymphocytes Relative: 22 %
Lymphs Abs: 1.4 10*3/uL (ref 0.7–4.0)
MCH: 28.7 pg (ref 26.0–34.0)
MCHC: 32 g/dL (ref 30.0–36.0)
MCV: 89.8 fL (ref 80.0–100.0)
Monocytes Absolute: 0.3 10*3/uL (ref 0.1–1.0)
Monocytes Relative: 4 %
Neutro Abs: 4.4 10*3/uL (ref 1.7–7.7)
Neutrophils Relative %: 70 %
Platelets: 305 10*3/uL (ref 150–400)
RBC: 3.83 MIL/uL — ABNORMAL LOW (ref 3.87–5.11)
RDW: 16.4 % — ABNORMAL HIGH (ref 11.5–15.5)
WBC: 6.3 10*3/uL (ref 4.0–10.5)
nRBC: 0 % (ref 0.0–0.2)
nRBC: 0 /100 WBC

## 2021-11-04 LAB — AMMONIA: Ammonia: 20 umol/L (ref 9–35)

## 2021-11-04 MED ORDER — LORAZEPAM 2 MG/ML IJ SOLN
1.0000 mg | INTRAMUSCULAR | Status: DC | PRN
  Administered 2021-11-04 – 2021-11-15 (×19): 1 mg via INTRAVENOUS
  Filled 2021-11-04 (×20): qty 1

## 2021-11-04 MED ORDER — CHLORDIAZEPOXIDE HCL 25 MG PO CAPS
25.0000 mg | ORAL_CAPSULE | Freq: Two times a day (BID) | ORAL | Status: DC
  Administered 2021-11-04 – 2021-11-11 (×15): 25 mg
  Filled 2021-11-04 (×15): qty 1

## 2021-11-04 MED ORDER — CLONIDINE HCL 0.1 MG PO TABS
0.1000 mg | ORAL_TABLET | Freq: Two times a day (BID) | ORAL | Status: DC
  Administered 2021-11-04 – 2021-11-06 (×5): 0.1 mg via NASOGASTRIC
  Filled 2021-11-04 (×5): qty 1

## 2021-11-04 NOTE — Progress Notes (Signed)
PROGRESS NOTE    Sherry Baker  RSW:[id number] DOB: [birth year] DOA: 10/24/2021 PCP: Pcp, No   Brief Narrative: 56 year old with past medical history significant for alcohol dependence, alcoholic liver cirrhosis and polysubstance abuse was brought into the emergency department 12/22 with altered mental status.  She was found to be agitated, screaming with work-up positive for cocaine and cannabis on UDS, UA was positive for probably UTI.  She was admitted to hospitalist service for further evaluation, but she still required more Ativan for delirium remained encephalopathic.  PCCM was consulted.  Patient was a started on Precedex infusion, Librium taper, CIWA Wace Ativan.  On 12/28 patient aspirated, became hypoxic and was intubated.  She was noted to have glottic edema.  Unasyn was started.  She received Solu-Medrol for glottic edema.  Subsequently she was extubated on 12/30.  Assessment & Plan:   Principal Problem:   Acute encephalopathy Active Problems:   Substance abuse (HCC)   Depression   Thrombocytopenia (HCC)   Pressure injury of skin   Alcohol withdrawal (HCC)   Alcohol withdrawal delirium (HCC)   Acute respiratory failure with hypoxia (HCC)   Alcohol use disorder, severe, dependence ([place])   1-Acute Toxic Metabolic Encephalopathy Severe EtOH withdrawal. Polysubstance abuse. On Librium, Phenobarbital for alcohol withdrawal.  Plan to change librium to BID today. Continue taper.  Ammonia Normal.  Will need to wean off phenobarbital.  On Seroquel BID.  Will need to be wean off clonidine. Change clonidine to BID today.   2-Acute Respiratory Failure requiring MD secondary to aspiration pneumonia versus pneumonitis: -Aspirated due to dysphagia.  Glottic edema at the time of intubation. -Continue with pulmonary hygiene, mobilize as able -Complete  5 days of Unasyn.  Needs aggressive pulmonary toilet. Flutter valve ordered.   3-Dysphagia/ Severe malnutrition.  Continue with  tube feeding  Speech following, still recommending NPO.   4-CKD stage III A Monitor renal function.   EtOH cirrhosis Ammonia normal.  Continue with lactulose.   Chronic HF PEF Hypertension -PRN Hydralazine.  Resume Norvasc.  Holding coreg.   Hyperglycemia Related to Steroids.  SSI.   History of Rectal cancer s/p chemotherapy, status post colostomy bag Ostomy care per protocol.   Pressure Injury  chronic POA stage III cocci Wound care management.      Pressure Injury 10/25/21 Sacrum Medial Stage 3 -  Full thickness tissue loss. Subcutaneous fat may be visible but bone, tendon or muscle are NOT exposed. (Active)  10/25/21 1421  Location: Sacrum  Location Orientation: Medial  Staging: Stage 3 -  Full thickness tissue loss. Subcutaneous fat may be visible but bone, tendon or muscle are NOT exposed.  Wound Description (Comments):   Present on Admission: Yes     Nutrition Problem: Severe Malnutrition Etiology: chronic illness (cirrhosis, polysubstance abuse)    Signs/Symptoms: severe muscle depletion, severe fat depletion    Interventions: Tube feeding, MVI  Estimated body mass index is 19.31 kg/m as calculated from the following:   Height as of this encounter: '5\' 2"'  (1.575 m).   Weight as of this encounter: 47.9 kg.   DVT prophylaxis: Heparin  Code Status: Full code Family Communication: no family at bedside Disposition Plan:  Status is: Inpatient  Remains inpatient appropriate because: remain for encephalopathy requiring tube feeding         Consultants:  CCM   Procedures:  Intubation 12/28---extubation 12/30  Antimicrobials:    Subjective: She is alert, speech difficult to understand, her speech is too soft.   Objective:  Vitals:   11/04/21 0340 11/04/21 0400 11/04/21 0500 11/04/21 0600  BP:  138/76 (!) 124/95 (!) 134/99  Pulse: 78 94 82 90  Resp: (!) 23 (!) 23 (!) 28 16  Temp: 98.2 F (36.8 C)     TempSrc: Axillary     SpO2:  100% 100% 100% 99%  Weight:   47.9 kg   Height:        Intake/Output Summary (Last 24 hours) at 11/04/2021 0724 Last data filed at 11/04/2021 0600 Gross per 24 hour  Intake 1470 ml  Output 950 ml  Net 520 ml   Filed Weights   11/02/21 0500 11/03/21 0500 11/04/21 0500  Weight: 46.7 kg 45.9 kg 47.9 kg    Examination:  General exam: chronic ill appearing.   Respiratory system: Clear to auscultation. Respiratory effort normal. Cardiovascular system: S1 & S2 heard, RRR.  Gastrointestinal system: Abdomen is nondistended, soft and nontender. No organomegaly or masses felt. Normal bowel sounds heard. Central nervous system: Alert  Extremities: trace edema     Data Reviewed: I have personally reviewed following labs and imaging studies  CBC: Recent Labs  Lab 10/29/21 0727 10/30/21 0431 11/01/21 0127 11/04/21 0206  WBC 4.2 5.3 5.2 6.3  NEUTROABS  --   --  4.4 4.4  HGB 11.5* 10.3* 10.0* 11.0*  HCT 35.3* 32.7* 32.7* 34.4*  MCV 88.7 89.8 92.1 89.8  PLT 155 171 194 546   Basic Metabolic Panel: Recent Labs  Lab 10/28/21 1644 10/28/21 1644 10/29/21 0727 10/29/21 1421 10/30/21 0431 10/30/21 1617 10/31/21 0444 11/01/21 0127 11/02/21 1234 11/03/21 0159 11/04/21 0206  NA  --    < > 142  --  139  --  136 137 140 138 137  K  --    < > 2.8*  --  3.9  --  3.5 5.1 4.7 4.4 4.7  CL  --    < > 111  --  110  --  108 106 102 101 101  CO2  --    < > 19*  --  20*  --  '22 24 27 26 27  ' GLUCOSE  --    < > 121*  --  126*  --  128* 156* 85 86 103*  BUN  --    < > 14  --  13  --  14 19 24* 21* 24*  CREATININE  --    < > 1.31*  --  1.25*  --  1.40* 1.47* 1.21* 1.30* 1.44*  CALCIUM  --    < > 8.9  --  8.9  --  8.6* 8.8* 8.8* 9.1 9.1  MG 1.9  --  1.9 2.2 1.9 1.8  --  1.9  --   --   --   PHOS 4.8*  --  4.4 4.0 3.5 3.4  --   --   --   --   --    < > = values in this interval not displayed.   GFR: Estimated Creatinine Clearance: 33.8 mL/min (A) (by C-G formula based on SCr of 1.44 mg/dL  (H)). Liver Function Tests: Recent Labs  Lab 11/01/21 0127  AST 16  ALT 10  ALKPHOS 53  BILITOT 0.3  PROT 6.4*  ALBUMIN 2.2*   No results for input(s): LIPASE, AMYLASE in the last 168 hours. Recent Labs  Lab 11/01/21 0844  AMMONIA 23   Coagulation Profile: No results for input(s): INR, PROTIME in the last 168 hours. Cardiac Enzymes: No results for input(s):  CKTOTAL, CKMB, CKMBINDEX, TROPONINI in the last 168 hours. BNP (last 3 results) No results for input(s): PROBNP in the last 8760 hours. HbA1C: No results for input(s): HGBA1C in the last 72 hours. CBG: Recent Labs  Lab 11/03/21 1111 11/03/21 1504 11/03/21 1959 11/03/21 2350 11/04/21 0334  GLUCAP 110* 99 97 118* 114*   Lipid Profile: No results for input(s): CHOL, HDL, LDLCALC, TRIG, CHOLHDL, LDLDIRECT in the last 72 hours. Thyroid Function Tests: No results for input(s): TSH, T4TOTAL, FREET4, T3FREE, THYROIDAB in the last 72 hours. Anemia Panel: No results for input(s): VITAMINB12, FOLATE, FERRITIN, TIBC, IRON, RETICCTPCT in the last 72 hours. Sepsis Labs: Recent Labs  Lab 10/30/21 1916 10/31/21 0444 11/01/21 0127  PROCALCITON <0.10 <0.10 <0.10    Recent Results (from the past 240 hour(s))  MRSA Next Gen by PCR, Nasal     Status: Abnormal   Collection Time: 10/25/21  3:17 PM   Specimen: Nasal Mucosa; Nasal Swab  Result Value Ref Range Status   MRSA by PCR Next Gen DETECTED (A) NOT DETECTED Final    Comment: CRITICAL RESULT CALLED TO, READ BACK BY AND VERIFIED WITH:  Pilar Plate, RN 10/25/21 AT 44 BY P.WHITE, MT. (NOTE) The GeneXpert MRSA Assay (FDA approved for NASAL specimens only), is one component of a comprehensive MRSA colonization surveillance program. It is not intended to diagnose MRSA infection nor to guide or monitor treatment for MRSA infections. Test performance is not FDA approved in patients less than 46 years old. Performed at [facility], [place] [address] [place],  [place] [address]   Culture, Respiratory w Gram Stain     Status: None   Collection Time: 10/31/21 11:03 AM   Specimen: Tracheal Aspirate; Respiratory  Result Value Ref Range Status   Specimen Description TRACHEAL ASPIRATE  Final   Special Requests NONE  Final   Gram Stain   Final    RARE SQUAMOUS EPITHELIAL CELLS PRESENT ABUNDANT WBC PRESENT,BOTH PMN AND MONONUCLEAR FEW GRAM POSITIVE COCCI    Culture   Final    RARE Consistent with normal respiratory flora. No Pseudomonas species isolated Performed at [facility] [address] [place], [place] [address]    Report Status 11/02/2021 FINAL  Final         Radiology Studies: DG CHEST PORT 1 VIEW  Result Date: 11/03/2021 CLINICAL DATA:  Encounter for aspiration pneumonitis. EXAM: PORTABLE CHEST 1 VIEW COMPARISON:  11/01/2021 FINDINGS: Small densities at the medial right lung base are minimally changed. Otherwise, the lungs are clear. Feeding tube extends into the abdomen but the tip is beyond the image. Heart and mediastinum are within normal limits. Negative for a pneumothorax. Trachea is midline. IMPRESSION: 1. No acute chest findings. Small densities at the medial right lung base are nonspecific. Electronically Signed   By: Markus Daft M.D.   On: 11/03/2021 08:42        Scheduled Meds:  amLODipine  10 mg Per Tube Daily   chlordiazePOXIDE  25 mg Per Tube TID   chlorhexidine gluconate (MEDLINE KIT)  15 mL Mouth Rinse BID   Chlorhexidine Gluconate Cloth  6 each Topical Daily   cloNIDine  0.1 mg Per NG tube TID   docusate  100 mg Per Tube BID   folic acid  1 mg Per Tube Daily   free water  150 mL Per Tube Q4H   heparin  5,000 Units Subcutaneous Q8H   insulin aspart  0-15 Units Subcutaneous Q4H   lactulose  10 g Per Tube BID   mouth rinse  15 mL Mouth Rinse QID   multivitamin with minerals  1 tablet Per Tube Daily   pantoprazole sodium  40 mg Per Tube BID   PHENObarbital  65 mg Intravenous TID   polyethylene glycol  17 g  Per Tube Daily   QUEtiapine  50 mg Per Tube BID   thiamine  100 mg Per Tube Daily   Or   thiamine  100 mg Intravenous Daily   Continuous Infusions:  sodium chloride 10 mL/hr at 10/29/21 1800   feeding supplement (OSMOLITE 1.5 CAL) 1,000 mL (11/03/21 2300)     LOS: 10 days    Time spent: 35 minutes.     Elmarie Shiley, MD [facility]   If 7PM-7AM, please contact night-coverage [email or web address]  11/04/2021, 7:24 AM

## 2021-11-04 NOTE — Progress Notes (Signed)
Speech Language Pathology Treatment: Dysphagia  Patient Details Name: Sherry Baker MRN: [id number] DOB: 09/25/1966 Today's Date: 11/04/2021 Time: 6568-1275 SLP Time Calculation (min) (ACUTE ONLY): 11 min  Assessment / Plan / Recommendation Clinical Impression  Pt shows no real change in swallow function since initial evaluation 12/31.  She is confused and agitated; remains in restraints.  Baseline voice is very wet - she does not appear to spontaneously swallow secretions at a typical frequency, leading to accumulation, eventual coughing, and expectoration of large quantity of frothy saliva. She accepted several ice chips, however, she continued to cough and expectorate and required max multimodal cues for basic attention. She is not ready for POs. SLP will continue to follow along.   HPI HPI: 56 yo admitted 12/22 with AMS and encephalopathy. Pt with aspiration requiring intubation 12/28. Extubated 12/30. Notes indicate she had glottic edema at time of intubation. PMhx: ETOH cirrhosis, polysubstance abuse, bipolar, CHF, CKD, HTn, anxiety, rectal CA s/p ostomy. MBSS completed in Nov, which was WNL.      SLP Plan  Continue with current plan of care      Recommendations for follow up therapy are one component of a multi-disciplinary discharge planning process, led by the attending physician.  Recommendations may be updated based on patient status, additional functional criteria and insurance authorization.    Recommendations  Diet recommendations: NPO with cortrak                Oral Care Recommendations: Oral care QID;Oral care prior to ice chip/H20;Staff/trained caregiver to provide oral care Follow Up Recommendations: Other (comment) (tba) SLP Visit Diagnosis: Dysphagia, oropharyngeal phase (R13.12) Plan: Continue with current plan of care         Dessie Tatem L. Tivis Ringer, [place] Office number [phone] Pager [phone]   Juan Quam  Laurice  11/04/2021, 9:52 AM

## 2021-11-04 NOTE — Evaluation (Signed)
Occupational Therapy Evaluation Patient Details Name: Sherry Baker MRN: [id number] DOB: Dec 18, 1965 Today's Date: 11/04/2021   History of Present Illness 56 yo admitted 12/22 with AMS and encephalopathy. Pt with aspiration requiring intubation 12/28. Extubated 12/30. PMhx: ETOH cirrhosis, polysubstance abuse, bipolar, CHF, CKD, HTn, anxiety, rectal CA s/p ostomy   Clinical Impression   Patient admitted for above and presenting with impaired cognition, decreased activity tolerance, impaired balance and generalized weakness. She verbalizes her name and birthday month, grimaces and voices "headache" during session.  Follows simple commands with Ues, but unable to use functionally at this time during ADLs.  Overall total assist for ADLs, total assist +2 to reposition in bed and unsafe to attempt EOB with 1+ at this time (sliding forward).  Will follow acutely to optimize independence and decrease burden of care.       Recommendations for follow up therapy are one component of a multi-disciplinary discharge planning process, led by the attending physician.  Recommendations may be updated based on patient status, additional functional criteria and insurance authorization.   Follow Up Recommendations  Skilled nursing-short term rehab (<3 hours/day)    Assistance Recommended at Discharge Frequent or constant Supervision/Assistance  Functional Status Assessment  Patient has had a recent decline in their functional status and demonstrates the ability to make significant improvements in function in a reasonable and predictable amount of time.  Equipment Recommendations  Other (comment) (TBD)    Recommendations for Other Services       Precautions / Restrictions Precautions Precautions: Fall Precaution Comments: wrist restraints, mitts, cortrack, colostomy Restrictions Weight Bearing Restrictions: No      Mobility Bed Mobility Overal bed mobility: Needs Assistance             General  bed mobility comments: attempted EOB but pt sliding towards edge and not following commands; repositioned with total assist +2    Transfers                          Balance                                           ADL either performed or assessed with clinical judgement   ADL                                               Vision   Vision Assessment?: No apparent visual deficits     Perception     Praxis      Pertinent Vitals/Pain Pain Assessment: Faces Faces Pain Scale: Hurts whole lot Pain Location: HA Pain Descriptors / Indicators: Headache Pain Intervention(s): Other (comment) (RN notified)     Hand Dominance     Extremity/Trunk Assessment Upper Extremity Assessment Upper Extremity Assessment: Generalized weakness   Lower Extremity Assessment Lower Extremity Assessment: Defer to PT evaluation       Communication Communication Communication: Expressive difficulties (muffled speech, lethargic)   Cognition Arousal/Alertness: Lethargic Behavior During Therapy: Flat affect Overall Cognitive Status: Difficult to assess                                 General Comments: pt able to verbalize name,  birthday month.  Follows simple commands to squeeze hand, raise arm, look at therapist but otherwise not engaging in ADLs.     General Comments  VSS on RA    Exercises     Shoulder Instructions      Home Living Family/patient expects to be discharged to:: Unsure                                 Additional Comments: No family/caregivers present to provide any reliable information; pt unable to answer questions regarding home or PLOF      Prior Functioning/Environment Prior Level of Function : Patient poor historian/Family not available                        OT Problem List: Decreased strength;Decreased activity tolerance;Impaired balance (sitting and/or standing);Decreased  coordination;Decreased cognition;Decreased safety awareness;Decreased knowledge of use of DME or AE;Decreased knowledge of precautions;Cardiopulmonary status limiting activity      OT Treatment/Interventions: Self-care/ADL training;Therapeutic exercise;DME and/or AE instruction;Therapeutic activities;Patient/family education;Balance training    OT Goals(Current goals can be found in the care plan section) Acute Rehab OT Goals Patient Stated Goal: none OT Goal Formulation: Patient unable to participate in goal setting Time For Goal Achievement: 11/18/21 Potential to Achieve Goals: Fair  OT Frequency: Min 2X/week   Barriers to D/C:            Co-evaluation              AM-PAC OT "6 Clicks" Daily Activity     Outcome Measure Help from another person eating meals?: Total Help from another person taking care of personal grooming?: Total Help from another person toileting, which includes using toliet, bedpan, or urinal?: Total Help from another person bathing (including washing, rinsing, drying)?: Total Help from another person to put on and taking off regular upper body clothing?: Total Help from another person to put on and taking off regular lower body clothing?: Total 6 Click Score: 6   End of Session Nurse Communication: Mobility status;Other (comment) (lethargy)  Activity Tolerance: Patient limited by lethargy Patient left: in bed;with call bell/phone within reach;with bed alarm set;with restraints reapplied  OT Visit Diagnosis: Other abnormalities of gait and mobility (R26.89);Muscle weakness (generalized) (M62.81);Other symptoms and signs involving cognitive function                Time: 7867-5449 OT Time Calculation (min): 19 min Charges:  OT General Charges $OT Visit: 1 Visit OT Evaluation $OT Eval Moderate Complexity: 1 Mod  Jolaine Artist, OT Acute Rehabilitation Services Pager [phone] Office [phone]   Delight Stare 11/04/2021, 11:58 AM

## 2021-11-04 NOTE — Progress Notes (Signed)
Nutrition Follow-up  DOCUMENTATION CODES:   Underweight, Severe malnutrition in context of chronic illness  INTERVENTION:   Continue tube feeds via Cortrak: - Osmolite 1.5 @ 55 ml/hr (1320 ml/day) - Free water flushes per MD, currently 150 ml q 4 hours   Tube feeding regimen provides 1980 kcal, 83 grams of protein, and 1006 ml of H2O.  Total free water with flushes: 1906 ml   - Continue MVI with minerals daily per tube  NUTRITION DIAGNOSIS:   Severe Malnutrition related to chronic illness (cirrhosis, polysubstance abuse) as evidenced by severe muscle depletion, severe fat depletion.  Ongoing, being addressed via TF  GOAL:   Patient will meet greater than or equal to 90% of their needs  Met via TF  MONITOR:   Diet advancement, Labs, Weight trends, TF tolerance, Skin, I & O's  REASON FOR ASSESSMENT:   Consult Enteral/tube feeding initiation and management  ASSESSMENT:   56 yo female with a PMH of alcohol dependence, alcoholic liver cirrhosis, rectal cancer s/p colostomy, and polysubstance abuse who presents with altered mental status. She was found to be agitated, screaming, work-up UDS positive for cocaine and cannabis, UA was positive for probable UTI.  12/24 - NG tube placed 12/28 - Cortrak placed (tip gastric), aspiration event, intubated 12/30 - extubated  Plan to discuss pt with RN and during ICU rounds. Pt has been evaluated by SLP who is recommending pt remain NPO. Pt not in room at time of RD visit but tube feeding pump programmed to infuse Osmolite 1.5 at goal rate of 55 ml/hr. Will continue with current tube feeding regimen.  Admit weight: 45 kg Current weight: 47.9 kg  Current TF: Osmolite 1.5 @ 55 ml/hr, free water flushes 150 ml q 4 hours  Medications reviewed and include: colace, folic acid, SSI q 4 hours, lactulose, MVI with minerals daily, protonix, IV phenobarbital, miralax, thiamine  Labs reviewed: BUN 24, creatinine 1.44 CBG's: 97-118 x 24  hours  UOP: 300 ml x 24 hours Colostomy: 650 ml x 24 hours I/O's: +3.2 L since admit  Diet Order:   Diet Order             Diet NPO time specified Except for: Sips with Meds  Diet effective now                   EDUCATION NEEDS:   Not appropriate for education at this time  Skin:  Skin Assessment: Skin Integrity Issues: Stage III: Sacrum  Last BM:  11/04/21 colostomy  Height:   Ht Readings from Last 1 Encounters:  10/25/21 '5\' 2"'  (1.575 m)    Weight:   Wt Readings from Last 1 Encounters:  11/04/21 47.9 kg    BMI:  Body mass index is 19.31 kg/m.  Estimated Nutritional Needs:   Kcal:  1800-2000  Protein:  70-85 grams  Fluid:  >1.8 L    Gustavus Bryant, MS, RD, LDN Inpatient Clinical Dietitian Please see AMiON for contact information.

## 2021-11-05 ENCOUNTER — Inpatient Hospital Stay: Payer: Medicaid Other | Admitting: Internal Medicine

## 2021-11-05 DIAGNOSIS — G9340 Encephalopathy, unspecified: Secondary | ICD-10-CM | POA: Diagnosis not present

## 2021-11-05 LAB — BASIC METABOLIC PANEL
Anion gap: 13 (ref 5–15)
BUN: 29 mg/dL — ABNORMAL HIGH (ref 6–20)
CO2: 24 mmol/L (ref 22–32)
Calcium: 9.5 mg/dL (ref 8.9–10.3)
Chloride: 97 mmol/L — ABNORMAL LOW (ref 98–111)
Creatinine, Ser: 1.48 mg/dL — ABNORMAL HIGH (ref 0.44–1.00)
GFR, Estimated: 42 mL/min — ABNORMAL LOW (ref >60)
Glucose, Bld: 95 mg/dL (ref 70–99)
Potassium: 5.1 mmol/L (ref 3.5–5.1)
Sodium: 134 mmol/L — ABNORMAL LOW (ref 135–145)

## 2021-11-05 LAB — GLUCOSE, CAPILLARY
Glucose-Capillary: 102 mg/dL — ABNORMAL HIGH (ref 70–99)
Glucose-Capillary: 118 mg/dL — ABNORMAL HIGH (ref 70–99)
Glucose-Capillary: 128 mg/dL — ABNORMAL HIGH (ref 70–99)
Glucose-Capillary: 94 mg/dL (ref 70–99)
Glucose-Capillary: 96 mg/dL (ref 70–99)
Glucose-Capillary: 99 mg/dL (ref 70–99)

## 2021-11-05 LAB — CBC
HCT: 40.5 % (ref 36.0–46.0)
Hemoglobin: 12.8 g/dL (ref 12.0–15.0)
MCH: 28.2 pg (ref 26.0–34.0)
MCHC: 31.6 g/dL (ref 30.0–36.0)
MCV: 89.2 fL (ref 80.0–100.0)
Platelets: 365 10*3/uL (ref 150–400)
RBC: 4.54 MIL/uL (ref 3.87–5.11)
RDW: 16.3 % — ABNORMAL HIGH (ref 11.5–15.5)
WBC: 7.5 10*3/uL (ref 4.0–10.5)
nRBC: 0 % (ref 0.0–0.2)

## 2021-11-05 MED ORDER — THIAMINE HCL 100 MG PO TABS
500.0000 mg | ORAL_TABLET | Freq: Every day | ORAL | Status: AC
  Administered 2021-11-06 – 2021-11-08 (×3): 500 mg
  Filled 2021-11-05 (×3): qty 5

## 2021-11-05 MED ORDER — THIAMINE HCL 100 MG/ML IJ SOLN
100.0000 mg | Freq: Every day | INTRAMUSCULAR | Status: DC
  Administered 2021-11-09 – 2021-11-10 (×2): 100 mg via INTRAVENOUS
  Filled 2021-11-05 (×3): qty 2

## 2021-11-05 NOTE — Progress Notes (Signed)
PROGRESS NOTE    Sherry Baker  MWN:[id number] DOB: 1965/12/15 DOA: 10/24/2021 PCP: Pcp, No   Brief Narrative: 56 year old with past medical history significant for alcohol dependence, alcoholic liver cirrhosis and polysubstance abuse was brought into the emergency department 12/22 with altered mental status.  She was found to be agitated, screaming with work-up positive for cocaine and cannabis on UDS, UA was positive for probably UTI.  She was admitted to hospitalist service for further evaluation, but she still required more Ativan for delirium remained encephalopathic.  PCCM was consulted.  Patient was a started on Precedex infusion, Librium taper, CIWA Wace Ativan.  On 12/28 patient aspirated, became hypoxic and was intubated.  She was noted to have glottic edema.  Unasyn was started.  She received Solu-Medrol for glottic edema.  Subsequently she was extubated on 12/30.  She remain encephalopathy, weaning medications for alcohol withdrawal.   Assessment & Plan:   Principal Problem:   Acute encephalopathy Active Problems:   Substance abuse (HCC)   Depression   Thrombocytopenia (HCC)   Pressure injury of skin   Alcohol withdrawal (HCC)   Alcohol withdrawal delirium (HCC)   Acute respiratory failure with hypoxia (HCC)   Alcohol use disorder, severe, dependence ([place])   1-Acute Toxic Metabolic Encephalopathy Severe ETOH withdrawal. Polysubstance abuse. On Librium, Phenobarbital for alcohol withdrawal.  Change  librium to BID on 1/02.. Continue same dose today.  Ammonia Normal.  Will need to wean off phenobarbital.  On Seroquel BID.  Will need to be wean off clonidine. Change clonidine to BID 1/02. Continue to be confuse, and agitated at times, continue with currents meds.  Will do 3 days of High dose IV thiamine. Then 100 mg IV daily   2-Acute Respiratory Failure requiring MD secondary to aspiration pneumonia versus pneumonitis: -Aspirated due to dysphagia.  Glottic edema at  the time of intubation. -Continue with pulmonary hygiene, mobilize as able -Complete  5 days of Unasyn.  Needs aggressive pulmonary toilet. Flutter valve ordered.   3-Dysphagia/ Severe malnutrition.  Continue with tube feeding  Speech following, still recommending NPO.   4-CKD stage III A Monitor renal function.  Prior cr 1.4--1.3 Stable. Monitor.   EtOH cirrhosis Ammonia normal.  Continue with lactulose.   Chronic HF PEF Hypertension -PRN Hydralazine.  Resume Norvasc.  Holding coreg.  On clonidine for alcohol withdrawal. Needs taper   Hyperglycemia Related to Steroids.  SSI.   History of Rectal cancer s/p chemotherapy, status post colostomy bag Ostomy care per protocol.   Pressure Injury  chronic POA stage III cocci Wound care management.      Pressure Injury 10/25/21 Sacrum Medial Stage 3 -  Full thickness tissue loss. Subcutaneous fat may be visible but bone, tendon or muscle are NOT exposed. (Active)  10/25/21 1421  Location: Sacrum  Location Orientation: Medial  Staging: Stage 3 -  Full thickness tissue loss. Subcutaneous fat may be visible but bone, tendon or muscle are NOT exposed.  Wound Description (Comments):   Present on Admission: Yes     Nutrition Problem: Severe Malnutrition Etiology: chronic illness (cirrhosis, polysubstance abuse)    Signs/Symptoms: severe muscle depletion, severe fat depletion    Interventions: Tube feeding, MVI  Estimated body mass index is 19.31 kg/m as calculated from the following:   Height as of this encounter: _0  (1.575 m).   Weight as of this encounter: 47.9 kg.   DVT prophylaxis: Heparin  Code Status: Full code Family Communication: Cousin over phone 1/02 Disposition Plan:  Status is: Inpatient  Remains inpatient appropriate because: remain for encephalopathy requiring tube feeding         Consultants:  CCM   Procedures:  Intubation 12/28---extubation 12/30  Antimicrobials:     Subjective: She is alert, speech soft she mumble. She is on restrains.    Objective: Vitals:   11/05/21 1100 11/05/21 1148 11/05/21 1200 11/05/21 1300  BP: 117/73  105/80 115/85  Pulse: 86  70 93  Resp:   19   Temp:  97.6 F (36.4 C)    TempSrc:  Axillary    SpO2:   100%   Weight:      Height:        Intake/Output Summary (Last 24 hours) at 11/05/2021 1519 Last data filed at 11/05/2021 1323 Gross per 24 hour  Intake 825 ml  Output 1730 ml  Net -905 ml    Filed Weights   11/02/21 0500 11/03/21 0500 11/04/21 0500  Weight: 46.7 kg 45.9 kg 47.9 kg    Examination:  General exam: Chronic ill appearing Respiratory system: BL ronchus.  Cardiovascular system: S 1, S 2 RRR  Gastrointestinal system: BS present, soft, nt Central nervous system: alert, confuse Extremities: Trace edema     Data Reviewed: I have personally reviewed following labs and imaging studies  CBC: Recent Labs  Lab 10/30/21 0431 11/01/21 0127 11/04/21 0206 11/05/21 0827  WBC 5.3 5.2 6.3 7.5  NEUTROABS  --  4.4 4.4  --   HGB 10.3* 10.0* 11.0* 12.8  HCT 32.7* 32.7* 34.4* 40.5  MCV 89.8 92.1 89.8 89.2  PLT 171 194 305 735    Basic Metabolic Panel: Recent Labs  Lab 10/30/21 0431 10/30/21 1617 10/31/21 0444 11/01/21 0127 11/02/21 1234 11/03/21 0159 11/04/21 0206 11/05/21 0827  NA 139  --    < > 137 140 138 137 134*  K 3.9  --    < > 5.1 4.7 4.4 4.7 5.1  CL 110  --    < > 106 102 101 101 97*  CO2 20*  --    < > _0 GLUCOSE 126*  --    < > 156* 85 86 103* 95  BUN 13  --    < > 19 24* 21* 24* 29*  CREATININE 1.25*  --    < > 1.47* 1.21* 1.30* 1.44* 1.48*  CALCIUM 8.9  --    < > 8.8* 8.8* 9.1 9.1 9.5  MG 1.9 1.8  --  1.9  --   --   --   --   PHOS 3.5 3.4  --   --   --   --   --   --    < > = values in this interval not displayed.    GFR: Estimated Creatinine Clearance: 32.9 mL/min (A) (by C-G formula based on SCr of 1.48 mg/dL (H)). Liver Function Tests: Recent Labs   Lab 11/01/21 0127  AST 16  ALT 10  ALKPHOS 53  BILITOT 0.3  PROT 6.4*  ALBUMIN 2.2*    No results for input(s): LIPASE, AMYLASE in the last 168 hours. Recent Labs  Lab 11/01/21 0844 11/04/21 0957  AMMONIA 23 20    Coagulation Profile: No results for input(s): INR, PROTIME in the last 168 hours. Cardiac Enzymes: No results for input(s): CKTOTAL, CKMB, CKMBINDEX, TROPONINI in the last 168 hours. BNP (last 3 results) No results for input(s): PROBNP in the last 8760 hours. HbA1C: No results for input(s): HGBA1C  in the last 72 hours. CBG: Recent Labs  Lab 11/04/21 1940 11/04/21 2313 11/05/21 0348 11/05/21 0748 11/05/21 1147  GLUCAP 100* 131* 118* 96 102*    Lipid Profile: No results for input(s): CHOL, HDL, LDLCALC, TRIG, CHOLHDL, LDLDIRECT in the last 72 hours. Thyroid Function Tests: No results for input(s): TSH, T4TOTAL, FREET4, T3FREE, THYROIDAB in the last 72 hours. Anemia Panel: No results for input(s): VITAMINB12, FOLATE, FERRITIN, TIBC, IRON, RETICCTPCT in the last 72 hours. Sepsis Labs: Recent Labs  Lab 10/30/21 1916 10/31/21 0444 11/01/21 0127  PROCALCITON <0.10 <0.10 <0.10     Recent Results (from the past 240 hour(s))  Culture, Respiratory w Gram Stain     Status: None   Collection Time: 10/31/21 11:03 AM   Specimen: Tracheal Aspirate; Respiratory  Result Value Ref Range Status   Specimen Description TRACHEAL ASPIRATE  Final   Special Requests NONE  Final   Gram Stain   Final    RARE SQUAMOUS EPITHELIAL CELLS PRESENT ABUNDANT WBC PRESENT,BOTH PMN AND MONONUCLEAR FEW GRAM POSITIVE COCCI    Culture   Final    RARE Consistent with normal respiratory flora. No Pseudomonas species isolated Performed at [facility] [address], [place], [place] [address]    Report Status 11/02/2021 FINAL  Final          Radiology Studies: No results found.      Scheduled Meds:  amLODipine  10 mg Per Tube Daily   chlordiazePOXIDE  25  mg Per Tube Q12H   chlorhexidine gluconate (MEDLINE KIT)  15 mL Mouth Rinse BID   Chlorhexidine Gluconate Cloth  6 each Topical Daily   cloNIDine  0.1 mg Per NG tube BID   docusate  100 mg Per Tube BID   folic acid  1 mg Per Tube Daily   free water  150 mL Per Tube Q4H   heparin  5,000 Units Subcutaneous Q8H   insulin aspart  0-15 Units Subcutaneous Q4H   lactulose  10 g Per Tube BID   mouth rinse  15 mL Mouth Rinse QID   multivitamin with minerals  1 tablet Per Tube Daily   pantoprazole sodium  40 mg Per Tube BID   PHENObarbital  65 mg Intravenous TID   polyethylene glycol  17 g Per Tube Daily   QUEtiapine  50 mg Per Tube BID   thiamine  100 mg Per Tube Daily   Continuous Infusions:  sodium chloride 10 mL/hr at 10/29/21 1800   feeding supplement (OSMOLITE 1.5 CAL) 1,000 mL (11/05/21 1513)     LOS: 11 days    Time spent: 35 minutes.     Elmarie Shiley, MD [facility]   If 7PM-7AM, please contact night-coverage [email or web address]  11/05/2021, 3:19 PM

## 2021-11-05 NOTE — Progress Notes (Signed)
Physical Therapy Treatment Patient Details Name: Sherry Baker MRN: [id number] DOB: 08/06/1966 Today's Date: 11/05/2021   History of Present Illness 56 yo admitted 12/22 with AMS and encephalopathy. Pt with aspiration requiring intubation 12/28. Extubated 12/30. PMhx: ETOH cirrhosis, polysubstance abuse, bipolar, CHF, CKD, HTn, anxiety, rectal CA s/p ostomy    PT Comments    Pt remains confused and unable to follow commands to progress mobility. Expect mobility to improve if cognition improves.Continue to recommend SNF at DC.   Recommendations for follow up therapy are one component of a multi-disciplinary discharge planning process, led by the attending physician.  Recommendations may be updated based on patient status, additional functional criteria and insurance authorization.  Follow Up Recommendations  Skilled nursing-short term rehab (<3 hours/day)     Assistance Recommended at Discharge Frequent or constant Supervision/Assistance  Patient can return home with the following     Equipment Recommendations  Rolling walker (2 wheels);BSC/3in1    Recommendations for Other Services       Precautions / Restrictions Precautions Precautions: Fall Precaution Comments: wrist and ankle restraints, mitts, cortrack, colostomy     Mobility  Bed Mobility Overal bed mobility: Needs Assistance Bed Mobility: Supine to Sit;Sit to Supine;Rolling Rolling: Total assist   Supine to sit: +2 for physical assistance;Total assist Sit to supine: +2 for physical assistance;Max assist   General bed mobility comments: Assist with all aspects with pt not following commands.    Transfers                   General transfer comment: Unable to get pt to follow commands to attempt standing    Ambulation/Gait                   Stairs             Wheelchair Mobility    Modified Rankin (Stroke Patients Only)       Balance Overall balance assessment: Needs  assistance Sitting-balance support: Bilateral upper extremity supported;Feet unsupported Sitting balance-Leahy Scale: Poor Sitting balance - Comments: min to min guard sitting EOB                                    Cognition Arousal/Alertness: Awake/alert Behavior During Therapy: Flat affect;Restless Overall Cognitive Status: Difficult to assess                                 General Comments: Very difficult to understand most of pt's speech. Pt not following verbal commands. Pt did tell [place] correctly that yesterday was her birthday and when asked said she was 44.        Exercises      General Comments General comments (skin integrity, edema, etc.): VSS on RA      Pertinent Vitals/Pain Pain Assessment: Faces Faces Pain Scale: No hurt    Home Living                          Prior Function            PT Goals (current goals can now be found in the care plan section) Progress towards PT goals: Not progressing toward goals - comment    Frequency    Min 2X/week      PT Plan Current plan remains appropriate;Frequency needs to be  updated    Co-evaluation              AM-PAC PT "6 Clicks" Mobility   Outcome Measure  Help needed turning from your back to your side while in a flat bed without using bedrails?: Total Help needed moving from lying on your back to sitting on the side of a flat bed without using bedrails?: Total Help needed moving to and from a bed to a chair (including a wheelchair)?: Total Help needed standing up from a chair using your arms (e.g., wheelchair or bedside chair)?: Total Help needed to walk in hospital room?: Total Help needed climbing 3-5 steps with a railing? : Total 6 Click Score: 6    End of Session   Activity Tolerance: Other (comment) (limited by cognition) Patient left: in bed;with call bell/phone within reach;with bed alarm set;with restraints reapplied   PT Visit Diagnosis: Other  abnormalities of gait and mobility (R26.89);Difficulty in walking, not elsewhere classified (R26.2)     Time: 7972-8206 PT Time Calculation (min) (ACUTE ONLY): 15 min  Charges:  $Therapeutic Activity: 8-22 mins                     [place] Pager [phone] Office [place] 11/05/2021, 5:01 PM

## 2021-11-05 NOTE — Progress Notes (Signed)
[facility] night cross cover note:  I was notified by RN that patient continues to be agitated, pulling at lines, and otherwise interfering with medical care, with these actions refractory to attempts at verbal redirection.  Consequently, I have renewed existing order for soft bilateral wrist restraints.     Babs Bertin, DO Hospitalist

## 2021-11-06 DIAGNOSIS — G9340 Encephalopathy, unspecified: Secondary | ICD-10-CM | POA: Diagnosis not present

## 2021-11-06 DIAGNOSIS — J690 Pneumonitis due to inhalation of food and vomit: Secondary | ICD-10-CM

## 2021-11-06 DIAGNOSIS — F1020 Alcohol dependence, uncomplicated: Secondary | ICD-10-CM | POA: Diagnosis not present

## 2021-11-06 LAB — COMPREHENSIVE METABOLIC PANEL
ALT: 19 U/L (ref 0–44)
AST: 23 U/L (ref 15–41)
Albumin: 2.7 g/dL — ABNORMAL LOW (ref 3.5–5.0)
Alkaline Phosphatase: 64 U/L (ref 38–126)
Anion gap: 10 (ref 5–15)
BUN: 33 mg/dL — ABNORMAL HIGH (ref 6–20)
CO2: 21 mmol/L — ABNORMAL LOW (ref 22–32)
Calcium: 9.2 mg/dL (ref 8.9–10.3)
Chloride: 103 mmol/L (ref 98–111)
Creatinine, Ser: 1.45 mg/dL — ABNORMAL HIGH (ref 0.44–1.00)
GFR, Estimated: 43 mL/min — ABNORMAL LOW (ref >60)
Glucose, Bld: 121 mg/dL — ABNORMAL HIGH (ref 70–99)
Potassium: 4.6 mmol/L (ref 3.5–5.1)
Sodium: 134 mmol/L — ABNORMAL LOW (ref 135–145)
Total Bilirubin: 0.4 mg/dL (ref 0.3–1.2)
Total Protein: 7.2 g/dL (ref 6.5–8.1)

## 2021-11-06 LAB — GLUCOSE, CAPILLARY
Glucose-Capillary: 100 mg/dL — ABNORMAL HIGH (ref 70–99)
Glucose-Capillary: 90 mg/dL (ref 70–99)
Glucose-Capillary: 104 mg/dL — ABNORMAL HIGH (ref 70–99)
Glucose-Capillary: 120 mg/dL — ABNORMAL HIGH (ref 70–99)
Glucose-Capillary: 89 mg/dL (ref 70–99)
Glucose-Capillary: 88 mg/dL (ref 70–99)

## 2021-11-06 LAB — CBC
HCT: 37.3 % (ref 36.0–46.0)
Hemoglobin: 12.1 g/dL (ref 12.0–15.0)
MCH: 28.8 pg (ref 26.0–34.0)
MCHC: 32.4 g/dL (ref 30.0–36.0)
MCV: 88.8 fL (ref 80.0–100.0)
Platelets: 345 10*3/uL (ref 150–400)
RBC: 4.2 MIL/uL (ref 3.87–5.11)
RDW: 16.2 % — ABNORMAL HIGH (ref 11.5–15.5)
WBC: 7 10*3/uL (ref 4.0–10.5)
nRBC: 0 % (ref 0.0–0.2)

## 2021-11-06 LAB — MAGNESIUM: Magnesium: 2.2 mg/dL (ref 1.7–2.4)

## 2021-11-06 MED ORDER — PHENOBARBITAL SODIUM 65 MG/ML IJ SOLN
65.0000 mg | Freq: Two times a day (BID) | INTRAMUSCULAR | Status: DC
  Administered 2021-11-06 – 2021-11-07 (×3): 65 mg via INTRAVENOUS
  Filled 2021-11-06 (×4): qty 1

## 2021-11-06 NOTE — Progress Notes (Signed)
Occupational Therapy Treatment Patient Details Name: Sherry Baker MRN: [id number] DOB: 09-16-1966 Today's Date: 11/06/2021   History of present illness 56 yo admitted 12/22 with AMS and encephalopathy. Pt with aspiration requiring intubation 12/28. Extubated 12/30. PMhx: ETOH cirrhosis, polysubstance abuse, bipolar, CHF, CKD, HTn, anxiety, rectal CA s/p ostomy   OT comments  Pt remains confused, easily agitated and requires 2+ assist to safely return back to bed from long sitting today.  Initially on wrist restraints only, RN placing back to 4 point restraints after very limited OT session.  Pt able to verbalize her name, but otherwise refused to answer orientation questions.  Total assist for ADLs. Continue to recommend SNF. Will follow.    Recommendations for follow up therapy are one component of a multi-disciplinary discharge planning process, led by the attending physician.  Recommendations may be updated based on patient status, additional functional criteria and insurance authorization.    Follow Up Recommendations  Skilled nursing-short term rehab (<3 hours/day)    Assistance Recommended at Discharge Frequent or constant Supervision/Assistance  Patient can return home with the following      Equipment Recommendations  Other (comment) (TBD)    Recommendations for Other Services      Precautions / Restrictions Precautions Precautions: Fall Precaution Comments: wrist and ankle restraints, mitts, cortrack, colostomy Restrictions Weight Bearing Restrictions: No       Mobility Bed Mobility Overal bed mobility: Needs Assistance Bed Mobility: Supine to Sit;Sit to Supine     Supine to sit: Min guard Sit to supine: Total assist;+2 for physical assistance;+2 for safety/equipment   General bed mobility comments: pt transitioned to long sitting with legs over side of bed with min guard for safety, but agitated and required +2 to safely return back to supine    Transfers                          Balance                                           ADL either performed or assessed with clinical judgement   ADL                                         General ADL Comments: total assist for all self care    Extremity/Trunk Assessment              Vision       Perception     Praxis      Cognition Arousal/Alertness: Awake/alert Behavior During Therapy: Restless;Agitated;Impulsive Overall Cognitive Status: Difficult to assess                                 General Comments: pt oriented to name, refused to answer further orientation questions.  Reports waiting for her aunt to go shopping.  Became very agitated with engagement with OT, swinging UEs and kicking legs.          Exercises     Shoulder Instructions       General Comments VSs on RA    Pertinent Vitals/ Pain       Pain Assessment: Faces Faces Pain Scale: No hurt  Home  Living                                          Prior Functioning/Environment              Frequency  Min 2X/week        Progress Toward Goals  OT Goals(current goals can now be found in the care plan section)  Progress towards OT goals: Not progressing toward goals - comment (agitation)     Plan Discharge plan remains appropriate;Frequency remains appropriate    Co-evaluation                 AM-PAC OT "6 Clicks" Daily Activity     Outcome Measure   Help from another person eating meals?: Total Help from another person taking care of personal grooming?: Total Help from another person toileting, which includes using toliet, bedpan, or urinal?: Total Help from another person bathing (including washing, rinsing, drying)?: Total Help from another person to put on and taking off regular upper body clothing?: Total Help from another person to put on and taking off regular lower body clothing?: Total 6 Click Score:  6    End of Session    OT Visit Diagnosis: Other abnormalities of gait and mobility (R26.89);Muscle weakness (generalized) (M62.81);Other symptoms and signs involving cognitive function   Activity Tolerance Treatment limited secondary to agitation   Patient Left in bed;with call bell/phone within reach;with bed alarm set;with nursing/sitter in room;with restraints reapplied   Nurse Communication Mobility status        Time: 2761-4709 OT Time Calculation (min): 12 min  Charges: OT General Charges $OT Visit: 1 Visit OT Treatments $Self Care/Home Management : 8-22 mins  Jolaine Artist, OT [place] Pager [phone] Office [phone]   Delight Stare 11/06/2021, 11:32 AM

## 2021-11-06 NOTE — Progress Notes (Signed)
Speech Language Pathology Treatment: Dysphagia  Patient Details Name: Sherry Baker MRN: [id number] DOB: 1966-01-20 Today's Date: 11/06/2021 Time: 6579-0383 SLP Time Calculation (min) (ACUTE ONLY): 12 min  Assessment / Plan / Recommendation Clinical Impression  Caught pt in a brief window during which she was calm and willing to accept some POs.  She accepted six bites of applesauce and approximately 3 oz of water with adequate oral attention, palpable swallow response, no s/sx of aspiration. Her behavior was better when cueing/questions were minimized. Recommend beginning a dysphagia 1 diet with thin liquids; allow pt to feed herself with supervision.  SLP will follow for safety and diet advancement as she is able.    HPI HPI: 56 yo admitted 12/22 with AMS and encephalopathy. Pt with aspiration requiring intubation 12/28. Extubated 12/30. Notes indicate she had glottic edema at time of intubation. PMhx: ETOH cirrhosis, polysubstance abuse, bipolar, CHF, CKD, HTn, anxiety, rectal CA s/p ostomy. MBSS completed in Nov, which was WNL.      SLP Plan  Continue with current plan of care      Recommendations for follow up therapy are one component of a multi-disciplinary discharge planning process, led by the attending physician.  Recommendations may be updated based on patient status, additional functional criteria and insurance authorization.    Recommendations  Diet recommendations: Thin liquid;Dysphagia 1 (puree) Medication Administration: Via alternative means Supervision: Full supervision/cueing for compensatory strategies Compensations: Minimize environmental distractions                Oral Care Recommendations: Oral care BID Follow Up Recommendations: No SLP follow up SLP Visit Diagnosis: Dysphagia, oropharyngeal phase (R13.12) Plan: Continue with current plan of care         Trayce Maino L. Tivis Ringer, [place] Office number [phone] Pager  [phone] '  Assunta Curtis  11/06/2021, 1:37 PM

## 2021-11-06 NOTE — Progress Notes (Addendum)
PROGRESS NOTE    Sherry Baker  ZOX:[id number] DOB: 1966/06/25 DOA: 10/24/2021 PCP: Pcp, No   Brief Narrative: 56 year old with past medical history significant for alcohol dependence, alcoholic liver cirrhosis and polysubstance abuse was brought into the emergency department 12/22 with altered mental status.  She was found to be agitated, screaming with work-up positive for cocaine and cannabis on UDS, UA was positive for probably UTI.  She was admitted to hospitalist service for further evaluation, but she still required more Ativan for delirium remained encephalopathic.  PCCM was consulted.  Patient was a started on Precedex infusion, Librium taper, CIWA Wace Ativan.  On 12/28 patient aspirated, became hypoxic and was intubated.  She was noted to have glottic edema.  Unasyn was started.  She received Solu-Medrol for glottic edema.  Subsequently she was extubated on 12/30.  She remain encephalopathy, weaning medications for alcohol withdrawal.    Assessment & Plan:   Acute Toxic Metabolic Encephalopathy Severe ETOH withdrawal. Polysubstance abuse. On Librium, Phenobarbital for alcohol withdrawal.  Changed librium to BID on 1/02. Ammonia Normal.  Will need to wean off phenobarbital.  On Seroquel BID.  Will need to be wean off clonidine. Changed clonidine to BID 1/02. Continue to be confuse, and agitated at times, continue with currents meds.  Currently receiving 3 days of High dose IV thiamine. Then 100 mg IV daily   Acute Respiratory Failure secondary to aspiration pneumonia versus pneumonitis: -Aspirated due to dysphagia.  Glottic edema at the time of intubation. -Continue with pulmonary hygiene, mobilize as able -Completed 5 days of Unasyn.  Needs aggressive pulmonary toilet. Flutter valve.   Dysphagia/ Severe malnutrition.  Continue with tube feeding  Speech following, still recommending NPO.   CKD stage III A Baseline creatinine between 1.3 and 1.4.  Renal function is  stable.  Monitor urine output.    Alcoholic liver cirrhosis  Ammonia normal.  Continue with lactulose.   Chronic diastolic CHF/essential hypertension  Noted to be on amlodipine.  Monitor blood pressures closely.  Volume status seems stable.  Carvedilol on hold.  On clonidine for alcohol withdrawal.  Hyperglycemia Secondary to steroids.  Now stable.  Not noted to be on any steroids currently. SSI.   History of Rectal cancer s/p chemotherapy, status post colostomy bag Ostomy care per protocol.   Pressure Injury  chronic POA stage III cocci Wound care management.  Pressure Injury 10/25/21 Sacrum Medial Stage 3 -  Full thickness tissue loss. Subcutaneous fat may be visible but bone, tendon or muscle are NOT exposed. (Active)  10/25/21 1421  Location: Sacrum  Location Orientation: Medial  Staging: Stage 3 -  Full thickness tissue loss. Subcutaneous fat may be visible but bone, tendon or muscle are NOT exposed.  Wound Description (Comments):   Present on Admission: Yes    Severe protein calorie malnutrition Nutrition Problem: Severe Malnutrition Etiology: chronic illness (cirrhosis, polysubstance abuse) Signs/Symptoms: severe muscle depletion, severe fat depletion Interventions: Tube feeding, MVI   DVT prophylaxis: Subcutaneous Heparin  Code Status: Full code Family Communication: Cousin over phone 1/02 Disposition Plan: To be determined  Status is: Inpatient  Remains inpatient appropriate because: remain for encephalopathy requiring tube feeding     Consultants:  CCM   Procedures:  Intubation 12/28---extubation 12/30  Antimicrobials:    Subjective: Awake alert.  Confused at times.  Does not answer all questions appropriately.  No overnight issues reported by nursing staff except for periodic agitation   Objective: Vitals:   11/06/21 0713 11/06/21 0800 11/06/21 1014  11/06/21 1110  BP:   (!) 169/92   Pulse:  97    Resp:  18    Temp: (!) 97.5 F (36.4 C)   98  F (36.7 C)  TempSrc: Oral   Oral  SpO2:  100%    Weight:      Height:        Intake/Output Summary (Last 24 hours) at 11/06/2021 1136 Last data filed at 11/06/2021 0800 Gross per 24 hour  Intake 1700 ml  Output 775 ml  Net 925 ml    Filed Weights   11/03/21 0500 11/04/21 0500 11/06/21 0500  Weight: 45.9 kg 47.9 kg 48.2 kg    Examination:  General appearance: Awake alert.  In no distress.  Distracted Resp: Clear to auscultation bilaterally.  Normal effort Cardio: S1-S2 is normal regular.  No S3-S4.  No rubs murmurs or bruit GI: Abdomen is soft.  Nontender nondistended.  Bowel sounds are present normal.  No masses organomegaly Extremities: No edema.  Full range of motion of lower extremities. Neurologic: Oriented to place.  Thought it was 2022.  No focal neurological deficits.      Data Reviewed: I have personally reviewed following labs and imaging studies  CBC: Recent Labs  Lab 11/01/21 0127 11/04/21 0206 11/05/21 0827 11/06/21 0255  WBC 5.2 6.3 7.5 7.0  NEUTROABS 4.4 4.4  --   --   HGB 10.0* 11.0* 12.8 12.1  HCT 32.7* 34.4* 40.5 37.3  MCV 92.1 89.8 89.2 88.8  PLT 194 305 365 562    Basic Metabolic Panel: Recent Labs  Lab 10/30/21 1617 10/31/21 0444 11/01/21 0127 11/02/21 1234 11/03/21 0159 11/04/21 0206 11/05/21 0827 11/06/21 0255  NA  --    < > 137 140 138 137 134* 134*  K  --    < > 5.1 4.7 4.4 4.7 5.1 4.6  CL  --    < > 106 102 101 101 97* 103  CO2  --    < > _0 21*  GLUCOSE  --    < > 156* 85 86 103* 95 121*  BUN  --    < > 19 24* 21* 24* 29* 33*  CREATININE  --    < > 1.47* 1.21* 1.30* 1.44* 1.48* 1.45*  CALCIUM  --    < > 8.8* 8.8* 9.1 9.1 9.5 9.2  MG 1.8  --  1.9  --   --   --   --  2.2  PHOS 3.4  --   --   --   --   --   --   --    < > = values in this interval not displayed.    GFR: Estimated Creatinine Clearance: 33.7 mL/min (A) (by C-G formula based on SCr of 1.45 mg/dL (H)). Liver Function Tests: Recent Labs  Lab  11/01/21 0127 11/06/21 0255  AST 16 23  ALT 10 19  ALKPHOS 53 64  BILITOT 0.3 0.4  PROT 6.4* 7.2  ALBUMIN 2.2* 2.7*     Recent Labs  Lab 11/01/21 0844 11/04/21 0957  AMMONIA 23 20    CBG: Recent Labs  Lab 11/05/21 1939 11/05/21 2350 11/06/21 0406 11/06/21 0711 11/06/21 1113  GLUCAP 128* 99 100* 90 104*    Sepsis Labs: Recent Labs  Lab 10/30/21 1916 10/31/21 0444 11/01/21 0127  PROCALCITON <0.10 <0.10 <0.10     Recent Results (from the past 240 hour(s))  Culture, Respiratory w Gram Stain  Status: None   Collection Time: 10/31/21 11:03 AM   Specimen: Tracheal Aspirate; Respiratory  Result Value Ref Range Status   Specimen Description TRACHEAL ASPIRATE  Final   Special Requests NONE  Final   Gram Stain   Final    RARE SQUAMOUS EPITHELIAL CELLS PRESENT ABUNDANT WBC PRESENT,BOTH PMN AND MONONUCLEAR FEW GRAM POSITIVE COCCI    Culture   Final    RARE Consistent with normal respiratory flora. No Pseudomonas species isolated Performed at [facility] [address], [place], [place] [address]    Report Status 11/02/2021 FINAL  Final          Radiology Studies: No results found.      Scheduled Meds:  amLODipine  10 mg Per Tube Daily   chlordiazePOXIDE  25 mg Per Tube Q12H   chlorhexidine gluconate (MEDLINE KIT)  15 mL Mouth Rinse BID   Chlorhexidine Gluconate Cloth  6 each Topical Daily   cloNIDine  0.1 mg Per NG tube BID   docusate  100 mg Per Tube BID   folic acid  1 mg Per Tube Daily   free water  150 mL Per Tube Q4H   heparin  5,000 Units Subcutaneous Q8H   insulin aspart  0-15 Units Subcutaneous Q4H   lactulose  10 g Per Tube BID   mouth rinse  15 mL Mouth Rinse QID   multivitamin with minerals  1 tablet Per Tube Daily   pantoprazole sodium  40 mg Per Tube BID   PHENObarbital  65 mg Intravenous TID   polyethylene glycol  17 g Per Tube Daily   QUEtiapine  50 mg Per Tube BID   [START ON 11/09/2021] thiamine injection  100 mg  Intravenous Daily   thiamine  500 mg Per Tube Daily   Continuous Infusions:  sodium chloride 10 mL/hr at 10/29/21 1800   feeding supplement (OSMOLITE 1.5 CAL) 1,000 mL (11/05/21 1513)     LOS: 12 days     Bonnielee Haff, MD [facility]   If 7PM-7AM, please contact night-coverage [email or web address]  11/06/2021, 11:36 AM

## 2021-11-07 DIAGNOSIS — G9340 Encephalopathy, unspecified: Secondary | ICD-10-CM | POA: Diagnosis not present

## 2021-11-07 DIAGNOSIS — I10 Essential (primary) hypertension: Secondary | ICD-10-CM | POA: Diagnosis not present

## 2021-11-07 DIAGNOSIS — F1020 Alcohol dependence, uncomplicated: Secondary | ICD-10-CM | POA: Diagnosis not present

## 2021-11-07 LAB — GLUCOSE, CAPILLARY
Glucose-Capillary: 106 mg/dL — ABNORMAL HIGH (ref 70–99)
Glucose-Capillary: 122 mg/dL — ABNORMAL HIGH (ref 70–99)
Glucose-Capillary: 85 mg/dL (ref 70–99)
Glucose-Capillary: 85 mg/dL (ref 70–99)
Glucose-Capillary: 91 mg/dL (ref 70–99)
Glucose-Capillary: 98 mg/dL (ref 70–99)

## 2021-11-07 MED ORDER — CLONIDINE HCL 0.1 MG PO TABS
0.1000 mg | ORAL_TABLET | Freq: Every day | ORAL | Status: AC
Start: 2021-11-07 — End: 2021-11-08
  Administered 2021-11-07 – 2021-11-08 (×2): 0.1 mg via NASOGASTRIC
  Filled 2021-11-07 (×2): qty 1

## 2021-11-07 MED ORDER — CHLORHEXIDINE GLUCONATE 0.12% ORAL RINSE (MEDLINE KIT)
15.0000 mL | Freq: Two times a day (BID) | OROMUCOSAL | Status: DC
  Administered 2021-11-07 – 2021-11-15 (×12): 15 mL via OROMUCOSAL

## 2021-11-07 NOTE — Progress Notes (Signed)
Nutrition Follow-up  DOCUMENTATION CODES:   Underweight, Severe malnutrition in context of chronic illness  INTERVENTION:   Continue tube feeds via Cortrak: Osmolite 1.5 @ 55 ml/hr (1320 ml/day) Free water flushes per MD, currently 150 ml q 4 hours Tube feeding regimen provides 1980 kcal, 83 grams of protein, and 1006 mL of free water (1906 mL total free water) daily.   Continue MVI with minerals daily per tube Vital Cuisine Shake TID, each supplement provides 520 kcal and 22 grams of protein  NUTRITION DIAGNOSIS:   Severe Malnutrition related to chronic illness (cirrhosis, polysubstance abuse) as evidenced by severe muscle depletion, severe fat depletion. - Ongoing, being addressed via TF  GOAL:   Patient will meet greater than or equal to 90% of their needs - Met via TF  MONITOR:   PO intake, Diet advancement, TF tolerance, Skin  REASON FOR ASSESSMENT:   Consult Enteral/tube feeding initiation and management  ASSESSMENT:   56 yo female with a PMH of alcohol dependence, alcoholic liver cirrhosis, rectal cancer s/p colostomy, and polysubstance abuse who presents with altered mental status. She was found to be agitated, screaming, work-up UDS positive for cocaine and cannabis, UA was positive for probable UTI.  12/24 - NG tube placed 12/28 - Cortrak placed (tip gastric), aspiration event, intubated 12/30 - extubated 01/04 - transferred to floor; diet advanced to Dysphagia 1  Pt still a bit confused. RD observed breakfast tray in room, <25% of tray consumed.  Per EMR, pt ate 15% dinner on 01/04.  Spoke with RN outside of room. RN reports that pt is still confused and there is still a disconnect of being able to feed herself. RN states that he or NT will make sure that she is fed. Discussed with RN about adding ONS to pt tray to add calories and protein. RD informed RN that we will hold on transitioning TF to nocturnal feeds until she is eating a little more.  Medications  reviewed and include: Colace, Folic acid, SSI 6-81 units q4h, Lactulose, MVI with minerals daily, Protonix, Miralax, Thiamine  Labs reviewed: BUN 33, Creatinine 1.45, 24 hr CBG: 85-122  Diet Order:   Diet Order             DIET - DYS 1 Room service appropriate? Yes; Fluid consistency: Thin  Diet effective now                   EDUCATION NEEDS:   Not appropriate for education at this time  Skin:  Skin Assessment: Skin Integrity Issues: Stage III: Sacrum  Last BM:  01/05 - 200 mL via colostomy  Height:   Ht Readings from Last 1 Encounters:  10/25/21 _0  (1.575 m)    Weight:   Wt Readings from Last 1 Encounters:  11/07/21 41.1 kg    BMI:  Body mass index is 16.57 kg/m.  Estimated Nutritional Needs:   Kcal:  1800-2000  Protein:  70-85 grams  Fluid:  >1.8 L   Lorilynn Lehr Louie Casa, RD, LDN Clinical Dietitian See [facility] for contact information.

## 2021-11-07 NOTE — Progress Notes (Signed)
PROGRESS NOTE    Sherry Baker  XHB:[id number] DOB: [birth year] DOA: 10/24/2021 PCP: Pcp, No   Brief Narrative: 56 year old with past medical history significant for alcohol dependence, alcoholic liver cirrhosis and polysubstance abuse was brought into the emergency department 12/22 with altered mental status.  She was found to be agitated, screaming with work-up positive for cocaine and cannabis on UDS, UA was positive for probably UTI.  She was admitted to hospitalist service for further evaluation, but she still required more Ativan for delirium remained encephalopathic.  PCCM was consulted.  Patient was a started on Precedex infusion, Librium taper, CIWA Wace Ativan.  On 12/28 patient aspirated, became hypoxic and was intubated.  She was noted to have glottic edema.  Unasyn was started.  She received Solu-Medrol for glottic edema.  Subsequently she was extubated on 12/30.  She remain encephalopathy, weaning medications for alcohol withdrawal.    Assessment & Plan:   Acute Toxic Metabolic Encephalopathy Patient went through severe alcohol withdrawal.  Also has history of polysubstance abuse.  On Librium, Phenobarbital for alcohol withdrawal.  Librium and phenobarbital being weaned off gradually.   Ammonia level was normal. On Seroquel as well. Clonidine also being weaned off gradually. Mentation appears to be slowly improving.   Currently on 3 days of high-dose IV thiamine followed by 100 mg IV daily.    Acute Respiratory Failure secondary to aspiration pneumonia versus pneumonitis: -Aspirated due to dysphagia.  Glottic edema at the time of intubation. -Continue with pulmonary hygiene, mobilize as able -Completed 5 days of Unasyn.  Respiratory status is stable.  Dysphagia/ Severe malnutrition.  Continue with tube feeding  Speech therapy is following.  Approved for dysphagia 1 diet.  Aspiration precautions.    CKD stage III A Baseline creatinine between 1.3 and 1.4.  Renal  function is stable.  Monitor urine output.    Alcoholic liver cirrhosis  Ammonia normal. Continue with lactulose.   Chronic diastolic CHF/essential hypertension  Blood pressure is stable.  Continue amlodipine. Volume status stable.  Carvedilol on hold.  Clonidine being weaned off.    Hyperglycemia Secondary to steroids.  Now stable.  Not noted to be on any steroids currently. SSI tube feeding coverage.   History of Rectal cancer s/p chemotherapy, status post colostomy bag Ostomy care per protocol.   Pressure Injury  chronic POA stage III cocci Wound care management.  Pressure Injury 10/25/21 Sacrum Medial Stage 3 -  Full thickness tissue loss. Subcutaneous fat may be visible but bone, tendon or muscle are NOT exposed. (Active)  10/25/21 1421  Location: Sacrum  Location Orientation: Medial  Staging: Stage 3 -  Full thickness tissue loss. Subcutaneous fat may be visible but bone, tendon or muscle are NOT exposed.  Wound Description (Comments):   Present on Admission: Yes    Severe protein calorie malnutrition Nutrition Problem: Severe Malnutrition Etiology: chronic illness (cirrhosis, polysubstance abuse) Signs/Symptoms: severe muscle depletion, severe fat depletion Interventions: MVI, Tube feeding   DVT prophylaxis: Subcutaneous Heparin  Code Status: Full code Family Communication: No family at bedside Disposition Plan: SNF is recommended currently  Status is: Inpatient  Remains inpatient appropriate because: remain for encephalopathy requiring tube feeding     Consultants:  CCM   Procedures:  Intubation 12/28---extubation 12/30  Antimicrobials:    Subjective: Patient is awake alert.  Distracted.  Emotional.  Denies any pain.  Asking for restraints to be removed.     Objective: Vitals:   11/07/21 0000 11/07/21 0400 11/07/21 0525 11/07/21 0800  BP: 95/66 112/60  139/82  Pulse: 86 85  92  Resp: '17 20  19  ' Temp: (!) 97 F (36.1 C) 98.1 F (36.7 C)  98.4  F (36.9 C)  TempSrc: Axillary Axillary  Oral  SpO2: 100% 100%  100%  Weight:   41.1 kg   Height:        Intake/Output Summary (Last 24 hours) at 11/07/2021 1135 Last data filed at 11/07/2021 0700 Gross per 24 hour  Intake 1310 ml  Output 1300 ml  Net 10 ml    Filed Weights   11/04/21 0500 11/06/21 0500 11/07/21 0525  Weight: 47.9 kg 48.2 kg 41.1 kg    Examination:  General appearance: Awake alert.  In no distress.  Distracted Feeding tube noted in the nostril Resp: Clear to auscultation bilaterally.  Normal effort Cardio: S1-S2 is normal regular.  No S3-S4.  No rubs murmurs or bruit GI: Abdomen is soft.  Nontender nondistended.  Bowel sounds are present normal.  No masses organomegaly Extremities: No edema.  Moving all of her extremities.  Restraints noted Neurologic: No obvious focal deficits.  Oriented to place..       Data Reviewed: I have personally reviewed following labs and imaging studies  CBC: Recent Labs  Lab 11/01/21 0127 11/04/21 0206 11/05/21 0827 11/06/21 0255  WBC 5.2 6.3 7.5 7.0  NEUTROABS 4.4 4.4  --   --   HGB 10.0* 11.0* 12.8 12.1  HCT 32.7* 34.4* 40.5 37.3  MCV 92.1 89.8 89.2 88.8  PLT 194 305 365 007    Basic Metabolic Panel: Recent Labs  Lab 11/01/21 0127 11/02/21 1234 11/03/21 0159 11/04/21 0206 11/05/21 0827 11/06/21 0255  NA 137 140 138 137 134* 134*  K 5.1 4.7 4.4 4.7 5.1 4.6  CL 106 102 101 101 97* 103  CO2 '24 27 26 27 24 ' 21*  GLUCOSE 156* 85 86 103* 95 121*  BUN 19 24* 21* 24* 29* 33*  CREATININE 1.47* 1.21* 1.30* 1.44* 1.48* 1.45*  CALCIUM 8.8* 8.8* 9.1 9.1 9.5 9.2  MG 1.9  --   --   --   --  2.2    GFR: Estimated Creatinine Clearance: 28.8 mL/min (A) (by C-G formula based on SCr of 1.45 mg/dL (H)). Liver Function Tests: Recent Labs  Lab 11/01/21 0127 11/06/21 0255  AST 16 23  ALT 10 19  ALKPHOS 53 64  BILITOT 0.3 0.4  PROT 6.4* 7.2  ALBUMIN 2.2* 2.7*     Recent Labs  Lab 11/01/21 0844 11/04/21 0957   AMMONIA 23 20    CBG: Recent Labs  Lab 11/06/21 1522 11/06/21 1959 11/07/21 0026 11/07/21 0501 11/07/21 0803  GLUCAP 89 88 122* 106* 85    Sepsis Labs: Recent Labs  Lab 11/01/21 0127  PROCALCITON <0.10     Recent Results (from the past 240 hour(s))  Culture, Respiratory w Gram Stain     Status: None   Collection Time: 10/31/21 11:03 AM   Specimen: Tracheal Aspirate; Respiratory  Result Value Ref Range Status   Specimen Description TRACHEAL ASPIRATE  Final   Special Requests NONE  Final   Gram Stain   Final    RARE SQUAMOUS EPITHELIAL CELLS PRESENT ABUNDANT WBC PRESENT,BOTH PMN AND MONONUCLEAR FEW GRAM POSITIVE COCCI    Culture   Final    RARE Consistent with normal respiratory flora. No Pseudomonas species isolated Performed at [facility] [address], [place], [place] [address]    Report Status 11/02/2021 FINAL  Final          Radiology Studies: No results found.      Scheduled Meds:  amLODipine  10 mg Per Tube Daily   chlordiazePOXIDE  25 mg Per Tube Q12H   chlorhexidine gluconate (MEDLINE KIT)  15 mL Mouth Rinse BID   Chlorhexidine Gluconate Cloth  6 each Topical Daily   cloNIDine  0.1 mg Per NG tube Daily   docusate  100 mg Per Tube BID   folic acid  1 mg Per Tube Daily   free water  150 mL Per Tube Q4H   heparin  5,000 Units Subcutaneous Q8H   insulin aspart  0-15 Units Subcutaneous Q4H   lactulose  10 g Per Tube BID   mouth rinse  15 mL Mouth Rinse QID   multivitamin with minerals  1 tablet Per Tube Daily   pantoprazole sodium  40 mg Per Tube BID   PHENObarbital  65 mg Intravenous BID   polyethylene glycol  17 g Per Tube Daily   QUEtiapine  50 mg Per Tube BID   [START ON 11/09/2021] thiamine injection  100 mg Intravenous Daily   thiamine  500 mg Per Tube Daily   Continuous Infusions:  sodium chloride 10 mL/hr at 10/29/21 1800   feeding supplement (OSMOLITE 1.5 CAL) 1,000 mL (11/07/21 0636)     LOS: 13 days     Bonnielee Haff, MD [facility]   If 7PM-7AM, please contact night-coverage [email or web address]  11/07/2021, 11:35 AM

## 2021-11-07 NOTE — TOC Initial Note (Signed)
Transition of Care [facility]) - Initial/Assessment Note    Patient Details  Name: Sherry Baker MRN: [id number] Date of Birth: 17-Apr-1966  Transition of [facility]) CM/SW Contact:    Benard Halsted, LCSW Phone Number: 11/07/2021, 8:48 AM  Clinical Narrative:                 CSW received consult for SNF placement. Patient well known to [facility] team; upon last admission patient unable to be placed in SNF due to substance use and patient chose to discharge home to her apartment. Barriers still in place for any sort of placement.   Expected Discharge Plan: Home/Self Care Barriers to Discharge: ED Active Substance Abuse, Continued Medical Work up   Patient Goals and CMS Choice        Expected Discharge Plan and Services Expected Discharge Plan: Home/Self Care In-house Referral: Clinical Social Work     Living arrangements for the past 2 months: Apartment                                      Prior Living Arrangements/Services Living arrangements for the past 2 months: Apartment Lives with:: Self Patient language and need for interpreter reviewed:: Yes Do you feel safe going back to the place where you live?: Yes      Need for Family Participation in Patient Care: Yes (Comment) Care giver support system in place?: Yes (comment) Current home services: DME (Colostomy bag and walker) Criminal Activity/Legal Involvement Pertinent to Current Situation/Hospitalization: No - Comment as needed  Activities of Daily Living      Permission Sought/Granted                  Emotional Assessment Appearance:: Appears stated age Attitude/Demeanor/Rapport: Guarded Affect (typically observed): Guarded Orientation: : Oriented to Self, Oriented to Place, Oriented to  Time, Oriented to Situation Alcohol / Substance Use: Illicit Drugs Psych Involvement: No (comment)  Admission diagnosis:  Alcohol withdrawal delirium ([place]) [F10.931] Fatty liver [K76.0] Agitation [R45.1] Cocaine abuse  ([place]) [F14.10] Renal insufficiency [N28.9] Urinary tract infection without hematuria, site unspecified [N39.0] AMS (altered mental status) [R41.82] Patient Active Problem List   Diagnosis Date Noted   Alcohol use disorder, severe, dependence ([place])    Acute respiratory failure with hypoxia ([place])    Alcohol withdrawal delirium ([place]) 10/25/2021   Physical deconditioning    Ambulatory dysfunction    Underweight 06/25/2021   Protein-calorie malnutrition, severe 06/25/2021   Homelessness 06/23/2021   Toxic encephalopathy 26/20/3559   Alcoholic cirrhosis of liver without ascites ([place]) 06/23/2021   COVID-19 virus infection 06/23/2021   Delirium tremens ([place]) 06/22/2021   Altered mental status 03/29/2021   Alcohol withdrawal ([place]) 03/28/2021   Alcohol abuse 06/14/2019   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   Normochromic normocytic anemia 06/06/2019   Pressure injury of skin 06/06/2019   Substance abuse ([place])    Seizure ([place]) 05/21/2019   Acute encephalopathy 05/18/2019   Acute lower UTI 05/18/2019   Polysubstance abuse ([place]) 05/18/2019   Wound dehiscence 07/10/2017   Genetic testing 03/24/2017   Hypertension 02/18/2017   GERD (gastroesophageal reflux disease) 02/18/2017   Rectal cancer ([place]) 01/27/2017   Abdominal pain, epigastric    Rectal mass    Fever    Anemia 01/16/2017   Abdominal pain    Constipation    Alcohol abuse    Tobacco abuse    Cocaine abuse ([place])  Bipolar affective disorder ([place])    Severe malnutrition ([place]) 02/13/2014   Suicide ideation 02/12/2014   Suicidal ideation 02/11/2014   Acute alcohol intoxication, with delirium ([place]) 02/11/2014   Transaminitis 02/11/2014   Substance abuse ([place]) 02/11/2014   Depression 02/11/2014   Thrombocytopenia ([place]) 02/11/2014   PCP:  Pcp, No Pharmacy:   [facility] [address] [place] [address] Phone: [phone] Fax: [phone]     Social Determinants of Health  (SDOH) Interventions    Readmission Risk Interventions Readmission Risk Prevention Plan 09/30/2021 09/24/2021  Transportation Screening Complete Complete  Medication Review Press photographer) Complete Complete  PCP or Specialist appointment within 3-5 days of discharge Complete Complete  HRI or [place] Not Complete Not Complete  HRI or Home Care Consult Pt Refusal Comments - history of unsafe home environment  SW Recovery Care/Counseling Consult Complete Complete  Palliative Care Screening Not Applicable Not [place] Not Applicable Complete  Some recent data might be hidden

## 2021-11-08 DIAGNOSIS — I10 Essential (primary) hypertension: Secondary | ICD-10-CM | POA: Diagnosis not present

## 2021-11-08 DIAGNOSIS — F1020 Alcohol dependence, uncomplicated: Secondary | ICD-10-CM | POA: Diagnosis not present

## 2021-11-08 DIAGNOSIS — G9340 Encephalopathy, unspecified: Secondary | ICD-10-CM | POA: Diagnosis not present

## 2021-11-08 LAB — CBC
HCT: 34.9 % — ABNORMAL LOW (ref 36.0–46.0)
Hemoglobin: 11.1 g/dL — ABNORMAL LOW (ref 12.0–15.0)
MCH: 28 pg (ref 26.0–34.0)
MCHC: 31.8 g/dL (ref 30.0–36.0)
MCV: 87.9 fL (ref 80.0–100.0)
Platelets: 349 10*3/uL (ref 150–400)
RBC: 3.97 MIL/uL (ref 3.87–5.11)
RDW: 16 % — ABNORMAL HIGH (ref 11.5–15.5)
WBC: 5.9 10*3/uL (ref 4.0–10.5)
nRBC: 0 % (ref 0.0–0.2)

## 2021-11-08 LAB — COMPREHENSIVE METABOLIC PANEL
ALT: 22 U/L (ref 0–44)
AST: 20 U/L (ref 15–41)
Albumin: 2.8 g/dL — ABNORMAL LOW (ref 3.5–5.0)
Alkaline Phosphatase: 70 U/L (ref 38–126)
Anion gap: 10 (ref 5–15)
BUN: 39 mg/dL — ABNORMAL HIGH (ref 6–20)
CO2: 24 mmol/L (ref 22–32)
Calcium: 9.4 mg/dL (ref 8.9–10.3)
Chloride: 101 mmol/L (ref 98–111)
Creatinine, Ser: 1.51 mg/dL — ABNORMAL HIGH (ref 0.44–1.00)
GFR, Estimated: 41 mL/min — ABNORMAL LOW (ref >60)
Glucose, Bld: 96 mg/dL (ref 70–99)
Potassium: 4.3 mmol/L (ref 3.5–5.1)
Sodium: 135 mmol/L (ref 135–145)
Total Bilirubin: 0.3 mg/dL (ref 0.3–1.2)
Total Protein: 7 g/dL (ref 6.5–8.1)

## 2021-11-08 LAB — GLUCOSE, CAPILLARY
Glucose-Capillary: 102 mg/dL — ABNORMAL HIGH (ref 70–99)
Glucose-Capillary: 111 mg/dL — ABNORMAL HIGH (ref 70–99)
Glucose-Capillary: 122 mg/dL — ABNORMAL HIGH (ref 70–99)
Glucose-Capillary: 92 mg/dL (ref 70–99)
Glucose-Capillary: 94 mg/dL (ref 70–99)
Glucose-Capillary: 97 mg/dL (ref 70–99)

## 2021-11-08 MED ORDER — PHENOBARBITAL 32.4 MG PO TABS
32.4000 mg | ORAL_TABLET | Freq: Two times a day (BID) | ORAL | Status: DC
  Administered 2021-11-08 – 2021-11-11 (×8): 32.4 mg
  Filled 2021-11-08 (×8): qty 1

## 2021-11-08 NOTE — Progress Notes (Signed)
Speech Language Pathology Treatment: Dysphagia  Patient Details Name: Sherry Baker MRN: [id number] DOB: 1965/11/20 Today's Date: 11/08/2021 Time: 0511-0211 SLP Time Calculation (min) (ACUTE ONLY): 12 min  Assessment / Plan / Recommendation Clinical Impression  Pt was seen for dysphagia treatment. Pt's RN and NT reported that the pt refused breakfast, and stated that she only wanted beer. She was alert during the session with a wet vocal quality at baseline which was improved with pt's independent coughing. Mastication was prolonged and inconsistent with reduced bolus awareness. Pt demonstrated inconsistent coughing with regular textures solids, suggesting possible aspiration. No other s/sx of aspiration were noted with puree, dysphagia 2, or thin liquids. Pt's current diet of dysphagia 1 solids and thin liquids will be continued, and SLP will continue to follow pt.       HPI HPI: 56 yo admitted 12/22 with AMS and encephalopathy. Pt with aspiration requiring intubation 12/28. Extubated 12/30. Notes indicate she had glottic edema at time of intubation. PMhx: ETOH cirrhosis, polysubstance abuse, bipolar, CHF, CKD, HTn, anxiety, rectal CA s/p ostomy. MBSS completed in Nov, which was WNL.      SLP Plan  Continue with current plan of care      Recommendations for follow up therapy are one component of a multi-disciplinary discharge planning process, led by the attending physician.  Recommendations may be updated based on patient status, additional functional criteria and insurance authorization.    Recommendations  Diet recommendations: Dysphagia 1 (puree);Thin liquid Liquids provided via: Cup;Straw Medication Administration: Via alternative means Supervision: Full supervision/cueing for compensatory strategies Compensations: Minimize environmental distractions                Oral Care Recommendations: Oral care BID Follow Up Recommendations: No SLP follow up SLP Visit Diagnosis:  Dysphagia, oropharyngeal phase (R13.12) Plan: Continue with current plan of care         Zaccheus Edmister I. Hardin Negus, [place], [place] Office number [phone] Pager [place]  11/08/2021, 3:26 PM

## 2021-11-08 NOTE — Progress Notes (Signed)
PROGRESS NOTE    Sherry Baker  FGH:[id number] DOB: 1966-05-07 DOA: 10/24/2021 PCP: Pcp, No   Brief Narrative: 56 year old with past medical history significant for alcohol dependence, alcoholic liver cirrhosis and polysubstance abuse was brought into the emergency department 12/22 with altered mental status.  She was found to be agitated, screaming with work-up positive for cocaine and cannabis on UDS, UA was positive for probably UTI.  She was admitted to hospitalist service for further evaluation, but she still required more Ativan for delirium remained encephalopathic.  PCCM was consulted.  Patient was a started on Precedex infusion, Librium taper, CIWA Wace Ativan.  On 12/28 patient aspirated, became hypoxic and was intubated.  She was noted to have glottic edema.  Unasyn was started.  She received Solu-Medrol for glottic edema.  Subsequently she was extubated on 12/30.  She remain encephalopathic, weaning medications for alcohol withdrawal.    Assessment & Plan:   Acute Toxic Metabolic Encephalopathy Patient went through severe alcohol withdrawal.  Also has history of polysubstance abuse.  On Librium, Phenobarbital for alcohol withdrawal.  Librium and phenobarbital being weaned off gradually.   Ammonia level was normal. On Seroquel as well. Clonidine has been weaned off. Mentation has improved but she remains confused at times. Also received 3 days of high-dose thiamine.  Currently on thiamine 100 mg once a day   Acute Respiratory Failure secondary to aspiration pneumonia versus pneumonitis: -Aspirated due to dysphagia.  Glottic edema at the time of intubation. -Continue with pulmonary hygiene, mobilize as able -Completed 5 days of Unasyn.  Respiratory status is stable.  Dysphagia/ Severe malnutrition.  Continue with tube feeding  Speech therapy is following.  Approved for dysphagia 1 diet.  Aspiration precautions.  Continue to monitor.  CKD stage III A Baseline creatinine  between 1.3 and 1.4.  Noted to be 1.5 today.  Monitor urine output.  Recheck labs in few days.    Alcoholic liver cirrhosis  Ammonia normal. Continue with lactulose.   Chronic diastolic CHF/essential hypertension  Blood pressure is stable.  Continue amlodipine. Carvedilol on hold.  Clonidine being weaned off.    Hyperglycemia Secondary to steroids.  Now stable.  Not noted to be on any steroids currently. SSI tube feeding coverage.   History of Rectal cancer s/p chemotherapy, status post colostomy bag Ostomy care per protocol.   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 Subcutaneous fat may be visible but bone, tendon or muscle are NOT exposed. (Active)  10/25/21 1421  Location: Sacrum  Location Orientation: Medial  Staging: Stage 3 -  Full thickness tissue loss. Subcutaneous fat may be visible but bone, tendon or muscle are NOT exposed.  Wound Description (Comments):   Present on Admission: Yes    Severe protein calorie malnutrition Nutrition Problem: Severe Malnutrition Etiology: chronic illness (cirrhosis, polysubstance abuse) Signs/Symptoms: severe muscle depletion, severe fat depletion Interventions: MVI, Tube feeding, Hormel Shake   DVT prophylaxis: Subcutaneous Heparin  Code Status: Full code Family Communication: No family at bedside Disposition Plan: SNF is recommended currently  Status is: Inpatient  Remains inpatient appropriate because: remain for encephalopathy requiring tube feeding     Consultants:  CCM   Procedures:  Intubation 12/28---extubation 12/30  Antimicrobials:    Subjective: Patient is awake alert.  Distracted.  Crying at times.  Wants to come off of restraints.   Objective: Vitals:   11/08/21 0000 11/08/21 6789 11/08/21 3810 11/08/21 1751  BP: 107/71 119/84  (!) 149/86  Pulse: (!) 102 97 99 100  Resp: '20 20 19 16  ' Temp: 98.2 F (36.8 C) 98.1 F  (36.7 C)  98.1 F (36.7 C)  TempSrc: Axillary Oral  Oral  SpO2: 99% 94% 100% 100%  Weight:   39.5 kg   Height:        Intake/Output Summary (Last 24 hours) at 11/08/2021 1041 Last data filed at 11/08/2021 0428 Gross per 24 hour  Intake 1370.67 ml  Output --  Net 1370.67 ml    Filed Weights   11/06/21 0500 11/07/21 0525 11/08/21 0527  Weight: 48.2 kg 41.1 kg 39.5 kg    Examination:  General appearance: Awake alert.  In no distress.  Distracted Resp: Clear to auscultation bilaterally.  Normal effort Cardio: S1-S2 is normal regular.  No S3-S4.  No rubs murmurs or bruit GI: Abdomen is soft.  Nontender nondistended.  Bowel sounds are present normal.  No masses organomegaly Extremities: No edema.  Moving all 4 extreme Neurologic: Oriented to year, month.  Confused about place.  No focal neurological deficits.       Data Reviewed: I have personally reviewed following labs and imaging studies  CBC: Recent Labs  Lab 11/04/21 0206 11/05/21 0827 11/06/21 0255 11/08/21 0053  WBC 6.3 7.5 7.0 5.9  NEUTROABS 4.4  --   --   --   HGB 11.0* 12.8 12.1 11.1*  HCT 34.4* 40.5 37.3 34.9*  MCV 89.8 89.2 88.8 87.9  PLT 305 365 345 384    Basic Metabolic Panel: Recent Labs  Lab 11/03/21 0159 11/04/21 0206 11/05/21 0827 11/06/21 0255 11/08/21 0053  NA 138 137 134* 134* 135  K 4.4 4.7 5.1 4.6 4.3  CL 101 101 97* 103 101  CO2 '26 27 24 ' 21* 24  GLUCOSE 86 103* 95 121* 96  BUN 21* 24* 29* 33* 39*  CREATININE 1.30* 1.44* 1.48* 1.45* 1.51*  CALCIUM 9.1 9.1 9.5 9.2 9.4  MG  --   --   --  2.2  --     GFR: Estimated Creatinine Clearance: 26.6 mL/min (A) (by C-G formula based on SCr of 1.51 mg/dL (H)). Liver Function Tests: Recent Labs  Lab 11/06/21 0255 11/08/21 0053  AST 23 20  ALT 19 22  ALKPHOS 64 70  BILITOT 0.4 0.3  PROT 7.2 7.0  ALBUMIN 2.7* 2.8*     Recent Labs  Lab 11/04/21 0957  AMMONIA 20    CBG: Recent Labs  Lab 11/07/21 1621 11/07/21 2052  11/08/21 0029 11/08/21 0410 11/08/21 0809  GLUCAP 85 91 122* 94 97      Recent Results (from the past 240 hour(s))  Culture, Respiratory w Gram Stain     Status: None   Collection Time: 10/31/21 11:03 AM   Specimen: Tracheal Aspirate; Respiratory  Result Value Ref Range Status   Specimen Description TRACHEAL ASPIRATE  Final   Special Requests NONE  Final   Gram Stain   Final    RARE SQUAMOUS EPITHELIAL CELLS PRESENT ABUNDANT WBC PRESENT,BOTH PMN AND MONONUCLEAR FEW GRAM POSITIVE COCCI    Culture   Final    RARE Consistent with normal respiratory flora. No Pseudomonas species isolated Performed at [facility] [address], [place], [place] [address]    Report Status 11/02/2021 FINAL  Final          Radiology Studies: No results found.      Scheduled Meds:  amLODipine  10 mg Per Tube  Daily   chlordiazePOXIDE  25 mg Per Tube Q12H   chlorhexidine gluconate (MEDLINE KIT)  15 mL Mouth Rinse BID   Chlorhexidine Gluconate Cloth  6 each Topical Daily   docusate  100 mg Per Tube BID   folic acid  1 mg Per Tube Daily   free water  150 mL Per Tube Q4H   heparin  5,000 Units Subcutaneous Q8H   insulin aspart  0-15 Units Subcutaneous Q4H   lactulose  10 g Per Tube BID   mouth rinse  15 mL Mouth Rinse QID   multivitamin with minerals  1 tablet Per Tube Daily   pantoprazole sodium  40 mg Per Tube BID   phenobarbital  32.4 mg Per Tube BID   polyethylene glycol  17 g Per Tube Daily   QUEtiapine  50 mg Per Tube BID   [START ON 11/09/2021] thiamine injection  100 mg Intravenous Daily   Continuous Infusions:  sodium chloride 10 mL/hr at 10/29/21 1800   feeding supplement (OSMOLITE 1.5 CAL) 1,000 mL (11/08/21 0120)     LOS: 14 days     Bonnielee Haff, MD [facility]   If 7PM-7AM, please contact night-coverage [email or web address]  11/08/2021, 10:41 AM

## 2021-11-08 NOTE — Plan of Care (Signed)
°  Problem: Safety: Goal: Non-violent Restraint(s) Outcome: Progressing   Problem: Education: Goal: Knowledge of General Education information will improve Description: Including pain rating scale, medication(s)/side effects and non-pharmacologic comfort measures Outcome: Progressing

## 2021-11-08 NOTE — Plan of Care (Signed)
°  Problem: Safety: Goal: Non-violent Restraint(s) 11/08/2021 1718 by Emmaline Life, RN Outcome: Progressing 11/08/2021 1718 by Emmaline Life, RN Outcome: Progressing   Problem: Education: Goal: Knowledge of General Education information will improve Description: Including pain rating scale, medication(s)/side effects and non-pharmacologic comfort measures 11/08/2021 1718 by Emmaline Life, RN Outcome: Progressing 11/08/2021 1718 by Emmaline Life, RN Outcome: Progressing

## 2021-11-08 NOTE — Progress Notes (Signed)
Physical Therapy Treatment Patient Details Name: Sherry Baker MRN: [id number] DOB: 11-06-1965 Today's Date: 11/08/2021   History of Present Illness 56 yo admitted 12/22 with AMS and encephalopathy. Pt with aspiration requiring intubation 12/28. Extubated 12/30. PMhx: ETOH cirrhosis, polysubstance abuse, bipolar, CHF, CKD, HTn, anxiety, rectal CA s/p ostomy    PT Comments    Pt remains unable or reluctant to follow commands at this time. Pt is agitated and combative at times during session, very difficult to understand. Pt demonstrates fair strength in all extremities, however remains at a high risk for falls due to significantly impaired cognition. Pt will benefit from continued acute PT services in an effort to improve mobility and reduce falls risk, however if the pt remains unable to consistently follow commands PT may sign off.   Recommendations for follow up therapy are one component of a multi-disciplinary discharge planning process, led by the attending physician.  Recommendations may be updated based on patient status, additional functional criteria and insurance authorization.  Follow Up Recommendations  Skilled nursing-short term rehab (<3 hours/day)     Assistance Recommended at Discharge Frequent or constant Supervision/Assistance  Patient can return home with the following Two people to help with walking and/or transfers;Two people to help with bathing/dressing/bathroom;Direct supervision/assist for medications management;Direct supervision/assist for financial management;Assistance with feeding;Assistance with cooking/housework;Assist for transportation;Help with stairs or ramp for entrance   Equipment Recommendations  Wheelchair (measurements PT);Wheelchair cushion (measurements PT);BSC/3in1    Recommendations for Other Services       Precautions / Restrictions Precautions Precautions: Fall Precaution Comments: bilateral wrist restraints, mittens, cortrack,  colostomy Restrictions Weight Bearing Restrictions: No     Mobility  Bed Mobility Overal bed mobility: Needs Assistance Bed Mobility: Supine to Sit;Sit to Supine     Supine to sit: Total assist Sit to supine: Mod assist   General bed mobility comments: pt requiring totalA to sit at edge of bed, resistant to knee extension. Pt more agreeable to returning to supine, requires assistance to reposisiton in bed    Transfers Overall transfer level: Needs assistance Equipment used: 1 person hand held assist Transfers: Sit to/from Stand Sit to Stand: Max assist           General transfer comment: PT initiates sit to stand, pt follows through with BLE extension, very narrow BOS    Ambulation/Gait                   Stairs             Wheelchair Mobility    Modified Rankin (Stroke Patients Only)       Balance Overall balance assessment: Needs assistance Sitting-balance support: No upper extremity supported;Feet supported Sitting balance-Leahy Scale: Poor Sitting balance - Comments: min-modA due to posterior lean initially, progresses to minG Postural control: Posterior lean Standing balance support: Bilateral upper extremity supported Standing balance-Leahy Scale: Poor Standing balance comment: modA                            Cognition Arousal/Alertness: Awake/alert;Lethargic (lethargic initially, awakens with repeated stimuli) Behavior During Therapy: Restless;Agitated Overall Cognitive Status: Difficult to assess                                 General Comments: pt is difficult to understand for most of session. Pt does not follow commands and is often resistant to PT attempts at  mobility. Pt combative at times during session        Exercises      General Comments General comments (skin integrity, edema, etc.): VSS on RA      Pertinent Vitals/Pain Pain Assessment: Faces Faces Pain Scale: Hurts little more Pain  Location: pt unable to indicate Pain Descriptors / Indicators: Grimacing Pain Intervention(s): Monitored during session    Home Living                          Prior Function            PT Goals (current goals can now be found in the care plan section) Acute Rehab PT Goals Patient Stated Goal: pt does not state a PT related goal Progress towards PT goals: Not progressing toward goals - comment (limited by impaired cognition and agitation)    Frequency    Min 2X/week      PT Plan Current plan remains appropriate;Frequency needs to be updated    Co-evaluation              AM-PAC PT "6 Clicks" Mobility   Outcome Measure  Help needed turning from your back to your side while in a flat bed without using bedrails?: A Lot Help needed moving from lying on your back to sitting on the side of a flat bed without using bedrails?: Total Help needed moving to and from a bed to a chair (including a wheelchair)?: Total Help needed standing up from a chair using your arms (e.g., wheelchair or bedside chair)?: A Lot Help needed to walk in hospital room?: Total Help needed climbing 3-5 steps with a railing? : Total 6 Click Score: 8    End of Session   Activity Tolerance: Treatment limited secondary to agitation Patient left: in bed;with call bell/phone within reach;with bed alarm set;with restraints reapplied Nurse Communication: Mobility status PT Visit Diagnosis: Other abnormalities of gait and mobility (R26.89);Difficulty in walking, not elsewhere classified (R26.2)     Time: 6754-4920 PT Time Calculation (min) (ACUTE ONLY): 18 min  Charges:  $Therapeutic Activity: 8-22 mins                     Zenaida Niece, PT, DPT Acute Rehabilitation Pager: [phone] Office [place] Sherry Baker 11/08/2021, 2:57 PM

## 2021-11-09 DIAGNOSIS — I10 Essential (primary) hypertension: Secondary | ICD-10-CM | POA: Diagnosis not present

## 2021-11-09 DIAGNOSIS — G9340 Encephalopathy, unspecified: Secondary | ICD-10-CM | POA: Diagnosis not present

## 2021-11-09 DIAGNOSIS — F1020 Alcohol dependence, uncomplicated: Secondary | ICD-10-CM | POA: Diagnosis not present

## 2021-11-09 LAB — GLUCOSE, CAPILLARY
Glucose-Capillary: 110 mg/dL — ABNORMAL HIGH (ref 70–99)
Glucose-Capillary: 117 mg/dL — ABNORMAL HIGH (ref 70–99)
Glucose-Capillary: 120 mg/dL — ABNORMAL HIGH (ref 70–99)
Glucose-Capillary: 125 mg/dL — ABNORMAL HIGH (ref 70–99)
Glucose-Capillary: 80 mg/dL (ref 70–99)
Glucose-Capillary: 90 mg/dL (ref 70–99)
Glucose-Capillary: 92 mg/dL (ref 70–99)

## 2021-11-09 MED ORDER — QUETIAPINE FUMARATE 100 MG PO TABS
100.0000 mg | ORAL_TABLET | Freq: Two times a day (BID) | ORAL | Status: DC
  Administered 2021-11-09 – 2021-11-14 (×10): 100 mg
  Filled 2021-11-09 (×10): qty 1

## 2021-11-09 MED ORDER — ADULT MULTIVITAMIN LIQUID CH
15.0000 mL | Freq: Every day | ORAL | Status: DC
  Administered 2021-11-09 – 2021-11-14 (×6): 15 mL
  Filled 2021-11-09 (×6): qty 15

## 2021-11-09 NOTE — Progress Notes (Addendum)
PROGRESS NOTE    Sherry Baker  YPP:[id number] DOB: 13-Apr-1966 DOA: 10/24/2021 PCP: Pcp, No   Brief Narrative: 56 year old with past medical history significant for alcohol dependence, alcoholic liver cirrhosis and polysubstance abuse was brought into the emergency department 12/22 with altered mental status.  She was found to be agitated, screaming with work-up positive for cocaine and cannabis on UDS, UA was positive for probably UTI.  She was admitted to hospitalist service for further evaluation, but she still required more Ativan for delirium remained encephalopathic.  PCCM was consulted.  Patient was a started on Precedex infusion, Librium taper, CIWA Wace Ativan.  On 12/28 patient aspirated, became hypoxic and was intubated.  She was noted to have glottic edema.  Unasyn was started.  She received Solu-Medrol for glottic edema.  Subsequently she was extubated on 12/30.  She remain encephalopathic, weaning medications for alcohol withdrawal.    Assessment & Plan:   Acute Toxic Metabolic Encephalopathy Patient went through severe alcohol withdrawal.  Also has history of polysubstance abuse.   CT head did not show any acute findings. Librium and phenobarbital being weaned off gradually.   Ammonia level was normal. On Seroquel as well.  We will increase the dose of Seroquel. Clonidine has been weaned off. Also received 3 days of high-dose thiamine.  Currently on thiamine 100 mg once a day. Remains quite emotionally labile.  Remains confused.  Unable to remove restraints. Recheck ammonia level tomorrow. Will need to consider consulting psychiatry to assist with management.  Acute Respiratory Failure secondary to aspiration pneumonia versus pneumonitis: -Aspirated due to dysphagia.  Glottic edema at the time of intubation. -Continue with pulmonary hygiene, mobilize as able -Completed 5 days of Unasyn.  Respiratory status is stable.  Saturating normal on room air.  Dysphagia/ Severe  malnutrition.  Continue with tube feeding  Speech therapy is following.  Approved for dysphagia 1 diet.  Aspiration precautions.  Continue to monitor.  CKD stage III A Creatinine stable for the most part.  Baseline appears to be around 1.3-1.4.  Check intermittently. Monitor urine output     Alcoholic liver cirrhosis  Ammonia normal. Continue with lactulose.   Chronic diastolic CHF/Essential hypertension  Occasional high BP readings noted.  Remains on amlodipine.  Was on carvedilol as well.  May need to reintroduce if blood pressure trends remain elevated.  Her agitation is likely contributing as well.  Clonidine was weaned off.   Volume status seems to be stable.  Hyperglycemia Secondary to steroids.  Now stable.  Not noted to be on any steroids currently. SSI tube feeding coverage.   History of Rectal cancer s/p chemotherapy, status post colostomy bag Ostomy care per protocol.   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 Subcutaneous fat may be visible but bone, tendon or muscle are NOT exposed. (Active)  10/25/21 1421  Location: Sacrum  Location Orientation: Medial  Staging: Stage 3 -  Full thickness tissue loss. Subcutaneous fat may be visible but bone, tendon or muscle are NOT exposed.  Wound Description (Comments):   Present on Admission: Yes    Severe protein calorie malnutrition Nutrition Problem: Severe Malnutrition Etiology: chronic illness (cirrhosis, polysubstance abuse) Signs/Symptoms: severe muscle depletion, severe fat depletion Interventions: MVI, Tube feeding, Hormel Shake   DVT prophylaxis: Subcutaneous Heparin  Code Status: Full code Family Communication: No family at bedside Disposition Plan: SNF is recommended currently  Status is: Inpatient  Remains inpatient  appropriate because: remain for encephalopathy requiring tube feeding     Consultants:   CCM   Procedures:  Intubation 12/28---extubation 12/30  Antimicrobials:    Subjective: Patient is crying.  Wants to come off of restraints.  She wants to go home.  Not answering questions appropriately.   Objective: Vitals:   11/09/21 0400 11/09/21 0409 11/09/21 0821 11/09/21 0856  BP: (!) 149/89  (!) 153/98 (!) 152/90  Pulse: (!) 107  100   Resp: 19  18   Temp: 98 F (36.7 C)  98.1 F (36.7 C)   TempSrc: Axillary  Oral   SpO2: 96%  98%   Weight:  40.3 kg    Height:        Intake/Output Summary (Last 24 hours) at 11/09/2021 1049 Last data filed at 11/09/2021 0832 Gross per 24 hour  Intake 1980 ml  Output --  Net 1980 ml    Filed Weights   11/07/21 0525 11/08/21 0527 11/09/21 0409  Weight: 41.1 kg 39.5 kg 40.3 kg    Examination:  General appearance: Awake alert.  Emotional.  Agitated at times. Resp: Clear to auscultation bilaterally.  Normal effort Cardio: S1-S2 is normal regular.  No S3-S4.  No rubs murmurs or bruit GI: Abdomen is soft.  Nontender nondistended.  Bowel sounds are present normal.  No masses organomegaly.  Ostomy is noted. Extremities: Moving all of her extremities Neurologic:  No focal neurological deficits.      Data Reviewed: I have personally reviewed following labs and imaging studies  CBC: Recent Labs  Lab 11/04/21 0206 11/05/21 0827 11/06/21 0255 11/08/21 0053  WBC 6.3 7.5 7.0 5.9  NEUTROABS 4.4  --   --   --   HGB 11.0* 12.8 12.1 11.1*  HCT 34.4* 40.5 37.3 34.9*  MCV 89.8 89.2 88.8 87.9  PLT 305 365 345 436    Basic Metabolic Panel: Recent Labs  Lab 11/03/21 0159 11/04/21 0206 11/05/21 0827 11/06/21 0255 11/08/21 0053  NA 138 137 134* 134* 135  K 4.4 4.7 5.1 4.6 4.3  CL 101 101 97* 103 101  CO2 '26 27 24 ' 21* 24  GLUCOSE 86 103* 95 121* 96  BUN 21* 24* 29* 33* 39*  CREATININE 1.30* 1.44* 1.48* 1.45* 1.51*  CALCIUM 9.1 9.1 9.5 9.2 9.4  MG  --   --   --  2.2  --     GFR: Estimated Creatinine Clearance: 27.1  mL/min (A) (by C-G formula based on SCr of 1.51 mg/dL (H)).  Liver Function Tests: Recent Labs  Lab 11/06/21 0255 11/08/21 0053  AST 23 20  ALT 19 22  ALKPHOS 64 70  BILITOT 0.4 0.3  PROT 7.2 7.0  ALBUMIN 2.7* 2.8*     Recent Labs  Lab 11/04/21 0957  AMMONIA 20    CBG: Recent Labs  Lab 11/08/21 1623 11/08/21 2052 11/09/21 0006 11/09/21 0408 11/09/21 0828  GLUCAP 111* 92 117* 90 92      Recent Results (from the past 240 hour(s))  Culture, Respiratory w Gram Stain     Status: None   Collection Time: 10/31/21 11:03 AM   Specimen: Tracheal Aspirate; Respiratory  Result Value Ref Range Status   Specimen Description TRACHEAL ASPIRATE  Final   Special Requests NONE  Final   Gram Stain   Final    RARE SQUAMOUS EPITHELIAL CELLS PRESENT ABUNDANT WBC PRESENT,BOTH PMN AND MONONUCLEAR FEW GRAM POSITIVE COCCI    Culture   Final    RARE Consistent with  normal respiratory flora. No Pseudomonas species isolated Performed at [facility] [address], [place], [place] [address]    Report Status 11/02/2021 FINAL  Final          Radiology Studies: No results found.   Scheduled Meds:  amLODipine  10 mg Per Tube Daily   chlordiazePOXIDE  25 mg Per Tube Q12H   chlorhexidine gluconate (MEDLINE KIT)  15 mL Mouth Rinse BID   Chlorhexidine Gluconate Cloth  6 each Topical Daily   docusate  100 mg Per Tube BID   folic acid  1 mg Per Tube Daily   free water  150 mL Per Tube Q4H   heparin  5,000 Units Subcutaneous Q8H   insulin aspart  0-15 Units Subcutaneous Q4H   lactulose  10 g Per Tube BID   mouth rinse  15 mL Mouth Rinse QID   multivitamin  15 mL Per Tube Daily   pantoprazole sodium  40 mg Per Tube BID   phenobarbital  32.4 mg Per Tube BID   polyethylene glycol  17 g Per Tube Daily   QUEtiapine  50 mg Per Tube BID   thiamine injection  100 mg Intravenous Daily   Continuous Infusions:  sodium chloride 10 mL/hr at 10/29/21 1800   feeding supplement  (OSMOLITE 1.5 CAL) 55 mL/hr at 11/09/21 0700     LOS: 15 days     Bonnielee Haff, MD [facility]   If 7PM-7AM, please contact night-coverage [email or web address]  11/09/2021, 10:49 AM

## 2021-11-09 NOTE — Progress Notes (Signed)
Dressing changed on wounds in peri-anal area. There appears to have been an advancement in the peri-anal / sacral wound which appears to be more of a St IV, since there is visible evidence of musculature at the wound base.  Wound is a non-healing wound as patient remains incontinent of urine and the area is often saturated with urine, inhibiting healing.  MD aware.

## 2021-11-10 DIAGNOSIS — I10 Essential (primary) hypertension: Secondary | ICD-10-CM | POA: Diagnosis not present

## 2021-11-10 DIAGNOSIS — G9340 Encephalopathy, unspecified: Secondary | ICD-10-CM | POA: Diagnosis not present

## 2021-11-10 DIAGNOSIS — F1020 Alcohol dependence, uncomplicated: Secondary | ICD-10-CM | POA: Diagnosis not present

## 2021-11-10 LAB — CBC
HCT: 32.6 % — ABNORMAL LOW (ref 36.0–46.0)
Hemoglobin: 10.3 g/dL — ABNORMAL LOW (ref 12.0–15.0)
MCH: 27.9 pg (ref 26.0–34.0)
MCHC: 31.6 g/dL (ref 30.0–36.0)
MCV: 88.3 fL (ref 80.0–100.0)
Platelets: 301 K/uL (ref 150–400)
RBC: 3.69 MIL/uL — ABNORMAL LOW (ref 3.87–5.11)
RDW: 15.9 % — ABNORMAL HIGH (ref 11.5–15.5)
WBC: 5.3 K/uL (ref 4.0–10.5)
nRBC: 0 % (ref 0.0–0.2)

## 2021-11-10 LAB — COMPREHENSIVE METABOLIC PANEL
ALT: 20 U/L (ref 0–44)
AST: 19 U/L (ref 15–41)
Albumin: 2.8 g/dL — ABNORMAL LOW (ref 3.5–5.0)
Alkaline Phosphatase: 69 U/L (ref 38–126)
Anion gap: 12 (ref 5–15)
BUN: 42 mg/dL — ABNORMAL HIGH (ref 6–20)
CO2: 24 mmol/L (ref 22–32)
Calcium: 9.7 mg/dL (ref 8.9–10.3)
Chloride: 101 mmol/L (ref 98–111)
Creatinine, Ser: 1.6 mg/dL — ABNORMAL HIGH (ref 0.44–1.00)
GFR, Estimated: 38 mL/min — ABNORMAL LOW (ref >60)
Glucose, Bld: 140 mg/dL — ABNORMAL HIGH (ref 70–99)
Potassium: 3.7 mmol/L (ref 3.5–5.1)
Sodium: 137 mmol/L (ref 135–145)
Total Bilirubin: 0.4 mg/dL (ref 0.3–1.2)
Total Protein: 7 g/dL (ref 6.5–8.1)

## 2021-11-10 LAB — MAGNESIUM: Magnesium: 2.3 mg/dL (ref 1.7–2.4)

## 2021-11-10 LAB — GLUCOSE, CAPILLARY
Glucose-Capillary: 87 mg/dL (ref 70–99)
Glucose-Capillary: 92 mg/dL (ref 70–99)
Glucose-Capillary: 114 mg/dL — ABNORMAL HIGH (ref 70–99)
Glucose-Capillary: 77 mg/dL (ref 70–99)
Glucose-Capillary: 82 mg/dL (ref 70–99)

## 2021-11-10 LAB — AMMONIA: Ammonia: 24 umol/L (ref 9–35)

## 2021-11-10 MED ORDER — FREE WATER
250.0000 mL | Status: DC
  Administered 2021-11-10 – 2021-11-14 (×26): 250 mL

## 2021-11-10 NOTE — Plan of Care (Signed)

## 2021-11-10 NOTE — Progress Notes (Signed)
Speech Language Pathology Treatment: Dysphagia  Patient Details Name: Sherry Baker MRN: [id number] DOB: 03/17/1966 Today's Date: 11/10/2021 Time: 5093-2671 SLP Time Calculation (min) (ACUTE ONLY): 15 min  Assessment / Plan / Recommendation Clinical Impression  Pt was seen for dysphagia treatment. She was alert and cooperative during the session with improved mentation compared to when she was last seen on 1/6. Pt tolerated dysphagia 3 solids, purees, and thin liquids via straw when swallowing precautions were observed. However, she frequently attempted to speak while masticating large boluses of dysphagia 3 solids or dual consistency boluses of peaches with juice, and she demonstrated coughing thereafter. Aspiration secondary to premature spillage is suspected. Mastication was [facility] and oral clearance was adequate. Pt's diet will be advanced to dysphagia 2 and thin liquids, but supervision will still be needed. SLP will continue to follow pt.    HPI HPI: 56 yo admitted 12/22 with AMS and encephalopathy. Pt with aspiration requiring intubation 12/28. Extubated 12/30. Notes indicate she had glottic edema at time of intubation. PMhx: ETOH cirrhosis, polysubstance abuse, bipolar, CHF, CKD, HTn, anxiety, rectal CA s/p ostomy. MBSS completed in Nov, which was WNL.      SLP Plan  Continue with current plan of care      Recommendations for follow up therapy are one component of a multi-disciplinary discharge planning process, led by the attending physician.  Recommendations may be updated based on patient status, additional functional criteria and insurance authorization.    Recommendations  Diet recommendations: Dysphagia 2 (fine chop);Thin liquid Liquids provided via: Cup;Straw Medication Administration: Via alternative means (or crushed) Supervision: Full supervision/cueing for compensatory strategies Compensations: Minimize environmental distractions;Small sips/bites;Slow rate Postural Changes  and/or Swallow Maneuvers: Seated upright 90 degrees                Oral Care Recommendations: Oral care BID Follow Up Recommendations: No SLP follow up Assistance recommended at discharge: Frequent or constant Supervision/Assistance SLP Visit Diagnosis: Dysphagia, oropharyngeal phase (R13.12) Plan: Continue with current plan of care         Tracyann Duffell I. Hardin Negus, [place], [place] Office number [phone] Pager [place]  11/10/2021, 5:13 PM

## 2021-11-10 NOTE — Progress Notes (Signed)
PROGRESS NOTE    Sherry Baker  [id number]:[id number] DOB: 1966-08-21 DOA: 10/24/2021 PCP: Pcp, No   Brief Narrative: 56 year old with past medical history significant for alcohol dependence, alcoholic liver cirrhosis and polysubstance abuse was brought into the emergency department 12/22 with altered mental status.  She was found to be agitated, screaming with work-up positive for cocaine and cannabis on UDS, UA was positive for probably UTI.  She was admitted to hospitalist service for further evaluation, but she still required more Ativan for delirium remained encephalopathic.  PCCM was consulted.  Patient was a started on Precedex infusion, Librium taper, CIWA Wace Ativan.  On 12/28 patient aspirated, became hypoxic and was intubated.  She was noted to have glottic edema.  Unasyn was started.  She received Solu-Medrol for glottic edema.  Subsequently she was extubated on 12/30.    Assessment & Plan:   Acute Toxic Metabolic Encephalopathy Patient went through severe alcohol withdrawal.  Also has history of polysubstance abuse.   CT head did not show any acute findings. Librium and phenobarbital being weaned off gradually.   Ammonia level was normal. Patient's dose of Seroquel was increased on 1/7. Clonidine has been weaned off. Also received 3 days of high-dose thiamine.  Currently on thiamine 100 mg once a day. Mentation seems to be slightly better today compared to yesterday though she remains emotionally labile.   Ammonia level noted to be normal today at 24. Continue to monitor.  If there is no significant improvement in the next 24 hours we will consult psychiatry to assist with management. Tachycardia is likely due to her agitation.  Acute Respiratory Failure secondary to aspiration pneumonia versus pneumonitis: -Aspirated due to dysphagia.  Glottic edema at the time of intubation. -Continue with pulmonary hygiene, mobilize as able -Completed 5 days of Unasyn.  Respiratory status is  stable.  Saturating normal on room air.  Dysphagia/ Severe malnutrition.  Continue with tube feeding  Speech therapy is following.  Approved for dysphagia 1 diet.  Aspiration precautions.  Continue to monitor.  CKD stage III A Creatinine stable for the most part.  Baseline appears to be around 1.3-1.4.  Check intermittently. Monitor urine output     Alcoholic liver cirrhosis  Ammonia normal. Continue with lactulose.   Chronic diastolic CHF/Essential hypertension  Occasional high BP readings noted.  Remains on amlodipine.   Was on carvedilol as well.  Since heart rate noted to be elevated and blood pressure is also high we will reintroduce carvedilol.   Agitation is also contributing to her elevated heart rate. Volume status is stable.    Hyperglycemia Secondary to steroids.  Now stable.  Not noted to be on any steroids currently. SSI tube feeding coverage.   Normocytic anemia No evidence of overt bleeding.  Anemia panel was checked recently and noted to be unremarkable.  History of Rectal cancer s/p chemotherapy, status post colostomy bag Ostomy care per protocol.   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 Subcutaneous fat may be visible but bone, tendon or muscle are NOT exposed. (Active)  10/25/21 1421  Location: Sacrum  Location Orientation: Medial  Staging: Stage 3 -  Full thickness tissue loss. Subcutaneous fat may be visible but bone, tendon or muscle are NOT exposed.  Wound Description (Comments):   Present on Admission: Yes    Severe protein calorie malnutrition Nutrition Problem: Severe Malnutrition Etiology: chronic illness (cirrhosis, polysubstance abuse) Signs/Symptoms: severe muscle  depletion, severe fat depletion Interventions: MVI, Tube feeding, Hormel Shake   DVT prophylaxis: Subcutaneous Heparin  Code Status: Full code Family Communication: No family at  bedside Disposition Plan: SNF is recommended currently  Status is: Inpatient  Remains inpatient appropriate because: remain for encephalopathy requiring tube feeding     Consultants:  CCM   Procedures:  Intubation 12/28---extubation 12/30  Antimicrobials:    Subjective: Emotionally labile.  Wants to come off of restraints.     Objective: Vitals:   11/09/21 1620 11/10/21 0000 11/10/21 0308 11/10/21 0729  BP: (!) 146/87 (!) 144/88 127/75 (!) 164/93  Pulse: 89 100 100 (!) 115  Resp: _0 Temp: 98 F (36.7 C) 98.2 F (36.8 C) 98.3 F (36.8 C) 99 F (37.2 C)  TempSrc: Oral Axillary Axillary Oral  SpO2: 90% 100% 96%   Weight:      Height:        Intake/Output Summary (Last 24 hours) at 11/10/2021 1048 Last data filed at 11/09/2021 1900 Gross per 24 hour  Intake 1140 ml  Output --  Net 1140 ml    Filed Weights   11/07/21 0525 11/08/21 0527 11/09/21 0409  Weight: 41.1 kg 39.5 kg 40.3 kg    Examination:  General appearance: Awake alert.  In no distress.  Emotional Resp: Clear to auscultation bilaterally.  Normal effort Cardio: S1-S2 is tachycardic regular.  No S3-S4.  No rubs or bruit. GI: Abdomen is soft.  Nontender nondistended.  Bowel sounds are present normal.  No masses organomegaly Extremities: Moving all of her extremities Neurologic: No focal neurological deficits.     Data Reviewed: I have personally reviewed following labs and imaging studies  CBC: Recent Labs  Lab 11/04/21 0206 11/05/21 0827 11/06/21 0255 11/08/21 0053 11/10/21 0013  WBC 6.3 7.5 7.0 5.9 5.3  NEUTROABS 4.4  --   --   --   --   HGB 11.0* 12.8 12.1 11.1* 10.3*  HCT 34.4* 40.5 37.3 34.9* 32.6*  MCV 89.8 89.2 88.8 87.9 88.3  PLT 305 365 345 349 515    Basic Metabolic Panel: Recent Labs  Lab 11/04/21 0206 11/05/21 0827 11/06/21 0255 11/08/21 0053 11/10/21 0013  NA 137 134* 134* 135 137  K 4.7 5.1 4.6 4.3 3.7  CL 101 97* 103 101 101  CO2 27 24 21* 24 24   GLUCOSE 103* 95 121* 96 140*  BUN 24* 29* 33* 39* 42*  CREATININE 1.44* 1.48* 1.45* 1.51* 1.60*  CALCIUM 9.1 9.5 9.2 9.4 9.7  MG  --   --  2.2  --  2.3    GFR: Estimated Creatinine Clearance: 25.6 mL/min (A) (by C-G formula based on SCr of 1.6 mg/dL (H)).  Liver Function Tests: Recent Labs  Lab 11/06/21 0255 11/08/21 0053 11/10/21 0013  AST _1 ALT _2 ALKPHOS 64 70 69  BILITOT 0.4 0.3 0.4  PROT 7.2 7.0 7.0  ALBUMIN 2.7* 2.8* 2.8*     Recent Labs  Lab 11/04/21 0957 11/10/21 0013  AMMONIA 20 24    CBG: Recent Labs  Lab 11/09/21 1603 11/09/21 2030 11/09/21 2346 11/10/21 0507 11/10/21 0728  GLUCAP 125* 80 120* 87 92      Recent Results (from the past 240 hour(s))  Culture, Respiratory w Gram Stain     Status: None   Collection Time: 10/31/21 11:03 AM   Specimen: Tracheal Aspirate; Respiratory  Result Value Ref Range Status   Specimen Description TRACHEAL ASPIRATE  Final   Special Requests NONE  Final   Gram Stain   Final    RARE SQUAMOUS EPITHELIAL CELLS PRESENT ABUNDANT WBC PRESENT,BOTH PMN AND MONONUCLEAR FEW GRAM POSITIVE COCCI    Culture   Final    RARE Consistent with normal respiratory flora. No Pseudomonas species isolated Performed at [facility] [address], [place], [place] [address]    Report Status 11/02/2021 FINAL  Final          Radiology Studies: No results found.   Scheduled Meds:  amLODipine  10 mg Per Tube Daily   chlordiazePOXIDE  25 mg Per Tube Q12H   chlorhexidine gluconate (MEDLINE KIT)  15 mL Mouth Rinse BID   Chlorhexidine Gluconate Cloth  6 each Topical Daily   docusate  100 mg Per Tube BID   folic acid  1 mg Per Tube Daily   free water  250 mL Per Tube Q4H   heparin  5,000 Units Subcutaneous Q8H   insulin aspart  0-15 Units Subcutaneous Q4H   lactulose  10 g Per Tube BID   mouth rinse  15 mL Mouth Rinse QID   multivitamin  15 mL Per Tube Daily   pantoprazole sodium  40 mg Per Tube  BID   phenobarbital  32.4 mg Per Tube BID   polyethylene glycol  17 g Per Tube Daily   QUEtiapine  100 mg Per Tube BID   thiamine injection  100 mg Intravenous Daily   Continuous Infusions:  sodium chloride 10 mL/hr at 10/29/21 1800   feeding supplement (OSMOLITE 1.5 CAL) 1,000 mL (11/10/21 0930)     LOS: 16 days     Bonnielee Haff, MD [facility]   If 7PM-7AM, please contact night-coverage [email or web address]  11/10/2021, 10:48 AM

## 2021-11-11 DIAGNOSIS — G9340 Encephalopathy, unspecified: Secondary | ICD-10-CM | POA: Diagnosis not present

## 2021-11-11 DIAGNOSIS — F1020 Alcohol dependence, uncomplicated: Secondary | ICD-10-CM | POA: Diagnosis not present

## 2021-11-11 DIAGNOSIS — I10 Essential (primary) hypertension: Secondary | ICD-10-CM | POA: Diagnosis not present

## 2021-11-11 LAB — GLUCOSE, CAPILLARY
Glucose-Capillary: 100 mg/dL — ABNORMAL HIGH (ref 70–99)
Glucose-Capillary: 104 mg/dL — ABNORMAL HIGH (ref 70–99)
Glucose-Capillary: 72 mg/dL (ref 70–99)
Glucose-Capillary: 85 mg/dL (ref 70–99)
Glucose-Capillary: 87 mg/dL (ref 70–99)
Glucose-Capillary: 94 mg/dL (ref 70–99)

## 2021-11-11 MED ORDER — AMLODIPINE BESYLATE 5 MG PO TABS: 5.0000 mg | ORAL_TABLET | Freq: Every day | ORAL | Status: DC

## 2021-11-11 MED ORDER — CARVEDILOL 3.125 MG PO TABS: 3.1250 mg | ORAL_TABLET | Freq: Two times a day (BID) | ORAL | Status: DC

## 2021-11-11 MED ORDER — THIAMINE HCL 100 MG PO TABS
100.0000 mg | ORAL_TABLET | Freq: Every day | ORAL | Status: DC
  Administered 2021-11-11 – 2021-11-15 (×5): 100 mg
  Filled 2021-11-11 (×5): qty 1

## 2021-11-11 MED ORDER — CARVEDILOL 3.125 MG PO TABS
3.1250 mg | ORAL_TABLET | Freq: Two times a day (BID) | ORAL | Status: DC
  Administered 2021-11-11 – 2021-11-14 (×6): 3.125 mg
  Filled 2021-11-11 (×6): qty 1

## 2021-11-11 NOTE — Plan of Care (Signed)
Problem: Health Behavior/Discharge Planning: Goal: Ability to manage health-related needs will improve Outcome: Progressing   Problem: Clinical Measurements: Goal: Ability to maintain clinical measurements within normal limits will improve Outcome: Progressing Goal: Will remain free from infection Outcome: Progressing Goal: Diagnostic test results will improve Outcome: Progressing Goal: Respiratory complications will improve Outcome: Progressing Goal: Cardiovascular complication will be avoided Outcome: Progressing   Problem: Activity: Goal: Risk for activity intolerance will decrease Outcome: Progressing   Problem: Nutrition: Goal: Adequate nutrition will be maintained Outcome: Progressing   Problem: Coping: Goal: Level of anxiety will decrease Outcome: Progressing   Problem: Elimination: Goal: Will not experience complications related to bowel motility Outcome: Progressing Goal: Will not experience complications related to urinary retention Outcome: Progressing   Problem: Pain Managment: Goal: General experience of comfort will improve Outcome: Progressing   Problem: Safety: Goal: Ability to remain free from injury will improve Outcome: Progressing   Problem: Skin Integrity: Goal: Risk for impaired skin integrity will decrease Outcome: Progressing   Problem: Education: Goal: Knowledge of General Education information will improve Description: Including pain rating scale, medication(s)/side effects and non-pharmacologic comfort measures Outcome: Not Progressing Note: Remains confused and reoriented.   Problem: Safety: Goal: Non-violent Restraint(s) Outcome: Completed/Met

## 2021-11-11 NOTE — Progress Notes (Signed)
PROGRESS NOTE    Sherry Baker  PFX:[id number] DOB: [birth year] DOA: 10/24/2021 PCP: Pcp, No   Brief Narrative: 56 year old with past medical history significant for alcohol dependence, alcoholic liver cirrhosis and polysubstance abuse was brought into the emergency department 12/22 with altered mental status.  She was found to be agitated, screaming with work-up positive for cocaine and cannabis on UDS, UA was positive for probably UTI.  She was admitted to hospitalist service for further evaluation, but she still required more Ativan for delirium remained encephalopathic.  PCCM was consulted.  Patient was a started on Precedex infusion, Librium taper, CIWA Wace Ativan.  On 12/28 patient aspirated, became hypoxic and was intubated.  She was noted to have glottic edema.  Unasyn was started.  She received Solu-Medrol for glottic edema.  Subsequently she was extubated on 12/30.    Assessment & Plan:   Acute Toxic Metabolic Encephalopathy Patient went through severe alcohol withdrawal.  Also has history of polysubstance abuse.   CT head did not show any acute findings. Librium and phenobarbital being weaned off gradually.   Ammonia level was normal. Patient's dose of Seroquel was increased on 1/7. Clonidine has been weaned off. Also received 3 days of high-dose thiamine.  Currently on thiamine 100 mg once a day. Ammonia level was normal on 1/8. Mentation seems to be gradually improving.  Not as emotional today as yesterday.  Continue to monitor.  Will discuss with nursing staff to see if she be can take her restraints off for today and observe.  Tachycardia is likely due to episodes of agitation  Acute Respiratory Failure secondary to aspiration pneumonia versus pneumonitis: -Aspirated due to dysphagia.  Glottic edema at the time of intubation. -Continue with pulmonary hygiene, mobilize as able -Completed 5 days of Unasyn.  Respiratory status is stable.  Saturating normal on room  air.  Dysphagia/ Severe malnutrition.  Remains on tube feedings.  Speech therapy continues to follow.  Advance to dysphagia 2 diet.  Aspiration precautions.  CKD stage III A Creatinine stable for the most part.  Baseline appears to be around 1.3-1.4.  Creatinine was noted to be 1.6 yesterday.  Free water was increased through the feeding tube.  We will recheck labs tomorrow.  Monitor urine output.    Alcoholic liver cirrhosis  Ammonia normal. Continue with lactulose.   Chronic diastolic CHF/Essential hypertension  Occasional high BP readings noted.  Remains on amlodipine.   Carvedilol was on hold but has been resumed now. Agitation is also contributing to her elevated heart rate. Volume status is stable.    Hyperglycemia Secondary to steroids.  Now stable.  Not noted to be on any steroids currently. SSI tube feeding coverage.   Normocytic anemia No evidence of overt bleeding.  Anemia panel was checked recently and noted to be unremarkable.  History of Rectal cancer s/p chemotherapy, status post colostomy bag Ostomy care per protocol.   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 Subcutaneous fat may be visible but bone, tendon or muscle are NOT exposed. (Active)  10/25/21 1421  Location: Sacrum  Location Orientation: Medial  Staging: Stage 3 -  Full thickness tissue loss. Subcutaneous fat may be visible but bone, tendon or muscle are NOT exposed.  Wound Description (Comments):   Present on Admission: Yes    Severe protein calorie malnutrition Nutrition Problem: Severe Malnutrition Etiology: chronic illness (cirrhosis, polysubstance abuse) Signs/Symptoms: severe muscle depletion, severe fat  depletion Interventions: MVI, Tube feeding, Hormel Shake   DVT prophylaxis: Subcutaneous Heparin  Code Status: Full code Family Communication: No family at bedside Disposition Plan: SNF is recommended  currently  Status is: Inpatient  Remains inpatient appropriate because: remain for encephalopathy requiring tube feeding     Consultants:  CCM   Procedures:  Intubation 12/28---extubation 12/30  Antimicrobials:    Subjective: Mentation seems to be slightly better today.  Denies any pain issues.   Objective: Vitals:   11/11/21 0400 11/11/21 0520 11/11/21 0809 11/11/21 0932  BP: (!) 148/93  (!) 149/86 (!) 134/92  Pulse: 91  (!) 118 97  Resp: _0 Temp:  97.9 F (36.6 C) (!) 97.5 F (36.4 C) 98.2 F (36.8 C)  TempSrc:  Oral Oral Oral  SpO2: 97%  98% 96%  Weight:      Height:        Intake/Output Summary (Last 24 hours) at 11/11/2021 1046 Last data filed at 11/10/2021 1524 Gross per 24 hour  Intake 824.5 ml  Output --  Net 824.5 ml    Filed Weights   11/07/21 0525 11/08/21 0527 11/09/21 0409  Weight: 41.1 kg 39.5 kg 40.3 kg    Examination:  General appearance: Awake alert.  In no distress.  Less distracted. Resp: Clear to auscultation bilaterally.  Normal effort Cardio: S1-S2 is tachycardic regular.  No S3-S4. GI: Abdomen is soft.  Nontender nondistended.  Bowel sounds are present normal.  No masses organomegaly Extremities: No edema.  Moving all of her extremities Neurologic:  No focal neurological deficits.     Data Reviewed: I have personally reviewed following labs and imaging studies  CBC: Recent Labs  Lab 11/05/21 0827 11/06/21 0255 11/08/21 0053 11/10/21 0013  WBC 7.5 7.0 5.9 5.3  HGB 12.8 12.1 11.1* 10.3*  HCT 40.5 37.3 34.9* 32.6*  MCV 89.2 88.8 87.9 88.3  PLT 365 345 349 614    Basic Metabolic Panel: Recent Labs  Lab 11/05/21 0827 11/06/21 0255 11/08/21 0053 11/10/21 0013  NA 134* 134* 135 137  K 5.1 4.6 4.3 3.7  CL 97* 103 101 101  CO2 24 21* 24 24  GLUCOSE 95 121* 96 140*  BUN 29* 33* 39* 42*  CREATININE 1.48* 1.45* 1.51* 1.60*  CALCIUM 9.5 9.2 9.4 9.7  MG  --  2.2  --  2.3    GFR: Estimated Creatinine  Clearance: 25.6 mL/min (A) (by C-G formula based on SCr of 1.6 mg/dL (H)).  Liver Function Tests: Recent Labs  Lab 11/06/21 0255 11/08/21 0053 11/10/21 0013  AST _1 ALT _2 ALKPHOS 64 70 69  BILITOT 0.4 0.3 0.4  PROT 7.2 7.0 7.0  ALBUMIN 2.7* 2.8* 2.8*     Recent Labs  Lab 11/10/21 0013  AMMONIA 24    CBG: Recent Labs  Lab 11/10/21 1540 11/10/21 1940 11/11/21 0009 11/11/21 0522 11/11/21 0811  GLUCAP 77 82 104* 94 72      No results found for this or any previous visit (from the past 240 hour(s)).         Radiology Studies: No results found.   Scheduled Meds:  amLODipine  10 mg Per Tube Daily   carvedilol  3.125 mg Per Tube BID WC   chlordiazePOXIDE  25 mg Per Tube Q12H   chlorhexidine gluconate (MEDLINE KIT)  15 mL Mouth Rinse BID   Chlorhexidine Gluconate Cloth  6 each Topical Daily   docusate  100 mg  Per Tube BID   folic acid  1 mg Per Tube Daily   free water  250 mL Per Tube Q4H   heparin  5,000 Units Subcutaneous Q8H   insulin aspart  0-15 Units Subcutaneous Q4H   lactulose  10 g Per Tube BID   mouth rinse  15 mL Mouth Rinse QID   multivitamin  15 mL Per Tube Daily   pantoprazole sodium  40 mg Per Tube BID   phenobarbital  32.4 mg Per Tube BID   polyethylene glycol  17 g Per Tube Daily   QUEtiapine  100 mg Per Tube BID   thiamine  100 mg Per Tube Daily   Continuous Infusions:  sodium chloride 10 mL/hr at 10/29/21 1800   feeding supplement (OSMOLITE 1.5 CAL) 1,000 mL (11/11/21 0926)     LOS: 17 days     Bonnielee Haff, MD [facility]   If 7PM-7AM, please contact night-coverage [email or web address]  11/11/2021, 10:46 AM

## 2021-11-12 DIAGNOSIS — G9340 Encephalopathy, unspecified: Secondary | ICD-10-CM | POA: Diagnosis not present

## 2021-11-12 DIAGNOSIS — I10 Essential (primary) hypertension: Secondary | ICD-10-CM | POA: Diagnosis not present

## 2021-11-12 DIAGNOSIS — F1020 Alcohol dependence, uncomplicated: Secondary | ICD-10-CM | POA: Diagnosis not present

## 2021-11-12 LAB — BASIC METABOLIC PANEL
Anion gap: 11 (ref 5–15)
BUN: 50 mg/dL — ABNORMAL HIGH (ref 6–20)
CO2: 25 mmol/L (ref 22–32)
Calcium: 9.5 mg/dL (ref 8.9–10.3)
Chloride: 98 mmol/L (ref 98–111)
Creatinine, Ser: 1.67 mg/dL — ABNORMAL HIGH (ref 0.44–1.00)
GFR, Estimated: 36 mL/min — ABNORMAL LOW (ref >60)
Glucose, Bld: 103 mg/dL — ABNORMAL HIGH (ref 70–99)
Potassium: 4.3 mmol/L (ref 3.5–5.1)
Sodium: 134 mmol/L — ABNORMAL LOW (ref 135–145)

## 2021-11-12 LAB — GLUCOSE, CAPILLARY
Glucose-Capillary: 82 mg/dL (ref 70–99)
Glucose-Capillary: 116 mg/dL — ABNORMAL HIGH (ref 70–99)
Glucose-Capillary: 82 mg/dL (ref 70–99)
Glucose-Capillary: 103 mg/dL — ABNORMAL HIGH (ref 70–99)
Glucose-Capillary: 68 mg/dL — ABNORMAL LOW (ref 70–99)
Glucose-Capillary: 98 mg/dL (ref 70–99)

## 2021-11-12 MED ORDER — CHLORDIAZEPOXIDE HCL 25 MG PO CAPS
25.0000 mg | ORAL_CAPSULE | Freq: Every day | ORAL | Status: AC
  Administered 2021-11-12 – 2021-11-14 (×3): 25 mg
  Filled 2021-11-12 (×3): qty 1

## 2021-11-12 MED ORDER — PHENOBARBITAL 32.4 MG PO TABS
16.2000 mg | ORAL_TABLET | Freq: Two times a day (BID) | ORAL | Status: DC
  Administered 2021-11-12 – 2021-11-14 (×5): 16.2 mg
  Filled 2021-11-12 (×5): qty 1

## 2021-11-12 MED ORDER — SODIUM CHLORIDE 0.9 % IV SOLN: INTRAVENOUS | Status: AC

## 2021-11-12 NOTE — Progress Notes (Signed)
PROGRESS NOTE    Sherry Baker  MWN:[id number] DOB: 09/05/1966 DOA: 10/24/2021 PCP: Pcp, No    Brief Narrative: 56 year old with past medical history significant for alcohol dependence, alcoholic liver cirrhosis and polysubstance abuse was brought into the emergency department 12/22 with altered mental status.  She was found to be agitated, screaming with work-up positive for cocaine and cannabis on UDS, UA was positive for probably UTI.  She was admitted to hospitalist service for further evaluation, but she still required more Ativan for delirium remained encephalopathic.  PCCM was consulted.  Patient was a started on Precedex infusion, Librium taper, CIWA Wace Ativan.  On 12/28 patient aspirated, became hypoxic and was intubated.  She was noted to have glottic edema.  Unasyn was started.  She received Solu-Medrol for glottic edema.  Subsequently she was extubated on 12/30.  Patient remains encephalopathic though she has improved.  Some emotional lability is noted.  Diet being slowly advanced.  Hopefully core track can be discontinued in 2 to 3 days depending on her oral intake.    Assessment & Plan:   Acute Toxic Metabolic Encephalopathy Patient went through severe alcohol withdrawal.  Also has history of polysubstance abuse.   CT head did not show any acute findings.  Clonidine has been weaned off. Librium and phenobarbital being weaned off gradually.   Patient's dose of Seroquel was increased on 1/7. Also received 3 days of high-dose thiamine.  Currently on thiamine 100 mg once a day. Ammonia level was normal on 1/8. Mentation seems to be gradually improving.  Emotional lability is noted again.  It looks like she was taken off of restraints for a brief yesterday.  We will try again today.  Acute Respiratory Failure secondary to aspiration pneumonia versus pneumonitis: -Aspirated due to dysphagia.  Glottic edema at the time of intubation. -Continue with pulmonary hygiene, mobilize as  able -Completed 5 days of Unasyn.  Respiratory status is stable.  Saturating normal on room air.  Dysphagia/ Severe malnutrition.  Remains on tube feedings.  Speech therapy continues to follow.  Advanced to dysphagia 2 diet.  Aspiration precautions.  Monitor dietary intake.  If she has consistent intake by mouth core track could be discontinued.  CKD stage III A Baseline appears to be around 1.3-1.4.  Creatinine slowly trending upwards over the last few days.  Free water was increased through the feeding tube.  Will add IV fluids for 24 hours.  Recheck labs tomorrow.    Alcoholic liver cirrhosis  Ammonia normal. Continue with lactulose.   Chronic diastolic CHF/Essential hypertension  Volume status is stable. Started on carvedilol yesterday.  Drop in blood pressure noted.  Amlodipine discontinued.  Continue to monitor.   Agitation is also contributing to her elevated heart rate.  Hyperglycemia Secondary to steroids.  Now stable.  Not noted to be on any steroids currently. SSI tube feeding coverage.   Normocytic anemia No evidence of overt bleeding.  Anemia panel was checked recently and noted to be unremarkable.  History of Rectal cancer s/p chemotherapy, status post colostomy bag Ostomy care per protocol.   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 Subcutaneous fat may be visible but bone, tendon or muscle are NOT exposed. (Active)  10/25/21 1421  Location: Sacrum  Location Orientation: Medial  Staging: Stage 3 -  Full thickness tissue loss. Subcutaneous fat may be visible but bone, tendon or muscle are NOT exposed.  Wound  Description (Comments):   Present on Admission: Yes    Severe protein calorie malnutrition Nutrition Problem: Severe Malnutrition Etiology: chronic illness (cirrhosis, polysubstance abuse) Signs/Symptoms: severe muscle depletion, severe fat depletion Interventions: MVI, Tube  feeding, Hormel Shake   DVT prophylaxis: Subcutaneous Heparin  Code Status: Full code Family Communication: No family at bedside Disposition Plan: SNF has been recommended.  Status is: Inpatient  Remains inpatient appropriate because: remain for encephalopathy requiring tube feeding     Consultants:  CCM   Procedures:  Intubation 12/28---extubation 12/30  Antimicrobials:    Subjective: Emotional lability noted today.  He mentioned that he wants to go home.  Slightly more agitated today compared to yesterday.   Objective: Vitals:   11/11/21 2002 11/12/21 0000 11/12/21 0448 11/12/21 0728  BP: (!) 145/87 132/82 118/75 132/90  Pulse: (!) 101 99 87 (!) 107  Resp: '20 20 17 20  ' Temp: 98 F (36.7 C) 97.8 F (36.6 C) 98.4 F (36.9 C) 98.9 F (37.2 C)  TempSrc: Oral Oral Axillary Oral  SpO2: 100%  98%   Weight:   42.2 kg   Height:        Intake/Output Summary (Last 24 hours) at 11/12/2021 0836 Last data filed at 11/12/2021 0105 Gross per 24 hour  Intake 2750 ml  Output 200 ml  Net 2550 ml    Filed Weights   11/08/21 0527 11/09/21 0409 11/12/21 0448  Weight: 39.5 kg 40.3 kg 42.2 kg    Examination:  General appearance: Awake alert.  Distracted and agitated. Resp: Clear to auscultation bilaterally.  Normal effort Cardio: S1-S2 is normal regular.  No S3-S4.  No rubs murmurs or bruit GI: Abdomen is soft.  Nontender nondistended.  Bowel sounds are present normal.  No masses organomegaly Extremities: No edema.  Moving all 4 extremities Neurologic: Alert.  Oriented to year month.  No focal neurological deficits.      Data Reviewed: I have personally reviewed following labs and imaging studies  CBC: Recent Labs  Lab 11/06/21 0255 11/08/21 0053 11/10/21 0013  WBC 7.0 5.9 5.3  HGB 12.1 11.1* 10.3*  HCT 37.3 34.9* 32.6*  MCV 88.8 87.9 88.3  PLT 345 349 867    Basic Metabolic Panel: Recent Labs  Lab 11/06/21 0255 11/08/21 0053 11/10/21 0013  11/12/21 0044  NA 134* 135 137 134*  K 4.6 4.3 3.7 4.3  CL 103 101 101 98  CO2 21* '24 24 25  ' GLUCOSE 121* 96 140* 103*  BUN 33* 39* 42* 50*  CREATININE 1.45* 1.51* 1.60* 1.67*  CALCIUM 9.2 9.4 9.7 9.5  MG 2.2  --  2.3  --     GFR: Estimated Creatinine Clearance: 25.7 mL/min (A) (by C-G formula based on SCr of 1.67 mg/dL (H)).  Liver Function Tests: Recent Labs  Lab 11/06/21 0255 11/08/21 0053 11/10/21 0013  AST '23 20 19  ' ALT '19 22 20  ' ALKPHOS 64 70 69  BILITOT 0.4 0.3 0.4  PROT 7.2 7.0 7.0  ALBUMIN 2.7* 2.8* 2.8*     Recent Labs  Lab 11/10/21 0013  AMMONIA 24    CBG: Recent Labs  Lab 11/11/21 1549 11/11/21 1959 11/12/21 0100 11/12/21 0451 11/12/21 0730  GLUCAP 100* 87 82 116* 82      No results found for this or any previous visit (from the past 240 hour(s)).         Radiology Studies: No results found.   Scheduled Meds:  carvedilol  3.125 mg Per Tube BID WC  chlordiazePOXIDE  25 mg Per Tube Q12H   chlorhexidine gluconate (MEDLINE KIT)  15 mL Mouth Rinse BID   Chlorhexidine Gluconate Cloth  6 each Topical Daily   docusate  100 mg Per Tube BID   folic acid  1 mg Per Tube Daily   free water  250 mL Per Tube Q4H   heparin  5,000 Units Subcutaneous Q8H   insulin aspart  0-15 Units Subcutaneous Q4H   lactulose  10 g Per Tube BID   mouth rinse  15 mL Mouth Rinse QID   multivitamin  15 mL Per Tube Daily   pantoprazole sodium  40 mg Per Tube BID   phenobarbital  32.4 mg Per Tube BID   polyethylene glycol  17 g Per Tube Daily   QUEtiapine  100 mg Per Tube BID   thiamine  100 mg Per Tube Daily   Continuous Infusions:  sodium chloride 10 mL/hr at 10/29/21 1800   sodium chloride     feeding supplement (OSMOLITE 1.5 CAL) 1,000 mL (11/12/21 0729)     LOS: 18 days     Bonnielee Haff, MD [facility]   If 7PM-7AM, please contact night-coverage [email or web address]  11/12/2021, 8:36 AM

## 2021-11-12 NOTE — Progress Notes (Signed)
Occupational Therapy Treatment Patient Details Name: Sherry Baker MRN: [id number] DOB: 1966/02/11 Today's Date: 11/12/2021   History of present illness 56 yo admitted 12/22 with AMS and encephalopathy. Pt with aspiration requiring intubation 12/28. Extubated 12/30. PMhx: ETOH cirrhosis, polysubstance abuse, bipolar, CHF, CKD, HTn, anxiety, rectal CA s/p ostomy.   OT comments  Pt supine in bed, perseverating on wanting to go home.  Confused but cooperative during OT/PT session.  Completing transfers with min assist +2, LB dressing with min assist +2 and grooming at sink with min assist +2. Pt with decreased awareness, problem solving and safety, decreased recall during session but following simple commands consistently.  Will follow acutely.    Recommendations for follow up therapy are one component of a multi-disciplinary discharge planning process, led by the attending physician.  Recommendations may be updated based on patient status, additional functional criteria and insurance authorization.    Follow Up Recommendations  Skilled nursing-short term rehab (<3 hours/day)    Assistance Recommended at Discharge Frequent or constant Supervision/Assistance  Patient can return home with the following      Equipment Recommendations  BSC/3in1    Recommendations for Other Services      Precautions / Restrictions Precautions Precautions: Fall Precaution Comments: bilateral wrist restraints, cortrack, colostomy Restrictions Weight Bearing Restrictions: No       Mobility Bed Mobility Overal bed mobility: Needs Assistance Bed Mobility: Supine to Sit;Sit to Supine     Supine to sit: Min assist;+2 for safety/equipment;HOB elevated;+2 for physical assistance Sit to supine: Min assist;+2 for safety/equipment;HOB elevated;+2 for physical assistance   General bed mobility comments: cues for sequencing    Transfers Overall transfer level: Needs assistance Equipment used: 2 person hand  held assist;1 person hand held assist Transfers: Sit to/from Stand Sit to Stand: Min assist;+2 physical assistance;+2 safety/equipment           General transfer comment: assist to power up and stabilize balance     Balance Overall balance assessment: Needs assistance Sitting-balance support: Feet supported;No upper extremity supported Sitting balance-Leahy Scale: Fair     Standing balance support: Bilateral upper extremity supported;During functional activity;No upper extremity supported Standing balance-Leahy Scale: Poor Standing balance comment: reliant on external assist                           ADL either performed or assessed with clinical judgement   ADL Overall ADL's : Needs assistance/impaired     Grooming: Minimal assistance;Standing;Wash/dry hands               Lower Body Dressing: Minimal assistance;Sit to/from stand;+2 for safety/equipment;+2 for physical assistance Lower Body Dressing Details (indicate cue type and reason): cueing to problem solve through donning socks Toilet Transfer: Minimal assistance;+2 for physical assistance;+2 for safety/equipment;Ambulation           Functional mobility during ADLs: Minimal assistance;+2 for physical assistance;+2 for safety/equipment;Cueing for safety      Extremity/Trunk Assessment              Vision       Perception     Praxis      Cognition Arousal/Alertness: Awake/alert Behavior During Therapy: Restless;Anxious Overall Cognitive Status: No family/caregiver present to determine baseline cognitive functioning Area of Impairment: Attention;Memory;Following commands;Safety/judgement;Awareness;Problem solving                   Current Attention Level: Selective Memory: Decreased short-term memory Following Commands: Follows one step commands with  increased time Safety/Judgement: Decreased awareness of safety;Decreased awareness of deficits Awareness: Emergent Problem  Solving: Slow processing;Requires verbal cues;Requires tactile cues;Difficulty sequencing General Comments: Difficult to understand at times with jumbled, garbled speech. Pleasant and cooperative during therapy today but perseverating on desire to go home. Tearful at times. Able to state current month and year as well as her birthday. Poor recall of room number and decreased safety awareness.          Exercises     Shoulder Instructions       General Comments VSS on RA    Pertinent Vitals/ Pain       Pain Assessment: Faces Faces Pain Scale: No hurt  Home Living                                          Prior Functioning/Environment              Frequency  Min 2X/week        Progress Toward Goals  OT Goals(current goals can now be found in the care plan section)  Progress towards OT goals: Progressing toward goals  Acute Rehab OT Goals Patient Stated Goal: home OT Goal Formulation: With patient Time For Goal Achievement: 11/18/21 Potential to Achieve Goals: Good  Plan Discharge plan remains appropriate;Frequency remains appropriate    Co-evaluation    PT/OT/SLP Co-Evaluation/Treatment: Yes Reason for Co-Treatment: Necessary to address cognition/behavior during functional activity;To address functional/ADL transfers;For patient/therapist safety PT goals addressed during session: Mobility/safety with mobility;Balance OT goals addressed during session: ADL's and self-care      AM-PAC OT "6 Clicks" Daily Activity     Outcome Measure   Help from another person eating meals?: A Little Help from another person taking care of personal grooming?: A Little Help from another person toileting, which includes using toliet, bedpan, or urinal?: A Lot Help from another person bathing (including washing, rinsing, drying)?: A Little Help from another person to put on and taking off regular upper body clothing?: A Little Help from another person to put on  and taking off regular lower body clothing?: A Lot 6 Click Score: 16    End of Session Equipment Utilized During Treatment: Gait belt  OT Visit Diagnosis: Other abnormalities of gait and mobility (R26.89);Muscle weakness (generalized) (M62.81);Other symptoms and signs involving cognitive function   Activity Tolerance Patient tolerated treatment well   Patient Left in bed;with call bell/phone within reach;with bed alarm set;with restraints reapplied   Nurse Communication Mobility status        Time: 5361-4431 OT Time Calculation (min): 24 min  Charges: OT General Charges $OT Visit: 1 Visit OT Treatments $Self Care/Home Management : 8-22 mins  Jolaine Artist, OT [place] Pager [phone] Office [phone]   Delight Stare 11/12/2021, 11:58 AM

## 2021-11-12 NOTE — Progress Notes (Signed)
Trial off restraints for ten minute increment, patient impulsively attempted to get out of bed while stating "I'm going to go to store to get me some smokes". Patient currently remains in restraints.

## 2021-11-12 NOTE — Progress Notes (Signed)
Physical Therapy Treatment Patient Details Name: Sherry Baker MRN: [id number] DOB: [birth year] Today's Date: 11/12/2021   History of Present Illness 56 yo admitted 12/22 with AMS and encephalopathy. Pt with aspiration requiring intubation 12/28. Extubated 12/30. PMhx: ETOH cirrhosis, polysubstance abuse, bipolar, CHF, CKD, HTn, anxiety, rectal CA s/p ostomy    PT Comments    Pt received in bed, tearful and stating she wants to go home. Pt agreeable to work with therapy.  She required +2 min assist bed mobility, +2 min assist transfers, and +2 min/HHA ambulation 75'. She presents with unsteady gait, shuffle pattern. Max HR 133 during activity. Pt following simple commands consistently, with less confusion. Garbled speech continues but is less prevalent. Pt returned to bed with bilat wrist restraints in place at end of session.   Recommendations for follow up therapy are one component of a multi-disciplinary discharge planning process, led by the attending physician.  Recommendations may be updated based on patient status, additional functional criteria and insurance authorization.  Follow Up Recommendations  Skilled nursing-short term rehab (<3 hours/day)     Assistance Recommended at Discharge Frequent or constant Supervision/Assistance  Patient can return home with the following A lot of help with bathing/dressing/bathroom;Assistance with cooking/housework;Assist for transportation;A lot of help with walking and/or transfers;Direct supervision/assist for financial management;Other (comment) (safe discharge environment)   Equipment Recommendations  Rolling walker (2 wheels);BSC/3in1    Recommendations for Other Services       Precautions / Restrictions Precautions Precautions: Fall Precaution Comments: bilateral wrist restraints, cortrack, colostomy     Mobility  Bed Mobility Overal bed mobility: Needs Assistance Bed Mobility: Supine to Sit;Sit to Supine     Supine to sit: Min  assist;+2 for safety/equipment;HOB elevated;+2 for physical assistance Sit to supine: Min assist;+2 for safety/equipment;HOB elevated;+2 for physical assistance   General bed mobility comments: cues for sequencing    Transfers Overall transfer level: Needs assistance Equipment used: 2 person hand held assist;1 person hand held assist Transfers: Sit to/from Stand Sit to Stand: Min assist;+2 physical assistance;+2 safety/equipment           General transfer comment: assist to power up and stabilize balance    Ambulation/Gait Ambulation/Gait assistance: Min assist;+2 physical assistance;+2 safety/equipment Gait Distance (Feet): 75 Feet Assistive device: 2 person hand held assist Gait Pattern/deviations: Shuffle;Step-through pattern;Step-to pattern;Decreased stride length;Drifts right/left Gait velocity: decreased Gait velocity interpretation: <1.8 ft/sec, indicate of risk for recurrent falls   General Gait Details: unsteady gait requiring +2 bilat assist. HR up to 133   Stairs             Wheelchair Mobility    Modified Rankin (Stroke Patients Only)       Balance Overall balance assessment: Needs assistance Sitting-balance support: No upper extremity supported;Feet supported Sitting balance-Leahy Scale: Fair     Standing balance support: Bilateral upper extremity supported;Single extremity supported;During functional activity Standing balance-Leahy Scale: Poor Standing balance comment: reliant on external assist                            Cognition Arousal/Alertness: Awake/alert Behavior During Therapy: Restless;Anxious Overall Cognitive Status: No family/caregiver present to determine baseline cognitive functioning Area of Impairment: Attention;Memory;Following commands;Safety/judgement;Awareness;Problem solving                   Current Attention Level: Selective Memory: Decreased short-term memory Following Commands: Follows one step  commands with increased time Safety/Judgement: Decreased awareness of safety;Decreased awareness of deficits  Awareness: Emergent Problem Solving: Slow processing;Requires verbal cues;Requires tactile cues;Difficulty sequencing General Comments: Difficult to understand at times with jumbled, garbled speech. Pleasant and cooperative during therapy today but perseverating on desire to go home. Tearful at times. Able to state current month and year as well as her birthday.        Exercises      General Comments        Pertinent Vitals/Pain Pain Assessment: Faces Faces Pain Scale: No hurt    Home Living                          Prior Function            PT Goals (current goals can now be found in the care plan section) Acute Rehab PT Goals Patient Stated Goal: home Progress towards PT goals: Progressing toward goals    Frequency    Min 2X/week      PT Plan Current plan remains appropriate;Equipment recommendations need to be updated    Co-evaluation              AM-PAC PT "6 Clicks" Mobility   Outcome Measure  Help needed turning from your back to your side while in a flat bed without using bedrails?: A Little Help needed moving from lying on your back to sitting on the side of a flat bed without using bedrails?: A Little Help needed moving to and from a bed to a chair (including a wheelchair)?: Total Help needed standing up from a chair using your arms (e.g., wheelchair or bedside chair)?: Total Help needed to walk in hospital room?: Total Help needed climbing 3-5 steps with a railing? : Total 6 Click Score: 10    End of Session Equipment Utilized During Treatment: Gait belt Activity Tolerance: Patient tolerated treatment well Patient left: in bed;with call bell/phone within reach;with restraints reapplied;with bed alarm set Nurse Communication: Mobility status PT Visit Diagnosis: Other abnormalities of gait and mobility (R26.89);Difficulty in  walking, not elsewhere classified (R26.2)     Time: 8638-1771 PT Time Calculation (min) (ACUTE ONLY): 24 min  Charges:  $Gait Training: 8-22 mins                     Lorrin Goodell, PT  Office # [phone] Pager [phone]    Sherry Baker 11/12/2021, 11:09 AM

## 2021-11-13 DIAGNOSIS — G9340 Encephalopathy, unspecified: Secondary | ICD-10-CM | POA: Diagnosis not present

## 2021-11-13 LAB — CBC
HCT: 32.2 % — ABNORMAL LOW (ref 36.0–46.0)
Hemoglobin: 10.5 g/dL — ABNORMAL LOW (ref 12.0–15.0)
MCH: 28.6 pg (ref 26.0–34.0)
MCHC: 32.6 g/dL (ref 30.0–36.0)
MCV: 87.7 fL (ref 80.0–100.0)
Platelets: 274 10*3/uL (ref 150–400)
RBC: 3.67 MIL/uL — ABNORMAL LOW (ref 3.87–5.11)
RDW: 15.9 % — ABNORMAL HIGH (ref 11.5–15.5)
WBC: 5.8 10*3/uL (ref 4.0–10.5)
nRBC: 0 % (ref 0.0–0.2)

## 2021-11-13 LAB — GLUCOSE, CAPILLARY
Glucose-Capillary: 100 mg/dL — ABNORMAL HIGH (ref 70–99)
Glucose-Capillary: 107 mg/dL — ABNORMAL HIGH (ref 70–99)
Glucose-Capillary: 109 mg/dL — ABNORMAL HIGH (ref 70–99)
Glucose-Capillary: 120 mg/dL — ABNORMAL HIGH (ref 70–99)
Glucose-Capillary: 141 mg/dL — ABNORMAL HIGH (ref 70–99)
Glucose-Capillary: 80 mg/dL (ref 70–99)
Glucose-Capillary: 96 mg/dL (ref 70–99)

## 2021-11-13 LAB — BASIC METABOLIC PANEL
Anion gap: 10 (ref 5–15)
BUN: 52 mg/dL — ABNORMAL HIGH (ref 6–20)
CO2: 22 mmol/L (ref 22–32)
Calcium: 9.4 mg/dL (ref 8.9–10.3)
Chloride: 101 mmol/L (ref 98–111)
Creatinine, Ser: 1.43 mg/dL — ABNORMAL HIGH (ref 0.44–1.00)
GFR, Estimated: 44 mL/min — ABNORMAL LOW (ref >60)
Glucose, Bld: 99 mg/dL (ref 70–99)
Potassium: 3.8 mmol/L (ref 3.5–5.1)
Sodium: 133 mmol/L — ABNORMAL LOW (ref 135–145)

## 2021-11-13 MED ORDER — HYDROCORTISONE 1 % EX CREA
TOPICAL_CREAM | Freq: Three times a day (TID) | CUTANEOUS | Status: DC
  Filled 2021-11-13: qty 28

## 2021-11-13 MED ORDER — NICOTINE 21 MG/24HR TD PT24
21.0000 mg | MEDICATED_PATCH | Freq: Every day | TRANSDERMAL | Status: DC
  Administered 2021-11-13 – 2021-11-15 (×3): 21 mg via TRANSDERMAL
  Filled 2021-11-13 (×4): qty 1

## 2021-11-13 NOTE — Progress Notes (Signed)
Speech Language Pathology Treatment: Dysphagia  Patient Details Name: Sherry Baker MRN: [id number] DOB: 1966-09-25 Today's Date: 11/13/2021 Time: 5974-1638 SLP Time Calculation (min) (ACUTE ONLY): 12 min  Assessment / Plan / Recommendation Clinical Impression  Sherry Baker was relaxed and pleasant upon entering room. Nurse at bedside.  Confusion persists as well as ongoing flare-ups of agitation/yelling. Per RN ate a good breakfast but declined lunch.  She continues to demonstrate fluctuating clinical signs of dysphagia, coughing intermittently when eating trials of regular solids. Part of these issues are likely related to positioning - she tends to slide down in the bed and doesn't respond well to cues to sit up or reposition. D/W RD plans to transition her to nocturnal TF. Continue dysphagia 2 for now - mentation is primary barrier to diet progression.    HPI HPI: 56 yo admitted 12/22 with AMS and encephalopathy. Pt with aspiration requiring intubation 12/28. Extubated 12/30. Notes indicate she had glottic edema at time of intubation. PMhx: ETOH cirrhosis, polysubstance abuse, bipolar, CHF, CKD, HTn, anxiety, rectal CA s/p ostomy. MBSS completed in Nov, which was WNL.      SLP Plan  Continue with current plan of care      Recommendations for follow up therapy are one component of a multi-disciplinary discharge planning process, led by the attending physician.  Recommendations may be updated based on patient status, additional functional criteria and insurance authorization.    Recommendations  Diet recommendations: Dysphagia 2 (fine chop);Thin liquid Liquids provided via: Cup;Straw Medication Administration: Whole meds with puree Supervision: Full supervision/cueing for compensatory strategies Compensations: Minimize environmental distractions Postural Changes and/or Swallow Maneuvers: Seated upright 90 degrees                Oral Care Recommendations: Oral care BID Follow Up  Recommendations: No SLP follow up Assistance recommended at discharge: Frequent or constant Supervision/Assistance SLP Visit Diagnosis: Dysphagia, oropharyngeal phase (R13.12) Plan: Continue with current plan of care          Sherry Baker L. Sherry Baker, Sherry Baker Office number [phone] Pager [phone]  Sherry Baker  11/13/2021, 3:55 PM

## 2021-11-13 NOTE — Progress Notes (Signed)
PROGRESS NOTE    Sherry Baker  ACZ:[id number] DOB: [birth year] DOA: 10/24/2021 PCP: Pcp, No   Chief Complain:AMS  Brief Narrative: Patient is a 56 year old female with history of alcohol dependence, alcohol liver cirrhosis, polysubstance abuse was brought to the emergency department on 12/22 with altered mental status.  She was found to be agitated, screaming.  UDS was positive for cocaine, cannabis.  Hospital course remarkable for persistent delirium, encephalopathy regarding Ativan.  PCCM was also consulted and she was started on Precedex infusion, Librium taper.  On 12/28, patient had an aspiration event, became hypoxic and intubated.  She was noted to have glottic edema.  Unasyn was started.  She was extubated on 12/30.  Remains encephalopathic but has been improving slowly.  She is still on core track for feeding purposes, planning for discontinuation when appropriate.  PT/OT recommending skilled nursing facility on discharge.  Assessment & Plan:   Principal Problem:   Acute encephalopathy Active Problems:   Substance abuse (HCC)   Depression   Thrombocytopenia (HCC)   Pressure injury of skin   Alcohol withdrawal (HCC)   Alcohol withdrawal delirium (HCC)   Acute respiratory failure with hypoxia (HCC)   Alcohol use disorder, severe, dependence ([place])   Acute toxic metabolic encephalopathy: Secondary to alcohol withdrawal, polysubstance abuse.  CT head did not show negative findings.  She was started on clonidine, Librium, phenobarbital all of them are being weaned.  Received 3 days of high-dose thiamine.  Mentation seems to be gradually improving.  Has emotional liability.  This morning she was alert and oriented.   Acute respiratory failure with hypoxia: Secondary to aspiration pneumonia/pneumonitis.  She was also found to have glottic edema at the time of intubation.  Currently extubated.  Completed 5 days course of Unasyn.  Currently on room air.  Dysphagia/severe malnutrition:  Remains on tube feeding.  Speech therapy following.  Currently she is on dysphagia 2 diet also.  We will plan to take the feeding tube out in the next 1 to 2 days.  Nutritionist following  CKD stage IIIa: Baseline creatinine ranged from 1.3-1.4.  Currently kidney function stable.  Alcohol liver cirrhosis: Ammonia level normal.  Continue lactulose  Chronic diastolic congestive heart failure/hypertension: Currently volume.  Monitor blood pressure.  Continue current medications, on carvedilol  Hyperglycemia: Most likely secondary to steroids.  Currently stable.  Continue sliding scale  Normocytic anemia: No evidence of bleeding.  Anemia panel was checked and found to be unremarkable.  Continue to monitor hemoglobin.  Currently stable  History of rectal cancer: Status postchemotherapy, has colostomy bag  Pruritic macular rash: On the back.  Continue hydrocortisone cream  Pressure Injury 10/25/21 Sacrum Medial Stage 3 -  Full thickness tissue loss. Subcutaneous fat may be visible but bone, tendon or muscle are NOT exposed. (Active)  10/25/21 1421  Location: Sacrum  Location Orientation: Medial  Staging: Stage 3 -  Full thickness tissue loss. Subcutaneous fat may be visible but bone, tendon or muscle are NOT exposed.  Wound Description (Comments):   Present on Admission: Yes          Nutrition Problem: Severe Malnutrition Etiology: chronic illness (cirrhosis, polysubstance abuse)      DVT prophylaxis:Heparin [place] Code Status: Full Family Communication: None at bedside Patient status:Inpatient  Dispo: The patient is from: Home              Anticipated d/c is to: SNF              Anticipated  d/c date is: In next 2-3 days  Consultants: PCCM  Procedures:  Antimicrobials:  Anti-infectives (From admission, onward)    Start     Dose/Rate Route Frequency Ordered Stop   10/30/21 2015  Ampicillin-Sulbactam (UNASYN) 3 g in sodium chloride 0.9 % 100 mL IVPB        3 g 200 mL/hr  over 30 Minutes Intravenous Every 8 hours 10/30/21 1919 11/03/21 2108   10/28/21 0600  cefTRIAXone (ROCEPHIN) 2 g in sodium chloride 0.9 % 100 mL IVPB        2 g 200 mL/hr over 30 Minutes Intravenous Every 24 hours 10/27/21 1019 10/29/21 0542   10/26/21 0600  cefTRIAXone (ROCEPHIN) 1 g in sodium chloride 0.9 % 100 mL IVPB  Status:  Discontinued        1 g 200 mL/hr over 30 Minutes Intravenous Every 24 hours 10/25/21 0328 10/27/21 1019   10/25/21 0230  cefTRIAXone (ROCEPHIN) 2 g in sodium chloride 0.9 % 100 mL IVPB        2 g 200 mL/hr over 30 Minutes Intravenous  Once 10/25/21 0223 10/25/21 0305       Subjective: Patient seen and examined at the bedside this morning.  Hemodynamically stable.  On room air.  Lying in bed.  Very deconditioned, chronically looking.  Alert and awake, obeys commands, she knew the current month of January.  Overall appears comfortable, still on feeding tube.  Had some sporadic rash on the back  Objective: Vitals:   11/12/21 1215 11/12/21 1603 11/12/21 1939 11/13/21 0819  BP: 130/85 (!) 160/120 (!) 154/90 (!) 144/80  Pulse:  (!) 116 98 91  Resp: 20 19 (!) 21 12  Temp: 98.2 F (36.8 C) 98.8 F (37.1 C) 98.5 F (36.9 C) 97.8 F (36.6 C)  TempSrc: Oral Oral Oral Oral  SpO2:   99%   Weight:      Height:        Intake/Output Summary (Last 24 hours) at 11/13/2021 1046 Last data filed at 11/13/2021 0925 Gross per 24 hour  Intake 450.73 ml  Output 300 ml  Net 150.73 ml   Filed Weights   11/08/21 0527 11/09/21 0409 11/12/21 0448  Weight: 39.5 kg 40.3 kg 42.2 kg    Examination:  General exam: Very deconditioned, chronically ill looking, noticed HEENT: PERRL Respiratory system:  no wheezes or crackles  Cardiovascular system: S1 & S2 heard, RRR.  Gastrointestinal system: Abdomen is nondistended, soft and nontender.  Colostomy Central nervous system: Alert and oriented Extremities: No edema, no clubbing ,no cyanosis Skin: No rashes, no ulcers,no  icterus      Data Reviewed: I have personally reviewed following labs and imaging studies  CBC: Recent Labs  Lab 11/08/21 0053 11/10/21 0013 11/13/21 0236  WBC 5.9 5.3 5.8  HGB 11.1* 10.3* 10.5*  HCT 34.9* 32.6* 32.2*  MCV 87.9 88.3 87.7  PLT 349 301 349   Basic Metabolic Panel: Recent Labs  Lab 11/08/21 0053 11/10/21 0013 11/12/21 0044 11/13/21 0236  NA 135 137 134* 133*  K 4.3 3.7 4.3 3.8  CL 101 101 98 101  CO2 '24 24 25 22  ' GLUCOSE 96 140* 103* 99  BUN 39* 42* 50* 52*  CREATININE 1.51* 1.60* 1.67* 1.43*  CALCIUM 9.4 9.7 9.5 9.4  MG  --  2.3  --   --    GFR: Estimated Creatinine Clearance: 30 mL/min (A) (by C-G formula based on SCr of 1.43 mg/dL (H)). Liver Function Tests: Recent Labs  Lab 11/08/21 0053 11/10/21 0013  AST 20 19  ALT 22 20  ALKPHOS 70 69  BILITOT 0.3 0.4  PROT 7.0 7.0  ALBUMIN 2.8* 2.8*   No results for input(s): LIPASE, AMYLASE in the last 168 hours. Recent Labs  Lab 11/10/21 0013  AMMONIA 24   Coagulation Profile: No results for input(s): INR, PROTIME in the last 168 hours. Cardiac Enzymes: No results for input(s): CKTOTAL, CKMB, CKMBINDEX, TROPONINI in the last 168 hours. BNP (last 3 results) No results for input(s): PROBNP in the last 8760 hours. HbA1C: No results for input(s): HGBA1C in the last 72 hours. CBG: Recent Labs  Lab 11/12/21 1604 11/12/21 1942 11/13/21 0037 11/13/21 0430 11/13/21 0821  GLUCAP 68* 98 107* 80 96   Lipid Profile: No results for input(s): CHOL, HDL, LDLCALC, TRIG, CHOLHDL, LDLDIRECT in the last 72 hours. Thyroid Function Tests: No results for input(s): TSH, T4TOTAL, FREET4, T3FREE, THYROIDAB in the last 72 hours. Anemia Panel: No results for input(s): VITAMINB12, FOLATE, FERRITIN, TIBC, IRON, RETICCTPCT in the last 72 hours. Sepsis Labs: No results for input(s): PROCALCITON, LATICACIDVEN in the last 168 hours.  No results found for this or any previous visit (from the past 240 hour(s)).        Radiology Studies: No results found.      Scheduled Meds:  carvedilol  3.125 mg Per Tube BID WC   chlordiazePOXIDE  25 mg Per Tube Daily   chlorhexidine gluconate (MEDLINE KIT)  15 mL Mouth Rinse BID   Chlorhexidine Gluconate Cloth  6 each Topical Daily   docusate  100 mg Per Tube BID   folic acid  1 mg Per Tube Daily   free water  250 mL Per Tube Q4H   heparin  5,000 Units Subcutaneous Q8H   insulin aspart  0-15 Units Subcutaneous Q4H   lactulose  10 g Per Tube BID   mouth rinse  15 mL Mouth Rinse QID   multivitamin  15 mL Per Tube Daily   pantoprazole sodium  40 mg Per Tube BID   phenobarbital  16.2 mg Per Tube BID   polyethylene glycol  17 g Per Tube Daily   QUEtiapine  100 mg Per Tube BID   thiamine  100 mg Per Tube Daily   Continuous Infusions:  sodium chloride 10 mL/hr at 10/29/21 1800   feeding supplement (OSMOLITE 1.5 CAL) 1,000 mL (11/13/21 0500)     LOS: 19 days    Time spent: 35 mins.More than 50% of that time was spent in counseling and/or coordination of care.      Shelly Coss, MD [facility] P1/09/2022, 10:46 AM

## 2021-11-13 NOTE — Progress Notes (Signed)
Inpatient Diabetes Program Recommendations  AACE/ADA: New Consensus Statement on Inpatient Glycemic Control (2015)  Target Ranges:  Prepandial:   less than 140 mg/dL      Peak postprandial:   less than 180 mg/dL (1-2 hours)      Critically ill patients:  140 - 180 mg/dL   Lab Results  Component Value Date   GLUCAP 96 11/13/2021   HGBA1C 5.2 10/29/2021    Review of Glycemic Control  Latest Reference Range & Units 11/12/21 16:04 11/12/21 19:42 11/13/21 00:37 11/13/21 04:30 11/13/21 08:21  Glucose-Capillary 70 - 99 mg/dL 68 (L) 98 107 (H) 80 96   Diabetes history: None  Current orders for Inpatient glycemic control:  Novolog moderate q 4 hours Osmolite 55 cc/hr  Inpatient Diabetes Program Recommendations:    CBG's less than 100 mg/dL.  May consider d/c of Novolog correction- Does not appear to need.   Thanks,  Adah Perl, RN, BC-ADM Inpatient Diabetes Coordinator Pager [phone]  (8a-5p)

## 2021-11-14 DIAGNOSIS — G9340 Encephalopathy, unspecified: Secondary | ICD-10-CM | POA: Diagnosis not present

## 2021-11-14 LAB — GLUCOSE, CAPILLARY
Glucose-Capillary: 114 mg/dL — ABNORMAL HIGH (ref 70–99)
Glucose-Capillary: 77 mg/dL (ref 70–99)
Glucose-Capillary: 102 mg/dL — ABNORMAL HIGH (ref 70–99)
Glucose-Capillary: 101 mg/dL — ABNORMAL HIGH (ref 70–99)
Glucose-Capillary: 109 mg/dL — ABNORMAL HIGH (ref 70–99)

## 2021-11-14 MED ORDER — CARVEDILOL 6.25 MG PO TABS
6.2500 mg | ORAL_TABLET | Freq: Two times a day (BID) | ORAL | Status: DC
  Administered 2021-11-14: 6.25 mg
  Filled 2021-11-14: qty 1

## 2021-11-14 MED ORDER — FREE WATER
30.0000 mL | Status: DC
  Administered 2021-11-14: 30 mL

## 2021-11-14 MED ORDER — OSMOLITE 1.5 CAL PO LIQD
660.0000 mL | ORAL | Status: DC
  Filled 2021-11-14 (×3): qty 711

## 2021-11-14 MED ORDER — BUSPIRONE HCL 5 MG PO TABS
15.0000 mg | ORAL_TABLET | Freq: Two times a day (BID) | ORAL | Status: DC
  Administered 2021-11-14 – 2021-11-16 (×5): 15 mg via ORAL
  Filled 2021-11-14 (×6): qty 3

## 2021-11-14 MED ORDER — CITALOPRAM HYDROBROMIDE 20 MG PO TABS
10.0000 mg | ORAL_TABLET | Freq: Every day | ORAL | Status: DC
  Administered 2021-11-14 – 2021-11-16 (×3): 10 mg via ORAL
  Filled 2021-11-14 (×3): qty 1

## 2021-11-14 MED ORDER — ADULT MULTIVITAMIN W/MINERALS CH
1.0000 | ORAL_TABLET | Freq: Every day | ORAL | Status: DC
  Administered 2021-11-15 – 2021-11-16 (×2): 1 via ORAL
  Filled 2021-11-14 (×2): qty 1

## 2021-11-14 MED ORDER — GABAPENTIN 100 MG PO CAPS
100.0000 mg | ORAL_CAPSULE | Freq: Three times a day (TID) | ORAL | Status: DC
  Administered 2021-11-14 – 2021-11-16 (×6): 100 mg via ORAL
  Filled 2021-11-14 (×6): qty 1

## 2021-11-14 NOTE — Progress Notes (Addendum)
PROGRESS NOTE    Sherry Baker  IBB:[id number] DOB: 07/19/1966 DOA: 10/24/2021 PCP: Pcp, No   Chief Complain:AMS  Brief Narrative: Patient is a 56 year old female with history of alcohol dependence, alcohol liver cirrhosis, polysubstance abuse was brought to the emergency department on 12/22 with altered mental status.  She was found to be agitated, screaming.  UDS was positive for cocaine, cannabis.  Hospital course remarkable for persistent delirium, encephalopathy regarding Ativan.  PCCM was also consulted and she was started on Precedex infusion, Librium taper.  On 12/28, patient had an aspiration event, became hypoxic and intubated.  She was noted to have glottic edema.  Unasyn was started.  She was extubated on 12/30.  Remains encephalopathic but has been improving slowly.  She is still on core track for feeding purposes, planning for discontinuation when appropriate/when nutritionist recommends.  PT/OT recommending skilled nursing facility on discharge.  Assessment & Plan:   Principal Problem:   Acute encephalopathy Active Problems:   Substance abuse (HCC)   Depression   Thrombocytopenia (HCC)   Pressure injury of skin   Alcohol withdrawal (HCC)   Alcohol withdrawal delirium (HCC)   Acute respiratory failure with hypoxia (HCC)   Alcohol use disorder, severe, dependence ([place])   Acute toxic metabolic encephalopathy: Secondary to alcohol withdrawal, polysubstance abuse.  CT head did not show negative findings.  She was started on clonidine, Librium, phenobarbital all of them are being weaned.  Received 3 days of high-dose thiamine.  Mentation seems to be gradually improving.  Has emotional liability.  This morning she was alert and oriented.   Acute respiratory failure with hypoxia: Secondary to aspiration pneumonia/pneumonitis.  She was also found to have glottic edema at the time of intubation.  Currently extubated.  Completed 5 days course of Unasyn.  Currently on room  air.  Dysphagia/severe malnutrition: Remains on tube feeding.  Speech therapy following.  Currently she is on dysphagia 2 diet also.  We will plan to take the feeding tube out after nutritionist recommends, nutrition is currently recommending to continue tube feeding because she has poor oral intake  CKD stage IIIa: Baseline creatinine ranged from 1.3-1.4.  Currently kidney function stable.  Alcohol liver cirrhosis: Ammonia level normal.  Continue lactulose  Chronic diastolic congestive heart failure/hypertension: Currently volume.  Monitor blood pressure.  Continue current medications, on carvedilol  Hyperglycemia: Most likely secondary to steroids.  Currently stable.  Continue sliding scale  Normocytic anemia: No evidence of bleeding.  Anemia panel was checked and found to be unremarkable.  Continue to monitor hemoglobin.  Currently stable  History of rectal cancer: Status postchemotherapy, has colostomy bag  Pruritic macular rash: On the back.  Continue hydrocortisone cream  Debility/deconditioning: PT/OT recommending skilled nursing facility at discharge.  Patient is reluctant and states she wants to go home  Pressure Injury 10/25/21 Sacrum Medial Stage 3 -  Full thickness tissue loss. Subcutaneous fat may be visible but bone, tendon or muscle are NOT exposed. (Active)  10/25/21 1421  Location: Sacrum  Location Orientation: Medial  Staging: Stage 3 -  Full thickness tissue loss. Subcutaneous fat may be visible but bone, tendon or muscle are NOT exposed.  Wound Description (Comments):   Present on Admission: Yes          Nutrition Problem: Severe Malnutrition Etiology: chronic illness (cirrhosis, polysubstance abuse)      DVT prophylaxis:Heparin [place] Code Status: Full Family Communication: None at bedside Patient status:Inpatient  Dispo: The patient is from: Home  Anticipated d/c is to: SNF              Anticipated d/c date is: when tube feed is  removed  Consultants: PCCM  Procedures:  Antimicrobials:  Anti-infectives (From admission, onward)    Start     Dose/Rate Route Frequency Ordered Stop   10/30/21 2015  Ampicillin-Sulbactam (UNASYN) 3 g in sodium chloride 0.9 % 100 mL IVPB        3 g 200 mL/hr over 30 Minutes Intravenous Every 8 hours 10/30/21 1919 11/03/21 2108   10/28/21 0600  cefTRIAXone (ROCEPHIN) 2 g in sodium chloride 0.9 % 100 mL IVPB        2 g 200 mL/hr over 30 Minutes Intravenous Every 24 hours 10/27/21 1019 10/29/21 0542   10/26/21 0600  cefTRIAXone (ROCEPHIN) 1 g in sodium chloride 0.9 % 100 mL IVPB  Status:  Discontinued        1 g 200 mL/hr over 30 Minutes Intravenous Every 24 hours 10/25/21 0328 10/27/21 1019   10/25/21 0230  cefTRIAXone (ROCEPHIN) 2 g in sodium chloride 0.9 % 100 mL IVPB        2 g 200 mL/hr over 30 Minutes Intravenous  Once 10/25/21 0223 10/25/21 0305       Subjective: Patient seen and examined at the bedside this morning.  She was sitting at the edge of bed.  Looks much better than yesterday.  More alert, coherent and communicates well.  Oriented well.  She was working with PT. she says that she wants to go home and does not want to go to rehab.  Objective: Vitals:   11/13/21 2357 11/14/21 0345 11/14/21 0400 11/14/21 0740  BP: (!) 84/52 107/62 111/73 (!) 126/92  Pulse:    (!) 125  Resp: '20 19 20 19  ' Temp: 97.7 F (36.5 C) 97.6 F (36.4 C)  97.9 F (36.6 C)  TempSrc: Axillary Axillary  Oral  SpO2:      Weight:  43.8 kg    Height:        Intake/Output Summary (Last 24 hours) at 11/14/2021 0857 Last data filed at 11/14/2021 0630 Gross per 24 hour  Intake 2315 ml  Output 1250 ml  Net 1065 ml   Filed Weights   11/12/21 0448 11/13/21 2002 11/14/21 0345  Weight: 42.2 kg 45.6 kg 43.8 kg    Examination:   General exam: Very deconditioned, chronically looking HEENT: PERRL Respiratory system:  no wheezes or crackles  Cardiovascular system: S1 & S2 heard, RRR.   Gastrointestinal system: Abdomen is nondistended, soft and nontender.colostomy Central nervous system: Alert and oriented Extremities: No edema, no clubbing ,no cyanosis Skin: Some macular rash on the back   Data Reviewed: I have personally reviewed following labs and imaging studies  CBC: Recent Labs  Lab 11/08/21 0053 11/10/21 0013 11/13/21 0236  WBC 5.9 5.3 5.8  HGB 11.1* 10.3* 10.5*  HCT 34.9* 32.6* 32.2*  MCV 87.9 88.3 87.7  PLT 349 301 948   Basic Metabolic Panel: Recent Labs  Lab 11/08/21 0053 11/10/21 0013 11/12/21 0044 11/13/21 0236  NA 135 137 134* 133*  K 4.3 3.7 4.3 3.8  CL 101 101 98 101  CO2 '24 24 25 22  ' GLUCOSE 96 140* 103* 99  BUN 39* 42* 50* 52*  CREATININE 1.51* 1.60* 1.67* 1.43*  CALCIUM 9.4 9.7 9.5 9.4  MG  --  2.3  --   --    GFR: Estimated Creatinine Clearance: 31.1 mL/min (A) (by C-G formula  based on SCr of 1.43 mg/dL (H)). Liver Function Tests: Recent Labs  Lab 11/08/21 0053 11/10/21 0013  AST 20 19  ALT 22 20  ALKPHOS 70 69  BILITOT 0.3 0.4  PROT 7.0 7.0  ALBUMIN 2.8* 2.8*   No results for input(s): LIPASE, AMYLASE in the last 168 hours. Recent Labs  Lab 11/10/21 0013  AMMONIA 24   Coagulation Profile: No results for input(s): INR, PROTIME in the last 168 hours. Cardiac Enzymes: No results for input(s): CKTOTAL, CKMB, CKMBINDEX, TROPONINI in the last 168 hours. BNP (last 3 results) No results for input(s): PROBNP in the last 8760 hours. HbA1C: No results for input(s): HGBA1C in the last 72 hours. CBG: Recent Labs  Lab 11/13/21 1617 11/13/21 2001 11/13/21 2357 11/14/21 0344 11/14/21 0744  GLUCAP 100* 109* 120* 114* 77   Lipid Profile: No results for input(s): CHOL, HDL, LDLCALC, TRIG, CHOLHDL, LDLDIRECT in the last 72 hours. Thyroid Function Tests: No results for input(s): TSH, T4TOTAL, FREET4, T3FREE, THYROIDAB in the last 72 hours. Anemia Panel: No results for input(s): VITAMINB12, FOLATE, FERRITIN, TIBC,  IRON, RETICCTPCT in the last 72 hours. Sepsis Labs: No results for input(s): PROCALCITON, LATICACIDVEN in the last 168 hours.  No results found for this or any previous visit (from the past 240 hour(s)).       Radiology Studies: No results found.      Scheduled Meds:  carvedilol  3.125 mg Per Tube BID WC   chlorhexidine gluconate (MEDLINE KIT)  15 mL Mouth Rinse BID   Chlorhexidine Gluconate Cloth  6 each Topical Daily   docusate  100 mg Per Tube BID   folic acid  1 mg Per Tube Daily   free water  250 mL Per Tube Q4H   heparin  5,000 Units Subcutaneous Q8H   hydrocortisone cream   Topical TID   lactulose  10 g Per Tube BID   mouth rinse  15 mL Mouth Rinse QID   multivitamin  15 mL Per Tube Daily   nicotine  21 mg Transdermal Daily   pantoprazole sodium  40 mg Per Tube BID   phenobarbital  16.2 mg Per Tube BID   polyethylene glycol  17 g Per Tube Daily   QUEtiapine  100 mg Per Tube BID   thiamine  100 mg Per Tube Daily   Continuous Infusions:  sodium chloride 10 mL/hr at 10/29/21 1800   feeding supplement (OSMOLITE 1.5 CAL) 1,000 mL (11/13/21 0500)     LOS: 20 days    Time spent: 25 mins.More than 50% of that time was spent in counseling and/or coordination of care.      Shelly Coss, MD [facility] P1/10/2022, 8:57 AM

## 2021-11-14 NOTE — Plan of Care (Signed)
°  Problem: Clinical Measurements: Goal: Ability to maintain clinical measurements within normal limits will improve Outcome: Progressing Goal: Diagnostic test results will improve Outcome: Progressing Goal: Cardiovascular complication will be avoided Outcome: Progressing   Problem: Activity: Goal: Risk for activity intolerance will decrease Outcome: Progressing   Problem: Nutrition: Goal: Adequate nutrition will be maintained Outcome: Progressing   Problem: Elimination: Goal: Will not experience complications related to urinary retention Outcome: Progressing   Problem: Safety: Goal: Ability to remain free from injury will improve Outcome: Progressing   Problem: Skin Integrity: Goal: Risk for impaired skin integrity will decrease Outcome: Progressing

## 2021-11-14 NOTE — Progress Notes (Addendum)
Nutrition Follow-up  DOCUMENTATION CODES:   Underweight, Severe malnutrition in context of chronic illness  INTERVENTION:   Transition tube feeds to nocturnal via Cortrak: Osmolite 1.5 @ 55 ml/hr (660 ml/day) x 12 hours (6 pm - 6 am) 30 mL q4h for tube maintenance Tube feeding regimen provides 990 kcal, 41 grams of protein, and 503 mL of free water (683 mL total free water) daily. Meeting 50% of pt needs.   Transition to oral MVI with minerals daily  Continue Vital Cuisine Shake TID, each supplement provides 520 kcal and 22 grams of protein  NUTRITION DIAGNOSIS:   Severe Malnutrition related to chronic illness (cirrhosis, polysubstance abuse) as evidenced by severe muscle depletion, severe fat depletion. - Ongoing, being addressed via TF  GOAL:   Patient will meet greater than or equal to 90% of their needs - Met via TF  MONITOR:   PO intake, Supplement acceptance, Labs, Skin  REASON FOR ASSESSMENT:   Consult Enteral/tube feeding initiation and management  ASSESSMENT:   56 yo female with a PMH of alcohol dependence, alcoholic liver cirrhosis, rectal cancer s/p colostomy, and polysubstance abuse who presents with altered mental status. She was found to be agitated, screaming, work-up UDS positive for cocaine and cannabis, UA was positive for probable UTI.  12/24 - NG tube placed 12/28 - Cortrak placed (tip gastric), aspiration event, intubated 12/30 - extubated 01/04 - transferred to floor; diet advanced to Dysphagia 1 01/08 - diet advanced to Dysphagia 2   Pt sitting on edge of bed at time of visit. Pt reports that she has been doing ok. RD asked how breakfast went, pt reports that she was not eating it because she has had the same thing every morning. RD observed 0% of breakfast consumed. Pt reports that she has been drinking the Vital Cusine/[employer] shakes. Pt denies any nausea or vomiting.   Per EMR, pt intake includes: 01/07: Breakfast 85%, Lunch 75%, Dinner  25% 01/09: Breakfast 25%, Lunch 75%, Dinner 5% 01/11: Breakfast 90%, Lunch 0%  RD observed TF hanging in open system versus ready to hang bottles. Reached out to RN to clarify. RD discussed with RN to discontinue current bag as it was about to be expired and that we will transition pt to nocturnal feeds.  Medications reviewed and include: Colace, Folic acid, Lactulose, MVI with minerals daily, Protonix, Miralax, Thiamine  Labs reviewed: BUN 52, Creatinine 1.43, 24 hr CBG: 77-141  Diet Order:   Diet Order             DIET DYS 2 Room service appropriate? No; Fluid consistency: Thin  Diet effective now                   EDUCATION NEEDS:   Not appropriate for education at this time  Skin:  Skin Assessment: Skin Integrity Issues: Stage III: Sacrum  Last BM:  01/11 - via colostomy  Height:   Ht Readings from Last 1 Encounters:  10/25/21 '5\' 2"'  (1.575 m)    Weight:   Wt Readings from Last 1 Encounters:  11/14/21 43.8 kg    BMI:  Body mass index is 17.66 kg/m.  Estimated Nutritional Needs:   Kcal:  1800-2000  Protein:  70-85 grams  Fluid:  >1.8 L   Tammela Bales Louie Casa, RD, LDN Clinical Dietitian See [facility] for contact information.

## 2021-11-14 NOTE — Progress Notes (Signed)
Rounding on patient and as I entered the room, the patient was pulling on her cortrak trying to remove it. The bridle and myself stopped her from fully removing the cortrak however she was able to pull it back. Cortrak was placed at 60 cm and now is at 32 cm. Notified Dr. Tawanna Solo of the situation and was ordered to discontinue the cortrak.

## 2021-11-14 NOTE — TOC Progression Note (Signed)
Transition of Care [facility]) - Progression Note    Patient Details  Name: TERRIS BODIN MRN: [id number] Date of Birth: 1966/02/05  Transition of Care [facility] CM/SW [place], LCSW Phone Number: 11/14/2021, 9:28 AM  Clinical Narrative:    TOC continuing to follow.   Expected Discharge Plan: Home/Self Care Barriers to Discharge: ED Active Substance Abuse, Continued Medical Work up  Expected Discharge Plan and Services Expected Discharge Plan: Home/Self Care In-house Referral: Clinical Social Work     Living arrangements for the past 2 months: Apartment                                       Social Determinants of Health (SDOH) Interventions    Readmission Risk Interventions Readmission Risk Prevention Plan 09/30/2021 09/24/2021  Transportation Screening Complete Complete  Medication Review Press photographer) Complete Complete  PCP or Specialist appointment within 3-5 days of discharge Complete Complete  HRI or [place] Not Complete Not Complete  HRI or Home Care Consult Pt Refusal Comments - history of unsafe home environment  SW Recovery Care/Counseling Consult Complete Complete  Palliative Care Screening Not Applicable Not [place] Not Applicable Complete  Some recent data might be hidden

## 2021-11-14 NOTE — Progress Notes (Signed)
Occupational Therapy Treatment Patient Details Name: SHIVAUN BILELLO MRN: [id number] DOB: [birth year] Today's Date: 11/14/2021   History of present illness 56 yo admitted 12/22 with AMS and encephalopathy. Pt with aspiration requiring intubation 12/28. Extubated 12/30. PMhx: ETOH cirrhosis, polysubstance abuse, bipolar, CHF, CKD, HTn, anxiety, rectal CA s/p ostomy.   OT comments  Patient sitting EOB upon entry, perseverating on going home again today.  Patient eager to engage with OT to "walk", but as sitting EOB pt voices "I just peed".  Mobility to sink with min assist using RW, engaged in bathing/dressing with up to min assist but requires max cueing for sequencing, problem solving and awareness.  Updated goals today.  Continue to recommend SNF due to cognition, generalized weakness and impaired balance with high risk for falls.    Recommendations for follow up therapy are one component of a multi-disciplinary discharge planning process, led by the attending physician.  Recommendations may be updated based on patient status, additional functional criteria and insurance authorization.    Follow Up Recommendations  Skilled nursing-short term rehab (<3 hours/day)    Assistance Recommended at Discharge Frequent or constant Supervision/Assistance  Patient can return home with the following  A little help with walking and/or transfers;A little help with bathing/dressing/bathroom;Assistance with cooking/housework;Direct supervision/assist for medications management;Direct supervision/assist for financial management;Assist for transportation;Help with stairs or ramp for entrance   Equipment Recommendations  BSC/3in1    Recommendations for Other Services      Precautions / Restrictions Precautions Precautions: Fall Precaution Comments: cortrack, colostomy Restrictions Weight Bearing Restrictions: No       Mobility Bed Mobility               General bed mobility comments: EOB upon  entry and at exit    Transfers                         Balance Overall balance assessment: Needs assistance Sitting-balance support: No upper extremity supported;Feet supported Sitting balance-Leahy Scale: Fair     Standing balance support: No upper extremity supported;During functional activity Standing balance-Leahy Scale: Poor Standing balance comment: relies on external support dynaimcally but able to stand at sink during ADLs with min guard and 0 hand support                           ADL either performed or assessed with clinical judgement   ADL Overall ADL's : Needs assistance/impaired     Grooming: Min guard;Standing               Lower Body Dressing: Min guard;Sit to/from stand   Toilet Transfer: Ambulation;Minimal Print production planner Details (indicate cue type and reason): simulated in room Toileting- Clothing Manipulation and Hygiene: Sit to/from stand;Minimal assistance Toileting - Clothing Manipulation Details (indicate cue type and reason): simulated, incontient of urine and able to complete clean up with cueing     Functional mobility during ADLs: Minimal assistance;Rolling walker (2 wheels);Cueing for safety General ADL Comments: pt limited by cognition, weakness and balance    Extremity/Trunk Assessment              Vision       Perception     Praxis      Cognition Arousal/Alertness: Awake/alert Behavior During Therapy: Restless;Anxious Overall Cognitive Status: No family/caregiver present to determine baseline cognitive functioning Area of Impairment: Attention;Memory;Following commands;Safety/judgement;Awareness;Problem solving  Current Attention Level: Sustained Memory: Decreased short-term memory Following Commands: Follows one step commands with increased time Safety/Judgement: Decreased awareness of safety;Decreased awareness of deficits Awareness: Emergent Problem Solving:  Slow processing;Difficulty sequencing;Requires verbal cues General Comments: pt pleasant and cooperative, tearful initally and perseverating on going home.  Able to follow simple commands but decreased awareness of deficits and safety, sitting edge of bed able to verbalize she "peed" but didn't attempt to ask therapist to go to the bathroom. Requires cueing for problem solving throughout session.          Exercises     Shoulder Instructions       General Comments VSS on RA, easily redirected    Pertinent Vitals/ Pain       Pain Assessment: Faces Faces Pain Scale: No hurt  Home Living                                          Prior Functioning/Environment              Frequency  Min 2X/week        Progress Toward Goals  OT Goals(current goals can now be found in the care plan section)  Progress towards OT goals: Progressing toward goals  Acute Rehab OT Goals Patient Stated Goal: home OT Goal Formulation: With patient Time For Goal Achievement: 12/02/21 Potential to Achieve Goals: Good  Plan Discharge plan remains appropriate;Frequency remains appropriate    Co-evaluation                 AM-PAC OT "6 Clicks" Daily Activity     Outcome Measure   Help from another person eating meals?: A Little Help from another person taking care of personal grooming?: A Little Help from another person toileting, which includes using toliet, bedpan, or urinal?: A Little Help from another person bathing (including washing, rinsing, drying)?: A Little Help from another person to put on and taking off regular upper body clothing?: A Little Help from another person to put on and taking off regular lower body clothing?: A Little 6 Click Score: 18    End of Session Equipment Utilized During Treatment: Rolling walker (2 wheels)  OT Visit Diagnosis: Other abnormalities of gait and mobility (R26.89);Muscle weakness (generalized) (M62.81);Other symptoms and  signs involving cognitive function   Activity Tolerance Patient tolerated treatment well   Patient Left with call bell/phone within reach;with bed alarm set;Other (comment) (seated EOB)   Nurse Communication Mobility status        Time: 2426-8341 OT Time Calculation (min): 20 min  Charges: OT General Charges $OT Visit: 1 Visit OT Treatments $Self Care/Home Management : 8-22 mins  Jolaine Artist, OT [place] Pager [phone] Office [phone]   Delight Stare 11/14/2021, 10:11 AM

## 2021-11-14 NOTE — TOC Progression Note (Signed)
Transition of Care [facility]) - Progression Note    Patient Details  Name: Sherry Baker MRN: [id number] Date of Birth: 05/28/1966  Transition of Care [facility]) CM/SW [place], LCSW Phone Number: 11/14/2021, 9:29 AM  Clinical Narrative:    TOC continuing to follow. Barriers for placement includes patient's substance use history, Medicaid, and younger age.    Expected Discharge Plan: Home/Self Care Barriers to Discharge: ED Active Substance Abuse, Continued Medical Work up  Expected Discharge Plan and Services Expected Discharge Plan: Home/Self Care In-house Referral: Clinical Social Work     Living arrangements for the past 2 months: Apartment                                       Social Determinants of Health (SDOH) Interventions    Readmission Risk Interventions Readmission Risk Prevention Plan 09/30/2021 09/24/2021  Transportation Screening Complete Complete  Medication Review Press photographer) Complete Complete  PCP or Specialist appointment within 3-5 days of discharge Complete Complete  HRI or [place] Not Complete Not Complete  HRI or Home Care Consult Pt Refusal Comments - history of unsafe home environment  SW Recovery Care/Counseling Consult Complete Complete  Palliative Care Screening Not Applicable Not [place] Not Applicable Complete  Some recent data might be hidden

## 2021-11-15 LAB — GLUCOSE, CAPILLARY: Glucose-Capillary: 90 mg/dL (ref 70–99)

## 2021-11-15 LAB — BASIC METABOLIC PANEL
Anion gap: 12 (ref 5–15)
BUN: 64 mg/dL — ABNORMAL HIGH (ref 6–20)
CO2: 24 mmol/L (ref 22–32)
Calcium: 9.7 mg/dL (ref 8.9–10.3)
Chloride: 102 mmol/L (ref 98–111)
Creatinine, Ser: 2.12 mg/dL — ABNORMAL HIGH (ref 0.44–1.00)
GFR, Estimated: 27 mL/min — ABNORMAL LOW (ref >60)
Glucose, Bld: 94 mg/dL (ref 70–99)
Potassium: 4.1 mmol/L (ref 3.5–5.1)
Sodium: 138 mmol/L (ref 135–145)

## 2021-11-15 MED ORDER — ACETAMINOPHEN 650 MG RE SUPP: 650.0000 mg | Freq: Four times a day (QID) | RECTAL | Status: DC | PRN

## 2021-11-15 MED ORDER — FOLIC ACID 1 MG PO TABS
1.0000 mg | ORAL_TABLET | Freq: Every day | ORAL | Status: DC
  Administered 2021-11-16: 1 mg via ORAL
  Filled 2021-11-15: qty 1

## 2021-11-15 MED ORDER — LACTULOSE 10 GM/15ML PO SOLN
10.0000 g | Freq: Two times a day (BID) | ORAL | Status: DC
  Administered 2021-11-16: 10 g via ORAL
  Filled 2021-11-15: qty 15

## 2021-11-15 MED ORDER — POLYETHYLENE GLYCOL 3350 17 G PO PACK
17.0000 g | PACK | Freq: Every day | ORAL | Status: DC
  Administered 2021-11-16: 17 g via ORAL
  Filled 2021-11-15: qty 1

## 2021-11-15 MED ORDER — CARVEDILOL 3.125 MG PO TABS
3.1250 mg | ORAL_TABLET | Freq: Two times a day (BID) | ORAL | Status: DC
  Administered 2021-11-15: 3.125 mg
  Filled 2021-11-15: qty 1

## 2021-11-15 MED ORDER — THIAMINE HCL 100 MG PO TABS
100.0000 mg | ORAL_TABLET | Freq: Every day | ORAL | Status: DC
  Administered 2021-11-16: 100 mg via ORAL
  Filled 2021-11-15: qty 1

## 2021-11-15 MED ORDER — ACETAMINOPHEN 325 MG PO TABS
650.0000 mg | ORAL_TABLET | Freq: Four times a day (QID) | ORAL | Status: DC | PRN
  Administered 2021-11-16: 650 mg via ORAL
  Filled 2021-11-15: qty 2

## 2021-11-15 MED ORDER — SODIUM CHLORIDE 0.9 % IV SOLN: INTRAVENOUS | Status: DC

## 2021-11-15 MED ORDER — CARVEDILOL 3.125 MG PO TABS
3.1250 mg | ORAL_TABLET | Freq: Two times a day (BID) | ORAL | Status: DC
  Administered 2021-11-16: 3.125 mg via ORAL
  Filled 2021-11-15: qty 1

## 2021-11-15 MED ORDER — DOCUSATE SODIUM 50 MG/5ML PO LIQD
100.0000 mg | Freq: Two times a day (BID) | ORAL | Status: DC
  Administered 2021-11-16: 100 mg via ORAL
  Filled 2021-11-15: qty 10

## 2021-11-15 MED ORDER — PANTOPRAZOLE 2 MG/ML SUSPENSION
40.0000 mg | Freq: Two times a day (BID) | ORAL | Status: DC
  Administered 2021-11-15 – 2021-11-16 (×2): 40 mg via ORAL
  Filled 2021-11-15 (×2): qty 20

## 2021-11-15 NOTE — Progress Notes (Signed)
Speech Language Pathology Treatment: Dysphagia  Patient Details Name: Sherry Baker MRN: [id number] DOB: 1965-11-13 Today's Date: 11/15/2021 Time: 1210-1225 SLP Time Calculation (min) (ACUTE ONLY): 15 min  Assessment / Plan / Recommendation Clinical Impression  Sherry Baker was calm and interactive today.  Her cortrak came out at some point yesterday.  Today she was provided with trials of regular solids and thin liquids and did well with no concerns for aspiration and improved overall attention. Dysphagia appears to have resolved. Pt continues with confusion and would benefit from 24 hour supervision for safety.  There are no further swallowing needs - our service will sign off.   HPI HPI: 56 yo admitted 12/22 with AMS and encephalopathy. Pt with aspiration requiring intubation 12/28. Extubated 12/30. Notes indicate she had glottic edema at time of intubation. PMhx: ETOH cirrhosis, polysubstance abuse, bipolar, CHF, CKD, HTn, anxiety, rectal CA s/p ostomy. MBSS completed in Nov, which was WNL.      SLP Plan  All goals met      Recommendations for follow up therapy are one component of a multi-disciplinary discharge planning process, led by the attending physician.  Recommendations may be updated based on patient status, additional functional criteria and insurance authorization.    Recommendations  Diet recommendations: Regular;Thin liquid Liquids provided via: Cup;Straw Medication Administration: Whole meds with puree Supervision:  (set up tray) Postural Changes and/or Swallow Maneuvers: Seated upright 90 degrees                Oral Care Recommendations: Oral care BID Follow Up Recommendations: No SLP follow up for swallowing; would need frequent or constant supervision upon D/C given her persisting confusion.  Plan: All goals met         Sherry Baker L. Tivis Ringer, [place] CCC/SLP Acute Rehabilitation Services Office number [phone] Pager [phone]   Sherry Baker  Sherry Baker  11/15/2021, 12:28 PM

## 2021-11-15 NOTE — Plan of Care (Signed)
Pt alert and oriented to self place. Confused to time, situation at times. Pt has received one dose of tylenol this shift thus far. Ostomy bag changed this shift and foam dsg to stg 3 to sacrum. Pt ambulating to BR with walker and 1 assist. Pt has to be coached on using walker correctly.  Problem: Education: Goal: Knowledge of General Education information will improve Description: Including pain rating scale, medication(s)/side effects and non-pharmacologic comfort measures Outcome: Progressing   Problem: Health Behavior/Discharge Planning: Goal: Ability to manage health-related needs will improve Outcome: Progressing   Problem: Clinical Measurements: Goal: Ability to maintain clinical measurements within normal limits will improve Outcome: Progressing Goal: Will remain free from infection Outcome: Progressing Goal: Diagnostic test results will improve Outcome: Progressing Goal: Respiratory complications will improve Outcome: Progressing Goal: Cardiovascular complication will be avoided Outcome: Progressing   Problem: Activity: Goal: Risk for activity intolerance will decrease Outcome: Progressing   Problem: Nutrition: Goal: Adequate nutrition will be maintained Outcome: Progressing   Problem: Coping: Goal: Level of anxiety will decrease Outcome: Progressing   Problem: Elimination: Goal: Will not experience complications related to bowel motility Outcome: Progressing Goal: Will not experience complications related to urinary retention Outcome: Progressing   Problem: Pain Managment: Goal: General experience of comfort will improve Outcome: Progressing   Problem: Safety: Goal: Ability to remain free from injury will improve Outcome: Progressing   Problem: Skin Integrity: Goal: Risk for impaired skin integrity will decrease Outcome: Progressing

## 2021-11-15 NOTE — Progress Notes (Signed)
Physical Therapy Treatment Patient Details Name: ROSHELL BRIGHAM MRN: [id number] DOB: 1966-10-08 Today's Date: 11/15/2021   History of Present Illness 56 yo admitted 12/22 with AMS and encephalopathy. Pt with aspiration requiring intubation 12/28. Extubated 12/30. PMhx: ETOH cirrhosis, polysubstance abuse, bipolar, CHF, CKD, HTn, anxiety, rectal CA s/p ostomy.    PT Comments    Pt was seen for mobility in her room with cues for safety and control of balance. Pt is impulsive today, trying to rush to bathroom, stating she was just getting ready to call nursing when PT arrived.  Slowed her down to try to be up due to mult issues with floor pad and lines, and pt declined walker but with HHA could maneuver.  Her need for AD will need to be addressed in rehab.  Pt is asking to go home but will need to be supervised with assistance for all mobility 24/7, a need that is best served in SNF.  PT concerns are about HR which was 130 at max during gait, her balance with listing to L side during standing activity, and her lack of self awareness of her safety issues.   Pt was definitely more controlled to move with this session than description of last one, but will recommend her to have the inpatient care before returning home due to safety and limits of her ability to control balance and accept help.    Recommendations for follow up therapy are one component of a multi-disciplinary discharge planning process, led by the attending physician.  Recommendations may be updated based on patient status, additional functional criteria and insurance authorization.  Follow Up Recommendations  Skilled nursing-short term rehab (<3 hours/day)     Assistance Recommended at Discharge Frequent or constant Supervision/Assistance  Patient can return home with the following A lot of help with walking and/or transfers;A lot of help with bathing/dressing/bathroom;Assistance with cooking/housework;Assist for transportation;Help with  stairs or ramp for entrance   Equipment Recommendations  Rolling walker (2 wheels);BSC/3in1    Recommendations for Other Services       Precautions / Restrictions Precautions Precautions: Fall Precaution Comments: colostomy Restrictions Weight Bearing Restrictions: No Other Position/Activity Restrictions: pt is struggling with repositioning herself but also safety awareness with standing, lists to L side upon standing     Mobility  Bed Mobility Overal bed mobility: Needs Assistance Bed Mobility: Supine to Sit;Sit to Supine Rolling: Min guard   Supine to sit: Min guard Sit to supine: Min guard   General bed mobility comments: min guard fro safety and reminders of her lines, need to wait    Transfers Overall transfer level: Needs assistance Equipment used: 1 person hand held assist Transfers: Sit to/from Stand Sit to Stand: Min assist           General transfer comment: min assist to steady her, but can stand and take steps with one person.    Ambulation/Gait Ambulation/Gait assistance: Min assist Gait Distance (Feet): 25 Feet Assistive device: 1 person hand held assist Gait Pattern/deviations: Shuffle;Decreased stride length;Step-through pattern;Trunk flexed;Wide base of support Gait velocity: decreased Gait velocity interpretation: <1.8 ft/sec, indicate of risk for recurrent falls Pre-gait activities: pt did not allow for much work pregait, standing balance control General Gait Details: pt is more controlled today, requires one person assist but wouldlook more controlled with RW.  Pt adamantely refused a walker   Stairs             Wheelchair Mobility    Modified Rankin (Stroke Patients Only)  Balance Overall balance assessment: Needs assistance Sitting-balance support: Feet supported Sitting balance-Leahy Scale: Fair     Standing balance support: Bilateral upper extremity supported;Single extremity supported;During functional  activity Standing balance-Leahy Scale: Poor Standing balance comment: reaching for objects with LUE and RUE with PT, declines to try a walker                            Cognition Arousal/Alertness: Awake/alert Behavior During Therapy: Impulsive;Anxious Overall Cognitive Status: No family/caregiver present to determine baseline cognitive functioning Area of Impairment: Problem solving;Awareness;Following commands;Safety/judgement;Attention;Memory;Orientation                 Orientation Level: Place;Time;Situation Current Attention Level: Selective Memory: Decreased recall of precautions Following Commands: Follows one step commands with increased time Safety/Judgement: Decreased awareness of safety;Decreased awareness of deficits Awareness: Emergent;Intellectual Problem Solving: Slow processing;Difficulty sequencing;Requires verbal cues;Requires tactile cues General Comments: pt was agitated mildly when PT arrived, reporting she needed to get to the bathroom.  Dense repetitive reminders to wait for PT as she had mult geographical and line barriers to getting OOB quickly        Exercises      General Comments General comments (skin integrity, edema, etc.): pt was able to assist movement, impulsive and difficult to assist, and asked PT not to do any more with her once she was up to side of bed to go to bathroom.  Did not follow the line of discussion well about her mobility goals      Pertinent Vitals/Pain Pain Assessment: No/denies pain Faces Pain Scale: No hurt    Home Living                          Prior Function            PT Goals (current goals can now be found in the care plan section) Acute Rehab PT Goals Patient Stated Goal: home    Frequency    Min 2X/week      PT Plan Current plan remains appropriate;Equipment recommendations need to be updated    Co-evaluation              AM-PAC PT "6 Clicks" Mobility   Outcome  Measure  Help needed turning from your back to your side while in a flat bed without using bedrails?: A Little Help needed moving from lying on your back to sitting on the side of a flat bed without using bedrails?: A Little Help needed moving to and from a bed to a chair (including a wheelchair)?: A Little Help needed standing up from a chair using your arms (e.g., wheelchair or bedside chair)?: A Little Help needed to walk in hospital room?: A Lot Help needed climbing 3-5 steps with a railing? : Total 6 Click Score: 15    End of Session Equipment Utilized During Treatment: Gait belt Activity Tolerance: Treatment limited secondary to agitation Patient left: in bed;with call bell/phone within reach;with restraints reapplied;with bed alarm set Nurse Communication: Mobility status;Other (comment) (discussion with nursing about her conversation about leaving) PT Visit Diagnosis: Difficulty in walking, not elsewhere classified (R26.2);Other symptoms and signs involving the nervous system (R29.898);Other abnormalities of gait and mobility (R26.89);Unsteadiness on feet (R26.81)     Time: 1914-7829 PT Time Calculation (min) (ACUTE ONLY): 26 min  Charges:  $Gait Training: 8-22 mins $Therapeutic Activity: 8-22 mins           Rod Holler  Bronson Curb 11/15/2021, 12:57 PM  Mee Hives, PT PhD Acute Rehab Dept. Number: [place] and [place]

## 2021-11-15 NOTE — TOC Transition Note (Addendum)
Transition of Care [facility]) - CM/SW Discharge Note   Patient Details  Name: Sherry Baker MRN: [id number] Date of Birth: 08/07/1966  [facility] CM/SW Contact:  Verdell Carmine, RN Phone Number: 11/15/2021, 10:52 AM   Clinical Narrative:     Reviewed chart, spoke to [name], listed as contact. She states that [name] has been this way since the age 56. She had rectal cancer, and after that is "going to do her own thing" She is on alcohol and drugs, and this is the way she wants to live her life. She lives with boyfriend and other friends.  She will be picked up by her Aunt. She will need dressings for changes. And a new walker, as the old one broke. Did state this may have to be out of pocket if the other one was recently provided. Crystal states there have been issues with bedbugs in the home, I asked about hospice care, however  Crystal states the environment is not safe for people to go into.  Walker ordered from [employer]. Information given to MD regarding no home health likely to accept given conditions and insurance.  [facility] [employer] called to state patient had refused walker.   Final next level of care: Home/Self Care Barriers to Discharge: No SNF bed (no snf will accept)   Patient Goals and CMS Choice        Discharge Placement                       Discharge Plan and Services In-house Referral: Clinical Social Work                                   Social Determinants of Health (SDOH) Interventions     Readmission Risk Interventions Readmission Risk Prevention Plan 09/30/2021 09/24/2021  Transportation Screening Complete Complete  Medication Review Press photographer) Complete Complete  PCP or Specialist appointment within 3-5 days of discharge Complete Complete  HRI or [place] Not Complete Not Complete  HRI or Home Care Consult Pt Refusal Comments - history of unsafe home environment  SW Recovery Care/Counseling Consult  Complete Complete  Palliative Care Screening Not Applicable Not Applicable  Skilled Nursing Facility Not Applicable Complete  Some recent data might be hidden

## 2021-11-15 NOTE — Progress Notes (Signed)
PROGRESS NOTE    Sherry Baker  JSE:[id number] DOB: 11-16-1965 DOA: 10/24/2021 PCP: Pcp, No   Chief Complain:AMS  Brief Narrative: Patient is a 56 year old female with history of alcohol dependence, alcohol liver cirrhosis, polysubstance abuse was brought to the emergency department on 12/22 with altered mental status.  She was found to be agitated, screaming.  UDS was positive for cocaine, cannabis.  Hospital course remarkable for persistent delirium, encephalopathy regarding Ativan.  PCCM was also consulted and she was started on Precedex infusion, Librium taper.  On 12/28, patient had an aspiration event, became hypoxic and intubated.  She was noted to have glottic edema.  Unasyn was started.  She was extubated on 12/30.  Mental status has improved, with oral intake improvement. PT/OT recommending skilled nursing facility on discharge.She denies going to skilled nursing facility.  TOC says home health cannot be arranged. Creatinine noted to have increased today. Discharge planning to home tomorrow if there is improvement in kidney function.  Assessment & Plan:   Principal Problem:   Acute encephalopathy Active Problems:   Substance abuse (HCC)   Depression   Thrombocytopenia (HCC)   Pressure injury of skin   Alcohol withdrawal (HCC)   Alcohol withdrawal delirium (HCC)   Acute respiratory failure with hypoxia (HCC)   Alcohol use disorder, severe, dependence ([place])   Acute toxic metabolic encephalopathy: Secondary to alcohol withdrawal, polysubstance abuse.  CT head did not show negative findings.  She was started on clonidine, Librium, phenobarbital all of them now stopped.  Received 3 days of high-dose thiamine.This morning she was alert and oriented.   Acute respiratory failure with hypoxia: Secondary to aspiration pneumonia/pneumonitis.  She was also found to have glottic edema at the time of intubation.  Currently extubated.  Completed 5 days course of Unasyn.  Currently on room  air.  Dysphagia/severe malnutrition: She  on tube feeding.  She accidentally took the tube out.  Currently eating by mouth.  No signs of dysphagia  CKD stage IIIa: Baseline creatinine ranged from 1.3-1.4.  Creatinine increased to the level of 2 today.  Started on gentle IV fluids.  Check BMP tomorrow  Alcohol liver cirrhosis: Ammonia level normal.  Continue lactulose  Chronic diastolic congestive heart failure/hypertension:   Monitor blood pressure.  Continue current medications, on carvedilol  Hyperglycemia:  Currently stable.  Continue sliding scale  Normocytic anemia: No evidence of bleeding.  Anemia panel was checked and found to be unremarkable.  Continue to monitor hemoglobin.  Currently stable  History of rectal cancer: Status postchemotherapy, has colostomy bag  Pruritic macular rash: On the back.  Continue hydrocortisone cream,improved now  Debility/deconditioning: PT/OT recommending skilled nursing facility at discharge.  Patient is reluctant and states she wants to go home  Pressure Injury 10/25/21 Sacrum Medial Stage 3 -  Full thickness tissue loss. Subcutaneous fat may be visible but bone, tendon or muscle are NOT exposed. (Active)  10/25/21 1421  Location: Sacrum  Location Orientation: Medial  Staging: Stage 3 -  Full thickness tissue loss. Subcutaneous fat may be visible but bone, tendon or muscle are NOT exposed.  Wound Description (Comments):   Present on Admission: Yes          Nutrition Problem: Severe Malnutrition Etiology: chronic illness (cirrhosis, polysubstance abuse)      DVT prophylaxis:Heparin [place] Code Status: Full Family Communication: None at bedside Patient status:Inpatient  Dispo: The patient is from: Home              Anticipated  d/c is to: SNF vs HH              Anticipated d/c date is: tomorrow  Consultants: PCCM  Procedures:Intubation  Antimicrobials:  Anti-infectives (From admission, onward)    Start     Dose/Rate Route  Frequency Ordered Stop   10/30/21 2015  Ampicillin-Sulbactam (UNASYN) 3 g in sodium chloride 0.9 % 100 mL IVPB        3 g 200 mL/hr over 30 Minutes Intravenous Every 8 hours 10/30/21 1919 11/03/21 2108   10/28/21 0600  cefTRIAXone (ROCEPHIN) 2 g in sodium chloride 0.9 % 100 mL IVPB        2 g 200 mL/hr over 30 Minutes Intravenous Every 24 hours 10/27/21 1019 10/29/21 0542   10/26/21 0600  cefTRIAXone (ROCEPHIN) 1 g in sodium chloride 0.9 % 100 mL IVPB  Status:  Discontinued        1 g 200 mL/hr over 30 Minutes Intravenous Every 24 hours 10/25/21 0328 10/27/21 1019   10/25/21 0230  cefTRIAXone (ROCEPHIN) 2 g in sodium chloride 0.9 % 100 mL IVPB        2 g 200 mL/hr over 30 Minutes Intravenous  Once 10/25/21 0223 10/25/21 0305       Subjective: Patient seen and examined at the bedside this morning.  Hemodynamically stable.  Alert and oriented, on room air.  Very eager to go home.  She does not want to consider skilled nursing facility.  I called her on the phone later today and discussed that she needs to be in the hospital at least today because her kidney function has worsened and she needs some IV fluids.  She declines and states she will sign AMA  Objective: Vitals:   11/15/21 0029 11/15/21 0415 11/15/21 0423 11/15/21 0823  BP: (!) 110/91 (!) 108/57  126/76  Pulse:  99  95  Resp: _0 Temp: 98 F (36.7 C) 97.7 F (36.5 C)  97.9 F (36.6 C)  TempSrc: Oral Oral  Oral  SpO2: 98% 98%  96%  Weight:   44 kg   Height:        Intake/Output Summary (Last 24 hours) at 11/15/2021 1122 Last data filed at 11/15/2021 0948 Gross per 24 hour  Intake 960 ml  Output --  Net 960 ml   Filed Weights   11/13/21 2002 11/14/21 0345 11/15/21 0423  Weight: 45.6 kg 43.8 kg 44 kg    Examination:   General exam: Very deconditioned, chronically ill looking, weak HEENT: PERRL Respiratory system:  no wheezes or crackles  Cardiovascular system: S1 & S2 heard, RRR.  Gastrointestinal  system: Abdomen is nondistended, soft and nontender.  Colostomy Central nervous system: Alert and oriented Extremities: No edema, no clubbing ,no cyanosis Skin: No rashes, no ulcers,no icterus     Data Reviewed: I have personally reviewed following labs and imaging studies  CBC: Recent Labs  Lab 11/10/21 0013 11/13/21 0236  WBC 5.3 5.8  HGB 10.3* 10.5*  HCT 32.6* 32.2*  MCV 88.3 87.7  PLT 301 161   Basic Metabolic Panel: Recent Labs  Lab 11/10/21 0013 11/12/21 0044 11/13/21 0236 11/15/21 0030  NA 137 134* 133* 138  K 3.7 4.3 3.8 4.1  CL 101 98 101 102  CO2 _1 GLUCOSE 140* 103* 99 94  BUN 42* 50* 52* 64*  CREATININE 1.60* 1.67* 1.43* 2.12*  CALCIUM 9.7 9.5 9.4 9.7  MG 2.3  --   --   --  GFR: Estimated Creatinine Clearance: 21.1 mL/min (A) (by C-G formula based on SCr of 2.12 mg/dL (H)). Liver Function Tests: Recent Labs  Lab 11/10/21 0013  AST 19  ALT 20  ALKPHOS 69  BILITOT 0.4  PROT 7.0  ALBUMIN 2.8*   No results for input(s): LIPASE, AMYLASE in the last 168 hours. Recent Labs  Lab 11/10/21 0013  AMMONIA 24   Coagulation Profile: No results for input(s): INR, PROTIME in the last 168 hours. Cardiac Enzymes: No results for input(s): CKTOTAL, CKMB, CKMBINDEX, TROPONINI in the last 168 hours. BNP (last 3 results) No results for input(s): PROBNP in the last 8760 hours. HbA1C: No results for input(s): HGBA1C in the last 72 hours. CBG: Recent Labs  Lab 11/14/21 0344 11/14/21 0744 11/14/21 1208 11/14/21 1606 11/14/21 1917  GLUCAP 114* 77 102* 101* 109*   Lipid Profile: No results for input(s): CHOL, HDL, LDLCALC, TRIG, CHOLHDL, LDLDIRECT in the last 72 hours. Thyroid Function Tests: No results for input(s): TSH, T4TOTAL, FREET4, T3FREE, THYROIDAB in the last 72 hours. Anemia Panel: No results for input(s): VITAMINB12, FOLATE, FERRITIN, TIBC, IRON, RETICCTPCT in the last 72 hours. Sepsis Labs: No results for input(s): PROCALCITON,  LATICACIDVEN in the last 168 hours.  No results found for this or any previous visit (from the past 240 hour(s)).       Radiology Studies: No results found.      Scheduled Meds:  busPIRone  15 mg Oral BID   carvedilol  3.125 mg Per Tube BID WC   chlorhexidine gluconate (MEDLINE KIT)  15 mL Mouth Rinse BID   Chlorhexidine Gluconate Cloth  6 each Topical Daily   citalopram  10 mg Oral Daily   docusate  100 mg Per Tube BID   feeding supplement (OSMOLITE 1.5 CAL)  660 mL Per Tube S56C   folic acid  1 mg Per Tube Daily   gabapentin  100 mg Oral TID   heparin  5,000 Units Subcutaneous Q8H   hydrocortisone cream   Topical TID   lactulose  10 g Per Tube BID   mouth rinse  15 mL Mouth Rinse QID   multivitamin with minerals  1 tablet Oral Daily   nicotine  21 mg Transdermal Daily   pantoprazole sodium  40 mg Per Tube BID   polyethylene glycol  17 g Per Tube Daily   thiamine  100 mg Per Tube Daily   Continuous Infusions:  sodium chloride 10 mL/hr at 10/29/21 1800   sodium chloride       LOS: 21 days    Time spent: 25 mins.More than 50% of that time was spent in counseling and/or coordination of care.      Shelly Coss, MD [facility] P1/13/2023, 11:22 AM

## 2021-11-16 LAB — BASIC METABOLIC PANEL
Anion gap: 9 (ref 5–15)
BUN: 55 mg/dL — ABNORMAL HIGH (ref 6–20)
CO2: 24 mmol/L (ref 22–32)
Calcium: 9 mg/dL (ref 8.9–10.3)
Chloride: 106 mmol/L (ref 98–111)
Creatinine, Ser: 1.63 mg/dL — ABNORMAL HIGH (ref 0.44–1.00)
GFR, Estimated: 37 mL/min — ABNORMAL LOW (ref >60)
Glucose, Bld: 83 mg/dL (ref 70–99)
Potassium: 3.6 mmol/L (ref 3.5–5.1)
Sodium: 139 mmol/L (ref 135–145)

## 2021-11-16 MED ORDER — BUSPIRONE HCL 15 MG PO TABS: 15.0000 mg | ORAL_TABLET | Freq: Two times a day (BID) | ORAL | 1 refills | Status: DC

## 2021-11-16 MED ORDER — CITALOPRAM HYDROBROMIDE 10 MG PO TABS: 10.0000 mg | ORAL_TABLET | Freq: Every day | ORAL | 1 refills | Status: DC

## 2021-11-16 MED ORDER — CARVEDILOL 3.125 MG PO TABS: 3.1250 mg | ORAL_TABLET | Freq: Two times a day (BID) | ORAL | 1 refills | Status: DC

## 2021-11-16 MED ORDER — FOLIC ACID 1 MG PO TABS: 1.0000 mg | ORAL_TABLET | Freq: Every day | ORAL | 1 refills | Status: DC

## 2021-11-16 MED ORDER — PANTOPRAZOLE SODIUM 40 MG PO TBEC: 40.0000 mg | DELAYED_RELEASE_TABLET | Freq: Two times a day (BID) | ORAL | 1 refills | Status: DC

## 2021-11-16 MED ORDER — GABAPENTIN 100 MG PO CAPS: 100.0000 mg | ORAL_CAPSULE | Freq: Three times a day (TID) | ORAL | 0 refills | Status: DC

## 2021-11-16 MED ORDER — THIAMINE HCL 100 MG PO TABS: 100.0000 mg | ORAL_TABLET | Freq: Every day | ORAL | 1 refills | Status: DC

## 2021-11-16 MED ORDER — LACTULOSE 10 GM/15ML PO SOLN: 10.0000 g | Freq: Two times a day (BID) | ORAL | 1 refills | Status: DC

## 2021-11-16 MED ORDER — NICOTINE 21 MG/24HR TD PT24: 21.0000 mg | MEDICATED_PATCH | Freq: Every day | TRANSDERMAL | 0 refills | Status: DC

## 2021-11-16 MED ORDER — AMLODIPINE BESYLATE 10 MG PO TABS: 10.0000 mg | ORAL_TABLET | Freq: Every day | ORAL | 1 refills | Status: DC
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
## 2021-11-16 NOTE — Plan of Care (Signed)
Problem: Health Behavior/Discharge Planning: Goal: Ability to manage health-related needs will improve Outcome: Completed/Met  Patient discharged to go home today. Aunt will pick patient up.

## 2021-11-16 NOTE — Progress Notes (Signed)
Nsg Discharge Note  Admit Date:  10/24/2021 Discharge date: 11/16/2021   Mamie Nick to be D/C'd Home per MD order.  AVS completed.  Patient/caregiver able to verbalize understanding.  Discharge Medication: Allergies as of 11/16/2021   No Known Allergies      Medication List     STOP taking these medications    omeprazole 40 MG capsule Commonly known as: PRILOSEC       TAKE these medications    acetaminophen 325 MG tablet Commonly known as: TYLENOL Take 2 tablets (650 mg total) by mouth every 6 (six) hours as needed for mild pain, headache or fever.   amLODipine 10 MG tablet Commonly known as: NORVASC Take 1 tablet (10 mg total) by mouth daily.   busPIRone 15 MG tablet Commonly known as: BUSPAR Take 1 tablet (15 mg total) by mouth 2 (two) times daily.   carvedilol 3.125 MG tablet Commonly known as: COREG Take 1 tablet (3.125 mg total) by mouth 2 (two) times daily with a meal. What changed: how much to take   citalopram 10 MG tablet Commonly known as: CELEXA Take 1 tablet (10 mg total) by mouth daily.   diclofenac Sodium 1 % Gel Commonly known as: VOLTAREN Apply 4 g topically 4 (four) times daily.   folic acid 1 MG tablet Commonly known as: FOLVITE Take 1 tablet (1 mg total) by mouth daily. Start taking on: November 17, 2021   gabapentin 100 MG capsule Commonly known as: NEURONTIN Take 1 capsule (100 mg total) by mouth 3 (three) times daily.   hydrOXYzine 10 MG tablet Commonly known as: ATARAX Take 1 tablet (10 mg total) by mouth 3 (three) times daily as needed. As needed for acute anxiety   lactulose 10 GM/15ML solution Commonly known as: CHRONULAC Take 15 mLs (10 g total) by mouth 2 (two) times daily.   multivitamin with minerals Tabs tablet Place 1 tablet into feeding tube daily. What changed: how to take this   nicotine 21 mg/24hr patch Commonly known as: NICODERM CQ - dosed in mg/24 hours Place 1 patch (21 mg total) onto the skin  daily. Start taking on: November 17, 2021   pantoprazole 40 MG tablet Commonly known as: Protonix Take 1 tablet (40 mg total) by mouth 2 (two) times daily.   thiamine 100 MG tablet Take 1 tablet (100 mg total) by mouth daily.   traMADol 50 MG tablet Commonly known as: ULTRAM Take 1 tablet (50 mg total) by mouth every 6 (six) hours as needed for moderate pain.   Vitamin D3 25 MCG tablet Commonly known as: Vitamin D Take 1 tablet (1,000 Units total) by mouth daily.               Durable Medical Equipment  (From admission, onward)           Start     Ordered   11/15/21 1050  For home use only DME Walker rolling  Once       Question Answer Comment  Walker: With 5 Inch Wheels   Patient needs a walker to treat with the following condition Weakness      11/15/21 1050            Discharge Assessment: Vitals:   11/16/21 0821 11/16/21 1208  BP: (!) 159/90 134/86  Pulse: 100 100  Resp: 17 16  Temp: 98.3 F (36.8 C) 98.7 F (37.1 C)  SpO2: 96% 98%   Skin clean, dry and intact without evidence of  skin break down, no evidence of skin tears noted. IV catheter discontinued intact. Site without signs and symptoms of complications - no redness or edema noted at insertion site, patient denies c/o pain - only slight tenderness at site.  Dressing with slight pressure applied.  D/c Instructions-Education: Discharge instructions given to patient/family with verbalized understanding. D/c education completed with patient/family including follow up instructions, medication list, d/c activities limitations if indicated, with other d/c instructions as indicated by MD - patient able to verbalize understanding, all questions fully answered. Patient instructed to return to ED, call 911, or call MD for any changes in condition.  Patient escorted via [place], and D/C home via private auto.  Jontavia Leatherbury, Jolene Schimke, RN 11/16/2021 1:22 PM

## 2021-11-16 NOTE — Discharge Summary (Signed)
Physician Discharge Summary  Sherry Baker GLO:[id number] DOB: 10/17/1966 DOA: 10/24/2021  PCP: Merryl Hacker, No  Admit date: 10/24/2021 Discharge date: 11/16/2021  Admitted From: Home Disposition:  Home  Discharge Condition:Stable CODE STATUS:FULL Diet recommendation:  Regular   Brief/Interim Summary:  Patient is a 56 year old female with history of alcohol dependence, alcohol liver cirrhosis, polysubstance abuse was brought to the emergency department on 12/22 with altered mental status.  She was found to be agitated, screaming.  UDS was positive for cocaine, cannabis.  Hospital course remarkable for persistent delirium, encephalopathy regarding Ativan.  PCCM was also consulted and she was started on Precedex infusion, Librium taper.  On 12/28, patient had an aspiration event, became hypoxic and intubated.  She was noted to have glottic edema.  Unasyn was started.  She was extubated on 12/30.  Mental status has improved, with oral intake improvement. PT/OT recommending skilled nursing facility on discharge.She denies going to skilled nursing facility.  Patient being discharged to home with home health.  Following problems were addressed during her hospitalization:  Acute toxic metabolic encephalopathy: Secondary to alcohol withdrawal, polysubstance abuse.  CT head did not show negative findings.  She was started on clonidine, Librium, phenobarbital all of them now stopped.  Received 3 days of high-dose thiamine.This morning she was alert and oriented.  Continue thiamine and folic acid on discharge.   Acute respiratory failure with hypoxia: Secondary to aspiration pneumonia/pneumonitis.  She was also found to have glottic edema at the time of intubation.  Currently extubated.  Completed 5 days course of Unasyn.  Currently on room air.   Dysphagia/severe malnutrition: She  on tube feeding.  She accidentally took the tube out.  Currently eating by mouth.  No signs of dysphagia   CKD stage IIIa:  Baseline creatinine ranged from 1.3-1.4.  Kidney function at baseline.  Check BMP in a week  Alcohol liver cirrhosis: Ammonia level normal.  Continue lactulose   Chronic diastolic congestive heart failure/hypertension:  Continue current medications, on carvedilol, amlodipine   Normocytic anemia: No evidence of bleeding.  Anemia panel was checked and found to be unremarkable.  Hemoglobin stable   History of rectal cancer: Status postchemotherapy, has colostomy bag   Pruritic macular rash: On the back.  Started on hydrocortisone cream,improved now   Debility/deconditioning: PT/OT recommending skilled nursing facility at discharge.  Patient is reluctant and states she wants to go home.  Home health ordered   Pressure Injury 10/25/21 Sacrum Medial Stage 3 -  Full thickness tissue loss. Subcutaneous fat may be visible but bone, tendon or muscle are NOT exposed. (Active)  10/25/21 1421  Location: Sacrum  Location Orientation: Medial  Staging: Stage 3 -  Full thickness tissue loss. Subcutaneous fat may be visible but bone, tendon or muscle are NOT exposed.  Wound Description (Comments):   Present on Admission: Yes      Discharge Diagnoses:  Principal Problem:   Acute encephalopathy Active Problems:   Substance abuse (HCC)   Depression   Thrombocytopenia (HCC)   Pressure injury of skin   Alcohol withdrawal (HCC)   Alcohol withdrawal delirium (HCC)   Acute respiratory failure with hypoxia (HCC)   Alcohol use disorder, severe, dependence ([facility])    Discharge Instructions  Discharge Instructions     Diet general   Complete by: As directed    Discharge instructions   Complete by: As directed    1)Please take prescribed medications as instructed 2)Quit alcohol and smoking 3)Follow up with a PCP in a week.Do  a BMP test to check your kidney function during the follow-up   Increase activity slowly   Complete by: As directed    No wound care   Complete by: As directed    As per  wound care      Allergies as of 11/16/2021   No Known Allergies      Medication List     STOP taking these medications    omeprazole 40 MG capsule Commonly known as: PRILOSEC       TAKE these medications    acetaminophen 325 MG tablet Commonly known as: TYLENOL Take 2 tablets (650 mg total) by mouth every 6 (six) hours as needed for mild pain, headache or fever.   amLODipine 10 MG tablet Commonly known as: NORVASC Take 1 tablet (10 mg total) by mouth daily.   busPIRone 15 MG tablet Commonly known as: BUSPAR Take 1 tablet (15 mg total) by mouth 2 (two) times daily.   carvedilol 3.125 MG tablet Commonly known as: COREG Take 1 tablet (3.125 mg total) by mouth 2 (two) times daily with a meal. What changed: how much to take   citalopram 10 MG tablet Commonly known as: CELEXA Take 1 tablet (10 mg total) by mouth daily.   diclofenac Sodium 1 % Gel Commonly known as: VOLTAREN Apply 4 g topically 4 (four) times daily.   folic acid 1 MG tablet Commonly known as: FOLVITE Take 1 tablet (1 mg total) by mouth daily. Start taking on: November 17, 2021   gabapentin 100 MG capsule Commonly known as: NEURONTIN Take 1 capsule (100 mg total) by mouth 3 (three) times daily.   hydrOXYzine 10 MG tablet Commonly known as: ATARAX Take 1 tablet (10 mg total) by mouth 3 (three) times daily as needed. As needed for acute anxiety   lactulose 10 GM/15ML solution Commonly known as: CHRONULAC Take 15 mLs (10 g total) by mouth 2 (two) times daily.   multivitamin with minerals Tabs tablet Place 1 tablet into feeding tube daily. What changed: how to take this   nicotine 21 mg/24hr patch Commonly known as: NICODERM CQ - dosed in mg/24 hours Place 1 patch (21 mg total) onto the skin daily. Start taking on: November 17, 2021   pantoprazole 40 MG tablet Commonly known as: Protonix Take 1 tablet (40 mg total) by mouth 2 (two) times daily.   thiamine 100 MG tablet Take 1 tablet (100  mg total) by mouth daily.   traMADol 50 MG tablet Commonly known as: ULTRAM Take 1 tablet (50 mg total) by mouth every 6 (six) hours as needed for moderate pain.   Vitamin D3 25 MCG tablet Commonly known as: Vitamin D Take 1 tablet (1,000 Units total) by mouth daily.               Durable Medical Equipment  (From admission, onward)           Start     Ordered   11/15/21 1050  For home use only DME Walker rolling  Once       Question Answer Comment  Walker: With 5 Inch Wheels   Patient needs a walker to treat with the following condition Weakness      11/15/21 [place] Follow up.   Why: To establish primary care doctor Contact information: [address] [place] [address] [phone]  No Known Allergies  Consultations: PCCM   Procedures/Studies: DG Abd 1 View  Result Date: 10/30/2021 CLINICAL DATA:  NG tube placement. EXAM: ABDOMEN - 1 VIEW COMPARISON:  One-view abdomen 10/26/2021 FINDINGS: Heart is mildly enlarged. Mild pulmonary vascular congestion is present. Lung bases are otherwise clear. Small bore feeding tube is now in place. The tip is in the distal stomach. Visualized bowel gas pattern is within normal limits. IMPRESSION: Small bore feeding tube in place with the tip in the distal stomach. Electronically Signed   By: San Morelle M.D.   On: 10/30/2021 15:59   CT Head Wo Contrast  Result Date: 10/25/2021 CLINICAL DATA:  Mental status change, unknown cause. EXAM: CT HEAD WITHOUT CONTRAST TECHNIQUE: Contiguous axial images were obtained from the base of the skull through the vertex without intravenous contrast. COMPARISON:  09/05/2021. FINDINGS: Brain: No acute intracranial hemorrhage, midline shift or mass effect. No extra-axial fluid collection. Periventricular and subcortical white matter hypodensities are noted  bilaterally. There is no hydrocephalus. There is a hypodensity in the basal ganglia on the right, suggesting old lacunar infarct. Mild atrophy is noted. Vascular: Atherosclerotic calcification of the carotid siphons. No hyperdense vessel. Skull: Normal. Negative for fracture or focal lesion. Sinuses/Orbits: There is partial opacification of the mastoid air cells on the left. Mucosal thickening is noted in the maxillary sinuses bilaterally and ethmoid air cells on the right. No acute orbital abnormality. Other: None. IMPRESSION: 1. No acute intracranial process. 2. 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 3. Bilateral maxillary and right ethmoid sinus disease. 4. Partial opacification of the mastoid air cells on the left, unchanged from the prior exam. Electronically Signed   By: Brett Fairy M.D.   On: 10/25/2021 01:29   US Abdomen Complete  Result Date: 10/27/2021 CLINICAL DATA:  Alcohol use disorder severe with dependence, altered mental status, thrombocytopenia, history of rectal cancer, cirrhosis EXAM: ABDOMEN ULTRASOUND COMPLETE COMPARISON:  CT abdomen and pelvis 12/18/2018 FINDINGS: Gallbladder: Debris/sludge within gallbladder. Mild gallbladder wall thickening. Tiny dependent shadowing calculi up to 6 mm. No pericholecystic fluid or sonographic Murphy sign. Common bile duct: Diameter: 5 mm, normal Liver: Normal echogenicity without mass or nodularity. Portal vein is patent on color Doppler imaging with normal direction of blood flow towards the liver. IVC: Normal appearance Pancreas: Normal appearance Spleen: Normal appearance, 8.8 cm length Right Kidney: Length: 8.6 cm. Cortical thinning. Increased cortical echogenicity. Lobulated margins. Small cyst at inferior pole 1.8 cm diameter. No definite mass or hydronephrosis. Left Kidney: Length: 11.8 cm. Normal cortical thickness. Upper normal cortical echogenicity with lobulated cortical  contour. No mass, hydronephrosis, or shadowing calcification. Abdominal aorta: Normal caliber Other findings: No free fluid IMPRESSION: Small amount of sludge and tiny calculi within gallbladder with mild gallbladder wall thickening, but no pericholecystic fluid or sonographic Murphy sign. Atrophic scarred RIGHT kidney with small cyst. Remainder of exam unremarkable. Electronically Signed   By: Lavonia Dana M.D.   On: 10/27/2021 16:50   DG CHEST PORT 1 VIEW  Result Date: 11/03/2021 CLINICAL DATA:  Encounter for aspiration pneumonitis. EXAM: PORTABLE CHEST 1 VIEW COMPARISON:  11/01/2021 FINDINGS: Small densities at the medial right lung base are minimally changed. Otherwise, the lungs are clear. Feeding tube extends into the abdomen but the tip is beyond the image. Heart and mediastinum are within normal limits. Negative for a pneumothorax. Trachea is midline. IMPRESSION: 1. No acute chest findings. Small densities at the medial right lung  base are nonspecific. Electronically Signed   By: Markus Daft M.D.   On: 11/03/2021 08:42   DG Chest Port 1 View  Result Date: 11/01/2021 CLINICAL DATA:  Pneumonia EXAM: PORTABLE CHEST 1 VIEW COMPARISON:  October 30, 2021 FINDINGS: The heart size and mediastinal contours are stable. Feeding tube is identified with distal tip not included on the film but is at least in the stomach. Previously noted airspace opacity in the right lung base is significantly decreased. There is mild linear opacity of bilateral medial lung bases, favor atelectasis. The visualized skeletal structures are unremarkable. IMPRESSION: Previously noted airspace opacity in the right lung base is significantly decreased. Electronically Signed   By: Abelardo Diesel M.D.   On: 11/01/2021 13:29   DG Chest Port 1 View  Result Date: 10/30/2021 CLINICAL DATA:  Hypoxia, intubated EXAM: PORTABLE CHEST 1 VIEW COMPARISON:  10/24/2021 FINDINGS: Single frontal view of the chest demonstrates endotracheal tube  overlying tracheal air column, tip approximately 2.4 cm from carina. Enteric catheter passes below diaphragm, tip excluded by collimation. Cardiac silhouette is stable. Interval development of patchy right basilar airspace disease. Trace right effusion is suspected. No pneumothorax. No acute bony abnormalities. IMPRESSION: 1. No complications after intubation. 2. Interval development of right basilar airspace disease which could reflect pneumonia or aspiration. Electronically Signed   By: Randa Ngo M.D.   On: 10/30/2021 19:31   DG Chest Port 1 View  Result Date: 10/24/2021 CLINICAL DATA:  Altered mental status. EXAM: PORTABLE CHEST 1 VIEW COMPARISON:  09/09/2021. FINDINGS: The heart size and mediastinal contours are within normal limits. No consolidation, effusion, or pneumothorax. No acute osseous abnormality. IMPRESSION: No acute cardiopulmonary process. Electronically Signed   By: Brett Fairy M.D.   On: 10/24/2021 23:30   DG Abd Portable 1V  Result Date: 10/26/2021 CLINICAL DATA:  NG tube placement EXAM: PORTABLE ABDOMEN - 1 VIEW COMPARISON:  09/13/2021 FINDINGS: NG tube tip is in the distal stomach. Nonobstructive bowel gas pattern. IMPRESSION: NG tube tip in the distal stomach. Electronically Signed   By: Rolm Baptise M.D.   On: 10/26/2021 09:59      Subjective: Patient seen and examined at the bedside this morning.  Hemodynamically stable for discharge today.  Discharge Exam: Vitals:   11/16/21 0402 11/16/21 0821  BP: (!) 143/73 (!) 159/90  Pulse:  100  Resp: 18 17  Temp: 98.6 F (37 C) 98.3 F (36.8 C)  SpO2: 98% 96%   Vitals:   11/15/21 1645 11/15/21 2004 11/16/21 0402 11/16/21 0821  BP: (!) 173/91 128/77 (!) 143/73 (!) 159/90  Pulse: (!) 114 (!) 101  100  Resp: 20 20 18 17   Temp:  98 F (36.7 C) 98.6 F (37 C) 98.3 F (36.8 C)  TempSrc:  Oral Oral Oral  SpO2:  97% 98% 96%  Weight:      Height:        General: Pt is alert, awake, not in acute  distress Cardiovascular: RRR, S1/S2 +, no rubs, no gallops Respiratory: CTA bilaterally, no wheezing, no rhonchi Abdominal: Soft, NT, ND, bowel sounds + Extremities: no edema, no cyanosis    The results of significant diagnostics from this hospitalization (including imaging, microbiology, ancillary and laboratory) are listed below for reference.     Microbiology: No results found for this or any previous visit (from the past 240 hour(s)).   Labs: BNP (last 3 results) Recent Labs    03/30/21 0153 06/23/21 0100  BNP 231.8* 485.8*   Basic  Metabolic Panel: Recent Labs  Lab 11/10/21 0013 11/12/21 0044 11/13/21 0236 11/15/21 0030 11/16/21 0109  NA 137 134* 133* 138 139  K 3.7 4.3 3.8 4.1 3.6  CL 101 98 101 102 106  CO2 24 25 22 24 24   GLUCOSE 140* 103* 99 94 83  BUN 42* 50* 52* 64* 55*  CREATININE 1.60* 1.67* 1.43* 2.12* 1.63*  CALCIUM 9.7 9.5 9.4 9.7 9.0  MG 2.3  --   --   --   --    Liver Function Tests: Recent Labs  Lab 11/10/21 0013  AST 19  ALT 20  ALKPHOS 69  BILITOT 0.4  PROT 7.0  ALBUMIN 2.8*   No results for input(s): LIPASE, AMYLASE in the last 168 hours. Recent Labs  Lab 11/10/21 0013  AMMONIA 24   CBC: Recent Labs  Lab 11/10/21 0013 11/13/21 0236  WBC 5.3 5.8  HGB 10.3* 10.5*  HCT 32.6* 32.2*  MCV 88.3 87.7  PLT 301 274   Cardiac Enzymes: No results for input(s): CKTOTAL, CKMB, CKMBINDEX, TROPONINI in the last 168 hours. BNP: Invalid input(s): POCBNP CBG: Recent Labs  Lab 11/14/21 0744 11/14/21 1208 11/14/21 1606 11/14/21 1917 11/15/21 2003  GLUCAP 77 102* 101* 109* 90   D-Dimer No results for input(s): DDIMER in the last 72 hours. Hgb A1c No results for input(s): HGBA1C in the last 72 hours. Lipid Profile No results for input(s): CHOL, HDL, LDLCALC, TRIG, CHOLHDL, LDLDIRECT in the last 72 hours. Thyroid function studies No results for input(s): TSH, T4TOTAL, T3FREE, THYROIDAB in the last 72 hours.  Invalid input(s):  FREET3 Anemia work up No results for input(s): VITAMINB12, FOLATE, FERRITIN, TIBC, IRON, RETICCTPCT in the last 72 hours. Urinalysis    Component Value Date/Time   COLORURINE YELLOW 10/25/2021 0145   APPEARANCEUR CLOUDY (A) 10/25/2021 0145   LABSPEC 1.011 10/25/2021 0145   PHURINE 7.0 10/25/2021 0145   GLUCOSEU NEGATIVE 10/25/2021 0145   HGBUR SMALL (A) 10/25/2021 0145   BILIRUBINUR NEGATIVE 10/25/2021 0145   KETONESUR 5 (A) 10/25/2021 0145   PROTEINUR 100 (A) 10/25/2021 0145   UROBILINOGEN 0.2 12/23/2017 1417   NITRITE NEGATIVE 10/25/2021 0145   LEUKOCYTESUR LARGE (A) 10/25/2021 0145   Sepsis Labs Invalid input(s): PROCALCITONIN,  WBC,  LACTICIDVEN Microbiology No results found for this or any previous visit (from the past 240 hour(s)).  Please note: You were cared for by a hospitalist during your hospital stay. Once you are discharged, your primary care physician will handle any further medical issues. Please note that NO REFILLS for any discharge medications will be authorized once you are discharged, as it is imperative that you return to your primary care physician (or establish a relationship with a primary care physician if you do not have one) for your post hospital discharge needs so that they can reassess your need for medications and monitor your lab values.    Time coordinating discharge: 40 minutes  SIGNED:   Shelly Coss, MD  [facility] 11/16/2021, 10:42 AM Pager [phone]  If 7PM-7AM, please contact night-coverage [email or web address] Password [email or web address]

## 2021-11-18 ENCOUNTER — Inpatient Hospital Stay (HOSPITAL_COMMUNITY)
Admission: EM | Admit: 2021-11-18 | Discharge: 2021-11-23 | DRG: 070 | Discharge status: Against Medical Advice | Payer: Medicaid Other | Attending: Internal Medicine | Admitting: Internal Medicine

## 2021-11-18 ENCOUNTER — Other Ambulatory Visit: Payer: Self-pay

## 2021-11-18 ENCOUNTER — Emergency Department (HOSPITAL_COMMUNITY): Payer: Medicaid Other

## 2021-11-18 ENCOUNTER — Encounter (HOSPITAL_COMMUNITY): Payer: Self-pay | Admitting: Emergency Medicine

## 2021-11-18 DIAGNOSIS — Z781 Physical restraint status: Secondary | ICD-10-CM

## 2021-11-18 DIAGNOSIS — R9431 Abnormal electrocardiogram [ECG] [EKG]: Secondary | ICD-10-CM | POA: Diagnosis present

## 2021-11-18 DIAGNOSIS — I10 Essential (primary) hypertension: Secondary | ICD-10-CM | POA: Diagnosis not present

## 2021-11-18 DIAGNOSIS — E871 Hypo-osmolality and hyponatremia: Secondary | ICD-10-CM | POA: Diagnosis present

## 2021-11-18 DIAGNOSIS — Z20822 Contact with and (suspected) exposure to covid-19: Secondary | ICD-10-CM | POA: Diagnosis present

## 2021-11-18 DIAGNOSIS — N1832 Chronic kidney disease, stage 3b: Secondary | ICD-10-CM | POA: Diagnosis present

## 2021-11-18 DIAGNOSIS — E872 Acidosis, unspecified: Secondary | ICD-10-CM | POA: Diagnosis present

## 2021-11-18 DIAGNOSIS — F1010 Alcohol abuse, uncomplicated: Secondary | ICD-10-CM | POA: Diagnosis present

## 2021-11-18 DIAGNOSIS — N179 Acute kidney failure, unspecified: Secondary | ICD-10-CM | POA: Diagnosis present

## 2021-11-18 DIAGNOSIS — E870 Hyperosmolality and hypernatremia: Secondary | ICD-10-CM | POA: Diagnosis present

## 2021-11-18 DIAGNOSIS — I129 Hypertensive chronic kidney disease with stage 1 through stage 4 chronic kidney disease, or unspecified chronic kidney disease: Secondary | ICD-10-CM | POA: Diagnosis present

## 2021-11-18 DIAGNOSIS — N19 Unspecified kidney failure: Secondary | ICD-10-CM | POA: Diagnosis not present

## 2021-11-18 DIAGNOSIS — D649 Anemia, unspecified: Secondary | ICD-10-CM | POA: Diagnosis present

## 2021-11-18 DIAGNOSIS — E861 Hypovolemia: Secondary | ICD-10-CM | POA: Diagnosis present

## 2021-11-18 DIAGNOSIS — F17210 Nicotine dependence, cigarettes, uncomplicated: Secondary | ICD-10-CM | POA: Diagnosis present

## 2021-11-18 DIAGNOSIS — G9340 Encephalopathy, unspecified: Secondary | ICD-10-CM | POA: Diagnosis present

## 2021-11-18 DIAGNOSIS — G9341 Metabolic encephalopathy: Secondary | ICD-10-CM | POA: Diagnosis not present

## 2021-11-18 DIAGNOSIS — K219 Gastro-esophageal reflux disease without esophagitis: Secondary | ICD-10-CM | POA: Diagnosis not present

## 2021-11-18 DIAGNOSIS — Z8701 Personal history of pneumonia (recurrent): Secondary | ICD-10-CM

## 2021-11-18 DIAGNOSIS — K7460 Unspecified cirrhosis of liver: Secondary | ICD-10-CM | POA: Diagnosis present

## 2021-11-18 DIAGNOSIS — F319 Bipolar disorder, unspecified: Secondary | ICD-10-CM | POA: Diagnosis present

## 2021-11-18 DIAGNOSIS — R64 Cachexia: Secondary | ICD-10-CM | POA: Diagnosis present

## 2021-11-18 DIAGNOSIS — T17908A Unspecified foreign body in respiratory tract, part unspecified causing other injury, initial encounter: Secondary | ICD-10-CM

## 2021-11-18 DIAGNOSIS — Z5329 Procedure and treatment not carried out because of patient's decision for other reasons: Secondary | ICD-10-CM | POA: Diagnosis not present

## 2021-11-18 DIAGNOSIS — E860 Dehydration: Secondary | ICD-10-CM | POA: Diagnosis present

## 2021-11-18 DIAGNOSIS — R54 Age-related physical debility: Secondary | ICD-10-CM | POA: Diagnosis present

## 2021-11-18 DIAGNOSIS — L080 Pyoderma: Secondary | ICD-10-CM | POA: Diagnosis present

## 2021-11-18 DIAGNOSIS — F1910 Other psychoactive substance abuse, uncomplicated: Secondary | ICD-10-CM | POA: Diagnosis present

## 2021-11-18 DIAGNOSIS — E43 Unspecified severe protein-calorie malnutrition: Secondary | ICD-10-CM | POA: Diagnosis present

## 2021-11-18 DIAGNOSIS — R4182 Altered mental status, unspecified: Secondary | ICD-10-CM

## 2021-11-18 DIAGNOSIS — F411 Generalized anxiety disorder: Secondary | ICD-10-CM | POA: Diagnosis present

## 2021-11-18 DIAGNOSIS — Z79899 Other long term (current) drug therapy: Secondary | ICD-10-CM

## 2021-11-18 DIAGNOSIS — Z85048 Personal history of other malignant neoplasm of rectum, rectosigmoid junction, and anus: Secondary | ICD-10-CM

## 2021-11-18 DIAGNOSIS — F14129 Cocaine abuse with intoxication, unspecified: Secondary | ICD-10-CM | POA: Diagnosis present

## 2021-11-18 LAB — COMPREHENSIVE METABOLIC PANEL
ALT: 19 U/L (ref 0–44)
AST: 26 U/L (ref 15–41)
Albumin: 3 g/dL — ABNORMAL LOW (ref 3.5–5.0)
Alkaline Phosphatase: 58 U/L (ref 38–126)
Anion gap: 10 (ref 5–15)
BUN: 51 mg/dL — ABNORMAL HIGH (ref 6–20)
CO2: 20 mmol/L — ABNORMAL LOW (ref 22–32)
Calcium: 9.4 mg/dL (ref 8.9–10.3)
Chloride: 117 mmol/L — ABNORMAL HIGH (ref 98–111)
Creatinine, Ser: 1.85 mg/dL — ABNORMAL HIGH (ref 0.44–1.00)
GFR, Estimated: 32 mL/min — ABNORMAL LOW (ref >60)
Glucose, Bld: 72 mg/dL (ref 70–99)
Potassium: 4.7 mmol/L (ref 3.5–5.1)
Sodium: 147 mmol/L — ABNORMAL HIGH (ref 135–145)
Total Bilirubin: 0.3 mg/dL (ref 0.3–1.2)
Total Protein: 7.3 g/dL (ref 6.5–8.1)

## 2021-11-18 LAB — CBC WITH DIFFERENTIAL/PLATELET
Abs Immature Granulocytes: 0.06 10*3/uL (ref 0.00–0.07)
Basophils Absolute: 0 10*3/uL (ref 0.0–0.1)
Basophils Relative: 0 %
Eosinophils Absolute: 0.2 10*3/uL (ref 0.0–0.5)
Eosinophils Relative: 5 %
HCT: 31.4 % — ABNORMAL LOW (ref 36.0–46.0)
Hemoglobin: 9.9 g/dL — ABNORMAL LOW (ref 12.0–15.0)
Immature Granulocytes: 1 %
Lymphocytes Relative: 21 %
Lymphs Abs: 1.1 10*3/uL (ref 0.7–4.0)
MCH: 28.6 pg (ref 26.0–34.0)
MCHC: 31.5 g/dL (ref 30.0–36.0)
MCV: 90.8 fL (ref 80.0–100.0)
Monocytes Absolute: 0.7 10*3/uL (ref 0.1–1.0)
Monocytes Relative: 14 %
Neutro Abs: 2.9 10*3/uL (ref 1.7–7.7)
Neutrophils Relative %: 59 %
Platelets: 289 10*3/uL (ref 150–400)
RBC: 3.46 MIL/uL — ABNORMAL LOW (ref 3.87–5.11)
RDW: 17 % — ABNORMAL HIGH (ref 11.5–15.5)
WBC: 5 10*3/uL (ref 4.0–10.5)
nRBC: 0 % (ref 0.0–0.2)

## 2021-11-18 LAB — RESP PANEL BY RT-PCR (FLU A&B, COVID) ARPGX2
Influenza A by PCR: NEGATIVE
Influenza B by PCR: NEGATIVE
SARS Coronavirus 2 by RT PCR: NEGATIVE

## 2021-11-18 LAB — PROTIME-INR
INR: 1 (ref 0.8–1.2)
Prothrombin Time: 13.6 seconds (ref 11.4–15.2)

## 2021-11-18 LAB — ETHANOL: Alcohol, Ethyl (B): <10 mg/dL (ref <10)

## 2021-11-18 LAB — AMMONIA: Ammonia: 20 umol/L (ref 9–35)

## 2021-11-18 MED ORDER — LORAZEPAM 2 MG/ML IJ SOLN
2.0000 mg | Freq: Once | INTRAMUSCULAR | Status: AC
  Administered 2021-11-18: 2 mg via INTRAMUSCULAR
  Filled 2021-11-18: qty 1

## 2021-11-18 NOTE — ED Notes (Signed)
RN entered room while patient was yelling. Patient was informed where she was and why and agreed to cooperate and stay in bed.

## 2021-11-18 NOTE — ED Triage Notes (Signed)
Pt BIB [facility] with AMS. Per family pt was d/c from hospital 2 days ago for same. EMS states that family reports patient has been using crack while at home. PT was erratic and trying to hit the medics en route. Pt was given 5mg  haldol and 5 mg versed.

## 2021-11-18 NOTE — ED Provider Notes (Signed)
[facility] EMERGENCY DEPARTMENT Provider Note   CSN: [id number] Arrival date & time: 11/18/21  1800     History  Chief Complaint  Patient presents with   Altered Mental Status    Sherry Baker is a 56 y.o. female.   Altered Mental Status Patient brought in for altered mental status.  Discharged from the hospital couple days ago for similar symptoms.  At that time thought to be secondary to alcohol withdrawal.  Required benzos and eventually a Precedex drip.  Required intubation as per aspiration episode.  Reviewing hospital notes it appears as if skilled nursing was recommended but patient went home with home health instead.  Per EMS report reportedly went home and has been using crack.  Erratic for EMS and was given 5 of Haldol and 5 of Versed.  Reportedly was attempting to hit the medics.  Patient is unable to provide history at this time.    Home Medications Prior to Admission medications   Medication Sig Start Date End Date Taking? Authorizing Provider  acetaminophen (TYLENOL) 325 MG tablet Take 2 tablets (650 mg total) by mouth every 6 (six) hours as needed for mild pain, headache or fever. 09/30/21   Samella Parr, NP  amLODipine (NORVASC) 10 MG tablet Take 1 tablet (10 mg total) by mouth daily. 11/16/21   Shelly Coss, MD  busPIRone (BUSPAR) 15 MG tablet Take 1 tablet (15 mg total) by mouth 2 (two) times daily. 11/16/21   Shelly Coss, MD  carvedilol (COREG) 3.125 MG tablet Take 1 tablet (3.125 mg total) by mouth 2 (two) times daily with a meal. 11/16/21   Shelly Coss, MD  citalopram (CELEXA) 10 MG tablet Take 1 tablet (10 mg total) by mouth daily. 11/16/21   Shelly Coss, MD  diclofenac Sodium (VOLTAREN) 1 % GEL Apply 4 g topically 4 (four) times daily. 09/30/21   Samella Parr, NP  folic acid (FOLVITE) 1 MG tablet Take 1 tablet (1 mg total) by mouth daily. 11/17/21   Shelly Coss, MD  gabapentin (NEURONTIN) 100 MG capsule Take 1 capsule  (100 mg total) by mouth 3 (three) times daily. 11/16/21   Shelly Coss, MD  hydrOXYzine (ATARAX/VISTARIL) 10 MG tablet Take 1 tablet (10 mg total) by mouth 3 (three) times daily as needed. As needed for acute anxiety 11/15/20   Argentina Donovan, PA-C  lactulose (CHRONULAC) 10 GM/15ML solution Take 15 mLs (10 g total) by mouth 2 (two) times daily. 11/16/21   Shelly Coss, MD  Multiple Vitamin (MULTIVITAMIN WITH MINERALS) TABS tablet Place 1 tablet into feeding tube daily. Patient taking differently: Take 1 tablet by mouth daily. 04/09/21   Pokhrel, Corrie Mckusick, MD  nicotine (NICODERM CQ - DOSED IN MG/24 HOURS) 21 mg/24hr patch Place 1 patch (21 mg total) onto the skin daily. 11/17/21   Shelly Coss, MD  pantoprazole (PROTONIX) 40 MG tablet Take 1 tablet (40 mg total) by mouth 2 (two) times daily. 11/16/21 11/16/22  Shelly Coss, MD  thiamine 100 MG tablet Take 1 tablet (100 mg total) by mouth daily. 11/16/21   Shelly Coss, MD  traMADol (ULTRAM) 50 MG tablet Take 1 tablet (50 mg total) by mouth every 6 (six) hours as needed for moderate pain. 09/30/21   Samella Parr, NP  Vitamin D3 (VITAMIN D) 25 MCG tablet Take 1 tablet (1,000 Units total) by mouth daily. 09/30/21   Samella Parr, NP  ziprasidone (GEODON) 40 MG capsule Take 1 capsule (40 mg total) by  mouth 2 (two) times daily with a meal. 11/06/11 01/09/12  Daleen Bo, MD      Allergies    Patient has no known allergies.    Review of Systems   Review of Systems  Physical Exam Updated Vital Signs BP (!) 158/85    Pulse 77    Temp 97.9 F (36.6 C) (Axillary)    Resp 16    SpO2 98%  Physical Exam Vitals and nursing note reviewed.  HENT:     Head: Atraumatic.  Eyes:     General: No scleral icterus. Cardiovascular:     Rate and Rhythm: Regular rhythm.  Pulmonary:     Breath sounds: No wheezing.  Abdominal:     Tenderness: There is no abdominal tenderness.  Musculoskeletal:        General: No tenderness.     Cervical back:  Neck supple.  Skin:    General: Skin is warm.  Neurological:     Comments: Sedated in bed in ER.  Respond some to sternal rub but nonverbal.    ED Results / Procedures / Treatments   Labs (all labs ordered are listed, but only abnormal results are displayed) Labs Reviewed  CBC WITH DIFFERENTIAL/PLATELET - Abnormal; Notable for the following components:      Result Value   RBC 3.46 (*)    Hemoglobin 9.9 (*)    HCT 31.4 (*)    RDW 17.0 (*)    All other components within normal limits  RESP PANEL BY RT-PCR (FLU A&B, COVID) ARPGX2  ETHANOL  PROTIME-INR  COMPREHENSIVE METABOLIC PANEL  RAPID URINE DRUG SCREEN, HOSP PERFORMED  URINALYSIS, ROUTINE W REFLEX MICROSCOPIC  AMMONIA    EKG None  Radiology DG Chest 1 View  Result Date: 11/18/2021 CLINICAL DATA:  Altered mental status. EXAM: CHEST  1 VIEW COMPARISON:  November 03, 2021 FINDINGS: The heart size and mediastinal contours are within normal limits. Mild increased pulmonary interstitium is identified bilaterally. The visualized skeletal structures are unremarkable. IMPRESSION: Mild increased pulmonary interstitium is identified bilaterally. Mild pulmonary edema versus viral infection. Electronically Signed   By: Abelardo Diesel M.D.   On: 11/18/2021 19:03   CT HEAD WO CONTRAST (5MM)  Result Date: 11/18/2021 CLINICAL DATA:  Mental status changes.  Smoking crack at home. EXAM: CT HEAD WITHOUT CONTRAST TECHNIQUE: Contiguous axial images were obtained from the base of the skull through the vertex without intravenous contrast. RADIATION DOSE REDUCTION: This exam was performed according to the departmental dose-optimization program which includes automated exposure control, adjustment of the mA and/or kV according to patient size and/or use of iterative reconstruction technique. COMPARISON:  10/25/2021 FINDINGS: Brain: There is central and cortical atrophy. There is no intra or extra-axial fluid collection or mass lesion. The basilar cisterns  and ventricles have a normal appearance. There is no CT evidence for acute infarction or hemorrhage. Vascular: There is dense atherosclerotic calcification of the internal carotid arteries. No hyperdense vessels. Skull: Normal. Negative for fracture or focal lesion. Sinuses/Orbits: LEFT mastoid effusion, stable. Minimal opacification of the paranasal sinuses. Other: None. IMPRESSION: 1.  No evidence for acute intracranial abnormality. 2. Central and cortical atrophy. 3. LEFT mastoid effusion. Electronically Signed   By: Nolon Nations M.D.   On: 11/18/2021 18:55    Procedures Procedures    Medications Ordered in ED Medications  LORazepam (ATIVAN) injection 2 mg (2 mg Intramuscular Given 11/18/21 2154)    ED Course/ Medical Decision Making/ A&P  Medical Decision Making Problems Addressed: Altered mental status, unspecified altered mental status type: acute illness or injury that poses a threat to life or bodily functions  Amount and/or Complexity of Data Reviewed Independent Historian: EMS External Data Reviewed: notes. Labs: ordered. Decision-making details documented in ED Course. Radiology: independent interpretation performed. Decision-making details documented in ED Course. ECG/medicine tests: independent interpretation performed. Decision-making details documented in ED Course.  Risk Decision regarding hospitalization.  Critical Care Total time providing critical care: 30-74 minutes  Patient brought in for mental status change.  Reportedly per family has been doing cocaine.  Recent admission to same.  Discharged couple days ago.  Has been unresponsive and also later belligerent and yelling.  Had been given Haldol and Versed by EMS.  5 mg of each.  Initial work-up included head CT and chest x-ray.  Both reviewed and interpreted by myself.  Both reassuring although potentially some volume overload on the chest x-ray.  Hemoglobin showed a mild worsening  anemia.  Alcohol normal.  Has had mental status changes alcohol withdrawal in the past.  Has been given more Ativan here to help facilitate work-up. Up requiring admission to hospital and less mental status improved significantly.  Care turned over to Dr.Rees        Final Clinical Impression(s) / ED Diagnoses Final diagnoses:  Altered mental status, unspecified altered mental status type    Rx / DC Orders ED Discharge Orders     None         Davonna Belling, MD 11/18/21 2307

## 2021-11-18 NOTE — ED Notes (Signed)
Patient transported to CT 

## 2021-11-18 NOTE — ED Notes (Signed)
Staff entered room and observed patient yelling and rolling in bed. Staff attempted to reorient patient. Patient asked several times where she was and what happened. Patient was combative and unable to be oriented. Staff observed patient to be wearing saturated brief, patient unable to verify when she last changed her brief. Staff changed bed linens and applied clean brief to patient. No skin breakdown noted at this time. Patient remains in bed at this time.

## 2021-11-19 ENCOUNTER — Encounter (HOSPITAL_COMMUNITY): Payer: Self-pay | Admitting: Internal Medicine

## 2021-11-19 DIAGNOSIS — K219 Gastro-esophageal reflux disease without esophagitis: Secondary | ICD-10-CM | POA: Diagnosis present

## 2021-11-19 DIAGNOSIS — G9341 Metabolic encephalopathy: Secondary | ICD-10-CM | POA: Diagnosis present

## 2021-11-19 DIAGNOSIS — E870 Hyperosmolality and hypernatremia: Secondary | ICD-10-CM | POA: Diagnosis present

## 2021-11-19 DIAGNOSIS — N179 Acute kidney failure, unspecified: Secondary | ICD-10-CM | POA: Diagnosis present

## 2021-11-19 DIAGNOSIS — F14129 Cocaine abuse with intoxication, unspecified: Secondary | ICD-10-CM | POA: Diagnosis present

## 2021-11-19 DIAGNOSIS — F319 Bipolar disorder, unspecified: Secondary | ICD-10-CM | POA: Diagnosis present

## 2021-11-19 DIAGNOSIS — I10 Essential (primary) hypertension: Secondary | ICD-10-CM

## 2021-11-19 DIAGNOSIS — Z781 Physical restraint status: Secondary | ICD-10-CM | POA: Diagnosis not present

## 2021-11-19 DIAGNOSIS — I129 Hypertensive chronic kidney disease with stage 1 through stage 4 chronic kidney disease, or unspecified chronic kidney disease: Secondary | ICD-10-CM | POA: Diagnosis present

## 2021-11-19 DIAGNOSIS — F10931 Alcohol use, unspecified with withdrawal delirium: Secondary | ICD-10-CM | POA: Diagnosis not present

## 2021-11-19 DIAGNOSIS — E860 Dehydration: Secondary | ICD-10-CM | POA: Diagnosis present

## 2021-11-19 DIAGNOSIS — N19 Unspecified kidney failure: Secondary | ICD-10-CM

## 2021-11-19 DIAGNOSIS — E861 Hypovolemia: Secondary | ICD-10-CM | POA: Diagnosis present

## 2021-11-19 DIAGNOSIS — L080 Pyoderma: Secondary | ICD-10-CM | POA: Diagnosis present

## 2021-11-19 DIAGNOSIS — E872 Acidosis, unspecified: Secondary | ICD-10-CM | POA: Diagnosis present

## 2021-11-19 DIAGNOSIS — R9431 Abnormal electrocardiogram [ECG] [EKG]: Secondary | ICD-10-CM | POA: Diagnosis present

## 2021-11-19 DIAGNOSIS — D649 Anemia, unspecified: Secondary | ICD-10-CM | POA: Diagnosis present

## 2021-11-19 DIAGNOSIS — K7460 Unspecified cirrhosis of liver: Secondary | ICD-10-CM | POA: Diagnosis present

## 2021-11-19 DIAGNOSIS — E871 Hypo-osmolality and hyponatremia: Secondary | ICD-10-CM | POA: Diagnosis present

## 2021-11-19 DIAGNOSIS — Z20822 Contact with and (suspected) exposure to covid-19: Secondary | ICD-10-CM | POA: Diagnosis present

## 2021-11-19 DIAGNOSIS — E43 Unspecified severe protein-calorie malnutrition: Secondary | ICD-10-CM | POA: Diagnosis present

## 2021-11-19 DIAGNOSIS — R54 Age-related physical debility: Secondary | ICD-10-CM | POA: Diagnosis present

## 2021-11-19 DIAGNOSIS — R64 Cachexia: Secondary | ICD-10-CM | POA: Diagnosis present

## 2021-11-19 DIAGNOSIS — F17210 Nicotine dependence, cigarettes, uncomplicated: Secondary | ICD-10-CM | POA: Diagnosis present

## 2021-11-19 DIAGNOSIS — F1910 Other psychoactive substance abuse, uncomplicated: Secondary | ICD-10-CM | POA: Diagnosis present

## 2021-11-19 DIAGNOSIS — F1010 Alcohol abuse, uncomplicated: Secondary | ICD-10-CM | POA: Diagnosis present

## 2021-11-19 DIAGNOSIS — N1832 Chronic kidney disease, stage 3b: Secondary | ICD-10-CM | POA: Diagnosis present

## 2021-11-19 DIAGNOSIS — F411 Generalized anxiety disorder: Secondary | ICD-10-CM | POA: Diagnosis present

## 2021-11-19 LAB — BLOOD GAS, VENOUS
Acid-base deficit: 7.7 mmol/L — ABNORMAL HIGH (ref 0.0–2.0)
Bicarbonate: 17.2 mmol/L — ABNORMAL LOW (ref 20.0–28.0)
Drawn by: 6547
O2 Saturation: 71.3 %
Patient temperature: 36.5
pCO2, Ven: 33.3 mmHg — ABNORMAL LOW (ref 44.0–60.0)
pH, Ven: 7.33 (ref 7.250–7.430)
pO2, Ven: 41.2 mmHg (ref 32.0–45.0)

## 2021-11-19 LAB — PROTEIN / CREATININE RATIO, URINE
Creatinine, Urine: 50.43 mg/dL
Protein Creatinine Ratio: 1.17 mg/mg{Cre} — ABNORMAL HIGH (ref 0.00–0.15)
Total Protein, Urine: 59 mg/dL

## 2021-11-19 LAB — CBC WITH DIFFERENTIAL/PLATELET
Abs Immature Granulocytes: 0.05 10*3/uL (ref 0.00–0.07)
Basophils Absolute: 0 10*3/uL (ref 0.0–0.1)
Basophils Relative: 0 %
Eosinophils Absolute: 0.2 10*3/uL (ref 0.0–0.5)
Eosinophils Relative: 5 %
HCT: 41.3 % (ref 36.0–46.0)
Hemoglobin: 12.4 g/dL (ref 12.0–15.0)
Immature Granulocytes: 2 %
Lymphocytes Relative: 23 %
Lymphs Abs: 0.7 10*3/uL (ref 0.7–4.0)
MCH: 28.3 pg (ref 26.0–34.0)
MCHC: 30 g/dL (ref 30.0–36.0)
MCV: 94.3 fL (ref 80.0–100.0)
Monocytes Absolute: 0.5 10*3/uL (ref 0.1–1.0)
Monocytes Relative: 15 %
Neutro Abs: 1.8 10*3/uL (ref 1.7–7.7)
Neutrophils Relative %: 55 %
Platelets: 257 10*3/uL (ref 150–400)
RBC: 4.38 MIL/uL (ref 3.87–5.11)
RDW: 17.2 % — ABNORMAL HIGH (ref 11.5–15.5)
WBC: 3.2 10*3/uL — ABNORMAL LOW (ref 4.0–10.5)
nRBC: 0 % (ref 0.0–0.2)

## 2021-11-19 LAB — I-STAT VENOUS BLOOD GAS, ED
Acid-base deficit: 5 mmol/L — ABNORMAL HIGH (ref 0.0–2.0)
Bicarbonate: 19.9 mmol/L — ABNORMAL LOW (ref 20.0–28.0)
Calcium, Ion: 1.25 mmol/L (ref 1.15–1.40)
HCT: 35 % — ABNORMAL LOW (ref 36.0–46.0)
Hemoglobin: 11.9 g/dL — ABNORMAL LOW (ref 12.0–15.0)
O2 Saturation: 66 %
Potassium: 3.9 mmol/L (ref 3.5–5.1)
Sodium: 149 mmol/L — ABNORMAL HIGH (ref 135–145)
TCO2: 21 mmol/L — ABNORMAL LOW (ref 22–32)
pCO2, Ven: 35.3 mmHg — ABNORMAL LOW (ref 44.0–60.0)
pH, Ven: 7.359 (ref 7.250–7.430)
pO2, Ven: 35 mmHg (ref 32.0–45.0)

## 2021-11-19 LAB — CREATININE, URINE, RANDOM: Creatinine, Urine: 50.38 mg/dL

## 2021-11-19 LAB — URINALYSIS, ROUTINE W REFLEX MICROSCOPIC
Bilirubin Urine: NEGATIVE
Glucose, UA: NEGATIVE mg/dL
Hgb urine dipstick: NEGATIVE
Ketones, ur: NEGATIVE mg/dL
Leukocytes,Ua: NEGATIVE
Nitrite: NEGATIVE
Protein, ur: 30 mg/dL — AB
Specific Gravity, Urine: 1.025 (ref 1.005–1.030)
pH: 5.5 (ref 5.0–8.0)

## 2021-11-19 LAB — URINALYSIS, MICROSCOPIC (REFLEX): Bacteria, UA: NONE SEEN

## 2021-11-19 LAB — COMPREHENSIVE METABOLIC PANEL
ALT: 21 U/L (ref 0–44)
AST: 31 U/L (ref 15–41)
Albumin: 3 g/dL — ABNORMAL LOW (ref 3.5–5.0)
Alkaline Phosphatase: 66 U/L (ref 38–126)
Anion gap: 14 (ref 5–15)
BUN: 47 mg/dL — ABNORMAL HIGH (ref 6–20)
CO2: 16 mmol/L — ABNORMAL LOW (ref 22–32)
Calcium: 9.6 mg/dL (ref 8.9–10.3)
Chloride: 116 mmol/L — ABNORMAL HIGH (ref 98–111)
Creatinine, Ser: 1.63 mg/dL — ABNORMAL HIGH (ref 0.44–1.00)
GFR, Estimated: 37 mL/min — ABNORMAL LOW (ref >60)
Glucose, Bld: 73 mg/dL (ref 70–99)
Potassium: 3.8 mmol/L (ref 3.5–5.1)
Sodium: 146 mmol/L — ABNORMAL HIGH (ref 135–145)
Total Bilirubin: 0.4 mg/dL (ref 0.3–1.2)
Total Protein: 7.6 g/dL (ref 6.5–8.1)

## 2021-11-19 LAB — OSMOLALITY: Osmolality: 327 mOsm/kg (ref 275–295)

## 2021-11-19 LAB — BASIC METABOLIC PANEL
Anion gap: 14 (ref 5–15)
BUN: 47 mg/dL — ABNORMAL HIGH (ref 6–20)
CO2: 18 mmol/L — ABNORMAL LOW (ref 22–32)
Calcium: 9.5 mg/dL (ref 8.9–10.3)
Chloride: 117 mmol/L — ABNORMAL HIGH (ref 98–111)
Creatinine, Ser: 1.87 mg/dL — ABNORMAL HIGH (ref 0.44–1.00)
GFR, Estimated: 32 mL/min — ABNORMAL LOW (ref >60)
Glucose, Bld: 78 mg/dL (ref 70–99)
Potassium: 4.1 mmol/L (ref 3.5–5.1)
Sodium: 149 mmol/L — ABNORMAL HIGH (ref 135–145)

## 2021-11-19 LAB — GLUCOSE, CAPILLARY
Glucose-Capillary: 73 mg/dL (ref 70–99)
Glucose-Capillary: 83 mg/dL (ref 70–99)
Glucose-Capillary: 90 mg/dL (ref 70–99)
Glucose-Capillary: 84 mg/dL (ref 70–99)

## 2021-11-19 LAB — VITAMIN B12: Vitamin B-12: 525 pg/mL (ref 180–914)

## 2021-11-19 LAB — RAPID URINE DRUG SCREEN, HOSP PERFORMED
Amphetamines: NOT DETECTED
Barbiturates: POSITIVE — AB
Benzodiazepines: POSITIVE — AB
Cocaine: POSITIVE — AB
Opiates: NOT DETECTED
Tetrahydrocannabinol: NOT DETECTED

## 2021-11-19 LAB — CK: Total CK: 325 U/L — ABNORMAL HIGH (ref 38–234)

## 2021-11-19 LAB — LITHIUM LEVEL: Lithium Lvl: <0.06 mmol/L — ABNORMAL LOW (ref 0.60–1.20)

## 2021-11-19 LAB — PROTIME-INR
INR: 1 (ref 0.8–1.2)
Prothrombin Time: 13.5 seconds (ref 11.4–15.2)

## 2021-11-19 LAB — LACTIC ACID, PLASMA: Lactic Acid, Venous: 1.5 mmol/L (ref 0.5–1.9)

## 2021-11-19 LAB — OSMOLALITY, URINE: Osmolality, Ur: 498 mOsm/kg (ref 300–900)

## 2021-11-19 LAB — SODIUM, URINE, RANDOM: Sodium, Ur: 109 mmol/L

## 2021-11-19 LAB — TSH: TSH: 3.777 u[IU]/mL (ref 0.350–4.500)

## 2021-11-19 LAB — MAGNESIUM: Magnesium: 2.2 mg/dL (ref 1.7–2.4)

## 2021-11-19 MED ORDER — LORAZEPAM 2 MG/ML IJ SOLN: 1.0000 mg | Freq: Four times a day (QID) | INTRAMUSCULAR | Status: DC | PRN

## 2021-11-19 MED ORDER — ORAL CARE MOUTH RINSE
15.0000 mL | Freq: Two times a day (BID) | OROMUCOSAL | Status: DC
  Administered 2021-11-20 – 2021-11-23 (×7): 15 mL via OROMUCOSAL

## 2021-11-19 MED ORDER — FOLIC ACID 5 MG/ML IJ SOLN
1.0000 mg | Freq: Every day | INTRAMUSCULAR | Status: DC
  Administered 2021-11-19 – 2021-11-22 (×4): 1 mg via INTRAVENOUS
  Filled 2021-11-19 (×6): qty 0.2

## 2021-11-19 MED ORDER — SODIUM CHLORIDE 0.9 % IV BOLUS
1000.0000 mL | Freq: Once | INTRAVENOUS | Status: AC
  Administered 2021-11-19: 1000 mL via INTRAVENOUS

## 2021-11-19 MED ORDER — CHLORHEXIDINE GLUCONATE 0.12 % MT SOLN
15.0000 mL | Freq: Two times a day (BID) | OROMUCOSAL | Status: DC
  Administered 2021-11-20 – 2021-11-23 (×7): 15 mL via OROMUCOSAL
  Filled 2021-11-19 (×7): qty 15

## 2021-11-19 MED ORDER — NICOTINE 14 MG/24HR TD PT24: 14.0000 mg | MEDICATED_PATCH | Freq: Every day | TRANSDERMAL | Status: DC | PRN

## 2021-11-19 MED ORDER — HYDRALAZINE HCL 20 MG/ML IJ SOLN
5.0000 mg | INTRAMUSCULAR | Status: AC | PRN
  Administered 2021-11-19 – 2021-11-21 (×4): 5 mg via INTRAVENOUS
  Filled 2021-11-19 (×4): qty 1

## 2021-11-19 MED ORDER — HALOPERIDOL LACTATE 5 MG/ML IJ SOLN: 2.0000 mg | Freq: Four times a day (QID) | INTRAMUSCULAR | Status: DC | PRN

## 2021-11-19 MED ORDER — CHLORHEXIDINE GLUCONATE CLOTH 2 % EX PADS
6.0000 | MEDICATED_PAD | Freq: Every day | CUTANEOUS | Status: DC
  Administered 2021-11-19 – 2021-11-22 (×4): 6 via TOPICAL

## 2021-11-19 MED ORDER — MELATONIN 3 MG PO TABS
3.0000 mg | ORAL_TABLET | Freq: Every day | ORAL | Status: DC
  Administered 2021-11-20 – 2021-11-22 (×3): 3 mg via ORAL
  Filled 2021-11-19 (×3): qty 1

## 2021-11-19 MED ORDER — HALOPERIDOL LACTATE 5 MG/ML IJ SOLN
2.0000 mg | Freq: Four times a day (QID) | INTRAMUSCULAR | Status: DC | PRN
  Administered 2021-11-19: 2 mg via INTRAVENOUS
  Filled 2021-11-19: qty 1

## 2021-11-19 MED ORDER — LORAZEPAM 2 MG/ML IJ SOLN
1.0000 mg | Freq: Four times a day (QID) | INTRAMUSCULAR | Status: DC
  Administered 2021-11-19: 1 mg via INTRAVENOUS
  Filled 2021-11-19: qty 1

## 2021-11-19 MED ORDER — PHENOBARBITAL SODIUM 65 MG/ML IJ SOLN
65.0000 mg | Freq: Three times a day (TID) | INTRAMUSCULAR | Status: AC
  Administered 2021-11-19 – 2021-11-22 (×11): 65 mg via INTRAVENOUS
  Filled 2021-11-19 (×11): qty 1

## 2021-11-19 MED ORDER — LACTATED RINGERS IV SOLN: INTRAVENOUS | Status: DC

## 2021-11-19 MED ORDER — LORAZEPAM 2 MG/ML IJ SOLN
1.0000 mg | INTRAMUSCULAR | Status: DC | PRN
  Administered 2021-11-19 (×2): 1 mg via INTRAVENOUS
  Filled 2021-11-19 (×3): qty 1

## 2021-11-19 MED ORDER — THIAMINE HCL 100 MG/ML IJ SOLN
500.0000 mg | Freq: Three times a day (TID) | INTRAVENOUS | Status: AC
  Administered 2021-11-19 – 2021-11-21 (×6): 500 mg via INTRAVENOUS
  Filled 2021-11-19 (×3): qty 5
  Filled 2021-11-19: qty 4
  Filled 2021-11-19 (×3): qty 5

## 2021-11-19 MED ORDER — HEPARIN SODIUM (PORCINE) 5000 UNIT/ML IJ SOLN
5000.0000 [IU] | Freq: Three times a day (TID) | INTRAMUSCULAR | Status: DC
  Administered 2021-11-19 – 2021-11-23 (×13): 5000 [IU] via SUBCUTANEOUS
  Filled 2021-11-19 (×14): qty 1

## 2021-11-19 MED ORDER — THIAMINE HCL 100 MG/ML IJ SOLN
250.0000 mg | Freq: Every day | INTRAVENOUS | Status: DC
  Administered 2021-11-21 – 2021-11-23 (×3): 250 mg via INTRAVENOUS
  Filled 2021-11-19 (×3): qty 2.5

## 2021-11-19 MED ORDER — ACETAMINOPHEN 325 MG PO TABS
650.0000 mg | ORAL_TABLET | Freq: Four times a day (QID) | ORAL | Status: DC | PRN
  Administered 2021-11-20: 650 mg via ORAL
  Filled 2021-11-19 (×2): qty 2

## 2021-11-19 MED ORDER — PHENOBARBITAL 32.4 MG PO TABS: 32.4000 mg | ORAL_TABLET | Freq: Three times a day (TID) | ORAL | Status: DC

## 2021-11-19 MED ORDER — LORAZEPAM 2 MG/ML IJ SOLN
0.5000 mg | Freq: Four times a day (QID) | INTRAMUSCULAR | Status: DC | PRN
  Administered 2021-11-19 – 2021-11-23 (×4): 0.5 mg via INTRAVENOUS
  Filled 2021-11-19 (×4): qty 1

## 2021-11-19 MED ORDER — ACETAMINOPHEN 650 MG RE SUPP: 650.0000 mg | Freq: Four times a day (QID) | RECTAL | Status: DC | PRN

## 2021-11-19 MED ORDER — PANTOPRAZOLE SODIUM 40 MG IV SOLR
40.0000 mg | INTRAVENOUS | Status: DC
  Administered 2021-11-19: 40 mg via INTRAVENOUS
  Filled 2021-11-19: qty 40

## 2021-11-19 MED ORDER — THIAMINE HCL 100 MG/ML IJ SOLN: 100.0000 mg | Freq: Every day | INTRAMUSCULAR | Status: DC

## 2021-11-19 MED ORDER — HALOPERIDOL LACTATE 5 MG/ML IJ SOLN
0.5000 mg | Freq: Four times a day (QID) | INTRAMUSCULAR | Status: DC | PRN
Start: 2021-11-19 — End: 2021-11-19

## 2021-11-19 MED ORDER — DEXMEDETOMIDINE HCL IN NACL 400 MCG/100ML IV SOLN
0.4000 ug/kg/h | INTRAVENOUS | Status: DC
  Administered 2021-11-19: 1.2 ug/kg/h via INTRAVENOUS
  Administered 2021-11-19: 0.4 ug/kg/h via INTRAVENOUS
  Administered 2021-11-20: 1 ug/kg/h via INTRAVENOUS
  Administered 2021-11-20 – 2021-11-21 (×3): 1.2 ug/kg/h via INTRAVENOUS
  Filled 2021-11-19 (×7): qty 100

## 2021-11-19 MED ORDER — LORAZEPAM 2 MG/ML IJ SOLN
1.0000 mg | Freq: Once | INTRAMUSCULAR | Status: AC
  Administered 2021-11-19: 1 mg via INTRAVENOUS

## 2021-11-19 MED ORDER — THIAMINE HCL 100 MG/ML IJ SOLN
100.0000 mg | Freq: Every day | INTRAMUSCULAR | Status: DC
  Administered 2021-11-19: 100 mg via INTRAVENOUS
  Filled 2021-11-19: qty 2

## 2021-11-19 MED ORDER — THIAMINE HCL 100 MG/ML IJ SOLN
100.0000 mg | Freq: Every day | INTRAMUSCULAR | Status: DC
Start: 2021-11-19 — End: 2021-11-19

## 2021-11-19 MED ORDER — PHENOBARBITAL SODIUM 65 MG/ML IJ SOLN: 65.0000 mg | Freq: Three times a day (TID) | INTRAMUSCULAR | Status: DC

## 2021-11-19 NOTE — Progress Notes (Signed)
[place] Progress Note Patient Name: Sherry Baker DOB: [birth year] MRN: [id number]   Date of Service  11/19/2021  HPI/Events of Note  178/98 to 158/94  eICU Interventions  PRN iv Hydralazine ordered for SBP > 160 mmHg.        [name] 11/19/2021, 9:12 PM

## 2021-11-19 NOTE — Progress Notes (Signed)
Patient extremely combative and agitated. Pulling at all restraints. Maxed out on precidex. BP elevated due to extreme agitation. Contacted [facility] to see if haldol will can be ordered. Will continue to monitor at this time.

## 2021-11-19 NOTE — Consult Note (Addendum)
Neurology Consultation  Reason for Consult: encephalopathy. Referring Physician: Lorraine Baker., MD.   CC: AMS  History is obtained from: cousin at bedside, chart.   HPI: Sherry Baker is a 56 y.o. female with a PMHx of polysubstance abuse-Cocaine/crack, ETOH, Benzos, and PCP, as well as Bipolar disorder, rectal cancer s/p surgery with colonoscopy, cirrhosis, GAD, CKD IIIb, depression, and HTN. Sherry Baker recently spent a stint here in hospital for [place] and withdrawal 10/24/21 through 11/16/21. She was discharged to a short term rehab which she did not want to stay. She refused to stay and was discharged  home on 11/16/21.   Yesterday, she presented back here again with AMS, confusion, and agitation. She received Haldol 64m and Versed 51men route. Workup thus far has revealed hypernatremia, creatinine 1.85, ETOH < 10. UDS positive for cocaine and benzodiazepines which was likely the Versed given in the ambulance. CTH showed no acute finding. Evidence of dehydration and adult protein calorie malnutriton  Because of her agitation, patient was put in ICU on a Precedex drip which PCCM is managing.   Per cousin, patient has been in the hospital multiple times for this same reason. Patient always says she wants to be with her mother (who is expired). Patient always gets mad when she is "saved" when she is here. Patient lives in a crack house which is not sanitary or safe. Cousin states she always spends her money on drugs and ETOH. Cousin states patient has visual hallucinations-mom at bedside, baby in the bed with her, people having a birthday party for her. 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 Cousin says patient has been told she has 4 months to leave, but it not aware when her last hemoc visit was.   In review of chart, NP does not see any notes from hemoc on chart except for 01/18/20. Rectal cancer s/p ostomy is mentioned in a 09/06/21 note in hospital.   Per hemoc note 09/06/21:   Rectal cancer-distal rectal mass noted on colonoscopy 01/21/2017 with a biopsy confirming adenocarcinoma  CT abdomen/pelvis 01/17/2017 and CT chest 01/19/2017 with no evidence of distant metastatic disease MRI 02/18/2017-T3 N0, 6.5 cm from the anal verge Radiation/Xeloda initiated 02/17/2017; She did not receive radiation 03/04/2017 through 03/11/2017; received radiation 03/12/2017 and 03/13/2017; radiation then resumed 04/03/2017; she continued Xeloda, radiation completed 05/01/2017 APR 06/18/2018, ypT3,ypN0 tumor. MSI-stable, no loss of mismatch repair protein expression. Negative margins. Adjuvant chemotherapy not recommended due to nonhealing perineal wound   ROS: Unable to obtain due to altered mental status.   Past Medical History:  Diagnosis Date   Alcohol abuse    Allergy    PCNS swelling   Arthritis    Bipolar 1 disorder ([facility]   [place]   rectal cancer   Cancer ([facility]   Cirrhosis of liver ([facility]   Depression    Genetic testing 03/24/2017   Ms. OlBigosnderwent genetic counseling and testing for hereditary cancer syndromes on 02/17/2017. Her results were negative for mutations in all 46 genes analyzed by [facility]'s 46-gene Common Hereditary Cancers Panel. Genes analyzed include: APC, ATM, AXIN2, BARD1, BMPR1A, BRCA1, BRCA2, BRIP1, CDH1, CDKN2A, CHEK2, CTNNA1, DICER1, EPCAM, GREM1, HOXB13, KIT, MEN1, MLH1, MSH2, MSH3, MSH6, MUTYH, NBN,   Hypertension    Family History  Problem Relation Age of Onset   Cancer Mother [id number]     Originating in the abdomen, otherwise unknwon   Cancer - Other Maternal Aunt  Throat   Social History:   reports that she has been smoking cigarettes. She has a 19.50 pack-year smoking history. She has never used smokeless tobacco. She reports current alcohol use. She reports current drug use. Drugs: "Crack" cocaine and Cocaine.  Medications  Current Facility-Administered Medications:    acetaminophen (TYLENOL) tablet 650 mg, 650  mg, Oral, Q6H PRN **OR** acetaminophen (TYLENOL) suppository 650 mg, 650 mg, Rectal, Q6H PRN, Howerter, Justin B, DO   chlorhexidine (PERIDEX) 0.12 % solution 15 mL, 15 mL, Mouth Rinse, BID, Shearon Stalls, Senaida Ores, MD   Chlorhexidine Gluconate Cloth 2 % PADS 6 each, 6 each, Topical, Daily, Spero Geralds, MD, 6 each at 11/19/21 1224   dexmedetomidine (PRECEDEX) 400 MCG/100ML (4 mcg/mL) infusion, 0.4-1.2 mcg/kg/hr, Intravenous, Titrated, Sheikh, Omair Latif, DO, Last Rate: 13.2 mL/hr at 11/19/21 1200, 1.2 mcg/kg/hr at 01/74/94 4967   folic acid injection 1 mg, 1 mg, Intravenous, Daily, Desai, Rahul P, PA-C   haloperidol lactate (HALDOL) injection 2 mg, 2 mg, Intravenous, Q6H PRN **AND** LORazepam (ATIVAN) injection 1 mg, 1 mg, Intravenous, Q6H, Doda, Vandana, MD, 1 mg at 11/19/21 1223   heparin injection 5,000 Units, 5,000 Units, Subcutaneous, Q8H, Spero Geralds, MD   lactated ringers infusion, , Intravenous, Continuous, Desai, Rahul P, PA-C   MEDLINE mouth rinse, 15 mL, Mouth Rinse, q12n4p, Spero Geralds, MD   melatonin tablet 3 mg, 3 mg, Oral, QHS, Doda, Vandana, MD   nicotine (NICODERM CQ - dosed in mg/24 hours) patch 14 mg, 14 mg, Transdermal, Daily PRN, Howerter, Justin B, DO   pantoprazole (PROTONIX) injection 40 mg, 40 mg, Intravenous, Q24H, Howerter, Justin B, DO, 40 mg at 11/19/21 0918   PHENObarbital (LUMINAL) injection 65 mg, 65 mg, Intravenous, TID, Magdalen Spatz, NP   thiamine (B-1) injection 100 mg, 100 mg, Intravenous, Daily, Howerter, Justin B, DO, 100 mg at 11/19/21 0329  Exam: Current vital signs: BP (!) 147/84    Pulse 93    Temp 98 F (36.7 C) (Axillary)    Resp 19    SpO2 95%  Vital signs in last 24 hours: Temp:  [97.9 F (36.6 C)-98.8 F (37.1 C)] 98 F (36.7 C) (01/17 1200) Pulse Rate:  [25-126] 93 (01/17 1215) Resp:  [13-28] 19 (01/17 1215) BP: (133-183)/(74-105) 147/84 (01/17 1215) SpO2:  [70 %-100 %] 95 % (01/17 1215)  PE: GENERAL: Acute on chronically ill  appearing female lying in hospital bed. Thrashing, cursing.  On 2nd visit, she is quiet in bed in 4 point restraints.  HEENT: normocephalic and atraumatic. LUNGS - Normal respiratory effort.  CV - RRR on tele. ABDOMEN - Soft, nontender. Ext: warm, well perfused. Psych: affect agitated.   NEURO:  Mental Status: Does not answer orientation questions. She did say her name is Sherry Baker. Aroused by tactile stimulation.  Speech/Language: speech is without dysarthria or aphasia noted upon yelling.  Naming, repetition, fluency, and comprehension not intact.  Cranial Nerves:  Exam in face of thrashing and disorientation:  PERRL, 84m. Face is symmetrical at rest. Hearing intact to voice. She moves all 4 extremities spontaneously. Her strength with agitation is 5/5. Tone is normal. Bulk is normal.  Sensation- intact to sternal rub.   Labs I have reviewed labs in epic and the results pertinent to this consultation are:   CBC    Component Value Date/Time   WBC 3.2 (L) 11/19/2021 0336   RBC 4.38 11/19/2021 0336   HGB 12.4 11/19/2021 0336   HGB 12.6 11/15/2020 1153  HGB 9.7 (L) 10/07/2017 1335   HCT 41.3 11/19/2021 0336   HCT 37.6 11/15/2020 1153   HCT 31.5 (L) 10/07/2017 1335   PLT 257 11/19/2021 0336   PLT 167 11/15/2020 1153   MCV 94.3 11/19/2021 0336   MCV 92 11/15/2020 1153   MCV 74.2 (L) 10/07/2017 1335   MCH 28.3 11/19/2021 0336   MCHC 30.0 11/19/2021 0336   RDW 17.2 (H) 11/19/2021 0336   RDW 13.9 11/15/2020 1153   RDW 20.2 (H) 10/07/2017 1335   LYMPHSABS 0.7 11/19/2021 0336   LYMPHSABS 0.9 11/15/2020 1153   LYMPHSABS 2.2 10/07/2017 1335   MONOABS 0.5 11/19/2021 0336   MONOABS 1.0 (H) 10/07/2017 1335   EOSABS 0.2 11/19/2021 0336   EOSABS 0.1 11/15/2020 1153   BASOSABS 0.0 11/19/2021 0336   BASOSABS 0.0 11/15/2020 1153   BASOSABS 0.0 10/07/2017 1335    CMP     Component Value Date/Time   NA 146 (H) 11/19/2021 0336   NA 141 11/15/2020 1153   NA 133 (L) 04/22/2017 1512    K 3.8 11/19/2021 0336   K 3.9 04/22/2017 1512   CL 116 (H) 11/19/2021 0336   CO2 16 (L) 11/19/2021 0336   CO2 24 04/22/2017 1512   GLUCOSE 73 11/19/2021 0336   GLUCOSE 79 04/22/2017 1512   BUN 47 (H) 11/19/2021 0336   BUN 19 11/15/2020 1153   BUN 9.5 04/22/2017 1512   CREATININE 1.63 (H) 11/19/2021 0336   CREATININE 0.6 04/22/2017 1512   CALCIUM 9.6 11/19/2021 0336   CALCIUM 9.4 04/22/2017 1512   PROT 7.6 11/19/2021 0336   PROT 7.2 11/15/2020 1153   PROT 7.9 04/22/2017 1512   ALBUMIN 3.0 (L) 11/19/2021 0336   ALBUMIN 3.8 11/15/2020 1153   ALBUMIN 3.6 04/22/2017 1512   AST 31 11/19/2021 0336   AST 26 04/22/2017 1512   ALT 21 11/19/2021 0336   ALT 15 04/22/2017 1512   ALKPHOS 66 11/19/2021 0336   ALKPHOS 68 04/22/2017 1512   BILITOT 0.4 11/19/2021 0336   BILITOT <0.2 11/15/2020 1153   BILITOT 0.31 04/22/2017 1512   GFRNONAA 37 (L) 11/19/2021 0336   GFRAA 48 (L) 11/15/2020 1153   Imaging MD reviewed the images obtained.  CT Head 1.No evidence for acute intracranial abnormality. 2. Central and cortical atrophy. 3. LEFT mastoid effusion.  Assessment: 57 yo female with multiple stays for polysubstance abuse overdose, or withdrawal. She has hallucinations which is likely tied to her use of multiple illicit substances and possible Korsakoff's. Nothing on CT concerning. We have nothing much to offer given the likely cause of her encephalopathy are the things we already know.   Recommendations/Plan:  -Agree with psychiatry consult.  -Agree with Precedex drip.  -Restraints prn.  -Fall precautions.  -Watch for ETOH withdrawal seizures.  -Continue to research infectious/metabolic disorders adding to her encephalopathy.  -MVI qd.  -High dose Thiamine IV here, then 116m po qd on discharge.  -Case manager consult for discharge planning-safer place to live, access to medical care and medicines.   Neurology will be available prn for questions.   Pt seen by KClance Boll  NP/Neuro and later by MD. Note/plan to be edited by MD as needed.  Pager: [phone] NEUROHOSPITALIST ADDENDUM Performed a face to face diagnostic evaluation.   I have reviewed the contents of history and physical exam as documented by PA/ARNP/Resident and agree with above documentation.  I have discussed and formulated the above plan as documented. Edits to the note have been  made as needed.  Impression/Key exam findings/Plan: 70F with several admissions in the setting of substance use who we evaluated for agitated delirium. UDS positive for cocaine, benzos and Phenobarb. EtOH levels were negative. Substance use and alcohol withdrawal likely contributing to her agitated delirium. Her CTH is negative, she is not febrile. Agree with Precedex and combo of Phenobarb and ativan for withdrawal.  Cousin at bedside reports hallucinations and psychosis prior to this admission. Could be related to substance use but with her EtOh use, Korsakoff is a consideration. Will do high dose thiamine.   Donnetta Simpers, MD [facility] [phone]  Update: 11:05 AM 11/20/2021 I spoke with PCCM team. They are comfortable managing delirium at this time. We will signoff.    If 7pm to 7am, please call on call as listed on AMION.

## 2021-11-19 NOTE — ED Notes (Signed)
Pt is thrashing around in bed, yelling "mama" unable to be oriented for more than a brief moment of time. Pt pulling off brief, tossing and turning pulling the sheets off of the stretcher. IV secured w/ coban. Pt will not maintain cardiac monitoring, bp cuff, or pulse ox. Bed rails up for pt safety, bed locked in lowest position, pt close to nurse's station, call light within reach.

## 2021-11-19 NOTE — H&P (Signed)
NAME:  Sherry Baker, MRN:  [id number], DOB:  03-09-1966, LOS: 0 ADMISSION DATE:  11/18/2021, CONSULTATION DATE:  11/19/2021 REFERRING MD: [facility]/ [facility] , CHIEF COMPLAINT:   AMS requiring precedex gtt  History of Present Illness:  Sherry Baker is a 56 y.o. female current every day smoker with medical history significant for polysubstance abuse, including cocaine use as well as alcohol abuse, type I bipolar disorder, central hypertension, generalized anxiety disorder, stage IIIb chronic kidney disease with associated baseline creatinine 1.3-1.6, chronic anemia with baseline hemoglobin 10-12, who is admitted to [facility] on 11/18/2021 with acute metabolic encephalopathy after presenting from home to [facility] ED for evaluation of altered mental status.  Recent admission to [facility] from 10/24/2021 -11/16/2021 initially for altered mental status this felt to be on the basis of her polysubstance abuse, including ensuing development of alcohol withdrawal for which she was started on Precedex drip, ultimately aspirated, developed acute hypoxic respiratory failure, with transferred into the ICU.  Temporary SNF placement for temporary rehab in the context of generalized deconditioning given a 3-week course in the hospital was recommended to the patient.  However, the patient ultimately refused this recommendation and was subsequent discharged home on 11/16/2021.  Patient was brought to the ED via EMS 1/16 when family found her again with AMS , confused and agitated.  EMS administered  Haldol 5 mg IM x1, Versed 5 mg IV ED x1 in route to [facility] .   In the ED, Afebrile; heart rate 7778; blood pressure 133/74 - 158/85; respiratory rate 16, oxygen saturation 97 to 98% on room air.   Labs were notable for the following: CMP notable for the following: Sodium 147 compared to most recent prior value of 139 on 11/16/2021, chloride 117, bicarbonate 20, anion gap 10, creatinine 1.85 relative to most recent prior value  of 1.63 on 11/16/2021, BUN to creatinine ratio 27.6, glucose 72, calcium corrected for mild hypoalbuminemia 10.2, albumin 3.0, otherwise, liver enzymes are within normal months.  Ammonia 20.  CBC notable for will Patel, 5000, hemoglobin 9.9, associated normocytic/normochromic findings.  INR 1.0.  Serum ethanol less than 10.  Urinary drug screen positive for cocaine as well as benzodiazepines (within the context of having received Versed in route to the ED this evening); urinalysis notable for demonstrating no white blood cells, no bacteria, 30 protein, no hemoglobin, no RBCs, and specific gravity 1.025.  COVID-19/influenza PCR [place] negative.  She was admitted by [facility] for further evaluation management of presenting suspected acute metabolic encephalopathy complicated by AKI on stage IIIb CKD as well as dehydration associated with hypernatremia.  She has remained agitated since 9 pm 1/16. She has received multiple doses of Ativan and Haldol with no change in her agitation and mental status. [facility] have ordered precedex, therefore patient will be admitted to the ICU and  PCCM will manage care.   Pertinent  Medical History   Past Medical History:  Diagnosis Date   Alcohol abuse    Allergy    PCNS swelling   Arthritis    Bipolar 1 disorder ([place])    Cancer ([place]) 01/21/2017   rectal cancer   Cancer ([place])    Cirrhosis of liver ([place])    Depression    Genetic testing 03/24/2017   Ms. Vassel underwent genetic counseling and testing for hereditary cancer syndromes on 02/17/2017. Her results were negative for mutations in all 46 genes analyzed by [facility]'s 46-gene Common Hereditary Cancers Panel. Genes analyzed include: APC, ATM, AXIN2,  BARD1, BMPR1A, BRCA1, BRCA2, BRIP1, CDH1, CDKN2A, CHEK2, CTNNA1, DICER1, EPCAM, GREM1, HOXB13, KIT, MEN1, MLH1, MSH2, MSH3, MSH6, MUTYH, NBN,   Hypertension      Significant Hospital Events: Including procedures, antibiotic start and stop dates in addition to other  pertinent events   10/24/2021-11/16/2021 Admission  11/17/2021 Readmission after cocaine use   Interim History / Subjective:  Pt is agitated, in 4 point restraints, flailing in bed and screaming in comprehensively. She is not following commands . She is combative with staff, She is not making eye contact. She is hemodynamically stable, tachy with adequate BP. QTc is .465 ms after Haldol.   Mag 2.2 , CK 325, Na 146, K 3.8, Chloride 116, CO2 16, BUN 47, Creatinine 1.63, Albumin 3, GFR 37 ml/min WBC 3.2,  HGB 12.4, platelets 257K  Objective   Blood pressure (!) 167/92, pulse (!) 114, temperature 97.9 F (36.6 C), temperature source Axillary, resp. rate 18, SpO2 100 %.       No intake or output data in the 24 hours ending 11/19/21 1011 There were no vitals filed for this visit.  Examination: General: Agitated , in 4 point restraints, confused frail female  HENT: NCAT, No LAD , No JVD, , MM pink and dry, poor skin turgor Lungs:  Bilateral chest excursion, BS difficult to assess due to patient screaming constantly, on RA with sats of 98% Cardiovascular:  S1. S2, RRR, No RMG, Tachy per tele HR od 158-131 Abdomen:  Soft, Flat, non-distended, NT, BS +, ostomy bag Extremities:  No obvious deformities, no lesions , + bruising, IV dressings Neuro:  Alert to self only , does not follow commands, speaks incoherently GU:  Not assessed  Resolved Hospital Problem list     Assessment & Plan:  Acute encephalopathy  multifactorial in nature, with at least partial metabolic contributions in the setting of dehydration, resultant hypernatremia, acute kidney injury superimposed on stage IIIb chronic kidney disease, and polysubstance abuse ( cocaine and ETOH)  Plan Will start Precedex gtt and transfer to ICU CIWA protocol  QTc monitoring  Folic Acid and Thiamine as ordered Consider adding Librium 25 mg TID ( Lithium level pending)  Consider Clonidine Consider reduced dose phenobarbital 65 mg TID   Consider Seroquel BID  Aspiration Risk Dysphagia with Hx of recent aspiration pneumonia Plan  NPO for now  Swallow eval once able to follow commands May need to consider Cor Track and feeds  HOB elevated CXR in am and prn   Hypovolemic Hyponatremia Plan Gentle IVF Trend BMET daily CXR in am and prn to monitor for edema Urine studies pending ( creatinine / osmolality/ Na/protein and creatinine )  Dehydration Plan Strict I&O monitoring  Trend CMP LR at 100 cc/hr   AKI on CKD IIIB Baseline Creatinine 1.3-1.6 Presenting serum creatinine 1.85 Pre-renal in setting of dehydration ( Acute pre-renal azotemia , UA with elevated SG) Plan Trend CMP Strict I&O Maintain renal perfusion ( MAP Goal > 65 mm Hg) Avoid nephrotoxic agents Trend CPK  Essential HTN Plan OP med regimen>> Norvasc, Coreg Eval needs once patient is calm, as agitation contributing to elevated BP at present   Polysubstance abuse ETOH and cocaine Plan Will need OP treatment  CIWA and Precedex  Generalized anxiety Disorder Plan Hold home Celexa, BuSpar, Precedex , ativan as ordered Will need to add back once mental status clears   Best Practice (right click and "Reselect all SmartList Selections" daily)   Diet/type: NPO DVT prophylaxis: SCD GI prophylaxis: PPI Lines:  N/A Foley:  N/A Code Status:  full code Last date of multidisciplinary goals of care discussion [pending]  Labs   CBC: Recent Labs  Lab 11/13/21 0236 11/18/21 2235 11/19/21 0333 11/19/21 0336  WBC 5.8 5.0  --  3.2*  NEUTROABS  --  2.9  --  1.8  HGB 10.5* 9.9* 11.9* 12.4  HCT 32.2* 31.4* 35.0* 41.3  MCV 87.7 90.8  --  94.3  PLT 274 289  --  882    Basic Metabolic Panel: Recent Labs  Lab 11/13/21 0236 11/15/21 0030 11/16/21 0109 11/18/21 2235 11/19/21 0333 11/19/21 0336  NA 133* 138 139 147* 149* 146*  K 3.8 4.1 3.6 4.7 3.9 3.8  CL 101 102 106 117*  --  116*  CO2 _0 20*  --  16*  GLUCOSE 99 94 83 72  --   73  BUN 52* 64* 55* 51*  --  47*  CREATININE 1.43* 2.12* 1.63* 1.85*  --  1.63*  CALCIUM 9.4 9.7 9.0 9.4  --  9.6  MG  --   --   --   --   --  2.2   GFR: Estimated Creatinine Clearance: 27.4 mL/min (A) (by C-G formula based on SCr of 1.63 mg/dL (H)). Recent Labs  Lab 11/13/21 0236 11/18/21 2235 11/19/21 0336  WBC 5.8 5.0 3.2*    Liver Function Tests: Recent Labs  Lab 11/18/21 2235 11/19/21 0336  AST 26 31  ALT 19 21  ALKPHOS 58 66  BILITOT 0.3 0.4  PROT 7.3 7.6  ALBUMIN 3.0* 3.0*   No results for input(s): LIPASE, AMYLASE in the last 168 hours. Recent Labs  Lab 11/18/21 2237  AMMONIA 20    ABG    Component Value Date/Time   PHART 7.351 09/11/2021 0910   PCO2ART 53.7 (H) 09/11/2021 0910   PO2ART 104 09/11/2021 0910   HCO3 19.9 (L) 11/19/2021 0333   TCO2 21 (L) 11/19/2021 0333   ACIDBASEDEF 5.0 (H) 11/19/2021 0333   O2SAT 66.0 11/19/2021 0333     Coagulation Profile: Recent Labs  Lab 11/18/21 2235 11/19/21 0336  INR 1.0 1.0    Cardiac Enzymes: Recent Labs  Lab 11/19/21 0338  CKTOTAL 325*    HbA1C: Hgb A1c MFr Bld  Date/Time Value Ref Range Status  10/29/2021 07:27 AM 5.2 4.8 - 5.6 % Final    Comment:    (NOTE) Pre diabetes:          5.7%-6.4%  Diabetes:              >6.4%  Glycemic control for   <7.0% adults with diabetes     CBG: Recent Labs  Lab 11/14/21 0744 11/14/21 1208 11/14/21 1606 11/14/21 1917 11/15/21 2003  GLUCAP 77 102* 101* 109* 90    Review of Systems:   Unable as patient is combative and does not respond to questions in her agitated state  Past Medical History:  She,  has a past medical history of Alcohol abuse, Allergy, Arthritis, Bipolar 1 disorder ([place]), Cancer ([place]) (01/21/2017), Cancer ([place]), Cirrhosis of liver ([place]), Depression, Genetic testing (03/24/2017), and Hypertension.   Surgical History:   Past Surgical History:  Procedure Laterality Date   ABDOMINAL PERINEAL BOWEL RESECTION N/A 06/18/2017    Procedure: ABDOMINAL PERINEAL RESECTION ERAS PATHWAY;  Surgeon: Leighton Ruff, MD;  Location: [facility];  Service: General;  Laterality: N/A;   COLON SURGERY     COLONOSCOPY WITH PROPOFOL Left 01/21/2017   Procedure: COLONOSCOPY WITH PROPOFOL;  Surgeon: Otis Brace, MD;  Location: [facility];  Service: Gastroenterology;  Laterality: Left;   FRACTURE SURGERY     MANDIBLE FRACTURE SURGERY       Social History:   reports that she has been smoking cigarettes. She has a 19.50 pack-year smoking history. She has never used smokeless tobacco. She reports current alcohol use. She reports current drug use. Drugs: "Crack" cocaine and Cocaine.   Family History:  Her family history includes Cancer (age of onset: 60) in her mother; Cancer - Other in her maternal aunt.   Allergies No Known Allergies   Home Medications  Prior to Admission medications   Medication Sig Start Date End Date Taking? Authorizing Provider  acetaminophen (TYLENOL) 325 MG tablet Take 2 tablets (650 mg total) by mouth every 6 (six) hours as needed for mild pain, headache or fever. 09/30/21   Samella Parr, NP  amLODipine (NORVASC) 10 MG tablet Take 1 tablet (10 mg total) by mouth daily. 11/16/21   Shelly Coss, MD  busPIRone (BUSPAR) 15 MG tablet Take 1 tablet (15 mg total) by mouth 2 (two) times daily. 11/16/21   Shelly Coss, MD  carvedilol (COREG) 3.125 MG tablet Take 1 tablet (3.125 mg total) by mouth 2 (two) times daily with a meal. 11/16/21   Shelly Coss, MD  citalopram (CELEXA) 10 MG tablet Take 1 tablet (10 mg total) by mouth daily. 11/16/21   Shelly Coss, MD  diclofenac Sodium (VOLTAREN) 1 % GEL Apply 4 g topically 4 (four) times daily. 09/30/21   Samella Parr, NP  folic acid (FOLVITE) 1 MG tablet Take 1 tablet (1 mg total) by mouth daily. 11/17/21   Shelly Coss, MD  gabapentin (NEURONTIN) 100 MG capsule Take 1 capsule (100 mg total) by mouth 3 (three) times daily. 11/16/21   Shelly Coss, MD   hydrOXYzine (ATARAX/VISTARIL) 10 MG tablet Take 1 tablet (10 mg total) by mouth 3 (three) times daily as needed. As needed for acute anxiety 11/15/20   Argentina Donovan, PA-C  lactulose (CHRONULAC) 10 GM/15ML solution Take 15 mLs (10 g total) by mouth 2 (two) times daily. 11/16/21   Shelly Coss, MD  Multiple Vitamin (MULTIVITAMIN WITH MINERALS) TABS tablet Place 1 tablet into feeding tube daily. Patient taking differently: Take 1 tablet by mouth daily. 04/09/21   Pokhrel, Corrie Mckusick, MD  nicotine (NICODERM CQ - DOSED IN MG/24 HOURS) 21 mg/24hr patch Place 1 patch (21 mg total) onto the skin daily. 11/17/21   Shelly Coss, MD  pantoprazole (PROTONIX) 40 MG tablet Take 1 tablet (40 mg total) by mouth 2 (two) times daily. 11/16/21 11/16/22  Shelly Coss, MD  thiamine 100 MG tablet Take 1 tablet (100 mg total) by mouth daily. 11/16/21   Shelly Coss, MD  traMADol (ULTRAM) 50 MG tablet Take 1 tablet (50 mg total) by mouth every 6 (six) hours as needed for moderate pain. 09/30/21   Samella Parr, NP  Vitamin D3 (VITAMIN D) 25 MCG tablet Take 1 tablet (1,000 Units total) by mouth daily. 09/30/21   Samella Parr, NP  ziprasidone (GEODON) 40 MG capsule Take 1 capsule (40 mg total) by mouth 2 (two) times daily with a meal. 11/06/11 01/09/12  Daleen Bo, MD     Critical care time: 36 minutes    Magdalen Spatz, MSN, AGACNP-BC [place] for personal pager PCCM on call pager [phone]  11/19/2021 11:26 AM

## 2021-11-19 NOTE — ED Notes (Addendum)
This RN in to assess pt, pts legs were at the head of the bed. This RN and other RN repositioned pt, pt attempting to kick and hit staff. Pt reoriented. Seizure pads applied to bed rails as pt was seen putting limbs through railing attempting to crawl out of bed. Sheikh, DO paged regarding pts behavior.

## 2021-11-19 NOTE — ED Notes (Signed)
This RN at bedside to attempt to assess pt. Pt alert to self, able to state own name, otherwise saying words without making sense. Pt thrashing around in bed yelling at this time. Unable to obtain BP despite multiple attempts as pt will not remain still.

## 2021-11-19 NOTE — ED Notes (Signed)
Pt continues to yell and thrash in bed, yelling "mama" and "JJ come here". This RN attempted to reorient pt, despite efforts, pt continues to yell.

## 2021-11-19 NOTE — ED Notes (Signed)
Dr. Alfredia Ferguson at bedside for patient evaluation.

## 2021-11-19 NOTE — Consult Note (Addendum)
[facility] Consult   Reason for Consult: Agitation, polysubstance abuse, bipolar Referring Physician: Dr. Alfredia Ferguson  Patient Identification: Sherry Baker MRN:  [id number] Principal Diagnosis: Acute metabolic encephalopathy Diagnosis:  Principal Problem:   Acute metabolic encephalopathy Active Problems:   Hypertension   GERD (gastroesophageal reflux disease)   Polysubstance abuse (HCC)   Hypernatremia   AKI (acute kidney injury) ([place])   Dehydration   Acute prerenal azotemia   Total Time spent with patient: 20 minutes  HPI:  Sherry Baker is a 56 y.o. female history of polysubstance abuse, cocaine abuse, alcohol abuse, 23, HTN, GAD, stage III kidney disease, chronic anemia admitted with altered mental status.  Patient was recently admitted from 10/24/21-11/16/21 for altered mental status and was recommended for temporary SNF but patient refused and was sent to home on 11/16/21.  Psych consult placed on 11/19/2021 due to agitation, polysubstance abuse and bipolar 1 by Dr. Alfredia Ferguson.   Assessment: Patient current presentation is consistent with acute encephalopathy which can be multifactorial due to electrolyte derangements in the setting of dehydration, infection.  Unlikely due to alcohol withdrawal as patient was recently discharged from hospital and was detoxed with Librium.  On exam, patient is not oriented.  Patient's oxygen concentration in the 60s and 70s when patient was agitated which might be contributing to patient's AMS.  UDS positive for cocaine, benzo and barbiturates (benzo and barbiturate likely due to medications given in the hospital).  Patient received Haldol, Versed, and Ativan due to agitation.  Now on Precedex.  Recommend treating medical issues  and holding psych medications. Can give as needed Haldol for agitation which should be followed by Ativan to reduce the risk of EPS.   ## Safety and Observation Level:  -Can continue four-point restraints as patient is  not oriented and high risk for fall and can pull IV lines.   ## Medications:  -Recommend treating medical issues including electrolyte derangements.  -Hold Celexa, buspirone -Continue Haldol 2 mg as needed every 6 hours for agitation.  Add 1 mg Ativan each time Haldol is given to reduce the risk of EPS -Can give melatonin 3 mg nightly at 6 PM if patient can swallow.  -Continue folic acid and thiamine  ## Medical Decision Making Capacity:  Not formally assessed.    ## Further Work-up:    -- most recent EKG on 11/19/2021 shows QTC 465.  -- Pertinent labwork reviewed during this admission includes: CMP (high Na 146, 149, 147), bicarbonate 19.9, Mg (>2),WBCs 3.2, hemoglobin 11.9, CK elevated 325, urinalysis negative for WBC, bacteria, RBCs.  COVID/influenza negative   ## Disposition:  -- unclear at this time  Subjective: Seen patient today in her room.  Patient screaming and shouting "mommy, mommy".  Patient is responding to internal stimuli.  Patient is altered and not able to answer any orientation questions.  Patient's eyes are closed.  Patient is not able to follow any commands.  Not able to test muscles for rigidity as patient was uncooperative.  During evaluation, the monitor showed patient's oxygen consistently in 60s and 70s when Pt was agitated with mildly high BP and high PR.  Later in the evaluation patient fell asleep and started snoring and her O2 saturation increased to 100%.   Spoke to the Pts' nurse who stated Pt's O2 saturation had been in 90's all Am.   Past Psychiatric History: History of polysubstance abuse, cocaine abuse, alcohol abuse, bipolar 1, GAD  Risk to Self:   Risk to Others:  Prior Inpatient Therapy:   Prior Outpatient Therapy:    Past Medical History:  Past Medical History:  Diagnosis Date   Alcohol abuse    Allergy    PCNS swelling   Arthritis    Bipolar 1 disorder ([place])    Cancer ([place]) 01/21/2017   rectal cancer   Cancer ([place])    Cirrhosis of  liver ([place])    Depression    Genetic testing 03/24/2017   Ms. Kjos underwent genetic counseling and testing for hereditary cancer syndromes on 02/17/2017. Her results were negative for mutations in all 46 genes analyzed by [facility]'s 46-gene Common Hereditary Cancers Panel. Genes analyzed include: APC, ATM, AXIN2, BARD1, BMPR1A, BRCA1, BRCA2, BRIP1, CDH1, CDKN2A, CHEK2, CTNNA1, DICER1, EPCAM, GREM1, HOXB13, KIT, MEN1, MLH1, MSH2, MSH3, MSH6, MUTYH, NBN,   Hypertension     Past Surgical History:  Procedure Laterality Date   ABDOMINAL PERINEAL BOWEL RESECTION N/A 06/18/2017   Procedure: ABDOMINAL PERINEAL RESECTION ERAS PATHWAY;  Surgeon: Leighton Ruff, MD;  Location: [facility];  Service: General;  Laterality: N/A;   COLON SURGERY     COLONOSCOPY WITH PROPOFOL Left 01/21/2017   Procedure: COLONOSCOPY WITH PROPOFOL;  Surgeon: Otis Brace, MD;  Location: [facility];  Service: Gastroenterology;  Laterality: Left;   FRACTURE SURGERY     MANDIBLE FRACTURE SURGERY     Family History:  Family History  Problem Relation Age of Onset   Cancer Mother 82       Originating in the abdomen, otherwise unknwon   Cancer - Other Maternal Aunt        Throat   Family Psychiatric  History: None Social History:  Social History   Substance and Sexual Activity  Alcohol Use Yes   Comment: 07/28/2017 "40oz beer/day; if I have it"     Social History   Substance and Sexual Activity  Drug Use Yes   Types: "Crack" cocaine, Cocaine   Comment: 07/28/2017 "nothing in 3 wks"    Social History   Socioeconomic History   Marital status: Single    Spouse name: Not on file   Number of children: Not on file   Years of education: Not on file   Highest education level: Not on file  Occupational History   Not on file  Tobacco Use   Smoking status: Every Day    Packs/day: 0.50    Years: 39.00    Pack years: 19.50    Types: Cigarettes   Smokeless tobacco: Never  Vaping Use   Vaping Use: Never used   Substance and Sexual Activity   Alcohol use: Yes    Comment: 07/28/2017 "40oz beer/day; if I have it"   Drug use: Yes    Types: "Crack" cocaine, Cocaine    Comment: 07/28/2017 "nothing in 3 wks"   Sexual activity: Not Currently    Birth control/protection: Abstinence  Other Topics Concern   Not on file  Social History Narrative   ** Merged History Encounter **       Social Determinants of Health   Financial Resource Strain: Not on file  Food Insecurity: Not on file  Transportation Needs: Not on file  Physical Activity: Not on file  Stress: Not on file  Social Connections: Not on file   Additional Social History:    Allergies:  No Known Allergies  Labs:  Results for orders placed or performed during the hospital encounter of 11/18/21 (from the past 48 hour(s))  Resp Panel by RT-PCR (Flu A&B, Covid) Nasopharyngeal Swab  Status: None   Collection Time: 11/18/21  7:10 PM   Specimen: Nasopharyngeal Swab; Nasopharyngeal(NP) swabs in vial transport medium  Result Value Ref Range   SARS Coronavirus 2 by RT PCR NEGATIVE NEGATIVE    Comment: (NOTE) SARS-CoV-2 target nucleic acids are NOT DETECTED.  The SARS-CoV-2 RNA is generally detectable in upper respiratory specimens during the acute phase of infection. The lowest concentration of SARS-CoV-2 viral copies this assay can detect is 138 copies/mL. A negative result does not preclude SARS-Cov-2 infection and should not be used as the sole basis for treatment or other patient management decisions. A negative result may occur with  improper specimen collection/handling, submission of specimen other than nasopharyngeal swab, presence of viral mutation(s) within the areas targeted by this assay, and inadequate number of viral copies(<138 copies/mL). A negative result must be combined with clinical observations, patient history, and epidemiological information. The expected result is Negative.  Fact Sheet for Patients:   EntrepreneurPulse.com.au  Fact Sheet for Healthcare Providers:  IncredibleEmployment.be  This test is no t yet approved or cleared by the [place] FDA and  has been authorized for detection and/or diagnosis of SARS-CoV-2 by FDA under an Emergency Use Authorization (EUA). This EUA will remain  in effect (meaning this test can be used) for the duration of the COVID-19 declaration under Section 564(b)(1) of the Act, 21 U.S.C.section 360bbb-3(b)(1), unless the authorization is terminated  or revoked sooner.       Influenza A by PCR NEGATIVE NEGATIVE   Influenza B by PCR NEGATIVE NEGATIVE    Comment: (NOTE) The Xpert Xpress SARS-CoV-2/FLU/RSV plus assay is intended as an aid in the diagnosis of influenza from Nasopharyngeal swab specimens and should not be used as a sole basis for treatment. Nasal washings and aspirates are unacceptable for Xpert Xpress SARS-CoV-2/FLU/RSV testing.  Fact Sheet for Patients: EntrepreneurPulse.com.au  Fact Sheet for Healthcare Providers: IncredibleEmployment.be  This test is not yet approved or cleared by the [place] FDA and has been authorized for detection and/or diagnosis of SARS-CoV-2 by FDA under an Emergency Use Authorization (EUA). This EUA will remain in effect (meaning this test can be used) for the duration of the COVID-19 declaration under Section 564(b)(1) of the Act, 21 U.S.C. section 360bbb-3(b)(1), unless the authorization is terminated or revoked.  Performed at [facility], [place] [address], [place], [place] [address]   Comprehensive metabolic panel     Status: Abnormal   Collection Time: 11/18/21 10:35 PM  Result Value Ref Range   Sodium 147 (H) 135 - 145 mmol/L   Potassium 4.7 3.5 - 5.1 mmol/L   Chloride 117 (H) 98 - 111 mmol/L   CO2 20 (L) 22 - 32 mmol/L   Glucose, Bld 72 70 - 99 mg/dL    Comment: Glucose reference range applies only to  samples taken after fasting for at least 8 hours.   BUN 51 (H) 6 - 20 mg/dL   Creatinine, Ser 1.85 (H) 0.44 - 1.00 mg/dL   Calcium 9.4 8.9 - 10.3 mg/dL   Total Protein 7.3 6.5 - 8.1 g/dL   Albumin 3.0 (L) 3.5 - 5.0 g/dL   AST 26 15 - 41 U/L   ALT 19 0 - 44 U/L   Alkaline Phosphatase 58 38 - 126 U/L   Total Bilirubin 0.3 0.3 - 1.2 mg/dL   GFR, Estimated 32 (L) >60 mL/min    Comment: (NOTE) Calculated using the CKD-EPI Creatinine Equation (2021)    Anion gap 10 5 -  15    Comment: Performed at [facility], [place] [address], [place], [place] [address]  CBC with Differential     Status: Abnormal   Collection Time: 11/18/21 10:35 PM  Result Value Ref Range   WBC 5.0 4.0 - 10.5 K/uL   RBC 3.46 (L) 3.87 - 5.11 MIL/uL   Hemoglobin 9.9 (L) 12.0 - 15.0 g/dL   HCT 31.4 (L) 36.0 - 46.0 %   MCV 90.8 80.0 - 100.0 fL   MCH 28.6 26.0 - 34.0 pg   MCHC 31.5 30.0 - 36.0 g/dL   RDW 17.0 (H) 11.5 - 15.5 %   Platelets 289 150 - 400 K/uL   nRBC 0.0 0.0 - 0.2 %   Neutrophils Relative % 59 %   Neutro Abs 2.9 1.7 - 7.7 K/uL   Lymphocytes Relative 21 %   Lymphs Abs 1.1 0.7 - 4.0 K/uL   Monocytes Relative 14 %   Monocytes Absolute 0.7 0.1 - 1.0 K/uL   Eosinophils Relative 5 %   Eosinophils Absolute 0.2 0.0 - 0.5 K/uL   Basophils Relative 0 %   Basophils Absolute 0.0 0.0 - 0.1 K/uL   Immature Granulocytes 1 %   Abs Immature Granulocytes 0.06 0.00 - 0.07 K/uL    Comment: Performed at [facility] [address], [place], [place] [address]  Protime-INR     Status: None   Collection Time: 11/18/21 10:35 PM  Result Value Ref Range   Prothrombin Time 13.6 11.4 - 15.2 seconds   INR 1.0 0.8 - 1.2    Comment: (NOTE) INR goal varies based on device and disease states. Performed at [facility], [place] [address] [place], [place] [address]   Ethanol     Status: None   Collection Time: 11/18/21 10:37 PM  Result Value Ref Range   Alcohol, Ethyl (B) <10 <10 mg/dL    Comment:  (NOTE) Lowest detectable limit for serum alcohol is 10 mg/dL.  For medical purposes only. Performed at [facility], [place] [address], [place], [place] [address]   Ammonia     Status: None   Collection Time: 11/18/21 10:37 PM  Result Value Ref Range   Ammonia 20 9 - 35 umol/L    Comment: Performed at [facility], [place] [address] [place], [place] [address]  Urine rapid drug screen (hosp performed)     Status: Abnormal   Collection Time: 11/18/21 11:49 PM  Result Value Ref Range   Opiates NONE DETECTED NONE DETECTED   Cocaine POSITIVE (A) NONE DETECTED   Benzodiazepines POSITIVE (A) NONE DETECTED   Amphetamines NONE DETECTED NONE DETECTED   Tetrahydrocannabinol NONE DETECTED NONE DETECTED   Barbiturates POSITIVE (A) NONE DETECTED    Comment: (NOTE) DRUG SCREEN FOR MEDICAL PURPOSES ONLY.  IF CONFIRMATION IS NEEDED FOR ANY PURPOSE, NOTIFY LAB WITHIN 5 DAYS.  LOWEST DETECTABLE LIMITS FOR URINE DRUG SCREEN Drug Class                     Cutoff (ng/mL) Amphetamine and metabolites    1000 Barbiturate and metabolites    200 Benzodiazepine                 262 Tricyclics and metabolites     300 Opiates and metabolites        300 Cocaine and metabolites        300 THC  50 Performed at [facility], [place] [address] [place], [place] [address]   Urinalysis, Routine w reflex microscopic Urine, Catheterized     Status: Abnormal   Collection Time: 11/18/21 11:49 PM  Result Value Ref Range   Color, Urine YELLOW YELLOW   APPearance CLEAR CLEAR   Specific Gravity, Urine 1.025 1.005 - 1.030   pH 5.5 5.0 - 8.0   Glucose, UA NEGATIVE NEGATIVE mg/dL   Hgb urine dipstick NEGATIVE NEGATIVE   Bilirubin Urine NEGATIVE NEGATIVE   Ketones, ur NEGATIVE NEGATIVE mg/dL   Protein, ur 30 (A) NEGATIVE mg/dL   Nitrite NEGATIVE NEGATIVE   Leukocytes,Ua NEGATIVE NEGATIVE    Comment: Performed at [facility] [address], [place], [place] [address]   Urinalysis, Microscopic (reflex)     Status: None   Collection Time: 11/18/21 11:49 PM  Result Value Ref Range   RBC / HPF 0-5 0 - 5 RBC/hpf   WBC, UA 0-5 0 - 5 WBC/hpf   Bacteria, UA NONE SEEN NONE SEEN   Squamous Epithelial / LPF 0-5 0 - 5   Mucus PRESENT     Comment: Performed at [facility], [place] [address], [place], [place] [address]  I-Stat venous blood gas, ED     Status: Abnormal   Collection Time: 11/19/21  3:33 AM  Result Value Ref Range   pH, Ven 7.359 7.250 - 7.430   pCO2, Ven 35.3 (L) 44.0 - 60.0 mmHg   pO2, Ven 35.0 32.0 - 45.0 mmHg   Bicarbonate 19.9 (L) 20.0 - 28.0 mmol/L   TCO2 21 (L) 22 - 32 mmol/L   O2 Saturation 66.0 %   Acid-base deficit 5.0 (H) 0.0 - 2.0 mmol/L   Sodium 149 (H) 135 - 145 mmol/L   Potassium 3.9 3.5 - 5.1 mmol/L   Calcium, Ion 1.25 1.15 - 1.40 mmol/L   HCT 35.0 (L) 36.0 - 46.0 %   Hemoglobin 11.9 (L) 12.0 - 15.0 g/dL   Sample type VENOUS    Comment NOTIFIED PHYSICIAN   Magnesium     Status: None   Collection Time: 11/19/21  3:36 AM  Result Value Ref Range   Magnesium 2.2 1.7 - 2.4 mg/dL    Comment: Performed at [facility], [place] [address], [place],  [address]  Comprehensive metabolic panel     Status: Abnormal   Collection Time: 11/19/21  3:36 AM  Result Value Ref Range   Sodium 146 (H) 135 - 145 mmol/L   Potassium 3.8 3.5 - 5.1 mmol/L   Chloride 116 (H) 98 - 111 mmol/L   CO2 16 (L) 22 - 32 mmol/L   Glucose, Bld 73 70 - 99 mg/dL    Comment: Glucose reference range applies only to samples taken after fasting for at least 8 hours.   BUN 47 (H) 6 - 20 mg/dL   Creatinine, Ser 1.63 (H) 0.44 - 1.00 mg/dL   Calcium 9.6 8.9 - 10.3 mg/dL   Total Protein 7.6 6.5 - 8.1 g/dL   Albumin 3.0 (L) 3.5 - 5.0 g/dL   AST 31 15 - 41 U/L   ALT 21 0 - 44 U/L   Alkaline Phosphatase 66 38 - 126 U/L   Total Bilirubin 0.4 0.3 - 1.2 mg/dL   GFR, Estimated 37 (L) >60 mL/min    Comment: (NOTE) Calculated using the CKD-EPI Creatinine  Equation (2021)    Anion gap 14 5 - 15    Comment: Performed  at [facility], [place] [address], [place], [place] [address]  CBC with Differential/Platelet     Status: Abnormal   Collection Time: 11/19/21  3:36 AM  Result Value Ref Range   WBC 3.2 (L) 4.0 - 10.5 K/uL   RBC 4.38 3.87 - 5.11 MIL/uL   Hemoglobin 12.4 12.0 - 15.0 g/dL    Comment: REPEATED TO VERIFY   HCT 41.3 36.0 - 46.0 %   MCV 94.3 80.0 - 100.0 fL   MCH 28.3 26.0 - 34.0 pg   MCHC 30.0 30.0 - 36.0 g/dL   RDW 17.2 (H) 11.5 - 15.5 %   Platelets 257 150 - 400 K/uL   nRBC 0.0 0.0 - 0.2 %   Neutrophils Relative % 55 %   Neutro Abs 1.8 1.7 - 7.7 K/uL   Lymphocytes Relative 23 %   Lymphs Abs 0.7 0.7 - 4.0 K/uL   Monocytes Relative 15 %   Monocytes Absolute 0.5 0.1 - 1.0 K/uL   Eosinophils Relative 5 %   Eosinophils Absolute 0.2 0.0 - 0.5 K/uL   Basophils Relative 0 %   Basophils Absolute 0.0 0.0 - 0.1 K/uL   Immature Granulocytes 2 %   Abs Immature Granulocytes 0.05 0.00 - 0.07 K/uL    Comment: Performed at [facility], [place] [address] [place], [place] [address]  Protime-INR     Status: None   Collection Time: 11/19/21  3:36 AM  Result Value Ref Range   Prothrombin Time 13.5 11.4 - 15.2 seconds   INR 1.0 0.8 - 1.2    Comment: (NOTE) INR goal varies based on device and disease states. Performed at [facility], [place] [address], [place], [place] [address]   CK     Status: Abnormal   Collection Time: 11/19/21  3:38 AM  Result Value Ref Range   Total CK 325 (H) 38 - 234 U/L    Comment: Performed at [facility], [place] [address], [place], [place] [address]    Current Facility-Administered Medications  Medication Dose Route Frequency Provider Last Rate Last Admin   acetaminophen (TYLENOL) tablet 650 mg  650 mg Oral Q6H PRN Howerter, Justin B, DO       Or   acetaminophen (TYLENOL) suppository 650 mg  650 mg Rectal Q6H PRN Howerter, Justin B, DO       dexmedetomidine (PRECEDEX) 400 MCG/100ML (4  mcg/mL) infusion  0.4-1.2 mcg/kg/hr Intravenous Titrated Raiford Noble Burke, DO 6.6 mL/hr at 11/19/21 1117 0.6 mcg/kg/hr at 09/32/35 5732   folic acid injection 1 mg  1 mg Intravenous Daily Desai, Rahul P, PA-C       haloperidol lactate (HALDOL) injection 2 mg  2 mg Intravenous Q6H PRN Raiford Noble Latif, DO   2 mg at 11/19/21 2025   lactated ringers infusion   Intravenous Continuous Desai, Rahul P, PA-C       LORazepam (ATIVAN) injection 1 mg  1 mg Intravenous Q3H PRN Howerter, Justin B, DO   1 mg at 11/19/21 0701   nicotine (NICODERM CQ - dosed in mg/24 hours) patch 14 mg  14 mg Transdermal Daily PRN Howerter, Justin B, DO       pantoprazole (PROTONIX) injection 40 mg  40 mg Intravenous Q24H Howerter, Justin B, DO   40 mg at 11/19/21 0918   thiamine (B-1) injection 100 mg  100 mg Intravenous Daily Howerter, Justin B, DO   100 mg at 11/19/21 4270   Current Outpatient Medications  Medication Sig Dispense Refill   acetaminophen (TYLENOL) 325 MG tablet Take 2 tablets (650 mg total) by mouth every 6 (six) hours as needed for mild pain, headache or fever.     amLODipine (NORVASC) 10 MG tablet Take 1 tablet (10 mg total) by mouth daily. 30 tablet 1   busPIRone (BUSPAR) 15 MG tablet Take 1 tablet (15 mg total) by mouth 2 (two) times daily. 60 tablet 1   carvedilol (COREG) 3.125 MG tablet Take 1 tablet (3.125 mg total) by mouth 2 (two) times daily with a meal. 60 tablet 1   citalopram (CELEXA) 10 MG tablet Take 1 tablet (10 mg total) by mouth daily. 30 tablet 1   diclofenac Sodium (VOLTAREN) 1 % GEL Apply 4 g topically 4 (four) times daily. 700 g 1   folic acid (FOLVITE) 1 MG tablet Take 1 tablet (1 mg total) by mouth daily. 30 tablet 1   gabapentin (NEURONTIN) 100 MG capsule Take 1 capsule (100 mg total) by mouth 3 (three) times daily. 90 capsule 0   hydrOXYzine (ATARAX/VISTARIL) 10 MG tablet Take 1 tablet (10 mg total) by mouth 3 (three) times daily as needed. As needed for acute anxiety 60 tablet 0    lactulose (CHRONULAC) 10 GM/15ML solution Take 15 mLs (10 g total) by mouth 2 (two) times daily. 236 mL 1   Multiple Vitamin (MULTIVITAMIN WITH MINERALS) TABS tablet Place 1 tablet into feeding tube daily. (Patient taking differently: Take 1 tablet by mouth daily.) 90 tablet    nicotine (NICODERM CQ - DOSED IN MG/24 HOURS) 21 mg/24hr patch Place 1 patch (21 mg total) onto the skin daily. 28 patch 0   pantoprazole (PROTONIX) 40 MG tablet Take 1 tablet (40 mg total) by mouth 2 (two) times daily. 30 tablet 1   thiamine 100 MG tablet Take 1 tablet (100 mg total) by mouth daily. 30 tablet 1   traMADol (ULTRAM) 50 MG tablet Take 1 tablet (50 mg total) by mouth every 6 (six) hours as needed for moderate pain. 30 tablet 0   Vitamin D3 (VITAMIN D) 25 MCG tablet Take 1 tablet (1,000 Units total) by mouth daily. 30 tablet 3    Musculoskeletal: Strength & Muscle Tone:  Not able to test, Pt uncooperative, moving all 4 limbs Gait & Station:  Deferred Patient leans: N/A            Psychiatric Specialty Exam: Full mental status exam cannot be completed due to AMS.   Presentation  General Appearance: Disheveled (in 4 point restrains)  Eye Contact:None  Speech:Pressured  Speech Volume:Increased  Handedness:Right   Mood and Affect  Mood:Depressed; Anxious  Affect: no data recorded   Thought Process  Thought Processes: not able to assess due to AMS Descriptions of Associations:Tangential  Orientation:None  Thought Content: Not able to assess due to AMS  History of Schizophrenia/Schizoaffective disorder:No data recorded Duration of Psychotic Symptoms:No data recorded Hallucinations:No data recorded Ideas of Reference:None  Suicidal Thoughts:No data recorded Homicidal Thoughts:No data recorded  Sensorium  Memory:Not able to assess due to AMS  Judgment:Impaired  Insight:Not able to assess due to AMS   Executive Functions  Concentration:Poor  Attention  Span:Poor  [place] of Knowledge:Poor  Language:Poor   Psychomotor Activity  Psychomotor Activity:Psychomotor Activity: Increased; Restlessness   Assets  Assets:   Sleep  Sleep:No data recorded  Physical Exam: Physical Exam Vitals and nursing note reviewed.  Constitutional:      General: She is in acute distress.  Appearance: She is ill-appearing.  Neurological:     Mental Status: She is disoriented.   Review of Systems  Reason unable to perform ROS: Due to AMS.  Blood pressure (!) 182/100, pulse (!) 126, temperature 98.8 F (37.1 C), temperature source Oral, resp. rate (!) 21, SpO2 96 %. There is no height or weight on file to calculate BMI.   Armando Reichert, MD 11/19/2021 11:27 AM

## 2021-11-19 NOTE — H&P (Signed)
°History and Physical  ° ° °PLEASE NOTE THAT DRAGON DICTATION SOFTWARE WAS USED IN THE CONSTRUCTION OF THIS NOTE. ° ° °Sherry Baker MRN:[id number] DOB: 02/02/1966 DOA: 11/18/2021 ° °PCP: Pcp, No  °Patient coming from: home  ° °I have personally briefly reviewed patient's old medical records in [facility] ° °Chief Complaint: Altered mental status ° °HPI: Sherry Baker is a 56 y.o. female with medical history significant for polysubstance abuse, including cocaine use as well as alcohol abuse, type I bipolar disorder, central hypertension, generalized anxiety disorder, stage IIIb chronic kidney disease with associated baseline creatinine 1.3-1.6, chronic anemia with baseline hemoglobin 10-12, who is admitted to [facility] on 11/18/2021 with acute metabolic encephalopathy after presenting from home to [facility] ED for evaluation of altered mental status. ° °In the setting of the patient's presenting altered mental status and associated ability to contribute to the history at this time, following history is provided via the patient's family as well as via EMS, my discussions with the EDP, and via chart review. ° °Pressure review, the patient was recently hospitalized at [facility] from 10/24/2021 -11/16/2021 initially for altered mental status this felt to be on the basis of her polysubstance abuse, including ensuing development of alcohol withdrawal for which she was started on Precedex drip, ultimately aspirated, developed acute hypoxic respiratory failure, with transferred into the ICU.  Temporary SNF placement for temporary rehab in the context of generalized deconditioning given a 3-week course in the hospital was recommended to the patient.  However, the patient ultimately refused this recommendation and was subsequent discharged home on 11/16/2021. ° °Earlier today, her family reportedly contacted EMS for the patient to be evaluated for altered mental status, with the patient reportedly appearing  confused, agitated relative to the mental status she presents to the time of discharge from the hospital on 11/16/2021.  EMS found patient to be significantly agitated, and ultimately administered Haldol 5 mg IM x1, Versed 5 mg IV ED x1 in route to [facility] emergency department for further evaluation and management of altered mental status. ° °Per chart review, medical history notable for stage IIIb chronic kidney disease, with baseline creatinine 1.3-1.6, most recent prior creatinine data point noted to be 1.63 on 11/16/2021.  She also has a history of chronic normocytic/normochromic anemia, with baseline hemoglobin 10-12, with most recent prior value of 10.5 on 11/13/2021. ° ° ° °ED Course:  °Vital signs in the ED were notable for the following: Afebrile; heart rate 7778; blood pressure 133/74 - 158/85; respiratory rate 16, oxygen saturation 97 to 98% on room air. ° °Labs were notable for the following: CMP notable for the following: Sodium 147 compared to most recent prior value of 139 on 11/16/2021, chloride 117, bicarbonate 20, anion gap 10, creatinine 1.85 relative to most recent prior value of 1.63 on 11/16/2021, BUN to creatinine ratio 27.6, glucose 72, calcium corrected for mild hypoalbuminemia 10.2, albumin 3.0, otherwise, liver enzymes are within normal months.  Ammonia 20.  CBC notable for will Patel, 5000, hemoglobin 9.9, associated normocytic/normochromic findings.  INR 1.0.  Serum ethanol less than 10.  Urinary drug screen positive for cocaine as well as benzodiazepines (within the context of having received Versed in route to the ED this evening); urinalysis notable for demonstrating no white blood cells, no bacteria, 30 protein, no hemoglobin, no RBCs, and specific gravity 1.025.  COVID-19/influenza PCR [place] negative. ° °Imaging and additional notable ED work-up: EKG was ordered with result currently pending.    Chest x-ray showed no evidence of infiltrate, overt edema, no evidence of pleural effusion  or pneumothorax.  CT head showed no evidence of acute intracranial process, clear evidence of intracranial hemorrhage. ° °While in the ED, the following were administered: The patient was noted to be agitated in the ED, combative towards staff, prompting receipt of Ativan 2 mg IM x1 as the sole pharmacologic intervention over the course of her ED course.  ° °Subsequently, the patient was admitted for further evaluation management of presenting suspected acute metabolic encephalopathy complicated by AKI on stage IIIb CKD as well as dehydration associated with hypernatremia. ° ° ° °Review of Systems: As per HPI otherwise 10 point review of systems negative.  ° °Past Medical History:  °Diagnosis Date  ° Alcohol abuse   ° Allergy   ° PCNS swelling  ° Arthritis   ° Bipolar 1 disorder (HCC)   ° Cancer (HCC) 01/21/2017  ° rectal cancer  ° Cancer (HCC)   ° Cirrhosis of liver (HCC)   ° Depression   ° Genetic testing 03/24/2017  ° Ms. Nieto underwent genetic counseling and testing for hereditary cancer syndromes on 02/17/2017. Her results were negative for mutations in all 46 genes analyzed by [employer]'s 46-gene Common Hereditary Cancers Panel. Genes analyzed include: APC, ATM, AXIN2, BARD1, BMPR1A, BRCA1, BRCA2, BRIP1, CDH1, CDKN2A, CHEK2, CTNNA1, DICER1, EPCAM, GREM1, HOXB13, KIT, MEN1, MLH1, MSH2, MSH3, MSH6, MUTYH, NBN,  ° Hypertension   ° ° °Past Surgical History:  °Procedure Laterality Date  ° ABDOMINAL PERINEAL BOWEL RESECTION N/A 06/18/2017  ° Procedure: ABDOMINAL PERINEAL RESECTION ERAS PATHWAY;  Surgeon: Thomas, Alicia, MD;  Location: [facility] ORS;  Service: General;  Laterality: N/A;  ° COLON SURGERY    ° COLONOSCOPY WITH PROPOFOL Left 01/21/2017  ° Procedure: COLONOSCOPY WITH PROPOFOL;  Surgeon: Parag Brahmbhatt, MD;  Location: [facility] ENDOSCOPY;  Service: Gastroenterology;  Laterality: Left;  ° FRACTURE SURGERY    ° MANDIBLE FRACTURE SURGERY    ° ° °Social History: ° reports that she has been smoking cigarettes. She has a  19.50 pack-year smoking history. She has never used smokeless tobacco. She reports current alcohol use. She reports current drug use. Drugs: "Crack" cocaine and Cocaine. ° ° °No Known Allergies ° °Family History  °Problem Relation Age of Onset  ° Cancer Mother 45  °     Originating in the abdomen, otherwise unknwon  ° Cancer - Other Maternal Aunt   °     Throat  ° ° °Family history reviewed and not pertinent  ° ° °Prior to Admission medications   °Medication Sig Start Date End Date Taking? Authorizing Provider  °acetaminophen (TYLENOL) 325 MG tablet Take 2 tablets (650 mg total) by mouth every 6 (six) hours as needed for mild pain, headache or fever. 09/30/21   Ellis, Allison L, NP  °amLODipine (NORVASC) 10 MG tablet Take 1 tablet (10 mg total) by mouth daily. 11/16/21   Adhikari, Amrit, MD  °busPIRone (BUSPAR) 15 MG tablet Take 1 tablet (15 mg total) by mouth 2 (two) times daily. 11/16/21   Adhikari, Amrit, MD  °carvedilol (COREG) 3.125 MG tablet Take 1 tablet (3.125 mg total) by mouth 2 (two) times daily with a meal. 11/16/21   Adhikari, Amrit, MD  °citalopram (CELEXA) 10 MG tablet Take 1 tablet (10 mg total) by mouth daily. 11/16/21   Adhikari, Amrit, MD  °diclofenac Sodium (VOLTAREN) 1 % GEL Apply 4 g topically 4 (four) times daily. 09/30/21   Ellis, Allison L, NP  °folic   acid (FOLVITE) 1 MG tablet Take 1 tablet (1 mg total) by mouth daily. 11/17/21   Adhikari, Amrit, MD  °gabapentin (NEURONTIN) 100 MG capsule Take 1 capsule (100 mg total) by mouth 3 (three) times daily. 11/16/21   Adhikari, Amrit, MD  °hydrOXYzine (ATARAX/VISTARIL) 10 MG tablet Take 1 tablet (10 mg total) by mouth 3 (three) times daily as needed. As needed for acute anxiety 11/15/20   McClung, Angela M, PA-C  °lactulose (CHRONULAC) 10 GM/15ML solution Take 15 mLs (10 g total) by mouth 2 (two) times daily. 11/16/21   Adhikari, Amrit, MD  °Multiple Vitamin (MULTIVITAMIN WITH MINERALS) TABS tablet Place 1 tablet into feeding tube daily. °Patient taking  differently: Take 1 tablet by mouth daily. 04/09/21   Pokhrel, Laxman, MD  °nicotine (NICODERM CQ - DOSED IN MG/24 HOURS) 21 mg/24hr patch Place 1 patch (21 mg total) onto the skin daily. 11/17/21   Adhikari, Amrit, MD  °pantoprazole (PROTONIX) 40 MG tablet Take 1 tablet (40 mg total) by mouth 2 (two) times daily. 11/16/21 11/16/22  Adhikari, Amrit, MD  °thiamine 100 MG tablet Take 1 tablet (100 mg total) by mouth daily. 11/16/21   Adhikari, Amrit, MD  °traMADol (ULTRAM) 50 MG tablet Take 1 tablet (50 mg total) by mouth every 6 (six) hours as needed for moderate pain. 09/30/21   Ellis, Allison L, NP  °Vitamin D3 (VITAMIN D) 25 MCG tablet Take 1 tablet (1,000 Units total) by mouth daily. 09/30/21   Ellis, Allison L, NP  °ziprasidone (GEODON) 40 MG capsule Take 1 capsule (40 mg total) by mouth 2 (two) times daily with a meal. 11/06/11 01/09/12  Wentz, Elliott, MD  ° ° ° °Objective  ° ° °Physical Exam: °Vitals:  ° 11/18/21 1825 11/18/21 2300  °BP: 133/74 (!) 158/85  °Pulse: 77 78  °Resp: 16 13  °Temp: 97.9 °F (36.6 °C)   °TempSrc: Axillary   °SpO2: 98% 97%  ° ° °General: appears to be stated age; somnolent; will briefly open eyes to verbal stimuli, before found back asleep; nonverbal, unable to follow directions °Skin: warm, dry, no rash °Head:  AT/[place] °Mouth:  Oral mucosa membranes appear dry, normal dentition °Neck: supple; trachea midline °Heart:  RRR; did not appreciate any M/R/G °Lungs: CTAB, did not appreciate any wheezes, rales, or rhonchi °Abdomen: + BS; soft, ND, °Vascular: 2+ pedal pulses b/l; 2+ radial pulses b/l °Extremities: no peripheral edema, no muscle wasting °Neuro: In the setting of the patient's current mental status and associated inability to follow instructions, unable to perform full neurologic exam at this time.  As such, assessment of strength, sensation, and cranial nerves is limited at this time. Patient noted to spontaneously move all 4 extremities. No tremors.  ° ° ° °Labs on Admission: I have  personally reviewed following labs and imaging studies ° °CBC: °Recent Labs  °Lab 11/13/21 °0236 11/18/21 °2235  °WBC 5.8 5.0  °NEUTROABS  --  2.9  °HGB 10.5* 9.9*  °HCT 32.2* 31.4*  °MCV 87.7 90.8  °PLT 274 289  ° °Basic Metabolic Panel: °Recent Labs  °Lab 11/12/21 °0044 11/13/21 °0236 11/15/21 °0030 11/16/21 °0109 11/18/21 °2235  °NA 134* 133* 138 139 147*  °K 4.3 3.8 4.1 3.6 4.7  °CL 98 101 102 106 117*  °CO2 25 22 24 24 20*  °GLUCOSE 103* 99 94 83 72  °BUN 50* 52* 64* 55* 51*  °CREATININE 1.67* 1.43* 2.12* 1.63* 1.85*  °CALCIUM 9.5 9.4 9.7 9.0 9.4  ° °GFR: °Estimated Creatinine Clearance: 24.1   mL/min (A) (by C-G formula based on SCr of 1.85 mg/dL (H)). °Liver Function Tests: °Recent Labs  °Lab 11/18/21 °2235  °AST 26  °ALT 19  °ALKPHOS 58  °BILITOT 0.3  °PROT 7.3  °ALBUMIN 3.0*  ° °No results for input(s): LIPASE, AMYLASE in the last 168 hours. °Recent Labs  °Lab 11/18/21 °2237  °AMMONIA 20  ° °Coagulation Profile: °Recent Labs  °Lab 11/18/21 °2235  °INR 1.0  ° °Cardiac Enzymes: °No results for input(s): CKTOTAL, CKMB, CKMBINDEX, TROPONINI in the last 168 hours. °BNP (last 3 results) °No results for input(s): PROBNP in the last 8760 hours. °HbA1C: °No results for input(s): HGBA1C in the last 72 hours. °CBG: °Recent Labs  °Lab 11/14/21 °0744 11/14/21 °1208 11/14/21 °1606 11/14/21 °1917 11/15/21 °2003  °GLUCAP 77 102* 101* 109* 90  ° °Lipid Profile: °No results for input(s): CHOL, HDL, LDLCALC, TRIG, CHOLHDL, LDLDIRECT in the last 72 hours. °Thyroid Function Tests: °No results for input(s): TSH, T4TOTAL, FREET4, T3FREE, THYROIDAB in the last 72 hours. °Anemia Panel: °No results for input(s): VITAMINB12, FOLATE, FERRITIN, TIBC, IRON, RETICCTPCT in the last 72 hours. °Urine analysis: °   °Component Value Date/Time  ° COLORURINE YELLOW 10/25/2021 0145  ° APPEARANCEUR CLOUDY (A) 10/25/2021 0145  ° LABSPEC 1.011 10/25/2021 0145  ° PHURINE 7.0 10/25/2021 0145  ° GLUCOSEU NEGATIVE 10/25/2021 0145  ° HGBUR SMALL (A)  10/25/2021 0145  ° BILIRUBINUR NEGATIVE 10/25/2021 0145  ° KETONESUR 5 (A) 10/25/2021 0145  ° PROTEINUR 100 (A) 10/25/2021 0145  ° UROBILINOGEN 0.2 12/23/2017 1417  ° NITRITE NEGATIVE 10/25/2021 0145  ° LEUKOCYTESUR LARGE (A) 10/25/2021 0145  ° ° °Radiological Exams on Admission: °DG Chest 1 View ° °Result Date: 11/18/2021 °CLINICAL DATA:  Altered mental status. EXAM: CHEST  1 VIEW COMPARISON:  November 03, 2021 FINDINGS: The heart size and mediastinal contours are within normal limits. Mild increased pulmonary interstitium is identified bilaterally. The visualized skeletal structures are unremarkable. IMPRESSION: Mild increased pulmonary interstitium is identified bilaterally. Mild pulmonary edema versus viral infection. Electronically Signed   By: Wei-Chen  Lin M.D.   On: 11/18/2021 19:03  ° °CT HEAD WO CONTRAST (5MM) ° °Result Date: 11/18/2021 °CLINICAL DATA:  Mental status changes.  Smoking crack at home. EXAM: CT HEAD WITHOUT CONTRAST TECHNIQUE: Contiguous axial images were obtained from the base of the skull through the vertex without intravenous contrast. RADIATION DOSE REDUCTION: This exam was performed according to the departmental dose-optimization program which includes automated exposure control, adjustment of the mA and/or kV according to patient size and/or use of iterative reconstruction technique. COMPARISON:  10/25/2021 FINDINGS: Brain: There is central and cortical atrophy. There is no intra or extra-axial fluid collection or mass lesion. The basilar cisterns and ventricles have a normal appearance. There is no CT evidence for acute infarction or hemorrhage. Vascular: There is dense atherosclerotic calcification of the internal carotid arteries. No hyperdense vessels. Skull: Normal. Negative for fracture or focal lesion. Sinuses/Orbits: LEFT mastoid effusion, stable. Minimal opacification of the paranasal sinuses. Other: None. IMPRESSION: 1.  No evidence for acute intracranial abnormality. 2. Central  and cortical atrophy. 3. LEFT mastoid effusion. Electronically Signed   By: Elizabeth  Brown M.D.   On: 11/18/2021 18:55   ° ° °EKG: Independently reviewed, with result as described above.  ° ° °Assessment/Plan  ° °Principal Problem: °  Acute metabolic encephalopathy °Active Problems: °  Hypertension °  GERD (gastroesophageal reflux disease) °  Polysubstance abuse (HCC) °  Hypernatremia °  AKI (acute kidney injury) (HCC) °    Dehydration   Acute prerenal azotemia      #) Acute metabolic encephalopathy: Present patient presents with confusion, agitation, reportedly of 1 to 2-day duration following extended hospitalization for for 3 weeks for acute encephalopathy in the context of polysubstance abuse.  Suspect that this evening's encephalopathic presentation is multifactorial in nature, with at least partial metabolic contributions in the setting of dehydration, resultant hypernatremia, acute kidney injury superimposed on stage IIIb chronic kidney disease, likely causing diminished renal clearance of multiple renal metabolized outpatient medications that have central acting ramifications, in addition to the toxic implications of apparent interval cocaine use, in setting of urinary drug screen positive for such, within the context of a documented history of prior cocaine use.  She does have a history of chronic alcohol abuse, with recent hospitalization notable for suspected alcohol abuse, it is unlikely that acute alcohol withdrawal is playing a significant role contributing to her presenting encephalopathic picture, as she was able to go through complete withdrawals over the 3-week hospital course presenting 72 hours ago, with interval at home of less than 72 hours unlikely to have rendered sufficient time to develop a physiologic dependence upon alcohol.  No overt evidence of underlying infectious contribution, including urinalysis inconsistent with UTI, COVID-19/influenza PCR found to be negative.   Additionally, chest x-ray shows no evidence of infiltrate to suggest pneumonia.  No evidence of overt rash to suggest cellulitis, while liver enzymes are found to be nonelevated.   Plan: N.p.o. for now, until mental status improves physically that the patient is able to participate in/passed nursing bedside swallow screen.  Every 4 hours neurochecks have been ordered.  Check CPK, MMA, TSH, ionized calcium level, given borderline elevated associated serum calcium level, check VBG to evaluate for any contribution from hypercapnic encephalopathy, fall precautions ordered.  Prn IV Ativan ordered for additional agitation/combativeness towards staff.  Otherwise, hold home central acting medications for now to eliminate potential confounding contributions from these home medications.  Further evaluation and management of dehydration with consequential hyponatremia as well as AKI on CKD 3B, as further detailed above, including administration of continuous LR.      #) Hypovolemic hypernatremia: Presenting serum sodium 147, relative to most recent prior value 139 on 11/16/2021. differential at this time appears to favor free water deficiency as a consequence of both decreased in recent free water intake. Will further evaluate via urine studies, as further detailed below, and provide gentle IV fluids for rehydration purposes, will closely monitoring for any evidence of acute volume overload in the setting of potential very mild pulmonary edema observed on presenting chest x-ray, which may be as a cardiogenic consequence of recent cocaine use.  With instruction on mind, or friend from IV fluid bolus at this time. per my chart review, does not appear that she is on lithium as an outpatient, although we will check a serum lithium level given a documented history of underlying type I bipolar disorder to rule out any peripheral influences and may be contributing to diabetes insipidus. No evidence of associated acute focal  neurologic deficits, no report of recent trauma.     Plan: Check random urine sodium, random urine creatinine, urine osmolality, serum osmolality.  Monitor strict I's and O's and daily weights.  Continuous lactated Ringer's overnight, with repeat CMP in the morning.  Check serum lithium level, as above.  Further evaluation and management of severe dehydration, as further detailed below.        #) Dehydration: Clinical suspicion for such, including  the appearance of dry oral mucous membranes as well as laboratory findings notable for acute prerenal azotemia and UA demonstrating elevated specific gravity. Appears to be in the setting of diminished oral intake of water, exacerbated by presenting confusion.  No e/o associated hypotension, although presentation does appear to be associated AKI superimposed on stage IIIb CKD, as further detailed below   Plan: Monitor strict I's and O's.  Daily weights.  Repeat CMP in the morning. IVF's in form of lactated Ringer's at 100 cc/h.        #) Acute Kidney Injury on CKD 3B: In the setting of a documented history of stage IIIb CKD with baseline creatinine 1.3-1.6, presenting serum creatinine noted to be 1.85.  Suspect that this is prerenal in nature in the setting of dehydration, from likely recent reduction in oral intake of water, as evidenced by laboratory evidence of acute prerenal azotemia as well as urinalysis demonstrating elevated specific gravity.  Otherwise, urinalysis demonstrates no evidence of urinary casts, although 30 protein noted.   Plan: Monitor strict I's and O's no weights.  Tempt avoid nephrotoxic agents.  Repeat CMP in the morning.  Lactated Ringer's, as above.  Add on random urine status post random urine creatinine.  Particular the setting of cocaine use, also check CPK level.  Check random protein to creatinine ratio.        #) History of polysubstance abuse: Documented history of alcohol abuse as well as cocaine abuse, with  urinary drug screen this evening positive for cocaine.  Of note, serum ethanol level nonelevated.  Plan: Advised consult with the transition of care team.  As the patient's mental status improved, anticipate counseling the patient on the importance of reduction/elimination of recreational drug use.  Repeat CMP and CBC in the morning.  Check INR in the AM.       #) Essential Hypertension: documented h/o such, with outpatient antihypertensive regimen including Norvasc, Coreg.  SBP's in the ED today: In the 130s 150s mmhg.   Plan: Close monitoring of subsequent BP via routine VS. in the setting of current n.p.o. status, will hold home and emergency medications for now.       #) Generalized anxiety disorder: Documented history of such, on scheduled Celexa, scheduled BuSpar, as well as as needed hydroxyzine.  In the setting of presenting altered mental status, will hold home central acting medications for now to eliminate potential confounding variables posed by these home meds.  Plan: Hold home Celexa, BuSpar, as needed hydroxyzine for now, as further detailed above.  As needed IV Ativan for anxiety/agitation, preferred over IM versus p.o. due to the more short acting implications of this med given desire to reduce pharmacologic variables.        #) GERD: documented h/o such; on Protonix as outpatient.   Plan: In the setting of current n.p.o. status, hold home oral Protonix; rather, I have placed order for daily Protonix via IV route of administration.      #) Chronic tobacco abuse: Documented history of such.  Plan: Placed order for prn nicotine patch for use during his hospitalization.  As patient's mental status improves, and anticipate counseling of the patient on the importance of further reduction in her smoking habits.       #) Chronic normocytic anemia: Documented history of such, with baseline hemoglobin noted to be 10-12, with presenting hemoglobin consistent with  this range, and associated normocytic/chronic findings, in the absence of any evidence of acute bleed.  INR noted to  be nonelevated at 1.0. ° °Plan: Repeat CBC in the morning.  Given urinary drug screen positive for cocaine, will repeat INR in the morning, as this can be associated with hepatotoxic implications. ° ° ° ° ° °DVT prophylaxis: SCD's   °Code Status: Full code °Family Communication: none °Disposition Plan: Per Rounding Team °Consults called: none;  °Admission status: obs ° ° °PLEASE NOTE THAT DRAGON DICTATION SOFTWARE WAS USED IN THE CONSTRUCTION OF THIS NOTE. ° ° ° B  DO °[facility] ° °From 7PM - 7AM ° ° °11/19/2021, 12:09 AM  ° °  °

## 2021-11-19 NOTE — Progress Notes (Signed)
Pts cousin Crystal took patient's phone home

## 2021-11-19 NOTE — Progress Notes (Signed)
PROGRESS NOTE    Sherry Baker  MVE:[id number] DOB: 14-Aug-1966 DOA: 11/18/2021 PCP: Pcp, No   Brief Narrative: The patient is a 56 year old thin cachectic Caucasian female with a past medical history significant for not limited to polysubstance abuse including cocaine abuse as well as alcohol abuse, type I bipolar disorder, essential hypertension, generalized anxiety disorder, stage III chronic kidney disease reviewed with the baseline creatinine 1.3-1.6, chronic anemia with a baseline hemoglobin of 10-12 who presented to ED with acute encephalopathy and altered mental status.  She is unable to participate and contribute to the history as well as time of presentation given her confusion and agitation.  She was recently hospitalized from 10/24/2021 until 11/16/2021 for altered mental status that was felt in the basis of polysubstance abuse including ensuing development of alcohol withdrawal she started on Precedex drip for.  She also been aspirated and developed acute hypoxic respiratory failure discharged to the ICU at that time.  She subsequently improved and SNF was recommended for her generalized deconditioning from her prolonged hospital course however she refused this recommendation and was subsequently discharged home on 11/16/2021.  EMS was contacted for AMS this time as she appeared confused, significantly agitated after 3 days from being discharged.  In route to the ED she was given Haldol, Versed.  She continued to be significantly confused and agitated and had to be put in restraints and was given Haldol and lorazepam.  Given her agitation and confusion psychiatry was consulted as well as neurology.  She was placed on a Precedex drip for her significant combativeness and was transferred to the critical care service for further evaluation recommendations acute metabolic encephalopathy  Assessment & Plan:   Principal Problem:   Acute metabolic encephalopathy Active Problems:   Hypertension    GERD (gastroesophageal reflux disease)   Polysubstance abuse (HCC)   Hypernatremia   AKI (acute kidney injury) ([place])   Dehydration   Acute prerenal azotemia  Acute metabolic encephalopathy -Likely multifactorial as she presented with confusion, significant agitation following 3-week hospitalization.  She remained significantly encephalopathic and could have some metabolic derangements driving her confusion as well as dehydration and resultant hyponatremia and AKI on superimposed on CKD stage IIIb.  UDS was positive for benzos, barbiturates as well as cocaine.  She does have a history of chronic alcohol abuse and will continue Precedex drip. -PCCM was consulted for further evaluation -Psychiatry was also consulted for significant agitation and they recommended continuing Haldol 2 mg every 6 as needed for agitation and adding 1 mg of Ativan each time Haldol was given to reduce the risk of EPS -They also recommended continuing to treat the metabolic issues and continue folic acid and thiamine and holding Celexa and buspirone -Neurology was also consulted -Head CT was done on admission -Continue monitor CIWA protocol and QTC monitoring -PCCM recommending considering adding Librium 25 mg 3 times daily as well as considering clonidine and reducing the dose of phenobarbital to 65 mg p.o. 3 times daily and consider adding Seroquel -Continue with restraints for safety as well as seizure precautions and currently she is n.p.o. given her current condition -Lithium level was less than 0.06 -TSH was 3.777 -Currently she is getting IV thiamine 500 g every 8 for 2 days and then 250 mg IV daily for 6 doses and then 100 mg IV daily -Currently her phenobarbital is 65 mg IV 3 times daily for 6 doses and then will be cut down to 32.4 mg every 8 for 6 doses  AKI on CKD stage IIIb Metabolic Acidosis -BUNs/creatinine on admission was 51/1.85 is now 47/1.60 -Patient does have a metabolic acidosis with a CO2 of 16,  chloride level of 116, anion gap of 14 -Continue with IV fluid hydration with lactated Ringer's at 75 MLS per hour -Strict I's and O's and daily weights and continue to avoid nephrotoxic medications, contrast dyes, hypotension renally dose medications -Repeat CMP in a.m.  Hypernatremia -Mild and sodium went from 147 -> 149 -> 146 -Continue with IV fluid hydration as above  Essential Hypertension -She takes Norvasc and carvedilol in outpatient setting -Resume home antihypertensives when she is calm and as agitated -Use as needed's for her blood pressure currently  Polysubstance abuse Tobacco abuse Alcohol and cocaine abuse -Psychiatry has been consulted and she is on a CIWA protocol with a Precedex drip -She will need resources when she is improved -She will need smoking cessation counseling given while she is improved and will continue nicotine 14 mg transdermally every 24 hours as needed for smoking cessation  Bipolar 1 Disorder, Depression, Anxiety -Holding her Celexa and buspirone -Psychiatry has been consulted for further evaluation  GERD/GI prophylaxis -Continue with pantoprazole 40 mg IV q. 24  DVT prophylaxis: Heparin 5,000 units sq q8h Code Status: FULL CODE Family Communication: No family currently at bedside Disposition Plan: Pending further clinical improvement and she has been transferred to the ICU for Precedex drip and closer monitoring  Status is: Inpatient  Remains inpatient appropriate because: She is extremely encephalopathic and agitated and will need to be at baseline mentation prior to a safe discharge disposition   Consultants:  PCCM Neurology Psychiatry  Procedures: None  Antimicrobials:  Anti-infectives (From admission, onward)    None        Subjective: Seen and examined at bedside and she is thrashing around in the bed extremely confused and agitated and not oriented.  She starts screaming and actively thrashing and turning.  In soft  wrist restraints given that she is a danger to herself and everybody else around her.  Unable to be oriented and extremely confused and agitated.  Objective: Vitals:   11/19/21 1330 11/19/21 1345 11/19/21 1400 11/19/21 1415  BP: (!) 146/84 (!) 150/81 (!) 154/87 (!) 155/86  Pulse: 84 84 83 83  Resp: (!) 23 (!) 23 (!) 22 (!) 22  Temp:      TempSrc:      SpO2: 93% 94% 95% 95%    Intake/Output Summary (Last 24 hours) at 11/19/2021 1501 Last data filed at 11/19/2021 1200 Gross per 24 hour  Intake 8.55 ml  Output 200 ml  Net -191.45 ml   There were no vitals filed for this visit.  Examination: Physical Exam:  Constitutional: Thin middle-aged Caucasian female who is extremely agitated and frustrated and not oriented  Eyes: Lids and conjunctivae normal, sclerae anicteric  ENMT: External Ears, Nose appear normal. Grossly normal hearing. Mucous membranes are moist.  Neck: Appears normal, supple, no cervical masses, normal ROM, no appreciable thyromegaly; no appreciable JVD Respiratory: Diminished to auscultation bilaterally with coarse breath sounds, no wheezing, rales, rhonchi or crackles.  She has an increased respiratory rate and is tachypneic but does not really have labored breathing Cardiovascular: Tachycardic rate but regular rhythm, no murmurs / rubs / gallops. S1 and S2 auscultated. No extremity edema.  Abdomen: Soft, non-tender, non-distended. Bowel sounds positive.  GU: Deferred. Musculoskeletal: No clubbing / cyanosis of digits/nails. No joint deformity upper and lower extremities.  Skin: No rashes, lesions, ulcers.  No induration; Warm and dry.  Neurologic: Does not follow commands Psychiatric: Impaired judgment and insight.  She is awake but she is not alert and oriented x 3.  Impaired mood and appropriate affect.   Data Reviewed: I have personally reviewed following labs and imaging studies  CBC: Recent Labs  Lab 11/13/21 0236 11/18/21 2235 11/19/21 0333 11/19/21 0336   WBC 5.8 5.0  --  3.2*  NEUTROABS  --  2.9  --  1.8  HGB 10.5* 9.9* 11.9* 12.4  HCT 32.2* 31.4* 35.0* 41.3  MCV 87.7 90.8  --  94.3  PLT 274 289  --  671   Basic Metabolic Panel: Recent Labs  Lab 11/13/21 0236 11/15/21 0030 11/16/21 0109 11/18/21 2235 11/19/21 0333 11/19/21 0336  NA 133* 138 139 147* 149* 146*  K 3.8 4.1 3.6 4.7 3.9 3.8  CL 101 102 106 117*  --  116*  CO2 22 24 24  20*  --  16*  GLUCOSE 99 94 83 72  --  73  BUN 52* 64* 55* 51*  --  47*  CREATININE 1.43* 2.12* 1.63* 1.85*  --  1.63*  CALCIUM 9.4 9.7 9.0 9.4  --  9.6  MG  --   --   --   --   --  2.2   GFR: Estimated Creatinine Clearance: 27.4 mL/min (A) (by C-G formula based on SCr of 1.63 mg/dL (H)). Liver Function Tests: Recent Labs  Lab 11/18/21 2235 11/19/21 0336  AST 26 31  ALT 19 21  ALKPHOS 58 66  BILITOT 0.3 0.4  PROT 7.3 7.6  ALBUMIN 3.0* 3.0*   No results for input(s): LIPASE, AMYLASE in the last 168 hours. Recent Labs  Lab 11/18/21 2237  AMMONIA 20   Coagulation Profile: Recent Labs  Lab 11/18/21 2235 11/19/21 0336  INR 1.0 1.0   Cardiac Enzymes: Recent Labs  Lab 11/19/21 0338  CKTOTAL 325*   BNP (last 3 results) No results for input(s): PROBNP in the last 8760 hours. HbA1C: No results for input(s): HGBA1C in the last 72 hours. CBG: Recent Labs  Lab 11/14/21 1208 11/14/21 1606 11/14/21 1917 11/15/21 2003 11/19/21 1157  GLUCAP 102* 101* 109* 90 73   Lipid Profile: No results for input(s): CHOL, HDL, LDLCALC, TRIG, CHOLHDL, LDLDIRECT in the last 72 hours. Thyroid Function Tests: Recent Labs    11/19/21 1217  TSH 3.777   Anemia Panel: Recent Labs    11/19/21 1217  VITAMINB12 525   Sepsis Labs: Recent Labs  Lab 11/19/21 1217  LATICACIDVEN 1.5    Recent Results (from the past 240 hour(s))  Resp Panel by RT-PCR (Flu A&B, Covid) Nasopharyngeal Swab     Status: None   Collection Time: 11/18/21  7:10 PM   Specimen: Nasopharyngeal Swab; Nasopharyngeal(NP)  swabs in vial transport medium  Result Value Ref Range Status   SARS Coronavirus 2 by RT PCR NEGATIVE NEGATIVE Final    Comment: (NOTE) SARS-CoV-2 target nucleic acids are NOT DETECTED.  The SARS-CoV-2 RNA is generally detectable in upper respiratory specimens during the acute phase of infection. The lowest concentration of SARS-CoV-2 viral copies this assay can detect is 138 copies/mL. A negative result does not preclude SARS-Cov-2 infection and should not be used as the sole basis for treatment or other patient management decisions. A negative result may occur with  improper specimen collection/handling, submission of specimen other than nasopharyngeal swab, presence of viral mutation(s) within the areas targeted by this assay, and inadequate number of viral  copies(<138 copies/mL). A negative result must be combined with clinical observations, patient history, and epidemiological information. The expected result is Negative.  Fact Sheet for Patients:  EntrepreneurPulse.com.au  Fact Sheet for Healthcare Providers:  IncredibleEmployment.be  This test is no t yet approved or cleared by the [place] FDA and  has been authorized for detection and/or diagnosis of SARS-CoV-2 by FDA under an Emergency Use Authorization (EUA). This EUA will remain  in effect (meaning this test can be used) for the duration of the COVID-19 declaration under Section 564(b)(1) of the Act, 21 U.S.C.section 360bbb-3(b)(1), unless the authorization is terminated  or revoked sooner.       Influenza A by PCR NEGATIVE NEGATIVE Final   Influenza B by PCR NEGATIVE NEGATIVE Final    Comment: (NOTE) The Xpert Xpress SARS-CoV-2/FLU/RSV plus assay is intended as an aid in the diagnosis of influenza from Nasopharyngeal swab specimens and should not be used as a sole basis for treatment. Nasal washings and aspirates are unacceptable for Xpert Xpress  SARS-CoV-2/FLU/RSV testing.  Fact Sheet for Patients: EntrepreneurPulse.com.au  Fact Sheet for Healthcare Providers: IncredibleEmployment.be  This test is not yet approved or cleared by the [place] FDA and has been authorized for detection and/or diagnosis of SARS-CoV-2 by FDA under an Emergency Use Authorization (EUA). This EUA will remain in effect (meaning this test can be used) for the duration of the COVID-19 declaration under Section 564(b)(1) of the Act, 21 U.S.C. section 360bbb-3(b)(1), unless the authorization is terminated or revoked.  Performed at [facility], [place] [address] [place], [place] [address]     RN Pressure Injury Documentation: Pressure Injury 10/25/21 Sacrum Medial Stage 3 -  Full thickness tissue loss. Subcutaneous fat may be visible but bone, tendon or muscle are NOT exposed. (Active)  10/25/21 1421  Location: Sacrum  Location Orientation: Medial  Staging: Stage 3 -  Full thickness tissue loss. Subcutaneous fat may be visible but bone, tendon or muscle are NOT exposed.  Wound Description (Comments):   Present on Admission: Yes     Estimated body mass index is 17.74 kg/m as calculated from the following:   Height as of 10/25/21: 5\' 2"  (1.575 m).   Weight as of 11/15/21: 44 kg.  Malnutrition Type:   Malnutrition Characteristics:   Nutrition Interventions:   Radiology Studies: DG Chest 1 View  Result Date: 11/18/2021 CLINICAL DATA:  Altered mental status. EXAM: CHEST  1 VIEW COMPARISON:  November 03, 2021 FINDINGS: The heart size and mediastinal contours are within normal limits. Mild increased pulmonary interstitium is identified bilaterally. The visualized skeletal structures are unremarkable. IMPRESSION: Mild increased pulmonary interstitium is identified bilaterally. Mild pulmonary edema versus viral infection. Electronically Signed   By: Abelardo Diesel M.D.   On: 11/18/2021 19:03   CT HEAD WO  CONTRAST (5MM)  Result Date: 11/18/2021 CLINICAL DATA:  Mental status changes.  Smoking crack at home. EXAM: CT HEAD WITHOUT CONTRAST TECHNIQUE: Contiguous axial images were obtained from the base of the skull through the vertex without intravenous contrast. RADIATION DOSE REDUCTION: This exam was performed according to the departmental dose-optimization program which includes automated exposure control, adjustment of the mA and/or kV according to patient size and/or use of iterative reconstruction technique. COMPARISON:  10/25/2021 FINDINGS: Brain: There is central and cortical atrophy. There is no intra or extra-axial fluid collection or mass lesion. The basilar cisterns and ventricles have a normal appearance. There is no CT evidence for acute infarction or hemorrhage. Vascular: There is  dense atherosclerotic calcification of the internal carotid arteries. No hyperdense vessels. Skull: Normal. Negative for fracture or focal lesion. Sinuses/Orbits: LEFT mastoid effusion, stable. Minimal opacification of the paranasal sinuses. Other: None. IMPRESSION: 1.  No evidence for acute intracranial abnormality. 2. Central and cortical atrophy. 3. LEFT mastoid effusion. Electronically Signed   By: Nolon Nations M.D.   On: 11/18/2021 18:55    Scheduled Meds:  chlorhexidine  15 mL Mouth Rinse BID   Chlorhexidine Gluconate Cloth  6 each Topical Daily   folic acid  1 mg Intravenous Daily   heparin  5,000 Units Subcutaneous Q8H   mouth rinse  15 mL Mouth Rinse q12n4p   melatonin  3 mg Oral QHS   pantoprazole (PROTONIX) IV  40 mg Intravenous Q24H   PHENObarbital  65 mg Intravenous TID   [START ON 11/21/2021] phenobarbital  32.4 mg Oral Q8H   [START ON 11/27/2021] thiamine injection  100 mg Intravenous Daily   Continuous Infusions:  dexmedetomidine (PRECEDEX) IV infusion 1.2 mcg/kg/hr (11/19/21 1200)   lactated ringers 75 mL/hr at 11/19/21 1340   thiamine injection     Followed by   [START ON 11/21/2021]  thiamine injection      LOS: 0 days   Kerney Elbe, DO [facility] PAGER is on AMION  If 7PM-7AM, please contact night-coverage [email or web address]

## 2021-11-20 ENCOUNTER — Inpatient Hospital Stay (HOSPITAL_COMMUNITY): Payer: Medicaid Other

## 2021-11-20 DIAGNOSIS — F1910 Other psychoactive substance abuse, uncomplicated: Secondary | ICD-10-CM

## 2021-11-20 DIAGNOSIS — K219 Gastro-esophageal reflux disease without esophagitis: Secondary | ICD-10-CM

## 2021-11-20 DIAGNOSIS — E870 Hyperosmolality and hypernatremia: Secondary | ICD-10-CM

## 2021-11-20 LAB — COMPREHENSIVE METABOLIC PANEL
ALT: 22 U/L (ref 0–44)
AST: 30 U/L (ref 15–41)
Albumin: 3 g/dL — ABNORMAL LOW (ref 3.5–5.0)
Alkaline Phosphatase: 59 U/L (ref 38–126)
Anion gap: 17 — ABNORMAL HIGH (ref 5–15)
BUN: 42 mg/dL — ABNORMAL HIGH (ref 6–20)
CO2: 16 mmol/L — ABNORMAL LOW (ref 22–32)
Calcium: 9.8 mg/dL (ref 8.9–10.3)
Chloride: 117 mmol/L — ABNORMAL HIGH (ref 98–111)
Creatinine, Ser: 1.6 mg/dL — ABNORMAL HIGH (ref 0.44–1.00)
GFR, Estimated: 38 mL/min — ABNORMAL LOW (ref >60)
Glucose, Bld: 92 mg/dL (ref 70–99)
Potassium: 3.6 mmol/L (ref 3.5–5.1)
Sodium: 150 mmol/L — ABNORMAL HIGH (ref 135–145)
Total Bilirubin: 0.8 mg/dL (ref 0.3–1.2)
Total Protein: 7.2 g/dL (ref 6.5–8.1)

## 2021-11-20 LAB — CBC WITH DIFFERENTIAL/PLATELET
Abs Immature Granulocytes: 0.03 10*3/uL (ref 0.00–0.07)
Basophils Absolute: 0 10*3/uL (ref 0.0–0.1)
Basophils Relative: 0 %
Eosinophils Absolute: 0.1 10*3/uL (ref 0.0–0.5)
Eosinophils Relative: 1 %
HCT: 30.3 % — ABNORMAL LOW (ref 36.0–46.0)
Hemoglobin: 9.4 g/dL — ABNORMAL LOW (ref 12.0–15.0)
Immature Granulocytes: 1 %
Lymphocytes Relative: 16 %
Lymphs Abs: 0.8 10*3/uL (ref 0.7–4.0)
MCH: 28.5 pg (ref 26.0–34.0)
MCHC: 31 g/dL (ref 30.0–36.0)
MCV: 91.8 fL (ref 80.0–100.0)
Monocytes Absolute: 0.7 10*3/uL (ref 0.1–1.0)
Monocytes Relative: 13 %
Neutro Abs: 3.6 10*3/uL (ref 1.7–7.7)
Neutrophils Relative %: 69 %
Platelets: 253 10*3/uL (ref 150–400)
RBC: 3.3 MIL/uL — ABNORMAL LOW (ref 3.87–5.11)
RDW: 17.2 % — ABNORMAL HIGH (ref 11.5–15.5)
WBC: 5.3 10*3/uL (ref 4.0–10.5)
nRBC: 0 % (ref 0.0–0.2)

## 2021-11-20 LAB — GLUCOSE, CAPILLARY
Glucose-Capillary: 81 mg/dL (ref 70–99)
Glucose-Capillary: 84 mg/dL (ref 70–99)
Glucose-Capillary: 97 mg/dL (ref 70–99)

## 2021-11-20 LAB — CALCIUM, IONIZED: Calcium, Ionized, Serum: 5.4 mg/dL (ref 4.5–5.6)

## 2021-11-20 LAB — CK: Total CK: 226 U/L (ref 38–234)

## 2021-11-20 LAB — BASIC METABOLIC PANEL
Anion gap: 11 (ref 5–15)
BUN: 39 mg/dL — ABNORMAL HIGH (ref 6–20)
CO2: 19 mmol/L — ABNORMAL LOW (ref 22–32)
Calcium: 9.7 mg/dL (ref 8.9–10.3)
Chloride: 112 mmol/L — ABNORMAL HIGH (ref 98–111)
Creatinine, Ser: 1.66 mg/dL — ABNORMAL HIGH (ref 0.44–1.00)
GFR, Estimated: 36 mL/min — ABNORMAL LOW (ref >60)
Glucose, Bld: 122 mg/dL — ABNORMAL HIGH (ref 70–99)
Potassium: 4.2 mmol/L (ref 3.5–5.1)
Sodium: 142 mmol/L (ref 135–145)

## 2021-11-20 LAB — MAGNESIUM: Magnesium: 1.9 mg/dL (ref 1.7–2.4)

## 2021-11-20 LAB — PHOSPHORUS: Phosphorus: 3.6 mg/dL (ref 2.5–4.6)

## 2021-11-20 LAB — RPR: RPR Ser Ql: NONREACTIVE

## 2021-11-20 MED ORDER — HYDROCORTISONE 1 % EX CREA
TOPICAL_CREAM | CUTANEOUS | Status: DC | PRN
  Administered 2021-11-22: 1 via TOPICAL
  Filled 2021-11-20 (×2): qty 28

## 2021-11-20 MED ORDER — POTASSIUM CHLORIDE 10 MEQ/100ML IV SOLN
10.0000 meq | INTRAVENOUS | Status: AC
  Administered 2021-11-20 (×2): 10 meq via INTRAVENOUS
  Filled 2021-11-20: qty 100

## 2021-11-20 MED ORDER — VALPROATE SODIUM 100 MG/ML IV SOLN
250.0000 mg | Freq: Two times a day (BID) | INTRAVENOUS | Status: DC
  Administered 2021-11-20 – 2021-11-23 (×7): 250 mg via INTRAVENOUS
  Filled 2021-11-20 (×8): qty 2.5

## 2021-11-20 MED ORDER — MAGNESIUM SULFATE 2 GM/50ML IV SOLN
2.0000 g | Freq: Once | INTRAVENOUS | Status: AC
  Administered 2021-11-20: 2 g via INTRAVENOUS
  Filled 2021-11-20: qty 50

## 2021-11-20 MED ORDER — NICOTINE 21 MG/24HR TD PT24
21.0000 mg | MEDICATED_PATCH | Freq: Every day | TRANSDERMAL | Status: DC | PRN
  Filled 2021-11-20 (×2): qty 1

## 2021-11-20 MED ORDER — DEXTROSE 5 % IV SOLN: INTRAVENOUS | Status: DC

## 2021-11-20 NOTE — Consult Note (Deleted)
[facility] Consult   Reason for Consult: Agitation, polysubstance abuse, bipolar Referring Physician: Dr. Alfredia Ferguson  Patient Identification: Sherry Baker MRN:  [id number] Principal Diagnosis: Acute metabolic encephalopathy Diagnosis:  Principal Problem:   Acute metabolic encephalopathy Active Problems:   Hypertension   GERD (gastroesophageal reflux disease)   Polysubstance abuse (HCC)   Hypernatremia   AKI (acute kidney injury) ([place])   Dehydration   Acute prerenal azotemia   Total Time spent with patient: 20 minutes  HPI:  Sherry Baker is a 56 y.o. female history of polysubstance abuse, cocaine abuse, alcohol abuse, 23, HTN, GAD, stage III kidney disease, chronic anemia admitted with altered mental status.  Patient was recently admitted from 10/24/21-11/16/21 for altered mental status and was recommended for temporary SNF but patient refused and was sent to home on 11/16/21.  Psych consult placed on 11/19/2021 due to agitation, polysubstance abuse and bipolar 1 by Dr. Alfredia Ferguson.   Assessment: Patient current presentation is consistent with acute encephalopathy which can be multifactorial due to electrolyte derangements in the setting of dehydration, infection.  Unlikely due to alcohol withdrawal as patient was recently discharged from hospital and was detoxed with Librium.  On exam, patient is not oriented.  Patient's oxygen concentration in the 60s and 70s when patient was agitated which might be contributing to patient's AMS.  UDS positive for cocaine, benzo and barbiturates (benzo and barbiturate likely due to medications given in the hospital).  Patient received Haldol, Versed, and Ativan due to agitation.  Now on Precedex.  Recommend treating medical issues  and holding psych medications. Can give as needed Haldol for agitation which should be followed by Ativan to reduce the risk of EPS.   ## Safety and Observation Level:  -Can continue four-point restraints as patient is  not oriented and high risk for fall and can pull IV lines.   ## Medications:  -Recommend treating medical issues including electrolyte derangements.  -Hold Celexa, buspirone -Continue Haldol 2 mg as needed every 6 hours for agitation.  Add 1 mg Ativan each time Haldol is given to reduce the risk of EPS -Can give melatonin 3 mg nightly at 6 PM if patient can swallow.  -Continue folic acid and thiamine  ## Medical Decision Making Capacity:  Not formally assessed.    ## Further Work-up:    -- most recent EKG on 11/19/2021 shows QTC 465.  -- Pertinent labwork reviewed during this admission includes: CMP (high Na 146, 149, 147), bicarbonate 19.9, Mg (>2),WBCs 3.2, hemoglobin 11.9, CK elevated 325, urinalysis negative for WBC, bacteria, RBCs.  COVID/influenza negative   ## Disposition:  -- unclear at this time  Subjective: Seen patient today in her room.  Patient screaming and shouting "mommy, mommy".  Patient is responding to internal stimuli.  Patient is altered and not able to answer any orientation questions.  Patient's eyes are closed.  Patient is not able to follow any commands.  Not able to test muscles for rigidity as patient was uncooperative.  During evaluation, the monitor showed patient's oxygen consistently in 60s and 70s when Pt was agitated with mildly high BP and high PR.  Later in the evaluation patient fell asleep and started snoring and her O2 saturation increased to 100%.   Spoke to the Pts' nurse who stated Pt's O2 saturation had been in 90's all Am.   Past Psychiatric History: History of polysubstance abuse, cocaine abuse, alcohol abuse, bipolar 1, GAD  Risk to Self:   Risk to Others:  Prior Inpatient Therapy:   Prior Outpatient Therapy:    Past Medical History:  Past Medical History:  Diagnosis Date   Alcohol abuse    Allergy    PCNS swelling   Arthritis    Bipolar 1 disorder ([place])    Cancer ([place]) 01/21/2017   rectal cancer   Cancer ([place])    Cirrhosis of  liver ([place])    Depression    Genetic testing 03/24/2017   Ms. Babson underwent genetic counseling and testing for hereditary cancer syndromes on 02/17/2017. Her results were negative for mutations in all 46 genes analyzed by [facility]'s 46-gene Common Hereditary Cancers Panel. Genes analyzed include: APC, ATM, AXIN2, BARD1, BMPR1A, BRCA1, BRCA2, BRIP1, CDH1, CDKN2A, CHEK2, CTNNA1, DICER1, EPCAM, GREM1, HOXB13, KIT, MEN1, MLH1, MSH2, MSH3, MSH6, MUTYH, NBN,   Hypertension     Past Surgical History:  Procedure Laterality Date   ABDOMINAL PERINEAL BOWEL RESECTION N/A 06/18/2017   Procedure: ABDOMINAL PERINEAL RESECTION ERAS PATHWAY;  Surgeon: Leighton Ruff, MD;  Location: [facility];  Service: General;  Laterality: N/A;   COLON SURGERY     COLONOSCOPY WITH PROPOFOL Left 01/21/2017   Procedure: COLONOSCOPY WITH PROPOFOL;  Surgeon: Otis Brace, MD;  Location: [facility];  Service: Gastroenterology;  Laterality: Left;   FRACTURE SURGERY     MANDIBLE FRACTURE SURGERY     Family History:  Family History  Problem Relation Age of Onset   Cancer Mother 51       Originating in the abdomen, otherwise unknwon   Cancer - Other Maternal Aunt        Throat   Family Psychiatric  History: None Social History:  Social History   Substance and Sexual Activity  Alcohol Use Yes   Comment: 07/28/2017 "40oz beer/day; if I have it"     Social History   Substance and Sexual Activity  Drug Use Yes   Types: "Crack" cocaine, Cocaine   Comment: 07/28/2017 "nothing in 3 wks"    Social History   Socioeconomic History   Marital status: Single    Spouse name: Not on file   Number of children: Not on file   Years of education: Not on file   Highest education level: Not on file  Occupational History   Not on file  Tobacco Use   Smoking status: Every Day    Packs/day: 0.50    Years: 39.00    Pack years: 19.50    Types: Cigarettes   Smokeless tobacco: Never  Vaping Use   Vaping Use: Never used   Substance and Sexual Activity   Alcohol use: Yes    Comment: 07/28/2017 "40oz beer/day; if I have it"   Drug use: Yes    Types: "Crack" cocaine, Cocaine    Comment: 07/28/2017 "nothing in 3 wks"   Sexual activity: Not Currently    Birth control/protection: Abstinence  Other Topics Concern   Not on file  Social History Narrative   ** Merged History Encounter **       Social Determinants of Health   Financial Resource Strain: Not on file  Food Insecurity: Not on file  Transportation Needs: Not on file  Physical Activity: Not on file  Stress: Not on file  Social Connections: Not on file   Additional Social History:    Allergies:  No Known Allergies  Labs:  Results for orders placed or performed during the hospital encounter of 11/18/21 (from the past 48 hour(s))  Resp Panel by RT-PCR (Flu A&B, Covid) Nasopharyngeal Swab  Status: None   Collection Time: 11/18/21  7:10 PM   Specimen: Nasopharyngeal Swab; Nasopharyngeal(NP) swabs in vial transport medium  Result Value Ref Range   SARS Coronavirus 2 by RT PCR NEGATIVE NEGATIVE    Comment: (NOTE) SARS-CoV-2 target nucleic acids are NOT DETECTED.  The SARS-CoV-2 RNA is generally detectable in upper respiratory specimens during the acute phase of infection. The lowest concentration of SARS-CoV-2 viral copies this assay can detect is 138 copies/mL. A negative result does not preclude SARS-Cov-2 infection and should not be used as the sole basis for treatment or other patient management decisions. A negative result may occur with  improper specimen collection/handling, submission of specimen other than nasopharyngeal swab, presence of viral mutation(s) within the areas targeted by this assay, and inadequate number of viral copies(<138 copies/mL). A negative result must be combined with clinical observations, patient history, and epidemiological information. The expected result is Negative.  Fact Sheet for Patients:   EntrepreneurPulse.com.au  Fact Sheet for Healthcare Providers:  IncredibleEmployment.be  This test is no t yet approved or cleared by the [place] FDA and  has been authorized for detection and/or diagnosis of SARS-CoV-2 by FDA under an Emergency Use Authorization (EUA). This EUA will remain  in effect (meaning this test can be used) for the duration of the COVID-19 declaration under Section 564(b)(1) of the Act, 21 U.S.C.section 360bbb-3(b)(1), unless the authorization is terminated  or revoked sooner.       Influenza A by PCR NEGATIVE NEGATIVE   Influenza B by PCR NEGATIVE NEGATIVE    Comment: (NOTE) The Xpert Xpress SARS-CoV-2/FLU/RSV plus assay is intended as an aid in the diagnosis of influenza from Nasopharyngeal swab specimens and should not be used as a sole basis for treatment. Nasal washings and aspirates are unacceptable for Xpert Xpress SARS-CoV-2/FLU/RSV testing.  Fact Sheet for Patients: EntrepreneurPulse.com.au  Fact Sheet for Healthcare Providers: IncredibleEmployment.be  This test is not yet approved or cleared by the [place] FDA and has been authorized for detection and/or diagnosis of SARS-CoV-2 by FDA under an Emergency Use Authorization (EUA). This EUA will remain in effect (meaning this test can be used) for the duration of the COVID-19 declaration under Section 564(b)(1) of the Act, 21 U.S.C. section 360bbb-3(b)(1), unless the authorization is terminated or revoked.  Performed at [facility], [place] [address] [place],  [address]   Comprehensive metabolic panel     Status: Abnormal   Collection Time: 11/18/21 10:35 PM  Result Value Ref Range   Sodium 147 (H) 135 - 145 mmol/L   Potassium 4.7 3.5 - 5.1 mmol/L   Chloride 117 (H) 98 - 111 mmol/L   CO2 20 (L) 22 - 32 mmol/L   Glucose, Bld 72 70 - 99 mg/dL    Comment: Glucose reference range applies only to  samples taken after fasting for at least 8 hours.   BUN 51 (H) 6 - 20 mg/dL   Creatinine, Ser 1.85 (H) 0.44 - 1.00 mg/dL   Calcium 9.4 8.9 - 10.3 mg/dL   Total Protein 7.3 6.5 - 8.1 g/dL   Albumin 3.0 (L) 3.5 - 5.0 g/dL   AST 26 15 - 41 U/L   ALT 19 0 - 44 U/L   Alkaline Phosphatase 58 38 - 126 U/L   Total Bilirubin 0.3 0.3 - 1.2 mg/dL   GFR, Estimated 32 (L) >60 mL/min    Comment: (NOTE) Calculated using the CKD-EPI Creatinine Equation (2021)    Anion gap 10 5 -  15    Comment: Performed at [facility], [place] [address] [place], [place] [address]  CBC with Differential     Status: Abnormal   Collection Time: 11/18/21 10:35 PM  Result Value Ref Range   WBC 5.0 4.0 - 10.5 K/uL   RBC 3.46 (L) 3.87 - 5.11 MIL/uL   Hemoglobin 9.9 (L) 12.0 - 15.0 g/dL   HCT 31.4 (L) 36.0 - 46.0 %   MCV 90.8 80.0 - 100.0 fL   MCH 28.6 26.0 - 34.0 pg   MCHC 31.5 30.0 - 36.0 g/dL   RDW 17.0 (H) 11.5 - 15.5 %   Platelets 289 150 - 400 K/uL   nRBC 0.0 0.0 - 0.2 %   Neutrophils Relative % 59 %   Neutro Abs 2.9 1.7 - 7.7 K/uL   Lymphocytes Relative 21 %   Lymphs Abs 1.1 0.7 - 4.0 K/uL   Monocytes Relative 14 %   Monocytes Absolute 0.7 0.1 - 1.0 K/uL   Eosinophils Relative 5 %   Eosinophils Absolute 0.2 0.0 - 0.5 K/uL   Basophils Relative 0 %   Basophils Absolute 0.0 0.0 - 0.1 K/uL   Immature Granulocytes 1 %   Abs Immature Granulocytes 0.06 0.00 - 0.07 K/uL    Comment: Performed at [facility] [address] [place], [place] [address]  Protime-INR     Status: None   Collection Time: 11/18/21 10:35 PM  Result Value Ref Range   Prothrombin Time 13.6 11.4 - 15.2 seconds   INR 1.0 0.8 - 1.2    Comment: (NOTE) INR goal varies based on device and disease states. Performed at [facility], [place] [address], [place], [place] [address]   Ethanol     Status: None   Collection Time: 11/18/21 10:37 PM  Result Value Ref Range   Alcohol, Ethyl (B) <10 <10 mg/dL    Comment:  (NOTE) Lowest detectable limit for serum alcohol is 10 mg/dL.  For medical purposes only. Performed at [facility], [place] [address] [place], [place] [address]   Ammonia     Status: None   Collection Time: 11/18/21 10:37 PM  Result Value Ref Range   Ammonia 20 9 - 35 umol/L    Comment: Performed at [facility], [place] [address], [place],  [address]  Urine rapid drug screen (hosp performed)     Status: Abnormal   Collection Time: 11/18/21 11:49 PM  Result Value Ref Range   Opiates NONE DETECTED NONE DETECTED   Cocaine POSITIVE (A) NONE DETECTED   Benzodiazepines POSITIVE (A) NONE DETECTED   Amphetamines NONE DETECTED NONE DETECTED   Tetrahydrocannabinol NONE DETECTED NONE DETECTED   Barbiturates POSITIVE (A) NONE DETECTED    Comment: (NOTE) DRUG SCREEN FOR MEDICAL PURPOSES ONLY.  IF CONFIRMATION IS NEEDED FOR ANY PURPOSE, NOTIFY LAB WITHIN 5 DAYS.  LOWEST DETECTABLE LIMITS FOR URINE DRUG SCREEN Drug Class                     Cutoff (ng/mL) Amphetamine and metabolites    1000 Barbiturate and metabolites    200 Benzodiazepine                 527 Tricyclics and metabolites     300 Opiates and metabolites        300 Cocaine and metabolites        300 THC  50 Performed at [facility], [place] [address], [place], [place] [address]   Urinalysis, Routine w reflex microscopic Urine, Catheterized     Status: Abnormal   Collection Time: 11/18/21 11:49 PM  Result Value Ref Range   Color, Urine YELLOW YELLOW   APPearance CLEAR CLEAR   Specific Gravity, Urine 1.025 1.005 - 1.030   pH 5.5 5.0 - 8.0   Glucose, UA NEGATIVE NEGATIVE mg/dL   Hgb urine dipstick NEGATIVE NEGATIVE   Bilirubin Urine NEGATIVE NEGATIVE   Ketones, ur NEGATIVE NEGATIVE mg/dL   Protein, ur 30 (A) NEGATIVE mg/dL   Nitrite NEGATIVE NEGATIVE   Leukocytes,Ua NEGATIVE NEGATIVE    Comment: Performed at [facility] [address] [place], [place] [address]   Urinalysis, Microscopic (reflex)     Status: None   Collection Time: 11/18/21 11:49 PM  Result Value Ref Range   RBC / HPF 0-5 0 - 5 RBC/hpf   WBC, UA 0-5 0 - 5 WBC/hpf   Bacteria, UA NONE SEEN NONE SEEN   Squamous Epithelial / LPF 0-5 0 - 5   Mucus PRESENT     Comment: Performed at [facility], [place] [address], [place], [place] [address]  Sodium, urine, random     Status: None   Collection Time: 11/18/21 11:49 PM  Result Value Ref Range   Sodium, Ur 109 mmol/L    Comment: Performed at [facility] [address] [place], [place] [address]  Protein / creatinine ratio, urine     Status: Abnormal   Collection Time: 11/18/21 11:49 PM  Result Value Ref Range   Creatinine, Urine 50.43 mg/dL   Total Protein, Urine 59 mg/dL    Comment: NO NORMAL RANGE ESTABLISHED FOR THIS TEST   Protein Creatinine Ratio 1.17 (H) 0.00 - 0.15 mg/mg[Cre]    Comment: Performed at [facility], [place] [address], [place], [place] [address]  Osmolality, urine     Status: None   Collection Time: 11/18/21 11:49 PM  Result Value Ref Range   Osmolality, Ur 498 300 - 900 mOsm/kg    Comment: Performed at [facility] [address], [place], [place] [address]  Creatinine, urine, random     Status: None   Collection Time: 11/18/21 11:49 PM  Result Value Ref Range   Creatinine, Urine 50.38 mg/dL    Comment: Performed at [facility], [place] [address], [place],  [address]  I-Stat venous blood gas, ED     Status: Abnormal   Collection Time: 11/19/21  3:33 AM  Result Value Ref Range   pH, Ven 7.359 7.250 - 7.430   pCO2, Ven 35.3 (L) 44.0 - 60.0 mmHg   pO2, Ven 35.0 32.0 - 45.0 mmHg   Bicarbonate 19.9 (L) 20.0 - 28.0 mmol/L   TCO2 21 (L) 22 - 32 mmol/L   O2 Saturation 66.0 %   Acid-base deficit 5.0 (H) 0.0 - 2.0 mmol/L   Sodium 149 (H) 135 - 145 mmol/L   Potassium 3.9 3.5 - 5.1 mmol/L   Calcium, Ion 1.25 1.15 - 1.40 mmol/L   HCT 35.0 (L) 36.0 - 46.0 %   Hemoglobin 11.9 (L)  12.0 - 15.0 g/dL   Sample type VENOUS    Comment NOTIFIED PHYSICIAN   Magnesium     Status: None   Collection Time: 11/19/21  3:36 AM  Result Value Ref Range   Magnesium 2.2 1.7 - 2.4 mg/dL  Comment: Performed at [facility], [place] [address] [place], [place] [address]  Comprehensive metabolic panel     Status: Abnormal   Collection Time: 11/19/21  3:36 AM  Result Value Ref Range   Sodium 146 (H) 135 - 145 mmol/L   Potassium 3.8 3.5 - 5.1 mmol/L   Chloride 116 (H) 98 - 111 mmol/L   CO2 16 (L) 22 - 32 mmol/L   Glucose, Bld 73 70 - 99 mg/dL    Comment: Glucose reference range applies only to samples taken after fasting for at least 8 hours.   BUN 47 (H) 6 - 20 mg/dL   Creatinine, Ser 1.63 (H) 0.44 - 1.00 mg/dL   Calcium 9.6 8.9 - 10.3 mg/dL   Total Protein 7.6 6.5 - 8.1 g/dL   Albumin 3.0 (L) 3.5 - 5.0 g/dL   AST 31 15 - 41 U/L   ALT 21 0 - 44 U/L   Alkaline Phosphatase 66 38 - 126 U/L   Total Bilirubin 0.4 0.3 - 1.2 mg/dL   GFR, Estimated 37 (L) >60 mL/min    Comment: (NOTE) Calculated using the CKD-EPI Creatinine Equation (2021)    Anion gap 14 5 - 15    Comment: Performed at [facility], [place] [address] [place], [place] [address]  CBC with Differential/Platelet     Status: Abnormal   Collection Time: 11/19/21  3:36 AM  Result Value Ref Range   WBC 3.2 (L) 4.0 - 10.5 K/uL   RBC 4.38 3.87 - 5.11 MIL/uL   Hemoglobin 12.4 12.0 - 15.0 g/dL    Comment: REPEATED TO VERIFY   HCT 41.3 36.0 - 46.0 %   MCV 94.3 80.0 - 100.0 fL   MCH 28.3 26.0 - 34.0 pg   MCHC 30.0 30.0 - 36.0 g/dL   RDW 17.2 (H) 11.5 - 15.5 %   Platelets 257 150 - 400 K/uL   nRBC 0.0 0.0 - 0.2 %   Neutrophils Relative % 55 %   Neutro Abs 1.8 1.7 - 7.7 K/uL   Lymphocytes Relative 23 %   Lymphs Abs 0.7 0.7 - 4.0 K/uL   Monocytes Relative 15 %   Monocytes Absolute 0.5 0.1 - 1.0 K/uL   Eosinophils Relative 5 %   Eosinophils Absolute 0.2 0.0 - 0.5 K/uL   Basophils Relative 0 %   Basophils  Absolute 0.0 0.0 - 0.1 K/uL   Immature Granulocytes 2 %   Abs Immature Granulocytes 0.05 0.00 - 0.07 K/uL    Comment: Performed at [facility], [place] [address] [place], [place] [address]  Calcium, ionized     Status: None   Collection Time: 11/19/21  3:36 AM  Result Value Ref Range   Calcium, Ionized, Serum 5.4 4.5 - 5.6 mg/dL    Comment: (NOTE) Performed At: [facility] [place], [place] [id number] Rush Farmer MD [id number]   Protime-INR     Status: None   Collection Time: 11/19/21  3:36 AM  Result Value Ref Range   Prothrombin Time 13.5 11.4 - 15.2 seconds   INR 1.0 0.8 - 1.2    Comment: (NOTE) INR goal varies based on device and disease states. Performed at [facility], [place] [address], [place], [place] [address]   CK     Status: Abnormal   Collection Time: 11/19/21  3:38 AM  Result Value Ref Range   Total CK 325 (H) 38 - 234 U/L    Comment:  Performed at [facility], [place] [address], [place], [place] [address]  Glucose, capillary     Status: None   Collection Time: 11/19/21 11:57 AM  Result Value Ref Range   Glucose-Capillary 73 70 - 99 mg/dL    Comment: Glucose reference range applies only to samples taken after fasting for at least 8 hours.  Blood gas, venous     Status: Abnormal   Collection Time: 11/19/21 12:17 PM  Result Value Ref Range   pH, Ven 7.330 7.250 - 7.430   pCO2, Ven 33.3 (L) 44.0 - 60.0 mmHg   pO2, Ven 41.2 32.0 - 45.0 mmHg   Bicarbonate 17.2 (L) 20.0 - 28.0 mmol/L   Acid-base deficit 7.7 (H) 0.0 - 2.0 mmol/L   O2 Saturation 71.3 %   Patient temperature 36.5    Drawn by [id number]    Sample type VENOUS     Comment: Performed at [facility] [address], [place], [place] [address]  TSH     Status: None   Collection Time: 11/19/21 12:17 PM  Result Value Ref Range   TSH 3.777 0.350 - 4.500 uIU/mL    Comment: Performed by a 3rd Generation assay with a functional sensitivity of <=0.01  uIU/mL. Performed at [facility], [place] [address], [place], [place] [address]   Lithium level     Status: Abnormal   Collection Time: 11/19/21 12:17 PM  Result Value Ref Range   Lithium Lvl <0.06 (L) 0.60 - 1.20 mmol/L    Comment: Performed at [facility] [address] [place], [place] [address]  Osmolality     Status: Abnormal   Collection Time: 11/19/21 12:17 PM  Result Value Ref Range   Osmolality 327 (HH) 275 - 295 mOsm/kg    Comment: REPEATED TO VERIFY CRITICAL RESULT CALLED TO, READ BACK BY AND VERIFIED WITH: Smiley Houseman RN [id number] 11/19/2021 BY R VERAAR Performed at [facility], [place] [address] [place], [place] [address]   Vitamin B12     Status: None   Collection Time: 11/19/21 12:17 PM  Result Value Ref Range   Vitamin B-12 525 180 - 914 pg/mL    Comment: (NOTE) This assay is not validated for testing neonatal or myeloproliferative syndrome specimens for Vitamin B12 levels. Performed at [facility], [place] [address], [place], [place] [address]   Lactic acid, plasma     Status: None   Collection Time: 11/19/21 12:17 PM  Result Value Ref Range   Lactic Acid, Venous 1.5 0.5 - 1.9 mmol/L    Comment: Performed at [facility] [address] [place], [place] [address]  Basic metabolic panel     Status: Abnormal   Collection Time: 11/19/21 12:17 PM  Result Value Ref Range   Sodium 149 (H) 135 - 145 mmol/L   Potassium 4.1 3.5 - 5.1 mmol/L   Chloride 117 (H) 98 - 111 mmol/L   CO2 18 (L) 22 - 32 mmol/L   Glucose, Bld 78 70 - 99 mg/dL    Comment: Glucose reference range applies only to samples taken after fasting for at least 8 hours.   BUN 47 (H) 6 - 20 mg/dL   Creatinine, Ser 1.87 (H) 0.44 - 1.00 mg/dL   Calcium 9.5 8.9 - 10.3 mg/dL   GFR, Estimated 32 (L) >60 mL/min    Comment: (NOTE) Calculated using the CKD-EPI Creatinine Equation (2021)    Anion gap 14 5 -  15    Comment: Performed at [facility], [place] [address],  [place], [place] [address]  Glucose, capillary     Status: None   Collection Time: 11/19/21  3:35 PM  Result Value Ref Range   Glucose-Capillary 83 70 - 99 mg/dL    Comment: Glucose reference range applies only to samples taken after fasting for at least 8 hours.  Glucose, capillary     Status: None   Collection Time: 11/19/21  8:03 PM  Result Value Ref Range   Glucose-Capillary 90 70 - 99 mg/dL    Comment: Glucose reference range applies only to samples taken after fasting for at least 8 hours.  Glucose, capillary     Status: None   Collection Time: 11/19/21 11:10 PM  Result Value Ref Range   Glucose-Capillary 84 70 - 99 mg/dL    Comment: Glucose reference range applies only to samples taken after fasting for at least 8 hours.  Comprehensive metabolic panel     Status: Abnormal   Collection Time: 11/20/21  1:42 AM  Result Value Ref Range   Sodium 150 (H) 135 - 145 mmol/L   Potassium 3.6 3.5 - 5.1 mmol/L   Chloride 117 (H) 98 - 111 mmol/L   CO2 16 (L) 22 - 32 mmol/L   Glucose, Bld 92 70 - 99 mg/dL    Comment: Glucose reference range applies only to samples taken after fasting for at least 8 hours.   BUN 42 (H) 6 - 20 mg/dL   Creatinine, Ser 1.60 (H) 0.44 - 1.00 mg/dL   Calcium 9.8 8.9 - 10.3 mg/dL   Total Protein 7.2 6.5 - 8.1 g/dL   Albumin 3.0 (L) 3.5 - 5.0 g/dL   AST 30 15 - 41 U/L   ALT 22 0 - 44 U/L   Alkaline Phosphatase 59 38 - 126 U/L   Total Bilirubin 0.8 0.3 - 1.2 mg/dL   GFR, Estimated 38 (L) >60 mL/min    Comment: (NOTE) Calculated using the CKD-EPI Creatinine Equation (2021)    Anion gap 17 (H) 5 - 15    Comment: Performed at [facility], [place] [address], [place], [place] [address]  CBC with Differential/Platelet     Status: Abnormal   Collection Time: 11/20/21  1:42 AM  Result Value Ref Range   WBC 5.3 4.0 - 10.5 K/uL   RBC 3.30 (L) 3.87 - 5.11 MIL/uL   Hemoglobin 9.4 (L) 12.0 - 15.0 g/dL   HCT 30.3 (L) 36.0 - 46.0 %   MCV 91.8 80.0 - 100.0 fL   MCH 28.5  26.0 - 34.0 pg   MCHC 31.0 30.0 - 36.0 g/dL   RDW 17.2 (H) 11.5 - 15.5 %   Platelets 253 150 - 400 K/uL   nRBC 0.0 0.0 - 0.2 %   Neutrophils Relative % 69 %   Neutro Abs 3.6 1.7 - 7.7 K/uL   Lymphocytes Relative 16 %   Lymphs Abs 0.8 0.7 - 4.0 K/uL   Monocytes Relative 13 %   Monocytes Absolute 0.7 0.1 - 1.0 K/uL   Eosinophils Relative 1 %   Eosinophils Absolute 0.1 0.0 - 0.5 K/uL   Basophils Relative 0 %   Basophils Absolute 0.0 0.0 - 0.1 K/uL   Immature Granulocytes 1 %   Abs Immature Granulocytes 0.03 0.00 - 0.07 K/uL    Comment: Performed at [facility] [address] [place], [place] [address]  CK  Status: None   Collection Time: 11/20/21  1:42 AM  Result Value Ref Range   Total CK 226 38 - 234 U/L    Comment: Performed at [facility], [place] [address], [place], [place] [address]  Magnesium     Status: None   Collection Time: 11/20/21  1:42 AM  Result Value Ref Range   Magnesium 1.9 1.7 - 2.4 mg/dL    Comment: Performed at [facility] [address], [place], [place] [address]  Phosphorus     Status: None   Collection Time: 11/20/21  1:42 AM  Result Value Ref Range   Phosphorus 3.6 2.5 - 4.6 mg/dL    Comment: Performed at [facility] [address] [place], [place] [address]  Glucose, capillary     Status: None   Collection Time: 11/20/21  7:42 AM  Result Value Ref Range   Glucose-Capillary 84 70 - 99 mg/dL    Comment: Glucose reference range applies only to samples taken after fasting for at least 8 hours.    Current Facility-Administered Medications  Medication Dose Route Frequency Provider Last Rate Last Admin   acetaminophen (TYLENOL) tablet 650 mg  650 mg Oral Q6H PRN Howerter, Justin B, DO       Or   acetaminophen (TYLENOL) suppository 650 mg  650 mg Rectal Q6H PRN Howerter, Justin B, DO       chlorhexidine (PERIDEX) 0.12 % solution 15 mL  15 mL Mouth Rinse BID Spero Geralds, MD       Chlorhexidine Gluconate Cloth 2 % PADS 6  each  6 each Topical Daily Spero Geralds, MD   6 each at 11/19/21 1224   dexmedetomidine (PRECEDEX) 400 MCG/100ML (4 mcg/mL) infusion  0.4-1.2 mcg/kg/hr Intravenous Titrated Raiford Noble Wildrose, DO 11 mL/hr at 11/20/21 0800 1 mcg/kg/hr at 11/20/21 0800   dextrose 5 % solution   Intravenous Continuous Spero Geralds, MD       folic acid injection 1 mg  1 mg Intravenous Daily Desai, Rahul P, PA-C   1 mg at 11/19/21 1539   heparin injection 5,000 Units  5,000 Units Subcutaneous Q8H Spero Geralds, MD   5,000 Units at 11/20/21 0533   hydrALAZINE (APRESOLINE) injection 5-10 mg  5-10 mg Intravenous Q4H PRN Frederik Pear, MD   5 mg at 11/20/21 0947   hydrocortisone cream 1 %   Topical PRN Spero Geralds, MD       LORazepam (ATIVAN) injection 0.5 mg  0.5 mg Intravenous Q6H PRN Magdalen Spatz, NP   0.5 mg at 11/20/21 0151   MEDLINE mouth rinse  15 mL Mouth Rinse q12n4p Spero Geralds, MD       melatonin tablet 3 mg  3 mg Oral QHS Rogerio Boutelle, MD       nicotine (NICODERM CQ - dosed in mg/24 hours) patch 21 mg  21 mg Transdermal Daily PRN Spero Geralds, MD       pantoprazole (PROTONIX) injection 40 mg  40 mg Intravenous Q24H Howerter, Justin B, DO   40 mg at 11/19/21 0918   PHENObarbital (LUMINAL) injection 65 mg  65 mg Intravenous TID Magdalen Spatz, NP   65 mg at 11/19/21 2137   [START ON 11/21/2021] PHENobarbital (LUMINAL) tablet 32.4 mg  32.4 mg Oral Q8H Magdalen Spatz, NP       thiamine 563m in normal saline (546m IVPB  500 mg Intravenous Q8H Kirby-Graham, KaKarsten Fells  NP 100 mL/hr at 11/20/21 0641 Rate Change at 11/20/21 0641   Followed by   Derrill Memo ON 11/21/2021] thiamine (B-1) 250 mg in sodium chloride 0.9 % 50 mL IVPB  250 mg Intravenous Daily Kirby-Graham, Karsten Fells, NP       Followed by   Derrill Memo ON 11/27/2021] thiamine (B-1) injection 100 mg  100 mg Intravenous Daily Kirby-Graham, Karsten Fells, NP       valproate (DEPACON) 250 mg in dextrose 5 % 50 mL IVPB  250 mg Intravenous Q12H Spero Geralds, MD        Musculoskeletal: Strength & Muscle Tone:  Not able to test, Pt uncooperative, moving all 4 limbs Gait & Station:  Deferred Patient leans: N/A            Psychiatric Specialty Exam: Full mental status exam cannot be completed due to AMS.   Presentation  General Appearance: Disheveled (in 4 point restrains)  Eye Contact:None  Speech:Pressured  Speech Volume:Increased  Handedness:Right   Mood and Affect  Mood:Depressed; Anxious  Affect: no data recorded   Thought Process  Thought Processes: not able to assess due to AMS Descriptions of Associations:Tangential  Orientation:None  Thought Content: Not able to assess due to AMS  History of Schizophrenia/Schizoaffective disorder:No data recorded Duration of Psychotic Symptoms:No data recorded Hallucinations:No data recorded Ideas of Reference:None  Suicidal Thoughts:No data recorded Homicidal Thoughts:No data recorded  Sensorium  Memory:Not able to assess due to AMS  Judgment:Impaired  Insight:Not able to assess due to AMS   Executive Functions  Concentration:Poor  Attention Span:Poor  [place] of Knowledge:Poor  Language:Poor   Psychomotor Activity  Psychomotor Activity:Psychomotor Activity: Increased; Restlessness   Assets  Assets:   Sleep  Sleep:No data recorded  Physical Exam: Physical Exam Vitals and nursing note reviewed.  Constitutional:      General: She is in acute distress.     Appearance: She is ill-appearing.  Neurological:     Mental Status: She is disoriented.   Review of Systems  Reason unable to perform ROS: Due to AMS.  Blood pressure 138/77, pulse 71, temperature 97.6 F (36.4 C), temperature source Oral, resp. rate 19, weight 44.3 kg, SpO2 100 %. Body mass index is 17.86 kg/m.   Armando Reichert, MD 11/20/2021 8:55 AM

## 2021-11-20 NOTE — Progress Notes (Signed)
[place] Progress Note Patient Name: Sherry Baker DOB: May 01, 1966 MRN: [id number]   Date of Service  11/20/2021  HPI/Events of Note  Bedside RN reporting QTC > 500, MAR reviewed by me and only significant  QT prolonging medication on the [facility] is Protonix.  eICU Interventions  Replete electrolytes and avoid QT prolonging medications, check magnesium  level.        Kerry Kass Jalal Rauch 11/20/2021, 3:07 AM

## 2021-11-20 NOTE — Progress Notes (Signed)
NAME:  Sherry Baker, MRN:  [id number], DOB:  Jan 06, 1966, LOS: 1 ADMISSION DATE:  11/18/2021, CONSULTATION DATE: 11/20/2021 REFERRING MD:  Velia Meyer, TRH, CHIEF COMPLAINT: AMS  History of Present Illness:  Sherry Baker is a 56 y.o. female current every day smoker with medical history significant for polysubstance abuse, including cocaine use as well as alcohol abuse, type I bipolar disorder, central hypertension, generalized anxiety disorder, stage IIIb chronic kidney disease with associated baseline creatinine 1.3-1.6, chronic anemia with baseline hemoglobin 10-12, who is admitted to [facility] on 11/18/2021 with acute metabolic encephalopathy after presenting from home to [facility] ED for evaluation of altered mental status.   Recent admission to [facility] from 10/24/2021 -11/16/2021 initially for altered mental status this felt to be on the basis of her polysubstance abuse, including ensuing development of alcohol withdrawal for which she was started on Precedex drip, ultimately aspirated, developed acute hypoxic respiratory failure, with transferred into the ICU.  Temporary SNF placement for temporary rehab in the context of generalized deconditioning given a 3-week course in the hospital was recommended to the patient.  However, the patient ultimately refused this recommendation and was subsequent discharged home on 11/16/2021.   Patient was brought to the ED via EMS 1/16 when family found her again with AMS , confused and agitated.  EMS administered  Haldol 5 mg IM x1, Versed 5 mg IV ED x1 in route to [facility] .    In the ED, Afebrile; heart rate 7778; blood pressure 133/74 - 158/85; respiratory rate 16, oxygen saturation 97 to 98% on room air.    Labs were notable for the following: CMP notable for the following: Sodium 147 compared to most recent prior value of 139 on 11/16/2021, chloride 117, bicarbonate 20, anion gap 10, creatinine 1.85 relative to most recent prior value of 1.63 on 11/16/2021, BUN  to creatinine ratio 27.6, glucose 72, calcium corrected for mild hypoalbuminemia 10.2, albumin 3.0, otherwise, liver enzymes are within normal months.  Ammonia 20.  CBC notable for will Patel, 5000, hemoglobin 9.9, associated normocytic/normochromic findings.  INR 1.0.  Serum ethanol less than 10.  Urinary drug screen positive for cocaine as well as benzodiazepines (within the context of having received Versed in route to the ED this evening); urinalysis notable for demonstrating no white blood cells, no bacteria, 30 protein, no hemoglobin, no RBCs, and specific gravity 1.025.  COVID-19/influenza PCR [place] negative.   She was admitted by [facility] for further evaluation management of presenting suspected acute metabolic encephalopathy complicated by AKI on stage IIIb CKD as well as dehydration associated with hypernatremia.   She has remained agitated since 9 pm 1/16. She has received multiple doses of Ativan and Haldol with no change in her agitation and mental status. [facility] have ordered precedex, therefore patient will be admitted to the ICU and  PCCM will manage care.   Pertinent  Medical History   Past Medical History:  Diagnosis Date   Alcohol abuse    Allergy    PCNS swelling   Arthritis    Bipolar 1 disorder ([place])    Cancer ([place]) 01/21/2017   rectal cancer   Cancer ([place])    Cirrhosis of liver ([place])    Depression    Genetic testing 03/24/2017   Ms. Vanderford underwent genetic counseling and testing for hereditary cancer syndromes on 02/17/2017. Her results were negative for mutations in all 46 genes analyzed by [facility]'s 46-gene Common Hereditary Cancers Panel. Genes analyzed include: APC, ATM, AXIN2,  BARD1, BMPR1A, BRCA1, BRCA2, BRIP1, CDH1, CDKN2A, CHEK2, CTNNA1, DICER1, EPCAM, GREM1, HOXB13, KIT, MEN1, MLH1, MSH2, MSH3, MSH6, MUTYH, NBN,   Hypertension     Significant Hospital Events: Including procedures, antibiotic start and stop dates in addition to other pertinent events    10/24/2021-11/16/2021 Admission  11/17/2021 Readmission after cocaine use   Interim History / Subjective:  Patient agitated overnight requiring as needed Ativan QTC elevated to 500 overnight, QTC prolonging meds continued. Patient slightly agitated this morning but answering questions appropriately Endorse some back pain, itching and dry mouth  Objective   Blood pressure (!) 139/120, pulse 72, temperature 97.6 F (36.4 C), temperature source Oral, resp. rate (!) 23, weight 44.3 kg, SpO2 98 %.        Intake/Output Summary (Last 24 hours) at 11/20/2021 0746 Last data filed at 11/20/2021 0615 Gross per 24 hour  Intake 1458.03 ml  Output 675 ml  Net 783.03 ml   Filed Weights   11/20/21 0452  Weight: 44.3 kg    Examination: General: Cachectic middle-aged woman.  NAD. HENT: Dry mucous membrane. Lungs: Clear to auscultation bilaterally.  No wheezing or rales. Cardiovascular: RRR.  No murmurs rubs or gallops. Abdomen: Soft.  NT/ND.  Normal BS.  Ostomy bag full with hard stool Extremities: Well perfused. Skin: Multiple erythematous and pruritic welts and papules on back.  Neuro: Awake and oriented.  RASS +2 mildly agitated.  Moves all extremities.   Resolved Hospital Problem list   N/A  Assessment & Plan:  Acute metabolic encephalopathy Agitated overnight requiring as needed Ativan.  Mental status started to improve this morning. Holding off QTC prolonging medications due to QTC 500 overnight.  Neurological deficits on exam --Neuro signing off --Start Depakote --Continue on Precedex, wean as able  Polysubstance use (EtOH and cocaine) Concern for EtOH withdrawal Intermittently agitated.  RASS +2 this morning.  Reports increased thirst and back pain. --Continue phenobarbital --Continue prn Ativan  AKI on CKD 3B Anion gap metabolic acidosis Hypovolemic hypernatremia AKI slowly improving with IV fluid resuscitation.  Sodium slightly up trended to 150 from 149.  Free water  deficit of 1.6 L.  We will start D5 LR and monitor. --Start D5 LR at 100 cc/h --Avoid nephrotoxic's agent --Check afternoon BMP  Generalized anxiety disorder --Psych following, plan to sign off today --Continue Precedex, ativan --Continue melatonin at bedtime --Continue to hold home Celexa and BuSpar  Rash Patient with a history of bedbugs found to have multiple pruritic or erythematous welts and papules on her back.  Likely bedbugs but will also consider drug reaction on the differential. -- Start hydrocortisone cream --CTM  HTN Intermittently hypertensive with SBP in the 40s to 180s when agitated. --Continue prn hydralazine for now --Start oral hypertensives when able  History of tobacco abuse --Nicotine patch 21 mg  Best Practice (right click and "Reselect all SmartList Selections" daily)   Diet/type: Regular consistency (see orders) DVT prophylaxis: SCD GI prophylaxis: N/A Lines: N/A Foley:  N/A Code Status:  full code Last date of multidisciplinary goals of care discussion [no family at bedside]  Labs   CBC: Recent Labs  Lab 11/18/21 2235 11/19/21 0333 11/19/21 0336 11/20/21 0142  WBC 5.0  --  3.2* 5.3  NEUTROABS 2.9  --  1.8 3.6  HGB 9.9* 11.9* 12.4 9.4*  HCT 31.4* 35.0* 41.3 30.3*  MCV 90.8  --  94.3 91.8  PLT 289  --  257 017    Basic Metabolic Panel: Recent Labs  Lab 11/16/21 0109  2235 11/19/21 °0333 11/19/21 °0336 11/19/21 °1217 11/20/21 °0142  °NA 139 147* 149* 146* 149* 150*  °K 3.6 4.7 3.9 3.8 4.1 3.6  °CL 106 117*  --  116* 117* 117*  °CO2 24 20*  --  16* 18* 16*  °GLUCOSE 83 72  --  73 78 92  °BUN 55* 51*  --  47* 47* 42*  °CREATININE 1.63* 1.85*  --  1.63* 1.87* 1.60*  °CALCIUM 9.0 9.4  --  9.6 9.5 9.8  °MG  --   --   --  2.2  --  1.9  °PHOS  --   --   --   --   --  3.6  ° °GFR: °Estimated Creatinine Clearance: 28.1 mL/min (A) (by C-G formula based on SCr of 1.6 mg/dL (H)). °Recent Labs  °Lab 11/18/21 °2235 11/19/21 °0336 11/19/21 °1217  11/20/21 °0142  °WBC 5.0 3.2*  --  5.3  °LATICACIDVEN  --   --  1.5  --   ° ° °Liver Function Tests: °Recent Labs  °Lab 11/18/21 °2235 11/19/21 °0336 11/20/21 °0142  °AST 26 31 30  °ALT 19 21 22  °ALKPHOS 58 66 59  °BILITOT 0.3 0.4 0.8  °PROT 7.3 7.6 7.2  °ALBUMIN 3.0* 3.0* 3.0*  ° °No results for input(s): LIPASE, AMYLASE in the last 168 hours. °Recent Labs  °Lab 11/18/21 °2237  °AMMONIA 20  ° ° °ABG °   °Component Value Date/Time  ° PHART 7.351 09/11/2021 0910  ° PCO2ART 53.7 (H) 09/11/2021 0910  ° PO2ART 104 09/11/2021 0910  ° HCO3 17.2 (L) 11/19/2021 1217  ° TCO2 21 (L) 11/19/2021 0333  ° ACIDBASEDEF 7.7 (H) 11/19/2021 1217  ° O2SAT 71.3 11/19/2021 1217  °  ° °Coagulation Profile: °Recent Labs  °Lab 11/18/21 °2235 11/19/21 °0336  °INR 1.0 1.0  ° ° °Cardiac Enzymes: °Recent Labs  °Lab 11/19/21 °0338 11/20/21 °0142  °CKTOTAL 325* 226  ° ° °HbA1C: °Hgb A1c MFr Bld  °Date/Time Value Ref Range Status  °10/29/2021 07:27 AM 5.2 4.8 - 5.6 % Final  °  Comment:  °  (NOTE) °Pre diabetes:          5.7%-6.4% ° °Diabetes:              >6.4% ° °Glycemic control for   <7.0% °adults with diabetes °  ° ° °CBG: °Recent Labs  °Lab 11/19/21 °1157 11/19/21 °1535 11/19/21 °2003 11/19/21 °2310 11/20/21 °0742  °GLUCAP 73 83 90 84 84  ° ° °Critical care time: 30 °  ° ° ° ° ° °

## 2021-11-20 NOTE — Progress Notes (Signed)
E-link contacted and made aware of patients Qtc maintaining greater than 500.

## 2021-11-20 NOTE — Consult Note (Signed)
[facility] psychiatry consult follow-up   Patient was seen briefly in the ICU.  Patient sleeping comfortably.  Qtc 525 on the monitor. Per nursing staff patient was awake this morning and asked for water and stated that she was at [place] was stopped by primary team due to prolonged QTc. -Agree with Depakote. -Continue recommendation from initial consult note. -Psychiatry will sign off.   Dr Leeanne Mannan [facility]

## 2021-11-21 LAB — BASIC METABOLIC PANEL
Anion gap: 7 (ref 5–15)
BUN: 34 mg/dL — ABNORMAL HIGH (ref 6–20)
CO2: 21 mmol/L — ABNORMAL LOW (ref 22–32)
Calcium: 9.6 mg/dL (ref 8.9–10.3)
Chloride: 113 mmol/L — ABNORMAL HIGH (ref 98–111)
Creatinine, Ser: 1.69 mg/dL — ABNORMAL HIGH (ref 0.44–1.00)
GFR, Estimated: 36 mL/min — ABNORMAL LOW (ref >60)
Glucose, Bld: 108 mg/dL — ABNORMAL HIGH (ref 70–99)
Potassium: 3.5 mmol/L (ref 3.5–5.1)
Sodium: 141 mmol/L (ref 135–145)

## 2021-11-21 LAB — MAGNESIUM: Magnesium: 2 mg/dL (ref 1.7–2.4)

## 2021-11-21 LAB — GLUCOSE, CAPILLARY
Glucose-Capillary: 101 mg/dL — ABNORMAL HIGH (ref 70–99)
Glucose-Capillary: 78 mg/dL (ref 70–99)
Glucose-Capillary: 81 mg/dL (ref 70–99)
Glucose-Capillary: 86 mg/dL (ref 70–99)
Glucose-Capillary: 93 mg/dL (ref 70–99)
Glucose-Capillary: 97 mg/dL (ref 70–99)

## 2021-11-21 LAB — METHYLMALONIC ACID, SERUM: Methylmalonic Acid, Quantitative: 339 nmol/L (ref 0–378)

## 2021-11-21 MED ORDER — POTASSIUM CHLORIDE 20 MEQ PO PACK
20.0000 meq | PACK | Freq: Once | ORAL | Status: AC
  Administered 2021-11-21: 20 meq via ORAL
  Filled 2021-11-21: qty 1

## 2021-11-21 MED ORDER — LACTULOSE 10 GM/15ML PO SOLN: 20.0000 g | ORAL | Status: DC

## 2021-11-21 MED ORDER — ADULT MULTIVITAMIN W/MINERALS CH
1.0000 | ORAL_TABLET | Freq: Every day | ORAL | Status: DC
  Administered 2021-11-21 – 2021-11-23 (×3): 1 via ORAL
  Filled 2021-11-21 (×3): qty 1

## 2021-11-21 MED ORDER — POTASSIUM CHLORIDE CRYS ER 20 MEQ PO TBCR: 20.0000 meq | EXTENDED_RELEASE_TABLET | Freq: Once | ORAL | Status: DC

## 2021-11-21 MED ORDER — PHENOBARBITAL 32.4 MG PO TABS
32.4000 mg | ORAL_TABLET | Freq: Three times a day (TID) | ORAL | Status: DC
  Administered 2021-11-23 (×2): 32.4 mg via ORAL
  Filled 2021-11-21 (×2): qty 1

## 2021-11-21 MED ORDER — ACETAMINOPHEN 500 MG PO TABS
1000.0000 mg | ORAL_TABLET | Freq: Once | ORAL | Status: AC
  Administered 2021-11-21: 1000 mg via ORAL
  Filled 2021-11-21: qty 2

## 2021-11-21 MED ORDER — ENSURE ENLIVE PO LIQD
237.0000 mL | Freq: Three times a day (TID) | ORAL | Status: DC
  Administered 2021-11-21 – 2021-11-23 (×7): 237 mL via ORAL

## 2021-11-21 MED ORDER — CARVEDILOL 3.125 MG PO TABS
3.1250 mg | ORAL_TABLET | Freq: Two times a day (BID) | ORAL | Status: DC
  Administered 2021-11-21 – 2021-11-22 (×3): 3.125 mg via ORAL
  Filled 2021-11-21 (×5): qty 1

## 2021-11-21 MED ORDER — BUSPIRONE HCL 15 MG PO TABS
15.0000 mg | ORAL_TABLET | Freq: Two times a day (BID) | ORAL | Status: DC
  Administered 2021-11-21 – 2021-11-23 (×5): 15 mg via ORAL
  Filled 2021-11-21 (×5): qty 1

## 2021-11-21 MED ORDER — SENNOSIDES-DOCUSATE SODIUM 8.6-50 MG PO TABS
1.0000 | ORAL_TABLET | Freq: Two times a day (BID) | ORAL | Status: DC
  Administered 2021-11-21 – 2021-11-23 (×3): 1 via ORAL
  Filled 2021-11-21 (×3): qty 1

## 2021-11-21 MED ORDER — LIDOCAINE 5 % EX PTCH
1.0000 | MEDICATED_PATCH | CUTANEOUS | Status: DC
  Administered 2021-11-21 – 2021-11-23 (×2): 1 via TRANSDERMAL
  Filled 2021-11-21 (×3): qty 1

## 2021-11-21 NOTE — Progress Notes (Signed)
Initial Nutrition Assessment  DOCUMENTATION CODES:   Severe malnutrition in context of social or environmental circumstances  INTERVENTION:   Ensure Enlive po TID, each supplement provides 350 kcal and 20 grams of protein MVI with minerals daily  NUTRITION DIAGNOSIS:   Severe Malnutrition related to social / environmental circumstances (polysubstance abuse) as evidenced by severe muscle depletion, severe fat depletion.  GOAL:   Patient will meet greater than or equal to 90% of their needs  MONITOR:   PO intake, Supplement acceptance, Labs, Skin  REASON FOR ASSESSMENT:   Rounds (Poor PO intake)    ASSESSMENT:   56 yo female admitted with AMS. PMH includes polysubstance abuse, cocaine use, alcohol abuse, Bipolar disorder, HTN, GAD, CKD stage IIIB, chronic anemia, liver cirrhosis, rectal cancer 2018.  Discussed patient in ICU rounds and with RN today. Patient was just discharged from the hospital on 1/14 (admitted for AMS r/t polysubstance abuse, alcohol withdrawal, respiratory failure). She refused SNF placement and was discharged home.  Patient was identified to have severe malnutrition on recent hospitalization; this is ongoing. She required Cortrak tube placement for enteral nutrition supplementation as intake was inadequate on dysphagia 2 diet with thin liquids. Currently on a regular diet. Intake of meals has been minimal per RN.  She is off Precedex today.   Patient says she has been eating well. She loves chocolate Ensure supplements. She c/o back pain. RN notified.   Labs reviewed.  CBG: [phone]  Medications reviewed and include folic acid, phenobarbital, thiamine.   Stool output 100 ml via colostomy x 24 hours.  Weight history reviewed. Weight seems to fluctuate. Overall stable x 1 year.    NUTRITION - FOCUSED PHYSICAL EXAM:  Flowsheet Row Most Recent Value  Orbital Region Mild depletion  Upper Arm Region Severe depletion  Thoracic and Lumbar  Region Severe depletion  Buccal Region Mild depletion  Temple Region Moderate depletion  Clavicle Bone Region Moderate depletion  Clavicle and Acromion Bone Region Moderate depletion  Scapular Bone Region Severe depletion  Dorsal Hand Moderate depletion  Patellar Region Severe depletion  Anterior Thigh Region Severe depletion  Posterior Calf Region Severe depletion  Edema (RD Assessment) None  Hair Reviewed  Eyes Reviewed  Mouth Reviewed  Skin Reviewed  Nails Reviewed       Diet Order:   Diet Order             Diet regular Room service appropriate? Yes; Fluid consistency: Thin  Diet effective now                   EDUCATION NEEDS:   No education needs have been identified at this time  Skin:  Skin Assessment: Skin Integrity Issues: Skin Integrity Issues:: Stage III Stage III: sacrum  Last BM:  1/18 colostomy  Height:   Ht Readings from Last 1 Encounters:  10/25/21 5\' 2"  (1.575 m)    Weight:   Wt Readings from Last 1 Encounters:  11/21/21 46.2 kg    BMI:  Body mass index is 18.63 kg/m.  Estimated Nutritional Needs:   Kcal:  1800-2000  Protein:  75-85 gm  Fluid:  >/= 1.8 L    Lucas Mallow RD, LDN, CNSC Please refer to Amion for contact information.

## 2021-11-21 NOTE — TOC Progression Note (Signed)
Transition of Care [facility]) - Progression Note    Patient Details  Name: Sherry Baker MRN: [id number] Date of Birth: 1966-01-26  Transition of Care [facility]) CM/SW [place], RN Phone Number:[phone]  11/21/2021, 12:53 PM  Clinical Narrative:    [facility] acknowledges consult for cage aid for substance abuse. Patient is unable to participate in assessment at this time. TOC will continue to follow.         Expected Discharge Plan and Services                                                 Social Determinants of Health (SDOH) Interventions    Readmission Risk Interventions Readmission Risk Prevention Plan 11/15/2021 09/30/2021 09/24/2021  Transportation Screening Complete Complete Complete  Medication Review Press photographer) Complete Complete Complete  PCP or Specialist appointment within 3-5 days of discharge Complete Complete Complete  HRI or Home Care Consult - Not Complete Not Complete  HRI or Home Care Consult Pt Refusal Comments - - history of unsafe home environment  SW Recovery Care/Counseling Consult - Complete Complete  Palliative Care Screening Not Applicable Not Applicable Not Applicable  Skilled Nursing Facility Not Applicable Not Applicable Complete  Some recent data might be hidden

## 2021-11-21 NOTE — Progress Notes (Signed)
NAME:  Sherry Baker, MRN:  [id number], DOB:  [birth year], LOS: 2 ADMISSION DATE:  11/18/2021, CONSULTATION DATE: 11/20/2021 REFERRING MD:  Velia Meyer, TRH, CHIEF COMPLAINT: AMS  History of Present Illness:  Sherry Baker is a 56 y.o. female current every day smoker with medical history significant for polysubstance abuse, including cocaine use as well as alcohol abuse, type I bipolar disorder, central hypertension, generalized anxiety disorder, stage IIIb chronic kidney disease with associated baseline creatinine 1.3-1.6, chronic anemia with baseline hemoglobin 10-12, who is admitted to [facility] on 11/18/2021 with acute metabolic encephalopathy after presenting from home to [facility] ED for evaluation of altered mental status.   Recent admission to [facility] from 10/24/2021 -11/16/2021 initially for altered mental status this felt to be on the basis of her polysubstance abuse, including ensuing development of alcohol withdrawal for which she was started on Precedex drip, ultimately aspirated, developed acute hypoxic respiratory failure, with transferred into the ICU.  Temporary SNF placement for temporary rehab in the context of generalized deconditioning given a 3-week course in the hospital was recommended to the patient.  However, the patient ultimately refused this recommendation and was subsequent discharged home on 11/16/2021.   Patient was brought to the ED via EMS 1/16 when family found her again with AMS , confused and agitated.  EMS administered  Haldol 5 mg IM x1, Versed 5 mg IV ED x1 in route to [facility] .    In the ED, Afebrile; heart rate 7778; blood pressure 133/74 - 158/85; respiratory rate 16, oxygen saturation 97 to 98% on room air.    Labs were notable for the following: CMP notable for the following: Sodium 147 compared to most recent prior value of 139 on 11/16/2021, chloride 117, bicarbonate 20, anion gap 10, creatinine 1.85 relative to most recent prior value of 1.63 on 11/16/2021, BUN  to creatinine ratio 27.6, glucose 72, calcium corrected for mild hypoalbuminemia 10.2, albumin 3.0, otherwise, liver enzymes are within normal months.  Ammonia 20.  CBC notable for will Patel, 5000, hemoglobin 9.9, associated normocytic/normochromic findings.  INR 1.0.  Serum ethanol less than 10.  Urinary drug screen positive for cocaine as well as benzodiazepines (within the context of having received Versed in route to the ED this evening); urinalysis notable for demonstrating no white blood cells, no bacteria, 30 protein, no hemoglobin, no RBCs, and specific gravity 1.025.  COVID-19/influenza PCR [place] negative.   She was admitted by [facility] for further evaluation management of presenting suspected acute metabolic encephalopathy complicated by AKI on stage IIIb CKD as well as dehydration associated with hypernatremia.   She has remained agitated since 9 pm 1/16. She has received multiple doses of Ativan and Haldol with no change in her agitation and mental status. [facility] have ordered precedex, therefore patient will be admitted to the ICU and  PCCM will manage care.   Pertinent  Medical History   Past Medical History:  Diagnosis Date   Alcohol abuse    Allergy    PCNS swelling   Arthritis    Bipolar 1 disorder ([place])    Cancer ([place]) 01/21/2017   rectal cancer   Cancer ([place])    Cirrhosis of liver ([place])    Depression    Genetic testing 03/24/2017   Ms. Brunkow underwent genetic counseling and testing for hereditary cancer syndromes on 02/17/2017. Her results were negative for mutations in all 46 genes analyzed by [facility]'s 46-gene Common Hereditary Cancers Panel. Genes analyzed include: APC, ATM, AXIN2, BARD1,  BMPR1A, BRCA1, BRCA2, BRIP1, CDH1, CDKN2A, CHEK2, CTNNA1, DICER1, EPCAM, GREM1, HOXB13, KIT, MEN1, MLH1, MSH2, MSH3, MSH6, MUTYH, NBN,   Hypertension     Significant Hospital Events: Including procedures, antibiotic start and stop dates in addition to other pertinent events    10/24/2021-11/16/2021 Admission  11/17/2021 Readmission after cocaine use   Interim History / Subjective:  No acute events overnight Did not need any as needed Ativan's overnight Alert but with some confusion this morning.  Objective   Blood pressure 135/85, pulse (!) 57, temperature (!) 97.4 F (36.3 C), temperature source Oral, resp. rate 19, weight 46.2 kg, SpO2 100 %.        Intake/Output Summary (Last 24 hours) at 11/21/2021 0732 Last data filed at 11/21/2021 0400 Gross per 24 hour  Intake 1996.9 ml  Output 100 ml  Net 1896.9 ml   Filed Weights   11/20/21 0452 11/21/21 0500  Weight: 44.3 kg 46.2 kg    Examination: General: Cachectic middle-aged woman.  NAD. HENT: Dry mucous membrane. Lungs: Clear to auscultation bilaterally.  No wheezing or rales. Cardiovascular: RRR.  No murmurs rubs or gallops. Abdomen: Soft.  NT/ND.  Normal BS.  Ostomy bag full with hard stool Extremities: Well perfused. Skin: Multiple erythematous and pruritic welts and papules on back.  Neuro: Alert. Oriented to place and self.  RASS 0.  Moves all extremities.  Labs reviewed: CBGs 81, 97, 101, 86 Creatinine 1.69, sodium 141, bicarb 21  Resolved Hospital Problem list   N/A  Assessment & Plan:  Acute metabolic encephalopathy No acute events overnight.  Mental status slowly improving but still not at baseline.  We will start weaning off Precedex and start clonidine taper.  --Continue Depakote and phenobarbital --Wean off Precedex, start clonidine  Polysubstance use (EtOH and cocaine) No signs of withdrawal.  Hemodynamically stable.  Reporting some back pain. --Continue phenobarbital as above with prn ativan --Continue folic acid, thiamine --Start lidocaine patch  AKI on CKD 3B Anion gap metabolic acidosis Hypovolemic hypernatremia Creatinine remained stable at bicarb slightly improving.  Anion gap resolved.  Sodium within normal limits at 141.  --Continue D5 LR at 50 cc/h --Avoid  nephrotoxic's agent  Generalized anxiety disorder Not needing as much as needed Ativan.  We will start home meds as mental status improves. --Continue melatonin at bedtime --Continue to hold home Celexa and BuSpar  Rash Patient with a history of bedbugs found to have multiple pruritic or erythematous welts and papules on her back.  Likely bedbugs but will also consider drug reaction on the differential.  Rash seems to be improving. -- Continue hydrocortisone cream --CTM  HTN SBP in the 120s to 150s overnight.  Slightly up this morning with SBP in the 150s to 160s.  We will restart home Coreg as tolerated by heart rate. --Continue prn hydralazine for now  History of tobacco abuse --Nicotine patch 21 mg  Best Practice (right click and "Reselect all SmartList Selections" daily)   Diet/type: Regular consistency (see orders) DVT prophylaxis: SCD GI prophylaxis: N/A Lines: N/A Foley:  N/A Code Status:  full code Last date of multidisciplinary goals of care discussion [no family at bedside]  Labs   CBC: Recent Labs  Lab 11/18/21 2235 11/19/21 0333 11/19/21 0336 11/20/21 0142  WBC 5.0  --  3.2* 5.3  NEUTROABS 2.9  --  1.8 3.6  HGB 9.9* 11.9* 12.4 9.4*  HCT 31.4* 35.0* 41.3 30.3*  MCV 90.8  --  94.3 91.8  PLT 289  --  257  680    Basic Metabolic Panel: Recent Labs  Lab 11/19/21 0336 11/19/21 1217 11/20/21 0142 11/20/21 1301 11/21/21 0131  NA 146* 149* 150* 142 141  K 3.8 4.1 3.6 4.2 3.5  CL 116* 117* 117* 112* 113*  CO2 16* 18* 16* 19* 21*  GLUCOSE 73 78 92 122* 108*  BUN 47* 47* 42* 39* 34*  CREATININE 1.63* 1.87* 1.60* 1.66* 1.69*  CALCIUM 9.6 9.5 9.8 9.7 9.6  MG 2.2  --  1.9  --   --   PHOS  --   --  3.6  --   --    GFR: Estimated Creatinine Clearance: 27.8 mL/min (A) (by C-G formula based on SCr of 1.69 mg/dL (H)). Recent Labs  Lab 11/18/21 2235 11/19/21 0336 11/19/21 1217 11/20/21 0142  WBC 5.0 3.2*  --  5.3  LATICACIDVEN  --   --  1.5  --      Liver Function Tests: Recent Labs  Lab 11/18/21 2235 11/19/21 0336 11/20/21 0142  AST '26 31 30  ' ALT '19 21 22  ' ALKPHOS 58 66 59  BILITOT 0.3 0.4 0.8  PROT 7.3 7.6 7.2  ALBUMIN 3.0* 3.0* 3.0*   No results for input(s): LIPASE, AMYLASE in the last 168 hours. Recent Labs  Lab 11/18/21 2237  AMMONIA 20    ABG    Component Value Date/Time   PHART 7.351 09/11/2021 0910   PCO2ART 53.7 (H) 09/11/2021 0910   PO2ART 104 09/11/2021 0910   HCO3 17.2 (L) 11/19/2021 1217   TCO2 21 (L) 11/19/2021 0333   ACIDBASEDEF 7.7 (H) 11/19/2021 1217   O2SAT 71.3 11/19/2021 1217     Coagulation Profile: Recent Labs  Lab 11/18/21 2235 11/19/21 0336  INR 1.0 1.0    Cardiac Enzymes: Recent Labs  Lab 11/19/21 0338 11/20/21 0142  CKTOTAL 325* 226    HbA1C: Hgb A1c MFr Bld  Date/Time Value Ref Range Status  10/29/2021 07:27 AM 5.2 4.8 - 5.6 % Final    Comment:    (NOTE) Pre diabetes:          5.7%-6.4%  Diabetes:              >6.4%  Glycemic control for   <7.0% adults with diabetes     CBG: Recent Labs  Lab 11/19/21 2310 11/20/21 0742 11/20/21 2003 11/20/21 2345 11/21/21 0347  GLUCAP 84 84 81 97 101*    Critical care time: 30

## 2021-11-22 LAB — GLUCOSE, CAPILLARY
Glucose-Capillary: 102 mg/dL — ABNORMAL HIGH (ref 70–99)
Glucose-Capillary: 83 mg/dL (ref 70–99)
Glucose-Capillary: 118 mg/dL — ABNORMAL HIGH (ref 70–99)
Glucose-Capillary: 104 mg/dL — ABNORMAL HIGH (ref 70–99)

## 2021-11-22 LAB — BASIC METABOLIC PANEL
Anion gap: 6 (ref 5–15)
BUN: 28 mg/dL — ABNORMAL HIGH (ref 6–20)
CO2: 20 mmol/L — ABNORMAL LOW (ref 22–32)
Calcium: 8.2 mg/dL — ABNORMAL LOW (ref 8.9–10.3)
Chloride: 109 mmol/L (ref 98–111)
Creatinine, Ser: 1.73 mg/dL — ABNORMAL HIGH (ref 0.44–1.00)
GFR, Estimated: 35 mL/min — ABNORMAL LOW (ref >60)
Glucose, Bld: 107 mg/dL — ABNORMAL HIGH (ref 70–99)
Potassium: 3.6 mmol/L (ref 3.5–5.1)
Sodium: 135 mmol/L (ref 135–145)

## 2021-11-22 LAB — MAGNESIUM: Magnesium: 1.7 mg/dL (ref 1.7–2.4)

## 2021-11-22 MED ORDER — ACETAMINOPHEN 10 MG/ML IV SOLN
1000.0000 mg | Freq: Once | INTRAVENOUS | Status: AC
  Administered 2021-11-22: 1000 mg via INTRAVENOUS
  Filled 2021-11-22: qty 100

## 2021-11-22 MED ORDER — LABETALOL HCL 5 MG/ML IV SOLN: 10.0000 mg | INTRAVENOUS | Status: DC | PRN

## 2021-11-22 MED ORDER — AMLODIPINE BESYLATE 10 MG PO TABS
10.0000 mg | ORAL_TABLET | Freq: Every day | ORAL | Status: DC
  Administered 2021-11-22: 10 mg via ORAL
  Filled 2021-11-22: qty 1

## 2021-11-22 MED ORDER — POTASSIUM CHLORIDE 20 MEQ PO PACK
20.0000 meq | PACK | Freq: Once | ORAL | Status: AC
  Administered 2021-11-22: 20 meq via ORAL
  Filled 2021-11-22: qty 1

## 2021-11-22 MED ORDER — ACETAMINOPHEN 500 MG PO TABS
1000.0000 mg | ORAL_TABLET | Freq: Three times a day (TID) | ORAL | Status: DC
  Administered 2021-11-22 – 2021-11-23 (×4): 1000 mg via ORAL
  Filled 2021-11-22 (×4): qty 2

## 2021-11-22 NOTE — Progress Notes (Signed)
NAME:  Sherry Baker, MRN:  [id number], DOB:  08-06-1966, LOS: 3 ADMISSION DATE:  11/18/2021, CONSULTATION DATE: 11/20/2021 REFERRING MD:  Velia Meyer, TRH, CHIEF COMPLAINT: AMS  History of Present Illness:  Sherry Baker is a 56 y.o. female current every day smoker with medical history significant for polysubstance abuse, including cocaine use as well as alcohol abuse, type I bipolar disorder, central hypertension, generalized anxiety disorder, stage IIIb chronic kidney disease with associated baseline creatinine 1.3-1.6, chronic anemia with baseline hemoglobin 10-12, who is admitted to [facility] on 11/18/2021 with acute metabolic encephalopathy after presenting from home to [facility] ED for evaluation of altered mental status.   Recent admission to [facility] from 10/24/2021 -11/16/2021 initially for altered mental status this felt to be on the basis of her polysubstance abuse, including ensuing development of alcohol withdrawal for which she was started on Precedex drip, ultimately aspirated, developed acute hypoxic respiratory failure, with transferred into the ICU.  Temporary SNF placement for temporary rehab in the context of generalized deconditioning given a 3-week course in the hospital was recommended to the patient.  However, the patient ultimately refused this recommendation and was subsequent discharged home on 11/16/2021.   Patient was brought to the ED via EMS 1/16 when family found her again with AMS , confused and agitated.  EMS administered  Haldol 5 mg IM x1, Versed 5 mg IV ED x1 in route to [facility] .    In the ED, Afebrile; heart rate 7778; blood pressure 133/74 - 158/85; respiratory rate 16, oxygen saturation 97 to 98% on room air.    Labs were notable for the following: CMP notable for the following: Sodium 147 compared to most recent prior value of 139 on 11/16/2021, chloride 117, bicarbonate 20, anion gap 10, creatinine 1.85 relative to most recent prior value of 1.63 on 11/16/2021, BUN  to creatinine ratio 27.6, glucose 72, calcium corrected for mild hypoalbuminemia 10.2, albumin 3.0, otherwise, liver enzymes are within normal months.  Ammonia 20.  CBC notable for will Patel, 5000, hemoglobin 9.9, associated normocytic/normochromic findings.  INR 1.0.  Serum ethanol less than 10.  Urinary drug screen positive for cocaine as well as benzodiazepines (within the context of having received Versed in route to the ED this evening); urinalysis notable for demonstrating no white blood cells, no bacteria, 30 protein, no hemoglobin, no RBCs, and specific gravity 1.025.  COVID-19/influenza PCR [place] negative.   She was admitted by [facility] for further evaluation management of presenting suspected acute metabolic encephalopathy complicated by AKI on stage IIIb CKD as well as dehydration associated with hypernatremia.   She has remained agitated since 9 pm 1/16. She has received multiple doses of Ativan and Haldol with no change in her agitation and mental status. [facility] have ordered precedex, therefore patient will be admitted to the ICU and  PCCM will manage care.   Pertinent  Medical History   Past Medical History:  Diagnosis Date   Alcohol abuse    Allergy    PCNS swelling   Arthritis    Bipolar 1 disorder ([place])    Cancer ([place]) 01/21/2017   rectal cancer   Cancer ([place])    Cirrhosis of liver ([place])    Depression    Genetic testing 03/24/2017   Ms. Ravan underwent genetic counseling and testing for hereditary cancer syndromes on 02/17/2017. Her results were negative for mutations in all 46 genes analyzed by [facility]'s 46-gene Common Hereditary Cancers Panel. Genes analyzed include: APC, ATM, AXIN2, BARD1,  BMPR1A, BRCA1, BRCA2, BRIP1, CDH1, CDKN2A, CHEK2, CTNNA1, DICER1, EPCAM, GREM1, HOXB13, KIT, MEN1, MLH1, MSH2, MSH3, MSH6, MUTYH, NBN,   Hypertension     Significant Hospital Events: Including procedures, antibiotic start and stop dates in addition to other pertinent events    10/24/2021-11/16/2021 Admission  11/17/2021 Readmission after cocaine use   Interim History / Subjective:  Reported some back pain overnight  Back is still hurting this morning Tolerating her breakfast Hallucinations have resolved  Objective   Blood pressure (!) 141/93, pulse 98, temperature 98.5 F (36.9 C), temperature source Oral, resp. rate 16, weight 46.2 kg, SpO2 97 %.        Intake/Output Summary (Last 24 hours) at 11/22/2021 0739 Last data filed at 11/22/2021 0630 Gross per 24 hour  Intake 1648.71 ml  Output 1725 ml  Net -76.29 ml   Filed Weights   11/21/21 0500 11/22/21 0000 11/22/21 0429  Weight: 46.2 kg 46.2 kg 46.2 kg    Examination: General: Cachectic middle-aged woman.  NAD. HENT: Dry mucous membrane. Lungs: Clear to auscultation bilaterally.  No wheezing or rales. Cardiovascular: RRR.  No murmurs rubs or gallops. Abdomen: Soft.  NT/ND.  Normal BS.  Ostomy bag full with hard stool Extremities: Well perfused. Skin: Multiple erythematous and pruritic welts and papules on back now w/ vesicles Neuro: Alert. Oriented to place and self.  Moves all extremities.  Labs reviewed: CBGs: 81,97,102,83 Na 135, K+3.6, bicarb 20, sCr 1.73  Resolved Hospital Problem list   Hypovolemic hypernatremia  Assessment & Plan:  Acute metabolic encephalopathy Mental status at baseline. Still occasional agitation but alert and oriented x3.  No prn Ativan in the last 48 hours. --Transfer to floor --Continue Depakote --Phenobarbital taper starting tomorrow  Polysubstance use (EtOH and cocaine) Reports ongoing back pain. --Continue phenobarbital as above with prn ativan --Start scheduled Tylenol 1000 mg TID --Continue folic acid, thiamine --Continue lidocaine patch  AKI on CKD 3B Anion gap metabolic acidosis, improved Kidney function relatively stable with slight increase in creatinine to 1.73.  Slightly dry on exam. --Continue D5 LR at 50 cc/h --Avoid nephrotoxic's  agent  Generalized anxiety disorder Occasional agitation but relatively stable. --Continue BuSpar --Continue melatonin at bedtime -- Start home Celexa in the next few days  Rash Patient with a history of bedbugs found to have multiple pruritic or erythematous welts and papules on her back.  Likely bedbugs but will also consider drug reaction on the differential.  Rash now with some developing vesicles on top of the welts.  --Continue hydrocortisone cream --CTM --Contact precautions  HTN SBP still in the 130s to 170s overnight.  --Start home amlodipine 10 mg daily --Continue Coreg 3.125 mg BID --Continue prn hydralazine for now  Tachycardia Patient continues to be intermittently tachycardic.  Denies any SOB or chest pain. --Continue home Coreg as above, can increase to improve HR  History of tobacco abuse --Nicotine patch 21 mg  Best Practice (right click and "Reselect all SmartList Selections" daily)   Diet/type: Regular consistency (see orders) DVT prophylaxis: SCD GI prophylaxis: N/A Lines: N/A Foley:  N/A Code Status:  full code Last date of multidisciplinary goals of care discussion [no family at bedside]  Labs   CBC: Recent Labs  Lab 11/18/21 2235 11/19/21 0333 11/19/21 0336 11/20/21 0142  WBC 5.0  --  3.2* 5.3  NEUTROABS 2.9  --  1.8 3.6  HGB 9.9* 11.9* 12.4 9.4*  HCT 31.4* 35.0* 41.3 30.3*  MCV 90.8  --  94.3 91.8  PLT 289  --  257 673    Basic Metabolic Panel: Recent Labs  Lab 11/19/21 0336 11/19/21 1217 11/20/21 0142 11/20/21 1301 11/21/21 0131 11/22/21 0340  NA 146* 149* 150* 142 141 135  K 3.8 4.1 3.6 4.2 3.5 3.6  CL 116* 117* 117* 112* 113* 109  CO2 16* 18* 16* 19* 21* 20*  GLUCOSE 73 78 92 122* 108* 107*  BUN 47* 47* 42* 39* 34* 28*  CREATININE 1.63* 1.87* 1.60* 1.66* 1.69* 1.73*  CALCIUM 9.6 9.5 9.8 9.7 9.6 8.2*  MG 2.2  --  1.9  --  2.0  --   PHOS  --   --  3.6  --   --   --    GFR: Estimated Creatinine Clearance: 27.1 mL/min  (A) (by C-G formula based on SCr of 1.73 mg/dL (H)). Recent Labs  Lab 11/18/21 2235 11/19/21 0336 11/19/21 1217 11/20/21 0142  WBC 5.0 3.2*  --  5.3  LATICACIDVEN  --   --  1.5  --     Liver Function Tests: Recent Labs  Lab 11/18/21 2235 11/19/21 0336 11/20/21 0142  AST _0 ALT _1 ALKPHOS 58 66 59  BILITOT 0.3 0.4 0.8  PROT 7.3 7.6 7.2  ALBUMIN 3.0* 3.0* 3.0*   No results for input(s): LIPASE, AMYLASE in the last 168 hours. Recent Labs  Lab 11/18/21 2237  AMMONIA 20    ABG    Component Value Date/Time   PHART 7.351 09/11/2021 0910   PCO2ART 53.7 (H) 09/11/2021 0910   PO2ART 104 09/11/2021 0910   HCO3 17.2 (L) 11/19/2021 1217   TCO2 21 (L) 11/19/2021 0333   ACIDBASEDEF 7.7 (H) 11/19/2021 1217   O2SAT 71.3 11/19/2021 1217     Coagulation Profile: Recent Labs  Lab 11/18/21 2235 11/19/21 0336  INR 1.0 1.0    Cardiac Enzymes: Recent Labs  Lab 11/19/21 0338 11/20/21 0142  CKTOTAL 325* 226    HbA1C: Hgb A1c MFr Bld  Date/Time Value Ref Range Status  10/29/2021 07:27 AM 5.2 4.8 - 5.6 % Final    Comment:    (NOTE) Pre diabetes:          5.7%-6.4%  Diabetes:              >6.4%  Glycemic control for   <7.0% adults with diabetes     CBG: Recent Labs  Lab 11/21/21 1226 11/21/21 1733 11/21/21 1944 11/21/21 2347 11/22/21 0353  GLUCAP 78 93 81 97 102*    Critical care time: 30

## 2021-11-22 NOTE — Progress Notes (Incomplete)
Attending note: I have seen and examined the patient. History, labs and imaging reviewed.    Blood pressure (!) 141/93, pulse (!) 128, temperature 99.2 F (37.3 C), temperature source Oral, resp. rate 16, weight 46.2 kg, SpO2 97 %. Gen:      No acute distress HEENT:  EOMI, sclera anicteric Neck:     No masses; no thyromegaly Lungs:    Clear to auscultation bilaterally; normal respiratory effort CV:         Regular rate and rhythm; no murmurs Abd:      + bowel sounds; soft, non-tender; no palpable masses, no distension Ext:    No edema; adequate peripheral perfusion Skin:      Warm and dry; no rash Neuro: alert and oriented x 3 Psych: normal mood and affect   Labs/Imaging personally reviewed, significant for   Assessment/plan: Acute metabolic encephalopathy Off precedex Taper phenobarb this weekend Depakote per psychiatry PRN ativan  HTN On coreg. Start amlodipine  Stable for transfer out of ICU  The patient is critically ill with multiple organ systems failure and requires high complexity decision making for assessment and support, frequent evaluation and titration of therapies, application of advanced monitoring technologies and extensive interpretation of multiple databases.  Critical care time - 35 mins. This represents my time independent of the NPs time taking care of the pt.  Marshell Garfinkel MD [facility] 11/22/2021, 9:40 AM

## 2021-11-23 DIAGNOSIS — L080 Pyoderma: Secondary | ICD-10-CM

## 2021-11-23 LAB — BASIC METABOLIC PANEL
Anion gap: 11 (ref 5–15)
BUN: 26 mg/dL — ABNORMAL HIGH (ref 6–20)
CO2: 19 mmol/L — ABNORMAL LOW (ref 22–32)
Calcium: 9 mg/dL (ref 8.9–10.3)
Chloride: 107 mmol/L (ref 98–111)
Creatinine, Ser: 1.62 mg/dL — ABNORMAL HIGH (ref 0.44–1.00)
GFR, Estimated: 38 mL/min — ABNORMAL LOW (ref >60)
Glucose, Bld: 106 mg/dL — ABNORMAL HIGH (ref 70–99)
Potassium: 4 mmol/L (ref 3.5–5.1)
Sodium: 137 mmol/L (ref 135–145)

## 2021-11-23 MED ORDER — VALPROIC ACID 250 MG PO CAPS
250.0000 mg | ORAL_CAPSULE | Freq: Two times a day (BID) | ORAL | Status: DC
  Filled 2021-11-23: qty 1

## 2021-11-23 MED ORDER — CARVEDILOL 12.5 MG PO TABS
6.2500 mg | ORAL_TABLET | Freq: Two times a day (BID) | ORAL | Status: DC
  Administered 2021-11-23: 6.25 mg via ORAL
  Filled 2021-11-23: qty 1

## 2021-11-23 MED ORDER — TRAMADOL HCL 50 MG PO TABS: 50.0000 mg | ORAL_TABLET | Freq: Four times a day (QID) | ORAL | Status: DC | PRN

## 2021-11-23 MED ORDER — FOLIC ACID 1 MG PO TABS
1.0000 mg | ORAL_TABLET | Freq: Every day | ORAL | Status: DC
  Administered 2021-11-23: 1 mg via ORAL
  Filled 2021-11-23: qty 1

## 2021-11-23 MED ORDER — AMLODIPINE BESYLATE 5 MG PO TABS
5.0000 mg | ORAL_TABLET | Freq: Every day | ORAL | Status: DC
  Administered 2021-11-23: 5 mg via ORAL
  Filled 2021-11-23: qty 1

## 2021-11-23 NOTE — Assessment & Plan Note (Signed)
Resolved

## 2021-11-23 NOTE — Assessment & Plan Note (Signed)
Sinus tachycardia  Noted to have tachycardia even during previous hospitalization.  Dose of carvedilol has been increased.  She is on amlodipine which is being continued.  Continue to monitor closely.

## 2021-11-23 NOTE — Progress Notes (Signed)
[facility] PROGRESS NOTE   [name] BEGIN LEX:[id number] DOB: [birth year] DOA: 11/18/2021  4 DOS: the patient was seen and examined on 11/23/2021  PCP: Pcp, No  Brief History and Hospital Course:  56 y.o. female current every day smoker with medical history significant for polysubstance abuse, including cocaine use as well as alcohol abuse, type I bipolar disorder, central hypertension, generalized anxiety disorder, stage IIIb chronic kidney disease with associated baseline creatinine 1.3-1.6, chronic anemia with baseline hemoglobin 10-12, who was admitted to [facility] on 11/18/2021 with acute metabolic encephalopathy after presenting from home to [facility] ED for evaluation of altered mental status.  Recently hospitalized from December 22 to January 4 for altered mental status.  At that time also she was on Precedex.  She went into respiratory failure had to be intubated.  Supposed to discharge to skilled nursing facility which the patient eventually declined and went home.  Presented back on 1/16 with altered mental status.  Again placed on Precedex infusion in the ICU.  Currently on phenobarbital taper.  Precedex was tapered off.  Also developed rash in her back thought to be related to bedbugs.  Transferred to hospitalist service.  Consultants: Critical care medicine.  Psychiatry  Procedures: None    Subjective: Patient crying this morning.  States that she wants to go home.  She was told that she was not quite ready for discharge.  She is insisting on going home and is threatening to leave [place].  See below    Assessment/Plan:  * Acute metabolic encephalopathy- (present on admission) Thought to be secondary to substance abuse.  This includes alcohol as well as cocaine.  Initially admitted to the ICU.  Was on Precedex infusion which has been weaned off.  Currently on phenobarbital taper.  Also started on Depakote.  Mentation is gradually improving.  Psychiatry  has signed off.  Patient is alert and oriented x3.  She says she wants to go home today.  She was told that she was not medically ready for discharge.  She is threatening to leave [place].  She was advised not to do so due to significant risk of deterioration.  Polysubstance abuse ([place])- (present on admission) Has been counseled.  Seen by psychiatry.  See above.  AKI (acute kidney injury) ([place])- (present on admission) Chronic kidney disease stage IIIb  Renal function slowly improving.  Monitor urine output.  Avoid nephrotoxic agents.  IV fluids being continued.  Pustular rash Reason for her pustular rash on her back is thought to be bedbugs.  Drug reaction is also a possibility.  She is noted to be afebrile.  She was started on hydrocortisone cream.  Rash was present even before she was started on Depakote so Depakote is unlikely to be the culprit.  Continue to monitor for now.  Hypertension- (present on admission) Sinus tachycardia  Noted to have tachycardia even during previous hospitalization.  Dose of carvedilol has been increased.  She is on amlodipine which is being continued.  Continue to monitor closely.  Hypernatremia- (present on admission) Resolved  Anemia- (present on admission) Hemoglobin close to baseline.  No evidence of overt blood loss.  Continue to monitor.   Severe protein calorie malnutrition Nutrition Problem: Severe Malnutrition Etiology: social / environmental circumstances (polysubstance abuse) Signs/Symptoms: severe muscle depletion, severe fat depletion Interventions: Ensure Enlive (each supplement provides 350kcal and 20 grams of protein), MVI   DVT Prophylaxis: Subcutaneous heparin Code Status: Full code Family Communication: No family  at bedside.  Discussed with patient Disposition Plan: Home when medically stable  Status is: Inpatient  Remains inpatient appropriate because: Tachycardia, pustular rash,          Medications:  Scheduled:  acetaminophen  1,000 mg Oral TID   amLODipine  5 mg Oral Daily   busPIRone  15 mg Oral BID   carvedilol  6.25 mg Oral BID WC   chlorhexidine  15 mL Mouth Rinse BID   Chlorhexidine Gluconate Cloth  6 each Topical Daily   feeding supplement  237 mL Oral TID BM   folic acid  1 mg Oral Daily   heparin  5,000 Units Subcutaneous Q8H   lidocaine  1 patch Transdermal Q24H   mouth rinse  15 mL Mouth Rinse q12n4p   melatonin  3 mg Oral QHS   multivitamin with minerals  1 tablet Oral Daily   phenobarbital  32.4 mg Oral Q8H   senna-docusate  1 tablet Oral BID   [START ON 11/27/2021] thiamine injection  100 mg Intravenous Daily   Continuous:  dextrose 50 mL/hr at 11/23/21 0600   thiamine injection Stopped (11/22/21 1233)   valproate sodium 250 mg (11/23/21 0931)   BTD:[id number] **OR** acetaminophen, hydrocortisone cream, labetalol, LORazepam, nicotine  Antibiotics: Anti-infectives (From admission, onward)    None       Objective:  Vital Signs  Vitals:   11/23/21 0630 11/23/21 0732 11/23/21 0741 11/23/21 0925  BP:   (!) 151/118 118/81  Pulse: (!) 115  (!) 128   Resp: 13     Temp:  98.3 F (36.8 C)    TempSrc:  Oral    SpO2: (!) 85%     Weight:        Intake/Output Summary (Last 24 hours) at 11/23/2021 1035 Last data filed at 11/23/2021 0600 Gross per 24 hour  Intake 1782.95 ml  Output 1750 ml  Net 32.95 ml   Filed Weights   11/22/21 0000 11/22/21 0429 11/23/21 0122  Weight: 46.2 kg 46.2 kg 45.2 kg    General appearance: Awake alert.  In no distress Resp: Clear to auscultation bilaterally.  Normal effort Cardio: S1-S2 is normal regular.  No S3-S4.  No rubs murmurs or bruit GI: Abdomen is soft.  Nontender nondistended.  Bowel sounds are present normal.  No masses organomegaly Extremities: No edema.  Full range of motion of lower extremities. Skin: Diffuse erythematous rash in various stages noted.  Varying from macular lesions to pustular  regions. Neurologic: Alert and oriented x3.  No focal neurological deficits.    Lab Results:  Data Reviewed: I have personally reviewed labs and imaging study reports  CBC: Recent Labs  Lab 11/18/21 2235 11/19/21 0333 11/19/21 0336 11/20/21 0142  WBC 5.0  --  3.2* 5.3  NEUTROABS 2.9  --  1.8 3.6  HGB 9.9* 11.9* 12.4 9.4*  HCT 31.4* 35.0* 41.3 30.3*  MCV 90.8  --  94.3 91.8  PLT 289  --  257 694    Basic Metabolic Panel: Recent Labs  Lab 11/19/21 0336 11/19/21 1217 11/20/21 0142 11/20/21 1301 11/21/21 0131 11/22/21 0340 11/23/21 0600  NA 146*   < > 150* 142 141 135 137  K 3.8   < > 3.6 4.2 3.5 3.6 4.0  CL 116*   < > 117* 112* 113* 109 107  CO2 16*   < > 16* 19* 21* 20* 19*  GLUCOSE 73   < > 92 122* 108* 107* 106*  BUN 47*   < >  42* 39* 34* 28* 26*  CREATININE 1.63*   < > 1.60* 1.66* 1.69* 1.73* 1.62*  CALCIUM 9.6   < > 9.8 9.7 9.6 8.2* 9.0  MG 2.2  --  1.9  --  2.0 1.7  --   PHOS  --   --  3.6  --   --   --   --    < > = values in this interval not displayed.    GFR: Estimated Creatinine Clearance: 28.3 mL/min (A) (by C-G formula based on SCr of 1.62 mg/dL (H)).  Liver Function Tests: Recent Labs  Lab 11/18/21 2235 11/19/21 0336 11/20/21 0142  AST 26 31 30   ALT 19 21 22   ALKPHOS 58 66 59  BILITOT 0.3 0.4 0.8  PROT 7.3 7.6 7.2  ALBUMIN 3.0* 3.0* 3.0*    No results for input(s): LIPASE, AMYLASE in the last 168 hours. Recent Labs  Lab 11/18/21 2237  AMMONIA 20    Coagulation Profile: Recent Labs  Lab 11/18/21 2235 11/19/21 0336  INR 1.0 1.0    Cardiac Enzymes: Recent Labs  Lab 11/19/21 0338 11/20/21 0142  CKTOTAL 325* 226     CBG: Recent Labs  Lab 11/21/21 2347 11/22/21 0353 11/22/21 0803 11/22/21 1111 11/22/21 2009  GLUCAP 97 102* 83 118* 104*     Recent Results (from the past 240 hour(s))  Resp Panel by RT-PCR (Flu A&B, Covid) Nasopharyngeal Swab     Status: None   Collection Time: 11/18/21  7:10 PM   Specimen:  Nasopharyngeal Swab; Nasopharyngeal(NP) swabs in vial transport medium  Result Value Ref Range Status   SARS Coronavirus 2 by RT PCR NEGATIVE NEGATIVE Final    Comment: (NOTE) SARS-CoV-2 target nucleic acids are NOT DETECTED.  The SARS-CoV-2 RNA is generally detectable in upper respiratory specimens during the acute phase of infection. The lowest concentration of SARS-CoV-2 viral copies this assay can detect is 138 copies/mL. A negative result does not preclude SARS-Cov-2 infection and should not be used as the sole basis for treatment or other patient management decisions. A negative result may occur with  improper specimen collection/handling, submission of specimen other than nasopharyngeal swab, presence of viral mutation(s) within the areas targeted by this assay, and inadequate number of viral copies(<138 copies/mL). A negative result must be combined with clinical observations, patient history, and epidemiological information. The expected result is Negative.  Fact Sheet for Patients:  EntrepreneurPulse.com.au  Fact Sheet for Healthcare Providers:  IncredibleEmployment.be  This test is no t yet approved or cleared by the [place] FDA and  has been authorized for detection and/or diagnosis of SARS-CoV-2 by FDA under an Emergency Use Authorization (EUA). This EUA will remain  in effect (meaning this test can be used) for the duration of the COVID-19 declaration under Section 564(b)(1) of the Act, 21 U.S.C.section 360bbb-3(b)(1), unless the authorization is terminated  or revoked sooner.       Influenza A by PCR NEGATIVE NEGATIVE Final   Influenza B by PCR NEGATIVE NEGATIVE Final    Comment: (NOTE) The Xpert Xpress SARS-CoV-2/FLU/RSV plus assay is intended as an aid in the diagnosis of influenza from Nasopharyngeal swab specimens and should not be used as a sole basis for treatment. Nasal washings and aspirates are unacceptable for  Xpert Xpress SARS-CoV-2/FLU/RSV testing.  Fact Sheet for Patients: EntrepreneurPulse.com.au  Fact Sheet for Healthcare Providers: IncredibleEmployment.be  This test is not yet approved or cleared by the [place] FDA and has been authorized for detection and/or diagnosis  of SARS-CoV-2 by FDA under an Emergency Use Authorization (EUA). This EUA will remain in effect (meaning this test can be used) for the duration of the COVID-19 declaration under Section 564(b)(1) of the Act, 21 U.S.C. section 360bbb-3(b)(1), unless the authorization is terminated or revoked.  Performed at [facility], [place] [address], [place], [place] [address]       Radiology Studies: No results found.     LOS: 4 days   Sherry Baker [employer] on [email or web address]  11/23/2021, 10:35 AM

## 2021-11-23 NOTE — Hospital Course (Signed)
56 y.o. female current every day smoker with medical history significant for polysubstance abuse, including cocaine use as well as alcohol abuse, type I bipolar disorder, central hypertension, generalized anxiety disorder, stage IIIb chronic kidney disease with associated baseline creatinine 1.3-1.6, chronic anemia with baseline hemoglobin 10-12, who was admitted to [facility] on 11/18/2021 with acute metabolic encephalopathy after presenting from home to [facility] ED for evaluation of altered mental status.  Recently hospitalized from December 22 to January 4 for altered mental status.  At that time also she was on Precedex.  She went into respiratory failure had to be intubated.  Supposed to discharge to skilled nursing facility which the patient eventually declined and went home.  Presented back on 1/16 with altered mental status.  Again placed on Precedex infusion in the ICU.  Currently on phenobarbital taper.  Precedex was tapered off.  Also developed rash in her back thought to be related to bedbugs.  Transferred to hospitalist service.

## 2021-11-23 NOTE — Progress Notes (Signed)
[place] Progress Note Patient Name: Sherry Baker DOB: 1966-03-12 MRN: [id number]   Date of Service  11/23/2021  HPI/Events of Note  Patient asking for resumption of her home Tramadol dose.  eICU Interventions  Tramadol not ordered due to  concern for prolonged  QTC.        Kerry Kass Miho Monda 11/23/2021, 5:08 AM

## 2021-11-23 NOTE — Progress Notes (Addendum)
The patient left AMA and signed Sherry Baker paperwork.  Attempted multiple times to redirect patient and encourage patient to stay. Dr. Maryland Pink made aware throughout the day that the patient was attempting to leave. All IVs were taken out and patient left per policy. Patients belongings with her. Dr. Maryland Pink made aware that the patient had left. Marland Kitchen

## 2021-11-23 NOTE — Assessment & Plan Note (Signed)
Has been counseled.  Seen by psychiatry.  See above.

## 2021-11-23 NOTE — Assessment & Plan Note (Signed)
Hemoglobin close to baseline.  No evidence of overt blood loss.  Continue to monitor.

## 2021-11-23 NOTE — Assessment & Plan Note (Signed)
Chronic kidney disease stage IIIb  Renal function slowly improving.  Monitor urine output.  Avoid nephrotoxic agents.  IV fluids being continued.

## 2021-11-23 NOTE — Discharge Summary (Signed)
[facility]  Physician Discharge Summary   Patient ID: Sherry Baker MRN: [id number] DOB/AGE: February 26, 1966 56 y.o.  Admit date: 11/18/2021 Discharge date: 11/23/2021    PCP: Pcp, No  DISCHARGE DIAGNOSES:  Active Problems:   Pustular rash   Polysubstance abuse (HCC)   Anemia   Hypertension   GERD (gastroesophageal reflux disease)  Principal Problem (Resolved):   Acute metabolic encephalopathy Resolved Problems:   AKI (acute kidney injury) ([place])   Hypernatremia   Dehydration   Acute prerenal azotemia   PATIENT LEFT AGAINST MEDICAL ADVICE.  INITIAL HISTORY: 56 y.o. female current every day smoker with medical history significant for polysubstance abuse, including cocaine use as well as alcohol abuse, type I bipolar disorder, central hypertension, generalized anxiety disorder, stage IIIb chronic kidney disease with associated baseline creatinine 1.3-1.6, chronic anemia with baseline hemoglobin 10-12, who was admitted to [facility] on 11/18/2021 with acute metabolic encephalopathy after presenting from home to [facility] ED for evaluation of altered mental status.  Recently hospitalized from December 22 to January 4 for altered mental status.  At that time also she was on Precedex.  She went into respiratory failure had to be intubated.  Supposed to discharge to skilled nursing facility which the patient eventually declined and went home.  Presented back on 1/16 with altered mental status.  Again placed on Precedex infusion in the ICU.  Currently on phenobarbital taper.  Precedex was tapered off.  Also developed rash in her back thought to be related to bedbugs.  Transferred to hospitalist service.   HOSPITAL COURSE:    * Acute metabolic encephalopathy-resolved as of 11/25/2021, (present on admission) Thought to be secondary to substance abuse.  This includes alcohol as well as cocaine.  Initially admitted to the ICU.  Was on Precedex infusion which has been weaned off.   Currently on phenobarbital taper.  Also started on Depakote.  Mentation is gradually improving.  Psychiatry has signed off.  Patient is alert and oriented x3.  She says she wants to go home today.  She was told that she was not medically ready for discharge.  She is threatening to leave [place].  She was advised not to do so due to significant risk of deterioration.  Pustular rash Reason for her pustular rash on her back is thought to be bedbugs.  Drug reaction is also a possibility.  She is noted to be afebrile.  She was started on hydrocortisone cream.  Rash was present even before she was started on Depakote so Depakote is unlikely to be the culprit.  Continue to monitor for now.  Polysubstance abuse ([place])- (present on admission) Has been counseled.  Seen by psychiatry.  See above.  AKI (acute kidney injury) (HCC)-resolved as of 11/25/2021, (present on admission) Chronic kidney disease stage IIIb  Renal function slowly improving.  Monitor urine output.  Avoid nephrotoxic agents.  IV fluids being continued.  Hypertension- (present on admission) Sinus tachycardia  Noted to have tachycardia even during previous hospitalization.  Dose of carvedilol has been increased.  She is on amlodipine which is being continued.  Continue to monitor closely.  Anemia- (present on admission) Hemoglobin close to baseline.  No evidence of overt blood loss.  Continue to monitor.  Hypernatremia-resolved as of 11/25/2021, (present on admission) Resolved      Severe protein calorie malnutrition Nutrition Problem: Severe Malnutrition Etiology: social / environmental circumstances (polysubstance abuse)  Signs/Symptoms: severe muscle depletion, severe fat depletion  Interventions: Ensure Enlive (each supplement provides  350kcal and 20 grams of protein), MVI  PATIENT LEFT AGAINST MEDICAL ADVICE.   PERTINENT LABS:  The results of significant diagnostics from this hospitalization (including  imaging, microbiology, ancillary and laboratory) are listed below for reference.    Microbiology: Recent Results (from the past 240 hour(s))  Resp Panel by RT-PCR (Flu A&B, Covid) Nasopharyngeal Swab     Status: None   Collection Time: 11/18/21  7:10 PM   Specimen: Nasopharyngeal Swab; Nasopharyngeal(NP) swabs in vial transport medium  Result Value Ref Range Status   SARS Coronavirus 2 by RT PCR NEGATIVE NEGATIVE Final    Comment: (NOTE) SARS-CoV-2 target nucleic acids are NOT DETECTED.  The SARS-CoV-2 RNA is generally detectable in upper respiratory specimens during the acute phase of infection. The lowest concentration of SARS-CoV-2 viral copies this assay can detect is 138 copies/mL. A negative result does not preclude SARS-Cov-2 infection and should not be used as the sole basis for treatment or other patient management decisions. A negative result may occur with  improper specimen collection/handling, submission of specimen other than nasopharyngeal swab, presence of viral mutation(s) within the areas targeted by this assay, and inadequate number of viral copies(<138 copies/mL). A negative result must be combined with clinical observations, patient history, and epidemiological information. The expected result is Negative.  Fact Sheet for Patients:  EntrepreneurPulse.com.au  Fact Sheet for Healthcare Providers:  IncredibleEmployment.be  This test is no t yet approved or cleared by the [place] FDA and  has been authorized for detection and/or diagnosis of SARS-CoV-2 by FDA under an Emergency Use Authorization (EUA). This EUA will remain  in effect (meaning this test can be used) for the duration of the COVID-19 declaration under Section 564(b)(1) of the Act, 21 U.S.C.section 360bbb-3(b)(1), unless the authorization is terminated  or revoked sooner.       Influenza A by PCR NEGATIVE NEGATIVE Final   Influenza B by PCR NEGATIVE  NEGATIVE Final    Comment: (NOTE) The Xpert Xpress SARS-CoV-2/FLU/RSV plus assay is intended as an aid in the diagnosis of influenza from Nasopharyngeal swab specimens and should not be used as a sole basis for treatment. Nasal washings and aspirates are unacceptable for Xpert Xpress SARS-CoV-2/FLU/RSV testing.  Fact Sheet for Patients: EntrepreneurPulse.com.au  Fact Sheet for Healthcare Providers: IncredibleEmployment.be  This test is not yet approved or cleared by the [place] FDA and has been authorized for detection and/or diagnosis of SARS-CoV-2 by FDA under an Emergency Use Authorization (EUA). This EUA will remain in effect (meaning this test can be used) for the duration of the COVID-19 declaration under Section 564(b)(1) of the Act, 21 U.S.C. section 360bbb-3(b)(1), unless the authorization is terminated or revoked.  Performed at [facility], [place] [address] [place], [place] [address]   Resp Panel by RT-PCR (Flu A&B, Covid) Nasopharyngeal Swab     Status: None   Collection Time: 11/25/21  4:20 AM   Specimen: Nasopharyngeal Swab; Nasopharyngeal(NP) swabs in vial transport medium  Result Value Ref Range Status   SARS Coronavirus 2 by RT PCR NEGATIVE NEGATIVE Final    Comment: (NOTE) SARS-CoV-2 target nucleic acids are NOT DETECTED.  The SARS-CoV-2 RNA is generally detectable in upper respiratory specimens during the acute phase of infection. The lowest concentration of SARS-CoV-2 viral copies this assay can detect is 138 copies/mL. A negative result does not preclude SARS-Cov-2 infection and should not be used as the sole basis for treatment or other patient management decisions. A negative result may occur with  improper specimen collection/handling, submission of specimen other than nasopharyngeal swab, presence of viral mutation(s) within the areas targeted by this assay, and inadequate number of viral copies(<138  copies/mL). A negative result must be combined with clinical observations, patient history, and epidemiological information. The expected result is Negative.  Fact Sheet for Patients:  EntrepreneurPulse.com.au  Fact Sheet for Healthcare Providers:  IncredibleEmployment.be  This test is no t yet approved or cleared by the [place] FDA and  has been authorized for detection and/or diagnosis of SARS-CoV-2 by FDA under an Emergency Use Authorization (EUA). This EUA will remain  in effect (meaning this test can be used) for the duration of the COVID-19 declaration under Section 564(b)(1) of the Act, 21 U.S.C.section 360bbb-3(b)(1), unless the authorization is terminated  or revoked sooner.       Influenza A by PCR NEGATIVE NEGATIVE Final   Influenza B by PCR NEGATIVE NEGATIVE Final    Comment: (NOTE) The Xpert Xpress SARS-CoV-2/FLU/RSV plus assay is intended as an aid in the diagnosis of influenza from Nasopharyngeal swab specimens and should not be used as a sole basis for treatment. Nasal washings and aspirates are unacceptable for Xpert Xpress SARS-CoV-2/FLU/RSV testing.  Fact Sheet for Patients: EntrepreneurPulse.com.au  Fact Sheet for Healthcare Providers: IncredibleEmployment.be  This test is not yet approved or cleared by the [place] FDA and has been authorized for detection and/or diagnosis of SARS-CoV-2 by FDA under an Emergency Use Authorization (EUA). This EUA will remain in effect (meaning this test can be used) for the duration of the COVID-19 declaration under Section 564(b)(1) of the Act, 21 U.S.C. section 360bbb-3(b)(1), unless the authorization is terminated or revoked.  Performed at [facility], [place] [address] [place], [place] [address]      Labs:  COVID-19 Labs   Lab Results  Component Value Date   SARSCOV2NAA NEGATIVE 11/25/2021   [place] NEGATIVE  11/18/2021   [place] NEGATIVE 10/24/2021   [place] NEGATIVE 09/05/2021      Basic Metabolic Panel: Recent Labs  Lab 11/19/21 0336 11/19/21 1217 11/20/21 0142 11/20/21 1301 11/21/21 0131 11/22/21 0340 11/23/21 0600 11/25/21 0158  NA 146*   < > 150* 142 141 135 137 130*  K 3.8   < > 3.6 4.2 3.5 3.6 4.0 4.2  CL 116*   < > 117* 112* 113* 109 107 101  CO2 16*   < > 16* 19* 21* 20* 19* 17*  GLUCOSE 73   < > 92 122* 108* 107* 106* 125*  BUN 47*   < > 42* 39* 34* 28* 26* 22*  CREATININE 1.63*   < > 1.60* 1.66* 1.69* 1.73* 1.62* 1.72*  CALCIUM 9.6   < > 9.8 9.7 9.6 8.2* 9.0 8.5*  MG 2.2  --  1.9  --  2.0 1.7  --  2.0  PHOS  --   --  3.6  --   --   --   --   --    < > = values in this interval not displayed.   Liver Function Tests: Recent Labs  Lab 11/18/21 2235 11/19/21 0336 11/20/21 0142 11/25/21 0158  AST 26 31 30 17   ALT 19 21 22 15   ALKPHOS 58 66 59 68  BILITOT 0.3 0.4 0.8 0.3  PROT 7.3 7.6 7.2 7.8  ALBUMIN 3.0* 3.0* 3.0* 2.9*    Recent Labs  Lab 11/18/21 2237  AMMONIA 20   CBC: Recent Labs  Lab 11/18/21 2235 11/19/21 0333 11/19/21 0336 11/20/21 0142  11/25/21 0158 11/25/21 0450  WBC 5.0  --  3.2* 5.3 12.0* 10.3  NEUTROABS 2.9  --  1.8 3.6 10.1* 8.6*  HGB 9.9* 11.9* 12.4 9.4* 10.0* 9.1*  HCT 31.4* 35.0* 41.3 30.3* 32.2* 28.0*  MCV 90.8  --  94.3 91.8 91.5 89.7  PLT 289  --  257 253 196 159   Cardiac Enzymes: Recent Labs  Lab 11/19/21 0338 11/20/21 0142  CKTOTAL 325* 226   BNP: BNP (last 3 results) Recent Labs    03/30/21 0153 06/23/21 0100  BNP 231.8* 485.8*    ProBNP (last 3 results) No results for input(s): PROBNP in the last 8760 hours.  CBG: Recent Labs  Lab 11/21/21 2347 11/22/21 0353 11/22/21 0803 11/22/21 1111 11/22/21 2009  GLUCAP 97 102* 83 118* 104*     IMAGING STUDIES DG Chest 1 View  Result Date: 11/18/2021 CLINICAL DATA:  Altered mental status. EXAM: CHEST  1 VIEW COMPARISON:  November 03, 2021 FINDINGS:  The heart size and mediastinal contours are within normal limits. Mild increased pulmonary interstitium is identified bilaterally. The visualized skeletal structures are unremarkable. IMPRESSION: Mild increased pulmonary interstitium is identified bilaterally. Mild pulmonary edema versus viral infection. Electronically Signed   By: Abelardo Diesel M.D.   On: 11/18/2021 19:03   DG Chest 2 View  Result Date: 11/25/2021 CLINICAL DATA:  Chest pain. EXAM: CHEST - 2 VIEW COMPARISON:  Radiograph dated 11/20/2021. FINDINGS: Minimal bibasilar atelectasis. No focal consolidation, pleural effusion, pneumothorax. The cardiac silhouette is within normal limits. No acute osseous pathology. IMPRESSION: No active cardiopulmonary disease. Electronically Signed   By: Anner Crete M.D.   On: 11/25/2021 03:00   DG Abd 1 View  Result Date: 10/30/2021 CLINICAL DATA:  NG tube placement. EXAM: ABDOMEN - 1 VIEW COMPARISON:  One-view abdomen 10/26/2021 FINDINGS: Heart is mildly enlarged. Mild pulmonary vascular congestion is present. Lung bases are otherwise clear. Small bore feeding tube is now in place. The tip is in the distal stomach. Visualized bowel gas pattern is within normal limits. IMPRESSION: Small bore feeding tube in place with the tip in the distal stomach. Electronically Signed   By: San Morelle M.D.   On: 10/30/2021 15:59   CT HEAD WO CONTRAST (5MM)  Result Date: 11/18/2021 CLINICAL DATA:  Mental status changes.  Smoking crack at home. EXAM: CT HEAD WITHOUT CONTRAST TECHNIQUE: Contiguous axial images were obtained from the base of the skull through the vertex without intravenous contrast. RADIATION DOSE REDUCTION: This exam was performed according to the departmental dose-optimization program which includes automated exposure control, adjustment of the mA and/or kV according to patient size and/or use of iterative reconstruction technique. COMPARISON:  10/25/2021 FINDINGS: Brain: There is central and  cortical atrophy. There is no intra or extra-axial fluid collection or mass lesion. The basilar cisterns and ventricles have a normal appearance. There is no CT evidence for acute infarction or hemorrhage. Vascular: There is dense atherosclerotic calcification of the internal carotid arteries. No hyperdense vessels. Skull: Normal. Negative for fracture or focal lesion. Sinuses/Orbits: LEFT mastoid effusion, stable. Minimal opacification of the paranasal sinuses. Other: None. IMPRESSION: 1.  No evidence for acute intracranial abnormality. 2. Central and cortical atrophy. 3. LEFT mastoid effusion. Electronically Signed   By: Nolon Nations M.D.   On: 11/18/2021 18:55   US Abdomen Complete  Result Date: 10/27/2021 CLINICAL DATA:  Alcohol use disorder severe with dependence, altered mental status, thrombocytopenia, history of rectal cancer, cirrhosis EXAM: ABDOMEN ULTRASOUND COMPLETE COMPARISON:  CT abdomen  and pelvis 12/18/2018 FINDINGS: Gallbladder: Debris/sludge within gallbladder. Mild gallbladder wall thickening. Tiny dependent shadowing calculi up to 6 mm. No pericholecystic fluid or sonographic Murphy sign. Common bile duct: Diameter: 5 mm, normal Liver: Normal echogenicity without mass or nodularity. Portal vein is patent on color Doppler imaging with normal direction of blood flow towards the liver. IVC: Normal appearance Pancreas: Normal appearance Spleen: Normal appearance, 8.8 cm length Right Kidney: Length: 8.6 cm. Cortical thinning. Increased cortical echogenicity. Lobulated margins. Small cyst at inferior pole 1.8 cm diameter. No definite mass or hydronephrosis. Left Kidney: Length: 11.8 cm. Normal cortical thickness. Upper normal cortical echogenicity with lobulated cortical contour. No mass, hydronephrosis, or shadowing calcification. Abdominal aorta: Normal caliber Other findings: No free fluid IMPRESSION: Small amount of sludge and tiny calculi within gallbladder with mild gallbladder wall  thickening, but no pericholecystic fluid or sonographic Murphy sign. Atrophic scarred RIGHT kidney with small cyst. Remainder of exam unremarkable. Electronically Signed   By: Lavonia Dana M.D.   On: 10/27/2021 16:50   DG CHEST PORT 1 VIEW  Result Date: 11/20/2021 CLINICAL DATA:  Evaluate for aspiration. EXAM: PORTABLE CHEST 1 VIEW COMPARISON:  11/18/21 FINDINGS: Stable cardiomediastinal contours. No pleural effusion or edema. No airspace opacities identified to suggest aspiration or pneumonia. IMPRESSION: No active disease. Electronically Signed   By: Kerby Moors M.D.   On: 11/20/2021 05:41   DG CHEST PORT 1 VIEW  Result Date: 11/03/2021 CLINICAL DATA:  Encounter for aspiration pneumonitis. EXAM: PORTABLE CHEST 1 VIEW COMPARISON:  11/01/2021 FINDINGS: Small densities at the medial right lung base are minimally changed. Otherwise, the lungs are clear. Feeding tube extends into the abdomen but the tip is beyond the image. Heart and mediastinum are within normal limits. Negative for a pneumothorax. Trachea is midline. IMPRESSION: 1. No acute chest findings. Small densities at the medial right lung base are nonspecific. Electronically Signed   By: Markus Daft M.D.   On: 11/03/2021 08:42   DG Chest Port 1 View  Result Date: 11/01/2021 CLINICAL DATA:  Pneumonia EXAM: PORTABLE CHEST 1 VIEW COMPARISON:  October 30, 2021 FINDINGS: The heart size and mediastinal contours are stable. Feeding tube is identified with distal tip not included on the film but is at least in the stomach. Previously noted airspace opacity in the right lung base is significantly decreased. There is mild linear opacity of bilateral medial lung bases, favor atelectasis. The visualized skeletal structures are unremarkable. IMPRESSION: Previously noted airspace opacity in the right lung base is significantly decreased. Electronically Signed   By: Abelardo Diesel M.D.   On: 11/01/2021 13:29   DG Chest Port 1 View  Result Date:  10/30/2021 CLINICAL DATA:  Hypoxia, intubated EXAM: PORTABLE CHEST 1 VIEW COMPARISON:  10/24/2021 FINDINGS: Single frontal view of the chest demonstrates endotracheal tube overlying tracheal air column, tip approximately 2.4 cm from carina. Enteric catheter passes below diaphragm, tip excluded by collimation. Cardiac silhouette is stable. Interval development of patchy right basilar airspace disease. Trace right effusion is suspected. No pneumothorax. No acute bony abnormalities. IMPRESSION: 1. No complications after intubation. 2. Interval development of right basilar airspace disease which could reflect pneumonia or aspiration. Electronically Signed   By: Randa Ngo M.D.   On: 10/30/2021 19:31   DG Hand Complete Left  Result Date: 11/25/2021 CLINICAL DATA:  Swelling and inflammation in the anterior aspect of the second and third metatarsals. EXAM: LEFT HAND - COMPLETE 3+ VIEW COMPARISON:  None. FINDINGS: There is no evidence of fracture  or dislocation. Degenerative changes are noted at the wrist. Soft tissue swelling is present over the dorsum of the hand and digits. Vascular calcifications are present in the soft tissues. IMPRESSION: Diffuse soft tissue swelling over the dorsum of the hand and digits. No acute osseous abnormality. Electronically Signed   By: Brett Fairy M.D.   On: 11/25/2021 03:03     PATIENT LEFT AGAINST MEDICAL ADVICE.  Gerod Caligiuri [employer]  [facility] operational officer on [email or web address]  11/25/2021, 12:03 PM

## 2021-11-23 NOTE — Assessment & Plan Note (Signed)
Thought to be secondary to substance abuse.  This includes alcohol as well as cocaine.  Initially admitted to the ICU.  Was on Precedex infusion which has been weaned off.  Currently on phenobarbital taper.  Also started on Depakote.  Mentation is gradually improving.  Psychiatry has signed off.  Patient is alert and oriented x3.  She says she wants to go home today.  She was told that she was not medically ready for discharge.  She is threatening to leave [place].  She was advised not to do so due to significant risk of deterioration.

## 2021-11-23 NOTE — Assessment & Plan Note (Signed)
Reason for her pustular rash on her back is thought to be bedbugs.  Drug reaction is also a possibility.  She is noted to be afebrile.  She was started on hydrocortisone cream.  Rash was present even before she was started on Depakote so Depakote is unlikely to be the culprit.  Continue to monitor for now.

## 2021-11-25 ENCOUNTER — Observation Stay (HOSPITAL_COMMUNITY): Payer: Medicaid Other

## 2021-11-25 ENCOUNTER — Inpatient Hospital Stay (HOSPITAL_COMMUNITY)
Admission: EM | Admit: 2021-11-25 | Discharge: 2021-12-15 | DRG: 871 | Disposition: A | Discharge status: Home / Self Care | Payer: Medicaid Other | Attending: Internal Medicine | Admitting: Internal Medicine

## 2021-11-25 ENCOUNTER — Encounter (HOSPITAL_COMMUNITY): Payer: Self-pay

## 2021-11-25 ENCOUNTER — Emergency Department (HOSPITAL_COMMUNITY): Payer: Medicaid Other

## 2021-11-25 DIAGNOSIS — E43 Unspecified severe protein-calorie malnutrition: Secondary | ICD-10-CM | POA: Diagnosis present

## 2021-11-25 DIAGNOSIS — Z8616 Personal history of COVID-19: Secondary | ICD-10-CM

## 2021-11-25 DIAGNOSIS — R7881 Bacteremia: Secondary | ICD-10-CM

## 2021-11-25 DIAGNOSIS — F17210 Nicotine dependence, cigarettes, uncomplicated: Secondary | ICD-10-CM | POA: Diagnosis present

## 2021-11-25 DIAGNOSIS — K219 Gastro-esophageal reflux disease without esophagitis: Secondary | ICD-10-CM | POA: Diagnosis present

## 2021-11-25 DIAGNOSIS — A4102 Sepsis due to Methicillin resistant Staphylococcus aureus: Principal | ICD-10-CM | POA: Diagnosis present

## 2021-11-25 DIAGNOSIS — Z5901 Sheltered homelessness: Secondary | ICD-10-CM | POA: Diagnosis not present

## 2021-11-25 DIAGNOSIS — E871 Hypo-osmolality and hyponatremia: Secondary | ICD-10-CM | POA: Diagnosis present

## 2021-11-25 DIAGNOSIS — I129 Hypertensive chronic kidney disease with stage 1 through stage 4 chronic kidney disease, or unspecified chronic kidney disease: Secondary | ICD-10-CM | POA: Diagnosis present

## 2021-11-25 DIAGNOSIS — G8929 Other chronic pain: Secondary | ICD-10-CM | POA: Diagnosis not present

## 2021-11-25 DIAGNOSIS — F4323 Adjustment disorder with mixed anxiety and depressed mood: Secondary | ICD-10-CM

## 2021-11-25 DIAGNOSIS — Z79899 Other long term (current) drug therapy: Secondary | ICD-10-CM | POA: Diagnosis not present

## 2021-11-25 DIAGNOSIS — I10 Essential (primary) hypertension: Secondary | ICD-10-CM | POA: Diagnosis present

## 2021-11-25 DIAGNOSIS — Z20822 Contact with and (suspected) exposure to covid-19: Secondary | ICD-10-CM | POA: Diagnosis present

## 2021-11-25 DIAGNOSIS — F1910 Other psychoactive substance abuse, uncomplicated: Secondary | ICD-10-CM | POA: Diagnosis present

## 2021-11-25 DIAGNOSIS — M25559 Pain in unspecified hip: Secondary | ICD-10-CM

## 2021-11-25 DIAGNOSIS — N1832 Chronic kidney disease, stage 3b: Secondary | ICD-10-CM | POA: Diagnosis present

## 2021-11-25 DIAGNOSIS — I340 Nonrheumatic mitral (valve) insufficiency: Secondary | ICD-10-CM | POA: Diagnosis not present

## 2021-11-25 DIAGNOSIS — L89159 Pressure ulcer of sacral region, unspecified stage: Secondary | ICD-10-CM | POA: Diagnosis not present

## 2021-11-25 DIAGNOSIS — L89154 Pressure ulcer of sacral region, stage 4: Secondary | ICD-10-CM | POA: Diagnosis present

## 2021-11-25 DIAGNOSIS — F1410 Cocaine abuse, uncomplicated: Secondary | ICD-10-CM | POA: Diagnosis present

## 2021-11-25 DIAGNOSIS — Z800 Family history of malignant neoplasm of digestive organs: Secondary | ICD-10-CM | POA: Diagnosis not present

## 2021-11-25 DIAGNOSIS — Z933 Colostomy status: Secondary | ICD-10-CM | POA: Diagnosis not present

## 2021-11-25 DIAGNOSIS — N179 Acute kidney failure, unspecified: Secondary | ICD-10-CM | POA: Diagnosis not present

## 2021-11-25 DIAGNOSIS — Z808 Family history of malignant neoplasm of other organs or systems: Secondary | ICD-10-CM

## 2021-11-25 DIAGNOSIS — K7460 Unspecified cirrhosis of liver: Secondary | ICD-10-CM | POA: Diagnosis present

## 2021-11-25 DIAGNOSIS — D638 Anemia in other chronic diseases classified elsewhere: Secondary | ICD-10-CM | POA: Diagnosis not present

## 2021-11-25 DIAGNOSIS — R079 Chest pain, unspecified: Secondary | ICD-10-CM | POA: Diagnosis present

## 2021-11-25 DIAGNOSIS — Z681 Body mass index (BMI) 19 or less, adult: Secondary | ICD-10-CM

## 2021-11-25 DIAGNOSIS — L03114 Cellulitis of left upper limb: Secondary | ICD-10-CM | POA: Diagnosis present

## 2021-11-25 DIAGNOSIS — N289 Disorder of kidney and ureter, unspecified: Secondary | ICD-10-CM

## 2021-11-25 DIAGNOSIS — S31000D Unspecified open wound of lower back and pelvis without penetration into retroperitoneum, subsequent encounter: Secondary | ICD-10-CM

## 2021-11-25 DIAGNOSIS — D649 Anemia, unspecified: Secondary | ICD-10-CM

## 2021-11-25 DIAGNOSIS — L080 Pyoderma: Secondary | ICD-10-CM | POA: Diagnosis present

## 2021-11-25 DIAGNOSIS — M25551 Pain in right hip: Secondary | ICD-10-CM

## 2021-11-25 DIAGNOSIS — B9562 Methicillin resistant Staphylococcus aureus infection as the cause of diseases classified elsewhere: Secondary | ICD-10-CM | POA: Diagnosis not present

## 2021-11-25 DIAGNOSIS — K435 Parastomal hernia without obstruction or  gangrene: Secondary | ICD-10-CM | POA: Diagnosis not present

## 2021-11-25 DIAGNOSIS — M1611 Unilateral primary osteoarthritis, right hip: Secondary | ICD-10-CM | POA: Diagnosis present

## 2021-11-25 DIAGNOSIS — N183 Chronic kidney disease, stage 3 unspecified: Secondary | ICD-10-CM | POA: Diagnosis present

## 2021-11-25 DIAGNOSIS — M25552 Pain in left hip: Secondary | ICD-10-CM | POA: Diagnosis not present

## 2021-11-25 DIAGNOSIS — Z85048 Personal history of other malignant neoplasm of rectum, rectosigmoid junction, and anus: Secondary | ICD-10-CM

## 2021-11-25 DIAGNOSIS — M169 Osteoarthritis of hip, unspecified: Secondary | ICD-10-CM | POA: Diagnosis not present

## 2021-11-25 DIAGNOSIS — R262 Difficulty in walking, not elsewhere classified: Secondary | ICD-10-CM

## 2021-11-25 DIAGNOSIS — M25451 Effusion, right hip: Secondary | ICD-10-CM

## 2021-11-25 LAB — CBC WITH DIFFERENTIAL/PLATELET
Abs Immature Granulocytes: 0.17 10*3/uL — ABNORMAL HIGH (ref 0.00–0.07)
Abs Immature Granulocytes: 0.11 10*3/uL — ABNORMAL HIGH (ref 0.00–0.07)
Basophils Absolute: 0 10*3/uL (ref 0.0–0.1)
Basophils Absolute: 0 10*3/uL (ref 0.0–0.1)
Basophils Relative: 0 %
Basophils Relative: 0 %
Eosinophils Absolute: 0.1 10*3/uL (ref 0.0–0.5)
Eosinophils Absolute: 0.1 10*3/uL (ref 0.0–0.5)
Eosinophils Relative: 1 %
Eosinophils Relative: 1 %
HCT: 32.2 % — ABNORMAL LOW (ref 36.0–46.0)
HCT: 28 % — ABNORMAL LOW (ref 36.0–46.0)
Hemoglobin: 10 g/dL — ABNORMAL LOW (ref 12.0–15.0)
Hemoglobin: 9.1 g/dL — ABNORMAL LOW (ref 12.0–15.0)
Immature Granulocytes: 1 %
Immature Granulocytes: 1 %
Lymphocytes Relative: 5 %
Lymphocytes Relative: 5 %
Lymphs Abs: 0.6 10*3/uL — ABNORMAL LOW (ref 0.7–4.0)
Lymphs Abs: 0.6 10*3/uL — ABNORMAL LOW (ref 0.7–4.0)
MCH: 28.4 pg (ref 26.0–34.0)
MCH: 29.2 pg (ref 26.0–34.0)
MCHC: 31.1 g/dL (ref 30.0–36.0)
MCHC: 32.5 g/dL (ref 30.0–36.0)
MCV: 91.5 fL (ref 80.0–100.0)
MCV: 89.7 fL (ref 80.0–100.0)
Monocytes Absolute: 1.1 10*3/uL — ABNORMAL HIGH (ref 0.1–1.0)
Monocytes Absolute: 0.9 10*3/uL (ref 0.1–1.0)
Monocytes Relative: 9 %
Monocytes Relative: 9 %
Neutro Abs: 10.1 10*3/uL — ABNORMAL HIGH (ref 1.7–7.7)
Neutro Abs: 8.6 10*3/uL — ABNORMAL HIGH (ref 1.7–7.7)
Neutrophils Relative %: 84 %
Neutrophils Relative %: 84 %
Platelets: 196 10*3/uL (ref 150–400)
Platelets: 159 10*3/uL (ref 150–400)
RBC: 3.52 MIL/uL — ABNORMAL LOW (ref 3.87–5.11)
RBC: 3.12 MIL/uL — ABNORMAL LOW (ref 3.87–5.11)
RDW: 18.2 % — ABNORMAL HIGH (ref 11.5–15.5)
RDW: 18.2 % — ABNORMAL HIGH (ref 11.5–15.5)
WBC: 12 10*3/uL — ABNORMAL HIGH (ref 4.0–10.5)
WBC: 10.3 10*3/uL (ref 4.0–10.5)
nRBC: 0 % (ref 0.0–0.2)
nRBC: 0 % (ref 0.0–0.2)

## 2021-11-25 LAB — URINALYSIS, ROUTINE W REFLEX MICROSCOPIC
Bacteria, UA: NONE SEEN
Bilirubin Urine: NEGATIVE
Glucose, UA: NEGATIVE mg/dL
Hgb urine dipstick: NEGATIVE
Ketones, ur: NEGATIVE mg/dL
Leukocytes,Ua: NEGATIVE
Nitrite: NEGATIVE
Protein, ur: 100 mg/dL — AB
Specific Gravity, Urine: 1.012 (ref 1.005–1.030)
pH: 6 (ref 5.0–8.0)

## 2021-11-25 LAB — COMPREHENSIVE METABOLIC PANEL
ALT: 15 U/L (ref 0–44)
ALT: 13 U/L (ref 0–44)
AST: 17 U/L (ref 15–41)
AST: 16 U/L (ref 15–41)
Albumin: 2.9 g/dL — ABNORMAL LOW (ref 3.5–5.0)
Albumin: 2.4 g/dL — ABNORMAL LOW (ref 3.5–5.0)
Alkaline Phosphatase: 68 U/L (ref 38–126)
Alkaline Phosphatase: 66 U/L (ref 38–126)
Anion gap: 12 (ref 5–15)
Anion gap: 9 (ref 5–15)
BUN: 22 mg/dL — ABNORMAL HIGH (ref 6–20)
BUN: 20 mg/dL (ref 6–20)
CO2: 17 mmol/L — ABNORMAL LOW (ref 22–32)
CO2: 18 mmol/L — ABNORMAL LOW (ref 22–32)
Calcium: 8.5 mg/dL — ABNORMAL LOW (ref 8.9–10.3)
Calcium: 8.1 mg/dL — ABNORMAL LOW (ref 8.9–10.3)
Chloride: 101 mmol/L (ref 98–111)
Chloride: 100 mmol/L (ref 98–111)
Creatinine, Ser: 1.72 mg/dL — ABNORMAL HIGH (ref 0.44–1.00)
Creatinine, Ser: 1.63 mg/dL — ABNORMAL HIGH (ref 0.44–1.00)
GFR, Estimated: 35 mL/min — ABNORMAL LOW (ref >60)
GFR, Estimated: 37 mL/min — ABNORMAL LOW (ref >60)
Glucose, Bld: 125 mg/dL — ABNORMAL HIGH (ref 70–99)
Glucose, Bld: 106 mg/dL — ABNORMAL HIGH (ref 70–99)
Potassium: 4.2 mmol/L (ref 3.5–5.1)
Potassium: 3.8 mmol/L (ref 3.5–5.1)
Sodium: 130 mmol/L — ABNORMAL LOW (ref 135–145)
Sodium: 127 mmol/L — ABNORMAL LOW (ref 135–145)
Total Bilirubin: 0.3 mg/dL (ref 0.3–1.2)
Total Bilirubin: 0.4 mg/dL (ref 0.3–1.2)
Total Protein: 7.8 g/dL (ref 6.5–8.1)
Total Protein: 6.9 g/dL (ref 6.5–8.1)

## 2021-11-25 LAB — BLOOD CULTURE ID PANEL (REFLEXED) - BCID2

## 2021-11-25 LAB — RAPID URINE DRUG SCREEN, HOSP PERFORMED
Amphetamines: NOT DETECTED
Barbiturates: POSITIVE — AB
Benzodiazepines: POSITIVE — AB
Cocaine: POSITIVE — AB
Opiates: NOT DETECTED
Tetrahydrocannabinol: NOT DETECTED

## 2021-11-25 LAB — ECHOCARDIOGRAM COMPLETE
AR max vel: 1.8 cm2
AV Area VTI: 1.8 cm2
AV Area mean vel: 1.65 cm2
AV Mean grad: 7 mmHg
AV Peak grad: 12.3 mmHg
Ao pk vel: 1.75 m/s
Height: 62 in
Weight: 1622.59 oz

## 2021-11-25 LAB — PROTIME-INR
INR: 1.2 (ref 0.8–1.2)
Prothrombin Time: 15.1 seconds (ref 11.4–15.2)

## 2021-11-25 LAB — RESP PANEL BY RT-PCR (FLU A&B, COVID) ARPGX2
Influenza A by PCR: NEGATIVE
Influenza B by PCR: NEGATIVE
SARS Coronavirus 2 by RT PCR: NEGATIVE

## 2021-11-25 LAB — MRSA NEXT GEN BY PCR, NASAL: MRSA by PCR Next Gen: DETECTED — AB

## 2021-11-25 LAB — LACTIC ACID, PLASMA
Lactic Acid, Venous: 1.8 mmol/L (ref 0.5–1.9)
Lactic Acid, Venous: 0.9 mmol/L (ref 0.5–1.9)

## 2021-11-25 LAB — I-STAT BETA HCG BLOOD, ED (MC, WL, AP ONLY): I-stat hCG, quantitative: 6.1 m[IU]/mL — ABNORMAL HIGH (ref <5)

## 2021-11-25 LAB — HIV ANTIBODY (ROUTINE TESTING W REFLEX): HIV Screen 4th Generation wRfx: NONREACTIVE

## 2021-11-25 LAB — ETHANOL: Alcohol, Ethyl (B): <10 mg/dL (ref <10)

## 2021-11-25 LAB — MAGNESIUM: Magnesium: 2 mg/dL (ref 1.7–2.4)

## 2021-11-25 MED ORDER — POLYETHYLENE GLYCOL 3350 17 G PO PACK
17.0000 g | PACK | Freq: Every day | ORAL | Status: DC | PRN
  Administered 2021-11-26 – 2021-12-12 (×2): 17 g via ORAL
  Filled 2021-11-25 (×2): qty 1

## 2021-11-25 MED ORDER — SODIUM CHLORIDE 0.9 % IV SOLN
2.0000 g | Freq: Once | INTRAVENOUS | Status: AC
  Administered 2021-11-25: 2 g via INTRAVENOUS
  Filled 2021-11-25: qty 20

## 2021-11-25 MED ORDER — HYDROMORPHONE HCL 1 MG/ML IJ SOLN
0.5000 mg | INTRAMUSCULAR | Status: DC | PRN
  Administered 2021-11-25 – 2021-12-10 (×64): 0.5 mg via INTRAVENOUS
  Filled 2021-11-25 (×25): qty 0.5
  Filled 2021-11-25: qty 1
  Filled 2021-11-25 (×27): qty 0.5
  Filled 2021-11-25: qty 1
  Filled 2021-11-25 (×6): qty 0.5
  Filled 2021-11-25: qty 1
  Filled 2021-11-25 (×5): qty 0.5

## 2021-11-25 MED ORDER — ENOXAPARIN SODIUM 30 MG/0.3ML IJ SOSY
30.0000 mg | PREFILLED_SYRINGE | INTRAMUSCULAR | Status: DC
  Administered 2021-11-25 – 2021-12-14 (×19): 30 mg via SUBCUTANEOUS
  Filled 2021-11-25 (×19): qty 0.3

## 2021-11-25 MED ORDER — PANTOPRAZOLE SODIUM 40 MG PO TBEC
40.0000 mg | DELAYED_RELEASE_TABLET | Freq: Two times a day (BID) | ORAL | Status: DC
  Administered 2021-11-25 – 2021-12-01 (×12): 40 mg via ORAL
  Filled 2021-11-25 (×12): qty 1

## 2021-11-25 MED ORDER — VANCOMYCIN HCL 750 MG/150ML IV SOLN
750.0000 mg | INTRAVENOUS | Status: DC
  Administered 2021-11-27: 01:00:00 750 mg via INTRAVENOUS
  Filled 2021-11-25: qty 150

## 2021-11-25 MED ORDER — HYDROMORPHONE HCL 1 MG/ML IJ SOLN
0.5000 mg | Freq: Once | INTRAMUSCULAR | Status: AC
  Administered 2021-11-25: 0.5 mg via INTRAVENOUS
  Filled 2021-11-25: qty 1

## 2021-11-25 MED ORDER — ONDANSETRON HCL 4 MG/2ML IJ SOLN: 4.0000 mg | Freq: Four times a day (QID) | INTRAMUSCULAR | Status: DC | PRN

## 2021-11-25 MED ORDER — HALOPERIDOL LACTATE 5 MG/ML IJ SOLN: 5.0000 mg | Freq: Four times a day (QID) | INTRAMUSCULAR | Status: DC | PRN

## 2021-11-25 MED ORDER — LACTATED RINGERS IV BOLUS
1000.0000 mL | Freq: Once | INTRAVENOUS | Status: AC
  Administered 2021-11-25: 1000 mL via INTRAVENOUS

## 2021-11-25 MED ORDER — HYDROXYZINE HCL 10 MG PO TABS
10.0000 mg | ORAL_TABLET | Freq: Three times a day (TID) | ORAL | Status: DC | PRN
  Administered 2021-11-29 – 2021-12-07 (×5): 10 mg via ORAL
  Filled 2021-11-25 (×10): qty 1

## 2021-11-25 MED ORDER — SODIUM CHLORIDE 0.9 % IV SOLN: INTRAVENOUS | Status: AC

## 2021-11-25 MED ORDER — SODIUM CHLORIDE 0.9 % IV SOLN: 1.0000 g | INTRAVENOUS | Status: DC

## 2021-11-25 MED ORDER — ACETAMINOPHEN 650 MG RE SUPP: 650.0000 mg | Freq: Four times a day (QID) | RECTAL | Status: DC | PRN

## 2021-11-25 MED ORDER — ACETAMINOPHEN 325 MG PO TABS
650.0000 mg | ORAL_TABLET | Freq: Four times a day (QID) | ORAL | Status: DC | PRN
  Administered 2021-11-25: 650 mg via ORAL
  Filled 2021-11-25: qty 2

## 2021-11-25 MED ORDER — VANCOMYCIN HCL IN DEXTROSE 1-5 GM/200ML-% IV SOLN
1000.0000 mg | Freq: Once | INTRAVENOUS | Status: AC
  Administered 2021-11-25: 1000 mg via INTRAVENOUS
  Filled 2021-11-25: qty 200

## 2021-11-25 MED ORDER — GABAPENTIN 100 MG PO CAPS
100.0000 mg | ORAL_CAPSULE | Freq: Three times a day (TID) | ORAL | Status: DC
  Administered 2021-11-25 – 2021-11-27 (×7): 100 mg via ORAL
  Filled 2021-11-25 (×7): qty 1

## 2021-11-25 MED ORDER — SENNOSIDES-DOCUSATE SODIUM 8.6-50 MG PO TABS
2.0000 | ORAL_TABLET | Freq: Every day | ORAL | Status: DC
  Administered 2021-11-25 – 2021-12-14 (×15): 2 via ORAL
  Filled 2021-11-25 (×19): qty 2

## 2021-11-25 MED ORDER — TRAMADOL HCL 50 MG PO TABS
50.0000 mg | ORAL_TABLET | Freq: Four times a day (QID) | ORAL | Status: DC | PRN
  Administered 2021-11-25 – 2021-12-14 (×24): 50 mg via ORAL
  Filled 2021-11-25 (×26): qty 1

## 2021-11-25 MED ORDER — ONDANSETRON HCL 4 MG PO TABS: 4.0000 mg | ORAL_TABLET | Freq: Four times a day (QID) | ORAL | Status: DC | PRN

## 2021-11-25 MED ORDER — ACETAMINOPHEN 325 MG PO TABS
650.0000 mg | ORAL_TABLET | Freq: Four times a day (QID) | ORAL | Status: DC | PRN
  Administered 2021-11-25 – 2021-12-03 (×12): 650 mg via ORAL
  Filled 2021-11-25 (×12): qty 2

## 2021-11-25 MED ORDER — ALBUTEROL SULFATE (2.5 MG/3ML) 0.083% IN NEBU: 2.5000 mg | INHALATION_SOLUTION | RESPIRATORY_TRACT | Status: DC | PRN

## 2021-11-25 NOTE — Progress Notes (Addendum)
[facility] PROGRESS NOTE   Sherry Baker:[id number] DOB: Aug 22, 1966 DOA: 11/25/2021  0 DOS: the patient was seen and examined on 11/25/2021  PCP: Pcp, No  Brief History and Hospital Course:  56 y.o. female with medical history significant for polysubstance abuse, including cocaine use as well as alcohol abuse, type I bipolar disorder, central hypertension, generalized anxiety disorder, stage IIIb chronic kidney disease with associated baseline creatinine 1.3-1.6, chronic anemia with baseline hemoglobin 10-12, who was admitted to [facility] on 11/18/2021 with acute metabolic encephalopathy after presenting from home to [facility] ED for evaluation of altered mental status.  Recently hospitalized from December 22 to January 4 for altered mental status.  At that time also she was on Precedex.  She went into respiratory failure had to be intubated.  Supposed to discharge to skilled nursing facility which the patient eventually declined and went home.  Presented back on 1/16 with altered mental status.  Again placed on Precedex infusion in the ICU.  Was found to have maculopapular rash which became pustular.  Etiology was unclear.  Left AMA from the hospital on 11/23/2021.  Patient return to the ER due to swelling of her hand.  There was also concern that the rash on her back could be from monkey Rafael Hernandez.  Patient was hospitalized for further management.  Consultants: Orthopedics.  Infectious disease  Procedures: None    Subjective: Complains of 7 out of 10 pain in the left hand.  No other complaints offered.    Assessment/Plan:  * Cellulitis of left hand- (present on admission) Plain films did not show any air in the soft tissue but soft tissue swelling was noted.  No obvious abscess noted on examination.  Patient started on vancomycin and ceftriaxone.  Pain control.  Discussed with orthopedics who will evaluate.  Keep arm elevated.   Pustular rash- (present on admission) Initially  concern was for bedbugs.  But this is a significantly pustular rash.  Differential diagnosis does include monkey Parks.  ID physician has been consulted.  Continue contact precautions.   Polysubstance abuse ([place])- (present on admission) Patient with known history of IV drug use and polysubstance abuse.  Urine drug screen is pending.  CKD (chronic kidney disease), stage III ([place])- (present on admission) Chronic kidney disease stage IIIb  Renal function noted to be close to baseline.  Monitor urine output.  Avoid nephrotoxic agents.  Hypertension- (present on admission) Blood pressure is reasonably well controlled at this time.  Continue to monitor.  Not on any antihypertensives currently.  Anemia- (present on admission) Chronic for the most part.  Slight drop in hemoglobin is likely dilutional.  No overt bleeding noted.  Continue to monitor.    Hyponatremia Monitor sodium levels.  She was hypernatremic few days ago.  Continue with IV normal saline.  Severe malnutrition ([place])- (present on admission) Encourage oral intake.  GERD (gastroesophageal reflux disease) Chronic.     DVT Prophylaxis: Lovenox Code Status: Full code Family Communication: Discussed with patient Disposition Plan: Hopefully return home in improved  Status is: Observation  The patient will require care spanning > 2 midnights and should be moved to inpatient because: Need for IV antibiotics for left hand cellulitis        Medications: Scheduled:  enoxaparin (LOVENOX) injection  30 mg Subcutaneous Q24H   gabapentin  100 mg Oral TID   pantoprazole  40 mg Oral BID   Continuous:  sodium chloride     [START ON 11/26/2021] cefTRIAXone (ROCEPHIN)  IV     [START ON 11/27/2021] vancomycin     NTI:[id number] **OR** acetaminophen, albuterol, haloperidol lactate, hydrOXYzine, ondansetron **OR** ondansetron (ZOFRAN) IV  Antibiotics: Anti-infectives (From admission, onward)    Start     Dose/Rate Route  Frequency Ordered Stop   11/27/21 0000  vancomycin (VANCOREADY) IVPB 750 mg/150 mL        750 mg 150 mL/hr over 60 Minutes Intravenous Every 48 hours 11/25/21 0422     11/26/21 0400  cefTRIAXone (ROCEPHIN) 1 g in sodium chloride 0.9 % 100 mL IVPB        1 g 200 mL/hr over 30 Minutes Intravenous Every 24 hours 11/25/21 0434 12/03/21 0359   11/25/21 0430  vancomycin (VANCOCIN) IVPB 1000 mg/200 mL premix        1,000 mg 200 mL/hr over 60 Minutes Intravenous  Once 11/25/21 0422 11/25/21 0552   11/25/21 0315  cefTRIAXone (ROCEPHIN) 2 g in sodium chloride 0.9 % 100 mL IVPB        2 g 200 mL/hr over 30 Minutes Intravenous  Once 11/25/21 0311 11/25/21 0435       Objective:  Vital Signs  Vitals:   11/25/21 0453 11/25/21 0548 11/25/21 0556 11/25/21 0759  BP:    136/85  Pulse:  (!) 111 (!) 112 (!) 106  Resp:  19 16 20   Temp: 99.8 F (37.7 C)   98.7 F (37.1 C)  TempSrc: Oral   Oral  SpO2:  100% 98% 99%  Weight:      Height:        Intake/Output Summary (Last 24 hours) at 11/25/2021 0900 Last data filed at 11/25/2021 0618 Gross per 24 hour  Intake 1300 ml  Output --  Net 1300 ml   Filed Weights   11/25/21 0136  Weight: 46 kg    General appearance: Awake alert.  In no distress Resp: Clear to auscultation bilaterally.  Normal effort Cardio: S1-S2 is normal regular.  No S3-S4.  No rubs murmurs or bruit GI: Abdomen is soft.  Nontender nondistended.  Bowel sounds are present normal.  No masses organomegaly.  Ostomy noted.  Appears to be leaking.  Nursing staff was informed Extremities: Left hand noted to be swollen.  Erythema over the dorsal aspect extending over the wrist.  Warm to touch.  Tender to palpate.  Limited range of motion of the wrist.  Cannot make a fist. Skin: Maculopapular rash in various stages noted on the back.  Ranging from pustular to just macular.  Some of the lesions have crusted. Neurologic: Alert and oriented x3.  No focal neurological deficits.    Lab  Results:  Data Reviewed: I have personally reviewed labs and imaging study reports  CBC: Recent Labs  Lab 11/18/21 2235 11/19/21 0333 11/19/21 0336 11/20/21 0142 11/25/21 0158 11/25/21 0450  WBC 5.0  --  3.2* 5.3 12.0* 10.3  NEUTROABS 2.9  --  1.8 3.6 10.1* 8.6*  HGB 9.9* 11.9* 12.4 9.4* 10.0* 9.1*  HCT 31.4* 35.0* 41.3 30.3* 32.2* 28.0*  MCV 90.8  --  94.3 91.8 91.5 89.7  PLT 289  --  257 253 196 195    Basic Metabolic Panel: Recent Labs  Lab 11/19/21 0336 11/19/21 1217 11/20/21 0142 11/20/21 1301 11/21/21 0131 11/22/21 0340 11/23/21 0600 11/25/21 0158  NA 146*   < > 150* 142 141 135 137 130*  K 3.8   < > 3.6 4.2 3.5 3.6 4.0 4.2  CL 116*   < > 117* 112* 113*  109 107 101  CO2 16*   < > 16* 19* 21* 20* 19* 17*  GLUCOSE 73   < > 92 122* 108* 107* 106* 125*  BUN 47*   < > 42* 39* 34* 28* 26* 22*  CREATININE 1.63*   < > 1.60* 1.66* 1.69* 1.73* 1.62* 1.72*  CALCIUM 9.6   < > 9.8 9.7 9.6 8.2* 9.0 8.5*  MG 2.2  --  1.9  --  2.0 1.7  --  2.0  PHOS  --   --  3.6  --   --   --   --   --    < > = values in this interval not displayed.    GFR: Estimated Creatinine Clearance: 27.2 mL/min (A) (by C-G formula based on SCr of 1.72 mg/dL (H)).  Liver Function Tests: Recent Labs  Lab 11/18/21 2235 11/19/21 0336 11/20/21 0142 11/25/21 0158  AST 26 31 30 17   ALT 19 21 22 15   ALKPHOS 58 66 59 68  BILITOT 0.3 0.4 0.8 0.3  PROT 7.3 7.6 7.2 7.8  ALBUMIN 3.0* 3.0* 3.0* 2.9*    Recent Labs  Lab 11/18/21 2237  AMMONIA 20    Coagulation Profile: Recent Labs  Lab 11/18/21 2235 11/19/21 0336 11/25/21 0158  INR 1.0 1.0 1.2    Cardiac Enzymes: Recent Labs  Lab 11/19/21 0338 11/20/21 0142  CKTOTAL 325* 226     CBG: Recent Labs  Lab 11/21/21 2347 11/22/21 0353 11/22/21 0803 11/22/21 1111 11/22/21 2009  GLUCAP 97 102* 83 118* 104*     Recent Results (from the past 240 hour(s))  Resp Panel by RT-PCR (Flu A&B, Covid) Nasopharyngeal Swab     Status: None    Collection Time: 11/18/21  7:10 PM   Specimen: Nasopharyngeal Swab; Nasopharyngeal(NP) swabs in vial transport medium  Result Value Ref Range Status   SARS Coronavirus 2 by RT PCR NEGATIVE NEGATIVE Final    Comment: (NOTE) SARS-CoV-2 target nucleic acids are NOT DETECTED.  The SARS-CoV-2 RNA is generally detectable in upper respiratory specimens during the acute phase of infection. The lowest concentration of SARS-CoV-2 viral copies this assay can detect is 138 copies/mL. A negative result does not preclude SARS-Cov-2 infection and should not be used as the sole basis for treatment or other patient management decisions. A negative result may occur with  improper specimen collection/handling, submission of specimen other than nasopharyngeal swab, presence of viral mutation(s) within the areas targeted by this assay, and inadequate number of viral copies(<138 copies/mL). A negative result must be combined with clinical observations, patient history, and epidemiological information. The expected result is Negative.  Fact Sheet for Patients:  EntrepreneurPulse.com.au  Fact Sheet for Healthcare Providers:  IncredibleEmployment.be  This test is no t yet approved or cleared by the [place] FDA and  has been authorized for detection and/or diagnosis of SARS-CoV-2 by FDA under an Emergency Use Authorization (EUA). This EUA will remain  in effect (meaning this test can be used) for the duration of the COVID-19 declaration under Section 564(b)(1) of the Act, 21 U.S.C.section 360bbb-3(b)(1), unless the authorization is terminated  or revoked sooner.       Influenza A by PCR NEGATIVE NEGATIVE Final   Influenza B by PCR NEGATIVE NEGATIVE Final    Comment: (NOTE) The Xpert Xpress SARS-CoV-2/FLU/RSV plus assay is intended as an aid in the diagnosis of influenza from Nasopharyngeal swab specimens and should not be used as a sole basis for treatment.  Nasal washings and  aspirates are unacceptable for Xpert Xpress SARS-CoV-2/FLU/RSV testing.  Fact Sheet for Patients: EntrepreneurPulse.com.au  Fact Sheet for Healthcare Providers: IncredibleEmployment.be  This test is not yet approved or cleared by the [place] FDA and has been authorized for detection and/or diagnosis of SARS-CoV-2 by FDA under an Emergency Use Authorization (EUA). This EUA will remain in effect (meaning this test can be used) for the duration of the COVID-19 declaration under Section 564(b)(1) of the Act, 21 U.S.C. section 360bbb-3(b)(1), unless the authorization is terminated or revoked.  Performed at [facility], [place] [address], [place], [place] [address]   Resp Panel by RT-PCR (Flu A&B, Covid) Nasopharyngeal Swab     Status: None   Collection Time: 11/25/21  4:20 AM   Specimen: Nasopharyngeal Swab; Nasopharyngeal(NP) swabs in vial transport medium  Result Value Ref Range Status   SARS Coronavirus 2 by RT PCR NEGATIVE NEGATIVE Final    Comment: (NOTE) SARS-CoV-2 target nucleic acids are NOT DETECTED.  The SARS-CoV-2 RNA is generally detectable in upper respiratory specimens during the acute phase of infection. The lowest concentration of SARS-CoV-2 viral copies this assay can detect is 138 copies/mL. A negative result does not preclude SARS-Cov-2 infection and should not be used as the sole basis for treatment or other patient management decisions. A negative result may occur with  improper specimen collection/handling, submission of specimen other than nasopharyngeal swab, presence of viral mutation(s) within the areas targeted by this assay, and inadequate number of viral copies(<138 copies/mL). A negative result must be combined with clinical observations, patient history, and epidemiological information. The expected result is Negative.  Fact Sheet for Patients:   EntrepreneurPulse.com.au  Fact Sheet for Healthcare Providers:  IncredibleEmployment.be  This test is no t yet approved or cleared by the [place] FDA and  has been authorized for detection and/or diagnosis of SARS-CoV-2 by FDA under an Emergency Use Authorization (EUA). This EUA will remain  in effect (meaning this test can be used) for the duration of the COVID-19 declaration under Section 564(b)(1) of the Act, 21 U.S.C.section 360bbb-3(b)(1), unless the authorization is terminated  or revoked sooner.       Influenza A by PCR NEGATIVE NEGATIVE Final   Influenza B by PCR NEGATIVE NEGATIVE Final    Comment: (NOTE) The Xpert Xpress SARS-CoV-2/FLU/RSV plus assay is intended as an aid in the diagnosis of influenza from Nasopharyngeal swab specimens and should not be used as a sole basis for treatment. Nasal washings and aspirates are unacceptable for Xpert Xpress SARS-CoV-2/FLU/RSV testing.  Fact Sheet for Patients: EntrepreneurPulse.com.au  Fact Sheet for Healthcare Providers: IncredibleEmployment.be  This test is not yet approved or cleared by the [place] FDA and has been authorized for detection and/or diagnosis of SARS-CoV-2 by FDA under an Emergency Use Authorization (EUA). This EUA will remain in effect (meaning this test can be used) for the duration of the COVID-19 declaration under Section 564(b)(1) of the Act, 21 U.S.C. section 360bbb-3(b)(1), unless the authorization is terminated or revoked.  Performed at [facility], [place] [address], [place], [place] [address]       Radiology Studies: DG Chest 2 View  Result Date: 11/25/2021 CLINICAL DATA:  Chest pain. EXAM: CHEST - 2 VIEW COMPARISON:  Radiograph dated 11/20/2021. FINDINGS: Minimal bibasilar atelectasis. No focal consolidation, pleural effusion, pneumothorax. The cardiac silhouette is within normal limits. No acute osseous  pathology. IMPRESSION: No active cardiopulmonary disease. Electronically Signed   By: Anner Crete M.D.   On: 11/25/2021  03:00   DG Hand Complete Left  Result Date: 11/25/2021 CLINICAL DATA:  Swelling and inflammation in the anterior aspect of the second and third metatarsals. EXAM: LEFT HAND - COMPLETE 3+ VIEW COMPARISON:  None. FINDINGS: There is no evidence of fracture or dislocation. Degenerative changes are noted at the wrist. Soft tissue swelling is present over the dorsum of the hand and digits. Vascular calcifications are present in the soft tissues. IMPRESSION: Diffuse soft tissue swelling over the dorsum of the hand and digits. No acute osseous abnormality. Electronically Signed   By: Brett Fairy M.D.   On: 11/25/2021 03:03       LOS: 0 days   [facility] Pager on [email or web address]  11/25/2021, 9:00 AM

## 2021-11-25 NOTE — Consult Note (Signed)
Reason for Consult:Left hand cellulitis Referring Physician: Bonnielee Haff Time called: 0845 Time at bedside: [place] is an 56 y.o. female.  HPI: Aavya has been in and out of the hospital for the past month, mostly 2/2 encephalopathy related to drug use. She left the hospital yesterday AMA but returns today. She has swelling and redness of her hand which she says has been going on for 2 weeks though there's no mention in the chart of this. She is RHD.  Past Medical History:  Diagnosis Date   Alcohol abuse    Allergy    PCNS swelling   Arthritis    Bipolar 1 disorder ([place])    Cancer ([place]) 01/21/2017   rectal cancer   Cancer ([place])    Cirrhosis of liver ([place])    COVID-19 virus infection 06/23/2021   Delirium tremens ([place]) 06/22/2021   Depression    Genetic testing 03/24/2017   Ms. Mapp underwent genetic counseling and testing for hereditary cancer syndromes on 02/17/2017. Her results were negative for mutations in all 46 genes analyzed by [facility]'s 46-gene Common Hereditary Cancers Panel. Genes analyzed include: APC, ATM, AXIN2, BARD1, BMPR1A, BRCA1, BRCA2, BRIP1, CDH1, CDKN2A, CHEK2, CTNNA1, DICER1, EPCAM, GREM1, HOXB13, KIT, MEN1, MLH1, MSH2, MSH3, MSH6, MUTYH, NBN,   Hypertension    Suicidal ideation 02/11/2014    Past Surgical History:  Procedure Laterality Date   ABDOMINAL PERINEAL BOWEL RESECTION N/A 06/18/2017   Procedure: ABDOMINAL PERINEAL RESECTION ERAS PATHWAY;  Surgeon: Leighton Ruff, MD;  Location: [facility];  Service: General;  Laterality: N/A;   COLON SURGERY     COLONOSCOPY WITH PROPOFOL Left 01/21/2017   Procedure: COLONOSCOPY WITH PROPOFOL;  Surgeon: Otis Brace, MD;  Location: [facility];  Service: Gastroenterology;  Laterality: Left;   FRACTURE SURGERY     MANDIBLE FRACTURE SURGERY      Family History  Problem Relation Age of Onset   Cancer Mother 41       Originating in the abdomen, otherwise unknwon   Cancer - Other Maternal Aunt         Throat    Social History:  reports that she has been smoking cigarettes. She has a 19.50 pack-year smoking history. She has never used smokeless tobacco. She reports current alcohol use. She reports current drug use. Drugs: "Crack" cocaine and Cocaine.  Allergies:  Allergies  Allergen Reactions   Penicillins Hives and Swelling    Has patient had a PCN reaction causing immediate rash, facial/tongue/throat swelling, SOB or lightheadedness with hypotension: Yes Has patient had a PCN reaction causing severe rash involving mucus membranes or skin necrosis: No Has patient had a PCN reaction that required hospitalization No Has patient had a PCN reaction occurring within the last 10 years: Yes If all of the above answers are "NO", then may proceed with Cephalosporin use.     Medications: I have reviewed the patient's current medications.  Results for orders placed or performed during the hospital encounter of 11/25/21 (from the past 48 hour(s))  Comprehensive metabolic panel     Status: Abnormal   Collection Time: 11/25/21  1:58 AM  Result Value Ref Range   Sodium 130 (L) 135 - 145 mmol/L    Comment: DELTA CHECK NOTED   Potassium 4.2 3.5 - 5.1 mmol/L   Chloride 101 98 - 111 mmol/L   CO2 17 (L) 22 - 32 mmol/L   Glucose, Bld 125 (H) 70 - 99 mg/dL    Comment: Glucose reference range applies  only to samples taken after fasting for at least 8 hours.   BUN 22 (H) 6 - 20 mg/dL   Creatinine, Ser 1.72 (H) 0.44 - 1.00 mg/dL   Calcium 8.5 (L) 8.9 - 10.3 mg/dL   Total Protein 7.8 6.5 - 8.1 g/dL   Albumin 2.9 (L) 3.5 - 5.0 g/dL   AST 17 15 - 41 U/L   ALT 15 0 - 44 U/L   Alkaline Phosphatase 68 38 - 126 U/L   Total Bilirubin 0.3 0.3 - 1.2 mg/dL   GFR, Estimated 35 (L) >60 mL/min    Comment: (NOTE) Calculated using the CKD-EPI Creatinine Equation (2021)    Anion gap 12 5 - 15    Comment: Performed at [facility], [place] [address] [place], [place] [address]  Lactic acid, plasma      Status: None   Collection Time: 11/25/21  1:58 AM  Result Value Ref Range   Lactic Acid, Venous 1.8 0.5 - 1.9 mmol/L    Comment: Performed at [facility] [address] [place], [place] [address]  CBC with Differential     Status: Abnormal   Collection Time: 11/25/21  1:58 AM  Result Value Ref Range   WBC 12.0 (H) 4.0 - 10.5 K/uL   RBC 3.52 (L) 3.87 - 5.11 MIL/uL   Hemoglobin 10.0 (L) 12.0 - 15.0 g/dL   HCT 32.2 (L) 36.0 - 46.0 %   MCV 91.5 80.0 - 100.0 fL   MCH 28.4 26.0 - 34.0 pg   MCHC 31.1 30.0 - 36.0 g/dL   RDW 18.2 (H) 11.5 - 15.5 %   Platelets 196 150 - 400 K/uL   nRBC 0.0 0.0 - 0.2 %   Neutrophils Relative % 84 %   Neutro Abs 10.1 (H) 1.7 - 7.7 K/uL   Lymphocytes Relative 5 %   Lymphs Abs 0.6 (L) 0.7 - 4.0 K/uL   Monocytes Relative 9 %   Monocytes Absolute 1.1 (H) 0.1 - 1.0 K/uL   Eosinophils Relative 1 %   Eosinophils Absolute 0.1 0.0 - 0.5 K/uL   Basophils Relative 0 %   Basophils Absolute 0.0 0.0 - 0.1 K/uL   Immature Granulocytes 1 %   Abs Immature Granulocytes 0.17 (H) 0.00 - 0.07 K/uL    Comment: Performed at [facility] [address], [place], [place] [address]  Protime-INR     Status: None   Collection Time: 11/25/21  1:58 AM  Result Value Ref Range   Prothrombin Time 15.1 11.4 - 15.2 seconds   INR 1.2 0.8 - 1.2    Comment: (NOTE) INR goal varies based on device and disease states. Performed at [facility], [place] [address], [place], [place] [address]   Ethanol     Status: None   Collection Time: 11/25/21  1:58 AM  Result Value Ref Range   Alcohol, Ethyl (B) <10 <10 mg/dL    Comment: (NOTE) Lowest detectable limit for serum alcohol is 10 mg/dL.  For medical purposes only. Performed at [facility], [place] [address], [place], [place] [address]   Magnesium     Status: None   Collection Time: 11/25/21  1:58 AM  Result Value Ref Range   Magnesium 2.0 1.7 - 2.4 mg/dL    Comment: Performed at [facility]  [address], [place], [place] [address]  I-Stat beta hCG blood, ED     Status: Abnormal   Collection Time: 11/25/21  2:16 AM  Result Value Ref Range   I-stat hCG, quantitative 6.1 (H) <5 mIU/mL   Comment 3            Comment:   GEST. AGE      CONC.  (mIU/mL)   <=1 WEEK        5 - 50     2 WEEKS       50 - 500     3 WEEKS       100 - 10,000     4 WEEKS     1,000 - 30,000        FEMALE AND NON-PREGNANT FEMALE:     LESS THAN 5 mIU/mL   Lactic acid, plasma     Status: None   Collection Time: 11/25/21  3:50 AM  Result Value Ref Range   Lactic Acid, Venous 0.9 0.5 - 1.9 mmol/L    Comment: Performed at [facility], [place] [address] [place], [place] [address]  Resp Panel by RT-PCR (Flu A&B, Covid) Nasopharyngeal Swab     Status: None   Collection Time: 11/25/21  4:20 AM   Specimen: Nasopharyngeal Swab; Nasopharyngeal(NP) swabs in vial transport medium  Result Value Ref Range   SARS Coronavirus 2 by RT PCR NEGATIVE NEGATIVE    Comment: (NOTE) SARS-CoV-2 target nucleic acids are NOT DETECTED.  The SARS-CoV-2 RNA is generally detectable in upper respiratory specimens during the acute phase of infection. The lowest concentration of SARS-CoV-2 viral copies this assay can detect is 138 copies/mL. A negative result does not preclude SARS-Cov-2 infection and should not be used as the sole basis for treatment or other patient management decisions. A negative result may occur with  improper specimen collection/handling, submission of specimen other than nasopharyngeal swab, presence of viral mutation(s) within the areas targeted by this assay, and inadequate number of viral copies(<138 copies/mL). A negative result must be combined with clinical observations, patient history, and epidemiological information. The expected result is Negative.  Fact Sheet for Patients:  EntrepreneurPulse.com.au  Fact Sheet for Healthcare Providers:  IncredibleEmployment.be  This  test is no t yet approved or cleared by the [place] FDA and  has been authorized for detection and/or diagnosis of SARS-CoV-2 by FDA under an Emergency Use Authorization (EUA). This EUA will remain  in effect (meaning this test can be used) for the duration of the COVID-19 declaration under Section 564(b)(1) of the Act, 21 U.S.C.section 360bbb-3(b)(1), unless the authorization is terminated  or revoked sooner.       Influenza A by PCR NEGATIVE NEGATIVE   Influenza B by PCR NEGATIVE NEGATIVE    Comment: (NOTE) The Xpert Xpress SARS-CoV-2/FLU/RSV plus assay is intended as an aid in the diagnosis of influenza from Nasopharyngeal swab specimens and should not be used as a sole basis for treatment. Nasal washings and aspirates are unacceptable for Xpert Xpress SARS-CoV-2/FLU/RSV testing.  Fact Sheet for Patients: EntrepreneurPulse.com.au  Fact Sheet for Healthcare Providers: IncredibleEmployment.be  This test is not yet approved or cleared by the [place] FDA and has been authorized for detection and/or diagnosis of SARS-CoV-2 by FDA under an Emergency Use Authorization (EUA). This EUA will remain in effect (meaning this test can be used) for the duration of the COVID-19 declaration under Section 564(b)(1) of the Act, 21 U.S.C. section 360bbb-3(b)(1), unless the authorization is terminated or revoked.  Performed at [facility], [place] [address] [place], [place] [address]   CBC with Differential/Platelet  Status: Abnormal   Collection Time: 11/25/21  4:50 AM  Result Value Ref Range   WBC 10.3 4.0 - 10.5 K/uL   RBC 3.12 (L) 3.87 - 5.11 MIL/uL   Hemoglobin 9.1 (L) 12.0 - 15.0 g/dL   HCT 28.0 (L) 36.0 - 46.0 %   MCV 89.7 80.0 - 100.0 fL   MCH 29.2 26.0 - 34.0 pg   MCHC 32.5 30.0 - 36.0 g/dL   RDW 18.2 (H) 11.5 - 15.5 %   Platelets 159 150 - 400 K/uL   nRBC 0.0 0.0 - 0.2 %   Neutrophils Relative % 84 %   Neutro Abs 8.6 (H)  1.7 - 7.7 K/uL   Lymphocytes Relative 5 %   Lymphs Abs 0.6 (L) 0.7 - 4.0 K/uL   Monocytes Relative 9 %   Monocytes Absolute 0.9 0.1 - 1.0 K/uL   Eosinophils Relative 1 %   Eosinophils Absolute 0.1 0.0 - 0.5 K/uL   Basophils Relative 0 %   Basophils Absolute 0.0 0.0 - 0.1 K/uL   Immature Granulocytes 1 %   Abs Immature Granulocytes 0.11 (H) 0.00 - 0.07 K/uL    Comment: Performed at [facility], [address], [place], [place] [address]    DG Chest 2 View  Result Date: 11/25/2021 CLINICAL DATA:  Chest pain. EXAM: CHEST - 2 VIEW COMPARISON:  Radiograph dated 11/20/2021. FINDINGS: Minimal bibasilar atelectasis. No focal consolidation, pleural effusion, pneumothorax. The cardiac silhouette is within normal limits. No acute osseous pathology. IMPRESSION: No active cardiopulmonary disease. Electronically Signed   By: Anner Crete M.D.   On: 11/25/2021 03:00   DG Hand Complete Left  Result Date: 11/25/2021 CLINICAL DATA:  Swelling and inflammation in the anterior aspect of the second and third metatarsals. EXAM: LEFT HAND - COMPLETE 3+ VIEW COMPARISON:  None. FINDINGS: There is no evidence of fracture or dislocation. Degenerative changes are noted at the wrist. Soft tissue swelling is present over the dorsum of the hand and digits. Vascular calcifications are present in the soft tissues. IMPRESSION: Diffuse soft tissue swelling over the dorsum of the hand and digits. No acute osseous abnormality. Electronically Signed   By: Brett Fairy M.D.   On: 11/25/2021 03:03    Review of Systems  Constitutional:  Negative for chills, diaphoresis and fever.  HENT:  Negative for ear discharge, ear pain, hearing loss and tinnitus.   Eyes:  Negative for photophobia and pain.  Respiratory:  Negative for cough and shortness of breath.   Cardiovascular:  Negative for chest pain.  Gastrointestinal:  Negative for abdominal pain, nausea and vomiting.  Genitourinary:  Negative for dysuria, flank pain,  frequency and urgency.  Musculoskeletal:  Positive for arthralgias (Left hand). Negative for back pain, myalgias and neck pain.  Neurological:  Negative for dizziness and headaches.  Hematological:  Does not bruise/bleed easily.  Psychiatric/Behavioral:  The patient is not nervous/anxious.   Blood pressure 136/85, pulse (!) 106, temperature 98.7 F (37.1 C), temperature source Oral, resp. rate 20, height '5\' 2"'  (1.575 m), weight 46 kg, SpO2 99 %. Physical Exam Constitutional:      General: She is not in acute distress.    Appearance: She is well-developed. She is not diaphoretic.  HENT:     Head: Normocephalic and atraumatic.  Eyes:     General: No scleral icterus.       Right eye: No discharge.        Left eye: No discharge.     Conjunctiva/sclera:  Conjunctivae normal.  Cardiovascular:     Rate and Rhythm: Normal rate and regular rhythm.  Pulmonary:     Effort: Pulmonary effort is normal. No respiratory distress.  Musculoskeletal:     Cervical back: Normal range of motion.     Comments: Left shoulder, elbow, wrist, digits- Radial aspect of dorsal hand mod edema/erythema, mod TTP, no fluctuance, no palmar TTP, no pain with passive extension of fingers, about 45 degrees of painless wrist motion, no instability, no blocks to motion  Sens  Ax/R/M/U intact  Mot   Ax/ R/ PIN/ M/ AIN/ U intact  Rad 2+  Skin:    General: Skin is warm and dry.  Neurological:     Mental Status: She is alert.  Psychiatric:        Mood and Affect: Mood normal.        Behavior: Behavior normal.    Assessment/Plan: Left hand cellulitis -- Her exam is reassuring that there is no joint involvement or large abscess. Since we cannot get any advanced imaging with contrast would recommend IV abx and we'll see how she responds over the next 48h.    Lisette Abu, PA-C Orthopedic Surgery [phone] 11/25/2021, 9:14 AM

## 2021-11-25 NOTE — Assessment & Plan Note (Addendum)
Seen by ostomy nurse.

## 2021-11-25 NOTE — Assessment & Plan Note (Addendum)
Encourage oral intake. Continue Ensure supplementation as well as mineral replacement Estimated body mass index is 18 kg/m as calculated from the following:   Height as of this encounter: 5\' 2"  (1.575 m).   Weight as of this encounter: 44.6 kg.

## 2021-11-25 NOTE — Progress Notes (Signed)
Pharmacy Antibiotic Note  Sherry Baker is a 56 y.o. female admitted on 11/25/2021 with cellulitis.  Pharmacy has been consulted for vancomycin dosing.  Plan: Vancomycin 1000mg  x1 then 750mg  IV Q48H. Goal AUC 400-550.  Expected AUC 420.  SCr 1.72.   Height: 5\' 2"  (157.5 cm) Weight: 46 kg (101 lb 6.6 oz) IBW/kg (Calculated) : 50.1  Temp (24hrs), Avg:101.9 F (38.8 C), Min:101.9 F (38.8 C), Max:101.9 F (38.8 C)  Recent Labs  Lab 11/18/21 2235 11/19/21 0336 11/19/21 1217 11/20/21 0142 11/20/21 1301 11/21/21 0131 11/22/21 0340 11/23/21 0600 11/25/21 0158  WBC 5.0 3.2*  --  5.3  --   --   --   --  12.0*  CREATININE 1.85* 1.63* 1.87* 1.60* 1.66* 1.69* 1.73* 1.62* 1.72*  LATICACIDVEN  --   --  1.5  --   --   --   --   --  1.8    Estimated Creatinine Clearance: 27.2 mL/min (A) (by C-G formula based on SCr of 1.72 mg/dL (H)).    Allergies  Allergen Reactions   Penicillins Hives and Swelling    Has patient had a PCN reaction causing immediate rash, facial/tongue/throat swelling, SOB or lightheadedness with hypotension: Yes Has patient had a PCN reaction causing severe rash involving mucus membranes or skin necrosis: No Has patient had a PCN reaction that required hospitalization No Has patient had a PCN reaction occurring within the last 10 years: Yes If all of the above answers are "NO", then may proceed with Cephalosporin use.     Thank you for allowing pharmacy to be a part of this patients care.  Wynona Neat, PharmD, BCPS  11/25/2021 4:21 AM

## 2021-11-25 NOTE — ED Notes (Addendum)
Patient transported to X-ray 

## 2021-11-25 NOTE — Assessment & Plan Note (Addendum)
Chronic kidney disease stage IIIb Hyponatremia Renal function at usual baseline Sodium stable between 134 and 135 2/8 BUN 41,  creatinine of 1.39

## 2021-11-25 NOTE — ED Notes (Signed)
Breakfast order placed ?

## 2021-11-25 NOTE — Assessment & Plan Note (Addendum)
Chronic. Continue PPI after discharge

## 2021-11-25 NOTE — Consult Note (Addendum)
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   Rash to back, thought to be from bedbug infestation, also ruling out monkey pox.  LLQ colostomy, leaking Wound type:pressure and inflammatory to back from insect infestation (bedbugs)  Pressure Injury POA: Yes Measurement: sacrum:  5 cm x 1.4 cm x 0.5 cm  Generalized rash to back Deep erythema to left hand, cellulitis, on vancomycin Wound bed:red and moist Drainage (amount, consistency, odor) moderate serosanguinous no odor Periwound:erythema and edema to left hand Complaining of leg pain and thrashing in bed.  ASking to go home.  Impaired insight and judgement into her condition.  Dressing procedure/placement/frequency: cleanse sacral wound with NS and pat dry. Fill dead space to wound with alginate dressing (([id number] [id number])  cover with sacral foam. Change every other day. Encourage to turn and reposition to redistribute pressure. Hydrocortisone ordered to back erythematous rash and IV rocephin ordered.  Ruling out monkey pox.  LEft hand cellulitis , likely fromIV drug use, injection site to left dorsal hand noted. Will cover with Xeroform gauze daily.    [facility] Nurse ostomy consult note Stoma type/location: LLQ colostomy, leaking Stomal assessment/size: 1" well budded.  Parastomal hernia present with bulge noted from 12 to 6 o'clock.  Wears 1 piece flat pouch with barrier ring. States she gets supplies from [facility] and has sufficient supplies at home, which may be a homeless shelter.   Peristomal assessment: parastomal hernia, otherwise intact Treatment options for stomal/peristomal skin: barrier ring and 1 piece flat pouch.  Output soft brown stool.  Pouch was completely full, likely causing it to dislodge and leak.  Will need to assist patient to empty when 1/3 full.   Ostomy pouching: 1pc. Flat with barrier ring Education provided: none Enrolled patient in [place] program: Yes previously Will not follow at this time.  Please re-consult if needed.  Domenic Moras MSN, RN, FNP-BC CWON Wound, Ostomy, Continence Nurse Pager [phone]

## 2021-11-25 NOTE — Assessment & Plan Note (Addendum)
-  stable low -continue to monitor

## 2021-11-25 NOTE — H&P (Signed)
History and Physical    Sherry Baker LPF:[id number] DOB: 14-Apr-1966 DOA: 11/25/2021  PCP: Pcp, No   Patient coming from:  unknown, pt is homeless.  I have personally briefly reviewed patient's old medical records in [place]  CC: left hand swelling HPI: 56 year old female with a history of polysubstance abuse, history of rectal cancer, malnutrition who left AMA from the hospital yesterday on 11/23/2021.  Patient had spent approximately 4 weeks in the hospital including stay in the ICU and being intubated.  Patient return to the ER due to swelling of her hand.  EDP also concerned the patient may have contracted monkey proximal.  There is a picture while she was in the ICU from January 18 of a pustular rash on her back.  This is gotten progressively worse.  Pustules are now purulent.  Patient is evasive will not give any answers to questions.  [facility] hospitalist contacted for admission.   ED Course: start on IV rocephin, vanco. EDP concerned about monkey pox.  Review of Systems:  Review of Systems  Constitutional: Negative.   HENT: Negative.    Eyes: Negative.   Respiratory: Negative.    Cardiovascular: Negative.   Gastrointestinal: Negative.   Genitourinary: Negative.   Musculoskeletal:  Positive for back pain and neck pain.  Skin:        Pain, swelling, redness in left hand.  Neurological: Negative.   Endo/Heme/Allergies: Negative.   Psychiatric/Behavioral:  Positive for substance abuse.   All other systems reviewed and are negative.  Past Medical History:  Diagnosis Date   Alcohol abuse    Allergy    PCNS swelling   Arthritis    Bipolar 1 disorder ([place])    Cancer ([place]) 01/21/2017   rectal cancer   Cancer ([place])    Cirrhosis of liver ([place])    COVID-19 virus infection 06/23/2021   Delirium tremens ([place]) 06/22/2021   Depression    Genetic testing 03/24/2017   Ms. [name] underwent genetic counseling and testing for hereditary cancer syndromes on 02/17/2017. Her  results were negative for mutations in all 46 genes analyzed by [facility]'s 46-gene Common Hereditary Cancers Panel. Genes analyzed include: APC, ATM, AXIN2, BARD1, BMPR1A, BRCA1, BRCA2, BRIP1, CDH1, CDKN2A, CHEK2, CTNNA1, DICER1, EPCAM, GREM1, HOXB13, KIT, MEN1, MLH1, MSH2, MSH3, MSH6, MUTYH, NBN,   Hypertension    Suicidal ideation 02/11/2014    Past Surgical History:  Procedure Laterality Date   ABDOMINAL PERINEAL BOWEL RESECTION N/A 06/18/2017   Procedure: ABDOMINAL PERINEAL RESECTION ERAS PATHWAY;  Surgeon: Leighton Ruff, MD;  Location: [facility];  Service: General;  Laterality: N/A;   COLON SURGERY     COLONOSCOPY WITH PROPOFOL Left 01/21/2017   Procedure: COLONOSCOPY WITH PROPOFOL;  Surgeon: Otis Brace, MD;  Location: [facility];  Service: Gastroenterology;  Laterality: Left;   FRACTURE SURGERY     MANDIBLE FRACTURE SURGERY       reports that she has been smoking cigarettes. She has a 19.50 pack-year smoking history. She has never used smokeless tobacco. She reports current alcohol use. She reports current drug use. Drugs: "Crack" cocaine and Cocaine.  No Known Allergies  Family History  Problem Relation Age of Onset   Cancer Mother 10       Originating in the abdomen, otherwise unknwon   Cancer - Other Maternal Aunt        Throat    Prior to Admission medications   Medication Sig Start Date End Date Taking? Authorizing Provider  acetaminophen (TYLENOL) 325 MG tablet  Take 2 tablets (650 mg total) by mouth every 6 (six) hours as needed for mild pain, headache or fever. 09/30/21   Samella Parr, NP  amLODipine (NORVASC) 10 MG tablet Take 1 tablet (10 mg total) by mouth daily. 11/16/21   Shelly Coss, MD  busPIRone (BUSPAR) 15 MG tablet Take 1 tablet (15 mg total) by mouth 2 (two) times daily. 11/16/21   Shelly Coss, MD  carvedilol (COREG) 3.125 MG tablet Take 1 tablet (3.125 mg total) by mouth 2 (two) times daily with a meal. 11/16/21   Shelly Coss, MD  citalopram  (CELEXA) 10 MG tablet Take 1 tablet (10 mg total) by mouth daily. 11/16/21   Shelly Coss, MD  diclofenac Sodium (VOLTAREN) 1 % GEL Apply 4 g topically 4 (four) times daily. 09/30/21   Samella Parr, NP  folic acid (FOLVITE) 1 MG tablet Take 1 tablet (1 mg total) by mouth daily. 11/17/21   Shelly Coss, MD  gabapentin (NEURONTIN) 100 MG capsule Take 1 capsule (100 mg total) by mouth 3 (three) times daily. 11/16/21   Shelly Coss, MD  hydrOXYzine (ATARAX/VISTARIL) 10 MG tablet Take 1 tablet (10 mg total) by mouth 3 (three) times daily as needed. As needed for acute anxiety 11/15/20   Argentina Donovan, PA-C  lactulose (CHRONULAC) 10 GM/15ML solution Take 15 mLs (10 g total) by mouth 2 (two) times daily. 11/16/21   Shelly Coss, MD  Multiple Vitamin (MULTIVITAMIN WITH MINERALS) TABS tablet Place 1 tablet into feeding tube daily. Patient taking differently: Take 1 tablet by mouth daily. 04/09/21   Pokhrel, Corrie Mckusick, MD  nicotine (NICODERM CQ - DOSED IN MG/24 HOURS) 21 mg/24hr patch Place 1 patch (21 mg total) onto the skin daily. 11/17/21   Shelly Coss, MD  pantoprazole (PROTONIX) 40 MG tablet Take 1 tablet (40 mg total) by mouth 2 (two) times daily. 11/16/21 11/16/22  Shelly Coss, MD  thiamine 100 MG tablet Take 1 tablet (100 mg total) by mouth daily. 11/16/21   Shelly Coss, MD  traMADol (ULTRAM) 50 MG tablet Take 1 tablet (50 mg total) by mouth every 6 (six) hours as needed for moderate pain. 09/30/21   Samella Parr, NP  Vitamin D3 (VITAMIN D) 25 MCG tablet Take 1 tablet (1,000 Units total) by mouth daily. 09/30/21   Samella Parr, NP  ziprasidone (GEODON) 40 MG capsule Take 1 capsule (40 mg total) by mouth 2 (two) times daily with a meal. 11/06/11 01/09/12  Daleen Bo, MD    Physical Exam: Vitals:   11/25/21 0136 11/25/21 0140 11/25/21 0300 11/25/21 0315  BP:  120/74 122/90 112/79  Pulse:  (!) 120 (!) 110 (!) 114  Resp:  17 19 (!) 23  Temp:  (!) 101.9 F (38.8 C)     TempSrc:  Oral    SpO2:  100% 100% 100%  Weight: 46 kg     Height: '5\' 2"'  (1.575 m)       Physical Exam Vitals and nursing note reviewed.  Constitutional:      General: She is not in acute distress.    Appearance: She is ill-appearing. She is not toxic-appearing.     Comments: Disheveled, dirty, chronically ill-appearing female appearing older than stated age.  HENT:     Head: Normocephalic and atraumatic.     Nose: Nose normal. No rhinorrhea.  Cardiovascular:     Rate and Rhythm: Normal rate and regular rhythm.  Pulmonary:     Effort: Pulmonary effort is normal. No  respiratory distress.     Breath sounds: No wheezing.  Abdominal:     General: Bowel sounds are normal. There is no distension.     Tenderness: There is no guarding or rebound.  Musculoskeletal:     Right lower leg: No edema.     Left lower leg: No edema.  Skin:    Capillary Refill: Capillary refill takes less than 2 seconds.     Comments: Multiple purulent pustular lesions on her back.  See picture.  Dorsum left hand is erythematous.  It appears to be to track marks on the dorsum of the left hand.  Left hand is swollen and erythematous.  Neurological:     Mental Status: She is alert and oriented to person, place, and time.     Labs on Admission: I have personally reviewed following labs and imaging studies  CBC: Recent Labs  Lab 11/18/21 2235 11/19/21 0333 11/19/21 0336 11/20/21 0142 11/25/21 0158  WBC 5.0  --  3.2* 5.3 12.0*  NEUTROABS 2.9  --  1.8 3.6 10.1*  HGB 9.9* 11.9* 12.4 9.4* 10.0*  HCT 31.4* 35.0* 41.3 30.3* 32.2*  MCV 90.8  --  94.3 91.8 91.5  PLT 289  --  257 253 481   Basic Metabolic Panel: Recent Labs  Lab 11/19/21 0336 11/19/21 1217 11/20/21 0142 11/20/21 1301 11/21/21 0131 11/22/21 0340 11/23/21 0600 11/25/21 0158  NA 146*   < > 150* 142 141 135 137 130*  K 3.8   < > 3.6 4.2 3.5 3.6 4.0 4.2  CL 116*   < > 117* 112* 113* 109 107 101  CO2 16*   < > 16* 19* 21* 20* 19* 17*   GLUCOSE 73   < > 92 122* 108* 107* 106* 125*  BUN 47*   < > 42* 39* 34* 28* 26* 22*  CREATININE 1.63*   < > 1.60* 1.66* 1.69* 1.73* 1.62* 1.72*  CALCIUM 9.6   < > 9.8 9.7 9.6 8.2* 9.0 8.5*  MG 2.2  --  1.9  --  2.0 1.7  --   --   PHOS  --   --  3.6  --   --   --   --   --    < > = values in this interval not displayed.   GFR: Estimated Creatinine Clearance: 27.2 mL/min (A) (by C-G formula based on SCr of 1.72 mg/dL (H)). Liver Function Tests: Recent Labs  Lab 11/18/21 2235 11/19/21 0336 11/20/21 0142 11/25/21 0158  AST '26 31 30 17  ' ALT '19 21 22 15  ' ALKPHOS 58 66 59 68  BILITOT 0.3 0.4 0.8 0.3  PROT 7.3 7.6 7.2 7.8  ALBUMIN 3.0* 3.0* 3.0* 2.9*   No results for input(s): LIPASE, AMYLASE in the last 168 hours. Recent Labs  Lab 11/18/21 2237  AMMONIA 20   Coagulation Profile: Recent Labs  Lab 11/18/21 2235 11/19/21 0336 11/25/21 0158  INR 1.0 1.0 1.2   Cardiac Enzymes: Recent Labs  Lab 11/19/21 0338 11/20/21 0142  CKTOTAL 325* 226   BNP (last 3 results) No results for input(s): PROBNP in the last 8760 hours. HbA1C: No results for input(s): HGBA1C in the last 72 hours. CBG: Recent Labs  Lab 11/21/21 2347 11/22/21 0353 11/22/21 0803 11/22/21 1111 11/22/21 2009  GLUCAP 97 102* 83 118* 104*   Lipid Profile: No results for input(s): CHOL, HDL, LDLCALC, TRIG, CHOLHDL, LDLDIRECT in the last 72 hours. Thyroid Function Tests: No results for input(s): TSH, T4TOTAL,  FREET4, T3FREE, THYROIDAB in the last 72 hours. Anemia Panel: No results for input(s): VITAMINB12, FOLATE, FERRITIN, TIBC, IRON, RETICCTPCT in the last 72 hours. Urine analysis:    Component Value Date/Time   COLORURINE YELLOW 11/18/2021 2349   APPEARANCEUR CLEAR 11/18/2021 2349   LABSPEC 1.025 11/18/2021 2349   PHURINE 5.5 11/18/2021 2349   GLUCOSEU NEGATIVE 11/18/2021 2349   HGBUR NEGATIVE 11/18/2021 2349   BILIRUBINUR NEGATIVE 11/18/2021 2349   KETONESUR NEGATIVE 11/18/2021 2349    PROTEINUR 30 (A) 11/18/2021 2349   UROBILINOGEN 0.2 12/23/2017 1417   NITRITE NEGATIVE 11/18/2021 2349   LEUKOCYTESUR NEGATIVE 11/18/2021 2349    Radiological Exams on Admission: I have personally reviewed images DG Chest 2 View  Result Date: 11/25/2021 CLINICAL DATA:  Chest pain. EXAM: CHEST - 2 VIEW COMPARISON:  Radiograph dated 11/20/2021. FINDINGS: Minimal bibasilar atelectasis. No focal consolidation, pleural effusion, pneumothorax. The cardiac silhouette is within normal limits. No acute osseous pathology. IMPRESSION: No active cardiopulmonary disease. Electronically Signed   By: Anner Crete M.D.   On: 11/25/2021 03:00   DG Hand Complete Left  Result Date: 11/25/2021 CLINICAL DATA:  Swelling and inflammation in the anterior aspect of the second and third metatarsals. EXAM: LEFT HAND - COMPLETE 3+ VIEW COMPARISON:  None. FINDINGS: There is no evidence of fracture or dislocation. Degenerative changes are noted at the wrist. Soft tissue swelling is present over the dorsum of the hand and digits. Vascular calcifications are present in the soft tissues. IMPRESSION: Diffuse soft tissue swelling over the dorsum of the hand and digits. No acute osseous abnormality. Electronically Signed   By: Brett Fairy M.D.   On: 11/25/2021 03:03    EKG: I have personally reviewed EKG: no EKG  Assessment/Plan Principal Problem:   Cellulitis of left hand Active Problems:   Pustular rash   Hyponatremia   Severe malnutrition (HCC)   Anemia   GERD (gastroesophageal reflux disease)   Polysubstance abuse ([facility]CC)    Cellulitis of left hand Admit to MedSurg bed.  IV Rocephin and IV vancomycin.  Wound care consult.  Likely due to IV injection.  Although patient denies it.  There is a track marks on her dorsum of her hand.      Pustular rash ER doctor is worried about monkey pox.  Place patient in droplet and contact isolation.  ID consult in the morning.    Pictures below from  11-20-2020         Hyponatremia Continue with IV normal saline.  Severe malnutrition (HCC) chronic  Anemia Chronic.  GERD (gastroesophageal reflux disease) Chronic.  Polysubstance abuse [facility]) Patient with known history of IV drug use and polysubstance abuse.  Check urine drug screen.  Patient left AMA yesterday.  DVT prophylaxis: Lovenox Code Status: Full Code Family Communication: no family at bedside  Disposition Plan: homeless shelter  Consults called: none  Admission status: Observation, Med-Surg   Kristopher Oppenheim, DO [facility] 11/25/2021, 4:13 AM

## 2021-11-25 NOTE — Assessment & Plan Note (Addendum)
-   pustular rash.  Differential diagnosis does include monkeypox.  Seen by ID and they feel that this is most likely secondary to MRSA.  Continue contact precautions.  Lesions appear to be improving.

## 2021-11-25 NOTE — Assessment & Plan Note (Addendum)
-  known history of IV drug use and polysubstance abuse.   -Urine drug screen positive for benzodiazepine and cocaine -continue to encourage cessation  Continue BuSpar and Celexa

## 2021-11-25 NOTE — Assessment & Plan Note (Addendum)
SBP in the 100s on 2/7 therefore Norvasc DC'd Continue carvedilol As of 2/8 BP remains stable off of Norvasc

## 2021-11-25 NOTE — Assessment & Plan Note (Addendum)
Resolved Imaging without evidence of bony infection but did demonstrate soft tissue infection Evaluated by orthopedic team with no additional recommendations Continue antibiotics per ID recommendation-patient will start oral doxycycline after discharge on 2/14 for total of 3 weeks duration She will need to follow-up with the ID clinic after discharge.  An ambulatory referral will be sent.

## 2021-11-25 NOTE — ED Provider Triage Note (Signed)
Emergency Medicine Provider Triage Evaluation Note  Sherry Baker , a 56 y.o. female  was evaluated in triage.  Pt complains of complaints of left hand pain going on for weeks, states pain is gotten worse.  Recently admitted on 12/22  patient left AMA on 01/14 she is admitted for altered mental status likely secondary due to DTs placed on Precedex Librium taper also that she had aspiration and was intubated, extubated on 12/30.  Review of Systems  Positive: Hand pain, rash Negative: Chest pain, shortness of breath  Physical Exam  BP 120/74 (BP Location: Right Arm)    Pulse (!) 120    Temp (!) 101.9 F (38.8 C) (Oral)    Resp 17    Ht 5\' 2"  (1.575 m)    Wt 46 kg    SpO2 100%    BMI 18.55 kg/m  Gen:   Awake, no distress   Resp:  Normal effort  MSK:   Moves extremities without difficulty  Other:  Erythematous left hand, warm painful to the touch neurovascular fully intact  Medical Decision Making  Medically screening exam initiated at 1:50 AM.  Appropriate orders placed.  [facility] was informed that the remainder of the evaluation will be completed by another provider, this initial triage assessment does not replace that evaluation, and the importance of remaining in the ED until their evaluation is complete.  Patient meets sepsis criteria, sepsis work-up was ordered, recommend bring back for further evaluation.   Marcello Fennel, PA-C 11/25/21 0155

## 2021-11-25 NOTE — ED Provider Notes (Signed)
[facility] EMERGENCY DEPARTMENT Provider Note   CSN: [id number] Arrival date & time: 11/25/21  0131     History  Chief Complaint  Patient presents with   Hand Swelling   Rash    Sherry Baker is a 56 y.o. female.  The history is provided by the patient.  Rash She has history of bipolar disorder, hypertension, cirrhosis of the liver, chronic kidney disease and comes in because of a rash on her back and swelling of her left hand.  The rash is just on the right side of her back and has been present for about a month.  It is painful and seems to be getting worse.  She rates her pain a 10/10.  Her left hand is also been swollen for about a month and she is also complaining of pain at 10/10.  She noted a subjective fever tonight.  She denies chills or sweats.  She denies any rhinorrhea or cough or sore throat and denies vomiting or diarrhea.  She denies any sick contacts.   Home Medications Prior to Admission medications   Medication Sig Start Date End Date Taking? Authorizing Provider  acetaminophen (TYLENOL) 325 MG tablet Take 2 tablets (650 mg total) by mouth every 6 (six) hours as needed for mild pain, headache or fever. 09/30/21   Samella Parr, NP  amLODipine (NORVASC) 10 MG tablet Take 1 tablet (10 mg total) by mouth daily. 11/16/21   Shelly Coss, MD  busPIRone (BUSPAR) 15 MG tablet Take 1 tablet (15 mg total) by mouth 2 (two) times daily. 11/16/21   Shelly Coss, MD  carvedilol (COREG) 3.125 MG tablet Take 1 tablet (3.125 mg total) by mouth 2 (two) times daily with a meal. 11/16/21   Shelly Coss, MD  citalopram (CELEXA) 10 MG tablet Take 1 tablet (10 mg total) by mouth daily. 11/16/21   Shelly Coss, MD  diclofenac Sodium (VOLTAREN) 1 % GEL Apply 4 g topically 4 (four) times daily. 09/30/21   Samella Parr, NP  folic acid (FOLVITE) 1 MG tablet Take 1 tablet (1 mg total) by mouth daily. 11/17/21   Shelly Coss, MD  gabapentin (NEURONTIN) 100 MG  capsule Take 1 capsule (100 mg total) by mouth 3 (three) times daily. 11/16/21   Shelly Coss, MD  hydrOXYzine (ATARAX/VISTARIL) 10 MG tablet Take 1 tablet (10 mg total) by mouth 3 (three) times daily as needed. As needed for acute anxiety 11/15/20   Argentina Donovan, PA-C  lactulose (CHRONULAC) 10 GM/15ML solution Take 15 mLs (10 g total) by mouth 2 (two) times daily. 11/16/21   Shelly Coss, MD  Multiple Vitamin (MULTIVITAMIN WITH MINERALS) TABS tablet Place 1 tablet into feeding tube daily. Patient taking differently: Take 1 tablet by mouth daily. 04/09/21   Pokhrel, Corrie Mckusick, MD  nicotine (NICODERM CQ - DOSED IN MG/24 HOURS) 21 mg/24hr patch Place 1 patch (21 mg total) onto the skin daily. 11/17/21   Shelly Coss, MD  pantoprazole (PROTONIX) 40 MG tablet Take 1 tablet (40 mg total) by mouth 2 (two) times daily. 11/16/21 11/16/22  Shelly Coss, MD  thiamine 100 MG tablet Take 1 tablet (100 mg total) by mouth daily. 11/16/21   Shelly Coss, MD  traMADol (ULTRAM) 50 MG tablet Take 1 tablet (50 mg total) by mouth every 6 (six) hours as needed for moderate pain. 09/30/21   Samella Parr, NP  Vitamin D3 (VITAMIN D) 25 MCG tablet Take 1 tablet (1,000 Units total) by mouth daily.  09/30/21   Samella Parr, NP  ziprasidone (GEODON) 40 MG capsule Take 1 capsule (40 mg total) by mouth 2 (two) times daily with a meal. 11/06/11 01/09/12  Daleen Bo, MD      Allergies    Patient has no known allergies.    Review of Systems   Review of Systems  Skin:  Positive for rash.  All other systems reviewed and are negative.  Physical Exam Updated Vital Signs BP 120/74 (BP Location: Right Arm)    Pulse (!) 120    Temp (!) 101.9 F (38.8 C) (Oral)    Resp 17    Ht 5\' 2"  (1.575 m)    Wt 46 kg    SpO2 100%    BMI 18.55 kg/m  Physical Exam Vitals and nursing note reviewed.  Chronically ill-appearing 56 year old female, resting comfortably and in no acute distress. Vital signs are significant for  elevated temperature and heart rate. Oxygen saturation is 100%, which is normal. Head is normocephalic and atraumatic. PERRLA, EOMI. Oropharynx is clear. Neck is nontender and supple without adenopathy or JVD. Back: Pustular rash is present on the right side of the back extending all the way down to the sacrum.  There is a sacral decubitus present with a dressing in place which is not removed. Lungs are clear without rales, wheezes, or rhonchi. Chest is nontender. Heart has regular rate and rhythm without murmur. Abdomen is soft, flat, nontender without masses or hepatosplenomegaly and peristalsis is normoactive. Extremities: There is erythema and warmth and soft tissue swelling of the dorsum of the left hand with some erythema of the distal forearm consistent with cellulitis.  There is no peripheral edema. Skin is warm and dry. Neurologic: Mental status is normal, cranial nerves are intact, moves all extremities equally.   ED Results / Procedures / Treatments   Labs (all labs ordered are listed, but only abnormal results are displayed) Labs Reviewed  COMPREHENSIVE METABOLIC PANEL - Abnormal; Notable for the following components:      Result Value   Sodium 130 (*)    CO2 17 (*)    Glucose, Bld 125 (*)    BUN 22 (*)    Creatinine, Ser 1.72 (*)    Calcium 8.5 (*)    Albumin 2.9 (*)    GFR, Estimated 35 (*)    All other components within normal limits  CBC WITH DIFFERENTIAL/PLATELET - Abnormal; Notable for the following components:   WBC 12.0 (*)    RBC 3.52 (*)    Hemoglobin 10.0 (*)    HCT 32.2 (*)    RDW 18.2 (*)    Neutro Abs 10.1 (*)    Lymphs Abs 0.6 (*)    Monocytes Absolute 1.1 (*)    Abs Immature Granulocytes 0.17 (*)    All other components within normal limits  I-STAT BETA HCG BLOOD, ED ([facility], [facility], [facility] ONLY) - Abnormal; Notable for the following components:   I-stat hCG, quantitative 6.1 (*)    All other components within normal limits  CULTURE, BLOOD (ROUTINE X 2)   CULTURE, BLOOD (ROUTINE X 2)  LACTIC ACID, PLASMA  PROTIME-INR  ETHANOL  LACTIC ACID, PLASMA  URINALYSIS, ROUTINE W REFLEX MICROSCOPIC  RAPID URINE DRUG SCREEN, HOSP PERFORMED  HIV ANTIBODY (ROUTINE TESTING W REFLEX)   Radiology DG Chest 2 View  Result Date: 11/25/2021 CLINICAL DATA:  Chest pain. EXAM: CHEST - 2 VIEW COMPARISON:  Radiograph dated 11/20/2021. FINDINGS: Minimal bibasilar atelectasis. No focal consolidation, pleural  effusion, pneumothorax. The cardiac silhouette is within normal limits. No acute osseous pathology. IMPRESSION: No active cardiopulmonary disease. Electronically Signed   By: Anner Crete M.D.   On: 11/25/2021 03:00   DG Hand Complete Left  Result Date: 11/25/2021 CLINICAL DATA:  Swelling and inflammation in the anterior aspect of the second and third metatarsals. EXAM: LEFT HAND - COMPLETE 3+ VIEW COMPARISON:  None. FINDINGS: There is no evidence of fracture or dislocation. Degenerative changes are noted at the wrist. Soft tissue swelling is present over the dorsum of the hand and digits. Vascular calcifications are present in the soft tissues. IMPRESSION: Diffuse soft tissue swelling over the dorsum of the hand and digits. No acute osseous abnormality. Electronically Signed   By: Brett Fairy M.D.   On: 11/25/2021 03:03    Procedures Procedures  Monitor shows sinus tachycardia per my interpretation.  Medications Ordered in ED Medications  acetaminophen (TYLENOL) tablet 650 mg (650 mg Oral Given 11/25/21 0209)  cefTRIAXone (ROCEPHIN) 2 g in sodium chloride 0.9 % 100 mL IVPB (has no administration in time range)  lactated ringers bolus 1,000 mL (has no administration in time range)    ED Course/ Medical Decision Making/ A&P                           Medical Decision Making  Fever with findings consistent with cellulitis of the left hand.  Pustular rash on the back is concerning for monkey pox.  Labs showed a mild leukocytosis as well as mild anemia  which is not significantly changed from baseline.  Moderate renal insufficiency is also present which is not significantly changed from baseline.  Mild hyponatremia is present which is not felt to be clinically significant.  Lactic acid level is normal, she does not need to be put on the sepsis pathway.  She is started on ceftriaxone for her cellulitis.  HIV test is also sent.  Chest x-ray shows no pneumonia, and x-ray shows soft tissue swelling but no other acute lesion.  I have independently viewed the images, and agree with the radiologist's interpretation.  She is given acetaminophen for fever and IV fluids for tachycardia.  Old records are reviewed, and she actually has several recent hospitalizations including one from 11/18/2021 to 11/23/2021, when she left AMA.  There is a note of rash on her back being present on 1/18 and there was concern that it was bedbugs.  However, the rash has progressed to frankly pustular since then.  Last HIV test was negative in May 2022.  She will need to be admitted for IV antibiotics, will probably need infectious disease consult.  Case is discussed with Dr. Bridgett Larsson of [facility], who agrees to admit the patient.  CRITICAL CARE Performed by: Delora Fuel Total critical care time: 40 minutes Critical care time was exclusive of separately billable procedures and treating other patients. Critical care was necessary to treat or prevent imminent or life-threatening deterioration. Critical care was time spent personally by me on the following activities: development of treatment plan with patient and/or surrogate as well as nursing, discussions with consultants, evaluation of patient's response to treatment, examination of patient, obtaining history from patient or surrogate, ordering and performing treatments and interventions, ordering and review of laboratory studies, ordering and review of radiographic studies, pulse oximetry and re-evaluation of patient's condition.        Final Clinical Impression(s) / ED Diagnoses Final diagnoses:  Cellulitis of left hand  Pustular rash  Renal insufficiency  Normochromic normocytic anemia  Hyponatremia    Rx / DC Orders ED Discharge Orders     None         Delora Fuel, MD 34/96/11 [phone]

## 2021-11-25 NOTE — ED Triage Notes (Signed)
Pt BIB [facility] for eval of L hand pain and swelling x 2 weeks. Pt also c/o rash to lower back x weeks as well. Pt reports some minor ETOH use "no enough to get fucked up" Pt w/ redness and streaking to L hand w/ some ? Streaking

## 2021-11-25 NOTE — ED Notes (Signed)
Pt ostomy bag not sealed around stoma. Bag full. Attempted to empty bag with little success. Stool oozing out around seal and covered pt. Pt cleaned up as best as could be. Pt states she does not know supply sizes. Pt hollering for pain med. MD aware. Wound care RN notified by e-mail.

## 2021-11-25 NOTE — Assessment & Plan Note (Addendum)
Chronic for the most part.   Hemoglobin 8.3 on 2/6

## 2021-11-25 NOTE — Consult Note (Signed)
[facility]  Total days of antibiotics 2       Reason for Consult: left hand cellulitis, pustular rash    Referring Physician: [place] Pink  Principal Problem:   Cellulitis of left hand Active Problems:   Severe malnutrition ([place])   Anemia   Hypertension   Polysubstance abuse ([place])   Pustular rash   Hyponatremia   CKD (chronic kidney disease), stage III (HCC)   Colostomy in place [facility])   MRSA bacteremia    HPI: Sherry Baker is a 56 y.o. female with hx of bipolar disease, rectal ca s/p colostomy, polysubstance abuse admitted due to worsening pain to left hand that is swollen and erythamatous. She also has pustular rash to her back predominantly. She states she feels unwell, painful to move legs though she states she gets around on walker. She is currently afebrile but appears in distress due to pain. On admit, her ostomy was displaced and she had significant fecal contamination to most areas. She was found to have leukocytosis of 12K (up for her baseline of 5.3) with left shift. Cxr was clear. No fractures to left hand based on xray. Has full range of motion to left hand but tender.   She states her rash has been present for a few weeks. Some of the areas are tender, her partner does not have any similar lesions.  Past Medical History:  Diagnosis Date   Alcohol abuse    Allergy    PCNS swelling   Arthritis    Bipolar 1 disorder ([place])    Cancer ([place]) 01/21/2017   rectal cancer   Cancer ([place])    Cirrhosis of liver ([place])    COVID-19 virus infection 06/23/2021   Delirium tremens ([place]) 06/22/2021   Depression    Genetic testing 03/24/2017   Ms. Sonntag underwent genetic counseling and testing for hereditary cancer syndromes on 02/17/2017. Her results were negative for mutations in all 46 genes analyzed by [facility]'s 46-gene Common Hereditary Cancers Panel. Genes analyzed include: APC, ATM, AXIN2, BARD1, BMPR1A, BRCA1, BRCA2, BRIP1, CDH1, CDKN2A, CHEK2, CTNNA1,  DICER1, EPCAM, GREM1, HOXB13, KIT, MEN1, MLH1, MSH2, MSH3, MSH6, MUTYH, NBN,   Hypertension    Suicidal ideation 02/11/2014    Allergies:  Allergies  Allergen Reactions   Penicillins Hives and Swelling    Has patient had a PCN reaction causing immediate rash, facial/tongue/throat swelling, SOB or lightheadedness with hypotension: Yes Has patient had a PCN reaction causing severe rash involving mucus membranes or skin necrosis: No Has patient had a PCN reaction that required hospitalization No Has patient had a PCN reaction occurring within the last 10 years: Yes If all of the above answers are "NO", then may proceed with Cephalosporin use.     Current antibiotics:   MEDICATIONS:  enoxaparin (LOVENOX) injection  30 mg Subcutaneous Q24H   gabapentin  100 mg Oral TID   pantoprazole  40 mg Oral BID   senna-docusate  2 tablet Oral QHS    Social History   Tobacco Use   Smoking status: Every Day    Packs/day: 0.50    Years: 39.00    Pack years: 19.50    Types: Cigarettes   Smokeless tobacco: Never  Vaping Use   Vaping Use: Never used  Substance Use Topics   Alcohol use: Yes    Comment: 07/28/2017 "40oz beer/day; if I have it"   Drug use: Yes    Types: "Crack" cocaine, Cocaine    Comment: 07/28/2017 "nothing  in 3 wks"    Family History  Problem Relation Age of Onset   Cancer Mother 21       Originating in the abdomen, otherwise unknwon   Cancer - Other Maternal Aunt        Throat    Review of Systems - 12 point ros is negative except what is mentioned in hpi   OBJECTIVE: Temp:  [98.7 F (37.1 C)-101.9 F (38.8 C)] 98.7 F (37.1 C) (01/23 0759) Pulse Rate:  [106-136] 117 (01/23 1315) Resp:  [14-29] 15 (01/23 1315) BP: (78-147)/(62-90) 142/78 (01/23 1315) SpO2:  [95 %-100 %] 99 % (01/23 1315) Weight:  [46 kg] 46 kg (01/23 0136) Physical Exam  Constitutional:  oriented to person, place, and time. appears older than stated age, underweight and mal-nourished. Mild  distress due to pain HENT: [place]/AT, PERRLA, no scleral icterus Mouth/Throat: Oropharynx is clear and moist. No oropharyngeal exudate.  Cardiovascular: Normal rate, regular rhythm and normal heart sounds. Exam reveals no gallop and no friction rub.  No murmur heard.  Pulmonary/Chest: Effort normal and breath sounds normal. No respiratory distress.  has no wheezes.  Neck = supple, no nuchal rigidity Abdominal: Soft. Bowel sounds are normal.  exhibits no distension. There is no tenderness. Ostomy in place Perianal region = fissure and sacral pressure wound. Palpable bone Skin =pustular lesions scattered on back. Some are nodular. Some purulence draining. No umbilicated areas.  Lymphadenopathy: no cervical adenopathy. No axillary adenopathy Neurological: alert and oriented to person, place, and time. Wasted lower extremities  Ext: left dorsum of hand swollen with diffuse erythema, blanching Psychiatric: a normal mood and affect.  behavior is normal.    LABS: Results for orders placed or performed during the hospital encounter of 11/25/21 (from the past 48 hour(s))  Comprehensive metabolic panel     Status: Abnormal   Collection Time: 11/25/21  1:58 AM  Result Value Ref Range   Sodium 130 (L) 135 - 145 mmol/L    Comment: DELTA CHECK NOTED   Potassium 4.2 3.5 - 5.1 mmol/L   Chloride 101 98 - 111 mmol/L   CO2 17 (L) 22 - 32 mmol/L   Glucose, Bld 125 (H) 70 - 99 mg/dL    Comment: Glucose reference range applies only to samples taken after fasting for at least 8 hours.   BUN 22 (H) 6 - 20 mg/dL   Creatinine, Ser 1.72 (H) 0.44 - 1.00 mg/dL   Calcium 8.5 (L) 8.9 - 10.3 mg/dL   Total Protein 7.8 6.5 - 8.1 g/dL   Albumin 2.9 (L) 3.5 - 5.0 g/dL   AST 17 15 - 41 U/L   ALT 15 0 - 44 U/L   Alkaline Phosphatase 68 38 - 126 U/L   Total Bilirubin 0.3 0.3 - 1.2 mg/dL   GFR, Estimated 35 (L) >60 mL/min    Comment: (NOTE) Calculated using the CKD-EPI Creatinine Equation (2021)    Anion gap 12 5 - 15     Comment: Performed at [facility], [place] [address], [place], [place] [address]  Lactic acid, plasma     Status: None   Collection Time: 11/25/21  1:58 AM  Result Value Ref Range   Lactic Acid, Venous 1.8 0.5 - 1.9 mmol/L    Comment: Performed at [facility] [address], [place], [place] [address]  CBC with Differential     Status: Abnormal   Collection Time: 11/25/21  1:58 AM  Result Value Ref Range  WBC 12.0 (H) 4.0 - 10.5 K/uL   RBC 3.52 (L) 3.87 - 5.11 MIL/uL   Hemoglobin 10.0 (L) 12.0 - 15.0 g/dL   HCT 32.2 (L) 36.0 - 46.0 %   MCV 91.5 80.0 - 100.0 fL   MCH 28.4 26.0 - 34.0 pg   MCHC 31.1 30.0 - 36.0 g/dL   RDW 18.2 (H) 11.5 - 15.5 %   Platelets 196 150 - 400 K/uL   nRBC 0.0 0.0 - 0.2 %   Neutrophils Relative % 84 %   Neutro Abs 10.1 (H) 1.7 - 7.7 K/uL   Lymphocytes Relative 5 %   Lymphs Abs 0.6 (L) 0.7 - 4.0 K/uL   Monocytes Relative 9 %   Monocytes Absolute 1.1 (H) 0.1 - 1.0 K/uL   Eosinophils Relative 1 %   Eosinophils Absolute 0.1 0.0 - 0.5 K/uL   Basophils Relative 0 %   Basophils Absolute 0.0 0.0 - 0.1 K/uL   Immature Granulocytes 1 %   Abs Immature Granulocytes 0.17 (H) 0.00 - 0.07 K/uL    Comment: Performed at [facility], [address], [place], [place] [address]  Protime-INR     Status: None   Collection Time: 11/25/21  1:58 AM  Result Value Ref Range   Prothrombin Time 15.1 11.4 - 15.2 seconds   INR 1.2 0.8 - 1.2    Comment: (NOTE) INR goal varies based on device and disease states. Performed at [facility], [place] [address] [place], [place] [address]   Culture, blood (Routine x 2)     Status: None (Preliminary result)   Collection Time: 11/25/21  1:58 AM   Specimen: BLOOD  Result Value Ref Range   Specimen Description BLOOD RIGHT ANTECUBITAL    Special Requests      BOTTLES DRAWN AEROBIC AND ANAEROBIC Blood Culture adequate volume   Culture  Setup Time      GRAM POSITIVE COCCI IN CLUSTERS IN BOTH AEROBIC AND  ANAEROBIC BOTTLES Organism ID to follow CRITICAL RESULT CALLED TO, READ BACK BY AND VERIFIED WITH: J. FRENS PHARMD, AT [id number] 11/25/21 D. VANHOOK Performed at [facility], [place] [address] [place], [place] [address]    Culture GRAM POSITIVE COCCI    Report Status PENDING   Ethanol     Status: None   Collection Time: 11/25/21  1:58 AM  Result Value Ref Range   Alcohol, Ethyl (B) <10 <10 mg/dL    Comment: (NOTE) Lowest detectable limit for serum alcohol is 10 mg/dL.  For medical purposes only. Performed at [facility], [place] [address], [place], [place] [address]   Magnesium     Status: None   Collection Time: 11/25/21  1:58 AM  Result Value Ref Range   Magnesium 2.0 1.7 - 2.4 mg/dL    Comment: Performed at [facility] [address] [place],  [address]  Blood Culture ID Panel (Reflexed)     Status: Abnormal   Collection Time: 11/25/21  1:58 AM  Result Value Ref Range   Enterococcus faecalis NOT DETECTED NOT DETECTED   Enterococcus Faecium NOT DETECTED NOT DETECTED   Listeria monocytogenes NOT DETECTED NOT DETECTED   Staphylococcus species DETECTED (A) NOT DETECTED    Comment: CRITICAL RESULT CALLED TO, READ BACK BY AND VERIFIED WITH: J. FRENS PHARMD, AT 6629 11/25/21 D. VANHOOK    Staphylococcus aureus (BCID) DETECTED (A) NOT DETECTED    Comment: Methicillin (oxacillin)-resistant Staphylococcus aureus (MRSA). MRSA is predictably resistant  to beta-lactam antibiotics (except ceftaroline). Preferred therapy is vancomycin unless clinically contraindicated. Patient requires contact precautions if  hospitalized. CRITICAL RESULT CALLED TO, READ BACK BY AND VERIFIED WITH: J. FRENS PHARMD, AT [id number] 11/25/21 D. VANHOOK    Staphylococcus epidermidis NOT DETECTED NOT DETECTED   Staphylococcus lugdunensis NOT DETECTED NOT DETECTED   Streptococcus species NOT DETECTED NOT DETECTED   Streptococcus agalactiae NOT DETECTED NOT DETECTED   Streptococcus pneumoniae NOT  DETECTED NOT DETECTED   Streptococcus pyogenes NOT DETECTED NOT DETECTED   A.calcoaceticus-baumannii NOT DETECTED NOT DETECTED   Bacteroides fragilis NOT DETECTED NOT DETECTED   Enterobacterales NOT DETECTED NOT DETECTED   Enterobacter cloacae complex NOT DETECTED NOT DETECTED   Escherichia coli NOT DETECTED NOT DETECTED   Klebsiella aerogenes NOT DETECTED NOT DETECTED   Klebsiella oxytoca NOT DETECTED NOT DETECTED   Klebsiella pneumoniae NOT DETECTED NOT DETECTED   Proteus species NOT DETECTED NOT DETECTED   Salmonella species NOT DETECTED NOT DETECTED   Serratia marcescens NOT DETECTED NOT DETECTED   Haemophilus influenzae NOT DETECTED NOT DETECTED   Neisseria meningitidis NOT DETECTED NOT DETECTED   Pseudomonas aeruginosa NOT DETECTED NOT DETECTED   Stenotrophomonas maltophilia NOT DETECTED NOT DETECTED   Candida albicans NOT DETECTED NOT DETECTED   Candida auris NOT DETECTED NOT DETECTED   Candida glabrata NOT DETECTED NOT DETECTED   Candida krusei NOT DETECTED NOT DETECTED   Candida parapsilosis NOT DETECTED NOT DETECTED   Candida tropicalis NOT DETECTED NOT DETECTED   Cryptococcus neoformans/gattii NOT DETECTED NOT DETECTED   Meth resistant mecA/C and MREJ DETECTED (A) NOT DETECTED    Comment: CRITICAL RESULT CALLED TO, READ BACK BY AND VERIFIED WITH: J. FRENS PHARMD, AT 7416 11/25/21 D. VANHOOK Performed at [facility], [place] [address] [place], [place] [address]   Culture, blood (Routine x 2)     Status: None (Preliminary result)   Collection Time: 11/25/21  2:05 AM   Specimen: BLOOD RIGHT HAND  Result Value Ref Range   Specimen Description BLOOD RIGHT HAND    Special Requests      BOTTLES DRAWN AEROBIC AND ANAEROBIC Blood Culture adequate volume   Culture  Setup Time      GRAM POSITIVE COCCI IN BOTH AEROBIC AND ANAEROBIC BOTTLES CRITICAL VALUE NOTED.  VALUE IS CONSISTENT WITH PREVIOUSLY REPORTED AND CALLED VALUE. Performed at [facility], [place] [address] [place], [place] [address]    Culture PENDING    Report Status PENDING   I-Stat beta hCG blood, ED     Status: Abnormal   Collection Time: 11/25/21  2:16 AM  Result Value Ref Range   I-stat hCG, quantitative 6.1 (H) <5 mIU/mL   Comment 3            Comment:   GEST. AGE      CONC.  (mIU/mL)   <=1 WEEK        5 - 50     2 WEEKS       50 - 500     3 WEEKS       100 - 10,000     4 WEEKS     1,000 - 30,000        FEMALE AND NON-PREGNANT FEMALE:     LESS THAN 5 mIU/mL   Lactic acid, plasma     Status: None   Collection Time: 11/25/21  3:50 AM  Result Value Ref Range   Lactic Acid, Venous 0.9 0.5 - 1.9 mmol/L  Comment: Performed at [facility], [place] [address], [place], [place] [address]  HIV Antibody (routine testing w rflx)     Status: None   Collection Time: 11/25/21  3:50 AM  Result Value Ref Range   HIV Screen 4th Generation wRfx Non Reactive Non Reactive    Comment: Performed at [facility], [place] [address] [place], [place] [address]  Resp Panel by RT-PCR (Flu A&B, Covid) Nasopharyngeal Swab     Status: None   Collection Time: 11/25/21  4:20 AM   Specimen: Nasopharyngeal Swab; Nasopharyngeal(NP) swabs in vial transport medium  Result Value Ref Range   SARS Coronavirus 2 by RT PCR NEGATIVE NEGATIVE    Comment: (NOTE) SARS-CoV-2 target nucleic acids are NOT DETECTED.  The SARS-CoV-2 RNA is generally detectable in upper respiratory specimens during the acute phase of infection. The lowest concentration of SARS-CoV-2 viral copies this assay can detect is 138 copies/mL. A negative result does not preclude SARS-Cov-2 infection and should not be used as the sole basis for treatment or other patient management decisions. A negative result may occur with  improper specimen collection/handling, submission of specimen other than nasopharyngeal swab, presence of viral mutation(s) within the areas targeted by this assay, and inadequate number of viral copies(<138  copies/mL). A negative result must be combined with clinical observations, patient history, and epidemiological information. The expected result is Negative.  Fact Sheet for Patients:  EntrepreneurPulse.com.au  Fact Sheet for Healthcare Providers:  IncredibleEmployment.be  This test is no t yet approved or cleared by the [place] FDA and  has been authorized for detection and/or diagnosis of SARS-CoV-2 by FDA under an Emergency Use Authorization (EUA). This EUA will remain  in effect (meaning this test can be used) for the duration of the COVID-19 declaration under Section 564(b)(1) of the Act, 21 U.S.C.section 360bbb-3(b)(1), unless the authorization is terminated  or revoked sooner.       Influenza A by PCR NEGATIVE NEGATIVE   Influenza B by PCR NEGATIVE NEGATIVE    Comment: (NOTE) The Xpert Xpress SARS-CoV-2/FLU/RSV plus assay is intended as an aid in the diagnosis of influenza from Nasopharyngeal swab specimens and should not be used as a sole basis for treatment. Nasal washings and aspirates are unacceptable for Xpert Xpress SARS-CoV-2/FLU/RSV testing.  Fact Sheet for Patients: EntrepreneurPulse.com.au  Fact Sheet for Healthcare Providers: IncredibleEmployment.be  This test is not yet approved or cleared by the [place] FDA and has been authorized for detection and/or diagnosis of SARS-CoV-2 by FDA under an Emergency Use Authorization (EUA). This EUA will remain in effect (meaning this test can be used) for the duration of the COVID-19 declaration under Section 564(b)(1) of the Act, 21 U.S.C. section 360bbb-3(b)(1), unless the authorization is terminated or revoked.  Performed at [facility], [place] [address] [place], [place] [address]   CBC with Differential/Platelet     Status: Abnormal   Collection Time: 11/25/21  4:50 AM  Result Value Ref Range   WBC 10.3 4.0 - 10.5 K/uL    RBC 3.12 (L) 3.87 - 5.11 MIL/uL   Hemoglobin 9.1 (L) 12.0 - 15.0 g/dL   HCT 28.0 (L) 36.0 - 46.0 %   MCV 89.7 80.0 - 100.0 fL   MCH 29.2 26.0 - 34.0 pg   MCHC 32.5 30.0 - 36.0 g/dL   RDW 18.2 (H) 11.5 - 15.5 %   Platelets 159 150 - 400 K/uL   nRBC 0.0 0.0 - 0.2 %  Neutrophils Relative % 84 %   Neutro Abs 8.6 (H) 1.7 - 7.7 K/uL   Lymphocytes Relative 5 %   Lymphs Abs 0.6 (L) 0.7 - 4.0 K/uL   Monocytes Relative 9 %   Monocytes Absolute 0.9 0.1 - 1.0 K/uL   Eosinophils Relative 1 %   Eosinophils Absolute 0.1 0.0 - 0.5 K/uL   Basophils Relative 0 %   Basophils Absolute 0.0 0.0 - 0.1 K/uL   Immature Granulocytes 1 %   Abs Immature Granulocytes 0.11 (H) 0.00 - 0.07 K/uL    Comment: Performed at [facility] [address], [place], [place] [address]  Urinalysis, Routine w reflex microscopic     Status: Abnormal   Collection Time: 11/25/21  1:02 PM  Result Value Ref Range   Color, Urine YELLOW YELLOW   APPearance CLEAR CLEAR   Specific Gravity, Urine 1.012 1.005 - 1.030   pH 6.0 5.0 - 8.0   Glucose, UA NEGATIVE NEGATIVE mg/dL   Hgb urine dipstick NEGATIVE NEGATIVE   Bilirubin Urine NEGATIVE NEGATIVE   Ketones, ur NEGATIVE NEGATIVE mg/dL   Protein, ur 100 (A) NEGATIVE mg/dL   Nitrite NEGATIVE NEGATIVE   Leukocytes,Ua NEGATIVE NEGATIVE   RBC / HPF 0-5 0 - 5 RBC/hpf   WBC, UA 0-5 0 - 5 WBC/hpf   Bacteria, UA NONE SEEN NONE SEEN   Squamous Epithelial / LPF 0-5 0 - 5    Comment: Performed at [facility], [place] [address], [place], [place] [address]  Urine rapid drug screen (hosp performed)     Status: Abnormal   Collection Time: 11/25/21  1:03 PM  Result Value Ref Range   Opiates NONE DETECTED NONE DETECTED   Cocaine POSITIVE (A) NONE DETECTED   Benzodiazepines POSITIVE (A) NONE DETECTED   Amphetamines NONE DETECTED NONE DETECTED   Tetrahydrocannabinol NONE DETECTED NONE DETECTED   Barbiturates POSITIVE (A) NONE DETECTED    Comment: (NOTE) DRUG SCREEN FOR MEDICAL  PURPOSES ONLY.  IF CONFIRMATION IS NEEDED FOR ANY PURPOSE, NOTIFY LAB WITHIN 5 DAYS.  LOWEST DETECTABLE LIMITS FOR URINE DRUG SCREEN Drug Class                     Cutoff (ng/mL) Amphetamine and metabolites    1000 Barbiturate and metabolites    200 Benzodiazepine                 818 Tricyclics and metabolites     300 Opiates and metabolites        300 Cocaine and metabolites        300 THC                            50 Performed at [facility], [place] [address], [place], [place] [address]     MICRO: Blood cx MRSA IMAGING: DG Chest 2 View  Result Date: 11/25/2021 CLINICAL DATA:  Chest pain. EXAM: CHEST - 2 VIEW COMPARISON:  Radiograph dated 11/20/2021. FINDINGS: Minimal bibasilar atelectasis. No focal consolidation, pleural effusion, pneumothorax. The cardiac silhouette is within normal limits. No acute osseous pathology. IMPRESSION: No active cardiopulmonary disease. Electronically Signed   By: Anner Crete M.D.   On: 11/25/2021 03:00   DG Hand Complete Left  Result Date: 11/25/2021 CLINICAL DATA:  Swelling and inflammation in the anterior aspect of the second and third metatarsals. EXAM: LEFT HAND - COMPLETE 3+ VIEW COMPARISON:  None. FINDINGS: There  is no evidence of fracture or dislocation. Degenerative changes are noted at the wrist. Soft tissue swelling is present over the dorsum of the hand and digits. Vascular calcifications are present in the soft tissues. IMPRESSION: Diffuse soft tissue swelling over the dorsum of the hand and digits. No acute osseous abnormality. Electronically Signed   By: Brett Fairy M.D.   On: 11/25/2021 03:03    HISTORICAL MICRO/IMAGING  Assessment/Plan:  56yo F admitted for left had cellulitis, pustular rash found to have mrsa bacteremia in addition to moderate malnutrition and sacral decubitus ulcer/early osteo. - will check for HSV as cause of sacral fissure though it maybe due to excess moisture - recommend wound care for sacral decub  ulcer - pustular rash = likely mrsa, continue on vancomycin  Mrsa bacteremia = likely involved with left hand cellulitis, continue on cellulitis. Repeat blood cx tomorrow. Please get TEE to help decide on length of therapy.  Moderate malnutrition = recommend to have nutrition provide recs for nutritional gains to help with sacral decub wound healing  Nazeer Romney B. [facility] [phone]

## 2021-11-25 NOTE — Subjective & Objective (Signed)
CC: left hand swelling HPI: 56 year old female with a history of polysubstance abuse, history of rectal cancer, malnutrition who left AMA from the hospital yesterday on 11/23/2021.  Patient had spent approximately 4 weeks in the hospital including stay in the ICU and being intubated.  Patient return to the ER due to swelling of her hand.  EDP also concerned the patient may have contracted monkey proximal.  There is a picture while she was in the ICU from January 18 of a pustular rash on her back.  This is gotten progressively worse.  Pustules are now purulent.  Patient is evasive will not give any answers to questions.  [facility] hospitalist contacted for admission.

## 2021-11-25 NOTE — Hospital Course (Addendum)
56 y.o. female with medical history significant for polysubstance abuse, including cocaine use as well as alcohol abuse, type I bipolar disorder, central hypertension, generalized anxiety disorder, stage IIIb chronic kidney disease with associated baseline creatinine 1.3-1.6, chronic anemia with baseline hemoglobin 10-12, who was admitted to [facility] on 11/18/2021 with acute metabolic encephalopathy after presenting from home to [facility] ED for evaluation of altered mental status.  Recently hospitalized from December 22 to January 4 for altered mental status.  At that time also she was on Precedex.  She went into respiratory failure had to be intubated.  Supposed to discharge to skilled nursing facility which the patient eventually declined and went home.  Presented back on 1/16 with altered mental status.  Again placed on Precedex infusion in the ICU.  Was found to have maculopapular rash which became pustular.  Etiology was unclear.  Left AMA from the hospital on 11/23/2021.  Patient return to the ER due to swelling of her hand.  There was also concern that the rash on her back could be from monkey [place].  Patient was hospitalized for further management.  Subsequently noted to have MRSA bacteremia.  Infectious disease was consulted.

## 2021-11-25 NOTE — Progress Notes (Signed)
PHARMACY - PHYSICIAN COMMUNICATION CRITICAL VALUE ALERT - BLOOD CULTURE IDENTIFICATION (BCID)  Sherry Baker is an 56 y.o. female who presented to [facility] on 11/25/2021 with a chief complaint of left hand pain, rash to lower back.  Assessment:  One set of blood cultures positive for MRSA.  ID has already been consulted  Name of physician (or Provider) Contacted: Dr. Maryland Pink  Current antibiotics: Vancomycin IV  Changes to prescribed antibiotics recommended:  Patient is on recommended antibiotics - No changes needed  Results for orders placed or performed during the hospital encounter of 11/25/21  Blood Culture ID Panel (Reflexed) (Collected: 11/25/2021  1:58 AM)  Result Value Ref Range   Enterococcus faecalis NOT DETECTED NOT DETECTED   Enterococcus Faecium NOT DETECTED NOT DETECTED   Listeria monocytogenes NOT DETECTED NOT DETECTED   Staphylococcus species DETECTED (A) NOT DETECTED   Staphylococcus aureus (BCID) DETECTED (A) NOT DETECTED   Staphylococcus epidermidis NOT DETECTED NOT DETECTED   Staphylococcus lugdunensis NOT DETECTED NOT DETECTED   Streptococcus species NOT DETECTED NOT DETECTED   Streptococcus agalactiae NOT DETECTED NOT DETECTED   Streptococcus pneumoniae NOT DETECTED NOT DETECTED   Streptococcus pyogenes NOT DETECTED NOT DETECTED   A.calcoaceticus-baumannii NOT DETECTED NOT DETECTED   Bacteroides fragilis NOT DETECTED NOT DETECTED   Enterobacterales NOT DETECTED NOT DETECTED   Enterobacter cloacae complex NOT DETECTED NOT DETECTED   Escherichia coli NOT DETECTED NOT DETECTED   Klebsiella aerogenes NOT DETECTED NOT DETECTED   Klebsiella oxytoca NOT DETECTED NOT DETECTED   Klebsiella pneumoniae NOT DETECTED NOT DETECTED   Proteus species NOT DETECTED NOT DETECTED   Salmonella species NOT DETECTED NOT DETECTED   Serratia marcescens NOT DETECTED NOT DETECTED   Haemophilus influenzae NOT DETECTED NOT DETECTED   Neisseria meningitidis NOT DETECTED NOT DETECTED    Pseudomonas aeruginosa NOT DETECTED NOT DETECTED   Stenotrophomonas maltophilia NOT DETECTED NOT DETECTED   Candida albicans NOT DETECTED NOT DETECTED   Candida auris NOT DETECTED NOT DETECTED   Candida glabrata NOT DETECTED NOT DETECTED   Candida krusei NOT DETECTED NOT DETECTED   Candida parapsilosis NOT DETECTED NOT DETECTED   Candida tropicalis NOT DETECTED NOT DETECTED   Cryptococcus neoformans/gattii NOT DETECTED NOT DETECTED   Meth resistant mecA/C and MREJ DETECTED (A) NOT DETECTED    Candie Mile 11/25/2021  2:20 PM

## 2021-11-25 NOTE — Assessment & Plan Note (Addendum)
-  2/2 IVDU.  -ID following.   -Echocardiogram does not show any obvious vegetations.   -TEE negative -repeat BC NGTD -Complete antibiotics on 2/12 and discharged home.  She will begin oral doxycycline on 2/14 for a total of 3 weeks duration

## 2021-11-26 ENCOUNTER — Inpatient Hospital Stay (HOSPITAL_COMMUNITY): Payer: Medicaid Other

## 2021-11-26 DIAGNOSIS — N1832 Chronic kidney disease, stage 3b: Secondary | ICD-10-CM

## 2021-11-26 DIAGNOSIS — L03114 Cellulitis of left upper limb: Secondary | ICD-10-CM | POA: Diagnosis not present

## 2021-11-26 DIAGNOSIS — B9562 Methicillin resistant Staphylococcus aureus infection as the cause of diseases classified elsewhere: Secondary | ICD-10-CM | POA: Diagnosis not present

## 2021-11-26 DIAGNOSIS — E871 Hypo-osmolality and hyponatremia: Secondary | ICD-10-CM | POA: Diagnosis not present

## 2021-11-26 DIAGNOSIS — D649 Anemia, unspecified: Secondary | ICD-10-CM | POA: Diagnosis not present

## 2021-11-26 DIAGNOSIS — R7881 Bacteremia: Secondary | ICD-10-CM

## 2021-11-26 DIAGNOSIS — M25551 Pain in right hip: Secondary | ICD-10-CM | POA: Diagnosis present

## 2021-11-26 LAB — CBC
HCT: 26.7 % — ABNORMAL LOW (ref 36.0–46.0)
Hemoglobin: 8.7 g/dL — ABNORMAL LOW (ref 12.0–15.0)
MCH: 28.9 pg (ref 26.0–34.0)
MCHC: 32.6 g/dL (ref 30.0–36.0)
MCV: 88.7 fL (ref 80.0–100.0)
Platelets: 166 10*3/uL (ref 150–400)
RBC: 3.01 MIL/uL — ABNORMAL LOW (ref 3.87–5.11)
RDW: 17.7 % — ABNORMAL HIGH (ref 11.5–15.5)
WBC: 8.6 10*3/uL (ref 4.0–10.5)
nRBC: 0 % (ref 0.0–0.2)

## 2021-11-26 LAB — BASIC METABOLIC PANEL
Anion gap: 9 (ref 5–15)
BUN: 19 mg/dL (ref 6–20)
CO2: 19 mmol/L — ABNORMAL LOW (ref 22–32)
Calcium: 8.3 mg/dL — ABNORMAL LOW (ref 8.9–10.3)
Chloride: 99 mmol/L (ref 98–111)
Creatinine, Ser: 1.69 mg/dL — ABNORMAL HIGH (ref 0.44–1.00)
GFR, Estimated: 36 mL/min — ABNORMAL LOW (ref >60)
Glucose, Bld: 98 mg/dL (ref 70–99)
Potassium: 3.5 mmol/L (ref 3.5–5.1)
Sodium: 127 mmol/L — ABNORMAL LOW (ref 135–145)

## 2021-11-26 MED ORDER — BUSPIRONE HCL 15 MG PO TABS
15.0000 mg | ORAL_TABLET | Freq: Two times a day (BID) | ORAL | Status: DC
  Administered 2021-11-26 – 2021-12-15 (×39): 15 mg via ORAL
  Filled 2021-11-26 (×40): qty 1

## 2021-11-26 MED ORDER — FOLIC ACID 1 MG PO TABS
1.0000 mg | ORAL_TABLET | Freq: Every day | ORAL | Status: DC
  Administered 2021-11-26 – 2021-12-15 (×20): 1 mg via ORAL
  Filled 2021-11-26 (×20): qty 1

## 2021-11-26 MED ORDER — THIAMINE HCL 100 MG PO TABS
100.0000 mg | ORAL_TABLET | Freq: Every day | ORAL | Status: DC
  Administered 2021-11-26 – 2021-12-15 (×20): 100 mg via ORAL
  Filled 2021-11-26 (×20): qty 1

## 2021-11-26 MED ORDER — SODIUM CHLORIDE 0.9 % IV SOLN: INTRAVENOUS | Status: DC

## 2021-11-26 MED ORDER — CARVEDILOL 3.125 MG PO TABS
3.1250 mg | ORAL_TABLET | Freq: Two times a day (BID) | ORAL | Status: DC
  Administered 2021-11-26 – 2021-12-15 (×37): 3.125 mg via ORAL
  Filled 2021-11-26 (×38): qty 1

## 2021-11-26 MED ORDER — CITALOPRAM HYDROBROMIDE 20 MG PO TABS
10.0000 mg | ORAL_TABLET | Freq: Every day | ORAL | Status: DC
  Administered 2021-11-26 – 2021-12-15 (×20): 10 mg via ORAL
  Filled 2021-11-26 (×20): qty 1

## 2021-11-26 MED ORDER — AMLODIPINE BESYLATE 5 MG PO TABS
5.0000 mg | ORAL_TABLET | Freq: Every day | ORAL | Status: DC
  Administered 2021-11-26 – 2021-12-09 (×14): 5 mg via ORAL
  Filled 2021-11-26 (×15): qty 1

## 2021-11-26 MED ORDER — POTASSIUM CHLORIDE CRYS ER 20 MEQ PO TBCR
40.0000 meq | EXTENDED_RELEASE_TABLET | Freq: Once | ORAL | Status: AC
Start: 2021-11-26 — End: 2021-11-26
  Administered 2021-11-26: 11:00:00 40 meq via ORAL
  Filled 2021-11-26: qty 2

## 2021-11-26 NOTE — Assessment & Plan Note (Addendum)
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 with a small to moderate-sized right hip joint effusion likely degenerative and reactive -- there was also intramuscular edema within the bilateral iliac [place] musculature within the adductor compartments of the bilateral hips right worse than left -Evaluated by orthopedic team and status post fluoroscopy guided needle aspiration of fluid collection demonstrated no growth x3 days -Continue Lyrica,  Robaxin and as needed Oxy IR -Patient aware that narcotics will not be increased and expectation is to wean completely from narcotics shortly after discharge -Continue to Ultram prn -Continue scheduled Tylenol every 12 hours -GFR too low to allow for NSAIDs -Plan outpatient PT once discharged -2/10 add lidocaine patch to bilateral hips Continue all of the above medications at discharge

## 2021-11-26 NOTE — Progress Notes (Signed)
Patient ID: Sherry Baker, female   DOB: [birth year], 56 y.o.   MRN: [id number]   LOS: 1 day   Subjective: Hand is feeling better   Objective: Vital signs in last 24 hours: Temp:  [97.9 F (36.6 C)-101.2 F (38.4 C)] 98.8 F (37.1 C) (01/24 0733) Pulse Rate:  [51-123] 105 (01/24 0733) Resp:  [14-20] 18 (01/24 0733) BP: (106-162)/(62-105) 162/78 (01/24 0733) SpO2:  [94 %-100 %] 100 % (01/24 0733)     Laboratory  CBC Recent Labs    11/25/21 0450 11/26/21 0133  WBC 10.3 8.6  HGB 9.1* 8.7*  HCT 28.0* 26.7*  PLT 159 166   BMET Recent Labs    11/25/21 2142 11/26/21 0133  NA 127* 127*  K 3.8 3.5  CL 100 99  CO2 18* 19*  GLUCOSE 106* 98  BUN 20 19  CREATININE 1.63* 1.69*  CALCIUM 8.1* 8.3*     Physical Exam General appearance: alert and no distress Left hand -- Erythema, edema, TTP all improved as has ROM. Bullae in the area have increased in size and now appear purulent. Some mild fluctuance underneath.   Assessment/Plan: Left hand cellulitis -- Improving on abx therapy. Will also start warm water soaks bid.    Lisette Abu, PA-C Orthopedic Surgery [phone] 11/26/2021

## 2021-11-26 NOTE — Progress Notes (Addendum)
[facility] PROGRESS NOTE   Sherry Baker QIH:[id number] DOB: [birth year] DOA: 11/25/2021  1 DOS: the patient was seen and examined on 11/26/2021  PCP: Pcp, No  Brief History and Hospital Course:  56 y.o. female with medical history significant for polysubstance abuse, including cocaine use as well as alcohol abuse, type I bipolar disorder, central hypertension, generalized anxiety disorder, stage IIIb chronic kidney disease with associated baseline creatinine 1.3-1.6, chronic anemia with baseline hemoglobin 10-12, who was admitted to [facility] on 11/18/2021 with acute metabolic encephalopathy after presenting from home to [facility] ED for evaluation of altered mental status.  Recently hospitalized from December 22 to January 4 for altered mental status.  At that time also she was on Precedex.  She went into respiratory failure had to be intubated.  Supposed to discharge to skilled nursing facility which the patient eventually declined and went home.  Presented back on 1/16 with altered mental status.  Again placed on Precedex infusion in the ICU.  Was found to have maculopapular rash which became pustular.  Etiology was unclear.  Left AMA from the hospital on 11/23/2021.  Patient return to the ER due to swelling of her hand.  There was also concern that the rash on her back could be from monkey [place].  Patient was hospitalized for further management.  Subsequently noted to have MRSA bacteremia.  Infectious disease was consulted.  Consultants: Orthopedics.  Infectious disease  Procedures: None    Subjective: Crying as usual.  Complains of pain in her right leg which has been ongoing for weeks.  Denies any recent falls or injuries.  Left hand still quite painful.  Denies shortness of breath.     Assessment/Plan:  * Cellulitis of left hand- (present on admission) Plain films did not show any air in the soft tissue but soft tissue swelling was noted.  No obvious abscess noted on  examination.  Patient started on vancomycin and ceftriaxone.  Seen by orthopedics.  Plan is to continue with the antibiotic treatment.  Pain control.   MRSA bacteremia- (present on admission) Likely from IVDU. On vanc already.  Infectious disease following.  Echocardiogram does not show any obvious vegetations.  She will need TEE.  Will inform cardiology.   Pustular rash- (present on admission) Initially concern was for bedbugs.  But this is a significantly pustular rash.  Differential diagnosis does include monkeypox.  Seen by ID and they feel that this is most likely secondary to MRSA.  Continue contact precautions.  Lesions appear to be improving.   Right hip pain- (present on admission) Proceed with imaging studies.   Polysubstance abuse ([place])- (present on admission) Patient with known history of IV drug use and polysubstance abuse.  Urine drug screen positive for benzodiazepine and cocaine and elevated rates.  CKD (chronic kidney disease), stage III ([place])- (present on admission) Chronic kidney disease stage IIIb Hyponatremia  Renal function noted to be close to baseline.  Monitor urine output.  Avoid nephrotoxic agents.  Sodium level noted to be low today.  Given normal saline confusion.  Recheck tomorrow.  Supplement potassium.  Hypertension- (present on admission) Resume amlodipine and carvedilol.  Anemia- (present on admission) Chronic for the most part.  Slight drop in hemoglobin is likely dilutional.  No overt bleeding noted.  Continue to monitor.    Hyponatremia Normal saline infusion.  Recheck labs tomorrow.  There could be an element of hypovolemia.  Severe malnutrition ([place])- (present on admission) Encourage oral intake.  Colostomy in  place [facility]) Seen by ostomy nurse.  GERD (gastroesophageal reflux disease) Chronic.     DVT Prophylaxis: Lovenox Code Status: Full code Family Communication: Discussed with patient Disposition Plan: Hopefully return home when  improved  Status is: Inpatient  Remains inpatient appropriate because: MRSA bacteremia     Medications: Scheduled:  amLODipine  5 mg Oral Daily   busPIRone  15 mg Oral BID   carvedilol  3.125 mg Oral BID WC   citalopram  10 mg Oral Daily   enoxaparin (LOVENOX) injection  30 mg Subcutaneous D78E   folic acid  1 mg Oral Daily   gabapentin  100 mg Oral TID   pantoprazole  40 mg Oral BID   potassium chloride  40 mEq Oral Once   senna-docusate  2 tablet Oral QHS   thiamine  100 mg Oral Daily   Continuous:  sodium chloride     [START ON 11/27/2021] vancomycin     UMP:[id number] **OR** acetaminophen, albuterol, haloperidol lactate, HYDROmorphone (DILAUDID) injection, hydrOXYzine, ondansetron **OR** ondansetron (ZOFRAN) IV, polyethylene glycol, traMADol  Antibiotics: Anti-infectives (From admission, onward)    Start     Dose/Rate Route Frequency Ordered Stop   11/27/21 0000  vancomycin (VANCOREADY) IVPB 750 mg/150 mL        750 mg 150 mL/hr over 60 Minutes Intravenous Every 48 hours 11/25/21 0422     11/26/21 0400  cefTRIAXone (ROCEPHIN) 1 g in sodium chloride 0.9 % 100 mL IVPB  Status:  Discontinued        1 g 200 mL/hr over 30 Minutes Intravenous Every 24 hours 11/25/21 0434 11/25/21 1149   11/25/21 0430  vancomycin (VANCOCIN) IVPB 1000 mg/200 mL premix        1,000 mg 200 mL/hr over 60 Minutes Intravenous  Once 11/25/21 0422 11/25/21 0552   11/25/21 0315  cefTRIAXone (ROCEPHIN) 2 g in sodium chloride 0.9 % 100 mL IVPB        2 g 200 mL/hr over 30 Minutes Intravenous  Once 11/25/21 0311 11/25/21 0435       Objective:  Vital Signs  Vitals:   11/25/21 2215 11/26/21 0010 11/26/21 0409 11/26/21 0733  BP:  106/76 129/67 (!) 162/78  Pulse:  99 (!) 102 (!) 105  Resp:  20 20 18   Temp: 99.2 F (37.3 C) 98.9 F (37.2 C) 97.9 F (36.6 C) 98.8 F (37.1 C)  TempSrc:  Oral Oral   SpO2:  94% 100% 100%  Weight:      Height:        Intake/Output Summary (Last 24 hours)  at 11/26/2021 0923 Last data filed at 11/26/2021 0841 Gross per 24 hour  Intake 600 ml  Output 500 ml  Net 100 ml   Filed Weights   11/25/21 0136  Weight: 46 kg    General appearance: Awake alert.  In no distress Resp: Clear to auscultation bilaterally.  Normal effort Cardio: S1-S2 is normal regular.  No S3-S4.  No rubs murmurs or bruit GI: Abdomen is soft.  Nontender nondistended.  Bowel sounds are present normal.  No masses organomegaly.  Ostomy is noted. Extremities: Left hand continues to be swollen with dorsal erythema.  Warm to touch.  Limited range of motion of the wrist and the hand.  Also has limited range of motion of the right hip. Neurologic: Alert and oriented x3.  No focal neurological deficits.     Lab Results:  Data Reviewed: I have personally reviewed labs and imaging study reports  CBC: Recent Labs  Lab 11/20/21 0142 11/25/21 0158 11/25/21 0450 11/26/21 0133  WBC 5.3 12.0* 10.3 8.6  NEUTROABS 3.6 10.1* 8.6*  --   HGB 9.4* 10.0* 9.1* 8.7*  HCT 30.3* 32.2* 28.0* 26.7*  MCV 91.8 91.5 89.7 88.7  PLT 253 196 159 998    Basic Metabolic Panel: Recent Labs  Lab 11/20/21 0142 11/20/21 1301 11/21/21 0131 11/22/21 0340 11/23/21 0600 11/25/21 0158 11/25/21 2142 11/26/21 0133  NA 150*   < > 141 135 137 130* 127* 127*  K 3.6   < > 3.5 3.6 4.0 4.2 3.8 3.5  CL 117*   < > 113* 109 107 101 100 99  CO2 16*   < > 21* 20* 19* 17* 18* 19*  GLUCOSE 92   < > 108* 107* 106* 125* 106* 98  BUN 42*   < > 34* 28* 26* 22* 20 19  CREATININE 1.60*   < > 1.69* 1.73* 1.62* 1.72* 1.63* 1.69*  CALCIUM 9.8   < > 9.6 8.2* 9.0 8.5* 8.1* 8.3*  MG 1.9  --  2.0 1.7  --  2.0  --   --   PHOS 3.6  --   --   --   --   --   --   --    < > = values in this interval not displayed.    GFR: Estimated Creatinine Clearance: 27.6 mL/min (A) (by C-G formula based on SCr of 1.69 mg/dL (H)).  Liver Function Tests: Recent Labs  Lab 11/20/21 0142 11/25/21 0158 11/25/21 2142  AST 30 17  16   ALT 22 15 13   ALKPHOS 59 68 66  BILITOT 0.8 0.3 0.4  PROT 7.2 7.8 6.9  ALBUMIN 3.0* 2.9* 2.4*    No results for input(s): AMMONIA in the last 168 hours.   Coagulation Profile: Recent Labs  Lab 11/25/21 0158  INR 1.2    Cardiac Enzymes: Recent Labs  Lab 11/20/21 0142  CKTOTAL 226     CBG: Recent Labs  Lab 11/21/21 2347 11/22/21 0353 11/22/21 0803 11/22/21 1111 11/22/21 2009  GLUCAP 97 102* 83 118* 104*     Recent Results (from the past 240 hour(s))  Resp Panel by RT-PCR (Flu A&B, Covid) Nasopharyngeal Swab     Status: None   Collection Time: 11/18/21  7:10 PM   Specimen: Nasopharyngeal Swab; Nasopharyngeal(NP) swabs in vial transport medium  Result Value Ref Range Status   SARS Coronavirus 2 by RT PCR NEGATIVE NEGATIVE Final    Comment: (NOTE) SARS-CoV-2 target nucleic acids are NOT DETECTED.  The SARS-CoV-2 RNA is generally detectable in upper respiratory specimens during the acute phase of infection. The lowest concentration of SARS-CoV-2 viral copies this assay can detect is 138 copies/mL. A negative result does not preclude SARS-Cov-2 infection and should not be used as the sole basis for treatment or other patient management decisions. A negative result may occur with  improper specimen collection/handling, submission of specimen other than nasopharyngeal swab, presence of viral mutation(s) within the areas targeted by this assay, and inadequate number of viral copies(<138 copies/mL). A negative result must be combined with clinical observations, patient history, and epidemiological information. The expected result is Negative.  Fact Sheet for Patients:  EntrepreneurPulse.com.au  Fact Sheet for Healthcare Providers:  IncredibleEmployment.be  This test is no t yet approved or cleared by the [place] FDA and  has been authorized for detection and/or diagnosis of SARS-CoV-2 by FDA under an Emergency Use  Authorization (EUA). This EUA will remain  in effect (meaning this test can be used) for the duration of the COVID-19 declaration under Section 564(b)(1) of the Act, 21 U.S.C.section 360bbb-3(b)(1), unless the authorization is terminated  or revoked sooner.       Influenza A by PCR NEGATIVE NEGATIVE Final   Influenza B by PCR NEGATIVE NEGATIVE Final    Comment: (NOTE) The Xpert Xpress SARS-CoV-2/FLU/RSV plus assay is intended as an aid in the diagnosis of influenza from Nasopharyngeal swab specimens and should not be used as a sole basis for treatment. Nasal washings and aspirates are unacceptable for Xpert Xpress SARS-CoV-2/FLU/RSV testing.  Fact Sheet for Patients: EntrepreneurPulse.com.au  Fact Sheet for Healthcare Providers: IncredibleEmployment.be  This test is not yet approved or cleared by the [place] FDA and has been authorized for detection and/or diagnosis of SARS-CoV-2 by FDA under an Emergency Use Authorization (EUA). This EUA will remain in effect (meaning this test can be used) for the duration of the COVID-19 declaration under Section 564(b)(1) of the Act, 21 U.S.C. section 360bbb-3(b)(1), unless the authorization is terminated or revoked.  Performed at [facility], [place] [address], [place], [place] [address]   Culture, blood (Routine x 2)     Status: Abnormal (Preliminary result)   Collection Time: 11/25/21  1:58 AM   Specimen: BLOOD  Result Value Ref Range Status   Specimen Description BLOOD RIGHT ANTECUBITAL  Final   Special Requests   Final    BOTTLES DRAWN AEROBIC AND ANAEROBIC Blood Culture adequate volume   Culture  Setup Time   Final    GRAM POSITIVE COCCI IN CLUSTERS IN BOTH AEROBIC AND ANAEROBIC BOTTLES Organism ID to follow CRITICAL RESULT CALLED TO, READ BACK BY AND VERIFIED WITH: J. FRENS PHARMD, AT 3212 11/25/21 D. VANHOOK    Culture (A)  Final    STAPHYLOCOCCUS AUREUS SUSCEPTIBILITIES TO  FOLLOW Performed at [facility], [place] [address], [place], [place] [address]    Report Status PENDING  Incomplete  Blood Culture ID Panel (Reflexed)     Status: Abnormal   Collection Time: 11/25/21  1:58 AM  Result Value Ref Range Status   Enterococcus faecalis NOT DETECTED NOT DETECTED Final   Enterococcus Faecium NOT DETECTED NOT DETECTED Final   Listeria monocytogenes NOT DETECTED NOT DETECTED Final   Staphylococcus species DETECTED (A) NOT DETECTED Final    Comment: CRITICAL RESULT CALLED TO, READ BACK BY AND VERIFIED WITH: J. FRENS PHARMD, AT 0037 11/25/21 D. VANHOOK    Staphylococcus aureus (BCID) DETECTED (A) NOT DETECTED Final    Comment: Methicillin (oxacillin)-resistant Staphylococcus aureus (MRSA). MRSA is predictably resistant to beta-lactam antibiotics (except ceftaroline). Preferred therapy is vancomycin unless clinically contraindicated. Patient requires contact precautions if  hospitalized. CRITICAL RESULT CALLED TO, READ BACK BY AND VERIFIED WITH: J. FRENS PHARMD, AT 0488 11/25/21 D. VANHOOK    Staphylococcus epidermidis NOT DETECTED NOT DETECTED Final   Staphylococcus lugdunensis NOT DETECTED NOT DETECTED Final   Streptococcus species NOT DETECTED NOT DETECTED Final   Streptococcus agalactiae NOT DETECTED NOT DETECTED Final   Streptococcus pneumoniae NOT DETECTED NOT DETECTED Final   Streptococcus pyogenes NOT DETECTED NOT DETECTED Final   A.calcoaceticus-baumannii NOT DETECTED NOT DETECTED Final   Bacteroides fragilis NOT DETECTED NOT DETECTED Final   Enterobacterales NOT DETECTED NOT DETECTED Final   Enterobacter cloacae complex NOT DETECTED NOT DETECTED Final   Escherichia coli NOT DETECTED NOT DETECTED Final   Klebsiella aerogenes NOT DETECTED NOT DETECTED Final   Klebsiella oxytoca NOT DETECTED NOT  DETECTED Final   Klebsiella pneumoniae NOT DETECTED NOT DETECTED Final   Proteus species NOT DETECTED NOT DETECTED Final   Salmonella species NOT DETECTED NOT  DETECTED Final   Serratia marcescens NOT DETECTED NOT DETECTED Final   Haemophilus influenzae NOT DETECTED NOT DETECTED Final   Neisseria meningitidis NOT DETECTED NOT DETECTED Final   Pseudomonas aeruginosa NOT DETECTED NOT DETECTED Final   Stenotrophomonas maltophilia NOT DETECTED NOT DETECTED Final   Candida albicans NOT DETECTED NOT DETECTED Final   Candida auris NOT DETECTED NOT DETECTED Final   Candida glabrata NOT DETECTED NOT DETECTED Final   Candida krusei NOT DETECTED NOT DETECTED Final   Candida parapsilosis NOT DETECTED NOT DETECTED Final   Candida tropicalis NOT DETECTED NOT DETECTED Final   Cryptococcus neoformans/gattii NOT DETECTED NOT DETECTED Final   Meth resistant mecA/C and MREJ DETECTED (A) NOT DETECTED Final    Comment: CRITICAL RESULT CALLED TO, READ BACK BY AND VERIFIED WITH: J. FRENS PHARMD, AT 1096 11/25/21 D. VANHOOK Performed at [facility], [place] [address] [place], [place] [address]   Culture, blood (Routine x 2)     Status: None (Preliminary result)   Collection Time: 11/25/21  2:05 AM   Specimen: BLOOD RIGHT HAND  Result Value Ref Range Status   Specimen Description BLOOD RIGHT HAND  Final   Special Requests   Final    BOTTLES DRAWN AEROBIC AND ANAEROBIC Blood Culture adequate volume   Culture  Setup Time   Final    GRAM POSITIVE COCCI IN BOTH AEROBIC AND ANAEROBIC BOTTLES CRITICAL VALUE NOTED.  VALUE IS CONSISTENT WITH PREVIOUSLY REPORTED AND CALLED VALUE.    Culture   Final    NO GROWTH 1 DAY Performed at [facility], [place] [address], [place], [place] [address]    Report Status PENDING  Incomplete  Resp Panel by RT-PCR (Flu A&B, Covid) Nasopharyngeal Swab     Status: None   Collection Time: 11/25/21  4:20 AM   Specimen: Nasopharyngeal Swab; Nasopharyngeal(NP) swabs in vial transport medium  Result Value Ref Range Status   SARS Coronavirus 2 by RT PCR NEGATIVE NEGATIVE Final    Comment: (NOTE) SARS-CoV-2 target nucleic acids are NOT  DETECTED.  The SARS-CoV-2 RNA is generally detectable in upper respiratory specimens during the acute phase of infection. The lowest concentration of SARS-CoV-2 viral copies this assay can detect is 138 copies/mL. A negative result does not preclude SARS-Cov-2 infection and should not be used as the sole basis for treatment or other patient management decisions. A negative result may occur with  improper specimen collection/handling, submission of specimen other than nasopharyngeal swab, presence of viral mutation(s) within the areas targeted by this assay, and inadequate number of viral copies(<138 copies/mL). A negative result must be combined with clinical observations, patient history, and epidemiological information. The expected result is Negative.  Fact Sheet for Patients:  EntrepreneurPulse.com.au  Fact Sheet for Healthcare Providers:  IncredibleEmployment.be  This test is no t yet approved or cleared by the [place] FDA and  has been authorized for detection and/or diagnosis of SARS-CoV-2 by FDA under an Emergency Use Authorization (EUA). This EUA will remain  in effect (meaning this test can be used) for the duration of the COVID-19 declaration under Section 564(b)(1) of the Act, 21 U.S.C.section 360bbb-3(b)(1), unless the authorization is terminated  or revoked sooner.       Influenza A by PCR NEGATIVE NEGATIVE Final   Influenza B by PCR NEGATIVE NEGATIVE Final  Comment: (NOTE) The Xpert Xpress SARS-CoV-2/FLU/RSV plus assay is intended as an aid in the diagnosis of influenza from Nasopharyngeal swab specimens and should not be used as a sole basis for treatment. Nasal washings and aspirates are unacceptable for Xpert Xpress SARS-CoV-2/FLU/RSV testing.  Fact Sheet for Patients: EntrepreneurPulse.com.au  Fact Sheet for Healthcare Providers: IncredibleEmployment.be  This test is not yet  approved or cleared by the [place] FDA and has been authorized for detection and/or diagnosis of SARS-CoV-2 by FDA under an Emergency Use Authorization (EUA). This EUA will remain in effect (meaning this test can be used) for the duration of the COVID-19 declaration under Section 564(b)(1) of the Act, 21 U.S.C. section 360bbb-3(b)(1), unless the authorization is terminated or revoked.  Performed at [facility], [place] [address], [place], [place] [address]   MRSA Next Gen by PCR, Nasal     Status: Abnormal   Collection Time: 11/25/21  2:00 PM  Result Value Ref Range Status   MRSA by PCR Next Gen DETECTED (A) NOT DETECTED Final    Comment: MELAINIE FLORES RN 11/25/21 @2009  BY JW (NOTE) The GeneXpert MRSA Assay (FDA approved for NASAL specimens only), is one component of a comprehensive MRSA colonization surveillance program. It is not intended to diagnose MRSA infection nor to guide or monitor treatment for MRSA infections. Test performance is not FDA approved in patients less than 58 years old. Performed at [facility], [place] [address] [place], [place] [address]       Radiology Studies: DG Chest 2 View  Result Date: 11/25/2021 CLINICAL DATA:  Chest pain. EXAM: CHEST - 2 VIEW COMPARISON:  Radiograph dated 11/20/2021. FINDINGS: Minimal bibasilar atelectasis. No focal consolidation, pleural effusion, pneumothorax. The cardiac silhouette is within normal limits. No acute osseous pathology. IMPRESSION: No active cardiopulmonary disease. Electronically Signed   By: Anner Crete M.D.   On: 11/25/2021 03:00   DG Hand Complete Left  Result Date: 11/25/2021 CLINICAL DATA:  Swelling and inflammation in the anterior aspect of the second and third metatarsals. EXAM: LEFT HAND - COMPLETE 3+ VIEW COMPARISON:  None. FINDINGS: There is no evidence of fracture or dislocation. Degenerative changes are noted at the wrist. Soft tissue swelling is present over the dorsum of the hand  and digits. Vascular calcifications are present in the soft tissues. IMPRESSION: Diffuse soft tissue swelling over the dorsum of the hand and digits. No acute osseous abnormality. Electronically Signed   By: Brett Fairy M.D.   On: 11/25/2021 03:03   ECHOCARDIOGRAM COMPLETE  Result Date: 11/25/2021    ECHOCARDIOGRAM REPORT   Patient Name:   Sherry Baker Date of Exam: 11/25/2021 Medical Rec #:  [id number]      Height:       62.0 in Accession #:    [id number]     Weight:       101.4 lb Date of Birth:  11-Aug-1966       BSA:          1.432 m Patient Age:    21 years       BP:           142/78 mmHg Patient Gender: F              HR:           123 bpm. Exam Location:  Inpatient Procedure: 2D Echo, Cardiac Doppler and Color Doppler Indications:    Bacteremia  History:        Patient  has prior history of Echocardiogram examinations, most                 recent 09/06/2021. Risk Factors:Hypertension.  Sonographer:    Glo Herring Referring Phys: [place]  1. Abnormal septal motion. Left ventricular ejection fraction, by estimation, is 55%. The left ventricle has normal function. The left ventricle has no regional wall motion abnormalities. Left ventricular diastolic parameters are consistent with Grade I  diastolic dysfunction (impaired relaxation).  2. Right ventricular systolic function is normal. The right ventricular size is normal.  3. The mitral valve is abnormal. Trivial mitral valve regurgitation. No evidence of mitral stenosis.  4. The aortic valve is normal in structure. Aortic valve regurgitation is not visualized. No aortic stenosis is present.  5. The inferior vena cava is normal in size with greater than 50% respiratory variability, suggesting right atrial pressure of 3 mmHg. FINDINGS  Left Ventricle: Abnormal septal motion. Left ventricular ejection fraction, by estimation, is 55%. The left ventricle has normal function. The left ventricle has no regional wall motion abnormalities.  The left ventricular internal cavity size was normal  in size. There is no left ventricular hypertrophy. Left ventricular diastolic parameters are consistent with Grade I diastolic dysfunction (impaired relaxation). Right Ventricle: The right ventricular size is normal. No increase in right ventricular wall thickness. Right ventricular systolic function is normal. Left Atrium: Left atrial size was normal in size. Right Atrium: Right atrial size was normal in size. Pericardium: There is no evidence of pericardial effusion. Mitral Valve: The mitral valve is abnormal. There is mild thickening of the mitral valve leaflet(s). There is mild calcification of the mitral valve leaflet(s). Mild mitral annular calcification. Trivial mitral valve regurgitation. No evidence of mitral valve stenosis. Tricuspid Valve: The tricuspid valve is normal in structure. Tricuspid valve regurgitation is not demonstrated. No evidence of tricuspid stenosis. Aortic Valve: The aortic valve is normal in structure. Aortic valve regurgitation is not visualized. No aortic stenosis is present. Aortic valve mean gradient measures 7.0 mmHg. Aortic valve peak gradient measures 12.2 mmHg. Aortic valve area, by VTI measures 1.80 cm. Pulmonic Valve: The pulmonic valve was normal in structure. Pulmonic valve regurgitation is not visualized. No evidence of pulmonic stenosis. Aorta: The aortic root is normal in size and structure. Venous: The inferior vena cava is normal in size with greater than 50% respiratory variability, suggesting right atrial pressure of 3 mmHg. IAS/Shunts: No atrial level shunt detected by color flow Doppler.  LEFT VENTRICLE PLAX 2D LVOT diam:     1.90 cm   Diastology LV SV:         49        LV e' medial:  5.66 cm/s LV SV Index:   34        LV e' lateral: 11.00 cm/s LVOT Area:     2.84 cm  RIGHT VENTRICLE             IVC RV Basal diam:  2.60 cm     IVC diam: 1.70 cm RV S prime:     16.30 cm/s LEFT ATRIUM             Index         RIGHT ATRIUM           Index LA Vol (A2C):   47.3 ml 33.02 ml/m  RA Area:     12.10 cm LA Vol (A4C):   35.5 ml 24.78 ml/m  RA Volume:   24.40  ml  17.03 ml/m LA Biplane Vol: 40.9 ml 28.55 ml/m  AORTIC VALVE                     PULMONIC VALVE AV Area (Vmax):    1.80 cm      PV Vmax:       1.02 m/s AV Area (Vmean):   1.65 cm      PV Peak grad:  4.2 mmHg AV Area (VTI):     1.80 cm AV Vmax:           175.00 cm/s AV Vmean:          125.000 cm/s AV VTI:            0.273 m AV Peak Grad:      12.2 mmHg AV Mean Grad:      7.0 mmHg LVOT Vmax:         111.00 cm/s LVOT Vmean:        72.800 cm/s LVOT VTI:          0.173 m LVOT/AV VTI ratio: 0.63  AORTA Ao Root diam: 2.80 cm  SHUNTS Systemic VTI:  0.17 m Systemic Diam: 1.90 cm Jenkins Rouge MD Electronically signed by Jenkins Rouge MD Signature Date/Time: 11/25/2021/4:31:16 PM    Final        LOS: 1 day   [facility] Pager on [email or web address]  11/26/2021, 9:23 AM

## 2021-11-26 NOTE — Progress Notes (Signed)
[place] for Infectious Disease    Date of Admission:  11/25/2021   Total days of antibiotics 2/vancomycin   ID: Sherry Baker is a 56 y.o. female with  MRSA bacteremia, hand cellulitis and pustules Principal Problem:   Cellulitis of left hand Active Problems:   Severe malnutrition (HCC)   Anemia   Hypertension   Polysubstance abuse ([place])   Pustular rash   Hyponatremia   CKD (chronic kidney disease), stage III (HCC)   Colostomy in place [facility])   MRSA bacteremia   Right hip pain    Subjective: Still has ongoing pain but improved in her hand, less tender, less swollen 24hr - no diarrhea, or new rash; had TTE no vegetations  Medications:   amLODipine  5 mg Oral Daily   busPIRone  15 mg Oral BID   carvedilol  3.125 mg Oral BID WC   citalopram  10 mg Oral Daily   enoxaparin (LOVENOX) injection  30 mg Subcutaneous Y65L   folic acid  1 mg Oral Daily   gabapentin  100 mg Oral TID   pantoprazole  40 mg Oral BID   senna-docusate  2 tablet Oral QHS   thiamine  100 mg Oral Daily    Objective: Vital signs in last 24 hours: Temp:  [97.9 F (36.6 C)-101.2 F (38.4 C)] 100.8 F (38.2 C) (01/24 1420) Pulse Rate:  [51-105] 100 (01/24 1420) Resp:  [16-20] 17 (01/24 1420) BP: (106-162)/(65-105) 124/80 (01/24 1420) SpO2:  [94 %-100 %] 98 % (01/24 1420) Physical Exam  Constitutional:  oriented to person, place, and time. appears ill appearing and well-nourished. No distress.  HENT: [place]/AT, PERRLA, no scleral icterus Mouth/Throat: Oropharynx is clear and moist. No oropharyngeal exudate.  Cardiovascular: Normal rate, regular rhythm and normal heart sounds. Exam reveals no gallop and no friction rub.  No murmur heard.  Pulmonary/Chest: Effort normal and breath sounds normal. No respiratory distress.  has no wheezes.  Neck = supple, no nuchal rigidity Abdominal: Soft. Bowel sounds are normal.  exhibits no distension. There is no tenderness. Ostomy in place Ext: left arm  swelling and erythema improved Skin: Skin is warm and dry. Diffuse pustules on back still present. unchanged Psychiatric: a normal mood and affect.  behavior is normal.    Lab Results Recent Labs    11/25/21 0450 11/25/21 2142 11/26/21 0133  WBC 10.3  --  8.6  HGB 9.1*  --  8.7*  HCT 28.0*  --  26.7*  NA  --  127* 127*  K  --  3.8 3.5  CL  --  100 99  CO2  --  18* 19*  BUN  --  20 19  CREATININE  --  1.63* 1.69*   Liver Panel Recent Labs    11/25/21 0158 11/25/21 2142  PROT 7.8 6.9  ALBUMIN 2.9* 2.4*  AST 17 16  ALT 15 13  ALKPHOS 68 66  BILITOT 0.3 0.4     Microbiology: 1/23 blood cx MRSA Studies/Results: DG Chest 2 View  Result Date: 11/25/2021 CLINICAL DATA:  Chest pain. EXAM: CHEST - 2 VIEW COMPARISON:  Radiograph dated 11/20/2021. FINDINGS: Minimal bibasilar atelectasis. No focal consolidation, pleural effusion, pneumothorax. The cardiac silhouette is within normal limits. No acute osseous pathology. IMPRESSION: No active cardiopulmonary disease. Electronically Signed   By: Anner Crete M.D.   On: 11/25/2021 03:00   DG Hand Complete Left  Result Date: 11/25/2021 CLINICAL DATA:  Swelling and inflammation in the anterior aspect of  the second and third metatarsals. EXAM: LEFT HAND - COMPLETE 3+ VIEW COMPARISON:  None. FINDINGS: There is no evidence of fracture or dislocation. Degenerative changes are noted at the wrist. Soft tissue swelling is present over the dorsum of the hand and digits. Vascular calcifications are present in the soft tissues. IMPRESSION: Diffuse soft tissue swelling over the dorsum of the hand and digits. No acute osseous abnormality. Electronically Signed   By: Brett Fairy M.D.   On: 11/25/2021 03:03   ECHOCARDIOGRAM COMPLETE  Result Date: 11/25/2021    ECHOCARDIOGRAM REPORT   Patient Name:   Sherry Baker Date of Exam: 11/25/2021 Medical Rec #:  [id number]      Height:       62.0 in Accession #:    [id number]     Weight:       101.4 lb  Date of Birth:  [birth year]       BSA:          1.432 m Patient Age:    74 years       BP:           142/78 mmHg Patient Gender: F              HR:           123 bpm. Exam Location:  Inpatient Procedure: 2D Echo, Cardiac Doppler and Color Doppler Indications:    Bacteremia  History:        Patient has prior history of Echocardiogram examinations, most                 recent 09/06/2021. Risk Factors:Hypertension.  Sonographer:    Glo Herring Referring Phys: Miranda  1. Abnormal septal motion. Left ventricular ejection fraction, by estimation, is 55%. The left ventricle has normal function. The left ventricle has no regional wall motion abnormalities. Left ventricular diastolic parameters are consistent with Grade I  diastolic dysfunction (impaired relaxation).  2. Right ventricular systolic function is normal. The right ventricular size is normal.  3. The mitral valve is abnormal. Trivial mitral valve regurgitation. No evidence of mitral stenosis.  4. The aortic valve is normal in structure. Aortic valve regurgitation is not visualized. No aortic stenosis is present.  5. The inferior vena cava is normal in size with greater than 50% respiratory variability, suggesting right atrial pressure of 3 mmHg. FINDINGS  Left Ventricle: Abnormal septal motion. Left ventricular ejection fraction, by estimation, is 55%. The left ventricle has normal function. The left ventricle has no regional wall motion abnormalities. The left ventricular internal cavity size was normal  in size. There is no left ventricular hypertrophy. Left ventricular diastolic parameters are consistent with Grade I diastolic dysfunction (impaired relaxation). Right Ventricle: The right ventricular size is normal. No increase in right ventricular wall thickness. Right ventricular systolic function is normal. Left Atrium: Left atrial size was normal in size. Right Atrium: Right atrial size was normal in size. Pericardium: There is no  evidence of pericardial effusion. Mitral Valve: The mitral valve is abnormal. There is mild thickening of the mitral valve leaflet(s). There is mild calcification of the mitral valve leaflet(s). Mild mitral annular calcification. Trivial mitral valve regurgitation. No evidence of mitral valve stenosis. Tricuspid Valve: The tricuspid valve is normal in structure. Tricuspid valve regurgitation is not demonstrated. No evidence of tricuspid stenosis. Aortic Valve: The aortic valve is normal in structure. Aortic valve regurgitation is not visualized. No aortic stenosis is present. Aortic valve mean gradient measures  7.0 mmHg. Aortic valve peak gradient measures 12.2 mmHg. Aortic valve area, by VTI measures 1.80 cm. Pulmonic Valve: The pulmonic valve was normal in structure. Pulmonic valve regurgitation is not visualized. No evidence of pulmonic stenosis. Aorta: The aortic root is normal in size and structure. Venous: The inferior vena cava is normal in size with greater than 50% respiratory variability, suggesting right atrial pressure of 3 mmHg. IAS/Shunts: No atrial level shunt detected by color flow Doppler.  LEFT VENTRICLE PLAX 2D LVOT diam:     1.90 cm   Diastology LV SV:         49        LV e' medial:  5.66 cm/s LV SV Index:   34        LV e' lateral: 11.00 cm/s LVOT Area:     2.84 cm  RIGHT VENTRICLE             IVC RV Basal diam:  2.60 cm     IVC diam: 1.70 cm RV S prime:     16.30 cm/s LEFT ATRIUM             Index        RIGHT ATRIUM           Index LA Vol (A2C):   47.3 ml 33.02 ml/m  RA Area:     12.10 cm LA Vol (A4C):   35.5 ml 24.78 ml/m  RA Volume:   24.40 ml  17.03 ml/m LA Biplane Vol: 40.9 ml 28.55 ml/m  AORTIC VALVE                     PULMONIC VALVE AV Area (Vmax):    1.80 cm      PV Vmax:       1.02 m/s AV Area (Vmean):   1.65 cm      PV Peak grad:  4.2 mmHg AV Area (VTI):     1.80 cm AV Vmax:           175.00 cm/s AV Vmean:          125.000 cm/s AV VTI:            0.273 m AV Peak Grad:       12.2 mmHg AV Mean Grad:      7.0 mmHg LVOT Vmax:         111.00 cm/s LVOT Vmean:        72.800 cm/s LVOT VTI:          0.173 m LVOT/AV VTI ratio: 0.63  AORTA Ao Root diam: 2.80 cm  SHUNTS Systemic VTI:  0.17 m Systemic Diam: 1.90 cm Jenkins Rouge MD Electronically signed by Jenkins Rouge MD Signature Date/Time: 11/25/2021/4:31:16 PM    Final    DG HIP PORT UNILAT WITH PELVIS 1V RIGHT  Result Date: 11/26/2021 CLINICAL DATA:  Pain right hip EXAM: DG HIP (WITH OR WITHOUT PELVIS) 1V PORT RIGHT COMPARISON:  None. FINDINGS: No definite recent fracture or dislocation is seen. Severe degenerative changes are noted in the right hip with obliteration of joint space, large bony spurs and subcortical cysts. Severe degenerative changes are also noted to a lesser extent in the left hip. IMPRESSION: No recent fracture or dislocation is seen. 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   On: 11/26/2021 13:56     Assessment/Plan: MRSA bacteremia = possibly secondary to left arm cellulitis or pustules on  torsoe. Continue with vancomycin. May need to consider changing to daptomycin if CKD worsens. For now appears to tolerate dosing. Repeat blood cx tomorrow. Recommend to get TEE  Left arm cellulitis = improving. Has superficial pustules/bullae on dorsum of hand now that is evolving  MRSA pustular rash on torso = continue with vancomycin  Sacral decub = continue with wound care [place] for Infectious Diseases Pager: [phone]  11/26/2021, 5:08 PM

## 2021-11-26 NOTE — Consult Note (Signed)
WOC Nurse Consult Note: Patient receiving care in [facility] [phone]. Patient was seen by [facility] NP K. Sanders while in the ED yesterday. Orders for ostomy and wound care are in the record.  I have modified these to reflect "[place]".  No new needs identified. Please see WOC note from yesterday if desired.  [place] nurse will not follow at this time.  Please re-consult the [place] team if needed.  Val Riles, RN, MSN, CWOCN, CNS-BC, pager [phone]

## 2021-11-27 ENCOUNTER — Other Ambulatory Visit: Payer: Self-pay

## 2021-11-27 DIAGNOSIS — L03114 Cellulitis of left upper limb: Secondary | ICD-10-CM | POA: Diagnosis not present

## 2021-11-27 DIAGNOSIS — F1910 Other psychoactive substance abuse, uncomplicated: Secondary | ICD-10-CM | POA: Diagnosis not present

## 2021-11-27 DIAGNOSIS — B9562 Methicillin resistant Staphylococcus aureus infection as the cause of diseases classified elsewhere: Secondary | ICD-10-CM | POA: Diagnosis not present

## 2021-11-27 DIAGNOSIS — R7881 Bacteremia: Secondary | ICD-10-CM | POA: Diagnosis not present

## 2021-11-27 LAB — BASIC METABOLIC PANEL
Anion gap: 10 (ref 5–15)
BUN: 19 mg/dL (ref 6–20)
CO2: 17 mmol/L — ABNORMAL LOW (ref 22–32)
Calcium: 8.6 mg/dL — ABNORMAL LOW (ref 8.9–10.3)
Chloride: 98 mmol/L (ref 98–111)
Creatinine, Ser: 1.45 mg/dL — ABNORMAL HIGH (ref 0.44–1.00)
GFR, Estimated: 43 mL/min — ABNORMAL LOW (ref >60)
Glucose, Bld: 114 mg/dL — ABNORMAL HIGH (ref 70–99)
Potassium: 4 mmol/L (ref 3.5–5.1)
Sodium: 125 mmol/L — ABNORMAL LOW (ref 135–145)

## 2021-11-27 LAB — CBC
HCT: 25.6 % — ABNORMAL LOW (ref 36.0–46.0)
Hemoglobin: 8.1 g/dL — ABNORMAL LOW (ref 12.0–15.0)
MCH: 28.3 pg (ref 26.0–34.0)
MCHC: 31.6 g/dL (ref 30.0–36.0)
MCV: 89.5 fL (ref 80.0–100.0)
Platelets: 176 10*3/uL (ref 150–400)
RBC: 2.86 MIL/uL — ABNORMAL LOW (ref 3.87–5.11)
RDW: 17.1 % — ABNORMAL HIGH (ref 11.5–15.5)
WBC: 7.2 10*3/uL (ref 4.0–10.5)
nRBC: 0 % (ref 0.0–0.2)

## 2021-11-27 LAB — CULTURE, BLOOD (ROUTINE X 2)
Special Requests: ADEQUATE
Special Requests: ADEQUATE

## 2021-11-27 LAB — SODIUM, URINE, RANDOM: Sodium, Ur: 81 mmol/L

## 2021-11-27 LAB — OSMOLALITY, URINE: Osmolality, Ur: 318 mOsm/kg (ref 300–900)

## 2021-11-27 MED ORDER — VANCOMYCIN HCL 750 MG/150ML IV SOLN: 750.0000 mg | INTRAVENOUS | Status: DC

## 2021-11-27 MED ORDER — GABAPENTIN 100 MG PO CAPS
200.0000 mg | ORAL_CAPSULE | Freq: Three times a day (TID) | ORAL | Status: DC
  Administered 2021-11-27 – 2021-12-10 (×39): 200 mg via ORAL
  Filled 2021-11-27 (×39): qty 2

## 2021-11-27 MED ORDER — CHLORHEXIDINE GLUCONATE CLOTH 2 % EX PADS
6.0000 | MEDICATED_PAD | Freq: Every day | CUTANEOUS | Status: DC
  Administered 2021-11-27 – 2021-12-13 (×16): 6 via TOPICAL

## 2021-11-27 MED ORDER — VANCOMYCIN HCL 500 MG/100ML IV SOLN
500.0000 mg | INTRAVENOUS | Status: DC
  Administered 2021-11-28 – 2021-11-29 (×2): 500 mg via INTRAVENOUS
  Filled 2021-11-27 (×2): qty 100

## 2021-11-27 MED ORDER — MUPIROCIN 2 % EX OINT
TOPICAL_OINTMENT | Freq: Two times a day (BID) | CUTANEOUS | Status: DC
  Administered 2021-11-27: 1 via NASAL
  Filled 2021-11-27 (×2): qty 22
  Filled 2021-11-27: qty 44
  Filled 2021-11-27: qty 22

## 2021-11-27 NOTE — Progress Notes (Signed)
Pharmacy Antibiotic Note  Sherry Baker is a 56 y.o. female admitted on 11/25/2021 with MRSA cellulitis.  Pharmacy has been consulted for vancomycin dosing.  Scr improved to 1.45, UOP recorded 750 ml/24h  Plan: Change Vancomycin to 500mg  IV q24h (est AUC 480, SCr 1.45) Will f/u renal function and pt's clinical condition Plan for levels at Css  Height: 5\' 2"  (157.5 cm) Weight: 46 kg (101 lb 6.6 oz) IBW/kg (Calculated) : 50.1  Temp (24hrs), Avg:99.2 F (37.3 C), Min:98.7 F (37.1 C), Max:100.8 F (38.2 C)  Recent Labs  Lab 11/23/21 0600 11/25/21 0158 11/25/21 0350 11/25/21 0450 11/25/21 2142 11/26/21 0133 11/27/21 0133  WBC  --  12.0*  --  10.3  --  8.6 7.2  CREATININE 1.62* 1.72*  --   --  1.63* 1.69* 1.45*  LATICACIDVEN  --  1.8 0.9  --   --   --   --      Estimated Creatinine Clearance: 32.2 mL/min (A) (by C-G formula based on SCr of 1.45 mg/dL (H)).    Allergies  Allergen Reactions   Penicillins Hives and Swelling    Has patient had a PCN reaction causing immediate rash, facial/tongue/throat swelling, SOB or lightheadedness with hypotension: Yes Has patient had a PCN reaction causing severe rash involving mucus membranes or skin necrosis: No Has patient had a PCN reaction that required hospitalization No Has patient had a PCN reaction occurring within the last 10 years: Yes If all of the above answers are "NO", then may proceed with Cephalosporin use.     Thank you for allowing pharmacy to be a part of this patients care.  Antibiotics this admission: Ceftriaxone 1/23>>1/23 Vancomycin 1/23>>   Micro data this admission: Bcx 1/23: MRSA   Sherlon Handing, PharmD, BCPS Please see amion for complete clinical pharmacist phone list 11/27/2021 9:23 AM

## 2021-11-27 NOTE — Progress Notes (Signed)
Patient provided with urine specimen cup, awaiting specimen collection.

## 2021-11-27 NOTE — Progress Notes (Signed)
Progress Note   Patient: Sherry Baker HGD:[id number] DOB: 06/14/1966 DOA: 11/25/2021     2 DOS: the patient was seen and examined on 11/27/2021   Brief hospital course: 56 y.o. female with medical history significant for polysubstance abuse, including cocaine use as well as alcohol abuse, type I bipolar disorder, central hypertension, generalized anxiety disorder, stage IIIb chronic kidney disease with associated baseline creatinine 1.3-1.6, chronic anemia with baseline hemoglobin 10-12, who was admitted to [facility] on 11/18/2021 with acute metabolic encephalopathy after presenting from home to [facility] ED for evaluation of altered mental status.  Recently hospitalized from December 22 to January 4 for altered mental status.  At that time also she was on Precedex.  She went into respiratory failure had to be intubated.  Supposed to discharge to skilled nursing facility which the patient eventually declined and went home.  Presented back on 1/16 with altered mental status.  Again placed on Precedex infusion in the ICU.  Was found to have maculopapular rash which became pustular.  Etiology was unclear.  Left AMA from the hospital on 11/23/2021.  Patient return to the ER due to swelling of her hand.  There was also concern that the rash on her back could be from monkey [place].  Patient was hospitalized for further management.  Subsequently noted to have MRSA bacteremia.  Infectious disease was consulted.  Assessment and Plan * Cellulitis of left hand- (present on admission) Plain films did not show any air in the soft tissue but soft tissue swelling was noted.  No obvious abscess noted on examination.  -IV abx -elevate extremity - Seen by orthopedics.  Plan is to continue with the antibiotic treatment.  Pain control.   MRSA bacteremia- (present on admission) -Likely from IVDU.  -nfectious disease following.   -Echocardiogram does not show any obvious vegetations.   -TEE planned for  Friday  Polysubstance abuse ([place])- (present on admission) - known history of IV drug use and polysubstance abuse.   -Urine drug screen positive for benzodiazepine and cocaine  CKD (chronic kidney disease), stage III ([place])- (present on admission) Chronic kidney disease stage IIIb Hyponatremia Renal function noted to be close to baseline.  Monitor urine output.  Avoid nephrotoxic agents.     Hyponatremia -appears to be trending down -check urine Na and urine osmo -daily labs  Anemia- (present on admission) Chronic for the most part.  Slight drop in hemoglobin is likely dilutional.   -No overt bleeding noted.  Continue to monitor.    Colostomy in place [facility]) Seen by ostomy nurse.  GERD (gastroesophageal reflux disease) Chronic.  Hypertension- (present on admission) Resume amlodipine and carvedilol.  Right hip pain- (present on admission) Proceed with imaging studies.   Pustular rash- (present on admission) Initially concern was for bedbugs.  But this is a significantly pustular rash.  Differential diagnosis does include monkeypox.  Seen by ID and they feel that this is most likely secondary to MRSA.  Continue contact precautions.  Lesions appear to be improving.   Severe malnutrition ([place])- (present on admission) Encourage oral intake.   Subjective: C/o right leg pain-- pins and needles   Objective Vitals:   11/27/21 0423 11/27/21 0735  BP: 117/75 (!) 139/91  Pulse: 94 91  Resp: 18 16  Temp: 98.8 F (37.1 C) 98.7 F (37.1 C)  SpO2: 96% 94%     General: Appearance:    Thin female in no acute distress     Lungs:     Clear  to auscultation bilaterally, respirations unlabored  Heart:    Normal heart rate.    MS:   All extremities are intact.    Neurologic:   Awake, alert       Data Reviewed: Na trending down Cr improved Repeat blood cultures pending  Disposition: Status is: Inpatient  Remains inpatient appropriate because: MRSA bacteremia- needs TEE  and IV abx    Time spent: 35 minutes  Author: Geradine Girt, DO 11/27/2021 12:09 PM  For on call review [email or web address].

## 2021-11-28 DIAGNOSIS — L03114 Cellulitis of left upper limb: Secondary | ICD-10-CM | POA: Diagnosis not present

## 2021-11-28 LAB — VARICELLA-ZOSTER BY PCR: Varicella-Zoster, PCR: NEGATIVE

## 2021-11-28 LAB — CBC
HCT: 23.8 % — ABNORMAL LOW (ref 36.0–46.0)
Hemoglobin: 7.9 g/dL — ABNORMAL LOW (ref 12.0–15.0)
MCH: 28.8 pg (ref 26.0–34.0)
MCHC: 33.2 g/dL (ref 30.0–36.0)
MCV: 86.9 fL (ref 80.0–100.0)
Platelets: 180 10*3/uL (ref 150–400)
RBC: 2.74 MIL/uL — ABNORMAL LOW (ref 3.87–5.11)
RDW: 16.8 % — ABNORMAL HIGH (ref 11.5–15.5)
WBC: 5.4 10*3/uL (ref 4.0–10.5)
nRBC: 0.6 % — ABNORMAL HIGH (ref 0.0–0.2)

## 2021-11-28 LAB — BASIC METABOLIC PANEL
Anion gap: 7 (ref 5–15)
BUN: 14 mg/dL (ref 6–20)
CO2: 21 mmol/L — ABNORMAL LOW (ref 22–32)
Calcium: 8.8 mg/dL — ABNORMAL LOW (ref 8.9–10.3)
Chloride: 101 mmol/L (ref 98–111)
Creatinine, Ser: 1.36 mg/dL — ABNORMAL HIGH (ref 0.44–1.00)
GFR, Estimated: 46 mL/min — ABNORMAL LOW (ref >60)
Glucose, Bld: 102 mg/dL — ABNORMAL HIGH (ref 70–99)
Potassium: 3.8 mmol/L (ref 3.5–5.1)
Sodium: 129 mmol/L — ABNORMAL LOW (ref 135–145)

## 2021-11-28 LAB — OSMOLALITY: Osmolality: 268 mOsm/kg — ABNORMAL LOW (ref 275–295)

## 2021-11-28 NOTE — H&P (View-Only) (Signed)
[place] for Infectious Disease    Date of Admission:  11/25/2021   Total days of antibiotics 4           ID: Sherry Baker is a 56 y.o. female with disseminated mrsa infection Principal Problem:   Cellulitis of left hand Active Problems:   Severe malnutrition (HCC)   Anemia   Hypertension   Polysubstance abuse (HCC)   Pustular rash   Hyponatremia   CKD (chronic kidney disease), stage III (HCC)   Colostomy in place [facility])   MRSA bacteremia   Right hip pain    Subjective: Afebrile, hand still hurting. Denies nausea and vomiting/nor diarrhea. Tolerating iv abtx  Medications:   amLODipine  5 mg Oral Daily   busPIRone  15 mg Oral BID   carvedilol  3.125 mg Oral BID WC   Chlorhexidine Gluconate Cloth  6 each Topical Q0600   citalopram  10 mg Oral Daily   enoxaparin (LOVENOX) injection  30 mg Subcutaneous H85I   folic acid  1 mg Oral Daily   gabapentin  200 mg Oral TID   mupirocin ointment   Nasal BID   pantoprazole  40 mg Oral BID   senna-docusate  2 tablet Oral QHS   thiamine  100 mg Oral Daily    Objective: Vital signs in last 24 hours: Temp:  [97.9 F (36.6 C)-99.9 F (37.7 C)] 97.9 F (36.6 C) (01/26 0815) Pulse Rate:  [80-90] 90 (01/26 0815) Resp:  [16-18] 16 (01/26 0815) BP: (121-129)/(61-73) 128/73 (01/26 0815) SpO2:  [94 %-99 %] 98 % (01/26 0815) Weight:  [46.5 kg] 46.5 kg (01/26 0500) Physical Exam  Constitutional:  oriented to person, place, and time. appears well-developed and well-nourished. No distress.  HENT: [place]/AT, PERRLA, no scleral icterus Mouth/Throat: Oropharynx is clear and moist. No oropharyngeal exudate.  Cardiovascular: Normal rate, regular rhythm and normal heart sounds. Exam reveals no gallop and no friction rub.  No murmur heard.  Pulmonary/Chest: Effort normal and breath sounds normal. No respiratory distress.  has no wheezes.  Neck = supple, no nuchal rigidity Ext = left hand pustules rupture. Erythema improving Back =  purulence dried up still having nodules Abdominal: Soft. Bowel sounds are normal.  exhibits no distension. There is no tenderness.  Lymphadenopathy: no cervical adenopathy. No axillary adenopathy Neurological: alert and oriented to person, place, and time.  Skin: Skin is warm and dry. No rash noted. No erythema.  Psychiatric: a normal mood and affect.  behavior is normal.    Lab Results Recent Labs    11/27/21 0133 11/28/21 0105  WBC 7.2 5.4  HGB 8.1* 7.9*  HCT 25.6* 23.8*  NA 125* 129*  K 4.0 3.8  CL 98 101  CO2 17* 21*  BUN 19 14  CREATININE 1.45* 1.36*   Liver Panel Recent Labs    11/25/21 2142  PROT 6.9  ALBUMIN 2.4*  AST 16  ALT 13  ALKPHOS 60  BILITOT 0.4    Microbiology: Blood cx  1/25 -pending 11/25/21 blood cx MRSA Studies/Results: No results found.   Assessment/Plan: Disseminated MRSA infection = continue with vancomycin for now. Plan for TEE tomorrow to determine length of course of abtx  Skin lesions improving Hand cellulitis appears improving as well. Continue with local wound care  Opiate use disorder = defer to primary team for management Sacral ulcer = continue with local wound care. VZV negative.  Aki = continues to improve. Will make sure to renally dose abtx  [name]  [facility] for Infectious Diseases Pager: [phone]  11/28/2021, 4:33 PM

## 2021-11-28 NOTE — Progress Notes (Signed)
Consent obtained for TEE on 11/29/21. Patient states understanding and agreeable to procedure.

## 2021-11-28 NOTE — Progress Notes (Signed)
Hand Surgery Update:  Left hand erythema much improved, pustules have ruptured and no underlying fluctuance, joint involvement, tendon sheath involvement at this time.   Continue IV abx Recommend soaks with warm soapy water or 50:50 mix saline and hibiclens if available  Soaks BID Clean dressing after soaks BID Continue finger ROM Elevate extremity No surgical intervention at this time  J. Sable Feil, MD Orthopaedic Hand Surgeon

## 2021-11-28 NOTE — Progress Notes (Signed)
Progress Note   Patient: Sherry Baker FKC:[id number] DOB: 1966-02-19 DOA: 11/25/2021     3 DOS: the patient was seen and examined on 11/28/2021   Brief hospital course: 56 y.o. female with medical history significant for polysubstance abuse, including cocaine use as well as alcohol abuse, type I bipolar disorder, central hypertension, generalized anxiety disorder, stage IIIb chronic kidney disease with associated baseline creatinine 1.3-1.6, chronic anemia with baseline hemoglobin 10-12, who was admitted to [facility] on 11/18/2021 with acute metabolic encephalopathy after presenting from home to [facility] ED for evaluation of altered mental status.  Recently hospitalized from December 22 to January 4 for altered mental status.  At that time also she was on Precedex.  She went into respiratory failure had to be intubated.  Supposed to discharge to skilled nursing facility which the patient eventually declined and went home.  Presented back on 1/16 with altered mental status.  Again placed on Precedex infusion in the ICU.  Was found to have maculopapular rash which became pustular.  Etiology was unclear.  Left AMA from the hospital on 11/23/2021.  Patient return to the ER due to swelling of her hand.  There was also concern that the rash on her back could be from monkey [place].  Patient was hospitalized for further management.  Subsequently noted to have MRSA bacteremia.  Infectious disease was consulted.  Assessment and Plan * Cellulitis of left hand- (present on admission) Plain films did not show any air in the soft tissue but soft tissue swelling was noted.  No obvious abscess noted on examination.  -IV abx -elevate extremity - Seen by orthopedics.  Plan is to continue with the antibiotic treatment.  Pain control. -warm soaks with Hibiclens   MRSA bacteremia- (present on admission) -Likely from IVDU.  -infectious disease following.   -Echocardiogram does not show any obvious vegetations.   -TEE  planned for Friday -repeat BC NGTD  Polysubstance abuse ([place])- (present on admission) -known history of IV drug use and polysubstance abuse.   -Urine drug screen positive for benzodiazepine and cocaine -continue to encourage cessation   CKD (chronic kidney disease), stage III ([place])- (present on admission) Chronic kidney disease stage IIIb Hyponatremia Renal function noted to be close to baseline.  Monitor urine output.  Avoid nephrotoxic agents.     Hyponatremia -stable low -continue to monitor  Anemia- (present on admission) Chronic for the most part.  Slight drop in hemoglobin is likely dilutional.   -No overt bleeding noted.  Continue to monitor.    Colostomy in place [facility]) Seen by ostomy nurse.  GERD (gastroesophageal reflux disease) Chronic.  Hypertension- (present on admission) Resume amlodipine and carvedilol.  Right hip pain- (present on admission) Proceed with imaging studies.   Pustular rash- (present on admission) Initially concern was for bedbugs.  But this is a significantly pustular rash.  Differential diagnosis does include monkeypox.  Seen by ID and they feel that this is most likely secondary to MRSA.  Continue contact precautions.  Lesions appear to be improving.   Severe malnutrition ([place])- (present on admission) Encourage oral intake.    Subjective: sleeping but when woken up complained of pain  Objective Vitals:   11/28/21 0046 11/28/21 0353 11/28/21 0500 11/28/21 0815  BP:  121/61  128/73  Pulse:  80  90  Resp:  18  16  Temp: 98.8 F (37.1 C) 99.9 F (37.7 C)  97.9 F (36.6 C)  TempSrc:  Oral  Oral  SpO2:  99%  98%  Weight:   46.5 kg   Height:        General: Appearance:    Thin female in no acute distress     Lungs:     respirations unlabored  Heart:    Normal heart rate.   MS:   All extremities are intact.    Neurologic:   Awake, alert     Data Reviewed: Na up to 129, Cr trending down, Hgd down  slightly   Disposition: Status is: Inpatient  Remains inpatient appropriate because: needs TEE and treatment of MRSA bacteremia     Time spent: 35 minutes  Author: Geradine Girt, DO 11/28/2021 11:00 AM  For on call review [email or web address].

## 2021-11-28 NOTE — Progress Notes (Signed)
° ° °  [facility] has been requested to perform a transesophageal echocardiogram on this patient for bacteremia.  After careful review of history and examination, the risks and benefits of transesophageal echocardiogram have been explained including risks of esophageal damage, perforation (1:10,000 risk), bleeding, pharyngeal hematoma as well as other potential complications associated with anesthesia including aspiration, arrhythmia, respiratory failure and death. Alternatives to treatment were discussed, questions were answered. Patient is willing to proceed. I did confirm with reader B, Dr. Stanford Breed, today that she may proceed with hx of ostomy (due to hx rectal CA). This is scheduled for tomorrow at noon with Dr. Harriet Masson.  Charlie Pitter, PA-C 11/28/2021 4:18 PM

## 2021-11-28 NOTE — Progress Notes (Signed)
Notified by secretary patient was requesting pain medication. When this RN entered room, found patient sleeping. This RN exited the room.

## 2021-11-28 NOTE — Progress Notes (Addendum)
[facility] for Infectious Disease    Date of Admission:  11/25/2021   Total days of antibiotics 4           ID: Sherry Baker is a 56 y.o. female with disseminated mrsa infection Principal Problem:   Cellulitis of left hand Active Problems:   Severe malnutrition (HCC)   Anemia   Hypertension   Polysubstance abuse (HCC)   Pustular rash   Hyponatremia   CKD (chronic kidney disease), stage III (HCC)   Colostomy in place [facility])   MRSA bacteremia   Right hip pain    Subjective: Afebrile, hand still hurting. Denies nausea and vomiting/nor diarrhea. Tolerating iv abtx  Medications:   amLODipine  5 mg Oral Daily   busPIRone  15 mg Oral BID   carvedilol  3.125 mg Oral BID WC   Chlorhexidine Gluconate Cloth  6 each Topical Q0600   citalopram  10 mg Oral Daily   enoxaparin (LOVENOX) injection  30 mg Subcutaneous P95K   folic acid  1 mg Oral Daily   gabapentin  200 mg Oral TID   mupirocin ointment   Nasal BID   pantoprazole  40 mg Oral BID   senna-docusate  2 tablet Oral QHS   thiamine  100 mg Oral Daily    Objective: Vital signs in last 24 hours: Temp:  [97.9 F (36.6 C)-99.9 F (37.7 C)] 97.9 F (36.6 C) (01/26 0815) Pulse Rate:  [80-90] 90 (01/26 0815) Resp:  [16-18] 16 (01/26 0815) BP: (121-129)/(61-73) 128/73 (01/26 0815) SpO2:  [94 %-99 %] 98 % (01/26 0815) Weight:  [46.5 kg] 46.5 kg (01/26 0500) Physical Exam  Constitutional:  oriented to person, place, and time. appears well-developed and well-nourished. No distress.  HENT: [place]/AT, PERRLA, no scleral icterus Mouth/Throat: Oropharynx is clear and moist. No oropharyngeal exudate.  Cardiovascular: Normal rate, regular rhythm and normal heart sounds. Exam reveals no gallop and no friction rub.  No murmur heard.  Pulmonary/Chest: Effort normal and breath sounds normal. No respiratory distress.  has no wheezes.  Neck = supple, no nuchal rigidity Ext = left hand pustules rupture. Erythema improving Back =  purulence dried up still having nodules Abdominal: Soft. Bowel sounds are normal.  exhibits no distension. There is no tenderness.  Lymphadenopathy: no cervical adenopathy. No axillary adenopathy Neurological: alert and oriented to person, place, and time.  Skin: Skin is warm and dry. No rash noted. No erythema.  Psychiatric: a normal mood and affect.  behavior is normal.    Lab Results Recent Labs    11/27/21 0133 11/28/21 0105  WBC 7.2 5.4  HGB 8.1* 7.9*  HCT 25.6* 23.8*  NA 125* 129*  K 4.0 3.8  CL 98 101  CO2 17* 21*  BUN 19 14  CREATININE 1.45* 1.36*   Liver Panel Recent Labs    11/25/21 2142  PROT 6.9  ALBUMIN 2.4*  AST 16  ALT 13  ALKPHOS 2  BILITOT 0.4    Microbiology: Blood cx  1/25 -pending 11/25/21 blood cx MRSA Studies/Results: No results found.   Assessment/Plan: Disseminated MRSA infection = continue with vancomycin for now. Plan for TEE tomorrow to determine length of course of abtx  Skin lesions improving Hand cellulitis appears improving as well. Continue with local wound care  Opiate use disorder = defer to primary team for management Sacral ulcer = continue with local wound care. VZV negative.  Aki = continues to improve. Will make sure to renally dose abtx  [name]  [facility] for Infectious Diseases Pager: [phone]  11/28/2021, 4:33 PM

## 2021-11-29 ENCOUNTER — Inpatient Hospital Stay (HOSPITAL_COMMUNITY): Payer: Medicaid Other

## 2021-11-29 ENCOUNTER — Inpatient Hospital Stay (HOSPITAL_COMMUNITY): Payer: Medicaid Other | Admitting: Anesthesiology

## 2021-11-29 ENCOUNTER — Encounter (HOSPITAL_COMMUNITY): Payer: Self-pay | Admitting: Internal Medicine

## 2021-11-29 ENCOUNTER — Encounter (HOSPITAL_COMMUNITY)
Admission: EM | Disposition: A | Discharge status: Home / Self Care | Payer: Self-pay | Source: Home / Self Care | Attending: Family Medicine

## 2021-11-29 DIAGNOSIS — R7881 Bacteremia: Secondary | ICD-10-CM | POA: Diagnosis not present

## 2021-11-29 DIAGNOSIS — L89159 Pressure ulcer of sacral region, unspecified stage: Secondary | ICD-10-CM | POA: Diagnosis not present

## 2021-11-29 DIAGNOSIS — M169 Osteoarthritis of hip, unspecified: Secondary | ICD-10-CM | POA: Diagnosis not present

## 2021-11-29 DIAGNOSIS — L03114 Cellulitis of left upper limb: Secondary | ICD-10-CM | POA: Diagnosis not present

## 2021-11-29 DIAGNOSIS — I340 Nonrheumatic mitral (valve) insufficiency: Secondary | ICD-10-CM

## 2021-11-29 HISTORY — PX: BUBBLE STUDY: SHX6837

## 2021-11-29 HISTORY — PX: TEE WITHOUT CARDIOVERSION: SHX5443

## 2021-11-29 LAB — CBC
HCT: 22.1 % — ABNORMAL LOW (ref 36.0–46.0)
Hemoglobin: 7.3 g/dL — ABNORMAL LOW (ref 12.0–15.0)
MCH: 28.2 pg (ref 26.0–34.0)
MCHC: 33 g/dL (ref 30.0–36.0)
MCV: 85.3 fL (ref 80.0–100.0)
Platelets: 199 10*3/uL (ref 150–400)
RBC: 2.59 MIL/uL — ABNORMAL LOW (ref 3.87–5.11)
RDW: 16.5 % — ABNORMAL HIGH (ref 11.5–15.5)
WBC: 4.6 10*3/uL (ref 4.0–10.5)
nRBC: 0.4 % — ABNORMAL HIGH (ref 0.0–0.2)

## 2021-11-29 LAB — BASIC METABOLIC PANEL
Anion gap: 9 (ref 5–15)
BUN: 12 mg/dL (ref 6–20)
CO2: 20 mmol/L — ABNORMAL LOW (ref 22–32)
Calcium: 8.5 mg/dL — ABNORMAL LOW (ref 8.9–10.3)
Chloride: 98 mmol/L (ref 98–111)
Creatinine, Ser: 1.38 mg/dL — ABNORMAL HIGH (ref 0.44–1.00)
GFR, Estimated: 45 mL/min — ABNORMAL LOW (ref >60)
Glucose, Bld: 101 mg/dL — ABNORMAL HIGH (ref 70–99)
Potassium: 3.4 mmol/L — ABNORMAL LOW (ref 3.5–5.1)
Sodium: 127 mmol/L — ABNORMAL LOW (ref 135–145)

## 2021-11-29 SURGERY — ECHOCARDIOGRAM, TRANSESOPHAGEAL
Anesthesia: Monitor Anesthesia Care

## 2021-11-29 MED ORDER — DICLOFENAC SODIUM 1 % EX GEL
2.0000 g | Freq: Four times a day (QID) | CUTANEOUS | Status: DC
  Administered 2021-11-29 – 2021-12-15 (×61): 2 g via TOPICAL
  Filled 2021-11-29 (×4): qty 100

## 2021-11-29 MED ORDER — PROPOFOL 500 MG/50ML IV EMUL
INTRAVENOUS | Status: DC | PRN
  Administered 2021-11-29: 150 ug/kg/min via INTRAVENOUS

## 2021-11-29 MED ORDER — SODIUM CHLORIDE 0.9 % IV SOLN: INTRAVENOUS | Status: DC

## 2021-11-29 MED ORDER — LIDOCAINE VISCOUS HCL 2 % MT SOLN
OROMUCOSAL | Status: DC | PRN
  Administered 2021-11-29: 1 via OROMUCOSAL

## 2021-11-29 MED ORDER — VANCOMYCIN HCL 500 MG/100ML IV SOLN
500.0000 mg | INTRAVENOUS | Status: DC
  Administered 2021-11-29 – 2021-12-02 (×3): 500 mg via INTRAVENOUS
  Filled 2021-11-29 (×3): qty 100

## 2021-11-29 MED ORDER — PHENYLEPHRINE 40 MCG/ML (10ML) SYRINGE FOR IV PUSH (FOR BLOOD PRESSURE SUPPORT)
PREFILLED_SYRINGE | INTRAVENOUS | Status: DC | PRN
  Administered 2021-11-29: 80 ug via INTRAVENOUS
  Administered 2021-11-29: 120 ug via INTRAVENOUS
  Administered 2021-11-29: 40 ug via INTRAVENOUS

## 2021-11-29 MED ORDER — LIDOCAINE VISCOUS HCL 2 % MT SOLN
OROMUCOSAL | Status: AC
  Filled 2021-11-29: qty 15

## 2021-11-29 MED ORDER — VANCOMYCIN HCL 1250 MG/250ML IV SOLN: 1250.0000 mg | INTRAVENOUS | Status: DC

## 2021-11-29 NOTE — Progress Notes (Signed)
Progress Note   Patient: Sherry Baker MPN:[id number] DOB: February 06, 1966 DOA: 11/25/2021     4 DOS: the patient was seen and examined on 11/29/2021   Brief hospital course: 56 y.o. female with medical history significant for polysubstance abuse, including cocaine use as well as alcohol abuse, type I bipolar disorder, central hypertension, generalized anxiety disorder, stage IIIb chronic kidney disease with associated baseline creatinine 1.3-1.6, chronic anemia with baseline hemoglobin 10-12, who was admitted to [facility] on 11/18/2021 with acute metabolic encephalopathy after presenting from home to [facility] ED for evaluation of altered mental status.  Recently hospitalized from December 22 to January 4 for altered mental status.  At that time also she was on Precedex.  She went into respiratory failure had to be intubated.  Supposed to discharge to skilled nursing facility which the patient eventually declined and went home.  Presented back on 1/16 with altered mental status.  Again placed on Precedex infusion in the ICU.  Was found to have maculopapular rash which became pustular.  Etiology was unclear.  Left AMA from the hospital on 11/23/2021.  Patient return to the ER due to swelling of her hand.  There was also concern that the rash on her back could be from monkey [place].  Patient was hospitalized for further management.  Subsequently noted to have MRSA bacteremia.  Infectious disease was consulted.  Assessment and Plan * Cellulitis of left hand- (present on admission) Plain films did not show any air in the soft tissue but soft tissue swelling was noted.  No obvious abscess noted on examination.  -IV abx -elevate extremity - Seen by orthopedics.  Plan is to continue with the antibiotic treatment.  Pain control. -warm soaks with Hibiclens   Right hip pain- (present on admission) -x ray negative -check MRI for septic joint/abscess    MRSA bacteremia- (present on admission) -Likely from IVDU.   -infectious disease following.   -Echocardiogram does not show any obvious vegetations.   -TEE negative -repeat BC NGTD  Polysubstance abuse ([place])- (present on admission) -known history of IV drug use and polysubstance abuse.   -Urine drug screen positive for benzodiazepine and cocaine -continue to encourage cessation   CKD (chronic kidney disease), stage III ([place])- (present on admission) Chronic kidney disease stage IIIb Hyponatremia Renal function noted to be close to baseline.  Monitor urine output.  Avoid nephrotoxic agents.     Hyponatremia -stable low -continue to monitor  Anemia- (present on admission) Chronic for the most part.  Slight drop in hemoglobin is likely dilutional.   -No overt bleeding noted.  Continue to monitor.    Colostomy in place [facility]) Seen by ostomy nurse.  Pustular rash- (present on admission) Initially concern was for bedbugs.  But this is a significantly pustular rash.  Differential diagnosis does include monkeypox.  Seen by ID and they feel that this is most likely secondary to MRSA.  Continue contact precautions.  Lesions appear to be improving.   GERD (gastroesophageal reflux disease) Chronic.  Hypertension- (present on admission) Resume amlodipine and carvedilol.  Severe malnutrition ([place])- (present on admission) Encourage oral intake.    Subjective: fever last PM, still with right hip pain  Objective Vitals:   11/29/21 1252 11/29/21 1308 11/29/21 1322 11/29/21 1340  BP: (!) 97/51 (!) 102/57 100/64 112/66  Pulse: 81 89 81 70  Resp: 15 19 15 16   Temp: 99.4 F (37.4 C)  99.4 F (37.4 C) (!) 100.8 F (38.2 C)  TempSrc:  Oral  SpO2: 95% 96% 92% 94%  Weight:      Height:        General: Appearance:    Thin female in no acute distress     Lungs:      respirations unlabored  Heart:    Normal heart rate.    MS:   All extremities are intact.  Pain in right hip with movement   Neurologic:   Awake, alert     Data  Reviewed: K and Na slightly low, Hgb trending down    Disposition: Status is: Inpatient  Remains inpatient appropriate because: needs IV abx and further treatment for MRSA bacteremia     Time spent: 35 minutes  Author: Geradine Girt, DO 11/29/2021 1:47 PM  For on call review [email or web address].

## 2021-11-29 NOTE — Interval H&P Note (Signed)
History and Physical Interval Note:  11/29/2021 11:44 AM  Sherry Baker  has presented today for surgery, with the diagnosis of bactriremia poly abuse.  The various methods of treatment have been discussed with the patient and family. After consideration of risks, benefits and other options for treatment, the patient has consented to  Procedure(s): TRANSESOPHAGEAL ECHOCARDIOGRAM (TEE) (N/A) as a surgical intervention.  The patient's history has been reviewed, patient examined, no change in status, stable for surgery.  I have reviewed the patient's chart and labs.  Questions were answered to the patient's satisfaction.     [name]

## 2021-11-29 NOTE — Progress Notes (Addendum)
Pharmacy Antibiotic Note  Sherry Baker is a 56 y.o. female admitted on 11/25/2021 with bacteremia.  Pharmacy has been consulted for Vanco dosing.  ID: Cellulitis w/ MRSA bacteremia - ID following  - Rash is not monkeypox per ID - TTE - no vegetations.  - varicella zoster swab neg   1/23 got 1g Vanco 1/24, none on 1/24,  1/25 at 0100: got 750mg  , 1/26 at 0045: got  500mg   1/27 at 0017 got 500mg   Ceftriaxone 1/23>>1/23 Vancomycin 1/23>>  Bcx 1/23: MRSA 1/25 bcx: ngtd  1/23: VZV: neg  Plan: Vancomycin 500mg  IV q24h for AUC 462 with Scr 1.38 (TBW is < her IBW) Levels at steady state around Sunday or Monday night.   Height: 5\' 2"  (157.5 cm) Weight: 46.1 kg (101 lb 11.2 oz) IBW/kg (Calculated) : 50.1  Temp (24hrs), Avg:100.7 F (38.2 C), Min:99.9 F (37.7 C), Max:101.5 F (38.6 C)  Recent Labs  Lab 11/25/21 0158 11/25/21 0350 11/25/21 0450 11/25/21 2142 11/26/21 0133 11/27/21 0133 11/28/21 0105 11/29/21 0247  WBC 12.0*  --  10.3  --  8.6 7.2 5.4 4.6  CREATININE 1.72*  --   --  1.63* 1.69* 1.45* 1.36* 1.38*  LATICACIDVEN 1.8 0.9  --   --   --   --   --   --     Estimated Creatinine Clearance: 33.9 mL/min (A) (by C-G formula based on SCr of 1.38 mg/dL (H)).    Allergies  Allergen Reactions   Penicillins Hives and Swelling    Has patient had a PCN reaction causing immediate rash, facial/tongue/throat swelling, SOB or lightheadedness with hypotension: Yes Has patient had a PCN reaction causing severe rash involving mucus membranes or skin necrosis: No Has patient had a PCN reaction that required hospitalization No Has patient had a PCN reaction occurring within the last 10 years: Yes If all of the above answers are "NO", then may proceed with Cephalosporin use.     Sherry Baker S. Alford Highland, PharmD, BCPS Clinical Staff Pharmacist [email or web address]   Sherry Baker 11/29/2021 9:27 AM

## 2021-11-29 NOTE — Anesthesia Postprocedure Evaluation (Signed)
Anesthesia Post Note  Patient: Sherry Baker  Procedure(s) Performed: TRANSESOPHAGEAL ECHOCARDIOGRAM (TEE) BUBBLE STUDY     Patient location during evaluation: Endoscopy Anesthesia Type: MAC Level of consciousness: awake and alert Pain management: pain level controlled Vital Signs Assessment: post-procedure vital signs reviewed and stable Respiratory status: spontaneous breathing, nonlabored ventilation and respiratory function stable Cardiovascular status: stable and blood pressure returned to baseline Postop Assessment: no apparent nausea or vomiting Anesthetic complications: no   No notable events documented.  Last Vitals:  Vitals:   11/29/21 1308 11/29/21 1322  BP: (!) 102/57 100/64  Pulse: 89 81  Resp: 19 15  Temp:  37.4 C  SpO2: 96% 92%    Last Pain:  Vitals:   11/29/21 1322  TempSrc:   PainSc: Asleep                 Natividad Halls,W. EDMOND

## 2021-11-29 NOTE — CV Procedure (Signed)
° ° °  TRANSESOPHAGEAL ECHOCARDIOGRAM   NAME:  Sherry Baker    MRN: [id number] DOB:  11/11/1965    ADMIT DATE: 11/25/2021  INDICATIONS: Bacteriemia  PROCEDURE:   Informed consent was obtained prior to the procedure. The risks, benefits and alternatives for the procedure were discussed and the patient comprehended these risks.  Risks include, but are not limited to, cough, sore throat, vomiting, nausea, somnolence, esophageal and stomach trauma or perforation, bleeding, low blood pressure, aspiration, pneumonia, infection, trauma to the teeth and death.    Procedural time out performed. The oropharynx was anesthetized with viscous lidocaine.  Anesthesia was administered by the anaesthesilogy team.  The patient was administered by the anesthesia team to achieve and maintain moderate to deep conscious sedation.  The patient's heart rate, blood pressure, and oxygen saturation were monitored continuously during the procedure.  The transesophageal probe was inserted in the esophagus and stomach without difficulty and multiple views were obtained.   The patient tolerated the procedure well.  COMPLICATIONS:    There were no immediate complications.  KEY FINDINGS:  Normal ejection fraction, No appreciable vegetation on preliminary review.  Full report to follow. Further management per primary team.   Berniece Salines, DO [place]  12:47 PM

## 2021-11-29 NOTE — Progress Notes (Signed)
°    [place] for Infectious Disease    Date of Admission:  11/25/2021   Total days of antibiotics 6          ID: Sherry Baker is a 56 y.o. female with disseminated MRSA infection Principal Problem:   Cellulitis of left hand Active Problems:   Severe malnutrition (HCC)   Anemia   Hypertension   Polysubstance abuse (HCC)   Pustular rash   Hyponatremia   CKD (chronic kidney disease), stage III (HCC)   Colostomy in place [facility])   MRSA bacteremia   Right hip pain    Subjective: Had sustained fevers up to 101F yesterday. Complaining of more right hip pain. She underwent TEE that excluded  vegetations  Medications:   amLODipine  5 mg Oral Daily   busPIRone  15 mg Oral BID   carvedilol  3.125 mg Oral BID WC   Chlorhexidine Gluconate Cloth  6 each Topical Q0600   citalopram  10 mg Oral Daily   diclofenac Sodium  2 g Topical QID   enoxaparin (LOVENOX) injection  30 mg Subcutaneous E70J   folic acid  1 mg Oral Daily   gabapentin  200 mg Oral TID   mupirocin ointment   Nasal BID   pantoprazole  40 mg Oral BID   senna-docusate  2 tablet Oral QHS   thiamine  100 mg Oral Daily    Objective: Vital signs in last 24 hours: Temp:  [98.7 F (37.1 C)-101.5 F (38.6 C)] 100.8 F (38.2 C) (01/27 1340) Pulse Rate:  [70-89] 70 (01/27 1340) Resp:  [15-20] 16 (01/27 1340) BP: (97-145)/(51-79) 112/66 (01/27 1340) SpO2:  [92 %-99 %] 94 % (01/27 1340) Weight:  [46.1 kg] 46.1 kg (01/27 0517) Physical Exam  Constitutional: He is oriented to person, place, and time. He appears well-developed and well-nourished. No distress.  HENT:  Mouth/Throat: Oropharynx is clear and moist. No oropharyngeal exudate.  Cardiovascular: Normal rate, regular rhythm and normal heart sounds. Exam reveals no gallop and no friction rub.  No murmur heard.  Pulmonary/Chest: Effort normal and breath sounds normal. No respiratory distress. He has no wheezes.  Abdominal: Soft. Bowel sounds are normal. He  exhibits no distension. There is no tenderness.  Neurological: He is alert and oriented to person, place, and time.  Skin: Skin is warm and dry. Lesions are improving, less erythema. Hand lesions continue to improve Psychiatric: He has a normal mood and affect. His behavior is normal.    Lab Results Recent Labs    11/28/21 0105 11/29/21 0247  WBC 5.4 4.6  HGB 7.9* 7.3*  HCT 23.8* 22.1*  NA 129* 127*  K 3.8 3.4*  CL 101 98  CO2 21* 20*  BUN 14 12  CREATININE 1.36* 1.38*    Microbiology: Blood cx 1/25- ngtd Blood cx 1/23- MRSA Studies/Results: No results found.   Assessment/Plan: Disseminated MRSA infection = has cleared her bacteremia however concern that she has septic arthritis. Continue with vancomycin. Recommend to check MRI of right hip. Endocarditis has been ruled out  Ckd and therapeutic drug monitoring = stable  Fever= if has ongoing fever, recommend to get repeat blood cx.  [facility] for Infectious Diseases Pager: [phone]  11/29/2021, 2:15 PM

## 2021-11-29 NOTE — Progress Notes (Addendum)
°  Echocardiogram Echocardiogram transesophageal has been performed.  Sherry Baker 11/29/2021, 1:05 PM

## 2021-11-29 NOTE — Anesthesia Preprocedure Evaluation (Signed)
Anesthesia Evaluation  Patient identified by MRN, date of birth, ID band Patient awake    Reviewed: Allergy & Precautions, H&P , NPO status , Patient's Chart, lab work & pertinent test results, reviewed documented beta blocker date and time   Airway Mallampati: II  TM Distance: >3 FB Neck ROM: Full    Dental no notable dental hx. (+) Poor Dentition, Dental Advisory Given   Pulmonary Current Smoker and Patient abstained from smoking.,    Pulmonary exam normal breath sounds clear to auscultation       Cardiovascular hypertension, Pt. on medications and Pt. on home beta blockers  Rhythm:Regular Rate:Normal     Neuro/Psych Depression Bipolar Disorder negative neurological ROS     GI/Hepatic GERD  Medicated,(+) Cirrhosis       ,   Endo/Other  negative endocrine ROS  Renal/GU Renal InsufficiencyRenal disease  negative genitourinary   Musculoskeletal  (+) Arthritis , Osteoarthritis,    Abdominal   Peds  Hematology  (+) Blood dyscrasia, anemia ,   Anesthesia Other Findings   Reproductive/Obstetrics negative OB ROS                             Anesthesia Physical Anesthesia Plan  ASA: 3  Anesthesia Plan: MAC   Post-op Pain Management: Minimal or no pain anticipated   Induction: Intravenous  PONV Risk Score and Plan: 1 and Propofol infusion  Airway Management Planned: Nasal Cannula and Natural Airway  Additional Equipment:   Intra-op Plan:   Post-operative Plan:   Informed Consent: I have reviewed the patients History and Physical, chart, labs and discussed the procedure including the risks, benefits and alternatives for the proposed anesthesia with the patient or authorized representative who has indicated his/her understanding and acceptance.     Dental advisory given  Plan Discussed with: CRNA  Anesthesia Plan Comments:         Anesthesia Quick Evaluation

## 2021-11-29 NOTE — Plan of Care (Signed)
°  Problem: Clinical Measurements: Goal: Ability to avoid or minimize complications of infection will improve Outcome: Progressing   Problem: Skin Integrity: Goal: Skin integrity will improve Outcome: Not Progressing   Problem: Education: Goal: Knowledge of General Education information will improve Description: Including pain rating scale, medication(s)/side effects and non-pharmacologic comfort measures Outcome: Progressing   Problem: Health Behavior/Discharge Planning: Goal: Ability to manage health-related needs will improve Outcome: Progressing   Problem: Clinical Measurements: Goal: Ability to maintain clinical measurements within normal limits will improve Outcome: Progressing Goal: Respiratory complications will improve Outcome: Progressing Goal: Cardiovascular complication will be avoided Outcome: Progressing   Problem: Activity: Goal: Risk for activity intolerance will decrease Outcome: Progressing   Problem: Nutrition: Goal: Adequate nutrition will be maintained Outcome: Progressing   Problem: Coping: Goal: Level of anxiety will decrease Outcome: Progressing   Problem: Elimination: Goal: Will not experience complications related to bowel motility Outcome: Progressing   Problem: Pain Managment: Goal: General experience of comfort will improve Outcome: Progressing   Problem: Safety: Goal: Ability to remain free from injury will improve Outcome: Progressing   Problem: Skin Integrity: Goal: Risk for impaired skin integrity will decrease Outcome: Not Progressing

## 2021-11-29 NOTE — Transfer of Care (Signed)
Immediate Anesthesia Transfer of Care Note  Patient: Sherry Baker  Procedure(s) Performed: TRANSESOPHAGEAL ECHOCARDIOGRAM (TEE) BUBBLE STUDY  Patient Location: PACU  Anesthesia Type:MAC  Level of Consciousness: awake, alert  and oriented  Airway & Oxygen Therapy: Patient Spontanous Breathing and Patient connected to nasal cannula oxygen  Post-op Assessment: Report given to RN  Post vital signs: Reviewed and stable  Last Vitals:  Vitals Value Taken Time  BP 97/51 11/29/21 1252  Temp    Pulse 81 11/29/21 1255  Resp 14 11/29/21 1255  SpO2 96 % 11/29/21 1255  Vitals shown include unvalidated device data.  Last Pain:  Vitals:   11/29/21 1110  TempSrc: Temporal  PainSc: Asleep      Patients Stated Pain Goal: 2 (44/31/54 0086)  Complications: No notable events documented.

## 2021-11-30 ENCOUNTER — Inpatient Hospital Stay (HOSPITAL_COMMUNITY): Payer: Medicaid Other

## 2021-11-30 ENCOUNTER — Encounter (HOSPITAL_COMMUNITY): Payer: Self-pay | Admitting: Cardiology

## 2021-11-30 DIAGNOSIS — L03114 Cellulitis of left upper limb: Secondary | ICD-10-CM | POA: Diagnosis not present

## 2021-11-30 LAB — CBC
HCT: 24 % — ABNORMAL LOW (ref 36.0–46.0)
Hemoglobin: 7.6 g/dL — ABNORMAL LOW (ref 12.0–15.0)
MCH: 27.7 pg (ref 26.0–34.0)
MCHC: 31.7 g/dL (ref 30.0–36.0)
MCV: 87.6 fL (ref 80.0–100.0)
Platelets: 200 10*3/uL (ref 150–400)
RBC: 2.74 MIL/uL — ABNORMAL LOW (ref 3.87–5.11)
RDW: 16.9 % — ABNORMAL HIGH (ref 11.5–15.5)
WBC: 4.4 10*3/uL (ref 4.0–10.5)
nRBC: 0 % (ref 0.0–0.2)

## 2021-11-30 LAB — BASIC METABOLIC PANEL
Anion gap: 9 (ref 5–15)
BUN: 16 mg/dL (ref 6–20)
CO2: 20 mmol/L — ABNORMAL LOW (ref 22–32)
Calcium: 8.6 mg/dL — ABNORMAL LOW (ref 8.9–10.3)
Chloride: 102 mmol/L (ref 98–111)
Creatinine, Ser: 1.43 mg/dL — ABNORMAL HIGH (ref 0.44–1.00)
GFR, Estimated: 44 mL/min — ABNORMAL LOW (ref >60)
Glucose, Bld: 95 mg/dL (ref 70–99)
Potassium: 3.8 mmol/L (ref 3.5–5.1)
Sodium: 131 mmol/L — ABNORMAL LOW (ref 135–145)

## 2021-11-30 NOTE — Progress Notes (Addendum)
Progress Note   Patient: Sherry Baker PPI:[id number] DOB: February 17, 1966 DOA: 11/25/2021     5 DOS: the patient was seen and examined on 11/30/2021   Brief hospital course: 56 y.o. female with medical history significant for polysubstance abuse, including cocaine use as well as alcohol abuse, type I bipolar disorder, central hypertension, generalized anxiety disorder, stage IIIb chronic kidney disease with associated baseline creatinine 1.3-1.6, chronic anemia with baseline hemoglobin 10-12, who was admitted to [facility] on 11/18/2021 with acute metabolic encephalopathy after presenting from home to [facility] ED for evaluation of altered mental status.  Recently hospitalized from December 22 to January 4 for altered mental status.  At that time also she was on Precedex.  She went into respiratory failure had to be intubated.  Supposed to discharge to skilled nursing facility which the patient eventually declined and went home.  Presented back on 1/16 with altered mental status.  Again placed on Precedex infusion in the ICU.  Was found to have maculopapular rash which became pustular.  Etiology was unclear.  Left AMA from the hospital on 11/23/2021.  Patient return to the ER due to swelling of her hand.  There was also concern that the rash on her back could be from monkey pox.  Patient was hospitalized for further management.  Subsequently noted to have MRSA bacteremia.  Infectious disease was consulted.  S/p TEE which was negative Now with hip pain and MRI pending.  Slow to improve with continued intermittent fevers.   Assessment and Plan * Cellulitis of left hand- (present on admission) Plain films did not show any air in the soft tissue but soft tissue swelling was noted.  No obvious abscess noted on examination.  -IV abx -elevate extremity - Seen by orthopedics.  Plan is to continue with the antibiotic treatment.  Pain control. -warm soaks with Hibiclens   Right hip pain- (present on  admission) -x ray negative -check MRI for septic joint/abscess -- ordered and pending   MRSA bacteremia- (present on admission) -Likely from IVDU.  -infectious disease following.   -Echocardiogram does not show any obvious vegetations.   -TEE negative -repeat BC NGTD -if she spikes another temperature, plan to repeat blood cultures  Polysubstance abuse ([place])- (present on admission) -known history of IV drug use and polysubstance abuse.   -Urine drug screen positive for benzodiazepine and cocaine -continue to encourage cessation   CKD (chronic kidney disease), stage III ([place])- (present on admission) Chronic kidney disease stage IIIb Hyponatremia Renal function noted to be close to baseline.  Monitor urine output.  Avoid nephrotoxic agents.     Hyponatremia -stable low -continue to monitor  Anemia- (present on admission) Chronic for the most part.  Slight drop in hemoglobin is likely dilutional.   -No overt bleeding noted.  Continue to monitor. -transfuse for <7    Colostomy in place [facility]) Seen by ostomy nurse.  Pustular rash- (present on admission) - pustular rash.  Differential diagnosis does include monkeypox.  Seen by ID and they feel that this is most likely secondary to MRSA.  Continue contact precautions.  Lesions appear to be improving.   GERD (gastroesophageal reflux disease) Chronic.  Hypertension- (present on admission) Resume amlodipine and carvedilol.  Severe malnutrition ([place])- (present on admission) Encourage oral intake.    Subjective: still with hip pain  Objective Vitals:   11/29/21 2025 11/30/21 0349 11/30/21 0404 11/30/21 0814  BP: 102/61 131/73  125/71  Pulse: 95 75  69  Resp: 16 18  18  Temp: 99.9 F (37.7 C) 98.9 F (37.2 C)  99.1 F (37.3 C)  TempSrc: Oral Oral  Oral  SpO2: 94% 100%  100%  Weight:   48.1 kg   Height:         General: Appearance:    Thin female in no acute distress     Lungs:      respirations unlabored   Heart:    Normal heart rate.    MS:   All extremities are intact.    Neurologic:   Awake, alert     Data Reviewed: Hgb stable, Na trending up, Cr up slightly    Disposition: Status is: Inpatient  Remains inpatient appropriate because: needs MRI hip and treatment of her bacteremia   Time spent: 35 minutes  Author: Geradine Girt, DO 11/30/2021 2:46 PM  For on call review [email or web address].

## 2021-11-30 NOTE — Plan of Care (Signed)
  Problem: Nutrition: Goal: Adequate nutrition will be maintained Outcome: Progressing   Problem: Pain Managment: Goal: General experience of comfort will improve Outcome: Progressing   Problem: Safety: Goal: Ability to remain free from injury will improve Outcome: Progressing   

## 2021-12-01 DIAGNOSIS — L03114 Cellulitis of left upper limb: Secondary | ICD-10-CM | POA: Diagnosis not present

## 2021-12-01 LAB — CBC
HCT: 24.4 % — ABNORMAL LOW (ref 36.0–46.0)
Hemoglobin: 7.8 g/dL — ABNORMAL LOW (ref 12.0–15.0)
MCH: 28 pg (ref 26.0–34.0)
MCHC: 32 g/dL (ref 30.0–36.0)
MCV: 87.5 fL (ref 80.0–100.0)
Platelets: 262 10*3/uL (ref 150–400)
RBC: 2.79 MIL/uL — ABNORMAL LOW (ref 3.87–5.11)
RDW: 16.7 % — ABNORMAL HIGH (ref 11.5–15.5)
WBC: 5.1 10*3/uL (ref 4.0–10.5)
nRBC: 0 % (ref 0.0–0.2)

## 2021-12-01 LAB — BASIC METABOLIC PANEL
Anion gap: 10 (ref 5–15)
BUN: 19 mg/dL (ref 6–20)
CO2: 20 mmol/L — ABNORMAL LOW (ref 22–32)
Calcium: 8.7 mg/dL — ABNORMAL LOW (ref 8.9–10.3)
Chloride: 102 mmol/L (ref 98–111)
Creatinine, Ser: 1.51 mg/dL — ABNORMAL HIGH (ref 0.44–1.00)
GFR, Estimated: 41 mL/min — ABNORMAL LOW (ref >60)
Glucose, Bld: 91 mg/dL (ref 70–99)
Potassium: 3.5 mmol/L (ref 3.5–5.1)
Sodium: 132 mmol/L — ABNORMAL LOW (ref 135–145)

## 2021-12-01 MED ORDER — PANTOPRAZOLE SODIUM 40 MG PO TBEC
40.0000 mg | DELAYED_RELEASE_TABLET | Freq: Every day | ORAL | Status: DC
  Administered 2021-12-02 – 2021-12-15 (×14): 40 mg via ORAL
  Filled 2021-12-01 (×14): qty 1

## 2021-12-01 NOTE — Progress Notes (Signed)
Progress Note   Patient: Sherry Baker [id number]:[id number] DOB: [birth year] DOA: 11/25/2021     6 DOS: the patient was seen and examined on 12/01/2021   Brief hospital course: 56 y.o. female with medical history significant for polysubstance abuse, including cocaine use as well as alcohol abuse, type I bipolar disorder, central hypertension, generalized anxiety disorder, stage IIIb chronic kidney disease with associated baseline creatinine 1.3-1.6, chronic anemia with baseline hemoglobin 10-12, who was admitted to [facility] on 11/18/2021 with acute metabolic encephalopathy after presenting from home to [facility] ED for evaluation of altered mental status.  Recently hospitalized from December 22 to January 4 for altered mental status.  At that time also she was on Precedex.  She went into respiratory failure had to be intubated.  Supposed to discharge to skilled nursing facility which the patient eventually declined and went home.  Presented back on 1/16 with altered mental status.  Again placed on Precedex infusion in the ICU.  Was found to have maculopapular rash which became pustular.  Etiology was unclear.  Left AMA from the hospital on 11/23/2021.  Patient return to the ER due to swelling of her hand.  There was also concern that the rash on her back could be from monkey pox.  Patient was hospitalized for further management.  Subsequently noted to have MRSA bacteremia.  Infectious disease was consulted.  S/p TEE which was negative Slow to improve with continued intermittent fevers.   Assessment and Plan * Cellulitis of left hand- (present on admission) Plain films did not show any air in the soft tissue but soft tissue swelling was noted.  No obvious abscess noted on examination. Improving w/ iv abx  -IV abx -elevate extremity - Seen by orthopedics.  Plan is to continue with the antibiotic treatment.  Pain control. -warm soaks with Hibiclens   Right hip pain- (present on admission) -x ray negative.  Mri suspicious for possible septic joint though known OA also ddx - cont iv abx for now - will ask ortho to assess  MRSA bacteremia- (present on admission) -Likely from IVDU.  -infectious disease following.   -Echocardiogram does not show any obvious vegetations.   -TEE negative -repeat BC NGTD - on vanc -if she spikes another temperature, plan to repeat blood cultures  Colostomy in place [facility]) Seen by ostomy nurse.  CKD (chronic kidney disease), stage III ([place])- (present on admission) Chronic kidney disease stage IIIb Hyponatremia Renal function noted to be close to baseline.  Monitor urine output.  Avoid nephrotoxic agents.    Hyponatremia -stable low, improving with above treatments -continue to monitor  Pustular rash- (present on admission) - pustular rash.  Differential diagnosis does include monkeypox.  Seen by ID and they feel that this is most likely secondary to MRSA.  Continue contact precautions.  Lesions appear to be improving.   Polysubstance abuse ([place])- (present on admission) -known history of IV drug use and polysubstance abuse.   -Urine drug screen positive for benzodiazepine and cocaine -continue to encourage cessation   GERD (gastroesophageal reflux disease) Chronic.  Hypertension- (present on admission) Resume amlodipine and carvedilol.  Anemia- (present on admission) Chronic for the most part.   -No overt bleeding noted.  Continue to monitor. -transfuse for <7    Severe malnutrition ([place])- (present on admission) Encourage oral intake.    Subjective: still with hip pain on the right  Objective Vitals:   11/30/21 2047 12/01/21 0500 12/01/21 0502 12/01/21 0841  BP: (!) 106/59  120/77 134/77  Pulse: 64  74 71  Resp: 17  16 18   Temp: 98.2 F (36.8 C)  99.9 F (37.7 C) 99 F (37.2 C)  TempSrc: Oral  Oral Oral  SpO2: 95%  96% 98%  Weight:  45 kg    Height:         General: Appearance:    Thin female in no acute distress     Lungs:       respirations unlabored  Heart:    Normal heart rate.    MS:   All extremities are intact. Pain w/ internal/external rotation of right hip   Neurologic:   Awake, alert     Data Reviewed: Hgb stable, Na trending up, Cr up slightly    Disposition: Status is: Inpatient  Remains inpatient appropriate because: needs MRI hip and treatment of her bacteremia   Time spent: 35 minutes  Author: Desma Maxim, MD 12/01/2021 10:41 AM  For on call review [email or web address].

## 2021-12-01 NOTE — Plan of Care (Signed)
°  Problem: Nutrition: Goal: Adequate nutrition will be maintained Outcome: Progressing   Problem: Safety: Goal: Ability to remain free from injury will improve Outcome: Progressing   Problem: Pain Managment: Goal: General experience of comfort will improve Outcome: Not Progressing   Problem: Skin Integrity: Goal: Risk for impaired skin integrity will decrease Outcome: Not Progressing

## 2021-12-02 ENCOUNTER — Inpatient Hospital Stay (HOSPITAL_COMMUNITY): Payer: Medicaid Other

## 2021-12-02 DIAGNOSIS — L89159 Pressure ulcer of sacral region, unspecified stage: Secondary | ICD-10-CM | POA: Diagnosis not present

## 2021-12-02 DIAGNOSIS — L03114 Cellulitis of left upper limb: Secondary | ICD-10-CM | POA: Diagnosis not present

## 2021-12-02 DIAGNOSIS — M169 Osteoarthritis of hip, unspecified: Secondary | ICD-10-CM | POA: Diagnosis not present

## 2021-12-02 LAB — BASIC METABOLIC PANEL
Anion gap: 12 (ref 5–15)
BUN: 18 mg/dL (ref 6–20)
CO2: 19 mmol/L — ABNORMAL LOW (ref 22–32)
Calcium: 8.8 mg/dL — ABNORMAL LOW (ref 8.9–10.3)
Chloride: 102 mmol/L (ref 98–111)
Creatinine, Ser: 1.31 mg/dL — ABNORMAL HIGH (ref 0.44–1.00)
GFR, Estimated: 48 mL/min — ABNORMAL LOW (ref >60)
Glucose, Bld: 90 mg/dL (ref 70–99)
Potassium: 3.9 mmol/L (ref 3.5–5.1)
Sodium: 133 mmol/L — ABNORMAL LOW (ref 135–145)

## 2021-12-02 LAB — SYNOVIAL CELL COUNT + DIFF, W/ CRYSTALS
Crystals, Fluid: NONE SEEN
Eosinophils-Synovial: 2 % — ABNORMAL HIGH (ref 0–1)
Lymphocytes-Synovial Fld: 15 % (ref 0–20)
Monocyte-Macrophage-Synovial Fluid: 6 % — ABNORMAL LOW (ref 50–90)
Neutrophil, Synovial: 77 % — ABNORMAL HIGH (ref 0–25)
WBC, Synovial: UNDETERMINED /mm3 (ref 0–200)

## 2021-12-02 LAB — CULTURE, BLOOD (ROUTINE X 2)
Culture: NO GROWTH
Culture: NO GROWTH
Special Requests: ADEQUATE
Special Requests: ADEQUATE

## 2021-12-02 LAB — VANCOMYCIN, TROUGH: Vancomycin Tr: 13 ug/mL — ABNORMAL LOW (ref 15–20)

## 2021-12-02 LAB — VANCOMYCIN, PEAK: Vancomycin Pk: 38 ug/mL (ref 30–40)

## 2021-12-02 MED ORDER — VANCOMYCIN HCL 750 MG/150ML IV SOLN
750.0000 mg | INTRAVENOUS | Status: DC
  Administered 2021-12-03 – 2021-12-11 (×5): 750 mg via INTRAVENOUS
  Filled 2021-12-02 (×8): qty 150

## 2021-12-02 MED ORDER — LIDOCAINE HCL (PF) 1 % IJ SOLN
5.0000 mL | Freq: Once | INTRAMUSCULAR | Status: AC
  Administered 2021-12-02: 5 mL via INTRADERMAL

## 2021-12-02 NOTE — Progress Notes (Signed)
PROGRESS NOTE    Sherry Baker  VQX:[id number] DOB: 04/28/1966 DOA: 11/25/2021 PCP: Pcp, No   Brief Narrative:  56 y.o. female with medical history significant for polysubstance abuse, including cocaine use as well as alcohol abuse, type I bipolar disorder, central hypertension, generalized anxiety disorder, stage IIIb chronic kidney disease with associated baseline creatinine 1.3-1.6, chronic anemia with baseline hemoglobin 10-12, who was admitted to [facility] on 11/18/2021 with acute metabolic encephalopathy after presenting from home to [facility] ED for evaluation of altered mental status.  Recently hospitalized from December 22 to January 4 for altered mental status.  At that time also she was on Precedex.  She went into respiratory failure had to be intubated.  Supposed to discharge to skilled nursing facility which the patient eventually declined and went home.  Presented back on 1/16 with altered mental status.  Again placed on Precedex infusion in the ICU.  Was found to have maculopapular rash which became pustular.  Etiology was unclear.  Left AMA from the hospital on 11/23/2021.  Patient return to the ER due to swelling of her hand.  There was also concern that the rash on her back could be from monkey pox.  Patient was hospitalized for further management.  Subsequently noted to have MRSA bacteremia.  Infectious disease was consulted.  S/p TEE which was negative Slow to improve with continued intermittent fevers.  Assessment & Plan:   Principal Problem:   Cellulitis of left hand Active Problems:   Severe malnutrition (HCC)   Anemia   Hypertension   Polysubstance abuse (HCC)   Pustular rash   Hyponatremia   CKD (chronic kidney disease), stage III (HCC)   Colostomy in place [facility])   MRSA bacteremia   Right hip pain  Cellulitis of left hand/MRSA bacteremia/history of IVDU- (present on admission).  MRSA bacteremia likely secondary to IVDU. Seen by orthopedics.  Plan is to continue  with the antibiotic treatment.  Pain controlled. -warm soaks with Hibiclens.  Also seen by ID and they are managing antibiotics.  Currently she is on vancomycin. Echocardiogram does not show any obvious vegetations.   -TEE negative -repeat BC NGTD   Right hip pain- (present on admission) -x ray negative. Mri suspicious for possible septic joint though known OA also ddx.  Ortho on board.  Underwent aspiration, awaiting results of the culture.  Ortho recommends THA however also recommends that she needs to be clean for 6 months and healthier.   Colostomy in place [facility]) Seen by ostomy nurse.   CKD (chronic kidney disease), stage III ([place])- (present on admission): Renal function at baseline.   Hyponatremia: Mild and improving.  Pustular rash- (present on admission) - pustular rash.  Differential diagnosis does include monkeypox.  Seen by ID and they feel that this is most likely secondary to MRSA.  Continue contact precautions.  Lesions appear to be improving.    Polysubstance abuse ([place])- (present on admission) -known history of IV drug use and polysubstance abuse.   -Urine drug screen positive for benzodiazepine and cocaine -continue to encourage cessation    GERD (gastroesophageal reflux disease) Chronic.  Continue PPI.   Hypertension- (present on admission): Blood pressure controlled.  Continue amlodipine and Coreg.   Anemia- (present on admission) Chronic for the most part.   -No overt bleeding noted.  Continue to monitor. -transfuse for <7     Severe malnutrition ([place])- (present on admission) Encourage oral intake.    DVT prophylaxis: enoxaparin (LOVENOX) injection 30 mg Start: 11/25/21  1600 SCDs Start: 11/25/21 0434   Code Status: Full Code  Family Communication:  None present at bedside.  Plan of care discussed with patient in length and he/she verbalized understanding and agreed with it.  Status is: Inpatient  Remains inpatient appropriate because: Needs IV  antibiotics and further work-up of MRSA bacteremia.  Estimated body mass index is 18.06 kg/m as calculated from the following:   Height as of this encounter: 5\' 2"  (1.575 m).   Weight as of this encounter: 44.8 kg.  Pressure Injury 10/25/21 Sacrum Medial Stage 3 -  Full thickness tissue loss. Subcutaneous fat may be visible but bone, tendon or muscle are NOT exposed. (Active)  10/25/21 1421  Location: Sacrum  Location Orientation: Medial  Staging: Stage 3 -  Full thickness tissue loss. Subcutaneous fat may be visible but bone, tendon or muscle are NOT exposed.  Wound Description (Comments):   Present on Admission: Yes   Nutritional Assessment: Body mass index is 18.06 kg/m.Marland Kitchen Seen by dietician.  I agree with the assessment and plan as outlined below: Nutrition Status:   . Skin Assessment: I have examined the patient's skin and I agree with the wound assessment as performed by the wound care RN as outlined below: Pressure Injury 10/25/21 Sacrum Medial Stage 3 -  Full thickness tissue loss. Subcutaneous fat may be visible but bone, tendon or muscle are NOT exposed. (Active)  10/25/21 1421  Location: Sacrum  Location Orientation: Medial  Staging: Stage 3 -  Full thickness tissue loss. Subcutaneous fat may be visible but bone, tendon or muscle are NOT exposed.  Wound Description (Comments):   Present on Admission: Yes    Consultants:  Orthopedics ID  Procedures:  As above  Antimicrobials:  Anti-infectives (From admission, onward)    Start     Dose/Rate Route Frequency Ordered Stop   12/03/21 1000  vancomycin (VANCOREADY) IVPB 750 mg/150 mL        750 mg 150 mL/hr over 60 Minutes Intravenous Every 48 hours 12/02/21 0556     11/30/21 0000  vancomycin (VANCOREADY) IVPB 1250 mg/250 mL  Status:  Discontinued        1,250 mg 166.7 mL/hr over 90 Minutes Intravenous Every 48 hours 11/29/21 0933 11/29/21 0940   11/30/21 0000  vancomycin (VANCOREADY) IVPB 500 mg/100 mL  Status:   Discontinued        500 mg 100 mL/hr over 60 Minutes Intravenous Every 24 hours 11/29/21 0940 12/02/21 0556   11/28/21 1200  vancomycin (VANCOREADY) IVPB 750 mg/150 mL  Status:  Discontinued        750 mg 150 mL/hr over 60 Minutes Intravenous Every 36 hours 11/27/21 0917 11/27/21 0919   11/28/21 0000  vancomycin (VANCOREADY) IVPB 500 mg/100 mL  Status:  Discontinued        500 mg 100 mL/hr over 60 Minutes Intravenous Every 24 hours 11/27/21 0919 11/29/21 0933   11/27/21 0000  vancomycin (VANCOREADY) IVPB 750 mg/150 mL  Status:  Discontinued        750 mg 150 mL/hr over 60 Minutes Intravenous Every 48 hours 11/25/21 0422 11/27/21 0917   11/26/21 0400  cefTRIAXone (ROCEPHIN) 1 g in sodium chloride 0.9 % 100 mL IVPB  Status:  Discontinued        1 g 200 mL/hr over 30 Minutes Intravenous Every 24 hours 11/25/21 0434 11/25/21 1149   11/25/21 0430  vancomycin (VANCOCIN) IVPB 1000 mg/200 mL premix        1,000 mg 200 mL/hr  over 60 Minutes Intravenous  Once 11/25/21 0422 11/25/21 0552   11/25/21 0315  cefTRIAXone (ROCEPHIN) 2 g in sodium chloride 0.9 % 100 mL IVPB        2 g 200 mL/hr over 30 Minutes Intravenous  Once 11/25/21 0311 11/25/21 0435         Subjective: Patient seen and examined.  She was complaining of hip pain after she had the aspiration done but the pain was mild and controlled and she had no other complaints.  Objective: Vitals:   12/01/21 0841 12/01/21 2055 12/02/21 0255 12/02/21 0820  BP: 134/77 127/72 125/68 (!) 122/56  Pulse: 71 70 (!) 57 72  Resp: 18 17 17 17   Temp: 99 F (37.2 C) 99.3 F (37.4 C) 98.6 F (37 C) 98.2 F (36.8 C)  TempSrc: Oral Oral Oral Oral  SpO2: 98% 98% 99% 98%  Weight:   44.8 kg   Height:       No intake or output data in the 24 hours ending 12/02/21 1325 Filed Weights   11/30/21 0404 12/01/21 0500 12/02/21 0255  Weight: 48.1 kg 45 kg 44.8 kg    Examination:  General exam: Appears calm and comfortable Respiratory system: Clear  to auscultation. Respiratory effort normal. Cardiovascular system: S1 & S2 heard, RRR. No JVD, murmurs, rubs, gallops or clicks. No pedal edema. Gastrointestinal system: Abdomen is nondistended, soft and nontender. No organomegaly or masses felt. Normal bowel sounds heard. Central nervous system: Alert and oriented. No focal neurological deficits. Skin: No rashes, lesions or ulcers Psychiatry: Judgement and insight appear normal. Mood & affect appropriate.    Data Reviewed: I have personally reviewed following labs and imaging studies  CBC: Recent Labs  Lab 11/27/21 0133 11/28/21 0105 11/29/21 0247 11/30/21 0120 12/01/21 0153  WBC 7.2 5.4 4.6 4.4 5.1  HGB 8.1* 7.9* 7.3* 7.6* 7.8*  HCT 25.6* 23.8* 22.1* 24.0* 24.4*  MCV 89.5 86.9 85.3 87.6 87.5  PLT 176 180 199 200 623   Basic Metabolic Panel: Recent Labs  Lab 11/28/21 0105 11/29/21 0247 11/30/21 0120 12/01/21 0153 12/02/21 0436  NA 129* 127* 131* 132* 133*  K 3.8 3.4* 3.8 3.5 3.9  CL 101 98 102 102 102  CO2 21* 20* 20* 20* 19*  GLUCOSE 102* 101* 95 91 90  BUN 14 12 16 19 18   CREATININE 1.36* 1.38* 1.43* 1.51* 1.31*  CALCIUM 8.8* 8.5* 8.6* 8.7* 8.8*   GFR: Estimated Creatinine Clearance: 34.7 mL/min (A) (by C-G formula based on SCr of 1.31 mg/dL (H)). Liver Function Tests: Recent Labs  Lab 11/25/21 2142  AST 16  ALT 13  ALKPHOS 66  BILITOT 0.4  PROT 6.9  ALBUMIN 2.4*   No results for input(s): LIPASE, AMYLASE in the last 168 hours. No results for input(s): AMMONIA in the last 168 hours. Coagulation Profile: No results for input(s): INR, PROTIME in the last 168 hours. Cardiac Enzymes: No results for input(s): CKTOTAL, CKMB, CKMBINDEX, TROPONINI in the last 168 hours. BNP (last 3 results) No results for input(s): PROBNP in the last 8760 hours. HbA1C: No results for input(s): HGBA1C in the last 72 hours. CBG: No results for input(s): GLUCAP in the last 168 hours. Lipid Profile: No results for input(s):  CHOL, HDL, LDLCALC, TRIG, CHOLHDL, LDLDIRECT in the last 72 hours. Thyroid Function Tests: No results for input(s): TSH, T4TOTAL, FREET4, T3FREE, THYROIDAB in the last 72 hours. Anemia Panel: No results for input(s): VITAMINB12, FOLATE, FERRITIN, TIBC, IRON, RETICCTPCT in the last 72 hours.  Sepsis Labs: No results for input(s): PROCALCITON, LATICACIDVEN in the last 168 hours.  Recent Results (from the past 240 hour(s))  Culture, blood (Routine x 2)     Status: Abnormal   Collection Time: 11/25/21  1:58 AM   Specimen: BLOOD  Result Value Ref Range Status   Specimen Description BLOOD RIGHT ANTECUBITAL  Final   Special Requests   Final    BOTTLES DRAWN AEROBIC AND ANAEROBIC Blood Culture adequate volume   Culture  Setup Time   Final    GRAM POSITIVE COCCI IN CLUSTERS IN BOTH AEROBIC AND ANAEROBIC BOTTLES Organism ID to follow CRITICAL RESULT CALLED TO, READ BACK BY AND VERIFIED WITH: J. FRENS PHARMD, AT 2355 11/25/21 D. VANHOOK Performed at [facility], [place] [address], [place], [place] [address]    Culture METHICILLIN RESISTANT STAPHYLOCOCCUS AUREUS (A)  Final   Report Status 11/27/2021 FINAL  Final   Organism ID, Bacteria METHICILLIN RESISTANT STAPHYLOCOCCUS AUREUS  Final      Susceptibility   Methicillin resistant staphylococcus aureus - MIC*    CIPROFLOXACIN >=8 RESISTANT Resistant     ERYTHROMYCIN >=8 RESISTANT Resistant     GENTAMICIN <=0.5 SENSITIVE Sensitive     OXACILLIN >=4 RESISTANT Resistant     TETRACYCLINE <=1 SENSITIVE Sensitive     VANCOMYCIN <=0.5 SENSITIVE Sensitive     TRIMETH/SULFA >=320 RESISTANT Resistant     CLINDAMYCIN <=0.25 SENSITIVE Sensitive     RIFAMPIN <=0.5 SENSITIVE Sensitive     Inducible Clindamycin NEGATIVE Sensitive     * METHICILLIN RESISTANT STAPHYLOCOCCUS AUREUS  Blood Culture ID Panel (Reflexed)     Status: Abnormal   Collection Time: 11/25/21  1:58 AM  Result Value Ref Range Status   Enterococcus faecalis NOT DETECTED NOT DETECTED  Final   Enterococcus Faecium NOT DETECTED NOT DETECTED Final   Listeria monocytogenes NOT DETECTED NOT DETECTED Final   Staphylococcus species DETECTED (A) NOT DETECTED Final    Comment: CRITICAL RESULT CALLED TO, READ BACK BY AND VERIFIED WITH: J. FRENS PHARMD, AT 2542 11/25/21 D. VANHOOK    Staphylococcus aureus (BCID) DETECTED (A) NOT DETECTED Final    Comment: Methicillin (oxacillin)-resistant Staphylococcus aureus (MRSA). MRSA is predictably resistant to beta-lactam antibiotics (except ceftaroline). Preferred therapy is vancomycin unless clinically contraindicated. Patient requires contact precautions if  hospitalized. CRITICAL RESULT CALLED TO, READ BACK BY AND VERIFIED WITH: J. FRENS PHARMD, AT 7062 11/25/21 D. VANHOOK    Staphylococcus epidermidis NOT DETECTED NOT DETECTED Final   Staphylococcus lugdunensis NOT DETECTED NOT DETECTED Final   Streptococcus species NOT DETECTED NOT DETECTED Final   Streptococcus agalactiae NOT DETECTED NOT DETECTED Final   Streptococcus pneumoniae NOT DETECTED NOT DETECTED Final   Streptococcus pyogenes NOT DETECTED NOT DETECTED Final   A.calcoaceticus-baumannii NOT DETECTED NOT DETECTED Final   Bacteroides fragilis NOT DETECTED NOT DETECTED Final   Enterobacterales NOT DETECTED NOT DETECTED Final   Enterobacter cloacae complex NOT DETECTED NOT DETECTED Final   Escherichia coli NOT DETECTED NOT DETECTED Final   Klebsiella aerogenes NOT DETECTED NOT DETECTED Final   Klebsiella oxytoca NOT DETECTED NOT DETECTED Final   Klebsiella pneumoniae NOT DETECTED NOT DETECTED Final   Proteus species NOT DETECTED NOT DETECTED Final   Salmonella species NOT DETECTED NOT DETECTED Final   Serratia marcescens NOT DETECTED NOT DETECTED Final   Haemophilus influenzae NOT DETECTED NOT DETECTED Final   Neisseria meningitidis NOT DETECTED NOT DETECTED Final   Pseudomonas aeruginosa NOT DETECTED NOT DETECTED Final   Stenotrophomonas maltophilia NOT  DETECTED NOT DETECTED  Final   Candida albicans NOT DETECTED NOT DETECTED Final   Candida auris NOT DETECTED NOT DETECTED Final   Candida glabrata NOT DETECTED NOT DETECTED Final   Candida krusei NOT DETECTED NOT DETECTED Final   Candida parapsilosis NOT DETECTED NOT DETECTED Final   Candida tropicalis NOT DETECTED NOT DETECTED Final   Cryptococcus neoformans/gattii NOT DETECTED NOT DETECTED Final   Meth resistant mecA/C and MREJ DETECTED (A) NOT DETECTED Final    Comment: CRITICAL RESULT CALLED TO, READ BACK BY AND VERIFIED WITH: J. FRENS PHARMD, AT 2130 11/25/21 D. VANHOOK Performed at [facility], [place] [address], [place], [place] [address]   Culture, blood (Routine x 2)     Status: Abnormal   Collection Time: 11/25/21  2:05 AM   Specimen: BLOOD RIGHT HAND  Result Value Ref Range Status   Specimen Description BLOOD RIGHT HAND  Final   Special Requests   Final    BOTTLES DRAWN AEROBIC AND ANAEROBIC Blood Culture adequate volume   Culture  Setup Time   Final    GRAM POSITIVE COCCI IN BOTH AEROBIC AND ANAEROBIC BOTTLES CRITICAL VALUE NOTED.  VALUE IS CONSISTENT WITH PREVIOUSLY REPORTED AND CALLED VALUE.    Culture (A)  Final    STAPHYLOCOCCUS AUREUS SUSCEPTIBILITIES PERFORMED ON PREVIOUS CULTURE WITHIN THE LAST 5 DAYS. Performed at [facility], [place] [address], [place], [place] [address]    Report Status 11/27/2021 FINAL  Final  Resp Panel by RT-PCR (Flu A&B, Covid) Nasopharyngeal Swab     Status: None   Collection Time: 11/25/21  4:20 AM   Specimen: Nasopharyngeal Swab; Nasopharyngeal(NP) swabs in vial transport medium  Result Value Ref Range Status   SARS Coronavirus 2 by RT PCR NEGATIVE NEGATIVE Final    Comment: (NOTE) SARS-CoV-2 target nucleic acids are NOT DETECTED.  The SARS-CoV-2 RNA is generally detectable in upper respiratory specimens during the acute phase of infection. The lowest concentration of SARS-CoV-2 viral copies this assay can detect is 138 copies/mL. A negative  result does not preclude SARS-Cov-2 infection and should not be used as the sole basis for treatment or other patient management decisions. A negative result may occur with  improper specimen collection/handling, submission of specimen other than nasopharyngeal swab, presence of viral mutation(s) within the areas targeted by this assay, and inadequate number of viral copies(<138 copies/mL). A negative result must be combined with clinical observations, patient history, and epidemiological information. The expected result is Negative.  Fact Sheet for Patients:  EntrepreneurPulse.com.au  Fact Sheet for Healthcare Providers:  IncredibleEmployment.be  This test is no t yet approved or cleared by the [place] FDA and  has been authorized for detection and/or diagnosis of SARS-CoV-2 by FDA under an Emergency Use Authorization (EUA). This EUA will remain  in effect (meaning this test can be used) for the duration of the COVID-19 declaration under Section 564(b)(1) of the Act, 21 U.S.C.section 360bbb-3(b)(1), unless the authorization is terminated  or revoked sooner.       Influenza A by PCR NEGATIVE NEGATIVE Final   Influenza B by PCR NEGATIVE NEGATIVE Final    Comment: (NOTE) The Xpert Xpress SARS-CoV-2/FLU/RSV plus assay is intended as an aid in the diagnosis of influenza from Nasopharyngeal swab specimens and should not be used as a sole basis for treatment. Nasal washings and aspirates are unacceptable for Xpert Xpress SARS-CoV-2/FLU/RSV testing.  Fact Sheet for Patients: EntrepreneurPulse.com.au  Fact Sheet for Healthcare Providers: IncredibleEmployment.be  This test  is not yet approved or cleared by the [place] and has been authorized for detection and/or diagnosis of SARS-CoV-2 by FDA under an Emergency Use Authorization (EUA). This EUA will remain in effect (meaning this test can be used)  for the duration of the COVID-19 declaration under Section 564(b)(1) of the Act, 21 U.S.C. section 360bbb-3(b)(1), unless the authorization is terminated or revoked.  Performed at [facility], [place] [address], [place], [place] [address]   Varicella-zoster by PCR (Blood or Swab)     Status: None   Collection Time: 11/25/21  9:54 AM   Specimen: Sterile Swab  Result Value Ref Range Status   Varicella-Zoster, PCR Negative Negative Final    Comment: (NOTE) No Varicella Zoster Virus DNA detected. This test was developed and its performance characteristics determined by [facility].  It has not been cleared or approved by the Food and Drug Administration.  The FDA has determined that such clearance or approval is not necessary. Performed At: [facility] [place], [place] [id number] Rush Farmer MD MO:[id number]   MRSA Next Gen by PCR, Nasal     Status: Abnormal   Collection Time: 11/25/21  2:00 PM  Result Value Ref Range Status   MRSA by PCR Next Gen DETECTED (A) NOT DETECTED Final    Comment: MELAINIE FLORES RN 11/25/21 @2009  BY JW (NOTE) The GeneXpert MRSA Assay (FDA approved for NASAL specimens only), is one component of a comprehensive MRSA colonization surveillance program. It is not intended to diagnose MRSA infection nor to guide or monitor treatment for MRSA infections. Test performance is not FDA approved in patients less than 28 years old. Performed at [facility], [place] [address], [place], [place] [address]   Culture, blood (Routine X 2) w Reflex to ID Panel     Status: None   Collection Time: 11/27/21  9:54 AM   Specimen: BLOOD LEFT HAND  Result Value Ref Range Status   Specimen Description BLOOD LEFT HAND  Final   Special Requests   Final    BOTTLES DRAWN AEROBIC AND ANAEROBIC Blood Culture adequate volume   Culture   Final    NO GROWTH 5 DAYS Performed at [facility], [place] [address] [place], [place] [address]    Report  Status 12/02/2021 FINAL  Final  Culture, blood (Routine X 2) w Reflex to ID Panel     Status: None   Collection Time: 11/27/21  9:54 AM   Specimen: BLOOD RIGHT HAND  Result Value Ref Range Status   Specimen Description BLOOD RIGHT HAND  Final   Special Requests   Final    BOTTLES DRAWN AEROBIC AND ANAEROBIC Blood Culture adequate volume   Culture   Final    NO GROWTH 5 DAYS Performed at [facility], [place] [address] [place], [place] [address]    Report Status 12/02/2021 FINAL  Final     Radiology Studies: MR HIP RIGHT WO CONTRAST  Result Date: 11/30/2021 CLINICAL DATA:  Hip pain, infection suspected, xray done MRSA bacteremia EXAM: MR OF THE RIGHT HIP WITHOUT CONTRAST TECHNIQUE: Multiplanar, multisequence MR imaging was performed. No intravenous contrast was administered. COMPARISON:  X-ray 11/26/2021.  CT 12/18/2018 and 01/17/2017 FINDINGS: Bones/Joint/Cartilage Severe, end-stage degenerative changes of the bilateral hip joints with complete joint space loss, bulky marginal osteophytes, and extensive subchondral cyst formation along both sides of the joint. There is osseous remodeling of both the femoral heads and bilateral acetabula. Patchy bone  marrow edema throughout the bilateral femoral head and neck regions as well as within the bilateral acetabula, likely reactive and degenerative. No evidence of acute fracture. No dislocation. Small-moderate-sized right and trace left joint effusions, likely reactive. Bony pelvis intact without fracture or diastasis. Mild discogenic endplate marrow changes at L4-5 and L5-S1. No findings to suggest discitis within the visualized lumbar spine. Ligaments Intact. Muscles and Tendons Intramuscular edema within the bilateral iliacus musculature and within the adductor compartments of the bilateral hips, right worse than left. No acute tendon tear. Soft tissues No extra-articular fluid collections. Trace free fluid within the pelvis, nonspecific.  IMPRESSION: 1. Severe, end-stage degenerative changes of the bilateral hip joints. 2. Small-moderate-sized right hip joint effusion, likely degenerative/reactive. Although felt to be less likely, septic arthritis is not entirely excluded. 3. Intramuscular edema within the bilateral iliacus musculature and within the adductor compartments of the bilateral hips, right worse than left. Electronically Signed   By: Davina Poke D.O.   On: 11/30/2021 15:56   DG FLUORO GUIDED NEEDLE PLC ASPIRATION/INJECTION LOC  Result Date: 12/02/2021 CLINICAL DATA:  Hip pain, hip aspiration EXAM: RIGHT HIP INJECTION UNDER FLUOROSCOPY COMPARISON:  MRI 11/30/2021 FLUOROSCOPY TIME:  Fluoroscopy Time:  12 seconds Radiation Exposure Index (if provided by the fluoroscopic device): 0.3 mGy Number of Acquired Spot Images: 1 PROCEDURE: Scout fluoroscopic image demonstrates severe right hip osteoarthritis with articular surface destruction in productive bony change. There is superolateral positioning of the femoral head with respect to the acetabulum. Overlying skin prepped with Betadine, draped in the usual sterile fashion, and infiltrated locally with buffered Lidocaine. An 18 gauge spinal needle advanced to the inferior medial margin of the right femoral neck and aspiration performed. Approximately 1 mL of bloody fluid was aspirated from the right hip joint. No immediate complication. IMPRESSION: Successful fluoroscopic guided right hip aspiration. Approximately 1 mL of bloody fluid was aspirated from the right hip joint and sent to the laboratory for analysis. Electronically Signed   By: Maurine Simmering M.D.   On: 12/02/2021 09:49    Scheduled Meds:  amLODipine  5 mg Oral Daily   busPIRone  15 mg Oral BID   carvedilol  3.125 mg Oral BID WC   Chlorhexidine Gluconate Cloth  6 each Topical Q0600   citalopram  10 mg Oral Daily   diclofenac Sodium  2 g Topical QID   enoxaparin (LOVENOX) injection  30 mg Subcutaneous Z56L   folic acid   1 mg Oral Daily   gabapentin  200 mg Oral TID   mupirocin ointment   Nasal BID   pantoprazole  40 mg Oral Daily   senna-docusate  2 tablet Oral QHS   thiamine  100 mg Oral Daily   Continuous Infusions:  [START ON 12/03/2021] vancomycin       LOS: 7 days   Time spent: 32 minutes  Darliss Cheney, MD [facility]  12/02/2021, 1:25 PM  Please page via [facility] [place] and do not message via secure chat for urgent patient care matters. Secure chat can be used for non urgent patient care matters.  How to contact the [facility] Attending or Consulting provider [place] or covering provider during after hours [place], for this patient?  Check the care team in [facility] and look for a) attending/consulting [facility] provider listed and b) the [facility] team listed. Page or secure chat [id number]. Log into [email or web address] and use [facility]'s universal password to access. If you do not have the password, please contact the hospital  operator. Locate the [facility] provider you are looking for under [facility] and page to a number that you can be directly reached. If you still have difficulty reaching the provider, please page the [facility] (Director on Call) for the Hospitalists listed on amion for assistance.

## 2021-12-02 NOTE — Progress Notes (Signed)
[place] for Infectious Disease    Date of Admission:  11/25/2021   Total days of antibiotics 9- vanco          ID: Sherry Baker is a 56 y.o. female with   Principal Problem:   Cellulitis of left hand Active Problems:   Severe malnutrition (HCC)   Anemia   Hypertension   Polysubstance abuse (HCC)   Pustular rash   Hyponatremia   CKD (chronic kidney disease), stage III (HCC)   Colostomy in place [facility])   MRSA bacteremia   Right hip pain    Subjective: Afebrile, still ongoing hip pain, underwent aspiration of hip. Otherwise 12 point ros is negative  Medications:   amLODipine  5 mg Oral Daily   busPIRone  15 mg Oral BID   carvedilol  3.125 mg Oral BID WC   Chlorhexidine Gluconate Cloth  6 each Topical Q0600   citalopram  10 mg Oral Daily   diclofenac Sodium  2 g Topical QID   enoxaparin (LOVENOX) injection  30 mg Subcutaneous T01S   folic acid  1 mg Oral Daily   gabapentin  200 mg Oral TID   mupirocin ointment   Nasal BID   pantoprazole  40 mg Oral Daily   senna-docusate  2 tablet Oral QHS   thiamine  100 mg Oral Daily    Objective: Vital signs in last 24 hours: Temp:  [98.2 F (36.8 C)-99.3 F (37.4 C)] 98.2 F (36.8 C) (01/30 0820) Pulse Rate:  [57-72] 72 (01/30 0820) Resp:  [17] 17 (01/30 0820) BP: (122-127)/(56-72) 122/56 (01/30 0820) SpO2:  [98 %-99 %] 98 % (01/30 0820) Weight:  [44.8 kg] 44.8 kg (01/30 0255) Physical Exam  Constitutional:  oriented to person, place, and time. appears well-developed and well-nourished. No distress.  HENT: [place]/AT, PERRLA, no scleral icterus Mouth/Throat: Oropharynx is clear and moist. No oropharyngeal exudate.  Cardiovascular: Normal rate, regular rhythm and normal heart sounds. Exam reveals no gallop and no friction rub.  No murmur heard.  Pulmonary/Chest: Effort normal and breath sounds normal. No respiratory distress.  has no wheezes.  Neck = supple, no nuchal rigidity Abdominal: Soft. Bowel sounds are normal.   exhibits no distension. There is no tenderness. Ostomy in place Back: skin lesions improving Lymphadenopathy: no cervical adenopathy. No axillary adenopathy Neurological: alert and oriented to person, place, and time.  Skin: Skin is warm and dry. No rash noted. No erythema.  Psychiatric: a normal mood and affect.  behavior is normal.   Lab Results Recent Labs    11/30/21 0120 12/01/21 0153 12/02/21 0436  WBC 4.4 5.1  --   HGB 7.6* 7.8*  --   HCT 24.0* 24.4*  --   NA 131* 132* 133*  K 3.8 3.5 3.9  CL 102 102 102  CO2 20* 20* 19*  BUN 16 19 18   CREATININE 1.43* 1.51* 1.31*     Microbiology: 1/30 aspirate cx - pending 1/25blood cx - no growth Studies/Results: MR HIP RIGHT WO CONTRAST  Result Date: 11/30/2021 CLINICAL DATA:  Hip pain, infection suspected, xray done MRSA bacteremia EXAM: MR OF THE RIGHT HIP WITHOUT CONTRAST TECHNIQUE: Multiplanar, multisequence MR imaging was performed. No intravenous contrast was administered. COMPARISON:  X-ray 11/26/2021.  CT 12/18/2018 and 01/17/2017 FINDINGS: Bones/Joint/Cartilage Severe, end-stage degenerative changes of the bilateral hip joints with complete joint space loss, bulky marginal osteophytes, and extensive subchondral cyst formation along both sides of the joint. There is osseous remodeling of both the femoral  heads and bilateral acetabula. Patchy bone marrow edema throughout the bilateral femoral head and neck regions as well as within the bilateral acetabula, likely reactive and degenerative. No evidence of acute fracture. No dislocation. Small-moderate-sized right and trace left joint effusions, likely reactive. Bony pelvis intact without fracture or diastasis. Mild discogenic endplate marrow changes at L4-5 and L5-S1. No findings to suggest discitis within the visualized lumbar spine. Ligaments Intact. Muscles and Tendons Intramuscular edema within the bilateral iliacus musculature and within the adductor compartments of the bilateral  hips, right worse than left. No acute tendon tear. Soft tissues No extra-articular fluid collections. Trace free fluid within the pelvis, nonspecific. IMPRESSION: 1. Severe, end-stage degenerative changes of the bilateral hip joints. 2. Small-moderate-sized right hip joint effusion, likely degenerative/reactive. Although felt to be less likely, septic arthritis is not entirely excluded. 3. Intramuscular edema within the bilateral iliacus musculature and within the adductor compartments of the bilateral hips, right worse than left. Electronically Signed   By: Davina Poke D.O.   On: 11/30/2021 15:56   DG FLUORO GUIDED NEEDLE PLC ASPIRATION/INJECTION LOC  Result Date: 12/02/2021 CLINICAL DATA:  Hip pain, hip aspiration EXAM: RIGHT HIP INJECTION UNDER FLUOROSCOPY COMPARISON:  MRI 11/30/2021 FLUOROSCOPY TIME:  Fluoroscopy Time:  12 seconds Radiation Exposure Index (if provided by the fluoroscopic device): 0.3 mGy Number of Acquired Spot Images: 1 PROCEDURE: Scout fluoroscopic image demonstrates severe right hip osteoarthritis with articular surface destruction in productive bony change. There is superolateral positioning of the femoral head with respect to the acetabulum. Overlying skin prepped with Betadine, draped in the usual sterile fashion, and infiltrated locally with buffered Lidocaine. An 18 gauge spinal needle advanced to the inferior medial margin of the right femoral neck and aspiration performed. Approximately 1 mL of bloody fluid was aspirated from the right hip joint. No immediate complication. IMPRESSION: Successful fluoroscopic guided right hip aspiration. Approximately 1 mL of bloody fluid was aspirated from the right hip joint and sent to the laboratory for analysis. Electronically Signed   By: Maurine Simmering M.D.   On: 12/02/2021 09:49     Assessment/Plan: MRSA disseminated infection = continue with vancomycin. Awaiting cell count and hip aspirate culture to see if she needs washout. TEE  negative. Once culture results return - can discuss length of treatment.   Severe hip oa = continue with current pain management. Unlikely to get hip replacement until improved nutrition and no longer at risk for infection for opiate use disorder  Pustular rash = improving  Sacral decub = continue with local wound care  [facility] for Infectious Diseases Pager: [phone]  12/02/2021, 12:20 PM

## 2021-12-02 NOTE — Consult Note (Signed)
Reason for Consult:Right hip pain Referring Physician: Darliss Cheney Time called: [id number] Time at bedside: [place] is an 56 y.o. female.  HPI: Sherry has been admitted for a week with bacteremia and left hand cellulitis. She has also been c/o right hip pain since admission. This is a chronic problem for her. A CT was done which showed a small joint effusion and orthopedic surgery was consulted to r/o septic arthritis. She notes the hip feels a little better after the aspiration this morning.  Past Medical History:  Diagnosis Date   Alcohol abuse    Allergy    PCNS swelling   Arthritis    Bipolar 1 disorder ([place])    Cancer ([place]) 01/21/2017   rectal cancer   Cancer ([place])    Cirrhosis of liver ([place])    COVID-19 virus infection 06/23/2021   Delirium tremens ([place]) 06/22/2021   Depression    Genetic testing 03/24/2017   [name] underwent genetic counseling and testing for hereditary cancer syndromes on 02/17/2017. Her results were negative for mutations in all 46 genes analyzed by [facility]'s 46-gene Common Hereditary Cancers Panel. Genes analyzed include: APC, ATM, AXIN2, BARD1, BMPR1A, BRCA1, BRCA2, BRIP1, CDH1, CDKN2A, CHEK2, CTNNA1, DICER1, EPCAM, GREM1, HOXB13, KIT, MEN1, MLH1, MSH2, MSH3, MSH6, MUTYH, NBN,   Hypertension    Suicidal ideation 02/11/2014    Past Surgical History:  Procedure Laterality Date   ABDOMINAL PERINEAL BOWEL RESECTION N/A 06/18/2017   Procedure: ABDOMINAL PERINEAL RESECTION ERAS PATHWAY;  Surgeon: Leighton Ruff, MD;  Location: [facility];  Service: General;  Laterality: N/A;   BUBBLE STUDY  11/29/2021   Procedure: BUBBLE STUDY;  Surgeon: Berniece Salines, DO;  Location: [facility];  Service: Cardiovascular;;   COLON SURGERY     COLONOSCOPY WITH PROPOFOL Left 01/21/2017   Procedure: COLONOSCOPY WITH PROPOFOL;  Surgeon: Otis Brace, MD;  Location: [facility] [facility];  Service: Gastroenterology;  Laterality: Left;   FRACTURE SURGERY     MANDIBLE FRACTURE  SURGERY     TEE WITHOUT CARDIOVERSION N/A 11/29/2021   Procedure: TRANSESOPHAGEAL ECHOCARDIOGRAM (TEE);  Surgeon: Berniece Salines, DO;  Location: [facility];  Service: Cardiovascular;  Laterality: N/A;    Family History  Problem Relation Age of Onset   Cancer Mother 62       Originating in the abdomen, otherwise unknwon   Cancer - Other Maternal Aunt        Throat    Social History:  reports that she has been smoking cigarettes. She has a 19.50 pack-year smoking history. She has never used smokeless tobacco. She reports current alcohol use. She reports current drug use. Drugs: "Crack" cocaine and Cocaine.  Allergies:  Allergies  Allergen Reactions   Penicillins Hives and Swelling    Has patient had a PCN reaction causing immediate rash, facial/tongue/throat swelling, SOB or lightheadedness with hypotension: Yes Has patient had a PCN reaction causing severe rash involving mucus membranes or skin necrosis: No Has patient had a PCN reaction that required hospitalization No Has patient had a PCN reaction occurring within the last 10 years: Yes If all of the above answers are "NO", then may proceed with Cephalosporin use.     Medications: I have reviewed the patient's current medications.  Results for orders placed or performed during the hospital encounter of 11/25/21 (from the past 48 hour(s))  CBC     Status: Abnormal   Collection Time: 12/01/21  1:53 AM  Result Value Ref Range   WBC 5.1 4.0 - 10.5  K/uL   RBC 2.79 (L) 3.87 - 5.11 MIL/uL   Hemoglobin 7.8 (L) 12.0 - 15.0 g/dL   HCT 24.4 (L) 36.0 - 46.0 %   MCV 87.5 80.0 - 100.0 fL   MCH 28.0 26.0 - 34.0 pg   MCHC 32.0 30.0 - 36.0 g/dL   RDW 16.7 (H) 11.5 - 15.5 %   Platelets 262 150 - 400 K/uL   nRBC 0.0 0.0 - 0.2 %    Comment: Performed at [facility] [address] [place], [place] [address]  Basic metabolic panel     Status: Abnormal   Collection Time: 12/01/21  1:53 AM  Result Value Ref Range   Sodium 132 (L) 135  - 145 mmol/L   Potassium 3.5 3.5 - 5.1 mmol/L   Chloride 102 98 - 111 mmol/L   CO2 20 (L) 22 - 32 mmol/L   Glucose, Bld 91 70 - 99 mg/dL    Comment: Glucose reference range applies only to samples taken after fasting for at least 8 hours.   BUN 19 6 - 20 mg/dL   Creatinine, Ser 1.51 (H) 0.44 - 1.00 mg/dL   Calcium 8.7 (L) 8.9 - 10.3 mg/dL   GFR, Estimated 41 (L) >60 mL/min    Comment: (NOTE) Calculated using the CKD-EPI Creatinine Equation (2021)    Anion gap 10 5 - 15    Comment: Performed at [facility] [address], [place], [place] [address]  Vancomycin, trough     Status: Abnormal   Collection Time: 12/02/21 12:26 AM  Result Value Ref Range   Vancomycin Tr 13 (L) 15 - 20 ug/mL    Comment: Performed at [facility], [place] [address] [place],  [address]  Basic metabolic panel     Status: Abnormal   Collection Time: 12/02/21  4:36 AM  Result Value Ref Range   Sodium 133 (L) 135 - 145 mmol/L   Potassium 3.9 3.5 - 5.1 mmol/L   Chloride 102 98 - 111 mmol/L   CO2 19 (L) 22 - 32 mmol/L   Glucose, Bld 90 70 - 99 mg/dL    Comment: Glucose reference range applies only to samples taken after fasting for at least 8 hours.   BUN 18 6 - 20 mg/dL   Creatinine, Ser 1.31 (H) 0.44 - 1.00 mg/dL   Calcium 8.8 (L) 8.9 - 10.3 mg/dL   GFR, Estimated 48 (L) >60 mL/min    Comment: (NOTE) Calculated using the CKD-EPI Creatinine Equation (2021)    Anion gap 12 5 - 15    Comment: Performed at [facility] [address] [place], [place] [address]  Vancomycin, peak     Status: None   Collection Time: 12/02/21  4:36 AM  Result Value Ref Range   Vancomycin Pk 38 30 - 40 ug/mL    Comment: Performed at [facility], [place] [address], [place],  [address]    MR HIP RIGHT WO CONTRAST  Result Date: 11/30/2021 CLINICAL DATA:  Hip pain, infection suspected, xray done MRSA bacteremia EXAM: MR OF THE RIGHT HIP WITHOUT CONTRAST TECHNIQUE: Multiplanar, multisequence  MR imaging was performed. No intravenous contrast was administered. COMPARISON:  X-ray 11/26/2021.  CT 12/18/2018 and 01/17/2017 FINDINGS: Bones/Joint/Cartilage Severe, end-stage degenerative changes of the bilateral hip joints with complete joint space loss, bulky marginal osteophytes, and extensive subchondral cyst formation along both sides of the joint. There is osseous remodeling of both the femoral heads and  bilateral acetabula. Patchy bone marrow edema throughout the bilateral femoral head and neck regions as well as within the bilateral acetabula, likely reactive and degenerative. No evidence of acute fracture. No dislocation. Small-moderate-sized right and trace left joint effusions, likely reactive. Bony pelvis intact without fracture or diastasis. Mild discogenic endplate marrow changes at L4-5 and L5-S1. No findings to suggest discitis within the visualized lumbar spine. Ligaments Intact. Muscles and Tendons Intramuscular edema within the bilateral iliacus musculature and within the adductor compartments of the bilateral hips, right worse than left. No acute tendon tear. Soft tissues No extra-articular fluid collections. Trace free fluid within the pelvis, nonspecific. IMPRESSION: 1. Severe, end-stage degenerative changes of the bilateral hip joints. 2. Small-moderate-sized right hip joint effusion, likely degenerative/reactive. Although felt to be less likely, septic arthritis is not entirely excluded. 3. Intramuscular edema within the bilateral iliacus musculature and within the adductor compartments of the bilateral hips, right worse than left. Electronically Signed   By: Davina Poke D.O.   On: 11/30/2021 15:56   DG FLUORO GUIDED NEEDLE PLC ASPIRATION/INJECTION LOC  Result Date: 12/02/2021 CLINICAL DATA:  Hip pain, hip aspiration EXAM: RIGHT HIP INJECTION UNDER FLUOROSCOPY COMPARISON:  MRI 11/30/2021 FLUOROSCOPY TIME:  Fluoroscopy Time:  12 seconds Radiation Exposure Index (if provided by the  fluoroscopic device): 0.3 mGy Number of Acquired Spot Images: 1 PROCEDURE: Scout fluoroscopic image demonstrates severe right hip osteoarthritis with articular surface destruction in productive bony change. There is superolateral positioning of the femoral head with respect to the acetabulum. Overlying skin prepped with Betadine, draped in the usual sterile fashion, and infiltrated locally with buffered Lidocaine. An 18 gauge spinal needle advanced to the inferior medial margin of the right femoral neck and aspiration performed. Approximately 1 mL of bloody fluid was aspirated from the right hip joint. No immediate complication. IMPRESSION: Successful fluoroscopic guided right hip aspiration. Approximately 1 mL of bloody fluid was aspirated from the right hip joint and sent to the laboratory for analysis. Electronically Signed   By: Maurine Simmering M.D.   On: 12/02/2021 09:49    Review of Systems  HENT:  Negative for ear discharge, ear pain, hearing loss and tinnitus.   Eyes:  Negative for photophobia and pain.  Respiratory:  Negative for cough and shortness of breath.   Cardiovascular:  Negative for chest pain.  Gastrointestinal:  Negative for abdominal pain, nausea and vomiting.  Genitourinary:  Negative for dysuria, flank pain, frequency and urgency.  Musculoskeletal:  Positive for arthralgias (Right hip). Negative for back pain, myalgias and neck pain.  Neurological:  Negative for dizziness and headaches.  Hematological:  Does not bruise/bleed easily.  Psychiatric/Behavioral:  The patient is not nervous/anxious.   Blood pressure (!) 122/56, pulse 72, temperature 98.2 F (36.8 C), temperature source Oral, resp. rate 17, height '5\' 2"'  (1.575 m), weight 44.8 kg, SpO2 98 %. Physical Exam Constitutional:      General: She is not in acute distress.    Appearance: She is well-developed. She is not diaphoretic.  HENT:     Head: Normocephalic and atraumatic.  Eyes:     General: No scleral icterus.        Right eye: No discharge.        Left eye: No discharge.     Conjunctiva/sclera: Conjunctivae normal.  Cardiovascular:     Rate and Rhythm: Normal rate and regular rhythm.  Pulmonary:     Effort: Pulmonary effort is normal. No respiratory distress.  Musculoskeletal:     Cervical  back: Normal range of motion.     Comments: RLE No traumatic wounds, ecchymosis, or rash  Nontender, mild pain with AROM/PROM  No knee or ankle effusion  Knee stable to varus/ valgus and anterior/posterior stress  Sens DPN, SPN, TN intact  Motor EHL, ext, flex, evers 5/5  DP 2+, PT 2+, No significant edema  Skin:    General: Skin is warm and dry.  Neurological:     Mental Status: She is alert.  Psychiatric:        Mood and Affect: Mood normal.        Behavior: Behavior normal.    Assessment/Plan: Right hip pain -- She has advanced OA and would benefit from a THA though would need to be healthy and clean for at least 6 months. Doubt septic arthritis but will await results of fluid analysis.    Lisette Abu, PA-C Orthopedic Surgery [phone] 12/02/2021, 10:31 AM

## 2021-12-02 NOTE — Progress Notes (Addendum)
Pharmacy Antibiotic Note  Sherry Baker is a 56 y.o. female admitted on 11/25/2021 with bacteremia.  Pharmacy has been consulted for Vanco dosing.  ID: Cellulitis w/ MRSA bacteremia - ID following  - Rash is not monkeypox per ID - TTE - no vegetations.  - varicella zoster swab neg   1/30 AM update:  AUC supra-therapeutic at 592 (goal 400-550)  Plan: Change vancomycin to 750 mg IV q48h Re-check levels as needed  Height: 5\' 2"  (157.5 cm) Weight: 44.8 kg (98 lb 12.3 oz) IBW/kg (Calculated) : 50.1  Temp (24hrs), Avg:99 F (37.2 C), Min:98.6 F (37 C), Max:99.3 F (37.4 C)  Recent Labs  Lab 11/27/21 0133 11/28/21 0105 11/29/21 0247 11/30/21 0120 12/01/21 0153 12/02/21 0026 12/02/21 0436  WBC 7.2 5.4 4.6 4.4 5.1  --   --   CREATININE 1.45* 1.36* 1.38* 1.43* 1.51*  --  1.31*  VANCOTROUGH  --   --   --   --   --  13*  --   VANCOPEAK  --   --   --   --   --   --  38     Estimated Creatinine Clearance: 34.7 mL/min (A) (by C-G formula based on SCr of 1.31 mg/dL (H)).    Allergies  Allergen Reactions   Penicillins Hives and Swelling    Has patient had a PCN reaction causing immediate rash, facial/tongue/throat swelling, SOB or lightheadedness with hypotension: Yes Has patient had a PCN reaction causing severe rash involving mucus membranes or skin necrosis: No Has patient had a PCN reaction that required hospitalization No Has patient had a PCN reaction occurring within the last 10 years: Yes If all of the above answers are "NO", then may proceed with Cephalosporin use.     Narda Bonds, PharmD, BCPS Clinical Pharmacist Phone: [phone]

## 2021-12-03 DIAGNOSIS — L89159 Pressure ulcer of sacral region, unspecified stage: Secondary | ICD-10-CM | POA: Diagnosis not present

## 2021-12-03 DIAGNOSIS — G8929 Other chronic pain: Secondary | ICD-10-CM

## 2021-12-03 DIAGNOSIS — L03114 Cellulitis of left upper limb: Secondary | ICD-10-CM | POA: Diagnosis not present

## 2021-12-03 DIAGNOSIS — M25552 Pain in left hip: Secondary | ICD-10-CM

## 2021-12-03 LAB — BASIC METABOLIC PANEL
Anion gap: 11 (ref 5–15)
BUN: 18 mg/dL (ref 6–20)
CO2: 22 mmol/L (ref 22–32)
Calcium: 9.2 mg/dL (ref 8.9–10.3)
Chloride: 101 mmol/L (ref 98–111)
Creatinine, Ser: 1.29 mg/dL — ABNORMAL HIGH (ref 0.44–1.00)
GFR, Estimated: 49 mL/min — ABNORMAL LOW (ref >60)
Glucose, Bld: 108 mg/dL — ABNORMAL HIGH (ref 70–99)
Potassium: 4.1 mmol/L (ref 3.5–5.1)
Sodium: 134 mmol/L — ABNORMAL LOW (ref 135–145)

## 2021-12-03 LAB — CBC WITH DIFFERENTIAL/PLATELET
Abs Immature Granulocytes: 0 10*3/uL (ref 0.00–0.07)
Basophils Absolute: 0 10*3/uL (ref 0.0–0.1)
Basophils Relative: 0 %
Eosinophils Absolute: 0.3 10*3/uL (ref 0.0–0.5)
Eosinophils Relative: 5 %
HCT: 28.2 % — ABNORMAL LOW (ref 36.0–46.0)
Hemoglobin: 9.1 g/dL — ABNORMAL LOW (ref 12.0–15.0)
Lymphocytes Relative: 15 %
Lymphs Abs: 0.8 10*3/uL (ref 0.7–4.0)
MCH: 28.3 pg (ref 26.0–34.0)
MCHC: 32.3 g/dL (ref 30.0–36.0)
MCV: 87.6 fL (ref 80.0–100.0)
Monocytes Absolute: 0.3 10*3/uL (ref 0.1–1.0)
Monocytes Relative: 6 %
Neutro Abs: 4.1 10*3/uL (ref 1.7–7.7)
Neutrophils Relative %: 74 %
Platelets: 375 10*3/uL (ref 150–400)
RBC: 3.22 MIL/uL — ABNORMAL LOW (ref 3.87–5.11)
RDW: 16.8 % — ABNORMAL HIGH (ref 11.5–15.5)
WBC: 5.6 10*3/uL (ref 4.0–10.5)
nRBC: 0 % (ref 0.0–0.2)
nRBC: 0 /100 WBC

## 2021-12-03 LAB — MAGNESIUM: Magnesium: 1.9 mg/dL (ref 1.7–2.4)

## 2021-12-03 NOTE — Progress Notes (Signed)
[facility] for Infectious Disease    Date of Admission:  11/25/2021   Total days of antibiotics 10          ID: Sherry Baker is a 56 y.o. female with disseminated MRSA infection Principal Problem:   Cellulitis of left hand Active Problems:   Severe malnutrition (HCC)   Anemia   Hypertension   Polysubstance abuse (HCC)   Pustular rash   Hyponatremia   CKD (chronic kidney disease), stage III ([name])   Colostomy in place [facility])   MRSA bacteremia   Right hip pain    Subjective: Afebrile. Left hand feels better still has hip pain.  Medications:   amLODipine  5 mg Oral Daily   busPIRone  15 mg Oral BID   carvedilol  3.125 mg Oral BID WC   Chlorhexidine Gluconate Cloth  6 each Topical Q0600   citalopram  10 mg Oral Daily   diclofenac Sodium  2 g Topical QID   enoxaparin (LOVENOX) injection  30 mg Subcutaneous R67E   folic acid  1 mg Oral Daily   gabapentin  200 mg Oral TID   mupirocin ointment   Nasal BID   pantoprazole  40 mg Oral Daily   senna-docusate  2 tablet Oral QHS   thiamine  100 mg Oral Daily    Objective: Vital signs in last 24 hours: Temp:  [98.1 F (36.7 C)-100.2 F (37.9 C)] 99.2 F (37.3 C) (01/31 1125) Pulse Rate:  [68-78] 78 (01/31 1125) Resp:  [16-18] 18 (01/31 1125) BP: (108-127)/(68-70) 127/68 (01/31 1125) SpO2:  [95 %-98 %] 98 % (01/31 1125) Weight:  [44.8 kg] 44.8 kg (01/31 0624) Physical Exam  Constitutional:  oriented to person, place, and time. appears well-developed and well-nourished. No distress.  HENT: [place]/AT, PERRLA, no scleral icterus Mouth/Throat: Oropharynx is clear and moist. No oropharyngeal exudate.  Cardiovascular: Normal rate, regular rhythm and normal heart sounds. Exam reveals no gallop and no friction rub.  No murmur heard.  Pulmonary/Chest: Effort normal and breath sounds normal. No respiratory distress.  has no wheezes.  Neck = supple, no nuchal rigidity Abdominal: Soft. Bowel sounds are normal.  exhibits no  distension. There is no tenderness.  Lymphadenopathy: no cervical adenopathy. No axillary adenopathy Neurological: alert and oriented to person, place, and time.  Back =sacral ulcer still present Skin: Skin is warm and dry. Improvement of skin lesions to back. Resolution of cellulitis to left hand Psychiatric: a normal mood and affect.  behavior is normal.    Lab Results Recent Labs    12/01/21 0153 12/02/21 0436 12/03/21 0104  WBC 5.1  --  5.6  HGB 7.8*  --  9.1*  HCT 24.4*  --  28.2*  NA 132* 133* 134*  K 3.5 3.9 4.1  CL 102 102 101  CO2 20* 19* 22  BUN 19 18 18   CREATININE 1.51* 1.31* 1.29*     Microbiology: reviewed Studies/Results: DG FLUORO GUIDED NEEDLE PLC ASPIRATION/INJECTION LOC  Result Date: 12/02/2021 CLINICAL DATA:  Hip pain, hip aspiration EXAM: RIGHT HIP INJECTION UNDER FLUOROSCOPY COMPARISON:  MRI 11/30/2021 FLUOROSCOPY TIME:  Fluoroscopy Time:  12 seconds Radiation Exposure Index (if provided by the fluoroscopic device): 0.3 mGy Number of Acquired Spot Images: 1 PROCEDURE: Scout fluoroscopic image demonstrates severe right hip osteoarthritis with articular surface destruction in productive bony change. There is superolateral positioning of the femoral head with respect to the acetabulum. Overlying skin prepped with Betadine, draped in the usual sterile fashion, and infiltrated  locally with buffered Lidocaine. An 18 gauge spinal needle advanced to the inferior medial margin of the right femoral neck and aspiration performed. Approximately 1 mL of bloody fluid was aspirated from the right hip joint. No immediate complication. IMPRESSION: Successful fluoroscopic guided right hip aspiration. Approximately 1 mL of bloody fluid was aspirated from the right hip joint and sent to the laboratory for analysis. Electronically Signed   By: Maurine Simmering M.D.   On: 12/02/2021 09:49     Assessment/Plan: MRSA bacteremia = adjusted vanco dosing since renal function is  improving Endocarditis ruled out. Planning to treat as complicated bacteremia with 4 wks of treatment  Mrsa skin lesions/cellulitis to left hand/forearm = improving. Continue with vancomycin  Left hip pain = arthrocentesis did see elevated neutrophils but cell count unable to be done due to clumping. Culture is negative though it may remain so since she had been on iv abtx x 8 days at time of aspiration. Can continue to observe to see if improvement of pain with ongoing abtx treatment  Sacral ulcer= continue with wound care  Severe protein-calorie malnutrition = continue to education and encourage improved dietary intake.  [facility] for Infectious Diseases Pager: [phone]  12/03/2021, 3:36 PM

## 2021-12-03 NOTE — Progress Notes (Signed)
PROGRESS NOTE    Sherry Baker  UDJ:[id number] DOB: 01-17-1966 DOA: 11/25/2021 PCP: Pcp, No   Brief Narrative:  56 y.o. female with medical history significant for polysubstance abuse, including cocaine use as well as alcohol abuse, type I bipolar disorder, central hypertension, generalized anxiety disorder, stage IIIb chronic kidney disease with associated baseline creatinine 1.3-1.6, chronic anemia with baseline hemoglobin 10-12, who was admitted to [facility] on 11/18/2021 with acute metabolic encephalopathy after presenting from home to [facility] ED for evaluation of altered mental status.  Recently hospitalized from December 22 to January 4 for altered mental status.  At that time also she was on Precedex.  She went into respiratory failure had to be intubated.  Supposed to discharge to skilled nursing facility which the patient eventually declined and went home.  Presented back on 1/16 with altered mental status.  Again placed on Precedex infusion in the ICU.  Was found to have maculopapular rash which became pustular.  Etiology was unclear.  Left AMA from the hospital on 11/23/2021.  Patient return to the ER due to swelling of her hand.  There was also concern that the rash on her back could be from monkey pox.  Patient was hospitalized for further management.  Subsequently noted to have MRSA bacteremia.  Infectious disease was consulted.  S/p TEE which was negative Slow to improve with continued intermittent fevers.  Assessment & Plan:   Principal Problem:   Cellulitis of left hand Active Problems:   Severe malnutrition (HCC)   Anemia   Hypertension   Polysubstance abuse (HCC)   Pustular rash   Hyponatremia   CKD (chronic kidney disease), stage III (HCC)   Colostomy in place [facility])   MRSA bacteremia   Right hip pain  Cellulitis of left hand/MRSA bacteremia/history of IVDU- (present on admission).  MRSA bacteremia likely secondary to IVDU. Seen by orthopedics.  Plan is to continue  with the antibiotic treatment.  Pain controlled. -warm soaks. Also seen by ID and they are managing antibiotics.  Currently she is on vancomycin. Echocardiogram does not show any obvious vegetations.   -TEE negative -repeat BC NGTD   Right hip pain- (present on admission) -x ray negative. Mri suspicious for possible septic joint though known OA also ddx.  Ortho on board.  Underwent aspiration, fluid analysis so far argues against infection.  Ortho recommends THA however also recommends that she needs to be clean for 6 months and healthier.   Colostomy in place [facility]) Seen by ostomy nurse.   CKD (chronic kidney disease), stage III ([place])- (present on admission): Renal function at baseline.   Hyponatremia: Mild and improving.  Pustular rash- (present on admission) - pustular rash.  Differential diagnosis does include monkeypox.  Seen by ID and they feel that this is most likely secondary to MRSA.  Continue contact precautions.  Lesions appear to be improving.    Polysubstance abuse ([place])- (present on admission) -known history of IV drug use and polysubstance abuse.   -Urine drug screen positive for benzodiazepine and cocaine -continue to encourage cessation    GERD (gastroesophageal reflux disease) Chronic.  Continue PPI.   Hypertension- (present on admission): Blood pressure controlled.  Continue amlodipine and Coreg.   Anemia- (present on admission) Chronic for the most part.   -No overt bleeding noted.  Continue to monitor. -transfuse for <7     Severe malnutrition ([place])- (present on admission) Encourage oral intake.    DVT prophylaxis: enoxaparin (LOVENOX) injection 30 mg Start: 11/25/21 1600  SCDs Start: 11/25/21 0434   Code Status: Full Code  Family Communication:  None present at bedside.  Plan of care discussed with patient in length and he/she verbalized understanding and agreed with it.  Status is: Inpatient  Remains inpatient appropriate because: Needs IV antibiotics  and further work-up of MRSA bacteremia.  Estimated body mass index is 18.06 kg/m as calculated from the following:   Height as of this encounter: 5\' 2"  (1.575 m).   Weight as of this encounter: 44.8 kg.  Pressure Injury 10/25/21 Sacrum Medial Stage 3 -  Full thickness tissue loss. Subcutaneous fat may be visible but bone, tendon or muscle are NOT exposed. (Active)  10/25/21 1421  Location: Sacrum  Location Orientation: Medial  Staging: Stage 3 -  Full thickness tissue loss. Subcutaneous fat may be visible but bone, tendon or muscle are NOT exposed.  Wound Description (Comments):   Present on Admission: Yes   Nutritional Assessment: Body mass index is 18.06 kg/m.Marland Kitchen Seen by dietician.  I agree with the assessment and plan as outlined below: Nutrition Status:   . Skin Assessment: I have examined the patient's skin and I agree with the wound assessment as performed by the wound care RN as outlined below: Pressure Injury 10/25/21 Sacrum Medial Stage 3 -  Full thickness tissue loss. Subcutaneous fat may be visible but bone, tendon or muscle are NOT exposed. (Active)  10/25/21 1421  Location: Sacrum  Location Orientation: Medial  Staging: Stage 3 -  Full thickness tissue loss. Subcutaneous fat may be visible but bone, tendon or muscle are NOT exposed.  Wound Description (Comments):   Present on Admission: Yes    Consultants:  Orthopedics ID  Procedures:  As above  Antimicrobials:  Anti-infectives (From admission, onward)    Start     Dose/Rate Route Frequency Ordered Stop   12/03/21 1000  vancomycin (VANCOREADY) IVPB 750 mg/150 mL        750 mg 150 mL/hr over 60 Minutes Intravenous Every 48 hours 12/02/21 0556     11/30/21 0000  vancomycin (VANCOREADY) IVPB 1250 mg/250 mL  Status:  Discontinued        1,250 mg 166.7 mL/hr over 90 Minutes Intravenous Every 48 hours 11/29/21 0933 11/29/21 0940   11/30/21 0000  vancomycin (VANCOREADY) IVPB 500 mg/100 mL  Status:  Discontinued         500 mg 100 mL/hr over 60 Minutes Intravenous Every 24 hours 11/29/21 0940 12/02/21 0556   11/28/21 1200  vancomycin (VANCOREADY) IVPB 750 mg/150 mL  Status:  Discontinued        750 mg 150 mL/hr over 60 Minutes Intravenous Every 36 hours 11/27/21 0917 11/27/21 0919   11/28/21 0000  vancomycin (VANCOREADY) IVPB 500 mg/100 mL  Status:  Discontinued        500 mg 100 mL/hr over 60 Minutes Intravenous Every 24 hours 11/27/21 0919 11/29/21 0933   11/27/21 0000  vancomycin (VANCOREADY) IVPB 750 mg/150 mL  Status:  Discontinued        750 mg 150 mL/hr over 60 Minutes Intravenous Every 48 hours 11/25/21 0422 11/27/21 0917   11/26/21 0400  cefTRIAXone (ROCEPHIN) 1 g in sodium chloride 0.9 % 100 mL IVPB  Status:  Discontinued        1 g 200 mL/hr over 30 Minutes Intravenous Every 24 hours 11/25/21 0434 11/25/21 1149   11/25/21 0430  vancomycin (VANCOCIN) IVPB 1000 mg/200 mL premix        1,000 mg 200 mL/hr over  60 Minutes Intravenous  Once 11/25/21 0422 11/25/21 0552   11/25/21 0315  cefTRIAXone (ROCEPHIN) 2 g in sodium chloride 0.9 % 100 mL IVPB        2 g 200 mL/hr over 30 Minutes Intravenous  Once 11/25/21 0311 11/25/21 0435         Subjective: Patient seen and examined.  She complains of right hip pain.  No other complaint.  Objective: Vitals:   12/02/21 0820 12/02/21 1639 12/02/21 2249 12/03/21 0624  BP: (!) 122/56 127/70 108/70 123/69  Pulse: 72 73 74 68  Resp: 17 16 17 17   Temp: 98.2 F (36.8 C) 98.1 F (36.7 C) 100.2 F (37.9 C) 99 F (37.2 C)  TempSrc: Oral Oral Oral Oral  SpO2: 98% 98% 95% 97%  Weight:    44.8 kg  Height:        Intake/Output Summary (Last 24 hours) at 12/03/2021 1030 Last data filed at 12/02/2021 2253 Gross per 24 hour  Intake 460 ml  Output --  Net 460 ml   Filed Weights   12/01/21 0500 12/02/21 0255 12/03/21 0624  Weight: 45 kg 44.8 kg 44.8 kg    Examination:  General exam: Appears calm and comfortable, appears weak Respiratory  system: Clear to auscultation. Respiratory effort normal. Cardiovascular system: S1 & S2 heard, RRR. No JVD, murmurs, rubs, gallops or clicks. No pedal edema. Gastrointestinal system: Abdomen is nondistended, soft and nontender. No organomegaly or masses felt. Normal bowel sounds heard. Central nervous system: Alert and oriented. No focal neurological deficits. Extremities: Symmetric 5 x 5 power. Skin: No rashes, lesions or ulcers.  Psychiatry: Judgement and insight appear poor  Data Reviewed: I have personally reviewed following labs and imaging studies  CBC: Recent Labs  Lab 11/28/21 0105 11/29/21 0247 11/30/21 0120 12/01/21 0153 12/03/21 0104  WBC 5.4 4.6 4.4 5.1 5.6  NEUTROABS  --   --   --   --  4.1  HGB 7.9* 7.3* 7.6* 7.8* 9.1*  HCT 23.8* 22.1* 24.0* 24.4* 28.2*  MCV 86.9 85.3 87.6 87.5 87.6  PLT 180 199 200 262 027    Basic Metabolic Panel: Recent Labs  Lab 11/29/21 0247 11/30/21 0120 12/01/21 0153 12/02/21 0436 12/03/21 0104  NA 127* 131* 132* 133* 134*  K 3.4* 3.8 3.5 3.9 4.1  CL 98 102 102 102 101  CO2 20* 20* 20* 19* 22  GLUCOSE 101* 95 91 90 108*  BUN 12 16 19 18 18   CREATININE 1.38* 1.43* 1.51* 1.31* 1.29*  CALCIUM 8.5* 8.6* 8.7* 8.8* 9.2  MG  --   --   --   --  1.9    GFR: Estimated Creatinine Clearance: 35.3 mL/min (A) (by C-G formula based on SCr of 1.29 mg/dL (H)). Liver Function Tests: No results for input(s): AST, ALT, ALKPHOS, BILITOT, PROT, ALBUMIN in the last 168 hours.  No results for input(s): LIPASE, AMYLASE in the last 168 hours. No results for input(s): AMMONIA in the last 168 hours. Coagulation Profile: No results for input(s): INR, PROTIME in the last 168 hours. Cardiac Enzymes: No results for input(s): CKTOTAL, CKMB, CKMBINDEX, TROPONINI in the last 168 hours. BNP (last 3 results) No results for input(s): PROBNP in the last 8760 hours. HbA1C: No results for input(s): HGBA1C in the last 72 hours. CBG: No results for input(s):  GLUCAP in the last 168 hours. Lipid Profile: No results for input(s): CHOL, HDL, LDLCALC, TRIG, CHOLHDL, LDLDIRECT in the last 72 hours. Thyroid Function Tests: No results for  input(s): TSH, T4TOTAL, FREET4, T3FREE, THYROIDAB in the last 72 hours. Anemia Panel: No results for input(s): VITAMINB12, FOLATE, FERRITIN, TIBC, IRON, RETICCTPCT in the last 72 hours. Sepsis Labs: No results for input(s): PROCALCITON, LATICACIDVEN in the last 168 hours.  Recent Results (from the past 240 hour(s))  Culture, blood (Routine x 2)     Status: Abnormal   Collection Time: 11/25/21  1:58 AM   Specimen: BLOOD  Result Value Ref Range Status   Specimen Description BLOOD RIGHT ANTECUBITAL  Final   Special Requests   Final    BOTTLES DRAWN AEROBIC AND ANAEROBIC Blood Culture adequate volume   Culture  Setup Time   Final    GRAM POSITIVE COCCI IN CLUSTERS IN BOTH AEROBIC AND ANAEROBIC BOTTLES Organism ID to follow CRITICAL RESULT CALLED TO, READ BACK BY AND VERIFIED WITH: J. FRENS PHARMD, AT [phone] 11/25/21 D. VANHOOK Performed at [facility], [place] [address] [place], [place] [address]    Culture METHICILLIN RESISTANT STAPHYLOCOCCUS AUREUS (A)  Final   Report Status 11/27/2021 FINAL  Final   Organism ID, Bacteria METHICILLIN RESISTANT STAPHYLOCOCCUS AUREUS  Final      Susceptibility   Methicillin resistant staphylococcus aureus - MIC*    CIPROFLOXACIN >=8 RESISTANT Resistant     ERYTHROMYCIN >=8 RESISTANT Resistant     GENTAMICIN <=0.5 SENSITIVE Sensitive     OXACILLIN >=4 RESISTANT Resistant     TETRACYCLINE <=1 SENSITIVE Sensitive     VANCOMYCIN <=0.5 SENSITIVE Sensitive     TRIMETH/SULFA >=320 RESISTANT Resistant     CLINDAMYCIN <=0.25 SENSITIVE Sensitive     RIFAMPIN <=0.5 SENSITIVE Sensitive     Inducible Clindamycin NEGATIVE Sensitive     * METHICILLIN RESISTANT STAPHYLOCOCCUS AUREUS  Blood Culture ID Panel (Reflexed)     Status: Abnormal   Collection Time: 11/25/21  1:58 AM  Result  Value Ref Range Status   Enterococcus faecalis NOT DETECTED NOT DETECTED Final   Enterococcus Faecium NOT DETECTED NOT DETECTED Final   Listeria monocytogenes NOT DETECTED NOT DETECTED Final   Staphylococcus species DETECTED (A) NOT DETECTED Final    Comment: CRITICAL RESULT CALLED TO, READ BACK BY AND VERIFIED WITH: J. FRENS PHARMD, AT [id number] 11/25/21 D. VANHOOK    Staphylococcus aureus (BCID) DETECTED (A) NOT DETECTED Final    Comment: Methicillin (oxacillin)-resistant Staphylococcus aureus (MRSA). MRSA is predictably resistant to beta-lactam antibiotics (except ceftaroline). Preferred therapy is vancomycin unless clinically contraindicated. Patient requires contact precautions if  hospitalized. CRITICAL RESULT CALLED TO, READ BACK BY AND VERIFIED WITH: J. FRENS PHARMD, AT 3825 11/25/21 D. VANHOOK    Staphylococcus epidermidis NOT DETECTED NOT DETECTED Final   Staphylococcus lugdunensis NOT DETECTED NOT DETECTED Final   Streptococcus species NOT DETECTED NOT DETECTED Final   Streptococcus agalactiae NOT DETECTED NOT DETECTED Final   Streptococcus pneumoniae NOT DETECTED NOT DETECTED Final   Streptococcus pyogenes NOT DETECTED NOT DETECTED Final   A.calcoaceticus-baumannii NOT DETECTED NOT DETECTED Final   Bacteroides fragilis NOT DETECTED NOT DETECTED Final   Enterobacterales NOT DETECTED NOT DETECTED Final   Enterobacter cloacae complex NOT DETECTED NOT DETECTED Final   Escherichia coli NOT DETECTED NOT DETECTED Final   Klebsiella aerogenes NOT DETECTED NOT DETECTED Final   Klebsiella oxytoca NOT DETECTED NOT DETECTED Final   Klebsiella pneumoniae NOT DETECTED NOT DETECTED Final   Proteus species NOT DETECTED NOT DETECTED Final   Salmonella species NOT DETECTED NOT DETECTED Final   Serratia marcescens NOT DETECTED NOT DETECTED Final   Haemophilus influenzae  NOT DETECTED NOT DETECTED Final   Neisseria meningitidis NOT DETECTED NOT DETECTED Final   Pseudomonas aeruginosa NOT DETECTED  NOT DETECTED Final   Stenotrophomonas maltophilia NOT DETECTED NOT DETECTED Final   Candida albicans NOT DETECTED NOT DETECTED Final   Candida auris NOT DETECTED NOT DETECTED Final   Candida glabrata NOT DETECTED NOT DETECTED Final   Candida krusei NOT DETECTED NOT DETECTED Final   Candida parapsilosis NOT DETECTED NOT DETECTED Final   Candida tropicalis NOT DETECTED NOT DETECTED Final   Cryptococcus neoformans/gattii NOT DETECTED NOT DETECTED Final   Meth resistant mecA/C and MREJ DETECTED (A) NOT DETECTED Final    Comment: CRITICAL RESULT CALLED TO, READ BACK BY AND VERIFIED WITH: J. FRENS PHARMD, AT 5597 11/25/21 D. VANHOOK Performed at [facility], [place] [address] [place], [place] [address]   Culture, blood (Routine x 2)     Status: Abnormal   Collection Time: 11/25/21  2:05 AM   Specimen: BLOOD RIGHT HAND  Result Value Ref Range Status   Specimen Description BLOOD RIGHT HAND  Final   Special Requests   Final    BOTTLES DRAWN AEROBIC AND ANAEROBIC Blood Culture adequate volume   Culture  Setup Time   Final    GRAM POSITIVE COCCI IN BOTH AEROBIC AND ANAEROBIC BOTTLES CRITICAL VALUE NOTED.  VALUE IS CONSISTENT WITH PREVIOUSLY REPORTED AND CALLED VALUE.    Culture (A)  Final    STAPHYLOCOCCUS AUREUS SUSCEPTIBILITIES PERFORMED ON PREVIOUS CULTURE WITHIN THE LAST 5 DAYS. Performed at [facility], [place] [address] [place], [place] [address]    Report Status 11/27/2021 FINAL  Final  Resp Panel by RT-PCR (Flu A&B, Covid) Nasopharyngeal Swab     Status: None   Collection Time: 11/25/21  4:20 AM   Specimen: Nasopharyngeal Swab; Nasopharyngeal(NP) swabs in vial transport medium  Result Value Ref Range Status   SARS Coronavirus 2 by RT PCR NEGATIVE NEGATIVE Final    Comment: (NOTE) SARS-CoV-2 target nucleic acids are NOT DETECTED.  The SARS-CoV-2 RNA is generally detectable in upper respiratory specimens during the acute phase of infection. The lowest concentration of  SARS-CoV-2 viral copies this assay can detect is 138 copies/mL. A negative result does not preclude SARS-Cov-2 infection and should not be used as the sole basis for treatment or other patient management decisions. A negative result may occur with  improper specimen collection/handling, submission of specimen other than nasopharyngeal swab, presence of viral mutation(s) within the areas targeted by this assay, and inadequate number of viral copies(<138 copies/mL). A negative result must be combined with clinical observations, patient history, and epidemiological information. The expected result is Negative.  Fact Sheet for Patients:  EntrepreneurPulse.com.au  Fact Sheet for Healthcare Providers:  IncredibleEmployment.be  This test is no t yet approved or cleared by the [place] FDA and  has been authorized for detection and/or diagnosis of SARS-CoV-2 by FDA under an Emergency Use Authorization (EUA). This EUA will remain  in effect (meaning this test can be used) for the duration of the COVID-19 declaration under Section 564(b)(1) of the Act, 21 U.S.C.section 360bbb-3(b)(1), unless the authorization is terminated  or revoked sooner.       Influenza A by PCR NEGATIVE NEGATIVE Final   Influenza B by PCR NEGATIVE NEGATIVE Final    Comment: (NOTE) The Xpert Xpress SARS-CoV-2/FLU/RSV plus assay is intended as an aid in the diagnosis of influenza from Nasopharyngeal swab specimens and should not be used as a sole basis for  treatment. Nasal washings and aspirates are unacceptable for Xpert Xpress SARS-CoV-2/FLU/RSV testing.  Fact Sheet for Patients: EntrepreneurPulse.com.au  Fact Sheet for Healthcare Providers: IncredibleEmployment.be  This test is not yet approved or cleared by the [place] FDA and has been authorized for detection and/or diagnosis of SARS-CoV-2 by FDA under an Emergency Use  Authorization (EUA). This EUA will remain in effect (meaning this test can be used) for the duration of the COVID-19 declaration under Section 564(b)(1) of the Act, 21 U.S.C. section 360bbb-3(b)(1), unless the authorization is terminated or revoked.  Performed at [facility], [place] [address] [place], [place] [address]   Varicella-zoster by PCR (Blood or Swab)     Status: None   Collection Time: 11/25/21  9:54 AM   Specimen: Sterile Swab  Result Value Ref Range Status   Varicella-Zoster, PCR Negative Negative Final    Comment: (NOTE) No Varicella Zoster Virus DNA detected. This test was developed and its performance characteristics determined by [facility].  It has not been cleared or approved by the Food and Drug Administration.  The FDA has determined that such clearance or approval is not necessary. Performed At: [place], [place] [id number] Rush Farmer MD NT:[id number]   MRSA Next Gen by PCR, Nasal     Status: Abnormal   Collection Time: 11/25/21  2:00 PM  Result Value Ref Range Status   MRSA by PCR Next Gen DETECTED (A) NOT DETECTED Final    Comment: MELAINIE FLORES RN 11/25/21 @2009  BY JW (NOTE) The GeneXpert MRSA Assay (FDA approved for NASAL specimens only), is one component of a comprehensive MRSA colonization surveillance program. It is not intended to diagnose MRSA infection nor to guide or monitor treatment for MRSA infections. Test performance is not FDA approved in patients less than 7 years old. Performed at [facility], [place] [address] [place], [place] [address]   Culture, blood (Routine X 2) w Reflex to ID Panel     Status: None   Collection Time: 11/27/21  9:54 AM   Specimen: BLOOD LEFT HAND  Result Value Ref Range Status   Specimen Description BLOOD LEFT HAND  Final   Special Requests   Final    BOTTLES DRAWN AEROBIC AND ANAEROBIC Blood Culture adequate volume   Culture   Final    NO GROWTH 5  DAYS Performed at [facility], [place] [address] [place], [place] [address]    Report Status 12/02/2021 FINAL  Final  Culture, blood (Routine X 2) w Reflex to ID Panel     Status: None   Collection Time: 11/27/21  9:54 AM   Specimen: BLOOD RIGHT HAND  Result Value Ref Range Status   Specimen Description BLOOD RIGHT HAND  Final   Special Requests   Final    BOTTLES DRAWN AEROBIC AND ANAEROBIC Blood Culture adequate volume   Culture   Final    NO GROWTH 5 DAYS Performed at [facility], [place] [address], [place],  [address]    Report Status 12/02/2021 FINAL  Final  Body fluid culture w Gram Stain     Status: None (Preliminary result)   Collection Time: 12/02/21  9:16 AM   Specimen: PATH Cytology Misc. fluid; Body Fluid  Result Value Ref Range Status   Specimen Description SYNOVIAL  Final   Special Requests RIGHT HIP  Final   Gram Stain NO WBC SEEN NO ORGANISMS SEEN   Final   Culture  Final    NO GROWTH < 24 HOURS Performed at [facility], 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 There is superolateral positioning of the femoral head with respect to the acetabulum. Overlying skin prepped with Betadine, draped in the usual sterile fashion, and infiltrated locally with buffered Lidocaine. An 18 gauge spinal needle advanced to the inferior medial margin of the right femoral neck and aspiration performed. Approximately  1 mL of bloody fluid was aspirated from the right hip joint. No immediate complication. IMPRESSION: Successful fluoroscopic guided right hip aspiration. Approximately 1 mL of bloody fluid was aspirated from the right hip joint and sent to the laboratory for analysis. Electronically Signed   By: Maurine Simmering M.D.   On: 12/02/2021 09:49    Scheduled Meds:  amLODipine  5 mg Oral Daily   busPIRone  15 mg Oral BID   carvedilol  3.125 mg Oral BID WC   Chlorhexidine Gluconate Cloth  6 each Topical Q0600   citalopram  10 mg Oral Daily   diclofenac Sodium  2 g Topical QID   enoxaparin (LOVENOX) injection  30 mg Subcutaneous L79G   folic acid  1 mg Oral Daily   gabapentin  200 mg Oral TID   mupirocin ointment   Nasal BID   pantoprazole  40 mg Oral Daily   senna-docusate  2 tablet Oral QHS   thiamine  100 mg Oral Daily   Continuous Infusions:  vancomycin 750 mg (12/03/21 0950)     LOS: 8 days   Time spent: 27 minutes  Darliss Cheney, MD [facility]  12/03/2021, 10:30 AM  Please page via [facility] and do not message via secure chat for urgent patient care matters. Secure chat can be used for non urgent patient care matters.  How to contact the [facility] Attending or Consulting provider [place] or covering provider during after hours [place], for this patient?  Check the care team in [facility] and look for a) attending/consulting [facility] provider listed and b) the [facility] team listed. Page or secure chat [phone]. Log into [email or web address] and use [place]'s universal password to access. If you do not have the password, please contact the hospital operator. Locate the [facility] provider you are looking for under [facility] and page to a number that you can be directly reached. If you still have difficulty reaching the provider, please page the [facility] (Director on Call) for the Hospitalists listed on amion for assistance.

## 2021-12-04 DIAGNOSIS — L03114 Cellulitis of left upper limb: Secondary | ICD-10-CM | POA: Diagnosis not present

## 2021-12-04 DIAGNOSIS — L89159 Pressure ulcer of sacral region, unspecified stage: Secondary | ICD-10-CM | POA: Diagnosis not present

## 2021-12-04 DIAGNOSIS — M25552 Pain in left hip: Secondary | ICD-10-CM | POA: Diagnosis not present

## 2021-12-04 DIAGNOSIS — G8929 Other chronic pain: Secondary | ICD-10-CM | POA: Diagnosis not present

## 2021-12-04 LAB — BASIC METABOLIC PANEL
Anion gap: 8 (ref 5–15)
BUN: 18 mg/dL (ref 6–20)
CO2: 22 mmol/L (ref 22–32)
Calcium: 9 mg/dL (ref 8.9–10.3)
Chloride: 104 mmol/L (ref 98–111)
Creatinine, Ser: 1.5 mg/dL — ABNORMAL HIGH (ref 0.44–1.00)
GFR, Estimated: 41 mL/min — ABNORMAL LOW (ref >60)
Glucose, Bld: 104 mg/dL — ABNORMAL HIGH (ref 70–99)
Potassium: 4.6 mmol/L (ref 3.5–5.1)
Sodium: 134 mmol/L — ABNORMAL LOW (ref 135–145)

## 2021-12-04 MED ORDER — ENSURE ENLIVE PO LIQD
237.0000 mL | Freq: Three times a day (TID) | ORAL | Status: DC
  Administered 2021-12-04 – 2021-12-15 (×31): 237 mL via ORAL

## 2021-12-04 MED ORDER — ADULT MULTIVITAMIN W/MINERALS CH
1.0000 | ORAL_TABLET | Freq: Every day | ORAL | Status: DC
  Administered 2021-12-04 – 2021-12-15 (×12): 1 via ORAL
  Filled 2021-12-04 (×12): qty 1

## 2021-12-04 NOTE — Progress Notes (Signed)
PROGRESS NOTE    Sherry Baker  LAG:[id number] DOB: Mar 27, 1966 DOA: 11/25/2021 PCP: Pcp, No   Brief Narrative:  56 y.o. female with medical history significant for polysubstance abuse, including cocaine use as well as alcohol abuse, type I bipolar disorder, central hypertension, generalized anxiety disorder, stage IIIb chronic kidney disease with associated baseline creatinine 1.3-1.6, chronic anemia with baseline hemoglobin 10-12, who was admitted to [facility] on 11/18/2021 with acute metabolic encephalopathy after presenting from home to [facility] ED for evaluation of altered mental status.  Recently hospitalized from December 22 to January 4 for altered mental status.  At that time also she was on Precedex.  She went into respiratory failure had to be intubated.  Supposed to discharge to skilled nursing facility which the patient eventually declined and went home.  Presented back on 1/16 with altered mental status.  Again placed on Precedex infusion in the ICU.  Was found to have maculopapular rash which became pustular.  Etiology was unclear.  Left AMA from the hospital on 11/23/2021.  Patient return to the ER due to swelling of her hand.  There was also concern that the rash on her back could be from monkey pox.  Patient was hospitalized for further management.  Subsequently noted to have MRSA bacteremia.  Infectious disease was consulted.  S/p TEE which was negative Slow to improve with continued intermittent fevers.  Assessment & Plan:   Principal Problem:   Cellulitis of left hand Active Problems:   Severe malnutrition (HCC)   Anemia   Hypertension   Polysubstance abuse (HCC)   Pustular rash   Hyponatremia   CKD (chronic kidney disease), stage III (HCC)   Colostomy in place [facility])   MRSA bacteremia   Right hip pain  Cellulitis of left hand/MRSA bacteremia/history of IVDU- (present on admission).  MRSA bacteremia likely secondary to IVDU. Seen by orthopedics.  Plan is to continue  with the antibiotic treatment.  Pain controlled. -warm soaks. Also seen by ID and they are managing antibiotics.  Currently she is on vancomycin.  Per ID, patient will need IV vancomycin at least through 12/14/2021 and then they will think about possible oral options.  Unfortunately, due to history of IVDU, patient cannot be discharged with PICC line.  Needs to stay in the hospital to complete at least 10 days of IV vancomycin, patient agreeable with this plan.  Echocardiogram does not show any obvious vegetations.   -TEE negative -repeat BC NGTD   Right hip pain- (present on admission) -x ray negative. Mri suspicious for possible septic joint though known OA also ddx.  Ortho on board.  Underwent aspiration, fluid analysis so far argues against infection.  Ortho recommends THA however also recommends that she needs to be clean for 6 months and healthier.   Colostomy in place [facility]) Seen by ostomy nurse.   CKD (chronic kidney disease), stage III ([place])- (present on admission): Renal function at baseline.   Hyponatremia: Mild and improving.  Pustular rash- (present on admission) - pustular rash.  Differential diagnosis does include monkeypox.  Seen by ID and they feel that this is most likely secondary to MRSA.  Continue contact precautions.  Lesions appear to be improving.    Polysubstance abuse ([place])- (present on admission) -known history of IV drug use and polysubstance abuse.   -Urine drug screen positive for benzodiazepine and cocaine -continue to encourage cessation    GERD (gastroesophageal reflux disease) Chronic.  Continue PPI.   Hypertension- (present on admission): Blood  pressure controlled.  Continue amlodipine and Coreg.   Anemia- (present on admission) Chronic for the most part.   -No overt bleeding noted.  Continue to monitor. -transfuse for <7     Severe malnutrition ([place])- (present on admission) Encourage oral intake.    DVT prophylaxis: enoxaparin (LOVENOX) injection  30 mg Start: 11/25/21 1600 SCDs Start: 11/25/21 0434   Code Status: Full Code  Family Communication:  None present at bedside.  Plan of care discussed with patient in length and he/she verbalized understanding and agreed with it.  Status is: Inpatient  Remains inpatient appropriate because: Needs IV antibiotics and further work-up of MRSA bacteremia.  Estimated body mass index is 17.76 kg/m as calculated from the following:   Height as of this encounter: 5\' 2"  (1.575 m).   Weight as of this encounter: 44 kg.  Pressure Injury 10/25/21 Sacrum Medial Stage 3 -  Full thickness tissue loss. Subcutaneous fat may be visible but bone, tendon or muscle are NOT exposed. (Active)  10/25/21 1421  Location: Sacrum  Location Orientation: Medial  Staging: Stage 3 -  Full thickness tissue loss. Subcutaneous fat may be visible but bone, tendon or muscle are NOT exposed.  Wound Description (Comments):   Present on Admission: Yes   Nutritional Assessment: Body mass index is 17.76 kg/m.Marland Kitchen Seen by dietician.  I agree with the assessment and plan as outlined below: Nutrition Status:   . Skin Assessment: I have examined the patient's skin and I agree with the wound assessment as performed by the wound care RN as outlined below: Pressure Injury 10/25/21 Sacrum Medial Stage 3 -  Full thickness tissue loss. Subcutaneous fat may be visible but bone, tendon or muscle are NOT exposed. (Active)  10/25/21 1421  Location: Sacrum  Location Orientation: Medial  Staging: Stage 3 -  Full thickness tissue loss. Subcutaneous fat may be visible but bone, tendon or muscle are NOT exposed.  Wound Description (Comments):   Present on Admission: Yes    Consultants:  Orthopedics ID  Procedures:  As above  Antimicrobials:  Anti-infectives (From admission, onward)    Start     Dose/Rate Route Frequency Ordered Stop   12/03/21 1000  vancomycin (VANCOREADY) IVPB 750 mg/150 mL        750 mg 150 mL/hr over 60  Minutes Intravenous Every 48 hours 12/02/21 0556     11/30/21 0000  vancomycin (VANCOREADY) IVPB 1250 mg/250 mL  Status:  Discontinued        1,250 mg 166.7 mL/hr over 90 Minutes Intravenous Every 48 hours 11/29/21 0933 11/29/21 0940   11/30/21 0000  vancomycin (VANCOREADY) IVPB 500 mg/100 mL  Status:  Discontinued        500 mg 100 mL/hr over 60 Minutes Intravenous Every 24 hours 11/29/21 0940 12/02/21 0556   11/28/21 1200  vancomycin (VANCOREADY) IVPB 750 mg/150 mL  Status:  Discontinued        750 mg 150 mL/hr over 60 Minutes Intravenous Every 36 hours 11/27/21 0917 11/27/21 0919   11/28/21 0000  vancomycin (VANCOREADY) IVPB 500 mg/100 mL  Status:  Discontinued        500 mg 100 mL/hr over 60 Minutes Intravenous Every 24 hours 11/27/21 0919 11/29/21 0933   11/27/21 0000  vancomycin (VANCOREADY) IVPB 750 mg/150 mL  Status:  Discontinued        750 mg 150 mL/hr over 60 Minutes Intravenous Every 48 hours 11/25/21 0422 11/27/21 0917   11/26/21 0400  cefTRIAXone (ROCEPHIN) 1 g in  sodium chloride 0.9 % 100 mL IVPB  Status:  Discontinued        1 g 200 mL/hr over 30 Minutes Intravenous Every 24 hours 11/25/21 0434 11/25/21 1149   11/25/21 0430  vancomycin (VANCOCIN) IVPB 1000 mg/200 mL premix        1,000 mg 200 mL/hr over 60 Minutes Intravenous  Once 11/25/21 0422 11/25/21 0552   11/25/21 0315  cefTRIAXone (ROCEPHIN) 2 g in sodium chloride 0.9 % 100 mL IVPB        2 g 200 mL/hr over 30 Minutes Intravenous  Once 11/25/21 0311 11/25/21 0435         Subjective: Seen and examined.  No new complaint other than right hip pain which is improving as well.  Objective: Vitals:   12/03/21 2104 12/03/21 2257 12/04/21 0455 12/04/21 0758  BP: 133/72  130/83 132/82  Pulse: 72  81 76  Resp: 17  18   Temp: 99.5 F (37.5 C)  98.2 F (36.8 C)   TempSrc: Oral  Oral   SpO2: 96%  98%   Weight:  44 kg    Height:        Intake/Output Summary (Last 24 hours) at 12/04/2021 1007 Last data filed at  12/03/2021 1650 Gross per 24 hour  Intake 346.33 ml  Output 250 ml  Net 96.33 ml    Filed Weights   12/02/21 0255 12/03/21 0624 12/03/21 2257  Weight: 44.8 kg 44.8 kg 44 kg    Examination: General exam: Appears calm and comfortable, appears thin and cachectic Respiratory system: Clear to auscultation. Respiratory effort normal. Cardiovascular system: S1 & S2 heard, RRR. No JVD, murmurs, rubs, gallops or clicks. No pedal edema. Gastrointestinal system: Abdomen is nondistended, soft and nontender. No organomegaly or masses felt. Normal bowel sounds heard. Central nervous system: Alert and oriented. No focal neurological deficits. Extremities: Symmetric 5 x 5 power. Skin: No rashes, lesions or ulcers.  Psychiatry: Judgement and insight appear normal. Mood & affect appropriate.   Data Reviewed: I have personally reviewed following labs and imaging studies  CBC: Recent Labs  Lab 11/28/21 0105 11/29/21 0247 11/30/21 0120 12/01/21 0153 12/03/21 0104  WBC 5.4 4.6 4.4 5.1 5.6  NEUTROABS  --   --   --   --  4.1  HGB 7.9* 7.3* 7.6* 7.8* 9.1*  HCT 23.8* 22.1* 24.0* 24.4* 28.2*  MCV 86.9 85.3 87.6 87.5 87.6  PLT 180 199 200 262 948    Basic Metabolic Panel: Recent Labs  Lab 11/30/21 0120 12/01/21 0153 12/02/21 0436 12/03/21 0104 12/04/21 0151  NA 131* 132* 133* 134* 134*  K 3.8 3.5 3.9 4.1 4.6  CL 102 102 102 101 104  CO2 20* 20* 19* 22 22  GLUCOSE 95 91 90 108* 104*  BUN 16 19 18 18 18   CREATININE 1.43* 1.51* 1.31* 1.29* 1.50*  CALCIUM 8.6* 8.7* 8.8* 9.2 9.0  MG  --   --   --  1.9  --     GFR: Estimated Creatinine Clearance: 29.8 mL/min (A) (by C-G formula based on SCr of 1.5 mg/dL (H)). Liver Function Tests: No results for input(s): AST, ALT, ALKPHOS, BILITOT, PROT, ALBUMIN in the last 168 hours.  No results for input(s): LIPASE, AMYLASE in the last 168 hours. No results for input(s): AMMONIA in the last 168 hours. Coagulation Profile: No results for input(s):  INR, PROTIME in the last 168 hours. Cardiac Enzymes: No results for input(s): CKTOTAL, CKMB, CKMBINDEX, TROPONINI in the last 168  hours. BNP (last 3 results) No results for input(s): PROBNP in the last 8760 hours. HbA1C: No results for input(s): HGBA1C in the last 72 hours. CBG: No results for input(s): GLUCAP in the last 168 hours. Lipid Profile: No results for input(s): CHOL, HDL, LDLCALC, TRIG, CHOLHDL, LDLDIRECT in the last 72 hours. Thyroid Function Tests: No results for input(s): TSH, T4TOTAL, FREET4, T3FREE, THYROIDAB in the last 72 hours. Anemia Panel: No results for input(s): VITAMINB12, FOLATE, FERRITIN, TIBC, IRON, RETICCTPCT in the last 72 hours. Sepsis Labs: No results for input(s): PROCALCITON, LATICACIDVEN in the last 168 hours.  Recent Results (from the past 240 hour(s))  Culture, blood (Routine x 2)     Status: Abnormal   Collection Time: 11/25/21  1:58 AM   Specimen: BLOOD  Result Value Ref Range Status   Specimen Description BLOOD RIGHT ANTECUBITAL  Final   Special Requests   Final    BOTTLES DRAWN AEROBIC AND ANAEROBIC Blood Culture adequate volume   Culture  Setup Time   Final    GRAM POSITIVE COCCI IN CLUSTERS IN BOTH AEROBIC AND ANAEROBIC BOTTLES Organism ID to follow CRITICAL RESULT CALLED TO, READ BACK BY AND VERIFIED WITH: J. FRENS PHARMD, AT 1027 11/25/21 D. VANHOOK Performed at [facility], [place] [address], [place], [place] [address]    Culture METHICILLIN RESISTANT STAPHYLOCOCCUS AUREUS (A)  Final   Report Status 11/27/2021 FINAL  Final   Organism ID, Bacteria METHICILLIN RESISTANT STAPHYLOCOCCUS AUREUS  Final      Susceptibility   Methicillin resistant staphylococcus aureus - MIC*    CIPROFLOXACIN >=8 RESISTANT Resistant     ERYTHROMYCIN >=8 RESISTANT Resistant     GENTAMICIN <=0.5 SENSITIVE Sensitive     OXACILLIN >=4 RESISTANT Resistant     TETRACYCLINE <=1 SENSITIVE Sensitive     VANCOMYCIN <=0.5 SENSITIVE Sensitive     TRIMETH/SULFA  >=320 RESISTANT Resistant     CLINDAMYCIN <=0.25 SENSITIVE Sensitive     RIFAMPIN <=0.5 SENSITIVE Sensitive     Inducible Clindamycin NEGATIVE Sensitive     * METHICILLIN RESISTANT STAPHYLOCOCCUS AUREUS  Blood Culture ID Panel (Reflexed)     Status: Abnormal   Collection Time: 11/25/21  1:58 AM  Result Value Ref Range Status   Enterococcus faecalis NOT DETECTED NOT DETECTED Final   Enterococcus Faecium NOT DETECTED NOT DETECTED Final   Listeria monocytogenes NOT DETECTED NOT DETECTED Final   Staphylococcus species DETECTED (A) NOT DETECTED Final    Comment: CRITICAL RESULT CALLED TO, READ BACK BY AND VERIFIED WITH: J. FRENS PHARMD, AT 4403 11/25/21 D. VANHOOK    Staphylococcus aureus (BCID) DETECTED (A) NOT DETECTED Final    Comment: Methicillin (oxacillin)-resistant Staphylococcus aureus (MRSA). MRSA is predictably resistant to beta-lactam antibiotics (except ceftaroline). Preferred therapy is vancomycin unless clinically contraindicated. Patient requires contact precautions if  hospitalized. CRITICAL RESULT CALLED TO, READ BACK BY AND VERIFIED WITH: J. FRENS PHARMD, AT 4742 11/25/21 D. VANHOOK    Staphylococcus epidermidis NOT DETECTED NOT DETECTED Final   Staphylococcus lugdunensis NOT DETECTED NOT DETECTED Final   Streptococcus species NOT DETECTED NOT DETECTED Final   Streptococcus agalactiae NOT DETECTED NOT DETECTED Final   Streptococcus pneumoniae NOT DETECTED NOT DETECTED Final   Streptococcus pyogenes NOT DETECTED NOT DETECTED Final   A.calcoaceticus-baumannii NOT DETECTED NOT DETECTED Final   Bacteroides fragilis NOT DETECTED NOT DETECTED Final   Enterobacterales NOT DETECTED NOT DETECTED Final   Enterobacter cloacae complex NOT DETECTED NOT DETECTED Final   Escherichia coli NOT DETECTED NOT  DETECTED Final   Klebsiella aerogenes NOT DETECTED NOT DETECTED Final   Klebsiella oxytoca NOT DETECTED NOT DETECTED Final   Klebsiella pneumoniae NOT DETECTED NOT DETECTED Final    Proteus species NOT DETECTED NOT DETECTED Final   Salmonella species NOT DETECTED NOT DETECTED Final   Serratia marcescens NOT DETECTED NOT DETECTED Final   Haemophilus influenzae NOT DETECTED NOT DETECTED Final   Neisseria meningitidis NOT DETECTED NOT DETECTED Final   Pseudomonas aeruginosa NOT DETECTED NOT DETECTED Final   Stenotrophomonas maltophilia NOT DETECTED NOT DETECTED Final   Candida albicans NOT DETECTED NOT DETECTED Final   Candida auris NOT DETECTED NOT DETECTED Final   Candida glabrata NOT DETECTED NOT DETECTED Final   Candida krusei NOT DETECTED NOT DETECTED Final   Candida parapsilosis NOT DETECTED NOT DETECTED Final   Candida tropicalis NOT DETECTED NOT DETECTED Final   Cryptococcus neoformans/gattii NOT DETECTED NOT DETECTED Final   Meth resistant mecA/C and MREJ DETECTED (A) NOT DETECTED Final    Comment: CRITICAL RESULT CALLED TO, READ BACK BY AND VERIFIED WITH: J. FRENS PHARMD, AT 1610 11/25/21 D. VANHOOK Performed at [facility], [place] [address] [place], [place] [address]   Culture, blood (Routine x 2)     Status: Abnormal   Collection Time: 11/25/21  2:05 AM   Specimen: BLOOD RIGHT HAND  Result Value Ref Range Status   Specimen Description BLOOD RIGHT HAND  Final   Special Requests   Final    BOTTLES DRAWN AEROBIC AND ANAEROBIC Blood Culture adequate volume   Culture  Setup Time   Final    GRAM POSITIVE COCCI IN BOTH AEROBIC AND ANAEROBIC BOTTLES CRITICAL VALUE NOTED.  VALUE IS CONSISTENT WITH PREVIOUSLY REPORTED AND CALLED VALUE.    Culture (A)  Final    STAPHYLOCOCCUS AUREUS SUSCEPTIBILITIES PERFORMED ON PREVIOUS CULTURE WITHIN THE LAST 5 DAYS. Performed at [facility], [place] [address] [place], [place] [address]    Report Status 11/27/2021 FINAL  Final  Resp Panel by RT-PCR (Flu A&B, Covid) Nasopharyngeal Swab     Status: None   Collection Time: 11/25/21  4:20 AM   Specimen: Nasopharyngeal Swab; Nasopharyngeal(NP) swabs in vial transport  medium  Result Value Ref Range Status   SARS Coronavirus 2 by RT PCR NEGATIVE NEGATIVE Final    Comment: (NOTE) SARS-CoV-2 target nucleic acids are NOT DETECTED.  The SARS-CoV-2 RNA is generally detectable in upper respiratory specimens during the acute phase of infection. The lowest concentration of SARS-CoV-2 viral copies this assay can detect is 138 copies/mL. A negative result does not preclude SARS-Cov-2 infection and should not be used as the sole basis for treatment or other patient management decisions. A negative result may occur with  improper specimen collection/handling, submission of specimen other than nasopharyngeal swab, presence of viral mutation(s) within the areas targeted by this assay, and inadequate number of viral copies(<138 copies/mL). A negative result must be combined with clinical observations, patient history, and epidemiological information. The expected result is Negative.  Fact Sheet for Patients:  EntrepreneurPulse.com.au  Fact Sheet for Healthcare Providers:  IncredibleEmployment.be  This test is no t yet approved or cleared by the [place] FDA and  has been authorized for detection and/or diagnosis of SARS-CoV-2 by FDA under an Emergency Use Authorization (EUA). This EUA will remain  in effect (meaning this test can be used) for the duration of the COVID-19 declaration under Section 564(b)(1) of the Act, 21 U.S.C.section 360bbb-3(b)(1), unless the authorization is terminated  or revoked sooner.       Influenza A by PCR NEGATIVE NEGATIVE Final   Influenza B by PCR NEGATIVE NEGATIVE Final    Comment: (NOTE) The Xpert Xpress SARS-CoV-2/FLU/RSV plus assay is intended as an aid in the diagnosis of influenza from Nasopharyngeal swab specimens and should not be used as a sole basis for treatment. Nasal washings and aspirates are unacceptable for Xpert Xpress SARS-CoV-2/FLU/RSV testing.  Fact Sheet for  Patients: EntrepreneurPulse.com.au  Fact Sheet for Healthcare Providers: IncredibleEmployment.be  This test is not yet approved or cleared by the [place] FDA and has been authorized for detection and/or diagnosis of SARS-CoV-2 by FDA under an Emergency Use Authorization (EUA). This EUA will remain in effect (meaning this test can be used) for the duration of the COVID-19 declaration under Section 564(b)(1) of the Act, 21 U.S.C. section 360bbb-3(b)(1), unless the authorization is terminated or revoked.  Performed at [facility], [place] [address] [place], [place] [address]   Varicella-zoster by PCR (Blood or Swab)     Status: None   Collection Time: 11/25/21  9:54 AM   Specimen: Sterile Swab  Result Value Ref Range Status   Varicella-Zoster, PCR Negative Negative Final    Comment: (NOTE) No Varicella Zoster Virus DNA detected. This test was developed and its performance characteristics determined by [facility].  It has not been cleared or approved by the Food and Drug Administration.  The FDA has determined that such clearance or approval is not necessary. Performed At: [facility] [place], [place] [id number] Rush Farmer MD KK:[id number]   MRSA Next Gen by PCR, Nasal     Status: Abnormal   Collection Time: 11/25/21  2:00 PM  Result Value Ref Range Status   MRSA by PCR Next Gen DETECTED (A) NOT DETECTED Final    Comment: MELAINIE FLORES RN 11/25/21 @2009  BY JW (NOTE) The GeneXpert MRSA Assay (FDA approved for NASAL specimens only), is one component of a comprehensive MRSA colonization surveillance program. It is not intended to diagnose MRSA infection nor to guide or monitor treatment for MRSA infections. Test performance is not FDA approved in patients less than 44 years old. Performed at [facility], [place] [address], [place], [place] [address]   Culture, blood (Routine X 2) w Reflex to ID Panel      Status: None   Collection Time: 11/27/21  9:54 AM   Specimen: BLOOD LEFT HAND  Result Value Ref Range Status   Specimen Description BLOOD LEFT HAND  Final   Special Requests   Final    BOTTLES DRAWN AEROBIC AND ANAEROBIC Blood Culture adequate volume   Culture   Final    NO GROWTH 5 DAYS Performed at [facility], [place] [address], [place], [place] [address]    Report Status 12/02/2021 FINAL  Final  Culture, blood (Routine X 2) w Reflex to ID Panel     Status: None   Collection Time: 11/27/21  9:54 AM   Specimen: BLOOD RIGHT HAND  Result Value Ref Range Status   Specimen Description BLOOD RIGHT HAND  Final   Special Requests   Final    BOTTLES DRAWN AEROBIC AND ANAEROBIC Blood Culture adequate volume   Culture   Final    NO GROWTH 5 DAYS Performed at [facility], [place] [address], [place], [place] [address]    Report Status 12/02/2021 FINAL  Final  Body fluid culture w Gram Stain  Status: None (Preliminary result)   Collection Time: 12/02/21  9:16 AM   Specimen: PATH Cytology Misc. fluid; Body Fluid  Result Value Ref Range Status   Specimen Description SYNOVIAL  Final   Special Requests RIGHT HIP  Final   Gram Stain NO WBC SEEN NO ORGANISMS SEEN   Final   Culture   Final    NO GROWTH 2 DAYS Performed at [facility], [address], [place], [place] [address]    Report Status PENDING  Incomplete      Radiology Studies: No results found.  Scheduled Meds:  amLODipine  5 mg Oral Daily   busPIRone  15 mg Oral BID   carvedilol  3.125 mg Oral BID WC   Chlorhexidine Gluconate Cloth  6 each Topical Q0600   citalopram  10 mg Oral Daily   diclofenac Sodium  2 g Topical QID   enoxaparin (LOVENOX) injection  30 mg Subcutaneous Q25Z   folic acid  1 mg Oral Daily   gabapentin  200 mg Oral TID   mupirocin ointment   Nasal BID   pantoprazole  40 mg Oral Daily   senna-docusate  2 tablet Oral QHS   thiamine  100 mg Oral Daily   Continuous Infusions:   vancomycin 750 mg (12/03/21 0950)     LOS: 9 days   Time spent: 25 minutes  Darliss Cheney, MD [facility]  12/04/2021, 10:07 AM  Please page via [facility] and do not message via secure chat for urgent patient care matters. Secure chat can be used for non urgent patient care matters.  How to contact the [facility] Attending or Consulting provider [place] or covering provider during after hours [place], for this patient?  Check the care team in [facility] and look for a) attending/consulting [facility] provider listed and b) the [facility] team listed. Page or secure chat [phone]. Log into [email or web address] and use [place] universal password to access. If you do not have the password, please contact the hospital operator. Locate the [facility] provider you are looking for under [facility] and page to a number that you can be directly reached. If you still have difficulty reaching the provider, please page the [facility] (Director on Call) for the Hospitalists listed on amion for assistance.

## 2021-12-04 NOTE — Progress Notes (Signed)
°    [facility] for Infectious Disease    Date of Admission:  11/25/2021   Total days of antibiotics11           ID: Sherry Baker is a 56 y.o. female with MRSA complicated bacteremia with left arm SSTI, MRSA pustules to torso +/- septic hip Principal Problem:   Cellulitis of left hand Active Problems:   Severe malnutrition (HCC)   Anemia   Hypertension   Polysubstance abuse (HCC)   Pustular rash   Hyponatremia   CKD (chronic kidney disease), stage III ([place])   Colostomy in place [facility])   MRSA bacteremia   Right hip pain    Subjective: Afebrile. Feeling better. Improvement in right hip pain  Medications:   amLODipine  5 mg Oral Daily   busPIRone  15 mg Oral BID   carvedilol  3.125 mg Oral BID WC   Chlorhexidine Gluconate Cloth  6 each Topical Q0600   citalopram  10 mg Oral Daily   diclofenac Sodium  2 g Topical QID   enoxaparin (LOVENOX) injection  30 mg Subcutaneous A35T   folic acid  1 mg Oral Daily   gabapentin  200 mg Oral TID   mupirocin ointment   Nasal BID   pantoprazole  40 mg Oral Daily   senna-docusate  2 tablet Oral QHS   thiamine  100 mg Oral Daily    Objective: Vital signs in last 24 hours: Temp:  [98.2 F (36.8 C)-99.5 F (37.5 C)] 99 F (37.2 C) (02/01 1111) Pulse Rate:  [60-81] 60 (02/01 1111) Resp:  [17-18] 18 (02/01 1111) BP: (130-150)/(62-83) 150/78 (02/01 1111) SpO2:  [96 %-100 %] 100 % (02/01 1111) Weight:  [44 kg] 44 kg (01/31 2257) Physical Exam  Constitutional:  oriented to person, place, and time. appears well-developed and well-nourished. No distress.  HENT: [place]/AT, PERRLA, no scleral icterus Mouth/Throat: Oropharynx is clear and moist. No oropharyngeal exudate.  Cardiovascular: Normal rate, regular rhythm and normal heart sounds. Exam reveals no gallop and no friction rub.  No murmur heard.  Pulmonary/Chest: Effort normal and breath sounds normal. No respiratory distress.  has no wheezes.  Neck = supple, no nuchal  rigidity Abdominal: Soft. Bowel sounds are normal.  exhibits no distension. There is no tenderness.  Lymphadenopathy: no cervical adenopathy. No axillary adenopathy Neurological: alert and oriented to person, place, and time.  Skin: Skin is warm and dry. Lesion to rash is improving. Sacral Pressure ulcer covered. No excess drainage Psychiatric: a normal mood and affect.  behavior is normal.    Lab Results Recent Labs    12/03/21 0104 12/04/21 0151  WBC 5.6  --   HGB 9.1*  --   HCT 28.2*  --   NA 134* 134*  K 4.1 4.6  CL 101 104  CO2 22 22  BUN 18 18  CREATININE 1.29* 1.50*     Microbiology: 1/23 blood cx MRSA 1/25 blood cx NGTD Studies/Results: No results found.   Assessment/Plan: MRSA complicated bacteremia - will check sed rate and crp - continue with IV therapy for additional 10 days then will plan to convert to orals  Right hip OA +/- septic arthritis = will follow up on cultures  CKD 3= stable. Will continue to monitor to ensure to minimize vanco toxicity  [facility] for Infectious Diseases Pager: [phone]  12/04/2021, 2:34 PM

## 2021-12-04 NOTE — Progress Notes (Signed)
Initial Nutrition Assessment  DOCUMENTATION CODES:  Underweight, Severe malnutrition in context of social or environmental circumstances  INTERVENTION:  Continue current diet as ordered, encourage PO intake MVI with minerals, continue thiamine and folic acid Ensure Enlive po TID, each supplement provides 350 kcal and 20 grams of protein.  NUTRITION DIAGNOSIS:  Severe Malnutrition (in the context of social/environmental circumstances) related to  (lack of nutritient dense foods with polysubstance abuse) as evidenced by severe fat depletion, severe muscle depletion.  GOAL:  Patient will meet greater than or equal to 90% of their needs  MONITOR:  Supplement acceptance, PO intake, Labs, Weight trends  REASON FOR ASSESSMENT:  LOS, Other (Comment) (low BMI)    ASSESSMENT:  56 yo female admitted with hx of cocaine use, alcohol abuse, Bipolar disorder, HTN, GAD, CKD stage IIIB, chronic anemia, liver cirrhosis, rectal cancer 2018 s/p colostomy placement, and malnutrition admitted with swelling of her hand after leaving AMA the day prior (1/21)  On admission, Urine drug screen positive for benzodiazepine and cocaine. Pt found to have cellulitis of her left hand and MRSA bacteremia.  1/26 - TEE (no vegetation, normal EF)  Pt well known to nutrition team, has been followed in the past during previous admissions and has a hx of needing cortrak tube placement.   Pt resting in bed at the time of assessment. States her appetite has been fair since being readmitted, did not eat lunch today as she was napping. Discussed restarting her ensure supplements, pt enthusiastic, reports she likes chocolate best.   Average Meal Intake: 1/24-1/31: 48% average intake x 6 recorded meals  Nutritionally Relevant Medications: Scheduled Meds:  folic acid  1 mg Oral Daily   pantoprazole  40 mg Oral Daily   senna-docusate  2 tablet Oral QHS   thiamine  100 mg Oral Daily   Continuous Infusions:  vancomycin  750 mg (12/03/21 0950)   PRN Meds: ondansetron, polyethylene glycol  Labs Reviewed: Sodium 134  Creatinine 1.5 SBG ranges from 104-108 mg/dL over the last 24 hours  NUTRITION - FOCUSED PHYSICAL EXAM: Flowsheet Row Most Recent Value  Orbital Region Severe depletion  Upper Arm Region Severe depletion  Thoracic and Lumbar Region Severe depletion  Buccal Region Severe depletion  Temple Region Severe depletion  Clavicle Bone Region Severe depletion  Clavicle and Acromion Bone Region Severe depletion  Scapular Bone Region Severe depletion  Dorsal Hand Severe depletion  Patellar Region Severe depletion  Anterior Thigh Region Severe depletion  Posterior Calf Region Severe depletion  Edema (RD Assessment) None  Hair Reviewed  Eyes Reviewed  Mouth Reviewed  [poor dentition]  Skin Reviewed  Nails Reviewed   Diet Order:   Diet Order             Diet regular Room service appropriate? Yes; Fluid consistency: Thin  Diet effective now                   EDUCATION NEEDS:  No education needs have been identified at this time  Skin:  Skin Assessment: Reviewed RN Assessment (stage 3 to the sacrum)  Last BM:  1/31 via colostomy  Height:  Ht Readings from Last 1 Encounters:  11/25/21 5\' 2"  (1.575 m)   Weight:  Wt Readings from Last 1 Encounters:  12/03/21 44 kg    Ideal Body Weight:  50 kg  BMI:  Body mass index is 17.76 kg/m.  Estimated Nutritional Needs:  Kcal:  1800-2000 kcal/d Protein:  90-100g/d Fluid:  1.8-2 L/d  Ranell Patrick, RD, LDN Clinical Dietitian RD pager # available in [place]  After hours/weekend pager # available in [facility]

## 2021-12-05 DIAGNOSIS — L03114 Cellulitis of left upper limb: Secondary | ICD-10-CM | POA: Diagnosis not present

## 2021-12-05 DIAGNOSIS — G8929 Other chronic pain: Secondary | ICD-10-CM | POA: Diagnosis not present

## 2021-12-05 DIAGNOSIS — L89159 Pressure ulcer of sacral region, unspecified stage: Secondary | ICD-10-CM | POA: Diagnosis not present

## 2021-12-05 DIAGNOSIS — M25552 Pain in left hip: Secondary | ICD-10-CM | POA: Diagnosis not present

## 2021-12-05 LAB — BASIC METABOLIC PANEL
Anion gap: 11 (ref 5–15)
BUN: 20 mg/dL (ref 6–20)
CO2: 21 mmol/L — ABNORMAL LOW (ref 22–32)
Calcium: 9.3 mg/dL (ref 8.9–10.3)
Chloride: 102 mmol/L (ref 98–111)
Creatinine, Ser: 1.24 mg/dL — ABNORMAL HIGH (ref 0.44–1.00)
GFR, Estimated: 52 mL/min — ABNORMAL LOW (ref >60)
Glucose, Bld: 106 mg/dL — ABNORMAL HIGH (ref 70–99)
Potassium: 4.2 mmol/L (ref 3.5–5.1)
Sodium: 134 mmol/L — ABNORMAL LOW (ref 135–145)

## 2021-12-05 LAB — BODY FLUID CULTURE W GRAM STAIN
Culture: NO GROWTH
Gram Stain: NONE SEEN

## 2021-12-05 MED ORDER — HYDROCORTISONE 1 % EX CREA
1.0000 "application " | TOPICAL_CREAM | Freq: Three times a day (TID) | CUTANEOUS | Status: DC | PRN
  Administered 2021-12-08 – 2021-12-10 (×2): 1 via TOPICAL
  Filled 2021-12-05: qty 28

## 2021-12-05 NOTE — Progress Notes (Signed)
[facility] for Infectious Disease    Date of Admission:  11/25/2021      ID: Sherry Baker is a 56 y.o. female with complicated MRSA bacteremia, possible septic arthritis Principal Problem:   Cellulitis of left hand Active Problems:   Severe malnutrition (HCC)   Anemia   Hypertension   Polysubstance abuse (HCC)   Pustular rash   Hyponatremia   CKD (chronic kidney disease), stage III (HCC)   Colostomy in place [facility])   MRSA bacteremia   Right hip pain    Subjective: Afebrile, improved oral intake. Less pain to right hip  Medications:   amLODipine  5 mg Oral Daily   busPIRone  15 mg Oral BID   carvedilol  3.125 mg Oral BID WC   Chlorhexidine Gluconate Cloth  6 each Topical Q0600   citalopram  10 mg Oral Daily   diclofenac Sodium  2 g Topical QID   enoxaparin (LOVENOX) injection  30 mg Subcutaneous Q24H   feeding supplement  237 mL Oral TID BM   folic acid  1 mg Oral Daily   gabapentin  200 mg Oral TID   multivitamin with minerals  1 tablet Oral Daily   mupirocin ointment   Nasal BID   pantoprazole  40 mg Oral Daily   senna-docusate  2 tablet Oral QHS   thiamine  100 mg Oral Daily    Objective: Vital signs in last 24 hours: Temp:  [98.2 F (36.8 C)-99.9 F (37.7 C)] 98.6 F (37 C) (02/02 1600) Pulse Rate:  [74-81] 79 (02/02 1600) Resp:  [17-18] 18 (02/02 1600) BP: (104-142)/(63-80) 104/63 (02/02 1600) SpO2:  [96 %-99 %] 98 % (02/02 1600) Weight:  [43 kg] 43 kg (02/02 0220)  Physical Exam  Constitutional:  oriented to person, place, and time. appears well-developed and well-nourished. No distress.  HENT: [place]/AT, PERRLA, no scleral icterus Mouth/Throat: Oropharynx is clear and moist. No oropharyngeal exudate.  Cardiovascular: Normal rate, regular rhythm and normal heart sounds. Exam reveals no gallop and no friction rub.  No murmur heard.  Pulmonary/Chest: Effort normal and breath sounds normal. No respiratory distress.  has no wheezes.  Neck = supple,  no nuchal rigidity Abdominal: Soft. Bowel sounds are normal.  exhibits no distension. There is no tenderness.  Lymphadenopathy: no cervical adenopathy. No axillary adenopathy Neurological: alert and oriented to person, place, and time.  Skin: Skin is warm and dry. Improved left hand cellulitis and back lesions. Pressure sacral ulcer covered Psychiatric: a normal mood and affect.  behavior is normal.    Lab Results Recent Labs    12/03/21 0104 12/04/21 0151 12/05/21 0104  WBC 5.6  --   --   HGB 9.1*  --   --   HCT 28.2*  --   --   NA 134* 134* 134*  K 4.1 4.6 4.2  CL 101 104 102  CO2 22 22 21*  BUN 18 18 20   CREATININE 1.29* 1.50* 1.24*   Liver Panel No results for input(s): PROT, ALBUMIN, AST, ALT, ALKPHOS, BILITOT, BILIDIR, IBILI in the last 72 hours. Sedimentation Rate No results for input(s): ESRSEDRATE in the last 72 hours. C-Reactive Protein No results for input(s): CRP in the last 72 hours.  Microbiology: reviewed Studies/Results: No results found.   Assessment/Plan: Complicated MRSA bacteremia = continue on IV vancomycin. Recommend to do 4-6wk of which 3 wk can do as Iv therapy since last culture  Pressure ulcer= continue with dressing changes and improved nutrition  Deconditioning= continue with pt/ot  [facility] for Infectious Diseases Pager: [phone]  12/05/2021, 4:31 PM

## 2021-12-05 NOTE — Progress Notes (Signed)
Pharmacy Antibiotic Note  Sherry Baker is a 56 y.o. female admitted on 07/04/5175 with complicated MRSA bacteremia.  Pharmacy has been consulted for vancomycin dosing. TEE negative. SCr stable in 1.2-1.5 range.  Vancomycin dose last adjusted to q48h regimen on 1/30 due to supratherapeutic AUC. ID planning 10 additional days of IV therapy (through at least 2/11) and then transition to oral regimen.  Plan: Continue vancomycin 750 mg IV q48h. Goal AUC 400-550. Expected AUC: 444 Monitor clinical progress, c/s, renal function Recheck vancomycin levels at steady state F/u further ID plans   Height: 5\' 2"  (157.5 cm) Weight: 43 kg (94 lb 12.8 oz) (taken by NT) IBW/kg (Calculated) : 50.1  Temp (24hrs), Avg:99 F (37.2 C), Min:98.2 F (36.8 C), Max:99.9 F (37.7 C)  Recent Labs  Lab 11/29/21 0247 11/30/21 0120 12/01/21 0153 12/02/21 0026 12/02/21 0436 12/03/21 0104 12/04/21 0151 12/05/21 0104  WBC 4.6 4.4 5.1  --   --  5.6  --   --   CREATININE 1.38* 1.43* 1.51*  --  1.31* 1.29* 1.50* 1.24*  VANCOTROUGH  --   --   --  13*  --   --   --   --   VANCOPEAK  --   --   --   --  38  --   --   --      Estimated Creatinine Clearance: 35.2 mL/min (A) (by C-G formula based on SCr of 1.24 mg/dL (H)).    Allergies  Allergen Reactions   Penicillins Hives and Swelling    Has patient had a PCN reaction causing immediate rash, facial/tongue/throat swelling, SOB or lightheadedness with hypotension: Yes Has patient had a PCN reaction causing severe rash involving mucus membranes or skin necrosis: No Has patient had a PCN reaction that required hospitalization No Has patient had a PCN reaction occurring within the last 10 years: Yes If all of the above answers are "NO", then may proceed with Cephalosporin use.     Arturo Morton, PharmD, BCPS Please check AMION for all [place] contact numbers Clinical Pharmacist 12/05/2021 10:00 AM

## 2021-12-05 NOTE — Progress Notes (Signed)
PROGRESS NOTE    Sherry Baker  XNA:[id number] DOB: 1965-11-20 DOA: 11/25/2021 PCP: Pcp, No   Brief Narrative:  56 y.o. female with medical history significant for polysubstance abuse, including cocaine use as well as alcohol abuse, type I bipolar disorder, central hypertension, generalized anxiety disorder, stage IIIb chronic kidney disease with associated baseline creatinine 1.3-1.6, chronic anemia with baseline hemoglobin 10-12, who was admitted to [facility] on 11/18/2021 with acute metabolic encephalopathy after presenting from home to [facility] ED for evaluation of altered mental status.  Recently hospitalized from December 22 to January 4 for altered mental status.  At that time also she was on Precedex.  She went into respiratory failure had to be intubated.  Supposed to discharge to skilled nursing facility which the patient eventually declined and went home.  Presented back on 1/16 with altered mental status.  Again placed on Precedex infusion in the ICU.  Was found to have maculopapular rash which became pustular.  Etiology was unclear.  Left AMA from the hospital on 11/23/2021.  Patient return to the ER due to swelling of her hand.  There was also concern that the rash on her back could be from monkey pox.  Patient was hospitalized for further management.  Subsequently noted to have MRSA bacteremia.  Infectious disease was consulted.  S/p TEE which was negative Slow to improve with continued intermittent fevers.  Assessment & Plan:   Principal Problem:   Cellulitis of left hand Active Problems:   Severe malnutrition (HCC)   Anemia   Hypertension   Polysubstance abuse (HCC)   Pustular rash   Hyponatremia   CKD (chronic kidney disease), stage III (HCC)   Colostomy in place [facility])   MRSA bacteremia   Right hip pain  Cellulitis of left hand/MRSA bacteremia/history of IVDU- (present on admission).  MRSA bacteremia likely secondary to IVDU. Seen by orthopedics.  Plan is to continue  with the antibiotic treatment.  Pain controlled. -warm soaks. Also seen by ID and they are managing antibiotics.  Currently she is on vancomycin.  Per ID, patient will need IV vancomycin at least through 12/14/2021 and then they will think about possible oral options.  Unfortunately, due to history of IVDU, patient cannot be discharged with PICC line.  Needs to stay in the hospital to complete at least 10 days of IV vancomycin, patient agreeable with this plan.  Echocardiogram does not show any obvious vegetations.   -TEE negative -repeat BC NGTD   Right hip pain- (present on admission) -x ray negative. Mri suspicious for possible septic joint though known OA also ddx.  Ortho on board.  Underwent aspiration, fluid analysis so far argues against infection.  Ortho recommends THA however also recommends that she needs to be clean for 6 months and healthier.   Colostomy in place [facility]) Seen by ostomy nurse.   CKD (chronic kidney disease), stage III ([place])- (present on admission): Renal function at baseline.   Hyponatremia: Mild and improving.  Pustular rash- (present on admission) - pustular rash.  Seen by ID and they feel that this is most likely secondary to MRSA.  Continue contact precautions.  Rash resolved.   Polysubstance abuse ([place])- (present on admission) -known history of IV drug use and polysubstance abuse.   -Urine drug screen positive for benzodiazepine and cocaine -continue to encourage cessation    GERD (gastroesophageal reflux disease) Chronic.  Continue PPI.   Hypertension- (present on admission): Blood pressure controlled.  Continue amlodipine and Coreg.   Anemia- (  present on admission) Chronic for the most part.   -No overt bleeding noted.  Continue to monitor. -transfuse for <7     Severe malnutrition ([place])- (present on admission) Encourage oral intake.    DVT prophylaxis: enoxaparin (LOVENOX) injection 30 mg Start: 11/25/21 1600 SCDs Start: 11/25/21 0434   Code  Status: Full Code  Family Communication:  None present at bedside.  Plan of care discussed with patient in length and he/she verbalized understanding and agreed with it.  Status is: Inpatient  Remains inpatient appropriate because: Needs IV antibiotics, history of IVDU, cannot be discharged with IV line.  Estimated body mass index is 17.34 kg/m as calculated from the following:   Height as of this encounter: 5\' 2"  (1.575 m).   Weight as of this encounter: 43 kg.  Pressure Injury 10/25/21 Sacrum Medial Stage 3 -  Full thickness tissue loss. Subcutaneous fat may be visible but bone, tendon or muscle are NOT exposed. (Active)  10/25/21 1421  Location: Sacrum  Location Orientation: Medial  Staging: Stage 3 -  Full thickness tissue loss. Subcutaneous fat may be visible but bone, tendon or muscle are NOT exposed.  Wound Description (Comments):   Present on Admission: Yes   Nutritional Assessment: Body mass index is 17.34 kg/m.Marland Kitchen Seen by dietician.  I agree with the assessment and plan as outlined below: Nutrition Status: Nutrition Problem: Severe Malnutrition (in the context of social/environmental circumstances) Etiology:  (lack of nutritient dense foods with polysubstance abuse) . Skin Assessment: I have examined the patient's skin and I agree with the wound assessment as performed by the wound care RN as outlined below: Pressure Injury 10/25/21 Sacrum Medial Stage 3 -  Full thickness tissue loss. Subcutaneous fat may be visible but bone, tendon or muscle are NOT exposed. (Active)  10/25/21 1421  Location: Sacrum  Location Orientation: Medial  Staging: Stage 3 -  Full thickness tissue loss. Subcutaneous fat may be visible but bone, tendon or muscle are NOT exposed.  Wound Description (Comments):   Present on Admission: Yes    Consultants:  Orthopedics ID  Procedures:  As above  Antimicrobials:  Anti-infectives (From admission, onward)    Start     Dose/Rate Route Frequency  Ordered Stop   12/03/21 1000  vancomycin (VANCOREADY) IVPB 750 mg/150 mL        750 mg 150 mL/hr over 60 Minutes Intravenous Every 48 hours 12/02/21 0556     11/30/21 0000  vancomycin (VANCOREADY) IVPB 1250 mg/250 mL  Status:  Discontinued        1,250 mg 166.7 mL/hr over 90 Minutes Intravenous Every 48 hours 11/29/21 0933 11/29/21 0940   11/30/21 0000  vancomycin (VANCOREADY) IVPB 500 mg/100 mL  Status:  Discontinued        500 mg 100 mL/hr over 60 Minutes Intravenous Every 24 hours 11/29/21 0940 12/02/21 0556   11/28/21 1200  vancomycin (VANCOREADY) IVPB 750 mg/150 mL  Status:  Discontinued        750 mg 150 mL/hr over 60 Minutes Intravenous Every 36 hours 11/27/21 0917 11/27/21 0919   11/28/21 0000  vancomycin (VANCOREADY) IVPB 500 mg/100 mL  Status:  Discontinued        500 mg 100 mL/hr over 60 Minutes Intravenous Every 24 hours 11/27/21 0919 11/29/21 0933   11/27/21 0000  vancomycin (VANCOREADY) IVPB 750 mg/150 mL  Status:  Discontinued        750 mg 150 mL/hr over 60 Minutes Intravenous Every 48 hours 11/25/21 0422 11/27/21  0539   11/26/21 0400  cefTRIAXone (ROCEPHIN) 1 g in sodium chloride 0.9 % 100 mL IVPB  Status:  Discontinued        1 g 200 mL/hr over 30 Minutes Intravenous Every 24 hours 11/25/21 0434 11/25/21 1149   11/25/21 0430  vancomycin (VANCOCIN) IVPB 1000 mg/200 mL premix        1,000 mg 200 mL/hr over 60 Minutes Intravenous  Once 11/25/21 0422 11/25/21 0552   11/25/21 0315  cefTRIAXone (ROCEPHIN) 2 g in sodium chloride 0.9 % 100 mL IVPB        2 g 200 mL/hr over 30 Minutes Intravenous  Once 11/25/21 0311 11/25/21 0435         Subjective: Seen and examined.  No complaints.  Objective: Vitals:   12/05/21 0220 12/05/21 0327 12/05/21 0748 12/05/21 0828  BP:  (!) 141/78 (!) 142/68 126/75  Pulse:  81 78 79  Resp:  17  18  Temp:  98.8 F (37.1 C)  98.2 F (36.8 C)  TempSrc:  Oral  Oral  SpO2:  96%  98%  Weight: 43 kg     Height:        Intake/Output  Summary (Last 24 hours) at 12/05/2021 1059 Last data filed at 12/04/2021 2157 Gross per 24 hour  Intake 360 ml  Output 50 ml  Net 310 ml    Filed Weights   12/03/21 2257 12/04/21 2156 12/05/21 0220  Weight: 44 kg 43 kg 43 kg    Examination: General exam: Appears calm and comfortable, cachectic Respiratory system: Clear to auscultation. Respiratory effort normal. Cardiovascular system: S1 & S2 heard, RRR. No JVD, murmurs, rubs, gallops or clicks. No pedal edema. Gastrointestinal system: Abdomen is nondistended, soft and nontender. No organomegaly or masses felt. Normal bowel sounds heard. Central nervous system: Alert and oriented. No focal neurological deficits. Extremities: Symmetric 5 x 5 power. Skin: No rashes, lesions or ulcers.  Psychiatry: Judgement and insight appear normal. Mood & affect appropriate.   Data Reviewed: I have personally reviewed following labs and imaging studies  CBC: Recent Labs  Lab 11/29/21 0247 11/30/21 0120 12/01/21 0153 12/03/21 0104  WBC 4.6 4.4 5.1 5.6  NEUTROABS  --   --   --  4.1  HGB 7.3* 7.6* 7.8* 9.1*  HCT 22.1* 24.0* 24.4* 28.2*  MCV 85.3 87.6 87.5 87.6  PLT 199 200 262 767    Basic Metabolic Panel: Recent Labs  Lab 12/01/21 0153 12/02/21 0436 12/03/21 0104 12/04/21 0151 12/05/21 0104  NA 132* 133* 134* 134* 134*  K 3.5 3.9 4.1 4.6 4.2  CL 102 102 101 104 102  CO2 20* 19* 22 22 21*  GLUCOSE 91 90 108* 104* 106*  BUN 19 18 18 18 20   CREATININE 1.51* 1.31* 1.29* 1.50* 1.24*  CALCIUM 8.7* 8.8* 9.2 9.0 9.3  MG  --   --  1.9  --   --     GFR: Estimated Creatinine Clearance: 35.2 mL/min (A) (by C-G formula based on SCr of 1.24 mg/dL (H)). Liver Function Tests: No results for input(s): AST, ALT, ALKPHOS, BILITOT, PROT, ALBUMIN in the last 168 hours.  No results for input(s): LIPASE, AMYLASE in the last 168 hours. No results for input(s): AMMONIA in the last 168 hours. Coagulation Profile: No results for input(s): INR,  PROTIME in the last 168 hours. Cardiac Enzymes: No results for input(s): CKTOTAL, CKMB, CKMBINDEX, TROPONINI in the last 168 hours. BNP (last 3 results) No results for input(s): PROBNP in  the last 8760 hours. HbA1C: No results for input(s): HGBA1C in the last 72 hours. CBG: No results for input(s): GLUCAP in the last 168 hours. Lipid Profile: No results for input(s): CHOL, HDL, LDLCALC, TRIG, CHOLHDL, LDLDIRECT in the last 72 hours. Thyroid Function Tests: No results for input(s): TSH, T4TOTAL, FREET4, T3FREE, THYROIDAB in the last 72 hours. Anemia Panel: No results for input(s): VITAMINB12, FOLATE, FERRITIN, TIBC, IRON, RETICCTPCT in the last 72 hours. Sepsis Labs: No results for input(s): PROCALCITON, LATICACIDVEN in the last 168 hours.  Recent Results (from the past 240 hour(s))  MRSA Next Gen by PCR, Nasal     Status: Abnormal   Collection Time: 11/25/21  2:00 PM  Result Value Ref Range Status   MRSA by PCR Next Gen DETECTED (A) NOT DETECTED Final    Comment: MELAINIE FLORES RN 11/25/21 @2009  BY JW (NOTE) The GeneXpert MRSA Assay (FDA approved for NASAL specimens only), is one component of a comprehensive MRSA colonization surveillance program. It is not intended to diagnose MRSA infection nor to guide or monitor treatment for MRSA infections. Test performance is not FDA approved in patients less than 63 years old. Performed at [facility], [place] [address], [place], [place] [address]   Culture, blood (Routine X 2) w Reflex to ID Panel     Status: None   Collection Time: 11/27/21  9:54 AM   Specimen: BLOOD LEFT HAND  Result Value Ref Range Status   Specimen Description BLOOD LEFT HAND  Final   Special Requests   Final    BOTTLES DRAWN AEROBIC AND ANAEROBIC Blood Culture adequate volume   Culture   Final    NO GROWTH 5 DAYS Performed at [facility], [place] [address] [place], [place] [address]    Report Status 12/02/2021 FINAL  Final  Culture, blood (Routine X  2) w Reflex to ID Panel     Status: None   Collection Time: 11/27/21  9:54 AM   Specimen: BLOOD RIGHT HAND  Result Value Ref Range Status   Specimen Description BLOOD RIGHT HAND  Final   Special Requests   Final    BOTTLES DRAWN AEROBIC AND ANAEROBIC Blood Culture adequate volume   Culture   Final    NO GROWTH 5 DAYS Performed at [facility], [place] [address], [place], [place] [address]    Report Status 12/02/2021 FINAL  Final  Body fluid culture w Gram Stain     Status: None (Preliminary result)   Collection Time: 12/02/21  9:16 AM   Specimen: PATH Cytology Misc. fluid; Body Fluid  Result Value Ref Range Status   Specimen Description SYNOVIAL  Final   Special Requests RIGHT HIP  Final   Gram Stain NO WBC SEEN NO ORGANISMS SEEN   Final   Culture   Final    NO GROWTH 3 DAYS Performed at [facility], [address] [place],  [address]    Report Status PENDING  Incomplete      Radiology Studies: No results found.  Scheduled Meds:  amLODipine  5 mg Oral Daily   busPIRone  15 mg Oral BID   carvedilol  3.125 mg Oral BID WC   Chlorhexidine Gluconate Cloth  6 each Topical Q0600   citalopram  10 mg Oral Daily   diclofenac Sodium  2 g Topical QID   enoxaparin (LOVENOX) injection  30 mg Subcutaneous Q24H   feeding supplement  237 mL Oral TID BM  folic acid  1 mg Oral Daily   gabapentin  200 mg Oral TID   multivitamin with minerals  1 tablet Oral Daily   mupirocin ointment   Nasal BID   pantoprazole  40 mg Oral Daily   senna-docusate  2 tablet Oral QHS   thiamine  100 mg Oral Daily   Continuous Infusions:  vancomycin 750 mg (12/05/21 0953)     LOS: 10 days   Time spent: 24 minutes  Darliss Cheney, MD [facility]  12/05/2021, 10:59 AM  Please page via [facility] and do not message via secure chat for urgent patient care matters. Secure chat can be used for non urgent patient care matters.  How to contact the [facility] Attending or Consulting provider [place] or covering provider during after hours [place], for this patient?  Check the care team in [facility] and look for a) attending/consulting [facility] provider listed and b) the [facility] team listed. Page or secure chat [phone]. Log into [email or web address] and use [place]'s universal password to access. If you do not have the password, please contact the hospital operator. Locate the [facility] provider you are looking for under [facility] and page to a number that you can be directly reached. If you still have difficulty reaching the provider, please page the [facility] (Director on Call) for the Hospitalists listed on amion for assistance.

## 2021-12-06 ENCOUNTER — Other Ambulatory Visit (HOSPITAL_COMMUNITY): Payer: Self-pay

## 2021-12-06 DIAGNOSIS — L03114 Cellulitis of left upper limb: Secondary | ICD-10-CM | POA: Diagnosis not present

## 2021-12-06 DIAGNOSIS — M25552 Pain in left hip: Secondary | ICD-10-CM | POA: Diagnosis not present

## 2021-12-06 DIAGNOSIS — G8929 Other chronic pain: Secondary | ICD-10-CM | POA: Diagnosis not present

## 2021-12-06 DIAGNOSIS — L89159 Pressure ulcer of sacral region, unspecified stage: Secondary | ICD-10-CM | POA: Diagnosis not present

## 2021-12-06 LAB — BASIC METABOLIC PANEL
Anion gap: 11 (ref 5–15)
BUN: 24 mg/dL — ABNORMAL HIGH (ref 6–20)
CO2: 23 mmol/L (ref 22–32)
Calcium: 9.3 mg/dL (ref 8.9–10.3)
Chloride: 101 mmol/L (ref 98–111)
Creatinine, Ser: 1.51 mg/dL — ABNORMAL HIGH (ref 0.44–1.00)
GFR, Estimated: 41 mL/min — ABNORMAL LOW (ref >60)
Glucose, Bld: 99 mg/dL (ref 70–99)
Potassium: 4.4 mmol/L (ref 3.5–5.1)
Sodium: 135 mmol/L (ref 135–145)

## 2021-12-06 NOTE — Plan of Care (Signed)
  Problem: Nutrition: Goal: Adequate nutrition will be maintained Outcome: Progressing   Problem: Pain Managment: Goal: General experience of comfort will improve Outcome: Progressing   Problem: Safety: Goal: Ability to remain free from injury will improve Outcome: Progressing   

## 2021-12-06 NOTE — Progress Notes (Signed)
[facility] for Infectious Disease    Date of Admission:  11/25/2021   Total days of antibiotics 12          ID: Sherry Baker is a 56 y.o. female with MRSA complicated bacteremia Principal Problem:   Cellulitis of left hand Active Problems:   Severe malnutrition (HCC)   Anemia   Hypertension   Polysubstance abuse (HCC)   Pustular rash   Hyponatremia   CKD (chronic kidney disease), stage III (HCC)   Colostomy in place [facility])   MRSA bacteremia   Right hip pain    Subjective: Afebrile, some right hip pain but feeling slightly better  ROS: 12 point ros is otherwise negative  Medications:   amLODipine  5 mg Oral Daily   busPIRone  15 mg Oral BID   carvedilol  3.125 mg Oral BID WC   Chlorhexidine Gluconate Cloth  6 each Topical Q0600   citalopram  10 mg Oral Daily   diclofenac Sodium  2 g Topical QID   enoxaparin (LOVENOX) injection  30 mg Subcutaneous Q24H   feeding supplement  237 mL Oral TID BM   folic acid  1 mg Oral Daily   gabapentin  200 mg Oral TID   multivitamin with minerals  1 tablet Oral Daily   mupirocin ointment   Nasal BID   pantoprazole  40 mg Oral Daily   senna-docusate  2 tablet Oral QHS   thiamine  100 mg Oral Daily    Objective: Vital signs in last 24 hours: Temp:  [98.1 F (36.7 C)-99.1 F (37.3 C)] 98.1 F (36.7 C) (02/03 1500) Pulse Rate:  [72-85] 80 (02/03 1500) Resp:  [16-18] 18 (02/03 1500) BP: (108-150)/(60-91) 108/70 (02/03 1500) SpO2:  [95 %-99 %] 97 % (02/03 1500) Weight:  [44 kg] 44 kg (02/03 0500)  Physical Exam  Constitutional:  oriented to person, place, and time. appears well-developed and well-nourished. No distress.  HENT: [place]/AT, PERRLA, no scleral icterus Mouth/Throat: Oropharynx is clear and moist. No oropharyngeal exudate.  Cardiovascular: Normal rate, regular rhythm and normal heart sounds. Exam reveals no gallop and no friction rub.  No murmur heard.  Pulmonary/Chest: Effort normal and breath sounds normal. No  respiratory distress.  has no wheezes.  Neck = supple, no nuchal rigidity Abdominal: Soft. Bowel sounds are normal.  exhibits no distension. Ostomy in place.There is no tenderness.  bacK: skin lesions less erythematous. Sacral decub covered no purulent drainage Lymphadenopathy: no cervical adenopathy. No axillary adenopathy Neurological: alert and oriented to person, place, and time.  Skin: Skin is warm and dry. Left forearm healing Psychiatric: a normal mood and affect.  behavior is normal.    Lab Results Recent Labs    12/05/21 0104 12/06/21 0120  NA 134* 135  K 4.2 4.4  CL 102 101  CO2 21* 23  BUN 20 24*  CREATININE 1.24* 1.51*   Liver Panel No results for input(s): PROT, ALBUMIN, AST, ALT, ALKPHOS, BILITOT, BILIDIR, IBILI in the last 72 hours. Sedimentation Rate No results for input(s): ESRSEDRATE in the last 72 hours. C-Reactive Protein No results for input(s): CRP in the last 72 hours.  Microbiology: Hip aspirate= no growth to date Studies/Results: No results found.   Assessment/Plan: Complicated MRSA bacteremia = plan to treat for 3 wk using 1/25 as day 1 then convert rest to orals,may need to extend up to 3 wks for presumed septic arthritis.  Sacral decub= continue with dressing changes and improved nutrition  Chronic pain  for severe oak = defer to primary team.  Will see back on [place] for Infectious Diseases Pager: [phone]  12/06/2021, 4:04 PM

## 2021-12-06 NOTE — Progress Notes (Signed)
Will PROGRESS NOTE    Sherry Baker  HWE:[id number] DOB: 02/04/1966 DOA: 11/25/2021 PCP: Pcp, No   Brief Narrative:  56 y.o. female with medical history significant for polysubstance abuse, including cocaine use as well as alcohol abuse, type I bipolar disorder, central hypertension, generalized anxiety disorder, stage IIIb chronic kidney disease with associated baseline creatinine 1.3-1.6, chronic anemia with baseline hemoglobin 10-12, who was admitted to [facility] on 11/18/2021 with acute metabolic encephalopathy after presenting from home to [facility] ED for evaluation of altered mental status.  Recently hospitalized from December 22 to January 4 for altered mental status.  At that time also she was on Precedex.  She went into respiratory failure had to be intubated.  Supposed to discharge to skilled nursing facility which the patient eventually declined and went home.  Presented back on 1/16 with altered mental status.  Again placed on Precedex infusion in the ICU.  Was found to have maculopapular rash which became pustular.  Etiology was unclear.  Left AMA from the hospital on 11/23/2021.  Patient return to the ER due to swelling of her hand.  There was also concern that the rash on her back could be from monkey pox.  Patient was hospitalized for further management.  Subsequently noted to have MRSA bacteremia.  Infectious disease was consulted.  S/p TEE which was negative Slow to improve with continued intermittent fevers.  Assessment & Plan:   Principal Problem:   Cellulitis of left hand Active Problems:   Severe malnutrition (HCC)   Anemia   Hypertension   Polysubstance abuse (HCC)   Pustular rash   Hyponatremia   CKD (chronic kidney disease), stage III (HCC)   Colostomy in place [facility])   MRSA bacteremia   Right hip pain  Cellulitis of left hand/MRSA bacteremia/history of IVDU- (present on admission).  MRSA bacteremia likely secondary to IVDU. Seen by orthopedics.  Plan is to  continue with the antibiotic treatment.  Pain controlled. -warm soaks. Also seen by ID and they are managing antibiotics.  Currently she is on vancomycin.  Per ID, patient will need IV vancomycin at least for 3 weeks since her recent negative blood culture which was 11/27/2021, 3 weeks the through 12/18/2021 and then they will think about possible oral options.  Unfortunately, due to history of IVDU, patient cannot be discharged with PICC line.  Needs to stay in the hospital to complete at least 10 days of IV vancomycin, patient agreeable with this plan.  Echocardiogram does not show any obvious vegetations.   -TEE negative -repeat BC NGTD   Right hip pain- (present on admission) -x ray negative. Mri suspicious for possible septic joint though known OA also ddx.  Ortho on board.  Underwent aspiration, fluid analysis so far argues against infection.  Ortho recommends THA however also recommends that she needs to be clean for 6 months and healthier.   Colostomy in place [facility]) Seen by ostomy nurse.   CKD (chronic kidney disease), stage III ([place])- (present on admission): Renal function at baseline.   Hyponatremia: Mild and improving.  Pustular rash- (present on admission) - pustular rash.  Seen by ID and they feel that this is most likely secondary to MRSA.  Continue contact precautions.  Rash resolved.   Polysubstance abuse ([place])- (present on admission) -known history of IV drug use and polysubstance abuse.   -Urine drug screen positive for benzodiazepine and cocaine -continue to encourage cessation    GERD (gastroesophageal reflux disease) Chronic.  Continue PPI.  Hypertension- (present on admission): Blood pressure controlled.  Continue amlodipine and Coreg.   Anemia- (present on admission) Chronic for the most part.   -No overt bleeding noted.  Continue to monitor. -transfuse for <7     Severe malnutrition ([place])- (present on admission) Encourage oral intake.    DVT prophylaxis:  enoxaparin (LOVENOX) injection 30 mg Start: 11/25/21 1600 SCDs Start: 11/25/21 0434   Code Status: Full Code  Family Communication:  None present at bedside.  Plan of care discussed with patient in length and he/she verbalized understanding and agreed with it.  Status is: Inpatient  Remains inpatient appropriate because: Needs IV antibiotics, history of IVDU, cannot be discharged with IV line.  Estimated body mass index is 17.76 kg/m as calculated from the following:   Height as of this encounter: 5\' 2"  (1.575 m).   Weight as of this encounter: 44 kg.  Pressure Injury 10/25/21 Sacrum Medial Stage 3 -  Full thickness tissue loss. Subcutaneous fat may be visible but bone, tendon or muscle are NOT exposed. (Active)  10/25/21 1421  Location: Sacrum  Location Orientation: Medial  Staging: Stage 3 -  Full thickness tissue loss. Subcutaneous fat may be visible but bone, tendon or muscle are NOT exposed.  Wound Description (Comments):   Present on Admission: Yes   Nutritional Assessment: Body mass index is 17.76 kg/m.Marland Kitchen Seen by dietician.  I agree with the assessment and plan as outlined below: Nutrition Status: Nutrition Problem: Severe Malnutrition (in the context of social/environmental circumstances) Etiology:  (lack of nutritient dense foods with polysubstance abuse) . Skin Assessment: I have examined the patient's skin and I agree with the wound assessment as performed by the wound care RN as outlined below: Pressure Injury 10/25/21 Sacrum Medial Stage 3 -  Full thickness tissue loss. Subcutaneous fat may be visible but bone, tendon or muscle are NOT exposed. (Active)  10/25/21 1421  Location: Sacrum  Location Orientation: Medial  Staging: Stage 3 -  Full thickness tissue loss. Subcutaneous fat may be visible but bone, tendon or muscle are NOT exposed.  Wound Description (Comments):   Present on Admission: Yes    Consultants:  Orthopedics ID  Procedures:  As  above  Antimicrobials:  Anti-infectives (From admission, onward)    Start     Dose/Rate Route Frequency Ordered Stop   12/03/21 1000  vancomycin (VANCOREADY) IVPB 750 mg/150 mL        750 mg 150 mL/hr over 60 Minutes Intravenous Every 48 hours 12/02/21 0556     11/30/21 0000  vancomycin (VANCOREADY) IVPB 1250 mg/250 mL  Status:  Discontinued        1,250 mg 166.7 mL/hr over 90 Minutes Intravenous Every 48 hours 11/29/21 0933 11/29/21 0940   11/30/21 0000  vancomycin (VANCOREADY) IVPB 500 mg/100 mL  Status:  Discontinued        500 mg 100 mL/hr over 60 Minutes Intravenous Every 24 hours 11/29/21 0940 12/02/21 0556   11/28/21 1200  vancomycin (VANCOREADY) IVPB 750 mg/150 mL  Status:  Discontinued        750 mg 150 mL/hr over 60 Minutes Intravenous Every 36 hours 11/27/21 0917 11/27/21 0919   11/28/21 0000  vancomycin (VANCOREADY) IVPB 500 mg/100 mL  Status:  Discontinued        500 mg 100 mL/hr over 60 Minutes Intravenous Every 24 hours 11/27/21 0919 11/29/21 0933   11/27/21 0000  vancomycin (VANCOREADY) IVPB 750 mg/150 mL  Status:  Discontinued  750 mg 150 mL/hr over 60 Minutes Intravenous Every 48 hours 11/25/21 0422 11/27/21 0917   11/26/21 0400  cefTRIAXone (ROCEPHIN) 1 g in sodium chloride 0.9 % 100 mL IVPB  Status:  Discontinued        1 g 200 mL/hr over 30 Minutes Intravenous Every 24 hours 11/25/21 0434 11/25/21 1149   11/25/21 0430  vancomycin (VANCOCIN) IVPB 1000 mg/200 mL premix        1,000 mg 200 mL/hr over 60 Minutes Intravenous  Once 11/25/21 0422 11/25/21 0552   11/25/21 0315  cefTRIAXone (ROCEPHIN) 2 g in sodium chloride 0.9 % 100 mL IVPB        2 g 200 mL/hr over 30 Minutes Intravenous  Once 11/25/21 0311 11/25/21 0435         Subjective: Patient seen and examined.  She has no complaints.  She had requested that I call her cousin Crystal as she happens to have some questions.  I called and left a voicemail.  Objective: Vitals:   12/05/21 2214 12/06/21  0100 12/06/21 0500 12/06/21 0555  BP: (!) 150/91 111/60  114/67  Pulse: 72 85  80  Resp: 18 16  16   Temp: 98.8 F (37.1 C) 99.1 F (37.3 C)  98.4 F (36.9 C)  TempSrc: Oral Oral  Oral  SpO2: 99% 95%  96%  Weight:   44 kg   Height:        Intake/Output Summary (Last 24 hours) at 12/06/2021 0829 Last data filed at 12/05/2021 1330 Gross per 24 hour  Intake 640 ml  Output --  Net 640 ml    Filed Weights   12/04/21 2156 12/05/21 0220 12/06/21 0500  Weight: 43 kg 43 kg 44 kg    Examination: General exam: Appears calm and comfortable, cachectic Respiratory system: Clear to auscultation. Respiratory effort normal. Cardiovascular system: S1 & S2 heard, RRR. No JVD, murmurs, rubs, gallops or clicks. No pedal edema. Gastrointestinal system: Abdomen is nondistended, soft and nontender. No organomegaly or masses felt. Normal bowel sounds heard. Central nervous system: Alert and oriented. No focal neurological deficits. Extremities: Symmetric 5 x 5 power. Skin: No rashes, lesions or ulcers.  Psychiatry: Judgement and insight appear normal. Mood & affect appropriate.    Data Reviewed: I have personally reviewed following labs and imaging studies  CBC: Recent Labs  Lab 11/30/21 0120 12/01/21 0153 12/03/21 0104  WBC 4.4 5.1 5.6  NEUTROABS  --   --  4.1  HGB 7.6* 7.8* 9.1*  HCT 24.0* 24.4* 28.2*  MCV 87.6 87.5 87.6  PLT 200 262 883    Basic Metabolic Panel: Recent Labs  Lab 12/02/21 0436 12/03/21 0104 12/04/21 0151 12/05/21 0104 12/06/21 0120  NA 133* 134* 134* 134* 135  K 3.9 4.1 4.6 4.2 4.4  CL 102 101 104 102 101  CO2 19* 22 22 21* 23  GLUCOSE 90 108* 104* 106* 99  BUN 18 18 18 20  24*  CREATININE 1.31* 1.29* 1.50* 1.24* 1.51*  CALCIUM 8.8* 9.2 9.0 9.3 9.3  MG  --  1.9  --   --   --     GFR: Estimated Creatinine Clearance: 29.6 mL/min (A) (by C-G formula based on SCr of 1.51 mg/dL (H)). Liver Function Tests: No results for input(s): AST, ALT, ALKPHOS, BILITOT,  PROT, ALBUMIN in the last 168 hours.  No results for input(s): LIPASE, AMYLASE in the last 168 hours. No results for input(s): AMMONIA in the last 168 hours. Coagulation Profile: No results for  input(s): INR, PROTIME in the last 168 hours. Cardiac Enzymes: No results for input(s): CKTOTAL, CKMB, CKMBINDEX, TROPONINI in the last 168 hours. BNP (last 3 results) No results for input(s): PROBNP in the last 8760 hours. HbA1C: No results for input(s): HGBA1C in the last 72 hours. CBG: No results for input(s): GLUCAP in the last 168 hours. Lipid Profile: No results for input(s): CHOL, HDL, LDLCALC, TRIG, CHOLHDL, LDLDIRECT in the last 72 hours. Thyroid Function Tests: No results for input(s): TSH, T4TOTAL, FREET4, T3FREE, THYROIDAB in the last 72 hours. Anemia Panel: No results for input(s): VITAMINB12, FOLATE, FERRITIN, TIBC, IRON, RETICCTPCT in the last 72 hours. Sepsis Labs: No results for input(s): PROCALCITON, LATICACIDVEN in the last 168 hours.  Recent Results (from the past 240 hour(s))  Culture, blood (Routine X 2) w Reflex to ID Panel     Status: None   Collection Time: 11/27/21  9:54 AM   Specimen: BLOOD LEFT HAND  Result Value Ref Range Status   Specimen Description BLOOD LEFT HAND  Final   Special Requests   Final    BOTTLES DRAWN AEROBIC AND ANAEROBIC Blood Culture adequate volume   Culture   Final    NO GROWTH 5 DAYS Performed at [facility], [address] [place], [place] [address]    Report Status 12/02/2021 FINAL  Final  Culture, blood (Routine X 2) w Reflex to ID Panel     Status: None   Collection Time: 11/27/21  9:54 AM   Specimen: BLOOD RIGHT HAND  Result Value Ref Range Status   Specimen Description BLOOD RIGHT HAND  Final   Special Requests   Final    BOTTLES DRAWN AEROBIC AND ANAEROBIC Blood Culture adequate volume   Culture   Final    NO GROWTH 5 DAYS Performed at [facility], [address] [place], [place] [address]    Report Status  12/02/2021 FINAL  Final  Body fluid culture w Gram Stain     Status: None   Collection Time: 12/02/21  9:16 AM   Specimen: PATH Cytology Misc. fluid; Body Fluid  Result Value Ref Range Status   Specimen Description SYNOVIAL  Final   Special Requests RIGHT HIP  Final   Gram Stain NO WBC SEEN NO ORGANISMS SEEN   Final   Culture   Final    NO GROWTH 3 DAYS Performed at [facility], [address], [place], [place] [address]    Report Status 12/05/2021 FINAL  Final      Radiology Studies: No results found.  Scheduled Meds:  amLODipine  5 mg Oral Daily   busPIRone  15 mg Oral BID   carvedilol  3.125 mg Oral BID WC   Chlorhexidine Gluconate Cloth  6 each Topical Q0600   citalopram  10 mg Oral Daily   diclofenac Sodium  2 g Topical QID   enoxaparin (LOVENOX) injection  30 mg Subcutaneous Q24H   feeding supplement  237 mL Oral TID BM   folic acid  1 mg Oral Daily   gabapentin  200 mg Oral TID   multivitamin with minerals  1 tablet Oral Daily   mupirocin ointment   Nasal BID   pantoprazole  40 mg Oral Daily   senna-docusate  2 tablet Oral QHS   thiamine  100 mg Oral Daily   Continuous Infusions:  vancomycin 750 mg (12/05/21 0953)     LOS: 11 days   Time spent: 23 minutes  Darliss Cheney,  MD [facility]  12/06/2021, 8:29 AM  Please page via [place] and do not message via secure chat for urgent patient care matters. Secure chat can be used for non urgent patient care matters.  How to contact the [facility] Attending or Consulting provider [place] or covering provider during after hours [place], for this patient?  Check the care team in [facility] and look for a) attending/consulting TR[facility] provider listed and b) the [facility] team listed. Page or secure chat [phone]. Log into [email or web address] and use [place] universal password to access. If you do not have the password, please contact the hospital operator. Locate the [facility] provider you are looking for under [facility] and page to  a number that you can be directly reached. If you still have difficulty reaching the provider, please page the [facility] (Director on Call) for the Hospitalists listed on amion for assistance.

## 2021-12-06 NOTE — TOC Benefit Eligibility Note (Signed)
Patient Teacher, English as a foreign language completed.    The patient is currently admitted and upon discharge could be taking linezolid (Zyvox) 600 mg tablet.  The current 21 day co-pay is, $4.00.   The patient is insured through [place], [place] Patient Advocate Specialist [place] Patient Advocate Team Direct Number: [phone]  Fax: [phone]

## 2021-12-07 DIAGNOSIS — L03114 Cellulitis of left upper limb: Secondary | ICD-10-CM | POA: Diagnosis not present

## 2021-12-07 NOTE — Progress Notes (Signed)
Will PROGRESS NOTE    Sherry Baker  FAO:[id number] DOB: 12/20/1965 DOA: 11/25/2021 PCP: Pcp, No   Brief Narrative:  56 y.o. female with medical history significant for polysubstance abuse, including cocaine use as well as alcohol abuse, type I bipolar disorder, central hypertension, generalized anxiety disorder, stage IIIb chronic kidney disease with associated baseline creatinine 1.3-1.6, chronic anemia with baseline hemoglobin 10-12, who was admitted to [facility] on 11/18/2021 with acute metabolic encephalopathy after presenting from home to [facility] ED for evaluation of altered mental status.  Recently hospitalized from December 22 to January 4 for altered mental status.  At that time also she was on Precedex.  She went into respiratory failure had to be intubated.  Supposed to discharge to skilled nursing facility which the patient eventually declined and went home.  Presented back on 1/16 with altered mental status.  Again placed on Precedex infusion in the ICU.  Was found to have maculopapular rash which became pustular.  Etiology was unclear.  Left AMA from the hospital on 11/23/2021.  Patient return to the ER due to swelling of her hand.  There was also concern that the rash on her back could be from monkey pox.  Patient was hospitalized for further management.  Subsequently noted to have MRSA bacteremia.  Infectious disease was consulted.  S/p TEE which was negative Slow to improve with continued intermittent fevers.  Assessment & Plan:   Principal Problem:   Cellulitis of left hand Active Problems:   Severe malnutrition (HCC)   Anemia   Hypertension   Polysubstance abuse (HCC)   Pustular rash   Hyponatremia   CKD (chronic kidney disease), stage III (HCC)   Colostomy in place [facility])   MRSA bacteremia   Right hip pain  Cellulitis of left hand/MRSA bacteremia/history of IVDU- (present on admission).  MRSA bacteremia likely secondary to IVDU. Seen by orthopedics.  Plan is to  continue with the antibiotic treatment.  Pain controlled. -warm soaks. Also seen by ID and they are managing antibiotics.  Currently she is on vancomycin.  Per ID, patient will need IV vancomycin at least for 3 weeks since her recent negative blood culture which was 11/27/2021, 3 weeks the through 12/18/2021 and then they will think about possible oral options.  Unfortunately, due to history of IVDU, patient cannot be discharged with PICC line.  Needs to stay in the hospital to complete at least 10 days of IV vancomycin, patient agreeable with this plan.  Echocardiogram does not show any obvious vegetations.   -TEE negative -repeat BC NGTD   Right hip pain- (present on admission) -x ray negative. Mri suspicious for possible septic joint though known OA also ddx.  Ortho on board.  Underwent aspiration, fluid analysis so far argues against infection.  Ortho recommends THA however also recommends that she needs to be clean for 6 months and healthier.   Colostomy in place [facility]) Seen by ostomy nurse.   CKD (chronic kidney disease), stage III ([place])- (present on admission): Renal function at baseline.   Hyponatremia: Mild and improving.  Pustular rash- (present on admission) - pustular rash.  Seen by ID and they feel that this is most likely secondary to MRSA.  Continue contact precautions.  Rash resolved.   Polysubstance abuse ([place])- (present on admission) -known history of IV drug use and polysubstance abuse.   -Urine drug screen positive for benzodiazepine and cocaine -continue to encourage cessation    GERD (gastroesophageal reflux disease) Chronic.  Continue PPI.  Hypertension- (present on admission): Blood pressure controlled.  Continue amlodipine and Coreg.   Anemia- (present on admission) Chronic for the most part.   -No overt bleeding noted.  Continue to monitor. -transfuse for <7     Severe malnutrition ([place])- (present on admission) Encourage oral intake.    DVT prophylaxis:  enoxaparin (LOVENOX) injection 30 mg Start: 11/25/21 1600 SCDs Start: 11/25/21 0434   Code Status: Full Code  Family Communication:  None present at bedside.  Plan of care discussed with patient in length as well as her aunt Rip Harbour per patient's request.  Status is: Inpatient  Remains inpatient appropriate because: Needs IV antibiotics, history of IVDU, cannot be discharged with IV line.  Estimated body mass index is 17.91 kg/m as calculated from the following:   Height as of this encounter: 5\' 2"  (1.575 m).   Weight as of this encounter: 44.4 kg.  Pressure Injury 10/25/21 Sacrum Medial Stage 3 -  Full thickness tissue loss. Subcutaneous fat may be visible but bone, tendon or muscle are NOT exposed. (Active)  10/25/21 1421  Location: Sacrum  Location Orientation: Medial  Staging: Stage 3 -  Full thickness tissue loss. Subcutaneous fat may be visible but bone, tendon or muscle are NOT exposed.  Wound Description (Comments):   Present on Admission: Yes   Nutritional Assessment: Body mass index is 17.91 kg/m.Marland Kitchen Seen by dietician.  I agree with the assessment and plan as outlined below: Nutrition Status: Nutrition Problem: Severe Malnutrition (in the context of social/environmental circumstances) Etiology:  (lack of nutritient dense foods with polysubstance abuse) . Skin Assessment: I have examined the patient's skin and I agree with the wound assessment as performed by the wound care RN as outlined below: Pressure Injury 10/25/21 Sacrum Medial Stage 3 -  Full thickness tissue loss. Subcutaneous fat may be visible but bone, tendon or muscle are NOT exposed. (Active)  10/25/21 1421  Location: Sacrum  Location Orientation: Medial  Staging: Stage 3 -  Full thickness tissue loss. Subcutaneous fat may be visible but bone, tendon or muscle are NOT exposed.  Wound Description (Comments):   Present on Admission: Yes    Consultants:  Orthopedics ID  Procedures:  As  above  Antimicrobials:  Anti-infectives (From admission, onward)    Start     Dose/Rate Route Frequency Ordered Stop   12/03/21 1000  vancomycin (VANCOREADY) IVPB 750 mg/150 mL        750 mg 150 mL/hr over 60 Minutes Intravenous Every 48 hours 12/02/21 0556     11/30/21 0000  vancomycin (VANCOREADY) IVPB 1250 mg/250 mL  Status:  Discontinued        1,250 mg 166.7 mL/hr over 90 Minutes Intravenous Every 48 hours 11/29/21 0933 11/29/21 0940   11/30/21 0000  vancomycin (VANCOREADY) IVPB 500 mg/100 mL  Status:  Discontinued        500 mg 100 mL/hr over 60 Minutes Intravenous Every 24 hours 11/29/21 0940 12/02/21 0556   11/28/21 1200  vancomycin (VANCOREADY) IVPB 750 mg/150 mL  Status:  Discontinued        750 mg 150 mL/hr over 60 Minutes Intravenous Every 36 hours 11/27/21 0917 11/27/21 0919   11/28/21 0000  vancomycin (VANCOREADY) IVPB 500 mg/100 mL  Status:  Discontinued        500 mg 100 mL/hr over 60 Minutes Intravenous Every 24 hours 11/27/21 0919 11/29/21 0933   11/27/21 0000  vancomycin (VANCOREADY) IVPB 750 mg/150 mL  Status:  Discontinued  750 mg 150 mL/hr over 60 Minutes Intravenous Every 48 hours 11/25/21 0422 11/27/21 0917   11/26/21 0400  cefTRIAXone (ROCEPHIN) 1 g in sodium chloride 0.9 % 100 mL IVPB  Status:  Discontinued        1 g 200 mL/hr over 30 Minutes Intravenous Every 24 hours 11/25/21 0434 11/25/21 1149   11/25/21 0430  vancomycin (VANCOCIN) IVPB 1000 mg/200 mL premix        1,000 mg 200 mL/hr over 60 Minutes Intravenous  Once 11/25/21 0422 11/25/21 0552   11/25/21 0315  cefTRIAXone (ROCEPHIN) 2 g in sodium chloride 0.9 % 100 mL IVPB        2 g 200 mL/hr over 30 Minutes Intravenous  Once 11/25/21 0311 11/25/21 0435         Subjective: Seen and examined.  She has no complaints.  She is requesting to call her aunt.  Objective: Vitals:   12/06/21 2238 12/07/21 0441 12/07/21 0500 12/07/21 0747  BP: 134/71 112/72  113/68  Pulse: 78 84  67  Resp: 17  16  16   Temp: 98.2 F (36.8 C) 98 F (36.7 C)  98.1 F (36.7 C)  TempSrc: Oral Oral  Oral  SpO2: 99% 98%  98%  Weight:   44.4 kg   Height:        Intake/Output Summary (Last 24 hours) at 12/07/2021 1259 Last data filed at 12/07/2021 0900 Gross per 24 hour  Intake 1320 ml  Output 400 ml  Net 920 ml    Filed Weights   12/05/21 0220 12/06/21 0500 12/07/21 0500  Weight: 43 kg 44 kg 44.4 kg    Examination:  General exam: Appears calm and comfortable, cachectic Respiratory system: Clear to auscultation. Respiratory effort normal. Cardiovascular system: S1 & S2 heard, RRR. No JVD, murmurs, rubs, gallops or clicks. No pedal edema. Gastrointestinal system: Abdomen is nondistended, soft and nontender. No organomegaly or masses felt. Normal bowel sounds heard. Central nervous system: Alert and oriented. No focal neurological deficits. Extremities: Symmetric 5 x 5 power. Skin: No rashes, lesions or ulcers.    Data Reviewed: I have personally reviewed following labs and imaging studies  CBC: Recent Labs  Lab 12/01/21 0153 12/03/21 0104  WBC 5.1 5.6  NEUTROABS  --  4.1  HGB 7.8* 9.1*  HCT 24.4* 28.2*  MCV 87.5 87.6  PLT 262 470    Basic Metabolic Panel: Recent Labs  Lab 12/02/21 0436 12/03/21 0104 12/04/21 0151 12/05/21 0104 12/06/21 0120  NA 133* 134* 134* 134* 135  K 3.9 4.1 4.6 4.2 4.4  CL 102 101 104 102 101  CO2 19* 22 22 21* 23  GLUCOSE 90 108* 104* 106* 99  BUN 18 18 18 20  24*  CREATININE 1.31* 1.29* 1.50* 1.24* 1.51*  CALCIUM 8.8* 9.2 9.0 9.3 9.3  MG  --  1.9  --   --   --     GFR: Estimated Creatinine Clearance: 29.9 mL/min (A) (by C-G formula based on SCr of 1.51 mg/dL (H)). Liver Function Tests: No results for input(s): AST, ALT, ALKPHOS, BILITOT, PROT, ALBUMIN in the last 168 hours.  No results for input(s): LIPASE, AMYLASE in the last 168 hours. No results for input(s): AMMONIA in the last 168 hours. Coagulation Profile: No results for input(s):  INR, PROTIME in the last 168 hours. Cardiac Enzymes: No results for input(s): CKTOTAL, CKMB, CKMBINDEX, TROPONINI in the last 168 hours. BNP (last 3 results) No results for input(s): PROBNP in the last 8760 hours.  HbA1C: No results for input(s): HGBA1C in the last 72 hours. CBG: No results for input(s): GLUCAP in the last 168 hours. Lipid Profile: No results for input(s): CHOL, HDL, LDLCALC, TRIG, CHOLHDL, LDLDIRECT in the last 72 hours. Thyroid Function Tests: No results for input(s): TSH, T4TOTAL, FREET4, T3FREE, THYROIDAB in the last 72 hours. Anemia Panel: No results for input(s): VITAMINB12, FOLATE, FERRITIN, TIBC, IRON, RETICCTPCT in the last 72 hours. Sepsis Labs: No results for input(s): PROCALCITON, LATICACIDVEN in the last 168 hours.  Recent Results (from the past 240 hour(s))  Body fluid culture w Gram Stain     Status: None   Collection Time: 12/02/21  9:16 AM   Specimen: PATH Cytology Misc. fluid; Body Fluid  Result Value Ref Range Status   Specimen Description SYNOVIAL  Final   Special Requests RIGHT HIP  Final   Gram Stain NO WBC SEEN NO ORGANISMS SEEN   Final   Culture   Final    NO GROWTH 3 DAYS Performed at [facility], [address], [place], [place] [address]    Report Status 12/05/2021 FINAL  Final      Radiology Studies: No results found.  Scheduled Meds:  amLODipine  5 mg Oral Daily   busPIRone  15 mg Oral BID   carvedilol  3.125 mg Oral BID WC   Chlorhexidine Gluconate Cloth  6 each Topical Q0600   citalopram  10 mg Oral Daily   diclofenac Sodium  2 g Topical QID   enoxaparin (LOVENOX) injection  30 mg Subcutaneous Q24H   feeding supplement  237 mL Oral TID BM   folic acid  1 mg Oral Daily   gabapentin  200 mg Oral TID   multivitamin with minerals  1 tablet Oral Daily   mupirocin ointment   Nasal BID   pantoprazole  40 mg Oral Daily   senna-docusate  2 tablet Oral QHS   thiamine  100 mg Oral Daily   Continuous Infusions:   vancomycin 750 mg (12/07/21 1051)     LOS: 12 days   Time spent: 22 minutes  Darliss Cheney, MD [facility]  12/07/2021, 12:59 PM  Please page via [facility] and do not message via secure chat for urgent patient care matters. Secure chat can be used for non urgent patient care matters.  How to contact the [facility] Attending or Consulting provider [place] or covering provider during after hours [place], for this patient?  Check the care team in [facility] and look for a) attending/consulting [facility] provider listed and b) the [facility] team listed. Page or secure chat [phone]. Log into [email or web address] and use [place]'s universal password to access. If you do not have the password, please contact the hospital operator. Locate the [facility] provider you are looking for under [facility] and page to a number that you can be directly reached. If you still have difficulty reaching the provider, please page the [facility] (Director on Call) for the Hospitalists listed on amion for assistance.

## 2021-12-08 DIAGNOSIS — L03114 Cellulitis of left upper limb: Secondary | ICD-10-CM | POA: Diagnosis not present

## 2021-12-08 NOTE — Progress Notes (Signed)
Pharmacy Antibiotic Note  Sherry Baker is a 56 y.o. female admitted on 2/95/2841 with complicated MRSA bacteremia . TEE negative. SCr stable between 1.2-1.5, WBC WNL, afebrile. ID following and planning 10 days of additional IV therapy until able to transition to PO antibiotics. Pharmacy has been consulted for vancomycin dosing.  Plan: Continue vancomycin IV 750 mg Q48H (goal AUC 400-550) Monitor clinical status, renal function, cultures, and ID recommendations Obtain vancomycin peak and trough levels this week Follow ID plans for length of therapy and PO antibiotic plans  Height: 5\' 2"  (157.5 cm) Weight: 43.4 kg (95 lb 11.2 oz) IBW/kg (Calculated) : 50.1  Temp (24hrs), Avg:99.1 F (37.3 C), Min:98 F (36.7 C), Max:99.7 F (37.6 C)  Recent Labs  Lab 12/02/21 0026 12/02/21 0436 12/03/21 0104 12/04/21 0151 12/05/21 0104 12/06/21 0120  WBC  --   --  5.6  --   --   --   CREATININE  --  1.31* 1.29* 1.50* 1.24* 1.51*  VANCOTROUGH 13*  --   --   --   --   --   VANCOPEAK  --  38  --   --   --   --     Estimated Creatinine Clearance: 29.2 mL/min (A) (by C-G formula based on SCr of 1.51 mg/dL (H)).    Allergies  Allergen Reactions   Penicillins Hives and Swelling    Has patient had a PCN reaction causing immediate rash, facial/tongue/throat swelling, SOB or lightheadedness with hypotension: Yes Has patient had a PCN reaction causing severe rash involving mucus membranes or skin necrosis: No Has patient had a PCN reaction that required hospitalization No Has patient had a PCN reaction occurring within the last 10 years: Yes If all of the above answers are "NO", then may proceed with Cephalosporin use.     Antimicrobials this admission: Ceftriaxone 1/23 >> 1/23 Vancomycin 1/23 >>  Dose adjustments this admission: 1/30 vancomycin IV 500 mg Q24H > vancomycin IV 750 mg Q48H (due to supratherapeutic AUC of 592)  Microbiology results: 1/23 BCx: MRSA 1/23 MRSA PCR:  positive 1/23 VZV: negative 1/24 BCx: negative 1/30 Synovial R hip Cx: no growth  Thank you for allowing pharmacy to be a part of this patients care.  Shauna Hugh, PharmD, [place]  PGY-2 Pharmacy Resident 12/08/2021 1:09 PM  Please check [email or web address] for unit-specific pharmacy phone numbers.

## 2021-12-08 NOTE — Progress Notes (Signed)
Will PROGRESS NOTE    Sherry Baker  NIO:[id number] DOB: 1966/05/07 DOA: 11/25/2021 PCP: Pcp, No   Brief Narrative:  56 y.o. female with medical history significant for polysubstance abuse, including cocaine use as well as alcohol abuse, type I bipolar disorder, central hypertension, generalized anxiety disorder, stage IIIb chronic kidney disease with associated baseline creatinine 1.3-1.6, chronic anemia with baseline hemoglobin 10-12, who was admitted to [facility] on 11/18/2021 with acute metabolic encephalopathy after presenting from home to [facility] ED for evaluation of altered mental status.  Recently hospitalized from December 22 to January 4 for altered mental status.  At that time also she was on Precedex.  She went into respiratory failure had to be intubated.  Supposed to discharge to skilled nursing facility which the patient eventually declined and went home.  Presented back on 1/16 with altered mental status.  Again placed on Precedex infusion in the ICU.  Was found to have maculopapular rash which became pustular.  Etiology was unclear.  Left AMA from the hospital on 11/23/2021.  Patient return to the ER due to swelling of her hand.  There was also concern that the rash on her back could be from monkey pox.  Patient was hospitalized for further management.  Subsequently noted to have MRSA bacteremia.  Infectious disease was consulted.  S/p TEE which was negative Slow to improve with continued intermittent fevers.  Assessment & Plan:   Principal Problem:   Cellulitis of left hand Active Problems:   Severe malnutrition (HCC)   Anemia   Hypertension   Polysubstance abuse (HCC)   Pustular rash   Hyponatremia   CKD (chronic kidney disease), stage III (HCC)   Colostomy in place [facility])   MRSA bacteremia   Right hip pain  Cellulitis of left hand/MRSA bacteremia/history of IVDU- (present on admission).  MRSA bacteremia likely secondary to IVDU. Seen by orthopedics.  Plan is to  continue with the antibiotic treatment.  Pain controlled. -warm soaks. Also seen by ID and they are managing antibiotics.  Currently she is on vancomycin.  Per ID, patient will need IV vancomycin at least for 3 weeks since her recent negative blood culture which was 11/27/2021, 3 weeks the through 12/18/2021 and then they will think about possible oral options.  Unfortunately, due to history of IVDU, patient cannot be discharged with PICC line.  Needs to stay in the hospital to complete at least 10 days of IV vancomycin, patient agreeable with this plan.  Echocardiogram does not show any obvious vegetations.   -TEE negative -repeat BC NGTD   Right hip pain- (present on admission) -x ray negative. Mri suspicious for possible septic joint though known OA also ddx.  Ortho on board.  Underwent aspiration, fluid analysis so far argues against infection.  Ortho recommends THA however also recommends that she needs to be clean for 6 months and healthier.   Colostomy in place [facility]) Seen by ostomy nurse.   CKD (chronic kidney disease), stage III ([place])- (present on admission): Renal function at baseline.   Hyponatremia: Mild and improving.  Pustular rash- (present on admission) - pustular rash.  Seen by ID and they feel that this is most likely secondary to MRSA.  Continue contact precautions.  Rash resolved.   Polysubstance abuse ([place])- (present on admission) -known history of IV drug use and polysubstance abuse.   -Urine drug screen positive for benzodiazepine and cocaine -continue to encourage cessation    GERD (gastroesophageal reflux disease) Chronic.  Continue PPI.  Hypertension- (present on admission): Blood pressure controlled.  Continue amlodipine and Coreg.   Anemia- (present on admission) Chronic for the most part.   -No overt bleeding noted.  Continue to monitor. -transfuse for <7     Severe malnutrition ([place])- (present on admission) Encouraged oral intake.  I will consult  nutrition today.    DVT prophylaxis: enoxaparin (LOVENOX) injection 30 mg Start: 11/25/21 1600 SCDs Start: 11/25/21 0434   Code Status: Full Code  Family Communication:  None present at bedside.  Plan of care discussed with patient in length   Status is: Inpatient  Remains inpatient appropriate because: Needs IV antibiotics, history of IVDU, cannot be discharged with IV line.  Estimated body mass index is 17.5 kg/m as calculated from the following:   Height as of this encounter: 5\' 2"  (1.575 m).   Weight as of this encounter: 43.4 kg.  Pressure Injury 10/25/21 Sacrum Medial Stage 3 -  Full thickness tissue loss. Subcutaneous fat may be visible but bone, tendon or muscle are NOT exposed. (Active)  10/25/21 1421  Location: Sacrum  Location Orientation: Medial  Staging: Stage 3 -  Full thickness tissue loss. Subcutaneous fat may be visible but bone, tendon or muscle are NOT exposed.  Wound Description (Comments):   Present on Admission: Yes   Nutritional Assessment: Body mass index is 17.5 kg/m.Marland Kitchen Seen by dietician.  I agree with the assessment and plan as outlined below: Nutrition Status: Nutrition Problem: Severe Malnutrition (in the context of social/environmental circumstances) Etiology:  (lack of nutritient dense foods with polysubstance abuse) . Skin Assessment: I have examined the patient's skin and I agree with the wound assessment as performed by the wound care RN as outlined below: Pressure Injury 10/25/21 Sacrum Medial Stage 3 -  Full thickness tissue loss. Subcutaneous fat may be visible but bone, tendon or muscle are NOT exposed. (Active)  10/25/21 1421  Location: Sacrum  Location Orientation: Medial  Staging: Stage 3 -  Full thickness tissue loss. Subcutaneous fat may be visible but bone, tendon or muscle are NOT exposed.  Wound Description (Comments):   Present on Admission: Yes    Consultants:  Orthopedics ID  Procedures:  As above  Antimicrobials:   Anti-infectives (From admission, onward)    Start     Dose/Rate Route Frequency Ordered Stop   12/03/21 1000  vancomycin (VANCOREADY) IVPB 750 mg/150 mL        750 mg 150 mL/hr over 60 Minutes Intravenous Every 48 hours 12/02/21 0556     11/30/21 0000  vancomycin (VANCOREADY) IVPB 1250 mg/250 mL  Status:  Discontinued        1,250 mg 166.7 mL/hr over 90 Minutes Intravenous Every 48 hours 11/29/21 0933 11/29/21 0940   11/30/21 0000  vancomycin (VANCOREADY) IVPB 500 mg/100 mL  Status:  Discontinued        500 mg 100 mL/hr over 60 Minutes Intravenous Every 24 hours 11/29/21 0940 12/02/21 0556   11/28/21 1200  vancomycin (VANCOREADY) IVPB 750 mg/150 mL  Status:  Discontinued        750 mg 150 mL/hr over 60 Minutes Intravenous Every 36 hours 11/27/21 0917 11/27/21 0919   11/28/21 0000  vancomycin (VANCOREADY) IVPB 500 mg/100 mL  Status:  Discontinued        500 mg 100 mL/hr over 60 Minutes Intravenous Every 24 hours 11/27/21 0919 11/29/21 0933   11/27/21 0000  vancomycin (VANCOREADY) IVPB 750 mg/150 mL  Status:  Discontinued  750 mg 150 mL/hr over 60 Minutes Intravenous Every 48 hours 11/25/21 0422 11/27/21 0917   11/26/21 0400  cefTRIAXone (ROCEPHIN) 1 g in sodium chloride 0.9 % 100 mL IVPB  Status:  Discontinued        1 g 200 mL/hr over 30 Minutes Intravenous Every 24 hours 11/25/21 0434 11/25/21 1149   11/25/21 0430  vancomycin (VANCOCIN) IVPB 1000 mg/200 mL premix        1,000 mg 200 mL/hr over 60 Minutes Intravenous  Once 11/25/21 0422 11/25/21 0552   11/25/21 0315  cefTRIAXone (ROCEPHIN) 2 g in sodium chloride 0.9 % 100 mL IVPB        2 g 200 mL/hr over 30 Minutes Intravenous  Once 11/25/21 0311 11/25/21 0435         Subjective: Patient seen and examined.  She has no complaints.  Objective: Vitals:   12/07/21 2214 12/08/21 0435 12/08/21 0500 12/08/21 0933  BP: 105/67 127/78  (!) 143/82  Pulse: 82 86  81  Resp: 16 17  17   Temp: 99.5 F (37.5 C) 99 F (37.2 C)   98 F (36.7 C)  TempSrc: Oral Oral  Oral  SpO2: 95% 98%  99%  Weight:   43.4 kg   Height:        Intake/Output Summary (Last 24 hours) at 12/08/2021 1041 Last data filed at 12/08/2021 0500 Gross per 24 hour  Intake 720 ml  Output --  Net 720 ml    Filed Weights   12/06/21 0500 12/07/21 0500 12/08/21 0500  Weight: 44 kg 44.4 kg 43.4 kg    Examination:  General exam: Appears calm and comfortable, cachectic Respiratory system: Clear to auscultation. Respiratory effort normal. Cardiovascular system: S1 & S2 heard, RRR. No JVD, murmurs, rubs, gallops or clicks. No pedal edema. Gastrointestinal system: Abdomen is nondistended, soft and nontender. No organomegaly or masses felt. Normal bowel sounds heard. Central nervous system: Alert and oriented. No focal neurological deficits. Extremities: Symmetric 5 x 5 power. Skin: No rashes, lesions or ulcers.    Data Reviewed: I have personally reviewed following labs and imaging studies  CBC: Recent Labs  Lab 12/03/21 0104  WBC 5.6  NEUTROABS 4.1  HGB 9.1*  HCT 28.2*  MCV 87.6  PLT 388    Basic Metabolic Panel: Recent Labs  Lab 12/02/21 0436 12/03/21 0104 12/04/21 0151 12/05/21 0104 12/06/21 0120  NA 133* 134* 134* 134* 135  K 3.9 4.1 4.6 4.2 4.4  CL 102 101 104 102 101  CO2 19* 22 22 21* 23  GLUCOSE 90 108* 104* 106* 99  BUN 18 18 18 20  24*  CREATININE 1.31* 1.29* 1.50* 1.24* 1.51*  CALCIUM 8.8* 9.2 9.0 9.3 9.3  MG  --  1.9  --   --   --     GFR: Estimated Creatinine Clearance: 29.2 mL/min (A) (by C-G formula based on SCr of 1.51 mg/dL (H)). Liver Function Tests: No results for input(s): AST, ALT, ALKPHOS, BILITOT, PROT, ALBUMIN in the last 168 hours.  No results for input(s): LIPASE, AMYLASE in the last 168 hours. No results for input(s): AMMONIA in the last 168 hours. Coagulation Profile: No results for input(s): INR, PROTIME in the last 168 hours. Cardiac Enzymes: No results for input(s): CKTOTAL, CKMB,  CKMBINDEX, TROPONINI in the last 168 hours. BNP (last 3 results) No results for input(s): PROBNP in the last 8760 hours. HbA1C: No results for input(s): HGBA1C in the last 72 hours. CBG: No results for input(s): GLUCAP  in the last 168 hours. Lipid Profile: No results for input(s): CHOL, HDL, LDLCALC, TRIG, CHOLHDL, LDLDIRECT in the last 72 hours. Thyroid Function Tests: No results for input(s): TSH, T4TOTAL, FREET4, T3FREE, THYROIDAB in the last 72 hours. Anemia Panel: No results for input(s): VITAMINB12, FOLATE, FERRITIN, TIBC, IRON, RETICCTPCT in the last 72 hours. Sepsis Labs: No results for input(s): PROCALCITON, LATICACIDVEN in the last 168 hours.  Recent Results (from the past 240 hour(s))  Body fluid culture w Gram Stain     Status: None   Collection Time: 12/02/21  9:16 AM   Specimen: PATH Cytology Misc. fluid; Body Fluid  Result Value Ref Range Status   Specimen Description SYNOVIAL  Final   Special Requests RIGHT HIP  Final   Gram Stain NO WBC SEEN NO ORGANISMS SEEN   Final   Culture   Final    NO GROWTH 3 DAYS Performed at [facility], [address], [place], [place] [address]    Report Status 12/05/2021 FINAL  Final      Radiology Studies: No results found.  Scheduled Meds:  amLODipine  5 mg Oral Daily   busPIRone  15 mg Oral BID   carvedilol  3.125 mg Oral BID WC   Chlorhexidine Gluconate Cloth  6 each Topical Q0600   citalopram  10 mg Oral Daily   diclofenac Sodium  2 g Topical QID   enoxaparin (LOVENOX) injection  30 mg Subcutaneous Q24H   feeding supplement  237 mL Oral TID BM   folic acid  1 mg Oral Daily   gabapentin  200 mg Oral TID   multivitamin with minerals  1 tablet Oral Daily   mupirocin ointment   Nasal BID   pantoprazole  40 mg Oral Daily   senna-docusate  2 tablet Oral QHS   thiamine  100 mg Oral Daily   Continuous Infusions:  vancomycin 750 mg (12/07/21 1051)     LOS: 13 days   Time spent: 21 minutes  Darliss Cheney,  MD [facility]  12/08/2021, 10:41 AM  Please page via [facility] and do not message via secure chat for urgent patient care matters. Secure chat can be used for non urgent patient care matters.  How to contact the [facility] Attending or Consulting provider [place] or covering provider during after hours [place], for this patient?  Check the care team in [facility] and look for a) attending/consulting [facility] provider listed and b) the [facility] team listed. Page or secure chat [phone]. Log into [email or web address] and use Why's universal password to access. If you do not have the password, please contact the hospital operator. Locate the [facility] provider you are looking for under [facility] and page to a number that you can be directly reached. If you still have difficulty reaching the provider, please page the [facility] (Director on Call) for the Hospitalists listed on amion for assistance.

## 2021-12-09 DIAGNOSIS — L89159 Pressure ulcer of sacral region, unspecified stage: Secondary | ICD-10-CM | POA: Diagnosis not present

## 2021-12-09 DIAGNOSIS — L03114 Cellulitis of left upper limb: Secondary | ICD-10-CM | POA: Diagnosis not present

## 2021-12-09 DIAGNOSIS — M25552 Pain in left hip: Secondary | ICD-10-CM | POA: Diagnosis not present

## 2021-12-09 DIAGNOSIS — G8929 Other chronic pain: Secondary | ICD-10-CM | POA: Diagnosis not present

## 2021-12-09 LAB — BASIC METABOLIC PANEL
Anion gap: 10 (ref 5–15)
BUN: 45 mg/dL — ABNORMAL HIGH (ref 6–20)
CO2: 22 mmol/L (ref 22–32)
Calcium: 9 mg/dL (ref 8.9–10.3)
Chloride: 102 mmol/L (ref 98–111)
Creatinine, Ser: 1.59 mg/dL — ABNORMAL HIGH (ref 0.44–1.00)
GFR, Estimated: 38 mL/min — ABNORMAL LOW (ref >60)
Glucose, Bld: 110 mg/dL — ABNORMAL HIGH (ref 70–99)
Potassium: 4.5 mmol/L (ref 3.5–5.1)
Sodium: 134 mmol/L — ABNORMAL LOW (ref 135–145)

## 2021-12-09 LAB — CBC WITH DIFFERENTIAL/PLATELET
Abs Immature Granulocytes: 0.14 10*3/uL — ABNORMAL HIGH (ref 0.00–0.07)
Basophils Absolute: 0 10*3/uL (ref 0.0–0.1)
Basophils Relative: 0 %
Eosinophils Absolute: 0.1 10*3/uL (ref 0.0–0.5)
Eosinophils Relative: 3 %
HCT: 26.8 % — ABNORMAL LOW (ref 36.0–46.0)
Hemoglobin: 8.3 g/dL — ABNORMAL LOW (ref 12.0–15.0)
Immature Granulocytes: 3 %
Lymphocytes Relative: 21 %
Lymphs Abs: 1.1 10*3/uL (ref 0.7–4.0)
MCH: 27.5 pg (ref 26.0–34.0)
MCHC: 31 g/dL (ref 30.0–36.0)
MCV: 88.7 fL (ref 80.0–100.0)
Monocytes Absolute: 0.7 10*3/uL (ref 0.1–1.0)
Monocytes Relative: 14 %
Neutro Abs: 3.1 10*3/uL (ref 1.7–7.7)
Neutrophils Relative %: 59 %
Platelets: 402 10*3/uL — ABNORMAL HIGH (ref 150–400)
RBC: 3.02 MIL/uL — ABNORMAL LOW (ref 3.87–5.11)
RDW: 16.7 % — ABNORMAL HIGH (ref 11.5–15.5)
WBC: 5.2 10*3/uL (ref 4.0–10.5)
nRBC: 0 % (ref 0.0–0.2)

## 2021-12-09 LAB — VANCOMYCIN, PEAK: Vancomycin Pk: 34 ug/mL (ref 30–40)

## 2021-12-09 NOTE — Progress Notes (Signed)
[place] PROGRESS NOTE    Sherry Baker  YQI:[id number] DOB: [birth year] DOA: 11/25/2021 PCP: Pcp, No   Brief Narrative:  56 y.o. female with medical history significant for polysubstance abuse, including cocaine use as well as alcohol abuse, type I bipolar disorder, central hypertension, generalized anxiety disorder, stage IIIb chronic kidney disease with associated baseline creatinine 1.3-1.6, chronic anemia with baseline hemoglobin 10-12, who was admitted to [facility] on 11/18/2021 with acute metabolic encephalopathy after presenting from home to [facility] ED for evaluation of altered mental status.  Recently hospitalized from December 22 to January 4 for altered mental status.  At that time also she was on Precedex.  She went into respiratory failure had to be intubated.  Supposed to discharge to skilled nursing facility which the patient eventually declined and went home.  Presented back on 1/16 with altered mental status.  Again placed on Precedex infusion in the ICU.  Was found to have maculopapular rash which became pustular.  Etiology was unclear.  Left AMA from the hospital on 11/23/2021.  Patient return to the ER due to swelling of her hand.  There was also concern that the rash on her back could be from monkey pox.  Patient was hospitalized for further management.  Subsequently noted to have MRSA bacteremia.  Infectious disease was consulted.  S/p TEE which was negative.   Assessment & Plan:   Principal Problem:   Cellulitis of left hand Active Problems:   Severe malnutrition (HCC)   Anemia   Hypertension   Polysubstance abuse (HCC)   Pustular rash   Hyponatremia   CKD (chronic kidney disease), stage III (HCC)   Colostomy in place [facility])   MRSA bacteremia   Right hip pain  Cellulitis of left hand/MRSA bacteremia/history of IVDU- (present on admission).  MRSA bacteremia likely secondary to IVDU. Seen by orthopedics.  Plan is to continue with the antibiotic treatment.  Pain  controlled. -warm soaks. Also seen by ID and they are managing antibiotics.  Currently she is on vancomycin.  Per ID, patient will need IV vancomycin at least for 3 weeks since her recent negative blood culture which was 11/27/2021, 3 weeks the through 12/18/2021 and then they will think about possible oral options.  Unfortunately, due to history of IVDU, patient cannot be discharged with PICC line.  Needs to stay in the hospital to complete at least 10 days of IV vancomycin, patient agreeable with this plan.  Echocardiogram does not show any obvious vegetations.   -TEE negative -repeat BC NGTD   Right hip pain- (present on admission) -x ray negative. Mri suspicious for possible septic joint though known OA also ddx.  Ortho on board.  Underwent aspiration, fluid analysis so far argues against infection.  Ortho recommends THA however also recommends that she needs to be clean for 6 months and healthier.   Colostomy in place [facility]) Seen by ostomy nurse.   CKD (chronic kidney disease), stage III b(HCC)- (present on admission): Renal function at baseline.   Hyponatremia: Mild and improving.  Pustular rash- (present on admission) - pustular rash.  Seen by ID and they feel that this is most likely secondary to MRSA.  Continue contact precautions.  Rash resolved.   Polysubstance abuse ([place])- (present on admission) -known history of IV drug use and polysubstance abuse.   -Urine drug screen positive for benzodiazepine and cocaine -continue to encourage cessation    GERD (gastroesophageal reflux disease) Chronic.  Continue PPI.   Hypertension- (present on admission): Blood  pressure controlled.  Continue amlodipine and Coreg.   Anemia- (present on admission) Chronic for the most part.   -No overt bleeding noted.  Continue to monitor. -transfuse for <7     Severe malnutrition ([place])- (present on admission) Encouraged oral intake.  Nutrition on board.   Anemia of chronic disease: Hemoglobin  stable.  DVT prophylaxis: enoxaparin (LOVENOX) injection 30 mg Start: 11/25/21 1600 SCDs Start: 11/25/21 0434   Code Status: Full Code  Family Communication:  None present at bedside.  Plan of care discussed with patient in length   Status is: Inpatient  Remains inpatient appropriate because: Needs IV antibiotics, history of IVDU, cannot be discharged with IV line.  Estimated body mass index is 17.5 kg/m as calculated from the following:   Height as of this encounter: 5\' 2"  (1.575 m).   Weight as of this encounter: 43.4 kg.  Pressure Injury 10/25/21 Sacrum Medial Stage 3 -  Full thickness tissue loss. Subcutaneous fat may be visible but bone, tendon or muscle are NOT exposed. (Active)  10/25/21 1421  Location: Sacrum  Location Orientation: Medial  Staging: Stage 3 -  Full thickness tissue loss. Subcutaneous fat may be visible but bone, tendon or muscle are NOT exposed.  Wound Description (Comments):   Present on Admission: Yes   Nutritional Assessment: Body mass index is 17.5 kg/m.Marland Kitchen Seen by dietician.  I agree with the assessment and plan as outlined below: Nutrition Status: Nutrition Problem: Severe Malnutrition (in the context of social/environmental circumstances) Etiology:  (lack of nutritient dense foods with polysubstance abuse) . Skin Assessment: I have examined the patient's skin and I agree with the wound assessment as performed by the wound care RN as outlined below: Pressure Injury 10/25/21 Sacrum Medial Stage 3 -  Full thickness tissue loss. Subcutaneous fat may be visible but bone, tendon or muscle are NOT exposed. (Active)  10/25/21 1421  Location: Sacrum  Location Orientation: Medial  Staging: Stage 3 -  Full thickness tissue loss. Subcutaneous fat may be visible but bone, tendon or muscle are NOT exposed.  Wound Description (Comments):   Present on Admission: Yes    Consultants:  Orthopedics ID  Procedures:  As above  Antimicrobials:  Anti-infectives  (From admission, onward)    Start     Dose/Rate Route Frequency Ordered Stop   12/03/21 1000  vancomycin (VANCOREADY) IVPB 750 mg/150 mL        750 mg 150 mL/hr over 60 Minutes Intravenous Every 48 hours 12/02/21 0556     11/30/21 0000  vancomycin (VANCOREADY) IVPB 1250 mg/250 mL  Status:  Discontinued        1,250 mg 166.7 mL/hr over 90 Minutes Intravenous Every 48 hours 11/29/21 0933 11/29/21 0940   11/30/21 0000  vancomycin (VANCOREADY) IVPB 500 mg/100 mL  Status:  Discontinued        500 mg 100 mL/hr over 60 Minutes Intravenous Every 24 hours 11/29/21 0940 12/02/21 0556   11/28/21 1200  vancomycin (VANCOREADY) IVPB 750 mg/150 mL  Status:  Discontinued        750 mg 150 mL/hr over 60 Minutes Intravenous Every 36 hours 11/27/21 0917 11/27/21 0919   11/28/21 0000  vancomycin (VANCOREADY) IVPB 500 mg/100 mL  Status:  Discontinued        500 mg 100 mL/hr over 60 Minutes Intravenous Every 24 hours 11/27/21 0919 11/29/21 0933   11/27/21 0000  vancomycin (VANCOREADY) IVPB 750 mg/150 mL  Status:  Discontinued        750  mg 150 mL/hr over 60 Minutes Intravenous Every 48 hours 11/25/21 0422 11/27/21 0917   11/26/21 0400  cefTRIAXone (ROCEPHIN) 1 g in sodium chloride 0.9 % 100 mL IVPB  Status:  Discontinued        1 g 200 mL/hr over 30 Minutes Intravenous Every 24 hours 11/25/21 0434 11/25/21 1149   11/25/21 0430  vancomycin (VANCOCIN) IVPB 1000 mg/200 mL premix        1,000 mg 200 mL/hr over 60 Minutes Intravenous  Once 11/25/21 0422 11/25/21 0552   11/25/21 0315  cefTRIAXone (ROCEPHIN) 2 g in sodium chloride 0.9 % 100 mL IVPB        2 g 200 mL/hr over 30 Minutes Intravenous  Once 11/25/21 0311 11/25/21 0435         Subjective: Patient seen and examined.  She has no complaints.  She wants to get up and walk and is requesting some assistance.  She was advised to call the nursing staff.  Objective: Vitals:   12/08/21 1648 12/08/21 2004 12/09/21 0424 12/09/21 0926  BP: 119/68 118/70  136/80 113/61  Pulse: 76 79 67 71  Resp: 17 18 18 18   Temp: 98.8 F (37.1 C) (!) 97.3 F (36.3 C) 98.5 F (36.9 C) 98.3 F (36.8 C)  TempSrc: Oral Oral Oral Oral  SpO2: 98% 97% 100% 100%  Weight:      Height:        Intake/Output Summary (Last 24 hours) at 12/09/2021 1030 Last data filed at 12/09/2021 0932 Gross per 24 hour  Intake 480 ml  Output --  Net 480 ml    Filed Weights   12/06/21 0500 12/07/21 0500 12/08/21 0500  Weight: 44 kg 44.4 kg 43.4 kg    Examination:  General exam: Appears calm and comfortable, cachectic Respiratory system: Clear to auscultation. Respiratory effort normal. Cardiovascular system: S1 & S2 heard, RRR. No JVD, murmurs, rubs, gallops or clicks. No pedal edema. Gastrointestinal system: Abdomen is nondistended, soft and nontender. No organomegaly or masses felt. Normal bowel sounds heard. Central nervous system: Alert and oriented. No focal neurological deficits. Extremities: Symmetric 5 x 5 power. Skin: No rashes, lesions or ulcers.  Psychiatry: Judgement and insight appear normal. Mood & affect appropriate.   Data Reviewed: I have personally reviewed following labs and imaging studies  CBC: Recent Labs  Lab 12/03/21 0104 12/09/21 0142  WBC 5.6 5.2  NEUTROABS 4.1 3.1  HGB 9.1* 8.3*  HCT 28.2* 26.8*  MCV 87.6 88.7  PLT 375 402*    Basic Metabolic Panel: Recent Labs  Lab 12/03/21 0104 12/04/21 0151 12/05/21 0104 12/06/21 0120 12/09/21 0142  NA 134* 134* 134* 135 134*  K 4.1 4.6 4.2 4.4 4.5  CL 101 104 102 101 102  CO2 22 22 21* 23 22  GLUCOSE 108* 104* 106* 99 110*  BUN 18 18 20  24* 45*  CREATININE 1.29* 1.50* 1.24* 1.51* 1.59*  CALCIUM 9.2 9.0 9.3 9.3 9.0  MG 1.9  --   --   --   --     GFR: Estimated Creatinine Clearance: 27.7 mL/min (A) (by C-G formula based on SCr of 1.59 mg/dL (H)). Liver Function Tests: No results for input(s): AST, ALT, ALKPHOS, BILITOT, PROT, ALBUMIN in the last 168 hours.  No results for  input(s): LIPASE, AMYLASE in the last 168 hours. No results for input(s): AMMONIA in the last 168 hours. Coagulation Profile: No results for input(s): INR, PROTIME in the last 168 hours. Cardiac Enzymes: No results for  input(s): CKTOTAL, CKMB, CKMBINDEX, TROPONINI in the last 168 hours. BNP (last 3 results) No results for input(s): PROBNP in the last 8760 hours. HbA1C: No results for input(s): HGBA1C in the last 72 hours. CBG: No results for input(s): GLUCAP in the last 168 hours. Lipid Profile: No results for input(s): CHOL, HDL, LDLCALC, TRIG, CHOLHDL, LDLDIRECT in the last 72 hours. Thyroid Function Tests: No results for input(s): TSH, T4TOTAL, FREET4, T3FREE, THYROIDAB in the last 72 hours. Anemia Panel: No results for input(s): VITAMINB12, FOLATE, FERRITIN, TIBC, IRON, RETICCTPCT in the last 72 hours. Sepsis Labs: No results for input(s): PROCALCITON, LATICACIDVEN in the last 168 hours.  Recent Results (from the past 240 hour(s))  Body fluid culture w Gram Stain     Status: None   Collection Time: 12/02/21  9:16 AM   Specimen: PATH Cytology Misc. fluid; Body Fluid  Result Value Ref Range Status   Specimen Description SYNOVIAL  Final   Special Requests RIGHT HIP  Final   Gram Stain NO WBC SEEN NO ORGANISMS SEEN   Final   Culture   Final    NO GROWTH 3 DAYS Performed at [facility], [address] [place], [place] [address]    Report Status 12/05/2021 FINAL  Final      Radiology Studies: No results found.  Scheduled Meds:  amLODipine  5 mg Oral Daily   busPIRone  15 mg Oral BID   carvedilol  3.125 mg Oral BID WC   Chlorhexidine Gluconate Cloth  6 each Topical Q0600   citalopram  10 mg Oral Daily   diclofenac Sodium  2 g Topical QID   enoxaparin (LOVENOX) injection  30 mg Subcutaneous Q24H   feeding supplement  237 mL Oral TID BM   folic acid  1 mg Oral Daily   gabapentin  200 mg Oral TID   multivitamin with minerals  1 tablet Oral Daily   mupirocin  ointment   Nasal BID   pantoprazole  40 mg Oral Daily   senna-docusate  2 tablet Oral QHS   thiamine  100 mg Oral Daily   Continuous Infusions:  vancomycin 750 mg (12/07/21 1051)     LOS: 14 days   Time spent: 21 minutes  Darliss Cheney, MD [facility]  12/09/2021, 10:30 AM  Please page via [place] and do not message via secure chat for urgent patient care matters. Secure chat can be used for non urgent patient care matters.  How to contact the [facility] Attending or Consulting provider [place] or covering provider during after hours [place], for this patient?  Check the care team in [facility] and look for a) attending/consulting TR[facility] provider listed and b) the [facility] team listed. Page or secure chat [phone]. Log into [email or web address] and use [place]'s universal password to access. If you do not have the password, please contact the hospital operator. Locate the [facility], The provider you are looking for under [facility] and page to a number that you can be directly reached. If you still have difficulty reaching the provider, please page the [facility] (Director on Call) for the Hospitalists listed on amion for assistance.

## 2021-12-09 NOTE — Progress Notes (Signed)
[facility] for Infectious Disease    Date of Admission:  11/25/2021   Total days of antibiotics 12 since abtx clearance          ID: Sherry Baker is a 56 y.o. female with complicated MRSA bacteremia Principal Problem:   Cellulitis of left hand Active Problems:   Severe malnutrition (HCC)   Anemia   Hypertension   Polysubstance abuse (HCC)   Pustular rash   Hyponatremia   CKD (chronic kidney disease), stage III (HCC)   Colostomy in place [facility])   MRSA bacteremia   Right hip pain    Subjective: Continues to improve. Cellulitis improving, as is her back lesions. Still having right hip pain. Would like to work with PT  ROS: 12 point ros otherwise negative Medications:   amLODipine  5 mg Oral Daily   busPIRone  15 mg Oral BID   carvedilol  3.125 mg Oral BID WC   Chlorhexidine Gluconate Cloth  6 each Topical Q0600   citalopram  10 mg Oral Daily   diclofenac Sodium  2 g Topical QID   enoxaparin (LOVENOX) injection  30 mg Subcutaneous Q24H   feeding supplement  237 mL Oral TID BM   folic acid  1 mg Oral Daily   gabapentin  200 mg Oral TID   multivitamin with minerals  1 tablet Oral Daily   mupirocin ointment   Nasal BID   pantoprazole  40 mg Oral Daily   senna-docusate  2 tablet Oral QHS   thiamine  100 mg Oral Daily    Objective: Vital signs in last 24 hours: Temp:  [97.3 F (36.3 C)-98.8 F (37.1 C)] 98.3 F (36.8 C) (02/06 0926) Pulse Rate:  [67-79] 71 (02/06 0926) Resp:  [17-18] 18 (02/06 0926) BP: (113-136)/(61-80) 113/61 (02/06 0926) SpO2:  [97 %-100 %] 100 % (02/06 0926) Physical Exam  Constitutional:  oriented to person, place, and time. appears well-developed and well-nourished. No distress.  HENT: /AT, PERRLA, no scleral icterus Mouth/Throat: Oropharynx is clear and moist. No oropharyngeal exudate.  Cardiovascular: Normal rate, regular rhythm and normal heart sounds. Exam reveals no gallop and no friction rub.  No murmur heard.   Pulmonary/Chest: Effort normal and breath sounds normal. No respiratory distress.  has no wheezes.  Neck = supple, no nuchal rigidity Abdominal: Soft. Bowel sounds are normal.  exhibits no distension. There is no tenderness.  Lymphadenopathy: no cervical adenopathy. No axillary adenopathy Neurological: alert and oriented to person, place, and time.  Skin: Skin is warm and dry. No rash noted. No erythema.  Psychiatric: a normal mood and affect.  behavior is normal.    Lab Results Recent Labs    12/09/21 0142  WBC 5.2  HGB 8.3*  HCT 26.8*  NA 134*  K 4.5  CL 102  CO2 22  BUN 45*  CREATININE 1.59*   Liver Panel No results for input(s): PROT, ALBUMIN, AST, ALT, ALKPHOS, BILITOT, BILIDIR, IBILI in the last 72 hours. Sedimentation Rate No results for input(s): ESRSEDRATE in the last 72 hours. C-Reactive Protein No results for input(s): CRP in the last 72 hours.  Microbiology: 1/25 blood cx NGTD Studies/Results: No results found.   Assessment/Plan: Complicated MRSA bacteremia = continues to improve. Tolerating vancomycin. Continue with vancomycin. Planning to treat for 3 wk then transition to orals.-   ? Septic arthritis of right hip = aspirate culture ngtd though had been IV abtx x 7 days prior to aspirate- cellcount incomplete - had 77%N.  Sacral decub = continue with dressing changes  Deconditioning = will have PT evaluate and treat  [facility] for Infectious Diseases Pager: [phone]  12/09/2021, 12:58 PM

## 2021-12-09 NOTE — Progress Notes (Signed)
Mobility Specialist Progress Note:   12/09/21 1100  Mobility  Activity Ambulated with assistance in hallway  Level of Assistance Contact guard assist, steadying assist  Assistive Device Front wheel walker  Distance Ambulated (ft) 120 ft  Activity Response Tolerated well  $Mobility charge 1 Mobility   Pt received in bed willing to participate in mobility. Complaints of 8/10 pain in right hip which limited ambulation. Pt left in bed with call bell in reach and all needs met.   [facility] Public librarian Phone [phone]

## 2021-12-09 NOTE — Progress Notes (Signed)
Brief Nutrition Note  New consult for assessment of nutrition status received 2/5. Pt being followed by RD team with last full assessment completed 2/1.  Pt is well known to nutrition team from previous admissions. Hx of malnutrition. Supplements are in place and importance of nutrition was discussed with pt at last encounter.  Will continue to follow-up with pt throughout her admission as planned.   Ranell Patrick, RD, LDN Clinical Dietitian RD pager # available in [place]  After hours/weekend pager # available in [facility]

## 2021-12-10 DIAGNOSIS — L03114 Cellulitis of left upper limb: Secondary | ICD-10-CM | POA: Diagnosis not present

## 2021-12-10 MED ORDER — METHOCARBAMOL 500 MG PO TABS
500.0000 mg | ORAL_TABLET | Freq: Four times a day (QID) | ORAL | Status: DC
  Administered 2021-12-10 – 2021-12-15 (×20): 500 mg via ORAL
  Filled 2021-12-10 (×20): qty 1

## 2021-12-10 MED ORDER — PREGABALIN 25 MG PO CAPS
25.0000 mg | ORAL_CAPSULE | Freq: Three times a day (TID) | ORAL | Status: DC
  Administered 2021-12-10 – 2021-12-15 (×15): 25 mg via ORAL
  Filled 2021-12-10 (×16): qty 1

## 2021-12-10 MED ORDER — IBUPROFEN 400 MG PO TABS: 400.0000 mg | ORAL_TABLET | Freq: Three times a day (TID) | ORAL | Status: DC

## 2021-12-10 MED ORDER — HYDROMORPHONE HCL 1 MG/ML IJ SOLN
0.5000 mg | INTRAMUSCULAR | Status: DC | PRN
  Administered 2021-12-12 – 2021-12-15 (×12): 0.5 mg via INTRAVENOUS
  Filled 2021-12-10 (×13): qty 0.5

## 2021-12-10 MED ORDER — OXYCODONE HCL 5 MG PO TABS
5.0000 mg | ORAL_TABLET | Freq: Four times a day (QID) | ORAL | Status: DC | PRN
  Administered 2021-12-10 – 2021-12-11 (×4): 5 mg via ORAL
  Filled 2021-12-10 (×4): qty 1

## 2021-12-10 NOTE — Evaluation (Addendum)
Physical Therapy Evaluation Patient Details Name: Sherry Baker MRN: [id number] DOB: 1966/09/25 Today's Date: 12/10/2021  History of Present Illness  Pt is a 56 y.o. female initially admitted 11/18/21 with acute metabolic encephalopathy, left AMA 1/21, returned to ED 11/25/21 due to L hand swelling. 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 Pt also with R hip pain; MRI suspicious for septic joint s/p aspiration; recommended THA. PMH includes CKD, colostomy, HTN, polysubstance abuse, malnutrition.   Clinical Impression  Pt presents with an overall decrease in functional mobility secondary to above. PTA, pt reports mod indep with RW, lives with two roommates, uses public transportation since none drive. Today, pt moving well with RW and intermittent min guard for balance for ambulation; mobility limited by R hip pain. Pt would benefit from continued acute PT services to maximize functional mobility and independence prior to d/c home.      Recommendations for follow up therapy are one component of a multi-disciplinary discharge planning process, led by the attending physician.  Recommendations may be updated based on patient status, additional functional criteria and insurance authorization.  Follow Up Recommendations No PT follow up     Assistance Recommended at Discharge Intermittent Supervision/Assistance  Patient can return home with the following  A little help with bathing/dressing/bathroom;Assist for transportation;Help with stairs or ramp for entrance    Equipment Recommendations None recommended by PT  Recommendations for Other Services       Functional Status Assessment Patient has had a recent decline in their functional status and demonstrates the ability to make significant improvements in function in a reasonable and predictable amount of time.     Precautions / Restrictions Precautions Precautions: Fall;Other (comment) Precaution Comments:  colostomy Restrictions Weight Bearing Restrictions: No      Mobility  Bed Mobility Overal bed mobility: Independent                  Transfers Overall transfer level: Modified independent Equipment used: None, Rolling walker (2 wheels) Transfers: Sit to/from Stand, Bed to chair/wheelchair/BSC Sit to Stand: Modified independent (Device/Increase time) Stand pivot transfers: Modified independent (Device/Increase time) Step pivot transfers: Modified independent (Device/Increase time)       General transfer comment: Pt performing stand pivot from bed<>BSC mod indep without DME; also mod indep to stand from EOB to RW    Ambulation/Gait Ambulation/Gait assistance: Min guard Gait Distance (Feet): 140 Feet Assistive device: Rolling walker (2 wheels) Gait Pattern/deviations: Step-to pattern, Decreased weight shift to right, Antalgic, Trunk flexed Gait velocity: Decreased     General Gait Details: slow, antalgic gait with RW and intermittent min guard for balance; cues to maintain closer proximity to RW in order to offload painful RLE; pt declines use of shorter walker  Stairs            Wheelchair Mobility    Modified Rankin (Stroke Patients Only)       Balance Overall balance assessment: Needs assistance Sitting-balance support: Feet supported Sitting balance-Leahy Scale: Good Sitting balance - Comments: indep with pericare sitting on BSC   Standing balance support: Bilateral upper extremity supported, During functional activity, No upper extremity supported Standing balance-Leahy Scale: Fair Standing balance comment: can static stand without UE support; need for RW to offload painful R hip with ambulation                             Pertinent Vitals/Pain Pain Assessment Pain Assessment: Faces Faces  Pain Scale: Hurts even more Pain Location: R hip Pain Descriptors / Indicators: Grimacing, Guarding Pain Intervention(s): Limited activity within  patient's tolerance, Other (comment) (pt aware she is due for pain meds at 10A)    [place] expects to be discharged to:: Private residence Living Arrangements: Non-relatives/Friends Available Help at Discharge: Friend(s);Available PRN/intermittently Type of Home: Apartment Home Access: Level entry       Home Layout: One level Home Equipment: Conservation officer, nature (2 wheels);Rollator (4 wheels) Additional Comments: Reports living with two female roommates who are able to assist as much as needed; pt and roommates do not drive, rely on public transportation    Prior Function Prior Level of Function : Independent/Modified Independent             Mobility Comments: Pt reports typically mod indep ambulating with walker ADLs Comments: Pt reports indep with ADLs. Roommates assist with household tasks as needed     Hand Dominance   Dominant Hand: Right    Extremity/Trunk Assessment   Upper Extremity Assessment Upper Extremity Assessment: Generalized weakness    Lower Extremity Assessment Lower Extremity Assessment: Generalized weakness;RLE deficits/detail RLE Deficits / Details: R hip OA (per ortho, need for THA) - audible hollow/clicking sound in R hip with ambulation       Communication   Communication: No difficulties  Cognition Arousal/Alertness: Awake/alert Behavior During Therapy: WFL for tasks assessed/performed Overall Cognitive Status: No family/caregiver present to determine baseline cognitive functioning                                 General Comments: cognition WFL for simple tasks, not formally assessed. suspect some decreased attention and repetition in conversation        General Comments General comments (skin integrity, edema, etc.): discussed d/c planning and DME needs - educ on likely benefit of RW to offload painful hip as rollator likely to increase fall risk if being used like a 'crutch', but can trial rollator next session  if pt prefers. pt declines need for HHPT services, declines need for BSC/3in1    Exercises     Assessment/Plan    PT Assessment Patient needs continued PT services  PT Problem List Decreased strength;Decreased mobility;Decreased activity tolerance;Decreased balance;Decreased knowledge of use of DME;Pain       PT Treatment Interventions Gait training;Balance training;Functional mobility training;Stair training;Therapeutic activities;Patient/family education;DME instruction;Therapeutic exercise    PT Goals (Current goals can be found in the Care Plan section)  Acute Rehab PT Goals Patient Stated Goal: return home PT Goal Formulation: With patient Time For Goal Achievement: 12/24/21 Potential to Achieve Goals: Good    Frequency Min 2X/week     Co-evaluation               AM-PAC PT "6 Clicks" Mobility  Outcome Measure Help needed turning from your back to your side while in a flat bed without using bedrails?: None Help needed moving from lying on your back to sitting on the side of a flat bed without using bedrails?: None Help needed moving to and from a bed to a chair (including a wheelchair)?: None Help needed standing up from a chair using your arms (e.g., wheelchair or bedside chair)?: None Help needed to walk in hospital room?: A Little Help needed climbing 3-5 steps with a railing? : A Little 6 Click Score: 22    End of Session Equipment Utilized During Treatment: Gait belt Activity Tolerance:  Patient tolerated treatment well Patient left: in bed;with call bell/phone within reach Nurse Communication: Mobility status PT Visit Diagnosis: Other abnormalities of gait and mobility (R26.89);Pain Pain - Right/Left: Right Pain - part of body: Hip    Time: 4536-4680 PT Time Calculation (min) (ACUTE ONLY): 16 min   Charges:   PT Evaluation $PT Eval Moderate Complexity: [place], PT, DPT Acute Rehabilitation Services  Pager  [phone] Office [place] 12/10/2021, 12:31 PM

## 2021-12-10 NOTE — Progress Notes (Addendum)
Mobility Specialist Progress Note:   12/10/21 1405  Mobility  Activity Ambulated with assistance in room  Level of Assistance Standby assist, set-up cues, supervision of patient - no hands on  Assistive Device Front wheel walker  Distance Ambulated (ft) 60 ft  Activity Response Tolerated well  $Mobility charge 1 Mobility   Pt received in bed willing to ambulate. Complaints of R hip pain that limited pt to room ambulation. Left pt in bed with call bell in reach and all needs met.   [facility] Public librarian Phone [phone]

## 2021-12-10 NOTE — Plan of Care (Signed)
  Problem: Education: Goal: Knowledge of General Education information will improve Description: Including pain rating scale, medication(s)/side effects and non-pharmacologic comfort measures Outcome: Progressing   Problem: Clinical Measurements: Goal: Ability to maintain clinical measurements within normal limits will improve Outcome: Progressing   

## 2021-12-10 NOTE — Progress Notes (Signed)
Patient was added to DTP list for discharge planning. Patient familiar to DTP staff as she was previously on list during another admission.  Madilyn Fireman, MSW, LCSW Transitions of Care   Clinical Social Worker II [phone]

## 2021-12-10 NOTE — TOC Progression Note (Signed)
Transition of Care [facility]) - Progression Note    Patient Details  Name: Sherry Baker MRN: [id number] Date of Birth: November 17, 1965  Transition of Care [facility]) CM/SW [place], RN Phone Number: 12/10/2021, 2:05 PM  Clinical Narrative:    CM made notation in chart for the patient to schedule a follow up appointment with the [facility] since the patient has history of noncompliance with clinic visits.  Also, I left discharge instructions regarding outpatient follow up information in regarding to substance abuse counseling since patient with active use of cocaine prior to admission.  The patient will be discharged home after IV antibiotics completed after 3 weeks - end date 12/18/2021 per ID physician note.  CM and MSW with DTP Team will continue to follow for discharge needs for home.   Expected Discharge Plan: Home/Self Care Barriers to Discharge: Continued Medical Work up  Expected Discharge Plan and Services Expected Discharge Plan: Home/Self Care In-house Referral: Clinical Social Work Discharge Planning Services: CM Consult, [facility] (Patient to schedule [facility] appointment since note in chart stating pt with history of "no show" for clinic appointments.)   Living arrangements for the past 2 months: Apartment                                       Social Determinants of Health (SDOH) Interventions    Readmission Risk Interventions Readmission Risk Prevention Plan 12/10/2021 11/15/2021 09/30/2021  Transportation Screening Complete Complete Complete  Medication Review Press photographer) Complete Complete Complete  PCP or Specialist appointment within 3-5 days of discharge Complete Complete Complete  HRI or Home Care Consult Not Complete - Not Complete  HRI or Home Care Consult Pt Refusal Comments history of unsafe home environment - -  SW Recovery Care/Counseling Consult Complete - Complete  Palliative Care Screening Not  Applicable Not Applicable Not [place] Not Applicable Not Applicable Not Applicable  Some recent data might be hidden

## 2021-12-10 NOTE — Progress Notes (Addendum)
Progress Note   Patient: Sherry Baker [id number]:[id number] DOB: [birth year] DOA: 11/25/2021     15 DOS: the patient was seen and examined on 12/10/2021   Brief hospital course: 56 y.o. female with medical history significant for polysubstance abuse, including cocaine use as well as alcohol abuse, type I bipolar disorder, central hypertension, generalized anxiety disorder, stage IIIb chronic kidney disease with associated baseline creatinine 1.3-1.6, chronic anemia with baseline hemoglobin 10-12, who was admitted to [facility] on 11/18/2021 with acute metabolic encephalopathy after presenting from home to [facility] ED for evaluation of altered mental status.  Recently hospitalized from December 22 to January 4 for altered mental status.  At that time also she was on Precedex.  She went into respiratory failure had to be intubated.  Supposed to discharge to skilled nursing facility which the patient eventually declined and went home.  Presented back on 1/16 with altered mental status.  Again placed on Precedex infusion in the ICU.  Was found to have maculopapular rash which became pustular.  Etiology was unclear.  Left AMA from the hospital on 11/23/2021.  Patient return to the ER due to swelling of her hand.  There was also concern that the rash on her back could be from monkey [place].  Patient was hospitalized for further management.  Subsequently noted to have MRSA bacteremia.  Infectious disease was consulted.  Assessment and Plan: * Cellulitis of left hand- (present on admission) Plain films did not show any air in the soft tissue but soft tissue swelling was noted.  No obvious abscess noted on examination.  -IV abx -elevate extremity - Seen by orthopedics.  Plan is to continue with the antibiotic treatment.  Pain control. -warm soaks with Hibiclens   MRSA bacteremia w/ associated pustular rash- (present on admission) -Likely from IVDU.  -infectious disease following.   -Echocardiogram does not show any  obvious vegetations.   -TEE negative -repeat BC NGTD -if she spikes another temperature, plan to repeat blood cultures  Right hip pain- (present on admission) -x ray negative -check MRI for septic joint/abscess -- ordered and pending   CKD (chronic kidney disease), stage III ([place])- (present on admission) Chronic kidney disease stage IIIb Hyponatremia Renal function noted to be close to baseline.  Monitor urine output.  Avoid nephrotoxic agents.     Hypertension- (present on admission) Resume amlodipine and carvedilol.  Hyponatremia -stable low -continue to monitor  Anemia- (present on admission) Chronic for the most part.  Slight drop in hemoglobin is likely dilutional.   -No overt bleeding noted.  Continue to monitor. -transfuse for <7    Severe malnutrition ([place])- (present on admission) Encourage oral intake.  Polysubstance abuse ([place])- (present on admission) -known history of IV drug use and polysubstance abuse.   -Urine drug screen positive for benzodiazepine and cocaine -continue to encourage cessation   Colostomy in place [facility]) Seen by ostomy nurse.  Pustular rash - pustular rash.  Differential diagnosis does include monkeypox.  Seen by ID and they feel that this is most likely secondary to MRSA.  Continue contact precautions.  Lesions appear to be improving.   GERD (gastroesophageal reflux disease) Chronic.        Subjective: Alert and without any specific complaints other than right hip and thigh discomfort that appears to increase with mobilization  Physical Exam: Vitals:   12/09/21 0926 12/09/21 1600 12/09/21 2007 12/10/21 0300  BP: 113/61 121/72 119/68 109/68  Pulse: 71 79 72 63  Resp: 18 18 18  18  Temp: 98.3 F (36.8 C) 98.4 F (36.9 C) 98.5 F (36.9 C) 98.3 F (36.8 C)  TempSrc: Oral Oral Oral Oral  SpO2: 100% 98% 98% 99%  Weight:    44.6 kg  Height:       General: Alert and oriented and only mild distress as evidenced by ongoing hip  pain Pulmonary: History lung sounds are clear to auscultation, stable on room air with normal work of breathing Cardiac: S1-S2, slightly suboptimal BP readings, no peripheral edema, regular pulse Abdomen: Scaphoid and nontender.  Bowel sounds present.  LBM 2/5 Extremities: Symmetrical in appearance without any obvious effusions or joint erythema Skin: Multiple maculopapular lesions throughout the back diffusely that are in the healing stages without drainage or red Neurological: Radial nerves II through XII grossly intact, strength throughout 4/5.  Sensation intact Psych: Alert and oriented x3  Data Reviewed:  There are no new results to review at this time.   DVT Prophylaxis  ., Scds  Enoxaparin (lovenox) injection 30 mg    Family Communication: Patient only  Disposition: Remains inpatient appropriate because:  Unsafe discharged plan; patient known IVDU and not an appropriate candidate for home IV antibiotics via PICC line.  Patient also with acute on chronic hip pain worse on the right that is affecting mobility and causing acute ambulatory dysfunction  Planned Discharge Destination: Home  COVID vaccination status: Moderna 09/30/2021  Consultants: Psychiatry Infectious disease Orthopedic team Procedures: Echocardiogram TEE DG guided fluoroscopic right hip aspiration and injection Antibiotics: Ceftriaxone 1/22 Vancomycin 1/22 Vancomycin 1/24 >> note having alternate day administration due to low creatinine clearance     Time spent: 25 minutes  Author: Erin Hearing, NP 12/10/2021 8:17 AM  For on call review [email or web address].

## 2021-12-11 DIAGNOSIS — L03114 Cellulitis of left upper limb: Secondary | ICD-10-CM | POA: Diagnosis not present

## 2021-12-11 LAB — BASIC METABOLIC PANEL
Anion gap: 10 (ref 5–15)
BUN: 41 mg/dL — ABNORMAL HIGH (ref 6–20)
CO2: 24 mmol/L (ref 22–32)
Calcium: 9 mg/dL (ref 8.9–10.3)
Chloride: 100 mmol/L (ref 98–111)
Creatinine, Ser: 1.39 mg/dL — ABNORMAL HIGH (ref 0.44–1.00)
GFR, Estimated: 45 mL/min — ABNORMAL LOW (ref >60)
Glucose, Bld: 91 mg/dL (ref 70–99)
Potassium: 4.5 mmol/L (ref 3.5–5.1)
Sodium: 134 mmol/L — ABNORMAL LOW (ref 135–145)

## 2021-12-11 LAB — VANCOMYCIN, TROUGH: Vancomycin Tr: 10 ug/mL — ABNORMAL LOW (ref 15–20)

## 2021-12-11 MED ORDER — ACETAMINOPHEN 325 MG PO TABS
650.0000 mg | ORAL_TABLET | Freq: Two times a day (BID) | ORAL | Status: DC
  Administered 2021-12-11 – 2021-12-15 (×9): 650 mg via ORAL
  Filled 2021-12-11 (×9): qty 2

## 2021-12-11 MED ORDER — OXYCODONE HCL 5 MG PO TABS
5.0000 mg | ORAL_TABLET | ORAL | Status: DC
  Administered 2021-12-11 – 2021-12-15 (×24): 5 mg via ORAL
  Filled 2021-12-11 (×25): qty 1

## 2021-12-11 NOTE — TOC Progression Note (Signed)
Transition of Care [facility]) - Progression Note    Patient Details  Name: MALISSA SLAY MRN: [id number] Date of Birth: [birth year]  [facility] CM/SW Contact  Curlene Labrum, RN Phone Number: 12/11/2021, 8:32 AM  Clinical Narrative:    CM placed referral for Outpatient PT/OT.  Discharge instructions included for patient to follow up with clinic to schedule for outpatient therapy.  CM and MSW with DTP Team will continue to follow for discharge to home once IV antibiotics completed her ID MD recommendations.   Expected Discharge Plan: Home/Self Care Barriers to Discharge: Continued Medical Work up  Expected Discharge Plan and Services Expected Discharge Plan: Home/Self Care In-house Referral: Clinical Social Work Discharge Planning Services: CM Consult, [facility] (Patient to schedule [facility] appointment since note in chart stating pt with history of "no show" for clinic appointments.)   Living arrangements for the past 2 months: Apartment                                       Social Determinants of Health (SDOH) Interventions    Readmission Risk Interventions Readmission Risk Prevention Plan 12/10/2021 11/15/2021 09/30/2021  Transportation Screening Complete Complete Complete  Medication Review Press photographer) Complete Complete Complete  PCP or Specialist appointment within 3-5 days of discharge Complete Complete Complete  HRI or Home Care Consult Not Complete - Not Complete  HRI or Home Care Consult Pt Refusal Comments history of unsafe home environment - -  SW Recovery Care/Counseling Consult Complete - Complete  Palliative Care Screening Not Applicable Not Applicable Not [place] Not Applicable Not Applicable Not Applicable  Some recent data might be hidden

## 2021-12-11 NOTE — Progress Notes (Signed)
Mobility Specialist Progress Note:   12/11/21 1039  Mobility  Activity Ambulated with assistance in hallway  Level of Assistance Contact guard assist, steadying assist  Assistive Device Front wheel walker  Distance Ambulated (ft) 150 ft  Activity Response Tolerated well  $Mobility charge 1 Mobility   Pt received in bed willing to participate in mobility. Complaints of hip pain. Pt left EOB with call bell in reach and all needs met.   [facility] Public librarian Phone [phone]

## 2021-12-11 NOTE — Progress Notes (Addendum)
Progress Note   Patient: Sherry Baker UXL:[id number] DOB: [birth year] DOA: 11/25/2021     16 DOS: the patient was seen and examined on 12/11/2021   Brief hospital course: 56 y.o. female with medical history significant for polysubstance abuse, including cocaine use as well as alcohol abuse, type I bipolar disorder, central hypertension, generalized anxiety disorder, stage IIIb chronic kidney disease with associated baseline creatinine 1.3-1.6, chronic anemia with baseline hemoglobin 10-12, who was admitted to [facility] on 11/18/2021 with acute metabolic encephalopathy after presenting from home to [facility] ED for evaluation of altered mental status.  Recently hospitalized from December 22 to January 4 for altered mental status.  At that time also she was on Precedex.  She went into respiratory failure had to be intubated.  Supposed to discharge to skilled nursing facility which the patient eventually declined and went home.  Presented back on 1/16 with altered mental status.  Again placed on Precedex infusion in the ICU.  Was found to have maculopapular rash which became pustular.  Etiology was unclear.  Left AMA from the hospital on 11/23/2021.  Patient return to the ER due to swelling of her hand.  There was also concern that the rash on her back could be from monkey Burke.  Patient was hospitalized for further management.  Subsequently noted to have MRSA bacteremia.  Infectious disease was consulted.  Assessment and Plan: * Cellulitis of left hand- (present on admission) Plain films did not show any air in the soft tissue but soft tissue swelling was noted.  No obvious abscess noted on examination.  -IV abx -elevate extremity - Seen by orthopedics.  Plan is to continue with the antibiotic treatment.  Pain control. -warm soaks with Hibiclens   MRSA bacteremia w/ associated pustular rash- (present on admission) -Likely from IVDU.  -ID following.   -Echocardiogram does not show any obvious  vegetations.   -TEE negative -repeat BC NGTD -if she spikes another temperature, plan to repeat blood cultures  Right hip pain causing ambulatory dysfunction- (present on admission) -x ray negative but MRI revealed degenerative changes of bilateral hips with a small to moderate-sized right hip joint effusion likely degenerative and reactive less likely septic in etiology; there was also intramuscular edema within the bilateral iliac [place] musculature within the adductor compartments of the bilateral hips right worse than left -Hip pain influencing mobility and patient is currently requiring a walker for mobilization and continues to point hip pain especially on the right or right lateral thigh -Continue scheduled Lyrica 25 mg 3 times daily, scheduled Robaxin and as needed Oxy IR but will increase frequency to every 4 hours prn -Continue to allow for very low-dose IV Dilaudid for breakthrough pain -Add scheduled Tylenol every 12 hours -GFR too low to allow for NSAIDs -Plan outpatient PT once discharged -Evaluated by orthopedic team and status post fluoroscopy guided needle aspiration of fluid collection demonstrated no growth x3 days   CKD (chronic kidney disease), stage III ([place])- (present on admission) Chronic kidney disease stage IIIb Hyponatremia Renal function at usual baseline Sodium stable between 134 and 135 2/8 BUN 41,  creatinine of 1.39 -no signs of GI bleeding to explain elevated BUN so could be more indicative of volume depletion -Continue to follow-note she remains 6 L positive   Hypertension- (present on admission) SBP in the 100s this morning 2/7 therefore Norvasc DC'd Continue carvedilol As of 2/8 BP remains stable off of Norvasc  Hyponatremia -stable low -continue to monitor  Anemia- (present  on admission) Chronic for the most part.  Slight drop in hemoglobin is likely dilutional.   -No overt bleeding noted.  Continue to monitor. -transfuse for <7    Severe  malnutrition ([place])- (present on admission) Encourage oral intake. Continue Ensure supplementation as well as mineral replacement Estimated body mass index is 18 kg/m as calculated from the following:   Height as of this encounter: 5\' 2"  (1.575 m).   Weight as of this encounter: 44.6 kg.  Polysubstance abuse with associated depression and anxiety- (present on admission) -known history of IV drug use and polysubstance abuse.   -Urine drug screen positive for benzodiazepine and cocaine -continue to encourage cessation  Continue BuSpar and Celexa  Colostomy in place [facility]) Seen by ostomy nurse.  Pustular rash - pustular rash.  Differential diagnosis does include monkeypox.  Seen by ID and they feel that this is most likely secondary to MRSA.  Continue contact precautions.  Lesions appear to be improving.   GERD (gastroesophageal reflux disease) Chronic.        Subjective:  Alert-states hip pain improved but not resolved.  Discussed with her that she has severe bilateral hip arthritis and she will never be completely pain-free.  Physical Exam: Vitals:   12/10/21 2107 12/11/21 0457 12/11/21 0459 12/11/21 0747  BP: 126/78 125/77  107/77  Pulse: 88 79  82  Resp: 18 18  16   Temp: 98.4 F (36.9 C) 98.1 F (36.7 C)  97.6 F (36.4 C)  TempSrc: Oral Oral  Oral  SpO2: 99% 97%  98%  Weight:   46.4 kg   Height:       General: Calm, no acute distress Pulmonary: History lung sounds are clear to auscultation, stable on room air with normal work of breathing Cardiac: S1-S2, slightly suboptimal BP readings, no peripheral edema, regular pulse Abdomen: Scaphoid and nontender.  Bowel sounds present.  LBM 2/8 Extremities: Symmetrical in appearance without any obvious effusions or joint erythema Skin: Multiple maculopapular lesions throughout the back diffusely that are in the healing stages without drainage or red Neurological: Radial nerves II through XII grossly intact, strength  throughout 4/5.  Sensation intact Psych: Alert and oriented x3  Data Reviewed: There are no new results to review at this time.   DVT Prophylaxis   ., Scds  Enoxaparin (lovenox) injection 30 mg    Family Communication:  Patient only  Disposition: Remains inpatient appropriate because:  Unsafe discharged plan; patient known IVDU and not an appropriate candidate for home IV antibiotics via PICC line.  Patient also with acute on chronic hip pain worse on the right that is affecting mobility and causing acute ambulatory dysfunction  Planned Discharge Destination:  Home  COVID vaccination status:  Moderna 09/30/2021  Consultants: Psychiatry Infectious disease Orthopedic team Procedures: Echocardiogram TEE DG guided fluoroscopic right hip aspiration and injection Antibiotics: Ceftriaxone 1/22 Vancomycin 1/22 Vancomycin 1/24 >> note having alternate day administration due to low creatinine clearance     Time spent: 25 minutes  Author: Erin Hearing, NP 12/11/2021 12:07 PM  For on call review [email or web address].

## 2021-12-11 NOTE — Progress Notes (Signed)
Nutrition Follow-up  DOCUMENTATION CODES:  Underweight, Severe malnutrition in context of social or environmental circumstances  INTERVENTION:  Continue current diet as ordered, encourage PO intake MVI with minerals, continue thiamine and folic acid Ensure Enlive po TID, each supplement provides 350 kcal and 20 grams of protein.  NUTRITION DIAGNOSIS:  Severe Malnutrition (in the context of social/environmental circumstances) related to  (lack of nutritient dense foods with polysubstance abuse) as evidenced by severe fat depletion, severe muscle depletion.  GOAL:  Patient will meet greater than or equal to 90% of their needs  MONITOR:  Supplement acceptance, PO intake, Labs, Weight trends  REASON FOR ASSESSMENT:  LOS, Other (Comment) (low BMI)    ASSESSMENT:  56 yo female admitted with hx of cocaine use, alcohol abuse, Bipolar disorder, HTN, GAD, CKD stage IIIB, chronic anemia, liver cirrhosis, rectal cancer 2018 s/p colostomy placement, and malnutrition admitted with swelling of her hand after leaving AMA the day prior (1/21)  1/26 - TEE (no vegetation, normal EF)  Pt resting in bed at the time of assessment. Pt reports that she is about to eat lunch and her appetite has been great this week. Discussed slight weight gain since last assessment, pt seems proud of her progress. Encouraged her to continue eating well and drinking supplements.   Average Meal Intake: 1/24-1/31: 48% average intake x 6 recorded meals 2/1-2/8: 72% average intake x 10 recorded meals  Nutritionally Relevant Medications: Scheduled Meds:  Ensure Enlive  237 mL Oral TID BM   folic acid  1 mg Oral Daily   multivitamin with minerals  1 tablet Oral Daily   mupirocin ointment   Nasal BID   pantoprazole  40 mg Oral Daily   senna-docusate  2 tablet Oral QHS   thiamine  100 mg Oral Daily   Continuous Infusions:  vancomycin 750 mg (12/09/21 1051)   PRN Meds: ondansetron, polyethylene glycol  Labs  Reviewed: Sodium 134 (2/6) BUN 45, creatinine 1.59 (2/6)  NUTRITION - FOCUSED PHYSICAL EXAM: Flowsheet Row Most Recent Value  Orbital Region Severe depletion  Upper Arm Region Severe depletion  Thoracic and Lumbar Region Severe depletion  Buccal Region Severe depletion  Temple Region Severe depletion  Clavicle Bone Region Severe depletion  Clavicle and Acromion Bone Region Severe depletion  Scapular Bone Region Severe depletion  Dorsal Hand Severe depletion  Patellar Region Severe depletion  Anterior Thigh Region Severe depletion  Posterior Calf Region Severe depletion  Edema (RD Assessment) None  Hair Reviewed  Eyes Reviewed  Mouth Reviewed  [poor dentition]  Skin Reviewed  Nails Reviewed   Diet Order:   Diet Order             Diet regular Room service appropriate? Yes; Fluid consistency: Thin  Diet effective now                   EDUCATION NEEDS:  No education needs have been identified at this time  Skin:  Skin Assessment: Reviewed RN Assessment (stage 3 to the sacrum)  Last BM:  2/7  Height:  Ht Readings from Last 1 Encounters:  11/25/21 5\' 2"  (1.575 m)   Weight:  Wt Readings from Last 1 Encounters:  12/11/21 46.4 kg    Ideal Body Weight:  50 kg  BMI:  Body mass index is 18.71 kg/m.  Estimated Nutritional Needs:  Kcal:  1800-2000 kcal/d Protein:  90-100g/d Fluid:  1.8-2 L/d   Ranell Patrick, RD, LDN Clinical Dietitian RD pager # available in [facility]  After hours/weekend pager # available in [facility]

## 2021-12-11 NOTE — Progress Notes (Signed)
Pharmacy Antibiotic Note  Sherry Baker is a 56 y.o. female admitted on 02/17/3844 with complicated MRSA bacteremia.  Pharmacy has been consulted for vancomycin dosing. TEE negative.   Vancomycin AUC therapeutic at 475 (goal 400-550) post dose reduction.  SCr stable in 1.2-1.5 range.  Afebrile, WBC WNL.  Plan: Continue vanc 750mg  IV Q48H  Monitor renal fxn, clinical progress, weekly vanc levels F/U with transitioning to oral abx   Height: 5\' 2"  (157.5 cm) Weight: 46.4 kg (102 lb 4.7 oz) IBW/kg (Calculated) : 50.1  Temp (24hrs), Avg:98.1 F (36.7 C), Min:97.6 F (36.4 C), Max:98.4 F (36.9 C)  Recent Labs  Lab 12/05/21 0104 12/06/21 0120 12/09/21 0142 12/09/21 1352 12/11/21 0821  WBC  --   --  5.2  --   --   CREATININE 1.24* 1.51* 1.59*  --  1.39*  VANCOTROUGH  --   --   --   --  10*  VANCOPEAK  --   --   --  34  --      Estimated Creatinine Clearance: 33.9 mL/min (A) (by C-G formula based on SCr of 1.39 mg/dL (H)).    Allergies  Allergen Reactions   Penicillins Hives and Swelling    Has patient had a PCN reaction causing immediate rash, facial/tongue/throat swelling, SOB or lightheadedness with hypotension: Yes Has patient had a PCN reaction causing severe rash involving mucus membranes or skin necrosis: No Has patient had a PCN reaction that required hospitalization No Has patient had a PCN reaction occurring within the last 10 years: Yes If all of the above answers are "NO", then may proceed with Cephalosporin use.     CTX 1/23 >> 1/23 Vanc 1/23 >>   1/30 VP/VT 38/13, AUC high 592 > decr 750 q48 2/6-2/8 VP 34, VT 10, AUC 475 > no change   1/23 BCx - MRSA 1/25 BCx - negative 1/23 VZV - negative 1/30 R hip synovial fluid cx - negative  Tobechukwu Emmick D. Mina Marble, PharmD, BCPS, [place] 12/11/2021, 9:51 AM

## 2021-12-12 DIAGNOSIS — M25552 Pain in left hip: Secondary | ICD-10-CM | POA: Diagnosis not present

## 2021-12-12 DIAGNOSIS — L89159 Pressure ulcer of sacral region, unspecified stage: Secondary | ICD-10-CM | POA: Diagnosis not present

## 2021-12-12 DIAGNOSIS — L03114 Cellulitis of left upper limb: Secondary | ICD-10-CM | POA: Diagnosis not present

## 2021-12-12 DIAGNOSIS — G8929 Other chronic pain: Secondary | ICD-10-CM | POA: Diagnosis not present

## 2021-12-12 NOTE — Progress Notes (Signed)
[place] for Infectious Disease    Date of Admission:  11/25/2021   Total days of antibiotics 16 since bacteremia clearance          ID: Sherry Baker is a 56 y.o. female with  disseminated MRSA infection/complicated bacteremia Principal Problem:   Cellulitis of left hand Active Problems:   Severe malnutrition (HCC)   Anemia   Hypertension   Polysubstance abuse with associated depression and anxiety   Hyponatremia   CKD (chronic kidney disease), stage III (HCC)   Colostomy in place [facility])   MRSA bacteremia w/ associated pustular rash   Right hip pain causing ambulatory dysfunction    Subjective: Afebrile. Still having right hip pain difficulty ambulating in room. Better with pain medication  Medications:   acetaminophen  650 mg Oral Q12H   busPIRone  15 mg Oral BID   carvedilol  3.125 mg Oral BID WC   Chlorhexidine Gluconate Cloth  6 each Topical Q0600   citalopram  10 mg Oral Daily   diclofenac Sodium  2 g Topical QID   enoxaparin (LOVENOX) injection  30 mg Subcutaneous Q24H   feeding supplement  237 mL Oral TID BM   folic acid  1 mg Oral Daily   methocarbamol  500 mg Oral QID   multivitamin with minerals  1 tablet Oral Daily   mupirocin ointment   Nasal BID   oxyCODONE  5 mg Oral Q4H   pantoprazole  40 mg Oral Daily   pregabalin  25 mg Oral TID   senna-docusate  2 tablet Oral QHS   thiamine  100 mg Oral Daily    Objective: Vital signs in last 24 hours: Temp:  [97.7 F (36.5 C)-98.2 F (36.8 C)] 98.2 F (36.8 C) (02/09 1326) Pulse Rate:  [71-76] 72 (02/09 1326) Resp:  [16-18] 16 (02/09 1326) BP: (105-136)/(68-76) 136/76 (02/09 1326) SpO2:  [98 %-100 %] 100 % (02/09 1326) Physical Exam  Constitutional:  oriented to person, place, and time. appears well-developed and well-nourished. No distress.  HENT: [place]/AT, PERRLA, no scleral icterus Mouth/Throat: Oropharynx is clear and moist. No oropharyngeal exudate.  Cardiovascular: Normal rate, regular rhythm  and normal heart sounds. Exam reveals no gallop and no friction rub.  No murmur heard.  Pulmonary/Chest: Effort normal and breath sounds normal. No respiratory distress.  has no wheezes.  Neck = supple, no nuchal rigidity Abdominal: Soft. Bowel sounds are normal.  exhibits no distension. There is no tenderness.  Lymphadenopathy: no cervical adenopathy. No axillary adenopathy Neurological: alert and oriented to person, place, and time.  Skin: Skin is warm and dry. Pustular rash improving Buttock = sacral decub clean wound base but still has clear drainage. No surrounding erythema Psychiatric: a normal mood and affect.  behavior is normal.    Lab Results Recent Labs    12/11/21 0821  NA 134*  K 4.5  CL 100  CO2 24  BUN 41*  CREATININE 1.39*   Liver Panel No results for input(s): PROT, ALBUMIN, AST, ALT, ALKPHOS, BILITOT, BILIDIR, IBILI in the last 72 hours. Sedimentation Rate No results for input(s): ESRSEDRATE in the last 72 hours. C-Reactive Protein No results for input(s): CRP in the last 72 hours.  Microbiology: reviewed Studies/Results: No results found.   Assessment/Plan: Complicated bacteremia/disseminated MRSA infection = with bacteremia, SSTI, +/- septic arthritis. Will continue on vancomycin for total of 21 days -last day will be 2/14. then will switch to oral abtx for remaining period. Will have her follow up  in ID clinic  Sacral wound = recommend wound care rec - which is place alginate packing to help soak up drainage with mediplex QOD  Severe protein calorie malnutrition = continue with supplemental ensure drinks  Severe hip oa = ? Septic arthritis. Continue with vancomycin plus pain medication and PT as tolerated.  [facility] Pager: [phone]  12/12/2021, 2:53 PM

## 2021-12-12 NOTE — Consult Note (Signed)
[place] Nurse ostomy consult note Bedside RN to perform wound and ostomy care.  Stoma type/location: LLQ colostomy Stomal assessment/size: 1" well budded parastomal hernia present Peristomal assessment: hernia, skin intact Treatment options for stomal/peristomal skin: 1 piece flat with barrier ring Output soft brown stool Ostomy pouching: 1pc.flat LAWSON # 24 with barrier ring LAWSON # [id number] Education provided: none Enrolled patient in [place] program: Yes previously [place] Nurse Consult Note: Reason for Consult:chronic sacral wound Left hand wound has healed since last hospitalization Lesions to back present last month are newly epithelialized and no recurrence or new lesions present Wound type:pressure to sacrum Pressure Injury POA: Yes Measurement:4 cm x 1 cm x 0.5 cm  Wound bed: pale pink and moist Drainage (amount, consistency, odor) minimal serosanguinous  no odor Periwound:intact   Dressing procedure/placement/frequency: Cleanse sacral wound with NS and pat dry. Apply alginate to dead space (LAWSON # [id number]) COver with sacral foam to pad and protect  Change every other day.   Will not follow at this time.  Please re-consult if needed.  Domenic Moras MSN, RN, FNP-BC CWON Wound, Ostomy, Continence Nurse Pager [phone]

## 2021-12-12 NOTE — Progress Notes (Signed)
Mobility Specialist Progress Note:   12/12/21 0951  Mobility  Activity Ambulated with assistance in room  Level of Assistance Standby assist, set-up cues, supervision of patient - no hands on  Assistive Device Front wheel walker  Distance Ambulated (ft) 50 ft  Activity Response Tolerated fair  $Mobility charge 1 Mobility   Pt received in bed. Complaints of 10/10 hip pain but agreed to room ambulation. Will follow-up after pain medication for further ambulation. Left in bed with call bell in reach and all needs met.   [facility] Public librarian Phone [phone]

## 2021-12-12 NOTE — Progress Notes (Addendum)
Progress Note   Patient: Sherry Baker DOB: 1966/09/26 DOA: 11/25/2021     17 DOS: the patient was seen and examined on 12/12/2021   56 y.o. female with medical history significant for polysubstance abuse, including cocaine use as well as alcohol abuse, type I bipolar disorder, central hypertension, generalized anxiety disorder, stage IIIb chronic kidney disease with associated baseline creatinine 1.3-1.6, chronic anemia with baseline hemoglobin 10-12, who was admitted to [facility] on 11/18/2021 with acute metabolic encephalopathy after presenting from home to [facility] ED for evaluation of altered mental status.  Recently hospitalized from December 22 to January 4 for altered mental status.  At that time also she was on Precedex.  She went into respiratory failure had to be intubated.  Supposed to discharge to skilled nursing facility which the patient eventually declined and went home.  Presented back on 1/16 with altered mental status.  Again placed on Precedex infusion in the ICU.  Was found to have maculopapular rash which became pustular.  Etiology was unclear.  Left AMA from the hospital on 11/23/2021.  Patient return to the ER due to swelling of her hand.  There was also concern that the rash on her back could be from monkeypox.  Patient was hospitalized for further management.  Subsequently noted to have MRSA bacteremia.  Infectious disease was consulted.   Assessment and Plan: * Cellulitis of left hand- (present on admission) Resolved Imaging without evidence of bony infection but did demonstrate soft tissue infection Evaluated by orthopedic team with no additional recommendations Continue antibiotics per ID recommendation  MRSA bacteremia w/ associated pustular rash- (present on admission) -2/2 IVDU.  -ID following.   -Echocardiogram does not show any obvious vegetations.   -TEE negative -repeat BC NGTD  Right hip pain causing ambulatory dysfunction- (present on  admission) -x ray negative but MRI revealed degenerative changes of bilateral hips with a small to moderate-sized right hip joint effusion likely degenerative and reactive -- there was also intramuscular edema within the bilateral iliac [place] musculature within the adductor compartments of the bilateral hips right worse than left -Continue Lyrica,  Robaxin and as needed Oxy IR -Patient aware that narcotics will not be increased and expectation is to wean completely from narcotics shortly after discharge -Continue to allow for very low-dose IV Dilaudid for breakthrough pain -Continue scheduled Tylenol every 12 hours -GFR too low to allow for NSAIDs -Plan outpatient PT once discharged -Evaluated by orthopedic team and status post fluoroscopy guided needle aspiration of fluid collection demonstrated no growth x3 days   CKD (chronic kidney disease), stage III ([place])- (present on admission) Chronic kidney disease stage IIIb Hyponatremia Renal function at usual baseline Sodium stable between 134 and 135 2/8 BUN 41,  creatinine of 1.39 -no signs of GI bleeding to explain elevated BUN so could be more indicative of volume depletion -Continue to follow-note she remains 6 L positive   Hypertension- (present on admission) SBP in the 100s on 2/7 therefore Norvasc DC'd Continue carvedilol As of 2/8 BP remains stable off of Norvasc  Hyponatremia -stable low -continue to monitor  Anemia- (present on admission) Chronic for the most part.  Slight drop in hemoglobin is likely dilutional.   -No overt bleeding noted.  Continue to monitor. -transfuse for <7    Severe malnutrition ([place])- (present on admission) Encourage oral intake. Continue Ensure supplementation as well as mineral replacement Estimated body mass index is 18 kg/m as calculated from the following:   Height as of this  encounter: 5\' 2"  (1.575 m).   Weight as of this encounter: 44.6 kg.  Polysubstance abuse with associated depression  and anxiety- (present on admission) -known history of IV drug use and polysubstance abuse.   -Urine drug screen positive for benzodiazepine and cocaine -continue to encourage cessation  Continue BuSpar and Celexa  Colostomy in place [facility]) Seen by ostomy nurse.  Pustular rash - pustular rash.  Differential diagnosis does include monkeypox.  Seen by ID and they feel that this is most likely secondary to MRSA.  Continue contact precautions.  Lesions appear to be improving.   GERD (gastroesophageal reflux disease) Chronic.        Subjective:  Patient is laying in bed.  Continues to report constant hip pain.  Explained to patient that she will always have hip pain but that given her pain is primarily from degenerative hip disease that staying mobile and active is the best way to decrease pain.  States she is on the best pain regimen and no indications to increase medication doses at this time.  Patient verbalized understanding.  Encouraged to get out of bed and walk and sit up in recliner chair when possible.  Physical Exam: Vitals:   12/11/21 0747 12/11/21 1549 12/11/21 2302 12/12/21 0518  BP: 107/77 105/68 112/68 134/74  Pulse: 82 71 72 76  Resp: 16 16 17 18   Temp: 97.6 F (36.4 C) 98 F (36.7 C) 97.7 F (36.5 C) 97.9 F (36.6 C)  TempSrc: Oral Oral Oral Oral  SpO2: 98% 98% 99% 100%  Weight:      Height:       General: Calm, no acute distress Pulmonary: History lung sounds are clear to auscultation, stable on room air with normal work of breathing Cardiac: S1-S2, slightly suboptimal BP readings, no peripheral edema, regular pulse Abdomen: Scaphoid and soft nondistended ,nontender.  Bowel sounds present.  LBM 2/8-colostomy present and functional Extremities: Symmetrical in appearance without any obvious effusions or joint erythema Skin: Multiple maculopapular lesions throughout the back diffusely that are in the healing stages without drainage or redness Neurological: Radial  nerves II through XII grossly intact, strength throughout 4/5.  Sensation intact Psych: Alert and oriented x3  Data Reviewed: There are no new results to review at this time.   DVT Prophylaxis   ., Scds  Enoxaparin (lovenox) injection 30 mg    Family Communication:  Patient only  Disposition: Remains inpatient appropriate because:  Unsafe discharged plan; patient known IVDU and not an appropriate candidate for home IV antibiotics via PICC line.     A physical therapy consult is indicated based on the patients mobility assessment.   Mobility Assessment (last 72 hours)     Mobility Assessment     Row Name 12/11/21 2030 12/11/21 0726 12/10/21 2100   Does patient have an order for bedrest or is patient medically unstable No - Continue assessment No - Continue assessment No - Continue assessment   What is the highest level of mobility based on the progressive mobility assessment? Level 5 (Walks with assist in room/hall) - Balance while stepping forward/back and can walk in room with assist - Complete Level 5 (Walks with assist in room/hall) - Balance while stepping forward/back and can walk in room with assist - Complete Level 5 (Walks with assist in room/hall) - Balance while stepping forward/back and can walk in room with assist - Complete   Is the above level different from baseline mobility prior to current illness? Yes - Recommend PT order  Yes - Recommend PT order Yes - Recommend PT order    [place] Name 12/10/21 1230 12/10/21 0900 12/09/21 2200   Does patient have an order for bedrest or is patient medically unstable -- -- No - Continue assessment   What is the highest level of mobility based on the progressive mobility assessment? Level 5 (Walks with assist in room/hall) - Balance while stepping forward/back and can walk in room with assist - Complete Level 5 (Walks with assist in room/hall) - Balance while stepping forward/back and can walk in room with assist - Complete Level 5  (Walks with assist in room/hall) - Balance while stepping forward/back and can walk in room with assist - Complete   Is the above level different from baseline mobility prior to current illness? -- -- Yes - Recommend PT order              Planned Discharge Destination:  Home  COVID vaccination status:  Moderna 09/30/2021  Consultants: Psychiatry Infectious disease Orthopedic team Procedures: Echocardiogram TEE DG guided fluoroscopic right hip aspiration and injection Antibiotics: Ceftriaxone 1/22 Vancomycin 1/22 Vancomycin 1/24 >> note having alternate day administration due to low creatinine clearance     Time spent: 15 minutes  Author: Erin Hearing, NP 12/12/2021 11:41 AM  For on call review [email or web address].

## 2021-12-12 NOTE — Hospital Course (Addendum)
56 y.o. female with medical history significant for polysubstance abuse, including cocaine use as well as alcohol abuse, type I bipolar disorder, central hypertension, generalized anxiety disorder, stage IIIb chronic kidney disease with associated baseline creatinine 1.3-1.6, chronic anemia with baseline hemoglobin 10-12, who was admitted to [facility] on 11/18/2021 with acute metabolic encephalopathy after presenting from home to [facility] ED for evaluation of altered mental status.  Recently hospitalized from December 22 to January 4 for altered mental status.  At that time also she was on Precedex.  She went into respiratory failure had to be intubated.  Supposed to discharge to skilled nursing facility which the patient eventually declined and went home.  Presented back on 1/16 with altered mental status.  Again placed on Precedex infusion in the ICU.  Was found to have maculopapular rash which became pustular.  Etiology was unclear.  Left AMA from the hospital on 11/23/2021.  Patient return to the ER due to swelling of her hand.  There was also concern that the rash on her back could be from monkeypox.  Patient was hospitalized for further management.  Subsequently noted to have MRSA bacteremia.  Infectious disease was consulted.

## 2021-12-13 ENCOUNTER — Other Ambulatory Visit (HOSPITAL_COMMUNITY): Payer: Self-pay

## 2021-12-13 DIAGNOSIS — M25552 Pain in left hip: Secondary | ICD-10-CM | POA: Diagnosis not present

## 2021-12-13 DIAGNOSIS — S31000D Unspecified open wound of lower back and pelvis without penetration into retroperitoneum, subsequent encounter: Secondary | ICD-10-CM

## 2021-12-13 DIAGNOSIS — L03114 Cellulitis of left upper limb: Secondary | ICD-10-CM | POA: Diagnosis not present

## 2021-12-13 DIAGNOSIS — G8929 Other chronic pain: Secondary | ICD-10-CM | POA: Diagnosis not present

## 2021-12-13 DIAGNOSIS — L89159 Pressure ulcer of sacral region, unspecified stage: Secondary | ICD-10-CM | POA: Diagnosis not present

## 2021-12-13 MED ORDER — TRAMADOL HCL 50 MG PO TABS
50.0000 mg | ORAL_TABLET | Freq: Four times a day (QID) | ORAL | 0 refills | Status: DC | PRN
  Filled 2021-12-13: qty 30, 8d supply, fill #0

## 2021-12-13 MED ORDER — ACETAMINOPHEN 325 MG PO TABS
650.0000 mg | ORAL_TABLET | Freq: Two times a day (BID) | ORAL | 0 refills | Status: DC
  Filled 2021-12-13: qty 60, 15d supply, fill #0

## 2021-12-13 MED ORDER — BUSPIRONE HCL 15 MG PO TABS
15.0000 mg | ORAL_TABLET | Freq: Two times a day (BID) | ORAL | 1 refills | Status: DC
  Filled 2021-12-13: qty 60, 30d supply, fill #0

## 2021-12-13 MED ORDER — CERTAVITE/ANTIOXIDANTS PO TABS
1.0000 | ORAL_TABLET | Freq: Every day | ORAL | 6 refills | Status: DC
  Filled 2021-12-13: qty 30, 30d supply, fill #0

## 2021-12-13 MED ORDER — LIDOCAINE 5 % EX PTCH
2.0000 | MEDICATED_PATCH | CUTANEOUS | Status: DC
  Administered 2021-12-13 – 2021-12-15 (×3): 2 via TRANSDERMAL
  Filled 2021-12-13 (×3): qty 2

## 2021-12-13 MED ORDER — PANTOPRAZOLE SODIUM 40 MG PO TBEC
40.0000 mg | DELAYED_RELEASE_TABLET | Freq: Every day | ORAL | 6 refills | Status: DC
  Filled 2021-12-13: qty 30, 30d supply, fill #0

## 2021-12-13 MED ORDER — CITALOPRAM HYDROBROMIDE 10 MG PO TABS
10.0000 mg | ORAL_TABLET | Freq: Every day | ORAL | 1 refills | Status: DC
  Filled 2021-12-13: qty 30, 30d supply, fill #0

## 2021-12-13 MED ORDER — FOLIC ACID 1 MG PO TABS
1.0000 mg | ORAL_TABLET | Freq: Every day | ORAL | 1 refills | Status: DC
  Filled 2021-12-13: qty 30, 30d supply, fill #0

## 2021-12-13 MED ORDER — VITAMIN D3 25 MCG PO TABS
1000.0000 [IU] | ORAL_TABLET | Freq: Every day | ORAL | 3 refills | Status: DC
  Filled 2021-12-13: qty 30, 30d supply, fill #0

## 2021-12-13 MED ORDER — THIAMINE HCL 100 MG PO TABS
100.0000 mg | ORAL_TABLET | Freq: Every day | ORAL | 1 refills | Status: DC
  Filled 2021-12-13: qty 30, 30d supply, fill #0

## 2021-12-13 MED ORDER — DICLOFENAC SODIUM 1 % EX GEL
2.0000 g | Freq: Four times a day (QID) | CUTANEOUS | 0 refills | Status: DC
  Filled 2021-12-13: qty 100, 7d supply, fill #0

## 2021-12-13 MED ORDER — OXYCODONE HCL 5 MG PO TABS
5.0000 mg | ORAL_TABLET | ORAL | 0 refills | Status: DC
  Filled 2021-12-13: qty 30, 5d supply, fill #0

## 2021-12-13 MED ORDER — ENSURE ENLIVE PO LIQD
237.0000 mL | Freq: Three times a day (TID) | ORAL | 12 refills | Status: DC
  Filled 2021-12-13: qty 237, 1d supply, fill #0

## 2021-12-13 MED ORDER — CARVEDILOL 3.125 MG PO TABS
3.1250 mg | ORAL_TABLET | Freq: Two times a day (BID) | ORAL | 1 refills | Status: DC
  Filled 2021-12-13: qty 60, 30d supply, fill #0

## 2021-12-13 MED ORDER — DOXYCYCLINE HYCLATE 100 MG PO TABS
100.0000 mg | ORAL_TABLET | Freq: Two times a day (BID) | ORAL | 0 refills | Status: AC
  Filled 2021-12-13: qty 42, 21d supply, fill #0

## 2021-12-13 MED ORDER — PREGABALIN 25 MG PO CAPS
25.0000 mg | ORAL_CAPSULE | Freq: Three times a day (TID) | ORAL | 0 refills | Status: DC
  Filled 2021-12-13: qty 90, 30d supply, fill #0

## 2021-12-13 MED ORDER — METHOCARBAMOL 500 MG PO TABS
500.0000 mg | ORAL_TABLET | Freq: Four times a day (QID) | ORAL | 0 refills | Status: DC
  Filled 2021-12-13: qty 90, 23d supply, fill #0

## 2021-12-13 MED ORDER — HYDROXYZINE HCL 10 MG PO TABS
10.0000 mg | ORAL_TABLET | Freq: Three times a day (TID) | ORAL | 0 refills | Status: DC | PRN
  Filled 2021-12-13: qty 30, 10d supply, fill #0

## 2021-12-13 MED ORDER — LIDOCAINE 5 % EX PTCH
2.0000 | MEDICATED_PATCH | CUTANEOUS | 0 refills | Status: DC
  Filled 2021-12-13: qty 30, 15d supply, fill #0

## 2021-12-13 NOTE — Discharge Instructions (Signed)
You have an appointment scheduled at [place] on 01/08/2022 at 10:30am.

## 2021-12-13 NOTE — Progress Notes (Signed)
CSW scheduled patient an appointment at [facility] for 01/08/2022 at 10:30am.  Madilyn Fireman, MSW, LCSW Transitions of Care   Clinical Social Worker II [phone]

## 2021-12-13 NOTE — Progress Notes (Signed)
Mobility Specialist Progress Note:   12/13/21 1035  Mobility  Activity Ambulated with assistance in hallway  Level of [place] wheel walker  Distance Ambulated (ft) 150 ft  Activity Response Tolerated well  $Mobility charge 1 Mobility   Pt received in bed willing to participate in mobility. Complaint of 7/10 R hip pain. Left EOB with call bell in reach and all needs met.   [facility] Public librarian Phone [phone]

## 2021-12-13 NOTE — Progress Notes (Signed)
[facility] for Infectious Disease    Date of Admission:  11/25/2021    ID: EDIT RICCIARDELLI is a 56 y.o. female with complicated MRSA bacteremia with possible septic arthritis of right hip Principal Problem:   Cellulitis of left hand Active Problems:   Severe malnutrition (HCC)   Anemia   Hypertension   Polysubstance abuse with associated depression and anxiety   Hyponatremia   CKD (chronic kidney disease), stage III (HCC)   Colostomy in place [facility])   MRSA bacteremia w/ associated pustular rash   Right hip pain causing ambulatory dysfunction   Chronic Sacral wound, subsequent encounter    Subjective: Afebrile. Still having some hip pain but improvement on the skin infection.  Medications:   acetaminophen  650 mg Oral Q12H   busPIRone  15 mg Oral BID   carvedilol  3.125 mg Oral BID WC   Chlorhexidine Gluconate Cloth  6 each Topical Q0600   citalopram  10 mg Oral Daily   diclofenac Sodium  2 g Topical QID   enoxaparin (LOVENOX) injection  30 mg Subcutaneous Q24H   feeding supplement  237 mL Oral TID BM   folic acid  1 mg Oral Daily   lidocaine  2 patch Transdermal Q24H   methocarbamol  500 mg Oral QID   multivitamin with minerals  1 tablet Oral Daily   mupirocin ointment   Nasal BID   oxyCODONE  5 mg Oral Q4H   pantoprazole  40 mg Oral Daily   pregabalin  25 mg Oral TID   senna-docusate  2 tablet Oral QHS   thiamine  100 mg Oral Daily    Objective: Vital signs in last 24 hours: Temp:  [97.7 F (36.5 C)-98.4 F (36.9 C)] 97.7 F (36.5 C) (02/10 0853) Pulse Rate:  [73-92] 75 (02/10 0853) Resp:  [15-17] 16 (02/10 0853) BP: (106-122)/(66-81) 119/81 (02/10 0853) SpO2:  [97 %-100 %] 100 % (02/10 0853) Weight:  [45.6 kg-46.8 kg] 46.8 kg (02/10 0500)  Physical Exam  Constitutional:  oriented to person, place, and time. appears well-developed and well-nourished. No distress.  HENT: [place]/AT, PERRLA, no scleral icterus Mouth/Throat: Oropharynx is clear and moist. No  oropharyngeal exudate.  Cardiovascular: Normal rate, regular rhythm and normal heart sounds. Exam reveals no gallop and no friction rub.  No murmur heard.  Pulmonary/Chest: Effort normal and breath sounds normal. No respiratory distress.  has no wheezes.  Neck = supple, no nuchal rigidity Abdominal: Soft. Bowel sounds are normal.  exhibits no distension. There is no tenderness. Ostomy Back = skin lesions improving. Sacral pressure ulcer has clean base Lymphadenopathy: no cervical adenopathy. No axillary adenopathy Neurological: alert and oriented to person, place, and time.  Skin: Skin is warm and dry. No rash noted. No erythema.  Psychiatric: a normal mood and affect.  behavior is normal.    Lab Results Recent Labs    12/11/21 0821  NA 134*  K 4.5  CL 100  CO2 24  BUN 41*  CREATININE 1.39*    Microbiology: Blood cx 1/25 ngtd Arthrocentesis 1/30 ngtd Studies/Results: No results found.   Assessment/Plan: Complicated MRSA bacteremia, with possible septic arthritis = next dose of vancomycin is on 2/12. Then will convert her to oral doxy 100mg  bid x 3 addn wk to complete 6 wk for septic arthritis.  Please have her take on full stomach. And give addn anti-emetics if needed  Chronic hip pain = currently on oxycodone  Sacral decub ulcer = to treat with  alginate and mepilex. Pleae give some instruction and supplies for home  Will have her follow up in the ID clinic in 3-4 wk  Please have her be seen in community wellness clinic  Will sign off  [facility] for Infectious Diseases Pager: [phone]  12/13/2021, 2:00 PM

## 2021-12-13 NOTE — TOC Progression Note (Signed)
Transition of Care [facility]) - Progression Note    Patient Details  Name: Sherry Baker MRN: [id number] Date of Birth: 1966-03-22  Transition of Care [facility]) CM/SW [place], RN Phone Number: 12/13/2021, 2:41 PM  Clinical Narrative:    CM met with the patient at the bedside and the patient will be discharge to home on Sunday, 12/15/2021.  The patient lives with friends in the home who will assist with the patient's care.  The patient has RW in the home from a previous visit at [facility].  PCP visit was established and documented in the discharge instructions.  Referral placed for Outpatient rehabilitation at [facility] and reminder placed for patient to call for follow up appointment.  The patient's medications will be provided through [place] and patient has Medicaid to coverage charges.  The patient has chronic colostomy and has available supplies at home - please send home any available supplies in the hospital room home with the patient.  CM and MSW with [facility] will continue to follow the patient for discharge to home.   Expected Discharge Plan: Home/Self Care Barriers to Discharge: Continued Medical Work up  Expected Discharge Plan and Services Expected Discharge Plan: Home/Self Care In-house Referral: Clinical Social Work Discharge Planning Services: CM Consult, [facility] (Patient to schedule [facility] appointment since note in chart stating pt with history of "no show" for clinic appointments.)   Living arrangements for the past 2 months: Apartment                                       Social Determinants of Health (SDOH) Interventions    Readmission Risk Interventions Readmission Risk Prevention Plan 12/10/2021 11/15/2021 09/30/2021  Transportation Screening Complete Complete Complete  Medication Review Press photographer) Complete Complete Complete  PCP or Specialist appointment within 3-5 days of discharge Complete Complete Complete   HRI or Home Care Consult Not Complete - Not Complete  HRI or Home Care Consult Pt Refusal Comments history of unsafe home environment - -  SW Recovery Care/Counseling Consult Complete - Complete  Palliative Care Screening Not Applicable Not Applicable Not [place] Not Applicable Not Applicable Not Applicable  Some recent data might be hidden

## 2021-12-13 NOTE — Progress Notes (Signed)
Progress Note   Patient: Sherry Baker HER:[id number] DOB: Aug 05, 1966 DOA: 11/25/2021     19 DOS: the patient was seen and examined on 12/14/2021   56 y.o. female with medical history significant for polysubstance abuse, including cocaine use as well as alcohol abuse, type I bipolar disorder, central hypertension, generalized anxiety disorder, stage IIIb chronic kidney disease with associated baseline creatinine 1.3-1.6, chronic anemia with baseline hemoglobin 10-12, who was admitted to [facility] [place] on 11/18/2021 with acute metabolic encephalopathy after presenting from home to [facility] ED for evaluation of altered mental status.  Recently hospitalized from December 22 to January 4 for altered mental status.  At that time also she was on Precedex.  She went into respiratory failure had to be intubated.  Supposed to discharge to skilled nursing facility which the patient eventually declined and went home.  Presented back on 1/16 with altered mental status.  Again placed on Precedex infusion in the ICU.  Was found to have maculopapular rash which became pustular.  Etiology was unclear.  Left AMA from the hospital on 11/23/2021.  Patient return to the ER due to swelling of her hand.  There was also concern that the rash on her back could be from monkeypox.  Patient was hospitalized for further management.  Subsequently noted to have MRSA bacteremia.  Infectious disease was consulted.   Assessment and Plan: * Cellulitis of left hand- (present on admission) Resolved Imaging without evidence of bony infection but did demonstrate soft tissue infection Evaluated by orthopedic team with no additional recommendations Continue antibiotics per ID recommendation-patient will start oral doxycycline after discharge on 2/14 for total of 3 weeks duration She will need to follow-up with the ID clinic after discharge.  An ambulatory referral will be sent.  Chronic Sacral wound, subsequent encounter Pressure Injury POA:  Yes Measurement:4 cm x 1 cm x 0.5 cm  Dressing procedure/placement/frequency: Cleanse sacral wound with NS and pat dry. Apply alginate to dead space (LAWSON # [id number]) COver with sacral foam to pad and protect  Change every other day.  Recommend continue wound care after discharge.   Right hip pain causing ambulatory dysfunction- (present on admission) -x ray negative but MRI revealed degenerative changes of bilateral hips with a small to moderate-sized right hip joint effusion likely degenerative and reactive -- there was also intramuscular edema within the bilateral iliac [place] musculature within the adductor compartments of the bilateral hips right worse than left -Evaluated by orthopedic team and status post fluoroscopy guided needle aspiration of fluid collection demonstrated no growth x3 days -Continue Lyrica,  Robaxin and as needed Oxy IR -Patient aware that narcotics will not be increased and expectation is to wean completely from narcotics shortly after discharge -Continue to Ultram prn -Continue scheduled Tylenol every 12 hours -GFR too low to allow for NSAIDs -Plan outpatient PT once discharged -2/10 add lidocaine patch to bilateral hips Continue all of the above medications at discharge   MRSA bacteremia w/ associated pustular rash- (present on admission) -2/2 IVDU.  -ID following.   -Echocardiogram does not show any obvious vegetations.   -TEE negative -repeat BC NGTD -Complete antibiotics on 2/12 and discharged home.  She will begin oral doxycycline on 2/14 for a total of 3 weeks duration  Colostomy in place [facility]) Seen by ostomy nurse.  CKD (chronic kidney disease), stage III ([place])- (present on admission) Chronic kidney disease stage IIIb Hyponatremia Renal function at usual baseline Sodium stable between 134 and 135 2/8 BUN 41,  creatinine  of 1.39    Hyponatremia -stable low -continue to monitor  Polysubstance abuse with associated depression and anxiety- (present  on admission) -known history of IV drug use and polysubstance abuse.   -Urine drug screen positive for benzodiazepine and cocaine -continue to encourage cessation  Continue BuSpar and Celexa  GERD (gastroesophageal reflux disease) Chronic. Continue PPI after discharge  Hypertension- (present on admission) SBP in the 100s on 2/7 therefore Norvasc DC'd Continue carvedilol As of 2/8 BP remains stable off of Norvasc  Anemia- (present on admission) Chronic for the most part.   Hemoglobin 8.3 on 2/6  Severe malnutrition ([place])- (present on admission) Encourage oral intake. Continue Ensure supplementation as well as mineral replacement Estimated body mass index is 18 kg/m as calculated from the following:   Height as of this encounter: 5\' 2"  (1.575 m).   Weight as of this encounter: 44.6 kg.        Subjective:  Ambulating in [place] with therapist.  Continues to have hip pain with activity especially on the right  Physical Exam: Vitals:   12/13/21 1649 12/13/21 2159 12/14/21 0616 12/14/21 0818  BP: 132/70 114/67 119/67 123/70  Pulse: 93 84 72 75  Resp:  16 18 18   Temp: 98 F (36.7 C) 98.3 F (36.8 C) (!) 97.5 F (36.4 C) 98.6 F (37 C)  TempSrc: Oral Oral Oral Axillary  SpO2: 100% 97% 99% 98%  Weight:      Height:       General: Calm, no acute distress Pulmonary: History lung sounds are clear to auscultation, stable on room air with normal work of breathing Cardiac: S1-S2, slightly suboptimal BP readings, no peripheral edema, regular pulse Abdomen: flat and soft nondistended ,nontender.  Bowel sounds present.  LBM 2/9-colostomy present and functional Extremities: Symmetrical in appearance without any obvious effusions or joint erythema Skin: Multiple maculopapular lesions throughout the back diffusely that are in the healing stages without drainage or redness Neurological: Radial nerves II through XII grossly intact, strength throughout 4/5.  Sensation intact Psych:  Alert and oriented x3  Data Reviewed: There are no new results to review at this time.   DVT Prophylaxis   ., Scds  Enoxaparin (lovenox) injection 30 mg    Family Communication:  Patient only  Disposition: Remains inpatient appropriate because:  Unsafe discharged plan; patient known IVDU and not an appropriate candidate for home IV antibiotics via PICC line.     A physical therapy consult is indicated based on the patients mobility assessment.   Mobility Assessment (last 72 hours)     Mobility Assessment     Row Name 12/14/21 0800 12/13/21 1500 12/11/21 2030   Does patient have an order for bedrest or is patient medically unstable No - Continue assessment -- No - Continue assessment   What is the highest level of mobility based on the progressive mobility assessment? Level 5 (Walks with assist in room/Iyannah Blake) - Balance while stepping forward/back and can walk in room with assist - Complete Level 6 (Walks independently in room and Roselyn Doby) - Balance while walking in room without assist - Complete Level 5 (Walks with assist in room/Brian Zeitlin) - Balance while stepping forward/back and can walk in room with assist - Complete   Is the above level different from baseline mobility prior to current illness? Yes - Recommend PT order -- Yes - Recommend PT order              Planned Discharge Destination:  Home  COVID vaccination status:  Moderna 09/30/2021  Consultants: Psychiatry Infectious disease Orthopedic team Procedures: Echocardiogram TEE DG guided fluoroscopic right hip aspiration and injection Antibiotics: Ceftriaxone 1/22 Vancomycin 1/22 Vancomycin 1/24 >> note having alternate day administration due to low creatinine clearance     Time spent: 15 minutes  Author: Kayleen Memos, DO 12/14/2021 11:29 AM  For on call review [email or web address].

## 2021-12-13 NOTE — Assessment & Plan Note (Addendum)
Pressure Injury POA: Yes Measurement:4 cm x 1 cm x 0.5 cm  Dressing procedure/placement/frequency: Cleanse sacral wound with NS and pat dry. Apply alginate to dead space (LAWSON # [id number]) COver with sacral foam to pad and protect  Change every other day.  Recommend continue wound care after discharge.

## 2021-12-13 NOTE — Discharge Summary (Addendum)
Physician Discharge Summary   Patient: Sherry Baker MRN: [id number] DOB: 1966/06/09  Admit date:     11/25/2021  Discharge date: 12/15/2021  Discharge Physician: Kayleen Memos   PCP: Pcp, No   Recommendations at discharge:   Patient will need to call the outpatient rehabilitation center on [facility] to schedule outpatient therapy session Patient will need to follow-up with [facility] on 01/08/2022 at 10:30 AM for routine posthospital follow-up with your primary care physician Patient will need to follow-up with the infectious disease clinic after discharge.  An ambulatory referral will be sent and the clinic will call you with appointment date and time  Discharge Diagnoses: Principal Problem:   Cellulitis of left hand Active Problems:   Severe malnutrition ([place])   Anemia   Hypertension   Polysubstance abuse with associated depression and anxiety   Hyponatremia   CKD (chronic kidney disease), stage III (HCC)   Colostomy in place [facility])   MRSA bacteremia w/ associated pustular rash   Right hip pain causing ambulatory dysfunction   Chronic Sacral wound, subsequent encounter  Resolved Problems:   * No resolved hospital problems. *   Hospital Course: 56 y.o. female with medical history significant for polysubstance abuse, including cocaine use as well as alcohol abuse, type I bipolar disorder, central hypertension, generalized anxiety disorder, stage IIIb chronic kidney disease with associated baseline creatinine 1.3-1.6, chronic anemia with baseline hemoglobin 10-12, who was admitted to [facility] on 11/18/2021 with acute metabolic encephalopathy after presenting from home to [facility] ED for evaluation of altered mental status.  Recently hospitalized from December 22 to January 4 for altered mental status.  At that time also she was on Precedex.  She went into respiratory failure had to be intubated.  Supposed to discharge to skilled nursing facility which  the patient eventually declined and went home.  Presented back on 1/16 with altered mental status.  Again placed on Precedex infusion in the ICU.  Was found to have maculopapular rash which became pustular.  Etiology was unclear.  Left AMA from the hospital on 11/23/2021.  Patient return to the ER due to swelling of her hand.  There was also concern that the rash on her back could be from monkeypox.  Patient was hospitalized for further management.  Subsequently noted to have MRSA bacteremia.  Infectious disease was consulted.  Assessment and Plan: * Cellulitis of left hand- (present on admission) Resolved Imaging without evidence of bony infection but did demonstrate soft tissue infection Evaluated by orthopedic team with no additional recommendations Continue antibiotics per ID recommendation-patient will start oral doxycycline after discharge on 2/14 for total of 3 weeks duration She will need to follow-up with the ID clinic after discharge.  An ambulatory referral will be sent.  Chronic Sacral wound, subsequent encounter Pressure Injury POA: Yes Measurement:4 cm x 1 cm x 0.5 cm  Dressing procedure/placement/frequency: Cleanse sacral wound with NS and pat dry. Apply alginate to dead space (LAWSON # [id number]) COver with sacral foam to pad and protect  Change every other day.  Recommend continue wound care after discharge.   Right hip pain causing ambulatory dysfunction- (present on admission) -x ray negative but MRI revealed degenerative changes of bilateral hips with a small to moderate-sized right hip joint effusion likely degenerative and reactive -- there was also intramuscular edema within the bilateral iliac [place] musculature within the adductor compartments of the bilateral hips right worse than left -Evaluated by orthopedic team and status  post fluoroscopy guided needle aspiration of fluid collection demonstrated no growth x3 days -Continue Lyrica,  Robaxin and as needed Oxy IR -Patient aware  that narcotics will not be increased and expectation is to wean completely from narcotics shortly after discharge -Continue to Ultram prn -Continue scheduled Tylenol every 12 hours -GFR too low to allow for NSAIDs -Plan outpatient PT once discharged -2/10 add lidocaine patch to bilateral hips Continue all of the above medications at discharge   MRSA bacteremia w/ associated pustular rash- (present on admission) -2/2 IVDU.  -ID following.   -Echocardiogram does not show any obvious vegetations.   -TEE negative -repeat BC NGTD -Complete antibiotics on 2/12 and discharged home.  She will begin oral doxycycline on 2/14 for a total of 3 weeks duration  Colostomy in place [facility]) Seen by ostomy nurse.  CKD (chronic kidney disease), stage III (HCC)- (present on admission) Chronic kidney disease stage IIIb Hyponatremia Renal function at usual baseline Sodium stable between 134 and 135 2/8 BUN 41,  creatinine of 1.39    Hyponatremia -stable low -continue to monitor  Polysubstance abuse with associated depression and anxiety- (present on admission) -known history of IV drug use and polysubstance abuse.   -Urine drug screen positive for benzodiazepine and cocaine -continue to encourage cessation  Continue BuSpar and Celexa  GERD (gastroesophageal reflux disease) Chronic. Continue PPI after discharge  Hypertension- (present on admission) SBP in the 100s on 2/7 therefore Norvasc DC'd Continue carvedilol As of 2/8 BP remains stable off of Norvasc  Anemia- (present on admission) Chronic for the most part.   Hemoglobin 8.3 on 2/6  Severe malnutrition ([place])- (present on admission) Encourage oral intake. Continue Ensure supplementation as well as mineral replacement Estimated body mass index is 18 kg/m as calculated from the following:   Height as of this encounter: 5\' 2"  (1.575 m).   Weight as of this encounter: 44.6 kg.   Sepsis was ruled in.  Present on admission.         Consultants: Psychiatry Infectious disease Orthopedic team Procedures performed: Echocardiogram TEE DG guided fluoroscopic right hip aspiration and injection  Disposition: Home Diet recommendation:  Regular diet  DISCHARGE MEDICATION: Allergies as of 12/15/2021       Reactions   Penicillins Hives, Swelling   Has patient had a PCN reaction causing immediate rash, facial/tongue/throat swelling, SOB or lightheadedness with hypotension: Yes Has patient had a PCN reaction causing severe rash involving mucus membranes or skin necrosis: No Has patient had a PCN reaction that required hospitalization No Has patient had a PCN reaction occurring within the last 10 years: Yes If all of the above answers are "NO", then may proceed with Cephalosporin use.        Medication List     STOP taking these medications    amLODipine 10 MG tablet Commonly known as: NORVASC   gabapentin 100 MG capsule Commonly known as: NEURONTIN   lactulose 10 GM/15ML solution Commonly known as: CHRONULAC   nicotine 21 mg/24hr patch Commonly known as: NICODERM CQ - dosed in mg/24 hours       TAKE these medications    acetaminophen 325 MG tablet Commonly known as: TYLENOL Take 2 tablets (650 mg total) by mouth every 12 (twelve) hours. What changed:  when to take this reasons to take this   busPIRone 15 MG tablet Commonly known as: BUSPAR Take 1 tablet (15 mg total) by mouth 2 (two) times daily.   carvedilol 3.125 MG tablet Commonly known as: COREG  Take 1 tablet (3.125 mg total) by mouth 2 (two) times daily with a meal.   CertaVite/Antioxidants Tabs Take 1 tablet by mouth daily.   citalopram 10 MG tablet Commonly known as: CELEXA Take 1 tablet (10 mg total) by mouth daily.   diclofenac Sodium 1 % Gel Commonly known as: VOLTAREN Apply 2 g topically 4 (four) times daily. What changed: how much to take   doxycycline 100 MG tablet Commonly known as: VIBRA-TABS Take 1 tablet  (100 mg total) by mouth 2 (two) times daily for 21 days. Start taking on: December 17, 2021   feeding supplement Liqd Take 237 mLs by mouth 3 (three) times daily between meals.   folic acid 1 MG tablet Commonly known as: FOLVITE Take 1 tablet (1 mg total) by mouth daily.   hydrOXYzine 10 MG tablet Commonly known as: ATARAX Take 1 tablet (10 mg total) by mouth 3 (three) times daily as needed for anxiety. What changed:  reasons to take this additional instructions   lidocaine 5 % Commonly known as: LIDODERM Place 2 patches onto the skin daily. Remove & Discard patch within 12 hours or as directed by MD   methocarbamol 500 MG tablet Commonly known as: ROBAXIN Take 1 tablet (500 mg total) by mouth 4 (four) times daily.   oxyCODONE 5 MG immediate release tablet Commonly known as: Oxy IR/ROXICODONE Take 1 tablet (5 mg total) by mouth every 4 (four) hours.   pantoprazole 40 MG tablet Commonly known as: Protonix Take 1 tablet (40 mg total) by mouth daily. What changed: when to take this   pregabalin 25 MG capsule Commonly known as: LYRICA Take 1 capsule (25 mg total) by mouth 3 (three) times daily.   thiamine 100 MG tablet Take 1 tablet (100 mg total) by mouth daily.   traMADol 50 MG tablet Commonly known as: ULTRAM Take 1 tablet (50 mg total) by mouth every 6 (six) hours as needed for moderate pain.   Vitamin D3 25 MCG tablet Commonly known as: Vitamin D Take 1 tablet (1,000 Units total) by mouth daily.        Follow-up Information     [facility] Schedule an appointment as soon as possible for a visit.   Specialty: Rehabilitation Why: Please call and schedule follow up appointments for outpatient therapies. Contact information: [address] [id number] [place] [phone]        [facility]. Go on 01/08/2022.   Why: CSW scheduled patient an appointment at  [facility] for 01/08/2022 at 10:30am. Contact information: [address] [place] [address] [phone]                Discharge Exam: Filed Weights   12/12/21 2153 12/13/21 0500 12/15/21 0500  Weight: 45.6 kg 46.8 kg 45.4 kg   General: Calm, no acute distress Pulmonary: History lung sounds are clear to auscultation, stable on room air with normal work of breathing Cardiac: S1-S2, slightly suboptimal BP readings, no peripheral edema, regular pulse Abdomen: flat and soft nondistended ,nontender.  Bowel sounds present.  LBM 2/9-colostomy present and functional Extremities: Symmetrical in appearance without any obvious effusions or joint erythema Skin: Multiple maculopapular lesions throughout the back diffusely that are in the healing stages without drainage or redness Neurological: Radial nerves II through XII grossly intact, strength throughout 4/5.  Sensation intact Psych: Alert and oriented x3    Condition at discharge: stable  The results of significant diagnostics  from this hospitalization (including imaging, microbiology, ancillary and laboratory) are listed below for reference.   Imaging Studies: DG Chest 1 View  Result Date: 11/18/2021 CLINICAL DATA:  Altered mental status. EXAM: CHEST  1 VIEW COMPARISON:  November 03, 2021 FINDINGS: The heart size and mediastinal contours are within normal limits. Mild increased pulmonary interstitium is identified bilaterally. The visualized skeletal structures are unremarkable. IMPRESSION: Mild increased pulmonary interstitium is identified bilaterally. Mild pulmonary edema versus viral infection. Electronically Signed   By: Abelardo Diesel M.D.   On: 11/18/2021 19:03   DG Chest 2 View  Result Date: 11/25/2021 CLINICAL DATA:  Chest pain. EXAM: CHEST - 2 VIEW COMPARISON:  Radiograph dated 11/20/2021. FINDINGS: Minimal bibasilar atelectasis. No focal consolidation, pleural effusion, pneumothorax. The cardiac silhouette is  within normal limits. No acute osseous pathology. IMPRESSION: No active cardiopulmonary disease. Electronically Signed   By: Anner Crete M.D.   On: 11/25/2021 03:00   CT HEAD WO CONTRAST (5MM)  Result Date: 11/18/2021 CLINICAL DATA:  Mental status changes.  Smoking crack at home. EXAM: CT HEAD WITHOUT CONTRAST TECHNIQUE: Contiguous axial images were obtained from the base of the skull through the vertex without intravenous contrast. RADIATION DOSE REDUCTION: This exam was performed according to the departmental dose-optimization program which includes automated exposure control, adjustment of the mA and/or kV according to patient size and/or use of iterative reconstruction technique. COMPARISON:  10/25/2021 FINDINGS: Brain: There is central and cortical atrophy. There is no intra or extra-axial fluid collection or mass lesion. The basilar cisterns and ventricles have a normal appearance. There is no CT evidence for acute infarction or hemorrhage. Vascular: There is dense atherosclerotic calcification of the internal carotid arteries. No hyperdense vessels. Skull: Normal. Negative for fracture or focal lesion. Sinuses/Orbits: LEFT mastoid effusion, stable. Minimal opacification of the paranasal sinuses. Other: None. IMPRESSION: 1.  No evidence for acute intracranial abnormality. 2. Central and cortical atrophy. 3. LEFT mastoid effusion. Electronically Signed   By: Nolon Nations M.D.   On: 11/18/2021 18:55   MR HIP RIGHT WO CONTRAST  Result Date: 11/30/2021 CLINICAL DATA:  Hip pain, infection suspected, xray done MRSA bacteremia EXAM: MR OF THE RIGHT HIP WITHOUT CONTRAST TECHNIQUE: Multiplanar, multisequence MR imaging was performed. No intravenous contrast was administered. COMPARISON:  X-ray 11/26/2021.  CT 12/18/2018 and 01/17/2017 FINDINGS: Bones/Joint/Cartilage Severe, end-stage degenerative changes of the bilateral hip joints with complete joint space loss, bulky marginal osteophytes, and  extensive subchondral cyst formation along both sides of the joint. There is osseous remodeling of both the femoral heads and bilateral acetabula. Patchy bone marrow edema throughout the bilateral femoral head and neck regions as well as within the bilateral acetabula, likely reactive and degenerative. No evidence of acute fracture. No dislocation. Small-moderate-sized right and trace left joint effusions, likely reactive. Bony pelvis intact without fracture or diastasis. Mild discogenic endplate marrow changes at L4-5 and L5-S1. No findings to suggest discitis within the visualized lumbar spine. Ligaments Intact. Muscles and Tendons Intramuscular edema within the bilateral iliacus musculature and within the adductor compartments of the bilateral hips, right worse than left. No acute tendon tear. Soft tissues No extra-articular fluid collections. Trace free fluid within the pelvis, nonspecific. IMPRESSION: 1. Severe, end-stage degenerative changes of the bilateral hip joints. 2. Small-moderate-sized right hip joint effusion, likely degenerative/reactive. Although felt to be less likely, septic arthritis is not entirely excluded. 3. Intramuscular edema within the bilateral iliacus musculature and within the adductor compartments of the bilateral hips, right worse than left.  Electronically Signed   By: Davina Poke D.O.   On: 11/30/2021 15:56   DG CHEST PORT 1 VIEW  Result Date: 11/20/2021 CLINICAL DATA:  Evaluate for aspiration. EXAM: PORTABLE CHEST 1 VIEW COMPARISON:  11/18/21 FINDINGS: Stable cardiomediastinal contours. No pleural effusion or edema. No airspace opacities identified to suggest aspiration or pneumonia. IMPRESSION: No active disease. Electronically Signed   By: Kerby Moors M.D.   On: 11/20/2021 05:41   DG Hand Complete Left  Result Date: 11/25/2021 CLINICAL DATA:  Swelling and inflammation in the anterior aspect of the second and third metatarsals. EXAM: LEFT HAND - COMPLETE 3+ VIEW  COMPARISON:  None. FINDINGS: There is no evidence of fracture or dislocation. Degenerative changes are noted at the wrist. Soft tissue swelling is present over the dorsum of the hand and digits. Vascular calcifications are present in the soft tissues. IMPRESSION: Diffuse soft tissue swelling over the dorsum of the hand and digits. No acute osseous abnormality. Electronically Signed   By: Brett Fairy M.D.   On: 11/25/2021 03:03   DG FLUORO GUIDED NEEDLE PLC ASPIRATION/INJECTION LOC  Result Date: 12/02/2021 CLINICAL DATA:  Hip pain, hip aspiration EXAM: RIGHT HIP INJECTION UNDER FLUOROSCOPY COMPARISON:  MRI 11/30/2021 FLUOROSCOPY TIME:  Fluoroscopy Time:  12 seconds Radiation Exposure Index (if provided by the fluoroscopic device): 0.3 mGy Number of Acquired Spot Images: 1 PROCEDURE: Scout fluoroscopic image demonstrates severe right hip osteoarthritis with articular surface destruction in productive bony change. There is superolateral positioning of the femoral head with respect to the acetabulum. Overlying skin prepped with Betadine, draped in the usual sterile fashion, and infiltrated locally with buffered Lidocaine. An 18 gauge spinal needle advanced to the inferior medial margin of the right femoral neck and aspiration performed. Approximately 1 mL of bloody fluid was aspirated from the right hip joint. No immediate complication. IMPRESSION: Successful fluoroscopic guided right hip aspiration. Approximately 1 mL of bloody fluid was aspirated from the right hip joint and sent to the laboratory for analysis. Electronically Signed   By: Maurine Simmering M.D.   On: 12/02/2021 09:49   ECHOCARDIOGRAM COMPLETE  Result Date: 11/25/2021    ECHOCARDIOGRAM REPORT   Patient Name:   DASHAWNA DELBRIDGE Date of Exam: 11/25/2021 Medical Rec #:  [id number]      Height:       62.0 in Accession #:    [id number]     Weight:       101.4 lb Date of Birth:  09/18/1966       BSA:          1.432 m Patient Age:    6 years       BP:            142/78 mmHg Patient Gender: F              HR:           123 bpm. Exam Location:  Inpatient Procedure: 2D Echo, Cardiac Doppler and Color Doppler Indications:    Bacteremia  History:        Patient has prior history of Echocardiogram examinations, most                 recent 09/06/2021. Risk Factors:Hypertension.  Sonographer:    Glo Herring Referring Phys: [place]  1. Abnormal septal motion. Left ventricular ejection fraction, by estimation, is 55%. The left ventricle has normal function. The left ventricle has no regional wall motion abnormalities. Left ventricular diastolic parameters  are consistent with Grade I  diastolic dysfunction (impaired relaxation).  2. Right ventricular systolic function is normal. The right ventricular size is normal.  3. The mitral valve is abnormal. Trivial mitral valve regurgitation. No evidence of mitral stenosis.  4. The aortic valve is normal in structure. Aortic valve regurgitation is not visualized. No aortic stenosis is present.  5. The inferior vena cava is normal in size with greater than 50% respiratory variability, suggesting right atrial pressure of 3 mmHg. FINDINGS  Left Ventricle: Abnormal septal motion. Left ventricular ejection fraction, by estimation, is 55%. The left ventricle has normal function. The left ventricle has no regional wall motion abnormalities. The left ventricular internal cavity size was normal  in size. There is no left ventricular hypertrophy. Left ventricular diastolic parameters are consistent with Grade I diastolic dysfunction (impaired relaxation). Right Ventricle: The right ventricular size is normal. No increase in right ventricular wall thickness. Right ventricular systolic function is normal. Left Atrium: Left atrial size was normal in size. Right Atrium: Right atrial size was normal in size. Pericardium: There is no evidence of pericardial effusion. Mitral Valve: The mitral valve is abnormal. There is mild  thickening of the mitral valve leaflet(s). There is mild calcification of the mitral valve leaflet(s). Mild mitral annular calcification. Trivial mitral valve regurgitation. No evidence of mitral valve stenosis. Tricuspid Valve: The tricuspid valve is normal in structure. Tricuspid valve regurgitation is not demonstrated. No evidence of tricuspid stenosis. Aortic Valve: The aortic valve is normal in structure. Aortic valve regurgitation is not visualized. No aortic stenosis is present. Aortic valve mean gradient measures 7.0 mmHg. Aortic valve peak gradient measures 12.2 mmHg. Aortic valve area, by VTI measures 1.80 cm. Pulmonic Valve: The pulmonic valve was normal in structure. Pulmonic valve regurgitation is not visualized. No evidence of pulmonic stenosis. Aorta: The aortic root is normal in size and structure. Venous: The inferior vena cava is normal in size with greater than 50% respiratory variability, suggesting right atrial pressure of 3 mmHg. IAS/Shunts: No atrial level shunt detected by color flow Doppler.  LEFT VENTRICLE PLAX 2D LVOT diam:     1.90 cm   Diastology LV SV:         49        LV e' medial:  5.66 cm/s LV SV Index:   34        LV e' lateral: 11.00 cm/s LVOT Area:     2.84 cm  RIGHT VENTRICLE             IVC RV Basal diam:  2.60 cm     IVC diam: 1.70 cm RV S prime:     16.30 cm/s LEFT ATRIUM             Index        RIGHT ATRIUM           Index LA Vol (A2C):   47.3 ml 33.02 ml/m  RA Area:     12.10 cm LA Vol (A4C):   35.5 ml 24.78 ml/m  RA Volume:   24.40 ml  17.03 ml/m LA Biplane Vol: 40.9 ml 28.55 ml/m  AORTIC VALVE                     PULMONIC VALVE AV Area (Vmax):    1.80 cm      PV Vmax:       1.02 m/s AV Area (Vmean):   1.65 cm      PV Peak  grad:  4.2 mmHg AV Area (VTI):     1.80 cm AV Vmax:           175.00 cm/s AV Vmean:          125.000 cm/s AV VTI:            0.273 m AV Peak Grad:      12.2 mmHg AV Mean Grad:      7.0 mmHg LVOT Vmax:         111.00 cm/s LVOT Vmean:         72.800 cm/s LVOT VTI:          0.173 m LVOT/AV VTI ratio: 0.63  AORTA Ao Root diam: 2.80 cm  SHUNTS Systemic VTI:  0.17 m Systemic Diam: 1.90 cm Jenkins Rouge MD Electronically signed by Jenkins Rouge MD Signature Date/Time: 11/25/2021/4:31:16 PM    Final    ECHO TEE  Result Date: 11/29/2021    TRANSESOPHOGEAL ECHO REPORT   Patient Name:   SHAMIAH KAHLER Date of Exam: 11/29/2021 Medical Rec #:  [id number]      Height:       62.0 in Accession #:    [id number]     Weight:       101.7 lb Date of Birth:  06-Apr-1966       BSA:          1.434 m Patient Age:    7 years       BP:           97/57 mmHg Patient Gender: F              HR:           75 bpm. Exam Location:  Inpatient Procedure: Transesophageal Echo, Cardiac Doppler, Color Doppler and Saline            Contrast Bubble Study Indications:     Bacteremia  History:         Patient has prior history of Echocardiogram examinations, most                  recent 11/25/2021. Risk Factors:Hypertension and Current Smoker.                  CKD. Polysubstance abuse.  Sonographer:     Clayton Lefort RDCS (AE) Referring Phys:  [id number] DAYNA N DUNN Diagnosing Phys: Godfrey Pick Tobb DO PROCEDURE: After discussion of the risks and benefits of a TEE, an informed consent was obtained from the patient. The transesophogeal probe was passed without difficulty through the esophogus of the patient. Local oropharyngeal anesthetic was provided with viscous lidocaine. Sedation performed by different physician. The patient was monitored while under deep sedation. Anesthestetic sedation was provided intravenously by Anesthesiology: 193.62mg  of Propofol. Image quality was good. The patient's vital  signs; including heart rate, blood pressure, and oxygen saturation; remained stable throughout the procedure. The patient developed no complications during the procedure. IMPRESSIONS  1. Left ventricular ejection fraction, by estimation, is 60 to 65%. The left ventricle has normal function. The left ventricle has  no regional wall motion abnormalities.  2. Right ventricular systolic function is normal. The right ventricular size is normal.  3. No left atrial/left atrial appendage thrombus was detected. The LAA emptying velocity was 49 cm/s.  4. The mitral valve is normal in structure. Mild mitral valve regurgitation. No evidence of mitral stenosis.  5. The aortic valve is normal in structure. Aortic valve regurgitation is not visualized. No aortic  stenosis is present.  6. There is mild (Grade II) protruding and layered plaque involving the descending aorta.  7. The inferior vena cava is normal in size with greater than 50% respiratory variability, suggesting right atrial pressure of 3 mmHg. Conclusion(s)/Recommendation(s): No evidence of vegetation/infective endocarditis on this transesophageael echocardiogram. FINDINGS  Left Ventricle: Left ventricular ejection fraction, by estimation, is 60 to 65%. The left ventricle has normal function. The left ventricle has no regional wall motion abnormalities. The left ventricular internal cavity size was normal in size. There is  no left ventricular hypertrophy. Right Ventricle: The right ventricular size is normal. No increase in right ventricular wall thickness. Right ventricular systolic function is normal. Left Atrium: Left atrial size was normal in size. No left atrial/left atrial appendage thrombus was detected. The LAA emptying velocity was 49 cm/s. Right Atrium: Right atrial size was normal in size. Pericardium: There is no evidence of pericardial effusion. Mitral Valve: The mitral valve is normal in structure. Mild mitral valve regurgitation. No evidence of mitral valve stenosis. Tricuspid Valve: The tricuspid valve is normal in structure. Tricuspid valve regurgitation is not demonstrated. No evidence of tricuspid stenosis. Aortic Valve: The aortic valve is normal in structure. Aortic valve regurgitation is not visualized. No aortic stenosis is present. Pulmonic Valve: The  pulmonic valve was normal in structure. Pulmonic valve regurgitation is trivial. No evidence of pulmonic stenosis. Aorta: The aortic root is normal in size and structure. There is mild (Grade II) protruding and layered plaque involving the descending aorta. Venous: The left upper pulmonary vein, left lower pulmonary vein, right upper pulmonary vein and right lower pulmonary vein are normal. A normal flow pattern is recorded from the left upper pulmonary vein. The inferior vena cava is normal in size with greater than 50% respiratory variability, suggesting right atrial pressure of 3 mmHg. IAS/Shunts: No atrial level shunt detected by color flow Doppler. Agitated saline contrast was given intravenously to evaluate for intracardiac shunting.   AORTA Ao Root diam: 3.10 cm Ao Asc diam:  2.80 cm Kardie Tobb DO Electronically signed by Berniece Salines DO Signature Date/Time: 11/29/2021/3:44:41 PM    Final    DG HIP PORT UNILAT WITH PELVIS 1V RIGHT  Result Date: 11/26/2021 CLINICAL DATA:  Pain right hip EXAM: DG HIP (WITH OR WITHOUT PELVIS) 1V PORT RIGHT COMPARISON:  None. FINDINGS: No definite recent fracture or dislocation is seen. Severe degenerative changes are noted in the right hip with obliteration of joint space, large bony spurs and subcortical cysts. Severe degenerative changes are also noted to a lesser extent in the left hip. IMPRESSION: No recent fracture or dislocation is seen. 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   On: 11/26/2021 13:56    Microbiology: Results for orders placed or performed during the hospital encounter of 11/25/21  Culture, blood (Routine x 2)     Status: Abnormal   Collection Time: 11/25/21  1:58 AM   Specimen: BLOOD  Result Value Ref Range Status   Specimen Description BLOOD RIGHT ANTECUBITAL  Final   Special Requests   Final    BOTTLES DRAWN AEROBIC AND ANAEROBIC Blood Culture adequate volume   Culture  Setup  Time   Final    GRAM POSITIVE COCCI IN CLUSTERS IN BOTH AEROBIC AND ANAEROBIC BOTTLES Organism ID to follow CRITICAL RESULT CALLED TO, READ BACK BY AND VERIFIED WITH: J. FRENS PHARMD, AT 0109 11/25/21 D. [facility] Performed at [facility],  [address], [place], [place] [address]    Culture METHICILLIN RESISTANT STAPHYLOCOCCUS AUREUS (A)  Final   Report Status 11/27/2021 FINAL  Final   Organism ID, Bacteria METHICILLIN RESISTANT STAPHYLOCOCCUS AUREUS  Final      Susceptibility   Methicillin resistant staphylococcus aureus - MIC*    CIPROFLOXACIN >=8 RESISTANT Resistant     ERYTHROMYCIN >=8 RESISTANT Resistant     GENTAMICIN <=0.5 SENSITIVE Sensitive     OXACILLIN >=4 RESISTANT Resistant     TETRACYCLINE <=1 SENSITIVE Sensitive     VANCOMYCIN <=0.5 SENSITIVE Sensitive     TRIMETH/SULFA >=320 RESISTANT Resistant     CLINDAMYCIN <=0.25 SENSITIVE Sensitive     RIFAMPIN <=0.5 SENSITIVE Sensitive     Inducible Clindamycin NEGATIVE Sensitive     * METHICILLIN RESISTANT STAPHYLOCOCCUS AUREUS  Blood Culture ID Panel (Reflexed)     Status: Abnormal   Collection Time: 11/25/21  1:58 AM  Result Value Ref Range Status   Enterococcus faecalis NOT DETECTED NOT DETECTED Final   Enterococcus Faecium NOT DETECTED NOT DETECTED Final   Listeria monocytogenes NOT DETECTED NOT DETECTED Final   Staphylococcus species DETECTED (A) NOT DETECTED Final    Comment: CRITICAL RESULT CALLED TO, READ BACK BY AND VERIFIED WITH: J. FRENS PHARMD, AT 5176 11/25/21 D. [facility]    Staphylococcus aureus (BCID) DETECTED (A) NOT DETECTED Final    Comment: Methicillin (oxacillin)-resistant Staphylococcus aureus (MRSA). MRSA is predictably resistant to beta-lactam antibiotics (except ceftaroline). Preferred therapy is vancomycin unless clinically contraindicated. Patient requires contact precautions if  hospitalized. CRITICAL RESULT CALLED TO, READ BACK BY AND VERIFIED WITH: J. FRENS PHARMD, AT 1607 11/25/21 D.  [facility]    Staphylococcus epidermidis NOT DETECTED NOT DETECTED Final   Staphylococcus lugdunensis NOT DETECTED NOT DETECTED Final   Streptococcus species NOT DETECTED NOT DETECTED Final   Streptococcus agalactiae NOT DETECTED NOT DETECTED Final   Streptococcus pneumoniae NOT DETECTED NOT DETECTED Final   Streptococcus pyogenes NOT DETECTED NOT DETECTED Final   A.calcoaceticus-baumannii NOT DETECTED NOT DETECTED Final   Bacteroides fragilis NOT DETECTED NOT DETECTED Final   Enterobacterales NOT DETECTED NOT DETECTED Final   Enterobacter cloacae complex NOT DETECTED NOT DETECTED Final   Escherichia coli NOT DETECTED NOT DETECTED Final   Klebsiella aerogenes NOT DETECTED NOT DETECTED Final   Klebsiella oxytoca NOT DETECTED NOT DETECTED Final   Klebsiella pneumoniae NOT DETECTED NOT DETECTED Final   Proteus species NOT DETECTED NOT DETECTED Final   Salmonella species NOT DETECTED NOT DETECTED Final   Serratia marcescens NOT DETECTED NOT DETECTED Final   Haemophilus influenzae NOT DETECTED NOT DETECTED Final   Neisseria meningitidis NOT DETECTED NOT DETECTED Final   Pseudomonas aeruginosa NOT DETECTED NOT DETECTED Final   Stenotrophomonas maltophilia NOT DETECTED NOT DETECTED Final   Candida albicans NOT DETECTED NOT DETECTED Final   Candida auris NOT DETECTED NOT DETECTED Final   Candida glabrata NOT DETECTED NOT DETECTED Final   Candida krusei NOT DETECTED NOT DETECTED Final   Candida parapsilosis NOT DETECTED NOT DETECTED Final   Candida tropicalis NOT DETECTED NOT DETECTED Final   Cryptococcus neoformans/gattii NOT DETECTED NOT DETECTED Final   Meth resistant mecA/C and MREJ DETECTED (A) NOT DETECTED Final    Comment: CRITICAL RESULT CALLED TO, READ BACK BY AND VERIFIED WITH: J. FRENS PHARMD, AT 3710 11/25/21 D. [facility] Performed at [facility], [place] [address] [place], [name] [address]   Culture, blood (Routine x 2)     Status: Abnormal   Collection  Time: 11/25/21  2:05 AM    Specimen: BLOOD RIGHT HAND  Result Value Ref Range Status   Specimen Description BLOOD RIGHT HAND  Final   Special Requests   Final    BOTTLES DRAWN AEROBIC AND ANAEROBIC Blood Culture adequate volume   Culture  Setup Time   Final    GRAM POSITIVE COCCI IN BOTH AEROBIC AND ANAEROBIC BOTTLES CRITICAL VALUE NOTED.  VALUE IS CONSISTENT WITH PREVIOUSLY REPORTED AND CALLED VALUE.    Culture (A)  Final    STAPHYLOCOCCUS AUREUS SUSCEPTIBILITIES PERFORMED ON PREVIOUS CULTURE WITHIN THE LAST 5 DAYS. Performed at [facility], [place] [address], [place], [place] [address]    Report Status 11/27/2021 FINAL  Final  Resp Panel by RT-PCR (Flu A&B, Covid) Nasopharyngeal Swab     Status: None   Collection Time: 11/25/21  4:20 AM   Specimen: Nasopharyngeal Swab; Nasopharyngeal(NP) swabs in vial transport medium  Result Value Ref Range Status   SARS Coronavirus 2 by RT PCR NEGATIVE NEGATIVE Final    Comment: (NOTE) SARS-CoV-2 target nucleic acids are NOT DETECTED.  The SARS-CoV-2 RNA is generally detectable in upper respiratory specimens during the acute phase of infection. The lowest concentration of SARS-CoV-2 viral copies this assay can detect is 138 copies/mL. A negative result does not preclude SARS-Cov-2 infection and should not be used as the sole basis for treatment or other patient management decisions. A negative result may occur with  improper specimen collection/handling, submission of specimen other than nasopharyngeal swab, presence of viral mutation(s) within the areas targeted by this assay, and inadequate number of viral copies(<138 copies/mL). A negative result must be combined with clinical observations, patient history, and epidemiological information. The expected result is Negative.  Fact Sheet for Patients:  EntrepreneurPulse.com.au  Fact Sheet for Healthcare Providers:  IncredibleEmployment.be  This test is no t yet approved or  cleared by the [place] FDA and  has been authorized for detection and/or diagnosis of SARS-CoV-2 by FDA under an Emergency Use Authorization (EUA). This EUA will remain  in effect (meaning this test can be used) for the duration of the COVID-19 declaration under Section 564(b)(1) of the Act, 21 U.S.C.section 360bbb-3(b)(1), unless the authorization is terminated  or revoked sooner.       Influenza A by PCR NEGATIVE NEGATIVE Final   Influenza B by PCR NEGATIVE NEGATIVE Final    Comment: (NOTE) The Xpert Xpress SARS-CoV-2/FLU/RSV plus assay is intended as an aid in the diagnosis of influenza from Nasopharyngeal swab specimens and should not be used as a sole basis for treatment. Nasal washings and aspirates are unacceptable for Xpert Xpress SARS-CoV-2/FLU/RSV testing.  Fact Sheet for Patients: EntrepreneurPulse.com.au  Fact Sheet for Healthcare Providers: IncredibleEmployment.be  This test is not yet approved or cleared by the [place] FDA and has been authorized for detection and/or diagnosis of SARS-CoV-2 by FDA under an Emergency Use Authorization (EUA). This EUA will remain in effect (meaning this test can be used) for the duration of the COVID-19 declaration under Section 564(b)(1) of the Act, 21 U.S.C. section 360bbb-3(b)(1), unless the authorization is terminated or revoked.  Performed at [facility], [place] [address], [place], [place] [address]   Varicella-zoster by PCR (Blood or Swab)     Status: None   Collection Time: 11/25/21  9:54 AM   Specimen: Sterile Swab  Result Value Ref Range Status   Varicella-Zoster, PCR Negative Negative Final    Comment: (NOTE) No Varicella Zoster Virus DNA detected.  This test was developed and its performance characteristics determined by [facility].  It has not been cleared or approved by the Food and Drug Administration.  The FDA has determined that such clearance or approval is not  necessary. Performed At: [facility] [place], [place] [id number] Rush Farmer MD JS:[id number]   MRSA Next Gen by PCR, Nasal     Status: Abnormal   Collection Time: 11/25/21  2:00 PM  Result Value Ref Range Status   MRSA by PCR Next Gen DETECTED (A) NOT DETECTED Final    Comment: MELAINIE FLORES RN 11/25/21 @2009  BY JW (NOTE) The GeneXpert MRSA Assay (FDA approved for NASAL specimens only), is one component of a comprehensive MRSA colonization surveillance program. It is not intended to diagnose MRSA infection nor to guide or monitor treatment for MRSA infections. Test performance is not FDA approved in patients less than 24 years old. Performed at [facility], [place] [address] [place], [place] [address]   Culture, blood (Routine X 2) w Reflex to ID Panel     Status: None   Collection Time: 11/27/21  9:54 AM   Specimen: BLOOD LEFT HAND  Result Value Ref Range Status   Specimen Description BLOOD LEFT HAND  Final   Special Requests   Final    BOTTLES DRAWN AEROBIC AND ANAEROBIC Blood Culture adequate volume   Culture   Final    NO GROWTH 5 DAYS Performed at [facility], [place] [address] [place], [place] [address]    Report Status 12/02/2021 FINAL  Final  Culture, blood (Routine X 2) w Reflex to ID Panel     Status: None   Collection Time: 11/27/21  9:54 AM   Specimen: BLOOD RIGHT HAND  Result Value Ref Range Status   Specimen Description BLOOD RIGHT HAND  Final   Special Requests   Final    BOTTLES DRAWN AEROBIC AND ANAEROBIC Blood Culture adequate volume   Culture   Final    NO GROWTH 5 DAYS Performed at [facility], [place] [address], [place], [place] [address]    Report Status 12/02/2021 FINAL  Final  Body fluid culture w Gram Stain     Status: None   Collection Time: 12/02/21  9:16 AM   Specimen: PATH Cytology Misc. fluid; Body Fluid  Result Value Ref Range Status   Specimen Description SYNOVIAL  Final   Special Requests RIGHT  HIP  Final   Gram Stain NO WBC SEEN NO ORGANISMS SEEN   Final   Culture   Final    NO GROWTH 3 DAYS Performed at [facility], [address], [place], [place] [address]    Report Status 12/05/2021 FINAL  Final    Labs: CBC: Recent Labs  Lab 12/09/21 0142  WBC 5.2  NEUTROABS 3.1  HGB 8.3*  HCT 26.8*  MCV 88.7  PLT 709*   Basic Metabolic Panel: Recent Labs  Lab 12/09/21 0142 12/11/21 0821 12/14/21 0246  NA 134* 134* 135  K 4.5 4.5 4.7  CL 102 100 101  CO2 22 24 26   GLUCOSE 110* 91 103*  BUN 45* 41* 45*  CREATININE 1.59* 1.39* 1.59*  CALCIUM 9.0 9.0 9.3   Liver Function Tests: No results for input(s): AST, ALT, ALKPHOS, BILITOT, PROT, ALBUMIN in the last 168 hours. CBG: No results for input(s): GLUCAP in the last 168 hours.  Discharge time spent:  30 minutes.  Signed: Kayleen Memos, DO [facility]  12/15/2021 ° ° ° ° ° ° ° ° °

## 2021-12-13 NOTE — Progress Notes (Signed)
Physical Therapy Treatment Patient Details Name: Sherry Baker MRN: [id number] DOB: [birth year] Today's Date: 12/13/2021   History of Present Illness Pt is a 56 y.o. female initially admitted 11/18/21 with acute metabolic encephalopathy, left AMA 1/21, returned to ED 11/25/21 due to L hand swelling. 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 Pt also with R hip pain; MRI suspicious for septic joint s/p aspiration; recommended THA. PMH includes CKD, colostomy, HTN, polysubstance abuse, malnutrition.    PT Comments    Pt received in supine and agreeable to session. Pt with up to min guard for ambulation in room and hall. Trial of rollator this session with fair results. Pt requiring cues throughout for rollator proximity with pt able to correct but unable to maintain. Pt also needing cues to lock breaks before coming to sit on rollator seat and EOB. Pt with poor recall to lock breaks each time. Educated pt re: Management consultant and benefits of continued mobility. Pt continues to benefit from skilled PT services to progress toward functional mobility goals.    Recommendations for follow up therapy are one component of a multi-disciplinary discharge planning process, led by the attending physician.  Recommendations may be updated based on patient status, additional functional criteria and insurance authorization.  Follow Up Recommendations  No PT follow up     Assistance Recommended at Discharge Intermittent Supervision/Assistance  Patient can return home with the following A little help with bathing/dressing/bathroom;Assist for transportation;Help with stairs or ramp for entrance   Equipment Recommendations  None recommended by PT    Recommendations for Other Services       Precautions / Restrictions Precautions Precautions: Fall;Other (comment) Precaution Comments: colostomy Restrictions Weight Bearing Restrictions: No     Mobility  Bed Mobility Overal bed mobility:  Independent                  Transfers Overall transfer level: Modified independent Equipment used: None, Rollator (4 wheels) Transfers: Sit to/from Stand, Bed to chair/wheelchair/BSC Sit to Stand: Modified independent (Device/Increase time)           General transfer comment: Pt able to come to standing at bed with indep without DME; also mod indep to stand from [facility] to rollator    Ambulation/Gait Ambulation/Gait assistance: Min guard Gait Distance (Feet): 100 Feet Assistive device: Rollator (4 wheels) Gait Pattern/deviations: Decreased step length - right, Decreased stance time - right, Step-to pattern Gait velocity: Decreased     General Gait Details: slow, antalgic gait with rollator and intermittent min guard for balance; cues to maintain closer proximity to rollator. x1 seated recovery break needed   Stairs             Wheelchair Mobility    Modified Rankin (Stroke Patients Only)       Balance Overall balance assessment: Needs assistance Sitting-balance support: Feet supported Sitting balance-Leahy Scale: Good Sitting balance - Comments: indep with pericare sitting on BSC   Standing balance support: Bilateral upper extremity supported, During functional activity, No upper extremity supported Standing balance-Leahy Scale: Fair Standing balance comment: can static stand without UE support                            Cognition Arousal/Alertness: Awake/alert Behavior During Therapy: WFL for tasks assessed/performed Overall Cognitive Status: No family/caregiver present to determine baseline cognitive functioning  General Comments: cognition WFL for simple tasks, not formally assessed. suspect some decreased attention and repetition in conversation        Exercises      General Comments        Pertinent Vitals/Pain Pain Assessment Pain Assessment: 0-10 Pain Score: 8  Pain Location: R  hip Pain Descriptors / Indicators: Grimacing, Guarding Pain Intervention(s): Limited activity within patient's tolerance, Monitored during session, Premedicated before session    Home Living                          Prior Function            PT Goals (current goals can now be found in the care plan section) Acute Rehab PT Goals Patient Stated Goal: return home PT Goal Formulation: With patient Time For Goal Achievement: 12/24/21 Potential to Achieve Goals: Good    Frequency    Min 2X/week      PT Plan Current plan remains appropriate;Equipment recommendations need to be updated    Co-evaluation              AM-PAC PT "6 Clicks" Mobility   Outcome Measure  Help needed turning from your back to your side while in a flat bed without using bedrails?: None Help needed moving from lying on your back to sitting on the side of a flat bed without using bedrails?: None Help needed moving to and from a bed to a chair (including a wheelchair)?: None Help needed standing up from a chair using your arms (e.g., wheelchair or bedside chair)?: None Help needed to walk in hospital room?: A Little Help needed climbing 3-5 steps with a railing? : A Little 6 Click Score: 22    End of Session Equipment Utilized During Treatment: Gait belt Activity Tolerance: Patient tolerated treatment well Patient left: in bed;with call bell/phone within reach Nurse Communication: Mobility status PT Visit Diagnosis: Other abnormalities of gait and mobility (R26.89);Pain Pain - Right/Left: Right Pain - part of body: Hip     Time: 1510-1526 PT Time Calculation (min) (ACUTE ONLY): 16 min  Charges:  $Gait Training: 8-22 mins                     Rosario Kushner R. PTA Acute Rehabilitation Services Office: [place] 12/13/2021, 3:40 PM

## 2021-12-14 DIAGNOSIS — L03114 Cellulitis of left upper limb: Secondary | ICD-10-CM | POA: Diagnosis not present

## 2021-12-14 LAB — BASIC METABOLIC PANEL
Anion gap: 8 (ref 5–15)
BUN: 45 mg/dL — ABNORMAL HIGH (ref 6–20)
CO2: 26 mmol/L (ref 22–32)
Calcium: 9.3 mg/dL (ref 8.9–10.3)
Chloride: 101 mmol/L (ref 98–111)
Creatinine, Ser: 1.59 mg/dL — ABNORMAL HIGH (ref 0.44–1.00)
GFR, Estimated: 38 mL/min — ABNORMAL LOW (ref >60)
Glucose, Bld: 103 mg/dL — ABNORMAL HIGH (ref 70–99)
Potassium: 4.7 mmol/L (ref 3.5–5.1)
Sodium: 135 mmol/L (ref 135–145)

## 2021-12-14 MED ORDER — DIPHENHYDRAMINE HCL 25 MG PO CAPS
25.0000 mg | ORAL_CAPSULE | Freq: Four times a day (QID) | ORAL | Status: DC | PRN
  Administered 2021-12-14: 25 mg via ORAL
  Filled 2021-12-14 (×2): qty 1

## 2021-12-14 NOTE — Progress Notes (Signed)
Progress Note   Patient: Sherry Baker KKX:[id number] DOB: 02-16-1966 DOA: 11/25/2021     19 DOS: the patient was seen and examined on 12/14/2021   56 y.o. female with medical history significant for polysubstance abuse, including cocaine use as well as alcohol abuse, type I bipolar disorder, central hypertension, generalized anxiety disorder, stage IIIb chronic kidney disease with associated baseline creatinine 1.3-1.6, chronic anemia with baseline hemoglobin 10-12, who was admitted to [facility] on 11/18/2021 with acute metabolic encephalopathy after presenting from home to [facility] ED for evaluation of altered mental status.  Recently hospitalized from December 22 to January 4 for altered mental status.  At that time also she was on Precedex.  She went into respiratory failure had to be intubated.  Supposed to discharge to skilled nursing facility which the patient eventually declined and went home.  Presented back on 1/16 with altered mental status.  Again placed on Precedex infusion in the ICU.  Was found to have maculopapular rash which became pustular.  Etiology was unclear.  Left AMA from the hospital on 11/23/2021.  Patient return to the ER due to swelling of her hand.  There was also concern that the rash on her back could be from monkeypox.  Patient was hospitalized for further management.  Subsequently noted to have MRSA bacteremia.  Infectious disease was consulted.   Assessment and Plan: * Cellulitis of left hand- (present on admission) Resolved Imaging without evidence of bony infection but did demonstrate soft tissue infection Evaluated by orthopedic team with no additional recommendations Continue antibiotics per ID recommendation-patient will start oral doxycycline after discharge on 2/14 for total of 3 weeks duration She will need to follow-up with the ID clinic after discharge.  An ambulatory referral will be sent.  Chronic Sacral wound, subsequent encounter Pressure Injury POA:  Yes Measurement:4 cm x 1 cm x 0.5 cm  Dressing procedure/placement/frequency: Cleanse sacral wound with NS and pat dry. Apply alginate to dead space (LAWSON # [id number]) COver with sacral foam to pad and protect  Change every other day.  Recommend continue wound care after discharge.   Right hip pain causing ambulatory dysfunction- (present on admission) -x ray negative but MRI revealed degenerative changes of bilateral hips with a small to moderate-sized right hip joint effusion likely degenerative and reactive -- there was also intramuscular edema within the bilateral iliac [place] musculature within the adductor compartments of the bilateral hips right worse than left -Evaluated by orthopedic team and status post fluoroscopy guided needle aspiration of fluid collection demonstrated no growth x3 days -Continue Lyrica,  Robaxin and as needed Oxy IR -Patient aware that narcotics will not be increased and expectation is to wean completely from narcotics shortly after discharge -Continue to Ultram prn -Continue scheduled Tylenol every 12 hours -GFR too low to allow for NSAIDs -Plan outpatient PT once discharged -2/10 add lidocaine patch to bilateral hips Continue all of the above medications at discharge   MRSA bacteremia w/ associated pustular rash- (present on admission) -2/2 IVDU.  -ID following.   -Echocardiogram does not show any obvious vegetations.   -TEE negative -repeat BC NGTD -Complete antibiotics on 2/12 and discharged home.  She will begin oral doxycycline on 2/14 for a total of 3 weeks duration  Colostomy in place [facility]) Seen by ostomy nurse.  CKD (chronic kidney disease), stage III ([place])- (present on admission) Chronic kidney disease stage IIIb Hyponatremia Renal function at usual baseline Sodium stable between 134 and 135 2/8 BUN 41,  creatinine  of 1.39    Hyponatremia -stable low -continue to monitor  Polysubstance abuse with associated depression and anxiety- (present  on admission) -known history of IV drug use and polysubstance abuse.   -Urine drug screen positive for benzodiazepine and cocaine -continue to encourage cessation  Continue BuSpar and Celexa  GERD (gastroesophageal reflux disease) Chronic. Continue PPI after discharge  Hypertension- (present on admission) SBP in the 100s on 2/7 therefore Norvasc DC'd Continue carvedilol As of 2/8 BP remains stable off of Norvasc  Anemia- (present on admission) Chronic for the most part.   Hemoglobin 8.3 on 2/6  Severe malnutrition ([place])- (present on admission) Encourage oral intake. Continue Ensure supplementation as well as mineral replacement Estimated body mass index is 18 kg/m as calculated from the following:   Height as of this encounter: 5\' 2"  (1.575 m).   Weight as of this encounter: 44.6 kg.        Subjective:  Seen at bedside.  No new complaints.  Physical Exam: Vitals:   12/13/21 1649 12/13/21 2159 12/14/21 0616 12/14/21 0818  BP: 132/70 114/67 119/67 123/70  Pulse: 93 84 72 75  Resp:  16 18 18   Temp: 98 F (36.7 C) 98.3 F (36.8 C) (!) 97.5 F (36.4 C) 98.6 F (37 C)  TempSrc: Oral Oral Oral Axillary  SpO2: 100% 97% 99% 98%  Weight:      Height:       General: Calm, no acute distress Pulmonary: History lung sounds are clear to auscultation, stable on room air with normal work of breathing Cardiac: S1-S2, slightly suboptimal BP readings, no peripheral edema, regular pulse Abdomen: flat and soft nondistended ,nontender.  Bowel sounds present.  LBM 2/9-colostomy present and functional Extremities: Symmetrical in appearance without any obvious effusions or joint erythema Skin: Multiple maculopapular lesions throughout the back diffusely that are in the healing stages without drainage or redness Neurological: Radial nerves II through XII grossly intact, strength throughout 4/5.  Sensation intact Psych: Alert and oriented x3  Data Reviewed: There are no new results  to review at this time.   DVT Prophylaxis   ., Scds  Enoxaparin (lovenox) injection 30 mg    Family Communication:  Patient only  Disposition: Remains inpatient appropriate because:  Unsafe discharged plan; patient known IVDU and not an appropriate candidate for home IV antibiotics via PICC line.     A physical therapy consult is indicated based on the patients mobility assessment.   Mobility Assessment (last 72 hours)     Mobility Assessment     Row Name 12/14/21 0800 12/13/21 1500 12/11/21 2030   Does patient have an order for bedrest or is patient medically unstable No - Continue assessment -- No - Continue assessment   What is the highest level of mobility based on the progressive mobility assessment? Level 5 (Walks with assist in room/Mikaya Bunner) - Balance while stepping forward/back and can walk in room with assist - Complete Level 6 (Walks independently in room and Christien Frankl) - Balance while walking in room without assist - Complete Level 5 (Walks with assist in room/Jayko Voorhees) - Balance while stepping forward/back and can walk in room with assist - Complete   Is the above level different from baseline mobility prior to current illness? Yes - Recommend PT order -- Yes - Recommend PT order              Planned Discharge Destination:  Home  COVID vaccination status:  Moderna 09/30/2021  Consultants: Psychiatry Infectious disease Orthopedic team Procedures:  Echocardiogram TEE DG guided fluoroscopic right hip aspiration and injection Antibiotics: Ceftriaxone 1/22 Vancomycin 1/22 Vancomycin 1/24 >> note having alternate day administration due to low creatinine clearance     Time spent: 15 minutes  Author: Kayleen Memos, DO 12/14/2021 11:31 AM  For on call review [email or web address].

## 2021-12-14 NOTE — Progress Notes (Signed)
Mobility Specialist: Progress Note ° ° 12/14/21 1454  °Mobility  °Activity Ambulated with assistance in hallway  °Level of Assistance Modified independent, requires aide device or extra time  °Assistive Device Front wheel walker  °Distance Ambulated (ft) 150 ft  °Activity Response Tolerated well  °$Mobility charge 1 Mobility  ° °Received pt in bed having no complaints and agreeable to mobility. Asymptomatic throughout ambulation, returned back to bed w/ call bell in reach and all needs met. ° °  °Mobility Specialist °Mobility Specialist 5 North: [phone] °Mobility Specialist 6 North: [phone] ° °

## 2021-12-15 DIAGNOSIS — L03114 Cellulitis of left upper limb: Secondary | ICD-10-CM | POA: Diagnosis not present

## 2021-12-15 NOTE — TOC Transition Note (Signed)
Transition of Care [facility]) - CM/SW Discharge Note   Patient Details  Name: Sherry Baker MRN: [id number] Date of Birth: 05-12-1966  Transition of Care [facility]) CM/SW Contact:  Bartholomew Crews, RN Phone Number: [phone] 12/15/2021, 9:42 AM   Clinical Narrative:     Patient to transition home today. Notified by nursing that patient is requesting bsc. DME order placed by MD. Referral to [employer] for delivery to the room.   Final next level of care: Home/Self Care Barriers to Discharge: No Barriers Identified   Patient Goals and CMS Choice   CMS Medicare.gov Compare Post Acute Care list provided to:: Patient Choice offered to / list presented to : Patient  Discharge Placement                       Discharge Plan and Services In-house Referral: Clinical Social Work Discharge Planning Services: CM Consult, [facility] (Patient to schedule [facility] appointment since note in chart stating pt with history of "no show" for clinic appointments.)            DME Arranged: 3-N-1 DME Agency: [employer] Date DME Agency Contacted: 12/15/21 Time DME Agency Contacted: [phone] Representative spoke with at DME Agency: [place] Interventions     Readmission Risk Interventions Readmission Risk Prevention Plan 12/10/2021 11/15/2021 09/30/2021  Transportation Screening Complete Complete Complete  Medication Review Press photographer) Complete Complete Complete  PCP or Specialist appointment within 3-5 days of discharge Complete Complete Complete  HRI or Home Care Consult Not Complete - Not Complete  HRI or Home Care Consult Pt Refusal Comments history of unsafe home environment - -  SW Recovery Care/Counseling Consult Complete - Complete  Palliative Care Screening Not Applicable Not Applicable Not [place] Not Applicable Not Applicable Not Applicable  Some recent data might be hidden

## 2021-12-15 NOTE — Progress Notes (Signed)
Mobility Specialist: Progress Note   12/15/21 1002  Mobility  Activity Refused mobility   Pt refused mobility d/t RLE pain. Pt hopeful for discharge soon.   [facility] Laurabeth Yip Mobility Specialist Mobility Specialist 5 North: [phone] Mobility Specialist 6 North: [phone]

## 2022-01-06 ENCOUNTER — Telehealth: Payer: Self-pay

## 2022-01-06 ENCOUNTER — Inpatient Hospital Stay: Payer: Medicaid Other | Admitting: Internal Medicine

## 2022-01-06 NOTE — Telephone Encounter (Signed)
Called patient to reschedule missed appointment this morning. No answer. Left HIPAA-compliant voicemail requesting call back to reschedule. ? ?Binnie Kand, RN  ?

## 2022-01-08 ENCOUNTER — Inpatient Hospital Stay: Payer: Medicaid Other | Admitting: Physician Assistant

## 2022-01-08 NOTE — Progress Notes (Deleted)
Patient ID: Sherry Baker, female   DOB: October 24, 1966, 56 y.o.   MRN: [id number] ? ? ?After hospitalization 1/23-2/10/2022 ?Recommendations at discharge:  ?  ?Patient will need to call the outpatient rehabilitation center on [facility] to schedule outpatient therapy session ?Patient will need to follow-up with [facility] on 01/08/2022 at 10:30 AM for routine posthospital follow-up with your primary care physician ?Patient will need to follow-up with the infectious disease clinic after discharge.  An ambulatory referral will be sent and the clinic will call you with appointment date and time ?  ?Discharge Diagnoses: ?Principal Problem: ?  Cellulitis of left hand ?Active Problems: ?  Severe malnutrition ([place]) ?  Anemia ?  Hypertension ?  Polysubstance abuse with associated depression and anxiety ?  Hyponatremia ?  CKD (chronic kidney disease), stage III ([place]) ?  Colostomy in place [facility]) ?  MRSA bacteremia w/ associated pustular rash ?  Right hip pain causing ambulatory dysfunction ?  Chronic Sacral wound, subsequent encounter ?  ?Resolved Problems: ?  * No resolved hospital problems. * ?  ?  ?Hospital Course: ?56 y.o. female with medical history significant for polysubstance abuse, including cocaine use as well as alcohol abuse, type I bipolar disorder, central hypertension, generalized anxiety disorder, stage IIIb chronic kidney disease with associated baseline creatinine 1.3-1.6, chronic anemia with baseline hemoglobin 10-12, who was admitted to [facility] on 11/18/2021 with acute metabolic encephalopathy after presenting from home to [facility] ED for evaluation of altered mental status.  Recently hospitalized from December 22 to January 4 for altered mental status.  At that time also she was on Precedex.  She went into respiratory failure had to be intubated.  Supposed to discharge to skilled nursing facility which the patient eventually declined and went home.  Presented back on 1/16 with  altered mental status.  Again placed on Precedex infusion in the ICU.  Was found to have maculopapular rash which became pustular.  Etiology was unclear.  Left AMA from the hospital on 11/23/2021.  Patient return to the ER due to swelling of her hand.  There was also concern that the rash on her back could be from monkeypox.  Patient was hospitalized for further management.  Subsequently noted to have MRSA bacteremia.  Infectious disease was consulted. ?  ?Assessment and Plan: ?* Cellulitis of left hand- (present on admission) ?Resolved ?Imaging without evidence of bony infection but did demonstrate soft tissue infection ?Evaluated by orthopedic team with no additional recommendations ?Continue antibiotics per ID recommendation-patient will start oral doxycycline after discharge on 2/14 for total of 3 weeks duration ?She will need to follow-up with the ID clinic after discharge.  An ambulatory referral will be sent. ?  ?Chronic Sacral wound, subsequent encounter ?Pressure Injury POA: Yes ?Measurement:4 cm x 1 cm x 0.5 cm  ?Dressing procedure/placement/frequency: Cleanse sacral wound with NS and pat dry. Apply alginate to dead space (LAWSON # [id number]) COver with sacral foam to pad and protect  Change every other day.  ?Recommend continue wound care after discharge.  ?  ?Right hip pain causing ambulatory dysfunction- (present on admission) ?-x ray negative but MRI revealed degenerative changes of bilateral hips with a small to moderate-sized right hip joint effusion likely degenerative and reactive -- there was also intramuscular edema within the bilateral iliac [place] musculature within the adductor compartments of the bilateral hips right worse than left ?-Evaluated by orthopedic team and status post fluoroscopy guided needle aspiration of fluid collection demonstrated  no growth x3 days ?-Continue Lyrica,  Robaxin and as needed Oxy IR ?-Patient aware that narcotics will not be increased and expectation is to wean  completely from narcotics shortly after discharge ?-Continue to Ultram prn ?-Continue scheduled Tylenol every 12 hours ?-GFR too low to allow for NSAIDs ?-Plan outpatient PT once discharged ?-2/10 add lidocaine patch to bilateral hips ?Continue all of the above medications at discharge ?  ?  ?MRSA bacteremia w/ associated pustular rash- (present on admission) ?-2/2 IVDU.  ?-ID following.   ?-Echocardiogram does not show any obvious vegetations.   ?-TEE negative ?-repeat BC NGTD ?-Complete antibiotics on 2/12 and discharged home.  She will begin oral doxycycline on 2/14 for a total of 3 weeks duration ?  ?Colostomy in place [facility]) ?Seen by ostomy nurse. ?  ?CKD (chronic kidney disease), stage III ([place])- (present on admission) ?Chronic kidney disease stage IIIb ?Hyponatremia ?Renal function at usual baseline ?Sodium stable between 134 and 135 ?2/8 BUN 41,  creatinine of 1.39 ?  ?  ?  ?Hyponatremia ?-stable low ?-continue to monitor ?  ?Polysubstance abuse with associated depression and anxiety- (present on admission) ?-known history of IV drug use and polysubstance abuse.   ?-Urine drug screen positive for benzodiazepine and cocaine ?-continue to encourage cessation  ?Continue BuSpar and Celexa ?  ?GERD (gastroesophageal reflux disease) ?Chronic. ?Continue PPI after discharge ?  ?Hypertension- (present on admission) ?SBP in the 100s on 2/7 therefore Norvasc DC'd ?Continue carvedilol ?As of 2/8 BP remains stable off of Norvasc ?  ?Anemia- (present on admission) ?Chronic for the most part.   ?Hemoglobin 8.3 on 2/6 ?  ?Severe malnutrition ([place])- (present on admission) ?Encourage oral intake. ?Continue Ensure supplementation as well as mineral replacement ?Estimated body mass index is 18 kg/m? as calculated from the following: ?  Height as of this encounter: '5\' 2"'$  (1.575 m). ?  Weight as of this encounter: 44.6 kg. ?  ?

## 2022-01-30 ENCOUNTER — Encounter: Payer: Self-pay | Admitting: Physician Assistant

## 2022-01-30 ENCOUNTER — Telehealth: Payer: Self-pay | Admitting: Physician Assistant

## 2022-01-30 ENCOUNTER — Ambulatory Visit: Payer: Medicaid Other | Attending: Physician Assistant | Admitting: Physician Assistant

## 2022-01-30 VITALS — BP 176/112 | HR 107 | Wt 102.8 lb

## 2022-01-30 DIAGNOSIS — G8929 Other chronic pain: Secondary | ICD-10-CM

## 2022-01-30 DIAGNOSIS — M25551 Pain in right hip: Secondary | ICD-10-CM

## 2022-01-30 DIAGNOSIS — F32A Depression, unspecified: Secondary | ICD-10-CM | POA: Diagnosis not present

## 2022-01-30 DIAGNOSIS — I10 Essential (primary) hypertension: Secondary | ICD-10-CM | POA: Diagnosis present

## 2022-01-30 DIAGNOSIS — Z1331 Encounter for screening for depression: Secondary | ICD-10-CM

## 2022-01-30 DIAGNOSIS — D649 Anemia, unspecified: Secondary | ICD-10-CM

## 2022-01-30 DIAGNOSIS — F1910 Other psychoactive substance abuse, uncomplicated: Secondary | ICD-10-CM

## 2022-01-30 DIAGNOSIS — Z79899 Other long term (current) drug therapy: Secondary | ICD-10-CM | POA: Insufficient documentation

## 2022-01-30 DIAGNOSIS — E871 Hypo-osmolality and hyponatremia: Secondary | ICD-10-CM

## 2022-01-30 DIAGNOSIS — R269 Unspecified abnormalities of gait and mobility: Secondary | ICD-10-CM | POA: Diagnosis not present

## 2022-01-30 DIAGNOSIS — F4323 Adjustment disorder with mixed anxiety and depressed mood: Secondary | ICD-10-CM | POA: Diagnosis not present

## 2022-01-30 MED ORDER — METHOCARBAMOL 500 MG PO TABS: 500.0000 mg | ORAL_TABLET | Freq: Four times a day (QID) | ORAL | 0 refills | Status: DC

## 2022-01-30 MED ORDER — BUSPIRONE HCL 15 MG PO TABS: 15.0000 mg | ORAL_TABLET | Freq: Two times a day (BID) | ORAL | 1 refills | Status: DC

## 2022-01-30 MED ORDER — PREGABALIN 25 MG PO CAPS: 25.0000 mg | ORAL_CAPSULE | Freq: Three times a day (TID) | ORAL | 0 refills | Status: DC

## 2022-01-30 MED ORDER — CITALOPRAM HYDROBROMIDE 10 MG PO TABS: 10.0000 mg | ORAL_TABLET | Freq: Every day | ORAL | 1 refills | Status: DC

## 2022-01-30 MED ORDER — CARVEDILOL 6.25 MG PO TABS: 6.2500 mg | ORAL_TABLET | Freq: Two times a day (BID) | ORAL | 3 refills | Status: DC

## 2022-01-30 NOTE — Progress Notes (Signed)
Patient ID: Sherry Baker, female   DOB: Jun 24, 1966, 56 y.o.   MRN: [id number] ? ? ?Avaley Coop, is a 56 y.o. female ? ?[id number] ? ?[id number] ? ?DOB - 04/20/1966 ? ?Chief Complaint  ?Patient presents with  ? Hospitalization Follow-up  ? Hypertension  ?    ? ?Subjective:  ? ?Sherry Baker is a 56 y.o. female here today for a follow up visit after hospitalization in January for cellulitis of the L hand.  She has also had multiple admissions related to substance abuse and mental health issues.  Uses a walker on and off.  Immediate request is for oxycodone.  She says she is compliant with BP meds but did not take them today.  Denies HA/CP/dizziness SOB.  She feels the depression meds they started her on are helping some.  She has a lot of barriers to health care but does have a bag with her meds and she has all of her prescriptions.  She denies SI/HI.  She is open to counseling and LCSW has been working on helping her with sxn8 housing but patient has been hard to reach.   The cellulitis is resolved ? ?No problems updated. ? ?ALLERGIES: ?Allergies  ?Allergen Reactions  ? Penicillins Hives and Swelling  ?  Has patient had a PCN reaction causing immediate rash, facial/tongue/throat swelling, SOB or lightheadedness with hypotension: Yes ?Has patient had a PCN reaction causing severe rash involving mucus membranes or skin necrosis: No ?Has patient had a PCN reaction that required hospitalization No ?Has patient had a PCN reaction occurring within the last 10 years: Yes ?If all of the above answers are "NO", then may proceed with Cephalosporin use. ?  ? ? ?PAST MEDICAL HISTORY: ?Past Medical History:  ?Diagnosis Date  ? Alcohol abuse   ? Allergy   ? PCNS swelling  ? Arthritis   ? Bipolar 1 disorder ([place])   ? Cancer ([place]) 01/21/2017  ? rectal cancer  ? Cancer [facility])   ? Cirrhosis of liver ([place])   ? COVID-19 virus infection 06/23/2021  ? Delirium tremens ([place]) 06/22/2021  ? Depression   ? Genetic testing 03/24/2017  ? Ms.  Kuhlmann underwent genetic counseling and testing for hereditary cancer syndromes on 02/17/2017. Her results were negative for mutations in all 46 genes analyzed by [facility]'s 46-gene Common Hereditary Cancers Panel. Genes analyzed include: APC, ATM, AXIN2, BARD1, BMPR1A, BRCA1, BRCA2, BRIP1, CDH1, CDKN2A, CHEK2, CTNNA1, DICER1, EPCAM, GREM1, HOXB13, KIT, MEN1, MLH1, MSH2, MSH3, MSH6, MUTYH, NBN,  ? Hypertension   ? Suicidal ideation 02/11/2014  ? ? ?MEDICATIONS AT HOME: ?Prior to Admission medications   ?Medication Sig Start Date End Date Taking? Authorizing Provider  ?carvedilol (COREG) 6.25 MG tablet Take 1 tablet (6.25 mg total) by mouth 2 (two) times daily with a meal. 01/30/22  Yes Megan Hayduk M, PA-C  ?acetaminophen (TYLENOL) 325 MG tablet Take 2 tablets (650 mg total) by mouth every 12 (twelve) hours. 12/13/21   Samella Parr, NP  ?busPIRone (BUSPAR) 15 MG tablet Take 1 tablet (15 mg total) by mouth 2 (two) times daily. 01/30/22   Argentina Donovan, PA-C  ?citalopram (CELEXA) 10 MG tablet Take 1 tablet (10 mg total) by mouth daily. 01/30/22   Argentina Donovan, PA-C  ?diclofenac Sodium (VOLTAREN) 1 % GEL Apply 2 g topically 4 (four) times daily. 12/13/21   Samella Parr, NP  ?feeding supplement (ENSURE ENLIVE / ENSURE PLUS) LIQD Take 237 mLs by mouth 3 (three) times daily between meals.  12/13/21   Samella Parr, NP  ?folic acid (FOLVITE) 1 MG tablet Take 1 tablet (1 mg total) by mouth daily. 12/13/21   Samella Parr, NP  ?hydrOXYzine (ATARAX) 10 MG tablet Take 1 tablet (10 mg total) by mouth 3 (three) times daily as needed for anxiety. 12/13/21   Samella Parr, NP  ?lidocaine (LIDODERM) 5 % Place 2 patches onto the skin daily. Remove & Discard patch within 12 hours or as directed by MD 12/14/21   Samella Parr, NP  ?methocarbamol (ROBAXIN) 500 MG tablet Take 1 tablet (500 mg total) by mouth 4 (four) times daily. 01/30/22   Argentina Donovan, PA-C  ?Multiple Vitamins-Minerals (CERTAVITE/ANTIOXIDANTS)  TABS Take 1 tablet by mouth daily. 12/14/21   Samella Parr, NP  ?oxyCODONE (OXY IR/ROXICODONE) 5 MG immediate release tablet Take 1 tablet (5 mg total) by mouth every 4 (four) hours. 12/13/21   Samella Parr, NP  ?pantoprazole (PROTONIX) 40 MG tablet Take 1 tablet (40 mg total) by mouth daily. 12/14/21   Samella Parr, NP  ?pregabalin (LYRICA) 25 MG capsule Take 1 capsule (25 mg total) by mouth 3 (three) times daily. 01/30/22   Argentina Donovan, PA-C  ?thiamine 100 MG tablet Take 1 tablet (100 mg total) by mouth daily. 12/13/21   Samella Parr, NP  ?traMADol (ULTRAM) 50 MG tablet Take 1 tablet (50 mg total) by mouth every 6 (six) hours as needed for moderate pain. 12/13/21   Samella Parr, NP  ?Vitamin D3 (VITAMIN D) 25 MCG tablet Take 1 tablet (1,000 Units total) by mouth daily. 12/13/21   Samella Parr, NP  ?ziprasidone (GEODON) 40 MG capsule Take 1 capsule (40 mg total) by mouth 2 (two) times daily with a meal. 11/06/11 01/09/12  Daleen Bo, MD  ? ? ?ROS: ?Neg HEENT ?Neg resp ?Neg cardiac ?Neg GI ?Neg GU ?Neg neuro ? ?Objective:  ? ?Vitals:  ? 01/30/22 1119  ?BP: (!) 176/112  ?Pulse: (!) 107  ?SpO2: 100%  ?Weight: 102 lb 12.8 oz (46.6 kg)  ? ?Exam ?General appearance : Awake, alert, not in any distress. Speech Clear but a little slow to respond. Not toxic looking.  Tearful at times.  Appears older than stated age and frail.  Very thin.  ?HEENT: Atraumatic and Normocephalic ?Neck: Supple, no JVD. No cervical lymphadenopathy.  ?Chest: Good air entry bilaterally, CTAB.  No rales/rhonchi/wheezing ?CVS: S1 S2 regular, no murmurs.  ?Extremities: B/L Lower Ext shows no edema, both legs are warm to touch ?Neurology: Awake alert, and oriented X 3, CN II-XII intact, Non focal ?Skin: No Rash, skin is thin ? ?Data Review ?Lab Results  ?Component Value Date  ? HGBA1C 5.2 10/29/2021  ? ? ?Assessment & Plan  ? ?1. Essential hypertension ?Not controlled-will increase dose carvedilol.  Check blood pressures 3 to 5  times week daily and record and bring to next visit.  ?- Comprehensive metabolic panel ?- CBC with Differential/Platelet ?- carvedilol (COREG) 6.25 MG tablet; Take 1 tablet (6.25 mg total) by mouth 2 (two) times daily with a meal.  Dispense: 60 tablet; Refill: 3 ? ?2. Hyponatremia ?- Comprehensive metabolic panel ?- CBC with Differential/Platelet ? ?3. Adjustment disorder with mixed anxiety and depressed mood ?- busPIRone (BUSPAR) 15 MG tablet; Take 1 tablet (15 mg total) by mouth 2 (two) times daily.  Dispense: 60 tablet; Refill: 1 ? ?4. Depression, unspecified depression type ?Positive depression screening.  Will have Asante McCoy reach out with counseling  options ?- busPIRone (BUSPAR) 15 MG tablet; Take 1 tablet (15 mg total) by mouth 2 (two) times daily.  Dispense: 60 tablet; Refill: 1 ?- citalopram (CELEXA) 10 MG tablet; Take 1 tablet (10 mg total) by mouth daily.  Dispense: 30 tablet; Refill: 1 ? ?5. Substance abuse ([place]) ?[place] is the website for narcotics anonymous ?[place] (website) or (985) [phone] is the information for alcoholics anonymous ?Both are free and immediately available for help with alcohol and drug use ? ? ?6. Other chronic pain ?Discussed SA and that we do not prescribe heavy narcotics here. ?- methocarbamol (ROBAXIN) 500 MG tablet; Take 1 tablet (500 mg total) by mouth 4 (four) times daily.  Dispense: 90 tablet; Refill: 0 ?- pregabalin (LYRICA) 25 MG capsule; Take 1 capsule (25 mg total) by mouth 3 (three) times daily.  Dispense: 90 capsule; Refill: 0 ?- Ambulatory referral to Pain Clinic ? ?7. Anemia, unspecified type ?Not taking iron.  It seems asa thought she is taking pantoprazole ?- CBC with Differential/Platelet ?- Iron, TIBC and Ferritin Panel ? ?8. Right hip pain causing ambulatory dysfunction ?- methocarbamol (ROBAXIN) 500 MG tablet; Take 1 tablet (500 mg total) by mouth 4 (four) times daily.  Dispense: 90 tablet; Refill: 0 ?- pregabalin (LYRICA) 25 MG capsule; Take 1  capsule (25 mg total) by mouth 3 (three) times daily.  Dispense: 90 capsule; Refill: 0 ?- Ambulatory referral to Pain Clinic ? ?9. Positive depression screening ?Positive depression screening.  Will have Diona Fanti

## 2022-01-30 NOTE — Telephone Encounter (Signed)
Call placed to patient to discuss her financial concerns and provide appropriate resources  Message left with call back requested to this CM ?

## 2022-01-30 NOTE — Patient Instructions (Addendum)
[employer] [place] is the website for narcotics anonymous ? ?[place] (website) or [phone] is the information for alcoholics anonymous ? ?Both are free and immediately available for help with alcohol and drug use ? ?Check blood pressures 3 to 5 times week daily and record and bring to next visit.  I have increased your dose of carvedilol and sent you a new prescription reflecting the higher dose ?

## 2022-01-31 ENCOUNTER — Other Ambulatory Visit: Payer: Self-pay | Admitting: Physician Assistant

## 2022-01-31 DIAGNOSIS — D649 Anemia, unspecified: Secondary | ICD-10-CM

## 2022-01-31 LAB — CBC WITH DIFFERENTIAL/PLATELET
Basophils Absolute: 0 10*3/uL (ref 0.0–0.2)
Basos: 0 %
EOS (ABSOLUTE): 0.1 10*3/uL (ref 0.0–0.4)
Eos: 2 %
Hematocrit: 39.6 % (ref 34.0–46.6)
Hemoglobin: 12.4 g/dL (ref 11.1–15.9)
Immature Grans (Abs): 0 10*3/uL (ref 0.0–0.1)
Immature Granulocytes: 1 %
Lymphocytes Absolute: 1.5 10*3/uL (ref 0.7–3.1)
Lymphs: 24 %
MCH: 26.4 pg — ABNORMAL LOW (ref 26.6–33.0)
MCHC: 31.3 g/dL — ABNORMAL LOW (ref 31.5–35.7)
MCV: 84 fL (ref 79–97)
Monocytes Absolute: 0.6 10*3/uL (ref 0.1–0.9)
Monocytes: 9 %
Neutrophils Absolute: 3.9 10*3/uL (ref 1.4–7.0)
Neutrophils: 64 %
Platelets: 295 10*3/uL (ref 150–450)
RBC: 4.7 x10E6/uL (ref 3.77–5.28)
RDW: 16.1 % — ABNORMAL HIGH (ref 11.7–15.4)
WBC: 6.2 10*3/uL (ref 3.4–10.8)

## 2022-01-31 LAB — COMPREHENSIVE METABOLIC PANEL
ALT: 7 IU/L (ref 0–32)
AST: 14 IU/L (ref 0–40)
Albumin/Globulin Ratio: 1 — ABNORMAL LOW (ref 1.2–2.2)
Albumin: 4.1 g/dL (ref 3.8–4.9)
Alkaline Phosphatase: 112 IU/L (ref 44–121)
BUN/Creatinine Ratio: 16 (ref 9–23)
BUN: 19 mg/dL (ref 6–24)
Bilirubin Total: <0.2 mg/dL (ref 0.0–1.2)
CO2: 15 mmol/L — ABNORMAL LOW (ref 20–29)
Calcium: 9.3 mg/dL (ref 8.7–10.2)
Chloride: 102 mmol/L (ref 96–106)
Creatinine, Ser: 1.16 mg/dL — ABNORMAL HIGH (ref 0.57–1.00)
Globulin, Total: 4.2 g/dL (ref 1.5–4.5)
Glucose: 73 mg/dL (ref 70–99)
Potassium: 4.2 mmol/L (ref 3.5–5.2)
Sodium: 137 mmol/L (ref 134–144)
Total Protein: 8.3 g/dL (ref 6.0–8.5)
eGFR: 56 mL/min/{1.73_m2} — ABNORMAL LOW (ref >59)

## 2022-01-31 LAB — IRON,TIBC AND FERRITIN PANEL
Ferritin: 30 ng/mL (ref 15–150)
Iron Saturation: 8 % — CL (ref 15–55)
Iron: 34 ug/dL (ref 27–159)
Total Iron Binding Capacity: 425 ug/dL (ref 250–450)
UIBC: 391 ug/dL (ref 131–425)

## 2022-01-31 MED ORDER — IRON (FERROUS SULFATE) 325 (65 FE) MG PO TABS: 325.0000 mg | ORAL_TABLET | Freq: Two times a day (BID) | ORAL | 1 refills | Status: DC

## 2022-02-02 ENCOUNTER — Other Ambulatory Visit: Payer: Self-pay

## 2022-02-02 ENCOUNTER — Emergency Department (HOSPITAL_COMMUNITY): Payer: Medicaid Other

## 2022-02-02 ENCOUNTER — Encounter (HOSPITAL_COMMUNITY): Payer: Self-pay | Admitting: General Surgery

## 2022-02-02 ENCOUNTER — Inpatient Hospital Stay (HOSPITAL_COMMUNITY)
Admission: EM | Admit: 2022-02-02 | Discharge: 2022-02-13 | DRG: 207 | Disposition: A | Discharge status: Home / Self Care | Payer: Medicaid Other | Attending: Internal Medicine | Admitting: Internal Medicine

## 2022-02-02 DIAGNOSIS — S098XXA Other specified injuries of head, initial encounter: Secondary | ICD-10-CM

## 2022-02-02 DIAGNOSIS — K7030 Alcoholic cirrhosis of liver without ascites: Secondary | ICD-10-CM

## 2022-02-02 DIAGNOSIS — R911 Solitary pulmonary nodule: Secondary | ICD-10-CM

## 2022-02-02 DIAGNOSIS — D689 Coagulation defect, unspecified: Secondary | ICD-10-CM | POA: Diagnosis not present

## 2022-02-02 DIAGNOSIS — G9340 Encephalopathy, unspecified: Secondary | ICD-10-CM | POA: Diagnosis not present

## 2022-02-02 DIAGNOSIS — F1910 Other psychoactive substance abuse, uncomplicated: Secondary | ICD-10-CM | POA: Diagnosis not present

## 2022-02-02 DIAGNOSIS — E43 Unspecified severe protein-calorie malnutrition: Secondary | ICD-10-CM | POA: Insufficient documentation

## 2022-02-02 DIAGNOSIS — L8990 Pressure ulcer of unspecified site, unspecified stage: Secondary | ICD-10-CM | POA: Insufficient documentation

## 2022-02-02 DIAGNOSIS — E861 Hypovolemia: Secondary | ICD-10-CM | POA: Diagnosis present

## 2022-02-02 DIAGNOSIS — Z23 Encounter for immunization: Secondary | ICD-10-CM | POA: Diagnosis not present

## 2022-02-02 DIAGNOSIS — J439 Emphysema, unspecified: Secondary | ICD-10-CM | POA: Diagnosis present

## 2022-02-02 DIAGNOSIS — F319 Bipolar disorder, unspecified: Secondary | ICD-10-CM | POA: Diagnosis present

## 2022-02-02 DIAGNOSIS — G928 Other toxic encephalopathy: Secondary | ICD-10-CM | POA: Diagnosis present

## 2022-02-02 DIAGNOSIS — S301XXA Contusion of abdominal wall, initial encounter: Secondary | ICD-10-CM | POA: Diagnosis present

## 2022-02-02 DIAGNOSIS — L89304 Pressure ulcer of unspecified buttock, stage 4: Secondary | ICD-10-CM | POA: Diagnosis not present

## 2022-02-02 DIAGNOSIS — D6489 Other specified anemias: Secondary | ICD-10-CM | POA: Diagnosis present

## 2022-02-02 DIAGNOSIS — I129 Hypertensive chronic kidney disease with stage 1 through stage 4 chronic kidney disease, or unspecified chronic kidney disease: Secondary | ICD-10-CM | POA: Diagnosis present

## 2022-02-02 DIAGNOSIS — L89154 Pressure ulcer of sacral region, stage 4: Secondary | ICD-10-CM | POA: Diagnosis present

## 2022-02-02 DIAGNOSIS — F1010 Alcohol abuse, uncomplicated: Secondary | ICD-10-CM | POA: Diagnosis present

## 2022-02-02 DIAGNOSIS — J9601 Acute respiratory failure with hypoxia: Secondary | ICD-10-CM | POA: Diagnosis not present

## 2022-02-02 DIAGNOSIS — Z6820 Body mass index (BMI) 20.0-20.9, adult: Secondary | ICD-10-CM

## 2022-02-02 DIAGNOSIS — J441 Chronic obstructive pulmonary disease with (acute) exacerbation: Secondary | ICD-10-CM | POA: Diagnosis not present

## 2022-02-02 DIAGNOSIS — Z933 Colostomy status: Secondary | ICD-10-CM

## 2022-02-02 DIAGNOSIS — C20 Malignant neoplasm of rectum: Secondary | ICD-10-CM | POA: Diagnosis present

## 2022-02-02 DIAGNOSIS — J690 Pneumonitis due to inhalation of food and vomit: Secondary | ICD-10-CM | POA: Diagnosis present

## 2022-02-02 DIAGNOSIS — Z91410 Personal history of adult physical and sexual abuse: Secondary | ICD-10-CM

## 2022-02-02 DIAGNOSIS — K5900 Constipation, unspecified: Secondary | ICD-10-CM | POA: Diagnosis present

## 2022-02-02 DIAGNOSIS — Z681 Body mass index (BMI) 19 or less, adult: Secondary | ICD-10-CM | POA: Diagnosis not present

## 2022-02-02 DIAGNOSIS — N1330 Unspecified hydronephrosis: Secondary | ICD-10-CM | POA: Diagnosis present

## 2022-02-02 DIAGNOSIS — F14188 Cocaine abuse with other cocaine-induced disorder: Secondary | ICD-10-CM | POA: Diagnosis present

## 2022-02-02 DIAGNOSIS — F419 Anxiety disorder, unspecified: Secondary | ICD-10-CM | POA: Diagnosis present

## 2022-02-02 DIAGNOSIS — D630 Anemia in neoplastic disease: Secondary | ICD-10-CM | POA: Diagnosis present

## 2022-02-02 DIAGNOSIS — Z781 Physical restraint status: Secondary | ICD-10-CM

## 2022-02-02 DIAGNOSIS — I10 Essential (primary) hypertension: Secondary | ICD-10-CM | POA: Diagnosis not present

## 2022-02-02 DIAGNOSIS — N1832 Chronic kidney disease, stage 3b: Secondary | ICD-10-CM | POA: Diagnosis present

## 2022-02-02 DIAGNOSIS — E876 Hypokalemia: Secondary | ICD-10-CM | POA: Diagnosis present

## 2022-02-02 DIAGNOSIS — S0083XA Contusion of other part of head, initial encounter: Secondary | ICD-10-CM | POA: Diagnosis present

## 2022-02-02 DIAGNOSIS — Z880 Allergy status to penicillin: Secondary | ICD-10-CM

## 2022-02-02 DIAGNOSIS — X58XXXA Exposure to other specified factors, initial encounter: Secondary | ICD-10-CM | POA: Diagnosis present

## 2022-02-02 DIAGNOSIS — F17200 Nicotine dependence, unspecified, uncomplicated: Secondary | ICD-10-CM | POA: Diagnosis present

## 2022-02-02 HISTORY — DX: Bipolar disorder, unspecified: F31.9

## 2022-02-02 HISTORY — DX: Alcohol abuse, uncomplicated: F10.10

## 2022-02-02 HISTORY — DX: Chronic kidney disease, unspecified: N18.9

## 2022-02-02 HISTORY — DX: Other psychoactive substance abuse, uncomplicated: F19.10

## 2022-02-02 HISTORY — DX: Unspecified cirrhosis of liver: K74.60

## 2022-02-02 HISTORY — DX: Cocaine abuse, uncomplicated: F14.10

## 2022-02-02 HISTORY — DX: Essential (primary) hypertension: I10

## 2022-02-02 HISTORY — DX: Malignant neoplasm of rectum: C20

## 2022-02-02 LAB — CBC
HCT: 17.1 % — ABNORMAL LOW (ref 36.0–46.0)
HCT: 39.1 % (ref 36.0–46.0)
Hemoglobin: 5.3 g/dL — CL (ref 12.0–15.0)
Hemoglobin: 12.3 g/dL (ref 12.0–15.0)
MCH: 28.8 pg (ref 26.0–34.0)
MCH: 27 pg (ref 26.0–34.0)
MCHC: 31 g/dL (ref 30.0–36.0)
MCHC: 31.5 g/dL (ref 30.0–36.0)
MCV: 92.9 fL (ref 80.0–100.0)
MCV: 85.9 fL (ref 80.0–100.0)
Platelets: UNDETERMINED 10*3/uL (ref 150–400)
Platelets: 228 10*3/uL (ref 150–400)
RBC: 1.84 MIL/uL — ABNORMAL LOW (ref 3.87–5.11)
RBC: 4.55 MIL/uL (ref 3.87–5.11)
RDW: 17.5 % — ABNORMAL HIGH (ref 11.5–15.5)
RDW: 17.4 % — ABNORMAL HIGH (ref 11.5–15.5)
WBC: 4.7 10*3/uL (ref 4.0–10.5)
WBC: 5.2 10*3/uL (ref 4.0–10.5)
nRBC: 0 % (ref 0.0–0.2)
nRBC: 0 % (ref 0.0–0.2)

## 2022-02-02 LAB — URINALYSIS, ROUTINE W REFLEX MICROSCOPIC
Bacteria, UA: NONE SEEN
Bilirubin Urine: NEGATIVE
Glucose, UA: NEGATIVE mg/dL
Ketones, ur: 5 mg/dL — AB
Leukocytes,Ua: NEGATIVE
Nitrite: NEGATIVE
Protein, ur: >=300 mg/dL — AB
Specific Gravity, Urine: 1.014 (ref 1.005–1.030)
pH: 6 (ref 5.0–8.0)

## 2022-02-02 LAB — COMPREHENSIVE METABOLIC PANEL
ALT: 10 U/L (ref 0–44)
AST: 20 U/L (ref 15–41)
Albumin: 2.7 g/dL — ABNORMAL LOW (ref 3.5–5.0)
Alkaline Phosphatase: 82 U/L (ref 38–126)
Anion gap: 10 (ref 5–15)
BUN: 20 mg/dL (ref 6–20)
CO2: 19 mmol/L — ABNORMAL LOW (ref 22–32)
Calcium: 8.8 mg/dL — ABNORMAL LOW (ref 8.9–10.3)
Chloride: 111 mmol/L (ref 98–111)
Creatinine, Ser: 1.28 mg/dL — ABNORMAL HIGH (ref 0.44–1.00)
GFR, Estimated: 50 mL/min — ABNORMAL LOW (ref >60)
Glucose, Bld: 95 mg/dL (ref 70–99)
Potassium: 3.2 mmol/L — ABNORMAL LOW (ref 3.5–5.1)
Sodium: 140 mmol/L (ref 135–145)
Total Bilirubin: 0.6 mg/dL (ref 0.3–1.2)
Total Protein: 7.1 g/dL (ref 6.5–8.1)

## 2022-02-02 LAB — I-STAT CHEM 8, ED
BUN: 30 mg/dL — ABNORMAL HIGH (ref 6–20)
Calcium, Ion: 1.15 mmol/L (ref 1.15–1.40)
Chloride: 116 mmol/L — ABNORMAL HIGH (ref 98–111)
Creatinine, Ser: 1.4 mg/dL — ABNORMAL HIGH (ref 0.44–1.00)
Glucose, Bld: 102 mg/dL — ABNORMAL HIGH (ref 70–99)
HCT: 48 % — ABNORMAL HIGH (ref 36.0–46.0)
Hemoglobin: 16.3 g/dL — ABNORMAL HIGH (ref 12.0–15.0)
Potassium: 4.4 mmol/L (ref 3.5–5.1)
Sodium: 143 mmol/L (ref 135–145)
TCO2: 20 mmol/L — ABNORMAL LOW (ref 22–32)

## 2022-02-02 LAB — I-STAT ARTERIAL BLOOD GAS, ED
Acid-base deficit: 6 mmol/L — ABNORMAL HIGH (ref 0.0–2.0)
Bicarbonate: 21.3 mmol/L (ref 20.0–28.0)
Calcium, Ion: 1.25 mmol/L (ref 1.15–1.40)
HCT: 37 % (ref 36.0–46.0)
Hemoglobin: 12.6 g/dL (ref 12.0–15.0)
O2 Saturation: 100 %
Patient temperature: 96.5
Potassium: 2.6 mmol/L — CL (ref 3.5–5.1)
Sodium: 141 mmol/L (ref 135–145)
TCO2: 23 mmol/L (ref 22–32)
pCO2 arterial: 48.1 mmHg — ABNORMAL HIGH (ref 32–48)
pH, Arterial: 7.248 — ABNORMAL LOW (ref 7.35–7.45)
pO2, Arterial: 429 mmHg — ABNORMAL HIGH (ref 83–108)

## 2022-02-02 LAB — RAPID URINE DRUG SCREEN, HOSP PERFORMED
Amphetamines: NOT DETECTED
Barbiturates: NOT DETECTED
Benzodiazepines: NOT DETECTED
Cocaine: POSITIVE — AB
Opiates: NOT DETECTED
Tetrahydrocannabinol: NOT DETECTED

## 2022-02-02 LAB — BRAIN NATRIURETIC PEPTIDE: B Natriuretic Peptide: 1149.4 pg/mL — ABNORMAL HIGH (ref 0.0–100.0)

## 2022-02-02 LAB — MRSA NEXT GEN BY PCR, NASAL: MRSA by PCR Next Gen: DETECTED — AB

## 2022-02-02 LAB — TSH: TSH: 1.795 u[IU]/mL (ref 0.350–4.500)

## 2022-02-02 LAB — ACETAMINOPHEN LEVEL: Acetaminophen (Tylenol), Serum: <10 ug/mL — ABNORMAL LOW (ref 10–30)

## 2022-02-02 LAB — SALICYLATE LEVEL: Salicylate Lvl: <7 mg/dL — ABNORMAL LOW (ref 7.0–30.0)

## 2022-02-02 LAB — SAMPLE TO BLOOD BANK

## 2022-02-02 LAB — ETHANOL: Alcohol, Ethyl (B): <10 mg/dL (ref <10)

## 2022-02-02 LAB — CK TOTAL AND CKMB (NOT AT ARMC)
CK, MB: 1 ng/mL (ref 0.5–5.0)
Relative Index: INVALID (ref 0.0–2.5)
Total CK: 34 U/L — ABNORMAL LOW (ref 38–234)

## 2022-02-02 LAB — CORTISOL: Cortisol, Plasma: 20.1 ug/dL

## 2022-02-02 LAB — D-DIMER, QUANTITATIVE: D-Dimer, Quant: 1.1 ug/mL-FEU — ABNORMAL HIGH (ref 0.00–0.50)

## 2022-02-02 LAB — LACTIC ACID, PLASMA: Lactic Acid, Venous: 2.2 mmol/L (ref 0.5–1.9)

## 2022-02-02 LAB — HIV ANTIBODY (ROUTINE TESTING W REFLEX): HIV Screen 4th Generation wRfx: NONREACTIVE

## 2022-02-02 LAB — PROTIME-INR
INR: 1.9 — ABNORMAL HIGH (ref 0.8–1.2)
Prothrombin Time: 21.8 seconds — ABNORMAL HIGH (ref 11.4–15.2)

## 2022-02-02 LAB — HEMOGLOBIN AND HEMATOCRIT, BLOOD
HCT: 34.6 % — ABNORMAL LOW (ref 36.0–46.0)
Hemoglobin: 11.3 g/dL — ABNORMAL LOW (ref 12.0–15.0)

## 2022-02-02 LAB — PROCALCITONIN: Procalcitonin: <0.1 ng/mL

## 2022-02-02 LAB — AMMONIA: Ammonia: 58 umol/L — ABNORMAL HIGH (ref 9–35)

## 2022-02-02 MED ORDER — PROPOFOL 1000 MG/100ML IV EMUL
0.0000 ug/kg/min | INTRAVENOUS | Status: DC
  Administered 2022-02-02: 40 ug/kg/min via INTRAVENOUS

## 2022-02-02 MED ORDER — ROCURONIUM BROMIDE 50 MG/5ML IV SOLN
INTRAVENOUS | Status: AC | PRN
  Administered 2022-02-02: 75 mg via INTRAVENOUS

## 2022-02-02 MED ORDER — IPRATROPIUM-ALBUTEROL 0.5-2.5 (3) MG/3ML IN SOLN
3.0000 mL | RESPIRATORY_TRACT | Status: DC
Start: 2022-02-02 — End: 2022-02-02

## 2022-02-02 MED ORDER — ETOMIDATE 2 MG/ML IV SOLN
INTRAVENOUS | Status: AC | PRN
  Administered 2022-02-02: 20 mg via INTRAVENOUS

## 2022-02-02 MED ORDER — SODIUM CHLORIDE 0.9 % IV SOLN
1.0000 mg | Freq: Every day | INTRAVENOUS | Status: DC
  Administered 2022-02-02 – 2022-02-03 (×2): 1 mg via INTRAVENOUS
  Filled 2022-02-02 (×3): qty 0.2

## 2022-02-02 MED ORDER — IPRATROPIUM-ALBUTEROL 0.5-2.5 (3) MG/3ML IN SOLN
3.0000 mL | RESPIRATORY_TRACT | Status: DC
Start: 2022-02-02 — End: 2022-02-03
  Administered 2022-02-02 – 2022-02-03 (×4): 3 mL via RESPIRATORY_TRACT
  Filled 2022-02-02 (×5): qty 3

## 2022-02-02 MED ORDER — PROPOFOL 1000 MG/100ML IV EMUL
INTRAVENOUS | Status: AC
Start: 2022-02-02 — End: 2022-02-02
  Administered 2022-02-02: 20 ug/kg/min via INTRAVENOUS
  Filled 2022-02-02: qty 100

## 2022-02-02 MED ORDER — LIDOCAINE-EPINEPHRINE (PF) 2 %-1:200000 IJ SOLN
10.0000 mL | Freq: Once | INTRAMUSCULAR | Status: AC
  Administered 2022-02-02: 10 mL
  Filled 2022-02-02: qty 20

## 2022-02-02 MED ORDER — THIAMINE HCL 100 MG/ML IJ SOLN
100.0000 mg | Freq: Every day | INTRAMUSCULAR | Status: DC
  Administered 2022-02-02 – 2022-02-03 (×2): 100 mg via INTRAVENOUS
  Filled 2022-02-02 (×2): qty 2

## 2022-02-02 MED ORDER — SODIUM CHLORIDE 0.9 % IV SOLN
INTRAVENOUS | Status: AC | PRN
  Administered 2022-02-02: 1000 mL via INTRAVENOUS

## 2022-02-02 MED ORDER — MIDAZOLAM HCL 2 MG/2ML IJ SOLN
2.0000 mg | INTRAMUSCULAR | Status: DC | PRN
  Administered 2022-02-02 – 2022-02-03 (×3): 2 mg via INTRAVENOUS

## 2022-02-02 MED ORDER — LACTATED RINGERS IV SOLN: INTRAVENOUS | Status: DC

## 2022-02-02 MED ORDER — FENTANYL 2500MCG IN NS 250ML (10MCG/ML) PREMIX INFUSION
0.0000 ug/h | INTRAVENOUS | Status: DC
  Administered 2022-02-02 – 2022-02-03 (×2): 50 ug/h via INTRAVENOUS
  Filled 2022-02-02: qty 250

## 2022-02-02 MED ORDER — VANCOMYCIN HCL 750 MG/150ML IV SOLN
750.0000 mg | INTRAVENOUS | Status: DC
  Filled 2022-02-02: qty 150

## 2022-02-02 MED ORDER — CHLORHEXIDINE GLUCONATE CLOTH 2 % EX PADS
6.0000 | MEDICATED_PAD | Freq: Every day | CUTANEOUS | Status: DC
  Administered 2022-02-03 – 2022-02-13 (×13): 6 via TOPICAL

## 2022-02-02 MED ORDER — MUPIROCIN 2 % EX OINT
1.0000 "application " | TOPICAL_OINTMENT | Freq: Two times a day (BID) | CUTANEOUS | Status: AC
  Administered 2022-02-02 – 2022-02-07 (×10): 1 via NASAL
  Filled 2022-02-02: qty 22

## 2022-02-02 MED ORDER — ALBUTEROL SULFATE (2.5 MG/3ML) 0.083% IN NEBU
2.5000 mg | INHALATION_SOLUTION | RESPIRATORY_TRACT | Status: DC | PRN
  Administered 2022-02-03: 2.5 mg via RESPIRATORY_TRACT
  Filled 2022-02-02: qty 3

## 2022-02-02 MED ORDER — DOCUSATE SODIUM 50 MG/5ML PO LIQD
100.0000 mg | Freq: Two times a day (BID) | ORAL | Status: DC | PRN
  Administered 2022-02-06: 100 mg

## 2022-02-02 MED ORDER — FENTANYL CITRATE PF 50 MCG/ML IJ SOSY
100.0000 ug | PREFILLED_SYRINGE | INTRAMUSCULAR | Status: DC | PRN
  Administered 2022-02-02 – 2022-02-03 (×3): 100 ug via INTRAVENOUS
  Filled 2022-02-02: qty 2

## 2022-02-02 MED ORDER — PANTOPRAZOLE SODIUM 40 MG IV SOLR
40.0000 mg | Freq: Every day | INTRAVENOUS | Status: DC
  Administered 2022-02-02: 40 mg via INTRAVENOUS
  Filled 2022-02-02: qty 10

## 2022-02-02 MED ORDER — ONDANSETRON HCL 4 MG/2ML IJ SOLN
4.0000 mg | Freq: Four times a day (QID) | INTRAMUSCULAR | Status: DC | PRN
  Administered 2022-02-06 – 2022-02-07 (×2): 4 mg via INTRAVENOUS
  Filled 2022-02-02 (×2): qty 2

## 2022-02-02 MED ORDER — CHLORHEXIDINE GLUCONATE 0.12% ORAL RINSE (MEDLINE KIT)
15.0000 mL | Freq: Two times a day (BID) | OROMUCOSAL | Status: DC
  Administered 2022-02-02 – 2022-02-08 (×13): 15 mL via OROMUCOSAL

## 2022-02-02 MED ORDER — LACTATED RINGERS IV BOLUS
1000.0000 mL | Freq: Once | INTRAVENOUS | Status: AC
  Administered 2022-02-02: 1000 mL via INTRAVENOUS

## 2022-02-02 MED ORDER — FENTANYL 2500MCG IN NS 250ML (10MCG/ML) PREMIX INFUSION
INTRAVENOUS | Status: AC
  Administered 2022-02-02: 50 ug/h via INTRAVENOUS
  Filled 2022-02-02: qty 250

## 2022-02-02 MED ORDER — TETANUS-DIPHTH-ACELL PERTUSSIS 5-2.5-18.5 LF-MCG/0.5 IM SUSY
0.5000 mL | PREFILLED_SYRINGE | Freq: Once | INTRAMUSCULAR | Status: AC
  Administered 2022-02-02: 0.5 mL via INTRAMUSCULAR
  Filled 2022-02-02: qty 0.5

## 2022-02-02 MED ORDER — POTASSIUM CHLORIDE 10 MEQ/100ML IV SOLN
10.0000 meq | INTRAVENOUS | Status: AC
  Administered 2022-02-02 (×6): 10 meq via INTRAVENOUS
  Filled 2022-02-02 (×6): qty 100

## 2022-02-02 MED ORDER — MIDAZOLAM HCL 2 MG/2ML IJ SOLN: 2.0000 mg | INTRAMUSCULAR | Status: DC | PRN

## 2022-02-02 MED ORDER — POLYETHYLENE GLYCOL 3350 17 G PO PACK
17.0000 g | PACK | Freq: Every day | ORAL | Status: DC | PRN
  Filled 2022-02-02: qty 1

## 2022-02-02 MED ORDER — VANCOMYCIN HCL IN DEXTROSE 1-5 GM/200ML-% IV SOLN
1000.0000 mg | Freq: Once | INTRAVENOUS | Status: AC
  Administered 2022-02-02: 1000 mg via INTRAVENOUS
  Filled 2022-02-02: qty 200

## 2022-02-02 MED ORDER — IOHEXOL 350 MG/ML SOLN
100.0000 mL | Freq: Once | INTRAVENOUS | Status: AC | PRN
  Administered 2022-02-02: 100 mL via INTRAVENOUS

## 2022-02-02 MED ORDER — FENTANYL CITRATE PF 50 MCG/ML IJ SOSY
100.0000 ug | PREFILLED_SYRINGE | Freq: Once | INTRAMUSCULAR | Status: AC
  Administered 2022-02-02: 100 ug via INTRAVENOUS
  Filled 2022-02-02: qty 2

## 2022-02-02 MED ORDER — FENTANYL CITRATE (PF) 100 MCG/2ML IJ SOLN
INTRAMUSCULAR | Status: AC
  Filled 2022-02-02: qty 2

## 2022-02-02 MED ORDER — LORAZEPAM 2 MG/ML IJ SOLN
INTRAMUSCULAR | Status: AC
  Administered 2022-02-02: 2 mg via INTRAVENOUS
  Filled 2022-02-02: qty 1

## 2022-02-02 MED ORDER — ORAL CARE MOUTH RINSE
15.0000 mL | OROMUCOSAL | Status: DC
  Administered 2022-02-02 – 2022-02-08 (×60): 15 mL via OROMUCOSAL

## 2022-02-02 MED ORDER — SODIUM CHLORIDE 0.9 % IV SOLN
2.0000 g | INTRAVENOUS | Status: DC
  Administered 2022-02-02 – 2022-02-03 (×2): 2 g via INTRAVENOUS
  Filled 2022-02-02 (×2): qty 2

## 2022-02-02 MED ORDER — LORAZEPAM 2 MG/ML IJ SOLN
2.0000 mg | Freq: Once | INTRAMUSCULAR | Status: AC
Start: 2022-02-02 — End: 2022-02-02

## 2022-02-02 MED ORDER — DOCUSATE SODIUM 100 MG PO CAPS: 100.0000 mg | ORAL_CAPSULE | Freq: Two times a day (BID) | ORAL | Status: DC | PRN

## 2022-02-02 MED ORDER — FENTANYL CITRATE PF 50 MCG/ML IJ SOSY: 100.0000 ug | PREFILLED_SYRINGE | INTRAMUSCULAR | Status: DC | PRN

## 2022-02-02 MED ORDER — MIDAZOLAM-SODIUM CHLORIDE 100-0.9 MG/100ML-% IV SOLN
0.5000 mg/h | INTRAVENOUS | Status: DC
  Administered 2022-02-02: 0.5 mg/h via INTRAVENOUS
  Filled 2022-02-02: qty 100

## 2022-02-02 MED ORDER — ACETAMINOPHEN 325 MG PO TABS
650.0000 mg | ORAL_TABLET | ORAL | Status: DC | PRN
  Administered 2022-02-02 – 2022-02-13 (×3): 650 mg
  Filled 2022-02-02 (×3): qty 2

## 2022-02-02 MED ORDER — FENTANYL CITRATE PF 50 MCG/ML IJ SOSY
50.0000 ug | PREFILLED_SYRINGE | Freq: Once | INTRAMUSCULAR | Status: AC
  Administered 2022-02-02: 50 ug via INTRAVENOUS

## 2022-02-02 NOTE — Sedation Documentation (Addendum)
Pt intubated. 23 at the lip. 7.5 tube. Bilateral breath sounds heard.  ?

## 2022-02-02 NOTE — ED Notes (Signed)
Patient transported to CT 

## 2022-02-02 NOTE — ED Notes (Signed)
Trauma Response Nurse Documentation ? ? ?Sherry Baker is a 56 y.o. female arriving to [facility] ED via EMS ? ?On No antithrombotic. Trauma was activated as a Level 1 by ED Charge RN based on the following trauma criteria Intubated Patients or assisting ventilations. Trauma team at the bedside on patient arrival. Patient cleared for CT by Dr. Kathrynn Humble. Patient to CT with team. GCS 7. ? ?History  ? History reviewed. No pertinent past medical history.  ? History reviewed. No pertinent surgical history.  ? ? ?Initial Focused Assessment (If applicable, or please see trauma documentation): ?- Pt unresponsive  ?- GCS 7 ?- PERRLA ?- Bilateral eye ecchymosis and lacerations ?- nasal trumpet to left nare ?- c-collar in place ?- existing ostomy to L abd. ?- R abd bruising noted ? ?CT's Completed:   ?CT Head, CT Maxillofacial, CT C-Spine, CT Chest w/ contrast, and CT abdomen/pelvis w/ contrast  ? ?Interventions:  ?- 2 PIVs established 20G to L hand and 18G to L AC ?- RSI meds given ?- pt intubated ?- OG tube inserted 25F ?- 47F temp foley inserted ?- LR bolus given ?- FAST exam neg ?- trauma labs drawn ?- CXR ?- Pelvic XR ?- CT pan scan ?- propofol gtt initiated ?- fentanyl gtt initiated ?- CCM consulted ?- tdap given ?- c-collar cleared ?- transported to 4NICU - trauma to sign off ? ?Plan for disposition:  ?Admission to ICU  ? ?Consults completed:  ?none at 1000. - CCM to admit ? ?Event Summary: ?Pt was found down with obvious facial trauma ?MTP Summary (If applicable):  ? ?Bedside handoff with ED RN Kathlee Nations.   ? ?Clovis Cao  ?Trauma Response RN ? ?Please call TRN at [phone] for further assistance. ? ? ?

## 2022-02-02 NOTE — ED Notes (Signed)
C-Collar in place 

## 2022-02-02 NOTE — ED Notes (Signed)
Bear hugger placed

## 2022-02-02 NOTE — Progress Notes (Signed)
Orthopedic Tech Progress Note ?Patient Details:  ?[name] ?[birth year] ?[id number] ? ?Level 1 trauma ? ?Patient ID: Sherry Baker, female   DOB: [birth year], 56 y.o.   MRN: [id number] ? ?Sherry Baker ?02/02/2022, 9:24 AM ? ?

## 2022-02-02 NOTE — ED Provider Notes (Signed)
..  Laceration Repair ? ?Date/Time: 02/02/2022 1:13 PM ?Performed by: Margarita Mail, PA-C ?Authorized by: Margarita Mail, PA-C  ? ?Consent:  ?  Consent obtained:  Emergent situation ?Universal protocol:  ?  Procedure explained and questions answered to patient or proxy's satisfaction: yes   ?  Relevant documents present and verified: yes   ?  Test results available: yes   ?  Imaging studies available: yes   ?  Required blood products, implants, devices, and special equipment available: yes   ?  Site/side marked: yes   ?  Immediately prior to procedure, a time out was called: yes   ?  Patient identity confirmed:  Arm band ?Anesthesia:  ?  Anesthesia method:  Local infiltration ?  Local anesthetic:  Lidocaine 1% w/o epi ?Laceration details:  ?  Location:  Face ?  Face location:  L eyebrow ?  Length (cm):  4 ?Pre-procedure details:  ?  Preparation:  Patient was prepped and draped in usual sterile fashion ?Exploration:  ?  Wound exploration: wound explored through full range of motion and entire depth of wound visualized   ?Treatment:  ?  Area cleansed with:  Povidone-iodine ?  Amount of cleaning:  Standard ?  Irrigation solution:  Sterile water ?  Irrigation method:  Syringe ?Skin repair:  ?  Repair method:  Sutures ?  Suture size:  5-0 ?  Suture material:  Fast-absorbing gut ?  Suture technique:  Simple interrupted ?  Number of sutures:  5 ?Approximation:  ?  Approximation:  Close ?Marland Kitchen.Laceration Repair ? ?Date/Time: 02/04/2022 2:42 PM ?Performed by: Margarita Mail, PA-C ?Authorized by: Margarita Mail, PA-C  ? ?Consent:  ?  Consent obtained:  Emergent situation ?Universal protocol:  ?  Procedure explained and questions answered to patient or proxy's satisfaction: yes   ?  Relevant documents present and verified: yes   ?  Test results available: yes   ?  Imaging studies available: yes   ?  Required blood products, implants, devices, and special equipment available: yes   ?  Site/side marked: yes   ?  Immediately prior to  procedure, a time out was called: yes   ?  Patient identity confirmed:  Verbally with patient ?Anesthesia:  ?  Anesthesia method:  Local infiltration ?  Local anesthetic:  Lidocaine 1% w/o epi ?Laceration details:  ?  Location:  Face ?  Face location:  R eyebrow ?  Length (cm):  2 ?Pre-procedure details:  ?  Preparation:  Patient was prepped and draped in usual sterile fashion ?Exploration:  ?  Wound exploration: wound explored through full range of motion and entire depth of wound visualized   ?Treatment:  ?  Area cleansed with:  Povidone-iodine ?  Amount of cleaning:  Standard ?  Irrigation solution:  Sterile saline ?  Irrigation method:  Syringe ?Skin repair:  ?  Repair method:  Sutures ?  Suture size:  5-0 ?  Suture material:  Fast-absorbing gut ?  Suture technique:  Running locked ?  Number of sutures:  4 ?Approximation:  ?  Approximation:  Close ?Repair type:  ?  Repair type:  Simple  ?  ?Margarita Mail, PA-C ?02/04/22 1444 ? ?  ?Varney Biles, MD ?02/07/22 1332 ? ?

## 2022-02-02 NOTE — Progress Notes (Signed)
Patient transported to CT and back without complications. RN at bedside.  

## 2022-02-02 NOTE — ED Provider Notes (Addendum)
?[facility] ?Provider Note ? ? ?CSN: [id number] ?Arrival date & time: 02/02/22  0840 ? ?  ? ?History ? ?Chief Complaint  ?Patient presents with  ? unresponsive  ? ? ?Sherry Baker is a 56 y.o. female. ? ?HPI ? ?  ?56 year old female with history of polysubstance use disorder, alcoholic liver cirrhosis comes in with chief complaint of being unresponsive. ? ?EMS brings the patient from her home.  Allegedly she was found on the ground, surrounded by beer cans and bleeding.  Per bystanders, patient was noted to be striking her head onto the ground.  EMS indicates that patient has complained of some leg pain, but has not been cooperative with history and is quite somnolent.  They had to put in a nasal trumpet. ? ? ?Home Medications ?Prior to Admission medications   ?Not on File  ?   ? ?Allergies    ?Patient has no allergy information on record.   ? ?Review of Systems   ?Review of Systems ? ?Physical Exam ?Updated Vital Signs ?BP 120/84   Pulse 90   Temp (!) 95.9 ?F (35.5 ?C)   Resp (!) 22   Ht '5\' 1"'$  (1.549 m)   Wt 55 kg   SpO2 96%   BMI 22.91 kg/m?  ?Physical Exam ?Vitals and nursing note reviewed.  ?Constitutional:   ?   Appearance: She is well-developed.  ?   Comments: unresponsive  ?HENT:  ?   Head:  ?   Comments: Left periorbital ecchymosis with to laceration, 1 measuring 2 cm the other one 1 cm -superficial with active bleeding ?Eyes:  ?   Comments: Pupils are 2 mm and equal  ?Neck:  ?   Comments: In a c-collar ?Cardiovascular:  ?   Rate and Rhythm: Normal rate.  ?Pulmonary:  ?   Effort: Pulmonary effort is normal.  ?Abdominal:  ?   Comments: Colostomy bag noted without any blood in it.  Patient has bruising over the left lower quadrant tenderness, that appears to be old  ?Skin: ?   General: Skin is warm.  ?Neurological:  ?   Mental Status: She is disoriented.  ?   Comments: GCS: 8 ?E: 1 ?V: 2 ?M: 5  ? ? ?ED Results / Procedures / Treatments   ?Labs ?(all labs ordered are  listed, but only abnormal results are displayed) ?Labs Reviewed  ?CBC - Abnormal; Notable for the following components:  ?    Result Value  ? RBC 1.84 (*)   ? Hemoglobin 5.3 (*)   ? HCT 17.1 (*)   ? RDW 17.5 (*)   ? All other components within normal limits  ?URINALYSIS, ROUTINE W REFLEX MICROSCOPIC - Abnormal; Notable for the following components:  ? Color, Urine STRAW (*)   ? Hgb urine dipstick SMALL (*)   ? Ketones, ur 5 (*)   ? Protein, ur >=300 (*)   ? All other components within normal limits  ?PROTIME-INR - Abnormal; Notable for the following components:  ? Prothrombin Time 21.8 (*)   ? INR 1.9 (*)   ? All other components within normal limits  ?CK TOTAL AND CKMB (NOT AT [facility]) - Abnormal; Notable for the following components:  ? Total CK 34 (*)   ? All other components within normal limits  ?RAPID URINE DRUG SCREEN, HOSP PERFORMED - Abnormal; Notable for the following components:  ? Cocaine POSITIVE (*)   ? All other components within normal limits  ?AMMONIA - Abnormal;  Notable for the following components:  ? Ammonia 58 (*)   ? All other components within normal limits  ?HEMOGLOBIN AND HEMATOCRIT, BLOOD - Abnormal; Notable for the following components:  ? Hemoglobin 11.3 (*)   ? HCT 34.6 (*)   ? All other components within normal limits  ?I-STAT CHEM 8, ED - Abnormal; Notable for the following components:  ? Chloride 116 (*)   ? BUN 30 (*)   ? Creatinine, Ser 1.40 (*)   ? Glucose, Bld 102 (*)   ? TCO2 20 (*)   ? Hemoglobin 16.3 (*)   ? HCT 48.0 (*)   ? All other components within normal limits  ?I-STAT ARTERIAL BLOOD GAS, ED - Abnormal; Notable for the following components:  ? pH, Arterial 7.248 (*)   ? pCO2 arterial 48.1 (*)   ? pO2, Arterial 429 (*)   ? Acid-base deficit 6.0 (*)   ? Potassium 2.6 (*)   ? All other components within normal limits  ?CULTURE, RESPIRATORY W GRAM STAIN  ?ETHANOL  ?LACTIC ACID, PLASMA  ?BLOOD GAS, ARTERIAL  ?HIV ANTIBODY (ROUTINE TESTING W REFLEX)  ?PROCALCITONIN  ?BRAIN  NATRIURETIC PEPTIDE  ?CORTISOL  ?D-DIMER, QUANTITATIVE  ?CBC  ?COMPREHENSIVE METABOLIC PANEL  ?SAMPLE TO BLOOD BANK  ? ? ?EKG ?EKG Interpretation ? ?Date/Time:  Sunday February 02 2022 11:16:26 EDT ?Ventricular Rate:  92 ?PR Interval:  131 ?QRS Duration: 102 ?QT Interval:  423 ?QTC Calculation: 524 ?R Axis:   95 ?Text Interpretation: Sinus rhythm Biatrial enlargement Borderline right axis deviation Nonspecific repol abnormality, diffuse leads Prolonged QT interval QTc prolongation noted Confirmed by Varney Biles [id number]) on 02/02/2022 11:56:25 AM ? ?Radiology ?CT HEAD WO CONTRAST ? ?Result Date: 02/02/2022 ?CLINICAL DATA:  Poly trauma.  Head trauma.  Abnormal mental status. EXAM: CT HEAD WITHOUT CONTRAST CT MAXILLOFACIAL WITHOUT CONTRAST CT CERVICAL SPINE WITHOUT CONTRAST TECHNIQUE: Multidetector CT imaging of the head, cervical spine, and maxillofacial structures were performed using the standard protocol without intravenous contrast. Multiplanar CT image reconstructions of the cervical spine and maxillofacial structures were also generated. RADIATION DOSE REDUCTION: This exam was performed according to the departmental dose-optimization program which includes automated exposure control, adjustment of the mA and/or kV according to patient size and/or use of iterative reconstruction technique. COMPARISON:  Head CT 11/18/2021 FINDINGS: CT HEAD FINDINGS Brain: There is no evidence for acute hemorrhage, hydrocephalus, mass lesion, or abnormal extra-axial fluid collection. No definite CT evidence for acute infarction. Vascular: No hyperdense vessel or unexpected calcification. Skull: No evidence for fracture. No worrisome lytic or sclerotic lesion. Other: None. CT MAXILLOFACIAL FINDINGS Osseous: No fracture or mandibular dislocation. Old minimally displaced nasal bone fractures evident. No destructive process. Orbits: Negative. No traumatic or inflammatory finding. Sinuses: Chronic mucosal disease noted right maxillary  sinus. Right mastoid effusion evident. Soft tissues: There is some probable contusions/edema in the right frontal scalp and orbital region. CT CERVICAL SPINE FINDINGS Alignment: Straightening of normal cervical lordosis evident. Skull base and vertebrae: No acute fracture. No primary bone lesion or focal pathologic process. Soft tissues and spinal canal: No prevertebral fluid or swelling. No visible canal hematoma. Disc levels: Loss of disc height noted C4-5 and C5-6. Facets are well aligned bilaterally. Upper chest: See dedicated CT chest performed at the same time and reported separately. Other: None. IMPRESSION: 1. No acute intracranial abnormality. 2. Old minimally displaced nasal bone fractures. 3. 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 4. No evidence for acute cervical spine fracture or subluxation. Findings  discussed with Dr. Rosendo Gros at approximately 0957 hours on 02/02/2022. Electronically Signed   By: Misty Stanley M.D.   On: 02/02/2022 10:02  ? ?CT CERVICAL SPINE WO CONTRAST ? ?Result Date: 02/02/2022 ?CLINICAL DATA:  Poly trauma.  Head trauma.  Abnormal mental status. EXAM: CT HEAD WITHOUT CONTRAST CT MAXILLOFACIAL WITHOUT CONTRAST CT CERVICAL SPINE WITHOUT CONTRAST TECHNIQUE: Multidetector CT imaging of the head, cervical spine, and maxillofacial structures were performed using the standard protocol without intravenous contrast. Multiplanar CT image reconstructions of the cervical spine and maxillofacial structures were also generated. RADIATION DOSE REDUCTION: This exam was performed according to the departmental dose-optimization program which includes automated exposure control, adjustment of the mA and/or kV according to patient size and/or use of iterative reconstruction technique. COMPARISON:  Head CT 11/18/2021 FINDINGS: CT HEAD FINDINGS Brain: There is no evidence for acute hemorrhage, hydrocephalus, mass lesion, or abnormal extra-axial fluid collection. No definite CT  evidence for acute infarction. Vascular: No hyperdense vessel or unexpected calcification. Skull: No evidence for fracture. No worrisome lytic or sclerotic lesion. Other: None. CT MAXILLOFACIAL FINDINGS Osseous: No frac

## 2022-02-02 NOTE — ED Triage Notes (Signed)
Pt here via EMS . Pt found on floor. Swelling and bruising noted on face and right side abdomen. Pt protecting airway, responsive to pain. Pt opening eyes on command. Lacerations noted over left eyebrow. EMS reports GCS 7. ? ?220/113 ?HR 60 ?
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
## 2022-02-02 NOTE — Progress Notes (Signed)
Pharmacy Antibiotic Note ? ?Sherry Baker is a 56 y.o. female admitted on 02/02/2022 presenting unresponsive.  Pharmacy has been consulted for vancomycin and cefepime dosing. ? ?Plan: ?Vancomycin 1000 mg IV x 1, then 750 mg IV q 24h (eAUC 528, SCr 1.28) ?Cefepime 2g IV q 24h ?Monitor renal function, Cx and clinical progression to narrow ?Vancomycin levels as needed ? ?Height: '5\' 1"'$  (154.9 cm) ?Weight: 55 kg (121 lb 4.1 oz) ?IBW/kg (Calculated) : 47.8 ? ?Temp (24hrs), Avg:96.4 ?F (35.8 ?C), Min:95.7 ?F (35.4 ?C), Max:98 ?F (36.7 ?C) ? ?Recent Labs  ?Lab 02/02/22 ?1324 02/02/22 ?0911 02/02/22 ?1214 02/02/22 ?1215 02/02/22 ?1216  ?WBC 4.7  --   --  5.2  --   ?CREATININE  --  1.40*  --   --  1.28*  ?LATICACIDVEN  --   --  2.2*  --   --   ?  ?Estimated Creatinine Clearance: 37.9 mL/min (A) (by C-G formula based on SCr of 1.28 mg/dL (H)).   ? ?Not on File ? ?Bertis Ruddy, PharmD ?Clinical Pharmacist ?ED Pharmacist Phone # [phone] ?02/02/2022 1:51 PM ? ? ?

## 2022-02-02 NOTE — ED Notes (Signed)
Delay of transport to ICU d/t provider suturing.  ?

## 2022-02-02 NOTE — ED Provider Notes (Incomplete Revision)
?[facility] ?Provider Note ? ? ?CSN: [id number] ?Arrival date & time: 02/02/22  0840 ? ?  ? ?History ? ?Chief Complaint  ?Patient presents with  ? unresponsive  ? ? ?Sherry Baker is a 56 y.o. female. ? ?HPI ? ?  ?56 year old female with history of polysubstance use disorder, alcoholic liver cirrhosis comes in with chief complaint of being unresponsive. ? ?EMS brings the patient from her home.  Allegedly she was noted to be ? ? ?Home Medications ?Prior to Admission medications   ?Not on File  ?   ? ?Allergies    ?Patient has no allergy information on record.   ? ?Review of Systems   ?Review of Systems ? ?Physical Exam ?Updated Vital Signs ?BP 120/84   Pulse 90   Temp (!) 95.9 ?F (35.5 ?C)   Resp (!) 22   Ht '5\' 1"'$  (1.549 m)   Wt 55 kg   SpO2 96%   BMI 22.91 kg/m?  ?Physical Exam ?Vitals and nursing note reviewed.  ?Constitutional:   ?   Appearance: She is well-developed.  ?   Comments: unresponsive  ?HENT:  ?   Head:  ?   Comments: Left periorbital ecchymosis with to laceration, 1 measuring 2 cm the other one 1 cm -superficial with active bleeding ?Eyes:  ?   Comments: Pupils are 2 mm and equal  ?Neck:  ?   Comments: In a c-collar ?Cardiovascular:  ?   Rate and Rhythm: Normal rate.  ?Pulmonary:  ?   Effort: Pulmonary effort is normal.  ?Abdominal:  ?   Comments: Colostomy bag noted without any blood in it.  Patient has bruising over the left lower quadrant tenderness, that appears to be old  ?Skin: ?   General: Skin is warm.  ?Neurological:  ?   Mental Status: She is disoriented.  ?   Comments: GCS: 8 ?E: 1 ?V: 2 ?M: 5  ? ? ?ED Results / Procedures / Treatments   ?Labs ?(all labs ordered are listed, but only abnormal results are displayed) ?Labs Reviewed  ?CBC - Abnormal; Notable for the following components:  ?    Result Value  ? RBC 1.84 (*)   ? Hemoglobin 5.3 (*)   ? HCT 17.1 (*)   ? RDW 17.5 (*)   ? All other components within normal limits  ?URINALYSIS, ROUTINE W  REFLEX MICROSCOPIC - Abnormal; Notable for the following components:  ? Color, Urine STRAW (*)   ? Hgb urine dipstick SMALL (*)   ? Ketones, ur 5 (*)   ? Protein, ur >=300 (*)   ? All other components within normal limits  ?PROTIME-INR - Abnormal; Notable for the following components:  ? Prothrombin Time 21.8 (*)   ? INR 1.9 (*)   ? All other components within normal limits  ?CK TOTAL AND CKMB (NOT AT [facility]) - Abnormal; Notable for the following components:  ? Total CK 34 (*)   ? All other components within normal limits  ?RAPID URINE DRUG SCREEN, HOSP PERFORMED - Abnormal; Notable for the following components:  ? Cocaine POSITIVE (*)   ? All other components within normal limits  ?AMMONIA - Abnormal; Notable for the following components:  ? Ammonia 58 (*)   ? All other components within normal limits  ?HEMOGLOBIN AND HEMATOCRIT, BLOOD - Abnormal; Notable for the following components:  ? Hemoglobin 11.3 (*)   ? HCT 34.6 (*)   ? All other components within normal limits  ?  I-STAT CHEM 8, ED - Abnormal; Notable for the following components:  ? Chloride 116 (*)   ? BUN 30 (*)   ? Creatinine, Ser 1.40 (*)   ? Glucose, Bld 102 (*)   ? TCO2 20 (*)   ? Hemoglobin 16.3 (*)   ? HCT 48.0 (*)   ? All other components within normal limits  ?I-STAT ARTERIAL BLOOD GAS, ED - Abnormal; Notable for the following components:  ? pH, Arterial 7.248 (*)   ? pCO2 arterial 48.1 (*)   ? pO2, Arterial 429 (*)   ? Acid-base deficit 6.0 (*)   ? Potassium 2.6 (*)   ? All other components within normal limits  ?CULTURE, RESPIRATORY W GRAM STAIN  ?ETHANOL  ?LACTIC ACID, PLASMA  ?BLOOD GAS, ARTERIAL  ?HIV ANTIBODY (ROUTINE TESTING W REFLEX)  ?PROCALCITONIN  ?BRAIN NATRIURETIC PEPTIDE  ?CORTISOL  ?D-DIMER, QUANTITATIVE  ?CBC  ?COMPREHENSIVE METABOLIC PANEL  ?SAMPLE TO BLOOD BANK  ? ? ?EKG ?EKG Interpretation ? ?Date/Time:  Sunday February 02 2022 11:16:26 EDT ?Ventricular Rate:  92 ?PR Interval:  131 ?QRS Duration: 102 ?QT Interval:  423 ?QTC  Calculation: 524 ?R Axis:   95 ?Text Interpretation: Sinus rhythm Biatrial enlargement Borderline right axis deviation Nonspecific repol abnormality, diffuse leads Prolonged QT interval QTc prolongation noted Confirmed by Varney Biles [phone]) on 02/02/2022 11:56:25 AM ? ?Radiology ?CT HEAD WO CONTRAST ? ?Result Date: 02/02/2022 ?CLINICAL DATA:  Poly trauma.  Head trauma.  Abnormal mental status. EXAM: CT HEAD WITHOUT CONTRAST CT MAXILLOFACIAL WITHOUT CONTRAST CT CERVICAL SPINE WITHOUT CONTRAST TECHNIQUE: Multidetector CT imaging of the head, cervical spine, and maxillofacial structures were performed using the standard protocol without intravenous contrast. Multiplanar CT image reconstructions of the cervical spine and maxillofacial structures were also generated. RADIATION DOSE REDUCTION: This exam was performed according to the departmental dose-optimization program which includes automated exposure control, adjustment of the mA and/or kV according to patient size and/or use of iterative reconstruction technique. COMPARISON:  Head CT 11/18/2021 FINDINGS: CT HEAD FINDINGS Brain: There is no evidence for acute hemorrhage, hydrocephalus, mass lesion, or abnormal extra-axial fluid collection. No definite CT evidence for acute infarction. Vascular: No hyperdense vessel or unexpected calcification. Skull: No evidence for fracture. No worrisome lytic or sclerotic lesion. Other: None. CT MAXILLOFACIAL FINDINGS Osseous: No fracture or mandibular dislocation. Old minimally displaced nasal bone fractures evident. No destructive process. Orbits: Negative. No traumatic or inflammatory finding. Sinuses: Chronic mucosal disease noted right maxillary sinus. Right mastoid effusion evident. Soft tissues: There is some probable contusions/edema in the right frontal scalp and orbital region. CT CERVICAL SPINE FINDINGS Alignment: Straightening of normal cervical lordosis evident. Skull base and vertebrae: No acute fracture. No primary  bone lesion or focal pathologic process. Soft tissues and spinal canal: No prevertebral fluid or swelling. No visible canal hematoma. Disc levels: Loss of disc height noted C4-5 and C5-6. Facets are well aligned bilaterally. Upper chest: See dedicated CT chest performed at the same time and reported separately. Other: None. IMPRESSION: 1. No acute intracranial abnormality. 2. Old minimally displaced nasal bone fractures. 3. 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 4. No evidence for acute cervical spine fracture or subluxation. Findings discussed with Dr. Rosendo Gros at approximately 0957 hours on 02/02/2022. Electronically Signed   By: Misty Stanley M.D.   On: 02/02/2022 10:02  ? ?CT CERVICAL SPINE WO CONTRAST ? ?Result Date: 02/02/2022 ?CLINICAL DATA:  Poly trauma.  Head trauma.  Abnormal mental status. EXAM: CT HEAD WITHOUT CONTRAST  CT MAXILLOFACIAL WITHOUT CONTRAST CT CERVICAL SPINE WITHOUT CONTRAST TECHNIQUE: Multidetector CT imaging of the head, cervical spine, and maxillofacial structures were performed using the standard protocol without intravenous contrast. Multiplanar CT image reconstructions of the cervical spine and maxillofacial structures were also generated. RADIATION DOSE REDUCTION: This exam was performed according to the departmental dose-optimization program which includes automated exposure control, adjustment of the mA and/or kV according to patient size and/or use of iterative reconstruction technique. COMPARISON:  Head CT 11/18/2021 FINDINGS: CT HEAD FINDINGS Brain: There is no evidence for acute hemorrhage, hydrocephalus, mass lesion, or abnormal extra-axial fluid collection. No definite CT evidence for acute infarction. Vascular: No hyperdense vessel or unexpected calcification. Skull: No evidence for fracture. No worrisome lytic or sclerotic lesion. Other: None. CT MAXILLOFACIAL FINDINGS Osseous: No fracture or mandibular dislocation. Old minimally displaced nasal bone  fractures evident. No destructive process. Orbits: Negative. No traumatic or inflammatory finding. Sinuses: Chronic mucosal disease noted right maxillary sinus. Right mastoid effusion evident. Soft tissues: There is some

## 2022-02-03 ENCOUNTER — Inpatient Hospital Stay (HOSPITAL_COMMUNITY): Payer: Medicaid Other

## 2022-02-03 DIAGNOSIS — L8990 Pressure ulcer of unspecified site, unspecified stage: Secondary | ICD-10-CM | POA: Insufficient documentation

## 2022-02-03 DIAGNOSIS — G9340 Encephalopathy, unspecified: Secondary | ICD-10-CM | POA: Diagnosis not present

## 2022-02-03 LAB — GLUCOSE, CAPILLARY
Glucose-Capillary: 66 mg/dL — ABNORMAL LOW (ref 70–99)
Glucose-Capillary: 134 mg/dL — ABNORMAL HIGH (ref 70–99)
Glucose-Capillary: 114 mg/dL — ABNORMAL HIGH (ref 70–99)
Glucose-Capillary: 88 mg/dL (ref 70–99)
Glucose-Capillary: 110 mg/dL — ABNORMAL HIGH (ref 70–99)

## 2022-02-03 LAB — BASIC METABOLIC PANEL
Anion gap: 9 (ref 5–15)
BUN: 22 mg/dL — ABNORMAL HIGH (ref 6–20)
CO2: 16 mmol/L — ABNORMAL LOW (ref 22–32)
Calcium: 8.4 mg/dL — ABNORMAL LOW (ref 8.9–10.3)
Chloride: 114 mmol/L — ABNORMAL HIGH (ref 98–111)
Creatinine, Ser: 1.46 mg/dL — ABNORMAL HIGH (ref 0.44–1.00)
GFR, Estimated: 43 mL/min — ABNORMAL LOW (ref >60)
Glucose, Bld: 96 mg/dL (ref 70–99)
Potassium: 4.3 mmol/L (ref 3.5–5.1)
Sodium: 139 mmol/L (ref 135–145)

## 2022-02-03 LAB — CBC
HCT: 31.4 % — ABNORMAL LOW (ref 36.0–46.0)
Hemoglobin: 9.7 g/dL — ABNORMAL LOW (ref 12.0–15.0)
MCH: 26.8 pg (ref 26.0–34.0)
MCHC: 30.9 g/dL (ref 30.0–36.0)
MCV: 86.7 fL (ref 80.0–100.0)
Platelets: 164 10*3/uL (ref 150–400)
RBC: 3.62 MIL/uL — ABNORMAL LOW (ref 3.87–5.11)
RDW: 17.6 % — ABNORMAL HIGH (ref 11.5–15.5)
WBC: 7.4 10*3/uL (ref 4.0–10.5)
nRBC: 0.4 % — ABNORMAL HIGH (ref 0.0–0.2)

## 2022-02-03 LAB — MAGNESIUM
Magnesium: 1.6 mg/dL — ABNORMAL LOW (ref 1.7–2.4)
Magnesium: 1.8 mg/dL (ref 1.7–2.4)
Magnesium: 1.6 mg/dL — ABNORMAL LOW (ref 1.7–2.4)

## 2022-02-03 LAB — PHOSPHORUS
Phosphorus: 2.8 mg/dL (ref 2.5–4.6)
Phosphorus: 2.7 mg/dL (ref 2.5–4.6)

## 2022-02-03 LAB — TRIGLYCERIDES: Triglycerides: 274 mg/dL — ABNORMAL HIGH (ref <150)

## 2022-02-03 MED ORDER — FENTANYL BOLUS VIA INFUSION
50.0000 ug | INTRAVENOUS | Status: DC | PRN
  Administered 2022-02-03 (×2): 100 ug via INTRAVENOUS
  Administered 2022-02-04: 50 ug via INTRAVENOUS
  Filled 2022-02-03: qty 100

## 2022-02-03 MED ORDER — VANCOMYCIN HCL IN DEXTROSE 1-5 GM/200ML-% IV SOLN
1000.0000 mg | INTRAVENOUS | Status: DC
  Administered 2022-02-04: 1000 mg via INTRAVENOUS
  Filled 2022-02-03 (×2): qty 200

## 2022-02-03 MED ORDER — HEPARIN SODIUM (PORCINE) 5000 UNIT/ML IJ SOLN
5000.0000 [IU] | Freq: Three times a day (TID) | INTRAMUSCULAR | Status: DC
  Administered 2022-02-03 – 2022-02-13 (×29): 5000 [IU] via SUBCUTANEOUS
  Filled 2022-02-03 (×29): qty 1

## 2022-02-03 MED ORDER — PROSOURCE TF PO LIQD
45.0000 mL | Freq: Every day | ORAL | Status: DC
  Administered 2022-02-04 – 2022-02-08 (×5): 45 mL
  Filled 2022-02-03 (×5): qty 45

## 2022-02-03 MED ORDER — MIDAZOLAM BOLUS VIA INFUSION
0.0000 mg | INTRAVENOUS | Status: DC | PRN
  Filled 2022-02-03: qty 5

## 2022-02-03 MED ORDER — ARFORMOTEROL TARTRATE 15 MCG/2ML IN NEBU
15.0000 ug | INHALATION_SOLUTION | Freq: Two times a day (BID) | RESPIRATORY_TRACT | Status: DC
  Administered 2022-02-03 – 2022-02-13 (×21): 15 ug via RESPIRATORY_TRACT
  Filled 2022-02-03 (×21): qty 2

## 2022-02-03 MED ORDER — ADULT MULTIVITAMIN W/MINERALS CH
1.0000 | ORAL_TABLET | Freq: Every day | ORAL | Status: DC
Start: 2022-02-04 — End: 2022-02-09
  Administered 2022-02-04 – 2022-02-08 (×5): 1
  Filled 2022-02-03 (×6): qty 1

## 2022-02-03 MED ORDER — OSMOLITE 1.5 CAL PO LIQD
1000.0000 mL | ORAL | Status: DC
  Administered 2022-02-03 – 2022-02-06 (×4): 1000 mL
  Filled 2022-02-03: qty 1000

## 2022-02-03 MED ORDER — BUDESONIDE 0.25 MG/2ML IN SUSP
0.2500 mg | Freq: Two times a day (BID) | RESPIRATORY_TRACT | Status: DC
  Administered 2022-02-03 – 2022-02-13 (×21): 0.25 mg via RESPIRATORY_TRACT
  Filled 2022-02-03 (×21): qty 2

## 2022-02-03 MED ORDER — PANTOPRAZOLE 2 MG/ML SUSPENSION
40.0000 mg | Freq: Every day | ORAL | Status: DC
Start: 2022-02-03 — End: 2022-02-09
  Administered 2022-02-03 – 2022-02-08 (×6): 40 mg
  Filled 2022-02-03 (×6): qty 20

## 2022-02-03 MED ORDER — ADULT MULTIVITAMIN LIQUID CH
15.0000 mL | Freq: Every day | ORAL | Status: DC
  Administered 2022-02-03: 15 mL
  Filled 2022-02-03: qty 15

## 2022-02-03 MED ORDER — THIAMINE HCL 100 MG PO TABS
100.0000 mg | ORAL_TABLET | Freq: Every day | ORAL | Status: DC
  Administered 2022-02-04 – 2022-02-08 (×5): 100 mg
  Filled 2022-02-03 (×5): qty 1

## 2022-02-03 MED ORDER — REVEFENACIN 175 MCG/3ML IN SOLN
175.0000 ug | Freq: Every day | RESPIRATORY_TRACT | Status: DC
  Administered 2022-02-03 – 2022-02-13 (×11): 175 ug via RESPIRATORY_TRACT
  Filled 2022-02-03 (×11): qty 3

## 2022-02-03 MED ORDER — FOLIC ACID 1 MG PO TABS
1.0000 mg | ORAL_TABLET | Freq: Every day | ORAL | Status: DC
  Administered 2022-02-04 – 2022-02-08 (×5): 1 mg
  Filled 2022-02-03 (×5): qty 1

## 2022-02-03 MED ORDER — VITAL HIGH PROTEIN PO LIQD: 1000.0000 mL | ORAL | Status: DC

## 2022-02-03 MED ORDER — JUVEN PO PACK
1.0000 | PACK | Freq: Two times a day (BID) | ORAL | Status: DC
Start: 2022-02-03 — End: 2022-02-09
  Administered 2022-02-03 – 2022-02-08 (×11): 1
  Filled 2022-02-03 (×11): qty 1

## 2022-02-03 MED ORDER — PROSOURCE TF PO LIQD
45.0000 mL | Freq: Two times a day (BID) | ORAL | Status: DC
  Administered 2022-02-03: 45 mL
  Filled 2022-02-03: qty 45

## 2022-02-03 MED ORDER — DEXTROSE 50 % IV SOLN
25.0000 mL | Freq: Once | INTRAVENOUS | Status: AC
  Administered 2022-02-03: 25 mL via INTRAVENOUS
  Filled 2022-02-03: qty 50
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
## 2022-02-03 NOTE — Consult Note (Addendum)
WOC Nurse Consult Note: ?Reason for Consult: Pt has chronic wounds to sacrum and rectum.  Surgical team assessed the wounds on 4/2. She has a chronic post-op surgical wound to rectum from cancer which was removed.  Red and moist, 10X2X2cm, mod amt yellow drainage. 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 2X1X.3cm, red and moist, mod amt yellow drainage. ?Pressure Injury POA: Yes ?Dressing procedure/placement/frequency: Topical treatment orders provided for bedside nurses to perform as follows: Pack sacrum wound and rectal wound with alginate Kellie Simmering # [phone]) Q day, then cover with foam dressing.  (Change foam dressing Q 3 days or PRN soiling.) ? ?[place] Nurse ostomy consult note ?Stoma type/location: Stoma has been in place for awhile prior to admission.  ?Stomal assessment/size: Stoma is red and viable, 3/4 inches, above skin level, peristomal hernia is visible.  Current pouch was leaking behind the barrier.  ?Peristomal assessment: red raised lesions to edge of stoma with slight bleeding.  ?Output: 50cc liquid brown stool in the pouch ?Ostomy pouching: 1pc.  ?Education provided:  Pt is sedated and agitated; unable to discuss ostomy care with her.  She has been performing pouch changes at home prior to admission.  ?Use Supplies: Applied barrier ring, Lawson # [id number], and one piece pouch Lawson # [id number]. Extra supplies ordered to the bedside for staff nurses use.  ?Please re-consult if further assistance is needed.  Thank-you,  ?Julien Girt MSN, RN, [place], [place], CNS ?[phone]  ? ?  ?
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
## 2022-02-03 NOTE — Progress Notes (Signed)
Pharmacy Antibiotic Note ? ?Sherry Baker is a 56 y.o. female admitted on 02/02/2022 presenting unresponsive.  Pharmacy has been consulted for vancomycin and cefepime dosing for possible aspiration pneumonia. ? ?Plan: ?Change Vancomycin to 1000 mg IV q48h (eAUC 448, SCr 1.46) ?Continue Cefepime 2g IV q 24h ?Monitor renal function, Cx and clinical progression to narrow ?Vancomycin levels as needed ? ?Height: '5\' 1"'$  (154.9 cm) ?Weight: 48.4 kg (106 lb 11.2 oz) ?IBW/kg (Calculated) : 47.8 ? ?Temp (24hrs), Avg:100 ?F (37.8 ?C), Min:98.6 ?F (37 ?C), Max:101.7 ?F (38.7 ?C) ? ?Recent Labs  ?Lab 02/02/22 ?3559 02/02/22 ?0911 02/02/22 ?1214 02/02/22 ?1215 02/02/22 ?1216 02/03/22 ?0459  ?WBC 4.7  --   --  5.2  --  7.4  ?CREATININE  --  1.40*  --   --  1.28* 1.46*  ?LATICACIDVEN  --   --  2.2*  --   --   --   ? ?  ?Estimated Creatinine Clearance: 33.2 mL/min (A) (by C-G formula based on SCr of 1.46 mg/dL (H)).   ? ?Not on File ? ?Luisa Hart, PharmD, BCPS ?Clinical Pharmacist ?02/03/2022 1:39 PM  ? ?Please refer to [facility] for pharmacy phone number  ?

## 2022-02-03 NOTE — Progress Notes (Addendum)
?  Transition of Care (TOC) Screening Note ? ? ?Patient Details  ?Name: MILKA WINDHOLZ ?Date of Birth: 29-Mar-1966 ? ? ?Transition of Care (TOC) CM/SW Contact:    ?Benard Halsted, LCSW ?Phone Number: ?02/03/2022, 9:43 AM ? ? ? ?Transition of Care Department [facility]) has reviewed patient and she is well known to [facility] team. We will continue to monitor patient advancement through interdisciplinary progression rounds.  ? ? ?

## 2022-02-03 NOTE — Progress Notes (Signed)
RT NOTE: patient placed on CPAP/PSV of 12/5 at 0725.  Patient is currently tolerating well.  Will continue to monitor and wean as tolerated. ?

## 2022-02-04 ENCOUNTER — Inpatient Hospital Stay (HOSPITAL_COMMUNITY): Payer: Medicaid Other

## 2022-02-04 DIAGNOSIS — J9601 Acute respiratory failure with hypoxia: Secondary | ICD-10-CM

## 2022-02-04 DIAGNOSIS — E43 Unspecified severe protein-calorie malnutrition: Secondary | ICD-10-CM | POA: Insufficient documentation

## 2022-02-04 DIAGNOSIS — G9340 Encephalopathy, unspecified: Secondary | ICD-10-CM | POA: Diagnosis not present

## 2022-02-04 LAB — GLUCOSE, CAPILLARY
Glucose-Capillary: 122 mg/dL — ABNORMAL HIGH (ref 70–99)
Glucose-Capillary: 131 mg/dL — ABNORMAL HIGH (ref 70–99)
Glucose-Capillary: 138 mg/dL — ABNORMAL HIGH (ref 70–99)
Glucose-Capillary: 141 mg/dL — ABNORMAL HIGH (ref 70–99)
Glucose-Capillary: 155 mg/dL — ABNORMAL HIGH (ref 70–99)
Glucose-Capillary: 60 mg/dL — ABNORMAL LOW (ref 70–99)
Glucose-Capillary: 66 mg/dL — ABNORMAL LOW (ref 70–99)
Glucose-Capillary: 91 mg/dL (ref 70–99)

## 2022-02-04 LAB — COMPREHENSIVE METABOLIC PANEL
ALT: 10 U/L (ref 0–44)
AST: 25 U/L (ref 15–41)
Albumin: 2.1 g/dL — ABNORMAL LOW (ref 3.5–5.0)
Alkaline Phosphatase: 74 U/L (ref 38–126)
Anion gap: 7 (ref 5–15)
BUN: 31 mg/dL — ABNORMAL HIGH (ref 6–20)
CO2: 18 mmol/L — ABNORMAL LOW (ref 22–32)
Calcium: 8.8 mg/dL — ABNORMAL LOW (ref 8.9–10.3)
Chloride: 116 mmol/L — ABNORMAL HIGH (ref 98–111)
Creatinine, Ser: 1.37 mg/dL — ABNORMAL HIGH (ref 0.44–1.00)
GFR, Estimated: 46 mL/min — ABNORMAL LOW (ref >60)
Glucose, Bld: 97 mg/dL (ref 70–99)
Potassium: 4.8 mmol/L (ref 3.5–5.1)
Sodium: 141 mmol/L (ref 135–145)
Total Bilirubin: 0.6 mg/dL (ref 0.3–1.2)
Total Protein: 6.2 g/dL — ABNORMAL LOW (ref 6.5–8.1)

## 2022-02-04 LAB — CBC
HCT: 32.4 % — ABNORMAL LOW (ref 36.0–46.0)
Hemoglobin: 9.6 g/dL — ABNORMAL LOW (ref 12.0–15.0)
MCH: 26.4 pg (ref 26.0–34.0)
MCHC: 29.6 g/dL — ABNORMAL LOW (ref 30.0–36.0)
MCV: 89.3 fL (ref 80.0–100.0)
Platelets: 182 10*3/uL (ref 150–400)
RBC: 3.63 MIL/uL — ABNORMAL LOW (ref 3.87–5.11)
RDW: 17.9 % — ABNORMAL HIGH (ref 11.5–15.5)
WBC: 12.6 10*3/uL — ABNORMAL HIGH (ref 4.0–10.5)
nRBC: 0 % (ref 0.0–0.2)

## 2022-02-04 LAB — MAGNESIUM: Magnesium: 1.7 mg/dL (ref 1.7–2.4)

## 2022-02-04 LAB — PHOSPHORUS: Phosphorus: 2.9 mg/dL (ref 2.5–4.6)

## 2022-02-04 MED ORDER — SODIUM CHLORIDE 0.9 % IV SOLN
2.0000 g | Freq: Two times a day (BID) | INTRAVENOUS | Status: DC
  Administered 2022-02-04 – 2022-02-07 (×7): 2 g via INTRAVENOUS
  Filled 2022-02-04 (×7): qty 12.5

## 2022-02-04 MED ORDER — MAGNESIUM SULFATE 2 GM/50ML IV SOLN
2.0000 g | Freq: Once | INTRAVENOUS | Status: AC
  Administered 2022-02-04: 2 g via INTRAVENOUS
  Filled 2022-02-04: qty 50

## 2022-02-04 MED ORDER — DOCUSATE SODIUM 50 MG/5ML PO LIQD
100.0000 mg | Freq: Two times a day (BID) | ORAL | Status: DC
  Administered 2022-02-04 – 2022-02-07 (×6): 100 mg
  Filled 2022-02-04 (×8): qty 10

## 2022-02-04 MED ORDER — POLYETHYLENE GLYCOL 3350 17 G PO PACK
17.0000 g | PACK | Freq: Every day | ORAL | Status: DC
  Administered 2022-02-05 – 2022-02-06 (×2): 17 g
  Filled 2022-02-04 (×2): qty 1

## 2022-02-04 MED ORDER — DEXTROSE 50 % IV SOLN
INTRAVENOUS | Status: AC
  Filled 2022-02-04: qty 50

## 2022-02-04 MED ORDER — FENTANYL BOLUS VIA INFUSION
50.0000 ug | INTRAVENOUS | Status: DC | PRN
  Administered 2022-02-04 (×2): 100 ug via INTRAVENOUS
  Administered 2022-02-04: 50 ug via INTRAVENOUS
  Administered 2022-02-04 – 2022-02-08 (×24): 100 ug via INTRAVENOUS
  Filled 2022-02-04: qty 100

## 2022-02-04 MED ORDER — FENTANYL 2500MCG IN NS 250ML (10MCG/ML) PREMIX INFUSION
50.0000 ug/h | INTRAVENOUS | Status: DC
  Administered 2022-02-04: 50 ug/h via INTRAVENOUS
  Administered 2022-02-05: 125 ug/h via INTRAVENOUS
  Administered 2022-02-05 – 2022-02-08 (×7): 200 ug/h via INTRAVENOUS
  Filled 2022-02-04 (×9): qty 250

## 2022-02-04 MED ORDER — MIDAZOLAM HCL 2 MG/2ML IJ SOLN
2.0000 mg | INTRAMUSCULAR | Status: DC | PRN
  Administered 2022-02-04 – 2022-02-08 (×22): 2 mg via INTRAVENOUS
  Filled 2022-02-04 (×24): qty 2

## 2022-02-04 MED ORDER — DEXTROSE 50 % IV SOLN
25.0000 mL | Freq: Once | INTRAVENOUS | Status: AC
  Administered 2022-02-04: 25 mL via INTRAVENOUS
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
## 2022-02-04 NOTE — Progress Notes (Signed)
[facility] ADULT ICU REPLACEMENT PROTOCOL ? ? ?The patient does apply for the [facility] Adult ICU Electrolyte Replacment Protocol based on the criteria listed below:  ? ?1.Exclusion criteria: TCTS patients, ECMO patients, and Dialysis patients ?2. Is GFR >/= 30 ml/min? Yes.    ?Patient's GFR today is 46 ?3. Is SCr </= 2? Yes.   ?Patient's SCr is 1.37 mg/dL ?4. Did SCr increase >/= 0.5 in 24 hours? No. ?5.Pt's weight >40kg  Yes.   ?6. Abnormal electrolyte(s): Mg 1.7  ?7. Electrolytes replaced per protocol ?8.  Call MD STAT for K+ </= 2.5, Phos </= 1, or Mag </= 1 ?Physician:  Oletta Darter ? ?Margaret Pyle 02/04/2022 4:44 AM  ?

## 2022-02-04 NOTE — Telephone Encounter (Signed)
Attempted again to contact  patient to discuss her financial concerns and provide appropriate resources  Message left with call back requested to this CM ?

## 2022-02-04 NOTE — Progress Notes (Signed)
25 mL versed wasted in stericycle with Delorise Jackson RN. ?
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
## 2022-02-04 NOTE — Progress Notes (Signed)
Pharmacy Antibiotic Note ? ?Sherry Baker is a 56 y.o. female admitted on 02/02/2022 presenting unresponsive.  Pharmacy has been consulted for vancomycin and cefepime dosing for possible aspiration pneumonia. ? ?Plan: ?Continue Vancomycin to 1000 mg IV q48h (eAUC 448, SCr 1.46) ?Increase Cefepime 2g IV q12h ?Monitor renal function, Cx and clinical progression to narrow ?Vancomycin levels as needed ? ?Height: '5\' 1"'$  (154.9 cm) ?Weight: 48.4 kg (106 lb 11.2 oz) ?IBW/kg (Calculated) : 47.8 ? ?Temp (24hrs), Avg:99.2 ?F (37.3 ?C), Min:98.3 ?F (36.8 ?C), Max:99.7 ?F (37.6 ?C) ? ?Recent Labs  ?Lab 02/02/22 ?0814 02/02/22 ?0911 02/02/22 ?1214 02/02/22 ?1215 02/02/22 ?1216 02/03/22 ?0459 02/04/22 ?0358  ?WBC 4.7  --   --  5.2  --  7.4 12.6*  ?CREATININE  --  1.40*  --   --  1.28* 1.46* 1.37*  ?LATICACIDVEN  --   --  2.2*  --   --   --   --   ? ?  ?Estimated Creatinine Clearance: 35.4 mL/min (A) (by C-G formula based on SCr of 1.37 mg/dL (H)).   ? ?Luisa Hart, PharmD, BCPS ?Clinical Pharmacist ?02/04/2022 8:01 AM  ? ?Please refer to [facility] for pharmacy phone number  ?

## 2022-02-04 NOTE — Progress Notes (Signed)
RT NOTE: attempted SBT on patient this AM on CPAP/PSV of 12/5 however patient went apneic and backup ventilation alarmed.  Patient was placed back on full support ventilator settings.  MD present in patient room and ventilator changes were made for patient comfort (orders also changed to correlate).  RT will continue to monitor and attempt to wean as tolerated.  ?

## 2022-02-05 ENCOUNTER — Inpatient Hospital Stay (HOSPITAL_COMMUNITY): Payer: Medicaid Other

## 2022-02-05 DIAGNOSIS — G9340 Encephalopathy, unspecified: Secondary | ICD-10-CM | POA: Diagnosis not present

## 2022-02-05 LAB — PHOSPHORUS: Phosphorus: 2.2 mg/dL — ABNORMAL LOW (ref 2.5–4.6)

## 2022-02-05 LAB — BASIC METABOLIC PANEL
Anion gap: 6 (ref 5–15)
BUN: 43 mg/dL — ABNORMAL HIGH (ref 6–20)
CO2: 22 mmol/L (ref 22–32)
Calcium: 8.9 mg/dL (ref 8.9–10.3)
Chloride: 113 mmol/L — ABNORMAL HIGH (ref 98–111)
Creatinine, Ser: 1.4 mg/dL — ABNORMAL HIGH (ref 0.44–1.00)
GFR, Estimated: 45 mL/min — ABNORMAL LOW (ref >60)
Glucose, Bld: 97 mg/dL (ref 70–99)
Potassium: 3.7 mmol/L (ref 3.5–5.1)
Sodium: 141 mmol/L (ref 135–145)

## 2022-02-05 LAB — GLUCOSE, CAPILLARY
Glucose-Capillary: 106 mg/dL — ABNORMAL HIGH (ref 70–99)
Glucose-Capillary: 107 mg/dL — ABNORMAL HIGH (ref 70–99)
Glucose-Capillary: 111 mg/dL — ABNORMAL HIGH (ref 70–99)
Glucose-Capillary: 105 mg/dL — ABNORMAL HIGH (ref 70–99)
Glucose-Capillary: 113 mg/dL — ABNORMAL HIGH (ref 70–99)
Glucose-Capillary: 120 mg/dL — ABNORMAL HIGH (ref 70–99)

## 2022-02-05 LAB — MAGNESIUM: Magnesium: 2 mg/dL (ref 1.7–2.4)

## 2022-02-05 NOTE — Procedures (Signed)
Cortrak ? ?Person Inserting Tube:  Sherry Baker, [name], RD ?Tube Type:  Cortrak - 43 inches ?Tube Size:  10 ?Tube Location:  Left nare ?Secured by: [name] ?Technique Used to Measure Tube Placement:  Marking at nare/corner of mouth ?Cortrak Secured At:  65 cm ? ?Cortrak Tube Team Note: ? ?Consult received to place a Cortrak feeding tube.  ? ?X-ray is required, abdominal x-ray has been ordered by the Cortrak team. Please confirm tube placement before using the Cortrak tube.  ? ?If the tube becomes dislodged please keep the tube and contact the Cortrak team at [email or web address] (password [email or web address]) for replacement.  ?If after hours and replacement cannot be delayed, place a NG tube and confirm placement with an abdominal x-ray.  ? ? ? ?[name]., MS, RD, LDN (she/her/hers) ?RD pager number and weekend/on-call pager number located in [place]. ? ? ?
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
## 2022-02-05 NOTE — Progress Notes (Signed)
Spoke with MD about reinsertion of foley overnight due to the patient being in and out catheterized 3 times. Spoke about retention medications. No new orders at this time.  ? ?Normajean Baxter, RN ?

## 2022-02-05 NOTE — Progress Notes (Signed)
eLink Physician-Brief Progress Note ?Patient Name: Sherry Baker ?DOB: [birth year] ?MRN: [id number] ? ? ?Date of Service ? 02/05/2022  ?HPI/Events of Note ? Urinary retention - Foley catheter removed 02/03/2022. Patient has required I/O cath X 3 in last 24 hours. Now bladder scan with 507 mL residual.   ?eICU Interventions ? Plan: ?Place Foley catheter.   ? ? ? ?Intervention Category ?Major Interventions: Other: ? ?[name] ?02/05/2022, 5:33 AM ?

## 2022-02-06 ENCOUNTER — Telehealth: Payer: Self-pay

## 2022-02-06 DIAGNOSIS — J9601 Acute respiratory failure with hypoxia: Secondary | ICD-10-CM | POA: Diagnosis not present

## 2022-02-06 DIAGNOSIS — G9340 Encephalopathy, unspecified: Secondary | ICD-10-CM | POA: Diagnosis not present

## 2022-02-06 LAB — HEMOGLOBIN A1C
Hgb A1c MFr Bld: 5 % (ref 4.8–5.6)
Mean Plasma Glucose: 96.8 mg/dL

## 2022-02-06 LAB — BASIC METABOLIC PANEL
Anion gap: 7 (ref 5–15)
BUN: 47 mg/dL — ABNORMAL HIGH (ref 6–20)
CO2: 25 mmol/L (ref 22–32)
Calcium: 8.7 mg/dL — ABNORMAL LOW (ref 8.9–10.3)
Chloride: 111 mmol/L (ref 98–111)
Creatinine, Ser: 1.47 mg/dL — ABNORMAL HIGH (ref 0.44–1.00)
GFR, Estimated: 42 mL/min — ABNORMAL LOW (ref >60)
Glucose, Bld: 141 mg/dL — ABNORMAL HIGH (ref 70–99)
Potassium: 3.6 mmol/L (ref 3.5–5.1)
Sodium: 143 mmol/L (ref 135–145)

## 2022-02-06 LAB — GLUCOSE, CAPILLARY
Glucose-Capillary: 125 mg/dL — ABNORMAL HIGH (ref 70–99)
Glucose-Capillary: 133 mg/dL — ABNORMAL HIGH (ref 70–99)
Glucose-Capillary: 135 mg/dL — ABNORMAL HIGH (ref 70–99)
Glucose-Capillary: 145 mg/dL — ABNORMAL HIGH (ref 70–99)
Glucose-Capillary: 184 mg/dL — ABNORMAL HIGH (ref 70–99)
Glucose-Capillary: 86 mg/dL (ref 70–99)

## 2022-02-06 LAB — CBC
HCT: 25.3 % — ABNORMAL LOW (ref 36.0–46.0)
Hemoglobin: 7.9 g/dL — ABNORMAL LOW (ref 12.0–15.0)
MCH: 27.1 pg (ref 26.0–34.0)
MCHC: 31.2 g/dL (ref 30.0–36.0)
MCV: 86.9 fL (ref 80.0–100.0)
Platelets: 168 10*3/uL (ref 150–400)
RBC: 2.91 MIL/uL — ABNORMAL LOW (ref 3.87–5.11)
RDW: 17.3 % — ABNORMAL HIGH (ref 11.5–15.5)
WBC: 6.8 10*3/uL (ref 4.0–10.5)
nRBC: 0 % (ref 0.0–0.2)

## 2022-02-06 MED ORDER — POTASSIUM CHLORIDE 20 MEQ PO PACK
40.0000 meq | PACK | Freq: Once | ORAL | Status: AC
  Administered 2022-02-06: 40 meq
  Filled 2022-02-06: qty 2

## 2022-02-06 MED ORDER — DEXMEDETOMIDINE HCL IN NACL 400 MCG/100ML IV SOLN
0.0000 ug/kg/h | INTRAVENOUS | Status: DC
  Administered 2022-02-06: 0.8 ug/kg/h via INTRAVENOUS
  Administered 2022-02-06: 0.2 ug/kg/h via INTRAVENOUS
  Administered 2022-02-07: 1.1 ug/kg/h via INTRAVENOUS
  Administered 2022-02-07: 1 ug/kg/h via INTRAVENOUS
  Administered 2022-02-07 – 2022-02-08 (×2): 1.2 ug/kg/h via INTRAVENOUS
  Administered 2022-02-08 (×2): 1 ug/kg/h via INTRAVENOUS
  Administered 2022-02-08: 1.2 ug/kg/h via INTRAVENOUS
  Administered 2022-02-09: 1 ug/kg/h via INTRAVENOUS
  Filled 2022-02-06 (×9): qty 100

## 2022-02-06 MED ORDER — FUROSEMIDE 10 MG/ML IJ SOLN
40.0000 mg | Freq: Once | INTRAMUSCULAR | Status: AC
Start: 2022-02-06 — End: 2022-02-06
  Administered 2022-02-06: 40 mg via INTRAVENOUS
  Filled 2022-02-06: qty 4

## 2022-02-06 MED ORDER — INSULIN ASPART 100 UNIT/ML IJ SOLN
0.0000 [IU] | INTRAMUSCULAR | Status: DC
  Administered 2022-02-06: 3 [IU] via SUBCUTANEOUS
  Administered 2022-02-07: 2 [IU] via SUBCUTANEOUS
  Administered 2022-02-07: 3 [IU] via SUBCUTANEOUS
  Administered 2022-02-07 – 2022-02-08 (×2): 2 [IU] via SUBCUTANEOUS
  Administered 2022-02-11: 3 [IU] via SUBCUTANEOUS

## 2022-02-06 NOTE — Progress Notes (Signed)
eLink Physician-Brief Progress Note ?Patient Name: Sherry Baker ?DOB: July 07, 1966 ?MRN: [id number] ? ? ?Date of Service ? 02/06/2022  ?HPI/Events of Note ? Lab not able to draw blood from arms/hands on 3 attempts.   ?eICU Interventions ? Plan: ?Lab may stick feet for lab studies.   ? ? ? ?Intervention Category ?Major Interventions: Other: ? ?Geneive Sandstrom [name] ?02/06/2022, 2:41 AM ?

## 2022-02-06 NOTE — Progress Notes (Signed)
Pt had small tube feed emesis.  RN stopped tube feeds, suctioned pt, and notified MD.  RN gave PRN Zofran.  No new orders at this time.  Will notify MD if emesis continues.  ?

## 2022-02-06 NOTE — Telephone Encounter (Signed)
Copied from [place] [id number]. Topic: General - Other >> Feb 06, 2022  1:17 PM Tessa Lerner A wrote: Reason for CRM: [name] with [facility] would like to speak with a member of clinical staff about an update on the previously requested order for the patent's ostomy supplies   As of today 02/06/22 the patient has 4 days worth of supplies remaining   Please contact further when possible
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
## 2022-02-06 NOTE — Progress Notes (Signed)
eLink Physician-Brief Progress Note ?Patient Name: Sherry Baker ?DOB: [birth year] ?MRN: [id number] ? ? ?Date of Service ? 02/06/2022  ?HPI/Events of Note ? Request for SSI  ?eICU Interventions ? Ordered SSI  ? ? ? ?Intervention Category ?Minor Interventions: Routine modifications to care plan (e.g. PRN medications for pain, fever) ? ?Sherry Baker ?02/06/2022, 8:17 PM ?

## 2022-02-06 NOTE — Telephone Encounter (Signed)
Call returned to Sherry Baker/ [place] [address], message left with call back requested to this CM.   ? ?Need to determine who ordered the ostomy supplies. Patient has not established care at one of the [facility] yet.  ?

## 2022-02-07 ENCOUNTER — Inpatient Hospital Stay (HOSPITAL_COMMUNITY): Payer: Medicaid Other

## 2022-02-07 DIAGNOSIS — E43 Unspecified severe protein-calorie malnutrition: Secondary | ICD-10-CM

## 2022-02-07 DIAGNOSIS — J9601 Acute respiratory failure with hypoxia: Secondary | ICD-10-CM | POA: Diagnosis not present

## 2022-02-07 DIAGNOSIS — G9340 Encephalopathy, unspecified: Secondary | ICD-10-CM | POA: Diagnosis not present

## 2022-02-07 LAB — CBC
HCT: 28.5 % — ABNORMAL LOW (ref 36.0–46.0)
Hemoglobin: 8.9 g/dL — ABNORMAL LOW (ref 12.0–15.0)
MCH: 26.6 pg (ref 26.0–34.0)
MCHC: 31.2 g/dL (ref 30.0–36.0)
MCV: 85.1 fL (ref 80.0–100.0)
Platelets: 185 10*3/uL (ref 150–400)
RBC: 3.35 MIL/uL — ABNORMAL LOW (ref 3.87–5.11)
RDW: 17.2 % — ABNORMAL HIGH (ref 11.5–15.5)
WBC: 8.1 10*3/uL (ref 4.0–10.5)
nRBC: 0.2 % (ref 0.0–0.2)

## 2022-02-07 LAB — BASIC METABOLIC PANEL
Anion gap: 9 (ref 5–15)
BUN: 42 mg/dL — ABNORMAL HIGH (ref 6–20)
CO2: 25 mmol/L (ref 22–32)
Calcium: 9 mg/dL (ref 8.9–10.3)
Chloride: 110 mmol/L (ref 98–111)
Creatinine, Ser: 1.32 mg/dL — ABNORMAL HIGH (ref 0.44–1.00)
GFR, Estimated: 48 mL/min — ABNORMAL LOW (ref >60)
Glucose, Bld: 177 mg/dL — ABNORMAL HIGH (ref 70–99)
Potassium: 3.5 mmol/L (ref 3.5–5.1)
Sodium: 144 mmol/L (ref 135–145)

## 2022-02-07 LAB — IRON AND TIBC
Iron: 9 ug/dL — ABNORMAL LOW (ref 28–170)
Saturation Ratios: 3 % — ABNORMAL LOW (ref 10.4–31.8)
TIBC: 276 ug/dL (ref 250–450)
UIBC: 267 ug/dL

## 2022-02-07 LAB — MAGNESIUM: Magnesium: 1.8 mg/dL (ref 1.7–2.4)

## 2022-02-07 LAB — GLUCOSE, CAPILLARY
Glucose-Capillary: 197 mg/dL — ABNORMAL HIGH (ref 70–99)
Glucose-Capillary: 129 mg/dL — ABNORMAL HIGH (ref 70–99)
Glucose-Capillary: 119 mg/dL — ABNORMAL HIGH (ref 70–99)
Glucose-Capillary: 128 mg/dL — ABNORMAL HIGH (ref 70–99)
Glucose-Capillary: 98 mg/dL (ref 70–99)
Glucose-Capillary: 120 mg/dL — ABNORMAL HIGH (ref 70–99)

## 2022-02-07 LAB — RETICULOCYTES
Immature Retic Fract: 13.3 % (ref 2.3–15.9)
RBC.: 3.41 MIL/uL — ABNORMAL LOW (ref 3.87–5.11)
Retic Count, Absolute: 32.1 10*3/uL (ref 19.0–186.0)
Retic Ct Pct: 0.9 % (ref 0.4–3.1)

## 2022-02-07 LAB — PHOSPHORUS: Phosphorus: 2 mg/dL — ABNORMAL LOW (ref 2.5–4.6)

## 2022-02-07 LAB — FERRITIN: Ferritin: 108 ng/mL (ref 11–307)

## 2022-02-07 MED ORDER — OXYCODONE HCL 5 MG PO TABS
5.0000 mg | ORAL_TABLET | Freq: Four times a day (QID) | ORAL | Status: DC
Start: 2022-02-07 — End: 2022-02-09
  Administered 2022-02-07 – 2022-02-08 (×7): 5 mg
  Filled 2022-02-07 (×7): qty 1

## 2022-02-07 MED ORDER — POLYETHYLENE GLYCOL 3350 17 G PO PACK
17.0000 g | PACK | Freq: Two times a day (BID) | ORAL | Status: DC
  Administered 2022-02-07 (×2): 17 g
  Filled 2022-02-07 (×3): qty 1

## 2022-02-07 MED ORDER — POTASSIUM CHLORIDE 20 MEQ PO PACK
20.0000 meq | PACK | Freq: Once | ORAL | Status: AC
  Administered 2022-02-07: 20 meq
  Filled 2022-02-07: qty 1

## 2022-02-07 MED ORDER — CLONAZEPAM 0.25 MG PO TBDP
0.2500 mg | ORAL_TABLET | Freq: Two times a day (BID) | ORAL | Status: DC
  Administered 2022-02-07: 0.25 mg
  Filled 2022-02-07: qty 1

## 2022-02-07 MED ORDER — BETHANECHOL CHLORIDE 10 MG PO TABS
10.0000 mg | ORAL_TABLET | Freq: Three times a day (TID) | ORAL | Status: DC
  Administered 2022-02-07 – 2022-02-08 (×6): 10 mg
  Filled 2022-02-07 (×7): qty 1

## 2022-02-07 MED ORDER — LACTULOSE 10 GM/15ML PO SOLN
20.0000 g | Freq: Once | ORAL | Status: AC
  Administered 2022-02-07: 20 g
  Filled 2022-02-07: qty 30

## 2022-02-07 MED ORDER — CLONAZEPAM 0.25 MG PO TBDP
0.5000 mg | ORAL_TABLET | Freq: Two times a day (BID) | ORAL | Status: DC
  Administered 2022-02-07 – 2022-02-08 (×3): 0.5 mg
  Filled 2022-02-07 (×3): qty 2

## 2022-02-07 MED ORDER — MAGNESIUM SULFATE 2 GM/50ML IV SOLN
2.0000 g | Freq: Once | INTRAVENOUS | Status: AC
  Administered 2022-02-07: 2 g via INTRAVENOUS
  Filled 2022-02-07: qty 50

## 2022-02-07 MED ORDER — POTASSIUM PHOSPHATES 15 MMOLE/5ML IV SOLN
15.0000 mmol | Freq: Once | INTRAVENOUS | Status: AC
  Administered 2022-02-07: 15 mmol via INTRAVENOUS
  Filled 2022-02-07: qty 5

## 2022-02-07 NOTE — Progress Notes (Signed)
eLink Physician-Brief Progress Note ?Patient Name: Sherry Baker ?DOB: [birth year] ?MRN: [id number] ? ? ?Date of Service ? 02/07/2022  ?HPI/Events of Note ? RN reported ongoing tube feed emesis ?TF discontinued ~10 pm and given zofran ? ?Per RN, abdomen soft, nondistended at bedside  ?eICU Interventions ? Repeat PRN Zofran ?Continue to hold TF ?Day team to assess when they arrive  ? ? ? ?Intervention Category ?Minor Interventions: Other: ? ?[name] ?02/07/2022, 6:06 AM ?
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
## 2022-02-07 NOTE — Progress Notes (Signed)
[facility] ADULT ICU REPLACEMENT PROTOCOL ? ? ?The patient does apply for the [facility] Adult ICU Electrolyte Replacment Protocol based on the criteria listed below:  ? ?1.Exclusion criteria: TCTS patients, ECMO patients, and Dialysis patients ?2. Is GFR >/= 30 ml/min? Yes.    ?Patient's GFR today is 48 ?3. Is SCr </= 2? Yes.   ?Patient's SCr is 1.32 mg/dL ?4. Did SCr increase >/= 0.5 in 24 hours? No. ?5.Pt's weight >40kg  Yes.   ?6. Abnormal electrolyte(s): k+ 3.5, Phos 2.0, Mg 1.8  ?7. Electrolytes replaced per protocol ?8.  Call MD STAT for K+ </= 2.5, Phos </= 1, or Mag </= 1 ?Physician:  Loanne Drilling ? ?Margaret Pyle 02/07/2022 3:31 AM  ?
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
## 2022-02-08 DIAGNOSIS — G9340 Encephalopathy, unspecified: Secondary | ICD-10-CM | POA: Diagnosis not present

## 2022-02-08 LAB — GLUCOSE, CAPILLARY
Glucose-Capillary: 121 mg/dL — ABNORMAL HIGH (ref 70–99)
Glucose-Capillary: 94 mg/dL (ref 70–99)
Glucose-Capillary: 101 mg/dL — ABNORMAL HIGH (ref 70–99)
Glucose-Capillary: 108 mg/dL — ABNORMAL HIGH (ref 70–99)
Glucose-Capillary: 110 mg/dL — ABNORMAL HIGH (ref 70–99)

## 2022-02-08 MED ORDER — CLONIDINE HCL 0.2 MG PO TABS
0.3000 mg | ORAL_TABLET | Freq: Four times a day (QID) | ORAL | Status: DC
  Administered 2022-02-08 – 2022-02-09 (×3): 0.3 mg
  Filled 2022-02-08 (×3): qty 1

## 2022-02-08 MED ORDER — CHLORHEXIDINE GLUCONATE 0.12 % MT SOLN
15.0000 mL | Freq: Two times a day (BID) | OROMUCOSAL | Status: DC
  Administered 2022-02-09 – 2022-02-13 (×9): 15 mL via OROMUCOSAL
  Filled 2022-02-08 (×8): qty 15

## 2022-02-08 MED ORDER — ORAL CARE MOUTH RINSE
15.0000 mL | Freq: Two times a day (BID) | OROMUCOSAL | Status: DC
  Administered 2022-02-09 – 2022-02-11 (×6): 15 mL via OROMUCOSAL

## 2022-02-08 NOTE — Progress Notes (Signed)
Patient d/c from wrist restraints, precedex gtt titrated down, clonidine started. Patient then attempting to get out of bed, soft waist bed restraint order obtained.  ?

## 2022-02-08 NOTE — Procedures (Signed)
Extubation Procedure Note ? ?Patient Details:   ?Name: Sherry Baker ?DOB: 03-Nov-1966 ?MRN: [id number] ?  ?Airway Documentation:  ?  ?Vent end date: 02/08/22 Vent end time: 0923  ? ?Evaluation ? O2 sats: stable throughout ?Complications: No apparent complications ?Patient did tolerate procedure well. ?Bilateral Breath Sounds: Rhonchi, Diminished ?  ?Yes ? ?Patient with positive cuff leak. Order to extubate. Patient extubated to a 3 Lpm nasal cannula with no complications. No signs of dyspnea or stridor noted. Patient attempted Incentive Spirometer achieving 246m once. RN at bedside.  ? ?SMyrtie Neither?02/08/2022, 9:32 AM ? ?
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
## 2022-02-09 DIAGNOSIS — G9340 Encephalopathy, unspecified: Secondary | ICD-10-CM | POA: Diagnosis not present

## 2022-02-09 LAB — BASIC METABOLIC PANEL
Anion gap: 10 (ref 5–15)
BUN: 45 mg/dL — ABNORMAL HIGH (ref 6–20)
CO2: 22 mmol/L (ref 22–32)
Calcium: 9 mg/dL (ref 8.9–10.3)
Chloride: 111 mmol/L (ref 98–111)
Creatinine, Ser: 1.3 mg/dL — ABNORMAL HIGH (ref 0.44–1.00)
GFR, Estimated: 49 mL/min — ABNORMAL LOW (ref >60)
Glucose, Bld: 91 mg/dL (ref 70–99)
Potassium: 3.3 mmol/L — ABNORMAL LOW (ref 3.5–5.1)
Sodium: 143 mmol/L (ref 135–145)

## 2022-02-09 LAB — GLUCOSE, CAPILLARY
Glucose-Capillary: 100 mg/dL — ABNORMAL HIGH (ref 70–99)
Glucose-Capillary: 100 mg/dL — ABNORMAL HIGH (ref 70–99)
Glucose-Capillary: 102 mg/dL — ABNORMAL HIGH (ref 70–99)
Glucose-Capillary: 105 mg/dL — ABNORMAL HIGH (ref 70–99)
Glucose-Capillary: 86 mg/dL (ref 70–99)
Glucose-Capillary: 87 mg/dL (ref 70–99)
Glucose-Capillary: 99 mg/dL (ref 70–99)

## 2022-02-09 LAB — CBC
HCT: 28 % — ABNORMAL LOW (ref 36.0–46.0)
Hemoglobin: 8.5 g/dL — ABNORMAL LOW (ref 12.0–15.0)
MCH: 26.2 pg (ref 26.0–34.0)
MCHC: 30.4 g/dL (ref 30.0–36.0)
MCV: 86.2 fL (ref 80.0–100.0)
Platelets: 254 10*3/uL (ref 150–400)
RBC: 3.25 MIL/uL — ABNORMAL LOW (ref 3.87–5.11)
RDW: 17.2 % — ABNORMAL HIGH (ref 11.5–15.5)
WBC: 7.1 10*3/uL (ref 4.0–10.5)
nRBC: 0.3 % — ABNORMAL HIGH (ref 0.0–0.2)

## 2022-02-09 MED ORDER — THIAMINE HCL 100 MG PO TABS
100.0000 mg | ORAL_TABLET | Freq: Every day | ORAL | Status: DC
Start: 2022-02-09 — End: 2022-02-13
  Administered 2022-02-09 – 2022-02-13 (×5): 100 mg via ORAL
  Filled 2022-02-09 (×5): qty 1

## 2022-02-09 MED ORDER — FOLIC ACID 1 MG PO TABS
1.0000 mg | ORAL_TABLET | Freq: Every day | ORAL | Status: DC
  Administered 2022-02-09 – 2022-02-13 (×5): 1 mg via ORAL
  Filled 2022-02-09 (×5): qty 1

## 2022-02-09 MED ORDER — OXYCODONE HCL 5 MG PO TABS: 5.0000 mg | ORAL_TABLET | Freq: Four times a day (QID) | ORAL | Status: DC | PRN

## 2022-02-09 MED ORDER — OXYCODONE HCL 5 MG PO TABS
5.0000 mg | ORAL_TABLET | Freq: Four times a day (QID) | ORAL | Status: DC | PRN
  Administered 2022-02-09 – 2022-02-13 (×8): 5 mg via ORAL
  Filled 2022-02-09 (×8): qty 1

## 2022-02-09 MED ORDER — CLONAZEPAM 0.25 MG PO TBDP: 0.2500 mg | ORAL_TABLET | Freq: Two times a day (BID) | ORAL | Status: DC

## 2022-02-09 MED ORDER — POTASSIUM CHLORIDE CRYS ER 20 MEQ PO TBCR
40.0000 meq | EXTENDED_RELEASE_TABLET | Freq: Once | ORAL | Status: AC
  Administered 2022-02-09: 40 meq via ORAL
  Filled 2022-02-09: qty 2

## 2022-02-09 MED ORDER — ENSURE ENLIVE PO LIQD
237.0000 mL | Freq: Two times a day (BID) | ORAL | Status: DC
  Administered 2022-02-09 – 2022-02-10 (×3): 237 mL via ORAL

## 2022-02-09 MED ORDER — FERROUS SULFATE 325 (65 FE) MG PO TABS
325.0000 mg | ORAL_TABLET | Freq: Every day | ORAL | Status: DC
  Administered 2022-02-12 – 2022-02-13 (×2): 325 mg via ORAL
  Filled 2022-02-09 (×2): qty 1

## 2022-02-09 MED ORDER — BETHANECHOL CHLORIDE 10 MG PO TABS
10.0000 mg | ORAL_TABLET | Freq: Three times a day (TID) | ORAL | Status: AC
  Administered 2022-02-09 (×3): 10 mg via ORAL
  Filled 2022-02-09 (×3): qty 1

## 2022-02-09 MED ORDER — ADULT MULTIVITAMIN W/MINERALS CH
1.0000 | ORAL_TABLET | Freq: Every day | ORAL | Status: DC
  Administered 2022-02-09 – 2022-02-13 (×5): 1 via ORAL
  Filled 2022-02-09 (×4): qty 1

## 2022-02-09 MED ORDER — SODIUM CHLORIDE 0.9 % IV SOLN
250.0000 mg | Freq: Every day | INTRAVENOUS | Status: AC
  Administered 2022-02-09 – 2022-02-11 (×3): 250 mg via INTRAVENOUS
  Filled 2022-02-09 (×3): qty 20

## 2022-02-09 MED ORDER — LACTULOSE 10 GM/15ML PO SOLN: 20.0000 g | Freq: Four times a day (QID) | ORAL | Status: DC

## 2022-02-09 MED ORDER — CLONAZEPAM 0.25 MG PO TBDP
0.2500 mg | ORAL_TABLET | Freq: Two times a day (BID) | ORAL | Status: DC
  Administered 2022-02-09 – 2022-02-13 (×9): 0.25 mg via ORAL
  Filled 2022-02-09 (×9): qty 1

## 2022-02-09 MED ORDER — PANTOPRAZOLE SODIUM 40 MG PO TBEC
40.0000 mg | DELAYED_RELEASE_TABLET | Freq: Every day | ORAL | Status: DC
  Administered 2022-02-09: 40 mg via ORAL
  Filled 2022-02-09: qty 1

## 2022-02-09 NOTE — Progress Notes (Signed)
SLP Cancellation Note ? ?Patient Details ?Name: Sherry Baker ?MRN: [id number] ?DOB: December 04, 1965 ? ? ?Cancelled treatment:       Reason Eval/Treat Not Completed: Other (comment) - Pt passed [facility] swallow screen with RN this am; therefore SLP swallow eval not warranted per protocol. Our service will sign off. ? ?Karalee Hauter L. Toshika Parrow, MA CCC/SLP ?Acute Rehabilitation Services ?Office number [phone] ?Pager [phone] ? ? ? ?[name] ?02/09/2022, 8:44 AM ?
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
## 2022-02-09 NOTE — Progress Notes (Signed)
PT at bedside working with pt.
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
## 2022-02-09 NOTE — Progress Notes (Signed)
5W RN unable to take report at this time. Will call back to receive report ?

## 2022-02-10 DIAGNOSIS — G9340 Encephalopathy, unspecified: Secondary | ICD-10-CM | POA: Diagnosis not present

## 2022-02-10 LAB — GLUCOSE, CAPILLARY
Glucose-Capillary: 92 mg/dL (ref 70–99)
Glucose-Capillary: 84 mg/dL (ref 70–99)
Glucose-Capillary: 110 mg/dL — ABNORMAL HIGH (ref 70–99)
Glucose-Capillary: 104 mg/dL — ABNORMAL HIGH (ref 70–99)
Glucose-Capillary: 99 mg/dL (ref 70–99)
Glucose-Capillary: 90 mg/dL (ref 70–99)

## 2022-02-10 MED ORDER — AMLODIPINE BESYLATE 5 MG PO TABS
5.0000 mg | ORAL_TABLET | Freq: Every day | ORAL | Status: DC
  Administered 2022-02-10: 5 mg via ORAL
  Filled 2022-02-10: qty 1

## 2022-02-10 MED ORDER — ENSURE ENLIVE PO LIQD
237.0000 mL | Freq: Three times a day (TID) | ORAL | Status: DC
  Administered 2022-02-10 – 2022-02-13 (×8): 237 mL via ORAL

## 2022-02-10 NOTE — Progress Notes (Signed)
Patient transferred from ICU. CSW will continue to follow for disposition needs and will make [facility] leadership aware as patient has previously been on the Difficult to Place list.  ? ?Sherry Baker ?LCSW, MSW, MHA ? ?
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
## 2022-02-10 NOTE — Consult Note (Signed)
Ostomy consult ? ?Reason for Consult: "raised squishy area around the stoma" ?Wound type: peristomal hernia ?Seen this admission by [place] partner, see consultation note 02/03/22.  ?Ostomy care orders entered, no topical care needed for peristomal hernia.  ?Using 1pc flat with barrier ring due to presence of hernia, allows for flexibility around hernia.  ? ?Ronica [facility], CNS, CWON-AP ?[phone]  ?
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
## 2022-02-10 NOTE — [place] FL2 (Signed)
?[place] MEDICAID FL2 LEVEL OF CARE SCREENING TOOL  ?  ? ?IDENTIFICATION  ?Patient Name: ?Sherry Baker Birthdate: Dec 16, 1965 Sex: female Admission Date (Current Location): ?02/02/2022  ?[place] and [place] Number: ? [place] ?  Facility and Address:  ?[facility], [place] [address], [place],  [address] ?     Provider Number: ?[id number]  ?Attending Physician Name and Address:  ?Damita Lack, MD ? Relative Name and Phone Number:  ?  ?   ?Current Level of Care: ?Hospital Recommended Level of Care: ?[place] Prior Approval Number: ?  ? ?Date Approved/Denied: ?  PASRR Number: ?pending ? ?Discharge Plan: ?SNF ?  ? ?Current Diagnoses: ?Patient Active Problem List  ? Diagnosis Date Noted  ? Protein-calorie malnutrition, severe 02/04/2022  ? Pressure injury of skin 02/03/2022  ? Acute encephalopathy 02/02/2022  ? ? ?Orientation RESPIRATION BLADDER Height & Weight   ?  ?Self, Place ? Normal Continent, External catheter Weight: 102 lb 1.2 oz (46.3 kg) ?Height:  '5\' 1"'$  (154.9 cm)  ?BEHAVIORAL SYMPTOMS/MOOD NEUROLOGICAL BOWEL NUTRITION STATUS  ?    Colostomy, Continent Diet (See dc summary)  ?AMBULATORY STATUS COMMUNICATION OF NEEDS Skin   ?Extensive Assist Verbally PU Stage and Appropriate Care, Other (Comment) (Stage IV on sacrum w/foam dressing; non-pressure wound on anus w/foam dressing) ?  ?  ?  ?    ?     ?     ? ? ?Personal Care Assistance Level of Assistance  ?Bathing, Feeding, Dressing Bathing Assistance: Limited assistance ?Feeding assistance: Limited assistance ?Dressing Assistance: Limited assistance ?   ? ?Functional Limitations Info  ?Sight Sight Info: Impaired (trauma to eye) ?  ?   ? ? ?SPECIAL CARE FACTORS FREQUENCY  ?PT (By licensed PT), OT (By licensed OT)   ?  ?PT Frequency: 2x/week ?OT Frequency: 2x/week ?  ?  ?  ?   ? ? ?Contractures Contractures Info: Not present  ? ? ?Additional Factors Info  ?Code Status, Allergies, Insulin Sliding Scale, Psychotropic,  Isolation Precautions Code Status Info: Full ?Allergies Info: Penicillins ?Psychotropic Info: Klonopin ?Insulin Sliding Scale Info: see dc summary ?Isolation Precautions Info: MRSA ?   ? ?Current Medications (02/10/2022):  This is the current hospital active medication list ?Current Facility-Administered Medications  ?Medication Dose Route Frequency Provider Last Rate Last Admin  ? acetaminophen (TYLENOL) tablet 650 mg  650 mg Per Tube Q4H PRN Spero Geralds, MD   650 mg at 02/02/22 1638  ? albuterol (PROVENTIL) (2.5 MG/3ML) 0.083% nebulizer solution 2.5 mg  2.5 mg Nebulization Q2H PRN Spero Geralds, MD   2.5 mg at 02/03/22 0343  ? amLODipine (NORVASC) tablet 5 mg  5 mg Oral Daily Amin, Ankit Chirag, MD   5 mg at 02/10/22 0955  ? arformoterol (BROVANA) nebulizer solution 15 mcg  15 mcg Nebulization BID Spero Geralds, MD   15 mcg at 02/10/22 0175  ? budesonide (PULMICORT) nebulizer solution 0.25 mg  0.25 mg Nebulization BID Spero Geralds, MD   0.25 mg at 02/10/22 1025  ? chlorhexidine (PERIDEX) 0.12 % solution 15 mL  15 mL Mouth Rinse BID Spero Geralds, MD   15 mL at 02/10/22 0956  ? Chlorhexidine Gluconate Cloth 2 % PADS 6 each  6 each Topical Q0600 Spero Geralds, MD   6 each at 02/10/22 [phone]  ? clonazePAM (KLONOPIN) disintegrating tablet 0.25 mg  0.25 mg Oral BID Spero Geralds, MD   0.25 mg at 02/10/22  1308  ? feeding supplement (ENSURE ENLIVE / ENSURE PLUS) liquid 237 mL  237 mL Oral BID BM Spero Geralds, MD   237 mL at 02/10/22 0958  ? ferric gluconate (FERRLECIT) 250 mg in sodium chloride 0.9 % 250 mL IVPB  250 mg Intravenous Daily Spero Geralds, MD 135 mL/hr at 02/10/22 1152 250 mg at 02/10/22 1152  ? [START ON 02/12/2022] ferrous sulfate tablet 325 mg  325 mg Oral Q breakfast Spero Geralds, MD      ? folic acid (FOLVITE) tablet 1 mg  1 mg Oral Daily Spero Geralds, MD   1 mg at 02/10/22 0956  ? heparin injection 5,000 Units  5,000 Units Subcutaneous Q8H Spero Geralds, MD   5,000 Units at  02/10/22 [phone]  ? insulin aspart (novoLOG) injection 0-15 Units  0-15 Units Subcutaneous Q4H Spero Geralds, MD   2 Units at 02/08/22 0350  ? lactated ringers infusion   Intravenous Continuous Spero Geralds, MD   Stopped at 02/07/22 1647  ? MEDLINE mouth rinse  15 mL Mouth Rinse q12n4p Spero Geralds, MD   15 mL at 02/09/22 1603  ? multivitamin with minerals tablet 1 tablet  1 tablet Oral Daily Spero Geralds, MD   1 tablet at 02/10/22 0955  ? ondansetron (ZOFRAN) injection 4 mg  4 mg Intravenous Q6H PRN Spero Geralds, MD   4 mg at 02/07/22 4696  ? oxyCODONE (Oxy IR/ROXICODONE) immediate release tablet 5 mg  5 mg Oral Q6H PRN Spero Geralds, MD   5 mg at 02/10/22 0400  ? revefenacin (YUPELRI) nebulizer solution 175 mcg  175 mcg Nebulization Daily Spero Geralds, MD   175 mcg at 02/10/22 2952  ? thiamine tablet 100 mg  100 mg Oral Daily Spero Geralds, MD   100 mg at 02/10/22 8413  ? ? ? ?Discharge Medications: ?Please see discharge summary for a list of discharge medications. ? ?Relevant Imaging Results: ? ?Relevant Lab Results: ? ? ?Additional Information ?SSN: [id number]. Moderna COVID-19 Vaccine 09/30/2021 ? ?Lissa Morales Ciara Kagan, LCSW ? ? ? ? ?

## 2022-02-11 DIAGNOSIS — G9340 Encephalopathy, unspecified: Secondary | ICD-10-CM | POA: Diagnosis not present

## 2022-02-11 LAB — GLUCOSE, CAPILLARY
Glucose-Capillary: 88 mg/dL (ref 70–99)
Glucose-Capillary: 97 mg/dL (ref 70–99)
Glucose-Capillary: 99 mg/dL (ref 70–99)
Glucose-Capillary: 114 mg/dL — ABNORMAL HIGH (ref 70–99)
Glucose-Capillary: 161 mg/dL — ABNORMAL HIGH (ref 70–99)
Glucose-Capillary: 86 mg/dL (ref 70–99)

## 2022-02-11 MED ORDER — AMLODIPINE BESYLATE 10 MG PO TABS
10.0000 mg | ORAL_TABLET | Freq: Every day | ORAL | Status: DC
  Administered 2022-02-11 – 2022-02-13 (×3): 10 mg via ORAL
  Filled 2022-02-11 (×3): qty 1

## 2022-02-11 MED ORDER — HYDRALAZINE HCL 25 MG PO TABS
25.0000 mg | ORAL_TABLET | Freq: Three times a day (TID) | ORAL | Status: DC
Start: 2022-02-11 — End: 2022-02-13
  Administered 2022-02-11 – 2022-02-13 (×6): 25 mg via ORAL
  Filled 2022-02-11 (×6): qty 1
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
## 2022-02-11 NOTE — Telephone Encounter (Signed)
I called patient today regarding scheduling an appointment to address the need for incontinence supplies.  Message was left with call back requested to this CM. ?

## 2022-02-11 NOTE — Progress Notes (Signed)
Overnight progress note ? ?RN requesting bilateral wrist restraints to be renewed as patient is still encephalopathic.  I was notified that no sitter is available tonight. ?

## 2022-02-11 NOTE — Telephone Encounter (Signed)
Call placed to [name].  She explained that she is  waiting  for information from provider regarding incontinence supplies, not ostomy supplies, She said that the last order she has is from Dr Chapman Fitch, who is no longer at [facility]. The patient has initiated this request for supplies.  ? ?I explained that she has seen a provider at [facility] recently but the need for incontinence supplies was not addressed. Joaquin [place] said that she needs visit notes that support that need for the supplies.  Patient's next appointment at [facility] is to establish care but it is not until  04/14/2022.   ? ?Attempted to contact patient to discuss rescheduling her appointment to establish care to be seen prior to 04/2022.  She has the option of being seen at one of the other community care clinics.  ?Message left with call back requested to this CM  ?
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
## 2022-02-12 DIAGNOSIS — L89304 Pressure ulcer of unspecified buttock, stage 4: Secondary | ICD-10-CM | POA: Diagnosis not present

## 2022-02-12 DIAGNOSIS — I10 Essential (primary) hypertension: Secondary | ICD-10-CM | POA: Diagnosis not present

## 2022-02-12 DIAGNOSIS — E43 Unspecified severe protein-calorie malnutrition: Secondary | ICD-10-CM | POA: Diagnosis not present

## 2022-02-12 DIAGNOSIS — G9340 Encephalopathy, unspecified: Secondary | ICD-10-CM | POA: Diagnosis not present

## 2022-02-12 LAB — GLUCOSE, CAPILLARY
Glucose-Capillary: 121 mg/dL — ABNORMAL HIGH (ref 70–99)
Glucose-Capillary: 91 mg/dL (ref 70–99)
Glucose-Capillary: 91 mg/dL (ref 70–99)
Glucose-Capillary: 95 mg/dL (ref 70–99)
Glucose-Capillary: 97 mg/dL (ref 70–99)
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
## 2022-02-12 NOTE — Telephone Encounter (Signed)
I attempted to call pt, no answer, left vm. ?

## 2022-02-12 NOTE — Progress Notes (Signed)
No restraints in place at this time when receiving report from night shift Dyanne Carrel, RN.  ?
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
## 2022-02-13 ENCOUNTER — Other Ambulatory Visit (HOSPITAL_COMMUNITY): Payer: Self-pay

## 2022-02-13 DIAGNOSIS — J441 Chronic obstructive pulmonary disease with (acute) exacerbation: Secondary | ICD-10-CM

## 2022-02-13 DIAGNOSIS — E43 Unspecified severe protein-calorie malnutrition: Secondary | ICD-10-CM | POA: Diagnosis not present

## 2022-02-13 DIAGNOSIS — L89304 Pressure ulcer of unspecified buttock, stage 4: Secondary | ICD-10-CM | POA: Diagnosis not present

## 2022-02-13 DIAGNOSIS — G9340 Encephalopathy, unspecified: Secondary | ICD-10-CM | POA: Diagnosis not present

## 2022-02-13 LAB — GLUCOSE, CAPILLARY
Glucose-Capillary: 96 mg/dL (ref 70–99)
Glucose-Capillary: 106 mg/dL — ABNORMAL HIGH (ref 70–99)
Glucose-Capillary: 110 mg/dL — ABNORMAL HIGH (ref 70–99)

## 2022-02-13 LAB — MAGNESIUM: Magnesium: 1.9 mg/dL (ref 1.7–2.4)

## 2022-02-13 LAB — COMPREHENSIVE METABOLIC PANEL
ALT: 15 U/L (ref 0–44)
AST: 17 U/L (ref 15–41)
Albumin: 2.2 g/dL — ABNORMAL LOW (ref 3.5–5.0)
Alkaline Phosphatase: 52 U/L (ref 38–126)
Anion gap: 9 (ref 5–15)
BUN: 21 mg/dL — ABNORMAL HIGH (ref 6–20)
CO2: 21 mmol/L — ABNORMAL LOW (ref 22–32)
Calcium: 8.7 mg/dL — ABNORMAL LOW (ref 8.9–10.3)
Chloride: 106 mmol/L (ref 98–111)
Creatinine, Ser: 1.16 mg/dL — ABNORMAL HIGH (ref 0.44–1.00)
GFR, Estimated: 56 mL/min — ABNORMAL LOW (ref >60)
Glucose, Bld: 89 mg/dL (ref 70–99)
Potassium: 3.5 mmol/L (ref 3.5–5.1)
Sodium: 136 mmol/L (ref 135–145)
Total Bilirubin: 0.5 mg/dL (ref 0.3–1.2)
Total Protein: 6.4 g/dL — ABNORMAL LOW (ref 6.5–8.1)

## 2022-02-13 LAB — CBC
HCT: 28.4 % — ABNORMAL LOW (ref 36.0–46.0)
Hemoglobin: 8.8 g/dL — ABNORMAL LOW (ref 12.0–15.0)
MCH: 26 pg (ref 26.0–34.0)
MCHC: 31 g/dL (ref 30.0–36.0)
MCV: 83.8 fL (ref 80.0–100.0)
Platelets: 306 10*3/uL (ref 150–400)
RBC: 3.39 MIL/uL — ABNORMAL LOW (ref 3.87–5.11)
RDW: 17.5 % — ABNORMAL HIGH (ref 11.5–15.5)
WBC: 8.1 10*3/uL (ref 4.0–10.5)
nRBC: 0.6 % — ABNORMAL HIGH (ref 0.0–0.2)

## 2022-02-13 LAB — PHOSPHORUS: Phosphorus: 3.5 mg/dL (ref 2.5–4.6)

## 2022-02-13 MED ORDER — AMLODIPINE BESYLATE 5 MG PO TABS
5.0000 mg | ORAL_TABLET | Freq: Every day | ORAL | 0 refills | Status: DC
  Filled 2022-02-13: qty 30, 30d supply, fill #0

## 2022-02-13 MED ORDER — ALBUTEROL SULFATE HFA 108 (90 BASE) MCG/ACT IN AERS
2.0000 | INHALATION_SPRAY | Freq: Four times a day (QID) | RESPIRATORY_TRACT | 0 refills | Status: DC | PRN
  Filled 2022-02-13: qty 18, 25d supply, fill #0

## 2022-02-13 MED ORDER — CLONIDINE HCL 0.1 MG PO TABS
0.1000 mg | ORAL_TABLET | Freq: Two times a day (BID) | ORAL | 2 refills | Status: DC
  Filled 2022-02-13: qty 60, 30d supply, fill #0

## 2022-02-13 MED ORDER — SPIRIVA HANDIHALER 18 MCG IN CAPS
18.0000 ug | ORAL_CAPSULE | Freq: Every day | RESPIRATORY_TRACT | 2 refills | Status: DC
  Filled 2022-02-13: qty 30, 30d supply, fill #0

## 2022-02-13 MED ORDER — BUDESONIDE-FORMOTEROL FUMARATE 160-4.5 MCG/ACT IN AERO
2.0000 | INHALATION_SPRAY | Freq: Two times a day (BID) | RESPIRATORY_TRACT | 2 refills | Status: DC
  Filled 2022-02-13: qty 10.2, 30d supply, fill #0

## 2022-02-13 MED ORDER — BUSPIRONE HCL 15 MG PO TABS
15.0000 mg | ORAL_TABLET | Freq: Two times a day (BID) | ORAL | 0 refills | Status: DC
  Filled 2022-02-13: qty 60, 30d supply, fill #0
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
## 2022-02-13 NOTE — Progress Notes (Signed)
Discharged from unit by wheelchair. Alert with no distress noted when leaving unit with SotheavyTan, RN. Ostomy supplies sent with patient along with meds from [facility].  ?

## 2022-02-13 NOTE — TOC Transition Note (Addendum)
Transition of Care (TOC) - CM/SW Discharge Note ? ? ?Patient Details  ?Name: Sherry Baker ?MRN: [id number] ?Date of Birth: 1966-10-12 ? ?Transition of Care (TOC) CM/SW Contact:  ?Benard Halsted, LCSW ?Phone Number: ?02/13/2022, 9:58 AM ? ? ?Clinical Narrative:    ?Patient requesting to discharge home today. CSW made appointment for patient at [facility]. They will contact her if a sooner appointment can be made. MD to send medications to [facility]. CSW sent referral to outpatient rehab at [facility] as home health unable to be obtained for patient; instructions placed on AVS. CSW requested RN send extra colostomy supplies with patient for her chronic colostomy. No other needs identified at this time.  ? ?Patient requesting taxi voucher home as she does not have a ride. Placing on hard chart.  ? ? ?Final next level of care: OP Rehab ?Barriers to Discharge: Barriers Resolved ? ? ?Patient Goals and CMS Choice ?Patient states their goals for this hospitalization and ongoing recovery are:: Return home ?CMS Medicare.gov Compare Post Acute Care list provided to:: Patient ?Choice offered to / list presented to : Patient ? ?Discharge Placement ?  ?           ?  ?  ?  ?Patient and family notified of of transfer: 02/13/22 ? ?Discharge Plan and Services ?In-house Referral: Clinical Social Work ?  ?           ?  ?  ?  ?  ?  ?  ?  ?  ?  ?  ? ?Social Determinants of Health (SDOH) Interventions ?  ? ? ?Readmission Risk Interventions ?   ? View : No data to display.  ?  ?  ?  ? ? ? ? ? ?
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
## 2022-02-13 NOTE — Progress Notes (Signed)
Discharge instructions given, verbalized understanding.  Pt discharged home stable. ?

## 2022-02-14 ENCOUNTER — Encounter: Payer: Self-pay | Admitting: Physician Assistant

## 2022-02-17 NOTE — Telephone Encounter (Signed)
Pt called in to update provider. Pt says that she spoke with [facility] and was told to have provider to fax over a Rx and notes to them for pt's Colonoscopy bag and Depends.  ? ? ?Phone: [phone] ? ?Fax: [phone]  ? ?Please assist pt further.  ?

## 2022-02-17 NOTE — Telephone Encounter (Signed)
Called patient, left voicemail at 2nd line of contact.  Attempt to schedule virtual apt with Provider Raul Del tomorrow to address incontinence supplies.

## 2022-02-18 ENCOUNTER — Ambulatory Visit: Payer: Medicaid Other | Admitting: Pharmacist

## 2022-02-18 NOTE — Telephone Encounter (Signed)
Attempted to contact the patient to inform her that she needs a visit ,in person or virtual, to address the need for incontinence products prior to submitting the order. ?Call placed to # [phone], message left with call back requested.  ?Call placed to # [phone] and the recording stated that the call could not be completed at this time ?

## 2022-02-19 ENCOUNTER — Telehealth (HOSPITAL_COMMUNITY): Payer: Self-pay

## 2022-02-19 NOTE — Telephone Encounter (Signed)
TOC OUTPATIENT PHARMACY FOLLOW-UP ?Call #1 attempted for [facility] pharmacy follow-up.  Was unable to leave VM. Will follow up in 2-3 days.  ? ?

## 2022-02-21 ENCOUNTER — Other Ambulatory Visit: Payer: Self-pay

## 2022-02-21 ENCOUNTER — Telehealth: Payer: Self-pay

## 2022-02-21 NOTE — Telephone Encounter (Signed)
Transitions of Care Pharmacy  ? ?Call attempted for a pharmacy transitions of care follow-up. HIPAA appropriate voicemail was left with call back information provided.  ? ?Call attempt #2. Will follow-up in 2-3 days.  ?  ? ?Darcus Austin, PharmD ?Clinical Pharmacist ?[facility] ?02/21/2022 2:35 PM ? ?

## 2022-02-22 ENCOUNTER — Other Ambulatory Visit (HOSPITAL_COMMUNITY): Payer: Self-pay

## 2022-02-22 ENCOUNTER — Telehealth (HOSPITAL_COMMUNITY): Payer: Self-pay

## 2022-02-22 NOTE — Telephone Encounter (Signed)
Transitions of Care Pharmacy  ? ?Call attempted for a pharmacy transitions of care follow-up.  ? ?Call attempt #3. Final Attempt ? ?Darcus Austin, PharmD ?Clinical Pharmacist ?[facility] ?02/22/2022 2:11 PM ? ?  ?

## 2022-03-04 ENCOUNTER — Telehealth: Payer: Medicaid Other | Admitting: Nurse Practitioner

## 2022-03-04 ENCOUNTER — Telehealth: Payer: Self-pay | Admitting: Nurse Practitioner

## 2022-03-04 NOTE — Telephone Encounter (Signed)
She has an appointment scheduled with Dr Wynetta Emery  - 03/13/2022.  ?

## 2022-03-04 NOTE — Telephone Encounter (Signed)
No answer. LVM ?

## 2022-03-04 NOTE — Telephone Encounter (Signed)
No answer [phone]  ?LVM that Sherry Baker will need to sign in to mychart for video visit ?Has to be face to face for supplies to be renewed ?I did also send her a mychart link to her phone number to get set up.  ?I left this info on her VM ? ?

## 2022-03-05 ENCOUNTER — Emergency Department (HOSPITAL_COMMUNITY): Payer: Medicaid Other

## 2022-03-05 ENCOUNTER — Encounter (HOSPITAL_COMMUNITY): Payer: Self-pay | Admitting: Internal Medicine

## 2022-03-05 ENCOUNTER — Inpatient Hospital Stay (HOSPITAL_COMMUNITY)
Admission: EM | Admit: 2022-03-05 | Discharge: 2022-03-15 | DRG: 894 | Discharge status: Against Medical Advice | Payer: Medicaid Other | Attending: Internal Medicine | Admitting: Internal Medicine

## 2022-03-05 DIAGNOSIS — E039 Hypothyroidism, unspecified: Secondary | ICD-10-CM | POA: Diagnosis present

## 2022-03-05 DIAGNOSIS — F1410 Cocaine abuse, uncomplicated: Secondary | ICD-10-CM | POA: Diagnosis present

## 2022-03-05 DIAGNOSIS — N390 Urinary tract infection, site not specified: Secondary | ICD-10-CM | POA: Diagnosis present

## 2022-03-05 DIAGNOSIS — R4182 Altered mental status, unspecified: Principal | ICD-10-CM

## 2022-03-05 DIAGNOSIS — E8729 Other acidosis: Secondary | ICD-10-CM | POA: Diagnosis present

## 2022-03-05 DIAGNOSIS — I10 Essential (primary) hypertension: Secondary | ICD-10-CM | POA: Diagnosis present

## 2022-03-05 DIAGNOSIS — E162 Hypoglycemia, unspecified: Secondary | ICD-10-CM | POA: Diagnosis present

## 2022-03-05 DIAGNOSIS — L8990 Pressure ulcer of unspecified site, unspecified stage: Secondary | ICD-10-CM | POA: Insufficient documentation

## 2022-03-05 DIAGNOSIS — R7989 Other specified abnormal findings of blood chemistry: Secondary | ICD-10-CM | POA: Diagnosis present

## 2022-03-05 DIAGNOSIS — Z978 Presence of other specified devices: Secondary | ICD-10-CM

## 2022-03-05 DIAGNOSIS — E870 Hyperosmolality and hypernatremia: Secondary | ICD-10-CM | POA: Diagnosis present

## 2022-03-05 DIAGNOSIS — E43 Unspecified severe protein-calorie malnutrition: Secondary | ICD-10-CM | POA: Insufficient documentation

## 2022-03-05 DIAGNOSIS — G9340 Encephalopathy, unspecified: Secondary | ICD-10-CM | POA: Diagnosis present

## 2022-03-05 DIAGNOSIS — N3000 Acute cystitis without hematuria: Secondary | ICD-10-CM

## 2022-03-05 DIAGNOSIS — F10139 Alcohol abuse with withdrawal, unspecified: Secondary | ICD-10-CM | POA: Diagnosis present

## 2022-03-05 DIAGNOSIS — Z8614 Personal history of Methicillin resistant Staphylococcus aureus infection: Secondary | ICD-10-CM

## 2022-03-05 DIAGNOSIS — G9341 Metabolic encephalopathy: Secondary | ICD-10-CM | POA: Diagnosis present

## 2022-03-05 DIAGNOSIS — Z781 Physical restraint status: Secondary | ICD-10-CM

## 2022-03-05 DIAGNOSIS — F419 Anxiety disorder, unspecified: Secondary | ICD-10-CM | POA: Diagnosis present

## 2022-03-05 DIAGNOSIS — W19XXXA Unspecified fall, initial encounter: Secondary | ICD-10-CM | POA: Diagnosis present

## 2022-03-05 DIAGNOSIS — J441 Chronic obstructive pulmonary disease with (acute) exacerbation: Secondary | ICD-10-CM | POA: Diagnosis present

## 2022-03-05 DIAGNOSIS — Y92009 Unspecified place in unspecified non-institutional (private) residence as the place of occurrence of the external cause: Secondary | ICD-10-CM | POA: Diagnosis not present

## 2022-03-05 DIAGNOSIS — R4702 Dysphasia: Secondary | ICD-10-CM | POA: Diagnosis present

## 2022-03-05 DIAGNOSIS — S0083XA Contusion of other part of head, initial encounter: Secondary | ICD-10-CM | POA: Diagnosis present

## 2022-03-05 DIAGNOSIS — Z79891 Long term (current) use of opiate analgesic: Secondary | ICD-10-CM

## 2022-03-05 DIAGNOSIS — R5381 Other malaise: Secondary | ICD-10-CM | POA: Diagnosis present

## 2022-03-05 DIAGNOSIS — L89154 Pressure ulcer of sacral region, stage 4: Secondary | ICD-10-CM | POA: Diagnosis present

## 2022-03-05 DIAGNOSIS — J9601 Acute respiratory failure with hypoxia: Secondary | ICD-10-CM | POA: Diagnosis present

## 2022-03-05 DIAGNOSIS — R6520 Severe sepsis without septic shock: Secondary | ICD-10-CM | POA: Diagnosis not present

## 2022-03-05 DIAGNOSIS — Z8701 Personal history of pneumonia (recurrent): Secondary | ICD-10-CM

## 2022-03-05 DIAGNOSIS — I129 Hypertensive chronic kidney disease with stage 1 through stage 4 chronic kidney disease, or unspecified chronic kidney disease: Secondary | ICD-10-CM | POA: Diagnosis present

## 2022-03-05 DIAGNOSIS — S51811A Laceration without foreign body of right forearm, initial encounter: Secondary | ICD-10-CM | POA: Diagnosis present

## 2022-03-05 DIAGNOSIS — A4181 Sepsis due to Enterococcus: Secondary | ICD-10-CM | POA: Diagnosis not present

## 2022-03-05 DIAGNOSIS — B957 Other staphylococcus as the cause of diseases classified elsewhere: Secondary | ICD-10-CM | POA: Diagnosis present

## 2022-03-05 DIAGNOSIS — E878 Other disorders of electrolyte and fluid balance, not elsewhere classified: Secondary | ICD-10-CM | POA: Diagnosis present

## 2022-03-05 DIAGNOSIS — F319 Bipolar disorder, unspecified: Secondary | ICD-10-CM | POA: Diagnosis present

## 2022-03-05 DIAGNOSIS — E876 Hypokalemia: Secondary | ICD-10-CM | POA: Diagnosis present

## 2022-03-05 DIAGNOSIS — F1413 Cocaine abuse, unspecified with withdrawal: Secondary | ICD-10-CM | POA: Diagnosis present

## 2022-03-05 DIAGNOSIS — D638 Anemia in other chronic diseases classified elsewhere: Secondary | ICD-10-CM | POA: Diagnosis present

## 2022-03-05 DIAGNOSIS — E8721 Acute metabolic acidosis: Secondary | ICD-10-CM | POA: Diagnosis present

## 2022-03-05 DIAGNOSIS — R911 Solitary pulmonary nodule: Secondary | ICD-10-CM | POA: Diagnosis present

## 2022-03-05 DIAGNOSIS — G894 Chronic pain syndrome: Secondary | ICD-10-CM | POA: Diagnosis present

## 2022-03-05 DIAGNOSIS — Z933 Colostomy status: Secondary | ICD-10-CM

## 2022-03-05 DIAGNOSIS — R7881 Bacteremia: Secondary | ICD-10-CM | POA: Diagnosis not present

## 2022-03-05 DIAGNOSIS — J810 Acute pulmonary edema: Secondary | ICD-10-CM | POA: Diagnosis present

## 2022-03-05 DIAGNOSIS — Z9181 History of falling: Secondary | ICD-10-CM

## 2022-03-05 DIAGNOSIS — R34 Anuria and oliguria: Secondary | ICD-10-CM | POA: Diagnosis present

## 2022-03-05 DIAGNOSIS — Z681 Body mass index (BMI) 19 or less, adult: Secondary | ICD-10-CM | POA: Diagnosis not present

## 2022-03-05 DIAGNOSIS — N170 Acute kidney failure with tubular necrosis: Secondary | ICD-10-CM | POA: Diagnosis present

## 2022-03-05 DIAGNOSIS — Z85048 Personal history of other malignant neoplasm of rectum, rectosigmoid junction, and anus: Secondary | ICD-10-CM

## 2022-03-05 DIAGNOSIS — Z9049 Acquired absence of other specified parts of digestive tract: Secondary | ICD-10-CM

## 2022-03-05 DIAGNOSIS — N1832 Chronic kidney disease, stage 3b: Secondary | ICD-10-CM | POA: Diagnosis present

## 2022-03-05 DIAGNOSIS — I340 Nonrheumatic mitral (valve) insufficiency: Secondary | ICD-10-CM | POA: Diagnosis not present

## 2022-03-05 DIAGNOSIS — R680 Hypothermia, not associated with low environmental temperature: Secondary | ICD-10-CM | POA: Diagnosis present

## 2022-03-05 DIAGNOSIS — T502X5A Adverse effect of carbonic-anhydrase inhibitors, benzothiadiazides and other diuretics, initial encounter: Secondary | ICD-10-CM | POA: Diagnosis present

## 2022-03-05 HISTORY — DX: Essential (primary) hypertension: I10

## 2022-03-05 HISTORY — DX: Chronic pain syndrome: G89.4

## 2022-03-05 HISTORY — DX: Hypothyroidism, unspecified: E03.9

## 2022-03-05 LAB — CBC WITH DIFFERENTIAL/PLATELET
Abs Immature Granulocytes: 0.03 10*3/uL (ref 0.00–0.07)
Basophils Absolute: 0 10*3/uL (ref 0.0–0.1)
Basophils Relative: 0 %
Eosinophils Absolute: 0.1 10*3/uL (ref 0.0–0.5)
Eosinophils Relative: 2 %
HCT: 38.9 % (ref 36.0–46.0)
Hemoglobin: 11.8 g/dL — ABNORMAL LOW (ref 12.0–15.0)
Immature Granulocytes: 1 %
Lymphocytes Relative: 11 %
Lymphs Abs: 0.7 10*3/uL (ref 0.7–4.0)
MCH: 27.9 pg (ref 26.0–34.0)
MCHC: 30.3 g/dL (ref 30.0–36.0)
MCV: 92 fL (ref 80.0–100.0)
Monocytes Absolute: 0.6 10*3/uL (ref 0.1–1.0)
Monocytes Relative: 9 %
Neutro Abs: 4.8 10*3/uL (ref 1.7–7.7)
Neutrophils Relative %: 77 %
Platelets: 218 10*3/uL (ref 150–400)
RBC: 4.23 MIL/uL (ref 3.87–5.11)
RDW: 20 % — ABNORMAL HIGH (ref 11.5–15.5)
WBC: 6.2 10*3/uL (ref 4.0–10.5)
nRBC: 0 % (ref 0.0–0.2)

## 2022-03-05 LAB — COMPREHENSIVE METABOLIC PANEL
ALT: 14 U/L (ref 0–44)
AST: 24 U/L (ref 15–41)
Albumin: 3 g/dL — ABNORMAL LOW (ref 3.5–5.0)
Alkaline Phosphatase: 77 U/L (ref 38–126)
Anion gap: 16 — ABNORMAL HIGH (ref 5–15)
BUN: 26 mg/dL — ABNORMAL HIGH (ref 6–20)
CO2: 17 mmol/L — ABNORMAL LOW (ref 22–32)
Calcium: 9.6 mg/dL (ref 8.9–10.3)
Chloride: 115 mmol/L — ABNORMAL HIGH (ref 98–111)
Creatinine, Ser: 1.46 mg/dL — ABNORMAL HIGH (ref 0.44–1.00)
GFR, Estimated: 41 mL/min — ABNORMAL LOW (ref >60)
Glucose, Bld: 66 mg/dL — ABNORMAL LOW (ref 70–99)
Potassium: 4.3 mmol/L (ref 3.5–5.1)
Sodium: 148 mmol/L — ABNORMAL HIGH (ref 135–145)
Total Bilirubin: 0.9 mg/dL (ref 0.3–1.2)
Total Protein: 7.1 g/dL (ref 6.5–8.1)

## 2022-03-05 LAB — RAPID URINE DRUG SCREEN, HOSP PERFORMED
Amphetamines: NOT DETECTED
Barbiturates: NOT DETECTED
Benzodiazepines: POSITIVE — AB
Cocaine: POSITIVE — AB
Opiates: NOT DETECTED
Tetrahydrocannabinol: NOT DETECTED

## 2022-03-05 LAB — URINALYSIS, ROUTINE W REFLEX MICROSCOPIC
Bilirubin Urine: NEGATIVE
Glucose, UA: 50 mg/dL — AB
Hgb urine dipstick: NEGATIVE
Ketones, ur: 20 mg/dL — AB
Nitrite: NEGATIVE
Protein, ur: 100 mg/dL — AB
Specific Gravity, Urine: 1.014 (ref 1.005–1.030)
WBC, UA: >50 WBC/hpf — ABNORMAL HIGH (ref 0–5)
pH: 7 (ref 5.0–8.0)

## 2022-03-05 LAB — LACTIC ACID, PLASMA: Lactic Acid, Venous: 1.7 mmol/L (ref 0.5–1.9)

## 2022-03-05 LAB — TSH: TSH: 1.984 u[IU]/mL (ref 0.350–4.500)

## 2022-03-05 LAB — CBG MONITORING, ED
Glucose-Capillary: 67 mg/dL — ABNORMAL LOW (ref 70–99)
Glucose-Capillary: 164 mg/dL — ABNORMAL HIGH (ref 70–99)

## 2022-03-05 MED ORDER — LORAZEPAM 2 MG/ML IJ SOLN
1.0000 mg | Freq: Once | INTRAMUSCULAR | Status: AC
Start: 2022-03-05 — End: 2022-03-05
  Administered 2022-03-05: 1 mg via INTRAVENOUS
  Filled 2022-03-05: qty 1

## 2022-03-05 MED ORDER — DEXTROSE 50 % IV SOLN
1.0000 | Freq: Once | INTRAVENOUS | Status: AC
  Administered 2022-03-05: 50 mL via INTRAVENOUS
  Filled 2022-03-05: qty 50

## 2022-03-05 MED ORDER — DEXTROSE 50 % IV SOLN
1.0000 | INTRAVENOUS | Status: DC | PRN
Start: 2022-03-05 — End: 2022-03-15
  Administered 2022-03-06: 50 mL via INTRAVENOUS
  Filled 2022-03-05: qty 50

## 2022-03-05 MED ORDER — LORAZEPAM 2 MG/ML IJ SOLN
1.0000 mg | Freq: Once | INTRAMUSCULAR | Status: AC
  Administered 2022-03-05: 1 mg via INTRAVENOUS
  Filled 2022-03-05: qty 1

## 2022-03-05 MED ORDER — ACETAMINOPHEN 325 MG PO TABS: 650.0000 mg | ORAL_TABLET | Freq: Four times a day (QID) | ORAL | Status: DC | PRN

## 2022-03-05 MED ORDER — ACETAMINOPHEN 650 MG RE SUPP: 650.0000 mg | Freq: Four times a day (QID) | RECTAL | Status: DC | PRN

## 2022-03-05 MED ORDER — HALOPERIDOL LACTATE 5 MG/ML IJ SOLN
2.0000 mg | Freq: Once | INTRAMUSCULAR | Status: AC
  Administered 2022-03-06: 2 mg via INTRAVENOUS
  Filled 2022-03-05: qty 1

## 2022-03-05 MED ORDER — SODIUM CHLORIDE 0.9 % IV BOLUS
1000.0000 mL | Freq: Once | INTRAVENOUS | Status: AC
  Administered 2022-03-05: 1000 mL via INTRAVENOUS

## 2022-03-05 NOTE — ED Notes (Signed)
Radiology at bedside

## 2022-03-05 NOTE — ED Notes (Signed)
Provider at bedside
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
## 2022-03-05 NOTE — ED Provider Notes (Signed)
?[facility] ?Provider Note ? ? ?CSN: [id number] ?Arrival date & time: 03/05/22  2110 ? ?  ? ?History ? ?Chief Complaint  ?Patient presents with  ? Altered Mental Status  ? ? ?Sherry Baker is a 56 y.o. female. ? ? ?Altered Mental Status ?Patient brought in for mental status change.  Reportedly has had multiple falls at home her last couple days.  Had been seen by EMS twice today and refused transport.  Then reportedly decreased mental status.  Unknown past history.  Has colostomy.  Had been reportedly covered in stool.  Given 5 mg of Versed by EMS for being combative.  Patient cannot really provide any history.  Mild hyperglycemia upon arrival.  Does have hematoma to right forehead. ?  ? ?Home Medications ?Prior to Admission medications   ?Not on File  ?   ? ?Allergies    ?Patient has no allergy information on record.   ? ?Review of Systems   ?Review of Systems ? ?Physical Exam ?Updated Vital Signs ?BP (!) 151/85   Pulse (!) 106   Temp (!) 97.4 ?F (36.3 ?C)   Resp 18   SpO2 97%  ?Physical Exam ?Vitals and nursing note reviewed.  ?HENT:  ?   Head:  ?   Comments: Hematoma right forehead. ?Eyes:  ?   Pupils: Pupils are equal, round, and reactive to light.  ?Cardiovascular:  ?   Rate and Rhythm: Tachycardia present.  ?Pulmonary:  ?   Breath sounds: No wheezing or rhonchi.  ?Abdominal:  ?   Tenderness: There is abdominal tenderness.  ?   Comments: Mild potential diffuse abdominal tenderness.  No mass palpated.    ?Musculoskeletal:     ?   General: Tenderness present.  ?   Comments: Possible pain with movement at the hips.  Mild skin tears on right forearm.  No underlying bony tenderness.  ?Neurological:  ?   Comments: Patient is altered.  Nonverbal.  Will really not follow commands.  Moving all extremities however.    ?Perineal area has potential ulcers and potential missing skin does not appear infected.  Chronic changes. ? ?ED Results / Procedures / Treatments   ?Labs ?(all labs  ordered are listed, but only abnormal results are displayed) ?Labs Reviewed  ?COMPREHENSIVE METABOLIC PANEL - Abnormal; Notable for the following components:  ?    Result Value  ? Sodium 148 (*)   ? Chloride 115 (*)   ? CO2 17 (*)   ? Glucose, Bld 66 (*)   ? BUN 26 (*)   ? Creatinine, Ser 1.46 (*)   ? Albumin 3.0 (*)   ? GFR, Estimated 41 (*)   ? Anion gap 16 (*)   ? All other components within normal limits  ?RAPID URINE DRUG SCREEN, HOSP PERFORMED - Abnormal; Notable for the following components:  ? Cocaine POSITIVE (*)   ? Benzodiazepines POSITIVE (*)   ? All other components within normal limits  ?URINALYSIS, ROUTINE W REFLEX MICROSCOPIC - Abnormal; Notable for the following components:  ? Glucose, UA 50 (*)   ? Ketones, ur 20 (*)   ? Protein, ur 100 (*)   ? Leukocytes,Ua SMALL (*)   ? WBC, UA >50 (*)   ? Bacteria, UA RARE (*)   ? All other components within normal limits  ?CBC WITH DIFFERENTIAL/PLATELET - Abnormal; Notable for the following components:  ? Hemoglobin 11.8 (*)   ? RDW 20.0 (*)   ? All other components within  normal limits  ?CBG MONITORING, ED - Abnormal; Notable for the following components:  ? Glucose-Capillary 67 (*)   ? All other components within normal limits  ?CBG MONITORING, ED - Abnormal; Notable for the following components:  ? Glucose-Capillary 164 (*)   ? All other components within normal limits  ?CULTURE, BLOOD (ROUTINE X 2)  ?CULTURE, BLOOD (ROUTINE X 2)  ?LACTIC ACID, PLASMA  ?TSH  ?ETHANOL  ?LACTIC ACID, PLASMA  ?MAGNESIUM  ?MAGNESIUM  ?COMPREHENSIVE METABOLIC PANEL  ?CBC WITH DIFFERENTIAL/PLATELET  ? ? ?EKG ?EKG Interpretation ? ?Date/Time:  Wednesday Mar 05 2022 22:45:21 EDT ?Ventricular Rate:  119 ?PR Interval:  130 ?QRS Duration: 91 ?QT Interval:  338 ?QTC Calculation: 476 ?R Axis:   87 ?Text Interpretation: Ectopic atrial tachycardia, unifocal Probable left atrial enlargement Left ventricular hypertrophy Nonspecific T abnormalities, lateral leads Confirmed by Davonna Belling [id number]) on 03/05/2022 11:20:40 PM ? ?Radiology ?CT HEAD WO CONTRAST (5MM) ? ?Result Date: 03/05/2022 ?CLINICAL DATA:  Multiple recent falls with headaches and neck pain, initial encounter EXAM: CT HEAD WITHOUT CONTRAST CT CERVICAL SPINE WITHOUT CONTRAST TECHNIQUE: Multidetector CT imaging of the head and cervical spine was performed following the standard protocol without intravenous contrast. Multiplanar CT image reconstructions of the cervical spine were also generated. RADIATION DOSE REDUCTION: This exam was performed according to the departmental dose-optimization program which includes automated exposure control, adjustment of the mA and/or kV according to patient size and/or use of iterative reconstruction technique. COMPARISON:  None Available. FINDINGS: CT HEAD FINDINGS Brain: No evidence of acute infarction, hemorrhage, hydrocephalus, extra-axial collection or mass lesion/mass effect. Mild chronic white matter ischemic changes noted. Vascular: No hyperdense vessel or unexpected calcification. Skull: Normal. Negative for fracture or focal lesion. Sinuses/Orbits: No acute finding. Other: None. CT CERVICAL SPINE FINDINGS Alignment: Mild loss of normal cervical lordosis is noted. Skull base and vertebrae: 7 cervical segments are well visualized. Vertebral body height is well maintained. Osteophytic changes are noted from C4-C7. Mild facet hypertrophic changes are seen. Soft tissues and spinal canal: Surrounding soft tissue structures are within normal limits. Upper chest: Visualized lung apices are unremarkable. Other: None IMPRESSION: CT of the head: No acute intracranial abnormality noted. Mild chronic white matter ischemic changes are noted. CT of the cervical spine: Multilevel degenerative changes are seen without acute abnormality. Electronically Signed   By: Inez Catalina M.D.   On: 03/05/2022 22:32  ? ?CT Cervical Spine Wo Contrast ? ?Result Date: 03/05/2022 ?CLINICAL DATA:  Multiple recent falls with  headaches and neck pain, initial encounter EXAM: CT HEAD WITHOUT CONTRAST CT CERVICAL SPINE WITHOUT CONTRAST TECHNIQUE: Multidetector CT imaging of the head and cervical spine was performed following the standard protocol without intravenous contrast. Multiplanar CT image reconstructions of the cervical spine were also generated. RADIATION DOSE REDUCTION: This exam was performed according to the departmental dose-optimization program which includes automated exposure control, adjustment of the mA and/or kV according to patient size and/or use of iterative reconstruction technique. COMPARISON:  None Available. FINDINGS: CT HEAD FINDINGS Brain: No evidence of acute infarction, hemorrhage, hydrocephalus, extra-axial collection or mass lesion/mass effect. Mild chronic white matter ischemic changes noted. Vascular: No hyperdense vessel or unexpected calcification. Skull: Normal. Negative for fracture or focal lesion. Sinuses/Orbits: No acute finding. Other: None. CT CERVICAL SPINE FINDINGS Alignment: Mild loss of normal cervical lordosis is noted. Skull base and vertebrae: 7 cervical segments are well visualized. Vertebral body height is well maintained. Osteophytic changes are noted from C4-C7. Mild facet hypertrophic changes are seen.  Soft tissues and spinal canal: Surrounding soft tissue structures are within normal limits. Upper chest: Visualized lung apices are unremarkable. Other: None IMPRESSION: CT of the head: No acute intracranial abnormality noted. Mild chronic white matter ischemic changes are noted. CT of the cervical spine: Multilevel degenerative changes are seen without acute abnormality. Electronically Signed   By: Inez Catalina M.D.   On: 03/05/2022 22:32  ? ?DG Pelvis Portable ? ?Result Date: 03/05/2022 ?CLINICAL DATA:  Multiple fall EXAM: PORTABLE PELVIS 1-2 VIEWS COMPARISON:  None Available. FINDINGS: Limited by patient motion. SI joints are non widened. Pubic symphysis grossly intact. 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 There may be AVN of the femoral heads with fragmentation but limited by motion IMPRESSION: Limited by motion.

## 2022-03-05 NOTE — ED Notes (Signed)
Pt on arrival was covered in stool. Colostomy bag not appropriately sealed. Unsure when the last time it was changed. Pt cleaned at this time, gown place. New colostomy bag placed at this time ?

## 2022-03-05 NOTE — ED Notes (Signed)
Pt transported to ct at this time 

## 2022-03-06 ENCOUNTER — Inpatient Hospital Stay (HOSPITAL_COMMUNITY): Payer: Medicaid Other

## 2022-03-06 DIAGNOSIS — N390 Urinary tract infection, site not specified: Secondary | ICD-10-CM | POA: Diagnosis present

## 2022-03-06 DIAGNOSIS — F1410 Cocaine abuse, uncomplicated: Secondary | ICD-10-CM | POA: Diagnosis present

## 2022-03-06 DIAGNOSIS — Z978 Presence of other specified devices: Secondary | ICD-10-CM

## 2022-03-06 DIAGNOSIS — E870 Hyperosmolality and hypernatremia: Secondary | ICD-10-CM | POA: Diagnosis present

## 2022-03-06 DIAGNOSIS — R7989 Other specified abnormal findings of blood chemistry: Secondary | ICD-10-CM | POA: Diagnosis present

## 2022-03-06 DIAGNOSIS — E162 Hypoglycemia, unspecified: Secondary | ICD-10-CM | POA: Diagnosis present

## 2022-03-06 DIAGNOSIS — E039 Hypothyroidism, unspecified: Secondary | ICD-10-CM | POA: Diagnosis present

## 2022-03-06 DIAGNOSIS — R4182 Altered mental status, unspecified: Secondary | ICD-10-CM | POA: Diagnosis present

## 2022-03-06 DIAGNOSIS — E8729 Other acidosis: Secondary | ICD-10-CM | POA: Diagnosis present

## 2022-03-06 DIAGNOSIS — I10 Essential (primary) hypertension: Secondary | ICD-10-CM | POA: Diagnosis present

## 2022-03-06 DIAGNOSIS — G9341 Metabolic encephalopathy: Secondary | ICD-10-CM | POA: Diagnosis present

## 2022-03-06 LAB — CBC WITH DIFFERENTIAL/PLATELET
Abs Immature Granulocytes: 0.02 10*3/uL (ref 0.00–0.07)
Basophils Absolute: 0 10*3/uL (ref 0.0–0.1)
Basophils Relative: 0 %
Eosinophils Absolute: 0.1 10*3/uL (ref 0.0–0.5)
Eosinophils Relative: 2 %
HCT: 36.7 % (ref 36.0–46.0)
Hemoglobin: 11.2 g/dL — ABNORMAL LOW (ref 12.0–15.0)
Immature Granulocytes: 0 %
Lymphocytes Relative: 25 %
Lymphs Abs: 1.5 10*3/uL (ref 0.7–4.0)
MCH: 27.5 pg (ref 26.0–34.0)
MCHC: 30.5 g/dL (ref 30.0–36.0)
MCV: 90.2 fL (ref 80.0–100.0)
Monocytes Absolute: 0.7 10*3/uL (ref 0.1–1.0)
Monocytes Relative: 12 %
Neutro Abs: 3.7 10*3/uL (ref 1.7–7.7)
Neutrophils Relative %: 61 %
Platelets: 229 10*3/uL (ref 150–400)
RBC: 4.07 MIL/uL (ref 3.87–5.11)
RDW: 19.8 % — ABNORMAL HIGH (ref 11.5–15.5)
WBC: 6.1 10*3/uL (ref 4.0–10.5)
nRBC: 0 % (ref 0.0–0.2)

## 2022-03-06 LAB — CBG MONITORING, ED
Glucose-Capillary: 55 mg/dL — ABNORMAL LOW (ref 70–99)
Glucose-Capillary: 123 mg/dL — ABNORMAL HIGH (ref 70–99)
Glucose-Capillary: 98 mg/dL (ref 70–99)
Glucose-Capillary: 130 mg/dL — ABNORMAL HIGH (ref 70–99)
Glucose-Capillary: 135 mg/dL — ABNORMAL HIGH (ref 70–99)
Glucose-Capillary: 127 mg/dL — ABNORMAL HIGH (ref 70–99)

## 2022-03-06 LAB — PROTIME-INR
INR: 1 (ref 0.8–1.2)
Prothrombin Time: 13 seconds (ref 11.4–15.2)

## 2022-03-06 LAB — PHOSPHORUS: Phosphorus: 2.8 mg/dL (ref 2.5–4.6)

## 2022-03-06 LAB — BLOOD CULTURE ID PANEL (REFLEXED) - BCID2
A.calcoaceticus-baumannii: NOT DETECTED
Bacteroides fragilis: NOT DETECTED
Candida albicans: NOT DETECTED
Candida auris: NOT DETECTED
Candida glabrata: NOT DETECTED
Candida krusei: NOT DETECTED
Candida parapsilosis: NOT DETECTED
Candida tropicalis: NOT DETECTED
Cryptococcus neoformans/gattii: NOT DETECTED
Enterobacter cloacae complex: NOT DETECTED
Enterobacterales: NOT DETECTED
Enterococcus Faecium: NOT DETECTED
Enterococcus faecalis: DETECTED — AB
Escherichia coli: NOT DETECTED
Haemophilus influenzae: NOT DETECTED
Klebsiella aerogenes: NOT DETECTED
Klebsiella oxytoca: NOT DETECTED
Klebsiella pneumoniae: NOT DETECTED
Listeria monocytogenes: NOT DETECTED
Neisseria meningitidis: NOT DETECTED
Proteus species: NOT DETECTED
Pseudomonas aeruginosa: NOT DETECTED
Salmonella species: NOT DETECTED
Serratia marcescens: NOT DETECTED
Staphylococcus aureus (BCID): NOT DETECTED
Staphylococcus epidermidis: NOT DETECTED
Staphylococcus lugdunensis: NOT DETECTED
Staphylococcus species: DETECTED — AB
Stenotrophomonas maltophilia: NOT DETECTED
Streptococcus agalactiae: NOT DETECTED
Streptococcus pneumoniae: NOT DETECTED
Streptococcus pyogenes: NOT DETECTED
Streptococcus species: NOT DETECTED
Vancomycin resistance: NOT DETECTED

## 2022-03-06 LAB — POCT I-STAT 7, (LYTES, BLD GAS, ICA,H+H)
Acid-base deficit: 5 mmol/L — ABNORMAL HIGH (ref 0.0–2.0)
Bicarbonate: 21.3 mmol/L (ref 20.0–28.0)
Calcium, Ion: 1.3 mmol/L (ref 1.15–1.40)
HCT: 32 % — ABNORMAL LOW (ref 36.0–46.0)
Hemoglobin: 10.9 g/dL — ABNORMAL LOW (ref 12.0–15.0)
O2 Saturation: 100 %
Patient temperature: 37.6
Potassium: 2.9 mmol/L — ABNORMAL LOW (ref 3.5–5.1)
Sodium: 147 mmol/L — ABNORMAL HIGH (ref 135–145)
TCO2: 23 mmol/L (ref 22–32)
pCO2 arterial: 42.7 mmHg (ref 32–48)
pH, Arterial: 7.31 — ABNORMAL LOW (ref 7.35–7.45)
pO2, Arterial: 213 mmHg — ABNORMAL HIGH (ref 83–108)

## 2022-03-06 LAB — I-STAT VENOUS BLOOD GAS, ED
Acid-base deficit: 6 mmol/L — ABNORMAL HIGH (ref 0.0–2.0)
Bicarbonate: 20.1 mmol/L (ref 20.0–28.0)
Calcium, Ion: 1.23 mmol/L (ref 1.15–1.40)
HCT: 35 % — ABNORMAL LOW (ref 36.0–46.0)
Hemoglobin: 11.9 g/dL — ABNORMAL LOW (ref 12.0–15.0)
O2 Saturation: 93 %
Potassium: 4.1 mmol/L (ref 3.5–5.1)
Sodium: 147 mmol/L — ABNORMAL HIGH (ref 135–145)
TCO2: 21 mmol/L — ABNORMAL LOW (ref 22–32)
pCO2, Ven: 43.2 mmHg — ABNORMAL LOW (ref 44–60)
pH, Ven: 7.277 (ref 7.25–7.43)
pO2, Ven: 77 mmHg — ABNORMAL HIGH (ref 32–45)

## 2022-03-06 LAB — COMPREHENSIVE METABOLIC PANEL
ALT: 14 U/L (ref 0–44)
AST: 24 U/L (ref 15–41)
Albumin: 2.8 g/dL — ABNORMAL LOW (ref 3.5–5.0)
Alkaline Phosphatase: 73 U/L (ref 38–126)
Anion gap: 14 (ref 5–15)
BUN: 21 mg/dL — ABNORMAL HIGH (ref 6–20)
CO2: 19 mmol/L — ABNORMAL LOW (ref 22–32)
Calcium: 9.2 mg/dL (ref 8.9–10.3)
Chloride: 114 mmol/L — ABNORMAL HIGH (ref 98–111)
Creatinine, Ser: 1.29 mg/dL — ABNORMAL HIGH (ref 0.44–1.00)
GFR, Estimated: 48 mL/min — ABNORMAL LOW (ref >60)
Glucose, Bld: 105 mg/dL — ABNORMAL HIGH (ref 70–99)
Potassium: 4.1 mmol/L (ref 3.5–5.1)
Sodium: 147 mmol/L — ABNORMAL HIGH (ref 135–145)
Total Bilirubin: 0.5 mg/dL (ref 0.3–1.2)
Total Protein: 6.9 g/dL (ref 6.5–8.1)

## 2022-03-06 LAB — CK: Total CK: 84 U/L (ref 38–234)

## 2022-03-06 LAB — GLUCOSE, CAPILLARY
Glucose-Capillary: 119 mg/dL — ABNORMAL HIGH (ref 70–99)
Glucose-Capillary: 100 mg/dL — ABNORMAL HIGH (ref 70–99)

## 2022-03-06 LAB — OSMOLALITY: Osmolality: 313 mOsm/kg — ABNORMAL HIGH (ref 275–295)

## 2022-03-06 LAB — MAGNESIUM
Magnesium: 1.9 mg/dL (ref 1.7–2.4)
Magnesium: 2 mg/dL (ref 1.7–2.4)

## 2022-03-06 LAB — AMMONIA: Ammonia: 29 umol/L (ref 9–35)

## 2022-03-06 LAB — SALICYLATE LEVEL: Salicylate Lvl: <7 mg/dL — ABNORMAL LOW (ref 7.0–30.0)

## 2022-03-06 MED ORDER — FOLIC ACID 1 MG PO TABS: 1.0000 mg | ORAL_TABLET | Freq: Every day | ORAL | Status: DC

## 2022-03-06 MED ORDER — THIAMINE HCL 100 MG/ML IJ SOLN
100.0000 mg | Freq: Every day | INTRAMUSCULAR | Status: DC
  Administered 2022-03-06: 100 mg via INTRAVENOUS
  Filled 2022-03-06: qty 2

## 2022-03-06 MED ORDER — FENTANYL CITRATE (PF) 100 MCG/2ML IJ SOLN: 50.0000 ug | Freq: Once | INTRAMUSCULAR | Status: DC

## 2022-03-06 MED ORDER — HALOPERIDOL LACTATE 5 MG/ML IJ SOLN
5.0000 mg | Freq: Once | INTRAMUSCULAR | Status: AC
  Administered 2022-03-06: 5 mg via INTRAVENOUS
  Filled 2022-03-06: qty 1

## 2022-03-06 MED ORDER — CHLORHEXIDINE GLUCONATE 0.12% ORAL RINSE (MEDLINE KIT)
15.0000 mL | Freq: Two times a day (BID) | OROMUCOSAL | Status: DC
  Administered 2022-03-06 – 2022-03-15 (×17): 15 mL via OROMUCOSAL

## 2022-03-06 MED ORDER — LORAZEPAM 2 MG/ML IJ SOLN
1.0000 mg | INTRAMUSCULAR | Status: DC | PRN
  Administered 2022-03-06 (×2): 1 mg via INTRAVENOUS
  Filled 2022-03-06: qty 1

## 2022-03-06 MED ORDER — THIAMINE HCL 100 MG PO TABS
100.0000 mg | ORAL_TABLET | Freq: Every day | ORAL | Status: DC
  Administered 2022-03-07: 100 mg via ORAL
  Filled 2022-03-06: qty 1

## 2022-03-06 MED ORDER — LORAZEPAM 2 MG/ML IJ SOLN: 0.0000 mg | Freq: Three times a day (TID) | INTRAMUSCULAR | Status: DC

## 2022-03-06 MED ORDER — PANTOPRAZOLE 2 MG/ML SUSPENSION
40.0000 mg | Freq: Every day | ORAL | Status: DC
  Administered 2022-03-06 – 2022-03-08 (×3): 40 mg
  Filled 2022-03-06 (×4): qty 20

## 2022-03-06 MED ORDER — FENTANYL BOLUS VIA INFUSION
50.0000 ug | INTRAVENOUS | Status: DC | PRN
  Filled 2022-03-06: qty 100

## 2022-03-06 MED ORDER — DOCUSATE SODIUM 50 MG/5ML PO LIQD
100.0000 mg | Freq: Two times a day (BID) | ORAL | Status: DC
  Administered 2022-03-07 – 2022-03-08 (×2): 100 mg
  Filled 2022-03-06 (×2): qty 10

## 2022-03-06 MED ORDER — POLYETHYLENE GLYCOL 3350 17 G PO PACK
17.0000 g | PACK | Freq: Every day | ORAL | Status: DC
  Administered 2022-03-06 – 2022-03-08 (×3): 17 g
  Filled 2022-03-06 (×3): qty 1

## 2022-03-06 MED ORDER — FENTANYL CITRATE (PF) 100 MCG/2ML IJ SOLN
INTRAMUSCULAR | Status: AC
  Filled 2022-03-06: qty 2

## 2022-03-06 MED ORDER — OXYMETAZOLINE HCL 0.05 % NA SOLN: 1.0000 | Freq: Two times a day (BID) | NASAL | Status: AC | PRN

## 2022-03-06 MED ORDER — MIDAZOLAM HCL 2 MG/2ML IJ SOLN
INTRAMUSCULAR | Status: AC
  Filled 2022-03-06: qty 6

## 2022-03-06 MED ORDER — HALOPERIDOL LACTATE 5 MG/ML IJ SOLN: 5.0000 mg | Freq: Four times a day (QID) | INTRAMUSCULAR | Status: DC | PRN

## 2022-03-06 MED ORDER — DEXTROSE-NACL 5-0.45 % IV SOLN: INTRAVENOUS | Status: DC

## 2022-03-06 MED ORDER — LORAZEPAM 2 MG/ML IJ SOLN
2.0000 mg | Freq: Once | INTRAMUSCULAR | Status: AC
  Administered 2022-03-06: 2 mg via INTRAVENOUS
  Filled 2022-03-06: qty 1

## 2022-03-06 MED ORDER — ZIPRASIDONE MESYLATE 20 MG IM SOLR
10.0000 mg | Freq: Once | INTRAMUSCULAR | Status: AC
  Administered 2022-03-06: 10 mg via INTRAMUSCULAR
  Filled 2022-03-06: qty 20

## 2022-03-06 MED ORDER — LORAZEPAM 2 MG/ML IJ SOLN
0.0000 mg | INTRAMUSCULAR | Status: DC
  Administered 2022-03-06 (×2): 3 mg via INTRAVENOUS
  Filled 2022-03-06 (×2): qty 2

## 2022-03-06 MED ORDER — PHENOBARBITAL SODIUM 65 MG/ML IJ SOLN: 65.0000 mg | Freq: Three times a day (TID) | INTRAMUSCULAR | Status: DC

## 2022-03-06 MED ORDER — ORAL CARE MOUTH RINSE
15.0000 mL | OROMUCOSAL | Status: DC
  Administered 2022-03-06 – 2022-03-10 (×31): 15 mL via OROMUCOSAL

## 2022-03-06 MED ORDER — LABETALOL HCL 5 MG/ML IV SOLN
10.0000 mg | Freq: Once | INTRAVENOUS | Status: AC
  Administered 2022-03-06: 10 mg via INTRAVENOUS
  Filled 2022-03-06: qty 4

## 2022-03-06 MED ORDER — NOREPINEPHRINE 4 MG/250ML-% IV SOLN: 2.0000 ug/min | INTRAVENOUS | Status: DC

## 2022-03-06 MED ORDER — PHENOBARBITAL SODIUM 65 MG/ML IJ SOLN: 32.5000 mg | Freq: Three times a day (TID) | INTRAMUSCULAR | Status: DC

## 2022-03-06 MED ORDER — IOHEXOL 300 MG/ML  SOLN
80.0000 mL | Freq: Once | INTRAMUSCULAR | Status: AC | PRN
  Administered 2022-03-06: 80 mL via INTRAVENOUS

## 2022-03-06 MED ORDER — LABETALOL HCL 5 MG/ML IV SOLN: 10.0000 mg | INTRAVENOUS | Status: DC | PRN

## 2022-03-06 MED ORDER — SODIUM CHLORIDE 0.9 % IV SOLN
250.0000 mL | INTRAVENOUS | Status: DC
  Administered 2022-03-06: 250 mL via INTRAVENOUS

## 2022-03-06 MED ORDER — LORAZEPAM 1 MG PO TABS: 1.0000 mg | ORAL_TABLET | ORAL | Status: DC | PRN

## 2022-03-06 MED ORDER — CLONIDINE HCL 0.1 MG/24HR TD PTWK
0.1000 mg | MEDICATED_PATCH | TRANSDERMAL | Status: DC
  Administered 2022-03-06 – 2022-03-13 (×2): 0.1 mg via TRANSDERMAL
  Filled 2022-03-06 (×2): qty 1

## 2022-03-06 MED ORDER — ROCURONIUM BROMIDE 10 MG/ML (PF) SYRINGE
PREFILLED_SYRINGE | INTRAVENOUS | Status: AC
  Filled 2022-03-06: qty 10

## 2022-03-06 MED ORDER — CHLORHEXIDINE GLUCONATE CLOTH 2 % EX PADS
6.0000 | MEDICATED_PAD | Freq: Every day | CUTANEOUS | Status: DC
  Administered 2022-03-06: 6 via TOPICAL

## 2022-03-06 MED ORDER — LACTATED RINGERS IV SOLN: INTRAVENOUS | Status: DC

## 2022-03-06 MED ORDER — LORAZEPAM 2 MG/ML IJ SOLN: 1.0000 mg | INTRAMUSCULAR | Status: DC | PRN

## 2022-03-06 MED ORDER — PHENOBARBITAL SODIUM 130 MG/ML IJ SOLN
97.5000 mg | Freq: Three times a day (TID) | INTRAMUSCULAR | Status: DC
  Filled 2022-03-06: qty 1

## 2022-03-06 MED ORDER — ADULT MULTIVITAMIN W/MINERALS CH: 1.0000 | ORAL_TABLET | Freq: Every day | ORAL | Status: DC

## 2022-03-06 MED ORDER — LORAZEPAM 2 MG/ML IJ SOLN
1.0000 mg | INTRAMUSCULAR | Status: DC | PRN
  Administered 2022-03-08: 1 mg via INTRAVENOUS
  Filled 2022-03-06: qty 1

## 2022-03-06 MED ORDER — STERILE WATER FOR INJECTION IJ SOLN
INTRAMUSCULAR | Status: AC
  Filled 2022-03-06: qty 10

## 2022-03-06 MED ORDER — HALOPERIDOL LACTATE 5 MG/ML IJ SOLN: 2.0000 mg | Freq: Once | INTRAMUSCULAR | Status: DC

## 2022-03-06 MED ORDER — PROPOFOL 1000 MG/100ML IV EMUL
0.0000 ug/kg/min | INTRAVENOUS | Status: DC
  Administered 2022-03-06: 5 ug/kg/min via INTRAVENOUS
  Administered 2022-03-06: 40 ug/kg/min via INTRAVENOUS
  Administered 2022-03-07: 5 ug/kg/min via INTRAVENOUS
  Administered 2022-03-07 – 2022-03-08 (×2): 15 ug/kg/min via INTRAVENOUS
  Filled 2022-03-06 (×5): qty 100

## 2022-03-06 MED ORDER — FENTANYL 2500MCG IN NS 250ML (10MCG/ML) PREMIX INFUSION
50.0000 ug/h | INTRAVENOUS | Status: DC
  Administered 2022-03-06: 50 ug/h via INTRAVENOUS
  Filled 2022-03-06: qty 250

## 2022-03-06 MED ORDER — HYDRALAZINE HCL 20 MG/ML IJ SOLN
10.0000 mg | Freq: Four times a day (QID) | INTRAMUSCULAR | Status: DC | PRN
  Administered 2022-03-06: 10 mg via INTRAVENOUS
  Filled 2022-03-06: qty 1

## 2022-03-06 MED ORDER — SODIUM CHLORIDE 0.9 % IV SOLN
1.0000 g | INTRAVENOUS | Status: DC
  Administered 2022-03-06: 1 g via INTRAVENOUS
  Filled 2022-03-06: qty 10

## 2022-03-06 MED ORDER — SODIUM CHLORIDE 0.9 % IV SOLN
1.0000 g | Freq: Two times a day (BID) | INTRAVENOUS | Status: DC
  Administered 2022-03-06 – 2022-03-07 (×2): 1 g via INTRAVENOUS
  Filled 2022-03-06 (×2): qty 20

## 2022-03-06 MED ORDER — ADULT MULTIVITAMIN W/MINERALS CH
1.0000 | ORAL_TABLET | Freq: Every day | ORAL | Status: DC
  Administered 2022-03-07: 1 via ORAL
  Filled 2022-03-06 (×2): qty 1

## 2022-03-06 MED ORDER — FOLIC ACID 1 MG PO TABS
1.0000 mg | ORAL_TABLET | Freq: Every day | ORAL | Status: DC
  Administered 2022-03-07: 1 mg via ORAL
  Filled 2022-03-06 (×2): qty 1

## 2022-03-06 MED ORDER — ETOMIDATE 2 MG/ML IV SOLN
INTRAVENOUS | Status: AC
  Filled 2022-03-06: qty 10

## 2022-03-06 NOTE — Assessment & Plan Note (Signed)
?#)   Essential Hypertension: documented h/o such, with outpatient antihypertensive regimen appearing to include lisinopril.  In the setting of current n.p.o. status as well as suspected AKI , will home lisinopril for now.  SBP's in the ED today: Most recently in the 130s to 150s mmHg. ? ? ?Plan: Close monitoring of subsequent BP via routine VS. Hold home ace-I, as above.  ? ? ?

## 2022-03-06 NOTE — Progress Notes (Signed)
Update:  ? ?I was notified by RN that this patient is agitated, pulling at their telemetry leads, peripheral IV, and foley catheter, with these behaviors refractory to attempts at verbal redirection as well as previously administered doses of ativan and haldol.  In the setting of associated interference with ongoing medical treatment posing potential harm to themself, I have placed orders for soft bilateral wrist restraints. I have also placed order for prn IV ativan, preferring this option relative to additional haldol given cocaine positive uds finding.  ? ? ? ?Sherry Bertin, DO ?Hospitalist ? ?

## 2022-03-06 NOTE — ED Notes (Signed)
Ct called at this time. Pt medicated to go to ct. Waiting for ct at this time. Pt is resting quietly with NAD noted, resp equal and non labored, side rails up x 2. ?

## 2022-03-06 NOTE — ED Notes (Signed)
Patel MD at bedside.

## 2022-03-06 NOTE — Assessment & Plan Note (Signed)
? ?#(  anion gap metabolic acidosis: as reflected by presenting CMP.  Appears to be multifactorial in nature, with contribution suspected from cocaine + uds result as well as contribution from suspected acute kidney injury as well as element of dehydration.  Suspected cocaine use, also check CPK level to evaluate for secondary contribution from rhabdomyolysis.  Checking INR to evaluate hepatic synthetic function.  Will patient does appear to have a urinary tract infection, criteria not met for sepsis, and presenting lactate nonelevated at 1.7.  There also appears to be an element of starvation due to lactic acidosis, with presenting urinalysis notable for 20 ketones, concomitant with hypoglycemic findings. ? ?Plan: Check INR, add on salicylate level.  Follow-up results serum ethanol level.  Add on CPK level.  Pete CMP in the morning.  Check VBG.  IV fluids, as above.  Monitor strict I's and O's Daily weights.  Further evaluation management of acute kidney injury.  ? ?

## 2022-03-06 NOTE — Procedures (Signed)
Intubation Procedure Note ? ?Diva Odom  ?[id number]  ?08/13/1960 ? ?Date:03/06/22  ?Time:4:21 PM  ? ?Provider Performing:Evella Kasal D Rollene Rotunda  ? ? ?Procedure: Intubation ([id number]) ? ?Indication(s) ?Respiratory Failure ? ?Consent ?Unable to obtain consent due to emergent nature of procedure. ? ? ?Anesthesia ?Etomidate, Versed, Fentanyl, and Rocuronium ? ? ?Time Out ?Verified patient identification, verified procedure, site/side was marked, verified correct patient position, special equipment/implants available, medications/allergies/relevant history reviewed, required imaging and test results available. ? ? ?Sterile Technique ?Usual hand hygeine, masks, and gloves were used ? ? ?Procedure Description ?Patient positioned in bed supine.  Sedation given as noted above.  Patient was intubated with endotracheal tube using Glidescope.  View was Grade 1 full glottis .  Number of attempts was 1.  Colorimetric CO2 detector was consistent with tracheal placement. ? ? ?Complications/Tolerance ?None; patient tolerated the procedure well. ?Chest X-ray is ordered to verify placement. ? ? ?EBL ?Minimal ? ? ?Specimen(s) ?None ? ?Mikki Harbor, PA-C ?[facility] ?03/06/2022, 4:22 PM ? ?Please see [email or web address] for pager details. ? ?From 7A-7P if no response, please call [phone]. ?After hours, please call ELink [phone]. ? ? ?

## 2022-03-06 NOTE — Progress Notes (Signed)
PHARMACY - PHYSICIAN COMMUNICATION ?CRITICAL VALUE ALERT - BLOOD CULTURE IDENTIFICATION (BCID) ? ?Sherry Baker is an 56 y.o. female who presented to [facility] on 03/05/2022 with a chief complaint of AMS. ? ?Assessment:  56 yo F admitted with AMS, c/f impending respiratory failure and severe sepsis. Urinalysis from 5/3 is positive for small leukocytes, rare bacteria, >50 WBC and is nitrite negative. Due to pt's AMS, unable to assess for symptoms. Blood cultures obtained on 5/3 are reporting 1/4 bottles (aerobic) growing E. Faecalis and Streptococcus species. Pt received one dose of Ceftriaxone 1g in the ED at 01:01 on 03/06/22.  ? ?Name of physician (or Provider) Contacted: Dr. Silas Flood ? ?Current antibiotics: Ceftriaxone 1g q24h  ? ?Changes to prescribed antibiotics recommended:  ?Discussed recent hospitalizations (last 4/2-4/13) and concern for worsening clinical picture despite ceftriaxone administration. Ceftriaxone does not cover Enterococcus. Decision was made to discontinue ceftriaxone and initiate meropenem 1g q12h to cover streptococcus and enterococcus. If enterococcus is susceptible to ampicillin, can de-escalate further. However, Dr. Silas Flood wanted to provide more broad coverage until pt is more clinically stable.  ? ?Results for orders placed or performed during the hospital encounter of 03/05/22  ?Blood Culture ID Panel (Reflexed) (Collected: 03/05/2022  9:25 PM)  ?Result Value Ref Range  ? Enterococcus faecalis DETECTED (A) NOT DETECTED  ? Enterococcus Faecium NOT DETECTED NOT DETECTED  ? Listeria monocytogenes NOT DETECTED NOT DETECTED  ? Staphylococcus species DETECTED (A) NOT DETECTED  ? Staphylococcus aureus (BCID) NOT DETECTED NOT DETECTED  ? Staphylococcus epidermidis NOT DETECTED NOT DETECTED  ? Staphylococcus lugdunensis NOT DETECTED NOT DETECTED  ? Streptococcus species NOT DETECTED NOT DETECTED  ? Streptococcus agalactiae NOT DETECTED NOT DETECTED  ? Streptococcus pneumoniae NOT DETECTED NOT  DETECTED  ? Streptococcus pyogenes NOT DETECTED NOT DETECTED  ? A.calcoaceticus-baumannii NOT DETECTED NOT DETECTED  ? Bacteroides fragilis NOT DETECTED NOT DETECTED  ? Enterobacterales NOT DETECTED NOT DETECTED  ? Enterobacter cloacae complex NOT DETECTED NOT DETECTED  ? Escherichia coli NOT DETECTED NOT DETECTED  ? Klebsiella aerogenes NOT DETECTED NOT DETECTED  ? Klebsiella oxytoca NOT DETECTED NOT DETECTED  ? Klebsiella pneumoniae NOT DETECTED NOT DETECTED  ? Proteus species NOT DETECTED NOT DETECTED  ? Salmonella species NOT DETECTED NOT DETECTED  ? Serratia marcescens NOT DETECTED NOT DETECTED  ? Haemophilus influenzae NOT DETECTED NOT DETECTED  ? Neisseria meningitidis NOT DETECTED NOT DETECTED  ? Pseudomonas aeruginosa NOT DETECTED NOT DETECTED  ? Stenotrophomonas maltophilia NOT DETECTED NOT DETECTED  ? Candida albicans NOT DETECTED NOT DETECTED  ? Candida auris NOT DETECTED NOT DETECTED  ? Candida glabrata NOT DETECTED NOT DETECTED  ? Candida krusei NOT DETECTED NOT DETECTED  ? Candida parapsilosis NOT DETECTED NOT DETECTED  ? Candida tropicalis NOT DETECTED NOT DETECTED  ? Cryptococcus neoformans/gattii NOT DETECTED NOT DETECTED  ? Vancomycin resistance NOT DETECTED NOT DETECTED  ? ? ?[name] ?03/06/2022  4:35 PM ? ?

## 2022-03-06 NOTE — ED Notes (Addendum)
RN messaged CCM NP regarding pt's current GCS of 7 prior to phenobarbital administration. Verbal order to hold off on phenobarbital until pt gets up to the ICU. MD informed of pt's RR rate and pattern. RN placed pt on 6L via nasal cannula for comfort. ?

## 2022-03-06 NOTE — Progress Notes (Signed)
Pharmacy Antibiotic Note ? ?Sherry Baker is a 56 y.o. female admitted on 03/05/2022 with bacteremia.  Pharmacy has been consulted for meropenem dosing. ? ?WBC 6.1 ?Tmax 101.2 ?SCr 1.29, trending down ?LA 1.7 ? ?Lab called and reported BCID result of 1/4 bottles (aerobic) positive for E. faecalis and Streptococcus species.  ? ?Plan: ?BCID results were discussed with Dr. Silas Flood and the patient's antibiotics will be changed to meropenem. Further narrowing will be determined based on finalized susceptibilities. ?Discontinue Ceftriaxone ?Initiate Meropenem 1g q12h  ?Monitor CBC, SCr, temperature, and s/sx of infection daily ?Continue to follow cultures for sensitivities ? ?Height: '5\' 2"'$  (157.5 cm) ?Weight: 45.2 kg (99 lb 10.4 oz) ?IBW/kg (Calculated) : 50.1 ? ?Temp (24hrs), Avg:99.7 ?F (37.6 ?C), Min:97.4 ?F (36.3 ?C), Max:101.2 ?F (38.4 ?C) ? ?Recent Labs  ?Lab 03/05/22 ?2135 03/06/22 ?0409  ?WBC 6.2 6.1  ?CREATININE 1.46* 1.29*  ?LATICACIDVEN 1.7  --   ?  ?Estimated Creatinine Clearance: 33.5 mL/min (A) (by C-G formula based on SCr of 1.29 mg/dL (H)).   ? ?Not on File ? ?Antimicrobials this admission: ?Ceftriaxone 5/4 x1 ?Meropenem 5/4 >>  ? ?Dose adjustments this admission: ?N/A ? ?Microbiology results: ?5/3 BCx: 1/4 (aerobic) E. faecalis + Streptococcus ? ?Thank you for allowing pharmacy to be a part of this patient?s care. ? ?[name] ?03/06/2022 6:49 PM ? ?

## 2022-03-06 NOTE — Assessment & Plan Note (Signed)
? ?#)   Acute encephalopathy: Reportedly 1 day of progressive altered mental status associated with confusion, diminished responsiveness.  Suspect that this is multifactorial in nature, with attribution from UDS revealing positive cocaine finding as well as potential infectious contribution from urinary tract infection, in addition to multiple potential contributory electrolyte abnormalities, including hypoglycemia, mild hypernatremia, hypercalcemia.  There may also be a contribution from acute kidney injury, resulting in diminished renal clearance of the patient's reported use of scheduled opioids at home, although no prior serum creatinine data point available at this time to establish baseline and relative nature of her presenting serum creatinine finding.  There also appears to be an element of dehydration.  No evidence of additional underlying infectious process at this time, including chest x-ray showed no evidence of acute cardiopulmonary process.  However, CT chest, abdomen, pelvis, or ordered by EDP, with associated results currently pending. ? ?Will differential includes acute ischemic CVA, this possibility appears less likely at this time, as the patient is noted to be spontaneously moving all 4 extremities.  Presenting CT head showed no evidence of acute intracranial process, including no evidence of intracranial hemorrhage.  No overt evidence of underlying seizure-like activity. ? ? Will check VBG to evaluate for any contribution from hypercapnic encephalopathy. Will keep patient NPO until mental status improves sufficiently that patient is able to participate in and pass nursing bedside swallow screen.  Of note, TSH found to be within normal limits today. ? ? ? ?Plan: fall precautions. Repeat CMP/CBC in the AM. Check magnesium level. check VBG, MMA, INR, ammonia level, CPK. NPO, as described above.  Further evaluation management of suspected urinary tract infection, as further detailed below.  Follow-up  results of CT chest, abdomen, pelvis, as ordered by EDP.  Follow-up results of inpatient pharmacy reconciliation of outpatient medications.  Ionized calcium level.  Continuous lactated Ringer's.  Monitor strict I's and O's and daily weights.  Serial Accu-Cheks with prn amp of D50, as further detailed below.  Every 4 hours neurochecks. ? ? ?

## 2022-03-06 NOTE — ED Notes (Signed)
Found pt almost out of bed. Flailing in the bed, moaning and groaning. Obtained morning labs at this time. Pt placed back in restraints at this time ?

## 2022-03-06 NOTE — Assessment & Plan Note (Signed)
? ? ?#)   Hypercalcemia: Presenting labs reflect serum calcium, corrected for mild hypoalbuminemia, to be 10.4.  Suspect element of dehydration, without overt pharmacologic contribution. Will pursue gentle IVF's, as above, with repeat calcium level in the morning, with consideration for further expansion of work-up if no ensuing improvement in serum calcium level following interval IVF administration. ?  ?  ?Plan: ivf's, as above.  Monitor strict I's&O's, daily weights.  CMP in the morning.  Ionized calcium.  Check serum Mg and Phos levels. ?  ?

## 2022-03-06 NOTE — ED Notes (Signed)
Spoke with Dr. Velia Meyer at this time reference CBG results and pt current status at this time ?

## 2022-03-06 NOTE — ED Notes (Addendum)
Pt's current CIWA 36. RN Joellyn Quails MD regarding administration of a second dose of '3mg'$  ativan. Pt agitated, pulling on restraints, cardiac monitor leads, gown, and IV line. Pt has no eye opening to voice or pain, unable to follow commands.  ?

## 2022-03-06 NOTE — ED Notes (Signed)
Returned from ct at this time ?

## 2022-03-06 NOTE — ED Notes (Signed)
CCM NP Harris at bedside. Plan to start phenobarbital for alcohol withdrawal and monitoring airway. ?

## 2022-03-06 NOTE — ED Notes (Signed)
Admit provider made aware that I had to put pt back in restraints at this time ?

## 2022-03-06 NOTE — ED Notes (Signed)
Entered room and pt was able to get right arm restraint lose. She has broke her foley securement device, removed her monitor cables, and removed her colostomy bag. Pt cleaned and changed at this time. Everything replaced. Admit provider made aware via message at this time ?

## 2022-03-06 NOTE — H&P (Signed)
?History and Physical  ? ? ?PLEASE NOTE THAT [employer]AGON DICTATION SOFTWARE WAS USED IN THE CONSTRUCTION OF THIS NOTE. ? ? ?Sherry Baker OHY:[id number] DOB: 08/13/1960 DOA: 03/05/2022 ? ?PCP: Pcp, No  ?Patient coming from: home  ? ?I have personally briefly reviewed patient's old medical records in [place] ? ?Chief Complaint: altered mental status ? ?HPI: Sherry Baker is a 56 y.o. female with medical history significant for chronic pain syndrome on chronic opioid therapy, acquired hypothyroidism, hypertension, colostomy, who is admitted to [facility] on 03/05/2022 with acute encephalopathy after presenting from home to [facility] ED for evaluation of altered mental status. ? ?In the setting of the patient's altered mental status, the following history is provided by EMS, my discussions with the EDP, and via chart review. ? ?At baseline, the patient is reportedly awake alert responsive able to ambulate without assistance at home.  However, over the last day, as demonstrated evidence of multiple ground-level falls at home, with specific mechanism of fall not entirely clear at this time.  Fall earlier in the day on 03/05/2022 prompted EMS to be called, and upon evaluation of the patient, the patient refused transport to the hospital for further evaluation and management thereof.  However, as the day progressed, the patient reportedly became more altered, less responsive, and EMS was subsequently called again to evaluate the patient, at which time the patient was then brought to [facility] emergency department for further evaluation management thereof.  Patient reportedly became combative in route to emergency department, prompting receipt of 5 mg of IV Versed via EMS in route to the ED. ? ?Unclear if she is on any blood thinners at home.  ? ?Per my discussions with the EDP, the patient has history of chronic pain syndrome on chronic opioid therapy, including scheduled opioids.  Per chart review, her medical history is  notable for acquired hypothyroidism for which she is on Synthroid.  There is also documentation of a history of hypertension, for which she at least was previously on lisinopril. ? ? ? ? ? ?ED Course:  ?Vital signs in the ED were notable for the following: Temperature max 97.4; initial heart rate 125, which decreased to 106 following interval administration of IV fluids, as further detailed below; blood pressure 135/74 -151/85; respiratory rate 15-24, oxygen saturation 97 to 100% on room air.  ? ?Labs were notable for the following: CMP notable for the following: Sodium 148, without any prior serum sodium data point available for point comparison, potassium 4.3, chloride 115, bicarbonate 17, anion gap 16, creatinine 1.46, glucose 66, calcium, corrected for mild hypoalbuminemia 10.4, albumin 3.0, with additional liver enzymes within normal limits.  Initial CBG 67, which increased to 164 following administration of an amp of D50; lactic acid 1.7.  CBC notable for white blood cell count 6200, hemoglobin of 9.8, platelet count 218.  TSH 1.984.  Urinalysis notable for greater than 50 white blood cells, no squamous epithelial cells, 100 protein and 20 ketones.  Urinary drug screen is positive for cocaine as well as benzodiazepines, but otherwise negative, including negative for opioids.  Serum ethanol level ordered, with result currently pending.  Blood cultures x2 were collected prior to initiation of IV antibiotics. ? ?Imaging and additional notable ED work-up: EKG showed sinus tachycardia with heart rate 19, normal intervals, no evidence of T wave changes, and less than 1 mm ST depression limited to V4, without any evidence of ST elevation.  Chest x-ray showed no evidence of acute cardiopulmonary  process.  Plain films of the pelvis were obtained however evaluation of such was limited by significant motion artifact, but appeared to show severe degenerative changes of the bilateral hips; CT head showed no evidence of  acute intracranial process, including no evidence of intracranial hemorrhage; CT cervical spine showed no evidence of acute cervical spine fracture or subluxation.  CT chest, abdomen, pelvis, ordered by EDP, with results currently pending. ? ?While in the ED, the following were administered: Amp of D50 IV x1, Ativan 1 mg IV x2 doses, Haldol 2 mg IV x1, normal saline x1 L bolus. ? ?Subsequently, the patient was admitted for further evaluation management of presenting acute encephalopathy, with presentation also notable for urinary tract infection, while presenting labs notable for hypoglycemia, hypernatremia, anion gap metabolic acidosis, hypercalcemia, and elevated creatinine, without any prior creatinine data point available for point comparison. ? ? ? ? ?Review of Systems: As per HPI otherwise 10 point review of systems negative.  ? ?Past Medical History:  ?Diagnosis Date  ? Acquired hypothyroidism   ? Chronic pain syndrome   ? HTN (hypertension)   ? ? ?Past Surgical History:  ?Procedure Laterality Date  ? Colostomy    ? ? ?Social History: ? has no history on file for tobacco use, alcohol use, and drug use. ? ? ?Not on File ? ?History reviewed. No pertinent family history. ? ?Family history reviewed and not pertinent  ? ? ?Prior to Admission medications   ?Not on File  ? ? ? ?Objective  ? ? ?Physical Exam: ?Vitals:  ? 03/05/22 2130 03/05/22 2200 03/05/22 2245 03/05/22 2300  ?BP: (!) 181/113 135/71 (!) 196/116 (!) 151/85  ?Pulse: (!) 125 98 (!) 118 (!) 106  ?Resp: 15 15 (!) 24 18  ?Temp:    (!) 97.4 ?F (36.3 ?C)  ?SpO2: 98% 100% 98% 97%  ? ? ?General: appears to be stated age; somnolent; unable to follow instructions; spontaneously moving all 4 extremities ?Skin: warm, dry, no rash ?Head:  AT/[place] ?Mouth:  Oral mucosa membranes appear dry, normal dentition ?Neck: supple; trachea midline ?Heart: Mildly tachycardic, but regular; did not appreciate any M/R/G ?Lungs: CTAB, did not appreciate any wheezes, rales, or  rhonchi ?Abdomen: + BS; soft, ND ?Vascular: 2+ pedal pulses b/l; 2+ radial pulses b/l ?Extremities: no peripheral edema, no muscle wasting ?Neuro: In the setting of the patient's current mental status and associated inability to follow instructions, unable to perform full neurologic exam at this time.  As such, assessment of strength, sensation, and cranial nerves is limited at this time. Patient noted to spontaneously move all 4 extremities. No tremors.  ? ? ? ?Labs on Admission: I have personally reviewed following labs and imaging studies ? ?CBC: ?Recent Labs  ?Lab 03/05/22 ?2135  ?WBC 6.2  ?NEUTROABS 4.8  ?HGB 11.8*  ?HCT 38.9  ?MCV 92.0  ?PLT 218  ? ?Basic Metabolic Panel: ?Recent Labs  ?Lab 03/05/22 ?2135  ?NA 148*  ?K 4.3  ?CL 115*  ?CO2 17*  ?GLUCOSE 66*  ?BUN 26*  ?CREATININE 1.46*  ?CALCIUM 9.6  ? ?GFR: ?CrCl cannot be calculated (Unknown ideal weight.). ?Liver Function Tests: ?Recent Labs  ?Lab 03/05/22 ?2135  ?AST 24  ?ALT 14  ?ALKPHOS 77  ?BILITOT 0.9  ?PROT 7.1  ?ALBUMIN 3.0*  ? ?No results for input(s): LIPASE, AMYLASE in the last 168 hours. ?No results for input(s): AMMONIA in the last 168 hours. ?Coagulation Profile: ?No results for input(s): INR, PROTIME in the last 168 hours. ?Cardiac Enzymes: ?  No results for input(s): CKTOTAL, CKMB, CKMBINDEX, TROPONINI in the last 168 hours. ?BNP (last 3 results) ?No results for input(s): PROBNP in the last 8760 hours. ?HbA1C: ?No results for input(s): HGBA1C in the last 72 hours. ?CBG: ?Recent Labs  ?Lab 03/05/22 ?2132 03/05/22 ?2254  ?GLUCAP 67* 164*  ? ?Lipid Profile: ?No results for input(s): CHOL, HDL, LDLCALC, TRIG, CHOLHDL, LDLDIRECT in the last 72 hours. ?Thyroid Function Tests: ?Recent Labs  ?  03/05/22 ?2135  ?TSH 1.984  ? ?Anemia Panel: ?No results for input(s): VITAMINB12, FOLATE, FERRITIN, TIBC, IRON, RETICCTPCT in the last 72 hours. ?Urine analysis: ?   ?Component Value Date/Time  ? [place] 03/05/2022 2253  ? APPEARANCEUR CLEAR 03/05/2022  2253  ? LABSPEC 1.014 03/05/2022 2253  ? PHURINE 7.0 03/05/2022 2253  ? GLUCOSEU 50 (A) 03/05/2022 2253  ? [place] NEGATIVE 03/05/2022 2253  ? [place] NEGATIVE 03/05/2022 2253  ? KETONESUR 20 (A) 03/05/2022 2253

## 2022-03-06 NOTE — Consult Note (Signed)
? ?NAME:  Sherry Baker, MRN:  [id number], DOB:  08/13/1960, LOS: 1 ?ADMISSION DATE:  03/05/2022, CONSULTATION DATE:  03/06/2022 ?REFERRING MD:  Dr. Posey Pronto, CHIEF COMPLAINT:  Agitation   ? ?History of Present Illness:  ?Sherry Baker is a 56 y.o. female with a PMH significant for hypothyroidism, chronic pain syndrome, and HTN who presented to the emergency department overnight 5/3 due to altered mental status.  Per chart review it appears patient has had multiple EMS calls to the resident prior to 5/3 for falls/altered mental status but often was alert enough to refuse transfer to hospital.  However evening of 5/3 patient had additional fall with worsening altered mental status that resulted in recall to EMS and eventual transfer to hospital.  Patient required 5 mg IV Versed with EMS secondary to agitation. ? ?Per chart review and history obtained from family patient has long history with struggle with substance abuse including crack cocaine, PCP, and heavy alcohol use.  Patient typically presents to ED towards the end of the month when finds have expired and this leads to withdrawal symptoms.  Family states it is unlikely that she is currently withdrawing only as she recently received her welfare check May 1. ? ?On ED evaluation this a.m. patient is seen mildly febrile with temperature 100.3, tachypneic with respiratory rate 25-30, tachycardia with heart rate 1 30-1 50, and mild hypertension.  Currently able to protect airway and on room air, however she has snoring respirations on assessment.  ? ?PCCM consulted midmorning of 5/4 for worsening agitation despite aggressive pharmacologic therapy   ? ?Pertinent  Medical History  ?Hypothyroidism ?Chronic pain syndrome ?Polysubstance abuse  ?HTN ? ?Significant Hospital Events: ?Including procedures, antibiotic start and stop dates in addition to other pertinent events   ?5/4 admitted for AMS and multiple falls  ? ?Interim History / Subjective:  ?As above  ? ?Objective   ?Blood  pressure (!) 166/81, pulse (!) 118, temperature 99.9 ?F (37.7 ?C), resp. rate (!) 31, SpO2 97 %. ?   ?   ? ?Intake/Output Summary (Last 24 hours) at 03/06/2022 1241 ?Last data filed at 03/06/2022 9983 ?Gross per 24 hour  ?Intake 1105.5 ml  ?Output 600 ml  ?Net 505.5 ml  ? ?There were no vitals filed for this visit. ? ?Examination: ?General: Acute on chronic ill appearing thin deconditioned middle aged female lying in be in NAD ?HEENT: ETT, MM pink/moist, PERRL,  ?Neuro: Encephalopathic  ?CV: s1s2 regular rate and rhythm, no murmur, rubs, or gallops,  ?PULM:  Rhonchus/snoring respirations slightly improved with chin thrust, on room air  ?GI: soft, bowel sounds active in all 4 quadrants, non-tender, non-distended ?Extremities: warm/dry, no edema  ?Skin: no rashes or lesions ? ?Resolved Hospital Problem list   ? ? ?Assessment & Plan:  ?Concern for impending respiratory failure ?-Extremely encephalopathic currently with increased respiratory rate and drive.  This coupled with accessory muscle use and snoring respirations is concerning for impending respiratory failure ?P: ?Admit to ICU for close monitoring of airway ?Cautious sedation to avoid oversedation ?Could consider bridge with BiPAP if respiratory decompensation occurs but would quickly intubate as she is not a good candidate for prolonged use ?Encourage pulmonary hygiene ?Empiric ceftriaxone ?Fall precautions  ? ?Altered mental status ?Long history of polysubstance abuse including crack cocaine and PCP ?-UDS positive for Cocaine on admit  ?EtOH abuse ?P: ?Maintain neuro protective measures; goal for eurothermia, euglycemia, eunatermia, normoxia, and PCO2 goal of 35-40 ?Nutrition and bowel regiment  ?Seizure precautions  ?Aspirations precautions  ?  Pharmacological management of withdrawal symptoms ?Continue four-point restraints ? ?Likely AKI ?-Creatinine 1.46, GFR 41 on arrival, no prior history to compare  ?Possible urinary tract infection ?-Urinalysis positive for  small leukocytes, and rare bacteria, negative nitrates.  Unable to assess if patient has current symptoms due to AMS ?P: ?Follow renal function  ?Monitor urine output ?Trend Bmet ?Avoid nephrotoxins ?Ensure adequate renal perfusion  ?Empiric ceftriaxone ?Follow urine culture ? ?Hypernatremia ?Hyperchloremia ?P: ?IV hydration  ?Trend Bmet  ? ?History of hypothyroidism ?P: ?Continue home Synthroid ? ? ?Best Practice (right click and "Reselect all SmartList Selections" daily)  ? ?Diet/type: NPO ?DVT prophylaxis: prophylactic heparin  ?GI prophylaxis: PPI ?Lines: N/A ?Foley:  Yes, and it is still needed ?Code Status:  full code ?Last date of multidisciplinary goals of care discussion: [place] discussion pending family discussion  ? ?Labs   ?CBC: ?Recent Labs  ?Lab 03/05/22 ?2135 03/06/22 ?0409 03/06/22 ?0427  ?WBC 6.2 6.1  --   ?NEUTROABS 4.8 3.7  --   ?HGB 11.8* 11.2* 11.9*  ?HCT 38.9 36.7 35.0*  ?MCV 92.0 90.2  --   ?PLT 218 229  --   ? ? ?Basic Metabolic Panel: ?Recent Labs  ?Lab 03/05/22 ?2135 03/06/22 ?0409 03/06/22 ?0427  ?NA 148* 147* 147*  ?K 4.3 4.1 4.1  ?CL 115* 114*  --   ?CO2 17* 19*  --   ?GLUCOSE 66* 105*  --   ?BUN 26* 21*  --   ?CREATININE 1.46* 1.29*  --   ?CALCIUM 9.6 9.2  --   ?MG  --  2.0  1.9  --   ?PHOS  --  2.8  --   ? ?GFR: ?CrCl cannot be calculated (Unknown ideal weight.). ?Recent Labs  ?Lab 03/05/22 ?2135 03/06/22 ?0409  ?WBC 6.2 6.1  ?LATICACIDVEN 1.7  --   ? ? ?Liver Function Tests: ?Recent Labs  ?Lab 03/05/22 ?2135 03/06/22 ?0409  ?AST 24 24  ?ALT 14 14  ?ALKPHOS 77 73  ?BILITOT 0.9 0.5  ?PROT 7.1 6.9  ?ALBUMIN 3.0* 2.8*  ? ?No results for input(s): LIPASE, AMYLASE in the last 168 hours. ?Recent Labs  ?Lab 03/06/22 ?0409  ?AMMONIA 29  ? ? ?ABG ?   ?Component Value Date/Time  ? HCO3 20.1 03/06/2022 0427  ? TCO2 21 (L) 03/06/2022 0427  ? ACIDBASEDEF 6.0 (H) 03/06/2022 0427  ? O2SAT 93 03/06/2022 0427  ?  ? ?Coagulation Profile: ?Recent Labs  ?Lab 03/06/22 ?0409  ?INR 1.0  ? ? ?Cardiac  Enzymes: ?Recent Labs  ?Lab 03/06/22 ?0409  ?CKTOTAL 84  ? ? ?HbA1C: ?No results found for: HGBA1C ? ?CBG: ?Recent Labs  ?Lab 03/06/22 ?0306 03/06/22 ?0418 03/06/22 ?0801 03/06/22 ?4193 03/06/22 ?1145  ?GLUCAP 123* 98 135* 130* 127*  ? ? ?Review of Systems:   ?Unable to assess  ? ?Past Medical History:  ?She,  has a past medical history of Acquired hypothyroidism, Chronic pain syndrome, and HTN (hypertension).  ? ?Surgical History:  ? ?Past Surgical History:  ?Procedure Laterality Date  ? Colostomy    ?  ? ?Social History:  ?   ? ?Family History:  ?Her family history is not on file.  ? ?Allergies ?Not on File  ? ?Home Medications  ?Prior to Admission medications   ?Not on File  ?  ? ?Critical care time:   ?CRITICAL CARE ?Performed by: Nathalya Wolanski D. Harris ? ?Total critical care time: 40 minutes ? ?Critical care time was exclusive of separately billable procedures and treating other patients. ? ?  Critical care was necessary to treat or prevent imminent or life-threatening deterioration. ? ?Critical care was time spent personally by me on the following activities: development of treatment plan with patient and/or surrogate as well as nursing, discussions with consultants, evaluation of patient's response to treatment, examination of patient, obtaining history from patient or surrogate, ordering and performing treatments and interventions, ordering and review of laboratory studies, ordering and review of radiographic studies, pulse oximetry and re-evaluation of patient's condition. ? ?Marcine Gadway D. Harris, NP-C ? Pulmonary & Critical Care ?Personal contact information can be found on Amion  ?03/06/2022, 1:51 PM ? ? ? ? ? ? ?

## 2022-03-06 NOTE — ED Notes (Signed)
RN messaged MD to come to bedside and evaluate pt. Pt has grunting/snoring respirations, unable to clear secretions on her own. NT and RN at bedside using suction. RT called for nasal suctioning. ?

## 2022-03-06 NOTE — ED Notes (Signed)
Returned from ct and pt is moaning and groaning, flailing all over the bed, trying to pull monitor off, pull PIV out, and trying to remove the foley. Spoke with ED provider and also sent message to admit provider ?

## 2022-03-06 NOTE — Assessment & Plan Note (Signed)
? ? ?#)   Elevated creatinine: Presenting serum creatinine noted to be 1.46, without any prior serum creatinine data points available for point comparison to further establish chronicity of this finding.  Element of acute kidney injury is suspected, particularly given presenting urinalysis result, which includes finding of 100 protein.  Suspect potential contributions from cocaine, as above, as well as suspected dehydration, with potential pharmacologic exacerbation in the setting of previous documentation of use of lisinopril at home.  We will also evaluate for rhabdomyolysis given the above history. ? ?Plan: IV fluids, as above.  Monitor strict I's and O's and daily weights.  Tempt avoid nephrotoxic agents.  Hold home lisinopril.  Repeat CMP in the morning.  Add on random urine sent as well as random urine creatinine.  Check CPK level. ? ? ?

## 2022-03-06 NOTE — ED Notes (Signed)
Pt's aunt, Alm Bustard, called nurse for an update. Per aunt, pt frequently uses fentanyl, cocaine and is a daily heavy drinker, she also states that whenever pt wakes up she will be violent and aggressive and mean to staff. ?

## 2022-03-06 NOTE — Progress Notes (Signed)
Pharmacy Phenobarbital Consult Note  ? ?Pharmacy Consult for IV Phenobarbital   ?Indication: Alcohol Withdrawal   ? ?Labs:  ?Lab Results  ?Component Value Date  ? CREATININE 1.29 (H) 03/06/2022  ? AST 24 03/06/2022  ? ALT 14 03/06/2022  ? ALKPHOS 73 03/06/2022  ? ? ?Nursing Bedside Screening:   ?Provider assessed as high risk ? ?Assessment: ?Sherry Baker is a 56 y.o. year old female admitted on 03/05/2022. Patients meets criteria for high risk dosing of phenobarbital per CCM provider. Pharmacy consulted to dose phenobarbital.   ? ?Plan: ?Start high risk taper IV: Phenobarbital 97.'5mg'$  IV push q 8h x 6 doses followed by Phenobarbital '65mg'$  IV push q 8h x 6 doses followed by Phenobarbital 32.'4mg'$  IV push q 8h x 6 doses ?Start Lorazepam '1mg'$  IV q4h prn agitation    ? ?Thank you for allowing pharmacy to participate in this patient's care. ? ?Lorelei Pont, PharmD, BCPS ?03/06/2022 2:09 PM ?ED Clinical Pharmacist -  [phone] ? ?

## 2022-03-06 NOTE — Assessment & Plan Note (Signed)
? ?#)   Hypernatremia: Serum sodium noted to be 148, without any prior serum sodium data points available for point comparison.  Suspect an element of dehydration given clinical suggestion of such.  However, patient's recent trauma, including multiple ground-level falls over the last day is also notable, although it is unclear if she hit her head as component of these falls.  CT head shows no evidence of acute intracranial process.  Per chart review, it does not appear that the patient is on lithium.  She has received a 1 L NS bolus in the ED. given concomitant hyperglycemia, changing IV fluids to D5 half NS at 125 cc/h, with close monitoring of ensuing serum sodium level, with potential for further change to D5W depending upon ensuing trend in renal function as well as hemodynamic status, as well as hypoglycemic trend.  Of note, TSH found to be within normal limits today. ? ?Plan: Monitor strict I's and O's and daily weights.  IV fluids, as above.  Add on regarding urine symptoms with primary creatinine.  Check random urine osmolality as well as serum osmolality.  Repeat CMP in the morning.  Every 4 hour neurochecks ordered.  Follow-up result of inpatient pharmacy outpatient med rec. ? ?

## 2022-03-06 NOTE — ED Notes (Signed)
Pt transported to ct with this RN at bedside ?

## 2022-03-06 NOTE — Progress Notes (Signed)
HOSPITAL MEDICINE OVERNIGHT EVENT NOTE   ? ?Notified by nursing that patient has been extremely agitated throughout the evening since hospitalization.  Patient has received multiple doses of sedating agents in an attempt to quell her agitation including '7mg'$  of Haldol and a total of 2 mg of Ativan. ? ?Patient continues to be extremely aggressive and agitated with repeated attempts to get out of bed.  Patient is not following commands and is frequently having to remove all medical devices and impeding nursing care. ? ?Admitting provider has additionally ordered four-point restraints however patient continues to struggle.  In an effort to protect the patient we will administer another 2 mg of intravenous Ativan now. ? ?Sherry Emerald  MD ?[facility]  ? ? ? ? ? ? ? ? ? ? ?

## 2022-03-06 NOTE — ED Notes (Signed)
Pt is continuing to attempt to flail in the bed, moaning and groaning. Message sent to provider at this time ?

## 2022-03-06 NOTE — Assessment & Plan Note (Signed)
? ?#)   Cocaine positive:  Presenting UDS notable for positive cocaine finding.  Unclear if she is on any prescribed or over-the-counter analogs that would have resulted in a false positive finding on this UDS.  ? ?Plan: Follow-up for result of a patient med rec via inpatient pharmacist.  Also please consult with transition of care team. ? ? ?

## 2022-03-06 NOTE — Assessment & Plan Note (Signed)
? ? ?#)   acquired hypothyroidism: documented h/o such; appears to be on Synthroid as outpatient.  TSH within normal limits when checked earlier today. ? ?Plan: In the setting of current n.p.o. status, will hold home Synthroid for now..  ? ? ?

## 2022-03-06 NOTE — Assessment & Plan Note (Signed)
? ? ? ?#)   Urinary tract infection: Suspected diagnosis on the basis of presenting urinalysis notable for greater than 50 white blood cells, in the absence of any squamous epithelial cells to suggest a contaminated specimen.  In the absence of objective fever or leukocytosis, SIRS criteria not currently met for sepsis.  Blood cultures x2 collected prior to initiation of Rocephin.  Of note, lactate nonelevated at 1.7. ? ?Plan: Continuous LR.  Follow-up results of blood cultures x2.  Add on urine culture.  Continue Rocephin.  Repeat CBC with differential in the morning. ? ?

## 2022-03-06 NOTE — Assessment & Plan Note (Signed)
? ?#)   Hypoglycemia: Presenting blood sugar noted to be 67 per her CBG, with transient improvement to 164 following amp of D50, with repeat blood sugar notable for interval decline to 55.  Per initial chart review, no documented history of diabetes, and it does not appear that the patient is on any insulin or sulfonylureas as an outpatient.  Given refractory nature of her hypoglycemia, with potential contributions to her presenting acute encephalopathy, will change IV fluids, as below. ? ?Plan: Every hour CBG checks x4 followed by every 6 hour CBG checks without orders for associated sliding scale coverage.  Prn amp of D50 for CBG less than 70.  Check hemoglobin A1c.  Start D5 half NS at 125 cc/hr. repeat CMP in the morning. ? ?

## 2022-03-06 NOTE — TOC Progression Note (Signed)
Transition of Care (TOC) - Progression Note  ? ? ?Patient Details  ?Name: Sherry Baker ?MRN: [id number] ?Date of Birth: 08/13/1960 ? ?Transition of Care (TOC) CM/SW Contact  ?Angelita Ingles, RN ?Phone Number:[phone] ? ?03/06/2022, 4:04 PM ? ?Clinical Narrative:    ? ?Transition of Care (TOC) Screening Note ? ? ?Patient Details  ?Name: Sherry Baker ?Date of Birth: 08/13/1960 ? ? ?Transition of Care (TOC) CM/SW Contact:    ?Angelita Ingles, RN ?Phone Number: ?03/06/2022, 4:05 PM ? ? ? ?Transition of Care Department [facility]) has reviewed patient and no TOC needs have been identified at this time. We will continue to monitor patient advancement through interdisciplinary progression rounds. If new patient transition needs arise, please place a TOC consult. ? ? ? ? ?  ?  ? ?Expected Discharge Plan and Services ?  ?  ?  ?  ?  ?                ?  ?  ?  ?  ?  ?  ?  ?  ?  ?  ? ? ?Social Determinants of Health (SDOH) Interventions ?  ? ?Readmission Risk Interventions ?   ? View : No data to display.  ?  ?  ?  ? ? ?

## 2022-03-06 NOTE — Progress Notes (Signed)
?  Progress Note ?Patient: Sherry Baker BSW:[id number] DOB: 08/13/1960 DOA: 03/05/2022  ?DOS: the patient was seen and examined on 03/06/2022 ? ?Brief hospital course: ?56 year old female with past medical history of chronic pain syndrome, substance abuse, colostomy, HTN, hypothyroidism, alcohol abuse presents to the hospital with confusion and recurrent falls. ? ?Assessment and Plan: ?Principal Problem: ?  Acute encephalopathy ?Reportedly have progressive confusion. ?UDS revealing for cocaine. ?Alcohol level is negative. ?Other etiology of metabolic encephalopathy were not identified based on the work-up. ?CT of the head was unremarkable. ?CT chest abdomen pelvis was also unremarkable. ?Less likely seizures. ?Patient continues to remain significantly agitated despite multiple rounds of IV Haldol, IV Ativan, as well as IM Geodon. ?PCCM was consulted and the patient was transferred to ICU for close monitoring and potential Precedex infusion. ? ?Active Problems: ?  UTI (urinary tract infection) ?Although nitrates are negative.  Currently on IV ceftriaxone. ?Follow-up on cultures. ? ?  Hypoglycemia ?Currently on D5 half-normal saline. ?Monitor CBG. ? ?  Hypernatremia ?Currently receiving half-normal saline. ?Likely from poor p.o. intake. ? ?  Cocaine abuse ([place]) ?Avoid selective beta-blockers. ?Clonidine patch ordered. ?IV labetalol as needed. ? ?  High anion gap metabolic acidosis ?Likely from AKI. ?Continue IV fluids. ? ?  AKI. ?Continue with IV fluids. ?Renal function improving already. ? ?  Alcohol abuse history. ?Currently on CIWA protocol. ?Monitor. ? ?Subjective: Patient is morning, withdrawing to painful stimuli, remains agitated trying to pull on IV lines and colostomy bag.  Unable to cooperate with exam. ? ?Physical Exam: ?Vitals:  ? 03/06/22 1510 03/06/22 1515 03/06/22 1530 03/06/22 1623  ?BP: (!) 159/97     ?Pulse: (!) 129 (!) 128 (!) 131   ?Resp: (!) 42 (!) 27 (!) 27   ?Temp: (!) 101 ?F (38.3 ?C) (!) 101.2 ?F  (38.4 ?C) (!) 101.2 ?F (38.4 ?C)   ?SpO2: 100% 100% 99% 99%  ?Weight:    45.2 kg  ?Height:    '5\' 2"'$  (1.575 m)  ? ?General: Appear in moderate distress; no visible Abnormal Neck Mass Or lumps, Conjunctiva normal ?Cardiovascular: S1 and S2 Present, no Murmur, ?Respiratory: increased respiratory effort, Bilateral Air entry present and CTA, no Crackles, no wheezes ?Abdomen: Bowel Sound present, difficult to assess tenderness ?Extremities: no Pedal edema, diffuse bruising at various stages of healing.  Cuts on the right arm and forehead. ?Neurology: lethargic and not oriented to time, place, and person ?Gait not checked due to patient safety concerns  ? ?Data Reviewed: ?I have Reviewed nursing notes, Vitals, and Lab results since pt's last encounter. Pertinent lab results CBC and CMP ?I have ordered test including CBC and BMP ?I have discussed pt's care plan and test results with PCCM.  ? ?Family Communication: None at bedside ? ?Disposition: ?Status is: Inpatient ?Remains inpatient appropriate because: Requiring close observation in the ICU to treat her ongoing encephalopathy. ? ?Author: ?Berle Mull, MD ?03/06/2022 5:36 PM ? ?Please look on [email or web address] to find out who is on call.  ?

## 2022-03-07 ENCOUNTER — Other Ambulatory Visit: Payer: Self-pay | Admitting: Student

## 2022-03-07 ENCOUNTER — Inpatient Hospital Stay (HOSPITAL_COMMUNITY): Payer: Medicaid Other

## 2022-03-07 DIAGNOSIS — R7881 Bacteremia: Secondary | ICD-10-CM | POA: Diagnosis not present

## 2022-03-07 DIAGNOSIS — G9340 Encephalopathy, unspecified: Secondary | ICD-10-CM | POA: Diagnosis not present

## 2022-03-07 LAB — BASIC METABOLIC PANEL
Anion gap: 6 (ref 5–15)
Anion gap: 5 (ref 5–15)
BUN: 13 mg/dL (ref 6–20)
BUN: 11 mg/dL (ref 6–20)
CO2: 20 mmol/L — ABNORMAL LOW (ref 22–32)
CO2: 17 mmol/L — ABNORMAL LOW (ref 22–32)
Calcium: 8.5 mg/dL — ABNORMAL LOW (ref 8.9–10.3)
Calcium: 8.4 mg/dL — ABNORMAL LOW (ref 8.9–10.3)
Chloride: 117 mmol/L — ABNORMAL HIGH (ref 98–111)
Chloride: 118 mmol/L — ABNORMAL HIGH (ref 98–111)
Creatinine, Ser: 1.3 mg/dL — ABNORMAL HIGH (ref 0.44–1.00)
Creatinine, Ser: 1.25 mg/dL — ABNORMAL HIGH (ref 0.44–1.00)
GFR, Estimated: 47 mL/min — ABNORMAL LOW (ref >60)
GFR, Estimated: 50 mL/min — ABNORMAL LOW (ref >60)
Glucose, Bld: 104 mg/dL — ABNORMAL HIGH (ref 70–99)
Glucose, Bld: 95 mg/dL (ref 70–99)
Potassium: 2.3 mmol/L — CL (ref 3.5–5.1)
Potassium: 4.5 mmol/L (ref 3.5–5.1)
Sodium: 143 mmol/L (ref 135–145)
Sodium: 140 mmol/L (ref 135–145)

## 2022-03-07 LAB — GLUCOSE, CAPILLARY
Glucose-Capillary: 111 mg/dL — ABNORMAL HIGH (ref 70–99)
Glucose-Capillary: 92 mg/dL (ref 70–99)
Glucose-Capillary: 98 mg/dL (ref 70–99)
Glucose-Capillary: 105 mg/dL — ABNORMAL HIGH (ref 70–99)
Glucose-Capillary: 112 mg/dL — ABNORMAL HIGH (ref 70–99)
Glucose-Capillary: 132 mg/dL — ABNORMAL HIGH (ref 70–99)

## 2022-03-07 LAB — CBC
HCT: 28.7 % — ABNORMAL LOW (ref 36.0–46.0)
Hemoglobin: 9.1 g/dL — ABNORMAL LOW (ref 12.0–15.0)
MCH: 27.7 pg (ref 26.0–34.0)
MCHC: 31.7 g/dL (ref 30.0–36.0)
MCV: 87.5 fL (ref 80.0–100.0)
Platelets: 193 10*3/uL (ref 150–400)
RBC: 3.28 MIL/uL — ABNORMAL LOW (ref 3.87–5.11)
RDW: 19.9 % — ABNORMAL HIGH (ref 11.5–15.5)
WBC: 4.3 10*3/uL (ref 4.0–10.5)
nRBC: 0 % (ref 0.0–0.2)

## 2022-03-07 LAB — TRIGLYCERIDES: Triglycerides: 135 mg/dL (ref <150)

## 2022-03-07 LAB — URINE CULTURE: Culture: NO GROWTH

## 2022-03-07 LAB — ECHOCARDIOGRAM COMPLETE
AV Peak grad: 6.3 mmHg
Ao pk vel: 1.26 m/s
Area-P 1/2: 3.91 cm2
Height: 62 in
S' Lateral: 4.1 cm
Weight: 1537.93 oz

## 2022-03-07 LAB — MRSA NEXT GEN BY PCR, NASAL: MRSA by PCR Next Gen: DETECTED — AB

## 2022-03-07 LAB — MAGNESIUM: Magnesium: 1.9 mg/dL (ref 1.7–2.4)

## 2022-03-07 MED ORDER — LACTATED RINGERS IV BOLUS
1000.0000 mL | Freq: Once | INTRAVENOUS | Status: AC
  Administered 2022-03-07: 1000 mL via INTRAVENOUS

## 2022-03-07 MED ORDER — SODIUM CHLORIDE 0.9% FLUSH: 10.0000 mL | INTRAVENOUS | Status: DC | PRN

## 2022-03-07 MED ORDER — ENOXAPARIN SODIUM 40 MG/0.4ML IJ SOSY
40.0000 mg | PREFILLED_SYRINGE | INTRAMUSCULAR | Status: DC
  Administered 2022-03-07: 40 mg via SUBCUTANEOUS
  Filled 2022-03-07: qty 0.4

## 2022-03-07 MED ORDER — DEXMEDETOMIDINE HCL IN NACL 400 MCG/100ML IV SOLN
0.4000 ug/kg/h | INTRAVENOUS | Status: DC
  Administered 2022-03-07: 1.2 ug/kg/h via INTRAVENOUS
  Administered 2022-03-07: 0.4 ug/kg/h via INTRAVENOUS
  Administered 2022-03-08: 0.8 ug/kg/h via INTRAVENOUS
  Administered 2022-03-08: 0.6 ug/kg/h via INTRAVENOUS
  Administered 2022-03-10: 1.2 ug/kg/h via INTRAVENOUS
  Administered 2022-03-10: 1.6 ug/kg/h via INTRAVENOUS
  Administered 2022-03-11: 0.9 ug/kg/h via INTRAVENOUS
  Administered 2022-03-11: 1.6 ug/kg/h via INTRAVENOUS
  Administered 2022-03-11: 1 ug/kg/h via INTRAVENOUS
  Administered 2022-03-11: 1.6 ug/kg/h via INTRAVENOUS
  Filled 2022-03-07 (×12): qty 100

## 2022-03-07 MED ORDER — ENOXAPARIN SODIUM 30 MG/0.3ML IJ SOSY
30.0000 mg | PREFILLED_SYRINGE | INTRAMUSCULAR | Status: DC
  Administered 2022-03-08 – 2022-03-15 (×8): 30 mg via SUBCUTANEOUS
  Filled 2022-03-07 (×8): qty 0.3

## 2022-03-07 MED ORDER — ARTIFICIAL TEARS OPHTHALMIC OINT
TOPICAL_OINTMENT | Freq: Two times a day (BID) | OPHTHALMIC | Status: DC
  Filled 2022-03-07 (×4): qty 3.5

## 2022-03-07 MED ORDER — SODIUM CHLORIDE 0.9% FLUSH
10.0000 mL | Freq: Two times a day (BID) | INTRAVENOUS | Status: DC
  Administered 2022-03-07 – 2022-03-15 (×16): 10 mL

## 2022-03-07 MED ORDER — SODIUM CHLORIDE 0.9 % IV SOLN
2.0000 g | Freq: Four times a day (QID) | INTRAVENOUS | Status: DC
  Administered 2022-03-07 – 2022-03-12 (×20): 2 g via INTRAVENOUS
  Filled 2022-03-07 (×22): qty 2000

## 2022-03-07 MED ORDER — MUPIROCIN 2 % EX OINT
1.0000 "application " | TOPICAL_OINTMENT | Freq: Two times a day (BID) | CUTANEOUS | Status: AC
  Administered 2022-03-07 – 2022-03-11 (×10): 1 via NASAL
  Filled 2022-03-07: qty 22

## 2022-03-07 MED ORDER — POTASSIUM CHLORIDE 20 MEQ PO PACK
40.0000 meq | PACK | ORAL | Status: AC
  Administered 2022-03-07 (×2): 40 meq
  Filled 2022-03-07 (×2): qty 2

## 2022-03-07 MED ORDER — CHLORHEXIDINE GLUCONATE CLOTH 2 % EX PADS
6.0000 | MEDICATED_PAD | Freq: Every day | CUTANEOUS | Status: AC
  Administered 2022-03-06 – 2022-03-11 (×6): 6 via TOPICAL

## 2022-03-07 MED ORDER — MEDIHONEY WOUND/BURN DRESSING EX PSTE
1.0000 "application " | PASTE | Freq: Every day | CUTANEOUS | Status: DC
  Administered 2022-03-07 – 2022-03-15 (×9): 1 via TOPICAL
  Filled 2022-03-07: qty 44

## 2022-03-07 MED ORDER — MAGNESIUM SULFATE IN D5W 1-5 GM/100ML-% IV SOLN
1.0000 g | Freq: Once | INTRAVENOUS | Status: AC
  Administered 2022-03-07: 1 g via INTRAVENOUS
  Filled 2022-03-07: qty 100

## 2022-03-07 NOTE — Consult Note (Signed)
WOC Nurse Consult Note: ?Patient receiving care in [place] 2M04 ?Two charts with conflicting DOB  ?MR # [id number] has last name as Shorkey and DOB 09/04/1966 with a photo. This chart is the location of previous [place] consults.  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 ?Pressure Injury POA: Yes ?Measurement: 6 cm x 1.8 cm x 1 cm ?Wound bed: pale pink with very small evidence of possible eschar. ?Drainage (amount, consistency, odor) scant amount of tan drainage on foam dressing. ?Periwound: macerated. ?Dressing procedure/placement/frequency: ?Cleanse the wound with NS. Pat dry then apply a nickel thick layer of MediHoney directly to the wound or onto a dressing. Make certain that the [facility] covers the entire wound base, but not in contact with the peri-wound skin. For deeper wounds apply a layer of the [place] into the wound and any tunneled or undermined area then cover with dry gauze and secure with foam dressing. Change dressing daily.  ? ?[place] Nurse ostomy consult note ?Upon entering the room the patient had a 4" pouch on upside down. ?Stoma type/location: LLQ colostomy ?Stomal assessment/size: 1 1/8" well budded parastomal hernia present ?Peristomal assessment: intact ?Treatment options for stomal/peristomal skin: barrier ring ?Output: thin brown ?Ostomy pouching: 2pc. 2 1/4" pouch Kellie Simmering # [phone]) Skin barrier Kellie Simmering # [phone]) Barrier rings Kellie Simmering # [phone]) ? ?Monitor the wound area(s) for worsening of condition such as: ?Signs/symptoms of infection, increase in size, development of or worsening of odor, ?development of pain, or increased pain at the affected locations.   ?Notify the medical team if any of these develop. ? ?Thank you for the consult. [place] nurse will not follow at this time.   ?Please re-consult the [place] team if needed. ? ?Cathlean [place]. Tamala Julian, MSN, RN, CMSRN, AGCNS, WTA ?Wound Treatment Associate ?Pager  [phone]   ? ? ? ? ? ?  ? ?  ?

## 2022-03-07 NOTE — Progress Notes (Signed)
eLink Physician-Brief Progress Note ?Patient Name: Sherry Baker ?DOB: 08/13/1960 ?MRN: [id number] ? ? ?Date of Service ? 03/07/2022  ?HPI/Events of Note ? Oliguria - Nursing reports urine output of 60 mL for this shift. No CVL or CVP.  ?eICU Interventions ? Plan: ?Bolus with LR 1 liter IV over 1 hour now.   ? ? ? ?Intervention Category ?Major Interventions: Other: ? ?Sherry Baker ?03/07/2022, 12:38 AM ?

## 2022-03-07 NOTE — Consult Note (Deleted)
? ?NAME:  Sherry Baker, MRN:  [id number], DOB:  08/13/1960, LOS: 2 ?ADMISSION DATE:  03/05/2022, CONSULTATION DATE:  03/06/2022 ?REFERRING MD:  Dr. Posey Pronto, CHIEF COMPLAINT:  Agitation   ? ?History of Present Illness:  ?Sherry Baker is a 56 y.o. female with a PMH significant for hypothyroidism, chronic pain syndrome, and HTN who presented to the emergency department overnight 5/3 due to altered mental status.  Per chart review it appears patient has had multiple EMS calls to the resident prior to 5/3 for falls/altered mental status but often was alert enough to refuse transfer to hospital.  However evening of 5/3 patient had additional fall with worsening altered mental status that resulted in recall to EMS and eventual transfer to hospital.  Patient required 5 mg IV Versed with EMS secondary to agitation. ? ?Per chart review and history obtained from family patient has long history with struggle with substance abuse including crack cocaine, PCP, and heavy alcohol use.  Patient typically presents to ED towards the end of the month when finds have expired and this leads to withdrawal symptoms.  Family states it is unlikely that she is currently withdrawing only as she recently received her welfare check May 1. ? ?On ED evaluation this a.m. patient is seen mildly febrile with temperature 100.3, tachypneic with respiratory rate 25-30, tachycardia with heart rate 1 30-1 50, and mild hypertension.  Currently able to protect airway and on room air, however she has snoring respirations on assessment.  ? ?PCCM consulted midmorning of 5/4 for worsening agitation despite aggressive pharmacologic therapy   ? ?Pertinent  Medical History  ?Hypothyroidism ?Chronic pain syndrome ?Polysubstance abuse  ?Hypertension ?Colorectal cancer s/p abdominoperineal resection ? ?Significant Hospital Events: ?Including procedures, antibiotic start and stop dates in addition to other pertinent events   ?5/4 admitted for AMS and multiple falls  ? ?Interim  History / Subjective:  ?No acute events overnight. Planning on SBT this AM. ? ?Objective   ?Blood pressure 118/78, pulse 66, temperature (!) 97.4 ?F (36.3 ?C), temperature source Oral, resp. rate 18, height '5\' 2"'$  (1.575 m), weight 43.6 kg, SpO2 100 %. ?   ?Vent Mode: PRVC ?FiO2 (%):  [30 %-40 %] 30 % ?Set Rate:  [18 bmp] 18 bmp ?Vt Set:  [400 mL] 400 mL ?PEEP:  [5 cmH20] 5 cmH20 ?Plateau Pressure:  [14 cmH20-16 cmH20] 15 cmH20  ? ?Intake/Output Summary (Last 24 hours) at 03/07/2022 0801 ?Last data filed at 03/07/2022 5885 ?Gross per 24 hour  ?Intake 4104.44 ml  ?Output 420 ml  ?Net 3684.44 ml  ? ? ?Filed Weights  ? 03/06/22 1623 03/07/22 0335  ?Weight: 45.2 kg 43.6 kg  ? ? ?Examination: ?General: Deconditioned, resting in bed in no acute distress ?HEENT: ETT, moist mucous membranes. ?Neuro: Sedated. ?CV: Regular rate, rhythm. No murmurs appreciated. Warm ?PULM:  Normal respiratory effort, intubated. Clear to ausculation bilaterally. ?GI: Soft, bowel sounds active in all 4 quadrants, non-tender, non-distended ?Extremities: Warm, dry. No peripheral edema. ?Skin: No rashes or lesions ? ?Assessment & Plan:  ?#Severe sepsis 2/2 Enterococcos faecalis, Streptococcus bacteremia ?#Acute respiratory failure  ?#Acute metabolic encephalopathy ?Yesterday afternoon intubated for worsening respiratory failure, metabolic encephalopathy. Possible urinary source? Hemodynamically stable, not requiring pressors. Will continue with broad antibiotics covering for VRE, pending sensitivities. Plan for SBT this AM, evaluate if mental status has improved. ?- Continue meropenem 1g q12h, f/u sensitivities ?- Lung protective ventilation, SBT this AM ?- VAP bundle ?- Stress ulcer prophylaxis ? ?#Chronic kidney disease IIIa ?#Oliguria ?Per patient's  original chart (MRN: [id number]), she has CKDIIIa. Today sCr 1.3, which appears to be her baseline. Bicarb has improved, no longer has elevated anion gap. Ketones were present on original UA, possibly had  starvation ketosis. Urine output 0.4cc/kg/hr, will continue with fluids. Bladder scan O/N did not show retention. Will continue with fluids. ?- Continue IV fluids ?- Daily BMP ? ?#Hypokalemia ?K 2.3 this AM, being repleted this AM. Will re-check BMP this afternoon. ?- R/p BMP this afternoon ?- Check Mg in AM ? ?#Polysubstance use ?#Alcohol use disorder ?UDS on admission notable for cocaine and benzodiazepines. She was admitted ~30 hours ago, will monitor for signs of withdrawal with SBT today.  ?- Continue thiamine, folic acid, MVI ? ?#History colorectal cancer s/p abdominoperineal resection ?Ostomy in place, continues to have good output. ? ?Best Practice (right click and "Reselect all SmartList Selections" daily)  ? ?Diet/type: NPO ?DVT prophylaxis: prophylactic heparin  ?GI prophylaxis: PPI ?Lines: N/A ?Foley:  Yes, and it is still needed ?Code Status:  full code ?Last date of multidisciplinary goals of care discussion: [place] discussion pending family discussion  ? ?Labs   ?CBC: ?Recent Labs  ?Lab 03/05/22 ?2135 03/06/22 ?0409 03/06/22 ?0427 03/06/22 ?2542 03/07/22 ?0220  ?WBC 6.2 6.1  --   --  4.3  ?NEUTROABS 4.8 3.7  --   --   --   ?HGB 11.8* 11.2* 11.9* 10.9* 9.1*  ?HCT 38.9 36.7 35.0* 32.0* 28.7*  ?MCV 92.0 90.2  --   --  87.5  ?PLT 218 229  --   --  193  ? ? ? ?Basic Metabolic Panel: ?Recent Labs  ?Lab 03/05/22 ?2135 03/06/22 ?0409 03/06/22 ?0427 03/06/22 ?7062 03/07/22 ?0220  ?NA 148* 147* 147* 147* 143  ?K 4.3 4.1 4.1 2.9* 2.3*  ?CL 115* 114*  --   --  117*  ?CO2 17* 19*  --   --  20*  ?GLUCOSE 66* 105*  --   --  104*  ?BUN 26* 21*  --   --  13  ?CREATININE 1.46* 1.29*  --   --  1.30*  ?CALCIUM 9.6 9.2  --   --  8.5*  ?MG  --  2.0  1.9  --   --  1.9  ?PHOS  --  2.8  --   --   --   ? ? ?GFR: ?Estimated Creatinine Clearance: 32.1 mL/min (A) (by C-G formula based on SCr of 1.3 mg/dL (H)). ?Recent Labs  ?Lab 03/05/22 ?2135 03/06/22 ?0409 03/07/22 ?0220  ?WBC 6.2 6.1 4.3  ?LATICACIDVEN 1.7  --   --    ? ? ? ?Liver Function Tests: ?Recent Labs  ?Lab 03/05/22 ?2135 03/06/22 ?0409  ?AST 24 24  ?ALT 14 14  ?ALKPHOS 77 73  ?BILITOT 0.9 0.5  ?PROT 7.1 6.9  ?ALBUMIN 3.0* 2.8*  ? ? ?No results for input(s): LIPASE, AMYLASE in the last 168 hours. ?Recent Labs  ?Lab 03/06/22 ?0409  ?AMMONIA 29  ? ? ? ?ABG ?   ?Component Value Date/Time  ? PHART 7.310 (L) 03/06/2022 1814  ? PCO2ART 42.7 03/06/2022 1814  ? PO2ART 213 (H) 03/06/2022 1814  ? HCO3 21.3 03/06/2022 1814  ? TCO2 23 03/06/2022 1814  ? ACIDBASEDEF 5.0 (H) 03/06/2022 1814  ? O2SAT 100 03/06/2022 1814  ? ?  ? ?Coagulation Profile: ?Recent Labs  ?Lab 03/06/22 ?0409  ?INR 1.0  ? ? ? ?Cardiac Enzymes: ?Recent Labs  ?Lab 03/06/22 ?0409  ?CKTOTAL 84  ? ? ? ?HbA1C: ?No results  found for: HGBA1C ? ?CBG: ?Recent Labs  ?Lab 03/06/22 ?1650 03/06/22 ?1938 03/06/22 ?2317 03/07/22 ?1464 03/07/22 ?3142  ?GLUCAP 111* 119* 100* 92 98  ? ? ? ?Review of Systems:   ?As per HPI ? ?Past Medical History:  ?She,  has a past medical history of Acquired hypothyroidism, Chronic pain syndrome, and HTN (hypertension).  ? ?Critical care time:   ?CRITICAL CARE ?Performed by: Sanjuan Dame ? ?Total critical care time: 30 minutes ? ?Sanjuan Dame, MD ?Internal Medicine PGY-2 ?Pager: [phone] ? ? ? ? ? ? ? ?

## 2022-03-07 NOTE — Progress Notes (Signed)
Initial Nutrition Assessment ? ?DOCUMENTATION CODES:  ? ?Underweight ? ?INTERVENTION:  ? ?If unable to extubate, recommend begin TF via OG tube: ?Vital AF 1.2 at 25 ml/h, increase by 10 ml every 8 hours to goal rate of 55 ml/h (1320 ml per day) ? ?Provides 1584 kcal, 99 gm protein, 1071 ml free water daily. ? ?When diet advanced, patient would benefit from PO supplements to maximize protein intake and support wound healing. ? ?NUTRITION DIAGNOSIS:  ? ?Increased nutrient needs related to wound healing as evidenced by estimated needs. ? ?GOAL:  ? ?Patient will meet greater than or equal to 90% of their needs ? ?MONITOR:  ? ?Vent status, Skin, Labs, Diet advancement ? ?REASON FOR ASSESSMENT:  ? ?Ventilator ?  ? ?ASSESSMENT:  ? ?55 yo female admitted with encephalopathy, polymicrobial bacteremia, severe sepsis, possible UTI. PMH includes polysubstance abuse, alcohol abuse, colorectal cancer, colostomy, HTN, hypothyroidism, chronic pain syndrome. ? ?Discussed patient in ICU rounds and with RN today. ?Patient is currently intubated on ventilator support. ?Hopeful for extubation today. ?[place] RN consulted for a stage 4 sacral wound. ? ?Labs reviewed.  ?CBG: [phone] ?Tox urine screen positive for benzodiazepines and cocaine. ? ?Medications reviewed and include colace, folic acid, MVI with minerals, Miralax, thiamine, Precedex, fentanyl. ?IVF: D5 1/2 NS at 100 ml/h ? ?No recent weights available for review. ?Suspect patient is malnourished, given low BMI, stage IV sacral wound.  ? ?NUTRITION - FOCUSED PHYSICAL EXAM: ? ?Unable to complete, patient agitated ? ?Diet Order:   ?Diet Order   ? ?       ?  Diet NPO time specified  Diet effective now       ?  ? ?  ?  ? ?  ? ? ?EDUCATION NEEDS:  ? ?Not appropriate for education at this time ? ?Skin:  Skin Assessment: Skin Integrity Issues: ?Skin Integrity Issues:: Stage IV ?Stage IV: sacrum ? ?Last BM:  no BM documented ? ?Height:  ? ?Ht Readings from Last 1 Encounters:  ?03/06/22 5'  2" (1.575 m)  ? ? ?Weight:  ? ?Wt Readings from Last 1 Encounters:  ?03/07/22 43.6 kg  ? ? ?Ideal Body Weight:  50 kg ? ?BMI:  Body mass index is 17.58 kg/m?. ? ?Estimated Nutritional Needs:  ? ?Kcal:  1500-1700 ? ?Protein:  75-90 gm ? ?Fluid:  1.5-1.7 L ? ? ? ?Lucas Mallow RD, LDN, CNSC ?Please refer to Amion for contact information.                                                       ? ?

## 2022-03-07 NOTE — Progress Notes (Addendum)
? ?NAME:  Sherry Baker, MRN:  [id number], DOB:  08/13/1960, LOS: 2 ?ADMISSION DATE:  03/05/2022, CONSULTATION DATE:  03/06/2022 ?REFERRING MD:  Dr. Posey Pronto, CHIEF COMPLAINT:  Agitation   ? ?History of Present Illness:  ?Sherry Baker is a 56 y.o. female with a PMH significant for hypothyroidism, chronic pain syndrome, and HTN who presented to the emergency department overnight 5/3 due to altered mental status.  Per chart review it appears patient has had multiple EMS calls to the resident prior to 5/3 for falls/altered mental status but often was alert enough to refuse transfer to hospital.  However evening of 5/3 patient had additional fall with worsening altered mental status that resulted in recall to EMS and eventual transfer to hospital.  Patient required 5 mg IV Versed with EMS secondary to agitation. ? ?Per chart review and history obtained from family patient has long history with struggle with substance abuse including crack cocaine, PCP, and heavy alcohol use.  Patient typically presents to ED towards the end of the month when finds have expired and this leads to withdrawal symptoms.  Family states it is unlikely that she is currently withdrawing only as she recently received her welfare check May 1. ? ?On ED evaluation this a.m. patient is seen mildly febrile with temperature 100.3, tachypneic with respiratory rate 25-30, tachycardia with heart rate 1 30-1 50, and mild hypertension.  Currently able to protect airway and on room air, however she has snoring respirations on assessment.  ? ?PCCM consulted midmorning of 5/4 for worsening agitation despite aggressive pharmacologic therapy   ? ?Pertinent  Medical History  ?Hypothyroidism ?Chronic pain syndrome ?Polysubstance abuse  ?Hypertension ?Colorectal cancer s/p abdominoperineal resection ? ?Significant Hospital Events: ?Including procedures, antibiotic start and stop dates in addition to other pertinent events   ?5/4 admitted for AMS and multiple falls  ? ?Interim  History / Subjective:  ?No acute events overnight. Planning on SBT this AM. ? ?Objective   ?Blood pressure 118/78, pulse 66, temperature (!) 97.4 ?F (36.3 ?C), temperature source Oral, resp. rate 18, height '5\' 2"'$  (1.575 m), weight 43.6 kg, SpO2 100 %. ?   ?Vent Mode: PRVC ?FiO2 (%):  [30 %-40 %] 30 % ?Set Rate:  [18 bmp] 18 bmp ?Vt Set:  [400 mL] 400 mL ?PEEP:  [5 cmH20] 5 cmH20 ?Plateau Pressure:  [14 cmH20-16 cmH20] 15 cmH20  ? ?Intake/Output Summary (Last 24 hours) at 03/07/2022 0840 ?Last data filed at 03/07/2022 9774 ?Gross per 24 hour  ?Intake 4104.44 ml  ?Output 420 ml  ?Net 3684.44 ml  ? ? ?Filed Weights  ? 03/06/22 1623 03/07/22 0335  ?Weight: 45.2 kg 43.6 kg  ? ? ?Examination: ?General: Deconditioned, resting in bed in no acute distress ?HEENT: ETT, moist mucous membranes. ?Neuro: Sedated. ?CV: Regular rate, rhythm. No murmurs appreciated. Warm ?PULM:  Normal respiratory effort, intubated. Clear to ausculation bilaterally. ?GI: Soft, bowel sounds active in all 4 quadrants, non-tender, non-distended ?Extremities: Warm, dry. No peripheral edema. ?Skin: No rashes or lesions ? ?Assessment & Plan:  ?#Severe sepsis 2/2 Enterococcos faecalis, Streptococcus bacteremia ?#Acute respiratory failure  ?#Acute metabolic encephalopathy ?Yesterday afternoon intubated for worsening respiratory failure, metabolic encephalopathy. Possible urinary source? Hemodynamically stable, not requiring pressors. Will continue with broad antibiotics covering for VRE, pending sensitivities. Plan for SBT this AM, evaluate if mental status has improved. ?- Continue meropenem 1g q12h, f/u sensitivities ?- Lung protective ventilation, SBT this AM ?- VAP bundle ?- Stress ulcer prophylaxis ? ?#Chronic kidney disease IIIa ?#Oliguria ?Per patient's  original chart (MRN: [id number]), she has CKDIIIa. Today sCr 1.3, which appears to be her baseline. Bicarb has improved, no longer has elevated anion gap. Ketones were present on original UA, possibly had  starvation ketosis. Urine output 0.4cc/kg/hr, will continue with fluids. Bladder scan O/N did not show retention. Will continue with fluids. ?- Continue IV fluids ?- Daily BMP ? ?#Hypokalemia ?K 2.3 this AM, being repleted this AM. Will re-check BMP this afternoon. ?- R/p BMP this afternoon ?- Check Mg in AM ? ?#Polysubstance use ?#Alcohol use disorder ?UDS on admission notable for cocaine and benzodiazepines. She was admitted ~30 hours ago, will monitor for signs of withdrawal with SBT today.  ?- Continue thiamine, folic acid, MVI ? ?#History colorectal cancer s/p abdominoperineal resection ?Ostomy in place, continues to have good output. ?-WOC consult ? ?Best Practice (right click and "Reselect all SmartList Selections" daily)  ? ?Diet/type: NPO ?DVT prophylaxis: LMWH ?GI prophylaxis: PPI ?Lines: N/A ?Foley:  Yes, and it is still needed ?Code Status:  full code ?Last date of multidisciplinary goals of care discussion: [place] discussion pending family discussion  ? ?Labs   ?CBC: ?Recent Labs  ?Lab 03/05/22 ?2135 03/06/22 ?0409 03/06/22 ?0427 03/06/22 ?9794 03/07/22 ?0220  ?WBC 6.2 6.1  --   --  4.3  ?NEUTROABS 4.8 3.7  --   --   --   ?HGB 11.8* 11.2* 11.9* 10.9* 9.1*  ?HCT 38.9 36.7 35.0* 32.0* 28.7*  ?MCV 92.0 90.2  --   --  87.5  ?PLT 218 229  --   --  193  ? ? ? ?Basic Metabolic Panel: ?Recent Labs  ?Lab 03/05/22 ?2135 03/06/22 ?0409 03/06/22 ?0427 03/06/22 ?8016 03/07/22 ?0220  ?NA 148* 147* 147* 147* 143  ?K 4.3 4.1 4.1 2.9* 2.3*  ?CL 115* 114*  --   --  117*  ?CO2 17* 19*  --   --  20*  ?GLUCOSE 66* 105*  --   --  104*  ?BUN 26* 21*  --   --  13  ?CREATININE 1.46* 1.29*  --   --  1.30*  ?CALCIUM 9.6 9.2  --   --  8.5*  ?MG  --  2.0  1.9  --   --  1.9  ?PHOS  --  2.8  --   --   --   ? ? ?GFR: ?Estimated Creatinine Clearance: 32.1 mL/min (A) (by C-G formula based on SCr of 1.3 mg/dL (H)). ?Recent Labs  ?Lab 03/05/22 ?2135 03/06/22 ?0409 03/07/22 ?0220  ?WBC 6.2 6.1 4.3  ?LATICACIDVEN 1.7  --   --   ? ? ? ?Liver  Function Tests: ?Recent Labs  ?Lab 03/05/22 ?2135 03/06/22 ?0409  ?AST 24 24  ?ALT 14 14  ?ALKPHOS 77 73  ?BILITOT 0.9 0.5  ?PROT 7.1 6.9  ?ALBUMIN 3.0* 2.8*  ? ? ?No results for input(s): LIPASE, AMYLASE in the last 168 hours. ?Recent Labs  ?Lab 03/06/22 ?0409  ?AMMONIA 29  ? ? ? ?ABG ?   ?Component Value Date/Time  ? PHART 7.310 (L) 03/06/2022 1814  ? PCO2ART 42.7 03/06/2022 1814  ? PO2ART 213 (H) 03/06/2022 1814  ? HCO3 21.3 03/06/2022 1814  ? TCO2 23 03/06/2022 1814  ? ACIDBASEDEF 5.0 (H) 03/06/2022 1814  ? O2SAT 100 03/06/2022 1814  ? ?  ? ?Coagulation Profile: ?Recent Labs  ?Lab 03/06/22 ?0409  ?INR 1.0  ? ? ? ?Cardiac Enzymes: ?Recent Labs  ?Lab 03/06/22 ?0409  ?CKTOTAL 84  ? ? ? ?HbA1C: ?No results  found for: HGBA1C ? ?CBG: ?Recent Labs  ?Lab 03/06/22 ?1650 03/06/22 ?1938 03/06/22 ?2317 03/07/22 ?6190 03/07/22 ?1222  ?GLUCAP 111* 119* 100* 92 98  ? ? ? ?Review of Systems:   ?As per HPI ? ?Past Medical History:  ?She,  has a past medical history of Acquired hypothyroidism, Chronic pain syndrome, and HTN (hypertension).  ? ?Critical care time:   ?CRITICAL CARE ?Performed by: Sanjuan Dame ? ?Total critical care time: 30 minutes ? ?Sanjuan Dame, MD ?Internal Medicine PGY-2 ?Pager: [phone] ? ? ? ? ? ? ? ?

## 2022-03-07 NOTE — Progress Notes (Signed)

## 2022-03-07 NOTE — Progress Notes (Addendum)
? ? ?  [facility] has been requested to perform a transesophageal echocardiogram on 03/10/2022, at 11:00 am for bacteremia.  After careful review of history and examination, the risks and benefits of transesophageal echocardiogram have been explained including risks of esophageal damage, perforation (1:10,000 risk), bleeding, pharyngeal hematoma as well as other potential complications associated with conscious sedation including aspiration, arrhythmia, respiratory failure and death. Alternatives to treatment were discussed, questions were answered. Pt aunt by marriage was contacted and agrees to the procedure. Pt cousin was contacted and she said for [place] to do as Ms Gildardo Cranker said.    ? ?If pt is extubated over the weekend, contact Cardiology first thing Monday morning.  ? ?Rosaria Ferries, PA-C ?03/07/2022 4:00 PM   ?

## 2022-03-07 NOTE — Consult Note (Signed)
? ? ?[facility]  ?                                                                                     ? ?Patient Identification: ?Patient Name: Sherry Baker MRN: [id number] [place] Date: 03/05/2022  9:10 PM ?Today's Date: 03/07/2022 ?Reason for consult: Bacteremia ?Requesting provider: Baltazar Apo  ? ?Principal Problem: ?  Acute encephalopathy ?Active Problems: ?  UTI (urinary tract infection) ?  Hypoglycemia ?  Hypernatremia ?  Cocaine abuse ([place]) ?  High anion gap metabolic acidosis ?  Elevated serum creatinine ?  Hypercalcemia ?  Acquired hypothyroidism ?  HTN (hypertension) ?  AMS (altered mental status) ?  Endotracheally intubated ? ? ?Antibiotics:  ?Ceftriaxone 5/3- ?Meropenem 5/4-c ? ?Lines/Hardware: ? ?Assessment ?# Acute Respiratory Failure in the setting of acute encephalopathy 2/2 possible intoxication vs sepsis ?# E fecalis/Staph hemolyticus bacteremia - staph hemolyticus is likely a contaminant and would not target tx for it. Urinary source is possible. ? ?# Stage 4 Decubitus Sacral Ulcer - looks clean on exam and do not appear to be the source of bacteremia  ?# Hypokalemia ?# Substance use ? ?# Recent MRSA bacteremia ( treated with 3 weeks of IV Vancomycin until 4/14 and discharged on PO doxycycline for 3 weeks) ? ? ?Recommendations  ?Switch meropenem to IV ampicillin ?Repeat 2 sets of blood cultures today  ?TTE ?Check HIV ab and HCV ab given substance use h/o  ?WOC consult for wound care  ?She appears to have 2 charts in epic and will need to be merged.  ?Following  ? ?Rest of the management as per the primary team. Please call with questions or concerns.  ?Thank you for the consult ? ?Rosiland Oz, MD ?Infectious Disease Physician ?[facility] ?[address] ?[place], [place] [address] ?Phone: [phone]  Fax:  [phone] ? ?__________________________________________________________________________________________________________ ?HPI and Hospital Course: ?56 year old female with a PMH of HTN, acquired hypothyroidism, chronic pain syndrome chronic opioid, history of colon cancer status post colostomy, polysubstance abuse, GAD, bipolar disorder, CKD, Complicated MRSA bacteremia ( TTE and TEE negative for endocarditis, source thought to be pustules in hand with +/- septic arthritis, initial IV Vancomycin for 3 weeks until 3/21>> PO doxycyline for 3 weeks on discharge),   recent admission 4/2-4/13 for altered mental status likely secondary to drug use and she was intubated + treated for aspiration pneumonia who presented to the ED from home with altered mental status.  She has had multiple falls in the last couple of days but patient refused to come.  On EMS arrival patient was moaning and groaning and was placed in restraints, covered in stool, received Versed 5 mg, noted to have hematoma in right forehead. ? ?At ED febrile with Tmax 101.2 ?Labs remarkable for lactic acid 1.7, WBC 6.2, NA 148 creatinine 1.46 ?Imagings as below with no acute findings ?Blood cx 03/05/22 E faecalis and staph hemolyticus in 1/2 sets ?Urinary cx 5/4 no growth  ? ?5/4 intubated for worsening respiratory failure and encephalopathy ? ?ROS: unavailable as patient is intubated  ? ?Past Medical History:  ?Diagnosis Date  ?  Acquired hypothyroidism   ? Chronic pain syndrome   ? HTN (hypertension)   ? ?Past Surgical History:  ?Procedure Laterality Date  ? Colostomy    ? ? ? ?Scheduled Meds: ? chlorhexidine gluconate (MEDLINE KIT)  15 mL Mouth Rinse BID  ? Chlorhexidine Gluconate Cloth  6 each Topical Q0600  ? cloNIDine  0.1 mg Transdermal Weekly  ? docusate  100 mg Per Tube BID  ? fentaNYL (SUBLIMAZE) injection  50 mcg Intravenous Once  ? folic acid  1 mg Oral Daily  ? mouth rinse  15 mL Mouth Rinse 10 times per day  ? multivitamin with minerals  1 tablet  Oral Daily  ? mupirocin ointment  1 application. Nasal BID  ? pantoprazole sodium  40 mg Per Tube Daily  ? polyethylene glycol  17 g Per Tube Daily  ? sodium chloride flush  10-40 mL Intracatheter Q12H  ? thiamine  100 mg Oral Daily  ? Or  ? thiamine  100 mg Intravenous Daily  ? ?Continuous Infusions: ? sodium chloride Stopped (03/06/22 1857)  ? dextrose 5 % and 0.45% NaCl 100 mL/hr at 03/07/22 0600  ? fentaNYL infusion INTRAVENOUS 100 mcg/hr (03/07/22 0600)  ? meropenem (MERREM) IV 1 g (03/07/22 0610)  ? norepinephrine (LEVOPHED) Adult infusion Stopped (03/06/22 1945)  ? propofol (DIPRIVAN) infusion 15 mcg/kg/min (03/07/22 0748)  ? ?PRN Meds:.acetaminophen **OR** acetaminophen, dextrose, fentaNYL, LORazepam, oxymetazoline, sodium chloride flush ? ?Not on File ? ?Social History  ? ?Socioeconomic History  ? Marital status: Unknown  ?  Spouse name: Not on file  ? Number of children: Not on file  ? Years of education: Not on file  ? Highest education level: Not on file  ?Occupational History  ? Not on file  ?Tobacco Use  ? Smoking status: Unknown  ? Smokeless tobacco: Not on file  ?Substance and Sexual Activity  ? Alcohol use: Not on file  ? Drug use: Not on file  ? Sexual activity: Not on file  ?Other Topics Concern  ? Not on file  ?Social History Narrative  ? Not on file  ? ?Social Determinants of Health  ? ?Financial Resource Strain: Not on file  ?Food Insecurity: Not on file  ?Transportation Needs: Not on file  ?Physical Activity: Not on file  ?Stress: Not on file  ?Social Connections: Not on file  ?Intimate Partner Violence: Not on file  ? ? ?Breast Cancer-relatedfamily history is not on file. ?' ?Vitals ?BP 118/78   Pulse 66   Temp (!) 97.4 ?F (36.3 ?C) (Oral)   Resp 18   Ht '5\' 2"'  (1.575 m)   Wt 43.6 kg   SpO2 100%   BMI 17.58 kg/m?  ? ? ?Physical Exam ?Constitutional:  orotracheally intubated  ?   Comments: low dose propofol and fentanyl  ? ?Cardiovascular:  ?   Rate and Rhythm: ?   Heart sounds:  Tachycardic  ? ?Pulmonary:  ?   Effort: Pulmonary effort is normal on vent  ?   Comments: Clear breath sound bilaterally  ? ?Abdominal:  ?   Palpations: Abdomen is soft.  ?   Tenderness: LLQ colostomy + ? ?Musculoskeletal:     ?   General: No swelling/redness in peripheral joints ? ?Skin: ?   Comments:  ? ? ? ?Neurological:  ?   General: Unable to assess ? ? ?Pertinent Microbiology ?Results for orders placed or performed during the hospital encounter of 03/05/22  ?Culture, blood (routine x 2)  Status: None (Preliminary result)  ? Collection Time: 03/05/22  9:25 PM  ? Specimen: BLOOD  ?Result Value Ref Range Status  ? Specimen Description BLOOD SITE NOT SPECIFIED  Final  ? Special Requests   Final  ?  BOTTLES DRAWN AEROBIC AND ANAEROBIC Blood Culture adequate volume  ? Culture  Setup Time   Final  ?  GRAM POSITIVE COCCI IN CHAINS ?AEROBIC BOTTLE ONLY ?CRITICAL RESULT CALLED TO, READ BACK BY AND VERIFIED WITH: ?PHARMD CARLA JARDIN ON 03/06/22 @ 1559 BY DRT ?Performed at [facility], [address] [place], [place] [address] ?  ? Culture GRAM POSITIVE COCCI  Final  ? Report Status PENDING  Incomplete  ?Blood Culture ID Panel (Reflexed)     Status: Abnormal  ? Collection Time: 03/05/22  9:25 PM  ?Result Value Ref Range Status  ? Enterococcus faecalis DETECTED (A) NOT DETECTED Final  ?  Comment: PHARMD CARLA JARDIN ON 03/06/22 @ 1559 BY DRT  ? Enterococcus Faecium NOT DETECTED NOT DETECTED Final  ? Listeria monocytogenes NOT DETECTED NOT DETECTED Final  ? Staphylococcus species DETECTED (A) NOT DETECTED Final  ?  Comment: CRITICAL RESULT CALLED TO, READ BACK BY AND VERIFIED WITH: ?PHARMD CARLA JARDIN ON 03/06/22 @ 1559 BY DRT ?  ? Staphylococcus aureus (BCID) NOT DETECTED NOT DETECTED Final  ? Staphylococcus epidermidis NOT DETECTED NOT DETECTED Final  ? Staphylococcus lugdunensis NOT DETECTED NOT DETECTED Final  ? Streptococcus species NOT DETECTED NOT DETECTED Final  ? Streptococcus agalactiae NOT DETECTED NOT  DETECTED Final  ? Streptococcus pneumoniae NOT DETECTED NOT DETECTED Final  ? Streptococcus pyogenes NOT DETECTED NOT DETECTED Final  ? A.calcoaceticus-baumannii NOT DETECTED NOT DETECTED Final  ? Bacteroides fragilis NOT DETECTED NOT DETECTED Final  ? Enterobacteral

## 2022-03-07 NOTE — Progress Notes (Signed)
eLink Physician-Brief Progress Note ?Patient Name: Sherry Baker ?DOB: 08/13/1960 ?MRN: [id number] ? ? ?Date of Service ? 03/07/2022  ?HPI/Events of Note ? Hypokalemia  Hypomagnesemia - K+ = 2.3, Mg++ = 1.9 and Creatinine = 1.3.   ?eICU Interventions ? Will replace K+ and Mg++.   ? ? ? ?Intervention Category ?Major Interventions: Electrolyte abnormality - evaluation and management ? ?Sherry Baker ?03/07/2022, 3:51 AM ?

## 2022-03-08 LAB — GLUCOSE, CAPILLARY
Glucose-Capillary: 118 mg/dL — ABNORMAL HIGH (ref 70–99)
Glucose-Capillary: 131 mg/dL — ABNORMAL HIGH (ref 70–99)
Glucose-Capillary: 129 mg/dL — ABNORMAL HIGH (ref 70–99)
Glucose-Capillary: 118 mg/dL — ABNORMAL HIGH (ref 70–99)
Glucose-Capillary: 128 mg/dL — ABNORMAL HIGH (ref 70–99)
Glucose-Capillary: 103 mg/dL — ABNORMAL HIGH (ref 70–99)

## 2022-03-08 LAB — BASIC METABOLIC PANEL
Anion gap: 6 (ref 5–15)
BUN: 6 mg/dL (ref 6–20)
CO2: 18 mmol/L — ABNORMAL LOW (ref 22–32)
Calcium: 8.4 mg/dL — ABNORMAL LOW (ref 8.9–10.3)
Chloride: 114 mmol/L — ABNORMAL HIGH (ref 98–111)
Creatinine, Ser: 1.31 mg/dL — ABNORMAL HIGH (ref 0.44–1.00)
GFR, Estimated: 48 mL/min — ABNORMAL LOW (ref >60)
Glucose, Bld: 126 mg/dL — ABNORMAL HIGH (ref 70–99)
Potassium: 3.4 mmol/L — ABNORMAL LOW (ref 3.5–5.1)
Sodium: 138 mmol/L (ref 135–145)

## 2022-03-08 LAB — CBC
HCT: 31.2 % — ABNORMAL LOW (ref 36.0–46.0)
Hemoglobin: 9.4 g/dL — ABNORMAL LOW (ref 12.0–15.0)
MCH: 27.3 pg (ref 26.0–34.0)
MCHC: 30.1 g/dL (ref 30.0–36.0)
MCV: 90.7 fL (ref 80.0–100.0)
Platelets: 184 10*3/uL (ref 150–400)
RBC: 3.44 MIL/uL — ABNORMAL LOW (ref 3.87–5.11)
RDW: 19.9 % — ABNORMAL HIGH (ref 11.5–15.5)
WBC: 3.9 10*3/uL — ABNORMAL LOW (ref 4.0–10.5)
nRBC: 0 % (ref 0.0–0.2)

## 2022-03-08 LAB — HIV ANTIBODY (ROUTINE TESTING W REFLEX): HIV Screen 4th Generation wRfx: NONREACTIVE

## 2022-03-08 LAB — PHOSPHORUS: Phosphorus: 3 mg/dL (ref 2.5–4.6)

## 2022-03-08 LAB — MAGNESIUM: Magnesium: 1.6 mg/dL — ABNORMAL LOW (ref 1.7–2.4)

## 2022-03-08 MED ORDER — THIAMINE HCL 100 MG PO TABS
100.0000 mg | ORAL_TABLET | Freq: Every day | ORAL | Status: DC
  Filled 2022-03-08: qty 1

## 2022-03-08 MED ORDER — POTASSIUM CHLORIDE 20 MEQ PO PACK
40.0000 meq | PACK | Freq: Once | ORAL | Status: AC
  Administered 2022-03-08: 40 meq
  Filled 2022-03-08: qty 2

## 2022-03-08 MED ORDER — MAGNESIUM SULFATE 4 GM/100ML IV SOLN
4.0000 g | Freq: Once | INTRAVENOUS | Status: AC
  Administered 2022-03-08: 4 g via INTRAVENOUS
  Filled 2022-03-08 (×3): qty 100

## 2022-03-08 MED ORDER — CEFTRIAXONE SODIUM 2 G IJ SOLR
2.0000 g | Freq: Two times a day (BID) | INTRAMUSCULAR | Status: DC
  Administered 2022-03-08 – 2022-03-11 (×7): 2 g via INTRAVENOUS
  Filled 2022-03-08 (×7): qty 20

## 2022-03-08 MED ORDER — ACETAMINOPHEN 650 MG RE SUPP: 650.0000 mg | Freq: Four times a day (QID) | RECTAL | Status: DC | PRN

## 2022-03-08 MED ORDER — THIAMINE HCL 100 MG/ML IJ SOLN
100.0000 mg | Freq: Every day | INTRAMUSCULAR | Status: DC
  Administered 2022-03-08 – 2022-03-09 (×2): 100 mg via INTRAVENOUS
  Filled 2022-03-08 (×2): qty 2

## 2022-03-08 MED ORDER — ADULT MULTIVITAMIN W/MINERALS CH
1.0000 | ORAL_TABLET | Freq: Every day | ORAL | Status: DC
  Administered 2022-03-08 – 2022-03-12 (×2): 1
  Filled 2022-03-08 (×3): qty 1

## 2022-03-08 MED ORDER — ACETAMINOPHEN 325 MG PO TABS
650.0000 mg | ORAL_TABLET | Freq: Four times a day (QID) | ORAL | Status: DC | PRN
  Administered 2022-03-12: 650 mg
  Filled 2022-03-08: qty 2

## 2022-03-08 MED ORDER — FOLIC ACID 1 MG PO TABS
1.0000 mg | ORAL_TABLET | Freq: Every day | ORAL | Status: DC
  Administered 2022-03-08 – 2022-03-09 (×2): 1 mg
  Filled 2022-03-08: qty 1

## 2022-03-08 MED ORDER — LORAZEPAM 2 MG/ML IJ SOLN
1.0000 mg | INTRAMUSCULAR | Status: DC | PRN
  Administered 2022-03-09 – 2022-03-14 (×16): 1 mg via INTRAVENOUS
  Filled 2022-03-08 (×16): qty 1

## 2022-03-08 NOTE — Procedures (Signed)
Extubation Procedure Note ? ?Patient Details:   ?Name: Sherry Baker ?DOB: 11-17-1965 ?MRN: [id number] ?  ?Airway Documentation:  ?  ?Vent end date: 03/08/22 Vent end time: 1742  ? ?Evaluation ? O2 sats: stable throughout ?Complications: No apparent complications ?Patient did tolerate procedure well. ?Bilateral Breath Sounds: Rhonchi, Diminished ?  ?Yes, ? ?Per MD order, pt was extubated to 4L . Prior extubation pt did have a positive cuff leak. Pt tolerated well with SVS. No stridor noted and pt was able to state her full name. RN currently at bedside. RT will continue to monitor pt as needed. ? ?Jorje Guild ?03/08/2022, 5:42 PM ? ?

## 2022-03-08 NOTE — Progress Notes (Signed)
eLink Physician-Brief Progress Note ?Patient Name: Sherry Baker ?DOB: 1965-12-20 ?MRN: [id number] ? ? ?Date of Service ? 03/08/2022  ?HPI/Events of Note ? Delirium d/t ETOH withdrawal. Starting to hallucinate and attempting to get OOB. Currently on Precedex IV infusion at ceiling of 1.2 mcg/kg/hour and Ativan 1 mg Q 4 hours PRN agitation.   ?eICU Interventions ? Plan: ?Increase ceiling on Precedex IV infusion to 1.6 mcg/kg/hour. Titrate to RASS = 0. ?Increase Ativan to 1 mg IV Q 2 hours PRN agitation.   ? ? ? ?Intervention Category ?Major Interventions: Delirium, psychosis, severe agitation - evaluation and management ? ?Taniaya Rudder [name] ?03/08/2022, 11:21 PM ?

## 2022-03-08 NOTE — Progress Notes (Signed)
ID Brief note  ? ?TTE with Possible hypermobile densities on the  tricuspid valve. RA hypermobile density , chiari network is possible.  ?Recommend TEE to r/o endocarditis.  ? ?Recommend ampicillin and ceftriaxone for possible TV endocarditis given h/o drug use, bacteremia ?Needs TEE ? ?Rosiland Oz, MD ?Infectious Disease Physician ?[facility] ?[address] ?[place], [place] [address] ?Phone: [phone]  Fax: [phone] ? ?

## 2022-03-08 NOTE — Progress Notes (Signed)
? ?NAME:  Sherry Baker, MRN:  [id number], DOB:  [birth year], LOS: 3 ?ADMISSION DATE:  03/05/2022, CONSULTATION DATE:  03/06/2022 ?REFERRING MD:  Dr. Posey Pronto, CHIEF COMPLAINT:  Agitation   ? ?History of Present Illness:  ?Sherry Baker is a 56 y.o. female with a PMH significant for hypothyroidism, chronic pain syndrome, and HTN who presented to the emergency department overnight 5/3 due to altered mental status.  Per chart review it appears patient has had multiple EMS calls to the resident prior to 5/3 for falls/altered mental status but often was alert enough to refuse transfer to hospital.  However evening of 5/3 patient had additional fall with worsening altered mental status that resulted in recall to EMS and eventual transfer to hospital.  Patient required 5 mg IV Versed with EMS secondary to agitation. ? ?Per chart review and history obtained from family patient has long history with struggle with substance abuse including crack cocaine, PCP, and heavy alcohol use.  Patient typically presents to ED towards the end of the month when finds have expired and this leads to withdrawal symptoms.  Family states it is unlikely that she is currently withdrawing only as she recently received her welfare check May 1. ? ?On ED evaluation this a.m. patient is seen mildly febrile with temperature 100.3, tachypneic with respiratory rate 25-30, tachycardia with heart rate 1 30-1 50, and mild hypertension.  Currently able to protect airway and on room air, however she has snoring respirations on assessment.  ? ?PCCM consulted midmorning of 5/4 for worsening agitation despite aggressive pharmacologic therapy   ? ?Pertinent  Medical History  ?Hypothyroidism ?Chronic pain syndrome ?Polysubstance abuse  ?Hypertension ?Colorectal cancer s/p abdominoperineal resection ? ?Significant Hospital Events: ?Including procedures, antibiotic start and stop dates in addition to other pertinent events   ?5/4 admitted for AMS and multiple falls  ? ?Interim  History / Subjective:  ? ?She to do PSV yesterday but continued to have some agitated delirium so extubation was deferred ?Currently on both propofol and Precedex ?I/O+ 5.7 L total ?TTE possible mobile densities on the tricuspid valve, question Chiari network.  TEE recommended ? ?Objective   ?Blood pressure 132/88, pulse 62, temperature (!) 97.1 ?F (36.2 ?C), temperature source Oral, resp. rate 13, height '5\' 2"'$  (1.575 m), weight 47.1 kg, SpO2 100 %. ?   ?Vent Mode: PRVC ?FiO2 (%):  [30 %] 30 % ?Set Rate:  [12 bmp-18 bmp] 12 bmp ?Vt Set:  [400 mL] 400 mL ?PEEP:  [5 cmH20] 5 cmH20 ?Pressure Support:  [5 cmH20] 5 cmH20 ?Plateau Pressure:  [15 cmH20-16 cmH20] 15 cmH20  ? ?Intake/Output Summary (Last 24 hours) at 03/08/2022 0717 ?Last data filed at 03/08/2022 0608 ?Gross per 24 hour  ?Intake 2975.23 ml  ?Output 1465 ml  ?Net 1510.23 ml  ? ?Filed Weights  ? 03/06/22 1623 03/07/22 0335 03/08/22 0115  ?Weight: 45.2 kg 43.6 kg 47.1 kg  ? ? ?Examination: ?General: Deconditioned, resting in bed in no acute distress ?HEENT: ETT, moist mucous membranes. ?Neuro: Sedated, propofol still running ?CV: Regular rate, rhythm. No murmurs appreciated. Warm ?PULM:  Normal respiratory effort, intubated. Clear to ausculation bilaterally. ?GI: Soft, bowel sounds active in all 4 quadrants, non-tender, non-distended ?Extremities: Warm, dry. No peripheral edema. ?Skin: No rashes or lesions ? ?Assessment & Plan:  ?#Severe sepsis 2/2 Enterococcos faecalis, Streptococcus bacteremia with suspected endocarditis ?#Acute respiratory failure  ?#Acute metabolic encephalopathy ?-PRVC.  Lighten sedation and push for SBT's, hopefully extubation 5/6 ?-Antibiotics: Ampicillin beginning 5/5 ?-Question embolic ?-Stress ulcer  prophylaxis ordered ?-TEE recommended, planned for Monday 5/8 ? ?#Chronic kidney disease IIIa ?#Oliguria ?Per patient's original chart (MRN: [id number]), she has CKDIIIa. Today sCr 1.3, which appears to be her baseline.  ?-Follow BMP, urine  output ?-Serum creatinine stable at 1.31 ?-Replace electrolytes as indicated ? ?#Hypokalemia, hypomagnesemia ?-Replace electrolytes as indicated ? ? ?#Polysubstance use ?#Alcohol use disorder ?-Continue thiamine, folic acid, MVI ?-Monitor for signs of withdrawal ? ?#History colorectal cancer s/p abdominoperineal resection ?Ostomy in place, continues to have good output. ?-Appreciate WOC consult ? ?Best Practice (right click and "Reselect all SmartList Selections" daily)  ? ?Diet/type: NPO ?DVT prophylaxis: LMWH ?GI prophylaxis: PPI ?Lines: N/A ?Foley:  Yes, and it is still needed ?Code Status:  full code ?Last date of multidisciplinary goals of care discussion: [place] discussion pending family discussion  ? ?Labs   ?CBC: ?Recent Labs  ?Lab 03/05/22 ?2135 03/06/22 ?0409 03/06/22 ?0427 03/06/22 ?3818 03/07/22 ?2993 03/08/22 ?0256  ?WBC 6.2 6.1  --   --  4.3 3.9*  ?NEUTROABS 4.8 3.7  --   --   --   --   ?HGB 11.8* 11.2* 11.9* 10.9* 9.1* 9.4*  ?HCT 38.9 36.7 35.0* 32.0* 28.7* 31.2*  ?MCV 92.0 90.2  --   --  87.5 90.7  ?PLT 218 229  --   --  193 184  ? ? ?Basic Metabolic Panel: ?Recent Labs  ?Lab 03/05/22 ?2135 03/06/22 ?0409 03/06/22 ?0427 03/06/22 ?7169 03/07/22 ?6789 03/07/22 ?0932 03/08/22 ?0256  ?NA 148* 147* 147* 147* 143 140 138  ?K 4.3 4.1 4.1 2.9* 2.3* 4.5 3.4*  ?CL 115* 114*  --   --  117* 118* 114*  ?CO2 17* 19*  --   --  20* 17* 18*  ?GLUCOSE 66* 105*  --   --  104* 95 126*  ?BUN 26* 21*  --   --  '13 11 6  '$ ?CREATININE 1.46* 1.29*  --   --  1.30* 1.25* 1.31*  ?CALCIUM 9.6 9.2  --   --  8.5* 8.4* 8.4*  ?MG  --  2.0  1.9  --   --  1.9  --  1.6*  ?PHOS  --  2.8  --   --   --   --   --   ? ?GFR: ?Estimated Creatinine Clearance: 36.5 mL/min (A) (by C-G formula based on SCr of 1.31 mg/dL (H)). ?Recent Labs  ?Lab 03/05/22 ?2135 03/06/22 ?0409 03/07/22 ?0220 03/08/22 ?0256  ?WBC 6.2 6.1 4.3 3.9*  ?LATICACIDVEN 1.7  --   --   --   ? ? ?Liver Function Tests: ?Recent Labs  ?Lab 03/05/22 ?2135 03/06/22 ?0409  ?AST 24 24   ?ALT 14 14  ?ALKPHOS 77 73  ?BILITOT 0.9 0.5  ?PROT 7.1 6.9  ?ALBUMIN 3.0* 2.8*  ? ?No results for input(s): LIPASE, AMYLASE in the last 168 hours. ?Recent Labs  ?Lab 03/06/22 ?0409  ?AMMONIA 29  ? ? ?ABG ?   ?Component Value Date/Time  ? PHART 7.310 (L) 03/06/2022 1814  ? PCO2ART 42.7 03/06/2022 1814  ? PO2ART 213 (H) 03/06/2022 1814  ? HCO3 21.3 03/06/2022 1814  ? TCO2 23 03/06/2022 1814  ? ACIDBASEDEF 5.0 (H) 03/06/2022 1814  ? O2SAT 100 03/06/2022 1814  ? ?  ? ?Coagulation Profile: ?Recent Labs  ?Lab 03/06/22 ?0409  ?INR 1.0  ? ? ?Cardiac Enzymes: ?Recent Labs  ?Lab 03/06/22 ?0409  ?CKTOTAL 84  ? ? ?HbA1C: ?No results found for: HGBA1C ? ?CBG: ?Recent Labs  ?Lab 03/07/22 ?3810  03/07/22 ?1117 03/07/22 ?1510 03/07/22 ?2310 03/08/22 ?0308  ?GLUCAP 98 105* 112* 132* 131*  ? ? ?Review of Systems:   ?As per HPI ? ?Past Medical History:  ?She,  has a past medical history of Acquired hypothyroidism, Chronic pain syndrome, and HTN (hypertension).  ? ?Critical care time:   ?CRITICAL CARE ? ?Independent CC time 35 minutes ? ?Baltazar Apo, MD, PhD ?03/08/2022, 7:17 AM ? Pulmonary and Critical Care ?[phone] or if no answer before 7:00PM call [phone] ?For any issues after 7:00PM please call eLink [phone] ? ? ? ? ? ? ? ?

## 2022-03-08 NOTE — Progress Notes (Signed)
Pharmacy Antibiotic Note ? ?Sherry Baker is a 56 y.o. female admitted on 03/05/2022.  Pharmacy has been consulted for ceftriaxone dosing. Patient with enterococcus faecalis bacteremia currently on ampicillin. TTE with possible hyper mobile densities on tricuspid valve. Pharmacy consulted to start ceftriaxone for concern of e. Faecalis endocarditis.  ? ?Plan: ?Start ceftriaxone 2g q12h  ?Continue ampicillin  ?Follow up TEE result ? ?Height: '5\' 2"'$  (157.5 cm) ?Weight: 47.1 kg (103 lb 13.4 oz) ?IBW/kg (Calculated) : 50.1 ? ?Temp (24hrs), Avg:98.4 ?F (36.9 ?C), Min:97.1 ?F (36.2 ?C), Max:99.4 ?F (37.4 ?C) ? ?Recent Labs  ?Lab 03/05/22 ?2135 03/06/22 ?7793 03/07/22 ?0220 03/07/22 ?0932 03/08/22 ?0256  ?WBC 6.2 6.1 4.3  --  3.9*  ?CREATININE 1.46* 1.29* 1.30* 1.25* 1.31*  ?LATICACIDVEN 1.7  --   --   --   --   ?  ?Estimated Creatinine Clearance: 36.5 mL/min (A) (by C-G formula based on SCr of 1.31 mg/dL (H)).   ? ?Not on File ? ?Antimicrobials this admission: ?Meropenem 5/4 >> 5/5 ?CTX x1 5/4; 5/6 >> ?Ampicillin 5/5 >> ? ?Dose adjustments this admission: ? ? ?Microbiology results: ?5/4 ucx: neg  ?5/3 bcx: e. Faecalis, staph haemolyticus ?5.5 bcx: ? ?Thank you for allowing pharmacy to be a part of this patient?s care. ? ?Cristela Felt, PharmD, BCPS ?Clinical Pharmacist ?03/08/2022 7:59 AM ? ? ?

## 2022-03-08 NOTE — Progress Notes (Signed)
[facility] ADULT ICU REPLACEMENT PROTOCOL ? ? ?The patient does apply for the [facility] Adult ICU Electrolyte Replacment Protocol based on the criteria listed below:  ? ?1.Exclusion criteria: TCTS patients, ECMO patients, and Dialysis patients ?2. Is GFR >/= 30 ml/min? Yes.    ?Patient's GFR today is 48 ?3. Is SCr </= 2? Yes.   ?Patient's SCr is 1.31 mg/dL ?4. Did SCr increase >/= 0.5 in 24 hours? No. ?5.Pt's weight >40kg  Yes.   ?6. Abnormal electrolyte(s):   K 3.4, Mg 1.6  ?7. Electrolytes replaced per protocol ?8.  Call MD STAT for K+ </= 2.5, Phos </= 1, or Mag </= 1 ?Physician:  S. Sommer ? ?Sherry Baker Sherry Baker 03/08/2022 5:05 AM ? ?

## 2022-03-08 NOTE — Progress Notes (Signed)
PCCM interval note ? ?Patient with improved mental status, Precedex down to 0.3.  Able to follow commands, tolerating PSV well. ? ?Plan to extubate now.  Push pulm hygiene, wean Precedex to off as able. ? ?Independent CC time 15 minutes ? ? ?Baltazar Apo, MD, PhD ?03/08/2022, 5:17 PM ?[facility] ?[phone] or if no answer before 7:00PM call [phone] ?For any issues after 7:00PM please call eLink [phone] ? ?

## 2022-03-08 NOTE — Progress Notes (Signed)
RT suctioned ETT and pt's HR decreased to 44. RN made aware. ?

## 2022-03-09 ENCOUNTER — Inpatient Hospital Stay (HOSPITAL_COMMUNITY): Payer: Medicaid Other

## 2022-03-09 LAB — CULTURE, BLOOD (ROUTINE X 2): Special Requests: ADEQUATE

## 2022-03-09 LAB — HCV INTERPRETATION

## 2022-03-09 LAB — CBC
HCT: 31.3 % — ABNORMAL LOW (ref 36.0–46.0)
Hemoglobin: 9.8 g/dL — ABNORMAL LOW (ref 12.0–15.0)
MCH: 27.8 pg (ref 26.0–34.0)
MCHC: 31.3 g/dL (ref 30.0–36.0)
MCV: 88.9 fL (ref 80.0–100.0)
Platelets: 172 10*3/uL (ref 150–400)
RBC: 3.52 MIL/uL — ABNORMAL LOW (ref 3.87–5.11)
RDW: 19.1 % — ABNORMAL HIGH (ref 11.5–15.5)
WBC: 4.2 10*3/uL (ref 4.0–10.5)
nRBC: 0 % (ref 0.0–0.2)

## 2022-03-09 LAB — BASIC METABOLIC PANEL
Anion gap: 8 (ref 5–15)
BUN: <5 mg/dL — ABNORMAL LOW (ref 6–20)
CO2: 19 mmol/L — ABNORMAL LOW (ref 22–32)
Calcium: 8.4 mg/dL — ABNORMAL LOW (ref 8.9–10.3)
Chloride: 111 mmol/L (ref 98–111)
Creatinine, Ser: 1.17 mg/dL — ABNORMAL HIGH (ref 0.44–1.00)
GFR, Estimated: 55 mL/min — ABNORMAL LOW (ref >60)
Glucose, Bld: 122 mg/dL — ABNORMAL HIGH (ref 70–99)
Potassium: 3.6 mmol/L (ref 3.5–5.1)
Sodium: 138 mmol/L (ref 135–145)

## 2022-03-09 LAB — GLUCOSE, CAPILLARY
Glucose-Capillary: 101 mg/dL — ABNORMAL HIGH (ref 70–99)
Glucose-Capillary: 104 mg/dL — ABNORMAL HIGH (ref 70–99)
Glucose-Capillary: 96 mg/dL (ref 70–99)

## 2022-03-09 LAB — MAGNESIUM: Magnesium: 2.1 mg/dL (ref 1.7–2.4)

## 2022-03-09 LAB — HCV AB W REFLEX TO QUANT PCR: HCV Ab: NONREACTIVE

## 2022-03-09 MED ORDER — FOLIC ACID 1 MG PO TABS
1.0000 mg | ORAL_TABLET | Freq: Every day | ORAL | Status: DC
  Filled 2022-03-09: qty 1

## 2022-03-09 MED ORDER — THIAMINE HCL 100 MG PO TABS: 100.0000 mg | ORAL_TABLET | Freq: Every day | ORAL | Status: DC

## 2022-03-09 MED ORDER — POLYETHYLENE GLYCOL 3350 17 G PO PACK
17.0000 g | PACK | Freq: Every day | ORAL | Status: DC
  Filled 2022-03-09: qty 1

## 2022-03-09 MED ORDER — THIAMINE HCL 100 MG/ML IJ SOLN
100.0000 mg | Freq: Every day | INTRAMUSCULAR | Status: DC
Start: 2022-03-10 — End: 2022-03-10
  Administered 2022-03-10: 100 mg via INTRAVENOUS
  Filled 2022-03-09: qty 2

## 2022-03-09 MED ORDER — DOCUSATE SODIUM 100 MG PO CAPS
100.0000 mg | ORAL_CAPSULE | Freq: Two times a day (BID) | ORAL | Status: DC
  Administered 2022-03-09: 100 mg via ORAL
  Filled 2022-03-09 (×2): qty 1

## 2022-03-09 NOTE — Progress Notes (Addendum)
? ?NAME:  Sherry Baker, MRN:  [id number], DOB:  1966-05-25, LOS: 4 ?ADMISSION DATE:  03/05/2022, CONSULTATION DATE:  03/06/2022 ?REFERRING MD:  Dr. Posey Pronto, CHIEF COMPLAINT:  Agitation   ? ?History of Present Illness:  ?Sherry Baker is a 56 y.o. female with a PMH significant for hypothyroidism, chronic pain syndrome, and HTN who presented to the emergency department overnight 5/3 due to altered mental status.  Per chart review it appears patient has had multiple EMS calls to the resident prior to 5/3 for falls/altered mental status but often was alert enough to refuse transfer to hospital.  However evening of 5/3 patient had additional fall with worsening altered mental status that resulted in recall to EMS and eventual transfer to hospital.  Patient required 5 mg IV Versed with EMS secondary to agitation. ? ?Per chart review and history obtained from family patient has long history with struggle with substance abuse including crack cocaine, PCP, and heavy alcohol use.  Patient typically presents to ED towards the end of the month when finds have expired and this leads to withdrawal symptoms.  Family states it is unlikely that she is currently withdrawing only as she recently received her welfare check May 1. ? ?On ED evaluation this a.m. patient is seen mildly febrile with temperature 100.3, tachypneic with respiratory rate 25-30, tachycardia with heart rate 1 30-1 50, and mild hypertension.  Currently able to protect airway and on room air, however she has snoring respirations on assessment.  ? ?PCCM consulted midmorning of 5/4 for worsening agitation despite aggressive pharmacologic therapy   ? ?Pertinent  Medical History  ?Hypothyroidism ?Chronic pain syndrome ?Polysubstance abuse  ?Hypertension ?Colorectal cancer s/p abdominoperineal resection ? ?Significant Hospital Events: ?Including procedures, antibiotic start and stop dates in addition to other pertinent events   ?5/4 admitted for AMS and multiple falls  ?Extubated  on Precedex 5/6 ? ?Interim History / Subjective:  ?Extubated successfully 5/6 ?Agitation and some hallucinations overnight ?I/O+ 7 L total ?Precedex 1.2 ? ?Objective   ?Blood pressure (!) 136/93, pulse 72, temperature (!) 97 ?F (36.1 ?C), temperature source Axillary, resp. rate (!) 26, height '5\' 2"'$  (1.575 m), weight 47.1 kg, SpO2 96 %. ?   ?Vent Mode: PSV;CPAP ?FiO2 (%):  [30 %] 30 % ?Set Rate:  [12 bmp] 12 bmp ?Vt Set:  [400 mL] 400 mL ?PEEP:  [5 cmH20] 5 cmH20 ?Pressure Support:  [8 cmH20] 8 cmH20  ? ?Intake/Output Summary (Last 24 hours) at 03/09/2022 0745 ?Last data filed at 03/09/2022 0600 ?Gross per 24 hour  ?Intake 3301.35 ml  ?Output 1970 ml  ?Net 1331.35 ml  ? ?Filed Weights  ? 03/06/22 1623 03/07/22 0335 03/08/22 0115  ?Weight: 45.2 kg 43.6 kg 47.1 kg  ? ? ?Examination: ?General: Deconditioned, resting in bed in no acute distress ?HEENT: Oropharynx dry ?Neuro: Sleeping, will wake to vigorous stim, answer some questions but disoriented on Precedex 1.0.  Has had episodes of agitation ?CV: Regular rate, rhythm.  No murmur ?PULM:  Normal respiratory effort, intubated. Clear to ausculation bilaterally. ?GI: Soft, bowel sounds active in all 4 quadrants, non-tender, non-distended ?Extremities: Warm, dry. No peripheral edema. ?Skin: No rashes or lesions ? ?Assessment & Plan:  ?#Severe sepsis 2/2 Enterococcos faecalis, Streptococcus bacteremia with suspected endocarditis ?#Acute respiratory failure  ?#Acute metabolic encephalopathy ?-Push pulmonary hygiene ?-Lighten Precedex as able, suspect multifactorial encephalopathy due to sepsis, possible substance withdrawal ?-Continue ampicillin, ? Stop CTX > will discuss w pharmacy ?-TEE recommended to evaluate endocarditis, planned for Monday 5/8 ? ?#  Chronic kidney disease IIIa ?#Oliguria ?#Hypokalemia, hypomagnesemia ?Per patient's original chart (MRN: [id number]), she has CKDIIIa. Today sCr 1.3, which appears to be her baseline.  ?-Follow urine output, BMP >> her labs  weren't drawn this am (she refused). Will obtain, suspect that she will need K+ replacement ?-Stop maintenance IV fluids 5/7 ?-Replete electrolytes if indicated ? ?#Polysubstance use ?#Alcohol use disorder ?-Continue thiamine, folic acid, MVI ?-Wean Precedex as able ?-She is in a terrible cycle of frequent admissions, leaving AMA.  Needs substance counseling, more effective weaning from substances before she leaves, outpatient assistance ? ?#History colorectal cancer s/p abdominoperineal resection ?Ostomy in place, continues to have good output. ?-Appreciate WOC consult ? ?Best Practice (right click and "Reselect all SmartList Selections" daily)  ? ?Diet/type: NPO ?DVT prophylaxis: LMWH ?GI prophylaxis: PPI ?Lines: N/A ?Foley:  Yes, and it is still needed ?Code Status:  full code ?Last date of multidisciplinary goals of care discussion: [place] discussion pending family discussion  ? ?Labs   ?CBC: ?Recent Labs  ?Lab 03/05/22 ?2135 03/06/22 ?0409 03/06/22 ?0427 03/06/22 ?2409 03/07/22 ?7353 03/08/22 ?0256  ?WBC 6.2 6.1  --   --  4.3 3.9*  ?NEUTROABS 4.8 3.7  --   --   --   --   ?HGB 11.8* 11.2* 11.9* 10.9* 9.1* 9.4*  ?HCT 38.9 36.7 35.0* 32.0* 28.7* 31.2*  ?MCV 92.0 90.2  --   --  87.5 90.7  ?PLT 218 229  --   --  193 184  ? ? ?Basic Metabolic Panel: ?Recent Labs  ?Lab 03/05/22 ?2135 03/06/22 ?0409 03/06/22 ?0427 03/06/22 ?2992 03/07/22 ?4268 03/07/22 ?0932 03/08/22 ?0256  ?NA 148* 147* 147* 147* 143 140 138  ?K 4.3 4.1 4.1 2.9* 2.3* 4.5 3.4*  ?CL 115* 114*  --   --  117* 118* 114*  ?CO2 17* 19*  --   --  20* 17* 18*  ?GLUCOSE 66* 105*  --   --  104* 95 126*  ?BUN 26* 21*  --   --  '13 11 6  '$ ?CREATININE 1.46* 1.29*  --   --  1.30* 1.25* 1.31*  ?CALCIUM 9.6 9.2  --   --  8.5* 8.4* 8.4*  ?MG  --  2.0  1.9  --   --  1.9  --  1.6*  ?PHOS  --  2.8  --   --   --   --  3.0  ? ?GFR: ?Estimated Creatinine Clearance: 36.5 mL/min (A) (by C-G formula based on SCr of 1.31 mg/dL (H)). ?Recent Labs  ?Lab 03/05/22 ?2135 03/06/22 ?0409  03/07/22 ?0220 03/08/22 ?0256  ?WBC 6.2 6.1 4.3 3.9*  ?LATICACIDVEN 1.7  --   --   --   ? ? ?Liver Function Tests: ?Recent Labs  ?Lab 03/05/22 ?2135 03/06/22 ?0409  ?AST 24 24  ?ALT 14 14  ?ALKPHOS 77 73  ?BILITOT 0.9 0.5  ?PROT 7.1 6.9  ?ALBUMIN 3.0* 2.8*  ? ?No results for input(s): LIPASE, AMYLASE in the last 168 hours. ?Recent Labs  ?Lab 03/06/22 ?0409  ?AMMONIA 29  ? ? ?ABG ?   ?Component Value Date/Time  ? PHART 7.310 (L) 03/06/2022 1814  ? PCO2ART 42.7 03/06/2022 1814  ? PO2ART 213 (H) 03/06/2022 1814  ? HCO3 21.3 03/06/2022 1814  ? TCO2 23 03/06/2022 1814  ? ACIDBASEDEF 5.0 (H) 03/06/2022 1814  ? O2SAT 100 03/06/2022 1814  ? ?  ? ?Coagulation Profile: ?Recent Labs  ?Lab 03/06/22 ?0409  ?INR 1.0  ? ? ?Cardiac Enzymes: ?Recent  Labs  ?Lab 03/06/22 ?0409  ?CKTOTAL 84  ? ? ?HbA1C: ?No results found for: HGBA1C ? ?CBG: ?Recent Labs  ?Lab 03/08/22 ?0308 03/08/22 ?3475 03/08/22 ?1053 03/08/22 ?1513 03/08/22 ?2307  ?GLUCAP 131* 129* 118* 128* 103*  ? ? ?Review of Systems:   ?As per HPI ? ?Past Medical History:  ?She,  has a past medical history of Acquired hypothyroidism, Chronic pain syndrome, and HTN (hypertension).  ? ?Critical care time:   ?CRITICAL CARE ? ?Independent CC time 33 minutes ? ?Baltazar Apo, MD, PhD ?03/09/2022, 7:45 AM ?[facility] ?[phone] or if no answer before 7:00PM call [phone] ?For any issues after 7:00PM please call eLink [phone] ? ? ? ? ? ? ? ?

## 2022-03-09 NOTE — Progress Notes (Signed)
PT Cancellation Note ? ?Patient Details ?Name: Sherry Baker ?MRN: [id number] ?DOB: 09-12-1966 ? ? ?Cancelled Treatment:    Reason Eval/Treat Not Completed: (P) Patient not medically ready Pt continues to be on Precedex and is agitated on arousal. PT will follow back tomorrow for Evaluation. ? ?Milliani Herrada B. Migdalia Dk PT, DPT ?Acute Rehabilitation Services ?Please use secure chat or  ?Call Office [phone] ? ? ? ?Royal ?03/09/2022, 8:29 AM ? ? ?

## 2022-03-10 ENCOUNTER — Other Ambulatory Visit (HOSPITAL_COMMUNITY): Payer: Medicaid Other

## 2022-03-10 DIAGNOSIS — E43 Unspecified severe protein-calorie malnutrition: Secondary | ICD-10-CM | POA: Insufficient documentation

## 2022-03-10 DIAGNOSIS — J9601 Acute respiratory failure with hypoxia: Secondary | ICD-10-CM

## 2022-03-10 DIAGNOSIS — G9341 Metabolic encephalopathy: Secondary | ICD-10-CM

## 2022-03-10 DIAGNOSIS — G9340 Encephalopathy, unspecified: Secondary | ICD-10-CM | POA: Diagnosis not present

## 2022-03-10 LAB — BASIC METABOLIC PANEL
Anion gap: 11 (ref 5–15)
Anion gap: 11 (ref 5–15)
BUN: 5 mg/dL — ABNORMAL LOW (ref 6–20)
BUN: <5 mg/dL — ABNORMAL LOW (ref 6–20)
CO2: 18 mmol/L — ABNORMAL LOW (ref 22–32)
CO2: 28 mmol/L (ref 22–32)
Calcium: 8.2 mg/dL — ABNORMAL LOW (ref 8.9–10.3)
Calcium: 6.9 mg/dL — ABNORMAL LOW (ref 8.9–10.3)
Chloride: 109 mmol/L (ref 98–111)
Chloride: 98 mmol/L (ref 98–111)
Creatinine, Ser: 1.27 mg/dL — ABNORMAL HIGH (ref 0.44–1.00)
Creatinine, Ser: 1.16 mg/dL — ABNORMAL HIGH (ref 0.44–1.00)
GFR, Estimated: 50 mL/min — ABNORMAL LOW (ref >60)
GFR, Estimated: 56 mL/min — ABNORMAL LOW (ref >60)
Glucose, Bld: 108 mg/dL — ABNORMAL HIGH (ref 70–99)
Glucose, Bld: 367 mg/dL — ABNORMAL HIGH (ref 70–99)
Potassium: 3.5 mmol/L (ref 3.5–5.1)
Potassium: 3.6 mmol/L (ref 3.5–5.1)
Sodium: 138 mmol/L (ref 135–145)
Sodium: 137 mmol/L (ref 135–145)

## 2022-03-10 LAB — GLUCOSE, CAPILLARY
Glucose-Capillary: 111 mg/dL — ABNORMAL HIGH (ref 70–99)
Glucose-Capillary: 120 mg/dL — ABNORMAL HIGH (ref 70–99)
Glucose-Capillary: 110 mg/dL — ABNORMAL HIGH (ref 70–99)
Glucose-Capillary: 64 mg/dL — ABNORMAL LOW (ref 70–99)
Glucose-Capillary: 73 mg/dL (ref 70–99)
Glucose-Capillary: 104 mg/dL — ABNORMAL HIGH (ref 70–99)

## 2022-03-10 LAB — CULTURE, BLOOD (ROUTINE X 2)
Culture: NO GROWTH
Special Requests: ADEQUATE

## 2022-03-10 LAB — TRIGLYCERIDES: Triglycerides: 94 mg/dL (ref <150)

## 2022-03-10 LAB — MAGNESIUM: Magnesium: 1.7 mg/dL (ref 1.7–2.4)

## 2022-03-10 LAB — PHOSPHORUS: Phosphorus: 4.3 mg/dL (ref 2.5–4.6)

## 2022-03-10 MED ORDER — THIAMINE HCL 100 MG PO TABS: 100.0000 mg | ORAL_TABLET | Freq: Every day | ORAL | Status: DC

## 2022-03-10 MED ORDER — FOLIC ACID 1 MG PO TABS
1.0000 mg | ORAL_TABLET | Freq: Every day | ORAL | Status: DC
  Administered 2022-03-12: 1 mg
  Filled 2022-03-10: qty 1

## 2022-03-10 MED ORDER — REVEFENACIN 175 MCG/3ML IN SOLN
175.0000 ug | Freq: Every day | RESPIRATORY_TRACT | Status: DC
  Administered 2022-03-10 – 2022-03-12 (×3): 175 ug via RESPIRATORY_TRACT
  Filled 2022-03-10 (×3): qty 3

## 2022-03-10 MED ORDER — DOCUSATE SODIUM 50 MG/5ML PO LIQD
100.0000 mg | Freq: Two times a day (BID) | ORAL | Status: DC
Start: 2022-03-10 — End: 2022-03-12
  Administered 2022-03-12: 100 mg
  Filled 2022-03-10: qty 10

## 2022-03-10 MED ORDER — POTASSIUM CHLORIDE 10 MEQ/100ML IV SOLN
10.0000 meq | INTRAVENOUS | Status: AC
  Administered 2022-03-10 – 2022-03-11 (×6): 10 meq via INTRAVENOUS
  Filled 2022-03-10 (×6): qty 100

## 2022-03-10 MED ORDER — IPRATROPIUM-ALBUTEROL 0.5-2.5 (3) MG/3ML IN SOLN: 3.0000 mL | RESPIRATORY_TRACT | Status: DC

## 2022-03-10 MED ORDER — SODIUM BICARBONATE 8.4 % IV SOLN
INTRAVENOUS | Status: DC
  Filled 2022-03-10: qty 1000

## 2022-03-10 MED ORDER — MAGNESIUM SULFATE 4 GM/100ML IV SOLN
4.0000 g | Freq: Once | INTRAVENOUS | Status: AC
  Administered 2022-03-10: 4 g via INTRAVENOUS
  Filled 2022-03-10: qty 100

## 2022-03-10 MED ORDER — IPRATROPIUM-ALBUTEROL 0.5-2.5 (3) MG/3ML IN SOLN: 3.0000 mL | RESPIRATORY_TRACT | Status: DC | PRN

## 2022-03-10 MED ORDER — THIAMINE HCL 100 MG/ML IJ SOLN
100.0000 mg | Freq: Every day | INTRAMUSCULAR | Status: DC
  Administered 2022-03-11 – 2022-03-12 (×2): 100 mg via INTRAVENOUS
  Filled 2022-03-10 (×2): qty 2

## 2022-03-10 MED ORDER — ALBUTEROL SULFATE (2.5 MG/3ML) 0.083% IN NEBU: 2.5000 mg | INHALATION_SOLUTION | RESPIRATORY_TRACT | Status: DC | PRN

## 2022-03-10 MED ORDER — POLYETHYLENE GLYCOL 3350 17 G PO PACK: 17.0000 g | PACK | Freq: Every day | ORAL | Status: DC

## 2022-03-10 MED ORDER — FUROSEMIDE 10 MG/ML IJ SOLN
60.0000 mg | Freq: Two times a day (BID) | INTRAMUSCULAR | Status: DC
  Administered 2022-03-10 – 2022-03-11 (×4): 60 mg via INTRAVENOUS
  Filled 2022-03-10 (×4): qty 6

## 2022-03-10 MED ORDER — VALPROATE SODIUM 100 MG/ML IV SOLN
250.0000 mg | Freq: Two times a day (BID) | INTRAVENOUS | Status: DC
  Administered 2022-03-11 (×2): 250 mg via INTRAVENOUS
  Filled 2022-03-10 (×3): qty 2.5

## 2022-03-10 MED ORDER — VALPROIC ACID 250 MG/5ML PO SOLN: 250.0000 mg | Freq: Two times a day (BID) | ORAL | Status: DC

## 2022-03-10 MED ORDER — BUDESONIDE 0.25 MG/2ML IN SUSP
0.2500 mg | Freq: Two times a day (BID) | RESPIRATORY_TRACT | Status: DC
  Administered 2022-03-10 – 2022-03-12 (×5): 0.25 mg via RESPIRATORY_TRACT
  Filled 2022-03-10 (×5): qty 2

## 2022-03-10 MED ORDER — ARFORMOTEROL TARTRATE 15 MCG/2ML IN NEBU
15.0000 ug | INHALATION_SOLUTION | Freq: Two times a day (BID) | RESPIRATORY_TRACT | Status: DC
  Administered 2022-03-10 – 2022-03-11 (×4): 15 ug via RESPIRATORY_TRACT
  Filled 2022-03-10 (×7): qty 2

## 2022-03-10 MED ORDER — POTASSIUM CHLORIDE 10 MEQ/100ML IV SOLN
10.0000 meq | INTRAVENOUS | Status: AC
  Administered 2022-03-10 (×6): 10 meq via INTRAVENOUS
  Filled 2022-03-10 (×6): qty 100

## 2022-03-10 NOTE — Progress Notes (Signed)
eLink Physician-Brief Progress Note ?Patient Name: Sherry Baker ?DOB: [birth year] ?MRN: [id number] ? ? ?Date of Service ? 03/10/2022  ?HPI/Events of Note ? Severe agitated delirium with risk of falling out of bed. Patient pulled out  her NG tube.  ?eICU Interventions ? Posey restraints ordered. Vaproic acid changed to iv.  ? ? ? ?  ? ?Kerry Kass Malani Lees ?03/10/2022, 11:31 PM ?

## 2022-03-10 NOTE — Progress Notes (Signed)
Initial Nutrition Assessment ? ?DOCUMENTATION CODES:  ? ?Severe malnutrition in context of social or environmental circumstances ? ?INTERVENTION:  ? ?When Cortrak placed, begin TF: ?Osmolite 1.5 at 20 ml/h, increase by 10 ml every 8 hours to goal rate of 40 ml/h (960 ml per day) ?Prosource TF 45 ml BID ? ?Provides 1520 kcal, 82 gm protein, 732 ml free water daily. ? ?When TF initiated, monitor magnesium, potassium, and phosphorus BID for at least 3 days, MD to replete as needed, as pt is at risk for refeeding syndrome given severe malnutrition. ? ?When diet advanced, patient would benefit from PO supplements to maximize protein intake and support wound healing. ? ?NUTRITION DIAGNOSIS:  ? ?Severe Malnutrition related to social / environmental circumstances (polysubstance abuse) as evidenced by severe muscle depletion, severe fat depletion. ? ?Ongoing ? ?GOAL:  ? ?Patient will meet greater than or equal to 90% of their needs ? ?Unmet ? ?MONITOR:  ? ?Diet advancement, PO intake, Labs, TF tolerance, Skin ? ?REASON FOR ASSESSMENT:  ? ?Ventilator ?  ? ?ASSESSMENT:  ? ?56 yo female admitted with encephalopathy, polymicrobial bacteremia, severe sepsis, possible UTI. PMH includes polysubstance abuse, alcohol abuse, colorectal cancer, colostomy, HTN, hypothyroidism, chronic pain syndrome. ? ?Discussed patient in ICU rounds and with RN today. ?Patient was extubated 5/6. ?Diet advanced to regular yesterday afternoon. ?Meal intakes not recorded.  ?Currently NPO for TEE. ?Cortrak feeding tube to be placed Wednesday, after TEE, which is scheduled for tomorrow.  ?Okay to advance diet per discussion with MD when patient is alert enough to take POs.  ? ?Labs reviewed.  ?CBG: [phone] ? ?Medications reviewed and include colace, folic acid, Lasix, MVI with minerals, Miralax, thiamine, Precedex, potassium chloride. ?IVF: D5 with sodium bicarbonate at 50 ml/h. ? ?NUTRITION - FOCUSED PHYSICAL EXAM: ? ?Unable to complete, patient  agitated ? ?Diet Order:   ?Diet Order   ? ?       ?  Diet NPO time specified  Diet effective now       ?  ? ?  ?  ? ?  ? ? ?EDUCATION NEEDS:  ? ?Not appropriate for education at this time ? ?Skin:  Skin Assessment: Skin Integrity Issues: ?Skin Integrity Issues:: Stage IV ?Stage IV: sacrum ? ?Last BM:  5/7 colostomy ? ?Height:  ? ?Ht Readings from Last 1 Encounters:  ?03/06/22 '5\' 2"'$  (1.575 m)  ? ? ?Weight:  ? ?Wt Readings from Last 1 Encounters:  ?03/08/22 47.1 kg  ? ? ?Ideal Body Weight:  50 kg ? ?BMI:  Body mass index is 18.99 kg/m?. ? ?Estimated Nutritional Needs:  ? ?Kcal:  1500-1700 ? ?Protein:  75-90 gm ? ?Fluid:  1.5-1.7 L ? ? ? ?Lucas Mallow RD, LDN, CNSC ?Please refer to Amion for contact information.                                                       ? ?

## 2022-03-10 NOTE — Progress Notes (Addendum)
? ?NAME:  Sherry Baker, MRN:  [id number], DOB:  [birth year], LOS: 5 ?ADMISSION DATE:  03/05/2022, CONSULTATION DATE:  03/06/2022 ?REFERRING MD:  Dr. Posey Pronto, CHIEF COMPLAINT:  Agitation   ? ?History of Present Illness:  ?Sherry Baker is a 56 y.o. female with a PMH significant for hypothyroidism, chronic pain syndrome, and HTN who presented to the emergency department overnight 5/3 due to altered mental status.  Per chart review it appears patient has had multiple EMS calls to the resident prior to 5/3 for falls/altered mental status but often was alert enough to refuse transfer to hospital.  However evening of 5/3 patient had additional fall with worsening altered mental status that resulted in recall to EMS and eventual transfer to hospital.  Patient required 5 mg IV Versed with EMS secondary to agitation. ? ?Per chart review and history obtained from family patient has long history with struggle with substance abuse including crack cocaine, PCP, and heavy alcohol use.  Patient typically presents to ED towards the end of the month when finds have expired and this leads to withdrawal symptoms.  Family states it is unlikely that she is currently withdrawing only as she recently received her welfare check May 1. ? ?On ED evaluation this a.m. patient is seen mildly febrile with temperature 100.3, tachypneic with respiratory rate 25-30, tachycardia with heart rate 1 30-1 50, and mild hypertension.  Currently able to protect airway and on room air, however she has snoring respirations on assessment.  ? ?PCCM consulted midmorning of 5/4 for worsening agitation despite aggressive pharmacologic therapy   ? ?Pertinent  Medical History  ?Hypothyroidism ?Chronic pain syndrome ?Polysubstance abuse  ?Hypertension ?Colorectal cancer s/p abdominoperineal resection ? ?Significant Hospital Events: ?Including procedures, antibiotic start and stop dates in addition to other pertinent events   ?5/4 admitted for AMS and multiple falls  ?Extubated  on Precedex 5/6 ? ?Interim History / Subjective:  ?No acute events overnight. Stable on Precedex.  ? ?Objective   ?Blood pressure (!) 149/82, pulse 71, temperature (!) 97.4 ?F (36.3 ?C), temperature source Axillary, resp. rate (!) 21, height '5\' 2"'$  (1.575 m), weight 47.1 kg, SpO2 95 %. ?   ?   ? ?Intake/Output Summary (Last 24 hours) at 03/10/2022 0711 ?Last data filed at 03/10/2022 0200 ?Gross per 24 hour  ?Intake 1013.47 ml  ?Output 1000 ml  ?Net 13.47 ml  ? ? ?Filed Weights  ? 03/06/22 1623 03/07/22 0335 03/08/22 0115  ?Weight: 45.2 kg 43.6 kg 47.1 kg  ? ? ?Examination: ?General: Deconditioned, resting in bed in no acute distress ?HEENT: Normocephalic, atraumatic. Oropharynx dry ?Neuro: Sedated, will react to painful stimuli, does not follow commands. Moves all four extremities spontaneously.  ?CV: Regular rate, rhythm.  No murmur ?PULM:  Normal respiratory effort on 2L supplemental oxygen. Diffuse expiratory wheezing appreciated. No rales or rhonchi. ?GI: Soft, non-tender, non-distended. Normoactive bowel sounds. Ostomy in place with appropriate stool output.  ?Extremities: Warm, dry. No peripheral edema. ?Skin: No rashes or lesions ? ?Assessment & Plan:  ?#Severe sepsis 2/2 Enterococcos faecalis, Streptococcus bacteremia with suspected endocarditis ?#Acute respiratory failure  ?#Acute metabolic encephalopathy ?Patient hemodynamically stable, not requiring pressors although continues to require Precedex. Will trial weaning this again today.  Infection appears to be improving, no leukocytosis or fevers overnight. R/p BCx negative thus far. Plan for TEE to evaluate for endocarditis today.  ?- Wean Precedex as tolerated ?- Continue ampicillin, ceftriaxone ?- Pending TEE today ? ?#COPD ?No previous PFT's on either of patient's charts. Patient was  previously on triple bronchodilator therapy. This morning, diffuse expiratory wheezing appreciated, no increase in cough or secretions. Will initiate bronchodilator therapy  today. ?- Brovana, Pulmicort, Yupleri nebulizers ?- Albuterol PRN ?- Would benefit from outpatient PFT's ? ?#Severe protein-calorie malnutrition  ?Patient was able to eat small amount of food yesterday. Will try to wean down Precedex today to see if she will eat after TEE. If not tolerating lower dosages of Precedex will have to consider enteral feeding. Albumin 3.0 earlier this admission. Will check Mg, phos this AM.  ? ?#Polysubstance use ?#Alcohol use disorder ?-Continue thiamine, folic acid, MVI ?-Wean Precedex as able ?- Needs outpatient resources, [facility] consult once mental status improves ? ?#Chronic kidney disease IIIa ?Per patient's original chart (MRN: [id number]), she has CKDIIIa.  Renal function stable, is making appropriate urine output. Will f/u electrolyte changes. ?-Daily BMP ?- Strict I/O ? ?#History colorectal cancer s/p abdominoperineal resection ?Ostomy in place, continues to have good output. ?-Appreciate WOC consult ? ?Best Practice (right click and "Reselect all SmartList Selections" daily)  ? ?Diet/type: NPO - if pt unable to eat today, consider enteral feedings ?DVT prophylaxis: LMWH ?GI prophylaxis: N/A ?Lines: N/A ?Foley:  Yes, and it is still needed ?Code Status:  full code ?Last date of multidisciplinary goals of care discussion: [place] discussion pending family discussion  ? ?Labs   ?CBC: ?Recent Labs  ?Lab 03/05/22 ?2135 03/06/22 ?0409 03/06/22 ?0427 03/06/22 ?1814 03/07/22 ?0220 03/08/22 ?0256 03/09/22 ?1020  ?WBC 6.2 6.1  --   --  4.3 3.9* 4.2  ?NEUTROABS 4.8 3.7  --   --   --   --   --   ?HGB 11.8* 11.2* 11.9* 10.9* 9.1* 9.4* 9.8*  ?HCT 38.9 36.7 35.0* 32.0* 28.7* 31.2* 31.3*  ?MCV 92.0 90.2  --   --  87.5 90.7 88.9  ?PLT 218 229  --   --  193 184 172  ? ? ? ?Basic Metabolic Panel: ?Recent Labs  ?Lab 03/06/22 ?0409 03/06/22 ?0427 03/06/22 ?8938 03/07/22 ?0220 03/07/22 ?0932 03/08/22 ?1017 03/09/22 ?1020  ?NA 147*   < > 147* 143 140 138 138  ?K 4.1   < > 2.9* 2.3* 4.5 3.4* 3.6  ?CL 114*  --    --  117* 118* 114* 111  ?CO2 19*  --   --  20* 17* 18* 19*  ?GLUCOSE 105*  --   --  104* 95 126* 122*  ?BUN 21*  --   --  '13 11 6 '$ <5*  ?CREATININE 1.29*  --   --  1.30* 1.25* 1.31* 1.17*  ?CALCIUM 9.2  --   --  8.5* 8.4* 8.4* 8.4*  ?MG 2.0  1.9  --   --  1.9  --  1.6* 2.1  ?PHOS 2.8  --   --   --   --  3.0  --   ? < > = values in this interval not displayed.  ? ? ?GFR: ?Estimated Creatinine Clearance: 40.9 mL/min (A) (by C-G formula based on SCr of 1.17 mg/dL (H)). ?Recent Labs  ?Lab 03/05/22 ?2135 03/06/22 ?0409 03/07/22 ?0220 03/08/22 ?0256 03/09/22 ?1020  ?WBC 6.2 6.1 4.3 3.9* 4.2  ?LATICACIDVEN 1.7  --   --   --   --   ? ? ? ?Liver Function Tests: ?Recent Labs  ?Lab 03/05/22 ?2135 03/06/22 ?0409  ?AST 24 24  ?ALT 14 14  ?ALKPHOS 77 73  ?BILITOT 0.9 0.5  ?PROT 7.1 6.9  ?ALBUMIN 3.0* 2.8*  ? ? ?No  results for input(s): LIPASE, AMYLASE in the last 168 hours. ?Recent Labs  ?Lab 03/06/22 ?0409  ?AMMONIA 29  ? ? ? ?ABG ?   ?Component Value Date/Time  ? PHART 7.310 (L) 03/06/2022 1814  ? PCO2ART 42.7 03/06/2022 1814  ? PO2ART 213 (H) 03/06/2022 1814  ? HCO3 21.3 03/06/2022 1814  ? TCO2 23 03/06/2022 1814  ? ACIDBASEDEF 5.0 (H) 03/06/2022 1814  ? O2SAT 100 03/06/2022 1814  ? ?  ? ?Coagulation Profile: ?Recent Labs  ?Lab 03/06/22 ?0409  ?INR 1.0  ? ? ? ?Cardiac Enzymes: ?Recent Labs  ?Lab 03/06/22 ?0409  ?CKTOTAL 84  ? ? ? ?HbA1C: ?No results found for: HGBA1C ? ?CBG: ?Recent Labs  ?Lab 03/08/22 ?2307 03/09/22 ?1541 03/09/22 ?1910 03/09/22 ?2335 03/10/22 ?0305  ?GLUCAP 103* 104* 96 101* 120*  ? ? ? ?Review of Systems:   ?As per HPI ? ?Past Medical History:  ?She,  has a past medical history of Acquired hypothyroidism, Chronic pain syndrome, and HTN (hypertension).  ? ?Critical care time:   ?CRITICAL CARE ? ?Independent CC time 35 minutes ? ?Sanjuan Dame, MD ?Internal Medicine PGY-2 ?Pager: [phone] ? ? ?

## 2022-03-10 NOTE — Progress Notes (Addendum)
? ?I have seen and examined the patient. I have personally reviewed the clinical findings, laboratory findings, microbiological data and imaging studies. The assessment and treatment plan was discussed with the Nurse Practitioner Mauricio Po. I agree with her/his recommendations except following additions/corrections. ? ?Extubated on 5/6, remains on low dose precedex for agitation and confusion ?Moaning and groaning, does not follow commands cosnsistently, non purposeful movements ?Decubitus sacral ulcer - does not appear to be infected on exam  ? ?Afebrile ?Repeat blood cx 5/3 no growth in 3 days  ? ?Continue IV ampicillin and ceftriaxone as is ?Fu blood cultures  ?Fu TEE ?Do not think imaging of sacrum is needed as sacral ulcer on exam appears to be non infected. Would however, get WOC consult for wound care ?Following  ? ?Rosiland Oz, MD ?Infectious Disease Physician ?[facility] ?[place] [address] ?[place], [place] [address] ?Phone: [phone]  Fax: [phone] ? ? ?[place] [facility] ? ?Date of Admission:  03/05/2022    ? ?Total days of antibiotics 6 ?        ?ASSESSMENT: ? ?Sherry Baker remains encephalopathic. Blood cultures from 03/07/22 have remained without growth to date. Planned for TEE today to rule out endocarditis. Primary team working on weaning Precdex. 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 Staphylococcus hemolyticus likely a contaminant. Monitor blood cultures for continued clearance of bacteremia. Remaining medical and supportive care per primary team.  ? ?PLAN: ? ?Continue ampicillin and ceftriaxone ?TEE  to check for endocarditis.  ?Monitor cultures for clearance of bacteremia.  ?Remaining medical and supportive care per primary team.  ? ?Principal Problem: ?  Acute encephalopathy ?Active Problems: ?  UTI (urinary tract infection) ?  Hypoglycemia ?  Hypernatremia ?   Cocaine abuse ([place]) ?  High anion gap metabolic acidosis ?  Elevated serum creatinine ?  Hypercalcemia ?  Acquired hypothyroidism ?  HTN (hypertension) ?  AMS (altered mental status) ?  Endotracheally intubated ? ? ? arformoterol  15 mcg Nebulization BID  ? artificial tears   Both Eyes BID  ? budesonide (PULMICORT) nebulizer solution  0.25 mg Nebulization BID  ? chlorhexidine gluconate (MEDLINE KIT)  15 mL Mouth Rinse BID  ? cloNIDine  0.1 mg Transdermal Weekly  ? docusate sodium  100 mg Oral BID  ? enoxaparin (LOVENOX) injection  30 mg Subcutaneous Q24H  ? fentaNYL (SUBLIMAZE) injection  50 mcg Intravenous Once  ? folic acid  1 mg Oral Daily  ? leptospermum manuka honey  1 application. Topical Daily  ? mouth rinse  15 mL Mouth Rinse 10 times per day  ? multivitamin with minerals  1 tablet Per Tube Daily  ? mupirocin ointment  1 application. Nasal BID  ? polyethylene glycol  17 g Oral Daily  ? revefenacin  175 mcg Nebulization Daily  ? sodium chloride flush  10-40 mL Intracatheter Q12H  ? thiamine  100 mg Oral Daily  ? Or  ? thiamine  100 mg Intravenous Daily  ? ? ?SUBJECTIVE: ? ?Afebrile overnight with no acute events. Remains encephalopathic although does follow commands on occasion.  ? ?Not on File ? ? ?Review of Systems: ?Review of Systems  ?Unable to perform ROS: Critical illness  ? ? ? ?OBJECTIVE: ?Vitals:  ? 03/10/22 0700 03/10/22 0734 03/10/22 0800 03/10/22 0926  ?BP: (!) 155/88  (!) 160/94   ?Pulse: 69  69 75  ?Resp: (!) 28  (!) 27 (!) 32  ?  Temp:  (!) 97 ?F (36.1 ?C)    ?TempSrc:  Axillary    ?SpO2:   97% 99%  ?Weight:      ?Height:      ? ?Body mass index is 18.99 kg/m?. ? ?Physical Exam ?Constitutional:   ?   General: She is not in acute distress. ?   Appearance: She is well-developed. She is ill-appearing.  ?Cardiovascular:  ?   Rate and Rhythm: Normal rate and regular rhythm.  ?   Heart sounds: Normal heart sounds.  ?Pulmonary:  ?   Effort: Pulmonary effort is normal.  ?   Breath sounds: Normal breath  sounds.  ?Skin: ?   General: Skin is warm and dry.  ?Neurological:  ?   Comments: Does not follow commands - moaning/groaning.   ? ? ?Lab Results ?Lab Results  ?Component Value Date  ? WBC 4.2 03/09/2022  ? HGB 9.8 (L) 03/09/2022  ? HCT 31.3 (L) 03/09/2022  ? MCV 88.9 03/09/2022  ? PLT 172 03/09/2022  ?  ?Lab Results  ?Component Value Date  ? CREATININE 1.27 (H) 03/10/2022  ? BUN 5 (L) 03/10/2022  ? NA 138 03/10/2022  ? K 3.5 03/10/2022  ? CL 109 03/10/2022  ? CO2 18 (L) 03/10/2022  ?  ?Lab Results  ?Component Value Date  ? ALT 14 03/06/2022  ? AST 24 03/06/2022  ? ALKPHOS 73 03/06/2022  ? BILITOT 0.5 03/06/2022  ?  ? ?Microbiology: ?Recent Results (from the past 240 hour(s))  ?Culture, blood (routine x 2)     Status: Abnormal  ? Collection Time: 03/05/22  9:25 PM  ? Specimen: BLOOD  ?Result Value Ref Range Status  ? Specimen Description BLOOD SITE NOT SPECIFIED  Final  ? Special Requests   Final  ?  BOTTLES DRAWN AEROBIC AND ANAEROBIC Blood Culture adequate volume  ? Culture  Setup Time   Final  ?  GRAM POSITIVE COCCI IN CHAINS ?AEROBIC BOTTLE ONLY ?CRITICAL RESULT CALLED TO, READ BACK BY AND VERIFIED WITH: ?PHARMD CARLA JARDIN ON 03/06/22 @ 1559 BY DRT ?  ? Culture (A)  Final  ?  ENTEROCOCCUS FAECALIS ?STAPHYLOCOCCUS HAEMOLYTICUS ?THE SIGNIFICANCE OF ISOLATING THIS ORGANISM FROM A SINGLE SET OF BLOOD CULTURES WHEN MULTIPLE SETS ARE DRAWN IS UNCERTAIN. PLEASE NOTIFY THE MICROBIOLOGY DEPARTMENT WITHIN ONE WEEK IF SPECIATION AND SENSITIVITIES ARE REQUIRED. ?Performed at [facility], [place] [address] [place], [place] [address] ?  ? Report Status 03/09/2022 FINAL  Final  ? Organism ID, Bacteria ENTEROCOCCUS FAECALIS  Final  ?    Susceptibility  ? Enterococcus faecalis - MIC*  ?  AMPICILLIN <=2 SENSITIVE Sensitive   ?  VANCOMYCIN 2 SENSITIVE Sensitive   ?  GENTAMICIN SYNERGY SENSITIVE Sensitive   ?  * ENTEROCOCCUS FAECALIS  ?Blood Culture ID Panel (Reflexed)     Status: Abnormal  ? Collection Time: 03/05/22  9:25 PM   ?Result Value Ref Range Status  ? Enterococcus faecalis DETECTED (A) NOT DETECTED Final  ?  Comment: PHARMD CARLA JARDIN ON 03/06/22 @ 1559 BY DRT  ? Enterococcus Faecium NOT DETECTED NOT DETECTED Final  ? Listeria monocytogenes NOT DETECTED NOT DETECTED Final  ? Staphylococcus species DETECTED (A) NOT DETECTED Final  ?  Comment: CRITICAL RESULT CALLED TO, READ BACK BY AND VERIFIED WITH: ?PHARMD CARLA JARDIN ON 03/06/22 @ 1559 BY DRT ?  ? Staphylococcus aureus (BCID) NOT DETECTED NOT DETECTED Final  ? Staphylococcus epidermidis NOT DETECTED NOT DETECTED Final  ? Staphylococcus lugdunensis NOT  DETECTED NOT DETECTED Final  ? Streptococcus species NOT DETECTED NOT DETECTED Final  ? Streptococcus agalactiae NOT DETECTED NOT DETECTED Final  ? Streptococcus pneumoniae NOT DETECTED NOT DETECTED Final  ? Streptococcus pyogenes NOT DETECTED NOT DETECTED Final  ? A.calcoaceticus-baumannii NOT DETECTED NOT DETECTED Final  ? Bacteroides fragilis NOT DETECTED NOT DETECTED Final  ? Enterobacterales NOT DETECTED NOT DETECTED Final  ? Enterobacter cloacae complex NOT DETECTED NOT DETECTED Final  ? Escherichia coli NOT DETECTED NOT DETECTED Final  ? Klebsiella aerogenes NOT DETECTED NOT DETECTED Final  ? Klebsiella oxytoca NOT DETECTED NOT DETECTED Final  ? Klebsiella pneumoniae NOT DETECTED NOT DETECTED Final  ? Proteus species NOT DETECTED NOT DETECTED Final  ? Salmonella species NOT DETECTED NOT DETECTED Final  ? Serratia marcescens NOT DETECTED NOT DETECTED Final  ? Haemophilus influenzae NOT DETECTED NOT DETECTED Final  ? Neisseria meningitidis NOT DETECTED NOT DETECTED Final  ? Pseudomonas aeruginosa NOT DETECTED NOT DETECTED Final  ? Stenotrophomonas maltophilia NOT DETECTED NOT DETECTED Final  ? Candida albicans NOT DETECTED NOT DETECTED Final  ? Candida auris NOT DETECTED NOT DETECTED Final  ? Candida glabrata NOT DETECTED NOT DETECTED Final  ? Candida krusei NOT DETECTED NOT DETECTED Final  ? Candida parapsilosis NOT DETECTED  NOT DETECTED Final  ? Candida tropicalis NOT DETECTED NOT DETECTED Final  ? Cryptococcus neoformans/gattii NOT DETECTED NOT DETECTED Final  ? Vancomycin resistance NOT DETECTED NOT DETECTED Final  ?  Com

## 2022-03-10 NOTE — Evaluation (Signed)
Occupational Therapy Evaluation ?Patient Details ?Name: Sherry Baker ?MRN: [id number] ?DOB: 26-Apr-1966 ?Today's Date: 03/10/2022 ? ? 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 Intubated 5/4-5/6. PMH including colostomy, chronic pain, HTN, chronic opiod therapy. polysubstance abuse  ? ?Clinical Impression ?  ?Pt unable to provide information about PLOF and home support due to poor historian and cognition. Pt currently requiring Total A for ADLs and bed mobility.  Pt fluctuating between restlessness and lethargy. Pt with poor strength, balance, cognition, arousal, and safety. Pt would benefit from further acute OT to facilitate safe dc. Recommend dc to SNF for further OT to optimize safety, independence with ADLs, and return to PLOF.  ?   ? ?Recommendations for follow up therapy are one component of a multi-disciplinary discharge planning process, led by the attending physician.  Recommendations may be updated based on patient status, additional functional criteria and insurance authorization.  ? ?Follow Up Recommendations ? Skilled nursing-short term rehab (<3 hours/day)  ?  ?Assistance Recommended at Discharge Frequent or constant Supervision/Assistance  ?Patient can return home with the following  (Total A) ? ?  ?Functional Status Assessment ? Patient has had a recent decline in their functional status and demonstrates the ability to make significant improvements in function in a reasonable and predictable amount of time.  ?Equipment Recommendations ? Other (comment) (Defer to next venue)  ?  ?Recommendations for Other Services   ? ? ?  ?Precautions / Restrictions Precautions ?Precautions: Fall  ? ?  ? ?Mobility Bed Mobility ?Overal bed mobility: Needs Assistance ?Bed Mobility: Sidelying to Sit, Rolling, Sit to Supine ?Rolling: Total assist ?Sidelying to sit: +2 for physical assistance, Total assist ?  ?Sit to supine: +2 for  physical assistance, Total assist ?  ?General bed mobility comments: Assist for all aspects due to cognition and weakness. ?  ? ?Transfers ?Overall transfer level: Needs assistance ?  ?  ?  ?  ?  ?  ?  ?  ?General transfer comment: Attempted to stand with +2 hand held assist but pt unable to clear buttocks from bed. Pt with buttocks propped on bed and legs partially extended. ?  ? ?  ?Balance Overall balance assessment: Needs assistance ?Sitting-balance support: Bilateral upper extremity supported, Feet supported ?Sitting balance-Leahy Scale: Poor ?Sitting balance - Comments: Pt required mod assist to sit EOB due to inconsistentunpredictable perfomance and head bobbing. ?Postural control: Posterior lean ?  ?  ?  ?  ?  ?  ?  ?  ?  ?  ?  ?  ?  ?  ?   ? ?ADL either performed or assessed with clinical judgement  ? ?ADL Overall ADL's : Needs assistance/impaired ?  ?  ?  ?  ?  ?  ?  ?  ?  ?  ?  ?  ?  ?  ?  ?  ?  ?  ?  ?General ADL Comments: Total A +2  ? ? ? ?Vision   ?   ?   ?Perception   ?  ?Praxis   ?  ? ?Pertinent Vitals/Pain Pain Assessment ?Pain Assessment: Faces ?Faces Pain Scale: Hurts even more ?Pain Location: buttocks ?Pain Descriptors / Indicators: Grimacing, Guarding ?Pain Intervention(s): Monitored during session, Repositioned, Limited activity within patient's tolerance  ? ? ? ?Hand Dominance   ?  ?Extremity/Trunk Assessment Upper Extremity Assessment ?Upper Extremity Assessment: Generalized weakness ?  ?Lower  Extremity Assessment ?Lower Extremity Assessment: Defer to PT evaluation ?  ?Cervical / Trunk Assessment ?Cervical / Trunk Assessment: Kyphotic ?  ?Communication Communication ?Communication: Expressive difficulties (Mumbling, difficult to understand) ?  ?Cognition Arousal/Alertness: Lethargic (fluctuating between restless and lethargic) ?Behavior During Therapy: Restless, Impulsive ?Overall Cognitive Status: Impaired/Different from baseline ?Area of Impairment: Orientation, Attention, Memory,  Following commands, Safety/judgement, Awareness, Problem solving ?  ?  ?  ?  ?  ?  ?  ?  ?Orientation Level: Disoriented to, Place, Time, Situation ?Current Attention Level: Focused ?Memory: Decreased short-term memory, Decreased recall of precautions ?Following Commands: Follows one step commands inconsistently, Follows one step commands with increased time ?Safety/Judgement: Decreased awareness of safety, Decreased awareness of deficits ?Awareness: Intellectual ?Problem Solving: Slow processing, Difficulty sequencing, Requires verbal cues, Requires tactile cues ?General Comments: Tangietal. Poor attention. Inconsistent following of commands. Flucuating between restless and lethargy ?  ?  ?General Comments  VSS on 4L ? ?  ?Exercises   ?  ?Shoulder Instructions    ? ? ?Home Living Family/patient expects to be discharged to:: Private residence ?Living Arrangements: Non-relatives/Friends ?Available Help at Discharge: Friend(s);Available PRN/intermittently ?Type of Home: Apartment ?Home Access: Level entry ?  ?  ?Home Layout: One level ?  ?  ?  ?  ?  ?  ?  ?Home Equipment: Conservation officer, nature (2 wheels);Rollator (4 wheels) ?  ?Additional Comments: Pt unable to provide information. Chart review for information. ?  ? ?  ?Prior Functioning/Environment Prior Level of Function : Patient poor historian/Family not available ?  ?  ?  ?  ?  ?  ?Mobility Comments: Per notes pt modified independent with walker or rollator ?ADLs Comments: Pt unable to provide information ?  ? ?  ?  ?OT Problem List: Decreased strength;Decreased range of motion;Decreased activity tolerance;Impaired balance (sitting and/or standing);Decreased knowledge of use of DME or AE;Decreased knowledge of precautions ?  ?   ?OT Treatment/Interventions: Self-care/ADL training;Therapeutic exercise;Energy conservation;DME and/or AE instruction;Therapeutic activities;Patient/family education  ?  ?OT Goals(Current goals can be found in the care plan section) Acute Rehab  OT Goals ?Patient Stated Goal: Unstated ?OT Goal Formulation: Patient unable to participate in goal setting ?Time For Goal Achievement: 03/24/22 ?Potential to Achieve Goals: Fair  ?OT Frequency: Min 2X/week ?  ? ?Co-evaluation PT/OT/SLP Co-Evaluation/Treatment: Yes ?Reason for Co-Treatment: For patient/therapist safety;To address functional/ADL transfers ?PT goals addressed during session: Mobility/safety with mobility;Balance ?OT goals addressed during session: ADL's and self-care ?  ? ?  ?AM-PAC OT "6 Clicks" Daily Activity     ?Outcome Measure Help from another person eating meals?: Total ?Help from another person taking care of personal grooming?: Total ?Help from another person toileting, which includes using toliet, bedpan, or urinal?: Total ?Help from another person bathing (including washing, rinsing, drying)?: Total ?Help from another person to put on and taking off regular upper body clothing?: Total ?Help from another person to put on and taking off regular lower body clothing?: Total ?6 Click Score: 6 ?  ?End of Session Equipment Utilized During Treatment: Oxygen ?Nurse Communication: Mobility status ? ?Activity Tolerance: Patient tolerated treatment well ?Patient left: in bed;with call bell/phone within reach;with bed alarm set ? ?OT Visit Diagnosis: Unsteadiness on feet (R26.81);Other abnormalities of gait and mobility (R26.89);Muscle weakness (generalized) (M62.81)  ?              ?Time: 9937-1696 ?OT Time Calculation (min): 19 min ?Charges:  OT General Charges ?$OT Visit: 1 Visit ?OT Evaluation ?$OT Eval Moderate Complexity: 1 Mod ? ?  Gizel Riedlinger MSOT, OTR/L ?Acute Rehab ?Pager: [phone] ?Office: [phone] ? ?Tinsley Everman M [facility] ?03/10/2022, 4:14 PM ?

## 2022-03-10 NOTE — Evaluation (Signed)
Physical Therapy Evaluation ?Patient Details ?Name: Sherry Baker ?MRN: [id number] ?DOB: [birth year] ?Today's Date: 03/10/2022 ? ?History of Present Illness ? 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 Intubated 5/4-5/6. PMH - colostomy, chronic pain, htn, chronic opiod therapy. polysubstance abuse  ?Clinical Impression ? Pt presents to PT with severe limitations in mobility due to poor cognition, poor balance, and weakness. Most information on prior level of function obtained from prior encounters. Limited home support and recommend SNF at DC.    ?   ? ?Recommendations for follow up therapy are one component of a multi-disciplinary discharge planning process, led by the attending physician.  Recommendations may be updated based on patient status, additional functional criteria and insurance authorization. ? ?Follow Up Recommendations Skilled nursing-short term rehab (<3 hours/day) ? ?  ?Assistance Recommended at Discharge Frequent or constant Supervision/Assistance  ?Patient can return home with the following ? Two people to help with walking and/or transfers;Two people to help with bathing/dressing/bathroom;Direct supervision/assist for financial management;Direct supervision/assist for medications management;Assistance with cooking/housework;Help with stairs or ramp for entrance;Assist for transportation ? ?  ?Equipment Recommendations None recommended by PT  ?Recommendations for Other Services ?    ?  ?Functional Status Assessment Patient has had a recent decline in their functional status and demonstrates the ability to make significant improvements in function in a reasonable and predictable amount of time.  ? ?  ?Precautions / Restrictions Precautions ?Precautions: Fall  ? ?  ? ?Mobility ? Bed Mobility ?Overal bed mobility: Needs Assistance ?Bed Mobility: Sidelying to Sit, Rolling, Sit to Supine ?Rolling: Total assist ?Sidelying to sit: +2 for physical assistance, Total  assist ?  ?Sit to supine: +2 for physical assistance, Total assist ?  ?General bed mobility comments: Assist for all aspects due to cognition and weakness. ?  ? ?Transfers ?Overall transfer level: Needs assistance ?  ?  ?  ?  ?  ?  ?  ?  ?General transfer comment: Attempted to stand with +2 hand held assist but pt unable to clear buttocks from bed. Pt with buttocks propped on bed and legs partially extended. ?  ? ?Ambulation/Gait ?  ?  ?  ?  ?  ?  ?  ?  ? ?Stairs ?  ?  ?  ?  ?  ? ?Wheelchair Mobility ?  ? ?Modified Rankin (Stroke Patients Only) ?  ? ?  ? ?Balance Overall balance assessment: Needs assistance ?Sitting-balance support: Bilateral upper extremity supported, Feet supported ?Sitting balance-Leahy Scale: Poor ?Sitting balance - Comments: Pt required mod assist to sit EOB due to inconsistentunpredictable perfomance and head bobbing. ?Postural control: Posterior lean ?  ?  ?  ?  ?  ?  ?  ?  ?  ?  ?  ?  ?  ?  ?   ? ? ? ?Pertinent Vitals/Pain Pain Assessment ?Pain Assessment: Faces ?Faces Pain Scale: Hurts even more ?Pain Location: buttocks ?Pain Descriptors / Indicators: Grimacing, Guarding ?Pain Intervention(s): Limited activity within patient's tolerance, Repositioned  ? ? ?Home Living Family/patient expects to be discharged to:: Private residence ?Living Arrangements: Non-relatives/Friends ?Available Help at Discharge: Friend(s);Available PRN/intermittently ?Type of Home: Apartment ?Home Access: Level entry ?  ?  ?  ?Home Layout: One level ?Home Equipment: Conservation officer, nature (2 wheels);Rollator (4 wheels) ?Additional Comments: Information gathered from prior encounter. Pt unable  ?  ?Prior Function Prior Level of Function : Patient poor historian/Family not available ?  ?  ?  ?  ?  ?  ?  Mobility Comments: Per notes pt modified independent with walker or rollator ?  ?  ? ? ?Hand Dominance  ?   ? ?  ?Extremity/Trunk Assessment  ? Upper Extremity Assessment ?Upper Extremity Assessment: Defer to OT evaluation ?   ? ?Lower Extremity Assessment ?Lower Extremity Assessment: Generalized weakness ?  ? ?Cervical / Trunk Assessment ?Cervical / Trunk Assessment: Kyphotic  ?Communication  ? Communication: Expressive difficulties (Mumbling, difficult to understand)  ?Cognition Arousal/Alertness: Lethargic (fluctuating between restless and lethargic) ?Behavior During Therapy: Restless, Impulsive ?Overall Cognitive Status: Impaired/Different from baseline ?Area of Impairment: Orientation, Attention, Memory, Following commands, Safety/judgement, Awareness, Problem solving ?  ?  ?  ?  ?  ?  ?  ?  ?Orientation Level: Disoriented to, Place, Time, Situation ?Current Attention Level: Focused ?Memory: Decreased short-term memory, Decreased recall of precautions ?Following Commands: Follows one step commands inconsistently, Follows one step commands with increased time ?Safety/Judgement: Decreased awareness of safety, Decreased awareness of deficits ?Awareness: Intellectual ?Problem Solving: Slow processing, Difficulty sequencing, Requires verbal cues, Requires tactile cues ?  ?  ?  ? ?  ?General Comments General comments (skin integrity, edema, etc.): VSS on O2 ? ?  ?Exercises    ? ?Assessment/Plan  ?  ?PT Assessment Patient needs continued PT services  ?PT Problem List Decreased strength;Decreased activity tolerance;Decreased balance;Decreased mobility;Decreased cognition;Decreased safety awareness ? ?   ?  ?PT Treatment Interventions DME instruction;Gait training;Functional mobility training;Therapeutic activities;Therapeutic exercise;Balance training;Cognitive remediation;Patient/family education   ? ?PT Goals (Current goals can be found in the Care Plan section)  ?Acute Rehab PT Goals ?PT Goal Formulation: Patient unable to participate in goal setting ?Time For Goal Achievement: 03/24/22 ?Potential to Achieve Goals: Fair ? ?  ?Frequency Min 3X/week ?  ? ? ?Co-evaluation PT/OT/SLP Co-Evaluation/Treatment: Yes ?Reason for Co-Treatment: For  patient/therapist safety;Necessary to address cognition/behavior during functional activity ?PT goals addressed during session: Mobility/safety with mobility;Balance ?  ?  ? ? ?  ?AM-PAC PT "6 Clicks" Mobility  ?Outcome Measure Help needed turning from your back to your side while in a flat bed without using bedrails?: A Lot ?Help needed moving from lying on your back to sitting on the side of a flat bed without using bedrails?: Total ?Help needed moving to and from a bed to a chair (including a wheelchair)?: Total ?Help needed standing up from a chair using your arms (e.g., wheelchair or bedside chair)?: Total ?Help needed to walk in hospital room?: Total ?Help needed climbing 3-5 steps with a railing? : Total ?6 Click Score: 7 ? ?  ?End of Session Equipment Utilized During Treatment: Oxygen ?Activity Tolerance: Patient limited by fatigue ?Patient left: in bed;with call bell/phone within reach;with bed alarm set ?Nurse Communication: Mobility status ?PT Visit Diagnosis: Other abnormalities of gait and mobility (R26.89);Muscle weakness (generalized) (M62.81);History of falling (Z91.81) ?  ? ?Time: 6295-2841 ?PT Time Calculation (min) (ACUTE ONLY): 19 min ? ? ?Charges:   PT Evaluation ?$PT Eval Moderate Complexity: 1 Mod ?  ?  ?   ? ? ?[facility] PT ?Acute Rehabilitation Services ?Office [phone] ? ? ?Shary Decamp [facility] ?03/10/2022, 3:13 PM ? ?

## 2022-03-10 NOTE — TOC Progression Note (Signed)
Transition of Care (TOC) - Progression Note  ? ? ?Patient Details  ?Name: Sherry Baker ?MRN: [id number] ?Date of Birth: 1966/05/14 ? ?Transition of Care (TOC) CM/SW Contact  ?Bartholomew Crews, RN ?Phone Number: [phone] ?03/10/2022, 1:33 PM ? ?Clinical Narrative:    ? ?TOC following for transition needs through chart review and progression rounds.  ? ?  ?  ? ?Expected Discharge Plan and Services ?  ?  ?  ?  ?  ?                ?  ?  ?  ?  ?  ?  ?  ?  ?  ?  ? ? ?Social Determinants of Health (SDOH) Interventions ?  ? ?Readmission Risk Interventions ?   ? View : No data to display.  ?  ?  ?  ? ? ?

## 2022-03-11 ENCOUNTER — Encounter (HOSPITAL_COMMUNITY): Payer: Self-pay | Admitting: Certified Registered Nurse Anesthetist

## 2022-03-11 ENCOUNTER — Inpatient Hospital Stay (HOSPITAL_COMMUNITY): Payer: Medicaid Other

## 2022-03-11 DIAGNOSIS — R7881 Bacteremia: Secondary | ICD-10-CM

## 2022-03-11 DIAGNOSIS — L8990 Pressure ulcer of unspecified site, unspecified stage: Secondary | ICD-10-CM | POA: Insufficient documentation

## 2022-03-11 DIAGNOSIS — I340 Nonrheumatic mitral (valve) insufficiency: Secondary | ICD-10-CM

## 2022-03-11 LAB — CBC WITH DIFFERENTIAL/PLATELET
Abs Immature Granulocytes: 0.02 10*3/uL (ref 0.00–0.07)
Basophils Absolute: 0 10*3/uL (ref 0.0–0.1)
Basophils Relative: 0 %
Eosinophils Absolute: 0.1 10*3/uL (ref 0.0–0.5)
Eosinophils Relative: 4 %
HCT: 32.5 % — ABNORMAL LOW (ref 36.0–46.0)
Hemoglobin: 10.7 g/dL — ABNORMAL LOW (ref 12.0–15.0)
Immature Granulocytes: 1 %
Lymphocytes Relative: 23 %
Lymphs Abs: 0.8 10*3/uL (ref 0.7–4.0)
MCH: 27.3 pg (ref 26.0–34.0)
MCHC: 32.9 g/dL (ref 30.0–36.0)
MCV: 82.9 fL (ref 80.0–100.0)
Monocytes Absolute: 0.6 10*3/uL (ref 0.1–1.0)
Monocytes Relative: 16 %
Neutro Abs: 2 10*3/uL (ref 1.7–7.7)
Neutrophils Relative %: 56 %
Platelets: 185 10*3/uL (ref 150–400)
RBC: 3.92 MIL/uL (ref 3.87–5.11)
RDW: 18.3 % — ABNORMAL HIGH (ref 11.5–15.5)
WBC: 3.6 10*3/uL — ABNORMAL LOW (ref 4.0–10.5)
nRBC: 0 % (ref 0.0–0.2)

## 2022-03-11 LAB — BASIC METABOLIC PANEL
Anion gap: 10 (ref 5–15)
BUN: <5 mg/dL — ABNORMAL LOW (ref 6–20)
CO2: 25 mmol/L (ref 22–32)
Calcium: 8.1 mg/dL — ABNORMAL LOW (ref 8.9–10.3)
Chloride: 101 mmol/L (ref 98–111)
Creatinine, Ser: 1.37 mg/dL — ABNORMAL HIGH (ref 0.44–1.00)
GFR, Estimated: 46 mL/min — ABNORMAL LOW (ref >60)
Glucose, Bld: 117 mg/dL — ABNORMAL HIGH (ref 70–99)
Potassium: 4.1 mmol/L (ref 3.5–5.1)
Sodium: 136 mmol/L (ref 135–145)

## 2022-03-11 LAB — GLUCOSE, CAPILLARY
Glucose-Capillary: 105 mg/dL — ABNORMAL HIGH (ref 70–99)
Glucose-Capillary: 110 mg/dL — ABNORMAL HIGH (ref 70–99)
Glucose-Capillary: 116 mg/dL — ABNORMAL HIGH (ref 70–99)
Glucose-Capillary: 117 mg/dL — ABNORMAL HIGH (ref 70–99)
Glucose-Capillary: 131 mg/dL — ABNORMAL HIGH (ref 70–99)
Glucose-Capillary: 82 mg/dL (ref 70–99)

## 2022-03-11 LAB — PHOSPHORUS
Phosphorus: 3.6 mg/dL (ref 2.5–4.6)
Phosphorus: 4.6 mg/dL (ref 2.5–4.6)

## 2022-03-11 LAB — MAGNESIUM
Magnesium: 3.3 mg/dL — ABNORMAL HIGH (ref 1.7–2.4)
Magnesium: 2.9 mg/dL — ABNORMAL HIGH (ref 1.7–2.4)

## 2022-03-11 LAB — METHYLMALONIC ACID, SERUM: Methylmalonic Acid, Quantitative: 258 nmol/L (ref 0–378)

## 2022-03-11 MED ORDER — NOREPINEPHRINE 4 MG/250ML-% IV SOLN
INTRAVENOUS | Status: AC
  Filled 2022-03-11: qty 250

## 2022-03-11 MED ORDER — VITAL HIGH PROTEIN PO LIQD: 1000.0000 mL | ORAL | Status: DC

## 2022-03-11 MED ORDER — FENTANYL CITRATE (PF) 100 MCG/2ML IJ SOLN: 100.0000 ug | Freq: Once | INTRAMUSCULAR | Status: AC

## 2022-03-11 MED ORDER — MIDAZOLAM HCL 2 MG/2ML IJ SOLN
4.0000 mg | Freq: Once | INTRAMUSCULAR | Status: AC
  Administered 2022-03-11: 4 mg via INTRAVENOUS
  Filled 2022-03-11: qty 4

## 2022-03-11 MED ORDER — VALPROATE SODIUM 100 MG/ML IV SOLN
250.0000 mg | Freq: Once | INTRAVENOUS | Status: AC
  Administered 2022-03-11: 250 mg via INTRAVENOUS
  Filled 2022-03-11: qty 2.5

## 2022-03-11 MED ORDER — SODIUM CHLORIDE 0.9 % IV SOLN: INTRAVENOUS | Status: DC

## 2022-03-11 MED ORDER — PROSOURCE TF PO LIQD: 45.0000 mL | Freq: Two times a day (BID) | ORAL | Status: DC

## 2022-03-11 MED ORDER — CHLORHEXIDINE GLUCONATE CLOTH 2 % EX PADS
6.0000 | MEDICATED_PAD | Freq: Every day | CUTANEOUS | Status: DC
  Administered 2022-03-11 – 2022-03-15 (×4): 6 via TOPICAL

## 2022-03-11 MED ORDER — VALPROATE SODIUM 100 MG/ML IV SOLN
500.0000 mg | Freq: Two times a day (BID) | INTRAVENOUS | Status: DC
  Administered 2022-03-11 – 2022-03-12 (×2): 500 mg via INTRAVENOUS
  Filled 2022-03-11 (×3): qty 5

## 2022-03-11 MED ORDER — FENTANYL CITRATE (PF) 100 MCG/2ML IJ SOLN
INTRAMUSCULAR | Status: AC
  Administered 2022-03-11: 100 ug
  Filled 2022-03-11: qty 2

## 2022-03-11 MED ORDER — FENTANYL CITRATE (PF) 100 MCG/2ML IJ SOLN
50.0000 ug | Freq: Once | INTRAMUSCULAR | Status: AC
  Filled 2022-03-11: qty 2

## 2022-03-11 NOTE — Progress Notes (Signed)
eLink Physician-Brief Progress Note ?Patient Name: Sherry Baker ?DOB: 30-Mar-1966 ?MRN: [id number] ? ? ?Date of Service ? 03/11/2022  ?HPI/Events of Note ? Temp 94.5  ?eICU Interventions ? [employer] [facility] ordered.  ? ? ? ?  ? ?Kerry Kass Telford Archambeau ?03/11/2022, 3:35 AM ?

## 2022-03-11 NOTE — Procedures (Signed)
Moderate sedation: ? ?Time out performed.  ?Versed '4mg'$  ?Fentanyl 147mg given in divided doses. ?Precedex infusion increased to 2.0 mcg/kg/hr. ? ?I directly monitored her hemodynamics and oxygenation throughout the procedure. Procedure completed without complications. Precedex decreased to 1.0 mcg/kg/hr after completion. Moderate sedation time: 15 min. ? ?LJulian Hy DO 03/11/22 12:35 PM ?[facility] ? ? ?

## 2022-03-11 NOTE — Progress Notes (Signed)
?  Echocardiogram ?Echocardiogram Transesophageal has been performed. ? ?Fidel Levy ?03/11/2022, 11:42 AM ?

## 2022-03-11 NOTE — CV Procedure (Signed)
? ? ?  TRANSESOPHAGEAL ECHOCARDIOGRAM  ? ?NAME:  Sherry Baker   MRN: [id number] ?DOB:  Jan 03, 1966   ADMIT DATE: 03/05/2022 ? ?INDICATIONS: ? ?Bacteremia ? ?PROCEDURE:  ? ?Informed consent was obtained prior to the procedure. The risks, benefits and alternatives for the procedure were discussed and the patient comprehended these risks.  Risks include, but are not limited to, cough, sore throat, vomiting, nausea, somnolence, esophageal and stomach trauma or perforation, bleeding, low blood pressure, aspiration, pneumonia, infection, trauma to the teeth and death.   ? ?After a procedural time-out, the patient was sedated by Dr. Carlis Abbott.  The transesophageal probe was inserted in the esophagus and stomach without difficulty and multiple views were obtained.  ? ? ?COMPLICATIONS:   ? ?There were no immediate complications. ? ?FINDINGS: ? ?LEFT VENTRICLE: EF = 40-45%. Global Hk ? ?RIGHT VENTRICLE: Normal size and function.  ? ?LEFT ATRIUM: Moderately dilated ? ?LEFT ATRIAL APPENDAGE: No thrombus.  ? ?RIGHT ATRIUM: Normal ? ?AORTIC VALVE:  Trileaflet.  No AS/AI. No vegetation ? ?MITRAL VALVE:    Normal. Mild MR. No vegetation. ? ?TRICUSPID VALVE: Normal. Trivial TR. No vegetation. ? ?PULMONIC VALVE: Grossly normal. Trivial PI  No vegetation  ? ?INTERATRIAL SEPTUM: No PFO or ASD. ? ?PERICARDIUM: No effusion ? ?DESCENDING AORTA: Not well visualized ? ? ?CONCLUSION: ? ?No TEE evidence of endocarditis.  ? ?Glori Bickers, MD  ?11:36 AM ? ? ?Ria Redcay,MD ?11:34 AM ? ? ?

## 2022-03-11 NOTE — Progress Notes (Addendum)
ID Brief Note  ? ?Afebrile ?PCCM continuing to wean down on precedex with some improvement in mental status ? ?TEE prelim with no vegetations or endocarditis  ? ?E faecalis was only in 1/4 bottles along with staph epidermidis. This could be a contaminated specimen as well but ED presentation with concerns for sepsis/UTI/Sacral ulcer. ? ?Recommend to DC ceftriaxone ?Ok to transition IV ampicillin to PO amoxicillin if able to take PO ?Duration 10-14 days total( IV and PO) ?D/w ID pharm D ?ID available as needed, please call with questions  ? ?Rosiland Oz, MD ?Infectious Disease Physician ?[facility] ?[place] [address] ?[place], [place] [address] ?Phone: [phone]  Fax: [phone] ? ?

## 2022-03-11 NOTE — Progress Notes (Addendum)
? ?NAME:  Sherry Baker, MRN:  [id number], DOB:  Dec 05, 1965, LOS: 6 ?ADMISSION DATE:  03/05/2022, CONSULTATION DATE:  03/06/2022 ?REFERRING MD:  Dr. Posey Pronto, CHIEF COMPLAINT:  Agitation   ? ?History of Present Illness:  ?Sherry Baker is a 56 y.o. female with a PMH significant for hypothyroidism, chronic pain syndrome, and HTN who presented to the emergency department overnight 5/3 due to altered mental status.  Per chart review it appears patient has had multiple EMS calls to the resident prior to 5/3 for falls/altered mental status but often was alert enough to refuse transfer to hospital.  However evening of 5/3 patient had additional fall with worsening altered mental status that resulted in recall to EMS and eventual transfer to hospital.  Patient required 5 mg IV Versed with EMS secondary to agitation. ? ?Per chart review and history obtained from family patient has long history with struggle with substance abuse including crack cocaine, PCP, and heavy alcohol use.  Patient typically presents to ED towards the end of the month when finds have expired and this leads to withdrawal symptoms.  Family states it is unlikely that she is currently withdrawing only as she recently received her welfare check May 1. ? ?On ED evaluation this a.m. patient is seen mildly febrile with temperature 100.3, tachypneic with respiratory rate 25-30, tachycardia with heart rate 1 30-1 50, and mild hypertension.  Currently able to protect airway and on room air, however she has snoring respirations on assessment.  ? ?PCCM consulted midmorning of 5/4 for worsening agitation despite aggressive pharmacologic therapy   ? ?Pertinent  Medical History  ?Hypothyroidism ?Chronic pain syndrome ?Polysubstance abuse  ?Hypertension ?Colorectal cancer s/p abdominoperineal resection ? ?Significant Hospital Events: ?Including procedures, antibiotic start and stop dates in addition to other pertinent events   ?5/4 admitted for AMS and multiple falls  ?Extubated  on Precedex 5/6 ? ?Interim History / Subjective:  ?No acute events overnight. Continuing to wean down on Precedex, mental status mildly improving. ? ?Objective   ?Blood pressure 133/86, pulse 62, temperature 98.6 ?F (37 ?C), resp. rate (!) 26, height '5\' 2"'$  (1.575 m), weight 46 kg, SpO2 100 %. ?   ?   ? ?Intake/Output Summary (Last 24 hours) at 03/11/2022 0851 ?Last data filed at 03/11/2022 8413 ?Gross per 24 hour  ?Intake 3368.97 ml  ?Output 8805 ml  ?Net -5436.03 ml  ? ? ?Filed Weights  ? 03/07/22 0335 03/08/22 0115 03/11/22 0400  ?Weight: 43.6 kg 47.1 kg 46 kg  ? ? ?Examination: ?General: Deconditioned, resting in bed in no acute distress ?HEENT: Normocephalic, atraumatic. Oropharynx dry ?Neuro: Sedated although more awake than yesterday, will react to painful stimuli, does not follow commands. Moves all four extremities spontaneously.  ?CV: Regular rate, rhythm.  No murmurs ?PULM:  Normal respiratory effort on 2L supplemental oxygen. No wheezing appreciated. ?GI: Soft, non-tender, non-distended. Normoactive bowel sounds. Ostomy in place with appropriate stool output.  ?Extremities: Warm, dry. No peripheral edema. ?Skin: No rashes or lesions ? ?Assessment & Plan:  ?#Severe sepsis 2/2 Enterococcos faecalis, Streptococcus bacteremia with suspected endocarditis ?#Acute respiratory failure  ?#Acute metabolic encephalopathy 2/2 infection ?Patient hemodynamically stable, not requiring pressors. Continues to need some Precedex, trying to wean this down as tolerated. Leukocytosis has resolved. R/p BCx still NGTD.  Plan for TEE to evaluate for endocarditis tomorrow. Per ID will defer imaging on pelvis, appreciate their recommendations.  Will need cortrak for nutritional needs. ?- Wean Precedex as tolerated ?- Continue ampicillin, ceftriaxone ?- Pending TEE today,  cortrak and TF afterward ? ?#COPD ?No previous PFT's on either of patient's charts. Patient was previously on triple bronchodilator therapy. Wheezing from yesterday  has resolved, lungs clear this morning. Will continue with current regimen.  ?- Brovana, Pulmicort, Yupleri nebulizers ?- DuoNebs q4h, albuterol PRN ?- Would benefit from outpatient PFT's ? ?#Severe protein-calorie malnutrition  ?Was not able to eat yesterday, still on Precedex. Trying to wean this down more today. Cortrak not placed yesterday, will hold tube feeds until TEE scheduled tomorrow.  ? ?#Polysubstance use ?#Alcohol use disorder ?- Continue thiamine, folic acid, MVI ?- Valproate '250mg'$  twice daily ?- Wean Precedex as able ?- Needs outpatient resources, [facility] consult once mental status improves ? ?#Chronic kidney disease IIIa ?#Chronic anemia ?Per patient's original chart (MRN: [id number]), she has CKDIIIa.  Hgb stable, no bleeding. Minor increase in creatinine, might represent euvolemia after large diuresis yesterday. Can start maintenance dose of diuretics once able to take pills.  ?- Daily BMP ?- Strict I/O ? ?#History colorectal cancer s/p abdominoperineal resection ?Ostomy in place, continues to have good output. ?-Appreciate WOC consult ? ?#Stage IV sacral decubitus ?Stable, wound unlikely to be infected per ID. Will defer imaging at this time. ?- Appreciate WOC consult ? ?Best Practice (right click and "Reselect all SmartList Selections" daily)  ? ?Diet/type: NPO , needs TF ?DVT prophylaxis: LMWH ?GI prophylaxis: N/A ?Lines: N/A ?Foley:  Yes, and it is still needed ?Code Status:  full code ?Last date of multidisciplinary goals of care discussion: wasn't able to reach family yesterday, will attempt again today ? ?Labs   ?CBC: ?Recent Labs  ?Lab 03/05/22 ?2135 03/06/22 ?0409 03/06/22 ?0427 03/06/22 ?1814 03/07/22 ?0220 03/08/22 ?0256 03/09/22 ?1020 03/11/22 ?9833  ?WBC 6.2 6.1  --   --  4.3 3.9* 4.2 3.6*  ?NEUTROABS 4.8 3.7  --   --   --   --   --  2.0  ?HGB 11.8* 11.2*   < > 10.9* 9.1* 9.4* 9.8* 10.7*  ?HCT 38.9 36.7   < > 32.0* 28.7* 31.2* 31.3* 32.5*  ?MCV 92.0 90.2  --   --  87.5 90.7 88.9 82.9  ?PLT  218 229  --   --  193 184 172 185  ? < > = values in this interval not displayed.  ? ? ? ?Basic Metabolic Panel: ?Recent Labs  ?Lab 03/06/22 ?0409 03/06/22 ?8250 03/07/22 ?0220 03/07/22 ?0932 03/08/22 ?0256 03/09/22 ?1020 03/10/22 ?0601 03/10/22 ?1848 03/11/22 ?5397  ?NA 147*   < > 143   < > 138 138 138 137 136  ?K 4.1   < > 2.3*   < > 3.4* 3.6 3.5 3.6 4.1  ?CL 114*  --  117*   < > 114* 111 109 98 101  ?CO2 19*  --  20*   < > 18* 19* 18* 28 25  ?GLUCOSE 105*  --  104*   < > 126* 122* 108* 367* 117*  ?BUN 21*  --  13   < > 6 <5* 5* <5* <5*  ?CREATININE 1.29*  --  1.30*   < > 1.31* 1.17* 1.27* 1.16* 1.37*  ?CALCIUM 9.2  --  8.5*   < > 8.4* 8.4* 8.2* 6.9* 8.1*  ?MG 2.0  1.9  --  1.9  --  1.6* 2.1 1.7  --  3.3*  ?PHOS 2.8  --   --   --  3.0  --  4.3  --  3.6  ? < > = values in this interval  not displayed.  ? ? ?GFR: ?Estimated Creatinine Clearance: 34.1 mL/min (A) (by C-G formula based on SCr of 1.37 mg/dL (H)). ?Recent Labs  ?Lab 03/05/22 ?2135 03/06/22 ?0409 03/07/22 ?0220 03/08/22 ?0256 03/09/22 ?1020 03/11/22 ?9826  ?WBC 6.2   < > 4.3 3.9* 4.2 3.6*  ?LATICACIDVEN 1.7  --   --   --   --   --   ? < > = values in this interval not displayed.  ? ? ? ?Liver Function Tests: ?Recent Labs  ?Lab 03/05/22 ?2135 03/06/22 ?0409  ?AST 24 24  ?ALT 14 14  ?ALKPHOS 77 73  ?BILITOT 0.9 0.5  ?PROT 7.1 6.9  ?ALBUMIN 3.0* 2.8*  ? ? ?No results for input(s): LIPASE, AMYLASE in the last 168 hours. ?Recent Labs  ?Lab 03/06/22 ?0409  ?AMMONIA 29  ? ? ? ?ABG ?   ?Component Value Date/Time  ? PHART 7.310 (L) 03/06/2022 1814  ? PCO2ART 42.7 03/06/2022 1814  ? PO2ART 213 (H) 03/06/2022 1814  ? HCO3 21.3 03/06/2022 1814  ? TCO2 23 03/06/2022 1814  ? ACIDBASEDEF 5.0 (H) 03/06/2022 1814  ? O2SAT 100 03/06/2022 1814  ? ?  ? ?Coagulation Profile: ?Recent Labs  ?Lab 03/06/22 ?0409  ?INR 1.0  ? ? ? ?Cardiac Enzymes: ?Recent Labs  ?Lab 03/06/22 ?0409  ?CKTOTAL 84  ? ? ? ?HbA1C: ?No results found for: HGBA1C ? ?CBG: ?Recent Labs  ?Lab 03/10/22 ?1518  03/10/22 ?1933 03/11/22 ?4158 03/11/22 ?3094 03/11/22 ?0725  ?GLUCAP 73 104* 131* 117* 116*  ? ? ? ?Review of Systems:   ?As per HPI ? ?Past Medical History:  ?She,  has a past medical history of [place]

## 2022-03-12 ENCOUNTER — Encounter (HOSPITAL_COMMUNITY)
Admission: EM | Discharge status: Against Medical Advice | Payer: Self-pay | Source: Home / Self Care | Attending: Critical Care Medicine

## 2022-03-12 DIAGNOSIS — N179 Acute kidney failure, unspecified: Secondary | ICD-10-CM

## 2022-03-12 LAB — CULTURE, BLOOD (ROUTINE X 2)
Culture: NO GROWTH
Culture: NO GROWTH
Special Requests: ADEQUATE

## 2022-03-12 LAB — BASIC METABOLIC PANEL
Anion gap: 15 (ref 5–15)
BUN: 7 mg/dL (ref 6–20)
CO2: 25 mmol/L (ref 22–32)
Calcium: 8.7 mg/dL — ABNORMAL LOW (ref 8.9–10.3)
Chloride: 99 mmol/L (ref 98–111)
Creatinine, Ser: 1.64 mg/dL — ABNORMAL HIGH (ref 0.44–1.00)
GFR, Estimated: 37 mL/min — ABNORMAL LOW (ref >60)
Glucose, Bld: 85 mg/dL (ref 70–99)
Potassium: 3.2 mmol/L — ABNORMAL LOW (ref 3.5–5.1)
Sodium: 139 mmol/L (ref 135–145)

## 2022-03-12 LAB — PHOSPHORUS
Phosphorus: 5.1 mg/dL — ABNORMAL HIGH (ref 2.5–4.6)
Phosphorus: 4.8 mg/dL — ABNORMAL HIGH (ref 2.5–4.6)

## 2022-03-12 LAB — GLUCOSE, CAPILLARY
Glucose-Capillary: 97 mg/dL (ref 70–99)
Glucose-Capillary: 76 mg/dL (ref 70–99)
Glucose-Capillary: 81 mg/dL (ref 70–99)
Glucose-Capillary: 116 mg/dL — ABNORMAL HIGH (ref 70–99)
Glucose-Capillary: 96 mg/dL (ref 70–99)
Glucose-Capillary: 112 mg/dL — ABNORMAL HIGH (ref 70–99)

## 2022-03-12 LAB — MAGNESIUM
Magnesium: 2.5 mg/dL — ABNORMAL HIGH (ref 1.7–2.4)
Magnesium: 2.2 mg/dL (ref 1.7–2.4)

## 2022-03-12 SURGERY — ECHOCARDIOGRAM, TRANSESOPHAGEAL
Anesthesia: Monitor Anesthesia Care

## 2022-03-12 MED ORDER — DIVALPROEX SODIUM 125 MG PO CSDR
500.0000 mg | DELAYED_RELEASE_CAPSULE | Freq: Two times a day (BID) | ORAL | Status: DC
  Administered 2022-03-12 – 2022-03-14 (×5): 500 mg via ORAL
  Filled 2022-03-12 (×5): qty 4

## 2022-03-12 MED ORDER — OXYCODONE-ACETAMINOPHEN 7.5-325 MG PO TABS: 1.0000 | ORAL_TABLET | Freq: Four times a day (QID) | ORAL | Status: DC | PRN

## 2022-03-12 MED ORDER — HALOPERIDOL LACTATE 5 MG/ML IJ SOLN
2.0000 mg | Freq: Four times a day (QID) | INTRAMUSCULAR | Status: DC | PRN
  Administered 2022-03-13 – 2022-03-14 (×2): 2 mg via INTRAVENOUS
  Filled 2022-03-12 (×2): qty 1

## 2022-03-12 MED ORDER — DOCUSATE SODIUM 100 MG PO CAPS
100.0000 mg | ORAL_CAPSULE | Freq: Two times a day (BID) | ORAL | Status: DC
  Administered 2022-03-12 – 2022-03-15 (×6): 100 mg via ORAL
  Filled 2022-03-12 (×6): qty 1

## 2022-03-12 MED ORDER — ENSURE ENLIVE PO LIQD
237.0000 mL | Freq: Three times a day (TID) | ORAL | Status: DC
  Administered 2022-03-12 – 2022-03-15 (×7): 237 mL via ORAL

## 2022-03-12 MED ORDER — NOREPINEPHRINE 4 MG/250ML-% IV SOLN
0.0000 ug/min | INTRAVENOUS | Status: DC
  Filled 2022-03-12: qty 250

## 2022-03-12 MED ORDER — FOLIC ACID 1 MG PO TABS
1.0000 mg | ORAL_TABLET | Freq: Every day | ORAL | Status: DC
  Administered 2022-03-13 – 2022-03-15 (×3): 1 mg via ORAL
  Filled 2022-03-12 (×3): qty 1

## 2022-03-12 MED ORDER — ADULT MULTIVITAMIN W/MINERALS CH
1.0000 | ORAL_TABLET | Freq: Every day | ORAL | Status: DC
Start: 2022-03-13 — End: 2022-03-15
  Administered 2022-03-13 – 2022-03-15 (×3): 1 via ORAL
  Filled 2022-03-12 (×3): qty 1

## 2022-03-12 MED ORDER — POTASSIUM CHLORIDE CRYS ER 20 MEQ PO TBCR
40.0000 meq | EXTENDED_RELEASE_TABLET | Freq: Two times a day (BID) | ORAL | Status: AC
  Administered 2022-03-12 (×2): 40 meq via ORAL
  Filled 2022-03-12 (×2): qty 2

## 2022-03-12 MED ORDER — NOREPINEPHRINE 4 MG/250ML-% IV SOLN
2.0000 ug/min | INTRAVENOUS | Status: DC
  Administered 2022-03-12: 2 ug/min via INTRAVENOUS

## 2022-03-12 MED ORDER — THIAMINE HCL 100 MG PO TABS
100.0000 mg | ORAL_TABLET | Freq: Every day | ORAL | Status: DC
  Administered 2022-03-13 – 2022-03-15 (×3): 100 mg via ORAL
  Filled 2022-03-12 (×3): qty 1

## 2022-03-12 MED ORDER — SODIUM CHLORIDE 0.9 % IV SOLN: 250.0000 mL | INTRAVENOUS | Status: DC

## 2022-03-12 MED ORDER — LACTATED RINGERS IV BOLUS
500.0000 mL | Freq: Once | INTRAVENOUS | Status: AC
  Administered 2022-03-12: 500 mL via INTRAVENOUS

## 2022-03-12 MED ORDER — OXYCODONE HCL 5 MG PO TABS
2.5000 mg | ORAL_TABLET | Freq: Four times a day (QID) | ORAL | Status: DC | PRN
  Administered 2022-03-12: 2.5 mg via ORAL
  Filled 2022-03-12: qty 1

## 2022-03-12 MED ORDER — OXYCODONE-ACETAMINOPHEN 5-325 MG PO TABS
1.0000 | ORAL_TABLET | Freq: Four times a day (QID) | ORAL | Status: DC | PRN
  Administered 2022-03-13 – 2022-03-15 (×4): 1 via ORAL
  Filled 2022-03-12 (×4): qty 1

## 2022-03-12 MED ORDER — UMECLIDINIUM-VILANTEROL 62.5-25 MCG/ACT IN AEPB
1.0000 | INHALATION_SPRAY | Freq: Every day | RESPIRATORY_TRACT | Status: DC
  Administered 2022-03-15: 1 via RESPIRATORY_TRACT
  Filled 2022-03-12 (×2): qty 14

## 2022-03-12 MED ORDER — THIAMINE HCL 100 MG/ML IJ SOLN: 100.0000 mg | Freq: Every day | INTRAMUSCULAR | Status: DC

## 2022-03-12 MED ORDER — AMOXICILLIN-POT CLAVULANATE 875-125 MG PO TABS
1.0000 | ORAL_TABLET | Freq: Two times a day (BID) | ORAL | Status: DC
  Administered 2022-03-12 – 2022-03-14 (×4): 1 via ORAL
  Filled 2022-03-12 (×5): qty 1

## 2022-03-12 MED ORDER — ACETAMINOPHEN 325 MG PO TABS
650.0000 mg | ORAL_TABLET | Freq: Four times a day (QID) | ORAL | Status: DC | PRN
Start: 2022-03-12 — End: 2022-03-15
  Administered 2022-03-12: 650 mg via ORAL
  Filled 2022-03-12: qty 2

## 2022-03-12 MED ORDER — ACETAMINOPHEN 650 MG RE SUPP: 650.0000 mg | Freq: Four times a day (QID) | RECTAL | Status: DC | PRN

## 2022-03-12 MED ORDER — POLYETHYLENE GLYCOL 3350 17 G PO PACK
17.0000 g | PACK | Freq: Every day | ORAL | Status: DC
  Administered 2022-03-13 – 2022-03-15 (×3): 17 g via ORAL
  Filled 2022-03-12 (×3): qty 1

## 2022-03-12 MED ORDER — ALBUMIN HUMAN 5 % IV SOLN
12.5000 g | Freq: Once | INTRAVENOUS | Status: AC
Start: 2022-03-12 — End: 2022-03-12
  Administered 2022-03-12: 12.5 g via INTRAVENOUS
  Filled 2022-03-12: qty 250

## 2022-03-12 NOTE — Progress Notes (Addendum)
Nutrition Follow-up ? ?DOCUMENTATION CODES:  ? ?Underweight, Severe malnutrition in context of social or environmental circumstances ? ?INTERVENTION:  ? ?Ensure Enlive po TID, each supplement provides 350 kcal and 20 grams of protein. ? ?MVI with minerals daily. ? ?NUTRITION DIAGNOSIS:  ? ?Severe Malnutrition related to social / environmental circumstances (polysubstance abuse) as evidenced by severe muscle depletion, severe fat depletion. ? ?Ongoing ? ?GOAL:  ? ?Patient will meet greater than or equal to 90% of their needs ? ?Progressing ? ?MONITOR:  ? ?Diet advancement, PO intake, Labs, TF tolerance, Skin ? ?REASON FOR ASSESSMENT:  ? ?Ventilator ?  ? ?ASSESSMENT:  ? ?56 yo female admitted with encephalopathy, polymicrobial bacteremia, severe sepsis, possible UTI. PMH includes polysubstance abuse, alcohol abuse, colorectal cancer, colostomy, HTN, hypothyroidism, chronic pain syndrome. ? ?Discussed patient in ICU rounds and with RN today. ?S/P TEE 5/9. Results negative for endocarditis. ?Currently on a dysphagia 3 diet with thin liquids. ?Patient states that she loves Ensure supplements. Will add TID to maximize protein and calorie intake. Encouraged good intake of meals and supplements.  ?Holding off on Cortrak placement for now. ? ?Labs reviewed. K 3.2, phos 5.1, mag 2.5 ?CBG: [id number] ? ?Medications reviewed and include colace, folic acid, MVI with minerals, Miralax, potassium chloride, thiamine. ? ?NUTRITION - FOCUSED PHYSICAL EXAM: ? ?Flowsheet Row Most Recent Value  ?Orbital Region Severe depletion  ?Upper Arm Region Severe depletion  ?Thoracic and Lumbar Region Severe depletion  ?Buccal Region Severe depletion  ?Temple Region Severe depletion  ?Clavicle Bone Region Severe depletion  ?Clavicle and Acromion Bone Region Severe depletion  ?Scapular Bone Region Severe depletion  ?Dorsal Hand Severe depletion  ?Patellar Region Severe depletion  ?Anterior Thigh Region Severe depletion  ?Posterior Calf Region Severe  depletion  ?Edema (RD Assessment) Mild  [L arm]  ?Hair Reviewed  ?Eyes Reviewed  ?Mouth Reviewed  ?Skin Reviewed  ?Nails Reviewed  ? ?  ? ? ?Diet Order:   ?Diet Order   ? ?       ?  DIET DYS 3 Room service appropriate? No; Fluid consistency: Thin  Diet effective now       ?  ? ?  ?  ? ?  ? ? ?EDUCATION NEEDS:  ? ?Not appropriate for education at this time ? ?Skin:  Skin Assessment: Skin Integrity Issues: ?Skin Integrity Issues:: Stage IV ?Stage IV: sacrum ? ?Last BM:  5/9 colostomy ? ?Height:  ? ?Ht Readings from Last 1 Encounters:  ?03/06/22 '5\' 2"'$  (1.575 m)  ? ? ?Weight:  ? ?Wt Readings from Last 1 Encounters:  ?03/12/22 43.1 kg  ? ? ?Ideal Body Weight:  50 kg ? ?BMI:  Body mass index is 17.38 kg/m?. ? ?Estimated Nutritional Needs:  ? ?Kcal:  1500-1700 ? ?Protein:  75-90 gm ? ?Fluid:  1.5-1.7 L ? ? ? ?Sherry Baker RD, LDN, CNSC ?Please refer to Amion for contact information.                                                       ? ?

## 2022-03-12 NOTE — Progress Notes (Signed)
eLink Physician-Brief Progress Note ?Patient Name: Sherry Baker ?DOB: 1966-06-05 ?MRN: [id number] ? ? ?Date of Service ? 03/12/2022  ?HPI/Events of Note ? Notified of 10/10 pain, unrelieved by acetaminophen.  ?Pt with sacral ulcer.   ?eICU Interventions ? Placed order for PRN percocet.  ?Will follow response.   ? ? ? ?  ? ?[place] ?03/12/2022, 8:31 PM ?

## 2022-03-12 NOTE — Progress Notes (Signed)
eLink Physician-Brief Progress Note ?Patient Name: Sherry Baker ?DOB: 1966-04-04 ?MRN: [id number] ? ? ?Date of Service ? 03/12/2022  ?HPI/Events of Note ? Per bedside RN patient is negative  4.0 liters in the past 24 hours, she was being diuresed, she is now profoundly hypotensive (SBP 60's to 70's), she was also on Precedex but this has been held.  ?eICU Interventions ? Albumin 5 % 250 ml iv bolus x 1, Peripheral Norepinephrine gtt, continue to hold Precedex.  ? ? ? ?  ? ?Frederik Pear ?03/12/2022, 3:40 AM ?

## 2022-03-12 NOTE — Progress Notes (Signed)
An USGPIV (ultrasound guided PIV) has been placed for short-term vasopressor infusion. A correctly placed ivWatch must be used when administering Vasopressors. Should this treatment be needed beyond 72 hours, central line access should be obtained.  It will be the responsibility of the bedside nurse to follow best practice to prevent extravasations.   ?

## 2022-03-12 NOTE — Progress Notes (Signed)
Patient transported to 5W20 without any complications. ?

## 2022-03-12 NOTE — TOC Initial Note (Signed)
Transition of Care (TOC) - Initial/Assessment Note  ? ? ?Patient Details  ?Name: Sherry Baker ?MRN: [id number] ?Date of Birth: 12/28/1965 ? ?Transition of Care [facility]) CM/SW Contact:    ?Joanne Chars, LCSW ?Phone Number: ?03/12/2022, 4:01 PM ? ?Clinical Narrative:    CSW met with pt for initial assessment.  Pt reports she has been living with her friend [name] prior to admission, however she does not plan to return to that living situation at DC.  Pt reports she often has unstable housing situation.  Pt confirms that her listed contacts are correct: Clara, aunt and Crystal, cousin.  Permission given to speak with them.  Pt is agreeable t SNF, permission given to send out referral in hub.  CSW discussed current substance use.  Pt denies drug use, says she has been clean for 6 years.  Pt acknowledges ETOH use, says she drinks 3-4x week, about 3 drinks.  Per chart, pt positive for benzos and cocaine on admission. ? ?No SSN listed in chart, so passr was not initiated. ?FL2 completed, pt sent out in hub for SNF.             ? ? ?Expected Discharge Plan: [place] ?Barriers to Discharge: Continued Medical Work up, SNF Pending bed offer ? ? ?Patient Goals and CMS Choice ?Patient states their goals for this hospitalization and ongoing recovery are:: walk on my own ?  ?  ? ?Expected Discharge Plan and Services ?Expected Discharge Plan: [place] ?In-house Referral: Clinical Social Work ?  ?Post Acute Care Choice: [place] ?Living arrangements for the past 2 months: [place] ?                ?  ?  ?  ?  ?  ?  ?  ?  ?  ?  ? ?Prior Living Arrangements/Services ?Living arrangements for the past 2 months: [place] ?Lives with:: Significant Other ?Patient language and need for interpreter reviewed:: Yes ?Do you feel safe going back to the place where you live?: Yes      ?Need for Family Participation in Patient Care: No (Comment) ?Care giver support system in  place?: Yes (comment) ?Current home services: Other (comment) (none) ?Criminal Activity/Legal Involvement Pertinent to Current Situation/Hospitalization: No - Comment as needed ? ?Activities of Daily Living ?  ?  ? ?Permission Sought/Granted ?Permission sought to share information with : Family Supports ?Permission granted to share information with : Yes, Verbal Permission Granted ? Share Information with NAME: Clara-aunt, Crystal-cousin ? Permission granted to share info w AGENCY: SNF ?   ?   ? ?Emotional Assessment ?Appearance:: Appears stated age ?Attitude/Demeanor/Rapport: Engaged ?Affect (typically observed): Appropriate, Pleasant ?Orientation: : Oriented to Self, Oriented to  Time ?Alcohol / Substance Use: Illicit Drugs, Alcohol Use ?Psych Involvement: No (comment) ? ?Admission diagnosis:  Acute encephalopathy [G93.40] ?Altered mental status, unspecified altered mental status type [R41.82] ?AMS (altered mental status) [R41.82] ?Patient Active Problem List  ? Diagnosis Date Noted  ? Pressure injury of skin 03/11/2022  ? Protein-calorie malnutrition, severe 03/10/2022  ? UTI (urinary tract infection) 03/06/2022  ? Hypoglycemia 03/06/2022  ? Hypernatremia 03/06/2022  ? Cocaine abuse ([place]) 03/06/2022  ? High anion gap metabolic acidosis 54/65/6812  ? Elevated serum creatinine 03/06/2022  ? Hypercalcemia 03/06/2022  ? AMS (altered mental status) 03/06/2022  ? Acquired hypothyroidism   ? HTN (hypertension)   ? Endotracheally intubated   ? Acute encephalopathy 03/05/2022  ? ?PCP:  Pcp,  No ?Pharmacy:  No Pharmacies Listed ? ? ? ?Social Determinants of Health (SDOH) Interventions ?  ? ?Readmission Risk Interventions ?   ? View : No data to display.  ?  ?  ?  ? ? ? ?

## 2022-03-12 NOTE — Progress Notes (Signed)
Physical Therapy Treatment ?Patient Details ?Name: Sherry Baker ?MRN: [id number] ?DOB: 1966-06-19 ?Today's Date: 03/12/2022 ? ? 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 Intubated 5/4-5/6. PMH - colostomy, chronic pain, htn, chronic opiod therapy. polysubstance abuse ? ?  ?PT Comments  ? ? Pt making good progress with mobility. Continue to recommend SNF at this time due to requiring assist with all mobility and poor support at home.    ?Recommendations for follow up therapy are one component of a multi-disciplinary discharge planning process, led by the attending physician.  Recommendations may be updated based on patient status, additional functional criteria and insurance authorization. ? ?Follow Up Recommendations ? Skilled nursing-short term rehab (<3 hours/day) ?  ?  ?Assistance Recommended at Discharge Frequent or constant Supervision/Assistance  ?Patient can return home with the following Direct supervision/assist for financial management;Direct supervision/assist for medications management;Assistance with cooking/housework;Help with stairs or ramp for entrance;Assist for transportation;A little help with walking and/or transfers;A little help with bathing/dressing/bathroom ?  ?Equipment Recommendations ? None recommended by PT  ?  ?Recommendations for Other Services   ? ? ?  ?Precautions / Restrictions Precautions ?Precautions: Fall  ?  ? ?Mobility ? Bed Mobility ?Overal bed mobility: Needs Assistance ?Bed Mobility: Sit to Supine, Supine to Sit ?  ?  ?Supine to sit: Min assist ?Sit to supine: Min guard ?  ?General bed mobility comments: Assist to elevate trunk into sitting. Assist for safety and lines on return to supine ?  ? ?Transfers ?Overall transfer level: Needs assistance ?Equipment used: Rolling walker (2 wheels) ?Transfers: Sit to/from Stand ?  ?  ?  ?  ?  ?  ?General transfer comment: Assist to bring hips up and for balance. ?   ? ?Ambulation/Gait ?Ambulation/Gait assistance: Min assist, +2 safety/equipment ?Gait Distance (Feet): 60 Feet ?Assistive device: Rolling walker (2 wheels) ?Gait Pattern/deviations: Step-through pattern, Decreased step length - right, Decreased step length - left, Shuffle, Narrow base of support ?Gait velocity: decr ?Gait velocity interpretation: <1.31 ft/sec, indicative of household ambulator ?  ?General Gait Details: Assist for balance and support. Verbal cues to stay closer to walker ? ? ?Stairs ?  ?  ?  ?  ?  ? ? ?Wheelchair Mobility ?  ? ?Modified Rankin (Stroke Patients Only) ?  ? ? ?  ?Balance Overall balance assessment: Needs assistance ?Sitting-balance support: Bilateral upper extremity supported, Feet supported ?Sitting balance-Leahy Scale: Poor ?Sitting balance - Comments: Sat EOB with UE support and supervision ?  ?Standing balance support: Bilateral upper extremity supported, Reliant on assistive device for balance ?Standing balance-Leahy Scale: Poor ?Standing balance comment: walker and min assist for static standing ?  ?  ?  ?  ?  ?  ?  ?  ?  ?  ?  ?  ? ?  ?Cognition Arousal/Alertness: Awake/alert ?Behavior During Therapy: [facility] for tasks assessed/performed ?Overall Cognitive Status: Impaired/Different from baseline ?Area of Impairment: Orientation, Attention, Memory, Following commands, Safety/judgement, Awareness, Problem solving ?  ?  ?  ?  ?  ?  ?  ?  ?Orientation Level: Disoriented to, Place, Time, Situation ?Current Attention Level: Sustained ?Memory: Decreased short-term memory, Decreased recall of precautions ?Following Commands: Follows one step commands consistently ?Safety/Judgement: Decreased awareness of safety, Decreased awareness of deficits ?Awareness: Intellectual ?Problem Solving: Requires verbal cues ?  ?  ?  ? ?  ?Exercises   ? ?  ?General Comments General comments (skin integrity, edema,  etc.): VSS on RA ?  ?  ? ?Pertinent Vitals/Pain Pain Assessment ?Faces Pain Scale: Hurts little  more ?Pain Location: lt foot ?Pain Descriptors / Indicators: Grimacing  ? ? ?Home Living   ?  ?  ?  ?  ?  ?  ?  ?  ?  ?   ?  ?Prior Function    ?  ?  ?   ? ?PT Goals (current goals can now be found in the care plan section) Acute Rehab PT Goals ?PT Goal Formulation: With patient ?Time For Goal Achievement: 03/24/22 ?Potential to Achieve Goals: Good ?Progress towards PT goals: Goals met and updated - see care plan ? ?  ?Frequency ? ? ? Min 3X/week ? ? ? ?  ?PT Plan Current plan remains appropriate  ? ? ?Co-evaluation   ?  ?  ?  ?  ? ?  ?AM-PAC PT "6 Clicks" Mobility   ?Outcome Measure ? Help needed turning from your back to your side while in a flat bed without using bedrails?: A Little ?Help needed moving from lying on your back to sitting on the side of a flat bed without using bedrails?: A Little ?Help needed moving to and from a bed to a chair (including a wheelchair)?: A Little ?Help needed standing up from a chair using your arms (e.g., wheelchair or bedside chair)?: A Little ?Help needed to walk in hospital room?: A Little ?Help needed climbing 3-5 steps with a railing? : Total ?6 Click Score: 16 ? ?  ?End of Session   ?Activity Tolerance: Patient tolerated treatment well ?Patient left: in bed;with call bell/phone within reach;with bed alarm set ?Nurse Communication: Mobility status (Nurse present and assisting with mobility) ?PT Visit Diagnosis: Other abnormalities of gait and mobility (R26.89);Muscle weakness (generalized) (M62.81);History of falling (Z91.81) ?  ? ? ?Time: 8883-5844 ?PT Time Calculation (min) (ACUTE ONLY): 32 min ? ?Charges:  $Gait Training: 23-37 mins          ?          ? ?[facility] PT ?Acute Rehabilitation Services ?Office [phone] ? ? ? ?Shary Decamp [facility] ?03/12/2022, 12:56 PM ? ?

## 2022-03-12 NOTE — [place] FL2 (Signed)
?[place] MEDICAID FL2 LEVEL OF CARE SCREENING TOOL  ?  ? ?IDENTIFICATION  ?Patient Name: ?Sherry Baker Birthdate: August 19, 1966 Sex: female Admission Date (Current Location): ?03/05/2022  ?[place] and [place] Number: ? [place] ?[id number] O Facility and Address:  ?[facility], [place] [address], [place], [place] [address] ?     Provider Number: ?[id number]  ?Attending Physician Name and Address:  ?Julian Hy, DO ? Relative Name and Phone Number:  ?Sharolyn Douglas   [phone] ?   ?Current Level of Care: ?Hospital Recommended Level of Care: ?[place] Prior Approval Number: ?  ? ?Date Approved/Denied: ?  PASRR Number: ?  ? ?Discharge Plan: ?SNF ?  ? ?Current Diagnoses: ?Patient Active Problem List  ? Diagnosis Date Noted  ? Pressure injury of skin 03/11/2022  ? Protein-calorie malnutrition, severe 03/10/2022  ? UTI (urinary tract infection) 03/06/2022  ? Hypoglycemia 03/06/2022  ? Hypernatremia 03/06/2022  ? Cocaine abuse ([place]) 03/06/2022  ? High anion gap metabolic acidosis 81/08/3158  ? Elevated serum creatinine 03/06/2022  ? Hypercalcemia 03/06/2022  ? AMS (altered mental status) 03/06/2022  ? Acquired hypothyroidism   ? HTN (hypertension)   ? Endotracheally intubated   ? Acute encephalopathy 03/05/2022  ? ? ?Orientation RESPIRATION BLADDER Height & Weight   ?  ?Self, Time ? Normal Incontinent, Indwelling catheter Weight: 95 lb 0.3 oz (43.1 kg) ?Height:  _0  (157.5 cm)  ?BEHAVIORAL SYMPTOMS/MOOD NEUROLOGICAL BOWEL NUTRITION STATUS  ?    Continent Diet (see discharge summary)  ?AMBULATORY STATUS COMMUNICATION OF NEEDS Skin   ?Limited Assist Verbally Skin abrasions ?  ?  ?  ?    ?     ?     ? ? ?Personal Care Assistance Level of Assistance  ?Bathing, Feeding, Dressing Bathing Assistance: Maximum assistance ?Feeding assistance: Limited assistance ?Dressing Assistance: Maximum assistance ?   ? ?Functional Limitations Info  ?Sight, Hearing, Speech Sight Info: Adequate ?Hearing  Info: Adequate ?Speech Info: Adequate  ? ? ?SPECIAL CARE FACTORS FREQUENCY  ?PT (By licensed PT), OT (By licensed OT)   ?  ?PT Frequency: 5x week ?OT Frequency: 5x week ?  ?  ?  ?   ? ? ?Contractures Contractures Info: Not present  ? ? ?Additional Factors Info  ?Code Status, Allergies Code Status Info: full ?Allergies Info: NKA ?  ?  ?  ?   ? ?Current Medications (03/12/2022):  This is the current hospital active medication list ?Current Facility-Administered Medications  ?Medication Dose Route Frequency Provider Last Rate Last Admin  ? 0.9 %  sodium chloride infusion  250 mL Intravenous Continuous Frederik Pear, MD   Held at 03/12/22 4585  ? acetaminophen (TYLENOL) tablet 650 mg  650 mg Oral Q6H PRN Julian Hy, DO      ? Or  ? acetaminophen (TYLENOL) suppository 650 mg  650 mg Rectal Q6H PRN Noemi Chapel P, DO      ? albuterol (PROVENTIL) (2.5 MG/3ML) 0.083% nebulizer solution 2.5 mg  2.5 mg Nebulization Q2H PRN Sanjuan Dame, MD      ? amoxicillin-clavulanate (AUGMENTIN) 875-125 MG per tablet 1 tablet  1 tablet Oral Q12H Julian Hy, DO      ? artificial tears (LACRILUBE) ophthalmic ointment   Both Eyes BID Collene Gobble, MD   Given at 03/12/22 [phone]  ? chlorhexidine gluconate (MEDLINE KIT) (PERIDEX) 0.12 % solution 15 mL  15 mL Mouth Rinse BID Hunsucker, Bonna Gains, MD   15 mL at 03/12/22  1478  ? Chlorhexidine Gluconate Cloth 2 % PADS 6 each  6 each Topical Daily Julian Hy, DO   6 each at 03/11/22 2105  ? cloNIDine (CATAPRES - Dosed in mg/24 hr) patch 0.1 mg  0.1 mg Transdermal Weekly Lavina Hamman, MD   0.1 mg at 03/06/22 1023  ? dextrose 50 % solution 50 mL  1 ampule Intravenous Q1H PRN Howerter, Justin B, DO   50 mL at 03/06/22 0119  ? divalproex (DEPAKOTE SPRINKLE) capsule 500 mg  500 mg Oral Q12H Noemi Chapel P, DO      ? docusate sodium (COLACE) capsule 100 mg  100 mg Oral BID Noemi Chapel P, DO      ? enoxaparin (LOVENOX) injection 30 mg  30 mg Subcutaneous Q24H Sloan Leiter B, RPH    30 mg at 03/12/22 2956  ? feeding supplement (ENSURE ENLIVE / ENSURE PLUS) liquid 237 mL  237 mL Oral TID BM Noemi Chapel P, DO   237 mL at 03/12/22 1230  ? [START ON 12/17/863] folic acid (FOLVITE) tablet 1 mg  1 mg Oral Daily Noemi Chapel P, DO      ? haloperidol lactate (HALDOL) injection 2 mg  2 mg Intravenous Q6H PRN Noemi Chapel P, DO      ? ipratropium-albuterol (DUONEB) 0.5-2.5 (3) MG/3ML nebulizer solution 3 mL  3 mL Nebulization Q4H PRN Noemi Chapel P, DO      ? leptospermum manuka honey (MEDIHONEY) paste 1 application.  1 application. Topical Daily Byrum, Rose Fillers, MD   1 application. at 03/11/22 1025  ? LORazepam (ATIVAN) injection 1 mg  1 mg Intravenous Q2H PRN Anders Simmonds, MD   1 mg at 03/12/22 7846  ? [START ON 03/13/2022] multivitamin with minerals tablet 1 tablet  1 tablet Oral Daily Noemi Chapel P, DO      ? [START ON 03/13/2022] polyethylene glycol (MIRALAX / GLYCOLAX) packet 17 g  17 g Oral Daily Noemi Chapel P, DO      ? potassium chloride SA (KLOR-CON M) CR tablet 40 mEq  40 mEq Oral BID Sanjuan Dame, MD   40 mEq at 03/12/22 1118  ? sodium chloride flush (NS) 0.9 % injection 10-40 mL  10-40 mL Intracatheter Q12H Hunsucker, Bonna Gains, MD   10 mL at 03/12/22 9629  ? sodium chloride flush (NS) 0.9 % injection 10-40 mL  10-40 mL Intracatheter PRN Hunsucker, Bonna Gains, MD      ? Derrill Memo ON 03/13/2022] thiamine tablet 100 mg  100 mg Oral Daily Julian Hy, DO      ? Or  ? [START ON 03/13/2022] thiamine (B-1) injection 100 mg  100 mg Intravenous Daily Noemi Chapel P, DO      ? [START ON 03/13/2022] umeclidinium-vilanterol (ANORO ELLIPTA) 62.5-25 MCG/ACT 1 puff  1 puff Inhalation Daily Julian Hy, DO      ? ? ? ?Discharge Medications: ?Please see discharge summary for a list of discharge medications. ? ?Relevant Imaging Results: ? ?Relevant Lab Results: ? ? ?Additional Information ?SSN: not available ? ?Joanne Chars, LCSW ? ? ? ? ?

## 2022-03-12 NOTE — Progress Notes (Signed)
? ?NAME:  Sherry Baker, MRN:  [id number], DOB:  1965/12/20, LOS: 7 ?ADMISSION DATE:  03/05/2022, CONSULTATION DATE:  03/06/2022 ?REFERRING MD:  Dr. Posey Pronto, CHIEF COMPLAINT:  Agitation   ? ?History of Present Illness:  ?Sherry Baker is a 56 y.o. female with a PMH significant for hypothyroidism, chronic pain syndrome, and HTN who presented to the emergency department overnight 5/3 due to altered mental status.  Per chart review it appears patient has had multiple EMS calls to the resident prior to 5/3 for falls/altered mental status but often was alert enough to refuse transfer to hospital.  However evening of 5/3 patient had additional fall with worsening altered mental status that resulted in recall to EMS and eventual transfer to hospital.  Patient required 5 mg IV Versed with EMS secondary to agitation. ? ?Per chart review and history obtained from family patient has long history with struggle with substance abuse including crack cocaine, PCP, and heavy alcohol use.  Patient typically presents to ED towards the end of the month when finds have expired and this leads to withdrawal symptoms.  Family states it is unlikely that she is currently withdrawing only as she recently received her welfare check May 1. ? ?On ED evaluation this a.m. patient is seen mildly febrile with temperature 100.3, tachypneic with respiratory rate 25-30, tachycardia with heart rate 1 30-1 50, and mild hypertension.  Currently able to protect airway and on room air, however she has snoring respirations on assessment.  ? ?PCCM consulted midmorning of 5/4 for worsening agitation despite aggressive pharmacologic therapy   ? ?Pertinent  Medical History  ?Hypothyroidism ?Chronic pain syndrome ?Polysubstance abuse  ?Hypertension ?Colorectal cancer s/p abdominoperineal resection ? ?Significant Hospital Events: ?Including procedures, antibiotic start and stop dates in addition to other pertinent events   ?5/4 admitted for AMS and multiple falls  ?Extubated  on Precedex 5/6 ? ?Interim History / Subjective:  ?This morning off of Precedex. Reports she needs to call her mother. Otherwise mentions feeling hungry. ? ?Objective   ?Blood pressure 101/65, pulse 68, temperature 97.7 ?F (36.5 ?C), resp. rate 18, height '5\' 2"'$  (1.575 m), weight 43.1 kg, SpO2 97 %. ?   ?FiO2 (%):  [21 %] 21 %  ? ?Intake/Output Summary (Last 24 hours) at 03/12/2022 0713 ?Last data filed at 03/12/2022 0500 ?Gross per 24 hour  ?Intake 992.81 ml  ?Output 5705 ml  ?Net -4712.19 ml  ? ? ?Filed Weights  ? 03/11/22 0400 03/11/22 2000 03/12/22 0500  ?Weight: 46 kg 43.1 kg 43.1 kg  ? ?Examination: ?General: Deconditioned, resting in bed in no acute distress ?HEENT: Normocephalic, atraumatic. Dry mucous membranes. ?Neuro: Awake, alert, conversing appropriately. Grossly non-focal. ?CV: Regular rate, rhythm.  No murmurs. ?PULM:  Normal respiratory effort on room air. No wheezing appreciated. ?GI: Soft, non-tender, non-distended. Normoactive bowel sounds. Ostomy in place with appropriate stool output.  ?Extremities: Warm, dry. No peripheral edema. ?Skin: No rashes or lesions ? ?Assessment & Plan:  ?#Severe sepsis 2/2 Enterococcos faecalis, Streptococcus bacteremia ?#Acute respiratory failure  ?Patient hemodynamically stable, not requiring pressors or Precedex now. Leukocytosis has resolved. R/p BCx still NGTD.  TEE yesterday without evidence of endocarditis. Per ID will plan on 14 days of antibiotics. At this time she is appropriate for the floors.  ?- Ampicillin 2g q6h (day 5) ?- Transfer out of ICU ? ?#Acute metabolic encephalopathy 2/2 infection ?Pt was able to be weaned off Precedex overnight. This morning she is awake, alert, conversing appropriately. She did pass bedside swallow this  morning.  ?- Diet ordered ?- Valproate '500mg'$  twice daily ? ?#COPD ?No previous PFT's on either of patient's charts. Patient was previously on triple bronchodilator therapy. Lungs clear this morning, no signs of acute  worsening. ?- Brovana, Pulmicort, Yupleri nebulizers ?- DuoNebs q4h, albuterol PRN ?- Would benefit from outpatient PFT's ? ?#Severe protein-calorie malnutrition  ?This morning passed bedside swallow, will have her begin taking po. ?- Diet ordered ?- Thiamine, folic acid, MVI ? ?#Polysubstance use ?#Alcohol use disorder ?- Continue thiamine, folic acid, MVI ?- Valproate '500mg'$  twice daily ?- Needs outpatient resources, [facility] consult once mental status improves ? ?#Chronic kidney disease IIIa ?#Chronic anemia ?Per patient's original chart (MRN: [id number]), she has CKDIIIa.  Hgb stable, no bleeding. She has had 13L UOP over last 24h, net negative 5L. Slight increase in creatinine over the last two days, might have over-diuresed.  ?- Daily BMP ?- Strict I/O ? ?#History colorectal cancer s/p abdominoperineal resection ?Ostomy in place, continues to have good output. ?-Appreciate WOC consult ? ?#Stage IV sacral decubitus ?Stable, wound unlikely to be infected per ID. Will defer imaging at this time. ?- Appreciate WOC consult ? ?#Hypokalemia ?Repleting today. Mg okay. ? ?Best Practice (right click and "Reselect all SmartList Selections" daily)  ? ?Diet/type: HH ?DVT prophylaxis: LMWH ?GI prophylaxis: N/A ?Lines: N/A ?Foley:  Yes, and it is still needed ?Code Status:  full code ?Last date of multidisciplinary goals of care discussion: wasn't able to reach family yesterday, will attempt again today ? ?Labs   ?CBC: ?Recent Labs  ?Lab 03/05/22 ?2135 03/06/22 ?0409 03/06/22 ?0427 03/06/22 ?1814 03/07/22 ?0220 03/08/22 ?0256 03/09/22 ?1020 03/11/22 ?2778  ?WBC 6.2 6.1  --   --  4.3 3.9* 4.2 3.6*  ?NEUTROABS 4.8 3.7  --   --   --   --   --  2.0  ?HGB 11.8* 11.2*   < > 10.9* 9.1* 9.4* 9.8* 10.7*  ?HCT 38.9 36.7   < > 32.0* 28.7* 31.2* 31.3* 32.5*  ?MCV 92.0 90.2  --   --  87.5 90.7 88.9 82.9  ?PLT 218 229  --   --  193 184 172 185  ? < > = values in this interval not displayed.  ? ? ?Basic Metabolic Panel: ?Recent Labs  ?Lab  03/08/22 ?0256 03/09/22 ?1020 03/10/22 ?0601 03/10/22 ?1848 03/11/22 ?2423 03/11/22 ?1632 03/12/22 ?5361  ?NA 138 138 138 137 136  --   --   ?K 3.4* 3.6 3.5 3.6 4.1  --   --   ?CL 114* 111 109 98 101  --   --   ?CO2 18* 19* 18* 28 25  --   --   ?GLUCOSE 126* 122* 108* 367* 117*  --   --   ?BUN 6 <5* 5* <5* <5*  --   --   ?CREATININE 1.31* 1.17* 1.27* 1.16* 1.37*  --   --   ?CALCIUM 8.4* 8.4* 8.2* 6.9* 8.1*  --   --   ?MG 1.6* 2.1 1.7  --  3.3* 2.9* 2.5*  ?PHOS 3.0  --  4.3  --  3.6 4.6 5.1*  ? ? ?GFR: ?Estimated Creatinine Clearance: 31.9 mL/min (A) (by C-G formula based on SCr of 1.37 mg/dL (H)). ?Recent Labs  ?Lab 03/05/22 ?2135 03/06/22 ?0409 03/07/22 ?0220 03/08/22 ?0256 03/09/22 ?1020 03/11/22 ?4431  ?WBC 6.2   < > 4.3 3.9* 4.2 3.6*  ?LATICACIDVEN 1.7  --   --   --   --   --   ? < > =  values in this interval not displayed.  ? ? ?Liver Function Tests: ?Recent Labs  ?Lab 03/05/22 ?2135 03/06/22 ?0409  ?AST 24 24  ?ALT 14 14  ?ALKPHOS 77 73  ?BILITOT 0.9 0.5  ?PROT 7.1 6.9  ?ALBUMIN 3.0* 2.8*  ? ? ?No results for input(s): LIPASE, AMYLASE in the last 168 hours. ?Recent Labs  ?Lab 03/06/22 ?0409  ?AMMONIA 29  ? ? ?ABG ?   ?Component Value Date/Time  ? PHART 7.310 (L) 03/06/2022 1814  ? PCO2ART 42.7 03/06/2022 1814  ? PO2ART 213 (H) 03/06/2022 1814  ? HCO3 21.3 03/06/2022 1814  ? TCO2 23 03/06/2022 1814  ? ACIDBASEDEF 5.0 (H) 03/06/2022 1814  ? O2SAT 100 03/06/2022 1814  ? ? ?Coagulation Profile: ?Recent Labs  ?Lab 03/06/22 ?0409  ?INR 1.0  ? ? ?Cardiac Enzymes: ?Recent Labs  ?Lab 03/06/22 ?0409  ?CKTOTAL 84  ? ? ?HbA1C: ?No results found for: HGBA1C ? ?CBG: ?Recent Labs  ?Lab 03/11/22 ?1134 03/11/22 ?1503 03/11/22 ?1916 03/11/22 ?2322 03/12/22 ?0329  ?GLUCAP 105* 110* 82 97 76  ? ? ?Review of Systems:   ?As per HPI ? ?Past Medical History:  ?She,  has a past medical history of Acquired hypothyroidism, Chronic pain syndrome, and HTN (hypertension).  ? ?Critical care time:   ?CRITICAL CARE ? ?Independent CC time 25  minutes ? ?Sanjuan Dame, MD ?Internal Medicine PGY-2 ?Pager: [phone] ? ? ?

## 2022-03-12 NOTE — Evaluation (Signed)
?Clinical/Bedside Swallow Evaluation ?Patient Details  ?Name: Sherry Baker ?MRN: [id number] ?Date of Birth: [birth year] ? ?Today's Date: 03/12/2022 ?Time: SLP Start Time (ACUTE ONLY): 1000 SLP Stop Time (ACUTE ONLY): 5732 ?SLP Time Calculation (min) (ACUTE ONLY): 15 min ? ?Past Medical History:  ?Past Medical History:  ?Diagnosis Date  ? Acquired hypothyroidism   ? Chronic pain syndrome   ? HTN (hypertension)   ? ?Past Surgical History:  ?Past Surgical History:  ?Procedure Laterality Date  ? Colostomy    ? ?HPI:  ?Patient is a 56 y.o. female with PMH: chronic pain syndrome on chronic opioid therapy, acquired hypothyroidism, hypertension, colostomy, who is admitted to [facility] on 03/05/2022 with acute encephalopathy after presenting from home to [facility] ED for evaluation of altered mental status. She became combative on way to ED via EMS and required IV Versed via EMS. In ED she was afebrile, HR: 125 (decreased to 106 with IV fluids), UDS positive for cocaine and benzodiazepines, negative for opiods. CXR showed no evidence of cardiopulmonary process, CT head showed no evidence for acute intracranial process. Patient was admitted with acute encephalopathy with presentation also notable for UTI.  ?  ?Assessment / Plan / Recommendation  ?Clinical Impression ? Patient presents with what appears to be a primary oral dysphagia but with pharyngeal phase of swallow appearing WFL. Her voice was mildly hoarse but clear otherwise. She is missing numerous teeth and quality of those she has is poor. She reported that she eats softer foods and describes chopping up lettuce finer when eating salads. No overt s/s aspiration or penetration observed with thin liquids (soda) or with solids (graham cracker). Mastication was mildly delayed and suspect largely due to patient's poor dentition. SLP plans to f/u at least one time to ensure diet toleration prior to s/o. ?SLP Visit Diagnosis: Dysphagia, unspecified (R13.10) ?   ?Aspiration  Risk ? Mild aspiration risk  ?  ?Diet Recommendation Dysphagia 3 (Mech soft);Thin liquid  ? ?Liquid Administration via: Straw;Cup ?Medication Administration: Whole meds with liquid ?Supervision: Patient able to self feed ?Compensations: Minimize environmental distractions;Slow rate;Small sips/bites  ?  ?Other  Recommendations Oral Care Recommendations: Oral care BID   ? ?Recommendations for follow up therapy are one component of a multi-disciplinary discharge planning process, led by the attending physician.  Recommendations may be updated based on patient status, additional functional criteria and insurance authorization. ? ?Follow up Recommendations No SLP follow up  ? ? ?  ?Assistance Recommended at Discharge None  ?Functional Status Assessment Patient has had a recent decline in their functional status and demonstrates the ability to make significant improvements in function in a reasonable and predictable amount of time.  ?Frequency and Duration min 1 x/week  ?1 week ?  ?   ? ?Prognosis Prognosis for Safe Diet Advancement: Good  ? ?  ? ?Swallow Study   ?General Date of Onset: 03/12/22 ?HPI: Patient is a 56 y.o. female with PMH: chronic pain syndrome on chronic opioid therapy, acquired hypothyroidism, hypertension, colostomy, who is admitted to [facility] on 03/05/2022 with acute encephalopathy after presenting from home to [facility] ED for evaluation of altered mental status. She became combative on way to ED via EMS and required IV Versed via EMS. In ED she was afebrile, HR: 125 (decreased to 106 with IV fluids), UDS positive for cocaine and benzodiazepines, negative for opiods. CXR showed no evidence of cardiopulmonary process, CT head showed no evidence for acute intracranial process. Patient was admitted with acute encephalopathy with  presentation also notable for UTI. ?Type of Study: Bedside Swallow Evaluation ?Previous Swallow Assessment: none found ?Diet Prior to this Study: NPO ?Temperature Spikes  Noted: No ?Respiratory Status: Room air ?History of Recent Intubation: Yes ?Length of Intubations (days): 3 days ?Date extubated: 03/08/22 ?Behavior/Cognition: Alert;Cooperative;Pleasant mood ?Oral Cavity Assessment: Within Functional Limits ?Oral Care Completed by SLP: Yes ?Oral Cavity - Dentition: Poor condition;Missing dentition ?Vision: Functional for self-feeding ?Self-Feeding Abilities: Able to feed self ?Patient Positioning: Upright in bed ?Baseline Vocal Quality: Hoarse ?Volitional Cough: Strong ?Volitional Swallow: Able to elicit  ?  ?Oral/Motor/Sensory Function Overall Oral Motor/Sensory Function: Within functional limits   ?Ice Chips     ?Thin Liquid Thin Liquid: Within functional limits ?Presentation: Straw;Self Fed  ?  ?Nectar Thick     ?Honey Thick     ?Puree Puree: Not tested   ?Solid ? ? ?  Solid: Impaired ?Oral Phase Impairments: Impaired mastication  ? ?  ? ?Sonia Baller, MA, CCC-SLP ?Speech Therapy ? ? ? ?

## 2022-03-13 ENCOUNTER — Encounter (HOSPITAL_COMMUNITY): Payer: Self-pay | Admitting: Pulmonary Disease

## 2022-03-13 ENCOUNTER — Inpatient Hospital Stay: Payer: Medicaid Other | Admitting: Internal Medicine

## 2022-03-13 ENCOUNTER — Other Ambulatory Visit: Payer: Self-pay

## 2022-03-13 DIAGNOSIS — L8994 Pressure ulcer of unspecified site, stage 4: Secondary | ICD-10-CM

## 2022-03-13 LAB — COMPREHENSIVE METABOLIC PANEL
ALT: 11 U/L (ref 0–44)
AST: 20 U/L (ref 15–41)
Albumin: 2.7 g/dL — ABNORMAL LOW (ref 3.5–5.0)
Alkaline Phosphatase: 64 U/L (ref 38–126)
Anion gap: 11 (ref 5–15)
BUN: 17 mg/dL (ref 6–20)
CO2: 21 mmol/L — ABNORMAL LOW (ref 22–32)
Calcium: 8.7 mg/dL — ABNORMAL LOW (ref 8.9–10.3)
Chloride: 104 mmol/L (ref 98–111)
Creatinine, Ser: 2.07 mg/dL — ABNORMAL HIGH (ref 0.44–1.00)
GFR, Estimated: 28 mL/min — ABNORMAL LOW (ref >60)
Glucose, Bld: 90 mg/dL (ref 70–99)
Potassium: 5.1 mmol/L (ref 3.5–5.1)
Sodium: 136 mmol/L (ref 135–145)
Total Bilirubin: 0.7 mg/dL (ref 0.3–1.2)
Total Protein: 7.1 g/dL (ref 6.5–8.1)

## 2022-03-13 LAB — GLUCOSE, CAPILLARY
Glucose-Capillary: 128 mg/dL — ABNORMAL HIGH (ref 70–99)
Glucose-Capillary: 92 mg/dL (ref 70–99)
Glucose-Capillary: 93 mg/dL (ref 70–99)
Glucose-Capillary: 96 mg/dL (ref 70–99)
Glucose-Capillary: 98 mg/dL (ref 70–99)

## 2022-03-13 LAB — MAGNESIUM: Magnesium: 2.2 mg/dL (ref 1.7–2.4)

## 2022-03-13 LAB — CBC
HCT: 35.2 % — ABNORMAL LOW (ref 36.0–46.0)
Hemoglobin: 11.1 g/dL — ABNORMAL LOW (ref 12.0–15.0)
MCH: 26.7 pg (ref 26.0–34.0)
MCHC: 31.5 g/dL (ref 30.0–36.0)
MCV: 84.8 fL (ref 80.0–100.0)
Platelets: 283 10*3/uL (ref 150–400)
RBC: 4.15 MIL/uL (ref 3.87–5.11)
RDW: 18.4 % — ABNORMAL HIGH (ref 11.5–15.5)
WBC: 5.1 10*3/uL (ref 4.0–10.5)
nRBC: 0 % (ref 0.0–0.2)

## 2022-03-13 MED ORDER — BUSPIRONE HCL 5 MG PO TABS
5.0000 mg | ORAL_TABLET | Freq: Three times a day (TID) | ORAL | Status: DC
  Administered 2022-03-13 – 2022-03-15 (×6): 5 mg via ORAL
  Filled 2022-03-13 (×6): qty 1

## 2022-03-13 NOTE — Progress Notes (Signed)
Occupational Therapy Treatment ?Patient Details ?Name: Sherry Baker ?MRN: [id number] ?DOB: January 30, 1966 ?Today's Date: 03/13/2022 ? ? ?History of present illness 56 yo female presenting to ED on 5/3 with AMS and found to have sepsis and likely endocarditis due to IVDU. Intubated 5/4-5/6. PMH including colostomy, chronic pain, HTN, chronic opiod therapy. polysubstance abuse ?  ?OT comments ? Pt progressing towards established OT goals and demonstrating increased activity tolerance. Pt performing grooming at sink with Min Guard A; requiring cues for initiating tasks. Pt requiring Min A for functional mobility with RW; demonstrating poor safety and requiring Max cues for RW management. Pt continues to present with decreased activity tolerance with HR elevating to 150s and poor cognition with limited attention, awareness, and problem solving. Continue to recommend dc to SNF and will continue to follow acutely as admitted.   ? ?Recommendations for follow up therapy are one component of a multi-disciplinary discharge planning process, led by the attending physician.  Recommendations may be updated based on patient status, additional functional criteria and insurance authorization. ?   ?Follow Up Recommendations ? Skilled nursing-short term rehab (<3 hours/day)  ?  ?Assistance Recommended at Discharge Frequent or constant Supervision/Assistance  ?Patient can return home with the following ? A little help with walking and/or transfers;A little help with bathing/dressing/bathroom;Assist for transportation;Direct supervision/assist for medications management;Direct supervision/assist for financial management ?  ?Equipment Recommendations ? Other (comment) (Defer to next venue)  ?  ?Recommendations for Other Services   ? ?  ?Precautions / Restrictions Precautions ?Precautions: Fall;Other (comment) ?Precaution Comments: watch HR  ? ? ?  ? ?Mobility Bed Mobility ?Overal bed mobility: Needs Assistance ?Bed Mobility: Supine to Sit, Sit  to Supine ?  ?  ?Supine to sit: Min guard ?Sit to supine: Min guard ?  ?General bed mobility comments: Min guard A for safety ?  ? ?Transfers ?Overall transfer level: Needs assistance ?Equipment used: Rolling walker (2 wheels) ?Transfers: Sit to/from Stand ?Sit to Stand: Min guard ?  ?  ?  ?  ?  ?General transfer comment: Close Min Guard A for safety ?  ?  ?Balance Overall balance assessment: Needs assistance ?Sitting-balance support: No upper extremity supported, Feet supported ?Sitting balance-Leahy Scale: Good ?  ?  ?Standing balance support: No upper extremity supported, During functional activity ?Standing balance-Leahy Scale: Fair ?Standing balance comment: able to perform static standing balance with Min Guard A ?  ?  ?  ?  ?  ?  ?  ?  ?  ?  ?  ?   ? ?ADL either performed or assessed with clinical judgement  ? ?ADL Overall ADL's : Needs assistance/impaired ?  ?  ?Grooming: Oral care;Wash/dry face;Min guard;Standing ?  ?  ?  ?  ?  ?  ?  ?  ?  ?Toilet Transfer: Min guard;Ambulation;Rolling walker (2 wheels) ?  ?  ?  ?  ?  ?Functional mobility during ADLs: Minimal assistance;Rolling walker (2 wheels) ?General ADL Comments: Pt performing grooming tasks at sink with Min Guard A. Min Guard A for sit<>stand and Min A for balance during functional mobility. ?  ? ?Extremity/Trunk Assessment Upper Extremity Assessment ?Upper Extremity Assessment: Generalized weakness ?  ?Lower Extremity Assessment ?Lower Extremity Assessment: Defer to PT evaluation ?  ?  ?  ? ?Vision   ?  ?  ?Perception   ?  ?Praxis   ?  ? ?Cognition Arousal/Alertness: Awake/alert ?Behavior During Therapy: [facility] for tasks assessed/performed ?Overall Cognitive Status: Impaired/Different from baseline ?Area of Impairment:  Orientation, Attention, Memory, Following commands, Safety/judgement, Awareness, Problem solving ?  ?  ?  ?  ?  ?  ?  ?  ?Orientation Level: Disoriented to, Place, Time, Situation ?Current Attention Level: Sustained ?Memory: Decreased  short-term memory, Decreased recall of precautions ?Following Commands: Follows one step commands consistently ?Safety/Judgement: Decreased awareness of safety, Decreased awareness of deficits ?Awareness: Intellectual ?Problem Solving: Requires verbal cues ?General Comments: Pt easily distracted ?  ?  ?   ?Exercises   ? ?  ?Shoulder Instructions   ? ? ?  ?General Comments HR elevating to 150s with basic movement.  ? ? ?Pertinent Vitals/ Pain       Pain Assessment ?Pain Assessment: Faces ?Faces Pain Scale: Hurts little more ?Pain Location: LLE ?Pain Descriptors / Indicators: Discomfort, Aching ?Pain Intervention(s): Monitored during session, Repositioned, Patient requesting pain meds-RN notified ? ?Home Living   ?  ?  ?  ?  ?  ?  ?  ?  ?  ?  ?  ?  ?  ?  ?  ?  ?  ?  ? ?  ?Prior Functioning/Environment    ?  ?  ?  ?   ? ?Frequency ? Min 2X/week  ? ? ? ? ?  ?Progress Toward Goals ? ?OT Goals(current goals can now be found in the care plan section) ? Progress towards OT goals: Progressing toward goals ? ?Acute Rehab OT Goals ?OT Goal Formulation: Patient unable to participate in goal setting ?Time For Goal Achievement: 03/24/22 ?Potential to Achieve Goals: Fair  ?Plan Discharge plan remains appropriate   ? ?Co-evaluation ? ? ?   ?  ?  ?  ?  ? ?  ?AM-PAC OT "6 Clicks" Daily Activity     ?Outcome Measure ? ? Help from another person eating meals?: A Little ?Help from another person taking care of personal grooming?: A Little ?Help from another person toileting, which includes using toliet, bedpan, or urinal?: A Lot ?Help from another person bathing (including washing, rinsing, drying)?: A Lot ?Help from another person to put on and taking off regular upper body clothing?: A Little ?Help from another person to put on and taking off regular lower body clothing?: A Lot ?6 Click Score: 15 ? ?  ?End of Session Equipment Utilized During Treatment: Rolling walker (2 wheels);Gait belt ? ?OT Visit Diagnosis: Unsteadiness on feet  (R26.81);Other abnormalities of gait and mobility (R26.89);Muscle weakness (generalized) (M62.81) ?  ?Activity Tolerance Patient tolerated treatment well ?  ?Patient Left in bed;with call bell/phone within reach;with bed alarm set ?  ?Nurse Communication Mobility status ?  ? ?   ? ?Time: 5597-4163 ?OT Time Calculation (min): 24 min ? ?Charges: OT General Charges ?$OT Visit: 1 Visit ?OT Treatments ?$Self Care/Home Management : 23-37 mins ? ?Amanat Hackel MSOT, OTR/L ?Acute Rehab ?Pager: [phone] ?Office: [phone] ? ?Yahye Siebert M Samona Chihuahua ?03/13/2022, 2:55 PM ? ? ?

## 2022-03-13 NOTE — Progress Notes (Signed)
? ?   ? ?  RE:   Sherry Baker     ?Date of Birth:  [birth year]   ?Date:   03/13/2022    ? ? ?To Whom It May Concern: ? ?Please be advised that the above-named patient will require a short-term nursing home stay - anticipated 30 days or less for rehabilitation and strengthening.  The plan is for return home. ? ? ?

## 2022-03-13 NOTE — Plan of Care (Signed)

## 2022-03-13 NOTE — Progress Notes (Signed)
Speech Language Pathology Treatment: Dysphagia  ?Patient Details ?Name: Sherry Baker ?MRN: [id number] ?DOB: [birth year] ?Today's Date: 03/13/2022 ?Time: 1040-1055 ?SLP Time Calculation (min) (ACUTE ONLY): 15 min ? ?Assessment / Plan / Recommendation ?Clinical Impression ? Patient seen by SLP for skilled treatment focused on dysphagia goals. Patient was lying in bed and when awakened by SLP, she was alert, cooperative but c/o stomach upset. Patient not oriented to place or time, telling SLP she was "on [address]" at "the apartment". She repeatedly asked SLP to get her some beer and cigarettes. When SLP would try to reorient her to place, she did not demonstrate understanding or retention of this information. SLP looked at her breakfast meal tray which was in room and approximately 60-70% of food on plate had been eaten. She declined to have anything to drink but did request "strawberry" and SLP retrieved a strawberry italian ice. Patient exhibited a couple instances of dry sounding cough while eating italian ice but overall she appears to be tolerating PO's well. SLP is recommending continue with Dys 3 solids, thin liquids as with her poor dentition and patient's report during initial evaluation, this appears to be least restrictive diet. SLP will s/o at this time but please reorder if patient exhibiting any further s/s of dysphagia. ? ?  ?HPI HPI: Patient is a 56 y.o. female with PMH: chronic pain syndrome on chronic opioid therapy, acquired hypothyroidism, hypertension, colostomy, who is admitted to [facility] on 03/05/2022 with acute encephalopathy after presenting from home to [facility] ED for evaluation of altered mental status. She became combative on way to ED via EMS and required IV Versed via EMS. In ED she was afebrile, HR: 125 (decreased to 106 with IV fluids), UDS positive for cocaine and benzodiazepines, negative for opiods. CXR showed no evidence of cardiopulmonary process, CT head showed no evidence for  acute intracranial process. Patient was admitted with acute encephalopathy with presentation also notable for UTI. ?  ?   ?SLP Plan ? All goals met;Discharge SLP treatment due to (comment) ? ?  ?  ?Recommendations for follow up therapy are one component of a multi-disciplinary discharge planning process, led by the attending physician.  Recommendations may be updated based on patient status, additional functional criteria and insurance authorization. ?  ? ?Recommendations  ?Diet recommendations: Dysphagia 3 (mechanical soft);Thin liquid ?Liquids provided via: Cup;Straw ?Medication Administration: Whole meds with liquid ?Supervision: Patient able to self feed;Intermittent supervision to cue for compensatory strategies ?Compensations: Minimize environmental distractions;Slow rate;Small sips/bites ?Postural Changes and/or Swallow Maneuvers: Seated upright 90 degrees  ?   ?    ?   ? ? ? ? Oral Care Recommendations: Oral care BID ?Follow Up Recommendations: No SLP follow up ?Assistance recommended at discharge: None ?SLP Visit Diagnosis: Dysphagia, unspecified (R13.10) ?Plan: All goals met;Discharge SLP treatment due to (comment) ? ? ? ? ?  ?  ? ? ?Sonia Baller, MA, CCC-SLP ?Speech Therapy ? ?

## 2022-03-13 NOTE — Progress Notes (Signed)
?      ?                 PROGRESS NOTE ? ?      ?PATIENT DETAILS ?Name: Sherry Baker ?Age: 56 y.o. ?Sex: female ?Date of Birth: May 29, 1966 ?Admit Date: 03/05/2022 ?Admitting Physician Lanier Clam, MD ?PCP:Pcp, No ? ?Brief Summary: ?Patient is a 56 y.o.  female with history of cocaine/EtOH use, colon cancer-s/p ostomy-numerous recent hospitalization for severe altered mental status requiring intubation/ICU level of care-presented to the hospital on 5/3 with altered mental status-patient was subsequently admitted to the hospitalist service-posthospitalization-she had worsening of mental status-requiring intubation and transferred to the ICU.  She was subsequently found to have sepsis due to Enterococcus bacteremia.  Upon stability-she was transferred back to [facility] on 5/11. ? ? ?Significant events: ?5/3>> admit to [facility] for altered mental status ?5/4>> worsening agitation-impending respiratory failure-intubated-transferred to ICU. ? ?Significant studies: ?5/3>> CXR: No PNA ?5/3>> CT head: No acute intracranial abnormality ?5/3>> CT C-spine: No acute fracture/dislocation ?CT chest/abdomen/pelvis: No acute traumatic findings in the chest, abdomen or pelvis.  7 mm right groundglass pulmonary nodule. ? ?Significant microbiology data: ?5/3>> blood culture: Enterococcus faecalis/Staphylococcus hemolyticus (1/2) ?5/5>> blood culture: Negative ? ?Procedures: ?5/4>>5/6: ETT ?5/9>> TEE: No endocarditis ? ? ?Consults: ?ID, PCCM ? ?Subjective: ?Lying comfortably in bed-denies any chest pain or shortness of breath. ? ?Objective: ?Vitals: ?Blood pressure (!) 148/95, pulse (!) 110, temperature 98.3 ?F (36.8 ?C), temperature source Oral, resp. rate 20, height '5\' 2"'  (1.575 m), weight 43.1 kg, SpO2 96 %.  ? ?Exam: ?Gen Exam:Alert awake-not in any distress ?HEENT:atraumatic, normocephalic ?Chest: B/L clear to auscultation anteriorly ?CVS:S1S2 regular ?Abdomen:soft non tender, non distended ?Extremities:no edema ?Neurology: Non  focal ?Skin: no rash ? ?Pertinent Labs/Radiology: ? ?  Latest Ref Rng & Units 03/13/2022  ?  8:02 AM 03/11/2022  ?  6:38 AM 03/09/2022  ? 10:20 AM  ?CBC  ?WBC 4.0 - 10.5 K/uL 5.1   3.6   4.2    ?Hemoglobin 12.0 - 15.0 g/dL 11.1   10.7   9.8    ?Hematocrit 36.0 - 46.0 % 35.2   32.5   31.3    ?Platelets 150 - 400 K/uL 283   185   172    ?  ?Lab Results  ?Component Value Date  ? NA 136 03/13/2022  ? K 5.1 03/13/2022  ? CL 104 03/13/2022  ? CO2 21 (L) 03/13/2022  ?  ? ? ?Assessment/Plan: ?Severe sepsis due to Enterococcus faecalis bacteremia: Sepsis physiology has resolved-repeat cultures negative so far-TEE without endocarditis-ID recommending a total of 10-14 days of antimicrobial therapy-now on Augmentin. ? ?Acute metabolic encephalopathy: Suspect due to combination of sepsis and drug withdrawal (cocaine/alcohol).  Much better-continue Depakote.  Neuroimaging and negative-see above. ? 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 ? ?AKI on CKD stage IIIb: Slight bump in creatinine today-volume status appears to be stable-avoid nephrotoxic agents-repeat electrolytes tomorrow. ? ?COPD: Not in exacerbation-continue bronchodilators. ? ?HTN: BP stable-continue transdermal nicotine.  Ontinue Norvasc-we will resume her home dose of clonidine on discharge. ?  ?Anxiety disorder: Appears stable-we will minimize benzodiazepine-previously she was on BuSpar which will be resumed. ?  ?Chronic sacral stage IV pressure injury: Continue wound care as prior-this is a chronic issue. ? ?History of colon cancer-s/p colostomy in place ?  ?Cocaine use: Counseled extensively ? ?History of EtOH use: Should be out of window for withdrawal symptoms. ?  ?Debility/deconditioning: Chronic issue-worsened by acute  illness-await PT/OT evaluation.   ?  ? ?Nutrition Status: ?Nutrition Problem: Severe Malnutrition ?Etiology: social / environmental circumstances (polysubstance abuse) ?Signs/Symptoms: severe muscle depletion, severe  fat depletion ?Interventions: Refer to RD note for recommendations  ? ?Pressure Ulcer: ?Pressure Injury 03/06/22 Sacrum Stage 4 - Full thickness tissue loss with exposed bone, tendon or muscle. (Active)  ?03/06/22 1900  ?Location: Sacrum  ?Location Orientation:   ?Staging: Stage 4 - Full thickness tissue loss with exposed bone, tendon or muscle.  ?Wound Description (Comments):   ?Present on Admission: Yes  ?Dressing Type Foam - Lift dressing to assess site every shift 03/13/22 0820  ? ?BMI ?Estimated body mass index is 17.38 kg/m? as calculated from the following: ?  Height as of this encounter: '5\' 2"'  (1.575 m). ?  Weight as of this encounter: 43.1 kg.  ? ?Code status: ?  Code Status: Full Code  ? ?DVT Prophylaxis: ?enoxaparin (LOVENOX) injection 30 mg Start: 03/08/22 1000 ?SCDs Start: 03/05/22 2332 ?  ? ?Family Communication: None at bedside ? ? ?Disposition Plan: ?Status is: Inpatient ?Remains inpatient appropriate because: Resolving encephalopathy/AKI/sepsis-noted stable for discharge.  May require SNF on discharge. ?  ?Planned Discharge Destination:Skilled nursing facility versus home health ? ? ?Diet: ?Diet Order   ? ?       ?  DIET DYS 3 Room service appropriate? No; Fluid consistency: Thin  Diet effective now       ?  ? ?  ?  ? ?  ?  ? ? ?Antimicrobial agents: ?Anti-infectives (From admission, onward)  ? ? Start     Dose/Rate Route Frequency Ordered Stop  ? 03/12/22 1800  amoxicillin-clavulanate (AUGMENTIN) 875-125 MG per tablet 1 tablet       ? 1 tablet Oral Every 12 hours 03/12/22 1408    ? 03/08/22 0845  cefTRIAXone (ROCEPHIN) 2 g in sodium chloride 0.9 % 100 mL IVPB  Status:  Discontinued       ? 2 g ?200 mL/hr over 30 Minutes Intravenous Every 12 hours 03/08/22 0758 03/11/22 1334  ? 03/07/22 1800  ampicillin (OMNIPEN) 2 g in sodium chloride 0.9 % 100 mL IVPB  Status:  Discontinued       ? 2 g ?300 mL/hr over 20 Minutes Intravenous Every 6 hours 03/07/22 1004 03/12/22 1408  ? 03/06/22 1730  meropenem  (MERREM) 1 g in sodium chloride 0.9 % 100 mL IVPB  Status:  Discontinued       ? 1 g ?200 mL/hr over 30 Minutes Intravenous Every 12 hours 03/06/22 1634 03/07/22 1004  ? 03/06/22 0015  cefTRIAXone (ROCEPHIN) 1 g in sodium chloride 0.9 % 100 mL IVPB  Status:  Discontinued       ? 1 g ?200 mL/hr over 30 Minutes Intravenous Every 24 hours 03/06/22 0003 03/06/22 1634  ? ?  ? ? ? ?MEDICATIONS: ?Scheduled Meds: ? amoxicillin-clavulanate  1 tablet Oral Q12H  ? artificial tears   Both Eyes BID  ? chlorhexidine gluconate (MEDLINE KIT)  15 mL Mouth Rinse BID  ? Chlorhexidine Gluconate Cloth  6 each Topical Daily  ? cloNIDine  0.1 mg Transdermal Weekly  ? divalproex  500 mg Oral Q12H  ? docusate sodium  100 mg Oral BID  ? enoxaparin (LOVENOX) injection  30 mg Subcutaneous Q24H  ? feeding supplement  237 mL Oral TID BM  ? folic acid  1 mg Oral Daily  ? leptospermum manuka honey  1 application. Topical Daily  ? multivitamin with minerals  1  tablet Oral Daily  ? polyethylene glycol  17 g Oral Daily  ? sodium chloride flush  10-40 mL Intracatheter Q12H  ? thiamine  100 mg Oral Daily  ? Or  ? thiamine  100 mg Intravenous Daily  ? umeclidinium-vilanterol  1 puff Inhalation Daily  ? ?Continuous Infusions: ? sodium chloride Stopped (03/12/22 0420)  ? ?PRN Meds:.acetaminophen **OR** acetaminophen, albuterol, dextrose, haloperidol lactate, ipratropium-albuterol, LORazepam, oxyCODONE-acetaminophen **AND** oxyCODONE, sodium chloride flush ? ? ?I have personally reviewed following labs and imaging studies ? ?LABORATORY DATA: ?CBC: ?Recent Labs  ?Lab 03/07/22 ?0220 03/08/22 ?0256 03/09/22 ?1020 03/11/22 ?4730 03/13/22 ?0802  ?WBC 4.3 3.9* 4.2 3.6* 5.1  ?NEUTROABS  --   --   --  2.0  --   ?HGB 9.1* 9.4* 9.8* 10.7* 11.1*  ?HCT 28.7* 31.2* 31.3* 32.5* 35.2*  ?MCV 87.5 90.7 88.9 82.9 84.8  ?PLT 193 184 172 185 283  ? ? ?Basic Metabolic Panel: ?Recent Labs  ?Lab 03/10/22 ?0601 03/10/22 ?1848 03/11/22 ?8569 03/11/22 ?1632 03/12/22 ?4370  03/12/22 ?1638 03/13/22 ?0802  ?NA 138 137 136  --  139  --  136  ?K 3.5 3.6 4.1  --  3.2*  --  5.1  ?CL 109 98 101  --  99  --  104  ?CO2 18* 28 25  --  25  --  21*  ?GLUCOSE 108* 367* 117*  --  85  --  90  ?BUN 5* <5* <

## 2022-03-14 LAB — GLUCOSE, CAPILLARY
Glucose-Capillary: 98 mg/dL (ref 70–99)
Glucose-Capillary: 95 mg/dL (ref 70–99)
Glucose-Capillary: 122 mg/dL — ABNORMAL HIGH (ref 70–99)
Glucose-Capillary: 107 mg/dL — ABNORMAL HIGH (ref 70–99)
Glucose-Capillary: 110 mg/dL — ABNORMAL HIGH (ref 70–99)
Glucose-Capillary: 122 mg/dL — ABNORMAL HIGH (ref 70–99)

## 2022-03-14 LAB — BASIC METABOLIC PANEL
Anion gap: 10 (ref 5–15)
BUN: 14 mg/dL (ref 6–20)
CO2: 22 mmol/L (ref 22–32)
Calcium: 9.4 mg/dL (ref 8.9–10.3)
Chloride: 103 mmol/L (ref 98–111)
Creatinine, Ser: 1.93 mg/dL — ABNORMAL HIGH (ref 0.44–1.00)
GFR, Estimated: 30 mL/min — ABNORMAL LOW (ref >60)
Glucose, Bld: 102 mg/dL — ABNORMAL HIGH (ref 70–99)
Potassium: 4.8 mmol/L (ref 3.5–5.1)
Sodium: 135 mmol/L (ref 135–145)

## 2022-03-14 MED ORDER — AMOXICILLIN-POT CLAVULANATE 500-125 MG PO TABS
1.0000 | ORAL_TABLET | Freq: Two times a day (BID) | ORAL | Status: DC
  Administered 2022-03-14 – 2022-03-15 (×2): 500 mg via ORAL
  Filled 2022-03-14 (×3): qty 1

## 2022-03-14 NOTE — Progress Notes (Signed)
?      ?                 PROGRESS NOTE ? ?      ?PATIENT DETAILS ?Name: Sherry Baker ?Age: 56 y.o. ?Sex: female ?Date of Birth: 01/09/1966 ?Admit Date: 03/05/2022 ?Admitting Physician Lanier Clam, MD ?PCP:Pcp, No ? ?Brief Summary: ?Patient is a 56 y.o.  female with history of cocaine/EtOH use, colon cancer-s/p ostomy-numerous recent hospitalization for severe altered mental status requiring intubation/ICU level of care-presented to the hospital on 5/3 with altered mental status-patient was subsequently admitted to the hospitalist service-posthospitalization-she had worsening of mental status-requiring intubation and transferred to the ICU.  She was subsequently found to have sepsis due to Enterococcus bacteremia.  Upon stability-she was transferred back to [facility] on 5/11. ? ? ?Significant events: ?5/3>> admit to [facility] for altered mental status ?5/4>> worsening agitation-impending respiratory failure-intubated-transferred to ICU. ? ?Significant studies: ?5/3>> CXR: No PNA ?5/3>> CT head: No acute intracranial abnormality ?5/3>> CT C-spine: No acute fracture/dislocation ?CT chest/abdomen/pelvis: No acute traumatic findings in the chest, abdomen or pelvis.  7 mm right groundglass pulmonary nodule. ? ?Significant microbiology data: ?5/3>> blood culture: Enterococcus faecalis/Staphylococcus hemolyticus (1/2) ?5/5>> blood culture: Negative ? ?Procedures: ?5/4>>5/6: ETT ?5/9>> TEE: No endocarditis ? ? ?Consults: ?ID, PCCM ? ?Subjective: ?Lying comfortably in bed-denies any chest pain or shortness of breath. ? ?Objective: ?Vitals: ?Blood pressure 98/82, pulse (!) 116, temperature 97.7 ?F (36.5 ?C), temperature source Axillary, resp. rate 19, height '5\' 2"'  (1.575 m), weight 42 kg, SpO2 100 %.  ? ?Exam: ?Gen Exam:Alert awake-not in any distress ?HEENT:atraumatic, normocephalic ?Chest: B/L clear to auscultation anteriorly ?CVS:S1S2 regular ?Abdomen:soft non tender, non distended ?Extremities:no edema ?Neurology: Non  focal ?Skin: no rash ? ?Pertinent Labs/Radiology: ? ?  Latest Ref Rng & Units 03/13/2022  ?  8:02 AM 03/11/2022  ?  6:38 AM 03/09/2022  ? 10:20 AM  ?CBC  ?WBC 4.0 - 10.5 K/uL 5.1   3.6   4.2    ?Hemoglobin 12.0 - 15.0 g/dL 11.1   10.7   9.8    ?Hematocrit 36.0 - 46.0 % 35.2   32.5   31.3    ?Platelets 150 - 400 K/uL 283   185   172    ?  ?Lab Results  ?Component Value Date  ? NA 135 03/14/2022  ? K 4.8 03/14/2022  ? CL 103 03/14/2022  ? CO2 22 03/14/2022  ? ?  ? ? ?Assessment/Plan: ?Severe sepsis due to Enterococcus faecalis bacteremia: Sepsis physiology has resolved-repeat cultures negative so far-TEE without endocarditis-ID recommending a total of 10-14 days of antimicrobial therapy-now on Augmentin. ? ?Acute metabolic encephalopathy: Suspect due to combination of sepsis and drug withdrawal (cocaine/alcohol).  Although improved-still somewhat confused-remains on Depakote.  Neuroimaging negative -see above. ? ?AKI on CKD stage IIIb: Likely hemodynamically mediated-slowly improving-continue to avoid nephrotoxic agents.   ? ?COPD: Not in exacerbation-continue bronchodilators. ? ?HTN: BP stable-continue transdermal nicotine.  Ontinue Norvasc-we will resume her home dose of clonidine on discharge. ?  ?Anxiety disorder: Appears stable-continue BuSpar. ? ?Chronic sacral stage IV pressure injury: Continue wound care as prior-this is a chronic issue. ? ?History of colon cancer-s/p colostomy in place ?  ?Cocaine use: Counseled extensively ? ?History of EtOH use: Should be out of window for withdrawal symptoms. ?  ?Debility/deconditioning: Chronic issue-worsened by acute illness-recommendations are for SNF. ?  ? ?Nutrition Status: ?Nutrition Problem: Severe Malnutrition ?Etiology: social / environmental circumstances (polysubstance abuse) ?Signs/Symptoms: severe muscle depletion,  severe fat depletion ?Interventions: Refer to RD note for recommendations  ? ?Pressure Ulcer: ?Pressure Injury 03/06/22 Sacrum Stage 4 - Full thickness  tissue loss with exposed bone, tendon or muscle. (Active)  ?03/06/22 1900  ?Location: Sacrum  ?Location Orientation:   ?Staging: Stage 4 - Full thickness tissue loss with exposed bone, tendon or muscle.  ?Wound Description (Comments):   ?Present on Admission: Yes  ?Dressing Type Foam - Lift dressing to assess site every shift 03/13/22 2100  ? ?BMI ?Estimated body mass index is 16.94 kg/m? as calculated from the following: ?  Height as of this encounter: '5\' 2"'  (1.575 m). ?  Weight as of this encounter: 42 kg.  ? ?Code status: ?  Code Status: Full Code  ? ?DVT Prophylaxis: ?enoxaparin (LOVENOX) injection 30 mg Start: 03/08/22 1000 ?SCDs Start: 03/05/22 2332 ?  ? ?Family Communication: None at bedside ? ? ?Disposition Plan: ?Status is: Inpatient ?Remains inpatient appropriate because: Resolving encephalopathy/AKI/sepsis-noted stable for discharge.  May require SNF on discharge. ?  ?Planned Discharge Destination:Skilled nursing facility versus home health ? ? ?Diet: ?Diet Order   ? ?       ?  DIET DYS 3 Room service appropriate? No; Fluid consistency: Thin  Diet effective now       ?  ? ?  ?  ? ?  ?  ? ? ?Antimicrobial agents: ?Anti-infectives (From admission, onward)  ? ? Start     Dose/Rate Route Frequency Ordered Stop  ? 03/12/22 1800  amoxicillin-clavulanate (AUGMENTIN) 875-125 MG per tablet 1 tablet       ? 1 tablet Oral Every 12 hours 03/12/22 1408    ? 03/08/22 0845  cefTRIAXone (ROCEPHIN) 2 g in sodium chloride 0.9 % 100 mL IVPB  Status:  Discontinued       ? 2 g ?200 mL/hr over 30 Minutes Intravenous Every 12 hours 03/08/22 0758 03/11/22 1334  ? 03/07/22 1800  ampicillin (OMNIPEN) 2 g in sodium chloride 0.9 % 100 mL IVPB  Status:  Discontinued       ? 2 g ?300 mL/hr over 20 Minutes Intravenous Every 6 hours 03/07/22 1004 03/12/22 1408  ? 03/06/22 1730  meropenem (MERREM) 1 g in sodium chloride 0.9 % 100 mL IVPB  Status:  Discontinued       ? 1 g ?200 mL/hr over 30 Minutes Intravenous Every 12 hours 03/06/22  1634 03/07/22 1004  ? 03/06/22 0015  cefTRIAXone (ROCEPHIN) 1 g in sodium chloride 0.9 % 100 mL IVPB  Status:  Discontinued       ? 1 g ?200 mL/hr over 30 Minutes Intravenous Every 24 hours 03/06/22 0003 03/06/22 1634  ? ?  ? ? ? ?MEDICATIONS: ?Scheduled Meds: ? amoxicillin-clavulanate  1 tablet Oral Q12H  ? artificial tears   Both Eyes BID  ? busPIRone  5 mg Oral TID  ? chlorhexidine gluconate (MEDLINE KIT)  15 mL Mouth Rinse BID  ? Chlorhexidine Gluconate Cloth  6 each Topical Daily  ? cloNIDine  0.1 mg Transdermal Weekly  ? divalproex  500 mg Oral Q12H  ? docusate sodium  100 mg Oral BID  ? enoxaparin (LOVENOX) injection  30 mg Subcutaneous Q24H  ? feeding supplement  237 mL Oral TID BM  ? folic acid  1 mg Oral Daily  ? leptospermum manuka honey  1 application. Topical Daily  ? multivitamin with minerals  1 tablet Oral Daily  ? polyethylene glycol  17 g Oral Daily  ? sodium chloride flush  10-40 mL Intracatheter Q12H  ? thiamine  100 mg Oral Daily  ? Or  ? thiamine  100 mg Intravenous Daily  ? umeclidinium-vilanterol  1 puff Inhalation Daily  ? ?Continuous Infusions: ? sodium chloride Stopped (03/12/22 0420)  ? ?PRN Meds:.acetaminophen **OR** acetaminophen, albuterol, dextrose, haloperidol lactate, ipratropium-albuterol, LORazepam, oxyCODONE-acetaminophen **AND** oxyCODONE, sodium chloride flush ? ? ?I have personally reviewed following labs and imaging studies ? ?LABORATORY DATA: ?CBC: ?Recent Labs  ?Lab 03/08/22 ?0256 03/09/22 ?1020 03/11/22 ?3882 03/13/22 ?0802  ?WBC 3.9* 4.2 3.6* 5.1  ?NEUTROABS  --   --  2.0  --   ?HGB 9.4* 9.8* 10.7* 11.1*  ?HCT 31.2* 31.3* 32.5* 35.2*  ?MCV 90.7 88.9 82.9 84.8  ?PLT 184 172 185 283  ? ? ? ?Basic Metabolic Panel: ?Recent Labs  ?Lab 03/10/22 ?0601 03/10/22 ?1848 03/11/22 ?6666 03/11/22 ?1632 03/12/22 ?4861 03/12/22 ?1638 03/13/22 ?0802 03/14/22 ?0242  ?NA 138 137 136  --  139  --  136 135  ?K 3.5 3.6 4.1  --  3.2*  --  5.1 4.8  ?CL 109 98 101  --  99  --  104 103  ?CO2 18*  28 25  --  25  --  21* 22  ?GLUCOSE 108* 367* 117*  --  85  --  90 102*  ?BUN 5* <5* <5*  --  7  --  17 14  ?CREATININE 1.27* 1.16* 1.37*  --  1.64*  --  2.07* 1.93*  ?CALCIUM 8.2* 6.9* 8.1*  --  8.7*  --  8.7* 9.4

## 2022-03-14 NOTE — Progress Notes (Signed)
Physical Therapy Treatment ?Patient Details ?Name: Sherry Baker ?MRN: [id number] ?DOB: 06-05-1966 ?Today's Date: 03/14/2022 ? ? 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 Intubated 5/4-5/6. PMH including colostomy, chronic pain, HTN, chronic opiod therapy. polysubstance abuse ? ?  ?PT Comments  ? ? Patient received in bed in fetal position sleeping. Once awake she is agreeable to PT session. Patient found to be soaking wet in bed, therefore changed her gown and stripped bed. She is mod independent with bed mobility. Transfers with min guard, ambulated 80 feet with RW and min guard. Slow pace, narrow base of support. She will continue to benefit from skilled PT while here to improve strength and activity tolerance.  ?    ?Recommendations for follow up therapy are one component of a multi-disciplinary discharge planning process, led by the attending physician.  Recommendations may be updated based on patient status, additional functional criteria and insurance authorization. ? ?Follow Up Recommendations ? Skilled nursing-short term rehab (<3 hours/day) ?  ?  ?Assistance Recommended at Discharge Frequent or constant Supervision/Assistance  ?Patient can return home with the following Direct supervision/assist for financial management;Direct supervision/assist for medications management;Assistance with cooking/housework;Help with stairs or ramp for entrance;Assist for transportation;A little help with walking and/or transfers;A little help with bathing/dressing/bathroom ?  ?Equipment Recommendations ?    ?  ?Recommendations for Other Services   ? ? ?  ?Precautions / Restrictions Precautions ?Precautions: Fall ?Precaution Comments: watch HR ?Restrictions ?Weight Bearing Restrictions: No  ?  ? ?Mobility ? Bed Mobility ?Overal bed mobility: Modified Independent ?Bed Mobility: Supine to Sit ?  ?  ?Supine to sit: Modified independent  (Device/Increase time) ?  ?  ?  ?  ? ?Transfers ?Overall transfer level: Needs assistance ?Equipment used: Rolling walker (2 wheels) ?Transfers: Sit to/from Stand ?Sit to Stand: Min guard ?  ?  ?  ?  ?  ?  ?  ? ?Ambulation/Gait ?Ambulation/Gait assistance: Min guard ?Gait Distance (Feet): 80 Feet ?Assistive device: Rolling walker (2 wheels) ?Gait Pattern/deviations: Step-through pattern, Decreased step length - right, Decreased step length - left, Shuffle, Narrow base of support ?Gait velocity: decr ?  ?  ?General Gait Details: Assist for balance and support. Verbal cues to stay closer to walker HR up to 150 with ambulation ? ? ?Stairs ?  ?  ?  ?  ?  ? ? ?Wheelchair Mobility ?  ? ?Modified Rankin (Stroke Patients Only) ?  ? ? ?  ?Balance Overall balance assessment: Needs assistance ?Sitting-balance support: Feet supported ?Sitting balance-Leahy Scale: Good ?Sitting balance - Comments: Sat EOB with UE support and supervision ?  ?Standing balance support: Bilateral upper extremity supported, During functional activity, Reliant on assistive device for balance ?Standing balance-Leahy Scale: Fair ?  ?  ?  ?  ?  ?  ?  ?  ?  ?  ?  ?  ?  ? ?  ?Cognition Arousal/Alertness: Awake/alert ?Behavior During Therapy: [facility] for tasks assessed/performed ?Overall Cognitive Status: No family/caregiver present to determine baseline cognitive functioning ?Area of Impairment: Orientation, Attention, Memory, Following commands, Safety/judgement, Awareness, Problem solving ?  ?  ?  ?  ?  ?  ?  ?  ?Orientation Level: Disoriented to, Situation ?Current Attention Level: Sustained ?Memory: Decreased short-term memory ?Following Commands: Follows one step commands consistently ?Safety/Judgement: Decreased awareness of safety, Decreased awareness of deficits ?Awareness: Intellectual ?Problem Solving: Requires verbal cues ?General  Comments: Needs several cues for safety ?  ?  ? ?  ?Exercises   ? ?  ?General Comments   ?  ?  ? ?Pertinent Vitals/Pain  Pain Assessment ?Pain Assessment: Faces ?Faces Pain Scale: Hurts a little bit ?Pain Location: bottom ?Pain Descriptors / Indicators: Discomfort ?Pain Intervention(s): Repositioned  ? ? ?Home Living   ?  ?  ?  ?  ?  ?  ?  ?  ?  ?   ?  ?Prior Function    ?  ?  ?   ? ?PT Goals (current goals can now be found in the care plan section) Acute Rehab PT Goals ?PT Goal Formulation: With patient ?Time For Goal Achievement: 03/24/22 ?Potential to Achieve Goals: Good ?Progress towards PT goals: Progressing toward goals ? ?  ?Frequency ? ? ? Min 3X/week ? ? ? ?  ?PT Plan Current plan remains appropriate  ? ? ?Co-evaluation   ?  ?  ?  ?  ? ?  ?AM-PAC PT "6 Clicks" Mobility   ?Outcome Measure ? Help needed turning from your back to your side while in a flat bed without using bedrails?: None ?Help needed moving from lying on your back to sitting on the side of a flat bed without using bedrails?: None ?Help needed moving to and from a bed to a chair (including a wheelchair)?: A Little ?Help needed standing up from a chair using your arms (e.g., wheelchair or bedside chair)?: A Little ?Help needed to walk in hospital room?: A Little ?Help needed climbing 3-5 steps with a railing? : A Lot ?6 Click Score: 19 ? ?  ?End of Session Equipment Utilized During Treatment: Gait belt ?Activity Tolerance: Patient tolerated treatment well;Patient limited by fatigue ?Patient left: in chair;with call bell/phone within reach ?Nurse Communication: Mobility status ?  ?  ? ? ?Time: 1027-2536 ?PT Time Calculation (min) (ACUTE ONLY): 23 min ? ?Charges:  $Gait Training: 23-37 mins          ?          ? ?Amanda Cockayne, PT, GCS ?03/14/22,1:59 PM ? ? ?
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
## 2022-03-15 DIAGNOSIS — G9340 Encephalopathy, unspecified: Secondary | ICD-10-CM | POA: Diagnosis not present

## 2022-03-15 DIAGNOSIS — F1410 Cocaine abuse, uncomplicated: Secondary | ICD-10-CM | POA: Diagnosis not present

## 2022-03-15 DIAGNOSIS — I10 Essential (primary) hypertension: Secondary | ICD-10-CM | POA: Diagnosis not present

## 2022-03-15 DIAGNOSIS — R7989 Other specified abnormal findings of blood chemistry: Secondary | ICD-10-CM | POA: Diagnosis not present

## 2022-03-15 LAB — BASIC METABOLIC PANEL
Anion gap: 8 (ref 5–15)
BUN: 23 mg/dL — ABNORMAL HIGH (ref 6–20)
CO2: 24 mmol/L (ref 22–32)
Calcium: 9.2 mg/dL (ref 8.9–10.3)
Chloride: 103 mmol/L (ref 98–111)
Creatinine, Ser: 1.56 mg/dL — ABNORMAL HIGH (ref 0.44–1.00)
GFR, Estimated: 39 mL/min — ABNORMAL LOW (ref >60)
Glucose, Bld: 88 mg/dL (ref 70–99)
Potassium: 4.8 mmol/L (ref 3.5–5.1)
Sodium: 135 mmol/L (ref 135–145)

## 2022-03-15 LAB — GLUCOSE, CAPILLARY
Glucose-Capillary: 82 mg/dL (ref 70–99)
Glucose-Capillary: 105 mg/dL — ABNORMAL HIGH (ref 70–99)

## 2022-03-15 MED ORDER — ALBUTEROL SULFATE HFA 108 (90 BASE) MCG/ACT IN AERS: 2.0000 | INHALATION_SPRAY | Freq: Four times a day (QID) | RESPIRATORY_TRACT | 0 refills | Status: DC | PRN

## 2022-03-15 MED ORDER — AMLODIPINE BESYLATE 5 MG PO TABS: 5.0000 mg | ORAL_TABLET | Freq: Every day | ORAL | 0 refills | Status: DC

## 2022-03-15 MED ORDER — AMLODIPINE BESYLATE 5 MG PO TABS
5.0000 mg | ORAL_TABLET | Freq: Every day | ORAL | Status: DC
  Administered 2022-03-15: 5 mg via ORAL
  Filled 2022-03-15: qty 1

## 2022-03-15 MED ORDER — AMOXICILLIN-POT CLAVULANATE 500-125 MG PO TABS
1.0000 | ORAL_TABLET | Freq: Two times a day (BID) | ORAL | 0 refills | Status: AC
Start: 2022-03-15 — End: 2022-03-22

## 2022-03-15 MED ORDER — UMECLIDINIUM-VILANTEROL 62.5-25 MCG/ACT IN AEPB: 1.0000 | INHALATION_SPRAY | Freq: Every day | RESPIRATORY_TRACT | 0 refills | Status: DC

## 2022-03-15 MED ORDER — DIVALPROEX SODIUM 125 MG PO CSDR
250.0000 mg | DELAYED_RELEASE_CAPSULE | Freq: Two times a day (BID) | ORAL | Status: DC
  Administered 2022-03-15: 250 mg via ORAL
  Filled 2022-03-15: qty 2

## 2022-03-15 NOTE — Progress Notes (Signed)
?      ?                 PROGRESS NOTE ? ?      ?PATIENT DETAILS ?Name: Sherry Baker ?Age: 56 y.o. ?Sex: female ?Date of Birth: 1965-11-12 ?Admit Date: 03/05/2022 ?Admitting Physician Lanier Clam, MD ?PCP:Pcp, No ? ?Brief Summary: ?Patient is a 56 y.o.  female with history of cocaine/EtOH use, colon cancer-s/p ostomy-numerous recent hospitalization for severe altered mental status requiring intubation/ICU level of care-presented to the hospital on 5/3 with altered mental status-patient was subsequently admitted to the hospitalist service-posthospitalization-she had worsening of mental status-requiring intubation and transferred to the ICU.  She was subsequently found to have sepsis due to Enterococcus bacteremia.  Upon stability-she was transferred back to [facility] on 5/11. ? ? ?Significant events: ?5/3>> admit to [facility] for altered mental status ?5/4>> worsening agitation-impending respiratory failure-intubated-transferred to ICU. ? ?Significant studies: ?5/3>> CXR: No PNA ?5/3>> CT head: No acute intracranial abnormality ?5/3>> CT C-spine: No acute fracture/dislocation ?CT chest/abdomen/pelvis: No acute traumatic findings in the chest, abdomen or pelvis.  7 mm right groundglass pulmonary nodule. ? ?Significant microbiology data: ?5/3>> blood culture: Enterococcus faecalis/Staphylococcus hemolyticus (1/2) ?5/5>> blood culture: Negative ? ?Procedures: ?5/4>>5/6: ETT ?5/9>> TEE: No endocarditis ? ? ?Consults: ?ID, PCCM ? ?Subjective: ?Completely awake and alert-she wants to leave the hospital.  Thinking about leaving AMA.  I asked her to stay for a few more days to ensure she continues to improve before discharge. ? ?Objective: ?Vitals: ?Blood pressure 138/67, pulse (!) 109, temperature 98.1 ?F (36.7 ?C), temperature source Oral, resp. rate 16, height '5\' 2"'  (1.575 m), weight 42.2 kg, SpO2 97 %.  ? ?Exam: ?Gen Exam:Alert awake-not in any distress ?HEENT:atraumatic, normocephalic ?Chest: B/L clear to auscultation  anteriorly ?CVS:S1S2 regular ?Abdomen:soft non tender, non distended ?Extremities:no edema ?Neurology: Non focal ?Skin: no rash  ? ?Pertinent Labs/Radiology: ? ?  Latest Ref Rng & Units 03/13/2022  ?  8:02 AM 03/11/2022  ?  6:38 AM 03/09/2022  ? 10:20 AM  ?CBC  ?WBC 4.0 - 10.5 K/uL 5.1   3.6   4.2    ?Hemoglobin 12.0 - 15.0 g/dL 11.1   10.7   9.8    ?Hematocrit 36.0 - 46.0 % 35.2   32.5   31.3    ?Platelets 150 - 400 K/uL 283   185   172    ?  ?Lab Results  ?Component Value Date  ? NA 135 03/15/2022  ? K 4.8 03/15/2022  ? CL 103 03/15/2022  ? CO2 24 03/15/2022  ? ?  ? ? ?Assessment/Plan: ?Severe sepsis due to Enterococcus faecalis bacteremia: Sepsis physiology has resolved-repeat cultures negative so far-TEE without endocarditis-ID recommending a total of 10-14 days of antimicrobial therapy-now on Augmentin. ? ?Acute metabolic encephalopathy: Suspect due to combination of sepsis and drug withdrawal (cocaine/alcohol).  Encephalopathy has completely resolved-she is awake and alert today.  Neuroimaging as above. ? ?AKI on CKD stage IIIb: Likely hemodynamically mediated-slowly improving-continue to avoid nephrotoxic agents.   ? ?COPD: Not in exacerbation-continue bronchodilators. ? ?HTN: BP stable-stopping transdermal clonidine and resuming amlodipine.  Follow and adjust. ?  ?Anxiety disorder: Appears stable-continue BuSpar. ? ?Chronic sacral stage IV pressure injury: Continue wound care as prior-this is a chronic issue. ? ?History of colon cancer-s/p colostomy in place ?  ?Cocaine use: Counseled extensively ? ?History of EtOH use: Should be out of window for withdrawal symptoms. ?  ?Debility/deconditioning: Chronic issue-worsened by acute illness-recommendations are for SNF. ? ? ?  Nutrition Status: ?Nutrition Problem: Severe Malnutrition ?Etiology: social / environmental circumstances (polysubstance abuse) ?Signs/Symptoms: severe muscle depletion, severe fat depletion ?Interventions: Refer to RD note for recommendations   ? ?Pressure Ulcer: ?Pressure Injury 03/06/22 Sacrum Stage 4 - Full thickness tissue loss with exposed bone, tendon or muscle. (Active)  ?03/06/22 1900  ?Location: Sacrum  ?Location Orientation:   ?Staging: Stage 4 - Full thickness tissue loss with exposed bone, tendon or muscle.  ?Wound Description (Comments):   ?Present on Admission: Yes  ?Dressing Type Foam - Lift dressing to assess site every shift 03/14/22 0900  ? ?BMI ?Estimated body mass index is 17.02 kg/m? as calculated from the following: ?  Height as of this encounter: '5\' 2"'  (1.575 m). ?  Weight as of this encounter: 42.2 kg.  ? ?Code status: ?  Code Status: Full Code  ? ?DVT Prophylaxis: ?enoxaparin (LOVENOX) injection 30 mg Start: 03/08/22 1000 ?SCDs Start: 03/05/22 2332 ?  ? ?Family Communication: None at bedside ? ? ?Disposition Plan: ?Status is: Inpatient ?Remains inpatient appropriate because: Resolving encephalopathy/AKI/sepsis-noted stable for discharge.  May require SNF on discharge. ?  ?Planned Discharge Destination:Skilled nursing facility versus home health ? ? ?Diet: ?Diet Order   ? ?       ?  DIET DYS 3 Room service appropriate? No; Fluid consistency: Thin  Diet effective now       ?  ? ?  ?  ? ?  ?  ? ? ?Antimicrobial agents: ?Anti-infectives (From admission, onward)  ? ? Start     Dose/Rate Route Frequency Ordered Stop  ? 03/14/22 2200  amoxicillin-clavulanate (AUGMENTIN) 500-125 MG per tablet 500 mg       ? 1 tablet Oral Every 12 hours 03/14/22 1347    ? 03/12/22 1800  amoxicillin-clavulanate (AUGMENTIN) 875-125 MG per tablet 1 tablet  Status:  Discontinued       ? 1 tablet Oral Every 12 hours 03/12/22 1408 03/14/22 1347  ? 03/08/22 0845  cefTRIAXone (ROCEPHIN) 2 g in sodium chloride 0.9 % 100 mL IVPB  Status:  Discontinued       ? 2 g ?200 mL/hr over 30 Minutes Intravenous Every 12 hours 03/08/22 0758 03/11/22 1334  ? 03/07/22 1800  ampicillin (OMNIPEN) 2 g in sodium chloride 0.9 % 100 mL IVPB  Status:  Discontinued       ? 2 g ?300  mL/hr over 20 Minutes Intravenous Every 6 hours 03/07/22 1004 03/12/22 1408  ? 03/06/22 1730  meropenem (MERREM) 1 g in sodium chloride 0.9 % 100 mL IVPB  Status:  Discontinued       ? 1 g ?200 mL/hr over 30 Minutes Intravenous Every 12 hours 03/06/22 1634 03/07/22 1004  ? 03/06/22 0015  cefTRIAXone (ROCEPHIN) 1 g in sodium chloride 0.9 % 100 mL IVPB  Status:  Discontinued       ? 1 g ?200 mL/hr over 30 Minutes Intravenous Every 24 hours 03/06/22 0003 03/06/22 1634  ? ?  ? ? ? ?MEDICATIONS: ?Scheduled Meds: ? amLODipine  5 mg Oral Daily  ? amoxicillin-clavulanate  1 tablet Oral Q12H  ? artificial tears   Both Eyes BID  ? busPIRone  5 mg Oral TID  ? chlorhexidine gluconate (MEDLINE KIT)  15 mL Mouth Rinse BID  ? Chlorhexidine Gluconate Cloth  6 each Topical Daily  ? divalproex  250 mg Oral Q12H  ? docusate sodium  100 mg Oral BID  ? enoxaparin (LOVENOX) injection  30 mg Subcutaneous Q24H  ?  feeding supplement  237 mL Oral TID BM  ? folic acid  1 mg Oral Daily  ? leptospermum manuka honey  1 application. Topical Daily  ? multivitamin with minerals  1 tablet Oral Daily  ? polyethylene glycol  17 g Oral Daily  ? sodium chloride flush  10-40 mL Intracatheter Q12H  ? thiamine  100 mg Oral Daily  ? Or  ? thiamine  100 mg Intravenous Daily  ? umeclidinium-vilanterol  1 puff Inhalation Daily  ? ?Continuous Infusions: ? sodium chloride Stopped (03/12/22 0420)  ? ?PRN Meds:.acetaminophen **OR** acetaminophen, albuterol, dextrose, haloperidol lactate, ipratropium-albuterol, LORazepam, oxyCODONE-acetaminophen **AND** oxyCODONE, sodium chloride flush ? ? ?I have personally reviewed following labs and imaging studies ? ?LABORATORY DATA: ?CBC: ?Recent Labs  ?Lab 03/09/22 ?1020 03/11/22 ?0221 03/13/22 ?0802  ?WBC 4.2 3.6* 5.1  ?NEUTROABS  --  2.0  --   ?HGB 9.8* 10.7* 11.1*  ?HCT 31.3* 32.5* 35.2*  ?MCV 88.9 82.9 84.8  ?PLT 172 185 283  ? ? ? ?Basic Metabolic Panel: ?Recent Labs  ?Lab 03/10/22 ?0601 03/10/22 ?1848 03/11/22 ?7981  03/11/22 ?1632 03/12/22 ?0254 03/12/22 ?1638 03/13/22 ?0802 03/14/22 ?8628 03/15/22 ?2417  ?NA 138   < > 136  --  139  --  136 135 135  ?K 3.5   < > 4.1  --  3.2*  --  5.1 4.8 4.8  ?CL 109   < > 101  --  99  --  104 103

## 2022-03-15 NOTE — Progress Notes (Signed)
Patient with BP 73/47(56). Dr Marlowe Sax aware. Pt to receive additional 500 ml bolus of NS. ?

## 2022-03-15 NOTE — Progress Notes (Signed)
? ? ? ?                                                      AMA ? ?Patient at this time expresses desire to leave the Hospital immidiately, patient has been warned that this is not Medically advisable at this time, and can result in Medical complications like Death and Disability, patient understands and accepts the risks involved and assumes full responsibilty of this decision. ? ? ?Oren Binet M.D on 03/15/2022 at 11:34 AM ? ?[facility] ? ?Time < 30 minutes ? ? ?

## 2022-03-15 NOTE — Discharge Summary (Signed)
? ?PATIENT DETAILS ?Name: Sherry Baker ?Age: 56 y.o. ?Sex: female ?Date of Birth: January 08, 1966 ?MRN: [id number]. ?Admitting Physician: Lanier Clam, MD ?PCP:Pcp, No ? ?Admit Date: 03/05/2022 ?Discharge date: 03/15/2022 ? ?AMA on 5/13. ? ?Recommendations for Outpatient Follow-up:  ?Follow up with PCP in 1-2 weeks ?Please obtain CMP/CBC in one week ?Has a lung nodule on prior imaging-please follow/arrange outpatient scans as indicated. ? ?Admitted From:  ?Home ? ? ?Disposition: ?Left AMA ?  ?Discharge Condition: ?fair ? ?CODE STATUS: ?  Code Status: Full Code  ? ?Diet recommendation:  ?Diet Order   ? ?       ?  Diet - low sodium heart healthy       ?  ?  DIET DYS 3 Room service appropriate? No; Fluid consistency: Thin  Diet effective now       ?  ? ?  ?  ? ?  ?  ? ?Brief Summary: ?Patient is a 56 y.o.  female with history of cocaine/EtOH use, colon cancer-s/p ostomy-numerous recent hospitalization for severe altered mental status requiring intubation/ICU level of care-presented to the hospital on 5/3 with altered mental status-patient was subsequently admitted to the hospitalist service-posthospitalization-she had worsening of mental status-requiring intubation and transferred to the ICU.  She was subsequently found to have sepsis due to Enterococcus bacteremia.  Upon stability-she was transferred back to [facility] on 5/11. ?  ?  ?Significant events: ?5/3>> admit to [facility] for altered mental status ?5/4>> worsening agitation-impending respiratory failure-intubated-transferred to ICU. ?  ?Significant studies: ?5/3>> CXR: No PNA ?5/3>> CT head: No acute intracranial abnormality ?5/3>> CT C-spine: No acute fracture/dislocation ?CT chest/abdomen/pelvis: No acute traumatic findings in the chest, abdomen or pelvis.  7 mm right groundglass pulmonary nodule. ?  ?Significant microbiology data: ?5/3>> blood culture: Enterococcus faecalis/Staphylococcus hemolyticus (1/2) ?5/5>> blood culture: Negative ?  ?Procedures: ?5/4>>5/6:  ETT ?5/9>> TEE: No endocarditis ?  ?  ?Consults: ?ID, PCCM ? ?Brief Hospital Course: ?Severe sepsis due to Enterococcus faecalis bacteremia: Sepsis physiology has resolved-repeat cultures negative so far-TEE without endocarditis-ID recommending a total of 10-14 days of antimicrobial therapy-now on Augmentin.  She has decided to leave the hospital [place] ADVICE-I have given her a week of Augmentin prescription. ?  ?Acute metabolic encephalopathy: Suspect due to combination of sepsis and drug withdrawal (cocaine/alcohol).  Encephalopathy has completely resolved-she is awake and alert today.  Neuroimaging as above.  She is leaving the hospital [place] of death/disability discussed at length-she is excepting all risks. ?  ?AKI on CKD stage IIIb: Likely hemodynamically mediated-slowly improving-continue to avoid nephrotoxic agents.   ?  ?COPD: Not in exacerbation-continue bronchodilators. ?  ?HTN: BP stable-stopping transdermal clonidine and resuming amlodipine.  Follow and adjust. ?  ?Anxiety disorder: Appears stable-continue BuSpar. ?  ?Chronic sacral stage IV pressure injury: Continue wound care as prior-this is a chronic issue. ?  ?History of colon cancer-s/p colostomy in place ?  ?Cocaine use: Counseled extensively ?  ?History of EtOH use: Should be out of window for withdrawal symptoms. ?  ?Debility/deconditioning: Chronic issue-worsened by acute illness-recommendations are for SNF.  Being the hospital [place] today. ?  ?Bilateral lung nodules: She is aware she needs repeat CT chest in 3 months-she is aware of the risk of malignancy.  See prior discharge summary. ? ?Pressure Ulcer: ?Pressure Injury 03/06/22 Sacrum Stage 4 - Full thickness tissue loss with exposed bone, tendon or muscle. (Active)  ?03/06/22 1900  ?Location: Sacrum  ?Location Orientation:   ?Staging:  Stage 4 - Full thickness tissue loss with exposed bone, tendon or muscle.  ?Wound Description (Comments):    ?Present on Admission: Yes  ?Dressing Type Foam - Lift dressing to assess site every shift 03/14/22 0900  ? ? ? ?Nutrition Status: ?Nutrition Problem: Severe Malnutrition ?Etiology: social / environmental circumstances (polysubstance abuse) ?Signs/Symptoms: severe muscle depletion, severe fat depletion ?Interventions: Refer to RD note for recommendations ?  ? ?Obesity: ?Estimated body mass index is 17.02 kg/m? as calculated from the following: ?  Height as of this encounter: '5\' 2"'$  (1.575 m). ?  Weight as of this encounter: 42.2 kg.  ? ?RN pressure injury documentation: ?Pressure Injury 03/06/22 Sacrum Stage 4 - Full thickness tissue loss with exposed bone, tendon or muscle. (Active)  ?03/06/22 1900  ?Location: Sacrum  ?Location Orientation:   ?Staging: Stage 4 - Full thickness tissue loss with exposed bone, tendon or muscle.  ?Wound Description (Comments):   ?Present on Admission: Yes  ?Dressing Type Foam - Lift dressing to assess site every shift 03/14/22 0900  ? ? ? ?Discharge Diagnoses:  ?Principal Problem: ?  Acute encephalopathy ?Active Problems: ?  UTI (urinary tract infection) ?  Hypoglycemia ?  Hypernatremia ?  Cocaine abuse ([place]) ?  High anion gap metabolic acidosis ?  Elevated serum creatinine ?  Hypercalcemia ?  Acquired hypothyroidism ?  HTN (hypertension) ?  AMS (altered mental status) ?  Endotracheally intubated ?  Protein-calorie malnutrition, severe ?  Pressure injury of skin ? ? ?Discharge Instructions: ? ?Activity:  ?As tolerated with Full fall precautions use walker/cane & assistance as needed ? ?Discharge Instructions   ? ? Call MD for:  difficulty breathing, headache or visual disturbances   Complete by: As directed ?  ? Call MD for:  extreme fatigue   Complete by: As directed ?  ? Call MD for:  severe uncontrolled pain   Complete by: As directed ?  ? Diet - low sodium heart healthy   Complete by: As directed ?  ? Discharge wound care:   Complete by: As directed ?  ? Cleanse the sacral wound  with NS. Pat dry then apply a nickel thick layer of MediHoney directly to the wound or onto a dressing. Make certain that the [facility] covers the entire wound base, but not in contact with the peri-wound skin. For deeper wounds apply a layer of the [place] into the wound and any tunneled or undermined area then cover with dry gauze and secure with foam dressing. Change dressing daily.  ? Increase activity slowly   Complete by: As directed ?  ? ?  ? ?Allergies as of 03/15/2022   ?Not on File ?  ? ?  ?Medication List  ?  ? ?TAKE these medications   ? ?albuterol 108 (90 Base) MCG/ACT inhaler ?Commonly known as: VENTOLIN HFA ?Inhale 2 puffs into the lungs every 6 (six) hours as needed for wheezing or shortness of breath. ?  ?amLODipine 5 MG tablet ?Commonly known as: NORVASC ?Take 1 tablet (5 mg total) by mouth daily. ?  ?amoxicillin-clavulanate 500-125 MG tablet ?Commonly known as: AUGMENTIN ?Take 1 tablet (500 mg total) by mouth every 12 (twelve) hours for 7 days. ?  ?umeclidinium-vilanterol 62.5-25 MCG/ACT Aepb ?Commonly known as: ANORO ELLIPTA ?Inhale 1 puff into the lungs daily. ?Start taking on: Mar 16, 2022 ?  ? ?  ? ?  ?  ? ? ?  ?Discharge Care Instructions  ?(From admission, onward)  ?  ? ? ?  ? ?  Start     Ordered  ? 03/15/22 0000  Discharge wound care:       ?Comments: Cleanse the sacral wound with NS. Pat dry then apply a nickel thick layer of MediHoney directly to the wound or onto a dressing. Make certain that the [facility] covers the entire wound base, but not in contact with the peri-wound skin. For deeper wounds apply a layer of the [place] into the wound and any tunneled or undermined area then cover with dry gauze and secure with foam dressing. Change dressing daily.  ? 03/15/22 1137  ? ?  ?  ? ?  ? ? Follow-up Information   ? ? Primary care practitioner. Schedule an appointment as soon as possible for a visit in 1 week(s).   ? ?  ?  ? ?  ?  ? ?  ? ?Not on File ? ? ?Other Procedures/Studies: ?DG  Abd 1 View ? ?Result Date: 03/06/2022 ?CLINICAL DATA:  Orogastric placement EXAM: ABDOMEN - 1 VIEW COMPARISON:  None Available. FINDINGS: Orogastric tube enters the stomach and has its tip in the antrum. IMPRESSION

## 2022-03-17 ENCOUNTER — Encounter: Payer: Self-pay | Admitting: Physician Assistant

## 2022-04-04 ENCOUNTER — Ambulatory Visit: Payer: Self-pay | Admitting: *Deleted

## 2022-04-04 ENCOUNTER — Institutional Professional Consult (permissible substitution): Payer: Medicaid Other | Admitting: Pulmonary Disease

## 2022-04-04 NOTE — Progress Notes (Deleted)
Synopsis: Referred in *** for *** by Jonetta Osgood, MD  Subjective:   PATIENT ID: Sherry Baker GENDER: female DOB: 02/01/1966, MRN: [id number]  No chief complaint on file.   HPI  ***  Past Medical History:  Diagnosis Date   Acquired hypothyroidism    Alcohol abuse    Allergy    PCNS swelling   Arthritis    Bipolar 1 disorder ([place])    Cancer ([place]) 01/21/2017   rectal cancer   Cancer ([place])    Chronic kidney disease    Chronic pain syndrome    Cirrhosis ([place])    Cirrhosis of liver ([place])    Cocaine abuse ([place])    COVID-19 virus infection 06/23/2021   Delirium tremens ([place]) 06/22/2021   Depression    Genetic testing 03/24/2017   [name] underwent genetic counseling and testing for hereditary cancer syndromes on 02/17/2017. Her results were negative for mutations in all 46 genes analyzed by [facility]'s 46-gene Common Hereditary Cancers Panel. Genes analyzed include: APC, ATM, AXIN2, BARD1, BMPR1A, BRCA1, BRCA2, BRIP1, CDH1, CDKN2A, CHEK2, CTNNA1, DICER1, EPCAM, GREM1, HOXB13, KIT, MEN1, MLH1, MSH2, MSH3, MSH6, MUTYH, NBN,   HTN (hypertension)    Hypertension    Polysubstance abuse ([place])    Rectal cancer ([place])    Suicidal ideation 02/11/2014     Family History  Problem Relation Age of Onset   Cancer Mother 46       Originating in the abdomen, otherwise unknwon   Cancer - Other Maternal Aunt        Throat     Past Surgical History:  Procedure Laterality Date   ABDOMINAL PERINEAL BOWEL RESECTION N/A 06/18/2017   Procedure: ABDOMINAL PERINEAL RESECTION ERAS PATHWAY;  Surgeon: Leighton Ruff, MD;  Location: [facility];  Service: General;  Laterality: N/A;   BUBBLE STUDY  11/29/2021   Procedure: BUBBLE STUDY;  Surgeon: Berniece Salines, DO;  Location: [place];  Service: Cardiovascular;;   COLON SURGERY     COLONOSCOPY WITH PROPOFOL Left 01/21/2017   Procedure: COLONOSCOPY WITH PROPOFOL;  Surgeon: Otis Brace, MD;  Location: [place];  Service: Gastroenterology;   Laterality: Left;   Colostomy     FRACTURE SURGERY     MANDIBLE FRACTURE SURGERY     TEE WITHOUT CARDIOVERSION N/A 11/29/2021   Procedure: TRANSESOPHAGEAL ECHOCARDIOGRAM (TEE);  Surgeon: Berniece Salines, DO;  [facility]: [facility];  Service: Cardiovascular;  Laterality: N/A;    Social History   Socioeconomic History   Marital status: Unknown    Spouse name: Not on file   Number of children: Not on file   Years of education: Not on file   Highest education level: Not on file  Occupational History   Not on file  Tobacco Use   Smoking status: Every Day    Types: Cigarettes   Smokeless tobacco: Never  Vaping Use   Vaping Use: Never used  Substance and Sexual Activity   Alcohol use: Yes    Comment: uknown   Drug use: Yes    Types: Cocaine, "Crack" cocaine    Comment: 07/28/2017 "nothing in 3 wks"   Sexual activity: Not Currently    Birth control/protection: Abstinence  Other Topics Concern   Not on file  Social History Narrative   ** Merged History Encounter **       ** Merged History Encounter **       ** Merged History Encounter **       Social Determinants of Engineer, drilling  Resource Strain: Not on file  Food Insecurity: Not on file  Transportation Needs: Not on file  Physical Activity: Not on file  Stress: Not on file  Social Connections: Not on file  Intimate Partner Violence: Not on file     No Active Allergies   Outpatient Medications Prior to Visit  Medication Sig Dispense Refill   acetaminophen (TYLENOL) 325 MG tablet Take 2 tablets (650 mg total) by mouth every 12 (twelve) hours. 60 tablet 0   albuterol (VENTOLIN HFA) 108 (90 Base) MCG/ACT inhaler Inhale 2 puffs into the lungs every 6 (six) hours as needed for wheezing or shortness of breath. 18 g 0   albuterol (VENTOLIN HFA) 108 (90 Base) MCG/ACT inhaler Inhale 2 puffs into the lungs every 6 (six) hours as needed for wheezing or shortness of breath. 6.7 g 0   amLODipine (NORVASC) 5 MG tablet Take 1  tablet (5 mg total) by mouth daily. 30 tablet 0   amLODipine (NORVASC) 5 MG tablet Take 1 tablet (5 mg total) by mouth daily. 30 tablet 0   budesonide-formoterol (SYMBICORT) 160-4.5 MCG/ACT inhaler Inhale 2 puffs into the lungs 2 (two) times daily. 10.2 g 2   busPIRone (BUSPAR) 15 MG tablet Take 1 tablet (15 mg total) by mouth 2 (two) times daily. 60 tablet 1   busPIRone (BUSPAR) 15 MG tablet Take 1 tablet (15 mg total) by mouth 2 (two) times daily. 60 tablet 0   carvedilol (COREG) 6.25 MG tablet Take 1 tablet (6.25 mg total) by mouth 2 (two) times daily with a meal. 60 tablet 3   citalopram (CELEXA) 10 MG tablet Take 1 tablet (10 mg total) by mouth daily. 30 tablet 1   cloNIDine (CATAPRES) 0.1 MG tablet Take 1 tablet (0.1 mg total) by mouth 2 (two) times daily. 60 tablet 2   diclofenac Sodium (VOLTAREN) 1 % GEL Apply 2 g topically 4 (four) times daily. 100 g 0   feeding supplement (ENSURE ENLIVE / ENSURE PLUS) LIQD Take 237 mLs by mouth 3 (three) times daily between meals. 808 mL 12   folic acid (FOLVITE) 1 MG tablet Take 1 tablet (1 mg total) by mouth daily. 30 tablet 1   hydrOXYzine (ATARAX) 10 MG tablet Take 1 tablet (10 mg total) by mouth 3 (three) times daily as needed for anxiety. 30 tablet 0   Iron, Ferrous Sulfate, 325 (65 Fe) MG TABS Take 325 mg by mouth 2 (two) times daily. 180 tablet 1   lidocaine (LIDODERM) 5 % Place 2 patches onto the skin daily. Remove & Discard patch within 12 hours or as directed by MD 30 patch 0   methocarbamol (ROBAXIN) 500 MG tablet Take 1 tablet (500 mg total) by mouth 4 (four) times daily. 90 tablet 0   Multiple Vitamins-Minerals (CERTAVITE/ANTIOXIDANTS) TABS Take 1 tablet by mouth daily. 30 tablet 6   oxyCODONE (OXY IR/ROXICODONE) 5 MG immediate release tablet Take 1 tablet (5 mg total) by mouth every 4 (four) hours. 30 tablet 0   pantoprazole (PROTONIX) 40 MG tablet Take 1 tablet (40 mg total) by mouth daily. 30 tablet 6   pregabalin (LYRICA) 25 MG capsule  Take 1 capsule (25 mg total) by mouth 3 (three) times daily. 90 capsule 0   thiamine 100 MG tablet Take 1 tablet (100 mg total) by mouth daily. 30 tablet 1   tiotropium (SPIRIVA HANDIHALER) 18 MCG inhalation capsule Place 1 capsule (18 mcg total) into inhaler and inhale daily. 30 capsule 2   traMADol (  ULTRAM) 50 MG tablet Take 1 tablet (50 mg total) by mouth every 6 (six) hours as needed for moderate pain. 30 tablet 0   umeclidinium-vilanterol (ANORO ELLIPTA) 62.5-25 MCG/ACT AEPB Inhale 1 puff into the lungs daily. 60 each 0   Vitamin D3 (VITAMIN D) 25 MCG tablet Take 1 tablet (1,000 Units total) by mouth daily. 30 tablet 3   No facility-administered medications prior to visit.    ROS   Objective:  Physical Exam   There were no vitals filed for this visit.   on *** LPM *** RA BMI Readings from Last 3 Encounters:  03/15/22 17.02 kg/m  01/30/22 18.80 kg/m  12/15/21 18.29 kg/m   Wt Readings from Last 3 Encounters:  03/15/22 93 lb 0.6 oz (42.2 kg)  01/30/22 102 lb 12.8 oz (46.6 kg)  12/15/21 100 lb (45.4 kg)     CBC    Component Value Date/Time   WBC 5.1 03/13/2022 0802   RBC 4.15 03/13/2022 0802   HGB 11.1 (L) 03/13/2022 0802   HGB 12.4 01/30/2022 1145   HGB 9.7 (L) 10/07/2017 1335   HCT 35.2 (L) 03/13/2022 0802   HCT 39.6 01/30/2022 1145   HCT 31.5 (L) 10/07/2017 1335   PLT 283 03/13/2022 0802   PLT 295 01/30/2022 1145   MCV 84.8 03/13/2022 0802   MCV 84 01/30/2022 1145   MCV 74.2 (L) 10/07/2017 1335   MCH 26.7 03/13/2022 0802   MCHC 31.5 03/13/2022 0802   RDW 18.4 (H) 03/13/2022 0802   RDW 16.1 (H) 01/30/2022 1145   RDW 20.2 (H) 10/07/2017 1335   LYMPHSABS 0.8 03/11/2022 0638   LYMPHSABS 1.5 01/30/2022 1145   LYMPHSABS 2.2 10/07/2017 1335   MONOABS 0.6 03/11/2022 0638   MONOABS 1.0 (H) 10/07/2017 1335   EOSABS 0.1 03/11/2022 0638   EOSABS 0.1 01/30/2022 1145   BASOSABS 0.0 03/11/2022 0638   BASOSABS 0.0 01/30/2022 1145   BASOSABS 0.0 10/07/2017 1335     ***  Chest Imaging: ***  Pulmonary Functions Testing Results:     View : No data to display.          FeNO: ***  Pathology: ***  Echocardiogram: ***  Heart Catheterization: ***    Assessment & Plan:   No diagnosis found.  Discussion: ***   Current Outpatient Medications:    acetaminophen (TYLENOL) 325 MG tablet, Take 2 tablets (650 mg total) by mouth every 12 (twelve) hours., Disp: 60 tablet, Rfl: 0   albuterol (VENTOLIN HFA) 108 (90 Base) MCG/ACT inhaler, Inhale 2 puffs into the lungs every 6 (six) hours as needed for wheezing or shortness of breath., Disp: 18 g, Rfl: 0   albuterol (VENTOLIN HFA) 108 (90 Base) MCG/ACT inhaler, Inhale 2 puffs into the lungs every 6 (six) hours as needed for wheezing or shortness of breath., Disp: 6.7 g, Rfl: 0   amLODipine (NORVASC) 5 MG tablet, Take 1 tablet (5 mg total) by mouth daily., Disp: 30 tablet, Rfl: 0   amLODipine (NORVASC) 5 MG tablet, Take 1 tablet (5 mg total) by mouth daily., Disp: 30 tablet, Rfl: 0   budesonide-formoterol (SYMBICORT) 160-4.5 MCG/ACT inhaler, Inhale 2 puffs into the lungs 2 (two) times daily., Disp: 10.2 g, Rfl: 2   busPIRone (BUSPAR) 15 MG tablet, Take 1 tablet (15 mg total) by mouth 2 (two) times daily., Disp: 60 tablet, Rfl: 1   busPIRone (BUSPAR) 15 MG tablet, Take 1 tablet (15 mg total) by mouth 2 (two) times daily., Disp: 60 tablet, Rfl:  0   carvedilol (COREG) 6.25 MG tablet, Take 1 tablet (6.25 mg total) by mouth 2 (two) times daily with a meal., Disp: 60 tablet, Rfl: 3   citalopram (CELEXA) 10 MG tablet, Take 1 tablet (10 mg total) by mouth daily., Disp: 30 tablet, Rfl: 1   cloNIDine (CATAPRES) 0.1 MG tablet, Take 1 tablet (0.1 mg total) by mouth 2 (two) times daily., Disp: 60 tablet, Rfl: 2   diclofenac Sodium (VOLTAREN) 1 % GEL, Apply 2 g topically 4 (four) times daily., Disp: 100 g, Rfl: 0   feeding supplement (ENSURE ENLIVE / ENSURE PLUS) LIQD, Take 237 mLs by mouth 3 (three) times daily  between meals., Disp: 237 mL, Rfl: 12   folic acid (FOLVITE) 1 MG tablet, Take 1 tablet (1 mg total) by mouth daily., Disp: 30 tablet, Rfl: 1   hydrOXYzine (ATARAX) 10 MG tablet, Take 1 tablet (10 mg total) by mouth 3 (three) times daily as needed for anxiety., Disp: 30 tablet, Rfl: 0   Iron, Ferrous Sulfate, 325 (65 Fe) MG TABS, Take 325 mg by mouth 2 (two) times daily., Disp: 180 tablet, Rfl: 1   lidocaine (LIDODERM) 5 %, Place 2 patches onto the skin daily. Remove & Discard patch within 12 hours or as directed by MD, Disp: 30 patch, Rfl: 0   methocarbamol (ROBAXIN) 500 MG tablet, Take 1 tablet (500 mg total) by mouth 4 (four) times daily., Disp: 90 tablet, Rfl: 0   Multiple Vitamins-Minerals (CERTAVITE/ANTIOXIDANTS) TABS, Take 1 tablet by mouth daily., Disp: 30 tablet, Rfl: 6   oxyCODONE (OXY IR/ROXICODONE) 5 MG immediate release tablet, Take 1 tablet (5 mg total) by mouth every 4 (four) hours., Disp: 30 tablet, Rfl: 0   pantoprazole (PROTONIX) 40 MG tablet, Take 1 tablet (40 mg total) by mouth daily., Disp: 30 tablet, Rfl: 6   pregabalin (LYRICA) 25 MG capsule, Take 1 capsule (25 mg total) by mouth 3 (three) times daily., Disp: 90 capsule, Rfl: 0   thiamine 100 MG tablet, Take 1 tablet (100 mg total) by mouth daily., Disp: 30 tablet, Rfl: 1   tiotropium (SPIRIVA HANDIHALER) 18 MCG inhalation capsule, Place 1 capsule (18 mcg total) into inhaler and inhale daily., Disp: 30 capsule, Rfl: 2   traMADol (ULTRAM) 50 MG tablet, Take 1 tablet (50 mg total) by mouth every 6 (six) hours as needed for moderate pain., Disp: 30 tablet, Rfl: 0   umeclidinium-vilanterol (ANORO ELLIPTA) 62.5-25 MCG/ACT AEPB, Inhale 1 puff into the lungs daily., Disp: 60 each, Rfl: 0   Vitamin D3 (VITAMIN D) 25 MCG tablet, Take 1 tablet (1,000 Units total) by mouth daily., Disp: 30 tablet, Rfl: 3  I spent *** minutes dedicated to the care of this patient on the date of this encounter to include pre-visit review of records,  face-to-face time with the patient discussing conditions above, post visit ordering of testing, clinical documentation with the electronic health record, making appropriate referrals as documented, and communicating necessary findings to members of the patients care team.   [name], DO [facility] 04/04/2022 8:24 AM

## 2022-04-04 NOTE — Telephone Encounter (Signed)
  Chief Complaint: rectal pain  Symptoms: hx colostomy CA, c/o rectal pain and bleeding light red in color. Pain worsening  Frequency: x 1 year Pertinent Negatives: Patient denies dizziness, severe rectal bleeding  Disposition: '[x]'$ ED /'[]'$ Urgent Care (no appt availability in office) / '[]'$ Appointment(In office/virtual)/ '[]'$  [facility]/ '[]'$ Home Care/ '[]'$ Refused Recommended Disposition /'[]'$ [place] [facility]/ '[]'$  Follow-up with PCP Additional Notes:  Recommended ED. New patient appt for [facility] already scheduled for 04/14/22.      Reason for Disposition  Patient sounds very sick or weak to the triager  Answer Assessment - Initial Assessment Questions 1. APPEARANCE of BLOOD: "What color is it?" "Is it passed separately, on the surface of the stool, or mixed in with the stool?"      Light red 2. AMOUNT: "How much blood was passed?"      na 3. FREQUENCY: "How many times has blood been passed with the stools?"      na 4. ONSET: "When was the blood first seen in the stools?" (Days or weeks)      1 year ago  5. DIARRHEA: "Is there also some diarrhea?" If Yes, ask: "How many diarrhea stools in the past 24 hours?"      na 6. CONSTIPATION: "Do you have constipation?" If Yes, ask: "How bad is it?"     na 7. RECURRENT SYMPTOMS: "Have you had blood in your stools before?" If Yes, ask: "When was the last time?" and "What happened that time?"      Yes hx CA 8. BLOOD THINNERS: "Do you take any blood thinners?" (e.g., Coumadin/warfarin, Pradaxa/dabigatran, aspirin)     Na  9. OTHER SYMPTOMS: "Do you have any other symptoms?"  (e.g., abdomen pain, vomiting, dizziness, fever)     Rectal pain, bleeding x 1 year , hx colostomy redness to rectal area 10. PREGNANCY: "Is there any chance you are pregnant?" "When was your last menstrual period?"       na  Protocols used: Rectal Bleeding-A-AH

## 2022-04-08 ENCOUNTER — Inpatient Hospital Stay (HOSPITAL_COMMUNITY)
Admission: EM | Admit: 2022-04-08 | Discharge: 2022-04-17 | DRG: 917 | Disposition: A | Discharge status: Home / Self Care | Payer: Medicaid Other | Attending: Internal Medicine | Admitting: Internal Medicine

## 2022-04-08 ENCOUNTER — Inpatient Hospital Stay (HOSPITAL_COMMUNITY): Payer: Medicaid Other

## 2022-04-08 ENCOUNTER — Emergency Department (HOSPITAL_COMMUNITY): Payer: Medicaid Other

## 2022-04-08 DIAGNOSIS — N179 Acute kidney failure, unspecified: Secondary | ICD-10-CM | POA: Diagnosis present

## 2022-04-08 DIAGNOSIS — I130 Hypertensive heart and chronic kidney disease with heart failure and stage 1 through stage 4 chronic kidney disease, or unspecified chronic kidney disease: Secondary | ICD-10-CM | POA: Diagnosis present

## 2022-04-08 DIAGNOSIS — J439 Emphysema, unspecified: Secondary | ICD-10-CM | POA: Diagnosis present

## 2022-04-08 DIAGNOSIS — R001 Bradycardia, unspecified: Secondary | ICD-10-CM | POA: Diagnosis present

## 2022-04-08 DIAGNOSIS — F1910 Other psychoactive substance abuse, uncomplicated: Secondary | ICD-10-CM

## 2022-04-08 DIAGNOSIS — E039 Hypothyroidism, unspecified: Secondary | ICD-10-CM | POA: Diagnosis present

## 2022-04-08 DIAGNOSIS — T402X1A Poisoning by other opioids, accidental (unintentional), initial encounter: Secondary | ICD-10-CM | POA: Diagnosis present

## 2022-04-08 DIAGNOSIS — L89154 Pressure ulcer of sacral region, stage 4: Secondary | ICD-10-CM | POA: Diagnosis present

## 2022-04-08 DIAGNOSIS — F17210 Nicotine dependence, cigarettes, uncomplicated: Secondary | ICD-10-CM | POA: Diagnosis present

## 2022-04-08 DIAGNOSIS — E875 Hyperkalemia: Secondary | ICD-10-CM | POA: Diagnosis not present

## 2022-04-08 DIAGNOSIS — R918 Other nonspecific abnormal finding of lung field: Secondary | ICD-10-CM | POA: Diagnosis present

## 2022-04-08 DIAGNOSIS — G928 Other toxic encephalopathy: Secondary | ICD-10-CM | POA: Diagnosis present

## 2022-04-08 DIAGNOSIS — R34 Anuria and oliguria: Secondary | ICD-10-CM | POA: Diagnosis not present

## 2022-04-08 DIAGNOSIS — R680 Hypothermia, not associated with low environmental temperature: Secondary | ICD-10-CM | POA: Diagnosis present

## 2022-04-08 DIAGNOSIS — R1310 Dysphagia, unspecified: Secondary | ICD-10-CM | POA: Diagnosis present

## 2022-04-08 DIAGNOSIS — J15212 Pneumonia due to Methicillin resistant Staphylococcus aureus: Secondary | ICD-10-CM | POA: Diagnosis not present

## 2022-04-08 DIAGNOSIS — L89159 Pressure ulcer of sacral region, unspecified stage: Secondary | ICD-10-CM

## 2022-04-08 DIAGNOSIS — T43596A Underdosing of other antipsychotics and neuroleptics, initial encounter: Secondary | ICD-10-CM | POA: Diagnosis present

## 2022-04-08 DIAGNOSIS — Z85048 Personal history of other malignant neoplasm of rectum, rectosigmoid junction, and anus: Secondary | ICD-10-CM

## 2022-04-08 DIAGNOSIS — I10 Essential (primary) hypertension: Secondary | ICD-10-CM

## 2022-04-08 DIAGNOSIS — I5022 Chronic systolic (congestive) heart failure: Secondary | ICD-10-CM | POA: Diagnosis present

## 2022-04-08 DIAGNOSIS — L89896 Pressure-induced deep tissue damage of other site: Secondary | ICD-10-CM | POA: Diagnosis present

## 2022-04-08 DIAGNOSIS — I472 Ventricular tachycardia, unspecified: Secondary | ICD-10-CM | POA: Diagnosis not present

## 2022-04-08 DIAGNOSIS — S0003XA Contusion of scalp, initial encounter: Secondary | ICD-10-CM | POA: Diagnosis present

## 2022-04-08 DIAGNOSIS — N1832 Chronic kidney disease, stage 3b: Secondary | ICD-10-CM | POA: Diagnosis present

## 2022-04-08 DIAGNOSIS — T50904A Poisoning by unspecified drugs, medicaments and biological substances, undetermined, initial encounter: Secondary | ICD-10-CM

## 2022-04-08 DIAGNOSIS — K7460 Unspecified cirrhosis of liver: Secondary | ICD-10-CM | POA: Diagnosis present

## 2022-04-08 DIAGNOSIS — R64 Cachexia: Secondary | ICD-10-CM | POA: Diagnosis present

## 2022-04-08 DIAGNOSIS — E876 Hypokalemia: Secondary | ICD-10-CM | POA: Diagnosis present

## 2022-04-08 DIAGNOSIS — T40601A Poisoning by unspecified narcotics, accidental (unintentional), initial encounter: Secondary | ICD-10-CM | POA: Diagnosis present

## 2022-04-08 DIAGNOSIS — Z79899 Other long term (current) drug therapy: Secondary | ICD-10-CM

## 2022-04-08 DIAGNOSIS — Z23 Encounter for immunization: Secondary | ICD-10-CM

## 2022-04-08 DIAGNOSIS — F319 Bipolar disorder, unspecified: Secondary | ICD-10-CM | POA: Diagnosis present

## 2022-04-08 DIAGNOSIS — S0990XA Unspecified injury of head, initial encounter: Secondary | ICD-10-CM

## 2022-04-08 DIAGNOSIS — Z933 Colostomy status: Secondary | ICD-10-CM

## 2022-04-08 DIAGNOSIS — R5381 Other malaise: Secondary | ICD-10-CM | POA: Diagnosis present

## 2022-04-08 DIAGNOSIS — F1410 Cocaine abuse, uncomplicated: Secondary | ICD-10-CM | POA: Diagnosis present

## 2022-04-08 DIAGNOSIS — Z85038 Personal history of other malignant neoplasm of large intestine: Secondary | ICD-10-CM

## 2022-04-08 DIAGNOSIS — Z681 Body mass index (BMI) 19 or less, adult: Secondary | ICD-10-CM | POA: Diagnosis not present

## 2022-04-08 DIAGNOSIS — Z7951 Long term (current) use of inhaled steroids: Secondary | ICD-10-CM

## 2022-04-08 DIAGNOSIS — E43 Unspecified severe protein-calorie malnutrition: Secondary | ICD-10-CM | POA: Diagnosis present

## 2022-04-08 DIAGNOSIS — S51811A Laceration without foreign body of right forearm, initial encounter: Secondary | ICD-10-CM | POA: Diagnosis present

## 2022-04-08 DIAGNOSIS — F419 Anxiety disorder, unspecified: Secondary | ICD-10-CM | POA: Diagnosis present

## 2022-04-08 DIAGNOSIS — Y92009 Unspecified place in unspecified non-institutional (private) residence as the place of occurrence of the external cause: Secondary | ICD-10-CM | POA: Diagnosis not present

## 2022-04-08 DIAGNOSIS — L8994 Pressure ulcer of unspecified site, stage 4: Secondary | ICD-10-CM

## 2022-04-08 DIAGNOSIS — J9601 Acute respiratory failure with hypoxia: Secondary | ICD-10-CM | POA: Diagnosis present

## 2022-04-08 DIAGNOSIS — R4182 Altered mental status, unspecified: Principal | ICD-10-CM

## 2022-04-08 DIAGNOSIS — E872 Acidosis, unspecified: Secondary | ICD-10-CM | POA: Diagnosis present

## 2022-04-08 DIAGNOSIS — G894 Chronic pain syndrome: Secondary | ICD-10-CM | POA: Diagnosis present

## 2022-04-08 DIAGNOSIS — Z8616 Personal history of COVID-19: Secondary | ICD-10-CM | POA: Diagnosis not present

## 2022-04-08 DIAGNOSIS — L8990 Pressure ulcer of unspecified site, unspecified stage: Secondary | ICD-10-CM | POA: Diagnosis present

## 2022-04-08 DIAGNOSIS — L89322 Pressure ulcer of left buttock, stage 2: Secondary | ICD-10-CM | POA: Diagnosis present

## 2022-04-08 DIAGNOSIS — L89316 Pressure-induced deep tissue damage of right buttock: Secondary | ICD-10-CM | POA: Diagnosis present

## 2022-04-08 LAB — COMPREHENSIVE METABOLIC PANEL
ALT: 11 U/L (ref 0–44)
AST: 18 U/L (ref 15–41)
Albumin: 2.8 g/dL — ABNORMAL LOW (ref 3.5–5.0)
Alkaline Phosphatase: 65 U/L (ref 38–126)
Anion gap: 12 (ref 5–15)
BUN: 26 mg/dL — ABNORMAL HIGH (ref 6–20)
CO2: 18 mmol/L — ABNORMAL LOW (ref 22–32)
Calcium: 9.3 mg/dL (ref 8.9–10.3)
Chloride: 113 mmol/L — ABNORMAL HIGH (ref 98–111)
Creatinine, Ser: 1.74 mg/dL — ABNORMAL HIGH (ref 0.44–1.00)
GFR, Estimated: 34 mL/min — ABNORMAL LOW (ref >60)
Glucose, Bld: 91 mg/dL (ref 70–99)
Potassium: 3 mmol/L — ABNORMAL LOW (ref 3.5–5.1)
Sodium: 143 mmol/L (ref 135–145)
Total Bilirubin: 0.4 mg/dL (ref 0.3–1.2)
Total Protein: 6.9 g/dL (ref 6.5–8.1)

## 2022-04-08 LAB — CBC WITH DIFFERENTIAL/PLATELET
Abs Immature Granulocytes: 0.04 10*3/uL (ref 0.00–0.07)
Basophils Absolute: 0 10*3/uL (ref 0.0–0.1)
Basophils Relative: 0 %
Eosinophils Absolute: 0.1 10*3/uL (ref 0.0–0.5)
Eosinophils Relative: 2 %
HCT: 36.8 % (ref 36.0–46.0)
Hemoglobin: 11.7 g/dL — ABNORMAL LOW (ref 12.0–15.0)
Immature Granulocytes: 1 %
Lymphocytes Relative: 15 %
Lymphs Abs: 0.6 10*3/uL — ABNORMAL LOW (ref 0.7–4.0)
MCH: 28.3 pg (ref 26.0–34.0)
MCHC: 31.8 g/dL (ref 30.0–36.0)
MCV: 89.1 fL (ref 80.0–100.0)
Monocytes Absolute: 0.3 10*3/uL (ref 0.1–1.0)
Monocytes Relative: 8 %
Neutro Abs: 3 10*3/uL (ref 1.7–7.7)
Neutrophils Relative %: 74 %
Platelets: 248 10*3/uL (ref 150–400)
RBC: 4.13 MIL/uL (ref 3.87–5.11)
RDW: 18.9 % — ABNORMAL HIGH (ref 11.5–15.5)
WBC: 4.1 10*3/uL (ref 4.0–10.5)
nRBC: 0 % (ref 0.0–0.2)

## 2022-04-08 LAB — POCT I-STAT 7, (LYTES, BLD GAS, ICA,H+H)
Acid-base deficit: 10 mmol/L — ABNORMAL HIGH (ref 0.0–2.0)
Bicarbonate: 15.5 mmol/L — ABNORMAL LOW (ref 20.0–28.0)
Calcium, Ion: 1.31 mmol/L (ref 1.15–1.40)
HCT: 33 % — ABNORMAL LOW (ref 36.0–46.0)
Hemoglobin: 11.2 g/dL — ABNORMAL LOW (ref 12.0–15.0)
O2 Saturation: 100 %
Patient temperature: 97.8
Potassium: 3.4 mmol/L — ABNORMAL LOW (ref 3.5–5.1)
Sodium: 143 mmol/L (ref 135–145)
TCO2: 16 mmol/L — ABNORMAL LOW (ref 22–32)
pCO2 arterial: 31.5 mmHg — ABNORMAL LOW (ref 32–48)
pH, Arterial: 7.297 — ABNORMAL LOW (ref 7.35–7.45)
pO2, Arterial: 346 mmHg — ABNORMAL HIGH (ref 83–108)

## 2022-04-08 LAB — AMMONIA: Ammonia: 32 umol/L (ref 9–35)

## 2022-04-08 LAB — GLUCOSE, CAPILLARY
Glucose-Capillary: 99 mg/dL (ref 70–99)
Glucose-Capillary: 81 mg/dL (ref 70–99)

## 2022-04-08 LAB — I-STAT VENOUS BLOOD GAS, ED
Acid-base deficit: 9 mmol/L — ABNORMAL HIGH (ref 0.0–2.0)
Bicarbonate: 17.2 mmol/L — ABNORMAL LOW (ref 20.0–28.0)
Calcium, Ion: 1.21 mmol/L (ref 1.15–1.40)
HCT: 35 % — ABNORMAL LOW (ref 36.0–46.0)
Hemoglobin: 11.9 g/dL — ABNORMAL LOW (ref 12.0–15.0)
O2 Saturation: 74 %
Potassium: 3.2 mmol/L — ABNORMAL LOW (ref 3.5–5.1)
Sodium: 143 mmol/L (ref 135–145)
TCO2: 18 mmol/L — ABNORMAL LOW (ref 22–32)
pCO2, Ven: 36.8 mmHg — ABNORMAL LOW (ref 44–60)
pH, Ven: 7.277 (ref 7.25–7.43)
pO2, Ven: 44 mmHg (ref 32–45)

## 2022-04-08 LAB — CBG MONITORING, ED: Glucose-Capillary: 97 mg/dL (ref 70–99)

## 2022-04-08 LAB — ACETAMINOPHEN LEVEL: Acetaminophen (Tylenol), Serum: <10 ug/mL — ABNORMAL LOW (ref 10–30)

## 2022-04-08 LAB — TROPONIN I (HIGH SENSITIVITY)
Troponin I (High Sensitivity): 26 ng/L — ABNORMAL HIGH (ref <18)
Troponin I (High Sensitivity): 26 ng/L — ABNORMAL HIGH (ref <18)

## 2022-04-08 LAB — MRSA NEXT GEN BY PCR, NASAL: MRSA by PCR Next Gen: DETECTED — AB

## 2022-04-08 LAB — CK: Total CK: 68 U/L (ref 38–234)

## 2022-04-08 LAB — LACTIC ACID, PLASMA: Lactic Acid, Venous: 1.4 mmol/L (ref 0.5–1.9)

## 2022-04-08 LAB — ETHANOL: Alcohol, Ethyl (B): <10 mg/dL (ref <10)

## 2022-04-08 LAB — SALICYLATE LEVEL: Salicylate Lvl: 14.8 mg/dL (ref 7.0–30.0)

## 2022-04-08 LAB — MAGNESIUM: Magnesium: 2 mg/dL (ref 1.7–2.4)

## 2022-04-08 LAB — OSMOLALITY: Osmolality: 308 mOsm/kg — ABNORMAL HIGH (ref 275–295)

## 2022-04-08 MED ORDER — DOCUSATE SODIUM 50 MG/5ML PO LIQD
100.0000 mg | Freq: Two times a day (BID) | ORAL | Status: DC
  Administered 2022-04-08 – 2022-04-14 (×12): 100 mg
  Filled 2022-04-08 (×12): qty 10

## 2022-04-08 MED ORDER — FENTANYL CITRATE (PF) 100 MCG/2ML IJ SOLN
50.0000 ug | INTRAMUSCULAR | Status: AC | PRN
  Administered 2022-04-12 (×3): 50 ug via INTRAVENOUS
  Filled 2022-04-08 (×2): qty 2

## 2022-04-08 MED ORDER — PROPOFOL 1000 MG/100ML IV EMUL
0.0000 ug/kg/min | INTRAVENOUS | Status: DC
  Administered 2022-04-09: 5 ug/kg/min via INTRAVENOUS
  Administered 2022-04-09 (×2): 50 ug/kg/min via INTRAVENOUS
  Administered 2022-04-10: 20 ug/kg/min via INTRAVENOUS
  Administered 2022-04-10: 40 ug/kg/min via INTRAVENOUS
  Administered 2022-04-11: 20 ug/kg/min via INTRAVENOUS
  Filled 2022-04-08 (×2): qty 100
  Filled 2022-04-08: qty 200
  Filled 2022-04-08: qty 100

## 2022-04-08 MED ORDER — FOLIC ACID 5 MG/ML IJ SOLN
1.0000 mg | Freq: Every day | INTRAMUSCULAR | Status: DC
  Administered 2022-04-08 – 2022-04-16 (×8): 1 mg via INTRAVENOUS
  Filled 2022-04-08 (×10): qty 0.2

## 2022-04-08 MED ORDER — ACETAMINOPHEN 325 MG PO TABS
650.0000 mg | ORAL_TABLET | ORAL | Status: DC | PRN
Start: 2022-04-08 — End: 2022-04-10
  Administered 2022-04-10: 650 mg via ORAL
  Filled 2022-04-08: qty 2

## 2022-04-08 MED ORDER — NALOXONE HCL 4 MG/10ML IJ SOLN
2.0000 mg/h | INTRAVENOUS | Status: DC
  Administered 2022-04-08 (×2): 2 mg/h via INTRAVENOUS
  Administered 2022-04-08: 4 mg/h via INTRAVENOUS
  Filled 2022-04-08 (×6): qty 10

## 2022-04-08 MED ORDER — CHLORHEXIDINE GLUCONATE 0.12% ORAL RINSE (MEDLINE KIT)
15.0000 mL | Freq: Two times a day (BID) | OROMUCOSAL | Status: DC
Start: 2022-04-08 — End: 2022-04-12
  Administered 2022-04-08 – 2022-04-12 (×8): 15 mL via OROMUCOSAL

## 2022-04-08 MED ORDER — MIDAZOLAM HCL 2 MG/2ML IJ SOLN
INTRAMUSCULAR | Status: AC
  Administered 2022-04-08: 2 mg via INTRAVENOUS
  Filled 2022-04-08: qty 2

## 2022-04-08 MED ORDER — FENTANYL CITRATE (PF) 100 MCG/2ML IJ SOLN
50.0000 ug | INTRAMUSCULAR | Status: DC | PRN
  Administered 2022-04-09 (×3): 100 ug via INTRAVENOUS
  Administered 2022-04-10 (×2): 200 ug via INTRAVENOUS
  Administered 2022-04-10 (×2): 100 ug via INTRAVENOUS
  Filled 2022-04-08 (×2): qty 2
  Filled 2022-04-08 (×2): qty 4
  Filled 2022-04-08 (×4): qty 2

## 2022-04-08 MED ORDER — DOCUSATE SODIUM 100 MG PO CAPS: 100.0000 mg | ORAL_CAPSULE | Freq: Two times a day (BID) | ORAL | Status: DC | PRN

## 2022-04-08 MED ORDER — LACTATED RINGERS IV SOLN: INTRAVENOUS | Status: AC

## 2022-04-08 MED ORDER — MUPIROCIN 2 % EX OINT
1.0000 "application " | TOPICAL_OINTMENT | Freq: Two times a day (BID) | CUTANEOUS | Status: AC
  Administered 2022-04-09 – 2022-04-13 (×10): 1 via NASAL
  Filled 2022-04-08 (×3): qty 22

## 2022-04-08 MED ORDER — HYDRALAZINE HCL 20 MG/ML IJ SOLN: 10.0000 mg | INTRAMUSCULAR | Status: DC | PRN

## 2022-04-08 MED ORDER — MIDAZOLAM HCL 2 MG/2ML IJ SOLN: 2.0000 mg | Freq: Once | INTRAMUSCULAR | Status: AC

## 2022-04-08 MED ORDER — ORAL CARE MOUTH RINSE
15.0000 mL | OROMUCOSAL | Status: DC
  Administered 2022-04-08 – 2022-04-12 (×37): 15 mL via OROMUCOSAL

## 2022-04-08 MED ORDER — THIAMINE HCL 100 MG/ML IJ SOLN
100.0000 mg | Freq: Every day | INTRAMUSCULAR | Status: DC
  Administered 2022-04-08 – 2022-04-16 (×9): 100 mg via INTRAVENOUS
  Filled 2022-04-08 (×9): qty 2

## 2022-04-08 MED ORDER — CHLORHEXIDINE GLUCONATE CLOTH 2 % EX PADS
6.0000 | MEDICATED_PAD | Freq: Every day | CUTANEOUS | Status: AC
  Administered 2022-04-09 – 2022-04-13 (×5): 6 via TOPICAL

## 2022-04-08 MED ORDER — IPRATROPIUM-ALBUTEROL 0.5-2.5 (3) MG/3ML IN SOLN
3.0000 mL | RESPIRATORY_TRACT | Status: DC | PRN
Start: 2022-04-08 — End: 2022-04-17
  Administered 2022-04-10: 3 mL via RESPIRATORY_TRACT
  Filled 2022-04-08: qty 3

## 2022-04-08 MED ORDER — HEPARIN SODIUM (PORCINE) 5000 UNIT/ML IJ SOLN
5000.0000 [IU] | Freq: Three times a day (TID) | INTRAMUSCULAR | Status: DC
  Administered 2022-04-08 – 2022-04-17 (×25): 5000 [IU] via SUBCUTANEOUS
  Filled 2022-04-08 (×27): qty 1

## 2022-04-08 MED ORDER — PANTOPRAZOLE 2 MG/ML SUSPENSION: 40.0000 mg | Freq: Every day | ORAL | Status: DC

## 2022-04-08 MED ORDER — NALOXONE HCL 2 MG/2ML IJ SOSY
PREFILLED_SYRINGE | INTRAMUSCULAR | Status: AC
  Filled 2022-04-08: qty 2

## 2022-04-08 MED ORDER — POLYETHYLENE GLYCOL 3350 17 G PO PACK
17.0000 g | PACK | Freq: Every day | ORAL | Status: DC
  Administered 2022-04-08 – 2022-04-14 (×7): 17 g
  Filled 2022-04-08 (×8): qty 1

## 2022-04-08 MED ORDER — FENTANYL CITRATE (PF) 100 MCG/2ML IJ SOLN
INTRAMUSCULAR | Status: AC
  Administered 2022-04-08: 100 ug via INTRAVENOUS
  Filled 2022-04-08: qty 2

## 2022-04-08 MED ORDER — IPRATROPIUM-ALBUTEROL 0.5-2.5 (3) MG/3ML IN SOLN
3.0000 mL | Freq: Once | RESPIRATORY_TRACT | Status: DC
  Filled 2022-04-08: qty 3

## 2022-04-08 MED ORDER — POTASSIUM CHLORIDE 10 MEQ/100ML IV SOLN
10.0000 meq | INTRAVENOUS | Status: AC
  Administered 2022-04-08 (×3): 10 meq via INTRAVENOUS
  Filled 2022-04-08 (×3): qty 100

## 2022-04-08 MED ORDER — NALOXONE HCL 0.4 MG/ML IJ SOLN
INTRAMUSCULAR | Status: AC
  Filled 2022-04-08: qty 1

## 2022-04-08 MED ORDER — ATROPINE SULFATE 1 MG/10ML IJ SOSY
PREFILLED_SYRINGE | INTRAMUSCULAR | Status: AC
  Filled 2022-04-08: qty 10

## 2022-04-08 MED ORDER — POLYETHYLENE GLYCOL 3350 17 G PO PACK: 17.0000 g | PACK | Freq: Every day | ORAL | Status: DC | PRN

## 2022-04-08 MED ORDER — TETANUS-DIPHTH-ACELL PERTUSSIS 5-2.5-18.5 LF-MCG/0.5 IM SUSY
0.5000 mL | PREFILLED_SYRINGE | Freq: Once | INTRAMUSCULAR | Status: AC
  Administered 2022-04-09: 0.5 mL via INTRAMUSCULAR
  Filled 2022-04-08 (×2): qty 0.5

## 2022-04-08 MED ORDER — FENTANYL CITRATE (PF) 100 MCG/2ML IJ SOLN: 100.0000 ug | Freq: Once | INTRAMUSCULAR | Status: AC

## 2022-04-08 MED ORDER — ETOMIDATE 2 MG/ML IV SOLN: 20.0000 mg | Freq: Once | INTRAVENOUS | Status: AC

## 2022-04-08 MED ORDER — PANTOPRAZOLE 2 MG/ML SUSPENSION
40.0000 mg | Freq: Every day | ORAL | Status: DC
  Administered 2022-04-08 – 2022-04-16 (×9): 40 mg
  Filled 2022-04-08 (×9): qty 20

## 2022-04-08 MED ORDER — SODIUM CHLORIDE 0.9 % IV BOLUS
1000.0000 mL | Freq: Once | INTRAVENOUS | Status: AC
  Administered 2022-04-08: 1000 mL via INTRAVENOUS

## 2022-04-08 MED ORDER — ETOMIDATE 2 MG/ML IV SOLN
INTRAVENOUS | Status: AC
  Administered 2022-04-08: 20 mg via INTRAVENOUS
  Filled 2022-04-08: qty 20

## 2022-04-08 MED ORDER — HYDRALAZINE HCL 20 MG/ML IJ SOLN
10.0000 mg | INTRAMUSCULAR | Status: DC | PRN
  Administered 2022-04-09: 10 mg via INTRAVENOUS
  Filled 2022-04-08: qty 1

## 2022-04-08 MED ORDER — NALOXONE HCL 0.4 MG/ML IJ SOLN
0.4000 mg | Freq: Once | INTRAMUSCULAR | Status: DC
  Filled 2022-04-08: qty 1

## 2022-04-08 MED ORDER — NALOXONE HCL 0.4 MG/ML IJ SOLN
INTRAMUSCULAR | Status: AC
  Filled 2022-04-08: qty 3

## 2022-04-08 MED ORDER — ONDANSETRON HCL 4 MG/2ML IJ SOLN
4.0000 mg | Freq: Four times a day (QID) | INTRAMUSCULAR | Status: DC | PRN
  Administered 2022-04-12 – 2022-04-13 (×2): 4 mg via INTRAVENOUS
  Filled 2022-04-08 (×3): qty 2

## 2022-04-08 MED ORDER — BUDESONIDE 0.25 MG/2ML IN SUSP
0.2500 mg | Freq: Two times a day (BID) | RESPIRATORY_TRACT | Status: DC
  Administered 2022-04-08: 0.25 mg via RESPIRATORY_TRACT
  Filled 2022-04-08 (×2): qty 2

## 2022-04-08 MED ORDER — NALOXONE HCL 2 MG/2ML IJ SOSY
PREFILLED_SYRINGE | INTRAMUSCULAR | Status: AC | PRN
  Administered 2022-04-08 (×2): 2 mg via INTRAVENOUS
  Administered 2022-04-08 (×2): .4 mg via INTRAVENOUS

## 2022-04-08 MED ORDER — ONDANSETRON HCL 4 MG/2ML IJ SOLN
4.0000 mg | Freq: Once | INTRAMUSCULAR | Status: AC
  Administered 2022-04-08: 4 mg via INTRAVENOUS
  Filled 2022-04-08: qty 2

## 2022-04-08 MED ORDER — PROPOFOL 1000 MG/100ML IV EMUL
INTRAVENOUS | Status: AC
  Filled 2022-04-08: qty 100

## 2022-04-08 MED ORDER — NALOXONE HCL 2 MG/2ML IJ SOSY
1.0000 mg | PREFILLED_SYRINGE | Freq: Once | INTRAMUSCULAR | Status: AC
  Administered 2022-04-08: 1 mg via INTRAVENOUS
  Filled 2022-04-08: qty 2

## 2022-04-08 MED ORDER — MIDAZOLAM HCL 2 MG/2ML IJ SOLN
1.0000 mg | INTRAMUSCULAR | Status: DC | PRN
  Administered 2022-04-09 – 2022-04-10 (×3): 1 mg via INTRAVENOUS
  Filled 2022-04-08 (×4): qty 2

## 2022-04-08 NOTE — Progress Notes (Signed)
Patient unresponsive. Occasional jerking movement and response to painful stimuli. Snoring respirations.Placed nasal trumpet. NTS for moderate amount of thick white secretions. Oral suction performed for moderate thick white, pink tinged secretions. Placed ETCO2, and venturi mask at 35%. Spo2 currently 100%. Elink on camera.

## 2022-04-08 NOTE — Progress Notes (Addendum)
Cross covering ICU physician.   Called to bedside to eval unresponsive pt despite narcan infusion and additional bolus. Admitted with overdose. She is sonorous with retractions and unresponsive to pain.   We will move to intubate at this time. Will stop the narcan infusion at this time as well.  Called contact Richardson Landry, Crystal without answer. Will reattempt later.   Update. Able to update Crystal re: intubation. All questions answered to the best of my ability.

## 2022-04-08 NOTE — Progress Notes (Signed)
[facility] Progress Note Patient Name: Sherry Baker DOB: [birth year] MRN: [id number]   Date of Service  04/08/2022  HPI/Events of Note  Patient admitted with a Narcotic overdose, on a Narcan infusion, she's obstructing her airway and having apneic spells.  eICU Interventions  Nasal trumpet inserted, 1 mg Narcan bolus given and infusion increased to 4 mg  / hour, ABG ordered, patient remains somnolent so I will ask the PCCM bedside crew to evaluate for possible intubation.        Kerry Kass Kylyn Mcdade 04/08/2022, 8:08 PM

## 2022-04-08 NOTE — ED Provider Notes (Signed)
[place] EMERGENCY DEPARTMENT Provider Note   CSN: [id number] Arrival date & time: 04/08/22  1222     History  No chief complaint on file.   Sherry Baker is a 56 y.o. female.  Patient as above with significant medical history as below, including bipolar 1, etoh abuse, polysubstance abuse, cirrhosis, chronic pain syndrome who presents to the ED with complaint of ams. Pt arrived by [facility], altered on arrival and unable to provide history. Per EMS pt was left at home by friend while he went to the store, when he returned she was unresponsive on the floor. She was still breathing and at one point was thrashing around the floor erratically. No CPR was performed. Vitals stable en route, no intervention by EMS en route.   Head trauma, AMS, concern for trauma. Will activate level 2 trauma    Past Medical History:  Diagnosis Date   Acquired hypothyroidism    Alcohol abuse    Allergy    PCNS swelling   Arthritis    Bipolar 1 disorder ([place])    Cancer ([place]) 01/21/2017   rectal cancer   Cancer ([place])    Chronic kidney disease    Chronic pain syndrome    Cirrhosis ([place])    Cirrhosis of liver ([place])    Cocaine abuse ([place])    COVID-19 virus infection 06/23/2021   Delirium tremens ([place]) 06/22/2021   Depression    Genetic testing 03/24/2017   Ms. Novicki underwent genetic counseling and testing for hereditary cancer syndromes on 02/17/2017. Her results were negative for mutations in all 46 genes analyzed by [employer]'s 46-gene Common Hereditary Cancers Panel. Genes analyzed include: APC, ATM, AXIN2, BARD1, BMPR1A, BRCA1, BRCA2, BRIP1, CDH1, CDKN2A, CHEK2, CTNNA1, DICER1, EPCAM, GREM1, HOXB13, KIT, MEN1, MLH1, MSH2, MSH3, MSH6, MUTYH, NBN,   HTN (hypertension)    Hypertension    Polysubstance abuse ([place])    Rectal cancer ([place])    Suicidal ideation 02/11/2014    Past Surgical History:  Procedure Laterality Date   ABDOMINAL PERINEAL BOWEL RESECTION N/A 06/18/2017   Procedure:  ABDOMINAL PERINEAL RESECTION ERAS PATHWAY;  Surgeon: Leighton Ruff, MD;  Location: [facility];  Service: General;  Laterality: N/A;   BUBBLE STUDY  11/29/2021   Procedure: BUBBLE STUDY;  Surgeon: Berniece Salines, DO;  Location: [facility];  Service: Cardiovascular;;   COLON SURGERY     COLONOSCOPY WITH PROPOFOL Left 01/21/2017   Procedure: COLONOSCOPY WITH PROPOFOL;  Surgeon: Otis Brace, MD;  Location: [facility];  Service: Gastroenterology;  Laterality: Left;   Colostomy     FRACTURE SURGERY     MANDIBLE FRACTURE SURGERY     TEE WITHOUT CARDIOVERSION N/A 11/29/2021   Procedure: TRANSESOPHAGEAL ECHOCARDIOGRAM (TEE);  Surgeon: Berniece Salines, DO;  Location: [facility];  Service: Cardiovascular;  Laterality: N/A;     The history is provided by the EMS personnel. The history is limited by the condition of the patient.      Home Medications Prior to Admission medications   Medication Sig Start Date End Date Taking? Authorizing Provider  acetaminophen (TYLENOL) 325 MG tablet Take 2 tablets (650 mg total) by mouth every 12 (twelve) hours. 12/13/21   Samella Parr, NP  albuterol (VENTOLIN HFA) 108 (90 Base) MCG/ACT inhaler Inhale 2 puffs into the lungs every 6 (six) hours as needed for wheezing or shortness of breath. 02/13/22   Ghimire, Henreitta Leber, MD  albuterol (VENTOLIN HFA) 108 (90 Base) MCG/ACT inhaler Inhale 2 puffs into the lungs every  6 (six) hours as needed for wheezing or shortness of breath. 03/15/22   Ghimire, Henreitta Leber, MD  amLODipine (NORVASC) 5 MG tablet Take 1 tablet (5 mg total) by mouth daily. 02/14/22   Ghimire, Henreitta Leber, MD  amLODipine (NORVASC) 5 MG tablet Take 1 tablet (5 mg total) by mouth daily. 03/15/22 03/15/23  Ghimire, Henreitta Leber, MD  budesonide-formoterol (SYMBICORT) 160-4.5 MCG/ACT inhaler Inhale 2 puffs into the lungs 2 (two) times daily. 02/13/22   Ghimire, Henreitta Leber, MD  busPIRone (BUSPAR) 15 MG tablet Take 1 tablet (15 mg total) by mouth 2 (two) times daily. 01/30/22    Argentina Donovan, PA-C  busPIRone (BUSPAR) 15 MG tablet Take 1 tablet (15 mg total) by mouth 2 (two) times daily. 02/13/22   Ghimire, Henreitta Leber, MD  carvedilol (COREG) 6.25 MG tablet Take 1 tablet (6.25 mg total) by mouth 2 (two) times daily with a meal. 01/30/22   McClung, Dionne Bucy, PA-C  citalopram (CELEXA) 10 MG tablet Take 1 tablet (10 mg total) by mouth daily. 01/30/22   Argentina Donovan, PA-C  cloNIDine (CATAPRES) 0.1 MG tablet Take 1 tablet (0.1 mg total) by mouth 2 (two) times daily. 02/13/22   Ghimire, Henreitta Leber, MD  diclofenac Sodium (VOLTAREN) 1 % GEL Apply 2 g topically 4 (four) times daily. 12/13/21   Samella Parr, NP  feeding supplement (ENSURE ENLIVE / ENSURE PLUS) LIQD Take 237 mLs by mouth 3 (three) times daily between meals. 12/13/21   Samella Parr, NP  folic acid (FOLVITE) 1 MG tablet Take 1 tablet (1 mg total) by mouth daily. 12/13/21   Samella Parr, NP  hydrOXYzine (ATARAX) 10 MG tablet Take 1 tablet (10 mg total) by mouth 3 (three) times daily as needed for anxiety. 12/13/21   Samella Parr, NP  Iron, Ferrous Sulfate, 325 (65 Fe) MG TABS Take 325 mg by mouth 2 (two) times daily. 01/31/22   Argentina Donovan, PA-C  lidocaine (LIDODERM) 5 % Place 2 patches onto the skin daily. Remove & Discard patch within 12 hours or as directed by MD 12/14/21   Samella Parr, NP  methocarbamol (ROBAXIN) 500 MG tablet Take 1 tablet (500 mg total) by mouth 4 (four) times daily. 01/30/22   Argentina Donovan, PA-C  Multiple Vitamins-Minerals (CERTAVITE/ANTIOXIDANTS) TABS Take 1 tablet by mouth daily. 12/14/21   Samella Parr, NP  oxyCODONE (OXY IR/ROXICODONE) 5 MG immediate release tablet Take 1 tablet (5 mg total) by mouth every 4 (four) hours. 12/13/21   Samella Parr, NP  pantoprazole (PROTONIX) 40 MG tablet Take 1 tablet (40 mg total) by mouth daily. 12/14/21   Samella Parr, NP  pregabalin (LYRICA) 25 MG capsule Take 1 capsule (25 mg total) by mouth 3 (three) times daily. 01/30/22    Argentina Donovan, PA-C  thiamine 100 MG tablet Take 1 tablet (100 mg total) by mouth daily. 12/13/21   Samella Parr, NP  tiotropium (SPIRIVA HANDIHALER) 18 MCG inhalation capsule Place 1 capsule (18 mcg total) into inhaler and inhale daily. 02/13/22 02/13/23  Ghimire, Henreitta Leber, MD  traMADol (ULTRAM) 50 MG tablet Take 1 tablet (50 mg total) by mouth every 6 (six) hours as needed for moderate pain. 12/13/21   Samella Parr, NP  umeclidinium-vilanterol (ANORO ELLIPTA) 62.5-25 MCG/ACT AEPB Inhale 1 puff into the lungs daily. 03/16/22   Ghimire, Henreitta Leber, MD  Vitamin D3 (VITAMIN D) 25 MCG tablet Take 1 tablet (1,000 Units total) by  mouth daily. 12/13/21   Samella Parr, NP  ziprasidone (GEODON) 40 MG capsule Take 1 capsule (40 mg total) by mouth 2 (two) times daily with a meal. 11/06/11 01/09/12  Daleen Bo, MD      Allergies    Patient has no active allergies.    Review of Systems   Review of Systems  Unable to perform ROS: Acuity of condition   Physical Exam Updated Vital Signs BP 122/73   Pulse (!) 54   Temp (!) 96.4 F (35.8 C) (Temporal)   Resp 19   Ht '5\' 2"'  (1.575 m)   Wt 42.2 kg   SpO2 98%   BMI 17.01 kg/m  Physical Exam Vitals and nursing note reviewed. Exam conducted with a chaperone present.  Constitutional:      General: She is not in acute distress.    Appearance: She is normal weight. She is not ill-appearing.     Comments: Frail  HENT:     Head: Normocephalic.     Comments: Large left frontal hematoma    Right Ear: External ear normal.     Left Ear: External ear normal.     Nose: Nose normal.     Mouth/Throat:     Mouth: Mucous membranes are moist.     Comments: Very poor dentition Eyes:     General: No scleral icterus.    Extraocular Movements: Extraocular movements intact.     Pupils: Pupils are equal, round, and reactive to light.     Comments: Pupils constricted bilateral  Cardiovascular:     Rate and Rhythm: Normal rate and regular rhythm.      Pulses: Normal pulses.     Heart sounds: Normal heart sounds. No murmur heard. Pulmonary:     Effort: Bradypnea and prolonged expiration present.     Breath sounds: No stridor. No wheezing.     Comments: Diminished breath sounds bilateral Abdominal:     General: Abdomen is flat.     Palpations: Abdomen is soft.     Tenderness: There is no abdominal tenderness. There is no guarding.     Comments: Left lower quadrant ostomy  Musculoskeletal:     Cervical back: Normal range of motion. No rigidity.     Right lower leg: No edema.     Left lower leg: No edema.     Comments: No midline spinous process tenderness to palpation or percussion, no crepitus or step-off.    Pelvis stable to direct AP pressure.  No significant pain to lower extremities with logroll  Large sacral decubitus wound  Skin:    General: Skin is warm and dry.     Capillary Refill: Capillary refill takes less than 2 seconds.     Coloration: Skin is not jaundiced.     Findings: Bruising present.  Neurological:     Mental Status: She is lethargic.     GCS: GCS eye subscore is 2. GCS verbal subscore is 4. GCS motor subscore is 5.     Deep Tendon Reflexes: Reflexes normal.     Comments: No clonus  Not compliant with neurologic testing    ED Results / Procedures / Treatments   Labs (all labs ordered are listed, but only abnormal results are displayed) Labs Reviewed  CBC WITH DIFFERENTIAL/PLATELET - Abnormal; Notable for the following components:      Result Value   Hemoglobin 11.7 (*)    RDW 18.9 (*)    Lymphs Abs 0.6 (*)    All other  components within normal limits  COMPREHENSIVE METABOLIC PANEL - Abnormal; Notable for the following components:   Potassium 3.0 (*)    Chloride 113 (*)    CO2 18 (*)    BUN 26 (*)    Creatinine, Ser 1.74 (*)    Albumin 2.8 (*)    GFR, Estimated 34 (*)    All other components within normal limits  ACETAMINOPHEN LEVEL - Abnormal; Notable for the following components:    Acetaminophen (Tylenol), Serum <10 (*)    All other components within normal limits  I-STAT VENOUS BLOOD GAS, ED - Abnormal; Notable for the following components:   pCO2, Ven 36.8 (*)    Bicarbonate 17.2 (*)    TCO2 18 (*)    Acid-base deficit 9.0 (*)    Potassium 3.2 (*)    HCT 35.0 (*)    Hemoglobin 11.9 (*)    All other components within normal limits  TROPONIN I (HIGH SENSITIVITY) - Abnormal; Notable for the following components:   Troponin I (High Sensitivity) 26 (*)    All other components within normal limits  SALICYLATE LEVEL  ETHANOL  CK  AMMONIA  URINALYSIS, ROUTINE W REFLEX MICROSCOPIC  RAPID URINE DRUG SCREEN, HOSP PERFORMED  MAGNESIUM  LACTIC ACID, PLASMA  BLOOD GAS, ARTERIAL  CBG MONITORING, ED  TROPONIN I (HIGH SENSITIVITY)    EKG EKG Interpretation  Date/Time:  Tuesday April 08 2022 12:38:25 EDT Ventricular Rate:  56 PR Interval:  136 QRS Duration: 112 QT Interval:  481 QTC Calculation: 465 R Axis:   70 Text Interpretation: Sinus rhythm Probable left atrial enlargement Borderline intraventricular conduction delay Abnormal T, consider ischemia, diffuse leads similar to old tracing, rate reduced no stemi Confirmed by Wynona Dove ([id number]) on 04/08/2022 3:00:15 PM  Radiology DG Pelvis 1-2 Views  Result Date: 04/08/2022 CLINICAL DATA:  [place]. EXAM: PELVIS - 1-2 VIEW COMPARISON:  CT scan 03/06/2022 FINDINGS: Severe bilateral hip joint degenerative changes. No acute hip fracture. The bony pelvis is grossly intact. No pelvic fractures or bone lesions. The pubic symphysis and SI joints are intact. Stable advanced vascular calcifications. IMPRESSION: Severe bilateral hip joint degenerative changes but no acute bony findings. Electronically Signed   By: Marijo Sanes M.D.   On: 04/08/2022 13:00   CT Head Wo Contrast  Result Date: 04/08/2022 CLINICAL DATA:  Trauma, altered mental status EXAM: CT HEAD WITHOUT CONTRAST TECHNIQUE: Contiguous axial images were obtained from the  base of the skull through the vertex without intravenous contrast. RADIATION DOSE REDUCTION: This exam was performed according to the departmental dose-optimization program which includes automated exposure control, adjustment of the mA and/or kV according to patient size and/or use of iterative reconstruction technique. COMPARISON:  02/02/2022 FINDINGS: Brain: No acute intracranial findings are seen in noncontrast CT brain. There are no signs of bleeding within the cranium. Vascular: Unremarkable. Skull: No fracture is seen in the calvarium. There is subcutaneous hematoma in the left frontal scalp. Sinuses/Orbits: There is partial opacification of right maxillary sinus with no significant interval change. Other: None. IMPRESSION: No acute intracranial findings are seen in noncontrast CT brain. There is subcutaneous hematoma in the left frontal scalp. No fracture is seen in the calvarium. Right maxillary sinusitis. Electronically Signed   By: Elmer Picker M.D.   On: 04/08/2022 13:35   CT Cervical Spine Wo Contrast  Result Date: 04/08/2022 CLINICAL DATA:  Trauma EXAM: CT CERVICAL SPINE WITHOUT CONTRAST TECHNIQUE: Multidetector CT imaging of the cervical spine was performed without intravenous contrast. Multiplanar  CT image reconstructions were also generated. RADIATION DOSE REDUCTION: This exam was performed according to the departmental dose-optimization program which includes automated exposure control, adjustment of the mA and/or kV according to patient size and/or use of iterative reconstruction technique. COMPARISON:  03/05/2022 FINDINGS: Alignment: There is straightening of lordosis. Skull base and vertebrae: No recent fracture is seen. Degenerative changes are noted with bony spurs from C4 to C7 levels. Soft tissues and spinal canal: There is extrinsic pressure over the ventral margin of thecal sac caused by posterior bony spurs at C5-C6 level. Disc levels: There is encroachment of neural foramina  from C4-C7 levels. Upper chest: Unremarkable. Other: There are coarse arterial calcifications at carotid bifurcations. IMPRESSION: No recent fracture is seen in the cervical spine. Cervical spondylosis with encroachment of neural foramina at multiple levels. Electronically Signed   By: Elmer Picker M.D.   On: 04/08/2022 13:40   DG Chest Portable 1 View  Result Date: 04/08/2022 CLINICAL DATA:  Altered mental status, trauma EXAM: PORTABLE CHEST 1 VIEW COMPARISON:  03/09/2022 FINDINGS: Cardiac and mediastinal contours are within normal limits. No focal pulmonary opacity. No pleural effusion or pneumothorax. No acute osseous abnormality. IMPRESSION: No acute cardiopulmonary process. Electronically Signed   By: Merilyn Baba M.D.   On: 04/08/2022 13:00   CT Maxillofacial Wo Contrast  Result Date: 04/08/2022 CLINICAL DATA:  Trauma EXAM: CT MAXILLOFACIAL WITHOUT CONTRAST TECHNIQUE: Multidetector CT imaging of the maxillofacial structures was performed. Multiplanar CT image reconstructions were also generated. RADIATION DOSE REDUCTION: This exam was performed according to the departmental dose-optimization program which includes automated exposure control, adjustment of the mA and/or kV according to patient size and/or use of iterative reconstruction technique. COMPARISON:  02/02/2022 FINDINGS: Osseous: Minimal deformity in the nasal bones as not changed significantly. No definite recent fracture is seen. Orbits: Optic globes are symmetrical. Retrobulbar soft tissues are unremarkable. Sinuses: There is mucosal thickening and air-fluid level in the right maxillary sinus with no significant interval change. Soft tissues: Unremarkable. Limited intracranial: Unremarkable. IMPRESSION: No recent fracture is seen in the facial bones. Chronic right maxillary sinusitis. Electronically Signed   By: Elmer Picker M.D.   On: 04/08/2022 13:43    Procedures .Critical Care Performed by: Jeanell Sparrow,  DO Authorized by: Jeanell Sparrow, DO   Critical care provider statement:    Critical care time (minutes):  45   Critical care time was exclusive of:  Separately billable procedures and treating other patients   Critical care was necessary to treat or prevent imminent or life-threatening deterioration of the following conditions:  Toxidrome, CNS failure or compromise and trauma   Critical care was time spent personally by me on the following activities:  Development of treatment plan with patient or surrogate, discussions with consultants, evaluation of patient's response to treatment, examination of patient, ordering and review of laboratory studies, ordering and review of radiographic studies, ordering and performing treatments and interventions, pulse oximetry, re-evaluation of patient's condition, review of old charts and obtaining history from patient or surrogate   Care discussed with: admitting provider      Medications Ordered in ED Medications  Tdap (BOOSTRIX) injection 0.5 mL (has no administration in time range)  naloxone [facility]) injection 0.4 mg (has no administration in time range)  naloxone [facility]) 2 MG/2ML injection (has no administration in time range)  naloxone (NARCAN) 0.4 MG/ML injection (has no administration in time range)  naloxone [facility]) injection (2 mg Intravenous Given 04/08/22 1343)  naloxone HCl (NARCAN) 4 mg  in dextrose 5 % 250 mL infusion (2 mg/hr Intravenous New Bag/Given 04/08/22 1413)  naloxone (NARCAN) 2 MG/2ML injection (has no administration in time range)  potassium chloride 10 mEq in 100 mL IVPB (has no administration in time range)  docusate sodium (COLACE) capsule 100 mg (has no administration in time range)  polyethylene glycol (MIRALAX / GLYCOLAX) packet 17 g (has no administration in time range)  heparin injection 5,000 Units (has no administration in time range)  acetaminophen (TYLENOL) tablet 650 mg (has no administration in time range)  ondansetron  (ZOFRAN) injection 4 mg (has no administration in time range)  lactated ringers infusion (has no administration in time range)  ondansetron (ZOFRAN) injection 4 mg (4 mg Intravenous Given 04/08/22 1313)  sodium chloride 0.9 % bolus 1,000 mL (1,000 mLs Intravenous New Bag/Given 04/08/22 1339)    ED Course/ Medical Decision Making/ A&P                           Medical Decision Making Amount and/or Complexity of Data Reviewed Labs: ordered. Radiology: ordered. ECG/medicine tests: ordered.  Risk Prescription drug management. Decision regarding hospitalization.    CC: ams  This patient presents to the Emergency Department for the above complaint. This involves an extensive number of treatment options and is a complaint that carries with it a high risk of complications and morbidity. Vital signs were reviewed. Serious etiologies considered.  Differential diagnoses for altered mental status includes but is not exclusive to alcohol, illicit or prescription medications, intracranial pathology such as stroke, intracerebral hemorrhage, fever or infectious causes including sepsis, hypoxemia, uremia, trauma, endocrine related disorders such as diabetes, hypoglycemia, thyroid-related diseases, etc.  Primary survey- Airway intact, trachea midline.  Diminished breath sounds bilateral, spontaneous respirations.  Bradypnea.  Pulses equal in all 4 extremities, extremities warm well perfused.  GCS 11, intermittently combative, pupils constricted.  Large hematoma to left frontal forehead, skin tear to right forearm.  Patient did have a cigarette lighter and a crack pipe in her underwear.  Record review:  Previous records obtained and reviewed prior ED visits, prior admission, prior labs and imaging.   Additional history obtained from EMS  Medical and surgical history as noted above.   Work up as above, notable for:  Labs & imaging results that were available during my care of the patient were  visualized by me and considered in my medical decision making.   I ordered imaging studies which included CTH/CSPINE, CXR, Pelvis XR. I visualized the imaging, interpreted images, and I agree with radiologist interpretation. No acute abnormalities.  Will maintain c-collar despite negative CT C-spine imaging as patient is altered, unable to assess neurologic status fully.  Will recommend maintaining c-collar until neurologic status is normalized.  Cardiac monitoring reviewed and interpreted personally which shows Sinus brady.   Labs reviewed, she has mildly elevated trop w/o ecg changes, favor demand ischemia given respiratory distress. K is mildly depleted will replace IV. Mg pending. Labs otherwise at this time are stable.  Management: Pt did have improvement with narcan, after around 4m of narcan she did show response, requiring continued narcan and will start GTT. IV fluids, antiemetic  Reassessment:  Respiratory status improved with narcan gtt.   Admission was considered.   Patient requiring Narcan drip, will require admission to ICU for close monitoring of mental and respiratory status. Discussed with PCCM NP [place] (Dr ALynetta Mare who accepts pt for admission.  Social determinants of health include -  Social History   Socioeconomic History   Marital status: Unknown    Spouse name: Not on file   Number of children: Not on file   Years of education: Not on file   Highest education level: Not on file  Occupational History   Not on file  Tobacco Use   Smoking status: Every Day    Types: Cigarettes   Smokeless tobacco: Never  Vaping Use   Vaping Use: Never used  Substance and Sexual Activity   Alcohol use: Yes    Comment: uknown   Drug use: Yes    Types: Cocaine, "Crack" cocaine    Comment: 07/28/2017 "nothing in 3 wks"   Sexual activity: Not Currently    Birth control/protection: Abstinence  Other Topics Concern   Not on file  Social History  Narrative   ** Merged History Encounter **       ** Merged History Encounter **       ** Merged History Encounter **       Social Determinants of Radio broadcast assistant Strain: Not on file  Food Insecurity: Not on file  Transportation Needs: Not on file  Physical Activity: Not on file  Stress: Not on file  Social Connections: Not on file  Intimate Partner Violence: Not on file      This chart was dictated using Armed forces training and education officer.  Despite best efforts to proofread,  errors can occur which can change the documentation meaning.         Final Clinical Impression(s) / ED Diagnoses Final diagnoses:  Altered mental status, unspecified altered mental status type  Overdose of undetermined intent, initial encounter  Polysubstance abuse ([place])  Injury of head, initial encounter  Pressure injury of skin of sacral region, unspecified injury stage    Rx / DC Orders ED Discharge Orders     None         Jeanell Sparrow, DO 04/08/22 1550

## 2022-04-08 NOTE — Progress Notes (Signed)
Patient transported to CT scan and back to 3M09 without incidence. RN, RT and NT at bedside

## 2022-04-08 NOTE — Progress Notes (Signed)
Tracheal aspirate send to lab.

## 2022-04-08 NOTE — H&P (Signed)
NAME:  Sherry Baker, MRN:  [id number], DOB:  October 14, 1966, LOS: 0 ADMISSION DATE:  04/08/2022, CONSULTATION DATE:  04/08/2022 REFERRING MD:  Dr. Wynona Dove, CHIEF COMPLAINT:  AMS   History of Present Illness:  Sherry Baker is a 56 y/o female with a PMHx of polysubstance abuse, HFmrEF, HTN, IDA, CKD Stage 3b, colon cancer s/p colostomy, bipolar 1 disorder, who presents to the ED due to altered mental status.  Due to patient's persistent altered mental status, history obtained through chart review.  EMS was called to patient's home after a witnessed reports finding her writhing in pain on the floor.  She appeared to have hit her forehead and was bleeding.  At the time, she was responsive to painful stimuli.  No other history provided by witness.  Of note, patient was recently admitted from 03/05/2022 till 03/15/2022 when she left AMA.  She was admitted for altered mental status with subsequent respiratory failure requiring intubation and transferred to the ICU.  Blood cultures at the time grew Enterococcus faecalis that was treated with Augmentin.  Work-up for endocarditis was negative.  Encephalopathy was felt to be secondary to polysubstance withdrawal, specifically cocaine and alcohol.  Pertinent  Medical History   Past Medical History:  Diagnosis Date   Acquired hypothyroidism    Alcohol abuse    Allergy    PCNS swelling   Arthritis    Bipolar 1 disorder ([place])    Cancer ([place]) 01/21/2017   rectal cancer   Cancer ([place])    Chronic kidney disease    Chronic pain syndrome    Cirrhosis ([place])    Cirrhosis of liver (H[facility])    Cocaine abuse ([place])    COVID-19 virus infection 06/23/2021   Delirium tremens ([place]) 06/22/2021   Depression    Genetic testing 03/24/2017   Ms. Klang underwent genetic counseling and testing for hereditary cancer syndromes on 02/17/2017. Her results were negative for mutations in all 46 genes analyzed by [facility]'s 46-gene Common Hereditary Cancers Panel. Genes analyzed  include: APC, ATM, AXIN2, BARD1, BMPR1A, BRCA1, BRCA2, BRIP1, CDH1, CDKN2A, CHEK2, CTNNA1, DICER1, EPCAM, GREM1, HOXB13, KIT, MEN1, MLH1, MSH2, MSH3, MSH6, MUTYH, NBN,   HTN (hypertension)    Hypertension    Polysubstance abuse ([place])    Rectal cancer ([place])    Suicidal ideation 02/11/2014   Significant Hospital Events: Including procedures, antibiotic start and stop dates in addition to other pertinent events   6/6: Presented to the ED after being found on the floor at home, suspected opioid overdose.  Admitted to the ICU for naloxone infusion  Interim History / Subjective:  As above  Objective   Blood pressure 122/73, pulse (!) 54, temperature (!) 96.4 F (35.8 C), temperature source Temporal, resp. rate 19, height '5\' 2"'  (1.575 m), weight 42.2 kg, SpO2 98 %.       No intake or output data in the 24 hours ending 04/08/22 1507 Filed Weights   04/08/22 1300  Weight: 42.2 kg   Examination: General: Acute on chronically ill appearing female. Appears older than stated age. Disheveled.  HENT: Approximately 4 cm round hematoma on the left forehead. No lacerations noted. Dry mucus membranes. Pupils 3 mm and equal bilaterally. Sluggish but reactive to light.  Lungs: Expiratory wheezing noted bilaterally. Decreased respiratory rate with poor effort. No increased work of breathing.  Cardiovascular: Regular rhythm with bradycardia. No murmurs.  Abdomen: Soft, non-tender. Normoactive bowel sounds. Stoma in LLQ, pink.  Extremities: No pitting edema. Warm to touch.  Neuro:  Lethargic and minimally responsive to pain only.  Skin: Bruising present on left knee.  No petechiae or splinter hemorrhages.  Stage III sacral wound with approximate 3 cm opening with subcutaneous fat present, but no bone or tendon can be palpated.  Resolved Hospital Problem list   N/A  Assessment & Plan:   # Opioid Overdose  # Hypopnea # Sinus Bradycardia  # History of Polysubstance Abuse  Patient presented to AMS in  addition to hypothermia, bradycardia. Responsive to Naloxone, however persistent AMS and respiratory supression warrants Naloxone gtt. She is currently protecting her airway but high risk for intubation.  - Continue Naloxone gtt.  - Aspiration precautions - Monitor respiratory status as high risk for intubation - Continue ETCO monitoring - Continue supplemental oxygen PRN to maintain oxygen saturation > 90%. Wean as tolerated - TOC consult before discharge for cessation resources  - Hold home centrally acting medications, including Buspar, Lyrica, Atarax - UDS pending - ABG pending   # AKI on CKD Stage 3b # NAGMA # Hypokalemia  Suspected to be secondary to dehydration as the patient appears dry on examination.  We will rehydrate gently given history of heart failure.  - LR 100 cc/hr x 10 hours  - Repeat BMP in the AM - IV potassium replacement   # Emphysema # Possible ILD  - Restart home bronchodilators - Consider outpatient HRCT  # IDA Hemoglobin stable at this time.   - Monitor CBC while admitted - Transfuse for hemoglobin < 7 or hemodynamic compromise   # Hypertension - Holding home PO meds due to AMS, including Coreg, Amlodipine and Clonidine  - Hydralazine PRN for SBP > 180  # Sacral Ulcer, Stage 3 Unable to palpate bone on examination.   - Wound Care consult   # Cachexia Likely secondary to polysubstance abuse.   - Nutrition consult once patient is able to tolerate PO   # Colon Cancer s/p Colostomy Stoma appears pink and normally functioning.   # Pulmonary Nodules - Outpatient CT recommended in 3 months   Best Practice (right click and "Reselect all SmartList Selections" daily)   Diet/type: NPO DVT prophylaxis: prophylactic heparin  GI prophylaxis: N/A Lines: N/A Foley:  N/A Code Status:  full code Last date of multidisciplinary goals of care discussion [N/A; no family available at bedside]  Labs   CBC: Recent Labs  Lab 04/08/22 1236  04/08/22 1252  WBC 4.1  --   NEUTROABS 3.0  --   HGB 11.7* 11.9*  HCT 36.8 35.0*  MCV 89.1  --   PLT 248  --    Basic Metabolic Panel: Recent Labs  Lab 04/08/22 1236 04/08/22 1252  NA 143 143  K 3.0* 3.2*  CL 113*  --   CO2 18*  --   GLUCOSE 91  --   BUN 26*  --   CREATININE 1.74*  --   CALCIUM 9.3  --    GFR: Estimated Creatinine Clearance: 24.6 mL/min (A) (by C-G formula based on SCr of 1.74 mg/dL (H)). Recent Labs  Lab 04/08/22 1236  WBC 4.1   Liver Function Tests: Recent Labs  Lab 04/08/22 1236  AST 18  ALT 11  ALKPHOS 65  BILITOT 0.4  PROT 6.9  ALBUMIN 2.8*   No results for input(s): LIPASE, AMYLASE in the last 168 hours. Recent Labs  Lab 04/08/22 1236  AMMONIA 32   ABG    Component Value Date/Time   PHART 7.310 (L) 03/06/2022 1814   PCO2ART 42.7  03/06/2022 1814   PO2ART 213 (H) 03/06/2022 1814   HCO3 17.2 (L) 04/08/2022 1252   TCO2 18 (L) 04/08/2022 1252   ACIDBASEDEF 9.0 (H) 04/08/2022 1252   O2SAT 74 04/08/2022 1252    Coagulation Profile: No results for input(s): INR, PROTIME in the last 168 hours.  Cardiac Enzymes: Recent Labs  Lab 04/08/22 1236  CKTOTAL 68   HbA1C: Hgb A1c MFr Bld  Date/Time Value Ref Range Status  02/06/2022 06:34 AM 5.0 4.8 - 5.6 % Final    Comment:    (NOTE) Pre diabetes:          5.7%-6.4%  Diabetes:              >6.4%  Glycemic control for   <7.0% adults with diabetes   10/29/2021 07:27 AM 5.2 4.8 - 5.6 % Final    Comment:    (NOTE) Pre diabetes:          5.7%-6.4%  Diabetes:              >6.4%  Glycemic control for   <7.0% adults with diabetes    CBG: Recent Labs  Lab 04/08/22 1250  GLUCAP 97   Review of Systems:   Unable to obtain due to AMS  Past Medical History:  She,  has a past medical history of Acquired hypothyroidism, Alcohol abuse, Allergy, Arthritis, Bipolar 1 disorder ([place]), Cancer ([place]) (01/21/2017), Cancer ([place]), Chronic kidney disease, Chronic pain syndrome, Cirrhosis  ([place]), Cirrhosis of liver ([place]), Cocaine abuse ([place]), COVID-19 virus infection (06/23/2021), Delirium tremens ([place]) (06/22/2021), Depression, Genetic testing (03/24/2017), HTN (hypertension), Hypertension, Polysubstance abuse ([place]), Rectal cancer ([place]), and Suicidal ideation (02/11/2014).   Surgical History:   Past Surgical History:  Procedure Laterality Date   ABDOMINAL PERINEAL BOWEL RESECTION N/A 06/18/2017   Procedure: ABDOMINAL PERINEAL RESECTION ERAS PATHWAY;  Surgeon: Leighton Ruff, MD;  [facility];  Service: General;  Laterality: N/A;   BUBBLE STUDY  11/29/2021   Procedure: BUBBLE STUDY;  Surgeon: Berniece Salines, DO;  Location: [facility];  Service: Cardiovascular;;   COLON SURGERY     COLONOSCOPY WITH PROPOFOL Left 01/21/2017   Procedure: COLONOSCOPY WITH PROPOFOL;  Surgeon: Otis Brace, MD;  Location: [facility];  Service: Gastroenterology;  Laterality: Left;   Colostomy     FRACTURE SURGERY     MANDIBLE FRACTURE SURGERY     TEE WITHOUT CARDIOVERSION N/A 11/29/2021   Procedure: TRANSESOPHAGEAL ECHOCARDIOGRAM (TEE);  Surgeon: Berniece Salines, DO;  [facility];  Service: Cardiovascular;  Laterality: N/A;     Social History:   reports that she has been smoking cigarettes. She has never used smokeless tobacco. She reports current alcohol use. She reports current drug use. Drugs: Cocaine and "Crack" cocaine.   Family History:  Her family history includes Cancer (age of onset: 46) in her mother; Cancer - Other in her maternal aunt.   Allergies No Active Allergies   Home Medications  Prior to Admission medications   Medication Sig Start Date End Date Taking? Authorizing Provider  acetaminophen (TYLENOL) 325 MG tablet Take 2 tablets (650 mg total) by mouth every 12 (twelve) hours. 12/13/21   Samella Parr, NP  albuterol (VENTOLIN HFA) 108 (90 Base) MCG/ACT inhaler Inhale 2 puffs into the lungs every 6 (six) hours as needed for wheezing or shortness of breath.  02/13/22   Ghimire, Henreitta Leber, MD  albuterol (VENTOLIN HFA) 108 (90 Base) MCG/ACT inhaler Inhale 2 puffs into the lungs every 6 (six) hours as needed for  wheezing or shortness of breath. 03/15/22   Ghimire, Henreitta Leber, MD  amLODipine (NORVASC) 5 MG tablet Take 1 tablet (5 mg total) by mouth daily. 02/14/22   Ghimire, Henreitta Leber, MD  amLODipine (NORVASC) 5 MG tablet Take 1 tablet (5 mg total) by mouth daily. 03/15/22 03/15/23  Ghimire, Henreitta Leber, MD  budesonide-formoterol (SYMBICORT) 160-4.5 MCG/ACT inhaler Inhale 2 puffs into the lungs 2 (two) times daily. 02/13/22   Ghimire, Henreitta Leber, MD  busPIRone (BUSPAR) 15 MG tablet Take 1 tablet (15 mg total) by mouth 2 (two) times daily. 01/30/22   Argentina Donovan, PA-C  busPIRone (BUSPAR) 15 MG tablet Take 1 tablet (15 mg total) by mouth 2 (two) times daily. 02/13/22   Ghimire, Henreitta Leber, MD  carvedilol (COREG) 6.25 MG tablet Take 1 tablet (6.25 mg total) by mouth 2 (two) times daily with a meal. 01/30/22   McClung, Dionne Bucy, PA-C  citalopram (CELEXA) 10 MG tablet Take 1 tablet (10 mg total) by mouth daily. 01/30/22   Argentina Donovan, PA-C  cloNIDine (CATAPRES) 0.1 MG tablet Take 1 tablet (0.1 mg total) by mouth 2 (two) times daily. 02/13/22   Ghimire, Henreitta Leber, MD  diclofenac Sodium (VOLTAREN) 1 % GEL Apply 2 g topically 4 (four) times daily. 12/13/21   Samella Parr, NP  feeding supplement (ENSURE ENLIVE / ENSURE PLUS) LIQD Take 237 mLs by mouth 3 (three) times daily between meals. 12/13/21   Samella Parr, NP  folic acid (FOLVITE) 1 MG tablet Take 1 tablet (1 mg total) by mouth daily. 12/13/21   Samella Parr, NP  hydrOXYzine (ATARAX) 10 MG tablet Take 1 tablet (10 mg total) by mouth 3 (three) times daily as needed for anxiety. 12/13/21   Samella Parr, NP  Iron, Ferrous Sulfate, 325 (65 Fe) MG TABS Take 325 mg by mouth 2 (two) times daily. 01/31/22   Argentina Donovan, PA-C  lidocaine (LIDODERM) 5 % Place 2 patches onto the skin daily. Remove & Discard  patch within 12 hours or as directed by MD 12/14/21   Samella Parr, NP  methocarbamol (ROBAXIN) 500 MG tablet Take 1 tablet (500 mg total) by mouth 4 (four) times daily. 01/30/22   Argentina Donovan, PA-C  Multiple Vitamins-Minerals (CERTAVITE/ANTIOXIDANTS) TABS Take 1 tablet by mouth daily. 12/14/21   Samella Parr, NP  oxyCODONE (OXY IR/ROXICODONE) 5 MG immediate release tablet Take 1 tablet (5 mg total) by mouth every 4 (four) hours. 12/13/21   Samella Parr, NP  pantoprazole (PROTONIX) 40 MG tablet Take 1 tablet (40 mg total) by mouth daily. 12/14/21   Samella Parr, NP  pregabalin (LYRICA) 25 MG capsule Take 1 capsule (25 mg total) by mouth 3 (three) times daily. 01/30/22   Argentina Donovan, PA-C  thiamine 100 MG tablet Take 1 tablet (100 mg total) by mouth daily. 12/13/21   Samella Parr, NP  tiotropium (SPIRIVA HANDIHALER) 18 MCG inhalation capsule Place 1 capsule (18 mcg total) into inhaler and inhale daily. 02/13/22 02/13/23  Ghimire, Henreitta Leber, MD  traMADol (ULTRAM) 50 MG tablet Take 1 tablet (50 mg total) by mouth every 6 (six) hours as needed for moderate pain. 12/13/21   Samella Parr, NP  umeclidinium-vilanterol (ANORO ELLIPTA) 62.5-25 MCG/ACT AEPB Inhale 1 puff into the lungs daily. 03/16/22   Ghimire, Henreitta Leber, MD  Vitamin D3 (VITAMIN D) 25 MCG tablet Take 1 tablet (1,000 Units total) by mouth daily. 12/13/21   Erin Hearing  L, NP  ziprasidone (GEODON) 40 MG capsule Take 1 capsule (40 mg total) by mouth 2 (two) times daily with a meal. 11/06/11 01/09/12  Daleen Bo, MD   Dr. Jose Persia Internal Medicine PGY-3  04/08/2022, 3:07 PM

## 2022-04-08 NOTE — Procedures (Signed)
Intubation Procedure Note  Sherry Baker  [id number]  May 31, 1966  Date:04/08/22  Time:8:37 PM   Provider Performing:Sherry Baker Sherry Baker    Procedure: Intubation (70962)  Indication(s) Respiratory Failure  Consent Unable to obtain consent due to emergent nature of procedure.   Anesthesia Etomidate, Versed, Fentanyl, and see  mar   Time Out Verified patient identification, verified procedure, site/side was marked, verified correct patient position, special equipment/implants available, medications/allergies/relevant history reviewed, required imaging and test results available.   Sterile Technique Usual hand hygeine, masks, and gloves were used   Procedure Description Patient positioned in bed supine.  Sedation given as noted above.  Patient was intubated with endotracheal tube using Glidescope.  View was Grade 1 full glottis .  Number of attempts was 1.  Colorimetric CO2 detector was consistent with tracheal placement.   Complications/Tolerance None; patient tolerated the procedure well. Chest X-ray is ordered to verify placement.   EBL Minimal   Specimen(s) None   Sats remained >95% throughout.  Copious secretions will send for cx.

## 2022-04-08 NOTE — Progress Notes (Signed)
[facility] Progress Note Patient Name: Sherry Baker DOB: 07-24-1966 MRN: [id number]   Date of Service  04/08/2022  HPI/Events of Note  Patient with prolonged coma despite Narcan gtt.  eICU Interventions  Repeat CT scan of the brain ordered, EEG ordered r/o seizures.        Kerry Kass Dash Cardarelli 04/08/2022, 10:34 PM

## 2022-04-08 NOTE — ED Notes (Signed)
Trauma Response Nurse Documentation   Sherry Baker is a 56 y.o. female arriving to [facility] ED via EMS  On No antithrombotic. Trauma was activated as a Level 2 by [employer] based on the following trauma criteria GCS 10-14 associated with trauma or AVPU < A. Trauma team at the bedside on patient arrival. Patient cleared for CT by Dr. Pearline Cables EDP. Patient to CT with team. GCS 7.  History   Past Medical History:  Diagnosis Date   Acquired hypothyroidism    Alcohol abuse    Allergy    PCNS swelling   Arthritis    Bipolar 1 disorder ([place])    Cancer ([place]) 01/21/2017   rectal cancer   Cancer ([place])    Chronic kidney disease    Chronic pain syndrome    Cirrhosis ([place])    Cirrhosis of liver ([place])    Cocaine abuse ([place])    COVID-19 virus infection 06/23/2021   Delirium tremens ([place]) 06/22/2021   Depression    Genetic testing 03/24/2017   Ms. Brys underwent genetic counseling and testing for hereditary cancer syndromes on 02/17/2017. Her results were negative for mutations in all 46 genes analyzed by [facility]'s 46-gene Common Hereditary Cancers Panel. Genes analyzed include: APC, ATM, AXIN2, BARD1, BMPR1A, BRCA1, BRCA2, BRIP1, CDH1, CDKN2A, CHEK2, CTNNA1, DICER1, EPCAM, GREM1, HOXB13, KIT, MEN1, MLH1, MSH2, MSH3, MSH6, MUTYH, NBN,   HTN (hypertension)    Hypertension    Polysubstance abuse ([place])    Rectal cancer ([place])    Suicidal ideation 02/11/2014     Past Surgical History:  Procedure Laterality Date   ABDOMINAL PERINEAL BOWEL RESECTION N/A 06/18/2017   Procedure: ABDOMINAL PERINEAL RESECTION ERAS PATHWAY;  Surgeon: Leighton Ruff, MD;  [facility];  Service: General;  Laterality: N/A;   BUBBLE STUDY  11/29/2021   Procedure: BUBBLE STUDY;  Surgeon: Berniece Salines, DO;  Location: [facility];  Service: Cardiovascular;;   COLON SURGERY     COLONOSCOPY WITH PROPOFOL Left 01/21/2017   Procedure: COLONOSCOPY WITH PROPOFOL;  Surgeon: Otis Brace, MD;  Location: [facility];   Service: Gastroenterology;  Laterality: Left;   Colostomy     FRACTURE SURGERY     MANDIBLE FRACTURE SURGERY     TEE WITHOUT CARDIOVERSION N/A 11/29/2021   Procedure: TRANSESOPHAGEAL ECHOCARDIOGRAM (TEE);  Surgeon: Berniece Salines, DO;  [facility];  Service: Cardiovascular;  Laterality: N/A;       Initial Focused Assessment (If applicable, or please see trauma documentation):  See event summary CT's Completed:   CT Head, CT Maxillofacial, and CT C-Spine   Interventions:  See event summary Plan for disposition:  Admission to ICU      Event Summary: Patient brought in by [facility] as altered mental status. Per EMS witness in the house stated the patient was acting normally when they left. They came back and patient was writhing on floor and states patient hit head on the floor. Patient arrives to department with GCS 7, minimally responsive to painful stimuli. Patient taken to CT scan. Patient returned to treatment room, given 0.4 narcan x2 and 2 mg narcan x2. Patient more arousable via painful stimuli at this time. Patient placed on CO2 monitor at this time. Narcan drip initiated.  22G PIV right hand CT head CT maxillofacial CT s spine  Xray chest Xray pelvis 0.4 mg narcan x2 2 mg narcan x2 4 mg zofran Narcan drip administered   Bedside handoff with ED RN Carlis Abbott RN.    Trudee Kuster  Trauma Response RN  Please call TRN at [phone] for further assistance.

## 2022-04-08 NOTE — ED Triage Notes (Signed)
Patient BIB [facility] from a house for altered mental status. Witness in house told EMS she was behaving normally when he went outside then when he came back in patient was writhing on the floor when she hit her forehead on the floor and lost consciousness. Patient responds to painful stimuli only, has spontaneous respirations.

## 2022-04-09 ENCOUNTER — Inpatient Hospital Stay (HOSPITAL_COMMUNITY): Payer: Medicaid Other

## 2022-04-09 DIAGNOSIS — R4182 Altered mental status, unspecified: Secondary | ICD-10-CM | POA: Diagnosis not present

## 2022-04-09 LAB — BASIC METABOLIC PANEL
Anion gap: 12 (ref 5–15)
Anion gap: 6 (ref 5–15)
Anion gap: 5 (ref 5–15)
BUN: 16 mg/dL (ref 6–20)
BUN: 14 mg/dL (ref 6–20)
BUN: 12 mg/dL (ref 6–20)
CO2: 14 mmol/L — ABNORMAL LOW (ref 22–32)
CO2: 17 mmol/L — ABNORMAL LOW (ref 22–32)
CO2: 16 mmol/L — ABNORMAL LOW (ref 22–32)
Calcium: 8.8 mg/dL — ABNORMAL LOW (ref 8.9–10.3)
Calcium: 9.1 mg/dL (ref 8.9–10.3)
Calcium: 7.7 mg/dL — ABNORMAL LOW (ref 8.9–10.3)
Chloride: 113 mmol/L — ABNORMAL HIGH (ref 98–111)
Chloride: 121 mmol/L — ABNORMAL HIGH (ref 98–111)
Chloride: 124 mmol/L — ABNORMAL HIGH (ref 98–111)
Creatinine, Ser: 1.28 mg/dL — ABNORMAL HIGH (ref 0.44–1.00)
Creatinine, Ser: 1.3 mg/dL — ABNORMAL HIGH (ref 0.44–1.00)
Creatinine, Ser: 1.16 mg/dL — ABNORMAL HIGH (ref 0.44–1.00)
GFR, Estimated: 50 mL/min — ABNORMAL LOW (ref >60)
GFR, Estimated: 49 mL/min — ABNORMAL LOW (ref >60)
GFR, Estimated: 56 mL/min — ABNORMAL LOW (ref >60)
Glucose, Bld: 89 mg/dL (ref 70–99)
Glucose, Bld: 92 mg/dL (ref 70–99)
Glucose, Bld: 97 mg/dL (ref 70–99)
Potassium: 2.9 mmol/L — ABNORMAL LOW (ref 3.5–5.1)
Potassium: 4.5 mmol/L (ref 3.5–5.1)
Potassium: 3.8 mmol/L (ref 3.5–5.1)
Sodium: 139 mmol/L (ref 135–145)
Sodium: 144 mmol/L (ref 135–145)
Sodium: 145 mmol/L (ref 135–145)

## 2022-04-09 LAB — CBC
HCT: 38.1 % (ref 36.0–46.0)
Hemoglobin: 11.8 g/dL — ABNORMAL LOW (ref 12.0–15.0)
MCH: 27.7 pg (ref 26.0–34.0)
MCHC: 31 g/dL (ref 30.0–36.0)
MCV: 89.4 fL (ref 80.0–100.0)
Platelets: 268 10*3/uL (ref 150–400)
RBC: 4.26 MIL/uL (ref 3.87–5.11)
RDW: 19.3 % — ABNORMAL HIGH (ref 11.5–15.5)
WBC: 5.5 10*3/uL (ref 4.0–10.5)
nRBC: 0 % (ref 0.0–0.2)

## 2022-04-09 LAB — PHOSPHORUS
Phosphorus: 2.7 mg/dL (ref 2.5–4.6)
Phosphorus: 2.5 mg/dL (ref 2.5–4.6)

## 2022-04-09 LAB — GLUCOSE, CAPILLARY
Glucose-Capillary: 81 mg/dL (ref 70–99)
Glucose-Capillary: 80 mg/dL (ref 70–99)
Glucose-Capillary: 76 mg/dL (ref 70–99)
Glucose-Capillary: 75 mg/dL (ref 70–99)
Glucose-Capillary: 70 mg/dL (ref 70–99)
Glucose-Capillary: 104 mg/dL — ABNORMAL HIGH (ref 70–99)

## 2022-04-09 LAB — MAGNESIUM
Magnesium: 1.9 mg/dL (ref 1.7–2.4)
Magnesium: 2 mg/dL (ref 1.7–2.4)

## 2022-04-09 LAB — RAPID URINE DRUG SCREEN, HOSP PERFORMED
Amphetamines: NOT DETECTED
Barbiturates: NOT DETECTED
Benzodiazepines: POSITIVE — AB
Cocaine: POSITIVE — AB
Opiates: NOT DETECTED
Tetrahydrocannabinol: NOT DETECTED

## 2022-04-09 LAB — C-REACTIVE PROTEIN: CRP: 4.9 mg/dL — ABNORMAL HIGH (ref <1.0)

## 2022-04-09 LAB — VITAMIN D 25 HYDROXY (VIT D DEFICIENCY, FRACTURES): Vit D, 25-Hydroxy: 30.72 ng/mL (ref 30–100)

## 2022-04-09 LAB — TRIGLYCERIDES: Triglycerides: 173 mg/dL — ABNORMAL HIGH (ref <150)

## 2022-04-09 MED ORDER — MEDIHONEY WOUND/BURN DRESSING EX PSTE
1.0000 "application " | PASTE | Freq: Every day | CUTANEOUS | Status: DC
  Administered 2022-04-09 – 2022-04-17 (×8): 1 via TOPICAL
  Filled 2022-04-09 (×2): qty 44

## 2022-04-09 MED ORDER — ZINC SULFATE 220 (50 ZN) MG PO CAPS
220.0000 mg | ORAL_CAPSULE | Freq: Two times a day (BID) | ORAL | Status: DC
  Administered 2022-04-10 – 2022-04-14 (×9): 220 mg
  Filled 2022-04-09 (×9): qty 1

## 2022-04-09 MED ORDER — BUDESONIDE 0.5 MG/2ML IN SUSP
0.5000 mg | Freq: Two times a day (BID) | RESPIRATORY_TRACT | Status: DC
  Administered 2022-04-09 – 2022-04-12 (×8): 0.5 mg via RESPIRATORY_TRACT
  Filled 2022-04-09 (×8): qty 2

## 2022-04-09 MED ORDER — POTASSIUM CHLORIDE 20 MEQ PO PACK
40.0000 meq | PACK | Freq: Once | ORAL | Status: AC
  Administered 2022-04-09: 40 meq
  Filled 2022-04-09: qty 2

## 2022-04-09 MED ORDER — ASCORBIC ACID 500 MG PO TABS
250.0000 mg | ORAL_TABLET | Freq: Two times a day (BID) | ORAL | Status: DC
  Administered 2022-04-10 – 2022-04-14 (×9): 250 mg
  Filled 2022-04-09 (×9): qty 1

## 2022-04-09 MED ORDER — HYDRALAZINE HCL 20 MG/ML IJ SOLN
10.0000 mg | INTRAMUSCULAR | Status: AC | PRN
  Administered 2022-04-09: 20 mg via INTRAVENOUS
  Filled 2022-04-09: qty 1

## 2022-04-09 MED ORDER — POTASSIUM CHLORIDE 20 MEQ PO PACK
40.0000 meq | PACK | Freq: Once | ORAL | Status: AC
Start: 2022-04-09 — End: 2022-04-09
  Administered 2022-04-09: 40 meq
  Filled 2022-04-09: qty 2

## 2022-04-09 MED ORDER — OSMOLITE 1.2 CAL PO LIQD
1000.0000 mL | ORAL | Status: DC
  Administered 2022-04-09 – 2022-04-14 (×6): 1000 mL
  Filled 2022-04-09 (×9): qty 1000

## 2022-04-09 MED ORDER — POTASSIUM CHLORIDE 20 MEQ PO PACK
20.0000 meq | PACK | Freq: Once | ORAL | Status: AC
  Administered 2022-04-09: 20 meq
  Filled 2022-04-09: qty 1

## 2022-04-09 MED ORDER — HYDRALAZINE HCL 20 MG/ML IJ SOLN: 10.0000 mg | INTRAMUSCULAR | Status: DC | PRN

## 2022-04-09 MED ORDER — ADULT MULTIVITAMIN W/MINERALS CH
1.0000 | ORAL_TABLET | Freq: Every day | ORAL | Status: DC
  Administered 2022-04-10 – 2022-04-14 (×5): 1
  Filled 2022-04-09 (×6): qty 1

## 2022-04-09 MED ORDER — SODIUM BICARBONATE 650 MG PO TABS
650.0000 mg | ORAL_TABLET | Freq: Three times a day (TID) | ORAL | Status: DC
  Administered 2022-04-09 – 2022-04-10 (×4): 650 mg via ORAL
  Filled 2022-04-09 (×4): qty 1

## 2022-04-09 NOTE — Progress Notes (Signed)
Pts Cousin Training and development officer at [employer].   PW setup: "[employer] that MD call her when rounding [phone].  Crystal aking if there is anything case management can do to prevent admits and "break the cycle. "Specifically asking about IVC or having ruled as incompetent and long term rehab.   Crystal is frustrated by "breakdowns I the system" and by Patients inability and unwillingness to get help   Also requesting case management and chaplain assistance to finish [facility] POA  that started during previous admit

## 2022-04-09 NOTE — Assessment & Plan Note (Signed)
Minimally responsive and apneic following suspected narcotic overdose  - ICU for airway monitoring.  - Mental status should improve over next few hours given relatively short t 1/2 of most narcotics.

## 2022-04-09 NOTE — Progress Notes (Signed)
Initial Nutrition Assessment  DOCUMENTATION CODES:   Underweight, Severe malnutrition in context of chronic illness  INTERVENTION:   - Plan to exchange OG tube for Cortrak today  Initiate tube feeds via OG tube: - Start Osmolite 1.2 @ 20 ml/hr and advance by 10 ml q 6 hours to goal rate of 60 ml/hr (1440 ml/day)  Tube feeding regimen at goal rate provides 1728 kcal, 80 grams of protein, and 1181 ml of H2O.  Monitor magnesium, potassium, and phosphorus BID for at least 3 days, MD to replete as needed, as pt is at risk for refeeding syndrome given severe malnutrition, polysubstance abuse.  - MVI with minerals daily per tube  - Zinc sulfate 220 mg BID per tube and vitamin C 250 mg BID per tube to aid in wound healing  - Checking vitamin C, vitamin A, vitamin D, and zinc labs  NUTRITION DIAGNOSIS:   Severe Malnutrition related to social / environmental circumstances (polysubstance abuse) as evidenced by severe muscle depletion, severe fat depletion.  GOAL:   Patient will meet greater than or equal to 90% of their needs  MONITOR:   Vent status, Labs, Weight trends, TF tolerance, Skin  REASON FOR ASSESSMENT:   Ventilator, Consult Enteral/tube feeding initiation and management  ASSESSMENT:   56 year old female who presented to the ED on 6/06 with AMS. PMH of bipolar 1 disorder, EtOH abuse, polysubstance abuse, cirrhosis, chronic pain syndrome, HTN, CKD stage IIIb, colon cancer s/p colostomy. Pt required intubation. Pt admitted for opioid overdose, AKI.  Discussed pt with RN and during ICU rounds. RD is familiar with pt from multiple recent previous admissions.  RD consulted for enteral nutrition initiation and management. Pt with OG tube and side port in stomach per abdominal x-ray. Plan to exchange OG tube for Cortrak tube today.  Unable to obtain diet and weight history at this time. Reviewed weight history in chart. Pt with an overall weight loss of 4.4 kg over the last  year. This is an 8.9% weight loss which is not clinically significant for timeframe but is concerning given pt meets criteria for severe malnutrition based on NFPE alone.  RD to check vitamin A, vitamin C, and zinc labs given chronic, non-healing wounds. Will also check vitamin D lab as pt with a history of vitamin D deficiency. Will add MVI with minerals daily as well as zinc sulfate and vitamin C to aid in wound healing.  Admit weight: 45 kg  Patient is currently intubated on ventilator support MV: 7.8 L/min Temp (24hrs), Avg:97.9 F (36.6 C), Min:96.2 F (35.7 C), Max:100.7 F (38.2 C)  Drips: Propofol: 12.7 ml/hr (provides 335 kcal daily from lipid)  Medications reviewed and include: colace, folic acid, protonix, miralax, klor-con 40 mEq once, thiamine  Vitamin/Mineral Profile: Vitamin A: pending Vitamin D: pending Vitamin C: pending Zinc: pending CRP: pending  Labs reviewed: potassium 2.9, creatinine 1.28, TG 173 CBG's: 76-99 x 24 hours  I/O's: +1.8 L since admit  NUTRITION - FOCUSED PHYSICAL EXAM:  Flowsheet Row Most Recent Value  Orbital Region Severe depletion  Upper Arm Region Severe depletion  Thoracic and Lumbar Region Severe depletion  Buccal Region Severe depletion  Temple Region Severe depletion  Clavicle Bone Region Severe depletion  Clavicle and Acromion Bone Region Severe depletion  Scapular Bone Region Severe depletion  Dorsal Hand Severe depletion  Patellar Region Severe depletion  Anterior Thigh Region Severe depletion  Posterior Calf Region Severe depletion  Edema (RD Assessment) None  Hair Reviewed  Eyes Reviewed  Mouth Reviewed  Skin Reviewed  Nails Reviewed       Diet Order:   Diet Order             Diet NPO time specified  Diet effective now                   EDUCATION NEEDS:   Not appropriate for education at this time  Skin:  Skin Assessment: Skin Integrity Issues: Stage II: R buttock, L buttock Stage IV:  sacrum  Last BM:  04/09/22 colostomy  Height:   Ht Readings from Last 1 Encounters:  04/08/22 '5\' 2"'$  (1.575 m)    Weight:   Wt Readings from Last 1 Encounters:  04/09/22 45 kg    BMI:  Body mass index is 18.15 kg/m.  Estimated Nutritional Needs:   Kcal:  1550-1750  Protein:  75-90 grams  Fluid:  1.5-1.7 L    Gustavus Bryant, MS, RD, LDN Inpatient Clinical Dietitian Please see AMiON for contact information.

## 2022-04-09 NOTE — Progress Notes (Signed)
[facility] Progress Note Patient Name: Sherry Baker DOB: 12-16-1965 MRN: [id number]   Date of Service  04/09/2022  HPI/Events of Note  CT head negative, EEG pending, patient slowly becoming more responsive but likely will need to sleep off the narcotics overnight before meaningful wakefulness, BP remains elevated.  eICU Interventions  PRN Hydralazine order changed to 10-20 mg iv Q 2 hours PRN .        Kerry Kass Rawan Riendeau 04/09/2022, 12:27 AM

## 2022-04-09 NOTE — Consult Note (Signed)
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  Known to [facility] team.  ALso has LLQ colostomy Wound type: stage 4 pressure injury Pressure Injury POA: Yes Measurement: 6 cm x 3 cm 1 cm  Wound bed:70% slough 30% pale pink Drainage (amount, consistency, odor) minimal serosanguinous  musty odor. Periwound: intact Dressing procedure/placement/frequency: Cleanse sacral wound with NS. Pat dry then apply a nickel thick layer of MediHoney directly to the wound or onto a dressing. Cover with gauze and silicone foam dressing.  Make certain that the dressing covers the entire wound base, but not in contact with the peri-wound skin. Change dressing daily.  [name] Nurse ostomy follow up Stoma type/location: LLQ colostomy Stomal assessment/size: 1 1/2" pink patent and producing soft brown stool Peristomal assessment: not assessed pouch intact Treatment options for stomal/peristomal skin: 2 piece pouch system  Output soft brown stool Ostomy pouching: 2 pc.  Education provided: none Will not follow at this time.  Please re-consult if needed.  Domenic Moras MSN, RN, FNP-BC CWON Wound, Ostomy, Continence Nurse Pager [phone]

## 2022-04-09 NOTE — Procedures (Addendum)
History: 56 yo F s/p overdose with AMS  Sedation: fentanyl  Technique: This EEG was acquired with electrodes placed according to the International 10-20 electrode system (including Fp1, Fp2, F3, F4, C3, C4, P3, P4, O1, O2, T3, T4, T5, T6, A1, A2, Fz, Cz, Pz). The following electrodes were missing or displaced: none.   Background: The background consists of a burst suppression pattern with bursts consisting of 1-2 seconds of benign appearing intermixed delta and theta activity with an inter burst interval of 2 - 5 seconds.   Photic stimulation: Physiologic driving is none  EEG Abnormalities: 1) Burst-suppression pattern   Clinical Interpretation: This EEG recorded evidence of a profound cerebral dysfunction as can be seen in medication induced coma, post-anoxic injury, or less commonly in severe metabolic derangements.   In the absence of a clear period of cardiac arrest, would favor medication-induced which could be explained if the patient took other agents in addition to narcotics.   There was no seizure or seizure predisposition recorded on this study. Please note that lack of epileptiform activity on EEG does not preclude the possibility of epilepsy.   Roland Rack, MD [facility] [phone]  If 7pm- 7am, please page neurology on call as listed in [place].

## 2022-04-09 NOTE — Progress Notes (Signed)
Stat  EEG complete - results pending.  

## 2022-04-09 NOTE — Progress Notes (Signed)
NAME:  Sherry Baker, MRN:  [id number], DOB:  1965-12-20, LOS: 1 ADMISSION DATE:  04/08/2022, CONSULTATION DATE:  04/08/2022 REFERRING MD:  Dr. Wynona Dove, CHIEF COMPLAINT:  AMS   History of Present Illness:  Sherry Baker is a 56 y/o female with a PMHx of polysubstance abuse, HFmrEF, HTN, IDA, CKD Stage 3b, colon cancer s/p colostomy, bipolar 1 disorder, who presents to the ED due to altered mental status.  Due to patient's persistent altered mental status, history obtained through chart review.  EMS was called to patient's home after a witnessed reports finding her writhing in pain on the floor.  She appeared to have hit her forehead and was bleeding.  At the time, she was responsive to painful stimuli.  No other history provided by witness.  Of note, patient was recently admitted from 03/05/2022 till 03/15/2022 when she left AMA.  She was admitted for altered mental status with subsequent respiratory failure requiring intubation and transferred to the ICU.  Blood cultures at the time grew Enterococcus faecalis that was treated with Augmentin.  Work-up for endocarditis was negative.  Encephalopathy was felt to be secondary to polysubstance withdrawal, specifically cocaine and alcohol.  Pertinent  Medical History   Past Medical History:  Diagnosis Date   Acquired hypothyroidism    Alcohol abuse    Allergy    PCNS swelling   Arthritis    Bipolar 1 disorder ([place])    Cancer ([place]) 01/21/2017   rectal cancer   Cancer ([place])    Chronic kidney disease    Chronic pain syndrome    Cirrhosis ([place])    Cirrhosis of liver (HCC)    Cocaine abuse ([place])    COVID-19 virus infection 06/23/2021   Delirium tremens ([place]) 06/22/2021   Depression    Genetic testing 03/24/2017   Ms. Passarelli underwent genetic counseling and testing for hereditary cancer syndromes on 02/17/2017. Her results were negative for mutations in all 46 genes analyzed by [facility]'s 46-gene Common Hereditary Cancers Panel. Genes analyzed  include: APC, ATM, AXIN2, BARD1, BMPR1A, BRCA1, BRCA2, BRIP1, CDH1, CDKN2A, CHEK2, CTNNA1, DICER1, EPCAM, GREM1, HOXB13, KIT, MEN1, MLH1, MSH2, MSH3, MSH6, MUTYH, NBN,   HTN (hypertension)    Hypertension    Polysubstance abuse ([place])    Rectal cancer ([place])    Suicidal ideation 02/11/2014   Significant Hospital Events: Including procedures, antibiotic start and stop dates in addition to other pertinent events   6/6: Presented to the ED after being found on the floor at home, suspected opioid overdose.  Admitted to the ICU for naloxone infusion 6/6 overnight, required intubation due to AMS. CT head neg. Seizure like activity ? Withdrawal. EEG negative.  Interim History / Subjective:  Sedated on propofol. Vitals stable.  Objective   Blood pressure 136/68, pulse 79, temperature 97.8 F (36.6 C), temperature source Oral, resp. rate 20, height '5\' 2"'  (1.575 m), weight 45 kg, SpO2 100 %.    Vent Mode: PRVC FiO2 (%):  [30 %-60 %] 40 % Set Rate:  [20 bmp] 20 bmp Vt Set:  [400 mL] 400 mL PEEP:  [5 cmH20] 5 cmH20 Plateau Pressure:  [12 cmH20-17 cmH20] 12 cmH20   Intake/Output Summary (Last 24 hours) at 04/09/2022 0737 Last data filed at 04/09/2022 0300 Gross per 24 hour  Intake 2056.8 ml  Output 200 ml  Net 1856.8 ml   Filed Weights   04/08/22 1300 04/09/22 0415  Weight: 42.2 kg 45 kg   Examination: General: Adult female, chronically ill appearing, resting  in bed, in NAD. Neuro: Sedated, not responsive.Marland Kitchen HEENT: Old small laceration to left forehead. Otherwise [place]/AT. Sclerae anicteric. ETT in place. Cardiovascular: RRR, no M/R/G.  Lungs: Respirations even and unlabored.  CTA bilaterally, No W/R/R. Abdomen: Colostomy intact with minimal output. BS x 4, soft, NT/ND.  Musculoskeletal: No gross deformities, no edema.  Skin: Intact, warm, no rashes. Per report stage III sacral wound (not examined).   Assessment & Plan:   # Opioid Overdose  # History of Polysubstance Abuse  Patient  presented to AMS in addition to hypothermia, bradycardia. Responsive to Naloxone initially and was on narcan infusion which was subsequently stopped after intubation. - Continue supportive care. - TOC consult before discharge for cessation resources  - Hold home centrally acting medications, including Buspar, Lyrica, Atarax - UDS pending  # Acute hypoxic respiratory failure with inability to protect the airway - s/p intubation overnight 6/7. - Full vent support. - Wean as mental status allows. - Bronchial hygiene. - Follow CXR.  # AKI on CKD Stage 3b # NAGMA # Hypokalemia  - Gentle fluids - 40 mEq K per tube - Follow BMP - Repeat BMP in the AM  # Emphysema # Possible ILD  # Pulmonary Nodules - Continue home bronchodilators - Outpatient CT recommended in 3 months, consider HRCT  # IDA - Monitor CBC while admitted - Transfuse for hemoglobin < 7 or hemodynamic compromise   # Hypertension - Holding home PO meds due to AMS, including Coreg, Amlodipine and Clonidine  - Hydralazine PRN for SBP > 180  # Sacral Ulcer, Stage 3 - Wound Care consult   # Cachexia - Likely secondary to polysubstance abuse.  - Nutrition consult once patient is able to tolerate PO   # Colon Cancer s/p Colostomy Stoma appears pink and normally functioning.      Best Practice (right click and "Reselect all SmartList Selections" daily)   Diet/type: tubefeeds DVT prophylaxis: prophylactic heparin  GI prophylaxis: PPI Lines: N/A Foley:  N/A Code Status:  full code Last date of multidisciplinary goals of care discussion [N/A; no family available at bedside]  CC time: 35 min.   Montey Hora, [place] [facility] For pager details, please see AMION or use Epic chat  After 1900, please call [facility] for cross coverage needs 04/09/2022, 7:49 AM

## 2022-04-09 NOTE — TOC CAGE-AID Note (Signed)
Transition of Care [facility]) - CAGE-AID Screening   Patient Details  Name: SHARAN MCENANEY MRN: [id number] Date of Birth: 1965-11-26  [facility]) CM/SW Contact:    Shondrika Hoque C Tarpley-Carter, [place] Phone Number: 04/09/2022, 11:44 AM   Clinical Narrative: Pt is unable to participate in Cage Aid. Pt is currently absent from room.  Norlene Lanes Tarpley-Carter, MSW, LCSW-A Pronouns:  She/Her/Hers [facility] of Care Clinical Social Worker Direct Number:  [phone] [email or web address]  CAGE-AID Screening: Substance Abuse Screening unable to be completed due to: : Patient unable to participate

## 2022-04-09 NOTE — Progress Notes (Signed)
C-collar removed per order from Dr. Lucile Shutters.

## 2022-04-09 NOTE — Progress Notes (Signed)
  Transition of Care [facility]) Screening Note   Patient Details  Name: Sherry Baker Date of Birth: Jun 10, 1966   Transition of Care [facility]) CM/SW Contact:    Tom-Johnson, Renea Ee, RN Phone Number: 04/09/2022, 5:07 PM  Patient admitted for Opioid Overdose. Has hx of Poly substance abuse, Colon Cancer s/p Colostomy and Bipolar. Intubated.  Spiritual Care consult placed for Advanced Directives.   CM will continue to follow with needs as patient progresses.

## 2022-04-09 NOTE — Assessment & Plan Note (Signed)
Found unresponsive.  Partial response to narcan Started on Narcan infusion Minimally responsive to pain but breathing 16/min and saturation 100%  - Narcan infusion - titrate to maintain saturation and RR > 14

## 2022-04-09 NOTE — Progress Notes (Signed)
[facility] Progress Note Patient Name: Sherry Baker DOB: 1966-04-30 MRN: [id number]   Date of Service  04/09/2022  HPI/Events of Note  EEG interpretation noted. No seizures observed.  eICU Interventions  No intervention indicated at this time.        Kerry Kass [name] 04/09/2022, 4:17 AM

## 2022-04-09 NOTE — Procedures (Signed)
Cortrak  Person Inserting Tube:  Esaw Dace, RD Tube Type:  Cortrak - 43 inches Tube Size:  10 Tube Location:  Left nare Secured by: Bridle Technique Used to Measure Tube Placement:  Marking at nare/corner of mouth Cortrak Secured At:  71 cm Procedure Comments:  Cortrak Tube Team Note:  Consult received to place a Cortrak feeding tube.   X-ray is required, abdominal x-ray has been ordered by the Cortrak team. Please confirm tube placement before using the Cortrak tube.   If the tube becomes dislodged please keep the tube and contact the Cortrak team at [email or web address] ([email or web address]) for replacement.  If after hours and replacement cannot be delayed, place a NG tube and confirm placement with an abdominal x-ray.    Kerman Passey MS, RDN, LDN, CNSC Registered Dietitian III Clinical Nutrition RD Pager and On-Call Pager Number Located in [place]

## 2022-04-10 ENCOUNTER — Inpatient Hospital Stay (HOSPITAL_COMMUNITY): Payer: Medicaid Other

## 2022-04-10 DIAGNOSIS — T402X1A Poisoning by other opioids, accidental (unintentional), initial encounter: Secondary | ICD-10-CM | POA: Diagnosis not present

## 2022-04-10 DIAGNOSIS — J9601 Acute respiratory failure with hypoxia: Secondary | ICD-10-CM

## 2022-04-10 DIAGNOSIS — E872 Acidosis, unspecified: Secondary | ICD-10-CM | POA: Diagnosis present

## 2022-04-10 LAB — BASIC METABOLIC PANEL
Anion gap: 8 (ref 5–15)
BUN: 15 mg/dL (ref 6–20)
CO2: 15 mmol/L — ABNORMAL LOW (ref 22–32)
Calcium: 8.7 mg/dL — ABNORMAL LOW (ref 8.9–10.3)
Chloride: 119 mmol/L — ABNORMAL HIGH (ref 98–111)
Creatinine, Ser: 1.35 mg/dL — ABNORMAL HIGH (ref 0.44–1.00)
GFR, Estimated: 47 mL/min — ABNORMAL LOW (ref >60)
Glucose, Bld: 114 mg/dL — ABNORMAL HIGH (ref 70–99)
Potassium: 4.2 mmol/L (ref 3.5–5.1)
Sodium: 142 mmol/L (ref 135–145)

## 2022-04-10 LAB — CBC
HCT: 34.8 % — ABNORMAL LOW (ref 36.0–46.0)
Hemoglobin: 10.8 g/dL — ABNORMAL LOW (ref 12.0–15.0)
MCH: 28 pg (ref 26.0–34.0)
MCHC: 31 g/dL (ref 30.0–36.0)
MCV: 90.2 fL (ref 80.0–100.0)
Platelets: 232 10*3/uL (ref 150–400)
RBC: 3.86 MIL/uL — ABNORMAL LOW (ref 3.87–5.11)
RDW: 19.7 % — ABNORMAL HIGH (ref 11.5–15.5)
WBC: 6.3 10*3/uL (ref 4.0–10.5)
nRBC: 0 % (ref 0.0–0.2)

## 2022-04-10 LAB — PHOSPHORUS
Phosphorus: 1.3 mg/dL — ABNORMAL LOW (ref 2.5–4.6)
Phosphorus: 4.7 mg/dL — ABNORMAL HIGH (ref 2.5–4.6)

## 2022-04-10 LAB — GLUCOSE, CAPILLARY
Glucose-Capillary: 114 mg/dL — ABNORMAL HIGH (ref 70–99)
Glucose-Capillary: 119 mg/dL — ABNORMAL HIGH (ref 70–99)
Glucose-Capillary: 166 mg/dL — ABNORMAL HIGH (ref 70–99)
Glucose-Capillary: 192 mg/dL — ABNORMAL HIGH (ref 70–99)
Glucose-Capillary: 153 mg/dL — ABNORMAL HIGH (ref 70–99)
Glucose-Capillary: 175 mg/dL — ABNORMAL HIGH (ref 70–99)

## 2022-04-10 LAB — MAGNESIUM
Magnesium: 1.8 mg/dL (ref 1.7–2.4)
Magnesium: 2.7 mg/dL — ABNORMAL HIGH (ref 1.7–2.4)

## 2022-04-10 LAB — TROPONIN I (HIGH SENSITIVITY)
Troponin I (High Sensitivity): 18 ng/L — ABNORMAL HIGH (ref <18)
Troponin I (High Sensitivity): 20 ng/L — ABNORMAL HIGH (ref <18)

## 2022-04-10 MED ORDER — AMLODIPINE BESYLATE 5 MG PO TABS
5.0000 mg | ORAL_TABLET | Freq: Every day | ORAL | Status: DC
  Administered 2022-04-10: 5 mg via ORAL
  Filled 2022-04-10: qty 1

## 2022-04-10 MED ORDER — CARVEDILOL 6.25 MG PO TABS
6.2500 mg | ORAL_TABLET | Freq: Two times a day (BID) | ORAL | Status: DC
Start: 2022-04-11 — End: 2022-04-14
  Administered 2022-04-11 – 2022-04-14 (×7): 6.25 mg
  Filled 2022-04-10 (×7): qty 1

## 2022-04-10 MED ORDER — FENTANYL 2500MCG IN NS 250ML (10MCG/ML) PREMIX INFUSION
INTRAVENOUS | Status: AC
  Filled 2022-04-10: qty 250

## 2022-04-10 MED ORDER — FENTANYL 2500MCG IN NS 250ML (10MCG/ML) PREMIX INFUSION
0.0000 ug/h | INTRAVENOUS | Status: DC
  Administered 2022-04-10: 150 ug/h via INTRAVENOUS

## 2022-04-10 MED ORDER — SODIUM BICARBONATE 650 MG PO TABS
650.0000 mg | ORAL_TABLET | Freq: Three times a day (TID) | ORAL | Status: AC
Start: 2022-04-11 — End: 2022-04-11
  Administered 2022-04-11: 650 mg
  Filled 2022-04-10: qty 1

## 2022-04-10 MED ORDER — AMLODIPINE BESYLATE 5 MG PO TABS
5.0000 mg | ORAL_TABLET | Freq: Every day | ORAL | Status: DC
  Administered 2022-04-11 – 2022-04-12 (×2): 5 mg
  Filled 2022-04-10 (×2): qty 1

## 2022-04-10 MED ORDER — SODIUM BICARBONATE 8.4 % IV SOLN
100.0000 meq | Freq: Once | INTRAVENOUS | Status: AC
  Administered 2022-04-10: 100 meq via INTRAVENOUS
  Filled 2022-04-10: qty 100

## 2022-04-10 MED ORDER — FENTANYL 2500MCG IN NS 250ML (10MCG/ML) PREMIX INFUSION
0.0000 ug/h | INTRAVENOUS | Status: DC
  Administered 2022-04-10: 100 ug/h via INTRAVENOUS
  Filled 2022-04-10: qty 250

## 2022-04-10 MED ORDER — CARVEDILOL 6.25 MG PO TABS
6.2500 mg | ORAL_TABLET | Freq: Two times a day (BID) | ORAL | Status: DC
  Administered 2022-04-10: 6.25 mg via ORAL
  Filled 2022-04-10: qty 1

## 2022-04-10 MED ORDER — HYDRALAZINE HCL 20 MG/ML IJ SOLN
10.0000 mg | Freq: Four times a day (QID) | INTRAMUSCULAR | Status: DC | PRN
  Administered 2022-04-10: 10 mg via INTRAVENOUS
  Filled 2022-04-10: qty 1

## 2022-04-10 MED ORDER — ACETAMINOPHEN 325 MG PO TABS
650.0000 mg | ORAL_TABLET | ORAL | Status: DC | PRN
  Administered 2022-04-11 – 2022-04-13 (×3): 650 mg
  Filled 2022-04-10 (×3): qty 2

## 2022-04-10 MED ORDER — FUROSEMIDE 10 MG/ML IJ SOLN
40.0000 mg | Freq: Once | INTRAMUSCULAR | Status: AC
  Administered 2022-04-10: 40 mg via INTRAVENOUS
  Filled 2022-04-10: qty 4

## 2022-04-10 MED ORDER — POTASSIUM PHOSPHATES 15 MMOLE/5ML IV SOLN
30.0000 mmol | Freq: Once | INTRAVENOUS | Status: AC
  Administered 2022-04-10: 30 mmol via INTRAVENOUS
  Filled 2022-04-10: qty 10

## 2022-04-10 MED ORDER — MAGNESIUM SULFATE 2 GM/50ML IV SOLN
2.0000 g | Freq: Once | INTRAVENOUS | Status: AC
  Administered 2022-04-10: 2 g via INTRAVENOUS
  Filled 2022-04-10: qty 50

## 2022-04-10 MED ORDER — POLYETHYLENE GLYCOL 3350 17 G PO PACK
17.0000 g | PACK | Freq: Every day | ORAL | Status: DC | PRN
Start: 2022-04-10 — End: 2022-04-16

## 2022-04-10 MED ORDER — ASPIRIN 81 MG PO CHEW
81.0000 mg | CHEWABLE_TABLET | Freq: Every day | ORAL | Status: DC
  Administered 2022-04-10 – 2022-04-14 (×5): 81 mg
  Filled 2022-04-10 (×5): qty 1

## 2022-04-10 MED ORDER — FENTANYL 2500MCG IN NS 250ML (10MCG/ML) PREMIX INFUSION: 0.0000 ug/h | INTRAVENOUS | Status: DC

## 2022-04-10 NOTE — TOC CAGE-AID Note (Signed)
Transition of Care [facility]) - CAGE-AID Screening   Patient Details  Name: Sherry Baker MRN: [id number] Date of Birth: [birth year]  Transition of Care [facility]) CM/SW Contact:    Dosha Broshears C Tarpley-Carter, [place] Phone Number: 04/10/2022, 12:18 PM   Clinical Narrative: Pt is unable to participate in Cage Aid. Pt is critically ill.  Akayla Brass Tarpley-Carter, MSW, LCSW-A Pronouns:  She/Her/Hers [facility] Health[facility]itions of Care Clinical Social Worker Direct Number:  [phone] [email or web address]  CAGE-AID Screening: Substance Abuse Screening unable to be completed due to: : Patient unable to participate

## 2022-04-10 NOTE — Progress Notes (Addendum)
[place] Progress Note Patient Name: Sherry Baker DOB: [birth year] MRN: [id number]   Date of Service  04/10/2022  HPI/Events of Note  Multiple issues: 1. Nursing request for Fentanyl IV infusion. Nursing is pushing Fentanyl IV eQ 30 minutes. Propofol IV infusion at ceiling dose of 50 mcg/kg/min.  eICU Interventions  Plan: Fentanyl IV infusion. Titrate to RASS = 0 to -1.      Intervention Category Major Interventions: Delirium, psychosis, severe agitation - evaluation and management;Other:  Lysle Dingwall 04/10/2022, 2:56 AM

## 2022-04-10 NOTE — Progress Notes (Signed)
[place] Progress Note Patient Name: Sherry Baker DOB: [birth year] MRN: [id number]   Date of Service  04/10/2022  HPI/Events of Note  Patient has had a Fentanyl IV infusion running since 10:28 AM. No order for Fentanyl IV infusion.  eICU Interventions  Plan: Fentanyl IV infusion. Titrate to RASS = 0 to -1.      Intervention Category Major Interventions: Other:  Lysle Dingwall 04/10/2022, 9:24 PM

## 2022-04-10 NOTE — Progress Notes (Signed)
[place] Progress Note Patient Name: Sherry Baker DOB: Jun 22, 1966 MRN: [id number]   Date of Service  04/10/2022  HPI/Events of Note  Nursing concern for respiratory pattern (chest retraction) and requests CXR.   eICU Interventions  Will order portable CXR now.     Intervention Category Major Interventions: Respiratory failure - evaluation and management  Jyra Lagares [place] 04/10/2022, 5:43 AM

## 2022-04-10 NOTE — Progress Notes (Signed)
[place] Progress Note Patient Name: Sherry Baker DOB: July 15, 1966 MRN: [id number]   Date of Service  04/10/2022  HPI/Events of Note  Nursing concern for ST segment elevation on bedside monitor. Review of EKG reveals sinus tachycardia and ST & T wave abnormality, consider inferolateral ischemia. Cocaine on toxicology screen. Therefore, can't B-Block.  eICU Interventions  Plan: ASA 81 mg per tube now and Q day. Cycle Troponin.      Intervention Category Major Interventions: Other:  Sherry Baker 04/10/2022, 3:00 AM

## 2022-04-10 NOTE — Progress Notes (Signed)
   04/10/22 0935  Clinical Encounter Type  Visited With Patient not available  Visit Type Initial;Other (Comment) (Advanced Directive)  Referral From Nurse  Consult/Referral To Chaplain   Chaplain responded to a spiritual consult for advanced directive education. Patient was sleeping.  I will revisit tomorrow.  Danice Goltz [facility]  [phone]

## 2022-04-10 NOTE — Progress Notes (Signed)
NAME:  Sherry Baker, MRN:  [id number], DOB:  03/06/1966, LOS: 2 ADMISSION DATE:  04/08/2022, CONSULTATION DATE:  04/08/2022 REFERRING MD:  Dr. Wynona Dove, CHIEF COMPLAINT:  AMS   History of Present Illness:  Sherry Baker is a 56 y/o female with a PMHx of polysubstance abuse, HFmrEF, HTN, IDA, CKD Stage 3b, colon cancer s/p colostomy, bipolar 1 disorder, who presents to the ED due to altered mental status.  Due to patient's persistent altered mental status, history obtained through chart review.  EMS was called to patient's home after a witnessed reports finding her writhing in pain on the floor.  She appeared to have hit her forehead and was bleeding.  At the time, she was responsive to painful stimuli.  No other history provided by witness.  Of note, patient was recently admitted from 03/05/2022 till 03/15/2022 when she left AMA.  She was admitted for altered mental status with subsequent respiratory failure requiring intubation and transferred to the ICU.  Blood cultures at the time grew Enterococcus faecalis that was treated with Augmentin.  Work-up for endocarditis was negative.  Encephalopathy was felt to be secondary to polysubstance withdrawal, specifically cocaine and alcohol.  Pertinent  Medical History   Past Medical History:  Diagnosis Date   Acquired hypothyroidism    Alcohol abuse    Allergy    PCNS swelling   Arthritis    Bipolar 1 disorder ([place])    Cancer ([place]) 01/21/2017   rectal cancer   Cancer ([place])    Chronic kidney disease    Chronic pain syndrome    Cirrhosis ([place])    Cirrhosis of liver (HCC)    Cocaine abuse ([place])    COVID-19 virus infection 06/23/2021   Delirium tremens ([place]) 06/22/2021   Depression    Genetic testing 03/24/2017   Ms. Alverson underwent genetic counseling and testing for hereditary cancer syndromes on 02/17/2017. Her results were negative for mutations in all 46 genes analyzed by [facility]'s 46-gene Common Hereditary Cancers Panel. Genes analyzed  include: APC, ATM, AXIN2, BARD1, BMPR1A, BRCA1, BRCA2, BRIP1, CDH1, CDKN2A, CHEK2, CTNNA1, DICER1, EPCAM, GREM1, HOXB13, KIT, MEN1, MLH1, MSH2, MSH3, MSH6, MUTYH, NBN,   HTN (hypertension)    Hypertension    Polysubstance abuse ([place])    Rectal cancer ([place])    Suicidal ideation 02/11/2014   Significant Hospital Events: Including procedures, antibiotic start and stop dates in addition to other pertinent events   6/6: Presented to the ED after being found on the floor at home, suspected opioid overdose.  Admitted to the ICU for naloxone infusion 6/6 overnight, required intubation due to AMS. CT head neg. Seizure like activity ? Withdrawal. EEG negative.  Interim History / Subjective:  Sedated on propofol + Fentanyl. Vitals stable.  Objective   Blood pressure 104/67, pulse (!) 112, temperature 98.7 F (37.1 C), temperature source Oral, resp. rate 14, height '5\' 2"'  (1.575 m), weight 44.2 kg, SpO2 99 %.    Vent Mode: PSV;CPAP FiO2 (%):  [30 %-40 %] 30 % Set Rate:  [20 bmp] 20 bmp Vt Set:  [400 mL] 400 mL PEEP:  [5 cmH20] 5 cmH20 Pressure Support:  [10 cmH20] 10 cmH20 Plateau Pressure:  [14 cmH20-18 cmH20] 14 cmH20   Intake/Output Summary (Last 24 hours) at 04/10/2022 0805 Last data filed at 04/10/2022 0552 Gross per 24 hour  Intake 1133.93 ml  Output 475 ml  Net 658.93 ml    Filed Weights   04/09/22 0415 04/09/22 2330 04/10/22 0320  Weight: 45  kg 44.2 kg 44.2 kg   Examination: General: Adult female, chronically ill appearing, resting in bed, in NAD. Neuro: Sedated, not responsive.Marland Kitchen HEENT: Old small laceration to left forehead. Otherwise [place]/AT. Sclerae anicteric. ETT in place. Cardiovascular: RRR, no M/R/G.  Lungs: Respirations even and unlabored.  CTA bilaterally, No W/R/R. Abdomen: Colostomy intact with minimal output. BS x 4, soft, NT/ND.  Musculoskeletal: No gross deformities, no edema.  Skin: Intact, warm, no rashes. Per report stage III sacral wound (not  examined).   Assessment & Plan:   # Opioid Overdose  # History of Polysubstance Abuse  Patient presented to AMS in addition to hypothermia, bradycardia. Responsive to Naloxone initially and was on narcan infusion which was subsequently stopped after intubation. UDS positive for benzo's and cocaine. - Continue supportive care. - TOC consult before discharge for cessation resources  - Hold home centrally acting medications, including Buspar, Lyrica, Atarax  # Acute hypoxic respiratory failure with inability to protect the airway - s/p intubation overnight 6/7. - Full vent support. - Wean as mental status allows. Have asked RN to wean sedation this AM to facilitate WUA/SBT. Hopeful for extubation today (in favor of this over increasing sedation further). - Bronchial hygiene. - Follow CXR.  # AKI on CKD Stage 3b # NAGMA - Unclear etiology, ? RTA # Hypophosphatemia - 2 amps HCO3 - Kphos + lasix - Follow BMP  # Emphysema # Possible ILD  # Pulmonary Nodules - Continue home bronchodilators - Outpatient CT recommended in 3 months, consider HRCT  # IDA - Monitor CBC while admitted - Transfuse for hemoglobin < 7 or hemodynamic compromise   # Hypertension - Holding home PO meds due to AMS, including Coreg, Amlodipine and Clonidine  - Hydralazine PRN for SBP > 180  # Sacral Ulcer, Stage 3 - Wound Care    # Cachexia - Likely secondary to polysubstance abuse.  - TF's - Nutrition consult once patient is able to tolerate PO   # Colon Cancer s/p Colostomy Stoma appears pink and normally functioning - Supportive care     Best Practice (right click and "Reselect all SmartList Selections" daily)   Diet/type: tubefeeds DVT prophylaxis: prophylactic heparin  GI prophylaxis: PPI Lines: N/A Foley:  N/A Code Status:  full code Last date of multidisciplinary goals of care discussion [N/A; no family available at bedside]  CC time: 30 min.   Montey Hora, [place] [facility] For pager details, please see AMION or use Epic chat  After 1900, please call [place] for cross coverage needs 04/10/2022, 8:05 AM

## 2022-04-11 ENCOUNTER — Inpatient Hospital Stay (HOSPITAL_COMMUNITY): Payer: Medicaid Other

## 2022-04-11 DIAGNOSIS — R4182 Altered mental status, unspecified: Secondary | ICD-10-CM | POA: Diagnosis not present

## 2022-04-11 DIAGNOSIS — T402X1A Poisoning by other opioids, accidental (unintentional), initial encounter: Secondary | ICD-10-CM | POA: Diagnosis not present

## 2022-04-11 DIAGNOSIS — J9601 Acute respiratory failure with hypoxia: Secondary | ICD-10-CM | POA: Diagnosis not present

## 2022-04-11 DIAGNOSIS — F1910 Other psychoactive substance abuse, uncomplicated: Secondary | ICD-10-CM | POA: Diagnosis not present

## 2022-04-11 LAB — POCT I-STAT 7, (LYTES, BLD GAS, ICA,H+H)
Acid-base deficit: 2 mmol/L (ref 0.0–2.0)
Bicarbonate: 22.7 mmol/L (ref 20.0–28.0)
Calcium, Ion: 1.13 mmol/L — ABNORMAL LOW (ref 1.15–1.40)
HCT: 30 % — ABNORMAL LOW (ref 36.0–46.0)
Hemoglobin: 10.2 g/dL — ABNORMAL LOW (ref 12.0–15.0)
O2 Saturation: 97 %
Patient temperature: 99
Potassium: 4.5 mmol/L (ref 3.5–5.1)
Sodium: 141 mmol/L (ref 135–145)
TCO2: 24 mmol/L (ref 22–32)
pCO2 arterial: 38.6 mmHg (ref 32–48)
pH, Arterial: 7.378 (ref 7.35–7.45)
pO2, Arterial: 90 mmHg (ref 83–108)

## 2022-04-11 LAB — GLUCOSE, CAPILLARY
Glucose-Capillary: 137 mg/dL — ABNORMAL HIGH (ref 70–99)
Glucose-Capillary: 139 mg/dL — ABNORMAL HIGH (ref 70–99)
Glucose-Capillary: 142 mg/dL — ABNORMAL HIGH (ref 70–99)
Glucose-Capillary: 153 mg/dL — ABNORMAL HIGH (ref 70–99)
Glucose-Capillary: 120 mg/dL — ABNORMAL HIGH (ref 70–99)
Glucose-Capillary: 115 mg/dL — ABNORMAL HIGH (ref 70–99)

## 2022-04-11 LAB — MAGNESIUM
Magnesium: 2.7 mg/dL — ABNORMAL HIGH (ref 1.7–2.4)
Magnesium: 2.4 mg/dL (ref 1.7–2.4)

## 2022-04-11 LAB — BASIC METABOLIC PANEL
Anion gap: 11 (ref 5–15)
BUN: 29 mg/dL — ABNORMAL HIGH (ref 6–20)
CO2: 20 mmol/L — ABNORMAL LOW (ref 22–32)
Calcium: 8 mg/dL — ABNORMAL LOW (ref 8.9–10.3)
Chloride: 110 mmol/L (ref 98–111)
Creatinine, Ser: 2.03 mg/dL — ABNORMAL HIGH (ref 0.44–1.00)
GFR, Estimated: 29 mL/min — ABNORMAL LOW (ref >60)
Glucose, Bld: 142 mg/dL — ABNORMAL HIGH (ref 70–99)
Potassium: 5.6 mmol/L — ABNORMAL HIGH (ref 3.5–5.1)
Sodium: 141 mmol/L (ref 135–145)

## 2022-04-11 LAB — CULTURE, RESPIRATORY W GRAM STAIN: Gram Stain: NONE SEEN

## 2022-04-11 LAB — CBC
HCT: 36.6 % (ref 36.0–46.0)
Hemoglobin: 11.4 g/dL — ABNORMAL LOW (ref 12.0–15.0)
MCH: 27.7 pg (ref 26.0–34.0)
MCHC: 31.1 g/dL (ref 30.0–36.0)
MCV: 88.8 fL (ref 80.0–100.0)
Platelets: 224 10*3/uL (ref 150–400)
RBC: 4.12 MIL/uL (ref 3.87–5.11)
RDW: 19.8 % — ABNORMAL HIGH (ref 11.5–15.5)
WBC: 12.1 10*3/uL — ABNORMAL HIGH (ref 4.0–10.5)
nRBC: 0 % (ref 0.0–0.2)

## 2022-04-11 LAB — PHOSPHORUS
Phosphorus: 4.6 mg/dL (ref 2.5–4.6)
Phosphorus: 4.4 mg/dL (ref 2.5–4.6)

## 2022-04-11 LAB — ZINC: Zinc: 86 ug/dL (ref 44–115)

## 2022-04-11 LAB — VITAMIN A: Vitamin A (Retinoic Acid): 44.4 ug/dL (ref 20.1–62.0)

## 2022-04-11 MED ORDER — LINEZOLID 600 MG/300ML IV SOLN
600.0000 mg | Freq: Two times a day (BID) | INTRAVENOUS | Status: DC
Start: 2022-04-11 — End: 2022-04-15
  Administered 2022-04-11 – 2022-04-14 (×8): 600 mg via INTRAVENOUS
  Filled 2022-04-11 (×9): qty 300

## 2022-04-11 MED ORDER — LACTATED RINGERS IV SOLN: INTRAVENOUS | Status: AC

## 2022-04-11 MED ORDER — FUROSEMIDE 10 MG/ML IJ SOLN
40.0000 mg | Freq: Two times a day (BID) | INTRAMUSCULAR | Status: DC
  Administered 2022-04-11: 40 mg via INTRAVENOUS
  Filled 2022-04-11: qty 4

## 2022-04-11 MED ORDER — SODIUM ZIRCONIUM CYCLOSILICATE 10 G PO PACK
10.0000 g | PACK | Freq: Once | ORAL | Status: AC
  Administered 2022-04-11: 10 g
  Filled 2022-04-11: qty 1

## 2022-04-11 MED ORDER — DEXMEDETOMIDINE HCL IN NACL 400 MCG/100ML IV SOLN
0.4000 ug/kg/h | INTRAVENOUS | Status: DC
  Administered 2022-04-11 – 2022-04-12 (×2): 0.4 ug/kg/h via INTRAVENOUS
  Filled 2022-04-11 (×2): qty 100

## 2022-04-11 NOTE — Progress Notes (Signed)
NAME:  Sherry Baker, MRN:  [id number], DOB:  1966/05/12, LOS: 3 ADMISSION DATE:  04/08/2022, CONSULTATION DATE:  04/08/2022 REFERRING MD:  Dr. Wynona Dove, CHIEF COMPLAINT:  AMS   History of Present Illness:  Sherry Baker is a 56 y/o female with a PMHx of polysubstance abuse, HFmrEF, HTN, IDA, CKD Stage 3b, colon cancer s/p colostomy, bipolar 1 disorder, who presents to the ED due to altered mental status.  Due to patient's persistent altered mental status, history obtained through chart review.  EMS was called to patient's home after a witnessed reports finding her writhing in pain on the floor.  She appeared to have hit her forehead and was bleeding.  At the time, she was responsive to painful stimuli.  No other history provided by witness.  Of note, patient was recently admitted from 03/05/2022 till 03/15/2022 when she left AMA.  She was admitted for altered mental status with subsequent respiratory failure requiring intubation and transferred to the ICU.  Blood cultures at the time grew Enterococcus faecalis that was treated with Augmentin.  Work-up for endocarditis was negative.  Encephalopathy was felt to be secondary to polysubstance withdrawal, specifically cocaine and alcohol.  Pertinent  Medical History   Past Medical History:  Diagnosis Date   Acquired hypothyroidism    Alcohol abuse    Allergy    PCNS swelling   Arthritis    Bipolar 1 disorder ([place])    Cancer ([place]) 01/21/2017   rectal cancer   Cancer ([place])    Chronic kidney disease    Chronic pain syndrome    Cirrhosis ([place])    Cirrhosis of liver (HCC)    Cocaine abuse ([place])    COVID-19 virus infection 06/23/2021   Delirium tremens ([place]) 06/22/2021   Depression    Genetic testing 03/24/2017   Ms. Thorson underwent genetic counseling and testing for hereditary cancer syndromes on 02/17/2017. Her results were negative for mutations in all 46 genes analyzed by [facility]'s 46-gene Common Hereditary Cancers Panel. Genes analyzed  include: APC, ATM, AXIN2, BARD1, BMPR1A, BRCA1, BRCA2, BRIP1, CDH1, CDKN2A, CHEK2, CTNNA1, DICER1, EPCAM, GREM1, HOXB13, KIT, MEN1, MLH1, MSH2, MSH3, MSH6, MUTYH, NBN,   HTN (hypertension)    Hypertension    Polysubstance abuse ([place])    Rectal cancer ([place])    Suicidal ideation 02/11/2014   Significant Hospital Events: Including procedures, antibiotic start and stop dates in addition to other pertinent events   6/6: Presented to the ED after being found on the floor at home, suspected opioid overdose.  Admitted to the ICU for naloxone infusion 6/6 overnight, required intubation due to AMS. CT head neg. Seizure like activity ? Withdrawal. EEG negative. 6/8 agitation with sedation wean, HR up to 160s sustained. Wean stopped.  Interim History / Subjective:  Remains on propofol and fentanyl. Had agitation and tachycardia yesterday with wean. Will re-attempt today. Will also d/c fentanyl and hopefully propofol as well and switch to Precedex.  Objective   Blood pressure (!) 98/58, pulse (!) 103, temperature 98.8 F (37.1 C), temperature source Axillary, resp. rate (!) 22, height '5\' 2"'  (1.575 m), weight 46.8 kg, SpO2 95 %.    Vent Mode: PRVC FiO2 (%):  [30 %] 30 % Set Rate:  [20 bmp] 20 bmp Vt Set:  [400 mL] 400 mL PEEP:  [5 cmH20] 5 cmH20 Pressure Support:  [10 cmH20] 10 cmH20 Plateau Pressure:  [13 cmH20-16 cmH20] 14 cmH20   Intake/Output Summary (Last 24 hours) at 04/11/2022 1696 Last data filed at  04/11/2022 0738 Gross per 24 hour  Intake 1317.2 ml  Output 325 ml  Net 992.2 ml    Filed Weights   04/09/22 2330 04/10/22 0320 04/11/22 0500  Weight: 44.2 kg 44.2 kg 46.8 kg   Examination: General: Adult female, chronically ill appearing, resting in bed, in NAD. Neuro: Sedated, not responsive. HEENT: Old small laceration to left forehead. Otherwise [place]/AT. Sclerae anicteric. ETT in place. Cardiovascular: RRR, no M/R/G.  Lungs: Respirations even and unlabored.  CTA bilaterally, No  W/R/R. Abdomen: Colostomy intact with minimal output. BS x 4, soft, NT/ND.  Musculoskeletal: No gross deformities, no edema.  Skin: Intact, warm, no rashes. Per report stage III sacral wound (not examined).   Assessment & Plan:   # Opioid Overdose  # History of Polysubstance Abuse  Patient presented to AMS in addition to hypothermia, bradycardia. Responsive to Naloxone initially and was on narcan infusion which was subsequently stopped after intubation. UDS positive for benzo's and cocaine. - Continue supportive care. - TOC consult before discharge for cessation resources  - Hold home centrally acting medications, including Buspar, Lyrica, Atarax  # Acute hypoxic respiratory failure with inability to protect the airway - s/p intubation overnight 6/7. - Full vent support. - Wean as mental status allows. Have asked RN to wean sedation this AM to facilitate WUA/SBT. Switching sedation around, d/c fentanyl now. Start precedex and assess her response, hopefully d/c propofol too. - Bronchial hygiene. - Follow CXR.  # AKI on CKD Stage 3b - Oliguric with only 200cc urine documented overnight. +3.7L net since admit. #Hyperkalemia mild # NAGMA - Unclear etiology, ? RTA. Improved some - 47m Lasix x 2 doses today, assess response and redose accordingly. - 10g Lokelma x 1. - Follow BMP  # Emphysema # Possible ILD  # Pulmonary Nodules - Continue home bronchodilators - Outpatient CT recommended in 3 months, consider HRCT  # IDA - Monitor CBC while admitted - Transfuse for hemoglobin < 7 or hemodynamic compromise   # Hypertension - Continue home Coreg, Amlodipine. - Hold home Clonidine. - Hydralazine PRN for SBP > 180.  # Sacral Ulcer, Stage 3 - Wound Care    # Cachexia - Likely secondary to polysubstance abuse.  - TF's - Nutrition consult once patient is able to tolerate PO   # Colon Cancer s/p Colostomy Stoma appears pink and normally functioning - Supportive care     Best  Practice (right click and "Reselect all SmartList Selections" daily)   Diet/type: tubefeeds DVT prophylaxis: prophylactic heparin  GI prophylaxis: PPI Lines: N/A Foley:  N/A Code Status:  full code Last date of multidisciplinary goals of care discussion [N/A; no family available at bedside]  CC time: 30 min.   RMontey Hora PSmileyPulmonary & Critical Care Medicine For pager details, please see AMION or use Epic chat  After 1900, please call EW[place]andfor cross coverage needs 04/11/2022, 8:24 AM

## 2022-04-11 NOTE — Progress Notes (Signed)
Pharmacy Antibiotic Note  Sherry Baker is a 56 y.o. female admitted on 04/08/2022 with pneumonia.  Pharmacy has been consulted for Linezolid dosing.   Monitoring for worsening AKI; Scr 2.03  Plan: Start Linezolid 600 mg IV q12hr  Height: '5\' 2"'$  (157.5 cm) Weight: 46.8 kg (103 lb 2.8 oz) IBW/kg (Calculated) : 50.1  Temp (24hrs), Avg:99.9 F (37.7 C), Min:98.4 F (36.9 C), Max:102 F (38.9 C)  Recent Labs  Lab 04/08/22 1236 04/08/22 1816 04/09/22 0545 04/09/22 1636 04/09/22 2145 04/10/22 0324 04/11/22 0118  WBC 4.1  --  5.5  --   --  6.3 12.1*  CREATININE 1.74*  --  1.28* 1.30* 1.16* 1.35* 2.03*  LATICACIDVEN  --  1.4  --   --   --   --   --     Estimated Creatinine Clearance: 23.4 mL/min (A) (by C-G formula based on SCr of 2.03 mg/dL (H)).    No Known Allergies  Antimicrobials this admission: Linezolid 6/9 >>   Microbiology results: 6/6 Sputum: MRSA  6/6 MRSA PCR: positive  Thank you for allowing pharmacy to be a part of this patient's care.  Alanda Slim, PharmD, [facility] Clinical Pharmacist Please see AMION for all Pharmacists' Contact Phone Numbers 04/11/2022, 10:03 AM

## 2022-04-12 DIAGNOSIS — T402X1A Poisoning by other opioids, accidental (unintentional), initial encounter: Secondary | ICD-10-CM | POA: Diagnosis not present

## 2022-04-12 DIAGNOSIS — F1910 Other psychoactive substance abuse, uncomplicated: Secondary | ICD-10-CM | POA: Diagnosis not present

## 2022-04-12 DIAGNOSIS — J9601 Acute respiratory failure with hypoxia: Secondary | ICD-10-CM | POA: Diagnosis not present

## 2022-04-12 DIAGNOSIS — E872 Acidosis, unspecified: Secondary | ICD-10-CM

## 2022-04-12 LAB — CBC
HCT: 27.2 % — ABNORMAL LOW (ref 36.0–46.0)
Hemoglobin: 8.6 g/dL — ABNORMAL LOW (ref 12.0–15.0)
MCH: 27.8 pg (ref 26.0–34.0)
MCHC: 31.6 g/dL (ref 30.0–36.0)
MCV: 88 fL (ref 80.0–100.0)
Platelets: 166 10*3/uL (ref 150–400)
RBC: 3.09 MIL/uL — ABNORMAL LOW (ref 3.87–5.11)
RDW: 19 % — ABNORMAL HIGH (ref 11.5–15.5)
WBC: 8.1 10*3/uL (ref 4.0–10.5)
nRBC: 0 % (ref 0.0–0.2)

## 2022-04-12 LAB — BASIC METABOLIC PANEL
Anion gap: 11 (ref 5–15)
BUN: 42 mg/dL — ABNORMAL HIGH (ref 6–20)
CO2: 24 mmol/L (ref 22–32)
Calcium: 7.5 mg/dL — ABNORMAL LOW (ref 8.9–10.3)
Chloride: 105 mmol/L (ref 98–111)
Creatinine, Ser: 2.22 mg/dL — ABNORMAL HIGH (ref 0.44–1.00)
GFR, Estimated: 26 mL/min — ABNORMAL LOW (ref >60)
Glucose, Bld: 126 mg/dL — ABNORMAL HIGH (ref 70–99)
Potassium: 3.8 mmol/L (ref 3.5–5.1)
Sodium: 140 mmol/L (ref 135–145)

## 2022-04-12 LAB — TRIGLYCERIDES: Triglycerides: 83 mg/dL (ref <150)

## 2022-04-12 LAB — GLUCOSE, CAPILLARY
Glucose-Capillary: 132 mg/dL — ABNORMAL HIGH (ref 70–99)
Glucose-Capillary: 128 mg/dL — ABNORMAL HIGH (ref 70–99)
Glucose-Capillary: 110 mg/dL — ABNORMAL HIGH (ref 70–99)
Glucose-Capillary: 85 mg/dL (ref 70–99)
Glucose-Capillary: 101 mg/dL — ABNORMAL HIGH (ref 70–99)
Glucose-Capillary: 147 mg/dL — ABNORMAL HIGH (ref 70–99)

## 2022-04-12 LAB — VITAMIN C: Vitamin C: 0.4 mg/dL (ref 0.4–2.0)

## 2022-04-12 MED ORDER — SODIUM BICARBONATE 650 MG PO TABS
1300.0000 mg | ORAL_TABLET | Freq: Three times a day (TID) | ORAL | Status: DC
Start: 2022-04-12 — End: 2022-04-13
  Administered 2022-04-12 (×3): 1300 mg
  Filled 2022-04-12 (×3): qty 2

## 2022-04-12 MED ORDER — POTASSIUM CHLORIDE CRYS ER 20 MEQ PO TBCR
20.0000 meq | EXTENDED_RELEASE_TABLET | Freq: Once | ORAL | Status: AC
  Administered 2022-04-12: 20 meq via ORAL
  Filled 2022-04-12: qty 1

## 2022-04-12 MED ORDER — FUROSEMIDE 10 MG/ML IJ SOLN
40.0000 mg | Freq: Once | INTRAMUSCULAR | Status: AC
  Administered 2022-04-12: 40 mg via INTRAVENOUS
  Filled 2022-04-12: qty 4

## 2022-04-12 MED ORDER — ORAL CARE MOUTH RINSE
15.0000 mL | Freq: Two times a day (BID) | OROMUCOSAL | Status: DC
  Administered 2022-04-12 – 2022-04-17 (×7): 15 mL via OROMUCOSAL

## 2022-04-12 NOTE — Progress Notes (Signed)
NAME:  Sherry Baker, MRN:  [id number], DOB:  09-22-1966, LOS: 4 ADMISSION DATE:  04/08/2022, CONSULTATION DATE:  04/08/2022 REFERRING MD:  Dr. Wynona Dove, CHIEF COMPLAINT:  AMS   History of Present Illness:  Sherry Baker is a 56 y/o female with a PMHx of polysubstance abuse, HFmrEF, HTN, IDA, CKD Stage 3b, colon cancer s/p colostomy, bipolar 1 disorder, who presents to the ED due to altered mental status.  Due to patient's persistent altered mental status, history obtained through chart review.  EMS was called to patient's home after a witnessed reports finding her writhing in pain on the floor.  She appeared to have hit her forehead and was bleeding.  At the time, she was responsive to painful stimuli.  No other history provided by witness.  Of note, patient was recently admitted from 03/05/2022 till 03/15/2022 when she left AMA.  She was admitted for altered mental status with subsequent respiratory failure requiring intubation and transferred to the ICU.  Blood cultures at the time grew Enterococcus faecalis that was treated with Augmentin.  Work-up for endocarditis was negative.  Encephalopathy was felt to be secondary to polysubstance withdrawal, specifically cocaine and alcohol.  Pertinent  Medical History   Past Medical History:  Diagnosis Date   Acquired hypothyroidism    Alcohol abuse    Allergy    PCNS swelling   Arthritis    Bipolar 1 disorder ([place])    Cancer ([place]) 01/21/2017   rectal cancer   Cancer ([place])    Chronic kidney disease    Chronic pain syndrome    Cirrhosis ([place])    Cirrhosis of liver (HCC)    Cocaine abuse ([place])    COVID-19 virus infection 06/23/2021   Delirium tremens ([place]) 06/22/2021   Depression    Genetic testing 03/24/2017   Ms. Mezo underwent genetic counseling and testing for hereditary cancer syndromes on 02/17/2017. Her results were negative for mutations in all 46 genes analyzed by [facility]'s 46-gene Common Hereditary Cancers Panel. Genes analyzed  include: APC, ATM, AXIN2, BARD1, BMPR1A, BRCA1, BRCA2, BRIP1, CDH1, CDKN2A, CHEK2, CTNNA1, DICER1, EPCAM, GREM1, HOXB13, KIT, MEN1, MLH1, MSH2, MSH3, MSH6, MUTYH, NBN,   HTN (hypertension)    Hypertension    Polysubstance abuse ([place])    Rectal cancer ([place])    Suicidal ideation 02/11/2014   Significant Hospital Events: Including procedures, antibiotic start and stop dates in addition to other pertinent events   6/6: Presented to the ED after being found on the floor at home, suspected opioid overdose.  Admitted to the ICU for naloxone infusion 6/6 overnight, required intubation due to AMS. CT head neg. Seizure like activity ? Withdrawal. EEG negative. 6/8 agitation with sedation wean, HR up to 160s sustained. Wean stopped. 6/9 became completely unresponsive, ABG did not show CO2 retention, CT head was negative for acute findings  Interim History / Subjective:  Patient is tolerating spontaneous breathing trial today Her mental status is better today but she is not following commands.  Objective   Blood pressure 113/67, pulse (!) 105, temperature 99.9 F (37.7 C), temperature source Axillary, resp. rate 17, height _0  (1.575 m), weight 46.5 kg, SpO2 97 %.    Vent Mode: PSV;CPAP FiO2 (%):  [30 %] 30 % Set Rate:  [20 bmp] 20 bmp Vt Set:  [400 mL] 400 mL PEEP:  [5 cmH20] 5 cmH20 Pressure Support:  [5 cmH20] 5 cmH20 Plateau Pressure:  [11 cmH20-18 cmH20] 15 cmH20   Intake/Output Summary (Last 24 hours) at  04/12/2022 0819 Last data filed at 04/12/2022 0600 Gross per 24 hour  Intake 2380.31 ml  Output 2150 ml  Net 230.31 ml   Filed Weights   04/10/22 0320 04/11/22 0500 04/12/22 0500  Weight: 44.2 kg 46.8 kg 46.5 kg   Examination: General: Adult female, chronically ill appearing, resting in bed Neuro: Awake, not following commands, giving blank stares, positive brainstem reflexes, withdrawing in all 4 extremities HEENT: Old small laceration to left forehead. Otherwise [place]/AT. Sclerae  anicteric. ETT in place. Cardiovascular: RRR, no M/R/G.  Lungs: Respirations even and unlabored.  CTA bilaterally, No W/R/R. Abdomen: Colostomy intact with minimal output. BS x 4, soft, NT/ND.  Musculoskeletal: No gross deformities, no edema.  Skin: Intact, warm, no rashes. Per report stage III sacral wound    Assessment & Plan:   Opiate overdose Polysubstance abuse Patient presented to AMS in addition to hypothermia, bradycardia. Responsive to Naloxone initially and was on narcan infusion which was subsequently stopped after intubation. UDS positive for benzo's and cocaine Though UDS was negative for opiate, with response to naloxone high suspicion she overdose on opiate Continue supportive care. Watch for signs of withdrawal TOC consult before discharge for cessation resources  Hold home meds including Buspar, Lyrica, Atarax  Acute hypoxic respiratory failure  MRSA pneumonia Continue lung protective ventilation Patient is tolerating spontaneous breathing trial, her mental status is slightly improved from yesterday, will try to extubate her Avoid sedation with RASS goal 0 VAP bundle in place Patient respiratory culture came back positive for MRSA Started on linezolid She is afebrile now, white count is started trending down  AKI on CKD Stage 3b  Hyperkalemia, improved NAGMA Serum creatinine continue to trend up Lasix was held yesterday We will give her 40 mg once today Made 1.7 L of urine yesterday Hyperkalemia has improved Restarted on oral bicarbonate as serum bicarbonate remains low Monitor intake and output Avoid nephrotoxic agents- 32m Lasix x 2 doses today, assess   Hypocalcemia, improved  COPD, not in exacerbation  Pulmonary Nodules Continue home bronchodilators Outpatient CT recommended in 3 months, consider HRCT  Hypertension Continue home Coreg, Amlodipine. Hold home Clonidine. Hydralazine PRN for SBP > 180.  Sacral Ulcer, Stage 3, POA Continue Wound  Care    Acute toxic encephalopathy in the setting of drug abuse Patient mental status does not improve She is off sedation CT head was negative for acute findings  Severe protein calorie malnutrition Continue tube feeds and dietary supplements  Colon Cancer s/p Colostomy Stoma appears pink and normally functioning Supportive care  Best Practice (right click and "Reselect all SmartList Selections" daily)   Diet/type: tubefeeds DVT prophylaxis: prophylactic heparin  GI prophylaxis: PPI Lines: N/A Foley:  N/A Code Status:  full code Last date of multidisciplinary goals of care discussion [N/A; no family available at bedside]   Total critical care time: 35 minutes  Performed by: SJacky Kindle  Critical care time was exclusive of separately billable procedures and treating other patients.   Critical care was necessary to treat or prevent imminent or life-threatening deterioration.   Critical care was time spent personally by me on the following activities: development of treatment plan with patient and/or surrogate as well as nursing, discussions with consultants, evaluation of patient's response to treatment, examination of patient, obtaining history from patient or surrogate, ordering and performing treatments and interventions, ordering and review of laboratory studies, ordering and review of radiographic studies, pulse oximetry and re-evaluation of patient's condition.   SJacky Kindle MD [place]  Pulmonary Critical Care See Amion for pager If no response to pager, please call [phone] until 7pm After 7pm, Please call E-link [phone]

## 2022-04-12 NOTE — Progress Notes (Signed)
Pt transported on vent from 3M08 to 3M04 without any complications. RN at bedside, RT will monitor.

## 2022-04-12 NOTE — Procedures (Signed)
Extubation Procedure Note  Patient Details:   Name: Sherry Baker DOB: September 06, 1966 MRN: [id number]   Airway Documentation:    Vent end date: 04/12/22 Vent end time: 1144   Evaluation  O2 sats: stable throughout Complications: No apparent complications Patient did tolerate procedure well. Bilateral Breath Sounds: Diminished, Rhonchi   Yes  Pt extubated to 4L [place], pt tolerating well at this time. Cuff leak present, No stridor noted, MD aware, RN at bedside,RT will monitor.   Carren Rang 04/12/2022, 11:52 AM

## 2022-04-13 DIAGNOSIS — R4182 Altered mental status, unspecified: Secondary | ICD-10-CM | POA: Diagnosis not present

## 2022-04-13 DIAGNOSIS — T402X1A Poisoning by other opioids, accidental (unintentional), initial encounter: Secondary | ICD-10-CM | POA: Diagnosis not present

## 2022-04-13 DIAGNOSIS — J9601 Acute respiratory failure with hypoxia: Secondary | ICD-10-CM | POA: Diagnosis not present

## 2022-04-13 DIAGNOSIS — E872 Acidosis, unspecified: Secondary | ICD-10-CM | POA: Diagnosis not present

## 2022-04-13 LAB — GLUCOSE, CAPILLARY
Glucose-Capillary: 94 mg/dL (ref 70–99)
Glucose-Capillary: 131 mg/dL — ABNORMAL HIGH (ref 70–99)
Glucose-Capillary: 138 mg/dL — ABNORMAL HIGH (ref 70–99)
Glucose-Capillary: 103 mg/dL — ABNORMAL HIGH (ref 70–99)
Glucose-Capillary: 113 mg/dL — ABNORMAL HIGH (ref 70–99)

## 2022-04-13 LAB — CBC
HCT: 28.3 % — ABNORMAL LOW (ref 36.0–46.0)
Hemoglobin: 9 g/dL — ABNORMAL LOW (ref 12.0–15.0)
MCH: 27.7 pg (ref 26.0–34.0)
MCHC: 31.8 g/dL (ref 30.0–36.0)
MCV: 87.1 fL (ref 80.0–100.0)
Platelets: 182 10*3/uL (ref 150–400)
RBC: 3.25 MIL/uL — ABNORMAL LOW (ref 3.87–5.11)
RDW: 18.5 % — ABNORMAL HIGH (ref 11.5–15.5)
WBC: 7.3 10*3/uL (ref 4.0–10.5)
nRBC: 0 % (ref 0.0–0.2)

## 2022-04-13 LAB — BASIC METABOLIC PANEL
Anion gap: 11 (ref 5–15)
BUN: 39 mg/dL — ABNORMAL HIGH (ref 6–20)
CO2: 29 mmol/L (ref 22–32)
Calcium: 8 mg/dL — ABNORMAL LOW (ref 8.9–10.3)
Chloride: 98 mmol/L (ref 98–111)
Creatinine, Ser: 1.97 mg/dL — ABNORMAL HIGH (ref 0.44–1.00)
GFR, Estimated: 30 mL/min — ABNORMAL LOW (ref >60)
Glucose, Bld: 128 mg/dL — ABNORMAL HIGH (ref 70–99)
Potassium: 2.8 mmol/L — ABNORMAL LOW (ref 3.5–5.1)
Sodium: 138 mmol/L (ref 135–145)

## 2022-04-13 MED ORDER — CHLORHEXIDINE GLUCONATE CLOTH 2 % EX PADS
6.0000 | MEDICATED_PAD | Freq: Every day | CUTANEOUS | Status: DC
  Administered 2022-04-13 – 2022-04-17 (×5): 6 via TOPICAL

## 2022-04-13 MED ORDER — SODIUM CHLORIDE 0.9 % IV SOLN: INTRAVENOUS | Status: DC | PRN

## 2022-04-13 MED ORDER — POTASSIUM CHLORIDE 20 MEQ PO PACK
40.0000 meq | PACK | Freq: Once | ORAL | Status: AC
  Administered 2022-04-13: 40 meq
  Filled 2022-04-13: qty 2

## 2022-04-13 MED ORDER — POTASSIUM CHLORIDE CRYS ER 20 MEQ PO TBCR
40.0000 meq | EXTENDED_RELEASE_TABLET | Freq: Once | ORAL | Status: DC
Start: 2022-04-13 — End: 2022-04-13

## 2022-04-13 MED ORDER — POTASSIUM CHLORIDE 10 MEQ/100ML IV SOLN
10.0000 meq | INTRAVENOUS | Status: AC
  Administered 2022-04-13 (×4): 10 meq via INTRAVENOUS
  Filled 2022-04-13 (×4): qty 100

## 2022-04-13 MED ORDER — FUROSEMIDE 10 MG/ML IJ SOLN
40.0000 mg | Freq: Every day | INTRAMUSCULAR | Status: DC
  Administered 2022-04-13 – 2022-04-16 (×4): 40 mg via INTRAVENOUS
  Filled 2022-04-13 (×4): qty 4

## 2022-04-13 NOTE — Progress Notes (Incomplete)
Received patient from [phone]

## 2022-04-13 NOTE — Evaluation (Signed)
Physical Therapy Evaluation Patient Details Name: Sherry Baker MRN: [id number] DOB: Jan 12, 1966 Today's Date: 04/13/2022  History of Present Illness  56 year old female with polysubstance abuse, hypertension who is here with acute hypoxic respiratory failure and acute toxic encephalopathy in the setting of polysubstance abuse  Clinical Impression   Pt admitted with above diagnosis. Lives at home with a roommate, in a single-level apartment home with a few steps to enter; Per chart review, prior to admission, pt was able to walk with a RW, reports hers is broken now; Presents to PT with generalized weakness, decr activity tolerance, decr skin integrity effecting sitting tolerance; AMS, restelessness; Max assist of 2 to help sit up EOB and stand at bedside;  Pt currently with functional limitations due to the deficits listed below (see PT Problem List). Pt will benefit from skilled PT to increase their independence and safety with mobility to allow discharge to the venue listed below.          Recommendations for follow up therapy are one component of a multi-disciplinary discharge planning process, led by the attending physician.  Recommendations may be updated based on patient status, additional functional criteria and insurance authorization.  Follow Up Recommendations Skilled nursing-short term rehab (<3 hours/day)    Assistance Recommended at Discharge Frequent or constant Supervision/Assistance  Patient can return home with the following       Equipment Recommendations Rolling walker (2 wheels);BSC/3in1  Recommendations for Other Services  OT consult    Functional Status Assessment Patient has had a recent decline in their functional status and demonstrates the ability to make significant improvements in function in a reasonable and predictable amount of time.     Precautions / Restrictions Restrictions Weight Bearing Restrictions: No      Mobility  Bed Mobility Overal bed  mobility: Needs Assistance Bed Mobility: Rolling, Sidelying to Sit Rolling: Max assist Sidelying to sit: Max assist       General bed mobility comments: Second person helpful throughout for line management; Max assist to roll from L sidelying through supine and to R sidelying; Max assist to ease LEs' off of eOB and elevate trunk to sit; reporting rectal apin in sitting    Transfers Overall transfer level: Needs assistance Equipment used: 2 person hand held assist Transfers: Sit to/from Stand Sit to Stand: Mod assist, +2 physical assistance, +2 safety/equipment           General transfer comment: Discussed with pt that perhaps standing would get pressure off of her rectum and be more tolerable than sitting; Heavy mod assist to power up; once up, pt unable to take steps or march in place; sat back down to clean pad and assisted pt back to bed; ended session with pt in R sidelying    Ambulation/Gait                  Stairs            Wheelchair Mobility    Modified Rankin (Stroke Patients Only)       Balance                                             Pertinent Vitals/Pain Pain Assessment Pain Assessment: Faces Faces Pain Scale: Hurts even more Pain Location: Rectal pain Pain Descriptors / Indicators: Grimacing Pain Intervention(s): Monitored during session, Repositioned (Notified RN, who told this  PT that pt has a wound at her rectum)    Home Living Family/patient expects to be discharged to:: Private residence Living Arrangements: Non-relatives/Friends Available Help at Discharge: Friend(s);Available PRN/intermittently Type of Home: Apartment Home Access: Level entry       Home Layout: One level Home Equipment: Conservation officer, nature (2 wheels);Rollator (4 wheels) Additional Comments: Pt unable to provide information. Chart review for information.    Prior Function Prior Level of Function : Patient poor historian/Family not available              Mobility Comments: Per notes pt modified independent with walker or rollator ADLs Comments: Pt unable to provide information     Hand Dominance   Dominant Hand: Right    Extremity/Trunk Assessment   Upper Extremity Assessment Upper Extremity Assessment: Generalized weakness (Opted to keep mitts on during eval for pt safety)    Lower Extremity Assessment Lower Extremity Assessment: Generalized weakness (It seems her wound at rectum makes it painful for hip abduction)       Communication   Communication: Expressive difficulties (Mumbling, difficult to understand)  Cognition Arousal/Alertness: Awake/alert Behavior During Therapy: Restless Overall Cognitive Status: No family/caregiver present to determine baseline cognitive functioning Area of Impairment: Attention, Following commands, Safety/judgement, Awareness, Problem solving                       Following Commands: Follows one step commands inconsistently Safety/Judgement: Decreased awareness of safety, Decreased awareness of deficits Awareness: Intellectual Problem Solving: Requires verbal cues, Requires tactile cues General Comments: very distractible        General Comments General comments (skin integrity, edema, etc.): Sesison conducted on supplemental O2, O2 sats 90%; HR 72, BP 104/75    Exercises     Assessment/Plan    PT Assessment Patient needs continued PT services  PT Problem List Decreased strength;Decreased activity tolerance;Decreased balance;Decreased mobility;Decreased coordination;Decreased cognition;Decreased knowledge of use of DME;Decreased safety awareness;Decreased knowledge of precautions;Cardiopulmonary status limiting activity;Pain;Decreased skin integrity       PT Treatment Interventions DME instruction;Gait training;Stair training;Functional mobility training;Therapeutic activities;Therapeutic exercise;Balance training;Neuromuscular re-education;Cognitive  remediation;Patient/family education    PT Goals (Current goals can be found in the Care Plan section)  Acute Rehab PT Goals Patient Stated Goal: Did not state PT Goal Formulation: With patient Time For Goal Achievement: 04/27/22 Potential to Achieve Goals: Good    Frequency Min 2X/week     Co-evaluation               AM-PAC PT "6 Clicks" Mobility  Outcome Measure Help needed turning from your back to your side while in a flat bed without using bedrails?: A Lot Help needed moving from lying on your back to sitting on the side of a flat bed without using bedrails?: Total Help needed moving to and from a bed to a chair (including a wheelchair)?: Total Help needed standing up from a chair using your arms (e.g., wheelchair or bedside chair)?: Total Help needed to walk in hospital room?: Total Help needed climbing 3-5 steps with a railing? : Total 6 Click Score: 7    End of Session   Activity Tolerance: Patient limited by pain Patient left: in bed;with call bell/phone within reach;with bed alarm set Nurse Communication: Mobility status PT Visit Diagnosis: Other abnormalities of gait and mobility (R26.89);Muscle weakness (generalized) (M62.81)    Time: 9937-1696 PT Time Calculation (min) (ACUTE ONLY): 14 min   Charges:   PT Evaluation $PT Eval Moderate Complexity: 1 Mod  Roney Marion, PT  Acute Rehabilitation Services Office [phone]   [place] 04/13/2022, 2:40 PM

## 2022-04-13 NOTE — Progress Notes (Signed)
NAME:  Sherry Baker, MRN:  [id number], DOB:  December 30, 1965, LOS: 5 ADMISSION DATE:  04/08/2022, CONSULTATION DATE:  04/08/2022 REFERRING MD:  Dr. Wynona Dove, CHIEF COMPLAINT:  AMS   History of Present Illness:  Sherry Baker is a 56 y/o female with a PMHx of polysubstance abuse, HFmrEF, HTN, IDA, CKD Stage 3b, colon cancer s/p colostomy, bipolar 1 disorder, who presents to the ED due to altered mental status.  Due to patient's persistent altered mental status, history obtained through chart review.  EMS was called to patient's home after a witnessed reports finding her writhing in pain on the floor.  She appeared to have hit her forehead and was bleeding.  At the time, she was responsive to painful stimuli.  No other history provided by witness.  Of note, patient was recently admitted from 03/05/2022 till 03/15/2022 when she left AMA.  She was admitted for altered mental status with subsequent respiratory failure requiring intubation and transferred to the ICU.  Blood cultures at the time grew Enterococcus faecalis that was treated with Augmentin.  Work-up for endocarditis was negative.  Encephalopathy was felt to be secondary to polysubstance withdrawal, specifically cocaine and alcohol.  Pertinent  Medical History   Past Medical History:  Diagnosis Date   Acquired hypothyroidism    Alcohol abuse    Allergy    PCNS swelling   Arthritis    Bipolar 1 disorder ([place])    Cancer ([place]) 01/21/2017   rectal cancer   Cancer ([place])    Chronic kidney disease    Chronic pain syndrome    Cirrhosis ([place])    Cirrhosis of liver (HCC)    Cocaine abuse ([place])    COVID-19 virus infection 06/23/2021   Delirium tremens ([place]) 06/22/2021   Depression    Genetic testing 03/24/2017   Ms. Omura underwent genetic counseling and testing for hereditary cancer syndromes on 02/17/2017. Her results were negative for mutations in all 46 genes analyzed by [facility]'s 46-gene Common Hereditary Cancers Panel. Genes analyzed  include: APC, ATM, AXIN2, BARD1, BMPR1A, BRCA1, BRCA2, BRIP1, CDH1, CDKN2A, CHEK2, CTNNA1, DICER1, EPCAM, GREM1, HOXB13, KIT, MEN1, MLH1, MSH2, MSH3, MSH6, MUTYH, NBN,   HTN (hypertension)    Hypertension    Polysubstance abuse ([place])    Rectal cancer ([place])    Suicidal ideation 02/11/2014   Significant Hospital Events: Including procedures, antibiotic start and stop dates in addition to other pertinent events   6/6: Presented to the ED after being found on the floor at home, suspected opioid overdose.  Admitted to the ICU for naloxone infusion 6/6 overnight, required intubation due to AMS. CT head neg. Seizure like activity ? Withdrawal. EEG negative. 6/8 agitation with sedation wean, HR up to 160s sustained. Wean stopped. 6/9 became completely unresponsive, ABG did not show CO2 retention, CT head was negative for acute findings 6/10 extubated  Interim History / Subjective:  No overnight issues Patient remains confused  Objective   Blood pressure 102/66, pulse 79, temperature 97.9 F (36.6 C), temperature source Oral, resp. rate 18, height _0  (1.575 m), weight 46.5 kg, SpO2 96 %.    FiO2 (%):  [30 %] 30 %   Intake/Output Summary (Last 24 hours) at 04/13/2022 0851 Last data filed at 04/13/2022 0800 Gross per 24 hour  Intake 2927.94 ml  Output 650 ml  Net 2277.94 ml   Filed Weights   04/10/22 0320 04/11/22 0500 04/12/22 0500  Weight: 44.2 kg 46.8 kg 46.5 kg   Examination: General: Adult female,  chronically ill appearing, resting in bed Neuro: Alert, awake, intermittently following few commands HEENT: Old small laceration to left forehead. Otherwise [place]/AT. Sclerae anicteric.  Dry mucous membranes Cardiovascular: RRR, no M/R/G.  Lungs: Respirations even and unlabored.  CTA bilaterally, No W/R/R. Abdomen: Colostomy intact with minimal output. BS x 4, soft, NT/ND.  Musculoskeletal: No gross deformities, no edema.  Skin: Intact, warm, no rashes. Per report stage III sacral  wound    Assessment & Plan:   Opiate overdose Polysubstance abuse Patient presented to AMS in addition to hypothermia, bradycardia. Responsive to Naloxone initially and was on narcan infusion which was subsequently stopped after intubation. UDS positive for benzo's and cocaine Though UDS was negative for opiate, with response to naloxone high suspicion she overdose on opiate Continue supportive care. Watch for signs of withdrawal TOC consult before discharge for cessation resources  Hold home meds including Buspar, Lyrica, Atarax, can resume at discharge  Acute hypoxic respiratory failure  MRSA pneumonia Patient was successfully extubated yesterday She is on 2 L oxygen via nasal cannula, titrate with O2 sat goal 92% Stop sedation Respiratory culture grew MRSA Continue linezolid   AKI on CKD Stage 3b  Hyperkalemia, improved NAGMA, resolved Serum creatinine started trending down Continue Lasix 40 mg once daily Hyperkalemia has improved Monitor intake and output Avoid nephrotoxic agents  Hypocalcemia, improved  COPD, not in exacerbation  Pulmonary Nodules Continue home bronchodilators Outpatient CT recommended in 3 months, consider HRCT  Hypertension Continue home Coreg Hold amlodipine and clonidine  Sacral Ulcer, Stage 3, POA Continue Wound Care    Acute toxic encephalopathy in the setting of drug abuse Patient mental status is back to baseline CT head was negative for acute findings  Severe protein calorie malnutrition Continue tube feeds and dietary supplements  Colon Cancer s/p Colostomy Stoma appears pink and normally functioning Supportive care  Best Practice (right click and "Reselect all SmartList Selections" daily)   Diet/type: tubefeeds DVT prophylaxis: prophylactic heparin  GI prophylaxis: PPI Lines: N/A Foley:  N/A Code Status:  full code Last date of multidisciplinary goals of care discussion [N/A; no family available at bedside]    Jacky Kindle, MD [facility] See Amion for pager If no response to pager, please call [phone] until 7pm After 7pm, Please call E-link [phone]

## 2022-04-13 NOTE — Progress Notes (Signed)
[facility] ADULT ICU REPLACEMENT PROTOCOL   The patient does apply for the [facility] Adult ICU Electrolyte Replacment Protocol based on the criteria listed below:   1.Exclusion criteria: TCTS patients, ECMO patients, and Dialysis patients 2. Is GFR >/= 30 ml/min? Yes.    Patient's GFR today is 30 3. Is SCr </= 2? Yes.   Patient's SCr is 1.97 mg/dL 4. Did SCr increase >/= 0.5 in 24 hours? No. 5.Pt's weight >40kg  Yes.   6. Abnormal electrolyte(s): K  7. Electrolytes replaced per protocol 8.  Call MD STAT for K+ </= 2.5, Phos </= 1, or Mag </= 1 Physician:  Rosezella Rumpf [facility] 04/13/2022 3:27 AM

## 2022-04-13 NOTE — Progress Notes (Deleted)
New Patient Office Visit  Subjective    Patient ID: Sherry Baker, female    DOB: 16-Apr-1966  Age: 56 y.o. MRN: [id number]  CC: No chief complaint on file.   HPI Sherry Baker presents to establish care ***  Facility-Administered Encounter Medications as of 04/14/2022  Medication   acetaminophen (TYLENOL) tablet 650 mg   amLODipine (NORVASC) tablet 5 mg   ascorbic acid (VITAMIN C) tablet 250 mg   aspirin chewable tablet 81 mg   carvedilol (COREG) tablet 6.25 mg   Chlorhexidine Gluconate Cloth 2 % PADS 6 each   dexmedetomidine (PRECEDEX) 400 MCG/100ML (4 mcg/mL) infusion   docusate (COLACE) 50 MG/5ML liquid 100 mg   docusate sodium (COLACE) capsule 100 mg   feeding supplement (OSMOLITE 1.2 CAL) liquid 3,419 mL   folic acid injection 1 mg   heparin injection 5,000 Units   hydrALAZINE (APRESOLINE) injection 10 mg   ipratropium-albuterol (DUONEB) 0.5-2.5 (3) MG/3ML nebulizer solution 3 mL   ipratropium-albuterol (DUONEB) 0.5-2.5 (3) MG/3ML nebulizer solution 3 mL   leptospermum manuka honey (MEDIHONEY) paste 1 application.   linezolid (ZYVOX) IVPB 600 mg   MEDLINE mouth rinse   multivitamin with minerals tablet 1 tablet   ondansetron (ZOFRAN) injection 4 mg   pantoprazole sodium (PROTONIX) 40 mg/20 mL oral suspension 40 mg   polyethylene glycol (MIRALAX / GLYCOLAX) packet 17 g   polyethylene glycol (MIRALAX / GLYCOLAX) packet 17 g   sodium bicarbonate tablet 1,300 mg   thiamine (B-1) injection 100 mg   zinc sulfate capsule 220 mg   Outpatient Encounter Medications as of 04/14/2022  Medication Sig   acetaminophen (TYLENOL) 325 MG tablet Take 2 tablets (650 mg total) by mouth every 12 (twelve) hours. (Patient not taking: Reported on 04/09/2022)   albuterol (VENTOLIN HFA) 108 (90 Base) MCG/ACT inhaler Inhale 2 puffs into the lungs every 6 (six) hours as needed for wheezing or shortness of breath. (Patient not taking: Reported on 04/09/2022)   albuterol (VENTOLIN HFA) 108 (90 Base)  MCG/ACT inhaler Inhale 2 puffs into the lungs every 6 (six) hours as needed for wheezing or shortness of breath. (Patient not taking: Reported on 04/09/2022)   amLODipine (NORVASC) 5 MG tablet Take 1 tablet (5 mg total) by mouth daily. (Patient not taking: Reported on 04/09/2022)   budesonide-formoterol (SYMBICORT) 160-4.5 MCG/ACT inhaler Inhale 2 puffs into the lungs 2 (two) times daily. (Patient not taking: Reported on 04/09/2022)   busPIRone (BUSPAR) 15 MG tablet Take 1 tablet (15 mg total) by mouth 2 (two) times daily. (Patient not taking: Reported on 04/09/2022)   carvedilol (COREG) 6.25 MG tablet Take 1 tablet (6.25 mg total) by mouth 2 (two) times daily with a meal. (Patient not taking: Reported on 04/09/2022)   citalopram (CELEXA) 10 MG tablet Take 1 tablet (10 mg total) by mouth daily. (Patient not taking: Reported on 04/09/2022)   cloNIDine (CATAPRES) 0.1 MG tablet Take 1 tablet (0.1 mg total) by mouth 2 (two) times daily. (Patient not taking: Reported on 04/09/2022)   diclofenac Sodium (VOLTAREN) 1 % GEL Apply 2 g topically 4 (four) times daily. (Patient not taking: Reported on 04/09/2022)   feeding supplement (ENSURE ENLIVE / ENSURE PLUS) LIQD Take 237 mLs by mouth 3 (three) times daily between meals. (Patient not taking: Reported on 04/04/2296)   folic acid (FOLVITE) 1 MG tablet Take 1 tablet (1 mg total) by mouth daily. (Patient not taking: Reported on 04/09/2022)   hydrOXYzine (ATARAX) 10 MG tablet Take 1 tablet (  10 mg total) by mouth 3 (three) times daily as needed for anxiety. (Patient not taking: Reported on 04/09/2022)   Iron, Ferrous Sulfate, 325 (65 Fe) MG TABS Take 325 mg by mouth 2 (two) times daily. (Patient not taking: Reported on 04/09/2022)   lidocaine (LIDODERM) 5 % Place 2 patches onto the skin daily. Remove & Discard patch within 12 hours or as directed by MD (Patient not taking: Reported on 04/09/2022)   methocarbamol (ROBAXIN) 500 MG tablet Take 1 tablet (500 mg total) by mouth 4 (four) times  daily. (Patient not taking: Reported on 04/09/2022)   Multiple Vitamins-Minerals (CERTAVITE/ANTIOXIDANTS) TABS Take 1 tablet by mouth daily. (Patient not taking: Reported on 04/09/2022)   oxyCODONE (OXY IR/ROXICODONE) 5 MG immediate release tablet Take 1 tablet (5 mg total) by mouth every 4 (four) hours. (Patient not taking: Reported on 04/09/2022)   pantoprazole (PROTONIX) 40 MG tablet Take 1 tablet (40 mg total) by mouth daily. (Patient not taking: Reported on 04/09/2022)   pregabalin (LYRICA) 25 MG capsule Take 1 capsule (25 mg total) by mouth 3 (three) times daily. (Patient not taking: Reported on 04/09/2022)   thiamine 100 MG tablet Take 1 tablet (100 mg total) by mouth daily. (Patient not taking: Reported on 04/09/2022)   tiotropium (SPIRIVA HANDIHALER) 18 MCG inhalation capsule Place 1 capsule (18 mcg total) into inhaler and inhale daily. (Patient not taking: Reported on 04/09/2022)   traMADol (ULTRAM) 50 MG tablet Take 1 tablet (50 mg total) by mouth every 6 (six) hours as needed for moderate pain. (Patient not taking: Reported on 04/09/2022)   umeclidinium-vilanterol (ANORO ELLIPTA) 62.5-25 MCG/ACT AEPB Inhale 1 puff into the lungs daily. (Patient not taking: Reported on 04/09/2022)   Vitamin D3 (VITAMIN D) 25 MCG tablet Take 1 tablet (1,000 Units total) by mouth daily. (Patient not taking: Reported on 04/09/2022)   [DISCONTINUED] ziprasidone (GEODON) 40 MG capsule Take 1 capsule (40 mg total) by mouth 2 (two) times daily with a meal.    Past Medical History:  Diagnosis Date   Acquired hypothyroidism    Alcohol abuse    Allergy    PCNS swelling   Arthritis    Bipolar 1 disorder (HCC)    Cancer ([place]) 01/21/2017   rectal cancer   Cancer ([place])    Chronic kidney disease    Chronic pain syndrome    Cirrhosis ([place])    Cirrhosis of liver ([place])    Cocaine abuse ([place])    COVID-19 virus infection 06/23/2021   Delirium tremens ([place]) 06/22/2021   Depression    Genetic testing 03/24/2017   Sherry Baker underwent  genetic counseling and testing for hereditary cancer syndromes on 02/17/2017. Her results were negative for mutations in all 46 genes analyzed by [facility]'s 46-gene Common Hereditary Cancers Panel. Genes analyzed include: APC, ATM, AXIN2, BARD1, BMPR1A, BRCA1, BRCA2, BRIP1, CDH1, CDKN2A, CHEK2, CTNNA1, DICER1, EPCAM, GREM1, HOXB13, KIT, MEN1, MLH1, MSH2, MSH3, MSH6, MUTYH, NBN,   HTN (hypertension)    Hypertension    Polysubstance abuse ([place])    Rectal cancer ([place])    Suicidal ideation 02/11/2014    Past Surgical History:  Procedure Laterality Date   ABDOMINAL PERINEAL BOWEL RESECTION N/A 06/18/2017   Procedure: ABDOMINAL PERINEAL RESECTION ERAS PATHWAY;  Surgeon: Leighton Ruff, MD;  Location: [facility];  Service: General;  Laterality: N/A;   BUBBLE STUDY  11/29/2021   Procedure: BUBBLE STUDY;  Surgeon: Berniece Salines, DO;  Location: [facility];  Service: Cardiovascular;;   COLON SURGERY  COLONOSCOPY WITH PROPOFOL Left 01/21/2017   Procedure: COLONOSCOPY WITH PROPOFOL;  Surgeon: Otis Brace, MD;  Location: [facility];  Service: Gastroenterology;  Laterality: Left;   Colostomy     FRACTURE SURGERY     MANDIBLE FRACTURE SURGERY     TEE WITHOUT CARDIOVERSION N/A 11/29/2021   Procedure: TRANSESOPHAGEAL ECHOCARDIOGRAM (TEE);  Surgeon: Berniece Salines, DO;  [facility];  Service: Cardiovascular;  Laterality: N/A;    Family History  Problem Relation Age of Onset   Cancer Mother 50       Originating in the abdomen, otherwise unknwon   Cancer - Other Maternal Aunt        Throat    Social History   Socioeconomic History   Marital status: Unknown    Spouse name: Not on file   Number of children: Not on file   Years of education: Not on file   Highest education level: Not on file  Occupational History   Not on file  Tobacco Use   Smoking status: Every Day    Types: Cigarettes   Smokeless tobacco: Never  Vaping Use   Vaping Use: Never used  Substance and Sexual Activity    Alcohol use: Yes    Comment: uknown   Drug use: Yes    Types: Cocaine, "Crack" cocaine    Comment: 07/28/2017 "nothing in 3 wks"   Sexual activity: Not Currently    Birth control/protection: Abstinence  Other Topics Concern   Not on file  Social History Narrative   ** Merged History Encounter **       ** Merged History Encounter **       ** Merged History Encounter **       Social Determinants of Radio broadcast assistant Strain: Not on file  Food Insecurity: Not on file  Transportation Needs: Not on file  Physical Activity: Not on file  Stress: Not on file  Social Connections: Not on file  Intimate Partner Violence: Not on file    ROS      Objective    There were no vitals taken for this visit.  Physical Exam  {Labs (Optional):23779}    Assessment & Plan:   Problem List Items Addressed This Visit   None   No follow-ups on file.   Asencion Noble, MD

## 2022-04-14 ENCOUNTER — Ambulatory Visit: Payer: Medicaid Other | Admitting: Critical Care Medicine

## 2022-04-14 DIAGNOSIS — T402X1A Poisoning by other opioids, accidental (unintentional), initial encounter: Secondary | ICD-10-CM | POA: Diagnosis not present

## 2022-04-14 DIAGNOSIS — J9601 Acute respiratory failure with hypoxia: Secondary | ICD-10-CM | POA: Diagnosis not present

## 2022-04-14 LAB — BASIC METABOLIC PANEL
Anion gap: 11 (ref 5–15)
BUN: 36 mg/dL — ABNORMAL HIGH (ref 6–20)
CO2: 27 mmol/L (ref 22–32)
Calcium: 8.6 mg/dL — ABNORMAL LOW (ref 8.9–10.3)
Chloride: 100 mmol/L (ref 98–111)
Creatinine, Ser: 1.66 mg/dL — ABNORMAL HIGH (ref 0.44–1.00)
GFR, Estimated: 36 mL/min — ABNORMAL LOW (ref >60)
Glucose, Bld: 122 mg/dL — ABNORMAL HIGH (ref 70–99)
Potassium: 4.2 mmol/L (ref 3.5–5.1)
Sodium: 138 mmol/L (ref 135–145)

## 2022-04-14 LAB — GLUCOSE, CAPILLARY
Glucose-Capillary: 109 mg/dL — ABNORMAL HIGH (ref 70–99)
Glucose-Capillary: 112 mg/dL — ABNORMAL HIGH (ref 70–99)
Glucose-Capillary: 124 mg/dL — ABNORMAL HIGH (ref 70–99)
Glucose-Capillary: 126 mg/dL — ABNORMAL HIGH (ref 70–99)

## 2022-04-14 LAB — CULTURE, BLOOD (ROUTINE X 2)
Culture: NO GROWTH
Culture: NO GROWTH
Special Requests: ADEQUATE

## 2022-04-14 LAB — PHOSPHORUS: Phosphorus: 1.8 mg/dL — ABNORMAL LOW (ref 2.5–4.6)

## 2022-04-14 LAB — MAGNESIUM: Magnesium: 1.9 mg/dL (ref 1.7–2.4)

## 2022-04-14 MED ORDER — OSMOLITE 1.2 CAL PO LIQD
1000.0000 mL | ORAL | Status: DC
  Administered 2022-04-14: 1000 mL
  Filled 2022-04-14 (×2): qty 1000

## 2022-04-14 MED ORDER — POTASSIUM PHOSPHATES 15 MMOLE/5ML IV SOLN
20.0000 mmol | Freq: Once | INTRAVENOUS | Status: AC
  Administered 2022-04-14: 20 mmol via INTRAVENOUS
  Filled 2022-04-14: qty 6.67

## 2022-04-14 MED ORDER — CARVEDILOL 6.25 MG PO TABS
6.2500 mg | ORAL_TABLET | Freq: Two times a day (BID) | ORAL | Status: DC
  Administered 2022-04-14 – 2022-04-17 (×5): 6.25 mg via ORAL
  Filled 2022-04-14 (×6): qty 1

## 2022-04-14 MED ORDER — ASPIRIN 81 MG PO CHEW
81.0000 mg | CHEWABLE_TABLET | Freq: Every day | ORAL | Status: DC
  Administered 2022-04-15 – 2022-04-17 (×3): 81 mg via ORAL
  Filled 2022-04-14 (×3): qty 1

## 2022-04-14 MED ORDER — ENSURE ENLIVE PO LIQD
237.0000 mL | Freq: Three times a day (TID) | ORAL | Status: DC
  Administered 2022-04-14 – 2022-04-17 (×8): 237 mL via ORAL

## 2022-04-14 MED ORDER — ACETAMINOPHEN 325 MG PO TABS
650.0000 mg | ORAL_TABLET | ORAL | Status: DC | PRN
  Administered 2022-04-15: 650 mg via ORAL
  Filled 2022-04-14: qty 2

## 2022-04-14 MED ORDER — ZINC SULFATE 220 (50 ZN) MG PO CAPS
220.0000 mg | ORAL_CAPSULE | Freq: Two times a day (BID) | ORAL | Status: DC
  Administered 2022-04-15 – 2022-04-17 (×5): 220 mg via ORAL
  Filled 2022-04-14 (×7): qty 1

## 2022-04-14 MED ORDER — ASCORBIC ACID 500 MG PO TABS
250.0000 mg | ORAL_TABLET | Freq: Two times a day (BID) | ORAL | Status: DC
  Administered 2022-04-15 – 2022-04-17 (×5): 250 mg via ORAL
  Filled 2022-04-14 (×6): qty 1

## 2022-04-14 MED ORDER — ADULT MULTIVITAMIN W/MINERALS CH
1.0000 | ORAL_TABLET | Freq: Every day | ORAL | Status: DC
  Administered 2022-04-15 – 2022-04-17 (×3): 1 via ORAL
  Filled 2022-04-14 (×3): qty 1

## 2022-04-14 MED ORDER — POLYETHYLENE GLYCOL 3350 17 G PO PACK
17.0000 g | PACK | Freq: Every day | ORAL | Status: DC
  Administered 2022-04-15 – 2022-04-17 (×3): 17 g via ORAL
  Filled 2022-04-14 (×3): qty 1

## 2022-04-14 NOTE — Evaluation (Signed)
Clinical/Bedside Swallow Evaluation Patient Details  Name: Sherry Baker MRN: [id number] Date of Birth: Jun 13, 1966  Today's Date: 04/14/2022 Time: SLP Start Time (ACUTE ONLY): 0836 SLP Stop Time (ACUTE ONLY): 6440 SLP Time Calculation (min) (ACUTE ONLY): 14 min  Past Medical History:  Past Medical History:  Diagnosis Date   Acquired hypothyroidism    Alcohol abuse    Allergy    PCNS swelling   Arthritis    Bipolar 1 disorder ([place])    Cancer ([place]) 01/21/2017   rectal cancer   Cancer ([place])    Chronic kidney disease    Chronic pain syndrome    Cirrhosis ([place])    Cirrhosis of liver ([place])    Cocaine abuse ([place])    COVID-19 virus infection 06/23/2021   Delirium tremens ([place]) 06/22/2021   Depression    Genetic testing 03/24/2017   Ms. Robinson underwent genetic counseling and testing for hereditary cancer syndromes on 02/17/2017. Her results were negative for mutations in all 46 genes analyzed by [facility]'s 46-gene Common Hereditary Cancers Panel. Genes analyzed include: APC, ATM, AXIN2, BARD1, BMPR1A, BRCA1, BRCA2, BRIP1, CDH1, CDKN2A, CHEK2, CTNNA1, DICER1, EPCAM, GREM1, HOXB13, KIT, MEN1, MLH1, MSH2, MSH3, MSH6, MUTYH, NBN,   HTN (hypertension)    Hypertension    Polysubstance abuse ([place])    Rectal cancer ([place])    Suicidal ideation 02/11/2014   Past Surgical History:  Past Surgical History:  Procedure Laterality Date   ABDOMINAL PERINEAL BOWEL RESECTION N/A 06/18/2017   Procedure: ABDOMINAL PERINEAL RESECTION ERAS PATHWAY;  Surgeon: Leighton Ruff, MD;  [facility];  Service: General;  Laterality: N/A;   BUBBLE STUDY  11/29/2021   Procedure: BUBBLE STUDY;  Surgeon: Berniece Salines, DO;  Location: [place];  Service: Cardiovascular;;   COLON SURGERY     COLONOSCOPY WITH PROPOFOL Left 01/21/2017   Procedure: COLONOSCOPY WITH PROPOFOL;  Surgeon: Otis Brace, MD;  Location: [facility];  Service: Gastroenterology;  Laterality: Left;   Colostomy     FRACTURE SURGERY      MANDIBLE FRACTURE SURGERY     TEE WITHOUT CARDIOVERSION N/A 11/29/2021   Procedure: TRANSESOPHAGEAL ECHOCARDIOGRAM (TEE);  Surgeon: Berniece Salines, DO;  [facility];  Service: Cardiovascular;  Laterality: N/A;   HPI:  56 year old female with polysubstance abuse, bipolar, cirrhosis, rectal cancer hypertension who is here with acute hypoxic respiratory failure and acute toxic encephalopathy in the setting of polysubstance abuse    Assessment / Plan / Recommendation  Clinical Impression  Pt known to ST service seen twice this year for swallow assessment. Results similar to previous evaluations with primary oral phase involvement due to lack of adequate dentition. She attempted but was unable to masticate the solid cracker and expectorated. Thin liquids with continuous sips was coordinated without signs of dysfunction as well as puree. Vocal quality somewhat hoarse and cough/volitional swallow normal. Recommend pt initiate Dys 2, thin liquids, pills with thin and ST follow up to upgrade texture as pt is able. SLP Visit Diagnosis: Dysphagia, oral phase (R13.11)    Aspiration Risk  Mild aspiration risk    Diet Recommendation Dysphagia 2 (Fine chop);Thin liquid   Liquid Administration via: Cup;Straw Medication Administration: Whole meds with liquid Supervision: Patient able to self feed Compensations: Minimize environmental distractions;Slow rate;Small sips/bites;Lingual sweep for clearance of pocketing Postural Changes: Seated upright at 90 degrees    Other  Recommendations Oral Care Recommendations: Oral care BID    Recommendations for follow up therapy are one component of a multi-disciplinary discharge planning process, led  by the attending physician.  Recommendations may be updated based on patient status, additional functional criteria and insurance authorization.  Follow up Recommendations No SLP follow up (TBD)      Assistance Recommended at Discharge  (TBD)  Functional  Status Assessment Patient has had a recent decline in their functional status and demonstrates the ability to make significant improvements in function in a reasonable and predictable amount of time.  Frequency and Duration min 2x/week  2 weeks       Prognosis Prognosis for Safe Diet Advancement: Good      Swallow Study   General Date of Onset: 04/08/22 HPI: 56 year old female with polysubstance abuse, bipolar, cirrhosis, rectal cancer hypertension who is here with acute hypoxic respiratory failure and acute toxic encephalopathy in the setting of polysubstance abuse Type of Study: Bedside Swallow Evaluation Previous Swallow Assessment:  (11/2021, 03/12/22 BSE's) Diet Prior to this Study: NPO;NG Tube Temperature Spikes Noted: No Respiratory Status: Room air History of Recent Intubation: No Behavior/Cognition: Alert;Cooperative;Pleasant mood;Requires cueing Oral Cavity Assessment: Within Functional Limits Oral Care Completed by SLP: No Oral Cavity - Dentition: Poor condition;Missing dentition Vision: Functional for self-feeding Self-Feeding Abilities: Able to feed self Patient Positioning: Upright in bed Baseline Vocal Quality: Hoarse Volitional Cough: Strong Volitional Swallow: Able to elicit    Oral/Motor/Sensory Function Overall Oral Motor/Sensory Function: Within functional limits   Ice Chips Ice chips: Not tested   Thin Liquid Thin Liquid: Within functional limits    Nectar Thick Nectar Thick Liquid: Not tested   Honey Thick Honey Thick Liquid: Not tested   Puree Puree: Within functional limits   Solid     Solid: Impaired Oral Phase Impairments: Impaired mastication Oral Phase Functional Implications:  (could not masticate and expectorated cracker)      Houston Siren 04/14/2022,9:11 AM

## 2022-04-14 NOTE — TOC Initial Note (Signed)
Transition of Care [facility]) - Initial/Assessment Note    Patient Details  Name: Sherry Baker MRN: [id number] Date of Birth: Sep 13, 1966  Transition of Care [facility]) CM/SW Contact:    Milinda Antis, [place] Phone Number: 04/14/2022, 12:01 PM  Clinical Narrative:                 CSW received consult for possible SNF placement at time of discharge. CSW spoke with patient at bedside.  Patient was alert and oriented to self and place.  There were times of intermittent confusion and difficulty understanding patient.  The patient consistently declined going to a SNF.  When asked about home health, the patient also declined and stated that she would go to her primary care doctor.  No further questions reported at this time.   Skilled Nursing Rehab Facilities-   [email or web address]   Ratings out of 5 possible   Name Address  Phone # [place] Inspection Overall  [facility] [address] [place] '4 5 2 3  '$ [facility]  [address], [place] [phone] '3 2 5 5  '$ [facility] [place], [place] '3 1 1 1  '$ [facility], [place] '3 2 4 4  '$ [facility] [address], [place] [employer] '1 1 2 1  '$ [place] '2 1 4 3  '$ [facility] [address], [place] '5 2 3 4  '$ [facility] [address], [place] '5 2 2 3  '$ [address] ([place]) [place], [place] [phone] '5 1 2 2  '$ [facility] [phone], [place] [phone] '4 1 2 1  '$ [facility] [address] [facility] [phone] '4 1 2 1  '$ [facility]) [place]. [place], [place] [phone] '4 1 1 1  '$ Dustin Flock [address] [place] [phone] '3 2 4 4          '$ [place] '4 2 3 3  '$ [facility] [address], [place] '4 1 5 4  '$ [place], [place] [phone] '2 1 1 1  '$ [facility] [address], [employer] [phone] '2 1 3 2          '$ [address] (no [facility]) [place] [address], [place] [phone] '4 5 5 5  '$ [facility] (No Humana) [address], [place] '3 1 4 3  '$ [facility] (No [employer]HC) [place], [place] '5 5 5 5  '$ [facility] [address] [employer] [phone] '3 2 4 4  '$ [facility] [place] [address] [place] '1 1 2 1  '$ [place] [address], [place] [phone] '2 1 1 1  '$ [place], [place] '5 2 4 5  '$ [facility] [address], [place] '3 1 1 1  '$ [facility] [place], [place] '2 1 2 1          '$ [facility] [address], [place] [phone] '1 1 1 1  '$ [place] [address], [place]  [phone] '2 4 2 2  '$ [facility] [place] [address], [place] [phone] '5 2 3 4  '$ [place] [address] [place] 2 1  [place] [place] (No Humana) [address] [place] '2 1 3 2  '$ [facility] [address] [place] [phone] '3 1 1 1          '$ [facility] [place], [place] '5 4 5 5  '$ [facility])  [address], [place] '2 2 3 3  '$ [facility]) [place] [address] [place] '3 2 4 4  '$ [place] [address] [place] '4 3 4 4  '$ [address] [place], [place] '3 3 1 1  '$ [facility]) [address] [place] [phone] '2 2 4 4     '$ Expected Discharge Plan: [place] Barriers to Discharge: Continued Medical Work  up   Patient Goals and CMS Choice Patient states their goals for this hospitalization and ongoing recovery are:: To go home CMS Medicare.gov Compare Post Acute Care list provided to:: Patient Choice offered to / list presented to : Patient  Expected Discharge Plan and Services Expected Discharge Plan: [place] arrangements for the past 2 months: Apartment                                      Prior Living Arrangements/Services Living arrangements for the past 2 months: Apartment Lives with:: Roommate Patient language and need for interpreter reviewed:: Yes Do you feel safe going back to the place where you live?: Yes      Need for Family Participation in Patient Care: Yes (Comment) Care giver support system in place?: No (comment)   Criminal Activity/Legal Involvement Pertinent to Current Situation/Hospitalization: No - Comment as needed  Activities of Daily Living      Permission Sought/Granted                  Emotional Assessment Appearance:: Appears older than stated age Attitude/Demeanor/Rapport: Inconsistent Affect (typically observed): Stable Orientation: : Oriented to Self, Oriented to Place Alcohol / Substance Use: Illicit Drugs Psych Involvement: No (comment)  Admission diagnosis:  Polysubstance abuse ([place]) [F19.10] Opioid overdose ([place]) [T40.2X1A] Injury of head, initial encounter [S09.90XA] Altered mental status, unspecified altered mental status type [R41.82] Pressure injury of skin of sacral region, unspecified injury stage [L89.159] Overdose of undetermined intent, initial encounter [T50.904A] Patient Active Problem List   Diagnosis Date Noted   Acidosis    Opioid overdose ([place]) 04/08/2022   Pressure injury of skin 03/11/2022   Protein-calorie malnutrition, severe 03/10/2022   UTI (urinary tract infection) 03/06/2022   Hypoglycemia 03/06/2022   Hypernatremia 03/06/2022   Cocaine abuse ([place]) 03/06/2022    High anion gap metabolic acidosis 18/29/9371   Elevated serum creatinine 03/06/2022   Hypercalcemia 03/06/2022   AMS (altered mental status) 03/06/2022   Acquired hypothyroidism    HTN (hypertension)    Endotracheally intubated    Acute encephalopathy 03/05/2022   Protein-calorie malnutrition, severe 02/04/2022   Pressure injury of skin 02/03/2022   Acute encephalopathy 02/02/2022   Chronic Sacral wound, subsequent encounter 12/13/2021   Right hip pain causing ambulatory dysfunction 11/26/2021   Cellulitis of left hand 11/25/2021   Hyponatremia 11/25/2021   CKD (chronic kidney disease), stage III ([place]) 11/25/2021   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/25/2021   MRSA bacteremia w/ associated pustular rash 11/25/2021   Alcohol use disorder, severe, dependence (  [place])    Acute respiratory failure with hypoxia ([place])    Physical deconditioning    Ambulatory dysfunction    Underweight 06/25/2021   Protein-calorie malnutrition, severe 06/25/2021   Homelessness 16/94/5038   Alcoholic cirrhosis of liver without ascites ([place]) 06/23/2021   Alcohol abuse 06/14/2019   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   Normochromic normocytic anemia 06/06/2019   Pressure injury of skin 06/06/2019   Substance abuse ([place])    Seizure ([place]) 05/21/2019   Polysubstance abuse with associated depression and anxiety 05/18/2019   Genetic testing 03/24/2017   Hypertension 02/18/2017   GERD (gastroesophageal reflux disease) 02/18/2017   Rectal cancer ([place]) 01/27/2017   Rectal mass    Anemia 01/16/2017   Constipation    Alcohol abuse    Tobacco abuse    Cocaine abuse ([place])    Bipolar affective disorder ([place])    Severe malnutrition ([place]) 02/13/2014   Substance abuse ([place]) 02/11/2014   Depression 02/11/2014   Thrombocytopenia ([place]) 02/11/2014   PCP:  Merryl Hacker, No Pharmacy:   [id number] [place], [place]  [address] Phone: [phone] Fax: [phone]  [facility] [address] [place] [address] Phone: [phone] Fax: [phone]     Social Determinants of Health (SDOH) Interventions    Readmission Risk Interventions    12/10/2021    1:58 PM 11/15/2021    5:02 PM 09/30/2021    9:37 AM  Readmission Risk Prevention Plan  Transportation Screening Complete Complete Complete  Medication Review Press photographer) Complete Complete Complete  PCP or Specialist appointment within 3-5 days of discharge Complete Complete Complete  HRI or Home Care Consult Not Complete  Not Complete  HRI or Home Care Consult Pt Refusal Comments history of unsafe home environment    SW Recovery Care/Counseling Consult Complete  Complete  Palliative Care Screening Not Applicable Not Applicable Not [place] Not Applicable Not Applicable Not Applicable

## 2022-04-14 NOTE — Progress Notes (Signed)
PROGRESS NOTE    Sherry Baker  GYI:[id number] DOB: February 20, 1966 DOA: 04/08/2022 PCP: Pcp, No    Brief Narrative:  56 year old female with history of polysubstance abuse, hypertension, and iron deficiency anemia, CKD stage IIIb, colon cancer with history of post colostomy, bipolar 1 disorder presented to the ER with altered mental status.  She was found at home after a witnessed report that she was reading in pain on the floor and a PO2 8 her forehead, responsive to painful stimuli.  EMS was called, Narcan was given with response. Recent admission to the hospital with Enterococcus faecalis bacteremia, negative for endocarditis and treated with Augmentin while she left AMA.  6/6: Presented to the ED after being found on the floor at home, suspected opioid overdose.  Admitted to the ICU for naloxone infusion 6/6 overnight, required intubation due to AMS. CT head neg. Seizure like activity ? Withdrawal. EEG negative. 6/8 agitation with sedation wean, HR up to 160s sustained. Wean stopped. 6/9 became completely unresponsive, ABG did not show CO2 retention, CT head was negative for acute findings 6/10 extubated 6/12 transferred out of ICU with NG tube feeding and nasal cannula oxygen.  Assessment & Plan:   Acute metabolic encephalopathy and unresponsiveness secondary to polysubstance abuse, suspected opiate overdose: Acute hypoxemic respiratory failure secondary to altered mental status, improved. MRSA pneumonia:  Presented with altered mental status, hypothermia and bradycardia and responded to naloxone.  UDS was positive for benzos and cocaine and negative for opiates, response to naloxone so there is a suspicion she overdosed on opiates. Intubated for airway protection, extubated and currently on room air. Respiratory cultures with MRSA, will treat with linezolid for 7 days. Patient with more wakefulness today, passed bedside swallow evaluation, will start on dysphagia diet.  Discontinue NG  tube feeding.  AKI on CKD stage IIIb, hyperkalemia: Improved.  Encourage oral intake.  Essential hypertension: Blood pressure stable on Coreg.  Anxiety and depression: Reportedly treated with BuSpar Lyrica and Atarax, however records indicate she is not taking any of these medications.  Will not start on any medications.  Hypophosphatemia: Replace and monitor levels.  Severe protein calorie malnutrition: Encourage nutrition. Nutrition Status: Nutrition Problem: Severe Malnutrition Etiology: social / environmental circumstances (polysubstance abuse) Signs/Symptoms: severe muscle depletion, severe fat depletion Interventions: Tube feeding, Prostat, MVI, Other (Comment) (checking labs, zinc, vitamin C)    Colon cancer status post colostomy: Normal functioning ostomy.  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 (Active)  03/06/22 1900  Location: Sacrum  Location Orientation:   Staging: Stage 4 - Full thickness tissue loss with exposed bone, tendon or muscle.  Wound Description (Comments):   Present on Admission: Yes     Pressure Injury 04/09/22 Buttocks Right Stage 2 -  Partial thickness loss of dermis presenting as a shallow open injury with a red, pink wound bed without slough. pink, red (Active)  04/09/22 0415  Location: Buttocks  Location Orientation: Right  Staging: Stage 2 -  Partial thickness loss of dermis presenting as a shallow open injury with a red, pink wound bed without slough.  Wound Description (Comments): pink, red  Present on Admission: Yes     Pressure Injury 04/09/22 Buttocks Left Stage 2 -  Partial thickness loss of dermis presenting as a shallow open injury with a red, pink wound bed without slough. pink, red (Active)  04/09/22 0415  Location: Buttocks  Location Orientation: Left  Staging: Stage 2 -  Partial  thickness loss of dermis presenting as a shallow open injury with a red, pink wound bed  without slough.  Wound Description (Comments): pink, red  Present on Admission: Yes     Pressure Injury 04/13/22 Buttocks Right Deep Tissue Pressure Injury - Purple or maroon localized area of discolored intact skin or blood-filled blister due to damage of underlying soft tissue from pressure and/or shear. (Active)  04/13/22 2042  Location: Buttocks  Location Orientation: Right  Staging: Deep Tissue Pressure Injury - Purple or maroon localized area of discolored intact skin or blood-filled blister due to damage of underlying soft tissue from pressure and/or shear.  Wound Description (Comments):   Present on Admission:      Pressure Injury 04/13/22 Toe (Comment  which one) Anterior;Right Deep Tissue Pressure Injury - Purple or maroon localized area of discolored intact skin or blood-filled blister due to damage of underlying soft tissue from pressure and/or shear. (Active)  04/13/22 2042  Location: Toe (Comment  which one)  Location Orientation: Anterior;Right  Staging: Deep Tissue Pressure Injury - Purple or maroon localized area of discolored intact skin or blood-filled blister due to damage of underlying soft tissue from pressure and/or shear.  Wound Description (Comments):   Present on Admission:        DVT prophylaxis: heparin injection 5,000 Units Start: 04/08/22 1515   Code Status: Full code Family Communication: None Disposition Plan: Status is: Inpatient Remains inpatient appropriate because: On active treatment.  IV antibiotics.  Will need short-term rehab placement.     Consultants:  Critical care  Procedures:  None  Antimicrobials:  Linezolid 6/7---   Subjective: Patient seen and examined.  Patient tells me she is doing fine.  Patient was able to tell me she is at [facility], she is able to tell me she lives with a roommate.  Still has some periods of confusion and difficult understanding with NG tube in place.  She tells me that she will not go to  rehab but get better at hospital.  No other overnight events.  Remains with soft restraints.  Objective: Vitals:   04/13/22 2037 04/14/22 0038 04/14/22 0509 04/14/22 0922  BP: (!) 105/51 117/65 120/71 (!) 133/123  Pulse: (!) 48 65 65 86  Resp: '19 18 18 16  '$ Temp: 98.7 F (37.1 C) 99.2 F (37.3 C) 99.6 F (37.6 C)   TempSrc: Oral     SpO2: 100% 100% 100% 100%  Weight: 42.9 kg     Height: '5\' 2"'$  (1.575 m)       Intake/Output Summary (Last 24 hours) at 04/14/2022 1023 Last data filed at 04/14/2022 0624 Gross per 24 hour  Intake 1567.53 ml  Output 1050 ml  Net 517.53 ml   Filed Weights   04/11/22 0500 04/12/22 0500 04/13/22 2037  Weight: 46.8 kg 46.5 kg 42.9 kg    Examination:  General exam: Chronically sick looking.  Frail and debilitated.  Cachectic. Respiratory system: Some conducted upper airway sounds.  Otherwise clear.  On 2 L oxygen. Cardiovascular system: S1 & S2 heard, RRR.  Gastrointestinal system: Abdomen is nondistended, soft and nontender. No organomegaly or masses felt. Normal bowel sounds heard.  Colostomy left lower quadrant pink and flowing. Central nervous system: Alert and oriented x2-3.  Moves all extremities. Extremities:  Pressure ulcers described as above.    Data Reviewed: I have personally reviewed following labs and imaging studies  CBC: Recent Labs  Lab 04/08/22 1236 04/08/22 1252 04/09/22 0545 04/10/22 0324 04/11/22 0118 04/11/22  1135 04/12/22 0627 04/13/22 0032  WBC 4.1  --  5.5 6.3 12.1*  --  8.1 7.3  NEUTROABS 3.0  --   --   --   --   --   --   --   HGB 11.7*   < > 11.8* 10.8* 11.4* 10.2* 8.6* 9.0*  HCT 36.8   < > 38.1 34.8* 36.6 30.0* 27.2* 28.3*  MCV 89.1  --  89.4 90.2 88.8  --  88.0 87.1  PLT 248  --  268 232 224  --  166 182   < > = values in this interval not displayed.   Basic Metabolic Panel: Recent Labs  Lab 04/10/22 0324 04/10/22 1658 04/11/22 0118 04/11/22 1135 04/11/22 1621 04/12/22 0627 04/13/22 0032  04/14/22 0304  NA 142  --  141 141  --  140 138 138  K 4.2  --  5.6* 4.5  --  3.8 2.8* 4.2  CL 119*  --  110  --   --  105 98 100  CO2 15*  --  20*  --   --  '24 29 27  '$ GLUCOSE 114*  --  142*  --   --  126* 128* 122*  BUN 15  --  29*  --   --  42* 39* 36*  CREATININE 1.35*  --  2.03*  --   --  2.22* 1.97* 1.66*  CALCIUM 8.7*  --  8.0*  --   --  7.5* 8.0* 8.6*  MG 1.8 2.7* 2.7*  --  2.4  --   --  1.9  PHOS 1.3* 4.7* 4.6  --  4.4  --   --  1.8*   GFR: Estimated Creatinine Clearance: 26.2 mL/min (A) (by C-G formula based on SCr of 1.66 mg/dL (H)). Liver Function Tests: Recent Labs  Lab 04/08/22 1236  AST 18  ALT 11  ALKPHOS 65  BILITOT 0.4  PROT 6.9  ALBUMIN 2.8*   No results for input(s): "LIPASE", "AMYLASE" in the last 168 hours. Recent Labs  Lab 04/08/22 1236  AMMONIA 32   Coagulation Profile: No results for input(s): "INR", "PROTIME" in the last 168 hours. Cardiac Enzymes: Recent Labs  Lab 04/08/22 1236  CKTOTAL 68   BNP (last 3 results) No results for input(s): "PROBNP" in the last 8760 hours. HbA1C: No results for input(s): "HGBA1C" in the last 72 hours. CBG: Recent Labs  Lab 04/13/22 1601 04/13/22 1916 04/14/22 0019 04/14/22 0507 04/14/22 0729  GLUCAP 103* 113* 112* 109* 124*   Lipid Profile: Recent Labs    04/12/22 0627  TRIG 83   Thyroid Function Tests: No results for input(s): "TSH", "T4TOTAL", "FREET4", "T3FREE", "THYROIDAB" in the last 72 hours. Anemia Panel: No results for input(s): "VITAMINB12", "FOLATE", "FERRITIN", "TIBC", "IRON", "RETICCTPCT" in the last 72 hours. Sepsis Labs: Recent Labs  Lab 04/08/22 1816  LATICACIDVEN 1.4    Recent Results (from the past 240 hour(s))  MRSA Next Gen by PCR, Nasal     Status: Abnormal   Collection Time: 04/08/22  7:40 PM   Specimen: Nasal Mucosa; Nasal Swab  Result Value Ref Range Status   MRSA by PCR Next Gen DETECTED (A) NOT DETECTED Final    Comment: RESULT CALLED TO, READ BACK BY AND  VERIFIED WITH: RN SARAH CIENER 04/08/22'@22'$ :25 BY TW (NOTE) The GeneXpert MRSA Assay (FDA approved for NASAL specimens only), is one component of a comprehensive MRSA colonization surveillance program. It is not intended to diagnose MRSA infection nor  to guide or monitor treatment for MRSA infections. Test performance is not FDA approved in patients less than 29 years old. Performed at [facility], [place] [address] [place], [place] [address]   Culture, Respiratory w Gram Stain     Status: None   Collection Time: 04/08/22 11:51 PM   Specimen: Tracheal Aspirate; Respiratory  Result Value Ref Range Status   Specimen Description TRACHEAL ASPIRATE  Final   Special Requests NONE  Final   Gram Stain   Final    NO SQUAMOUS EPITHELIAL CELLS SEEN FEW WBC SEEN FEW GRAM POSITIVE RODS MODERATE GRAM POSITIVE COCCI Performed at [facility], [place] [address] [place], [place] [address]    Culture   Final    MODERATE METHICILLIN RESISTANT STAPHYLOCOCCUS AUREUS   Report Status 04/11/2022 FINAL  Final   Organism ID, Bacteria METHICILLIN RESISTANT STAPHYLOCOCCUS AUREUS  Final      Susceptibility   Methicillin resistant staphylococcus aureus - MIC*    CIPROFLOXACIN >=8 RESISTANT Resistant     ERYTHROMYCIN >=8 RESISTANT Resistant     GENTAMICIN <=0.5 SENSITIVE Sensitive     OXACILLIN >=4 RESISTANT Resistant     TETRACYCLINE <=1 SENSITIVE Sensitive     VANCOMYCIN <=0.5 SENSITIVE Sensitive     TRIMETH/SULFA >=320 RESISTANT Resistant     CLINDAMYCIN <=0.25 SENSITIVE Sensitive     RIFAMPIN <=0.5 SENSITIVE Sensitive     Inducible Clindamycin NEGATIVE Sensitive     * MODERATE METHICILLIN RESISTANT STAPHYLOCOCCUS AUREUS  Culture, blood (Routine X 2) w Reflex to ID Panel     Status: None   Collection Time: 04/09/22 10:39 AM   Specimen: BLOOD LEFT HAND  Result Value Ref Range Status   Specimen Description BLOOD LEFT HAND  Final   Special Requests   Final    BOTTLES DRAWN AEROBIC ONLY Blood  Culture results may not be optimal due to an inadequate volume of blood received in culture bottles   Culture   Final    NO GROWTH 5 DAYS Performed at [facility], [place] [address], [place], [place] [address]    Report Status 04/14/2022 FINAL  Final  Culture, blood (Routine X 2) w Reflex to ID Panel     Status: None   Collection Time: 04/09/22 11:02 AM   Specimen: BLOOD LEFT HAND  Result Value Ref Range Status   Specimen Description BLOOD LEFT HAND  Final   Special Requests   Final    BOTTLES DRAWN AEROBIC ONLY Blood Culture adequate volume   Culture   Final    NO GROWTH 5 DAYS Performed at [facility], [place] [address] [place],  [address]    Report Status 04/14/2022 FINAL  Final         Radiology Studies: No results found.      Scheduled Meds:  vitamin C  250 mg Oral BID   [START ON 04/15/2022] aspirin  81 mg Oral Daily   carvedilol  6.25 mg Oral BID WC   Chlorhexidine Gluconate Cloth  6 each Topical Daily   folic acid  1 mg Intravenous Daily   furosemide  40 mg Intravenous Daily   heparin  5,000 Units Subcutaneous Q8H   ipratropium-albuterol  3 mL Nebulization Once   leptospermum manuka honey  1 application  Topical Daily   mouth rinse  15 mL Mouth Rinse BID   [START ON 04/15/2022] multivitamin with minerals  1 tablet Oral Daily   pantoprazole sodium  40 mg  Per Tube Daily   [START ON 04/15/2022] polyethylene glycol  17 g Oral Daily   thiamine injection  100 mg Intravenous Daily   zinc sulfate  220 mg Oral BID   Continuous Infusions:  sodium chloride Stopped (04/14/22 0305)   linezolid (ZYVOX) IV 600 mg (04/14/22 0913)   potassium PHOSPHATE IVPB (in mmol)       LOS: 6 days    Time spent: 35 minutes    Barb Merino, MD [facility] Pager [phone]

## 2022-04-14 NOTE — Progress Notes (Signed)
Nutrition Follow-up  DOCUMENTATION CODES:   Underweight, Severe malnutrition in context of chronic illness  INTERVENTION:   Transition to nocturnal tube feeds via Cortrak: - Osmolite 1.2 @ 75 ml/hr x 12 hours from 6 PM to 6 AM (total of 900 ml)  Nocturnal tube feeding regimen provides 1080 kcal, 50 grams of protein, and 738 ml of H2O (meets 70% of minimum kcal needs and 67% of minimum protein needs).  - Ensure Enlive po TID, each supplement provides 350 kcal and 20 grams of protein  - Encourage PO intake  NUTRITION DIAGNOSIS:   Severe Malnutrition related to social / environmental circumstances (polysubstance abuse) as evidenced by severe muscle depletion, severe fat depletion.  Ongoing, being addressed via diet advancement, oral nutrition supplements, and nocturnal TF  GOAL:   Patient will meet greater than or equal to 90% of their needs  Progressing  MONITOR:   Vent status, Labs, Weight trends, TF tolerance, Skin  REASON FOR ASSESSMENT:   Ventilator, Consult Enteral/tube feeding initiation and management  ASSESSMENT:   56 year old female who presented to the ED on 6/06 with AMS. PMH of bipolar 1 disorder, EtOH abuse, polysubstance abuse, cirrhosis, chronic pain syndrome, HTN, CKD stage IIIb, colon cancer s/p colostomy. Pt required intubation. Pt admitted for opioid overdose, AKI.  06/07 - OG tube exchanged for Cortrak (tip gastric) 06/10 - extubated 06/12 - diet advanced to dysphagia 2 with thin liquids  Spoke with pt at bedside. Pt in good spirits at time of RD visit with continuous tube feeds infusing as ordered via Cortrak. Noted pt consumed ~10% of breakfast meal tray. Pt reports that she does not like what was served. Pt is willing to drink chocolate Ensure supplements. RD to order between meals. Discussed with RN and MD. Plan to transition to nocturnal tube feeds to provide additional kcal and protein as pt's appetite and PO intake improves.  Vitamin and  mineral labs have resulted. No deficiencies noted; vitamin C is on low end of normal. Will continue vitamin C, zinc sulfate, and MVI with minerals at this time to support wound healing.  Admit weight: 45 kg Current weight: 42.9 kg  Medications reviewed and include: vitamin C 161 mg BID, folic acid, IV lasix, MVI with minerals, protonix, miralax, thiamine, zinc sulfate 220 mg BID, IV abx, IV potassium phosphate 20 mmol once  Vitamin/Mineral Profile: Vitamin A: 44.4 (WNL) Vitamin D: 30.72 (WNL) Vitamin C: 0.4 (low WNL) Zinc: 86 (WNL) CRP: 4.9 (high)  Labs reviewed: BUN 36, creatinine 1.66, phosphorus 1.8, hemoglobin 9.0  UOP: 725 ml x 24 hours Colostomy: 325 ml x 24 hours I/O's: +7.0 L since admit  Diet Order:   Diet Order             DIET DYS 2 Room service appropriate? Yes; Fluid consistency: Thin  Diet effective now                   EDUCATION NEEDS:   Not appropriate for education at this time  Skin:  Skin Assessment: Skin Integrity Issues: DTI: R buttock, R foot toe Stage II: R buttock, L buttock Stage IV: sacrum  Last BM:  04/13/22 colostomy  Height:   Ht Readings from Last 1 Encounters:  04/13/22 '5\' 2"'$  (1.575 m)    Weight:   Wt Readings from Last 1 Encounters:  04/13/22 42.9 kg    BMI:  Body mass index is 17.28 kg/m.  Estimated Nutritional Needs:   Kcal:  1550-1750  Protein:  75-90 grams  Fluid:  1.5-1.7 L    Gustavus Bryant, MS, RD, LDN Inpatient Clinical Dietitian Please see AMiON for contact information.

## 2022-04-15 DIAGNOSIS — T402X1A Poisoning by other opioids, accidental (unintentional), initial encounter: Secondary | ICD-10-CM | POA: Diagnosis not present

## 2022-04-15 DIAGNOSIS — J9601 Acute respiratory failure with hypoxia: Secondary | ICD-10-CM | POA: Diagnosis not present

## 2022-04-15 LAB — COMPREHENSIVE METABOLIC PANEL
ALT: 13 U/L (ref 0–44)
AST: 13 U/L — ABNORMAL LOW (ref 15–41)
Albumin: 2 g/dL — ABNORMAL LOW (ref 3.5–5.0)
Alkaline Phosphatase: 69 U/L (ref 38–126)
Anion gap: 11 (ref 5–15)
BUN: 32 mg/dL — ABNORMAL HIGH (ref 6–20)
CO2: 24 mmol/L (ref 22–32)
Calcium: 8.6 mg/dL — ABNORMAL LOW (ref 8.9–10.3)
Chloride: 98 mmol/L (ref 98–111)
Creatinine, Ser: 1.48 mg/dL — ABNORMAL HIGH (ref 0.44–1.00)
GFR, Estimated: 42 mL/min — ABNORMAL LOW (ref >60)
Glucose, Bld: 119 mg/dL — ABNORMAL HIGH (ref 70–99)
Potassium: 4.5 mmol/L (ref 3.5–5.1)
Sodium: 133 mmol/L — ABNORMAL LOW (ref 135–145)
Total Bilirubin: 0.3 mg/dL (ref 0.3–1.2)
Total Protein: 7.1 g/dL (ref 6.5–8.1)

## 2022-04-15 LAB — GLUCOSE, CAPILLARY
Glucose-Capillary: 210 mg/dL — ABNORMAL HIGH (ref 70–99)
Glucose-Capillary: 103 mg/dL — ABNORMAL HIGH (ref 70–99)
Glucose-Capillary: 85 mg/dL (ref 70–99)
Glucose-Capillary: 90 mg/dL (ref 70–99)

## 2022-04-15 LAB — CBC WITH DIFFERENTIAL/PLATELET
Abs Immature Granulocytes: 0.5 10*3/uL — ABNORMAL HIGH (ref 0.00–0.07)
Basophils Absolute: 0.1 10*3/uL (ref 0.0–0.1)
Basophils Relative: 1 %
Eosinophils Absolute: 0 10*3/uL (ref 0.0–0.5)
Eosinophils Relative: 0 %
HCT: 33.8 % — ABNORMAL LOW (ref 36.0–46.0)
Hemoglobin: 11 g/dL — ABNORMAL LOW (ref 12.0–15.0)
Immature Granulocytes: 5 %
Lymphocytes Relative: 10 %
Lymphs Abs: 0.9 10*3/uL (ref 0.7–4.0)
MCH: 27.4 pg (ref 26.0–34.0)
MCHC: 32.5 g/dL (ref 30.0–36.0)
MCV: 84.3 fL (ref 80.0–100.0)
Monocytes Absolute: 0.8 10*3/uL (ref 0.1–1.0)
Monocytes Relative: 8 %
Neutro Abs: 7.5 10*3/uL (ref 1.7–7.7)
Neutrophils Relative %: 76 %
Platelets: 260 10*3/uL (ref 150–400)
RBC: 4.01 MIL/uL (ref 3.87–5.11)
RDW: 18 % — ABNORMAL HIGH (ref 11.5–15.5)
WBC: 9.8 10*3/uL (ref 4.0–10.5)
nRBC: 0.2 % (ref 0.0–0.2)

## 2022-04-15 LAB — PHOSPHORUS: Phosphorus: 3 mg/dL (ref 2.5–4.6)

## 2022-04-15 LAB — MAGNESIUM: Magnesium: 1.9 mg/dL (ref 1.7–2.4)

## 2022-04-15 MED ORDER — LINEZOLID 600 MG PO TABS
600.0000 mg | ORAL_TABLET | Freq: Two times a day (BID) | ORAL | Status: AC
Start: 2022-04-15 — End: 2022-04-16
  Administered 2022-04-15 – 2022-04-16 (×4): 600 mg via ORAL
  Filled 2022-04-15 (×4): qty 1

## 2022-04-15 NOTE — Progress Notes (Signed)
Speech Language Pathology Treatment: Dysphagia  Patient Details Name: Sherry Baker MRN: [id number] DOB: February 19, 1966 Today's Date: 04/15/2022 Time: 2035-5974 SLP Time Calculation (min) (ACUTE ONLY): 13 min  Assessment / Plan / Recommendation Clinical Impression  Pt is at baseline function with regard to swallowing.  She is masticating well despite absence of teeth.  She is attentive to POs.  She is protecting her airway and there are no s/s of aspiration.  She is generally disinterested in food - this is consistent with her behavior during prior admissions.  Spoke with Dr. Sloan Leiter - will advance diet back to mechanical soft/dysphagia 3 with thin liquids. Meds whole with liquid. Cortrak may be D/Cd per Dr. Sloan Leiter.  SLP will sign off at this time.    HPI HPI: 56 year old female with polysubstance abuse, bipolar, cirrhosis, rectal cancer hypertension who is here with acute hypoxic respiratory failure and acute toxic encephalopathy in the setting of polysubstance abuse      SLP Plan  All goals met      Recommendations for follow up therapy are one component of a multi-disciplinary discharge planning process, led by the attending physician.  Recommendations may be updated based on patient status, additional functional criteria and insurance authorization.    Recommendations  Diet recommendations: Dysphagia 3 (mechanical soft);Thin liquid Liquids provided via: Cup;Straw Medication Administration: Whole meds with liquid Supervision: Patient able to self feed Compensations: Minimize environmental distractions                Oral Care Recommendations: Oral care BID Follow Up Recommendations: No SLP follow up SLP Visit Diagnosis: Dysphagia, oral phase (R13.11) Plan: All goals met          Sherry Baker L. Tivis Ringer, MA CCC/SLP Clinical Specialist - Acute Care SLP Acute Rehabilitation Services Office number [phone]  Sherry Baker  04/15/2022, 9:40 AM

## 2022-04-15 NOTE — Progress Notes (Signed)
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  She was found at home after a witnessed report that she was reading in pain on the floor and a PO2 8 her forehead, responsive to painful stimuli.  EMS was called, Narcan was given with response. Recent admission to the hospital with Enterococcus faecalis bacteremia, negative for endocarditis and treated with Augmentin while she left AMA.  6/6: Presented to the ED after being found on the floor at home, suspected opioid overdose.  Admitted to the ICU for naloxone infusion 6/6 overnight, required intubation due to AMS. CT head neg. Seizure like activity ? Withdrawal. EEG negative. 6/8 agitation with sedation wean, HR up to 160s sustained. Wean stopped. 6/9 became completely unresponsive, ABG did not show CO2 retention, CT head was negative for acute findings 6/10 extubated 6/12 transferred out of ICU with NG tube feeding and nasal cannula oxygen.  Assessment & Plan:   Acute metabolic encephalopathy and unresponsiveness secondary to polysubstance abuse, suspected opiate overdose: Acute hypoxemic respiratory failure secondary to altered mental status, improved. MRSA pneumonia:  Presented with altered mental status, hypothermia and bradycardia and responded to naloxone.  UDS was positive for benzos and cocaine and negative for opiates, response to naloxone so there is a suspicion she overdosed on opiates. Intubated for airway protection, extubated and currently on room air. Respiratory cultures with MRSA, will treat with linezolid for 7 days.  Changed to oral antibiotics today. Patient with more wakefulness today, passed bedside swallow evaluation, advance  diet. Discontinue NG tube feeding and advance diet as patient has every chance that she may leave AMA.  AKI on CKD stage IIIb, hyperkalemia: Improved.  Encourage oral intake.  Essential hypertension: Blood pressure stable on Coreg.  Anxiety and depression: Reportedly treated with BuSpar Lyrica and Atarax, however records indicate she is not taking any of these medications.  Will not start on any medications as she does not have reliable follow-up.  Hypophosphatemia: Replaced and improved.  Severe protein calorie malnutrition: Encourage nutrition. Nutrition Status: Nutrition Problem: Severe Malnutrition Etiology: social / environmental circumstances (polysubstance abuse) Signs/Symptoms: severe muscle depletion, severe fat depletion Interventions: Tube feeding, Prostat, MVI, Other (Comment) (checking labs, zinc, vitamin C)    Colon cancer status post colostomy: Normal functioning ostomy.  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 (Active)  03/06/22 1900  Location: Sacrum  Location Orientation:   Staging: Stage 4 - Full thickness tissue loss with exposed bone, tendon or muscle.  Wound Description (Comments):   Present on Admission: Yes     Pressure Injury 04/09/22 Buttocks Right Stage 2 -  Partial thickness loss of dermis presenting as a shallow open injury with a red, pink wound bed without slough. pink, red (Active)  04/09/22 0415  Location: Buttocks  Location Orientation: Right  Staging: Stage 2 -  Partial thickness loss of dermis presenting as a shallow open injury with a red, pink wound bed without slough.  Wound Description (Comments): pink, red  Present on Admission: Yes     Pressure Injury 04/09/22 Buttocks Left Stage 2 -  Partial thickness loss of dermis presenting as a shallow open injury with a red, pink wound bed without  slough. pink, red (Active)  04/09/22 0415  Location: Buttocks  Location Orientation:  Left  Staging: Stage 2 -  Partial thickness loss of dermis presenting as a shallow open injury with a red, pink wound bed without slough.  Wound Description (Comments): pink, red  Present on Admission: Yes     Pressure Injury 04/13/22 Buttocks Right Deep Tissue Pressure Injury - Purple or maroon localized area of discolored intact skin or blood-filled blister due to damage of underlying soft tissue from pressure and/or shear. (Active)  04/13/22 2042  Location: Buttocks  Location Orientation: Right  Staging: Deep Tissue Pressure Injury - Purple or maroon localized area of discolored intact skin or blood-filled blister due to damage of underlying soft tissue from pressure and/or shear.  Wound Description (Comments):   Present on Admission:      Pressure Injury 04/13/22 Toe (Comment  which one) Anterior;Right Deep Tissue Pressure Injury - Purple or maroon localized area of discolored intact skin or blood-filled blister due to damage of underlying soft tissue from pressure and/or shear. (Active)  04/13/22 2042  Location: Toe (Comment  which one)  Location Orientation: Anterior;Right  Staging: Deep Tissue Pressure Injury - Purple or maroon localized area of discolored intact skin or blood-filled blister due to damage of underlying soft tissue from pressure and/or shear.  Wound Description (Comments):   Present on Admission:        DVT prophylaxis: heparin injection 5,000 Units Start: 04/08/22 1515   Code Status: Full code Family Communication: None Disposition Plan: Status is: Inpatient Remains inpatient appropriate because: On active treatment.  IV antibiotics.  Will need short-term rehab placement however there is every chance he may decline.     Consultants:  Critical care  Procedures:  None  Antimicrobials:  Linezolid 6/7---   Subjective:  Patient seen and examined.  Denies any complaints.  More awake and alert today.  On room air.  Patient tells me that she would like to  go home not to go to rehab.  Still has NG tube for nocturnal feeding.  We discussed about monitoring in the hospital today, improve mobility with therapies and decide about going home.  I still recommended she will benefit with short-term rehab, however there is a chance she will decline this.  Objective: Vitals:   04/14/22 0509 04/14/22 0922 04/14/22 1556 04/14/22 2029  BP: 120/71 (!) 133/123 107/77 96/69  Pulse: 65 86 69 74  Resp: '18 16 16 18  '$ Temp: 99.6 F (37.6 C)   99.4 F (37.4 C)  TempSrc:    Oral  SpO2: 100% 100% 95% 96%  Weight:      Height:        Intake/Output Summary (Last 24 hours) at 04/15/2022 1030 Last data filed at 04/15/2022 0900 Gross per 24 hour  Intake 3125.82 ml  Output 1350 ml  Net 1775.82 ml   Filed Weights   04/11/22 0500 04/12/22 0500 04/13/22 2037  Weight: 46.8 kg 46.5 kg 42.9 kg    Examination:  General exam: Chronically sick looking.  Frail and debilitated.  Cachectic. Respiratory system: Some conducted upper airway sounds.  Otherwise clear.  On room air today. Cardiovascular system: S1 & S2 heard, RRR.  Gastrointestinal system: Abdomen is nondistended, soft and nontender. No organomegaly or masses felt. Normal bowel sounds heard.  Colostomy left lower quadrant with loose stool. Central nervous system: Alert and oriented x2-3.  Moves all extremities. Extremities:  Pressure ulcers described as above.    Data Reviewed: I have personally  reviewed following labs and imaging studies  CBC: Recent Labs  Lab 04/08/22 1236 04/08/22 1252 04/10/22 0324 04/11/22 0118 04/11/22 1135 04/12/22 0627 04/13/22 0032 04/15/22 0436  WBC 4.1   < > 6.3 12.1*  --  8.1 7.3 9.8  NEUTROABS 3.0  --   --   --   --   --   --  7.5  HGB 11.7*   < > 10.8* 11.4* 10.2* 8.6* 9.0* 11.0*  HCT 36.8   < > 34.8* 36.6 30.0* 27.2* 28.3* 33.8*  MCV 89.1   < > 90.2 88.8  --  88.0 87.1 84.3  PLT 248   < > 232 224  --  166 182 260   < > = values in this interval not displayed.    Basic Metabolic Panel: Recent Labs  Lab 04/10/22 1658 04/11/22 0118 04/11/22 1135 04/11/22 1621 04/12/22 0627 04/13/22 0032 04/14/22 0304 04/15/22 0436  NA  --  141 141  --  140 138 138 133*  K  --  5.6* 4.5  --  3.8 2.8* 4.2 4.5  CL  --  110  --   --  105 98 100 98  CO2  --  20*  --   --  '24 29 27 24  '$ GLUCOSE  --  142*  --   --  126* 128* 122* 119*  BUN  --  29*  --   --  42* 39* 36* 32*  CREATININE  --  2.03*  --   --  2.22* 1.97* 1.66* 1.48*  CALCIUM  --  8.0*  --   --  7.5* 8.0* 8.6* 8.6*  MG 2.7* 2.7*  --  2.4  --   --  1.9 1.9  PHOS 4.7* 4.6  --  4.4  --   --  1.8* 3.0   GFR: Estimated Creatinine Clearance: 29.4 mL/min (A) (by C-G formula based on SCr of 1.48 mg/dL (H)). Liver Function Tests: Recent Labs  Lab 04/08/22 1236 04/15/22 0436  AST 18 13*  ALT 11 13  ALKPHOS 65 69  BILITOT 0.4 0.3  PROT 6.9 7.1  ALBUMIN 2.8* 2.0*   No results for input(s): "LIPASE", "AMYLASE" in the last 168 hours. Recent Labs  Lab 04/08/22 1236  AMMONIA 32   Coagulation Profile: No results for input(s): "INR", "PROTIME" in the last 168 hours. Cardiac Enzymes: Recent Labs  Lab 04/08/22 1236  CKTOTAL 68   BNP (last 3 results) No results for input(s): "PROBNP" in the last 8760 hours. HbA1C: No results for input(s): "HGBA1C" in the last 72 hours. CBG: Recent Labs  Lab 04/14/22 0019 04/14/22 0507 04/14/22 0729 04/14/22 2001 04/15/22 0758  GLUCAP 112* 109* 124* 126* 210*   Lipid Profile: No results for input(s): "CHOL", "HDL", "LDLCALC", "TRIG", "CHOLHDL", "LDLDIRECT" in the last 72 hours.  Thyroid Function Tests: No results for input(s): "TSH", "T4TOTAL", "FREET4", "T3FREE", "THYROIDAB" in the last 72 hours. Anemia Panel: No results for input(s): "VITAMINB12", "FOLATE", "FERRITIN", "TIBC", "IRON", "RETICCTPCT" in the last 72 hours. Sepsis Labs: Recent Labs  Lab 04/08/22 1816  LATICACIDVEN 1.4    Recent Results (from the past 240 hour(s))  MRSA Next Gen by  PCR, Nasal     Status: Abnormal   Collection Time: 04/08/22  7:40 PM   Specimen: Nasal Mucosa; Nasal Swab  Result Value Ref Range Status   MRSA by PCR Next Gen DETECTED (A) NOT DETECTED Final    Comment: RESULT CALLED TO, READ BACK BY AND VERIFIED WITH:  RN Judson Roch CIENER 04/08/22'@22'$ :25 BY TW (NOTE) The GeneXpert MRSA Assay (FDA approved for NASAL specimens only), is one component of a comprehensive MRSA colonization surveillance program. It is not intended to diagnose MRSA infection nor to guide or monitor treatment for MRSA infections. Test performance is not FDA approved in patients less than 28 years old. Performed at [facility], [place] [address] [place], [place] [address]   Culture, Respiratory w Gram Stain     Status: None   Collection Time: 04/08/22 11:51 PM   Specimen: Tracheal Aspirate; Respiratory  Result Value Ref Range Status   Specimen Description TRACHEAL ASPIRATE  Final   Special Requests NONE  Final   Gram Stain   Final    NO SQUAMOUS EPITHELIAL CELLS SEEN FEW WBC SEEN FEW GRAM POSITIVE RODS MODERATE GRAM POSITIVE COCCI Performed at [facility], [place] [address] [place], [place] [address]    Culture   Final    MODERATE METHICILLIN RESISTANT STAPHYLOCOCCUS AUREUS   Report Status 04/11/2022 FINAL  Final   Organism ID, Bacteria METHICILLIN RESISTANT STAPHYLOCOCCUS AUREUS  Final      Susceptibility   Methicillin resistant staphylococcus aureus - MIC*    CIPROFLOXACIN >=8 RESISTANT Resistant     ERYTHROMYCIN >=8 RESISTANT Resistant     GENTAMICIN <=0.5 SENSITIVE Sensitive     OXACILLIN >=4 RESISTANT Resistant     TETRACYCLINE <=1 SENSITIVE Sensitive     VANCOMYCIN <=0.5 SENSITIVE Sensitive     TRIMETH/SULFA >=320 RESISTANT Resistant     CLINDAMYCIN <=0.25 SENSITIVE Sensitive     RIFAMPIN <=0.5 SENSITIVE Sensitive     Inducible Clindamycin NEGATIVE Sensitive     * MODERATE METHICILLIN RESISTANT STAPHYLOCOCCUS AUREUS  Culture, blood (Routine X 2) w  Reflex to ID Panel     Status: None   Collection Time: 04/09/22 10:39 AM   Specimen: BLOOD LEFT HAND  Result Value Ref Range Status   Specimen Description BLOOD LEFT HAND  Final   Special Requests   Final    BOTTLES DRAWN AEROBIC ONLY Blood Culture results may not be optimal due to an inadequate volume of blood received in culture bottles   Culture   Final    NO GROWTH 5 DAYS Performed at [facility], [place] [address] [place], [place] [address]    Report Status 04/14/2022 FINAL  Final  Culture, blood (Routine X 2) w Reflex to ID Panel     Status: None   Collection Time: 04/09/22 11:02 AM   Specimen: BLOOD LEFT HAND  Result Value Ref Range Status   Specimen Description BLOOD LEFT HAND  Final   Special Requests   Final    BOTTLES DRAWN AEROBIC ONLY Blood Culture adequate volume   Culture   Final    NO GROWTH 5 DAYS Performed at [facility], [place] [address], [place], [place] [address]    Report Status 04/14/2022 FINAL  Final         Radiology Studies: No results found.      Scheduled Meds:  vitamin C  250 mg Oral BID   aspirin  81 mg Oral Daily   carvedilol  6.25 mg Oral BID WC   Chlorhexidine Gluconate Cloth  6 each Topical Daily   feeding supplement  237 mL Oral TID BM   feeding supplement (OSMOLITE 1.2 CAL)  1,000 mL Per Tube J62G   folic acid  1 mg Intravenous Daily   furosemide  40 mg Intravenous Daily  heparin  5,000 Units Subcutaneous Q8H   ipratropium-albuterol  3 mL Nebulization Once   leptospermum manuka honey  1 application  Topical Daily   linezolid  600 mg Oral Q12H   mouth rinse  15 mL Mouth Rinse BID   multivitamin with minerals  1 tablet Oral Daily   pantoprazole sodium  40 mg Per Tube Daily   polyethylene glycol  17 g Oral Daily   thiamine injection  100 mg Intravenous Daily   zinc sulfate  220 mg Oral BID   Continuous Infusions:  sodium chloride Stopped (04/14/22 0305)     LOS: 7 days    Time spent: 35 minutes    Barb Merino, MD [facility] Pager [phone]

## 2022-04-15 NOTE — Progress Notes (Signed)
Physical Therapy Treatment Patient Details Name: Sherry Baker MRN: [id number] DOB: 08-Jan-1966 Today's Date: 04/15/2022   History of Present Illness 56 year old female with polysubstance abuse, hypertension who is here with acute hypoxic respiratory failure and acute toxic encephalopathy in the setting of polysubstance abuse    PT Comments    Continuing work on functional mobility and activity tolerance;  Pt quite happy at beginning of session, very glad to have the cortrack out; min assist to roll R and 2 person mod assist to push up tto sitting on the bed; Stood to RW with min physical assist (second person for safety); we initiated walking, however pt stopped and  chose to get back in bed when it was established that we are in the hospital and can't walk to her living room; Assited pt to lay down, bed alarm on, needs met; It is promising that we will likely be able to walk more next session  Recommendations for follow up therapy are one component of a multi-disciplinary discharge planning process, led by the attending physician.  Recommendations may be updated based on patient status, additional functional criteria and insurance authorization.  Follow Up Recommendations  Skilled nursing-short term rehab (<3 hours/day)     Assistance Recommended at Discharge Frequent or constant Supervision/Assistance  Patient can return home with the following     Equipment Recommendations  Rolling walker (2 wheels);BSC/3in1    Recommendations for Other Services       Precautions / Restrictions Precautions Precautions: Fall     Mobility  Bed Mobility Overal bed mobility: Needs Assistance Bed Mobility: Rolling, Sidelying to Sit Rolling: Min guard Sidelying to sit: Mod assist, +2 for safety/equipment       General bed mobility comments: Rolled well to R to get up; Mod assist to elevate trunk to sit including Rehab Tech behind to give support at the back; raised both LEs onto bet without much  difficulty    Transfers Overall transfer level: Needs assistance Equipment used: Rolling walker (2 wheels) Transfers: Sit to/from Stand Sit to Stand: Min assist, +2 safety/equipment           General transfer comment: Cues for hand placement; overall good rise; sat impulsivly back to bed twice    Ambulation/Gait Ambulation/Gait assistance: Min assist, +2 safety/equipment Gait Distance (Feet):  (a few steps near the bed) Assistive device: Rolling walker (2 wheels)         General Gait Details: Began walking towards teh door, saying she wanted to go to the living room; Reoriented pt to the hospital, and she declined to walk anymore   Marine scientist [place] (Stroke Patients Only)       Balance                                            Cognition Arousal/Alertness: Awake/alert Behavior During Therapy: Restless Overall Cognitive Status: No family/caregiver present to determine baseline cognitive functioning Area of Impairment: Attention, Following commands, Safety/judgement, Awareness, Problem solving                       Following Commands: Follows one step commands inconsistently Safety/Judgement: Decreased awareness of safety, Decreased awareness of deficits Awareness: Intellectual Problem Solving: Requires verbal cues, Requires tactile cues General Comments: Participating well until  told that she cannot walk to "the living room"        Exercises      General Comments General comments (skin integrity, edema, etc.): Session conducted on room air, and no acute distress noted      Pertinent Vitals/Pain Pain Assessment Pain Assessment: Faces Faces Pain Scale: Hurts little more Pain Location: bottom Pain Descriptors / Indicators: Grimacing (lifting bottom off bed) Pain Intervention(s): Monitored during session    Home Living                          Prior Function             PT Goals (current goals can now be found in the care plan section) Acute Rehab PT Goals Patient Stated Goal: Wants to walk to "the living room" PT Goal Formulation: With patient Time For Goal Achievement: 04/27/22 Potential to Achieve Goals: Good Progress towards PT goals: Progressing toward goals (slowly)    Frequency    Min 2X/week      PT Plan Current plan remains appropriate    Co-evaluation              AM-PAC PT "6 Clicks" Mobility   Outcome Measure  Help needed turning from your back to your side while in a flat bed without using bedrails?: A Little Help needed moving from lying on your back to sitting on the side of a flat bed without using bedrails?: Total Help needed moving to and from a bed to a chair (including a wheelchair)?: Total Help needed standing up from a chair using your arms (e.g., wheelchair or bedside chair)?: Total Help needed to walk in hospital room?: Total Help needed climbing 3-5 steps with a railing? : Total 6 Click Score: 8    End of Session   Activity Tolerance: Patient tolerated treatment well;Other (comment) (though once she understood she couldn't go to her living room, she did not want to walk) Patient left: in bed;with call bell/phone within reach;with bed alarm set Nurse Communication: Mobility status PT Visit Diagnosis: Other abnormalities of gait and mobility (R26.89);Muscle weakness (generalized) (M62.81)     Time: 5465-0354 PT Time Calculation (min) (ACUTE ONLY): 14 min  Charges:  $Gait Training: 8-22 mins                     Roney Marion, [place] [phone]    [place] 04/15/2022, 4:21 PM

## 2022-04-16 DIAGNOSIS — T402X1A Poisoning by other opioids, accidental (unintentional), initial encounter: Secondary | ICD-10-CM | POA: Diagnosis not present

## 2022-04-16 LAB — GLUCOSE, CAPILLARY
Glucose-Capillary: 102 mg/dL — ABNORMAL HIGH (ref 70–99)
Glucose-Capillary: 104 mg/dL — ABNORMAL HIGH (ref 70–99)
Glucose-Capillary: 107 mg/dL — ABNORMAL HIGH (ref 70–99)
Glucose-Capillary: 112 mg/dL — ABNORMAL HIGH (ref 70–99)
Glucose-Capillary: 115 mg/dL — ABNORMAL HIGH (ref 70–99)
Glucose-Capillary: 128 mg/dL — ABNORMAL HIGH (ref 70–99)
Glucose-Capillary: 91 mg/dL (ref 70–99)

## 2022-04-16 MED ORDER — BENZOCAINE 20 % MT AERO
INHALATION_SPRAY | Freq: Three times a day (TID) | OROMUCOSAL | Status: DC | PRN
  Filled 2022-04-16: qty 57

## 2022-04-16 MED ORDER — POLYETHYLENE GLYCOL 3350 17 G PO PACK
17.0000 g | PACK | Freq: Every day | ORAL | Status: DC | PRN
Start: 2022-04-16 — End: 2022-04-17

## 2022-04-16 MED ORDER — PANTOPRAZOLE SODIUM 40 MG PO TBEC
40.0000 mg | DELAYED_RELEASE_TABLET | Freq: Every day | ORAL | Status: DC
  Administered 2022-04-16 – 2022-04-17 (×2): 40 mg via ORAL
  Filled 2022-04-16 (×2): qty 1

## 2022-04-16 MED ORDER — THIAMINE HCL 100 MG PO TABS
100.0000 mg | ORAL_TABLET | Freq: Every day | ORAL | Status: DC
Start: 2022-04-16 — End: 2022-04-17
  Administered 2022-04-16 – 2022-04-17 (×2): 100 mg via ORAL
  Filled 2022-04-16 (×2): qty 1

## 2022-04-16 MED ORDER — FOLIC ACID 1 MG PO TABS
1.0000 mg | ORAL_TABLET | Freq: Every day | ORAL | Status: DC
  Administered 2022-04-16 – 2022-04-17 (×2): 1 mg via ORAL
  Filled 2022-04-16 (×2): qty 1

## 2022-04-16 MED ORDER — PHENOL 1.4 % MT LIQD: 1.0000 | OROMUCOSAL | Status: DC | PRN

## 2022-04-16 NOTE — Progress Notes (Signed)
PROGRESS NOTE  Sherry Baker  ZHG:[id number] DOB: [birth year] DOA: 04/08/2022 PCP: Pcp, No   Brief Narrative:  Patient is a 56 female with history of polysubstance abuse, hypertension, iron deficiency anemia, CKD stage IIIb, colon cancer status post colostomy, bipolar 1 disorder who presented initially with altered mental status.  When EMS arrived, she was altered, found to be overdosed and was given Narcan.  She was recently admitted here and was managed for Enterococcus faecalis bacteremia.  She was intubated on presentation for protection because she was unresponsive.  Extubated, remained hemodynamically stable and was transferred out of ICU. PT/OT recommending SNF on  discharge.  Waiting for bed, medically stable for discharge.  Important events:  6/6: Presented to the ED after being found on the floor at home, suspected opioid overdose.  Admitted to the ICU for naloxone infusion 6/6 overnight, required intubation due to AMS. CT head neg. Seizure like activity ? Withdrawal. EEG negative. 6/8 agitation with sedation wean, HR up to 160s sustained. Wean stopped. 6/9 became completely unresponsive, ABG did not show CO2 retention, CT head was negative for acute findings 6/10 extubated 6/12 transferred out of ICU with NG tube feeding and nasal cannula oxygen.  Assessment & Plan:  Principal Problem:   Opioid overdose ([place]) Active Problems:   Acute respiratory failure with hypoxia (HCC)   Pressure injury of skin   Acidosis  Acute metabolic encephalopathy /opiate overdose: Unresponsive on arrival.  Suspected to be from opiate overdose.  She was hypothermic, bradycardic and responded naloxone.  UDS positive for benzo, cocaine.  Currently alert and oriented.  Acute hypoxic respiratory failure: Had to be intubated for airway protection and was admitted under ICU.  Currently respiratory status stable.  On room air Hospital course also remarkable for MRSA pneumonia.  Currently on linezolid, plan  for 7 days course  Dysphagia: Was on NG tube feeding because of encephalopathy.  Mental status improved so NG tube discontinued.  On dysphagia 3 diet.  Speech therapy following  AKI in CKD stage IIIb/hyperkalemia: Improved.  Currently kidney function at baseline, baseline creatinine around 1.3.  Hypertension: Currently on Coreg.  Monitor blood pressure  Anxiety/depression: Continue supportive care.  Currently not on any medication  Hypophosphatemia: Replaced and corrected  Severe protein calorie deficient: Nutritionist following.  Encourage oral intake  Debility/deconditioning: PT/OT recommended skilled nursing facilty on discharge.  Social worker following.  She is hesitant about going to skilled nursing facility and wants to go home but she said she will think about it.      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 (Active)  03/06/22 1900  Location: Sacrum  Location Orientation:   Staging: Stage 4 - Full thickness tissue loss with exposed bone, tendon or muscle.  Wound Description (Comments):   Present on Admission: Yes  Dressing Type Foam - Lift dressing to assess site every shift 04/16/22 0800     Pressure Injury 04/09/22 Buttocks Right Stage 2 -  Partial thickness loss of dermis presenting as a shallow open injury with a red, pink wound bed without slough. pink, red (Active)  04/09/22 0415  Location: Buttocks  Location Orientation: Right  Staging: Stage 2 -  Partial thickness loss of dermis presenting as a shallow open injury with a red, pink wound bed without slough.  Wound Description (Comments): pink, red  Present on Admission: Yes  Dressing Type Foam -  Lift dressing to assess site every shift 04/16/22 0800     Pressure Injury 04/09/22 Buttocks Left Stage 2 -  Partial thickness loss of dermis  presenting as a shallow open injury with a red, pink wound bed without slough. pink, red (Active)  04/09/22 0415  Location: Buttocks  Location Orientation: Left  Staging: Stage 2 -  Partial thickness loss of dermis presenting as a shallow open injury with a red, pink wound bed without slough.  Wound Description (Comments): pink, red  Present on Admission: Yes  Dressing Type Foam - Lift dressing to assess site every shift 04/16/22 0800     Pressure Injury 04/13/22 Buttocks Right Deep Tissue Pressure Injury - Purple or maroon localized area of discolored intact skin or blood-filled blister due to damage of underlying soft tissue from pressure and/or shear. (Active)  04/13/22 2042  Location: Buttocks  Location Orientation: Right  Staging: Deep Tissue Pressure Injury - Purple or maroon localized area of discolored intact skin or blood-filled blister due to damage of underlying soft tissue from pressure and/or shear.  Wound Description (Comments):   Present on Admission:   Dressing Type Foam - Lift dressing to assess site every shift 04/16/22 0800     Pressure Injury 04/13/22 Toe (Comment  which one) Anterior;Right Deep Tissue Pressure Injury - Purple or maroon localized area of discolored intact skin or blood-filled blister due to damage of underlying soft tissue from pressure and/or shear. (Active)  04/13/22 2042  Location: Toe (Comment  which one)  Location Orientation: Anterior;Right  Staging: Deep Tissue Pressure Injury - Purple or maroon localized area of discolored intact skin or blood-filled blister due to damage of underlying soft tissue from pressure and/or shear.  Wound Description (Comments):   Present on Admission:   Dressing Type Foam - Lift dressing to assess site every shift 04/16/22 0800    DVT prophylaxis:heparin injection 5,000 Units Start: 04/08/22 1515     Code Status: Full Code  Family Communication: None at bedside  Patient status:Inpatient  Patient is from  :Home  Anticipated discharge to:SNF vs Home  Estimated DC date:1-2 days.  Waiting for bed   Consultants: PCCM  Procedures: Intubation  Antimicrobials:  Anti-infectives (From admission, onward)    Start     Dose/Rate Route Frequency Ordered Stop   04/15/22 1000  linezolid (ZYVOX) tablet 600 mg        600 mg Oral Every 12 hours 04/15/22 0909 04/17/22 0959   04/11/22 1045  linezolid (ZYVOX) IVPB 600 mg  Status:  Discontinued        600 mg 300 mL/hr over 60 Minutes Intravenous Every 12 hours 04/11/22 0959 04/15/22 4098       Subjective:  Patient seen and examined at the bedside this afternoon.  Hemodynamically stable.  Comfortable, lying on bed.  On room air, without any complaints.  Eager to go home.  We discussed about importance of considering skilled nursing facility on discharge due to her deconditioning/debility Objective: Vitals:   04/15/22 2015 04/16/22 0423 04/16/22 0830 04/16/22 1020  BP: 114/71 95/65 110/80 115/63  Pulse: 77 97 84 86  Resp: '18 18 16 17  '$ Temp: 98.2 F (36.8 C) 98.6 F (37 C)  98.1 F (36.7 C)  TempSrc:      SpO2: 100% 99% 98% 97%  Weight:      Height:        Intake/Output Summary (Last 24 hours) at 04/16/2022 1253 Last data filed at 04/16/2022 0800 Gross per 24 hour  Intake 617 ml  Output 1925 ml  Net -1308 ml   Filed Weights   04/11/22 0500 04/12/22 0500 04/13/22 2037  Weight: 46.8 kg 46.5 kg 42.9 kg    Examination:  General exam: Overall comfortable, not in distress, chronically ill looking, weak HEENT: PERRL Respiratory system:  no wheezes or crackles  Cardiovascular system: S1 & S2 heard, RRR.  Gastrointestinal system: Abdomen is nondistended, soft and nontender. Central nervous system: Alert and oriented Extremities: No edema, no clubbing ,no cyanosis Skin: No rashes, no ulcers,no icterus     Data Reviewed: I have personally reviewed following labs and imaging studies  CBC: Recent Labs  Lab 04/10/22 0324 04/11/22 0118  04/11/22 1135 04/12/22 0627 04/13/22 0032 04/15/22 0436  WBC 6.3 12.1*  --  8.1 7.3 9.8  NEUTROABS  --   --   --   --   --  7.5  HGB 10.8* 11.4* 10.2* 8.6* 9.0* 11.0*  HCT 34.8* 36.6 30.0* 27.2* 28.3* 33.8*  MCV 90.2 88.8  --  88.0 87.1 84.3  PLT 232 224  --  166 182 481   Basic Metabolic Panel: Recent Labs  Lab 04/10/22 1658 04/11/22 0118 04/11/22 1135 04/11/22 1621 04/12/22 0627 04/13/22 0032 04/14/22 0304 04/15/22 0436  NA  --  141 141  --  140 138 138 133*  K  --  5.6* 4.5  --  3.8 2.8* 4.2 4.5  CL  --  110  --   --  105 98 100 98  CO2  --  20*  --   --  '24 29 27 24  '$ GLUCOSE  --  142*  --   --  126* 128* 122* 119*  BUN  --  29*  --   --  42* 39* 36* 32*  CREATININE  --  2.03*  --   --  2.22* 1.97* 1.66* 1.48*  CALCIUM  --  8.0*  --   --  7.5* 8.0* 8.6* 8.6*  MG 2.7* 2.7*  --  2.4  --   --  1.9 1.9  PHOS 4.7* 4.6  --  4.4  --   --  1.8* 3.0     Recent Results (from the past 240 hour(s))  MRSA Next Gen by PCR, Nasal     Status: Abnormal   Collection Time: 04/08/22  7:40 PM   Specimen: Nasal Mucosa; Nasal Swab  Result Value Ref Range Status   MRSA by PCR Next Gen DETECTED (A) NOT DETECTED Final    Comment: RESULT CALLED TO, READ BACK BY AND VERIFIED WITH: RN SARAH CIENER 04/08/22'@22'$ :25 BY TW (NOTE) The GeneXpert MRSA Assay (FDA approved for NASAL specimens only), is one component of a comprehensive MRSA colonization surveillance program. It is not intended to diagnose MRSA infection nor to guide or monitor treatment for MRSA infections. Test performance is not FDA approved in patients less than 25 years old. Performed at [facility], [place] [address], [place], [place] [address]   Culture, Respiratory w Gram Stain     Status: None   Collection Time: 04/08/22 11:51 PM   Specimen: Tracheal Aspirate; Respiratory  Result Value Ref Range Status   Specimen Description TRACHEAL ASPIRATE  Final   Special Requests NONE  Final   Gram Stain   Final    NO SQUAMOUS  EPITHELIAL CELLS SEEN FEW WBC SEEN FEW GRAM POSITIVE RODS MODERATE GRAM POSITIVE COCCI Performed at [facility], [place] [address], [place], [place] [address]  Culture   Final    MODERATE METHICILLIN RESISTANT STAPHYLOCOCCUS AUREUS   Report Status 04/11/2022 FINAL  Final   Organism ID, Bacteria METHICILLIN RESISTANT STAPHYLOCOCCUS AUREUS  Final      Susceptibility   Methicillin resistant staphylococcus aureus - MIC*    CIPROFLOXACIN >=8 RESISTANT Resistant     ERYTHROMYCIN >=8 RESISTANT Resistant     GENTAMICIN <=0.5 SENSITIVE Sensitive     OXACILLIN >=4 RESISTANT Resistant     TETRACYCLINE <=1 SENSITIVE Sensitive     VANCOMYCIN <=0.5 SENSITIVE Sensitive     TRIMETH/SULFA >=320 RESISTANT Resistant     CLINDAMYCIN <=0.25 SENSITIVE Sensitive     RIFAMPIN <=0.5 SENSITIVE Sensitive     Inducible Clindamycin NEGATIVE Sensitive     * MODERATE METHICILLIN RESISTANT STAPHYLOCOCCUS AUREUS  Culture, blood (Routine X 2) w Reflex to ID Panel     Status: None   Collection Time: 04/09/22 10:39 AM   Specimen: BLOOD LEFT HAND  Result Value Ref Range Status   Specimen Description BLOOD LEFT HAND  Final   Special Requests   Final    BOTTLES DRAWN AEROBIC ONLY Blood Culture results may not be optimal due to an inadequate volume of blood received in culture bottles   Culture   Final    NO GROWTH 5 DAYS Performed at [facility], [address] [place], [place] [address]    Report Status 04/14/2022 FINAL  Final  Culture, blood (Routine X 2) w Reflex to ID Panel     Status: None   Collection Time: 04/09/22 11:02 AM   Specimen: BLOOD LEFT HAND  Result Value Ref Range Status   Specimen Description BLOOD LEFT HAND  Final   Special Requests   Final    BOTTLES DRAWN AEROBIC ONLY Blood Culture adequate volume   Culture   Final    NO GROWTH 5 DAYS Performed at [facility], [place] [address] [place], [place] [address]    Report Status 04/14/2022 FINAL  Final     Radiology  Studies: No results found.  Scheduled Meds:  vitamin C  250 mg Oral BID   aspirin  81 mg Oral Daily   carvedilol  6.25 mg Oral BID WC   Chlorhexidine Gluconate Cloth  6 each Topical Daily   feeding supplement  237 mL Oral TID BM   folic acid  1 mg Intravenous Daily   furosemide  40 mg Intravenous Daily   heparin  5,000 Units Subcutaneous Q8H   ipratropium-albuterol  3 mL Nebulization Once   leptospermum manuka honey  1 application  Topical Daily   linezolid  600 mg Oral Q12H   mouth rinse  15 mL Mouth Rinse BID   multivitamin with minerals  1 tablet Oral Daily   pantoprazole sodium  40 mg Per Tube Daily   polyethylene glycol  17 g Oral Daily   thiamine injection  100 mg Intravenous Daily   zinc sulfate  220 mg Oral BID   Continuous Infusions:  sodium chloride Stopped (04/14/22 0305)     LOS: 8 days   Shelly Coss, MD [facility] P6/14/2023, 12:53 PM

## 2022-04-16 NOTE — Consult Note (Signed)
[place] Nurse wound re consult: Refer to previous [place] consult on 6/7.  Pt was noted to have a chronic Stage 4 pressure injury which was present on admission. Pt is very emaciated with multiple systemic factors which can impair healing.  Now has a dark red-purple Deep tissue pressure injury to right buttock; 3X1cm  Upper Sacrum now with Stage 3 pressure injury; 4X3X.8cm, 50% red, 50% yellow, mod amt tan drainage.  Present on admission: No Dressing procedure/placement/frequency: Topical treatment orders provided for bedside nurses to perform as follows to assist with removal of nonviable tissue: Apply to upper and lower sacral wound Q day, then cover with gauze and foam dressings.  (Change foam dressing Q 3 days or PRN soiling.) WOC team will reassess weekly to determine if a change in the plan of care is indicated at that time.  Julien Girt MSN, RN, [place], [place], [place]

## 2022-04-17 ENCOUNTER — Other Ambulatory Visit (HOSPITAL_COMMUNITY): Payer: Self-pay

## 2022-04-17 DIAGNOSIS — T402X1A Poisoning by other opioids, accidental (unintentional), initial encounter: Secondary | ICD-10-CM | POA: Diagnosis not present

## 2022-04-17 LAB — BASIC METABOLIC PANEL
Anion gap: 12 (ref 5–15)
BUN: 47 mg/dL — ABNORMAL HIGH (ref 6–20)
CO2: 20 mmol/L — ABNORMAL LOW (ref 22–32)
Calcium: 8.5 mg/dL — ABNORMAL LOW (ref 8.9–10.3)
Chloride: 99 mmol/L (ref 98–111)
Creatinine, Ser: 1.64 mg/dL — ABNORMAL HIGH (ref 0.44–1.00)
GFR, Estimated: 37 mL/min — ABNORMAL LOW (ref >60)
Glucose, Bld: 105 mg/dL — ABNORMAL HIGH (ref 70–99)
Potassium: 4.1 mmol/L (ref 3.5–5.1)
Sodium: 131 mmol/L — ABNORMAL LOW (ref 135–145)

## 2022-04-17 LAB — GLUCOSE, CAPILLARY
Glucose-Capillary: 117 mg/dL — ABNORMAL HIGH (ref 70–99)
Glucose-Capillary: 124 mg/dL — ABNORMAL HIGH (ref 70–99)

## 2022-04-17 LAB — MAGNESIUM: Magnesium: 2.1 mg/dL (ref 1.7–2.4)

## 2022-04-17 MED ORDER — CARVEDILOL 6.25 MG PO TABS
6.2500 mg | ORAL_TABLET | Freq: Two times a day (BID) | ORAL | 0 refills | Status: DC
  Filled 2022-04-17: qty 60, 30d supply, fill #0

## 2022-04-17 MED ORDER — FOLIC ACID 1 MG PO TABS
1.0000 mg | ORAL_TABLET | Freq: Every day | ORAL | 0 refills | Status: DC
  Filled 2022-04-17: qty 30, 30d supply, fill #0

## 2022-04-17 MED ORDER — THIAMINE HCL 100 MG PO TABS
100.0000 mg | ORAL_TABLET | Freq: Every day | ORAL | 0 refills | Status: DC
  Filled 2022-04-17: qty 30, 30d supply, fill #0

## 2022-04-17 MED ORDER — UMECLIDINIUM-VILANTEROL 62.5-25 MCG/ACT IN AEPB
1.0000 | INHALATION_SPRAY | Freq: Every day | RESPIRATORY_TRACT | 0 refills | Status: DC
Start: 2022-04-17 — End: 2022-11-08

## 2022-04-17 MED ORDER — BENZOCAINE 20 % MT AERO
1.0000 | INHALATION_SPRAY | Freq: Three times a day (TID) | OROMUCOSAL | 0 refills | Status: DC | PRN
  Filled 2022-04-17: qty 57, 4d supply, fill #0

## 2022-04-17 MED ORDER — ALBUTEROL SULFATE HFA 108 (90 BASE) MCG/ACT IN AERS
2.0000 | INHALATION_SPRAY | Freq: Four times a day (QID) | RESPIRATORY_TRACT | 0 refills | Status: DC | PRN
  Filled 2022-04-17: qty 18, 25d supply, fill #0

## 2022-04-17 MED ORDER — BUSPIRONE HCL 15 MG PO TABS
15.0000 mg | ORAL_TABLET | Freq: Two times a day (BID) | ORAL | 0 refills | Status: DC
  Filled 2022-04-17: qty 60, 30d supply, fill #0

## 2022-04-17 NOTE — Progress Notes (Signed)
Patient had a 4 beat nonsustained VTach. VSS. BP 117/73 HR 92 T 99.1 RR 17 O2 sat 99% on RA. Pt denies any c/o. [place],MD notified via secure chat. New orders received. Will continue to monitor.

## 2022-04-17 NOTE — TOC Progression Note (Signed)
Transition of Care [facility]) - Initial/Assessment Note    Patient Details  Name: Sherry Baker MRN: [id number] Date of Birth: Mar 09, 1966  Transition of Care [facility]) CM/SW Contact:    Milinda Antis, [place] Phone Number: 04/17/2022, 9:02 AM  Clinical Narrative:                 CSW met with the patient at bedside after being informed by MD that patient was contemplating going to a SNF.  The patient reported that she wanted to go home.  Dr. Tawanna Solo walked into the room during this time and the patient reiterated that she wanted to go home.  The patient reported that she has a friend who lives near the hospital who can come get her.     Expected Discharge Plan: Skilled Nursing Facility Barriers to Discharge: Continued Medical Work up   Patient Goals and CMS Choice Patient states their goals for this hospitalization and ongoing recovery are:: To go home CMS Medicare.gov Compare Post Acute Care list provided to:: Patient Choice offered to / list presented to : Patient  Expected Discharge Plan and Services Expected Discharge Plan: [place] arrangements for the past 2 months: Apartment                                      Prior Living Arrangements/Services Living arrangements for the past 2 months: Apartment Lives with:: Roommate Patient language and need for interpreter reviewed:: Yes Do you feel safe going back to the place where you live?: Yes      Need for Family Participation in Patient Care: Yes (Comment) Care giver support system in place?: No (comment)   Criminal Activity/Legal Involvement Pertinent to Current Situation/Hospitalization: No - Comment as needed  Activities of Daily Living      Permission Sought/Granted                  Emotional Assessment Appearance:: Appears older than stated age Attitude/Demeanor/Rapport: Inconsistent Affect (typically observed): Stable Orientation: : Oriented to Self, Oriented to  Place Alcohol / Substance Use: Illicit Drugs Psych Involvement: No (comment)  Admission diagnosis:  Polysubstance abuse ([place]) [F19.10] Opioid overdose ([place]) [T40.2X1A] Injury of head, initial encounter [S09.90XA] Altered mental status, unspecified altered mental status type [R41.82] Pressure injury of skin of sacral region, unspecified injury stage [L89.159] Overdose of undetermined intent, initial encounter [T50.904A] Patient Active Problem List   Diagnosis Date Noted   Acidosis    Opioid overdose ([place]) 04/08/2022   Pressure injury of skin 03/11/2022   Protein-calorie malnutrition, severe 03/10/2022   UTI (urinary tract infection) 03/06/2022   Hypoglycemia 03/06/2022   Hypernatremia 03/06/2022   Cocaine abuse ([place]) 03/06/2022   High anion gap metabolic acidosis 97/41/6384   Elevated serum creatinine 03/06/2022   Hypercalcemia 03/06/2022   AMS (altered mental status) 03/06/2022   Acquired hypothyroidism    HTN (hypertension)    Endotracheally intubated    Acute encephalopathy 03/05/2022   Protein-calorie malnutrition, severe 02/04/2022   Pressure injury of skin 02/03/2022   Acute encephalopathy 02/02/2022   Chronic Sacral wound, subsequent encounter 12/13/2021   Right hip pain causing ambulatory dysfunction 11/26/2021   Cellulitis of left hand 11/25/2021   Hyponatremia 11/25/2021   CKD (chronic kidney disease), stage III ([place]) 11/25/2021   Colostomy in place [facility]) 11/25/2021   MRSA bacteremia w/ associated pustular rash 11/25/2021  Alcohol use disorder, severe, dependence ([place])    Acute respiratory failure with hypoxia ([place])    Physical deconditioning    Ambulatory dysfunction    Underweight 06/25/2021   Protein-calorie malnutrition, severe 06/25/2021   Homelessness 40/98/1191   Alcoholic cirrhosis of liver without ascites ([place]) 06/23/2021   Alcohol abuse 06/14/2019   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   Normochromic normocytic anemia 06/06/2019   Pressure  injury of skin 06/06/2019   Substance abuse ([place])    Seizure ([place]) 05/21/2019   Polysubstance abuse with associated depression and anxiety 05/18/2019   Genetic testing 03/24/2017   Hypertension 02/18/2017   GERD (gastroesophageal reflux disease) 02/18/2017   Rectal cancer ([place]) 01/27/2017   Rectal mass    Anemia 01/16/2017   Constipation    Alcohol abuse    Tobacco abuse    Cocaine abuse ([place])    Bipolar affective disorder ([place])    Severe malnutrition ([place]) 02/13/2014   Substance abuse ([place]) 02/11/2014   Depression 02/11/2014   Thrombocytopenia ([place]) 02/11/2014   PCP:  Merryl Hacker, No Pharmacy:   [id number] [address], [place] [address] Phone: [phone] Fax: [phone]  [facility] [address] [address] Phone: [phone] Fax: [phone]     Social Determinants of Health (SDOH) Interventions    Readmission Risk Interventions    12/10/2021    1:58 PM 11/15/2021    5:02 PM 09/30/2021    9:37 AM  Readmission Risk Prevention Plan  Transportation Screening Complete Complete Complete  Medication Review Press photographer) Complete Complete Complete  PCP or Specialist appointment within 3-5 days of discharge Complete Complete Complete  HRI or Home Care Consult Not Complete  Not Complete  HRI or Home Care Consult Pt Refusal Comments history of unsafe home environment    SW Recovery Care/Counseling Consult Complete  Complete  Palliative Care Screening Not Applicable Not Applicable Not [place] Not Applicable Not Applicable Not Applicable

## 2022-04-17 NOTE — Discharge Summary (Signed)
Physician Discharge Summary  Sherry Baker EVO:[id number] DOB: 1966-01-04 DOA: 04/08/2022  PCP: Merryl Hacker, No  Admit date: 04/08/2022 Discharge date: 04/17/2022  Admitted From: Home Disposition:  Home  Discharge Condition:Stable CODE STATUS:FULL, DNR, Comfort Care Diet recommendation: Heart Healthy / Carb Modified / Regular / Dysphagia   Brief/Interim Summary:  Patient is a 84 female with history of polysubstance abuse, hypertension, iron deficiency anemia, CKD stage IIIb, colon cancer status post colostomy, bipolar 1 disorder who presented initially with altered mental status.  When EMS arrived, she was altered, found to be overdosed and was given Narcan.  She was recently admitted here and was managed for Enterococcus faecalis bacteremia.  She was intubated on presentation for protection because she was unresponsive.  Extubated, remained hemodynamically stable and was transferred out of ICU. PT/OT recommending SNF on  discharge.  Patient adamantly denies discharge to skilled nursing facility and wants to go home.  Currently she is alert and oriented to make a decision.  Medically stable for discharge.  Following problems were addressed during her hospitalization:  Acute metabolic encephalopathy /opiate overdose: Unresponsive on arrival.  Suspected to be from opiate overdose.  She was hypothermic, bradycardic and responded naloxone.  UDS positive for benzo, cocaine.  Currently alert and oriented.   Acute hypoxic respiratory failure: Had to be intubated for airway protection and was admitted under ICU.  Currently respiratory status stable.  On room air Hospital course also remarkable for MRSA pneumonia. Treated with ,linezolid, completed course   Dysphagia: Was on NG tube feeding because of encephalopathy.  Mental status improved so NG tube discontinued.  On dysphagia 3 diet.   AKI in CKD stage IIIb/hyperkalemia: Improved.  Currently kidney function at baseline, baseline creatinine around 1.5 -2.    Hypertension: Currently on Coreg.  Monitor blood pressure   Anxiety/depression: Continue supportive care.  On buspirone  Hypophosphatemia: Replaced and corrected   Severe protein calorie deficient: Nutritionist was following.  Encourage oral intake   Debility/deconditioning: PT/OT recommended skilled nursing facilty on discharge.  Social worker following.  She is hesitant about going to skilled nursing facility and wants to go home , declined skilled facility.     Discharge Diagnoses:  Principal Problem:   Opioid overdose ([place]) Active Problems:   Acute respiratory failure with hypoxia ([facility])   Pressure injury of skin   Acidosis    Discharge Instructions  Discharge Instructions     Diet general   Complete by: As directed    Dysphagia 3 diet   Discharge instructions   Complete by: As directed    1)Please take prescribed medications as instructed 2)Please stop substance abuse/recreational drugs 3)Follow up with your PCP in a week   Discharge wound care:   Complete by: As directed    As per wound care   Increase activity slowly   Complete by: As directed       Allergies as of 04/17/2022   No Known Allergies      Medication List     STOP taking these medications    amLODipine 5 MG tablet Commonly known as: NORVASC   CertaVite/Antioxidants Tabs   citalopram 10 MG tablet Commonly known as: CELEXA   cloNIDine 0.1 MG tablet Commonly known as: CATAPRES   diclofenac Sodium 1 % Gel Commonly known as: VOLTAREN   hydrOXYzine 10 MG tablet Commonly known as: ATARAX   Iron (Ferrous Sulfate) 325 (65 Fe) MG Tabs   lidocaine 5 % Commonly known as: LIDODERM   methocarbamol 500 MG tablet  Commonly known as: ROBAXIN   oxyCODONE 5 MG immediate release tablet Commonly known as: Oxy IR/ROXICODONE   pantoprazole 40 MG tablet Commonly known as: Protonix   pregabalin 25 MG capsule Commonly known as: LYRICA   Spiriva HandiHaler 18 MCG inhalation capsule Generic  drug: tiotropium   Symbicort 160-4.5 MCG/ACT inhaler Generic drug: budesonide-formoterol   Vitamin D3 25 MCG tablet Commonly known as: Vitamin D       TAKE these medications    acetaminophen 325 MG tablet Commonly known as: TYLENOL Take 2 tablets (650 mg total) by mouth every 12 (twelve) hours.   albuterol 108 (90 Base) MCG/ACT inhaler Commonly known as: VENTOLIN HFA Inhale 2 puffs into the lungs every 6 (six) hours as needed for wheezing or shortness of breath. What changed: Another medication with the same name was removed. Continue taking this medication, and follow the directions you see here.   Benzocaine 20 % Aero Commonly known as: HURRCAINE Use as directed 1 Application in the mouth or throat 3 (three) times daily as needed for mouth pain.   busPIRone 15 MG tablet Commonly known as: BUSPAR Take 1 tablet (15 mg total) by mouth 2 (two) times daily.   carvedilol 6.25 MG tablet Commonly known as: COREG Take 1 tablet (6.25 mg total) by mouth 2 (two) times daily with a meal.   feeding supplement Liqd Take 237 mLs by mouth 3 (three) times daily between meals.   folic acid 1 MG tablet Commonly known as: FOLVITE Take 1 tablet (1 mg total) by mouth daily.   thiamine 100 MG tablet Take 1 tablet (100 mg total) by mouth daily.   traMADol 50 MG tablet Commonly known as: ULTRAM Take 1 tablet (50 mg total) by mouth every 6 (six) hours as needed for moderate pain.   umeclidinium-vilanterol 62.5-25 MCG/ACT Aepb Commonly known as: ANORO ELLIPTA Inhale 1 puff into the lungs daily.               Discharge Care Instructions  (From admission, onward)           Start     Ordered   04/17/22 0000  Discharge wound care:       Comments: As per wound care   04/17/22 1102            No Known Allergies  Consultations: PCCM   Procedures/Studies: CT HEAD WO CONTRAST (5MM)  Result Date: 04/11/2022 CLINICAL DATA:  Altered mental status. EXAM: CT HEAD WITHOUT  CONTRAST TECHNIQUE: Contiguous axial images were obtained from the base of the skull through the vertex without intravenous contrast. RADIATION DOSE REDUCTION: This exam was performed according to the departmental dose-optimization program which includes automated exposure control, adjustment of the mA and/or kV according to patient size and/or use of iterative reconstruction technique. COMPARISON:  04/08/2022 FINDINGS: Brain: There is no evidence for acute hemorrhage, hydrocephalus, mass lesion, or abnormal extra-axial fluid collection. No definite CT evidence for acute infarction. Vascular: Choose 1 Skull: No evidence for fracture. No worrisome lytic or sclerotic lesion. Sinuses/Orbits: Air-fluid level again noted right maxillary sinus, nonspecific in the setting of nasal cannula. Visualized portions of the globes and intraorbital fat are unremarkable. Other: Left frontotemporal scalp hematoma again noted. IMPRESSION: 1. Stable.  No acute intracranial abnormality. 2. Left frontotemporal scalp hematoma. Electronically Signed   By: Misty Stanley M.D.   On: 04/11/2022 17:47   DG CHEST PORT 1 VIEW  Result Date: 04/10/2022 CLINICAL DATA:  Intubation EXAM: PORTABLE CHEST 1 VIEW COMPARISON:  04/08/2022 FINDINGS: Esophagogastric tube has been exchanged for an enteric feeding tube, tip not visualized but below the diaphragm. Otherwise no significant change in AP portable chest radiograph, endotracheal tube remains in position over the mid trachea. Probable left basilar atelectasis. IMPRESSION: 1. Esophagogastric tube has been exchanged for an enteric feeding tube, tip not visualized but below the diaphragm. 2.  Endotracheal tube remains in position over the mid trachea. 3.  Probable left basilar atelectasis. Electronically Signed   By: Delanna Ahmadi M.D.   On: 04/10/2022 08:22   DG Abd Portable 1V  Result Date: 04/09/2022 CLINICAL DATA:  Feeding tube placement. EXAM: PORTABLE ABDOMEN - 1 VIEW COMPARISON:  04/08/2022.  FINDINGS: Feeding tube terminates in the duodenal bulb. Visualized lungs are clear. IMPRESSION: Feeding tube tip terminates in the duodenal bulb. Electronically Signed   By: Lorin Picket M.D.   On: 04/09/2022 14:44   EEG adult  Result Date: 04/09/2022 Greta Doom, MD     04/09/2022  7:48 AM History: 56 yo F s/p overdose with AMS Sedation: fentanyl Technique: This EEG was acquired with electrodes placed according to the International 10-20 electrode system (including Fp1, Fp2, F3, F4, C3, C4, P3, P4, O1, O2, T3, T4, T5, T6, A1, A2, Fz, Cz, Pz). The following electrodes were missing or displaced: none. Background: The background consists of a burst suppression pattern with bursts consisting of 1-2 seconds of benign appearing intermixed delta and theta activity with an inter burst interval of 2 - 5 seconds. Photic stimulation: Physiologic driving is none EEG Abnormalities: 1) Burst-suppression pattern Clinical Interpretation: This EEG recorded evidence of a profound cerebral dysfunction as can be seen in medication induced coma, post-anoxic injury, or less commonly in severe metabolic derangements. In the absence of a clear period of cardiac arrest, would favor medication-induced which could be explained if the patient took other agents in addition to narcotics. There was no seizure or seizure predisposition recorded on this study. Please note that lack of epileptiform activity on EEG does not preclude the possibility of epilepsy. Roland Rack, MD [facility] [phone] If 7pm- 7am, please page neurology on call as listed in [place].   CT HEAD WO CONTRAST (5MM)  Result Date: 04/08/2022 CLINICAL DATA:  Altered mental status EXAM: CT HEAD WITHOUT CONTRAST TECHNIQUE: Contiguous axial images were obtained from the base of the skull through the vertex without intravenous contrast. RADIATION DOSE REDUCTION: This exam was performed according to the departmental dose-optimization program  which includes automated exposure control, adjustment of the mA and/or kV according to patient size and/or use of iterative reconstruction technique. COMPARISON:  CT from earlier in the same day. FINDINGS: Brain: No evidence of acute infarction, hemorrhage, hydrocephalus, extra-axial collection or mass lesion/mass effect. Vascular: No hyperdense vessel or unexpected calcification. Skull: Normal. Negative for fracture or focal lesion. Sinuses/Orbits: Orbits and their contents are within normal limits. Persistent mucosal thickening is noted within the right maxillary antrum. Other: Left frontal scalp hematoma is again seen and stable. IMPRESSION: Stable left frontal scalp hematoma and mucosal thickening within the right maxillary antrum. No acute intracranial abnormality is noted. Electronically Signed   By: Inez Catalina M.D.   On: 04/08/2022 23:47   Portable Chest x-ray  Result Date: 04/08/2022 CLINICAL DATA:  OG and endotracheal tube placement. EXAM: PORTABLE CHEST 1 VIEW, ABDOMEN ONE VIEW COMPARISON:  10/30/2021, 04/08/2022. FINDINGS: The heart size and mediastinal contours are within normal limits. Minimal atelectasis is noted at the lung bases. No consolidation, effusion, or pneumothorax. The  distal tip the endotracheal tube terminates 3.9 cm above the carina. No acute osseous abnormality. There is a nonobstructive bowel-gas pattern. Vascular calcifications are noted in the abdomen. An enteric tube terminates in the stomach. IMPRESSION: 1. Minimal atelectasis at the lung bases. 2. Nonobstructive bowel-gas pattern. 3. Endotracheal and enteric tubes as described above. Electronically Signed   By: Brett Fairy M.D.   On: 04/08/2022 20:53   DG Abd Portable 1V  Result Date: 04/08/2022 CLINICAL DATA:  OG and endotracheal tube placement. EXAM: PORTABLE CHEST 1 VIEW, ABDOMEN ONE VIEW COMPARISON:  10/30/2021, 04/08/2022. FINDINGS: The heart size and mediastinal contours are within normal limits. Minimal atelectasis  is noted at the lung bases. No consolidation, effusion, or pneumothorax. The distal tip the endotracheal tube terminates 3.9 cm above the carina. No acute osseous abnormality. There is a nonobstructive bowel-gas pattern. Vascular calcifications are noted in the abdomen. An enteric tube terminates in the stomach. IMPRESSION: 1. Minimal atelectasis at the lung bases. 2. Nonobstructive bowel-gas pattern. 3. Endotracheal and enteric tubes as described above. Electronically Signed   By: Brett Fairy M.D.   On: 04/08/2022 20:53   CT Maxillofacial Wo Contrast  Result Date: 04/08/2022 CLINICAL DATA:  Trauma EXAM: CT MAXILLOFACIAL WITHOUT CONTRAST TECHNIQUE: Multidetector CT imaging of the maxillofacial structures was performed. Multiplanar CT image reconstructions were also generated. RADIATION DOSE REDUCTION: This exam was performed according to the departmental dose-optimization program which includes automated exposure control, adjustment of the mA and/or kV according to patient size and/or use of iterative reconstruction technique. COMPARISON:  02/02/2022 FINDINGS: Osseous: Minimal deformity in the nasal bones as not changed significantly. No definite recent fracture is seen. Orbits: Optic globes are symmetrical. Retrobulbar soft tissues are unremarkable. Sinuses: There is mucosal thickening and air-fluid level in the right maxillary sinus with no significant interval change. Soft tissues: Unremarkable. Limited intracranial: Unremarkable. IMPRESSION: No recent fracture is seen in the facial bones. Chronic right maxillary sinusitis. Electronically Signed   By: Elmer Picker M.D.   On: 04/08/2022 13:43   CT Cervical Spine Wo Contrast  Result Date: 04/08/2022 CLINICAL DATA:  Trauma EXAM: CT CERVICAL SPINE WITHOUT CONTRAST TECHNIQUE: Multidetector CT imaging of the cervical spine was performed without intravenous contrast. Multiplanar CT image reconstructions were also generated. RADIATION DOSE REDUCTION: This  exam was performed according to the departmental dose-optimization program which includes automated exposure control, adjustment of the mA and/or kV according to patient size and/or use of iterative reconstruction technique. COMPARISON:  03/05/2022 FINDINGS: Alignment: There is straightening of lordosis. Skull base and vertebrae: No recent fracture is seen. Degenerative changes are noted with bony spurs from C4 to C7 levels. Soft tissues and spinal canal: There is extrinsic pressure over the ventral margin of thecal sac caused by posterior bony spurs at C5-C6 level. Disc levels: There is encroachment of neural foramina from C4-C7 levels. Upper chest: Unremarkable. Other: There are coarse arterial calcifications at carotid bifurcations. IMPRESSION: No recent fracture is seen in the cervical spine. Cervical spondylosis with encroachment of neural foramina at multiple levels. Electronically Signed   By: Elmer Picker M.D.   On: 04/08/2022 13:40   CT Head Wo Contrast  Result Date: 04/08/2022 CLINICAL DATA:  Trauma, altered mental status EXAM: CT HEAD WITHOUT CONTRAST TECHNIQUE: Contiguous axial images were obtained from the base of the skull through the vertex without intravenous contrast. RADIATION DOSE REDUCTION: This exam was performed according to the departmental dose-optimization program which includes automated exposure control, adjustment of the mA and/or kV according  to patient size and/or use of iterative reconstruction technique. COMPARISON:  02/02/2022 FINDINGS: Brain: No acute intracranial findings are seen in noncontrast CT brain. There are no signs of bleeding within the cranium. Vascular: Unremarkable. Skull: No fracture is seen in the calvarium. There is subcutaneous hematoma in the left frontal scalp. Sinuses/Orbits: There is partial opacification of right maxillary sinus with no significant interval change. Other: None. IMPRESSION: No acute intracranial findings are seen in noncontrast CT  brain. There is subcutaneous hematoma in the left frontal scalp. No fracture is seen in the calvarium. Right maxillary sinusitis. Electronically Signed   By: Elmer Picker M.D.   On: 04/08/2022 13:35   DG Pelvis 1-2 Views  Result Date: 04/08/2022 CLINICAL DATA:  [place]. EXAM: PELVIS - 1-2 VIEW COMPARISON:  CT scan 03/06/2022 FINDINGS: Severe bilateral hip joint degenerative changes. No acute hip fracture. The bony pelvis is grossly intact. No pelvic fractures or bone lesions. The pubic symphysis and SI joints are intact. Stable advanced vascular calcifications. IMPRESSION: Severe bilateral hip joint degenerative changes but no acute bony findings. Electronically Signed   By: Marijo Sanes M.D.   On: 04/08/2022 13:00   DG Chest Portable 1 View  Result Date: 04/08/2022 CLINICAL DATA:  Altered mental status, trauma EXAM: PORTABLE CHEST 1 VIEW COMPARISON:  03/09/2022 FINDINGS: Cardiac and mediastinal contours are within normal limits. No focal pulmonary opacity. No pleural effusion or pneumothorax. No acute osseous abnormality. IMPRESSION: No acute cardiopulmonary process. Electronically Signed   By: Merilyn Baba M.D.   On: 04/08/2022 13:00      Subjective: Patient seen and examined at the bedside this morning.  Hemodynamically stable for discharge today.  Discharge Exam: Vitals:   04/17/22 0524 04/17/22 0857  BP: 109/79 126/74  Pulse: 79 88  Resp:  16  Temp: 98.7 F (37.1 C) 99.1 F (37.3 C)  SpO2: 100% 98%   Vitals:   04/17/22 0410 04/17/22 0500 04/17/22 0524 04/17/22 0857  BP: 117/73  109/79 126/74  Pulse: 92  79 88  Resp: 17   16  Temp: 99.1 F (37.3 C)  98.7 F (37.1 C) 99.1 F (37.3 C)  TempSrc: Oral  Oral Oral  SpO2: 99%  100% 98%  Weight:  41.8 kg    Height:        General: Pt is alert, awake, not in acute distress Cardiovascular: RRR, S1/S2 +, no rubs, no gallops Respiratory: CTA bilaterally, no wheezing, no rhonchi Abdominal: Soft, NT, ND, bowel sounds  + Extremities: no edema, no cyanosis    The results of significant diagnostics from this hospitalization (including imaging, microbiology, ancillary and laboratory) are listed below for reference.     Microbiology: Recent Results (from the past 240 hour(s))  MRSA Next Gen by PCR, Nasal     Status: Abnormal   Collection Time: 04/08/22  7:40 PM   Specimen: Nasal Mucosa; Nasal Swab  Result Value Ref Range Status   MRSA by PCR Next Gen DETECTED (A) NOT DETECTED Final    Comment: RESULT CALLED TO, READ BACK BY AND VERIFIED WITH: RN SARAH CIENER 04/08/22'@22'$ :25 BY TW (NOTE) The GeneXpert MRSA Assay (FDA approved for NASAL specimens only), is one component of a comprehensive MRSA colonization surveillance program. It is not intended to diagnose MRSA infection nor to guide or monitor treatment for MRSA infections. Test performance is not FDA approved in patients less than 97 years old. Performed at [facility], [place] [address], [place], [place] [address]   Culture, Respiratory  w Gram Stain     Status: None   Collection Time: 04/08/22 11:51 PM   Specimen: Tracheal Aspirate; Respiratory  Result Value Ref Range Status   Specimen Description TRACHEAL ASPIRATE  Final   Special Requests NONE  Final   Gram Stain   Final    NO SQUAMOUS EPITHELIAL CELLS SEEN FEW WBC SEEN FEW GRAM POSITIVE RODS MODERATE GRAM POSITIVE COCCI Performed at [facility], [place] [address], [place], [place] [address]    Culture   Final    MODERATE METHICILLIN RESISTANT STAPHYLOCOCCUS AUREUS   Report Status 04/11/2022 FINAL  Final   Organism ID, Bacteria METHICILLIN RESISTANT STAPHYLOCOCCUS AUREUS  Final      Susceptibility   Methicillin resistant staphylococcus aureus - MIC*    CIPROFLOXACIN >=8 RESISTANT Resistant     ERYTHROMYCIN >=8 RESISTANT Resistant     GENTAMICIN <=0.5 SENSITIVE Sensitive     OXACILLIN >=4 RESISTANT Resistant     TETRACYCLINE <=1 SENSITIVE Sensitive     VANCOMYCIN <=0.5  SENSITIVE Sensitive     TRIMETH/SULFA >=320 RESISTANT Resistant     CLINDAMYCIN <=0.25 SENSITIVE Sensitive     RIFAMPIN <=0.5 SENSITIVE Sensitive     Inducible Clindamycin NEGATIVE Sensitive     * MODERATE METHICILLIN RESISTANT STAPHYLOCOCCUS AUREUS  Culture, blood (Routine X 2) w Reflex to ID Panel     Status: None   Collection Time: 04/09/22 10:39 AM   Specimen: BLOOD LEFT HAND  Result Value Ref Range Status   Specimen Description BLOOD LEFT HAND  Final   Special Requests   Final    BOTTLES DRAWN AEROBIC ONLY Blood Culture results may not be optimal due to an inadequate volume of blood received in culture bottles   Culture   Final    NO GROWTH 5 DAYS Performed at [facility], [place] [address] [place], [place] [address]    Report Status 04/14/2022 FINAL  Final  Culture, blood (Routine X 2) w Reflex to ID Panel     Status: None   Collection Time: 04/09/22 11:02 AM   Specimen: BLOOD LEFT HAND  Result Value Ref Range Status   Specimen Description BLOOD LEFT HAND  Final   Special Requests   Final    BOTTLES DRAWN AEROBIC ONLY Blood Culture adequate volume   Culture   Final    NO GROWTH 5 DAYS Performed at [facility], [place] [address] [place], [place] [address]    Report Status 04/14/2022 FINAL  Final     Labs: BNP (last 3 results) Recent Labs    06/23/21 0100 02/02/22 1216  BNP 485.8* 5,701.7*   Basic Metabolic Panel: Recent Labs  Lab 04/10/22 1658 04/10/22 1658 04/11/22 0118 04/11/22 1135 04/11/22 1621 04/12/22 0627 04/13/22 0032 04/14/22 0304 04/15/22 0436 04/17/22 0546  NA  --   --  141   < >  --  140 138 138 133* 131*  K  --   --  5.6*   < >  --  3.8 2.8* 4.2 4.5 4.1  CL  --    < > 110  --   --  105 98 100 98 99  CO2  --    < > 20*  --   --  '24 29 27 24 '$ 20*  GLUCOSE  --    < > 142*  --   --  126* 128* 122* 119* 105*  BUN  --    < > 29*  --   --  42* 39* 36* 32* 47*  CREATININE  --    < > 2.03*  --   --  2.22* 1.97* 1.66* 1.48* 1.64*  CALCIUM   --    < > 8.0*  --   --  7.5* 8.0* 8.6* 8.6* 8.5*  MG 2.7*  --  2.7*  --  2.4  --   --  1.9 1.9 2.1  PHOS 4.7*  --  4.6  --  4.4  --   --  1.8* 3.0  --    < > = values in this interval not displayed.   Liver Function Tests: Recent Labs  Lab 04/15/22 0436  AST 13*  ALT 13  ALKPHOS 69  BILITOT 0.3  PROT 7.1  ALBUMIN 2.0*   No results for input(s): "LIPASE", "AMYLASE" in the last 168 hours. No results for input(s): "AMMONIA" in the last 168 hours. CBC: Recent Labs  Lab 04/11/22 0118 04/11/22 1135 04/12/22 0627 04/13/22 0032 04/15/22 0436  WBC 12.1*  --  8.1 7.3 9.8  NEUTROABS  --   --   --   --  7.5  HGB 11.4* 10.2* 8.6* 9.0* 11.0*  HCT 36.6 30.0* 27.2* 28.3* 33.8*  MCV 88.8  --  88.0 87.1 84.3  PLT 224  --  166 182 260   Cardiac Enzymes: No results for input(s): "CKTOTAL", "CKMB", "CKMBINDEX", "TROPONINI" in the last 168 hours. BNP: Invalid input(s): "POCBNP" CBG: Recent Labs  Lab 04/16/22 1623 04/16/22 2032 04/16/22 2329 04/17/22 0525 04/17/22 0730  GLUCAP 102* 107* 91 117* 124*   D-Dimer No results for input(s): "DDIMER" in the last 72 hours. Hgb A1c No results for input(s): "HGBA1C" in the last 72 hours. Lipid Profile No results for input(s): "CHOL", "HDL", "LDLCALC", "TRIG", "CHOLHDL", "LDLDIRECT" in the last 72 hours. Thyroid function studies No results for input(s): "TSH", "T4TOTAL", "T3FREE", "THYROIDAB" in the last 72 hours.  Invalid input(s): "FREET3" Anemia work up No results for input(s): "VITAMINB12", "FOLATE", "FERRITIN", "TIBC", "IRON", "RETICCTPCT" in the last 72 hours. Urinalysis    Component Value Date/Time   COLORURINE YELLOW 03/05/2022 2253   APPEARANCEUR CLEAR 03/05/2022 2253   LABSPEC 1.014 03/05/2022 2253   PHURINE 7.0 03/05/2022 2253   GLUCOSEU 50 (A) 03/05/2022 2253   HGBUR NEGATIVE 03/05/2022 2253   BILIRUBINUR NEGATIVE 03/05/2022 2253   KETONESUR 20 (A) 03/05/2022 2253   PROTEINUR 100 (A) 03/05/2022 2253   UROBILINOGEN 0.2  12/23/2017 1417   NITRITE NEGATIVE 03/05/2022 2253   LEUKOCYTESUR SMALL (A) 03/05/2022 2253   Sepsis Labs Recent Labs  Lab 04/11/22 0118 04/12/22 0627 04/13/22 0032 04/15/22 0436  WBC 12.1* 8.1 7.3 9.8   Microbiology Recent Results (from the past 240 hour(s))  MRSA Next Gen by PCR, Nasal     Status: Abnormal   Collection Time: 04/08/22  7:40 PM   Specimen: Nasal Mucosa; Nasal Swab  Result Value Ref Range Status   MRSA by PCR Next Gen DETECTED (A) NOT DETECTED Final    Comment: RESULT CALLED TO, READ BACK BY AND VERIFIED WITH: RN SARAH CIENER 04/08/22'@22'$ :25 BY TW (NOTE) The GeneXpert MRSA Assay (FDA approved for NASAL specimens only), is one component of a comprehensive MRSA colonization surveillance program. It is not intended to diagnose MRSA infection nor to guide or monitor treatment for MRSA infections. Test performance is not FDA approved in patients less than 49 years old. Performed at [facility], [place] [address] [place], [place] [address]   Culture, Respiratory w Gram Stain  Status: None   Collection Time: 04/08/22 11:51 PM   Specimen: Tracheal Aspirate; Respiratory  Result Value Ref Range Status   Specimen Description TRACHEAL ASPIRATE  Final   Special Requests NONE  Final   Gram Stain   Final    NO SQUAMOUS EPITHELIAL CELLS SEEN FEW WBC SEEN FEW GRAM POSITIVE RODS MODERATE GRAM POSITIVE COCCI Performed at [facility], [place] [address], [place], [place] [address]    Culture   Final    MODERATE METHICILLIN RESISTANT STAPHYLOCOCCUS AUREUS   Report Status 04/11/2022 FINAL  Final   Organism ID, Bacteria METHICILLIN RESISTANT STAPHYLOCOCCUS AUREUS  Final      Susceptibility   Methicillin resistant staphylococcus aureus - MIC*    CIPROFLOXACIN >=8 RESISTANT Resistant     ERYTHROMYCIN >=8 RESISTANT Resistant     GENTAMICIN <=0.5 SENSITIVE Sensitive     OXACILLIN >=4 RESISTANT Resistant     TETRACYCLINE <=1 SENSITIVE Sensitive     VANCOMYCIN <=0.5  SENSITIVE Sensitive     TRIMETH/SULFA >=320 RESISTANT Resistant     CLINDAMYCIN <=0.25 SENSITIVE Sensitive     RIFAMPIN <=0.5 SENSITIVE Sensitive     Inducible Clindamycin NEGATIVE Sensitive     * MODERATE METHICILLIN RESISTANT STAPHYLOCOCCUS AUREUS  Culture, blood (Routine X 2) w Reflex to ID Panel     Status: None   Collection Time: 04/09/22 10:39 AM   Specimen: BLOOD LEFT HAND  Result Value Ref Range Status   Specimen Description BLOOD LEFT HAND  Final   Special Requests   Final    BOTTLES DRAWN AEROBIC ONLY Blood Culture results may not be optimal due to an inadequate volume of blood received in culture bottles   Culture   Final    NO GROWTH 5 DAYS Performed at [facility], [place] [address], [place], [place] [address]    Report Status 04/14/2022 FINAL  Final  Culture, blood (Routine X 2) w Reflex to ID Panel     Status: None   Collection Time: 04/09/22 11:02 AM   Specimen: BLOOD LEFT HAND  Result Value Ref Range Status   Specimen Description BLOOD LEFT HAND  Final   Special Requests   Final    BOTTLES DRAWN AEROBIC ONLY Blood Culture adequate volume   Culture   Final    NO GROWTH 5 DAYS Performed at [facility], [place] [address], [place],  [address]    Report Status 04/14/2022 FINAL  Final    Please note: You were cared for by a hospitalist during your hospital stay. Once you are discharged, your primary care physician will handle any further medical issues. Please note that NO REFILLS for any discharge medications will be authorized once you are discharged, as it is imperative that you return to your primary care physician (or establish a relationship with a primary care physician if you do not have one) for your post hospital discharge needs so that they can reassess your need for medications and monitor your lab values.    Time coordinating discharge: 40 minutes  SIGNED:   Shelly Coss, MD  [facility] 04/17/2022, 11:03 AM Pager  [phone]  If 7PM-7AM, please contact night-coverage [email or web address] Password [email or web address]

## 2022-04-17 NOTE — TOC Transition Note (Signed)
Transition of Care [facility]) - CM/SW Discharge Note   Patient Details  Name: Sherry Baker MRN: [id number] Date of Birth: 12/22/1965  Transition of Care [facility]) CM/SW Contact:  Tom-Johnson, Renea Ee, RN Phone Number: 04/17/2022, 1:34 PM   Clinical Narrative:     Patient is scheduled for discharge today. CM consulted for home health as patient declines SNF. CM spoke with patient at bedside about home health and patient declined, stating she lives with a room mate named [name] and she does not want home health to come out there.  CM spoke with patient about transportation with RN in room and she stated that she does not have a ride and does not know Larry's new address as he just moved from their previous address. Patient states she does not have anywhere else to go. CM contacted patient's cousin, Crystal listed on her chart. Crystal was able to call [name] on a three way call and [name] at first declined for patient to return to his home but then accepted when patient told him she will change her behaviors.  Outpatient wound care appointment schedule on AVS. Cab voucher given to RN. No further TOC needs noted.   Final next level of care: Other (comment) (Outpatient wound care) Barriers to Discharge: Barriers Resolved   Patient Goals and CMS Choice Patient states their goals for this hospitalization and ongoing recovery are:: To return home CMS Medicare.gov Compare Post Acute Care list provided to:: Patient Choice offered to / list presented to : NA  Discharge Placement                Patient to be transferred to facility by: [facility]      Discharge Plan and Services                DME Arranged: N/A DME Agency: NA       HH Arranged: Refused [place] Agency: NA        Social Determinants of Health (SDOH) Interventions     Readmission Risk Interventions    04/17/2022   11:21 AM 12/10/2021    1:58 PM 11/15/2021    5:02 PM  Readmission Risk Prevention Plan  Transportation  Screening Complete Complete Complete  Medication Review Press photographer) Complete Complete Complete  PCP or Specialist appointment within 3-5 days of discharge Complete Complete Complete  HRI or Home Care Consult Complete Not Complete   HRI or Home Care Consult Pt Refusal Comments  history of unsafe home environment   SW Recovery Care/Counseling Consult Complete Complete   Palliative Care Screening Not Applicable Not Applicable Not [place] Patient Refused Not Applicable Not Applicable

## 2022-04-17 NOTE — TOC Progression Note (Signed)
Transition of Care [facility]) - Initial/Assessment Note    Patient Details  Name: Sherry Baker MRN: [id number] Date of Birth: Oct 07, 1966  Transition of Care [facility]) CM/SW Contact:    Milinda Antis, [place] Phone Number: 04/17/2022, 12:06 PM  Clinical Narrative:                 CSW met with the patient at bedside.  The patient reported that she wants to leave the hospital today.  The patient declined home health and SNF.  CSW was informed by RN that patient is "homeless", but patient reported that she had someone coming to pick her up and a residence to go to.    CSW me with the patient again.  Patient reports that she wants to be sent to [employer] at [employer] to meet her room mate.  CSW inquired about living arrangements.  The patient reported that she will "stay with her cousin" or "go with roommate".  The patient reported that she is ready to go now and not willing to stay at the hospital any longer.    Expected Discharge Plan: Skilled Nursing Facility Barriers to Discharge: Continued Medical Work up   Patient Goals and CMS Choice Patient states their goals for this hospitalization and ongoing recovery are:: To go home CMS Medicare.gov Compare Post Acute Care list provided to:: Patient Choice offered to / list presented to : Patient  Expected Discharge Plan and Services Expected Discharge Plan: [place] arrangements for the past 2 months: Apartment Expected Discharge Date: 04/17/22                                    Prior Living Arrangements/Services Living arrangements for the past 2 months: Apartment Lives with:: Roommate Patient language and need for interpreter reviewed:: Yes Do you feel safe going back to the place where you live?: Yes      Need for Family Participation in Patient Care: Yes (Comment) Care giver support system in place?: No (comment)   Criminal Activity/Legal Involvement Pertinent to Current  Situation/Hospitalization: No - Comment as needed  Activities of Daily Living      Permission Sought/Granted                  Emotional Assessment Appearance:: Appears older than stated age Attitude/Demeanor/Rapport: Inconsistent Affect (typically observed): Stable Orientation: : Oriented to Self, Oriented to Place Alcohol / Substance Use: Illicit Drugs Psych Involvement: No (comment)  Admission diagnosis:  Polysubstance abuse (HCC) [F19.10] Opioid overdose ([place]) [T40.2X1A] Injury of head, initial encounter [S09.90XA] Altered mental status, unspecified altered mental status type [R41.82] Pressure injury of skin of sacral region, unspecified injury stage [L89.159] Overdose of undetermined intent, initial encounter [T50.904A] Patient Active Problem List   Diagnosis Date Noted   Acidosis    Opioid overdose ([place]) 04/08/2022   Pressure injury of skin 03/11/2022   Protein-calorie malnutrition, severe 03/10/2022   UTI (urinary tract infection) 03/06/2022   Hypoglycemia 03/06/2022   Hypernatremia 03/06/2022   Cocaine abuse ([place]) 03/06/2022   High anion gap metabolic acidosis 60/60/0459   Elevated serum creatinine 03/06/2022   Hypercalcemia 03/06/2022   AMS (altered mental status) 03/06/2022   Acquired hypothyroidism    HTN (hypertension)    Endotracheally intubated    Acute encephalopathy 03/05/2022   Protein-calorie malnutrition, severe 02/04/2022   Pressure injury of skin 02/03/2022  Acute encephalopathy 02/02/2022   Chronic Sacral wound, subsequent encounter 12/13/2021   Right hip pain causing ambulatory dysfunction 11/26/2021   Cellulitis of left hand 11/25/2021   Hyponatremia 11/25/2021   CKD (chronic kidney disease), stage III ([place]) 11/25/2021   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   MRSA bacteremia w/ associated pustular rash 11/25/2021   Alcohol use disorder, severe, dependence ([place])    Acute respiratory failure with hypoxia ([place])    Physical deconditioning     Ambulatory dysfunction    Underweight 06/25/2021   Protein-calorie malnutrition, severe 06/25/2021   Homelessness 96/72/8979   Alcoholic cirrhosis of liver without ascites ([place]) 06/23/2021   Alcohol abuse 06/14/2019   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   Normochromic normocytic anemia 06/06/2019   Pressure injury of skin 06/06/2019   Substance abuse ([place])    Seizure ([place]) 05/21/2019   Polysubstance abuse with associated depression and anxiety 05/18/2019   Genetic testing 03/24/2017   Hypertension 02/18/2017   GERD (gastroesophageal reflux disease) 02/18/2017   Rectal cancer ([place]) 01/27/2017   Rectal mass    Anemia 01/16/2017   Constipation    Alcohol abuse    Tobacco abuse    Cocaine abuse ([place])    Bipolar affective disorder ([place])    Severe malnutrition ([place]) 02/13/2014   Substance abuse ([place]) 02/11/2014   Depression 02/11/2014   Thrombocytopenia ([place]) 02/11/2014   PCP:  Merryl Hacker, No Pharmacy:   [facility] [place], [place] [address] [place] [address] Phone: [phone] Fax: [phone]  [facility] [address] [place] [address] Phone: [phone] Fax: [phone]     Social Determinants of Health (SDOH) Interventions    Readmission Risk Interventions    04/17/2022   11:21 AM 12/10/2021    1:58 PM 11/15/2021    5:02 PM  Readmission Risk Prevention Plan  Transportation Screening Complete Complete Complete  Medication Review Press photographer) Complete Complete Complete  PCP or Specialist appointment within 3-5 days of discharge Complete Complete Complete  HRI or Home Care Consult Complete Not Complete   HRI or Home Care Consult Pt Refusal Comments  history of unsafe home environment   SW Recovery Care/Counseling Consult Complete Complete   Palliative Care Screening Not Applicable Not Applicable Not [place] Patient Refused Not Applicable Not Applicable

## 2022-04-17 NOTE — Progress Notes (Signed)
TRH night cross cover note:  I was notified by RN that the patient demonstrated a 4 beat run of nonsustained V. tach on telemetry, before returning to sinus rhythm, without any associated acute symptoms.  Vital signs stable, including afebrile, normotensive, with O2 sats in the high 90s on room air.  Per my chart review, most recent serum potassium level and magnesium level were drawn on 04/15/2022.  I have ordered BMP and serum magnesium levels to be checked this morning to evaluate for any element of optimizability of the patient's run of nonsustained V. tach from an electrolyte standpoint.    Babs Bertin, DO Hospitalist

## 2022-04-17 NOTE — Progress Notes (Signed)
Physical Therapy Treatment Patient Details Name: Sherry Baker MRN: [id number] DOB: [birth year] Today's Date: 04/17/2022   History of Present Illness 56 y/o female presented to ED on 04/08/22 for AMS after hitting head on floor and losing consciousness. Intubated 6/6-6/10. Admitted for acute metabolic encephalopathy due to opiate overdose (UDS positive for benzo, cocaine). PMH - colostomy, chronic pain, htn, chronic opiod therapy, polysubstance abuse    PT Comments    Patient making significant progress towards physical therapy goals. Patient with improved cognition but continues to demonstrate poor awareness into situation and for safety/deficits. Patient able to ambulate 53' with min guard and use of RW. Educated patient about current fall risk and need for assistance at home, patient verbalized understanding but unsure of comprehension. D/c plan updated to HHPT as patient refuses SNF.     Recommendations for follow up therapy are one component of a multi-disciplinary discharge planning process, led by the attending physician.  Recommendations may be updated based on patient status, additional functional criteria and insurance authorization.  Follow Up Recommendations  Home health PT     Assistance Recommended at Discharge Frequent or constant Supervision/Assistance  Patient can return home with the following A little help with walking and/or transfers;A little help with bathing/dressing/bathroom;Assistance with cooking/housework;Direct supervision/assist for financial management;Direct supervision/assist for medications management;Help with stairs or ramp for entrance;Assist for transportation   Equipment Recommendations  Rolling Jeraldean Wechter (2 wheels);BSC/3in1    Recommendations for Other Services       Precautions / Restrictions Precautions Precautions: Fall Restrictions Weight Bearing Restrictions: No     Mobility  Bed Mobility Overal bed mobility: Modified Independent                   Transfers Overall transfer level: Needs assistance Equipment used: Rolling Stefany Starace (2 wheels) Transfers: Sit to/from Stand Sit to Stand: Min assist           General transfer comment: minA to steady upon standing    Ambulation/Gait Ambulation/Gait assistance: Min guard Gait Distance (Feet): 75 Feet Assistive device: Rolling Keanon Bevins (2 wheels) Gait Pattern/deviations: Step-through pattern, Decreased stride length, Trunk flexed Gait velocity: decreased     General Gait Details: min guard for safety. Slow meaningful turning with RW for safety   Stairs             Wheelchair Mobility    Modified Rankin (Stroke Patients Only)       Balance Overall balance assessment: Needs assistance Sitting-balance support: No upper extremity supported, Feet supported Sitting balance-Leahy Scale: Good     Standing balance support: Bilateral upper extremity supported, Reliant on assistive device for balance Standing balance-Leahy Scale: Poor Standing balance comment: reliant on RW for support                            Cognition Arousal/Alertness: Awake/alert Behavior During Therapy: WFL for tasks assessed/performed Overall Cognitive Status: No family/caregiver present to determine baseline cognitive functioning Area of Impairment: Attention, Following commands, Safety/judgement, Awareness, Problem solving                   Current Attention Level: Sustained   Following Commands: Follows one step commands with increased time Safety/Judgement: Decreased awareness of safety, Decreased awareness of deficits Awareness: Emergent Problem Solving: Requires verbal cues, Requires tactile cues, Slow processing General Comments: pleasant throughout. Disoriented to situation and poor awareness into current deficits.        Exercises  General Comments        Pertinent Vitals/Pain Pain Assessment Pain Assessment: Faces Pain Location:  rectum Pain Descriptors / Indicators: Grimacing Pain Intervention(s): Limited activity within patient's tolerance, Monitored during session, Repositioned    Home Living                          Prior Function            PT Goals (current goals can now be found in the care plan section) Acute Rehab PT Goals PT Goal Formulation: With patient Time For Goal Achievement: 04/27/22 Potential to Achieve Goals: Good Progress towards PT goals: Progressing toward goals    Frequency    Min 2X/week      PT Plan Discharge plan needs to be updated    Co-evaluation              AM-PAC PT "6 Clicks" Mobility   Outcome Measure  Help needed turning from your back to your side while in a flat bed without using bedrails?: A Little Help needed moving from lying on your back to sitting on the side of a flat bed without using bedrails?: A Little Help needed moving to and from a bed to a chair (including a wheelchair)?: A Little Help needed standing up from a chair using your arms (e.g., wheelchair or bedside chair)?: A Little Help needed to walk in hospital room?: A Little Help needed climbing 3-5 steps with a railing? : Total 6 Click Score: 16    End of Session Equipment Utilized During Treatment: Gait belt Activity Tolerance: Patient tolerated treatment well Patient left: in chair;with call bell/phone within reach;with chair alarm set Nurse Communication: Mobility status PT Visit Diagnosis: Other abnormalities of gait and mobility (R26.89);Muscle weakness (generalized) (M62.81)     Time: 5520-8022 PT Time Calculation (min) (ACUTE ONLY): 14 min  Charges:  $Therapeutic Activity: 8-22 mins                     Erine Phenix A. Gilford Rile PT, DPT Acute Rehabilitation Services Office [phone]    Linna Hoff 04/17/2022, 2:51 PM

## 2022-04-22 ENCOUNTER — Encounter (HOSPITAL_COMMUNITY): Payer: Self-pay

## 2022-04-22 ENCOUNTER — Ambulatory Visit (HOSPITAL_COMMUNITY)
Admission: EM | Admit: 2022-04-22 | Discharge: 2022-04-22 | Disposition: A | Discharge status: Home / Self Care | Payer: Medicaid Other | Attending: Family Medicine | Admitting: Family Medicine

## 2022-04-22 DIAGNOSIS — S01512A Laceration without foreign body of oral cavity, initial encounter: Secondary | ICD-10-CM | POA: Insufficient documentation

## 2022-04-22 DIAGNOSIS — N3000 Acute cystitis without hematuria: Secondary | ICD-10-CM | POA: Diagnosis not present

## 2022-04-22 LAB — POCT URINALYSIS DIPSTICK, ED / UC
Bilirubin Urine: NEGATIVE
Glucose, UA: NEGATIVE mg/dL
Ketones, ur: NEGATIVE mg/dL
Nitrite: POSITIVE — AB
Protein, ur: >=300 mg/dL — AB
Specific Gravity, Urine: 1.025 (ref 1.005–1.030)
Urobilinogen, UA: 0.2 mg/dL (ref 0.0–1.0)
pH: 7 (ref 5.0–8.0)

## 2022-04-22 MED ORDER — NYSTATIN 100000 UNIT/ML MT SUSP: 5.0000 mL | Freq: Four times a day (QID) | OROMUCOSAL | 0 refills | Status: DC | PRN

## 2022-04-22 MED ORDER — CIPROFLOXACIN HCL 500 MG PO TABS: 500.0000 mg | ORAL_TABLET | Freq: Every day | ORAL | 0 refills | Status: AC

## 2022-04-22 NOTE — ED Provider Notes (Signed)
[facility]    CSN: [id number] Arrival date & time: 04/22/22  1034      History   Chief Complaint Chief Complaint  Patient presents with   Oral Swelling   Urinary Tract Infection    HPI Sherry Baker is a 56 y.o. female.   Presents with dysuria, difficulty starting stream, bilateral flank pain and lower abdominal pressure for 7 days.  Has not attempted treatment of symptom.denies urinary frequency, urgency, hematuria, fever, chills, vaginal discharge, itching or odor.  Has colostomy .recent hospital discharge.    Patient presents with pain to the tongue and swelling for 7 days.  Endorses that she bit down onto and it has caused discomfort since.  Endorses that it feels more difficult to swallow but she is able to do so.  Has attempted use of benzocaine/Chloraseptic spray which has been ineffective.  Dors is that this was evaluated at the hospital during her recent admission.    Past Medical History:  Diagnosis Date   Acquired hypothyroidism    Alcohol abuse    Allergy    PCNS swelling   Arthritis    Bipolar 1 disorder ([place])    Cancer ([place]) 01/21/2017   rectal cancer   Cancer ([place])    Chronic kidney disease    Chronic pain syndrome    Cirrhosis ([place])    Cirrhosis of liver ([place])    Cocaine abuse ([place])    COVID-19 virus infection 06/23/2021   Delirium tremens ([place]) 06/22/2021   Depression    Genetic testing 03/24/2017   Ms. Quant underwent genetic counseling and testing for hereditary cancer syndromes on 02/17/2017. Her results were negative for mutations in all 46 genes analyzed by [facility]'s 46-gene Common Hereditary Cancers Panel. Genes analyzed include: APC, ATM, AXIN2, BARD1, BMPR1A, BRCA1, BRCA2, BRIP1, CDH1, CDKN2A, CHEK2, CTNNA1, DICER1, EPCAM, GREM1, HOXB13, KIT, MEN1, MLH1, MSH2, MSH3, MSH6, MUTYH, NBN,   HTN (hypertension)    Hypertension    Polysubstance abuse ([place])    Rectal cancer ([place])    Suicidal ideation 02/11/2014    Patient Active Problem  List   Diagnosis Date Noted   Acidosis    Opioid overdose ([place]) 04/08/2022   Pressure injury of skin 03/11/2022   Protein-calorie malnutrition, severe 03/10/2022   UTI (urinary tract infection) 03/06/2022   Hypoglycemia 03/06/2022   Hypernatremia 03/06/2022   Cocaine abuse ([place]) 03/06/2022   High anion gap metabolic acidosis 21/19/4174   Elevated serum creatinine 03/06/2022   Hypercalcemia 03/06/2022   AMS (altered mental status) 03/06/2022   Acquired hypothyroidism    HTN (hypertension)    Endotracheally intubated    Acute encephalopathy 03/05/2022   Protein-calorie malnutrition, severe 02/04/2022   Pressure injury of skin 02/03/2022   Acute encephalopathy 02/02/2022   Chronic Sacral wound, subsequent encounter 12/13/2021   Right hip pain causing ambulatory dysfunction 11/26/2021   Cellulitis of left hand 11/25/2021   Hyponatremia 11/25/2021   CKD (chronic kidney disease), stage III ([place]) 11/25/2021   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   MRSA bacteremia w/ associated pustular rash 11/25/2021   Alcohol use disorder, severe, dependence ([place])    Acute respiratory failure with hypoxia ([place])    Physical deconditioning    Ambulatory dysfunction    Underweight 06/25/2021   Protein-calorie malnutrition, severe 06/25/2021   Homelessness 06/16/4817   Alcoholic cirrhosis of liver without ascites ([place]) 06/23/2021   Alcohol abuse 06/14/2019   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   Normochromic normocytic anemia 06/06/2019   Pressure  injury of skin 06/06/2019   Substance abuse ([place])    Seizure ([place]) 05/21/2019   Polysubstance abuse with associated depression and anxiety 05/18/2019   Genetic testing 03/24/2017   Hypertension 02/18/2017   GERD (gastroesophageal reflux disease) 02/18/2017   Rectal cancer ([place]) 01/27/2017   Rectal mass    Anemia 01/16/2017   Constipation    Alcohol abuse    Tobacco abuse    Cocaine abuse ([place])    Bipolar affective disorder ([place])    Severe  malnutrition ([place]) 02/13/2014   Substance abuse ([place]) 02/11/2014   Depression 02/11/2014   Thrombocytopenia ([place]) 02/11/2014    Past Surgical History:  Procedure Laterality Date   ABDOMINAL PERINEAL BOWEL RESECTION N/A 06/18/2017   Procedure: ABDOMINAL PERINEAL RESECTION ERAS PATHWAY;  Surgeon: Leighton Ruff, MD;  Location: [facility];  Service: General;  Laterality: N/A;   BUBBLE STUDY  11/29/2021   Procedure: BUBBLE STUDY;  Surgeon: Berniece Salines, DO;  Location: [facility];  Service: Cardiovascular;;   COLON SURGERY     COLONOSCOPY WITH PROPOFOL Left 01/21/2017   Procedure: COLONOSCOPY WITH PROPOFOL;  Surgeon: Otis Brace, MD;  Location: [facility];  Service: Gastroenterology;  Laterality: Left;   Colostomy     FRACTURE SURGERY     MANDIBLE FRACTURE SURGERY     TEE WITHOUT CARDIOVERSION N/A 11/29/2021   Procedure: TRANSESOPHAGEAL ECHOCARDIOGRAM (TEE);  Surgeon: Berniece Salines, DO;  Location: [facility];  Service: Cardiovascular;  Laterality: N/A;    OB History   No obstetric history on file.      Home Medications    Prior to Admission medications   Medication Sig Start Date End Date Taking? Authorizing Provider  busPIRone (BUSPAR) 15 MG tablet Take 1 tablet (15 mg total) by mouth 2 (two) times daily. 04/17/22  Yes Shelly Coss, MD  carvedilol (COREG) 6.25 MG tablet Take 1 tablet (6.25 mg total) by mouth 2 (two) times daily with a meal. 04/17/22  Yes Adhikari, Amrit, MD  feeding supplement (ENSURE ENLIVE / ENSURE PLUS) LIQD Take 237 mLs by mouth 3 (three) times daily between meals. 12/13/21  Yes Samella Parr, NP  folic acid (FOLVITE) 1 MG tablet Take 1 tablet (1 mg total) by mouth daily. 04/17/22  Yes Shelly Coss, MD  thiamine 100 MG tablet Take 1 tablet (100 mg total) by mouth daily. 04/17/22  Yes Shelly Coss, MD  traMADol (ULTRAM) 50 MG tablet Take 1 tablet (50 mg total) by mouth every 6 (six) hours as needed for moderate pain. 12/13/21  Yes Samella Parr, NP   umeclidinium-vilanterol (ANORO ELLIPTA) 62.5-25 MCG/ACT AEPB Inhale 1 puff into the lungs daily. 04/17/22  Yes Shelly Coss, MD  acetaminophen (TYLENOL) 325 MG tablet Take 2 tablets (650 mg total) by mouth every 12 (twelve) hours. Patient not taking: Reported on 04/09/2022 12/13/21   Samella Parr, NP  albuterol (VENTOLIN HFA) 108 (90 Base) MCG/ACT inhaler Inhale 2 puffs into the lungs every 6 (six) hours as needed for wheezing or shortness of breath. 04/17/22   Shelly Coss, MD  Benzocaine (HURRCAINE) 20 % AERO Use as directed 1 Application in the mouth or throat 3 (three) times daily as needed for mouth pain. 04/17/22   Shelly Coss, MD  ziprasidone (GEODON) 40 MG capsule Take 1 capsule (40 mg total) by mouth 2 (two) times daily with a meal. 11/06/11 01/09/12  Daleen Bo, MD    Family History Family History  Problem Relation Age of Onset   Cancer Mother 64  Originating in the abdomen, otherwise unknwon   Cancer - Other Maternal Aunt        Throat    Social History Social History   Tobacco Use   Smoking status: Every Day    Types: Cigarettes   Smokeless tobacco: Never  Vaping Use   Vaping Use: Never used  Substance Use Topics   Alcohol use: Yes    Comment: uknown   Drug use: Yes    Types: Cocaine, "Crack" cocaine    Comment: 07/28/2017 "nothing in 3 wks"     Allergies   Penicillins   Review of Systems Review of Systems  Constitutional: Negative.   Respiratory: Negative.    Cardiovascular: Negative.   Genitourinary:  Positive for difficulty urinating, dysuria, flank pain and pelvic pain. Negative for decreased urine volume, dyspareunia, enuresis, frequency, genital sores, hematuria, menstrual problem, urgency, vaginal bleeding, vaginal discharge and vaginal pain.     Physical Exam Triage Vital Signs ED Triage Vitals  Enc Vitals Group     BP 04/22/22 1128 (!) 155/84     Pulse Rate 04/22/22 1128 (!) 108     Resp 04/22/22 1128 16     Temp 04/22/22 1128  98.3 F (36.8 C)     Temp Source 04/22/22 1128 Oral     SpO2 04/22/22 1128 96 %     Weight 04/22/22 1129 100 lb (45.4 kg)     Height 04/22/22 1129 '5\' 2"'  (1.575 m)     Head Circumference --      Peak Flow --      Pain Score 04/22/22 1129 10     Pain Loc --      Pain Edu? --      Excl. in [place]? --    No data found.  Updated Vital Signs BP (!) 155/84 (BP Location: Left Arm)   Pulse (!) 108   Temp 98.3 F (36.8 C) (Oral)   Resp 16   Ht '5\' 2"'  (1.575 m)   Wt 100 lb (45.4 kg)   SpO2 96%   BMI 18.29 kg/m   Visual Acuity Right Eye Distance:   Left Eye Distance:   Bilateral Distance:    Right Eye Near:   Left Eye Near:    Bilateral Near:     Physical Exam Constitutional:      Appearance: Normal appearance.  HENT:     Head: Normocephalic.     Mouth/Throat:      Comments: 1 cm laceration with Pauline Trainer tissue to the center present to the tongue, no erythema, drainage or swelling noted, pharynx is clear without obstruction Eyes:     Extraocular Movements: Extraocular movements intact.  Pulmonary:     Effort: Pulmonary effort is normal.  Abdominal:     General: Abdomen is flat. Bowel sounds are normal.     Palpations: Abdomen is soft.     Tenderness: There is no abdominal tenderness. There is no right CVA tenderness or left CVA tenderness.  Skin:    General: Skin is warm and dry.  Neurological:     Mental Status: She is alert and oriented to person, place, and time. Mental status is at baseline.  Psychiatric:        Mood and Affect: Mood normal.        Behavior: Behavior normal.      UC Treatments / Results  Labs (all labs ordered are listed, but only abnormal results are displayed) Labs Reviewed  POCT URINALYSIS DIPSTICK, ED / UC  EKG   Radiology No results found.  Procedures Procedures (including critical care time)  Medications Ordered in UC Medications - No data to display  Initial Impression / Assessment and Plan / UC Course  I have reviewed the  triage vital signs and the nursing notes.  Pertinent labs & imaging results that were available during my care of the patient were reviewed by me and considered in my medical decision making (see chart for details).  Acute cystitis without hematuria Tongue laceration, initial encounter  Urinalysis showing nitrates and Arnett Galindez blood cells, sent for culture, discussed findings with patient, ciprofloxacin 500 mg daily for 5 days prescribed, frequency changed from standard due to creatinine levels, patient may use Tylenol, over-the-counter Pyridium and warm compresses for comfort, advised increase fluid intake and good hygiene for additional support, may follow-up if symptoms continue to persist  Laceration to the tongue occurred greater than 7 days ago and area has already begun to heal, as benzocaine has not been effective we will attempt use of Magic mouthwash, discussed administration with patient, may follow-up as needed Final Clinical Impressions(s) / UC Diagnoses   Final diagnoses:  None   Discharge Instructions   None    ED Prescriptions   None    PDMP not reviewed this encounter.   Hans Eden, [place] 04/22/22 [phone]

## 2022-04-22 NOTE — ED Triage Notes (Signed)
Onset of symptoms last week. Patient states its hard for her to urinate, decreased output, and painful urination.   Tongue swelling for a week. Patient but her tongue. States she went to the hospital for this and was given a throat spray. This did not help. Tongue still swelling and it is hard to swallow.
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
## 2022-04-23 ENCOUNTER — Encounter (HOSPITAL_BASED_OUTPATIENT_CLINIC_OR_DEPARTMENT_OTHER): Payer: Medicaid Other | Attending: Physician Assistant | Admitting: Physician Assistant

## 2022-04-24 ENCOUNTER — Emergency Department (HOSPITAL_COMMUNITY)
Admission: EM | Admit: 2022-04-24 | Discharge: 2022-04-25 | Disposition: A | Discharge status: Home / Self Care | Payer: Medicaid Other | Attending: Emergency Medicine | Admitting: Emergency Medicine

## 2022-04-24 ENCOUNTER — Emergency Department (HOSPITAL_COMMUNITY): Payer: Medicaid Other

## 2022-04-24 DIAGNOSIS — Z79899 Other long term (current) drug therapy: Secondary | ICD-10-CM | POA: Insufficient documentation

## 2022-04-24 DIAGNOSIS — R4182 Altered mental status, unspecified: Secondary | ICD-10-CM | POA: Diagnosis present

## 2022-04-24 DIAGNOSIS — Z85038 Personal history of other malignant neoplasm of large intestine: Secondary | ICD-10-CM | POA: Diagnosis not present

## 2022-04-24 DIAGNOSIS — N183 Chronic kidney disease, stage 3 unspecified: Secondary | ICD-10-CM | POA: Diagnosis not present

## 2022-04-24 DIAGNOSIS — I129 Hypertensive chronic kidney disease with stage 1 through stage 4 chronic kidney disease, or unspecified chronic kidney disease: Secondary | ICD-10-CM | POA: Insufficient documentation

## 2022-04-24 DIAGNOSIS — E876 Hypokalemia: Secondary | ICD-10-CM | POA: Diagnosis not present

## 2022-04-24 LAB — LIPASE, BLOOD: Lipase: 28 U/L (ref 11–51)

## 2022-04-24 LAB — CBC WITH DIFFERENTIAL/PLATELET
Abs Immature Granulocytes: 0.16 10*3/uL — ABNORMAL HIGH (ref 0.00–0.07)
Basophils Absolute: 0 10*3/uL (ref 0.0–0.1)
Basophils Relative: 1 %
Eosinophils Absolute: 0.1 10*3/uL (ref 0.0–0.5)
Eosinophils Relative: 2 %
HCT: 25.8 % — ABNORMAL LOW (ref 36.0–46.0)
Hemoglobin: 8.2 g/dL — ABNORMAL LOW (ref 12.0–15.0)
Immature Granulocytes: 2 %
Lymphocytes Relative: 13 %
Lymphs Abs: 0.9 10*3/uL (ref 0.7–4.0)
MCH: 29.1 pg (ref 26.0–34.0)
MCHC: 31.8 g/dL (ref 30.0–36.0)
MCV: 91.5 fL (ref 80.0–100.0)
Monocytes Absolute: 0.7 10*3/uL (ref 0.1–1.0)
Monocytes Relative: 11 %
Neutro Abs: 4.9 10*3/uL (ref 1.7–7.7)
Neutrophils Relative %: 71 %
Platelets: 382 10*3/uL (ref 150–400)
RBC: 2.82 MIL/uL — ABNORMAL LOW (ref 3.87–5.11)
RDW: 17.9 % — ABNORMAL HIGH (ref 11.5–15.5)
WBC: 6.9 10*3/uL (ref 4.0–10.5)
nRBC: 0 % (ref 0.0–0.2)

## 2022-04-24 LAB — AMMONIA: Ammonia: 15 umol/L (ref 9–35)

## 2022-04-24 LAB — HEPATIC FUNCTION PANEL
ALT: 8 U/L (ref 0–44)
AST: 12 U/L — ABNORMAL LOW (ref 15–41)
Albumin: 2 g/dL — ABNORMAL LOW (ref 3.5–5.0)
Alkaline Phosphatase: 51 U/L (ref 38–126)
Bilirubin, Direct: 0.1 mg/dL (ref 0.0–0.2)
Indirect Bilirubin: 0.4 mg/dL (ref 0.3–0.9)
Total Bilirubin: 0.5 mg/dL (ref 0.3–1.2)
Total Protein: 5.7 g/dL — ABNORMAL LOW (ref 6.5–8.1)

## 2022-04-24 LAB — URINE CULTURE: Culture: >=100000 — AB

## 2022-04-24 LAB — BASIC METABOLIC PANEL WITH GFR
Anion gap: 7 (ref 5–15)
BUN: 16 mg/dL (ref 6–20)
CO2: 18 mmol/L — ABNORMAL LOW (ref 22–32)
Calcium: 8.9 mg/dL (ref 8.9–10.3)
Chloride: 119 mmol/L — ABNORMAL HIGH (ref 98–111)
Creatinine, Ser: 1.27 mg/dL — ABNORMAL HIGH (ref 0.44–1.00)
GFR, Estimated: 50 mL/min — ABNORMAL LOW (ref >60)
Glucose, Bld: 110 mg/dL — ABNORMAL HIGH (ref 70–99)
Potassium: 3.3 mmol/L — ABNORMAL LOW (ref 3.5–5.1)
Sodium: 144 mmol/L (ref 135–145)

## 2022-04-24 LAB — ETHANOL: Alcohol, Ethyl (B): <10 mg/dL (ref <10)

## 2022-04-24 LAB — MAGNESIUM: Magnesium: 1.8 mg/dL (ref 1.7–2.4)

## 2022-04-24 MED ORDER — SODIUM CHLORIDE 0.9 % IV BOLUS
1000.0000 mL | Freq: Once | INTRAVENOUS | Status: AC
  Administered 2022-04-24: 1000 mL via INTRAVENOUS

## 2022-04-24 MED ORDER — POTASSIUM CHLORIDE ER 10 MEQ PO TBCR: 10.0000 meq | EXTENDED_RELEASE_TABLET | Freq: Every day | ORAL | 0 refills | Status: DC

## 2022-04-24 NOTE — ED Triage Notes (Signed)
BIB [facility]. Family called due to Pt being altered x2 days. Screaming repetitive yelling. HX drug use- unsure if used recently. UTI currently being treated. Colostomy looks infected.  Midazolam given due to aggression '5mg'$  Maintaining own airway 99% RA even after sedation HR124-->78HR  BP 106/68   368m NS given

## 2022-04-24 NOTE — Discharge Instructions (Signed)
Please stop using street drugs, which put you at risk for hurting yourself and other people.

## 2022-04-25 MED ORDER — ACETAMINOPHEN 500 MG PO TABS
1000.0000 mg | ORAL_TABLET | Freq: Once | ORAL | Status: AC
  Administered 2022-04-25: 1000 mg via ORAL
  Filled 2022-04-25: qty 2

## 2022-04-25 NOTE — ED Notes (Signed)
Patient given cab voucher to address listed on file. Patient agreeable with discharge plan and discharged with no issue.

## 2022-04-25 NOTE — ED Notes (Signed)
Pt ate a sandwich and drank 6 orange juice

## 2022-05-13 ENCOUNTER — Ambulatory Visit: Payer: Self-pay | Admitting: *Deleted

## 2022-05-13 NOTE — Telephone Encounter (Signed)
  Chief Complaint: chest pain shortness of breath , difficulty walking  Symptoms: chest pain with pain in throat and pain when eating. Reports taking tums and helps relieve pain. Patient waiting on getting money to assist with buying OTC medication.  Frequency: x 2 weeks  Pertinent Negatives: Patient denies difficulty breathing now no sweating no dizziness.  Disposition: '[]'$ ED /'[x]'$ Urgent Care (no appt availability in office) / '[]'$ Appointment(In office/virtual)/ '[]'$  [facility]/ '[]'$ Home Care/ '[]'$ Refused Recommended Disposition /'[x]'$ [facility]/ '[x]'$  Follow-up with PCP Additional Notes:   Recommended mobile bus and patient reports she has to give 3 days notice for transportation to go anywhere. Missed last OV per patient due to being in hospital. Please advise regarding appt and medication.      Reason for Disposition  [1] Patient says chest pain feels exactly the same as previously diagnosed "heartburn" AND [2] describes burning in chest AND [3] accompanying sour taste in mouth  Answer Assessment - Initial Assessment Questions 1. LOCATION: "Where does it hurt?"       Middle of chest  2. RADIATION: "Does the pain go anywhere else?" (e.g., into neck, jaw, arms, back)     Yes to throat  3. ONSET: "When did the chest pain begin?" (Minutes, hours or days)      2 weeks ago  4. PATTERN "Does the pain come and go, or has it been constant since it started?"  "Does it get worse with exertion?"      Comes and goes  5. DURATION: "How long does it last" (e.g., seconds, minutes, hours)     Sometimes all night and wakes up without pain 6. SEVERITY: "How bad is the pain?"  (e.g., Scale 1-10; mild, moderate, or severe)    - MILD (1-3): doesn't interfere with normal activities     - MODERATE (4-7): interferes with normal activities or awakens from sleep    - SEVERE (8-10): excruciating pain, unable to do any normal activities       Reports pain when going to sleep and wakes up without  pain 7. CARDIAC RISK FACTORS: "Do you have any history of heart problems or risk factors for heart disease?" (e.g., angina, prior heart attack; diabetes, high blood pressure, high cholesterol, smoker, or strong family history of heart disease)     See  hx  8. PULMONARY RISK FACTORS: "Do you have any history of lung disease?"  (e.g., blood clots in lung, asthma, emphysema, birth control pills)     See hx  9. CAUSE: "What do you think is causing the chest pain?"     Reflux  10. OTHER SYMPTOMS: "Do you have any other symptoms?" (e.g., dizziness, nausea, vomiting, sweating, fever, difficulty breathing, cough)       Chest pain when she eats has vomited before  11. PREGNANCY: "Is there any chance you are pregnant?" "When was your last menstrual period?"       na  Protocols used: Chest Pain-A-AH

## 2022-05-14 NOTE — Telephone Encounter (Signed)
Noted.  Per epic patient has upcoming appointment with Pulmonary.

## 2022-05-21 ENCOUNTER — Emergency Department (HOSPITAL_COMMUNITY): Payer: Medicaid Other

## 2022-05-21 ENCOUNTER — Ambulatory Visit (HOSPITAL_COMMUNITY)
Admission: EM | Admit: 2022-05-21 | Discharge: 2022-05-21 | Disposition: A | Discharge status: Home / Self Care | Payer: Medicaid Other

## 2022-05-21 ENCOUNTER — Other Ambulatory Visit: Payer: Self-pay

## 2022-05-21 ENCOUNTER — Encounter (HOSPITAL_COMMUNITY): Payer: Self-pay | Admitting: Emergency Medicine

## 2022-05-21 ENCOUNTER — Emergency Department (HOSPITAL_COMMUNITY)
Admission: EM | Admit: 2022-05-21 | Discharge: 2022-05-21 | Disposition: A | Discharge status: Home / Self Care | Payer: Medicaid Other | Attending: Emergency Medicine | Admitting: Emergency Medicine

## 2022-05-21 DIAGNOSIS — Z85828 Personal history of other malignant neoplasm of skin: Secondary | ICD-10-CM | POA: Diagnosis not present

## 2022-05-21 DIAGNOSIS — T8189XA Other complications of procedures, not elsewhere classified, initial encounter: Secondary | ICD-10-CM | POA: Diagnosis present

## 2022-05-21 DIAGNOSIS — T148XXA Other injury of unspecified body region, initial encounter: Secondary | ICD-10-CM

## 2022-05-21 DIAGNOSIS — R1084 Generalized abdominal pain: Secondary | ICD-10-CM

## 2022-05-21 DIAGNOSIS — L988 Other specified disorders of the skin and subcutaneous tissue: Secondary | ICD-10-CM

## 2022-05-21 LAB — CBC WITH DIFFERENTIAL/PLATELET
Abs Immature Granulocytes: 0.17 10*3/uL — ABNORMAL HIGH (ref 0.00–0.07)
Basophils Absolute: 0 10*3/uL (ref 0.0–0.1)
Basophils Relative: 1 %
Eosinophils Absolute: 0.2 10*3/uL (ref 0.0–0.5)
Eosinophils Relative: 3 %
HCT: 35.7 % — ABNORMAL LOW (ref 36.0–46.0)
Hemoglobin: 11.1 g/dL — ABNORMAL LOW (ref 12.0–15.0)
Immature Granulocytes: 3 %
Lymphocytes Relative: 23 %
Lymphs Abs: 1.4 10*3/uL (ref 0.7–4.0)
MCH: 27.8 pg (ref 26.0–34.0)
MCHC: 31.1 g/dL (ref 30.0–36.0)
MCV: 89.5 fL (ref 80.0–100.0)
Monocytes Absolute: 0.5 10*3/uL (ref 0.1–1.0)
Monocytes Relative: 8 %
Neutro Abs: 4.1 10*3/uL (ref 1.7–7.7)
Neutrophils Relative %: 62 %
Platelets: UNDETERMINED 10*3/uL (ref 150–400)
RBC: 3.99 MIL/uL (ref 3.87–5.11)
RDW: 15.9 % — ABNORMAL HIGH (ref 11.5–15.5)
WBC: 6.4 10*3/uL (ref 4.0–10.5)
nRBC: 0 % (ref 0.0–0.2)

## 2022-05-21 LAB — BASIC METABOLIC PANEL
Anion gap: 7 (ref 5–15)
BUN: 19 mg/dL (ref 6–20)
CO2: 19 mmol/L — ABNORMAL LOW (ref 22–32)
Calcium: 9.3 mg/dL (ref 8.9–10.3)
Chloride: 109 mmol/L (ref 98–111)
Creatinine, Ser: 1.38 mg/dL — ABNORMAL HIGH (ref 0.44–1.00)
GFR, Estimated: 45 mL/min — ABNORMAL LOW (ref >60)
Glucose, Bld: 85 mg/dL (ref 70–99)
Potassium: 5.2 mmol/L — ABNORMAL HIGH (ref 3.5–5.1)
Sodium: 135 mmol/L (ref 135–145)

## 2022-05-21 MED ORDER — IOHEXOL 9 MG/ML PO SOLN: 1000.0000 mL | ORAL | Status: AC

## 2022-05-21 MED ORDER — MORPHINE SULFATE (PF) 4 MG/ML IV SOLN
4.0000 mg | Freq: Once | INTRAVENOUS | Status: AC
  Administered 2022-05-21: 4 mg via INTRAVENOUS
  Filled 2022-05-21: qty 1

## 2022-05-21 MED ORDER — IOHEXOL 300 MG/ML  SOLN
75.0000 mL | Freq: Once | INTRAMUSCULAR | Status: AC | PRN
  Administered 2022-05-21: 75 mL via INTRAVENOUS

## 2022-05-21 MED ORDER — SODIUM CHLORIDE 0.9 % IV BOLUS
1000.0000 mL | Freq: Once | INTRAVENOUS | Status: AC
  Administered 2022-05-21: 1000 mL via INTRAVENOUS

## 2022-05-21 MED ORDER — IOHEXOL 9 MG/ML PO SOLN
ORAL | Status: AC
  Administered 2022-05-21: 1000 mL via ORAL
  Filled 2022-05-21: qty 1000

## 2022-05-21 NOTE — ED Provider Notes (Signed)
[facility] DEPT Provider Note   CSN: [id number] Arrival date & time: 05/21/22  1538     History  Chief Complaint  Patient presents with   Wound Infection    Sherry Baker is a 56 y.o. female.  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  She says she is noticed for about a week.  She.  Blisters seem to open up and has a persistent draining.  Otherwise denies any fevers or cough.  Complaining of persistent abdominal pain unchanged denies vomiting or diarrhea.       Home Medications Prior to Admission medications   Medication Sig Start Date End Date Taking? Authorizing Provider  acetaminophen (TYLENOL) 325 MG tablet Take 2 tablets (650 mg total) by mouth every 12 (twelve) hours. Patient not taking: Reported on 04/09/2022 12/13/21   Samella Parr, NP  albuterol (VENTOLIN HFA) 108 (90 Base) MCG/ACT inhaler Inhale 2 puffs into the lungs every 6 (six) hours as needed for wheezing or shortness of breath. 04/17/22   Shelly Coss, MD  Benzocaine (HURRCAINE) 20 % AERO Use as directed 1 Application in the mouth or throat 3 (three) times daily as needed for mouth pain. 04/17/22   Shelly Coss, MD  busPIRone (BUSPAR) 15 MG tablet Take 1 tablet (15 mg total) by mouth 2 (two) times daily. 04/17/22   Shelly Coss, MD  carvedilol (COREG) 6.25 MG tablet Take 1 tablet (6.25 mg total) by mouth 2 (two) times daily with a meal. 04/17/22   Shelly Coss, MD  feeding supplement (ENSURE ENLIVE / ENSURE PLUS) LIQD Take 237 mLs by mouth 3 (three) times daily between meals. 12/13/21   Samella Parr, NP  folic acid (FOLVITE) 1 MG tablet Take 1 tablet (1 mg total) by mouth daily. 04/17/22   Shelly Coss, MD  magic mouthwash (nystatin, lidocaine, diphenhydrAMINE, alum & mag hydroxide) suspension Swish and spit 5 mLs 4 (four) times daily as needed for mouth pain.  04/22/22   White, Leitha Schuller, NP  potassium chloride (KLOR-CON) 10 MEQ tablet Take 1 tablet (10 mEq total) by mouth daily for 20 doses. 04/24/22 05/14/22  Wyvonnia Dusky, MD  thiamine 100 MG tablet Take 1 tablet (100 mg total) by mouth daily. 04/17/22   Shelly Coss, MD  traMADol (ULTRAM) 50 MG tablet Take 1 tablet (50 mg total) by mouth every 6 (six) hours as needed for moderate pain. 12/13/21   Samella Parr, NP  umeclidinium-vilanterol (ANORO ELLIPTA) 62.5-25 MCG/ACT AEPB Inhale 1 puff into the lungs daily. 04/17/22   Shelly Coss, MD  ziprasidone (GEODON) 40 MG capsule Take 1 capsule (40 mg total) by mouth 2 (two) times daily with a meal. 11/06/11 01/09/12  Daleen Bo, MD      Allergies    Penicillins    Review of Systems   Review of Systems  Constitutional:  Negative for fever.  HENT:  Negative for ear pain.   Eyes:  Negative for pain.  Respiratory:  Negative for cough.   Cardiovascular:  Negative for chest pain.  Gastrointestinal:  Positive for abdominal pain.  Genitourinary:  Negative for flank pain.  Musculoskeletal:  Negative for back pain.  Skin:  Negative for rash.  Neurological:  Negative for headaches.    Physical Exam Updated Vital Signs BP (!) 154/86   Pulse 77   Temp 98.7 F (37.1 C) (Oral)   Resp Marland Kitchen)  24   Ht '5\' 2"'$  (1.575 m)   Wt 45.4 kg   SpO2 98%   BMI 18.29 kg/m  Physical Exam Constitutional:      General: She is not in acute distress.    Appearance: Normal appearance.  HENT:     Head: Normocephalic.     Nose: Nose normal.  Eyes:     Extraocular Movements: Extraocular movements intact.  Cardiovascular:     Rate and Rhythm: Normal rate.  Pulmonary:     Effort: Pulmonary effort is normal.  Abdominal:     Comments: Abdomen is soft.  Diffusely tender mild to moderate.  No guarding or rebound.  Suprapubic lesion is present in the mid suprapubic region.  Visually appears almost slightly suprapubic catheter insertion site, however the patient  denies any prior suprapubic catheters.  Scant amount of clear discharge noted at the lesion site.  No cellulitis noted.  Musculoskeletal:        General: Normal range of motion.     Cervical back: Normal range of motion.  Neurological:     General: No focal deficit present.     Mental Status: She is alert. Mental status is at baseline.     ED Results / Procedures / Treatments   Labs (all labs ordered are listed, but only abnormal results are displayed) Labs Reviewed  CBC WITH DIFFERENTIAL/PLATELET - Abnormal; Notable for the following components:      Result Value   Hemoglobin 11.1 (*)    HCT 35.7 (*)    RDW 15.9 (*)    All other components within normal limits  BASIC METABOLIC PANEL - Abnormal; Notable for the following components:   Potassium 5.2 (*)    CO2 19 (*)    Creatinine, Ser 1.38 (*)    GFR, Estimated 45 (*)    All other components within normal limits    EKG None  Radiology CT Abdomen Pelvis W Contrast  Result Date: 05/21/2022 CLINICAL DATA:  Abdominal pain. EXAM: CT ABDOMEN AND PELVIS WITH CONTRAST TECHNIQUE: Multidetector CT imaging of the abdomen and pelvis was performed using the standard protocol following bolus administration of intravenous contrast. RADIATION DOSE REDUCTION: This exam was performed according to the departmental dose-optimization program which includes automated exposure control, adjustment of the mA and/or kV according to patient size and/or use of iterative reconstruction technique. CONTRAST:  64m OMNIPAQUE IOHEXOL 300 MG/ML  SOLN COMPARISON:  Mar 06, 2022 FINDINGS: Lower chest: Chronic interstitial lung changes in the lung bases. Hepatobiliary: No focal liver abnormality is seen. No gallstones, gallbladder wall thickening, or biliary dilatation. Pancreas: Unremarkable. No pancreatic ductal dilatation or surrounding inflammatory changes. Spleen: Normal in size without focal abnormality. Adrenals/Urinary Tract: Normal adrenal glands. Normal left  kidney. Atrophic right kidney. No hydronephrosis. Normal urinary bladder. Stomach/Bowel: Status post left colectomy with ostomy in the left lower quadrant of the abdomen. No evidence of incarceration peristomal hernia or significant inflammatory changes associated with the stoma. No evidence of small-bowel obstruction. Normal stomach. Vascular/Lymphatic: Aortic atherosclerosis. No enlarged abdominal or pelvic lymph nodes. Reproductive: Uterus and bilateral adnexa are unremarkable. Other: Nonspecific soft tissue thickening in the presacral area. Thin gas collection inferior to the sacrum with surrounding soft tissue thickening. Musculoskeletal: Marked sclerotic and cystic dystrophic changes at the bilateral hip joints involving the acetabula in the humeral heads IMPRESSION: 1. Status post left colectomy with ostomy in the left lower quadrant of the abdomen. No evidence of incarceration peristomal hernia or significant inflammatory changes associated with the stoma.  2. Nonspecific soft tissue thickening in the presacral area. 3. Thin gas collection inferior to the sacrum with surrounding soft tissue thickening. Please correlate to physical exam to assess for possible decubital ulcer. 4. Marked sclerotic and cystic dystrophic changes at the bilateral hip joints involving the acetabula and the humeral heads. Aortic Atherosclerosis (ICD10-I70.0). Electronically Signed   By: Fidela Salisbury M.D.   On: 05/21/2022 19:31    Procedures Procedures    Medications Ordered in ED Medications  iohexol (OMNIPAQUE) 9 MG/ML oral solution 1,000 mL (1,000 mLs Oral Contrast Given 05/21/22 1620)  morphine (PF) 4 MG/ML injection 4 mg (4 mg Intravenous Given 05/21/22 1850)  iohexol (OMNIPAQUE) 300 MG/ML solution 75 mL (75 mLs Intravenous Contrast Given 05/21/22 1859)  sodium chloride 0.9 % bolus 1,000 mL (1,000 mLs Intravenous New Bag/Given 05/21/22 1933)    ED Course/ Medical Decision Making/ A&P                            Medical Decision Making Amount and/or Complexity of Data Reviewed Labs: ordered. Radiology: ordered.  Risk Prescription drug management.   Review of records shows a visit April 24, 2022 for altered mental status suspected secondary to polysubstance abuse.  Cardiac monitoring showing sinus rhythm.  Work-up included labs chemistry shows mild potassium elevation 5.2 creatinine of 1.3.  Otherwise white count of 6 hemoglobin 11.  CT abdomen pelvis pursued, no acute findings noted per radiology.  Patient advised outpatient follow-up with the surgical team this week.  Advised to keep the area covered with a dressing and to change it daily, advised return back to the ER immediately if she has fevers worsening symptoms or any additional concerns.        Final Clinical Impression(s) / ED Diagnoses Final diagnoses:  Wound discharge    Rx / DC Orders ED Discharge Orders     None         Luna Fuse, MD 05/21/22 1941

## 2022-05-21 NOTE — ED Provider Notes (Signed)
[facility]    CSN: [id number] Arrival date & time: 05/21/22  3734      History   Chief Complaint Chief Complaint  Patient presents with   Wound Check   Abdominal Pain    HPI Sherry Baker is a 56 y.o. female.   Patient presents today with a 1 week history of worsening wound in her lower abdomen.  She reports associated abdominal pain with swelling/knot formation in her right lower quadrant.  Pain is rated 10 on a 0-10 pain scale, described as sharp, worse with certain movements or activities, no alleviating factors identified.  She reports that she has had significant serous drainage to the point that it is soaking through depends and her clothing.  She does have a history of ostomy but states ostomy site is intact and not particular bothersome.  She denies any increased output from her ostomy.  She denies any known wound in this area.  Denies any recent antibiotic use.  She has not tried any over-the-counter medication for symptom management.    Past Medical History:  Diagnosis Date   Acquired hypothyroidism    Alcohol abuse    Allergy    PCNS swelling   Arthritis    Bipolar 1 disorder ([place])    Cancer ([place]) 01/21/2017   rectal cancer   Cancer ([place])    Chronic kidney disease    Chronic pain syndrome    Cirrhosis ([place])    Cirrhosis of liver ([place])    Cocaine abuse ([place])    COVID-19 virus infection 06/23/2021   Delirium tremens ([place]) 06/22/2021   Depression    Genetic testing 03/24/2017   Ms. Tuel underwent genetic counseling and testing for hereditary cancer syndromes on 02/17/2017. Her results were negative for mutations in all 46 genes analyzed by [facility]'s 46-gene Common Hereditary Cancers Panel. Genes analyzed include: APC, ATM, AXIN2, BARD1, BMPR1A, BRCA1, BRCA2, BRIP1, CDH1, CDKN2A, CHEK2, CTNNA1, DICER1, EPCAM, GREM1, HOXB13, KIT, MEN1, MLH1, MSH2, MSH3, MSH6, MUTYH, NBN,   HTN (hypertension)    Hypertension    Polysubstance abuse ([place])    Rectal  cancer ([place])    Suicidal ideation 02/11/2014    Patient Active Problem List   Diagnosis Date Noted   Acidosis    Opioid overdose ([place]) 04/08/2022   Pressure injury of skin 03/11/2022   Protein-calorie malnutrition, severe 03/10/2022   UTI (urinary tract infection) 03/06/2022   Hypoglycemia 03/06/2022   Hypernatremia 03/06/2022   Cocaine abuse ([place]) 03/06/2022   High anion gap metabolic acidosis 28/76/8115   Elevated serum creatinine 03/06/2022   Hypercalcemia 03/06/2022   AMS (altered mental status) 03/06/2022   Acquired hypothyroidism    HTN (hypertension)    Endotracheally intubated    Acute encephalopathy 03/05/2022   Protein-calorie malnutrition, severe 02/04/2022   Pressure injury of skin 02/03/2022   Acute encephalopathy 02/02/2022   Chronic Sacral wound, subsequent encounter 12/13/2021   Right hip pain causing ambulatory dysfunction 11/26/2021   Cellulitis of left hand 11/25/2021   Hyponatremia 11/25/2021   CKD (chronic kidney disease), stage III ([place]) 11/25/2021   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   MRSA bacteremia w/ associated pustular rash 11/25/2021   Alcohol use disorder, severe, dependence ([place])    Acute respiratory failure with hypoxia ([place])    Physical deconditioning    Ambulatory dysfunction    Underweight 06/25/2021   Protein-calorie malnutrition, severe 06/25/2021   Homelessness 72/62/0355   Alcoholic cirrhosis of liver without ascites ([place]) 06/23/2021   Alcohol abuse 06/14/2019  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   Normochromic normocytic anemia 06/06/2019   Pressure injury of skin 06/06/2019   Substance abuse ([place])    Seizure ([place]) 05/21/2019   Polysubstance abuse with associated depression and anxiety 05/18/2019   Genetic testing 03/24/2017   Hypertension 02/18/2017   GERD (gastroesophageal reflux disease) 02/18/2017   Rectal cancer ([place]) 01/27/2017   Rectal mass    Anemia 01/16/2017   Constipation    Alcohol abuse    Tobacco abuse     Cocaine abuse ([place])    Bipolar affective disorder ([place])    Severe malnutrition ([place]) 02/13/2014   Substance abuse ([place]) 02/11/2014   Depression 02/11/2014   Thrombocytopenia ([place]) 02/11/2014    Past Surgical History:  Procedure Laterality Date   ABDOMINAL PERINEAL BOWEL RESECTION N/A 06/18/2017   Procedure: ABDOMINAL PERINEAL RESECTION ERAS PATHWAY;  Surgeon: Leighton Ruff, MD;  Location: [facility];  Service: General;  Laterality: N/A;   BUBBLE STUDY  11/29/2021   Procedure: BUBBLE STUDY;  Surgeon: Berniece Salines, DO;  Location: [facility];  Service: Cardiovascular;;   COLON SURGERY     COLONOSCOPY WITH PROPOFOL Left 01/21/2017   Procedure: COLONOSCOPY WITH PROPOFOL;  Surgeon: Otis Brace, MD;  Location: [facility];  Service: Gastroenterology;  Laterality: Left;   Colostomy     FRACTURE SURGERY     MANDIBLE FRACTURE SURGERY     TEE WITHOUT CARDIOVERSION N/A 11/29/2021   Procedure: TRANSESOPHAGEAL ECHOCARDIOGRAM (TEE);  Surgeon: Berniece Salines, DO;  Location: [facility];  Service: Cardiovascular;  Laterality: N/A;    OB History   No obstetric history on file.      Home Medications    Prior to Admission medications   Medication Sig Start Date End Date Taking? Authorizing Provider  acetaminophen (TYLENOL) 325 MG tablet Take 2 tablets (650 mg total) by mouth every 12 (twelve) hours. Patient not taking: Reported on 04/09/2022 12/13/21   Samella Parr, NP  albuterol (VENTOLIN HFA) 108 (90 Base) MCG/ACT inhaler Inhale 2 puffs into the lungs every 6 (six) hours as needed for wheezing or shortness of breath. 04/17/22   Shelly Coss, MD  Benzocaine (HURRCAINE) 20 % AERO Use as directed 1 Application in the mouth or throat 3 (three) times daily as needed for mouth pain. 04/17/22   Shelly Coss, MD  busPIRone (BUSPAR) 15 MG tablet Take 1 tablet (15 mg total) by mouth 2 (two) times daily. 04/17/22   Shelly Coss, MD  carvedilol (COREG) 6.25 MG tablet Take 1 tablet (6.25 mg total)  by mouth 2 (two) times daily with a meal. 04/17/22   Shelly Coss, MD  feeding supplement (ENSURE ENLIVE / ENSURE PLUS) LIQD Take 237 mLs by mouth 3 (three) times daily between meals. 12/13/21   Samella Parr, NP  folic acid (FOLVITE) 1 MG tablet Take 1 tablet (1 mg total) by mouth daily. 04/17/22   Shelly Coss, MD  magic mouthwash (nystatin, lidocaine, diphenhydrAMINE, alum & mag hydroxide) suspension Swish and spit 5 mLs 4 (four) times daily as needed for mouth pain. 04/22/22   White, Leitha Schuller, NP  potassium chloride (KLOR-CON) 10 MEQ tablet Take 1 tablet (10 mEq total) by mouth daily for 20 doses. 04/24/22 05/14/22  Wyvonnia Dusky, MD  thiamine 100 MG tablet Take 1 tablet (100 mg total) by mouth daily. 04/17/22   Shelly Coss, MD  traMADol (ULTRAM) 50 MG tablet Take 1 tablet (50 mg total) by mouth every 6 (six) hours as needed for moderate pain. 12/13/21  Samella Parr, NP  umeclidinium-vilanterol (ANORO ELLIPTA) 62.5-25 MCG/ACT AEPB Inhale 1 puff into the lungs daily. 04/17/22   Shelly Coss, MD  ziprasidone (GEODON) 40 MG capsule Take 1 capsule (40 mg total) by mouth 2 (two) times daily with a meal. 11/06/11 01/09/12  Daleen Bo, MD    Family History Family History  Problem Relation Age of Onset   Cancer Mother 59       Originating in the abdomen, otherwise unknwon   Cancer - Other Maternal Aunt        Throat    Social History Social History   Tobacco Use   Smoking status: Every Day    Types: Cigarettes   Smokeless tobacco: Never  Vaping Use   Vaping Use: Never used  Substance Use Topics   Alcohol use: Yes    Comment: uknown   Drug use: Yes    Types: Cocaine, "Crack" cocaine    Comment: 07/28/2017 "nothing in 3 wks"     Allergies   Penicillins   Review of Systems Review of Systems  Constitutional:  Positive for activity change. Negative for appetite change, fatigue and fever.  Gastrointestinal:  Positive for abdominal pain. Negative for diarrhea,  nausea and vomiting.  Skin:  Positive for color change and wound.  Neurological:  Negative for dizziness, light-headedness and headaches.     Physical Exam Triage Vital Signs ED Triage Vitals  Enc Vitals Group     BP 05/21/22 1027 (!) 195/95     Pulse Rate 05/21/22 1027 86     Resp 05/21/22 1027 20     Temp 05/21/22 1027 97.9 F (36.6 C)     Temp src --      SpO2 05/21/22 1027 98 %     Weight --      Height --      Head Circumference --      Peak Flow --      Pain Score 05/21/22 1026 10     Pain Loc --      Pain Edu? --      Excl. in [place]? --    No data found.  Updated Vital Signs BP (!) 195/95 (BP Location: Right Arm)   Pulse 86   Temp 97.9 F (36.6 C)   Resp 20   SpO2 98%   Visual Acuity Right Eye Distance:   Left Eye Distance:   Bilateral Distance:    Right Eye Near:   Left Eye Near:    Bilateral Near:     Physical Exam Vitals reviewed.  Constitutional:      General: She is awake. She is not in acute distress.    Appearance: Normal appearance. She is well-developed. She is not ill-appearing.     Comments: Very pleasant female appears stated age sitting on exam room table in no acute distress   HENT:     Head: Normocephalic and atraumatic.  Cardiovascular:     Rate and Rhythm: Normal rate and regular rhythm.     Heart sounds: Normal heart sounds, S1 normal and S2 normal. No murmur heard. Pulmonary:     Effort: Pulmonary effort is normal.     Breath sounds: Normal breath sounds. No wheezing, rhonchi or rales.     Comments: Clear to auscultation bilaterally Abdominal:     General: Bowel sounds are normal.     Palpations: Abdomen is soft.     Tenderness: There is abdominal tenderness in the right lower quadrant and suprapubic area. There is  no right CVA tenderness, left CVA tenderness, guarding or rebound.       Comments: Ostomy bag present with normal-appearing contents.  Psychiatric:        Behavior: Behavior is cooperative.      UC Treatments /  Results  Labs (all labs ordered are listed, but only abnormal results are displayed) Labs Reviewed - No data to display  EKG   Radiology No results found.  Procedures Procedures (including critical care time)  Medications Ordered in UC Medications - No data to display  Initial Impression / Assessment and Plan / UC Course  I have reviewed the triage vital signs and the nursing notes.  Pertinent labs & imaging results that were available during my care of the patient were reviewed by me and considered in my medical decision making (see chart for details).     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  Given symptoms particularly 10 out of 10 abdominal pain discussed that she would need imaging as well as stat labs.  We do not have these cavities in urgent care and recommend she go to the emergency room.  She is agreeable to this.  Her vital signs are stable at time of discharge and she will have family member take her to [facility] ER immediately following visit today.  Final Clinical Impressions(s) / UC Diagnoses   Final diagnoses:  Cutaneous fistula  Generalized abdominal pain     Discharge Instructions      I am concerned that the whole in your abdomen is allowing drainage from your abdomen and bacteria into your abdomen.  Given you are having such severe pain you need to go to the ER for further evaluation and management.    ED Prescriptions   None    I have reviewed the PDMP during this encounter.   Terrilee Croak, PA-C 05/21/22 1053

## 2022-05-21 NOTE — ED Notes (Signed)
Pt given sandwich and soda.

## 2022-05-21 NOTE — ED Triage Notes (Signed)
Pt reports has a wound area on umbilical area of abd that is leaking for over a week and has a knot beside the area. Reports having lots of abd pains. Reports having to wear depends to try to help catch the leaking fluid. Reports finally able get into [facility] but not til August  Pt has ostomy

## 2022-05-21 NOTE — Discharge Instructions (Addendum)
Your CT scan did not show any deeper lying lesion.  Keep the wound covered.  Follow-up with your surgical team within the week.  Return to the ER if you develop fevers or worsening symptoms.

## 2022-05-21 NOTE — ED Notes (Signed)
Patient is being discharged from the Urgent Care and sent to the Emergency Department via POV with family . Per Verna Czech, PA, patient is in need of higher level of care due to abdominal wound. Patient is aware and verbalizes understanding of plan of care.  Vitals:   05/21/22 1027  BP: (!) 195/95  Pulse: 86  Resp: 20  Temp: 97.9 F (36.6 C)  SpO2: 98%

## 2022-05-21 NOTE — ED Triage Notes (Signed)
Patient has a deep wound in her lower abdomen that is leaking. She reports it started from a blister.    EMS vitals: 138/82 BP 116 HR 18 RR

## 2022-05-21 NOTE — Discharge Instructions (Signed)
I am concerned that the whole in your abdomen is allowing drainage from your abdomen and bacteria into your abdomen.  Given you are having such severe pain you need to go to the ER for further evaluation and management.

## 2022-05-28 ENCOUNTER — Encounter: Payer: Self-pay | Admitting: Pulmonary Disease

## 2022-05-28 ENCOUNTER — Ambulatory Visit (INDEPENDENT_AMBULATORY_CARE_PROVIDER_SITE_OTHER): Payer: Medicaid Other | Admitting: Pulmonary Disease

## 2022-05-28 VITALS — BP 102/72 | HR 101 | Ht 62.0 in | Wt 94.6 lb

## 2022-05-28 DIAGNOSIS — L988 Other specified disorders of the skin and subcutaneous tissue: Secondary | ICD-10-CM | POA: Diagnosis not present

## 2022-05-28 DIAGNOSIS — Z720 Tobacco use: Secondary | ICD-10-CM

## 2022-05-28 DIAGNOSIS — R918 Other nonspecific abnormal finding of lung field: Secondary | ICD-10-CM | POA: Diagnosis not present

## 2022-05-28 NOTE — Progress Notes (Signed)
Synopsis: Referred in July 2023 for lung nodules by Jonetta Osgood, MD  Subjective:   PATIENT ID: Sherry Baker GENDER: female DOB: 12-Sep-1966, MRN: [id number]  Chief Complaint  Patient presents with   Consult    Pt consult admitted 6/6 & d/c 6/15. Pt currently experiencing SOB on exertion, coughing and wheezing. Inhaler seems to help at times    This is a 56 year old female, past medical history of polysubstance abuse, alcohol abuse, cocaine abuse, bipolar disease, arthritis, rectal colon cancer status post colostomy.  Patient with a history of cirrhosis.  Patient was found altered unresponsive lying in the floor, to have blood other female people present in the home.  This was concern for known high risk area of illicit activity.  Patient initially presented to the ER with alcohol level of less than 10, urine drug screen positive for cocaine.  On the floor patient has received greater than 20 mg of Ativan today.  Appears to be in full DTs.  She does appear to be protecting her airway.  She is tachycardic.  She is moaning and flailing all extremities.  Pulmonary critical care was consulted for recommendations and management of agitated delirium and considerations for ICU admission. (Seen in hospital as a consult in 03/2021)  OV 05/28/2022: This is a 56 year old female here for evaluation of lung nodules.  Recently seen in the ER multiple times over the past couple months she is frequently evaluated there.  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  She does have a colostomy with her history of rectal cancer.  We reviewed her CT scan images and her small pulmonary nodules that I think she needs follow-up for.  She was counseled on smoking cessation.  She has smoked since she was 56 years old.    Past Medical History:  Diagnosis Date   Acquired hypothyroidism    Alcohol abuse    Allergy    PCNS  swelling   Arthritis    Bipolar 1 disorder ([place])    Cancer ([place]) 01/21/2017   rectal cancer   Cancer ([place])    Chronic kidney disease    Chronic pain syndrome    Cirrhosis ([place])    Cirrhosis of liver ([place])    Cocaine abuse ([place])    COVID-19 virus infection 06/23/2021   Delirium tremens ([place]) 06/22/2021   Depression    Genetic testing 03/24/2017   Ms. Hollis underwent genetic counseling and testing for hereditary cancer syndromes on 02/17/2017. Her results were negative for mutations in all 46 genes analyzed by [facility]'s 46-gene Common Hereditary Cancers Panel. Genes analyzed include: APC, ATM, AXIN2, BARD1, BMPR1A, BRCA1, BRCA2, BRIP1, CDH1, CDKN2A, CHEK2, CTNNA1, DICER1, EPCAM, GREM1, HOXB13, KIT, MEN1, MLH1, MSH2, MSH3, MSH6, MUTYH, NBN,   HTN (hypertension)    Hypertension    Polysubstance abuse ([place])    Rectal cancer ([place])    Suicidal ideation 02/11/2014     Family History  Problem Relation Age of Onset   Cancer Mother 53       Originating in the abdomen, otherwise unknwon   Cancer - Other Maternal Aunt        Throat     Past Surgical History:  Procedure Laterality Date   ABDOMINAL PERINEAL BOWEL RESECTION N/A 06/18/2017   Procedure: ABDOMINAL PERINEAL RESECTION ERAS PATHWAY;  Surgeon: Leighton Ruff, MD;  Location: [facility];  Service: General;  Laterality: N/A;  BUBBLE STUDY  11/29/2021   Procedure: BUBBLE STUDY;  Surgeon: Berniece Salines, DO;  Location: [facility];  Service: Cardiovascular;;   COLON SURGERY     COLONOSCOPY WITH PROPOFOL Left 01/21/2017   Procedure: COLONOSCOPY WITH PROPOFOL;  Surgeon: Otis Brace, MD;  Location: [facility];  Service: Gastroenterology;  Laterality: Left;   Colostomy     FRACTURE SURGERY     MANDIBLE FRACTURE SURGERY     TEE WITHOUT CARDIOVERSION N/A 11/29/2021   Procedure: TRANSESOPHAGEAL ECHOCARDIOGRAM (TEE);  Surgeon: Berniece Salines, DO;  Location: [facility];  Service: Cardiovascular;  Laterality: N/A;    Social History    Socioeconomic History   Marital status: Unknown    Spouse name: Not on file   Number of children: Not on file   Years of education: Not on file   Highest education level: Not on file  Occupational History   Not on file  Tobacco Use   Smoking status: Every Day    Packs/day: 1.00    Years: 44.00    Total pack years: 44.00    Types: Cigarettes    Start date: 11/04/1976   Smokeless tobacco: Never   Tobacco comments:    6-7 cigarettes a day  Vaping Use   Vaping Use: Never used  Substance and Sexual Activity   Alcohol use: Yes    Comment: uknown   Drug use: Yes    Types: Cocaine, "Crack" cocaine    Comment: 07/28/2017 "nothing in 3 wks"   Sexual activity: Not Currently    Birth control/protection: Abstinence  Other Topics Concern   Not on file  Social History Narrative   ** Merged History Encounter **       ** Merged History Encounter **       ** Merged History Encounter **       Social Determinants of Radio broadcast assistant Strain: Not on file  Food Insecurity: Not on file  Transportation Needs: Not on file  Physical Activity: Not on file  Stress: Not on file  Social Connections: Not on file  Intimate Partner Violence: Not on file     Allergies  Allergen Reactions   Penicillins Hives and Swelling    Tolerated Unasyn, Rocephin Did it involve swelling of the face/tongue/throat, SOB, or low BP? Yes Did it involve sudden or severe rash/hives, skin peeling, or any reaction on the inside of your mouth or nose? No Did you need to seek medical attention at a hospital or doctor's office? No When did it last happen?  <10 years   If all above answers are "NO", may proceed with cephalosporin use.      Outpatient Medications Prior to Visit  Medication Sig Dispense Refill   acetaminophen (TYLENOL) 325 MG tablet Take 2 tablets (650 mg total) by mouth every 12 (twelve) hours. 60 tablet 0   albuterol (VENTOLIN HFA) 108 (90 Base) MCG/ACT inhaler Inhale 2 puffs into the  lungs every 6 (six) hours as needed for wheezing or shortness of breath. 18 g 0   Benzocaine (HURRCAINE) 20 % AERO Use as directed 1 Application in the mouth or throat 3 (three) times daily as needed for mouth pain. 57 g 0   busPIRone (BUSPAR) 15 MG tablet Take 1 tablet (15 mg total) by mouth 2 (two) times daily. 60 tablet 0   carvedilol (COREG) 6.25 MG tablet Take 1 tablet (6.25 mg total) by mouth 2 (two) times daily with a meal. 60 tablet 0   folic acid (FOLVITE)  1 MG tablet Take 1 tablet (1 mg total) by mouth daily. 30 tablet 0   magic mouthwash (nystatin, lidocaine, diphenhydrAMINE, alum & mag hydroxide) suspension Swish and spit 5 mLs 4 (four) times daily as needed for mouth pain. 180 mL 0   traMADol (ULTRAM) 50 MG tablet Take 1 tablet (50 mg total) by mouth every 6 (six) hours as needed for moderate pain. 30 tablet 0   umeclidinium-vilanterol (ANORO ELLIPTA) 62.5-25 MCG/ACT AEPB Inhale 1 puff into the lungs daily. 60 each 0   feeding supplement (ENSURE ENLIVE / ENSURE PLUS) LIQD Take 237 mLs by mouth 3 (three) times daily between meals. (Patient not taking: Reported on 05/28/2022) 237 mL 12   potassium chloride (KLOR-CON) 10 MEQ tablet Take 1 tablet (10 mEq total) by mouth daily for 20 doses. 20 tablet 0   thiamine 100 MG tablet Take 1 tablet (100 mg total) by mouth daily. (Patient not taking: Reported on 05/28/2022) 30 tablet 0   No facility-administered medications prior to visit.    Review of Systems  Constitutional:  Negative for chills, fever, malaise/fatigue and weight loss.  HENT:  Negative for hearing loss, sore throat and tinnitus.   Eyes:  Negative for blurred vision and double vision.  Respiratory:  Positive for cough and shortness of breath. Negative for hemoptysis, sputum production, wheezing and stridor.   Cardiovascular:  Negative for chest pain, palpitations, orthopnea, leg swelling and PND.  Gastrointestinal:  Negative for abdominal pain, constipation, diarrhea, heartburn,  nausea and vomiting.  Genitourinary:  Negative for dysuria, hematuria and urgency.  Musculoskeletal:  Negative for joint pain and myalgias.  Skin:  Negative for itching and rash.  Neurological:  Negative for dizziness, tingling, weakness and headaches.  Endo/Heme/Allergies:  Negative for environmental allergies. Does not bruise/bleed easily.  Psychiatric/Behavioral:  Negative for depression. The patient is not nervous/anxious and does not have insomnia.   All other systems reviewed and are negative.    Objective:  Physical Exam Vitals reviewed.  Constitutional:      General: She is not in acute distress.    Appearance: She is well-developed.  HENT:     Head: Normocephalic and atraumatic.  Eyes:     General: No scleral icterus.    Conjunctiva/sclera: Conjunctivae normal.     Pupils: Pupils are equal, round, and reactive to light.  Neck:     Vascular: No JVD.     Trachea: No tracheal deviation.  Cardiovascular:     Rate and Rhythm: Normal rate and regular rhythm.     Heart sounds: Normal heart sounds. No murmur heard. Pulmonary:     Effort: Pulmonary effort is normal. No tachypnea, accessory muscle usage or respiratory distress.     Breath sounds: No stridor. No wheezing, rhonchi or rales.  Abdominal:     General: There is no distension.     Palpations: Abdomen is soft.     Tenderness: There is no abdominal tenderness.     Comments: Approximately 2 and half inches above the suprapubic bone there is a small opening consistent with a fistula opening.  No visible bleeding, no redness  Musculoskeletal:        General: No tenderness.     Cervical back: Neck supple.  Lymphadenopathy:     Cervical: No cervical adenopathy.  Skin:    General: Skin is warm and dry.     Capillary Refill: Capillary refill takes less than 2 seconds.     Findings: No rash.  Neurological:  Mental Status: She is alert and oriented to person, place, and time.  Psychiatric:        Behavior: Behavior  normal.      Vitals:   05/28/22 1324  BP: 102/72  Pulse: (!) 101  SpO2: 98%  Weight: 94 lb 9.6 oz (42.9 kg)  Height: _0  (1.575 m)   98% on RA BMI Readings from Last 3 Encounters:  05/28/22 17.30 kg/m  05/21/22 18.29 kg/m  04/22/22 18.29 kg/m   Wt Readings from Last 3 Encounters:  05/28/22 94 lb 9.6 oz (42.9 kg)  05/21/22 100 lb (45.4 kg)  04/22/22 100 lb (45.4 kg)     CBC    Component Value Date/Time   WBC 6.4 05/21/2022 1641   RBC 3.99 05/21/2022 1641   HGB 11.1 (L) 05/21/2022 1641   HGB 12.4 01/30/2022 1145   HGB 9.7 (L) 10/07/2017 1335   HCT 35.7 (L) 05/21/2022 1641   HCT 39.6 01/30/2022 1145   HCT 31.5 (L) 10/07/2017 1335   PLT PLATELET CLUMPS NOTED ON SMEAR, UNABLE TO ESTIMATE 05/21/2022 1641   PLT 295 01/30/2022 1145   MCV 89.5 05/21/2022 1641   MCV 84 01/30/2022 1145   MCV 74.2 (L) 10/07/2017 1335   MCH 27.8 05/21/2022 1641   MCHC 31.1 05/21/2022 1641   RDW 15.9 (H) 05/21/2022 1641   RDW 16.1 (H) 01/30/2022 1145   RDW 20.2 (H) 10/07/2017 1335   LYMPHSABS 1.4 05/21/2022 1641   LYMPHSABS 1.5 01/30/2022 1145   LYMPHSABS 2.2 10/07/2017 1335   MONOABS 0.5 05/21/2022 1641   MONOABS 1.0 (H) 10/07/2017 1335   EOSABS 0.2 05/21/2022 1641   EOSABS 0.1 01/30/2022 1145   BASOSABS 0.0 05/21/2022 1641   BASOSABS 0.0 01/30/2022 1145   BASOSABS 0.0 10/07/2017 1335     Chest Imaging: May 2023 CT chest: Small subsolid lesions, 7 mm groundglass lesion The patient's images have been independently reviewed by me.    Pulmonary Functions Testing Results:     No data to display          FeNO:   Pathology:   Echocardiogram:   Heart Catheterization:     Assessment & Plan:     ICD-10-CM   1. Lung nodules  R91.8 CT Super D Chest Wo Contrast    2. 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  I also has a polysubstance abuse history,  cocaine use.  Multiple pulmonary nodules.  Most of these look inflammatory on a recent CT scan but if they stay persistent she of course is at high risk for the development of malignancy.  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 She needs repeat CT scan of the chest in August or September of this year. Follow-up appointment with Bryan Lemma, NP or myself after the CT is complete so we can ensure the stability of her lung nodules.  We were able to secure an appointment this afternoon at [facility] surgery.  We are going to get her over there to be seen today hopefully.  We appreciate the accommodation and help.    Current Outpatient Medications:  acetaminophen (TYLENOL) 325 MG tablet, Take 2 tablets (650 mg total) by mouth every 12 (twelve) hours., Disp: 60 tablet, Rfl: 0   albuterol (VENTOLIN HFA) 108 (90 Base) MCG/ACT inhaler, Inhale 2 puffs into the lungs every 6 (six) hours as needed for wheezing or shortness of breath., Disp: 18 g, Rfl: 0   Benzocaine (HURRCAINE) 20 % AERO, Use as directed 1 Application in the mouth or throat 3 (three) times daily as needed for mouth pain., Disp: 57 g, Rfl: 0   busPIRone (BUSPAR) 15 MG tablet, Take 1 tablet (15 mg total) by mouth 2 (two) times daily., Disp: 60 tablet, Rfl: 0   carvedilol (COREG) 6.25 MG tablet, Take 1 tablet (6.25 mg total) by mouth 2 (two) times daily with a meal., Disp: 60 tablet, Rfl: 0   folic acid (FOLVITE) 1 MG tablet, Take 1 tablet (1 mg total) by mouth daily., Disp: 30 tablet, Rfl: 0   magic mouthwash (nystatin, lidocaine, diphenhydrAMINE, alum & mag hydroxide) suspension, Swish and spit 5 mLs 4 (four) times daily as needed for mouth pain., Disp: 180 mL, Rfl: 0   traMADol (ULTRAM) 50 MG tablet, Take 1 tablet (50 mg total) by mouth  every 6 (six) hours as needed for moderate pain., Disp: 30 tablet, Rfl: 0   umeclidinium-vilanterol (ANORO ELLIPTA) 62.5-25 MCG/ACT AEPB, Inhale 1 puff into the lungs daily., Disp: 60 each, Rfl: 0   feeding supplement (ENSURE ENLIVE / ENSURE PLUS) LIQD, Take 237 mLs by mouth 3 (three) times daily between meals. (Patient not taking: Reported on 05/28/2022), Disp: 237 mL, Rfl: 12   potassium chloride (KLOR-CON) 10 MEQ tablet, Take 1 tablet (10 mEq total) by mouth daily for 20 doses., Disp: 20 tablet, Rfl: 0   thiamine 100 MG tablet, Take 1 tablet (100 mg total) by mouth daily. (Patient not taking: Reported on 05/28/2022), Disp: 30 tablet, Rfl: 0   [name], DO [facility] 05/28/2022 1:43 PM

## 2022-05-28 NOTE — Patient Instructions (Signed)
Thank you for visiting Dr. Valeta Harms at [facility]. Today we recommend the following:  Orders Placed This Encounter  Procedures   CT Super D Chest Wo Contrast    Return in about 6 weeks (around 07/09/2022) for w/ Eric Form after CT chest complete .    Please do your part to reduce the spread of COVID-19.

## 2022-06-01 ENCOUNTER — Emergency Department (HOSPITAL_COMMUNITY)
Admission: EM | Admit: 2022-06-01 | Discharge: 2022-06-01 | Disposition: A | Discharge status: Home / Self Care | Payer: Medicaid Other | Attending: Emergency Medicine | Admitting: Emergency Medicine

## 2022-06-01 ENCOUNTER — Other Ambulatory Visit: Payer: Self-pay

## 2022-06-01 ENCOUNTER — Emergency Department (HOSPITAL_COMMUNITY): Payer: Medicaid Other

## 2022-06-01 DIAGNOSIS — I129 Hypertensive chronic kidney disease with stage 1 through stage 4 chronic kidney disease, or unspecified chronic kidney disease: Secondary | ICD-10-CM | POA: Diagnosis not present

## 2022-06-01 DIAGNOSIS — Z8616 Personal history of COVID-19: Secondary | ICD-10-CM | POA: Insufficient documentation

## 2022-06-01 DIAGNOSIS — K9401 Colostomy hemorrhage: Secondary | ICD-10-CM | POA: Insufficient documentation

## 2022-06-01 DIAGNOSIS — M2518 Fistula, other specified site: Secondary | ICD-10-CM | POA: Diagnosis not present

## 2022-06-01 DIAGNOSIS — Z79899 Other long term (current) drug therapy: Secondary | ICD-10-CM | POA: Insufficient documentation

## 2022-06-01 DIAGNOSIS — E039 Hypothyroidism, unspecified: Secondary | ICD-10-CM | POA: Insufficient documentation

## 2022-06-01 DIAGNOSIS — N189 Chronic kidney disease, unspecified: Secondary | ICD-10-CM | POA: Diagnosis not present

## 2022-06-01 DIAGNOSIS — R109 Unspecified abdominal pain: Secondary | ICD-10-CM | POA: Diagnosis present

## 2022-06-01 DIAGNOSIS — Z85048 Personal history of other malignant neoplasm of rectum, rectosigmoid junction, and anus: Secondary | ICD-10-CM | POA: Insufficient documentation

## 2022-06-01 DIAGNOSIS — L988 Other specified disorders of the skin and subcutaneous tissue: Secondary | ICD-10-CM

## 2022-06-01 LAB — ETHANOL: Alcohol, Ethyl (B): 132 mg/dL — ABNORMAL HIGH (ref <10)

## 2022-06-01 LAB — CBC WITH DIFFERENTIAL/PLATELET
Abs Immature Granulocytes: 0.1 10*3/uL — ABNORMAL HIGH (ref 0.00–0.07)
Basophils Absolute: 0 10*3/uL (ref 0.0–0.1)
Basophils Relative: 0 %
Eosinophils Absolute: 0.2 10*3/uL (ref 0.0–0.5)
Eosinophils Relative: 6 %
HCT: 40 % (ref 36.0–46.0)
Hemoglobin: 12.4 g/dL (ref 12.0–15.0)
Lymphocytes Relative: 26 %
Lymphs Abs: 1 10*3/uL (ref 0.7–4.0)
MCH: 27 pg (ref 26.0–34.0)
MCHC: 31 g/dL (ref 30.0–36.0)
MCV: 87.1 fL (ref 80.0–100.0)
Metamyelocytes Relative: 1 %
Monocytes Absolute: 0.1 10*3/uL (ref 0.1–1.0)
Monocytes Relative: 3 %
Myelocytes: 2 %
Neutro Abs: 2.5 10*3/uL (ref 1.7–7.7)
Neutrophils Relative %: 62 %
Platelets: 292 10*3/uL (ref 150–400)
RBC: 4.59 MIL/uL (ref 3.87–5.11)
RDW: 15.5 % (ref 11.5–15.5)
WBC: 4 10*3/uL (ref 4.0–10.5)
nRBC: 0 % (ref 0.0–0.2)
nRBC: 0 /100 WBC

## 2022-06-01 LAB — BASIC METABOLIC PANEL
Anion gap: 14 (ref 5–15)
BUN: 22 mg/dL — ABNORMAL HIGH (ref 6–20)
CO2: 17 mmol/L — ABNORMAL LOW (ref 22–32)
Calcium: 8.9 mg/dL (ref 8.9–10.3)
Chloride: 105 mmol/L (ref 98–111)
Creatinine, Ser: 1.28 mg/dL — ABNORMAL HIGH (ref 0.44–1.00)
GFR, Estimated: 50 mL/min — ABNORMAL LOW (ref >60)
Glucose, Bld: 77 mg/dL (ref 70–99)
Potassium: 3.8 mmol/L (ref 3.5–5.1)
Sodium: 136 mmol/L (ref 135–145)

## 2022-06-01 MED ORDER — SODIUM CHLORIDE 0.9 % IV BOLUS (SEPSIS)
1000.0000 mL | Freq: Once | INTRAVENOUS | Status: AC
  Administered 2022-06-01: 1000 mL via INTRAVENOUS

## 2022-06-01 MED ORDER — FENTANYL CITRATE PF 50 MCG/ML IJ SOSY
100.0000 ug | PREFILLED_SYRINGE | Freq: Once | INTRAMUSCULAR | Status: AC
  Administered 2022-06-01: 100 ug via INTRAVENOUS
  Filled 2022-06-01: qty 2

## 2022-06-01 MED ORDER — IOHEXOL 9 MG/ML PO SOLN
ORAL | Status: AC
  Filled 2022-06-01: qty 1000

## 2022-06-01 MED ORDER — IOHEXOL 300 MG/ML  SOLN
75.0000 mL | Freq: Once | INTRAMUSCULAR | Status: AC | PRN
  Administered 2022-06-01: 75 mL via INTRAVENOUS

## 2022-06-01 NOTE — Discharge Instructions (Signed)
Follow-up with your surgeon tomorrow.

## 2022-06-01 NOTE — ED Provider Notes (Signed)
Sherry Baker   CSN: [id number] Arrival date & time: 06/01/22  0225     History  Chief Complaint  Patient presents with   Bleeding from Sherry Baker is a 56 y.o. female.  The history is provided by the patient.  Abdominal Pain Pain severity:  Moderate Onset quality:  Gradual Chronicity:  Recurrent Relieved by:  Nothing Associated symptoms: fever and nausea   Risk factors: multiple surgeries   Patient with extensive history including substance use disorder, bipolar, rectal cancer with colostomy in place presents with increasing abdominal pain, bleeding and a possible fistula.  Patient reports she has had a tiny hole below the colostomy for up to a month it continues to leak fluid.  She has been evaluated previously, but has not been able to have a corrected She reports increased pain and bleeding from around the site    Past Medical History:  Diagnosis Date   Acquired hypothyroidism    Alcohol abuse    Allergy    PCNS swelling   Arthritis    Bipolar 1 disorder ([place])    Cancer ([place]) 01/21/2017   rectal cancer   Cancer ([place])    Chronic kidney disease    Chronic pain syndrome    Cirrhosis ([place])    Cirrhosis of liver ([place])    Cocaine abuse ([place])    COVID-19 virus infection 06/23/2021   Delirium tremens ([place]) 06/22/2021   Depression    Genetic testing 03/24/2017   Ms. Olenick underwent genetic counseling and testing for hereditary cancer syndromes on 02/17/2017. Her results were negative for mutations in all 46 genes analyzed by [facility]'s 46-gene Common Hereditary Cancers Panel. Genes analyzed include: APC, ATM, AXIN2, BARD1, BMPR1A, BRCA1, BRCA2, BRIP1, CDH1, CDKN2A, CHEK2, CTNNA1, DICER1, EPCAM, GREM1, HOXB13, KIT, MEN1, MLH1, MSH2, MSH3, MSH6, MUTYH, NBN,   HTN (hypertension)    Hypertension    Polysubstance abuse ([place])    Rectal cancer ([place])    Suicidal ideation 02/11/2014    Home Medications Prior to  Admission medications   Medication Sig Start Date End Date Taking? Authorizing Provider  acetaminophen (TYLENOL) 325 MG tablet Take 2 tablets (650 mg total) by mouth every 12 (twelve) hours. 12/13/21   Samella Parr, NP  albuterol (VENTOLIN HFA) 108 (90 Base) MCG/ACT inhaler Inhale 2 puffs into the lungs every 6 (six) hours as needed for wheezing or shortness of breath. 04/17/22   Shelly Coss, MD  Benzocaine (HURRCAINE) 20 % AERO Use as directed 1 Application in the mouth or throat 3 (three) times daily as needed for mouth pain. 04/17/22   Shelly Coss, MD  busPIRone (BUSPAR) 15 MG tablet Take 1 tablet (15 mg total) by mouth 2 (two) times daily. 04/17/22   Shelly Coss, MD  carvedilol (COREG) 6.25 MG tablet Take 1 tablet (6.25 mg total) by mouth 2 (two) times daily with a meal. 04/17/22   Shelly Coss, MD  feeding supplement (ENSURE ENLIVE / ENSURE PLUS) LIQD Take 237 mLs by mouth 3 (three) times daily between meals. Patient not taking: Reported on 05/28/2022 12/13/21   Samella Parr, NP  folic acid (FOLVITE) 1 MG tablet Take 1 tablet (1 mg total) by mouth daily. 04/17/22   Shelly Coss, MD  magic mouthwash (nystatin, lidocaine, diphenhydrAMINE, alum & mag hydroxide) suspension Swish and spit 5 mLs 4 (four) times daily as needed for mouth pain. 04/22/22   White, Leitha Schuller, NP  potassium chloride (KLOR-CON) 10  MEQ tablet Take 1 tablet (10 mEq total) by mouth daily for 20 doses. 04/24/22 05/14/22  Wyvonnia Dusky, MD  thiamine 100 MG tablet Take 1 tablet (100 mg total) by mouth daily. Patient not taking: Reported on 05/28/2022 04/17/22   Shelly Coss, MD  umeclidinium-vilanterol [facility] ELLIPTA) 62.5-25 MCG/ACT AEPB Inhale 1 puff into the lungs daily. 04/17/22   Shelly Coss, MD  ziprasidone (GEODON) 40 MG capsule Take 1 capsule (40 mg total) by mouth 2 (two) times daily with a meal. 11/06/11 01/09/12  Daleen Bo, MD      Allergies    Penicillins    Review of Systems   Review of  Systems  Constitutional:  Positive for appetite change and fever.  Gastrointestinal:  Positive for abdominal pain and nausea.  Skin:  Positive for wound.    Physical Exam Updated Vital Signs BP 122/75   Pulse 77   Temp (!) 97.5 F (36.4 C) (Oral)   Resp 13   SpO2 (!) 89%  Physical Exam CONSTITUTIONAL: Chronically ill-appearing, appears older than stated age HEAD: Normocephalic/atraumatic EYES: EOMI ENMT: Mucous membranes moist NECK: supple no meningeal signs CV: S1/S2 noted LUNGS: Lungs are clear to auscultation bilaterally ABDOMEN: soft, diffuse moderate tenderness.  Stoma is noted without any stool.  No active bleeding.  There is also a small wound inferior to the stoma that is leaking small amount of fluid, no bleeding, see photos below NEURO: Pt is awake/alert/appropriate, moves all extremitiesx4.  No facial droop.   EXTREMITIES: full ROM SKIN: warm, color normal, see photos PSYCH: Anxious Patient gave verbal permission to utilize photo for medical documentation only The image was not stored on any personal device     ED Results / Procedures / Treatments   Labs (all labs ordered are listed, but only abnormal results are displayed) Labs Reviewed  CBC WITH DIFFERENTIAL/PLATELET - Abnormal; Notable for the following components:      Result Value   Abs Immature Granulocytes 0.10 (*)    All other components within normal limits  ETHANOL - Abnormal; Notable for the following components:   Alcohol, Ethyl (B) 132 (*)    All other components within normal limits  BASIC METABOLIC PANEL - Abnormal; Notable for the following components:   CO2 17 (*)    BUN 22 (*)    Creatinine, Ser 1.28 (*)    GFR, Estimated 50 (*)    All other components within normal limits    EKG EKG Interpretation  Date/Time:  Sunday June 01 2022 02:30:41 EDT Ventricular Rate:  82 PR Interval:  129 QRS Duration: 100 QT Interval:  394 QTC Calculation: 461 R Axis:   83 Text  Interpretation: Sinus rhythm Probable LVH with secondary repol abnrm Interpretation limited secondary to artifact Confirmed by Ripley Fraise [phone]) on 06/01/2022 3:54:25 AM  Radiology No results found.  Procedures Procedures    Medications Ordered in ED Medications  iohexol (OMNIPAQUE) 9 MG/ML oral solution (has no administration in time range)  fentaNYL (SUBLIMAZE) injection 100 mcg (has no administration in time range)  sodium chloride 0.9 % bolus 1,000 mL (has no administration in time range)  fentaNYL (SUBLIMAZE) injection 100 mcg (100 mcg Intravenous Given 06/01/22 0517)    ED Course/ Medical Decision Making/ A&P Clinical Course as of 06/01/22 0709  Sun Jun 01, 2022  0450 Patient with complicated medical history, has had previous colostomy in place and now reports having a small wound for up to a month.  There is concern for fistula.  She had CT abdomen pelvis earlier this month that did not reveal any acute complications [DW]  3790 discussed with on-call radiologist Dr. Rochele Raring states that small fistulas may not show up especially if its adjacent to the colostomy.  He recommends initial CT abdomen pelvis with IV and p.o. contrast.  She may also require fistulogram [DW]  0610 CO2(!): 17 Dehydration [DW]  0703 Signed out to Dr Ronnald Nian at shift change, f/u on CT scan and consult gen. surgery [DW]    Clinical Course User Index [DW] Ripley Fraise, MD                           Medical Decision Making Amount and/or Complexity of Data Reviewed Labs: ordered. Decision-making details documented in ED Course. Radiology: ordered.  Risk Prescription drug management.   This patient presents to the ED for concern of abdominal pain, this involves an extensive number of treatment options, and is a complaint that carries with it a high risk of complications and morbidity.  The differential diagnosis includes but is not limited to pancreatitis, gastritis, bowel obstruction, bowel  perforation, diverticulitis,  ischemic bowel, surgical complication, fistula  Comorbidities that complicate the patient evaluation: Patient's presentation is complicated by their history of substance use disorder  Social Determinants of Health: Patient's  frequent ER visits and nonadherence   increases the complexity of managing their presentation  Additional history obtained: Records reviewed previous admission documents  Lab Tests: I Ordered, and personally interpreted labs.  The pertinent results include:  dehydration   Medicines ordered and prescription drug management: I ordered medication including IV fentanyl for pain Reevaluation of the patient after these medicines showed that the patient    stayed the same  Consultations Obtained: I requested consultation with the consultant radiology Dr. Vanita Panda , and discussed  findings as well as pertinent plan - they recommend: CT scan with IV and p.o. contrast, consider fistulogram  Reevaluation: After the interventions noted above, I reevaluated the patient and found that they have :stayed the same  Complexity of problems addressed: Patient's presentation is most consistent with  acute presentation with potential threat to life or bodily function           Final Clinical Impression(s) / ED Diagnoses Final diagnoses:  Abdominal pain, unspecified abdominal location  Fistula    Rx / DC Orders ED Discharge Orders     None         Ripley Fraise, MD 06/01/22 [phone]

## 2022-06-01 NOTE — ED Triage Notes (Signed)
Pt BIB EMS from home c/o bleeding from her colostomy. Patient states that this has been an on going thing and has tried seeking help for it, but feels like nothing is being done. Past hx of stage 4 rectal cancer. Patient does complain of pain to the site.   VSS w/ EMS

## 2022-06-01 NOTE — ED Provider Notes (Signed)
Signed out to me awaiting CT scan abdomen and pelvis.  History of rectal cancer now with ostomy.  Having bleeding from the site in her lower abdomen.  Has been dealing with possibly a fistula.  There is no active drainage or bleeding on my exam.  CT scan overall with no acute findings.  Does not appear to have any infectious process on the CT scan.  Her sacral decubitus ulcer without any signs of infection at this time on exam and on CT.  Talked with general surgery team who reviewed images and overall she can follow-up outpatient for ongoing work-up for this possible fistula.  She has an appointment with general surgery tomorrow.  This chart was dictated using voice recognition software.  Despite best efforts to proofread,  errors can occur which can change the documentation meaning.    Lennice Sites, DO 06/01/22 1224

## 2022-06-01 NOTE — ED Notes (Signed)
Patient can be heard screaming profanities in her room and is constantly on call bell asking for pain medication. Patient has been notified several times that a provider needs to come in and assess her.

## 2022-06-05 ENCOUNTER — Emergency Department (HOSPITAL_COMMUNITY): Payer: Medicaid Other

## 2022-06-05 ENCOUNTER — Encounter (HOSPITAL_COMMUNITY): Payer: Self-pay | Admitting: Student

## 2022-06-05 ENCOUNTER — Other Ambulatory Visit: Payer: Self-pay

## 2022-06-05 ENCOUNTER — Observation Stay (HOSPITAL_COMMUNITY): Payer: Medicaid Other

## 2022-06-05 ENCOUNTER — Inpatient Hospital Stay (HOSPITAL_COMMUNITY)
Admission: EM | Admit: 2022-06-05 | Discharge: 2022-06-16 | DRG: 917 | Disposition: A | Discharge status: Home / Self Care | Payer: Medicaid Other | Attending: Internal Medicine | Admitting: Internal Medicine

## 2022-06-05 DIAGNOSIS — I130 Hypertensive heart and chronic kidney disease with heart failure and stage 1 through stage 4 chronic kidney disease, or unspecified chronic kidney disease: Secondary | ICD-10-CM | POA: Diagnosis present

## 2022-06-05 DIAGNOSIS — N322 Vesical fistula, not elsewhere classified: Secondary | ICD-10-CM | POA: Diagnosis not present

## 2022-06-05 DIAGNOSIS — F1020 Alcohol dependence, uncomplicated: Secondary | ICD-10-CM | POA: Diagnosis present

## 2022-06-05 DIAGNOSIS — Z681 Body mass index (BMI) 19 or less, adult: Secondary | ICD-10-CM

## 2022-06-05 DIAGNOSIS — E8721 Acute metabolic acidosis: Secondary | ICD-10-CM

## 2022-06-05 DIAGNOSIS — Z9049 Acquired absence of other specified parts of digestive tract: Secondary | ICD-10-CM | POA: Diagnosis not present

## 2022-06-05 DIAGNOSIS — R319 Hematuria, unspecified: Secondary | ICD-10-CM | POA: Diagnosis not present

## 2022-06-05 DIAGNOSIS — E039 Hypothyroidism, unspecified: Secondary | ICD-10-CM | POA: Diagnosis present

## 2022-06-05 DIAGNOSIS — Z5989 Other problems related to housing and economic circumstances: Secondary | ICD-10-CM

## 2022-06-05 DIAGNOSIS — L89154 Pressure ulcer of sacral region, stage 4: Secondary | ICD-10-CM | POA: Diagnosis present

## 2022-06-05 DIAGNOSIS — G9340 Encephalopathy, unspecified: Secondary | ICD-10-CM | POA: Diagnosis present

## 2022-06-05 DIAGNOSIS — L988 Other specified disorders of the skin and subcutaneous tissue: Secondary | ICD-10-CM | POA: Diagnosis not present

## 2022-06-05 DIAGNOSIS — M25551 Pain in right hip: Secondary | ICD-10-CM

## 2022-06-05 DIAGNOSIS — R64 Cachexia: Secondary | ICD-10-CM | POA: Diagnosis present

## 2022-06-05 DIAGNOSIS — E8729 Other acidosis: Secondary | ICD-10-CM | POA: Diagnosis present

## 2022-06-05 DIAGNOSIS — E876 Hypokalemia: Secondary | ICD-10-CM | POA: Diagnosis not present

## 2022-06-05 DIAGNOSIS — R569 Unspecified convulsions: Secondary | ICD-10-CM

## 2022-06-05 DIAGNOSIS — F17210 Nicotine dependence, cigarettes, uncomplicated: Secondary | ICD-10-CM | POA: Diagnosis present

## 2022-06-05 DIAGNOSIS — Z933 Colostomy status: Secondary | ICD-10-CM

## 2022-06-05 DIAGNOSIS — G9349 Other encephalopathy: Secondary | ICD-10-CM | POA: Diagnosis not present

## 2022-06-05 DIAGNOSIS — D509 Iron deficiency anemia, unspecified: Secondary | ICD-10-CM | POA: Diagnosis present

## 2022-06-05 DIAGNOSIS — S31000D Unspecified open wound of lower back and pelvis without penetration into retroperitoneum, subsequent encounter: Secondary | ICD-10-CM

## 2022-06-05 DIAGNOSIS — J9601 Acute respiratory failure with hypoxia: Secondary | ICD-10-CM | POA: Diagnosis not present

## 2022-06-05 DIAGNOSIS — N179 Acute kidney failure, unspecified: Secondary | ICD-10-CM | POA: Diagnosis present

## 2022-06-05 DIAGNOSIS — E870 Hyperosmolality and hypernatremia: Secondary | ICD-10-CM | POA: Diagnosis present

## 2022-06-05 DIAGNOSIS — R7989 Other specified abnormal findings of blood chemistry: Secondary | ICD-10-CM | POA: Diagnosis not present

## 2022-06-05 DIAGNOSIS — C20 Malignant neoplasm of rectum: Secondary | ICD-10-CM | POA: Diagnosis present

## 2022-06-05 DIAGNOSIS — F1090 Alcohol use, unspecified, uncomplicated: Secondary | ICD-10-CM | POA: Diagnosis not present

## 2022-06-05 DIAGNOSIS — E43 Unspecified severe protein-calorie malnutrition: Secondary | ICD-10-CM | POA: Diagnosis present

## 2022-06-05 DIAGNOSIS — T405X1A Poisoning by cocaine, accidental (unintentional), initial encounter: Principal | ICD-10-CM | POA: Diagnosis present

## 2022-06-05 DIAGNOSIS — B961 Klebsiella pneumoniae [K. pneumoniae] as the cause of diseases classified elsewhere: Secondary | ICD-10-CM | POA: Diagnosis not present

## 2022-06-05 DIAGNOSIS — N136 Pyonephrosis: Secondary | ICD-10-CM | POA: Diagnosis present

## 2022-06-05 DIAGNOSIS — E860 Dehydration: Secondary | ICD-10-CM | POA: Diagnosis present

## 2022-06-05 DIAGNOSIS — F419 Anxiety disorder, unspecified: Secondary | ICD-10-CM | POA: Diagnosis present

## 2022-06-05 DIAGNOSIS — E46 Unspecified protein-calorie malnutrition: Secondary | ICD-10-CM | POA: Diagnosis not present

## 2022-06-05 DIAGNOSIS — F319 Bipolar disorder, unspecified: Secondary | ICD-10-CM | POA: Diagnosis present

## 2022-06-05 DIAGNOSIS — F14129 Cocaine abuse with intoxication, unspecified: Secondary | ICD-10-CM | POA: Diagnosis present

## 2022-06-05 DIAGNOSIS — Z791 Long term (current) use of non-steroidal anti-inflammatories (NSAID): Secondary | ICD-10-CM

## 2022-06-05 DIAGNOSIS — N1832 Chronic kidney disease, stage 3b: Secondary | ICD-10-CM | POA: Diagnosis present

## 2022-06-05 DIAGNOSIS — K7460 Unspecified cirrhosis of liver: Secondary | ICD-10-CM | POA: Diagnosis present

## 2022-06-05 DIAGNOSIS — L89159 Pressure ulcer of sacral region, unspecified stage: Secondary | ICD-10-CM | POA: Diagnosis not present

## 2022-06-05 DIAGNOSIS — E872 Acidosis, unspecified: Secondary | ICD-10-CM | POA: Diagnosis present

## 2022-06-05 DIAGNOSIS — K316 Fistula of stomach and duodenum: Secondary | ICD-10-CM | POA: Diagnosis not present

## 2022-06-05 DIAGNOSIS — Z1612 Extended spectrum beta lactamase (ESBL) resistance: Secondary | ICD-10-CM | POA: Diagnosis present

## 2022-06-05 DIAGNOSIS — F1990 Other psychoactive substance use, unspecified, uncomplicated: Secondary | ICD-10-CM | POA: Diagnosis not present

## 2022-06-05 DIAGNOSIS — Z809 Family history of malignant neoplasm, unspecified: Secondary | ICD-10-CM

## 2022-06-05 DIAGNOSIS — K435 Parastomal hernia without obstruction or  gangrene: Secondary | ICD-10-CM | POA: Diagnosis present

## 2022-06-05 DIAGNOSIS — D649 Anemia, unspecified: Secondary | ICD-10-CM | POA: Diagnosis not present

## 2022-06-05 DIAGNOSIS — Z79899 Other long term (current) drug therapy: Secondary | ICD-10-CM

## 2022-06-05 DIAGNOSIS — R4182 Altered mental status, unspecified: Secondary | ICD-10-CM | POA: Diagnosis present

## 2022-06-05 DIAGNOSIS — F32A Depression, unspecified: Secondary | ICD-10-CM | POA: Diagnosis not present

## 2022-06-05 DIAGNOSIS — T424X1A Poisoning by benzodiazepines, accidental (unintentional), initial encounter: Secondary | ICD-10-CM | POA: Diagnosis present

## 2022-06-05 DIAGNOSIS — Z781 Physical restraint status: Secondary | ICD-10-CM

## 2022-06-05 DIAGNOSIS — R32 Unspecified urinary incontinence: Secondary | ICD-10-CM | POA: Diagnosis present

## 2022-06-05 DIAGNOSIS — Z66 Do not resuscitate: Secondary | ICD-10-CM | POA: Diagnosis present

## 2022-06-05 DIAGNOSIS — N390 Urinary tract infection, site not specified: Secondary | ICD-10-CM | POA: Diagnosis not present

## 2022-06-05 DIAGNOSIS — F1910 Other psychoactive substance abuse, uncomplicated: Secondary | ICD-10-CM | POA: Diagnosis not present

## 2022-06-05 DIAGNOSIS — D500 Iron deficiency anemia secondary to blood loss (chronic): Secondary | ICD-10-CM | POA: Diagnosis not present

## 2022-06-05 DIAGNOSIS — Z85038 Personal history of other malignant neoplasm of large intestine: Secondary | ICD-10-CM

## 2022-06-05 DIAGNOSIS — F19980 Other psychoactive substance use, unspecified with psychoactive substance-induced anxiety disorder: Secondary | ICD-10-CM | POA: Diagnosis not present

## 2022-06-05 DIAGNOSIS — D631 Anemia in chronic kidney disease: Secondary | ICD-10-CM | POA: Diagnosis present

## 2022-06-05 DIAGNOSIS — I5042 Chronic combined systolic (congestive) and diastolic (congestive) heart failure: Secondary | ICD-10-CM | POA: Diagnosis present

## 2022-06-05 DIAGNOSIS — Z85048 Personal history of other malignant neoplasm of rectum, rectosigmoid junction, and anus: Secondary | ICD-10-CM

## 2022-06-05 DIAGNOSIS — F10231 Alcohol dependence with withdrawal delirium: Secondary | ICD-10-CM | POA: Diagnosis present

## 2022-06-05 DIAGNOSIS — G928 Other toxic encephalopathy: Secondary | ICD-10-CM | POA: Diagnosis present

## 2022-06-05 LAB — COMPREHENSIVE METABOLIC PANEL
ALT: 8 U/L (ref 0–44)
AST: 23 U/L (ref 15–41)
Albumin: 3.2 g/dL — ABNORMAL LOW (ref 3.5–5.0)
Alkaline Phosphatase: 105 U/L (ref 38–126)
Anion gap: 16 — ABNORMAL HIGH (ref 5–15)
BUN: 34 mg/dL — ABNORMAL HIGH (ref 6–20)
CO2: 12 mmol/L — ABNORMAL LOW (ref 22–32)
Calcium: 8.9 mg/dL (ref 8.9–10.3)
Chloride: 110 mmol/L (ref 98–111)
Creatinine, Ser: 1.91 mg/dL — ABNORMAL HIGH (ref 0.44–1.00)
GFR, Estimated: 31 mL/min — ABNORMAL LOW (ref >60)
Glucose, Bld: 67 mg/dL — ABNORMAL LOW (ref 70–99)
Potassium: 4.5 mmol/L (ref 3.5–5.1)
Sodium: 138 mmol/L (ref 135–145)
Total Bilirubin: 0.8 mg/dL (ref 0.3–1.2)
Total Protein: 8 g/dL (ref 6.5–8.1)

## 2022-06-05 LAB — URINALYSIS, ROUTINE W REFLEX MICROSCOPIC
Bilirubin Urine: NEGATIVE
Glucose, UA: NEGATIVE mg/dL
Ketones, ur: 5 mg/dL — AB
Nitrite: NEGATIVE
Protein, ur: >=300 mg/dL — AB
RBC / HPF: >50 RBC/hpf — ABNORMAL HIGH (ref 0–5)
Specific Gravity, Urine: 1.014 (ref 1.005–1.030)
WBC, UA: >50 WBC/hpf — ABNORMAL HIGH (ref 0–5)
pH: 5 (ref 5.0–8.0)

## 2022-06-05 LAB — CBC WITH DIFFERENTIAL/PLATELET
Abs Immature Granulocytes: 0.06 10*3/uL (ref 0.00–0.07)
Basophils Absolute: 0 10*3/uL (ref 0.0–0.1)
Basophils Relative: 1 %
Eosinophils Absolute: 0.1 10*3/uL (ref 0.0–0.5)
Eosinophils Relative: 1 %
HCT: 36.2 % (ref 36.0–46.0)
Hemoglobin: 11.1 g/dL — ABNORMAL LOW (ref 12.0–15.0)
Immature Granulocytes: 1 %
Lymphocytes Relative: 19 %
Lymphs Abs: 1.1 10*3/uL (ref 0.7–4.0)
MCH: 27.5 pg (ref 26.0–34.0)
MCHC: 30.7 g/dL (ref 30.0–36.0)
MCV: 89.6 fL (ref 80.0–100.0)
Monocytes Absolute: 0.4 10*3/uL (ref 0.1–1.0)
Monocytes Relative: 7 %
Neutro Abs: 4.1 10*3/uL (ref 1.7–7.7)
Neutrophils Relative %: 71 %
Platelets: 317 10*3/uL (ref 150–400)
RBC: 4.04 MIL/uL (ref 3.87–5.11)
RDW: 15.9 % — ABNORMAL HIGH (ref 11.5–15.5)
WBC: 5.7 10*3/uL (ref 4.0–10.5)
nRBC: 0 % (ref 0.0–0.2)

## 2022-06-05 LAB — RAPID URINE DRUG SCREEN, HOSP PERFORMED
Amphetamines: NOT DETECTED
Barbiturates: NOT DETECTED
Benzodiazepines: POSITIVE — AB
Cocaine: POSITIVE — AB
Opiates: NOT DETECTED
Tetrahydrocannabinol: NOT DETECTED

## 2022-06-05 LAB — I-STAT VENOUS BLOOD GAS, ED
Acid-base deficit: 11 mmol/L — ABNORMAL HIGH (ref 0.0–2.0)
Bicarbonate: 14 mmol/L — ABNORMAL LOW (ref 20.0–28.0)
Calcium, Ion: 1.01 mmol/L — ABNORMAL LOW (ref 1.15–1.40)
HCT: 35 % — ABNORMAL LOW (ref 36.0–46.0)
Hemoglobin: 11.9 g/dL — ABNORMAL LOW (ref 12.0–15.0)
O2 Saturation: 97 %
Potassium: 4.5 mmol/L (ref 3.5–5.1)
Sodium: 137 mmol/L (ref 135–145)
TCO2: 15 mmol/L — ABNORMAL LOW (ref 22–32)
pCO2, Ven: 27.2 mmHg — ABNORMAL LOW (ref 44–60)
pH, Ven: 7.32 (ref 7.25–7.43)
pO2, Ven: 93 mmHg — ABNORMAL HIGH (ref 32–45)

## 2022-06-05 LAB — BASIC METABOLIC PANEL
Anion gap: 13 (ref 5–15)
BUN: 27 mg/dL — ABNORMAL HIGH (ref 6–20)
CO2: 13 mmol/L — ABNORMAL LOW (ref 22–32)
Calcium: 7.9 mg/dL — ABNORMAL LOW (ref 8.9–10.3)
Chloride: 116 mmol/L — ABNORMAL HIGH (ref 98–111)
Creatinine, Ser: 1.58 mg/dL — ABNORMAL HIGH (ref 0.44–1.00)
GFR, Estimated: 39 mL/min — ABNORMAL LOW (ref >60)
Glucose, Bld: 59 mg/dL — ABNORMAL LOW (ref 70–99)
Potassium: 3.7 mmol/L (ref 3.5–5.1)
Sodium: 142 mmol/L (ref 135–145)

## 2022-06-05 LAB — SALICYLATE LEVEL: Salicylate Lvl: 18.4 mg/dL (ref 7.0–30.0)

## 2022-06-05 LAB — MAGNESIUM: Magnesium: 2.3 mg/dL (ref 1.7–2.4)

## 2022-06-05 LAB — LACTIC ACID, PLASMA: Lactic Acid, Venous: 1.1 mmol/L (ref 0.5–1.9)

## 2022-06-05 LAB — CBG MONITORING, ED: Glucose-Capillary: 72 mg/dL (ref 70–99)

## 2022-06-05 LAB — PHOSPHORUS: Phosphorus: 5 mg/dL — ABNORMAL HIGH (ref 2.5–4.6)

## 2022-06-05 LAB — ETHANOL: Alcohol, Ethyl (B): <10 mg/dL (ref <10)

## 2022-06-05 LAB — ACETAMINOPHEN LEVEL: Acetaminophen (Tylenol), Serum: <10 ug/mL — ABNORMAL LOW (ref 10–30)

## 2022-06-05 MED ORDER — FOLIC ACID 1 MG PO TABS: 1.0000 mg | ORAL_TABLET | Freq: Every day | ORAL | Status: DC

## 2022-06-05 MED ORDER — SODIUM CHLORIDE 0.9 % IV BOLUS
1000.0000 mL | Freq: Once | INTRAVENOUS | Status: AC
  Administered 2022-06-05: 1000 mL via INTRAVENOUS

## 2022-06-05 MED ORDER — SODIUM CHLORIDE 0.9 % IV SOLN
1.0000 g | INTRAVENOUS | Status: DC
Start: 2022-06-05 — End: 2022-06-07
  Administered 2022-06-05 – 2022-06-07 (×3): 1 g via INTRAVENOUS
  Filled 2022-06-05 (×3): qty 10

## 2022-06-05 MED ORDER — ENOXAPARIN SODIUM 30 MG/0.3ML IJ SOSY
30.0000 mg | PREFILLED_SYRINGE | INTRAMUSCULAR | Status: DC
Start: 2022-06-05 — End: 2022-06-07
  Administered 2022-06-05 – 2022-06-07 (×3): 30 mg via SUBCUTANEOUS
  Filled 2022-06-05 (×3): qty 0.3

## 2022-06-05 MED ORDER — SODIUM CHLORIDE 0.9 % IV SOLN: INTRAVENOUS | Status: DC

## 2022-06-05 MED ORDER — THIAMINE HCL 100 MG PO TABS
100.0000 mg | ORAL_TABLET | Freq: Every day | ORAL | Status: DC
  Administered 2022-06-10: 100 mg via ORAL
  Filled 2022-06-05 (×2): qty 1

## 2022-06-05 MED ORDER — THIAMINE HCL 100 MG/ML IJ SOLN
100.0000 mg | Freq: Every day | INTRAMUSCULAR | Status: DC
  Administered 2022-06-05 – 2022-06-09 (×5): 100 mg via INTRAVENOUS
  Filled 2022-06-05 (×6): qty 2

## 2022-06-05 MED ORDER — LORAZEPAM 2 MG/ML IJ SOLN
INTRAMUSCULAR | Status: AC
  Administered 2022-06-05: 1 mg via INTRAVENOUS
  Filled 2022-06-05: qty 1

## 2022-06-05 MED ORDER — LORAZEPAM 1 MG PO TABS: 1.0000 mg | ORAL_TABLET | ORAL | Status: DC | PRN

## 2022-06-05 MED ORDER — LORAZEPAM 2 MG/ML IJ SOLN: 1.0000 mg | Freq: Once | INTRAMUSCULAR | Status: AC

## 2022-06-05 MED ORDER — ADULT MULTIVITAMIN W/MINERALS CH
1.0000 | ORAL_TABLET | Freq: Every day | ORAL | Status: DC
  Filled 2022-06-05: qty 1

## 2022-06-05 MED ORDER — CEFTRIAXONE SODIUM 2 G IJ SOLR: 2.0000 g | INTRAMUSCULAR | Status: DC

## 2022-06-05 MED ORDER — LORAZEPAM 2 MG/ML IJ SOLN
1.0000 mg | INTRAMUSCULAR | Status: DC | PRN
  Administered 2022-06-06 (×5): 2 mg via INTRAVENOUS
  Administered 2022-06-06: 1 mg via INTRAVENOUS
  Administered 2022-06-07 (×4): 2 mg via INTRAVENOUS
  Filled 2022-06-05 (×10): qty 1

## 2022-06-05 NOTE — Consult Note (Signed)
[place] Nurse ostomy consult note Stoma type/location:  LLQ, end colostomy present since 2018; patient has been independent with her care of her ostomy in the past. Today she is obtunded, no response to my voice or shaking her shoulder/arms. Apparently violent behavior and was given sedatives in the field this am.  Stomal assessment/size: 1 3/8" round, budded, pink, moist Peristomal assessment: intact; peristomal hernia, pouch was falling off along the distal aspect of the pouch Treatment options for stomal/peristomal skin: 2" skin barrier Output  none in pouch Ostomy pouching: 2pc. 2 1/4" with 2" skin barrier ring  Patient did not wake during pouch change  WOC Nurse Consult Note: Reason for Consult: opening "below stoma"; chronic sacral wound Wound type: Unclear etiology of the opening that is midline/suprapubic region; does not seem to communicate with the ostomy Full thickness; deep; to the bone wound associated with APR in 2018. Never healed; dehisced surgical wound Pressure Injury POA: NA Measurement:see nursing flow sheet  Wound bed: sacrum: pale, chronic, non healing Suprapubic: unable to visualize  Periwound:intact  Dressing procedure/placement/frequency: Saline moist gauze dressings to the sacral wound, top with dry dressing. Change daily Strip of silver hydrofiber into the SP wound, top with dry dressing. Change daily Silicone foam to the right LE wounds  Ostomy and wound care to be performed by the bedside nurses.   [place], [place], [place]

## 2022-06-05 NOTE — ED Provider Notes (Signed)
[facility] EMERGENCY DEPARTMENT Provider Note   CSN: [id number] Arrival date & time: 06/05/22  0347     History  Chief Complaint  Patient presents with   Drug Overdose    Sherry Baker is a 56 y.o. female.  56 year old female with history of polysubstance use disorder, hypertension, hyponatremia, who arrives via EMS due to concern for acute cocaine intoxication.  The report describes the patient acting erratically, rolling around in her yard, incontinent of urine, in setting of recent cocaine use.  Patient presenting towards EMS, so they administered 5 mg Versed en route. She became unresponsive but remained hemodynamically stable. NPA placed for airway protection.  On arrival to the ED the patient is unresponsive but appears to be breathing well, she is hemodynamically stable.   Drug Overdose       Home Medications Prior to Admission medications   Medication Sig Start Date End Date Taking? Authorizing Provider  acetaminophen (TYLENOL) 325 MG tablet Take 2 tablets (650 mg total) by mouth every 12 (twelve) hours. 12/13/21   Samella Parr, NP  albuterol (VENTOLIN HFA) 108 (90 Base) MCG/ACT inhaler Inhale 2 puffs into the lungs every 6 (six) hours as needed for wheezing or shortness of breath. 04/17/22   Shelly Coss, MD  Benzocaine (HURRCAINE) 20 % AERO Use as directed 1 Application in the mouth or throat 3 (three) times daily as needed for mouth pain. 04/17/22   Shelly Coss, MD  busPIRone (BUSPAR) 15 MG tablet Take 1 tablet (15 mg total) by mouth 2 (two) times daily. 04/17/22   Shelly Coss, MD  carvedilol (COREG) 6.25 MG tablet Take 1 tablet (6.25 mg total) by mouth 2 (two) times daily with a meal. 04/17/22   Shelly Coss, MD  feeding supplement (ENSURE ENLIVE / ENSURE PLUS) LIQD Take 237 mLs by mouth 3 (three) times daily between meals. Patient not taking: Reported on 05/28/2022 12/13/21   Samella Parr, NP  folic acid (FOLVITE) 1 MG tablet Take 1  tablet (1 mg total) by mouth daily. 04/17/22   Shelly Coss, MD  magic mouthwash (nystatin, lidocaine, diphenhydrAMINE, alum & mag hydroxide) suspension Swish and spit 5 mLs 4 (four) times daily as needed for mouth pain. 04/22/22   White, Leitha Schuller, NP  potassium chloride (KLOR-CON) 10 MEQ tablet Take 1 tablet (10 mEq total) by mouth daily for 20 doses. 04/24/22 05/14/22  Wyvonnia Dusky, MD  thiamine 100 MG tablet Take 1 tablet (100 mg total) by mouth daily. Patient not taking: Reported on 05/28/2022 04/17/22   Shelly Coss, MD  umeclidinium-vilanterol [facility] ELLIPTA) 62.5-25 MCG/ACT AEPB Inhale 1 puff into the lungs daily. 04/17/22   Shelly Coss, MD  ziprasidone (GEODON) 40 MG capsule Take 1 capsule (40 mg total) by mouth 2 (two) times daily with a meal. 11/06/11 01/09/12  Daleen Bo, MD      Allergies    Penicillins    Review of Systems   Review of Systems  Unable to perform ROS: Patient unresponsive    Physical Exam Updated Vital Signs BP (!) 171/93   Pulse 92   Temp (!) 96.6 F (35.9 C) (Temporal)   Resp 16   Ht '5\' 2"'$  (1.575 m)   Wt 49.2 kg   SpO2 97%   BMI 19.84 kg/m  Physical Exam Constitutional:      Comments: Unresponsive female with NPA in place, frail-appearing.  HENT:     Head:     Comments: NPA in place. Eyes:  Comments: PERRL, sluggish. 2 mm.  Cardiovascular:     Rate and Rhythm: Tachycardia present.  Pulmonary:     Comments: Tachypnea. Coarse crackles throughout anterior lung fields. Abdominal:     General: There is no distension.     Palpations: Abdomen is soft.     Tenderness: There is no guarding.  Skin:    General: Skin is warm and dry.     Comments: Sacral decubitus ulcer. See photo.  Neurological:     Comments: Responsive to painful stimuli.      ED Results / Procedures / Treatments   Labs (all labs ordered are listed, but only abnormal results are displayed) Labs Reviewed  COMPREHENSIVE METABOLIC PANEL - Abnormal; Notable for  the following components:      Result Value   CO2 12 (*)    Glucose, Bld 67 (*)    BUN 34 (*)    Creatinine, Ser 1.91 (*)    Albumin 3.2 (*)    GFR, Estimated 31 (*)    Anion gap 16 (*)    All other components within normal limits  ACETAMINOPHEN LEVEL - Abnormal; Notable for the following components:   Acetaminophen (Tylenol), Serum <10 (*)    All other components within normal limits  RAPID URINE DRUG SCREEN, HOSP PERFORMED - Abnormal; Notable for the following components:   Cocaine POSITIVE (*)    Benzodiazepines POSITIVE (*)    All other components within normal limits  CBC WITH DIFFERENTIAL/PLATELET - Abnormal; Notable for the following components:   Hemoglobin 11.1 (*)    RDW 15.9 (*)    All other components within normal limits  URINALYSIS, ROUTINE W REFLEX MICROSCOPIC - Abnormal; Notable for the following components:   APPearance TURBID (*)    Hgb urine dipstick LARGE (*)    Ketones, ur 5 (*)    Protein, ur >=300 (*)    Leukocytes,Ua LARGE (*)    RBC / HPF >50 (*)    WBC, UA >50 (*)    Bacteria, UA MANY (*)    All other components within normal limits  I-STAT VENOUS BLOOD GAS, ED - Abnormal; Notable for the following components:   pCO2, Ven 27.2 (*)    pO2, Ven 93 (*)    Bicarbonate 14.0 (*)    TCO2 15 (*)    Acid-base deficit 11.0 (*)    Calcium, Ion 1.01 (*)    HCT 35.0 (*)    Hemoglobin 11.9 (*)    All other components within normal limits  CULTURE, BLOOD (ROUTINE X 2)  CULTURE, BLOOD (ROUTINE X 2)  URINE CULTURE  SALICYLATE LEVEL  ETHANOL  MAGNESIUM  LACTIC ACID, PLASMA  LACTIC ACID, PLASMA  CBG MONITORING, ED    EKG EKG Interpretation  Date/Time:  Thursday June 05 2022 07:10:10 EDT Ventricular Rate:  105 PR Interval:  120 QRS Duration: 100 QT Interval:  370 QTC Calculation: 489 R Axis:   130 Text Interpretation: Sinus tachycardia Consider left atrial enlargement Right axis deviation Consider left ventricular hypertrophy Probable lateral  infarct, age indeterminate Nonspecific T abnormalities, lateral leads Borderline prolonged QT interval No significant change since last tracing Confirmed by Isla Pence [phone]) on 06/05/2022 7:13:12 AM  Radiology CT HEAD WO CONTRAST (5MM)  Result Date: 06/05/2022 CLINICAL DATA:  Mental status change, unknown cause EXAM: CT HEAD WITHOUT CONTRAST TECHNIQUE: Contiguous axial images were obtained from the base of the skull through the vertex without intravenous contrast. RADIATION DOSE REDUCTION: This exam was performed according to the departmental  dose-optimization program which includes automated exposure control, adjustment of the mA and/or kV according to patient size and/or use of iterative reconstruction technique. COMPARISON:  CT head April 24, 2022. FINDINGS: Brain: No evidence of acute infarction, hemorrhage, hydrocephalus, extra-axial collection or mass lesion/mass effect. Similar chronic microvascular ischemic disease and atrophy. Vascular: No hyperdense vessel identified. Skull: No acute fracture. Sinuses/Orbits: Mild paranasal sinus mucosal thickening. No acute orbital findings. Other: No mastoid effusions.  Six IMPRESSION: No evidence of acute intracranial abnormality. Electronically Signed   By: Margaretha Sheffield M.D.   On: 06/05/2022 08:09   DG Chest Port 1 View  Result Date: 06/05/2022 CLINICAL DATA:  Provided history: Altered mental status. EXAM: PORTABLE CHEST 1 VIEW COMPARISON:  Prior chest radiographs 04/24/2022 and earlier. FINDINGS: Heart size within normal limits. No appreciable airspace consolidation or pulmonary edema. No evidence of pleural effusion or pneumothorax. No acute bony abnormality identified. IMPRESSION: No evidence of acute cardiopulmonary abnormality. Electronically Signed   By: Kellie Simmering D.O.   On: 06/05/2022 08:07    Procedures .Critical Care  Performed by: Nani Gasser, MD Authorized by: Isla Pence, MD   Critical care provider statement:     Critical care time (minutes):  30   Critical care start time:  06/05/2022 7:41 AM   Critical care end time:  06/05/2022 9:39 AM   Critical care was necessary to treat or prevent imminent or life-threatening deterioration of the following conditions:  CNS failure or compromise   Critical care was time spent personally by me on the following activities:  Blood draw for specimens, evaluation of patient's response to treatment, examination of patient, ordering and performing treatments and interventions, ordering and review of laboratory studies, ordering and review of radiographic studies, pulse oximetry, re-evaluation of patient's condition and review of old charts   Medications Ordered in ED Medications  sodium chloride 0.9 % bolus 1,000 mL (1,000 mLs Intravenous New Bag/Given 06/05/22 0728)    And  0.9 %  sodium chloride infusion (has no administration in time range)  cefTRIAXone (ROCEPHIN) 1 g in sodium chloride 0.9 % 100 mL IVPB (has no administration in time range)    ED Course/ Medical Decision Making/ A&P Clinical Course as of 06/05/22 0937  Thu Jun 05, 2022  0741 Arrives unresponsive, hemodynamically stable, ventilating well with NPA in place.  No indication for emergent intubation. [MM]  8921 Glucose-Capillary: 40 [MM]  0814 Head CT without any intracranial bleeding, masses, mass effect. [MM]  1941 CXR does not show any acute chest pathology. [MM]  0815 UA significant for pyuria, hematuria, bacteriuria.  We will obtain routine blood cultures x2, urine culture, lactic acid.  Start ceftriaxone 1 g IV. [MM]  [phone] Case discussed with IMTS for admission to floor. [MM]    Clinical Course User Index [MM] Nani Gasser, MD                           Medical Decision Making Amount and/or Complexity of Data Reviewed Labs: ordered. Decision-making details documented in ED Course. Radiology: ordered.  Risk Prescription drug management. Decision regarding hospitalization.   This patient  presents to the ED for concern of altered mental status in setting of acute cocaine intoxication, this involves an extensive number of treatment options, and is a complaint that carries with it a high risk of complications and morbidity.  She was also found to have a UTI on arrival to the ED. The differential diagnosis includes cocaine overdose,  ACS, CVA, sepsis due to UTI.   Co morbidities that complicate the patient evaluation  Polysubstance use Hypertension   Social Determinants of Health:  Polysubstance use and poor medical follow up.   Additional history obtained:  Additional history and/or information obtained from EMS. External records from outside source obtained and reviewed including records from prior hospitalizations, including hospitalization for altered mental status in setting of opiate overdose.    Lab Tests:  I Ordered (or co-signed), and personally interpreted labs.  The pertinent results include:   CMP: anion gap metabolic acidosis UA: bacteriuria, hematuria, pyuria UDS: positive for cocaine and benzodiazepines   Imaging Studies ordered:  I ordered (or co-signed) imaging studies including CXR and CT head wo contrast.  I independently visualized and interpreted imaging which showed no acute chest abnormality, no acute intracranial abnormality. I agree with the radiologist interpretation   Cardiac Monitoring:  The patient was maintained on a cardiac monitor.  The cardiac monitored showed an rhythm of NSR. The patient was also maintained on pulse oximetry. The readings were typically within normal limits.   Medicines ordered and prescription drug management:  I ordered medication including NS infusion, ceftriaxone 1 g IV Reevaluation of the patient after these medicines showed that the patient stayed the same  Reevaluation:  After the interventions noted above, I reevaluated the patient and found that they have :stayed the same.     Dispostion:  After consideration of the diagnostic results and the patients response to treatment, I feel that the patent would benefit from admission to the hospital for work-up of altered mental status in the setting of acute cocaine intoxication and urinary tract infection.          Final Clinical Impression(s) / ED Diagnoses Final diagnoses:  None    Rx / DC Orders ED Discharge Orders     None         Nani Gasser, MD 06/05/22 9417    Isla Pence, MD 06/05/22 [phone]

## 2022-06-05 NOTE — H&P (Signed)
Date: 06/05/2022               Patient Name:  Sherry Baker MRN: [id number]  DOB: 1966-06-21 Age / Sex: 56 y.o., female   PCP: Pcp, No         Medical Service: Internal Medicine Teaching Service         Attending Physician: Dr. Sid Falcon, MD    First Contact: Dr. Johny Blamer Pager: [phone]  Second Contact: Dr. Ileana Ladd Pager: [phone]       After Hours (After 5p/  First Contact Pager: [phone]  weekends / holidays): Second Contact Pager: [phone]   Chief concern: Acute encephalopathy  History of Present Illness:   Sherry Baker is a 56 year old female with past medical history of rectal cancer status post APR and colostomy in 2018, polysubstance use disorder, presence of sacral decubitus ulcer, who was brought to the hospital due to erratic behavior, rolling around on the grass with urine incontinence.  On route to the hospital, patient received 5 mg of Versed due to aggression.  When evaluated bedside, patient was sedated due to high-dose of Versed.  She responded to pain stimuli and sternal rub.    More information was obtained from patient's cousin [employer].  She was not aware of patient's admission.  She states that patient lives in a  "crack house" and she is using many substances including cocaine, PCP, barbiturates and some hallucinogen.  Sherry Baker states that patient drinks alcohol daily, about 4-540 ounces of beer a day. She states that patient usually get her check of the month and spend on substances.  She states that patient typically gets admitted to the hospital on the 4th or 5th a month due to this behavior.  She the patient was recently admitted and intubated for MRSA pneumonia.  Patient was stated that patient does not have any other family area.  She has not seen the patient for 1 month because of the unsafe environment that patient is living in.  They have not talked because patient's phone number has changed.  She expressed frustration with the  vicious cycle where patient will only get admitted for substance use and then discharged back to when she was.  They have tried hospice and rehab facility but they would not accept patient due to substance use disorder and history of cancer.  Patient was recently admitted in 5/3 for acute encephalopathy from sepsis and Enterococcus bacteremia.  She was readmitted on 6/3 for acute encephalopathy from opioids overdose responded to Narcan.  She was seen in the emergency room again on 6/22 for erratic behavior.  In the ED, patient was hypertensive with mild hypothermic 96.6.  CT head did not show any acute abnormality.  UDS was positive for cocaine and benzodiazepine.  Ethanol level was undetectable.  She also has an AKI with anion gap metabolic on BMP.  Meds:  No outpatient medications have been marked as taking for the 06/05/22 encounter [facility] Encounter).    Allergies: Allergies as of 06/05/2022 - Review Complete 06/05/2022  Allergen Reaction Noted   Penicillins Hives and Swelling 06/06/2019   Past Medical History:  Diagnosis Date   Acquired hypothyroidism    Alcohol abuse    Allergy    PCNS swelling   Arthritis    Bipolar 1 disorder ([place])    Cancer ([place]) 01/21/2017   rectal cancer   Cancer (HCC)    Chronic kidney disease    Chronic pain syndrome  Cirrhosis ([place])    Cirrhosis of liver ([place])    Cocaine abuse ([place])    COVID-19 virus infection 06/23/2021   Delirium tremens ([place]) 06/22/2021   Depression    Genetic testing 03/24/2017   Ms. Cuoco underwent genetic counseling and testing for hereditary cancer syndromes on 02/17/2017. Her results were negative for mutations in all 46 genes analyzed by [facility]'s 46-gene Common Hereditary Cancers Panel. Genes analyzed include: APC, ATM, AXIN2, BARD1, BMPR1A, BRCA1, BRCA2, BRIP1, CDH1, CDKN2A, CHEK2, CTNNA1, DICER1, EPCAM, GREM1, HOXB13, KIT, MEN1, MLH1, MSH2, MSH3, MSH6, MUTYH, NBN,   HTN (hypertension)    Hypertension    Polysubstance  abuse ([place])    Rectal cancer ([place])    Suicidal ideation 02/11/2014    Family History:  Family History  Problem Relation Age of Onset   Cancer Mother 82       Originating in the abdomen, otherwise unknwon   Cancer - Other Maternal Aunt        Throat     Social History:  Lives by herself Drink alcohol daily with polysubstance use PCP: Freeman Caldron at [employer] [facility].  Last seen 01/2022  Review of Systems: A complete ROS was negative except as per HPI.   Physical Exam: Blood pressure (!) 166/88, pulse 93, temperature (!) 96.6 F (35.9 C), temperature source Temporal, resp. rate 19, height _0  (1.575 m), weight 49.2 kg, SpO2 98 %. Physical Exam Constitutional:      Comments: Patient is sedated.  Responds to painful stimuli and sternal rub. Patient is chronically ill-appearing and cachectic  HENT:     Head: Normocephalic.     Mouth/Throat:     Mouth: Mucous membranes are dry.  Eyes:     Pupils: Pupils are equal, round, and reactive to light.  Cardiovascular:     Rate and Rhythm: Normal rate and regular rhythm.  Pulmonary:     Effort: Pulmonary effort is normal.     Comments: Normal lung sounds Abdominal:     General: There is no distension.     Palpations: Abdomen is soft.     Comments: Ostomy bag in the left lower quadrant.  Site appears clean and noninfected.  Skin:    General: Skin is warm.     Comments: Multiple blister noted on her right foot.  See picture  Neurological:     Comments: Sedated.      EKG: personally reviewed my interpretation is sinus tachycardia, LVH  CXR: personally reviewed my interpretation is unremarkable  Assessment & Plan by Problem: Principal Problem:   Acute encephalopathy Active Problems:   Rectal cancer ([place])   Polysubstance abuse with associated depression and anxiety   Alcohol use disorder, severe, dependence (HCC)   AKI (acute kidney injury) ([place])   Chronic Sacral wound, subsequent encounter   UTI (urinary  tract infection)   High anion gap metabolic acidosis  Sherry Baker is a 56 year old female with past medical history of rectal cancer status post APR and colostomy in 2018, polysubstance use disorder, presence of sacral decubitus ulcer, who was admitted to the hospital for acute encephalopathy, secondary to possible drug overdose vs alcohol withdrawal vs underlying infection.  Acute encephalopathy Polysubstance use disorder Alcohol use disorder with history of DT Patient has been seen in the hospital before for similar erratic behavior.  The most likely cause is due to substance overdose (cocaine and benzo).  She is also at risk for alcohol withdrawal with past DT.  Her underlying UTI may play a  role.  At this time she does not have strong evidence for sepsis. Patient is currently sedated due to high dose of Versed.  She is hemodynamically stable and protecting her airway at this time but at risk for aspiration.  -CIWA with Ativan protocol.  Thiamine and folate.  Patient is at high risk for ICU admission due to her past DT. -Pending blood culture and urine culture -TOC consult for substance use  AKI Creatine 1.9 with baseline around 1.2, GFR 31.  Prerenal vs UTI.  She has received 1 L bolus of normal saline. -Obtain renal ultrasound to rule out obstruction -Continue maintenance fluid NS 125 cc/h x 8 hours -IV ceftriaxone for UTI -Repeat BMP at noon  Anion gap metabolic acidosis Ruled out lactic acidosis.  This is in the setting of AKI and possible starvation ketosis in the setting of alcohol use disorder.  Evidence of ketone in the UA. -Continue fluids -Recheck BMP  UTI UA has large leukocytes, and pyuria. -Pending urine culture -Continue IV ceftriaxone for now.  Her past urine culture in 6/20 grew Proteus.  Rectal cancer status post APR with colostomy 2018 Patient had a schedule appointment with surgery on 8/4.  We will need to to reschedule that. -Wound care consult for  ostomy  Chronic sacral decubitus ulcer Stage IV decubitus ulcer.  Does not look grossly infected but she at risk for infection. -Wound care consult -Continue ceftriaxone for now -Blood culture pending  Full code Diet: N.p.o. IVF: Normal saline 125 cc/h x 8 DVT: Lovenox  Dispo: Admit patient to Inpatient with expected length of stay greater than 2 midnights.  SignedGaylan Gerold, DO 06/05/2022, 10:45 AM  Pager: [phone] After 5pm on weekdays and 1pm on weekends: On Call pager: [phone]

## 2022-06-05 NOTE — ED Notes (Signed)
Pt still sleeping since she arrived.

## 2022-06-05 NOTE — ED Triage Notes (Signed)
Patient presents to [facility] states they were called out for poss. Overdose, upon arrival states patient was walking like a zombie, incont of urine patient has an ostomy bag. Patient has a sacral ulcer. Patient was given Versed 5 mg IM upon arrival patient is unresp. With nasopharyngeal airway

## 2022-06-05 NOTE — Progress Notes (Addendum)
Upon reevaluation at 11 AM, patient is now awake and agitated.  Unable to redirect verbally.  Patient was trying to pull out her IVs and climbing out of her bed.  1 mg of Ativan was administered and was able to calm patient down.  She is at high risk for ICU admission for alcohol withdrawal past DT.  We will monitor her status closely.

## 2022-06-06 ENCOUNTER — Inpatient Hospital Stay (HOSPITAL_COMMUNITY): Payer: Medicaid Other

## 2022-06-06 DIAGNOSIS — E8721 Acute metabolic acidosis: Secondary | ICD-10-CM | POA: Diagnosis not present

## 2022-06-06 DIAGNOSIS — L89154 Pressure ulcer of sacral region, stage 4: Secondary | ICD-10-CM | POA: Diagnosis not present

## 2022-06-06 DIAGNOSIS — F1090 Alcohol use, unspecified, uncomplicated: Secondary | ICD-10-CM | POA: Diagnosis not present

## 2022-06-06 DIAGNOSIS — G9349 Other encephalopathy: Secondary | ICD-10-CM | POA: Diagnosis not present

## 2022-06-06 DIAGNOSIS — E870 Hyperosmolality and hypernatremia: Secondary | ICD-10-CM

## 2022-06-06 DIAGNOSIS — E876 Hypokalemia: Secondary | ICD-10-CM

## 2022-06-06 LAB — CBC
HCT: 32.3 % — ABNORMAL LOW (ref 36.0–46.0)
Hemoglobin: 10 g/dL — ABNORMAL LOW (ref 12.0–15.0)
MCH: 27.5 pg (ref 26.0–34.0)
MCHC: 31 g/dL (ref 30.0–36.0)
MCV: 89 fL (ref 80.0–100.0)
Platelets: 276 10*3/uL (ref 150–400)
RBC: 3.63 MIL/uL — ABNORMAL LOW (ref 3.87–5.11)
RDW: 16.3 % — ABNORMAL HIGH (ref 11.5–15.5)
WBC: 6.2 10*3/uL (ref 4.0–10.5)
nRBC: 0 % (ref 0.0–0.2)

## 2022-06-06 LAB — BASIC METABOLIC PANEL
Anion gap: 12 (ref 5–15)
Anion gap: 12 (ref 5–15)
BUN: 18 mg/dL (ref 6–20)
BUN: 17 mg/dL (ref 6–20)
CO2: 11 mmol/L — ABNORMAL LOW (ref 22–32)
CO2: 16 mmol/L — ABNORMAL LOW (ref 22–32)
Calcium: 8.7 mg/dL — ABNORMAL LOW (ref 8.9–10.3)
Calcium: 8.7 mg/dL — ABNORMAL LOW (ref 8.9–10.3)
Chloride: 122 mmol/L — ABNORMAL HIGH (ref 98–111)
Chloride: 118 mmol/L — ABNORMAL HIGH (ref 98–111)
Creatinine, Ser: 1.46 mg/dL — ABNORMAL HIGH (ref 0.44–1.00)
Creatinine, Ser: 1.48 mg/dL — ABNORMAL HIGH (ref 0.44–1.00)
GFR, Estimated: 43 mL/min — ABNORMAL LOW (ref >60)
GFR, Estimated: 42 mL/min — ABNORMAL LOW (ref >60)
Glucose, Bld: 57 mg/dL — ABNORMAL LOW (ref 70–99)
Glucose, Bld: 81 mg/dL (ref 70–99)
Potassium: 3.3 mmol/L — ABNORMAL LOW (ref 3.5–5.1)
Potassium: 3.3 mmol/L — ABNORMAL LOW (ref 3.5–5.1)
Sodium: 145 mmol/L (ref 135–145)
Sodium: 146 mmol/L — ABNORMAL HIGH (ref 135–145)

## 2022-06-06 LAB — URINALYSIS, ROUTINE W REFLEX MICROSCOPIC
Bilirubin Urine: NEGATIVE
Glucose, UA: NEGATIVE mg/dL
Ketones, ur: 20 mg/dL — AB
Nitrite: NEGATIVE
Protein, ur: 30 mg/dL — AB
RBC / HPF: >50 RBC/hpf — ABNORMAL HIGH (ref 0–5)
Specific Gravity, Urine: 1.009 (ref 1.005–1.030)
WBC, UA: >50 WBC/hpf — ABNORMAL HIGH (ref 0–5)
pH: 5 (ref 5.0–8.0)

## 2022-06-06 LAB — MRSA NEXT GEN BY PCR, NASAL: MRSA by PCR Next Gen: POSITIVE — AB

## 2022-06-06 LAB — VOLATILES,BLD-ACETONE,ETHANOL,ISOPROP,METHANOL
Acetone, blood: <0.01 g/dL (ref 0.000–0.010)
Ethanol, blood: <0.01 g/dL (ref 0.000–0.010)
Isopropanol, blood: <0.01 g/dL (ref 0.000–0.010)
Methanol, blood: <0.01 g/dL (ref 0.000–0.010)

## 2022-06-06 LAB — ETHYLENE GLYCOL: Ethylene Glycol Lvl: <5 mg/dL

## 2022-06-06 LAB — COMPREHENSIVE METABOLIC PANEL
ALT: 11 U/L (ref 0–44)
AST: 18 U/L (ref 15–41)
Albumin: 2.4 g/dL — ABNORMAL LOW (ref 3.5–5.0)
Alkaline Phosphatase: 78 U/L (ref 38–126)
Anion gap: 13 (ref 5–15)
BUN: 21 mg/dL — ABNORMAL HIGH (ref 6–20)
CO2: 11 mmol/L — ABNORMAL LOW (ref 22–32)
Calcium: 8.5 mg/dL — ABNORMAL LOW (ref 8.9–10.3)
Chloride: 121 mmol/L — ABNORMAL HIGH (ref 98–111)
Creatinine, Ser: 1.55 mg/dL — ABNORMAL HIGH (ref 0.44–1.00)
GFR, Estimated: 40 mL/min — ABNORMAL LOW (ref >60)
Glucose, Bld: 50 mg/dL — ABNORMAL LOW (ref 70–99)
Potassium: 3.7 mmol/L (ref 3.5–5.1)
Sodium: 145 mmol/L (ref 135–145)
Total Bilirubin: 0.9 mg/dL (ref 0.3–1.2)
Total Protein: 6.3 g/dL — ABNORMAL LOW (ref 6.5–8.1)

## 2022-06-06 LAB — NA AND K (SODIUM & POTASSIUM), RAND UR
Potassium Urine: 15 mmol/L
Sodium, Ur: 135 mmol/L

## 2022-06-06 LAB — BLOOD GAS, ARTERIAL
Acid-base deficit: 12.9 mmol/L — ABNORMAL HIGH (ref 0.0–2.0)
Acid-base deficit: 4.8 mmol/L — ABNORMAL HIGH (ref 0.0–2.0)
Bicarbonate: 12.4 mmol/L — ABNORMAL LOW (ref 20.0–28.0)
Bicarbonate: 19.7 mmol/L — ABNORMAL LOW (ref 20.0–28.0)
Drawn by: 28099
Drawn by: 39899
O2 Saturation: 97.5 %
O2 Saturation: 57.2 %
Patient temperature: 37
Patient temperature: 37
pCO2 arterial: 27 mmHg — ABNORMAL LOW (ref 32–48)
pCO2 arterial: 34 mmHg (ref 32–48)
pH, Arterial: 7.27 — ABNORMAL LOW (ref 7.35–7.45)
pH, Arterial: 7.37 (ref 7.35–7.45)
pO2, Arterial: 88 mmHg (ref 83–108)
pO2, Arterial: 31 mmHg — CL (ref 83–108)

## 2022-06-06 LAB — GLUCOSE, CAPILLARY
Glucose-Capillary: 74 mg/dL (ref 70–99)
Glucose-Capillary: 81 mg/dL (ref 70–99)
Glucose-Capillary: 113 mg/dL — ABNORMAL HIGH (ref 70–99)

## 2022-06-06 LAB — CHLORIDE, URINE, RANDOM: Chloride Urine: 141 mmol/L

## 2022-06-06 LAB — OSMOLALITY: Osmolality: 301 mOsm/kg — ABNORMAL HIGH (ref 275–295)

## 2022-06-06 LAB — OSMOLALITY, URINE: Osmolality, Ur: 384 mOsm/kg (ref 300–900)

## 2022-06-06 MED ORDER — CHLORDIAZEPOXIDE HCL 5 MG PO CAPS: 25.0000 mg | ORAL_CAPSULE | Freq: Every day | ORAL | Status: DC

## 2022-06-06 MED ORDER — MUPIROCIN 2 % EX OINT
1.0000 | TOPICAL_OINTMENT | Freq: Two times a day (BID) | CUTANEOUS | Status: AC
  Administered 2022-06-06 – 2022-06-10 (×9): 1 via NASAL
  Filled 2022-06-06 (×2): qty 22

## 2022-06-06 MED ORDER — CHLORDIAZEPOXIDE HCL 5 MG PO CAPS: 25.0000 mg | ORAL_CAPSULE | Freq: Four times a day (QID) | ORAL | Status: DC

## 2022-06-06 MED ORDER — CHLORHEXIDINE GLUCONATE CLOTH 2 % EX PADS
6.0000 | MEDICATED_PAD | Freq: Every day | CUTANEOUS | Status: AC
  Administered 2022-06-07 – 2022-06-10 (×5): 6 via TOPICAL

## 2022-06-06 MED ORDER — CHLORDIAZEPOXIDE HCL 5 MG PO CAPS: 25.0000 mg | ORAL_CAPSULE | Freq: Three times a day (TID) | ORAL | Status: DC

## 2022-06-06 MED ORDER — IOHEXOL 9 MG/ML PO SOLN
ORAL | Status: AC
  Filled 2022-06-06: qty 1000

## 2022-06-06 MED ORDER — LACTATED RINGERS IV SOLN
INTRAVENOUS | Status: DC
Start: 2022-06-06 — End: 2022-06-09

## 2022-06-06 MED ORDER — ORAL CARE MOUTH RINSE
15.0000 mL | OROMUCOSAL | Status: DC
  Administered 2022-06-06 – 2022-06-07 (×3): 15 mL via OROMUCOSAL

## 2022-06-06 MED ORDER — DEXTROSE-NACL 5-0.9 % IV SOLN: INTRAVENOUS | Status: DC

## 2022-06-06 MED ORDER — LOPERAMIDE HCL 2 MG PO CAPS: 2.0000 mg | ORAL_CAPSULE | ORAL | Status: AC | PRN

## 2022-06-06 MED ORDER — CHLORDIAZEPOXIDE HCL 5 MG PO CAPS: 25.0000 mg | ORAL_CAPSULE | ORAL | Status: DC

## 2022-06-06 MED ORDER — IOHEXOL 300 MG/ML  SOLN
75.0000 mL | Freq: Once | INTRAMUSCULAR | Status: AC | PRN
  Administered 2022-06-06: 75 mL via INTRAVENOUS

## 2022-06-06 MED ORDER — POTASSIUM CHLORIDE 10 MEQ/100ML IV SOLN
10.0000 meq | INTRAVENOUS | Status: AC
  Administered 2022-06-06 – 2022-06-07 (×4): 10 meq via INTRAVENOUS
  Filled 2022-06-06 (×4): qty 100

## 2022-06-06 MED ORDER — DIAZEPAM 5 MG/ML IJ SOLN
10.0000 mg | Freq: Three times a day (TID) | INTRAMUSCULAR | Status: AC
Start: 2022-06-06 — End: 2022-06-06
  Administered 2022-06-06 (×2): 10 mg via INTRAVENOUS
  Filled 2022-06-06 (×2): qty 2

## 2022-06-06 MED ORDER — HYDROXYZINE HCL 25 MG PO TABS: 25.0000 mg | ORAL_TABLET | Freq: Four times a day (QID) | ORAL | Status: DC | PRN

## 2022-06-06 MED ORDER — SODIUM BICARBONATE 8.4 % IV SOLN
INTRAVENOUS | Status: DC
  Filled 2022-06-06 (×3): qty 1000

## 2022-06-06 MED ORDER — ONDANSETRON 4 MG PO TBDP: 4.0000 mg | ORAL_TABLET | Freq: Four times a day (QID) | ORAL | Status: DC | PRN

## 2022-06-06 MED ORDER — ORAL CARE MOUTH RINSE: 15.0000 mL | OROMUCOSAL | Status: DC | PRN

## 2022-06-06 NOTE — Significant Event (Addendum)
Received message from respiratory therapist stating that the ABG at 5 PM was a mixed venous sample so the PaO2 of 31 is not accurate but otherwise the report is good.  Patient was on continuous pulse ox and we were in the room during the ABG draw and there was no noted desaturation before during or after.

## 2022-06-06 NOTE — Progress Notes (Signed)
Pt confused and unable to participate in Cage Aide or d/c planning at this time.  TOC will follow.

## 2022-06-06 NOTE — Progress Notes (Signed)
HD#1 Subjective:   Summary: Patient was lying in bed sleeping.  Awoken with sternal rub she was slow to open her eyes and was never fully alert.  She appeared to be complaining of some pain but was not able to verbalize her concerns.  She quickly fell back asleep several times while we are in the room.  Overnight Events: Required 2 milligrams IV lorazepam at about 1 AM for CIWA of 8.  Repeat CIWA in the morning was 0.     Objective:  Vital signs in last 24 hours: Vitals:   06/05/22 1945 06/06/22 0012 06/06/22 0422 06/06/22 0733  BP: (!) 167/77 (!) 144/74 (!) 169/89 (!) 160/77  Pulse: 78 67 76 66  Resp: '16 19 20 20  '$ Temp: 98.3 F (36.8 C) 97.8 F (36.6 C) 98.5 F (36.9 C) 97.8 F (36.6 C)  TempSrc: Axillary Axillary Axillary Axillary  SpO2: 97% 100% 100% 99%  Weight:      Height:       Supplemental O2: Room Air SpO2: 99 %   Physical Exam:  Constitutional: Sleeping female who appears older than stated age, overall appears uncomfortable HENT: normocephalic atraumatic Abdominal: soft, non-distended.  Ostomy in left lower quadrant without any apparent leaks, bleeding, or surrounding edema/erythema.  Small fistula like hole in the suprapubic region draining what appears to be urine. Neurological: Somnolent but arousable to sternal rub   Filed Weights   06/05/22 0706  Weight: 49.2 kg     Intake/Output Summary (Last 24 hours) at 06/06/2022 1108 Last data filed at 06/05/2022 2359 Gross per 24 hour  Intake 600 ml  Output --  Net 600 ml   Net IO Since Admission: 600 mL [06/06/22 1108]  Pertinent Labs:    Latest Ref Rng & Units 06/06/2022    5:37 AM 06/05/2022    7:14 AM 06/05/2022    7:07 AM  CBC  WBC 4.0 - 10.5 K/uL 6.2   5.7   Hemoglobin 12.0 - 15.0 g/dL 10.0  11.9  11.1   Hematocrit 36.0 - 46.0 % 32.3  35.0  36.2   Platelets 150 - 400 K/uL 276   317        Latest Ref Rng & Units 06/06/2022    5:37 AM 06/05/2022    1:00 PM 06/05/2022    7:14 AM  CMP  Glucose 70 -  99 mg/dL 50  59    BUN 6 - 20 mg/dL 21  27    Creatinine 0.44 - 1.00 mg/dL 1.55  1.58    Sodium 135 - 145 mmol/L 145  142  137   Potassium 3.5 - 5.1 mmol/L 3.7  3.7  4.5   Chloride 98 - 111 mmol/L 121  116    CO2 22 - 32 mmol/L 11  13    Calcium 8.9 - 10.3 mg/dL 8.5  7.9    Total Protein 6.5 - 8.1 g/dL 6.3     Total Bilirubin 0.3 - 1.2 mg/dL 0.9     Alkaline Phos 38 - 126 U/L 78     AST 15 - 41 U/L 18     ALT 0 - 44 U/L 11       Imaging: No results found.  Assessment/Plan:   Principal Problem:   Acute encephalopathy Active Problems:   Rectal cancer (HCC)   Polysubstance abuse with associated depression and anxiety   Alcohol use disorder, severe, dependence ([place])   AKI (acute kidney injury) ([place])   Chronic Sacral wound,  subsequent encounter   UTI (urinary tract infection)   High anion gap metabolic acidosis   Patient Summary: BETHANN QUALLEY is a 56 y.o. with a pertinent PMH of colon cancer status post colostomy in 2018, polysubstance abuse, and sacral decubitus ulcer who presented with erratic behavior and admitted for acute encephalopathy.   Acute encephalopathy Polysubstance use disorder Alcohol use disorder with history of DT Patient has been seen in the hospital before for similar erratic behavior.  The most likely cause is due to substance overdose (cocaine and benzo) and/or withdrawals.  She is also at risk for alcohol withdrawal with past DT.  Possible infectious cause with multiple possible routes of infection including urinary, wound, IV drug use. She remains hemodynamically stable although she is sleeping a large amount of time likely due to the as needed Ativan that she has required. -CIWA with Ativan protocol.  Thiamine and folate -Pending blood culture and urine culture -TOC consult for substance use -Valium taper started today  Anion gap metabolic acidosis with concomitant non-anion anion gap metabolic acidosis ABG pH of 7.27 with a bicarb of 12.4.  Positive  urine anion gap, negative osmolar gap.  Lactic acid of 1.1, negative ethanol, negative salicylates.  Renal ultrasound showed left hydronephrosis and did not visualize the right kidney.  Anion gap originally 16 now down to 12.  This is likely multifactorial and could indicate ingestion or possibly early renal failure.  Labs pending include ethylene glycol, methanol, polyethylene glycol. Repeat ABG and BMP this afternoon showed improvement of acidosis with a pH of 7.37 and a bicarb of 19.7. -Continue fluids with LR at 125 an hour  AKI - Creatine 1.9 with baseline around 1.2, GFR 31.  Creatinine has steadily fallen to 1.48. - We will continue to monitor with BMPs.  UTI UA has large leukocytes, and pyuria.  Urine appears to be draining from a fistula in the suprapubic region.  There is no history of suprapubic catheters that we can find. -Pending urine culture and CT abdomen pelvis with contrast -Continue IV ceftriaxone for now.  Her past urine culture in 6/20 grew Proteus. -Consider consulting urology after CT results  Hypokalemia Initial potassium 4.5 is dropped to 3.3 after fluid administration over 24+ hours.  This may be a part of her acidosis but is unclear at this point. -Replete potassium with IV potassium  Hypernatremia Initial sodium of 138 has risen to 146 over the past 34 hours. -Switch sodium bicarb/D5 with LR at 125 an hour   Rectal cancer status post APR with colostomy 2018 Patient had a schedule appointment with surgery on 8/4.  We will need to to reschedule that. -Wound care consult for ostomy   Chronic sacral decubitus ulcer Stage IV decubitus ulcer.  Does not look grossly infected but she at risk for infection. -Wound care consult -Continue ceftriaxone for now -Blood culture pending  Diet: NPO IVF:  LR ,125cc/hr VTE: Enoxaparin Code: Full PT/OT recs: None, none. TOC recs: Pending    Dispo: Disposition unclear at this point we will continue to assess as we  evaluate and treat.  [place] [facility] [place] [facility] Resident PGY-1 Pager: [phone]  Please contact the on call pager after 5 pm and on weekends at [phone].

## 2022-06-06 NOTE — Progress Notes (Signed)
Patient with increased agitation kicking out legs and moaning. Oral suctioning attempted multiple times for secretions retained in mouth. Tongue is very dry and hard to suction around. Oral moisturizer applied but patient very agitated.  IV for CT attempted but unsuccessful.

## 2022-06-06 NOTE — Hospital Course (Addendum)
Zeynab Klett is a 56 year old female with a past medical history of colon cancer status post colostomy, polysubstance/alcohol use disorder, and sacral decubitus ulcer who presented on 8/3 with erratic behavior and was admitted for acute encephalopathy and alcohol/polysubstance withdrawal.  She was transferred to the ICU and intubated on 8/5, extubated and off Precedex drip on 8/7, transferred out of the ICU on 8/10.   Acute encephalopathy Polysubstance use disorder Alcohol use disorder with history of DT Patient presented to the ED via EMS with erratic behavior. She had a similar admission 04/2022. On admission she was essentially non-verbal and would fluctuate between agitated, yelling, and rocking in her bed to sedated. Over the first 2 days she showed worsening agitation and withdrawal symptoms with concerns for airway protection. With her worsening status and prior history of DT we consulted PCCM. She was transferred to ICU on 8/5, intubated and treated with precedex, phenobarbital, seroquel and librium. Extubated and off precedex drip on 8/7. She was then transferred out of the ICU on 8/10. She showed steady improvement following ICU care and completion of phenobarbital taper with vastly improved mentation. We extended the librium taper by 2 days due to persistent CIWA >5. She completed the librium taper on 8/13 and she was continued on seroquel 50 mg twice daily.  Patient will be discharged on Zoloft 50 mg daily.   Azotemia Concerned for GI bleed (ulcer vs recurrent malignancy) given multiple risk factors including gastritis 2/2 EtOH use, colon cancer, and recent ICU stay with intubation and no other obvious source of bleeding. BUN has precipitously risen over 4-5 days following ICU stay up to 76 with an initially stable creatinine but eventually rose after 4 days to 1.39.  Creatinine is at baseline now.  Hemoglobin showed a delayed decrease from 11.1 to 9.1 in one day then she was stable at 9.   Patient continues to be stable at 9.5.  No subjective signs of bleed.  No blood or dark tarry stools noted in colostomy bag.  Patient will be followed by GI outpatient.  Patient to continue pantoprazole 40 mg twice daily. GI consulted and recommended outpatient follow up for colonoscopy but no immediate intervention without clear signs of GI bleed.   ESBL Klebsiella UTI UC with >100K ESBL klebsiella pneumoniae. She completed 7 days of meropenem on 8/11. Remove foley on 8/13 with no signs of retention after removal.   Viscerocutaneous fistula  Concern for suprapubic fistula, was supposed to follow up with general surgery for this but was admitted. Now concerning for vesiculocutaneous fistula CT on 8/4 without definite fistula.  Was originally draining urine but while foley catheter was in and patient was mentating well there was no output. She will need follow up outpatient with surgery and/or urology.   Chronic sacral decubitus ulcer Stage IV decubitus ulcer remained clean and care was coordinated by wound care. There we no signs of acute infection of her would during admission.    AKI Admission creatinine of 1.9 with a baseline of 1. AKI improved with IV fluids rapidly and she remained near baseline until BUN rise as above.   AGMA with concomitant NAGMA Prior to ICU transfer her ABG pH of 7.27 with a bicarb of 12.4.  Positive urine anion gap, negative osmolar gap.  Lactic acid of 1.1, negative ethanol, negative salicylates.  Renal ultrasound showed left hydronephrosis and did not visualize the right kidney.  Anion gap originally 16 now down to 12. Repeat ABG and BMP this showed improvement of  acidosis with a pH of 7.37 and a bicarb of 19.7.  Ingestion labs were negative for ethylene glycol, methanol, acetone, isopropyl alcohol, ethanol.  This was likely multifactorial and could indicate ingestion or possibly early renal failure.   Iron Deficiency Anemia Baseline hemoglobin appears to be around 11  and she has been around baseline the majority of her admission.  She has not had a colonoscopy since 2018 when she was diagnosed with colon cancer.  Hemoglobin dropped to 9.1 today from 11.1.  Iron studies show iron of 57 and ferritin of 131 which is likely elevated in the setting of inflammation.  We started ferrous sulfate 325 daily and bowel regimen prn. Workup as above.   Depression As she showed improved mentation she described feelings of depression about her life and health. She has had these feelings before and has seen someone about it outpatient. Prescribed Zoloft with benefit in the past, per patient. Recent abdominal imaging does not show any signs of cirrhosis. We started her on  Zoloft 50 mg daily with benefit which she will be discharged with along with Buspar.   Rectal cancer status post APR with colostomy 2018 No colonoscopy since diagnosis. High risk for GI ulcer as well as recurrent malignancy. Workup and course as above.   Malnutrition Cortrack placed in ICU. After a couple days out of ICU she was  requesting food. SLP consulted and patient was started on dysphagia diet. NG tube was removed on 8/12 with adequate PO intake.    Goals of Care She was made a partial code during this admission but as she was mentating better and she was previously a full code we will have further discussions about goals of care with her.

## 2022-06-07 ENCOUNTER — Inpatient Hospital Stay (HOSPITAL_COMMUNITY): Payer: Medicaid Other

## 2022-06-07 DIAGNOSIS — R319 Hematuria, unspecified: Secondary | ICD-10-CM

## 2022-06-07 DIAGNOSIS — L89159 Pressure ulcer of sacral region, unspecified stage: Secondary | ICD-10-CM

## 2022-06-07 DIAGNOSIS — K316 Fistula of stomach and duodenum: Secondary | ICD-10-CM

## 2022-06-07 DIAGNOSIS — F1090 Alcohol use, unspecified, uncomplicated: Secondary | ICD-10-CM | POA: Diagnosis not present

## 2022-06-07 DIAGNOSIS — N390 Urinary tract infection, site not specified: Secondary | ICD-10-CM | POA: Diagnosis not present

## 2022-06-07 DIAGNOSIS — F14129 Cocaine abuse with intoxication, unspecified: Secondary | ICD-10-CM

## 2022-06-07 DIAGNOSIS — G9340 Encephalopathy, unspecified: Secondary | ICD-10-CM

## 2022-06-07 DIAGNOSIS — N179 Acute kidney failure, unspecified: Secondary | ICD-10-CM | POA: Diagnosis not present

## 2022-06-07 DIAGNOSIS — F1990 Other psychoactive substance use, unspecified, uncomplicated: Secondary | ICD-10-CM | POA: Diagnosis not present

## 2022-06-07 LAB — POCT I-STAT 7, (LYTES, BLD GAS, ICA,H+H)
Acid-Base Excess: 6 mmol/L — ABNORMAL HIGH (ref 0.0–2.0)
Bicarbonate: 28.1 mmol/L — ABNORMAL HIGH (ref 20.0–28.0)
Calcium, Ion: 1.14 mmol/L — ABNORMAL LOW (ref 1.15–1.40)
HCT: 35 % — ABNORMAL LOW (ref 36.0–46.0)
Hemoglobin: 11.9 g/dL — ABNORMAL LOW (ref 12.0–15.0)
O2 Saturation: 98 %
Patient temperature: 98.3
Potassium: 2.8 mmol/L — ABNORMAL LOW (ref 3.5–5.1)
Sodium: 139 mmol/L (ref 135–145)
TCO2: 29 mmol/L (ref 22–32)
pCO2 arterial: 29.9 mmHg — ABNORMAL LOW (ref 32–48)
pH, Arterial: 7.581 — ABNORMAL HIGH (ref 7.35–7.45)
pO2, Arterial: 91 mmHg (ref 83–108)

## 2022-06-07 LAB — BASIC METABOLIC PANEL
Anion gap: 10 (ref 5–15)
Anion gap: 7 (ref 5–15)
BUN: 10 mg/dL (ref 6–20)
BUN: 5 mg/dL — ABNORMAL LOW (ref 6–20)
CO2: 23 mmol/L (ref 22–32)
CO2: 28 mmol/L (ref 22–32)
Calcium: 8.4 mg/dL — ABNORMAL LOW (ref 8.9–10.3)
Calcium: 8.3 mg/dL — ABNORMAL LOW (ref 8.9–10.3)
Chloride: 109 mmol/L (ref 98–111)
Chloride: 104 mmol/L (ref 98–111)
Creatinine, Ser: 1.32 mg/dL — ABNORMAL HIGH (ref 0.44–1.00)
Creatinine, Ser: 1.05 mg/dL — ABNORMAL HIGH (ref 0.44–1.00)
GFR, Estimated: 48 mL/min — ABNORMAL LOW (ref >60)
GFR, Estimated: >60 mL/min (ref >60)
Glucose, Bld: 117 mg/dL — ABNORMAL HIGH (ref 70–99)
Glucose, Bld: 105 mg/dL — ABNORMAL HIGH (ref 70–99)
Potassium: 3.2 mmol/L — ABNORMAL LOW (ref 3.5–5.1)
Potassium: 2.7 mmol/L — CL (ref 3.5–5.1)
Sodium: 142 mmol/L (ref 135–145)
Sodium: 139 mmol/L (ref 135–145)

## 2022-06-07 LAB — GLUCOSE, CAPILLARY
Glucose-Capillary: 114 mg/dL — ABNORMAL HIGH (ref 70–99)
Glucose-Capillary: 100 mg/dL — ABNORMAL HIGH (ref 70–99)
Glucose-Capillary: 103 mg/dL — ABNORMAL HIGH (ref 70–99)
Glucose-Capillary: 104 mg/dL — ABNORMAL HIGH (ref 70–99)
Glucose-Capillary: 108 mg/dL — ABNORMAL HIGH (ref 70–99)

## 2022-06-07 LAB — MAGNESIUM
Magnesium: 1.5 mg/dL — ABNORMAL LOW (ref 1.7–2.4)
Magnesium: 2.3 mg/dL (ref 1.7–2.4)

## 2022-06-07 LAB — URINE CULTURE: Culture: >=100000 — AB

## 2022-06-07 LAB — PHOSPHORUS: Phosphorus: 1 mg/dL — CL (ref 2.5–4.6)

## 2022-06-07 MED ORDER — PHENOBARBITAL SODIUM 130 MG/ML IJ SOLN
130.0000 mg | Freq: Once | INTRAMUSCULAR | Status: AC
  Administered 2022-06-07: 130 mg via INTRAVENOUS
  Filled 2022-06-07: qty 1

## 2022-06-07 MED ORDER — PHENOBARBITAL 32.4 MG PO TABS
97.2000 mg | ORAL_TABLET | Freq: Once | ORAL | Status: DC
Start: 2022-06-07 — End: 2022-06-07

## 2022-06-07 MED ORDER — VITAL HIGH PROTEIN PO LIQD: 1000.0000 mL | ORAL | Status: DC

## 2022-06-07 MED ORDER — ETOMIDATE 2 MG/ML IV SOLN
INTRAVENOUS | Status: AC
  Administered 2022-06-07: 20 mg
  Filled 2022-06-07: qty 10

## 2022-06-07 MED ORDER — LORAZEPAM 2 MG/ML IJ SOLN
1.0000 mg | INTRAMUSCULAR | Status: DC | PRN
  Filled 2022-06-07: qty 1

## 2022-06-07 MED ORDER — LORAZEPAM 1 MG PO TABS: 1.0000 mg | ORAL_TABLET | ORAL | Status: DC | PRN

## 2022-06-07 MED ORDER — VANCOMYCIN HCL IN DEXTROSE 1-5 GM/200ML-% IV SOLN
1000.0000 mg | Freq: Once | INTRAVENOUS | Status: DC
Start: 2022-06-07 — End: 2022-06-07

## 2022-06-07 MED ORDER — DEXMEDETOMIDINE HCL IN NACL 400 MCG/100ML IV SOLN
0.0000 ug/kg/h | INTRAVENOUS | Status: DC
  Administered 2022-06-07 – 2022-06-08 (×2): 0.4 ug/kg/h via INTRAVENOUS
  Filled 2022-06-07 (×2): qty 100

## 2022-06-07 MED ORDER — LORAZEPAM 2 MG/ML IJ SOLN: 1.0000 mg | INTRAMUSCULAR | Status: DC | PRN

## 2022-06-07 MED ORDER — MAGNESIUM SULFATE 2 GM/50ML IV SOLN
2.0000 g | Freq: Once | INTRAVENOUS | Status: AC
Start: 2022-06-07 — End: 2022-06-07
  Administered 2022-06-07: 2 g via INTRAVENOUS
  Filled 2022-06-07: qty 50

## 2022-06-07 MED ORDER — FENTANYL CITRATE PF 50 MCG/ML IJ SOSY
50.0000 ug | PREFILLED_SYRINGE | INTRAMUSCULAR | Status: DC | PRN
  Administered 2022-06-07: 100 ug via INTRAVENOUS
  Administered 2022-06-08: 50 ug via INTRAVENOUS
  Administered 2022-06-08 – 2022-06-09 (×6): 100 ug via INTRAVENOUS
  Filled 2022-06-07 (×8): qty 2
  Filled 2022-06-07: qty 1

## 2022-06-07 MED ORDER — ORAL CARE MOUTH RINSE: 15.0000 mL | OROMUCOSAL | Status: DC | PRN

## 2022-06-07 MED ORDER — ORAL CARE MOUTH RINSE
15.0000 mL | OROMUCOSAL | Status: DC
  Administered 2022-06-07 – 2022-06-09 (×22): 15 mL via OROMUCOSAL

## 2022-06-07 MED ORDER — PHENOBARBITAL SODIUM 65 MG/ML IJ SOLN: 65.0000 mg | Freq: Three times a day (TID) | INTRAMUSCULAR | Status: DC

## 2022-06-07 MED ORDER — VANCOMYCIN HCL 1250 MG/250ML IV SOLN
1250.0000 mg | INTRAVENOUS | Status: DC
  Administered 2022-06-07: 1250 mg via INTRAVENOUS
  Filled 2022-06-07: qty 250

## 2022-06-07 MED ORDER — POTASSIUM PHOSPHATES 15 MMOLE/5ML IV SOLN
30.0000 mmol | Freq: Once | INTRAVENOUS | Status: AC
  Administered 2022-06-07: 30 mmol via INTRAVENOUS
  Filled 2022-06-07: qty 10

## 2022-06-07 MED ORDER — POTASSIUM CHLORIDE 10 MEQ/100ML IV SOLN
10.0000 meq | INTRAVENOUS | Status: AC
  Administered 2022-06-07 (×4): 10 meq via INTRAVENOUS
  Filled 2022-06-07 (×4): qty 100

## 2022-06-07 MED ORDER — DIAZEPAM 5 MG/ML IJ SOLN
10.0000 mg | Freq: Three times a day (TID) | INTRAMUSCULAR | Status: DC
Start: 2022-06-07 — End: 2022-06-07
  Administered 2022-06-07: 10 mg via INTRAVENOUS
  Filled 2022-06-07: qty 2

## 2022-06-07 MED ORDER — PANTOPRAZOLE 2 MG/ML SUSPENSION
40.0000 mg | Freq: Every day | ORAL | Status: DC
  Administered 2022-06-07 – 2022-06-08 (×2): 40 mg
  Filled 2022-06-07 (×3): qty 20

## 2022-06-07 MED ORDER — ENOXAPARIN SODIUM 40 MG/0.4ML IJ SOSY
40.0000 mg | PREFILLED_SYRINGE | INTRAMUSCULAR | Status: DC
  Administered 2022-06-08 – 2022-06-16 (×9): 40 mg via SUBCUTANEOUS
  Filled 2022-06-07 (×9): qty 0.4

## 2022-06-07 MED ORDER — PHENOBARBITAL SODIUM 65 MG/ML IJ SOLN: 32.5000 mg | Freq: Three times a day (TID) | INTRAMUSCULAR | Status: DC

## 2022-06-07 MED ORDER — PHENOBARBITAL SODIUM 65 MG/ML IJ SOLN
65.0000 mg | Freq: Once | INTRAMUSCULAR | Status: AC
  Administered 2022-06-07: 65 mg via INTRAVENOUS

## 2022-06-07 MED ORDER — DIAZEPAM 5 MG/ML IJ SOLN: 8.0000 mg | Freq: Three times a day (TID) | INTRAMUSCULAR | Status: DC

## 2022-06-07 MED ORDER — SODIUM CHLORIDE 0.9 % IV SOLN
1.0000 mg | Freq: Once | INTRAVENOUS | Status: AC
  Administered 2022-06-07: 1 mg via INTRAVENOUS
  Filled 2022-06-07: qty 0.2

## 2022-06-07 MED ORDER — FENTANYL CITRATE PF 50 MCG/ML IJ SOSY
50.0000 ug | PREFILLED_SYRINGE | INTRAMUSCULAR | Status: DC | PRN
  Administered 2022-06-08 – 2022-06-09 (×2): 50 ug via INTRAVENOUS
  Filled 2022-06-07: qty 1

## 2022-06-07 MED ORDER — LORAZEPAM 2 MG/ML IJ SOLN
INTRAMUSCULAR | Status: AC
  Administered 2022-06-07: 2 mg
  Filled 2022-06-07: qty 1

## 2022-06-07 MED ORDER — JUVEN PO PACK
1.0000 | PACK | Freq: Two times a day (BID) | ORAL | Status: DC
  Administered 2022-06-08 – 2022-06-14 (×14): 1
  Filled 2022-06-07 (×14): qty 1

## 2022-06-07 MED ORDER — PHENOBARBITAL SODIUM 130 MG/ML IJ SOLN
97.5000 mg | Freq: Three times a day (TID) | INTRAMUSCULAR | Status: DC
  Administered 2022-06-07: 97.5 mg via INTRAVENOUS
  Filled 2022-06-07 (×2): qty 1

## 2022-06-07 MED ORDER — POTASSIUM CHLORIDE 20 MEQ PO PACK
40.0000 meq | PACK | Freq: Once | ORAL | Status: AC
Start: 2022-06-07 — End: 2022-06-07
  Administered 2022-06-07: 40 meq
  Filled 2022-06-07: qty 2

## 2022-06-07 MED ORDER — ROCURONIUM BROMIDE 10 MG/ML (PF) SYRINGE
PREFILLED_SYRINGE | INTRAVENOUS | Status: AC
  Administered 2022-06-07: 100 mg
  Filled 2022-06-07: qty 10

## 2022-06-07 MED ORDER — POLYETHYLENE GLYCOL 3350 17 G PO PACK
17.0000 g | PACK | Freq: Every day | ORAL | Status: DC
  Administered 2022-06-07: 17 g
  Filled 2022-06-07 (×2): qty 1

## 2022-06-07 MED ORDER — DOCUSATE SODIUM 50 MG/5ML PO LIQD
100.0000 mg | Freq: Two times a day (BID) | ORAL | Status: DC
  Administered 2022-06-07 (×2): 100 mg
  Filled 2022-06-07 (×4): qty 10

## 2022-06-07 MED ORDER — VITAL AF 1.2 CAL PO LIQD
1000.0000 mL | ORAL | Status: DC
  Administered 2022-06-07: 1000 mL
  Filled 2022-06-07: qty 1000

## 2022-06-07 MED ORDER — DIAZEPAM 5 MG/ML IJ SOLN
5.0000 mg | Freq: Three times a day (TID) | INTRAMUSCULAR | Status: DC
Start: 2022-06-09 — End: 2022-06-07

## 2022-06-07 MED ORDER — VANCOMYCIN HCL 1250 MG/250ML IV SOLN: 1250.0000 mg | INTRAVENOUS | Status: DC

## 2022-06-07 MED ORDER — SODIUM CHLORIDE 0.9 % IV SOLN
1.0000 g | Freq: Two times a day (BID) | INTRAVENOUS | Status: AC
  Administered 2022-06-07 – 2022-06-13 (×14): 1 g via INTRAVENOUS
  Filled 2022-06-07 (×14): qty 20

## 2022-06-07 NOTE — Consult Note (Signed)
NAME:  Sherry Baker, MRN:  [id number], DOB:  1966/06/12, LOS: 2 ADMISSION DATE:  06/05/2022, CONSULTATION DATE:  06/07/22 REFERRING MD:  Dr. Nikki Dom, CHIEF COMPLAINT:  ETOH withdrawal   History of Present Illness:  56 year old female with PMH as below significant for polysubstance and daily ETOH abuse with hx of DTs.  Presented 8/3 with erratic behavior, rolling around in the grass with urinary incontinence.    Recent admit 5/3 for acute encephalopathy from enterococcus bacteremia and then on 6/3 for acute encephalopathy from opioid overdose.    Positive for cocaine and benzodiazepine on UDS, ETOH neg, and CTH negative for acute abnormality.  Noted UTI on admit, started on ceftriaxone.  Admitted to IMTS with acute encephalopathy.  She has been persistently encephalopathic and on CIWA protocol, with CIWA 10-12 overnight, getting ativan and valium.  Some concern for airway protection given her oral secretions, requiring NTS overnight.  She remains on room air currently.  PCCM consulted for further assessment.   Pertinent  Medical History  Polysubstance abuse, ETOH abuse with hx of Dts, HFrEF, HTN, IDA, CKD 3b, colon cancer s/p colostomy, bipolar, cirrhosis, chronic sacral decubitus ulcer MRSA PNA  Significant Hospital Events: Including procedures, antibiotic start and stop dates in addition to other pertinent events   8/3 admitted IMTS encephalopathy, UTI, ETOH withdrawal?  Interim History / Subjective:  UC back with ESBL klebsiella   Objective   Blood pressure (!) 162/78, pulse 87, temperature 98.3 F (36.8 C), temperature source Axillary, resp. rate (!) 22, height '5\' 2"'$  (1.575 m), weight 49.2 kg, SpO2 99 %.        Intake/Output Summary (Last 24 hours) at 06/07/2022 4742 Last data filed at 06/07/2022 5956 Gross per 24 hour  Intake 3101.49 ml  Output 600 ml  Net 2501.49 ml   Filed Weights   06/05/22 0706  Weight: 49.2 kg   Examination: General:  acute on chronically ill, cachectic  and older appearing female writhing in bed  HEENT: MM pink/dry, pupils 3/reactive Neuro:  agitated doing hip thrust in bed, MAE, does not f/c or open eyes, attempts to localize but mostly moans and withdrawals to noxious stimuli  CV: rr, ST PULM:  non labored, tachypneic at times, scattered rhonchi, at times audible rhonchi  GI: soft, bs+, colostomy, ostomy pouch over opening at suprapubic area with fluid leaking Extremities: warm/dry, no LE edema  Skin: scattered scabbed abrasions to LE, large sacral wound dressing in place   Resolved Hospital Problem list    Assessment & Plan:   Acute encephalopathy - likely multifactorial given UTI, ETOH withdrawal, high risk for Dts, and polysubstance abuse - tx to ICU.  High risk for intubation  - aspiration precautions, HOB up - change to phenobarbital taper with prn ativan  - sepsis could be contributing to her encephalopathy, see below - check ammonia  - seizure precautions  - continue empiric thiamine/ folate/ MVI  High risk for airway compromise secondary to above -monitor airway in ICU - CXR clear on admit -aspiration precautions -currently no O2 requirements, prn as needed for sat goal > 92 -NPO -HOB > 30 -CXR prn  ESBL Klebsiella UTI - stop ceftriaxone, change to meropenum x 7 days - follow blood cultures  - contact precautions  AKI, improving AGMA, improving  - stop bicarb - continue LR MIVF while NPO, caution w/hx of HF - strict I/Os - trend renal indices,  - Avoid nephrotoxins  Hypokalemia Hypomag - aggressive K and Mag replete -  recheck BMET at 1800  Chronic sacral decubitus ulcer, stage IV - per WOC  - no evidence of osteo on CT a/p  Rectal cancer s/p colostomy 2018 ? Suprapubic fistula from bladder - will need to reschedule her 8/4 f/u appt with surgery - CT a/p 8/4 did not show evidence of visible fistula from bladder to anterior abd wall  Protein calorie malnutrition  - NPO currently  Normocytic  anemia - stable, trend on CBC - transfuse < 7  Best Practice (right click and "Reselect all SmartList Selections" daily)   Diet/type: NPO DVT prophylaxis: prophylactic heparin  GI prophylaxis: PPI Lines: N/A Foley:  N/A Code Status:  full code Last date of multidisciplinary goals of care discussion [pending]  [name]- cousin [phone]  Labs   CBC: Recent Labs  Lab 06/01/22 0501 06/05/22 0707 06/05/22 0714 06/06/22 0537  WBC 4.0 5.7  --  6.2  NEUTROABS 2.5 4.1  --   --   HGB 12.4 11.1* 11.9* 10.0*  HCT 40.0 36.2 35.0* 32.3*  MCV 87.1 89.6  --  89.0  PLT 292 317  --  564    Basic Metabolic Panel: Recent Labs  Lab 06/05/22 0707 06/05/22 0714 06/05/22 1300 06/06/22 0537 06/06/22 1110 06/06/22 1624 06/07/22 0055  NA 138   < > 142 145 145 146* 142  K 4.5   < > 3.7 3.7 3.3* 3.3* 3.2*  CL 110  --  116* 121* 122* 118* 109  CO2 12*  --  13* 11* 11* 16* 23  GLUCOSE 67*  --  59* 50* 57* 81 117*  BUN 34*  --  27* 21* '18 17 10  '$ CREATININE 1.91*  --  1.58* 1.55* 1.46* 1.48* 1.32*  CALCIUM 8.9  --  7.9* 8.5* 8.7* 8.7* 8.4*  MG 2.3  --   --   --   --   --  1.5*  PHOS  --   --  5.0*  --   --   --   --    < > = values in this interval not displayed.   GFR: Estimated Creatinine Clearance: 37.8 mL/min (A) (by C-G formula based on SCr of 1.32 mg/dL (H)). Recent Labs  Lab 06/01/22 0501 06/05/22 0707 06/05/22 0740 06/06/22 0537  WBC 4.0 5.7  --  6.2  LATICACIDVEN  --   --  1.1  --     Liver Function Tests: Recent Labs  Lab 06/05/22 0707 06/06/22 0537  AST 23 18  ALT 8 11  ALKPHOS 105 78  BILITOT 0.8 0.9  PROT 8.0 6.3*  ALBUMIN 3.2* 2.4*   No results for input(s): "LIPASE", "AMYLASE" in the last 168 hours. No results for input(s): "AMMONIA" in the last 168 hours.  ABG    Component Value Date/Time   PHART 7.37 06/06/2022 1705   PCO2ART 34 06/06/2022 1705   PO2ART 31 (LL) 06/06/2022 1705   HCO3 19.7 (L) 06/06/2022 1705   TCO2 15 (L) 06/05/2022  0714   ACIDBASEDEF 4.8 (H) 06/06/2022 1705   O2SAT 57.2 06/06/2022 1705     Coagulation Profile: No results for input(s): "INR", "PROTIME" in the last 168 hours.  Cardiac Enzymes: No results for input(s): "CKTOTAL", "CKMB", "CKMBINDEX", "TROPONINI" in the last 168 hours.  HbA1C: Hgb A1c MFr Bld  Date/Time Value Ref Range Status  02/06/2022 06:34 AM 5.0 4.8 - 5.6 % Final    Comment:    (NOTE) Pre diabetes:          5.7%-6.4%  Diabetes:              >6.4%  Glycemic control for   <7.0% adults with diabetes   10/29/2021 07:27 AM 5.2 4.8 - 5.6 % Final    Comment:    (NOTE) Pre diabetes:          5.7%-6.4%  Diabetes:              >6.4%  Glycemic control for   <7.0% adults with diabetes     CBG: Recent Labs  Lab 06/05/22 0743 06/06/22 1204 06/06/22 1613 06/06/22 2325 06/07/22 0852  GLUCAP 72 74 81 113* 114*    Review of Systems:   unable  Past Medical History:  She,  has a past medical history of Acquired hypothyroidism, Alcohol abuse, Allergy, Arthritis, Bipolar 1 disorder ([place]), Cancer ([place]) (01/21/2017), Cancer ([place]), Chronic kidney disease, Chronic pain syndrome, Cirrhosis ([place]), Cirrhosis of liver ([place]), Cocaine abuse ([place]), COVID-19 virus infection (06/23/2021), Delirium tremens ([place]) (06/22/2021), Depression, Genetic testing (03/24/2017), HTN (hypertension), Hypertension, Polysubstance abuse ([place]), Rectal cancer ([place]), and Suicidal ideation (02/11/2014).   Surgical History:   Past Surgical History:  Procedure Laterality Date   ABDOMINAL PERINEAL BOWEL RESECTION N/A 06/18/2017   Procedure: ABDOMINAL PERINEAL RESECTION ERAS PATHWAY;  Surgeon: Leighton Ruff, MD;  Location: [facility];  Service: General;  Laterality: N/A;   BUBBLE STUDY  11/29/2021   Procedure: BUBBLE STUDY;  Surgeon: Berniece Salines, DO;  Location: [facility];  Service: Cardiovascular;;   COLON SURGERY     COLONOSCOPY WITH PROPOFOL Left 01/21/2017   Procedure: COLONOSCOPY WITH PROPOFOL;  Surgeon:  Otis Brace, MD;  Location: [facility];  Service: Gastroenterology;  Laterality: Left;   Colostomy     FRACTURE SURGERY     MANDIBLE FRACTURE SURGERY     TEE WITHOUT CARDIOVERSION N/A 11/29/2021   Procedure: TRANSESOPHAGEAL ECHOCARDIOGRAM (TEE);  Surgeon: Berniece Salines, DO;  Location: [facility];  Service: Cardiovascular;  Laterality: N/A;     Social History:   reports that she has been smoking cigarettes. She started smoking about 45 years ago. She has a 44.00 pack-year smoking history. She has never used smokeless tobacco. She reports current alcohol use. She reports current drug use. Drugs: Cocaine and "Crack" cocaine.   Family History:  Her family history includes Cancer (age of onset: 57) in her mother; Cancer - Other in her maternal aunt.   Allergies Allergies  Allergen Reactions   Penicillins Hives and Swelling    Tolerated Unasyn, Rocephin Did it involve swelling of the face/tongue/throat, SOB, or low BP? Yes Did it involve sudden or severe rash/hives, skin peeling, or any reaction on the inside of your mouth or nose? No Did you need to seek medical attention at a hospital or doctor's office? No When did it last happen?  <10 years   If all above answers are "NO", may proceed with cephalosporin use.      Home Medications  Prior to Admission medications   Medication Sig Start Date End Date Taking? Authorizing Provider  acetaminophen (TYLENOL) 325 MG tablet Take 2 tablets (650 mg total) by mouth every 12 (twelve) hours. 12/13/21   Samella Parr, NP  albuterol (VENTOLIN HFA) 108 (90 Base) MCG/ACT inhaler Inhale 2 puffs into the lungs every 6 (six) hours as needed for wheezing or shortness of breath. 04/17/22   Shelly Coss, MD  Benzocaine (HURRCAINE) 20 % AERO Use as directed 1 Application in the mouth or throat 3 (three) times daily as needed for mouth pain.  04/17/22   Shelly Coss, MD  busPIRone (BUSPAR) 15 MG tablet Take 1 tablet (15 mg total) by mouth 2 (two)  times daily. 04/17/22   Shelly Coss, MD  carvedilol (COREG) 6.25 MG tablet Take 1 tablet (6.25 mg total) by mouth 2 (two) times daily with a meal. 04/17/22   Shelly Coss, MD  feeding supplement (ENSURE ENLIVE / ENSURE PLUS) LIQD Take 237 mLs by mouth 3 (three) times daily between meals. Patient not taking: Reported on 05/28/2022 12/13/21   Samella Parr, NP  folic acid (FOLVITE) 1 MG tablet Take 1 tablet (1 mg total) by mouth daily. 04/17/22   Shelly Coss, MD  magic mouthwash (nystatin, lidocaine, diphenhydrAMINE, alum & mag hydroxide) suspension Swish and spit 5 mLs 4 (four) times daily as needed for mouth pain. 04/22/22   White, Leitha Schuller, NP  potassium chloride (KLOR-CON) 10 MEQ tablet Take 1 tablet (10 mEq total) by mouth daily for 20 doses. 04/24/22 08/09/22  Wyvonnia Dusky, MD  thiamine 100 MG tablet Take 1 tablet (100 mg total) by mouth daily. Patient not taking: Reported on 05/28/2022 04/17/22   Shelly Coss, MD  umeclidinium-vilanterol [facility] ELLIPTA) 62.5-25 MCG/ACT AEPB Inhale 1 puff into the lungs daily. 04/17/22   Shelly Coss, MD  ziprasidone (GEODON) 40 MG capsule Take 1 capsule (40 mg total) by mouth 2 (two) times daily with a meal. 11/06/11 01/09/12  Daleen Bo, MD     Critical care time: 50 mins       Kennieth Rad, MSN, AG-ACNP-BC [facility] 06/07/2022, 12:15 PM  See Amion for pager If no response to pager, please call PCCM consult pager After 7:00 pm call El[place]

## 2022-06-07 NOTE — Procedures (Signed)
Intubation Procedure Note  MISHEEL GOWANS  [id number]  05-Sep-1966  Date:06/07/22  Time:1:06 PM   Provider Performing:Acire Tang    Procedure: Intubation (31500)  Indication(s) Respiratory Failure  Consent Risks of the procedure as well as the alternatives and risks of each were explained to the patient and/or caregiver.  Consent for the procedure was obtained and is signed in the bedside chart   Anesthesia Etomidate and Rocuronium   Time Out Verified patient identification, verified procedure, site/side was marked, verified correct patient position, special equipment/implants available, medications/allergies/relevant history reviewed, required imaging and test results available.   Sterile Technique Usual hand hygeine, masks, and gloves were used   Procedure Description Patient positioned in bed supine.  Sedation given as noted above.  Patient was intubated with endotracheal tube using  MAC4 .  View was Grade 2 only posterior commissure .  Number of attempts was 1.  Colorimetric CO2 detector was consistent with tracheal placement.   Complications/Tolerance None; patient tolerated the procedure well. Chest X-ray is ordered to verify placement.   EBL Minimal   Specimen(s) None

## 2022-06-07 NOTE — Progress Notes (Addendum)
Pharmacy Antibiotic Note  Sherry Baker is a 56 y.o. female admitted on 06/05/2022 with  ESBL UTI and aspiration pneumonia .  Pharmacy has been consulted for Vancomycin and Meropenem dosing.  Plan: Ceftriaxone discontinued Start meropenem 1g IV q12h x 7 days Start vancomycin '1250mg'$  IV q48h (eAUC~493)    > Goal AUC 400-550    > Check vancomycin levels at steady state Monitor daily WBC, temp, SCr, and clinical s/sx of infection.  Height: '5\' 2"'$  (157.5 cm) Weight: 49.2 kg (108 lb 7.5 oz) IBW/kg (Calculated) : 50.1  Temp (24hrs), Avg:98.6 F (37 C), Min:98.3 F (36.8 C), Max:98.9 F (37.2 C)  Recent Labs  Lab 06/01/22 0501 06/05/22 0707 06/05/22 0740 06/05/22 1300 06/06/22 0537 06/06/22 1110 06/06/22 1624 06/07/22 0055  WBC 4.0 5.7  --   --  6.2  --   --   --   CREATININE 1.28* 1.91*  --  1.58* 1.55* 1.46* 1.48* 1.32*  LATICACIDVEN  --   --  1.1  --   --   --   --   --     Estimated Creatinine Clearance: 37.8 mL/min (A) (by C-G formula based on SCr of 1.32 mg/dL (H)).    Allergies  Allergen Reactions   Penicillins Hives and Swelling    Tolerated Unasyn, Rocephin Did it involve swelling of the face/tongue/throat, SOB, or low BP? Yes Did it involve sudden or severe rash/hives, skin peeling, or any reaction on the inside of your mouth or nose? No Did you need to seek medical attention at a hospital or doctor's office? No When did it last happen?  <10 years   If all above answers are "NO", may proceed with cephalosporin use.     Antimicrobials this admission: Ceftriaxone 8/3 >> 8/5 Meropenem 8/5 >>  Vancomycin 8/5 >>   Dose adjustments this admission: N/A  Microbiology results: 8/3 BCx: NGTD x2d 8/3 UCx: >100K ESBL Kleb pneumoniae (S: gent, imipenem, Zosyn (MIC 16) 8/4 MRSA PCR: not detected  Thank you for allowing pharmacy to be a part of this patient's care.  [name] 06/07/2022 11:16 AM  __________________________________________ Note addended above to  include vancomycin dosing recommendations.   Luisa Hart, PharmD, BCPS Clinical Pharmacist 06/07/2022 1:47 PM

## 2022-06-07 NOTE — Progress Notes (Signed)
HD#2 Subjective:   Summary: Patient was much more agitated today and writhing in bed.  She continued to have issues with secretions in her airway.  She was still nonresponsive to verbal commands or questions.  Nursing staff voiced her concerns about the increased agitation and her worsening secretions and tongue that could compromise her airway as her agitation worsens and/or she receives more as needed Ativan.  Overnight Events: CIWA scores between 11 and 12 throughout the night and required 6 mg of Ativan after midnight.     Objective:  Vital signs in last 24 hours: Vitals:   06/06/22 2125 06/06/22 2336 06/07/22 0343 06/07/22 0839  BP: (!) 156/99 (!) 141/110 (!) 157/83 (!) 162/78  Pulse: 93 86 96 87  Resp: '18 16 15 '$ (!) 22  Temp: 98.6 F (37 C) 98.9 F (37.2 C) 98.6 F (37 C) 98.3 F (36.8 C)  TempSrc: Axillary Axillary Axillary Axillary  SpO2: 99% 97% 99% 99%  Weight:      Height:       Supplemental O2: Room Air SpO2: 99 %   Physical Exam:  Constitutional: Middle-aged female appears older than stated age and overall malnourished.  She was writhing and rocking in bed and appears to be pain HENT: normocephalic atraumatic Abdominal: soft, non-distended.  Ostomy in left lower quadrant without any apparent leaks, bleeding, or surrounding edema/erythema.  Small fistula like hole in the suprapubic region draining what appears to be urine. Neurological: Agitated  Filed Weights   06/05/22 0706  Weight: 49.2 kg     Intake/Output Summary (Last 24 hours) at 06/07/2022 0953 Last data filed at 06/07/2022 0438 Gross per 24 hour  Intake 3101.49 ml  Output 600 ml  Net 2501.49 ml   Net IO Since Admission: 3,101.49 mL [06/07/22 0953]  Pertinent Labs:    Latest Ref Rng & Units 06/06/2022    5:37 AM 06/05/2022    7:14 AM 06/05/2022    7:07 AM  CBC  WBC 4.0 - 10.5 K/uL 6.2   5.7   Hemoglobin 12.0 - 15.0 g/dL 10.0  11.9  11.1   Hematocrit 36.0 - 46.0 % 32.3  35.0  36.2    Platelets 150 - 400 K/uL 276   317        Latest Ref Rng & Units 06/07/2022   12:55 AM 06/06/2022    4:24 PM 06/06/2022   11:10 AM  CMP  Glucose 70 - 99 mg/dL 117  81  57   BUN 6 - 20 mg/dL '10  17  18   '$ Creatinine 0.44 - 1.00 mg/dL 1.32  1.48  1.46   Sodium 135 - 145 mmol/L 142  146  145   Potassium 3.5 - 5.1 mmol/L 3.2  3.3  3.3   Chloride 98 - 111 mmol/L 109  118  122   CO2 22 - 32 mmol/L '23  16  11   '$ Calcium 8.9 - 10.3 mg/dL 8.4  8.7  8.7     Imaging: CT ABDOMEN PELVIS W CONTRAST  Result Date: 06/06/2022 CLINICAL DATA:  Abdominal pain, acute, nonlocalized There is an unknown fistula in the suprapubic region anteriorly that is draining clear liquid appears to be urine. No recorded history of indwelling suprapubic catheters known EXAM: CT ABDOMEN AND PELVIS WITH CONTRAST TECHNIQUE: Multidetector CT imaging of the abdomen and pelvis was performed using the standard protocol following bolus administration of intravenous contrast. RADIATION DOSE REDUCTION: This exam was performed according to the departmental dose-optimization program which  includes automated exposure control, adjustment of the mA and/or kV according to patient size and/or use of iterative reconstruction technique. CONTRAST:  34m OMNIPAQUE IOHEXOL 300 MG/ML  SOLN COMPARISON:  06/01/2022 FINDINGS: Lower chest: Not visualized Hepatobiliary: Liver is not imaged in its entirety. No visible focal hepatic abnormality in the areas imaged. Gallbladder unremarkable. Pancreas: No focal abnormality or ductal dilatation. Spleen: Spleen not imaged in its entirety. No focal abnormality in the portions imaged. Adrenals/Urinary Tract: Right kidney is atrophic. No stones or hydronephrosis. No renal or adrenal mass. Mild wall thickening within the urinary bladder. No definite visible fistula seen extending from the bladder to the anterior abdominal wall. Stomach/Bowel: Left lower quadrant ostomy noted. Parastomal hernia contains predominantly fat.  Findings are stable since prior study. No bowel obstruction. Prior distal colectomy. Vascular/Lymphatic: Aortic atherosclerosis. No evidence of aneurysm or adenopathy. Reproductive: No visible focal abnormality. Other: Presacral soft tissue thickening again noted, asymmetric to the left, unchanged since recent study. No free fluid or free air. Musculoskeletal: Large sacral decubitus ulcer noted. This is unchanged since prior study. No evidence for osteomyelitis. Advanced degenerative changes within the hips bilaterally. IMPRESSION: Postoperative changes from distal colectomy. Left lower quadrant ostomy again noted, unchanged. Stable parastomal hernia containing fat. Aortic atherosclerosis. Mild gallbladder wall thickening. This could reflect cystitis. Recommend clinical correlation. Stable presacral soft tissue thickening favored represent postoperative changes. Recommend attention on follow-up imaging or PET CT. Large sacral decubitus ulcer unchanged. No evidence of osteomyelitis. Electronically Signed   By: KRolm BaptiseM.D.   On: 06/06/2022 20:23    Assessment/Plan:   Principal Problem:   Acute encephalopathy Active Problems:   Rectal cancer ([place]   Polysubstance abuse with associated depression and anxiety   Al[place] use disorder, severe, dependence ([place]   [place] kidney injury) ([place]   Chronic Sacral wound, subsequent encounter   UTI (urinary tract infection)   High anion gap metabolic acidosis   Patient Summary: Sherry LOUNSBURYis a 56y.o. with a pertinent PMH of colon cancer status post colostomy in 2018, polysubstance abuse, and sacral decubitus ulcer who presented with erratic behavior and admitted for acute encephalopathy.    Acute encephalopathy Polysubstance use disorder Alcohol use disorder with history of DT Patient has been seen in the hospital before for similar erratic behavior.  The most likely cause is due to substance overdose (cocaine and benzo) and/or withdrawals.  She  is also at risk for alcohol withdrawal with past DT.  Possible infectious cause with multiple possible routes of infection including urinary, wound, IV drug use. She remains hemodynamically stable although she is becoming more agitated and requiring more as needed Ativan.  She is also having more issues with her oral secretions that are getting more and more difficult for the nurses to suction they expressed their concern about her airway.  We contacted PCCM who evaluated the patient and admitted him to their service. -Transfer to ICU   Anion gap metabolic acidosis with concomitant non-anion anion gap metabolic acidosis ABG pH of 7.27 with a bicarb of 12.4.  Positive urine anion gap, negative osmolar gap.  Lactic acid of 1.1, negative ethanol, negative salicylates.  Renal ultrasound showed left hydronephrosis and did not visualize the right kidney.  Anion gap originally 16 now down to 12.  This is likely multifactorial and could indicate ingestion or possibly early renal failure. Repeat ABG and BMP this afternoon showed improvement of acidosis with a pH of 7.37 and a bicarb of 19.7.  Ingestion labs  were negative for ethylene glycol, methanol, acetone, isopropyl alcohol, ethanol.   AKI - Creatine 1.9 with baseline around 1.2, GFR 31.  Creatinine has steadily fallen to 1.32.   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  Urine appears to be draining from a fistula in the suprapubic region.  There is no history of suprapubic catheters that we can find.  Found documentation from Dr. Valeta Harms who had tried to send the patient to be evaluated by surgery for this in the past.  CT of the abdomen and pelvis did not show this fistula connecting with any intra-abdominal organs. -Pending urine culture -Received 3 days of Rocephin. Her past urine culture in 6/20 grew Proteus.   Hypokalemia Hypomagnesemia Initial potassium 4.5 is dropped to 3.2.  Magnesium 1.5  -Replete  potassium and magnesium    Hypernatremia Initial sodium of 138 has risen to 146 over 34 hours.  Stable at 145 today after switching sodium bicarb fluids with LR    Rectal cancer status post APR with colostomy 2018 Patient had a schedule appointment with surgery on 8/4.  We will need to to reschedule that. -Wound care consult for ostomy   Chronic sacral decubitus ulcer Stage IV decubitus ulcer.  Does not look grossly infected but she at risk for infection. -Wound care consult -Antibiotics as above -Blood culture negative at 48 hours   Diet: NPO IVF:  LR ,125cc/hr VTE: Enoxaparin Code: Full PT/OT recs: None, none. TOC recs: Pending       Dispo: Patient transferred to ICU and we will continue to follow she is hopefully stabilized and able to be transferred back to our service when she no longer requires ICU level care.  For updates to her care as she transfers to ICU please see PCCM note.  [facility] Resident PGY-1 Pager: [phone]  Please contact the on call pager after 5 pm and on weekends at [phone].

## 2022-06-07 NOTE — Progress Notes (Signed)
Initial Nutrition Assessment  DOCUMENTATION CODES:   Not applicable  INTERVENTION:   Initiate Vital AF 1.2 @ 20 ml/hr via OGT and increase by 10 ml every 8 hours to goal rate of 50 ml/hr.     Tube feeding regimen provides 1440 kcals, 90 grams of protein, and 983 ml of H2O.    -1 packet Juven BID, each packet provides 95 calories, 2.5 grams of protein (collagen), and 9.8 grams of carbohydrate (3 grams sugar); also contains 7 grams of L-arginine and L-glutamine, 300 mg vitamin C, 15 mg vitamin E, 1.2 mcg vitamin B-12, 9.5 mg zinc, 200 mg calcium, and 1.5 g  Calcium Beta-hydroxy-Beta-methylbutyrate to support wound healing   -Monitor Mg, K, and Phos daily and replete as needed, as pt is at high risk for refeeding syndrome  NUTRITION DIAGNOSIS:   Inadequate oral intake related to inability to eat as evidenced by NPO status.  GOAL:   Patient will meet greater than or equal to 90% of their needs  MONITOR:   Vent status, TF tolerance  REASON FOR ASSESSMENT:   Consult Assessment of nutrition requirement/status, Enteral/tube feeding initiation and management  ASSESSMENT:   Pt with polysubstance abuse and daily alcohol abuse was admitted with altered mental status, admitted with acute toxic encephalopathy and alcohol withdrawal.  PCCM was consulted today patient was found to be severely encephalopathic, not able to protect her airway  Patient is currently intubated on ventilator support. OGT placement verified by x-ray; tip of tube over distal stomach. MV: 10.8 L/min Temp (24hrs), Avg:98.5 F (36.9 C), Min:97.9 F (36.6 C), Max:98.9 F (37.2 C)  MAP: 106  Reviewed I/O's: +2.5 L x 24 hours and +3.1 L since admission  UOP: 600 ml x 24 hours  Pt intubated today due for airway protection.  Case discussed with PCCM; clarified order to start to TF today. Per PCCM, pt is at high refeeding risk.   Reviewed wt hx; no wt loss noted over the past 6 months.   Medications reviewed  and include colace, miralax, vitamin B-1, precedex, lactated ringers @ 125 ml/hr.   Labs reviewed: K: 2.8, CBGS: 100-114 (inpatient orders for glycemic control are none).    Diet Order:   Diet Order             Diet NPO time specified  Diet effective now                   EDUCATION NEEDS:   No education needs have been identified at this time  Skin:  Skin Assessment: Skin Integrity Issues: Skin Integrity Issues:: Stage IV, Other (Comment) Stage IV: sacrum Other: non-pressure, full thickness wound to anus; open area to rt lower medial abdomen  Last BM:  Unknown  Height:   Ht Readings from Last 1 Encounters:  06/05/22 '5\' 2"'$  (1.575 m)    Weight:   Wt Readings from Last 1 Encounters:  06/05/22 49.2 kg   BMI:  Body mass index is 19.84 kg/m.  Estimated Nutritional Needs:   Kcal:  1242  Protein:  85-100 grams  Fluid:  > 1.2 L    Loistine Chance, RD, LDN, [place] Registered Dietitian II Certified Diabetes Care and Education Specialist Please refer to [facility] for RD and/or RD on-call/weekend/after hours pager

## 2022-06-07 NOTE — Progress Notes (Signed)
VO from Kennieth Rad NP for '2mg'$  ativan IVP.

## 2022-06-07 NOTE — IPAL (Signed)
  Interdisciplinary Goals of Care Family Meeting   Date carried out: 06/07/2022  Location of the meeting:  telephone  Member's involved: Nurse Practitioner, Bedside Registered Nurse, and Family Member or next of kin, Chase, C., RN to confirm code status.   Durable Power of Attorney or acting medical decision maker: Training and development officer, Cousin is NOK    Discussion: We discussed goals of care for De Nurse updated on hospital events.  We discussed goals of care.  States patient is living in a crack house, sleeps on the floor, and has refused to get help/ rehab on multiple occasions. She is her only family as her parents and sister have passed away and believes a lot of her substance issues are related to that.  She confirms that patient has expressed many times that she would not want long term support or to go a SNF.  OK with short term aggressive medical care including intubation and vasopressor support but no CPR or long term care including trach.  Code status: Limited Code or DNR with short term  Disposition: Continue current acute care  Time spent for the meeting: 61 mins   Kennieth Rad, MSN, AG-ACNP-BC [facility] 06/07/2022, 12:12 PM  See Amion for pager If no response to pager, please call PCCM consult pager After 7:00 pm call Elink

## 2022-06-08 DIAGNOSIS — G9340 Encephalopathy, unspecified: Secondary | ICD-10-CM | POA: Diagnosis not present

## 2022-06-08 DIAGNOSIS — F14129 Cocaine abuse with intoxication, unspecified: Secondary | ICD-10-CM | POA: Diagnosis not present

## 2022-06-08 DIAGNOSIS — N179 Acute kidney failure, unspecified: Secondary | ICD-10-CM | POA: Diagnosis not present

## 2022-06-08 LAB — RENAL FUNCTION PANEL
Albumin: 2 g/dL — ABNORMAL LOW (ref 3.5–5.0)
Anion gap: 9 (ref 5–15)
BUN: 5 mg/dL — ABNORMAL LOW (ref 6–20)
CO2: 25 mmol/L (ref 22–32)
Calcium: 7.8 mg/dL — ABNORMAL LOW (ref 8.9–10.3)
Chloride: 105 mmol/L (ref 98–111)
Creatinine, Ser: 1.11 mg/dL — ABNORMAL HIGH (ref 0.44–1.00)
GFR, Estimated: 59 mL/min — ABNORMAL LOW (ref >60)
Glucose, Bld: 92 mg/dL (ref 70–99)
Phosphorus: 5.5 mg/dL — ABNORMAL HIGH (ref 2.5–4.6)
Potassium: 4.4 mmol/L (ref 3.5–5.1)
Sodium: 139 mmol/L (ref 135–145)

## 2022-06-08 LAB — PHOSPHORUS: Phosphorus: 3.2 mg/dL (ref 2.5–4.6)

## 2022-06-08 LAB — CBC
HCT: 30.5 % — ABNORMAL LOW (ref 36.0–46.0)
Hemoglobin: 9.8 g/dL — ABNORMAL LOW (ref 12.0–15.0)
MCH: 27.9 pg (ref 26.0–34.0)
MCHC: 32.1 g/dL (ref 30.0–36.0)
MCV: 86.9 fL (ref 80.0–100.0)
Platelets: 223 10*3/uL (ref 150–400)
RBC: 3.51 MIL/uL — ABNORMAL LOW (ref 3.87–5.11)
RDW: 16.2 % — ABNORMAL HIGH (ref 11.5–15.5)
WBC: 3.9 10*3/uL — ABNORMAL LOW (ref 4.0–10.5)
nRBC: 0 % (ref 0.0–0.2)

## 2022-06-08 LAB — MAGNESIUM
Magnesium: 2 mg/dL (ref 1.7–2.4)
Magnesium: 1.7 mg/dL (ref 1.7–2.4)

## 2022-06-08 LAB — GLUCOSE, CAPILLARY
Glucose-Capillary: 94 mg/dL (ref 70–99)
Glucose-Capillary: 94 mg/dL (ref 70–99)
Glucose-Capillary: 85 mg/dL (ref 70–99)
Glucose-Capillary: 98 mg/dL (ref 70–99)
Glucose-Capillary: 111 mg/dL — ABNORMAL HIGH (ref 70–99)
Glucose-Capillary: 125 mg/dL — ABNORMAL HIGH (ref 70–99)

## 2022-06-08 MED ORDER — PHENOBARBITAL 32.4 MG PO TABS
64.8000 mg | ORAL_TABLET | Freq: Three times a day (TID) | ORAL | Status: AC
  Administered 2022-06-09 – 2022-06-11 (×6): 64.8 mg
  Filled 2022-06-08 (×6): qty 2

## 2022-06-08 MED ORDER — VANCOMYCIN HCL 750 MG/150ML IV SOLN
750.0000 mg | INTRAVENOUS | Status: DC
Start: 2022-06-08 — End: 2022-06-09
  Administered 2022-06-08: 750 mg via INTRAVENOUS
  Filled 2022-06-08 (×3): qty 150

## 2022-06-08 MED ORDER — PHENOBARBITAL 32.4 MG PO TABS
32.4000 mg | ORAL_TABLET | Freq: Three times a day (TID) | ORAL | Status: AC
  Administered 2022-06-11 – 2022-06-13 (×6): 32.4 mg
  Filled 2022-06-08 (×6): qty 1

## 2022-06-08 MED ORDER — PHENOBARBITAL 97.2 MG PO TABS
97.2000 mg | ORAL_TABLET | Freq: Three times a day (TID) | ORAL | Status: AC
  Administered 2022-06-08 – 2022-06-09 (×4): 97.2 mg
  Filled 2022-06-08 (×4): qty 1

## 2022-06-08 MED ORDER — ADULT MULTIVITAMIN W/MINERALS CH
1.0000 | ORAL_TABLET | Freq: Every day | ORAL | Status: DC
  Administered 2022-06-09 – 2022-06-14 (×6): 1
  Filled 2022-06-08 (×6): qty 1

## 2022-06-08 MED ORDER — QUETIAPINE FUMARATE 50 MG PO TABS
50.0000 mg | ORAL_TABLET | Freq: Two times a day (BID) | ORAL | Status: DC
Start: 2022-06-08 — End: 2022-06-14
  Administered 2022-06-08 – 2022-06-14 (×13): 50 mg
  Filled 2022-06-08 (×2): qty 1
  Filled 2022-06-08 (×4): qty 2
  Filled 2022-06-08 (×2): qty 1
  Filled 2022-06-08: qty 2
  Filled 2022-06-08: qty 1
  Filled 2022-06-08 (×2): qty 2
  Filled 2022-06-08: qty 1

## 2022-06-08 MED ORDER — SODIUM CHLORIDE 0.9 % IV SOLN: INTRAVENOUS | Status: DC | PRN

## 2022-06-08 NOTE — Progress Notes (Signed)
Pharmacy Antibiotic Note  Sherry Baker is a 56 y.o. female admitted on 06/05/2022 with  ESBL UTI and aspiration pneumonia .  Pharmacy has been consulted for Vancomycin and Meropenem dosing.  WBC 3.9, Afebrile SCr trending down to 1.11  Plan: Continue meropenem 1g IV q12h x 7 days Change vancomycin to 750 IV q24h (eAUC~507)    > Goal AUC 400-550    > Check vancomycin levels at steady state Monitor daily WBC, temp, SCr, and clinical s/sx of infection.  Height: '5\' 2"'$  (157.5 cm) Weight: 49.2 kg (108 lb 7.5 oz) IBW/kg (Calculated) : 50.1  Temp (24hrs), Avg:97.2 F (36.2 C), Min:94 F (34.4 C), Max:98.6 F (37 C)  Recent Labs  Lab 06/05/22 0707 06/05/22 0740 06/05/22 1300 06/06/22 0537 06/06/22 1110 06/06/22 1624 06/07/22 0055 06/07/22 1542 06/08/22 0216  WBC 5.7  --   --  6.2  --   --   --   --  3.9*  CREATININE 1.91*  --    < > 1.55* 1.46* 1.48* 1.32* 1.05* 1.11*  LATICACIDVEN  --  1.1  --   --   --   --   --   --   --    < > = values in this interval not displayed.     Estimated Creatinine Clearance: 45 mL/min (A) (by C-G formula based on SCr of 1.11 mg/dL (H)).    Allergies  Allergen Reactions   Penicillins Hives and Swelling    Tolerated Unasyn, Rocephin Did it involve swelling of the face/tongue/throat, SOB, or low BP? Yes Did it involve sudden or severe rash/hives, skin peeling, or any reaction on the inside of your mouth or nose? No Did you need to seek medical attention at a hospital or doctor's office? No When did it last happen?  <10 years   If all above answers are "NO", may proceed with cephalosporin use.     Antimicrobials this admission: Ceftriaxone 8/3 >> 8/5 Meropenem 8/5 >>  Vancomycin 8/5 >>   Dose adjustments this admission: N/A  Microbiology results: 8/3 BCx: NGTD x3d 8/3 UCx: >100K ESBL Kleb pneumoniae (S: gent, imipenem, Zosyn (MIC 16) 8/4 MRSA PCR: not detected 8/5 Trach aspirate: no organisms seen  Thank you for allowing pharmacy  to be a part of this patient's care.  [name] 06/08/2022 9:52 AM

## 2022-06-08 NOTE — Consult Note (Signed)
NAME:  Sherry Baker, MRN:  [id number], DOB:  [birth year], LOS: 3 ADMISSION DATE:  06/05/2022, CONSULTATION DATE:  06/07/22 REFERRING MD:  Dr. Nikki Dom, CHIEF COMPLAINT:  ETOH withdrawal   History of Present Illness:  56 year old female with PMH as below significant for polysubstance and daily ETOH abuse with hx of DTs.  Presented 8/3 with erratic behavior, rolling around in the grass with urinary incontinence.    Recent admit 5/3 for acute encephalopathy from enterococcus bacteremia and then on 6/3 for acute encephalopathy from opioid overdose.    Positive for cocaine and benzodiazepine on UDS, ETOH neg, and CTH negative for acute abnormality.  Noted UTI on admit, started on ceftriaxone.  Admitted to IMTS with acute encephalopathy.  She has been persistently encephalopathic and on CIWA protocol, with CIWA 10-12 overnight, getting ativan and valium.  Some concern for airway protection given her oral secretions, requiring NTS overnight.  She remains on room air currently.  PCCM consulted for further assessment.   Pertinent  Medical History  Polysubstance abuse, ETOH abuse with hx of Dts, HFrEF, HTN, IDA, CKD 3b, colon cancer s/p colostomy, bipolar, cirrhosis, chronic sacral decubitus ulcer MRSA PNA  Significant Hospital Events: Including procedures, antibiotic start and stop dates in addition to other pertinent events   8/3 admitted IMTS encephalopathy, UTI, ETOH withdrawal?  Interim History / Subjective:  Still agitated and not following commands with sedation weaning.   Objective   Blood pressure 119/74, pulse 60, temperature 98.6 F (37 C), temperature source Oral, resp. rate 17, height '5\' 2"'$  (1.575 m), weight 49.2 kg, SpO2 100 %.    Vent Mode: PRVC FiO2 (%):  [30 %-40 %] 30 % Set Rate:  [18 bmp-24 bmp] 18 bmp Vt Set:  [400 mL] 400 mL PEEP:  [5 cmH20] 5 cmH20 Plateau Pressure:  [13 cmH20-14 cmH20] 14 cmH20   Intake/Output Summary (Last 24 hours) at 06/08/2022 0843 Last data filed at  06/08/2022 0700 Gross per 24 hour  Intake 5970.3 ml  Output 1400 ml  Net 4570.3 ml    Filed Weights   06/05/22 0706  Weight: 49.2 kg   Examination: General:  acute on chronically ill, cachectic and older appearing female writhing in bed  HEENT: MM pink/dry, pupils 3/reactive Neuro:  agitated doing hip thrust in bed, MAE, does not f/c or open eyes, attempts to localize but mostly moans and withdrawals to noxious stimuli  CV: rr, ST PULM:  non labored, chest clear. Apnea on PSV GI: soft, bs+, colostomy, ostomy pouch over opening at suprapubic area with fluid leaking GU: Foley catheter in place.  Extremities: warm/dry, no LE edema  Skin: scattered scabbed abrasions to LE, large sacral wound dressing in place   Ancillary tests personally reviewed:   Growing K.pneumoniae ESBL in urine Creatinine improving.,   Assessment & Plan:   Critically ill due to acute multifactorial encephalopathy requiring intubation for airway protection  likely multifactorial given UTI, ETOH withdrawal, high risk for Dts, and polysubstance abuse - Continue to titrate precedex, RASS 0/-1 -Has been loaded with phenobarbitol - Will add Seroquel  Critically ill due to acute hypoxic respiratory failure requiring mechanical ventilation.  - Daily SBT  - Mental status will dictate extubation timing  ESBL Klebsiella UTI - stop ceftriaxone, change to meropenem x 7 days - follow blood cultures  - contact precautions  AGMA due to AKI - Maintain current volume status and continue to follow.   Chronic sacral decubitus ulcer, stage IV - per WOC  -  no evidence of osteo on CT a/p  Rectal cancer s/p colostomy 2018 ? 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   Normocytic anemia - stable, trend on CBC - transfuse < 7  Best  Practice (right click and "Reselect all SmartList Selections" daily)   Diet/type: tubefeeds DVT prophylaxis: prophylactic heparin  GI prophylaxis: PPI Lines: N/A Foley:  N/A Code Status:  full code Last date of multidisciplinary goals of care discussion [pending]  [name]- cousin [phone]  CRITICAL CARE Performed by: Kipp Brood   Total critical care time: 40 minutes  Critical care time was exclusive of separately billable procedures and treating other patients.  Critical care was necessary to treat or prevent imminent or life-threatening deterioration.  Critical care was time spent personally by me on the following activities: development of treatment plan with patient and/or surrogate as well as nursing, discussions with consultants, evaluation of patient's response to treatment, examination of patient, obtaining history from patient or surrogate, ordering and performing treatments and interventions, ordering and review of laboratory studies, ordering and review of radiographic studies, pulse oximetry, re-evaluation of patient's condition and participation in multidisciplinary rounds.  Kipp Brood, MD [facility] ICU Physician [place]  Pager: [phone] Mobile: [phone] After hours: [phone].

## 2022-06-09 ENCOUNTER — Inpatient Hospital Stay (HOSPITAL_COMMUNITY): Payer: Medicaid Other

## 2022-06-09 DIAGNOSIS — G9340 Encephalopathy, unspecified: Secondary | ICD-10-CM | POA: Diagnosis not present

## 2022-06-09 LAB — GLUCOSE, CAPILLARY
Glucose-Capillary: 120 mg/dL — ABNORMAL HIGH (ref 70–99)
Glucose-Capillary: 59 mg/dL — ABNORMAL LOW (ref 70–99)
Glucose-Capillary: 136 mg/dL — ABNORMAL HIGH (ref 70–99)
Glucose-Capillary: 53 mg/dL — ABNORMAL LOW (ref 70–99)
Glucose-Capillary: 83 mg/dL (ref 70–99)
Glucose-Capillary: 73 mg/dL (ref 70–99)
Glucose-Capillary: 68 mg/dL — ABNORMAL LOW (ref 70–99)
Glucose-Capillary: 124 mg/dL — ABNORMAL HIGH (ref 70–99)
Glucose-Capillary: 73 mg/dL (ref 70–99)

## 2022-06-09 LAB — CBC
HCT: 33.2 % — ABNORMAL LOW (ref 36.0–46.0)
Hemoglobin: 10.2 g/dL — ABNORMAL LOW (ref 12.0–15.0)
MCH: 27.3 pg (ref 26.0–34.0)
MCHC: 30.7 g/dL (ref 30.0–36.0)
MCV: 88.8 fL (ref 80.0–100.0)
Platelets: 212 10*3/uL (ref 150–400)
RBC: 3.74 MIL/uL — ABNORMAL LOW (ref 3.87–5.11)
RDW: 16.8 % — ABNORMAL HIGH (ref 11.5–15.5)
WBC: 4.5 10*3/uL (ref 4.0–10.5)
nRBC: 0 % (ref 0.0–0.2)

## 2022-06-09 LAB — URINALYSIS, ROUTINE W REFLEX MICROSCOPIC
Bilirubin Urine: NEGATIVE
Glucose, UA: NEGATIVE mg/dL
Ketones, ur: NEGATIVE mg/dL
Leukocytes,Ua: NEGATIVE
Nitrite: NEGATIVE
Protein, ur: 30 mg/dL — AB
Specific Gravity, Urine: 1.011 (ref 1.005–1.030)
pH: 9 — ABNORMAL HIGH (ref 5.0–8.0)

## 2022-06-09 LAB — RENAL FUNCTION PANEL
Albumin: 1.7 g/dL — ABNORMAL LOW (ref 3.5–5.0)
Anion gap: 5 (ref 5–15)
BUN: 19 mg/dL (ref 6–20)
CO2: 25 mmol/L (ref 22–32)
Calcium: 8.1 mg/dL — ABNORMAL LOW (ref 8.9–10.3)
Chloride: 107 mmol/L (ref 98–111)
Creatinine, Ser: 1.16 mg/dL — ABNORMAL HIGH (ref 0.44–1.00)
GFR, Estimated: 56 mL/min — ABNORMAL LOW (ref >60)
Glucose, Bld: 91 mg/dL (ref 70–99)
Phosphorus: 3.6 mg/dL (ref 2.5–4.6)
Potassium: 4.4 mmol/L (ref 3.5–5.1)
Sodium: 137 mmol/L (ref 135–145)

## 2022-06-09 LAB — CULTURE, RESPIRATORY W GRAM STAIN

## 2022-06-09 LAB — PHOSPHORUS: Phosphorus: 3.6 mg/dL (ref 2.5–4.6)

## 2022-06-09 LAB — MAGNESIUM
Magnesium: 1.7 mg/dL (ref 1.7–2.4)
Magnesium: 2.2 mg/dL (ref 1.7–2.4)

## 2022-06-09 LAB — CREATININE, URINE, RANDOM: Creatinine, Urine: 25 mg/dL

## 2022-06-09 MED ORDER — DEXTROSE 50 % IV SOLN
INTRAVENOUS | Status: AC
  Filled 2022-06-09: qty 50

## 2022-06-09 MED ORDER — DEXTROSE 50 % IV SOLN
12.5000 g | INTRAVENOUS | Status: AC
  Administered 2022-06-09: 12.5 g via INTRAVENOUS
  Filled 2022-06-09: qty 50

## 2022-06-09 MED ORDER — DEXTROSE IN LACTATED RINGERS 5 % IV SOLN: INTRAVENOUS | Status: DC

## 2022-06-09 MED ORDER — MAGNESIUM SULFATE 2 GM/50ML IV SOLN
2.0000 g | Freq: Once | INTRAVENOUS | Status: AC
Start: 2022-06-09 — End: 2022-06-09
  Administered 2022-06-09: 2 g via INTRAVENOUS
  Filled 2022-06-09: qty 50

## 2022-06-09 MED ORDER — ZINC SULFATE 220 (50 ZN) MG PO CAPS
220.0000 mg | ORAL_CAPSULE | Freq: Every day | ORAL | Status: DC
Start: 2022-06-09 — End: 2022-06-09

## 2022-06-09 MED ORDER — ZINC SULFATE 220 (50 ZN) MG PO CAPS
220.0000 mg | ORAL_CAPSULE | Freq: Every day | ORAL | Status: DC
  Administered 2022-06-09 – 2022-06-14 (×6): 220 mg
  Filled 2022-06-09 (×6): qty 1

## 2022-06-09 MED ORDER — DEXTROSE 50 % IV SOLN
INTRAVENOUS | Status: AC
  Administered 2022-06-09: 50 mL via INTRAVENOUS
  Filled 2022-06-09: qty 50

## 2022-06-09 MED ORDER — ASCORBIC ACID 500 MG PO TABS
250.0000 mg | ORAL_TABLET | Freq: Two times a day (BID) | ORAL | Status: DC
  Administered 2022-06-09 – 2022-06-14 (×11): 250 mg
  Filled 2022-06-09 (×11): qty 1

## 2022-06-09 MED ORDER — DEXTROSE 50 % IV SOLN
12.5000 g | INTRAVENOUS | Status: AC
  Administered 2022-06-09: 12.5 g via INTRAVENOUS

## 2022-06-09 MED ORDER — DEXTROSE 50 % IV SOLN: 1.0000 | Freq: Once | INTRAVENOUS | Status: AC

## 2022-06-09 MED ORDER — VITAL AF 1.2 CAL PO LIQD
1000.0000 mL | ORAL | Status: DC
  Administered 2022-06-09 – 2022-06-14 (×5): 1000 mL
  Filled 2022-06-09 (×7): qty 1000

## 2022-06-09 NOTE — Progress Notes (Addendum)
Hypoglycemic Event  CBG: 68  Treatment: D50 25 mL (12.5 gm)  Symptoms: None  Follow-up CBG: Time:2009 CBG Result:124  Possible Reasons for Event: Unknown  Comments/MD notified:Elink Notified    Royal Piedra, RN

## 2022-06-09 NOTE — Procedures (Signed)
Extubation Procedure Note  Patient Details:   Name: Sherry Baker DOB: [birth year] MRN: [id number]   Airway Documentation:    Vent end date: 06/09/22 Vent end time: 1021   Evaluation  O2 sats: stable throughout Complications: No apparent complications Patient did tolerate procedure well. Bilateral Breath Sounds:  (coarse)   Patient extubated to 4L [place] per MD order. Patient had positive cuff leak prior to extubation. No stridor noted. RT will continue to monitor.  Flossie Buffy 06/09/2022, 10:21 AM

## 2022-06-09 NOTE — Progress Notes (Signed)
NAME:  Sherry Baker, MRN:  [id number], DOB:  04-21-1966, LOS: 4 ADMISSION DATE:  06/05/2022, CONSULTATION DATE:  8/5 REFERRING MD:  Nikki Dom, CHIEF COMPLAINT:  EtOH withdrawal   History of Present Illness:  56 y/o female admitted in setting of severe encephalopathy from benzodiazepine, cocaine and EtOH abuse.  Pertinent  Medical History  EtOH abuse HFrEF Hypertension IV drug use Enterococcus bacteremia CKD 3b Colon cancer s/p colostomy Bipolar disorder Chronic sacral decubitus ulcer MRSA pneumonia  Significant Hospital Events: Including procedures, antibiotic start and stop dates in addition to other pertinent events   8/3 admitted IMTS encephalopathy, UTI, ETOH withdrawal, urine culture ESBL Klebsiella 8/4 CT ab/pelvis due to urine leak from suprapubic fistula> no clear communication with bladder, liver normal, large sacral decub 8/5 intubated for airway protection in setting of worsening agitation; meropenem started  Interim History / Subjective:  Some agitation requiring fentanyl Remains mechanically ventilated Bradycardia with precedex> held  Objective   Blood pressure 136/82, pulse 70, temperature (!) 97.3 F (36.3 C), temperature source Axillary, resp. rate 15, height '5\' 2"'$  (1.575 m), weight 49.2 kg, SpO2 100 %.    Vent Mode: PRVC FiO2 (%):  [30 %] 30 % Set Rate:  [18 bmp] 18 bmp Vt Set:  [400 mL] 400 mL PEEP:  [5 cmH20] 5 cmH20 Plateau Pressure:  [11 cmH20-15 cmH20] 11 cmH20   Intake/Output Summary (Last 24 hours) at 06/09/2022 4098 Last data filed at 06/09/2022 0600 Gross per 24 hour  Intake 3315.9 ml  Output 750 ml  Net 2565.9 ml   Filed Weights   06/05/22 0706  Weight: 49.2 kg    Examination:  General:  In bed on vent HENT: NCAT ETT in place PULM: CTA B, vent supported breathing CV: RRR, no mgr GI: BS+, soft, nontender; colostomy noted, suprapubic ulceration leaking clear yellow fluid MSK: normal bulk and tone Derm: no spider angiomas Neuro: asleep,  stirs to voice, doesn't make purposeful movements   Resolved Hospital Problem list     Assessment & Plan:  Critically ill due to acute multifactorial encephalopathy requiring intubation for airway protection Toxic encephalopathy due to EtOH and cocaine use 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 and alcohol withdrawal Minimize sedation > hold now Wake up assessment now If ongoing encephalopathy consider MRI brain May need propofol today if tries to self extubate Phenobarbitol wean  ESBL Klebsiella UTI 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 Send urinalysis and urine creatinine from fluid from fistula Consider urology consult, need to discuss goals of care with her first Meropenem D/c vanc  Alcohol abuse but no cirrhosis on imaging Thiamine, folate  Acute respiratory failure with hypoxemia due to inability to protect airway COPD Full mechanical vent support VAP prevention Daily WUA/SBT Minimize sedation, consider attempting extubation   AGMA due to AKI > stable Monitor BMET and UOP Replace electrolytes as needed  Chronic sacral debuitus ulcer stage IV WOC consult and wound care  Rectal cancer s/p colostomy 2018 Colostomy care per routine  Protein calorie malnutrition Tube feeding  Normocytic anemia without bleeding Monitor for bleeding Transfuse PRBC for Hgb < 7 gm/dL    Best Practice (right click and "Reselect all SmartList Selections" daily)   Diet/type: tubefeeds DVT prophylaxis: LMWH GI prophylaxis: PPI Lines: N/A Foley:  Yes, and it is still needed Code Status:  limited Last date of multidisciplinary goals of care discussion [8/5 with cousin Crystal.  I tried Manufacturing engineer today, no answer.]  Critical care time: 45 minutes     Roselie Awkward, MD [facility] PCCM  Pager: [phone] Cell: [phone] After 7:00 pm call Elink  [phone]

## 2022-06-09 NOTE — Progress Notes (Signed)
Modified wound care orders and discontinued dressing changings for suprapubic area per Dr. Lake Bells.

## 2022-06-09 NOTE — Progress Notes (Signed)
Hypoglycemic Event  CBG: 59   Treatment: D50 25 mL (12.5 gm)  Symptoms: None  Follow-up CBG: DVOU:[id number]  CBG Result:136  Possible Reasons for Event: Unknown  Comments/MD notified:Dr. Lake Bells notified    Andrey Farmer

## 2022-06-09 NOTE — Progress Notes (Addendum)
Nutrition Follow-up  DOCUMENTATION CODES:   Severe malnutrition in context of chronic illness  INTERVENTION:   Resume tube feeding via Cortrak tube: Vital AF 1.2 at 60 ml/h (1440 ml per day)  Provides 1728 kcal, 108 gm protein, 1167 ml free water daily  MVI with minerals daily  -1 packet Juven BID, each packet provides 95 calories, 2.5 grams of protein (collagen), and 9.8 grams of carbohydrate (3 grams sugar); also contains 7 grams of L-arginine and L-glutamine, 300 mg vitamin C, 15 mg vitamin E, 1.2 mcg vitamin B-12, 9.5 mg zinc, 200 mg calcium, and 1.5 g  Calcium Beta-hydroxy-Beta-methylbutyrate to support wound healing  Zinc sulfate 220 mg BID per tube and vitamin C 250 mg BID per tube to aid in wound healing   NUTRITION DIAGNOSIS:   Severe Malnutrition related to social / environmental circumstances (polysubstance abuse) as evidenced by severe fat depletion, severe muscle depletion. Ongoing   GOAL:   Patient will meet greater than or equal to 90% of their needs Met with TF at goal   MONITOR:   TF tolerance, Diet advancement  REASON FOR ASSESSMENT:   Rounds Assessment of nutrition requirement/status, Enteral/tube feeding initiation and management  ASSESSMENT:   Pt with polysubstance abuse and daily alcohol abuse was admitted with altered mental status, admitted with acute toxic encephalopathy and alcohol withdrawal.  PCCM was consulted today patient was found to be severely encephalopathic, not able to protect her airway  Pt discussed during ICU rounds and with RN and MD.  Pt extubated this am. Pt unable to answer any questions. Plan for cortrak tube to continue TF until pt able to start diet and consume adequate nutrition.  Per CCM possible viscerocutaneous fistula.   8/05 - intubated for airway protection  8/07 - extubated; cortrak placed xray pending   Medications reviewed and include: MVI with minerals, Juven BID, phenobarbital, thiamine  D5 @ 125 ml/hr Mag  sulfate x 1  Labs reviewed:  CBG's: 53-125 - low after extubation and TF held   Micronutrient labs checked June 2023   NUTRITION - FOCUSED PHYSICAL EXAM:  Flowsheet Row Most Recent Value  Orbital Region Severe depletion  Upper Arm Region Severe depletion  Thoracic and Lumbar Region Severe depletion  Buccal Region Severe depletion  Temple Region Severe depletion  Clavicle Bone Region Severe depletion  Clavicle and Acromion Bone Region Severe depletion  Scapular Bone Region Severe depletion  Dorsal Hand Unable to assess  Patellar Region Severe depletion  Anterior Thigh Region Severe depletion  Posterior Calf Region Severe depletion  Edema (RD Assessment) None  Hair Reviewed  Eyes Unable to assess  Mouth Reviewed  [very poor dentition,  could not visualize tongue]  Skin Reviewed  Nails Unable to assess       Diet Order:   Diet Order     None       EDUCATION NEEDS:   Not appropriate for education at this time  Skin:  Skin Assessment: Skin Integrity Issues: Skin Integrity Issues:: Stage IV, Other (Comment) Stage IV: sacrum Other: non-pressure, full thickness wound to anus; open area to rt lower medial abdomen  Last BM:  Unknown  Height:   Ht Readings from Last 1 Encounters:  06/05/22 '5\' 2"'  (1.575 m)    Weight:   Wt Readings from Last 1 Encounters:  06/05/22 49.2 kg    BMI:  Body mass index is 19.84 kg/m.  Estimated Nutritional Needs:   Kcal:  1600-1800  Protein:  85-100 grams  Fluid:  >  1.6 L/day  Lockie Pares., RD, LDN, CNSC See AMiON for contact information

## 2022-06-09 NOTE — Procedures (Signed)
Cortrak ° °Tube Type:  Cortrak - 43 inches °Tube Location:  Left nare °Initial Placement:  Stomach °Secured by: Bridle °Technique Used to Measure Tube Placement:  Marking at nare/corner of mouth °Cortrak Secured At:  60 cm ° °Cortrak Tube Team Note: ° °Consult received to place a Cortrak feeding tube.  ° °X-ray is required, abdominal x-ray has been ordered by the Cortrak team. Please confirm tube placement before using the Cortrak tube.  ° °If the tube becomes dislodged please keep the tube and contact the Cortrak team at [email or web address] (password [email or web address]) for replacement.  °If after hours and replacement cannot be delayed, place a NG tube and confirm placement with an abdominal x-ray.  ° ° °Obi Scrima MS, RD, LDN °Please refer to AMION for RD and/or RD on-call/weekend/after hours pager ° ° °

## 2022-06-10 DIAGNOSIS — N179 Acute kidney failure, unspecified: Secondary | ICD-10-CM | POA: Diagnosis not present

## 2022-06-10 DIAGNOSIS — F1020 Alcohol dependence, uncomplicated: Secondary | ICD-10-CM | POA: Diagnosis not present

## 2022-06-10 DIAGNOSIS — F14129 Cocaine abuse with intoxication, unspecified: Secondary | ICD-10-CM

## 2022-06-10 DIAGNOSIS — G9340 Encephalopathy, unspecified: Secondary | ICD-10-CM | POA: Diagnosis not present

## 2022-06-10 DIAGNOSIS — R4182 Altered mental status, unspecified: Secondary | ICD-10-CM

## 2022-06-10 DIAGNOSIS — L988 Other specified disorders of the skin and subcutaneous tissue: Secondary | ICD-10-CM

## 2022-06-10 DIAGNOSIS — F1910 Other psychoactive substance abuse, uncomplicated: Secondary | ICD-10-CM

## 2022-06-10 LAB — COMPREHENSIVE METABOLIC PANEL
ALT: 9 U/L (ref 0–44)
AST: 13 U/L — ABNORMAL LOW (ref 15–41)
Albumin: 2.1 g/dL — ABNORMAL LOW (ref 3.5–5.0)
Alkaline Phosphatase: 71 U/L (ref 38–126)
Anion gap: 9 (ref 5–15)
BUN: 25 mg/dL — ABNORMAL HIGH (ref 6–20)
CO2: 25 mmol/L (ref 22–32)
Calcium: 8.5 mg/dL — ABNORMAL LOW (ref 8.9–10.3)
Chloride: 104 mmol/L (ref 98–111)
Creatinine, Ser: 1.02 mg/dL — ABNORMAL HIGH (ref 0.44–1.00)
GFR, Estimated: >60 mL/min (ref >60)
Glucose, Bld: 100 mg/dL — ABNORMAL HIGH (ref 70–99)
Potassium: 3.9 mmol/L (ref 3.5–5.1)
Sodium: 138 mmol/L (ref 135–145)
Total Bilirubin: 0.2 mg/dL — ABNORMAL LOW (ref 0.3–1.2)
Total Protein: 6.3 g/dL — ABNORMAL LOW (ref 6.5–8.1)

## 2022-06-10 LAB — CBC
HCT: 32.5 % — ABNORMAL LOW (ref 36.0–46.0)
Hemoglobin: 10.2 g/dL — ABNORMAL LOW (ref 12.0–15.0)
MCH: 27.1 pg (ref 26.0–34.0)
MCHC: 31.4 g/dL (ref 30.0–36.0)
MCV: 86.4 fL (ref 80.0–100.0)
Platelets: 241 10*3/uL (ref 150–400)
RBC: 3.76 MIL/uL — ABNORMAL LOW (ref 3.87–5.11)
RDW: 16.3 % — ABNORMAL HIGH (ref 11.5–15.5)
WBC: 4.5 10*3/uL (ref 4.0–10.5)
nRBC: 0 % (ref 0.0–0.2)

## 2022-06-10 LAB — GLUCOSE, CAPILLARY
Glucose-Capillary: 100 mg/dL — ABNORMAL HIGH (ref 70–99)
Glucose-Capillary: 111 mg/dL — ABNORMAL HIGH (ref 70–99)
Glucose-Capillary: 119 mg/dL — ABNORMAL HIGH (ref 70–99)
Glucose-Capillary: 81 mg/dL (ref 70–99)
Glucose-Capillary: 85 mg/dL (ref 70–99)
Glucose-Capillary: 92 mg/dL (ref 70–99)

## 2022-06-10 LAB — MAGNESIUM
Magnesium: 2.1 mg/dL (ref 1.7–2.4)
Magnesium: 2.1 mg/dL (ref 1.7–2.4)

## 2022-06-10 LAB — CULTURE, BLOOD (ROUTINE X 2)
Culture: NO GROWTH
Culture: NO GROWTH
Special Requests: ADEQUATE
Special Requests: ADEQUATE

## 2022-06-10 LAB — PHOSPHORUS: Phosphorus: 4.1 mg/dL (ref 2.5–4.6)

## 2022-06-10 MED ORDER — CHLORDIAZEPOXIDE HCL 25 MG PO CAPS
25.0000 mg | ORAL_CAPSULE | Freq: Once | ORAL | Status: AC
Start: 2022-06-10 — End: 2022-06-10
  Administered 2022-06-10: 25 mg via ORAL
  Filled 2022-06-10: qty 1

## 2022-06-10 MED ORDER — CHLORDIAZEPOXIDE HCL 25 MG PO CAPS
25.0000 mg | ORAL_CAPSULE | Freq: Four times a day (QID) | ORAL | Status: DC
  Administered 2022-06-10: 25 mg via ORAL
  Filled 2022-06-10: qty 1

## 2022-06-10 MED ORDER — CHLORDIAZEPOXIDE HCL 25 MG PO CAPS
25.0000 mg | ORAL_CAPSULE | Freq: Every day | ORAL | Status: AC
  Administered 2022-06-13: 25 mg
  Filled 2022-06-10: qty 1

## 2022-06-10 MED ORDER — CHLORDIAZEPOXIDE HCL 25 MG PO CAPS: 25.0000 mg | ORAL_CAPSULE | Freq: Three times a day (TID) | ORAL | Status: DC

## 2022-06-10 MED ORDER — CHLORDIAZEPOXIDE HCL 25 MG PO CAPS
25.0000 mg | ORAL_CAPSULE | Freq: Four times a day (QID) | ORAL | Status: AC | PRN
  Administered 2022-06-12 (×2): 25 mg
  Filled 2022-06-10 (×2): qty 1

## 2022-06-10 MED ORDER — CHLORDIAZEPOXIDE HCL 25 MG PO CAPS: 25.0000 mg | ORAL_CAPSULE | Freq: Four times a day (QID) | ORAL | Status: DC | PRN

## 2022-06-10 MED ORDER — CHLORHEXIDINE GLUCONATE CLOTH 2 % EX PADS: 6.0000 | MEDICATED_PAD | Freq: Every day | CUTANEOUS | Status: DC

## 2022-06-10 MED ORDER — CHLORDIAZEPOXIDE HCL 25 MG PO CAPS
25.0000 mg | ORAL_CAPSULE | Freq: Four times a day (QID) | ORAL | Status: AC
  Administered 2022-06-10 (×3): 25 mg
  Filled 2022-06-10 (×3): qty 1

## 2022-06-10 MED ORDER — LOPERAMIDE HCL 2 MG PO CAPS: 2.0000 mg | ORAL_CAPSULE | ORAL | Status: AC | PRN

## 2022-06-10 MED ORDER — CHLORDIAZEPOXIDE HCL 25 MG PO CAPS
25.0000 mg | ORAL_CAPSULE | Freq: Three times a day (TID) | ORAL | Status: AC
  Administered 2022-06-11 (×3): 25 mg
  Filled 2022-06-10 (×3): qty 1

## 2022-06-10 MED ORDER — HYDROXYZINE HCL 25 MG PO TABS
25.0000 mg | ORAL_TABLET | Freq: Four times a day (QID) | ORAL | Status: AC | PRN
  Administered 2022-06-12 – 2022-06-13 (×3): 25 mg
  Filled 2022-06-10 (×3): qty 1

## 2022-06-10 MED ORDER — ONDANSETRON 4 MG PO TBDP: 4.0000 mg | ORAL_TABLET | Freq: Four times a day (QID) | ORAL | Status: AC | PRN

## 2022-06-10 MED ORDER — HYDROXYZINE HCL 25 MG PO TABS: 25.0000 mg | ORAL_TABLET | Freq: Four times a day (QID) | ORAL | Status: DC | PRN

## 2022-06-10 MED ORDER — CHLORDIAZEPOXIDE HCL 25 MG PO CAPS: 25.0000 mg | ORAL_CAPSULE | ORAL | Status: DC

## 2022-06-10 MED ORDER — CHLORHEXIDINE GLUCONATE CLOTH 2 % EX PADS
6.0000 | MEDICATED_PAD | Freq: Every day | CUTANEOUS | Status: DC
  Administered 2022-06-11 – 2022-06-16 (×6): 6 via TOPICAL

## 2022-06-10 MED ORDER — CHLORDIAZEPOXIDE HCL 25 MG PO CAPS
25.0000 mg | ORAL_CAPSULE | ORAL | Status: AC
  Administered 2022-06-12 (×2): 25 mg
  Filled 2022-06-10 (×2): qty 1

## 2022-06-10 MED ORDER — UMECLIDINIUM-VILANTEROL 62.5-25 MCG/ACT IN AEPB
1.0000 | INHALATION_SPRAY | Freq: Every day | RESPIRATORY_TRACT | Status: DC
  Administered 2022-06-11 – 2022-06-16 (×6): 1 via RESPIRATORY_TRACT
  Filled 2022-06-10 (×2): qty 14

## 2022-06-10 MED ORDER — CHLORDIAZEPOXIDE HCL 25 MG PO CAPS: 25.0000 mg | ORAL_CAPSULE | Freq: Every day | ORAL | Status: DC

## 2022-06-10 MED ORDER — BUSPIRONE HCL 5 MG PO TABS
15.0000 mg | ORAL_TABLET | Freq: Two times a day (BID) | ORAL | Status: DC
  Administered 2022-06-10 – 2022-06-14 (×9): 15 mg
  Filled 2022-06-10: qty 3
  Filled 2022-06-10: qty 1
  Filled 2022-06-10: qty 3
  Filled 2022-06-10: qty 1
  Filled 2022-06-10 (×4): qty 3
  Filled 2022-06-10: qty 1

## 2022-06-10 NOTE — Evaluation (Signed)
Physical Therapy Evaluation Patient Details Name: Sherry Baker MRN: [id number] DOB: Jan 08, 1966 Today's Date: 06/10/2022  History of Present Illness  Patient is a 56 y/o female who presents on 8/3 with erratic behavior and aggression. 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 Possibly UTI. + cocaine, + BZD. Intubated 8/5-8/7. Recent admissons 5/3 and 6/3. PMH - colostomy, chronic pain, HTN, chronic opiod therapy, polysubstance abuse, Sacral decubitus ulcer, stage IV.  Clinical Impression  Patient presents with lethargy, pain, confusion, restlessness, generalized weakness, impaired balance and impaired mobility s/p above. Pt not able to provide necessary PLOF/history. Per chart, pt is from a crack house. Noted to have mumbling speech that is mostly unintelligible. Pt not following many commands. Requires assist of 2 for bed mobility and standing attempts (only able to get 3/4 of the way up) with flexed hips/knees. HR up to 128 bpm max with activity. Would benefit from SNF to maximize independence and mobility prior to return home. Will follow acutely. Hoping pt's mobility will improve when cognition/delirium clears.       Recommendations for follow up therapy are one component of a multi-disciplinary discharge planning process, led by the attending physician.  Recommendations may be updated based on patient status, additional functional criteria and insurance authorization.  Follow Up Recommendations Skilled nursing-short term rehab (<3 hours/day) Can patient physically be transported by private vehicle: No    Assistance Recommended at Discharge Frequent or constant Supervision/Assistance  Patient can return home with the following  Two people to help with walking and/or transfers;A lot of help with bathing/dressing/bathroom;Assistance with cooking/housework;Direct supervision/assist for medications  management;Assist for transportation;Help with stairs or ramp for entrance;Direct supervision/assist for financial management    Equipment Recommendations Rolling walker (2 wheels)  Recommendations for Other Services       Functional Status Assessment Patient has had a recent decline in their functional status and demonstrates the ability to make significant improvements in function in a reasonable and predictable amount of time.     Precautions / Restrictions Precautions Precautions: Fall;Other (comment) Precaution Comments: posey belt, bil mitts, NG, sacral wound Restrictions Weight Bearing Restrictions: No      Mobility  Bed Mobility Overal bed mobility: Needs Assistance Bed Mobility: Supine to Sit, Sit to Supine     Supine to sit: Max assist, +2 for physical assistance Sit to supine: Max assist, +2 for physical assistance   General bed mobility comments: Pt laying halfway down in the bed horizontally with knees/hips flexed, Max A to elevate trunk to sit upright    Transfers Overall transfer level: Needs assistance Equipment used: 2 person hand held assist Transfers: Sit to/from Stand Sit to Stand: Max assist, +2 physical assistance           General transfer comment: Attempted to stand from EOB, got 3/4 of the way up with flexed hips/knees. HR up to 128 bpm max    Ambulation/Gait               General Gait Details: UNable  Stairs            Wheelchair Mobility    Modified Rankin (Stroke Patients Only)       Balance Overall balance assessment: Needs assistance Sitting-balance support: Feet supported, No upper extremity supported Sitting balance-Leahy Scale: Poor Sitting balance - Comments: Max A to sit EOB.   Standing balance support: During functional activity Standing balance-Leahy Scale: Zero Standing balance comment:  Max A of 2 for standing balance, able to get 3/4 of way up with flexion posture esp at knees/hips                              Pertinent Vitals/Pain Pain Assessment Pain Assessment: Faces Faces Pain Scale: Hurts little more Pain Location: BLEs with extension/movement Pain Descriptors / Indicators: Grimacing, Guarding Pain Intervention(s): Monitored during session    Home Living Family/patient expects to be discharged to:: Other (Comment) (crack house)                   Additional Comments: Pt unable to provide information. Chart review for information.    Prior Function Prior Level of Function : Patient poor historian/Family not available               ADLs Comments: Pt unable to provide information     Hand Dominance   Dominant Hand: Right    Extremity/Trunk Assessment   Upper Extremity Assessment Upper Extremity Assessment: Defer to OT evaluation    Lower Extremity Assessment Lower Extremity Assessment: RLE deficits/detail;LLE deficits/detail;Difficult to assess due to impaired cognition;Generalized weakness RLE Deficits / Details: Favors knee/hip flexion as position of comfort even in attempted standing. Unable to extend passively as pt resists LLE Deficits / Details: Favors knee/hip flexion as position of comfort even in attempted standing. Unable to extend passively as pt resists       Communication   Communication: Expressive difficulties (mumbling, difficult to understand)  Cognition Arousal/Alertness: Lethargic Behavior During Therapy: Restless Overall Cognitive Status: Difficult to assess                                 General Comments: Pt not following commands well, confused, lethargic. Mumbling with nonsensical speech.        General Comments General comments (skin integrity, edema, etc.): HR up to 128 bpm max with activity. Sp02 high 90s-100s on RA.    Exercises     Assessment/Plan    PT Assessment Patient needs continued PT services  PT Problem List Decreased strength;Decreased mobility;Decreased safety awareness;Decreased  cognition;Decreased range of motion;Decreased skin integrity;Decreased balance       PT Treatment Interventions Therapeutic activities;DME instruction;Cognitive remediation;Patient/family education;Therapeutic exercise;Gait training;Balance training;Functional mobility training    PT Goals (Current goals can be found in the Care Plan section)  Acute Rehab PT Goals Patient Stated Goal: unable to state PT Goal Formulation: Patient unable to participate in goal setting Time For Goal Achievement: 06/24/22 Potential to Achieve Goals: Fair    Frequency Min 3X/week     Co-evaluation PT/OT/SLP Co-Evaluation/Treatment: Yes Reason for Co-Treatment: Complexity of the patient's impairments (multi-system involvement);Necessary to address cognition/behavior during functional activity;For patient/therapist safety;To address functional/ADL transfers PT goals addressed during session: Mobility/safety with mobility;Balance         AM-PAC PT "6 Clicks" Mobility  Outcome Measure Help needed turning from your back to your side while in a flat bed without using bedrails?: Total Help needed moving from lying on your back to sitting on the side of a flat bed without using bedrails?: Total Help needed moving to and from a bed to a chair (including a wheelchair)?: Total Help needed standing up from a chair using your arms (e.g., wheelchair or bedside chair)?: Total Help needed to walk in hospital room?: Total Help needed climbing 3-5 steps with a railing? : Total  6 Click Score: 6    End of Session   Activity Tolerance: Patient limited by lethargy;Other (comment) (cognition) Patient left: in bed;with call bell/phone within reach;with bed alarm set;with restraints reapplied Nurse Communication: Mobility status PT Visit Diagnosis: Unsteadiness on feet (R26.81);Difficulty in walking, not elsewhere classified (R26.2);Muscle weakness (generalized) (M62.81)    Time: 6659-9357 PT Time Calculation (min)  (ACUTE ONLY): 22 min   Charges:   PT Evaluation $PT Eval Moderate Complexity: 1 Mod          Marisa Severin, PT, DPT Acute Rehabilitation Services Secure chat preferred Office [place] 06/10/2022, 3:52 PM

## 2022-06-10 NOTE — Evaluation (Signed)
Occupational Therapy Evaluation Patient Details Name: Sherry Baker MRN: [id number] DOB: 08/15/1966 Today's Date: 06/10/2022   History of Present Illness Patient is a 56 y/o female who presents on 8/3 with erratic behavior and aggression. 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 Possibly UTI. + cocaine, + BZD. Intubated 8/5-8/7. Recent admissons 5/3 and 6/3. PMH - colostomy, chronic pain, HTN, chronic opiod therapy, polysubstance abuse, Sacral decubitus ulcer, stage IV.   Clinical Impression   [place] was evaluated s/p the above admission list, therapist familiar with pt form prior admission. Arnika was unable to provide PLOF or home set up due to impaired cognition and communication this date. Overall she required max A +3 for bed mobility and 2x attempts to stand. Due to deficits listed below she requires max A for ADLs at bed level. OT to continue to follow. Recommend d/c to SNF      Recommendations for follow up therapy are one component of a multi-disciplinary discharge planning process, led by the attending physician.  Recommendations may be updated based on patient status, additional functional criteria and insurance authorization.   Follow Up Recommendations  Skilled nursing-short term rehab (<3 hours/day)    Assistance Recommended at Discharge Frequent or constant Supervision/Assistance  Patient can return home with the following A lot of help with walking and/or transfers;A lot of help with bathing/dressing/bathroom;Assistance with cooking/housework;Assistance with feeding;Direct supervision/assist for medications management;Assist for transportation;Direct supervision/assist for financial management;Help with stairs or ramp for entrance    Functional Status Assessment  Patient has had a recent decline in their functional status and demonstrates the ability to make significant improvements  in function in a reasonable and predictable amount of time.  Equipment Recommendations  Other (comment) (pending progress)    Recommendations for Other Services       Precautions / Restrictions Precautions Precautions: Fall;Other (comment) Precaution Comments: posey belt, bil mitts, NG, sacral wound Restrictions Weight Bearing Restrictions: No      Mobility Bed Mobility Overal bed mobility: Needs Assistance Bed Mobility: Supine to Sit, Sit to Supine     Supine to sit: Max assist, +2 for physical assistance Sit to supine: Max assist, +2 for physical assistance   General bed mobility comments: Pt laying halfway down in the bed horizontally with knees/hips flexed, Max A to elevate trunk to sit upright    Transfers Overall transfer level: Needs assistance Equipment used: 2 person hand held assist Transfers: Sit to/from Stand Sit to Stand: Max assist, +2 physical assistance           General transfer comment: Attempted to stand from EOB, got 3/4 of the way up with flexed hips/knees. HR up to 128 bpm max      Balance Overall balance assessment: Needs assistance Sitting-balance support: Feet supported, No upper extremity supported Sitting balance-Leahy Scale: Poor Sitting balance - Comments: Max A to sit EOB.   Standing balance support: During functional activity Standing balance-Leahy Scale: Zero Standing balance comment: Max A of 2 for standing balance, able to get 3/4 of way up with flexion posture esp at knees/hips                           ADL either performed or assessed with clinical judgement   ADL Overall ADL's : Needs assistance/impaired Eating/Feeding: NPO   Grooming: Maximal assistance;Sitting   Upper Body Bathing: Maximal assistance;Bed level   Lower  Body Bathing: Maximal assistance;Bed level   Upper Body Dressing : Maximal assistance;Sitting   Lower Body Dressing: Maximal assistance;Bed level   Toilet Transfer: Maximal assistance;+2  for safety/equipment;+2 for physical assistance;Stand-pivot   Toileting- Clothing Manipulation and Hygiene: Maximal assistance;Sit to/from stand       Functional mobility during ADLs: Maximal assistance;+2 for safety/equipment;+2 for physical assistance General ADL Comments: impaired cognition, weakness, pain, BLE flexion     Vision Baseline Vision/History: 0 No visual deficits Vision Assessment?: Vision impaired- to be further tested in functional context Additional Comments: needs further assessment, pt not following commands this date     Perception     Praxis      Pertinent Vitals/Pain Pain Assessment Pain Assessment: Faces Faces Pain Scale: Hurts little more Pain Location: BLEs with extension/movement Pain Descriptors / Indicators: Grimacing, Guarding Pain Intervention(s): Limited activity within patient's tolerance, Monitored during session     Hand Dominance Right   Extremity/Trunk Assessment Upper Extremity Assessment Upper Extremity Assessment: RUE deficits/detail;LUE deficits/detail;Difficult to assess due to impaired cognition RUE Deficits / Details: holding in guarded position, seemingly week but difficult to assess due to impaire cog and communication LUE Deficits / Details: holding in guarded position, seemingly week but difficult to assess due to impaire cog and communication   Lower Extremity Assessment Lower Extremity Assessment: Defer to PT evaluation RLE Deficits / Details: Favors knee/hip flexion as position of comfort even in attempted standing. Unable to extend passively as pt resists LLE Deficits / Details: Favors knee/hip flexion as position of comfort even in attempted standing. Unable to extend passively as pt resists   Cervical / Trunk Assessment Cervical / Trunk Assessment: Normal   Communication Communication Communication: Expressive difficulties   Cognition Arousal/Alertness: Lethargic Behavior During Therapy: Restless Overall Cognitive  Status: Difficult to assess                                 General Comments: Pt not following commands well, confused, lethargic. Mumbling with nonsensical speech.     General Comments  VSS on RA    Exercises     Shoulder Instructions      Home Living Family/patient expects to be discharged to:: Other (Comment)                                 Additional Comments: Pt unable to provide information. Chart review for information.      Prior Functioning/Environment Prior Level of Function : Patient poor historian/Family not available             Mobility Comments: Per notes pt modified independent with walker or rollator ADLs Comments: Pt unable to provide information        OT Problem List: Decreased strength;Decreased range of motion;Decreased activity tolerance;Impaired balance (sitting and/or standing);Decreased coordination;Decreased safety awareness;Decreased cognition;Decreased knowledge of use of DME or AE;Decreased knowledge of precautions;Pain      OT Treatment/Interventions: Self-care/ADL training;Therapeutic exercise;DME and/or AE instruction;Therapeutic activities;Patient/family education;Balance training    OT Goals(Current goals can be found in the care plan section) Acute Rehab OT Goals Patient Stated Goal: unable to state OT Goal Formulation: Patient unable to participate in goal setting Time For Goal Achievement: 06/24/22 Potential to Achieve Goals: Fair ADL Goals Pt Will Perform Grooming: with supervision;standing Pt Will Perform Upper Body Dressing: with set-up;sitting Pt Will Perform Lower Body Dressing: with supervision;sit to/from stand Pt  Will Transfer to Toilet: with supervision;ambulating Additional ADL Goal #1: Pt will independently follow 2-3 step commands as a precursor to safe transition home  OT Frequency: Min 2X/week    Co-evaluation PT/OT/SLP Co-Evaluation/Treatment: Yes Reason for Co-Treatment: Complexity  of the patient's impairments (multi-system involvement);For patient/therapist safety;To address functional/ADL transfers PT goals addressed during session: Mobility/safety with mobility;Balance OT goals addressed during session: ADL's and self-care      AM-PAC OT "6 Clicks" Daily Activity     Outcome Measure Help from another person eating meals?: A Lot Help from another person taking care of personal grooming?: A Lot Help from another person toileting, which includes using toliet, bedpan, or urinal?: A Lot Help from another person bathing (including washing, rinsing, drying)?: A Lot Help from another person to put on and taking off regular upper body clothing?: A Lot Help from another person to put on and taking off regular lower body clothing?: A Lot 6 Click Score: 12   End of Session Nurse Communication: Mobility status  Activity Tolerance: Patient tolerated treatment well Patient left: in bed;with call bell/phone within reach;with bed alarm set  OT Visit Diagnosis: Unsteadiness on feet (R26.81);Other abnormalities of gait and mobility (R26.89);Muscle weakness (generalized) (M62.81);Adult, failure to thrive (R62.7);Pain                Time: 9628-3662 OT Time Calculation (min): 23 min Charges:  OT General Charges $OT Visit: 1 Visit OT Evaluation $OT Eval Moderate Complexity: 1 Mod    Kemaria Dedic A Rayhan Groleau 06/10/2022, 4:24 PM

## 2022-06-10 NOTE — Progress Notes (Signed)
   NAME:  Sherry Baker, MRN:  [id number], DOB:  1966-01-30, LOS: 5 ADMISSION DATE:  06/05/2022, CONSULTATION DATE:  8/5 REFERRING MD:  Nikki Dom, CHIEF COMPLAINT:  EtOH withdrawal   History of Present Illness:  56 y/o female admitted in setting of severe encephalopathy from benzodiazepine, cocaine and EtOH abuse.  Pertinent  Medical History  EtOH abuse HFrEF Hypertension IV drug use Enterococcus bacteremia CKD 3b Colon cancer s/p colostomy Bipolar disorder Chronic sacral decubitus ulcer MRSA pneumonia  Significant Hospital Events: Including procedures, antibiotic start and stop dates in addition to other pertinent events   8/3 admitted IMTS encephalopathy, UTI, ETOH withdrawal, urine culture ESBL Klebsiella 8/4 CT ab/pelvis due to urine leak from suprapubic fistula> no clear communication with bladder, liver normal, large sacral decub 8/5 intubated for airway protection in setting of worsening agitation; meropenem started 8/7 extubated.  8/8 added librium taper   Interim History / Subjective:  Very agitated   Objective   Blood pressure (Abnormal) 187/111, pulse (Abnormal) 113, temperature 98.4 F (36.9 C), temperature source Axillary, resp. rate (Abnormal) 23, height '5\' 2"'$  (1.575 m), weight 49.2 kg, SpO2 94 %.    Vent Mode: Stand-by FiO2 (%):  [30 %] 30 % PEEP:  [5 cmH20] 5 cmH20 Pressure Support:  [8 cmH20] 8 cmH20   Intake/Output Summary (Last 24 hours) at 06/10/2022 0828 Last data filed at 06/10/2022 0800 Gross per 24 hour  Intake 4123.97 ml  Output 6200 ml  Net -2076.03 ml   Filed Weights   06/05/22 0706  Weight: 49.2 kg    Examination: General restless. Moving around in bed. 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  Will cont Meropenem day 4 of 8  Will need Urology consult when delirium and infection improved.   Alcohol abuse but no cirrhosis on imaging Plan Cont thiamine and folate   Acute respiratory failure with hypoxemia due to inability to protect airway COPD Extubated 8/7 Plan Cont scheduled BDs (resumed Anoro ellipta) NPO (cont meds via NGT) Wean oxygen  Careful w/ sedation   H/o pulm nodules Plan F/u ou pt   Chronic sacral debuitus ulcer stage IV Plan Cont wound care   Rectal cancer s/p colostomy 2018 Plan Cont routine colostomy rx   Protein calorie malnutrition Plan Cont tube feeds  Normocytic anemia without bleeding Plan Trend cbc Trigger for transfusion < 7    Best Practice (right click and "Reselect all SmartList Selections" daily)   Diet/type: tubefeeds DVT prophylaxis: LMWH GI prophylaxis: PPI Lines: N/A Foley:  Yes, and it is still needed Code Status:  limited Last date of multidisciplinary goals of care discussion [8/7 with cousin Crystal.  Critical care time: 63 min    Erick Colace ACNP-BC [place] Pager # [phone] OR # [phone] if no answer

## 2022-06-11 DIAGNOSIS — G9340 Encephalopathy, unspecified: Secondary | ICD-10-CM | POA: Diagnosis not present

## 2022-06-11 LAB — COMPREHENSIVE METABOLIC PANEL
ALT: 9 U/L (ref 0–44)
AST: 11 U/L — ABNORMAL LOW (ref 15–41)
Albumin: 2.2 g/dL — ABNORMAL LOW (ref 3.5–5.0)
Alkaline Phosphatase: 63 U/L (ref 38–126)
Anion gap: 9 (ref 5–15)
BUN: 32 mg/dL — ABNORMAL HIGH (ref 6–20)
CO2: 23 mmol/L (ref 22–32)
Calcium: 8.9 mg/dL (ref 8.9–10.3)
Chloride: 106 mmol/L (ref 98–111)
Creatinine, Ser: 1.01 mg/dL — ABNORMAL HIGH (ref 0.44–1.00)
GFR, Estimated: >60 mL/min (ref >60)
Glucose, Bld: 96 mg/dL (ref 70–99)
Potassium: 3.8 mmol/L (ref 3.5–5.1)
Sodium: 138 mmol/L (ref 135–145)
Total Bilirubin: 0.3 mg/dL (ref 0.3–1.2)
Total Protein: 6.4 g/dL — ABNORMAL LOW (ref 6.5–8.1)

## 2022-06-11 LAB — GLUCOSE, CAPILLARY
Glucose-Capillary: 100 mg/dL — ABNORMAL HIGH (ref 70–99)
Glucose-Capillary: 100 mg/dL — ABNORMAL HIGH (ref 70–99)
Glucose-Capillary: 80 mg/dL (ref 70–99)
Glucose-Capillary: 88 mg/dL (ref 70–99)
Glucose-Capillary: 89 mg/dL (ref 70–99)
Glucose-Capillary: 95 mg/dL (ref 70–99)

## 2022-06-11 LAB — MAGNESIUM
Magnesium: 2.1 mg/dL (ref 1.7–2.4)
Magnesium: 2.1 mg/dL (ref 1.7–2.4)

## 2022-06-11 LAB — CBC
HCT: 30.3 % — ABNORMAL LOW (ref 36.0–46.0)
Hemoglobin: 9.5 g/dL — ABNORMAL LOW (ref 12.0–15.0)
MCH: 27.1 pg (ref 26.0–34.0)
MCHC: 31.4 g/dL (ref 30.0–36.0)
MCV: 86.6 fL (ref 80.0–100.0)
Platelets: 282 10*3/uL (ref 150–400)
RBC: 3.5 MIL/uL — ABNORMAL LOW (ref 3.87–5.11)
RDW: 16.6 % — ABNORMAL HIGH (ref 11.5–15.5)
WBC: 4.2 10*3/uL (ref 4.0–10.5)
nRBC: 0 % (ref 0.0–0.2)

## 2022-06-11 MED ORDER — POTASSIUM CHLORIDE 20 MEQ PO PACK
20.0000 meq | PACK | Freq: Once | ORAL | Status: AC
  Administered 2022-06-11: 20 meq
  Filled 2022-06-11: qty 1

## 2022-06-11 MED ORDER — THIAMINE MONONITRATE 100 MG PO TABS
100.0000 mg | ORAL_TABLET | Freq: Every day | ORAL | Status: DC
  Administered 2022-06-11 – 2022-06-14 (×4): 100 mg
  Filled 2022-06-11 (×6): qty 1

## 2022-06-11 MED ORDER — ALBUTEROL SULFATE (2.5 MG/3ML) 0.083% IN NEBU
2.5000 mg | INHALATION_SOLUTION | RESPIRATORY_TRACT | Status: DC | PRN
Start: 2022-06-11 — End: 2022-06-16

## 2022-06-11 NOTE — Progress Notes (Signed)
Pharmacy Antibiotic Note  Sherry Baker is a 56 y.o. female admitted on 06/05/2022 with  ESBL UTI and aspiration pneumonia .  Pharmacy has been consulted for Meropenem dosing.  Renal function remains stable at 40-50 mL/min. UOP 3 mL/kg/hr.   Plan: Continue meropenem 1g IV q12h x 7 days (stop date entered for 8/11) Monitor daily WBC, temp, SCr, and clinical s/sx of infection.  Height: '5\' 2"'$  (157.5 cm) Weight: 49.2 kg (108 lb 7.5 oz) IBW/kg (Calculated) : 50.1  Temp (24hrs), Avg:97.8 F (36.6 C), Min:96 F (35.6 C), Max:98.4 F (36.9 C)  Recent Labs  Lab 06/05/22 0740 06/05/22 1300 06/06/22 0537 06/06/22 1110 06/07/22 1542 06/08/22 0216 06/09/22 0652 06/10/22 0612 06/11/22 0556  WBC  --   --  6.2  --   --  3.9* 4.5 4.5 4.2  CREATININE  --    < > 1.55*   < > 1.05* 1.11* 1.16* 1.02* 1.01*  LATICACIDVEN 1.1  --   --   --   --   --   --   --   --    < > = values in this interval not displayed.     Estimated Creatinine Clearance: 49.5 mL/min (A) (by C-G formula based on SCr of 1.01 mg/dL (H)).    Allergies  Allergen Reactions   Penicillins Hives and Swelling    Tolerated Unasyn, Rocephin Did it involve swelling of the face/tongue/throat, SOB, or low BP? Yes Did it involve sudden or severe rash/hives, skin peeling, or any reaction on the inside of your mouth or nose? No Did you need to seek medical attention at a hospital or doctor's office? No When did it last happen?  <10 years   If all above answers are "NO", may proceed with cephalosporin use.     Antimicrobials this admission: Ceftriaxone 8/3 >> 8/5 Vancomycin 8/5 >> 8/7 Meropenem 8/5 >>   Microbiology results: 8/3 BCx: NGTD 8/3 UCx: >100K ESBL Kleb pneumoniae (S: gent, imipenem, Zosyn (MIC 16) 8/4 MRSA PCR: not detected  [name], PharmD 06/11/2022 11:28 AM

## 2022-06-11 NOTE — Progress Notes (Signed)
Physical Therapy Treatment Patient Details Name: Sherry Baker MRN: [id number] DOB: 01/31/1966 Today's Date: 06/11/2022   History of Present Illness Patient is a 56 y/o female who presents on 8/3 with erratic behavior and aggression. 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 Possibly UTI. + cocaine, + BZD. Intubated 8/5-8/7. Recent admissons 5/3 and 6/3. PMH - colostomy, chronic pain, HTN, chronic opiod therapy, polysubstance abuse, Sacral decubitus ulcer, stage IV.    PT Comments    Pt continues to be lethargic and confused, only following ~10% of simple commands today. Pt initially agreeable to session, but not actively initiating movements when cued. Once provided TA to sit up EOB she became resistive, trying to return to supine. Pt declined any further mobility or exercises, even though provided education on importance of mobility to reduce deconditioning. Will continue to follow acutely. Current recommendations remain appropriate.     Recommendations for follow up therapy are one component of a multi-disciplinary discharge planning process, led by the attending physician.  Recommendations may be updated based on patient status, additional functional criteria and insurance authorization.  Follow Up Recommendations  Skilled nursing-short term rehab (<3 hours/day) Can patient physically be transported by private vehicle: No   Assistance Recommended at Discharge Frequent or constant Supervision/Assistance  Patient can return home with the following Two people to help with walking and/or transfers;A lot of help with bathing/dressing/bathroom;Assistance with cooking/housework;Direct supervision/assist for medications management;Assist for transportation;Help with stairs or ramp for entrance;Direct supervision/assist for financial management   Equipment Recommendations  Rolling walker (2 wheels)     Recommendations for Other Services       Precautions / Restrictions Precautions Precautions: Fall;Other (comment) Precaution Comments: posey belt, bil mitts, NG, sacral wound Restrictions Weight Bearing Restrictions: No     Mobility  Bed Mobility Overal bed mobility: Needs Assistance Bed Mobility: Rolling, Supine to Sit, Sit to Supine Rolling: Supervision   Supine to sit: Total assist, HOB elevated Sit to supine: Max assist, HOB elevated   General bed mobility comments: Pt rolling with supervision. Pt not following cues to assist with bringing legs off bed or ascending trunk, thereby needing TA for supine > sit. Pt pushing herself back into bed, maxA to manage legs.    Transfers                   General transfer comment: deferred due to pt not following commands and level of arousal    Ambulation/Gait               General Gait Details: deferred   Stairs             Wheelchair Mobility    Modified Rankin (Stroke Patients Only)       Balance Overall balance assessment: Needs assistance Sitting-balance support: Feet supported, No upper extremity supported Sitting balance-Leahy Scale: Poor Sitting balance - Comments: Max A-TA to sit EOB as pt was trying to push herself back to the L to lay down, tolerated only ~30 sec.       Standing balance comment: deferred                            Cognition Arousal/Alertness: Lethargic Behavior During Therapy: Restless Overall Cognitive Status: Difficult to assess  General Comments: Pt not following commands consistently (~10%) and remains confused and restless. Mumbling with nonsensical speech majority of time, but intermittently could understand pt. Pt agreeable initially, but once sitting EOB she became resistive        Exercises      General Comments        Pertinent Vitals/Pain Pain Assessment Pain Assessment: Faces Faces Pain  Scale: Hurts little more Pain Location: ankles, buttocks Pain Descriptors / Indicators: Grimacing, Guarding Pain Intervention(s): Limited activity within patient's tolerance, Monitored during session, Repositioned    Home Living                          Prior Function            PT Goals (current goals can now be found in the care plan section) Acute Rehab PT Goals Patient Stated Goal: to get better PT Goal Formulation: Patient unable to participate in goal setting Time For Goal Achievement: 06/24/22 Potential to Achieve Goals: Fair Progress towards PT goals: Not progressing toward goals - comment (remains limited by lethargy and confusion)    Frequency    Min 3X/week      PT Plan Current plan remains appropriate    Co-evaluation              AM-PAC PT "6 Clicks" Mobility   Outcome Measure  Help needed turning from your back to your side while in a flat bed without using bedrails?: A Little Help needed moving from lying on your back to sitting on the side of a flat bed without using bedrails?: Total Help needed moving to and from a bed to a chair (including a wheelchair)?: Total Help needed standing up from a chair using your arms (e.g., wheelchair or bedside chair)?: Total Help needed to walk in hospital room?: Total Help needed climbing 3-5 steps with a railing? : Total 6 Click Score: 8    End of Session   Activity Tolerance: Patient limited by lethargy;Other (comment) (cognition) Patient left: in bed;with call bell/phone within reach;with bed alarm set;with restraints reapplied Nurse Communication: Mobility status PT Visit Diagnosis: Unsteadiness on feet (R26.81);Difficulty in walking, not elsewhere classified (R26.2);Muscle weakness (generalized) (M62.81)     Time: 1448-1856 PT Time Calculation (min) (ACUTE ONLY): 19 min  Charges:  $Therapeutic Activity: 8-22 mins                     Moishe Spice, PT, DPT Acute Rehabilitation  Services  Office: [phone]    Orvan Falconer 06/11/2022, 2:31 PM

## 2022-06-11 NOTE — TOC Progression Note (Signed)
Transition of Care [facility]) - Progression Note    Patient Details  Name: Sherry Baker MRN: [id number] Date of Birth: [birth year]  [facility] CM/SW [place], LCSW Phone Number: 06/11/2022, 10:15 AM  Clinical Narrative:    CSW following. Acknowledges SNF recommendation with multiple barriers present at this time.         Expected Discharge Plan and Services                                                 Social Determinants of Health (SDOH) Interventions    Readmission Risk Interventions    04/17/2022   11:21 AM 12/10/2021    1:58 PM 11/15/2021    5:02 PM  Readmission Risk Prevention Plan  Transportation Screening Complete Complete Complete  Medication Review Press photographer) Complete Complete Complete  PCP or Specialist appointment within 3-5 days of discharge Complete Complete Complete  HRI or Home Care Consult Complete Not Complete   HRI or Home Care Consult Pt Refusal Comments  history of unsafe home environment   SW Recovery Care/Counseling Consult Complete Complete   Palliative Care Screening Not Applicable Not Applicable Not [place] Patient Refused Not Applicable Not Applicable

## 2022-06-11 NOTE — Progress Notes (Signed)
Discussed with Dr. Lake Bells, patient ready for transfer out of ICU. IMTS will assume care at 7am on 06/11/2022.

## 2022-06-11 NOTE — Progress Notes (Signed)
RN performing wound care, adding updated photo of sacrum/buttocks.      New bilateral stage 2 found.  Left stage 2: 3cm x 1.5cm  Right stage 2: 2cm x 1cm   Stage 4 sacrum: 4cm x 2cm x 1cm   Non pressure wound to anus: 8cm x 3cm x 3cm

## 2022-06-11 NOTE — Progress Notes (Signed)
   NAME:  Sherry Baker, MRN:  [id number], DOB:  September 11, 1966, LOS: 6 ADMISSION DATE:  06/05/2022, CONSULTATION DATE:  8/5 REFERRING MD:  Nikki Dom, CHIEF COMPLAINT:  EtOH withdrawal   History of Present Illness:  56 y/o female admitted in setting of severe encephalopathy from benzodiazepine, cocaine and EtOH abuse.  Pertinent  Medical History  EtOH abuse HFrEF Hypertension IV drug use Enterococcus bacteremia CKD 3b Colon cancer s/p colostomy Bipolar disorder Chronic sacral decubitus ulcer MRSA pneumonia  Significant Hospital Events: Including procedures, antibiotic start and stop dates in addition to other pertinent events   8/3 admitted IMTS encephalopathy, UTI, ETOH withdrawal, urine culture ESBL Klebsiella 8/4 CT ab/pelvis due to urine leak from suprapubic fistula> no clear communication with bladder, liver normal, large sacral decub 8/5 intubated for airway protection in setting of worsening agitation; meropenem started 8/7 extubated 8/8 started on librium  Interim History / Subjective:   No acute events More awake and alert today but still slurring words  Objective   Blood pressure 124/61, pulse (!) 102, temperature 98.1 F (36.7 C), temperature source Axillary, resp. rate 15, height '5\' 2"'$  (1.575 m), weight 49.2 kg, SpO2 99 %.        Intake/Output Summary (Last 24 hours) at 06/11/2022 0748 Last data filed at 06/11/2022 0700 Gross per 24 hour  Intake 2352.65 ml  Output 3705 ml  Net -1352.35 ml   Filed Weights   06/05/22 0706  Weight: 49.2 kg    Examination:  General:  Chronically ill appearing, resting comfortably in bed HENT: NCAT OP clear PULM: CTA B, normal effort CV: RRR, no mgr GI: BS+, soft, nontender, ostomies in place MSK: normal bulk and tone Neuro: awake, speaking to me with slurred speech    Resolved Hospital Problem list   acute multifactorial encephalopathy requiring intubation for airway protection Acute respiratory failure with hypoxemia due to  inability to protect airway  Assessment & Plan:  Toxic encephalopathy due to EtOH and cocaine use 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 and alcohol withdrawal Librium taper ordered Phenobarb taper ordered Thiamine, folate CIWA Frequent orientation  ESBL Klebsiella UTI Vesiculocutaneous fistula Meropenem to continue When the patient is able to speak clearly and discuss follow up plans then we should consult urology, at this point will not consult them unless she can make plans for follow up  Alcohol abuse but no cirrhosis on imaging Thiamine folate  COPD Anoro Albuterol prn  AGMA due to AKI > stable Monitor BMET and UOP Replace electrolytes as needed  Chronic sacral debuitus ulcer stage IV Wound care per [place] team  Rectal cancer s/p colostomy 2018 Colostomy care per routine  Protein calorie malnutrition Tube feeding to continue Advance diet as mental status allows for safe swallowing  Normocytic anemia without bleeding Monitor for bleeding Transfuse PRBC for Hgb < 7 gm/dL   Best Practice (right click and "Reselect all SmartList Selections" daily)   Diet/type: tubefeeds DVT prophylaxis: LMWH GI prophylaxis: PPI Lines: N/A Foley:  Yes, and it is still needed Code Status:  limited Last date of multidisciplinary goals of care discussion [8/5 with cousin Crystal.  Updated her on 8/7 as well.]  Move out of ICU to [facility] service  Critical care time: n/a minutes     Roselie Awkward, MD [place] [facility] Pager: [phone] Cell: [phone] After 7:00 pm call Elink  [phone]

## 2022-06-11 NOTE — Progress Notes (Signed)
Pt moving around in the bed

## 2022-06-12 ENCOUNTER — Other Ambulatory Visit (HOSPITAL_COMMUNITY): Payer: Medicaid Other

## 2022-06-12 DIAGNOSIS — E46 Unspecified protein-calorie malnutrition: Secondary | ICD-10-CM

## 2022-06-12 DIAGNOSIS — Z1612 Extended spectrum beta lactamase (ESBL) resistance: Secondary | ICD-10-CM

## 2022-06-12 DIAGNOSIS — G9349 Other encephalopathy: Secondary | ICD-10-CM | POA: Diagnosis not present

## 2022-06-12 DIAGNOSIS — F1090 Alcohol use, unspecified, uncomplicated: Secondary | ICD-10-CM | POA: Diagnosis not present

## 2022-06-12 DIAGNOSIS — F19980 Other psychoactive substance use, unspecified with psychoactive substance-induced anxiety disorder: Secondary | ICD-10-CM

## 2022-06-12 DIAGNOSIS — N322 Vesical fistula, not elsewhere classified: Secondary | ICD-10-CM

## 2022-06-12 DIAGNOSIS — N390 Urinary tract infection, site not specified: Secondary | ICD-10-CM | POA: Diagnosis not present

## 2022-06-12 LAB — BASIC METABOLIC PANEL
Anion gap: 9 (ref 5–15)
BUN: 40 mg/dL — ABNORMAL HIGH (ref 6–20)
CO2: 19 mmol/L — ABNORMAL LOW (ref 22–32)
Calcium: 8.8 mg/dL — ABNORMAL LOW (ref 8.9–10.3)
Chloride: 107 mmol/L (ref 98–111)
Creatinine, Ser: 1.05 mg/dL — ABNORMAL HIGH (ref 0.44–1.00)
GFR, Estimated: >60 mL/min (ref >60)
Glucose, Bld: 97 mg/dL (ref 70–99)
Potassium: 4.2 mmol/L (ref 3.5–5.1)
Sodium: 135 mmol/L (ref 135–145)

## 2022-06-12 LAB — CBC
HCT: 32.4 % — ABNORMAL LOW (ref 36.0–46.0)
Hemoglobin: 10.1 g/dL — ABNORMAL LOW (ref 12.0–15.0)
MCH: 27 pg (ref 26.0–34.0)
MCHC: 31.2 g/dL (ref 30.0–36.0)
MCV: 86.6 fL (ref 80.0–100.0)
Platelets: 259 10*3/uL (ref 150–400)
RBC: 3.74 MIL/uL — ABNORMAL LOW (ref 3.87–5.11)
RDW: 16.5 % — ABNORMAL HIGH (ref 11.5–15.5)
WBC: 5 10*3/uL (ref 4.0–10.5)
nRBC: 0 % (ref 0.0–0.2)

## 2022-06-12 LAB — MAGNESIUM
Magnesium: 2.1 mg/dL (ref 1.7–2.4)
Magnesium: 2 mg/dL (ref 1.7–2.4)

## 2022-06-12 LAB — GLUCOSE, CAPILLARY
Glucose-Capillary: 101 mg/dL — ABNORMAL HIGH (ref 70–99)
Glucose-Capillary: 107 mg/dL — ABNORMAL HIGH (ref 70–99)
Glucose-Capillary: 80 mg/dL (ref 70–99)

## 2022-06-12 NOTE — Progress Notes (Signed)
Dr. Lisabeth Devoid made aware of pt's high CIWA score and pt being highly restless in bed.  Per MD, will keep prn and scheduled orders for withdrawal at this time.

## 2022-06-12 NOTE — Evaluation (Signed)
Clinical/Bedside Swallow Evaluation Patient Details  Name: Sherry Baker MRN: [id number] Date of Birth: [birth year]  Today's Date: 06/12/2022 Time: SLP Start Time (ACUTE ONLY): 1135 SLP Stop Time (ACUTE ONLY): 1155 SLP Time Calculation (min) (ACUTE ONLY): 20 min  Past Medical History:  Past Medical History:  Diagnosis Date   Acquired hypothyroidism    Alcohol abuse    Allergy    PCNS swelling   Arthritis    Bipolar 1 disorder ([place])    Cancer ([place]) 01/21/2017   rectal cancer   Cancer ([place])    Chronic kidney disease    Chronic pain syndrome    Cirrhosis ([place])    Cirrhosis of liver ([place])    Cocaine abuse ([place])    COVID-19 virus infection 06/23/2021   Delirium tremens ([place]) 06/22/2021   Depression    Genetic testing 03/24/2017   Ms. Folk underwent genetic counseling and testing for hereditary cancer syndromes on 02/17/2017. Her results were negative for mutations in all 46 genes analyzed by [facility]'s 46-gene Common Hereditary Cancers Panel. Genes analyzed include: APC, ATM, AXIN2, BARD1, BMPR1A, BRCA1, BRCA2, BRIP1, CDH1, CDKN2A, CHEK2, CTNNA1, DICER1, EPCAM, GREM1, HOXB13, KIT, MEN1, MLH1, MSH2, MSH3, MSH6, MUTYH, NBN,   HTN (hypertension)    Hypertension    Polysubstance abuse ([place])    Rectal cancer ([place])    Suicidal ideation 02/11/2014   Past Surgical History:  Past Surgical History:  Procedure Laterality Date   ABDOMINAL PERINEAL BOWEL RESECTION N/A 06/18/2017   Procedure: ABDOMINAL PERINEAL RESECTION ERAS PATHWAY;  Surgeon: Leighton Ruff, MD;  Location: [facility];  Service: General;  Laterality: N/A;   BUBBLE STUDY  11/29/2021   Procedure: BUBBLE STUDY;  Surgeon: Berniece Salines, DO;  Location: [facility];  Service: Cardiovascular;;   COLON SURGERY     COLONOSCOPY WITH PROPOFOL Left 01/21/2017   Procedure: COLONOSCOPY WITH PROPOFOL;  Surgeon: Otis Brace, MD;  Location: [facility];  Service: Gastroenterology;  Laterality: Left;   Colostomy     FRACTURE SURGERY      MANDIBLE FRACTURE SURGERY     TEE WITHOUT CARDIOVERSION N/A 11/29/2021   Procedure: TRANSESOPHAGEAL ECHOCARDIOGRAM (TEE);  Surgeon: Berniece Salines, DO;  Location: [facility];  Service: Cardiovascular;  Laterality: N/A;   HPI:  Patient is a 56 y/o female who presents on 8/3 with erratic behavior and aggression. 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 Possibly UTI. + cocaine, + BZD. Intubated for airway protection in setting of worsening agitation ETT 8/5-8/7 [facility] not completed. PMH - colostomy, chronic pain, HTN, chronic opiod therapy, polysubstance abuse, Sacral decubitus ulcer, stage IV. Recent admissons 5/3 and 6/3. Pt was seen by SLP 04/14/22 with recommendation for dysphagia 2/thin and subsequent advancement to dysphagia 3/thin.    Assessment / Plan / Recommendation  Clinical Impression  Pt was seen for bedside swallow evaluation. She was intermittently agitated during the evaluation and quite restless in the bed. Pt was unable to provide any meaningful history or participate in a complete oral mechanism exam due to her current mentation. She exhibited symptoms or oropharyngeal dysphagia characterized by reduced bolus awareness with oral holding and either prolonged or inadequate bolus manipulation, and signs of aspiration. Pt presented with a wet vocal quality at baseline which was improved with prompted coughing. Throat clearing and coughing were noted with a 1/2 tsp boluses of puree, with thin liquids and with boluses of multiple ice chips. It is recommended that the pt's  NPO status be maintained, but that individual ice chips be allowed after oral care. SLP will follow pt. SLP Visit Diagnosis: Dysphagia, unspecified (R13.10)    Aspiration Risk  Mild aspiration risk;Moderate aspiration risk    Diet Recommendation NPO;Alternative means - temporary;Ice chips PRN after oral care (individual ice  chips)   Medication Administration: Via alternative means Compensations: Small sips/bites;Slow rate Postural Changes: Seated upright at 90 degrees    Other  Recommendations Oral Care Recommendations: Oral care BID    Recommendations for follow up therapy are one component of a multi-disciplinary discharge planning process, led by the attending physician.  Recommendations may be updated based on patient status, additional functional criteria and insurance authorization.  Follow up Recommendations  (TBD)      Assistance Recommended at Discharge Frequent or constant Supervision/Assistance  Functional Status Assessment Patient has had a recent decline in their functional status and demonstrates the ability to make significant improvements in function in a reasonable and predictable amount of time.  Frequency and Duration min 2x/week  2 weeks       Prognosis Prognosis for Safe Diet Advancement: Good Barriers to Reach Goals: Severity of deficits      Swallow Study   General Date of Onset: 06/09/22 HPI: Patient is a 56 y/o female who presents on 8/3 with erratic behavior and aggression. 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 Possibly UTI. + cocaine, + BZD. Intubated for airway protection in setting of worsening agitation ETT 8/5-8/7 [facility] not completed. PMH - colostomy, chronic pain, HTN, chronic opiod therapy, polysubstance abuse, Sacral decubitus ulcer, stage IV. Recent admissons 5/3 and 6/3. Pt was seen by SLP 04/14/22 with recommendation for dysphagia 2/thin and subsequent advancement to dysphagia 3/thin. Type of Study: Bedside Swallow Evaluation Previous Swallow Assessment: See HPI Diet Prior to this Study: NPO;NG Tube Temperature Spikes Noted: No Respiratory Status: Room air History of Recent Intubation: Yes Length of Intubations (days): 2 days Date extubated:  06/09/22 Behavior/Cognition: Alert;Confused;Doesn't follow directions;Requires cueing Oral Cavity Assessment: Dry;Dried secretions Oral Care Completed by SLP: Recent completion by staff Oral Cavity - Dentition: Poor condition;Missing dentition Self-Feeding Abilities: Total assist Patient Positioning: Upright in bed;Postural control adequate for testing Baseline Vocal Quality: Wet;Hoarse Volitional Cough: Wet;Congested Volitional Swallow: Unable to elicit    Oral/Motor/Sensory Function Overall Oral Motor/Sensory Function:  (difficult to assess)   Ice Chips Ice chips: Impaired Presentation: Spoon Oral Phase Impairments: Poor awareness of bolus Oral Phase Functional Implications: Oral holding;Prolonged oral transit Pharyngeal Phase Impairments: Wet Vocal Quality;Throat Clearing - Immediate;Cough - Immediate;Cough - Delayed   Thin Liquid Thin Liquid: Impaired Presentation: Cup;Straw Oral Phase Impairments: Poor awareness of bolus Oral Phase Functional Implications: Prolonged oral transit Pharyngeal  Phase Impairments: Cough - Immediate;Cough - Delayed;Wet Vocal Quality;Throat Clearing - Immediate    Nectar Thick Nectar Thick Liquid: Not tested   Honey Thick Honey Thick Liquid: Not tested   Puree Puree: Impaired Presentation: Spoon Oral Phase Impairments: Poor awareness of bolus Oral Phase Functional Implications: Oral holding;Prolonged oral transit Pharyngeal Phase Impairments: Cough - Immediate;Cough - Delayed   Solid     Solid: Not tested     Zedekiah Hinderman I. Hardin Negus, [place], [place] Office number [place] 06/12/2022,1:07 PM

## 2022-06-12 NOTE — Progress Notes (Addendum)
Summary: *Sherry Baker is a 56 year old person with a PMH of rectal cancer s/p colostomy in 2018, polysubstance use, wound dehiscence, decubitus ulcers who was admitted for acute encephalopathy due secondary to substance use (cocaine and benzodiazapine) and alcohol withdrawal. Patient was found rolling in the grass with urinary incontinence. Patient noted with worsening encephalopathy on 8/5 with increased CIWA score and inability to protect airway. She was intubated and placed on precedex and phenobarbital for EtOH withdrawal. Admission complicated by ESBL klebsiella UTI, AKI, . Subequently improved and was extubated on 8/7 and off precedex. Currently on libirum taper and phenobarbital.  ESBL klesiella UTI placed on meropenem for 7days on 8/5 last day 8/12  Recent admission 5/23 for encephalopathy from enterococcus bacteremia Urine leak from suprapubic fistuala CT   Subjective: Patient reports headache this morning and expresses desire to eat. Somewhat restless in bed. Becomes irritable with questions and continues to request food.  RN reports patient was very restless in bed this morning and intermittently shouting. Given as needed 25 mg librium. She seems calmer on my exam after extra dose of librium     Objective:  Vital signs in last 24 hours: Vitals:   06/11/22 1703 06/11/22 1751 06/12/22 0000 06/12/22 0426  BP: (!) 192/81 (!) 145/78 (!) 116/42 (!) 120/43  Pulse: 99 (!) 106 93 94  Resp: '15 18 17 19  '$ Temp: (!) 97.5 F (36.4 C)  98 F (36.7 C) 98 F (36.7 C)  TempSrc: Oral  Oral Oral  SpO2: 100% 100% 96% 98%  Weight:      Height:       Constitutional: Laying in bed, restless, in no acute distress HENT: Normocephalic and atraumatic, NG tube in place Cardiovascular: Normal rate, regular rhythm, no murmurs, rubs, gallops.   Respiratory: Lungs are clear to auscultation bilaterally. No increased work of breathing GI: ostomy of the LLQ, no distension,  Musculoskeletal: cachectic.  No peripheral edema noted. Neurological: alert only oriented to self, mumbling, no tremor, follows commands Skin: Sacral wound dressed and pack, appear clean and intact Psychiatric: restless, intermittently agitated, poor attention  Assessment/Plan:  Principal Problem:   Acute encephalopathy Active Problems:   Rectal cancer ([place])   Polysubstance abuse with associated depression and anxiety   Alcohol use disorder, severe, dependence ([place])   AKI (acute kidney injury) ([place])   Chronic Sacral wound, subsequent encounter   UTI (urinary tract infection)   High anion gap metabolic acidosis   Cocaine abuse with intoxication ([place])   Fistula  Acute encephalopathy Polysubstance use disorder Alcohol use disorder with history of DT Similar admission 04/2022. Worsening agitation and withdrawal symptoms and transferred to ICU on 8/5. Intubated and treated with precedex, phenobarbital. Extubated on 8/7. Now on librium and phenobarbital taper.  Awake this morning, agitated this morning required PRN librium with improvement.  No signs of respiratory depression -Continue librium taper 25 mg twice daily today, and 25 mg once daily, librium 25 mg q6 PRN, if requirement more PRN will extend taper - Phenobarbital taper in ICU, now on phenobarbital 32.4 mg every 8 hours (last dose 8/11)   ESBL Klebsiella UTI UC with >100K ESBL klebsiella pneumoniae.  - Continue meropenum  for 7 days (day 6/7)  Viscerocutaneous fistula  Concern for suprapubic fistula, was supposed to follow up with general surgery for this but was admitted. Now concerning for vesiculocutaneous fistula CT on 8/4 without definite fistula.  - urology consult when patient more open to conversation  Chronic sacral decubitus ulcer  Stage IV decubitus ulcer clean and dressed. No signs of acute infection -continue wound care  AKI Creatine now at baseline of 1.01. likely in setting of dehydration -monitor BMP  Rectal cancer status post APR  with colostomy 2018 - Continue ostomy care  Malnutrition [place] placed in ICU. Patient requesting food but also intermittently agitated from ETOH withdrawal.  -SLP consulted -continue tube feeds  and [place]  Prior to Admission Living Arrangement: Home Anticipated Discharge Location: Pending Barriers to Discharge: Clinical improvement Dispo: Anticipated discharge in approximately 2 day(s).   Iona Beard, MD 06/12/2022, 7:10 AM Pager: [phone] After 5pm on weekdays and 1pm on weekends: On Call pager [phone]

## 2022-06-13 DIAGNOSIS — F19980 Other psychoactive substance use, unspecified with psychoactive substance-induced anxiety disorder: Secondary | ICD-10-CM | POA: Diagnosis not present

## 2022-06-13 DIAGNOSIS — N390 Urinary tract infection, site not specified: Secondary | ICD-10-CM | POA: Diagnosis not present

## 2022-06-13 DIAGNOSIS — Z931 Gastrostomy status: Secondary | ICD-10-CM

## 2022-06-13 DIAGNOSIS — F1090 Alcohol use, unspecified, uncomplicated: Secondary | ICD-10-CM | POA: Diagnosis not present

## 2022-06-13 DIAGNOSIS — R7989 Other specified abnormal findings of blood chemistry: Secondary | ICD-10-CM

## 2022-06-13 DIAGNOSIS — G9340 Encephalopathy, unspecified: Secondary | ICD-10-CM | POA: Diagnosis not present

## 2022-06-13 DIAGNOSIS — B961 Klebsiella pneumoniae [K. pneumoniae] as the cause of diseases classified elsewhere: Secondary | ICD-10-CM

## 2022-06-13 LAB — CBC
HCT: 34.6 % — ABNORMAL LOW (ref 36.0–46.0)
Hemoglobin: 11.1 g/dL — ABNORMAL LOW (ref 12.0–15.0)
MCH: 27.5 pg (ref 26.0–34.0)
MCHC: 32.1 g/dL (ref 30.0–36.0)
MCV: 85.9 fL (ref 80.0–100.0)
Platelets: 226 10*3/uL (ref 150–400)
RBC: 4.03 MIL/uL (ref 3.87–5.11)
RDW: 16.3 % — ABNORMAL HIGH (ref 11.5–15.5)
WBC: 3.7 10*3/uL — ABNORMAL LOW (ref 4.0–10.5)
nRBC: 0 % (ref 0.0–0.2)

## 2022-06-13 LAB — BASIC METABOLIC PANEL
Anion gap: 9 (ref 5–15)
BUN: 54 mg/dL — ABNORMAL HIGH (ref 6–20)
CO2: 22 mmol/L (ref 22–32)
Calcium: 9.3 mg/dL (ref 8.9–10.3)
Chloride: 104 mmol/L (ref 98–111)
Creatinine, Ser: 1.04 mg/dL — ABNORMAL HIGH (ref 0.44–1.00)
GFR, Estimated: >60 mL/min (ref >60)
Glucose, Bld: 98 mg/dL (ref 70–99)
Potassium: 4.7 mmol/L (ref 3.5–5.1)
Sodium: 135 mmol/L (ref 135–145)

## 2022-06-13 LAB — GLUCOSE, CAPILLARY
Glucose-Capillary: 98 mg/dL (ref 70–99)
Glucose-Capillary: 100 mg/dL — ABNORMAL HIGH (ref 70–99)
Glucose-Capillary: 95 mg/dL (ref 70–99)
Glucose-Capillary: 94 mg/dL (ref 70–99)
Glucose-Capillary: 118 mg/dL — ABNORMAL HIGH (ref 70–99)

## 2022-06-13 LAB — MAGNESIUM: Magnesium: 2.2 mg/dL (ref 1.7–2.4)

## 2022-06-13 MED ORDER — CHLORDIAZEPOXIDE HCL 25 MG PO CAPS
25.0000 mg | ORAL_CAPSULE | Freq: Once | ORAL | Status: AC
  Administered 2022-06-14: 25 mg via ORAL
  Filled 2022-06-13: qty 1

## 2022-06-13 MED ORDER — CHLORDIAZEPOXIDE HCL 25 MG PO CAPS: 25.0000 mg | ORAL_CAPSULE | Freq: Four times a day (QID) | ORAL | Status: DC | PRN

## 2022-06-13 MED ORDER — PANTOPRAZOLE SODIUM 40 MG PO TBEC
40.0000 mg | DELAYED_RELEASE_TABLET | Freq: Two times a day (BID) | ORAL | Status: DC
  Administered 2022-06-13 – 2022-06-16 (×7): 40 mg via ORAL
  Filled 2022-06-13 (×7): qty 1

## 2022-06-13 NOTE — Progress Notes (Signed)
OT Cancellation Note  Patient Details Name: CELENE PIPPINS MRN: [id number] DOB: 08-Feb-1966   Cancelled Treatment:    Reason Eval/Treat Not Completed: Patient declined, no reason specified (patient refusing OT txc this date, stating " I want something to eat. I am hungry"  Patient educated on importance of getting OOB and participating in therapy this date but patient continuing to refuse and perseverate on getting something to eat)  Hadley Pen 06/13/2022, 2:13 PM

## 2022-06-13 NOTE — Progress Notes (Signed)
Speech Language Pathology Treatment: Dysphagia  Patient Details Name: Sherry Baker MRN: [id number] DOB: [birth year] Today's Date: 06/13/2022 Time: 1015-1030 SLP Time Calculation (min) (ACUTE ONLY): 15 min  Assessment / Plan / Recommendation Clinical Impression  Pt was seen for dysphagia treatment. She was alert and cooperative during the session with improved mentation and clear vocal quality without evidence of wetness/poor secretion management. Pt tolerated purees, dysphagia 3 solids, and thin liquids via cup and straw without s/sx of aspiration even when boluses were large or provided in rapid succession. Pt refused regular textures and stated that she avoids these due to difficulty with mastication. Mastication and oral clearance were WFL. A dysphagia 3 diet with thin liquids is recommended at this time. SLP will continue to follow pt to ensure tolerance.   HPI HPI: Patient is a 56 y/o female who presents on 8/3 with erratic behavior and aggression. 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 Possibly UTI. + cocaine, + BZD. Intubated for airway protection in setting of worsening agitation ETT 8/5-8/7 [facility] not completed. PMH - colostomy, chronic pain, HTN, chronic opiod therapy, polysubstance abuse, Sacral decubitus ulcer, stage IV. Recent admissons 5/3 and 6/3. Pt was seen by SLP 04/14/22 with recommendation for dysphagia 2/thin and subsequent advancement to dysphagia 3/thin.      SLP Plan  Continue with current plan of care      Recommendations for follow up therapy are one component of a multi-disciplinary discharge planning process, led by the attending physician.  Recommendations may be updated based on patient status, additional functional criteria and insurance authorization.    Recommendations  Diet recommendations: Dysphagia 3 (mechanical soft);Thin liquid Liquids provided via:  Cup;Straw Medication Administration: Crushed with puree (or whole as tolerated) Supervision: Patient able to self feed Compensations: Small sips/bites;Slow rate;Minimize environmental distractions Postural Changes and/or Swallow Maneuvers: Seated upright 90 degrees                Oral Care Recommendations: Oral care BID Follow Up Recommendations:  (TBD) Assistance recommended at discharge: Frequent or constant Supervision/Assistance SLP Visit Diagnosis: Dysphagia, unspecified (R13.10) Plan: Continue with current plan of care          Naftoli Penny I. Hardin Negus, [place], [place] Office number [phone]  Horton Marshall  06/13/2022, 10:45 AM

## 2022-06-13 NOTE — Progress Notes (Signed)
HD#8 Subjective:   Summary: Sherry Baker is a 56 year old female with a past medical history of colon cancer status post colostomy, polysubstance/alcohol use disorder, and sacral decubitus ulcer who presented on 8/3 with erratic behavior and was admitted for acute encephalopathy and alcohol/polysubstance withdrawal.  She was transferred to the ICU and intubated on 8/5, extubated and off Precedex drip on 8/7, transferred out of the ICU on 8/10.  Overnight Events: CIWA scores were consistently 9 overnight.  Today patient is laying in bed mittens on and she expresses she is hungry and she wants to eat.  She states she also wants to leave.  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  Objective:  Vital signs in last 24 hours: Vitals:   06/13/22 0402 06/13/22 0748 06/13/22 0802 06/13/22 1203  BP: (!) 146/58 109/71  102/62  Pulse: 77 89  100  Resp: '15 18  19  '$ Temp: 97.8 F (36.6 C) (!) 97.4 F (36.3 C)  97.6 F (36.4 C)  TempSrc: Oral Axillary  Axillary  SpO2: 100% 99% 97% 99%  Weight:      Height:       Supplemental O2: Room Air SpO2: 99 % O2 Flow Rate (L/min): 4 L/min FiO2 (%): 30 %   Physical Exam:  Constitutional: Very thin elderly female who appears older than stated age laying in bed with mittens on and appears agitated and uncomfortable Pulmonary/Chest: normal work of breathing on room air Abdominal: soft, nondistended, bandage over suprapubic fistula, ostomy draining fecal material without obvious blood Neurological: Alert and responsive to her name and basic questions/commands Psych: Agitated  Filed Weights   06/05/22 0706  Weight: 49.2 kg     Intake/Output Summary (Last 24 hours) at 06/13/2022 1422 Last data filed at 06/13/2022 0751 Gross per 24 hour  Intake 1146 ml  Output 1300 ml  Net -154 ml   Net IO Since Admission: 5,414.96 mL [06/13/22 1422]  Pertinent Labs:    Latest Ref  Rng & Units 06/13/2022    7:43 AM 06/12/2022    7:32 AM 06/11/2022    5:56 AM  CBC  WBC 4.0 - 10.5 K/uL 3.7  5.0  4.2   Hemoglobin 12.0 - 15.0 g/dL 11.1  10.1  9.5   Hematocrit 36.0 - 46.0 % 34.6  32.4  30.3   Platelets 150 - 400 K/uL 226  259  282        Latest Ref Rng & Units 06/13/2022    7:43 AM 06/12/2022    7:32 AM 06/11/2022    5:56 AM  CMP  Glucose 70 - 99 mg/dL 98  97  96   BUN 6 - 20 mg/dL 54  40  32   Creatinine 0.44 - 1.00 mg/dL 1.04  1.05  1.01   Sodium 135 - 145 mmol/L 135  135  138   Potassium 3.5 - 5.1 mmol/L 4.7  4.2  3.8   Chloride 98 - 111 mmol/L 104  107  106   CO2 22 - 32 mmol/L '22  19  23   '$ Calcium 8.9 - 10.3 mg/dL 9.3  8.8  8.9   Total Protein 6.5 - 8.1 g/dL   6.4   Total Bilirubin 0.3 - 1.2 mg/dL   0.3   Alkaline Phos 38 - 126 U/L   63   AST 15 - 41 U/L   11   ALT 0 - 44  U/L   9     Imaging: No results found.  Assessment/Plan:   Principal Problem:   Acute encephalopathy Active Problems:   Rectal cancer (HCC)   Polysubstance abuse with associated depression and anxiety   Alcohol use disorder, severe, dependence (HCC)   AKI (acute kidney injury) ([place])   Chronic Sacral wound, subsequent encounter   UTI (urinary tract infection)   High anion gap metabolic acidosis   Cocaine abuse with intoxication ([place])   Fistula   Patient Summary: Sherry Baker is a 56 year old female with a past medical history of colon cancer status post colostomy, polysubstance/alcohol use disorder, and sacral decubitus ulcer who presented on 8/3 with erratic behavior and was admitted for acute encephalopathy and alcohol/polysubstance withdrawal.  She was transferred to the ICU and intubated on 8/5, extubated and off Precedex drip on 8/7, transferred out of the ICU on 8/10.   Acute encephalopathy Polysubstance use disorder Alcohol use disorder with history of DT Similar admission 04/2022. Worsening agitation and withdrawal symptoms and transferred to ICU on 8/5. Intubated and  treated with precedex, phenobarbital. Extubated on 8/7.  Awake but still agitated today.  Appears to still be protecting her airway well and there are no signs of respiratory compromise depression.  Completing Librium and phenobarbital taper today. -Extend librium taper with 25 mg once daily tomorrow morning, librium 25 mg q6 PRN, if requirement more PRN will extend taper further -We will continue to monitor her with CIWA scores and alter management if needed -Seroquel 50 mg twice daily   ESBL Klebsiella UTI UC with >100K ESBL klebsiella pneumoniae.  - Continue meropenum  for 7 days (day 7/7), last dose morning of 8/12   Viscerocutaneous fistula  Concern for suprapubic fistula, was supposed to follow up with general surgery for this but was admitted. Now concerning for vesiculocutaneous fistula CT on 8/4 without definite fistula.  - urology consult when patient more open to conversation   Chronic sacral decubitus ulcer Stage IV decubitus ulcer clean and dressed. No signs of acute infection -continue wound care   AKI Azotemia Admission creatinine of 1.9.  Creatine now at baseline of 1.04, the BUN is precipitously rose and is 54 today.  Hemoglobin is stable at 11.1.  We will continue to monitor for any signs of bleeding as she is at risk for Cushing's ulcer following her ICU stay and intubation.  Consider GI consult if hemoglobin starts to trend down there or other signs of bleeding. - Continue to monitor CBC - Protonix 40 mg twice daily   Rectal cancer status post APR with colostomy 2018 - Continue ostomy care   Malnutrition Cortrack placed in ICU. Patient requesting food but also intermittently agitated from ETOH withdrawal.  Mg 2.2 -SLP consulted, dysphagia 3 diet -continue tube feeds (60 mL an hour) and Juven we will titrate on amount of p.o. intake  Diet:  Dysphagia 3 , tube feeds IVF: None VTE: Enoxaparin Code:  Partial, no CPR PT/OT recs: Pending TOC recs:  Pending    Dispo: Anticipated discharge pending stabilization and ongoing PT/OT/TOC evaluation and recommendations.  [place] Internal [facility] Resident PGY-1 Pager: [phone]  Please contact the on call pager after 5 pm and on weekends at [phone].

## 2022-06-13 NOTE — Progress Notes (Signed)
CSW continuing to follow for needs and medical readiness.   Gilmore Laroche, MSW, [facility]

## 2022-06-13 NOTE — Progress Notes (Signed)
Physical Therapy Treatment Patient Details Name: Sherry Baker MRN: [id number] DOB: 14-Jun-1966 Today's Date: 06/13/2022   History of Present Illness Patient is a 56 y/o female who presents on 8/3 with erratic behavior and aggression. 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 Possibly UTI. + cocaine, + BZD. Intubated 8/5-8/7. Recent admissons 5/3 and 6/3. PMH - colostomy, chronic pain, HTN, chronic opiod therapy, polysubstance abuse, Sacral decubitus ulcer, stage IV.    PT Comments    Pt received in supine, slumped down far into bed, agreeable to therapy session with emphasis on seated balance and repositioning. Pt needing minA for bed mobility, min guard to minA for seated balance and minA +2 for seated lateral scooting. Pt instructed on supine LE exercises for strengthening and will need reinforcement due to cognitive deficit. Pt pleasant and smiling back at therapist at times during session today, following ~50% of simple commands. Pt's speech difficult to understand. Pt continues to benefit from PT services to progress toward functional mobility goals.   Recommendations for follow up therapy are one component of a multi-disciplinary discharge planning process, led by the attending physician.  Recommendations may be updated based on patient status, additional functional criteria and insurance authorization.  Follow Up Recommendations  Skilled nursing-short term rehab (<3 hours/day) Can patient physically be transported by private vehicle: No   Assistance Recommended at Discharge Frequent or constant Supervision/Assistance  Patient can return home with the following Two people to help with walking and/or transfers;A lot of help with bathing/dressing/bathroom;Assistance with cooking/housework;Direct supervision/assist for medications management;Assist for transportation;Help with stairs or ramp for  entrance;Direct supervision/assist for financial management   Equipment Recommendations  Rolling walker (2 wheels)    Recommendations for Other Services       Precautions / Restrictions Precautions Precautions: Fall;Other (comment) Precaution Comments: Contact precs; posey belt, bil mitts, cortrak, sacral wound Restrictions Weight Bearing Restrictions: No     Mobility  Bed Mobility Overal bed mobility: Needs Assistance Bed Mobility: Rolling, Sit to Supine, Sidelying to Sit Rolling: Supervision Sidelying to sit: Min assist, +2 for safety/equipment   Sit to supine: Min assist, +2 for safety/equipment   General bed mobility comments: cues for safety, use of bed features, trunk assist    Transfers Overall transfer level: Needs assistance Equipment used: None               General transfer comment: minA for lateral seated scooting toward [facility] with bed pad assist. RN defer OOB for pt safety given behaviors today.    Ambulation/Gait               General Gait Details: deferred     Balance Overall balance assessment: Needs assistance Sitting-balance support: Bilateral upper extremity supported, Feet unsupported Sitting balance-Leahy Scale: Fair Sitting balance - Comments: fair with BUE support       Standing balance comment: deferred                            Cognition Arousal/Alertness: Awake/alert Behavior During Therapy: Restless Overall Cognitive Status: Difficult to assess                                 General Comments: Mumbling, but intermittently could understand pt. Pt following ~50% of commands today and more calm/cooperative, quick to fatigue.  Exercises Other Exercises Other Exercises: with bed in chair posture: BLE AROM: LAQ, hip flexion x5-10 reps ea Other Exercises: supine BLE AROM: heel slides x10 reps ea    General Comments        Pertinent Vitals/Pain Pain Assessment Pain Assessment:  Faces Faces Pain Scale: Hurts little more Pain Location: bottom (sacral wound site) Pain Descriptors / Indicators: Grimacing, Guarding Pain Intervention(s): Limited activity within patient's tolerance, Monitored during session, Repositioned, Patient requesting pain meds-RN notified    Home Living   Prior Function    PT Goals (current goals can now be found in the care plan section) Acute Rehab PT Goals Patient Stated Goal: to get better PT Goal Formulation: Patient unable to participate in goal setting Time For Goal Achievement: 06/24/22 Progress towards PT goals: Progressing toward goals    Frequency    Min 3X/week      PT Plan Current plan remains appropriate       AM-PAC PT "6 Clicks" Mobility   Outcome Measure  Help needed turning from your back to your side while in a flat bed without using bedrails?: A Little Help needed moving from lying on your back to sitting on the side of a flat bed without using bedrails?: A Lot (mod cues) Help needed moving to and from a bed to a chair (including a wheelchair)?: A Lot (anticipated based on seated scooting) Help needed standing up from a chair using your arms (e.g., wheelchair or bedside chair)?: Total Help needed to walk in hospital room?: Total Help needed climbing 3-5 steps with a railing? : Total 6 Click Score: 10    End of Session Equipment Utilized During Treatment: Gait belt Activity Tolerance: Other (comment);Patient tolerated treatment well (restless/impulsive but more cooperative today) Patient left: in bed;with call bell/phone within reach;with bed alarm set;with restraints reapplied;with nursing/sitter in room (posey bed belt, 1 mitt on, RN entering room to help her set up food) Nurse Communication: Mobility status;Patient requests pain meds PT Visit Diagnosis: Unsteadiness on feet (R26.81);Difficulty in walking, not elsewhere classified (R26.2);Muscle weakness (generalized) (M62.81)     Time: 6222-9798 PT Time  Calculation (min) (ACUTE ONLY): 30 min  Charges:  $Therapeutic Activity: 23-37 mins                     Sherry Baker P., PTA Acute Rehabilitation Services Secure Chat Preferred 9a-5:30pm Office: [place] 06/13/2022, 6:07 PM

## 2022-06-14 DIAGNOSIS — R7989 Other specified abnormal findings of blood chemistry: Secondary | ICD-10-CM | POA: Diagnosis not present

## 2022-06-14 DIAGNOSIS — F1090 Alcohol use, unspecified, uncomplicated: Secondary | ICD-10-CM | POA: Diagnosis not present

## 2022-06-14 DIAGNOSIS — F19980 Other psychoactive substance use, unspecified with psychoactive substance-induced anxiety disorder: Secondary | ICD-10-CM | POA: Diagnosis not present

## 2022-06-14 DIAGNOSIS — G9349 Other encephalopathy: Secondary | ICD-10-CM | POA: Diagnosis not present

## 2022-06-14 LAB — IRON AND TIBC
Iron: 57 ug/dL (ref 28–170)
Saturation Ratios: 19 % (ref 10.4–31.8)
TIBC: 297 ug/dL (ref 250–450)
UIBC: 240 ug/dL

## 2022-06-14 LAB — MAGNESIUM: Magnesium: 2.2 mg/dL (ref 1.7–2.4)

## 2022-06-14 LAB — CBC
HCT: 28.7 % — ABNORMAL LOW (ref 36.0–46.0)
Hemoglobin: 9.1 g/dL — ABNORMAL LOW (ref 12.0–15.0)
MCH: 27.4 pg (ref 26.0–34.0)
MCHC: 31.7 g/dL (ref 30.0–36.0)
MCV: 86.4 fL (ref 80.0–100.0)
Platelets: 266 10*3/uL (ref 150–400)
RBC: 3.32 MIL/uL — ABNORMAL LOW (ref 3.87–5.11)
RDW: 16.3 % — ABNORMAL HIGH (ref 11.5–15.5)
WBC: 3.8 10*3/uL — ABNORMAL LOW (ref 4.0–10.5)
nRBC: 0 % (ref 0.0–0.2)

## 2022-06-14 LAB — BASIC METABOLIC PANEL
Anion gap: 9 (ref 5–15)
BUN: 72 mg/dL — ABNORMAL HIGH (ref 6–20)
CO2: 23 mmol/L (ref 22–32)
Calcium: 9 mg/dL (ref 8.9–10.3)
Chloride: 106 mmol/L (ref 98–111)
Creatinine, Ser: 1.29 mg/dL — ABNORMAL HIGH (ref 0.44–1.00)
GFR, Estimated: 49 mL/min — ABNORMAL LOW (ref >60)
Glucose, Bld: 96 mg/dL (ref 70–99)
Potassium: 4.5 mmol/L (ref 3.5–5.1)
Sodium: 138 mmol/L (ref 135–145)

## 2022-06-14 LAB — TECHNOLOGIST SMEAR REVIEW

## 2022-06-14 LAB — RETICULOCYTES
Immature Retic Fract: 33.2 % — ABNORMAL HIGH (ref 2.3–15.9)
RBC.: 3.36 MIL/uL — ABNORMAL LOW (ref 3.87–5.11)
Retic Count, Absolute: 59.8 10*3/uL (ref 19.0–186.0)
Retic Ct Pct: 1.8 % (ref 0.4–3.1)

## 2022-06-14 LAB — FERRITIN: Ferritin: 131 ng/mL (ref 11–307)

## 2022-06-14 LAB — DIFFERENTIAL
Abs Immature Granulocytes: 0.09 10*3/uL — ABNORMAL HIGH (ref 0.00–0.07)
Basophils Absolute: 0 10*3/uL (ref 0.0–0.1)
Basophils Relative: 0 %
Eosinophils Absolute: 0.2 10*3/uL (ref 0.0–0.5)
Eosinophils Relative: 4 %
Immature Granulocytes: 2 %
Lymphocytes Relative: 34 %
Lymphs Abs: 1.3 10*3/uL (ref 0.7–4.0)
Monocytes Absolute: 0.8 10*3/uL (ref 0.1–1.0)
Monocytes Relative: 20 %
Neutro Abs: 1.5 10*3/uL — ABNORMAL LOW (ref 1.7–7.7)
Neutrophils Relative %: 40 %

## 2022-06-14 LAB — GLUCOSE, CAPILLARY
Glucose-Capillary: 102 mg/dL — ABNORMAL HIGH (ref 70–99)
Glucose-Capillary: 103 mg/dL — ABNORMAL HIGH (ref 70–99)
Glucose-Capillary: 83 mg/dL (ref 70–99)
Glucose-Capillary: 83 mg/dL (ref 70–99)
Glucose-Capillary: 96 mg/dL (ref 70–99)
Glucose-Capillary: 96 mg/dL (ref 70–99)

## 2022-06-14 MED ORDER — BUSPIRONE HCL 5 MG PO TABS
15.0000 mg | ORAL_TABLET | Freq: Two times a day (BID) | ORAL | Status: DC
  Administered 2022-06-14 – 2022-06-16 (×4): 15 mg via ORAL
  Filled 2022-06-14 (×4): qty 3

## 2022-06-14 MED ORDER — ADULT MULTIVITAMIN W/MINERALS CH
1.0000 | ORAL_TABLET | Freq: Every day | ORAL | Status: DC
  Administered 2022-06-15 – 2022-06-16 (×2): 1 via ORAL
  Filled 2022-06-14 (×2): qty 1

## 2022-06-14 MED ORDER — THIAMINE MONONITRATE 100 MG PO TABS
100.0000 mg | ORAL_TABLET | Freq: Every day | ORAL | Status: DC
  Administered 2022-06-15 – 2022-06-16 (×2): 100 mg via ORAL
  Filled 2022-06-14 (×4): qty 1

## 2022-06-14 MED ORDER — CHLORDIAZEPOXIDE HCL 25 MG PO CAPS: 25.0000 mg | ORAL_CAPSULE | Freq: Four times a day (QID) | ORAL | Status: AC | PRN

## 2022-06-14 MED ORDER — CHLORDIAZEPOXIDE HCL 25 MG PO CAPS
25.0000 mg | ORAL_CAPSULE | Freq: Once | ORAL | Status: AC
  Administered 2022-06-15: 25 mg via ORAL
  Filled 2022-06-14: qty 1

## 2022-06-14 MED ORDER — SERTRALINE HCL 50 MG PO TABS
50.0000 mg | ORAL_TABLET | Freq: Every day | ORAL | Status: DC
  Administered 2022-06-14 – 2022-06-16 (×3): 50 mg via ORAL
  Filled 2022-06-14 (×3): qty 1

## 2022-06-14 MED ORDER — QUETIAPINE FUMARATE 50 MG PO TABS
50.0000 mg | ORAL_TABLET | Freq: Two times a day (BID) | ORAL | Status: DC
  Administered 2022-06-14 – 2022-06-16 (×4): 50 mg via ORAL
  Filled 2022-06-14 (×4): qty 1

## 2022-06-14 MED ORDER — ASCORBIC ACID 500 MG PO TABS
250.0000 mg | ORAL_TABLET | Freq: Two times a day (BID) | ORAL | Status: DC
  Administered 2022-06-14 – 2022-06-16 (×4): 250 mg via ORAL
  Filled 2022-06-14 (×4): qty 1

## 2022-06-14 MED ORDER — ZINC SULFATE 220 (50 ZN) MG PO CAPS
220.0000 mg | ORAL_CAPSULE | Freq: Every day | ORAL | Status: DC
  Administered 2022-06-15 – 2022-06-16 (×2): 220 mg via ORAL
  Filled 2022-06-14 (×2): qty 1

## 2022-06-14 MED ORDER — CHLORDIAZEPOXIDE HCL 25 MG PO CAPS: 25.0000 mg | ORAL_CAPSULE | Freq: Four times a day (QID) | ORAL | Status: DC | PRN

## 2022-06-14 NOTE — Progress Notes (Signed)
Dressing change to sacrum completed.

## 2022-06-14 NOTE — Progress Notes (Signed)
HD#9 Subjective:   Summary: Sherry Baker is a 56 year old female with a past medical history of colon cancer status post colostomy, polysubstance/alcohol use disorder, and sacral decubitus ulcer who presented on 8/3 with erratic behavior and was admitted for acute encephalopathy and alcohol/polysubstance withdrawal.  She was transferred to the ICU and intubated on 8/5, extubated and off Precedex drip on 8/7, transferred out of the ICU on 8/10.  Overnight Events: CIWA's were consistently 7 overnight and patient did not require any a prn benzodiazepines.  Today patient is lying in bed and expresses his of depression about her current state and state of her health overall.  States that she has been on Zoloft in the past and remembers that it worked well for feeling similar to these.  She denies any abdominal pain and her ostomy is not currently draining any dark tarry or bloody stool.  Does note that around June of this year she did notice tarry material in her ostomy bag.  Objective:  Vital signs in last 24 hours: Vitals:   06/14/22 0025 06/14/22 0500 06/14/22 0756 06/14/22 0832  BP: 120/62 133/80 (!) 148/83   Pulse: (!) 104 94 99 82  Resp: '20 19 20 16  '$ Temp: 98.1 F (36.7 C) 98.8 F (37.1 C) 97.6 F (36.4 C)   TempSrc: Oral Oral Axillary   SpO2: 98% 100% 98% 100%  Weight:      Height:       Supplemental O2: Room Air SpO2: 100 % O2 Flow Rate (L/min): 4 L/min FiO2 (%): 30 %   Physical Exam:  Constitutional: Very thin elderly female who appears older than stated age laying in bed appears uncomfortable Pulmonary/Chest: normal work of breathing on room air Abdominal: soft, nondistended, bandage over suprapubic fistula, ostomy draining fecal material without obvious blood Neurological: Alert and oriented X3   Filed Weights   06/05/22 0706  Weight: 49.2 kg     Intake/Output Summary (Last 24 hours) at 06/14/2022 0957 Last data filed at 06/14/2022 0500 Gross per 24 hour   Intake 627 ml  Output 1350 ml  Net -723 ml   Net IO Since Admission: 5,531.96 mL [06/14/22 0957]  Pertinent Labs:    Latest Ref Rng & Units 06/14/2022    3:27 AM 06/13/2022    7:43 AM 06/12/2022    7:32 AM  CBC  WBC 4.0 - 10.5 K/uL 3.8  3.7  5.0   Hemoglobin 12.0 - 15.0 g/dL 9.1  11.1  10.1   Hematocrit 36.0 - 46.0 % 28.7  34.6  32.4   Platelets 150 - 400 K/uL 266  226  259        Latest Ref Rng & Units 06/14/2022    3:27 AM 06/13/2022    7:43 AM 06/12/2022    7:32 AM  CMP  Glucose 70 - 99 mg/dL 96  98  97   BUN 6 - 20 mg/dL 72  54  40   Creatinine 0.44 - 1.00 mg/dL 1.29  1.04  1.05   Sodium 135 - 145 mmol/L 138  135  135   Potassium 3.5 - 5.1 mmol/L 4.5  4.7  4.2   Chloride 98 - 111 mmol/L 106  104  107   CO2 22 - 32 mmol/L '23  22  19   '$ Calcium 8.9 - 10.3 mg/dL 9.0  9.3  8.8     Imaging: No results found.  Assessment/Plan:   Principal Problem:   Acute encephalopathy Active Problems:  Rectal cancer ([place])   Polysubstance abuse with associated depression and anxiety   Alcohol use disorder, severe, dependence ([place])   AKI (acute kidney injury) ([place])   Chronic Sacral wound, subsequent encounter   UTI (urinary tract infection)   High anion gap metabolic acidosis   Cocaine abuse with intoxication ([place])   Fistula   Patient Summary: Sherry Baker is a 56 year old female with a past medical history of colon cancer status post colostomy, polysubstance/alcohol use disorder, and sacral decubitus ulcer who presented on 8/3 with erratic behavior and was admitted for acute encephalopathy and alcohol/polysubstance withdrawal.  She was transferred to the ICU and intubated on 8/5, extubated and off Precedex drip on 8/7, transferred out of the ICU on 8/10.     Acute encephalopathy Polysubstance use disorder Alcohol use disorder with history of DT Similar admission 04/2022. Worsening agitation and withdrawal symptoms and transferred to ICU on 8/5. Intubated and treated with  precedex, phenobarbital. Extubated on 8/7.  Awake and tearful today but much more alert than yesterday and able to follow all commands and answer all the questions.  Completed phenobarbital taper yesterday and extended Librium taper to today.  -Extend librium taper another additional day with 25 mg once daily tomorrow morning, librium 25 mg q6 PRN, if requirement more PRN will re-evaluate -We will continue to monitor her with CIWA scores and alter management if needed -Seroquel 50 mg twice daily   ESBL Klebsiella UTI UC with >100K ESBL klebsiella pneumoniae. Completed 7 days of meropenem on 8/11.  No urinary Complaints today.   Viscerocutaneous fistula  Concern for suprapubic fistula, was supposed to follow up with general surgery for this but was admitted. Now concerning for vesiculocutaneous fistula CT on 8/4 without definite fistula.  - urology consult when patient more open to conversation   Chronic sacral decubitus ulcer Stage IV decubitus ulcer clean and dressed. No signs of acute infection -continue wound care   AKI Azotemia Admission creatinine of 1.9 improved with IV fluids to baseline of 1.  BUN has precipitously risen over the past 4 days and is 72 today with a creatinine of 1.29.  Hemoglobin dropped from 11.1-9.1.  No subjective abdominal pain or bloody/tarry stools in her ostomy bag.  We will continue to monitor for any signs of bleeding as she is at risk for Cushing's ulcer following her ICU stay and intubation.  Consider GI consult if hemoglobin starts to trend down there or other signs of bleeding. - Continue to monitor CBC - Protonix 40 mg twice daily  Iron Deficiency Anemia Hemoglobin appears to be around 11 she has been out for the majority of her admission.  She has not had a colonoscopy since 2018 when she was diagnosed with colon cancer.  Hemoglobin dropped to 9.1 today from 11.1 yesterday.  Iron studies show iron of 57 and ferritin of 131 which is likely elevated in the  setting of inflammation.  As above there were no symptoms of acute GI bleed we will continue to monitor.  Consider GI consult if she has more signs of gastrointestinal bleed. -Start ferrous sulfate 325 daily and bowel regimen prn -Continue to monitor CBC - Ending reticulocyte count, tech smear  Depression Patient describes feelings of depression and notes that she has had these feelings before and has seen someone about it outpatient and was prescribed Zoloft with benefit.  Recent abdominal imaging does not show any signs of cirrhosis. -Start Zoloft 50 mg daily   Rectal cancer status post APR with  colostomy 2018 No colonoscopy since diagnosis.  We will consider GI consult with her high risk for GI ulcer as well as recurrent malignancy. - Continue ostomy care   Malnutrition Cortrack placed in ICU. Patient requesting food but also intermittently agitated from ETOH withdrawal.  Mg 2.2 -SLP consulted, dysphagia 3 diet -If eating well this afternoon we will remove her NG tube   Diet:  Dysphagia 3 , tube feeds IVF: None VTE: Enoxaparin Code:  Partial, no CPR PT/OT recs: PT recommends SNF, not work with OT yesterday TOC recs: Pending   Dispo: Anticipated discharge based on further PT/OT work-up and stabilization for acute medical.  [place] Internal Medicine Resident PGY-1 Pager: [phone]  Please contact the on call pager after 5 pm and on weekends at [phone].

## 2022-06-14 NOTE — Progress Notes (Signed)
Patient wonders to renew nonviolent restraint order as patient is intermittently trying to get out of bed but not able due to PT recommendations and her fall risk.  I went to check on the patient and she is still doing well but is at baseline is a fall risk.  She uses a walker at home usually but has not had one recently her friend confirms that she can fall very easily without a walker.  We will keep the nonviolent restraints.

## 2022-06-15 DIAGNOSIS — Z85038 Personal history of other malignant neoplasm of large intestine: Secondary | ICD-10-CM

## 2022-06-15 DIAGNOSIS — D649 Anemia, unspecified: Secondary | ICD-10-CM

## 2022-06-15 LAB — BASIC METABOLIC PANEL
Anion gap: 9 (ref 5–15)
BUN: 79 mg/dL — ABNORMAL HIGH (ref 6–20)
CO2: 25 mmol/L (ref 22–32)
Calcium: 9.2 mg/dL (ref 8.9–10.3)
Chloride: 103 mmol/L (ref 98–111)
Creatinine, Ser: 1.39 mg/dL — ABNORMAL HIGH (ref 0.44–1.00)
GFR, Estimated: 45 mL/min — ABNORMAL LOW (ref >60)
Glucose, Bld: 95 mg/dL (ref 70–99)
Potassium: 4.3 mmol/L (ref 3.5–5.1)
Sodium: 137 mmol/L (ref 135–145)

## 2022-06-15 LAB — CBC WITH DIFFERENTIAL/PLATELET
Abs Immature Granulocytes: 0.1 10*3/uL — ABNORMAL HIGH (ref 0.00–0.07)
Basophils Absolute: 0 10*3/uL (ref 0.0–0.1)
Basophils Relative: 0 %
Eosinophils Absolute: 0.3 10*3/uL (ref 0.0–0.5)
Eosinophils Relative: 9 %
HCT: 27.5 % — ABNORMAL LOW (ref 36.0–46.0)
Hemoglobin: 9 g/dL — ABNORMAL LOW (ref 12.0–15.0)
Lymphocytes Relative: 30 %
Lymphs Abs: 1.1 10*3/uL (ref 0.7–4.0)
MCH: 27.8 pg (ref 26.0–34.0)
MCHC: 32.7 g/dL (ref 30.0–36.0)
MCV: 84.9 fL (ref 80.0–100.0)
Metamyelocytes Relative: 1 %
Monocytes Absolute: 0.5 10*3/uL (ref 0.1–1.0)
Monocytes Relative: 13 %
Myelocytes: 1 %
Neutro Abs: 1.6 10*3/uL — ABNORMAL LOW (ref 1.7–7.7)
Neutrophils Relative %: 46 %
Platelets: 256 10*3/uL (ref 150–400)
RBC: 3.24 MIL/uL — ABNORMAL LOW (ref 3.87–5.11)
RDW: 16.3 % — ABNORMAL HIGH (ref 11.5–15.5)
WBC: 3.5 10*3/uL — ABNORMAL LOW (ref 4.0–10.5)
nRBC: 0 % (ref 0.0–0.2)
nRBC: 0 /100 WBC

## 2022-06-15 LAB — GLUCOSE, CAPILLARY
Glucose-Capillary: 87 mg/dL (ref 70–99)
Glucose-Capillary: 113 mg/dL — ABNORMAL HIGH (ref 70–99)
Glucose-Capillary: 91 mg/dL (ref 70–99)

## 2022-06-15 NOTE — Consult Note (Addendum)
Attending physician's note   I have taken a history, reviewed the chart, and examined the patient. I performed a substantive portion of this encounter, including complete performance of at least one of the key components, in conjunction with the APP. I agree with the APP's note, impression, and recommendations with my edits.   56 year old female with medical history as outlined below to include history of colon cancer diagnosed 2018 s/p APR with end colostomy, polysubstance abuse/EtOH abuse with multiple admissions for encephalopathy and withdrawal, sacral decubitus ulcer, chronic NSAID use, admitted 8/3 with AMS, encephalopathy, withdrawal.  Intubated 8/5, extubated 8/7.  GI service consulted for normocytic anemia without overt GI bleeding.  She denies abdominal pain.  Multiple recent hospitalizations and ER evaluations where Hgb has essentially ranged between 8.2-12. Hgb 11.1 on admission and had been stable between 10-11, with slight downtrend over the last 2 days now 9.0.  Has had uptrending BUN over that same time, but in the setting of uptrending creatinine/AKI.  She is otherwise without overt bleeding.  Discussed the role/utility of upper endoscopy.  She has hemodynamically stable, but certainly acute on chronically ill.  Given lack of overt bleeding and hemodynamic stability, plan for conservative management at this time.  - Observe for e/o GI bleeding - Started on high-dose PPI - Serial CBC checks - If continued decline in hemoglobin or overt bleeding, plan for EGD - She will eventually need outpatient colonoscopy at some point for colon cancer surveillance, but has demonstrated no willingness to follow-up with outpatient care - Continue CIWA protocol - Dr. Tarri Glenn will assume her inpatient GI care starting tomorrow morning  Sherry Heck, DO, [name]                                                     Consultation Note   Referring Provider: [name] PCP: Pcp, [name]: [name] Reason for consultation: anemia  Hospital Day: 11  Assessment / Plan   # 56 yo female with polysubstance abuse, multiple admissions for encephalopathy / Etoh withdrawal. Currently admitted for same.   # History of rectal cancer, s/p resection with end colostomy in 2018. She hasn't had any Gi follow up.  She does need a colonoscopy at some point for colon cancer surveillance.   # Chronic [place] anemia. Hgb fluctuates from 9-11. No overt GI bleeding. Normal iron studies. Her BUN is quite high, out of proportion to creatinine. Again, however her stool is light brown. Could have occult blood loss as she does take Goody powders everyday.  Continue BID PPI Trend H+H  # ESBL Klebsiella UTI UC with >100K ESBL klebsiella pneumoniae.  # Lung nodules under evaluation by Pulmonary.    # History of leaving hospital AMA  # History of Etoh, cirrhosis is in [place]. No cirrhosis reported on CT scan. Albumin likely from malnutrition, INR is normal as are platelets  # See PMH for additional medical problems.   HPI   Sherry Baker is a 56 y.o. female with a past medical history significant for bipolar disorder, polysubstance abuse, CKD3a , chronic diastolic heart failure, colon cancer s/p resection with end colostomy, diverticulosis, sacral ulcer.  See PMH for any additional medical problems.  Sherry Baker was diagnosed with rectal cancer in 2018 by [facility] while inpatient. . She has an  end colostomy. She didn't follow up with [facility]. We saw her in consultation in Dec 2022 for "dried blood on lips". She was hospitalized at the time with encephalopathy, UTI. She had not had follow up colonoscopy since diagnosis of rectal cancer. Plan was for EGD / colonoscopy but it wasn't done. She was too confused to consent. She required precedex. At some point she developed respiratory failure requiring intubation. Since then she has had several more admissions  for altered mental status from substance abuse / Etoh, MRSA, respiratory failure .   Reviewing previous notes, patient was in ED 7/30 with complaints of bleeding form ostomy (? Or maybe stoma). CT scan showed > Postoperative changes of APR with left lower quadrant colostomy., a parastomal hernia containing predominantly fat, no definite cause for bleeding is identified at the stoma. Marland Kitchen EDP spoke with General Surgery who reviewed CT scan and outpatient workup for possible fistula was recommended. Looks from the note that she had an appt with Surgery the following day. She missed the appt. It was rescheduled for 8/4 but appears not to have made that appointment either.   History pertinent to this admission.  Patient presented to ED several days ago with recurrent altered mental status due to polysubstance use / etoh withdrawal.  CT scan unchanged from recent >> Postoperative changes from distal colectomy, large sacral decubitus ulcer unchanged..Unable to protect her airway she required intubation. She was extubated on 8/7.  This admission has been complicated with ESBL klebsiella UTI, AKI.   Prior to intubation she reported living in a "crack house"  and using many different drugs. She has no family in the area.    Sherry Baker says she hasn't seen any blood in ostomy output. She has been having periumbilical pain for a few days. She is managing her ostomy in the hospital and says volume of output is unchanged. She hasn't had any nasuea / vomiting. She takes Goody's everyday because they give her energy.    Recent Labs and Imaging DG Abd Portable 1V  Result Date: 06/09/2022 CLINICAL DATA:  Enteric tube placement EXAM: PORTABLE ABDOMEN - 1 VIEW COMPARISON:  June 07, 2022 FINDINGS: Of note, only the upper abdomen is included in the field of view. The left costophrenic angle is excluded from the field of view. An enteric tube is in place, with the tip projecting over the stomach. The bowel gas pattern is normal.  No radio-opaque calculi or other significant radiographic abnormality are seen. The visualized bibasilar lungs are clear. IMPRESSION: The enteric tube terminates in the stomach. Electronically Signed   By: Beryle Flock M.D.   On: 06/09/2022 13:31   DG Abd 1 View  Result Date: 06/07/2022 CLINICAL DATA:  [id number] EXAM: ABDOMEN - 1 VIEW COMPARISON:  April 09, 2022 FINDINGS: Enteric tube tip and side port project over the distal stomach. Visualized lung bases are unremarkable. Incomplete assessment of the pelvis. Gaseous distension of the stomach. IMPRESSION: Enteric tube tip and side port project over the distal stomach. Electronically Signed   By: Valentino Saxon M.D.   On: 06/07/2022 13:07   DG CHEST PORT 1 VIEW  Result Date: 06/07/2022 CLINICAL DATA:  [id number]; ETT placement EXAM: PORTABLE CHEST 1 VIEW COMPARISON:  Radiographs dated June 05, 2022 FINDINGS: The cardiomediastinal silhouette is unchanged in contour.Incomplete assessment of the RIGHT lateral soft tissues. ETT tip terminates 3.7 cm above the carina. The enteric tube courses through the chest to the abdomen beyond the field-of-view. Hazy opacity of  the LEFT mid lung, nonspecific and likely atelectasis or aspiration. No pneumothorax. No acute pleuroparenchymal abnormality. Gaseous distension of the stomach. IMPRESSION: Support apparatus as described above. Electronically Signed   By: Valentino Saxon M.D.   On: 06/07/2022 13:06   CT ABDOMEN PELVIS W CONTRAST  Result Date: 06/06/2022 CLINICAL DATA:  Abdominal pain, acute, nonlocalized There is an unknown fistula in the suprapubic region anteriorly that is draining clear liquid appears to be urine. No recorded history of indwelling suprapubic catheters known EXAM: CT ABDOMEN AND PELVIS WITH CONTRAST TECHNIQUE: Multidetector CT imaging of the abdomen and pelvis was performed using the standard protocol following bolus administration of intravenous contrast. RADIATION DOSE REDUCTION:  This exam was performed according to the departmental dose-optimization program which includes automated exposure control, adjustment of the mA and/or kV according to patient size and/or use of iterative reconstruction technique. CONTRAST:  40m OMNIPAQUE IOHEXOL 300 MG/ML  SOLN COMPARISON:  06/01/2022 FINDINGS: Lower chest: Not visualized Hepatobiliary: Liver is not imaged in its entirety. No visible focal hepatic abnormality in the areas imaged. Gallbladder unremarkable. Pancreas: No focal abnormality or ductal dilatation. Spleen: Spleen not imaged in its entirety. No focal abnormality in the portions imaged. Adrenals/Urinary Tract: Right kidney is atrophic. No stones or hydronephrosis. No renal or adrenal mass. Mild wall thickening within the urinary bladder. No definite visible fistula seen extending from the bladder to the anterior abdominal wall. Stomach/Bowel: Left lower quadrant ostomy noted. Parastomal hernia contains predominantly fat. Findings are stable since prior study. No bowel obstruction. Prior distal colectomy. Vascular/Lymphatic: Aortic atherosclerosis. No evidence of aneurysm or adenopathy. Reproductive: No visible focal abnormality. Other: Presacral soft tissue thickening again noted, asymmetric to the left, unchanged since recent study. No free fluid or free air. Musculoskeletal: Large sacral decubitus ulcer noted. This is unchanged since prior study. No evidence for osteomyelitis. Advanced degenerative changes within the hips bilaterally. IMPRESSION: Postoperative changes from distal colectomy. Left lower quadrant ostomy again noted, unchanged. Stable parastomal hernia containing fat. Aortic atherosclerosis. Mild gallbladder wall thickening. This could reflect cystitis. Recommend clinical correlation. Stable presacral soft tissue thickening favored represent postoperative changes. Recommend attention on follow-up imaging or PET CT. Large sacral decubitus ulcer unchanged. No evidence of  osteomyelitis. Electronically Signed   By: KRolm BaptiseM.D.   On: 06/06/2022 20:23   [place]RENAL  Result Date: 06/05/2022 CLINICAL DATA:  Acute kidney injury EXAM: RENAL / URINARY TRACT ULTRASOUND COMPLETE COMPARISON:  None Available. FINDINGS: Right Kidney: Not visualized. Left Kidney: Renal measurements: 9.9 x 5.1 x 4.6 cm = volume: 122 mL. Echogenicity within normal limits. No mass visualized. Mild hydronephrosis. Bladder: Appears normal for degree of bladder distention. Other: None. IMPRESSION: Mild left hydronephrosis.  Right kidney not visualized. Electronically Signed   By: LYetta GlassmanM.D.   On: 06/05/2022 10:07   CT HEAD WO CONTRAST (5MM)  Result Date: 06/05/2022 CLINICAL DATA:  Mental status change, unknown cause EXAM: CT HEAD WITHOUT CONTRAST TECHNIQUE: Contiguous axial images were obtained from the base of the skull through the vertex without intravenous contrast. RADIATION DOSE REDUCTION: This exam was performed according to the departmental dose-optimization program which includes automated exposure control, adjustment of the mA and/or kV according to patient size and/or use of iterative reconstruction technique. COMPARISON:  CT head April 24, 2022. FINDINGS: Brain: No evidence of acute infarction, hemorrhage, hydrocephalus, extra-axial collection or mass lesion/mass effect. Similar chronic microvascular ischemic disease and atrophy. Vascular: No hyperdense vessel identified. Skull: No acute fracture. Sinuses/Orbits: Mild paranasal sinus mucosal  thickening. No acute orbital findings. Other: No mastoid effusions.  Six IMPRESSION: No evidence of acute intracranial abnormality. Electronically Signed   By: Margaretha Sheffield M.D.   On: 06/05/2022 08:09   DG Chest Port 1 View  Result Date: 06/05/2022 CLINICAL DATA:  Provided history: Altered mental status. EXAM: PORTABLE CHEST 1 VIEW COMPARISON:  Prior chest radiographs 04/24/2022 and earlier. FINDINGS: Heart size within normal limits. No  appreciable airspace consolidation or pulmonary edema. No evidence of pleural effusion or pneumothorax. No acute bony abnormality identified. IMPRESSION: No evidence of acute cardiopulmonary abnormality. Electronically Signed   By: Kellie Simmering D.O.   On: 06/05/2022 08:07   CT ABDOMEN PELVIS W CONTRAST  Result Date: 06/01/2022 CLINICAL DATA:  56 year old female with history of rectal cancer status post APR. Bleeding around the stoma site. * Tracking Code: BO * EXAM: CT ABDOMEN AND PELVIS WITH CONTRAST TECHNIQUE: Multidetector CT imaging of the abdomen and pelvis was performed using the standard protocol following bolus administration of intravenous contrast. RADIATION DOSE REDUCTION: This exam was performed according to the departmental dose-optimization program which includes automated exposure control, adjustment of the mA and/or kV according to patient size and/or use of iterative reconstruction technique. CONTRAST:  81m OMNIPAQUE IOHEXOL 300 MG/ML  SOLN COMPARISON:  CT of the abdomen and pelvis 05/21/2022. FINDINGS: Lower chest: Fibrotic changes in the lung bases bilaterally. Atherosclerotic calcifications in the descending thoracic aorta as well as the left circumflex and right coronary arteries. Small hiatal hernia. Hepatobiliary: No suspicious cystic or solid hepatic lesions. No intra or extrahepatic biliary ductal dilatation. Gallbladder is normal in appearance. Pancreas: No pancreatic mass. No pancreatic ductal dilatation. No pancreatic or peripancreatic fluid collections or inflammatory changes. Spleen: Unremarkable. Adrenals/Urinary Tract: Severe atrophy of the right kidney. Mild multifocal cortical thinning in the left kidney. No suspicious renal lesions. No hydroureteronephrosis. Urinary bladder is grossly unremarkable in appearance. Bilateral adrenal glands are normal in appearance. Stomach/Bowel: The appearance of the stomach is normal. No pathologic dilatation of small bowel or colon.  Postoperative changes of abdominal perineal resection are noted with left lower quadrant colostomy. Small parastomal hernia containing predominantly omental fat. There continues to be some thickening of the presacral soft tissues (left-greater-than-right), somewhat mass-like in appearance measuring up to 6.3 x 2.3 cm (axial image 68 of series 3), which may simply reflect chronic postoperative scarring, however, residual neoplasm in this region is difficult to entirely exclude. Vascular/Lymphatic: Extensive atherosclerosis of the abdominal aorta and pelvic vasculature, without evidence of aneurysm or dissection in the abdomen or pelvis. No definite lymphadenopathy noted in the abdomen or pelvis. Reproductive: Uterus and ovaries are atrophic. Other: No significant volume of ascites.  No pneumoperitoneum. Musculoskeletal: Irregular soft tissues thickening superficial to the lower aspect of the sacrum where there is gas extending into the soft tissues, and what appears to be a soft tissue defect extending into the medial aspects of the gluteal clefts bilaterally. Findings are suggestive of soft tissue ulceration from decubitus ulcer, but are poorly evaluated on CT. No destructive bony changes are noted in the sacrum or coccyx at this time. There are no aggressive appearing lytic or blastic lesions noted in the visualized portions of the skeleton. Advanced degenerative changes of osteoarthritis are noted in the hip joints bilaterally. IMPRESSION: 1. Postoperative changes of APR with left lower quadrant colostomy. There is a parastomal hernia containing predominantly fat. No definite cause for bleeding is identified at the stoma. 2. There is what appears to be a large decubitus ulcer, as  discussed above. No definite bony destruction in the sacrum or coccyx at this time to clearly indicate associated osteomyelitis. 3. Chronic soft tissue thickening in the presacral space (left-greater-than-right). This appears slightly  more prominent when compared to prior examinations, favored to reflect areas of chronic postoperative scarring, however, residual or recurrent neoplasm in this region is difficult to entirely exclude, and close attention on follow-up studies is recommended to ensure stability. If clinically appropriate, follow-up PET-CT could be considered. 4. Aortic atherosclerosis, in addition to at least 2 vessel coronary artery disease. Please note that although the presence of coronary artery calcium documents the presence of coronary artery disease, the severity of this disease and any potential stenosis cannot be assessed on this non-gated CT examination. Assessment for potential risk factor modification, dietary therapy or pharmacologic therapy may be warranted, if clinically indicated. 5. Small hiatal hernia. 6. Fibrotic changes in the lung bases bilaterally. Outpatient referral to Pulmonology for further clinical evaluation is suggested. Follow-up nonemergent high-resolution chest CT should also be considered to better characterize these findings. Electronically Signed   By: Vinnie Langton M.D.   On: 06/01/2022 10:41   CT Abdomen Pelvis W Contrast  Result Date: 05/21/2022 CLINICAL DATA:  Abdominal pain. EXAM: CT ABDOMEN AND PELVIS WITH CONTRAST TECHNIQUE: Multidetector CT imaging of the abdomen and pelvis was performed using the standard protocol following bolus administration of intravenous contrast. RADIATION DOSE REDUCTION: This exam was performed according to the departmental dose-optimization program which includes automated exposure control, adjustment of the mA and/or kV according to patient size and/or use of iterative reconstruction technique. CONTRAST:  58m OMNIPAQUE IOHEXOL 300 MG/ML  SOLN COMPARISON:  Mar 06, 2022 FINDINGS: Lower chest: Chronic interstitial lung changes in the lung bases. Hepatobiliary: No focal liver abnormality is seen. No gallstones, gallbladder wall thickening, or biliary dilatation.  Pancreas: Unremarkable. No pancreatic ductal dilatation or surrounding inflammatory changes. Spleen: Normal in size without focal abnormality. Adrenals/Urinary Tract: Normal adrenal glands. Normal left kidney. Atrophic right kidney. No hydronephrosis. Normal urinary bladder. Stomach/Bowel: Status post left colectomy with ostomy in the left lower quadrant of the abdomen. No evidence of incarceration peristomal hernia or significant inflammatory changes associated with the stoma. No evidence of small-bowel obstruction. Normal stomach. Vascular/Lymphatic: Aortic atherosclerosis. No enlarged abdominal or pelvic lymph nodes. Reproductive: Uterus and bilateral adnexa are unremarkable. Other: Nonspecific soft tissue thickening in the presacral area. Thin gas collection inferior to the sacrum with surrounding soft tissue thickening. Musculoskeletal: Marked sclerotic and cystic dystrophic changes at the bilateral hip joints involving the acetabula in the humeral heads IMPRESSION: 1. Status post left colectomy with ostomy in the left lower quadrant of the abdomen. No evidence of incarceration peristomal hernia or significant inflammatory changes associated with the stoma. 2. Nonspecific soft tissue thickening in the presacral area. 3. Thin gas collection inferior to the sacrum with surrounding soft tissue thickening. Please correlate to physical exam to assess for possible decubital ulcer. 4. Marked sclerotic and cystic dystrophic changes at the bilateral hip joints involving the acetabula and the humeral heads. Aortic Atherosclerosis (ICD10-I70.0). Electronically Signed   By: DFidela SalisburyM.D.   On: 05/21/2022 19:31    Labs:  Recent Labs    06/13/22 0743 06/14/22 0327 06/15/22 0335  WBC 3.7* 3.8* 3.5*  HGB 11.1* 9.1* 9.0*  HCT 34.6* 28.7* 27.5*  PLT 226 266 256   Recent Labs    06/13/22 0743 06/14/22 0327 06/15/22 0335  NA 135 138 137  K 4.7 4.5 4.3  CL 104  106 103  CO2 '22 23 25  ' GLUCOSE 98 96 95   BUN 54* 72* 79*  CREATININE 1.04* 1.29* 1.39*  CALCIUM 9.3 9.0 9.2   No results for input(s): "PROT", "ALBUMIN", "AST", "ALT", "ALKPHOS", "BILITOT", "BILIDIR", "IBILI" in the last 72 hours. No results for input(s): "HEPBSAG", "HCVAB", "HEPAIGM", "HEPBIGM" in the last 72 hours. No results for input(s): "LABPROT", "INR" in the last 72 hours.  Past Medical History:  Diagnosis Date   Acquired hypothyroidism    Alcohol abuse    Allergy    PCNS swelling   Arthritis    Bipolar 1 disorder ([place])    Cancer ([place]) 01/21/2017   rectal cancer   Cancer ([place])    Chronic kidney disease    Chronic pain syndrome    Cirrhosis ([place])    Cirrhosis of liver ([place])    Cocaine abuse ([place])    COVID-19 virus infection 06/23/2021   Delirium tremens ([place]) 06/22/2021   Depression    Genetic testing 03/24/2017   Ms. Darwin underwent genetic counseling and testing for hereditary cancer syndromes on 02/17/2017. Her results were negative for mutations in all 46 genes analyzed by [facility]'s 46-gene Common Hereditary Cancers Panel. Genes analyzed include: APC, ATM, AXIN2, BARD1, BMPR1A, BRCA1, BRCA2, BRIP1, CDH1, CDKN2A, CHEK2, CTNNA1, DICER1, EPCAM, GREM1, HOXB13, KIT, MEN1, MLH1, MSH2, MSH3, MSH6, MUTYH, NBN,   HTN (hypertension)    Hypertension    Polysubstance abuse ([place])    Rectal cancer ([place])    Suicidal ideation 02/11/2014    Past Surgical History:  Procedure Laterality Date   ABDOMINAL PERINEAL BOWEL RESECTION N/A 06/18/2017   Procedure: ABDOMINAL PERINEAL RESECTION ERAS PATHWAY;  Surgeon: Leighton Ruff, MD;  Location: [facility];  Service: General;  Laterality: N/A;   BUBBLE STUDY  11/29/2021   Procedure: BUBBLE STUDY;  Surgeon: Berniece Salines, DO;  Location: [facility];  Service: Cardiovascular;;   COLON SURGERY     COLONOSCOPY WITH PROPOFOL Left 01/21/2017   Procedure: COLONOSCOPY WITH PROPOFOL;  Surgeon: Otis Brace, MD;  Location: [facility];  Service: Gastroenterology;  Laterality: Left;    Colostomy     FRACTURE SURGERY     MANDIBLE FRACTURE SURGERY     TEE WITHOUT CARDIOVERSION N/A 11/29/2021   Procedure: TRANSESOPHAGEAL ECHOCARDIOGRAM (TEE);  Surgeon: Berniece Salines, DO;  Location: [facility];  Service: Cardiovascular;  Laterality: N/A;    Family History  Problem Relation Age of Onset   Cancer Mother 79       Originating in the abdomen, otherwise unknwon   Cancer - Other Maternal Aunt        Throat    Prior to Admission medications   Medication Sig Start Date End Date Taking? Authorizing Provider  acetaminophen (TYLENOL) 325 MG tablet Take 2 tablets (650 mg total) by mouth every 12 (twelve) hours. 12/13/21   Samella Parr, NP  albuterol (VENTOLIN HFA) 108 (90 Base) MCG/ACT inhaler Inhale 2 puffs into the lungs every 6 (six) hours as needed for wheezing or shortness of breath. 04/17/22   Shelly Coss, MD  Benzocaine (HURRCAINE) 20 % AERO Use as directed 1 Application in the mouth or throat 3 (three) times daily as needed for mouth pain. 04/17/22   Shelly Coss, MD  busPIRone (BUSPAR) 15 MG tablet Take 1 tablet (15 mg total) by mouth 2 (two) times daily. 04/17/22   Shelly Coss, MD  carvedilol (COREG) 6.25 MG tablet Take 1 tablet (6.25 mg total) by mouth 2 (two) times daily with a meal. 04/17/22  Shelly Coss, MD  feeding supplement (ENSURE ENLIVE / ENSURE PLUS) LIQD Take 237 mLs by mouth 3 (three) times daily between meals. Patient not taking: Reported on 05/28/2022 12/13/21   Samella Parr, NP  folic acid (FOLVITE) 1 MG tablet Take 1 tablet (1 mg total) by mouth daily. 04/17/22   Shelly Coss, MD  magic mouthwash (nystatin, lidocaine, diphenhydrAMINE, alum & mag hydroxide) suspension Swish and spit 5 mLs 4 (four) times daily as needed for mouth pain. 04/22/22   White, Leitha Schuller, NP  potassium chloride (KLOR-CON) 10 MEQ tablet Take 1 tablet (10 mEq total) by mouth daily for 20 doses. 04/24/22 08/09/22  Wyvonnia Dusky, MD  thiamine 100 MG tablet Take 1 tablet  (100 mg total) by mouth daily. Patient not taking: Reported on 05/28/2022 04/17/22   Shelly Coss, MD  umeclidinium-vilanterol [facility] ELLIPTA) 62.5-25 MCG/ACT AEPB Inhale 1 puff into the lungs daily. 04/17/22   Shelly Coss, MD  ziprasidone (GEODON) 40 MG capsule Take 1 capsule (40 mg total) by mouth 2 (two) times daily with a meal. 11/06/11 01/09/12  Daleen Bo, MD    Current Facility-Administered Medications  Medication Dose Route Frequency Provider Last Rate Last Admin   0.9 %  sodium chloride infusion   Intravenous PRN Juanito Doom, MD   Stopped at 06/10/22 1015   albuterol (PROVENTIL) (2.5 MG/3ML) 0.083% nebulizer solution 2.5 mg  2.5 mg Nebulization Q4H PRN Juanito Doom, MD       ascorbic acid (VITAMIN C) tablet 250 mg  250 mg Oral BID Johny Blamer, DO   250 mg at 06/15/22 1005   busPIRone (BUSPAR) tablet 15 mg  15 mg Oral BID Johny Blamer, DO   15 mg at 06/15/22 1003   chlordiazePOXIDE (LIBRIUM) capsule 25 mg  25 mg Oral Q6H PRN Johny Blamer, DO       Chlorhexidine Gluconate Cloth 2 % PADS 6 each  6 each Topical Daily Simonne Maffucci B, MD   6 each at 06/15/22 1100   enoxaparin (LOVENOX) injection 40 mg  40 mg Subcutaneous Q24H Simonne Maffucci B, MD   40 mg at 06/15/22 1005   multivitamin with minerals tablet 1 tablet  1 tablet Oral Daily Johny Blamer, DO   1 tablet at 06/15/22 1005   pantoprazole (PROTONIX) EC tablet 40 mg  40 mg Oral BID Johny Blamer, DO   40 mg at 06/15/22 1005   QUEtiapine (SEROQUEL) tablet 50 mg  50 mg Oral BID Johny Blamer, DO   50 mg at 06/15/22 1005   sertraline (ZOLOFT) tablet 50 mg  50 mg Oral Daily Johny Blamer, DO   50 mg at 06/15/22 1005   thiamine (VITAMIN B1) tablet 100 mg  100 mg Oral Daily Lottie Mussel, MD   100 mg at 06/15/22 1000   umeclidinium-vilanterol (ANORO ELLIPTA) 62.5-25 MCG/ACT 1 puff  1 puff Inhalation Daily Simonne Maffucci B, MD   1 puff at 06/15/22 0802   zinc sulfate capsule 220 mg  220 mg Oral Daily  Johny Blamer, DO   220 mg at 06/15/22 1005    Allergies as of 06/05/2022 - Review Complete 06/05/2022  Allergen Reaction Noted   Penicillins Hives and Swelling 06/06/2019    Social History   Socioeconomic History   Marital status: Unknown    Spouse name: Not on file   Number of children: Not on file   Years of education: Not on file   Highest education level: Not on file  Occupational History  Not on file  Tobacco Use   Smoking status: Every Day    Packs/day: 1.00    Years: 44.00    Total pack years: 44.00    Types: Cigarettes    Start date: 11/04/1976   Smokeless tobacco: Never   Tobacco comments:    6-7 cigarettes a day  Vaping Use   Vaping Use: Never used  Substance and Sexual Activity   Alcohol use: Yes    Comment: uknown   Drug use: Yes    Types: Cocaine, "Crack" cocaine    Comment: 07/28/2017 "nothing in 3 wks"   Sexual activity: Not Currently    Birth control/protection: Abstinence  Other Topics Concern   Not on file  Social History Narrative   ** Merged History Encounter **       ** Merged History Encounter **       ** Merged History Encounter **       Social Determinants of Radio broadcast assistant Strain: Not on file  Food Insecurity: Not on file  Transportation Needs: Not on file  Physical Activity: Not on file  Stress: Not on file  Social Connections: Not on file  Intimate Partner Violence: Not on file    Review of Systems: All systems reviewed and negative except where noted in HPI.  Physical Exam: Vital signs in last 24 hours: Temp:  [97.6 F (36.4 C)-98.6 F (37 C)] 97.6 F (36.4 C) (08/13 1154) Pulse Rate:  [91-101] 93 (08/13 1154) Resp:  [14-20] 17 (08/13 1154) BP: (108-144)/(63-72) 127/64 (08/13 1154) SpO2:  [97 %-99 %] 99 % (08/13 1154) Last BM Date : 06/15/22  General:  Alert chronically ill appearing female in NAD Psych:  Pleasant, cooperative. Normal mood and affect Eyes: Pupils equal, no icterus. Conjunctive  pink Ears:  Normal auditory acuity Nose: No deformity, discharge or lesions Neck:  Supple, no masses felt Lungs:  Clear to auscultation.  Heart:  Regular rate, regular rhythm. No lower extremity edema Abdomen:  Soft, nondistended, nontender, active bowel sounds, no masses felt Rectal :  Deferred Msk: Symmetrical without gross deformities.  Neurologic:  Alert, oriented, grossly normal neurologically Skin:  Intact without significant lesions.    Intake/Output from previous day: 08/12 0701 - 08/13 0700 In: 1040 [P.O.:1040] Out: 2550 [Urine:2100; Stool:450] Intake/Output this shift:  Total I/O In: -  Out: 950 [Urine:950]    Principal Problem:   Acute encephalopathy Active Problems:   Rectal cancer ([place])   Polysubstance abuse with associated depression and anxiety   Alcohol use disorder, severe, dependence (HCC)   AKI (acute kidney injury) ([place])   Chronic Sacral wound, subsequent encounter   UTI (urinary tract infection)   High anion gap metabolic acidosis   Cocaine abuse with intoxication ([place])   Fistula    Tye Savoy, NP-C @  06/15/2022, 1:09 PM

## 2022-06-15 NOTE — Progress Notes (Addendum)
HD#10 Subjective:   Summary: Sherry Baker is a 56 year old female with a past medical history of colon cancer status post colostomy, polysubstance/alcohol use disorder, and sacral decubitus ulcer who presented on 8/3 with erratic behavior and was admitted for acute encephalopathy and alcohol/polysubstance withdrawal.  She was transferred to the ICU and intubated on 8/5, extubated and off Precedex drip on 8/7, transferred out of the ICU on 8/10.  Overnight Events: CIWA overnight  were 0-2 and she did not require any PRNs.   Today she is doing well with the NG tube out and denies any abdominal pain as well as any other new or worsening symptoms.  She is eating well and continuing to feel better.  Objective:  Vital signs in last 24 hours: Vitals:   06/15/22 0604 06/15/22 0744 06/15/22 0802 06/15/22 1154  BP: (!) 144/64 133/63  127/64  Pulse: 93 100  93  Resp: '19 14  17  '$ Temp:  97.6 F (36.4 C)  97.6 F (36.4 C)  TempSrc:  Axillary  Oral  SpO2:  98% 97% 99%  Weight:      Height:       Supplemental O2: Room Air SpO2: 99 % O2 Flow Rate (L/min): 4 L/min FiO2 (%): 30 %   Physical Exam:  Constitutional: Very thin elderly female who appears older than stated age laying in bed, in no acute distress Pulmonary/Chest: normal work of breathing on room air Abdominal: soft, nondistended, non-tender. ostomy draining fecal material without obvious blood. Suprapubic fistula stable without surrounding edema or erythema and there is nothing draining out of it Neurological: Alert and oriented X3  Filed Weights   06/05/22 0706  Weight: 49.2 kg     Intake/Output Summary (Last 24 hours) at 06/15/2022 1250 Last data filed at 06/15/2022 1008 Gross per 24 hour  Intake 560 ml  Output 2850 ml  Net -2290 ml   Net IO Since Admission: 3,071.96 mL [06/15/22 1250]  Pertinent Labs:    Latest Ref Rng & Units 06/15/2022    3:35 AM 06/14/2022    3:27 AM 06/13/2022    7:43 AM  CBC  WBC 4.0 - 10.5  K/uL 3.5  3.8  3.7   Hemoglobin 12.0 - 15.0 g/dL 9.0  9.1  11.1   Hematocrit 36.0 - 46.0 % 27.5  28.7  34.6   Platelets 150 - 400 K/uL 256  266  226        Latest Ref Rng & Units 06/15/2022    3:35 AM 06/14/2022    3:27 AM 06/13/2022    7:43 AM  CMP  Glucose 70 - 99 mg/dL 95  96  98   BUN 6 - 20 mg/dL 79  72  54   Creatinine 0.44 - 1.00 mg/dL 1.39  1.29  1.04   Sodium 135 - 145 mmol/L 137  138  135   Potassium 3.5 - 5.1 mmol/L 4.3  4.5  4.7   Chloride 98 - 111 mmol/L 103  106  104   CO2 22 - 32 mmol/L '25  23  22   '$ Calcium 8.9 - 10.3 mg/dL 9.2  9.0  9.3     Imaging: No results found.  Assessment/Plan:   Principal Problem:   Acute encephalopathy Active Problems:   Rectal cancer (HCC)   Polysubstance abuse with associated depression and anxiety   Alcohol use disorder, severe, dependence (HCC)   AKI (acute kidney injury) ([place])   Chronic Sacral wound, subsequent encounter   UTI (  urinary tract infection)   High anion gap metabolic acidosis   Cocaine abuse with intoxication ([place])   Fistula   Patient Summary: Sherry Baker is a 56 year old female with a past medical history of colon cancer status post colostomy, polysubstance/alcohol use disorder, and sacral decubitus ulcer who presented on 8/3 with erratic behavior and was admitted for acute encephalopathy and alcohol/polysubstance withdrawal.  She was transferred to the ICU and intubated on 8/5, extubated and off Precedex drip on 8/7, transferred out of the ICU on 8/10.     Acute encephalopathy Polysubstance use disorder Alcohol use disorder with history of DT Similar admission 04/2022. Worsening agitation and withdrawal symptoms and transferred to ICU on 8/5. Intubated and treated with precedex, phenobarbital. Extubated on 8/7.  Patient continues to show improvement in mentation after speaking to her friend she appears to be at baseline.  She has not required any prn benzos she was have continued to fall and were 0 x2 last  night. -Extend librium taper another additional day with 25 mg once this morning -We will continue to monitor her with CIWA scores and alter management if needed -Seroquel 50 mg twice daily  Azotemia BUN has precipitously risen over the past 4-5 days and is 76 today with a creatinine of 1.39.  Hemoglobin dropped from 11.1-9.  No subjective abdominal pain or bloody/tarry stools in her ostomy bag.  We will continue to monitor for any signs of bleeding as she is at risk for Cushing's ulcer following her ICU stay and intubation.  GI consulted and will see the patient. - Continue to monitor CBC - Protonix 40 mg twice daily  ESBL Klebsiella UTI UC with >100K ESBL klebsiella pneumoniae. Completed 7 days of meropenem on 8/11. Remove foley today.   Viscerocutaneous fistula  Concern for suprapubic fistula, was supposed to follow up with general surgery for this but was admitted. Now concerning for vesiculocutaneous fistula CT on 8/4 without definite fistula.  No current drainage or signs of irritation or infection around the fistula. - discuss urology outpatient follow up   Chronic sacral decubitus ulcer Stage IV decubitus ulcer clean and dressed. No signs of acute infection -continue wound care   AKI Admission creatinine of 1.9 improved with IV fluids to baseline of 1.     Iron Deficiency Anemia Hemoglobin appears to be around 11 she has been out for the majority of her admission.  She has not had a colonoscopy since 2018 when she was diagnosed with colon cancer.  Hemoglobin dropped to 9.1 today from 11.1 yesterday.  Iron studies show iron of 57 and ferritin of 131 which is likely elevated in the setting of inflammation.   -Continue ferrous sulfate 325 daily and bowel regimen prn -Continue to monitor CBC -GI consult as above, concern for recurrent malignancy   Depression Patient describes feelings of depression and notes that she has had these feelings before and has seen someone about it  outpatient and was prescribed Zoloft with benefit. Recent abdominal imaging does not show any signs of cirrhosis. -Continue Zoloft 50 mg daily   Rectal cancer status post APR with colostomy 2018 No colonoscopy since diagnosis. High risk for GI ulcer as well as recurrent malignancy. - Continue ostomy care - GI consult as above   Malnutrition Cortrack placed in ICU. Patient requesting food but also intermittently agitated from ETOH withdrawal.  Mg 2.2.  NG tube was removed on 8/12.  Patient eating well. -SLP consulted, dysphagia 3 diet -NG tube removed  Goals of  Care Made a partial code during this admission but as she is mentating better and she has previously we will have further discussions about goals of care with her.   Diet:  Dysphagia 3 IVF: None VTE: Enoxaparin Code:  Partial, no CPR PT/OT recs: PT recommends SNF, did not work with OT yesterday TOC recs: Pending     Dispo: Anticipated discharge based on further PT/OT work-up and stabilization for acute medical.  [facility] Resident PGY-1 Pager: [phone]  Please contact the on call pager after 5 pm and on weekends at [phone].

## 2022-06-16 DIAGNOSIS — G9349 Other encephalopathy: Secondary | ICD-10-CM | POA: Diagnosis not present

## 2022-06-16 DIAGNOSIS — F19980 Other psychoactive substance use, unspecified with psychoactive substance-induced anxiety disorder: Secondary | ICD-10-CM | POA: Diagnosis not present

## 2022-06-16 DIAGNOSIS — D649 Anemia, unspecified: Secondary | ICD-10-CM

## 2022-06-16 DIAGNOSIS — D500 Iron deficiency anemia secondary to blood loss (chronic): Secondary | ICD-10-CM

## 2022-06-16 DIAGNOSIS — F14129 Cocaine abuse with intoxication, unspecified: Secondary | ICD-10-CM | POA: Diagnosis not present

## 2022-06-16 DIAGNOSIS — F32A Depression, unspecified: Secondary | ICD-10-CM

## 2022-06-16 DIAGNOSIS — F1090 Alcohol use, unspecified, uncomplicated: Secondary | ICD-10-CM | POA: Diagnosis not present

## 2022-06-16 LAB — BASIC METABOLIC PANEL
Anion gap: 10 (ref 5–15)
BUN: 62 mg/dL — ABNORMAL HIGH (ref 6–20)
CO2: 22 mmol/L (ref 22–32)
Calcium: 9.1 mg/dL (ref 8.9–10.3)
Chloride: 104 mmol/L (ref 98–111)
Creatinine, Ser: 1.67 mg/dL — ABNORMAL HIGH (ref 0.44–1.00)
GFR, Estimated: 36 mL/min — ABNORMAL LOW (ref >60)
Glucose, Bld: 105 mg/dL — ABNORMAL HIGH (ref 70–99)
Potassium: 3.9 mmol/L (ref 3.5–5.1)
Sodium: 136 mmol/L (ref 135–145)

## 2022-06-16 LAB — CBC
HCT: 30.5 % — ABNORMAL LOW (ref 36.0–46.0)
Hemoglobin: 9.5 g/dL — ABNORMAL LOW (ref 12.0–15.0)
MCH: 26.8 pg (ref 26.0–34.0)
MCHC: 31.1 g/dL (ref 30.0–36.0)
MCV: 86.2 fL (ref 80.0–100.0)
Platelets: 297 10*3/uL (ref 150–400)
RBC: 3.54 MIL/uL — ABNORMAL LOW (ref 3.87–5.11)
RDW: 16.5 % — ABNORMAL HIGH (ref 11.5–15.5)
WBC: 4.2 10*3/uL (ref 4.0–10.5)
nRBC: 0 % (ref 0.0–0.2)

## 2022-06-16 MED ORDER — SERTRALINE HCL 50 MG PO TABS: 50.0000 mg | ORAL_TABLET | Freq: Every day | ORAL | 2 refills | Status: DC

## 2022-06-16 MED ORDER — PANTOPRAZOLE SODIUM 40 MG PO TBEC: 40.0000 mg | DELAYED_RELEASE_TABLET | Freq: Two times a day (BID) | ORAL | 2 refills | Status: DC

## 2022-06-16 MED ORDER — ADULT MULTIVITAMIN W/MINERALS CH: 1.0000 | ORAL_TABLET | Freq: Every day | ORAL | 2 refills | Status: AC

## 2022-06-16 NOTE — TOC Transition Note (Signed)
Transition of Care [facility]) - CM/SW Discharge Note   Patient Details  Name: Sherry Baker MRN: [id number] Date of Birth: 10-01-1966  [facility]) CM/SW Contact:  Cyndi Bender, RN Phone Number: 06/16/2022, 4:54 PM   Clinical Narrative:     Patient stable for discharge. Therapy recommends Skilled nursing but patient declines.  Patient agreeable to OP rehab.  Patient chose [address] OP rehab. Referral sent. Patient aware that she can call for medicaid transportation to get to apts. Number added to AVS. Patient states she has lost her walker but she just received it a few months ago therefore insurance won't pay for another one. Cost of walker is $42.95. Patient declined walker at this time [facility] number on AVS for patient to call to make apt. Patient requesting taxi voucher home.   Final next level of care: OP Rehab Barriers to Discharge: Barriers Resolved   Patient Goals and CMS Choice   CMS Medicare.gov Compare Post Acute Care list provided to:: Patient Choice offered to / list presented to : Patient  Discharge Placement               home        Discharge Plan and Services                     OP rehab                Social Determinants of Health (SDOH) Interventions     Readmission Risk Interventions    06/16/2022    4:54 PM 04/17/2022   11:21 AM 12/10/2021    1:58 PM  Readmission Risk Prevention Plan  Transportation Screening Complete Complete Complete  Medication Review [employer])  Complete Complete  PCP or Specialist appointment within 3-5 days of discharge Not Complete Complete Complete  PCP/Specialist Appt Not Complete comments patient needs to make apt    [place] or Home Care Consult Complete Complete Not Complete  HRI or Home Care Consult Pt Refusal Comments   history of unsafe home environment  SW Recovery Care/Counseling Consult  Complete Complete  Palliative Care Screening  Not Applicable Not [facility] Patient Refused Patient Refused Not Applicable

## 2022-06-16 NOTE — Discharge Instructions (Addendum)
Thank you for allowing me to take part in your care today.  Per my exam, you are cleared for discharge.  Here is what we discussed below during your hospitalization.  1.  Regarding your hospitalization, you were hospitalized for altered mental status, in which you were found to be intoxicated.  We have taken you off of the medications that we used to help with your withdrawals.  You have been on a Librium taper.  This will be finished.  2.  Regarding your hospitalization, please follow-up with your primary care physician.  Continue to follow-up for your long-term medications outpatient.  3.  We did notice that your hemoglobin dropped slightly during your hospitalization, but it has stabilized.  Please follow-up with your gastroenterologist outpatient.  You may benefit from a colonoscopy and EGD.  4.  Given your vesiculocutaneous fistula, please follow-up with urology outpatient.  5.  You were treated with antibiotics for UTI.  If you develop any UTI symptoms such as pain or burning with urination or blood in your urine, please return back to the hospital.  6.  If you develop any worsening altered mental status or urinary symptoms, please return back to the emergency room.

## 2022-06-16 NOTE — Progress Notes (Signed)
PT Cancellation Note  Patient Details Name: Sherry Baker MRN: [id number] DOB: 1966-04-26   Cancelled Treatment:    Reason Eval/Treat Not Completed: (P) Other (comment) (per RN, pt leaving AMA, defers PT).    Kara Pacer Gavina Dildine 06/16/2022, 2:19 PM

## 2022-06-16 NOTE — Discharge Summary (Addendum)
Name: Sherry Baker MRN: [id number] DOB: 1966/04/09 56 y.o. PCP: Pcp, No  Date of Admission: 06/05/2022  6:58 AM Date of Discharge: 06/16/2022 Attending Physician: Dr. Angelia Mould  Discharge Diagnosis: Principal Problem:   Acute encephalopathy Active Problems:   Rectal cancer ([place])   Polysubstance abuse with associated depression and anxiety   Alcohol use disorder, severe, dependence ([place])   AKI (acute kidney injury) ([place])   Chronic Sacral wound, subsequent encounter   UTI (urinary tract infection)   High anion gap metabolic acidosis   Cocaine abuse with intoxication ([place])   Fistula   History of colon cancer   Normocytic anemia    Discharge Medications: Allergies as of 06/16/2022       Reactions   Penicillins Hives, Swelling   Tolerated Unasyn, Rocephin Did it involve swelling of the face/tongue/throat, SOB, or low BP? Yes Did it involve sudden or severe rash/hives, skin peeling, or any reaction on the inside of your mouth or nose? No Did you need to seek medical attention at a hospital or doctor's office? No When did it last happen?  <10 years   If all above answers are "NO", may proceed with cephalosporin use.        Medication List     STOP taking these medications    feeding supplement Liqd   potassium chloride 10 MEQ tablet Commonly known as: KLOR-CON       TAKE these medications    acetaminophen 325 MG tablet Commonly known as: TYLENOL Take 2 tablets (650 mg total) by mouth every 12 (twelve) hours.   Benzocaine 20 % Aero Commonly known as: HURRCAINE Use as directed 1 Application in the mouth or throat 3 (three) times daily as needed for mouth pain.   busPIRone 15 MG tablet Commonly known as: BUSPAR Take 1 tablet (15 mg total) by mouth 2 (two) times daily.   carvedilol 6.25 MG tablet Commonly known as: COREG Take 1 tablet (6.25 mg total) by mouth 2 (two) times daily with a meal.   folic acid 1 MG tablet Commonly known as: FOLVITE Take 1  tablet (1 mg total) by mouth daily.   magic mouthwash (nystatin, lidocaine, diphenhydrAMINE, alum & mag hydroxide) suspension Swish and spit 5 mLs 4 (four) times daily as needed for mouth pain.   multivitamin with minerals Tabs tablet Take 1 tablet by mouth daily. Start taking on: June 17, 2022   pantoprazole 40 MG tablet Commonly known as: PROTONIX Take 1 tablet (40 mg total) by mouth 2 (two) times daily.   sertraline 50 MG tablet Commonly known as: ZOLOFT Take 1 tablet (50 mg total) by mouth daily. Start taking on: June 17, 2022   thiamine 100 MG tablet Commonly known as: VITAMIN B1 Take 1 tablet (100 mg total) by mouth daily.   umeclidinium-vilanterol 62.5-25 MCG/ACT Aepb Commonly known as: ANORO ELLIPTA Inhale 1 puff into the lungs daily.   Ventolin HFA 108 (90 Base) MCG/ACT inhaler Generic drug: albuterol Inhale 2 puffs into the lungs every 6 (six) hours as needed for wheezing or shortness of breath.               Durable Medical Equipment  (From admission, onward)           Start     Ordered   06/16/22 1335  DME Walker  Once       Question Answer Comment  Walker: Other   Comments Rolling walker 2 wheels   Patient needs a walker to  treat with the following condition Weakness acquired in ICU      06/16/22 1354              Discharge Care Instructions  (From admission, onward)           Start     Ordered   06/16/22 0000  Leave dressing on - Keep it clean, dry, and intact until clinic visit        06/16/22 1354            Disposition and follow-up:   Sherry Baker was discharged from [facility] in Good condition.  At the hospital follow up visit please address:  1.  Follow-up:  a.  Please follow-up with the internal medicine clinic, you have a scheduled appointment on 06/23/2022 with Dr. Marlou Sa at 1:45 PM.  At this time requests getting a CBC and BMP to evaluate your hemoglobin level and your kidney function.  At this time, also good to evaluate patient's  mental state. Patient is discharged on zoloft and is continuing home buspar.     b.  Regarding your hemoglobin, please follow-up with the gastroenterologist outpatient to be evaluated for a colonoscopy and EGD.   c.  Regarding your vesiculocutaneous fistula, please follow-up with urology.   2.  Labs / imaging needed at time of follow-up: CBC and BMP  3.  Pending labs/ test needing follow-up:   4.  Medication Changes: Patient discharged on Zoloft 50 mg daily.  This will be in conjunction with her home BuSpar.  Continue all your other medications as prescribed.    Follow-up Appointments:  Follow-up Information     Leigh Aurora, DO .   Contact information: [place] [address] [address]. Call today.   Why: To be evaluated for hospital follow-up.  And for long-term management medications. Contact information: [address] [place] [phone]        [facility] Schedule an appointment as soon as possible for a visit.   Specialty: Rehabilitation Why: Call to make apt Contact information: [address] [id number] [place] [phone]        Medicaid transportation Follow up.   Contact information: For transportation to apts call  [phone] This is the number on the back of your insurance card                Hospital Course by problem list: Sherry Baker is a 56 year old female with a past medical history of colon cancer status post colostomy, polysubstance/alcohol use disorder, and sacral decubitus ulcer who presented on 8/3 with erratic behavior and was admitted for acute encephalopathy and alcohol/polysubstance withdrawal.  She was transferred to the ICU and intubated on 8/5, extubated and off Precedex drip on 8/7, transferred out of the ICU on  8/10.   Acute encephalopathy Polysubstance use disorder Alcohol use disorder with history of DT Patient presented to the ED via EMS with erratic behavior. She had a similar admission 04/2022. On admission she was essentially non-verbal and would fluctuate between agitated, yelling, and rocking in her bed to sedated. Over the first 2 days she showed worsening agitation and withdrawal symptoms with concerns for airway protection. With her worsening status and prior history of DT we consulted PCCM. She was transferred to ICU on 8/5, intubated and treated with precedex, phenobarbital, seroquel and librium. Extubated and  off precedex drip on 8/7. She was then transferred out of the ICU on 8/10. She showed steady improvement following ICU care and completion of phenobarbital taper with vastly improved mentation. We extended the librium taper by 2 days due to persistent CIWA >5. She completed the librium taper on 8/13 and she was continued on seroquel 50 mg twice daily.  Patient will be discharged on Zoloft 50 mg daily.   Azotemia Concerned for GI bleed (ulcer vs recurrent malignancy) given multiple risk factors including gastritis 2/2 EtOH use, colon cancer, and recent ICU stay with intubation and no other obvious source of bleeding. BUN has precipitously risen over 4-5 days following ICU stay up to 76 with an initially stable creatinine but eventually rose after 4 days to 1.39.  Creatinine is at baseline now.  Hemoglobin showed a delayed decrease from 11.1 to 9.1 in one day then she was stable at 9.  Patient continues to be stable at 9.5.  No subjective signs of bleed.  No blood or dark tarry stools noted in colostomy bag.  Patient will be followed by GI outpatient.  Patient to continue pantoprazole 40 mg twice daily. GI consulted and recommended outpatient follow up for colonoscopy but no immediate intervention without clear signs of GI bleed.   ESBL Klebsiella UTI UC with >100K ESBL klebsiella pneumoniae.  She completed 7 days of meropenem on 8/11. Remove foley on 8/13 with no signs of retention after removal.   Viscerocutaneous fistula  Concern for suprapubic fistula, was supposed to follow up with general surgery for this but was admitted. Now concerning for vesiculocutaneous fistula CT on 8/4 without definite fistula.  Was originally draining urine but while foley catheter was in and patient was mentating well there was no output. She will need follow up outpatient with surgery and/or urology.   Chronic sacral decubitus ulcer Stage IV decubitus ulcer remained clean and care was coordinated by wound care. There we no signs of acute infection of her would during admission.    AKI Admission creatinine of 1.9 with a baseline of 1. AKI improved with IV fluids rapidly and she remained near baseline until BUN rise as above.   AGMA with concomitant NAGMA Prior to ICU transfer her ABG pH of 7.27 with a bicarb of 12.4.  Positive urine anion gap, negative osmolar gap.  Lactic acid of 1.1, negative ethanol, negative salicylates.  Renal ultrasound showed left hydronephrosis and did not visualize the right kidney.  Anion gap originally 16 now down to 12. Repeat ABG and BMP this showed improvement of acidosis with a pH of 7.37 and a bicarb of 19.7.  Ingestion labs were negative for ethylene glycol, methanol, acetone, isopropyl alcohol, ethanol.  This was likely multifactorial and could indicate ingestion or possibly early renal failure.   Iron Deficiency Anemia Baseline hemoglobin appears to be around 11 and she has been around baseline the majority of her admission.  She has not had a colonoscopy since 2018 when she was diagnosed with colon cancer.  Hemoglobin dropped to 9.1 today from 11.1.  Iron studies show iron of 57 and ferritin of 131 which is likely elevated in the setting of inflammation.  We started ferrous sulfate 325 daily and bowel regimen prn. Workup as above.   Depression As she showed improved  mentation she described feelings of depression about her life and health. She has had these feelings before and has seen someone about it outpatient. Prescribed Zoloft with benefit in the past, per patient. Recent  abdominal imaging does not show any signs of cirrhosis. We started her on  Zoloft 50 mg daily with benefit which she will be discharged with along with Buspar.   Rectal cancer status post APR with colostomy 2018 No colonoscopy since diagnosis. High risk for GI ulcer as well as recurrent malignancy. Workup and course as above.   Malnutrition [place] placed in ICU. After a couple days out of ICU she was  requesting food. SLP consulted and patient was started on dysphagia diet. NG tube was removed on 8/12 with adequate PO intake.    Goals of Care She was made a partial code during this admission but as she was mentating better and she was previously a full code we will have further discussions about goals of care with her.   Discharge Subjective:  Patient was seen during rounds today.  Patient reports that she is doing fine.  She states that she is able to void with no issue.  She states that she is ready to go home.  She does not want to go to a SNF.  She reports that she is safe to go home.  She understands the risk of going home versus a SNF.  She accepts these risk.  Patient will be discharged home today.  Discharge Exam:   BP (!) 154/76 (BP Location: Right Leg)   Pulse (!) 136   Temp 97.9 F (36.6 C) (Oral)   Resp 18   Ht '5\' 2"'$  (1.575 m)   Wt 49.2 kg   SpO2 (!) 87%   BMI 19.84 kg/m  Constitutional: well-appearing lying in bed in no acute distress. HENT: normocephalic atraumatic, mucous membranes moist Eyes: conjunctiva non-erythematous Neck: supple Cardiovascular: regular rate and rhythm, no m/r/g Pulmonary/Chest: normal work of breathing on room air, lungs clear to auscultation bilaterally Abdominal: soft, non-tender, non-distended with colostomy bag full of feces with  no blood noted. MSK: normal bulk and tone Neurological: alert & oriented x 3, 5/5 strength in bilateral upper and lower extremities, normal gait Skin: warm and dry Psych: Nonagitated and coherent  Pertinent Labs, Studies, and Procedures:     Latest Ref Rng & Units 06/16/2022    7:49 AM 06/15/2022    3:35 AM 06/14/2022    3:27 AM  CBC  WBC 4.0 - 10.5 K/uL 4.2  3.5  3.8   Hemoglobin 12.0 - 15.0 g/dL 9.5  9.0  9.1   Hematocrit 36.0 - 46.0 % 30.5  27.5  28.7   Platelets 150 - 400 K/uL 297  256  266        Latest Ref Rng & Units 06/16/2022    7:49 AM 06/15/2022    3:35 AM 06/14/2022    3:27 AM  CMP  Glucose 70 - 99 mg/dL 105  95  96   BUN 6 - 20 mg/dL 62  79  72   Creatinine 0.44 - 1.00 mg/dL 1.67  1.39  1.29   Sodium 135 - 145 mmol/L 136  137  138   Potassium 3.5 - 5.1 mmol/L 3.9  4.3  4.5   Chloride 98 - 111 mmol/L 104  103  106   CO2 22 - 32 mmol/L '22  25  23   '$ Calcium 8.9 - 10.3 mg/dL 9.1  9.2  9.0     CT ABDOMEN PELVIS W CONTRAST  Result Date: 06/06/2022 CLINICAL DATA:  Abdominal pain, acute, nonlocalized There is an unknown fistula in the suprapubic region anteriorly that is draining clear liquid appears  to be urine. No recorded history of indwelling suprapubic catheters known EXAM: CT ABDOMEN AND PELVIS WITH CONTRAST TECHNIQUE: Multidetector CT imaging of the abdomen and pelvis was performed using the standard protocol following bolus administration of intravenous contrast. RADIATION DOSE REDUCTION: This exam was performed according to the departmental dose-optimization program which includes automated exposure control, adjustment of the mA and/or kV according to patient size and/or use of iterative reconstruction technique. CONTRAST:  62m OMNIPAQUE IOHEXOL 300 MG/ML  SOLN COMPARISON:  06/01/2022 FINDINGS: Lower chest: Not visualized Hepatobiliary: Liver is not imaged in its entirety. No visible focal hepatic abnormality in the areas imaged. Gallbladder unremarkable. Pancreas: No  focal abnormality or ductal dilatation. Spleen: Spleen not imaged in its entirety. No focal abnormality in the portions imaged. Adrenals/Urinary Tract: Right kidney is atrophic. No stones or hydronephrosis. No renal or adrenal mass. Mild wall thickening within the urinary bladder. No definite visible fistula seen extending from the bladder to the anterior abdominal wall. Stomach/Bowel: Left lower quadrant ostomy noted. Parastomal hernia contains predominantly fat. Findings are stable since prior study. No bowel obstruction. Prior distal colectomy. Vascular/Lymphatic: Aortic atherosclerosis. No evidence of aneurysm or adenopathy. Reproductive: No visible focal abnormality. Other: Presacral soft tissue thickening again noted, asymmetric to the left, unchanged since recent study. No free fluid or free air. Musculoskeletal: Large sacral decubitus ulcer noted. This is unchanged since prior study. No evidence for osteomyelitis. Advanced degenerative changes within the hips bilaterally. IMPRESSION: Postoperative changes from distal colectomy. Left lower quadrant ostomy again noted, unchanged. Stable parastomal hernia containing fat. Aortic atherosclerosis. Mild gallbladder wall thickening. This could reflect cystitis. Recommend clinical correlation. Stable presacral soft tissue thickening favored represent postoperative changes. Recommend attention on follow-up imaging or PET CT. Large sacral decubitus ulcer unchanged. No evidence of osteomyelitis. Electronically Signed   By: KRolm BaptiseM.D.   On: 06/06/2022 20:23   [place]RENAL  Result Date: 06/05/2022 CLINICAL DATA:  Acute kidney injury EXAM: RENAL / URINARY TRACT ULTRASOUND COMPLETE COMPARISON:  None Available. FINDINGS: Right Kidney: Not visualized. Left Kidney: Renal measurements: 9.9 x 5.1 x 4.6 cm = volume: 122 mL. Echogenicity within normal limits. No mass visualized. Mild hydronephrosis. Bladder: Appears normal for degree of bladder distention. Other: None.  IMPRESSION: Mild left hydronephrosis.  Right kidney not visualized. Electronically Signed   By: LYetta GlassmanM.D.   On: 06/05/2022 10:07   CT HEAD WO CONTRAST (5MM)  Result Date: 06/05/2022 CLINICAL DATA:  Mental status change, unknown cause EXAM: CT HEAD WITHOUT CONTRAST TECHNIQUE: Contiguous axial images were obtained from the base of the skull through the vertex without intravenous contrast. RADIATION DOSE REDUCTION: This exam was performed according to the departmental dose-optimization program which includes automated exposure control, adjustment of the mA and/or kV according to patient size and/or use of iterative reconstruction technique. COMPARISON:  CT head April 24, 2022. FINDINGS: Brain: No evidence of acute infarction, hemorrhage, hydrocephalus, extra-axial collection or mass lesion/mass effect. Similar chronic microvascular ischemic disease and atrophy. Vascular: No hyperdense vessel identified. Skull: No acute fracture. Sinuses/Orbits: Mild paranasal sinus mucosal thickening. No acute orbital findings. Other: No mastoid effusions.  Six IMPRESSION: No evidence of acute intracranial abnormality. Electronically Signed   By: FMargaretha SheffieldM.D.   On: 06/05/2022 08:09   DG Chest Port 1 View  Result Date: 06/05/2022 CLINICAL DATA:  Provided history: Altered mental status. EXAM: PORTABLE CHEST 1 VIEW COMPARISON:  Prior chest radiographs 04/24/2022 and earlier. FINDINGS: Heart size within normal limits. No appreciable airspace consolidation or  pulmonary edema. No evidence of pleural effusion or pneumothorax. No acute bony abnormality identified. IMPRESSION: No evidence of acute cardiopulmonary abnormality. Electronically Signed   By: Kellie Simmering D.O.   On: 06/05/2022 08:07     Discharge Instructions: Discharge Instructions     Ambulatory referral to Occupational Therapy   Complete by: As directed    Ambulatory referral to Physical Therapy   Complete by: As directed    Call MD for:   difficulty breathing, headache or visual disturbances   Complete by: As directed    Call MD for:  redness, tenderness, or signs of infection (pain, swelling, redness, odor or green/yellow discharge around incision site)   Complete by: As directed    Call MD for:  severe uncontrolled pain   Complete by: As directed    Call MD for:  temperature >100.4   Complete by: As directed    Diet - low sodium heart healthy   Complete by: As directed    Increase activity slowly   Complete by: As directed    Leave dressing on - Keep it clean, dry, and intact until clinic visit   Complete by: As directed        Signed: Leigh Aurora, DO 06/16/2022, 8:13 PM   Pager: [phone]

## 2022-06-16 NOTE — Progress Notes (Addendum)
Daily Rounding Note  06/16/2022, 3:01 PM  LOS: 11 days   SUBJECTIVE:   Chief complaint:     Stool via ostomy continues brown.  Patient says she hurts all over which includes abdominal pain, this is nothing new.  No nausea.  Appetite fair, p.o. intake fair. Plan is for her discharge later today.  OBJECTIVE:         Vital signs in last 24 hours:    Temp:  [97.7 F (36.5 C)-97.9 F (36.6 C)] 97.9 F (36.6 C) (08/14 0906) Pulse Rate:  [87-180] 136 (08/14 0918) Resp:  [16-18] 18 (08/14 0906) BP: (143-179)/(70-76) 154/76 (08/14 0906) SpO2:  [80 %-99 %] 87 % (08/14 0918) Last BM Date : 06/16/22 Filed Weights   06/05/22 0706  Weight: 49.2 kg   General: Looks at least a decade older than stated age.  Run down, malnourished appearing. Heart: RRR. Chest: Diminished breath sounds with a few rales on the right base but otherwise clear. Abdomen: Soft without tenderness or distention. Extremities: No CCE Neuro/Psych: Oriented x3.  Intake/Output from previous day: 08/13 0701 - 08/14 0700 In: -  Out: 2000 [Urine:1600; Stool:400]  Intake/Output this shift: No intake/output data recorded.  Lab Results: Recent Labs    06/14/22 0327 06/15/22 0335 06/16/22 0749  WBC 3.8* 3.5* 4.2  HGB 9.1* 9.0* 9.5*  HCT 28.7* 27.5* 30.5*  PLT 266 256 297   BMET Recent Labs    06/14/22 0327 06/15/22 0335 06/16/22 0749  NA 138 137 136  K 4.5 4.3 3.9  CL 106 103 104  CO2 '23 25 22  '$ GLUCOSE 96 95 105*  BUN 72* 79* 62*  CREATININE 1.29* 1.39* 1.67*  CALCIUM 9.0 9.2 9.1   LFT No results for input(s): "PROT", "ALBUMIN", "AST", "ALT", "ALKPHOS", "BILITOT", "BILIDIR", "IBILI" in the last 72 hours. PT/INR No results for input(s): "LABPROT", "INR" in the last 72 hours. Hepatitis Panel No results for input(s): "HEPBSAG", "HCVAB", "HEPAIGM", "HEPBIGM" in the last 72 hours.  Studies/Results: No results found.  Scheduled Meds:   vitamin C  250 mg Oral BID   busPIRone  15 mg Oral BID   Chlorhexidine Gluconate Cloth  6 each Topical Daily   enoxaparin (LOVENOX) injection  40 mg Subcutaneous Q24H   multivitamin with minerals  1 tablet Oral Daily   pantoprazole  40 mg Oral BID   QUEtiapine  50 mg Oral BID   sertraline  50 mg Oral Daily   thiamine  100 mg Oral Daily   umeclidinium-vilanterol  1 puff Inhalation Daily   zinc sulfate  220 mg Oral Daily   Continuous Infusions:  sodium chloride Stopped (06/10/22 1015)   PRN Meds:.sodium chloride, albuterol   ASSESMENT:   Chronic [place] anemia.  Hgb stable 9 to 9.5.  Baseline 9-12 over the last several months.  Iron, ferritin, TIBC, iron sats, folate, B12 WNL.  Protonix 40 po bid in place.  No PPI, H2B PTA.      Chronic NSAID use.  Rectal cancer 2018 treated with APR, end colostomy.  Has not had colonoscopy since then.  CT demonstrates stable parastomal hernia containing fat.  Polysubstance/EtOH abuse.  Currently on 1 of multiple admissions with encephalopathy, active withdrawal.  Earlier in admission required Precedex, ICU, intubation.  Prior ultrasound and current CT shows no liver parenchyma issues, nonspecific GB wall thickening.  ESBL Klebsiella UTI.  Pulmonary nodules under evaluation by pulmonary.  Large sacral decubitus ulcer.   PLAN  Discharging today.  Continue Protonix 40 mg p.o. bid for 1 month then dose 40 mg daily.  Regular diet.  CBC recheck in a month or so w PCP.  Plan is fup w [facility] but needs to call for appt.      Defer decision re GI fup to attending GI.      Abstinence from ETOH.       Azucena Freed  06/16/2022, 3:01 PM Phone [phone]

## 2022-06-19 ENCOUNTER — Ambulatory Visit: Payer: Medicaid Other | Admitting: Critical Care Medicine

## 2022-06-19 ENCOUNTER — Emergency Department (HOSPITAL_COMMUNITY): Payer: Medicaid Other

## 2022-06-19 ENCOUNTER — Other Ambulatory Visit: Payer: Self-pay

## 2022-06-19 ENCOUNTER — Emergency Department (HOSPITAL_COMMUNITY)
Admission: EM | Admit: 2022-06-19 | Discharge: 2022-06-19 | Disposition: A | Discharge status: Home / Self Care | Payer: Medicaid Other | Attending: Emergency Medicine | Admitting: Emergency Medicine

## 2022-06-19 DIAGNOSIS — R109 Unspecified abdominal pain: Secondary | ICD-10-CM | POA: Diagnosis present

## 2022-06-19 DIAGNOSIS — F419 Anxiety disorder, unspecified: Secondary | ICD-10-CM | POA: Insufficient documentation

## 2022-06-19 DIAGNOSIS — Z85038 Personal history of other malignant neoplasm of large intestine: Secondary | ICD-10-CM | POA: Diagnosis not present

## 2022-06-19 DIAGNOSIS — R1032 Left lower quadrant pain: Secondary | ICD-10-CM | POA: Diagnosis not present

## 2022-06-19 LAB — CBC WITH DIFFERENTIAL/PLATELET
Abs Immature Granulocytes: 0.06 10*3/uL (ref 0.00–0.07)
Basophils Absolute: 0 10*3/uL (ref 0.0–0.1)
Basophils Relative: 0 %
Eosinophils Absolute: 0.2 10*3/uL (ref 0.0–0.5)
Eosinophils Relative: 4 %
HCT: 33 % — ABNORMAL LOW (ref 36.0–46.0)
Hemoglobin: 10.2 g/dL — ABNORMAL LOW (ref 12.0–15.0)
Immature Granulocytes: 1 %
Lymphocytes Relative: 20 %
Lymphs Abs: 1.3 10*3/uL (ref 0.7–4.0)
MCH: 27.3 pg (ref 26.0–34.0)
MCHC: 30.9 g/dL (ref 30.0–36.0)
MCV: 88.5 fL (ref 80.0–100.0)
Monocytes Absolute: 0.7 10*3/uL (ref 0.1–1.0)
Monocytes Relative: 11 %
Neutro Abs: 4 10*3/uL (ref 1.7–7.7)
Neutrophils Relative %: 64 %
Platelets: 373 10*3/uL (ref 150–400)
RBC: 3.73 MIL/uL — ABNORMAL LOW (ref 3.87–5.11)
RDW: 16.2 % — ABNORMAL HIGH (ref 11.5–15.5)
WBC: 6.3 10*3/uL (ref 4.0–10.5)
nRBC: 0 % (ref 0.0–0.2)

## 2022-06-19 LAB — COMPREHENSIVE METABOLIC PANEL
ALT: 21 U/L (ref 0–44)
AST: 23 U/L (ref 15–41)
Albumin: 3.3 g/dL — ABNORMAL LOW (ref 3.5–5.0)
Alkaline Phosphatase: 108 U/L (ref 38–126)
Anion gap: 10 (ref 5–15)
BUN: 44 mg/dL — ABNORMAL HIGH (ref 6–20)
CO2: 19 mmol/L — ABNORMAL LOW (ref 22–32)
Calcium: 8.9 mg/dL (ref 8.9–10.3)
Chloride: 109 mmol/L (ref 98–111)
Creatinine, Ser: 1.43 mg/dL — ABNORMAL HIGH (ref 0.44–1.00)
GFR, Estimated: 44 mL/min — ABNORMAL LOW (ref >60)
Glucose, Bld: 93 mg/dL (ref 70–99)
Potassium: 4.3 mmol/L (ref 3.5–5.1)
Sodium: 138 mmol/L (ref 135–145)
Total Bilirubin: 0.5 mg/dL (ref 0.3–1.2)
Total Protein: 8.2 g/dL — ABNORMAL HIGH (ref 6.5–8.1)

## 2022-06-19 LAB — LIPASE, BLOOD: Lipase: 56 U/L — ABNORMAL HIGH (ref 11–51)

## 2022-06-19 MED ORDER — CARVEDILOL 3.125 MG PO TABS
6.2500 mg | ORAL_TABLET | Freq: Once | ORAL | Status: AC
Start: 2022-06-19 — End: 2022-06-19
  Administered 2022-06-19: 6.25 mg via ORAL
  Filled 2022-06-19: qty 2

## 2022-06-19 MED ORDER — OXYCODONE HCL 5 MG PO TABS
5.0000 mg | ORAL_TABLET | Freq: Once | ORAL | Status: AC
  Administered 2022-06-19: 5 mg via ORAL
  Filled 2022-06-19: qty 1

## 2022-06-19 MED ORDER — IOHEXOL 300 MG/ML  SOLN
100.0000 mL | Freq: Once | INTRAMUSCULAR | Status: AC | PRN
  Administered 2022-06-19: 80 mL via INTRAVENOUS

## 2022-06-19 NOTE — ED Notes (Signed)
Got patient on the monitor patient is resting with nurse at bedside and police outside the door

## 2022-06-19 NOTE — ED Triage Notes (Signed)
[facility]IB [facility] c/o LLQ abdominal pain after colostomy bag was broken.

## 2022-06-19 NOTE — Discharge Instructions (Addendum)
You were evaluated for abdominal pain, your imaging and labs were not concerning for any acute issues with your abdomen.   Please make sure to follow-up with the ER or your GI doctor if you notice further worsening of the symptoms, bleeding from your colostomy site, or start having fevers.

## 2022-06-19 NOTE — ED Provider Notes (Signed)
[facility] Provider Note   CSN: [id number] Arrival date & time: 06/19/22  9528     History  Chief Complaint  Patient presents with   Abdominal Pain    Sherry Baker is a 56 y.o. femalewith a past medical history of colon cancer status post colostomy, polysubstance/alcohol use disorder, and sacral decubitus ulcer presenting for colostomy related pain. Patient reports that the pain started today after she had hit the side of the table and ripped her colostomy bag. The bag has since been replaced but she states it hurts when she cleans it. She denies any recent drug or alcohol use but notes that the person she lives with does.  Of note, patient was recently discharged from the hospital 8/14 for acute encephalopathy related to polysubstance abuse and required ICU for precedex drip.   Patient was also arrested this morning on outstanding warrants.   Abdominal Pain Associated symptoms: no chills, no diarrhea, no fatigue, no fever, no nausea, no shortness of breath and no vomiting        Home Medications Prior to Admission medications   Medication Sig Start Date End Date Taking? Authorizing Provider  acetaminophen (TYLENOL) 325 MG tablet Take 2 tablets (650 mg total) by mouth every 12 (twelve) hours. Patient not taking: Reported on 06/19/2022 12/13/21   Samella Parr, NP  albuterol (VENTOLIN HFA) 108 (90 Base) MCG/ACT inhaler Inhale 2 puffs into the lungs every 6 (six) hours as needed for wheezing or shortness of breath. Patient not taking: Reported on 06/19/2022 04/17/22   Shelly Coss, MD  Benzocaine (HURRCAINE) 20 % AERO Use as directed 1 Application in the mouth or throat 3 (three) times daily as needed for mouth pain. Patient not taking: Reported on 06/19/2022 04/17/22   Shelly Coss, MD  busPIRone (BUSPAR) 15 MG tablet Take 1 tablet (15 mg total) by mouth 2 (two) times daily. Patient not taking: Reported on 06/19/2022 04/17/22   Shelly Coss, MD  carvedilol (COREG) 6.25 MG tablet Take 1 tablet (6.25 mg total) by mouth 2 (two) times daily with a meal. Patient not taking: Reported on 06/19/2022 04/17/22   Shelly Coss, MD  folic acid (FOLVITE) 1 MG tablet Take 1 tablet (1 mg total) by mouth daily. Patient not taking: Reported on 06/19/2022 04/17/22   Shelly Coss, MD  magic mouthwash (nystatin, lidocaine, diphenhydrAMINE, alum & mag hydroxide) suspension Swish and spit 5 mLs 4 (four) times daily as needed for mouth pain. Patient not taking: Reported on 06/19/2022 04/22/22   Hans Eden, NP  Multiple Vitamin (MULTIVITAMIN WITH MINERALS) TABS tablet Take 1 tablet by mouth daily. Patient not taking: Reported on 06/19/2022 06/17/22 09/15/22  Leigh Aurora, DO  pantoprazole (PROTONIX) 40 MG tablet Take 1 tablet (40 mg total) by mouth 2 (two) times daily. Patient not taking: Reported on 06/19/2022 06/16/22 09/14/22  Leigh Aurora, DO  sertraline (ZOLOFT) 50 MG tablet Take 1 tablet (50 mg total) by mouth daily. Patient not taking: Reported on 06/19/2022 06/17/22 09/15/22  Leigh Aurora, DO  thiamine 100 MG tablet Take 1 tablet (100 mg total) by mouth daily. Patient not taking: Reported on 05/28/2022 04/17/22   Shelly Coss, MD  umeclidinium-vilanterol [facility] ELLIPTA) 62.5-25 MCG/ACT AEPB Inhale 1 puff into the lungs daily. Patient not taking: Reported on 06/19/2022 04/17/22   Shelly Coss, MD  ziprasidone (GEODON) 40 MG capsule Take 1 capsule (40 mg total) by mouth 2 (two) times daily with a meal. 11/06/11 01/09/12  Eulis Foster,  Vira Agar, MD      Allergies    Penicillins    Review of Systems   Review of Systems  Constitutional:  Negative for activity change, appetite change, chills, diaphoresis, fatigue and fever.  HENT:  Negative for congestion and trouble swallowing.   Eyes:  Negative for visual disturbance.  Respiratory:  Negative for chest tightness and shortness of breath.   Cardiovascular:  Negative for palpitations and leg swelling.   Gastrointestinal:  Positive for abdominal pain (at colostomy site). Negative for diarrhea, nausea and vomiting.  Genitourinary:  Negative for decreased urine volume and difficulty urinating.  Musculoskeletal:  Negative for arthralgias.  Neurological:  Negative for dizziness, syncope and weakness.    Physical Exam Updated Vital Signs BP 115/71   Pulse 88   Temp 98.4 F (36.9 C) (Oral)   Resp 16   Ht '5\' 2"'$  (1.575 m)   Wt 49.9 kg   SpO2 97%   BMI 20.12 kg/m  Physical Exam Constitutional:      General: She is not in acute distress.    Comments: Appears much older than age and is chronically ill appearing  HENT:     Head: Normocephalic and atraumatic.     Mouth/Throat:     Mouth: Mucous membranes are moist.     Pharynx: Oropharynx is clear.  Eyes:     Extraocular Movements: Extraocular movements intact.     Pupils: Pupils are equal, round, and reactive to light.  Cardiovascular:     Rate and Rhythm: Normal rate and regular rhythm.     Heart sounds: Normal heart sounds.  Pulmonary:     Effort: Pulmonary effort is normal.     Breath sounds: Normal breath sounds.  Abdominal:     General: Abdomen is flat. Bowel sounds are normal.     Palpations: Abdomen is soft. There is no shifting dullness or fluid wave.     Tenderness: There is abdominal tenderness (at colostomy site) in the left lower quadrant. There is no guarding or rebound.     Comments: Colostomy bag with clean edges and brown stool noted in the LLQ  Skin:    General: Skin is warm and dry.     Capillary Refill: Capillary refill takes less than 2 seconds.  Neurological:     Mental Status: She is alert.  Psychiatric:        Mood and Affect: Mood is anxious (intermittently teaful in exam room).     ED Results / Procedures / Treatments   Labs (all labs ordered are listed, but only abnormal results are displayed) Labs Reviewed  COMPREHENSIVE METABOLIC PANEL - Abnormal; Notable for the following components:       Result Value   CO2 19 (*)    BUN 44 (*)    Creatinine, Ser 1.43 (*)    Total Protein 8.2 (*)    Albumin 3.3 (*)    GFR, Estimated 44 (*)    All other components within normal limits  LIPASE, BLOOD - Abnormal; Notable for the following components:   Lipase 56 (*)    All other components within normal limits  CBC WITH DIFFERENTIAL/PLATELET - Abnormal; Notable for the following components:   RBC 3.73 (*)    Hemoglobin 10.2 (*)    HCT 33.0 (*)    RDW 16.2 (*)    All other components within normal limits    EKG None  Radiology CT Abdomen Pelvis W Contrast  Result Date: 06/19/2022 CLINICAL DATA:  Left lower quadrant  abdominal pain EXAM: CT ABDOMEN AND PELVIS WITH CONTRAST TECHNIQUE: Multidetector CT imaging of the abdomen and pelvis was performed using the standard protocol following bolus administration of intravenous contrast. RADIATION DOSE REDUCTION: This exam was performed according to the departmental dose-optimization program which includes automated exposure control, adjustment of the mA and/or kV according to patient size and/or use of iterative reconstruction technique. CONTRAST:  41m OMNIPAQUE IOHEXOL 300 MG/ML  SOLN COMPARISON:  June 06, 2022 FINDINGS: Lower chest: Mild bibasilar subpleural reticular opacities, likely sequela of fibrotic lung changes. Normal heart size no pericardial effusion. Hepatobiliary: No focal liver abnormality is seen. No gallstones, gallbladder wall thickening, or biliary dilatation. Pancreas: Unremarkable. No pancreatic ductal dilatation or surrounding inflammatory changes. Spleen: Normal in size without focal abnormality. Adrenals/Urinary Tract: Normal adrenal glands. Severe atrophy of the right kidney. No hydronephrosis. The urinary bladder is normal. Stomach/Bowel: Normal stomach. Postoperative sequela of abdominoperineal resection with left lower quadrant colostomy. Unchanged appearance of a small parastomal hernia containing predominantly fat and a  small loop of unobstructed colon. Minimally distended loops of small bowel within the left hemiabdomen without a transition point. Similar appearance of the presacral soft tissue thickening (series 5, image 65). Vascular/Lymphatic: Aortic atherosclerosis. No enlarged abdominal or pelvic lymph nodes. Reproductive: Atrophic uterus and ovaries. Other: No abdominopelvic ascites or pneumoperitoneum. Musculoskeletal: Redemonstrated large sacral decubitus ulcer. No destructive osseous changes noted in the sacrum or coccyx. Severe bilateral hip degenerative changes. No acute osseous abnormality. IMPRESSION: 1. Sequela of prior APR with a left lower quadrant colostomy. There is no evidence of a complete bowel obstruction, however there are minimally distended loops of small bowel within the left hemiabdomen, suggestive of developing ileus. 2. Unchanged large sacral decubitus ulcer without CT evidence of osteomyelitis. 3. Similar appearance of the presacral soft tissue thickening, which likely represents chronic scarring. However, residual or recurrent neoplasm is not excluded. Recommend close attention on follow-up and consider PET-CT for further assessment. 4.  Aortic Atherosclerosis (ICD10-I70.0). Electronically Signed   By: MBeryle FlockM.D.   On: 06/19/2022 12:10    Procedures Procedures   Medications Ordered in ED Medications  iohexol (OMNIPAQUE) 300 MG/ML solution 100 mL (80 mLs Intravenous Contrast Given 06/19/22 1145)  carvedilol (COREG) tablet 6.25 mg (6.25 mg Oral Given 06/19/22 1401)  oxyCODONE (Oxy IR/ROXICODONE) immediate release tablet 5 mg (5 mg Oral Given 06/19/22 1401)    ED Course/ Medical Decision Making/ A&P                           Medical Decision Making Risk Prescription drug management.   Sherry MZAKAYLA MARTINECis a 56y.o. femalewith a past medical history of colon cancer status post colostomy, polysubstance/alcohol use disorder, and sacral decubitus ulcer presenting for colostomy  related pain after hitting in on the corner of the table and ripping her old colostomy bag. A new one has since been replaced and site appears well on exam.   CT abdomen/pelvis noted LLQ colostomy with minimally distended loops of bowel suggesting developing ileus, unchanged large sacral decubitus ulcer without osteomyelitis noted. Lab work was notable for mildly elevated lipase of 56 and all other lab abnormalities appear close to patient's baseline based on recent hospital records.   At this time, exam and findings do not seem consistent with any acute abdomen picture. The colostomy site is overall well appearing without any evidence of bleeding.  Feel that patient is safe for discharge with officer present.  Final Clinical Impression(s) / ED Diagnoses Final diagnoses:  Left lower quadrant abdominal pain    Rx / DC Orders ED Discharge Orders     None         Connee Ikner, DO 82/95/62 1308    Lianne Cure, DO 65/78/46 [phone]

## 2022-06-19 NOTE — ED Notes (Signed)
Ordered ostomy supplies for pt

## 2022-06-19 NOTE — ED Provider Triage Note (Signed)
Emergency Medicine Provider Triage Evaluation Note  Sherry Baker , a 56 y.o. female  was evaluated in triage.  Pt complains of abdominal pain, left lower quadrant, going on for last couple days, she concerned that her ostomy is infected, associated nausea without vomiting no other complaints..  Review of Systems  Positive: Abdominal pain, infection Negative: Vomiting, diarrhea  Physical Exam  BP (!) 194/104 (BP Location: Left Arm)   Pulse (!) 112   Temp 98.5 F (36.9 C) (Oral)   Resp 20   Ht '5\' 2"'$  (1.575 m)   Wt 49.9 kg   SpO2 100%   BMI 20.12 kg/m  Gen:   Awake, no distress   Resp:  Normal effort  MSK:   Moves extremities without difficulty  Other:    Medical Decision Making  Medically screening exam initiated at 6:54 AM.  Appropriate orders placed.  Sherry Baker was informed that the remainder of the evaluation will be completed by another provider, this initial triage assessment does not replace that evaluation, and the importance of remaining in the ED until their evaluation is complete.  Lab work imaging been ordered will need further work-up.   Sherry Fennel, PA-C 06/19/22 [phone]

## 2022-06-23 ENCOUNTER — Ambulatory Visit: Payer: Medicaid Other | Admitting: Internal Medicine

## 2022-07-03 ENCOUNTER — Ambulatory Visit (HOSPITAL_COMMUNITY): Payer: Medicaid Other

## 2022-07-09 ENCOUNTER — Telehealth: Payer: Self-pay | Admitting: *Deleted

## 2022-07-09 NOTE — Telephone Encounter (Signed)
Patient asking for Hospice referral and listed Dr. Benay Spice as her PCP. Informed representative that she was last seen her in 2020 and her rectal cancer was in remission. Our office is not her PCP. Provided name of renal physician that ordered a script for her in 2023 to f/u.

## 2022-07-18 ENCOUNTER — Other Ambulatory Visit: Payer: Self-pay

## 2022-07-18 ENCOUNTER — Emergency Department (HOSPITAL_COMMUNITY): Payer: Medicaid Other

## 2022-07-18 ENCOUNTER — Emergency Department (HOSPITAL_COMMUNITY)
Admission: EM | Admit: 2022-07-18 | Discharge: 2022-07-19 | Disposition: A | Discharge status: Home / Self Care | Payer: Medicaid Other | Attending: Emergency Medicine | Admitting: Emergency Medicine

## 2022-07-18 ENCOUNTER — Encounter (HOSPITAL_COMMUNITY): Payer: Self-pay

## 2022-07-18 DIAGNOSIS — Z85048 Personal history of other malignant neoplasm of rectum, rectosigmoid junction, and anus: Secondary | ICD-10-CM | POA: Diagnosis not present

## 2022-07-18 DIAGNOSIS — R4182 Altered mental status, unspecified: Secondary | ICD-10-CM | POA: Diagnosis present

## 2022-07-18 DIAGNOSIS — N189 Chronic kidney disease, unspecified: Secondary | ICD-10-CM | POA: Diagnosis not present

## 2022-07-18 DIAGNOSIS — E860 Dehydration: Secondary | ICD-10-CM | POA: Insufficient documentation

## 2022-07-18 DIAGNOSIS — D631 Anemia in chronic kidney disease: Secondary | ICD-10-CM | POA: Insufficient documentation

## 2022-07-18 DIAGNOSIS — E162 Hypoglycemia, unspecified: Secondary | ICD-10-CM | POA: Diagnosis not present
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
## 2022-07-18 NOTE — ED Provider Triage Note (Signed)
Emergency Medicine Provider Triage Evaluation Note  Sherry Baker , a 56 y.o. female  was evaluated in triage.  Patient brought in by EMS for confusion.  Per EMS patient was not responding to their questions.  They also are well acquainted with her and states that this is her baseline.  She has history of polysubstance abuse.  She is arousable to verbal stimuli on exam.  Denies any complaints.  Review of Systems  Positive: As above Negative: As above  Physical Exam  BP (!) 131/97   Pulse 86   Temp (!) 97.5 F (36.4 C) (Axillary)   Resp 20   SpO2 95%  Gen:   Awake, no distress   Resp:  Normal effort  MSK:   Moves extremities without difficulty  Other:    Medical Decision Making  Medically screening exam initiated at 8:11 PM.  Appropriate orders placed.  [place] was informed that the remainder of the evaluation will be completed by another provider, this initial triage assessment does not replace that evaluation, and the importance of remaining in the ED until their evaluation is complete.  Labs and imaging ordered.   Evlyn Courier, PA-C 07/18/22 2024

## 2022-07-18 NOTE — ED Notes (Signed)
Pt is altered and uncooperative. I was unable to obtain any blood.

## 2022-07-19 ENCOUNTER — Emergency Department (HOSPITAL_COMMUNITY): Payer: Medicaid Other

## 2022-07-19 LAB — CBC WITH DIFFERENTIAL/PLATELET
Abs Immature Granulocytes: 0.04 10*3/uL (ref 0.00–0.07)
Basophils Absolute: 0 10*3/uL (ref 0.0–0.1)
Basophils Relative: 0 %
Eosinophils Absolute: 0.1 10*3/uL (ref 0.0–0.5)
Eosinophils Relative: 2 %
HCT: 34.2 % — ABNORMAL LOW (ref 36.0–46.0)
Hemoglobin: 10.8 g/dL — ABNORMAL LOW (ref 12.0–15.0)
Immature Granulocytes: 1 %
Lymphocytes Relative: 19 %
Lymphs Abs: 0.9 10*3/uL (ref 0.7–4.0)
MCH: 28.1 pg (ref 26.0–34.0)
MCHC: 31.6 g/dL (ref 30.0–36.0)
MCV: 89.1 fL (ref 80.0–100.0)
Monocytes Absolute: 0.5 10*3/uL (ref 0.1–1.0)
Monocytes Relative: 11 %
Neutro Abs: 3.2 10*3/uL (ref 1.7–7.7)
Neutrophils Relative %: 67 %
Platelets: 204 10*3/uL (ref 150–400)
RBC: 3.84 MIL/uL — ABNORMAL LOW (ref 3.87–5.11)
RDW: 18.2 % — ABNORMAL HIGH (ref 11.5–15.5)
WBC: 4.8 10*3/uL (ref 4.0–10.5)
nRBC: 0 % (ref 0.0–0.2)

## 2022-07-19 LAB — COMPREHENSIVE METABOLIC PANEL
ALT: 10 U/L (ref 0–44)
AST: 17 U/L (ref 15–41)
Albumin: 3.2 g/dL — ABNORMAL LOW (ref 3.5–5.0)
Alkaline Phosphatase: 123 U/L (ref 38–126)
Anion gap: 13 (ref 5–15)
BUN: 29 mg/dL — ABNORMAL HIGH (ref 6–20)
CO2: 15 mmol/L — ABNORMAL LOW (ref 22–32)
Calcium: 9.1 mg/dL (ref 8.9–10.3)
Chloride: 110 mmol/L (ref 98–111)
Creatinine, Ser: 1.72 mg/dL — ABNORMAL HIGH (ref 0.44–1.00)
GFR, Estimated: 35 mL/min — ABNORMAL LOW (ref >60)
Glucose, Bld: 62 mg/dL — ABNORMAL LOW (ref 70–99)
Potassium: 3.8 mmol/L (ref 3.5–5.1)
Sodium: 138 mmol/L (ref 135–145)
Total Bilirubin: 0.6 mg/dL (ref 0.3–1.2)
Total Protein: 7.9 g/dL (ref 6.5–8.1)

## 2022-07-19 LAB — CBG MONITORING, ED
Glucose-Capillary: 104 mg/dL — ABNORMAL HIGH (ref 70–99)
Glucose-Capillary: 64 mg/dL — ABNORMAL LOW (ref 70–99)
Glucose-Capillary: 148 mg/dL — ABNORMAL HIGH (ref 70–99)
Glucose-Capillary: 98 mg/dL (ref 70–99)

## 2022-07-19 LAB — SALICYLATE LEVEL: Salicylate Lvl: <7 mg/dL — ABNORMAL LOW (ref 7.0–30.0)

## 2022-07-19 LAB — ACETAMINOPHEN LEVEL: Acetaminophen (Tylenol), Serum: <10 ug/mL — ABNORMAL LOW (ref 10–30)

## 2022-07-19 MED ORDER — CARVEDILOL 12.5 MG PO TABS
12.5000 mg | ORAL_TABLET | Freq: Once | ORAL | Status: AC
Start: 2022-07-19 — End: 2022-07-19
  Administered 2022-07-19: 12.5 mg via ORAL
  Filled 2022-07-19: qty 1

## 2022-07-19 MED ORDER — SODIUM CHLORIDE 0.9 % IV BOLUS
500.0000 mL | Freq: Once | INTRAVENOUS | Status: AC
  Administered 2022-07-19: 500 mL via INTRAVENOUS

## 2022-07-19 MED ORDER — ACETAMINOPHEN 325 MG PO TABS
650.0000 mg | ORAL_TABLET | Freq: Once | ORAL | Status: AC
  Administered 2022-07-19: 650 mg via ORAL
  Filled 2022-07-19: qty 2

## 2022-07-19 NOTE — ED Notes (Signed)
Gave pt 2 juices and graham crackers
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
## 2022-07-19 NOTE — Discharge Instructions (Signed)
Follow up with your primary care provider.

## 2022-07-19 NOTE — Care Management (Signed)
Patient apparently does not have keys to apartment when [facility] picked her up they left the walker. Called and spoke to Sherry Baker, cousin POA [place]. She states normally she is sent home by taxi,and someone should be at the apartment to let her in . Messaged nursing with information

## 2022-07-19 NOTE — ED Notes (Signed)
Refusing lab draws. Refusing to change out of soiled clothing.

## 2022-07-19 NOTE — ED Notes (Signed)
  Patient still refusing lab work and uncooperative during assessment.

## 2022-07-19 NOTE — ED Notes (Signed)
Pt's cousin Crystal called so pt could be picked up, voicemail left.

## 2022-07-19 NOTE — ED Notes (Signed)
Pt was outside waiting area sitting and talking to another patient. Her blankets that were around her in wheelchair soaked in old smelly urine. RN gave her paper scrubs and instructed her to change and put her soiled wet clothes in plastic belongings bag. She agreed and went into restroom.

## 2022-07-19 NOTE — ED Notes (Signed)
Pt found sleeping in bathroom floor.

## 2022-07-19 NOTE — ED Notes (Signed)
Pt's contacts are unable to pick pt up. Pt does no have walker with them. Pt does not have keys to the place where they are staying and does not have contact information for them.
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
## 2022-10-25 ENCOUNTER — Inpatient Hospital Stay (HOSPITAL_COMMUNITY)
Admission: EM | Admit: 2022-10-25 | Discharge: 2022-11-08 | DRG: 870 | Disposition: A | Discharge status: Home / Self Care | Payer: Medicaid Other | Attending: Family Medicine | Admitting: Family Medicine

## 2022-10-25 ENCOUNTER — Emergency Department (HOSPITAL_COMMUNITY): Payer: Medicaid Other

## 2022-10-25 DIAGNOSIS — Z653 Problems related to other legal circumstances: Secondary | ICD-10-CM

## 2022-10-25 DIAGNOSIS — K7030 Alcoholic cirrhosis of liver without ascites: Secondary | ICD-10-CM | POA: Diagnosis present

## 2022-10-25 DIAGNOSIS — Z79899 Other long term (current) drug therapy: Secondary | ICD-10-CM

## 2022-10-25 DIAGNOSIS — J9601 Acute respiratory failure with hypoxia: Secondary | ICD-10-CM | POA: Diagnosis present

## 2022-10-25 DIAGNOSIS — F1410 Cocaine abuse, uncomplicated: Secondary | ICD-10-CM | POA: Diagnosis present

## 2022-10-25 DIAGNOSIS — N182 Chronic kidney disease, stage 2 (mild): Secondary | ICD-10-CM | POA: Diagnosis present

## 2022-10-25 DIAGNOSIS — N179 Acute kidney failure, unspecified: Secondary | ICD-10-CM | POA: Diagnosis present

## 2022-10-25 DIAGNOSIS — R339 Retention of urine, unspecified: Secondary | ICD-10-CM | POA: Diagnosis not present

## 2022-10-25 DIAGNOSIS — R451 Restlessness and agitation: Secondary | ICD-10-CM | POA: Diagnosis not present

## 2022-10-25 DIAGNOSIS — L8990 Pressure ulcer of unspecified site, unspecified stage: Secondary | ICD-10-CM | POA: Diagnosis present

## 2022-10-25 DIAGNOSIS — Z720 Tobacco use: Secondary | ICD-10-CM | POA: Diagnosis present

## 2022-10-25 DIAGNOSIS — R0902 Hypoxemia: Secondary | ICD-10-CM

## 2022-10-25 DIAGNOSIS — F419 Anxiety disorder, unspecified: Secondary | ICD-10-CM | POA: Diagnosis present

## 2022-10-25 DIAGNOSIS — N390 Urinary tract infection, site not specified: Secondary | ICD-10-CM | POA: Diagnosis present

## 2022-10-25 DIAGNOSIS — G928 Other toxic encephalopathy: Secondary | ICD-10-CM | POA: Diagnosis present

## 2022-10-25 DIAGNOSIS — F05 Delirium due to known physiological condition: Secondary | ICD-10-CM | POA: Diagnosis not present

## 2022-10-25 DIAGNOSIS — J45901 Unspecified asthma with (acute) exacerbation: Secondary | ICD-10-CM | POA: Diagnosis present

## 2022-10-25 DIAGNOSIS — Z880 Allergy status to penicillin: Secondary | ICD-10-CM

## 2022-10-25 DIAGNOSIS — Z85048 Personal history of other malignant neoplasm of rectum, rectosigmoid junction, and anus: Secondary | ICD-10-CM

## 2022-10-25 DIAGNOSIS — B9689 Other specified bacterial agents as the cause of diseases classified elsewhere: Secondary | ICD-10-CM | POA: Diagnosis present

## 2022-10-25 DIAGNOSIS — Z781 Physical restraint status: Secondary | ICD-10-CM

## 2022-10-25 DIAGNOSIS — W050XXA Fall from non-moving wheelchair, initial encounter: Secondary | ICD-10-CM | POA: Diagnosis present

## 2022-10-25 DIAGNOSIS — L89021 Pressure ulcer of left elbow, stage 1: Secondary | ICD-10-CM | POA: Diagnosis not present

## 2022-10-25 DIAGNOSIS — J13 Pneumonia due to Streptococcus pneumoniae: Secondary | ICD-10-CM | POA: Diagnosis present

## 2022-10-25 DIAGNOSIS — Y902 Blood alcohol level of 40-59 mg/100 ml: Secondary | ICD-10-CM | POA: Diagnosis present

## 2022-10-25 DIAGNOSIS — E039 Hypothyroidism, unspecified: Secondary | ICD-10-CM | POA: Diagnosis present

## 2022-10-25 DIAGNOSIS — E46 Unspecified protein-calorie malnutrition: Secondary | ICD-10-CM | POA: Diagnosis present

## 2022-10-25 DIAGNOSIS — Y998 Other external cause status: Secondary | ICD-10-CM

## 2022-10-25 DIAGNOSIS — Z1152 Encounter for screening for COVID-19: Secondary | ICD-10-CM

## 2022-10-25 DIAGNOSIS — Y92238 Other place in hospital as the place of occurrence of the external cause: Secondary | ICD-10-CM | POA: Diagnosis present

## 2022-10-25 DIAGNOSIS — L988 Other specified disorders of the skin and subcutaneous tissue: Secondary | ICD-10-CM | POA: Diagnosis present

## 2022-10-25 DIAGNOSIS — S01112A Laceration without foreign body of left eyelid and periocular area, initial encounter: Secondary | ICD-10-CM | POA: Diagnosis present

## 2022-10-25 DIAGNOSIS — J690 Pneumonitis due to inhalation of food and vomit: Secondary | ICD-10-CM | POA: Diagnosis present

## 2022-10-25 DIAGNOSIS — I129 Hypertensive chronic kidney disease with stage 1 through stage 4 chronic kidney disease, or unspecified chronic kidney disease: Secondary | ICD-10-CM | POA: Diagnosis present

## 2022-10-25 DIAGNOSIS — L89011 Pressure ulcer of right elbow, stage 1: Secondary | ICD-10-CM | POA: Diagnosis not present

## 2022-10-25 DIAGNOSIS — Z7951 Long term (current) use of inhaled steroids: Secondary | ICD-10-CM

## 2022-10-25 DIAGNOSIS — F319 Bipolar disorder, unspecified: Secondary | ICD-10-CM | POA: Diagnosis present

## 2022-10-25 DIAGNOSIS — Z8614 Personal history of Methicillin resistant Staphylococcus aureus infection: Secondary | ICD-10-CM

## 2022-10-25 DIAGNOSIS — F1510 Other stimulant abuse, uncomplicated: Secondary | ICD-10-CM | POA: Diagnosis present

## 2022-10-25 DIAGNOSIS — F17210 Nicotine dependence, cigarettes, uncomplicated: Secondary | ICD-10-CM | POA: Diagnosis present

## 2022-10-25 DIAGNOSIS — R001 Bradycardia, unspecified: Secondary | ICD-10-CM | POA: Diagnosis not present

## 2022-10-25 DIAGNOSIS — Z8616 Personal history of COVID-19: Secondary | ICD-10-CM

## 2022-10-25 DIAGNOSIS — I10 Essential (primary) hypertension: Secondary | ICD-10-CM

## 2022-10-25 DIAGNOSIS — J441 Chronic obstructive pulmonary disease with (acute) exacerbation: Secondary | ICD-10-CM | POA: Diagnosis present

## 2022-10-25 DIAGNOSIS — Z932 Ileostomy status: Secondary | ICD-10-CM

## 2022-10-25 DIAGNOSIS — F10129 Alcohol abuse with intoxication, unspecified: Secondary | ICD-10-CM | POA: Diagnosis present

## 2022-10-25 DIAGNOSIS — G9341 Metabolic encephalopathy: Secondary | ICD-10-CM | POA: Diagnosis present

## 2022-10-25 DIAGNOSIS — A419 Sepsis, unspecified organism: Principal | ICD-10-CM | POA: Diagnosis present

## 2022-10-25 DIAGNOSIS — Z22322 Carrier or suspected carrier of Methicillin resistant Staphylococcus aureus: Secondary | ICD-10-CM

## 2022-10-25 DIAGNOSIS — Z933 Colostomy status: Secondary | ICD-10-CM

## 2022-10-25 DIAGNOSIS — I2489 Other forms of acute ischemic heart disease: Secondary | ICD-10-CM | POA: Diagnosis present

## 2022-10-25 DIAGNOSIS — Z91148 Patient's other noncompliance with medication regimen for other reason: Secondary | ICD-10-CM

## 2022-10-25 DIAGNOSIS — R4182 Altered mental status, unspecified: Principal | ICD-10-CM

## 2022-10-25 DIAGNOSIS — E871 Hypo-osmolality and hyponatremia: Secondary | ICD-10-CM | POA: Diagnosis present

## 2022-10-25 DIAGNOSIS — L89154 Pressure ulcer of sacral region, stage 4: Secondary | ICD-10-CM | POA: Diagnosis present

## 2022-10-25 DIAGNOSIS — S0003XA Contusion of scalp, initial encounter: Secondary | ICD-10-CM | POA: Diagnosis present

## 2022-10-25 LAB — COMPREHENSIVE METABOLIC PANEL
ALT: 11 U/L (ref 0–44)
AST: 23 U/L (ref 15–41)
Albumin: 3.2 g/dL — ABNORMAL LOW (ref 3.5–5.0)
Alkaline Phosphatase: 77 U/L (ref 38–126)
Anion gap: 17 — ABNORMAL HIGH (ref 5–15)
BUN: 18 mg/dL (ref 6–20)
CO2: 14 mmol/L — ABNORMAL LOW (ref 22–32)
Calcium: 8.9 mg/dL (ref 8.9–10.3)
Chloride: 105 mmol/L (ref 98–111)
Creatinine, Ser: 1.81 mg/dL — ABNORMAL HIGH (ref 0.44–1.00)
GFR, Estimated: 33 mL/min — ABNORMAL LOW (ref >60)
Glucose, Bld: 81 mg/dL (ref 70–99)
Potassium: 3.6 mmol/L (ref 3.5–5.1)
Sodium: 136 mmol/L (ref 135–145)
Total Bilirubin: 0.1 mg/dL — ABNORMAL LOW (ref 0.3–1.2)
Total Protein: 8.1 g/dL (ref 6.5–8.1)

## 2022-10-25 LAB — CBC WITH DIFFERENTIAL/PLATELET
Abs Immature Granulocytes: 0.04 10*3/uL (ref 0.00–0.07)
Basophils Absolute: 0 10*3/uL (ref 0.0–0.1)
Basophils Relative: 0 %
Eosinophils Absolute: 0.1 10*3/uL (ref 0.0–0.5)
Eosinophils Relative: 2 %
HCT: 41.7 % (ref 36.0–46.0)
Hemoglobin: 13 g/dL (ref 12.0–15.0)
Immature Granulocytes: 1 %
Lymphocytes Relative: 14 %
Lymphs Abs: 0.8 10*3/uL (ref 0.7–4.0)
MCH: 28.1 pg (ref 26.0–34.0)
MCHC: 31.2 g/dL (ref 30.0–36.0)
MCV: 90.3 fL (ref 80.0–100.0)
Monocytes Absolute: 0.4 10*3/uL (ref 0.1–1.0)
Monocytes Relative: 7 %
Neutro Abs: 4.4 10*3/uL (ref 1.7–7.7)
Neutrophils Relative %: 76 %
Platelets: 233 10*3/uL (ref 150–400)
RBC: 4.62 MIL/uL (ref 3.87–5.11)
RDW: 16.3 % — ABNORMAL HIGH (ref 11.5–15.5)
WBC: 5.9 10*3/uL (ref 4.0–10.5)
nRBC: 0 % (ref 0.0–0.2)

## 2022-10-25 LAB — ETHANOL: Alcohol, Ethyl (B): 48 mg/dL — ABNORMAL HIGH (ref <10)
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
## 2022-10-26 ENCOUNTER — Emergency Department (HOSPITAL_COMMUNITY): Payer: Medicaid Other

## 2022-10-26 DIAGNOSIS — Z8616 Personal history of COVID-19: Secondary | ICD-10-CM | POA: Diagnosis not present

## 2022-10-26 DIAGNOSIS — R4182 Altered mental status, unspecified: Secondary | ICD-10-CM | POA: Diagnosis not present

## 2022-10-26 DIAGNOSIS — L89154 Pressure ulcer of sacral region, stage 4: Secondary | ICD-10-CM | POA: Diagnosis present

## 2022-10-26 DIAGNOSIS — J13 Pneumonia due to Streptococcus pneumoniae: Secondary | ICD-10-CM | POA: Diagnosis present

## 2022-10-26 DIAGNOSIS — F17210 Nicotine dependence, cigarettes, uncomplicated: Secondary | ICD-10-CM | POA: Diagnosis present

## 2022-10-26 DIAGNOSIS — N390 Urinary tract infection, site not specified: Secondary | ICD-10-CM | POA: Diagnosis present

## 2022-10-26 DIAGNOSIS — E871 Hypo-osmolality and hyponatremia: Secondary | ICD-10-CM | POA: Diagnosis present

## 2022-10-26 DIAGNOSIS — F10129 Alcohol abuse with intoxication, unspecified: Secondary | ICD-10-CM | POA: Diagnosis present

## 2022-10-26 DIAGNOSIS — Z1152 Encounter for screening for COVID-19: Secondary | ICD-10-CM | POA: Diagnosis not present

## 2022-10-26 DIAGNOSIS — Y902 Blood alcohol level of 40-59 mg/100 ml: Secondary | ICD-10-CM | POA: Diagnosis present

## 2022-10-26 DIAGNOSIS — E46 Unspecified protein-calorie malnutrition: Secondary | ICD-10-CM | POA: Diagnosis present

## 2022-10-26 DIAGNOSIS — F1920 Other psychoactive substance dependence, uncomplicated: Secondary | ICD-10-CM | POA: Diagnosis not present

## 2022-10-26 DIAGNOSIS — F319 Bipolar disorder, unspecified: Secondary | ICD-10-CM | POA: Diagnosis present

## 2022-10-26 DIAGNOSIS — J9601 Acute respiratory failure with hypoxia: Secondary | ICD-10-CM | POA: Diagnosis present

## 2022-10-26 DIAGNOSIS — G928 Other toxic encephalopathy: Secondary | ICD-10-CM | POA: Diagnosis present

## 2022-10-26 DIAGNOSIS — E039 Hypothyroidism, unspecified: Secondary | ICD-10-CM | POA: Diagnosis present

## 2022-10-26 DIAGNOSIS — J441 Chronic obstructive pulmonary disease with (acute) exacerbation: Secondary | ICD-10-CM | POA: Diagnosis present

## 2022-10-26 DIAGNOSIS — Y998 Other external cause status: Secondary | ICD-10-CM | POA: Diagnosis not present

## 2022-10-26 DIAGNOSIS — N179 Acute kidney failure, unspecified: Secondary | ICD-10-CM | POA: Diagnosis present

## 2022-10-26 DIAGNOSIS — W050XXA Fall from non-moving wheelchair, initial encounter: Secondary | ICD-10-CM | POA: Diagnosis present

## 2022-10-26 DIAGNOSIS — J690 Pneumonitis due to inhalation of food and vomit: Secondary | ICD-10-CM | POA: Diagnosis present

## 2022-10-26 DIAGNOSIS — I129 Hypertensive chronic kidney disease with stage 1 through stage 4 chronic kidney disease, or unspecified chronic kidney disease: Secondary | ICD-10-CM | POA: Diagnosis present

## 2022-10-26 DIAGNOSIS — F05 Delirium due to known physiological condition: Secondary | ICD-10-CM | POA: Diagnosis not present

## 2022-10-26 DIAGNOSIS — R0902 Hypoxemia: Secondary | ICD-10-CM | POA: Diagnosis present

## 2022-10-26 DIAGNOSIS — G9341 Metabolic encephalopathy: Secondary | ICD-10-CM | POA: Diagnosis not present

## 2022-10-26 DIAGNOSIS — A419 Sepsis, unspecified organism: Secondary | ICD-10-CM | POA: Diagnosis present

## 2022-10-26 DIAGNOSIS — F1410 Cocaine abuse, uncomplicated: Secondary | ICD-10-CM | POA: Diagnosis present

## 2022-10-26 DIAGNOSIS — Y92238 Other place in hospital as the place of occurrence of the external cause: Secondary | ICD-10-CM | POA: Diagnosis present

## 2022-10-26 DIAGNOSIS — J45901 Unspecified asthma with (acute) exacerbation: Secondary | ICD-10-CM | POA: Diagnosis present

## 2022-10-26 DIAGNOSIS — I2489 Other forms of acute ischemic heart disease: Secondary | ICD-10-CM | POA: Diagnosis present

## 2022-10-26 DIAGNOSIS — K7030 Alcoholic cirrhosis of liver without ascites: Secondary | ICD-10-CM | POA: Diagnosis present

## 2022-10-26 LAB — I-STAT ARTERIAL BLOOD GAS, ED
Acid-base deficit: 7 mmol/L — ABNORMAL HIGH (ref 0.0–2.0)
Acid-base deficit: 7 mmol/L — ABNORMAL HIGH (ref 0.0–2.0)
Bicarbonate: 18.1 mmol/L — ABNORMAL LOW (ref 20.0–28.0)
Bicarbonate: 19.5 mmol/L — ABNORMAL LOW (ref 20.0–28.0)
Calcium, Ion: 1.24 mmol/L (ref 1.15–1.40)
Calcium, Ion: 1.26 mmol/L (ref 1.15–1.40)
HCT: 39 % (ref 36.0–46.0)
HCT: 37 % (ref 36.0–46.0)
Hemoglobin: 13.3 g/dL (ref 12.0–15.0)
Hemoglobin: 12.6 g/dL (ref 12.0–15.0)
O2 Saturation: 95 %
O2 Saturation: 92 %
Patient temperature: 98.3
Patient temperature: 101.9
Potassium: 3.9 mmol/L (ref 3.5–5.1)
Potassium: 3.4 mmol/L — ABNORMAL LOW (ref 3.5–5.1)
Sodium: 141 mmol/L (ref 135–145)
Sodium: 141 mmol/L (ref 135–145)
TCO2: 19 mmol/L — ABNORMAL LOW (ref 22–32)
TCO2: 21 mmol/L — ABNORMAL LOW (ref 22–32)
pCO2 arterial: 34.8 mmHg (ref 32–48)
pCO2 arterial: 43.8 mmHg (ref 32–48)
pH, Arterial: 7.323 — ABNORMAL LOW (ref 7.35–7.45)
pH, Arterial: 7.265 — ABNORMAL LOW (ref 7.35–7.45)
pO2, Arterial: 82 mmHg — ABNORMAL LOW (ref 83–108)
pO2, Arterial: 81 mmHg — ABNORMAL LOW (ref 83–108)

## 2022-10-26 LAB — GLUCOSE, CAPILLARY
Glucose-Capillary: 75 mg/dL (ref 70–99)
Glucose-Capillary: 91 mg/dL (ref 70–99)
Glucose-Capillary: 91 mg/dL (ref 70–99)

## 2022-10-26 LAB — HEPATIC FUNCTION PANEL
ALT: 7 U/L (ref 0–44)
AST: 23 U/L (ref 15–41)
Albumin: 2.1 g/dL — ABNORMAL LOW (ref 3.5–5.0)
Alkaline Phosphatase: 58 U/L (ref 38–126)
Bilirubin, Direct: <0.1 mg/dL (ref 0.0–0.2)
Total Bilirubin: 0.4 mg/dL (ref 0.3–1.2)
Total Protein: 5.9 g/dL — ABNORMAL LOW (ref 6.5–8.1)

## 2022-10-26 LAB — STREP PNEUMONIAE URINARY ANTIGEN: Strep Pneumo Urinary Antigen: POSITIVE — AB

## 2022-10-26 LAB — RAPID URINE DRUG SCREEN, HOSP PERFORMED
Amphetamines: POSITIVE — AB
Barbiturates: POSITIVE — AB
Benzodiazepines: NOT DETECTED
Cocaine: POSITIVE — AB
Opiates: NOT DETECTED
Tetrahydrocannabinol: NOT DETECTED

## 2022-10-26 LAB — URINALYSIS, ROUTINE W REFLEX MICROSCOPIC
Bilirubin Urine: NEGATIVE
Glucose, UA: NEGATIVE mg/dL
Ketones, ur: NEGATIVE mg/dL
Nitrite: POSITIVE — AB
Protein, ur: >=300 mg/dL — AB
Specific Gravity, Urine: 1.01 (ref 1.005–1.030)
WBC, UA: >50 WBC/hpf — ABNORMAL HIGH (ref 0–5)
pH: 5 (ref 5.0–8.0)

## 2022-10-26 LAB — TROPONIN I (HIGH SENSITIVITY)
Troponin I (High Sensitivity): 31 ng/L — ABNORMAL HIGH (ref <18)
Troponin I (High Sensitivity): 239 ng/L (ref <18)

## 2022-10-26 LAB — CBC
HCT: 38 % (ref 36.0–46.0)
Hemoglobin: 11.8 g/dL — ABNORMAL LOW (ref 12.0–15.0)
MCH: 27.8 pg (ref 26.0–34.0)
MCHC: 31.1 g/dL (ref 30.0–36.0)
MCV: 89.6 fL (ref 80.0–100.0)
Platelets: 167 10*3/uL (ref 150–400)
RBC: 4.24 MIL/uL (ref 3.87–5.11)
RDW: 16.3 % — ABNORMAL HIGH (ref 11.5–15.5)
WBC: 4.9 10*3/uL (ref 4.0–10.5)
nRBC: 0 % (ref 0.0–0.2)

## 2022-10-26 LAB — RESP PANEL BY RT-PCR (RSV, FLU A&B, COVID)  RVPGX2
Influenza A by PCR: NEGATIVE
Influenza B by PCR: NEGATIVE
Resp Syncytial Virus by PCR: NEGATIVE
SARS Coronavirus 2 by RT PCR: NEGATIVE

## 2022-10-26 LAB — ETHANOL: Alcohol, Ethyl (B): <10 mg/dL (ref <10)

## 2022-10-26 LAB — BASIC METABOLIC PANEL
Anion gap: 12 (ref 5–15)
BUN: 20 mg/dL (ref 6–20)
CO2: 17 mmol/L — ABNORMAL LOW (ref 22–32)
Calcium: 8.1 mg/dL — ABNORMAL LOW (ref 8.9–10.3)
Chloride: 110 mmol/L (ref 98–111)
Creatinine, Ser: 1.56 mg/dL — ABNORMAL HIGH (ref 0.44–1.00)
GFR, Estimated: 39 mL/min — ABNORMAL LOW (ref >60)
Glucose, Bld: 76 mg/dL (ref 70–99)
Potassium: 5.1 mmol/L (ref 3.5–5.1)
Sodium: 139 mmol/L (ref 135–145)

## 2022-10-26 LAB — SALICYLATE LEVEL: Salicylate Lvl: <7 mg/dL — ABNORMAL LOW (ref 7.0–30.0)

## 2022-10-26 LAB — MRSA NEXT GEN BY PCR, NASAL: MRSA by PCR Next Gen: DETECTED — AB

## 2022-10-26 LAB — PROTIME-INR
INR: 1.1 (ref 0.8–1.2)
Prothrombin Time: 14.4 seconds (ref 11.4–15.2)

## 2022-10-26 LAB — PHOSPHORUS: Phosphorus: 3.7 mg/dL (ref 2.5–4.6)

## 2022-10-26 LAB — PROCALCITONIN: Procalcitonin: 5.39 ng/mL

## 2022-10-26 LAB — ACETAMINOPHEN LEVEL: Acetaminophen (Tylenol), Serum: <10 ug/mL — ABNORMAL LOW (ref 10–30)

## 2022-10-26 MED ORDER — PHENOBARBITAL 32.4 MG PO TABS
32.4000 mg | ORAL_TABLET | Freq: Three times a day (TID) | ORAL | Status: AC
  Administered 2022-10-28 – 2022-10-30 (×6): 32.4 mg
  Filled 2022-10-26 (×6): qty 1

## 2022-10-26 MED ORDER — VANCOMYCIN VARIABLE DOSE PER UNSTABLE RENAL FUNCTION (PHARMACIST DOSING): Status: DC

## 2022-10-26 MED ORDER — PROPOFOL 1000 MG/100ML IV EMUL
0.0000 ug/kg/min | INTRAVENOUS | Status: DC
  Administered 2022-10-26: 15 ug/kg/min via INTRAVENOUS
  Administered 2022-10-26: 50 ug/kg/min via INTRAVENOUS
  Administered 2022-10-26: 5 ug/kg/min via INTRAVENOUS
  Administered 2022-10-26: 45 ug/kg/min via INTRAVENOUS
  Administered 2022-10-26: 50 ug/kg/min via INTRAVENOUS
  Administered 2022-10-26: 35 ug/kg/min via INTRAVENOUS
  Administered 2022-10-27: 40 ug/kg/min via INTRAVENOUS
  Administered 2022-10-27: 30 ug/kg/min via INTRAVENOUS
  Administered 2022-10-28: 20 ug/kg/min via INTRAVENOUS
  Filled 2022-10-26 (×7): qty 100

## 2022-10-26 MED ORDER — NALOXONE HCL 0.4 MG/ML IJ SOLN
0.4000 mg | Freq: Once | INTRAMUSCULAR | Status: AC
  Administered 2022-10-26: 0.4 mg via INTRAVENOUS
  Filled 2022-10-26: qty 1

## 2022-10-26 MED ORDER — SODIUM CHLORIDE 0.9 % IV SOLN: 3.0000 g | Freq: Two times a day (BID) | INTRAVENOUS | Status: DC

## 2022-10-26 MED ORDER — LORAZEPAM 2 MG/ML IJ SOLN
1.0000 mg | INTRAMUSCULAR | Status: DC | PRN
  Administered 2022-10-29: 1 mg via INTRAVENOUS
  Filled 2022-10-26: qty 1

## 2022-10-26 MED ORDER — CHLORHEXIDINE GLUCONATE CLOTH 2 % EX PADS
6.0000 | MEDICATED_PAD | Freq: Every day | CUTANEOUS | Status: DC
  Administered 2022-10-26 – 2022-11-07 (×13): 6 via TOPICAL

## 2022-10-26 MED ORDER — LACTATED RINGERS IV BOLUS (SEPSIS): 500.0000 mL | Freq: Once | INTRAVENOUS | Status: DC

## 2022-10-26 MED ORDER — PIPERACILLIN-TAZOBACTAM 3.375 G IVPB 30 MIN
3.3750 g | Freq: Once | INTRAVENOUS | Status: AC
  Administered 2022-10-26: 3.375 g via INTRAVENOUS
  Filled 2022-10-26: qty 50

## 2022-10-26 MED ORDER — ADULT MULTIVITAMIN W/MINERALS CH
1.0000 | ORAL_TABLET | Freq: Every day | ORAL | Status: DC
  Administered 2022-10-26 – 2022-11-08 (×14): 1
  Filled 2022-10-26 (×14): qty 1

## 2022-10-26 MED ORDER — SODIUM CHLORIDE 0.9 % IV SOLN: 3.0000 g | Freq: Four times a day (QID) | INTRAVENOUS | Status: DC

## 2022-10-26 MED ORDER — METRONIDAZOLE 500 MG/100ML IV SOLN: 500.0000 mg | Freq: Once | INTRAVENOUS | Status: DC

## 2022-10-26 MED ORDER — LACTATED RINGERS IV BOLUS (SEPSIS)
1000.0000 mL | Freq: Once | INTRAVENOUS | Status: AC
  Administered 2022-10-26: 1000 mL via INTRAVENOUS

## 2022-10-26 MED ORDER — LACTATED RINGERS IV BOLUS (SEPSIS): 250.0000 mL | Freq: Once | INTRAVENOUS | Status: DC

## 2022-10-26 MED ORDER — ROCURONIUM BROMIDE 50 MG/5ML IV SOLN
70.0000 mg | Freq: Once | INTRAVENOUS | Status: AC
  Administered 2022-10-26: 70 mg via INTRAVENOUS
  Filled 2022-10-26: qty 7

## 2022-10-26 MED ORDER — ETOMIDATE 2 MG/ML IV SOLN
20.0000 mg | Freq: Once | INTRAVENOUS | Status: AC
  Administered 2022-10-26: 20 mg via INTRAVENOUS
  Filled 2022-10-26: qty 10

## 2022-10-26 MED ORDER — PIPERACILLIN-TAZOBACTAM 3.375 G IVPB
3.3750 g | Freq: Three times a day (TID) | INTRAVENOUS | Status: DC
  Administered 2022-10-26 – 2022-10-28 (×5): 3.375 g via INTRAVENOUS
  Filled 2022-10-26 (×5): qty 50

## 2022-10-26 MED ORDER — LACTATED RINGERS IV BOLUS
1000.0000 mL | Freq: Once | INTRAVENOUS | Status: AC
  Administered 2022-10-26: 1000 mL via INTRAVENOUS

## 2022-10-26 MED ORDER — FAMOTIDINE 20 MG PO TABS
20.0000 mg | ORAL_TABLET | Freq: Two times a day (BID) | ORAL | Status: DC
  Administered 2022-10-26 – 2022-10-30 (×8): 20 mg
  Filled 2022-10-26 (×9): qty 1

## 2022-10-26 MED ORDER — VANCOMYCIN HCL IN DEXTROSE 1-5 GM/200ML-% IV SOLN: 1000.0000 mg | Freq: Once | INTRAVENOUS | Status: DC

## 2022-10-26 MED ORDER — DOCUSATE SODIUM 100 MG PO CAPS: 100.0000 mg | ORAL_CAPSULE | Freq: Two times a day (BID) | ORAL | Status: DC | PRN

## 2022-10-26 MED ORDER — FENTANYL CITRATE PF 50 MCG/ML IJ SOSY
50.0000 ug | PREFILLED_SYRINGE | INTRAMUSCULAR | Status: AC | PRN
  Administered 2022-10-26 – 2022-10-27 (×3): 50 ug via INTRAVENOUS
  Filled 2022-10-26 (×3): qty 1

## 2022-10-26 MED ORDER — LORAZEPAM 2 MG/ML IJ SOLN
1.0000 mg | Freq: Once | INTRAMUSCULAR | Status: AC
  Administered 2022-10-26: 1 mg via INTRAVENOUS
  Filled 2022-10-26: qty 1

## 2022-10-26 MED ORDER — ORAL CARE MOUTH RINSE: 15.0000 mL | OROMUCOSAL | Status: DC | PRN

## 2022-10-26 MED ORDER — ORAL CARE MOUTH RINSE
15.0000 mL | OROMUCOSAL | Status: DC
  Administered 2022-10-26 – 2022-11-04 (×102): 15 mL via OROMUCOSAL

## 2022-10-26 MED ORDER — SODIUM CHLORIDE 0.9 % IV SOLN
500.0000 mg | INTRAVENOUS | Status: AC
  Administered 2022-10-26 – 2022-10-30 (×5): 500 mg via INTRAVENOUS
  Filled 2022-10-26 (×5): qty 5

## 2022-10-26 MED ORDER — VANCOMYCIN HCL IN DEXTROSE 1-5 GM/200ML-% IV SOLN
1000.0000 mg | Freq: Once | INTRAVENOUS | Status: AC
  Administered 2022-10-26: 1000 mg via INTRAVENOUS
  Filled 2022-10-26: qty 200

## 2022-10-26 MED ORDER — FENTANYL CITRATE PF 50 MCG/ML IJ SOSY
50.0000 ug | PREFILLED_SYRINGE | INTRAMUSCULAR | Status: DC | PRN
  Administered 2022-10-28 (×2): 50 ug via INTRAVENOUS
  Administered 2022-10-29: 100 ug via INTRAVENOUS
  Administered 2022-10-29: 50 ug via INTRAVENOUS
  Administered 2022-10-29: 100 ug via INTRAVENOUS
  Administered 2022-10-29: 50 ug via INTRAVENOUS
  Administered 2022-10-29 – 2022-10-30 (×7): 100 ug via INTRAVENOUS
  Filled 2022-10-26 (×2): qty 2
  Filled 2022-10-26 (×2): qty 1
  Filled 2022-10-26 (×2): qty 2
  Filled 2022-10-26 (×2): qty 1
  Filled 2022-10-26 (×5): qty 2

## 2022-10-26 MED ORDER — LACTATED RINGERS IV SOLN: INTRAVENOUS | Status: DC

## 2022-10-26 MED ORDER — ONDANSETRON HCL 4 MG/2ML IJ SOLN
4.0000 mg | Freq: Once | INTRAMUSCULAR | Status: DC
  Filled 2022-10-26: qty 2

## 2022-10-26 MED ORDER — POLYETHYLENE GLYCOL 3350 17 G PO PACK: 17.0000 g | PACK | Freq: Every day | ORAL | Status: DC | PRN

## 2022-10-26 MED ORDER — PHENOBARBITAL 32.4 MG PO TABS
64.8000 mg | ORAL_TABLET | Freq: Three times a day (TID) | ORAL | Status: AC
  Administered 2022-10-26 – 2022-10-28 (×6): 64.8 mg
  Filled 2022-10-26 (×6): qty 2

## 2022-10-26 MED ORDER — DEXTROSE IN LACTATED RINGERS 5 % IV SOLN: INTRAVENOUS | Status: AC

## 2022-10-26 MED ORDER — FOLIC ACID 1 MG PO TABS
1.0000 mg | ORAL_TABLET | Freq: Every day | ORAL | Status: DC
  Administered 2022-10-26 – 2022-11-08 (×14): 1 mg
  Filled 2022-10-26 (×15): qty 1

## 2022-10-26 MED ORDER — THIAMINE MONONITRATE 100 MG PO TABS
100.0000 mg | ORAL_TABLET | Freq: Every day | ORAL | Status: DC
  Administered 2022-10-26 – 2022-11-08 (×14): 100 mg
  Filled 2022-10-26 (×14): qty 1

## 2022-10-26 MED ORDER — SODIUM CHLORIDE 0.9 % IV BOLUS
1000.0000 mL | Freq: Once | INTRAVENOUS | Status: AC
  Administered 2022-10-26: 1000 mL via INTRAVENOUS

## 2022-10-26 MED ORDER — POTASSIUM CHLORIDE 20 MEQ PO PACK
40.0000 meq | PACK | Freq: Once | ORAL | Status: AC
  Administered 2022-10-26: 40 meq
  Filled 2022-10-26: qty 2

## 2022-10-26 MED ORDER — HEPARIN SODIUM (PORCINE) 5000 UNIT/ML IJ SOLN
5000.0000 [IU] | Freq: Three times a day (TID) | INTRAMUSCULAR | Status: DC
  Administered 2022-10-26 – 2022-11-08 (×39): 5000 [IU] via SUBCUTANEOUS
  Filled 2022-10-26 (×40): qty 1

## 2022-10-26 MED ORDER — IPRATROPIUM-ALBUTEROL 0.5-2.5 (3) MG/3ML IN SOLN
3.0000 mL | Freq: Four times a day (QID) | RESPIRATORY_TRACT | Status: AC
  Administered 2022-10-26 – 2022-10-27 (×4): 3 mL via RESPIRATORY_TRACT
  Filled 2022-10-26 (×4): qty 3

## 2022-10-26 NOTE — ED Notes (Signed)
Patient agitated. Patient hitting head on stretcher. [employer] with patient. Triage provider aware. Will get vitals once patient calms down
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
## 2022-10-26 NOTE — ED Notes (Addendum)
This RN assumed care of patient, pt sleeping in gurney at this time. NAD noted. Pt connected to purewick to obtain urine sample. Pt presents with laceration, discoloration and swelling ski above left eye.
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
## 2022-10-26 NOTE — Progress Notes (Signed)
Transported from ED to 3S28 w/out complications

## 2022-10-26 NOTE — ED Notes (Signed)
Soft restraints applied to pt bilateral wrists per PA

## 2022-10-26 NOTE — ED Provider Notes (Signed)
  Physical Exam  BP (!) 175/86   Pulse (!) 118   Temp 98.3 F (36.8 C)   Resp (!) 22   Ht '5\' 2"'$  (1.575 m)   Wt 50 kg   SpO2 93%   BMI 20.16 kg/m   Physical Exam  Procedures  .Critical Care  Performed by: Merrily Pew, MD Authorized by: Merrily Pew, MD   Critical care provider statement:    Critical care time (minutes):  30   Critical care was necessary to treat or prevent imminent or life-threatening deterioration of the following conditions:  Respiratory failure and CNS failure or compromise   Critical care was time spent personally by me on the following activities:  Development of treatment plan with patient or surrogate, discussions with consultants, evaluation of patient's response to treatment, examination of patient, ordering and review of laboratory studies, ordering and review of radiographic studies, ordering and performing treatments and interventions, pulse oximetry, re-evaluation of patient's condition and review of old charts   ED Course / MDM    Medical Decision Making Amount and/or Complexity of Data Reviewed Labs: ordered. Radiology: ordered.  Risk Prescription drug management.   Patient initially thought to be intact acutely intoxicated versus psychosis from psych theatric condition.  It was an event out in the waiting room where she may have aspirated.  Since then she became more combative and hypoxic she was started nonrebreather and given some Ativan.  She continued to decline to the point that she was hypoxic even on nonrebreather.  When she would wake up she would have significant agitation and thrashing about.  He was concerned that she was not protecting her airway so patient was intubated.  I was present for and supervised the whole procedure with PA.  See PA note for intubation details.  Already had his head CT without any obvious bleed.  On intubation she had purulent discharge from her trachea so concern for possible aspiration versus [facility]  pneumonia.  Also still consider DT's. Will treat for same.  Patient will be admitted to ICU on propofol w/ PRN fentanyl.    Merrily Pew, MD 10/26/22 [phone]

## 2022-10-26 NOTE — Progress Notes (Addendum)
Pharmacy Antibiotic Note  Sherry Baker is a 56 y.o. female admitted on 10/25/2022 with pneumonia.  Pharmacy has been consulted for vancomycin dosing. Patient with difficulty maintaining saturations this morning requiring intubation. Purulence noted. Patient with elevated Cr today. Will give a loading dose and add further doses pending renal function.  Plan: Vancomycin 1g once. Further doses to be scheduled pending renal function Will dose adjust Unasyn to 3g every 12 hours based on CrCl of 28 ml/min Will follow cultures and MRSA PCR to deescalate   Height: '5\' 2"'$  (157.5 cm) Weight: 50 kg (110 lb 3.7 oz) IBW/kg (Calculated) : 50.1  Temp (24hrs), Avg:98 F (36.7 C), Min:97.6 F (36.4 C), Max:98.3 F (36.8 C)  Recent Labs  Lab 10/25/22 2019  WBC 5.9  CREATININE 1.81*    Estimated Creatinine Clearance: 28 mL/min (A) (by C-G formula based on SCr of 1.81 mg/dL (H)).    Allergies  Allergen Reactions   Penicillins Hives and Swelling    Tolerated Unasyn, Rocephin Did it involve swelling of the face/tongue/throat, SOB, or low BP? Yes Did it involve sudden or severe rash/hives, skin peeling, or any reaction on the inside of your mouth or nose? No Did you need to seek medical attention at a hospital or doctor's office? No When did it last happen?  <10 years   If all above answers are "NO", may proceed with cephalosporin use.     Antimicrobials this admission: Unasyn 12/24 >>  Vanc 12/24 >>   Dose adjustments this admission: none  Microbiology results: 12/24 BCx: IP 12/24 MRSA PCR: IP  Thank you for allowing pharmacy to be a part of this patient's care.  Ursula Beath 10/26/2022 11:19 AM  ADDENDUM:  Pharmacy consulted to transition from Unasyn to [place].  Will start Zosyn 3.375 g every 8 hours.  Thank you for allowing pharmacy to participate in this patient's care.  Reatha Harps, PharmD PGY2 Pharmacy Resident 10/26/2022 12:43 PM Check [email or web address] for unit specific  pharmacy number

## 2022-10-26 NOTE — ED Notes (Signed)
Pt fighting vent, increased propofol titration Pt continued to desat' \\despite'$  increased sedation, secondary RN started to bag pt until RT is able to come to bedside

## 2022-10-26 NOTE — H&P (Signed)
NAME:  Sherry Baker, MRN:  [id number], DOB:  1966-08-07, LOS: 0 ADMISSION DATE:  10/25/2022, CONSULTATION DATE:  10/26/22 REFERRING MD:  Dr. Dayna Barker CHIEF COMPLAINT:  AMS   History of Present Illness:  Sherry Baker is a 56 year old woman with cirrhosis, polysubstance abuse, rectal cancer s/p iolostomy and bipolar disorder who presented to [facility] ER with altered mental status via EMS and [employer]. She had episodes of agitation in the waiting room. She was later found to be vomiting with emesis coming from her nose. She developed worsening respiratory status with hypoxia where she was placed on non-rebreather mask then required intubation.   PCCM called to admit patient.   Chest radiograph shows well positioned ET tube. New mid and lower left lung air space opacities superimposed on chronic interstitial thickening. Covid/Flu/Rsv negative.  Patient has prior positive MRSA screens this year. Urine culture from 06/2022 shows MDR klebsiella sensitive to zosyn.   Unable to obtain further history due to her being intubated.   Pertinent  Medical History   Past Medical History:  Diagnosis Date   Acquired hypothyroidism    Alcohol abuse    Allergy    PCNS swelling   Arthritis    Bipolar 1 disorder ([place])    Cancer ([place]) 01/21/2017   rectal cancer   Cancer ([place])    Chronic kidney disease    Chronic pain syndrome    Cirrhosis ([place])    Cirrhosis of liver (HCC)    Cocaine abuse ([place])    COVID-19 virus infection 06/23/2021   Delirium tremens ([place]) 06/22/2021   Depression    Genetic testing 03/24/2017   Ms. Venturini underwent genetic counseling and testing for hereditary cancer syndromes on 02/17/2017. Her results were negative for mutations in all 46 genes analyzed by [facility]'s 46-gene Common Hereditary Cancers Panel. Genes analyzed include: APC, ATM, AXIN2, BARD1, BMPR1A, BRCA1, BRCA2, BRIP1, CDH1, CDKN2A, CHEK2, CTNNA1, DICER1, EPCAM, GREM1, HOXB13, KIT, MEN1, MLH1, MSH2, MSH3, MSH6, MUTYH,  NBN,   HTN (hypertension)    Hypertension    Polysubstance abuse ([place])    Rectal cancer ([place])    Suicidal ideation 02/11/2014   Significant Hospital Events: Including procedures, antibiotic start and stop dates in addition to other pertinent events   12/24 intubated, admitted to ICU  Interim History / Subjective:  As above  Objective   Blood pressure (!) 175/86, pulse (!) 162, temperature 98.3 F (36.8 C), resp. rate 18, height _0  (1.575 m), weight 50 kg, SpO2 96 %.    Vent Mode: PRVC FiO2 (%):  [100 %] 100 % Set Rate:  [18 bmp] 18 bmp Vt Set:  [400 mL] 400 mL PEEP:  [5 cmH20] 5 cmH20 Plateau Pressure:  [21 cmH20] 21 cmH20   Intake/Output Summary (Last 24 hours) at 10/26/2022 1137 Last data filed at 10/26/2022 0753 Gross per 24 hour  Intake 1000 ml  Output --  Net 1000 ml   Filed Weights   10/26/22 1011  Weight: 50 kg    Examination: General: chronically ill appearing woman, intubated, sedated HENT: hematoma above left eye, moist mucous membranes, sclera anicteric Lungs: course breath sounds on left. No wheezing Cardiovascular: tachycardic, no murmurs Abdomen: soft, non-distended, BS+, ostomy in place with brown stool in bag Extremities: warm, no edema, scattered bruises and scrapes on knees Neuro: sedated, PERRL GU: foley in place  Resolved Hospital Problem list     Assessment & Plan:  Acute Hypoxemic Respiratory Failure Aspiration Pneumonia vs HCAP/CAP - Continue mechanical  ventilatory support, 6-8cc/kg tidal volume - Increase respiratory rate from 18-24 based on post intubation ABG - scheduled duonebs over next 24 hours - send tracheal aspirate for respiratory culture - check urine legionella and strep pna ag - Start vancomycin, zosyn and azithromycin, de-escalate based on culture data - check procal - RASS goal -1, propofol drip and fentanyl boluses ordered  Sepsis due to pneumonia vs UTI - fluid resuscitation per sepsis protocol - antibiotics as  above - hx of MDR klebsiella in urine 06/05/22  Acute Toxic Metabolic Encephalopathy Alcohol Abuse, risk for alcohol withdrawal Polysubstance Use - alcohol level of 48 on arrival in ED, undetectable today - start moderate dose phenobarbital taper - multivitamin, folic acid and thiamine per tube - follow up urine drug screen - check LFTs and ammonia  Acute Kidney Injury - fluid resuscitation per sepsis protocol - monitor UOP and serum Cr  Elevated Troponin - in setting of sepsis, will trend along with EKGs  Best Practice (right click and "Reselect all SmartList Selections" daily)   Diet/type: NPO DVT prophylaxis: prophylactic heparin  GI prophylaxis: H2B Lines: N/A Foley:  N/A Code Status:  full code Last date of multidisciplinary goals of care discussion [n/a]  Labs   CBC: Recent Labs  Lab 10/25/22 2019 10/26/22 1031  WBC 5.9  --   NEUTROABS 4.4  --   HGB 13.0 13.3  HCT 41.7 39.0  MCV 90.3  --   PLT 233  --     Basic Metabolic Panel: Recent Labs  Lab 10/25/22 2019 10/26/22 1031  NA 136 141  K 3.6 3.9  CL 105  --   CO2 14*  --   GLUCOSE 81  --   BUN 18  --   CREATININE 1.81*  --   CALCIUM 8.9  --    GFR: Estimated Creatinine Clearance: 28 mL/min (A) (by C-G formula based on SCr of 1.81 mg/dL (H)). Recent Labs  Lab 10/25/22 2019  WBC 5.9    Liver Function Tests: Recent Labs  Lab 10/25/22 2019  AST 23  ALT 11  ALKPHOS 77  BILITOT 0.1*  PROT 8.1  ALBUMIN 3.2*   No results for input(s): "LIPASE", "AMYLASE" in the last 168 hours. No results for input(s): "AMMONIA" in the last 168 hours.  ABG    Component Value Date/Time   PHART 7.323 (L) 10/26/2022 1031   PCO2ART 34.8 10/26/2022 1031   PO2ART 82 (L) 10/26/2022 1031   HCO3 18.1 (L) 10/26/2022 1031   TCO2 19 (L) 10/26/2022 1031   ACIDBASEDEF 7.0 (H) 10/26/2022 1031   O2SAT 95 10/26/2022 1031     Coagulation Profile: No results for input(s): "INR", "PROTIME" in the last 168  hours.  Cardiac Enzymes: No results for input(s): "CKTOTAL", "CKMB", "CKMBINDEX", "TROPONINI" in the last 168 hours.  HbA1C: Hgb A1c MFr Bld  Date/Time Value Ref Range Status  02/06/2022 06:34 AM 5.0 4.8 - 5.6 % Final    Comment:    (NOTE) Pre diabetes:          5.7%-6.4%  Diabetes:              >6.4%  Glycemic control for   <7.0% adults with diabetes   10/29/2021 07:27 AM 5.2 4.8 - 5.6 % Final    Comment:    (NOTE) Pre diabetes:          5.7%-6.4%  Diabetes:              >6.4%  Glycemic control for   <  7.0% adults with diabetes     CBG: No results for input(s): "GLUCAP" in the last 168 hours.  Review of Systems:   Unable to perform due to intubation and altered mentation  Past Medical History:  She,  has a past medical history of Acquired hypothyroidism, Alcohol abuse, Allergy, Arthritis, Bipolar 1 disorder ([place]), Cancer ([place]) (01/21/2017), Cancer ([place]), Chronic kidney disease, Chronic pain syndrome, Cirrhosis ([place]), Cirrhosis of liver ([place]), Cocaine abuse ([place]), COVID-19 virus infection (06/23/2021), Delirium tremens ([place]) (06/22/2021), Depression, Genetic testing (03/24/2017), HTN (hypertension), Hypertension, Polysubstance abuse ([place]), Rectal cancer ([place]), and Suicidal ideation (02/11/2014).   Surgical History:   Past Surgical History:  Procedure Laterality Date   ABDOMINAL PERINEAL BOWEL RESECTION N/A 06/18/2017   Procedure: ABDOMINAL PERINEAL RESECTION ERAS PATHWAY;  Surgeon: Leighton Ruff, MD;  Location: [facility];  Service: General;  Laterality: N/A;   BUBBLE STUDY  11/29/2021   Procedure: BUBBLE STUDY;  Surgeon: Berniece Salines, DO;  Location: [place];  Service: Cardiovascular;;   COLON SURGERY     COLONOSCOPY WITH PROPOFOL Left 01/21/2017   Procedure: COLONOSCOPY WITH PROPOFOL;  Surgeon: Otis Brace, MD;  Location: [facility];  Service: Gastroenterology;  Laterality: Left;   Colostomy     FRACTURE SURGERY     MANDIBLE FRACTURE SURGERY     TEE WITHOUT  CARDIOVERSION N/A 11/29/2021   Procedure: TRANSESOPHAGEAL ECHOCARDIOGRAM (TEE);  Surgeon: Berniece Salines, DO;  Location: [facility];  Service: Cardiovascular;  Laterality: N/A;     Social History:   reports that she has been smoking cigarettes. She started smoking about 46 years ago. She has a 44.00 pack-year smoking history. She has never used smokeless tobacco. She reports current alcohol use. She reports current drug use. Drugs: Cocaine and "Crack" cocaine.   Family History:  Her family history includes Cancer (age of onset: 20) in her mother; Cancer - Other in her maternal aunt.   Allergies Allergies  Allergen Reactions   Penicillins Hives and Swelling    Tolerated Unasyn, Rocephin Did it involve swelling of the face/tongue/throat, SOB, or low BP? Yes Did it involve sudden or severe rash/hives, skin peeling, or any reaction on the inside of your mouth or nose? No Did you need to seek medical attention at a hospital or doctor's office? No When did it last happen?  <10 years   If all above answers are "NO", may proceed with cephalosporin use.      Home Medications  Prior to Admission medications   Medication Sig Start Date End Date Taking? Authorizing Provider  acetaminophen (TYLENOL) 325 MG tablet Take 2 tablets (650 mg total) by mouth every 12 (twelve) hours. Patient not taking: Reported on 06/19/2022 12/13/21   Samella Parr, NP  albuterol (VENTOLIN HFA) 108 (90 Base) MCG/ACT inhaler Inhale 2 puffs into the lungs every 6 (six) hours as needed for wheezing or shortness of breath. Patient not taking: Reported on 06/19/2022 04/17/22   Shelly Coss, MD  Benzocaine (HURRCAINE) 20 % AERO Use as directed 1 Application in the mouth or throat 3 (three) times daily as needed for mouth pain. Patient not taking: Reported on 06/19/2022 04/17/22   Shelly Coss, MD  busPIRone (BUSPAR) 15 MG tablet Take 1 tablet (15 mg total) by mouth 2 (two) times daily. Patient not taking: Reported on  06/19/2022 04/17/22   Shelly Coss, MD  carvedilol (COREG) 6.25 MG tablet Take 1 tablet (6.25 mg total) by mouth 2 (two) times daily with a meal. Patient not taking: Reported on 06/19/2022 04/17/22  Shelly Coss, MD  folic acid (FOLVITE) 1 MG tablet Take 1 tablet (1 mg total) by mouth daily. Patient not taking: Reported on 06/19/2022 04/17/22   Shelly Coss, MD  magic mouthwash (nystatin, lidocaine, diphenhydrAMINE, alum & mag hydroxide) suspension Swish and spit 5 mLs 4 (four) times daily as needed for mouth pain. Patient not taking: Reported on 06/19/2022 04/22/22   Hans [place], NP  pantoprazole (PROTONIX) 40 MG tablet Take 1 tablet (40 mg total) by mouth 2 (two) times daily. Patient not taking: Reported on 06/19/2022 06/16/22 09/14/22  Leigh Aurora, DO  sertraline (ZOLOFT) 50 MG tablet Take 1 tablet (50 mg total) by mouth daily. Patient not taking: Reported on 06/19/2022 06/17/22 09/15/22  Leigh Aurora, DO  thiamine 100 MG tablet Take 1 tablet (100 mg total) by mouth daily. Patient not taking: Reported on 05/28/2022 04/17/22   Shelly Coss, MD  umeclidinium-vilanterol [facility] ELLIPTA) 62.5-25 MCG/ACT AEPB Inhale 1 puff into the lungs daily. Patient not taking: Reported on 06/19/2022 04/17/22   Shelly Coss, MD  ziprasidone (GEODON) 40 MG capsule Take 1 capsule (40 mg total) by mouth 2 (two) times daily with a meal. 11/06/11 01/09/12  Daleen Bo, MD     Critical care time:  50 minutes    Freda Jackson, MD [facility] Office: [phone]   See Amion for personal pager PCCM on call pager [phone] until 7pm. Please call Elink 7p-7a. [phone]

## 2022-10-26 NOTE — ED Notes (Signed)
Transfer of care report give to inpatient RN, leah.

## 2022-10-26 NOTE — ED Provider Notes (Signed)
Likely admission, AMS, ingestion?   Pending UDS, recheck alcohol, CIWA scores? Physical Exam  BP (!) 177/97   Pulse 94   Temp 98.2 F (36.8 C) (Axillary)   Resp 18   SpO2 100%   Physical Exam  Procedures  .Critical Care  Performed by: Dorothyann Peng, PA-C Authorized by: Dorothyann Peng, PA-C   Critical care provider statement:    Critical care time (minutes):  40   Critical care time was exclusive of:  Separately billable procedures and treating other patients   Critical care was necessary to treat or prevent imminent or life-threatening deterioration of the following conditions:  Respiratory failure   Critical care was time spent personally by me on the following activities:  Development of treatment plan with patient or surrogate, discussions with consultants, evaluation of patient's response to treatment, examination of patient, ordering and review of laboratory studies, ordering and review of radiographic studies, ordering and performing treatments and interventions, pulse oximetry, re-evaluation of patient's condition and review of old charts   Care discussed with: admitting provider   Procedure Name: Intubation Date/Time: 10/26/2022 11:10 AM  Performed by: Dorothyann Peng, PA-CPre-anesthesia Checklist: Patient identified, Emergency Drugs available, Suction available, Patient being monitored and Timeout performed Oxygen Delivery Method: Ambu bag Preoxygenation: Pre-oxygenation with 100% oxygen Induction Type: IV induction and Cricoid Pressure applied Ventilation: Mask ventilation without difficulty Laryngoscope Size: Glidescope Grade View: Grade II Tube size: 7.5 mm Number of attempts: 1 Airway Equipment and Method: Stylet and Video-laryngoscopy Placement Confirmation: ETT inserted through vocal cords under direct vision and Positive ETCO2 Secured at: 23 cm Tube secured with: ETT holder      ED Course / MDM    Medical Decision Making Amount and/or Complexity  of Data Reviewed Labs: ordered. Radiology: ordered.  Risk Prescription drug management.   Patient care taken over from previous provider.  Silverio Decamp, PA-C.  In short, patient presents emergency department via police custody with concerns over altered mental status.  Patient was reportedly being arrested but police noticed that the patient was not talking, was sleeping, breathing heavily, responding very minimally to verbal stimuli.  Patient is reportedly well-known for frequent intoxications.  Patient was in the waiting room for approximately 9 hours metabolizing when the triage PA was notified that the patient was becoming combative and vomiting.  Patient at that time reportedly began vomiting.  Concern for persistent altered mental status continued in context of witnessed intoxication and unknown substance ingestion.  Patient with history of chronic kidney disease, left lower quadrant colostomy, polysubstance abuse, alcoholic cirrhosis, DTs  Please see previous note for full details.  Patient's mental status seemed to worsen during my encounter.  Further labs were ordered and reviewed.  Urinalysis with positive nitrite, indicative of infection.  Anion gap 17.  Mildly elevated creatinine at 1.81.  Head CT performed last night was unremarkable.  Repeat chest x-ray performed due to concern for possible aspiration pneumonia shows chronic interstitial lung disease with stable aeration.  I ordered the patient 0.4 mg of Narcan which seemed to have no effect on her condition.  She continued to desat with oxygen levels in the upper 80s to not low 90s while on a nonrebreather mask.  The patient was intermittently arousable to verbal stimulation but combative.  Decision was made to intubate patient to protect airway as patient became more combative or agitated, was desatting on nonrebreather.  Patient would not tolerate BiPAP.  Please see my note for intubation details.  I requested  consultation with critical care team for admission for further evaluation and management of this critically ill patient.  I ordered and reviewed and personally interpreted repeat chest and x-ray.  Well-placed endotracheal tube and orogastric tubes noted.   Worsened lung aeration compared to the earlier study. New  airspace opacities developing in the left mid to lower lung, more  subtly in the right mid to lower lung, superimposed on chronic  interstitial thickening. Airspace opacity on the left may be due to  atelectasis, at least in part, supported by left lung volume loss     Ronny Bacon 10/26/22 1135    Mesner, Corene Cornea, MD 10/26/22 1140

## 2022-10-26 NOTE — ED Notes (Signed)
X-ray at bedside

## 2022-10-27 DIAGNOSIS — J9601 Acute respiratory failure with hypoxia: Secondary | ICD-10-CM | POA: Diagnosis not present

## 2022-10-27 LAB — BASIC METABOLIC PANEL
Anion gap: 11 (ref 5–15)
BUN: 21 mg/dL — ABNORMAL HIGH (ref 6–20)
CO2: 20 mmol/L — ABNORMAL LOW (ref 22–32)
Calcium: 8 mg/dL — ABNORMAL LOW (ref 8.9–10.3)
Chloride: 108 mmol/L (ref 98–111)
Creatinine, Ser: 1.67 mg/dL — ABNORMAL HIGH (ref 0.44–1.00)
GFR, Estimated: 36 mL/min — ABNORMAL LOW (ref >60)
Glucose, Bld: 73 mg/dL (ref 70–99)
Potassium: 4.7 mmol/L (ref 3.5–5.1)
Sodium: 139 mmol/L (ref 135–145)

## 2022-10-27 LAB — GLUCOSE, CAPILLARY
Glucose-Capillary: 80 mg/dL (ref 70–99)
Glucose-Capillary: 100 mg/dL — ABNORMAL HIGH (ref 70–99)
Glucose-Capillary: 100 mg/dL — ABNORMAL HIGH (ref 70–99)
Glucose-Capillary: 100 mg/dL — ABNORMAL HIGH (ref 70–99)
Glucose-Capillary: 104 mg/dL — ABNORMAL HIGH (ref 70–99)
Glucose-Capillary: 102 mg/dL — ABNORMAL HIGH (ref 70–99)

## 2022-10-27 LAB — POCT I-STAT 7, (LYTES, BLD GAS, ICA,H+H)
Acid-base deficit: 7 mmol/L — ABNORMAL HIGH (ref 0.0–2.0)
Bicarbonate: 17.7 mmol/L — ABNORMAL LOW (ref 20.0–28.0)
Calcium, Ion: 1.22 mmol/L (ref 1.15–1.40)
HCT: 31 % — ABNORMAL LOW (ref 36.0–46.0)
Hemoglobin: 10.5 g/dL — ABNORMAL LOW (ref 12.0–15.0)
O2 Saturation: 99 %
Patient temperature: 99.3
Potassium: 4.3 mmol/L (ref 3.5–5.1)
Sodium: 140 mmol/L (ref 135–145)
TCO2: 19 mmol/L — ABNORMAL LOW (ref 22–32)
pCO2 arterial: 32.1 mmHg (ref 32–48)
pH, Arterial: 7.351 (ref 7.35–7.45)
pO2, Arterial: 139 mmHg — ABNORMAL HIGH (ref 83–108)

## 2022-10-27 LAB — CBC
HCT: 38.5 % (ref 36.0–46.0)
Hemoglobin: 11.9 g/dL — ABNORMAL LOW (ref 12.0–15.0)
MCH: 28.5 pg (ref 26.0–34.0)
MCHC: 30.9 g/dL (ref 30.0–36.0)
MCV: 92.1 fL (ref 80.0–100.0)
Platelets: 147 10*3/uL — ABNORMAL LOW (ref 150–400)
RBC: 4.18 MIL/uL (ref 3.87–5.11)
RDW: 16.8 % — ABNORMAL HIGH (ref 11.5–15.5)
WBC: 13.5 10*3/uL — ABNORMAL HIGH (ref 4.0–10.5)
nRBC: 0 % (ref 0.0–0.2)

## 2022-10-27 LAB — MAGNESIUM
Magnesium: 1.8 mg/dL (ref 1.7–2.4)
Magnesium: 2 mg/dL (ref 1.7–2.4)
Magnesium: 2 mg/dL (ref 1.7–2.4)

## 2022-10-27 LAB — PHOSPHORUS
Phosphorus: 4.4 mg/dL (ref 2.5–4.6)
Phosphorus: 4 mg/dL (ref 2.5–4.6)

## 2022-10-27 LAB — VANCOMYCIN, RANDOM: Vancomycin Rm: 10 ug/mL

## 2022-10-27 LAB — OSMOLALITY: Osmolality: 305 mOsm/kg — ABNORMAL HIGH (ref 275–295)

## 2022-10-27 LAB — TRIGLYCERIDES: Triglycerides: 163 mg/dL — ABNORMAL HIGH (ref <150)

## 2022-10-27 MED ORDER — IPRATROPIUM-ALBUTEROL 0.5-2.5 (3) MG/3ML IN SOLN
3.0000 mL | Freq: Four times a day (QID) | RESPIRATORY_TRACT | Status: DC
  Administered 2022-10-27 – 2022-10-30 (×12): 3 mL via RESPIRATORY_TRACT
  Filled 2022-10-27 (×12): qty 3

## 2022-10-27 MED ORDER — VITAL HIGH PROTEIN PO LIQD
1000.0000 mL | ORAL | Status: DC
  Administered 2022-10-27: 1000 mL

## 2022-10-27 MED ORDER — PROSOURCE TF20 ENFIT COMPATIBL EN LIQD
60.0000 mL | Freq: Every day | ENTERAL | Status: DC
  Administered 2022-10-27 – 2022-10-28 (×2): 60 mL
  Filled 2022-10-27 (×2): qty 60

## 2022-10-27 NOTE — Progress Notes (Addendum)
NAME:  Sherry Baker, MRN:  [id number], DOB:  04-30-1966, LOS: 1 ADMISSION DATE:  10/25/2022, CONSULTATION DATE:  10/26/22 REFERRING MD:  Dr. Dayna Barker CHIEF COMPLAINT:  AMS   History of Present Illness:  Sherry Baker is a 56 year old woman with cirrhosis, polysubstance abuse, rectal cancer s/p iolostomy and bipolar disorder who presented to [facility] ER with altered mental status via EMS and [employer]. She had episodes of agitation in the waiting room. She was later found to be vomiting with emesis coming from her nose. She developed worsening respiratory status with hypoxia where she was placed on non-rebreather mask then required intubation.   PCCM called to admit patient.   Chest radiograph shows well positioned ET tube. New mid and lower left lung air space opacities superimposed on chronic interstitial thickening. Covid/Flu/Rsv negative.  Patient has prior positive MRSA screens this year. Urine culture from 06/2022 shows MDR klebsiella sensitive to zosyn.   Unable to obtain further history due to her being intubated.   Pertinent  Medical History   Past Medical History:  Diagnosis Date   Acquired hypothyroidism    Alcohol abuse    Allergy    PCNS swelling   Arthritis    Bipolar 1 disorder ([place])    Cancer ([place]) 01/21/2017   rectal cancer   Cancer ([place])    Chronic kidney disease    Chronic pain syndrome    Cirrhosis ([place])    Cirrhosis of liver (HCC)    Cocaine abuse ([place])    COVID-19 virus infection 06/23/2021   Delirium tremens ([place]) 06/22/2021   Depression    Genetic testing 03/24/2017   Ms. Spillman underwent genetic counseling and testing for hereditary cancer syndromes on 02/17/2017. Her results were negative for mutations in all 46 genes analyzed by [facility]'s 46-gene Common Hereditary Cancers Panel. Genes analyzed include: APC, ATM, AXIN2, BARD1, BMPR1A, BRCA1, BRCA2, BRIP1, CDH1, CDKN2A, CHEK2, CTNNA1, DICER1, EPCAM, GREM1, HOXB13, KIT, MEN1, MLH1, MSH2, MSH3, MSH6, MUTYH,  NBN,   HTN (hypertension)    Hypertension    Polysubstance abuse ([place])    Rectal cancer ([place])    Suicidal ideation 02/11/2014   Significant Hospital Events: Including procedures, antibiotic start and stop dates in addition to other pertinent events   12/24 intubated, admitted to ICU  Interim History / Subjective:   Strep pneumo urine ag is positive She remains intubated and sedated Nursing reports small hole in suprapubic region leaking clearish fluid  Objective   Blood pressure 101/67, pulse 95, temperature 99.3 F (37.4 C), temperature source Bladder, resp. rate (!) 24, height _0  (1.575 m), weight 51.7 kg, SpO2 100 %.    Vent Mode: PRVC FiO2 (%):  [40 %-100 %] 40 % Set Rate:  [18 bmp-24 bmp] 24 bmp Vt Set:  [400 mL] 400 mL PEEP:  [5 cmH20] 5 cmH20 Plateau Pressure:  [11 cmH20-21 cmH20] 12 cmH20   Intake/Output Summary (Last 24 hours) at 10/27/2022 0742 Last data filed at 10/27/2022 0600 Gross per 24 hour  Intake 4599.21 ml  Output 615 ml  Net 3984.21 ml   Filed Weights   10/26/22 1011 10/27/22 0500  Weight: 50 kg 51.7 kg    Examination: General: chronically ill appearing woman, intubated, sedated HENT: hematoma above left eye, moist mucous membranes, sclera anicteric Lungs: course breath sounds. No wheezing Cardiovascular: tachycardic, no murmurs Abdomen: soft, non-distended, BS+, ostomy in place with brown stool in bag Extremities: warm, no edema, scattered bruises and scrapes on knees Neuro: sedated, PERRL  GU: foley in place  Resolved Hospital Problem list     Assessment & Plan:  Acute Hypoxemic Respiratory Failure Aspiration Pneumonia vs HCAP/CAP - Continue mechanical ventilatory support, 6-8cc/kg tidal volume - scheduled duonebs  - f/u respiratory culture - Strep pnumo urine ag positive - f/u urine legionella ag - Continue zosyn and azithromycin, stop vancomycin. De-escalate other antibiotics based on culture data - RASS goal -1, propofol drip and  fentanyl boluses ordered  Sepsis due to pneumonia vs UTI - fluid resuscitation per sepsis protocol - antibiotics as above - hx of MDR klebsiella in urine 06/05/22, continue zosyn - f/u urine culture  Acute Toxic Metabolic Encephalopathy Alcohol Abuse, risk for alcohol withdrawal Polysubstance Use - alcohol level of 48 on arrival in ED, undetectable today - continue moderate dose phenobarbital taper - multivitamin, folic acid and thiamine per tube - UDS positive for amphetamines and cocaine  Acute Kidney Injury - fluid resuscitation per sepsis protocol - monitor UOP and serum Cr  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 - will need imaging to confirm and possibly Urology consult - covered with ostomy bag  Elevated Troponin - in setting of sepsis, will trend along with EKGs  Best Practice (right click and "Reselect all SmartList Selections" daily)   Diet/type: tubefeeds DVT prophylaxis: prophylactic heparin  GI prophylaxis: H2B Lines: N/A Foley:  N/A Code Status:  full code Last date of multidisciplinary goals of care discussion [n/a]  Labs   CBC: Recent Labs  Lab 10/25/22 2019 10/26/22 1031 10/26/22 1217 10/26/22 1330  WBC 5.9  --   --  4.9  NEUTROABS 4.4  --   --   --   HGB 13.0 13.3 12.6 11.8*  HCT 41.7 39.0 37.0 38.0  MCV 90.3  --   --  89.6  PLT 233  --   --  732    Basic Metabolic Panel: Recent Labs  Lab 10/25/22 2019 10/26/22 0929 10/26/22 1031 10/26/22 1217 10/26/22 1430  NA 136 139 141 141  --   K 3.6 5.1 3.9 3.4*  --   CL 105 110  --   --   --   CO2 14* 17*  --   --   --   GLUCOSE 81 76  --   --   --   BUN 18 20  --   --   --   CREATININE 1.81* 1.56*  --   --   --   CALCIUM 8.9 8.1*  --   --   --   PHOS  --   --   --   --  3.7   GFR: Estimated Creatinine Clearance: 32.6 mL/min (A) (by C-G formula based on SCr of 1.56 mg/dL (H)). Recent Labs  Lab 10/25/22 2019 10/26/22 1330  PROCALCITON  --  5.39  WBC 5.9 4.9    Liver Function  Tests: Recent Labs  Lab 10/25/22 2019 10/26/22 1330  AST 23 23  ALT 11 7  ALKPHOS 77 58  BILITOT 0.1* 0.4  PROT 8.1 5.9*  ALBUMIN 3.2* 2.1*   No results for input(s): "LIPASE", "AMYLASE" in the last 168 hours. No results for input(s): "AMMONIA" in the last 168 hours.  ABG    Component Value Date/Time   PHART 7.265 (L) 10/26/2022 1217   PCO2ART 43.8 10/26/2022 1217   PO2ART 81 (L) 10/26/2022 1217   HCO3 19.5 (L) 10/26/2022 1217   TCO2 21 (L) 10/26/2022 1217   ACIDBASEDEF 7.0 (H) 10/26/2022 1217   O2SAT 92 10/26/2022 1217  Coagulation Profile: Recent Labs  Lab 10/26/22 1330  INR 1.1    Cardiac Enzymes: No results for input(s): "CKTOTAL", "CKMB", "CKMBINDEX", "TROPONINI" in the last 168 hours.  HbA1C: Hgb A1c MFr Bld  Date/Time Value Ref Range Status  02/06/2022 06:34 AM 5.0 4.8 - 5.6 % Final    Comment:    (NOTE) Pre diabetes:          5.7%-6.4%  Diabetes:              >6.4%  Glycemic control for   <7.0% adults with diabetes   10/29/2021 07:27 AM 5.2 4.8 - 5.6 % Final    Comment:    (NOTE) Pre diabetes:          5.7%-6.4%  Diabetes:              >6.4%  Glycemic control for   <7.0% adults with diabetes     CBG: Recent Labs  Lab 10/26/22 1524 10/26/22 1931 10/26/22 2321 10/27/22 0315  GLUCAP 75 91 91 80      Critical care time:  35 minutes    Freda Jackson, MD [facility] Office: [phone]   See Amion for personal pager PCCM on call pager [phone] until 7pm. Please call Elink 7p-7a. [phone]

## 2022-10-28 DIAGNOSIS — J9601 Acute respiratory failure with hypoxia: Secondary | ICD-10-CM | POA: Diagnosis not present

## 2022-10-28 LAB — URINE CULTURE: Culture: >=100000 — AB

## 2022-10-28 LAB — BASIC METABOLIC PANEL
Anion gap: 7 (ref 5–15)
BUN: 32 mg/dL — ABNORMAL HIGH (ref 6–20)
CO2: 19 mmol/L — ABNORMAL LOW (ref 22–32)
Calcium: 8.1 mg/dL — ABNORMAL LOW (ref 8.9–10.3)
Chloride: 115 mmol/L — ABNORMAL HIGH (ref 98–111)
Creatinine, Ser: 1.62 mg/dL — ABNORMAL HIGH (ref 0.44–1.00)
GFR, Estimated: 38 mL/min — ABNORMAL LOW (ref >60)
Glucose, Bld: 115 mg/dL — ABNORMAL HIGH (ref 70–99)
Potassium: 3.4 mmol/L — ABNORMAL LOW (ref 3.5–5.1)
Sodium: 141 mmol/L (ref 135–145)

## 2022-10-28 LAB — CULTURE, RESPIRATORY W GRAM STAIN

## 2022-10-28 LAB — CBC
HCT: 29.8 % — ABNORMAL LOW (ref 36.0–46.0)
Hemoglobin: 9.5 g/dL — ABNORMAL LOW (ref 12.0–15.0)
MCH: 28.3 pg (ref 26.0–34.0)
MCHC: 31.9 g/dL (ref 30.0–36.0)
MCV: 88.7 fL (ref 80.0–100.0)
Platelets: 178 10*3/uL (ref 150–400)
RBC: 3.36 MIL/uL — ABNORMAL LOW (ref 3.87–5.11)
RDW: 16.5 % — ABNORMAL HIGH (ref 11.5–15.5)
WBC: 12.7 10*3/uL — ABNORMAL HIGH (ref 4.0–10.5)
nRBC: 0 % (ref 0.0–0.2)

## 2022-10-28 LAB — PHOSPHORUS
Phosphorus: 3.7 mg/dL (ref 2.5–4.6)
Phosphorus: 4 mg/dL (ref 2.5–4.6)

## 2022-10-28 LAB — GLUCOSE, CAPILLARY
Glucose-Capillary: 100 mg/dL — ABNORMAL HIGH (ref 70–99)
Glucose-Capillary: 115 mg/dL — ABNORMAL HIGH (ref 70–99)
Glucose-Capillary: 97 mg/dL (ref 70–99)
Glucose-Capillary: 102 mg/dL — ABNORMAL HIGH (ref 70–99)
Glucose-Capillary: 109 mg/dL — ABNORMAL HIGH (ref 70–99)

## 2022-10-28 LAB — MAGNESIUM
Magnesium: 2 mg/dL (ref 1.7–2.4)
Magnesium: 2.2 mg/dL (ref 1.7–2.4)

## 2022-10-28 MED ORDER — SERTRALINE HCL 50 MG PO TABS
50.0000 mg | ORAL_TABLET | Freq: Every day | ORAL | Status: DC
  Administered 2022-10-28 – 2022-11-08 (×12): 50 mg
  Filled 2022-10-28 (×12): qty 1

## 2022-10-28 MED ORDER — MEDIHONEY WOUND/BURN DRESSING EX PSTE
1.0000 | PASTE | Freq: Every day | CUTANEOUS | Status: DC
  Administered 2022-10-28 – 2022-11-08 (×12): 1 via TOPICAL
  Filled 2022-10-28 (×3): qty 44

## 2022-10-28 MED ORDER — BUSPIRONE HCL 5 MG PO TABS
15.0000 mg | ORAL_TABLET | Freq: Two times a day (BID) | ORAL | Status: DC
  Administered 2022-10-28 – 2022-11-08 (×23): 15 mg
  Filled 2022-10-28 (×23): qty 1

## 2022-10-28 MED ORDER — DEXMEDETOMIDINE HCL IN NACL 400 MCG/100ML IV SOLN
0.4000 ug/kg/h | INTRAVENOUS | Status: DC
  Administered 2022-10-28: 0.4 ug/kg/h via INTRAVENOUS
  Administered 2022-10-28: 0.5 ug/kg/h via INTRAVENOUS
  Administered 2022-10-29: 0.7 ug/kg/h via INTRAVENOUS
  Administered 2022-10-29: 0.6 ug/kg/h via INTRAVENOUS
  Administered 2022-10-30 – 2022-10-31 (×3): 1.2 ug/kg/h via INTRAVENOUS
  Administered 2022-10-31: 1 ug/kg/h via INTRAVENOUS
  Administered 2022-10-31: 1.2 ug/kg/h via INTRAVENOUS
  Administered 2022-11-01 (×2): 0.6 ug/kg/h via INTRAVENOUS
  Administered 2022-11-02: 0.8 ug/kg/h via INTRAVENOUS
  Administered 2022-11-02: 0.6 ug/kg/h via INTRAVENOUS
  Administered 2022-11-03: 1.2 ug/kg/h via INTRAVENOUS
  Administered 2022-11-03: 0.9 ug/kg/h via INTRAVENOUS
  Administered 2022-11-03 – 2022-11-05 (×8): 2 ug/kg/h via INTRAVENOUS
  Filled 2022-10-28: qty 200
  Filled 2022-10-28 (×8): qty 100
  Filled 2022-10-28: qty 200
  Filled 2022-10-28 (×6): qty 100
  Filled 2022-10-28: qty 200
  Filled 2022-10-28 (×6): qty 100

## 2022-10-28 MED ORDER — OSMOLITE 1.2 CAL PO LIQD
1000.0000 mL | ORAL | Status: DC
  Administered 2022-10-28 – 2022-11-04 (×8): 1000 mL
  Filled 2022-10-28 (×16): qty 1000

## 2022-10-28 MED ORDER — SODIUM CHLORIDE 0.9 % IV SOLN: INTRAVENOUS | Status: DC | PRN

## 2022-10-28 MED ORDER — JUVEN PO PACK
1.0000 | PACK | Freq: Two times a day (BID) | ORAL | Status: DC
  Administered 2022-10-28 – 2022-11-08 (×22): 1
  Filled 2022-10-28 (×23): qty 1

## 2022-10-28 MED ORDER — MUPIROCIN 2 % EX OINT
1.0000 | TOPICAL_OINTMENT | Freq: Two times a day (BID) | CUTANEOUS | Status: AC
  Administered 2022-10-28 – 2022-11-02 (×10): 1 via NASAL
  Filled 2022-10-28: qty 22

## 2022-10-28 MED ORDER — HYDRALAZINE HCL 20 MG/ML IJ SOLN
10.0000 mg | INTRAMUSCULAR | Status: DC | PRN
  Administered 2022-11-05: 10 mg via INTRAVENOUS
  Filled 2022-10-28 (×2): qty 1

## 2022-10-28 MED ORDER — SODIUM CHLORIDE 0.9 % IV SOLN
2.0000 g | INTRAVENOUS | Status: AC
  Administered 2022-10-28 – 2022-10-30 (×3): 2 g via INTRAVENOUS
  Filled 2022-10-28 (×3): qty 20

## 2022-10-28 MED ORDER — POTASSIUM CHLORIDE 20 MEQ PO PACK
60.0000 meq | PACK | Freq: Once | ORAL | Status: AC
  Administered 2022-10-28: 60 meq
  Filled 2022-10-28: qty 3

## 2022-10-28 NOTE — Progress Notes (Signed)
NAME:  Sherry Baker, MRN:  [id number], DOB:  November 28, 1965, LOS: 2 ADMISSION DATE:  10/25/2022, CONSULTATION DATE:  10/26/22 REFERRING MD:  Dr. Dayna Barker CHIEF COMPLAINT:  AMS   History of Present Illness:  Sherry Baker is a 56 year old woman with cirrhosis, polysubstance abuse, rectal cancer s/p iolostomy and bipolar disorder who presented to [facility] ER with altered mental status via EMS and [employer]. She had episodes of agitation in the waiting room. She was later found to be vomiting with emesis coming from her nose. She developed worsening respiratory status with hypoxia where she was placed on non-rebreather mask then required intubation.   PCCM called to admit patient.   Chest radiograph shows well positioned ET tube. New mid and lower left lung air space opacities superimposed on chronic interstitial thickening. Covid/Flu/Rsv negative.  Patient has prior positive MRSA screens this year. Urine culture from 06/2022 shows MDR klebsiella sensitive to zosyn.   Unable to obtain further history due to her being intubated.   Pertinent  Medical History   Past Medical History:  Diagnosis Date   Acquired hypothyroidism    Alcohol abuse    Allergy    PCNS swelling   Arthritis    Bipolar 1 disorder ([place])    Cancer ([place]) 01/21/2017   rectal cancer   Cancer ([place])    Chronic kidney disease    Chronic pain syndrome    Cirrhosis ([place])    Cirrhosis of liver (HCC)    Cocaine abuse ([place])    COVID-19 virus infection 06/23/2021   Delirium tremens ([place]) 06/22/2021   Depression    Genetic testing 03/24/2017   Ms. Bittinger underwent genetic counseling and testing for hereditary cancer syndromes on 02/17/2017. Her results were negative for mutations in all 46 genes analyzed by [facility]'s 46-gene Common Hereditary Cancers Panel. Genes analyzed include: APC, ATM, AXIN2, BARD1, BMPR1A, BRCA1, BRCA2, BRIP1, CDH1, CDKN2A, CHEK2, CTNNA1, DICER1, EPCAM, GREM1, HOXB13, KIT, MEN1, MLH1, MSH2, MSH3, MSH6, MUTYH,  NBN,   HTN (hypertension)    Hypertension    Polysubstance abuse ([place])    Rectal cancer ([place])    Suicidal ideation 02/11/2014   Significant Hospital Events: Including procedures, antibiotic start and stop dates in addition to other pertinent events   12/24 intubated, admitted to ICU 12/25 strep pneumo positive, trach culture moraxella catarrhalis, UC citrobacter freundii; on rocephin/azithro 12/26: remains intubated on prop  Interim History / Subjective:  strep pneumo positive, trach culture moraxella catarrhalis, UC citrobacter freundii; on Rocephin/azithro Sedate on prop On sbt doing well No fluid appreciated this am in suprapubic region  Objective   Blood pressure (!) 147/83, pulse (!) 108, temperature 99.3 F (37.4 C), resp. rate 19, height _0  (1.575 m), weight 50.4 kg, SpO2 100 %.    Vent Mode: PSV;CPAP FiO2 (%):  [30 %] 30 % Set Rate:  [24 bmp] 24 bmp Vt Set:  [400 mL] 400 mL PEEP:  [5 cmH20] 5 cmH20 Pressure Support:  [5 cmH20] 5 cmH20 Plateau Pressure:  [15 cmH20-19 cmH20] 17 cmH20   Intake/Output Summary (Last 24 hours) at 10/28/2022 1111 Last data filed at 10/28/2022 0600 Gross per 24 hour  Intake 1274.11 ml  Output 1510 ml  Net -235.89 ml    Filed Weights   10/26/22 1011 10/27/22 0500 10/28/22 0351  Weight: 50 kg 51.7 kg 50.4 kg    Examination: General:  critically ill appearing on mech vent HEENT: MM pink/moist; ETT in place Neuro: sedated on prop; cough/gag present; perrl  CV: s1s2, RRR, no m/r/g PULM:  dim clear BS bilaterally; on mech vent PRVC GI: soft, bsx4 active  Extremities: warm/dry, no edema  Skin: no rashes or lesions appreciated  Resolved Hospital Problem list     Assessment & Plan:  Acute Hypoxemic Respiratory Failure Aspiration Pneumonia vs HCAP/CAP COPD?: on anoro ellipta at home P: -on sbt doing well, still sedate on prop; will transition to precedex and consider extubation if mental status improves -LTVV strategy with tidal  volumes of 6-8 cc/kg ideal body weight -Wean PEEP/FiO2 for SpO2 >92% -VAP bundle in place -Daily SAT and SBT -PAD protocol in place -wean sedation for RASS goal 0 to -1 -duoneb q6 scheduled -continue rocephin/azithro as below  Sepsis due to pneumonia vs UTI -strep pneumo positive, trach culture moraxella catarrhalis, UC citrobacter freundii P: -continue rocephin/azithro -trend wbc/fever curve -follow cultures  Acute Toxic Metabolic Encephalopathy Alcohol Abuse, risk for alcohol withdrawal Polysubstance Use -alcohol level of 48 on arrival in ED, undetectable today - UDS positive for amphetamines and cocaine P: -continue phenobarb taper -will transition off prop to precedex -cont thiamine, folic acid, mvi -monitor for signs of withdrawal -limit sedating meds  Acute Kidney Injury P: -Trend BMP / urinary output -Replace electrolytes as indicated -Avoid nephrotoxic agents, ensure adequate renal perfusion  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 -Per nurse small hole in suprapubic region leaking clear fluid P: -CT cystogram abd/pelvis per urology -no drainage today; cont ostomy bag over suprapubic region and continue to monitor for drainage -will re-consult urology pending results  HTN P: -hold home BB in setting of cocaine use -prn hydralazine for htn  Rectal cancer s/p iolostomy P: -supportive care -ostomy in place  Hx of bipolar 1: non compliant on meds P: -resume home zoloft and buspar  Elevated Troponin P: - in setting of sepsis  Best Practice (right click and "Reselect all SmartList Selections" daily)   Diet/type: tubefeeds DVT prophylaxis: prophylactic heparin  GI prophylaxis: H2B Lines: N/A Foley:  N/A Code Status:  full code Last date of multidisciplinary goals of care discussion [spoke with cousin Crystal over phone and updated]  Labs   CBC: Recent Labs  Lab 10/25/22 2019 10/26/22 1031 10/26/22 1217 10/26/22 1330 10/27/22 0750  10/27/22 0819  WBC 5.9  --   --  4.9 13.5*  --   NEUTROABS 4.4  --   --   --   --   --   HGB 13.0 13.3 12.6 11.8* 11.9* 10.5*  HCT 41.7 39.0 37.0 38.0 38.5 31.0*  MCV 90.3  --   --  89.6 92.1  --   PLT 233  --   --  167 147*  --      Basic Metabolic Panel: Recent Labs  Lab 10/25/22 2019 10/26/22 0929 10/26/22 1031 10/26/22 1217 10/26/22 1430 10/27/22 0750 10/27/22 0819 10/27/22 1310 10/27/22 1738 10/28/22 0512  NA 136 139 141 141  --  139 140  --   --   --   K 3.6 5.1 3.9 3.4*  --  4.7 4.3  --   --   --   CL 105 110  --   --   --  108  --   --   --   --   CO2 14* 17*  --   --   --  20*  --   --   --   --   GLUCOSE 81 76  --   --   --  73  --   --   --   --  BUN 18 20  --   --   --  21*  --   --   --   --   CREATININE 1.81* 1.56*  --   --   --  1.67*  --   --   --   --   CALCIUM 8.9 8.1*  --   --   --  8.0*  --   --   --   --   MG  --   --   --   --   --  1.8  --  2.0 2.0 2.0  PHOS  --   --   --   --  3.7  --   --  4.4 4.0 3.7    GFR: Estimated Creatinine Clearance: 30.5 mL/min (A) (by C-G formula based on SCr of 1.67 mg/dL (H)). Recent Labs  Lab 10/25/22 2019 10/26/22 1330 10/27/22 0750  PROCALCITON  --  5.39  --   WBC 5.9 4.9 13.5*     Liver Function Tests: Recent Labs  Lab 10/25/22 2019 10/26/22 1330  AST 23 23  ALT 11 7  ALKPHOS 77 58  BILITOT 0.1* 0.4  PROT 8.1 5.9*  ALBUMIN 3.2* 2.1*    No results for input(s): "LIPASE", "AMYLASE" in the last 168 hours. No results for input(s): "AMMONIA" in the last 168 hours.  ABG    Component Value Date/Time   PHART 7.351 10/27/2022 0819   PCO2ART 32.1 10/27/2022 0819   PO2ART 139 (H) 10/27/2022 0819   HCO3 17.7 (L) 10/27/2022 0819   TCO2 19 (L) 10/27/2022 0819   ACIDBASEDEF 7.0 (H) 10/27/2022 0819   O2SAT 99 10/27/2022 0819     Coagulation Profile: Recent Labs  Lab 10/26/22 1330  INR 1.1     Cardiac Enzymes: No results for input(s): "CKTOTAL", "CKMB", "CKMBINDEX", "TROPONINI" in the last  168 hours.  HbA1C: Hgb A1c MFr Bld  Date/Time Value Ref Range Status  02/06/2022 06:34 AM 5.0 4.8 - 5.6 % Final    Comment:    (NOTE) Pre diabetes:          5.7%-6.4%  Diabetes:              >6.4%  Glycemic control for   <7.0% adults with diabetes   10/29/2021 07:27 AM 5.2 4.8 - 5.6 % Final    Comment:    (NOTE) Pre diabetes:          5.7%-6.4%  Diabetes:              >6.4%  Glycemic control for   <7.0% adults with diabetes     CBG: Recent Labs  Lab 10/27/22 1508 10/27/22 1911 10/27/22 2311 10/28/22 0312 10/28/22 0751  GLUCAP 100* 104* 102* 100* 115*       Critical care time:  35 minutes    JD Rexene Agent [facility] 10/28/2022, 11:13 AM  Please see [email or web address] for pager details.  From 7A-7P if no response, please call [phone]. After hours, please call ELink [phone].

## 2022-10-28 NOTE — Progress Notes (Addendum)
Initial Nutrition Assessment  DOCUMENTATION CODES:   Severe malnutrition in context of social or environmental circumstances  INTERVENTION:  Adjust tube feeding via OGT: Osmolite 1.2 at 60 ml/h (1440 ml per day) Start at 20m/h and advance by [id number] to goal of 60 Provides 1728 kcal, 80 gm protein, 1181 ml free water daily Continue Vitamin regimen 1 packet Juven BID, each packet provides 95 calories, 2.5 grams of protein (collagen), and 9.8 grams of carbohydrate (3 grams sugar); also contains 7 grams of L-arginine and L-glutamine, 300 mg vitamin C, 15 mg vitamin E, 1.2 mcg vitamin B-12, 9.5 mg zinc, 200 mg calcium, and 1.5 g  Calcium Beta-hydroxy-Beta-methylbutyrate to support wound healing  NUTRITION DIAGNOSIS:   Severe Malnutrition related to social / environmental circumstances (etoh and drug abuse) as evidenced by severe fat depletion, severe muscle depletion.  GOAL:   Patient will meet greater than or equal to 90% of their needs  MONITOR:   Vent status, Labs, I & O's, TF tolerance  REASON FOR ASSESSMENT:   Consult Enteral/tube feeding initiation and management  ASSESSMENT:   Pt with hx of cirrhosis, hx rectal cancer (s/p colostomy), EtOH/polysubstance abuse, HTN, and CKD brought to ED by police with concern for AMS. Well known to nutrition team from numerous prior admissions.  While in ED, pt agitated and fighting staff/police officers. UDS positive for amphetamines and cocaine and EtOH level 48 in ED. While in ED waiting room, vomiting white foamy emesis and concern for aspiration. Intubated for airway protection and admitted to ICU.    Patient is currently intubated on ventilator support. OGT in place (gastric). Consult for enteral feeds received. Currently has TF protocol entered, will adjust to better meet nutrition needs. Propofol not included in calculations.   MV: 10.1 L/min Temp (24hrs), Avg:98.4 F (36.9 C), Min:97.5 F (36.4 C), Max:99.7 F (37.6  C)  Propofol: 6 ml/hr (158kcal/d)   Intake/Output Summary (Last 24 hours) at 10/28/2022 1343 Last data filed at 10/28/2022 1200 Gross per 24 hour  Intake 1588.45 ml  Output 1935 ml  Net -346.55 ml  Net IO Since Admission: 3,776.34 mL [10/28/22 1343]  Nutritionally Relevant Medications: Scheduled Meds:  famotidine  20 mg Per Tube BID   folic acid  1 mg Per Tube Daily   multivitamin with minerals  1 tablet Per Tube Daily   potassium chloride  60 mEq Per Tube Once   thiamine  100 mg Per Tube Daily   Continuous Infusions:  azithromycin 500 mg (10/28/22 1202)   cefTRIAXone (ROCEPHIN)  IV     dexmedetomidine (PRECEDEX) IV infusion 0.4 mcg/kg/hr (10/28/22 1200)   feeding supplement (OSMOLITE 1.2 CAL)     propofol (DIPRIVAN) infusion 20 mcg/kg/min (10/28/22 1201)   PRN Meds: docusate sodium, polyethylene glycol  Labs Reviewed: K 3.4 Chloride 115 BUN 32, creatinine 1.62 CBG ranges from 80-104 mg/dL over the last 24 hours  NUTRITION - FOCUSED PHYSICAL EXAM: Flowsheet Row Most Recent Value  Orbital Region Severe depletion  Upper Arm Region Severe depletion  Thoracic and Lumbar Region Moderate depletion  Buccal Region Severe depletion  Temple Region Severe depletion  Clavicle Bone Region Severe depletion  Clavicle and Acromion Bone Region Severe depletion  Scapular Bone Region Severe depletion  Dorsal Hand No depletion  [edema]  Patellar Region Severe depletion  Anterior Thigh Region Severe depletion  Posterior Calf Region Severe depletion  Edema (RD Assessment) Mild  [hands, arms]  Hair Reviewed  Eyes Reviewed  Mouth Reviewed  Skin Reviewed  [sNeita Carp  bruising]  Nails Reviewed    Diet Order:   Diet Order             Diet NPO time specified  Diet effective now                   EDUCATION NEEDS:   Not appropriate for education at this time  Skin:  Skin Assessment: Reviewed RN Assessment Stage 1: Right elbow, left elbow Stage 4: coccyx Deep Tissue  Injury: left knee  Last BM:  12/26 - type 5  Height:   Ht Readings from Last 1 Encounters:  10/26/22 '5\' 2"'$  (1.575 m)    Weight:   Wt Readings from Last 1 Encounters:  10/28/22 50.4 kg    Ideal Body Weight:  50 kg  BMI:  Body mass index is 20.32 kg/m.  Estimated Nutritional Needs:   Kcal:  1500-1700 kcal/d  Protein:  75-90g/d  Fluid:  >/=1.5L/d    Ranell Patrick, RD, LDN Clinical Dietitian RD pager # available in [facility]  After hours/weekend pager # available in [facility]

## 2022-10-28 NOTE — TOC CAGE-AID Note (Signed)
Transition of Care [facility]) - CAGE-AID Screening   Patient Details  Name: Sherry Baker MRN: [id number] Date of Birth: 1966-02-03  Transition of Care [facility]) CM/SW Contact:    Milinda Antis, [place] Phone Number: 10/28/2022, 4:17 PM   Clinical Narrative: Patient is currently intubated.  Unable to complete CAGE-AID at this time.    CAGE-AID Screening: Substance Abuse Screening unable to be completed due to: : Patient unable to participate

## 2022-10-28 NOTE — Consult Note (Signed)
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  1 piece pouch intact and will not be changed, but will order 2piece pouch for next pouch change.  Output soft brown stool In pouch Ostomy pouching: 2pc. 2 1/4" pouch Education provided: none   patient intiubated and sedated.   Stoma type/location:  LLQ, SP area, open wound consistent with fistula.  Scant clear liquid effluent noted.  FOley in place to bladder Stomal assessment/size:  0.3 cm opening, located in SP creasing Peristomal assessment: intact LLQ colostomy Treatment options for stomal/peristomal skin: Barrier ring to creasing and around opening.  Cutting barrier off center to avoid colostomy pouching field.  PAttern left at sink. Place piece of barrier ring over center hole in barrier to protect skin.  (I have demonstrated this on the pattern) I have placed 2 piece flat urostomy pouch. Patient legs are folded in tight and the rigid convexity would pop off with turning, I fear.  Output clear liquid effluent Ostomy pouching: 2pc. Urostomy, may use flat urostomy 1piece as well.   Education provided:  None WOC Nurse Consult Note: Reason for Consult: Chronic nonhealing stage 4 sacral wound Wound type: pressure Pressure Injury POA: Yes Measurement: 5 cm x 4 cm wound bed slough, difficult to assess due to legs being pulled up tightly in bed and resistant to repositioning, while sedated and intubated.  Wound bed: slough Drainage (amount, consistency, odor) minimal serosanguinous Periwound:intact Dressing procedure/placement/frequency: cleanse sacral wound with NS and pat dry  Apply medihoney to open wound. Top with gauze and ABD pad/tape.  Change daily. Will not follow at this time.  Please re-consult if needed.  Estrellita Ludwig MSN, RN, FNP-BC CWON Wound, Ostomy, Continence Nurse [facility] [phone] Pager [phone]

## 2022-10-28 NOTE — Progress Notes (Signed)
  Transition of Care [facility]) Screening Note   Patient Details  Name: Sherry Baker Date of Birth: [birth year]   Transition of Care [facility]) CM/SW Contact:    Tom-Johnson, Renea Ee, RN Phone Number: 10/28/2022, 2:51 PM  Patient is admitted for AMS, currently intubated. WOC following for LLQ Ostomy care.   Transition of Care Department [facility]) has reviewed patient and no TOC needs or recommendations have been identified at this time. TOC will continue to monitor patient advancement through interdisciplinary progression rounds. If new patient transition needs arise, please place a TOC consult.

## 2022-10-28 NOTE — Plan of Care (Signed)
Patient remains in MICU. Patient remains intubated and sedated. MRSA (+) standing orders initiated by this RN tonight (had not been previously addressed).   Problem: Safety: Goal: Non-violent Restraint(s) Outcome: Not Progressing   Problem: Education: Goal: Knowledge of General Education information will improve Description: Including pain rating scale, medication(s)/side effects and non-pharmacologic comfort measures Outcome: Not Progressing   Problem: Health Behavior/Discharge Planning: Goal: Ability to manage health-related needs will improve Outcome: Not Progressing   Problem: Clinical Measurements: Goal: Ability to maintain clinical measurements within normal limits will improve Outcome: Not Progressing Goal: Will remain free from infection Outcome: Not Progressing Goal: Diagnostic test results will improve Outcome: Not Progressing Goal: Respiratory complications will improve Outcome: Not Progressing Goal: Cardiovascular complication will be avoided Outcome: Not Progressing   Problem: Activity: Goal: Risk for activity intolerance will decrease Outcome: Not Progressing   Problem: Nutrition: Goal: Adequate nutrition will be maintained Outcome: Not Progressing   Problem: Coping: Goal: Level of anxiety will decrease Outcome: Not Progressing   Problem: Elimination: Goal: Will not experience complications related to bowel motility Outcome: Not Progressing Goal: Will not experience complications related to urinary retention Outcome: Not Progressing   Problem: Pain Managment: Goal: General experience of comfort will improve Outcome: Not Progressing   Problem: Safety: Goal: Ability to remain free from injury will improve Outcome: Not Progressing   Problem: Skin Integrity: Goal: Risk for impaired skin integrity will decrease Outcome: Not Progressing

## 2022-10-29 ENCOUNTER — Inpatient Hospital Stay (HOSPITAL_COMMUNITY): Payer: Medicaid Other

## 2022-10-29 DIAGNOSIS — J9601 Acute respiratory failure with hypoxia: Secondary | ICD-10-CM | POA: Diagnosis not present

## 2022-10-29 LAB — GLUCOSE, CAPILLARY
Glucose-Capillary: 108 mg/dL — ABNORMAL HIGH (ref 70–99)
Glucose-Capillary: 114 mg/dL — ABNORMAL HIGH (ref 70–99)
Glucose-Capillary: 118 mg/dL — ABNORMAL HIGH (ref 70–99)
Glucose-Capillary: 126 mg/dL — ABNORMAL HIGH (ref 70–99)
Glucose-Capillary: 92 mg/dL (ref 70–99)
Glucose-Capillary: 99 mg/dL (ref 70–99)

## 2022-10-29 LAB — CBC
HCT: 30.6 % — ABNORMAL LOW (ref 36.0–46.0)
Hemoglobin: 9.9 g/dL — ABNORMAL LOW (ref 12.0–15.0)
MCH: 28.7 pg (ref 26.0–34.0)
MCHC: 32.4 g/dL (ref 30.0–36.0)
MCV: 88.7 fL (ref 80.0–100.0)
Platelets: 183 10*3/uL (ref 150–400)
RBC: 3.45 MIL/uL — ABNORMAL LOW (ref 3.87–5.11)
RDW: 16.6 % — ABNORMAL HIGH (ref 11.5–15.5)
WBC: 10.8 10*3/uL — ABNORMAL HIGH (ref 4.0–10.5)
nRBC: 0 % (ref 0.0–0.2)

## 2022-10-29 LAB — BASIC METABOLIC PANEL
Anion gap: 8 (ref 5–15)
BUN: 34 mg/dL — ABNORMAL HIGH (ref 6–20)
CO2: 19 mmol/L — ABNORMAL LOW (ref 22–32)
Calcium: 8.7 mg/dL — ABNORMAL LOW (ref 8.9–10.3)
Chloride: 118 mmol/L — ABNORMAL HIGH (ref 98–111)
Creatinine, Ser: 1.54 mg/dL — ABNORMAL HIGH (ref 0.44–1.00)
GFR, Estimated: 40 mL/min — ABNORMAL LOW (ref >60)
Glucose, Bld: 123 mg/dL — ABNORMAL HIGH (ref 70–99)
Potassium: 4.6 mmol/L (ref 3.5–5.1)
Sodium: 145 mmol/L (ref 135–145)

## 2022-10-29 MED ORDER — IOHEXOL 300 MG/ML  SOLN
150.0000 mL | Freq: Once | INTRAMUSCULAR | Status: AC | PRN
  Administered 2022-10-29: 150 mL

## 2022-10-29 NOTE — Progress Notes (Signed)
[place] Progress Note Patient Name: Sherry Baker DOB: 06-Sep-1966 MRN: [id number]   Date of Service  10/29/2022  HPI/Events of Note  Patient is intubated and on a ventilator and needs restraints to prevent self-extubation.  eICU Interventions  Restraints order renewed.        Kerry Kass Albertine Lafoy 10/29/2022, 8:40 PM

## 2022-10-29 NOTE — Progress Notes (Signed)
Patient was transported to CT & back to room on the ventilator with no problems. 

## 2022-10-29 NOTE — Progress Notes (Signed)
NAME:  Sherry Baker, MRN:  [id number], DOB:  12-Jun-1966, LOS: 3 ADMISSION DATE:  10/25/2022, CONSULTATION DATE:  10/26/22 REFERRING MD:  Dr. Dayna Baker CHIEF COMPLAINT:  AMS   History of Present Illness:  Sherry Baker is a 56 year old woman with cirrhosis, polysubstance abuse, rectal cancer s/p iolostomy and bipolar disorder who presented to [facility] ER with altered mental status via EMS and [employer]. She had episodes of agitation in the waiting room. She was later found to be vomiting with emesis coming from her nose. She developed worsening respiratory status with hypoxia where she was placed on non-rebreather mask then required intubation.   PCCM called to admit patient.   Chest radiograph shows well positioned ET tube. New mid and lower left lung air space opacities superimposed on chronic interstitial thickening. Covid/Flu/Rsv negative.  Patient has prior positive MRSA screens this year. Urine culture from 06/2022 shows MDR klebsiella sensitive to zosyn.   Unable to obtain further history due to her being intubated.   Pertinent  Medical History   Past Medical History:  Diagnosis Date   Acquired hypothyroidism    Alcohol abuse    Allergy    PCNS swelling   Arthritis    Bipolar 1 disorder ([place])    Cancer ([place]) 01/21/2017   rectal cancer   Cancer ([place])    Chronic kidney disease    Chronic pain syndrome    Cirrhosis ([place])    Cirrhosis of liver (HCC)    Cocaine abuse ([place])    COVID-19 virus infection 06/23/2021   Delirium tremens ([place]) 06/22/2021   Depression    Genetic testing 03/24/2017   Sherry Baker underwent genetic counseling and testing for hereditary cancer syndromes on 02/17/2017. Her results were negative for mutations in all 46 genes analyzed by [facility]'s 46-gene Common Hereditary Cancers Panel. Genes analyzed include: APC, ATM, AXIN2, BARD1, BMPR1A, BRCA1, BRCA2, BRIP1, CDH1, CDKN2A, CHEK2, CTNNA1, DICER1, EPCAM, GREM1, HOXB13, KIT, MEN1, MLH1, MSH2, MSH3, MSH6, MUTYH,  NBN,   HTN (hypertension)    Hypertension    Polysubstance abuse ([place])    Rectal cancer ([place])    Suicidal ideation 02/11/2014   Significant Hospital Events: Including procedures, antibiotic start and stop dates in addition to other pertinent events   12/24 intubated, admitted to ICU 12/25 strep pneumo positive, trach culture moraxella catarrhalis, UC citrobacter freundii; on rocephin/azithro 12/26: remains intubated on prop 12/27 weaned 2hrs on PSV   Interim History / Subjective:  strep pneumo positive, trach culture moraxella catarrhalis, UC citrobacter freundii; on Rocephin/azithro Plan for CT cystogram abd/pelvis Weaned on PSV, but remains fairly lethargic off sedation 1.9L UOP yesterday  Objective   Blood pressure 137/88, pulse 74, temperature 98.4 F (36.9 C), temperature source Bladder, resp. rate (!) 21, height _0  (1.575 m), weight 47.3 kg, SpO2 100 %.    Vent Mode: PRVC FiO2 (%):  [30 %] 30 % Set Rate:  [24 bmp] 24 bmp Vt Set:  [400 mL] 400 mL PEEP:  [5 cmH20] 5 cmH20 Pressure Support:  [5 cmH20] 5 cmH20 Plateau Pressure:  [14 cmH20-18 cmH20] 18 cmH20   Intake/Output Summary (Last 24 hours) at 10/29/2022 0810 Last data filed at 10/29/2022 0700 Gross per 24 hour  Intake 2077.61 ml  Output 2140 ml  Net -62.39 ml    Filed Weights   10/27/22 0500 10/28/22 0351 10/29/22 0500  Weight: 51.7 kg 50.4 kg 47.3 kg   General:  chronically and critically ill-appearing F mechanically ventilated HEENT: MM pink/moist, sclera  anicteric  Neuro: opens eyes briefly to voice and falls back asleep, not following commands CV: s1s2 rrr, no m/r/g PULM:  mildly decreased air entry bilaterally, no rhonchi or wheezing, tolerating PSV GI: soft, non-distended, ostomy without surrounding erythema, no supra-pubic fluid noted Extremities: warm/dry, no edema  Skin: no rashes or lesions    Labs reviewed Creatinine 1.5 WBC 10   Resolved Hospital Problem list     Assessment & Plan:      Acute Hypoxemic Respiratory Failure Aspiration Pneumonia vs HCAP/CAP COPD?: on anoro ellipta at home -off sedation this AM, tolerating vent wean with PSV  -monitor mental status, possible extubation if improving today -LTVV strategy with tidal volumes of 6-8 cc/kg ideal body weight -Wean PEEP/FiO2 for SpO2 >92% -VAP bundle in place -Daily SAT and SBT -PAD protocol in place -wean sedation for RASS goal 0 to -1 -duoneb q6 scheduled -continue rocephin/azithro as below  Sepsis due to pneumonia vs UTI -strep pneumo positive, trach culture moraxella catarrhalis, UC citrobacter freundii -continue rocephin/azithro -trend wbc/fever curve  Acute Toxic Metabolic Encephalopathy Alcohol Abuse, risk for alcohol withdrawal Polysubstance Use -alcohol level of 48 on arrival in ED, undetectable today - UDS positive for amphetamines and cocaine -continue phenobarb taper -precedex overnight, held this AM -cont thiamine, folic acid, mvi -monitor for signs of withdrawal -limit sedating meds  Acute Kidney Injury Creatinine stable 1.5, good UOP P: -continue to Trend BMP / urinary output -Replace electrolytes as indicated -Avoid nephrotoxic agents, ensure adequate renal perfusion  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 -Per nurse small hole in suprapubic region leaking clear fluid -CT cystogram abd/pelvis today per urology -no drainage today; cont ostomy bag over suprapubic region and continue to monitor for drainage -will re-consult urology pending results  HTN -hold home BB in setting of cocaine use -prn hydralazine for htn  Rectal cancer s/p ileostomy -supportive care -ostomy in place  Hx of bipolar 1:  non compliant on meds -resume home zoloft and buspar  Elevated Troponin - in setting of sepsis, likely demand  Best Practice (right click and "Reselect all SmartList Selections" daily)   Diet/type: tubefeeds DVT prophylaxis: prophylactic heparin  GI prophylaxis:  H2B Lines: N/A Foley:  Yes, and it is still needed Code Status:  full code Last date of multidisciplinary goals of care discussion pending [will try to reach family again today]  Labs   CBC: Recent Labs  Lab 10/25/22 2019 10/26/22 1031 10/26/22 1330 10/27/22 0750 10/27/22 0819 10/28/22 1202 10/29/22 0149  WBC 5.9  --  4.9 13.5*  --  12.7* 10.8*  NEUTROABS 4.4  --   --   --   --   --   --   HGB 13.0   < > 11.8* 11.9* 10.5* 9.5* 9.9*  HCT 41.7   < > 38.0 38.5 31.0* 29.8* 30.6*  MCV 90.3  --  89.6 92.1  --  88.7 88.7  PLT 233  --  167 147*  --  178 183   < > = values in this interval not displayed.     Basic Metabolic Panel: Recent Labs  Lab 10/25/22 2019 10/26/22 0929 10/26/22 1031 10/26/22 1217 10/26/22 1430 10/27/22 0750 10/27/22 0819 10/27/22 1310 10/27/22 1738 10/28/22 0512 10/28/22 1202 10/28/22 1609 10/29/22 0149  NA 136 139   < > 141  --  139 140  --   --   --  141  --  145  K 3.6 5.1   < > 3.4*  --  4.7 4.3  --   --   --  3.4*  --  4.6  CL 105 110  --   --   --  108  --   --   --   --  115*  --  118*  CO2 14* 17*  --   --   --  20*  --   --   --   --  19*  --  19*  GLUCOSE 81 76  --   --   --  73  --   --   --   --  115*  --  123*  BUN 18 20  --   --   --  21*  --   --   --   --  32*  --  34*  CREATININE 1.81* 1.56*  --   --   --  1.67*  --   --   --   --  1.62*  --  1.54*  CALCIUM 8.9 8.1*  --   --   --  8.0*  --   --   --   --  8.1*  --  8.7*  MG  --   --   --   --   --  1.8  --  2.0 2.0 2.0  --  2.2  --   PHOS  --   --   --   --  3.7  --   --  4.4 4.0 3.7  --  4.0  --    < > = values in this interval not displayed.    GFR: Estimated Creatinine Clearance: 31.2 mL/min (A) (by C-G formula based on SCr of 1.54 mg/dL (H)). Recent Labs  Lab 10/26/22 1330 10/27/22 0750 10/28/22 1202 10/29/22 0149  PROCALCITON 5.39  --   --   --   WBC 4.9 13.5* 12.7* 10.8*     Liver Function Tests: Recent Labs  Lab 10/25/22 2019 10/26/22 1330  AST 23 23  ALT  11 7  ALKPHOS 77 58  BILITOT 0.1* 0.4  PROT 8.1 5.9*  ALBUMIN 3.2* 2.1*    No results for input(s): "LIPASE", "AMYLASE" in the last 168 hours. No results for input(s): "AMMONIA" in the last 168 hours.  ABG    Component Value Date/Time   PHART 7.351 10/27/2022 0819   PCO2ART 32.1 10/27/2022 0819   PO2ART 139 (H) 10/27/2022 0819   HCO3 17.7 (L) 10/27/2022 0819   TCO2 19 (L) 10/27/2022 0819   ACIDBASEDEF 7.0 (H) 10/27/2022 0819   O2SAT 99 10/27/2022 0819     Coagulation Profile: Recent Labs  Lab 10/26/22 1330  INR 1.1     Cardiac Enzymes: No results for input(s): "CKTOTAL", "CKMB", "CKMBINDEX", "TROPONINI" in the last 168 hours.  HbA1C: Hgb A1c MFr Bld  Date/Time Value Ref Range Status  02/06/2022 06:34 AM 5.0 4.8 - 5.6 % Final    Comment:    (NOTE) Pre diabetes:          5.7%-6.4%  Diabetes:              >6.4%  Glycemic control for   <7.0% adults with diabetes   10/29/2021 07:27 AM 5.2 4.8 - 5.6 % Final    Comment:    (NOTE) Pre diabetes:          5.7%-6.4%  Diabetes:              >6.4%  Glycemic control for   <7.0% adults with diabetes     CBG: Recent Labs  Lab  10/28/22 1519 10/28/22 1947 10/29/22 0009 10/29/22 0344 10/29/22 0743  GLUCAP 102* 109* 126* 118* 99       Critical care time:  40 minutes    CRITICAL CARE Performed by: Otilio Carpen Marcellius Montagna   Total critical care time: 40 minutes  Critical care time was exclusive of separately billable procedures and treating other patients.  Critical care was necessary to treat or prevent imminent or life-threatening deterioration.  Critical care was time spent personally by me on the following activities: development of treatment plan with patient and/or surrogate as well as nursing, discussions with consultants, evaluation of patient's response to treatment, examination of patient, obtaining history from patient or surrogate, ordering and performing treatments and interventions, ordering and  review of laboratory studies, ordering and review of radiographic studies, pulse oximetry and re-evaluation of patient's condition.  Otilio Carpen Melaysia Streed, PA-C [facility] See Amion for pager If no response to pager , please call [phone] until 7pm After 7:00 pm call Elink  [phone]?[place]

## 2022-10-30 ENCOUNTER — Inpatient Hospital Stay (HOSPITAL_COMMUNITY): Payer: Medicaid Other

## 2022-10-30 DIAGNOSIS — R0902 Hypoxemia: Secondary | ICD-10-CM | POA: Diagnosis not present

## 2022-10-30 DIAGNOSIS — J9601 Acute respiratory failure with hypoxia: Secondary | ICD-10-CM | POA: Diagnosis not present

## 2022-10-30 LAB — GLUCOSE, CAPILLARY
Glucose-Capillary: 128 mg/dL — ABNORMAL HIGH (ref 70–99)
Glucose-Capillary: 128 mg/dL — ABNORMAL HIGH (ref 70–99)
Glucose-Capillary: 138 mg/dL — ABNORMAL HIGH (ref 70–99)
Glucose-Capillary: 139 mg/dL — ABNORMAL HIGH (ref 70–99)
Glucose-Capillary: 148 mg/dL — ABNORMAL HIGH (ref 70–99)
Glucose-Capillary: 205 mg/dL — ABNORMAL HIGH (ref 70–99)

## 2022-10-30 LAB — CBC
HCT: 33.2 % — ABNORMAL LOW (ref 36.0–46.0)
Hemoglobin: 10.6 g/dL — ABNORMAL LOW (ref 12.0–15.0)
MCH: 28.6 pg (ref 26.0–34.0)
MCHC: 31.9 g/dL (ref 30.0–36.0)
MCV: 89.5 fL (ref 80.0–100.0)
Platelets: 183 10*3/uL (ref 150–400)
RBC: 3.71 MIL/uL — ABNORMAL LOW (ref 3.87–5.11)
RDW: 16.8 % — ABNORMAL HIGH (ref 11.5–15.5)
WBC: 5.1 10*3/uL (ref 4.0–10.5)
nRBC: 0 % (ref 0.0–0.2)

## 2022-10-30 LAB — BASIC METABOLIC PANEL
Anion gap: 7 (ref 5–15)
BUN: 24 mg/dL — ABNORMAL HIGH (ref 6–20)
CO2: 22 mmol/L (ref 22–32)
Calcium: 8.7 mg/dL — ABNORMAL LOW (ref 8.9–10.3)
Chloride: 116 mmol/L — ABNORMAL HIGH (ref 98–111)
Creatinine, Ser: 1.53 mg/dL — ABNORMAL HIGH (ref 0.44–1.00)
GFR, Estimated: 40 mL/min — ABNORMAL LOW (ref >60)
Glucose, Bld: 121 mg/dL — ABNORMAL HIGH (ref 70–99)
Potassium: 4.5 mmol/L (ref 3.5–5.1)
Sodium: 145 mmol/L (ref 135–145)

## 2022-10-30 LAB — MAGNESIUM: Magnesium: 2.1 mg/dL (ref 1.7–2.4)

## 2022-10-30 LAB — TRIGLYCERIDES: Triglycerides: 92 mg/dL (ref <150)

## 2022-10-30 MED ORDER — FUROSEMIDE 10 MG/ML IJ SOLN
40.0000 mg | Freq: Once | INTRAMUSCULAR | Status: AC
  Administered 2022-10-30: 40 mg via INTRAVENOUS
  Filled 2022-10-30: qty 4

## 2022-10-30 MED ORDER — SODIUM CHLORIDE 0.9 % IV SOLN
2.0000 g | INTRAVENOUS | Status: AC
  Administered 2022-10-31 – 2022-11-01 (×2): 2 g via INTRAVENOUS
  Filled 2022-10-30 (×2): qty 20

## 2022-10-30 MED ORDER — ARFORMOTEROL TARTRATE 15 MCG/2ML IN NEBU
15.0000 ug | INHALATION_SOLUTION | Freq: Two times a day (BID) | RESPIRATORY_TRACT | Status: DC
  Administered 2022-10-30 – 2022-11-05 (×13): 15 ug via RESPIRATORY_TRACT
  Filled 2022-10-30 (×13): qty 2

## 2022-10-30 MED ORDER — SODIUM CHLORIDE 0.9 % IV SOLN
500.0000 mg | INTRAVENOUS | Status: DC
  Filled 2022-10-30: qty 5

## 2022-10-30 MED ORDER — FENTANYL 2500MCG IN NS 250ML (10MCG/ML) PREMIX INFUSION
50.0000 ug/h | INTRAVENOUS | Status: DC
  Administered 2022-10-30: 200 ug/h via INTRAVENOUS
  Administered 2022-10-30: 100 ug/h via INTRAVENOUS
  Administered 2022-10-31: 200 ug/h via INTRAVENOUS
  Administered 2022-10-31: 150 ug/h via INTRAVENOUS
  Administered 2022-11-01: 75 ug/h via INTRAVENOUS
  Administered 2022-11-03: 50 ug/h via INTRAVENOUS
  Filled 2022-10-30 (×4): qty 250

## 2022-10-30 MED ORDER — FENTANYL 2500MCG IN NS 250ML (10MCG/ML) PREMIX INFUSION
0.0000 ug/h | INTRAVENOUS | Status: DC
  Filled 2022-10-30: qty 250

## 2022-10-30 MED ORDER — MIDAZOLAM HCL 2 MG/2ML IJ SOLN
1.0000 mg | INTRAMUSCULAR | Status: DC | PRN
  Administered 2022-10-30 – 2022-11-03 (×3): 2 mg via INTRAVENOUS
  Filled 2022-10-30 (×3): qty 2

## 2022-10-30 MED ORDER — METHYLPREDNISOLONE SODIUM SUCC 125 MG IJ SOLR
125.0000 mg | Freq: Once | INTRAMUSCULAR | Status: AC
  Administered 2022-10-30: 125 mg via INTRAVENOUS
  Filled 2022-10-30: qty 2

## 2022-10-30 MED ORDER — SODIUM CHLORIDE 0.9 % IV SOLN
250.0000 mL | INTRAVENOUS | Status: DC
  Administered 2022-10-30: 250 mL via INTRAVENOUS

## 2022-10-30 MED ORDER — POLYETHYLENE GLYCOL 3350 17 G PO PACK
17.0000 g | PACK | Freq: Every day | ORAL | Status: DC
  Administered 2022-10-30 – 2022-11-01 (×3): 17 g
  Filled 2022-10-30 (×3): qty 1

## 2022-10-30 MED ORDER — ALBUTEROL SULFATE (2.5 MG/3ML) 0.083% IN NEBU
2.5000 mg | INHALATION_SOLUTION | Freq: Four times a day (QID) | RESPIRATORY_TRACT | Status: DC
  Administered 2022-10-30 – 2022-11-01 (×7): 2.5 mg via RESPIRATORY_TRACT
  Filled 2022-10-30 (×7): qty 3

## 2022-10-30 MED ORDER — FAMOTIDINE 20 MG PO TABS
20.0000 mg | ORAL_TABLET | Freq: Every day | ORAL | Status: DC
  Administered 2022-10-31 – 2022-11-05 (×6): 20 mg
  Filled 2022-10-30 (×6): qty 1

## 2022-10-30 MED ORDER — REVEFENACIN 175 MCG/3ML IN SOLN
175.0000 ug | Freq: Every day | RESPIRATORY_TRACT | Status: DC
  Administered 2022-10-30 – 2022-11-05 (×7): 175 ug via RESPIRATORY_TRACT
  Filled 2022-10-30 (×7): qty 3

## 2022-10-30 MED ORDER — PROPOFOL 1000 MG/100ML IV EMUL
5.0000 ug/kg/min | INTRAVENOUS | Status: DC
  Administered 2022-10-30: 10 ug/kg/min via INTRAVENOUS
  Administered 2022-10-30 – 2022-11-01 (×5): 30 ug/kg/min via INTRAVENOUS
  Administered 2022-11-02 – 2022-11-03 (×3): 20 ug/kg/min via INTRAVENOUS
  Filled 2022-10-30 (×9): qty 100

## 2022-10-30 MED ORDER — MIDAZOLAM HCL 2 MG/2ML IJ SOLN
2.0000 mg | Freq: Once | INTRAMUSCULAR | Status: AC
  Administered 2022-10-30: 2 mg via INTRAVENOUS
  Filled 2022-10-30: qty 2

## 2022-10-30 MED ORDER — FENTANYL BOLUS VIA INFUSION
50.0000 ug | INTRAVENOUS | Status: DC | PRN
  Administered 2022-10-30 – 2022-11-03 (×8): 100 ug via INTRAVENOUS

## 2022-10-30 MED ORDER — DOCUSATE SODIUM 50 MG/5ML PO LIQD
100.0000 mg | Freq: Two times a day (BID) | ORAL | Status: DC
  Administered 2022-10-30 – 2022-11-03 (×8): 100 mg
  Filled 2022-10-30 (×8): qty 10

## 2022-10-30 MED ORDER — NOREPINEPHRINE 4 MG/250ML-% IV SOLN
2.0000 ug/min | INTRAVENOUS | Status: DC
  Administered 2022-10-30: 2 ug/min via INTRAVENOUS
  Filled 2022-10-30: qty 250

## 2022-10-30 MED ORDER — ALBUTEROL SULFATE (2.5 MG/3ML) 0.083% IN NEBU
5.0000 mg/h | INHALATION_SOLUTION | RESPIRATORY_TRACT | Status: DC
  Administered 2022-10-30: 5 mg/h via RESPIRATORY_TRACT
  Filled 2022-10-30: qty 9

## 2022-10-30 NOTE — Progress Notes (Signed)
Patient attempted to self-extubate and disconnected herself from the ventilator. Patient was placed back on the vent and her ET tube holder was changed and secured. Patient receiving her volumes with bilateral breath sounds noted. RT will continue to monitor

## 2022-10-30 NOTE — Progress Notes (Signed)
[facility] Progress Note Patient Name: Sherry Baker DOB: Mar 13, 1966 MRN: [id number]   Date of Service  10/30/2022  HPI/Events of Note  Patient with sub-optimal sedation with intermittent Fentanyl boluses.  eICU Interventions  Fentanyl gtt ordered.        Kerry Kass Duanna Runk 10/30/2022, 7:01 AM

## 2022-10-30 NOTE — Progress Notes (Signed)
[facility] Progress Note Patient Name: Sherry Baker DOB: April 25, 1966 MRN: [id number]   Date of Service  10/30/2022  HPI/Events of Note  Patient vomited and there is a concern for aspiration.  eICU Interventions  Portable CXR ordered.        Kerry Kass Andriy Sherk 10/30/2022, 7:59 PM

## 2022-10-30 NOTE — Progress Notes (Signed)
Adjust famotidine to '20mg'$  qday due to her CrCl.  Onnie Boer, PharmD, BCIDP, AAHIVP, CPP Infectious Disease Pharmacist 10/30/2022 6:11 PM

## 2022-10-30 NOTE — Progress Notes (Addendum)
NAME:  Sherry Baker, MRN:  [id number], DOB:  Jun 05, 1966, LOS: 4 ADMISSION DATE:  10/25/2022, CONSULTATION DATE:  10/26/22 REFERRING MD:  Dr. Dayna Barker CHIEF COMPLAINT:  AMS   History of Present Illness:  Sherry Baker is a 56 year old woman with cirrhosis, polysubstance abuse, rectal cancer s/p iolostomy and bipolar disorder who presented to [facility] ER with altered mental status via EMS and [employer]. She had episodes of agitation in the waiting room. She was later found to be vomiting with emesis coming from her nose. She developed worsening respiratory status with hypoxia where she was placed on non-rebreather mask then required intubation.   PCCM called to admit patient.   Chest radiograph shows well positioned ET tube. New mid and lower left lung air space opacities superimposed on chronic interstitial thickening. Covid/Flu/Rsv negative.  Patient has prior positive MRSA screens this year. Urine culture from 06/2022 shows MDR klebsiella sensitive to zosyn.   Unable to obtain further history due to her being intubated.   Pertinent  Medical History   Past Medical History:  Diagnosis Date   Acquired hypothyroidism    Alcohol abuse    Allergy    PCNS swelling   Arthritis    Bipolar 1 disorder ([place])    Cancer ([place]) 01/21/2017   rectal cancer   Cancer ([place])    Chronic kidney disease    Chronic pain syndrome    Cirrhosis ([place])    Cirrhosis of liver (HCC)    Cocaine abuse ([place])    COVID-19 virus infection 06/23/2021   Delirium tremens ([place]) 06/22/2021   Depression    Genetic testing 03/24/2017   Ms. Lacerte underwent genetic counseling and testing for hereditary cancer syndromes on 02/17/2017. Her results were negative for mutations in all 46 genes analyzed by [facility]'s 46-gene Common Hereditary Cancers Panel. Genes analyzed include: APC, ATM, AXIN2, BARD1, BMPR1A, BRCA1, BRCA2, BRIP1, CDH1, CDKN2A, CHEK2, CTNNA1, DICER1, EPCAM, GREM1, HOXB13, KIT, MEN1, MLH1, MSH2, MSH3, MSH6, MUTYH,  NBN,   HTN (hypertension)    Hypertension    Polysubstance abuse ([place])    Rectal cancer ([place])    Suicidal ideation 02/11/2014   Significant Hospital Events: Including procedures, antibiotic start and stop dates in addition to other pertinent events   12/24 intubated, admitted to ICU 12/25 strep pneumo positive, trach culture moraxella catarrhalis, UC citrobacter freundii; on rocephin/azithro 12/26: remains intubated on prop 12/27 weaned 2hrs on PSV   Interim History / Subjective:  strep pneumo positive, trach culture moraxella catarrhalis, UC citrobacter freundii; on Rocephin/azithro Agitated overnight on precedex and Fentanyl pushes  Objective   Blood pressure (!) 146/89, pulse 80, temperature 99 F (37.2 C), temperature source Bladder, resp. rate (!) 24, height 5' 2" (1.575 m), weight 42.3 kg, SpO2 100 %.    Vent Mode: PRVC FiO2 (%):  [30 %] 30 % Set Rate:  [24 bmp] 24 bmp Vt Set:  [400 mL] 400 mL PEEP:  [5 cmH20] 5 cmH20 Plateau Pressure:  [15 cmH20-18 cmH20] 18 cmH20   Intake/Output Summary (Last 24 hours) at 10/30/2022 0817 Last data filed at 10/30/2022 0700 Gross per 24 hour  Intake 2197.67 ml  Output 1950 ml  Net 247.67 ml    Filed Weights   10/28/22 0351 10/29/22 0500 10/30/22 0450  Weight: 50.4 kg 47.3 kg 42.3 kg   General:  chronically and critically ill-appearing F mechanically ventilated, highly agitted  HEENT: MM pink/moist, sclera anicteric  Neuro: seen on precedex, kicking, trying to self-extubate CV: s1s2 rrr,  no m/r/g PULM:  decreased air entry throughout  GI: soft, non-distended, ostomy without surrounding erythema, no supra-pubic fluid noted Extremities: warm/dry, no edema  Skin: no rashes or lesions    Labs reviewed Creatinine 1.5 WBC 5.1   Resolved Hospital Problem list     Assessment & Plan:     Acute Hypoxemic Respiratory Failure Aspiration Pneumonia vs HCAP/CAP COPD?: on anoro ellipta at home -highly agitated on precedex this  morning, was going to attempt extubation however sats dropped and became hypertensive, tachycardic and hypoxic  -leave intubated and increase sedation, required propofol, versed and fentanyl to sedate and improve vent synchrony -CXR without dislodged tube -monitor mental status, possible extubation if improving today -LTVV strategy with tidal volumes of 6-8 cc/kg ideal body weight -Wean PEEP/FiO2 for SpO2 >92% -VAP bundle in place -Daily SAT and SBT -PAD protocol in place -wean sedation for RASS goal 0 to -1 -duoneb q6 scheduled -continue rocephin for 5 days and azith, legionella still pending  Sepsis due to pneumonia vs UTI -strep pneumo positive, trach culture moraxella catarrhalis, UC citrobacter freundii -continue rocephin/azithro, day #5 -trend wbc/fever curve  Acute Toxic Metabolic Encephalopathy Alcohol Abuse, risk for alcohol withdrawal Polysubstance Use -alcohol level of 48 on arrival in ED - UDS positive for amphetamines and cocaine -continue phenobarb taper -precedex overnight, held this AM -cont thiamine, folic acid, mvi -monitor for signs of withdrawal -limit sedating meds  Acute Kidney Injury Creatinine stable 1.5, good UOP P: -continue to Trend BMP / urinary output -Replace electrolytes as indicated -Avoid nephrotoxic agents, ensure adequate renal perfusion  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 -Per nurse small hole in suprapubic region leaking clear fluid -CT cystogram abd/pelvis shows 53m fistula from the dome of the bladder and midline skin surface, will need urology follow up -cont ostomy bag over suprapubic region and continue to monitor for drainage  HTN -hold home BB in setting of cocaine use -prn hydralazine for htn  Rectal cancer s/p ileostomy -supportive care -ostomy in place  Hx of bipolar 1:  non compliant on meds -resume home zoloft and buspar  Elevated Troponin - in setting of sepsis, likely demand  Best Practice (right click and  "Reselect all SmartList Selections" daily)   Diet/type: tubefeeds DVT prophylaxis: prophylactic heparin  GI prophylaxis: H2B Lines: N/A Foley:  Yes, and it is still needed Code Status:  full code Last date of multidisciplinary goals of care discussion pending [family update pending today]  Labs   CBC: Recent Labs  Lab 10/25/22 2019 10/26/22 1031 10/26/22 1330 10/27/22 0750 10/27/22 0819 10/28/22 1202 10/29/22 0149 10/30/22 0648  WBC 5.9  --  4.9 13.5*  --  12.7* 10.8* 5.1  NEUTROABS 4.4  --   --   --   --   --   --   --   HGB 13.0   < > 11.8* 11.9* 10.5* 9.5* 9.9* 10.6*  HCT 41.7   < > 38.0 38.5 31.0* 29.8* 30.6* 33.2*  MCV 90.3  --  89.6 92.1  --  88.7 88.7 89.5  PLT 233  --  167 147*  --  178 183 183   < > = values in this interval not displayed.     Basic Metabolic Panel: Recent Labs  Lab 10/25/22 2019 10/26/22 0929 10/26/22 1031 10/26/22 1217 10/26/22 1430 10/27/22 0750 10/27/22 0819 10/27/22 1310 10/27/22 1738 10/28/22 0512 10/28/22 1202 10/28/22 1609 10/29/22 0149  NA 136 139   < > 141  --  139 140  --   --   --  141  --  145  K 3.6 5.1   < > 3.4*  --  4.7 4.3  --   --   --  3.4*  --  4.6  CL 105 110  --   --   --  108  --   --   --   --  115*  --  118*  CO2 14* 17*  --   --   --  20*  --   --   --   --  19*  --  19*  GLUCOSE 81 76  --   --   --  73  --   --   --   --  115*  --  123*  BUN 18 20  --   --   --  21*  --   --   --   --  32*  --  34*  CREATININE 1.81* 1.56*  --   --   --  1.67*  --   --   --   --  1.62*  --  1.54*  CALCIUM 8.9 8.1*  --   --   --  8.0*  --   --   --   --  8.1*  --  8.7*  MG  --   --   --   --   --  1.8  --  2.0 2.0 2.0  --  2.2  --   PHOS  --   --   --   --  3.7  --   --  4.4 4.0 3.7  --  4.0  --    < > = values in this interval not displayed.    GFR: Estimated Creatinine Clearance: 27.9 mL/min (A) (by C-G formula based on SCr of 1.54 mg/dL (H)). Recent Labs  Lab 10/26/22 1330 10/27/22 0750 10/28/22 1202  10/29/22 0149 10/30/22 0648  PROCALCITON 5.39  --   --   --   --   WBC 4.9 13.5* 12.7* 10.8* 5.1     Liver Function Tests: Recent Labs  Lab 10/25/22 2019 10/26/22 1330  AST 23 23  ALT 11 7  ALKPHOS 77 58  BILITOT 0.1* 0.4  PROT 8.1 5.9*  ALBUMIN 3.2* 2.1*    No results for input(s): "LIPASE", "AMYLASE" in the last 168 hours. No results for input(s): "AMMONIA" in the last 168 hours.  ABG    Component Value Date/Time   PHART 7.351 10/27/2022 0819   PCO2ART 32.1 10/27/2022 0819   PO2ART 139 (H) 10/27/2022 0819   HCO3 17.7 (L) 10/27/2022 0819   TCO2 19 (L) 10/27/2022 0819   ACIDBASEDEF 7.0 (H) 10/27/2022 0819   O2SAT 99 10/27/2022 0819     Coagulation Profile: Recent Labs  Lab 10/26/22 1330  INR 1.1     Cardiac Enzymes: No results for input(s): "CKTOTAL", "CKMB", "CKMBINDEX", "TROPONINI" in the last 168 hours.  HbA1C: Hgb A1c MFr Bld  Date/Time Value Ref Range Status  02/06/2022 06:34 AM 5.0 4.8 - 5.6 % Final    Comment:    (NOTE) Pre diabetes:          5.7%-6.4%  Diabetes:              >6.4%  Glycemic control for   <7.0% adults with diabetes   10/29/2021 07:27 AM 5.2 4.8 - 5.6 % Final    Comment:    (NOTE) Pre diabetes:          5.7%-6.4%  Diabetes:              >6.4%  Glycemic control for   <7.0% adults with diabetes     CBG: Recent Labs  Lab 10/29/22 1600 10/29/22 1932 10/29/22 2324 10/30/22 0344 10/30/22 0745  GLUCAP 108* 92 114* 148* 138*       Critical care time:  45 minutes    CRITICAL CARE Performed by: Otilio Carpen    Total critical care time: 45 minutes  Critical care time was exclusive of separately billable procedures and treating other patients.  Critical care was necessary to treat or prevent imminent or life-threatening deterioration.  Critical care was time spent personally by me on the following activities: development of treatment plan with patient and/or surrogate as well as nursing, discussions with  consultants, evaluation of patient's response to treatment, examination of patient, obtaining history from patient or surrogate, ordering and performing treatments and interventions, ordering and review of laboratory studies, ordering and review of radiographic studies, pulse oximetry and re-evaluation of patient's condition.  Otilio Carpen , PA-C [facility] See Amion for pager If no response to pager , please call [phone] until 7pm After 7:00 pm call Elink  [phone]?[place]

## 2022-10-31 ENCOUNTER — Inpatient Hospital Stay (HOSPITAL_COMMUNITY): Payer: Medicaid Other

## 2022-10-31 DIAGNOSIS — R0902 Hypoxemia: Secondary | ICD-10-CM | POA: Diagnosis not present

## 2022-10-31 LAB — TRIGLYCERIDES: Triglycerides: 124 mg/dL (ref <150)

## 2022-10-31 LAB — BASIC METABOLIC PANEL
Anion gap: 7 (ref 5–15)
BUN: 36 mg/dL — ABNORMAL HIGH (ref 6–20)
CO2: 23 mmol/L (ref 22–32)
Calcium: 8.6 mg/dL — ABNORMAL LOW (ref 8.9–10.3)
Chloride: 115 mmol/L — ABNORMAL HIGH (ref 98–111)
Creatinine, Ser: 1.54 mg/dL — ABNORMAL HIGH (ref 0.44–1.00)
GFR, Estimated: 40 mL/min — ABNORMAL LOW (ref >60)
Glucose, Bld: 92 mg/dL (ref 70–99)
Potassium: 4.9 mmol/L (ref 3.5–5.1)
Sodium: 145 mmol/L (ref 135–145)

## 2022-10-31 LAB — CBC
HCT: 33.4 % — ABNORMAL LOW (ref 36.0–46.0)
Hemoglobin: 10.2 g/dL — ABNORMAL LOW (ref 12.0–15.0)
MCH: 28.1 pg (ref 26.0–34.0)
MCHC: 30.5 g/dL (ref 30.0–36.0)
MCV: 92 fL (ref 80.0–100.0)
Platelets: 186 10*3/uL (ref 150–400)
RBC: 3.63 MIL/uL — ABNORMAL LOW (ref 3.87–5.11)
RDW: 16.7 % — ABNORMAL HIGH (ref 11.5–15.5)
WBC: 6 10*3/uL (ref 4.0–10.5)
nRBC: 0.3 % — ABNORMAL HIGH (ref 0.0–0.2)

## 2022-10-31 LAB — GLUCOSE, CAPILLARY
Glucose-Capillary: 117 mg/dL — ABNORMAL HIGH (ref 70–99)
Glucose-Capillary: 85 mg/dL (ref 70–99)
Glucose-Capillary: 93 mg/dL (ref 70–99)
Glucose-Capillary: 131 mg/dL — ABNORMAL HIGH (ref 70–99)
Glucose-Capillary: 117 mg/dL — ABNORMAL HIGH (ref 70–99)
Glucose-Capillary: 94 mg/dL (ref 70–99)

## 2022-10-31 LAB — CULTURE, BLOOD (SINGLE)
Culture: NO GROWTH
Special Requests: ADEQUATE

## 2022-10-31 LAB — MAGNESIUM: Magnesium: 2.2 mg/dL (ref 1.7–2.4)

## 2022-10-31 MED ORDER — CLONAZEPAM 0.25 MG PO TBDP
1.0000 mg | ORAL_TABLET | Freq: Two times a day (BID) | ORAL | Status: DC
  Administered 2022-10-31 – 2022-11-08 (×17): 1 mg
  Filled 2022-10-31 (×2): qty 2
  Filled 2022-10-31 (×2): qty 4
  Filled 2022-10-31 (×2): qty 2
  Filled 2022-10-31: qty 4
  Filled 2022-10-31 (×4): qty 2
  Filled 2022-10-31: qty 4
  Filled 2022-10-31 (×3): qty 2
  Filled 2022-10-31: qty 4
  Filled 2022-10-31: qty 2

## 2022-10-31 MED ORDER — BUDESONIDE 0.25 MG/2ML IN SUSP
0.2500 mg | Freq: Two times a day (BID) | RESPIRATORY_TRACT | Status: DC
  Administered 2022-10-31 – 2022-11-02 (×4): 0.25 mg via RESPIRATORY_TRACT
  Filled 2022-10-31 (×4): qty 2

## 2022-10-31 MED ORDER — METOCLOPRAMIDE HCL 5 MG PO TABS
5.0000 mg | ORAL_TABLET | Freq: Four times a day (QID) | ORAL | Status: DC | PRN
  Administered 2022-10-31 – 2022-11-01 (×3): 5 mg
  Filled 2022-10-31 (×4): qty 1

## 2022-10-31 NOTE — Progress Notes (Signed)
Spoke with pt's cousin Crystal and updated her with patient's status  Sherry Baker Sherry Zanella, PA-C

## 2022-10-31 NOTE — Procedures (Signed)
Cortrak  Tube Type:  Cortrak - 43 inches Tube Location:  Left nare Secured by: Bridle Technique Used to Measure Tube Placement:  Marking at nare/corner of mouth Cortrak Secured At:  68 cm   Cortrak Tube Team Note:  Consult received to place a Cortrak feeding tube.   X-ray is required, abdominal x-ray has been ordered by the Cortrak team. Please confirm tube placement before using the Cortrak tube.   If the tube becomes dislodged please keep the tube and contact the Cortrak team at [email or web address] for replacement.  If after hours and replacement cannot be delayed, place a NG tube and confirm placement with an abdominal x-ray.   Koleen Distance MS, RD, LDN Please refer to [facility] for RD and/or RD on-call/weekend/after hours pager

## 2022-10-31 NOTE — Progress Notes (Signed)
NAME:  Sherry Baker, MRN:  [id number], DOB:  06-10-1966, LOS: 5 ADMISSION DATE:  10/25/2022, CONSULTATION DATE:  10/26/22 REFERRING MD:  Dr. Dayna Barker CHIEF COMPLAINT:  AMS   History of Present Illness:  Sherry Baker is a 56 year old woman with cirrhosis, polysubstance abuse, rectal cancer s/p iolostomy and bipolar disorder who presented to [facility] ER with altered mental status via EMS and [employer]. She had episodes of agitation in the waiting room. She was later found to be vomiting with emesis coming from her nose. She developed worsening respiratory status with hypoxia where she was placed on non-rebreather mask then required intubation.   PCCM called to admit patient.   Chest radiograph shows well positioned ET tube. New mid and lower left lung air space opacities superimposed on chronic interstitial thickening. Covid/Flu/Rsv negative.  Patient has prior positive MRSA screens this year. Urine culture from 06/2022 shows MDR klebsiella sensitive to zosyn.   Unable to obtain further history due to her being intubated.   Pertinent  Medical History   Past Medical History:  Diagnosis Date   Acquired hypothyroidism    Alcohol abuse    Allergy    PCNS swelling   Arthritis    Bipolar 1 disorder ([place])    Cancer ([place]) 01/21/2017   rectal cancer   Cancer ([place])    Chronic kidney disease    Chronic pain syndrome    Cirrhosis ([place])    Cirrhosis of liver (HCC)    Cocaine abuse ([place])    COVID-19 virus infection 06/23/2021   Delirium tremens ([place]) 06/22/2021   Depression    Genetic testing 03/24/2017   Ms. Acero underwent genetic counseling and testing for hereditary cancer syndromes on 02/17/2017. Her results were negative for mutations in all 46 genes analyzed by [facility]'s 46-gene Common Hereditary Cancers Panel. Genes analyzed include: APC, ATM, AXIN2, BARD1, BMPR1A, BRCA1, BRCA2, BRIP1, CDH1, CDKN2A, CHEK2, CTNNA1, DICER1, EPCAM, GREM1, HOXB13, KIT, MEN1, MLH1, MSH2, MSH3, MSH6, MUTYH,  NBN,   HTN (hypertension)    Hypertension    Polysubstance abuse ([place])    Rectal cancer ([place])    Suicidal ideation 02/11/2014   Significant Hospital Events: Including procedures, antibiotic start and stop dates in addition to other pertinent events   12/24 intubated, admitted to ICU 12/25 strep pneumo positive, trach culture moraxella catarrhalis, UC citrobacter freundii; on rocephin/azithro 12/26: remains intubated on prop 12/27 weaned 2hrs on PSV  12/28 extremely agitated and dyssynchronous with vent 12/29 better vent compliance, decreasing sedation  Interim History / Subjective:  strep pneumo positive, trach culture moraxella catarrhalis, UC citrobacter freundii;  Required deep sedation yesterday for agitation, dyssynchrony and air trapping   Objective   Blood pressure 102/69, pulse 79, temperature 98.6 F (37 C), temperature source Bladder, resp. rate 14, height 5' 2" (1.575 m), weight 48.5 kg, SpO2 98 %.    Vent Mode: PCV FiO2 (%):  [40 %-80 %] 40 % Set Rate:  [15 bmp-24 bmp] 15 bmp Vt Set:  [400 mL] 400 mL PEEP:  [8 cmH20-12 cmH20] 8 cmH20 Pressure Support:  [12 cmH20] 12 cmH20 Plateau Pressure:  [8 cmH20-22 cmH20] 22 cmH20   Intake/Output Summary (Last 24 hours) at 10/31/2022 0818 Last data filed at 10/31/2022 0500 Gross per 24 hour  Intake 2833.26 ml  Output 2082 ml  Net 751.26 ml    Filed Weights   10/29/22 0500 10/30/22 0450 10/31/22 0405  Weight: 47.3 kg 42.3 kg 48.5 kg   General:  chronically and critically  ill-appearing F mechanically ventilated, highly agitted  HEENT: MM pink/moist, sclera anicteric  Neuro: seen on precedex, kicking, trying to self-extubate CV: s1s2 rrr, no m/r/g PULM:  decreased air entry throughout  GI: soft, non-distended, ostomy without surrounding erythema, no supra-pubic fluid noted Extremities: warm/dry, no edema  Skin: no rashes or lesions    Labs reviewed Creatinine 1.5    Resolved Hospital Problem list      Assessment & Plan:     Acute Hypoxemic Respiratory Failure Aspiration Pneumonia vs HCAP/CAP COPD?: on anoro ellipta at home -much better air movement and synchrony today on propofol, precedex and fentanyl, got bronchodilators and steroids  -continue yupelri, brovana and pulmicort -will decrease sedation and RASS goal today -LTVV strategy with tidal volumes of 6-8 cc/kg ideal body weight -Wean PEEP/FiO2 for SpO2 >92% -VAP bundle in place -Daily SAT and SBT -PAD protocol in place -wean sedation for RASS goal 0 to -1 -duoneb q6 scheduled -continue rocephin for 5 days  Sepsis due to pneumonia vs UTI -strep pneumo positive, trach culture moraxella catarrhalis, UC citrobacter freundii -continue rocephin for 5 days, azithro stopped   Acute Toxic Metabolic Encephalopathy Alcohol Abuse, risk for alcohol withdrawal Polysubstance Use -alcohol level of 48 on arrival in ED - UDS positive for amphetamines and cocaine -completed phenobarb taper -likely will need extubated on precedex -start scheduled klonopin -cont thiamine, folic acid, mvi -monitor for signs of withdrawal   Acute Kidney Injury Creatinine stable 1.5, good UOP P: -continue to Trend BMP / urinary output -Replace electrolytes as indicated -Avoid nephrotoxic agents, ensure adequate renal perfusion  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 -small hole in suprapubic region leaking clear fluid -CT cystogram abd/pelvis shows 7mm fistula from the dome of the bladder and midline skin surface, will need urology follow up -cont ostomy bag over suprapubic region and continue to monitor for drainage  HTN -hold home BB in setting of cocaine use -prn hydralazine for htn  Rectal cancer s/p ileostomy -supportive care -ostomy in place  Hx of bipolar 1:  non compliant on meds -resume home zoloft and buspar  Elevated Troponin - in setting of sepsis, likely demand  Best Practice (right click and "Reselect all SmartList  Selections" daily)   Diet/type: tubefeeds, place cortrak DVT prophylaxis: prophylactic heparin  GI prophylaxis: H2B Lines: N/A Foley:  Yes, and it is still needed Code Status:  full code Last date of multidisciplinary goals of care discussion pending [cousin updated 12/28]  Labs   CBC: Recent Labs  Lab 10/25/22 2019 10/26/22 1031 10/27/22 0750 10/27/22 0819 10/28/22 1202 10/29/22 0149 10/30/22 0648 10/31/22 0701  WBC 5.9   < > 13.5*  --  12.7* 10.8* 5.1 6.0  NEUTROABS 4.4  --   --   --   --   --   --   --   HGB 13.0   < > 11.9* 10.5* 9.5* 9.9* 10.6* 10.2*  HCT 41.7   < > 38.5 31.0* 29.8* 30.6* 33.2* 33.4*  MCV 90.3   < > 92.1  --  88.7 88.7 89.5 92.0  PLT 233   < > 147*  --  178 183 183 186   < > = values in this interval not displayed.     Basic Metabolic Panel: Recent Labs  Lab 10/26/22 0929 10/26/22 1430 10/27/22 0750 10/27/22 0819 10/27/22 1310 10/27/22 1738 10/28/22 0512 10/28/22 1202 10/28/22 1609 10/29/22 0149 10/30/22 0855 10/31/22 0701  NA  --   --  139 140  --   --   --    141  --  145 145 145  K  --   --  4.7 4.3  --   --   --  3.4*  --  4.6 4.5 4.9  CL  --   --  108  --   --   --   --  115*  --  118* 116* 115*  CO2  --   --  20*  --   --   --   --  19*  --  19* 22 23  GLUCOSE  --   --  73  --   --   --   --  115*  --  123* 121* 92  BUN  --   --  21*  --   --   --   --  32*  --  34* 24* 36*  CREATININE  --   --  1.67*  --   --   --   --  1.62*  --  1.54* 1.53* 1.54*  CALCIUM  --   --  8.0*  --   --   --   --  8.1*  --  8.7* 8.7* 8.6*  MG   < >  --  1.8  --  2.0 2.0 2.0  --  2.2  --  2.1 2.2  PHOS  --  3.7  --   --  4.4 4.0 3.7  --  4.0  --   --   --    < > = values in this interval not displayed.    GFR: Estimated Creatinine Clearance: 32 mL/min (A) (by C-G formula based on SCr of 1.54 mg/dL (H)). Recent Labs  Lab 10/26/22 1330 10/27/22 0750 10/28/22 1202 10/29/22 0149 10/30/22 0648 10/31/22 0701  PROCALCITON 5.39  --   --   --   --   --    WBC 4.9   < > 12.7* 10.8* 5.1 6.0   < > = values in this interval not displayed.     Liver Function Tests: Recent Labs  Lab 10/25/22 2019 10/26/22 1330  AST 23 23  ALT 11 7  ALKPHOS 77 58  BILITOT 0.1* 0.4  PROT 8.1 5.9*  ALBUMIN 3.2* 2.1*    No results for input(s): "LIPASE", "AMYLASE" in the last 168 hours. No results for input(s): "AMMONIA" in the last 168 hours.  ABG    Component Value Date/Time   PHART 7.351 10/27/2022 0819   PCO2ART 32.1 10/27/2022 0819   PO2ART 139 (H) 10/27/2022 0819   HCO3 17.7 (L) 10/27/2022 0819   TCO2 19 (L) 10/27/2022 0819   ACIDBASEDEF 7.0 (H) 10/27/2022 0819   O2SAT 99 10/27/2022 0819     Coagulation Profile: Recent Labs  Lab 10/26/22 1330  INR 1.1     Cardiac Enzymes: No results for input(s): "CKTOTAL", "CKMB", "CKMBINDEX", "TROPONINI" in the last 168 hours.  HbA1C: Hgb A1c MFr Bld  Date/Time Value Ref Range Status  02/06/2022 06:34 AM 5.0 4.8 - 5.6 % Final    Comment:    (NOTE) Pre diabetes:          5.7%-6.4%  Diabetes:              >6.4%  Glycemic control for   <7.0% adults with diabetes   10/29/2021 07:27 AM 5.2 4.8 - 5.6 % Final    Comment:    (NOTE) Pre diabetes:          5.7%-6.4%  Diabetes:              >  6.4%  Glycemic control for   <7.0% adults with diabetes     CBG: Recent Labs  Lab 10/30/22 1532 10/30/22 2002 10/30/22 2336 10/31/22 0314 10/31/22 0811  GLUCAP 128* 139* 128* 117* 85       Critical care time:  35 minutes    CRITICAL CARE Performed by: Otilio Carpen Blake Vetrano   Total critical care time: 35 minutes  Critical care time was exclusive of separately billable procedures and treating other patients.  Critical care was necessary to treat or prevent imminent or life-threatening deterioration.  Critical care was time spent personally by me on the following activities: development of treatment plan with patient and/or surrogate as well as nursing, discussions with consultants,  evaluation of patient's response to treatment, examination of patient, obtaining history from patient or surrogate, ordering and performing treatments and interventions, ordering and review of laboratory studies, ordering and review of radiographic studies, pulse oximetry and re-evaluation of patient's condition.  Otilio Carpen Ethel Veronica, PA-C [facility] See Amion for pager If no response to pager , please call [phone] until 7pm After 7:00 pm call Elink  [phone]

## 2022-10-31 NOTE — Progress Notes (Signed)
RN walked into patients room at 0130. Patient had tan secretions from mouth on washcloth. Residuals checked and had 675 in residuals. E-link notified, awaiting orders.

## 2022-10-31 NOTE — Progress Notes (Signed)
Pt vomited tube feeds. Tube feeds stopped. Notified MD.   [name]

## 2022-10-31 NOTE — Progress Notes (Signed)
[place] Progress Note Patient Name: Sherry Baker DOB: 21-Mar-1966 MRN: [id number]   Date of Service  10/31/2022  HPI/Events of Note  After tube feeds restarted bedside RN noticed oral secretions that look like they are enteral feed tinged, patient also has > 500 ml residual.  eICU Interventions  Tube feeds paused and stat KUB ordered.        Kerry Kass Stephen Turnbaugh 10/31/2022, 1:53 AM

## 2022-10-31 NOTE — Progress Notes (Signed)
[place] Progress Note Patient Name: Sherry Baker DOB: 13-Feb-1966 MRN: [id number]   Date of Service  10/31/2022  HPI/Events of Note  KUB reviewed and unremarkable.  eICU Interventions  Enteral feeding resumed at 30 ml / hour with a plan to advance as tolerated, PRN iv Reglan ordered for dysmotility.        Kerry Kass Gwendloyn Forsee 10/31/2022, 5:49 AM

## 2022-10-31 NOTE — Progress Notes (Signed)
Attending:   Seen independently today, case discussed with Mickel Baas Gleason PA and we formulated the assessment and plan together.  Subjective: More synchronous with vent today sedated  Objective: Vitals:   10/31/22 1133 10/31/22 1145 10/31/22 1200 10/31/22 1215  BP: 121/79 125/79 121/81 112/75  Pulse: 67 65 70 71  Resp: '15 15 15 15  '$ Temp: (!) 96.8 F (36 C) (!) 96.8 F (36 C) (!) 96.8 F (36 C) (!) 96.8 F (36 C)  TempSrc:      SpO2: 100% 100% 100% 100%  Weight:      Height:       Vent Mode: PCV FiO2 (%):  [40 %] 40 % Set Rate:  [15 bmp] 15 bmp PEEP:  [5 cmH20-10 cmH20] 5 cmH20 Plateau Pressure:  [8 cmH20-22 cmH20] 22 cmH20  Intake/Output Summary (Last 24 hours) at 10/31/2022 1249 Last data filed at 10/31/2022 1221 Gross per 24 hour  Intake 2751.7 ml  Output 1747 ml  Net 1004.7 ml    General:  Chronically ill appearing, in bed on vent HENT: NCAT ETT in place PULM: CTA B, vent supported breathing CV: RRR, no mgr GI: BS+, soft, nontender MSK: normal bulk and tone Neuro: sedated on vent    CBC    Component Value Date/Time   WBC 6.0 10/31/2022 0701   RBC 3.63 (L) 10/31/2022 0701   HGB 10.2 (L) 10/31/2022 0701   HGB 12.4 01/30/2022 1145   HGB 9.7 (L) 10/07/2017 1335   HCT 33.4 (L) 10/31/2022 0701   HCT 39.6 01/30/2022 1145   HCT 31.5 (L) 10/07/2017 1335   PLT 186 10/31/2022 0701   PLT 295 01/30/2022 1145   MCV 92.0 10/31/2022 0701   MCV 84 01/30/2022 1145   MCV 74.2 (L) 10/07/2017 1335   MCH 28.1 10/31/2022 0701   MCHC 30.5 10/31/2022 0701   RDW 16.7 (H) 10/31/2022 0701   RDW 16.1 (H) 01/30/2022 1145   RDW 20.2 (H) 10/07/2017 1335   LYMPHSABS 0.8 10/25/2022 2019   LYMPHSABS 1.5 01/30/2022 1145   LYMPHSABS 2.2 10/07/2017 1335   MONOABS 0.4 10/25/2022 2019   MONOABS 1.0 (H) 10/07/2017 1335   EOSABS 0.1 10/25/2022 2019   EOSABS 0.1 01/30/2022 1145   BASOSABS 0.0 10/25/2022 2019   BASOSABS 0.0 01/30/2022 1145   BASOSABS 0.0 10/07/2017 1335     BMET    Component Value Date/Time   NA 145 10/31/2022 0701   NA 137 01/30/2022 1145   NA 133 (L) 04/22/2017 1512   K 4.9 10/31/2022 0701   K 3.9 04/22/2017 1512   CL 115 (H) 10/31/2022 0701   CO2 23 10/31/2022 0701   CO2 24 04/22/2017 1512   GLUCOSE 92 10/31/2022 0701   GLUCOSE 79 04/22/2017 1512   BUN 36 (H) 10/31/2022 0701   BUN 19 01/30/2022 1145   BUN 9.5 04/22/2017 1512   CREATININE 1.54 (H) 10/31/2022 0701   CREATININE 0.6 04/22/2017 1512   CALCIUM 8.6 (L) 10/31/2022 0701   CALCIUM 9.4 04/22/2017 1512   GFRNONAA 40 (L) 10/31/2022 0701   GFRAA 48 (L) 11/15/2020 1153    CXR images reviewed, interstitial infiltrate lower lobes, ett in place  Impression/Plan: COPD with acute exacerbation: solumedrol, brovana, pulmicort, yupelri, albuterol Acute respiratory failure with hypoxemia> Full mechanical vent support VAP prevention Daily WUA/SBT AKI > monitor UOP, BMET, consider lasix, monitor volume status Acute metabolic encephalopathy, severe vent dyssynchrony on 12/28 > continue high level sedation guided by PAD protocol  Prognosis guarded  My cc  time 31 minutes  Roselie Awkward, MD [place] [facility] Pager: [phone] Cell: [phone] After 7pm: [phone]

## 2022-11-01 DIAGNOSIS — R0902 Hypoxemia: Secondary | ICD-10-CM | POA: Diagnosis not present

## 2022-11-01 LAB — BASIC METABOLIC PANEL
Anion gap: 11 (ref 5–15)
BUN: 39 mg/dL — ABNORMAL HIGH (ref 6–20)
CO2: 23 mmol/L (ref 22–32)
Calcium: 9.1 mg/dL (ref 8.9–10.3)
Chloride: 112 mmol/L — ABNORMAL HIGH (ref 98–111)
Creatinine, Ser: 1.3 mg/dL — ABNORMAL HIGH (ref 0.44–1.00)
GFR, Estimated: 49 mL/min — ABNORMAL LOW (ref >60)
Glucose, Bld: 92 mg/dL (ref 70–99)
Potassium: 4.4 mmol/L (ref 3.5–5.1)
Sodium: 146 mmol/L — ABNORMAL HIGH (ref 135–145)

## 2022-11-01 LAB — GLUCOSE, CAPILLARY
Glucose-Capillary: 81 mg/dL (ref 70–99)
Glucose-Capillary: 92 mg/dL (ref 70–99)
Glucose-Capillary: 82 mg/dL (ref 70–99)
Glucose-Capillary: 101 mg/dL — ABNORMAL HIGH (ref 70–99)
Glucose-Capillary: 86 mg/dL (ref 70–99)
Glucose-Capillary: 92 mg/dL (ref 70–99)

## 2022-11-01 MED ORDER — ALBUTEROL SULFATE (2.5 MG/3ML) 0.083% IN NEBU: 2.5000 mg | INHALATION_SOLUTION | RESPIRATORY_TRACT | Status: DC | PRN

## 2022-11-01 MED ORDER — FREE WATER
200.0000 mL | Status: DC
  Administered 2022-11-01 – 2022-11-02 (×6): 200 mL

## 2022-11-01 MED ORDER — ACETAMINOPHEN 325 MG PO TABS
650.0000 mg | ORAL_TABLET | Freq: Four times a day (QID) | ORAL | Status: DC | PRN
  Administered 2022-11-01 – 2022-11-05 (×3): 650 mg
  Filled 2022-11-01 (×2): qty 2

## 2022-11-01 NOTE — Progress Notes (Signed)
NAME:  Sherry Baker, MRN:  [id number], DOB:  [birth year], LOS: 6 ADMISSION DATE:  10/25/2022, CONSULTATION DATE:  12/24 REFERRING MD:  Mesner, CHIEF COMPLAINT:  Confusion   History of Present Illness:  56 y/o female with a long history of cirrhosis and respiratory failure who presented on 12/24 with aspiration pneumonia and severe confusion/agitation with vomiting.  She was intubated in the ER then PCCM consulted.  Since admission she has developed worsening wheezing and air trapping on mechanical ventilation.  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    Pertinent  Medical History   Past Medical History:  Diagnosis Date   Acquired hypothyroidism    Alcohol abuse    Allergy    PCNS swelling   Arthritis    Bipolar 1 disorder ([place])    Cancer ([place]) 01/21/2017   rectal cancer   Cancer ([place])    Chronic kidney disease    Chronic pain syndrome    Cirrhosis ([place])    Cirrhosis of liver (HCC)    Cocaine abuse ([place])    COVID-19 virus infection 06/23/2021   Delirium tremens ([place]) 06/22/2021   Depression    Genetic testing 03/24/2017   Ms. Karn underwent genetic counseling and testing for hereditary cancer syndromes on 02/17/2017. Her results were negative for mutations in all 46 genes analyzed by [facility]'s 46-gene Common Hereditary Cancers Panel. Genes analyzed include: APC, ATM, AXIN2, BARD1, BMPR1A, BRCA1, BRCA2, BRIP1, CDH1, CDKN2A, CHEK2, CTNNA1, DICER1, EPCAM, GREM1, HOXB13, KIT, MEN1, MLH1, MSH2, MSH3, MSH6, MUTYH, NBN,   HTN (hypertension)    Hypertension    Polysubstance abuse ([place])    Rectal cancer ([place])    Suicidal ideation 02/11/2014    Significant Hospital Events: Including procedures, antibiotic start and stop dates in addition to other pertinent events   12/24 intubated, admitted to ICU 12/25 strep pneumo positive, trach culture moraxella catarrhalis, UC citrobacter freundii; on rocephin/azithro 12/26: remains intubated on  prop 12/27 weaned 2hrs on PSV  12/28 extremely agitated and dyssynchronous with vent 12/29 better vent compliance, decreasing sedation  Interim History / Subjective:  No acute events Calm on sedation Sedation  has been weaned down overnight  Objective   Blood pressure 110/66, pulse 95, temperature 100 F (37.8 C), resp. rate 17, height _0  (1.575 m), weight 50.4 kg, SpO2 98 %.    Vent Mode: PCV FiO2 (%):  [40 %] 40 % Set Rate:  [15 bmp] 15 bmp PEEP:  [5 cmH20] 5 cmH20 Plateau Pressure:  [8 cmH20-11 cmH20] 8 cmH20   Intake/Output Summary (Last 24 hours) at 11/01/2022 1032 Last data filed at 11/01/2022 7867 Gross per 24 hour  Intake 1465.61 ml  Output 1250 ml  Net 215.61 ml   Filed Weights   10/30/22 0450 10/31/22 0405 11/01/22 0328  Weight: 42.3 kg 48.5 kg 50.4 kg    Examination:  General:  chronically ill appearing, in bed on vent HENT: NCAT ETT in place PULM: CTA B, vent supported breathing CV: RRR, no mgr GI: BS+, soft, nontender MSK: normal bulk and tone Neuro: sedated on vent   Resolved Hospital Problem list     Assessment & Plan:  Acute hypoxemic respiratory failure  Aspiration pneumonia Presumable COPD or asthma with acute exacerbation> no baseline PFT Severe ventilator dyssynchrony 12/28 with wheezing, air trapping 12/30 appears improved Continue brovana/pulmicort/yupelri PSV today after weaning down fentanyl, maintain propofol and precedex due to severe agitaiton/anxiety May need to extubate on some level of  IV sedation given severe agitation Full mechanical vent support VAP prevention Daily WUA/SBT  Visiculocutaneous fistula, chronic Outpatient urology f/u Ostomy   Acute toxic metabolic encephalopathy Alcohol abuse Narcotic abuse Cocaine abuse Amphetamine abuse Anxiety Bipolar 1 Need for sedation for mechanical ventilation PAD Protocol Propofol, precedex fentanyl infusion Wean off fentanyl Maintain clonazepam, buspar,  sertraline Suspect will need   AKI > improved Chronic vesiculocutaneous fistula Urinary retention Monitor BMET and UOP Replace electrolytes as needed Free water foley  Demand ischemia Tele   Best Practice (right click and "Reselect all SmartList Selections" daily)   Diet/type: tubefeeds DVT prophylaxis: systemic heparin GI prophylaxis: H2B Lines: N/A Foley:  Yes, and it is still needed Code Status:  full code Last date of multidisciplinary goals of care discussion [12/30: cousin discussion with Goldye Tourangeau. She doesn't want long term mechanical ventilation but OK with short term ICU level care. ]  Critical care time: 40 minutes     Roselie Awkward, MD [place] [facility] Pager: [phone] Cell: [phone] After 7:00 pm call Elink  [phone]

## 2022-11-02 DIAGNOSIS — J9601 Acute respiratory failure with hypoxia: Secondary | ICD-10-CM | POA: Diagnosis not present

## 2022-11-02 LAB — GLUCOSE, CAPILLARY
Glucose-Capillary: 93 mg/dL (ref 70–99)
Glucose-Capillary: 87 mg/dL (ref 70–99)
Glucose-Capillary: 115 mg/dL — ABNORMAL HIGH (ref 70–99)
Glucose-Capillary: 119 mg/dL — ABNORMAL HIGH (ref 70–99)
Glucose-Capillary: 105 mg/dL — ABNORMAL HIGH (ref 70–99)
Glucose-Capillary: 90 mg/dL (ref 70–99)

## 2022-11-02 MED ORDER — BUDESONIDE 0.5 MG/2ML IN SUSP
0.5000 mg | Freq: Two times a day (BID) | RESPIRATORY_TRACT | Status: DC
  Administered 2022-11-02 – 2022-11-05 (×6): 0.5 mg via RESPIRATORY_TRACT
  Filled 2022-11-02 (×6): qty 2

## 2022-11-02 MED ORDER — LINEZOLID 600 MG/300ML IV SOLN
600.0000 mg | Freq: Two times a day (BID) | INTRAVENOUS | Status: AC
  Administered 2022-11-02 – 2022-11-06 (×10): 600 mg via INTRAVENOUS
  Filled 2022-11-02 (×11): qty 300

## 2022-11-02 MED ORDER — QUETIAPINE FUMARATE 25 MG PO TABS
25.0000 mg | ORAL_TABLET | Freq: Two times a day (BID) | ORAL | Status: DC
  Administered 2022-11-02 – 2022-11-03 (×3): 25 mg
  Filled 2022-11-02 (×3): qty 1

## 2022-11-02 MED ORDER — FREE WATER
150.0000 mL | Status: DC
  Administered 2022-11-02 – 2022-11-03 (×12): 150 mL

## 2022-11-02 MED ORDER — SODIUM CHLORIDE 0.9 % IV SOLN
2.0000 g | Freq: Two times a day (BID) | INTRAVENOUS | Status: DC
  Administered 2022-11-02 – 2022-11-05 (×7): 2 g via INTRAVENOUS
  Filled 2022-11-02 (×7): qty 12.5

## 2022-11-02 NOTE — Progress Notes (Signed)
RT note- attempted wean again, sedation remains on. Low minute ventilation, remains on full support.

## 2022-11-02 NOTE — Progress Notes (Signed)
NAME:  Sherry Baker, MRN:  [id number], DOB:  01-Nov-1966, LOS: 7 ADMISSION DATE:  10/25/2022, CONSULTATION DATE:  12/24 REFERRING MD:  Mesner, CHIEF COMPLAINT:  Confusion   History of Present Illness:  56 y/o female with a long history of cirrhosis and respiratory failure who presented on 12/24 with aspiration pneumonia and severe confusion/agitation with vomiting.  She was intubated in the ER then PCCM consulted.  Since admission she has developed worsening wheezing and air trapping on mechanical ventilation.  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    Pertinent  Medical History   Past Medical History:  Diagnosis Date   Acquired hypothyroidism    Alcohol abuse    Allergy    PCNS swelling   Arthritis    Bipolar 1 disorder ([employer])    Cancer ([place]) 01/21/2017   rectal cancer   Cancer ([place])    Chronic kidney disease    Chronic pain syndrome    Cirrhosis ([place])    Cirrhosis of liver (H[facility])    Cocaine abuse ([place])    COVID-19 virus infection 06/23/2021   Delirium tremens ([place]) 06/22/2021   Depression    Genetic testing 03/24/2017   Sherry Baker underwent genetic counseling and testing for hereditary cancer syndromes on 02/17/2017. Her results were negative for mutations in all 46 genes analyzed by [facility]'s 46-gene Common Hereditary Cancers Panel. Genes analyzed include: APC, ATM, AXIN2, BARD1, BMPR1A, BRCA1, BRCA2, BRIP1, CDH1, CDKN2A, CHEK2, CTNNA1, DICER1, EPCAM, GREM1, HOXB13, KIT, MEN1, MLH1, MSH2, MSH3, MSH6, MUTYH, NBN,   HTN (hypertension)    Hypertension    Polysubstance abuse ([place])    Rectal cancer ([place])    Suicidal ideation 02/11/2014    Significant Hospital Events: Including procedures, antibiotic start and stop dates in addition to other pertinent events   12/24 intubated, admitted to ICU 12/25 strep pneumo positive, trach culture moraxella catarrhalis, UC citrobacter freundii; on rocephin/azithro 12/26: remains intubated on  prop 12/27 weaned 2hrs on PSV  12/28 extremely agitated and dyssynchronous with vent 12/29 better vent compliance, decreasing sedation  Interim History / Subjective:  Weaned for 1 hr this morning but ongoing agitation on precedex, propofol, fentanyl Increased ETT secretions Tmax 101.5  Objective   Blood pressure 114/65, pulse 82, temperature 99.1 F (37.3 C), temperature source Bladder, resp. rate 13, height _0  (1.575 m), weight 48.4 kg, SpO2 100 %.    Vent Mode: PCV FiO2 (%):  [40 %] 40 % Set Rate:  [15 bmp] 15 bmp PEEP:  [5 cmH20] 5 cmH20 Pressure Support:  [8 cmH20] 8 cmH20 Plateau Pressure:  [11 cmH20-14 cmH20] 11 cmH20   Intake/Output Summary (Last 24 hours) at 11/02/2022 1131 Last data filed at 11/02/2022 1000 Gross per 24 hour  Intake 1910.19 ml  Output 1725 ml  Net 185.19 ml   Filed Weights   10/31/22 0405 11/01/22 0328 11/02/22 0330  Weight: 48.5 kg 50.4 kg 48.4 kg    Examination: Precedex 0.6, propofol 20, fentanyl 75 General:  chronically ill appearing thin female lying in bed in NAD HEENT: MM pink/moist, ETT, lcortrak, pupils 3/reactive, left eye lid abrasion/ ecchymosis  Neuro:  opens eyes, agitated, MAE, not f/c, does not track CV: rr, NSR, no murmur PULM:  MV supported breaths, coarse scattered rhonchi with mild amount of thick tan secretions, no wheeze GI: soft, bs+, ostomy with formed stool left abd, ostomy, foley Extremities: warm/dry, no LE edema  Skin: scattered ecchymosis to BUE  No labs  101.5 tmax last evening UOP 1.3L/ 24 hrs  Net +5.5  Resolved Hospital Problem list     Assessment & Plan:  Acute hypoxemic respiratory failure  Aspiration pneumonia Presumable COPD or asthma with acute exacerbation> no baseline PFT Severe ventilator dyssynchrony 12/28 with wheezing, air trapping R/o HCAP  - cont PCV ventilation with daily WUA/ SBT trials  - wean PEEP/ FiO2 for sat goal > 92% - cont prn albuterol, brovanna, pulmicort, yupelri - PAD  protocol > maximize precedex, low dose propofol as needed, and minimize fentanyl.  Continue enteral klonopin and will add seroquel - completed ceftriaxone 12/30, still having fevers and increased secretions.  Resend trach asp and MRSA PCR and start cefepime and linezolid (given sCr and monitor for serotonin syndrome - CXR in am   Visiculocutaneous fistula, chronic Outpatient urology f/u Ostomy   Acute toxic metabolic encephalopathy Alcohol abuse Narcotic abuse Cocaine abuse Amphetamine abuse Anxiety Bipolar 1 Need for sedation for mechanical ventilation - PAD Protocol as above, adding Seroquel today  - cont clonazepam, buspar, sertraline  AKI > improved Chronic vesiculocutaneous fistula Urinary retention - cont to monitor UOP and renal indices - cont foley - increase FWF - Replace electrolytes as indicated - Avoid nephrotoxic agents, ensure adequate renal perfusion  Demand ischemia - Telemetry monitoring    Best Practice (right click and "Reselect all SmartList Selections" daily)   Diet/type: tubefeeds DVT prophylaxis: systemic heparin GI prophylaxis: H2B Lines: N/A Foley:  Yes, and it is still needed Code Status:  full code Last date of multidisciplinary goals of care discussion [12/30: cousin discussion with Sherry Baker. She doesn't want long term mechanical ventilation but OK with short term ICU level care. ] Pending update 12/31.  No family at bedside  Critical care time: 54 minutes      Sherry Baker, [name] 11/02/2022, 12:04 PM  See Amion for pager If no response to pager, please call PCCM consult pager After 7:00 pm call Elink

## 2022-11-02 NOTE — Progress Notes (Signed)
Attending:    Subjective: No labs this morning Some agitation this morning  Secretions are thick, copius, fever overnight  Objective: Vitals:   11/02/22 0900 11/02/22 0941 11/02/22 1000 11/02/22 1100  BP: 99/61  116/72 123/73  Pulse: 86  82 78  Resp: (!) '9  16 16  '$ Temp: 100.2 F (37.9 C)  99 F (37.2 C) 99.3 F (37.4 C)  TempSrc:      SpO2: 100% 100% 100% 100%  Weight:      Height:       Vent Mode: PCV FiO2 (%):  [40 %] 40 % Set Rate:  [15 bmp] 15 bmp Vt Set:  [400 mL] 400 mL PEEP:  [5 cmH20] 5 cmH20 Pressure Support:  [8 cmH20] 8 cmH20 Plateau Pressure:  [11 cmH20-14 cmH20] 11 cmH20  Intake/Output Summary (Last 24 hours) at 11/02/2022 1153 Last data filed at 11/02/2022 1000 Gross per 24 hour  Intake 1740.98 ml  Output 1725 ml  Net 15.98 ml    General:  In bed on vent HENT: NCAT ETT in place PULM: CTA B, vent supported breathing CV: RRR, no mgr GI: BS+, soft, nontender MSK: normal bulk and tone Neuro: sedated on vent   CBC    Component Value Date/Time   WBC 6.0 10/31/2022 0701   RBC 3.63 (L) 10/31/2022 0701   HGB 10.2 (L) 10/31/2022 0701   HGB 12.4 01/30/2022 1145   HGB 9.7 (L) 10/07/2017 1335   HCT 33.4 (L) 10/31/2022 0701   HCT 39.6 01/30/2022 1145   HCT 31.5 (L) 10/07/2017 1335   PLT 186 10/31/2022 0701   PLT 295 01/30/2022 1145   MCV 92.0 10/31/2022 0701   MCV 84 01/30/2022 1145   MCV 74.2 (L) 10/07/2017 1335   MCH 28.1 10/31/2022 0701   MCHC 30.5 10/31/2022 0701   RDW 16.7 (H) 10/31/2022 0701   RDW 16.1 (H) 01/30/2022 1145   RDW 20.2 (H) 10/07/2017 1335   LYMPHSABS 0.8 10/25/2022 2019   LYMPHSABS 1.5 01/30/2022 1145   LYMPHSABS 2.2 10/07/2017 1335   MONOABS 0.4 10/25/2022 2019   MONOABS 1.0 (H) 10/07/2017 1335   EOSABS 0.1 10/25/2022 2019   EOSABS 0.1 01/30/2022 1145   BASOSABS 0.0 10/25/2022 2019   BASOSABS 0.0 01/30/2022 1145   BASOSABS 0.0 10/07/2017 1335    BMET    Component Value Date/Time   NA 146 (H) 11/01/2022 0634   NA  137 01/30/2022 1145   NA 133 (L) 04/22/2017 1512   K 4.4 11/01/2022 0634   K 3.9 04/22/2017 1512   CL 112 (H) 11/01/2022 0634   CO2 23 11/01/2022 0634   CO2 24 04/22/2017 1512   GLUCOSE 92 11/01/2022 0634   GLUCOSE 79 04/22/2017 1512   BUN 39 (H) 11/01/2022 0634   BUN 19 01/30/2022 1145   BUN 9.5 04/22/2017 1512   CREATININE 1.30 (H) 11/01/2022 0634   CREATININE 0.6 04/22/2017 1512   CALCIUM 9.1 11/01/2022 0634   CALCIUM 9.4 04/22/2017 1512   GFRNONAA 49 (L) 11/01/2022 0634   GFRAA 48 (L) 11/15/2020 1153      Impression Aspiration pneumonia Acute respiratory failure with hypoxemic respiratory failure New fever overnight, worsening secretions, high risk for HCAP Severe agitation/ICU delirium with underlying alcohol and narcotic dependence Not ready for extubation due to agitation, secretions  Plan: Add seroquel Reculture Change antibiotics to linezolid/cefepime Stop ceftriaxone Full mechanical vent support VAP prevention Daily WUA/SBT Continue bronchodilators Hopeful for extubation next 24-48 hours  My cc time 24 minutes  Roselie Awkward, MD [place] [facility] Pager: [phone] Cell: [phone] After 7pm: [phone]

## 2022-11-03 ENCOUNTER — Inpatient Hospital Stay (HOSPITAL_COMMUNITY): Payer: Medicaid Other

## 2022-11-03 DIAGNOSIS — J9601 Acute respiratory failure with hypoxia: Secondary | ICD-10-CM | POA: Diagnosis not present

## 2022-11-03 LAB — GLUCOSE, CAPILLARY
Glucose-Capillary: 93 mg/dL (ref 70–99)
Glucose-Capillary: 106 mg/dL — ABNORMAL HIGH (ref 70–99)
Glucose-Capillary: 106 mg/dL — ABNORMAL HIGH (ref 70–99)
Glucose-Capillary: 107 mg/dL — ABNORMAL HIGH (ref 70–99)
Glucose-Capillary: 105 mg/dL — ABNORMAL HIGH (ref 70–99)
Glucose-Capillary: 115 mg/dL — ABNORMAL HIGH (ref 70–99)

## 2022-11-03 LAB — TRIGLYCERIDES: Triglycerides: 176 mg/dL — ABNORMAL HIGH (ref <150)

## 2022-11-03 LAB — BASIC METABOLIC PANEL
Anion gap: 9 (ref 5–15)
BUN: 47 mg/dL — ABNORMAL HIGH (ref 6–20)
CO2: 28 mmol/L (ref 22–32)
Calcium: 8.9 mg/dL (ref 8.9–10.3)
Chloride: 98 mmol/L (ref 98–111)
Creatinine, Ser: 1.26 mg/dL — ABNORMAL HIGH (ref 0.44–1.00)
GFR, Estimated: 51 mL/min — ABNORMAL LOW (ref >60)
Glucose, Bld: 95 mg/dL (ref 70–99)
Potassium: 4.7 mmol/L (ref 3.5–5.1)
Sodium: 135 mmol/L (ref 135–145)

## 2022-11-03 LAB — CBC
HCT: 33 % — ABNORMAL LOW (ref 36.0–46.0)
Hemoglobin: 10.3 g/dL — ABNORMAL LOW (ref 12.0–15.0)
MCH: 27.9 pg (ref 26.0–34.0)
MCHC: 31.2 g/dL (ref 30.0–36.0)
MCV: 89.4 fL (ref 80.0–100.0)
Platelets: 226 10*3/uL (ref 150–400)
RBC: 3.69 MIL/uL — ABNORMAL LOW (ref 3.87–5.11)
RDW: 15.3 % (ref 11.5–15.5)
WBC: 4.9 10*3/uL (ref 4.0–10.5)
nRBC: 0 % (ref 0.0–0.2)

## 2022-11-03 MED ORDER — QUETIAPINE FUMARATE 50 MG PO TABS
50.0000 mg | ORAL_TABLET | Freq: Two times a day (BID) | ORAL | Status: DC
  Administered 2022-11-03 – 2022-11-08 (×10): 50 mg
  Filled 2022-11-03 (×10): qty 1

## 2022-11-03 MED ORDER — QUETIAPINE FUMARATE 25 MG PO TABS
25.0000 mg | ORAL_TABLET | Freq: Once | ORAL | Status: AC
  Administered 2022-11-03: 25 mg
  Filled 2022-11-03: qty 1

## 2022-11-03 MED ORDER — MIDAZOLAM HCL 2 MG/2ML IJ SOLN: 1.0000 mg | INTRAMUSCULAR | Status: DC | PRN

## 2022-11-03 NOTE — Progress Notes (Signed)
 NAME:  Sherry Baker, MRN:  [id number], DOB:  03/26/1966, LOS: 8 ADMISSION DATE:  10/25/2022, CONSULTATION DATE:  12/24 REFERRING MD:  Mesner, CHIEF COMPLAINT:  Confusion   History of Present Illness:  57 y/o female with a long history of cirrhosis and respiratory failure who presented on 12/24 with aspiration pneumonia and severe confusion/agitation with vomiting.  She was intubated in the ER then PCCM consulted.  Since admission she has developed worsening wheezing and air trapping on mechanical ventilation.  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    Pertinent  Medical History   Past Medical History:  Diagnosis Date   Acquired hypothyroidism    Alcohol abuse    Allergy    PCNS swelling   Arthritis    Bipolar 1 disorder ([facility])    Cancer ([facility]) 01/21/2017   rectal cancer   Cancer ([facility])    Chronic kidney disease    Chronic pain syndrome    Cirrhosis (HCC)    Cirrhosis of liver (HCC)    Cocaine abuse (H[facility])    COVID-19 virus infection 06/23/2021   Delirium tremens (HCC) 06/22/2021   Depression    Genetic testing 03/24/2017   Ms. Tucciarone underwent genetic counseling and testing for hereditary cancer syndromes on 02/17/2017. Her results were negative for mutations in all 46 genes analyzed by [facility]'s 46-gene Common Hereditary Cancers Panel. Genes analyzed include: APC, ATM, AXIN2, BARD1, BMPR1A, BRCA1, BRCA2, BRIP1, CDH1, CDKN2A, CHEK2, CTNNA1, DICER1, EPCAM, GREM1, HOXB13, KIT, MEN1, MLH1, MSH2, MSH3, MSH6, MUTYH, NBN,   HTN (hypertension)    Hypertension    Polysubstance abuse (H[facility])    Rectal cancer (H[facility])    Suicidal ideation 02/11/2014    Significant Hospital Events: Including procedures, antibiotic start and stop dates in addition to other pertinent events   12/24 intubated, admitted to ICU 12/25 strep pneumo positive, trach culture moraxella catarrhalis, UC citrobacter freundii; on rocephin/azithro 12/26: remains intubated on  prop 12/27 weaned 2hrs on PSV  12/28 extremely agitated and dyssynchronous with vent 12/29 better vent compliance, decreasing sedation 12/31 febrile, increased secretions, sputum cx/ abx restarted  Interim History / Subjective:  Intermittent secretions, better Afebrile Still intermittently agitated vs out on precedex/ fentanyl gtt/ propofol getting prn versed overnight   Objective   Blood pressure 123/75, pulse 70, temperature 98.4 F (36.9 C), resp. rate 19, height 5' 2" (1.575 m), weight 47.6 kg, SpO2 100 %.    Vent Mode: PCV FiO2 (%):  [40 %] 40 % Set Rate:  [15 bmp] 15 bmp Vt Set:  [400 mL] 400 mL PEEP:  [5 cmH20] 5 cmH20 Plateau Pressure:  [11 cmH20-12 cmH20] 12 cmH20   Intake/Output Summary (Last 24 hours) at 11/03/2022 0749 Last data filed at 11/03/2022 0600 Gross per 24 hour  Intake 2877.76 ml  Output 3400 ml  Net -522.24 ml   Filed Weights   11/01/22 0328 11/02/22 0330 11/03/22 0338  Weight: 50.4 kg 48.4 kg 47.6 kg    Examination: Precedex 0.8, fent gtt 50, propofol 20 General:  chronically ill thin older female sitting upright in bed in NAD HEENT: MM pink/moist, pupils 4/reactive, anicteric, cortrak, ETT Neuro: opens eyes to verbal, questionably follows intermittent commands, still agitated and restless in bed, frequent coughing when stimulated, left eye ecchymosis and healing abrasion  CV: rr PULM:  MV on PCV with TV's 7-8cc/kg, diffuse rhonchi with moderate amount of yellowish secretions, no wheeze GI: soft, bs+, L abd ostomy with semi-formed greenish   stool, R ostomy, foley- cyu  Extremities: warm/dry, no LE edema  Skin: no rashes   Afebrile UOP 3.2L/ 24hrs Net +5.1L Trach asp 12/31> GPCs>  MRSA PCR>  Labs reviewed, sCr stable, WBC 4.9, Hgb 10.3 CXR > stable ETT/ GT, residual LLL retrocardiac opacity, increasing patchy RUL opacity  Resolved Hospital Problem list     Assessment & Plan:  Acute hypoxemic respiratory failure  Aspiration  pneumonia Presumable COPD or asthma with acute exacerbation> no baseline PFT Severe ventilator dyssynchrony 12/28 with wheezing, air trapping R/o HCAP  - PAD protocol> maximize precedex, stop fent gtt, propofol cont and increase seroquel to 31m BID (Qtc 447) in attempt for rapid wean/ extubation today - cont PCV ventilation given previous dyssynchrony, volumes good - cont prn albuterol, brovanna, pulmicort, yupelri - cont cefepime/ linezolid for now (previous MRSA PCR +) - trend WBC/ fever curve - follow repeat tracheal asp  - intermittent CXR - aggressive pulm hygiene   Visiculocutaneous fistula, chronic Outpatient urology f/u Ostomy   Acute toxic metabolic encephalopathy Alcohol abuse Narcotic abuse Cocaine abuse Amphetamine abuse Anxiety Bipolar 1 Need for sedation for mechanical ventilation - PAD Protocol as above - cont clonazepam, buspar, sertraline (monitor for serotonin syndrome while on linezolid)  AKI > improved Chronic vesiculocutaneous fistula Urinary retention - cont to monitor UOP and renal indices> stable - cont foley for now, if extubated, re-evaluate - stop FWF- Na 135 - Replace electrolytes as indicated - Avoid nephrotoxic agents, ensure adequate renal perfusion  Demand ischemia - Telemetry monitoring    Best Practice (right click and "Reselect all SmartList Selections" daily)   Diet/type: tubefeeds- cortrak DVT prophylaxis: systemic heparin GI prophylaxis: H2B Lines: N/A Foley:  Yes, and it is still needed Code Status:  full code Last date of multidisciplinary goals of care discussion [12/30: cousin discussion with [place]. She doesn't want long term mechanical ventilation but OK with short term ICU level care. ] Pending update 1/1.  No family at bedside  Critical care time: 36 minutes     BAthalia ADanvillePulmonary & Critical Care 11/03/2022, 7:49 AM  See Amion for pager If no response to pager, please call PCCM consult  pager After 7:00 pm call Elink

## 2022-11-03 NOTE — Procedures (Signed)
Extubation Procedure Note  Patient Details:   Name: Sherry Baker DOB: 1966/10/29 MRN: [id number]   Airway Documentation:    Vent end date: 11/03/22 Vent end time: 1203   Evaluation  O2 sats: stable throughout Complications: No apparent complications Patient did tolerate procedure well. Bilateral Breath Sounds: Rhonchi   Yes  Patient extubated per order to 4L [place] with no apparent complications. Positive cuff leak was noted prior to extubation. Patient has strong cough and is able to weakly speak. Vitals are stable. RT will continue to monitor.   Findley Blankenbaker Clyda Greener 11/03/2022, 12:07 PM

## 2022-11-03 NOTE — Progress Notes (Signed)
Attending:    Subjective: Passing SBT Intermittently   Objective: Vitals:   11/03/22 0900 11/03/22 1000 11/03/22 1100 11/03/22 1103  BP: 121/69 132/75 131/82   Pulse: 71 67 71   Resp: '15 15 16   '$ Temp: 98.8 F (37.1 C) 98.4 F (36.9 C) 98.6 F (37 C)   TempSrc:      SpO2: 100% 100% 100% 100%  Weight:      Height:       Vent Mode: PSV;CPAP FiO2 (%):  [40 %] 40 % Set Rate:  [15 bmp] 15 bmp PEEP:  [5 cmH20] 5 cmH20 Pressure Support:  [10 cmH20] 10 cmH20 Plateau Pressure:  [11 cmH20-16 cmH20] 16 cmH20  Intake/Output Summary (Last 24 hours) at 11/03/2022 1156 Last data filed at 11/03/2022 1000 Gross per 24 hour  Intake 3383.49 ml  Output 4100 ml  Net -716.51 ml    General:  In bed on vent HENT: NCAT ETT in place PULM: CTA B, vent supported breathing CV: RRR, no mgr GI: BS+, soft, nontender MSK: normal bulk and tone Neuro: awake on vent, reaching for tube, agitated   CBC    Component Value Date/Time   WBC 4.9 11/03/2022 0808   RBC 3.69 (L) 11/03/2022 0808   HGB 10.3 (L) 11/03/2022 0808   HGB 12.4 01/30/2022 1145   HGB 9.7 (L) 10/07/2017 1335   HCT 33.0 (L) 11/03/2022 0808   HCT 39.6 01/30/2022 1145   HCT 31.5 (L) 10/07/2017 1335   PLT 226 11/03/2022 0808   PLT 295 01/30/2022 1145   MCV 89.4 11/03/2022 0808   MCV 84 01/30/2022 1145   MCV 74.2 (L) 10/07/2017 1335   MCH 27.9 11/03/2022 0808   MCHC 31.2 11/03/2022 0808   RDW 15.3 11/03/2022 0808   RDW 16.1 (H) 01/30/2022 1145   RDW 20.2 (H) 10/07/2017 1335   LYMPHSABS 0.8 10/25/2022 2019   LYMPHSABS 1.5 01/30/2022 1145   LYMPHSABS 2.2 10/07/2017 1335   MONOABS 0.4 10/25/2022 2019   MONOABS 1.0 (H) 10/07/2017 1335   EOSABS 0.1 10/25/2022 2019   EOSABS 0.1 01/30/2022 1145   BASOSABS 0.0 10/25/2022 2019   BASOSABS 0.0 01/30/2022 1145   BASOSABS 0.0 10/07/2017 1335    BMET    Component Value Date/Time   NA 135 11/03/2022 0808   NA 137 01/30/2022 1145   NA 133 (L) 04/22/2017 1512   K 4.7 11/03/2022  0808   K 3.9 04/22/2017 1512   CL 98 11/03/2022 0808   CO2 28 11/03/2022 0808   CO2 24 04/22/2017 1512   GLUCOSE 95 11/03/2022 0808   GLUCOSE 79 04/22/2017 1512   BUN 47 (H) 11/03/2022 0808   BUN 19 01/30/2022 1145   BUN 9.5 04/22/2017 1512   CREATININE 1.26 (H) 11/03/2022 0808   CREATININE 0.6 04/22/2017 1512   CALCIUM 8.9 11/03/2022 0808   CALCIUM 9.4 04/22/2017 1512   GFRNONAA 51 (L) 11/03/2022 0808   GFRAA 48 (L) 11/15/2020 1153     Impression  Severe ICU delirium Acute respiratory failure with hypoxemia COPD presumed Wheezing, presumed COPD exacerbation Aspiration pneumonia moraxella Protein calorie malnutrition Anxiety Narcotic dependence EtOH withdrawal Chronic vesiculocutaneous fistula  Plan: Extubate today, leave precedex to help manage agitation Prn versed Continue brovana/pulmicort/yupelri Contiue linezolid/cefepime Aspiration precautions Clonazepam for anxiety  My cc time 35 minutes  Roselie Awkward, MD [place] PCCM Pager: [phone] Cell: [phone] After 7pm: [phone]

## 2022-11-04 DIAGNOSIS — R0902 Hypoxemia: Secondary | ICD-10-CM | POA: Diagnosis not present

## 2022-11-04 LAB — BASIC METABOLIC PANEL
Anion gap: 11 (ref 5–15)
BUN: 43 mg/dL — ABNORMAL HIGH (ref 6–20)
CO2: 21 mmol/L — ABNORMAL LOW (ref 22–32)
Calcium: 9 mg/dL (ref 8.9–10.3)
Chloride: 104 mmol/L (ref 98–111)
Creatinine, Ser: 1.23 mg/dL — ABNORMAL HIGH (ref 0.44–1.00)
GFR, Estimated: 52 mL/min — ABNORMAL LOW (ref >60)
Glucose, Bld: 122 mg/dL — ABNORMAL HIGH (ref 70–99)
Potassium: 4.7 mmol/L (ref 3.5–5.1)
Sodium: 136 mmol/L (ref 135–145)

## 2022-11-04 LAB — GLUCOSE, CAPILLARY
Glucose-Capillary: 95 mg/dL (ref 70–99)
Glucose-Capillary: 113 mg/dL — ABNORMAL HIGH (ref 70–99)
Glucose-Capillary: 133 mg/dL — ABNORMAL HIGH (ref 70–99)
Glucose-Capillary: 115 mg/dL — ABNORMAL HIGH (ref 70–99)
Glucose-Capillary: 125 mg/dL — ABNORMAL HIGH (ref 70–99)
Glucose-Capillary: 195 mg/dL — ABNORMAL HIGH (ref 70–99)

## 2022-11-04 MED ORDER — ORAL CARE MOUTH RINSE: 15.0000 mL | OROMUCOSAL | Status: DC | PRN

## 2022-11-04 MED ORDER — POLYETHYLENE GLYCOL 3350 17 G PO PACK: 17.0000 g | PACK | Freq: Every day | ORAL | Status: DC | PRN

## 2022-11-04 MED ORDER — DOCUSATE SODIUM 50 MG/5ML PO LIQD: 100.0000 mg | Freq: Two times a day (BID) | ORAL | Status: DC | PRN

## 2022-11-04 MED ORDER — CHLORHEXIDINE GLUCONATE 0.12 % MT SOLN
15.0000 mL | Freq: Once | OROMUCOSAL | Status: AC
  Administered 2022-11-04: 15 mL via OROMUCOSAL
  Filled 2022-11-04: qty 15

## 2022-11-04 NOTE — TOC Progression Note (Signed)
Transition of Care [facility]) - Progression Note    Patient Details  Name: Sherry Baker MRN: [id number] Date of Birth: [birth year]  Transition [facility] CM/SW Contact  Tom-Johnson, Renea Ee, RN Phone Number: 11/04/2022, 1:09 PM  Clinical Narrative:     Patient extubated yesterday 01/01, currently on 4L O2. CM called and spoke with Crystal, patient's cousin about discharge needs. PT recommended SNF. Patient is oriented to self, not able to participate at this time. Patient is from home with her friend, [name]. Crystal states she lives with [name] in a one bedroom apartment with five other people. States patient has easy access to drugs and alcohol. Patient has a Colostomy and gets her supplies from [employer]. Crystal states patient's health is deteriorating where she can't take care of her self like she use to. Patient uses Medicaid transportation and also uses bus voucher for public bus transportation.  CM notified Crystal of SNF recommendation and is in agreement but concerned if patient will agree to SNF. Medical care is ongoing at this time.  TOC will continue to follow as patient progresses with care towards discharge.      Expected Discharge Plan: [place] Barriers to Discharge: Continued Medical Work up  Expected Discharge Plan and Services In-house Referral: Clinical Social Work Discharge Planning Services: CM Consult Post Acute Care Choice: [place] Living arrangements for the past 2 months: Apartment                                       Social Determinants of Health (SDOH) Interventions SDOH Screenings   Depression (PHQ2-9): High Risk (01/30/2022)  Tobacco Use: High Risk (07/18/2022)    Readmission Risk Interventions    06/16/2022    4:54 PM 04/17/2022   11:21 AM 12/10/2021    1:58 PM  Readmission Risk Prevention Plan  Transportation Screening Complete Complete Complete  Medication Review Press photographer)  Complete Complete   PCP or Specialist appointment within 3-5 days of discharge Not Complete Complete Complete  PCP/Specialist Appt Not Complete comments patient needs to make apt    [place] or Home Care Consult Complete Complete Not Complete  HRI or Home Care Consult Pt Refusal Comments   history of unsafe home environment  SW Recovery Care/Counseling Consult  Complete Complete  Palliative Care Screening  Not Applicable Not [place] Patient Refused Patient Refused Not Applicable

## 2022-11-04 NOTE — Evaluation (Signed)
Physical Therapy Evaluation Patient Details Name: Sherry Baker MRN: [id number] DOB: October 25, 1966 Today's Date: 11/04/2022  History of Present Illness  57 yo female under arrest by [employer] 10/25/22 when developed AMS and EMS called. In ED pt with agitation and aspiration PNA requiring intubation 12/24. Extubated 11/03/22. PMhx: COPD, anxiety, ETOH withdrawal, narcotic dependence, depression, bipolar, rectal CA, iliostomy, CKD, cirrhosis, HTN, vesiculocutaneous fistula  Clinical Impression  Pt restless, impulsive and oriented only to self. Pt reports living with a friend in a house and using a walker. Pt currently unable to tolerate significant weight on RLE making standing challenging and unable to take steps away from surface. Pt impulsively sitting and leaning posterior with mobility and requires +2 assist for safety. Pt with decreased strength, cognition, mobility and function who will benefit from acute therapy to maximize mobility to decrease burden of care.         Recommendations for follow up therapy are one component of a multi-disciplinary discharge planning process, led by the attending physician.  Recommendations may be updated based on patient status, additional functional criteria and insurance authorization.  Follow Up Recommendations Skilled nursing-short term rehab (<3 hours/day) Can patient physically be transported by private vehicle: No    Assistance Recommended at Discharge Frequent or constant Supervision/Assistance  Patient can return home with the following  A lot of help with walking and/or transfers;A lot of help with bathing/dressing/bathroom;Assistance with cooking/housework;Direct supervision/assist for medications management;Assist for transportation;Direct supervision/assist for financial management    Equipment Recommendations None recommended by PT  Recommendations for Other Services       Functional Status Assessment Patient has had a recent decline in their  functional status and/or demonstrates limited ability to make significant improvements in function in a reasonable and predictable amount of time     Precautions / Restrictions Precautions Precautions: Fall;Other (comment) Precaution Comments: cortrak, bil wrist restraints and mittens Restrictions Weight Bearing Restrictions: No      Mobility  Bed Mobility Overal bed mobility: Needs Assistance Bed Mobility: Supine to Sit, Sit to Supine     Supine to sit: Min guard     General bed mobility comments: guarding for lines and safety, pt able to transition to and from sitting without physical assist, impulsive    Transfers Overall transfer level: Needs assistance   Transfers: Sit to/from Stand Sit to Stand: Min assist           General transfer comment: min assist to stand from EOB with bil UE on P.T. elbows x 2 trials. Pt unable to significantly bear weight on RLE and limited ability to side step toward [facility] with mod cues. RN denied transfer to chair and pt denied attempting gait with return to bed    Ambulation/Gait                  Stairs            Wheelchair Mobility    Modified Rankin (Stroke Patients Only)       Balance Overall balance assessment: Needs assistance   Sitting balance-Leahy Scale: Poor Sitting balance - Comments: min assist for sitting balance, pt impulsive with retropulsion at times   Standing balance support: Bilateral upper extremity supported Standing balance-Leahy Scale: Poor                               Pertinent Vitals/Pain Pain Assessment Pain Assessment: Faces Faces Pain Scale: Hurts little more Pain Location:  RLe in standing Pain Descriptors / Indicators: Grimacing Pain Intervention(s): Limited activity within patient's tolerance, Repositioned, Monitored during session    [place] expects to be discharged to:: Private residence Living Arrangements: Non-relatives/Friends Available  Help at Discharge: Friend(s);Available PRN/intermittently Type of Home: House Home Access: Level entry       Home Layout: One level Home Equipment: Conservation officer, nature (2 wheels);Rollator (4 wheels) Additional Comments: pt reports living with friend in a house    Prior Function               Mobility Comments: pt states she was walking with RW       Hand Dominance        Extremity/Trunk Assessment   Upper Extremity Assessment Upper Extremity Assessment: Generalized weakness    Lower Extremity Assessment Lower Extremity Assessment: RLE deficits/detail RLE Deficits / Details: pt maintaining knee flexion in standing and reports pain but unable to describe or define    Cervical / Trunk Assessment Cervical / Trunk Assessment: Kyphotic  Communication   Communication: Expressive difficulties  Cognition Arousal/Alertness: Awake/alert Behavior During Therapy: Impulsive, Restless Overall Cognitive Status: Impaired/Different from baseline Area of Impairment: Orientation, Attention, Memory, Following commands, Safety/judgement, Awareness                 Orientation Level: Disoriented to, Time, Place, Situation Current Attention Level: Focused Memory: Decreased short-term memory Following Commands: Follows one step commands inconsistently, Follows one step commands with increased time Safety/Judgement: Decreased awareness of safety, Decreased awareness of deficits     General Comments: pt stating time as Oct, not oriented other than to self. PT able to follow single step commands but impulsive and will move without awareness of safety or lines        General Comments      Exercises     Assessment/Plan    PT Assessment Patient needs continued PT services  PT Problem List Decreased strength;Decreased mobility;Decreased safety awareness;Decreased activity tolerance;Decreased cognition;Decreased balance;Decreased knowledge of use of DME       PT Treatment  Interventions DME instruction;Therapeutic activities;Cognitive remediation;Gait training;Therapeutic exercise;Patient/family education;Balance training;Functional mobility training;Neuromuscular re-education    PT Goals (Current goals can be found in the Care Plan section)  Acute Rehab PT Goals Patient Stated Goal: return home PT Goal Formulation: With patient Time For Goal Achievement: 11/18/22 Potential to Achieve Goals: Fair    Frequency Min 2X/week     Co-evaluation               AM-PAC PT "6 Clicks" Mobility  Outcome Measure Help needed turning from your back to your side while in a flat bed without using bedrails?: A Little Help needed moving from lying on your back to sitting on the side of a flat bed without using bedrails?: A Little Help needed moving to and from a bed to a chair (including a wheelchair)?: A Lot Help needed standing up from a chair using your arms (e.g., wheelchair or bedside chair)?: A Lot Help needed to walk in hospital room?: Total Help needed climbing 3-5 steps with a railing? : Total 6 Click Score: 12    End of Session Equipment Utilized During Treatment: Gait belt Activity Tolerance: Patient tolerated treatment well Patient left: in bed;with call bell/phone within reach;with restraints reapplied;with bed alarm set Nurse Communication: Mobility status PT Visit Diagnosis: Other abnormalities of gait and mobility (R26.89);History of falling (Z91.81);Muscle weakness (generalized) (M62.81)    Time: 4132-4401 PT Time Calculation (min) (ACUTE ONLY): 13 min  Charges:   PT Evaluation $PT Eval Moderate Complexity: 1 Mod          Carrel Leather P, PT Acute Rehabilitation Services Office: [place] 11/04/2022, 9:16 AM

## 2022-11-04 NOTE — Progress Notes (Signed)
NAME:  Sherry Baker, MRN:  [id number], DOB:  04/14/1966, LOS: 9 ADMISSION DATE:  10/25/2022, CONSULTATION DATE:  12/24 REFERRING MD:  Mesner, CHIEF COMPLAINT:  Confusion   History of Present Illness:  57 y/o female with a long history of cirrhosis and respiratory failure who presented on 12/24 with aspiration pneumonia and severe confusion/agitation with vomiting.  She was intubated in the ER then PCCM consulted.  Since admission she has developed worsening wheezing and air trapping on mechanical ventilation.  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    Pertinent  Medical History   Past Medical History:  Diagnosis Date   Acquired hypothyroidism    Alcohol abuse    Allergy    PCNS swelling   Arthritis    Bipolar 1 disorder ([place])    Cancer ([place]) 01/21/2017   rectal cancer   Cancer ([place])    Chronic kidney disease    Chronic pain syndrome    Cirrhosis ([place])    Cirrhosis of liver (HCC)    Cocaine abuse ([place])    COVID-19 virus infection 06/23/2021   Delirium tremens ([place]) 06/22/2021   Depression    Genetic testing 03/24/2017   Ms. Rigsbee underwent genetic counseling and testing for hereditary cancer syndromes on 02/17/2017. Her results were negative for mutations in all 46 genes analyzed by [facility]'s 46-gene Common Hereditary Cancers Panel. Genes analyzed include: APC, ATM, AXIN2, BARD1, BMPR1A, BRCA1, BRCA2, BRIP1, CDH1, CDKN2A, CHEK2, CTNNA1, DICER1, EPCAM, GREM1, HOXB13, KIT, MEN1, MLH1, MSH2, MSH3, MSH6, MUTYH, NBN,   HTN (hypertension)    Hypertension    Polysubstance abuse ([place])    Rectal cancer ([place])    Suicidal ideation 02/11/2014    Significant Hospital Events: Including procedures, antibiotic start and stop dates in addition to other pertinent events   12/24 intubated, admitted to ICU 12/25 strep pneumo positive, trach culture moraxella catarrhalis, UC citrobacter freundii; on rocephin/azithro 12/26: remains intubated on  prop 12/27 weaned 2hrs on PSV  12/28 extremely agitated and dyssynchronous with vent 12/29 better vent compliance, decreasing sedation 12/31 febrile, increased secretions, sputum cx/ abx restarted 1/1 extubated.  Interim History / Subjective:   Extubated yesterday Continues on Precedex.  Remains intermittently agitated, confused  Objective   Blood pressure (!) 151/82, pulse 76, temperature 99.9 F (37.7 C), resp. rate 17, height _0  (1.575 m), weight 47.8 kg, SpO2 100 %.    Vent Mode: PSV;CPAP FiO2 (%):  [40 %] 40 % PEEP:  [5 cmH20] 5 cmH20 Pressure Support:  [10 cmH20] 10 cmH20   Intake/Output Summary (Last 24 hours) at 11/04/2022 0912 Last data filed at 11/04/2022 0800 Gross per 24 hour  Intake 2628.32 ml  Output 3940 ml  Net -1311.68 ml   Filed Weights   11/02/22 0330 11/03/22 0338 11/04/22 0313  Weight: 48.4 kg 47.6 kg 47.8 kg    Examination: Gen:      No acute distress, chronically ill-appearing HEENT:  EOMI, sclera anicteric, laceration over left eyebrow and black eye Neck:     No masses; no thyromegaly Lungs:    Clear to auscultation bilaterally; normal respiratory effort CV:         Regular rate and rhythm; no murmurs Abd:      + bowel sounds; soft, non-tender; no palpable masses, no distension, abdominal ostomy Ext:    No edema; adequate peripheral perfusion Skin:      Warm and dry; no rash Neuro: alert and oriented x 3 Psych:  normal mood and affect   Labs/imaging reviewed Significant for glucose 122, creatinine 1.23 Hemoglobin 10.3 Trach asp 12/31> GPCs>   Resolved Hospital Problem list     Assessment & Plan:  Acute hypoxemic respiratory failure  Aspiration pneumonia Presumable COPD or asthma with acute exacerbation> no baseline PFT Severe ventilator dyssynchrony 12/28 with wheezing, air trapping R/o HCAP . Extubated on 1/1 Continue cefepime, linezolid.  Follow trach aspirate cultures Intermittent chest x-ray  Visiculocutaneous fistula,  chronic Outpatient urology f/u Ostomy   Acute toxic metabolic encephalopathy Alcohol abuse Narcotic abuse Cocaine abuse Amphetamine abuse Anxiety Bipolar 1 Need for sedation for mechanical ventilation -Wean down Precedex as tolerated - cont clonazepam, buspar, sertraline (monitor for serotonin syndrome while on linezolid)  AKI > improved Chronic vesiculocutaneous fistula Urinary retention Monitor urine output and creatinine.  Demand ischemia - Telemetry monitoring   Best Practice (right click and "Reselect all SmartList Selections" daily)   Diet/type: tubefeeds- cortrak DVT prophylaxis: prophylactic heparin  GI prophylaxis: H2B Lines: N/A Foley:  Yes, and it is still needed Code Status:  full code Last date of multidisciplinary goals of care discussion [12/30: cousin discussion with McQuaid. She doesn't want long term mechanical ventilation but OK with short term ICU level care. ] Pending update 1/2.  No family at bedside  Critical care time:    The patient is critically ill with multiple organ system failure and requires high complexity decision making for assessment and support, frequent evaluation and titration of therapies, advanced monitoring, review of radiographic studies and interpretation of complex data.   Critical Care Time devoted to patient care services, exclusive of separately billable procedures, described in this note is 35 minutes.   Marshell Garfinkel MD [facility] See Amion for pager  If no response to pager , please call [phone] until 7pm After 7:00 pm call Elink  [phone] 11/04/2022, 9:20 AM

## 2022-11-05 DIAGNOSIS — R4182 Altered mental status, unspecified: Secondary | ICD-10-CM | POA: Diagnosis not present

## 2022-11-05 LAB — BASIC METABOLIC PANEL
Anion gap: 7 (ref 5–15)
BUN: 50 mg/dL — ABNORMAL HIGH (ref 6–20)
CO2: 25 mmol/L (ref 22–32)
Calcium: 8.9 mg/dL (ref 8.9–10.3)
Chloride: 101 mmol/L (ref 98–111)
Creatinine, Ser: 1.1 mg/dL — ABNORMAL HIGH (ref 0.44–1.00)
GFR, Estimated: 59 mL/min — ABNORMAL LOW (ref >60)
Glucose, Bld: 122 mg/dL — ABNORMAL HIGH (ref 70–99)
Potassium: 4.2 mmol/L (ref 3.5–5.1)
Sodium: 133 mmol/L — ABNORMAL LOW (ref 135–145)

## 2022-11-05 LAB — CBC
HCT: 34.4 % — ABNORMAL LOW (ref 36.0–46.0)
Hemoglobin: 11.5 g/dL — ABNORMAL LOW (ref 12.0–15.0)
MCH: 28.8 pg (ref 26.0–34.0)
MCHC: 33.4 g/dL (ref 30.0–36.0)
MCV: 86.2 fL (ref 80.0–100.0)
Platelets: 241 10*3/uL (ref 150–400)
RBC: 3.99 MIL/uL (ref 3.87–5.11)
RDW: 15.3 % (ref 11.5–15.5)
WBC: 7.1 10*3/uL (ref 4.0–10.5)
nRBC: 0 % (ref 0.0–0.2)

## 2022-11-05 LAB — CULTURE, RESPIRATORY W GRAM STAIN

## 2022-11-05 LAB — GLUCOSE, CAPILLARY
Glucose-Capillary: 121 mg/dL — ABNORMAL HIGH (ref 70–99)
Glucose-Capillary: 117 mg/dL — ABNORMAL HIGH (ref 70–99)
Glucose-Capillary: 103 mg/dL — ABNORMAL HIGH (ref 70–99)
Glucose-Capillary: 91 mg/dL (ref 70–99)
Glucose-Capillary: 69 mg/dL — ABNORMAL LOW (ref 70–99)
Glucose-Capillary: 169 mg/dL — ABNORMAL HIGH (ref 70–99)

## 2022-11-05 MED ORDER — DEXTROSE 50 % IV SOLN: 25.0000 mL | INTRAVENOUS | Status: DC | PRN

## 2022-11-05 MED ORDER — ONDANSETRON HCL 4 MG PO TABS
4.0000 mg | ORAL_TABLET | Freq: Three times a day (TID) | ORAL | Status: DC | PRN
  Administered 2022-11-05 – 2022-11-06 (×2): 4 mg
  Filled 2022-11-05 (×4): qty 1

## 2022-11-05 MED ORDER — DEXTROSE 50 % IV SOLN
INTRAVENOUS | Status: AC
  Administered 2022-11-05: 50 mL
  Filled 2022-11-05: qty 50

## 2022-11-05 MED ORDER — UMECLIDINIUM-VILANTEROL 62.5-25 MCG/ACT IN AEPB
1.0000 | INHALATION_SPRAY | Freq: Every day | RESPIRATORY_TRACT | Status: DC
  Administered 2022-11-07 – 2022-11-08 (×2): 1 via RESPIRATORY_TRACT
  Filled 2022-11-05: qty 14

## 2022-11-05 MED ORDER — OXYCODONE HCL 5 MG PO TABS
5.0000 mg | ORAL_TABLET | Freq: Four times a day (QID) | ORAL | Status: DC | PRN
  Administered 2022-11-05 – 2022-11-08 (×5): 5 mg
  Filled 2022-11-05 (×6): qty 1

## 2022-11-05 NOTE — Progress Notes (Signed)
NAME:  Sherry Baker, MRN:  [id number], DOB:  February 18, 1966, LOS: 15 ADMISSION DATE:  10/25/2022, CONSULTATION DATE:  12/24 REFERRING MD:  Mesner, CHIEF COMPLAINT:  Confusion   History of Present Illness:  57 y/o female with a long history of cirrhosis and respiratory failure who presented on 12/24 with aspiration pneumonia and severe confusion/agitation with vomiting.  She was intubated in the ER then PCCM consulted.  Since admission she has developed worsening wheezing and air trapping on mechanical ventilation.  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    Pertinent  Medical History   Past Medical History:  Diagnosis Date   Acquired hypothyroidism    Alcohol abuse    Allergy    PCNS swelling   Arthritis    Bipolar 1 disorder ([place])    Cancer ([place]) 01/21/2017   rectal cancer   Cancer ([place])    Chronic kidney disease    Chronic pain syndrome    Cirrhosis ([place])    Cirrhosis of liver (HCC)    Cocaine abuse ([place])    COVID-19 virus infection 06/23/2021   Delirium tremens ([place]) 06/22/2021   Depression    Genetic testing 03/24/2017   Ms. Cumpian underwent genetic counseling and testing for hereditary cancer syndromes on 02/17/2017. Her results were negative for mutations in all 46 genes analyzed by [facility]'s 46-gene Common Hereditary Cancers Panel. Genes analyzed include: APC, ATM, AXIN2, BARD1, BMPR1A, BRCA1, BRCA2, BRIP1, CDH1, CDKN2A, CHEK2, CTNNA1, DICER1, EPCAM, GREM1, HOXB13, KIT, MEN1, MLH1, MSH2, MSH3, MSH6, MUTYH, NBN,   HTN (hypertension)    Hypertension    Polysubstance abuse ([place])    Rectal cancer ([place])    Suicidal ideation 02/11/2014    Significant Hospital Events: Including procedures, antibiotic start and stop dates in addition to other pertinent events   12/24 intubated, admitted to ICU 12/25 strep pneumo positive, trach culture moraxella catarrhalis, UC citrobacter freundii; on rocephin/azithro 12/26: remains intubated on  prop 12/27 weaned 2hrs on PSV  12/28 extremely agitated and dyssynchronous with vent 12/29 better vent compliance, decreasing sedation 12/31 febrile, increased secretions, sputum cx/ abx restarted 1/1 extubated.  Interim History / Subjective:   Bradycardic on precedex overnight, stopped Still having occasional outbursts, but is redirectable.  C/o pain around the site of the sacral wound, but is not cooperative with turning efforts. Nausea  Objective   Blood pressure 116/69, pulse 65, temperature 97.7 F (36.5 C), temperature source Bladder, resp. rate 19, height _0  (1.575 m), weight 47.8 kg, SpO2 98 %.        Intake/Output Summary (Last 24 hours) at 11/05/2022 0939 Last data filed at 11/05/2022 0920 Gross per 24 hour  Intake 2534.25 ml  Output 1255 ml  Net 1279.25 ml    Filed Weights   11/02/22 0330 11/03/22 0338 11/04/22 0313  Weight: 48.4 kg 47.6 kg 47.8 kg    Examination: Gen:      Thin middle aged female in NAD HEENT:  Normocephalic, healing laceration over the left eye.  Lungs:    Clear bilateral breath sounds CV:         RRR, no MRG Abd:      Soft, NT, ND bowel sounds normoactive. Fistula collection bag in place.  Ext:    No edema or deformity Skin:       Warm and dry Neuro:    Alert and oriented. Non-focal   Resolved Hospital Problem list   Severe ventilator dyssynchrony 12/28 with wheezing, air  trapping Acute hypoxemic respiratory failure  Assessment & Plan:   Aspiration pneumonia, HCAP Presumable COPD or asthma with acute exacerbation> no baseline PFT Extubated on 1/1 - DC cefepime - Continue linezolid day 4/5. Corynebacterium in the sputum with symptoms improving.  - Resume home anoro. DC nebs  Acute toxic metabolic encephalopathy Alcohol abuse Narcotic abuse Cocaine abuse Amphetamine abuse Anxiety Bipolar 1 Need for sedation for mechanical ventilation - Precedex discontinued due to bradycardia - cont clonazepam, buspar, sertraline (monitor  for serotonin syndrome while on linezolid)  Visiculocutaneous fistula, chronic - Outpatient urology f/u  AKI > improved Chronic vesiculocutaneous fistula Urinary retention Monitor urine output and creatinine.  Demand ischemia - Telemetry monitoring   Sacral wound (POA) - WOC recs - Pain management- add 31m oxy IR PRN  Best Practice (right click and "Reselect all SmartList Selections" daily)   Diet/type: tubefeeds- cortrak DVT prophylaxis: prophylactic heparin  GI prophylaxis: N/A Lines: N/A Foley:  removal ordered  Code Status:  full code Last date of multidisciplinary goals of care discussion [12/30: cousin discussion with McQuaid. She doesn't want long term mechanical ventilation but OK with short term ICU level care. ] Pending update 1/2.  No family at bedside  Critical care time: 38 minutes     PGeorgann Housekeeper AGACNP-BC LFarnhamvillefor personal pager PCCM on call pager ([phone] 7pm. Please call Elink 7p-7a. [phone] 11/05/2022 9:49 AM

## 2022-11-05 NOTE — Progress Notes (Signed)
PCCM note  Patient is stable off precedex drip. Ok to transfer out of ICU  Marshell Garfinkel MD [facility] See Amion for pager  If no response to pager , please call [phone] until 7pm After 7:00 pm call Elink  [phone] 11/05/2022, 5:55 PM

## 2022-11-05 NOTE — Progress Notes (Signed)
@  2000:  CBG check 69.   1 amp d50 given, recheck 169.  Tube feedings currently on hold due to nausea.  Elink and receiving RN notified.  Pt tx to 3E08

## 2022-11-05 NOTE — Progress Notes (Signed)
Nutrition Follow-up  DOCUMENTATION CODES:   Severe malnutrition in context of social or environmental circumstances  INTERVENTION:   Continue tube feeding via Cortrak: - Osmolite 1.2 @ 60 ml/hr (1440 ml/day)  Tube feeding regimen provides 1728 kcal, 80 grams of protein, and 1181 ml of H2O.   - Continue 1 packet Juven BID per tube, each packet provides 95 calories, 2.5 grams of protein, and 9.8 grams of carbohydrate; also contains L-arginine and L-glutamine, vitamin C, vitamin E, vitamin B-12, mg zinc, calcium, and calcium Beta-hydroxy-Beta-methylbutyrate to support wound healing  - Continue current MVI with minerals, thiamine, folic acid  NUTRITION DIAGNOSIS:   Severe Malnutrition related to social / environmental circumstances (etoh and drug abuse) as evidenced by severe fat depletion, severe muscle depletion.  Ongoing, being addressed via TF  GOAL:   Patient will meet greater than or equal to 90% of their needs  Met via TF  MONITOR:   Labs, Weight trends, TF tolerance, Skin, I & O's  REASON FOR ASSESSMENT:   Consult Enteral/tube feeding initiation and management  ASSESSMENT:   Pt with hx of cirrhosis, hx rectal cancer (s/p colostomy), EtOH/polysubstance abuse, HTN, and CKD brought to ED by police with concern for AMS. Well known to nutrition team from numerous prior admissions.  12/28 - pt vomited, TF held 12/29 - TF resumed, Cortrak placed (tip gastric), pt vomited, TF held 12/31 - TF restarted 01/01 - extubated  Discussed pt with RN and during ICU rounds. Tube feeds were infusing at goal rate at time of RD visit. Later noted pt having nausea and tube feeds have been held since 1217. PRN zofran administered per MAR. Precedex off this AM. Pt remains NPO and is intermittently agitated.  Admit weight: 50 kg Current weight: 47.8 kg  Current TF: Osmolite 1.2 @ 60 ml/hr  Medications reviewed and include: folic acid, MVI with minerals, Juven BID, thiamine, IV  abx  Labs reviewed: sodium 133, BUN 50, creatinine 1.10 CBG's: 103-195 x 24 hours  UOP: 1405 ml x 24 hours Colostomy: 100 ml x 24 hours I/O's: +4.9 L since admit  Diet Order:   Diet Order             Diet NPO time specified  Diet effective now                   EDUCATION NEEDS:   Not appropriate for education at this time  Skin:  Skin Assessment: Reviewed RN Assessment Stage I: right elbow, left elbow Stage IV: coccyx DTI: left knee  Last BM:  11/05/22 colostomy  Height:   Ht Readings from Last 1 Encounters:  10/29/22 _0  (1.575 m)    Weight:   Wt Readings from Last 1 Encounters:  11/04/22 47.8 kg    Ideal Body Weight:  50 kg  BMI:  Body mass index is 19.27 kg/m.  Estimated Nutritional Needs:   Kcal:  1500-1700 kcal/d  Protein:  75-90g/d  Fluid:  >/=1.5L/d    Gustavus Bryant, MS, RD, LDN Inpatient Clinical Dietitian Please see AMiON for contact information.

## 2022-11-06 DIAGNOSIS — R0902 Hypoxemia: Secondary | ICD-10-CM

## 2022-11-06 DIAGNOSIS — R4182 Altered mental status, unspecified: Secondary | ICD-10-CM

## 2022-11-06 DIAGNOSIS — F1920 Other psychoactive substance dependence, uncomplicated: Secondary | ICD-10-CM

## 2022-11-06 LAB — GLUCOSE, CAPILLARY
Glucose-Capillary: 108 mg/dL — ABNORMAL HIGH (ref 70–99)
Glucose-Capillary: 75 mg/dL (ref 70–99)
Glucose-Capillary: 84 mg/dL (ref 70–99)
Glucose-Capillary: 85 mg/dL (ref 70–99)
Glucose-Capillary: 85 mg/dL (ref 70–99)
Glucose-Capillary: 93 mg/dL (ref 70–99)
Glucose-Capillary: 94 mg/dL (ref 70–99)

## 2022-11-06 MED ORDER — METOCLOPRAMIDE HCL 5 MG/ML IJ SOLN
5.0000 mg | Freq: Four times a day (QID) | INTRAMUSCULAR | Status: DC
  Administered 2022-11-06 – 2022-11-07 (×3): 5 mg via INTRAVENOUS
  Filled 2022-11-06 (×3): qty 2

## 2022-11-06 MED ORDER — PROCHLORPERAZINE EDISYLATE 10 MG/2ML IJ SOLN
10.0000 mg | Freq: Four times a day (QID) | INTRAMUSCULAR | Status: DC | PRN
  Administered 2022-11-06: 10 mg via INTRAVENOUS
  Filled 2022-11-06 (×2): qty 2

## 2022-11-06 NOTE — Progress Notes (Signed)
PROGRESS NOTE    Sherry Baker  GYK:[id number] DOB: Sep 13, 1966 DOA: 10/25/2022 PCP: Pcp, No    Brief Narrative:   57 year old with history of cirrhosis, alcohol abuse admitted with acute respiratory failure secondary to pneumococcus pneumonia, Citrobacter UTI   Assessment and Plan: Aspiration pneumonia, HCAP Presumable COPD or asthma with acute exacerbation> no baseline PFT Extubated on 1/1 - DC cefepime - Continue linezolid day 5/5. Corynebacterium in the sputum with symptoms improving.  - Resume home anoro   Acute toxic metabolic encephalopathy Alcohol abuse Narcotic abuse Cocaine abuse Amphetamine abuse Anxiety Bipolar 1 - cont clonazepam, buspar, sertraline (monitor for serotonin syndrome while on linezolid) -encourage cessation of drugs   AKI > improved  Chronic vesiculocutaneous fistula Urinary retention Monitor urine output and creatinine. -outpatient follow up   Demand ischemia - Telemetry monitoring    Sacral wound (POA) - WOC recs  Passes SLP- diet ordered, plan to d/c cortrak if eating well -suspect will refuse SNF  DVT prophylaxis: heparin injection 5,000 Units Start: 10/26/22 1400    Code Status: Full Code   Disposition Plan:  Level of care: Progressive Status is: Inpatient Remains inpatient appropriate because: needs SNF (suspect will refuse)    Consultants:  pccm   Subjective: Asking for food  Objective: Vitals:   11/05/22 2100 11/05/22 2129 11/05/22 2341 11/06/22 0500  BP: (!) 154/88 (!) 164/88 (!) 147/84   Pulse: (!) 114 (!) 118 (!) 110   Resp: (!) '23 19 14   '$ Temp:  98.9 F (37.2 C) 98.4 F (36.9 C)   TempSrc:  Oral Oral   SpO2: 98% 100% 100%   Weight:  45.4 kg  45.2 kg  Height:        Intake/Output Summary (Last 24 hours) at 11/06/2022 1128 Last data filed at 11/06/2022 0000 Gross per 24 hour  Intake 77 ml  Output 1300 ml  Net -1223 ml   Filed Weights   11/04/22 0313 11/05/22 2129 11/06/22 0500  Weight: 47.8 kg 45.4  kg 45.2 kg    Examination:   General: Appearance:    Thin female in no acute distress     Lungs:     Clear to auscultation bilaterally, respirations unlabored  Heart:    Tachycardic. Normal rhythm. No murmurs, rubs, or gallops.    MS:   All extremities are intact.    Neurologic:   Awake, alert       Data Reviewed: I have personally reviewed following labs and imaging studies  CBC: Recent Labs  Lab 10/31/22 0701 11/03/22 0808 11/05/22 0610  WBC 6.0 4.9 7.1  HGB 10.2* 10.3* 11.5*  HCT 33.4* 33.0* 34.4*  MCV 92.0 89.4 86.2  PLT 186 226 793   Basic Metabolic Panel: Recent Labs  Lab 10/31/22 0701 11/01/22 0634 11/03/22 0808 11/04/22 0611 11/05/22 0610  NA 145 146* 135 136 133*  K 4.9 4.4 4.7 4.7 4.2  CL 115* 112* 98 104 101  CO2 '23 23 28 '$ 21* 25  GLUCOSE 92 92 95 122* 122*  BUN 36* 39* 47* 43* 50*  CREATININE 1.54* 1.30* 1.26* 1.23* 1.10*  CALCIUM 8.6* 9.1 8.9 9.0 8.9  MG 2.2  --   --   --   --    GFR: Estimated Creatinine Clearance: 41.2 mL/min (A) (by C-G formula based on SCr of 1.1 mg/dL (H)). Liver Function Tests: No results for input(s): "AST", "ALT", "ALKPHOS", "BILITOT", "PROT", "ALBUMIN" in the last 168 hours. No results for input(s): "LIPASE", "AMYLASE" in the last  168 hours. No results for input(s): "AMMONIA" in the last 168 hours. Coagulation Profile: No results for input(s): "INR", "PROTIME" in the last 168 hours. Cardiac Enzymes: No results for input(s): "CKTOTAL", "CKMB", "CKMBINDEX", "TROPONINI" in the last 168 hours. BNP (last 3 results) No results for input(s): "PROBNP" in the last 8760 hours. HbA1C: No results for input(s): "HGBA1C" in the last 72 hours. CBG: Recent Labs  Lab 11/05/22 1958 11/05/22 2051 11/06/22 0030 11/06/22 0405 11/06/22 0753  GLUCAP 69* 169* 75 85 85   Lipid Profile: No results for input(s): "CHOL", "HDL", "LDLCALC", "TRIG", "CHOLHDL", "LDLDIRECT" in the last 72 hours. Thyroid Function Tests: No results for  input(s): "TSH", "T4TOTAL", "FREET4", "T3FREE", "THYROIDAB" in the last 72 hours. Anemia Panel: No results for input(s): "VITAMINB12", "FOLATE", "FERRITIN", "TIBC", "IRON", "RETICCTPCT" in the last 72 hours. Sepsis Labs: No results for input(s): "PROCALCITON", "LATICACIDVEN" in the last 168 hours.  Recent Results (from the past 240 hour(s))  Culture, Respiratory w Gram Stain     Status: None   Collection Time: 11/02/22 12:22 PM   Specimen: Tracheal Aspirate; Respiratory  Result Value Ref Range Status   Specimen Description TRACHEAL ASPIRATE  Final   Special Requests NONE  Final   Gram Stain   Final    RARE GRAM POSITIVE COCCI IN SINGLES IN PAIRS NO WBC SEEN    Culture   Final    RARE CORYNEBACTERIUM STRIATUM Standardized susceptibility testing for this organism is not available. Performed at [facility], [place] [address], [place], [place] [address]    Report Status 11/05/2022 FINAL  Final         Radiology Studies: No results found.      Scheduled Meds:  busPIRone  15 mg Per Tube BID   Chlorhexidine Gluconate Cloth  6 each Topical Daily   clonazepam  1 mg Per Tube BID   folic acid  1 mg Per Tube Daily   heparin  5,000 Units Subcutaneous Q8H   leptospermum manuka honey  1 Application Topical Daily   multivitamin with minerals  1 tablet Per Tube Daily   nutrition supplement (JUVEN)  1 packet Per Tube BID BM   QUEtiapine  50 mg Per Tube BID   sertraline  50 mg Per Tube Daily   thiamine  100 mg Per Tube Daily   umeclidinium-vilanterol  1 puff Inhalation Daily   Continuous Infusions:  sodium chloride Stopped (11/03/22 0908)   sodium chloride Stopped (10/31/22 1212)   feeding supplement (OSMOLITE 1.2 CAL) Stopped (11/05/22 1217)   linezolid (ZYVOX) IV 600 mg (11/06/22 0935)     LOS: 11 days    Time spent: 45 minutes spent on chart review, discussion with nursing staff, consultants, updating family and interview/physical exam; more than 50% of that time was  spent in counseling and/or coordination of care.    Geradine Girt, DO [facility] Available via Epic secure chat 7am-7pm After these hours, please refer to coverage provider listed on [email or web address] 11/06/2022, 11:28 AM

## 2022-11-06 NOTE — TOC Progression Note (Signed)
Transition of Care [facility]) - Progression Note    Patient Details  Name: Sherry Baker MRN: [id number] Date of Birth: August 10, 1966  Transition of Care [facility]) CM/SW [place], LCSW Phone Number: 11/06/2022, 3:24 PM  Clinical Narrative:   CSW spoke with pt regarding recommendation for SNF. Pt agreed to have SNF referrals sent to facilities in the [place] area. Fl2 done and documents uploaded to [facility] for review.     Expected Discharge Plan: [place] Barriers to Discharge: Continued Medical Work up  Expected Discharge Plan and Services In-house Referral: Clinical Social Work Discharge Planning Services: CM Consult Post Acute Care Choice: [place] Living arrangements for the past 2 months: Apartment                                       Social Determinants of Health (SDOH) Interventions SDOH Screenings   Depression (PHQ2-9): High Risk (01/30/2022)  Tobacco Use: High Risk (07/18/2022)    Readmission Risk Interventions    06/16/2022    4:54 PM 04/17/2022   11:21 AM 12/10/2021    1:58 PM  Readmission Risk Prevention Plan  Transportation Screening Complete Complete Complete  Medication Review Press photographer)  Complete Complete  PCP or Specialist appointment within 3-5 days of discharge Not Complete Complete Complete  PCP/Specialist Appt Not Complete comments patient needs to make apt    [place] or [place] Complete Complete Not Complete  HRI or Home Care Consult Pt Refusal Comments   history of unsafe home environment  SW Recovery Care/Counseling Consult  Complete Complete  Palliative Care Screening  Not Applicable Not [place] Patient Refused Patient Refused Not Applicable   Beckey Rutter, MSW, LCSWA, LCASA Transitions of Care  Clinical Social Worker I

## 2022-11-06 NOTE — Evaluation (Signed)
Clinical/Bedside Swallow Evaluation Patient Details  Name: Sherry Baker MRN: [id number] Date of Birth: 1966-06-17  Today's Date: 11/06/2022 Time: SLP Start Time (ACUTE ONLY): 9833 SLP Stop Time (ACUTE ONLY): 0845 SLP Time Calculation (min) (ACUTE ONLY): 7 min  Past Medical History:  Past Medical History:  Diagnosis Date   Acquired hypothyroidism    Alcohol abuse    Allergy    PCNS swelling   Arthritis    Bipolar 1 disorder ([place])    Cancer ([place]) 01/21/2017   rectal cancer   Cancer ([place])    Chronic kidney disease    Chronic pain syndrome    Cirrhosis ([place])    Cirrhosis of liver ([place])    Cocaine abuse ([place])    COVID-19 virus infection 06/23/2021   Delirium tremens ([place]) 06/22/2021   Depression    Genetic testing 03/24/2017   [name] underwent genetic counseling and testing for hereditary cancer syndromes on 02/17/2017. Her results were negative for mutations in all 46 genes analyzed by [facility]'s 46-gene Common Hereditary Cancers Panel. Genes analyzed include: APC, ATM, AXIN2, BARD1, BMPR1A, BRCA1, BRCA2, BRIP1, CDH1, CDKN2A, CHEK2, CTNNA1, DICER1, EPCAM, GREM1, HOXB13, KIT, MEN1, MLH1, MSH2, MSH3, MSH6, MUTYH, NBN,   HTN (hypertension)    Hypertension    Polysubstance abuse ([place])    Rectal cancer ([place])    Suicidal ideation 02/11/2014   Past Surgical History:  Past Surgical History:  Procedure Laterality Date   ABDOMINAL PERINEAL BOWEL RESECTION N/A 06/18/2017   Procedure: ABDOMINAL PERINEAL RESECTION ERAS PATHWAY;  Surgeon: Leighton Ruff, MD;  Location: [facility];  Service: General;  Laterality: N/A;   BUBBLE STUDY  11/29/2021   Procedure: BUBBLE STUDY;  Surgeon: Berniece Salines, DO;  Location: [facility];  Service: Cardiovascular;;   COLON SURGERY     COLONOSCOPY WITH PROPOFOL Left 01/21/2017   Procedure: COLONOSCOPY WITH PROPOFOL;  Surgeon: Otis Brace, MD;  Location: [facility];  Service: Gastroenterology;  Laterality: Left;   Colostomy     FRACTURE SURGERY     MANDIBLE  FRACTURE SURGERY     TEE WITHOUT CARDIOVERSION N/A 11/29/2021   Procedure: TRANSESOPHAGEAL ECHOCARDIOGRAM (TEE);  Surgeon: Berniece Salines, DO;  Location: [facility];  Service: Cardiovascular;  Laterality: N/A;   HPI:  57 yo female under arrest by [employer] 10/25/22 when developed AMS and EMS called. In ED pt with agitation and aspiration PNA requiring intubation 12/24. Extubated 11/03/22. PMhx: COPD, anxiety, ETOH withdrawal, narcotic dependence, depression, bipolar, rectal CA, iliostomy, CKD, cirrhosis, HTN, vesiculocutaneous fistula. BSE 06/2022 initially recommended NPO due to agitation and decreased ability. Following day she was upgraded to Dys 3/thin.    Assessment / Plan / Recommendation  Clinical Impression  P alert and happy to see therapist for swallow assessment and sat herself on the edge of bed. Oromotor exam was intact with adequate strength and ROM. She has dental caries with discoloration and is dentition is incomplete. She denied odonophagia from week intubation (extubated 1/1) and exhibited normal vocal quality and strong cough with volitional swallow. Multiple straw sips consumed without indications of airway compromise and coordinated well with respirations. Oral mastication and manipulation across textures was adequate. Pt able to initiate a regular texture, thin liquids, pills with thin without need for further ST. SLP Visit Diagnosis: Dysphagia, unspecified (R13.10)    Aspiration Risk  Mild aspiration risk    Diet Recommendation Regular;Thin liquid   Liquid Administration via: Straw;Cup Medication Administration: Whole meds with liquid Supervision: Patient able to self feed Postural Changes: Seated upright at  90 degrees    Other  Recommendations Oral Care Recommendations: Oral care BID    Recommendations for follow up therapy are one component of a multi-disciplinary discharge planning process, led by the attending physician.  Recommendations may be updated based on patient  status, additional functional criteria and insurance authorization.  Follow up Recommendations No SLP follow up      Assistance Recommended at Discharge    Functional Status Assessment    Frequency and Duration            Prognosis        Swallow Study   General Date of Onset: 10/25/22 HPI: 57 yo female under arrest by [employer] 10/25/22 when developed AMS and EMS called. In ED pt with agitation and aspiration PNA requiring intubation 12/24. Extubated 11/03/22. PMhx: COPD, anxiety, ETOH withdrawal, narcotic dependence, depression, bipolar, rectal CA, iliostomy, CKD, cirrhosis, HTN, vesiculocutaneous fistula. BSE 06/2022 initially recommended NPO due to agitation and decreased ability. Following day she was upgraded to Dys 3/thin. Type of Study: Bedside Swallow Evaluation Previous Swallow Assessment:  (see HPI) Diet Prior to this Study: NPO Temperature Spikes Noted: No Respiratory Status: Room air History of Recent Intubation: Yes Length of Intubations (days): 7 days Date extubated: 11/03/22 Behavior/Cognition: Alert;Cooperative;Pleasant mood Oral Cavity Assessment: Within Functional Limits Oral Care Completed by SLP: No Oral Cavity - Dentition: Poor condition;Missing dentition Vision: Functional for self-feeding Self-Feeding Abilities: Able to feed self Patient Positioning: Upright in bed Baseline Vocal Quality: Normal Volitional Cough: Strong Volitional Swallow: Able to elicit    Oral/Motor/Sensory Function Overall Oral Motor/Sensory Function: Within functional limits   Ice Chips Ice chips: Not tested   Thin Liquid Thin Liquid: Within functional limits Presentation: Cup;Straw    Nectar Thick Nectar Thick Liquid: Not tested   Honey Thick Honey Thick Liquid: Not tested   Puree Puree: Within functional limits   Solid     Solid: Within functional limits      Sherry Baker 11/06/2022,9:02 AM

## 2022-11-06 NOTE — NC FL2 (Signed)
[place] LEVEL OF CARE FORM     IDENTIFICATION  Patient Name: Sherry Baker: [birth year] Sex: female Admission Date (Current Location): 10/25/2022  [facility] and [place] Number:  Herbalist and Address:         Provider Number: [id number]  Attending Physician Name and Address:  Geradine Girt, DO  Relative Name and Phone Number:       Current Level of Care: Hospital Recommended Level of Care: [place] Prior Approval Number:    Date Approved/Denied:   PASRR Number: Pending  Discharge Plan: SNF    Current Diagnoses: Patient Active Problem List   Diagnosis Date Noted   Hypoxia 10/30/2022   History of colon cancer    Normocytic anemia    Cocaine abuse with intoxication ([place])    Fistula    Acidosis    Opioid overdose ([place]) 04/08/2022   Pressure injury of skin 03/11/2022   Protein-calorie malnutrition, severe 03/10/2022   UTI (urinary tract infection) 03/06/2022   Hypoglycemia 03/06/2022   Hypernatremia 03/06/2022   Cocaine abuse ([place]) 03/06/2022   High anion gap metabolic acidosis 46/56/8127   Elevated serum creatinine 03/06/2022   Hypercalcemia 03/06/2022   AMS (altered mental status) 03/06/2022   Acquired hypothyroidism    HTN (hypertension)    Endotracheally intubated    Acute encephalopathy 03/05/2022   Protein-calorie malnutrition, severe 02/04/2022   Pressure injury of skin 02/03/2022   Chronic Sacral wound, subsequent encounter 12/13/2021   Right hip pain causing ambulatory dysfunction 11/26/2021   Cellulitis of left hand 11/25/2021   Hyponatremia 11/25/2021   CKD (chronic kidney disease), stage III ([place]) 11/25/2021   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   MRSA bacteremia w/ associated pustular rash 11/25/2021   AKI (acute kidney injury) ([place]) 11/19/2021   Alcohol use disorder, severe, dependence ([place])    Acute respiratory failure with hypoxia (HCC)    Physical deconditioning    Ambulatory  dysfunction    Underweight 06/25/2021   Protein-calorie malnutrition, severe 06/25/2021   Homelessness 51/70/0174   Alcoholic cirrhosis of liver without ascites ([place]) 06/23/2021   Alcohol abuse 06/14/2019   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   Normochromic normocytic anemia 06/06/2019   Pressure injury of skin 06/06/2019   Substance abuse ([place])    Seizure ([place]) 05/21/2019   Polysubstance abuse with associated depression and anxiety 05/18/2019   Genetic testing 03/24/2017   GERD (gastroesophageal reflux disease) 02/18/2017   Rectal cancer ([place]) 01/27/2017   Rectal mass    Anemia 01/16/2017   Constipation    Alcohol abuse    Tobacco abuse    Cocaine abuse ([place])    Bipolar affective disorder ([place])    Severe malnutrition ([place]) 02/13/2014   Substance abuse ([place]) 02/11/2014   Depression 02/11/2014   Thrombocytopenia ([place]) 02/11/2014    Orientation RESPIRATION BLADDER Height & Weight     Self, Place  Normal Incontinent, External catheter Weight: 99 lb 10.4 oz (45.2 kg) Height:  '5\' 2"'$  (157.5 cm)  BEHAVIORAL SYMPTOMS/MOOD NEUROLOGICAL BOWEL NUTRITION STATUS      Continent Diet (See dc summary)  AMBULATORY STATUS COMMUNICATION OF NEEDS Skin   Extensive Assist Verbally Normal                       Personal Care Assistance Level of Assistance  Bathing, Feeding, Dressing Bathing Assistance: Maximum assistance Feeding assistance: Limited assistance Dressing Assistance: Maximum assistance     Functional Limitations Info  Sight,  Hearing, Speech Sight Info: Adequate Hearing Info: Adequate Speech Info: Adequate    SPECIAL CARE FACTORS FREQUENCY  PT (By licensed PT), OT (By licensed OT)     PT Frequency: 5xweek OT Frequency: 5xweek            Contractures Contractures Info: Not present    Additional Factors Info  Code Status Code Status Info: Full             Current Medications (11/06/2022):  This is the current hospital active medication list Current  Facility-Administered Medications  Medication Dose Route Frequency Provider Last Rate Last Admin   0.9 %  sodium chloride infusion   Intravenous PRN Juanito Doom, MD   Stopped at 11/03/22 0908   0.9 %  sodium chloride infusion  250 mL Intravenous Continuous Gleason, Otilio Carpen, PA-C   Stopped at 10/31/22 1212   acetaminophen (TYLENOL) tablet 650 mg  650 mg Per Tube Q6H PRN Juanito Doom, MD   650 mg at 11/05/22 1741   albuterol (PROVENTIL) (2.5 MG/3ML) 0.083% nebulizer solution 2.5 mg  2.5 mg Nebulization Q4H PRN Juanito Doom, MD       busPIRone (BUSPAR) tablet 15 mg  15 mg Per Tube BID Andres Labrum D, PA-C   15 mg at 11/06/22 7939   Chlorhexidine Gluconate Cloth 2 % PADS 6 each  6 each Topical Daily Freddi Starr, MD   6 each at 11/06/22 0929   clonazePAM (KLONOPIN) disintegrating tablet 1 mg  1 mg Per Tube BID Gleason, Otilio Carpen, PA-C   1 mg at 11/06/22 0928   dextrose 50 % solution 25-50 mL  25-50 mL Intravenous PRN Mannam, Praveen, MD       docusate (COLACE) 50 MG/5ML liquid 100 mg  100 mg Per Tube BID PRN Mannam, Praveen, MD       feeding supplement (OSMOLITE 1.2 CAL) liquid 1,000 mL  1,000 mL Per Tube Continuous Juanito Doom, MD   Paused at 03/00/92 3300   folic acid (FOLVITE) tablet 1 mg  1 mg Per Tube Daily Freda Jackson B, MD   1 mg at 11/06/22 0929   heparin injection 5,000 Units  5,000 Units Subcutaneous Q8H Freda Jackson B, MD   5,000 Units at 11/06/22 7622   hydrALAZINE (APRESOLINE) injection 10-40 mg  10-40 mg Intravenous Q4H PRN Andres Labrum D, PA-C   10 mg at 11/05/22 1441   leptospermum manuka honey (MEDIHONEY) paste 1 Application  1 Application Topical Daily Juanito Doom, MD   1 Application at 63/33/54 0929   linezolid (ZYVOX) IVPB 600 mg  600 mg Intravenous Q12H Corey Harold, NP 300 mL/hr at 11/06/22 0935 600 mg at 11/06/22 0935   multivitamin with minerals tablet 1 tablet  1 tablet Per Tube Daily Freddi Starr, MD   1 tablet at 11/06/22  5625   nutrition supplement (JUVEN) (JUVEN) powder packet 1 packet  1 packet Per Tube BID BM Simonne Maffucci B, MD   1 packet at 11/06/22 0928   ondansetron (ZOFRAN) tablet 4 mg  4 mg Per Tube Q8H PRN Corey Harold, NP   4 mg at 11/06/22 1018   Oral care mouth rinse  15 mL Mouth Rinse PRN Mannam, Praveen, MD       oxyCODONE (Oxy IR/ROXICODONE) immediate release tablet 5 mg  5 mg Per Tube Q6H PRN Corey Harold, NP   5 mg at 11/05/22 2009   polyethylene glycol (MIRALAX / GLYCOLAX) packet 17  g  17 g Per Tube Daily PRN Mannam, Praveen, MD       prochlorperazine (COMPAZINE) injection 10 mg  10 mg Intravenous Q6H PRN Vann, Jessica U, DO       QUEtiapine (SEROQUEL) tablet 50 mg  50 mg Per Tube BID Jennelle Human B, NP   50 mg at 11/06/22 0928   sertraline (ZOLOFT) tablet 50 mg  50 mg Per Tube Daily Mick Sell, PA-C   50 mg at 11/06/22 5374   thiamine (VITAMIN B1) tablet 100 mg  100 mg Per Tube Daily Freddi Starr, MD   100 mg at 11/06/22 8270   umeclidinium-vilanterol (ANORO ELLIPTA) 62.5-25 MCG/ACT 1 puff  1 puff Inhalation Daily Corey Harold, NP         Discharge Medications: Please see discharge summary for a list of discharge medications.  Relevant Imaging Results:  Relevant Lab Results:   Additional Information SSN: [id number]  Renard [place]perton, MSW, LCSWA, LCASA Transitions of Care  Clinical Social Worker I

## 2022-11-06 NOTE — Progress Notes (Signed)
RE: Sherry Baker Date of Birth: 1966-08-16 Date: 11/06/2022   To Whom It May Concern:   Please be advised that the above-named patient will require a short-term nursing home stay - anticipated 30 days or less for rehabilitation and strengthening. The plan is for return home.

## 2022-11-06 NOTE — Plan of Care (Signed)
  Problem: Safety: Goal: Non-violent Restraint(s) Outcome: Progressing  No restraints at this time, soft mittens on pt.  Problem: Activity: Goal: Risk for activity intolerance will decrease Outcome: Progressing   Problem: Safety: Goal: Ability to remain free from injury will improve Outcome: Progressing

## 2022-11-07 DIAGNOSIS — R4182 Altered mental status, unspecified: Secondary | ICD-10-CM | POA: Diagnosis not present

## 2022-11-07 DIAGNOSIS — R0902 Hypoxemia: Secondary | ICD-10-CM | POA: Diagnosis not present

## 2022-11-07 LAB — BASIC METABOLIC PANEL
Anion gap: 12 (ref 5–15)
BUN: 37 mg/dL — ABNORMAL HIGH (ref 6–20)
CO2: 19 mmol/L — ABNORMAL LOW (ref 22–32)
Calcium: 9.1 mg/dL (ref 8.9–10.3)
Chloride: 103 mmol/L (ref 98–111)
Creatinine, Ser: 1.52 mg/dL — ABNORMAL HIGH (ref 0.44–1.00)
GFR, Estimated: 40 mL/min — ABNORMAL LOW (ref >60)
Glucose, Bld: 92 mg/dL (ref 70–99)
Potassium: 4.9 mmol/L (ref 3.5–5.1)
Sodium: 134 mmol/L — ABNORMAL LOW (ref 135–145)

## 2022-11-07 LAB — CBC
HCT: 35.4 % — ABNORMAL LOW (ref 36.0–46.0)
Hemoglobin: 11.4 g/dL — ABNORMAL LOW (ref 12.0–15.0)
MCH: 27.9 pg (ref 26.0–34.0)
MCHC: 32.2 g/dL (ref 30.0–36.0)
MCV: 86.8 fL (ref 80.0–100.0)
Platelets: 314 10*3/uL (ref 150–400)
RBC: 4.08 MIL/uL (ref 3.87–5.11)
RDW: 15.9 % — ABNORMAL HIGH (ref 11.5–15.5)
WBC: 7 10*3/uL (ref 4.0–10.5)
nRBC: 0 % (ref 0.0–0.2)

## 2022-11-07 LAB — GLUCOSE, CAPILLARY
Glucose-Capillary: 101 mg/dL — ABNORMAL HIGH (ref 70–99)
Glucose-Capillary: 97 mg/dL (ref 70–99)
Glucose-Capillary: 98 mg/dL (ref 70–99)

## 2022-11-07 MED ORDER — ENSURE ENLIVE PO LIQD
237.0000 mL | Freq: Three times a day (TID) | ORAL | Status: DC
  Administered 2022-11-08 (×2): 237 mL via ORAL

## 2022-11-07 MED ORDER — METOCLOPRAMIDE HCL 5 MG/ML IJ SOLN
5.0000 mg | Freq: Four times a day (QID) | INTRAMUSCULAR | Status: AC
  Administered 2022-11-07 – 2022-11-08 (×4): 5 mg via INTRAVENOUS
  Filled 2022-11-07 (×5): qty 2

## 2022-11-07 MED ORDER — CARVEDILOL 6.25 MG PO TABS
6.2500 mg | ORAL_TABLET | Freq: Two times a day (BID) | ORAL | Status: DC
  Administered 2022-11-07 – 2022-11-08 (×3): 6.25 mg via ORAL
  Filled 2022-11-07 (×3): qty 1

## 2022-11-07 NOTE — Plan of Care (Signed)
  Problem: Elimination: Goal: Will not experience complications related to bowel motility Outcome: Completed/Met Goal: Will not experience complications related to urinary retention Outcome: Completed/Met   Problem: Pain Managment: Goal: General experience of comfort will improve Outcome: Completed/Met   

## 2022-11-07 NOTE — Progress Notes (Signed)
PROGRESS NOTE    Sherry Baker  ION:[id number] DOB: May 01, 1966 DOA: 10/25/2022 PCP: Pcp, No    Brief Narrative:   57 year old with history of cirrhosis, alcohol abuse admitted with acute respiratory failure secondary to pneumococcus pneumonia, Citrobacter UTI.  Stay complicated by poor PO intake  Assessment and Plan: Aspiration pneumonia, HCAP Presumable COPD or asthma with acute exacerbation> no baseline PFT Extubated on 1/1 - DC cefepime - treated with linezolid x 5 days. Corynebacterium in the sputum with symptoms improving.  - Resume home anoro   Acute toxic metabolic encephalopathy Alcohol abuse Narcotic abuse Cocaine abuse Amphetamine abuse Anxiety Bipolar 1 - cont clonazepam, buspar, sertraline (monitor for serotonin syndrome while on linezolid) -encourage cessation of drugs   AKI > improved  Chronic vesiculocutaneous fistula Urinary retention Monitor urine output and creatinine. -outpatient follow up   Demand ischemia - Telemetry monitoring    Sacral wound (POA) - WOC recs  nausea Passed SLP- diet ordered, plan to d/c cortrak if eating well -scheduled reglan x 24 hours -good output into ostomy   -suspect will refuse SNF  DVT prophylaxis: heparin injection 5,000 Units Start: 10/26/22 1400    Code Status: Full Code   Disposition Plan:  Level of care: Med-Surg Status is: Inpatient Remains inpatient appropriate because: needs SNF (suspect will refuse)    Consultants:  pccm   Subjective: Asking to go home Said she ate a banana for breakfast  Objective: Vitals:   11/06/22 0800 11/06/22 1923 11/07/22 0412 11/07/22 0747  BP: (!) 170/96 (!) 144/80 (!) 175/98   Pulse: 99 94 (!) 103   Resp: (!) '27 18 18   '$ Temp:  99 F (37.2 C) 98.6 F (37 C)   TempSrc:  Oral Oral   SpO2: 100% 95% 96% 96%  Weight:   43.8 kg   Height:        Intake/Output Summary (Last 24 hours) at 11/07/2022 1126 Last data filed at 11/07/2022 0414 Gross per 24 hour   Intake 1181.07 ml  Output 620 ml  Net 561.07 ml   Filed Weights   11/05/22 2129 11/06/22 0500 11/07/22 0412  Weight: 45.4 kg 45.2 kg 43.8 kg    Examination:   General: Appearance:    Thin female in no acute distress, briusing around eye     Lungs:     respirations unlabored  Heart:    Tachycardic. Normal rhythm. No murmurs, rubs, or gallops.    MS:   All extremities are intact.    Neurologic:   Awake, alert, poor insight       Data Reviewed: I have personally reviewed following labs and imaging studies  CBC: Recent Labs  Lab 11/03/22 0808 11/05/22 0610 11/07/22 1008  WBC 4.9 7.1 7.0  HGB 10.3* 11.5* 11.4*  HCT 33.0* 34.4* 35.4*  MCV 89.4 86.2 86.8  PLT 226 241 244   Basic Metabolic Panel: Recent Labs  Lab 11/01/22 0634 11/03/22 0808 11/04/22 0611 11/05/22 0610  NA 146* 135 136 133*  K 4.4 4.7 4.7 4.2  CL 112* 98 104 101  CO2 23 28 21* 25  GLUCOSE 92 95 122* 122*  BUN 39* 47* 43* 50*  CREATININE 1.30* 1.26* 1.23* 1.10*  CALCIUM 9.1 8.9 9.0 8.9   GFR: Estimated Creatinine Clearance: 40 mL/min (A) (by C-G formula based on SCr of 1.1 mg/dL (H)). Liver Function Tests: No results for input(s): "AST", "ALT", "ALKPHOS", "BILITOT", "PROT", "ALBUMIN" in the last 168 hours. No results for input(s): "LIPASE", "AMYLASE" in the  last 168 hours. No results for input(s): "AMMONIA" in the last 168 hours. Coagulation Profile: No results for input(s): "INR", "PROTIME" in the last 168 hours. Cardiac Enzymes: No results for input(s): "CKTOTAL", "CKMB", "CKMBINDEX", "TROPONINI" in the last 168 hours. BNP (last 3 results) No results for input(s): "PROBNP" in the last 8760 hours. HbA1C: No results for input(s): "HGBA1C" in the last 72 hours. CBG: Recent Labs  Lab 11/06/22 1130 11/06/22 1617 11/06/22 2022 11/06/22 2343 11/07/22 0411  GLUCAP 94 93 84 108* 101*   Lipid Profile: No results for input(s): "CHOL", "HDL", "LDLCALC", "TRIG", "CHOLHDL", "LDLDIRECT" in  the last 72 hours. Thyroid Function Tests: No results for input(s): "TSH", "T4TOTAL", "FREET4", "T3FREE", "THYROIDAB" in the last 72 hours. Anemia Panel: No results for input(s): "VITAMINB12", "FOLATE", "FERRITIN", "TIBC", "IRON", "RETICCTPCT" in the last 72 hours. Sepsis Labs: No results for input(s): "PROCALCITON", "LATICACIDVEN" in the last 168 hours.  Recent Results (from the past 240 hour(s))  Culture, Respiratory w Gram Stain     Status: None   Collection Time: 11/02/22 12:22 PM   Specimen: Tracheal Aspirate; Respiratory  Result Value Ref Range Status   Specimen Description TRACHEAL ASPIRATE  Final   Special Requests NONE  Final   Gram Stain   Final    RARE GRAM POSITIVE COCCI IN SINGLES IN PAIRS NO WBC SEEN    Culture   Final    RARE CORYNEBACTERIUM STRIATUM Standardized susceptibility testing for this organism is not available. Performed at [facility], [place] [address] [place], [place] [address]    Report Status 11/05/2022 FINAL  Final         Radiology Studies: No results found.      Scheduled Meds:  busPIRone  15 mg Per Tube BID   Chlorhexidine Gluconate Cloth  6 each Topical Daily   clonazepam  1 mg Per Tube BID   folic acid  1 mg Per Tube Daily   heparin  5,000 Units Subcutaneous Q8H   leptospermum manuka honey  1 Application Topical Daily   metoCLOPramide (REGLAN) injection  5 mg Intravenous Q6H   multivitamin with minerals  1 tablet Per Tube Daily   nutrition supplement (JUVEN)  1 packet Per Tube BID BM   QUEtiapine  50 mg Per Tube BID   sertraline  50 mg Per Tube Daily   thiamine  100 mg Per Tube Daily   umeclidinium-vilanterol  1 puff Inhalation Daily   Continuous Infusions:  sodium chloride Stopped (11/03/22 0908)   sodium chloride Stopped (10/31/22 1212)     LOS: 12 days    Time spent: 45 minutes spent on chart review, discussion with nursing staff, consultants, updating family and interview/physical exam; more than 50% of that  time was spent in counseling and/or coordination of care.    Geradine Girt, DO [facility] Available via Epic secure chat 7am-7pm After these hours, please refer to coverage provider listed on [email or web address] 11/07/2022, 11:26 AM

## 2022-11-07 NOTE — Progress Notes (Signed)
Physical Therapy Treatment Patient Details Name: DORSEY CHARETTE MRN: [id number] DOB: 14-Feb-1966 Today's Date: 11/07/2022   History of Present Illness 57 yo female under arrest by [employer] 10/25/22 when developed AMS and EMS called. In ED pt with agitation and aspiration PNA requiring intubation 12/24. Extubated 11/03/22. PMhx: COPD, anxiety, ETOH withdrawal, narcotic dependence, depression, bipolar, rectal CA, iliostomy, CKD, cirrhosis, HTN, vesiculocutaneous fistula    PT Comments    Pt is making good progress with her functional and cognitive status. She is still disoriented to the situation, mildly impulsive, and demonstrates deficits in balance (is a fall risk), memory, and safety awareness, thus she would be unsafe to be home alone. She was able to transfer to stand at a min guard assist level but needed up to minA and continual reminders to remain proximal to her RW when ambulating. If pt has 24/7 care available at home she could d/c home with HHPT follow-up. If she does not have the level of care needed, then continue to recommend SNF.    Recommendations for follow up therapy are one component of a multi-disciplinary discharge planning process, led by the attending physician.  Recommendations may be updated based on patient status, additional functional criteria and insurance authorization.  Follow Up Recommendations  Skilled nursing-short term rehab (<3 hours/day) (vs HHPT if 24/7 care can be arranged) Can patient physically be transported by private vehicle: Yes   Assistance Recommended at Discharge Frequent or constant Supervision/Assistance  Patient can return home with the following A little help with walking and/or transfers;A little help with bathing/dressing/bathroom;Assistance with cooking/housework;Direct supervision/assist for medications management;Direct supervision/assist for financial management;Assist for transportation;Help with stairs or ramp for entrance   Equipment  Recommendations  Rolling walker (2 wheels);BSC/3in1    Recommendations for Other Services       Precautions / Restrictions Precautions Precautions: Fall Restrictions Weight Bearing Restrictions: No     Mobility  Bed Mobility Overal bed mobility: Needs Assistance Bed Mobility: Supine to Sit, Sit to Supine     Supine to sit: Supervision, HOB elevated Sit to supine: Supervision, HOB elevated   General bed mobility comments: Supervision for safety    Transfers Overall transfer level: Needs assistance Equipment used: Rolling walker (2 wheels) Transfers: Sit to/from Stand Sit to Stand: Min guard           General transfer comment: Min guard for safety to come to stand from EOB to RW, instability noted but no LOB    Ambulation/Gait Ambulation/Gait assistance: Min guard, Min assist Gait Distance (Feet): 140 Feet Assistive device: Rolling walker (2 wheels) Gait Pattern/deviations: Step-to pattern, Decreased step length - right, Decreased stride length, Trunk flexed, Narrow base of support Gait velocity: reduced Gait velocity interpretation: <1.31 ft/sec, indicative of household ambulator   General Gait Details: Pt with slow, unsteady step-to gait with chronic L ankle pain per pt. Needs repeated reminders to keep RW proximal. Min guard-minA for stability   Stairs             Wheelchair Mobility    Modified Rankin (Stroke Patients Only)       Balance Overall balance assessment: Needs assistance Sitting-balance support: No upper extremity supported, Feet supported Sitting balance-Leahy Scale: Good     Standing balance support: Single extremity supported, Bilateral upper extremity supported, During functional activity Standing balance-Leahy Scale: Poor Standing balance comment: Reliant on RW  Cognition Arousal/Alertness: Awake/alert Behavior During Therapy: Impulsive (mildly) Overall Cognitive Status:  Impaired/Different from baseline Area of Impairment: Orientation, Attention, Memory, Following commands, Safety/judgement, Awareness                 Orientation Level: Disoriented to, Situation Current Attention Level: Selective Memory: Decreased short-term memory Following Commands: Follows one step commands with increased time, Follows one step commands consistently Safety/Judgement: Decreased awareness of safety, Decreased awareness of deficits Awareness: Emergent   General Comments: Pt mildly impulsive to walk away from bed or turn around within RW to watch therapist make bed when repeatedly cued to face forward to safely hold onto RW. Pt unaware of what caused her hospitalization, but otherwise oriented        Exercises      General Comments        Pertinent Vitals/Pain Pain Assessment Pain Assessment: Faces Faces Pain Scale: Hurts little more Pain Location: chronic L ankle pain Pain Descriptors / Indicators: Grimacing Pain Intervention(s): Monitored during session, Limited activity within patient's tolerance    Home Living                          Prior Function            PT Goals (current goals can now be found in the care plan section) Acute Rehab PT Goals Patient Stated Goal: to go home PT Goal Formulation: With patient Time For Goal Achievement: 11/18/22 Potential to Achieve Goals: Good Progress towards PT goals: Progressing toward goals    Frequency    Min 3X/week      PT Plan Frequency needs to be updated;Equipment recommendations need to be updated    Co-evaluation              AM-PAC PT "6 Clicks" Mobility   Outcome Measure  Help needed turning from your back to your side while in a flat bed without using bedrails?: A Little Help needed moving from lying on your back to sitting on the side of a flat bed without using bedrails?: A Little Help needed moving to and from a bed to a chair (including a wheelchair)?: A  Little Help needed standing up from a chair using your arms (e.g., wheelchair or bedside chair)?: A Little Help needed to walk in hospital room?: A Little Help needed climbing 3-5 steps with a railing? : A Lot 6 Click Score: 17    End of Session   Activity Tolerance: Patient tolerated treatment well Patient left: in bed;with call bell/phone within reach;with bed alarm set Nurse Communication: Mobility status PT Visit Diagnosis: Other abnormalities of gait and mobility (R26.89);History of falling (Z91.81);Muscle weakness (generalized) (M62.81);Unsteadiness on feet (R26.81);Difficulty in walking, not elsewhere classified (R26.2)     Time: 6811-5726 PT Time Calculation (min) (ACUTE ONLY): 13 min  Charges:  $Gait Training: 8-22 mins                     Moishe Spice, PT, DPT Acute Rehabilitation Services  Office: [place] 11/07/2022, 6:17 PM

## 2022-11-07 NOTE — Plan of Care (Signed)
  Problem: Health Behavior/Discharge Planning: Goal: Ability to manage health-related needs will improve Outcome: Progressing   Problem: Clinical Measurements: Goal: Will remain free from infection Outcome: Progressing   Problem: Skin Integrity: Goal: Risk for impaired skin integrity will decrease Outcome: Progressing   

## 2022-11-08 DIAGNOSIS — N179 Acute kidney failure, unspecified: Secondary | ICD-10-CM

## 2022-11-08 DIAGNOSIS — G9341 Metabolic encephalopathy: Secondary | ICD-10-CM

## 2022-11-08 DIAGNOSIS — J9601 Acute respiratory failure with hypoxia: Secondary | ICD-10-CM | POA: Diagnosis not present

## 2022-11-08 DIAGNOSIS — N3000 Acute cystitis without hematuria: Secondary | ICD-10-CM

## 2022-11-08 DIAGNOSIS — J690 Pneumonitis due to inhalation of food and vomit: Secondary | ICD-10-CM

## 2022-11-08 LAB — BASIC METABOLIC PANEL
Anion gap: 9 (ref 5–15)
BUN: 35 mg/dL — ABNORMAL HIGH (ref 6–20)
CO2: 20 mmol/L — ABNORMAL LOW (ref 22–32)
Calcium: 8.8 mg/dL — ABNORMAL LOW (ref 8.9–10.3)
Chloride: 102 mmol/L (ref 98–111)
Creatinine, Ser: 1.52 mg/dL — ABNORMAL HIGH (ref 0.44–1.00)
GFR, Estimated: 40 mL/min — ABNORMAL LOW (ref >60)
Glucose, Bld: 170 mg/dL — ABNORMAL HIGH (ref 70–99)
Potassium: 4.4 mmol/L (ref 3.5–5.1)
Sodium: 131 mmol/L — ABNORMAL LOW (ref 135–145)

## 2022-11-08 MED ORDER — CARVEDILOL 6.25 MG PO TABS: 6.2500 mg | ORAL_TABLET | Freq: Two times a day (BID) | ORAL | 0 refills | Status: DC

## 2022-11-08 MED ORDER — SERTRALINE HCL 50 MG PO TABS: 50.0000 mg | ORAL_TABLET | Freq: Every day | ORAL | 0 refills | Status: DC

## 2022-11-08 MED ORDER — THIAMINE HCL 100 MG PO TABS: 100.0000 mg | ORAL_TABLET | Freq: Every day | ORAL | 0 refills | Status: AC

## 2022-11-08 MED ORDER — ENSURE ENLIVE PO LIQD: 237.0000 mL | Freq: Three times a day (TID) | ORAL | Status: DC

## 2022-11-08 MED ORDER — UMECLIDINIUM-VILANTEROL 62.5-25 MCG/ACT IN AEPB: 1.0000 | INHALATION_SPRAY | Freq: Every day | RESPIRATORY_TRACT | 0 refills | Status: DC

## 2022-11-08 MED ORDER — FOLIC ACID 1 MG PO TABS: 1.0000 mg | ORAL_TABLET | Freq: Every day | ORAL | 0 refills | Status: AC

## 2022-11-08 MED ORDER — ALBUTEROL SULFATE HFA 108 (90 BASE) MCG/ACT IN AERS: 2.0000 | INHALATION_SPRAY | Freq: Four times a day (QID) | RESPIRATORY_TRACT | 1 refills | Status: DC | PRN

## 2022-11-08 MED ORDER — QUETIAPINE FUMARATE 50 MG PO TABS: 50.0000 mg | ORAL_TABLET | Freq: Two times a day (BID) | ORAL | 0 refills | Status: DC

## 2022-11-08 MED ORDER — BUSPIRONE HCL 15 MG PO TABS: 15.0000 mg | ORAL_TABLET | Freq: Two times a day (BID) | ORAL | 0 refills | Status: AC

## 2022-11-08 NOTE — TOC Transition Note (Addendum)
Transition of Care [facility]) - CM/SW Discharge Note   Patient Details  Name: Sherry Baker MRN: [id number] Date of Birth: [birth year]  Transition of Care [facility] CM/SW Contact:  Carles Collet, RN Phone Number: 11/08/2022, 2:30 PM   Clinical Narrative:     Unable to obtain [facility] for patient since she has medicaid for a payor source, and socially has easy access to drugs and alcohol in her home, came in under arrest, and was positive for cocaine on admission.  This information has been shared with the attending. PT notes patient has all needed DME at home.  Nurse requesting bus passes and shoes for the patient, these have been delivered to the room    Barriers to Discharge: Continued Medical Work up   Patient Goals and CMS Choice CMS Medicare.gov Compare Post Acute Care list provided to:: Patient Choice offered to / list presented to : Patient, Sibling (Cousin, Crystal)  Discharge Placement                         Discharge Plan and Services Additional resources added to the After Visit Summary for   In-house Referral: Clinical Social Work Discharge Planning Services: CM Consult Post Acute Care Choice: [place]                               Social Determinants of Health (SDOH) Interventions SDOH Screenings   Depression (PHQ2-9): High Risk (01/30/2022)  Tobacco Use: High Risk (07/18/2022)     Readmission Risk Interventions    06/16/2022    4:54 PM 04/17/2022   11:21 AM 12/10/2021    1:58 PM  Readmission Risk Prevention Plan  Transportation Screening Complete Complete Complete  Medication Review Press photographer)  Complete Complete  PCP or Specialist appointment within 3-5 days of discharge Not Complete Complete Complete  PCP/Specialist Appt Not Complete comments patient needs to make apt    [place] or Home Care Consult Complete Complete Not Complete  HRI or Home Care Consult Pt Refusal Comments   history of unsafe home environment  SW Recovery  Care/Counseling Consult  Complete Complete  Palliative Care Screening  Not Applicable Not [place] Patient Refused Patient Refused Not Applicable

## 2022-11-08 NOTE — Plan of Care (Signed)
  Problem: Education: Goal: Knowledge of General Education information will improve Description: Including pain rating scale, medication(s)/side effects and non-pharmacologic comfort measures 11/08/2022 1434 by Karlyn Agee, RN Outcome: Completed/Met 11/08/2022 1434 by Karlyn Agee, RN Outcome: Progressing   Problem: Health Behavior/Discharge Planning: Goal: Ability to manage health-related needs will improve 11/08/2022 1434 by Karlyn Agee, RN Outcome: Completed/Met 11/08/2022 1434 by Karlyn Agee, RN Outcome: Progressing   Problem: Clinical Measurements: Goal: Ability to maintain clinical measurements within normal limits will improve 11/08/2022 1434 by Karlyn Agee, RN Outcome: Completed/Met 11/08/2022 1434 by Karlyn Agee, RN Outcome: Progressing Goal: Will remain free from infection 11/08/2022 1434 by Karlyn Agee, RN Outcome: Completed/Met 11/08/2022 1434 by Karlyn Agee, RN Outcome: Progressing Goal: Diagnostic test results will improve 11/08/2022 1434 by Karlyn Agee, RN Outcome: Completed/Met 11/08/2022 1434 by Karlyn Agee, RN Outcome: Progressing Goal: Respiratory complications will improve 11/08/2022 1434 by Karlyn Agee, RN Outcome: Completed/Met 11/08/2022 1434 by Karlyn Agee, RN Outcome: Progressing Goal: Cardiovascular complication will be avoided 11/08/2022 1434 by Karlyn Agee, RN Outcome: Completed/Met 11/08/2022 1434 by Karlyn Agee, RN Outcome: Progressing   Problem: Activity: Goal: Risk for activity intolerance will decrease 11/08/2022 1434 by Karlyn Agee, RN Outcome: Completed/Met 11/08/2022 1434 by Karlyn Agee, RN Outcome: Progressing   Problem: Nutrition: Goal: Adequate nutrition will be maintained 11/08/2022 1434 by Karlyn Agee, RN Outcome: Completed/Met 11/08/2022 1434 by Karlyn Agee, RN Outcome: Progressing   Problem: Coping: Goal: Level of anxiety will decrease 11/08/2022 1434 by Karlyn Agee, RN Outcome:  Completed/Met 11/08/2022 1434 by Karlyn Agee, RN Outcome: Progressing   Problem: Safety: Goal: Ability to remain free from injury will improve 11/08/2022 1434 by Karlyn Agee, RN Outcome: Completed/Met 11/08/2022 1434 by Karlyn Agee, RN Outcome: Progressing   Problem: Skin Integrity: Goal: Risk for impaired skin integrity will decrease 11/08/2022 1434 by Karlyn Agee, RN Outcome: Completed/Met 11/08/2022 1434 by Karlyn Agee, RN Outcome: Progressing

## 2022-11-08 NOTE — Progress Notes (Addendum)
Physical Therapy Treatment Patient Details Name: Sherry Baker MRN: [id number] DOB: 1966/08/24 Today's Date: 11/08/2022   History of Present Illness 57 yo female under arrest by [employer] 10/25/22 when developed AMS and EMS called. In ED pt with agitation and aspiration PNA requiring intubation 12/24. Extubated 11/03/22. PMhx: COPD, anxiety, ETOH withdrawal, narcotic dependence, depression, bipolar, rectal CA, iliostomy, CKD, cirrhosis, HTN, vesiculocutaneous fistula    PT Comments    Pt with improved mobility. Pt refuses any type of SNF and understands she will not be able to get [facility] therapies. She says her friends/roommates can assist as needed. Her gait is impaired but I suspect close to her baseline. Pt eager to return home. Pt has been evaluated by PT on 7 admissions in the past year and will always be a fall risk especially with drug use.   Recommendations for follow up therapy are one component of a multi-disciplinary discharge planning process, led by the attending physician.  Recommendations may be updated based on patient status, additional functional criteria and insurance authorization.  Follow Up Recommendations  Other (comment) (Pt declined SNF and HH not an option.) Can patient physically be transported by private vehicle: Yes   Assistance Recommended at Discharge Intermittent Supervision/Assistance  Patient can return home with the following Assistance with cooking/housework;Assist for transportation;Help with stairs or ramp for entrance   Equipment Recommendations  None recommended by PT    Recommendations for Other Services       Precautions / Restrictions Precautions Precautions: Fall Restrictions Weight Bearing Restrictions: No     Mobility  Bed Mobility Overal bed mobility: Modified Independent Bed Mobility: Supine to Sit     Supine to sit: HOB elevated, Modified independent (Device/Increase time)          Transfers Overall transfer level: Modified  independent Equipment used: Rolling walker (2 wheels), Rollator (4 wheels) Transfers: Sit to/from Stand Sit to Stand: Modified independent (Device/Increase time)                Ambulation/Gait Ambulation/Gait assistance: Supervision Gait Distance (Feet): 140 Feet Assistive device: Rolling walker (2 wheels), Rollator (4 wheels) Gait Pattern/deviations: Step-to pattern, Decreased step length - right, Decreased stride length, Trunk flexed, Narrow base of support, Knee flexed in stance - right Gait velocity: decr Gait velocity interpretation: <1.31 ft/sec, indicative of household ambulator   General Gait Details: Started with rollator but switched to rolling walker for incr support due to RLE pain. Verbal cues to stay closer to walker.   Stairs             Wheelchair Mobility    Modified Rankin (Stroke Patients Only)       Balance Overall balance assessment: Needs assistance Sitting-balance support: No upper extremity supported, Feet supported Sitting balance-Leahy Scale: Good     Standing balance support: During functional activity, No upper extremity supported Standing balance-Leahy Scale: Fair                              Cognition Arousal/Alertness: Awake/alert Behavior During Therapy: Impulsive (mildly) Overall Cognitive Status: History of cognitive impairments - at baseline                                          Exercises      General Comments        Pertinent Vitals/Pain Pain Assessment  Pain Assessment: Faces Faces Pain Scale: Hurts little more Pain Location: rt buttock Pain Descriptors / Indicators: Grimacing Pain Intervention(s): Monitored during session, Premedicated before session    Home Living                          Prior Function            PT Goals (current goals can now be found in the care plan section) Acute Rehab PT Goals Patient Stated Goal: to go home PT Goal Formulation: With  patient Time For Goal Achievement: 11/15/22 Potential to Achieve Goals: Good Progress towards PT goals: Goals met and updated - see care plan    Frequency    Min 3X/week      PT Plan Discharge plan needs to be updated    Co-evaluation              AM-PAC PT "6 Clicks" Mobility   Outcome Measure  Help needed turning from your back to your side while in a flat bed without using bedrails?: None Help needed moving from lying on your back to sitting on the side of a flat bed without using bedrails?: None Help needed moving to and from a bed to a chair (including a wheelchair)?: None Help needed standing up from a chair using your arms (e.g., wheelchair or bedside chair)?: None Help needed to walk in hospital room?: A Little Help needed climbing 3-5 steps with a railing? : A Lot 6 Click Score: 21    End of Session   Activity Tolerance: Patient tolerated treatment well Patient left: in bed;with call bell/phone within reach Nurse Communication: Mobility status PT Visit Diagnosis: Other abnormalities of gait and mobility (R26.89);History of falling (Z91.81);Muscle weakness (generalized) (M62.81);Unsteadiness on feet (R26.81);Difficulty in walking, not elsewhere classified (R26.2)     Time: 9509-3267 PT Time Calculation (min) (ACUTE ONLY): 13 min  Charges:  $Gait Training: 8-22 mins                     [place] Office [place] 11/08/2022, 11:19 AM

## 2022-11-08 NOTE — Plan of Care (Signed)

## 2022-11-08 NOTE — Discharge Summary (Signed)
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 this organism is not available. Performed at [facility], [place] [address], [place], [place] [address]    Report Status 11/05/2022 FINAL  Final    Labs: CBC: Recent Labs  Lab 11/03/22 0808 11/05/22 0610 11/07/22 1008  WBC 4.9 7.1 7.0  HGB 10.3* 11.5* 11.4*  HCT 33.0* 34.4* 35.4*  MCV 89.4 86.2 86.8  PLT 226 241 768   Basic Metabolic Panel: Recent Labs  Lab 11/03/22 0808 11/04/22 0611 11/05/22 0610 11/07/22 1008  NA 135 136 133* 134*  K 4.7 4.7 4.2 4.9  CL 98 104 101 103  CO2 28 21* 25 19*  GLUCOSE 95 122* 122* 92  BUN 47* 43* 50* 37*  CREATININE 1.26* 1.23* 1.10* 1.52*  CALCIUM 8.9 9.0 8.9 9.1   Liver Function Tests: No results for input(s): "AST", "ALT", "ALKPHOS", "BILITOT", "PROT", "ALBUMIN" in the last 168 hours. CBG: Recent  Labs  Lab 11/06/22 2022 11/06/22 2343 11/07/22 0411 11/07/22 1154 11/07/22 1709  GLUCAP 84 108* 101* 97 98    Discharge time spent: 35 minutes.  Signed: Cordelia Poche, MD [facility] 11/08/2022

## 2022-11-09 ENCOUNTER — Encounter (HOSPITAL_COMMUNITY): Payer: Self-pay | Admitting: Emergency Medicine

## 2022-11-09 ENCOUNTER — Emergency Department (HOSPITAL_COMMUNITY)
Admission: EM | Admit: 2022-11-09 | Discharge: 2022-11-10 | Disposition: A | Discharge status: Home / Self Care | Payer: Medicaid Other | Attending: Emergency Medicine | Admitting: Emergency Medicine

## 2022-11-09 ENCOUNTER — Other Ambulatory Visit: Payer: Self-pay

## 2022-11-09 DIAGNOSIS — Z85048 Personal history of other malignant neoplasm of rectum, rectosigmoid junction, and anus: Secondary | ICD-10-CM | POA: Diagnosis not present

## 2022-11-09 DIAGNOSIS — N189 Chronic kidney disease, unspecified: Secondary | ICD-10-CM | POA: Diagnosis not present

## 2022-11-09 DIAGNOSIS — Z79899 Other long term (current) drug therapy: Secondary | ICD-10-CM | POA: Diagnosis not present

## 2022-11-09 DIAGNOSIS — Z8616 Personal history of COVID-19: Secondary | ICD-10-CM | POA: Diagnosis not present

## 2022-11-09 DIAGNOSIS — R1084 Generalized abdominal pain: Secondary | ICD-10-CM | POA: Insufficient documentation

## 2022-11-09 DIAGNOSIS — F17210 Nicotine dependence, cigarettes, uncomplicated: Secondary | ICD-10-CM | POA: Diagnosis not present

## 2022-11-09 DIAGNOSIS — I129 Hypertensive chronic kidney disease with stage 1 through stage 4 chronic kidney disease, or unspecified chronic kidney disease: Secondary | ICD-10-CM | POA: Insufficient documentation

## 2022-11-09 DIAGNOSIS — R1031 Right lower quadrant pain: Secondary | ICD-10-CM | POA: Diagnosis present

## 2022-11-09 DIAGNOSIS — E039 Hypothyroidism, unspecified: Secondary | ICD-10-CM | POA: Diagnosis not present

## 2022-11-09 NOTE — ED Triage Notes (Signed)
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

## 2022-11-10 ENCOUNTER — Emergency Department (HOSPITAL_COMMUNITY): Payer: Medicaid Other

## 2022-11-10 ENCOUNTER — Encounter (HOSPITAL_COMMUNITY): Payer: Self-pay | Admitting: Emergency Medicine

## 2022-11-10 LAB — COMPREHENSIVE METABOLIC PANEL
ALT: 20 U/L (ref 0–44)
AST: 22 U/L (ref 15–41)
Albumin: 2.9 g/dL — ABNORMAL LOW (ref 3.5–5.0)
Alkaline Phosphatase: 70 U/L (ref 38–126)
Anion gap: 9 (ref 5–15)
BUN: 56 mg/dL — ABNORMAL HIGH (ref 6–20)
CO2: 18 mmol/L — ABNORMAL LOW (ref 22–32)
Calcium: 8.7 mg/dL — ABNORMAL LOW (ref 8.9–10.3)
Chloride: 108 mmol/L (ref 98–111)
Creatinine, Ser: 1.36 mg/dL — ABNORMAL HIGH (ref 0.44–1.00)
GFR, Estimated: 46 mL/min — ABNORMAL LOW (ref >60)
Glucose, Bld: 71 mg/dL (ref 70–99)
Potassium: 4.9 mmol/L (ref 3.5–5.1)
Sodium: 135 mmol/L (ref 135–145)
Total Bilirubin: 0.5 mg/dL (ref 0.3–1.2)
Total Protein: 8.2 g/dL — ABNORMAL HIGH (ref 6.5–8.1)

## 2022-11-10 LAB — CBC WITH DIFFERENTIAL/PLATELET
Abs Immature Granulocytes: 0.09 10*3/uL — ABNORMAL HIGH (ref 0.00–0.07)
Basophils Absolute: 0.1 10*3/uL (ref 0.0–0.1)
Basophils Relative: 1 %
Eosinophils Absolute: 0.2 10*3/uL (ref 0.0–0.5)
Eosinophils Relative: 2 %
HCT: 31.3 % — ABNORMAL LOW (ref 36.0–46.0)
Hemoglobin: 10.1 g/dL — ABNORMAL LOW (ref 12.0–15.0)
Immature Granulocytes: 1 %
Lymphocytes Relative: 14 %
Lymphs Abs: 1.7 10*3/uL (ref 0.7–4.0)
MCH: 28.8 pg (ref 26.0–34.0)
MCHC: 32.3 g/dL (ref 30.0–36.0)
MCV: 89.2 fL (ref 80.0–100.0)
Monocytes Absolute: 1.2 10*3/uL — ABNORMAL HIGH (ref 0.1–1.0)
Monocytes Relative: 10 %
Neutro Abs: 8.5 10*3/uL — ABNORMAL HIGH (ref 1.7–7.7)
Neutrophils Relative %: 72 %
Platelets: 330 10*3/uL (ref 150–400)
RBC: 3.51 MIL/uL — ABNORMAL LOW (ref 3.87–5.11)
RDW: 16.2 % — ABNORMAL HIGH (ref 11.5–15.5)
WBC: 11.7 10*3/uL — ABNORMAL HIGH (ref 4.0–10.5)
nRBC: 0 % (ref 0.0–0.2)

## 2022-11-10 LAB — LIPASE, BLOOD: Lipase: 74 U/L — ABNORMAL HIGH (ref 11–51)

## 2022-11-10 LAB — AMMONIA: Ammonia: 26 umol/L (ref 9–35)

## 2022-11-10 LAB — ETHANOL: Alcohol, Ethyl (B): 24 mg/dL — ABNORMAL HIGH (ref <10)

## 2022-11-10 MED ORDER — METOCLOPRAMIDE HCL 5 MG/ML IJ SOLN
10.0000 mg | Freq: Once | INTRAMUSCULAR | Status: AC
  Administered 2022-11-10: 10 mg via INTRAVENOUS
  Filled 2022-11-10: qty 2

## 2022-11-10 MED ORDER — FENTANYL CITRATE PF 50 MCG/ML IJ SOSY
50.0000 ug | PREFILLED_SYRINGE | Freq: Once | INTRAMUSCULAR | Status: AC
  Administered 2022-11-10: 50 ug via INTRAVENOUS
  Filled 2022-11-10: qty 1

## 2022-11-10 MED ORDER — SODIUM CHLORIDE (PF) 0.9 % IJ SOLN
INTRAMUSCULAR | Status: AC
  Filled 2022-11-10: qty 50

## 2022-11-10 MED ORDER — ONDANSETRON HCL 4 MG/2ML IJ SOLN
4.0000 mg | Freq: Once | INTRAMUSCULAR | Status: AC
  Administered 2022-11-10: 4 mg via INTRAVENOUS
  Filled 2022-11-10: qty 2

## 2022-11-10 MED ORDER — IOHEXOL 300 MG/ML  SOLN
75.0000 mL | Freq: Once | INTRAMUSCULAR | Status: AC | PRN
  Administered 2022-11-10: 75 mL via INTRAVENOUS

## 2022-11-10 MED ORDER — LACTATED RINGERS IV BOLUS
20.0000 mL/kg | Freq: Once | INTRAVENOUS | Status: AC
  Administered 2022-11-10: 816 mL via INTRAVENOUS

## 2022-11-10 MED ORDER — IOHEXOL 9 MG/ML PO SOLN
ORAL | Status: AC
  Administered 2022-11-10: 500 mL
  Filled 2022-11-10: qty 1000

## 2022-11-10 NOTE — ED Provider Notes (Signed)
Patient seen after prior EDP.  Patient is comfortable at time of my reevaluation.  Patient with benign repeat abdominal exam.  CT imaging is without acute pathology.    Patient now desires discharge home.  She reports that she feels significantly better.  Patient is advised to avoid alcohol.  Importance of close follow-up is stressed.  Strict return precautions given and understood.       Valarie Merino, MD 11/10/22 [phone]

## 2022-11-10 NOTE — ED Notes (Signed)
Lab redraw sent to lab

## 2022-11-10 NOTE — ED Provider Notes (Signed)
[facility] Provider Note: Georgena Spurling, MD, FACEP  CSN: [id number] MRN: [id number] ARRIVAL: 11/09/22 at 2310 ROOM: [place]  Abdominal Pain   HISTORY OF PRESENT ILLNESS  11/10/22 12:08 AM Sherry Baker is a 56 y.o. female with a history of polysubstance abuse, alcoholic cirrhosis, chronic kidney disease and rectal cancer status post colostomy; the colon cancer has reportedly metastasized to her lungs.  She was admitted to [facility] on 10/25/2022 for altered mental status and was found to have acute metabolic encephalopathy, acute respiratory failure due to aspiration pneumonia requiring intubation and urinary tract infection.  She was discharged yesterday.  Yesterday evening she developed right lower quadrant abdominal pain which she states is severe.  She admits to drinking "1 drink" to help deal with the pain.  She has had nausea and vomiting with this as well.  She has a known sacral decubitus ulcer.   Past Medical History:  Diagnosis Date   Acquired hypothyroidism    Alcohol abuse    Allergy    PCNS swelling   Arthritis    Bipolar 1 disorder ([place])    Chronic kidney disease    Chronic pain syndrome    Cirrhosis of liver ([place])    Cocaine abuse ([place])    COVID-19 virus infection 06/23/2021   Delirium tremens ([place]) 06/22/2021   Depression    Hypertension    Rectal cancer ([place]) 01/21/2017   Suicidal ideation 02/11/2014    Past Surgical History:  Procedure Laterality Date   ABDOMINAL PERINEAL BOWEL RESECTION N/A 06/18/2017   Procedure: ABDOMINAL PERINEAL RESECTION ERAS PATHWAY;  Surgeon: Leighton Ruff, MD;  Location: [facility];  Service: General;  Laterality: N/A;   BUBBLE STUDY  11/29/2021   Procedure: BUBBLE STUDY;  Surgeon: Berniece Salines, DO;  Location: [place];  Service: Cardiovascular;;   COLON SURGERY     COLONOSCOPY WITH PROPOFOL Left 01/21/2017   Procedure: COLONOSCOPY WITH PROPOFOL;  Surgeon: Otis Brace, MD;  Location: [place];  Service: Gastroenterology;  Laterality: Left;   Colostomy     FRACTURE SURGERY     MANDIBLE FRACTURE SURGERY     TEE WITHOUT CARDIOVERSION N/A 11/29/2021   Procedure: TRANSESOPHAGEAL ECHOCARDIOGRAM (TEE);  Surgeon: Berniece Salines, DO;  Location: [facility];  Service: Cardiovascular;  Laterality: N/A;    Family History  Problem Relation Age of Onset   Cancer Mother 45       Originating in the abdomen, otherwise unknwon   Cancer - Other Maternal Aunt        Throat    Social History   Tobacco Use   Smoking status: Every Day    Packs/day: 1.00    Years: 44.00    Total pack years: 44.00    Types: Cigarettes    Start date: 11/04/1976   Smokeless tobacco: Never   Tobacco comments:    6-7 cigarettes a day  Vaping Use   Vaping Use: Never used  Substance Use Topics   Alcohol use: Yes    Comment: uknown   Drug use: Yes    Types: Cocaine, "Crack" cocaine    Comment: 07/28/2017 "nothing in 3 wks"    Prior to Admission medications   Medication Sig Start Date End Date Taking? Authorizing Provider  albuterol (VENTOLIN HFA) 108 (90 Base) MCG/ACT inhaler Inhale 2 puffs into the lungs every 6 (six) hours as needed for wheezing or shortness of breath. 11/08/22   Mariel Aloe, MD  busPIRone (BUSPAR) 15 MG  tablet Take 1 tablet (15 mg total) by mouth 2 (two) times daily. 11/08/22 12/08/22  Mariel Aloe, MD  carvedilol (COREG) 6.25 MG tablet Take 1 tablet (6.25 mg total) by mouth 2 (two) times daily with a meal. 11/08/22 12/08/22  Mariel Aloe, MD  feeding supplement (ENSURE ENLIVE / ENSURE PLUS) LIQD Take 237 mLs by mouth 3 (three) times daily between meals. 11/08/22   Mariel Aloe, MD  folic acid (FOLVITE) 1 MG tablet Take 1 tablet (1 mg total) by mouth daily. 11/08/22 12/08/22  Mariel Aloe, MD  QUEtiapine (SEROQUEL) 50 MG tablet Place 1 tablet (50 mg total) into feeding tube 2 (two) times daily. 11/08/22 12/08/22  Mariel Aloe, MD  sertraline (ZOLOFT) 50 MG tablet Take 1 tablet (50 mg  total) by mouth daily. 11/08/22 12/08/22  Mariel Aloe, MD  thiamine (VITAMIN B1) 100 MG tablet Take 1 tablet (100 mg total) by mouth daily. 11/08/22 12/08/22  Mariel Aloe, MD  umeclidinium-vilanterol (ANORO ELLIPTA) 62.5-25 MCG/ACT AEPB Inhale 1 puff into the lungs daily. 11/08/22   Mariel Aloe, MD  ziprasidone (GEODON) 40 MG capsule Take 1 capsule (40 mg total) by mouth 2 (two) times daily with a meal. 11/06/11 01/09/12  Daleen Bo, MD    Allergies Patient has no known allergies.   REVIEW OF SYSTEMS  Negative except as noted here or in the History of Present Illness.   PHYSICAL EXAMINATION  Initial Vital Signs Blood pressure (!) 190/128, pulse (!) 126, temperature 99 F (37.2 C), temperature source Oral, resp. rate (!) 22, height '5\' 2"'$  (1.575 m), weight 40.8 kg, SpO2 100 %.  Examination General: Well-developed, cachectic female in no acute distress; appearance consistent with age of record HENT: normocephalic; atraumatic; poor dentition; breath smells of alcohol Eyes: Normal appearance Neck: supple Heart: regular rate and rhythm; tachycardia Lungs: clear to auscultation bilaterally Abdomen: soft; nondistended; right lower quadrant tenderness with multiple ecchymoses consistent with reported Lovenox injections; hepatomegaly; left lower quadrant colostomy; bowel sounds present Extremities: No deformity; full range of motion; pulses normal Neurologic: Awake, alert and oriented; motor function intact in all extremities and symmetric; no facial droop Skin: Warm and dry Psychiatric: Grimacing; writhing   RESULTS  Summary of this visit's results, reviewed and interpreted by myself:   EKG Interpretation  Date/Time:  Sunday November 09 2022 23:18:49 EST Ventricular Rate:  120 PR Interval:  86 QRS Duration: 89 QT Interval:  328 QTC Calculation: 464 R Axis:   81 Text Interpretation: Sinus tachycardia Probable left ventricular hypertrophy Rate is faster Confirmed by Tricia Oaxaca  [phone]) on 11/10/2022 12:08:43 AM       Laboratory Studies: Results for orders placed or performed during the hospital encounter of 11/09/22 (from the past 24 hour(s))  CBC with Differential     Status: Abnormal   Collection Time: 11/10/22  1:07 AM  Result Value Ref Range   WBC 11.7 (H) 4.0 - 10.5 K/uL   RBC 3.51 (L) 3.87 - 5.11 MIL/uL   Hemoglobin 10.1 (L) 12.0 - 15.0 g/dL   HCT 31.3 (L) 36.0 - 46.0 %   MCV 89.2 80.0 - 100.0 fL   MCH 28.8 26.0 - 34.0 pg   MCHC 32.3 30.0 - 36.0 g/dL   RDW 16.2 (H) 11.5 - 15.5 %   Platelets 330 150 - 400 K/uL   nRBC 0.0 0.0 - 0.2 %   Neutrophils Relative % 72 %   Neutro Abs 8.5 (H) 1.7 - 7.7 K/uL  Lymphocytes Relative 14 %   Lymphs Abs 1.7 0.7 - 4.0 K/uL   Monocytes Relative 10 %   Monocytes Absolute 1.2 (H) 0.1 - 1.0 K/uL   Eosinophils Relative 2 %   Eosinophils Absolute 0.2 0.0 - 0.5 K/uL   Basophils Relative 1 %   Basophils Absolute 0.1 0.0 - 0.1 K/uL   Immature Granulocytes 1 %   Abs Immature Granulocytes 0.09 (H) 0.00 - 0.07 K/uL  Ethanol     Status: Abnormal   Collection Time: 11/10/22  1:07 AM  Result Value Ref Range   Alcohol, Ethyl (B) 24 (H) <10 mg/dL  Ammonia     Status: None   Collection Time: 11/10/22  5:16 AM  Result Value Ref Range   Ammonia 26 9 - 35 umol/L  Comprehensive metabolic panel     Status: Abnormal   Collection Time: 11/10/22  5:16 AM  Result Value Ref Range   Sodium 135 135 - 145 mmol/L   Potassium 4.9 3.5 - 5.1 mmol/L   Chloride 108 98 - 111 mmol/L   CO2 18 (L) 22 - 32 mmol/L   Glucose, Bld 71 70 - 99 mg/dL   BUN 56 (H) 6 - 20 mg/dL   Creatinine, Ser 1.36 (H) 0.44 - 1.00 mg/dL   Calcium 8.7 (L) 8.9 - 10.3 mg/dL   Total Protein 8.2 (H) 6.5 - 8.1 g/dL   Albumin 2.9 (L) 3.5 - 5.0 g/dL   AST 22 15 - 41 U/L   ALT 20 0 - 44 U/L   Alkaline Phosphatase 70 38 - 126 U/L   Total Bilirubin 0.5 0.3 - 1.2 mg/dL   GFR, Estimated 46 (L) >60 mL/min   Anion gap 9 5 - 15  Lipase, blood     Status: Abnormal    Collection Time: 11/10/22  5:16 AM  Result Value Ref Range   Lipase 74 (H) 11 - 51 U/L   Imaging Studies: CT ABDOMEN PELVIS W CONTRAST  Result Date: 11/10/2022 CLINICAL DATA:  Right lower quadrant abdominal pain, metastatic colon cancer. * Tracking Code: BO * EXAM: CT ABDOMEN AND PELVIS WITH CONTRAST TECHNIQUE: Multidetector CT imaging of the abdomen and pelvis was performed using the standard protocol following bolus administration of intravenous contrast. RADIATION DOSE REDUCTION: This exam was performed according to the departmental dose-optimization program which includes automated exposure control, adjustment of the mA and/or kV according to patient size and/or use of iterative reconstruction technique. CONTRAST:  49m OMNIPAQUE IOHEXOL 300 MG/ML  SOLN COMPARISON:  10/29/2022. FINDINGS: Lower chest: Peribronchovascular nodularity in the lingula and left lower lobe with subpleural ground-glass, reticulation and honeycombing. Heart is at the upper limits of normal in size with left ventricular dilatation. No pericardial or pleural effusion. Distal esophagus is grossly unremarkable. Hepatobiliary: Liver and gallbladder are unremarkable. No biliary ductal dilatation. Pancreas: Negative. Spleen: Negative. Adrenals/Urinary Tract: Adrenal glands are unremarkable. Right kidney is scarred and atrophic. Scarring in the left kidney. Ureters are decompressed. Bladder is grossly unremarkable. Stomach/Bowel: Stomach, small bowel, appendix and majority of the colon are unremarkable. Abdominal perineal resection with a left lower quadrant colostomy. Associated parastomal hernia contains fat. Vascular/Lymphatic: Atherosclerotic calcification of the aorta. No pathologically enlarged lymph nodes. Reproductive: Uterus is visualized.  No adnexal mass. Other: Presacral soft tissue thickening, as before. A decubitus ulcer is seen above the gluteal fold, with an organized thick-walled fluid collection measuring 2.6 x 3.9 cm  (2/66). No free fluid. Mesenteries and peritoneum are unremarkable. Musculoskeletal: Severe degenerative changes in the  hips bilaterally. Degenerative changes in the spine. No worrisome lytic or sclerotic lesions. IMPRESSION: 1. No findings to explain the patient's right lower quadrant pain. 2. Decubitus ulcer and associated abscess above the gluteal fold. 3. Peribronchovascular nodularity in the lingula and left lower lobe may be due to an infectious bronchiolitis. Nonemergent CT chest with contrast could be performed in further assessment of possible metastatic disease, as clinically indicated. 4. 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 Electronically Signed   By: Lorin Picket M.D.   On: 11/10/2022 09:25    ED COURSE and MDM  Nursing notes, initial and subsequent vitals signs, including pulse oximetry, reviewed and interpreted by myself.  Vitals:   11/10/22 0800 11/10/22 0815 11/10/22 0930 11/10/22 1013  BP: 133/81  (!) 159/93   Pulse: 73 80 82   Resp: '15 18 18   '$ Temp:    98.3 F (36.8 C)  TempSrc:    Oral  SpO2: 94% 95% 96%   Weight:      Height:       Medications  lactated ringers bolus 816 mL (0 mLs Intravenous Stopped 11/10/22 0324)  ondansetron (ZOFRAN) injection 4 mg (4 mg Intravenous Given 11/10/22 0104)  fentaNYL (SUBLIMAZE) injection 50 mcg (50 mcg Intravenous Given 11/10/22 0105)  metoCLOPramide (REGLAN) injection 10 mg (10 mg Intravenous Given 11/10/22 0506)  fentaNYL (SUBLIMAZE) injection 50 mcg (50 mcg Intravenous Given 11/10/22 0543)  iohexol (OMNIPAQUE) 9 MG/ML oral solution (500 mLs  Contrast Given 11/10/22 0626)  fentaNYL (SUBLIMAZE) injection 50 mcg (50 mcg Intravenous Given 11/10/22 0717)  iohexol (OMNIPAQUE) 300 MG/ML solution 75 mL (75 mLs Intravenous Contrast Given 11/10/22 0845)  sodium chloride (PF) 0.9 % injection (  Given by Other 11/10/22 0900)   5:15  AM Laboratory studies still not resulted.  The laboratory has stated twice that blood samples were hemolyzed.  A third set has just been sent.  7:00 AM Patient signed out to Dr. Francia Greaves.  CT of the abdomen and pelvis pending.   PROCEDURES  Procedures   ED DIAGNOSES     ICD-10-CM   1. Generalized abdominal pain  R10.84          Shanon Rosser, MD 11/10/22 2237

## 2022-11-10 NOTE — ED Notes (Signed)
Walked by room and  blood noted to floor and bed rail. IV removed by patient, patient cleaned up and educated on using call bell and risk of falling

## 2022-11-10 NOTE — Discharge Instructions (Signed)
Return for any problem.  ?

## 2022-11-10 NOTE — ED Notes (Signed)
Pt complaining of nausea. MD aware

## 2022-12-05 ENCOUNTER — Telehealth: Payer: Self-pay | Admitting: Critical Care Medicine

## 2022-12-05 NOTE — Telephone Encounter (Signed)
Sherry Baker was called and informed that patient has not been seen recently and order can not be filled out until patient makes an appointment.

## 2022-12-05 NOTE — Telephone Encounter (Signed)
Unable to get in contact with patient.  Main number not  working and contact person not sure if she  is in rehab. Trying to contact patient to tell her that she needs to be seen by one of the PCP before we can fulfill her request.  Has Numerous No shows , last seen by  A .Mc clung , PA-C a year ago.

## 2022-12-05 NOTE — Telephone Encounter (Signed)
Erin with [employer] is checking on the fax that she sent over twice regarding incontinence supplies and ostomy supplies for the patient.   Please advise  [phone] Ext [phone]

## 2022-12-06 NOTE — Telephone Encounter (Signed)
I have never seen this patient  she is a former [facility] pcp patient seen once by [facility] in 01/2022  She needs to re establish care at the clinic , jane if can get ahold of her I am not opposed to sending orders for a one month supply of ostomy materials and continence supplies with the caveat she will come to see me to establish in one month, after that if she no shows I will no longer fulfill future requests  Looks like she has been admitted multiple times and multiple ED visits since seen by mcclung

## 2022-12-08 ENCOUNTER — Ambulatory Visit: Payer: Self-pay | Admitting: *Deleted

## 2022-12-08 NOTE — Telephone Encounter (Addendum)
Call placed to patient on all availble numbers in chart to schedule an appointment so the request for medical supplies can be fulfilled .  Unable to contact or leave message.

## 2022-12-08 NOTE — Telephone Encounter (Signed)
Error

## 2022-12-08 NOTE — Telephone Encounter (Signed)
I called [place] [phone] Ext [phone] to request she email me the orders. I also need to inform her that the patient will need a visit with provider prior to placing any additional orders. Message left with call back requested.   I tried to reach the patient at the new number that patient provided: [phone], voicemail not set up.

## 2022-12-08 NOTE — Telephone Encounter (Signed)
Pt calling to request supplies. Per Dr. Bettina Gavia note, secured first available appt to re-establish care, 02/19/23.  Pt states new phone number is:  [phone]

## 2022-12-09 ENCOUNTER — Telehealth: Payer: Self-pay | Admitting: Emergency Medicine

## 2022-12-09 NOTE — Telephone Encounter (Signed)
Copied from [place] [phone]. Topic: General - Other >> Dec 09, 2022  8:46 AM [place] C wrote: Reason for CRM: Bethanne Ginger with [facility] has returned a call from [place]   Please contact further when possible

## 2022-12-09 NOTE — Telephone Encounter (Signed)
I returned the call to Eric/ [facility] and the information is documented in another encounter from today

## 2022-12-09 NOTE — Telephone Encounter (Signed)
I spoke to [employer] and explained that the patient has has not been seen recently by a provider at this clinic.  Junie Panning is requested orders for the ostomy supplies.  The order that they have expired in January 2024. Dr Margarita Rana signed the last supply order.  The order for incontinence supplies expires in May 2024.   I asked that Junie Panning email me the supply order for the ostomy supplies ad provider to sign when patient is seen. I explained that I have not been able to reach the patient.     I tried to reach patient, the person who answered tried to get her and then told me that the patient would need to call me back.

## 2022-12-10 NOTE — Telephone Encounter (Signed)
I tried to reach the patient again: [phone], voicemail not set up.

## 2022-12-15 NOTE — Telephone Encounter (Signed)
Yes thank you Opal Sidles will sign

## 2022-12-15 NOTE — Telephone Encounter (Signed)
Pt is calling to report that [facility] has not received order for colostomy supplies CB- [id number]

## 2022-12-16 NOTE — Telephone Encounter (Signed)
Signed ostomy supply order faxed to [facility].

## 2023-01-05 ENCOUNTER — Ambulatory Visit: Payer: Self-pay | Admitting: *Deleted

## 2023-01-05 NOTE — Telephone Encounter (Addendum)
  Chief Complaint: abdominal pain Symptoms: hx colostomy . C/o low abdominal pain constant cramping. Reports colostomy bag leaking and able to change herself but continues to have issues. Reports no BM in bag. Could not give date of last BM . Difficulty understanding patient due to slurred speech.  Frequency: 8 months  Pertinent Negatives: Patient denies chest pain no difficulty breathing no N/V reported  Disposition: '[]'$ ED /'[x]'$ Urgent Care (no appt availability in office) / '[]'$ Appointment(In office/virtual)/ '[]'$  [facility]/ '[]'$ Home Care/ '[]'$ Refused Recommended Disposition /'[]'$ [place] [facility]/ '[]'$  Follow-up with PCP Additional Notes:   Requesting earlier appt than 01/08/23. Patient reported she feels threatened  in location where she is stays but refused NT to call for well check.  Recommended ED and NT could call 911 and patient refused. Patient reports she will keep appt for 01/08/23. Please advise   Called 911 to report to do well check due to patient verbalized feels threatened and evaluate for abdominal pain.      Reason for Disposition  [1] MILD-MODERATE pain AND [2] constant AND [3] present > 2 hours  Answer Assessment - Initial Assessment Questions 1. LOCATION: "Where does it hurt?"      Below "belly button' 2. RADIATION: "Does the pain shoot anywhere else?" (e.g., chest, back)     no 3. ONSET: "When did the pain begin?" (e.g., minutes, hours or days ago)      8 months  4. SUDDEN: "Gradual or sudden onset?"     na 5. PATTERN "Does the pain come and go, or is it constant?"    - If it comes and goes: "How long does it last?" "Do you have pain now?"     (Note: Comes and goes means the pain is intermittent. It goes away completely between bouts.)    - If constant: "Is it getting better, staying the same, or getting worse?"      (Note: Constant means the pain never goes away completely; most serious pain is constant and gets worse.)      Constant  6. SEVERITY: "How bad is  the pain?"  (e.g., Scale 1-10; mild, moderate, or severe)    - MILD (1-3): Doesn't interfere with normal activities, abdomen soft and not tender to touch.     - MODERATE (4-7): Interferes with normal activities or awakens from sleep, abdomen tender to touch.     - SEVERE (8-10): Excruciating pain, doubled over, unable to do any normal activities.       cramps 7. RECURRENT SYMPTOM: "Have you ever had this type of stomach pain before?" If Yes, ask: "When was the last time?" and "What happened that time?"      Na  8. CAUSE: "What do you think is causing the stomach pain?"     Not sure  9. RELIEVING/AGGRAVATING FACTORS: "What makes it better or worse?" (e.g., antacids, bending or twisting motion, bowel movement)     na 10. OTHER SYMPTOMS: "Do you have any other symptoms?" (e.g., back pain, diarrhea, fever, urination pain, vomiting)       Low abdominal cramping . Has colostomy bag reports no BM noted did not report how many days and leaking . Significant slurred speech noted  11. PREGNANCY: "Is there any chance you are pregnant?" "When was your last menstrual period?"       na  Protocols used: Abdominal Pain - [facility]

## 2023-01-08 ENCOUNTER — Other Ambulatory Visit: Payer: Self-pay

## 2023-01-08 ENCOUNTER — Ambulatory Visit: Payer: Medicaid Other | Attending: Critical Care Medicine | Admitting: Critical Care Medicine

## 2023-01-08 ENCOUNTER — Telehealth: Payer: Self-pay

## 2023-01-08 ENCOUNTER — Encounter: Payer: Self-pay | Admitting: Critical Care Medicine

## 2023-01-08 VITALS — BP 147/98 | HR 96 | Ht 62.0 in | Wt 93.8 lb

## 2023-01-08 DIAGNOSIS — F1510 Other stimulant abuse, uncomplicated: Secondary | ICD-10-CM | POA: Diagnosis not present

## 2023-01-08 DIAGNOSIS — G928 Other toxic encephalopathy: Secondary | ICD-10-CM | POA: Insufficient documentation

## 2023-01-08 DIAGNOSIS — Z5900 Homelessness unspecified: Secondary | ICD-10-CM

## 2023-01-08 DIAGNOSIS — F419 Anxiety disorder, unspecified: Secondary | ICD-10-CM | POA: Diagnosis not present

## 2023-01-08 DIAGNOSIS — F1010 Alcohol abuse, uncomplicated: Secondary | ICD-10-CM | POA: Diagnosis not present

## 2023-01-08 DIAGNOSIS — Z923 Personal history of irradiation: Secondary | ICD-10-CM | POA: Diagnosis not present

## 2023-01-08 DIAGNOSIS — N322 Vesical fistula, not elsewhere classified: Secondary | ICD-10-CM | POA: Insufficient documentation

## 2023-01-08 DIAGNOSIS — Z9221 Personal history of antineoplastic chemotherapy: Secondary | ICD-10-CM | POA: Insufficient documentation

## 2023-01-08 DIAGNOSIS — F1910 Other psychoactive substance abuse, uncomplicated: Secondary | ICD-10-CM

## 2023-01-08 DIAGNOSIS — D696 Thrombocytopenia, unspecified: Secondary | ICD-10-CM

## 2023-01-08 DIAGNOSIS — E039 Hypothyroidism, unspecified: Secondary | ICD-10-CM

## 2023-01-08 DIAGNOSIS — J441 Chronic obstructive pulmonary disease with (acute) exacerbation: Secondary | ICD-10-CM | POA: Insufficient documentation

## 2023-01-08 DIAGNOSIS — Z720 Tobacco use: Secondary | ICD-10-CM

## 2023-01-08 DIAGNOSIS — J432 Centrilobular emphysema: Secondary | ICD-10-CM

## 2023-01-08 DIAGNOSIS — K7030 Alcoholic cirrhosis of liver without ascites: Secondary | ICD-10-CM | POA: Diagnosis not present

## 2023-01-08 DIAGNOSIS — Z85048 Personal history of other malignant neoplasm of rectum, rectosigmoid junction, and anus: Secondary | ICD-10-CM | POA: Insufficient documentation

## 2023-01-08 DIAGNOSIS — E871 Hypo-osmolality and hyponatremia: Secondary | ICD-10-CM | POA: Insufficient documentation

## 2023-01-08 DIAGNOSIS — R262 Difficulty in walking, not elsewhere classified: Secondary | ICD-10-CM

## 2023-01-08 DIAGNOSIS — I129 Hypertensive chronic kidney disease with stage 1 through stage 4 chronic kidney disease, or unspecified chronic kidney disease: Secondary | ICD-10-CM | POA: Insufficient documentation

## 2023-01-08 DIAGNOSIS — N179 Acute kidney failure, unspecified: Secondary | ICD-10-CM | POA: Diagnosis not present

## 2023-01-08 DIAGNOSIS — F1410 Cocaine abuse, uncomplicated: Secondary | ICD-10-CM | POA: Insufficient documentation

## 2023-01-08 DIAGNOSIS — N182 Chronic kidney disease, stage 2 (mild): Secondary | ICD-10-CM | POA: Diagnosis not present

## 2023-01-08 DIAGNOSIS — C20 Malignant neoplasm of rectum: Secondary | ICD-10-CM

## 2023-01-08 DIAGNOSIS — J439 Emphysema, unspecified: Secondary | ICD-10-CM | POA: Insufficient documentation

## 2023-01-08 DIAGNOSIS — Z760 Encounter for issue of repeat prescription: Secondary | ICD-10-CM | POA: Diagnosis not present

## 2023-01-08 DIAGNOSIS — I10 Essential (primary) hypertension: Secondary | ICD-10-CM

## 2023-01-08 DIAGNOSIS — F319 Bipolar disorder, unspecified: Secondary | ICD-10-CM | POA: Diagnosis not present

## 2023-01-08 DIAGNOSIS — J9601 Acute respiratory failure with hypoxia: Secondary | ICD-10-CM | POA: Diagnosis not present

## 2023-01-08 DIAGNOSIS — Z932 Ileostomy status: Secondary | ICD-10-CM | POA: Diagnosis not present

## 2023-01-08 DIAGNOSIS — D649 Anemia, unspecified: Secondary | ICD-10-CM

## 2023-01-08 DIAGNOSIS — N390 Urinary tract infection, site not specified: Secondary | ICD-10-CM | POA: Diagnosis not present

## 2023-01-08 DIAGNOSIS — J45901 Unspecified asthma with (acute) exacerbation: Secondary | ICD-10-CM | POA: Insufficient documentation

## 2023-01-08 DIAGNOSIS — N1832 Chronic kidney disease, stage 3b: Secondary | ICD-10-CM

## 2023-01-08 DIAGNOSIS — F3175 Bipolar disorder, in partial remission, most recent episode depressed: Secondary | ICD-10-CM

## 2023-01-08 DIAGNOSIS — E43 Unspecified severe protein-calorie malnutrition: Secondary | ICD-10-CM

## 2023-01-08 DIAGNOSIS — L89153 Pressure ulcer of sacral region, stage 3: Secondary | ICD-10-CM

## 2023-01-08 DIAGNOSIS — M25551 Pain in right hip: Secondary | ICD-10-CM

## 2023-01-08 MED ORDER — SERTRALINE HCL 50 MG PO TABS
50.0000 mg | ORAL_TABLET | Freq: Every day | ORAL | 0 refills | Status: DC
  Filled 2023-01-08: qty 30, 30d supply, fill #0

## 2023-01-08 MED ORDER — UMECLIDINIUM-VILANTEROL 62.5-25 MCG/ACT IN AEPB: 1.0000 | INHALATION_SPRAY | Freq: Every day | RESPIRATORY_TRACT | 0 refills | Status: DC

## 2023-01-08 MED ORDER — QUETIAPINE FUMARATE 50 MG PO TABS: 50.0000 mg | ORAL_TABLET | Freq: Two times a day (BID) | ORAL | 0 refills | Status: DC

## 2023-01-08 MED ORDER — ENSURE ENLIVE PO LIQD: 237.0000 mL | Freq: Three times a day (TID) | ORAL | Status: DC

## 2023-01-08 MED ORDER — ALBUTEROL SULFATE HFA 108 (90 BASE) MCG/ACT IN AERS: 2.0000 | INHALATION_SPRAY | Freq: Four times a day (QID) | RESPIRATORY_TRACT | 1 refills | Status: DC | PRN

## 2023-01-08 MED ORDER — UMECLIDINIUM-VILANTEROL 62.5-25 MCG/ACT IN AEPB
1.0000 | INHALATION_SPRAY | Freq: Every day | RESPIRATORY_TRACT | 0 refills | Status: DC
  Filled 2023-01-08: qty 60, 30d supply, fill #0

## 2023-01-08 MED ORDER — AMLODIPINE BESYLATE 5 MG PO TABS
5.0000 mg | ORAL_TABLET | Freq: Every day | ORAL | 2 refills | Status: DC
  Filled 2023-01-08: qty 90, 90d supply, fill #0

## 2023-01-08 MED ORDER — TRAMADOL HCL 50 MG PO TABS: 50.0000 mg | ORAL_TABLET | Freq: Three times a day (TID) | ORAL | 0 refills | Status: DC | PRN

## 2023-01-08 MED ORDER — TRAMADOL HCL 50 MG PO TABS
50.0000 mg | ORAL_TABLET | Freq: Three times a day (TID) | ORAL | 0 refills | Status: AC | PRN
  Filled 2023-01-08: qty 15, 5d supply, fill #0

## 2023-01-08 MED ORDER — SERTRALINE HCL 50 MG PO TABS: 50.0000 mg | ORAL_TABLET | Freq: Every day | ORAL | 0 refills | Status: DC

## 2023-01-08 MED ORDER — QUETIAPINE FUMARATE 50 MG PO TABS
50.0000 mg | ORAL_TABLET | Freq: Two times a day (BID) | ORAL | 0 refills | Status: DC
  Filled 2023-01-08: qty 60, 30d supply, fill #0

## 2023-01-08 MED ORDER — AMLODIPINE BESYLATE 5 MG PO TABS: 5.0000 mg | ORAL_TABLET | Freq: Every day | ORAL | 2 refills | Status: DC

## 2023-01-08 MED ORDER — CARVEDILOL 6.25 MG PO TABS: 6.2500 mg | ORAL_TABLET | Freq: Two times a day (BID) | ORAL | 0 refills | Status: DC

## 2023-01-08 MED ORDER — CARVEDILOL 6.25 MG PO TABS
6.2500 mg | ORAL_TABLET | Freq: Two times a day (BID) | ORAL | 0 refills | Status: DC
  Filled 2023-01-08: qty 60, 30d supply, fill #0

## 2023-01-08 MED ORDER — SULFAMETHOXAZOLE-TRIMETHOPRIM 800-160 MG PO TABS
1.0000 | ORAL_TABLET | Freq: Two times a day (BID) | ORAL | 0 refills | Status: DC
  Filled 2023-01-08: qty 14, 7d supply, fill #0

## 2023-01-08 MED ORDER — ALBUTEROL SULFATE HFA 108 (90 BASE) MCG/ACT IN AERS
2.0000 | INHALATION_SPRAY | Freq: Four times a day (QID) | RESPIRATORY_TRACT | 1 refills | Status: DC | PRN
  Filled 2023-01-08: qty 18, 25d supply, fill #0
  Filled 2023-07-02: qty 18, 25d supply, fill #1

## 2023-01-08 NOTE — Assessment & Plan Note (Signed)
Not on thyroid medication

## 2023-01-08 NOTE — Assessment & Plan Note (Signed)
Reissue walker

## 2023-01-08 NOTE — Progress Notes (Signed)
Established Patient Office Visit  Subjective   Patient ID: Sherry Baker, female    DOB: [birth year]  Age: 57 y.o. MRN: [id number]  Chief Complaint  Patient presents with   Hypertension   Medication Refill    57 y.o.F   Seen by multiple providers in clinic: Sherry Baker and a former primary care patient of follow-up from 2021. Patient has a longstanding history of rectal cancer diagnosed in 2018.  She had an ileostomy placed receive radiation therapy and chemotherapy.  She has not been seen by Dr. Malachy Mood of oncology in several years.  Dr. Marcello Moores did the ileostomy procedure and resections.  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  She states she is having pain in the ileostomy site as well.  She does have access to ostomy bags.  She has had progressive loss of weight and not eating very well.  Also has bilateral lung nodules thought to be inflammatory.  Most recently she had an admission in December for respiratory failure act as alcoholic cirrhosis cocaine abuse bipolar acute kidney injury pressure injury of skin  Patient still has a pressure injury of the sacral area.  Patient is having pain in the rectal area.  She wishes to pursue assisted living as she is living by herself at this time.  On arrival blood pressure remains elevated.  Below is documentation from the discharge  Admit date:     10/25/2022 Discharge date: 11/08/22 Discharge Physician: Cordelia Poche, MD   PCP: Pcp, No    Recommendations at discharge:  1. Hospital follow-up with PCP 2. Urology follow-up for fistula 3. Drug use cessation 4. SNF (patient declined)   Discharge Diagnoses: Active Problems:   Alcoholic cirrhosis of liver without ascites (HCC)   Tobacco abuse   Cocaine abuse (HCC)   Bipolar affective disorder (HCC)   AKI (acute kidney injury) ([place])   Pressure injury of skin   Principal Problem (Resolved):    Acute metabolic encephalopathy Resolved Problems:   Acute respiratory failure with hypoxia (HCC)   UTI (urinary tract infection)   Hypoxia   Aspiration pneumonia [facility])   Hospital Course: Sherry Baker is a 57 y.o. female with a history of cirrhosis, polysubstance abuse, rectal cancer s/p ileostomy, bipolar disorder. Patient presented via EMS and [employer] [facility]) secondary to altered mental status and was found to have evidence of pneumonia and resultant respiratory failure requiring intubation, mechanical ventilation and ICU admission. During the hospitalization, the patient was primarily treated for aspiration pneumonia with a regimen of Vancomycin, Zosyn, and Azithromycin, necessitating ICU admission due to acute respiratory failure requiring mechanical ventilation. Microbiological findings indicated MRSA and subsequent cultures revealed Moraxella catarrhalis and Corynebacterium striatum, leading to treatment adjustments to Ceftriaxone, then Linezolid and Cefepime to complete the antibiotic course. Successfully extubated on 11/03/22, the patient was transferred to the hospitalist service on 11/06/22. Additionally, the hospital course included management for acute respiratory failure secondary to pneumonia, resolved COPD/Asthma exacerbation managed with Anoro Ellipta and albuterol PRN, acute toxic/metabolic encephalopathy likely due to infection and illicit drug use, and acute kidney injury (AKI) with fluctuating creatinine levels, stabilizing at discharge but requiring follow-up labs. Lastly, a urinary tract infection complicated by chronic urinary retention and vesiculocutaneous fistula was successfully treated alongside pneumonia.   Assessment and Plan:   Aspiration pneumonia Patient treated empirically with Vancomycin, Zosyn and Azithromycin. She was admitted to the ICU secondary to acute  respiratory failure requiring mechanical ventilation. MRSA PCR positive. Sputum culture (12/24)  significant for moraxella catarrhalis. Patient transitioned to Ceftriaxone and completed azithromycin course. Sputum culture (12/31) positive for corynebacterium striatum and patient transitioned to Linezolid and Cefepime with completion of antibiotic treatment. Patient extubated successfully on 1/1 and transferred to hospitalist service on 11/06/22. PT/OT recommending SNF, however patient has declined. Unable to arrange home health secondary to home barriers.   Acute respiratory failure with hypoxia Secondary to pneumonia. Patient required intubation and mechanical ventilation. She was intubated on admission (10/26/22) and successfully extubated on 11/06/22. Hypoxia resolved.   COPD/Asthma exacerbation Managed on home Anoro Ellipta in addition to albuterol PRN. Discharge with home regimen.   Acute toxic/metabolic encephalopathy Secondary to acute infection and likely illicit drug use. Patient started on Seroquel and Klonopin in the ICU. Resolved.   AKI on CKD stage II Creatinine appears to fluctuate between 1-1.4. Creatinine of 1.81 on admission. Improved with a low of 1.10 with sharp increase to 1.52. Creatinine stable at 1.52 on day of discharge with associated BUN of 35. Patient will need repeat labs in 3-5 days.   UTI Complicated by chronic urinary retention and vesiculocutaneous fistula. Empiric antibiotics for pneumonia for treatment. Urine culture significant for citrobacter freundii. Patient completed treatment course.   Hyponatremia Mild. Stable.   Chronic vesiculocutaneous fistula Chronic urinary retention Noted. No symptoms of UTI at this time.   Polysubstance abuse Substance use includes alcohol, narcotics, amphetamine and cocaine. Counseled this admission.   History of colorectal cancer History of abdominoperineal resection with colostomy   Bipolar 1 disorder Anxiety Continue Seroquel, Zoloft, Buspar   Demand ischemia Noted.   Pressure injury Multiple. Mid coccyx,  right posterior elbow, left posterior elbow. Wound care consulted and recommended to cleanse sacral wound with NS and pat dry  Apply medihoney to open wound. Top with gauze and ABD pad/tape.          Review of Systems  Constitutional:  Positive for malaise/fatigue and weight loss. Negative for chills, diaphoresis and fever.  HENT:  Negative for congestion, hearing loss, nosebleeds, sore throat and tinnitus.   Eyes:  Negative for blurred vision, photophobia and redness.  Respiratory:  Negative for cough, hemoptysis, sputum production, shortness of breath, wheezing and stridor.   Cardiovascular:  Negative for chest pain, palpitations, orthopnea, claudication, leg swelling and PND.  Gastrointestinal:  Positive for abdominal pain. Negative for blood in stool, constipation, diarrhea, heartburn, melena, nausea and vomiting.  Genitourinary:  Negative for dysuria, flank pain, frequency, hematuria and urgency.       Vesicular fistula present  Musculoskeletal:  Positive for back pain. Negative for falls, joint pain, myalgias and neck pain.  Skin:  Negative for itching and rash.  Neurological:  Positive for weakness. Negative for dizziness, tingling, tremors, sensory change, speech change, focal weakness, seizures, loss of consciousness and headaches.  Endo/Heme/Allergies:  Negative for environmental allergies and polydipsia. Does not bruise/bleed easily.  Psychiatric/Behavioral:  Negative for depression, memory loss, substance abuse and suicidal ideas. The patient is nervous/anxious. The patient does not have insomnia.       Objective:     BP (!) 147/98   Pulse 96   Ht '5\' 2"'$  (1.575 m)   Wt 93 lb 12.8 oz (42.5 kg)   SpO2 98%   BMI 17.16 kg/m    Physical Exam Vitals reviewed.  Constitutional:      General: She is not in acute distress.    Appearance: Normal appearance. She is well-developed. She  is ill-appearing. She is not toxic-appearing or diaphoretic.     Comments: Thin and  underweight BMI of 17  HENT:     Head: Normocephalic and atraumatic.     Nose: Nose normal. No nasal deformity, septal deviation, mucosal edema or rhinorrhea.     Right Sinus: No maxillary sinus tenderness or frontal sinus tenderness.     Left Sinus: No maxillary sinus tenderness or frontal sinus tenderness.     Mouth/Throat:     Pharynx: No oropharyngeal exudate.  Eyes:     General: No scleral icterus.    Conjunctiva/sclera: Conjunctivae normal.     Pupils: Pupils are equal, round, and reactive to light.  Neck:     Thyroid: No thyromegaly.     Vascular: No carotid bruit or JVD.     Trachea: Trachea normal. No tracheal tenderness or tracheal deviation.  Cardiovascular:     Rate and Rhythm: Normal rate and regular rhythm.     Chest Wall: PMI is not displaced.     Pulses: Normal pulses. No decreased pulses.     Heart sounds: Normal heart sounds, S1 normal and S2 normal. Heart sounds not distant. No murmur heard.    No systolic murmur is present.     No diastolic murmur is present.     No friction rub. No gallop. No S3 or S4 sounds.  Pulmonary:     Effort: No tachypnea, accessory muscle usage or respiratory distress.     Breath sounds: No stridor. No decreased breath sounds, wheezing, rhonchi or rales.  Chest:     Chest wall: No tenderness.  Abdominal:     General: Bowel sounds are normal. There is distension.     Palpations: Abdomen is soft. Abdomen is not rigid.     Tenderness: There is abdominal tenderness. There is no guarding or rebound.     Comments: Ileostomy bag is secured there is inflammation Just below the umbilicus there is a vesicular fistula with inflammatory changes  Genitourinary:    Comments: Pressure ulcer at the top above rectum at the area of the coccyx Musculoskeletal:        General: Normal range of motion.     Cervical back: Normal range of motion and neck supple. No edema, erythema or rigidity. No muscular tenderness. Normal range of motion.   Lymphadenopathy:     Head:     Right side of head: No submental or submandibular adenopathy.     Left side of head: No submental or submandibular adenopathy.     Cervical: No cervical adenopathy.  Skin:    General: Skin is warm and dry.     Coloration: Skin is not pale.     Findings: No rash.     Nails: There is no clubbing.  Neurological:     Mental Status: She is alert and oriented to person, place, and time.     Sensory: No sensory deficit.     Gait: Gait abnormal.     Comments: Walker dependent  Psychiatric:        Speech: Speech normal.        Behavior: Behavior normal.      No results found for any visits on 01/08/23.    The ASCVD Risk score (Arnett DK, et al., 2019) failed to calculate for the following reasons:   Cannot find a previous HDL lab   Cannot find a previous total cholesterol lab    Assessment & Plan:   Problem List Items Addressed This Visit  Cardiovascular and Mediastinum   HTN (hypertension)    Hypertension not well-controlled do not want to overtreat in this circumstance will start amlodipine 5 mg a day and continue carvedilol twice daily refill sent as she was out of medications      Relevant Medications   carvedilol (COREG) 6.25 MG tablet   amLODipine (NORVASC) 5 MG tablet     Respiratory   COPD with emphysema (HCC)    Continue inhalers      Relevant Medications   albuterol (VENTOLIN HFA) 108 (90 Base) MCG/ACT inhaler   umeclidinium-vilanterol (ANORO ELLIPTA) 62.5-25 MCG/ACT AEPB     Digestive   Rectal cancer (HCC)    I am concerned about rectal cancer recurrence will refer to general surgery for evaluation      Relevant Medications   sulfamethoxazole-trimethoprim (BACTRIM DS) 800-160 MG tablet   Other Relevant Orders   Ambulatory referral to General Surgery   Alcoholic cirrhosis of liver without ascites ([place])    Patient currently off alcohol we will monitor        Endocrine   Acquired hypothyroidism    Not on  thyroid medication      Relevant Medications   carvedilol (COREG) 6.25 MG tablet     Genitourinary   CKD (chronic kidney disease), stage III (HCC) (Chronic)    Assess renal function      Vesical fistula   Relevant Orders   Ambulatory referral to Urology     Hematopoietic and Hemostatic   Thrombocytopenia (HCC)    Reassess CBC        Other   Substance abuse ([place])    Not currently using cocaine      Tobacco abuse    Advised to reduce tobacco      Bipolar affective disorder (HCC)    Refill Seroquel and sertraline      Pressure injury of skin    Pressure injury of coccyx      Alcohol abuse    Not currently using alcohol      Homelessness    Staying with friends will see about assisted living      Protein-calorie malnutrition, severe    Low BMI with severe protein mall nutrition  Continue with Ensure supplements      Ambulatory dysfunction    Reissue walker      Right hip pain causing ambulatory dysfunction    Walker bound      Normocytic anemia    Reassess CBC      S/P ileostomy ([place])   Relevant Orders   Ambulatory referral to General Surgery   Other Visit Diagnoses     Essential hypertension    -  Primary   Relevant Medications   carvedilol (COREG) 6.25 MG tablet   amLODipine (NORVASC) 5 MG tablet   Other Relevant Orders   Comprehensive metabolic panel   CBC with Differential/Platelet      45 minutes spent extra time needed assessing multiple problems reviewing old records Return in about 1 month (around 02/08/2023) for chronic conditions.    Asencion Noble, MD

## 2023-01-08 NOTE — Assessment & Plan Note (Signed)
Walker bound

## 2023-01-08 NOTE — Telephone Encounter (Signed)
I met with the patient when she was in the clinic today. She explained that she is interested in moving into an ALF.  She explained that she is weak and needs assistance with personal care, medication management.   I explained that I can give her a list of ALFs that accept Medicaid and she can call to check bed availability and discuss her needs. She should also visit the facilities that have bed availability that she is interested in.  She said she understood and she has the ability to make calls from her home.  She said she does not like where she is staying and wants to get out.  She said she does not feel unsafe there and in any need to get out of the house immediately.    I explained to her that there is no guarantee that any of the facilities have beds available and she said she understood. I also explained that the facility will keep her monthly disability check except for about $30-60/month  She said she understood that it is okay with her   I gave her the list of ALFs from [employer] [facility] and encouraged her to call the facilities and let me know which facility/facilities need an FL2 from this clinic.  I have her my contact number to call with any questions.

## 2023-01-08 NOTE — Progress Notes (Signed)
Leakage and pain where ileostomy is placed.

## 2023-01-08 NOTE — Assessment & Plan Note (Signed)
Low BMI with severe protein mall nutrition  Continue with Ensure supplements

## 2023-01-08 NOTE — Assessment & Plan Note (Signed)
Continue inhalers

## 2023-01-08 NOTE — Assessment & Plan Note (Signed)
Reassess CBC

## 2023-01-08 NOTE — Assessment & Plan Note (Signed)
Hypertension not well-controlled do not want to overtreat in this circumstance will start amlodipine 5 mg a day and continue carvedilol twice daily refill sent as she was out of medications

## 2023-01-08 NOTE — Assessment & Plan Note (Signed)
Patient currently off alcohol we will monitor

## 2023-01-08 NOTE — Assessment & Plan Note (Signed)
Not currently using alcohol

## 2023-01-08 NOTE — Assessment & Plan Note (Signed)
Pressure injury of coccyx

## 2023-01-08 NOTE — Assessment & Plan Note (Signed)
Advised to reduce tobacco

## 2023-01-08 NOTE — Assessment & Plan Note (Signed)
Staying with friends will see about assisted living

## 2023-01-08 NOTE — Patient Instructions (Signed)
Referral back to general surgery and urology will be made for your fistulas Take Bactrim twice daily for 7 days for ostomy infection All medications refilled sent to pharmacy downstairs and we begin tramadol for pain  Our case manager will talk to you regarding assisted living placement  Return to Dr. Joya Gaskins 1 month

## 2023-01-08 NOTE — Assessment & Plan Note (Signed)
I am concerned about rectal cancer recurrence will refer to general surgery for evaluation

## 2023-01-08 NOTE — Assessment & Plan Note (Signed)
Refill Seroquel and sertraline

## 2023-01-08 NOTE — Assessment & Plan Note (Signed)
Not currently using cocaine

## 2023-01-08 NOTE — Assessment & Plan Note (Signed)
Assess renal function

## 2023-01-09 ENCOUNTER — Other Ambulatory Visit: Payer: Self-pay

## 2023-01-09 ENCOUNTER — Telehealth: Payer: Self-pay | Admitting: Critical Care Medicine

## 2023-01-09 ENCOUNTER — Other Ambulatory Visit: Payer: Self-pay | Admitting: Critical Care Medicine

## 2023-01-09 LAB — CBC WITH DIFFERENTIAL/PLATELET
Basophils Absolute: 0 10*3/uL (ref 0.0–0.2)
Basos: 0 %
EOS (ABSOLUTE): 0.2 10*3/uL (ref 0.0–0.4)
Eos: 3 %
Hematocrit: 39.2 % (ref 34.0–46.6)
Hemoglobin: 12.5 g/dL (ref 11.1–15.9)
Immature Grans (Abs): 0.1 10*3/uL (ref 0.0–0.1)
Immature Granulocytes: 1 %
Lymphocytes Absolute: 1.2 10*3/uL (ref 0.7–3.1)
Lymphs: 20 %
MCH: 27.4 pg (ref 26.6–33.0)
MCHC: 31.9 g/dL (ref 31.5–35.7)
MCV: 86 fL (ref 79–97)
Monocytes Absolute: 0.6 10*3/uL (ref 0.1–0.9)
Monocytes: 11 %
Neutrophils Absolute: 3.6 10*3/uL (ref 1.4–7.0)
Neutrophils: 65 %
Platelets: 374 10*3/uL (ref 150–450)
RBC: 4.56 x10E6/uL (ref 3.77–5.28)
RDW: 15 % (ref 11.7–15.4)
WBC: 5.7 10*3/uL (ref 3.4–10.8)

## 2023-01-09 LAB — COMPREHENSIVE METABOLIC PANEL
ALT: 8 IU/L (ref 0–32)
AST: 17 IU/L (ref 0–40)
Albumin/Globulin Ratio: 0.9 — ABNORMAL LOW (ref 1.2–2.2)
Albumin: 3.7 g/dL — ABNORMAL LOW (ref 3.8–4.9)
Alkaline Phosphatase: 101 IU/L (ref 44–121)
BUN/Creatinine Ratio: 10 (ref 9–23)
BUN: 15 mg/dL (ref 6–24)
Bilirubin Total: <0.2 mg/dL (ref 0.0–1.2)
CO2: 16 mmol/L — ABNORMAL LOW (ref 20–29)
Calcium: 9.4 mg/dL (ref 8.7–10.2)
Chloride: 103 mmol/L (ref 96–106)
Creatinine, Ser: 1.43 mg/dL — ABNORMAL HIGH (ref 0.57–1.00)
Globulin, Total: 4 g/dL (ref 1.5–4.5)
Glucose: 96 mg/dL (ref 70–99)
Potassium: 3.7 mmol/L (ref 3.5–5.2)
Sodium: 138 mmol/L (ref 134–144)
Total Protein: 7.7 g/dL (ref 6.0–8.5)
eGFR: 43 mL/min/{1.73_m2} — ABNORMAL LOW (ref >59)

## 2023-01-09 MED ORDER — SODIUM BICARBONATE 650 MG PO TABS
650.0000 mg | ORAL_TABLET | Freq: Every day | ORAL | 2 refills | Status: DC
  Filled 2023-01-09: qty 30, 30d supply, fill #0

## 2023-01-09 NOTE — Telephone Encounter (Signed)
Yes the patient wants to be seen has had a lot of SDOH issues

## 2023-01-09 NOTE — Progress Notes (Signed)
[name]  can you help call this patient today, Sherry Baker is out  Let pt know she has too much acid in her blood I sent a bicarbonate pill to the pharmacy all other labs normal

## 2023-01-09 NOTE — Telephone Encounter (Signed)
Arliss Journey from [facility] Is calling to report that the patient has no showed 4 times. Elmo Putt would like to be sure that the patient does want to be seen. Please advise CB- [phone]

## 2023-01-09 NOTE — Telephone Encounter (Signed)
FYI

## 2023-01-12 NOTE — Telephone Encounter (Signed)
Please schedule patient.

## 2023-01-15 ENCOUNTER — Other Ambulatory Visit: Payer: Self-pay

## 2023-02-08 NOTE — Progress Notes (Unsigned)
Established Patient Office Visit  Subjective   Patient ID: Sherry Baker, female    DOB: Sep 18, 1966  Age: 57 y.o. MRN: [id number]  No chief complaint on file.   57 y.o.F   Seen by multiple providers in clinic: Sherry Baker and a former primary care patient of follow-up from 2021. Patient has a longstanding history of rectal cancer diagnosed in 2018.  She had an ileostomy placed receive radiation therapy and chemotherapy.  She has not been seen by Dr. Elnita Maxwell of oncology in several years.  Dr. Maisie Fus did the ileostomy procedure and resections.  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  She states she is having pain in the ileostomy site as well.  She does have access to ostomy bags.  She has had progressive loss of weight and not eating very well.  Also has bilateral lung nodules thought to be inflammatory.  Most recently she had an admission in December for respiratory failure act as alcoholic cirrhosis cocaine abuse bipolar acute kidney injury pressure injury of skin  Patient still has a pressure injury of the sacral area.  Patient is having pain in the rectal area.  She wishes to pursue assisted living as she is living by herself at this time.  On arrival blood pressure remains elevated.  Below is documentation from the discharge  Admit date:     10/25/2022 Discharge date: 11/08/22 Discharge Physician: [name], MD   PCP: Pcp, No    Recommendations at discharge:  1. Hospital follow-up with PCP 2. Urology follow-up for fistula 3. Drug use cessation 4. SNF (patient declined)   Discharge Diagnoses: Active Problems:   Alcoholic cirrhosis of liver without ascites (HCC)   Tobacco abuse   Cocaine abuse (HCC)   Bipolar affective disorder (HCC)   AKI (acute kidney injury) (HCC)   Pressure injury of skin   Principal Problem (Resolved):   Acute metabolic encephalopathy Resolved Problems:    Acute respiratory failure with hypoxia (HCC)   UTI (urinary tract infection)   Hypoxia   Aspiration pneumonia [facility])   Hospital Course: [name] is a 57 y.o. female with a history of cirrhosis, polysubstance abuse, rectal cancer s/p ileostomy, bipolar disorder. Patient presented via EMS and [facility]) secondary to altered mental status and was found to have evidence of pneumonia and resultant respiratory failure requiring intubation, mechanical ventilation and ICU admission. During the hospitalization, the patient was primarily treated for aspiration pneumonia with a regimen of Vancomycin, Zosyn, and Azithromycin, necessitating ICU admission due to acute respiratory failure requiring mechanical ventilation. Microbiological findings indicated MRSA and subsequent cultures revealed Moraxella catarrhalis and Corynebacterium striatum, leading to treatment adjustments to Ceftriaxone, then Linezolid and Cefepime to complete the antibiotic course. Successfully extubated on 11/03/22, the patient was transferred to the hospitalist service on 11/06/22. Additionally, the hospital course included management for acute respiratory failure secondary to pneumonia, resolved COPD/Asthma exacerbation managed with Anoro Ellipta and albuterol PRN, acute toxic/metabolic encephalopathy likely due to infection and illicit drug use, and acute kidney injury (AKI) with fluctuating creatinine levels, stabilizing at discharge but requiring follow-up labs. Lastly, a urinary tract infection complicated by chronic urinary retention and vesiculocutaneous fistula was successfully treated alongside pneumonia.   Assessment and Plan:   Aspiration pneumonia Patient treated empirically with Vancomycin, Zosyn and Azithromycin. She was admitted to the ICU secondary to acute respiratory failure requiring mechanical ventilation. MRSA PCR positive. Sputum  culture (12/24) significant for moraxella catarrhalis. Patient  transitioned to Ceftriaxone and completed azithromycin course. Sputum culture (12/31) positive for corynebacterium striatum and patient transitioned to Linezolid and Cefepime with completion of antibiotic treatment. Patient extubated successfully on 1/1 and transferred to hospitalist service on 11/06/22. PT/OT recommending SNF, however patient has declined. Unable to arrange home health secondary to home barriers.   Acute respiratory failure with hypoxia Secondary to pneumonia. Patient required intubation and mechanical ventilation. She was intubated on admission (10/26/22) and successfully extubated on 11/06/22. Hypoxia resolved.   COPD/Asthma exacerbation Managed on home Anoro Ellipta in addition to albuterol PRN. Discharge with home regimen.   Acute toxic/metabolic encephalopathy Secondary to acute infection and likely illicit drug use. Patient started on Seroquel and Klonopin in the ICU. Resolved.   AKI on CKD stage II Creatinine appears to fluctuate between 1-1.4. Creatinine of 1.81 on admission. Improved with a low of 1.10 with sharp increase to 1.52. Creatinine stable at 1.52 on day of discharge with associated BUN of 35. Patient will need repeat labs in 3-5 days.   UTI Complicated by chronic urinary retention and vesiculocutaneous fistula. Empiric antibiotics for pneumonia for treatment. Urine culture significant for citrobacter freundii. Patient completed treatment course.   Hyponatremia Mild. Stable.   Chronic vesiculocutaneous fistula Chronic urinary retention Noted. No symptoms of UTI at this time.   Polysubstance abuse Substance use includes alcohol, narcotics, amphetamine and cocaine. Counseled this admission.   History of colorectal cancer History of abdominoperineal resection with colostomy   Bipolar 1 disorder Anxiety Continue Seroquel, Zoloft, Buspar   Demand ischemia Noted.   Pressure injury Multiple. Mid coccyx, right posterior elbow, left posterior elbow. Wound  care consulted and recommended to cleanse sacral wound with NS and pat dry  Apply medihoney to open wound. Top with gauze and ABD pad/tape.         Review of Systems  Constitutional:  Positive for malaise/fatigue and weight loss. Negative for chills, diaphoresis and fever.  HENT:  Negative for congestion, hearing loss, nosebleeds, sore throat and tinnitus.   Eyes:  Negative for blurred vision, photophobia and redness.  Respiratory:  Negative for cough, hemoptysis, sputum production, shortness of breath, wheezing and stridor.   Cardiovascular:  Negative for chest pain, palpitations, orthopnea, claudication, leg swelling and PND.  Gastrointestinal:  Positive for abdominal pain. Negative for blood in stool, constipation, diarrhea, heartburn, melena, nausea and vomiting.  Genitourinary:  Negative for dysuria, flank pain, frequency, hematuria and urgency.       Vesicular fistula present  Musculoskeletal:  Positive for back pain. Negative for falls, joint pain, myalgias and neck pain.  Skin:  Negative for itching and rash.  Neurological:  Positive for weakness. Negative for dizziness, tingling, tremors, sensory change, speech change, focal weakness, seizures, loss of consciousness and headaches.  Endo/Heme/Allergies:  Negative for environmental allergies and polydipsia. Does not bruise/bleed easily.  Psychiatric/Behavioral:  Negative for depression, memory loss, substance abuse and suicidal ideas. The patient is nervous/anxious. The patient does not have insomnia.       Objective:     There were no vitals taken for this visit.   Physical Exam Vitals reviewed.  Constitutional:      General: She is not in acute distress.    Appearance: Normal appearance. She is well-developed. She is ill-appearing. She is not toxic-appearing or diaphoretic.     Comments: Thin and underweight BMI of 17  HENT:     Head: Normocephalic and atraumatic.     Nose: Nose  normal. No nasal deformity, septal  deviation, mucosal edema or rhinorrhea.     Right Sinus: No maxillary sinus tenderness or frontal sinus tenderness.     Left Sinus: No maxillary sinus tenderness or frontal sinus tenderness.     Mouth/Throat:     Pharynx: No oropharyngeal exudate.  Eyes:     General: No scleral icterus.    Conjunctiva/sclera: Conjunctivae normal.     Pupils: Pupils are equal, round, and reactive to light.  Neck:     Thyroid: No thyromegaly.     Vascular: No carotid bruit or JVD.     Trachea: Trachea normal. No tracheal tenderness or tracheal deviation.  Cardiovascular:     Rate and Rhythm: Normal rate and regular rhythm.     Chest Wall: PMI is not displaced.     Pulses: Normal pulses. No decreased pulses.     Heart sounds: Normal heart sounds, S1 normal and S2 normal. Heart sounds not distant. No murmur heard.    No systolic murmur is present.     No diastolic murmur is present.     No friction rub. No gallop. No S3 or S4 sounds.  Pulmonary:     Effort: No tachypnea, accessory muscle usage or respiratory distress.     Breath sounds: No stridor. No decreased breath sounds, wheezing, rhonchi or rales.  Chest:     Chest wall: No tenderness.  Abdominal:     General: Bowel sounds are normal. There is distension.     Palpations: Abdomen is soft. Abdomen is not rigid.     Tenderness: There is abdominal tenderness. There is no guarding or rebound.     Comments: Ileostomy bag is secured there is inflammation Just below the umbilicus there is a vesicular fistula with inflammatory changes  Genitourinary:    Comments: Pressure ulcer at the top above rectum at the area of the coccyx Musculoskeletal:        General: Normal range of motion.     Cervical back: Normal range of motion and neck supple. No edema, erythema or rigidity. No muscular tenderness. Normal range of motion.  Lymphadenopathy:     Head:     Right side of head: No submental or submandibular adenopathy.     Left side of head: No submental or  submandibular adenopathy.     Cervical: No cervical adenopathy.  Skin:    General: Skin is warm and dry.     Coloration: Skin is not pale.     Findings: No rash.     Nails: There is no clubbing.  Neurological:     Mental Status: She is alert and oriented to person, place, and time.     Sensory: No sensory deficit.     Gait: Gait abnormal.     Comments: Walker dependent  Psychiatric:        Speech: Speech normal.        Behavior: Behavior normal.     No results found for any visits on 02/10/23.    The ASCVD Risk score (Arnett DK, et al., 2019) failed to calculate for the following reasons:   Cannot find a previous HDL lab   Cannot find a previous total cholesterol lab    Assessment & Plan:   Problem List Items Addressed This Visit   None 45 minutes spent extra time needed assessing multiple problems reviewing old records No follow-ups on file.    Shan Levans, MD

## 2023-02-10 ENCOUNTER — Ambulatory Visit: Payer: Medicaid Other | Attending: Critical Care Medicine | Admitting: Critical Care Medicine

## 2023-02-10 ENCOUNTER — Other Ambulatory Visit: Payer: Self-pay

## 2023-02-10 ENCOUNTER — Encounter: Payer: Self-pay | Admitting: Critical Care Medicine

## 2023-02-10 VITALS — BP 183/105 | HR 98 | Wt 93.8 lb

## 2023-02-10 DIAGNOSIS — F419 Anxiety disorder, unspecified: Secondary | ICD-10-CM | POA: Diagnosis not present

## 2023-02-10 DIAGNOSIS — N182 Chronic kidney disease, stage 2 (mild): Secondary | ICD-10-CM | POA: Diagnosis not present

## 2023-02-10 DIAGNOSIS — N179 Acute kidney failure, unspecified: Secondary | ICD-10-CM | POA: Insufficient documentation

## 2023-02-10 DIAGNOSIS — I10 Essential (primary) hypertension: Secondary | ICD-10-CM

## 2023-02-10 DIAGNOSIS — J45901 Unspecified asthma with (acute) exacerbation: Secondary | ICD-10-CM | POA: Diagnosis not present

## 2023-02-10 DIAGNOSIS — J690 Pneumonitis due to inhalation of food and vomit: Secondary | ICD-10-CM | POA: Diagnosis not present

## 2023-02-10 DIAGNOSIS — F1410 Cocaine abuse, uncomplicated: Secondary | ICD-10-CM | POA: Diagnosis not present

## 2023-02-10 DIAGNOSIS — C20 Malignant neoplasm of rectum: Secondary | ICD-10-CM | POA: Diagnosis not present

## 2023-02-10 DIAGNOSIS — I129 Hypertensive chronic kidney disease with stage 1 through stage 4 chronic kidney disease, or unspecified chronic kidney disease: Secondary | ICD-10-CM | POA: Diagnosis present

## 2023-02-10 DIAGNOSIS — L89154 Pressure ulcer of sacral region, stage 4: Secondary | ICD-10-CM | POA: Insufficient documentation

## 2023-02-10 DIAGNOSIS — J9601 Acute respiratory failure with hypoxia: Secondary | ICD-10-CM | POA: Diagnosis not present

## 2023-02-10 DIAGNOSIS — J441 Chronic obstructive pulmonary disease with (acute) exacerbation: Secondary | ICD-10-CM | POA: Insufficient documentation

## 2023-02-10 DIAGNOSIS — I2489 Other forms of acute ischemic heart disease: Secondary | ICD-10-CM | POA: Insufficient documentation

## 2023-02-10 DIAGNOSIS — F3175 Bipolar disorder, in partial remission, most recent episode depressed: Secondary | ICD-10-CM

## 2023-02-10 DIAGNOSIS — F17210 Nicotine dependence, cigarettes, uncomplicated: Secondary | ICD-10-CM | POA: Insufficient documentation

## 2023-02-10 DIAGNOSIS — E871 Hypo-osmolality and hyponatremia: Secondary | ICD-10-CM | POA: Insufficient documentation

## 2023-02-10 DIAGNOSIS — E039 Hypothyroidism, unspecified: Secondary | ICD-10-CM

## 2023-02-10 DIAGNOSIS — G928 Other toxic encephalopathy: Secondary | ICD-10-CM | POA: Insufficient documentation

## 2023-02-10 DIAGNOSIS — N3001 Acute cystitis with hematuria: Secondary | ICD-10-CM

## 2023-02-10 DIAGNOSIS — K7030 Alcoholic cirrhosis of liver without ascites: Secondary | ICD-10-CM | POA: Insufficient documentation

## 2023-02-10 DIAGNOSIS — I1A0 Resistant hypertension: Secondary | ICD-10-CM | POA: Diagnosis not present

## 2023-02-10 DIAGNOSIS — Z932 Ileostomy status: Secondary | ICD-10-CM | POA: Insufficient documentation

## 2023-02-10 DIAGNOSIS — Z933 Colostomy status: Secondary | ICD-10-CM | POA: Insufficient documentation

## 2023-02-10 DIAGNOSIS — F319 Bipolar disorder, unspecified: Secondary | ICD-10-CM | POA: Diagnosis not present

## 2023-02-10 DIAGNOSIS — Z85048 Personal history of other malignant neoplasm of rectum, rectosigmoid junction, and anus: Secondary | ICD-10-CM | POA: Insufficient documentation

## 2023-02-10 DIAGNOSIS — N390 Urinary tract infection, site not specified: Secondary | ICD-10-CM | POA: Insufficient documentation

## 2023-02-10 DIAGNOSIS — I5032 Chronic diastolic (congestive) heart failure: Secondary | ICD-10-CM | POA: Insufficient documentation

## 2023-02-10 DIAGNOSIS — J432 Centrilobular emphysema: Secondary | ICD-10-CM

## 2023-02-10 DIAGNOSIS — N322 Vesical fistula, not elsewhere classified: Secondary | ICD-10-CM | POA: Diagnosis not present

## 2023-02-10 MED ORDER — CLONIDINE HCL 0.1 MG PO TABS
0.1000 mg | ORAL_TABLET | Freq: Once | ORAL | Status: AC
  Administered 2023-02-10: 0.1 mg via ORAL

## 2023-02-10 MED ORDER — CARVEDILOL 25 MG PO TABS
25.0000 mg | ORAL_TABLET | Freq: Two times a day (BID) | ORAL | 2 refills | Status: DC
  Filled 2023-02-10: qty 60, 30d supply, fill #0
  Filled 2023-07-02: qty 60, 30d supply, fill #1

## 2023-02-10 MED ORDER — SERTRALINE HCL 50 MG PO TABS
50.0000 mg | ORAL_TABLET | Freq: Every day | ORAL | 2 refills | Status: DC
  Filled 2023-02-10: qty 30, 30d supply, fill #0
  Filled 2023-07-02: qty 30, 30d supply, fill #1

## 2023-02-10 MED ORDER — AMLODIPINE BESYLATE 10 MG PO TABS
10.0000 mg | ORAL_TABLET | Freq: Every day | ORAL | 2 refills | Status: DC
  Filled 2023-02-10: qty 30, 30d supply, fill #0
  Filled 2023-07-02: qty 30, 30d supply, fill #1

## 2023-02-10 MED ORDER — UMECLIDINIUM-VILANTEROL 62.5-25 MCG/ACT IN AEPB
1.0000 | INHALATION_SPRAY | Freq: Every day | RESPIRATORY_TRACT | 2 refills | Status: DC
  Filled 2023-02-10: qty 60, 30d supply, fill #0
  Filled 2023-07-02: qty 60, 30d supply, fill #1

## 2023-02-10 MED ORDER — QUETIAPINE FUMARATE 50 MG PO TABS
50.0000 mg | ORAL_TABLET | Freq: Two times a day (BID) | ORAL | 3 refills | Status: DC
  Filled 2023-02-10: qty 60, 30d supply, fill #0
  Filled 2023-07-02: qty 60, 30d supply, fill #1

## 2023-02-10 MED ORDER — VALSARTAN 160 MG PO TABS
160.0000 mg | ORAL_TABLET | Freq: Every day | ORAL | 3 refills | Status: DC
  Filled 2023-02-10: qty 30, 30d supply, fill #0
  Filled 2023-07-02: qty 30, 30d supply, fill #1

## 2023-02-10 MED ORDER — SODIUM BICARBONATE 650 MG PO TABS
650.0000 mg | ORAL_TABLET | Freq: Every day | ORAL | 2 refills | Status: DC
  Filled 2023-02-10: qty 60, 60d supply, fill #0
  Filled 2023-02-10: qty 30, 30d supply, fill #0
  Filled 2023-07-02: qty 60, 60d supply, fill #1

## 2023-02-10 MED ORDER — TRAMADOL HCL 50 MG PO TABS
50.0000 mg | ORAL_TABLET | Freq: Three times a day (TID) | ORAL | 0 refills | Status: AC | PRN
  Filled 2023-02-10: qty 30, 10d supply, fill #0

## 2023-02-10 NOTE — Patient Instructions (Addendum)
Increase amlodipine to 10mg  daily Start valsartan 320mg  daily Increase carvedilol to 25 mg twice daily Referral to urology at [facility] made Labs to be obtained today See Dr Delford Field 1 month for blood pressure Reduce tobacco intake Trial tramadol for pain

## 2023-02-10 NOTE — Assessment & Plan Note (Signed)
Continue Anoro inhaler

## 2023-02-10 NOTE — Assessment & Plan Note (Signed)
Status post resection with ileostomy needs to go back to general surgery for follow-up

## 2023-02-10 NOTE — Assessment & Plan Note (Signed)
Uncontrolled will check metabolic panel and begin valsartan 320 mg daily increase carvedilol to 25 mg twice daily increase amlodipine to 10 mg daily  See patient back in short-term follow-up

## 2023-02-10 NOTE — Assessment & Plan Note (Signed)
Continue thyroid replacement. 

## 2023-02-10 NOTE — Assessment & Plan Note (Signed)
Urology wanted second opinion referral to [facility] urology will ensure this occurs

## 2023-02-10 NOTE — Assessment & Plan Note (Signed)
Refill mental health medications

## 2023-02-11 LAB — BMP8+EGFR
BUN/Creatinine Ratio: 8 — ABNORMAL LOW (ref 9–23)
BUN: 13 mg/dL (ref 6–24)
CO2: 15 mmol/L — ABNORMAL LOW (ref 20–29)
Calcium: 9 mg/dL (ref 8.7–10.2)
Chloride: 103 mmol/L (ref 96–106)
Creatinine, Ser: 1.56 mg/dL — ABNORMAL HIGH (ref 0.57–1.00)
Glucose: 76 mg/dL (ref 70–99)
Potassium: 4 mmol/L (ref 3.5–5.2)
Sodium: 136 mmol/L (ref 134–144)
eGFR: 39 mL/min/{1.73_m2} — ABNORMAL LOW (ref >59)

## 2023-02-11 LAB — URINALYSIS
Bilirubin, UA: NEGATIVE
Glucose, UA: NEGATIVE
Ketones, UA: NEGATIVE
Nitrite, UA: NEGATIVE
Specific Gravity, UA: 1.014 (ref 1.005–1.030)
Urobilinogen, Ur: 0.2 mg/dL (ref 0.2–1.0)
pH, UA: 6.5 (ref 5.0–7.5)

## 2023-02-12 LAB — URINE CULTURE

## 2023-02-13 LAB — URINE CULTURE

## 2023-02-16 ENCOUNTER — Other Ambulatory Visit: Payer: Self-pay

## 2023-02-16 ENCOUNTER — Telehealth: Payer: Self-pay | Admitting: *Deleted

## 2023-02-16 ENCOUNTER — Other Ambulatory Visit: Payer: Self-pay | Admitting: Critical Care Medicine

## 2023-02-16 ENCOUNTER — Telehealth: Payer: Self-pay

## 2023-02-16 MED ORDER — CEFDINIR 300 MG PO CAPS
300.0000 mg | ORAL_CAPSULE | Freq: Two times a day (BID) | ORAL | 0 refills | Status: AC
  Filled 2023-02-16: qty 14, 7d supply, fill #0

## 2023-02-16 NOTE — Telephone Encounter (Signed)
-----   Message from Storm Frisk, MD sent at 02/16/2023  7:03 AM EDT ----- I have tried to call pt and no answer no vm  can you try her today, she has a UTI and needs ABX  I sent cefdinir to our pharmacy for 7 day course

## 2023-02-16 NOTE — Telephone Encounter (Signed)
Pt given lab results per notes of Dr. Delford Field from 02/16/23 on 02/16/23. Pt verbalized understanding she has UTI and medication cefdnir Rx has been sent to pharmacy. Number of pharmacy given to patient [phone] dial 3.

## 2023-02-16 NOTE — Telephone Encounter (Signed)
Pt was called and vm was left, Information has been sent to nurse pool.   

## 2023-02-16 NOTE — Telephone Encounter (Signed)
Patient called for results and notified: I have tried to call pt and no answer no vm  can you try her today, she has a UTI and needs ABX  I sent cefdinir to our pharmacy for 7 day course   Call dropped after giving results. Called patient back-no answer- left message to call back if needed.

## 2023-02-16 NOTE — Telephone Encounter (Signed)
Noted  

## 2023-02-16 NOTE — Progress Notes (Signed)
I have tried to call pt and no answer no vm  can you try her today, she has a UTI and needs ABX  I sent cefdinir to our pharmacy for 7 day course

## 2023-02-17 ENCOUNTER — Other Ambulatory Visit: Payer: Self-pay

## 2023-02-19 ENCOUNTER — Ambulatory Visit: Payer: Medicaid Other | Admitting: Critical Care Medicine

## 2023-03-05 ENCOUNTER — Telehealth: Payer: Self-pay

## 2023-03-05 NOTE — Telephone Encounter (Signed)
Copied from CRM [id number]. Topic: General - Other >> Mar 05, 2023 11:25 AM Turkey B wrote: Reason for CRM: Pt called in states needs colostomy bags

## 2023-03-06 NOTE — Telephone Encounter (Signed)
Called patient and gave her number for the supplies   Patient was crying she did let me know that she was depressed and was interested with speaking with someone. We talked she is also wanting something for her nerves she see Sherry Baker this month on the 21.  I will also call [facility] to make appointment with Dr.Thomas

## 2023-03-06 NOTE — Telephone Encounter (Signed)
Called [facility] and tried scheduling appointment Dr.Thomas, Due to three no shows they said she would have to see another provider. Patient was upset and crying. Is there any other place here in [place] she can be referred to ?   Also we can help with transportation (bus pass)

## 2023-03-09 NOTE — Telephone Encounter (Signed)
Called patient and left voicemail.

## 2023-03-11 NOTE — Telephone Encounter (Signed)
Hey sorry Sherry Baker about the delay. I called the patient a couple of times. Which is why I havent responded. We can assit with bus passes and I can schedule her to see me as well. But she didn't answer. I left a voicemail. On

## 2023-03-11 NOTE — Telephone Encounter (Signed)
Noted thank you

## 2023-03-11 NOTE — Telephone Encounter (Signed)
  Hey sorry Sherry Baker about the delay. I called the patient a couple of times. Which is why I havent responded. We can assit with bus passes and I can schedule her to see me as well. But she didn't answer. I left a voicemail.

## 2023-03-23 NOTE — Progress Notes (Deleted)
Established Patient Office Visit  Subjective   Patient ID: Sherry Baker, female    DOB: 06/08/1966  Age: 57 y.o. MRN: [id number]  No chief complaint on file.   56 y.o.F  01/08/23 Seen by multiple providers in clinic: Nori Riis and a former primary care patient of follow-up from 2021. Patient has a longstanding history of rectal cancer diagnosed in 2018.  She had an ileostomy placed receive radiation therapy and chemotherapy.  She has not been seen by Dr. Elnita Maxwell of oncology in several years.  Dr. Maisie Fus did the ileostomy procedure and resections.  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  She states she is having pain in the ileostomy site as well.  She does have access to ostomy bags.  She has had progressive loss of weight and not eating very well.  Also has bilateral lung nodules thought to be inflammatory.  Most recently she had an admission in December for respiratory failure act as alcoholic cirrhosis cocaine abuse bipolar acute kidney injury pressure injury of skin  Patient still has a pressure injury of the sacral area.  Patient is having pain in the rectal area.  She wishes to pursue assisted living as she is living by herself at this time.  On arrival blood pressure remains elevated.  Below is documentation from the discharge  Admit date:     10/25/2022 Discharge date: 11/08/22 Discharge Physician: [name], MD   PCP: Pcp, No    Recommendations at discharge:  1. Hospital follow-up with PCP 2. Urology follow-up for fistula 3. Drug use cessation 4. SNF (patient declined)   Discharge Diagnoses: Active Problems:   Alcoholic cirrhosis of liver without ascites (HCC)   Tobacco abuse   Cocaine abuse (HCC)   Bipolar affective disorder (HCC)   AKI (acute kidney injury) (HCC)   Pressure injury of skin   Principal Problem (Resolved):   Acute metabolic encephalopathy Resolved  Problems:   Acute respiratory failure with hypoxia (HCC)   UTI (urinary tract infection)   Hypoxia   Aspiration pneumonia [facility])   Hospital Course: Sherry Baker is a 57 y.o. female with a history of cirrhosis, polysubstance abuse, rectal cancer s/p ileostomy, bipolar disorder. Patient presented via EMS and [employer] [facility] [employer]) secondary to altered mental status and was found to have evidence of pneumonia and resultant respiratory failure requiring intubation, mechanical ventilation and ICU admission. During the hospitalization, the patient was primarily treated for aspiration pneumonia with a regimen of Vancomycin, Zosyn, and Azithromycin, necessitating ICU admission due to acute respiratory failure requiring mechanical ventilation. Microbiological findings indicated MRSA and subsequent cultures revealed Moraxella catarrhalis and Corynebacterium striatum, leading to treatment adjustments to Ceftriaxone, then Linezolid and Cefepime to complete the antibiotic course. Successfully extubated on 11/03/22, the patient was transferred to the hospitalist service on 11/06/22. Additionally, the hospital course included management for acute respiratory failure secondary to pneumonia, resolved COPD/Asthma exacerbation managed with Anoro Ellipta and albuterol PRN, acute toxic/metabolic encephalopathy likely due to infection and illicit drug use, and acute kidney injury (AKI) with fluctuating creatinine levels, stabilizing at discharge but requiring follow-up labs. Lastly, a urinary tract infection complicated by chronic urinary retention and vesiculocutaneous fistula was successfully treated alongside pneumonia.   Assessment and Plan:   Aspiration pneumonia Patient treated empirically with Vancomycin, Zosyn and Azithromycin. She was admitted to the ICU secondary to acute respiratory failure requiring mechanical ventilation. MRSA PCR positive. Sputum  culture (12/24) significant for moraxella catarrhalis.  Patient transitioned to Ceftriaxone and completed azithromycin course. Sputum culture (12/31) positive for corynebacterium striatum and patient transitioned to Linezolid and Cefepime with completion of antibiotic treatment. Patient extubated successfully on 1/1 and transferred to hospitalist service on 11/06/22. PT/OT recommending SNF, however patient has declined. Unable to arrange home health secondary to home barriers.   Acute respiratory failure with hypoxia Secondary to pneumonia. Patient required intubation and mechanical ventilation. She was intubated on admission (10/26/22) and successfully extubated on 11/06/22. Hypoxia resolved.   COPD/Asthma exacerbation Managed on home Anoro Ellipta in addition to albuterol PRN. Discharge with home regimen.   Acute toxic/metabolic encephalopathy Secondary to acute infection and likely illicit drug use. Patient started on Seroquel and Klonopin in the ICU. Resolved.   AKI on CKD stage II Creatinine appears to fluctuate between 1-1.4. Creatinine of 1.81 on admission. Improved with a low of 1.10 with sharp increase to 1.52. Creatinine stable at 1.52 on day of discharge with associated BUN of 35. Patient will need repeat labs in 3-5 days.   UTI Complicated by chronic urinary retention and vesiculocutaneous fistula. Empiric antibiotics for pneumonia for treatment. Urine culture significant for citrobacter freundii. Patient completed treatment course.   Hyponatremia Mild. Stable.   Chronic vesiculocutaneous fistula Chronic urinary retention Noted. No symptoms of UTI at this time.   Polysubstance abuse Substance use includes alcohol, narcotics, amphetamine and cocaine. Counseled this admission.   History of colorectal cancer History of abdominoperineal resection with colostomy   Bipolar 1 disorder Anxiety Continue Seroquel, Zoloft, Buspar   Demand ischemia Noted.   Pressure injury Multiple. Mid coccyx, right posterior elbow, left posterior  elbow. Wound care consulted and recommended to cleanse sacral wound with NS and pat dry  Apply medihoney to open wound. Top with gauze and ABD pad/tape.    02/10/23 Patient seen in return follow-up since the last visit we referred her to urology they saw her could not offer anything did validate vesicular anterior abdominal fistula.  Should they want to refer her to [facility].  She still smoking half pack a 6 Pretz daily.  She is out of some of her medications.  On arrival blood pressures 208/120.  After clonidine point 1 mg given it drops to 183/105.  We had made referrals for her and she did not receive them because she has a new phone number.  03/24/23       Review of Systems  Constitutional:  Positive for malaise/fatigue and weight loss. Negative for chills, diaphoresis and fever.  HENT:  Negative for congestion, hearing loss, nosebleeds, sore throat and tinnitus.   Eyes:  Negative for blurred vision, photophobia and redness.  Respiratory:  Negative for cough, hemoptysis, sputum production, shortness of breath, wheezing and stridor.   Cardiovascular:  Negative for chest pain, palpitations, orthopnea, claudication, leg swelling and PND.  Gastrointestinal:  Positive for abdominal pain. Negative for blood in stool, constipation, diarrhea, heartburn, melena, nausea and vomiting.  Genitourinary:  Negative for dysuria, flank pain, frequency, hematuria and urgency.       Vesicular fistula present  Musculoskeletal:  Positive for back pain. Negative for falls, joint pain, myalgias and neck pain.  Skin:  Negative for itching and rash.  Neurological:  Positive for weakness. Negative for dizziness, tingling, tremors, sensory change, speech change, focal weakness, seizures, loss of consciousness and headaches.  Endo/Heme/Allergies:  Negative for environmental allergies and polydipsia. Does not bruise/bleed easily.  Psychiatric/Behavioral:  Negative for depression, memory loss, substance abuse and  suicidal  ideas. The patient is nervous/anxious. The patient does not have insomnia.       Objective:     There were no vitals taken for this visit.   Physical Exam Vitals reviewed.  Constitutional:      General: She is not in acute distress.    Appearance: Normal appearance. She is well-developed. She is ill-appearing. She is not toxic-appearing or diaphoretic.     Comments: Thin and underweight BMI of 17  HENT:     Head: Normocephalic and atraumatic.     Nose: Nose normal. No nasal deformity, septal deviation, mucosal edema or rhinorrhea.     Right Sinus: No maxillary sinus tenderness or frontal sinus tenderness.     Left Sinus: No maxillary sinus tenderness or frontal sinus tenderness.     Mouth/Throat:     Pharynx: No oropharyngeal exudate.  Eyes:     General: No scleral icterus.    Conjunctiva/sclera: Conjunctivae normal.     Pupils: Pupils are equal, round, and reactive to light.  Neck:     Thyroid: No thyromegaly.     Vascular: No carotid bruit or JVD.     Trachea: Trachea normal. No tracheal tenderness or tracheal deviation.  Cardiovascular:     Rate and Rhythm: Normal rate and regular rhythm.     Chest Wall: PMI is not displaced.     Pulses: Normal pulses. No decreased pulses.     Heart sounds: Normal heart sounds, S1 normal and S2 normal. Heart sounds not distant. No murmur heard.    No systolic murmur is present.     No diastolic murmur is present.     No friction rub. No gallop. No S3 or S4 sounds.  Pulmonary:     Effort: No tachypnea, accessory muscle usage or respiratory distress.     Breath sounds: No stridor. No decreased breath sounds, wheezing, rhonchi or rales.  Chest:     Chest wall: No tenderness.  Abdominal:     General: Bowel sounds are normal. There is distension.     Palpations: Abdomen is soft. Abdomen is not rigid.     Tenderness: There is abdominal tenderness. There is no guarding or rebound.     Comments: Ileostomy bag is secured there is  inflammation Just below the umbilicus there is a vesicular fistula with inflammatory changes  Genitourinary:    Comments: Pressure ulcer at the top above rectum at the area of the coccyx Musculoskeletal:        General: Normal range of motion.     Cervical back: Normal range of motion and neck supple. No edema, erythema or rigidity. No muscular tenderness. Normal range of motion.  Lymphadenopathy:     Head:     Right side of head: No submental or submandibular adenopathy.     Left side of head: No submental or submandibular adenopathy.     Cervical: No cervical adenopathy.  Skin:    General: Skin is warm and dry.     Coloration: Skin is not pale.     Findings: No rash.     Nails: There is no clubbing.  Neurological:     Mental Status: She is alert and oriented to person, place, and time.     Sensory: No sensory deficit.     Gait: Gait abnormal.     Comments: Walker dependent  Psychiatric:        Speech: Speech normal.        Behavior: Behavior normal.  No results found for any visits on 03/24/23.    The ASCVD Risk score (Arnett DK, et al., 2019) failed to calculate for the following reasons:   Cannot find a previous HDL lab   Cannot find a previous total cholesterol lab    Assessment & Plan:   Problem List Items Addressed This Visit   None No follow-ups on file.    Shan Levans, MD

## 2023-03-24 ENCOUNTER — Ambulatory Visit: Payer: Medicaid Other | Admitting: Critical Care Medicine

## 2023-04-06 IMAGING — DX DG ABD PORTABLE 1V
1 series · 1 of 1 positions shown · non-contrast
Comparison: X-ray abdomen 09/06/2021

CLINICAL DATA: Feeding tube placement

EXAM:
PORTABLE ABDOMEN - 1 VIEW

[abdomen]
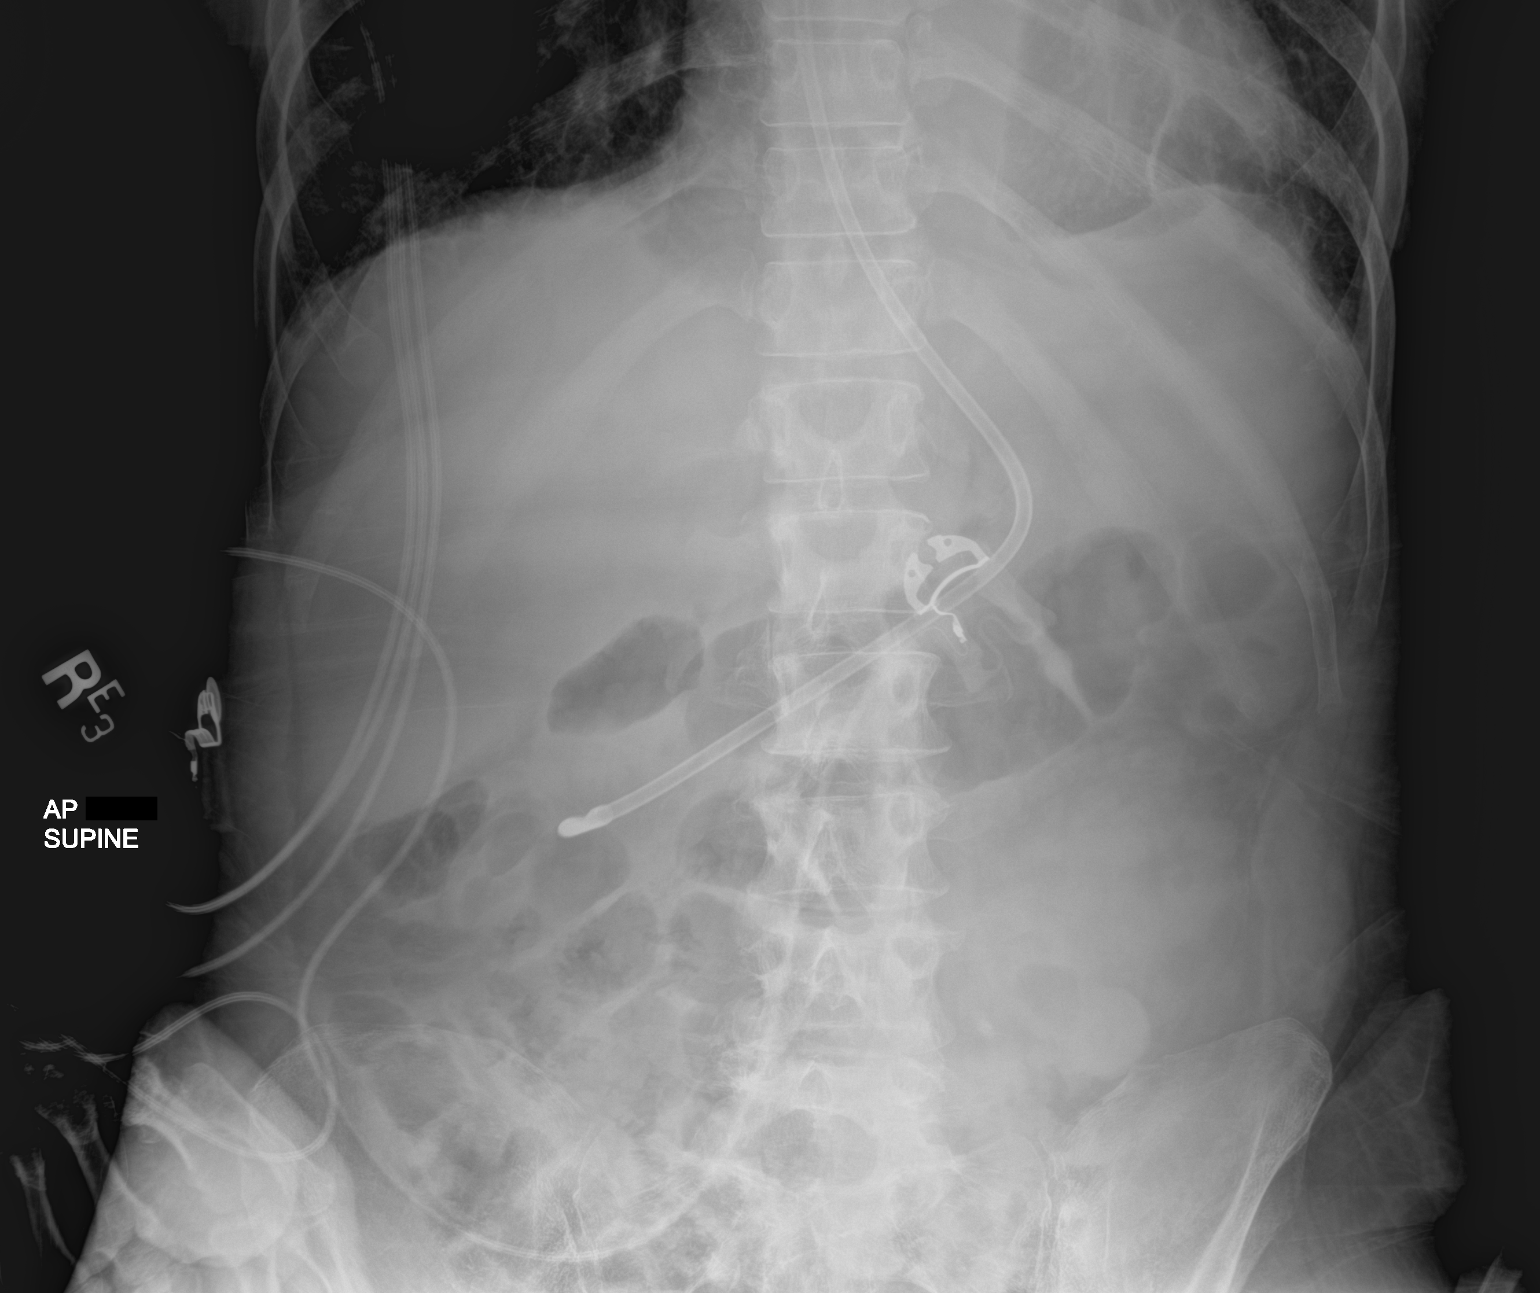

[1 of 1 positions shown; findings below may reference images not displayed]

FINDINGS: Left trace pleural effusion.

Enteric tube with tip overlying the expected region of the gastric
antrum/first portion of the duodenum. The bowel gas pattern is
normal. No radio-opaque calculi or other significant radiographic
abnormality are seen.
IMPRESSION: 1. Enteric tube with tip overlying the expected region of the
gastric antrum/first portion of the duodenum.
2. Left trace pleural effusion.

## 2023-04-30 ENCOUNTER — Telehealth: Payer: Self-pay | Admitting: Critical Care Medicine

## 2023-04-30 NOTE — Telephone Encounter (Signed)
Pt states that she spoke with [facility] and they are needing a prescription for her colostomy bags and depends in order for her to get them. Please advise.   [facility] phone number: [phone]

## 2023-04-30 NOTE — Telephone Encounter (Signed)
Spoke with Barbara Cower at [facility] . Barbara Cower verified the patient has an order on record until May of next year. Barbara Cower did request that last visit not to be faxed to [facility]. Last visit noted faxed to [facility] [phone].

## 2023-06-11 IMAGING — CT CT HEAD W/O CM
4 series · 16 of 47 positions shown, 18 images · non-contrast
Comparison: 10/25/2021

CLINICAL DATA: Mental status changes.  Smoking crack at home.



[Series 3: head bone · axial · 0.50mm/px · z∈[-18,+10]mm · 3 of 70 slices shown]
[im 7/70  bone]
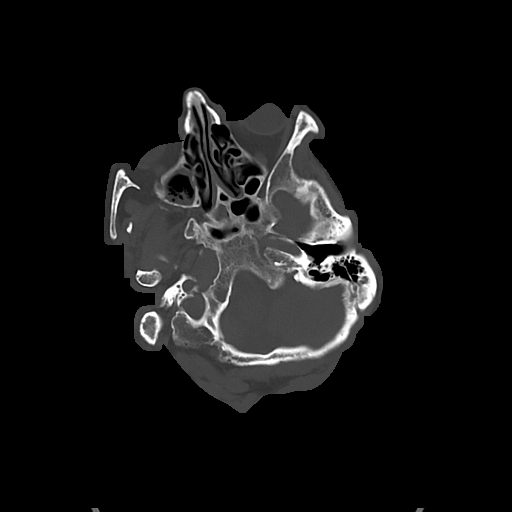
[im 14/70  bone]
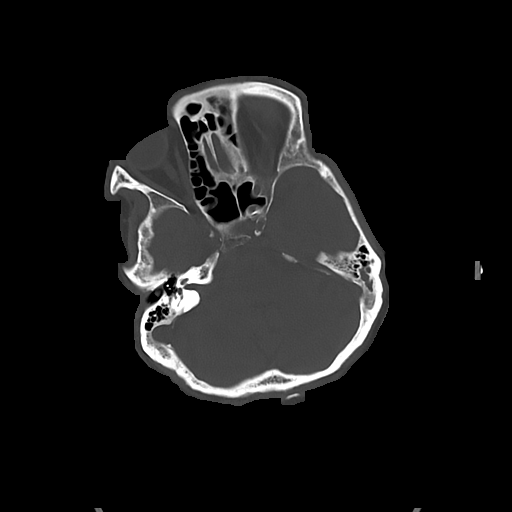
[im 21/70  bone]
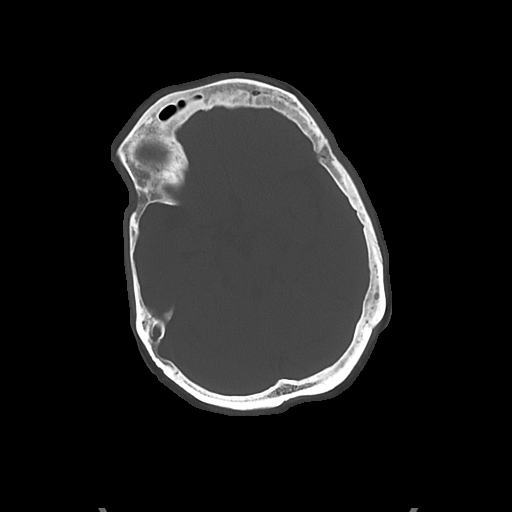

[Series 4: head wo · axial · 0.50mm/px · z∈[-16,+84]mm · 7 of 28 slices shown, 9 images]
[im 4/28  brain]
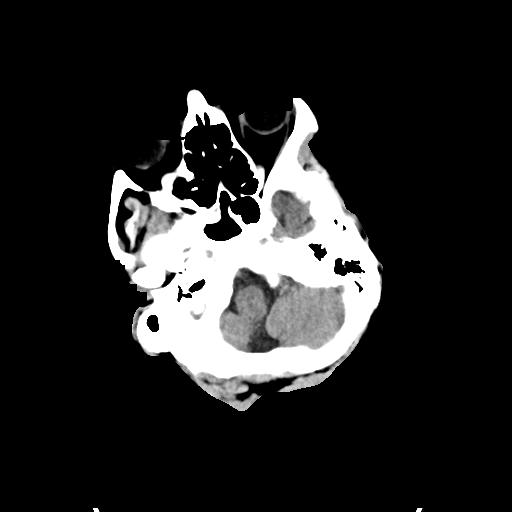
[im 4/28  bone]
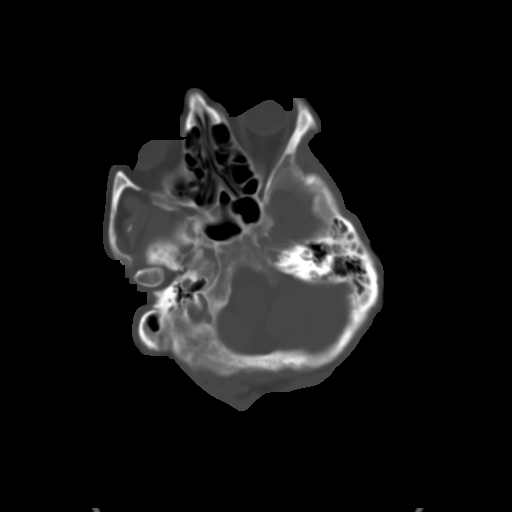
[im 7/28  brain]
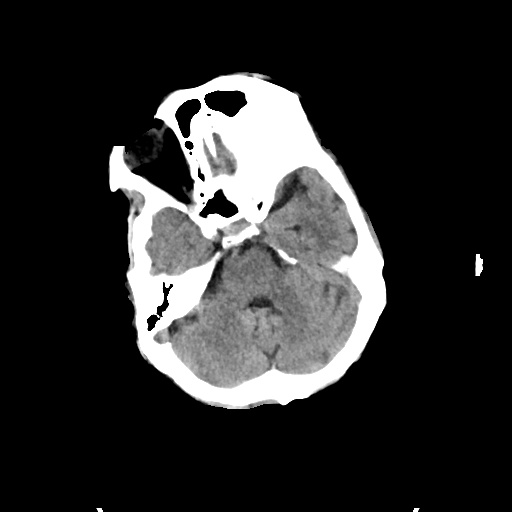
[im 11/28  brain]
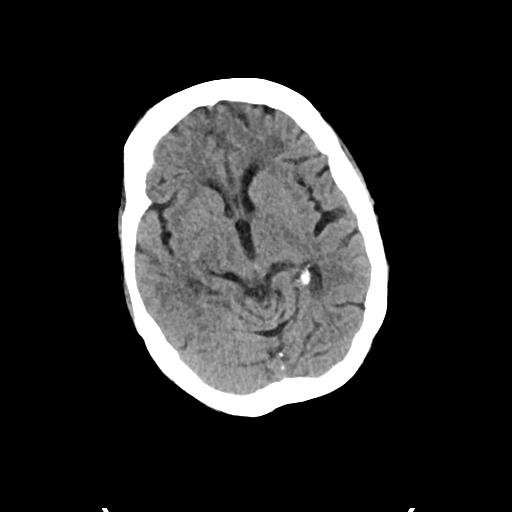
[im 14/28  brain]
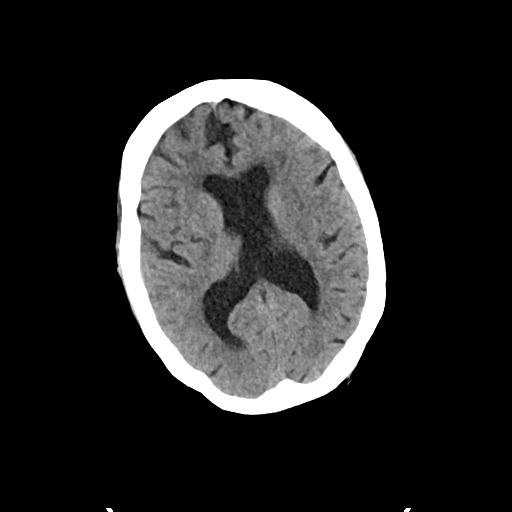
[im 17/28  brain]
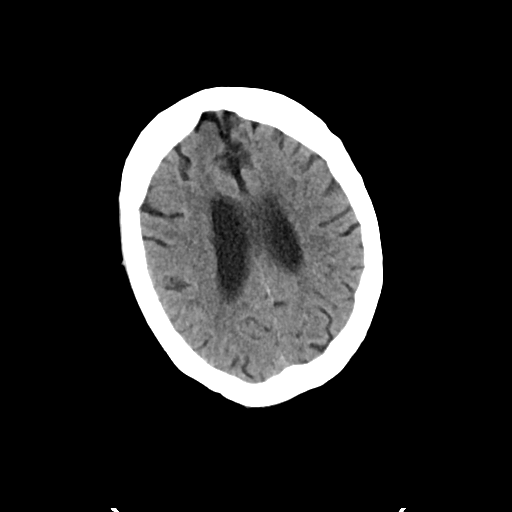
[im 17/28  bone]
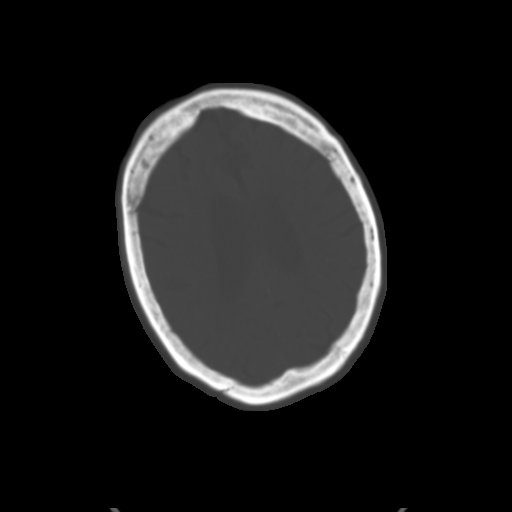
[im 21/28  brain]
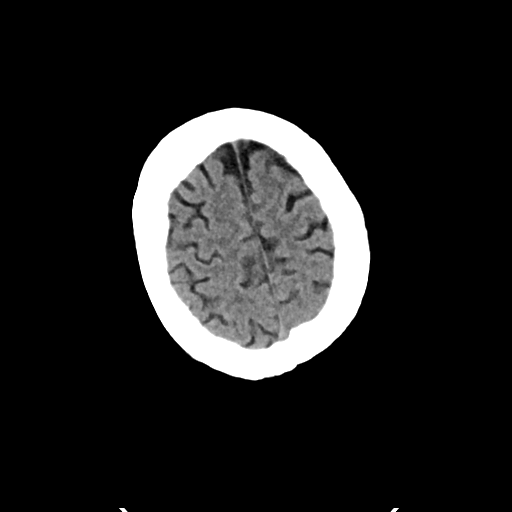
[im 24/28  brain]
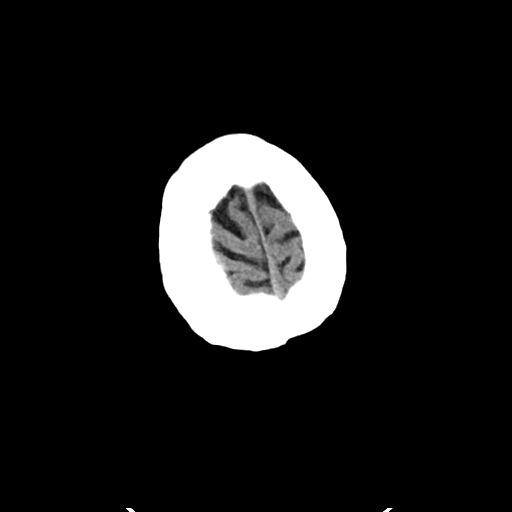

[Series 5: cor soft · coronal · 0.29mm/px · 3 of 62 slices shown]
[im 21/62  brain]
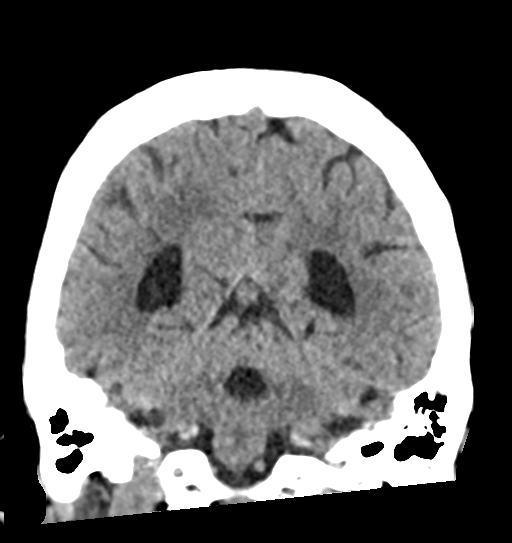
[im 28/62  brain]
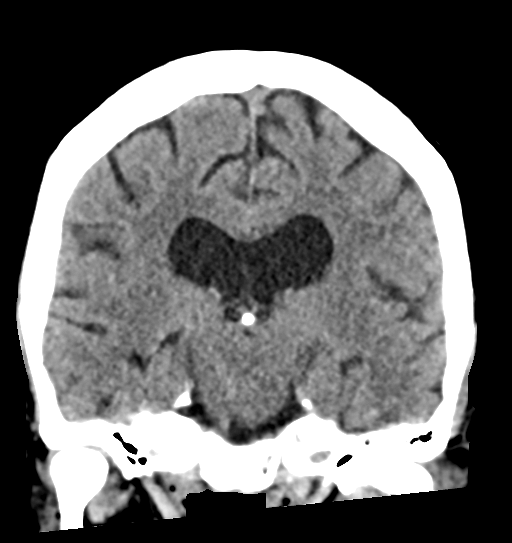
[im 34/62  brain]
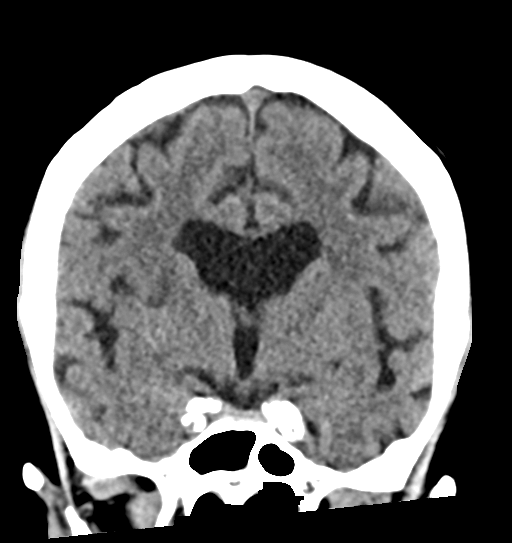

[Series 6: sag soft · sagittal · 0.29mm/px · 3 of 49 slices shown]
[im 17/49  brain]
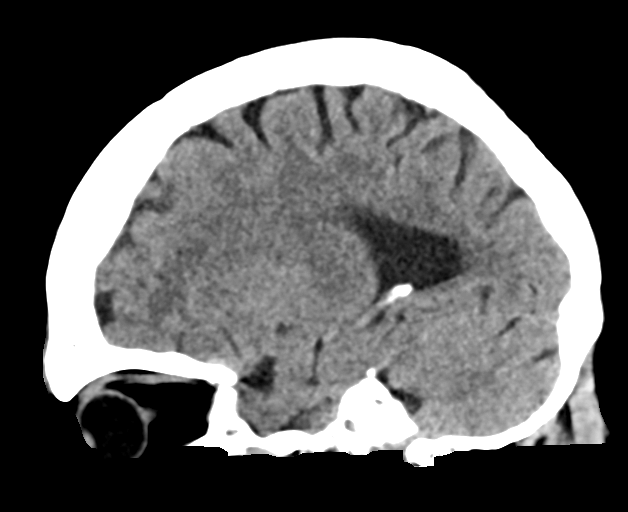
[im 25/49  brain]
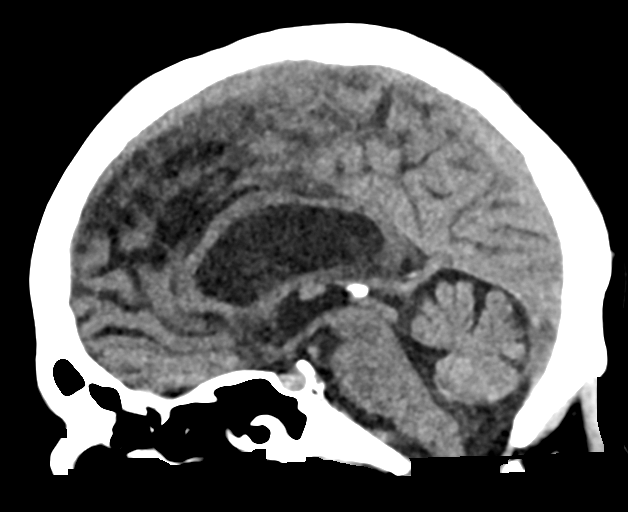
[im 33/49  brain]
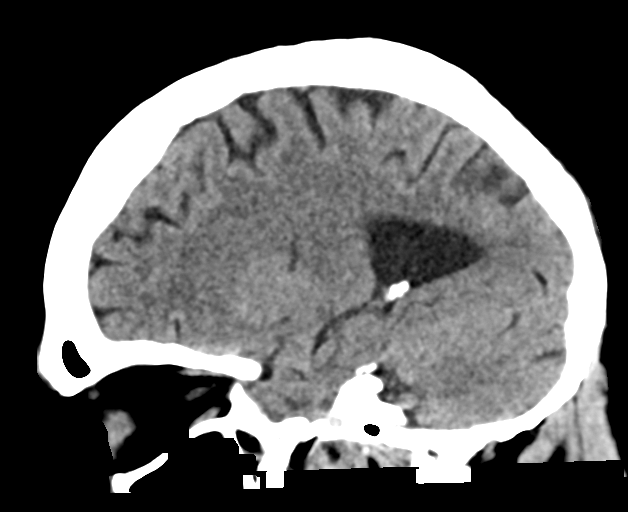

[16 of 47 positions shown; findings below may reference images not displayed]

FINDINGS: Brain: There is central and cortical atrophy. There is no intra or
extra-axial fluid collection or mass lesion. The basilar cisterns
and ventricles have a normal appearance. There is no CT evidence for
acute infarction or hemorrhage.

Vascular: There is dense atherosclerotic calcification of the
internal carotid arteries. No hyperdense vessels.

Skull: Normal. Negative for fracture or focal lesion.

Sinuses/Orbits: LEFT mastoid effusion, stable. Minimal opacification
of the paranasal sinuses.

Other: None.
IMPRESSION: 1.  No evidence for acute intracranial abnormality.
2. Central and cortical atrophy.
3. LEFT mastoid effusion.

## 2023-06-11 IMAGING — CR DG CHEST 1V
2 series · 2 of 2 positions shown · non-contrast
Comparison: November 03, 2021

CLINICAL DATA: Altered mental status.

EXAM:
CHEST  1 VIEW

[chest ap (1 of 2)]
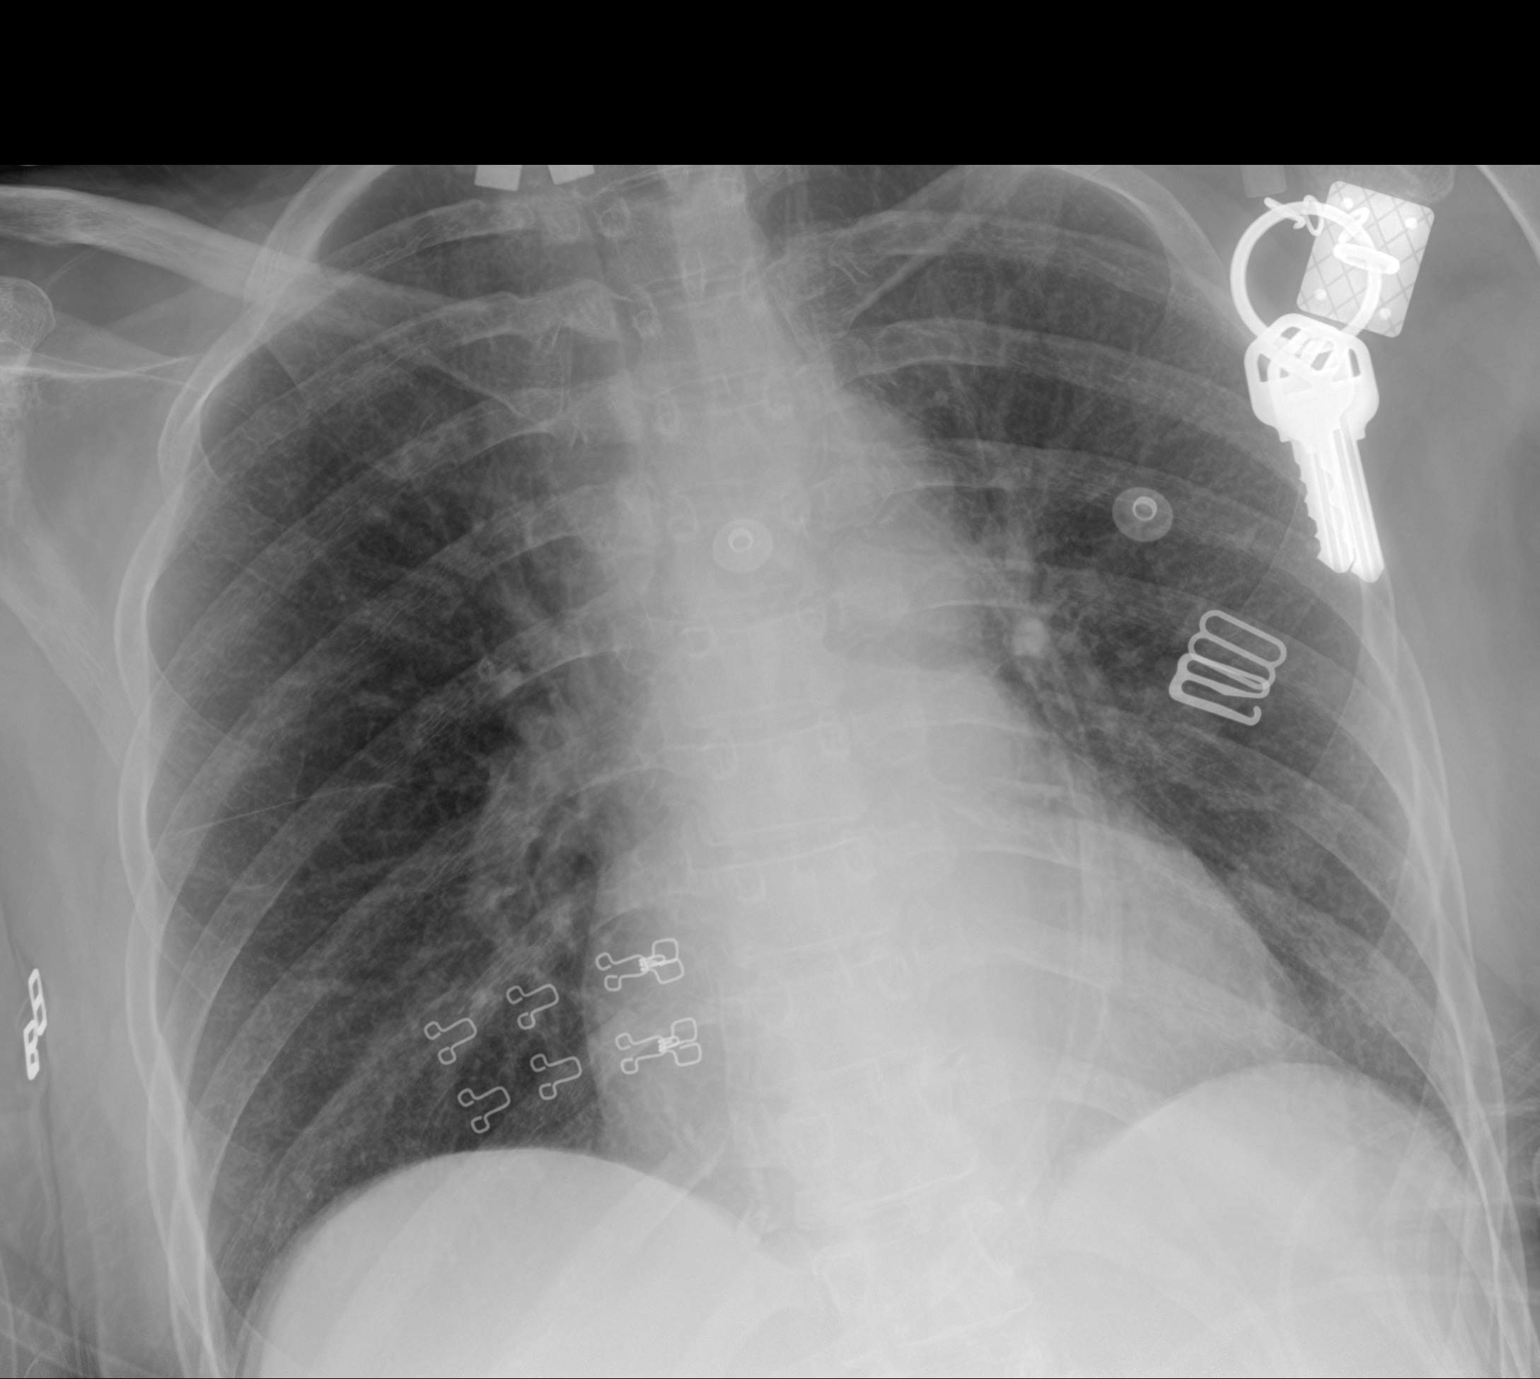

[chest ap (2 of 2)]
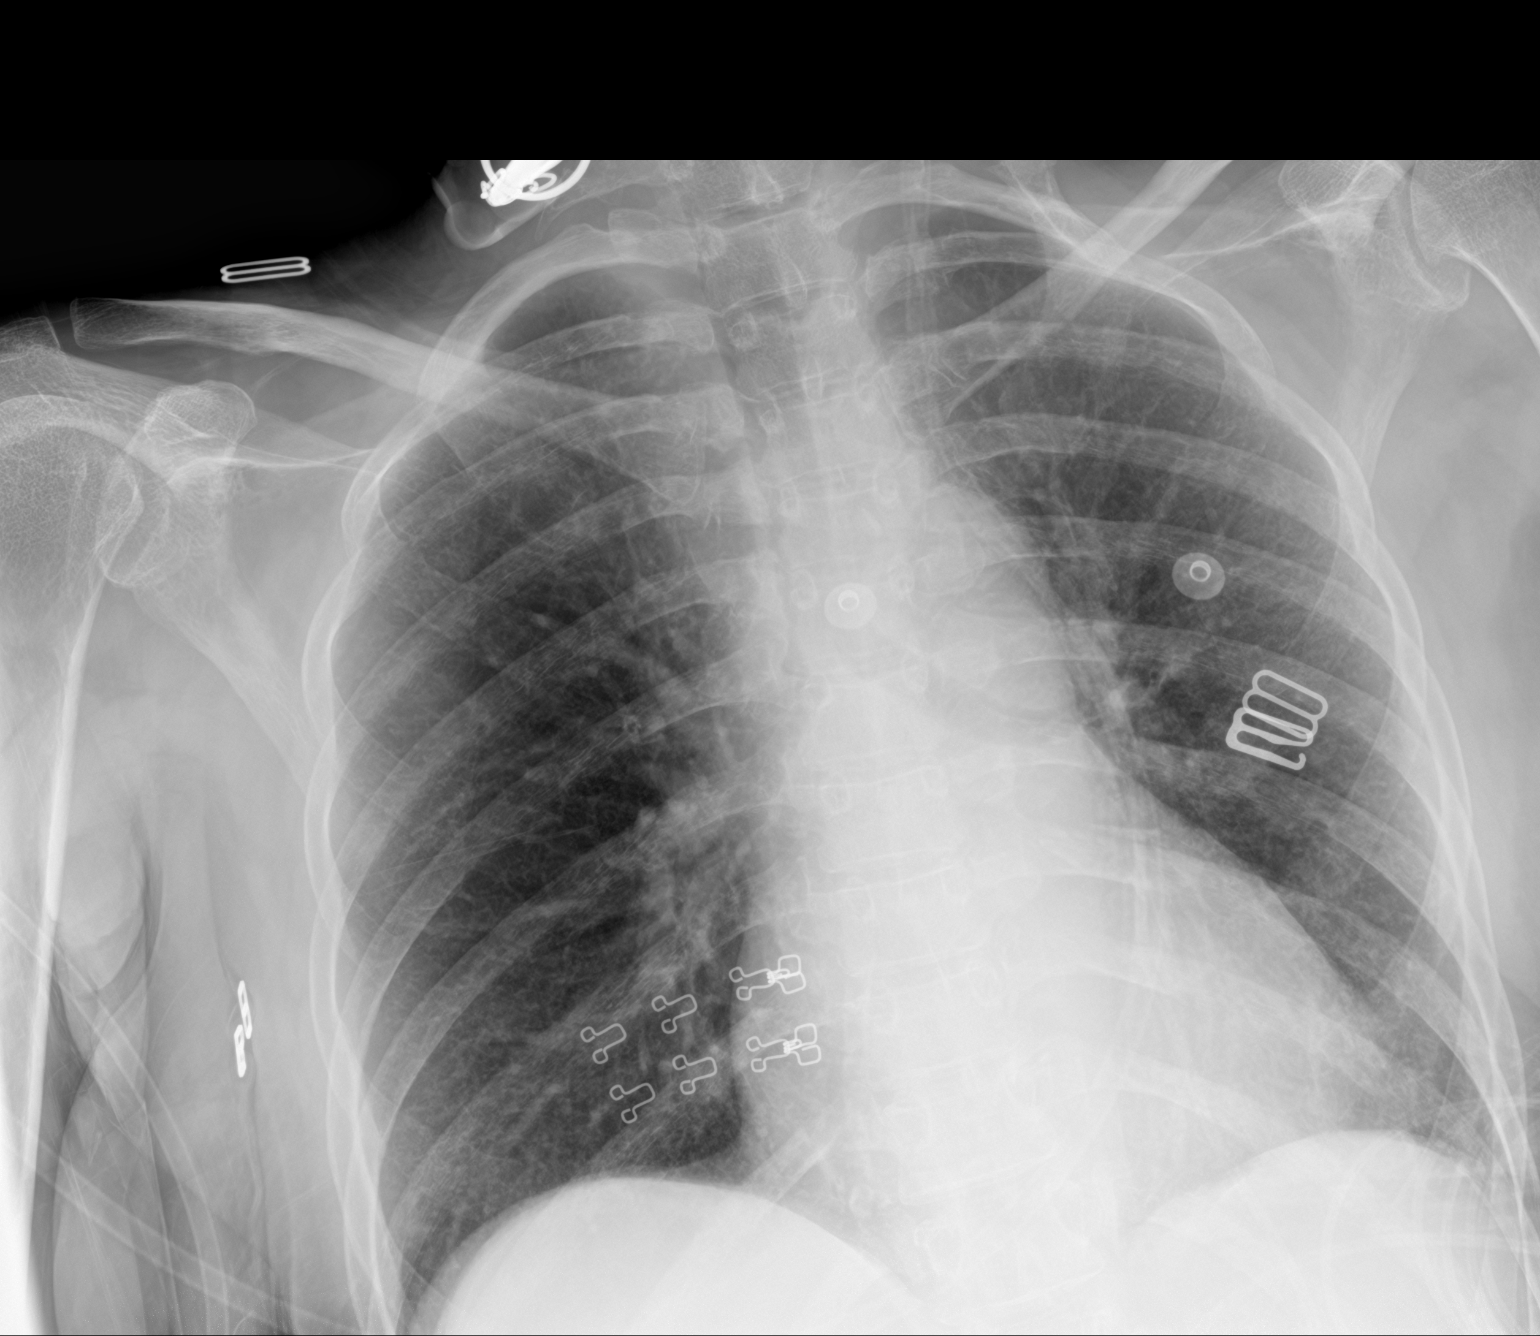

[2 of 2 positions shown; findings below may reference images not displayed]

FINDINGS: The heart size and mediastinal contours are within normal limits.
Mild increased pulmonary interstitium is identified bilaterally. The
visualized skeletal structures are unremarkable.
IMPRESSION: Mild increased pulmonary interstitium is identified bilaterally.
Mild pulmonary edema versus viral infection.

## 2023-06-27 ENCOUNTER — Other Ambulatory Visit: Payer: Self-pay

## 2023-06-27 ENCOUNTER — Encounter (HOSPITAL_COMMUNITY): Payer: Self-pay | Admitting: Emergency Medicine

## 2023-06-27 ENCOUNTER — Emergency Department (HOSPITAL_COMMUNITY)
Admission: EM | Admit: 2023-06-27 | Discharge: 2023-06-27 | Disposition: A | Discharge status: Home / Self Care | Payer: MEDICAID | Attending: Emergency Medicine | Admitting: Emergency Medicine

## 2023-06-27 DIAGNOSIS — N189 Chronic kidney disease, unspecified: Secondary | ICD-10-CM | POA: Diagnosis not present

## 2023-06-27 DIAGNOSIS — I509 Heart failure, unspecified: Secondary | ICD-10-CM | POA: Diagnosis not present

## 2023-06-27 DIAGNOSIS — J449 Chronic obstructive pulmonary disease, unspecified: Secondary | ICD-10-CM | POA: Diagnosis not present

## 2023-06-27 DIAGNOSIS — F10129 Alcohol abuse with intoxication, unspecified: Secondary | ICD-10-CM | POA: Insufficient documentation

## 2023-06-27 DIAGNOSIS — Z59819 Housing instability, housed unspecified: Secondary | ICD-10-CM

## 2023-06-27 DIAGNOSIS — Z85048 Personal history of other malignant neoplasm of rectum, rectosigmoid junction, and anus: Secondary | ICD-10-CM | POA: Diagnosis not present

## 2023-06-27 DIAGNOSIS — M79606 Pain in leg, unspecified: Secondary | ICD-10-CM | POA: Diagnosis not present

## 2023-06-27 DIAGNOSIS — Z765 Malingerer [conscious simulation]: Secondary | ICD-10-CM

## 2023-06-27 NOTE — ED Notes (Signed)
Cleaned pt with soap/water, put gown and changed linen and brief, placed wet clothes in belongings bag

## 2023-06-27 NOTE — ED Provider Notes (Signed)
[place] EMERGENCY DEPARTMENT AT [facility] Provider Note   CSN: [id number] Arrival date & time: 06/27/23  0340     History  Chief Complaint  Patient presents with   Alcohol Intoxication   Leg Pain    Sherry Baker is a 57 y.o. female with colostomy following history of rectal cancer who presents today intoxicated with alcohol.  States she just recently got out of jail and does not have a place to stay.  Patient is tearful, clearly intoxicated, unable to quantify how much alcohol she had.  Patient does not complain of any acute physical concerns to this provider at this time.  States primarily that she did not have a place to stay.  Also requesting something to eat.  I have reviewed her medical records.  History of CKD, GERD, ileostomy as above, homelessness, alcohol abuse, COPD, CHF.  HPI     Home Medications Prior to Admission medications   Medication Sig Start Date End Date Taking? Authorizing Provider  albuterol (VENTOLIN HFA) 108 (90 Base) MCG/ACT inhaler Inhale 2 puffs into the lungs every 6 (six) hours as needed for wheezing or shortness of breath. 01/08/23   Storm Frisk, MD  amLODipine (NORVASC) 10 MG tablet Take 1 tablet (10 mg total) by mouth daily. 02/10/23   Storm Frisk, MD  carvedilol (COREG) 25 MG tablet Take 1 tablet (25 mg total) by mouth 2 (two) times daily with a meal. 02/10/23   Storm Frisk, MD  feeding supplement (ENSURE ENLIVE / ENSURE PLUS) LIQD Take 237 mLs by mouth 3 (three) times daily between meals. 01/08/23   Storm Frisk, MD  QUEtiapine (SEROQUEL) 50 MG tablet Place 1 tablet (50 mg total) into feeding tube 2 (two) times daily. 02/10/23   Storm Frisk, MD  sertraline (ZOLOFT) 50 MG tablet Take 1 tablet (50 mg total) by mouth daily. 02/10/23   Storm Frisk, MD  sodium bicarbonate 650 MG tablet Take 1 tablet (650 mg total) by mouth daily. 02/10/23   Storm Frisk, MD  umeclidinium-vilanterol (ANORO ELLIPTA) 62.5-25  MCG/ACT AEPB Inhale 1 puff into the lungs daily. 02/10/23   Storm Frisk, MD  valsartan (DIOVAN) 160 MG tablet Take 1 tablet (160 mg total) by mouth daily. 02/10/23   Storm Frisk, MD  ziprasidone (GEODON) 40 MG capsule Take 1 capsule (40 mg total) by mouth 2 (two) times daily with a meal. 11/06/11 01/09/12  Mancel Bale, MD      Allergies    Penicillins    Review of Systems   Review of Systems  Unable to perform ROS: Other (ETOH, difficulty with speech secondary to poor/lack of dentition)    Physical Exam Updated Vital Signs BP 99/69   Pulse 77   Temp (!) 97.5 F (36.4 C) (Oral)   Resp 15   SpO2 99%  Physical Exam Vitals and nursing note reviewed.  Constitutional:      Appearance: She is not toxic-appearing.     Comments: Chronically ill-appearing  HENT:     Head: Normocephalic and atraumatic.     Mouth/Throat:     Mouth: Mucous membranes are moist.     Pharynx: No oropharyngeal exudate or posterior oropharyngeal erythema.  Eyes:     General:        Right eye: No discharge.        Left eye: No discharge.     Conjunctiva/sclera: Conjunctivae normal.  Cardiovascular:     Rate and Rhythm:  Normal rate and regular rhythm.     Pulses: Normal pulses.     Heart sounds: Normal heart sounds. No murmur heard. Pulmonary:     Effort: Pulmonary effort is normal. No respiratory distress.     Breath sounds: Normal breath sounds. No wheezing or rales.  Abdominal:     General: Bowel sounds are normal. There is no distension.     Palpations: Abdomen is soft.     Tenderness: There is no abdominal tenderness. There is no right CVA tenderness, left CVA tenderness, guarding or rebound.    Musculoskeletal:        General: No deformity.     Cervical back: Neck supple.  Skin:    General: Skin is warm and dry.     Capillary Refill: Capillary refill takes less than 2 seconds.  Neurological:     Mental Status: She is alert.     Comments: Moving all extremities spontaneously and  without difficulty.   Psychiatric:        Mood and Affect: Mood normal. Affect is tearful.        Speech: Speech is delayed.     Comments: Difficulty      ED Results / Procedures / Treatments   Labs (all labs ordered are listed, but only abnormal results are displayed) Labs Reviewed  CBC WITH DIFFERENTIAL/PLATELET  COMPREHENSIVE METABOLIC PANEL  ETHANOL  URINALYSIS, ROUTINE W REFLEX MICROSCOPIC    EKG None  Radiology No results found.  Procedures Procedures    Medications Ordered in ED Medications - No data to display  ED Course/ Medical Decision Making/ A&P                               Medical Decision Making 42 -year-old female who is intoxicated with alcohol, history of homelessness, recently released from incarceration.  Vitals reassuring on intake.  Patient tearful affect but moving all extremity spontaneously without difficulty.  Ileostomy bag and skin are well-appearing, abdominal exam is benign.  Patient appears intoxicated, difficulty with speaking secondary to intoxication and poor dentition.   Suspect primarily malingering as etiology for patient's presentation, however given complicated history and poor social situation make this patient high risk, I do feel it is reasonable to check basic lab studies and allow patient to metabolize in the emergency department.  Care of this patient signed out to oncoming ED provider M. Gowens, PA-C at time of shift change. All pertinent HPI, physical exam, and laboratory findings were discussed with them prior to my departure. Disposition of patient pending completion of workup, reevaluation, and clinical judgement of oncoming ED provider.   This chart was dictated using voice recognition software, Dragon. Despite the best efforts of this provider to proofread and correct errors, errors may still occur which can change documentation meaning.   Final Clinical Impression(s) / ED Diagnoses Final diagnoses:  None    Rx /  DC Orders ED Discharge Orders     None         Sherrilee Gilles 06/27/23 0618    Glynn Octave, MD 06/27/23 [phone]

## 2023-06-27 NOTE — ED Notes (Signed)
Gave pt [place] sandwich, cola
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
## 2023-06-27 NOTE — ED Triage Notes (Signed)
BIBA  Per EMS: Pt reports pt got out of jail today and is c/o bilateral leg pain & rectal pain. Pt reports colon cancer and rectal cancer. ETOH on board.
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
## 2023-06-27 NOTE — Discharge Instructions (Signed)
Please see attached resources for shelters, financial assistance, etc.  Follow-up with your PCP for any continued concerns.  For new or worsening, emergent symptoms return to the ED for reevaluation.

## 2023-06-30 ENCOUNTER — Encounter (HOSPITAL_COMMUNITY): Payer: Self-pay

## 2023-06-30 ENCOUNTER — Emergency Department (HOSPITAL_COMMUNITY): Payer: MEDICAID

## 2023-06-30 ENCOUNTER — Other Ambulatory Visit: Payer: Self-pay

## 2023-06-30 ENCOUNTER — Emergency Department (HOSPITAL_COMMUNITY)
Admission: EM | Admit: 2023-06-30 | Discharge: 2023-07-01 | Disposition: A | Discharge status: Home / Self Care | Payer: MEDICAID | Attending: Emergency Medicine | Admitting: Emergency Medicine

## 2023-06-30 DIAGNOSIS — R1084 Generalized abdominal pain: Secondary | ICD-10-CM | POA: Diagnosis not present

## 2023-06-30 DIAGNOSIS — N3001 Acute cystitis with hematuria: Secondary | ICD-10-CM

## 2023-06-30 DIAGNOSIS — R7309 Other abnormal glucose: Secondary | ICD-10-CM | POA: Insufficient documentation

## 2023-06-30 DIAGNOSIS — R109 Unspecified abdominal pain: Secondary | ICD-10-CM | POA: Diagnosis present

## 2023-06-30 LAB — I-STAT VENOUS BLOOD GAS, ED
Acid-base deficit: 6 mmol/L — ABNORMAL HIGH (ref 0.0–2.0)
Bicarbonate: 18.5 mmol/L — ABNORMAL LOW (ref 20.0–28.0)
Calcium, Ion: 1.22 mmol/L (ref 1.15–1.40)
HCT: 32 % — ABNORMAL LOW (ref 36.0–46.0)
Hemoglobin: 10.9 g/dL — ABNORMAL LOW (ref 12.0–15.0)
O2 Saturation: 97 %
Potassium: 4.1 mmol/L (ref 3.5–5.1)
Sodium: 139 mmol/L (ref 135–145)
TCO2: 20 mmol/L — ABNORMAL LOW (ref 22–32)
pCO2, Ven: 32.4 mmHg — ABNORMAL LOW (ref 44–60)
pH, Ven: 7.365 (ref 7.25–7.43)
pO2, Ven: 93 mmHg — ABNORMAL HIGH (ref 32–45)

## 2023-06-30 LAB — LACTIC ACID, PLASMA
Lactic Acid, Venous: 0.8 mmol/L (ref 0.5–1.9)
Lactic Acid, Venous: 0.8 mmol/L (ref 0.5–1.9)

## 2023-06-30 LAB — CBC WITH DIFFERENTIAL/PLATELET
Abs Immature Granulocytes: 0.02 10*3/uL (ref 0.00–0.07)
Basophils Absolute: 0 10*3/uL (ref 0.0–0.1)
Basophils Relative: 0 %
Eosinophils Absolute: 0 10*3/uL (ref 0.0–0.5)
Eosinophils Relative: 1 %
HCT: 35.7 % — ABNORMAL LOW (ref 36.0–46.0)
Hemoglobin: 11.3 g/dL — ABNORMAL LOW (ref 12.0–15.0)
Immature Granulocytes: 0 %
Lymphocytes Relative: 19 %
Lymphs Abs: 1.3 10*3/uL (ref 0.7–4.0)
MCH: 27.4 pg (ref 26.0–34.0)
MCHC: 31.7 g/dL (ref 30.0–36.0)
MCV: 86.7 fL (ref 80.0–100.0)
Monocytes Absolute: 0.8 10*3/uL (ref 0.1–1.0)
Monocytes Relative: 11 %
Neutro Abs: 4.7 10*3/uL (ref 1.7–7.7)
Neutrophils Relative %: 69 %
Platelets: 245 10*3/uL (ref 150–400)
RBC: 4.12 MIL/uL (ref 3.87–5.11)
RDW: 15.3 % (ref 11.5–15.5)
WBC: 6.8 10*3/uL (ref 4.0–10.5)
nRBC: 0 % (ref 0.0–0.2)

## 2023-06-30 LAB — COMPREHENSIVE METABOLIC PANEL
ALT: 28 U/L (ref 0–44)
AST: 27 U/L (ref 15–41)
Albumin: 3.4 g/dL — ABNORMAL LOW (ref 3.5–5.0)
Alkaline Phosphatase: 118 U/L (ref 38–126)
Anion gap: 15 (ref 5–15)
BUN: 39 mg/dL — ABNORMAL HIGH (ref 6–20)
CO2: 13 mmol/L — ABNORMAL LOW (ref 22–32)
Calcium: 8.4 mg/dL — ABNORMAL LOW (ref 8.9–10.3)
Chloride: 102 mmol/L (ref 98–111)
Creatinine, Ser: 1.57 mg/dL — ABNORMAL HIGH (ref 0.44–1.00)
GFR, Estimated: 39 mL/min — ABNORMAL LOW (ref >60)
Glucose, Bld: 67 mg/dL — ABNORMAL LOW (ref 70–99)
Potassium: 4.2 mmol/L (ref 3.5–5.1)
Sodium: 130 mmol/L — ABNORMAL LOW (ref 135–145)
Total Bilirubin: 0.6 mg/dL (ref 0.3–1.2)
Total Protein: 8 g/dL (ref 6.5–8.1)

## 2023-06-30 LAB — LIPASE, BLOOD: Lipase: 30 U/L (ref 11–51)

## 2023-06-30 LAB — CBG MONITORING, ED: Glucose-Capillary: 84 mg/dL (ref 70–99)

## 2023-06-30 MED ORDER — IOHEXOL 350 MG/ML SOLN
75.0000 mL | Freq: Once | INTRAVENOUS | Status: AC | PRN
  Administered 2023-06-30: 75 mL via INTRAVENOUS

## 2023-06-30 MED ORDER — LACTATED RINGERS IV BOLUS
1000.0000 mL | Freq: Once | INTRAVENOUS | Status: AC
  Administered 2023-06-30: 1000 mL via INTRAVENOUS

## 2023-06-30 MED ORDER — MORPHINE SULFATE (PF) 4 MG/ML IV SOLN
4.0000 mg | Freq: Once | INTRAVENOUS | Status: AC
  Administered 2023-07-01: 4 mg via INTRAVENOUS
  Filled 2023-06-30: qty 1

## 2023-06-30 MED ORDER — LACTATED RINGERS IV BOLUS
1000.0000 mL | Freq: Once | INTRAVENOUS | Status: AC
  Administered 2023-07-01: 1000 mL via INTRAVENOUS

## 2023-06-30 NOTE — ED Provider Triage Note (Signed)
Emergency Medicine Provider Triage Evaluation Note  Sherry Baker , a 57 y.o. female  was evaluated in triage.  Pt complains of abdominal pain.  Patient reports that she has not her ostomy bag replaced in about a week or so.  States that the bag reversed about a week ago abdomen ostomy site covered.  Concerned that there has been some pus draining and significant pain and redness from the ostomy site.   Review of Systems  Positive: As above Negative: As above  Physical Exam  BP (!) 145/95   Pulse (!) 106   Temp 98 F (36.7 C) (Oral)   Resp 18   SpO2 96%  Gen:   Awake, no distress   Resp:  Normal effort  MSK:   Moves extremities without difficulty  Other:  Erythematous ostomy site but no obvious drainage appreciated.  Bowel contents present.  Medical Decision Making  Medically screening exam initiated at 2:53 PM.  Appropriate orders placed.  Sherry Baker was informed that the remainder of the evaluation will be completed by another provider, this initial triage assessment does not replace that evaluation, and the importance of remaining in the ED until their evaluation is complete.     Sherry Knudsen, PA-C 06/30/23 1454

## 2023-06-30 NOTE — ED Notes (Signed)
Pt provided with new ostomy bag in triage.

## 2023-06-30 NOTE — ED Triage Notes (Signed)
Pt bib ems, pt homeless, c.o pain at ostomy site. Pt has not had an ostomy bag on since Friday. Stoma is red and inflamed.

## 2023-06-30 NOTE — ED Provider Notes (Signed)
[place] EMERGENCY DEPARTMENT AT [facility] Provider Note   CSN: [id number] Arrival date & time: 06/30/23  1442     History {Add pertinent medical, surgical, social history, OB history to HPI:1} Chief Complaint  Patient presents with   Abdominal Pain    Sherry Baker is a 57 y.o. female.   Abdominal Pain 56 year old female history of colon cancer status post colectomy with ileostomy presenting for abdominal pain.  She states over the last few days she lost access to her housing.  She reports she was unable to get a new colostomy bag.  She has had trouble getting to the hospital recently.  She has severe pain around the colostomy site as well as abdominal pain.  Reports increased output, no vomiting.  No dysuria or hematuria.  No fevers or chills or chest pain or shortness of breathing.  Per chart review she was seen in the ED August 24 and thought to be malingering and was discharged.     Home Medications Prior to Admission medications   Medication Sig Start Date End Date Taking? Authorizing Provider  albuterol (VENTOLIN HFA) 108 (90 Base) MCG/ACT inhaler Inhale 2 puffs into the lungs every 6 (six) hours as needed for wheezing or shortness of breath. 01/08/23   Storm Frisk, MD  amLODipine (NORVASC) 10 MG tablet Take 1 tablet (10 mg total) by mouth daily. 02/10/23   Storm Frisk, MD  carvedilol (COREG) 25 MG tablet Take 1 tablet (25 mg total) by mouth 2 (two) times daily with a meal. 02/10/23   Storm Frisk, MD  feeding supplement (ENSURE ENLIVE / ENSURE PLUS) LIQD Take 237 mLs by mouth 3 (three) times daily between meals. 01/08/23   Storm Frisk, MD  QUEtiapine (SEROQUEL) 50 MG tablet Place 1 tablet (50 mg total) into feeding tube 2 (two) times daily. 02/10/23   Storm Frisk, MD  sertraline (ZOLOFT) 50 MG tablet Take 1 tablet (50 mg total) by mouth daily. 02/10/23   Storm Frisk, MD  sodium bicarbonate 650 MG tablet Take 1 tablet (650 mg total) by  mouth daily. 02/10/23   Storm Frisk, MD  umeclidinium-vilanterol (ANORO ELLIPTA) 62.5-25 MCG/ACT AEPB Inhale 1 puff into the lungs daily. 02/10/23   Storm Frisk, MD  valsartan (DIOVAN) 160 MG tablet Take 1 tablet (160 mg total) by mouth daily. 02/10/23   Storm Frisk, MD  ziprasidone (GEODON) 40 MG capsule Take 1 capsule (40 mg total) by mouth 2 (two) times daily with a meal. 11/06/11 01/09/12  Mancel Bale, MD      Allergies    Penicillins    Review of Systems   Review of Systems  Gastrointestinal:  Positive for abdominal pain.    Physical Exam Updated Vital Signs BP (!) 193/112 (BP Location: Left Arm)   Pulse (!) 103   Temp 98.1 F (36.7 C)   Resp 16   SpO2 94%  Physical Exam  ED Results / Procedures / Treatments   Labs (all labs ordered are listed, but only abnormal results are displayed) Labs Reviewed  CBC WITH DIFFERENTIAL/PLATELET - Abnormal; Notable for the following components:      Result Value   Hemoglobin 11.3 (*)    HCT 35.7 (*)    All other components within normal limits  COMPREHENSIVE METABOLIC PANEL - Abnormal; Notable for the following components:   Sodium 130 (*)    CO2 13 (*)    Glucose, Bld 67 (*)  BUN 39 (*)    Creatinine, Ser 1.57 (*)    Calcium 8.4 (*)    Albumin 3.4 (*)    GFR, Estimated 39 (*)    All other components within normal limits  LIPASE, BLOOD  URINALYSIS, ROUTINE W REFLEX MICROSCOPIC    EKG None  Radiology No results found.  Procedures Procedures  {Document cardiac monitor, telemetry assessment procedure when appropriate:1}  Medications Ordered in ED Medications - No data to display  ED Course/ Medical Decision Making/ A&P   {   Click here for ABCD2, HEART and other calculatorsREFRESH Note before signing :1}                              Medical Decision Making  ***  {Document critical care time when appropriate:1} {Document review of labs and clinical decision tools ie heart score, Chads2Vasc2 etc:1}   {Document your independent review of radiology images, and any outside records:1} {Document your discussion with family members, caretakers, and with consultants:1} {Document social determinants of health affecting pt's care:1} {Document your decision making why or why not admission, treatments were needed:1} Final Clinical Impression(s) / ED Diagnoses Final diagnoses:  None    Rx / DC Orders ED Discharge Orders     None

## 2023-06-30 NOTE — ED Provider Notes (Signed)
Plan at signout - f/u on CT imaging,urinalysis and recheck electrolytes after 2nd LR bolus   Zadie Rhine, MD 06/30/23 2352

## 2023-07-01 LAB — URINALYSIS, ROUTINE W REFLEX MICROSCOPIC
Bilirubin Urine: NEGATIVE
Glucose, UA: NEGATIVE mg/dL
Ketones, ur: NEGATIVE mg/dL
Nitrite: NEGATIVE
Protein, ur: NEGATIVE mg/dL
Specific Gravity, Urine: 1.009 (ref 1.005–1.030)
WBC, UA: >50 WBC/hpf (ref 0–5)
pH: 6 (ref 5.0–8.0)

## 2023-07-01 LAB — BASIC METABOLIC PANEL
Anion gap: 10 (ref 5–15)
BUN: 31 mg/dL — ABNORMAL HIGH (ref 6–20)
CO2: 22 mmol/L (ref 22–32)
Calcium: 8.8 mg/dL — ABNORMAL LOW (ref 8.9–10.3)
Chloride: 104 mmol/L (ref 98–111)
Creatinine, Ser: 1.39 mg/dL — ABNORMAL HIGH (ref 0.44–1.00)
GFR, Estimated: 45 mL/min — ABNORMAL LOW (ref >60)
Glucose, Bld: 107 mg/dL — ABNORMAL HIGH (ref 70–99)
Potassium: 3.7 mmol/L (ref 3.5–5.1)
Sodium: 136 mmol/L (ref 135–145)

## 2023-07-01 MED ORDER — FOSFOMYCIN TROMETHAMINE 3 G PO PACK
3.0000 g | PACK | Freq: Once | ORAL | Status: AC
  Administered 2023-07-01: 3 g via ORAL
  Filled 2023-07-01: qty 3

## 2023-07-01 MED ORDER — HYDROCODONE-ACETAMINOPHEN 5-325 MG PO TABS
1.0000 | ORAL_TABLET | Freq: Once | ORAL | Status: AC
  Administered 2023-07-01: 1 via ORAL
  Filled 2023-07-01: qty 1

## 2023-07-01 NOTE — ED Provider Notes (Signed)
BP 128/88 (BP Location: Left Arm)   Pulse 93   Temp 98.5 F (36.9 C) (Oral)   Resp 15   SpO2 98%  Overall patient is improved.  No acute distress.  Found to have UTI, given fosfomycin.  With nurse present, we checked her sacral wound it appears to be healing well.  Patient is safe for discharge as her metabolic acidosis is improved.  She is requesting colostomy supplies.  Patient reports she is facing housing insecurity and will contact her Child psychotherapist.  She has been given [facility] for [facility]   Zadie Rhine, MD 07/01/23 [phone]

## 2023-07-01 NOTE — Discharge Instructions (Signed)
[facility]  Address: [address] [place], [place] [address] Hours:    Phone: [phone]

## 2023-07-02 ENCOUNTER — Ambulatory Visit: Payer: MEDICAID | Attending: Critical Care Medicine | Admitting: Critical Care Medicine

## 2023-07-02 ENCOUNTER — Other Ambulatory Visit (HOSPITAL_COMMUNITY): Payer: Self-pay

## 2023-07-02 ENCOUNTER — Encounter: Payer: Self-pay | Admitting: Critical Care Medicine

## 2023-07-02 ENCOUNTER — Telehealth: Payer: Self-pay

## 2023-07-02 VITALS — BP 179/98 | HR 124 | Ht 62.0 in | Wt 105.6 lb

## 2023-07-02 DIAGNOSIS — F1010 Alcohol abuse, uncomplicated: Secondary | ICD-10-CM

## 2023-07-02 DIAGNOSIS — N1832 Chronic kidney disease, stage 3b: Secondary | ICD-10-CM

## 2023-07-02 DIAGNOSIS — C20 Malignant neoplasm of rectum: Secondary | ICD-10-CM | POA: Diagnosis not present

## 2023-07-02 DIAGNOSIS — F3175 Bipolar disorder, in partial remission, most recent episode depressed: Secondary | ICD-10-CM

## 2023-07-02 DIAGNOSIS — Z515 Encounter for palliative care: Secondary | ICD-10-CM | POA: Diagnosis not present

## 2023-07-02 DIAGNOSIS — N3090 Cystitis, unspecified without hematuria: Secondary | ICD-10-CM

## 2023-07-02 DIAGNOSIS — J432 Centrilobular emphysema: Secondary | ICD-10-CM

## 2023-07-02 DIAGNOSIS — E872 Acidosis, unspecified: Secondary | ICD-10-CM

## 2023-07-02 DIAGNOSIS — G893 Neoplasm related pain (acute) (chronic): Secondary | ICD-10-CM

## 2023-07-02 DIAGNOSIS — I10 Essential (primary) hypertension: Secondary | ICD-10-CM

## 2023-07-02 DIAGNOSIS — N322 Vesical fistula, not elsewhere classified: Secondary | ICD-10-CM

## 2023-07-02 MED ORDER — SULFAMETHOXAZOLE-TRIMETHOPRIM 800-160 MG PO TABS
1.0000 | ORAL_TABLET | Freq: Two times a day (BID) | ORAL | 0 refills | Status: DC
  Filled 2023-07-02: qty 20, 10d supply, fill #0

## 2023-07-02 MED ORDER — OXYCODONE HCL 5 MG PO TABS
5.0000 mg | ORAL_TABLET | ORAL | 0 refills | Status: AC | PRN
Start: 2023-07-02 — End: ?
  Filled 2023-07-02: qty 60, 10d supply, fill #0

## 2023-07-02 NOTE — Assessment & Plan Note (Signed)
Chronic pain from rectal cancer abdominal CT in the ER did not show recurrence but she has chronic pain from her sacral decubitus.  I am going to refer her to palliative medicine and start a short course of oxycodone and attempt to get her back into another pain clinic will check drug screen

## 2023-07-02 NOTE — Assessment & Plan Note (Signed)
Check lab

## 2023-07-02 NOTE — Assessment & Plan Note (Signed)
Renew inhalers

## 2023-07-02 NOTE — Assessment & Plan Note (Signed)
Due to ileostomy resume sodium bicarbonate

## 2023-07-02 NOTE — Assessment & Plan Note (Signed)
History of cystitis try to get a urine culture and start Bactrim twice daily 10-day
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
## 2023-07-02 NOTE — Assessment & Plan Note (Signed)
Resume medications check labs

## 2023-07-02 NOTE — Assessment & Plan Note (Signed)
Referral to palliative medicine

## 2023-07-02 NOTE — Patient Instructions (Addendum)
Go to [facility] medical for ostomy bags Depends given Meds refilled Referral to palliative care given Urine for culture Return 2 months Antibiotic given Pain clinic referral

## 2023-07-02 NOTE — Telephone Encounter (Addendum)
I met with the patient and her niece, Paris [place], when they were in the clinic today.  The patient has no phone and she confirmed that she will need to be contacted through [facility]' phone: [phone].  I also confirmed her address. Epic has been updated. The patient is in need of ostomy supplies. I told her that I will call [facility] to confirm that they have the supply order and I will also contact Mike Gip, FNP [facility] to see if she has any supplies that the patient could have while she waits for supply delivery.   I spoke to Nicole/ [facility]  and updated patient's address, phone number and insurance information.  Joni Reining has the orders but stated that they will have to run the insurance to make sure that they are in network. She said they would call me back if any additional information is needed or if they are not in network with [employer].   She said that the last order shipped for the patient was 05/05/2023.   I spoke to Kristy/ [facility] and I contacted Mike Gip, FNP and they confirmed that supplies will be left for the patient at [facility] main entrance desk tomorrow morning for the patient to pick up.  I spoke to [place] and informed her. The patient was with her and they said they understood.  Paris also said that the patient has 1 additional ostomy bag to use before tomorrow if needed.  Wilkie Aye said that she would also call the patient to inform her of the plan for leaving some supplies for her.

## 2023-07-02 NOTE — Assessment & Plan Note (Signed)
Claims to be sober will monitor

## 2023-07-02 NOTE — Assessment & Plan Note (Signed)
Resume sertraline and Seroquel

## 2023-07-04 LAB — URINE CULTURE

## 2023-07-06 NOTE — Progress Notes (Signed)
Let pt know urine is clear no infection

## 2023-07-07 ENCOUNTER — Telehealth: Payer: Self-pay

## 2023-07-07 NOTE — Telephone Encounter (Signed)
-----   Message from Shan Levans sent at 07/06/2023  2:26 PM EDT ----- Let pt know urine is clear no infection

## 2023-07-07 NOTE — Telephone Encounter (Signed)
Tell pt it is from bactrim  she should stop the bactrim antibiotic

## 2023-07-07 NOTE — Telephone Encounter (Signed)
Pt was called and is aware of results, DOB was confirmed.  ?

## 2023-07-08 NOTE — Telephone Encounter (Signed)
I spoke to patient's niece and she confirmed that they picked up the ostomy supplies at the hospital yesterday and she was given 10 ostomy bags.    She also noted that they did not hear from [facility] yet.   I told her that I will call [facility] tomorrow to check on the status of the order.

## 2023-07-09 NOTE — Telephone Encounter (Signed)
Called patient and niece( on dpr) and they are aware. Also the niece stated that the patient is in pain and medication is not working, she stated that the patient has stopped pain medication and started using goodie powder niece does not agree with this. I did not see a referral for pain clinic  the niece was asking about   Patient also updated [place] that her ostomy supplies is coming in

## 2023-07-09 NOTE — Telephone Encounter (Signed)
I spoke to Sherry Baker / [facility] : [phone] and he confirmed that the order for ostomy supplies was shipped yesterday and should be delivered to patient by end of day today.  I then spoke to patient's niece, Danielle Dess and informed her of the status of the supply delivery.  She said they received a text that the supplies would be delivered between 1200-1500 today.

## 2023-07-29 ENCOUNTER — Telehealth: Payer: Self-pay

## 2023-07-29 DIAGNOSIS — G893 Neoplasm related pain (acute) (chronic): Secondary | ICD-10-CM

## 2023-07-29 NOTE — Telephone Encounter (Signed)
I met with the patient and her niece in the clinic this afternoon.  The patient is complaining of severe pain of her lower back, legs and groin area. This is not new pain. She said that the oxycodone is not helping and she needs something stronger. Her niece, who is now living with her, acknowledged what the patient is reporting. Her niece said they went to Urgent Care today and the wait is 4 hours  They do not want to go to ED so they came to [facility]. I explained that Dr Delford Field is not in the clinic now but I will send him a message.  They also asked about a referral to a pain management clinic. They thought that had already been done.  I told them that a referral was made to palliative care.but I would check with Dr Delford Field about the referral to pain management and request that it be expedited.

## 2023-07-29 NOTE — Telephone Encounter (Signed)
I cannot send any more opiates, referral to pain clinic made

## 2023-07-30 NOTE — Telephone Encounter (Signed)
noted 

## 2023-07-30 NOTE — Telephone Encounter (Signed)
Called patient and gave her the information for referral

## 2023-08-03 ENCOUNTER — Emergency Department (HOSPITAL_BASED_OUTPATIENT_CLINIC_OR_DEPARTMENT_OTHER): Payer: MEDICAID

## 2023-08-03 ENCOUNTER — Encounter (HOSPITAL_BASED_OUTPATIENT_CLINIC_OR_DEPARTMENT_OTHER): Payer: Self-pay | Admitting: Emergency Medicine

## 2023-08-03 ENCOUNTER — Other Ambulatory Visit (HOSPITAL_BASED_OUTPATIENT_CLINIC_OR_DEPARTMENT_OTHER): Payer: Self-pay

## 2023-08-03 ENCOUNTER — Other Ambulatory Visit: Payer: Self-pay

## 2023-08-03 ENCOUNTER — Emergency Department (HOSPITAL_BASED_OUTPATIENT_CLINIC_OR_DEPARTMENT_OTHER)
Admission: EM | Admit: 2023-08-03 | Discharge: 2023-08-03 | Disposition: A | Discharge status: Home / Self Care | Payer: MEDICAID | Attending: Emergency Medicine | Admitting: Emergency Medicine

## 2023-08-03 DIAGNOSIS — K047 Periapical abscess without sinus: Secondary | ICD-10-CM | POA: Insufficient documentation

## 2023-08-03 DIAGNOSIS — R220 Localized swelling, mass and lump, head: Secondary | ICD-10-CM | POA: Diagnosis present

## 2023-08-03 DIAGNOSIS — L03211 Cellulitis of face: Secondary | ICD-10-CM

## 2023-08-03 LAB — CBC WITH DIFFERENTIAL/PLATELET
Abs Immature Granulocytes: 0.05 10*3/uL (ref 0.00–0.07)
Basophils Absolute: 0 10*3/uL (ref 0.0–0.1)
Basophils Relative: 0 %
Eosinophils Absolute: 0.1 10*3/uL (ref 0.0–0.5)
Eosinophils Relative: 1 %
HCT: 39.1 % (ref 36.0–46.0)
Hemoglobin: 12.3 g/dL (ref 12.0–15.0)
Immature Granulocytes: 1 %
Lymphocytes Relative: 15 %
Lymphs Abs: 1.3 10*3/uL (ref 0.7–4.0)
MCH: 28.3 pg (ref 26.0–34.0)
MCHC: 31.5 g/dL (ref 30.0–36.0)
MCV: 89.9 fL (ref 80.0–100.0)
Monocytes Absolute: 1 10*3/uL (ref 0.1–1.0)
Monocytes Relative: 11 %
Neutro Abs: 6.2 10*3/uL (ref 1.7–7.7)
Neutrophils Relative %: 72 %
Platelets: 218 10*3/uL (ref 150–400)
RBC: 4.35 MIL/uL (ref 3.87–5.11)
RDW: 18.5 % — ABNORMAL HIGH (ref 11.5–15.5)
WBC: 8.6 10*3/uL (ref 4.0–10.5)
nRBC: 0 % (ref 0.0–0.2)

## 2023-08-03 LAB — BASIC METABOLIC PANEL
Anion gap: 9 (ref 5–15)
BUN: 22 mg/dL — ABNORMAL HIGH (ref 6–20)
CO2: 23 mmol/L (ref 22–32)
Calcium: 9.5 mg/dL (ref 8.9–10.3)
Chloride: 107 mmol/L (ref 98–111)
Creatinine, Ser: 1.4 mg/dL — ABNORMAL HIGH (ref 0.44–1.00)
GFR, Estimated: 44 mL/min — ABNORMAL LOW (ref >60)
Glucose, Bld: 87 mg/dL (ref 70–99)
Potassium: 4.3 mmol/L (ref 3.5–5.1)
Sodium: 139 mmol/L (ref 135–145)

## 2023-08-03 MED ORDER — IOHEXOL 300 MG/ML  SOLN
100.0000 mL | Freq: Once | INTRAMUSCULAR | Status: AC | PRN
  Administered 2023-08-03: 75 mL via INTRAVENOUS

## 2023-08-03 MED ORDER — KETOROLAC TROMETHAMINE 30 MG/ML IJ SOLN
15.0000 mg | Freq: Once | INTRAMUSCULAR | Status: AC
  Administered 2023-08-03: 15 mg via INTRAVENOUS
  Filled 2023-08-03: qty 1

## 2023-08-03 MED ORDER — KETOROLAC TROMETHAMINE 15 MG/ML IJ SOLN
15.0000 mg | Freq: Once | INTRAMUSCULAR | Status: AC
  Administered 2023-08-03: 15 mg via INTRAVENOUS
  Filled 2023-08-03: qty 1

## 2023-08-03 MED ORDER — OXYCODONE HCL 5 MG PO TABS
5.0000 mg | ORAL_TABLET | ORAL | 0 refills | Status: DC | PRN
Start: 2023-08-03 — End: 2023-12-16

## 2023-08-03 MED ORDER — CLINDAMYCIN PHOSPHATE 300 MG/50ML IV SOLN
300.0000 mg | Freq: Once | INTRAVENOUS | Status: AC
  Administered 2023-08-03: 300 mg via INTRAVENOUS
  Filled 2023-08-03: qty 50

## 2023-08-03 MED ORDER — SODIUM CHLORIDE 0.9 % IV SOLN: INTRAVENOUS | Status: DC | PRN

## 2023-08-03 MED ORDER — CLINDAMYCIN HCL 150 MG PO CAPS: 300.0000 mg | ORAL_CAPSULE | Freq: Four times a day (QID) | ORAL | 0 refills | Status: AC

## 2023-08-03 MED ORDER — OXYCODONE HCL 5 MG PO TABS
5.0000 mg | ORAL_TABLET | Freq: Once | ORAL | Status: AC
  Administered 2023-08-03: 5 mg via ORAL
  Filled 2023-08-03: qty 1

## 2023-08-03 NOTE — ED Notes (Signed)
Patient states did not take BP medications this am

## 2023-08-03 NOTE — ED Triage Notes (Signed)
Pt c/o RT side upper dental pain and facial swelling, redness and swelling noted

## 2023-08-03 NOTE — ED Provider Notes (Signed)
Sherry Baker Provider Note   CSN: [id number] Arrival date & time: 08/03/23  1125     History  Chief Complaint  Patient presents with   Facial Swelling    Sherry Baker is a 57 y.o. female.  Is a 57 year old female is here today for right sided facial swelling and dental pain.  Patient says the pain began a couple of days ago, but the swelling started yesterday.  She has not had fever or chills.  She is not a diabetic.        Home Medications Prior to Admission medications   Medication Sig Start Date End Date Taking? Authorizing Provider  albuterol (VENTOLIN HFA) 108 (90 Base) MCG/ACT inhaler Inhale 2 puffs into the lungs every 6 (six) hours as needed for wheezing or shortness of breath. Patient not taking: Reported on 07/02/2023 01/08/23   Storm Frisk, MD  amLODipine (NORVASC) 10 MG tablet Take 1 tablet (10 mg total) by mouth daily. Patient not taking: Reported on 07/02/2023 02/10/23   Storm Frisk, MD  carvedilol (COREG) 25 MG tablet Take 1 tablet (25 mg total) by mouth 2 (two) times daily with a meal. Patient not taking: Reported on 07/02/2023 02/10/23   Storm Frisk, MD  feeding supplement (ENSURE ENLIVE / ENSURE PLUS) LIQD Take 237 mLs by mouth 3 (three) times daily between meals. Patient not taking: Reported on 07/02/2023 01/08/23   Storm Frisk, MD  oxyCODONE (OXY IR/ROXICODONE) 5 MG immediate release tablet Take 1 tablet (5 mg total) by mouth every 4 (four) hours as needed for severe pain. 07/02/23   Storm Frisk, MD  QUEtiapine (SEROQUEL) 50 MG tablet Place 1 tablet (50 mg total) into feeding tube 2 (two) times daily. Patient not taking: Reported on 07/02/2023 02/10/23   Storm Frisk, MD  sertraline (ZOLOFT) 50 MG tablet Take 1 tablet (50 mg total) by mouth daily. Patient not taking: Reported on 07/02/2023 02/10/23   Storm Frisk, MD  sodium bicarbonate 650 MG tablet Take 1 tablet (650 mg total) by mouth  daily. Patient not taking: Reported on 07/02/2023 02/10/23   Storm Frisk, MD  sulfamethoxazole-trimethoprim (BACTRIM DS) 800-160 MG tablet Take 1 tablet by mouth 2 (two) times daily. 07/02/23   Storm Frisk, MD  umeclidinium-vilanterol (ANORO ELLIPTA) 62.5-25 MCG/ACT AEPB Inhale 1 puff into the lungs daily. Patient not taking: Reported on 07/02/2023 02/10/23   Storm Frisk, MD  valsartan (DIOVAN) 160 MG tablet Take 1 tablet (160 mg total) by mouth daily. Patient not taking: Reported on 07/02/2023 02/10/23   Storm Frisk, MD  ziprasidone (GEODON) 40 MG capsule Take 1 capsule (40 mg total) by mouth 2 (two) times daily with a meal. 11/06/11 01/09/12  Mancel Bale, MD      Allergies    Penicillins    Review of Systems   Review of Systems  Physical Exam Updated Vital Signs BP (!) 168/108   Pulse 99   Temp 99.5 F (37.5 C)   Resp 20   Wt 47.6 kg   SpO2 91%   BMI 19.20 kg/m  Physical Exam Vitals reviewed.  HENT:     Head: Atraumatic.     Mouth/Throat:     Comments: Obvious dental carry of [place] maxillary molar.  No obvious abscess.  Swelling of the cheek.  No swelling of the floor the mouth, tongue not raised, uvula midline, no pooling of secretions. Musculoskeletal:     Cervical  back: Normal range of motion. No rigidity.  Neurological:     General: No focal deficit present.     ED Results / Procedures / Treatments   Labs (all labs ordered are listed, but only abnormal results are displayed) Labs Reviewed  CBC WITH DIFFERENTIAL/PLATELET - Abnormal; Notable for the following components:      Result Value   RDW 18.5 (*)    All other components within normal limits  BASIC METABOLIC PANEL    EKG None  Radiology No results found.  Procedures Procedures    Medications Ordered in ED Medications  clindamycin (CLEOCIN) IVPB 300 mg (300 mg Intravenous New Bag/Given 08/03/23 1325)  0.9 %  sodium chloride infusion ( Intravenous New Bag/Given 08/03/23 1324)   ketorolac (TORADOL) 15 MG/ML injection 15 mg (has no administration in time range)    ED Course/ Medical Decision Making/ A&P                                 Medical Decision Making This is a 57 year old female here today with dental pain and facial swelling.  Differential diagnoses include dental abscess, facial abscess, less likely floor of the mouth infection.  Plan-have started the patient on antibiotics, provided analgesia.  Will obtain imaging of the patient's face.  Basic labs ordered.  Patient will be signed out to Dr. Dalene Seltzer pending CT imaging, disposition.  Amount and/or Complexity of Data Reviewed Labs: ordered. Radiology: ordered.  Risk Prescription drug management.           Final Clinical Impression(s) / ED Diagnoses Final diagnoses:  Dental abscess    Rx / DC Orders ED Discharge Orders     None         Arletha Pili, DO 08/03/23 1347

## 2023-08-03 NOTE — ED Provider Notes (Signed)
  Physical Exam  BP (!) 182/93   Pulse 85   Temp 99.5 F (37.5 C)   Resp 18   Wt 47.6 kg   SpO2 100%   BMI 19.20 kg/m   Physical Exam  Procedures  Procedures  ED Course / MDM    Medical Decision Making Amount and/or Complexity of Data Reviewed Labs: ordered. Radiology: ordered.  Risk Prescription drug management.   ***  Awaiting CT maxillofacial.

## 2023-09-01 ENCOUNTER — Ambulatory Visit: Payer: MEDICAID | Admitting: Family Medicine

## 2023-10-12 ENCOUNTER — Encounter: Payer: Self-pay | Admitting: Nurse Practitioner

## 2023-10-12 ENCOUNTER — Ambulatory Visit: Payer: MEDICAID | Attending: Nurse Practitioner | Admitting: Nurse Practitioner

## 2023-10-12 ENCOUNTER — Other Ambulatory Visit (HOSPITAL_COMMUNITY)
Admission: RE | Admit: 2023-10-12 | Discharge: 2023-10-12 | Disposition: A | Discharge status: Home / Self Care | Payer: MEDICAID | Source: Ambulatory Visit | Attending: Nurse Practitioner | Admitting: Nurse Practitioner

## 2023-10-12 ENCOUNTER — Other Ambulatory Visit: Payer: Self-pay

## 2023-10-12 VITALS — BP 158/100 | HR 93 | Ht 62.0 in | Wt 105.4 lb

## 2023-10-12 DIAGNOSIS — K219 Gastro-esophageal reflux disease without esophagitis: Secondary | ICD-10-CM | POA: Diagnosis not present

## 2023-10-12 DIAGNOSIS — D649 Anemia, unspecified: Secondary | ICD-10-CM

## 2023-10-12 DIAGNOSIS — N322 Vesical fistula, not elsewhere classified: Secondary | ICD-10-CM

## 2023-10-12 DIAGNOSIS — I10 Essential (primary) hypertension: Secondary | ICD-10-CM | POA: Diagnosis not present

## 2023-10-12 DIAGNOSIS — N760 Acute vaginitis: Secondary | ICD-10-CM | POA: Diagnosis present

## 2023-10-12 DIAGNOSIS — R399 Unspecified symptoms and signs involving the genitourinary system: Secondary | ICD-10-CM | POA: Diagnosis not present

## 2023-10-12 DIAGNOSIS — F17200 Nicotine dependence, unspecified, uncomplicated: Secondary | ICD-10-CM

## 2023-10-12 LAB — POCT URINALYSIS DIP (CLINITEK)
Bilirubin, UA: NEGATIVE
Glucose, UA: NEGATIVE mg/dL
Ketones, POC UA: NEGATIVE mg/dL
Nitrite, UA: POSITIVE — AB
POC PROTEIN,UA: >=300 — AB
Spec Grav, UA: 1.025 (ref 1.010–1.025)
Urobilinogen, UA: 0.2 U/dL
pH, UA: 6.5 (ref 5.0–8.0)

## 2023-10-12 MED ORDER — SULFAMETHOXAZOLE-TRIMETHOPRIM 800-160 MG PO TABS
1.0000 | ORAL_TABLET | Freq: Two times a day (BID) | ORAL | 0 refills | Status: AC
  Filled 2023-10-12: qty 10, 5d supply, fill #0

## 2023-10-12 MED ORDER — PANTOPRAZOLE SODIUM 40 MG PO TBEC
40.0000 mg | DELAYED_RELEASE_TABLET | Freq: Every day | ORAL | 1 refills | Status: DC
Start: 2023-10-12 — End: 2024-02-24
  Filled 2023-10-12: qty 90, 90d supply, fill #0

## 2023-10-12 NOTE — Progress Notes (Signed)
Assessment & Plan:  Sherry Baker was seen today for medical management of chronic issues.  Diagnoses and all orders for this visit:  Primary hypertension -     CMP14+EGFR Continue all antihypertensives as prescribed.  Reminded to bring in blood pressure log for follow  up appointment.  RECOMMENDATIONS: DASH/Mediterranean Diets are healthier choices for HTN.    Vesical fistula -     Ambulatory referral to Urology  UTI symptoms UA POSITIVE: ABX sent (Bactrim) -     Urinalysis, Complete -     POCT URINALYSIS DIP (CLINITEK) -     Urine Culture -     sulfamethoxazole-trimethoprim (BACTRIM DS) 800-160 MG tablet; Take 1 tablet by mouth 2 (two) times daily for 5 days.  GERD without esophagitis -     pantoprazole (PROTONIX) 40 MG tablet; Take 1 tablet (40 mg total) by mouth daily. INSTRUCTIONS: Avoid GERD Triggers: acidic, spicy or fried foods, caffeine, coffee, sodas,  alcohol and chocolate.    Acute vaginitis -     Cervicovaginal ancillary only  Normocytic anemia -     CBC with Differential  Tobacco dependence -     CT CHEST LUNG CA SCREEN LOW DOSE W/O CM; Future    Patient has been counseled on age-appropriate routine health concerns for screening and prevention. These are reviewed and up-to-date. Referrals have been placed accordingly. Immunizations are up-to-date or declined.    Subjective:   Chief Complaint  Patient presents with   Medical Management of Chronic Issues    Sherry Baker 57 y.o. female presents to office today for follow up to HTN. She is a patient of Dr. Delford Field and accompanied by her cousin today   HTN Reports not being able to pay for her medications. She does have medicaid. She continues to smoke almost a half PPD of cigarettes. Blood pressure is significantly elevated today. She is currently prescribed amlodipine 10 mg daily, carvedilol 25 mg BID, and valsartan 160 mg daily.   BP Readings from Last 3 Encounters:  10/12/23 (!) 158/100  08/03/23 (!)  166/76  07/02/23 (!) 179/98    Vaginitis She endorses vaginal itching and irritation as well as urinary hesitancy.    GI She has a history of rectal cancer (2018).  PER PCP NOTE:She had an ileostomy placed receive radiation therapy and chemotherapy. 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  States she has been told by Urology ([facility]) there is nothing they can do about her fistula. She states she has to wear an incontinence pad across her belly and still wakes up in the morning with her clothes soaked from the leakage.  There are no signs of skin infection today.   Review of Systems  Constitutional:  Negative for fever, malaise/fatigue and weight loss.  HENT: Negative.  Negative for nosebleeds.   Eyes: Negative.  Negative for blurred vision, double vision and photophobia.  Respiratory: Negative.  Negative for cough and shortness of breath.   Cardiovascular: Negative.  Negative for chest pain, palpitations and leg swelling.  Gastrointestinal: Negative.  Negative for heartburn, nausea and vomiting.  Genitourinary:  Negative for dysuria, flank pain, frequency, hematuria and urgency.       SEE HPI  Musculoskeletal: Negative.  Negative for myalgias.  Neurological: Negative.  Negative for dizziness, focal weakness, seizures and headaches.  Psychiatric/Behavioral: Negative.  Negative for suicidal ideas.     Past Medical History:  Diagnosis Date   Acquired hypothyroidism  Alcohol abuse    Allergy    PCNS swelling   Arthritis    Bipolar 1 disorder (HCC)    Chronic kidney disease    Chronic pain syndrome    Cirrhosis of liver (HCC)    Cocaine abuse (HCC)    COVID-19 virus infection 06/23/2021   Delirium tremens (HCC) 06/22/2021   Depression    Hypertension    Rectal cancer (HCC) 01/21/2017   Suicidal ideation 02/11/2014    Past Surgical History:  Procedure Laterality Date   ABDOMINAL PERINEAL BOWEL  RESECTION N/A 06/18/2017   Procedure: ABDOMINAL PERINEAL RESECTION ERAS PATHWAY;  Surgeon: Romie Levee, MD;  Location: [facility];  Service: General;  Laterality: N/A;   BUBBLE STUDY  11/29/2021   Procedure: BUBBLE STUDY;  Surgeon: Thomasene Ripple, DO;  Location: [facility];  Service: Cardiovascular;;   COLON SURGERY     COLONOSCOPY WITH PROPOFOL Left 01/21/2017   Procedure: COLONOSCOPY WITH PROPOFOL;  Surgeon: Kathi Der, MD;  Location: [facility];  Service: Gastroenterology;  Laterality: Left;   Colostomy     FRACTURE SURGERY     MANDIBLE FRACTURE SURGERY     TEE WITHOUT CARDIOVERSION N/A 11/29/2021   Procedure: TRANSESOPHAGEAL ECHOCARDIOGRAM (TEE);  Surgeon: Thomasene Ripple, DO;  Location: [facility];  Service: Cardiovascular;  Laterality: N/A;    Family History  Problem Relation Age of Onset   Cancer Mother 7       Originating in the abdomen, otherwise unknwon   Cancer - Other Maternal Aunt        Throat    Social History Reviewed with no changes to be made today.   Outpatient Medications Prior to Visit  Medication Sig Dispense Refill   albuterol (VENTOLIN HFA) 108 (90 Base) MCG/ACT inhaler Inhale 2 puffs into the lungs every 6 (six) hours as needed for wheezing or shortness of breath. (Patient not taking: Reported on 07/02/2023) 18 g 1   amLODipine (NORVASC) 10 MG tablet Take 1 tablet (10 mg total) by mouth daily. (Patient not taking: Reported on 07/02/2023) 90 tablet 2   carvedilol (COREG) 25 MG tablet Take 1 tablet (25 mg total) by mouth 2 (two) times daily with a meal. (Patient not taking: Reported on 07/02/2023) 120 tablet 2   feeding supplement (ENSURE ENLIVE / ENSURE PLUS) LIQD Take 237 mLs by mouth 3 (three) times daily between meals. (Patient not taking: Reported on 07/02/2023) 237 mL    oxyCODONE (ROXICODONE) 5 MG immediate release tablet Take 1 tablet (5 mg total) by mouth every 4 (four) hours as needed for severe pain. (Patient not taking: Reported on 10/12/2023) 10 tablet  0   QUEtiapine (SEROQUEL) 50 MG tablet Place 1 tablet (50 mg total) into feeding tube 2 (two) times daily. (Patient not taking: Reported on 07/02/2023) 60 tablet 3   sertraline (ZOLOFT) 50 MG tablet Take 1 tablet (50 mg total) by mouth daily. (Patient not taking: Reported on 07/02/2023) 60 tablet 2   sodium bicarbonate 650 MG tablet Take 1 tablet (650 mg total) by mouth daily. (Patient not taking: Reported on 07/02/2023) 60 tablet 2   umeclidinium-vilanterol (ANORO ELLIPTA) 62.5-25 MCG/ACT AEPB Inhale 1 puff into the lungs daily. (Patient not taking: Reported on 07/02/2023) 60 each 2   valsartan (DIOVAN) 160 MG tablet Take 1 tablet (160 mg total) by mouth daily. (Patient not taking: Reported on 07/02/2023) 90 tablet 3   sulfamethoxazole-trimethoprim (BACTRIM DS) 800-160 MG tablet Take 1 tablet by mouth 2 (two) times daily. (Patient not taking: Reported on  10/12/2023) 20 tablet 0   No facility-administered medications prior to visit.    Allergies  Allergen Reactions   Penicillins Hives, Swelling and Rash    Tolerated Unasyn, Rocephin  Did it involve swelling of the face/tongue/throat, SOB, or low BP? Yes  Did it involve sudden or severe rash/hives, skin peeling, or any reaction on the inside of your mouth or nose? No  Did you need to seek medical attention at a hospital or doctor's office? No  When did it last happen?  <10 years    If all above answers are "NO", may proceed with cephalosporin use.  Has patient had a PCN reaction causing immediate rash, facial/tongue/throat swelling, SOB or lightheadedness with hypotension: Yes, Has patient had a PCN reaction causing severe rash involving mucus membranes or skin necrosis: No, Has patient had a PCN reaction that required hospitalization No, Has patient had a PCN reaction occurring within the last 10 years: Yes, If all of the above answers are "NO", then may proceed with Cephalosporin use.       Objective:    BP (!) 158/100 (BP Location: Left  Arm, Patient Position: Sitting, Cuff Size: Normal)   Pulse 93   Ht 5\' 2"  (1.575 m)   Wt 105 lb 6.4 oz (47.8 kg)   SpO2 100%   BMI 19.28 kg/m  Wt Readings from Last 3 Encounters:  10/12/23 105 lb 6.4 oz (47.8 kg)  08/03/23 105 lb (47.6 kg)  07/02/23 105 lb 9.6 oz (47.9 kg)    Physical Exam Vitals and nursing note reviewed.  Constitutional:      Appearance: She is well-developed.  HENT:     Head: Normocephalic and atraumatic.  Cardiovascular:     Rate and Rhythm: Normal rate and regular rhythm.     Heart sounds: Normal heart sounds. No murmur heard.    No friction rub. No gallop.  Pulmonary:     Effort: Pulmonary effort is normal. No tachypnea or respiratory distress.     Breath sounds: Normal breath sounds. No decreased breath sounds, wheezing, rhonchi or rales.  Chest:     Chest wall: No tenderness.  Abdominal:     General: The ostomy site is clean. Bowel sounds are normal.     Palpations: Abdomen is soft.  Musculoskeletal:        General: Normal range of motion.     Cervical back: Normal range of motion.  Skin:    General: Skin is warm and dry.  Neurological:     Mental Status: She is alert and oriented to person, place, and time.     Coordination: Coordination normal.  Psychiatric:        Behavior: Behavior normal. Behavior is cooperative.        Thought Content: Thought content normal.        Judgment: Judgment normal.          Patient has been counseled extensively about nutrition and exercise as well as the importance of adherence with medications and regular follow-up. The patient was given clear instructions to go to ER or return to medical center if symptoms don't improve, worsen or new problems develop. The patient verbalized understanding.   Follow-up: Return if symptoms worsen or fail to improve.   Claiborne Rigg, FNP-BC [facility] [place], [place] [phone]   10/12/2023, 10:07 PM

## 2023-10-13 ENCOUNTER — Telehealth: Payer: Self-pay | Admitting: Critical Care Medicine

## 2023-10-13 ENCOUNTER — Other Ambulatory Visit: Payer: Self-pay | Admitting: Nurse Practitioner

## 2023-10-13 ENCOUNTER — Ambulatory Visit: Payer: Self-pay | Admitting: *Deleted

## 2023-10-13 DIAGNOSIS — N1832 Chronic kidney disease, stage 3b: Secondary | ICD-10-CM

## 2023-10-13 LAB — URINALYSIS, COMPLETE
Bilirubin, UA: NEGATIVE
Glucose, UA: NEGATIVE
Ketones, UA: NEGATIVE
Nitrite, UA: POSITIVE — AB
Specific Gravity, UA: 1.019 (ref 1.005–1.030)
Urobilinogen, Ur: 0.2 mg/dL (ref 0.2–1.0)
pH, UA: 6.5 (ref 5.0–7.5)

## 2023-10-13 LAB — CBC WITH DIFFERENTIAL/PLATELET
Basophils Absolute: 0 10*3/uL (ref 0.0–0.2)
Basos: 0 %
EOS (ABSOLUTE): 0.3 10*3/uL (ref 0.0–0.4)
Eos: 4 %
Hematocrit: 44.4 % (ref 34.0–46.6)
Hemoglobin: 14 g/dL (ref 11.1–15.9)
Immature Grans (Abs): 0.1 10*3/uL (ref 0.0–0.1)
Immature Granulocytes: 1 %
Lymphocytes Absolute: 1.5 10*3/uL (ref 0.7–3.1)
Lymphs: 20 %
MCH: 27.1 pg (ref 26.6–33.0)
MCHC: 31.5 g/dL (ref 31.5–35.7)
MCV: 86 fL (ref 79–97)
Monocytes Absolute: 0.6 10*3/uL (ref 0.1–0.9)
Monocytes: 8 %
Neutrophils Absolute: 4.9 10*3/uL (ref 1.4–7.0)
Neutrophils: 67 %
Platelets: 314 10*3/uL (ref 150–450)
RBC: 5.16 x10E6/uL (ref 3.77–5.28)
RDW: 15.8 % — ABNORMAL HIGH (ref 11.7–15.4)
WBC: 7.4 10*3/uL (ref 3.4–10.8)

## 2023-10-13 LAB — CMP14+EGFR
ALT: 6 [IU]/L (ref 0–32)
AST: 16 [IU]/L (ref 0–40)
Albumin: 3.9 g/dL (ref 3.8–4.9)
Alkaline Phosphatase: 135 [IU]/L — ABNORMAL HIGH (ref 44–121)
BUN/Creatinine Ratio: 14 (ref 9–23)
BUN: 27 mg/dL — ABNORMAL HIGH (ref 6–24)
Bilirubin Total: 0.2 mg/dL (ref 0.0–1.2)
CO2: 21 mmol/L (ref 20–29)
Calcium: 9.3 mg/dL (ref 8.7–10.2)
Chloride: 104 mmol/L (ref 96–106)
Creatinine, Ser: 1.94 mg/dL — ABNORMAL HIGH (ref 0.57–1.00)
Globulin, Total: 4 g/dL (ref 1.5–4.5)
Glucose: 86 mg/dL (ref 70–99)
Potassium: 5.2 mmol/L (ref 3.5–5.2)
Sodium: 142 mmol/L (ref 134–144)
Total Protein: 7.9 g/dL (ref 6.0–8.5)
eGFR: 30 mL/min/{1.73_m2} — ABNORMAL LOW (ref >59)

## 2023-10-13 LAB — MICROSCOPIC EXAMINATION
Casts: NONE SEEN /[LPF]
RBC, Urine: >30 /[HPF] — AB (ref 0–2)
WBC, UA: >30 /[HPF] — AB (ref 0–5)

## 2023-10-13 MED ORDER — AMLODIPINE BESYLATE 10 MG PO TABS: 10.0000 mg | ORAL_TABLET | Freq: Every day | ORAL | 1 refills | Status: DC

## 2023-10-13 NOTE — Telephone Encounter (Signed)
Reason for Disposition . [1] Caller requesting a prescription renewal (no refills left), no triage required, AND [2] triager able to renew prescription per department policy  Answer Assessment - Initial Assessment Questions 1. DRUG NAME: "What medicine do you need to have refilled?"     Amlodipine 10 mg 2. REFILLS REMAINING: "How many refills are remaining?" (Note: The label on the medicine or pill bottle will show how many refills are remaining. If there are no refills remaining, then a renewal may be needed.)     yes 3. EXPIRATION DATE: "What is the expiration date?" (Note: The label states when the prescription will expire, and thus can no longer be refilled.)     na 4. PRESCRIBING HCP: "Who prescribed it?" Reason: If prescribed by specialist, call should be referred to that group.     PCP 5. SYMPTOMS: "Do you have any symptoms?"     headache  Protocols used: Medication Refill and Renewal Call-A-AH

## 2023-10-13 NOTE — Addendum Note (Signed)
Addended by: Arbie Cookey on: 10/13/2023 09:14 AM   Modules accepted: Orders

## 2023-10-13 NOTE — Telephone Encounter (Signed)
Copied from CRM [id number]. Topic: General - Other >> Oct 13, 2023  2:48 PM Marlow Baars wrote: Reason for CRM: The cousin of the patient, Crystal called in returning Cassandra's call. Please assist further

## 2023-10-13 NOTE — Telephone Encounter (Signed)
  Chief Complaint: patient had elevated BP at appointment yesterday- per visit patient to continue her present medications.  Symptoms: headache, unable to monitor BP at home- per patient request Rx sent to [facility]/[place] location Frequency: patient advised she needs BP recheck after she has been on medications   Disposition: [] ED /[] Urgent Care (no appt availability in office) / [] Appointment(In office/virtual)/ []  [facility]/ [] Home Care/ [] Refused Recommended Disposition /[] [facility]/ [x]  Follow-up with PCP Additional Notes: Patient requested Amlodipine sent to closer  pharmacy- she did not get it at appointment yesterday. Rx has been sent per OV notes. Patient does not have a way to monitor her BP- can she get Rx for BP monitor?

## 2023-10-13 NOTE — Telephone Encounter (Signed)
Please see previous message

## 2023-10-13 NOTE — Telephone Encounter (Signed)
Spoke with patient cousin Crystal. Aggie Cosier is not on [place]. Crystal voiced that patient placed her on the patient DPR on yesterday. Also voiced she knows the reason for patient call. Crystal said she was calling to know if her insurance pays for a BP cuff. Advised that it does not. Crystal said that was ok. She will get her one.

## 2023-10-13 NOTE — Telephone Encounter (Signed)
Call placed to patient unable to reach message left on VM.   

## 2023-10-13 NOTE — Telephone Encounter (Signed)
Opened in Error.

## 2023-10-14 LAB — CERVICOVAGINAL ANCILLARY ONLY
Bacterial Vaginitis (gardnerella): NEGATIVE
Candida Glabrata: NEGATIVE
Candida Vaginitis: NEGATIVE
Chlamydia: NEGATIVE
Comment: NEGATIVE
Comment: NEGATIVE
Comment: NEGATIVE
Comment: NEGATIVE
Comment: NEGATIVE
Comment: NORMAL
Neisseria Gonorrhea: NEGATIVE
Trichomonas: NEGATIVE

## 2023-10-14 LAB — URINE CULTURE

## 2023-11-16 ENCOUNTER — Emergency Department (HOSPITAL_COMMUNITY)
Admission: EM | Admit: 2023-11-16 | Discharge: 2023-11-16 | Disposition: A | Discharge status: Home / Self Care | Payer: MEDICAID | Attending: Emergency Medicine | Admitting: Emergency Medicine

## 2023-11-16 ENCOUNTER — Other Ambulatory Visit: Payer: Self-pay

## 2023-11-16 ENCOUNTER — Encounter (HOSPITAL_COMMUNITY): Payer: Self-pay | Admitting: *Deleted

## 2023-11-16 ENCOUNTER — Emergency Department (HOSPITAL_COMMUNITY): Payer: MEDICAID

## 2023-11-16 DIAGNOSIS — E876 Hypokalemia: Secondary | ICD-10-CM | POA: Diagnosis not present

## 2023-11-16 DIAGNOSIS — R109 Unspecified abdominal pain: Secondary | ICD-10-CM | POA: Diagnosis present

## 2023-11-16 DIAGNOSIS — B379 Candidiasis, unspecified: Secondary | ICD-10-CM | POA: Diagnosis not present

## 2023-11-16 DIAGNOSIS — N3000 Acute cystitis without hematuria: Secondary | ICD-10-CM | POA: Diagnosis not present

## 2023-11-16 DIAGNOSIS — Z85048 Personal history of other malignant neoplasm of rectum, rectosigmoid junction, and anus: Secondary | ICD-10-CM | POA: Diagnosis not present

## 2023-11-16 DIAGNOSIS — N322 Vesical fistula, not elsewhere classified: Secondary | ICD-10-CM

## 2023-11-16 DIAGNOSIS — N321 Vesicointestinal fistula: Secondary | ICD-10-CM | POA: Diagnosis not present

## 2023-11-16 LAB — URINALYSIS, ROUTINE W REFLEX MICROSCOPIC
Bilirubin Urine: NEGATIVE
Glucose, UA: NEGATIVE mg/dL
Ketones, ur: NEGATIVE mg/dL
Nitrite: NEGATIVE
Protein, ur: 100 mg/dL — AB
Specific Gravity, Urine: 1.013 (ref 1.005–1.030)
WBC, UA: >50 WBC/hpf (ref 0–5)
pH: 6 (ref 5.0–8.0)

## 2023-11-16 LAB — COMPREHENSIVE METABOLIC PANEL
ALT: 9 U/L (ref 0–44)
AST: 10 U/L — ABNORMAL LOW (ref 15–41)
Albumin: 2.5 g/dL — ABNORMAL LOW (ref 3.5–5.0)
Alkaline Phosphatase: 65 U/L (ref 38–126)
Anion gap: 5 (ref 5–15)
BUN: 20 mg/dL (ref 6–20)
CO2: 15 mmol/L — ABNORMAL LOW (ref 22–32)
Calcium: 7 mg/dL — ABNORMAL LOW (ref 8.9–10.3)
Chloride: 114 mmol/L — ABNORMAL HIGH (ref 98–111)
Creatinine, Ser: 1.36 mg/dL — ABNORMAL HIGH (ref 0.44–1.00)
GFR, Estimated: 46 mL/min — ABNORMAL LOW (ref >60)
Glucose, Bld: 82 mg/dL (ref 70–99)
Potassium: 3 mmol/L — ABNORMAL LOW (ref 3.5–5.1)
Sodium: 134 mmol/L — ABNORMAL LOW (ref 135–145)
Total Bilirubin: 0.4 mg/dL (ref 0.0–1.2)
Total Protein: 6.2 g/dL — ABNORMAL LOW (ref 6.5–8.1)

## 2023-11-16 LAB — CBC WITH DIFFERENTIAL/PLATELET
Abs Immature Granulocytes: 0.03 10*3/uL (ref 0.00–0.07)
Basophils Absolute: 0 10*3/uL (ref 0.0–0.1)
Basophils Relative: 0 %
Eosinophils Absolute: 0.2 10*3/uL (ref 0.0–0.5)
Eosinophils Relative: 3 %
HCT: 35 % — ABNORMAL LOW (ref 36.0–46.0)
Hemoglobin: 10.9 g/dL — ABNORMAL LOW (ref 12.0–15.0)
Immature Granulocytes: 1 %
Lymphocytes Relative: 36 %
Lymphs Abs: 1.7 10*3/uL (ref 0.7–4.0)
MCH: 27.2 pg (ref 26.0–34.0)
MCHC: 31.1 g/dL (ref 30.0–36.0)
MCV: 87.3 fL (ref 80.0–100.0)
Monocytes Absolute: 0.4 10*3/uL (ref 0.1–1.0)
Monocytes Relative: 9 %
Neutro Abs: 2.4 10*3/uL (ref 1.7–7.7)
Neutrophils Relative %: 51 %
Platelets: 177 10*3/uL (ref 150–400)
RBC: 4.01 MIL/uL (ref 3.87–5.11)
RDW: 17.9 % — ABNORMAL HIGH (ref 11.5–15.5)
WBC: 4.8 10*3/uL (ref 4.0–10.5)
nRBC: 0 % (ref 0.0–0.2)

## 2023-11-16 LAB — LIPASE, BLOOD: Lipase: 57 U/L — ABNORMAL HIGH (ref 11–51)

## 2023-11-16 MED ORDER — POTASSIUM CHLORIDE CRYS ER 20 MEQ PO TBCR: 20.0000 meq | EXTENDED_RELEASE_TABLET | Freq: Once | ORAL | Status: DC

## 2023-11-16 MED ORDER — SULFAMETHOXAZOLE-TRIMETHOPRIM 800-160 MG PO TABS: 1.0000 | ORAL_TABLET | Freq: Two times a day (BID) | ORAL | 0 refills | Status: AC

## 2023-11-16 MED ORDER — ONDANSETRON HCL 4 MG/2ML IJ SOLN
4.0000 mg | Freq: Once | INTRAMUSCULAR | Status: AC
  Administered 2023-11-16: 4 mg via INTRAVENOUS
  Filled 2023-11-16: qty 2

## 2023-11-16 MED ORDER — POTASSIUM CHLORIDE CRYS ER 20 MEQ PO TBCR
40.0000 meq | EXTENDED_RELEASE_TABLET | Freq: Once | ORAL | Status: AC
  Administered 2023-11-16: 40 meq via ORAL
  Filled 2023-11-16: qty 2

## 2023-11-16 MED ORDER — ACETAMINOPHEN 325 MG PO TABS: 650.0000 mg | ORAL_TABLET | Freq: Four times a day (QID) | ORAL | 0 refills | Status: DC | PRN

## 2023-11-16 MED ORDER — IOHEXOL 300 MG/ML  SOLN
100.0000 mL | Freq: Once | INTRAMUSCULAR | Status: AC | PRN
  Administered 2023-11-16: 100 mL via INTRAVENOUS

## 2023-11-16 MED ORDER — HYDROCODONE-ACETAMINOPHEN 5-325 MG PO TABS
1.0000 | ORAL_TABLET | Freq: Once | ORAL | Status: AC
  Administered 2023-11-16: 1 via ORAL
  Filled 2023-11-16: qty 1

## 2023-11-16 MED ORDER — NYSTATIN 100000 UNIT/GM EX POWD
Freq: Once | CUTANEOUS | Status: AC
  Administered 2023-11-16: 1 via TOPICAL
  Filled 2023-11-16: qty 15

## 2023-11-16 MED ORDER — POTASSIUM CHLORIDE ER 10 MEQ PO TBCR: 10.0000 meq | EXTENDED_RELEASE_TABLET | Freq: Every day | ORAL | 0 refills | Status: DC

## 2023-11-16 MED ORDER — SULFAMETHOXAZOLE-TRIMETHOPRIM 800-160 MG PO TABS
1.0000 | ORAL_TABLET | Freq: Once | ORAL | Status: AC
  Administered 2023-11-16: 1 via ORAL
  Filled 2023-11-16: qty 1

## 2023-11-16 MED ORDER — POTASSIUM CHLORIDE 10 MEQ/100ML IV SOLN: 10.0000 meq | Freq: Once | INTRAVENOUS | Status: DC

## 2023-11-16 MED ORDER — OXYCODONE HCL 5 MG PO TABS: 5.0000 mg | ORAL_TABLET | Freq: Four times a day (QID) | ORAL | 0 refills | Status: DC | PRN

## 2023-11-16 MED ORDER — FLUCONAZOLE 150 MG PO TABS
150.0000 mg | ORAL_TABLET | Freq: Once | ORAL | Status: AC
  Administered 2023-11-16: 150 mg via ORAL
  Filled 2023-11-16: qty 1

## 2023-11-16 NOTE — ED Notes (Signed)
 Pt given meal tray.

## 2023-11-16 NOTE — ED Notes (Signed)
 Pt given pants, socks and a brief to go home in. Pt cousin is coming to pick pt up

## 2023-11-16 NOTE — ED Notes (Signed)
Pt not in room, pt in CT.  

## 2023-11-16 NOTE — ED Triage Notes (Addendum)
 BIB [facility] from home for abd pain x1 week, progressively worsening, h/o end stage rectal CA, colostomy leaking at scar, changed PTA, self treated with 2 goodies powder and 4 Loco, BP low at 76/48, up to 94/68 after 500cc NS IVF bolus. NSL 20g R hand. Alert, NAD, calm, interactive, scrolling on cell phone.

## 2023-11-16 NOTE — ED Notes (Signed)
 EDP at [facility]

## 2023-11-16 NOTE — ED Provider Notes (Signed)
 [place] EMERGENCY DEPARTMENT AT [facility] Provider Note   CSN: [id number] Arrival date & time: 11/16/23  1513     History  Chief Complaint  Patient presents with   Abdominal Pain    Sherry Baker is a 58 y.o. female with history of rectal cancer status postchemotherapy, radiation therapy, and colonic resection, with subsequent ostomy, abdominal perineal resection by Dr. Debby in 2018, presenting to ED with multiple complaints.  The patient is at times difficult to understand, and is not clear to me what her chief complaint is.  She reports predominantly she is having issues with chronic abdominal pain, nausea, she has also noted some leaking from her lower central abdominal wall, suprapubic, which she says is soaking my pants.  She notes that she is having blood and burning with urination.  She also notes that there is a red rash that is developed in her gluteal cleft and her lower abdominal pannus she is worried about an infection.  She says she currently lives in a hotel room, cannot afford to see doctors as an outpatient, had been referred to a pain management clinic but was not able to go there.  Last time she was seen by the surgeon per my review of the records was in 2018.  She had been prescribed oxycodone  as recently as November 2024, short course in the ED, but tells me oxycodone  does not work for me.  She does not seem to know what pain medicines worked for her.  HPI     Home Medications Prior to Admission medications   Medication Sig Start Date End Date Taking? Authorizing Provider  acetaminophen  (TYLENOL ) 325 MG tablet Take 2 tablets (650 mg total) by mouth every 6 (six) hours as needed for up to 30 doses for moderate pain (pain score 4-6) or mild pain (pain score 1-3). 11/16/23  Yes Kennadee Walthour, Donnice PARAS, MD  oxyCODONE  (ROXICODONE ) 5 MG immediate release tablet Take 1 tablet (5 mg total) by mouth every 6 (six) hours as needed for up to 8 doses for severe  pain (pain score 7-10). 11/16/23  Yes Yanni Ruberg, Donnice PARAS, MD  potassium chloride  (KLOR-CON ) 10 MEQ tablet Take 1 tablet (10 mEq total) by mouth daily. 11/16/23 12/16/23 Yes Jerzie Bieri, Donnice PARAS, MD  sulfamethoxazole -trimethoprim  (BACTRIM  DS) 800-160 MG tablet Take 1 tablet by mouth 2 (two) times daily for 10 days. 11/16/23 11/26/23 Yes Meko Bellanger, Donnice PARAS, MD  albuterol  (VENTOLIN  HFA) 108 [phone] Base) MCG/ACT inhaler Inhale 2 puffs into the lungs every 6 (six) hours as needed for wheezing or shortness of breath. Patient not taking: Reported on 07/02/2023 01/08/23   Brien Belvie BRAVO, MD  amLODipine  (NORVASC ) 10 MG tablet Take 1 tablet (10 mg total) by mouth daily. 10/13/23   Fleming, Zelda W, NP  carvedilol  (COREG ) 25 MG tablet Take 1 tablet (25 mg total) by mouth 2 (two) times daily with a meal. Patient not taking: Reported on 07/02/2023 02/10/23   Brien Belvie BRAVO, MD  feeding supplement (ENSURE ENLIVE / ENSURE PLUS) LIQD Take 237 mLs by mouth 3 (three) times daily between meals. Patient not taking: Reported on 07/02/2023 01/08/23   Brien Belvie BRAVO, MD  oxyCODONE  (ROXICODONE ) 5 MG immediate release tablet Take 1 tablet (5 mg total) by mouth every 4 (four) hours as needed for severe pain. Patient not taking: Reported on 10/12/2023 08/03/23   Dreama Longs, MD  pantoprazole  (PROTONIX ) 40 MG tablet Take 1 tablet (40 mg total) by mouth daily. 10/12/23  Theotis Haze ORN, NP  QUEtiapine  (SEROQUEL ) 50 MG tablet Place 1 tablet (50 mg total) into feeding tube 2 (two) times daily. Patient not taking: Reported on 07/02/2023 02/10/23   Brien Belvie BRAVO, MD  sertraline  (ZOLOFT ) 50 MG tablet Take 1 tablet (50 mg total) by mouth daily. Patient not taking: Reported on 07/02/2023 02/10/23   Brien Belvie BRAVO, MD  sodium bicarbonate  650 MG tablet Take 1 tablet (650 mg total) by mouth daily. Patient not taking: Reported on 07/02/2023 02/10/23   Brien Belvie BRAVO, MD  umeclidinium-vilanterol (ANORO ELLIPTA ) 62.5-25 MCG/ACT AEPB Inhale 1 puff  into the lungs daily. Patient not taking: Reported on 07/02/2023 02/10/23   Brien Belvie BRAVO, MD  valsartan  (DIOVAN ) 160 MG tablet Take 1 tablet (160 mg total) by mouth daily. Patient not taking: Reported on 07/02/2023 02/10/23   Brien Belvie BRAVO, MD  ziprasidone  (GEODON ) 40 MG capsule Take 1 capsule (40 mg total) by mouth 2 (two) times daily with a meal. 11/06/11 01/09/12  Lorriane Holmes, MD      Allergies    Penicillins    Review of Systems   Review of Systems  Physical Exam Updated Vital Signs BP (!) 89/77   Pulse 64   Temp (!) 97.5 F (36.4 C) (Oral)   Resp 18   Wt 47.6 kg   SpO2 92%   BMI 19.20 kg/m  Physical Exam Constitutional:      General: She is not in acute distress. HENT:     Head: Normocephalic and atraumatic.  Eyes:     Conjunctiva/sclera: Conjunctivae normal.     Pupils: Pupils are equal, round, and reactive to light.  Cardiovascular:     Rate and Rhythm: Normal rate and regular rhythm.  Pulmonary:     Effort: Pulmonary effort is normal. No respiratory distress.  Abdominal:     General: There is no distension.     Tenderness: There is no abdominal tenderness.     Comments: There is irritation or redness along the lower abdominal muscle wall, under the abdominal pannus, there is some minor redness of the skin around the ostomy bag site, ostomy with a minimal amount of output; there is redness and mild drainage of the gluteal cleft, with sacral decubitus ulceration noted, no crepitus  Skin:    General: Skin is warm and dry.  Neurological:     General: No focal deficit present.     Mental Status: She is alert. Mental status is at baseline.  Psychiatric:        Mood and Affect: Mood normal.        Behavior: Behavior normal.     ED Results / Procedures / Treatments   Labs (all labs ordered are listed, but only abnormal results are displayed) Labs Reviewed  COMPREHENSIVE METABOLIC PANEL - Abnormal; Notable for the following components:      Result Value    Sodium 134 (*)    Potassium 3.0 (*)    Chloride 114 (*)    CO2 15 (*)    Creatinine, Ser 1.36 (*)    Calcium  7.0 (*)    Total Protein 6.2 (*)    Albumin  2.5 (*)    AST 10 (*)    GFR, Estimated 46 (*)    All other components within normal limits  CBC WITH DIFFERENTIAL/PLATELET - Abnormal; Notable for the following components:   Hemoglobin 10.9 (*)    HCT 35.0 (*)    RDW 17.9 (*)    All other components within  normal limits  LIPASE, BLOOD - Abnormal; Notable for the following components:   Lipase 57 (*)    All other components within normal limits  URINALYSIS, ROUTINE W REFLEX MICROSCOPIC - Abnormal; Notable for the following components:   APPearance CLOUDY (*)    Hgb urine dipstick LARGE (*)    Protein, ur 100 (*)    Leukocytes,Ua LARGE (*)    Bacteria, UA FEW (*)    Non Squamous Epithelial 0-5 (*)    All other components within normal limits  URINE CULTURE    EKG None  Radiology CT ABDOMEN PELVIS W CONTRAST Result Date: 11/16/2023 CLINICAL DATA:  Rectal cancer, status post resection with colostomy. Abdominal pain x1 week. Hypotension. Sacral decubitus ulcer. EXAM: CT ABDOMEN AND PELVIS WITH CONTRAST TECHNIQUE: Multidetector CT imaging of the abdomen and pelvis was performed using the standard protocol following bolus administration of intravenous contrast. RADIATION DOSE REDUCTION: This exam was performed according to the departmental dose-optimization program which includes automated exposure control, adjustment of the mA and/or kV according to patient size and/or use of iterative reconstruction technique. CONTRAST:  OMNIPAQUE  IOHEXOL  300 MG/ML  SOLN COMPARISON:  06/30/2023 FINDINGS: Lower chest: Subpleural reticulation/fibrosis at the lung bases. Hepatobiliary: Liver is within normal limits. Gallbladder is unremarkable. No intrahepatic or extrahepatic ductal dilatation. Pancreas: Within normal limits. Spleen: Within normal limits. Adrenals/Urinary Tract: Adrenal glands are  within normal limits. Right renal cortical scarring/atrophy. Mild left lower pole renal cortical scarring. No hydronephrosis. Thick-walled bladder with mucosal hyperenhancement, chronic. This appearance raises the possibility of cystitis, possibly secondary to radiation changes. Notably, there is a suspected fistulous communication between the anterior bladder dome (series 2/image 58) and the midline lower anterior abdominal wall (series 2/image 87), suspicious for vesicocutaneous fistula. Stomach/Bowel: Stomach is notable for a tiny hiatal hernia. No evidence of bowel obstruction. Polypoid nodular thickening in the proximal duodenum (series 2/image 26), poorly evaluated. Underlying mass not excluded. Normal appendix (series 2/image 59). Status post low anterior resection with left mid abdominal colostomy. Moderate fat containing parastomal hernia, unchanged. Postprocedural changes with presacral stranding. Vascular/Lymphatic: No evidence of abdominal aortic aneurysm. Atherosclerotic calcifications of the abdominal aorta and branch vessels, although vessels remain patent. No suspicious abdominopelvic lymphadenopathy. Reproductive: Uterus is grossly unremarkable. No adnexal masses. Other: No abdominopelvic ascites. Musculoskeletal: Stable soft tissue thickening overlying the coccyx, related to known decubitus ulcer (series 2/image 53). No associated fluid collection/abscess. No osseous destruction to suggest acute osteomyelitis. IMPRESSION: Status post low anterior resection with left mid abdominal colostomy. No evidence of recurrent or metastatic disease. Polypoid nodular thickening in the proximal duodenum, poorly evaluated. Underlying mass not excluded. Endoscopy is suggested for further evaluation. 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 Stable soft tissue thickening overlying the coccyx, related to known decubitus ulcer. No associated  fluid collection/abscess. No osseous destruction to suggest acute osteomyelitis. Electronically Signed   By: Pinkie Pebbles M.D.   On: 11/16/2023 19:48    Procedures Procedures    Medications Ordered in ED Medications  fluconazole  (DIFLUCAN ) tablet 150 mg (150 mg Oral Given 11/16/23 1705)  nystatin  (MYCOSTATIN /NYSTOP ) topical powder (1 Application Topical Given 11/16/23 1706)  HYDROcodone -acetaminophen  (NORCO/VICODIN) 5-325 MG per tablet 1 tablet (1 tablet Oral Given 11/16/23 1705)  ondansetron  (ZOFRAN ) injection 4 mg (4 mg Intravenous Given 11/16/23 1705)  iohexol  (OMNIPAQUE ) 300 MG/ML solution 100 mL (100 mLs Intravenous Contrast Given 11/16/23 1817)  potassium chloride  SA (KLOR-CON  M) CR tablet 40 mEq (40 mEq Oral Given 11/16/23  1842)  sulfamethoxazole -trimethoprim  (BACTRIM  DS) 800-160 MG per tablet 1 tablet (1 tablet Oral Given 11/16/23 2042)    ED Course/ Medical Decision Making/ A&P                                 Medical Decision Making Amount and/or Complexity of Data Reviewed Labs: ordered. Radiology: ordered.  Risk OTC drugs. Prescription drug management.   Patient is here with a multitude of complaints, some of these may be chronic abdominal pain complaints, but there is likelihood of cellulitis, yeast infection, UTI, or intra-abdominal infection based on the symptoms.  We will therefore pursue a workup in this direction.  Patient had been given 500 cc of fluid by EMS reported hypotension on scene, blood pressure is low but physiologically normal for her small size and habitus here in the ED.  Many of her issues appear to be socially related as she does not have many financial means, lives in a hotel, and cannot get around to see her doctors.  I did review her external records as noted above including her surgical record in 2018.  I also reviewed her PDMP report.  I personally reviewed interpreted patient's labs and imaging today, notable for likely UTI.  Chronic  hypokalemia potassium 3.0, low albumin , consistent with poor oral intake.  Otherwise labs are unremarkable.  Medications ordered here  - nystatin  powder ordered and patient will have x-ray to go home with in a bottle.  Bactrim  for UTI.  CT imaging consistent with potential bladder colonic fistula, this may be a source of her dysuria.  She will be treated with antibiotics here.  There is no operative surgical emergency on her CT imaging, and have a low suspicion for sepsis.  The patient reports he does a family member can help her pick up her prescriptions from the pharmacy and she is comfortable having the sent to the pharmacy.  She is comfortable going back to her motel.        Final Clinical Impression(s) / ED Diagnoses Final diagnoses:  Hypokalemia  Acute cystitis without hematuria  Yeast infection  Bladder fistula    Rx / DC Orders ED Discharge Orders          Ordered    potassium chloride  (KLOR-CON ) 10 MEQ tablet  Daily        11/16/23 2040    sulfamethoxazole -trimethoprim  (BACTRIM  DS) 800-160 MG tablet  2 times daily        11/16/23 2040    oxyCODONE  (ROXICODONE ) 5 MG immediate release tablet  Every 6 hours PRN        11/16/23 2040    acetaminophen  (TYLENOL ) 325 MG tablet  Every 6 hours PRN        11/16/23 2040              Cottie Donnice PARAS, MD 11/16/23 2045

## 2023-11-16 NOTE — Discharge Instructions (Addendum)
 Use the nystatin  powder lightly layered on the sores on your bottom (butt) and your lower stomach twice per day for 10 days.  You are being treated for urinary tract infection, and low potassium.  You may also have a yeast infection.  I prescribed you medications that you will need to pick up from the pharmacy and take as directed including antibiotics, beginning tomorrow.

## 2023-11-18 LAB — URINE CULTURE: Culture: >=100000 — AB

## 2023-11-19 ENCOUNTER — Telehealth (HOSPITAL_BASED_OUTPATIENT_CLINIC_OR_DEPARTMENT_OTHER): Payer: Self-pay

## 2023-11-19 NOTE — Progress Notes (Signed)
ED Antimicrobial Stewardship Positive Culture Follow Up   Sherry Baker is an 58 y.o. female who presented to [facility] on 11/16/2023 with a chief complaint of  Chief Complaint  Patient presents with   Abdominal Pain    Recent Results (from the past 720 hours)  Urine Culture     Status: Abnormal   Collection Time: 11/16/23  7:23 PM   Specimen: Urine, Clean Catch  Result Value Ref Range Status   Specimen Description   Final    URINE, CLEAN CATCH Performed at [facility], [address] [place], [place] [address]    Special Requests   Final    NONE Performed at [facility], [address], [place], [place] [address]    Culture (A)  Final    >=100,000 COLONIES/mL KLEBSIELLA PNEUMONIAE Confirmed Extended Spectrum Beta-Lactamase Producer (ESBL).  In bloodstream infections from ESBL organisms, carbapenems are preferred over piperacillin/tazobactam. They are shown to have a lower risk of mortality.    Report Status 11/18/2023 FINAL  Final   Organism ID, Bacteria KLEBSIELLA PNEUMONIAE (A)  Final      Susceptibility   Klebsiella pneumoniae - MIC*    AMPICILLIN >=32 RESISTANT Resistant     CEFAZOLIN >=64 RESISTANT Resistant     CEFEPIME >=32 RESISTANT Resistant     CEFTRIAXONE >=64 RESISTANT Resistant     CIPROFLOXACIN 2 RESISTANT Resistant     GENTAMICIN >=16 RESISTANT Resistant     IMIPENEM <=0.25 SENSITIVE Sensitive     NITROFURANTOIN <=16 SENSITIVE Sensitive     TRIMETH/SULFA >=320 RESISTANT Resistant     AMPICILLIN/SULBACTAM >=32 RESISTANT Resistant     PIP/TAZO 16 SENSITIVE Sensitive ug/mL    * >=100,000 COLONIES/mL KLEBSIELLA PNEUMONIAE    [x]  Treated with Bactrim, organism resistant to prescribed antimicrobial []  Patient discharged originally without antimicrobial agent and treatment is now indicated  5 YOF who presented with chronic abd pain, nausea, and leaking around abd wall/suprapubic area. Also noted burning with urination.  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 Noted urine culture grew ESBL Kleb PNA with limited oral options.    Call the patient for a symptom check: - If the patient remains afebrile with continued UTI symptoms, burning with urination, noted chronic abd pain. D/c Bactrim and change to Macrobid 100 mg po bid x 7 days. If symptoms persist or do not improve after starting the new medication - the patient will need to be re-evaluated.  - If the patient is having worsening systemic symptoms when contacted - including fever or chills - the patient should go ahead and return now for re-evaluation.  ED Provider: Fulton Reek, MD  Thank you for allowing pharmacy to be a part of this patient's care.  Georgina Pillion, PharmD, BCPS, BCIDP Infectious Diseases Clinical Pharmacist 11/19/2023 8:17 AM   **Pharmacist phone directory can now be found on [email or web address] ([email or web address]).  Listed under [facility].

## 2023-11-19 NOTE — Telephone Encounter (Signed)
Post ED Visit - Positive Culture Follow-up: Unsuccessful Patient Follow-up  Culture assessed and recommendations reviewed by:  []  Enzo Bi, Pharm.D. []  Celedonio Miyamoto, Pharm.D., BCPS AQ-ID []  Garvin Fila, Pharm.D., BCPS [x]  Georgina Pillion, Pharm.D., BCPS []  Rio Chiquito, [place].D., BCPS, AAHIVP []  Estella Husk, Pharm.D., BCPS, AAHIVP []  Sherlynn Carbon, PharmD []  Pollyann Samples, PharmD, BCPS  Positive urine culture  []  Patient discharged without antimicrobial prescription and treatment is now indicated [x]  Organism is resistant to prescribed ED discharge antimicrobial []  Patient with positive blood cultures  Plan: Call pt to d/c Bactrim and start Macrobid 100 mg  po BID x 7 days. If symptoms persist pt needs to be re-evaluated. IF having fevers/chills or systemic signs when contacted pt should return for re-evaluation now.   Per ED provider Marlene Bast, MD    Unable to contact patient after 3 attempts, letter will be sent to address on file  Sandria Senter 11/19/2023, 1:49 PM

## 2023-12-07 ENCOUNTER — Ambulatory Visit: Payer: Self-pay | Admitting: Critical Care Medicine

## 2023-12-07 ENCOUNTER — Other Ambulatory Visit: Payer: Self-pay | Admitting: Critical Care Medicine

## 2023-12-07 NOTE — Telephone Encounter (Signed)
Appointment made for 2/12

## 2023-12-07 NOTE — Telephone Encounter (Signed)
Called [facility] and they advised that they would give patient a call and set her up with a new provider since hers had retired and assist her further.

## 2023-12-07 NOTE — Telephone Encounter (Signed)
Copied from CRM [id number]. Topic: Clinical - Medication Refill >> Dec 07, 2023 10:15 AM Hector Shade B wrote: Most Recent Primary Care Visit:  Provider: Bertram Denver W  Department: [facility]  Visit Type: OFFICE VISIT  Date: 10/12/2023  Medication: xyCODONE (ROXICODONE) 5 MG immediate release tablet QUEtiapine (SEROQUEL) 50 MG tablet sertraline (ZOLOFT) 50 MG tablet  Has the patient contacted their pharmacy? Yes (Agent: If no, request that the patient contact the pharmacy for the refill. If patient does not wish to contact the pharmacy document the reason why and proceed with request.) (Agent: If yes, when and what did the pharmacy advise?)Was told to call provider to request new refill   Is this the correct pharmacy for this prescription? Yes If no, delete pharmacy and type the correct one.  This is the patient's preferred pharmacy:  [id number] - [place], [place] - [address] AT [facility] OF [address] [place] [address] Phone: [phone] Fax: [phone]  [facility] [address] [place] [address] Phone: [phone] Fax: [phone]  [facility] [address] [place] [address] Phone: [phone] Fax: [phone]  [facility] [address] [place] [address] Phone: [phone] Fax: [phone]   Has the prescription been filled recently? Yes  Is the patient out of the medication? Yes  Has the patient been seen for an appointment in the last year OR does the patient have an upcoming appointment? Yes  Can we respond through MyChart? N/A  Agent: Please be advised that Rx refills may take up to 3 business days. We ask that you follow-up with your pharmacy.

## 2023-12-07 NOTE — Telephone Encounter (Signed)
  Chief Complaint: Medication Refill Symptoms: feeling depressed & chronic pain where she had a recent procedure Frequency: chronic per patient Pertinent Negatives: Patient denies chest pain, nausea, vomiting, fever Disposition: [] ED /[] Urgent Care (no appt availability in office) / [] Appointment(In office/virtual)/ []  [place] [facility]/ [] Home Care/ [] Refused Recommended Disposition /[]  Mobile Bus/ []  Follow-up with PCP Additional Notes: Patient called and advised that she is still in pain and she ran out of her pain medication last week.  She states that she also has been very depressed and crying.  She states that she has been staying by herself in a hotel but she is going through a lot.  Patient was just calling wanting to get her medications refilled.  Patient states that she just wanted an appointment to follow up about her medications.    Patient states that she just wanted to get her medications refilled---Zoloft, Seroquel, and a pain medication that she received a few weeks ago at the hospital.  She denies any suicidal ideations and states she has had chronic pain at the site of her colostomy bag that has been ongoing.  She advised that she just wanted to make an appointment with whoever was taking her provider's place at the office.  She was told it would be a Dr. Meredeth Ide.  She also knows she would have to be seen again for any continuing pain so she wanted to make an appointment.  Patient is also advised that if anything gets worse to go to the emergency room or call 911.  Patient verbalized understanding. Answer Assessment - Initial Assessment Questions 1. REASON FOR CALL or QUESTION: "What is your reason for calling today?" or "How can I best help you?" or "What question do you have that I can help answer?"     Patient states that she just wanted to get her medications refilled---Zoloft, Seroquel, and a pain medication that she received a few weeks ago at the hospital.  She  denies any suicidal ideations and states she has had chronic pain at the site of her colostomy bag that has been ongoing.  She advised that she just wanted to make an appointment with whoever was taking her provider's place at the office.  She was told it would be a Dr. Meredeth Ide.  She also knows she would have to be seen again for any continuing pain so she wanted to make an appointment.  Protocols used: Information Only Call - No Triage-A-AH

## 2023-12-11 ENCOUNTER — Telehealth: Payer: Self-pay | Admitting: Critical Care Medicine

## 2023-12-11 ENCOUNTER — Telehealth: Payer: Self-pay

## 2023-12-11 MED ORDER — SERTRALINE HCL 50 MG PO TABS: 50.0000 mg | ORAL_TABLET | Freq: Every day | ORAL | 0 refills | Status: DC

## 2023-12-11 MED ORDER — QUETIAPINE FUMARATE 50 MG PO TABS: 50.0000 mg | ORAL_TABLET | Freq: Two times a day (BID) | ORAL | 0 refills | Status: DC

## 2023-12-11 NOTE — Telephone Encounter (Signed)
 Contacted pt to confim appt!

## 2023-12-11 NOTE — Telephone Encounter (Signed)
 Pharmacy Patient Advocate Encounter   Received notification from CoverMyMeds that prior authorization for QUETIAPINE  is required/requested.   Insurance verification completed.   The patient is insured through [facility] .   Per test claim: PA required; PA submitted to above mentioned insurance via CoverMyMeds Key/confirmation #/[id number] Status is pending

## 2023-12-14 ENCOUNTER — Other Ambulatory Visit: Payer: Self-pay

## 2023-12-14 NOTE — Telephone Encounter (Signed)
 Pharmacy Patient Advocate Encounter  Received notification from [facility] that Prior Authorization for QUETIAPINE  has been APPROVED from 12/11/2023 to 12/10/2024   PA #/Case ID/Reference #: [id number]

## 2023-12-16 ENCOUNTER — Ambulatory Visit: Payer: MEDICAID | Attending: Nurse Practitioner | Admitting: Nurse Practitioner

## 2023-12-16 ENCOUNTER — Encounter: Payer: Self-pay | Admitting: Nurse Practitioner

## 2023-12-16 ENCOUNTER — Other Ambulatory Visit (HOSPITAL_COMMUNITY)
Admission: RE | Admit: 2023-12-16 | Discharge: 2023-12-16 | Disposition: A | Discharge status: Home / Self Care | Payer: MEDICAID | Source: Ambulatory Visit | Attending: Nurse Practitioner | Admitting: Nurse Practitioner

## 2023-12-16 VITALS — BP 122/76 | HR 70 | Resp 18 | Ht 62.0 in | Wt 108.4 lb

## 2023-12-16 DIAGNOSIS — N760 Acute vaginitis: Secondary | ICD-10-CM | POA: Diagnosis not present

## 2023-12-16 DIAGNOSIS — E43 Unspecified severe protein-calorie malnutrition: Secondary | ICD-10-CM

## 2023-12-16 DIAGNOSIS — I10 Essential (primary) hypertension: Secondary | ICD-10-CM | POA: Diagnosis not present

## 2023-12-16 DIAGNOSIS — F3175 Bipolar disorder, in partial remission, most recent episode depressed: Secondary | ICD-10-CM

## 2023-12-16 DIAGNOSIS — G894 Chronic pain syndrome: Secondary | ICD-10-CM

## 2023-12-16 DIAGNOSIS — Z932 Ileostomy status: Secondary | ICD-10-CM

## 2023-12-16 DIAGNOSIS — Q439 Congenital malformation of intestine, unspecified: Secondary | ICD-10-CM

## 2023-12-16 DIAGNOSIS — D6489 Other specified anemias: Secondary | ICD-10-CM

## 2023-12-16 DIAGNOSIS — M160 Bilateral primary osteoarthritis of hip: Secondary | ICD-10-CM

## 2023-12-16 DIAGNOSIS — C20 Malignant neoplasm of rectum: Secondary | ICD-10-CM

## 2023-12-16 DIAGNOSIS — R399 Unspecified symptoms and signs involving the genitourinary system: Secondary | ICD-10-CM | POA: Diagnosis not present

## 2023-12-16 MED ORDER — FLUCONAZOLE 150 MG PO TABS: 150.0000 mg | ORAL_TABLET | ORAL | 0 refills | Status: DC

## 2023-12-16 MED ORDER — SERTRALINE HCL 50 MG PO TABS: 50.0000 mg | ORAL_TABLET | Freq: Every day | ORAL | 1 refills | Status: AC

## 2023-12-16 MED ORDER — ENSURE ENLIVE PO LIQD: 237.0000 mL | Freq: Three times a day (TID) | ORAL | Status: DC

## 2023-12-16 MED ORDER — ALBUTEROL SULFATE HFA 108 (90 BASE) MCG/ACT IN AERS: 2.0000 | INHALATION_SPRAY | Freq: Four times a day (QID) | RESPIRATORY_TRACT | 1 refills | Status: AC | PRN

## 2023-12-16 MED ORDER — QUETIAPINE FUMARATE 50 MG PO TABS: 50.0000 mg | ORAL_TABLET | Freq: Two times a day (BID) | ORAL | 1 refills | Status: AC

## 2023-12-16 MED ORDER — NITROFURANTOIN MONOHYD MACRO 100 MG PO CAPS: 100.0000 mg | ORAL_CAPSULE | Freq: Two times a day (BID) | ORAL | 0 refills | Status: AC

## 2023-12-16 MED ORDER — ACETAMINOPHEN-CODEINE 300-30 MG PO TABS: 1.0000 | ORAL_TABLET | Freq: Three times a day (TID) | ORAL | 0 refills | Status: DC | PRN

## 2023-12-16 NOTE — Progress Notes (Signed)
Still bleeding around colostomy bag site Uti symptoms , itching, discharge and spotting(may be due to depends rubbing against skin.  Muscle relaxer's if possible. Supplies needed to be sent to [facility] medical; depends and colostomy bags.

## 2023-12-16 NOTE — Progress Notes (Signed)
Assessment & Plan:  Sherry Baker was seen today for medical management of chronic issues.  Diagnoses and all orders for this visit:  Primary hypertension -     CMP14+EGFR  Rectal cancer (HCC) -     Ambulatory referral to General Surgery  Acute vaginitis -     fluconazole (DIFLUCAN) 150 MG tablet; Take 1 tablet (150 mg total) by mouth every 3 (three) days. For yeast infection -     Cervicovaginal ancillary only  UTI symptoms -     nitrofurantoin, macrocrystal-monohydrate, (MACROBID) 100 MG capsule; Take 1 capsule (100 mg total) by mouth 2 (two) times daily for 5 days. FOR UTI -     Urinalysis, Complete -     Urine Culture  Bipolar disorder, in partial remission, most recent episode depressed (HCC) -     QUEtiapine (SEROQUEL) 50 MG tablet; Take 1 tablet (50 mg total) by mouth 2 (two) times daily. -     sertraline (ZOLOFT) 50 MG tablet; Take 1 tablet (50 mg total) by mouth daily.  S/P ileostomy (HCC) -     Ambulatory referral to General Surgery  Duodenal anomaly -     Ambulatory referral to Gastroenterology  Protein-calorie malnutrition, severe (HCC) -     feeding supplement (ENSURE ENLIVE / ENSURE PLUS) LIQD; Take 237 mLs by mouth 3 (three) times daily between meals.  Chronic pain syndrome -     Ambulatory referral to Pain Clinic -     Drug Screen 10 W/Conf, Serum -     acetaminophen-codeine (TYLENOL #3) 300-30 MG tablet; Take 1-2 tablets by mouth every 8 (eight) hours as needed for moderate pain (pain score 4-6).  Anemia due to other cause, not classified -     CBC with Differential  Primary osteoarthritis of both hips -     For home use only DME 4 wheeled rolling walker with seat ([id number])  Other orders -     albuterol (VENTOLIN HFA) 108 (90 Base) MCG/ACT inhaler; Inhale 2 puffs into the lungs every 6 (six) hours as needed for wheezing or shortness of breath.    Patient has been counseled on age-appropriate routine health concerns for screening and prevention. These are  reviewed and up-to-date. Referrals have been placed accordingly. Immunizations are up-to-date or declined.    Subjective:   Chief Complaint  Patient presents with   Medical Management of Chronic Issues    Sherry Baker 58 y.o. female presents to office today for follow up to multiple medical conditions.    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 Lost to follow up with pain mgmt. Will place another referral today. Was previously prescribed oxy however she has been instructed today that this is a medication that I would not prescribe for her. She declines tramadol.   She is requesting ostomy supplies and adult incontinence briefs today due to urinary incontinence and present ostomy. Also due to her b/l hip OA she is unable to make it to the bathroom in time.    Experiencing vaginal irritation and urinary frequency with urgency today.    Ostomy has been bleeding and she will need to follow up with general surgery for this> recent CT did not indicate any infection of the site itself. It did however show a duodenal anomaly that needs to be addressed by endoscopy    Review of Systems  Constitutional:  Negative for fever, malaise/fatigue and weight loss.  HENT: Negative.  Negative for nosebleeds.   Eyes: Negative.  Negative for blurred vision, double vision and photophobia.  Respiratory: Negative.  Negative for cough and shortness of breath.   Cardiovascular: Negative.  Negative for chest pain, palpitations and leg swelling.  Gastrointestinal: Negative.  Negative for heartburn, nausea and vomiting.  Genitourinary:        SEE HPI  Musculoskeletal:  Positive for joint pain. Negative for myalgias.       Chronic pain  Neurological: Negative.  Negative for dizziness, focal weakness, seizures and headaches.   Psychiatric/Behavioral: Negative.  Negative for suicidal ideas.     Past Medical History:  Diagnosis Date   Acquired hypothyroidism    Alcohol abuse    Allergy    PCNS swelling   Arthritis    Bipolar 1 disorder (HCC)    Chronic kidney disease    Chronic pain syndrome    Cirrhosis of liver (HCC)    Cocaine abuse (HCC)    COVID-19 virus infection 06/23/2021   Delirium tremens (HCC) 06/22/2021   Depression    Hypertension    Rectal cancer (HCC) 01/21/2017   Suicidal ideation 02/11/2014    Past Surgical History:  Procedure Laterality Date   ABDOMINAL PERINEAL BOWEL RESECTION N/A 06/18/2017   Procedure: ABDOMINAL PERINEAL RESECTION ERAS PATHWAY;  Surgeon: Romie Levee, MD;  Location: [facility];  Service: General;  Laterality: N/A;   BUBBLE STUDY  11/29/2021   Procedure: BUBBLE STUDY;  Surgeon: Thomasene Ripple, DO;  Location: [facility];  Service: Cardiovascular;;   COLON SURGERY     COLONOSCOPY WITH PROPOFOL Left 01/21/2017   Procedure: COLONOSCOPY WITH PROPOFOL;  Surgeon: Kathi Der, MD;  Location: [facility];  Service: Gastroenterology;  Laterality: Left;   Colostomy     FRACTURE SURGERY     MANDIBLE FRACTURE SURGERY     TEE WITHOUT CARDIOVERSION N/A 11/29/2021   Procedure: TRANSESOPHAGEAL ECHOCARDIOGRAM (TEE);  Surgeon: Thomasene Ripple, DO;  Location: [facility];  Service: Cardiovascular;  Laterality: N/A;    Family History  Problem Relation Age of Onset   Cancer Mother 32       Originating in the abdomen, otherwise unknwon   Cancer - Other Maternal Aunt        Throat    Social History Reviewed with no changes to be made today.   Outpatient Medications Prior to Visit  Medication Sig Dispense Refill   amLODipine (NORVASC) 10 MG tablet Take 1 tablet (10 mg total) by mouth daily. 90 tablet 1   carvedilol (COREG) 25 MG tablet Take 1 tablet (25 mg total) by mouth 2 (two) times daily with a meal. 120 tablet 2   pantoprazole (PROTONIX) 40 MG tablet Take 1 tablet (40 mg  total) by mouth daily. 90 tablet 1   sodium bicarbonate 650 MG tablet Take 1 tablet (650 mg total) by mouth daily. 60 tablet 2   valsartan (DIOVAN) 160 MG tablet Take 1 tablet (160 mg total) by mouth daily. 90 tablet 3   potassium chloride (KLOR-CON) 10 MEQ tablet Take 1 tablet (10 mEq total) by mouth daily. (Patient not taking: Reported on 12/16/2023) 30 tablet 0   umeclidinium-vilanterol (ANORO ELLIPTA) 62.5-25 MCG/ACT AEPB Inhale 1 puff into the lungs daily. (Patient not taking: Reported on 12/16/2023) 60 each 2   acetaminophen (TYLENOL) 325 MG tablet Take 2 tablets (650 mg total) by mouth every 6 (six) hours as needed for up to 30 doses for moderate pain (pain  score 4-6) or mild pain (pain score 1-3). (Patient not taking: Reported on 12/16/2023) 30 tablet 0   albuterol (VENTOLIN HFA) 108 (90 Base) MCG/ACT inhaler Inhale 2 puffs into the lungs every 6 (six) hours as needed for wheezing or shortness of breath. (Patient not taking: Reported on 12/16/2023) 18 g 1   feeding supplement (ENSURE ENLIVE / ENSURE PLUS) LIQD Take 237 mLs by mouth 3 (three) times daily between meals. (Patient not taking: Reported on 12/16/2023) 237 mL    oxyCODONE (ROXICODONE) 5 MG immediate release tablet Take 1 tablet (5 mg total) by mouth every 4 (four) hours as needed for severe pain. (Patient not taking: Reported on 12/16/2023) 10 tablet 0   oxyCODONE (ROXICODONE) 5 MG immediate release tablet Take 1 tablet (5 mg total) by mouth every 6 (six) hours as needed for up to 8 doses for severe pain (pain score 7-10). (Patient not taking: Reported on 12/16/2023) 8 tablet 0   QUEtiapine (SEROQUEL) 50 MG tablet Place 1 tablet (50 mg total) into feeding tube 2 (two) times daily. (Patient not taking: Reported on 12/16/2023) 60 tablet 0   sertraline (ZOLOFT) 50 MG tablet Take 1 tablet (50 mg total) by mouth daily. (Patient not taking: Reported on 12/16/2023) 30 tablet 0   No facility-administered medications prior to visit.    Allergies   Allergen Reactions   Penicillins Hives, Swelling and Rash    Tolerated Unasyn, Rocephin  Did it involve swelling of the face/tongue/throat, SOB, or low BP? Yes  Did it involve sudden or severe rash/hives, skin peeling, or any reaction on the inside of your mouth or nose? No  Did you need to seek medical attention at a hospital or doctor's office? No  When did it last happen?  <10 years    If all above answers are "NO", may proceed with cephalosporin use.  Has patient had a PCN reaction causing immediate rash, facial/tongue/throat swelling, SOB or lightheadedness with hypotension: Yes, Has patient had a PCN reaction causing severe rash involving mucus membranes or skin necrosis: No, Has patient had a PCN reaction that required hospitalization No, Has patient had a PCN reaction occurring within the last 10 years: Yes, If all of the above answers are "NO", then may proceed with Cephalosporin use.       Objective:    BP 122/76 (BP Location: Left Arm, Patient Position: Sitting, Cuff Size: Small)   Pulse 70   Resp 18   Ht 5\' 2"  (1.575 m)   Wt 108 lb 6.4 oz (49.2 kg)   SpO2 99%   BMI 19.83 kg/m  Wt Readings from Last 3 Encounters:  12/16/23 108 lb 6.4 oz (49.2 kg)  11/16/23 105 lb (47.6 kg)  10/12/23 105 lb 6.4 oz (47.8 kg)    Physical Exam Vitals and nursing note reviewed.  Constitutional:      Appearance: She is well-developed.  HENT:     Head: Normocephalic and atraumatic.  Cardiovascular:     Rate and Rhythm: Normal rate and regular rhythm.     Heart sounds: Normal heart sounds. No murmur heard.    No friction rub. No gallop.  Pulmonary:     Effort: Pulmonary effort is normal. No tachypnea or respiratory distress.     Breath sounds: Normal breath sounds. No decreased breath sounds, wheezing, rhonchi or rales.  Chest:     Chest wall: No tenderness.  Abdominal:     General: Bowel sounds are normal.     Palpations: Abdomen is soft.  Musculoskeletal:        General:  Normal range of motion.     Cervical back: Normal range of motion.  Skin:    General: Skin is warm and dry.  Neurological:     Mental Status: She is alert and oriented to person, place, and time.     Coordination: Coordination normal.  Psychiatric:        Behavior: Behavior normal. Behavior is cooperative.        Thought Content: Thought content normal.        Judgment: Judgment normal.          Patient has been counseled extensively about nutrition and exercise as well as the importance of adherence with medications and regular follow-up. The patient was given clear instructions to go to ER or return to medical center if symptoms don't improve, worsen or new problems develop. The patient verbalized understanding.   Follow-up: Return in about 3 months (around 03/14/2024).   Claiborne Rigg, FNP-BC [facility] [place], [place] [phone]   12/16/2023, 12:36 PM

## 2023-12-16 NOTE — Patient Instructions (Addendum)
Pain referral  [facility] Ph. # [phone] Address [address] [place], [place] [address].    GENERAL SURGERY CONSULT Herby Abraham, MD, [facility], [place] Colorectal Surgery [facility] O: [phone] F: [phone]

## 2023-12-18 LAB — CERVICOVAGINAL ANCILLARY ONLY
Bacterial Vaginitis (gardnerella): NEGATIVE
Candida Glabrata: NEGATIVE
Candida Vaginitis: NEGATIVE
Chlamydia: NEGATIVE
Comment: NEGATIVE
Comment: NEGATIVE
Comment: NEGATIVE
Comment: NEGATIVE
Comment: NEGATIVE
Comment: NORMAL
Neisseria Gonorrhea: NEGATIVE
Trichomonas: NEGATIVE

## 2023-12-19 LAB — DRUG SCREEN 10 W/CONF, SERUM
Amphetamines, IA: NEGATIVE ng/mL
Barbiturates, IA: NEGATIVE ug/mL
Benzodiazepines, IA: NEGATIVE ng/mL
Cocaine & Metabolite, IA: NEGATIVE ng/mL
Methadone, IA: NEGATIVE ng/mL
Opiates, IA: NEGATIVE ng/mL
Oxycodones, IA: NEGATIVE ng/mL
Phencyclidine, IA: NEGATIVE ng/mL
Propoxyphene, IA: NEGATIVE ng/mL
THC(Marijuana) Metabolite, IA: NEGATIVE ng/mL

## 2023-12-19 LAB — CMP14+EGFR
ALT: 11 [IU]/L (ref 0–32)
AST: 32 [IU]/L (ref 0–40)
Albumin: 4.2 g/dL (ref 3.8–4.9)
Alkaline Phosphatase: 108 [IU]/L (ref 44–121)
BUN/Creatinine Ratio: 13 (ref 9–23)
BUN: 21 mg/dL (ref 6–24)
Bilirubin Total: <0.2 mg/dL (ref 0.0–1.2)
CO2: 12 mmol/L — ABNORMAL LOW (ref 20–29)
Calcium: 9.2 mg/dL (ref 8.7–10.2)
Chloride: 106 mmol/L (ref 96–106)
Creatinine, Ser: 1.65 mg/dL — ABNORMAL HIGH (ref 0.57–1.00)
Globulin, Total: 4.4 g/dL (ref 1.5–4.5)
Glucose: 50 mg/dL — ABNORMAL LOW (ref 70–99)
Potassium: 5.1 mmol/L (ref 3.5–5.2)
Sodium: 138 mmol/L (ref 134–144)
Total Protein: 8.6 g/dL — ABNORMAL HIGH (ref 6.0–8.5)
eGFR: 36 mL/min/{1.73_m2} — ABNORMAL LOW (ref >59)

## 2023-12-19 LAB — URINALYSIS, COMPLETE
Bilirubin, UA: NEGATIVE
Glucose, UA: NEGATIVE
Ketones, UA: NEGATIVE
Nitrite, UA: POSITIVE — AB
Specific Gravity, UA: 1.015 (ref 1.005–1.030)
Urobilinogen, Ur: 0.2 mg/dL (ref 0.2–1.0)
pH, UA: 5.5 (ref 5.0–7.5)

## 2023-12-19 LAB — CBC WITH DIFFERENTIAL/PLATELET
Basophils Absolute: 0 10*3/uL (ref 0.0–0.2)
Basos: 0 %
EOS (ABSOLUTE): 0.2 10*3/uL (ref 0.0–0.4)
Eos: 4 %
Hematocrit: 41.6 % (ref 34.0–46.6)
Hemoglobin: 13.1 g/dL (ref 11.1–15.9)
Immature Grans (Abs): 0 10*3/uL (ref 0.0–0.1)
Immature Granulocytes: 1 %
Lymphocytes Absolute: 1.5 10*3/uL (ref 0.7–3.1)
Lymphs: 30 %
MCH: 27.3 pg (ref 26.6–33.0)
MCHC: 31.5 g/dL (ref 31.5–35.7)
MCV: 87 fL (ref 79–97)
Monocytes Absolute: 0.5 10*3/uL (ref 0.1–0.9)
Monocytes: 10 %
Neutrophils Absolute: 2.9 10*3/uL (ref 1.4–7.0)
Neutrophils: 55 %
Platelets: 283 10*3/uL (ref 150–450)
RBC: 4.79 x10E6/uL (ref 3.77–5.28)
RDW: 18.6 % — ABNORMAL HIGH (ref 11.7–15.4)
WBC: 5.2 10*3/uL (ref 3.4–10.8)

## 2023-12-19 LAB — MICROSCOPIC EXAMINATION
Casts: NONE SEEN /[LPF]
RBC, Urine: >30 /[HPF] — AB (ref 0–2)
WBC, UA: >30 /[HPF] — AB (ref 0–5)

## 2023-12-20 ENCOUNTER — Other Ambulatory Visit: Payer: Self-pay | Admitting: Nurse Practitioner

## 2023-12-23 LAB — URINE CULTURE

## 2023-12-23 NOTE — Telephone Encounter (Unsigned)
Copied from CRM [id number]. Topic: Clinical - Medication Question >> Dec 23, 2023  3:36 PM Gildardo Pounds wrote: Reason for CRM: Patient inquiring if prescriptions for depends and colostomy bags supplies have been sent to [facility]. Callback number [phone]

## 2023-12-24 ENCOUNTER — Telehealth: Payer: Self-pay

## 2023-12-24 NOTE — Telephone Encounter (Signed)
I spoke to Sherry Baker/ [facility] : [phone] regarding the orders. She explained that Dr Delford Field had submitted the orders for ostomy and incontinence supplies.  The ostomy order expired 11/2023 and the incontinence products order expires 03/2024.  I asked that she email me the orders/ documents that need to be signed and she said she will send that request to their documents department.Marland Kitchen

## 2023-12-24 NOTE — Telephone Encounter (Signed)
Please follow up with Erskine Squibb. We had a discussion about this already. I don't have any orders from [facility]

## 2023-12-24 NOTE — Telephone Encounter (Signed)
Call placed to patient . Patient voiced that she continues to having itching and burning in vaginal area. Confirmed that patient is taking fluconazole 150 MG tablet and nitrofurantoin (macrocrystal-monohydrate) 100 MG Capsule as instructed. Advised patient that she can use OTC vaginal itch relief cream. This came help with s/s. Advised to continue with medications that was given to her by her provider. If s/s worsen or does not improve within the next week please let us know. Patient was agreeable and voiced understanding.

## 2023-12-24 NOTE — Telephone Encounter (Signed)
Thanks so much Jane

## 2023-12-24 NOTE — Telephone Encounter (Signed)
Copied from CRM [id number]. Topic: Clinical - Lab/Test Results >> Dec 24, 2023 10:20 AM Sherry Baker wrote: Reason for CRM: Patient returning call about lab results. Provided patient with results per notes left in chart. Asked pt if she was seeing [facility] & pt stated no. I provided her with their phone [phone]) per note left from Dr. Meredeth Ide. Also advised pt to check with pharmacy for antibiotics prescription. Patient also wanted to let Dr. Meredeth Ide know that she has been taking the medicine she was previously given but she is still having itching & burning in the vaginal area. Please give patient a call back to advise any other info. CB #: [phone].

## 2023-12-28 ENCOUNTER — Ambulatory Visit: Payer: Self-pay | Admitting: Critical Care Medicine

## 2023-12-28 NOTE — Telephone Encounter (Signed)
 Chief Complaint: UTI Symptoms: Itching, yeast infection, blood in urine, burning with urination, nausea Frequency: Ongoing Pertinent Negatives: Patient denies fever (doesn't own a thermometer) Disposition: [] ED /[] Urgent Care (no appt availability in office) / [] Appointment(In office/virtual)/ []  [facility] [facility]/ [] Home Care/ [] Refused Recommended Disposition /[] [place] [facility]/ [x]  Follow-up with PCP Additional Notes: Spoke with pt's cousin, Crystal. Pt was seen in office on 12/16/2023 and started on an antibiotic (nitrofurantoin, macrocrystal-monohydrate, (MACROBID) 100 MG capsule). Pt was called on 2/21 with urine culture results and let know that another antibiotic would be called into pharmacy, but the pharmacy has not received this yet. Pt cousin calling to follow up. Pt cousin is not sure if pt might need a different antibiotic than the previous one as Macrobid made pt feel nauseous. Pt cousin notes the yeast infection has gotten worse. Pt was prescribed fluconazole 150 mg every 3 days. Pt cousin states the pt has an appointment with the kidney doctor on Thursday. Pt cousin does note that the pt is feeling a little better today. This RN educated pt cousin on home care, new-worsening symptoms, when to call back/seek emergent care. Pt cousin verbalized understanding and agrees to plan.   **Pt cousin, Synetta Shadow, would like to be contacted with response [phone]    Copied from CRM [id number]. Topic: Clinical - Medication Question >> Dec 28, 2023  5:13 PM Everette C wrote: Reason for CRM: The patient's cousin Aggie Cosier has called to follow up on a previous request for an antibiotic prescription for the patient   The patient, per their cousin, has ongoing issues related to the antibiotics that they have been previously prescribed and was told that a new prescription was being submitted for the patient.   The patient's cousin would like to discuss further Reason for Disposition   [1] Female AND [2] before menopause AND [3] could be normal menstrual bleeding  [1] Taking antibiotic > 72 hours (3 days) AND [2] symptoms (other than fever) not improved  Answer Assessment - Initial Assessment Questions 1. INFECTION: "What infection is the antibiotic being given for?"     UTI 2. ANTIBIOTIC: "What antibiotic are you taking" "How many times per day?"    nitrofurantoin, macrocrystal-monohydrate, (MACROBID) 100 MG capsule; Take 1 capsule (100 mg total) by mouth 2 (two) times daily for 5 days. 3. DURATION: "When was the antibiotic started?"     12/16/2023 4. MAIN CONCERN OR SYMPTOM:  "What is your main concern right now?"     Pt was called on 2/21 and told another antibiotic would be ordered. Pt has been calling pharmacy but no antibiotic has been called in 5. BETTER-SAME-WORSE: "Are you getting better, staying the same, or getting worse compared to when you first started the antibiotics?" If getting worse, ask: "In what way?"      Pt feeling better today but still has UTI and yeast infection 6. FEVER: "Do you have a fever?" If Yes, ask: "What is your temperature, how was it measured, and when did it start?"     Pt does not own a thermometer  Protocols used: Infection on Antibiotic Follow-up Call-A-AH, Urine - Blood In-A-AH

## 2023-12-29 ENCOUNTER — Other Ambulatory Visit: Payer: Self-pay | Admitting: Nurse Practitioner

## 2023-12-29 ENCOUNTER — Telehealth: Payer: Self-pay | Admitting: Nurse Practitioner

## 2023-12-29 ENCOUNTER — Encounter: Payer: Self-pay | Admitting: Nurse Practitioner

## 2023-12-29 DIAGNOSIS — N3090 Cystitis, unspecified without hematuria: Secondary | ICD-10-CM

## 2023-12-29 DIAGNOSIS — E43 Unspecified severe protein-calorie malnutrition: Secondary | ICD-10-CM

## 2023-12-29 MED ORDER — ONDANSETRON HCL 4 MG PO TABS: 4.0000 mg | ORAL_TABLET | Freq: Three times a day (TID) | ORAL | 0 refills | Status: DC | PRN

## 2023-12-29 MED ORDER — NITROFURANTOIN MONOHYD MACRO 100 MG PO CAPS: 100.0000 mg | ORAL_CAPSULE | Freq: Two times a day (BID) | ORAL | 0 refills | Status: DC

## 2023-12-29 MED ORDER — ENSURE ENLIVE PO LIQD: 237.0000 mL | Freq: Three times a day (TID) | ORAL | 6 refills | Status: DC

## 2023-12-29 MED ORDER — NITROFURANTOIN MONOHYD MACRO 100 MG PO CAPS
100.0000 mg | ORAL_CAPSULE | Freq: Two times a day (BID) | ORAL | 0 refills | Status: DC
Start: 2023-12-29 — End: 2024-01-12

## 2023-12-29 NOTE — Telephone Encounter (Signed)
 Did they even pick up the antibiotic that was sent initially.

## 2023-12-29 NOTE — Telephone Encounter (Signed)
 Provider has returned call.

## 2023-12-29 NOTE — Telephone Encounter (Signed)
 Copied from CRM [id number]. Topic: General - Other >> Dec 29, 2023 12:27 PM Phill Myron wrote: Sherry Baker returning [facility].

## 2023-12-29 NOTE — Telephone Encounter (Signed)
 LVM with Crystal [phone]

## 2023-12-31 ENCOUNTER — Emergency Department (HOSPITAL_COMMUNITY): Payer: MEDICAID

## 2023-12-31 ENCOUNTER — Encounter: Payer: Self-pay | Admitting: Critical Care Medicine

## 2023-12-31 ENCOUNTER — Encounter: Payer: Self-pay | Admitting: Nurse Practitioner

## 2023-12-31 ENCOUNTER — Inpatient Hospital Stay (HOSPITAL_COMMUNITY)
Admission: EM | Admit: 2023-12-31 | Discharge: 2024-01-12 | DRG: 682 | Disposition: A | Discharge status: Skilled Nursing Facility | Payer: MEDICAID | Attending: Internal Medicine | Admitting: Internal Medicine

## 2023-12-31 ENCOUNTER — Other Ambulatory Visit: Payer: Self-pay

## 2023-12-31 ENCOUNTER — Ambulatory Visit: Payer: Self-pay | Admitting: Critical Care Medicine

## 2023-12-31 DIAGNOSIS — Z880 Allergy status to penicillin: Secondary | ICD-10-CM

## 2023-12-31 DIAGNOSIS — I129 Hypertensive chronic kidney disease with stage 1 through stage 4 chronic kidney disease, or unspecified chronic kidney disease: Secondary | ICD-10-CM | POA: Diagnosis present

## 2023-12-31 DIAGNOSIS — N322 Vesical fistula, not elsewhere classified: Secondary | ICD-10-CM | POA: Diagnosis present

## 2023-12-31 DIAGNOSIS — Z8616 Personal history of COVID-19: Secondary | ICD-10-CM

## 2023-12-31 DIAGNOSIS — E039 Hypothyroidism, unspecified: Secondary | ICD-10-CM | POA: Diagnosis present

## 2023-12-31 DIAGNOSIS — J439 Emphysema, unspecified: Secondary | ICD-10-CM | POA: Diagnosis present

## 2023-12-31 DIAGNOSIS — K219 Gastro-esophageal reflux disease without esophagitis: Secondary | ICD-10-CM | POA: Diagnosis present

## 2023-12-31 DIAGNOSIS — F319 Bipolar disorder, unspecified: Secondary | ICD-10-CM | POA: Diagnosis present

## 2023-12-31 DIAGNOSIS — I959 Hypotension, unspecified: Secondary | ICD-10-CM | POA: Diagnosis present

## 2023-12-31 DIAGNOSIS — B961 Klebsiella pneumoniae [K. pneumoniae] as the cause of diseases classified elsewhere: Secondary | ICD-10-CM | POA: Diagnosis present

## 2023-12-31 DIAGNOSIS — Z23 Encounter for immunization: Secondary | ICD-10-CM

## 2023-12-31 DIAGNOSIS — R001 Bradycardia, unspecified: Secondary | ICD-10-CM | POA: Diagnosis not present

## 2023-12-31 DIAGNOSIS — F17210 Nicotine dependence, cigarettes, uncomplicated: Secondary | ICD-10-CM | POA: Diagnosis present

## 2023-12-31 DIAGNOSIS — N3020 Other chronic cystitis without hematuria: Secondary | ICD-10-CM | POA: Diagnosis present

## 2023-12-31 DIAGNOSIS — L89154 Pressure ulcer of sacral region, stage 4: Secondary | ICD-10-CM | POA: Diagnosis present

## 2023-12-31 DIAGNOSIS — N390 Urinary tract infection, site not specified: Principal | ICD-10-CM | POA: Insufficient documentation

## 2023-12-31 DIAGNOSIS — I10 Essential (primary) hypertension: Secondary | ICD-10-CM | POA: Diagnosis present

## 2023-12-31 DIAGNOSIS — L8990 Pressure ulcer of unspecified site, unspecified stage: Secondary | ICD-10-CM | POA: Diagnosis present

## 2023-12-31 DIAGNOSIS — Z87440 Personal history of urinary (tract) infections: Secondary | ICD-10-CM

## 2023-12-31 DIAGNOSIS — Z933 Colostomy status: Secondary | ICD-10-CM

## 2023-12-31 DIAGNOSIS — Z1612 Extended spectrum beta lactamase (ESBL) resistance: Secondary | ICD-10-CM | POA: Diagnosis present

## 2023-12-31 DIAGNOSIS — Z85048 Personal history of other malignant neoplasm of rectum, rectosigmoid junction, and anus: Secondary | ICD-10-CM

## 2023-12-31 DIAGNOSIS — R112 Nausea with vomiting, unspecified: Secondary | ICD-10-CM | POA: Insufficient documentation

## 2023-12-31 DIAGNOSIS — N183 Chronic kidney disease, stage 3 unspecified: Secondary | ICD-10-CM | POA: Diagnosis present

## 2023-12-31 DIAGNOSIS — G894 Chronic pain syndrome: Secondary | ICD-10-CM | POA: Diagnosis present

## 2023-12-31 DIAGNOSIS — E860 Dehydration: Secondary | ICD-10-CM | POA: Diagnosis present

## 2023-12-31 DIAGNOSIS — K7030 Alcoholic cirrhosis of liver without ascites: Secondary | ICD-10-CM | POA: Diagnosis present

## 2023-12-31 DIAGNOSIS — Z79899 Other long term (current) drug therapy: Secondary | ICD-10-CM

## 2023-12-31 DIAGNOSIS — N179 Acute kidney failure, unspecified: Principal | ICD-10-CM | POA: Diagnosis present

## 2023-12-31 LAB — URINALYSIS, ROUTINE W REFLEX MICROSCOPIC
Bilirubin Urine: NEGATIVE
Glucose, UA: NEGATIVE mg/dL
Ketones, ur: NEGATIVE mg/dL
Nitrite: POSITIVE — AB
Protein, ur: 100 mg/dL — AB
RBC / HPF: >50 RBC/hpf (ref 0–5)
Specific Gravity, Urine: 1.018 (ref 1.005–1.030)
WBC, UA: >50 WBC/hpf (ref 0–5)
pH: 5 (ref 5.0–8.0)

## 2023-12-31 LAB — COMPREHENSIVE METABOLIC PANEL
ALT: 10 U/L (ref 0–44)
AST: 15 U/L (ref 15–41)
Albumin: 2.8 g/dL — ABNORMAL LOW (ref 3.5–5.0)
Alkaline Phosphatase: 67 U/L (ref 38–126)
Anion gap: 14 (ref 5–15)
BUN: 30 mg/dL — ABNORMAL HIGH (ref 6–20)
CO2: 15 mmol/L — ABNORMAL LOW (ref 22–32)
Calcium: 8.7 mg/dL — ABNORMAL LOW (ref 8.9–10.3)
Chloride: 104 mmol/L (ref 98–111)
Creatinine, Ser: 2.22 mg/dL — ABNORMAL HIGH (ref 0.44–1.00)
GFR, Estimated: 25 mL/min — ABNORMAL LOW (ref >60)
Glucose, Bld: 87 mg/dL (ref 70–99)
Potassium: 4.5 mmol/L (ref 3.5–5.1)
Sodium: 133 mmol/L — ABNORMAL LOW (ref 135–145)
Total Bilirubin: 0.3 mg/dL (ref 0.0–1.2)
Total Protein: 7.8 g/dL (ref 6.5–8.1)

## 2023-12-31 LAB — CBC
HCT: 42.9 % (ref 36.0–46.0)
Hemoglobin: 13.4 g/dL (ref 12.0–15.0)
MCH: 27.6 pg (ref 26.0–34.0)
MCHC: 31.2 g/dL (ref 30.0–36.0)
MCV: 88.3 fL (ref 80.0–100.0)
Platelets: 242 10*3/uL (ref 150–400)
RBC: 4.86 MIL/uL (ref 3.87–5.11)
RDW: 18.2 % — ABNORMAL HIGH (ref 11.5–15.5)
WBC: 12.2 10*3/uL — ABNORMAL HIGH (ref 4.0–10.5)
nRBC: 0 % (ref 0.0–0.2)

## 2023-12-31 LAB — LIPASE, BLOOD: Lipase: 20 U/L (ref 11–51)

## 2023-12-31 MED ORDER — ONDANSETRON HCL 4 MG/2ML IJ SOLN
4.0000 mg | Freq: Once | INTRAMUSCULAR | Status: AC
  Administered 2023-12-31: 4 mg via INTRAVENOUS
  Filled 2023-12-31: qty 2

## 2023-12-31 MED ORDER — ACETAMINOPHEN 500 MG PO TABS
1000.0000 mg | ORAL_TABLET | ORAL | Status: AC
  Administered 2023-12-31: 1000 mg via ORAL
  Filled 2023-12-31: qty 2

## 2023-12-31 MED ORDER — HYDROMORPHONE HCL 1 MG/ML IJ SOLN: 0.5000 mg | Freq: Once | INTRAMUSCULAR | Status: DC

## 2023-12-31 MED ORDER — SODIUM CHLORIDE 0.9 % IV BOLUS
1000.0000 mL | Freq: Once | INTRAVENOUS | Status: AC
  Administered 2023-12-31: 1000 mL via INTRAVENOUS

## 2023-12-31 NOTE — ED Triage Notes (Signed)
 Patient reports intermittent / chronic generalized abdominal pain with occasional emesis and jaundice for several weeks .

## 2023-12-31 NOTE — ED Provider Triage Note (Signed)
 Emergency Medicine Provider Triage Evaluation Note  Sherry Baker , a 58 y.o. female  was evaluated in triage.  Pt complains of abdominal pain, nausea.  States she was placed on antibiotics for UTI 12 days ago that is made her nauseated with subsequent vomiting.  Reports decreased p.o. intake since taking antibiotic.  Also reports worsening of abdominal pain.  Review of Systems  Positive: See above Negative:   Physical Exam  BP (!) 85/58 (BP Location: Left Arm)   Pulse 96   Temp (!) 97.5 F (36.4 C)   Resp (!) 22   SpO2 94%  Gen:   Awake, no distress   Resp:  Normal effort  MSK:   Moves extremities without difficulty  Other:    Medical Decision Making  Medically screening exam initiated at 7:31 PM.  Appropriate orders placed.  Sherry Baker was informed that the remainder of the evaluation will be completed by another provider, this initial triage assessment does not replace that evaluation, and the importance of remaining in the ED until their evaluation is complete.     Peter Garter, [place] 12/31/23 [phone]

## 2023-12-31 NOTE — ED Notes (Signed)
 Patient transported to CT

## 2023-12-31 NOTE — Telephone Encounter (Addendum)
 Info only: Public house manager on behalf of pt with concerns of allergic reaction to the medication Nitrofurantoin. Pt has been on the medication numerous times and has vomiting episodes each time. Crystal was looking up contraindications and noticed it shouldn't be taken if allergic to PCN and if having  cirrhosis of the liver. The pt has both. Crystal feels this could be the cause of the extreme vomiting and wants this addressed as soon as possible. Please advice.  Crystal can be reached at: [phone]          Copied from CRM [id number]. Topic: Clinical - Red Word Triage >> Dec 31, 2023  9:37 AM Payton Doughty wrote: Red Word that prompted transfer to Nurse Triage: med reaction Crystal bennet (caregiver) Reason for Disposition  Health Information question, no triage required and triager able to answer question  Answer Assessment - Initial Assessment Questions 1. REASON FOR CALL or QUESTION: "What is your reason for calling today?" or "How can I best help you?" or "What question do you have that I can help answer?"     Caregiver Crystal Wardell Heath calling with concerns of allergic reaction in antibiotic.  Protocols used: Information Only Call - No Triage-A-AH

## 2023-12-31 NOTE — ED Provider Notes (Signed)
 11:28 PM Assumed care from Dr. Eloise Harman, please see their note for full history, physical and decision making until this point. In brief this is a 58 y.o. year old female who presented to the ED tonight with Abdominal Pain (Hypotensive)     H/o cancer s/p ostomy, recnet UTI not responsive to macrobid (Klebsiella), here with abdominal pain, pending ct read and UA for likely admit for failing outpatient.   CT ok. Failed outpatient UTI abx, needs wound care, d/w TRH for admit for IV antibiotics 2/2 allergies and h/o ESBL. Cultures added on.    Labs, studies and imaging reviewed by myself and considered in medical decision making if ordered. Imaging interpreted by radiology.  Labs Reviewed  COMPREHENSIVE METABOLIC PANEL - Abnormal; Notable for the following components:      Result Value   Sodium 133 (*)    CO2 15 (*)    BUN 30 (*)    Creatinine, Ser 2.22 (*)    Calcium 8.7 (*)    Albumin 2.8 (*)    GFR, Estimated 25 (*)    All other components within normal limits  CBC - Abnormal; Notable for the following components:   WBC 12.2 (*)    RDW 18.2 (*)    All other components within normal limits  LIPASE, BLOOD  URINALYSIS, ROUTINE W REFLEX MICROSCOPIC    CT ABDOMEN PELVIS WO CONTRAST    (Results Pending)    No follow-ups on file.    Roquel Burgin, Barbara Cower, MD 01/02/24 [phone]

## 2023-12-31 NOTE — ED Provider Notes (Signed)
 [place] EMERGENCY DEPARTMENT AT [facility] Provider Note   CSN: [id number] Arrival date & time: 12/31/23  1847     History  Chief Complaint  Patient presents with   Abdominal Pain    Hypotensive    Sherry Baker is a 58 y.o. female.  58 year old female with a history of rectal cancer status post resection in remission since 2018, cirrhosis, alcohol use, polysubstance abuse, ESBL UTI, and CKD who presents to the emergency department with dysuria, abdominal pain, nausea and vomiting.  For the past few weeks patient has been dealing with a UTI.  Has had 10 days of Macrobid for dysuria.  Reports that in that time has been having fevers as well as nausea and vomiting.  Has had some loose stools from her ostomy.  No blood.  Has had cramping abdominal pain 8/10 in severity.  Worsened with movement.  Says that it is around her "colon".       Home Medications Prior to Admission medications   Medication Sig Start Date End Date Taking? Authorizing Provider  acetaminophen-codeine (TYLENOL #3) 300-30 MG tablet Take 1-2 tablets by mouth every 8 (eight) hours as needed for moderate pain (pain score 4-6). 12/16/23  Yes Claiborne Rigg, NP  albuterol (VENTOLIN HFA) 108 (90 Base) MCG/ACT inhaler Inhale 2 puffs into the lungs every 6 (six) hours as needed for wheezing or shortness of breath. 12/16/23  Yes Claiborne Rigg, NP  amLODipine (NORVASC) 10 MG tablet Take 1 tablet (10 mg total) by mouth daily. 10/13/23  Yes Claiborne Rigg, NP  carvedilol (COREG) 25 MG tablet Take 1 tablet (25 mg total) by mouth 2 (two) times daily with a meal. 02/10/23  Yes Storm Frisk, MD  feeding supplement (ENSURE ENLIVE / ENSURE PLUS) LIQD Take 237 mLs by mouth 3 (three) times daily between meals. 12/29/23  Yes Claiborne Rigg, NP  ondansetron (ZOFRAN) 4 MG tablet Take 1 tablet (4 mg total) by mouth every 8 (eight) hours as needed for nausea or vomiting. 12/29/23  Yes Claiborne Rigg, NP  pantoprazole  (PROTONIX) 40 MG tablet Take 1 tablet (40 mg total) by mouth daily. 10/12/23  Yes Claiborne Rigg, NP  potassium chloride (KLOR-CON) 10 MEQ tablet Take 1 tablet (10 mEq total) by mouth daily. 11/16/23 12/31/24 Yes Trifan, Kermit Balo, MD  QUEtiapine (SEROQUEL) 50 MG tablet Take 1 tablet (50 mg total) by mouth 2 (two) times daily. 12/16/23  Yes Claiborne Rigg, NP  sertraline (ZOLOFT) 50 MG tablet Take 1 tablet (50 mg total) by mouth daily. 12/16/23  Yes Claiborne Rigg, NP  umeclidinium-vilanterol (ANORO ELLIPTA) 62.5-25 MCG/ACT AEPB Inhale 1 puff into the lungs daily. Patient taking differently: Inhale 1 puff into the lungs daily as needed (for shortness of breath). 02/10/23  Yes Storm Frisk, MD  fluconazole (DIFLUCAN) 150 MG tablet Take 1 tablet (150 mg total) by mouth every 3 (three) days. For yeast infection Patient not taking: Reported on 01/01/2024 12/16/23   Claiborne Rigg, NP  nitrofurantoin, macrocrystal-monohydrate, (MACROBID) 100 MG capsule Take 1 capsule (100 mg total) by mouth 2 (two) times daily for 5 days. Patient not taking: Reported on 01/01/2024 12/29/23 01/03/24  Claiborne Rigg, NP  sodium bicarbonate 650 MG tablet Take 1 tablet (650 mg total) by mouth daily. Patient not taking: Reported on 01/01/2024 02/10/23   Storm Frisk, MD  valsartan (DIOVAN) 160 MG tablet Take 1 tablet (160 mg total) by mouth daily. Patient  not taking: Reported on 01/01/2024 02/10/23   Storm Frisk, MD  ziprasidone (GEODON) 40 MG capsule Take 1 capsule (40 mg total) by mouth 2 (two) times daily with a meal. 11/06/11 01/09/12  Mancel Bale, MD      Allergies    Penicillins    Review of Systems   Review of Systems  Physical Exam Updated Vital Signs BP 101/63 (BP Location: Right Arm)   Pulse 65   Temp 98.6 F (37 C) (Oral)   Resp 17   Ht 5\' 2"  (1.575 m)   Wt 51.1 kg   SpO2 92%   BMI 20.60 kg/m  Physical Exam Vitals and nursing note reviewed.  Constitutional:      General: She is not in  acute distress.    Appearance: She is well-developed.  HENT:     Head: Normocephalic and atraumatic.     Right Ear: External ear normal.     Left Ear: External ear normal.     Nose: Nose normal.  Eyes:     Extraocular Movements: Extraocular movements intact.     Conjunctiva/sclera: Conjunctivae normal.     Pupils: Pupils are equal, round, and reactive to light.  Pulmonary:     Effort: Pulmonary effort is normal. No respiratory distress.  Abdominal:     General: Abdomen is flat. There is no distension.     Palpations: Abdomen is soft. There is no mass.     Tenderness: There is abdominal tenderness. There is no guarding.     Comments: Ostomy with brown stool.  Musculoskeletal:     Cervical back: Normal range of motion and neck supple.  Skin:    General: Skin is warm and dry.  Neurological:     Mental Status: She is alert and oriented to person, place, and time. Mental status is at baseline.  Psychiatric:        Mood and Affect: Mood normal.     ED Results / Procedures / Treatments   Labs (all labs ordered are listed, but only abnormal results are displayed) Labs Reviewed  URINE CULTURE - Abnormal; Notable for the following components:      Result Value   Culture   (*)    Value: >=100,000 COLONIES/mL KLEBSIELLA PNEUMONIAE CONFIRMATION OF SUSCEPTIBILITIES IN PROGRESS Performed at [facility], [address] [place], [place] [address]    All other components within normal limits  COMPREHENSIVE METABOLIC PANEL - Abnormal; Notable for the following components:   Sodium 133 (*)    CO2 15 (*)    BUN 30 (*)    Creatinine, Ser 2.22 (*)    Calcium 8.7 (*)    Albumin 2.8 (*)    GFR, Estimated 25 (*)    All other components within normal limits  CBC - Abnormal; Notable for the following components:   WBC 12.2 (*)    RDW 18.2 (*)    All other components within normal limits  URINALYSIS, ROUTINE W REFLEX MICROSCOPIC - Abnormal; Notable for the following components:    APPearance TURBID (*)    Hgb urine dipstick LARGE (*)    Protein, ur 100 (*)    Nitrite POSITIVE (*)    Leukocytes,Ua MODERATE (*)    Bacteria, UA MANY (*)    All other components within normal limits  COMPREHENSIVE METABOLIC PANEL - Abnormal; Notable for the following components:   Sodium 134 (*)    CO2 15 (*)    Glucose, Bld 108 (*)  BUN 32 (*)    Creatinine, Ser 2.17 (*)    Calcium 7.9 (*)    Total Protein 5.8 (*)    Albumin 2.1 (*)    AST 10 (*)    GFR, Estimated 26 (*)    All other components within normal limits  CBC WITH DIFFERENTIAL/PLATELET - Abnormal; Notable for the following components:   WBC 11.2 (*)    Hemoglobin 10.9 (*)    HCT 33.9 (*)    RDW 18.0 (*)    Neutro Abs 8.3 (*)    Eosinophils Absolute 1.8 (*)    Abs Immature Granulocytes 0.13 (*)    All other components within normal limits  C-REACTIVE PROTEIN - Abnormal; Notable for the following components:   CRP 8.2 (*)    All other components within normal limits  CBC - Abnormal; Notable for the following components:   Hemoglobin 11.0 (*)    HCT 34.4 (*)    RDW 18.3 (*)    All other components within normal limits  BASIC METABOLIC PANEL - Abnormal; Notable for the following components:   CO2 18 (*)    Glucose, Bld 112 (*)    BUN 32 (*)    Creatinine, Ser 1.91 (*)    GFR, Estimated 30 (*)    All other components within normal limits  C-REACTIVE PROTEIN - Abnormal; Notable for the following components:   CRP 3.1 (*)    All other components within normal limits  CBC WITH DIFFERENTIAL/PLATELET - Abnormal; Notable for the following components:   RBC 3.85 (*)    Hemoglobin 10.6 (*)    HCT 33.3 (*)    RDW 18.5 (*)    Eosinophils Absolute 2.8 (*)    All other components within normal limits  BRAIN NATRIURETIC PEPTIDE - Abnormal; Notable for the following components:   B Natriuretic Peptide 308.7 (*)    All other components within normal limits  BASIC METABOLIC PANEL - Abnormal; Notable for the  following components:   CO2 20 (*)    Glucose, Bld 110 (*)    BUN 33 (*)    Creatinine, Ser 1.75 (*)    GFR, Estimated 34 (*)    All other components within normal limits  LIPASE, BLOOD  HIV ANTIBODY (ROUTINE TESTING W REFLEX)  PHOSPHORUS  MAGNESIUM  PROCALCITONIN  PROCALCITONIN  MAGNESIUM  TSH  T4, FREE    EKG EKG Interpretation Date/Time:  Thursday December 31 2023 21:49:28 EST Ventricular Rate:  90 PR Interval:  126 QRS Duration:  92 QT Interval:  371 QTC Calculation: 447 R Axis:   82  Text Interpretation: Sinus rhythm Ventricular premature complex Interpretation limited secondary to artifact Confirmed by Marily Memos [phone]) on 01/01/2024 12:20:57 AM  Radiology US RENAL Result Date: 01/02/2024 CLINICAL DATA:  Acute kidney injury EXAM: RENAL / URINARY TRACT ULTRASOUND COMPLETE COMPARISON:  CT 12/31/2023 FINDINGS: Right Kidney: Renal measurements: 8.5 x 4 x 4 cm = volume: 69.5 mL. Lobulated contour with areas of cortical scarring and mild atrophy. Cortex slightly echogenic. No mass or hydronephrosis Left Kidney: Renal measurements: 12.1 x 5.6 x 4.2 cm = volume: 147.7 mL. Echogenicity within normal limits. No mass or hydronephrosis visualized. Bladder: Appears normal for degree of bladder distention. Other: None. IMPRESSION: 1. Slightly atrophic right kidney with cortical scarring but no hydronephrosis. Mildly echogenic right renal cortex consistent with medical renal disease. 2. Normal left kidney Electronically Signed   By: Jasmine Pang M.D.   On: 01/02/2024 15:41    Procedures Procedures  Medications Ordered in ED Medications  meropenem (MERREM) 500 mg in sodium chloride 0.9 % 100 mL IVPB (500 mg Intravenous New Bag/Given 01/03/24 0925)  amLODipine (NORVASC) tablet 10 mg (10 mg Oral Given 01/03/24 0925)  QUEtiapine (SEROQUEL) tablet 50 mg (50 mg Oral Given 01/03/24 0925)  sertraline (ZOLOFT) tablet 50 mg (50 mg Oral Given 01/03/24 0925)  pantoprazole (PROTONIX) EC tablet 40  mg (40 mg Oral Given 01/03/24 0925)  feeding supplement (ENSURE ENLIVE / ENSURE PLUS) liquid 237 mL (237 mLs Oral Given 01/03/24 1105)  umeclidinium-vilanterol (ANORO ELLIPTA) 62.5-25 MCG/ACT 1 puff (1 puff Inhalation Given 01/03/24 0757)  albuterol (PROVENTIL) (2.5 MG/3ML) 0.083% nebulizer solution 2.5 mg (has no administration in time range)  enoxaparin (LOVENOX) injection 30 mg (30 mg Subcutaneous Given 01/02/24 1405)  ondansetron (ZOFRAN) injection 4 mg (4 mg Intravenous Given 01/02/24 1824)  acetaminophen (TYLENOL) tablet 650 mg (650 mg Oral Given 01/01/24 2025)  oxyCODONE (Oxy IR/ROXICODONE) immediate release tablet 5 mg (5 mg Oral Given 01/03/24 0929)  hydrALAZINE (APRESOLINE) tablet 100 mg (has no administration in time range)  lactated ringers infusion ( Intravenous New Bag/Given 01/03/24 1212)  sodium chloride 0.9 % bolus 1,000 mL (0 mLs Intravenous Stopped 01/01/24 0040)  ondansetron (ZOFRAN) injection 4 mg (4 mg Intravenous Given 12/31/23 2322)  acetaminophen (TYLENOL) tablet 1,000 mg (1,000 mg Oral Given 12/31/23 2218)  HYDROmorphone (DILAUDID) injection 0.2 mg (0.2 mg Intravenous Given 01/01/24 2300)  magnesium sulfate IVPB 2 g 50 mL (0 g Intravenous Stopped 01/02/24 1208)    ED Course/ Medical Decision Making/ A&P Clinical Course as of 01/03/24 1335  Thu Dec 31, 2023  2104 Creatinine(!): 2.22 Baseline 1.6 [RP]  2329 Signed out to Dr Clayborne Dana [RP]    Clinical Course User Index [RP] Rondel Baton, MD                                 Medical Decision Making Amount and/or Complexity of Data Reviewed Labs: ordered. Decision-making details documented in ED Course. Radiology: ordered.  Risk OTC drugs. Prescription drug management. Decision regarding hospitalization.   Sherry Baker is a 58 y.o. female with comorbidities that complicate the patient evaluation including  rectal cancer status post resection in remission since 2018, cirrhosis, alcohol use, polysubstance abuse, ESBL UTI, and  CKD who presents to the emergency department with dysuria, abdominal pain, nausea and vomiting.    Initial Ddx:  UTI, pyelonephritis, colitis, SBP, gastroenteritis, bowel obstruction  MDM/Course:  Patient presents to the emergency department with dysuria, abdominal pain, nausea and vomiting.  Is currently being treated with Macrobid for UTI.  On exam does have some tenderness to palpation.  Ostomy appears to have some brown stool.  No significant distention or ascites noted that would suggest a bowel obstruction or SBP.  However, with her extensive surgical history will obtain CT scan.  Suspect that she may have a UTI and may require admission for this.  Upon re-evaluation was stable.  Signed out to the oncoming physician awaiting imaging and lab results.  This patient presents to the ED for concern of complaints listed in HPI, this involves an extensive number of treatment options, and is a complaint that carries with it a high risk of complications and morbidity. Disposition including potential need for admission considered.   Dispo: Pending remainder of workup  Records reviewed Outpatient Clinic Notes The following labs were independently interpreted: Urinalysis and show urinary tract infection I  personally reviewed and interpreted the pt's EKG: see above for interpretation  I have reviewed the patients home medications and made adjustments as needed  Portions of this note were generated with Dragon dictation software. Dictation errors may occur despite best attempts at proofreading.     Final Clinical Impression(s) / ED Diagnoses Final diagnoses:  Urinary tract infection without hematuria, site unspecified  AKI (acute kidney injury) [facility])    Rx / DC Orders ED Discharge Orders     None         Rondel Baton, MD 01/03/24 1335

## 2023-12-31 NOTE — Telephone Encounter (Signed)
 I have not received the orders for the ostomy and incontinence supplies. I called [facility] and was able to speak to [facility] / Documentation Dept and requested she email me the orders that need to be signed.  I confirmed my email address and she said she will send them to me.

## 2023-12-31 NOTE — ED Notes (Signed)
 PA notified on patient's hypotension .

## 2023-12-31 NOTE — Telephone Encounter (Signed)
 Patient's cousin/caretaker Crystal [phone]) called in stating she has called to reach the office multiple times with failed attempts due to a medication that was prescribed by the doctor that should not have been prescribed to a patient that has kidney and liver failure. Caretaker is stating that patient is vomiting, can barely move, jaundice in her eyes, and not wanting to eat. Caretaker was demanding to speak to physician. This RN attempted to transfer caretaker to [facility] but no one at the clinic could take the call. Caretaker is stating patient has been sick since antibiotic has not helped symptoms of UTI and has caused other symptoms. Patient's caretaker states she is on DPR but not seeing on file. Caretaker is stating that Zelda NP stated she needed to stay on antibiotic regardless of side effects of being on antibiotic that causes further liver and kidney failure.  Advised caretaker that despite waiting on call back from physician to report frustration, this RN highly advises calling 911 or getting patient to Emergency Room asap for evaluation due to abdominal distention, vomiting, not eating, barely moving, yellowing of eyes.   Copied from CRM [id number]. Topic: Clinical - Red Word Triage >> Dec 31, 2023  4:37 PM Elle L wrote: Red Word that prompted transfer to Nurse Triage: The patient was triaged this morning for medication reactions and the office has not responded at this time. I reached out to [facility] who advised that the prescriber of the medication was out of the office. The patient's caretaker, Aggie Cosier, is concerned as the patient is still symptomatic. She is fatigued, nausea, dry heaving, and a dry cough, yellow tint to her eyes, no appetite.

## 2024-01-01 ENCOUNTER — Encounter: Payer: Self-pay | Admitting: Nurse Practitioner

## 2024-01-01 ENCOUNTER — Telehealth: Payer: Self-pay | Admitting: Nurse Practitioner

## 2024-01-01 DIAGNOSIS — Z87440 Personal history of urinary (tract) infections: Secondary | ICD-10-CM | POA: Diagnosis not present

## 2024-01-01 DIAGNOSIS — B961 Klebsiella pneumoniae [K. pneumoniae] as the cause of diseases classified elsewhere: Secondary | ICD-10-CM | POA: Diagnosis present

## 2024-01-01 DIAGNOSIS — N179 Acute kidney failure, unspecified: Secondary | ICD-10-CM

## 2024-01-01 DIAGNOSIS — E039 Hypothyroidism, unspecified: Secondary | ICD-10-CM | POA: Diagnosis present

## 2024-01-01 DIAGNOSIS — N3020 Other chronic cystitis without hematuria: Secondary | ICD-10-CM | POA: Diagnosis present

## 2024-01-01 DIAGNOSIS — N322 Vesical fistula, not elsewhere classified: Secondary | ICD-10-CM | POA: Diagnosis present

## 2024-01-01 DIAGNOSIS — R112 Nausea with vomiting, unspecified: Secondary | ICD-10-CM | POA: Diagnosis not present

## 2024-01-01 DIAGNOSIS — Z85048 Personal history of other malignant neoplasm of rectum, rectosigmoid junction, and anus: Secondary | ICD-10-CM | POA: Diagnosis not present

## 2024-01-01 DIAGNOSIS — I129 Hypertensive chronic kidney disease with stage 1 through stage 4 chronic kidney disease, or unspecified chronic kidney disease: Secondary | ICD-10-CM | POA: Diagnosis present

## 2024-01-01 DIAGNOSIS — G894 Chronic pain syndrome: Secondary | ICD-10-CM | POA: Diagnosis present

## 2024-01-01 DIAGNOSIS — F17210 Nicotine dependence, cigarettes, uncomplicated: Secondary | ICD-10-CM | POA: Diagnosis present

## 2024-01-01 DIAGNOSIS — B9629 Other Escherichia coli [E. coli] as the cause of diseases classified elsewhere: Secondary | ICD-10-CM

## 2024-01-01 DIAGNOSIS — Z880 Allergy status to penicillin: Secondary | ICD-10-CM | POA: Diagnosis not present

## 2024-01-01 DIAGNOSIS — N3000 Acute cystitis without hematuria: Secondary | ICD-10-CM | POA: Diagnosis not present

## 2024-01-01 DIAGNOSIS — L89154 Pressure ulcer of sacral region, stage 4: Secondary | ICD-10-CM | POA: Diagnosis present

## 2024-01-01 DIAGNOSIS — N390 Urinary tract infection, site not specified: Secondary | ICD-10-CM | POA: Diagnosis not present

## 2024-01-01 DIAGNOSIS — Z1612 Extended spectrum beta lactamase (ESBL) resistance: Secondary | ICD-10-CM

## 2024-01-01 DIAGNOSIS — Z8616 Personal history of COVID-19: Secondary | ICD-10-CM | POA: Diagnosis not present

## 2024-01-01 DIAGNOSIS — Z933 Colostomy status: Secondary | ICD-10-CM | POA: Diagnosis not present

## 2024-01-01 DIAGNOSIS — F319 Bipolar disorder, unspecified: Secondary | ICD-10-CM | POA: Diagnosis present

## 2024-01-01 DIAGNOSIS — N183 Chronic kidney disease, stage 3 unspecified: Secondary | ICD-10-CM | POA: Diagnosis present

## 2024-01-01 DIAGNOSIS — Z79899 Other long term (current) drug therapy: Secondary | ICD-10-CM | POA: Diagnosis not present

## 2024-01-01 DIAGNOSIS — K219 Gastro-esophageal reflux disease without esophagitis: Secondary | ICD-10-CM | POA: Diagnosis present

## 2024-01-01 DIAGNOSIS — I959 Hypotension, unspecified: Secondary | ICD-10-CM | POA: Diagnosis present

## 2024-01-01 DIAGNOSIS — J439 Emphysema, unspecified: Secondary | ICD-10-CM | POA: Diagnosis present

## 2024-01-01 DIAGNOSIS — Z23 Encounter for immunization: Secondary | ICD-10-CM | POA: Diagnosis not present

## 2024-01-01 DIAGNOSIS — E860 Dehydration: Secondary | ICD-10-CM | POA: Diagnosis present

## 2024-01-01 DIAGNOSIS — K7030 Alcoholic cirrhosis of liver without ascites: Secondary | ICD-10-CM | POA: Diagnosis present

## 2024-01-01 LAB — CBC WITH DIFFERENTIAL/PLATELET
Abs Immature Granulocytes: 0.13 10*3/uL — ABNORMAL HIGH (ref 0.00–0.07)
Basophils Absolute: 0 10*3/uL (ref 0.0–0.1)
Basophils Relative: 0 %
Eosinophils Absolute: 1.8 10*3/uL — ABNORMAL HIGH (ref 0.0–0.5)
Eosinophils Relative: 16 %
HCT: 33.9 % — ABNORMAL LOW (ref 36.0–46.0)
Hemoglobin: 10.9 g/dL — ABNORMAL LOW (ref 12.0–15.0)
Immature Granulocytes: 1 %
Lymphocytes Relative: 6 %
Lymphs Abs: 0.7 10*3/uL (ref 0.7–4.0)
MCH: 27.7 pg (ref 26.0–34.0)
MCHC: 32.2 g/dL (ref 30.0–36.0)
MCV: 86.3 fL (ref 80.0–100.0)
Monocytes Absolute: 0.3 10*3/uL (ref 0.1–1.0)
Monocytes Relative: 3 %
Neutro Abs: 8.3 10*3/uL — ABNORMAL HIGH (ref 1.7–7.7)
Neutrophils Relative %: 74 %
Platelets: 191 10*3/uL (ref 150–400)
RBC: 3.93 MIL/uL (ref 3.87–5.11)
RDW: 18 % — ABNORMAL HIGH (ref 11.5–15.5)
WBC: 11.2 10*3/uL — ABNORMAL HIGH (ref 4.0–10.5)
nRBC: 0 % (ref 0.0–0.2)

## 2024-01-01 LAB — COMPREHENSIVE METABOLIC PANEL
ALT: 8 U/L (ref 0–44)
AST: 10 U/L — ABNORMAL LOW (ref 15–41)
Albumin: 2.1 g/dL — ABNORMAL LOW (ref 3.5–5.0)
Alkaline Phosphatase: 56 U/L (ref 38–126)
Anion gap: 9 (ref 5–15)
BUN: 32 mg/dL — ABNORMAL HIGH (ref 6–20)
CO2: 15 mmol/L — ABNORMAL LOW (ref 22–32)
Calcium: 7.9 mg/dL — ABNORMAL LOW (ref 8.9–10.3)
Chloride: 110 mmol/L (ref 98–111)
Creatinine, Ser: 2.17 mg/dL — ABNORMAL HIGH (ref 0.44–1.00)
GFR, Estimated: 26 mL/min — ABNORMAL LOW (ref >60)
Glucose, Bld: 108 mg/dL — ABNORMAL HIGH (ref 70–99)
Potassium: 4.1 mmol/L (ref 3.5–5.1)
Sodium: 134 mmol/L — ABNORMAL LOW (ref 135–145)
Total Bilirubin: 0.4 mg/dL (ref 0.0–1.2)
Total Protein: 5.8 g/dL — ABNORMAL LOW (ref 6.5–8.1)

## 2024-01-01 LAB — HIV ANTIBODY (ROUTINE TESTING W REFLEX): HIV Screen 4th Generation wRfx: NONREACTIVE

## 2024-01-01 MED ORDER — UMECLIDINIUM-VILANTEROL 62.5-25 MCG/ACT IN AEPB
1.0000 | INHALATION_SPRAY | Freq: Every day | RESPIRATORY_TRACT | Status: DC
  Administered 2024-01-02 – 2024-01-12 (×10): 1 via RESPIRATORY_TRACT
  Filled 2024-01-01 (×2): qty 14

## 2024-01-01 MED ORDER — PANTOPRAZOLE SODIUM 40 MG PO TBEC
40.0000 mg | DELAYED_RELEASE_TABLET | Freq: Every day | ORAL | Status: DC
Start: 2024-01-01 — End: 2024-01-12
  Administered 2024-01-01 – 2024-01-12 (×12): 40 mg via ORAL
  Filled 2024-01-01 (×12): qty 1

## 2024-01-01 MED ORDER — AMLODIPINE BESYLATE 10 MG PO TABS
10.0000 mg | ORAL_TABLET | Freq: Every day | ORAL | Status: DC
  Administered 2024-01-01 – 2024-01-12 (×11): 10 mg via ORAL
  Filled 2024-01-01 (×9): qty 1
  Filled 2024-01-01: qty 2
  Filled 2024-01-01 (×3): qty 1

## 2024-01-01 MED ORDER — CARVEDILOL 25 MG PO TABS
25.0000 mg | ORAL_TABLET | Freq: Two times a day (BID) | ORAL | Status: DC
  Administered 2024-01-01 – 2024-01-02 (×2): 25 mg via ORAL
  Filled 2024-01-01 (×3): qty 1

## 2024-01-01 MED ORDER — SODIUM CHLORIDE 0.9 % IV SOLN
1.0000 g | Freq: Once | INTRAVENOUS | Status: DC
  Filled 2024-01-01: qty 20

## 2024-01-01 MED ORDER — SODIUM CHLORIDE 0.9 % IV SOLN
500.0000 mg | Freq: Two times a day (BID) | INTRAVENOUS | Status: AC
  Administered 2024-01-01 – 2024-01-07 (×15): 500 mg via INTRAVENOUS
  Filled 2024-01-01 (×18): qty 10

## 2024-01-01 MED ORDER — OXYCODONE HCL 5 MG PO TABS
5.0000 mg | ORAL_TABLET | Freq: Four times a day (QID) | ORAL | Status: DC | PRN
  Administered 2024-01-01 – 2024-01-04 (×8): 5 mg via ORAL
  Filled 2024-01-01 (×9): qty 1

## 2024-01-01 MED ORDER — ONDANSETRON HCL 4 MG/2ML IJ SOLN
4.0000 mg | Freq: Four times a day (QID) | INTRAMUSCULAR | Status: DC | PRN
  Administered 2024-01-01 – 2024-01-04 (×4): 4 mg via INTRAVENOUS
  Filled 2024-01-01 (×4): qty 2

## 2024-01-01 MED ORDER — FENTANYL CITRATE PF 50 MCG/ML IJ SOSY
50.0000 ug | PREFILLED_SYRINGE | INTRAMUSCULAR | Status: DC | PRN
  Administered 2024-01-01: 50 ug via INTRAVENOUS
  Filled 2024-01-01: qty 1

## 2024-01-01 MED ORDER — ALBUTEROL SULFATE (2.5 MG/3ML) 0.083% IN NEBU: 2.5000 mg | INHALATION_SOLUTION | Freq: Four times a day (QID) | RESPIRATORY_TRACT | Status: DC | PRN

## 2024-01-01 MED ORDER — LACTATED RINGERS IV SOLN: INTRAVENOUS | Status: DC

## 2024-01-01 MED ORDER — HYDROMORPHONE HCL 1 MG/ML IJ SOLN
0.2000 mg | Freq: Once | INTRAMUSCULAR | Status: AC
  Administered 2024-01-01: 0.2 mg via INTRAVENOUS
  Filled 2024-01-01: qty 0.5

## 2024-01-01 MED ORDER — ACETAMINOPHEN 325 MG PO TABS
650.0000 mg | ORAL_TABLET | Freq: Four times a day (QID) | ORAL | Status: DC | PRN
  Administered 2024-01-01 – 2024-01-07 (×5): 650 mg via ORAL
  Filled 2024-01-01 (×5): qty 2

## 2024-01-01 MED ORDER — QUETIAPINE FUMARATE 50 MG PO TABS
50.0000 mg | ORAL_TABLET | Freq: Two times a day (BID) | ORAL | Status: DC
  Administered 2024-01-01 – 2024-01-12 (×24): 50 mg via ORAL
  Filled 2024-01-01 (×12): qty 1
  Filled 2024-01-01: qty 2
  Filled 2024-01-01 (×6): qty 1
  Filled 2024-01-01: qty 2
  Filled 2024-01-01 (×4): qty 1

## 2024-01-01 MED ORDER — ENSURE ENLIVE PO LIQD
237.0000 mL | Freq: Three times a day (TID) | ORAL | Status: DC
  Administered 2024-01-01 – 2024-01-12 (×33): 237 mL via ORAL
  Filled 2024-01-01 (×2): qty 237

## 2024-01-01 MED ORDER — SERTRALINE HCL 50 MG PO TABS
50.0000 mg | ORAL_TABLET | Freq: Every day | ORAL | Status: DC
  Administered 2024-01-01 – 2024-01-12 (×12): 50 mg via ORAL
  Filled 2024-01-01 (×12): qty 1

## 2024-01-01 MED ORDER — SODIUM CHLORIDE 0.9 % IV SOLN: 1.0000 g | INTRAVENOUS | Status: DC

## 2024-01-01 MED ORDER — ENOXAPARIN SODIUM 30 MG/0.3ML IJ SOSY
30.0000 mg | PREFILLED_SYRINGE | INTRAMUSCULAR | Status: DC
  Administered 2024-01-01 – 2024-01-12 (×12): 30 mg via SUBCUTANEOUS
  Filled 2024-01-01 (×11): qty 0.3

## 2024-01-01 NOTE — Telephone Encounter (Signed)
 This has already been addressed

## 2024-01-01 NOTE — Progress Notes (Signed)
 Pharmacy Antibiotic Note  Sherry Baker is a 58 y.o. female admitted on 12/31/2023 with ESBL Klebsiella UTI.  Pharmacy has been consulted for meropenem dosing.  Had been on nitrofurantoin as outpt with ?intolerance.  Plan: Meropenem 500mg  IV Q12H.  Height: 5\' 2"  (157.5 cm) Weight: 49 kg (108 lb) IBW/kg (Calculated) : 50.1  Temp (24hrs), Avg:97.9 F (36.6 C), Min:97.5 F (36.4 C), Max:98.3 F (36.8 C)  Recent Labs  Lab 12/31/23 2017  WBC 12.2*  CREATININE 2.22*    Estimated Creatinine Clearance: 21.9 mL/min (A) (by C-G formula based on SCr of 2.22 mg/dL (H)).    Allergies  Allergen Reactions   Penicillins Hives, Swelling and Rash    Tolerated Unasyn, Rocephin  Did it involve swelling of the face/tongue/throat, SOB, or low BP? Yes  Did it involve sudden or severe rash/hives, skin peeling, or any reaction on the inside of your mouth or nose? No  Did you need to seek medical attention at a hospital or doctor's office? No  When did it last happen?  <10 years    If all above answers are "NO", may proceed with cephalosporin use.  Has patient had a PCN reaction causing immediate rash, facial/tongue/throat swelling, SOB or lightheadedness with hypotension: Yes, Has patient had a PCN reaction causing severe rash involving mucus membranes or skin necrosis: No, Has patient had a PCN reaction that required hospitalization No, Has patient had a PCN reaction occurring within the last 10 years: Yes, If all of the above answers are "NO", then may proceed with Cephalosporin use.    Thank you for allowing pharmacy to be a part of this patient's care.  Vernard Gambles, PharmD, BCPS  01/01/2024 12:55 AM

## 2024-01-01 NOTE — Telephone Encounter (Signed)
 Please see my encounter note. Also patient's previous CT scans do not show any cirrhosis and liver enzymes have not been elevated. Macrobid is not a penicillin and can be prescribed if a patient has an allergy to PCN.

## 2024-01-01 NOTE — Plan of Care (Signed)

## 2024-01-01 NOTE — ED Notes (Addendum)
 Family member Deirdre Hoffman requested to be contacted when speaking to the patient regarding plan of care. [phone]

## 2024-01-01 NOTE — H&P (Signed)
 History and Physical    Sherry Baker:[id number] DOB: [birth year] DOA: 12/31/2023  Chief Complaint: Nausea vomiting.  HPI: Sherry Baker is a 58 y.o. female with history of rectal cancer status postsurgery on colostomy, hypertension, GERD, depression, COPD has been having recurrent UTI recently and has been on antibiotics.  Recently was placed on nitrofurantoin for 5 days and urine cultures on 12/16/2023 grew Klebsiella pneumoniae ESBL.  Patient states that she finished the course of antibiotics yesterday but started having nausea vomiting.  Denies any fever or chills.  ED Course: In the ER CT abdomen pelvis does not show anything acute.  UA is concerning for UTI.  Labs show creatinine is 2.2 which has worsened from 1.6 two weeks ago and 1.3 about a month and a half ago.  Patient was started on fluids and meropenem and admitted for further management for acute renal failure with ESBL UTI.  Review of Systems: As per HPI, rest all negative.   Past Medical History:  Diagnosis Date   Acquired hypothyroidism    Alcohol abuse    Allergy    PCNS swelling   Arthritis    Bipolar 1 disorder (HCC)    Chronic kidney disease    Chronic pain syndrome    Cirrhosis of liver (HCC)    Cocaine abuse (HCC)    COVID-19 virus infection 06/23/2021   Delirium tremens (HCC) 06/22/2021   Depression    Hypertension    Rectal cancer (HCC) 01/21/2017   Suicidal ideation 02/11/2014    Past Surgical History:  Procedure Laterality Date   ABDOMINAL PERINEAL BOWEL RESECTION N/A 06/18/2017   Procedure: ABDOMINAL PERINEAL RESECTION ERAS PATHWAY;  Surgeon: Romie Levee, MD;  Location: [facility];  Service: General;  Laterality: N/A;   BUBBLE STUDY  11/29/2021   Procedure: BUBBLE STUDY;  Surgeon: Thomasene Ripple, DO;  Location: [facility];  Service: Cardiovascular;;   COLON SURGERY     COLONOSCOPY WITH PROPOFOL Left 01/21/2017   Procedure: COLONOSCOPY WITH PROPOFOL;  Surgeon: Kathi Der, MD;  Location: [facility]  ENDOSCOPY;  Service: Gastroenterology;  Laterality: Left;   Colostomy     FRACTURE SURGERY     MANDIBLE FRACTURE SURGERY     TEE WITHOUT CARDIOVERSION N/A 11/29/2021   Procedure: TRANSESOPHAGEAL ECHOCARDIOGRAM (TEE);  Surgeon: Thomasene Ripple, DO;  Location: [facility];  Service: Cardiovascular;  Laterality: N/A;     reports that she has been smoking cigarettes. She started smoking about 47 years ago. She has a 47.2 pack-year smoking history. She has never used smokeless tobacco. She reports that she does not currently use alcohol. She reports that she does not currently use drugs after having used the following drugs: Cocaine and "Crack" cocaine.  Allergies  Allergen Reactions   Penicillins Hives, Swelling and Rash    Tolerated Unasyn, Rocephin  Did it involve swelling of the face/tongue/throat, SOB, or low BP? Yes  Did it involve sudden or severe rash/hives, skin peeling, or any reaction on the inside of your mouth or nose? No  Did you need to seek medical attention at a hospital or doctor's office? No  When did it last happen?  <10 years    If all above answers are "NO", may proceed with cephalosporin use.  Has patient had a PCN reaction causing immediate rash, facial/tongue/throat swelling, SOB or lightheadedness with hypotension: Yes, Has patient had a PCN reaction causing severe rash involving mucus membranes or skin necrosis: No, Has patient had a PCN reaction that required hospitalization No, Has patient  had a PCN reaction occurring within the last 10 years: Yes, If all of the above answers are "NO", then may proceed with Cephalosporin use.    Family History  Problem Relation Age of Onset   Cancer Mother 58       Originating in the abdomen, otherwise unknwon   Cancer - Other Maternal Aunt        Throat    Prior to Admission medications   Medication Sig Start Date End Date Taking? Authorizing Provider  acetaminophen-codeine (TYLENOL #3) 300-30 MG tablet Take 1-2 tablets by  mouth every 8 (eight) hours as needed for moderate pain (pain score 4-6). 12/16/23  Yes Claiborne Rigg, NP  albuterol (VENTOLIN HFA) 108 (90 Base) MCG/ACT inhaler Inhale 2 puffs into the lungs every 6 (six) hours as needed for wheezing or shortness of breath. 12/16/23  Yes Claiborne Rigg, NP  amLODipine (NORVASC) 10 MG tablet Take 1 tablet (10 mg total) by mouth daily. 10/13/23  Yes Claiborne Rigg, NP  carvedilol (COREG) 25 MG tablet Take 1 tablet (25 mg total) by mouth 2 (two) times daily with a meal. 02/10/23  Yes Storm Frisk, MD  feeding supplement (ENSURE ENLIVE / ENSURE PLUS) LIQD Take 237 mLs by mouth 3 (three) times daily between meals. 12/29/23  Yes Claiborne Rigg, NP  ondansetron (ZOFRAN) 4 MG tablet Take 1 tablet (4 mg total) by mouth every 8 (eight) hours as needed for nausea or vomiting. 12/29/23  Yes Claiborne Rigg, NP  pantoprazole (PROTONIX) 40 MG tablet Take 1 tablet (40 mg total) by mouth daily. 10/12/23  Yes Claiborne Rigg, NP  potassium chloride (KLOR-CON) 10 MEQ tablet Take 1 tablet (10 mEq total) by mouth daily. 11/16/23 12/31/24 Yes Trifan, Kermit Balo, MD  QUEtiapine (SEROQUEL) 50 MG tablet Take 1 tablet (50 mg total) by mouth 2 (two) times daily. 12/16/23  Yes Claiborne Rigg, NP  sertraline (ZOLOFT) 50 MG tablet Take 1 tablet (50 mg total) by mouth daily. 12/16/23  Yes Claiborne Rigg, NP  umeclidinium-vilanterol (ANORO ELLIPTA) 62.5-25 MCG/ACT AEPB Inhale 1 puff into the lungs daily. Patient taking differently: Inhale 1 puff into the lungs daily as needed (for shortness of breath). 02/10/23  Yes Storm Frisk, MD  fluconazole (DIFLUCAN) 150 MG tablet Take 1 tablet (150 mg total) by mouth every 3 (three) days. For yeast infection Patient not taking: Reported on 01/01/2024 12/16/23   Claiborne Rigg, NP  nitrofurantoin, macrocrystal-monohydrate, (MACROBID) 100 MG capsule Take 1 capsule (100 mg total) by mouth 2 (two) times daily for 5 days. Patient not taking: Reported  on 01/01/2024 12/29/23 01/03/24  Claiborne Rigg, NP  sodium bicarbonate 650 MG tablet Take 1 tablet (650 mg total) by mouth daily. Patient not taking: Reported on 01/01/2024 02/10/23   Storm Frisk, MD  valsartan (DIOVAN) 160 MG tablet Take 1 tablet (160 mg total) by mouth daily. Patient not taking: Reported on 01/01/2024 02/10/23   Storm Frisk, MD  ziprasidone (GEODON) 40 MG capsule Take 1 capsule (40 mg total) by mouth 2 (two) times daily with a meal. 11/06/11 01/09/12  Mancel Bale, MD    Physical Exam: Constitutional: Moderately built and nourished. Vitals:   12/31/23 2100 12/31/23 2300 01/01/24 0032 01/01/24 0111  BP: 112/75 108/79  98/76  Pulse: 86 91  76  Resp: 17 (!) 29  (!) 21  Temp:   98.3 F (36.8 C)   TempSrc:   Oral   SpO2:  98% 100%  97%  Weight:   49 kg   Height:   5\' 2"  (1.575 m)    Eyes: Anicteric no pallor. ENMT: No discharge from the ears eyes nose or mouth. Neck: No mass felt.  No neck rigidity. Respiratory: No rhonchi or crepitations. Cardiovascular: S1 S2 heard. Abdomen: Soft nontender bowel sound present.  Sacral decubitus. Musculoskeletal: No edema. Skin: Sacral decubitus. Neurologic: Alert awake oriented to time place and person.  Moves all extremities. Psychiatric: Appears normal.  Normal affect.   Labs on Admission: I have personally reviewed following labs and imaging studies  CBC: Recent Labs  Lab 12/31/23 2017  WBC 12.2*  HGB 13.4  HCT 42.9  MCV 88.3  PLT 242   Basic Metabolic Panel: Recent Labs  Lab 12/31/23 2017  NA 133*  K 4.5  CL 104  CO2 15*  GLUCOSE 87  BUN 30*  CREATININE 2.22*  CALCIUM 8.7*   GFR: Estimated Creatinine Clearance: 21.9 mL/min (A) (by C-G formula based on SCr of 2.22 mg/dL (H)). Liver Function Tests: Recent Labs  Lab 12/31/23 2017  AST 15  ALT 10  ALKPHOS 67  BILITOT 0.3  PROT 7.8  ALBUMIN 2.8*   Recent Labs  Lab 12/31/23 2017  LIPASE 20   No results for input(s): "AMMONIA" in the last 168  hours. Coagulation Profile: No results for input(s): "INR", "PROTIME" in the last 168 hours. Cardiac Enzymes: No results for input(s): "CKTOTAL", "CKMB", "CKMBINDEX", "TROPONINI" in the last 168 hours. BNP (last 3 results) No results for input(s): "PROBNP" in the last 8760 hours. HbA1C: No results for input(s): "HGBA1C" in the last 72 hours. CBG: No results for input(s): "GLUCAP" in the last 168 hours. Lipid Profile: No results for input(s): "CHOL", "HDL", "LDLCALC", "TRIG", "CHOLHDL", "LDLDIRECT" in the last 72 hours. Thyroid Function Tests: No results for input(s): "TSH", "T4TOTAL", "FREET4", "T3FREE", "THYROIDAB" in the last 72 hours. Anemia Panel: No results for input(s): "VITAMINB12", "FOLATE", "FERRITIN", "TIBC", "IRON", "RETICCTPCT" in the last 72 hours. Urine analysis:    Component Value Date/Time   COLORURINE YELLOW 12/31/2023 2334   APPEARANCEUR TURBID (A) 12/31/2023 2334   APPEARANCEUR Cloudy (A) 12/16/2023 1050   LABSPEC 1.018 12/31/2023 2334   PHURINE 5.0 12/31/2023 2334   GLUCOSEU NEGATIVE 12/31/2023 2334   HGBUR LARGE (A) 12/31/2023 2334   BILIRUBINUR NEGATIVE 12/31/2023 2334   BILIRUBINUR Negative 12/16/2023 1050   KETONESUR NEGATIVE 12/31/2023 2334   PROTEINUR 100 (A) 12/31/2023 2334   UROBILINOGEN 0.2 10/12/2023 1630   UROBILINOGEN 0.2 04/22/2022 1151   NITRITE POSITIVE (A) 12/31/2023 2334   LEUKOCYTESUR MODERATE (A) 12/31/2023 2334   Sepsis Labs: @LABRCNTIP (procalcitonin:4,lacticidven:4) )No results found for this or any previous visit (from the past 240 hours).   Radiological Exams on Admission: CT ABDOMEN PELVIS WO CONTRAST Result Date: 12/31/2023 CLINICAL DATA:  Abdominal pain with nausea and vomiting. History of rectal cancer with resection and colostomy. EXAM: CT ABDOMEN AND PELVIS WITHOUT CONTRAST TECHNIQUE: Multidetector CT imaging of the abdomen and pelvis was performed following the standard protocol without IV contrast. RADIATION DOSE REDUCTION:  This exam was performed according to the departmental dose-optimization program which includes automated exposure control, adjustment of the mA and/or kV according to patient size and/or use of iterative reconstruction technique. COMPARISON:  CT abdomen and pelvis 11/16/2023 FINDINGS: Lower chest: Chronic appearing reticular opacities are again noted in both lung bases. Hepatobiliary: No focal liver abnormality is seen. No gallstones, gallbladder wall thickening, or biliary dilatation. Pancreas: Unremarkable. No pancreatic ductal dilatation  or surrounding inflammatory changes. Spleen: Normal in size without focal abnormality. Adrenals/Urinary Tract: Diffuse bladder wall thickening is again noted. There is no hydronephrosis. Mild right renal atrophy is again seen. No urinary tract calculi. Adrenal glands are within normal limits. Stomach/Bowel: Left-sided colostomy is again noted. There is no evidence for bowel obstruction, pneumatosis, acute inflammation or free air. The appendix is within normal limits. There is a small hiatal hernia. The stomach is otherwise within normal limits. Vascular/Lymphatic: Prominent vascular calcifications are again seen. Aorta and IVC are normal in size. No enlarged lymph nodes are identified allowing for lack of intravenous contrast. Reproductive: Uterus and bilateral adnexa are unremarkable. Other: Presacral thickening and stranding are again noted, unchanged. There is no ascites. Musculoskeletal: Sacrococcygeal decubitus ulcer with erosive changes of the tip of the coccyx appear unchanged. Severe/end-stage degenerative changes of both hips again noted. IMPRESSION: 1. No acute localizing process in the abdomen or pelvis. 2. Stable left-sided colostomy. No bowel obstruction. 3. Stable sacrococcygeal decubitus ulcer with erosive changes of the tip of the coccyx. 4. Stable diffuse bladder wall thickening. Correlate clinically for cystitis. 5. Stable chronic appearing reticular opacities  in both lung bases. 6. 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 7. Small hiatal hernia. 8. Aortic atherosclerosis. Aortic Atherosclerosis (ICD10-I70.0). Electronically Signed   By: Darliss Cheney M.D.   On: 12/31/2023 23:53    EKG: Independently reviewed.  Normal sinus rhythm.  Assessment/Plan Principal Problem:   ARF (acute renal failure) (HCC)    Acute on chronic kidney disease stage III likely because of nausea vomiting and dehydration.  Will gently hydrate follow metabolic panel. ESBL UTI on meropenem. Nausea vomiting could be from UTI advance diet as tolerated.  CT abdomen and pelvis unremarkable. Hypertension on amlodipine and Coreg. COPD not actively wheezing continue Anoro Ellipta. History of rectal cancer status post resection and colostomy. Sacral decubitus.  Wound team consult. History of liver cirrhosis mentioned in the chart.  Appears compensated. History of depression on Seroquel and Zoloft.  Since patient has ESBL UTI with intractable nausea vomiting acute renal failure will need more than 2 midnight stay.   DVT prophylaxis: Lovenox. Code Status: Full code. Family Communication: Discussed with patient. Disposition Plan: Medical floor. Consults called: Wound team. Admission status: Observation.

## 2024-01-01 NOTE — ED Notes (Signed)
 Patient changed into a clean brief

## 2024-01-01 NOTE — Telephone Encounter (Signed)
 S.W. patient caregiver Crystal. There were many inaccurate accusations from caregiver Crystal in her phone call to the office yesterday. I instructed Crystal today, based on Ms Caravello recent urine culture and sensitivity that po antibiotics the could be prescribed for her were limited and likely not going to be effective and as she had been on macrobid in the past several years ago with no reported intolerances it was prescribed for her again to treat UTI. Also when macrobid was prescribed recently her creatine clearance was 35.  Crystal had also previously explained to me earlier this week on Tuesday that that Ms. Teagarden had reportedly gotten sick over the weekend while taking her initial dose of macrobid and she had stopped taking it. She also has a history of ETOH abuse. I instructed Crystal on Tuesday that Ms. Pollman stopping and starting antibiotics would contribute to her UTI not clearing.  Crystal also stated to me that since she does not live with Ms. Strathman she could not accurately say how Ms. Arentz had been taking her medication/antibiotic. So at that time I refilled the macrobid again as there had been a delay in treatment.    Today I have instructed Crystal that Ms. Ament will need to see another provider here in this office due to the inaccurate information relayed in the message to the clinic. I take my medical profession very serious and at this time do not agree with the information relayed by Ms [facility] caregiver nor do I feel it is in my best interest to continue providing medical care to Ms. Eula Fried states they will more than likely find another practice for her to establish care.

## 2024-01-01 NOTE — Consult Note (Addendum)
 WOC Nurse ostomy consult note Stoma type/location: LLQ colostomy Requested to assess pain around the ostomy site. Pt does not have problems with the ostomy itself. Painful and bulge area at 3 o'clock position suggesting a hernia, may a incarceration.  This is beyond [place] nurse role. Ostomy pouching: 1pc. Lawson#[id number], barrier ring [id number]  Nurse instructions: - The pouch need to be change twice per week or if is leaking. - Clean the skin with saline, or just water.  - Cut the barrier with the size and shape as the ostomy has.  - Take the protect plastic off the barrier. That can be use as a model for the next pouch system. - Apply the ring surrounding the cut on the barrier, to make a better seal. - To empty the pouch, need to be several times per day, when becomes 1/3 full.  Education provided: The pt does not need education, old ostomy. Enrolled patient in [employer] DC program: No    WOC team will not plan to follow further.  Please reconsult if further assistance is needed. Thank-you,  Denyse Amass BSN, RN, [employer], WOC  (Pager: [phone])

## 2024-01-01 NOTE — Progress Notes (Addendum)
 Progress Note   Patient: Sherry Baker ZOX:[id number] DOB: [birth year] DOA: 12/31/2023     0 DOS: the patient was seen and examined on 01/01/2024   Brief hospital course: 58 year old woman with PMH of rectal cancer s/p surgery with colostomy, HTN, GERD, depression, COPD, recurrent UTIs who presented with nausea, vomiting.  Patient reported that she had just finished a course of antibiotics.  Presented with AKI, UA concerning for UTI.  Noted that recent urine cultures grew ESBL Klebsiella.  Patient was started on meropenem.  Assessment and Plan:  AKI on CKD Baseline creatinine is 1.4-1.5. Patient presented with creatinine of 2.2 Likely prerenal due to poor p.o. intake, nausea, vomiting. - Continue IV fluids. - Antiemetics  ESBL Klebsiella UTI Urine cultures from 12/16/2023 grew ESBL Klebsiella pneumoniae. Patient was treated with nitrofurantoin as outpatient. -Continue meropenem. -Follow-up urine cultures.  Abdominal pain. Unclear etiology Abdominal CT done on admission showed diffuse bladder wall thickening, no evidence of bowel obstruction, pneumatosis or acute inflammation of the bowel. -As needed oxycodone, Tylenol.  History of rectal cancer s/p colostomy -Continue colostomy care.  HTN -Continue home Coreg, Norvasc.  COPD -Continue home Anoro Ellipta.  Depression -Continue home Zoloft, Seroquel.  Subjective: Patient complains of abdominal pain, nausea, vomiting.  Physical Exam: Vitals:   01/01/24 0800 01/01/24 0817 01/01/24 0900 01/01/24 1148  BP: (!) 85/54 123/76 104/64   Pulse:   73   Resp: (!) 21  (!) 25   Temp:    98.1 F (36.7 C)  TempSrc:    Oral  SpO2:   94%   Weight:      Height:        General: Alert, oriented X3  Eyes: Pupils equal, reactive  Oral cavity: moist mucous membranes  Head: Atraumatic, normocephalic  Neck: supple  Chest: clear to auscultation. No crackles, no wheezes  CVS: S1,S2 RRR. No murmurs  Abd: No distention, soft, non-tender.  Colostomy bag in place Extr: No edema   MSK: No joint deformities or swelling  Neurological: Grossly intact.   Other: Well granulated perineal wound (post- surgical resection)   Data Reviewed:     Latest Ref Rng & Units 01/01/2024    2:30 AM 12/31/2023    8:17 PM 12/16/2023   10:50 AM  CBC  WBC 4.0 - 10.5 K/uL 11.2  12.2  5.2   Hemoglobin 12.0 - 15.0 g/dL 81.1  91.4  78.2   Hematocrit 36.0 - 46.0 % 33.9  42.9  41.6   Platelets 150 - 400 K/uL 191  242  283       Latest Ref Rng & Units 01/01/2024    2:30 AM 12/31/2023    8:17 PM 12/16/2023   10:50 AM  BMP  Glucose 70 - 99 mg/dL 956  87  50   BUN 6 - 20 mg/dL 32  30  21   Creatinine 0.44 - 1.00 mg/dL 2.13  0.86  5.78   BUN/Creat Ratio 9 - 23   13   Sodium 135 - 145 mmol/L 134  133  138   Potassium 3.5 - 5.1 mmol/L 4.1  4.5  5.1   Chloride 98 - 111 mmol/L 110  104  106   CO2 22 - 32 mmol/L 15  15  12    Calcium 8.9 - 10.3 mg/dL 7.9  8.7  9.2      Family Communication: n/a  Disposition: Status is: Inpatient Remains inpatient appropriate because: Receiving IV antibiotics for ESBL Klebsiella UTI  Planned Discharge Destination:  Home    Time spent: n/a  Author: MDALA-GAUSI, Gwenette Greet, MD 01/01/2024 1:36 PM  Morning admit.  No charge  For on call review [email or web address].

## 2024-01-01 NOTE — Telephone Encounter (Signed)
 Telephone encounter on this issue has already been sent to provider.

## 2024-01-02 ENCOUNTER — Inpatient Hospital Stay (HOSPITAL_COMMUNITY): Payer: MEDICAID

## 2024-01-02 DIAGNOSIS — N179 Acute kidney failure, unspecified: Secondary | ICD-10-CM | POA: Diagnosis not present

## 2024-01-02 LAB — BASIC METABOLIC PANEL
Anion gap: 10 (ref 5–15)
BUN: 32 mg/dL — ABNORMAL HIGH (ref 6–20)
CO2: 18 mmol/L — ABNORMAL LOW (ref 22–32)
Calcium: 9 mg/dL (ref 8.9–10.3)
Chloride: 108 mmol/L (ref 98–111)
Creatinine, Ser: 1.91 mg/dL — ABNORMAL HIGH (ref 0.44–1.00)
GFR, Estimated: 30 mL/min — ABNORMAL LOW (ref >60)
Glucose, Bld: 112 mg/dL — ABNORMAL HIGH (ref 70–99)
Potassium: 4.4 mmol/L (ref 3.5–5.1)
Sodium: 136 mmol/L (ref 135–145)

## 2024-01-02 LAB — PROCALCITONIN: Procalcitonin: 1.64 ng/mL

## 2024-01-02 LAB — CBC
HCT: 34.4 % — ABNORMAL LOW (ref 36.0–46.0)
Hemoglobin: 11 g/dL — ABNORMAL LOW (ref 12.0–15.0)
MCH: 27.7 pg (ref 26.0–34.0)
MCHC: 32 g/dL (ref 30.0–36.0)
MCV: 86.6 fL (ref 80.0–100.0)
Platelets: 201 10*3/uL (ref 150–400)
RBC: 3.97 MIL/uL (ref 3.87–5.11)
RDW: 18.3 % — ABNORMAL HIGH (ref 11.5–15.5)
WBC: 9.5 10*3/uL (ref 4.0–10.5)
nRBC: 0.2 % (ref 0.0–0.2)

## 2024-01-02 LAB — PHOSPHORUS: Phosphorus: 3.8 mg/dL (ref 2.5–4.6)

## 2024-01-02 LAB — MAGNESIUM: Magnesium: 1.7 mg/dL (ref 1.7–2.4)

## 2024-01-02 LAB — C-REACTIVE PROTEIN: CRP: 8.2 mg/dL — ABNORMAL HIGH (ref <1.0)

## 2024-01-02 MED ORDER — MAGNESIUM SULFATE 2 GM/50ML IV SOLN
2.0000 g | Freq: Once | INTRAVENOUS | Status: AC
  Administered 2024-01-02: 2 g via INTRAVENOUS
  Filled 2024-01-02: qty 50

## 2024-01-02 MED ORDER — LACTATED RINGERS IV SOLN: INTRAVENOUS | Status: DC

## 2024-01-02 NOTE — Progress Notes (Signed)
 PROGRESS NOTE                                                                                                                                                                                                             Patient Demographics:    Sherry Baker, is a 58 y.o. female, DOB - 1966/07/14, ZOX:[id number]  Outpatient Primary MD for the patient is Storm Frisk, MD    LOS - 1  Admit date - 12/31/2023    Chief Complaint  Patient presents with   Abdominal Pain    Hypotensive       Brief Narrative (HPI from H&P)   58 year old woman with PMH of rectal cancer s/p surgery with colostomy, HTN, GERD, depression, COPD, recurrent UTIs who presented with nausea, vomiting.  Patient reported that she had just finished a course of antibiotics.  Presented with AKI, UA concerning for UTI.  Noted that recent urine cultures grew ESBL Klebsiella.  Patient was started on meropenem.    Subjective:    [name] today has, No headache, No chest pain, mild suprapubic fullness and ache  - No Nausea, No new weakness tingling or numbness, no SOB   Assessment  & Plan :   AKI on CKD Baseline creatinine is 1.4-1.5. Ackley prerenal due to nausea vomiting and dehydration improving with IV fluids continue, check renal ultrasound as well.   ESBL Klebsiella UTI Urine cultures from 12/16/2023 grew ESBL Klebsiella pneumoniae. Patient was treated with nitrofurantoin as outpatient.  Still has symptoms continue meropenem.   Abdominal pain. Normal CT nonacute question due to UTI, treat UTI and monitor appears to be in no distress abdominal exam is benign   History of rectal cancer s/p colostomy -Continue colostomy care.   HTN -Continue home Coreg, Norvasc.   COPD -Continue home Anoro Ellipta.   Depression.  No acute issues supportive care.  Neck sacral decubitus ulcer present on admission kindly see nursing notes.  Supportive care no acute  issues.      Condition - Extremely Guarded  Family Communication  : Patient's cousin crystal [phone] called 01/02/2024 and message left 10:20 AM.  Code Status :   Full  Consults  :  None  PUD Prophylaxis : PPI   Procedures  :     CT - 1. No acute localizing process in the abdomen  or pelvis. 2. Stable left-sided colostomy. No bowel obstruction. 3. Stable sacrococcygeal decubitus ulcer with erosive changes of the tip of the coccyx. 4. Stable diffuse bladder wall thickening. Correlate clinically for cystitis. 5. Stable chronic appearing reticular opacities in both lung bases. 6. 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 7. Small hiatal hernia. 8. Aortic atherosclerosis. Aortic Atherosclerosis (ICD10-I70.0).      Disposition Plan  :    Status is: Inpatient   DVT Prophylaxis  :    enoxaparin (LOVENOX) injection 30 mg Start: 01/01/24 1400    Lab Results  Component Value Date   PLT 201 01/02/2024    Diet :  Diet Order             Diet Heart Room service appropriate? Yes; Fluid consistency: Thin  Diet effective now                    Inpatient Medications  Scheduled Meds:  amLODipine  10 mg Oral Daily   carvedilol  25 mg Oral BID WC   enoxaparin (LOVENOX) injection  30 mg Subcutaneous Q24H   feeding supplement  237 mL Oral TID BM   pantoprazole  40 mg Oral Daily   QUEtiapine  50 mg Oral BID   sertraline  50 mg Oral Daily   umeclidinium-vilanterol  1 puff Inhalation Daily   Continuous Infusions:  magnesium sulfate bolus IVPB     meropenem (MERREM) IV 500 mg (01/01/24 2133)   PRN Meds:.acetaminophen, albuterol, ondansetron (ZOFRAN) IV, oxyCODONE    Objective:   Vitals:   01/01/24 1734 01/01/24 2000 01/01/24 2300 01/02/24 0345  BP: 124/69 (!) 111/90  112/66  Pulse: 76 76  72  Resp: 20 20 20 19   Temp: 98.1 F (36.7 C) 98.3 F (36.8 C) 97.6 F (36.4 C) 98 F (36.7 C)  TempSrc: Oral  Oral Oral  SpO2: 91% 93%  96%  Weight: 51.1  kg     Height: 5\' 2"  (1.575 m)       Wt Readings from Last 3 Encounters:  01/01/24 51.1 kg  12/16/23 49.2 kg  11/16/23 47.6 kg     Intake/Output Summary (Last 24 hours) at 01/02/2024 1014 Last data filed at 01/02/2024 0646 Gross per 24 hour  Intake 720 ml  Output 675 ml  Net 45 ml     Physical Exam  Awake Alert, No new F.N deficits, Normal affect [place].AT,PERRAL Supple Neck, No JVD,   Symmetrical Chest wall movement, Good air movement bilaterally, CTAB RRR,No Gallops,Rubs or new Murmurs,  +ve B.Sounds, Abd Soft, No tenderness, colostomy bag with brown stool No Cyanosis, Clubbing or edema     RN pressure injury documentation: Pressure Injury 02/03/22 Sacrum Stage 4 - Full thickness tissue loss with exposed bone, tendon or muscle. (Active)  02/03/22   Location: Sacrum  Location Orientation:   Staging: Stage 4 - Full thickness tissue loss with exposed bone, tendon or muscle.  Wound Description (Comments):   Present on Admission: Yes     Pressure Injury 03/06/22 Sacrum Stage 4 - Full thickness tissue loss with exposed bone, tendon or muscle. (Active)  03/06/22 1900  Location: Sacrum  Location Orientation:   Staging: Stage 4 - Full thickness tissue loss with exposed bone, tendon or muscle.  Wound Description (Comments):   Present on Admission: Yes     Pressure Injury 04/09/22 Buttocks Right Stage 2 -  Partial thickness loss of dermis presenting as a shallow open injury with a red, pink wound bed  without slough. pink, red (Active)  04/09/22 0415  Location: Buttocks  Location Orientation: Right  Staging: Stage 2 -  Partial thickness loss of dermis presenting as a shallow open injury with a red, pink wound bed without slough.  Wound Description (Comments): pink, red  Present on Admission: Yes     Pressure Injury 04/09/22 Sacrum Upper Stage 3 -  Full thickness tissue loss. Subcutaneous fat may be visible but bone, tendon or muscle are NOT exposed. (Active)  04/09/22 0415   Location: Sacrum  Location Orientation: Upper  Staging: Stage 3 -  Full thickness tissue loss. Subcutaneous fat may be visible but bone, tendon or muscle are NOT exposed.  Wound Description (Comments):   Present on Admission: No     Pressure Injury 04/13/22 Buttocks Right Deep Tissue Pressure Injury - Purple or maroon localized area of discolored intact skin or blood-filled blister due to damage of underlying soft tissue from pressure and/or shear. (Active)  04/13/22 2042  Location: Buttocks  Location Orientation: Right  Staging: Deep Tissue Pressure Injury - Purple or maroon localized area of discolored intact skin or blood-filled blister due to damage of underlying soft tissue from pressure and/or shear.  Wound Description (Comments):   Present on Admission: No     Pressure Injury 04/13/22 Toe (Comment  which one) Anterior;Right Deep Tissue Pressure Injury - Purple or maroon localized area of discolored intact skin or blood-filled blister due to damage of underlying soft tissue from pressure and/or shear. (Active)  04/13/22 2042  Location: Toe (Comment  which one)  Location Orientation: Anterior;Right  Staging: Deep Tissue Pressure Injury - Purple or maroon localized area of discolored intact skin or blood-filled blister due to damage of underlying soft tissue from pressure and/or shear.  Wound Description (Comments):   Present on Admission:      Pressure Injury 06/11/22 Buttocks Left Stage 2 -  Partial thickness loss of dermis presenting as a shallow open injury with a red, pink wound bed without slough. (Active)  06/11/22 1257  Location: Buttocks  Location Orientation: Left  Staging: Stage 2 -  Partial thickness loss of dermis presenting as a shallow open injury with a red, pink wound bed without slough.  Wound Description (Comments):   Present on Admission: No     Pressure Injury 06/11/22 Buttocks Right Stage 2 -  Partial thickness loss of dermis presenting as a shallow open  injury with a red, pink wound bed without slough. (Active)  06/11/22 1258  Location: Buttocks  Location Orientation: Right  Staging: Stage 2 -  Partial thickness loss of dermis presenting as a shallow open injury with a red, pink wound bed without slough.  Wound Description (Comments):   Present on Admission:      Pressure Injury 10/26/22 Coccyx Mid Stage 4 - Full thickness tissue loss with exposed bone, tendon or muscle. (Active)  10/26/22 1430  Location: Coccyx  Location Orientation: Mid  Staging: Stage 4 - Full thickness tissue loss with exposed bone, tendon or muscle.  Wound Description (Comments):   Present on Admission: Yes     Pressure Injury 10/26/22 Knee Anterior;Left Deep Tissue Pressure Injury - Purple or maroon localized area of discolored intact skin or blood-filled blister due to damage of underlying soft tissue from pressure and/or shear. (Active)  10/26/22 1330  Location: Knee  Location Orientation: Anterior;Left  Staging: Deep Tissue Pressure Injury - Purple or maroon localized area of discolored intact skin or blood-filled blister due to damage of underlying soft tissue from  pressure and/or shear.  Wound Description (Comments):   Present on Admission: Yes     Pressure Injury 10/28/22 Elbow Posterior;Right Stage 1 -  Intact skin with non-blanchable redness of a localized area usually over a bony prominence. (Active)  10/28/22 1235  Location: Elbow  Location Orientation: Posterior;Right  Staging: Stage 1 -  Intact skin with non-blanchable redness of a localized area usually over a bony prominence.  Wound Description (Comments):   Present on Admission: Yes     Pressure Injury 10/28/22 Elbow Left;Posterior Stage 1 -  Intact skin with non-blanchable redness of a localized area usually over a bony prominence. (Active)  10/28/22 1235  Location: Elbow  Location Orientation: Left;Posterior  Staging: Stage 1 -  Intact skin with non-blanchable redness of a localized area  usually over a bony prominence.  Wound Description (Comments):   Present on Admission:      Pressure Injury 01/01/24 Coccyx Mid Deep Open Red Wound (Active)  01/01/24 1800  Location: Coccyx  Location Orientation: Mid  Staging:   Wound Description (Comments): Deep Open Red Wound  Present on Admission: Yes  Dressing Type Gauze (Comment) 01/02/24 0437      Data Review:    Recent Labs  Lab 12/31/23 2017 01/01/24 0230 01/02/24 0613  WBC 12.2* 11.2* 9.5  HGB 13.4 10.9* 11.0*  HCT 42.9 33.9* 34.4*  PLT 242 191 201  MCV 88.3 86.3 86.6  MCH 27.6 27.7 27.7  MCHC 31.2 32.2 32.0  RDW 18.2* 18.0* 18.3*  LYMPHSABS  --  0.7  --   MONOABS  --  0.3  --   EOSABS  --  1.8*  --   BASOSABS  --  0.0  --     Recent Labs  Lab 12/31/23 2017 01/01/24 0230 01/02/24 0613  NA 133* 134* 136  K 4.5 4.1 4.4  CL 104 110 108  CO2 15* 15* 18*  ANIONGAP 14 9 10   GLUCOSE 87 108* 112*  BUN 30* 32* 32*  CREATININE 2.22* 2.17* 1.91*  AST 15 10*  --   ALT 10 8  --   ALKPHOS 67 56  --   BILITOT 0.3 0.4  --   ALBUMIN 2.8* 2.1*  --   CRP  --   --  8.2*  PROCALCITON  --   --  1.64  MG  --   --  1.7  PHOS  --   --  3.8  CALCIUM 8.7* 7.9* 9.0      Recent Labs  Lab 12/31/23 2017 01/01/24 0230 01/02/24 0613  CRP  --   --  8.2*  PROCALCITON  --   --  1.64  MG  --   --  1.7  CALCIUM 8.7* 7.9* 9.0    --------------------------------------------------------------------------------------------------------------- Lab Results  Component Value Date   TRIG 176 (H) 11/03/2022    Lab Results  Component Value Date   HGBA1C 5.0 02/06/2022   No results for input(s): "TSH", "T4TOTAL", "FREET4", "T3FREE", "THYROIDAB" in the last 72 hours. No results for input(s): "VITAMINB12", "FOLATE", "FERRITIN", "TIBC", "IRON", "RETICCTPCT" in the last 72 hours. ------------------------------------------------------------------------------------------------------------------ Cardiac Enzymes No results for  input(s): "CKMB", "TROPONINI", "MYOGLOBIN" in the last 168 hours.  Invalid input(s): "CK"  Micro Results No results found for this or any previous visit (from the past 240 hours).  Radiology Report CT ABDOMEN PELVIS WO CONTRAST Result Date: 12/31/2023 CLINICAL DATA:  Abdominal pain with nausea and vomiting. History of rectal cancer with resection and colostomy. EXAM: CT ABDOMEN AND PELVIS  WITHOUT CONTRAST TECHNIQUE: Multidetector CT imaging of the abdomen and pelvis was performed following the standard protocol without IV contrast. RADIATION DOSE REDUCTION: This exam was performed according to the departmental dose-optimization program which includes automated exposure control, adjustment of the mA and/or kV according to patient size and/or use of iterative reconstruction technique. COMPARISON:  CT abdomen and pelvis 11/16/2023 FINDINGS: Lower chest: Chronic appearing reticular opacities are again noted in both lung bases. Hepatobiliary: No focal liver abnormality is seen. No gallstones, gallbladder wall thickening, or biliary dilatation. Pancreas: Unremarkable. No pancreatic ductal dilatation or surrounding inflammatory changes. Spleen: Normal in size without focal abnormality. Adrenals/Urinary Tract: Diffuse bladder wall thickening is again noted. There is no hydronephrosis. Mild right renal atrophy is again seen. No urinary tract calculi. Adrenal glands are within normal limits. Stomach/Bowel: Left-sided colostomy is again noted. There is no evidence for bowel obstruction, pneumatosis, acute inflammation or free air. The appendix is within normal limits. There is a small hiatal hernia. The stomach is otherwise within normal limits. Vascular/Lymphatic: Prominent vascular calcifications are again seen. Aorta and IVC are normal in size. No enlarged lymph nodes are identified allowing for lack of intravenous contrast. Reproductive: Uterus and bilateral adnexa are unremarkable. Other: Presacral thickening and  stranding are again noted, unchanged. There is no ascites. Musculoskeletal: Sacrococcygeal decubitus ulcer with erosive changes of the tip of the coccyx appear unchanged. Severe/end-stage degenerative changes of both hips again noted. IMPRESSION: 1. No acute localizing process in the abdomen or pelvis. 2. Stable left-sided colostomy. No bowel obstruction. 3. Stable sacrococcygeal decubitus ulcer with erosive changes of the tip of the coccyx. 4. Stable diffuse bladder wall thickening. Correlate clinically for cystitis. 5. Stable chronic appearing reticular opacities in both lung bases. 6. 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 7. Small hiatal hernia. 8. Aortic atherosclerosis. Aortic Atherosclerosis (ICD10-I70.0). Electronically Signed   By: Darliss Cheney M.D.   On: 12/31/2023 23:53     Signature  -   Susa Raring M.D on 01/02/2024 at 10:14 AM   -  To page go to [email or web address]

## 2024-01-02 NOTE — Evaluation (Signed)
 Physical Therapy Evaluation Patient Details Name: Sherry Baker MRN: [id number] DOB: 1966-07-31 Today's Date: 01/02/2024  History of Present Illness  58 year old woman who presented 2/27 with nausea, vomiting. Dx: AKI, UA concerning for UTI.  Noted that recent urine cultures grew ESBL Klebsiella. PMH of rectal cancer s/p surgery with colostomy, HTN, GERD, depression, COPD, recurrent UTIs.   Clinical Impression  Pt admitted with above diagnosis. Pleasant and eager to work with physical therapy this afternoon. She is hopeful to regain her strength and independence. Currently living in a motel with a cousin that visits about every other day for approx 2 hours. Pt reports she was able to care for herself (bath/dress, ambulate with a rollator) PTA.  Cousin assists her with shopping and going to appointments. Patient reports she has been struggling more with ADLs lately. Currently requires up to CGA to mobilize safely. SpO2 90-92% on 1.5L while mobilizing, distance limited to 45 feet due to fatigue. Notices she is weaker than baseline. Is agreeable to SNF if she cannot progress to mod I level prior to d/c. Due to limited help/resources and decline in function I currently favor this venue to improve her strength and independence prior to returning to her motel. We will try to follow-up Monday to assess and progress. Pt currently with functional limitations due to the deficits listed below (see PT Problem List). Pt will benefit from acute skilled PT to increase their independence and safety with mobility to allow discharge.      Nasal cannula was lying in bed when I entered room; SpO2 84%. Applied and within 2 min SpO2 improved to >90%. Educated pt on need to keep applied, voiced understanding.     If plan is discharge home, recommend the following: A little help with walking and/or transfers;A little help with bathing/dressing/bathroom;Assistance with cooking/housework;Assist for transportation   Can travel  by private vehicle   Yes    Equipment Recommendations Rolling walker (2 wheels)  Recommendations for Other Services  OT consult    Functional Status Assessment Patient has had a recent decline in their functional status and demonstrates the ability to make significant improvements in function in a reasonable and predictable amount of time.     Precautions / Restrictions Precautions Precautions: Fall Recall of Precautions/Restrictions: Intact Precaution/Restrictions Comments: monitor O2 Restrictions Weight Bearing Restrictions Per Provider Order: No      Mobility  Bed Mobility Overal bed mobility: Needs Assistance Bed Mobility: Supine to Sit, Sit to Supine     Supine to sit: Supervision Sit to supine: Supervision   General bed mobility comments: Supervision for safety, cues for awareness and safety with lines/leads.    Transfers Overall transfer level: Needs assistance Equipment used: Rolling walker (2 wheels) Transfers: Sit to/from Stand Sit to Stand: Supervision           General transfer comment: Supervision for safety, slow to rise, minor instability, takes a moment to adjust with RW for support.    Ambulation/Gait Ambulation/Gait assistance: Contact guard assist Gait Distance (Feet): 45 Feet Assistive device: Rolling walker (2 wheels) Gait Pattern/deviations: Step-to pattern, Decreased stride length, Shuffle, Antalgic, Drifts right/left Gait velocity: dec Gait velocity interpretation: <1.31 ft/sec, indicative of household ambulator   General Gait Details: Educated on safe AD use with RW for support. Minor instability present but no overt LOB or buckling. Moderately antalgic and rigid. SpO2 90-92% on 2L supplemental O2. Cues for safety, awareness, and symmetry. CGA.  Stairs  Wheelchair Mobility     Tilt Bed    Modified Rankin (Stroke Patients Only)       Balance Overall balance assessment: Needs assistance Sitting-balance  support: No upper extremity supported, Feet supported Sitting balance-Leahy Scale: Good     Standing balance support: Bilateral upper extremity supported, Reliant on assistive device for balance Standing balance-Leahy Scale: Poor                               Pertinent Vitals/Pain Pain Assessment Pain Assessment: Faces Faces Pain Scale: Hurts little more Pain Location: colostomy, buttocks Pain Descriptors / Indicators: Burning, Sore Pain Intervention(s): Monitored during session, Repositioned    Home Living Family/patient expects to be discharged to:: Private residence Living Arrangements: Alone Available Help at Discharge: Family (cousin) Type of Home: Other(Comment) (Motel) Home Access: Level entry       Home Layout: One level Home Equipment: Rollator (4 wheels) Additional Comments: Staying in a motel. Has a cousin that helps for about 2 hours every other day; takes her shopping for groceries.    Prior Function Prior Level of Function : Independent/Modified Independent             Mobility Comments: Reports independent in her motel room using rollator.  Holds onto her cousin when leaving motel, does not take her rollator. ADLs Comments: Reports independent in her motel room but it is effortful to care for herself and has becoming increasingly difficult lately.     Extremity/Trunk Assessment   Upper Extremity Assessment Upper Extremity Assessment: Defer to OT evaluation    Lower Extremity Assessment Lower Extremity Assessment: Generalized weakness;Difficult to assess due to impaired cognition       Communication   Communication Communication: No apparent difficulties    Cognition Arousal: Alert Behavior During Therapy: WFL for tasks assessed/performed   PT - Cognitive impairments: No family/caregiver present to determine baseline, Attention, Safety/Judgement                         Following commands: Intact       Cueing  Cueing Techniques: Verbal cues, Gestural cues     General Comments General comments (skin integrity, edema, etc.): Nasal cannula was not in place when I entered room; SpO2 84%. Applied and within 2 min SpO2 improved to >90%. Educated pt on need to keep applied, voiced understanding. BP 119/68 HR 60.    Exercises     Assessment/Plan    PT Assessment Patient needs continued PT services  PT Problem List Decreased strength;Decreased range of motion;Decreased activity tolerance;Decreased mobility;Decreased balance;Decreased cognition;Decreased knowledge of use of DME;Decreased safety awareness;Cardiopulmonary status limiting activity;Pain       PT Treatment Interventions DME instruction;Gait training;Functional mobility training;Therapeutic activities;Therapeutic exercise;Balance training;Neuromuscular re-education;Cognitive remediation;Patient/family education    PT Goals (Current goals can be found in the Care Plan section)  Acute Rehab PT Goals Patient Stated Goal: Get stronger, take care of myself again PT Goal Formulation: With patient Time For Goal Achievement: 01/16/24 Potential to Achieve Goals: Good    Frequency Min 1X/week     Co-evaluation               AM-PAC PT "6 Clicks" Mobility  Outcome Measure Help needed turning from your back to your side while in a flat bed without using bedrails?: None Help needed moving from lying on your back to sitting on the side of a flat bed without using  bedrails?: A Little Help needed moving to and from a bed to a chair (including a wheelchair)?: A Little Help needed standing up from a chair using your arms (e.g., wheelchair or bedside chair)?: A Little Help needed to walk in hospital room?: A Little Help needed climbing 3-5 steps with a railing? : A Lot 6 Click Score: 18    End of Session Equipment Utilized During Treatment: Gait belt;Oxygen Activity Tolerance: Patient tolerated treatment well Patient left: in bed;with call  bell/phone within reach;with bed alarm set   PT Visit Diagnosis: Unsteadiness on feet (R26.81);Other abnormalities of gait and mobility (R26.89);Muscle weakness (generalized) (M62.81);Difficulty in walking, not elsewhere classified (R26.2);Pain Pain - part of body:  (buttock and colostomy site)    Time: 1610-9604 PT Time Calculation (min) (ACUTE ONLY): 27 min   Charges:   PT Evaluation $PT Eval Low Complexity: 1 Low PT Treatments $Gait Training: 8-22 mins PT General Charges $$ ACUTE PT VISIT: 1 Visit         Kathlyn [place], PT, DPT [facility]  Re[facility] Services Physical Therapist Office: [phone] Website: [email or web address]   Berton Mount 01/02/2024, 2:14 PM

## 2024-01-02 NOTE — Consult Note (Signed)
 WOC Nurse ostomy consult note Stoma type/location: LLQ colostomy Stomal assessment/size: 2 1/4" pink and moist Peristomal assessment:  intact Treatment options for stomal/peristomal skin: 2 piece pouch  Output soft brown stool Ostomy pouching: 2pc.  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  Fill wound depth with VASHE moist kerlix and secure with ABD pad and tape.  Change daily  Will not follow at this time.  Please re-consult if needed.  Mike Gip MSN, RN, FNP-BC CWON Wound, Ostomy, Continence Nurse Outpatient [facility] [phone] Pager [phone]

## 2024-01-02 NOTE — Plan of Care (Signed)

## 2024-01-03 DIAGNOSIS — N179 Acute kidney failure, unspecified: Secondary | ICD-10-CM | POA: Diagnosis not present

## 2024-01-03 LAB — BASIC METABOLIC PANEL
Anion gap: 9 (ref 5–15)
BUN: 33 mg/dL — ABNORMAL HIGH (ref 6–20)
CO2: 20 mmol/L — ABNORMAL LOW (ref 22–32)
Calcium: 9 mg/dL (ref 8.9–10.3)
Chloride: 106 mmol/L (ref 98–111)
Creatinine, Ser: 1.75 mg/dL — ABNORMAL HIGH (ref 0.44–1.00)
GFR, Estimated: 34 mL/min — ABNORMAL LOW (ref >60)
Glucose, Bld: 110 mg/dL — ABNORMAL HIGH (ref 70–99)
Potassium: 4.7 mmol/L (ref 3.5–5.1)
Sodium: 135 mmol/L (ref 135–145)

## 2024-01-03 LAB — MAGNESIUM: Magnesium: 2 mg/dL (ref 1.7–2.4)

## 2024-01-03 LAB — CBC WITH DIFFERENTIAL/PLATELET
Abs Immature Granulocytes: 0 10*3/uL (ref 0.00–0.07)
Basophils Absolute: 0 10*3/uL (ref 0.0–0.1)
Basophils Relative: 0 %
Eosinophils Absolute: 2.8 10*3/uL — ABNORMAL HIGH (ref 0.0–0.5)
Eosinophils Relative: 32 %
HCT: 33.3 % — ABNORMAL LOW (ref 36.0–46.0)
Hemoglobin: 10.6 g/dL — ABNORMAL LOW (ref 12.0–15.0)
Lymphocytes Relative: 9 %
Lymphs Abs: 0.8 10*3/uL (ref 0.7–4.0)
MCH: 27.5 pg (ref 26.0–34.0)
MCHC: 31.8 g/dL (ref 30.0–36.0)
MCV: 86.5 fL (ref 80.0–100.0)
Monocytes Absolute: 0.2 10*3/uL (ref 0.1–1.0)
Monocytes Relative: 2 %
Neutro Abs: 5 10*3/uL (ref 1.7–7.7)
Neutrophils Relative %: 57 %
Platelets: 186 10*3/uL (ref 150–400)
RBC: 3.85 MIL/uL — ABNORMAL LOW (ref 3.87–5.11)
RDW: 18.5 % — ABNORMAL HIGH (ref 11.5–15.5)
WBC: 8.7 10*3/uL (ref 4.0–10.5)
nRBC: 0 /100{WBCs}
nRBC: 0.2 % (ref 0.0–0.2)

## 2024-01-03 LAB — PROCALCITONIN: Procalcitonin: 0.9 ng/mL

## 2024-01-03 LAB — TSH: TSH: 2.91 u[IU]/mL (ref 0.350–4.500)

## 2024-01-03 LAB — BRAIN NATRIURETIC PEPTIDE: B Natriuretic Peptide: 308.7 pg/mL — ABNORMAL HIGH (ref 0.0–100.0)

## 2024-01-03 LAB — C-REACTIVE PROTEIN: CRP: 3.1 mg/dL — ABNORMAL HIGH (ref <1.0)

## 2024-01-03 LAB — T4, FREE: Free T4: 0.74 ng/dL (ref 0.61–1.12)

## 2024-01-03 MED ORDER — HYDRALAZINE HCL 50 MG PO TABS
100.0000 mg | ORAL_TABLET | Freq: Three times a day (TID) | ORAL | Status: DC
  Administered 2024-01-03 – 2024-01-12 (×19): 100 mg via ORAL
  Filled 2024-01-03 (×22): qty 2

## 2024-01-03 MED ORDER — LACTATED RINGERS IV SOLN: INTRAVENOUS | Status: AC

## 2024-01-03 MED ORDER — CARVEDILOL 12.5 MG PO TABS
12.5000 mg | ORAL_TABLET | Freq: Two times a day (BID) | ORAL | Status: DC
Start: 2024-01-03 — End: 2024-01-03
  Administered 2024-01-03: 12.5 mg via ORAL
  Filled 2024-01-03: qty 1

## 2024-01-03 MED ORDER — DIPHENHYDRAMINE HCL 25 MG PO CAPS
25.0000 mg | ORAL_CAPSULE | Freq: Four times a day (QID) | ORAL | Status: DC | PRN
  Administered 2024-01-03 – 2024-01-08 (×5): 25 mg via ORAL
  Filled 2024-01-03 (×5): qty 1

## 2024-01-03 NOTE — TOC Initial Note (Signed)
 Transition of Care [facility]) - Initial/Assessment Note   Patient Details  Name: Sherry Baker MRN: [id number] Date of Birth: Nov 30, 1965  [facility] CM/SW Contact:    Helene Kelp, LCSW Phone Number: 01/03/2024, 11:29 AM  Clinical Narrative:                 CSW followed-up disposition recommendations (SNF placement).  CSW completed initial TOC work-up/assessment as noted by the following below.   CSW attempted to speak with the patient, but at the time of the attempt the pt was tired and SNF disposition preference was not obtained.   The CSW contacted the patient's natural support [employer] [phone]) and was not able to reach her at the time of phone call attempt.   CSW referral efforts to support the patient's disposition FL2:  SNF referrals:   Note: PASRR: was not able to be obtained after several attempts. Please CSW follow this up on weekday times.   TOC Disposition follow-up needs  Please provide the patient or natural support with bed-offer updates.  Please continue with SNF placement efforts. Please update the clinical team to SNF placement efforts:  No other needs identified by this Clinical research associate currently. Patient needs and current disposition to be followed by    Expected Discharge Plan: Skilled Nursing Facility Barriers to Discharge: Continued Medical Work up   Patient Goals and CMS Choice     Choice offered to / list presented to : Patient, Adult Children      Expected Discharge Plan and Services     Post Acute Care Choice: Skilled Nursing Facility Living arrangements for the past 2 months: Single Family Home                                      Prior Living Arrangements/Services Living arrangements for the past 2 months: Single Family Home Lives with:: Self   Do you feel safe going back to the place where you live?: Yes      Need for Family Participation in Patient Care: Yes (Comment) Care giver support system in place?:  Yes (comment)   Criminal Activity/Legal Involvement Pertinent to Current Situation/Hospitalization: No - Comment as needed  Activities of Daily Living      Permission Sought/Granted Permission sought to share information with : Case Manager    Share Information with NAME: Crystl Theatre manager     Permission granted to share info w Relationship: Daughter     Emotional Assessment       Orientation: : Oriented to Self, Oriented to Place, Oriented to  Time, Oriented to Situation   Psych Involvement: No (comment)  Admission diagnosis:  ARF (acute renal failure) (HCC) [N17.9] AKI (acute kidney injury) (HCC) [N17.9] Acute kidney injury (HCC) [N17.9] Urinary tract infection without hematuria, site unspecified [N39.0] 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   History of colon cancer    Normocytic anemia    Metabolic acidemia    Acquired hypothyroidism  HTN (hypertension)    Right hip pain causing ambulatory dysfunction 11/26/2021   CKD (chronic kidney disease), stage III (HCC) 11/25/2021   Physical deconditioning    Ambulatory dysfunction    Protein-calorie malnutrition, severe 06/25/2021   Homelessness 06/23/2021   Alcoholic cirrhosis of liver without ascites (HCC) 06/23/2021   Alcohol abuse 06/14/2019   Pressure injury of skin 06/06/2019   Genetic testing 03/24/2017   GERD (gastroesophageal reflux disease) 02/18/2017   Rectal cancer (HCC) 01/27/2017   Anemia 01/16/2017   Tobacco abuse    Bipolar affective disorder (HCC)    Substance  abuse (HCC) 02/11/2014   Depression 02/11/2014   Thrombocytopenia (HCC) 02/11/2014   PCP:  Storm Frisk, MD Pharmacy:   [id number] - [place], [place] - [address] AT [facility] OF [address] & [address] [place] [address] Phone: [phone] Fax: [phone]  [facility] [address] [place] [address] Phone: [phone] Fax: [phone]     Social Drivers of Health (SDOH) Social History: SDOH Screenings   Food Insecurity: Food Insecurity Present (10/12/2023)  Housing: Medium Risk (10/12/2023)  Transportation Needs: Unmet Transportation Needs (10/12/2023)  Utilities: Not At Risk (10/12/2023)  Depression (PHQ2-9): High Risk (12/16/2023)  Financial Resource Strain: Low Risk  (10/12/2023)  Physical Activity: Inactive (10/12/2023)  Social Connections: Socially Isolated (10/12/2023)  Stress: Stress Concern Present (10/12/2023)  Tobacco Use: High Risk (12/16/2023)  Health Literacy: Adequate Health Literacy (10/12/2023)   SDOH Interventions:     Readmission Risk Interventions    06/16/2022    4:54 PM 04/17/2022   11:21 AM 12/10/2021    1:58 PM  Readmission Risk Prevention Plan  Transportation Screening Complete Complete Complete  Medication Review Oceanographer)  Complete Complete  PCP or Specialist appointment within 3-5 days of discharge Not Complete Complete Complete  PCP/Specialist Appt Not Complete comments patient needs to make apt    HRI or Home Care Consult Complete Complete Not Complete  HRI or Home Care Consult Pt Refusal Comments   history of unsafe home environment  SW Recovery Care/Counseling Consult  Complete Complete  Palliative Care Screening  Not Applicable Not Applicable  Skilled Nursing Facility Patient Refused Patient Refused Not Applicable

## 2024-01-03 NOTE — Plan of Care (Signed)

## 2024-01-03 NOTE — TOC Progression Note (Signed)
 Transition of Care [facility]) - Progression Note    Patient Details  Name: Sherry Baker MRN: [id number] Date of Birth: 07-01-1966  Transition of Care [facility]) CM/SW Contact  Donnalee Curry, LCSWA Phone Number: 01/03/2024, 3:04 PM  Clinical Narrative:     SW received message from MD/RN pt's family requesting to speak.   SW spoke with Hortense Ramal [phone]) she is the adoptive mother of pt's child. Deirdra reports pt is currently living in a motel (has paid for the month). Agreeable to rehab if able to find a location that is accepting of insurance. If pt must d/c home, requests appropriate DME be set up. Pt's cousin Aggie Cosier is primary contact but if no one is able to reach her, Hortense Ramal can be called and vice versa. SW extended the search for SNF to [facility] and [facility]. Deirdra requested someone f/u with her for bed offers.     Expected Discharge Plan: Skilled Nursing Facility Barriers to Discharge: Continued Medical Work up  Expected Discharge Plan and Services     Post Acute Care Choice: Skilled Nursing Facility Living arrangements for the past 2 months: Single Family Home                                       Social Determinants of Health (SDOH) Interventions SDOH Screenings   Food Insecurity: Food Insecurity Present (10/12/2023)  Housing: Medium Risk (10/12/2023)  Transportation Needs: Unmet Transportation Needs (10/12/2023)  Utilities: Not At Risk (10/12/2023)  Depression (PHQ2-9): High Risk (12/16/2023)  Financial Resource Strain: Low Risk  (10/12/2023)  Physical Activity: Inactive (10/12/2023)  Social Connections: Socially Isolated (10/12/2023)  Stress: Stress Concern Present (10/12/2023)  Tobacco Use: High Risk (12/16/2023)  Health Literacy: Adequate Health Literacy (10/12/2023)    Readmission Risk Interventions    06/16/2022    4:54 PM 04/17/2022   11:21 AM 12/10/2021    1:58 PM  Readmission Risk Prevention Plan  Transportation Screening Complete Complete  Complete  Medication Review (RN Care Manager)  Complete Complete  PCP or Specialist appointment within 3-5 days of discharge Not Complete Complete Complete  PCP/Specialist Appt Not Complete comments patient needs to make apt    HRI or Home Care Consult Complete Complete Not Complete  HRI or Home Care Consult Pt Refusal Comments   history of unsafe home environment  SW Recovery Care/Counseling Consult  Complete Complete  Palliative Care Screening  Not Applicable Not Applicable  Skilled Nursing Facility Patient Refused Patient Refused Not Applicable

## 2024-01-03 NOTE — NC FL2 (Signed)
 [place] MEDICAID FL2 LEVEL OF CARE FORM     IDENTIFICATION  Patient Name: Sherry Baker Birthdate: 08-08-1966 Sex: female Admission Date (Current Location): 12/31/2023  [facility] and [place] Number:  Producer, television/film/video and Address:  [facility], [address], [place], [place] [address]      Provider Number: [id number]  Attending Physician Name and Address:  Leroy Sea, MD  Relative Name and Phone Number:       Current Level of Care: Hospital Recommended Level of Care: Skilled Nursing Facility Prior Approval Number:    Date Approved/Denied: 01/03/24 PASRR Number:    Discharge Plan: SNF    Current Diagnoses: 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   History of colon cancer    Normocytic anemia    Metabolic acidemia    Acquired hypothyroidism    HTN (hypertension)    Right hip pain causing ambulatory dysfunction 11/26/2021   CKD (chronic kidney disease), stage III (HCC) 11/25/2021   Physical deconditioning    Ambulatory dysfunction    Protein-calorie malnutrition, severe 06/25/2021   Homelessness 06/23/2021   Alcoholic cirrhosis of liver without ascites (HCC) 06/23/2021   Alcohol abuse 06/14/2019   Pressure injury of skin 06/06/2019   Genetic testing 03/24/2017   GERD (gastroesophageal reflux disease) 02/18/2017   Rectal cancer (HCC) 01/27/2017   Anemia 01/16/2017   Tobacco abuse    Bipolar affective disorder (HCC)    Substance abuse (HCC) 02/11/2014   Depression 02/11/2014    Thrombocytopenia (HCC) 02/11/2014    Orientation RESPIRATION BLADDER Height & Weight     Self, Time, Situation  Normal Continent Weight: 112 lb 10.5 oz (51.1 kg) Height:  5\' 2"  (157.5 cm)  BEHAVIORAL SYMPTOMS/MOOD NEUROLOGICAL BOWEL NUTRITION STATUS      Continent Diet  AMBULATORY STATUS COMMUNICATION OF NEEDS Skin   Limited Assist Verbally                         Personal Care Assistance Level of Assistance  Dressing     Dressing Assistance: Limited assistance     Functional Limitations Info             SPECIAL CARE FACTORS FREQUENCY  OT (By licensed OT), PT (By licensed PT)     PT Frequency: 5x OT Frequency: 3x            Contractures      Additional Factors Info  Code Status Code Status Info: Full             Current Medications (01/03/2024):  This is the current hospital active medication list Current Facility-Administered Medications  Medication Dose Route Frequency Provider Last Rate Last Admin   acetaminophen (TYLENOL) tablet 650 mg  650 mg Oral Q6H PRN Mdala-Gausi, Masiku Agatha, MD   650 mg at 01/01/24 2025   albuterol (PROVENTIL) (2.5 MG/3ML) 0.083% nebulizer solution 2.5 mg  2.5 mg Inhalation Q6H PRN Eduard Clos, MD       amLODipine (NORVASC) tablet 10 mg  10 mg Oral Daily Eduard Clos, MD   10 mg at 01/03/24 0925   enoxaparin (LOVENOX) injection 30 mg  30 mg Subcutaneous Q24H Eduard Clos, MD   30 mg at 01/02/24 1405   feeding supplement (ENSURE ENLIVE / ENSURE PLUS) liquid 237 mL  237 mL Oral TID BM Eduard Clos, MD   237 mL at 01/03/24 1105   hydrALAZINE (APRESOLINE) tablet 100 mg  100 mg Oral Q8H Leroy Sea, MD       lactated ringers infusion   Intravenous Continuous Leroy Sea, MD 50 mL/hr at 01/03/24 1212 New Bag at 01/03/24 1212   meropenem (MERREM) 500 mg in sodium chloride 0.9 % 100 mL IVPB  500 mg Intravenous Q12H Eduard Clos, MD 200 mL/hr at 01/03/24 0925 500 mg at 01/03/24 0925    ondansetron (ZOFRAN) injection 4 mg  4 mg Intravenous Q6H PRN Mdala-Gausi, Masiku Leighton Roach, MD   4 mg at 01/02/24 1824   oxyCODONE (Oxy IR/ROXICODONE) immediate release tablet 5 mg  5 mg Oral Q6H PRN Mdala-Gausi, Gwenette Greet, MD   5 mg at 01/03/24 0929   pantoprazole (PROTONIX) EC tablet 40 mg  40 mg Oral Daily Eduard Clos, MD   40 mg at 01/03/24 0925   QUEtiapine (SEROQUEL) tablet 50 mg  50 mg Oral BID Eduard Clos, MD   50 mg at 01/03/24 1610   sertraline (ZOLOFT) tablet 50 mg  50 mg Oral Daily Eduard Clos, MD   50 mg at 01/03/24 9604   umeclidinium-vilanterol (ANORO ELLIPTA) 62.5-25 MCG/ACT 1 puff  1 puff Inhalation Daily Eduard Clos, MD   1 puff at 01/03/24 5409     Discharge Medications: Please see discharge summary for a list of discharge medications.  Relevant Imaging Results:  Relevant Lab Results:   Additional Information SSN: [id number]  Helene Kelp, [place]

## 2024-01-03 NOTE — Evaluation (Signed)
 Physical Therapy Evaluation Patient Details Name: Sherry Baker MRN: [id number] DOB: [birth year] Today's Date: 01/03/2024  History of Present Illness  58 year old woman who presented 2/27 with nausea, vomiting. Dx: AKI, UA concerning for UTI.  Noted that recent urine cultures grew ESBL Klebsiella. PMH of rectal cancer s/p surgery with colostomy, HTN, GERD, depression, COPD, recurrent UTIs.  Clinical Impression  Pt making steady progress with mobility. Continues to need assistance for mobility and fatigues quickly. Working on balance issues as well. Has a chronic wound on buttocks making sitting in certain positions difficult. Obtained blue pressure relief cushion and placed in room. Patient will benefit from continued inpatient follow up therapy, <3 hours/day.        If plan is discharge home, recommend the following: A little help with walking and/or transfers;A little help with bathing/dressing/bathroom;Assistance with cooking/housework;Assist for transportation   Can travel by private vehicle   Yes    Equipment Recommendations Rolling walker (2 wheels)  Recommendations for Other Services       Functional Status Assessment       Precautions / Restrictions Precautions Precautions: Fall Recall of Precautions/Restrictions: Intact Precaution/Restrictions Comments: monitor O2 Restrictions Weight Bearing Restrictions Per Provider Order: No      Mobility  Bed Mobility               General bed mobility comments: Pt up in chair    Transfers Overall transfer level: Needs assistance Equipment used: Rollator (4 wheels) Transfers: Sit to/from Stand Sit to Stand: Supervision, Contact guard assist           General transfer comment: supervision on initial stand and CGA for repeated sit to stand due to fatigue    Ambulation/Gait Ambulation/Gait assistance: Contact guard assist Gait Distance (Feet): 90 Feet Assistive device: Rollator (4 wheels) Gait Pattern/deviations:  Step-to pattern, Decreased stride length, Shuffle, Antalgic, Drifts right/left, Decreased step length - right, Decreased step length - left Gait velocity: decr Gait velocity interpretation: <1.31 ft/sec, indicative of household ambulator   General Gait Details: Assist for safety and Armed forces training and education officer     Tilt Bed    Modified Rankin (Stroke Patients Only)       Balance Overall balance assessment: Needs assistance Sitting-balance support: No upper extremity supported, Feet supported Sitting balance-Leahy Scale: Good     Standing balance support: Bilateral upper extremity supported, Reliant on assistive device for balance Standing balance-Leahy Scale: Poor Standing balance comment: UE support             High level balance activites: Side stepping, Backward walking High Level Balance Comments: Min assist with bilateral hand held for balance activities             Pertinent Vitals/Pain Pain Assessment Pain Assessment: Faces Faces Pain Scale: Hurts little more Pain Location: buttocks Pain Descriptors / Indicators: Burning, Sore Pain Intervention(s): Limited activity within patient's tolerance, Repositioned    Home Living                          Prior Function                       Extremity/Trunk Assessment                Communication   Communication Communication: No apparent difficulties    Cognition Arousal: Alert Behavior During Therapy: [facility] for tasks assessed/performed  PT - Cognitive impairments: No family/caregiver present to determine baseline, Attention, Safety/Judgement                         Following commands: Intact       Cueing Cueing Techniques: Verbal cues, Gestural cues     General Comments General comments (skin integrity, edema, etc.): SpO2 > 90% on RA with amb    Exercises Other Exercises Other Exercises: Repeated sit to stand x 6   Assessment/Plan     PT Assessment    PT Problem List         PT Treatment Interventions      PT Goals (Current goals can be found in the Care Plan section)  Acute Rehab PT Goals Patient Stated Goal: Get stronger, take care of myself again    Frequency Min 1X/week     Co-evaluation               AM-PAC PT "6 Clicks" Mobility  Outcome Measure Help needed turning from your back to your side while in a flat bed without using bedrails?: None Help needed moving from lying on your back to sitting on the side of a flat bed without using bedrails?: A Little Help needed moving to and from a bed to a chair (including a wheelchair)?: A Little Help needed standing up from a chair using your arms (e.g., wheelchair or bedside chair)?: A Little Help needed to walk in hospital room?: A Little Help needed climbing 3-5 steps with a railing? : A Lot 6 Click Score: 18    End of Session   Activity Tolerance: Patient tolerated treatment well Patient left: with call bell/phone within reach;in chair   PT Visit Diagnosis: Unsteadiness on feet (R26.81);Other abnormalities of gait and mobility (R26.89);Muscle weakness (generalized) (M62.81);Difficulty in walking, not elsewhere classified (R26.2);Pain Pain - part of body:  (buttock)    Time: 1450-1510 PT Time Calculation (min) (ACUTE ONLY): 20 min   Charges:     PT Treatments $Gait Training: 8-22 mins PT General Charges $$ ACUTE PT VISIT: 1 Visit         [facility] PT Acute Rehabilitation Services Office [phone]   Angelina Ok [facility] 01/03/2024, 3:32 PM

## 2024-01-03 NOTE — Progress Notes (Signed)
 PROGRESS NOTE                                                                                                                                                                                                             Patient Demographics:    Sherry Baker, is a 58 y.o. female, DOB - Nov 18, 1965, ZOX:[id number]  Outpatient Primary MD for the patient is Storm Frisk, MD    LOS - 2  Admit date - 12/31/2023    Chief Complaint  Patient presents with   Abdominal Pain    Hypotensive       Brief Narrative (HPI from H&P)   58 year old woman with PMH of rectal cancer s/p surgery with colostomy, HTN, GERD, depression, COPD, recurrent UTIs who presented with nausea, vomiting.  Patient reported that she had just finished a course of antibiotics.  Presented with AKI, UA concerning for UTI.  Noted that recent urine cultures grew ESBL Klebsiella.  Patient was started on meropenem.    Subjective:   Patient in bed, appears comfortable, denies any headache, no fever, no chest pain or pressure, no shortness of breath , no abdominal pain. No focal weakness.   Assessment  & Plan :   AKI on CKD Baseline creatinine is 1.4-1.5. [place] prerenal due to nausea vomiting and dehydration improving with IV fluids continue continue gentle IV fluids 01/03/2024, no acute findings on renal ultrasound   ESBL Klebsiella UTI Urine cultures from 12/16/2023 grew ESBL Klebsiella pneumoniae.  Follow culture sensitivity. Patient was treated with nitrofurantoin as outpatient.  Still has symptoms continue meropenem.   Abdominal pain. Normal CT nonacute question due to UTI, treat UTI and monitor appears to be in no distress abdominal exam is benign   History of rectal cancer s/p colostomy -Continue colostomy care.   Nighttime sinus bradycardia with a 2-second pause on 01/02/2024.  Was on very high doses of Coreg, stopped, check TSH free T4, monitor on  telemetry.  HTN  - continue Norvasc, added hydralazine, stop Coreg due to above   COPD -Continue home Anoro Ellipta.   Depression.  No acute issues supportive care.  Chronic sacral decubitus ulcer present on admission kindly see nursing notes.  Supportive care no acute issues.      Condition - Extremely Guarded  Family Communication  : Patient's cousin crystal [phone] called 01/02/2024  and message left 10:20 AM.  Called 01/03/2024 at 10:08 AM message left.  Code Status :   Full  Consults  :  None  PUD Prophylaxis : PPI   Procedures  :     Renal US - 1. Slightly atrophic right kidney with cortical scarring but no hydronephrosis. Mildly echogenic right renal cortex consistent with medical renal disease. 2. Normal left kidney  CT - 1. No acute localizing process in the abdomen or pelvis. 2. Stable left-sided colostomy. No bowel obstruction. 3. Stable sacrococcygeal decubitus ulcer with erosive changes of the tip of the coccyx. 4. Stable diffuse bladder wall thickening. Correlate clinically for cystitis. 5. Stable chronic appearing reticular opacities in both lung bases. 6. 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 7. Small hiatal hernia. 8. Aortic atherosclerosis. Aortic Atherosclerosis (ICD10-I70.0).      Disposition Plan  :    Status is: Inpatient   DVT Prophylaxis  :    enoxaparin (LOVENOX) injection 30 mg Start: 01/01/24 1400    Lab Results  Component Value Date   PLT 186 01/03/2024    Diet :  Diet Order             Diet Heart Room service appropriate? Yes; Fluid consistency: Thin  Diet effective now                    Inpatient Medications  Scheduled Meds:  amLODipine  10 mg Oral Daily   enoxaparin (LOVENOX) injection  30 mg Subcutaneous Q24H   feeding supplement  237 mL Oral TID BM   hydrALAZINE  100 mg Oral Q8H   pantoprazole  40 mg Oral Daily   QUEtiapine  50 mg Oral BID   sertraline  50 mg Oral Daily    umeclidinium-vilanterol  1 puff Inhalation Daily   Continuous Infusions:  lactated ringers     meropenem (MERREM) IV 500 mg (01/03/24 0925)   PRN Meds:.acetaminophen, albuterol, ondansetron (ZOFRAN) IV, oxyCODONE    Objective:   Vitals:   01/02/24 1947 01/03/24 0000 01/03/24 0411 01/03/24 0757  BP: (!) 110/57 109/60 101/63   Pulse: 67 65    Resp: 18 17    Temp: 98.5 F (36.9 C) 98.3 F (36.8 C) 98.6 F (37 C)   TempSrc: Oral Oral Oral   SpO2: 98% 96%  92%  Weight:      Height:        Wt Readings from Last 3 Encounters:  01/01/24 51.1 kg  12/16/23 49.2 kg  11/16/23 47.6 kg     Intake/Output Summary (Last 24 hours) at 01/03/2024 1006 Last data filed at 01/03/2024 0414 Gross per 24 hour  Intake 720 ml  Output 1375 ml  Net -655 ml     Physical Exam  Awake Alert, No new F.N deficits, Normal affect [place].AT,PERRAL Supple Neck, No JVD,   Symmetrical Chest wall movement, Good air movement bilaterally, CTAB RRR,No Gallops,Rubs or new Murmurs,  +ve B.Sounds, Abd Soft, No tenderness, colostomy bag with brown stool No Cyanosis, Clubbing or edema     RN pressure injury documentation: Pressure Injury 02/03/22 Sacrum Stage 4 - Full thickness tissue loss with exposed bone, tendon or muscle. (Active)  02/03/22   Location: Sacrum  Location Orientation:   Staging: Stage 4 - Full thickness tissue loss with exposed bone, tendon or muscle.  Wound Description (Comments):   Present on Admission: Yes     Pressure Injury 03/06/22 Sacrum Stage 4 - Full thickness tissue loss with exposed bone,  tendon or muscle. (Active)  03/06/22 1900  Location: Sacrum  Location Orientation:   Staging: Stage 4 - Full thickness tissue loss with exposed bone, tendon or muscle.  Wound Description (Comments):   Present on Admission: Yes     Pressure Injury 04/09/22 Buttocks Right Stage 2 -  Partial thickness loss of dermis presenting as a shallow open injury with a red, pink wound bed without slough.  pink, red (Active)  04/09/22 0415  Location: Buttocks  Location Orientation: Right  Staging: Stage 2 -  Partial thickness loss of dermis presenting as a shallow open injury with a red, pink wound bed without slough.  Wound Description (Comments): pink, red  Present on Admission: Yes     Pressure Injury 04/09/22 Sacrum Upper Stage 3 -  Full thickness tissue loss. Subcutaneous fat may be visible but bone, tendon or muscle are NOT exposed. (Active)  04/09/22 0415  Location: Sacrum  Location Orientation: Upper  Staging: Stage 3 -  Full thickness tissue loss. Subcutaneous fat may be visible but bone, tendon or muscle are NOT exposed.  Wound Description (Comments):   Present on Admission: No     Pressure Injury 04/13/22 Buttocks Right Deep Tissue Pressure Injury - Purple or maroon localized area of discolored intact skin or blood-filled blister due to damage of underlying soft tissue from pressure and/or shear. (Active)  04/13/22 2042  Location: Buttocks  Location Orientation: Right  Staging: Deep Tissue Pressure Injury - Purple or maroon localized area of discolored intact skin or blood-filled blister due to damage of underlying soft tissue from pressure and/or shear.  Wound Description (Comments):   Present on Admission: No     Pressure Injury 04/13/22 Toe (Comment  which one) Anterior;Right Deep Tissue Pressure Injury - Purple or maroon localized area of discolored intact skin or blood-filled blister due to damage of underlying soft tissue from pressure and/or shear. (Active)  04/13/22 2042  Location: Toe (Comment  which one)  Location Orientation: Anterior;Right  Staging: Deep Tissue Pressure Injury - Purple or maroon localized area of discolored intact skin or blood-filled blister due to damage of underlying soft tissue from pressure and/or shear.  Wound Description (Comments):   Present on Admission:      Pressure Injury 06/11/22 Buttocks Left Stage 2 -  Partial thickness loss of  dermis presenting as a shallow open injury with a red, pink wound bed without slough. (Active)  06/11/22 1257  Location: Buttocks  Location Orientation: Left  Staging: Stage 2 -  Partial thickness loss of dermis presenting as a shallow open injury with a red, pink wound bed without slough.  Wound Description (Comments):   Present on Admission: No     Pressure Injury 06/11/22 Buttocks Right Stage 2 -  Partial thickness loss of dermis presenting as a shallow open injury with a red, pink wound bed without slough. (Active)  06/11/22 1258  Location: Buttocks  Location Orientation: Right  Staging: Stage 2 -  Partial thickness loss of dermis presenting as a shallow open injury with a red, pink wound bed without slough.  Wound Description (Comments):   Present on Admission:      Pressure Injury 10/26/22 Coccyx Mid Stage 4 - Full thickness tissue loss with exposed bone, tendon or muscle. (Active)  10/26/22 1430  Location: Coccyx  Location Orientation: Mid  Staging: Stage 4 - Full thickness tissue loss with exposed bone, tendon or muscle.  Wound Description (Comments):   Present on Admission: Yes     Pressure Injury  10/26/22 Knee Anterior;Left Deep Tissue Pressure Injury - Purple or maroon localized area of discolored intact skin or blood-filled blister due to damage of underlying soft tissue from pressure and/or shear. (Active)  10/26/22 1330  Location: Knee  Location Orientation: Anterior;Left  Staging: Deep Tissue Pressure Injury - Purple or maroon localized area of discolored intact skin or blood-filled blister due to damage of underlying soft tissue from pressure and/or shear.  Wound Description (Comments):   Present on Admission: Yes     Pressure Injury 10/28/22 Elbow Posterior;Right Stage 1 -  Intact skin with non-blanchable redness of a localized area usually over a bony prominence. (Active)  10/28/22 1235  Location: Elbow  Location Orientation: Posterior;Right  Staging: Stage 1 -   Intact skin with non-blanchable redness of a localized area usually over a bony prominence.  Wound Description (Comments):   Present on Admission: Yes     Pressure Injury 10/28/22 Elbow Left;Posterior Stage 1 -  Intact skin with non-blanchable redness of a localized area usually over a bony prominence. (Active)  10/28/22 1235  Location: Elbow  Location Orientation: Left;Posterior  Staging: Stage 1 -  Intact skin with non-blanchable redness of a localized area usually over a bony prominence.  Wound Description (Comments):   Present on Admission:      Pressure Injury 01/01/24 Coccyx Mid Deep Open Red Wound (Active)  01/01/24 1800  Location: Coccyx  Location Orientation: Mid  Staging:   Wound Description (Comments): Deep Open Red Wound  Present on Admission: Yes  Dressing Type Moist to dry;ABD 01/03/24 0028      Data Review:    Recent Labs  Lab 12/31/23 2017 01/01/24 0230 01/02/24 0613 01/03/24 0606  WBC 12.2* 11.2* 9.5 8.7  HGB 13.4 10.9* 11.0* 10.6*  HCT 42.9 33.9* 34.4* 33.3*  PLT 242 191 201 186  MCV 88.3 86.3 86.6 86.5  MCH 27.6 27.7 27.7 27.5  MCHC 31.2 32.2 32.0 31.8  RDW 18.2* 18.0* 18.3* 18.5*  LYMPHSABS  --  0.7  --  0.8  MONOABS  --  0.3  --  0.2  EOSABS  --  1.8*  --  2.8*  BASOSABS  --  0.0  --  0.0    Recent Labs  Lab 12/31/23 2017 01/01/24 0230 01/02/24 0613 01/03/24 0606  NA 133* 134* 136 135  K 4.5 4.1 4.4 4.7  CL 104 110 108 106  CO2 15* 15* 18* 20*  ANIONGAP 14 9 10 9   GLUCOSE 87 108* 112* 110*  BUN 30* 32* 32* 33*  CREATININE 2.22* 2.17* 1.91* 1.75*  AST 15 10*  --   --   ALT 10 8  --   --   ALKPHOS 67 56  --   --   BILITOT 0.3 0.4  --   --   ALBUMIN 2.8* 2.1*  --   --   CRP  --   --  8.2* 3.1*  PROCALCITON  --   --  1.64 0.90  BNP  --   --   --  308.7*  MG  --   --  1.7 2.0  PHOS  --   --  3.8  --   CALCIUM 8.7* 7.9* 9.0 9.0      Recent Labs  Lab 12/31/23 2017 01/01/24 0230 01/02/24 0613 01/03/24 0606  CRP  --   --  8.2*  3.1*  PROCALCITON  --   --  1.64 0.90  BNP  --   --   --  308.7*  MG  --   --  1.7 2.0  CALCIUM 8.7* 7.9* 9.0 9.0    --------------------------------------------------------------------------------------------------------------- Lab Results  Component Value Date   TRIG 176 (H) 11/03/2022    Lab Results  Component Value Date   HGBA1C 5.0 02/06/2022   No results for input(s): "TSH", "T4TOTAL", "FREET4", "T3FREE", "THYROIDAB" in the last 72 hours. No results for input(s): "VITAMINB12", "FOLATE", "FERRITIN", "TIBC", "IRON", "RETICCTPCT" in the last 72 hours. ------------------------------------------------------------------------------------------------------------------ Cardiac Enzymes No results for input(s): "CKMB", "TROPONINI", "MYOGLOBIN" in the last 168 hours.  Invalid input(s): "CK"  Micro Results Recent Results (from the past 240 hours)  Urine Culture     Status: Abnormal (Preliminary result)   Collection Time: 01/01/24 12:36 AM   Specimen: Urine, Clean Catch  Result Value Ref Range Status   Specimen Description URINE, CLEAN CATCH  Final   Special Requests NONE  Final   Culture (A)  Final    >=100,000 COLONIES/mL KLEBSIELLA PNEUMONIAE CONFIRMATION OF SUSCEPTIBILITIES IN PROGRESS Performed at [facility], [address] [place], [place] [address]    Report Status PENDING  Incomplete    Radiology Report US RENAL Result Date: 01/02/2024 CLINICAL DATA:  Acute kidney injury EXAM: RENAL / URINARY TRACT ULTRASOUND COMPLETE COMPARISON:  CT 12/31/2023 FINDINGS: Right Kidney: Renal measurements: 8.5 x 4 x 4 cm = volume: 69.5 mL. Lobulated contour with areas of cortical scarring and mild atrophy. Cortex slightly echogenic. No mass or hydronephrosis Left Kidney: Renal measurements: 12.1 x 5.6 x 4.2 cm = volume: 147.7 mL. Echogenicity within normal limits. No mass or hydronephrosis visualized. Bladder: Appears normal for degree of bladder distention. Other: None. IMPRESSION:  1. Slightly atrophic right kidney with cortical scarring but no hydronephrosis. Mildly echogenic right renal cortex consistent with medical renal disease. 2. Normal left kidney Electronically Signed   By: Jasmine Pang M.D.   On: 01/02/2024 15:41     Signature  -   Susa Raring M.D on 01/03/2024 at 10:06 AM   -  To page go to [email or web address]

## 2024-01-03 NOTE — Progress Notes (Signed)
 Notified by telemetry that pt HR briefly dropped to 37 with a 2.33 sec pause. Notified Dr. Lazarus Salines of this. Pt coreg dose reduced by half by Dr. Lazarus Salines.

## 2024-01-04 DIAGNOSIS — N179 Acute kidney failure, unspecified: Secondary | ICD-10-CM | POA: Diagnosis not present

## 2024-01-04 LAB — BASIC METABOLIC PANEL
Anion gap: 9 (ref 5–15)
BUN: 37 mg/dL — ABNORMAL HIGH (ref 6–20)
CO2: 24 mmol/L (ref 22–32)
Calcium: 8.7 mg/dL — ABNORMAL LOW (ref 8.9–10.3)
Chloride: 101 mmol/L (ref 98–111)
Creatinine, Ser: 1.81 mg/dL — ABNORMAL HIGH (ref 0.44–1.00)
GFR, Estimated: 32 mL/min — ABNORMAL LOW (ref >60)
Glucose, Bld: 97 mg/dL (ref 70–99)
Potassium: 5.1 mmol/L (ref 3.5–5.1)
Sodium: 134 mmol/L — ABNORMAL LOW (ref 135–145)

## 2024-01-04 LAB — CBC WITH DIFFERENTIAL/PLATELET
Abs Immature Granulocytes: 0 10*3/uL (ref 0.00–0.07)
Basophils Absolute: 0 10*3/uL (ref 0.0–0.1)
Basophils Relative: 0 %
Eosinophils Absolute: 2.4 10*3/uL — ABNORMAL HIGH (ref 0.0–0.5)
Eosinophils Relative: 24 %
HCT: 33.1 % — ABNORMAL LOW (ref 36.0–46.0)
Hemoglobin: 10.8 g/dL — ABNORMAL LOW (ref 12.0–15.0)
Lymphocytes Relative: 21 %
Lymphs Abs: 2.1 10*3/uL (ref 0.7–4.0)
MCH: 27.3 pg (ref 26.0–34.0)
MCHC: 32.6 g/dL (ref 30.0–36.0)
MCV: 83.8 fL (ref 80.0–100.0)
Monocytes Absolute: 0.7 10*3/uL (ref 0.1–1.0)
Monocytes Relative: 7 %
Neutro Abs: 4.8 10*3/uL (ref 1.7–7.7)
Neutrophils Relative %: 48 %
Platelets: 206 10*3/uL (ref 150–400)
RBC: 3.95 MIL/uL (ref 3.87–5.11)
RDW: 18.1 % — ABNORMAL HIGH (ref 11.5–15.5)
WBC: 10.1 10*3/uL (ref 4.0–10.5)
nRBC: 0 % (ref 0.0–0.2)
nRBC: 0 /100{WBCs}

## 2024-01-04 LAB — MAGNESIUM: Magnesium: 1.8 mg/dL (ref 1.7–2.4)

## 2024-01-04 LAB — URINE CULTURE: Culture: >=100000 — AB

## 2024-01-04 LAB — PROCALCITONIN: Procalcitonin: 0.54 ng/mL

## 2024-01-04 LAB — C-REACTIVE PROTEIN: CRP: 1.8 mg/dL — ABNORMAL HIGH (ref <1.0)

## 2024-01-04 LAB — PATHOLOGIST SMEAR REVIEW: Path Review: NORMAL

## 2024-01-04 LAB — BRAIN NATRIURETIC PEPTIDE: B Natriuretic Peptide: 159.3 pg/mL — ABNORMAL HIGH (ref 0.0–100.0)

## 2024-01-04 MED ORDER — HYDROCODONE-ACETAMINOPHEN 5-325 MG PO TABS
1.0000 | ORAL_TABLET | Freq: Four times a day (QID) | ORAL | Status: DC | PRN
  Administered 2024-01-04 – 2024-01-09 (×12): 1 via ORAL
  Filled 2024-01-04 (×12): qty 1

## 2024-01-04 MED ORDER — SODIUM ZIRCONIUM CYCLOSILICATE 10 G PO PACK
10.0000 g | PACK | Freq: Two times a day (BID) | ORAL | Status: AC
  Administered 2024-01-04 (×2): 10 g via ORAL
  Filled 2024-01-04 (×2): qty 1

## 2024-01-04 NOTE — TOC Progression Note (Addendum)
 Transition of Care [facility]) - Progression Note    Patient Details  Name: Sherry Baker MRN: [id number] Date of Birth: 15-Jan-1966  Transition of [facility]) CM/SW Contact  Mearl Latin, LCSW Phone Number: 01/04/2024, 10:32 AM  Clinical Narrative:    10:32am-No SNF bed offers. [facility] checking insurance benefits.   3:16 PM-[facility] liaison going to come assess patient tomorrow. CSW spoke with Deirdre and provided update.    Expected Discharge Plan: Skilled Nursing Facility Barriers to Discharge: Continued Medical Work up, English as a second language teacher, SNF Pending bed offer  Expected Discharge Plan and Services     Post Acute Care Choice: Skilled Nursing Facility Living arrangements for the past 2 months: Single Family Home                                       Social Determinants of Health (SDOH) Interventions SDOH Screenings   Food Insecurity: Patient Unable To Answer (01/03/2024)  Recent Concern: Food Insecurity - Food Insecurity Present (10/12/2023)  Housing: Patient Unable To Answer (01/03/2024)  Recent Concern: Housing - Medium Risk (10/12/2023)  Transportation Needs: Patient Unable To Answer (01/03/2024)  Recent Concern: Transportation Needs - Unmet Transportation Needs (10/12/2023)  Utilities: Patient Unable To Answer (01/03/2024)  Depression (PHQ2-9): High Risk (12/16/2023)  Financial Resource Strain: Low Risk  (10/12/2023)  Physical Activity: Inactive (10/12/2023)  Social Connections: Socially Isolated (10/12/2023)  Stress: Stress Concern Present (10/12/2023)  Tobacco Use: High Risk (12/16/2023)  Health Literacy: Adequate Health Literacy (10/12/2023)    Readmission Risk Interventions    06/16/2022    4:54 PM 04/17/2022   11:21 AM 12/10/2021    1:58 PM  Readmission Risk Prevention Plan  Transportation Screening Complete Complete Complete  Medication Review (RN Care Manager)  Complete Complete  PCP or Specialist appointment within 3-5 days of discharge Not Complete  Complete Complete  PCP/Specialist Appt Not Complete comments patient needs to make apt    HRI or Home Care Consult Complete Complete Not Complete  HRI or Home Care Consult Pt Refusal Comments   history of unsafe home environment  SW Recovery Care/Counseling Consult  Complete Complete  Palliative Care Screening  Not Applicable Not Applicable  Skilled Nursing Facility Patient Refused Patient Refused Not Applicable

## 2024-01-04 NOTE — Evaluation (Signed)
 Occupational Therapy Evaluation Patient Details Name: Sherry Baker MRN: [id number] DOB: 07-11-1966 Today's Date: 01/04/2024   History of Present Illness   58 year old woman who presented 2/27 with nausea, vomiting. Dx: AKI, UA concerning for UTI.  Noted that recent urine cultures grew ESBL Klebsiella. PMH of rectal cancer s/p surgery with colostomy, HTN, GERD, depression, COPD, recurrent UTIs.     Clinical Impressions Prior to this admission, patient living at a motel, and independent in her ADLs and walking with a rollator occasionally. Patient's cousin comes a couple of times a week to help with groceries and other IADLs. Currently patient with min A for ADLs and transfers. Patient able to complete peri-care and bathing sitting EOB and in standing, returning to bed at end of session. Patient would benefit from [facility] at discharge, but may progress past needing. OT will continue to follow acutely.     If plan is discharge home, recommend the following:   A little help with walking and/or transfers;A little help with bathing/dressing/bathroom;Assist for transportation     Functional Status Assessment   Patient has had a recent decline in their functional status and demonstrates the ability to make significant improvements in function in a reasonable and predictable amount of time.     Equipment Recommendations   None recommended by OT     Recommendations for Other Services         Precautions/Restrictions   Precautions Precautions: Fall Recall of Precautions/Restrictions: Intact Precaution/Restrictions Comments: monitor O2 Restrictions Weight Bearing Restrictions Per Provider Order: No     Mobility Bed Mobility Overal bed mobility: Needs Assistance Bed Mobility: Supine to Sit, Sit to Supine     Supine to sit: Supervision Sit to supine: Supervision        Transfers Overall transfer level: Needs assistance Equipment used: Rolling walker (2  wheels) Transfers: Sit to/from Stand Sit to Stand: Min assist           General transfer comment: Min A to complete x3      Balance Overall balance assessment: Needs assistance Sitting-balance support: No upper extremity supported, Feet supported Sitting balance-Leahy Scale: Good     Standing balance support: Bilateral upper extremity supported, Reliant on assistive device for balance Standing balance-Leahy Scale: Poor Standing balance comment: UE support                           ADL either performed or assessed with clinical judgement   ADL Overall ADL's : Needs assistance/impaired Eating/Feeding: Set up;Sitting   Grooming: Set up;Sitting   Upper Body Bathing: Set up;Sitting   Lower Body Bathing: Minimal assistance;Sitting/lateral leans;Sit to/from stand   Upper Body Dressing : Set up;Sitting   Lower Body Dressing: Minimal assistance;Sit to/from stand;Sitting/lateral leans   Toilet Transfer: Minimal assistance;Ambulation;Rolling walker (2 wheels)   Toileting- Clothing Manipulation and Hygiene: Minimal assistance;Sitting/lateral lean;Sit to/from stand       Functional mobility during ADLs: Minimal assistance;Cueing for sequencing;Cueing for safety;Rolling walker (2 wheels) General ADL Comments: Prior to this admission, patient living at a motel, and independent in her ADLs and walking with a rollator occasionally. Patient's cousin comes a couple of times a week to help with groceries and other IADLs. Currently patient with min A for ADLs and transfers. Patient able to complete peri-care and bathing sitting EOB and in standing, returning to bed at end of session. Patient would benefit from [facility] at discharge, but may progress past needing. OT will continue to follow  acutely.     Vision Baseline Vision/History: 1 Wears glasses Ability to See in Adequate Light: 0 Adequate Patient Visual Report: No change from baseline Vision Assessment?: Wears glasses for  reading     Perception Perception: Not tested       Praxis Praxis: Not tested       Pertinent Vitals/Pain Pain Assessment Pain Assessment: Faces Faces Pain Scale: Hurts a little bit Pain Location: stomach Pain Descriptors / Indicators: Sore, Grimacing, Guarding Pain Intervention(s): Limited activity within patient's tolerance, Monitored during session, Repositioned     Extremity/Trunk Assessment Upper Extremity Assessment Upper Extremity Assessment: Right hand dominant;Generalized weakness   Lower Extremity Assessment Lower Extremity Assessment: Defer to PT evaluation   Cervical / Trunk Assessment Cervical / Trunk Assessment: Normal   Communication Communication Communication: No apparent difficulties   Cognition Arousal: Alert Behavior During Therapy: WFL for tasks assessed/performed Cognition: No apparent impairments                               Following commands: Intact       Cueing  General Comments   Cueing Techniques: Verbal cues;Gestural cues  VSS   Exercises     Shoulder Instructions      Home Living Family/patient expects to be discharged to:: Other (Comment) (Motel) Living Arrangements: Alone Available Help at Discharge:  (Cousin) Type of Home: Other(Comment) (Motel) Home Access: Level entry     Home Layout: One level     Bathroom Shower/Tub: Chief Strategy Officer: Standard Bathroom Accessibility: Yes   Home Equipment: Rollator (4 wheels)   Additional Comments: Staying in a motel. Has a cousin that helps for about 2 hours every other day; takes her shopping for groceries.      Prior Functioning/Environment Prior Level of Function : Independent/Modified Independent             Mobility Comments: Reports independent in her motel room using rollator.  Holds onto her cousin when leaving motel, does not take her rollator. ADLs Comments: Reports independent in her motel room but it is effortful to care for  herself and has becoming increasingly difficult lately.    OT Problem List: Decreased strength;Decreased activity tolerance;Impaired balance (sitting and/or standing);Decreased knowledge of use of DME or AE;Decreased knowledge of precautions;Pain   OT Treatment/Interventions: Self-care/ADL training;Therapeutic exercise;Energy conservation;DME and/or AE instruction;Manual therapy;Therapeutic activities;Patient/family education;Balance training      OT Goals(Current goals can be found in the care plan section)   Acute Rehab OT Goals Patient Stated Goal: to get better OT Goal Formulation: With patient Time For Goal Achievement: 01/18/24 Potential to Achieve Goals: Good ADL Goals Pt Will Perform Lower Body Bathing: with modified independence;sitting/lateral leans;sit to/from stand Pt Will Perform Lower Body Dressing: with modified independence;sit to/from stand;sitting/lateral leans Pt Will Transfer to Toilet: with modified independence;ambulating;regular height toilet Pt Will Perform Toileting - Clothing Manipulation and hygiene: with modified independence;sitting/lateral leans;sit to/from stand Pt/caregiver will Perform Home Exercise Program: Increased strength;Both right and left upper extremity;With written HEP provided;Independently   OT Frequency:  Min 2X/week    Co-evaluation              AM-PAC OT "6 Clicks" Daily Activity     Outcome Measure Help from another person eating meals?: A Little Help from another person taking care of personal grooming?: A Little Help from another person toileting, which includes using toliet, bedpan, or urinal?: A Little Help from another person  bathing (including washing, rinsing, drying)?: A Little Help from another person to put on and taking off regular upper body clothing?: A Little Help from another person to put on and taking off regular lower body clothing?: A Little 6 Click Score: 18   End of Session Equipment Utilized During  Treatment: Rolling walker (2 wheels) Nurse Communication: Mobility status  Activity Tolerance: Patient tolerated treatment well Patient left: in bed;with call bell/phone within reach;with bed alarm set  OT Visit Diagnosis: Unsteadiness on feet (R26.81);Other abnormalities of gait and mobility (R26.89);Muscle weakness (generalized) (M62.81);Pain                Time: 5462-7035 OT Time Calculation (min): 19 min Charges:  OT General Charges $OT Visit: 1 Visit OT Evaluation $OT Eval Moderate Complexity: 1 Mod  Pollyann Glen E. Dalonda Simoni, OTR/L Acute Rehabilitation Services [phone]   Cherlyn Cushing 01/04/2024, 4:09 PM

## 2024-01-04 NOTE — Progress Notes (Signed)
 Mobility Specialist Progress Note:    01/04/24 1217  Mobility  Activity Ambulated with assistance in hallway  Level of Assistance Contact guard assist, steadying assist  Assistive Device Four wheel walker  Distance Ambulated (ft) 150 ft  Activity Response Tolerated well  Mobility Referral Yes  Mobility visit 1 Mobility  Mobility Specialist Start Time (ACUTE ONLY) 0940  Mobility Specialist Stop Time (ACUTE ONLY) 0954  Mobility Specialist Time Calculation (min) (ACUTE ONLY) 14 min   Received pt in bed having c/o mild stomach pain but agreeable to mobility. Pt was asymptomatic throughout ambulation and returned to room w/o fault. Left in bed w/ call bell in reach and all needs met.   Thompson Grayer Mobility Specialist  Please contact vis Secure Chat or  Rehab Office [phone]

## 2024-01-04 NOTE — Progress Notes (Signed)
 PROGRESS NOTE                                                                                                                                                                                                             Patient Demographics:    Sherry Baker, is a 58 y.o. female, DOB - Dec 06, 1965, ZOX:[id number]  Outpatient Primary MD for the patient is Storm Frisk, MD    LOS - 3  Admit date - 12/31/2023    Chief Complaint  Patient presents with   Abdominal Pain    Hypotensive       Brief Narrative (HPI from H&P)   58 year old woman with PMH of rectal cancer s/p surgery with colostomy, HTN, GERD, depression, COPD, recurrent UTIs who presented with nausea, vomiting.  Patient reported that she had just finished a course of antibiotics.  Presented with AKI, UA concerning for UTI.  Noted that recent urine cultures grew ESBL Klebsiella.  Patient was started on meropenem.    Subjective:   Patient in bed, appears comfortable, denies any headache, no fever, no chest pain or pressure, no shortness of breath , no abdominal pain. No focal weakness.   Assessment  & Plan :   AKI on CKD Baseline creatinine is 1.4-1.5. Ackley prerenal due to nausea vomiting and dehydration improving with IV fluids continue continue gentle IV fluids 01/03/2024, no acute findings on renal ultrasound   ESBL Klebsiella UTI Urine cultures growing ESBL Klebsiella, continue meropenem for a total of 5 days, clinically better Failed outpatient nitrofurantoin treatment.   Abdominal pain. Normal CT nonacute question due to UTI, treat UTI and monitor appears to be in no distress abdominal exam is benign   History of rectal cancer s/p colostomy -Continue colostomy care.   Nighttime sinus bradycardia with a 2-second pause on 01/02/2024.  Was on very high doses of Coreg, stopped, check TSH free T4, monitor on telemetry.  HTN  - continue Norvasc, added  hydralazine, stop Coreg due to above   COPD -Continue home Anoro Ellipta.   Depression.  No acute issues supportive care.  Chronic sacral decubitus ulcer present on admission kindly see nursing notes.  Supportive care no acute issues.      Condition - Extremely Guarded  Family Communication  : Patient's cousin crystal [phone] called 01/02/2024 and message left 10:20 AM.  Called 01/03/2024 at 10:08 AM message left.  Called and updated cousin on the 06/22/2024.  Code Status :   Full  Consults  :  None  PUD Prophylaxis : PPI   Procedures  :     Renal US - 1. Slightly atrophic right kidney with cortical scarring but no hydronephrosis. Mildly echogenic right renal cortex consistent with medical renal disease. 2. Normal left kidney  CT - 1. No acute localizing process in the abdomen or pelvis. 2. Stable left-sided colostomy. No bowel obstruction. 3. Stable sacrococcygeal decubitus ulcer with erosive changes of the tip of the coccyx. 4. Stable diffuse bladder wall thickening. Correlate clinically for cystitis. 5. Stable chronic appearing reticular opacities in both lung bases. 6. 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 7. Small hiatal hernia. 8. Aortic atherosclerosis. Aortic Atherosclerosis (ICD10-I70.0).      Disposition Plan  :    Status is: Inpatient   DVT Prophylaxis  :    enoxaparin (LOVENOX) injection 30 mg Start: 01/01/24 1400    Lab Results  Component Value Date   PLT 206 01/04/2024    Diet :  Diet Order             Diet Heart Room service appropriate? Yes; Fluid consistency: Thin  Diet effective now                    Inpatient Medications  Scheduled Meds:  amLODipine  10 mg Oral Daily   enoxaparin (LOVENOX) injection  30 mg Subcutaneous Q24H   feeding supplement  237 mL Oral TID BM   hydrALAZINE  100 mg Oral Q8H   pantoprazole  40 mg Oral Daily   QUEtiapine  50 mg Oral BID   sertraline  50 mg Oral Daily   sodium zirconium  cyclosilicate  10 g Oral BID   umeclidinium-vilanterol  1 puff Inhalation Daily   Continuous Infusions:  meropenem (MERREM) IV 500 mg (01/04/24 0944)   PRN Meds:.acetaminophen, albuterol, diphenhydrAMINE, ondansetron (ZOFRAN) IV, oxyCODONE    Objective:   Vitals:   01/03/24 2010 01/04/24 0026 01/04/24 0339 01/04/24 0759  BP: 130/71 117/67 130/75   Pulse: 68 88 77   Resp: 12 12 17    Temp:  98.4 F (36.9 C) 98.2 F (36.8 C)   TempSrc:  Oral Oral   SpO2: 97% 94% 94% 95%  Weight:      Height:        Wt Readings from Last 3 Encounters:  01/01/24 51.1 kg  12/16/23 49.2 kg  11/16/23 47.6 kg     Intake/Output Summary (Last 24 hours) at 01/04/2024 1030 Last data filed at 01/04/2024 0400 Gross per 24 hour  Intake 100 ml  Output 1800 ml  Net -1700 ml     Physical Exam  Awake Alert, No new F.N deficits, Normal affect Wisdom.AT,PERRAL Supple Neck, No JVD,   Symmetrical Chest wall movement, Good air movement bilaterally, CTAB RRR,No Gallops,Rubs or new Murmurs,  +ve B.Sounds, Abd Soft, No tenderness, colostomy bag with brown stool No Cyanosis, Clubbing or edema     RN pressure injury documentation: Pressure Injury 02/03/22 Sacrum Stage 4 - Full thickness tissue loss with exposed bone, tendon or muscle. (Active)  02/03/22   Location: Sacrum  Location Orientation:   Staging: Stage 4 - Full thickness tissue loss with exposed bone, tendon or muscle.  Wound Description (Comments):   Present on Admission: Yes     Pressure Injury 03/06/22 Sacrum Stage 4 - Full thickness tissue loss  with exposed bone, tendon or muscle. (Active)  03/06/22 1900  Location: Sacrum  Location Orientation:   Staging: Stage 4 - Full thickness tissue loss with exposed bone, tendon or muscle.  Wound Description (Comments):   Present on Admission: Yes     Pressure Injury 04/09/22 Buttocks Right Stage 2 -  Partial thickness loss of dermis presenting as a shallow open injury with a red, pink wound bed  without slough. pink, red (Active)  04/09/22 0415  Location: Buttocks  Location Orientation: Right  Staging: Stage 2 -  Partial thickness loss of dermis presenting as a shallow open injury with a red, pink wound bed without slough.  Wound Description (Comments): pink, red  Present on Admission: Yes     Pressure Injury 04/09/22 Sacrum Upper Stage 3 -  Full thickness tissue loss. Subcutaneous fat may be visible but bone, tendon or muscle are NOT exposed. (Active)  04/09/22 0415  Location: Sacrum  Location Orientation: Upper  Staging: Stage 3 -  Full thickness tissue loss. Subcutaneous fat may be visible but bone, tendon or muscle are NOT exposed.  Wound Description (Comments):   Present on Admission: No     Pressure Injury 04/13/22 Buttocks Right Deep Tissue Pressure Injury - Purple or maroon localized area of discolored intact skin or blood-filled blister due to damage of underlying soft tissue from pressure and/or shear. (Active)  04/13/22 2042  Location: Buttocks  Location Orientation: Right  Staging: Deep Tissue Pressure Injury - Purple or maroon localized area of discolored intact skin or blood-filled blister due to damage of underlying soft tissue from pressure and/or shear.  Wound Description (Comments):   Present on Admission: No     Pressure Injury 04/13/22 Toe (Comment  which one) Anterior;Right Deep Tissue Pressure Injury - Purple or maroon localized area of discolored intact skin or blood-filled blister due to damage of underlying soft tissue from pressure and/or shear. (Active)  04/13/22 2042  Location: Toe (Comment  which one)  Location Orientation: Anterior;Right  Staging: Deep Tissue Pressure Injury - Purple or maroon localized area of discolored intact skin or blood-filled blister due to damage of underlying soft tissue from pressure and/or shear.  Wound Description (Comments):   Present on Admission:      Pressure Injury 06/11/22 Buttocks Left Stage 2 -  Partial  thickness loss of dermis presenting as a shallow open injury with a red, pink wound bed without slough. (Active)  06/11/22 1257  Location: Buttocks  Location Orientation: Left  Staging: Stage 2 -  Partial thickness loss of dermis presenting as a shallow open injury with a red, pink wound bed without slough.  Wound Description (Comments):   Present on Admission: No     Pressure Injury 06/11/22 Buttocks Right Stage 2 -  Partial thickness loss of dermis presenting as a shallow open injury with a red, pink wound bed without slough. (Active)  06/11/22 1258  Location: Buttocks  Location Orientation: Right  Staging: Stage 2 -  Partial thickness loss of dermis presenting as a shallow open injury with a red, pink wound bed without slough.  Wound Description (Comments):   Present on Admission:      Pressure Injury 10/26/22 Coccyx Mid Stage 4 - Full thickness tissue loss with exposed bone, tendon or muscle. (Active)  10/26/22 1430  Location: Coccyx  Location Orientation: Mid  Staging: Stage 4 - Full thickness tissue loss with exposed bone, tendon or muscle.  Wound Description (Comments):   Present on Admission: Yes  Pressure Injury 10/26/22 Knee Anterior;Left Deep Tissue Pressure Injury - Purple or maroon localized area of discolored intact skin or blood-filled blister due to damage of underlying soft tissue from pressure and/or shear. (Active)  10/26/22 1330  Location: Knee  Location Orientation: Anterior;Left  Staging: Deep Tissue Pressure Injury - Purple or maroon localized area of discolored intact skin or blood-filled blister due to damage of underlying soft tissue from pressure and/or shear.  Wound Description (Comments):   Present on Admission: Yes     Pressure Injury 10/28/22 Elbow Posterior;Right Stage 1 -  Intact skin with non-blanchable redness of a localized area usually over a bony prominence. (Active)  10/28/22 1235  Location: Elbow  Location Orientation: Posterior;Right   Staging: Stage 1 -  Intact skin with non-blanchable redness of a localized area usually over a bony prominence.  Wound Description (Comments):   Present on Admission: Yes     Pressure Injury 10/28/22 Elbow Left;Posterior Stage 1 -  Intact skin with non-blanchable redness of a localized area usually over a bony prominence. (Active)  10/28/22 1235  Location: Elbow  Location Orientation: Left;Posterior  Staging: Stage 1 -  Intact skin with non-blanchable redness of a localized area usually over a bony prominence.  Wound Description (Comments):   Present on Admission:      Pressure Injury 01/01/24 Coccyx Mid Deep Open Red Wound (Active)  01/01/24 1800  Location: Coccyx  Location Orientation: Mid  Staging:   Wound Description (Comments): Deep Open Red Wound  Present on Admission: Yes  Dressing Type Moist to dry 01/03/24 0820      Data Review:    Recent Labs  Lab 12/31/23 2017 01/01/24 0230 01/02/24 0613 01/03/24 0606 01/04/24 0506  WBC 12.2* 11.2* 9.5 8.7 10.1  HGB 13.4 10.9* 11.0* 10.6* 10.8*  HCT 42.9 33.9* 34.4* 33.3* 33.1*  PLT 242 191 201 186 206  MCV 88.3 86.3 86.6 86.5 83.8  MCH 27.6 27.7 27.7 27.5 27.3  MCHC 31.2 32.2 32.0 31.8 32.6  RDW 18.2* 18.0* 18.3* 18.5* 18.1*  LYMPHSABS  --  0.7  --  0.8 2.1  MONOABS  --  0.3  --  0.2 0.7  EOSABS  --  1.8*  --  2.8* 2.4*  BASOSABS  --  0.0  --  0.0 0.0    Recent Labs  Lab 12/31/23 2017 01/01/24 0230 01/02/24 0613 01/03/24 0606 01/04/24 0506  NA 133* 134* 136 135 134*  K 4.5 4.1 4.4 4.7 5.1  CL 104 110 108 106 101  CO2 15* 15* 18* 20* 24  ANIONGAP 14 9 10 9 9   GLUCOSE 87 108* 112* 110* 97  BUN 30* 32* 32* 33* 37*  CREATININE 2.22* 2.17* 1.91* 1.75* 1.81*  AST 15 10*  --   --   --   ALT 10 8  --   --   --   ALKPHOS 67 56  --   --   --   BILITOT 0.3 0.4  --   --   --   ALBUMIN 2.8* 2.1*  --   --   --   CRP  --   --  8.2* 3.1* 1.8*  PROCALCITON  --   --  1.64 0.90 0.54  TSH  --   --   --  2.910  --   BNP   --   --   --  308.7* 159.3*  MG  --   --  1.7 2.0 1.8  PHOS  --   --  3.8  --   --   CALCIUM 8.7* 7.9* 9.0 9.0 8.7*      Recent Labs  Lab 12/31/23 2017 01/01/24 0230 01/02/24 0613 01/03/24 0606 01/04/24 0506  CRP  --   --  8.2* 3.1* 1.8*  PROCALCITON  --   --  1.64 0.90 0.54  TSH  --   --   --  2.910  --   BNP  --   --   --  308.7* 159.3*  MG  --   --  1.7 2.0 1.8  CALCIUM 8.7* 7.9* 9.0 9.0 8.7*    --------------------------------------------------------------------------------------------------------------- Lab Results  Component Value Date   TRIG 176 (H) 11/03/2022    Lab Results  Component Value Date   HGBA1C 5.0 02/06/2022   Recent Labs    01/03/24 0606  TSH 2.910  FREET4 0.74   No results for input(s): "VITAMINB12", "FOLATE", "FERRITIN", "TIBC", "IRON", "RETICCTPCT" in the last 72 hours. ------------------------------------------------------------------------------------------------------------------ Cardiac Enzymes No results for input(s): "CKMB", "TROPONINI", "MYOGLOBIN" in the last 168 hours.  Invalid input(s): "CK"  Micro Results Recent Results (from the past 240 hours)  Urine Culture     Status: Abnormal   Collection Time: 01/01/24 12:36 AM   Specimen: Urine, Clean Catch  Result Value Ref Range Status   Specimen Description URINE, CLEAN CATCH  Final   Special Requests   Final    NONE Performed at [facility], [address] [place], [place] [address]    Culture (A)  Final    >=100,000 COLONIES/mL KLEBSIELLA PNEUMONIAE Confirmed Extended Spectrum Beta-Lactamase Producer (ESBL).  In bloodstream infections from ESBL organisms, carbapenems are preferred over piperacillin/tazobactam. They are shown to have a lower risk of mortality.    Report Status 01/04/2024 FINAL  Final   Organism ID, Bacteria KLEBSIELLA PNEUMONIAE (A)  Final      Susceptibility   Klebsiella pneumoniae - MIC*    AMPICILLIN >=32 RESISTANT Resistant     CEFAZOLIN >=64  RESISTANT Resistant     CEFEPIME >=32 RESISTANT Resistant     CEFTRIAXONE >=64 RESISTANT Resistant     CIPROFLOXACIN >=4 RESISTANT Resistant     GENTAMICIN >=16 RESISTANT Resistant     IMIPENEM <=0.25 SENSITIVE Sensitive     NITROFURANTOIN 128 RESISTANT Resistant     TRIMETH/SULFA >=320 RESISTANT Resistant     AMPICILLIN/SULBACTAM >=32 RESISTANT Resistant     PIP/TAZO 64 INTERMEDIATE Intermediate ug/mL    * >=100,000 COLONIES/mL KLEBSIELLA PNEUMONIAE    Radiology Report US RENAL Result Date: 01/02/2024 CLINICAL DATA:  Acute kidney injury EXAM: RENAL / URINARY TRACT ULTRASOUND COMPLETE COMPARISON:  CT 12/31/2023 FINDINGS: Right Kidney: Renal measurements: 8.5 x 4 x 4 cm = volume: 69.5 mL. Lobulated contour with areas of cortical scarring and mild atrophy. Cortex slightly echogenic. No mass or hydronephrosis Left Kidney: Renal measurements: 12.1 x 5.6 x 4.2 cm = volume: 147.7 mL. Echogenicity within normal limits. No mass or hydronephrosis visualized. Bladder: Appears normal for degree of bladder distention. Other: None. IMPRESSION: 1. Slightly atrophic right kidney with cortical scarring but no hydronephrosis. Mildly echogenic right renal cortex consistent with medical renal disease. 2. Normal left kidney Electronically Signed   By: Jasmine Pang M.D.   On: 01/02/2024 15:41     Signature  -   Susa Raring M.D on 01/04/2024 at 10:30 AM   -  To page go to [email or web address]

## 2024-01-05 DIAGNOSIS — N179 Acute kidney failure, unspecified: Secondary | ICD-10-CM | POA: Diagnosis not present

## 2024-01-05 LAB — CBC WITH DIFFERENTIAL/PLATELET
Abs Immature Granulocytes: 0 10*3/uL (ref 0.00–0.07)
Basophils Absolute: 0 10*3/uL (ref 0.0–0.1)
Basophils Relative: 0 %
Eosinophils Absolute: 2.4 10*3/uL — ABNORMAL HIGH (ref 0.0–0.5)
Eosinophils Relative: 24 %
HCT: 32.9 % — ABNORMAL LOW (ref 36.0–46.0)
Hemoglobin: 10.9 g/dL — ABNORMAL LOW (ref 12.0–15.0)
Lymphocytes Relative: 22 %
Lymphs Abs: 2.2 10*3/uL (ref 0.7–4.0)
MCH: 27.6 pg (ref 26.0–34.0)
MCHC: 33.1 g/dL (ref 30.0–36.0)
MCV: 83.3 fL (ref 80.0–100.0)
Monocytes Absolute: 1 10*3/uL (ref 0.1–1.0)
Monocytes Relative: 10 %
Neutro Abs: 4.4 10*3/uL (ref 1.7–7.7)
Neutrophils Relative %: 44 %
Platelets: 199 10*3/uL (ref 150–400)
RBC: 3.95 MIL/uL (ref 3.87–5.11)
RDW: 18 % — ABNORMAL HIGH (ref 11.5–15.5)
WBC: 10 10*3/uL (ref 4.0–10.5)
nRBC: 0.2 % (ref 0.0–0.2)

## 2024-01-05 LAB — BASIC METABOLIC PANEL
Anion gap: 8 (ref 5–15)
BUN: 41 mg/dL — ABNORMAL HIGH (ref 6–20)
CO2: 25 mmol/L (ref 22–32)
Calcium: 8.4 mg/dL — ABNORMAL LOW (ref 8.9–10.3)
Chloride: 100 mmol/L (ref 98–111)
Creatinine, Ser: 1.82 mg/dL — ABNORMAL HIGH (ref 0.44–1.00)
GFR, Estimated: 32 mL/min — ABNORMAL LOW (ref >60)
Glucose, Bld: 93 mg/dL (ref 70–99)
Potassium: 4.8 mmol/L (ref 3.5–5.1)
Sodium: 133 mmol/L — ABNORMAL LOW (ref 135–145)

## 2024-01-05 LAB — PROCALCITONIN: Procalcitonin: 0.34 ng/mL

## 2024-01-05 LAB — BRAIN NATRIURETIC PEPTIDE: B Natriuretic Peptide: 148.5 pg/mL — ABNORMAL HIGH (ref 0.0–100.0)

## 2024-01-05 LAB — MAGNESIUM: Magnesium: 1.9 mg/dL (ref 1.7–2.4)

## 2024-01-05 LAB — C-REACTIVE PROTEIN: CRP: 1.4 mg/dL — ABNORMAL HIGH (ref <1.0)

## 2024-01-05 NOTE — TOC Progression Note (Addendum)
 Transition of Care [facility]) - Progression Note    Patient Details  Name: Sherry Baker MRN: [id number] Date of Birth: 1966-02-09  Transition of Care [facility]) CM/SW Contact  Mearl Latin, LCSW Phone Number: 01/05/2024, 2:21 PM  Clinical Narrative:    2:21pm-[facility] moving forward to conduct asset search on patient prior to beginning insurance process. CSW left voicemail for Deirdre.   5:24 PM-[facility] starting insurance process.  Expected Discharge Plan: Skilled Nursing Facility Barriers to Discharge: Continued Medical Work up, English as a second language teacher, SNF Pending bed offer  Expected Discharge Plan and Services     Post Acute Care Choice: Skilled Nursing Facility Living arrangements for the past 2 months: Single Family Home                                       Social Determinants of Health (SDOH) Interventions SDOH Screenings   Food Insecurity: Patient Unable To Answer (01/03/2024)  Recent Concern: Food Insecurity - Food Insecurity Present (10/12/2023)  Housing: Patient Unable To Answer (01/03/2024)  Recent Concern: Housing - Medium Risk (10/12/2023)  Transportation Needs: Patient Unable To Answer (01/03/2024)  Recent Concern: Transportation Needs - Unmet Transportation Needs (10/12/2023)  Utilities: Patient Unable To Answer (01/03/2024)  Depression (PHQ2-9): High Risk (12/16/2023)  Financial Resource Strain: Low Risk  (10/12/2023)  Physical Activity: Inactive (10/12/2023)  Social Connections: Socially Isolated (10/12/2023)  Stress: Stress Concern Present (10/12/2023)  Tobacco Use: High Risk (12/16/2023)  Health Literacy: Adequate Health Literacy (10/12/2023)    Readmission Risk Interventions    06/16/2022    4:54 PM 04/17/2022   11:21 AM 12/10/2021    1:58 PM  Readmission Risk Prevention Plan  Transportation Screening Complete Complete Complete  Medication Review (RN Care Manager)  Complete Complete  PCP or Specialist appointment within 3-5 days of discharge Not  Complete Complete Complete  PCP/Specialist Appt Not Complete comments patient needs to make apt    HRI or Home Care Consult Complete Complete Not Complete  HRI or Home Care Consult Pt Refusal Comments   history of unsafe home environment  SW Recovery Care/Counseling Consult  Complete Complete  Palliative Care Screening  Not Applicable Not Applicable  Skilled Nursing Facility Patient Refused Patient Refused Not Applicable

## 2024-01-05 NOTE — Progress Notes (Signed)
 Physical Therapy Treatment Patient Details Name: Sherry Baker MRN: [id number] DOB: 04/22/1966 Today's Date: 01/05/2024   History of Present Illness 58 year old woman who presented 2/27 with nausea, vomiting. Dx: AKI, UA concerning for UTI.  Noted that recent urine cultures grew ESBL Klebsiella. PMH of rectal cancer s/p surgery with colostomy, HTN, GERD, depression, COPD, recurrent UTIs.    PT Comments  Gradually progressing towards functional goals. SpO2 92% and greater on room air throughout session today with no DOE. CGA for transfers (posterior lean bracing legs against bed to stabilize,) and 85' of gait tolerated with emphasis on upright stance and gait symmetry due to pronounced shuffle with reduced stride length. No overt LOB, pt prefers RW over rollator currently. Due to limited help at her motel, I still favor SNF to reduce fall risk and improve functional capacity and independence. If she does not get approved, would consider maximizing HH services including and aide and HHPT. Patient will continue to benefit from skilled physical therapy services to further improve independence with functional mobility.     If plan is discharge home, recommend the following: A little help with walking and/or transfers;A little help with bathing/dressing/bathroom;Assistance with cooking/housework;Assist for transportation   Can travel by private vehicle     Yes  Equipment Recommendations  Rolling walker (2 wheels)    Recommendations for Other Services OT consult     Precautions / Restrictions Precautions Precautions: Fall Recall of Precautions/Restrictions: Intact Precaution/Restrictions Comments: monitor O2 Restrictions Weight Bearing Restrictions Per Provider Order: No     Mobility  Bed Mobility Overal bed mobility: Needs Assistance Bed Mobility: Supine to Sit     Supine to sit: Supervision     General bed mobility comments: Supervision for safety, management of lines/leads, no  physical assist needed.    Transfers Overall transfer level: Needs assistance Equipment used: Rolling walker (2 wheels) Transfers: Sit to/from Stand Sit to Stand: Contact guard assist           General transfer comment: CGA for safety, leaning posteriorly, back of legs pressed heavily into bed to brace. cues for anterior weight shift and support of UEs on RW to stabilize.    Ambulation/Gait Ambulation/Gait assistance: Contact guard assist Gait Distance (Feet): 85 Feet Assistive device: Rolling walker (2 wheels) Gait Pattern/deviations: Decreased stride length, Shuffle, Antalgic, Drifts right/left, Decreased step length - right, Decreased step length - left, Step-through pattern Gait velocity: decr Gait velocity interpretation: <1.31 ft/sec, indicative of household ambulator   General Gait Details: CGA for safety, pt prefers RW over rollator for added support (has rollator at home, states it is too bulky to maneuver some places and very difficult to load into car.) No overt LOB. SpO2 92% and higher during session on room air. HR 70s. No dyspnea noted. Educated on symptom awareness, energy conservation, upright posture and gait symmetry due to pronounced shuffle.   Stairs             Wheelchair Mobility     Tilt Bed    Modified Rankin (Stroke Patients Only)       Balance Overall balance assessment: Needs assistance Sitting-balance support: No upper extremity supported, Feet supported Sitting balance-Leahy Scale: Good     Standing balance support: Bilateral upper extremity supported, Reliant on assistive device for balance Standing balance-Leahy Scale: Poor Standing balance comment: UE support                            Communication  Communication Communication: No apparent difficulties  Cognition Arousal: Alert Behavior During Therapy: WFL for tasks assessed/performed   PT - Cognitive impairments: No apparent impairments                          Following commands: Intact      Cueing Cueing Techniques: Verbal cues, Gestural cues  Exercises Other Exercises Other Exercises: Reviewed importance of scheduled weight shifting every 20-30 minutes while in chair. Use of geopad.    General Comments General comments (skin integrity, edema, etc.): SpO2 92% and greater throughout session on RA- no dyspnea.      Pertinent Vitals/Pain Pain Assessment Pain Assessment: Faces Faces Pain Scale: Hurts little more Pain Location: sacrum, hips Pain Descriptors / Indicators: Sore, Guarding Pain Intervention(s): Monitored during session, Premedicated before session, Repositioned, Limited activity within patient's tolerance    Home Living                          Prior Function            PT Goals (current goals can now be found in the care plan section) Acute Rehab PT Goals Patient Stated Goal: Get well PT Goal Formulation: With patient Time For Goal Achievement: 01/16/24 Potential to Achieve Goals: Good Progress towards PT goals: Progressing toward goals    Frequency    Min 1X/week      PT Plan      Co-evaluation              AM-PAC PT "6 Clicks" Mobility   Outcome Measure  Help needed turning from your back to your side while in a flat bed without using bedrails?: None Help needed moving from lying on your back to sitting on the side of a flat bed without using bedrails?: A Little Help needed moving to and from a bed to a chair (including a wheelchair)?: A Little Help needed standing up from a chair using your arms (e.g., wheelchair or bedside chair)?: A Little Help needed to walk in hospital room?: A Little Help needed climbing 3-5 steps with a railing? : A Lot 6 Click Score: 18    End of Session Equipment Utilized During Treatment: Gait belt Activity Tolerance: Patient tolerated treatment well Patient left: with call bell/phone within reach;in chair (geopad) Nurse Communication: Mobility  status PT Visit Diagnosis: Unsteadiness on feet (R26.81);Other abnormalities of gait and mobility (R26.89);Muscle weakness (generalized) (M62.81);Difficulty in walking, not elsewhere classified (R26.2);Pain Pain - part of body:  (buttock)     Time: 4332-9518 PT Time Calculation (min) (ACUTE ONLY): 23 min  Charges:    $Gait Training: 8-22 mins $Therapeutic Activity: 8-22 mins PT General Charges $$ ACUTE PT VISIT: 1 Visit                     Kathlyn Sacramento, PT, DPT [facility]  Rehabilitation Services Physical Therapist Office: [phone] Website: [email or web address]    Berton Mount 01/05/2024, 11:30 AM

## 2024-01-05 NOTE — Progress Notes (Signed)
 PROGRESS NOTE                                                                                                                                                                                                             Patient Demographics:    Sherry Baker, is a 58 y.o. female, DOB - 03-Aug-1966, IHK:[id number]  Outpatient Primary MD for the patient is Storm Frisk, MD    LOS - 4  Admit date - 12/31/2023    Chief Complaint  Patient presents with   Abdominal Pain    Hypotensive       Brief Narrative (HPI from H&P)   58 year old woman with PMH of rectal cancer s/p surgery with colostomy, HTN, GERD, depression, COPD, recurrent UTIs who presented with nausea, vomiting.  Patient reported that she had just finished a course of antibiotics.  Presented with AKI, UA concerning for UTI.  Noted that recent urine cultures grew ESBL Klebsiella.  Patient was started on meropenem.    Subjective:   Patient in bed, appears comfortable, denies any headache, no fever, no chest pain or pressure, no shortness of breath , no abdominal pain. No focal weakness.   Assessment  & Plan :   AKI on CKD Baseline creatinine is 1.4-1.5. Clinically prerenal due to nausea vomiting and dehydration improving with IV fluids continue continue gentle IV fluids 01/03/2024, no acute findings on renal ultrasound, renal function has plateaued around 1.8, recheck in 5 to 7 days.   ESBL Klebsiella UTI Urine cultures growing ESBL Klebsiella, continue meropenem for a total of 5-7 days, clinically better Failed outpatient nitrofurantoin treatment.   Abdominal pain. Normal CT nonacute question due to UTI, UTI being treated pain resolved.   History of rectal cancer s/p colostomy -Continue colostomy care.   Nighttime sinus bradycardia with a 2-second pause on 01/02/2024.  Was on very high doses of Coreg, stopped, stable TSH, bradycardia has resolved.  HTN  -  continue Norvasc, added hydralazine, stop Coreg due to above   COPD -Continue home Anoro Ellipta.   Depression.  No acute issues supportive care.  Chronic sacral decubitus ulcer present on admission kindly see nursing notes.  Supportive care no acute issues.  Wound care team following.      Condition - Extremely Guarded  Family Communication  : Patient's cousin crystal [phone] called 01/02/2024  and message left 10:20 AM.  Called 01/03/2024 at 10:08 AM message left.  Called and updated cousin on the 06/22/2024.  Code Status :   Full  Consults  :  None  PUD Prophylaxis : PPI   Procedures  :     Renal US - 1. Slightly atrophic right kidney with cortical scarring but no hydronephrosis. Mildly echogenic right renal cortex consistent with medical renal disease. 2. Normal left kidney  CT - 1. No acute localizing process in the abdomen or pelvis. 2. Stable left-sided colostomy. No bowel obstruction. 3. Stable sacrococcygeal decubitus ulcer with erosive changes of the tip of the coccyx. 4. Stable diffuse bladder wall thickening. Correlate clinically for cystitis. 5. Stable chronic appearing reticular opacities in both lung bases. 6. 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 7. Small hiatal hernia. 8. Aortic atherosclerosis. Aortic Atherosclerosis (ICD10-I70.0).      Disposition Plan  :    Status is: Inpatient   DVT Prophylaxis  :    enoxaparin (LOVENOX) injection 30 mg Start: 01/01/24 1400    Lab Results  Component Value Date   PLT 199 01/05/2024    Diet :  Diet Order             Diet Heart Room service appropriate? Yes; Fluid consistency: Thin  Diet effective now                    Inpatient Medications  Scheduled Meds:  amLODipine  10 mg Oral Daily   enoxaparin (LOVENOX) injection  30 mg Subcutaneous Q24H   feeding supplement  237 mL Oral TID BM   hydrALAZINE  100 mg Oral Q8H   pantoprazole  40 mg Oral Daily   QUEtiapine  50 mg Oral BID    sertraline  50 mg Oral Daily   umeclidinium-vilanterol  1 puff Inhalation Daily   Continuous Infusions:  meropenem (MERREM) IV 500 mg (01/05/24 0846)   PRN Meds:.acetaminophen, albuterol, diphenhydrAMINE, HYDROcodone-acetaminophen, ondansetron (ZOFRAN) IV    Objective:   Vitals:   01/04/24 2024 01/05/24 0535 01/05/24 0755 01/05/24 0800  BP: (!) 118/53 120/75 132/80 132/80  Pulse: 89 72 69 71  Resp: 20 18 19 16   Temp: 98 F (36.7 C) 98 F (36.7 C) 98.1 F (36.7 C)   TempSrc:   Oral   SpO2: 95% 94% 95% 95%  Weight:      Height:        Wt Readings from Last 3 Encounters:  01/01/24 51.1 kg  12/16/23 49.2 kg  11/16/23 47.6 kg     Intake/Output Summary (Last 24 hours) at 01/05/2024 0944 Last data filed at 01/05/2024 0758 Gross per 24 hour  Intake --  Output 950 ml  Net -950 ml     Physical Exam  Awake Alert, No new F.N deficits, Normal affect [place].AT,PERRAL Supple Neck, No JVD,   Symmetrical Chest wall movement, Good air movement bilaterally, CTAB RRR,No Gallops,Rubs or new Murmurs,  +ve B.Sounds, Abd Soft, No tenderness, colostomy bag with brown stool No Cyanosis, Clubbing or edema     RN pressure injury documentation: Pressure Injury 02/03/22 Sacrum Stage 4 - Full thickness tissue loss with exposed bone, tendon or muscle. (Active)  02/03/22   Location: Sacrum  Location Orientation:   Staging: Stage 4 - Full thickness tissue loss with exposed bone, tendon or muscle.  Wound Description (Comments):   Present on Admission: Yes     Pressure Injury 03/06/22 Sacrum Stage 4 - Full thickness tissue loss with  exposed bone, tendon or muscle. (Active)  03/06/22 1900  Location: Sacrum  Location Orientation:   Staging: Stage 4 - Full thickness tissue loss with exposed bone, tendon or muscle.  Wound Description (Comments):   Present on Admission: Yes     Pressure Injury 04/09/22 Buttocks Right Stage 2 -  Partial thickness loss of dermis presenting as a shallow open injury  with a red, pink wound bed without slough. pink, red (Active)  04/09/22 0415  Location: Buttocks  Location Orientation: Right  Staging: Stage 2 -  Partial thickness loss of dermis presenting as a shallow open injury with a red, pink wound bed without slough.  Wound Description (Comments): pink, red  Present on Admission: Yes     Pressure Injury 04/09/22 Sacrum Upper Stage 3 -  Full thickness tissue loss. Subcutaneous fat may be visible but bone, tendon or muscle are NOT exposed. (Active)  04/09/22 0415  Location: Sacrum  Location Orientation: Upper  Staging: Stage 3 -  Full thickness tissue loss. Subcutaneous fat may be visible but bone, tendon or muscle are NOT exposed.  Wound Description (Comments):   Present on Admission: No     Pressure Injury 04/13/22 Buttocks Right Deep Tissue Pressure Injury - Purple or maroon localized area of discolored intact skin or blood-filled blister due to damage of underlying soft tissue from pressure and/or shear. (Active)  04/13/22 2042  Location: Buttocks  Location Orientation: Right  Staging: Deep Tissue Pressure Injury - Purple or maroon localized area of discolored intact skin or blood-filled blister due to damage of underlying soft tissue from pressure and/or shear.  Wound Description (Comments):   Present on Admission: No     Pressure Injury 04/13/22 Toe (Comment  which one) Anterior;Right Deep Tissue Pressure Injury - Purple or maroon localized area of discolored intact skin or blood-filled blister due to damage of underlying soft tissue from pressure and/or shear. (Active)  04/13/22 2042  Location: Toe (Comment  which one)  Location Orientation: Anterior;Right  Staging: Deep Tissue Pressure Injury - Purple or maroon localized area of discolored intact skin or blood-filled blister due to damage of underlying soft tissue from pressure and/or shear.  Wound Description (Comments):   Present on Admission:      Pressure Injury 06/11/22 Buttocks  Left Stage 2 -  Partial thickness loss of dermis presenting as a shallow open injury with a red, pink wound bed without slough. (Active)  06/11/22 1257  Location: Buttocks  Location Orientation: Left  Staging: Stage 2 -  Partial thickness loss of dermis presenting as a shallow open injury with a red, pink wound bed without slough.  Wound Description (Comments):   Present on Admission: No     Pressure Injury 06/11/22 Buttocks Right Stage 2 -  Partial thickness loss of dermis presenting as a shallow open injury with a red, pink wound bed without slough. (Active)  06/11/22 1258  Location: Buttocks  Location Orientation: Right  Staging: Stage 2 -  Partial thickness loss of dermis presenting as a shallow open injury with a red, pink wound bed without slough.  Wound Description (Comments):   Present on Admission:      Pressure Injury 10/26/22 Coccyx Mid Stage 4 - Full thickness tissue loss with exposed bone, tendon or muscle. (Active)  10/26/22 1430  Location: Coccyx  Location Orientation: Mid  Staging: Stage 4 - Full thickness tissue loss with exposed bone, tendon or muscle.  Wound Description (Comments):   Present on Admission: Yes  Pressure Injury 10/26/22 Knee Anterior;Left Deep Tissue Pressure Injury - Purple or maroon localized area of discolored intact skin or blood-filled blister due to damage of underlying soft tissue from pressure and/or shear. (Active)  10/26/22 1330  Location: Knee  Location Orientation: Anterior;Left  Staging: Deep Tissue Pressure Injury - Purple or maroon localized area of discolored intact skin or blood-filled blister due to damage of underlying soft tissue from pressure and/or shear.  Wound Description (Comments):   Present on Admission: Yes     Pressure Injury 10/28/22 Elbow Posterior;Right Stage 1 -  Intact skin with non-blanchable redness of a localized area usually over a bony prominence. (Active)  10/28/22 1235  Location: Elbow  Location  Orientation: Posterior;Right  Staging: Stage 1 -  Intact skin with non-blanchable redness of a localized area usually over a bony prominence.  Wound Description (Comments):   Present on Admission: Yes     Pressure Injury 10/28/22 Elbow Left;Posterior Stage 1 -  Intact skin with non-blanchable redness of a localized area usually over a bony prominence. (Active)  10/28/22 1235  Location: Elbow  Location Orientation: Left;Posterior  Staging: Stage 1 -  Intact skin with non-blanchable redness of a localized area usually over a bony prominence.  Wound Description (Comments):   Present on Admission:      Pressure Injury 01/01/24 Coccyx Mid Deep Open Red Wound (Active)  01/01/24 1800  Location: Coccyx  Location Orientation: Mid  Staging:   Wound Description (Comments): Deep Open Red Wound  Present on Admission: Yes  Dressing Type Moist to dry 01/05/24 0755      Data Review:    Recent Labs  Lab 01/01/24 0230 01/02/24 0613 01/03/24 0606 01/04/24 0506 01/05/24 0459  WBC 11.2* 9.5 8.7 10.1 10.0  HGB 10.9* 11.0* 10.6* 10.8* 10.9*  HCT 33.9* 34.4* 33.3* 33.1* 32.9*  PLT 191 201 186 206 199  MCV 86.3 86.6 86.5 83.8 83.3  MCH 27.7 27.7 27.5 27.3 27.6  MCHC 32.2 32.0 31.8 32.6 33.1  RDW 18.0* 18.3* 18.5* 18.1* 18.0*  LYMPHSABS 0.7  --  0.8 2.1 2.2  MONOABS 0.3  --  0.2 0.7 1.0  EOSABS 1.8*  --  2.8* 2.4* 2.4*  BASOSABS 0.0  --  0.0 0.0 0.0    Recent Labs  Lab 12/31/23 2017 01/01/24 0230 01/02/24 0613 01/03/24 0606 01/04/24 0506 01/05/24 0459  NA 133* 134* 136 135 134* 133*  K 4.5 4.1 4.4 4.7 5.1 4.8  CL 104 110 108 106 101 100  CO2 15* 15* 18* 20* 24 25  ANIONGAP 14 9 10 9 9 8   GLUCOSE 87 108* 112* 110* 97 93  BUN 30* 32* 32* 33* 37* 41*  CREATININE 2.22* 2.17* 1.91* 1.75* 1.81* 1.82*  AST 15 10*  --   --   --   --   ALT 10 8  --   --   --   --   ALKPHOS 67 56  --   --   --   --   BILITOT 0.3 0.4  --   --   --   --   ALBUMIN 2.8* 2.1*  --   --   --   --   CRP  --    --  8.2* 3.1* 1.8* 1.4*  PROCALCITON  --   --  1.64 0.90 0.54 0.34  TSH  --   --   --  2.910  --   --   BNP  --   --   --  308.7* 159.3* 148.5*  MG  --   --  1.7 2.0 1.8 1.9  PHOS  --   --  3.8  --   --   --   CALCIUM 8.7* 7.9* 9.0 9.0 8.7* 8.4*      Recent Labs  Lab 01/01/24 0230 01/02/24 0613 01/03/24 0606 01/04/24 0506 01/05/24 0459  CRP  --  8.2* 3.1* 1.8* 1.4*  PROCALCITON  --  1.64 0.90 0.54 0.34  TSH  --   --  2.910  --   --   BNP  --   --  308.7* 159.3* 148.5*  MG  --  1.7 2.0 1.8 1.9  CALCIUM 7.9* 9.0 9.0 8.7* 8.4*    --------------------------------------------------------------------------------------------------------------- Lab Results  Component Value Date   TRIG 176 (H) 11/03/2022    Lab Results  Component Value Date   HGBA1C 5.0 02/06/2022   Recent Labs    01/03/24 0606  TSH 2.910  FREET4 0.74   No results for input(s): "VITAMINB12", "FOLATE", "FERRITIN", "TIBC", "IRON", "RETICCTPCT" in the last 72 hours. ------------------------------------------------------------------------------------------------------------------ Cardiac Enzymes No results for input(s): "CKMB", "TROPONINI", "MYOGLOBIN" in the last 168 hours.  Invalid input(s): "CK"  Micro Results Recent Results (from the past 240 hours)  Urine Culture     Status: Abnormal   Collection Time: 01/01/24 12:36 AM   Specimen: Urine, Clean Catch  Result Value Ref Range Status   Specimen Description URINE, CLEAN CATCH  Final   Special Requests   Final    NONE Performed at [facility], [address] [place], [place] [address]    Culture (A)  Final    >=100,000 COLONIES/mL KLEBSIELLA PNEUMONIAE Confirmed Extended Spectrum Beta-Lactamase Producer (ESBL).  In bloodstream infections from ESBL organisms, carbapenems are preferred over piperacillin/tazobactam. They are shown to have a lower risk of mortality.    Report Status 01/04/2024 FINAL  Final   Organism ID, Bacteria KLEBSIELLA  PNEUMONIAE (A)  Final      Susceptibility   Klebsiella pneumoniae - MIC*    AMPICILLIN >=32 RESISTANT Resistant     CEFAZOLIN >=64 RESISTANT Resistant     CEFEPIME >=32 RESISTANT Resistant     CEFTRIAXONE >=64 RESISTANT Resistant     CIPROFLOXACIN >=4 RESISTANT Resistant     GENTAMICIN >=16 RESISTANT Resistant     IMIPENEM <=0.25 SENSITIVE Sensitive     NITROFURANTOIN 128 RESISTANT Resistant     TRIMETH/SULFA >=320 RESISTANT Resistant     AMPICILLIN/SULBACTAM >=32 RESISTANT Resistant     PIP/TAZO 64 INTERMEDIATE Intermediate ug/mL    * >=100,000 COLONIES/mL KLEBSIELLA PNEUMONIAE    Radiology Report No results found.    Signature  -   Susa Raring M.D on 01/05/2024 at 9:44 AM   -  To page go to [email or web address]

## 2024-01-05 NOTE — Progress Notes (Addendum)
   01/05/24 1230  Mobility  Activity Ambulated with assistance in hallway  Level of Assistance Contact guard assist, steadying assist  Assistive Device Front wheel walker  Distance Ambulated (ft) 150 ft  Activity Response Tolerated fair  Mobility Referral Yes  Mobility visit 1 Mobility  Mobility Specialist Start Time (ACUTE ONLY) 1215  Mobility Specialist Stop Time (ACUTE ONLY) 1230  Mobility Specialist Time Calculation (min) (ACUTE ONLY) 15 min   Mobility Specialist: Progress Note  Pre-Mobility:      HR 90s, SpO2 97%, RA Post-Mobility:    HR 90, SpO2 100%  RA  Pt agreeable to mobility session - received in chair. C/o nausea and stomach pain rated 8/10. Returned to chair with all needs met - call bell within reach. Pt with nasul cannula off, VSS. Knee buckling x1 when STS.   Barnie Mort, BS Mobility Specialist Please contact via SecureChat or  Rehab office at [phone].

## 2024-01-06 ENCOUNTER — Encounter (INDEPENDENT_AMBULATORY_CARE_PROVIDER_SITE_OTHER): Payer: Self-pay

## 2024-01-06 ENCOUNTER — Encounter (HOSPITAL_COMMUNITY): Payer: Self-pay | Admitting: Internal Medicine

## 2024-01-06 DIAGNOSIS — N179 Acute kidney failure, unspecified: Secondary | ICD-10-CM | POA: Diagnosis not present

## 2024-01-06 DIAGNOSIS — N3000 Acute cystitis without hematuria: Secondary | ICD-10-CM

## 2024-01-06 LAB — PROCALCITONIN: Procalcitonin: 0.18 ng/mL

## 2024-01-06 LAB — CBC WITH DIFFERENTIAL/PLATELET
Abs Immature Granulocytes: 0.83 10*3/uL — ABNORMAL HIGH (ref 0.00–0.07)
Basophils Absolute: 0.1 10*3/uL (ref 0.0–0.1)
Basophils Relative: 1 %
Eosinophils Absolute: 3 10*3/uL — ABNORMAL HIGH (ref 0.0–0.5)
Eosinophils Relative: 32 %
HCT: 31.5 % — ABNORMAL LOW (ref 36.0–46.0)
Hemoglobin: 10.6 g/dL — ABNORMAL LOW (ref 12.0–15.0)
Immature Granulocytes: 9 %
Lymphocytes Relative: 14 %
Lymphs Abs: 1.4 10*3/uL (ref 0.7–4.0)
MCH: 28.1 pg (ref 26.0–34.0)
MCHC: 33.7 g/dL (ref 30.0–36.0)
MCV: 83.6 fL (ref 80.0–100.0)
Monocytes Absolute: 0.6 10*3/uL (ref 0.1–1.0)
Monocytes Relative: 7 %
Neutro Abs: 3.7 10*3/uL (ref 1.7–7.7)
Neutrophils Relative %: 37 %
Platelets: 192 10*3/uL (ref 150–400)
RBC: 3.77 MIL/uL — ABNORMAL LOW (ref 3.87–5.11)
RDW: 18 % — ABNORMAL HIGH (ref 11.5–15.5)
WBC: 9.6 10*3/uL (ref 4.0–10.5)
nRBC: 0 % (ref 0.0–0.2)

## 2024-01-06 LAB — MAGNESIUM: Magnesium: 2 mg/dL (ref 1.7–2.4)

## 2024-01-06 LAB — BASIC METABOLIC PANEL
Anion gap: 13 (ref 5–15)
BUN: 49 mg/dL — ABNORMAL HIGH (ref 6–20)
CO2: 20 mmol/L — ABNORMAL LOW (ref 22–32)
Calcium: 8.5 mg/dL — ABNORMAL LOW (ref 8.9–10.3)
Chloride: 101 mmol/L (ref 98–111)
Creatinine, Ser: 1.83 mg/dL — ABNORMAL HIGH (ref 0.44–1.00)
GFR, Estimated: 32 mL/min — ABNORMAL LOW (ref >60)
Glucose, Bld: 114 mg/dL — ABNORMAL HIGH (ref 70–99)
Potassium: 4.6 mmol/L (ref 3.5–5.1)
Sodium: 134 mmol/L — ABNORMAL LOW (ref 135–145)

## 2024-01-06 LAB — C-REACTIVE PROTEIN: CRP: 1.2 mg/dL — ABNORMAL HIGH (ref <1.0)

## 2024-01-06 LAB — BRAIN NATRIURETIC PEPTIDE: B Natriuretic Peptide: 114 pg/mL — ABNORMAL HIGH (ref 0.0–100.0)

## 2024-01-06 MED ORDER — OXYCODONE HCL 5 MG PO TABS: 5.0000 mg | ORAL_TABLET | Freq: Once | ORAL | Status: DC

## 2024-01-06 MED ORDER — TRAMADOL HCL 50 MG PO TABS
50.0000 mg | ORAL_TABLET | Freq: Once | ORAL | Status: AC
  Administered 2024-01-06: 50 mg via ORAL
  Filled 2024-01-06: qty 1

## 2024-01-06 MED ORDER — PNEUMOCOCCAL 20-VAL CONJ VACC 0.5 ML IM SUSY
0.5000 mL | PREFILLED_SYRINGE | INTRAMUSCULAR | Status: AC
  Administered 2024-01-07: 0.5 mL via INTRAMUSCULAR
  Filled 2024-01-06: qty 0.5

## 2024-01-06 NOTE — Consult Note (Signed)
 I have been asked to see the patient by Dr. Darra Lis for evaluation and management of bladder pain associated with chronic cystitis.  History of present illness: 58 year old woman with a history of cirrhosis, polysubstance abuse, and rectal cancer s/p LAR and ileostomy as well as radiation.  She also noted to have a urinary tract infection which was complicated by chronic urinary retention and was noted during hospitalization with a CT cystogram to have a vesicocutaneous fistula.  Urology was called today for persistent suprapubic pain.  She is currently being treated for a UTI with an AKI.   Review of systems: A 12 point comprehensive review of systems was obtained and is negative unless otherwise stated in the history of present illness.  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   History of colon cancer    Normocytic anemia    Metabolic acidemia    Acquired hypothyroidism    HTN (hypertension)    Right hip pain causing ambulatory dysfunction 11/26/2021   CKD (chronic kidney disease), stage III (HCC) 11/25/2021   Physical deconditioning    Ambulatory dysfunction    Protein-calorie malnutrition, severe 06/25/2021   Homelessness 06/23/2021   Alcoholic cirrhosis of liver without ascites (HCC) 06/23/2021   Alcohol abuse 06/14/2019   Pressure injury of skin 06/06/2019   Genetic testing 03/24/2017   GERD (gastroesophageal reflux disease) 02/18/2017   Rectal cancer (HCC) 01/27/2017   Anemia 01/16/2017   Tobacco abuse    Bipolar affective  disorder (HCC)    Substance abuse (HCC) 02/11/2014   Depression 02/11/2014   Thrombocytopenia (HCC) 02/11/2014    No current facility-administered medications on file prior to encounter.   Current Outpatient Medications on File Prior to Encounter  Medication Sig Dispense Refill   acetaminophen-codeine (TYLENOL #3) 300-30 MG tablet Take 1-2 tablets by mouth every 8 (eight) hours as needed for moderate pain (pain score 4-6). 60 tablet 0   albuterol (VENTOLIN HFA) 108 (90 Base) MCG/ACT inhaler Inhale 2 puffs into the lungs every 6 (six) hours as needed for wheezing or shortness of breath. 18 g 1   amLODipine (NORVASC) 10 MG tablet Take 1 tablet (10 mg total) by mouth daily. 90 tablet 1   carvedilol (COREG) 25 MG tablet Take 1 tablet (25 mg total) by mouth 2 (two) times daily with a meal. 120 tablet 2   feeding supplement (ENSURE ENLIVE / ENSURE PLUS) LIQD Take 237 mLs by mouth 3 (three) times daily between meals. 237 mL 6   ondansetron (ZOFRAN) 4 MG tablet Take 1 tablet (4 mg total) by mouth every 8 (eight) hours as needed for nausea or vomiting. 20 tablet 0   pantoprazole (PROTONIX) 40 MG tablet Take 1 tablet (40 mg total) by mouth daily. 90 tablet 1   potassium chloride (KLOR-CON) 10 MEQ tablet Take 1 tablet (10 mEq total) by mouth daily. 30 tablet 0   QUEtiapine (SEROQUEL) 50 MG tablet Take 1 tablet (50 mg total) by mouth 2 (two) times daily. 180 tablet 1   sertraline (ZOLOFT) 50 MG tablet Take 1 tablet (  50 mg total) by mouth daily. 90 tablet 1   umeclidinium-vilanterol (ANORO ELLIPTA) 62.5-25 MCG/ACT AEPB Inhale 1 puff into the lungs daily. (Patient taking differently: Inhale 1 puff into the lungs daily as needed (for shortness of breath).) 60 each 2   fluconazole (DIFLUCAN) 150 MG tablet Take 1 tablet (150 mg total) by mouth every 3 (three) days. For yeast infection (Patient not taking: Reported on 01/01/2024) 2 tablet 0   sodium bicarbonate 650 MG tablet Take 1 tablet (650 mg total) by mouth  daily. (Patient not taking: Reported on 01/01/2024) 60 tablet 2   valsartan (DIOVAN) 160 MG tablet Take 1 tablet (160 mg total) by mouth daily. (Patient not taking: Reported on 01/01/2024) 90 tablet 3   [DISCONTINUED] ziprasidone (GEODON) 40 MG capsule Take 1 capsule (40 mg total) by mouth 2 (two) times daily with a meal. 20 capsule 0    Past Medical History:  Diagnosis Date   Acquired hypothyroidism    Alcohol abuse    Allergy    PCNS swelling   Arthritis    Bipolar 1 disorder (HCC)    Chronic kidney disease    Chronic pain syndrome    Cirrhosis of liver (HCC)    Cocaine abuse (HCC)    COVID-19 virus infection 06/23/2021   Delirium tremens (HCC) 06/22/2021   Depression    Hypertension    Rectal cancer (HCC) 01/21/2017   Suicidal ideation 02/11/2014    Past Surgical History:  Procedure Laterality Date   ABDOMINAL PERINEAL BOWEL RESECTION N/A 06/18/2017   Procedure: ABDOMINAL PERINEAL RESECTION ERAS PATHWAY;  Surgeon: Romie Levee, MD;  Location: [facility];  Service: General;  Laterality: N/A;   BUBBLE STUDY  11/29/2021   Procedure: BUBBLE STUDY;  Surgeon: Thomasene Ripple, DO;  Location: [facility];  Service: Cardiovascular;;   COLON SURGERY     COLONOSCOPY WITH PROPOFOL Left 01/21/2017   Procedure: COLONOSCOPY WITH PROPOFOL;  Surgeon: Kathi Der, MD;  Location: [facility];  Service: Gastroenterology;  Laterality: Left;   Colostomy     FRACTURE SURGERY     MANDIBLE FRACTURE SURGERY     TEE WITHOUT CARDIOVERSION N/A 11/29/2021   Procedure: TRANSESOPHAGEAL ECHOCARDIOGRAM (TEE);  Surgeon: Thomasene Ripple, DO;  Location: [facility];  Service: Cardiovascular;  Laterality: N/A;    Social History   Tobacco Use   Smoking status: Every Day    Current packs/day: 1.00    Average packs/day: 1 pack/day for 47.2 years (47.2 ttl pk-yrs)    Types: Cigarettes    Start date: 11/04/1976   Smokeless tobacco: Never   Tobacco comments:    6-7 cigarettes a day  Vaping Use   Vaping status:  Never Used  Substance Use Topics   Alcohol use: Not Currently    Comment: uknown   Drug use: Not Currently    Types: Cocaine, "Crack" cocaine    Comment: 07/28/2017 "nothing in 3 wks"    Family History  Problem Relation Age of Onset   Cancer Mother 62       Originating in the abdomen, otherwise unknwon   Cancer - Other Maternal Aunt        Throat    PE: Vitals:   01/05/24 1722 01/05/24 2305 01/06/24 0355 01/06/24 0759  BP: 113/71 112/61 118/61 127/73  Pulse: 88 80 78 78  Resp: (!) 22 18 15  (!) 23  Temp: 98.3 F (36.8 C)  98 F (36.7 C) (!) 97.5 F (36.4 C)  TempSrc: Oral  Axillary Oral  SpO2: 96%  94% 92% 96%  Weight:      Height:       Patient appears to be in no acute distress  patient is alert and oriented x3 Atraumatic normocephalic head No cervical or supraclavicular lymphadenopathy appreciated No increased work of breathing, no audible wheezes/rhonchi Regular sinus rhythm/rate Abdomen is soft, nontender, nondistended, productive left lower quadrant ileostomy, prior scar in suprapubic area consistent with prior SP tube.  No drainage or erythema noted from the area.  No tenderness to palpation. Lower extremities are symmetric without appreciable edema Grossly neurologically intact No identifiable skin lesions  Recent Labs    01/04/24 0506 01/05/24 0459 01/06/24 0523  WBC 10.1 10.0 9.6  HGB 10.8* 10.9* 10.6*  HCT 33.1* 32.9* 31.5*   Recent Labs    01/04/24 0506 01/05/24 0459 01/06/24 0523  NA 134* 133* 134*  K 5.1 4.8 4.6  CL 101 100 101  CO2 24 25 20*  GLUCOSE 97 93 114*  BUN 37* 41* 49*  CREATININE 1.81* 1.82* 1.83*  CALCIUM 8.7* 8.4* 8.5*   No results for input(s): "LABPT", "INR" in the last 72 hours. No results for input(s): "LABURIN" in the last 72 hours. Results for orders placed or performed during the hospital encounter of 12/31/23  Urine Culture     Status: Abnormal   Collection Time: 01/01/24 12:36 AM   Specimen: Urine, Clean Catch   Result Value Ref Range Status   Specimen Description URINE, CLEAN CATCH  Final   Special Requests   Final    NONE Performed at [facility], [address], [place], [place] [address]    Culture (A)  Final    >=100,000 COLONIES/mL KLEBSIELLA PNEUMONIAE Confirmed Extended Spectrum Beta-Lactamase Producer (ESBL).  In bloodstream infections from ESBL organisms, carbapenems are preferred over piperacillin/tazobactam. They are shown to have a lower risk of mortality.    Report Status 01/04/2024 FINAL  Final   Organism ID, Bacteria KLEBSIELLA PNEUMONIAE (A)  Final      Susceptibility   Klebsiella pneumoniae - MIC*    AMPICILLIN >=32 RESISTANT Resistant     CEFAZOLIN >=64 RESISTANT Resistant     CEFEPIME >=32 RESISTANT Resistant     CEFTRIAXONE >=64 RESISTANT Resistant     CIPROFLOXACIN >=4 RESISTANT Resistant     GENTAMICIN >=16 RESISTANT Resistant     IMIPENEM <=0.25 SENSITIVE Sensitive     NITROFURANTOIN 128 RESISTANT Resistant     TRIMETH/SULFA >=320 RESISTANT Resistant     AMPICILLIN/SULBACTAM >=32 RESISTANT Resistant     PIP/TAZO 64 INTERMEDIATE Intermediate ug/mL    * >=100,000 COLONIES/mL KLEBSIELLA PNEUMONIAE    Imaging: CT Abd/Pelvis January 2024 IMPRESSION: 1. No findings to explain the patient's right lower quadrant pain. 2. Decubitus ulcer and associated abscess above the gluteal fold. 3. Peribronchovascular nodularity in the lingula and left lower lobe may be due to an infectious bronchiolitis. Nonemergent CT chest with contrast could be performed in further assessment of possible metastatic disease, as clinically indicated. 4. 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     Electronically Signed   By: Leanna Battles M.D.   On: 11/10/2022 09:25   CT Abd/Pelvis 12/31/23 IMPRESSION: 1. No acute localizing process in the abdomen or pelvis. 2.  Stable left-sided colostomy. No bowel obstruction. 3. Stable sacrococcygeal decubitus ulcer with erosive changes of the tip of the coccyx. 4. Stable diffuse bladder wall thickening. Correlate clinically for cystitis. 5. Stable chronic appearing reticular  opacities in both lung bases. 6. 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 7. Small hiatal hernia. 8. Aortic atherosclerosis.   Aortic Atherosclerosis (ICD10-I70.0).     Electronically Signed   By: Darliss Cheney M.D.   On: 12/31/2023 23:53    Assessment/Plan: 1.  Chronic cystitis 2.  Vesicocutaneous fistula  -Patient is not acutely tender on physical exam and is being treated according to urine culture -No active drainage from vesicocutaneous fistula that was identified in January 2024 -Given patient's extensive history of surgery and radiation would recommend further evaluation at tertiary care center for persistent fistula should she continue to have drainage -No further urologic intervention  Rylin Seavey D Laurinda Carreno
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
## 2024-01-06 NOTE — Plan of Care (Signed)
   Problem: Education: Goal: Knowledge of General Education information will improve Description: Including pain rating scale, medication(s)/side effects and non-pharmacologic comfort measures Outcome: Progressing   Problem: Health Behavior/Discharge Planning: Goal: Ability to manage health-related needs will improve Outcome: Progressing   Problem: Activity: Goal: Risk for activity intolerance will decrease Outcome: Progressing   Problem: Nutrition: Goal: Adequate nutrition will be maintained Outcome: Progressing   Problem: Coping: Goal: Level of anxiety will decrease Outcome: Progressing   Problem: Elimination: Goal: Will not experience complications related to bowel motility Outcome: Progressing Goal: Will not experience complications related to urinary retention Outcome: Progressing   Problem: Pain Managment: Goal: General experience of comfort will improve and/or be controlled Outcome: Progressing   Problem: Safety: Goal: Ability to remain free from injury will improve Outcome: Progressing   Problem: Skin Integrity: Goal: Risk for impaired skin integrity will decrease Outcome: Progressing

## 2024-01-06 NOTE — Progress Notes (Addendum)
   01/06/24 1558  Mobility  Activity Ambulated with assistance in hallway  Level of Assistance Minimal assist, patient does 75% or more  Assistive Device Front wheel walker  Distance Ambulated (ft) 150 ft  Activity Response Tolerated fair  Mobility Referral Yes  Mobility visit 1 Mobility  Mobility Specialist Start Time (ACUTE ONLY) 1535  Mobility Specialist Stop Time (ACUTE ONLY) 1558  Mobility Specialist Time Calculation (min) (ACUTE ONLY) 23 min   Mobility Specialist: Progress Note  Pt agreeable to mobility session - received in bed. C/o abd pain rated 8/10 and headache. Returned to bed with all needs met - call bell within reach. Bed alarm on.   Barnie Mort, BS Mobility Specialist Please contact via SecureChat or  Rehab office at [phone].

## 2024-01-06 NOTE — Progress Notes (Signed)
 PROGRESS NOTE                                                                                                                                                                                                             Patient Demographics:    Sherry Baker, is a 58 y.o. female, DOB - [birth year], ZOX:[id number]  Outpatient Primary MD for the patient is Storm Frisk, MD    LOS - 5  Admit date - 12/31/2023    Chief Complaint  Patient presents with   Abdominal Pain    Hypotensive       Brief Narrative (HPI from H&P)   58 year old woman with PMH of rectal cancer s/p surgery with colostomy, HTN, GERD, depression, COPD, recurrent UTIs who presented with nausea, vomiting.  Patient reported that she had just finished a course of antibiotics.  Presented with AKI, UA concerning for UTI.  Noted that recent urine cultures grew ESBL Klebsiella.  Patient was started on meropenem.    Subjective:   Patient in bed, patient complaining of lower abdominal pain  Assessment  & Plan :   AKI on CKD Baseline creatinine is 1.4-1.5. Clinically prerenal due to nausea vomiting and dehydration improving with IV fluids continue continue gentle IV fluids 01/03/2024, no acute findings on renal ultrasound, renal function has plateaued around 1.8, recheck in 5 to 7 days.   ESBL Klebsiella UTI Urine cultures growing ESBL Klebsiella, continue meropenem for a total of 7 days treatment, stop date 01/07/2024, clinically better Failed outpatient nitrofurantoin treatment.   Abdominal pain. Vesiculo cutaneous fistula -Imaging on admission admission significant for cystitis, she had suprapubic tenderness, she is with known history of vesicular cutaneous fistula, cussed with urology, Dr. Arita Miss to evaluate today -Bladder scan has been obtained with low volume, In-N-Out done this morning with 100 cc of clear urine output noted.   History of rectal cancer s/p  colostomy -Continue colostomy care.   Nighttime sinus bradycardia with a 2-second pause on 01/02/2024.  Was on very high doses of Coreg, stopped, stable TSH, bradycardia has resolved.  HTN  - continue Norvasc, added hydralazine, stop Coreg due to above   COPD -Continue home Anoro Ellipta.   Depression.  No acute issues supportive care.  Chronic sacral decubitus ulcer present on admission kindly see nursing notes.  Supportive care no acute issues.  Wound care team following.      Condition - Extremely Guarded  Family Communication  : Discussed with cousin at bedside  Code Status :   Full  Consults  :  None  PUD Prophylaxis : PPI   Procedures  :     Renal US - 1. Slightly atrophic right kidney with cortical scarring but no hydronephrosis. Mildly echogenic right renal cortex consistent with medical renal disease. 2. Normal left kidney  CT - 1. No acute localizing process in the abdomen or pelvis. 2. Stable left-sided colostomy. No bowel obstruction. 3. Stable sacrococcygeal decubitus ulcer with erosive changes of the tip of the coccyx. 4. Stable diffuse bladder wall thickening. Correlate clinically for cystitis. 5. Stable chronic appearing reticular opacities in both lung bases. 6. 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 7. Small hiatal hernia. 8. Aortic atherosclerosis. Aortic Atherosclerosis (ICD10-I70.0).      Disposition Plan  :    Status is: Inpatient   DVT Prophylaxis  :    enoxaparin (LOVENOX) injection 30 mg Start: 01/01/24 1400    Lab Results  Component Value Date   PLT 192 01/06/2024    Diet :  Diet Order             Diet Heart Room service appropriate? Yes; Fluid consistency: Thin  Diet effective now                    Inpatient Medications  Scheduled Meds:  amLODipine  10 mg Oral Daily   enoxaparin (LOVENOX) injection  30 mg Subcutaneous Q24H   feeding supplement  237 mL Oral TID BM   hydrALAZINE  100 mg Oral Q8H    pantoprazole  40 mg Oral Daily   QUEtiapine  50 mg Oral BID   sertraline  50 mg Oral Daily   umeclidinium-vilanterol  1 puff Inhalation Daily   Continuous Infusions:  meropenem (MERREM) IV 500 mg (01/06/24 0915)   PRN Meds:.acetaminophen, albuterol, diphenhydrAMINE, HYDROcodone-acetaminophen, ondansetron (ZOFRAN) IV    Objective:   Vitals:   01/05/24 2305 01/06/24 0355 01/06/24 0759 01/06/24 1211  BP: 112/61 118/61 127/73 120/70  Pulse: 80 78 78 74  Resp: 18 15 (!) 23 17  Temp:  98 F (36.7 C) (!) 97.5 F (36.4 C) 98.7 F (37.1 C)  TempSrc:  Axillary Oral Oral  SpO2: 94% 92% 96% 90%  Weight:      Height:        Wt Readings from Last 3 Encounters:  01/01/24 51.1 kg  12/16/23 49.2 kg  11/16/23 47.6 kg     Intake/Output Summary (Last 24 hours) at 01/06/2024 1238 Last data filed at 01/05/2024 1404 Gross per 24 hour  Intake --  Output 400 ml  Net -400 ml     Physical Exam  Awake Alert, Oriented X 3, No new F.N deficits, Normal affect Symmetrical Chest wall movement, Good air movement bilaterally, CTAB RRR,No Gallops,Rubs or new Murmurs, No Parasternal Heave +ve B.Sounds, Abd Soft, colostomy present, has suprapubic tenderness to palpation No Cyanosis, Clubbing or edema, No new Rash or bruise       RN pressure injury documentation: Pressure Injury 02/03/22 Sacrum Stage 4 - Full thickness tissue loss with exposed bone, tendon or muscle. (Active)  02/03/22   Location: Sacrum  Location Orientation:   Staging: Stage 4 - Full thickness tissue loss with exposed bone, tendon or muscle.  Wound Description (Comments):   Present on Admission: Yes     Pressure Injury 03/06/22 Sacrum  Stage 4 - Full thickness tissue loss with exposed bone, tendon or muscle. (Active)  03/06/22 1900  Location: Sacrum  Location Orientation:   Staging: Stage 4 - Full thickness tissue loss with exposed bone, tendon or muscle.  Wound Description (Comments):   Present on Admission: Yes      Pressure Injury 04/09/22 Buttocks Right Stage 2 -  Partial thickness loss of dermis presenting as a shallow open injury with a red, pink wound bed without slough. pink, red (Active)  04/09/22 0415  Location: Buttocks  Location Orientation: Right  Staging: Stage 2 -  Partial thickness loss of dermis presenting as a shallow open injury with a red, pink wound bed without slough.  Wound Description (Comments): pink, red  Present on Admission: Yes     Pressure Injury 04/09/22 Sacrum Upper Stage 3 -  Full thickness tissue loss. Subcutaneous fat may be visible but bone, tendon or muscle are NOT exposed. (Active)  04/09/22 0415  Location: Sacrum  Location Orientation: Upper  Staging: Stage 3 -  Full thickness tissue loss. Subcutaneous fat may be visible but bone, tendon or muscle are NOT exposed.  Wound Description (Comments):   Present on Admission: No     Pressure Injury 04/13/22 Buttocks Right Deep Tissue Pressure Injury - Purple or maroon localized area of discolored intact skin or blood-filled blister due to damage of underlying soft tissue from pressure and/or shear. (Active)  04/13/22 2042  Location: Buttocks  Location Orientation: Right  Staging: Deep Tissue Pressure Injury - Purple or maroon localized area of discolored intact skin or blood-filled blister due to damage of underlying soft tissue from pressure and/or shear.  Wound Description (Comments):   Present on Admission: No     Pressure Injury 04/13/22 Toe (Comment  which one) Anterior;Right Deep Tissue Pressure Injury - Purple or maroon localized area of discolored intact skin or blood-filled blister due to damage of underlying soft tissue from pressure and/or shear. (Active)  04/13/22 2042  Location: Toe (Comment  which one)  Location Orientation: Anterior;Right  Staging: Deep Tissue Pressure Injury - Purple or maroon localized area of discolored intact skin or blood-filled blister due to damage of underlying soft tissue from  pressure and/or shear.  Wound Description (Comments):   Present on Admission:      Pressure Injury 06/11/22 Buttocks Left Stage 2 -  Partial thickness loss of dermis presenting as a shallow open injury with a red, pink wound bed without slough. (Active)  06/11/22 1257  Location: Buttocks  Location Orientation: Left  Staging: Stage 2 -  Partial thickness loss of dermis presenting as a shallow open injury with a red, pink wound bed without slough.  Wound Description (Comments):   Present on Admission: No     Pressure Injury 06/11/22 Buttocks Right Stage 2 -  Partial thickness loss of dermis presenting as a shallow open injury with a red, pink wound bed without slough. (Active)  06/11/22 1258  Location: Buttocks  Location Orientation: Right  Staging: Stage 2 -  Partial thickness loss of dermis presenting as a shallow open injury with a red, pink wound bed without slough.  Wound Description (Comments):   Present on Admission:      Pressure Injury 10/26/22 Coccyx Mid Stage 4 - Full thickness tissue loss with exposed bone, tendon or muscle. (Active)  10/26/22 1430  Location: Coccyx  Location Orientation: Mid  Staging: Stage 4 - Full thickness tissue loss with exposed bone, tendon or muscle.  Wound Description (Comments):  Present on Admission: Yes     Pressure Injury 10/26/22 Knee Anterior;Left Deep Tissue Pressure Injury - Purple or maroon localized area of discolored intact skin or blood-filled blister due to damage of underlying soft tissue from pressure and/or shear. (Active)  10/26/22 1330  Location: Knee  Location Orientation: Anterior;Left  Staging: Deep Tissue Pressure Injury - Purple or maroon localized area of discolored intact skin or blood-filled blister due to damage of underlying soft tissue from pressure and/or shear.  Wound Description (Comments):   Present on Admission: Yes     Pressure Injury 10/28/22 Elbow Posterior;Right Stage 1 -  Intact skin with non-blanchable  redness of a localized area usually over a bony prominence. (Active)  10/28/22 1235  Location: Elbow  Location Orientation: Posterior;Right  Staging: Stage 1 -  Intact skin with non-blanchable redness of a localized area usually over a bony prominence.  Wound Description (Comments):   Present on Admission: Yes     Pressure Injury 10/28/22 Elbow Left;Posterior Stage 1 -  Intact skin with non-blanchable redness of a localized area usually over a bony prominence. (Active)  10/28/22 1235  Location: Elbow  Location Orientation: Left;Posterior  Staging: Stage 1 -  Intact skin with non-blanchable redness of a localized area usually over a bony prominence.  Wound Description (Comments):   Present on Admission:      Pressure Injury 01/01/24 Coccyx Mid Deep Open Red Wound (Active)  01/01/24 1800  Location: Coccyx  Location Orientation: Mid  Staging:   Wound Description (Comments): Deep Open Red Wound  Present on Admission: Yes  Dressing Type Moist to dry 01/06/24 0726      Data Review:    Recent Labs  Lab 01/01/24 0230 01/02/24 0613 01/03/24 0606 01/04/24 0506 01/05/24 0459 01/06/24 0523  WBC 11.2* 9.5 8.7 10.1 10.0 9.6  HGB 10.9* 11.0* 10.6* 10.8* 10.9* 10.6*  HCT 33.9* 34.4* 33.3* 33.1* 32.9* 31.5*  PLT 191 201 186 206 199 192  MCV 86.3 86.6 86.5 83.8 83.3 83.6  MCH 27.7 27.7 27.5 27.3 27.6 28.1  MCHC 32.2 32.0 31.8 32.6 33.1 33.7  RDW 18.0* 18.3* 18.5* 18.1* 18.0* 18.0*  LYMPHSABS 0.7  --  0.8 2.1 2.2 1.4  MONOABS 0.3  --  0.2 0.7 1.0 0.6  EOSABS 1.8*  --  2.8* 2.4* 2.4* 3.0*  BASOSABS 0.0  --  0.0 0.0 0.0 0.1    Recent Labs  Lab 12/31/23 2017 01/01/24 0230 01/02/24 0613 01/03/24 0606 01/04/24 0506 01/05/24 0459 01/06/24 0523  NA 133* 134* 136 135 134* 133* 134*  K 4.5 4.1 4.4 4.7 5.1 4.8 4.6  CL 104 110 108 106 101 100 101  CO2 15* 15* 18* 20* 24 25 20*  ANIONGAP 14 9 10 9 9 8 13   GLUCOSE 87 108* 112* 110* 97 93 114*  BUN 30* 32* 32* 33* 37* 41* 49*   CREATININE 2.22* 2.17* 1.91* 1.75* 1.81* 1.82* 1.83*  AST 15 10*  --   --   --   --   --   ALT 10 8  --   --   --   --   --   ALKPHOS 67 56  --   --   --   --   --   BILITOT 0.3 0.4  --   --   --   --   --   ALBUMIN 2.8* 2.1*  --   --   --   --   --   CRP  --   --  8.2* 3.1* 1.8* 1.4* 1.2*  PROCALCITON  --   --  1.64 0.90 0.54 0.34 0.18  TSH  --   --   --  2.910  --   --   --   BNP  --   --   --  308.7* 159.3* 148.5* 114.0*  MG  --   --  1.7 2.0 1.8 1.9 2.0  PHOS  --   --  3.8  --   --   --   --   CALCIUM 8.7* 7.9* 9.0 9.0 8.7* 8.4* 8.5*      Recent Labs  Lab 01/02/24 0613 01/03/24 0606 01/04/24 0506 01/05/24 0459 01/06/24 0523  CRP 8.2* 3.1* 1.8* 1.4* 1.2*  PROCALCITON 1.64 0.90 0.54 0.34 0.18  TSH  --  2.910  --   --   --   BNP  --  308.7* 159.3* 148.5* 114.0*  MG 1.7 2.0 1.8 1.9 2.0  CALCIUM 9.0 9.0 8.7* 8.4* 8.5*    --------------------------------------------------------------------------------------------------------------- Lab Results  Component Value Date   TRIG 176 (H) 11/03/2022    Lab Results  Component Value Date   HGBA1C 5.0 02/06/2022   No results for input(s): "TSH", "T4TOTAL", "FREET4", "T3FREE", "THYROIDAB" in the last 72 hours.  No results for input(s): "VITAMINB12", "FOLATE", "FERRITIN", "TIBC", "IRON", "RETICCTPCT" in the last 72 hours. ------------------------------------------------------------------------------------------------------------------ Cardiac Enzymes No results for input(s): "CKMB", "TROPONINI", "MYOGLOBIN" in the last 168 hours.  Invalid input(s): "CK"  Micro Results Recent Results (from the past 240 hours)  Urine Culture     Status: Abnormal   Collection Time: 01/01/24 12:36 AM   Specimen: Urine, Clean Catch  Result Value Ref Range Status   Specimen Description URINE, CLEAN CATCH  Final   Special Requests   Final    NONE Performed at [facility], [address], [place], [place] [address]    Culture (A)  Final     >=100,000 COLONIES/mL KLEBSIELLA PNEUMONIAE Confirmed Extended Spectrum Beta-Lactamase Producer (ESBL).  In bloodstream infections from ESBL organisms, carbapenems are preferred over piperacillin/tazobactam. They are shown to have a lower risk of mortality.    Report Status 01/04/2024 FINAL  Final   Organism ID, Bacteria KLEBSIELLA PNEUMONIAE (A)  Final      Susceptibility   Klebsiella pneumoniae - MIC*    AMPICILLIN >=32 RESISTANT Resistant     CEFAZOLIN >=64 RESISTANT Resistant     CEFEPIME >=32 RESISTANT Resistant     CEFTRIAXONE >=64 RESISTANT Resistant     CIPROFLOXACIN >=4 RESISTANT Resistant     GENTAMICIN >=16 RESISTANT Resistant     IMIPENEM <=0.25 SENSITIVE Sensitive     NITROFURANTOIN 128 RESISTANT Resistant     TRIMETH/SULFA >=320 RESISTANT Resistant     AMPICILLIN/SULBACTAM >=32 RESISTANT Resistant     PIP/TAZO 64 INTERMEDIATE Intermediate ug/mL    * >=100,000 COLONIES/mL KLEBSIELLA PNEUMONIAE    Radiology Report No results found.    Signature  -   Huey Bienenstock M.D on 01/06/2024 at 12:38 PM   -  To page go to [email or web address]

## 2024-01-07 DIAGNOSIS — N179 Acute kidney failure, unspecified: Secondary | ICD-10-CM | POA: Diagnosis not present

## 2024-01-07 DIAGNOSIS — N390 Urinary tract infection, site not specified: Secondary | ICD-10-CM | POA: Diagnosis not present

## 2024-01-07 LAB — CBC WITH DIFFERENTIAL/PLATELET
Abs Immature Granulocytes: 1.14 10*3/uL — ABNORMAL HIGH (ref 0.00–0.07)
Basophils Absolute: 0.1 10*3/uL (ref 0.0–0.1)
Basophils Relative: 1 %
Eosinophils Absolute: 2.8 10*3/uL — ABNORMAL HIGH (ref 0.0–0.5)
Eosinophils Relative: 26 %
HCT: 31.7 % — ABNORMAL LOW (ref 36.0–46.0)
Hemoglobin: 10.4 g/dL — ABNORMAL LOW (ref 12.0–15.0)
Immature Granulocytes: 11 %
Lymphocytes Relative: 18 %
Lymphs Abs: 1.9 10*3/uL (ref 0.7–4.0)
MCH: 27.4 pg (ref 26.0–34.0)
MCHC: 32.8 g/dL (ref 30.0–36.0)
MCV: 83.6 fL (ref 80.0–100.0)
Monocytes Absolute: 0.9 10*3/uL (ref 0.1–1.0)
Monocytes Relative: 9 %
Neutro Abs: 3.7 10*3/uL (ref 1.7–7.7)
Neutrophils Relative %: 35 %
Platelets: 199 10*3/uL (ref 150–400)
RBC: 3.79 MIL/uL — ABNORMAL LOW (ref 3.87–5.11)
RDW: 17.9 % — ABNORMAL HIGH (ref 11.5–15.5)
Smear Review: NORMAL
WBC: 10.5 10*3/uL (ref 4.0–10.5)
nRBC: 0 % (ref 0.0–0.2)

## 2024-01-07 LAB — BASIC METABOLIC PANEL
Anion gap: 10 (ref 5–15)
BUN: 56 mg/dL — ABNORMAL HIGH (ref 6–20)
CO2: 21 mmol/L — ABNORMAL LOW (ref 22–32)
Calcium: 8.5 mg/dL — ABNORMAL LOW (ref 8.9–10.3)
Chloride: 103 mmol/L (ref 98–111)
Creatinine, Ser: 2 mg/dL — ABNORMAL HIGH (ref 0.44–1.00)
GFR, Estimated: 29 mL/min — ABNORMAL LOW (ref >60)
Glucose, Bld: 90 mg/dL (ref 70–99)
Potassium: 4.6 mmol/L (ref 3.5–5.1)
Sodium: 134 mmol/L — ABNORMAL LOW (ref 135–145)

## 2024-01-07 LAB — MRSA NEXT GEN BY PCR, NASAL: MRSA by PCR Next Gen: NOT DETECTED

## 2024-01-07 LAB — PROCALCITONIN: Procalcitonin: 0.11 ng/mL

## 2024-01-07 MED ORDER — HYDROMORPHONE HCL 1 MG/ML IJ SOLN
0.5000 mg | Freq: Once | INTRAMUSCULAR | Status: AC
  Administered 2024-01-07: 0.5 mg via INTRAVENOUS
  Filled 2024-01-07: qty 0.5
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
## 2024-01-07 NOTE — Progress Notes (Signed)
 PROGRESS NOTE                                                                                                                                                                                                             Patient Demographics:    Sherry Baker, is a 58 y.o. female, DOB - 1965/11/09, ZOX:[id number]  Outpatient Primary MD for the patient is Storm Frisk, MD    LOS - 6  Admit date - 12/31/2023    Chief Complaint  Patient presents with   Abdominal Pain    Hypotensive       Brief Narrative (HPI from H&P)   58 year old woman with PMH of rectal cancer s/p surgery with colostomy, HTN, GERD, depression, COPD, recurrent UTIs who presented with nausea, vomiting.  Patient reported that she had just finished a course of antibiotics.  Presented with AKI, UA concerning for UTI.  Noted that recent urine cultures grew ESBL Klebsiella.  Patient was started on meropenem.    Subjective:   She denies any complaints today, no dysuria, no polyuria, no suprapubic pain  Assessment  & Plan :   AKI on CKD Baseline creatinine is 1.4-1.5. Clinically prerenal due to nausea vomiting and dehydration improving with IV fluids continue continue gentle IV fluids 01/03/2024, no acute findings on renal ultrasound, renal function has plateaued around 1.8, recheck in 5 to 7 days.   ESBL Klebsiella UTI Urine cultures growing ESBL Klebsiella, continue meropenem for a total of 7 days treatment, stop date 01/07/2024, clinically better, last dose of meropenem today Failed outpatient nitrofurantoin treatment.   Abdominal pain. Vesiculo cutaneous fistula -Imaging on admission admission significant for cystitis, she had suprapubic tenderness, she is with known history of vesicular cutaneous fistula, -Bladder scan has been obtained with low volume, In-N-Out done this morning with 100 cc of clear urine output noted. -Urology input greatly appreciated, no  further workup currently, to [facility].   History of rectal cancer s/p colostomy -Continue colostomy care.   Nighttime sinus bradycardia with a 2-second pause on 01/02/2024.  Was on very high doses of Coreg, stopped, stable TSH, bradycardia has resolved.  HTN  - continue Norvasc, added hydralazine, stop Coreg due to above   COPD -Continue home Anoro Ellipta.   Depression.  No acute issues supportive care.  Chronic sacral decubitus  ulcer present on admission kindly see nursing notes.  Supportive care no acute issues.  Wound care team following.      Condition - Extremely Guarded  Family Communication  : Discussed with cousin at bedside  Code Status :   Full  Consults  :  None  PUD Prophylaxis : PPI   Procedures  :     Renal US - 1. Slightly atrophic right kidney with cortical scarring but no hydronephrosis. Mildly echogenic right renal cortex consistent with medical renal disease. 2. Normal left kidney  CT - 1. No acute localizing process in the abdomen or pelvis. 2. Stable left-sided colostomy. No bowel obstruction. 3. Stable sacrococcygeal decubitus ulcer with erosive changes of the tip of the coccyx. 4. Stable diffuse bladder wall thickening. Correlate clinically for cystitis. 5. Stable chronic appearing reticular opacities in both lung bases. 6. 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 7. Small hiatal hernia. 8. Aortic atherosclerosis. Aortic Atherosclerosis (ICD10-I70.0).      Disposition Plan  :    Status is: Inpatient   DVT Prophylaxis  :    enoxaparin (LOVENOX) injection 30 mg Start: 01/01/24 1400    Lab Results  Component Value Date   PLT 199 01/07/2024    Diet :  Diet Order             Diet Heart Room service appropriate? Yes; Fluid consistency: Thin  Diet effective now                    Inpatient Medications  Scheduled Meds:  amLODipine  10 mg Oral Daily   enoxaparin (LOVENOX) injection  30 mg  Subcutaneous Q24H   feeding supplement  237 mL Oral TID BM   hydrALAZINE  100 mg Oral Q8H   pantoprazole  40 mg Oral Daily   QUEtiapine  50 mg Oral BID   sertraline  50 mg Oral Daily   umeclidinium-vilanterol  1 puff Inhalation Daily   Continuous Infusions:  meropenem (MERREM) IV 500 mg (01/07/24 0857)   PRN Meds:.acetaminophen, albuterol, diphenhydrAMINE, HYDROcodone-acetaminophen, ondansetron (ZOFRAN) IV    Objective:   Vitals:   01/07/24 0140 01/07/24 0400 01/07/24 0856 01/07/24 1138  BP:  (!) 107/52 105/62 101/68  Pulse:    74  Resp: 15 19 16 17   Temp:   98.2 F (36.8 C) 98 F (36.7 C)  TempSrc:   Oral Oral  SpO2:      Weight:      Height:        Wt Readings from Last 3 Encounters:  01/01/24 51.1 kg  12/16/23 49.2 kg  11/16/23 47.6 kg     Intake/Output Summary (Last 24 hours) at 01/07/2024 1321 Last data filed at 01/07/2024 0600 Gross per 24 hour  Intake 1780 ml  Output 1150 ml  Net 630 ml     Physical Exam  Awake Alert, Oriented X 3, No new F.N deficits, Normal affect Symmetrical Chest wall movement, Good air movement bilaterally, CTAB RRR,No Gallops,Rubs or new Murmurs, No Parasternal Heave +ve B.Sounds, Abd Soft, No tenderness, colostomy present No Cyanosis, Clubbing or edema, No new Rash or bruise       RN pressure injury documentation: Pressure Injury 02/03/22 Sacrum Stage 4 - Full thickness tissue loss with exposed bone, tendon or muscle. (Active)  02/03/22   Location: Sacrum  Location Orientation:   Staging: Stage 4 - Full thickness tissue loss with exposed bone, tendon or muscle.  Wound Description (Comments):   Present on Admission:  Yes     Pressure Injury 03/06/22 Sacrum Stage 4 - Full thickness tissue loss with exposed bone, tendon or muscle. (Active)  03/06/22 1900  Location: Sacrum  Location Orientation:   Staging: Stage 4 - Full thickness tissue loss with exposed bone, tendon or muscle.  Wound Description (Comments):   Present on  Admission: Yes     Pressure Injury 04/09/22 Buttocks Right Stage 2 -  Partial thickness loss of dermis presenting as a shallow open injury with a red, pink wound bed without slough. pink, red (Active)  04/09/22 0415  Location: Buttocks  Location Orientation: Right  Staging: Stage 2 -  Partial thickness loss of dermis presenting as a shallow open injury with a red, pink wound bed without slough.  Wound Description (Comments): pink, red  Present on Admission: Yes     Pressure Injury 04/09/22 Sacrum Upper Stage 3 -  Full thickness tissue loss. Subcutaneous fat may be visible but bone, tendon or muscle are NOT exposed. (Active)  04/09/22 0415  Location: Sacrum  Location Orientation: Upper  Staging: Stage 3 -  Full thickness tissue loss. Subcutaneous fat may be visible but bone, tendon or muscle are NOT exposed.  Wound Description (Comments):   Present on Admission: No     Pressure Injury 04/13/22 Buttocks Right Deep Tissue Pressure Injury - Purple or maroon localized area of discolored intact skin or blood-filled blister due to damage of underlying soft tissue from pressure and/or shear. (Active)  04/13/22 2042  Location: Buttocks  Location Orientation: Right  Staging: Deep Tissue Pressure Injury - Purple or maroon localized area of discolored intact skin or blood-filled blister due to damage of underlying soft tissue from pressure and/or shear.  Wound Description (Comments):   Present on Admission: No     Pressure Injury 04/13/22 Toe (Comment  which one) Anterior;Right Deep Tissue Pressure Injury - Purple or maroon localized area of discolored intact skin or blood-filled blister due to damage of underlying soft tissue from pressure and/or shear. (Active)  04/13/22 2042  Location: Toe (Comment  which one)  Location Orientation: Anterior;Right  Staging: Deep Tissue Pressure Injury - Purple or maroon localized area of discolored intact skin or blood-filled blister due to damage of underlying  soft tissue from pressure and/or shear.  Wound Description (Comments):   Present on Admission:      Pressure Injury 06/11/22 Buttocks Left Stage 2 -  Partial thickness loss of dermis presenting as a shallow open injury with a red, pink wound bed without slough. (Active)  06/11/22 1257  Location: Buttocks  Location Orientation: Left  Staging: Stage 2 -  Partial thickness loss of dermis presenting as a shallow open injury with a red, pink wound bed without slough.  Wound Description (Comments):   Present on Admission: No     Pressure Injury 06/11/22 Buttocks Right Stage 2 -  Partial thickness loss of dermis presenting as a shallow open injury with a red, pink wound bed without slough. (Active)  06/11/22 1258  Location: Buttocks  Location Orientation: Right  Staging: Stage 2 -  Partial thickness loss of dermis presenting as a shallow open injury with a red, pink wound bed without slough.  Wound Description (Comments):   Present on Admission:      Pressure Injury 10/26/22 Coccyx Mid Stage 4 - Full thickness tissue loss with exposed bone, tendon or muscle. (Active)  10/26/22 1430  Location: Coccyx  Location Orientation: Mid  Staging: Stage 4 - Full thickness tissue loss with exposed bone,  tendon or muscle.  Wound Description (Comments):   Present on Admission: Yes     Pressure Injury 10/26/22 Knee Anterior;Left Deep Tissue Pressure Injury - Purple or maroon localized area of discolored intact skin or blood-filled blister due to damage of underlying soft tissue from pressure and/or shear. (Active)  10/26/22 1330  Location: Knee  Location Orientation: Anterior;Left  Staging: Deep Tissue Pressure Injury - Purple or maroon localized area of discolored intact skin or blood-filled blister due to damage of underlying soft tissue from pressure and/or shear.  Wound Description (Comments):   Present on Admission: Yes     Pressure Injury 10/28/22 Elbow Posterior;Right Stage 1 -  Intact skin with  non-blanchable redness of a localized area usually over a bony prominence. (Active)  10/28/22 1235  Location: Elbow  Location Orientation: Posterior;Right  Staging: Stage 1 -  Intact skin with non-blanchable redness of a localized area usually over a bony prominence.  Wound Description (Comments):   Present on Admission: Yes     Pressure Injury 10/28/22 Elbow Left;Posterior Stage 1 -  Intact skin with non-blanchable redness of a localized area usually over a bony prominence. (Active)  10/28/22 1235  Location: Elbow  Location Orientation: Left;Posterior  Staging: Stage 1 -  Intact skin with non-blanchable redness of a localized area usually over a bony prominence.  Wound Description (Comments):   Present on Admission:      Pressure Injury 01/01/24 Coccyx Mid Deep Open Red Wound (Active)  01/01/24 1800  Location: Coccyx  Location Orientation: Mid  Staging:   Wound Description (Comments): Deep Open Red Wound  Present on Admission: Yes  Dressing Type Moist to dry 01/07/24 0856      Data Review:    Recent Labs  Lab 01/03/24 0606 01/04/24 0506 01/05/24 0459 01/06/24 0523 01/07/24 0559  WBC 8.7 10.1 10.0 9.6 10.5  HGB 10.6* 10.8* 10.9* 10.6* 10.4*  HCT 33.3* 33.1* 32.9* 31.5* 31.7*  PLT 186 206 199 192 199  MCV 86.5 83.8 83.3 83.6 83.6  MCH 27.5 27.3 27.6 28.1 27.4  MCHC 31.8 32.6 33.1 33.7 32.8  RDW 18.5* 18.1* 18.0* 18.0* 17.9*  LYMPHSABS 0.8 2.1 2.2 1.4 1.9  MONOABS 0.2 0.7 1.0 0.6 0.9  EOSABS 2.8* 2.4* 2.4* 3.0* 2.8*  BASOSABS 0.0 0.0 0.0 0.1 0.1    Recent Labs  Lab 12/31/23 2017 01/01/24 0230 01/01/24 0230 01/02/24 1610 01/03/24 0606 01/04/24 0506 01/05/24 0459 01/06/24 0523 01/07/24 0559  NA 133* 134*  --  136 135 134* 133* 134* 134*  K 4.5 4.1  --  4.4 4.7 5.1 4.8 4.6 4.6  CL 104 110  --  108 106 101 100 101 103  CO2 15* 15*  --  18* 20* 24 25 20* 21*  ANIONGAP 14 9  --  10 9 9 8 13 10   GLUCOSE 87 108*  --  112* 110* 97 93 114* 90  BUN 30* 32*  --   32* 33* 37* 41* 49* 56*  CREATININE 2.22* 2.17*  --  1.91* 1.75* 1.81* 1.82* 1.83* 2.00*  AST 15 10*  --   --   --   --   --   --   --   ALT 10 8  --   --   --   --   --   --   --   ALKPHOS 67 56  --   --   --   --   --   --   --  BILITOT 0.3 0.4  --   --   --   --   --   --   --   ALBUMIN 2.8* 2.1*  --   --   --   --   --   --   --   CRP  --   --   --  8.2* 3.1* 1.8* 1.4* 1.2*  --   PROCALCITON  --   --    < > 1.64 0.90 0.54 0.34 0.18 0.11  TSH  --   --   --   --  2.910  --   --   --   --   BNP  --   --   --   --  308.7* 159.3* 148.5* 114.0*  --   MG  --   --   --  1.7 2.0 1.8 1.9 2.0  --   PHOS  --   --   --  3.8  --   --   --   --   --   CALCIUM 8.7* 7.9*  --  9.0 9.0 8.7* 8.4* 8.5* 8.5*   < > = values in this interval not displayed.      Recent Labs  Lab 01/02/24 0613 01/03/24 0606 01/04/24 0506 01/05/24 0459 01/06/24 0523 01/07/24 0559  CRP 8.2* 3.1* 1.8* 1.4* 1.2*  --   PROCALCITON 1.64 0.90 0.54 0.34 0.18 0.11  TSH  --  2.910  --   --   --   --   BNP  --  308.7* 159.3* 148.5* 114.0*  --   MG 1.7 2.0 1.8 1.9 2.0  --   CALCIUM 9.0 9.0 8.7* 8.4* 8.5* 8.5*    --------------------------------------------------------------------------------------------------------------- Lab Results  Component Value Date   TRIG 176 (H) 11/03/2022    Lab Results  Component Value Date   HGBA1C 5.0 02/06/2022   No results for input(s): "TSH", "T4TOTAL", "FREET4", "T3FREE", "THYROIDAB" in the last 72 hours.  No results for input(s): "VITAMINB12", "FOLATE", "FERRITIN", "TIBC", "IRON", "RETICCTPCT" in the last 72 hours. ------------------------------------------------------------------------------------------------------------------ Cardiac Enzymes No results for input(s): "CKMB", "TROPONINI", "MYOGLOBIN" in the last 168 hours.  Invalid input(s): "CK"  Micro Results Recent Results (from the past 240 hours)  Urine Culture     Status: Abnormal   Collection Time: 01/01/24 12:36 AM    Specimen: Urine, Clean Catch  Result Value Ref Range Status   Specimen Description URINE, CLEAN CATCH  Final   Special Requests   Final    NONE Performed at [facility], [address] [place], [place] [address]    Culture (A)  Final    >=100,000 COLONIES/mL KLEBSIELLA PNEUMONIAE Confirmed Extended Spectrum Beta-Lactamase Producer (ESBL).  In bloodstream infections from ESBL organisms, carbapenems are preferred over piperacillin/tazobactam. They are shown to have a lower risk of mortality.    Report Status 01/04/2024 FINAL  Final   Organism ID, Bacteria KLEBSIELLA PNEUMONIAE (A)  Final      Susceptibility   Klebsiella pneumoniae - MIC*    AMPICILLIN >=32 RESISTANT Resistant     CEFAZOLIN >=64 RESISTANT Resistant     CEFEPIME >=32 RESISTANT Resistant     CEFTRIAXONE >=64 RESISTANT Resistant     CIPROFLOXACIN >=4 RESISTANT Resistant     GENTAMICIN >=16 RESISTANT Resistant     IMIPENEM <=0.25 SENSITIVE Sensitive     NITROFURANTOIN 128 RESISTANT Resistant     TRIMETH/SULFA >=320 RESISTANT Resistant     AMPICILLIN/SULBACTAM >=32 RESISTANT Resistant  PIP/TAZO 64 INTERMEDIATE Intermediate ug/mL    * >=100,000 COLONIES/mL KLEBSIELLA PNEUMONIAE    Radiology Report No results found.    Signature  -   Huey Bienenstock M.D on 01/07/2024 at 1:21 PM   -  To page go to [email or web address]

## 2024-01-07 NOTE — Progress Notes (Signed)
   01/07/24 1431  Mobility  Activity Ambulated with assistance in hallway  Level of Assistance Contact guard assist, steadying assist  Assistive Device Front wheel walker  Distance Ambulated (ft) 150 ft  Activity Response Tolerated fair  Mobility Referral Yes  Mobility visit 1 Mobility  Mobility Specialist Start Time (ACUTE ONLY) 1413  Mobility Specialist Stop Time (ACUTE ONLY) 1431  Mobility Specialist Time Calculation (min) (ACUTE ONLY) 18 min   Mobility Specialist: Progress Note  Pt agreeable to mobility session - received in bed. C/o pain in stomach and pelvic area. Returned to chair with all needs met - call bell within reach.   Barnie Mort, BS Mobility Specialist Please contact via SecureChat or  Rehab office at [phone].

## 2024-01-08 ENCOUNTER — Inpatient Hospital Stay (HOSPITAL_COMMUNITY): Payer: MEDICAID

## 2024-01-08 DIAGNOSIS — N179 Acute kidney failure, unspecified: Secondary | ICD-10-CM | POA: Diagnosis not present

## 2024-01-08 MED ORDER — OXYCODONE HCL 5 MG PO TABS
5.0000 mg | ORAL_TABLET | Freq: Once | ORAL | Status: AC
  Administered 2024-01-08: 5 mg via ORAL
  Filled 2024-01-08: qty 1

## 2024-01-08 NOTE — TOC Progression Note (Addendum)
 Transition of Care [facility]) - Progression Note    Patient Details  Name: Sherry Baker MRN: [id number] Date of Birth: 10/17/1966  [facility]) CM/SW Contact  Mearl Latin, LCSW Phone Number: 01/08/2024, 9:23 AM  Clinical Narrative:    9:23 AM-CSW received call from Perlie Gold with [employer]. He will come assess patient at 3pm today.   SNF insurance auth still pending.   CSW spoke with patient and provided update. She is agreeable to continue to wait for SNF.   Expected Discharge Plan: Skilled Nursing Facility Barriers to Discharge: Continued Medical Work up, English as a second language teacher, SNF Pending bed offer  Expected Discharge Plan and Services     Post Acute Care Choice: Skilled Nursing Facility Living arrangements for the past 2 months: Single Family Home                                       Social Determinants of Health (SDOH) Interventions SDOH Screenings   Food Insecurity: Patient Unable To Answer (01/03/2024)  Recent Concern: Food Insecurity - Food Insecurity Present (10/12/2023)  Housing: Patient Unable To Answer (01/03/2024)  Recent Concern: Housing - Medium Risk (10/12/2023)  Transportation Needs: Patient Unable To Answer (01/03/2024)  Recent Concern: Transportation Needs - Unmet Transportation Needs (10/12/2023)  Utilities: Patient Unable To Answer (01/03/2024)  Depression (PHQ2-9): High Risk (12/16/2023)  Financial Resource Strain: Low Risk  (10/12/2023)  Physical Activity: Inactive (10/12/2023)  Social Connections: Socially Isolated (10/12/2023)  Stress: Stress Concern Present (10/12/2023)  Tobacco Use: High Risk (01/06/2024)  Health Literacy: Adequate Health Literacy (10/12/2023)    Readmission Risk Interventions    06/16/2022    4:54 PM 04/17/2022   11:21 AM 12/10/2021    1:58 PM  Readmission Risk Prevention Plan  Transportation Screening Complete Complete Complete  Medication Review (RN Care Manager)  Complete Complete  PCP or Specialist appointment within  3-5 days of discharge Not Complete Complete Complete  PCP/Specialist Appt Not Complete comments patient needs to make apt    HRI or Home Care Consult Complete Complete Not Complete  HRI or Home Care Consult Pt Refusal Comments   history of unsafe home environment  SW Recovery Care/Counseling Consult  Complete Complete  Palliative Care Screening  Not Applicable Not Applicable  Skilled Nursing Facility Patient Refused Patient Refused Not Applicable

## 2024-01-08 NOTE — Progress Notes (Signed)
   01/08/24 1212  Mobility  Activity Ambulated with assistance in hallway  Level of Assistance Contact guard assist, steadying assist  Assistive Device Front wheel walker  Distance Ambulated (ft) 150 ft  Activity Response Tolerated fair  Mobility Referral Yes  Mobility visit 1 Mobility  Mobility Specialist Start Time (ACUTE ONLY) 1149  Mobility Specialist Stop Time (ACUTE ONLY) 1212  Mobility Specialist Time Calculation (min) (ACUTE ONLY) 23 min   Mobility Specialist: Progress Note  Pre-Mobility:      HR 79 During Mobility: HR 130 Post-Mobility:    HR 94  Pt agreeable to mobility session - received in bed. C/o abd pain rated 8/10. Returned to bed with all needs met - call bell within reach  Barnie Mort, BS Mobility Specialist Please contact via SecureChat or  Rehab office at [phone].

## 2024-01-08 NOTE — Progress Notes (Signed)
 Physical Therapy Treatment Patient Details Name: Sherry Baker MRN: [id number] DOB: 08-Jun-1966 Today's Date: 01/08/2024   History of Present Illness 58 year old woman who presented 2/27 with nausea, vomiting. Dx: AKI, UA concerning for UTI.  Noted that recent urine cultures grew ESBL Klebsiella. PMH of rectal cancer s/p surgery with colostomy, HTN, GERD, depression, COPD, recurrent UTIs.    PT Comments  Continues to progress well towards acute functional goals. HR into 130s today with gait, fatigued by end of distance. Supervision with RW, no loss of balance. CGA without assistive device, demonstrating significant impairment exacerbation of gait mechanics without UE support. Reviewed LE exercises and encouraged to perform between therapy sessions. Has been compliant with pressure relief regularly for sacrum. Patient will continue to benefit from skilled physical therapy services to further improve independence with functional mobility.    If plan is discharge home, recommend the following: A little help with walking and/or transfers;A little help with bathing/dressing/bathroom;Assistance with cooking/housework;Assist for transportation   Can travel by private vehicle     Yes  Equipment Recommendations  Rolling walker (2 wheels)    Recommendations for Other Services       Precautions / Restrictions Precautions Precautions: Fall Recall of Precautions/Restrictions: Intact Precaution/Restrictions Comments: monitor O2 Restrictions Weight Bearing Restrictions Per Provider Order: No     Mobility  Bed Mobility Overal bed mobility: Modified Independent             General bed mobility comments: Mod I with bed mobility    Transfers Overall transfer level: Needs assistance Equipment used: Rolling walker (2 wheels) Transfers: Sit to/from Stand Sit to Stand: Supervision           General transfer comment: Supervision for safety from edge of bed, no physical assist needed.  Dependent on RW to stabilize. Cues to widen feet prior to rising.    Ambulation/Gait Ambulation/Gait assistance: Supervision, Contact guard assist Gait Distance (Feet): 150 Feet Assistive device: Rolling walker (2 wheels), 1 person hand held assist Gait Pattern/deviations: Decreased stride length, Shuffle, Antalgic, Drifts right/left, Decreased step length - right, Decreased step length - left, Decreased dorsiflexion - right, Trunk flexed Gait velocity: decr Gait velocity interpretation: <1.8 ft/sec, indicate of risk for recurrent falls   General Gait Details: Supervision for safety, trialed without RW, required CGA with notable breakdown of symmetry, shuffling steps, narrowing BOS, weight mostly on heels - provided hand held support. With RW replaced pt stride improves however is still shortened. No overt LOB. HR increased to low 130s. She is fatigued by end of distance and needed to sit. SpO2 92% on RA at lowest recording.   Stairs             Wheelchair Mobility     Tilt Bed    Modified Rankin (Stroke Patients Only)       Balance Overall balance assessment: Needs assistance Sitting-balance support: No upper extremity supported, Feet supported Sitting balance-Leahy Scale: Good     Standing balance support: No upper extremity supported, During functional activity Standing balance-Leahy Scale: Fair Standing balance comment: CGA without UE support. Some increased sway                            Communication Communication Communication: No apparent difficulties  Cognition Arousal: Alert Behavior During Therapy: WFL for tasks assessed/performed   PT - Cognitive impairments: No apparent impairments  Following commands: Intact      Cueing Cueing Techniques: Verbal cues, Gestural cues  Exercises General Exercises - Lower Extremity Ankle Circles/Pumps: AROM, Both, 10 reps, Supine Quad Sets: Strengthening, Both, 10 reps,  Supine Gluteal Sets: Strengthening, Both, 10 reps, Supine Hip ABduction/ADduction: Strengthening, Both, 10 reps, Supine Straight Leg Raises: Strengthening, Both, 10 reps, Supine Other Exercises Other Exercises: Reviewed importance of scheduled weight shifting every 20-30 minutes while in chair. Use of geopad.    General Comments General comments (skin integrity, edema, etc.): HR 130s with gait      Pertinent Vitals/Pain Pain Assessment Pain Assessment: Faces Faces Pain Scale: Hurts even more Pain Location: buttocks Pain Descriptors / Indicators: Sore, Guarding Pain Intervention(s): Monitored during session, Repositioned, Premedicated before session    Home Living                          Prior Function            PT Goals (current goals can now be found in the care plan section) Acute Rehab PT Goals Patient Stated Goal: Get well PT Goal Formulation: With patient Time For Goal Achievement: 01/16/24 Potential to Achieve Goals: Good Progress towards PT goals: Progressing toward goals    Frequency    Min 3X/week      PT Plan      Co-evaluation              AM-PAC PT "6 Clicks" Mobility   Outcome Measure  Help needed turning from your back to your side while in a flat bed without using bedrails?: None Help needed moving from lying on your back to sitting on the side of a flat bed without using bedrails?: None Help needed moving to and from a bed to a chair (including a wheelchair)?: A Little Help needed standing up from a chair using your arms (e.g., wheelchair or bedside chair)?: A Little Help needed to walk in hospital room?: A Little Help needed climbing 3-5 steps with a railing? : A Little 6 Click Score: 20    End of Session Equipment Utilized During Treatment: Gait belt Activity Tolerance: Patient tolerated treatment well Patient left: with call bell/phone within reach;in bed;with bed alarm set Nurse Communication: Mobility status PT Visit  Diagnosis: Unsteadiness on feet (R26.81);Other abnormalities of gait and mobility (R26.89);Muscle weakness (generalized) (M62.81);Difficulty in walking, not elsewhere classified (R26.2);Pain Pain - part of body:  (buttock)     Time: 9528-4132 PT Time Calculation (min) (ACUTE ONLY): 15 min  Charges:    $Gait Training: 8-22 mins PT General Charges $$ ACUTE PT VISIT: 1 Visit                     Kathlyn Sacramento, PT, DPT [facility]  Rehabilitation Services Physical Therapist Office: [phone] Website: [email or web address]    Berton Mount 01/08/2024, 4:41 PM

## 2024-01-08 NOTE — Plan of Care (Signed)
   Problem: Education: Goal: Knowledge of General Education information will improve Description Including pain rating scale, medication(s)/side effects and non-pharmacologic comfort measures Outcome: Progressing   Problem: Health Behavior/Discharge Planning: Goal: Ability to manage health-related needs will improve Outcome: Progressing

## 2024-01-08 NOTE — Progress Notes (Signed)
 PROGRESS NOTE                                                                                                                                                                                                             Patient Demographics:    Sherry Baker, is a 58 y.o. female, DOB - June 12, 1966, NUU:[id number]  Outpatient Primary MD for the patient is Storm Frisk, MD    LOS - 7  Admit date - 12/31/2023    Chief Complaint  Patient presents with   Abdominal Pain    Hypotensive       Brief Narrative (HPI from H&P)    58 year old woman with PMH of rectal cancer s/p surgery with colostomy, HTN, GERD, depression, COPD, recurrent UTIs who presented with nausea, vomiting.  Patient reported that she had just finished a course of antibiotics.  Presented with AKI, UA concerning for UTI.  Noted that recent urine cultures grew ESBL Klebsiella.  Patient was started on meropenem.    Subjective:   No significant events overnight, she is afebrile, good appetite, no nausea, no vomiting, complains of lower back pain today   Assessment  & Plan :   AKI on CKD - Baseline creatinine is 1.4-1.5. -Renal, due to nausea, vomiting and dehydration. - no acute findings on renal ultrasound   ESBL Klebsiella UTI Urine cultures growing ESBL Klebsiella -Treated 7 days on IV meropenem. -failed outpatient nitrofurantoin treatment.   Abdominal pain. Vesiculo cutaneous fistula -Imaging on admission admission significant for cystitis, she had suprapubic tenderness, she is with known history of vesicular cutaneous fistula, -Bladder scan has been obtained with low volume, In-N-Out done this morning with 100 cc of clear urine output noted. -Urology input greatly appreciated, no further workup currently, to [facility]-up with [facility] as [facility].   History of rectal cancer s/p colostomy -Continue colostomy care.   Nighttime sinus bradycardia with  a 2-second pause on 01/02/2024.   -Resolved after holding her high-dose Coreg at home - stable TSH, bradycardia has resolved.  HTN   - continue Norvasc, added hydralazine, stop Coreg due to above   COPD -Continue home Anoro Ellipta.   Depression.   -No acute issues supportive care.  Pulmonary nodules and fibrosis -she is following with pulmonary as an outpatient, was due for repeat CT chest, obtained during hospital stay, right upper  lobe nodules appear stable, new 5 mm right lower lobe, recommendation for 47-month follow-up. -Stable underlining interstitial septal thickening with some peripheral fibrosis -Patient was instructed to keep her outpatient pulmonary follow-up.   Chronic sacral decubitus ulcer present on admission kindly see nursing notes.  Supportive care no acute issues.  Wound care team following.      Condition - Extremely Guarded  Family Communication  : None at bedside  Code Status :   Full  Consults  : Urology  PUD Prophylaxis : PPI   Procedures  :     Renal US - 1. Slightly atrophic right kidney with cortical scarring but no hydronephrosis. Mildly echogenic right renal cortex consistent with medical renal disease. 2. Normal left kidney  CT - 1. No acute localizing process in the abdomen or pelvis. 2. Stable left-sided colostomy. No bowel obstruction. 3. Stable sacrococcygeal decubitus ulcer with erosive changes of the tip of the coccyx. 4. Stable diffuse bladder wall thickening. Correlate clinically for cystitis. 5. Stable chronic appearing reticular opacities in both lung bases. 6. 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 7. Small hiatal hernia. 8. Aortic atherosclerosis. Aortic Atherosclerosis (ICD10-I70.0).      Disposition Plan  :    Status is: Inpatient   DVT Prophylaxis  :    enoxaparin (LOVENOX) injection 30 mg Start: 01/01/24 1400    Lab Results  Component Value Date   PLT 199 01/07/2024    Diet :  Diet Order              Diet Heart Room service appropriate? Yes; Fluid consistency: Thin  Diet effective now                    Inpatient Medications  Scheduled Meds:  amLODipine  10 mg Oral Daily   enoxaparin (LOVENOX) injection  30 mg Subcutaneous Q24H   feeding supplement  237 mL Oral TID BM   hydrALAZINE  100 mg Oral Q8H   pantoprazole  40 mg Oral Daily   QUEtiapine  50 mg Oral BID   sertraline  50 mg Oral Daily   umeclidinium-vilanterol  1 puff Inhalation Daily   Continuous Infusions:   PRN Meds:.acetaminophen, albuterol, diphenhydrAMINE, HYDROcodone-acetaminophen, ondansetron (ZOFRAN) IV    Objective:   Vitals:   01/07/24 1936 01/07/24 2323 01/08/24 0457 01/08/24 0954  BP: 129/73 126/82 (!) 121/110 118/75  Pulse:      Resp:      Temp: 98.5 F (36.9 C) (!) 97.3 F (36.3 C) 98.4 F (36.9 C) 98.2 F (36.8 C)  TempSrc: Oral Oral Oral   SpO2:    99%  Weight: 51.4 kg     Height:        Wt Readings from Last 3 Encounters:  01/07/24 51.4 kg  12/16/23 49.2 kg  11/16/23 47.6 kg     Intake/Output Summary (Last 24 hours) at 01/08/2024 1248 Last data filed at 01/08/2024 0539 Gross per 24 hour  Intake --  Output 1400 ml  Net -1400 ml     Physical Exam  Awake Alert, Oriented X 3, No new F.N deficits, Normal affect Symmetrical Chest wall movement, Good air movement bilaterally, mild rales at the bases RRR,No Gallops,Rubs or new Murmurs, No Parasternal Heave +ve B.Sounds, Abd Soft, No tenderness, colostomy present No Cyanosis, Clubbing or edema, No new Rash or bruise       RN pressure injury documentation: Pressure Injury 02/03/22 Sacrum Stage 4 - Full thickness tissue loss with exposed bone, tendon  or muscle. (Active)  02/03/22   Location: Sacrum  Location Orientation:   Staging: Stage 4 - Full thickness tissue loss with exposed bone, tendon or muscle.  Wound Description (Comments):   Present on Admission: Yes     Pressure Injury 03/06/22 Sacrum Stage 4 - Full  thickness tissue loss with exposed bone, tendon or muscle. (Active)  03/06/22 1900  Location: Sacrum  Location Orientation:   Staging: Stage 4 - Full thickness tissue loss with exposed bone, tendon or muscle.  Wound Description (Comments):   Present on Admission: Yes     Pressure Injury 04/09/22 Buttocks Right Stage 2 -  Partial thickness loss of dermis presenting as a shallow open injury with a red, pink wound bed without slough. pink, red (Active)  04/09/22 0415  Location: Buttocks  Location Orientation: Right  Staging: Stage 2 -  Partial thickness loss of dermis presenting as a shallow open injury with a red, pink wound bed without slough.  Wound Description (Comments): pink, red  Present on Admission: Yes     Pressure Injury 04/09/22 Sacrum Upper Stage 3 -  Full thickness tissue loss. Subcutaneous fat may be visible but bone, tendon or muscle are NOT exposed. (Active)  04/09/22 0415  Location: Sacrum  Location Orientation: Upper  Staging: Stage 3 -  Full thickness tissue loss. Subcutaneous fat may be visible but bone, tendon or muscle are NOT exposed.  Wound Description (Comments):   Present on Admission: No     Pressure Injury 04/13/22 Buttocks Right Deep Tissue Pressure Injury - Purple or maroon localized area of discolored intact skin or blood-filled blister due to damage of underlying soft tissue from pressure and/or shear. (Active)  04/13/22 2042  Location: Buttocks  Location Orientation: Right  Staging: Deep Tissue Pressure Injury - Purple or maroon localized area of discolored intact skin or blood-filled blister due to damage of underlying soft tissue from pressure and/or shear.  Wound Description (Comments):   Present on Admission: No     Pressure Injury 04/13/22 Toe (Comment  which one) Anterior;Right Deep Tissue Pressure Injury - Purple or maroon localized area of discolored intact skin or blood-filled blister due to damage of underlying soft tissue from pressure and/or  shear. (Active)  04/13/22 2042  Location: Toe (Comment  which one)  Location Orientation: Anterior;Right  Staging: Deep Tissue Pressure Injury - Purple or maroon localized area of discolored intact skin or blood-filled blister due to damage of underlying soft tissue from pressure and/or shear.  Wound Description (Comments):   Present on Admission:      Pressure Injury 06/11/22 Buttocks Left Stage 2 -  Partial thickness loss of dermis presenting as a shallow open injury with a red, pink wound bed without slough. (Active)  06/11/22 1257  Location: Buttocks  Location Orientation: Left  Staging: Stage 2 -  Partial thickness loss of dermis presenting as a shallow open injury with a red, pink wound bed without slough.  Wound Description (Comments):   Present on Admission: No     Pressure Injury 06/11/22 Buttocks Right Stage 2 -  Partial thickness loss of dermis presenting as a shallow open injury with a red, pink wound bed without slough. (Active)  06/11/22 1258  Location: Buttocks  Location Orientation: Right  Staging: Stage 2 -  Partial thickness loss of dermis presenting as a shallow open injury with a red, pink wound bed without slough.  Wound Description (Comments):   Present on Admission:      Pressure Injury 10/26/22 Coccyx  Mid Stage 4 - Full thickness tissue loss with exposed bone, tendon or muscle. (Active)  10/26/22 1430  Location: Coccyx  Location Orientation: Mid  Staging: Stage 4 - Full thickness tissue loss with exposed bone, tendon or muscle.  Wound Description (Comments):   Present on Admission: Yes     Pressure Injury 10/26/22 Knee Anterior;Left Deep Tissue Pressure Injury - Purple or maroon localized area of discolored intact skin or blood-filled blister due to damage of underlying soft tissue from pressure and/or shear. (Active)  10/26/22 1330  Location: Knee  Location Orientation: Anterior;Left  Staging: Deep Tissue Pressure Injury - Purple or maroon localized area  of discolored intact skin or blood-filled blister due to damage of underlying soft tissue from pressure and/or shear.  Wound Description (Comments):   Present on Admission: Yes     Pressure Injury 10/28/22 Elbow Posterior;Right Stage 1 -  Intact skin with non-blanchable redness of a localized area usually over a bony prominence. (Active)  10/28/22 1235  Location: Elbow  Location Orientation: Posterior;Right  Staging: Stage 1 -  Intact skin with non-blanchable redness of a localized area usually over a bony prominence.  Wound Description (Comments):   Present on Admission: Yes     Pressure Injury 10/28/22 Elbow Left;Posterior Stage 1 -  Intact skin with non-blanchable redness of a localized area usually over a bony prominence. (Active)  10/28/22 1235  Location: Elbow  Location Orientation: Left;Posterior  Staging: Stage 1 -  Intact skin with non-blanchable redness of a localized area usually over a bony prominence.  Wound Description (Comments):   Present on Admission:      Pressure Injury 01/01/24 Coccyx Mid Deep Open Red Wound (Active)  01/01/24 1800  Location: Coccyx  Location Orientation: Mid  Staging:   Wound Description (Comments): Deep Open Red Wound  Present on Admission: Yes  Dressing Type Moist to dry 01/07/24 0856      Data Review:    Recent Labs  Lab 01/03/24 0606 01/04/24 0506 01/05/24 0459 01/06/24 0523 01/07/24 0559  WBC 8.7 10.1 10.0 9.6 10.5  HGB 10.6* 10.8* 10.9* 10.6* 10.4*  HCT 33.3* 33.1* 32.9* 31.5* 31.7*  PLT 186 206 199 192 199  MCV 86.5 83.8 83.3 83.6 83.6  MCH 27.5 27.3 27.6 28.1 27.4  MCHC 31.8 32.6 33.1 33.7 32.8  RDW 18.5* 18.1* 18.0* 18.0* 17.9*  LYMPHSABS 0.8 2.1 2.2 1.4 1.9  MONOABS 0.2 0.7 1.0 0.6 0.9  EOSABS 2.8* 2.4* 2.4* 3.0* 2.8*  BASOSABS 0.0 0.0 0.0 0.1 0.1    Recent Labs  Lab 01/02/24 0613 01/03/24 0606 01/04/24 0506 01/05/24 0459 01/06/24 0523 01/07/24 0559  NA 136 135 134* 133* 134* 134*  K 4.4 4.7 5.1 4.8 4.6 4.6   CL 108 106 101 100 101 103  CO2 18* 20* 24 25 20* 21*  ANIONGAP 10 9 9 8 13 10   GLUCOSE 112* 110* 97 93 114* 90  BUN 32* 33* 37* 41* 49* 56*  CREATININE 1.91* 1.75* 1.81* 1.82* 1.83* 2.00*  CRP 8.2* 3.1* 1.8* 1.4* 1.2*  --   PROCALCITON 1.64 0.90 0.54 0.34 0.18 0.11  TSH  --  2.910  --   --   --   --   BNP  --  308.7* 159.3* 148.5* 114.0*  --   MG 1.7 2.0 1.8 1.9 2.0  --   PHOS 3.8  --   --   --   --   --   CALCIUM 9.0 9.0 8.7* 8.4* 8.5* 8.5*  Recent Labs  Lab 01/02/24 0613 01/03/24 0606 01/04/24 0506 01/05/24 0459 01/06/24 0523 01/07/24 0559  CRP 8.2* 3.1* 1.8* 1.4* 1.2*  --   PROCALCITON 1.64 0.90 0.54 0.34 0.18 0.11  TSH  --  2.910  --   --   --   --   BNP  --  308.7* 159.3* 148.5* 114.0*  --   MG 1.7 2.0 1.8 1.9 2.0  --   CALCIUM 9.0 9.0 8.7* 8.4* 8.5* 8.5*    --------------------------------------------------------------------------------------------------------------- Lab Results  Component Value Date   TRIG 176 (H) 11/03/2022    Lab Results  Component Value Date   HGBA1C 5.0 02/06/2022   No results for input(s): "TSH", "T4TOTAL", "FREET4", "T3FREE", "THYROIDAB" in the last 72 hours.  No results for input(s): "VITAMINB12", "FOLATE", "FERRITIN", "TIBC", "IRON", "RETICCTPCT" in the last 72 hours. ------------------------------------------------------------------------------------------------------------------ Cardiac Enzymes No results for input(s): "CKMB", "TROPONINI", "MYOGLOBIN" in the last 168 hours.  Invalid input(s): "CK"  Micro Results Recent Results (from the past 240 hours)  Urine Culture     Status: Abnormal   Collection Time: 01/01/24 12:36 AM   Specimen: Urine, Clean Catch  Result Value Ref Range Status   Specimen Description URINE, CLEAN CATCH  Final   Special Requests   Final    NONE Performed at [facility], [address] [place], [place] [address]    Culture (A)  Final    >=100,000 COLONIES/mL KLEBSIELLA  PNEUMONIAE Confirmed Extended Spectrum Beta-Lactamase Producer (ESBL).  In bloodstream infections from ESBL organisms, carbapenems are preferred over piperacillin/tazobactam. They are shown to have a lower risk of mortality.    Report Status 01/04/2024 FINAL  Final   Organism ID, Bacteria KLEBSIELLA PNEUMONIAE (A)  Final      Susceptibility   Klebsiella pneumoniae - MIC*    AMPICILLIN >=32 RESISTANT Resistant     CEFAZOLIN >=64 RESISTANT Resistant     CEFEPIME >=32 RESISTANT Resistant     CEFTRIAXONE >=64 RESISTANT Resistant     CIPROFLOXACIN >=4 RESISTANT Resistant     GENTAMICIN >=16 RESISTANT Resistant     IMIPENEM <=0.25 SENSITIVE Sensitive     NITROFURANTOIN 128 RESISTANT Resistant     TRIMETH/SULFA >=320 RESISTANT Resistant     AMPICILLIN/SULBACTAM >=32 RESISTANT Resistant     PIP/TAZO 64 INTERMEDIATE Intermediate ug/mL    * >=100,000 COLONIES/mL KLEBSIELLA PNEUMONIAE  MRSA Next Gen by PCR, Nasal     Status: None   Collection Time: 01/07/24  8:34 PM   Specimen: Nasal Mucosa; Nasal Swab  Result Value Ref Range Status   MRSA by PCR Next Gen NOT DETECTED NOT DETECTED Final    Comment: (NOTE) The GeneXpert MRSA Assay (FDA approved for NASAL specimens only), is one component of a comprehensive MRSA colonization surveillance program. It is not intended to diagnose MRSA infection nor to guide or monitor treatment for MRSA infections. Test performance is not FDA approved in patients less than 48 years old. Performed at [facility], [address], [place], [place] [address]     Radiology Report CT CHEST WO CONTRAST Result Date: 01/08/2024 CLINICAL DATA:  Lung nodules. EXAM: CT CHEST WITHOUT CONTRAST TECHNIQUE: Multidetector CT imaging of the chest was performed following the standard protocol without IV contrast. RADIATION DOSE REDUCTION: This exam was performed according to the departmental dose-optimization program which includes automated exposure control, adjustment of  the mA and/or kV according to patient size and/or use of iterative reconstruction technique. COMPARISON:  CT 03/06/2022. FINDINGS: Cardiovascular: Small pericardial  effusion, new from prior CT scan. Coronary calcifications are seen. Heart is nonenlarged. The thoracic aorta is normal course and caliber with scattered vascular calcifications. Mediastinum/Nodes: No specific abnormal lymph node enlargement identified in the axillary regions, hilum or mediastinum. A few small less than 1 cm size mediastinal nodes are seen, not pathologic by size criteria and unchanged. Preserved thyroid gland. Small hiatal hernia. Lungs/Pleura: No consolidation, pneumothorax or effusion. There are areas of interstitial septal thickening bilaterally with honeycombing and fibrosis. Previously there was more motion on than on today's examination. On the prior there is a 7 mm right upper lobe lung nodule laterally at the apex. Today this nodule measures 5 mm on series 7, image 21. The difference could be related to the motion. Small ground-glass nodules in the right lower lobe such as 5 mm on series 7 image image 81. Adjacent focus measures 4 mm. These also appear cavitary. Further tiny nodules elsewhere. Upper Abdomen: The adrenal glands are incompletely included in the imaging field. Visualized portions are preserved. Liver appears slightly enlarged. Musculoskeletal: Slight curvature of the spine. Scattered degenerative changes. IMPRESSION: Right upper lobe nodule appears slightly smaller today but the differences in appearance could relate to level of motion on the prior examination. There are some new ground-glass subtle cavitary nodules in the right lower lobe measuring up to 5 mm as well. With these changes would recommend follow-up imaging in 6 months. Underlying interstitial septal thickening with some peripheral fibrosis. New small pericardial effusion. Small hiatal hernia. Significant coronary artery calcifications. Please  correlate for other coronary risk factors. Aortic Atherosclerosis (ICD10-I70.0) and Emphysema (ICD10-J43.9). Electronically Signed   By: Karen Kays M.D.   On: 01/08/2024 10:50      Signature  -   Huey Bienenstock M.D on 01/08/2024 at 12:48 PM   -  To page go to [email or web address]

## 2024-01-08 NOTE — Plan of Care (Signed)
   Problem: Education: Goal: Knowledge of General Education information will improve Description: Including pain rating scale, medication(s)/side effects and non-pharmacologic comfort measures Outcome: Progressing   Problem: Health Behavior/Discharge Planning: Goal: Ability to manage health-related needs will improve Outcome: Progressing   Problem: Nutrition: Goal: Adequate nutrition will be maintained Outcome: Progressing

## 2024-01-09 DIAGNOSIS — N390 Urinary tract infection, site not specified: Secondary | ICD-10-CM | POA: Diagnosis not present

## 2024-01-09 MED ORDER — HYDROCODONE-ACETAMINOPHEN 5-325 MG PO TABS
1.0000 | ORAL_TABLET | Freq: Four times a day (QID) | ORAL | Status: DC | PRN
  Administered 2024-01-09 – 2024-01-12 (×8): 1 via ORAL
  Filled 2024-01-09 (×8): qty 1

## 2024-01-09 MED ORDER — HYDROMORPHONE HCL 1 MG/ML IJ SOLN
1.0000 mg | Freq: Once | INTRAMUSCULAR | Status: AC
  Administered 2024-01-09: 1 mg via INTRAVENOUS
  Filled 2024-01-09: qty 1

## 2024-01-09 MED ORDER — TRAMADOL HCL 50 MG PO TABS
50.0000 mg | ORAL_TABLET | Freq: Two times a day (BID) | ORAL | Status: DC | PRN
  Administered 2024-01-09 – 2024-01-12 (×3): 50 mg via ORAL
  Filled 2024-01-09 (×3): qty 1

## 2024-01-09 NOTE — TOC Progression Note (Signed)
 Transition of Care [facility]) - Initial/Assessment Note    Patient Details  Name: Sherry Baker MRN: [id number] Date of Birth: 01-Sep-1966  [facility] of [facility]) CM/SW Contact:    Ralene Bathe, LCSW Phone Number: 01/09/2024, 9:46 AM  Clinical Narrative:                 CSW contacted [place] with [facility] and LM inquiring as to whether insurance Berkley Harvey has been approved.    PASRR is still pending.  TOC will continue to follow.    Expected Discharge Plan: Skilled Nursing Facility Barriers to Discharge: Continued Medical Work up, English as a second language teacher, SNF Pending bed offer   Patient Goals and CMS Choice   CMS Medicare.gov Compare Post Acute Care list provided to:: Patient Represenative (must comment) Choice offered to / list presented to : Patient, Adult Children      Expected Discharge Plan and Services     Post Acute Care Choice: Skilled Nursing Facility Living arrangements for the past 2 months: Single Family Home                                      Prior Living Arrangements/Services Living arrangements for the past 2 months: Single Family Home Lives with:: Self   Do you feel safe going back to the place where you live?: Yes      Need for Family Participation in Patient Care: Yes (Comment) Care giver support system in place?: Yes (comment)   Criminal Activity/Legal Involvement Pertinent to Current Situation/Hospitalization: No - Comment as needed  Activities of Daily Living   ADL Screening (condition at time of admission) Independently performs ADLs?: Yes (appropriate for developmental age) Is the patient deaf or have difficulty hearing?: No Does the patient have difficulty seeing, even when wearing glasses/contacts?: Yes Does the patient have difficulty concentrating, remembering, or making decisions?: Yes  Permission Sought/Granted Permission sought to share information with : Case Manager    Share Information with NAME: Crystl Warehouse manager granted to share info w Relationship: Daughter     Emotional Assessment       Orientation: : Oriented to Self, Oriented to Place, Oriented to  Time, Oriented to Situation   Psych Involvement: No (comment)  Admission diagnosis:  ARF (acute renal failure) (HCC) [N17.9] AKI (acute kidney injury) (HCC) [N17.9] Acute kidney injury (HCC) [N17.9] Urinary tract infection without hematuria, site unspecified [N39.0] 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   History of colon cancer    Normocytic anemia    Metabolic acidemia    Acquired hypothyroidism    HTN (hypertension)    Right hip pain causing ambulatory dysfunction 11/26/2021   CKD (chronic kidney disease), stage III (HCC) 11/25/2021   Physical deconditioning    Ambulatory dysfunction    Protein-calorie malnutrition, severe 06/25/2021   Homelessness 06/23/2021   Alcoholic cirrhosis of liver without ascites (HCC) 06/23/2021   Alcohol abuse 06/14/2019   Pressure injury of skin 06/06/2019  Genetic testing 03/24/2017   GERD (gastroesophageal reflux disease) 02/18/2017   Rectal cancer (HCC) 01/27/2017   Anemia 01/16/2017   Tobacco abuse    Bipolar affective disorder (HCC)    Substance abuse (HCC) 02/11/2014   Depression 02/11/2014   Thrombocytopenia (HCC) 02/11/2014   PCP:  Storm Frisk, MD Pharmacy:   [id number] - [place], [place] - [address] AT [facility] [address] & [address] [facility] [address] Phone:  [phone] Fax: [phone]  [facility] - [facility] [address], [address] [place] [address] Phone: [phone] Fax: [phone]     Social Drivers of Health (SDOH) Social History: SDOH Screenings   Food Insecurity: Patient Unable To Answer (01/03/2024)  Recent Concern: Food Insecurity - Food Insecurity Present (10/12/2023)  Housing: Patient Unable To Answer (01/03/2024)  Recent Concern: Housing - Medium Risk (10/12/2023)  Transportation Needs: Patient Unable To Answer (01/03/2024)  Recent Concern: Transportation Needs - Unmet Transportation Needs (10/12/2023)  Utilities: Patient Unable To Answer (01/03/2024)  Depression (PHQ2-9): High Risk (12/16/2023)  Financial Resource Strain: Low Risk  (10/12/2023)  Physical Activity: Inactive (10/12/2023)  Social Connections: Socially Isolated (10/12/2023)  Stress: Stress Concern Present (10/12/2023)  Tobacco Use: High Risk (01/06/2024)  Health Literacy: Adequate Health Literacy (10/12/2023)   SDOH Interventions:     Readmission Risk Interventions    06/16/2022    4:54 PM 04/17/2022   11:21 AM 12/10/2021    1:58 PM  Readmission Risk Prevention Plan  Transportation Screening Complete Complete Complete  Medication Review Oceanographer)  Complete Complete  PCP or Specialist appointment within 3-5 days of discharge Not Complete Complete Complete  PCP/Specialist Appt Not Complete comments patient needs to make apt    HRI or Home Care Consult Complete Complete Not Complete  HRI or Home Care Consult Pt Refusal Comments   history of unsafe home environment  SW Recovery Care/Counseling Consult  Complete Complete  Palliative Care Screening  Not Applicable Not Applicable  Skilled Nursing Facility Patient Refused Patient Refused Not Applicable

## 2024-01-09 NOTE — Plan of Care (Signed)

## 2024-01-09 NOTE — Progress Notes (Signed)
 PROGRESS NOTE                                                                                                                                                                                                             Patient Demographics:    Sherry Baker, is a 58 y.o. female, DOB - Nov 25, 1965, WUJ:[id number]  Outpatient Primary MD for the patient is Storm Frisk, MD    LOS - 8  Admit date - 12/31/2023    Chief Complaint  Patient presents with   Abdominal Pain    Hypotensive       Brief Narrative (HPI from H&P)    58 year old woman with PMH of rectal cancer s/p surgery with colostomy, HTN, GERD, depression, COPD, recurrent UTIs who presented with nausea, vomiting.  Patient reported that she had just finished a course of antibiotics.  Presented with AKI, UA concerning for UTI.  Noted that recent urine cultures grew ESBL Klebsiella.  Patient was started on meropenem.   Patient is medically stable for discharge, awaiting her insurance authorization, and passer still pending   Subjective:   No significant events overnight, afebrile, good appetite   Assessment  & Plan :   AKI on CKD - Baseline creatinine is 1.4-1.5. -Renal, due to nausea, vomiting and dehydration. - no acute findings on renal ultrasound   ESBL Klebsiella UTI Urine cultures growing ESBL Klebsiella -Treated 7 days on IV meropenem. -failed outpatient nitrofurantoin treatment.   Abdominal pain. Vesiculo cutaneous fistula -Imaging on admission admission significant for cystitis, she had suprapubic tenderness, she is with known history of vesicular cutaneous fistula, -Bladder scan has been obtained with low volume, In-N-Out done this morning with 100 cc of clear urine output noted. -Urology input greatly appreciated, no further workup currently, to [facility]up with [facility] as [facility].   History of rectal cancer s/p colostomy -Continue colostomy  care.   Nighttime sinus bradycardia with a 2-second pause on 01/02/2024.   -Resolved after holding her high-dose Coreg at home - stable TSH, bradycardia has resolved.  HTN   - continue Norvasc, added hydralazine, stop Coreg due to above   COPD -Continue home Anoro Ellipta.   Depression.   -No acute issues supportive care.  Pulmonary nodules and fibrosis -she is following with pulmonary as an outpatient, was due for repeat CT chest, obtained during hospital  stay, right upper lobe nodules appear stable, new 5 mm right lower lobe, recommendation for 49-month follow-up. -Stable underlining interstitial septal thickening with some peripheral fibrosis -Patient was instructed to keep her outpatient pulmonary follow-up.   Chronic sacral decubitus ulcer present on admission kindly see nursing notes.  Supportive care no acute issues.  Wound care team following.      Condition - Extremely Guarded  Family Communication  : None at bedside  Code Status :   Full  Consults  : Urology  PUD Prophylaxis : PPI   Procedures  :     Renal US - 1. Slightly atrophic right kidney with cortical scarring but no hydronephrosis. Mildly echogenic right renal cortex consistent with medical renal disease. 2. Normal left kidney  CT - 1. No acute localizing process in the abdomen or pelvis. 2. Stable left-sided colostomy. No bowel obstruction. 3. Stable sacrococcygeal decubitus ulcer with erosive changes of the tip of the coccyx. 4. Stable diffuse bladder wall thickening. Correlate clinically for cystitis. 5. Stable chronic appearing reticular opacities in both lung bases. 6. 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 7. Small hiatal hernia. 8. Aortic atherosclerosis. Aortic Atherosclerosis (ICD10-I70.0).      Disposition Plan  :    Status is: Inpatient   DVT Prophylaxis  :    enoxaparin (LOVENOX) injection 30 mg Start: 01/01/24 1400    Lab Results  Component Value Date   PLT 199  01/07/2024    Diet :  Diet Order             Diet Heart Room service appropriate? Yes; Fluid consistency: Thin  Diet effective now                    Inpatient Medications  Scheduled Meds:  amLODipine  10 mg Oral Daily   enoxaparin (LOVENOX) injection  30 mg Subcutaneous Q24H   feeding supplement  237 mL Oral TID BM   hydrALAZINE  100 mg Oral Q8H   pantoprazole  40 mg Oral Daily   QUEtiapine  50 mg Oral BID   sertraline  50 mg Oral Daily   umeclidinium-vilanterol  1 puff Inhalation Daily   Continuous Infusions:   PRN Meds:.acetaminophen, albuterol, diphenhydrAMINE, HYDROcodone-acetaminophen, ondansetron (ZOFRAN) IV    Objective:   Vitals:   01/08/24 0954 01/08/24 2124 01/09/24 0524 01/09/24 0753  BP: 118/75 132/79 123/82 137/77  Pulse:   75 81  Resp:   (!) 24 19  Temp: 98.2 F (36.8 C) 98.9 F (37.2 C) (!) 97.5 F (36.4 C) 97.6 F (36.4 C)  TempSrc:  Oral Oral Oral  SpO2: 99% 95% 93% 99%  Weight:      Height:        Wt Readings from Last 3 Encounters:  01/07/24 51.4 kg  12/16/23 49.2 kg  11/16/23 47.6 kg     Intake/Output Summary (Last 24 hours) at 01/09/2024 1211 Last data filed at 01/09/2024 0500 Gross per 24 hour  Intake 240 ml  Output 1650 ml  Net -1410 ml     Physical Exam  Awake Alert, Oriented X 3, No new F.N deficits, Normal affect Symmetrical Chest wall movement, mild rales at the bases RRR,No Gallops,Rubs or new Murmurs, No Parasternal Heave +ve B.Sounds, Abd Soft, No tenderness, colostomy present No Cyanosis, Clubbing or edema, No new Rash or bruise       RN pressure injury documentation: Pressure Injury 02/03/22 Sacrum Stage 4 - Full thickness tissue loss with exposed bone, tendon or  muscle. (Active)  02/03/22   Location: Sacrum  Location Orientation:   Staging: Stage 4 - Full thickness tissue loss with exposed bone, tendon or muscle.  Wound Description (Comments):   Present on Admission: Yes     Pressure Injury 03/06/22  Sacrum Stage 4 - Full thickness tissue loss with exposed bone, tendon or muscle. (Active)  03/06/22 1900  Location: Sacrum  Location Orientation:   Staging: Stage 4 - Full thickness tissue loss with exposed bone, tendon or muscle.  Wound Description (Comments):   Present on Admission: Yes     Pressure Injury 04/09/22 Buttocks Right Stage 2 -  Partial thickness loss of dermis presenting as a shallow open injury with a red, pink wound bed without slough. pink, red (Active)  04/09/22 0415  Location: Buttocks  Location Orientation: Right  Staging: Stage 2 -  Partial thickness loss of dermis presenting as a shallow open injury with a red, pink wound bed without slough.  Wound Description (Comments): pink, red  Present on Admission: Yes     Pressure Injury 04/09/22 Sacrum Upper Stage 3 -  Full thickness tissue loss. Subcutaneous fat may be visible but bone, tendon or muscle are NOT exposed. (Active)  04/09/22 0415  Location: Sacrum  Location Orientation: Upper  Staging: Stage 3 -  Full thickness tissue loss. Subcutaneous fat may be visible but bone, tendon or muscle are NOT exposed.  Wound Description (Comments):   Present on Admission: No     Pressure Injury 04/13/22 Buttocks Right Deep Tissue Pressure Injury - Purple or maroon localized area of discolored intact skin or blood-filled blister due to damage of underlying soft tissue from pressure and/or shear. (Active)  04/13/22 2042  Location: Buttocks  Location Orientation: Right  Staging: Deep Tissue Pressure Injury - Purple or maroon localized area of discolored intact skin or blood-filled blister due to damage of underlying soft tissue from pressure and/or shear.  Wound Description (Comments):   Present on Admission: No     Pressure Injury 04/13/22 Toe (Comment  which one) Anterior;Right Deep Tissue Pressure Injury - Purple or maroon localized area of discolored intact skin or blood-filled blister due to damage of underlying soft tissue  from pressure and/or shear. (Active)  04/13/22 2042  Location: Toe (Comment  which one)  Location Orientation: Anterior;Right  Staging: Deep Tissue Pressure Injury - Purple or maroon localized area of discolored intact skin or blood-filled blister due to damage of underlying soft tissue from pressure and/or shear.  Wound Description (Comments):   Present on Admission:      Pressure Injury 06/11/22 Buttocks Left Stage 2 -  Partial thickness loss of dermis presenting as a shallow open injury with a red, pink wound bed without slough. (Active)  06/11/22 1257  Location: Buttocks  Location Orientation: Left  Staging: Stage 2 -  Partial thickness loss of dermis presenting as a shallow open injury with a red, pink wound bed without slough.  Wound Description (Comments):   Present on Admission: No     Pressure Injury 06/11/22 Buttocks Right Stage 2 -  Partial thickness loss of dermis presenting as a shallow open injury with a red, pink wound bed without slough. (Active)  06/11/22 1258  Location: Buttocks  Location Orientation: Right  Staging: Stage 2 -  Partial thickness loss of dermis presenting as a shallow open injury with a red, pink wound bed without slough.  Wound Description (Comments):   Present on Admission:      Pressure Injury 10/26/22 Coccyx Mid  Stage 4 - Full thickness tissue loss with exposed bone, tendon or muscle. (Active)  10/26/22 1430  Location: Coccyx  Location Orientation: Mid  Staging: Stage 4 - Full thickness tissue loss with exposed bone, tendon or muscle.  Wound Description (Comments):   Present on Admission: Yes     Pressure Injury 10/26/22 Knee Anterior;Left Deep Tissue Pressure Injury - Purple or maroon localized area of discolored intact skin or blood-filled blister due to damage of underlying soft tissue from pressure and/or shear. (Active)  10/26/22 1330  Location: Knee  Location Orientation: Anterior;Left  Staging: Deep Tissue Pressure Injury - Purple or  maroon localized area of discolored intact skin or blood-filled blister due to damage of underlying soft tissue from pressure and/or shear.  Wound Description (Comments):   Present on Admission: Yes     Pressure Injury 10/28/22 Elbow Posterior;Right Stage 1 -  Intact skin with non-blanchable redness of a localized area usually over a bony prominence. (Active)  10/28/22 1235  Location: Elbow  Location Orientation: Posterior;Right  Staging: Stage 1 -  Intact skin with non-blanchable redness of a localized area usually over a bony prominence.  Wound Description (Comments):   Present on Admission: Yes     Pressure Injury 10/28/22 Elbow Left;Posterior Stage 1 -  Intact skin with non-blanchable redness of a localized area usually over a bony prominence. (Active)  10/28/22 1235  Location: Elbow  Location Orientation: Left;Posterior  Staging: Stage 1 -  Intact skin with non-blanchable redness of a localized area usually over a bony prominence.  Wound Description (Comments):   Present on Admission:      Pressure Injury 01/01/24 Coccyx Mid Deep Open Red Wound (Active)  01/01/24 1800  Location: Coccyx  Location Orientation: Mid  Staging:   Wound Description (Comments): Deep Open Red Wound  Present on Admission: Yes  Dressing Type Moist to dry 01/09/24 1100      Data Review:    Recent Labs  Lab 01/03/24 0606 01/04/24 0506 01/05/24 0459 01/06/24 0523 01/07/24 0559  WBC 8.7 10.1 10.0 9.6 10.5  HGB 10.6* 10.8* 10.9* 10.6* 10.4*  HCT 33.3* 33.1* 32.9* 31.5* 31.7*  PLT 186 206 199 192 199  MCV 86.5 83.8 83.3 83.6 83.6  MCH 27.5 27.3 27.6 28.1 27.4  MCHC 31.8 32.6 33.1 33.7 32.8  RDW 18.5* 18.1* 18.0* 18.0* 17.9*  LYMPHSABS 0.8 2.1 2.2 1.4 1.9  MONOABS 0.2 0.7 1.0 0.6 0.9  EOSABS 2.8* 2.4* 2.4* 3.0* 2.8*  BASOSABS 0.0 0.0 0.0 0.1 0.1    Recent Labs  Lab 01/03/24 0606 01/04/24 0506 01/05/24 0459 01/06/24 0523 01/07/24 0559  NA 135 134* 133* 134* 134*  K 4.7 5.1 4.8 4.6 4.6   CL 106 101 100 101 103  CO2 20* 24 25 20* 21*  ANIONGAP 9 9 8 13 10   GLUCOSE 110* 97 93 114* 90  BUN 33* 37* 41* 49* 56*  CREATININE 1.75* 1.81* 1.82* 1.83* 2.00*  CRP 3.1* 1.8* 1.4* 1.2*  --   PROCALCITON 0.90 0.54 0.34 0.18 0.11  TSH 2.910  --   --   --   --   BNP 308.7* 159.3* 148.5* 114.0*  --   MG 2.0 1.8 1.9 2.0  --   CALCIUM 9.0 8.7* 8.4* 8.5* 8.5*      Recent Labs  Lab 01/03/24 0606 01/04/24 0506 01/05/24 0459 01/06/24 0523 01/07/24 0559  CRP 3.1* 1.8* 1.4* 1.2*  --   PROCALCITON 0.90 0.54 0.34 0.18 0.11  TSH 2.910  --   --   --   --  BNP 308.7* 159.3* 148.5* 114.0*  --   MG 2.0 1.8 1.9 2.0  --   CALCIUM 9.0 8.7* 8.4* 8.5* 8.5*    --------------------------------------------------------------------------------------------------------------- Lab Results  Component Value Date   TRIG 176 (H) 11/03/2022    Lab Results  Component Value Date   HGBA1C 5.0 02/06/2022   No results for input(s): "TSH", "T4TOTAL", "FREET4", "T3FREE", "THYROIDAB" in the last 72 hours.  No results for input(s): "VITAMINB12", "FOLATE", "FERRITIN", "TIBC", "IRON", "RETICCTPCT" in the last 72 hours. ------------------------------------------------------------------------------------------------------------------ Cardiac Enzymes No results for input(s): "CKMB", "TROPONINI", "MYOGLOBIN" in the last 168 hours.  Invalid input(s): "CK"  Micro Results Recent Results (from the past 240 hours)  Urine Culture     Status: Abnormal   Collection Time: 01/01/24 12:36 AM   Specimen: Urine, Clean Catch  Result Value Ref Range Status   Specimen Description URINE, CLEAN CATCH  Final   Special Requests   Final    NONE Performed at [facility], [address], [place], [place] [address]    Culture (A)  Final    >=100,000 COLONIES/mL KLEBSIELLA PNEUMONIAE Confirmed Extended Spectrum Beta-Lactamase Producer (ESBL).  In bloodstream infections from ESBL organisms, carbapenems are preferred  over piperacillin/tazobactam. They are shown to have a lower risk of mortality.    Report Status 01/04/2024 FINAL  Final   Organism ID, Bacteria KLEBSIELLA PNEUMONIAE (A)  Final      Susceptibility   Klebsiella pneumoniae - MIC*    AMPICILLIN >=32 RESISTANT Resistant     CEFAZOLIN >=64 RESISTANT Resistant     CEFEPIME >=32 RESISTANT Resistant     CEFTRIAXONE >=64 RESISTANT Resistant     CIPROFLOXACIN >=4 RESISTANT Resistant     GENTAMICIN >=16 RESISTANT Resistant     IMIPENEM <=0.25 SENSITIVE Sensitive     NITROFURANTOIN 128 RESISTANT Resistant     TRIMETH/SULFA >=320 RESISTANT Resistant     AMPICILLIN/SULBACTAM >=32 RESISTANT Resistant     PIP/TAZO 64 INTERMEDIATE Intermediate ug/mL    * >=100,000 COLONIES/mL KLEBSIELLA PNEUMONIAE  MRSA Next Gen by PCR, Nasal     Status: None   Collection Time: 01/07/24  8:34 PM   Specimen: Nasal Mucosa; Nasal Swab  Result Value Ref Range Status   MRSA by PCR Next Gen NOT DETECTED NOT DETECTED Final    Comment: (NOTE) The GeneXpert MRSA Assay (FDA approved for NASAL specimens only), is one component of a comprehensive MRSA colonization surveillance program. It is not intended to diagnose MRSA infection nor to guide or monitor treatment for MRSA infections. Test performance is not FDA approved in patients less than 66 years old. Performed at [facility], [address] [place], [place] [address]     Radiology Report CT CHEST WO CONTRAST Result Date: 01/08/2024 CLINICAL DATA:  Lung nodules. EXAM: CT CHEST WITHOUT CONTRAST TECHNIQUE: Multidetector CT imaging of the chest was performed following the standard protocol without IV contrast. RADIATION DOSE REDUCTION: This exam was performed according to the departmental dose-optimization program which includes automated exposure control, adjustment of the mA and/or kV according to patient size and/or use of iterative reconstruction technique. COMPARISON:  CT 03/06/2022. FINDINGS: Cardiovascular:  Small pericardial effusion, new from prior CT scan. Coronary calcifications are seen. Heart is nonenlarged. The thoracic aorta is normal course and caliber with scattered vascular calcifications. Mediastinum/Nodes: No specific abnormal lymph node enlargement identified in the axillary regions, hilum or mediastinum. A few small less than 1 cm size mediastinal nodes are seen, not pathologic by size criteria and  unchanged. Preserved thyroid gland. Small hiatal hernia. Lungs/Pleura: No consolidation, pneumothorax or effusion. There are areas of interstitial septal thickening bilaterally with honeycombing and fibrosis. Previously there was more motion on than on today's examination. On the prior there is a 7 mm right upper lobe lung nodule laterally at the apex. Today this nodule measures 5 mm on series 7, image 21. The difference could be related to the motion. Small ground-glass nodules in the right lower lobe such as 5 mm on series 7 image image 81. Adjacent focus measures 4 mm. These also appear cavitary. Further tiny nodules elsewhere. Upper Abdomen: The adrenal glands are incompletely included in the imaging field. Visualized portions are preserved. Liver appears slightly enlarged. Musculoskeletal: Slight curvature of the spine. Scattered degenerative changes. IMPRESSION: Right upper lobe nodule appears slightly smaller today but the differences in appearance could relate to level of motion on the prior examination. There are some new ground-glass subtle cavitary nodules in the right lower lobe measuring up to 5 mm as well. With these changes would recommend follow-up imaging in 6 months. Underlying interstitial septal thickening with some peripheral fibrosis. New small pericardial effusion. Small hiatal hernia. Significant coronary artery calcifications. Please correlate for other coronary risk factors. Aortic Atherosclerosis (ICD10-I70.0) and Emphysema (ICD10-J43.9). Electronically Signed   By: Karen Kays M.D.    On: 01/08/2024 10:50      Signature  -   Huey Bienenstock M.D on 01/09/2024 at 12:11 PM   -  To page go to [email or web address]

## 2024-01-09 NOTE — Progress Notes (Signed)
   01/09/24 1530  Mobility  Activity Ambulated with assistance in hallway  Level of Assistance Contact guard assist, steadying assist  Assistive Device Front wheel walker  Distance Ambulated (ft) 150 ft  Activity Response Tolerated fair  Mobility Referral Yes  Mobility visit 1 Mobility  Mobility Specialist Start Time (ACUTE ONLY) 1515  Mobility Specialist Stop Time (ACUTE ONLY) 1530  Mobility Specialist Time Calculation (min) (ACUTE ONLY) 15 min   Mobility Specialist: Progress Note  Pre-Mobility:      HR 90, SpO2 98% RA During Mobility: HR 130s Post-Mobility:    HR 103, SpO2 98% RA  Pt agreeable to mobility session - received in bed. C/o abd pain rated 8/10 and BLE weakness. Returned to bed with all needs met - call bell within reach.   Barnie Mort, BS Mobility Specialist Please contact via SecureChat or  Rehab office at [phone].

## 2024-01-09 NOTE — Plan of Care (Signed)
   Problem: Education: Goal: Knowledge of General Education information will improve Description Including pain rating scale, medication(s)/side effects and non-pharmacologic comfort measures Outcome: Progressing

## 2024-01-10 DIAGNOSIS — N179 Acute kidney failure, unspecified: Secondary | ICD-10-CM | POA: Diagnosis not present

## 2024-01-10 LAB — BASIC METABOLIC PANEL
Anion gap: 11 (ref 5–15)
BUN: 52 mg/dL — ABNORMAL HIGH (ref 6–20)
CO2: 20 mmol/L — ABNORMAL LOW (ref 22–32)
Calcium: 8.9 mg/dL (ref 8.9–10.3)
Chloride: 102 mmol/L (ref 98–111)
Creatinine, Ser: 1.58 mg/dL — ABNORMAL HIGH (ref 0.44–1.00)
GFR, Estimated: 38 mL/min — ABNORMAL LOW (ref >60)
Glucose, Bld: 88 mg/dL (ref 70–99)
Potassium: 4.4 mmol/L (ref 3.5–5.1)
Sodium: 133 mmol/L — ABNORMAL LOW (ref 135–145)

## 2024-01-10 MED ORDER — SODIUM CHLORIDE 0.9 % IV SOLN: INTRAVENOUS | Status: AC

## 2024-01-10 NOTE — Progress Notes (Signed)
 PROGRESS NOTE                                                                                                                                                                                                             Patient Demographics:    Sherry Baker, is a 58 y.o. female, DOB - 10-26-1966, GNF:[id number]  Outpatient Primary MD for the patient is Storm Frisk, MD    LOS - 9  Admit date - 12/31/2023    Chief Complaint  Patient presents with   Abdominal Pain    Hypotensive       Brief Narrative (HPI from H&P)    58 year old woman with PMH of rectal cancer s/p surgery with colostomy, HTN, GERD, depression, COPD, recurrent UTIs who presented with nausea, vomiting.  Patient reported that she had just finished a course of antibiotics.  Presented with AKI, UA concerning for UTI.  Noted that recent urine cultures grew ESBL Klebsiella.  Patient was started on meropenem.   Patient is medically stable for discharge, awaiting her insurance authorization, and passer still pending   Subjective:   No significant events overnight, afebrile, good appetite, she is complaining of lower back pain.   Assessment  & Plan :   AKI on CKD - Baseline creatinine is 1.4-1.5. -Renal, due to nausea, vomiting and dehydration. - no acute findings on renal ultrasound   ESBL Klebsiella UTI Urine cultures growing ESBL Klebsiella -Treated 7 days on IV meropenem. -failed outpatient nitrofurantoin treatment.   Abdominal pain. Vesiculo cutaneous fistula -Imaging on admission admission significant for cystitis, she had suprapubic tenderness, she is with known history of vesicular cutaneous fistula, -Bladder scan has been obtained with low volume, In-N-Out done this morning with 100 cc of clear urine output noted. -Urology input greatly appreciated, no further workup currently, to [facility]up with [facility] as [facility].   History of rectal  cancer s/p colostomy -Continue colostomy care.   Nighttime sinus bradycardia with a 2-second pause on 01/02/2024.   -Resolved after holding her high-dose Coreg at home - stable TSH, bradycardia has resolved.  HTN   - continue Norvasc, added hydralazine, stop Coreg due to above   COPD -Continue home Anoro Ellipta.   Depression.   -No acute issues supportive care.  Pulmonary nodules and fibrosis -she is following with pulmonary as an outpatient, was due  for repeat CT chest, obtained during hospital stay, right upper lobe nodules appear stable, new 5 mm right lower lobe, recommendation for 63-month follow-up. -Stable underlining interstitial septal thickening with some peripheral fibrosis -Patient was instructed to keep her outpatient pulmonary follow-up.   Chronic sacral decubitus ulcer present on admission kindly see nursing notes.  Supportive care no acute issues.  Wound care team following.      Condition - Extremely Guarded  Family Communication  : None at bedside today, discussed with her cousin 3/8  Code Status :   Full  Consults  : Urology  PUD Prophylaxis : PPI   Procedures  :     Renal US - 1. Slightly atrophic right kidney with cortical scarring but no hydronephrosis. Mildly echogenic right renal cortex consistent with medical renal disease. 2. Normal left kidney  CT - 1. No acute localizing process in the abdomen or pelvis. 2. Stable left-sided colostomy. No bowel obstruction. 3. Stable sacrococcygeal decubitus ulcer with erosive changes of the tip of the coccyx. 4. Stable diffuse bladder wall thickening. Correlate clinically for cystitis. 5. Stable chronic appearing reticular opacities in both lung bases. 6. 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 7. Small hiatal hernia. 8. Aortic atherosclerosis. Aortic Atherosclerosis (ICD10-I70.0).      Disposition Plan  :    Status is: Inpatient   DVT Prophylaxis  :    enoxaparin (LOVENOX) injection 30  mg Start: 01/01/24 1400    Lab Results  Component Value Date   PLT 199 01/07/2024    Diet :  Diet Order             Diet Heart Room service appropriate? Yes; Fluid consistency: Thin  Diet effective now                    Inpatient Medications  Scheduled Meds:  amLODipine  10 mg Oral Daily   enoxaparin (LOVENOX) injection  30 mg Subcutaneous Q24H   feeding supplement  237 mL Oral TID BM   hydrALAZINE  100 mg Oral Q8H   pantoprazole  40 mg Oral Daily   QUEtiapine  50 mg Oral BID   sertraline  50 mg Oral Daily   umeclidinium-vilanterol  1 puff Inhalation Daily   Continuous Infusions:   PRN Meds:.acetaminophen, albuterol, diphenhydrAMINE, HYDROcodone-acetaminophen, ondansetron (ZOFRAN) IV, traMADol    Objective:   Vitals:   01/09/24 0753 01/09/24 1712 01/09/24 1952 01/10/24 0858  BP: 137/77 118/64    Pulse: 81 88  79  Resp: 19 19  15   Temp: 97.6 F (36.4 C) (!) 97.5 F (36.4 C) 97.7 F (36.5 C)   TempSrc: Oral Oral Oral   SpO2: 99% 96%  96%  Weight:      Height:        Wt Readings from Last 3 Encounters:  01/07/24 51.4 kg  12/16/23 49.2 kg  11/16/23 47.6 kg     Intake/Output Summary (Last 24 hours) at 01/10/2024 1441 Last data filed at 01/10/2024 1352 Gross per 24 hour  Intake --  Output 1450 ml  Net -1450 ml     Physical Exam  Awake Alert, Oriented X 3, No new F.N deficits, Normal affect Symmetrical Chest wall movement, Good air movement bilaterally, CTAB RRR,No Gallops,Rubs or new Murmurs, No Parasternal Heave +ve B.Sounds, Abd Soft, No tenderness, colostomy present No Cyanosis, Clubbing or edema, No new Rash or bruise        RN pressure injury documentation: Pressure Injury 02/03/22 Sacrum Stage 4 -  Full thickness tissue loss with exposed bone, tendon or muscle. (Active)  02/03/22   Location: Sacrum  Location Orientation:   Staging: Stage 4 - Full thickness tissue loss with exposed bone, tendon or muscle.  Wound Description  (Comments):   Present on Admission: Yes     Pressure Injury 03/06/22 Sacrum Stage 4 - Full thickness tissue loss with exposed bone, tendon or muscle. (Active)  03/06/22 1900  Location: Sacrum  Location Orientation:   Staging: Stage 4 - Full thickness tissue loss with exposed bone, tendon or muscle.  Wound Description (Comments):   Present on Admission: Yes     Pressure Injury 04/09/22 Buttocks Right Stage 2 -  Partial thickness loss of dermis presenting as a shallow open injury with a red, pink wound bed without slough. pink, red (Active)  04/09/22 0415  Location: Buttocks  Location Orientation: Right  Staging: Stage 2 -  Partial thickness loss of dermis presenting as a shallow open injury with a red, pink wound bed without slough.  Wound Description (Comments): pink, red  Present on Admission: Yes     Pressure Injury 04/09/22 Sacrum Upper Stage 3 -  Full thickness tissue loss. Subcutaneous fat may be visible but bone, tendon or muscle are NOT exposed. (Active)  04/09/22 0415  Location: Sacrum  Location Orientation: Upper  Staging: Stage 3 -  Full thickness tissue loss. Subcutaneous fat may be visible but bone, tendon or muscle are NOT exposed.  Wound Description (Comments):   Present on Admission: No     Pressure Injury 04/13/22 Buttocks Right Deep Tissue Pressure Injury - Purple or maroon localized area of discolored intact skin or blood-filled blister due to damage of underlying soft tissue from pressure and/or shear. (Active)  04/13/22 2042  Location: Buttocks  Location Orientation: Right  Staging: Deep Tissue Pressure Injury - Purple or maroon localized area of discolored intact skin or blood-filled blister due to damage of underlying soft tissue from pressure and/or shear.  Wound Description (Comments):   Present on Admission: No     Pressure Injury 04/13/22 Toe (Comment  which one) Anterior;Right Deep Tissue Pressure Injury - Purple or maroon localized area of discolored  intact skin or blood-filled blister due to damage of underlying soft tissue from pressure and/or shear. (Active)  04/13/22 2042  Location: Toe (Comment  which one)  Location Orientation: Anterior;Right  Staging: Deep Tissue Pressure Injury - Purple or maroon localized area of discolored intact skin or blood-filled blister due to damage of underlying soft tissue from pressure and/or shear.  Wound Description (Comments):   Present on Admission:      Pressure Injury 06/11/22 Buttocks Left Stage 2 -  Partial thickness loss of dermis presenting as a shallow open injury with a red, pink wound bed without slough. (Active)  06/11/22 1257  Location: Buttocks  Location Orientation: Left  Staging: Stage 2 -  Partial thickness loss of dermis presenting as a shallow open injury with a red, pink wound bed without slough.  Wound Description (Comments):   Present on Admission: No     Pressure Injury 06/11/22 Buttocks Right Stage 2 -  Partial thickness loss of dermis presenting as a shallow open injury with a red, pink wound bed without slough. (Active)  06/11/22 1258  Location: Buttocks  Location Orientation: Right  Staging: Stage 2 -  Partial thickness loss of dermis presenting as a shallow open injury with a red, pink wound bed without slough.  Wound Description (Comments):   Present on Admission:  Pressure Injury 10/26/22 Coccyx Mid Stage 4 - Full thickness tissue loss with exposed bone, tendon or muscle. (Active)  10/26/22 1430  Location: Coccyx  Location Orientation: Mid  Staging: Stage 4 - Full thickness tissue loss with exposed bone, tendon or muscle.  Wound Description (Comments):   Present on Admission: Yes     Pressure Injury 10/26/22 Knee Anterior;Left Deep Tissue Pressure Injury - Purple or maroon localized area of discolored intact skin or blood-filled blister due to damage of underlying soft tissue from pressure and/or shear. (Active)  10/26/22 1330  Location: Knee  Location  Orientation: Anterior;Left  Staging: Deep Tissue Pressure Injury - Purple or maroon localized area of discolored intact skin or blood-filled blister due to damage of underlying soft tissue from pressure and/or shear.  Wound Description (Comments):   Present on Admission: Yes     Pressure Injury 10/28/22 Elbow Posterior;Right Stage 1 -  Intact skin with non-blanchable redness of a localized area usually over a bony prominence. (Active)  10/28/22 1235  Location: Elbow  Location Orientation: Posterior;Right  Staging: Stage 1 -  Intact skin with non-blanchable redness of a localized area usually over a bony prominence.  Wound Description (Comments):   Present on Admission: Yes     Pressure Injury 10/28/22 Elbow Left;Posterior Stage 1 -  Intact skin with non-blanchable redness of a localized area usually over a bony prominence. (Active)  10/28/22 1235  Location: Elbow  Location Orientation: Left;Posterior  Staging: Stage 1 -  Intact skin with non-blanchable redness of a localized area usually over a bony prominence.  Wound Description (Comments):   Present on Admission:      Pressure Injury 01/01/24 Coccyx Mid Deep Open Red Wound (Active)  01/01/24 1800  Location: Coccyx  Location Orientation: Mid  Staging:   Wound Description (Comments): Deep Open Red Wound  Present on Admission: Yes  Dressing Type Other (Comment) (wet to dry) 01/09/24 2137      Data Review:    Recent Labs  Lab 01/04/24 0506 01/05/24 0459 01/06/24 0523 01/07/24 0559  WBC 10.1 10.0 9.6 10.5  HGB 10.8* 10.9* 10.6* 10.4*  HCT 33.1* 32.9* 31.5* 31.7*  PLT 206 199 192 199  MCV 83.8 83.3 83.6 83.6  MCH 27.3 27.6 28.1 27.4  MCHC 32.6 33.1 33.7 32.8  RDW 18.1* 18.0* 18.0* 17.9*  LYMPHSABS 2.1 2.2 1.4 1.9  MONOABS 0.7 1.0 0.6 0.9  EOSABS 2.4* 2.4* 3.0* 2.8*  BASOSABS 0.0 0.0 0.1 0.1    Recent Labs  Lab 01/04/24 0506 01/05/24 0459 01/06/24 0523 01/07/24 0559 01/10/24 1356  NA 134* 133* 134* 134* 133*   K 5.1 4.8 4.6 4.6 4.4  CL 101 100 101 103 102  CO2 24 25 20* 21* 20*  ANIONGAP 9 8 13 10 11   GLUCOSE 97 93 114* 90 88  BUN 37* 41* 49* 56* 52*  CREATININE 1.81* 1.82* 1.83* 2.00* 1.58*  CRP 1.8* 1.4* 1.2*  --   --   PROCALCITON 0.54 0.34 0.18 0.11  --   BNP 159.3* 148.5* 114.0*  --   --   MG 1.8 1.9 2.0  --   --   CALCIUM 8.7* 8.4* 8.5* 8.5* 8.9      Recent Labs  Lab 01/04/24 0506 01/05/24 0459 01/06/24 0523 01/07/24 0559 01/10/24 1356  CRP 1.8* 1.4* 1.2*  --   --   PROCALCITON 0.54 0.34 0.18 0.11  --   BNP 159.3* 148.5* 114.0*  --   --   MG 1.8 1.9  2.0  --   --   CALCIUM 8.7* 8.4* 8.5* 8.5* 8.9    --------------------------------------------------------------------------------------------------------------- Lab Results  Component Value Date   TRIG 176 (H) 11/03/2022    Lab Results  Component Value Date   HGBA1C 5.0 02/06/2022   No results for input(s): "TSH", "T4TOTAL", "FREET4", "T3FREE", "THYROIDAB" in the last 72 hours.  No results for input(s): "VITAMINB12", "FOLATE", "FERRITIN", "TIBC", "IRON", "RETICCTPCT" in the last 72 hours. ------------------------------------------------------------------------------------------------------------------ Cardiac Enzymes No results for input(s): "CKMB", "TROPONINI", "MYOGLOBIN" in the last 168 hours.  Invalid input(s): "CK"  Micro Results Recent Results (from the past 240 hours)  Urine Culture     Status: Abnormal   Collection Time: 01/01/24 12:36 AM   Specimen: Urine, Clean Catch  Result Value Ref Range Status   Specimen Description URINE, CLEAN CATCH  Final   Special Requests   Final    NONE Performed at [facility], [address] [place], [place] [address]    Culture (A)  Final    >=100,000 COLONIES/mL KLEBSIELLA PNEUMONIAE Confirmed Extended Spectrum Beta-Lactamase Producer (ESBL).  In bloodstream infections from ESBL organisms, carbapenems are preferred over piperacillin/tazobactam. They are shown  to have a lower risk of mortality.    Report Status 01/04/2024 FINAL  Final   Organism ID, Bacteria KLEBSIELLA PNEUMONIAE (A)  Final      Susceptibility   Klebsiella pneumoniae - MIC*    AMPICILLIN >=32 RESISTANT Resistant     CEFAZOLIN >=64 RESISTANT Resistant     CEFEPIME >=32 RESISTANT Resistant     CEFTRIAXONE >=64 RESISTANT Resistant     CIPROFLOXACIN >=4 RESISTANT Resistant     GENTAMICIN >=16 RESISTANT Resistant     IMIPENEM <=0.25 SENSITIVE Sensitive     NITROFURANTOIN 128 RESISTANT Resistant     TRIMETH/SULFA >=320 RESISTANT Resistant     AMPICILLIN/SULBACTAM >=32 RESISTANT Resistant     PIP/TAZO 64 INTERMEDIATE Intermediate ug/mL    * >=100,000 COLONIES/mL KLEBSIELLA PNEUMONIAE  MRSA Next Gen by PCR, Nasal     Status: None   Collection Time: 01/07/24  8:34 PM   Specimen: Nasal Mucosa; Nasal Swab  Result Value Ref Range Status   MRSA by PCR Next Gen NOT DETECTED NOT DETECTED Final    Comment: (NOTE) The GeneXpert MRSA Assay (FDA approved for NASAL specimens only), is one component of a comprehensive MRSA colonization surveillance program. It is not intended to diagnose MRSA infection nor to guide or monitor treatment for MRSA infections. Test performance is not FDA approved in patients less than 70 years old. Performed at [facility], [address] [place], [place] [address]     Radiology Report No results found.     Signature  -   Huey Bienenstock M.D on 01/10/2024 at 2:41 PM   -  To page go to [email or web address]

## 2024-01-10 NOTE — Plan of Care (Signed)

## 2024-01-11 DIAGNOSIS — N179 Acute kidney failure, unspecified: Secondary | ICD-10-CM | POA: Diagnosis not present

## 2024-01-11 NOTE — Progress Notes (Signed)
 Physical Therapy Treatment  Patient Details Name: Sherry Baker MRN: [id number] DOB: 1965/11/24 Today's Date: 01/11/2024   History of Present Illness 58 year old woman who presented 2/27 with nausea, vomiting. Dx: AKI, UA concerning for UTI.  Noted that recent urine cultures grew ESBL Klebsiella. PMH of rectal cancer s/p surgery with colostomy, HTN, GERD, depression, COPD, recurrent UTIs.    PT Comments  Pt progressing well towards physical therapy goals. She was able to demonstrate transfers and ambulation with gross CGA to supervision for safety with RW for support for OOB mobility. Pt taking intermittent standing rest breaks throughout gait training due to HR increasing and educated pt on activity pacing and pursed-lip breathing. While utilizing techniques, max HR noted 133 bpm and pt without complaints of SOB. Will continue to follow.     If plan is discharge home, recommend the following: A little help with walking and/or transfers;A little help with bathing/dressing/bathroom;Assistance with cooking/housework;Assist for transportation   Can travel by private vehicle     Yes  Equipment Recommendations  Rolling walker (2 wheels)    Recommendations for Other Services OT consult     Precautions / Restrictions Precautions Precautions: Fall Recall of Precautions/Restrictions: Intact Restrictions Weight Bearing Restrictions Per Provider Order: No     Mobility  Bed Mobility Overal bed mobility: Modified Independent Bed Mobility: Supine to Sit, Sit to Supine           General bed mobility comments: Pt transitioned to/from EOB without assistance. Min use of rails but feel pt could have performed without rails if needed.    Transfers Overall transfer level: Needs assistance Equipment used: Rolling walker (2 wheels) Transfers: Sit to/from Stand Sit to Stand: Supervision           General transfer comment: Light supervision for safety. Pt demonstrated proper hand placement  on seated surface for safety. No assist required.    Ambulation/Gait Ambulation/Gait assistance: Supervision, Contact guard assist Gait Distance (Feet): 100 Feet Assistive device: Rolling walker (2 wheels) Gait Pattern/deviations: Decreased stride length, Shuffle, Antalgic, Drifts right/left, Decreased step length - right, Decreased step length - left, Decreased dorsiflexion - right, Trunk flexed Gait velocity: decreased Gait velocity interpretation: 1.31 - 2.62 ft/sec, indicative of limited community ambulator   General Gait Details: Pt ambulated several bouts of 100' while taking intermittent standing rest breaks for pursed-lip breathing to control heart rate. Noted HR max of 133 bpm noted with rest breaks and pursed-lip breathing.   Stairs             Wheelchair Mobility     Tilt Bed    Modified Rankin (Stroke Patients Only)       Balance Overall balance assessment: Needs assistance Sitting-balance support: No upper extremity supported, Feet supported Sitting balance-Leahy Scale: Good     Standing balance support: During functional activity, Bilateral upper extremity supported, Reliant on assistive device for balance Standing balance-Leahy Scale: Fair                              Hotel manager: No apparent difficulties  Cognition Arousal: Alert Behavior During Therapy: WFL for tasks assessed/performed   PT - Cognitive impairments: No apparent impairments                         Following commands: Intact      Cueing Cueing Techniques: Verbal cues, Gestural cues  Exercises  General Comments        Pertinent Vitals/Pain Pain Assessment Pain Assessment: Faces Faces Pain Scale: Hurts a little bit Pain Location: buttocks Pain Descriptors / Indicators: Sore, Guarding Pain Intervention(s): Limited activity within patient's tolerance, Monitored during session, Repositioned    Home Living                           Prior Function            PT Goals (current goals can now be found in the care plan section) Acute Rehab PT Goals Patient Stated Goal: Get well PT Goal Formulation: With patient Time For Goal Achievement: 01/16/24 Potential to Achieve Goals: Good Progress towards PT goals: Progressing toward goals    Frequency    Min 3X/week      PT Plan      Co-evaluation              AM-PAC PT "6 Clicks" Mobility   Outcome Measure  Help needed turning from your back to your side while in a flat bed without using bedrails?: None Help needed moving from lying on your back to sitting on the side of a flat bed without using bedrails?: None Help needed moving to and from a bed to a chair (including a wheelchair)?: A Little Help needed standing up from a chair using your arms (e.g., wheelchair or bedside chair)?: A Little Help needed to walk in hospital room?: A Little Help needed climbing 3-5 steps with a railing? : A Little 6 Click Score: 20    End of Session Equipment Utilized During Treatment: Gait belt Activity Tolerance: Patient tolerated treatment well Patient left: in chair;with call bell/phone within reach Nurse Communication: Mobility status PT Visit Diagnosis: Unsteadiness on feet (R26.81);Other abnormalities of gait and mobility (R26.89);Muscle weakness (generalized) (M62.81);Difficulty in walking, not elsewhere classified (R26.2);Pain Pain - part of body:  (buttock)     Time: 5409-8119 PT Time Calculation (min) (ACUTE ONLY): 29 min  Charges:    $Gait Training: 23-37 mins PT General Charges $$ ACUTE PT VISIT: 1 Visit                     Conni Slipper, PT, DPT Acute Rehabilitation Services Secure Chat Preferred Office: [phone]    Marylynn Pearson 01/11/2024, 1:51 PM

## 2024-01-11 NOTE — [place] FL2 (Signed)
 [place] MEDICAID FL2 LEVEL OF CARE FORM     IDENTIFICATION  Patient Name: Sherry Baker Birthdate: Sep 02, 1966 Sex: female Admission Date (Current Location): 12/31/2023  [facility] and [place] Number:  Producer, television/film/video and Address:  [facility], [address], [place], [place] [address]      Provider Number: [id number]  Attending Physician Name and Address:  Elgergawy, Leana Roe, MD  Relative Name and Phone Number:       Current Level of Care: Hospital Recommended Level of Care: Skilled Nursing Facility Prior Approval Number:    Date Approved/Denied: 01/03/24 PASRR Number: [id number] F expires 03/11/24  Discharge Plan: SNF    Current Diagnoses: 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   History of colon cancer    Normocytic anemia    Metabolic acidemia    Acquired hypothyroidism    HTN (hypertension)    Right hip pain causing ambulatory dysfunction 11/26/2021   CKD (chronic kidney disease), stage III (HCC) 11/25/2021   Physical deconditioning    Ambulatory dysfunction    Protein-calorie malnutrition, severe 06/25/2021   Homelessness 06/23/2021   Alcoholic cirrhosis of liver without ascites (HCC) 06/23/2021   Alcohol abuse 06/14/2019   Pressure injury of skin 06/06/2019   Genetic testing 03/24/2017   GERD (gastroesophageal reflux disease) 02/18/2017   Rectal cancer (HCC) 01/27/2017   Anemia 01/16/2017   Tobacco abuse    Bipolar affective disorder (HCC)    Substance abuse (HCC) 02/11/2014    Depression 02/11/2014   Thrombocytopenia (HCC) 02/11/2014    Orientation RESPIRATION BLADDER Height & Weight     Self, Time, Situation, Place  Normal Incontinent Weight: 113 lb 5.1 oz (51.4 kg) Height:  5\' 2"  (157.5 cm)  BEHAVIORAL SYMPTOMS/MOOD NEUROLOGICAL BOWEL NUTRITION STATUS      Colostomy, Continent Diet (see dc summary)  AMBULATORY STATUS COMMUNICATION OF NEEDS Skin   Limited Assist Verbally Other (Comment) (wound on coccyx and buttocks)                       Personal Care Assistance Level of Assistance  Bathing, Feeding, Dressing Bathing Assistance: Limited assistance Feeding assistance: Independent Dressing Assistance: Limited assistance     Functional Limitations Info             SPECIAL CARE FACTORS FREQUENCY  OT (By licensed OT), PT (By licensed PT)     PT Frequency: 5x OT Frequency: 3x            Contractures Contractures Info: Not present    Additional Factors Info  Code Status, Allergies, Isolation Precautions Code Status Info: Full Allergies Info: Penicillins     Isolation Precautions Info: ESBL     Current Medications (01/11/2024):  This is the current hospital active medication list Current Facility-Administered Medications  Medication Dose Route Frequency Provider Last Rate Last Admin   acetaminophen (TYLENOL) tablet 650 mg  650 mg Oral Q6H PRN Mdala-Gausi, Masiku Agatha, MD   650 mg at 01/07/24 1502   albuterol (PROVENTIL) (2.5 MG/3ML) 0.083% nebulizer solution  2.5 mg  2.5 mg Inhalation Q6H PRN Eduard Clos, MD       amLODipine (NORVASC) tablet 10 mg  10 mg Oral Daily Eduard Clos, MD   10 mg at 01/11/24 1610   diphenhydrAMINE (BENADRYL) capsule 25 mg  25 mg Oral Q6H PRN Mansy, Jan A, MD   25 mg at 01/08/24 2123   enoxaparin (LOVENOX) injection 30 mg  30 mg Subcutaneous Q24H Eduard Clos, MD   30 mg at 01/10/24 1434   feeding supplement (ENSURE ENLIVE / ENSURE PLUS) liquid 237 mL  237 mL Oral TID BM Eduard Clos, MD   237 mL at 01/10/24 2048   hydrALAZINE (APRESOLINE) tablet 100 mg  100 mg Oral Q8H Leroy Sea, MD   100 mg at 01/11/24 0534   HYDROcodone-acetaminophen (NORCO/VICODIN) 5-325 MG per tablet 1 tablet  1 tablet Oral Q6H PRN Elgergawy, Leana Roe, MD   1 tablet at 01/11/24 0842   ondansetron (ZOFRAN) injection 4 mg  4 mg Intravenous Q6H PRN Mdala-Gausi, Gwenette Greet, MD   4 mg at 01/04/24 0648   pantoprazole (PROTONIX) EC tablet 40 mg  40 mg Oral Daily Eduard Clos, MD   40 mg at 01/11/24 0842   QUEtiapine (SEROQUEL) tablet 50 mg  50 mg Oral BID Eduard Clos, MD   50 mg at 01/11/24 9604   sertraline (ZOLOFT) tablet 50 mg  50 mg Oral Daily Eduard Clos, MD   50 mg at 01/11/24 0842   traMADol (ULTRAM) tablet 50 mg  50 mg Oral Q12H PRN Elgergawy, Leana Roe, MD   50 mg at 01/10/24 2048   umeclidinium-vilanterol (ANORO ELLIPTA) 62.5-25 MCG/ACT 1 puff  1 puff Inhalation Daily Eduard Clos, MD   1 puff at 01/11/24 5409     Discharge Medications: Please see discharge summary for a list of discharge medications.  Relevant Imaging Results:  Relevant Lab Results:   Additional Information SSN: [id number] [place], [place]

## 2024-01-11 NOTE — Plan of Care (Signed)

## 2024-01-11 NOTE — Progress Notes (Signed)
 PROGRESS NOTE                                                                                                                                                                                                             Patient Demographics:    Sherry Baker, is a 58 y.o. female, DOB - May 09, 1966, UJW:[id number]  Outpatient Primary MD for the patient is Storm Frisk, MD    LOS - 10  Admit date - 12/31/2023    Chief Complaint  Patient presents with   Abdominal Pain    Hypotensive       Brief Narrative (HPI from H&P)    58 year old woman with PMH of rectal cancer s/p surgery with colostomy, HTN, GERD, depression, COPD, recurrent UTIs who presented with nausea, vomiting.  Patient reported that she had just finished a course of antibiotics.  Presented with AKI, UA concerning for UTI.  Noted that recent urine cultures grew ESBL Klebsiella.  Patient was started on meropenem.   Patient is medically stable for discharge, awaiting her insurance authorization, and paser still pending   Subjective:   No significant events overnight, afebrile, good appetite, no complaints today   Assessment  & Plan :   AKI on CKD - Baseline creatinine is 1.4-1.5.,  Creatinine back to baseline. -Renal, due to nausea, vomiting and dehydration. - no acute findings on renal ultrasound   ESBL Klebsiella UTI Urine cultures growing ESBL Klebsiella -Treated 7 days on IV meropenem. -failed outpatient nitrofurantoin treatment.   Abdominal pain. Vesiculo cutaneous fistula -Imaging on admission admission significant for cystitis, she had suprapubic tenderness, she is with known history of vesicular cutaneous fistula, -Bladder scan has been obtained with low volume, In-N-Out done this morning with 100 cc of clear urine output noted. -Urology input greatly appreciated, no further workup currently, to [facility] with [facility].   History  of rectal cancer s/p colostomy -Continue colostomy care.   Nighttime sinus bradycardia with a 2-second pause on 01/02/2024.   -Resolved after holding her high-dose Coreg at home - stable TSH, bradycardia has resolved.  HTN   - continue Norvasc, added hydralazine, stop Coreg due to above   COPD -Continue home Anoro Ellipta.   Depression.   -No acute issues supportive care.  Pulmonary nodules and fibrosis -she is following with pulmonary as an outpatient, was  due for repeat CT chest, obtained during hospital stay, right upper lobe nodules appear stable, new 5 mm right lower lobe, recommendation for 9-month follow-up. -Stable underlining interstitial septal thickening with some peripheral fibrosis -Patient was instructed to keep her outpatient pulmonary follow-up.   Chronic sacral decubitus ulcer present on admission kindly see nursing notes.  Supportive care no acute issues.  Wound care team following.      Condition - Extremely Guarded  Family Communication  : None at bedside today, discussed with her cousin 3/8  Code Status :   Full  Consults  : Urology  PUD Prophylaxis : PPI   Procedures  :     Renal US - 1. Slightly atrophic right kidney with cortical scarring but no hydronephrosis. Mildly echogenic right renal cortex consistent with medical renal disease. 2. Normal left kidney  CT - 1. No acute localizing process in the abdomen or pelvis. 2. Stable left-sided colostomy. No bowel obstruction. 3. Stable sacrococcygeal decubitus ulcer with erosive changes of the tip of the coccyx. 4. Stable diffuse bladder wall thickening. Correlate clinically for cystitis. 5. Stable chronic appearing reticular opacities in both lung bases. 6. 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 7. Small hiatal hernia. 8. Aortic atherosclerosis. Aortic Atherosclerosis (ICD10-I70.0).      Disposition Plan  :    Status is: Inpatient   DVT Prophylaxis  :    enoxaparin (LOVENOX)  injection 30 mg Start: 01/01/24 1400    Lab Results  Component Value Date   PLT 199 01/07/2024    Diet :  Diet Order             Diet Heart Room service appropriate? Yes; Fluid consistency: Thin  Diet effective now                    Inpatient Medications  Scheduled Meds:  amLODipine  10 mg Oral Daily   enoxaparin (LOVENOX) injection  30 mg Subcutaneous Q24H   feeding supplement  237 mL Oral TID BM   hydrALAZINE  100 mg Oral Q8H   pantoprazole  40 mg Oral Daily   QUEtiapine  50 mg Oral BID   sertraline  50 mg Oral Daily   umeclidinium-vilanterol  1 puff Inhalation Daily   Continuous Infusions:   PRN Meds:.acetaminophen, albuterol, diphenhydrAMINE, HYDROcodone-acetaminophen, ondansetron (ZOFRAN) IV, traMADol    Objective:   Vitals:   01/10/24 2230 01/11/24 0530 01/11/24 0751 01/11/24 0800  BP:  130/77  101/65  Pulse: 84 83  80  Resp: 17     Temp:      TempSrc:      SpO2: 95% 97% 97% 95%  Weight:      Height:        Wt Readings from Last 3 Encounters:  01/07/24 51.4 kg  12/16/23 49.2 kg  11/16/23 47.6 kg     Intake/Output Summary (Last 24 hours) at 01/11/2024 1216 Last data filed at 01/11/2024 0539 Gross per 24 hour  Intake 530.59 ml  Output 2802 ml  Net -2271.41 ml     Physical Exam  Awake Alert, Oriented X 3, No new F.N deficits, Normal affect Good air entry bilaterally RRR,No Gallops,Rubs or new Murmurs, No Parasternal Heave +ve B.Sounds, Abd Soft, No tenderness, colostomy present No Cyanosis, Clubbing or edema, No new Rash or bruise        RN pressure injury documentation: Pressure Injury 02/03/22 Sacrum Stage 4 - Full thickness tissue loss with exposed bone, tendon or muscle. (Active)  02/03/22   Location: Sacrum  Location Orientation:   Staging: Stage 4 - Full thickness tissue loss with exposed bone, tendon or muscle.  Wound Description (Comments):   Present on Admission: Yes     Pressure Injury 03/06/22 Sacrum Stage 4 - Full  thickness tissue loss with exposed bone, tendon or muscle. (Active)  03/06/22 1900  Location: Sacrum  Location Orientation:   Staging: Stage 4 - Full thickness tissue loss with exposed bone, tendon or muscle.  Wound Description (Comments):   Present on Admission: Yes     Pressure Injury 04/09/22 Buttocks Right Stage 2 -  Partial thickness loss of dermis presenting as a shallow open injury with a red, pink wound bed without slough. pink, red (Active)  04/09/22 0415  Location: Buttocks  Location Orientation: Right  Staging: Stage 2 -  Partial thickness loss of dermis presenting as a shallow open injury with a red, pink wound bed without slough.  Wound Description (Comments): pink, red  Present on Admission: Yes     Pressure Injury 04/09/22 Sacrum Upper Stage 3 -  Full thickness tissue loss. Subcutaneous fat may be visible but bone, tendon or muscle are NOT exposed. (Active)  04/09/22 0415  Location: Sacrum  Location Orientation: Upper  Staging: Stage 3 -  Full thickness tissue loss. Subcutaneous fat may be visible but bone, tendon or muscle are NOT exposed.  Wound Description (Comments):   Present on Admission: No     Pressure Injury 04/13/22 Buttocks Right Deep Tissue Pressure Injury - Purple or maroon localized area of discolored intact skin or blood-filled blister due to damage of underlying soft tissue from pressure and/or shear. (Active)  04/13/22 2042  Location: Buttocks  Location Orientation: Right  Staging: Deep Tissue Pressure Injury - Purple or maroon localized area of discolored intact skin or blood-filled blister due to damage of underlying soft tissue from pressure and/or shear.  Wound Description (Comments):   Present on Admission: No     Pressure Injury 04/13/22 Toe (Comment  which one) Anterior;Right Deep Tissue Pressure Injury - Purple or maroon localized area of discolored intact skin or blood-filled blister due to damage of underlying soft tissue from pressure and/or  shear. (Active)  04/13/22 2042  Location: Toe (Comment  which one)  Location Orientation: Anterior;Right  Staging: Deep Tissue Pressure Injury - Purple or maroon localized area of discolored intact skin or blood-filled blister due to damage of underlying soft tissue from pressure and/or shear.  Wound Description (Comments):   Present on Admission:      Pressure Injury 06/11/22 Buttocks Left Stage 2 -  Partial thickness loss of dermis presenting as a shallow open injury with a red, pink wound bed without slough. (Active)  06/11/22 1257  Location: Buttocks  Location Orientation: Left  Staging: Stage 2 -  Partial thickness loss of dermis presenting as a shallow open injury with a red, pink wound bed without slough.  Wound Description (Comments):   Present on Admission: No     Pressure Injury 06/11/22 Buttocks Right Stage 2 -  Partial thickness loss of dermis presenting as a shallow open injury with a red, pink wound bed without slough. (Active)  06/11/22 1258  Location: Buttocks  Location Orientation: Right  Staging: Stage 2 -  Partial thickness loss of dermis presenting as a shallow open injury with a red, pink wound bed without slough.  Wound Description (Comments):   Present on Admission:      Pressure Injury 10/26/22 Coccyx Mid Stage 4 -  Full thickness tissue loss with exposed bone, tendon or muscle. (Active)  10/26/22 1430  Location: Coccyx  Location Orientation: Mid  Staging: Stage 4 - Full thickness tissue loss with exposed bone, tendon or muscle.  Wound Description (Comments):   Present on Admission: Yes     Pressure Injury 10/26/22 Knee Anterior;Left Deep Tissue Pressure Injury - Purple or maroon localized area of discolored intact skin or blood-filled blister due to damage of underlying soft tissue from pressure and/or shear. (Active)  10/26/22 1330  Location: Knee  Location Orientation: Anterior;Left  Staging: Deep Tissue Pressure Injury - Purple or maroon localized area  of discolored intact skin or blood-filled blister due to damage of underlying soft tissue from pressure and/or shear.  Wound Description (Comments):   Present on Admission: Yes     Pressure Injury 10/28/22 Elbow Posterior;Right Stage 1 -  Intact skin with non-blanchable redness of a localized area usually over a bony prominence. (Active)  10/28/22 1235  Location: Elbow  Location Orientation: Posterior;Right  Staging: Stage 1 -  Intact skin with non-blanchable redness of a localized area usually over a bony prominence.  Wound Description (Comments):   Present on Admission: Yes     Pressure Injury 10/28/22 Elbow Left;Posterior Stage 1 -  Intact skin with non-blanchable redness of a localized area usually over a bony prominence. (Active)  10/28/22 1235  Location: Elbow  Location Orientation: Left;Posterior  Staging: Stage 1 -  Intact skin with non-blanchable redness of a localized area usually over a bony prominence.  Wound Description (Comments):   Present on Admission:      Pressure Injury 01/01/24 Coccyx Mid Deep Open Red Wound (Active)  01/01/24 1800  Location: Coccyx  Location Orientation: Mid  Staging:   Wound Description (Comments): Deep Open Red Wound  Present on Admission: Yes  Dressing Type Foam - Lift dressing to assess site every shift 01/11/24 0746      Data Review:    Recent Labs  Lab 01/05/24 0459 01/06/24 0523 01/07/24 0559  WBC 10.0 9.6 10.5  HGB 10.9* 10.6* 10.4*  HCT 32.9* 31.5* 31.7*  PLT 199 192 199  MCV 83.3 83.6 83.6  MCH 27.6 28.1 27.4  MCHC 33.1 33.7 32.8  RDW 18.0* 18.0* 17.9*  LYMPHSABS 2.2 1.4 1.9  MONOABS 1.0 0.6 0.9  EOSABS 2.4* 3.0* 2.8*  BASOSABS 0.0 0.1 0.1    Recent Labs  Lab 01/05/24 0459 01/06/24 0523 01/07/24 0559 01/10/24 1356  NA 133* 134* 134* 133*  K 4.8 4.6 4.6 4.4  CL 100 101 103 102  CO2 25 20* 21* 20*  ANIONGAP 8 13 10 11   GLUCOSE 93 114* 90 88  BUN 41* 49* 56* 52*  CREATININE 1.82* 1.83* 2.00* 1.58*  CRP 1.4*  1.2*  --   --   PROCALCITON 0.34 0.18 0.11  --   BNP 148.5* 114.0*  --   --   MG 1.9 2.0  --   --   CALCIUM 8.4* 8.5* 8.5* 8.9      Recent Labs  Lab 01/05/24 0459 01/06/24 0523 01/07/24 0559 01/10/24 1356  CRP 1.4* 1.2*  --   --   PROCALCITON 0.34 0.18 0.11  --   BNP 148.5* 114.0*  --   --   MG 1.9 2.0  --   --   CALCIUM 8.4* 8.5* 8.5* 8.9    --------------------------------------------------------------------------------------------------------------- Lab Results  Component Value Date   TRIG 176 (H) 11/03/2022    Lab Results  Component Value Date  HGBA1C 5.0 02/06/2022   No results for input(s): "TSH", "T4TOTAL", "FREET4", "T3FREE", "THYROIDAB" in the last 72 hours.  No results for input(s): "VITAMINB12", "FOLATE", "FERRITIN", "TIBC", "IRON", "RETICCTPCT" in the last 72 hours. ------------------------------------------------------------------------------------------------------------------ Cardiac Enzymes No results for input(s): "CKMB", "TROPONINI", "MYOGLOBIN" in the last 168 hours.  Invalid input(s): "CK"  Micro Results Recent Results (from the past 240 hours)  MRSA Next Gen by PCR, Nasal     Status: None   Collection Time: 01/07/24  8:34 PM   Specimen: Nasal Mucosa; Nasal Swab  Result Value Ref Range Status   MRSA by PCR Next Gen NOT DETECTED NOT DETECTED Final    Comment: (NOTE) The GeneXpert MRSA Assay (FDA approved for NASAL specimens only), is one component of a comprehensive MRSA colonization surveillance program. It is not intended to diagnose MRSA infection nor to guide or monitor treatment for MRSA infections. Test performance is not FDA approved in patients less than 43 years old. Performed at [facility], [address], [place], [place] [address]     Radiology Report No results found.     Signature  -   Huey Bienenstock M.D on 01/11/2024 at 12:16 PM   -  To page go to [email or web address]

## 2024-01-11 NOTE — Progress Notes (Signed)
 Mobility Specialist Progress Note:   01/11/24 1225  Mobility  Activity Ambulated with assistance in hallway  Level of Assistance Contact guard assist, steadying assist  Assistive Device Front wheel walker  Distance Ambulated (ft) 150 ft  Activity Response Tolerated well  Mobility Referral Yes  Mobility visit 1 Mobility  Mobility Specialist Start Time (ACUTE ONLY) 1050  Mobility Specialist Stop Time (ACUTE ONLY) 1103  Mobility Specialist Time Calculation (min) (ACUTE ONLY) 13 min   Pt received in bed agreeable to mobility. C/o mild stomach pain otherwise no c/o. Distance limited d/t fatigue and HR getting to 140. Returned to room w/o fault. Left in bed w/ call bell and personal belongings in reach. All needs met. HR 112.  During Mobility 140 Post Mobility 112  Thompson Grayer Mobility Specialist  Please contact vis Secure Chat or  Rehab Office [phone]

## 2024-01-11 NOTE — TOC Progression Note (Addendum)
 Transition of Care [facility]) - Progression Note    Patient Details  Name: Sherry Baker MRN: [id number] Date of Birth: 10/06/1966  Transition of [facility]) CM/SW Contact  Mearl Latin, LCSW Phone Number: 01/11/2024, 9:00 AM  Clinical Narrative:    9am-Awaiting insurance authorization and pasrr.  12:14 PM-Pasrr received and placed on Fl2. Awaiting insurance auth.  Expected Discharge Plan: Skilled Nursing Facility Barriers to Discharge: Continued Medical Work up, English as a second language teacher, SNF Pending bed offer  Expected Discharge Plan and Services     Post Acute Care Choice: Skilled Nursing Facility Living arrangements for the past 2 months: Single Family Home                                       Social Determinants of Health (SDOH) Interventions SDOH Screenings   Food Insecurity: Patient Unable To Answer (01/03/2024)  Recent Concern: Food Insecurity - Food Insecurity Present (10/12/2023)  Housing: Patient Unable To Answer (01/03/2024)  Recent Concern: Housing - Medium Risk (10/12/2023)  Transportation Needs: Patient Unable To Answer (01/03/2024)  Recent Concern: Transportation Needs - Unmet Transportation Needs (10/12/2023)  Utilities: Patient Unable To Answer (01/03/2024)  Depression (PHQ2-9): High Risk (12/16/2023)  Financial Resource Strain: Low Risk  (10/12/2023)  Physical Activity: Inactive (10/12/2023)  Social Connections: Socially Isolated (10/12/2023)  Stress: Stress Concern Present (10/12/2023)  Tobacco Use: High Risk (01/06/2024)  Health Literacy: Adequate Health Literacy (10/12/2023)    Readmission Risk Interventions    06/16/2022    4:54 PM 04/17/2022   11:21 AM 12/10/2021    1:58 PM  Readmission Risk Prevention Plan  Transportation Screening Complete Complete Complete  Medication Review (RN Care Manager)  Complete Complete  PCP or Specialist appointment within 3-5 days of discharge Not Complete Complete Complete  PCP/Specialist Appt Not Complete comments patient  needs to make apt    HRI or Home Care Consult Complete Complete Not Complete  HRI or Home Care Consult Pt Refusal Comments   history of unsafe home environment  SW Recovery Care/Counseling Consult  Complete Complete  Palliative Care Screening  Not Applicable Not Applicable  Skilled Nursing Facility Patient Refused Patient Refused Not Applicable

## 2024-01-12 DIAGNOSIS — N390 Urinary tract infection, site not specified: Secondary | ICD-10-CM | POA: Diagnosis not present

## 2024-01-12 DIAGNOSIS — N179 Acute kidney failure, unspecified: Secondary | ICD-10-CM | POA: Diagnosis not present

## 2024-01-12 MED ORDER — HYDRALAZINE HCL 100 MG PO TABS: 100.0000 mg | ORAL_TABLET | Freq: Three times a day (TID) | ORAL | Status: DC

## 2024-01-12 MED ORDER — HYDROCODONE-ACETAMINOPHEN 5-325 MG PO TABS
1.0000 | ORAL_TABLET | Freq: Four times a day (QID) | ORAL | 0 refills | Status: DC | PRN
Start: 2024-01-12 — End: 2024-04-26

## 2024-01-12 NOTE — Progress Notes (Signed)
 Report called and given to receiving facility nurse, Elaina Hoops. at [facility].

## 2024-01-12 NOTE — Progress Notes (Signed)
  Discharge instructions (including medications) discussed with and copy provided to patient/receiving facility.
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
## 2024-01-12 NOTE — Discharge Summary (Signed)
 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 Earlene Plater, Wayna Chalet III  , please help to arrange for this appointment Patient to have routine follow-up with [facility] as an outpatient regarding her pulmonary nodules, CT scan was done during the hospital, please help to arrange for this appointment   Diet recommendation: Heart Healthy   Brief/Interim Summary: 58 year old woman with PMH of rectal cancer s/p surgery with colostomy, HTN, GERD, depression, COPD, recurrent UTIs who presented with nausea, vomiting.  Patient reported that she had just finished a course of antibiotics.  Presented with AKI, UA concerning for UTI.  Noted that recent urine cultures grew ESBL Klebsiella.  Patient was started on meropenem. She finished 7 days on meropenem.   AKI on CKD - Baseline creatinine is 1.4-1.5.,  Creatinine back to baseline. -Renal, due to nausea, vomiting and dehydration. - no acute findings on renal ultrasound   ESBL Klebsiella UTI Urine cultures growing ESBL Klebsiella -Treated 7 days on IV meropenem. -failed outpatient nitrofurantoin treatment.   Abdominal pain. Vesiculo cutaneous fistula -Imaging on admission significant for cystitis, she had suprapubic tenderness, she is with known history of vesicular cutaneous fistula, with no severe retention bladder scan. -Urology input greatly appreciated, no further workup currently, to [facility].   History of rectal cancer s/p colostomy -Continue colostomy care.   Nighttime sinus bradycardia with a 2-second pause on 01/02/2024.   -Resolved after holding  her high-dose Coreg at home, continue to hold Coreg on discharge - stable TSH, bradycardia has resolved.   HTN   - continue Norvasc, added hydralazine, stop Coreg due to above   COPD -Continue home Anoro Ellipta.   Depression.   -No acute issues supportive care.   Pulmonary nodules and fibrosis -she is following with pulmonary as an outpatient, was due for repeat CT chest, obtained during hospital stay, right upper lobe nodules appear stable, new 5 mm right lower lobe, recommendation for 79-month follow-up. -Stable underlining interstitial septal thickening with some peripheral fibrosis -Patient was instructed to keep her outpatient pulmonary follow-up.     Chronic sacral decubitus ulcer present on admission kindly see nursing notes.  Pressure injury to coccyx, deep.  Wound -Continue with wound care instruction as below.  Discharge Diagnoses:  Principal Problem:   ARF (acute renal failure) (HCC) Active Problems:   Alcoholic cirrhosis of liver without ascites (HCC)   Bipolar affective disorder (HCC)   GERD (gastroesophageal reflux disease)   Pressure injury of skin   HTN (hypertension)   COPD with emphysema (HCC)   Pressure ulcer of sacral region, stage 4 (HCC)   Urinary tract infection without hematuria   Nausea & vomiting   Acute kidney injury [facility])    Discharge Instructions  Discharge Instructions     Diet - low sodium heart healthy   Complete by: As directed    Discharge instructions   Complete by: As directed    Follow with Primary MD Storm Frisk, MD /SNF physician in 3 days  Get CBC, CMP,  checked  by Primary MD next visit.    Activity: As tolerated with Full fall precautions use walker/cane & assistance as needed  Disposition Home    Diet: Heart Healthy    On your next visit with your primary care physician please Get Medicines reviewed and adjusted.   Please request your Prim.MD to go over all Hospital Tests and Procedure/Radiological  results at the follow up, please get all Hospital records sent to your Prim MD by signing hospital release before you go home.   If you experience worsening of your admission symptoms, develop shortness of breath, life threatening emergency, suicidal or homicidal thoughts you must seek medical attention immediately by calling 911 or calling your MD immediately  if symptoms less severe.  You Must read complete instructions/literature along with all the possible adverse reactions/side effects for all the Medicines you take and that have been prescribed to you. Take any new Medicines after you have completely understood and accpet all the possible adverse reactions/side effects.   Do not drive, operating heavy machinery, perform activities at heights, swimming or participation in water activities or provide baby sitting services if your were admitted for syncope or siezures until you have seen by Primary MD or a Neurologist and advised to do so again.  Do not drive when taking Pain medications.    Do not take more than prescribed Pain, Sleep and Anxiety Medications  Special Instructions: If you have smoked or chewed Tobacco  in the last 2 yrs please stop smoking, stop any regular Alcohol  and or any Recreational drug use.  Wear Seat belts while driving.   Please note  You were cared for by a hospitalist during your hospital stay. If you have any questions about your discharge medications or the care you received while you were in the hospital after you are discharged, you can call the unit and asked to speak with the hospitalist on call if the hospitalist that took care of you is not available. Once you are discharged, your primary care physician will handle any further medical issues. Please note that NO REFILLS for any discharge medications will be authorized once you are discharged, as it is imperative that you return to your primary care physician (or establish a relationship with a primary  care physician if you do not have one) for your aftercare needs so that they can reassess your need for medications and monitor your lab values.   Discharge wound care:   Complete by: As directed    Pressure Injury 01/01/24 Coccyx Mid Deep Open Red Wound  Wound care  Daily      Comments: cleanse sacral wound with VASHE (LAWSON [id number])  and pat dry.  Fill wound depth with VASHE moist kerlix and secure with ABD pad and tape.  Change daily  01/02/24 1235   Increase activity slowly   Complete by: As directed       Allergies as of 01/12/2024       Reactions   Penicillins Hives, Swelling, Rash   Tolerated Unasyn, Rocephin Did it involve swelling of the face/tongue/throat, SOB, or low BP? Yes Did it involve sudden or severe rash/hives, skin peeling, or any reaction on the inside of your mouth or nose? No Did you need to seek medical attention at a hospital or doctor's office? No When did it last happen?  <10 years   If all above answers are "NO", may proceed with cephalosporin use. Has patient had a PCN reaction causing immediate rash, facial/tongue/throat swelling, SOB or lightheadedness with hypotension: Yes, Has patient had a PCN reaction causing severe rash involving mucus membranes or skin necrosis: No,  Has patient had a PCN reaction that required hospitalization No, Has patient had a PCN reaction occurring within the last 10 years: Yes, If all of the above answers are "NO", then may proceed with Cephalosporin use.        Medication List     STOP taking these medications    acetaminophen-codeine 300-30 MG tablet Commonly known as: TYLENOL #3   carvedilol 25 MG tablet Commonly known as: COREG   fluconazole 150 MG tablet Commonly known as: DIFLUCAN   nitrofurantoin (macrocrystal-monohydrate) 100 MG capsule Commonly known as: Macrobid   potassium chloride 10 MEQ tablet Commonly known as: KLOR-CON   valsartan 160 MG tablet Commonly known as: DIOVAN       TAKE these  medications    albuterol 108 (90 Base) MCG/ACT inhaler Commonly known as: VENTOLIN HFA Inhale 2 puffs into the lungs every 6 (six) hours as needed for wheezing or shortness of breath.   amLODipine 10 MG tablet Commonly known as: NORVASC Take 1 tablet (10 mg total) by mouth daily.   Anoro Ellipta 62.5-25 MCG/ACT Aepb Generic drug: umeclidinium-vilanterol Inhale 1 puff into the lungs daily. What changed:  when to take this reasons to take this   feeding supplement Liqd Take 237 mLs by mouth 3 (three) times daily between meals.   hydrALAZINE 100 MG tablet Commonly known as: APRESOLINE Take 1 tablet (100 mg total) by mouth every 8 (eight) hours.   HYDROcodone-acetaminophen 5-325 MG tablet Commonly known as: NORCO/VICODIN Take 1 tablet by mouth every 6 (six) hours as needed for severe pain (pain score 7-10).   ondansetron 4 MG tablet Commonly known as: Zofran Take 1 tablet (4 mg total) by mouth every 8 (eight) hours as needed for nausea or vomiting.   pantoprazole 40 MG tablet Commonly known as: PROTONIX Take 1 tablet (40 mg total) by mouth daily.   QUEtiapine 50 MG tablet Commonly known as: SEROQUEL Take 1 tablet (50 mg total) by mouth 2 (two) times daily.   sertraline 50 MG tablet Commonly known as: ZOLOFT Take 1 tablet (50 mg total) by mouth daily.   sodium bicarbonate 650 MG tablet Take 1 tablet (650 mg total) by mouth daily.               Discharge Care Instructions  (From admission, onward)           Start     Ordered   01/12/24 0000  Discharge wound care:       Comments: Pressure Injury 01/01/24 Coccyx Mid Deep Open Red Wound  Wound care  Daily      Comments: cleanse sacral wound with VASHE (LAWSON [id number])  and pat dry.  Fill wound depth with VASHE moist kerlix and secure with ABD pad and tape.  Change daily  01/02/24 1235   01/12/24 1521            Contact information for after-discharge care     Destination     [facility] .   Service: Skilled Nursing Contact information: [address] [place] [address] [phone]                    Allergies  Allergen Reactions   Penicillins Hives, Swelling and Rash    Tolerated Unasyn, Rocephin  Did it involve swelling of the face/tongue/throat, SOB, or low BP? Yes  Did it involve sudden or severe rash/hives, skin peeling, or any reaction on the inside of your mouth or  nose? No  Did you need to seek medical attention at a hospital or doctor's office? No  When did it last happen?  <10 years    If all above answers are "NO", may proceed with cephalosporin use.  Has patient had a PCN reaction causing immediate rash, facial/tongue/throat swelling, SOB or lightheadedness with hypotension: Yes, Has patient had a PCN reaction causing severe rash involving mucus membranes or skin necrosis: No, Has patient had a PCN reaction that required hospitalization No, Has patient had a PCN reaction occurring within the last 10 years: Yes, If all of the above answers are "NO", then may proceed with Cephalosporin use.    Consultations: Urology   Procedures/Studies: CT CHEST WO CONTRAST Result Date: 01/08/2024 CLINICAL DATA:  Lung nodules. EXAM: CT CHEST WITHOUT CONTRAST TECHNIQUE: Multidetector CT imaging of the chest was performed following the standard protocol without IV contrast. RADIATION DOSE REDUCTION: This exam was performed according to the departmental dose-optimization program which includes automated exposure control, adjustment of the mA and/or kV according to patient size and/or use of iterative reconstruction technique. COMPARISON:  CT 03/06/2022. FINDINGS: Cardiovascular: Small pericardial effusion, new from prior CT scan. Coronary calcifications are seen. Heart is nonenlarged. The thoracic aorta is normal course and caliber with scattered vascular calcifications. Mediastinum/Nodes: No specific abnormal lymph node enlargement identified in  the axillary regions, hilum or mediastinum. A few small less than 1 cm size mediastinal nodes are seen, not pathologic by size criteria and unchanged. Preserved thyroid gland. Small hiatal hernia. Lungs/Pleura: No consolidation, pneumothorax or effusion. There are areas of interstitial septal thickening bilaterally with honeycombing and fibrosis. Previously there was more motion on than on today's examination. On the prior there is a 7 mm right upper lobe lung nodule laterally at the apex. Today this nodule measures 5 mm on series 7, image 21. The difference could be related to the motion. Small ground-glass nodules in the right lower lobe such as 5 mm on series 7 image image 81. Adjacent focus measures 4 mm. These also appear cavitary. Further tiny nodules elsewhere. Upper Abdomen: The adrenal glands are incompletely included in the imaging field. Visualized portions are preserved. Liver appears slightly enlarged. Musculoskeletal: Slight curvature of the spine. Scattered degenerative changes. IMPRESSION: Right upper lobe nodule appears slightly smaller today but the differences in appearance could relate to level of motion on the prior examination. There are some new ground-glass subtle cavitary nodules in the right lower lobe measuring up to 5 mm as well. With these changes would recommend follow-up imaging in 6 months. Underlying interstitial septal thickening with some peripheral fibrosis. New small pericardial effusion. Small hiatal hernia. Significant coronary artery calcifications. Please correlate for other coronary risk factors. Aortic Atherosclerosis (ICD10-I70.0) and Emphysema (ICD10-J43.9). Electronically Signed   By: Karen Kays M.D.   On: 01/08/2024 10:50   US RENAL Result Date: 01/02/2024 CLINICAL DATA:  Acute kidney injury EXAM: RENAL / URINARY TRACT ULTRASOUND COMPLETE COMPARISON:  CT 12/31/2023 FINDINGS: Right Kidney: Renal measurements: 8.5 x 4 x 4 cm = volume: 69.5 mL. Lobulated contour with  areas of cortical scarring and mild atrophy. Cortex slightly echogenic. No mass or hydronephrosis Left Kidney: Renal measurements: 12.1 x 5.6 x 4.2 cm = volume: 147.7 mL. Echogenicity within normal limits. No mass or hydronephrosis visualized. Bladder: Appears normal for degree of bladder distention. Other: None. IMPRESSION: 1. Slightly atrophic right kidney with cortical scarring but no hydronephrosis. Mildly echogenic right renal cortex consistent with medical renal disease. 2. Normal left kidney  Electronically Signed   By: Jasmine Pang M.D.   On: 01/02/2024 15:41   CT ABDOMEN PELVIS WO CONTRAST Result Date: 12/31/2023 CLINICAL DATA:  Abdominal pain with nausea and vomiting. History of rectal cancer with resection and colostomy. EXAM: CT ABDOMEN AND PELVIS WITHOUT CONTRAST TECHNIQUE: Multidetector CT imaging of the abdomen and pelvis was performed following the standard protocol without IV contrast. RADIATION DOSE REDUCTION: This exam was performed according to the departmental dose-optimization program which includes automated exposure control, adjustment of the mA and/or kV according to patient size and/or use of iterative reconstruction technique. COMPARISON:  CT abdomen and pelvis 11/16/2023 FINDINGS: Lower chest: Chronic appearing reticular opacities are again noted in both lung bases. Hepatobiliary: No focal liver abnormality is seen. No gallstones, gallbladder wall thickening, or biliary dilatation. Pancreas: Unremarkable. No pancreatic ductal dilatation or surrounding inflammatory changes. Spleen: Normal in size without focal abnormality. Adrenals/Urinary Tract: Diffuse bladder wall thickening is again noted. There is no hydronephrosis. Mild right renal atrophy is again seen. No urinary tract calculi. Adrenal glands are within normal limits. Stomach/Bowel: Left-sided colostomy is again noted. There is no evidence for bowel obstruction, pneumatosis, acute inflammation or free air. The appendix is within  normal limits. There is a small hiatal hernia. The stomach is otherwise within normal limits. Vascular/Lymphatic: Prominent vascular calcifications are again seen. Aorta and IVC are normal in size. No enlarged lymph nodes are identified allowing for lack of intravenous contrast. Reproductive: Uterus and bilateral adnexa are unremarkable. Other: Presacral thickening and stranding are again noted, unchanged. There is no ascites. Musculoskeletal: Sacrococcygeal decubitus ulcer with erosive changes of the tip of the coccyx appear unchanged. Severe/end-stage degenerative changes of both hips again noted. IMPRESSION: 1. No acute localizing process in the abdomen or pelvis. 2. Stable left-sided colostomy. No bowel obstruction. 3. Stable sacrococcygeal decubitus ulcer with erosive changes of the tip of the coccyx. 4. Stable diffuse bladder wall thickening. Correlate clinically for cystitis. 5. Stable chronic appearing reticular opacities in both lung bases. 6. 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 7. Small hiatal hernia. 8. Aortic atherosclerosis. Aortic Atherosclerosis (ICD10-I70.0). Electronically Signed   By: Darliss Cheney M.D.   On: 12/31/2023 23:53      Subjective:  No significant events overnight, she is eager for discharge Discharge Exam: Vitals:   01/12/24 1143 01/12/24 1200  BP: 111/62 120/62  Pulse: 82 89  Resp:    Temp: 98.7 F (37.1 C)   SpO2: 94% 96%   Vitals:   01/12/24 0549 01/12/24 0841 01/12/24 1143 01/12/24 1200  BP: (!) 107/57 118/64 111/62 120/62  Pulse:  84 82 89  Resp:  16    Temp:  98.7 F (37.1 C) 98.7 F (37.1 C)   TempSrc:  Oral Oral   SpO2:  96% 94% 96%  Weight:      Height:        Awake Alert, Oriented X 3,   CTAB RRR +ve B.Sounds, Abd Soft, colostomy present No Cyanosis, Clubbing or edema        The results of significant diagnostics from this hospitalization (including imaging, microbiology, ancillary and laboratory) are listed  below for reference.     Microbiology: Recent Results (from the past 240 hours)  MRSA Next Gen by PCR, Nasal     Status: None   Collection Time: 01/07/24  8:34 PM   Specimen: Nasal Mucosa; Nasal Swab  Result Value Ref Range Status   MRSA by PCR Next Gen NOT DETECTED NOT DETECTED Final  Comment: (NOTE) The GeneXpert MRSA Assay (FDA approved for NASAL specimens only), is one component of a comprehensive MRSA colonization surveillance program. It is not intended to diagnose MRSA infection nor to guide or monitor treatment for MRSA infections. Test performance is not FDA approved in patients less than 14 years old. Performed at [facility], [address] [place], [place] [address]      Labs: BNP (last 3 results) Recent Labs    01/04/24 0506 01/05/24 0459 01/06/24 0523  BNP 159.3* 148.5* 114.0*   Basic Metabolic Panel: Recent Labs  Lab 01/06/24 0523 01/07/24 0559 01/10/24 1356  NA 134* 134* 133*  K 4.6 4.6 4.4  CL 101 103 102  CO2 20* 21* 20*  GLUCOSE 114* 90 88  BUN 49* 56* 52*  CREATININE 1.83* 2.00* 1.58*  CALCIUM 8.5* 8.5* 8.9  MG 2.0  --   --    Liver Function Tests: No results for input(s): "AST", "ALT", "ALKPHOS", "BILITOT", "PROT", "ALBUMIN" in the last 168 hours. No results for input(s): "LIPASE", "AMYLASE" in the last 168 hours. No results for input(s): "AMMONIA" in the last 168 hours. CBC: Recent Labs  Lab 01/06/24 0523 01/07/24 0559  WBC 9.6 10.5  NEUTROABS 3.7 3.7  HGB 10.6* 10.4*  HCT 31.5* 31.7*  MCV 83.6 83.6  PLT 192 199   Cardiac Enzymes: No results for input(s): "CKTOTAL", "CKMB", "CKMBINDEX", "TROPONINI" in the last 168 hours. BNP: Invalid input(s): "POCBNP" CBG: No results for input(s): "GLUCAP" in the last 168 hours. D-Dimer No results for input(s): "DDIMER" in the last 72 hours. Hgb A1c No results for input(s): "HGBA1C" in the last 72 hours. Lipid Profile No results for input(s): "CHOL", "HDL", "LDLCALC", "TRIG",  "CHOLHDL", "LDLDIRECT" in the last 72 hours. Thyroid function studies No results for input(s): "TSH", "T4TOTAL", "T3FREE", "THYROIDAB" in the last 72 hours.  Invalid input(s): "FREET3" Anemia work up No results for input(s): "VITAMINB12", "FOLATE", "FERRITIN", "TIBC", "IRON", "RETICCTPCT" in the last 72 hours. Urinalysis    Component Value Date/Time   COLORURINE YELLOW 12/31/2023 2334   APPEARANCEUR TURBID (A) 12/31/2023 2334   APPEARANCEUR Cloudy (A) 12/16/2023 1050   LABSPEC 1.018 12/31/2023 2334   PHURINE 5.0 12/31/2023 2334   GLUCOSEU NEGATIVE 12/31/2023 2334   HGBUR LARGE (A) 12/31/2023 2334   BILIRUBINUR NEGATIVE 12/31/2023 2334   BILIRUBINUR Negative 12/16/2023 1050   KETONESUR NEGATIVE 12/31/2023 2334   PROTEINUR 100 (A) 12/31/2023 2334   UROBILINOGEN 0.2 10/12/2023 1630   UROBILINOGEN 0.2 04/22/2022 1151   NITRITE POSITIVE (A) 12/31/2023 2334   LEUKOCYTESUR MODERATE (A) 12/31/2023 2334   Sepsis Labs Recent Labs  Lab 01/06/24 0523 01/07/24 0559  WBC 9.6 10.5   Microbiology Recent Results (from the past 240 hours)  MRSA Next Gen by PCR, Nasal     Status: None   Collection Time: 01/07/24  8:34 PM   Specimen: Nasal Mucosa; Nasal Swab  Result Value Ref Range Status   MRSA by PCR Next Gen NOT DETECTED NOT DETECTED Final    Comment: (NOTE) The GeneXpert MRSA Assay (FDA approved for NASAL specimens only), is one component of a comprehensive MRSA colonization surveillance program. It is not intended to diagnose MRSA infection nor to guide or monitor treatment for MRSA infections. Test performance is not FDA approved in patients less than 21 years old. Performed at [facility], [address] [place], [place] [address]      Time coordinating discharge: Over 30 minutes  SIGNED:   Huey Bienenstock,  MD  [facility] 01/12/2024, 3:21 PM Pager   If 7PM-7AM, please contact night-coverage [email or web address]

## 2024-01-12 NOTE — TOC Progression Note (Addendum)
 Transition of Care [facility]) - Progression Note    Patient Details  Name: Sherry Baker MRN: [id number] Date of Birth: [birth year]  [facility] CM/SW Contact  Mearl Latin, LCSW Phone Number: 01/12/2024, 9:20 AM  Clinical Narrative:    9:20 AM-Insurance approval pending for [facility].  4:22 PM-Insurance approval received by [facility] and they can accept patient today. CSW updated patient's cousin, Aggie Cosier, who can transport patient by car around 5:45pm. CSW updated facility.    Expected Discharge Plan: Skilled Nursing Facility Barriers to Discharge: Continued Medical Work up, English as a second language teacher, SNF Pending bed offer  Expected Discharge Plan and Services     Post Acute Care Choice: Skilled Nursing Facility Living arrangements for the past 2 months: Single Family Home                                       Social Determinants of Health (SDOH) Interventions SDOH Screenings   Food Insecurity: Patient Unable To Answer (01/03/2024)  Recent Concern: Food Insecurity - Food Insecurity Present (10/12/2023)  Housing: Patient Unable To Answer (01/03/2024)  Recent Concern: Housing - Medium Risk (10/12/2023)  Transportation Needs: Patient Unable To Answer (01/03/2024)  Recent Concern: Transportation Needs - Unmet Transportation Needs (10/12/2023)  Utilities: Patient Unable To Answer (01/03/2024)  Depression (PHQ2-9): High Risk (12/16/2023)  Financial Resource Strain: Low Risk  (10/12/2023)  Physical Activity: Inactive (10/12/2023)  Social Connections: Socially Isolated (10/12/2023)  Stress: Stress Concern Present (10/12/2023)  Tobacco Use: High Risk (01/06/2024)  Health Literacy: Adequate Health Literacy (10/12/2023)    Readmission Risk Interventions    06/16/2022    4:54 PM 04/17/2022   11:21 AM 12/10/2021    1:58 PM  Readmission Risk Prevention Plan  Transportation Screening Complete Complete Complete  Medication Review (RN Care Manager)  Complete Complete  PCP or  Specialist appointment within 3-5 days of discharge Not Complete Complete Complete  PCP/Specialist Appt Not Complete comments patient needs to make apt    HRI or Home Care Consult Complete Complete Not Complete  HRI or Home Care Consult Pt Refusal Comments   history of unsafe home environment  SW Recovery Care/Counseling Consult  Complete Complete  Palliative Care Screening  Not Applicable Not Applicable  Skilled Nursing Facility Patient Refused Patient Refused Not Applicable

## 2024-01-12 NOTE — Discharge Instructions (Signed)
 Follow with Primary MD Storm Frisk, MD /SNF physician in 3 days  Get CBC, CMP,  checked  by Primary MD next visit.    Activity: As tolerated with Full fall precautions use walker/cane & assistance as needed   Disposition Home    Diet: Heart Healthy    On your next visit with your primary care physician please Get Medicines reviewed and adjusted.   Please request your Prim.MD to go over all Hospital Tests and Procedure/Radiological results at the follow up, please get all Hospital records sent to your Prim MD by signing hospital release before you go home.   If you experience worsening of your admission symptoms, develop shortness of breath, life threatening emergency, suicidal or homicidal thoughts you must seek medical attention immediately by calling 911 or calling your MD immediately  if symptoms less severe.  You Must read complete instructions/literature along with all the possible adverse reactions/side effects for all the Medicines you take and that have been prescribed to you. Take any new Medicines after you have completely understood and accpet all the possible adverse reactions/side effects.   Do not drive, operating heavy machinery, perform activities at heights, swimming or participation in water activities or provide baby sitting services if your were admitted for syncope or siezures until you have seen by Primary MD or a Neurologist and advised to do so again.  Do not drive when taking Pain medications.    Do not take more than prescribed Pain, Sleep and Anxiety Medications  Special Instructions: If you have smoked or chewed Tobacco  in the last 2 yrs please stop smoking, stop any regular Alcohol  and or any Recreational drug use.  Wear Seat belts while driving.   Please note  You were cared for by a hospitalist during your hospital stay. If you have any questions about your discharge medications or the care you received while you were in the hospital after  you are discharged, you can call the unit and asked to speak with the hospitalist on call if the hospitalist that took care of you is not available. Once you are discharged, your primary care physician will handle any further medical issues. Please note that NO REFILLS for any discharge medications will be authorized once you are discharged, as it is imperative that you return to your primary care physician (or establish a relationship with a primary care physician if you do not have one) for your aftercare needs so that they can reassess your need for medications and monitor your lab values.

## 2024-01-12 NOTE — Progress Notes (Signed)
 PROGRESS NOTE                                                                                                                                                                                                             Patient Demographics:    Sherry Baker, is a 58 y.o. female, DOB - 23-Jan-1966, JXB:[id number]  Outpatient Primary MD for the patient is Storm Frisk, MD    LOS - 11  Admit date - 12/31/2023    Chief Complaint  Patient presents with   Abdominal Pain    Hypotensive       Brief Narrative (HPI from H&P)     58 year old woman with PMH of rectal cancer s/p surgery with colostomy, HTN, GERD, depression, COPD, recurrent UTIs who presented with nausea, vomiting.  Patient reported that she had just finished a course of antibiotics.  Presented with AKI, UA concerning for UTI.  Noted that recent urine cultures grew ESBL Klebsiella.  Patient was started on meropenem. She finished 7 days on meropenem.  Patient is medically stable for discharge, awaiting her insurance authorization   Subjective:   No significant events overnight, had any complaints today, asking about her placement plan half insurance authorization has been obtained.   Assessment  & Plan :   AKI on CKD - Baseline creatinine is 1.4-1.5.,  Creatinine back to baseline. -Renal, due to nausea, vomiting and dehydration. - no acute findings on renal ultrasound   ESBL Klebsiella UTI Urine cultures growing ESBL Klebsiella -Treated 7 days on IV meropenem. -failed outpatient nitrofurantoin treatment.   Abdominal pain. Vesiculo cutaneous fistula -Imaging on admission admission significant for cystitis, she had suprapubic tenderness, she is with known history of vesicular cutaneous fistula, -Bladder scan has been obtained with low volume, In-N-Out done this morning with 100 cc of clear urine output noted. -Urology input greatly appreciated, no further  workup currently, to [facility].   History of rectal cancer s/p colostomy -Continue colostomy care.   Nighttime sinus bradycardia with a 2-second pause on 01/02/2024.   -Resolved after holding her high-dose Coreg at home - stable TSH, bradycardia has resolved.  HTN   - continue Norvasc, added hydralazine, stop Coreg due to above   COPD -Continue home Anoro Ellipta.   Depression.   -No acute issues supportive care.  Pulmonary nodules  and fibrosis -she is following with pulmonary as an outpatient, was due for repeat CT chest, obtained during hospital stay, right upper lobe nodules appear stable, new 5 mm right lower lobe, recommendation for 39-month follow-up. -Stable underlining interstitial septal thickening with some peripheral fibrosis -Patient was instructed to keep her outpatient pulmonary follow-up.   Chronic sacral decubitus ulcer present on admission kindly see nursing notes.  Supportive care no acute issues.  Wound care team following.      Condition - Extremely Guarded  Family Communication  : None at bedside today, discussed with her cousin at bedside on 3/8.  Code Status :   Full  Consults  : Urology  PUD Prophylaxis : PPI   Procedures  :     Renal US - 1. Slightly atrophic right kidney with cortical scarring but no hydronephrosis. Mildly echogenic right renal cortex consistent with medical renal disease. 2. Normal left kidney  CT - 1. No acute localizing process in the abdomen or pelvis. 2. Stable left-sided colostomy. No bowel obstruction. 3. Stable sacrococcygeal decubitus ulcer with erosive changes of the tip of the coccyx. 4. Stable diffuse bladder wall thickening. Correlate clinically for cystitis. 5. Stable chronic appearing reticular opacities in both lung bases. 6. 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 7. Small hiatal hernia. 8. Aortic atherosclerosis. Aortic Atherosclerosis (ICD10-I70.0).      Disposition  Plan  :    Status is: Inpatient   DVT Prophylaxis  :    enoxaparin (LOVENOX) injection 30 mg Start: 01/01/24 1400    Lab Results  Component Value Date   PLT 199 01/07/2024    Diet :  Diet Order             Diet Heart Room service appropriate? Yes; Fluid consistency: Thin  Diet effective now                    Inpatient Medications  Scheduled Meds:  amLODipine  10 mg Oral Daily   enoxaparin (LOVENOX) injection  30 mg Subcutaneous Q24H   feeding supplement  237 mL Oral TID BM   hydrALAZINE  100 mg Oral Q8H   pantoprazole  40 mg Oral Daily   QUEtiapine  50 mg Oral BID   sertraline  50 mg Oral Daily   umeclidinium-vilanterol  1 puff Inhalation Daily   Continuous Infusions:   PRN Meds:.acetaminophen, albuterol, diphenhydrAMINE, HYDROcodone-acetaminophen, ondansetron (ZOFRAN) IV, traMADol    Objective:   Vitals:   01/12/24 0549 01/12/24 0841 01/12/24 1143 01/12/24 1200  BP: (!) 107/57 118/64 111/62 120/62  Pulse:  84 82 89  Resp:  16    Temp:  98.7 F (37.1 C) 98.7 F (37.1 C)   TempSrc:  Oral Oral   SpO2:  96% 94% 96%  Weight:      Height:        Wt Readings from Last 3 Encounters:  01/07/24 51.4 kg  12/16/23 49.2 kg  11/16/23 47.6 kg     Intake/Output Summary (Last 24 hours) at 01/12/2024 1330 Last data filed at 01/12/2024 1148 Gross per 24 hour  Intake 240 ml  Output 1000 ml  Net -760 ml     Physical Exam  Awake Alert, Oriented X 3,   CTAB RRR +ve B.Sounds, Abd Soft, colostomy present No Cyanosis, Clubbing or edema       RN pressure injury documentation: Pressure Injury 02/03/22 Sacrum Stage 4 - Full thickness tissue loss with exposed bone, tendon or muscle. (Active)  02/03/22  Location: Sacrum  Location Orientation:   Staging: Stage 4 - Full thickness tissue loss with exposed bone, tendon or muscle.  Wound Description (Comments):   Present on Admission: Yes     Pressure Injury 03/06/22 Sacrum Stage 4 - Full thickness  tissue loss with exposed bone, tendon or muscle. (Active)  03/06/22 1900  Location: Sacrum  Location Orientation:   Staging: Stage 4 - Full thickness tissue loss with exposed bone, tendon or muscle.  Wound Description (Comments):   Present on Admission: Yes     Pressure Injury 04/09/22 Buttocks Right Stage 2 -  Partial thickness loss of dermis presenting as a shallow open injury with a red, pink wound bed without slough. pink, red (Active)  04/09/22 0415  Location: Buttocks  Location Orientation: Right  Staging: Stage 2 -  Partial thickness loss of dermis presenting as a shallow open injury with a red, pink wound bed without slough.  Wound Description (Comments): pink, red  Present on Admission: Yes     Pressure Injury 04/09/22 Sacrum Upper Stage 3 -  Full thickness tissue loss. Subcutaneous fat may be visible but bone, tendon or muscle are NOT exposed. (Active)  04/09/22 0415  Location: Sacrum  Location Orientation: Upper  Staging: Stage 3 -  Full thickness tissue loss. Subcutaneous fat may be visible but bone, tendon or muscle are NOT exposed.  Wound Description (Comments):   Present on Admission: No     Pressure Injury 04/13/22 Buttocks Right Deep Tissue Pressure Injury - Purple or maroon localized area of discolored intact skin or blood-filled blister due to damage of underlying soft tissue from pressure and/or shear. (Active)  04/13/22 2042  Location: Buttocks  Location Orientation: Right  Staging: Deep Tissue Pressure Injury - Purple or maroon localized area of discolored intact skin or blood-filled blister due to damage of underlying soft tissue from pressure and/or shear.  Wound Description (Comments):   Present on Admission: No     Pressure Injury 04/13/22 Toe (Comment  which one) Anterior;Right Deep Tissue Pressure Injury - Purple or maroon localized area of discolored intact skin or blood-filled blister due to damage of underlying soft tissue from pressure and/or shear.  (Active)  04/13/22 2042  Location: Toe (Comment  which one)  Location Orientation: Anterior;Right  Staging: Deep Tissue Pressure Injury - Purple or maroon localized area of discolored intact skin or blood-filled blister due to damage of underlying soft tissue from pressure and/or shear.  Wound Description (Comments):   Present on Admission:      Pressure Injury 06/11/22 Buttocks Left Stage 2 -  Partial thickness loss of dermis presenting as a shallow open injury with a red, pink wound bed without slough. (Active)  06/11/22 1257  Location: Buttocks  Location Orientation: Left  Staging: Stage 2 -  Partial thickness loss of dermis presenting as a shallow open injury with a red, pink wound bed without slough.  Wound Description (Comments):   Present on Admission: No     Pressure Injury 06/11/22 Buttocks Right Stage 2 -  Partial thickness loss of dermis presenting as a shallow open injury with a red, pink wound bed without slough. (Active)  06/11/22 1258  Location: Buttocks  Location Orientation: Right  Staging: Stage 2 -  Partial thickness loss of dermis presenting as a shallow open injury with a red, pink wound bed without slough.  Wound Description (Comments):   Present on Admission:      Pressure Injury 10/26/22 Coccyx Mid Stage 4 - Full thickness tissue  loss with exposed bone, tendon or muscle. (Active)  10/26/22 1430  Location: Coccyx  Location Orientation: Mid  Staging: Stage 4 - Full thickness tissue loss with exposed bone, tendon or muscle.  Wound Description (Comments):   Present on Admission: Yes     Pressure Injury 10/26/22 Knee Anterior;Left Deep Tissue Pressure Injury - Purple or maroon localized area of discolored intact skin or blood-filled blister due to damage of underlying soft tissue from pressure and/or shear. (Active)  10/26/22 1330  Location: Knee  Location Orientation: Anterior;Left  Staging: Deep Tissue Pressure Injury - Purple or maroon localized area of  discolored intact skin or blood-filled blister due to damage of underlying soft tissue from pressure and/or shear.  Wound Description (Comments):   Present on Admission: Yes     Pressure Injury 10/28/22 Elbow Posterior;Right Stage 1 -  Intact skin with non-blanchable redness of a localized area usually over a bony prominence. (Active)  10/28/22 1235  Location: Elbow  Location Orientation: Posterior;Right  Staging: Stage 1 -  Intact skin with non-blanchable redness of a localized area usually over a bony prominence.  Wound Description (Comments):   Present on Admission: Yes     Pressure Injury 10/28/22 Elbow Left;Posterior Stage 1 -  Intact skin with non-blanchable redness of a localized area usually over a bony prominence. (Active)  10/28/22 1235  Location: Elbow  Location Orientation: Left;Posterior  Staging: Stage 1 -  Intact skin with non-blanchable redness of a localized area usually over a bony prominence.  Wound Description (Comments):   Present on Admission:      Pressure Injury 01/01/24 Coccyx Mid Deep Open Red Wound (Active)  01/01/24 1800  Location: Coccyx  Location Orientation: Mid  Staging:   Wound Description (Comments): Deep Open Red Wound  Present on Admission: Yes  Dressing Type Foam - Lift dressing to assess site every shift 01/12/24 0800      Data Review:    Recent Labs  Lab 01/06/24 0523 01/07/24 0559  WBC 9.6 10.5  HGB 10.6* 10.4*  HCT 31.5* 31.7*  PLT 192 199  MCV 83.6 83.6  MCH 28.1 27.4  MCHC 33.7 32.8  RDW 18.0* 17.9*  LYMPHSABS 1.4 1.9  MONOABS 0.6 0.9  EOSABS 3.0* 2.8*  BASOSABS 0.1 0.1    Recent Labs  Lab 01/06/24 0523 01/07/24 0559 01/10/24 1356  NA 134* 134* 133*  K 4.6 4.6 4.4  CL 101 103 102  CO2 20* 21* 20*  ANIONGAP 13 10 11   GLUCOSE 114* 90 88  BUN 49* 56* 52*  CREATININE 1.83* 2.00* 1.58*  CRP 1.2*  --   --   PROCALCITON 0.18 0.11  --   BNP 114.0*  --   --   MG 2.0  --   --   CALCIUM 8.5* 8.5* 8.9      Recent  Labs  Lab 01/06/24 0523 01/07/24 0559 01/10/24 1356  CRP 1.2*  --   --   PROCALCITON 0.18 0.11  --   BNP 114.0*  --   --   MG 2.0  --   --   CALCIUM 8.5* 8.5* 8.9    --------------------------------------------------------------------------------------------------------------- Lab Results  Component Value Date   TRIG 176 (H) 11/03/2022    Lab Results  Component Value Date   HGBA1C 5.0 02/06/2022   No results for input(s): "TSH", "T4TOTAL", "FREET4", "T3FREE", "THYROIDAB" in the last 72 hours.  No results for input(s): "VITAMINB12", "FOLATE", "FERRITIN", "TIBC", "IRON", "RETICCTPCT" in the last 72 hours. ------------------------------------------------------------------------------------------------------------------ Cardiac Enzymes  No results for input(s): "CKMB", "TROPONINI", "MYOGLOBIN" in the last 168 hours.  Invalid input(s): "CK"  Micro Results Recent Results (from the past 240 hours)  MRSA Next Gen by PCR, Nasal     Status: None   Collection Time: 01/07/24  8:34 PM   Specimen: Nasal Mucosa; Nasal Swab  Result Value Ref Range Status   MRSA by PCR Next Gen NOT DETECTED NOT DETECTED Final    Comment: (NOTE) The GeneXpert MRSA Assay (FDA approved for NASAL specimens only), is one component of a comprehensive MRSA colonization surveillance program. It is not intended to diagnose MRSA infection nor to guide or monitor treatment for MRSA infections. Test performance is not FDA approved in patients less than 33 years old. Performed at [facility], [address] [place], [place] [address]     Radiology Report No results found.     Signature  -   Huey Bienenstock M.D on 01/12/2024 at 1:30 PM   -  To page go to [email or web address]

## 2024-01-12 NOTE — Plan of Care (Signed)
  Problem: Education: Goal: Knowledge of General Education information will improve Description: Including pain rating scale, medication(s)/side effects and non-pharmacologic comfort measures Outcome: Adequate for Discharge   Problem: Health Behavior/Discharge Planning: Goal: Ability to manage health-related needs will improve Outcome: Adequate for Discharge   Problem: Clinical Measurements: Goal: Ability to maintain clinical measurements within normal limits will improve Outcome: Adequate for Discharge Goal: Will remain free from infection Outcome: Adequate for Discharge Goal: Diagnostic test results will improve Outcome: Adequate for Discharge Goal: Respiratory complications will improve Outcome: Adequate for Discharge Goal: Cardiovascular complication will be avoided Outcome: Adequate for Discharge   Problem: Activity: Goal: Risk for activity intolerance will decrease Outcome: Adequate for Discharge   Problem: Nutrition: Goal: Adequate nutrition will be maintained Outcome: Adequate for Discharge   Problem: Coping: Goal: Level of anxiety will decrease Outcome: Adequate for Discharge   Problem: Elimination: Goal: Will not experience complications related to bowel motility Outcome: Adequate for Discharge Goal: Will not experience complications related to urinary retention Outcome: Adequate for Discharge   Problem: Pain Managment: Goal: General experience of comfort will improve and/or be controlled Outcome: Adequate for Discharge   Problem: Safety: Goal: Ability to remain free from injury will improve Outcome: Adequate for Discharge   Problem: Skin Integrity: Goal: Risk for impaired skin integrity will decrease Outcome: Adequate for Discharge   Problem: Acute Rehab PT Goals(only PT should resolve) Goal: Patient Will Transfer Sit To/From Stand Outcome: Adequate for Discharge Goal: Pt Will Transfer Bed To Chair/Chair To Bed Outcome: Adequate for Discharge    Problem: Acute Rehab OT Goals (only OT should resolve) Goal: Pt. Will Perform Lower Body Bathing Outcome: Adequate for Discharge Goal: Pt. Will Perform Lower Body Dressing Outcome: Adequate for Discharge Goal: Pt. Will Transfer To Toilet Outcome: Adequate for Discharge Goal: Pt. Will Perform Toileting-Clothing Manipulation Outcome: Adequate for Discharge Goal: Pt/Caregiver Will Perform Home Exercise Program Outcome: Adequate for Discharge

## 2024-01-12 NOTE — Progress Notes (Signed)
 Occupational Therapy Treatment Patient Details Name: Sherry Baker MRN: [id number] DOB: Jul 19, 1966 Today's Date: 01/12/2024   History of present illness 58 year old woman who presented 2/27 with nausea, vomiting. Dx: AKI, UA concerning for UTI.  Noted that recent urine cultures grew ESBL Klebsiella. PMH of rectal cancer s/p surgery with colostomy, HTN, GERD, depression, COPD, recurrent UTIs.   OT comments  Pt is making continued progress towards acute OT goals. Pt continues to be limited by deficits listed below. Pt required verbal encouragement to engage in session due to reports of nausea from medication received earlier in the day. Overall, pt required up to [facility] for functional mobility tasks requiring verbal cues for safe management of RW when performing functional transfer to bed. Pt HR increased to 145 BPM with functional mobility but decreased quickly to 109 at rest and PLB. OT to continue following pt acutely to address functional needs with discharge recommendations of follow-up OT services <3hrs/day to maximize functional independence.       If plan is discharge home, recommend the following:  A little help with walking and/or transfers;A little help with bathing/dressing/bathroom;Assist for transportation;Help with stairs or ramp for entrance   Equipment Recommendations  BSC/3in1;Other (comment)    Recommendations for Other Services      Precautions / Restrictions Precautions Precautions: Fall Recall of Precautions/Restrictions: Intact Precaution/Restrictions Comments: monitor O2, pulse rate Restrictions Weight Bearing Restrictions Per Provider Order: No       Mobility Bed Mobility Overal bed mobility: Needs Assistance Bed Mobility: Supine to Sit, Sit to Supine     Supine to sit: Supervision, HOB elevated Sit to supine: HOB elevated, Supervision   General bed mobility comments: Pt completed bed mobility without physical assistance. Supervision provided for safety     Transfers Overall transfer level: Needs assistance Equipment used: Rolling walker (2 wheels) Transfers: Sit to/from Stand Sit to Stand: Supervision           General transfer comment: Supervision provided for safety     Balance Overall balance assessment: Needs assistance Sitting-balance support: Bilateral upper extremity supported, Feet supported Sitting balance-Leahy Scale: Good Sitting balance - Comments: static sitting EOB   Standing balance support: During functional activity, Bilateral upper extremity supported Standing balance-Leahy Scale: Fair Standing balance comment: CGA provided for safety when performing functional ambulation in hallway                           ADL either performed or assessed with clinical judgement   ADL Overall ADL's : Needs assistance/impaired                                     Functional mobility during ADLs: Contact guard assist;Rolling walker (2 wheels);Cueing for sequencing General ADL Comments: Pt reported nausea on this date. Pt completed functional mobility with CGA for safety when ambulating in hallway and verbal cues provided for sequencing when pivoting towards EOB using RW.    Extremity/Trunk Assessment Upper Extremity Assessment Upper Extremity Assessment: Overall WFL for tasks assessed   Lower Extremity Assessment Lower Extremity Assessment: Defer to PT evaluation        Vision   Vision Assessment?: Wears glasses for reading   Perception Perception Perception: Not tested   Praxis Praxis Praxis: Not tested   Communication Communication Communication: No apparent difficulties   Cognition Arousal: Alert Behavior During Therapy: [facility] for tasks assessed/performed Cognition: No  apparent impairments             OT - Cognition Comments: Pt followed verbal commands, alert throughout session, MIN verbal cues provided for RW safety when performing functional transfer                  Following commands: Intact        Cueing   Cueing Techniques: Verbal cues  Exercises      Shoulder Instructions       General Comments HR increased to 145 with mobility; 109 at rest at the end of therapy session    Pertinent Vitals/ Pain       Pain Assessment Pain Assessment: No/denies pain (reported nausea on this date)  Home Living                                          Prior Functioning/Environment              Frequency  Min 2X/week        Progress Toward Goals  OT Goals(current goals can now be found in the care plan section)  Progress towards OT goals: Progressing toward goals  Acute Rehab OT Goals Patient Stated Goal: to rest OT Goal Formulation: With patient Time For Goal Achievement: 01/18/24 Potential to Achieve Goals: Good ADL Goals Pt Will Perform Lower Body Bathing: with modified independence;sitting/lateral leans;sit to/from stand Pt Will Perform Lower Body Dressing: with modified independence;sit to/from stand;sitting/lateral leans Pt Will Transfer to Toilet: with modified independence;ambulating;regular height toilet Pt Will Perform Toileting - Clothing Manipulation and hygiene: with modified independence;sitting/lateral leans;sit to/from stand Pt/caregiver will Perform Home Exercise Program: Increased strength;Both right and left upper extremity;With written HEP provided;Independently  Plan      Co-evaluation                 AM-PAC OT "6 Clicks" Daily Activity     Outcome Measure   Help from another person eating meals?: A Little Help from another person taking care of personal grooming?: A Little Help from another person toileting, which includes using toliet, bedpan, or urinal?: A Little Help from another person bathing (including washing, rinsing, drying)?: A Little Help from another person to put on and taking off regular upper body clothing?: A Little Help from another person to put on and taking off  regular lower body clothing?: A Little 6 Click Score: 18    End of Session Equipment Utilized During Treatment: Gait belt;Rolling walker (2 wheels)  OT Visit Diagnosis: Unsteadiness on feet (R26.81);Other abnormalities of gait and mobility (R26.89);Muscle weakness (generalized) (M62.81);Pain   Activity Tolerance Patient tolerated treatment well   Patient Left in bed;with call bell/phone within reach;with bed alarm set   Nurse Communication Mobility status        Time: 1610-9604 OT Time Calculation (min): 12 min  Charges: OT General Charges $OT Visit: 1 Visit OT Treatments $Therapeutic Activity: 8-22 mins  Kevan Ny, Darliss Cheney 01/12/2024, 1:45 PM

## 2024-02-03 ENCOUNTER — Telehealth: Payer: Self-pay

## 2024-02-03 NOTE — Transitions of Care (Post Inpatient/ED Visit) (Unsigned)
   02/03/2024  Name: Sherry Baker MRN: [id number] DOB: 1966/01/20  Today's TOC FU Call Status: Today's TOC FU Call Status:: Unsuccessful Call (1st Attempt) Unsuccessful Call (1st Attempt) Date: 02/03/24  Attempted to reach the patient regarding the most recent Inpatient/ED visit.  Follow Up Plan: Additional outreach attempts will be made to reach the patient to complete the Transitions of Care (Post Inpatient/ED visit) call.   Signature Karena Addison, LPN [facility] Nurse Health Advisor Direct Dial [phone]

## 2024-02-04 ENCOUNTER — Inpatient Hospital Stay: Payer: MEDICAID | Admitting: Adult Health

## 2024-02-04 NOTE — Transitions of Care (Post Inpatient/ED Visit) (Signed)
 02/04/2024  Name: Sherry Baker MRN: [id number] DOB: 1966/10/13  Today's TOC FU Call Status: Today's TOC FU Call Status:: Successful TOC FU Call Completed Unsuccessful Call (1st Attempt) Date: 02/03/24 [facility] FU Call Complete Date: 02/04/24 Patient's Name and Date of Birth confirmed.  Transition Care Management Follow-up Telephone Call Date of Discharge: 02/02/24 Discharge Facility: Other ([facility]) Name of Other ([facility]) Discharge Facility: [facility] Type of Discharge: Inpatient Admission Primary Inpatient Discharge Diagnosis:: wound How have you been since you were released from the hospital?: Better Any questions or concerns?: No  Items Reviewed: Did you receive and understand the discharge instructions provided?: Yes Medications obtained,verified, and reconciled?: Yes (Medications Reviewed) Any new allergies since your discharge?: No Dietary orders reviewed?: Yes Do you have support at home?: No  Medications Reviewed Today: Medications Reviewed Today     Reviewed by Karena Addison, LPN (Licensed Practical Nurse) on 02/04/24 at 1500  Med List Status: <None>   Medication Order Taking? Sig Documenting Provider Last Dose Status Informant  albuterol (VENTOLIN HFA) 108 (90 Base) MCG/ACT inhaler [id number] No Inhale 2 puffs into the lungs every 6 (six) hours as needed for wheezing or shortness of breath. Claiborne Rigg, NP 12/31/2023 Morning Active Self  amLODipine (NORVASC) 10 MG tablet [id number] No Take 1 tablet (10 mg total) by mouth daily. Claiborne Rigg, NP 12/31/2023 Morning Active Self  feeding supplement (ENSURE ENLIVE / ENSURE PLUS) LIQD [id number] No Take 237 mLs by mouth 3 (three) times daily between meals. Claiborne Rigg, NP 12/30/2023 Active Self  hydrALAZINE (APRESOLINE) 100 MG tablet [id number]  Take 1 tablet (100 mg total) by mouth every 8 (eight) hours. Elgergawy, Leana Roe, MD  Active   HYDROcodone-acetaminophen (NORCO/VICODIN) 5-325 MG tablet [id number]   Take 1 tablet by mouth every 6 (six) hours as needed for severe pain (pain score 7-10). Elgergawy, Leana Roe, MD  Active   ondansetron (ZOFRAN) 4 MG tablet [id number] No Take 1 tablet (4 mg total) by mouth every 8 (eight) hours as needed for nausea or vomiting. Claiborne Rigg, NP 12/31/2023 Morning Active Self  pantoprazole (PROTONIX) 40 MG tablet [id number] No Take 1 tablet (40 mg total) by mouth daily. Claiborne Rigg, NP 12/31/2023 Morning Active Self  QUEtiapine (SEROQUEL) 50 MG tablet [id number] No Take 1 tablet (50 mg total) by mouth 2 (two) times daily. Claiborne Rigg, NP 12/31/2023 Morning Active Self  sertraline (ZOLOFT) 50 MG tablet [id number] No Take 1 tablet (50 mg total) by mouth daily. Claiborne Rigg, NP 12/31/2023 Morning Active Self  sodium bicarbonate 650 MG tablet [id number] No Take 1 tablet (650 mg total) by mouth daily.  Patient not taking: Reported on 01/01/2024   Storm Frisk, MD Not Taking Active Self  umeclidinium-vilanterol [facility] ELLIPTA) 62.5-25 MCG/ACT AEPB [id number] No Inhale 1 puff into the lungs daily.  Patient taking differently: Inhale 1 puff into the lungs daily as needed (for shortness of breath).   Storm Frisk, MD Past Month Active Self  Discontinued 01/09/12 2103           Home Care and Equipment/Supplies: Were Home Health Services Ordered?: No Any new equipment or medical supplies ordered?: Yes Name of Medical supply agency?: unknown Were you able to get the equipment/medical supplies?: No Do you have any questions related to the use of the equipment/supplies?: No  Functional Questionnaire: Do you need assistance with bathing/showering or dressing?: No Do you need assistance with meal preparation?: No Do you need assistance with eating?:  No Do you have difficulty maintaining continence: No Do you need assistance with getting out of bed/getting out of a chair/moving?: No Do you have difficulty managing or taking your medications?:  No  Follow up appointments reviewed: PCP Follow-up appointment confirmed?: Yes Date of PCP follow-up appointment?: 02/17/24 Follow-up Provider: [facility] Follow-up appointment confirmed?: No Reason Specialist Follow-Up Not Confirmed: Patient has Specialist Provider Number and will Call for Appointment Do you need transportation to your follow-up appointment?: No Do you understand care options if your condition(s) worsen?: Yes-patient verbalized understanding    SIGNATURE Karena Addison, LPN [facility] Nurse Health Advisor Direct Dial [phone]

## 2024-02-04 NOTE — Telephone Encounter (Signed)
 Please cancel appointment with me and schedule her with another provider if she wants to continue care at the community clinic. Her next of kin Crystal stated they were going to find another provider at another clinic but somehow she has been scheduled with me. A letter of dismissal from my care was also sent last month to her mychart. Please also print and send to her home address if it has not been sent already and note in chart when it has been mailed. Thank you.

## 2024-02-08 ENCOUNTER — Other Ambulatory Visit: Payer: Self-pay

## 2024-02-08 NOTE — Telephone Encounter (Signed)
 Unable to reach Ms. Hilgers and Crystal to inform them of this mistake. I was able to reschedule that appointment and left a detailed message about new appointment and why it was rescheduled.  Letter has been sent to address on file.

## 2024-02-17 ENCOUNTER — Inpatient Hospital Stay: Payer: MEDICAID | Admitting: Nurse Practitioner

## 2024-02-24 ENCOUNTER — Ambulatory Visit: Payer: MEDICAID | Attending: Physician Assistant | Admitting: Physician Assistant

## 2024-02-24 VITALS — BP 115/72 | HR 102 | Temp 98.3°F | Ht 62.0 in | Wt 104.0 lb

## 2024-02-24 DIAGNOSIS — Z09 Encounter for follow-up examination after completed treatment for conditions other than malignant neoplasm: Secondary | ICD-10-CM

## 2024-02-24 DIAGNOSIS — Z932 Ileostomy status: Secondary | ICD-10-CM | POA: Diagnosis not present

## 2024-02-24 DIAGNOSIS — N322 Vesical fistula, not elsewhere classified: Secondary | ICD-10-CM

## 2024-02-24 DIAGNOSIS — E43 Unspecified severe protein-calorie malnutrition: Secondary | ICD-10-CM

## 2024-02-24 DIAGNOSIS — G8929 Other chronic pain: Secondary | ICD-10-CM

## 2024-02-24 DIAGNOSIS — K219 Gastro-esophageal reflux disease without esophagitis: Secondary | ICD-10-CM | POA: Diagnosis not present

## 2024-02-24 DIAGNOSIS — I10 Essential (primary) hypertension: Secondary | ICD-10-CM | POA: Diagnosis not present

## 2024-02-24 DIAGNOSIS — L89154 Pressure ulcer of sacral region, stage 4: Secondary | ICD-10-CM | POA: Diagnosis not present

## 2024-02-24 MED ORDER — PANTOPRAZOLE SODIUM 40 MG PO TBEC: 40.0000 mg | DELAYED_RELEASE_TABLET | Freq: Every day | ORAL | 1 refills | Status: AC

## 2024-02-24 MED ORDER — HYDRALAZINE HCL 100 MG PO TABS: 100.0000 mg | ORAL_TABLET | Freq: Three times a day (TID) | ORAL | 1 refills | Status: DC

## 2024-02-24 MED ORDER — ENSURE ENLIVE PO LIQD: 237.0000 mL | Freq: Three times a day (TID) | ORAL | 6 refills | Status: DC

## 2024-02-24 MED ORDER — ONDANSETRON HCL 4 MG PO TABS: 4.0000 mg | ORAL_TABLET | Freq: Three times a day (TID) | ORAL | 0 refills | Status: AC | PRN

## 2024-02-24 MED ORDER — AMLODIPINE BESYLATE 10 MG PO TABS: 10.0000 mg | ORAL_TABLET | Freq: Every day | ORAL | 1 refills | Status: AC

## 2024-02-24 NOTE — Patient Instructions (Addendum)
 As we discussed, please scheule the appt th Urology at [facility].  Keep your upcoming appts for pulmonology.  I have placed referrals for pain management in [place] and for the wound care clinic in [place].    Drink 64 to 80 ounces water  daily.    [facility] [address], [place], [place] [address] [phone] or [phone] Walk-in urgent care 24/7 for anyone  For [facility] ONLY New patient assessments and therapy walk-ins: Monday and Wednesday 8am-11am First and second Friday 1pm-5pm New patient psychiatry and medication management walk-ins: Mondays, Wednesdays, Thursdays, Fridays 8am-11am No psychiatry walk-ins on Tuesday

## 2024-02-24 NOTE — Progress Notes (Signed)
 Patient ID: Sherry Baker, female   DOB: 1966/02/21, 58 y.o.   MRN: [id number]      Cozy Veale, is a 58 y.o. female  KGM:[id number]  UYQ:[id number]  DOB - 10-Jul-1966  Chief Complaint  Patient presents with   Hospitalization Follow-up    Hospitalization f/u.  Requesting med review - previously on pain med  Requesting ensure rx sent to med supply store       Subjective:   Sherry Baker is a 58 y.o. female here today for a follow up visit after being hospitalized 2.27-3/11 for [facility] UTI then at [facility] for inpatient PT.  Has family member with her.  Got out of inpatient rehab around April 11.  They did not bring any paperwork from that discharge but it was eventually faxed over during visit.    She is feeling good.  Sober and clean X 5 months.  Appetite is much improved.  Some nausea.    Needs pain management referral.  Says has chronic pain in legs and back from when she had rectal CA.    Pulmonology appt scheduled.  She has the infor to schedule urology appt at [facility] but has not scheduled yet.  Denies SI/HI.  Appetite and weight are improving.    No pain or redness around ostomy.  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 Nolon Baxter, Jolee Naval III  , please help to arrange for this appointment Patient to have routine follow-up with [place] as an outpatient regarding her pulmonary nodules, CT scan was done during the hospital, please help to arrange for this appointment    Brief/Interim Summary: 58 year old woman with PMH of rectal cancer s/p surgery with colostomy, HTN, GERD, depression, COPD, recurrent UTIs who presented with nausea, vomiting.  Patient reported that she had just finished a course of antibiotics.  Presented with AKI, UA concerning for UTI.  Noted that recent urine  cultures grew ESBL Klebsiella.  Patient was started on meropenem . She finished 7 days on meropenem .    AKI on CKD - Baseline creatinine is 1.4-1.5.,  Creatinine back to baseline. -Renal, due to nausea, vomiting and dehydration. - no acute findings on renal ultrasound   ESBL Klebsiella UTI Urine cultures growing ESBL Klebsiella -Treated 7 days on IV meropenem . -failed outpatient nitrofurantoin  treatment.   Abdominal pain. Vesiculo cutaneous fistula -Imaging on admission significant for cystitis, she had suprapubic tenderness, she is with known history of vesicular cutaneous fistula, with no severe retention bladder scan. -Urology input greatly appreciated, no further workup currently, to follow-up with [facility] as an outpatient.   History of rectal cancer s/p colostomy -Continue colostomy care.   Nighttime sinus bradycardia with a 2-second pause on 01/02/2024.   -Resolved after holding her high-dose Coreg  at home, continue to hold Coreg  on discharge - stable TSH, bradycardia has resolved.   HTN   - continue Norvasc , added hydralazine , stop Coreg  due to above   COPD -Continue home Anoro Ellipta .   Depression.   -No acute issues supportive care.   Pulmonary nodules and fibrosis -she is following with pulmonary as an outpatient, was due for repeat CT chest, obtained during hospital stay, right upper lobe nodules appear stable, new 5 mm right lower lobe, recommendation for 43-month follow-up. -Stable underlining interstitial septal thickening with some peripheral fibrosis -Patient was instructed to keep  her outpatient pulmonary follow-up.     Chronic sacral decubitus ulcer present on admission kindly see nursing notes.  Pressure injury to coccyx, deep.  Wound -Continue with wound care instruction as below.   Discharge Diagnoses:  Principal Problem:   ARF (acute renal failure) (HCC) Active Problems:   Alcoholic cirrhosis of liver without ascites (HCC)   Bipolar affective disorder  (HCC)   GERD (gastroesophageal reflux disease)   Pressure injury of skin   HTN (hypertension)   COPD with emphysema (HCC)   Pressure ulcer of sacral region, stage 4 (HCC)   Urinary tract infection without hematuria   Nausea & vomiting   Acute kidney injury (HCC) No problems updated.  ALLERGIES: Allergies  Allergen Reactions   Penicillins Hives, Swelling and Rash    Tolerated Unasyn , Rocephin   Did it involve swelling of the face/tongue/throat, SOB, or low BP? Yes  Did it involve sudden or severe rash/hives, skin peeling, or any reaction on the inside of your mouth or nose? No  Did you need to seek medical attention at a hospital or doctor's office? No  When did it last happen?  <10 years    If all above answers are "NO", may proceed with cephalosporin use.  Has patient had a PCN reaction causing immediate rash, facial/tongue/throat swelling, SOB or lightheadedness with hypotension: Yes, Has patient had a PCN reaction causing severe rash involving mucus membranes or skin necrosis: No, Has patient had a PCN reaction that required hospitalization No, Has patient had a PCN reaction occurring within the last 10 years: Yes, If all of the above answers are "NO", then may proceed with Cephalosporin use.    PAST MEDICAL HISTORY: Past Medical History:  Diagnosis Date   Acquired hypothyroidism    Alcohol abuse    Allergy    PCNS swelling   Arthritis    Bipolar 1 disorder (HCC)    Chronic kidney disease    Chronic pain syndrome    Cirrhosis of liver (HCC)    Cocaine abuse (HCC)    COVID-19 virus infection 06/23/2021   Delirium tremens (HCC) 06/22/2021   Depression    Hypertension    Rectal cancer (HCC) 01/21/2017   Suicidal ideation 02/11/2014    MEDICATIONS AT HOME: Prior to Admission medications   Medication Sig Start Date End Date Taking? Authorizing Provider  omeprazole  (PRILOSEC) 20 MG capsule Take 20 mg by mouth daily.   Yes [provider]  albuterol   (VENTOLIN  HFA) 108 (90 Base) MCG/ACT inhaler Inhale 2 puffs into the lungs every 6 (six) hours as needed for wheezing or shortness of breath. 12/16/23   Fleming, Zelda W, NP  amLODipine  (NORVASC ) 10 MG tablet Take 1 tablet (10 mg total) by mouth daily. 02/24/24   Hassie Lint, PA-C  feeding supplement (ENSURE ENLIVE / ENSURE PLUS) LIQD Take 237 mLs by mouth 3 (three) times daily between meals. 02/24/24   Hassie Lint, PA-C  hydrALAZINE  (APRESOLINE ) 100 MG tablet Take 1 tablet (100 mg total) by mouth every 8 (eight) hours. 02/24/24   Hassie Lint, PA-C  HYDROcodone -acetaminophen  (NORCO/VICODIN) 5-325 MG tablet Take 1 tablet by mouth every 6 (six) hours as needed for severe pain (pain score 7-10). 01/12/24   Elgergawy, Ardia Kraft, MD  ondansetron  (ZOFRAN ) 4 MG tablet Take 1 tablet (4 mg total) by mouth every 8 (eight) hours as needed for nausea or vomiting. 02/24/24   Hassie Lint, PA-C  pantoprazole  (PROTONIX ) 40 MG tablet Take 1 tablet (40 mg total)  by mouth daily. 02/24/24   Hassie Lint, PA-C  QUEtiapine  (SEROQUEL ) 50 MG tablet Take 1 tablet (50 mg total) by mouth 2 (two) times daily. 12/16/23   Fleming, Zelda W, NP  sertraline  (ZOLOFT ) 50 MG tablet Take 1 tablet (50 mg total) by mouth daily. 12/16/23   Fleming, Zelda W, NP  sodium bicarbonate  650 MG tablet Take 1 tablet (650 mg total) by mouth daily. Patient not taking: Reported on 01/01/2024 02/10/23   Vernell Goldsmith, MD  umeclidinium-vilanterol (ANORO ELLIPTA ) 62.5-25 MCG/ACT AEPB Inhale 1 puff into the lungs daily. Patient taking differently: Inhale 1 puff into the lungs daily as needed (for shortness of breath). 02/10/23   Vernell Goldsmith, MD  ziprasidone  (GEODON ) 40 MG capsule Take 1 capsule (40 mg total) by mouth 2 (two) times daily with a meal. 11/06/11 01/09/12  Carlton Chick, MD    ROS: Neg HEENT Neg resp Neg cardiac Neg GI Neg MS Neg neuro  Objective:   Vitals:   02/24/24 1433  BP: 115/72  Pulse: (!) 102  Temp:  98.3 F (36.8 C)  TempSrc: Oral  SpO2: 96%  Weight: 104 lb (47.2 kg)  Height: 5\' 2"  (1.575 m)   Exam General appearance : Awake, alert, not in any distress. Speech Clear. Not toxic looking;  using walker.  Appears older than stated age.   HEENT: Atraumatic and Normocephalic Neck: Supple, no JVD. No cervical lymphadenopathy.  Chest: Good air entry bilaterally, CTAB.  No rales/rhonchi/wheezing CVS: S1 S2 regular, no murmurs.  Abdomen: Bowel sounds present, Non tender and not distended with no gaurding, rigidity or rebound. Ostomy site clean and dry Extremities: B/L Lower Ext shows no edema, both legs are warm to touch Neurology: Awake alert, and oriented X 3, CN II-XII intact, Non focal Skin: No Rash  Data Review Lab Results  Component Value Date   HGBA1C 5.0 02/06/2022   HGBA1C 5.2 10/29/2021    Assessment & Plan   1. GERD without esophagitis - pantoprazole  (PROTONIX ) 40 MG tablet; Take 1 tablet (40 mg total) by mouth daily.  Dispense: 90 tablet; Refill: 1 - ondansetron  (ZOFRAN ) 4 MG tablet; Take 1 tablet (4 mg total) by mouth every 8 (eight) hours as needed for nausea or vomiting.  Dispense: 20 tablet; Refill: 0  2. Pressure ulcer of sacral region, stage 4 (HCC) (Primary) - Ambulatory referral to Wound Clinic  3. Protein-calorie malnutrition, severe (HCC) Improving-she could supplement for adequate protein 1 to 2 daily.   - feeding supplement (ENSURE ENLIVE / ENSURE PLUS) LIQD; Take 237 mLs by mouth 3 (three) times daily between meals.  Dispense: 237 mL; Refill: 6 - ondansetron  (ZOFRAN ) 4 MG tablet; Take 1 tablet (4 mg total) by mouth every 8 (eight) hours as needed for nausea or vomiting.  Dispense: 20 tablet; Refill: 0  4. S/P ileostomy (HCC) Ostomy in place and stable  5. Hospital discharge follow-up  6. Other chronic pain - Ambulatory referral to Pain Clinic  7. Vesical fistula - CBC with Differential - Basic Metabolic Panel Make appt with urology at [facility].   They have information  8. Hypertension, unspecified type Controlled currently - amLODipine  (NORVASC ) 10 MG tablet; Take 1 tablet (10 mg total) by mouth daily.  Dispense: 90 tablet; Refill: 1 - hydrALAZINE  (APRESOLINE ) 100 MG tablet; Take 1 tablet (100 mg total) by mouth every 8 (eight) hours.  Dispense: 90 tablet; Refill: 1    Return in about 4 weeks (around 03/23/2024) for [facility] and assign new PCP.  The patient was given clear instructions to go to ER or return to medical center if symptoms don't improve, worsen or new problems develop. The patient verbalized understanding. The patient was told to call to get lab results if they haven't heard anything in the next week.      Dulce Gibbs, PA-C [facility] and [facility] [place], [place] [phone]   02/24/2024, 3:10 PM

## 2024-02-25 ENCOUNTER — Encounter: Payer: Self-pay | Admitting: Physician Assistant

## 2024-02-25 LAB — CBC WITH DIFFERENTIAL/PLATELET
Basophils Absolute: 0 10*3/uL (ref 0.0–0.2)
Basos: 0 %
EOS (ABSOLUTE): 0.3 10*3/uL (ref 0.0–0.4)
Eos: 4 %
Hematocrit: 39.5 % (ref 34.0–46.6)
Hemoglobin: 12.4 g/dL (ref 11.1–15.9)
Immature Grans (Abs): 0.1 10*3/uL (ref 0.0–0.1)
Immature Granulocytes: 1 %
Lymphocytes Absolute: 1.9 10*3/uL (ref 0.7–3.1)
Lymphs: 24 %
MCH: 26.5 pg — ABNORMAL LOW (ref 26.6–33.0)
MCHC: 31.4 g/dL — ABNORMAL LOW (ref 31.5–35.7)
MCV: 84 fL (ref 79–97)
Monocytes Absolute: 0.7 10*3/uL (ref 0.1–0.9)
Monocytes: 9 %
Neutrophils Absolute: 4.9 10*3/uL (ref 1.4–7.0)
Neutrophils: 62 %
Platelets: 290 10*3/uL (ref 150–450)
RBC: 4.68 x10E6/uL (ref 3.77–5.28)
RDW: 15 % (ref 11.7–15.4)
WBC: 7.9 10*3/uL (ref 3.4–10.8)

## 2024-02-25 LAB — BASIC METABOLIC PANEL WITH GFR
BUN/Creatinine Ratio: 11 (ref 9–23)
BUN: 23 mg/dL (ref 6–24)
CO2: 17 mmol/L — ABNORMAL LOW (ref 20–29)
Calcium: 9.3 mg/dL (ref 8.7–10.2)
Chloride: 105 mmol/L (ref 96–106)
Creatinine, Ser: 2.01 mg/dL — ABNORMAL HIGH (ref 0.57–1.00)
Glucose: 95 mg/dL (ref 70–99)
Potassium: 4.5 mmol/L (ref 3.5–5.2)
Sodium: 140 mmol/L (ref 134–144)
eGFR: 29 mL/min/{1.73_m2} — ABNORMAL LOW (ref >59)

## 2024-03-01 ENCOUNTER — Ambulatory Visit: Payer: Self-pay

## 2024-03-01 NOTE — Telephone Encounter (Signed)
 [facility] [id number]- CALLED TO CHECK STATUS OF INCONTINENCE SUPPLIES FOR MEMBER BACK ON 02/23/24.Aaron AasAaron AasADVISED SHE WILL SEND FAX REQUEST OVER AGAIN   Copied from CRM [id number]. Topic: General - Other >> Mar 01, 2024  9:52 AM Zina Hilts wrote: Reason for CRM: [facility] [id number]- CALLED TO CHECK STATUS OF INCONTINENCE SUPPLIES FOR MEMBER BACK ON 02/23/24.Aaron AasAaron AasADVISED SHE WILL SEND FAX REQUEST OVER AGAIN

## 2024-03-01 NOTE — Telephone Encounter (Signed)
 Not in office to see any faxed paperwork for patient

## 2024-03-03 NOTE — Telephone Encounter (Signed)
 I have not located them either.

## 2024-03-07 ENCOUNTER — Telehealth: Payer: Self-pay | Admitting: Critical Care Medicine

## 2024-03-07 ENCOUNTER — Ambulatory Visit: Payer: MEDICAID | Admitting: Primary Care

## 2024-03-07 ENCOUNTER — Encounter: Payer: Self-pay | Admitting: Primary Care

## 2024-03-07 VITALS — BP 102/59 | HR 89 | Temp 97.8°F | Ht 62.0 in | Wt 109.2 lb

## 2024-03-07 DIAGNOSIS — J439 Emphysema, unspecified: Secondary | ICD-10-CM

## 2024-03-07 DIAGNOSIS — R918 Other nonspecific abnormal finding of lung field: Secondary | ICD-10-CM

## 2024-03-07 DIAGNOSIS — F3189 Other bipolar disorder: Secondary | ICD-10-CM

## 2024-03-07 DIAGNOSIS — F17210 Nicotine dependence, cigarettes, uncomplicated: Secondary | ICD-10-CM | POA: Diagnosis not present

## 2024-03-07 DIAGNOSIS — Z720 Tobacco use: Secondary | ICD-10-CM

## 2024-03-07 NOTE — Patient Instructions (Addendum)
 -PULMONARY NODULES: Pulmonary nodules are small growths in the lungs. Your recent CT scan showed that the nodule in your right upper lung has decreased in size, but a new small area has appeared. We will do a high-resolution CT scan in September to get a better look at these nodules and check for any fibrosis.  -NICOTINE  DEPENDENCE: Nicotine  dependence means you are addicted to nicotine , usually from smoking. You have been smoking since you were [age] years old and currently smoke about seven cigarettes a day. This puts you at high risk for lung cancer and fibrosis. We recommend you try to cut back by one cigarette per week and use nicotine  gum as a replacement. We also discussed other resources like hypnosis, acupuncture, and quitline services. If you consider medications like Chantix or Wellbutrin to help you quit, we need to monitor your mood closely due to your bipolar disorder.  -DEPRESSION: You are currently taking Zoloft  and Seroquel  to manage this condition. If you decide to use Chantix or Wellbutrin to help quit smoking, we will need to monitor your mood closely as these medications can cause mood disturbances.  -HISTORY OF RECTAL CANCER WITH COLOSTOMY: You have a history of rectal cancer and have had a colostomy, which is a surgical procedure that creates an opening for the colon through the abdomen.  INSTRUCTIONS: Please schedule a high-resolution CT scan in September to evaluate your lung nodules and check for fibrosis. Try to cut back on smoking by one cigarette per week and consider using nicotine  gum as a replacement. If you are interested in other smoking cessation methods like hypnosis, acupuncture, or quitline services, let us  know. Continue taking your current medications, Zoloft  and Seroquel , and monitor your mood if you decide to use Chantix or Wellbutrin for smoking cessation.  Follow-up Please schedule follow-up in September with 1 hour PFT prior - Dr. Baldwin Levee (30 min slot- new  patient)   You can receive free nicotine  replacement therapy ( patches, gum or mints) by calling 1-800-QUIT NOW. Please call so we can get you on the path to becoming  a non-smoker. I know it is hard, but you can do this!  Other options for assistance in smoking cessation ( As covered by your insurance benefits)  Hypnosis for smoking cessation  [employer] [phone]  Acupuncture for smoking cessation  [employer] [phone]     Managing the Challenge of Quitting Smoking Quitting smoking is a physical and mental challenge. You may have cravings, withdrawal symptoms, and temptation to smoke. Before quitting, work with your health care provider to make a plan that can help you manage quitting. Making a plan before you quit may keep you from smoking when you have the urge to smoke while trying to quit. How to manage lifestyle changes Managing stress Stress can make you want to smoke, and wanting to smoke may cause stress. It is important to find ways to manage your stress. You could try some of the following: Practice relaxation techniques. Breathe slowly and deeply, in through your nose and out through your mouth. Listen to music. Soak in a bath or take a shower. Imagine a peaceful place or vacation. Get some support. Talk with family or friends about your stress. Join a support group. Talk with a counselor or therapist. Get some physical activity. Go for a walk, run, or bike ride. Play a favorite sport. Practice yoga.  Medicines Talk with your health care provider about medicines that might help you deal  with cravings and make quitting easier for you. Relationships Social situations can be difficult when you are quitting smoking. To manage this, you can: Avoid parties and other social situations where people might be smoking. Avoid alcohol. Leave right away if you have the urge to smoke. Explain to your family and friends that you are quitting  smoking. Ask for support and let them know you might be a bit grumpy. Plan activities where smoking is not an option. General instructions Be aware that many people gain weight after they quit smoking. However, not everyone does. To keep from gaining weight, have a plan in place before you quit, and stick to the plan after you quit. Your plan should include: Eating healthy snacks. When you have a craving, it may help to: Eat popcorn, or try carrots, celery, or other cut vegetables. Chew sugar-free gum. Changing how you eat. Eat small portion sizes at meals. Eat 4-6 small meals throughout the day instead of 1-2 large meals a day. Be mindful when you eat. You should avoid watching television or doing other things that might distract you as you eat. Exercising regularly. Make time to exercise each day. If you do not have time for a long workout, do short bouts of exercise for 5-10 minutes several times a day. Do some form of strengthening exercise, such as weight lifting. Do some exercise that gets your heart beating and causes you to breathe deeply, such as walking fast, running, swimming, or biking. This is very important. Drinking plenty of water  or other low-calorie or no-calorie drinks. Drink enough fluid to keep your urine pale yellow.  How to recognize withdrawal symptoms Your body and mind may experience discomfort as you try to get used to not having nicotine  in your system. These effects are called withdrawal symptoms. They may include: Feeling hungrier than normal. Having trouble concentrating. Feeling irritable or restless. Having trouble sleeping. Feeling depressed. Craving a cigarette. These symptoms may surprise you, but they are normal to have when quitting smoking. To manage withdrawal symptoms: Avoid places, people, and activities that trigger your cravings. Remember why you want to quit. Get plenty of sleep. Avoid coffee and other drinks that contain caffeine. These may  worsen some of your symptoms. How to manage cravings Come up with a plan for how to deal with your cravings. The plan should include the following: A definition of the specific situation you want to deal with. An activity or action you will take to replace smoking. A clear idea for how this action will help. The name of someone who could help you with this. Cravings usually last for 5-10 minutes. Consider taking the following actions to help you with your plan to deal with cravings: Keep your mouth busy. Chew sugar-free gum. Suck on hard candies or a straw. Brush your teeth. Keep your hands and body busy. Change to a different activity right away. Squeeze or play with a ball. Do an activity or a hobby, such as making bead jewelry, practicing needlepoint, or working with wood. Mix up your normal routine. Take a short exercise break. Go for a quick walk, or run up and down stairs. Focus on doing something kind or helpful for someone else. Call a friend or family member to talk during a craving. Join a support group. Contact a quitline. Where to find support To get help or find a support group: Call the National Cancer Institute's Smoking Quitline: 1-800-QUIT-NOW (239)701-2968) Text QUIT to SmokefreeTXT: 784696 Where to find more information  Visit these websites to find more information on quitting smoking: U.S. Department of Health and Human Services: www.smokefree.gov American Lung Association: www.freedomfromsmoking.org Centers for Disease Control and Prevention (CDC): FootballExhibition.com.br American Heart Association: www.heart.org Contact a health care provider if: You want to change your plan for quitting. The medicines you are taking are not helping. Your eating feels out of control or you cannot sleep. You feel depressed or become very anxious. Summary Quitting smoking is a physical and mental challenge. You will face cravings, withdrawal symptoms, and temptation to smoke again.  Preparation can help you as you go through these challenges. Try different techniques to manage stress, handle social situations, and prevent weight gain. You can deal with cravings by keeping your mouth busy (such as by chewing gum), keeping your hands and body busy, calling family or friends, or contacting a quitline for people who want to quit smoking. You can deal with withdrawal symptoms by avoiding places where people smoke, getting plenty of rest, and avoiding drinks that contain caffeine. This information is not intended to replace advice given to you by your health care provider. Make sure you discuss any questions you have with your health care provider. Document Revised: 10/11/2021 Document Reviewed: 10/11/2021 Elsevier Patient Education  2024 [employer].

## 2024-03-07 NOTE — Telephone Encounter (Signed)
 Copied from CRM [id number]. Topic: General - Other  >> Mar 07, 2024  9:00 AM Donald Frost wrote: Reason for CRM: Ava with [facility] is going to re fax over a form for the patients ostomy and incontinence supplies. Please assist further by calling [phone] if any questions

## 2024-03-07 NOTE — Progress Notes (Signed)
 @Patient  ID: Sherry Baker, female    DOB: 03-08-1966, 58 y.o.   MRN: [id number]  No chief complaint on file.   Referring provider: Collins Dean, NP  HPI: 58 year old female, current everyday smoker.  Past medical history significant for hypertension, chronic diastolic heart failure, rectal cancer as post ileostomy, chronic kidney disease, acquired hypothyroidism, bipolar affective disorder, pulmonary nodules.  Previous [facility] pulmonary encounter: 05/28/24- Dr. Thelda Finney, Consult  This is a 58 year old female, past medical history of polysubstance abuse, alcohol abuse, cocaine abuse, bipolar disease, arthritis, rectal colon cancer status post colostomy.  Patient with a history of cirrhosis.  Patient was found altered unresponsive lying in the floor, to have blood other female people present in the home.  This was concern for known high risk area of illicit activity.  Patient initially presented to the ER with alcohol level of less than 10, urine drug screen positive for cocaine.  On the floor patient has received greater than 20 mg of Ativan  today.  Appears to be in full DTs.  She does appear to be protecting her airway.  She is tachycardic.  She is moaning and flailing all extremities.  Pulmonary critical care was consulted for recommendations and management of agitated delirium and considerations for ICU admission. (Seen in hospital as a consult in 03/2021)  OV 05/28/2022: This is a 58 year old female here for evaluation of lung nodules.  Recently seen in the ER multiple times over the past couple months she is frequently evaluated there.  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  She does have a colostomy with her history of rectal cancer.  We reviewed her CT scan images and her small pulmonary nodules that I think she needs follow-up for.  She was counseled on smoking cessation.  She has smoked since she was  58 years old.  03/07/2024- Interim hx  Discussed the use of AI scribe software for clinical note transcription with the patient, who gave verbal consent to proceed.  History of Present Illness   Sherry Baker is a 58 year old female with rectal cancer and lung nodules who presents for follow-up of pulmonary nodules  History of rectal cancer status post surgery and colostomy.  Patient was hospitalized in February for urinary tract infection.  Cultures grew out ESBL Klebsiella.  Patient was started on meropenem  for 7 days.  She has a history of rectal cancer and a colostomy. In May 2023, a CT scan revealed small subsolid lesions measuring seven millimeters, which were considered inflammatory. In July 2023, she was evaluated for lung nodules, and CT scans showed small pulmonary nodules. A recent CT scan during a hospital visit for a urinary tract infection showed that the right upper lobe nodule had decreased in size, but a new five-millimeter area was noted in the right lung.  She experiences shortness of breath with exertion, such as when taking out the trash, and uses Anoro daily as well as a rescue inhaler, albuterol , as needed. She also has a cough with yellowish phlegm and occasional wheezing. No chest tightness is reported.  She has a long history of smoking since age 33 and currently smokes about seven cigarettes a day. She is currently on Zoloft  and Seroquel  for bipolar disorder.      Allergies  Allergen Reactions   Penicillins Hives, Swelling and Rash    Tolerated Unasyn , Rocephin   Did it involve  swelling of the face/tongue/throat, SOB, or low BP? Yes  Did it involve sudden or severe rash/hives, skin peeling, or any reaction on the inside of your mouth or nose? No  Did you need to seek medical attention at a hospital or doctor's office? No  When did it last happen?  <10 years    If all above answers are "NO", may proceed with cephalosporin use.  Has patient had a PCN reaction  causing immediate rash, facial/tongue/throat swelling, SOB or lightheadedness with hypotension: Yes, Has patient had a PCN reaction causing severe rash involving mucus membranes or skin necrosis: No, Has patient had a PCN reaction that required hospitalization No, Has patient had a PCN reaction occurring within the last 10 years: Yes, If all of the above answers are "NO", then may proceed with Cephalosporin use.    Immunization History  Administered Date(s) Administered   Fluad Quad(high Dose 65+) 09/30/2021   Influenza,inj,Quad PF,6+ Mos 01/17/2017, 09/14/2019   Moderna Sars-Covid-2 Vaccination 09/30/2021   PNEUMOCOCCAL CONJUGATE-20 01/07/2024   Pneumococcal Polysaccharide-23 01/17/2017   Tdap 05/25/2015, 02/02/2022, 04/09/2022    Past Medical History:  Diagnosis Date   Acquired hypothyroidism    Alcohol abuse    Allergy    PCNS swelling   Arthritis    Bipolar 1 disorder (HCC)    Chronic kidney disease    Chronic pain syndrome    Cirrhosis of liver (HCC)    Cocaine abuse (HCC)    COVID-19 virus infection 06/23/2021   Delirium tremens (HCC) 06/22/2021   Depression    Hypertension    Rectal cancer (HCC) 01/21/2017   Suicidal ideation 02/11/2014    Tobacco History: Social History   Tobacco Use  Smoking Status Every Day   Current packs/day: 1.00   Average packs/day: 1 pack/day for 47.3 years (47.3 ttl pk-yrs)   Types: Cigarettes   Start date: 11/04/1976  Smokeless Tobacco Never  Tobacco Comments   6-7 cigarettes a day   Ready to quit: Not Answered Counseling given: Not Answered Tobacco comments: 6-7 cigarettes a day   Outpatient Medications Prior to Visit  Medication Sig Dispense Refill   albuterol  (VENTOLIN  HFA) 108 (90 Base) MCG/ACT inhaler Inhale 2 puffs into the lungs every 6 (six) hours as needed for wheezing or shortness of breath. 18 g 1   amLODipine  (NORVASC ) 10 MG tablet Take 1 tablet (10 mg total) by mouth daily. 90 tablet 1   feeding supplement (ENSURE  ENLIVE / ENSURE PLUS) LIQD Take 237 mLs by mouth 3 (three) times daily between meals. 237 mL 6   hydrALAZINE  (APRESOLINE ) 100 MG tablet Take 1 tablet (100 mg total) by mouth every 8 (eight) hours. 90 tablet 1   HYDROcodone -acetaminophen  (NORCO/VICODIN) 5-325 MG tablet Take 1 tablet by mouth every 6 (six) hours as needed for severe pain (pain score 7-10). 10 tablet 0   omeprazole  (PRILOSEC) 20 MG capsule Take 20 mg by mouth daily.     ondansetron  (ZOFRAN ) 4 MG tablet Take 1 tablet (4 mg total) by mouth every 8 (eight) hours as needed for nausea or vomiting. 20 tablet 0   pantoprazole  (PROTONIX ) 40 MG tablet Take 1 tablet (40 mg total) by mouth daily. 90 tablet 1   QUEtiapine  (SEROQUEL ) 50 MG tablet Take 1 tablet (50 mg total) by mouth 2 (two) times daily. 180 tablet 1   sertraline  (ZOLOFT ) 50 MG tablet Take 1 tablet (50 mg total) by mouth daily. 90 tablet 1   sodium bicarbonate  650 MG tablet Take 1  tablet (650 mg total) by mouth daily. (Patient not taking: Reported on 01/01/2024) 60 tablet 2   umeclidinium-vilanterol (ANORO ELLIPTA ) 62.5-25 MCG/ACT AEPB Inhale 1 puff into the lungs daily. (Patient taking differently: Inhale 1 puff into the lungs daily as needed (for shortness of breath).) 60 each 2   No facility-administered medications prior to visit.    Review of Systems  Review of Systems  Constitutional: Negative.   HENT: Negative.    Eyes: Negative.   Respiratory: Negative.    Cardiovascular: Negative.    Physical Exam  There were no vitals taken for this visit. Physical Exam Constitutional:      General: She is not in acute distress.    Appearance: Normal appearance. She is not ill-appearing.  HENT:     Head: Normocephalic and atraumatic.     Mouth/Throat:     Mouth: Mucous membranes are moist.     Pharynx: Oropharynx is clear.  Cardiovascular:     Rate and Rhythm: Normal rate and regular rhythm.  Pulmonary:     Effort: Pulmonary effort is normal.     Breath sounds: Normal  breath sounds. No wheezing or rhonchi.  Neurological:     General: No focal deficit present.     Mental Status: She is alert and oriented to person, place, and time. Mental status is at baseline.  Psychiatric:        Mood and Affect: Mood normal.        Behavior: Behavior normal.        Thought Content: Thought content normal.        Judgment: Judgment normal.      Lab Results:  CBC    Component Value Date/Time   WBC 7.9 02/24/2024 1556   WBC 10.5 01/07/2024 0559   RBC 4.68 02/24/2024 1556   RBC 3.79 (L) 01/07/2024 0559   HGB 12.4 02/24/2024 1556   HGB 9.7 (L) 10/07/2017 1335   HCT 39.5 02/24/2024 1556   HCT 31.5 (L) 10/07/2017 1335   PLT 290 02/24/2024 1556   MCV 84 02/24/2024 1556   MCV 74.2 (L) 10/07/2017 1335   MCH 26.5 (L) 02/24/2024 1556   MCH 27.4 01/07/2024 0559   MCHC 31.4 (L) 02/24/2024 1556   MCHC 32.8 01/07/2024 0559   RDW 15.0 02/24/2024 1556   RDW 20.2 (H) 10/07/2017 1335   LYMPHSABS 1.9 02/24/2024 1556   LYMPHSABS 2.2 10/07/2017 1335   MONOABS 0.9 01/07/2024 0559   MONOABS 1.0 (H) 10/07/2017 1335   EOSABS 0.3 02/24/2024 1556   BASOSABS 0.0 02/24/2024 1556   BASOSABS 0.0 10/07/2017 1335    BMET    Component Value Date/Time   NA 140 02/24/2024 1556   NA 133 (L) 04/22/2017 1512   K 4.5 02/24/2024 1556   K 3.9 04/22/2017 1512   CL 105 02/24/2024 1556   CO2 17 (L) 02/24/2024 1556   CO2 24 04/22/2017 1512   GLUCOSE 95 02/24/2024 1556   GLUCOSE 88 01/10/2024 1356   GLUCOSE 79 04/22/2017 1512   BUN 23 02/24/2024 1556   BUN 9.5 04/22/2017 1512   CREATININE 2.01 (H) 02/24/2024 1556   CREATININE 0.6 04/22/2017 1512   CALCIUM  9.3 02/24/2024 1556   CALCIUM  9.4 04/22/2017 1512   GFRNONAA 38 (L) 01/10/2024 1356   GFRAA 48 (L) 11/15/2020 1153    BNP    Component Value Date/Time   BNP 114.0 (H) 01/06/2024 0523    ProBNP No results found for: "PROBNP"  Imaging: No results found.  Assessment & Plan:   1. Lung nodules (Primary) - CT Chest  High Resolution; Future - Pulmonary Function Test; Future  2. Tobacco use - CT Chest High Resolution; Future - Pulmonary Function Test; Future  Assessment and Plan    Pulmonary nodules Pulmonary nodules in the right upper lobe, previously identified as small sub-solid lesions measuring 7 mm, now appear slightly smaller. A new 5 mm area in the right lung was identified. Nodules are likely inflammatory, but due to smoking history, there is a high risk for lung cancer. High-resolution CT scan is planned to better evaluate bilateral septal thickening/fibrosis and monitor nodules. - Order high-resolution CT scan in September to evaluate fibrosis and monitor nodules  Emphysema Patient experiences dyspnea with exertion and chronic cough  - Needs pulmonary function testing - Continue Anoro Ellipta  one puff daily   Nicotine  dependence Chronic nicotine  dependence with a history of smoking since age 44. Currently smokes approximately 7 cigarettes per day. High risk for lung cancer and fibrosis due to smoking. She is not ready to quit smoking but is advised to cut back. Nicotine  gum is recommended as a replacement for cigarettes. Discussed potential use of Chantix or Wellbutrin with caution due to bipolar disorder. - Advise cutting back smoking by one cigarette per week - Recommend nicotine  gum as a replacement for cigarettes - Provide information on hypnosis, acupuncture, and quitline resources - Discuss potential use of Chantix or Wellbutrin with caution due to bipolar disorder  Bipolar affective disorder  Bipolar disorder, currently managed with Zoloft  and Seroquel . Caution advised with the use of Chantix or Wellbutrin for smoking cessation due to potential mood disturbances. Discussed the risk of mood disruption with Chantix, including potential for depression or vivid dreams. - Continue current medications Zoloft  and Seroquel  - Monitor mood if considering Chantix or Wellbutrin for smoking  cessation   Antonio Baumgarten, NP 03/07/2024

## 2024-03-08 NOTE — Telephone Encounter (Signed)
 I called [facility] and spoke to [place].  He explained that they need orders for ostomy and incontinence supplies signed by Ms Jamal Mays, NP. They have the office visit notes for 12/16/2023.  I asked that he email me the items that need signature and I will get them to the PCP

## 2024-03-08 NOTE — Telephone Encounter (Signed)
 Noted.

## 2024-03-09 NOTE — Telephone Encounter (Addendum)
 Orders for ostomy and incontinence supplies signed by Ms Jamal Mays, NP.   She noted that she is not patient's PCP and will not be seeing the patient in the future and will not be signing any other orders for supplies for the patient. The patient will need to establish care with another provider to sign orders for supplies going forward.   I tried to reach the patient: [phone] to inform her of the order status and the need to establish care with a new PCP.  I had to leave a message requesting a call back.   Signed orders efaxed to [facility]: [phone]

## 2024-03-14 ENCOUNTER — Ambulatory Visit: Payer: MEDICAID | Admitting: Nurse Practitioner

## 2024-03-15 ENCOUNTER — Encounter (HOSPITAL_BASED_OUTPATIENT_CLINIC_OR_DEPARTMENT_OTHER): Payer: MEDICAID | Attending: Internal Medicine | Admitting: Internal Medicine

## 2024-03-15 DIAGNOSIS — T8131XS Disruption of external operation (surgical) wound, not elsewhere classified, sequela: Secondary | ICD-10-CM | POA: Insufficient documentation

## 2024-03-15 DIAGNOSIS — L98492 Non-pressure chronic ulcer of skin of other sites with fat layer exposed: Secondary | ICD-10-CM | POA: Insufficient documentation

## 2024-03-15 DIAGNOSIS — L599 Disorder of the skin and subcutaneous tissue related to radiation, unspecified: Secondary | ICD-10-CM | POA: Insufficient documentation

## 2024-03-16 NOTE — Telephone Encounter (Signed)
 I called patient and spoke to her cousin, Sherry Baker, who shares the same phone as the patient.  Sherry Baker said the patient was not with her and she could not get a message to her.    I explained to Sherry Baker that Ms Sherry Mays, NP signed the recent orders for ostomy/incontinence  supplies but she will not be signing any future orders for supplies  I also explained that Ms Sherry Mays, NP is not the patient's PCP any longer and they will need to establish care with a new PCP in order to continue to receive the needed supplies/ medication/care.  Sherry Baker said that she understands.    She said they already have the ostomy supplies and received them about 2 weeks ago. She went on to say that they do not want to see Ms Sherry Mays, NP anyway because she is the reason the patient ended up in the hospital. She said they will be seeing Ms Sherry Favor, PA and I explained that Ms Sherry Baker is only a per diem provider and is not a PCP. She was upset that Sherry Baker was not a PCP and asked if we have other PCPs.  I told her that I would need to check with our front office staff about availability. She was insistent that the patient establish care with a new PCP at [facility] and if not, she will report this situation to " the board."  I told her that we will need to get back to her about scheduling that appointment.

## 2024-03-17 NOTE — Telephone Encounter (Signed)
 noted

## 2024-03-17 NOTE — Telephone Encounter (Signed)
 I will need to review her chart and speak with her PCP and get back to you.

## 2024-03-25 ENCOUNTER — Ambulatory Visit: Payer: MEDICAID | Admitting: Pharmacist

## 2024-03-30 ENCOUNTER — Encounter (HOSPITAL_BASED_OUTPATIENT_CLINIC_OR_DEPARTMENT_OTHER): Payer: MEDICAID | Admitting: General Surgery

## 2024-03-30 DIAGNOSIS — T8131XS Disruption of external operation (surgical) wound, not elsewhere classified, sequela: Secondary | ICD-10-CM | POA: Diagnosis not present

## 2024-03-30 DIAGNOSIS — L98492 Non-pressure chronic ulcer of skin of other sites with fat layer exposed: Secondary | ICD-10-CM | POA: Diagnosis present

## 2024-03-30 DIAGNOSIS — L599 Disorder of the skin and subcutaneous tissue related to radiation, unspecified: Secondary | ICD-10-CM | POA: Diagnosis not present

## 2024-04-05 ENCOUNTER — Telehealth: Payer: Self-pay

## 2024-04-05 NOTE — Telephone Encounter (Signed)
 Copied from CRM [id number]. Topic: Referral - Question >> Apr 05, 2024  1:06 PM Star East wrote:  Reason for CRM: Trevor Fudge with [facility] spoke with family member named Carmelina Chinchilla and she states that the patient would prefer to go to [facility] in [place] for wound care

## 2024-04-13 ENCOUNTER — Encounter (HOSPITAL_BASED_OUTPATIENT_CLINIC_OR_DEPARTMENT_OTHER): Payer: MEDICAID | Attending: General Surgery | Admitting: General Surgery

## 2024-04-13 DIAGNOSIS — J449 Chronic obstructive pulmonary disease, unspecified: Secondary | ICD-10-CM | POA: Insufficient documentation

## 2024-04-13 DIAGNOSIS — I129 Hypertensive chronic kidney disease with stage 1 through stage 4 chronic kidney disease, or unspecified chronic kidney disease: Secondary | ICD-10-CM | POA: Insufficient documentation

## 2024-04-13 DIAGNOSIS — L98492 Non-pressure chronic ulcer of skin of other sites with fat layer exposed: Secondary | ICD-10-CM | POA: Insufficient documentation

## 2024-04-13 DIAGNOSIS — Z9221 Personal history of antineoplastic chemotherapy: Secondary | ICD-10-CM | POA: Diagnosis not present

## 2024-04-13 DIAGNOSIS — L599 Disorder of the skin and subcutaneous tissue related to radiation, unspecified: Secondary | ICD-10-CM | POA: Insufficient documentation

## 2024-04-13 DIAGNOSIS — T8131XA Disruption of external operation (surgical) wound, not elsewhere classified, initial encounter: Secondary | ICD-10-CM | POA: Diagnosis not present

## 2024-04-13 DIAGNOSIS — Z87440 Personal history of urinary (tract) infections: Secondary | ICD-10-CM | POA: Diagnosis not present

## 2024-04-13 DIAGNOSIS — Z85048 Personal history of other malignant neoplasm of rectum, rectosigmoid junction, and anus: Secondary | ICD-10-CM | POA: Diagnosis not present

## 2024-04-13 DIAGNOSIS — N1832 Chronic kidney disease, stage 3b: Secondary | ICD-10-CM | POA: Diagnosis not present

## 2024-04-13 DIAGNOSIS — Y838 Other surgical procedures as the cause of abnormal reaction of the patient, or of later complication, without mention of misadventure at the time of the procedure: Secondary | ICD-10-CM | POA: Diagnosis not present

## 2024-04-26 ENCOUNTER — Encounter: Payer: Self-pay | Admitting: Pharmacist

## 2024-04-26 ENCOUNTER — Ambulatory Visit: Payer: MEDICAID | Attending: Critical Care Medicine | Admitting: Pharmacist

## 2024-04-26 VITALS — BP 113/69 | HR 87

## 2024-04-26 DIAGNOSIS — I10 Essential (primary) hypertension: Secondary | ICD-10-CM | POA: Diagnosis not present

## 2024-04-26 DIAGNOSIS — Z7189 Other specified counseling: Secondary | ICD-10-CM | POA: Diagnosis not present

## 2024-04-26 MED ORDER — SODIUM BICARBONATE 650 MG PO TABS: 650.0000 mg | ORAL_TABLET | Freq: Every day | ORAL | 2 refills | Status: AC

## 2024-04-26 MED ORDER — UMECLIDINIUM-VILANTEROL 62.5-25 MCG/ACT IN AEPB: 1.0000 | INHALATION_SPRAY | Freq: Every day | RESPIRATORY_TRACT | 2 refills | Status: AC

## 2024-04-26 NOTE — Progress Notes (Signed)
   S:    PCP: Dr. Delbert  PMH: HTN, CHF (last EF in 2023 40-45%)-3, GERD, hx of polysubstance abuse (cocaine, EtOH - currently 6 months sober), bipolar 1, depression.     Patient arrives in good spirits. Presents to the clinic for hypertension evaluation, counseling, and management.   Patient was referred and last seen by Jon Moores on 02/24/2024. Pt last seen by pharmacy in 2022. She used to struggle with HTN. Thankfully, she reports that she has been sober for ~6 months. She brings in her medications today for a medication reconciliation. BP was Jon in April was 115/72 mmHg.   Today, patient reports adherence to amlodipine  but this is all she takes for HTN at this time. She brings in all of her medication bottles for reconciliation. I have updated her profile in accordance to this.    Social history:  - current smoker (0.5 ppd x39 years) - illicit drugs: sober for 6 months  - no recent alcohol consumption   Family History: - mother and maternal aunt: cancer  Medication adherence reported. She has taken amlodipine  today.  Current BP Medications include:  amlodipine  10 mg daily   Dietary habits include:  - compliant with sodium restriction  - denies excessive intake of caffeine   O:  Vitals:   04/26/24 1534  BP: 113/69  Pulse: 87   Home BP readings: none  Last 3 Office BP readings: BP Readings from Last 3 Encounters:  04/26/24 113/69  03/07/24 (!) 102/59  02/24/24 115/72    BMET    Component Value Date/Time   NA 140 02/24/2024 1556   NA 133 (L) 04/22/2017 1512   K 4.5 02/24/2024 1556   K 3.9 04/22/2017 1512   CL 105 02/24/2024 1556   CO2 17 (L) 02/24/2024 1556   CO2 24 04/22/2017 1512   GLUCOSE 95 02/24/2024 1556   GLUCOSE 88 01/10/2024 1356   GLUCOSE 79 04/22/2017 1512   BUN 23 02/24/2024 1556   BUN 9.5 04/22/2017 1512   CREATININE 2.01 (H) 02/24/2024 1556   CREATININE 0.6 04/22/2017 1512   CALCIUM  9.3 02/24/2024 1556   CALCIUM  9.4 04/22/2017 1512    GFRNONAA 38 (L) 01/10/2024 1356   GFRAA 48 (L) 11/15/2020 1153    Renal function: CrCl cannot be calculated (Patient's most recent lab result is older than the maximum 21 days allowed.).  Clinical ASCVD: No  The ASCVD Risk score (Arnett DK, et al., 2019) failed to calculate for the following reasons:   Cannot find a previous HDL lab   Cannot find a previous total cholesterol lab   A/P: Hypertension longstanding currently at goal on amlodipine . BP Goal <130/80 mmHg. Medication adherence reported. -Continued amlodipine  10 mg daily -Medication reconciliation completed. Medication list in our EMR is now accurate to what she is taking.  -Counseled on lifestyle modifications for blood pressure control including reduced dietary sodium, increased exercise, adequate sleep.  Results reviewed and written information provided. Total time in face-to-face counseling 25 minutes.   F/U Clinic Visit in 2 weeks.   Herlene Fleeta Morris, PharmD, JAQUELINE, CPP Clinical Pharmacist [facility] [phone]

## 2024-05-02 ENCOUNTER — Encounter (HOSPITAL_BASED_OUTPATIENT_CLINIC_OR_DEPARTMENT_OTHER): Payer: MEDICAID | Admitting: General Surgery
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
## 2024-05-12 ENCOUNTER — Ambulatory Visit: Payer: MEDICAID | Admitting: Critical Care Medicine

## 2024-06-09 ENCOUNTER — Encounter: Payer: Self-pay | Admitting: Internal Medicine

## 2024-07-06 ENCOUNTER — Other Ambulatory Visit: Payer: Self-pay | Admitting: Internal Medicine

## 2024-07-06 ENCOUNTER — Ambulatory Visit
Admission: RE | Admit: 2024-07-06 | Discharge: 2024-07-06 | Disposition: A | Discharge status: Home / Self Care | Source: Ambulatory Visit | Attending: Internal Medicine | Admitting: Internal Medicine

## 2024-07-06 DIAGNOSIS — G8929 Other chronic pain: Secondary | ICD-10-CM

## 2024-07-08 ENCOUNTER — Ambulatory Visit
Admission: RE | Admit: 2024-07-08 | Discharge: 2024-07-08 | Disposition: A | Discharge status: Home / Self Care | Payer: MEDICAID | Source: Ambulatory Visit | Attending: Primary Care | Admitting: Primary Care

## 2024-07-08 ENCOUNTER — Other Ambulatory Visit: Payer: Self-pay | Admitting: Internal Medicine

## 2024-07-08 DIAGNOSIS — Z85048 Personal history of other malignant neoplasm of rectum, rectosigmoid junction, and anus: Secondary | ICD-10-CM

## 2024-07-08 DIAGNOSIS — R918 Other nonspecific abnormal finding of lung field: Secondary | ICD-10-CM

## 2024-07-08 DIAGNOSIS — Z923 Personal history of irradiation: Secondary | ICD-10-CM

## 2024-07-08 DIAGNOSIS — Z720 Tobacco use: Secondary | ICD-10-CM

## 2024-07-08 DIAGNOSIS — N321 Vesicointestinal fistula: Secondary | ICD-10-CM

## 2024-07-08 DIAGNOSIS — Z932 Ileostomy status: Secondary | ICD-10-CM

## 2024-07-12 ENCOUNTER — Ambulatory Visit: Payer: Self-pay | Admitting: Primary Care

## 2024-07-12 NOTE — Progress Notes (Signed)
 Please let patient know CT chest showed unchanged mild pulmonary fibrosis, mild emphysema, unchanged small pulmonary nodule right apex

## 2024-07-13 ENCOUNTER — Encounter: Payer: Self-pay | Admitting: *Deleted

## 2024-07-13 NOTE — Progress Notes (Signed)
 ATC x1.  LVM for patient to return call.  Mychart message sent as well.

## 2024-07-14 NOTE — Progress Notes (Signed)
 ATC X2. Lmtcb. I will send pt a mychart message then completing note. NFN

## 2024-07-19 ENCOUNTER — Other Ambulatory Visit: Payer: Self-pay | Admitting: Internal Medicine

## 2024-07-19 ENCOUNTER — Ambulatory Visit
Admission: RE | Admit: 2024-07-19 | Discharge: 2024-07-19 | Disposition: A | Discharge status: Home / Self Care | Source: Ambulatory Visit | Attending: Internal Medicine | Admitting: Internal Medicine

## 2024-07-19 DIAGNOSIS — Z923 Personal history of irradiation: Secondary | ICD-10-CM

## 2024-07-19 DIAGNOSIS — N321 Vesicointestinal fistula: Secondary | ICD-10-CM

## 2024-07-19 DIAGNOSIS — Z932 Ileostomy status: Secondary | ICD-10-CM

## 2024-07-19 DIAGNOSIS — Z85048 Personal history of other malignant neoplasm of rectum, rectosigmoid junction, and anus: Secondary | ICD-10-CM

## 2024-08-24 ENCOUNTER — Other Ambulatory Visit: Payer: Self-pay | Admitting: Internal Medicine

## 2024-08-24 ENCOUNTER — Other Ambulatory Visit: Payer: Self-pay

## 2024-08-24 DIAGNOSIS — R911 Solitary pulmonary nodule: Secondary | ICD-10-CM

## 2024-08-24 DIAGNOSIS — F17200 Nicotine dependence, unspecified, uncomplicated: Secondary | ICD-10-CM

## 2024-08-24 DIAGNOSIS — Z1231 Encounter for screening mammogram for malignant neoplasm of breast: Secondary | ICD-10-CM

## 2024-11-08 ENCOUNTER — Ambulatory Visit

## 2025-01-19 ENCOUNTER — Ambulatory Visit
